# Patient Record
Sex: Male | Born: 1965
Health system: Southern US, Community
[De-identification: ages and names within clinical notes are randomized; demographics above are authoritative.]

## PROBLEM LIST (undated history)

## (undated) DIAGNOSIS — Z21 Asymptomatic human immunodeficiency virus [HIV] infection status: Secondary | ICD-10-CM

## (undated) DIAGNOSIS — J45909 Unspecified asthma, uncomplicated: Secondary | ICD-10-CM

## (undated) DIAGNOSIS — E785 Hyperlipidemia, unspecified: Secondary | ICD-10-CM

## (undated) DIAGNOSIS — E039 Hypothyroidism, unspecified: Secondary | ICD-10-CM

## (undated) DIAGNOSIS — I5032 Chronic diastolic (congestive) heart failure: Secondary | ICD-10-CM

## (undated) DIAGNOSIS — M1990 Unspecified osteoarthritis, unspecified site: Secondary | ICD-10-CM

## (undated) DIAGNOSIS — R011 Cardiac murmur, unspecified: Secondary | ICD-10-CM

## (undated) DIAGNOSIS — I340 Nonrheumatic mitral (valve) insufficiency: Secondary | ICD-10-CM

## (undated) DIAGNOSIS — G4733 Obstructive sleep apnea (adult) (pediatric): Secondary | ICD-10-CM

## (undated) DIAGNOSIS — I499 Cardiac arrhythmia, unspecified: Secondary | ICD-10-CM

## (undated) DIAGNOSIS — C801 Malignant (primary) neoplasm, unspecified: Secondary | ICD-10-CM

## (undated) DIAGNOSIS — K219 Gastro-esophageal reflux disease without esophagitis: Secondary | ICD-10-CM

## (undated) DIAGNOSIS — G629 Polyneuropathy, unspecified: Secondary | ICD-10-CM

## (undated) DIAGNOSIS — R112 Nausea with vomiting, unspecified: Secondary | ICD-10-CM

## (undated) DIAGNOSIS — I639 Cerebral infarction, unspecified: Secondary | ICD-10-CM

## (undated) DIAGNOSIS — I110 Hypertensive heart disease with heart failure: Secondary | ICD-10-CM

## (undated) DIAGNOSIS — K209 Esophagitis, unspecified without bleeding: Secondary | ICD-10-CM

## (undated) DIAGNOSIS — M109 Gout, unspecified: Secondary | ICD-10-CM

## (undated) DIAGNOSIS — I10 Essential (primary) hypertension: Secondary | ICD-10-CM

## (undated) DIAGNOSIS — Z9289 Personal history of other medical treatment: Secondary | ICD-10-CM

## (undated) DIAGNOSIS — Z9889 Other specified postprocedural states: Secondary | ICD-10-CM

## (undated) DIAGNOSIS — B20 Human immunodeficiency virus [HIV] disease: Secondary | ICD-10-CM

## (undated) DIAGNOSIS — D649 Anemia, unspecified: Secondary | ICD-10-CM

## (undated) DIAGNOSIS — K759 Inflammatory liver disease, unspecified: Secondary | ICD-10-CM

## (undated) DIAGNOSIS — Z992 Dependence on renal dialysis: Secondary | ICD-10-CM

## (undated) DIAGNOSIS — E079 Disorder of thyroid, unspecified: Secondary | ICD-10-CM

## (undated) DIAGNOSIS — Z972 Presence of dental prosthetic device (complete) (partial): Secondary | ICD-10-CM

## (undated) DIAGNOSIS — I219 Acute myocardial infarction, unspecified: Secondary | ICD-10-CM

## (undated) DIAGNOSIS — Z973 Presence of spectacles and contact lenses: Secondary | ICD-10-CM

## (undated) DIAGNOSIS — I700 Atherosclerosis of aorta: Secondary | ICD-10-CM

## (undated) DIAGNOSIS — N186 End stage renal disease: Secondary | ICD-10-CM

## (undated) DIAGNOSIS — N189 Chronic kidney disease, unspecified: Secondary | ICD-10-CM

## (undated) DIAGNOSIS — G4730 Sleep apnea, unspecified: Secondary | ICD-10-CM

## (undated) HISTORY — DX: Gastro-esophageal reflux disease without esophagitis: K21.9

## (undated) HISTORY — DX: Cardiac murmur, unspecified: R01.1

## (undated) HISTORY — PX: NOSE SURGERY: SHX723

## (undated) HISTORY — PX: HERNIA REPAIR: SHX51

## (undated) HISTORY — DX: Personal history of other medical treatment: Z92.89

## (undated) HISTORY — PX: MULTIPLE TOOTH EXTRACTIONS: SHX2053

## (undated) HISTORY — DX: Other specified postprocedural states: Z98.890

## (undated) HISTORY — PX: CHOLECYSTECTOMY: SHX55

## (undated) HISTORY — DX: Chronic diastolic (congestive) heart failure: I50.32

## (undated) HISTORY — DX: Nonrheumatic mitral (valve) insufficiency: I34.0

## (undated) HISTORY — DX: Unspecified asthma, uncomplicated: J45.909

## (undated) HISTORY — DX: Sleep apnea, unspecified: G47.30

## (undated) HISTORY — PX: RENAL BIOPSY: SHX156

## (undated) HISTORY — PX: COLONOSCOPY W/ BIOPSIES AND POLYPECTOMY: SHX1376

## (undated) HISTORY — DX: Essential (primary) hypertension: I10

## (undated) HISTORY — DX: Anemia, unspecified: D64.9

## (undated) HISTORY — DX: Atherosclerosis of aorta: I70.0

## (undated) HISTORY — DX: Hypertensive heart disease with heart failure: I11.0

## (undated) HISTORY — DX: Malignant (primary) neoplasm, unspecified: C80.1

## (undated) HISTORY — DX: Esophagitis, unspecified without bleeding: K20.90

## (undated) HISTORY — DX: Unspecified osteoarthritis, unspecified site: M19.90

## (undated) HISTORY — DX: Nausea with vomiting, unspecified: R11.2

## (undated) HISTORY — DX: Disorder of thyroid, unspecified: E07.9

---

## 2015-05-04 DIAGNOSIS — R9431 Abnormal electrocardiogram [ECG] [EKG]: Secondary | ICD-10-CM | POA: Insufficient documentation

## 2015-05-04 DIAGNOSIS — I340 Nonrheumatic mitral (valve) insufficiency: Secondary | ICD-10-CM | POA: Insufficient documentation

## 2015-05-04 DIAGNOSIS — I10 Essential (primary) hypertension: Secondary | ICD-10-CM | POA: Insufficient documentation

## 2015-05-04 DIAGNOSIS — B20 Human immunodeficiency virus [HIV] disease: Secondary | ICD-10-CM | POA: Insufficient documentation

## 2015-05-08 DIAGNOSIS — I2721 Secondary pulmonary arterial hypertension: Secondary | ICD-10-CM | POA: Insufficient documentation

## 2015-05-15 DIAGNOSIS — I5030 Unspecified diastolic (congestive) heart failure: Secondary | ICD-10-CM | POA: Insufficient documentation

## 2015-05-29 DIAGNOSIS — M549 Dorsalgia, unspecified: Secondary | ICD-10-CM | POA: Insufficient documentation

## 2015-05-29 DIAGNOSIS — M545 Low back pain, unspecified: Secondary | ICD-10-CM | POA: Insufficient documentation

## 2016-05-14 DIAGNOSIS — N186 End stage renal disease: Secondary | ICD-10-CM | POA: Insufficient documentation

## 2016-10-09 ENCOUNTER — Observation Stay (HOSPITAL_COMMUNITY): Payer: Medicaid Other

## 2016-10-09 ENCOUNTER — Inpatient Hospital Stay (HOSPITAL_COMMUNITY)
Admission: AD | Admit: 2016-10-09 | Discharge: 2016-10-11 | DRG: 304 | Disposition: A | Discharge status: Home / Self Care | Payer: Medicaid Other | Source: Other Acute Inpatient Hospital | Attending: Internal Medicine | Admitting: Internal Medicine

## 2016-10-09 ENCOUNTER — Encounter (HOSPITAL_COMMUNITY): Payer: Self-pay | Admitting: Internal Medicine

## 2016-10-09 DIAGNOSIS — Z23 Encounter for immunization: Secondary | ICD-10-CM | POA: Diagnosis not present

## 2016-10-09 DIAGNOSIS — I43 Cardiomyopathy in diseases classified elsewhere: Secondary | ICD-10-CM | POA: Diagnosis present

## 2016-10-09 DIAGNOSIS — Z87891 Personal history of nicotine dependence: Secondary | ICD-10-CM

## 2016-10-09 DIAGNOSIS — Z7982 Long term (current) use of aspirin: Secondary | ICD-10-CM | POA: Diagnosis not present

## 2016-10-09 DIAGNOSIS — I129 Hypertensive chronic kidney disease with stage 1 through stage 4 chronic kidney disease, or unspecified chronic kidney disease: Secondary | ICD-10-CM

## 2016-10-09 DIAGNOSIS — Z79899 Other long term (current) drug therapy: Secondary | ICD-10-CM | POA: Diagnosis not present

## 2016-10-09 DIAGNOSIS — I160 Hypertensive urgency: Principal | ICD-10-CM | POA: Diagnosis present

## 2016-10-09 DIAGNOSIS — R079 Chest pain, unspecified: Secondary | ICD-10-CM | POA: Diagnosis present

## 2016-10-09 DIAGNOSIS — B20 Human immunodeficiency virus [HIV] disease: Secondary | ICD-10-CM | POA: Diagnosis not present

## 2016-10-09 DIAGNOSIS — I5033 Acute on chronic diastolic (congestive) heart failure: Secondary | ICD-10-CM | POA: Diagnosis not present

## 2016-10-09 DIAGNOSIS — G4733 Obstructive sleep apnea (adult) (pediatric): Secondary | ICD-10-CM | POA: Diagnosis present

## 2016-10-09 DIAGNOSIS — I208 Other forms of angina pectoris: Secondary | ICD-10-CM

## 2016-10-09 DIAGNOSIS — I340 Nonrheumatic mitral (valve) insufficiency: Secondary | ICD-10-CM | POA: Diagnosis not present

## 2016-10-09 DIAGNOSIS — E876 Hypokalemia: Secondary | ICD-10-CM | POA: Diagnosis present

## 2016-10-09 DIAGNOSIS — R0781 Pleurodynia: Secondary | ICD-10-CM | POA: Diagnosis present

## 2016-10-09 DIAGNOSIS — E785 Hyperlipidemia, unspecified: Secondary | ICD-10-CM | POA: Diagnosis present

## 2016-10-09 DIAGNOSIS — N184 Chronic kidney disease, stage 4 (severe): Secondary | ICD-10-CM | POA: Diagnosis not present

## 2016-10-09 DIAGNOSIS — N179 Acute kidney failure, unspecified: Secondary | ICD-10-CM | POA: Diagnosis present

## 2016-10-09 DIAGNOSIS — I10 Essential (primary) hypertension: Secondary | ICD-10-CM

## 2016-10-09 DIAGNOSIS — I509 Heart failure, unspecified: Secondary | ICD-10-CM

## 2016-10-09 DIAGNOSIS — I130 Hypertensive heart and chronic kidney disease with heart failure and stage 1 through stage 4 chronic kidney disease, or unspecified chronic kidney disease: Secondary | ICD-10-CM | POA: Diagnosis present

## 2016-10-09 DIAGNOSIS — I110 Hypertensive heart disease with heart failure: Secondary | ICD-10-CM | POA: Diagnosis present

## 2016-10-09 HISTORY — DX: Hyperlipidemia, unspecified: E78.5

## 2016-10-09 HISTORY — DX: Hypertensive heart disease with heart failure: I11.0

## 2016-10-09 HISTORY — DX: Asymptomatic human immunodeficiency virus (hiv) infection status: Z21

## 2016-10-09 HISTORY — DX: Obstructive sleep apnea (adult) (pediatric): G47.33

## 2016-10-09 HISTORY — DX: Gout, unspecified: M10.9

## 2016-10-09 HISTORY — DX: Chronic kidney disease, unspecified: N18.9

## 2016-10-09 HISTORY — DX: Human immunodeficiency virus (HIV) disease: B20

## 2016-10-09 LAB — BASIC METABOLIC PANEL
Anion gap: 9 (ref 5–15)
BUN: 23 mg/dL — AB (ref 6–20)
CO2: 32 mmol/L (ref 22–32)
CREATININE: 2.45 mg/dL — AB (ref 0.61–1.24)
Calcium: 8.9 mg/dL (ref 8.9–10.3)
Chloride: 99 mmol/L — ABNORMAL LOW (ref 101–111)
GFR calc Af Amer: 34 mL/min — ABNORMAL LOW (ref >60)
GFR calc non Af Amer: 29 mL/min — ABNORMAL LOW (ref >60)
GLUCOSE: 96 mg/dL (ref 65–99)
Potassium: 2.7 mmol/L — CL (ref 3.5–5.1)
Sodium: 140 mmol/L (ref 135–145)

## 2016-10-09 LAB — CBC
HEMATOCRIT: 35.9 % — AB (ref 39.0–52.0)
HEMOGLOBIN: 12.6 g/dL — AB (ref 13.0–17.0)
MCH: 31 pg (ref 26.0–34.0)
MCHC: 35.1 g/dL (ref 30.0–36.0)
MCV: 88.2 fL (ref 78.0–100.0)
Platelets: 195 10*3/uL (ref 150–400)
RBC: 4.07 MIL/uL — ABNORMAL LOW (ref 4.22–5.81)
RDW: 13.8 % (ref 11.5–15.5)
WBC: 5 10*3/uL (ref 4.0–10.5)

## 2016-10-09 LAB — TROPONIN I
TROPONIN I: 0.13 ng/mL — AB (ref <0.03)
TROPONIN I: 0.11 ng/mL — AB (ref <0.03)
Troponin I: 0.11 ng/mL (ref <0.03)

## 2016-10-09 LAB — LIPID PANEL
CHOLESTEROL: 161 mg/dL (ref 0–200)
HDL: 56 mg/dL (ref >40)
LDL CALC: 85 mg/dL (ref 0–99)
TRIGLYCERIDES: 99 mg/dL (ref <150)
Total CHOL/HDL Ratio: 2.9 RATIO
VLDL: 20 mg/dL (ref 0–40)

## 2016-10-09 LAB — BRAIN NATRIURETIC PEPTIDE: B Natriuretic Peptide: 454.6 pg/mL — ABNORMAL HIGH (ref 0.0–100.0)

## 2016-10-09 LAB — D-DIMER, QUANTITATIVE: D-Dimer, Quant: 0.36 ug/mL-FEU (ref 0.00–0.50)

## 2016-10-09 LAB — ECHOCARDIOGRAM COMPLETE
Height: 69 in
Weight: 3428.59 oz

## 2016-10-09 LAB — MRSA PCR SCREENING: MRSA BY PCR: NEGATIVE

## 2016-10-09 MED ORDER — HYDRALAZINE HCL 50 MG PO TABS
100.0000 mg | ORAL_TABLET | Freq: Three times a day (TID) | ORAL | Status: DC
  Administered 2016-10-09 – 2016-10-11 (×7): 100 mg via ORAL
  Filled 2016-10-09 (×7): qty 2

## 2016-10-09 MED ORDER — LAMIVUDINE 150 MG PO TABS
150.0000 mg | ORAL_TABLET | Freq: Every day | ORAL | Status: DC
  Administered 2016-10-09 – 2016-10-11 (×3): 150 mg via ORAL
  Filled 2016-10-09 (×3): qty 1

## 2016-10-09 MED ORDER — ABACAVIR SULFATE 300 MG PO TABS
300.0000 mg | ORAL_TABLET | Freq: Every day | ORAL | Status: DC
  Administered 2016-10-09 – 2016-10-11 (×3): 300 mg via ORAL
  Filled 2016-10-09 (×3): qty 1

## 2016-10-09 MED ORDER — ACETAMINOPHEN 325 MG PO TABS
650.0000 mg | ORAL_TABLET | ORAL | Status: DC | PRN
  Administered 2016-10-09 – 2016-10-10 (×3): 650 mg via ORAL
  Filled 2016-10-09 (×3): qty 2

## 2016-10-09 MED ORDER — LOSARTAN POTASSIUM 50 MG PO TABS: 100.0000 mg | ORAL_TABLET | Freq: Every day | ORAL | Status: DC

## 2016-10-09 MED ORDER — CARVEDILOL 25 MG PO TABS
25.0000 mg | ORAL_TABLET | Freq: Two times a day (BID) | ORAL | Status: DC
  Administered 2016-10-09 – 2016-10-11 (×4): 25 mg via ORAL
  Filled 2016-10-09 (×4): qty 1

## 2016-10-09 MED ORDER — ATORVASTATIN CALCIUM 40 MG PO TABS
40.0000 mg | ORAL_TABLET | Freq: Every day | ORAL | Status: DC
  Administered 2016-10-09 – 2016-10-10 (×2): 40 mg via ORAL
  Filled 2016-10-09 (×2): qty 1

## 2016-10-09 MED ORDER — POTASSIUM CHLORIDE CRYS ER 20 MEQ PO TBCR
40.0000 meq | EXTENDED_RELEASE_TABLET | Freq: Once | ORAL | Status: AC
  Administered 2016-10-09 (×2): 20 meq via ORAL
  Filled 2016-10-09: qty 2

## 2016-10-09 MED ORDER — HEPARIN SODIUM (PORCINE) 5000 UNIT/ML IJ SOLN
5000.0000 [IU] | Freq: Three times a day (TID) | INTRAMUSCULAR | Status: DC
  Administered 2016-10-09 – 2016-10-10 (×4): 5000 [IU] via SUBCUTANEOUS
  Filled 2016-10-09 (×5): qty 1

## 2016-10-09 MED ORDER — INFLUENZA VAC SPLIT QUAD 0.5 ML IM SUSY
0.5000 mL | PREFILLED_SYRINGE | INTRAMUSCULAR | Status: AC
  Administered 2016-10-10: 0.5 mL via INTRAMUSCULAR
  Filled 2016-10-09: qty 0.5

## 2016-10-09 MED ORDER — ALUM & MAG HYDROXIDE-SIMETH 200-200-20 MG/5ML PO SUSP
30.0000 mL | Freq: Four times a day (QID) | ORAL | Status: DC | PRN
  Administered 2016-10-09: 30 mL via ORAL
  Filled 2016-10-09: qty 30

## 2016-10-09 MED ORDER — RALTEGRAVIR POTASSIUM 400 MG PO TABS
400.0000 mg | ORAL_TABLET | Freq: Two times a day (BID) | ORAL | Status: DC
  Administered 2016-10-09 – 2016-10-11 (×5): 400 mg via ORAL
  Filled 2016-10-09 (×5): qty 1

## 2016-10-09 MED ORDER — DIPHENHYDRAMINE HCL 25 MG PO CAPS
25.0000 mg | ORAL_CAPSULE | Freq: Every evening | ORAL | Status: DC | PRN
  Filled 2016-10-09: qty 1

## 2016-10-09 MED ORDER — MORPHINE SULFATE (PF) 2 MG/ML IV SOLN: 2.0000 mg | INTRAVENOUS | Status: DC | PRN

## 2016-10-09 MED ORDER — NITROGLYCERIN IN D5W 200-5 MCG/ML-% IV SOLN
0.0000 ug/min | INTRAVENOUS | Status: DC
  Administered 2016-10-09: 5 ug/min via INTRAVENOUS
  Filled 2016-10-09: qty 250

## 2016-10-09 MED ORDER — ONDANSETRON HCL 4 MG/2ML IJ SOLN: 4.0000 mg | Freq: Four times a day (QID) | INTRAMUSCULAR | Status: DC | PRN

## 2016-10-09 MED ORDER — POTASSIUM CHLORIDE CRYS ER 20 MEQ PO TBCR
40.0000 meq | EXTENDED_RELEASE_TABLET | ORAL | Status: AC
  Administered 2016-10-09 (×2): 40 meq via ORAL
  Filled 2016-10-09 (×2): qty 2

## 2016-10-09 MED ORDER — FUROSEMIDE 40 MG PO TABS
40.0000 mg | ORAL_TABLET | Freq: Two times a day (BID) | ORAL | Status: DC
  Administered 2016-10-09 – 2016-10-10 (×3): 40 mg via ORAL
  Filled 2016-10-09 (×3): qty 1

## 2016-10-09 MED ORDER — AMLODIPINE BESYLATE 10 MG PO TABS
10.0000 mg | ORAL_TABLET | Freq: Every day | ORAL | Status: DC
  Administered 2016-10-09 – 2016-10-11 (×3): 10 mg via ORAL
  Filled 2016-10-09 (×3): qty 1

## 2016-10-09 MED ORDER — ASPIRIN EC 325 MG PO TBEC
325.0000 mg | DELAYED_RELEASE_TABLET | Freq: Every day | ORAL | Status: DC
  Administered 2016-10-09 – 2016-10-11 (×3): 325 mg via ORAL
  Filled 2016-10-09 (×3): qty 1

## 2016-10-09 NOTE — H&P (Signed)
History and Physical    Albert Eaton AOZ:[id number] DOB: 02-22-1966 DOA: (Not on file)   PCP: No primary care provider on file. No PCP Chief Complaint: Chest Pain  HPI: Albert Eaton is a 50 y.o. male with medical history significant of CHF, HTN, HIV, HLD.  Chest pain onset on 12/12.  Presented to ED on 12/13 with persistent chest pain.  Associated SOB.  L sided CP with radiation to L shoulder.  Nothing specific made it better or worse.  Nitro paste also didn't really help.  Pain is currently 4/10.  ED Course: Trop 0.07.  EKG shows some ST segment depressions, no prior available for comparison.  Creat 2.73.  Does have known CKD, unclear how severe at baseline.  Review of Systems: As per HPI otherwise 10 point review of systems negative.    Past Medical History:  Diagnosis Date  . CHF (congestive heart failure) ([place])   . CKD (chronic kidney disease)   . Gout   . HIV (human immunodeficiency virus infection) ([place])   . HLD (hyperlipidemia)   . HTN (hypertension)   . OSA (obstructive sleep apnea)     Past Surgical History:  Procedure Laterality Date  . CHOLECYSTECTOMY    . HERNIA REPAIR     As baby     reports that he quit smoking about 17 years ago. He does not have any smokeless tobacco history on file. He reports that he drinks alcohol. He reports that he does not use drugs.  Allergies not on file  Family History  Problem Relation Age of Onset  . Heart failure Mother   . Heart attack Maternal Grandmother       Prior to Admission medications   Not on File    Physical Exam: Vitals:   10/09/16 0415 10/09/16 0456  BP: (!) 167/119 (!) 160/117  Pulse: 89   Temp: 98.7 F (37.1 C)   TempSrc: Oral   SpO2: 96%   Weight: 97.2 kg (214 lb 3.2 oz)   Height: 5' 10"  (1.778 m)      Constitutional: NAD, calm, comfortable Eyes: PERRL, lids and conjunctivae normal ENMT: Mucous membranes are moist. Posterior pharynx clear of any exudate or lesions.Normal  dentition.  Neck: normal, supple, no masses, no thyromegaly Respiratory: clear to auscultation bilaterally, no wheezing, no crackles. Normal respiratory effort. No accessory muscle use.  Cardiovascular: Regular rate and rhythm, no murmurs / rubs / gallops. No extremity edema. 2+ pedal pulses. No carotid bruits.  Abdomen: no tenderness, no masses palpated. No hepatosplenomegaly. Bowel sounds positive.  Musculoskeletal: no clubbing / cyanosis. No joint deformity upper and lower extremities. Good ROM, no contractures. Normal muscle tone.  Skin: no rashes, lesions, ulcers. No induration Neurologic: CN 2-12 grossly intact. Sensation intact, DTR normal. Strength 5/5 in all 4.  Psychiatric: Normal judgment and insight. Alert and oriented x 3. Normal mood.    Labs on Admission: I have personally reviewed following labs and imaging studies  CBC: No results for input(s): WBC, NEUTROABS, HGB, HCT, MCV, PLT in the last 168 hours. Basic Metabolic Panel: No results for input(s): NA, K, CL, CO2, GLUCOSE, BUN, CREATININE, CALCIUM, MG, PHOS in the last 168 hours. GFR: CrCl cannot be calculated (No order found.). Liver Function Tests: No results for input(s): AST, ALT, ALKPHOS, BILITOT, PROT, ALBUMIN in the last 168 hours. No results for input(s): LIPASE, AMYLASE in the last 168 hours. No results for input(s): AMMONIA in the last 168 hours. Coagulation Profile: No results for input(s):  INR, PROTIME in the last 168 hours. Cardiac Enzymes: No results for input(s): CKTOTAL, CKMB, CKMBINDEX, TROPONINI in the last 168 hours. BNP (last 3 results) No results for input(s): PROBNP in the last 8760 hours. HbA1C: No results for input(s): HGBA1C in the last 72 hours. CBG: No results for input(s): GLUCAP in the last 168 hours. Lipid Profile: No results for input(s): CHOL, HDL, LDLCALC, TRIG, CHOLHDL, LDLDIRECT in the last 72 hours. Thyroid Function Tests: No results for input(s): TSH, T4TOTAL, FREET4, T3FREE,  THYROIDAB in the last 72 hours. Anemia Panel: No results for input(s): VITAMINB12, FOLATE, FERRITIN, TIBC, IRON, RETICCTPCT in the last 72 hours. Urine analysis: No results found for: COLORURINE, APPEARANCEUR, LABSPEC, PHURINE, GLUCOSEU, HGBUR, BILIRUBINUR, KETONESUR, PROTEINUR, UROBILINOGEN, NITRITE, LEUKOCYTESUR Sepsis Labs: @LABRCNTIP (procalcitonin:4,lacticidven:4) )No results found for this or any previous visit (from the past 240 hour(s)).   Radiological Exams on Admission: No results found.  EKG: Independently reviewed.  Assessment/Plan Principal Problem:   Chest pain Active Problems:   HIV disease ([place])   CKD stage 4 secondary to hypertension ([place])   Malignant hypertension    1. Chest Pain - ischemic vs hypertensive urgency 1. CP obs pathway 2. NTG gtt for pain and BP control 3. NPO until trop comes back 4. Likely warrants cards eval in AM 5. Repeat EKG done 6. Getting a baseline 2d echo given both his reported history of CHF, and the fact that EKG today pretty clearly shows LVH.  (I assume patient has hypertensive cardiomyopathy, but we should confirm this). 2. HTN - 1. Continue BP meds 2. Except will hold losartan due to creat of 2.7 which puts his EGFR at 26 3. Nitro gtt 4. Despite not missing any BP meds last evening, his BP was as high as 199/136 at [facility], and diastolic remains high at 893 here. 3. CKD - appears to be stage 4 right now, from the sounds of what the patient tells me, this may in fact be baseline (actually he states "i thought my creatinine was even higher").  Presumably hypertensive nephropathy. 1. Hold losartan 2. Likely needs to get set up with nephrology 4. HIV - continue home meds   DVT prophylaxis: Heparin [place] Code Status: Full Family Communication: No family at bedside Consults called: None Admission status: Admit to obs   Nicie Milan, [facility] Pager [phone] from 7PM-7AM  If 7AM-7PM, please contact the day  physician for the patient [email or web address] Password [email or web address]  10/09/2016, 4:31 AM

## 2016-10-09 NOTE — Care Management Note (Addendum)
Case Management Note  Patient Details  Name: Albert Eaton MRN: [id number] Date of Birth: Dec 27, 1965  Subjective/Objective:   Pt presented for Chest Pain. Pt has been in [place] for 3 weeks. Previously patient was in [place]. Pt is without insurance and job at this time.                  Action/Plan: CM did place a call to the [facility] in regards to Hospital F/u. No appointments available. CM did reach out to Carmela Hurt in regards to trying to see if she could get an appointment. CM awaiting call back. CM will continue to monitor.   Expected Discharge Date:                  Expected Discharge Plan:  Home/Self Care  In-House Referral:  NA  Discharge planning Services  CM Consult, [facility]  Post Acute Care Choice:  NA Choice offered to:  NA  DME Arranged:  N/A DME Agency:  NA  HH Arranged:    [place] Agency:  NA  Status of Service:  Completed, signed off  If discussed at [place] of Stay Meetings, dates discussed:    Additional Comments: No appointments available @ this time @ the [facility].  [facility] and appointment available. CM will place appointment on AVS. Pt will be able to utilize the Pharmacy at the [facility]. Cost ranges from $4.00-$10.00. No further needs from CM at this time.  Bethena Roys, RN 10/09/2016, 3:35 PM

## 2016-10-09 NOTE — Progress Notes (Signed)
Patient seen and examined this morning, admitted overnight by Dr. Alcario Drought, agree with H&P and A/P   In brief, 50 y.o. male with medical history significant of CHF, HTN, HIV, HLD admitted 12/14 with chest pain.    Chest pain - ischemic vs hypertensive urgency, on nitro gtt - 2d echo pending - stress test per cardiology  - D dimer negative so unlikely PE  HTN - continue nitro, controlled this morning  CKD - unknown baseline, avoid nephrotoxic agents  HIV - continue home meds   Ramandeep Arington M. Cruzita Lederer, MD [facility] [phone]

## 2016-10-09 NOTE — Progress Notes (Signed)
RN spoke with Dr. Alcario Drought pertaining to current IV nitro rate, patient recent blood pressure and patient being chest pain free at this time.  Per Dr. Alcario Drought continue nitro at current rate, titrate for chest pain.

## 2016-10-09 NOTE — Progress Notes (Signed)
*  PRELIMINARY RESULTS* Echocardiogram 2D Echocardiogram has been performed.  Albert Eaton 10/09/2016, 12:23 PM

## 2016-10-09 NOTE — Progress Notes (Signed)
CRITICAL VALUE ALERT  Critical value received:  Potassium 2.7  Date of notification:  10/09/2016  Time of notification:  0705  Critical value read back:Yes.    Nurse who received alert:  B. Sallee Lange RN   MD notified (1st page):  C. Gherghe  Time of first page:  0707  Order entered per Dr. Cruzita Lederer at [phone] on 10/09/2013

## 2016-10-09 NOTE — Hospital Discharge Follow-Up (Signed)
[facility] [place]:  Received call from Jacqlyn Krauss, RN CM requesting hospital follow-up appointment for patient. Patient uninsured and does not have a PCP. However, there are no hospital follow-up appointments available at this time at [place] [facility]. Will continue to follow in case appointment becomes available. Jacqlyn Krauss, RN CM updated.

## 2016-10-09 NOTE — Consult Note (Signed)
CARDIOLOGY CONSULT NOTE   Patient ID: Albert Eaton MRN: [id number] DOB/AGE: 28-Dec-1965 50 y.o.  Admit date: 10/09/2016  Primary Physician   No primary care provider on file. Primary Cardiologist   New Reason for Consultation   Elevated troponin Requesting Physician  Dr. Cruzita Lederer  HPI: Albert Eaton is a 50 y.o. male with a history of CHF (nonischemic non-dilated cardiomyopathy with NYHA 2/C HF preserved EF 55% with moderate concentric hypertrophy and a grade 2 diastolic dysfunction. Moderate mitral valve insufficiency), CKD stage III-IV, HTN, HLD, OSA and HIV who presented for evaluation of chest pain.   Certainly moved to [place] from [place] at [place]. He has been followed at [facility], [place]. History of congestive heart failure, states that he has "strong heart".   Tuesday night while going to bed patient had a left upper chest. Described pain as the arm achy. Intermittently lasted all night however his pain was constant yesterday leading to ER presentation. Admits to having intermittent shortness of breath. His pain radiates to left shoulder. Exacerbated by moving head worse left shoulder. Recently noted worsening dyspnea on exertion. He was to having orthopnea but no PND or syncope. Some lower extremity edema. Denies abdominal pain, [place], blood in his stool or urine, palpitation, dizziness. He also had a chest 2 days ago.  Upon presentation his blood pressure was 167/119, calcium 2.7, creatinine 2.5, troponin I of 0.13. Hemoglobin 12.6. EKG shows sinus rhythm at rate of 81 bpm, LVH and nonspecific T-wave inversion in inferior lateral lead. He complains of 4 out of 10 left upper chest achiness there is excess breast by moving had 2 worse left shoulder.  Prior history of tobacco smoking. He smokes half a pack a day for 20 years. Quit in 2000. He drinks 2 glasses of liquor every day. Grandmother had massive heart attack at age 102. Mother had CHF.  Not  on any home medications.   Past Medical History:  Diagnosis Date  . CHF (congestive heart failure) ([place])   . CKD (chronic kidney disease)   . Gout   . HIV (human immunodeficiency virus infection) ([place])   . HLD (hyperlipidemia)   . HTN (hypertension)   . OSA (obstructive sleep apnea)      Past Surgical History:  Procedure Laterality Date  . CHOLECYSTECTOMY    . HERNIA REPAIR     As baby    No Known Allergies  I have reviewed the patient's current medications . abacavir  300 mg Oral Daily  . amLODipine  10 mg Oral Daily  . aspirin EC  325 mg Oral Daily  . atorvastatin  40 mg Oral q1800  . carvedilol  25 mg Oral BID WC  . furosemide  40 mg Oral BID  . heparin  5,000 Units Subcutaneous Q8H  . hydrALAZINE  100 mg Oral Q8H  . lamiVUDine  150 mg Oral Daily  . potassium chloride  40 mEq Oral Q2H  . raltegravir  400 mg Oral BID   . nitroGLYCERIN 5 mcg/min (10/09/16 0531)   acetaminophen, morphine injection, ondansetron (ZOFRAN) IV  Prior to Admission medications   Not on File     Social History   Social History  . Marital status: Unknown    Spouse name: N/A  . Number of children: N/A  . Years of education: N/A   Occupational History  . Not on file.   Social History Main Topics  . Smoking status: Former Smoker    Quit date: 2000  .  Smokeless tobacco: Former Systems developer    Quit date: 09/10/1999  . Alcohol use 1.2 oz/week    2 Shots of liquor per week     Comment: Light use  . Drug use: No  . Sexual activity: Not on file   Other Topics Concern  . Not on file   Social History Narrative  . No narrative on file    Family Status  Relation Status  . Mother   . Maternal Grandmother    Family History  Problem Relation Age of Onset  . Heart failure Mother   . Heart attack Maternal Grandmother      ROS:  Full 14 point review of systems complete and found to be negative unless listed above.  Physical Exam: Blood pressure (!) 160/126, pulse 89, temperature 98.7  F (37.1 C), temperature source Oral, height 5' 9"  (1.753 m), weight 214 lb 4.6 oz (97.2 kg), SpO2 96 %.  General: Well developed, well nourished, male in no acute distress Head: Eyes PERRLA, No xanthomas. Normocephalic and atraumatic, oropharynx without edema or exudate.  Lungs: Resp regular and unlabored, CTA. Faint bibasilar rales.  Heart: RRR no s3, s4, or murmurs..   Neck: No carotid bruits. No lymphadenopathy.  JVD difficult to assess due to girth. Abdomen: Bowel sounds present, abdomen soft and non-tender without masses or hernias noted. Msk:  No spine or cva tenderness. No weakness, no joint deformities or effusions. Extremities: No clubbing, cyanosis. Trace BL LE edema. DP/PT/Radials 2+ and equal bilaterally. Neuro: Alert and oriented X 3. No focal deficits noted. Psych:  Good affect, responds appropriately Skin: No rashes or lesions noted.  Labs:   Lab Results  Component Value Date   WBC 5.0 10/09/2016   HGB 12.6 (L) 10/09/2016   HCT 35.9 (L) 10/09/2016   MCV 88.2 10/09/2016   PLT 195 10/09/2016   No results for input(s): INR in the last 72 hours.  Recent Labs Lab 10/09/16 0536  NA 140  K 2.7*  CL 99*  CO2 32  BUN 23*  CREATININE 2.45*  CALCIUM 8.9  GLUCOSE 96   No results found for: MG  Recent Labs  10/09/16 0536  TROPONINI 0.13*    ECG:  Pending  [place] exercise stress test: 05/07/2015 @ [place] exercise stress test due to failure to achieve 85% of his maximal predicted heart rate because of his beta blocker therapy. However there was no evidence of ischemia at maximal exercise tolerance.Nuclear images are reported separately.  Exercise Nuclear Stress Test: 05/07/2015 @ [facility] Impression: 1.Normal exercise nuclear stress test revealing no evidence of ischemia or scar. 2.Abnormal gated wall motion study revealing moderate global hypokinesis. 3.Abnormal left ventricular ejection fraction of  42%.   TTE:  05/05/2015 @ [facility] Summary   There is no significant aortic stenosis or regurgitation.  There is mild aortic regurgitation.  No vegetations noted.  Mildly dilated left atrium.  Thrombus was not visualized within left atrium.  There is mild concentric left ventricular hypertrophy.  Left ventricular ejection fraction is estimated to be 55 %.  Normal regional wall motion noted.  The left ventricular function appears grossly normal.  E to A reversal suggesting decreased left ventricular compliance.  There is mild to moderate mitral regurgitation.  The mitral leaflet mobility is normal .  No evidence of pericardial effusion.  No evidence of pleural effusion.  Normal pulmonic valve structure and function.  Mild pulmonic regurgitation.  Normal right atrium.  Normal right ventricle structure and  function.  Mild tricuspid regurgitation.  Estimate of right ventricular systolic pressure is 55 mmHg.  The aortic root is normal in size.  US RENAL ARTERY STENOSIS HYPERTENSION COMPLETE: 05/07/2015 @ [facility]  IMPRESSION: No elevated velocities. However velocities of each renal artery are somewhat diminutive. The significance of this uncertain. Further evaluation with CTA or MRA of the renal vessels could be obtained for complete evaluation  Medical renal disease with echogenic kidneys  Hypoechoic region within the left upper quadrant measuring 4.5 x 2.9 x 3.7 cm may represent a splenule, however correlation with CT abdomen and pelvis with contrast is recommended.  The attending radiologist has reviewed all images, agrees with the interpretation, and provided appropriate supervision for this Exam.   Radiology:  No results found.  ASSESSMENT AND PLAN:      1. Chest pain - In setting of uncontrolled HTN and elevated Scr to 2.45. Constant pain for > 24 hours which exacerbated by moving head. EKG with non specific TWI insetting of LVH. On nitro gtt --> no  improvement in chest pain and BP. Troponin 0.13. Continue cycle.  Pending echo to assess LV function. Stress test Inpatient vs outpatient, will discussed with MD. Keep NPO for now.   - Exercise nuclear stress test 04/2015 @ [facility] showed no evidence of ischemia or scar. Abnormal gated wall motion study revealing moderate global Hypokinesis. Abnormal left ventricular ejection fraction of 42%. However Ef was 55% on echo without WM abnormality.   2. Uncontrolled HTN - elevated at presentation. No improvement. ? He is taking his meds. Continue Norvasc 37m, coreg 271mBID, hydralazine 1030mID and nitro gtt for now.  - no renal artery stenosis on renal [place]2016.   3. CHF, unknown type - likely diastolic given LVH and body habituate. Pending echo. Mild volume overload. Continue lasix 45m61m BID for now. Follow renal function closely. Will get BNP.   [facility] of nonischemic non-dilated cardiomyopathy with NYHA 2/C HF preserved EF 55% with moderate concentric hypertrophy and a grade 2 diastolic dysfunction. Moderate mitral valve insufficiency. - Echo and stress test as above.   4. HLD - On lipitor 45mg56mill get Lipid panel.   5. CKD stage 4 secondary to hypertension (HCC) [place]. Follow [place].  6. HIV - per primary   7. Hypokalemia - on supplement per primary    Signed: Bhagat,Bhavinkumar, PA 10/09/2016, 7:47 AM Pager [phone]  Co-Sign MD  Patient seen, examined. Available data reviewed. Agree with findings, assessment, and plan as outlined by [name] BRobbie LisC. The patient is independently interviewed and examined. JVP is elevated, carotids are normal without bruits, heart is regular rate and rhythm without murmur or gallop, abdomen is soft and nontender, extremities with trace bilateral edema.  The patient presents with chest pain, typical and atypical features. Troponin is mildly elevated in the context of hypertensive  urgency and chronic kidney disease. I have reviewed his outside records which includes cardiac testing performed in 2016. His echocardiogram and nuclear stress tests are both reviewed as outlined in the history of present illness above. Will update an echocardiogram. Consider repeat stress testing tomorrow once his blood pressure is controlled. States that his blood pressure has been up and down over the years. He is on an aggressive regimen at home and reports compliance with his medications. He stopped isosorbide in October because of headaches. He otherwise has been taking carvedilol, losartan, furosemide, and hydralazine. Will follow-up after his echo  is completed and anticipate an exercise nuclear scan tomorrow morning.  [name], M.D. 10/09/2016 10:11 AM

## 2016-10-10 ENCOUNTER — Inpatient Hospital Stay (HOSPITAL_COMMUNITY): Payer: Medicaid Other

## 2016-10-10 DIAGNOSIS — R079 Chest pain, unspecified: Secondary | ICD-10-CM

## 2016-10-10 LAB — CBC
HCT: 33.7 % — ABNORMAL LOW (ref 39.0–52.0)
HEMOGLOBIN: 11.8 g/dL — AB (ref 13.0–17.0)
MCH: 30.6 pg (ref 26.0–34.0)
MCHC: 35 g/dL (ref 30.0–36.0)
MCV: 87.3 fL (ref 78.0–100.0)
PLATELETS: 198 10*3/uL (ref 150–400)
RBC: 3.86 MIL/uL — AB (ref 4.22–5.81)
RDW: 13.6 % (ref 11.5–15.5)
WBC: 6.6 10*3/uL (ref 4.0–10.5)

## 2016-10-10 LAB — BASIC METABOLIC PANEL
Anion gap: 7 (ref 5–15)
BUN: 22 mg/dL — AB (ref 6–20)
CHLORIDE: 102 mmol/L (ref 101–111)
CO2: 26 mmol/L (ref 22–32)
CREATININE: 2.74 mg/dL — AB (ref 0.61–1.24)
Calcium: 8.9 mg/dL (ref 8.9–10.3)
GFR calc Af Amer: 29 mL/min — ABNORMAL LOW (ref >60)
GFR, EST NON AFRICAN AMERICAN: 25 mL/min — AB (ref >60)
Glucose, Bld: 120 mg/dL — ABNORMAL HIGH (ref 65–99)
POTASSIUM: 3.3 mmol/L — AB (ref 3.5–5.1)
SODIUM: 135 mmol/L (ref 135–145)

## 2016-10-10 LAB — NM MYOCAR MULTI W/SPECT W/WALL MOTION / EF
CHL CUP RESTING HR STRESS: 82 {beats}/min
Peak HR: 101 {beats}/min

## 2016-10-10 MED ORDER — REGADENOSON 0.4 MG/5ML IV SOLN
INTRAVENOUS | Status: AC
  Administered 2016-10-10: 0.4 mg
  Filled 2016-10-10: qty 5

## 2016-10-10 MED ORDER — LABETALOL HCL 5 MG/ML IV SOLN: 10.0000 mg | INTRAVENOUS | Status: DC | PRN

## 2016-10-10 MED ORDER — HYDRALAZINE HCL 20 MG/ML IJ SOLN
10.0000 mg | INTRAMUSCULAR | Status: DC | PRN
  Administered 2016-10-10: 10 mg via INTRAVENOUS
  Filled 2016-10-10: qty 1

## 2016-10-10 MED ORDER — POTASSIUM CHLORIDE CRYS ER 20 MEQ PO TBCR
40.0000 meq | EXTENDED_RELEASE_TABLET | Freq: Once | ORAL | Status: AC
  Administered 2016-10-10: 40 meq via ORAL
  Filled 2016-10-10: qty 2

## 2016-10-10 MED ORDER — TECHNETIUM TC 99M TETROFOSMIN IV KIT
10.0000 | PACK | Freq: Once | INTRAVENOUS | Status: AC | PRN
  Administered 2016-10-10: 10 via INTRAVENOUS

## 2016-10-10 MED ORDER — TECHNETIUM TC 99M TETROFOSMIN IV KIT
30.0000 | PACK | Freq: Once | INTRAVENOUS | Status: AC | PRN
  Administered 2016-10-10: 30 via INTRAVENOUS

## 2016-10-10 NOTE — Progress Notes (Addendum)
Patient Name: Albert Eaton Date of Encounter: 10/10/2016  Primary Cardiologist: New to Dr. Tyrell Antonio Problem List     Principal Problem:   Chest pain Active Problems:   HIV disease ([place])   CKD stage 4 secondary to hypertension ([place])   Malignant hypertension   Chest pain with high risk for cardiac etiology    Subjective   Feeling well. No chest pain, sob or palpitations.   Inpatient Medications    Scheduled Meds: . abacavir  300 mg Oral Daily  . amLODipine  10 mg Oral Daily  . aspirin EC  325 mg Oral Daily  . atorvastatin  40 mg Oral q1800  . carvedilol  25 mg Oral BID WC  . furosemide  40 mg Oral BID  . heparin  5,000 Units Subcutaneous Q8H  . hydrALAZINE  100 mg Oral Q8H  . Influenza vac split quadrivalent PF  0.5 mL Intramuscular Tomorrow-1000  . lamiVUDine  150 mg Oral Daily  . raltegravir  400 mg Oral BID   Continuous Infusions:  PRN Meds: acetaminophen, alum & mag hydroxide-simeth, diphenhydrAMINE, morphine injection, ondansetron (ZOFRAN) IV   Vital Signs    Vitals:   10/10/16 0020 10/10/16 0400 10/10/16 0519 10/10/16 0754  BP: 125/89 (!) 142/107 (!) 139/97 118/83  Pulse: 86 86  86  Resp: 18 19  18   Temp: 98.8 F (37.1 C) 98.7 F (37.1 C)  97.9 F (36.6 C)  TempSrc:    Oral  SpO2: 97% 93%  96%  Weight:  214 lb 3.2 oz (97.2 kg)    Height:        Intake/Output Summary (Last 24 hours) at 10/10/16 0910 Last data filed at 10/10/16 0600  Gross per 24 hour  Intake              225 ml  Output              900 ml  Net             -675 ml   Filed Weights   10/09/16 0415 10/09/16 0650 10/10/16 0400  Weight: 214 lb 3.2 oz (97.2 kg) 214 lb 4.6 oz (97.2 kg) 214 lb 3.2 oz (97.2 kg)    Physical Exam    GEN: Well nourished, well developed, in no acute distress.  HEENT: Grossly normal.  Neck: Supple, no JVD, carotid bruits, or masses. Cardiac: RRR, no murmurs, rubs, or gallops. No clubbing, cyanosis, edema.  Radials/DP/PT 2+ and equal  bilaterally.  Respiratory:  Respirations regular and unlabored, clear to auscultation bilaterally. GI: Soft, nontender, nondistended, BS + x 4. MS: no deformity or atrophy. Skin: warm and dry, no rash. Neuro:  Strength and sensation are intact. Psych: AAOx3.  Normal affect.  Labs    CBC  Recent Labs  10/09/16 0536 10/10/16 0307  WBC 5.0 6.6  HGB 12.6* 11.8*  HCT 35.9* 33.7*  MCV 88.2 87.3  PLT 195 638   Basic Metabolic Panel  Recent Labs  10/09/16 0536 10/10/16 0307  NA 140 135  K 2.7* 3.3*  CL 99* 102  CO2 32 26  GLUCOSE 96 120*  BUN 23* 22*  CREATININE 2.45* 2.74*  CALCIUM 8.9 8.9   Liver Function Tests No results for input(s): AST, ALT, ALKPHOS, BILITOT, PROT, ALBUMIN in the last 72 hours. No results for input(s): LIPASE, AMYLASE in the last 72 hours. Cardiac Enzymes  Recent Labs  10/09/16 0536 10/09/16 0816 10/09/16 1140  TROPONINI 0.13* 0.11* 0.11*  BNP Invalid input(s): POCBNP D-Dimer  Recent Labs  10/09/16 0816  DDIMER 0.36   Hemoglobin A1C No results for input(s): HGBA1C in the last 72 hours. Fasting Lipid Panel  Recent Labs  10/09/16 0816  CHOL 161  HDL 56  LDLCALC 85  TRIG 99  CHOLHDL 2.9   Thyroid Function Tests No results for input(s): TSH, T4TOTAL, T3FREE, THYROIDAB in the last 72 hours.  Invalid input(s): FREET3  Telemetry    Sinus rhythm with PACS- Personally Reviewed  ECG    N/A  Radiology    No results found.  Cardiac Studies   Echo 10/10/16 Indications:      CHF - 428.0.  ------------------------------------------------------------------- History:   PMH:  CKD. HIV.  Angina pectoris.  Risk factors: Hypertension.  ------------------------------------------------------------------- Study Conclusions  - Left ventricle: The cavity size was mildly dilated. Wall   thickness was increased in a pattern of moderate LVH. Features   are consistent with a pseudonormal left ventricular filling   pattern,  with concomitant abnormal relaxation and increased   filling pressure (grade 2 diastolic dysfunction). - Mitral valve: There was mild regurgitation. - Left atrium: The atrium was moderately dilated.  Patient Profile     Albert Eaton is a 50 y.o. male with a history of CHF (nonischemic non-dilated cardiomyopathy with NYHA 2/C HF preserved EF 55% with moderate concentric hypertrophy and a grade 2 diastolic dysfunction. Moderate mitral valve insufficiency), CKD stage III-IV, HTN, HLD, OSA and HIV who presented for evaluation of chest pain.   Assessment & Plan    1.Chest pain -  In setting of uncontrolled HTN and elevated Scr to 2.45. Constant pain for > 24 hours which exacerbated by moving head. EKG with non specific TWI insetting of LVH. - Troponin flat trend. Chest pain resolved with improved blood pressure. Echo showed grade 2 DD with mild MR and and moderately dialated LA. No comment on LV function, suspect normal. Will get exercise myoview today.   2. Uncontrolled HTN - Improved to 118/83. Will discontinue nitro gtt.  Continue Coreg 26m BID (hold this morning for exercise myoview) hydralazine 1025mTID and Norvasc 1060md. Held home dose of losartan 100m65m due to AKI  3. Acute on chronic diastolic CHF - Echo showed grade 2 DD. No comment on LV function, suspect normal. BNP was minimally elevated to 454. Scr worsen to 2.74 from 2.45 on Iv lasix 40mg65m. He was taking lasix po 40mg 62mat home. Total diuresis of 875ml. 91ms euvolemic. Will discontinue IV lasix for now. Will discuss with MD regarding po dose.   4. CKD stage 4 secondary to hypertension ([facility]. Hold diuretics due to worsen renal function. Follow closely. As above.   5. HIV - per primary  6. OSA - had sleep study many years ago. He has not been using CPAP for the past one to one and half years due cough associated with CPAP. He has his CPAP machine with him. Likely refer to Dr. Turner Radford Paxarameter adjustment vs repeat sleep study. - discussed lifestyle changes in detains. He need to loose wight with regular exercise and heart healthy diet. Pt is willing to work on it.   7. Hypokalemia - Supplement per primary   Signed, Bhagat,Bhavinkumar, PA  10/10/2016, 9:10 AM   Patient seen, examined. Available data reviewed. Agree with findings, assessment, and plan as outlined by [name] BhaRobbie Lis The patient is independently interviewed and examined. JVP is normal. Lungs are clear bilaterally.  Heart is regular rate and rhythm without murmur or gallop. Extremities without edema. I have reviewed his echocardiogram from yesterday. Have reviewed lab work. Blood pressure is now under control. Await nuclear stress test result. As long as it is not a high risk study, I would anticipate ongoing medical therapy for hypertension, chronic kidney disease, and chronic diastolic heart failure. The patient likely has sequelae of long-standing severe hypertension with marked LVH on his echo and hypertensive nephrosclerosis. It would be best to avoid catheter angiography if at all possible considering his chronic kidney disease. The patient's chest pain has completely resolved.  [name], M.D. 10/10/2016 2:55 PM  Addendum: Nuclear stress test result reviewed stress test is abnormal but low risk with no major areas of ischemia. Patient appropriate for medical therapy as outlined above.  [name] 10/10/2016 4:18 PM

## 2016-10-10 NOTE — Progress Notes (Signed)
   Albert Eaton presented for a nuclear stress test today.  No immediate complications.  Stress imaging is pending at this time.  Charlie Pitter, PA-C 10/10/2016, 11:16 AM

## 2016-10-10 NOTE — Progress Notes (Signed)
Stress test result reviewed with patient and IM. IM plans to keep patient until tomorrow to trend renal function. Cardiology f/u scheduled with [name] Bhagat 10/24/16 at 9:30am.  Melina Copa PA-C

## 2016-10-10 NOTE — Progress Notes (Signed)
PROGRESS NOTE  Albert Eaton URK:[id number] DOB: [birth year] DOA: 10/09/2016 PCP: No primary care provider on file.   LOS: 1 day   Brief Narrative:  50 y.o.malewith medical history significant of CHF, HTN, HIV, HLD admitted 12/14 with chest pain and hypertensive urgency  Assessment & Plan: Principal Problem:   Chest pain Active Problems:   HIV disease ([place])   CKD stage 4 secondary to hypertension ([place])   Malignant hypertension   Chest pain with high risk for cardiac etiology   Chest pain - ischemic vs hypertensive urgency, on nitro gtt, wean off today  - 2d echo as below - stress test per cardiology today - D dimer negative so unlikely PE  HTN - continue nitro, controlled this morning - now on hydralazine, coreg, amlodipine - appreciate cardiology input  CKD IV - unknown baseline, avoid nephrotoxic agents, Cr. Slight bump to 2.7 today   HIV - continue home meds, patient and partner asking whether his current regimen contributed to #1, d/w Dr. Johnnye Sima, this is unlikely.  DVT prophylaxis: heparin Code Status: Full code Family Communication: d/w partner bedside Disposition Plan: home when ready   Consultants:   Cardiology   Procedures:   2D echo Study Conclusions - Left ventricle: The cavity size was mildly dilated. Wall thickness was increased in a pattern of moderate LVH. Features are consistent with a pseudonormal left ventricular filling pattern, with concomitant abnormal relaxation and increased filling pressure (grade 2 diastolic dysfunction). - Mitral valve: There was mild regurgitation. - Left atrium: The atrium was moderately dilated.  Antimicrobials:  None    Subjective: - no chest pain, shortness of breath, no abdominal pain, nausea or vomiting.   Objective: Vitals:   10/10/16 1109 10/10/16 1111 10/10/16 1113 10/10/16 1114  BP: (!) 161/104 (!) 142/97 (!) 143/101   Pulse: 97 (!) 101 97 96  Resp:      Temp:      TempSrc:      SpO2:       Weight:      Height:        Intake/Output Summary (Last 24 hours) at 10/10/16 1409 Last data filed at 10/10/16 0600  Gross per 24 hour  Intake              225 ml  Output              500 ml  Net             -275 ml   Filed Weights   10/09/16 0415 10/09/16 0650 10/10/16 0400  Weight: 97.2 kg (214 lb 3.2 oz) 97.2 kg (214 lb 4.6 oz) 97.2 kg (214 lb 3.2 oz)    Examination: Constitutional: NAD Vitals:   10/10/16 1109 10/10/16 1111 10/10/16 1113 10/10/16 1114  BP: (!) 161/104 (!) 142/97 (!) 143/101   Pulse: 97 (!) 101 97 96  Resp:      Temp:      TempSrc:      SpO2:      Weight:      Height:       Eyes: PERRL Respiratory: clear to auscultation bilaterally, no wheezing, no crackles.  Cardiovascular: Regular rate and rhythm, no murmurs / rubs / gallops.  Abdomen: no tenderness. Bowel sounds positive.  Musculoskeletal: no clubbing / cyanosis.  Neurologic: non focal    Data Reviewed: I have personally reviewed following labs and imaging studies  CBC:  Recent Labs Lab 10/09/16 0536 10/10/16 0307  WBC 5.0 6.6  HGB 12.6*  11.8*  HCT 35.9* 33.7*  MCV 88.2 87.3  PLT 195 536   Basic Metabolic Panel:  Recent Labs Lab 10/09/16 0536 10/10/16 0307  NA 140 135  K 2.7* 3.3*  CL 99* 102  CO2 32 26  GLUCOSE 96 120*  BUN 23* 22*  CREATININE 2.45* 2.74*  CALCIUM 8.9 8.9   Cardiac Enzymes:  Recent Labs Lab 10/09/16 0536 10/09/16 0816 10/09/16 1140  TROPONINI 0.13* 0.11* 0.11*   BNP (last 3 results) No results for input(s): PROBNP in the last 8760 hours. HbA1C: No results for input(s): HGBA1C in the last 72 hours. CBG: No results for input(s): GLUCAP in the last 168 hours. Lipid Profile:  Recent Labs  10/09/16 0816  CHOL 161  HDL 56  LDLCALC 85  TRIG 99  CHOLHDL 2.9   Thyroid Function Tests: No results for input(s): TSH, T4TOTAL, FREET4, T3FREE, THYROIDAB in the last 72 hours. Anemia Panel: No results for input(s): VITAMINB12, FOLATE, FERRITIN,  TIBC, IRON, RETICCTPCT in the last 72 hours. Urine analysis: No results found for: COLORURINE, APPEARANCEUR, LABSPEC, PHURINE, GLUCOSEU, HGBUR, BILIRUBINUR, KETONESUR, PROTEINUR, UROBILINOGEN, NITRITE, LEUKOCYTESUR Sepsis Labs: Invalid input(s): PROCALCITONIN, LACTICIDVEN  Recent Results (from the past 240 hour(s))  MRSA PCR Screening     Status: None   Collection Time: 10/09/16  5:21 AM  Result Value Ref Range Status   MRSA by PCR NEGATIVE NEGATIVE Final    Comment:        The GeneXpert MRSA Assay (FDA approved for NASAL specimens only), is one component of a comprehensive MRSA colonization surveillance program. It is not intended to diagnose MRSA infection nor to guide or monitor treatment for MRSA infections.     Radiology Studies: No results found.  Scheduled Meds: . abacavir  300 mg Oral Daily  . amLODipine  10 mg Oral Daily  . aspirin EC  325 mg Oral Daily  . atorvastatin  40 mg Oral q1800  . carvedilol  25 mg Oral BID WC  . heparin  5,000 Units Subcutaneous Q8H  . hydrALAZINE  100 mg Oral Q8H  . Influenza vac split quadrivalent PF  0.5 mL Intramuscular Tomorrow-1000  . lamiVUDine  150 mg Oral Daily  . raltegravir  400 mg Oral BID   Continuous Infusions:  Marzetta Board, MD, PhD [facility] Pager [phone]  If 7PM-7AM, please contact night-coverage [email or web address] Password [email or web address] 10/10/2016, 2:09 PM

## 2016-10-11 DIAGNOSIS — I209 Angina pectoris, unspecified: Secondary | ICD-10-CM

## 2016-10-11 LAB — BASIC METABOLIC PANEL
Anion gap: 9 (ref 5–15)
BUN: 21 mg/dL — ABNORMAL HIGH (ref 6–20)
CHLORIDE: 102 mmol/L (ref 101–111)
CO2: 25 mmol/L (ref 22–32)
CREATININE: 2.56 mg/dL — AB (ref 0.61–1.24)
Calcium: 9.1 mg/dL (ref 8.9–10.3)
GFR, EST AFRICAN AMERICAN: 32 mL/min — AB (ref >60)
GFR, EST NON AFRICAN AMERICAN: 28 mL/min — AB (ref >60)
Glucose, Bld: 106 mg/dL — ABNORMAL HIGH (ref 65–99)
POTASSIUM: 2.9 mmol/L — AB (ref 3.5–5.1)
SODIUM: 136 mmol/L (ref 135–145)

## 2016-10-11 MED ORDER — ABACAVIR SULFATE 300 MG PO TABS: 300.0000 mg | ORAL_TABLET | Freq: Every day | ORAL | 1 refills | Status: DC

## 2016-10-11 MED ORDER — LAMIVUDINE 150 MG PO TABS: 150.0000 mg | ORAL_TABLET | Freq: Every day | ORAL | 1 refills | Status: DC

## 2016-10-11 MED ORDER — POTASSIUM CHLORIDE CRYS ER 20 MEQ PO TBCR
40.0000 meq | EXTENDED_RELEASE_TABLET | ORAL | Status: AC
  Administered 2016-10-11 (×2): 40 meq via ORAL
  Filled 2016-10-11 (×2): qty 2

## 2016-10-11 MED ORDER — FUROSEMIDE 40 MG PO TABS: 40.0000 mg | ORAL_TABLET | Freq: Two times a day (BID) | ORAL | 1 refills | Status: DC

## 2016-10-11 MED ORDER — AMLODIPINE BESYLATE 10 MG PO TABS: 10.0000 mg | ORAL_TABLET | Freq: Every day | ORAL | 1 refills | Status: DC

## 2016-10-11 MED ORDER — RALTEGRAVIR POTASSIUM 400 MG PO TABS: 400.0000 mg | ORAL_TABLET | Freq: Two times a day (BID) | ORAL | 1 refills | Status: DC

## 2016-10-11 MED ORDER — CARVEDILOL 25 MG PO TABS: 25.0000 mg | ORAL_TABLET | Freq: Two times a day (BID) | ORAL | 1 refills | Status: DC

## 2016-10-11 MED ORDER — ATORVASTATIN CALCIUM 40 MG PO TABS: 40.0000 mg | ORAL_TABLET | Freq: Every day | ORAL | 1 refills | Status: DC

## 2016-10-11 MED ORDER — HYDRALAZINE HCL 100 MG PO TABS: 100.0000 mg | ORAL_TABLET | Freq: Three times a day (TID) | ORAL | 1 refills | Status: DC

## 2016-10-11 NOTE — Discharge Instructions (Signed)
Follow up as instructed with cardiology, internal medicine and nephrology.  Please get a complete blood count and chemistry panel checked by your Primary MD at your next visit, and again as instructed by your Primary MD. Please get your medications reviewed and adjusted by your Primary MD.  Please request your Primary MD to go over all Hospital Tests and Procedure/Radiological results at the follow up, please get all Hospital records sent to your Prim MD by signing hospital release before you go home.  If you had Pneumonia of Lung problems at the Hospital: Please get a 2 view Chest X ray done in 6-8 weeks after hospital discharge or sooner if instructed by your Primary MD.  If you have Congestive Heart Failure: Please call your Cardiologist or Primary MD anytime you have any of the following symptoms:  1) 3 pound weight gain in 24 hours or 5 pounds in 1 week  2) shortness of breath, with or without a dry hacking cough  3) swelling in the hands, feet or stomach  4) if you have to sleep on extra pillows at night in order to breathe  Follow cardiac low salt diet and 1.5 lit/day fluid restriction.  If you have diabetes Accuchecks 4 times/day, Once in AM empty stomach and then before each meal. Log in all results and show them to your primary doctor at your next visit. If any glucose reading is under 80 or above 300 call your primary MD immediately.  If you have Seizure/Convulsions/Epilepsy: Please do not drive, operate heavy machinery, participate in activities at heights or participate in high speed sports until you have seen by Primary MD or a Neurologist and advised to do so again.  If you had Gastrointestinal Bleeding: Please ask your Primary MD to check a complete blood count within one week of discharge or at your next visit. Your endoscopic/colonoscopic biopsies that are pending at the time of discharge, will also need to followed by your Primary MD.  Get Medicines reviewed and  adjusted. Please take all your medications with you for your next visit with your Primary MD  Please request your Primary MD to go over all hospital tests and procedure/radiological results at the follow up, please ask your Primary MD to get all Hospital records sent to his/her office.  If you experience worsening of your admission symptoms, develop shortness of breath, life threatening emergency, suicidal or homicidal thoughts you must seek medical attention immediately by calling 911 or calling your MD immediately  if symptoms less severe.  You must read complete instructions/literature along with all the possible adverse reactions/side effects for all the Medicines you take and that have been prescribed to you. Take any new Medicines after you have completely understood and accpet all the possible adverse reactions/side effects.   Do not drive or operate heavy machinery when taking Pain medications.   Do not take more than prescribed Pain, Sleep and Anxiety Medications  Special Instructions: If you have smoked or chewed Tobacco  in the last 2 yrs please stop smoking, stop any regular Alcohol  and or any Recreational drug use.  Wear Seat belts while driving.  Please note You were cared for by a hospitalist during your hospital stay. If you have any questions about your discharge medications or the care you received while you were in the hospital after you are discharged, you can call the unit and asked to speak with the hospitalist on call if the hospitalist that took care of you is not  available. Once you are discharged, your primary care physician will handle any further medical issues. Please note that NO REFILLS for any discharge medications will be authorized once you are discharged, as it is imperative that you return to your primary care physician (or establish a relationship with a primary care physician if you do not have one) for your aftercare needs so that they can reassess your need  for medications and monitor your lab values.  You can reach the hospitalist office at phone [phone] or fax [phone]   If you do not have a primary care physician, you can call [phone] for a physician referral.  Activity: As tolerated with Full fall precautions use walker/cane & assistance as needed  Diet: heart healthy  Disposition Home

## 2016-10-11 NOTE — Progress Notes (Signed)
Subjective:  Currently no chest pain today.  Blood pressure is coming back up but his diuretics have been on hold.  Renal function has stabilized and is a little bit better today.  Was asked by hospitalist to comment on blood pressure.  Objective:  Vital Signs in the last 24 hours: BP (!) 153/101 (BP Location: Right Arm)   Pulse 84   Temp 98.2 F (36.8 C) (Oral)   Resp 16   Ht 5' 9"  (1.753 m)   Wt 97.5 kg (215 lb)   SpO2 95%   BMI 31.75 kg/m   Physical Exam: Mildly obese black male in no acute distress Lungs:  Clear Cardiac:  Regular rhythm, normal S1 and S2, no S3 Extremities:  No edema present  Intake/Output from previous day: 12/15 0701 - 12/16 0700 In: 120 [P.O.:120] Out: 1450 [Urine:1450]  Weight Filed Weights   10/09/16 0650 10/10/16 0400 10/10/16 1619  Weight: 97.2 kg (214 lb 4.6 oz) 97.2 kg (214 lb 3.2 oz) 97.5 kg (215 lb)    Lab Results: Basic Metabolic Panel:  Recent Labs  10/10/16 0307 10/11/16 0229  NA 135 136  K 3.3* 2.9*  CL 102 102  CO2 26 25  GLUCOSE 120* 106*  BUN 22* 21*  CREATININE 2.74* 2.56*   CBC:  Recent Labs  10/09/16 0536 10/10/16 0307  WBC 5.0 6.6  HGB 12.6* 11.8*  HCT 35.9* 33.7*  MCV 88.2 87.3  PLT 195 198   Cardiac Enzymes: Troponin (Point of Care Test) No results for input(s): TROPIPOC in the last 72 hours. Cardiac Panel (last 3 results)  Recent Labs  10/09/16 0536 10/09/16 0816 10/09/16 1140  TROPONINI 0.13* 0.11* 0.11*    Telemetry: Sinus rhythm  Assessment/Plan:  1.  Severe hypertensive heart disease 2.  Chest pain with not high risk myocardial perfusion scan 3.  History of HIV 4.  Stage IV chronic kidney disease due to hypertension  Recommendations:  I would go back in restart his diuretics on discharge and closely follow his renal function.  Replete potassium.  He already has cardiology follow-up arranged prior to the end of the year.     Kerry Hough  MD  [facility]  10/11/2016, 8:46 AM

## 2016-10-11 NOTE — Discharge Summary (Signed)
Physician Discharge Summary  Albert Eaton YOY:[id number] DOB: Dec 03, 1965 DOA: 10/09/2016  PCP: No primary care provider on file.  Admit date: 10/09/2016 Discharge date: 10/11/2016  Admitted From: home Disposition:  home  Recommendations for Outpatient Follow-up:  1. Follow up with cardiology as scheduled 2. Establish care with Dr. Johnnye Eaton from [facility] in 2-4 weeks   Discharge Condition: stable CODE STATUS: Full Diet recommendation: heart healthy  HPI: Per Dr. Dellie Catholic Eaton is a 50 y.o. male with medical history significant of CHF, HTN, HIV, HLD.  Chest pain onset on 12/12.  Presented to ED on 12/13 with persistent chest pain.  Associated SOB.  L sided CP with radiation to L shoulder.  Nothing specific made it better or worse.  Nitro paste also didn't really help. Pain is currently 4/10. ED Course: Trop 0.07.  EKG shows some ST segment depressions, no prior available for comparison.  Creat 2.73.  Does have known CKD, unclear how severe at baseline.  Hospital Course: Discharge Diagnoses:  Principal Problem:   Chest pain Active Problems:   HIV disease ([place])   CKD stage 4 secondary to hypertension (HCC)   Malignant hypertension   Chest pain with high risk for cardiac etiology  Chest pain - ischemic vs hypertensive urgency, cardiology was consulted and have followed patient while hospitalized. His D dimer was negative so unlikely PE. He underwent a 2D echo which showed grade 2 DD. He also underwent a stress test which showed subtle small defect of mild severity in the apex, but overall risk was low (full read below) and deemed stable for home discharge HTN - now on hydralazine, coreg, amlodipine, and to resume his Lasix on discharge. Appreciate cardiology input CKD IV - unknown baseline, avoid nephrotoxic agents, Cr. Stable, he would like to establish care in town, information regarding [facility] given to patient HIV - continue home  meds, patient wants to establish care with Dr. Johnnye Eaton as his partner is seeing him also.    Discharge Instructions   Allergies as of 10/11/2016   No Known Allergies     Medication List    STOP taking these medications   losartan 100 MG tablet Commonly known as:  COZAAR     TAKE these medications   abacavir 300 MG tablet Commonly known as:  ZIAGEN Take 1 tablet (300 mg total) by mouth daily.   amLODipine 10 MG tablet Commonly known as:  NORVASC Take 1 tablet (10 mg total) by mouth daily.   atorvastatin 40 MG tablet Commonly known as:  LIPITOR Take 1 tablet (40 mg total) by mouth daily.   carvedilol 25 MG tablet Commonly known as:  COREG Take 1 tablet (25 mg total) by mouth 2 (two) times daily with a meal.   furosemide 40 MG tablet Commonly known as:  LASIX Take 1 tablet (40 mg total) by mouth 2 (two) times daily.   hydrALAZINE 100 MG tablet Commonly known as:  APRESOLINE Take 1 tablet (100 mg total) by mouth every 8 (eight) hours. What changed:  medication strength  how much to take  when to take this   lamiVUDine 150 MG tablet Commonly known as:  EPIVIR Take 1 tablet (150 mg total) by mouth daily.   Potassium 99 MG Tabs Take 198 mg by mouth 2 (two) times daily.   raltegravir 400 MG tablet Commonly known as:  ISENTRESS Take 1 tablet (400 mg total) by mouth 2 (two) times daily.   zinc  gluconate 50 MG tablet Take 100 mg by mouth daily.      Follow-up Information    [facility] Follow up on 10/31/2016.   Why:  @ 9:00 am for Hospital Follow UP with Cammie Sickle.  Contact information: [place] [address]       [facility] Follow up.   Why:  Pt will be able to utilize the pharmacy onsite for medications that range in cost from $4.00-$10.00 Contact information: [place] [address] [place], [place]  Follow up.   Specialty:  Cardiology Why:  Follow up with Robbie Lis, PA-C on 10/24/16 at 9:30am. [name] is one of the PAs that works with Dr. [name]. Contact information: [address] [place] [address] [phone]        Bobby Rumpf, MD. Schedule an appointment as soon as possible for a visit in 3 week(s).   Specialty:  Infectious Diseases Contact information: [place] [address] [phone]        [employer]. Call in 2 day(s).   Why:  to establish care Contact information: [place] [address] [phone]          No Known Allergies  Consultations:  Cardiology   Procedures/Studies:  2D echo  Study Conclusions - Left ventricle: The cavity size was mildly dilated. Wallthickness was increased in a pattern of moderate LVH. Featuresare consistent with a pseudonormal left ventricular fillingpattern, with concomitant abnormal relaxation and increasedfilling pressure (grade 2 diastolic dysfunction). - Mitral valve: There was mild regurgitation. - Left atrium: The atrium was moderately dilated.  Nm Myocar Multi W/spect W/wall Motion / Ef  Result Date: 10/10/2016  Slight accentuation of the ST/T wave abnormality in the inferolateral leads during infusion  There is a subtle small defect of mild severity present in the apex location. The defect is reversible and could represent a very small area of ischemia.  This is a low risk study.  The left ventricular ejection fraction is mildly decreased (45-54%).  Nuclear stress EF: 48%.      Subjective: - no chest pain, shortness of breath, no abdominal pain, nausea or vomiting.   Discharge Exam: Vitals:   10/11/16 0430 10/11/16 0732  BP: 128/82 (!) 153/101  Pulse: 79 84  Resp: 17 16  Temp: 99.3 F (37.4 C) 98.2 F (36.8 C)   Vitals:   10/10/16 2000 10/10/16 2342 10/11/16 0430 10/11/16 0732  BP: 135/85 135/88 128/82 (!) 153/101  Pulse: 91 87 79 84   Resp: 20 18 17 16   Temp: 99.4 F (37.4 C) 97.9 F (36.6 C) 99.3 F (37.4 C) 98.2 F (36.8 C)  TempSrc:    Oral  SpO2: 95% 97% 95% 95%  Weight:      Height:        General: Pt is alert, awake, not in acute distress Cardiovascular: RRR, S1/S2 +, no rubs, no gallops Respiratory: CTA bilaterally, no wheezing, no rhonchi Abdominal: Soft, NT, ND, bowel sounds + Extremities: no edema, no cyanosis    The results of significant diagnostics from this hospitalization (including imaging, microbiology, ancillary and laboratory) are listed below for reference.     Microbiology: Recent Results (from the past 240 hour(s))  MRSA PCR Screening     Status: None   Collection Time: 10/09/16  5:21 AM  Result Value Ref Range Status  MRSA by PCR NEGATIVE NEGATIVE Final    Comment:        The GeneXpert MRSA Assay (FDA approved for NASAL specimens only), is one component of a comprehensive MRSA colonization surveillance program. It is not intended to diagnose MRSA infection nor to guide or monitor treatment for MRSA infections.      Labs: BNP (last 3 results)  Recent Labs  10/09/16 0816  BNP 568.6*   Basic Metabolic Panel:  Recent Labs Lab 10/09/16 0536 10/10/16 0307 10/11/16 0229  NA 140 135 136  K 2.7* 3.3* 2.9*  CL 99* 102 102  CO2 32 26 25  GLUCOSE 96 120* 106*  BUN 23* 22* 21*  CREATININE 2.45* 2.74* 2.56*  CALCIUM 8.9 8.9 9.1   Liver Function Tests: No results for input(s): AST, ALT, ALKPHOS, BILITOT, PROT, ALBUMIN in the last 168 hours. No results for input(s): LIPASE, AMYLASE in the last 168 hours. No results for input(s): AMMONIA in the last 168 hours. CBC:  Recent Labs Lab 10/09/16 0536 10/10/16 0307  WBC 5.0 6.6  HGB 12.6* 11.8*  HCT 35.9* 33.7*  MCV 88.2 87.3  PLT 195 198   Cardiac Enzymes:  Recent Labs Lab 10/09/16 0536 10/09/16 0816 10/09/16 1140  TROPONINI 0.13* 0.11* 0.11*   BNP: Invalid input(s): POCBNP CBG: No results for  input(s): GLUCAP in the last 168 hours. D-Dimer  Recent Labs  10/09/16 0816  DDIMER 0.36   Hgb A1c No results for input(s): HGBA1C in the last 72 hours. Lipid Profile  Recent Labs  10/09/16 0816  CHOL 161  HDL 56  LDLCALC 85  TRIG 99  CHOLHDL 2.9   Thyroid function studies No results for input(s): TSH, T4TOTAL, T3FREE, THYROIDAB in the last 72 hours.  Invalid input(s): FREET3 Anemia work up No results for input(s): VITAMINB12, FOLATE, FERRITIN, TIBC, IRON, RETICCTPCT in the last 72 hours. Urinalysis No results found for: COLORURINE, APPEARANCEUR, [place], [place], [place], [place], [place], [place], PROTEINUR, UROBILINOGEN, NITRITE, LEUKOCYTESUR Sepsis Labs Invalid input(s): PROCALCITONIN,  WBC,  LACTICIDVEN Microbiology Recent Results (from the past 240 hour(s))  MRSA PCR Screening     Status: None   Collection Time: 10/09/16  5:21 AM  Result Value Ref Range Status   MRSA by PCR NEGATIVE NEGATIVE Final    Comment:        The GeneXpert MRSA Assay (FDA approved for NASAL specimens only), is one component of a comprehensive MRSA colonization surveillance program. It is not intended to diagnose MRSA infection nor to guide or monitor treatment for MRSA infections.      Time coordinating discharge: Over 30 minutes  SIGNED:  Marzetta Board, MD  [facility] 10/11/2016, 11:02 AM Pager [phone]  If 7PM-7AM, please contact night-coverage [email or web address] Password [email or web address]

## 2016-10-14 ENCOUNTER — Encounter: Payer: Self-pay | Admitting: Infectious Diseases

## 2016-10-15 ENCOUNTER — Encounter: Payer: Self-pay | Admitting: Infectious Diseases

## 2016-10-15 ENCOUNTER — Ambulatory Visit (INDEPENDENT_AMBULATORY_CARE_PROVIDER_SITE_OTHER): Payer: Self-pay | Admitting: Infectious Diseases

## 2016-10-15 VITALS — BP 178/115 | HR 101 | Temp 99.0°F | Ht 70.0 in | Wt 204.0 lb

## 2016-10-15 DIAGNOSIS — Z23 Encounter for immunization: Secondary | ICD-10-CM

## 2016-10-15 DIAGNOSIS — I10 Essential (primary) hypertension: Secondary | ICD-10-CM

## 2016-10-15 DIAGNOSIS — Z113 Encounter for screening for infections with a predominantly sexual mode of transmission: Secondary | ICD-10-CM

## 2016-10-15 DIAGNOSIS — I129 Hypertensive chronic kidney disease with stage 1 through stage 4 chronic kidney disease, or unspecified chronic kidney disease: Secondary | ICD-10-CM

## 2016-10-15 DIAGNOSIS — N184 Chronic kidney disease, stage 4 (severe): Secondary | ICD-10-CM

## 2016-10-15 DIAGNOSIS — Z79899 Other long term (current) drug therapy: Secondary | ICD-10-CM

## 2016-10-15 DIAGNOSIS — B20 Human immunodeficiency virus [HIV] disease: Secondary | ICD-10-CM

## 2016-10-15 LAB — LIPID PANEL
CHOL/HDL RATIO: 2.8 ratio (ref <5.0)
CHOLESTEROL: 136 mg/dL (ref <200)
HDL: 49 mg/dL (ref >40)
LDL Cholesterol: 68 mg/dL (ref <100)
Triglycerides: 94 mg/dL (ref <150)
VLDL: 19 mg/dL (ref <30)

## 2016-10-15 LAB — CBC
HEMATOCRIT: 39.3 % (ref 38.5–50.0)
HEMOGLOBIN: 13.4 g/dL (ref 13.2–17.1)
MCH: 30.5 pg (ref 27.0–33.0)
MCHC: 34.1 g/dL (ref 32.0–36.0)
MCV: 89.5 fL (ref 80.0–100.0)
MPV: 10.6 fL (ref 7.5–12.5)
Platelets: 257 10*3/uL (ref 140–400)
RBC: 4.39 MIL/uL (ref 4.20–5.80)
RDW: 15.3 % — AB (ref 11.0–15.0)
WBC: 5.9 10*3/uL (ref 3.8–10.8)

## 2016-10-15 LAB — COMPREHENSIVE METABOLIC PANEL
ALT: 15 U/L (ref 9–46)
AST: 14 U/L (ref 10–35)
Albumin: 4.2 g/dL (ref 3.6–5.1)
Alkaline Phosphatase: 54 U/L (ref 40–115)
BUN: 27 mg/dL — AB (ref 7–25)
CHLORIDE: 103 mmol/L (ref 98–110)
CO2: 24 mmol/L (ref 20–31)
CREATININE: 2.54 mg/dL — AB (ref 0.70–1.33)
Calcium: 9.7 mg/dL (ref 8.6–10.3)
GLUCOSE: 104 mg/dL — AB (ref 65–99)
POTASSIUM: 3.4 mmol/L — AB (ref 3.5–5.3)
SODIUM: 140 mmol/L (ref 135–146)
TOTAL PROTEIN: 7 g/dL (ref 6.1–8.1)
Total Bilirubin: 0.5 mg/dL (ref 0.2–1.2)

## 2016-10-15 NOTE — Assessment & Plan Note (Signed)
He has f/u on 12-29 with Dr [name].  Greatly appreciate his partnering with [place] Will see about getting pt a scale.

## 2016-10-15 NOTE — Assessment & Plan Note (Signed)
Has gotten flu shot.  Not clear of last [place] Never gotten mening that he knows.  Will send baseline screening labs Will see him back in 3 weeks Continue his current ART while we await genotype, maybe hard to change with his CrCl.

## 2016-10-15 NOTE — Progress Notes (Signed)
   Subjective:    Patient ID: Albert Eaton, male    DOB: 03/23/1966, 50 y.o.   MRN: [id number]  HPI 50 y.o. M with a history of CHF (nonischemic non-dilated CM with NYHA 2/C HF preserved EF 55% with moderate concentric hypertrophy and a grade 2 diastolic dysfunction. Moderate mitral valve insufficiency), CKD stage III-IV, HTN, HLD, OSA and HIV adm 12-14 with chest pain and hypertension.  He had stress test that was felt to be low risk and was managed medically. He was d/c home on 12-16.  He is here with his partner (also my pt).  Was dx HIV+ 1990. No HIV related hospitalizations. Has been on epivir/abacavir/issentress (has been on for 1.5 yrs). Dosed for his CrCl. Has been on prior ART but not clear if he had resistance.  He is unclear of his last CD4.   PMHx, Soc, Fhx, reviewed updated in EPIC.   Review of Systems  Constitutional: Negative for appetite change and unexpected weight change.  Respiratory: Positive for shortness of breath.        Chest pressure  Cardiovascular: Negative for leg swelling.  Gastrointestinal: Negative for constipation and diarrhea.  Genitourinary: Negative for difficulty urinating.  Neurological: Negative for headaches.   Does not have home scale. Wt is variable.     Objective:   Physical Exam  Constitutional: He appears well-developed and well-nourished.  HENT:  Mouth/Throat: No oropharyngeal exudate.  Eyes: EOM are normal. Pupils are equal, round, and reactive to light.  Neck: Neck supple.  Cardiovascular: Normal rate, regular rhythm and normal heart sounds.   Pulmonary/Chest: Effort normal and breath sounds normal.  Abdominal: Soft. Bowel sounds are normal. There is no tenderness. There is no rebound.  Musculoskeletal: He exhibits no edema.  Lymphadenopathy:    He has no cervical adenopathy.      Assessment & Plan:

## 2016-10-15 NOTE — Addendum Note (Signed)
Addended by: Roma Kayser on: 10/15/2016 10:34 AM   Modules accepted: Orders

## 2016-10-15 NOTE — Assessment & Plan Note (Signed)
Will make referral for [facility] appt.

## 2016-10-16 LAB — URINE CYTOLOGY ANCILLARY ONLY
Chlamydia: NEGATIVE
NEISSERIA GONORRHEA: NEGATIVE

## 2016-10-16 LAB — HEPATITIS B SURFACE ANTIBODY,QUALITATIVE: HEP B S AB: NEGATIVE

## 2016-10-16 LAB — T-HELPER CELL (CD4) - (RCID CLINIC ONLY)
CD4 % Helper T Cell: 29 % — ABNORMAL LOW (ref 33–55)
CD4 T Cell Abs: 600 /uL (ref 400–2700)

## 2016-10-16 LAB — HEPATITIS B SURF AG CONFIRMATION: Hepatitis B Surf Ag Confirmation: POSITIVE — AB

## 2016-10-16 LAB — RPR

## 2016-10-16 LAB — HEPATITIS A ANTIBODY, TOTAL: HEP A TOTAL AB: REACTIVE — AB

## 2016-10-16 LAB — HEPATITIS C ANTIBODY: HCV Ab: NEGATIVE

## 2016-10-16 LAB — HEPATITIS B SURFACE ANTIGEN: Hepatitis B Surface Ag: POSITIVE — AB

## 2016-10-17 LAB — HIV-1 RNA ULTRAQUANT REFLEX TO GENTYP+

## 2016-10-18 LAB — QUANTIFERON TB GOLD ASSAY (BLOOD)
INTERFERON GAMMA RELEASE ASSAY: NEGATIVE
Mitogen-Nil: >10 IU/mL
QUANTIFERON NIL VALUE: 0.02 [IU]/mL
Quantiferon Tb Ag Minus Nil Value: 0.01 IU/mL

## 2016-10-22 NOTE — Progress Notes (Signed)
Cardiology Office Note    Date:  10/24/2016   ID:  Albert Eaton, DOB 1966-10-19, MRN [id number]  PCP:  No primary care provider on file.  Cardiologist:  Dr. [name]  Chief Complaint: Hospital follow up for chest pain  History of Present Illness:   Albert Eaton is a 50 y.o. male with a history of nonischemic non-dilated cardiomyopathy, chronic diastolic CHF, CKD stage III-IV, HTN, HLD, OSA and HIV who recently admitted for chest pain presents for follow up.   Certainly moved to [place] from [place] at [place]. He has been followed at [facility], [place]. Exercise nuclear stress test 04/2015 @ [facility] showed no evidence of ischemia or scar. Abnormal gated wall motion study revealing moderate global Hypokinesis. Abnormal left ventricular ejection fraction of 42%. However Ef was 55% on echo without WM abnormality.    Admitted 12/17 for chest pain in setting of uncontrolled HTN and elevated Scr to 2.45. Constant pain for >24 hours which exacerbated by moving head. EKG with non specific TWI insetting of LVH. Troponin flat trend. Chest pain resolved with improved blood pressure. Echo showed grade 2 DD with mild MR and and moderately dialated LA. No comment on LV function, suspect normal. Stress test is abnormal but low risk with no major areas of ischemia. Recommended medical therapy. Avoid cath due to CKD. BNP was minimally elevated to 454. Diuretics held due to worsening renal function that resume at discharge. Avoid nephrotoxic agent.   Here today for follow up. Resolved chest pain. He complains of intermittent abdominal pain for the past 3 days. No nausea, vomiting, diarrhea, constipation or blood in his stool or urine. Liver function and serum creatinine were 12/20  normal. He has tried to use his old CPAP machine however unable to tolerate it. He went to make appointment at [facility] however states that he will need referral. Compliant with  medication. Appointment with PCP 11/01/15 to establish care. Had some shortness of breath this morning that has been resolved. Patient denies any orthopnea, PND, syncope, lower extremity edema, palpitations.  Past Medical History:  Diagnosis Date  . CHF (congestive heart failure) ([place])   . CKD (chronic kidney disease)   . Gout   . HIV (human immunodeficiency virus infection) ([place])   . HLD (hyperlipidemia)   . HTN (hypertension)   . OSA (obstructive sleep apnea)     Past Surgical History:  Procedure Laterality Date  . CHOLECYSTECTOMY    . HERNIA REPAIR     As baby    Current Medications: Prior to Admission medications   Medication Sig Start Date End Date Taking? Authorizing Provider  abacavir (ZIAGEN) 300 MG tablet Take 1 tablet (300 mg total) by mouth daily. 10/11/16   Costin Karlyne Greenspan, MD  amLODipine (NORVASC) 10 MG tablet Take 1 tablet (10 mg total) by mouth daily. 10/11/16   Costin Karlyne Greenspan, MD  atorvastatin (LIPITOR) 40 MG tablet Take 1 tablet (40 mg total) by mouth daily. 10/11/16   Costin Karlyne Greenspan, MD  carvedilol (COREG) 25 MG tablet Take 1 tablet (25 mg total) by mouth 2 (two) times daily with a meal. 10/11/16   Costin Karlyne Greenspan, MD  furosemide (LASIX) 40 MG tablet Take 1 tablet (40 mg total) by mouth 2 (two) times daily. 10/11/16   Costin Karlyne Greenspan, MD  hydrALAZINE (APRESOLINE) 100 MG tablet Take 1 tablet (100 mg total) by mouth every 8 (eight) hours. 10/11/16   Costin Karlyne Greenspan, MD  lamiVUDine (EPIVIR)  150 MG tablet Take 1 tablet (150 mg total) by mouth daily. 10/11/16   [place], MD  Potassium 99 MG TABS Take 198 mg by mouth 2 (two) times daily.    Historical Provider, MD  raltegravir (ISENTRESS) 400 MG tablet Take 1 tablet (400 mg total) by mouth 2 (two) times daily. 10/11/16   Costin Karlyne Greenspan, MD  zinc gluconate 50 MG tablet Take 100 mg by mouth daily.    Historical Provider, MD    Allergies:   Patient has no known allergies.   Social History   Social  History  . Marital status: Unknown    Spouse name: N/A  . Number of children: N/A  . Years of education: N/A   Social History Main Topics  . Smoking status: Former Smoker    Quit date: 2000  . Smokeless tobacco: Former Systems developer    Quit date: 09/10/1999  . Alcohol use 8.4 oz/week    14 Shots of liquor per week     Comment: Light use  . Drug use: No  . Sexual activity: Yes     Comment: declined condoms   Other Topics Concern  . None   Social History Narrative  . None     Family History:  The patient's family history includes Heart attack in his maternal grandmother; Heart failure in his mother; Hypertension in his brother and sister.   ROS:   Please see the history of present illness.    ROS All other systems reviewed and are negative.   PHYSICAL EXAM:   VS:  BP (!) 152/96   Pulse 71   Ht 5' 10"  (1.778 m)   Wt 210 lb (95.3 kg)   SpO2 98%   BMI 30.13 kg/m    GEN: Well nourished, well developed, in no acute distress  HEENT: normal  Neck: no JVD, carotid bruits, or masses Cardiac: RRR; no murmurs, rubs, or gallops,no edema  Respiratory:  clear to auscultation bilaterally, normal work of breathing GI: soft, nontender, nondistended, + BS MS: no deformity or atrophy  Skin: warm and dry, no rash Neuro:  Alert and Oriented x 3, Strength and sensation are intact Psych: euthymic mood, full affect  Wt Readings from Last 3 Encounters:  10/24/16 210 lb (95.3 kg)  10/15/16 204 lb (92.5 kg)  10/10/16 215 lb (97.5 kg)      Studies/Labs Reviewed:   EKG:  EKG is not ordered today.    Recent Labs: 10/09/2016: B Natriuretic Peptide 454.6 10/15/2016: ALT 15; BUN 27; Creat 2.54; Hemoglobin 13.4; Platelets 257; Potassium 3.4; Sodium 140   Lipid Panel    Component Value Date/Time   CHOL 136 10/15/2016 1011   TRIG 94 10/15/2016 1011   HDL 49 10/15/2016 1011   CHOLHDL 2.8 10/15/2016 1011   VLDL 19 10/15/2016 1011   LDLCALC 68 10/15/2016 1011    Additional studies/ records  that were reviewed today include:   [place] exercise stress test: 05/07/2015 @ [facility] exercise stress test due to failure to achieve 85% of his maximal predicted heart rate because of his beta blocker therapy. However there was no evidence of ischemia at maximal exercise tolerance.Nuclear images are reported separately.  Exercise Nuclear Stress Test: 05/07/2015 @ [facility] Impression: 1.Normal exercise nuclear stress test revealing no evidence of ischemia or scar. 2.Abnormal gated wall motion study revealing moderate global hypokinesis. 3.Abnormal left ventricular ejection fraction of 42%.   TTE:  05/05/2015 @ [facility] Summary   There is  no significant aortic stenosis or regurgitation.  There is mild aortic regurgitation.  No vegetations noted.  Mildly dilated left atrium.  Thrombus was not visualized within left atrium.  There is mild concentric left ventricular hypertrophy.  Left ventricular ejection fraction is estimated to be 55 %.  Normal regional wall motion noted.  The left ventricular function appears grossly normal.  E to A reversal suggesting decreased left ventricular compliance.  There is mild to moderate mitral regurgitation.  The mitral leaflet mobility is normal .  No evidence of pericardial effusion.  No evidence of pleural effusion.  Normal pulmonic valve structure and function.  Mild pulmonic regurgitation.  Normal right atrium.  Normal right ventricle structure and function.  Mild tricuspid regurgitation.  Estimate of right ventricular systolic pressure is 55 mmHg.  The aortic root is normal in size.  US RENAL ARTERY STENOSIS HYPERTENSION COMPLETE: 05/07/2015 @ [facility]  IMPRESSION: No elevated velocities. However velocities of each renal artery are somewhat diminutive. The significance of this uncertain. Further evaluation with CTA or MRA of the renal vessels could be obtained for  complete evaluation  Medical renal disease with echogenic kidneys  Hypoechoic region within the left upper quadrant measuring 4.5 x 2.9 x 3.7 cm may represent a splenule, however correlation with CT abdomen and pelvis with contrast is recommended.  The attending radiologist has reviewed all images, agrees with the interpretation, and provided appropriate supervision for this Exam.  Myoview 10/10/16  Slight accentuation of the ST/T wave abnormality in the inferolateral leads during infusion  There is a subtle small defect of mild severity present in the apex location. The defect is reversible and could represent a very small area of ischemia.  This is a low risk study.  The left ventricular ejection fraction is mildly decreased (45-54%).  Nuclear stress EF: 48%.  Echo 10/09/16 Study Conclusions  - Left ventricle: The cavity size was mildly dilated. Wall   thickness was increased in a pattern of moderate LVH. Features   are consistent with a pseudonormal left ventricular filling   pattern, with concomitant abnormal relaxation and increased   filling pressure (grade 2 diastolic dysfunction). - Mitral valve: There was mild regurgitation. - Left atrium: The atrium was moderately dilated. ASSESSMENT & PLAN:    1. HTN - Elevated in clinic today. Able to add ACE/ARB due to CKD. Will start him on Imdur 35m qd. Continue Norvasc10 mg once a day, Coreg 25 mg twice a day and hydralazine 100 mg 3 times a day. Keep long.   2. Chronic diastolic CHF - EF of 429%on stress test. Echo showed grade 2 DD, moderate LVH. No comment on LVEF. Euvolemic. Continue current dose of Lasix and potassium. His kidney function and electrolytes ( of 3.4 - Advise to increase intake of potassium containing food) were  stable during last p.m. at 12/20.   3. CKD stage IV - He [place].   4. OSA - Work on diet and exercise. He is unable to  tolerate current CPAP machine. Last sleep study was done in 2011/2012. No interested in repeat study currently to cost. He has applied for Medicaid/assistant program. He is willing to consider in future.    Medication Adjustments/Labs and Tests Ordered: Current medicines are reviewed at length with the patient today.  Concerns regarding medicines are outlined above.  Medication changes, Labs and Tests ordered today are listed in the Patient Instructions below. Patient Instructions  Medication  Instructions:  1. START IMDUR 30 MG DAILY; RX SENT TO [facility] [address]  Labwork: NONE  Testing/Procedures: NONE  Follow-Up: 1. YOU ARE BEING REFERRED TO [facility]  2. DR. [name] IN 3 MONTHS  Any Other Special Instructions Will Be Listed Below (If Applicable).     If you need a refill on your cardiac medications before your next appointment, please call your pharmacy.      Jarrett Soho, [place]  10/24/2016 10:36 AM    [facility] [place], [place], [place]  [address] Phone: [phone]; Fax: [phone]

## 2016-10-24 ENCOUNTER — Encounter: Payer: Self-pay | Admitting: Physician Assistant

## 2016-10-24 ENCOUNTER — Ambulatory Visit (INDEPENDENT_AMBULATORY_CARE_PROVIDER_SITE_OTHER): Payer: Self-pay | Admitting: Physician Assistant

## 2016-10-24 VITALS — BP 152/96 | HR 71 | Ht 70.0 in | Wt 210.0 lb

## 2016-10-24 DIAGNOSIS — G4733 Obstructive sleep apnea (adult) (pediatric): Secondary | ICD-10-CM

## 2016-10-24 DIAGNOSIS — N184 Chronic kidney disease, stage 4 (severe): Secondary | ICD-10-CM

## 2016-10-24 DIAGNOSIS — I5032 Chronic diastolic (congestive) heart failure: Secondary | ICD-10-CM

## 2016-10-24 DIAGNOSIS — I10 Essential (primary) hypertension: Secondary | ICD-10-CM

## 2016-10-24 DIAGNOSIS — I129 Hypertensive chronic kidney disease with stage 1 through stage 4 chronic kidney disease, or unspecified chronic kidney disease: Secondary | ICD-10-CM

## 2016-10-24 MED ORDER — ISOSORBIDE MONONITRATE ER 30 MG PO TB24: 30.0000 mg | ORAL_TABLET | Freq: Every day | ORAL | 3 refills | Status: DC

## 2016-10-24 NOTE — Patient Instructions (Signed)
Medication Instructions:  1. START IMDUR 30 MG DAILY; RX SENT TO [facility] [place]  Labwork: NONE  Testing/Procedures: NONE  Follow-Up: 1. YOU ARE BEING REFERRED TO [facility]  2. DR. [name] IN 3 MONTHS  Any Other Special Instructions Will Be Listed Below (If Applicable).     If you need a refill on your cardiac medications before your next appointment, please call your pharmacy.

## 2016-10-25 LAB — HLA B*5701: HLA-B 5701 W/RFLX HLA-B HIGH: NEGATIVE

## 2016-10-28 ENCOUNTER — Encounter: Payer: Self-pay | Admitting: *Deleted

## 2016-10-30 ENCOUNTER — Other Ambulatory Visit: Payer: Self-pay | Admitting: *Deleted

## 2016-10-30 DIAGNOSIS — B20 Human immunodeficiency virus [HIV] disease: Secondary | ICD-10-CM

## 2016-10-30 MED ORDER — LAMIVUDINE 150 MG PO TABS: 150.0000 mg | ORAL_TABLET | Freq: Every day | ORAL | 5 refills | Status: DC

## 2016-10-30 MED ORDER — RALTEGRAVIR POTASSIUM 400 MG PO TABS: 400.0000 mg | ORAL_TABLET | Freq: Two times a day (BID) | ORAL | 5 refills | Status: DC

## 2016-10-30 MED ORDER — ABACAVIR SULFATE 300 MG PO TABS: 300.0000 mg | ORAL_TABLET | Freq: Every day | ORAL | 5 refills | Status: DC

## 2016-10-31 ENCOUNTER — Ambulatory Visit (INDEPENDENT_AMBULATORY_CARE_PROVIDER_SITE_OTHER): Payer: Medicaid Other | Admitting: Family Medicine

## 2016-10-31 ENCOUNTER — Encounter: Payer: Self-pay | Admitting: Family Medicine

## 2016-10-31 VITALS — BP 152/102 | HR 89 | Temp 98.8°F | Resp 14 | Ht 70.0 in | Wt 214.0 lb

## 2016-10-31 DIAGNOSIS — R9431 Abnormal electrocardiogram [ECG] [EKG]: Secondary | ICD-10-CM

## 2016-10-31 DIAGNOSIS — Z6830 Body mass index (BMI) 30.0-30.9, adult: Secondary | ICD-10-CM | POA: Diagnosis not present

## 2016-10-31 DIAGNOSIS — E785 Hyperlipidemia, unspecified: Secondary | ICD-10-CM | POA: Diagnosis not present

## 2016-10-31 DIAGNOSIS — B20 Human immunodeficiency virus [HIV] disease: Secondary | ICD-10-CM

## 2016-10-31 DIAGNOSIS — I10 Essential (primary) hypertension: Secondary | ICD-10-CM | POA: Diagnosis not present

## 2016-10-31 DIAGNOSIS — I129 Hypertensive chronic kidney disease with stage 1 through stage 4 chronic kidney disease, or unspecified chronic kidney disease: Secondary | ICD-10-CM | POA: Diagnosis not present

## 2016-10-31 DIAGNOSIS — E66811 Obesity, class 1: Secondary | ICD-10-CM

## 2016-10-31 DIAGNOSIS — N184 Chronic kidney disease, stage 4 (severe): Secondary | ICD-10-CM

## 2016-10-31 DIAGNOSIS — E669 Obesity, unspecified: Secondary | ICD-10-CM | POA: Diagnosis not present

## 2016-10-31 DIAGNOSIS — I5032 Chronic diastolic (congestive) heart failure: Secondary | ICD-10-CM | POA: Diagnosis not present

## 2016-10-31 LAB — COMPLETE METABOLIC PANEL WITH GFR
ALT: 13 U/L (ref 9–46)
AST: 14 U/L (ref 10–35)
Albumin: 3.9 g/dL (ref 3.6–5.1)
Alkaline Phosphatase: 54 U/L (ref 40–115)
BUN: 20 mg/dL (ref 7–25)
CO2: 28 mmol/L (ref 20–31)
CREATININE: 2.29 mg/dL — AB (ref 0.70–1.33)
Calcium: 9.3 mg/dL (ref 8.6–10.3)
Chloride: 102 mmol/L (ref 98–110)
GFR, Est African American: 37 mL/min — ABNORMAL LOW (ref >=60)
GFR, Est Non African American: 32 mL/min — ABNORMAL LOW (ref >=60)
Glucose, Bld: 98 mg/dL (ref 65–99)
POTASSIUM: 3.1 mmol/L — AB (ref 3.5–5.3)
Sodium: 141 mmol/L (ref 135–146)
Total Bilirubin: 0.4 mg/dL (ref 0.2–1.2)
Total Protein: 6.8 g/dL (ref 6.1–8.1)

## 2016-10-31 LAB — POCT URINALYSIS DIP (DEVICE)
Bilirubin Urine: NEGATIVE
GLUCOSE, UA: 500 mg/dL — AB
KETONES UR: NEGATIVE mg/dL
Leukocytes, UA: NEGATIVE
NITRITE: NEGATIVE
PH: 6 (ref 5.0–8.0)
PROTEIN: 100 mg/dL — AB
Specific Gravity, Urine: 1.015 (ref 1.005–1.030)
UROBILINOGEN UA: 0.2 mg/dL (ref 0.0–1.0)

## 2016-10-31 LAB — HEMOGLOBIN A1C
Hgb A1c MFr Bld: 5.1 % (ref <5.7)
MEAN PLASMA GLUCOSE: 100 mg/dL

## 2016-10-31 LAB — GLUCOSE, CAPILLARY: Glucose-Capillary: 213 mg/dL — ABNORMAL HIGH (ref 65–99)

## 2016-10-31 MED ORDER — ISOSORBIDE MONONITRATE ER 30 MG PO TB24: 30.0000 mg | ORAL_TABLET | Freq: Every day | ORAL | 3 refills | Status: DC

## 2016-10-31 MED ORDER — CLONIDINE HCL 0.1 MG PO TABS
0.2000 mg | ORAL_TABLET | Freq: Once | ORAL | Status: AC
  Administered 2016-10-31: 0.2 mg via ORAL

## 2016-10-31 MED ORDER — CARVEDILOL 25 MG PO TABS: 25.0000 mg | ORAL_TABLET | Freq: Two times a day (BID) | ORAL | 1 refills | Status: DC

## 2016-10-31 MED ORDER — AMLODIPINE BESYLATE 10 MG PO TABS
10.0000 mg | ORAL_TABLET | Freq: Every day | ORAL | 1 refills | Status: DC
Start: 2016-10-31 — End: 2023-02-19

## 2016-10-31 MED ORDER — CLONIDINE HCL 0.1 MG PO TABS
0.1000 mg | ORAL_TABLET | Freq: Once | ORAL | Status: AC
  Administered 2016-10-31: 0.1 mg via ORAL

## 2016-10-31 MED ORDER — FUROSEMIDE 40 MG PO TABS: 40.0000 mg | ORAL_TABLET | Freq: Two times a day (BID) | ORAL | 1 refills | Status: DC

## 2016-10-31 MED ORDER — ATORVASTATIN CALCIUM 40 MG PO TABS: 40.0000 mg | ORAL_TABLET | Freq: Every day | ORAL | 1 refills | Status: DC

## 2016-10-31 MED ORDER — HYDRALAZINE HCL 100 MG PO TABS: 50.0000 mg | ORAL_TABLET | Freq: Three times a day (TID) | ORAL | 1 refills | Status: DC

## 2016-10-31 MED FILL — ?FUROSEMIDE 40 MG TABLET: 40 | 30 days supply | Qty: 60 | Fill #0

## 2016-10-31 MED FILL — hydrALAZINE HCL 100 MG TABS: 100 | 30 days supply | Qty: 45 | Fill #0

## 2016-10-31 MED FILL — ATORVASTATIN 40 MG TABLET: 40 | 30 days supply | Qty: 30 | Fill #0

## 2016-10-31 MED FILL — ?AMLODIPINE BESYLATE 10 MG: 10 | 30 days supply | Qty: 30 | Fill #0

## 2016-10-31 MED FILL — ISOSORBIDE MN ER 30 MG TAB: 30 | 90 days supply | Qty: 90 | Fill #0

## 2016-10-31 MED FILL — ?CARVEDILOL 25 MG TABLET: 25 | 30 days supply | Qty: 60 | Fill #0

## 2016-10-31 NOTE — Progress Notes (Signed)
Subjective:    Patient ID: Albert Eaton, male    DOB: 1966-06-06, 51 y.o.   MRN: [id number]  HPI Albert Eaton, a 51 year old male with a history of malignant hypertension, CHF and HIV present to establish care. [name] relocated to area from [place] and has been lost to follow up with primary care. He was recently admitted to inpatient services for chest pain and accelerated blood pressure. Cardiac symptoms have subsided. Patient continues to have elevated blood pressure. He has been unable to obtain medications due to financial constraints. He has been out of all medications for greater than 1 week including antiviral medications. HIV disease is currently stable. He does not have difficulty eating. He denies current substance abuse. He has been non adherent to HAART therapy for greater than 1 week.  Patient here for malignant hypertension. He is not exercising and is not adherent to low salt diet. He does not check blood pressures at home.Patient denies chest pain, dyspnea, fatigue, irregular heart beat, lower extremity edema, orthopnea, palpitations, syncope and tachypnea.  Cardiovascular risk factors: dyslipidemia, male gender, obesity (BMI >= 30 kg/m2) and sedentary lifestyle.  Past Medical History:  Diagnosis Date  . CHF (congestive heart failure) ([place])   . CKD (chronic kidney disease)   . Gout   . HIV (human immunodeficiency virus infection) ([place])   . HLD (hyperlipidemia)   . HTN (hypertension)   . OSA (obstructive sleep apnea)    Immunization History  Administered Date(s) Administered  . Influenza,inj,Quad PF,36+ Mos 10/10/2016  . Meningococcal Mcv4o 10/15/2016  . Pneumococcal Polysaccharide-23 10/15/2016   Social History   Social History Narrative  . No narrative on file    Review of Systems  Constitutional: Negative.   HENT: Negative.   Eyes: Negative.   Respiratory: Negative.  Negative for chest tightness and shortness of breath.   Cardiovascular: Negative.   Negative for chest pain, palpitations and leg swelling.  Gastrointestinal: Negative.   Endocrine: Negative.  Negative for polydipsia, polyphagia and polyuria.  Genitourinary: Negative.   Musculoskeletal: Negative.   Skin: Negative.   Allergic/Immunologic: Positive for immunocompromised state (HIV +).  Neurological: Negative.   Hematological: Negative.   Psychiatric/Behavioral: Negative.         Objective:   Physical Exam  HENT:  Head: Normocephalic and atraumatic.  Right Ear: External ear normal.  Left Ear: External ear normal.  Nose: Nose normal.  Mouth/Throat: Oropharynx is clear and moist.  Eyes: Conjunctivae and EOM are normal. Pupils are equal, round, and reactive to light.  Neck: Normal range of motion. Neck supple.  Cardiovascular: An irregular rhythm present.  Murmur heard.  Diastolic murmur is present  Pulses:      Carotid pulses are 2+ on the right side, and 2+ on the left side.      Radial pulses are 2+ on the right side, and 2+ on the left side.       Femoral pulses are 2+ on the right side, and 2+ on the left side.      Popliteal pulses are 2+ on the right side, and 2+ on the left side.       Dorsalis pedis pulses are 2+ on the right side, and 2+ on the left side.       Posterior tibial pulses are 2+ on the right side, and 2+ on the left side.  Pulmonary/Chest: Effort normal and breath sounds normal.  Abdominal: Soft. Bowel sounds are normal.  BP (!) 152/102 (BP Location: Right Arm, Patient Position: Sitting, Cuff Size: Normal)   Pulse 89   Temp 98.8 F (37.1 C) (Oral)   Resp 14   Ht 5' 10"  (1.778 m)   Wt 214 lb (97.1 kg)   SpO2 98%   BMI 30.71 kg/m  Assessment & Plan:   1. Accelerated hypertension Blood pressure is markedly elevated. Recommend that patient follow up at the emergency department for further evaluation if blood pressure continues to increase. Patient is currently assymptomatic. Blood pressure improved to 152/102 following Clonidine  0.3 mg in office.  - cloNIDine (CATAPRES) tablet 0.2 mg; Take 2 tablets (0.2 mg total) by mouth once. - EKG 12-Lead - Ambulatory referral to Cardiology - cloNIDine (CATAPRES) tablet 0.1 mg; Take 1 tablet (0.1 mg total) by mouth once.  2. Chronic diastolic heart failure (HCC) - carvedilol (COREG) 25 MG tablet; Take 1 tablet (25 mg total) by mouth 2 (two) times daily with a meal.  Dispense: 60 tablet; Refill: 1 - furosemide (LASIX) 40 MG tablet; Take 1 tablet (40 mg total) by mouth 2 (two) times daily.  Dispense: 60 tablet; Refill: 1 - isosorbide mononitrate (IMDUR) 30 MG 24 hr tablet; Take 1 tablet (30 mg total) by mouth daily.  Dispense: 90 tablet; Refill: 3 - EKG 12-Lead - Ambulatory referral to Cardiology  3. Hyperlipidemia, unspecified hyperlipidemia type - atorvastatin (LIPITOR) 40 MG tablet; Take 1 tablet (40 mg total) by mouth daily.  Dispense: 30 tablet; Refill: 1  4. Malignant hypertension - amLODipine (NORVASC) 10 MG tablet; Take 1 tablet (10 mg total) by mouth daily.  Dispense: 30 tablet; Refill: 1 - hydrALAZINE (APRESOLINE) 100 MG tablet; Take 0.5 tablets (50 mg total) by mouth 3 (three) times daily.  Dispense: 90 tablet; Refill: 1 - COMPLETE METABOLIC PANEL WITH GFR  5. CKD stage 4 secondary to hypertension [facility]) Will send a referral to nephrology  6. HIV disease [facility]) Recommend that Albert Eaton follow up with Dr. Johnnye Sima concerning medications.   7. Class 1 obesity with serious comorbidity and body mass index (BMI) of 30.0 to 30.9 in adult, unspecified obesity type Recommend a lowfat, low carbohydrate diet divided over 5-6 small meals, increase water intake to 6-8 glasses, and 150 minutes per week of cardiovascular exercise.   - Hemoglobin A1c  8. Abnormal electrocardiogram (ECG) (EKG) Referral sent to cardiology  RTC: Patient will follow up in office in 1 week for blood pressure check     The patient was given clear instructions to go to ER or return to medical  center if symptoms do not improve, worsen or new problems develop. The patient verbalized understanding. Will notify patient with laboratory results.  [name], FNP    Preventative maintenance:  Patient has not had a colonoscopy  Greater than 1 year since last prostate exam Last eye exam was December 2014

## 2016-10-31 NOTE — Patient Instructions (Addendum)
Heart Failure Heart failure means your heart has trouble pumping blood. This makes it hard for your body to work well. Heart failure is usually a long-term (chronic) condition. You must take good care of yourself and follow your doctor's treatment plan. HOME CARE  Take your heart medicine as told by your doctor.  Do not stop taking medicine unless your doctor tells you to.  Do not skip any dose of medicine.  Refill your medicines before they run out.  Take other medicines only as told by your doctor or pharmacist.  Stay active if told by your doctor. The elderly and people with severe heart failure should talk with a doctor about physical activity.  Eat heart-healthy foods. Choose foods that are without trans fat and are low in saturated fat, cholesterol, and salt (sodium). This includes fresh or frozen fruits and vegetables, fish, lean meats, fat-free or low-fat dairy foods, whole grains, and high-fiber foods. Lentils and dried peas and beans (legumes) are also good choices.  Limit salt if told by your doctor.  Cook in a healthy way. Roast, grill, broil, bake, poach, steam, or stir-fry foods.  Limit fluids as told by your doctor.  Weigh yourself every morning. Do this after you pee (urinate) and before you eat breakfast. Write down your weight to give to your doctor.  Take your blood pressure and write it down if your doctor tells you to.  Ask your doctor how to check your pulse. Check your pulse as told.  Lose weight if told by your doctor.  Stop smoking or chewing tobacco. Do not use gum or patches that help you quit without your doctor's approval.  Schedule and go to doctor visits as told.  Nonpregnant women should have no more than 1 drink a day. Men should have no more than 2 drinks a day. Talk to your doctor about drinking alcohol.  Stop illegal drug use.  Stay current with shots (immunizations).  Manage your health conditions as told by your doctor.  Learn to  manage your stress.  Rest when you are tired.  If it is really hot outside:  Avoid intense activities.  Use air conditioning or fans, or get in a cooler place.  Avoid caffeine and alcohol.  Wear loose-fitting, lightweight, and light-colored clothing.  If it is really cold outside:  Avoid intense activities.  Layer your clothing.  Wear mittens or gloves, a hat, and a scarf when going outside.  Avoid alcohol.  Learn about heart failure and get support as needed.  Get help to maintain or improve your quality of life and your ability to care for yourself as needed. GET HELP IF:   You gain weight quickly.  You are more short of breath than usual.  You cannot do your normal activities.  You tire easily.  You cough more than normal, especially with activity.  You have any or more puffiness (swelling) in areas such as your hands, feet, ankles, or belly (abdomen).  You cannot sleep because it is hard to breathe.  You feel like your heart is beating fast (palpitations).  You get dizzy or light-headed when you stand up. GET HELP RIGHT AWAY IF:   You have trouble breathing.  There is a change in mental status, such as becoming less alert or not being able to focus.  You have chest pain or discomfort.  You faint. MAKE SURE YOU:   Understand these instructions.  Will watch your condition.  Will get help right away if you  are not doing well or get worse. This information is not intended to replace advice given to you by your health care provider. Make sure you discuss any questions you have with your health care provider. Document Released: 07/22/2008 Document Revised: 11/03/2014 Document Reviewed: 11/29/2012 Elsevier Interactive Patient Education  2017 [place].  Hypertension Hypertension is another name for high blood pressure. High blood pressure forces your heart to work harder to pump blood. A blood pressure reading has two numbers, which includes a  higher number over a lower number (example: 110/72). Follow these instructions at home:  Have your blood pressure rechecked by your doctor.  Only take medicine as told by your doctor. Follow the directions carefully. The medicine does not work as well if you skip doses. Skipping doses also puts you at risk for problems.  Do not smoke.  Monitor your blood pressure at home as told by your doctor. Contact a doctor if:  You think you are having a reaction to the medicine you are taking.  You have repeat headaches or feel dizzy.  You have puffiness (swelling) in your ankles.  You have trouble with your vision. Get help right away if:  You get a very bad headache and are confused.  You feel weak, numb, or faint.  You get chest or belly (abdominal) pain.  You throw up (vomit).  You cannot breathe very well. This information is not intended to replace advice given to you by your health care provider. Make sure you discuss any questions you have with your health care provider. Document Released: 03/31/2008 Document Revised: 03/20/2016 Document Reviewed: 08/05/2013 Elsevier Interactive Patient Education  2017 [place].  Managing Your Hypertension Hypertension is commonly called high blood pressure. Blood pressure is a measurement of how strongly your blood is pressing against the walls of your arteries. Arteries are blood vessels that carry blood from your heart throughout your body. Blood pressure does not stay the same. It rises when you are active, excited, or nervous. It lowers when you are sleeping or relaxed. If the numbers that measure your blood pressure stay above normal most of the time, you are at risk for health problems. Hypertension is a long-term (chronic) condition in which blood pressure is elevated. This condition often has no signs or symptoms. The cause of the condition is usually not known. What are blood pressure readings? A blood pressure reading is recorded as  two numbers, such as "120 over 80" (or 120/80). The first ("top") number is called the systolic pressure. It is a measure of the pressure in your arteries as the heart beats. The second ("bottom") number is called the diastolic pressure. It is a measure of the pressure in your arteries as the heart relaxes between beats. What does my blood pressure reading mean? Blood pressure is classified into four stages. Based on your blood pressure reading, your health care provider may use the following stages to determine what type of treatment, if any, is needed. Systolic pressure and diastolic pressure are measured in a unit called mm Hg. Normal  Systolic pressure: below 825.  Diastolic pressure: below 80. Prehypertension  Systolic pressure: 053-976.  Diastolic pressure: 73-41. Hypertension stage 1  Systolic pressure: 937-902.  Diastolic pressure: 40-97. Hypertension stage 2  Systolic pressure: 353 or above.  Diastolic pressure: 299 or above. What health risks are associated with hypertension? Managing your hypertension is an important responsibility. Uncontrolled hypertension can lead to:  A heart attack.  A stroke.  A weakened blood  vessel (aneurysm).  Heart failure.  Kidney damage.  Eye damage.  Metabolic syndrome.  Memory and concentration problems. What changes can I make to manage my hypertension? Hypertension can be managed effectively by making lifestyle changes and possibly by taking medicines. Your health care provider will help you come up with a plan to bring your blood pressure within a normal range. Your plan should include the following: Monitoring  Monitor your blood pressure at home as told by your health care provider. Your personal target blood pressure may vary depending on your medical conditions, your age, and other factors.  Have your blood pressure rechecked as told by your health care provider. Lifestyle  Lose weight if necessary.  Get at least  30-45 minutes of aerobic exercise at least 4 times a week.  Do not use any products that contain nicotine or tobacco, such as cigarettes and e-cigarettes. If you need help quitting, ask your health care provider.  Learn ways to reduce stress.  Control any chronic conditions, such as high cholesterol or diabetes. Eating and drinking  Follow the DASH diet. This diet is high in fruits, vegetables, and whole grains. It is low in salt, red meat, and added sugars.  Keep your sodium intake below 2,300 mg per day.  Limit alcoholic beverages. Communication  Review all the medicines you take with your health care provider because there may be side effects or interactions.  Talk with your health care provider about your diet, exercise habits, and other lifestyle factors that may be contributing to hypertension.  See your health care provider regularly. Your health care provider can help you create and adjust your plan for managing hypertension. Will I need medicine to control my blood pressure? Your health care provider may prescribe medicine if lifestyle changes are not enough to get your blood pressure under control, and if one of the following is true:  You are 19-15 years of age, and your systolic blood pressure is 140 or higher.  You are 31 years of age or older, and your systolic blood pressure is 150 or higher.  Your diastolic blood pressure is 90 or higher.  You have diabetes, and your systolic blood pressure is over 315 or your diastolic blood pressure is over 90.  You have kidney disease, and your blood pressure is above 140/90.  You have heart disease or a history of stroke, and your blood pressure is 140/90 or higher. Take medicines only as told by your health care provider. Follow the directions carefully. Blood pressure medicines must be taken as prescribed. The medicine does not work as well when you skip doses. Skipping doses also puts you at risk for problems. Contact a  health care provider if:  You think you are having a reaction to medicines you have taken.  You have repeated (recurrent) headaches.  You feel dizzy.  You have swelling in your ankles.  You have trouble with your vision. Get help right away if:  You develop a severe headache or confusion.  You have unusual weakness or numbness, or you feel faint.  You have severe pain in your chest or abdomen.  You vomit repeatedly.  You have trouble breathing. This information is not intended to replace advice given to you by your health care provider. Make sure you discuss any questions you have with your health care provider. Document Released: 07/07/2012 Document Revised: 06/17/2016 Document Reviewed: 01/11/2016 Elsevier Interactive Patient Education  2017 [employer].

## 2016-11-03 ENCOUNTER — Other Ambulatory Visit: Payer: Self-pay | Admitting: Family Medicine

## 2016-11-03 ENCOUNTER — Other Ambulatory Visit: Payer: Self-pay | Admitting: Hematology

## 2016-11-03 ENCOUNTER — Telehealth: Payer: Self-pay | Admitting: Hematology

## 2016-11-03 ENCOUNTER — Other Ambulatory Visit (INDEPENDENT_AMBULATORY_CARE_PROVIDER_SITE_OTHER): Payer: Medicaid Other

## 2016-11-03 DIAGNOSIS — M109 Gout, unspecified: Secondary | ICD-10-CM

## 2016-11-03 DIAGNOSIS — E876 Hypokalemia: Secondary | ICD-10-CM

## 2016-11-03 LAB — URIC ACID: Uric Acid, Serum: 10.6 mg/dL — ABNORMAL HIGH (ref 4.0–8.0)

## 2016-11-03 MED ORDER — ALLOPURINOL 100 MG PO TABS: 100.0000 mg | ORAL_TABLET | Freq: Every day | ORAL | 1 refills | Status: DC

## 2016-11-03 MED ORDER — POTASSIUM CHLORIDE ER 10 MEQ PO TBCR: 10.0000 meq | EXTENDED_RELEASE_TABLET | Freq: Every day | ORAL | 5 refills | Status: DC

## 2016-11-03 MED ORDER — COLCHICINE 0.6 MG PO TABS: 0.6000 mg | ORAL_TABLET | Freq: Every day | ORAL | 0 refills | Status: DC

## 2016-11-03 MED FILL — COLCHICINE 0.6 MG TABLET: 0.6 | 15 days supply | Qty: 10 | Fill #0

## 2016-11-03 MED FILL — ?ALLOPURINOL 100MG TABLET: 100 | 30 days supply | Qty: 30 | Fill #0

## 2016-11-03 MED FILL — POTASSIUM CL 10 MEQ TAB SA: 10 | 30 days supply | Qty: 30 | Fill #0

## 2016-11-03 NOTE — Progress Notes (Signed)
Meds ordered this encounter  Medications  . potassium chloride (K-DUR) 10 MEQ tablet    Sig: Take 1 tablet (10 mEq total) by mouth daily.    Dispense:  30 tablet    Refill:  5

## 2016-11-03 NOTE — Telephone Encounter (Signed)
Received stat lab from [place] with [facility].  Uric Acid 10.6, read back and confirmed.  Provider notified.

## 2016-11-06 ENCOUNTER — Ambulatory Visit: Payer: Self-pay

## 2016-11-06 VITALS — BP 144/72

## 2016-11-06 DIAGNOSIS — I10 Essential (primary) hypertension: Secondary | ICD-10-CM

## 2016-11-11 ENCOUNTER — Encounter: Payer: Self-pay | Admitting: Infectious Diseases

## 2016-11-12 ENCOUNTER — Ambulatory Visit: Payer: Self-pay | Admitting: Infectious Diseases

## 2016-11-20 ENCOUNTER — Encounter: Payer: Self-pay | Admitting: Infectious Diseases

## 2016-11-25 LAB — HIV-1 INTEGRASE GENOTYPE

## 2016-11-27 NOTE — Progress Notes (Signed)
Cardiology Office Note    Date:  12/02/2016   ID:  Albert Eaton, DOB [birth year], MRN [id number]  PCP:  [name], FNP  Cardiologist:  Dr. [name]  Chief Complaint: HTN and chronic diastolic CHF  History of Present Illness:   Albert Eaton is a 51 y.o. male with a history of nonischemic non-dilated cardiomyopathy, chronic diastolic CHF, CKD stage III-IV, HTN, HLD, OSA and HIV  presents for high blood pressure.   Recently [facility] from [place]. He has been followed at [facility], [place]. Exercise nuclear stress test 04/2015 @ [facility] showed no evidence of ischemia or scar. Abnormal gated wall motion study revealing moderate global Hypokinesis. Abnormal left ventricular ejection fraction of 42%. However Ef was 55% on echo without WM abnormality.    Admitted 12/17 for chest pain in setting of uncontrolled HTN and elevated Scr to 2.45. Constant pain for >24 hours which exacerbated by moving head. EKG with non specific TWI insetting of LVH. Troponin flat trend. Chest pain resolved with improved blood pressure. Echo showed grade 2 DD with mild MR and and moderately dialated LA. No comment on LV function, suspect normal. Stress test is abnormal but low risk with no major areas of ischemia. Recommended medical therapy. Avoid cath due to CKD. BNP was minimally elevated to 454. Diuretics held due to worsening renal function that resume at discharge. Avoid nephrotoxic agent.   Seen by me in clinic 10/24/16 for follow up. Added imdur to regimen due to elevated blood pressure. Referral made to [facility]. Plan to wait for sleep study until he gets insurance.   Nephrologist could not see him until he gets his insurance. He has applied for Medicaid. Intermittent orthopnea when weights up. No PND, syncope or Le Edema. No chest pain or shortness of breath.   He also wants to discussed about his medication.  "He does have ADAP (aids drug  assistance program) for his "insurance". He was asking me about some of his cardiology medications - Coreg, Lasix, and Hydralazine - and they are not covered on ADAP. I gave him a list of the medications on the [place]. I instructed him to take the list to you tomorrow and discuss any medication changes. "  Magda Kiel, PharmD, CPP  Infectious Diseases Clinical Pharmacist    Past Medical History:  Diagnosis Date  . CHF (congestive heart failure) ([place])   . CKD (chronic kidney disease)   . Gout   . HIV (human immunodeficiency virus infection) ([place])   . HLD (hyperlipidemia)   . HTN (hypertension)   . OSA (obstructive sleep apnea)     Past Surgical History:  Procedure Laterality Date  . CHOLECYSTECTOMY    . HERNIA REPAIR     As baby    Current Medications: Prior to Admission medications   Medication Sig Start Date End Date Taking? Authorizing Provider  abacavir (ZIAGEN) 300 MG tablet Take 1 tablet (300 mg total) by mouth daily. Patient not taking: Reported on 10/31/2016 10/30/16   Campbell Riches, MD  allopurinol (ZYLOPRIM) 100 MG tablet Take 1 tablet (100 mg total) by mouth daily. 11/03/16   [name], FNP  amLODipine (NORVASC) 10 MG tablet Take 1 tablet (10 mg total) by mouth daily. 10/31/16   [name], FNP  atorvastatin (LIPITOR) 40 MG tablet Take 1 tablet (40 mg total) by mouth daily. 10/31/16   [name], FNP  carvedilol (COREG) 25 MG tablet Take 1 tablet (  25 mg total) by mouth 2 (two) times daily with a meal. 10/31/16   [name], FNP  colchicine 0.6 MG tablet Take 1 tablet (0.6 mg total) by mouth daily. 11/03/16   [name], FNP  furosemide (LASIX) 40 MG tablet Take 1 tablet (40 mg total) by mouth 2 (two) times daily. 10/31/16   [name], FNP  hydrALAZINE (APRESOLINE) 100 MG tablet Take 0.5 tablets (50 mg total) by mouth 3 (three) times daily. 10/31/16   [name], FNP  isosorbide mononitrate (IMDUR) 30 MG 24 hr tablet Take  1 tablet (30 mg total) by mouth daily. 10/31/16   [name], FNP  lamiVUDine (EPIVIR) 150 MG tablet Take 1 tablet (150 mg total) by mouth daily. Patient not taking: Reported on 10/31/2016 10/30/16   Campbell Riches, MD  Potassium 99 MG TABS Take 198 mg by mouth 2 (two) times daily.    Historical Provider, MD  potassium chloride (K-DUR) 10 MEQ tablet Take 1 tablet (10 mEq total) by mouth daily. 11/03/16   [name], FNP  raltegravir (ISENTRESS) 400 MG tablet Take 1 tablet (400 mg total) by mouth 2 (two) times daily. Patient not taking: Reported on 10/31/2016 10/30/16   Campbell Riches, MD  zinc gluconate 50 MG tablet Take 100 mg by mouth daily.    Historical Provider, MD    Allergies:   Patient has no known allergies.   Social History   Social History  . Marital status: Unknown    Spouse name: N/A  . Number of children: N/A  . Years of education: N/A   Social History Main Topics  . Smoking status: Former Smoker    Quit date: 2000  . Smokeless tobacco: Former Systems developer    Quit date: 09/10/1999  . Alcohol use 8.4 oz/week    14 Shots of liquor per week     Comment: Light use  . Drug use: No  . Sexual activity: No     Comment: declined condoms   Other Topics Concern  . None   Social History Narrative  . None     Family History:  The patient's family history includes Heart attack in his maternal grandmother; Heart failure in his mother; Hypertension in his brother and sister.   ROS:   Please see the history of present illness.    ROS All other systems reviewed and are negative.   PHYSICAL EXAM:   VS:  BP (!) 150/100   Pulse 78   Ht 5' 10"  (1.778 m)   Wt 211 lb (95.7 kg)   SpO2 97%   BMI 30.28 kg/m    GEN: Well nourished, well developed, in no acute distress  HEENT: normal  Neck: no JVD, carotid bruits, or masses Cardiac: RRR; no murmurs, rubs, or gallops,no edema  Respiratory:  clear to auscultation bilaterally, normal work of breathing GI: soft, nontender,  nondistended, + BS MS: no deformity or atrophy  Skin: warm and dry, no rash Neuro:  Alert and Oriented x 3, Strength and sensation are intact Psych: euthymic mood, full affect  Wt Readings from Last 3 Encounters:  12/02/16 211 lb (95.7 kg)  12/02/16 210 lb (95.3 kg)  12/01/16 210 lb (95.3 kg)      Studies/Labs Reviewed:   EKG:  EKG is not ordered today.    Recent Labs: 10/09/2016: B Natriuretic Peptide 454.6 10/15/2016: Hemoglobin 13.4; Platelets 257 10/31/2016: ALT 13; BUN 20; Creat 2.29; Potassium 3.1; Sodium 141  Lipid Panel    Component Value Date/Time   CHOL 136 10/15/2016 1011   TRIG 94 10/15/2016 1011   HDL 49 10/15/2016 1011   CHOLHDL 2.8 10/15/2016 1011   VLDL 19 10/15/2016 1011   LDLCALC 68 10/15/2016 1011    Additional studies/ records that were reviewed today include:   [place] exercise stress test: 05/07/2015 @ [facility] exercise stress test due to failure to achieve 85% of his maximal predicted heart rate because of his beta blocker therapy. However there was no evidence of ischemia at maximal exercise tolerance.Nuclear images are reported separately.  Exercise Nuclear Stress Test:05/07/2015 @ [facility] Impression: 1.Normal exercise nuclear stress test revealing no evidence of ischemia or scar. 2.Abnormal gated wall motion study revealing moderate global hypokinesis. 3.Abnormal left ventricular ejection fraction of 42%.   TTE: 05/05/2015 @ [facility] Summary  There is no significant aortic stenosis or regurgitation. There is mild aortic regurgitation. No vegetations noted. Mildly dilated left atrium. Thrombus was not visualized within left atrium. There is mild concentric left ventricular hypertrophy. Left ventricular ejection fraction is estimated to be 55 %. Normal regional wall motion noted. The left ventricular function appears grossly normal. E to A reversal suggesting  decreased left ventricular compliance. There is mild to moderate mitral regurgitation. The mitral leaflet mobility is normal . No evidence of pericardial effusion. No evidence of pleural effusion. Normal pulmonic valve structure and function. Mild pulmonic regurgitation. Normal right atrium. Normal right ventricle structure and function. Mild tricuspid regurgitation. Estimate of right ventricular systolic pressure is 55 mmHg. The aortic root is normal in size.  US RENAL ARTERY STENOSIS HYPERTENSION COMPLETE: 05/07/2015 @ [facility]  IMPRESSION: No elevated velocities. However velocities of each renal artery are somewhat diminutive. The significance of this uncertain. Further evaluation with CTA or MRA of the renal vessels could be obtained for complete evaluation  Medical renal disease with echogenic kidneys  Hypoechoic region within the left upper quadrant measuring 4.5 x 2.9 x 3.7 cm may represent a splenule, however correlation with CT abdomen and pelvis with contrast is recommended.  The attending radiologist has reviewed all images, agrees with the interpretation, and provided appropriate supervision for this Exam.  Myoview 10/10/16  Slight accentuation of the ST/T wave abnormality in the inferolateral leads during infusion  There is a subtle small defect of mild severity present in the apex location. The defect is reversible and could represent a very small area of ischemia.  This is a low risk study.  The left ventricular ejection fraction is mildly decreased (45-54%).  Nuclear stress EF: 48%.  Echo 10/09/16 Study Conclusions  - Left ventricle: The cavity size was mildly dilated. Wall thickness was increased in a pattern of moderate LVH. Features are consistent with a pseudonormal left ventricular filling pattern, with concomitant abnormal relaxation and increased filling pressure (grade 2 diastolic dysfunction). - Mitral valve:  There was mild regurgitation. - Left atrium: The atrium was moderately dilated.    ASSESSMENT & PLAN:    1. HTN -  Elevated in clinic today. Unable to add ACE/ARB due to CKD. Continue Norvasc10 mg once a day, Coreg 25 mg twice a day, Imdur 55m qd. Will increase hydralazine to 741mTID (however per my note he was on 100 mg 3 times a day. States that currently he is taking 1/2 pill). Also per note 10/31/16, he was started on clonidine. But not on list. He will bring all of his medication during next visit in hypertension clinic.  2. Chronic diastolic CHF - EF of 57% on stress test. Echo showed grade 2 DD, moderate LVH. No comment on LVEF. Euvolemic on exam today. Admits to having intermittent dyspnea and weight gain. Advised to take extra 32m of lasix for this. BMET today.   3. CKD stage IV - [place]   4. OSA - Last sleep study was done in 2011/2012. No interested in repeat study currently to cost. He has applied for Medicaid/assistant program. He is willing to consider in future.  Will continue his current medication due to risk for recurrent CHF exacerbation. Coreg, Lasix and Hydralazine on $4 list. He is aggress to continue current medications.    Medication Adjustments/Labs and Tests Ordered: Current medicines are reviewed at length with the patient today.  Concerns regarding medicines are outlined above.  Medication changes, Labs and Tests ordered today are listed in the Patient Instructions below. Patient Instructions  Medication Instructions:  INCREASE hydralazine (apresoline) to 75 mg three times a day.  You may take an additional furosemide (lasix) 20 mg (1/2 tablet) for shortness of breath or weight gain.  Labwork: TODAY: BMET  Testing/Procedures: NONE  Follow-Up: Your physician recommends that you schedule a follow-up appointment in: 1 week with the pharmacist in the Hypertension Clinic.   Any  Other Special Instructions Will Be Listed Below (If Applicable).     If you need a refill on your cardiac medications before your next appointment, please call your pharmacy.     SJarrett Soho PUtah 12/02/2016 12:07 PM    [facility] [address] [place]  [address]Phone: [phone] Fax: [phone]

## 2016-12-01 ENCOUNTER — Ambulatory Visit (INDEPENDENT_AMBULATORY_CARE_PROVIDER_SITE_OTHER): Payer: Self-pay | Admitting: Infectious Diseases

## 2016-12-01 ENCOUNTER — Encounter: Payer: Self-pay | Admitting: Infectious Diseases

## 2016-12-01 VITALS — BP 145/93 | HR 66 | Temp 98.3°F | Ht 70.0 in | Wt 210.0 lb

## 2016-12-01 DIAGNOSIS — I10 Essential (primary) hypertension: Secondary | ICD-10-CM

## 2016-12-01 DIAGNOSIS — Z79899 Other long term (current) drug therapy: Secondary | ICD-10-CM

## 2016-12-01 DIAGNOSIS — Z113 Encounter for screening for infections with a predominantly sexual mode of transmission: Secondary | ICD-10-CM

## 2016-12-01 DIAGNOSIS — B20 Human immunodeficiency virus [HIV] disease: Secondary | ICD-10-CM

## 2016-12-01 DIAGNOSIS — I129 Hypertensive chronic kidney disease with stage 1 through stage 4 chronic kidney disease, or unspecified chronic kidney disease: Secondary | ICD-10-CM

## 2016-12-01 DIAGNOSIS — N184 Chronic kidney disease, stage 4 (severe): Secondary | ICD-10-CM

## 2016-12-01 NOTE — Progress Notes (Signed)
   Subjective:    Patient ID: Albert Eaton, male    DOB: 1966-10-01, 51 y.o.   MRN: [id number]  HPI  51 y.o.Mwith a history of CHF (nonischemic non-dilated CM with NYHA 2/C HF preserved EF 55% with moderate concentric hypertrophy and a grade 2 diastolic dysfunction. Moderate mitral valve insufficiency), CKD stage III-IV, HTN, HLD, OSA and HIV adm 12-14 with chest pain and hypertension.  He had stress test that was felt to be low risk and was managed medically. He was d/c home on 10-11-16.  He has HIV+ partner, on meds.   Has been feeling well til this AM- had intense pain in his L hip. Feels like it is going to pop out of place. Has had for 3-4 years. Intermittent. No leg length discrepancy. No hx of injury.  Has had some swelling after gout flare (11-01-16). Taking Isentress, Epivir, abacvir. No missed.  Bp is improved, still erratic.    HIV 1 RNA Quant (copies/mL)  Date Value  10/15/2016 <20   CD4 T Cell Abs (/uL)  Date Value  10/15/2016 600    Review of Systems  Constitutional: Negative for appetite change and unexpected weight change.  Respiratory: Positive for shortness of breath.   Cardiovascular: Positive for chest pain and leg swelling.  Gastrointestinal: Negative for constipation and diarrhea.  Genitourinary: Negative for difficulty urinating.  CP is dull ache, not pain. Has SOB occas, sometimes awakens from sleep. Does not have in afternoon. Can walk 3-4 miles without problems otherwise.      Objective:   Physical Exam  Constitutional: He appears well-developed and well-nourished.  HENT:  Mouth/Throat: No oropharyngeal exudate.  Eyes: EOM are normal. Pupils are equal, round, and reactive to light.  Neck: Neck supple.  Cardiovascular: Normal rate, regular rhythm and normal heart sounds.   Pulmonary/Chest: Effort normal and breath sounds normal.  Abdominal: Soft. Bowel sounds are normal. There is no tenderness. There is no rebound.  Musculoskeletal: He  exhibits no edema.  Lymphadenopathy:    He has no cervical adenopathy.          Assessment & Plan:

## 2016-12-01 NOTE — Assessment & Plan Note (Signed)
Well controlled.  Appreciate CV f/u (tomorrow)

## 2016-12-01 NOTE — Assessment & Plan Note (Signed)
He is doing well Will defer his repeat labs til his next f/u visit.  Has gotten flu shot.  Will see him back in 6 months.

## 2016-12-01 NOTE — Assessment & Plan Note (Signed)
Appears to be doing well.  His cr has been stable.  Has not been able to seen at [facility] due to co-pay.

## 2016-12-02 ENCOUNTER — Encounter: Payer: Self-pay | Admitting: Family Medicine

## 2016-12-02 ENCOUNTER — Ambulatory Visit (INDEPENDENT_AMBULATORY_CARE_PROVIDER_SITE_OTHER): Payer: Medicaid Other | Admitting: Physician Assistant

## 2016-12-02 ENCOUNTER — Encounter: Payer: Self-pay | Admitting: Physician Assistant

## 2016-12-02 VITALS — BP 150/100 | HR 78 | Ht 70.0 in | Wt 211.0 lb

## 2016-12-02 VITALS — BP 160/102 | HR 71 | Temp 98.7°F | Resp 16 | Ht 70.0 in | Wt 210.0 lb

## 2016-12-02 DIAGNOSIS — G4733 Obstructive sleep apnea (adult) (pediatric): Secondary | ICD-10-CM

## 2016-12-02 DIAGNOSIS — N184 Chronic kidney disease, stage 4 (severe): Secondary | ICD-10-CM | POA: Diagnosis not present

## 2016-12-02 DIAGNOSIS — I129 Hypertensive chronic kidney disease with stage 1 through stage 4 chronic kidney disease, or unspecified chronic kidney disease: Secondary | ICD-10-CM | POA: Diagnosis not present

## 2016-12-02 DIAGNOSIS — I10 Essential (primary) hypertension: Secondary | ICD-10-CM

## 2016-12-02 DIAGNOSIS — I5032 Chronic diastolic (congestive) heart failure: Secondary | ICD-10-CM | POA: Diagnosis not present

## 2016-12-02 LAB — BASIC METABOLIC PANEL
BUN/Creatinine Ratio: 11 (ref 9–20)
BUN: 31 mg/dL — AB (ref 6–24)
CALCIUM: 9.5 mg/dL (ref 8.7–10.2)
CO2: 25 mmol/L (ref 18–29)
Chloride: 96 mmol/L (ref 96–106)
Creatinine, Ser: 2.82 mg/dL — ABNORMAL HIGH (ref 0.76–1.27)
GFR calc Af Amer: 29 mL/min/{1.73_m2} — ABNORMAL LOW (ref >59)
GFR calc non Af Amer: 25 mL/min/{1.73_m2} — ABNORMAL LOW (ref >59)
GLUCOSE: 75 mg/dL (ref 65–99)
POTASSIUM: 3.4 mmol/L — AB (ref 3.5–5.2)
Sodium: 137 mmol/L (ref 134–144)

## 2016-12-02 MED ORDER — HYDRALAZINE HCL 50 MG PO TABS: 75.0000 mg | ORAL_TABLET | Freq: Three times a day (TID) | ORAL | 3 refills | Status: DC

## 2016-12-02 MED ORDER — FUROSEMIDE 40 MG PO TABS: ORAL_TABLET | ORAL | 30 refills | Status: DC

## 2016-12-02 MED FILL — FUROSEMIDE 40 MG TABLET: 40 | 30 days supply | Qty: 60 | Fill #0

## 2016-12-02 MED FILL — hydrALAZINE HCL 50 MG TABS: 50 | 30 days supply | Qty: 135 | Fill #0

## 2016-12-02 NOTE — Patient Instructions (Signed)
Medication Instructions:  INCREASE hydralazine (apresoline) to 75 mg three times a day.  You may take an additional furosemide (lasix) 20 mg (1/2 tablet) for shortness of breath or weight gain.  Labwork: TODAY: BMET  Testing/Procedures: NONE  Follow-Up: Your physician recommends that you schedule a follow-up appointment in: 1 week with the pharmacist in the Hypertension Clinic.   Any Other Special Instructions Will Be Listed Below (If Applicable).     If you need a refill on your cardiac medications before your next appointment, please call your pharmacy.

## 2016-12-02 NOTE — Progress Notes (Signed)
   Subjective:    Patient ID: Albert Eaton, male    DOB: October 29, 1965, 51 y.o.   MRN: [id number]  HPI  Past Medical History:  Diagnosis Date  . CHF (congestive heart failure) ([place])   . CKD (chronic kidney disease)   . Gout   . HIV (human immunodeficiency virus infection) ([place])   . HLD (hyperlipidemia)   . HTN (hypertension)   . OSA (obstructive sleep apnea)    Immunization History  Administered Date(s) Administered  . Influenza,inj,Quad PF,36+ Mos 10/10/2016  . Meningococcal Mcv4o 10/15/2016  . Pneumococcal Polysaccharide-23 10/15/2016   Social History   Social History Narrative  . No narrative on file    Review of Systems  Constitutional: Negative.   HENT: Negative.   Eyes: Negative.   Respiratory: Negative.  Negative for chest tightness and shortness of breath.   Cardiovascular: Negative.  Negative for chest pain, palpitations and leg swelling.  Gastrointestinal: Negative.   Endocrine: Negative.  Negative for polydipsia, polyphagia and polyuria.  Genitourinary: Negative.   Musculoskeletal: Negative.   Skin: Negative.   Allergic/Immunologic: Positive for immunocompromised state (HIV +).  Neurological: Negative.   Hematological: Negative.   Psychiatric/Behavioral: Negative.         Objective:   Physical Exam  HENT:  Head: Normocephalic and atraumatic.  Right Ear: External ear normal.  Left Ear: External ear normal.  Nose: Nose normal.  Mouth/Throat: Oropharynx is clear and moist.  Eyes: Conjunctivae and EOM are normal. Pupils are equal, round, and reactive to light.  Neck: Normal range of motion. Neck supple.  Cardiovascular: An irregular rhythm present.  Murmur heard.  Diastolic murmur is present  Pulses:      Carotid pulses are 2+ on the right side, and 2+ on the left side.      Radial pulses are 2+ on the right side, and 2+ on the left side.       Femoral pulses are 2+ on the right side, and 2+ on the left side.      Popliteal pulses are 2+ on the  right side, and 2+ on the left side.       Dorsalis pedis pulses are 2+ on the right side, and 2+ on the left side.       Posterior tibial pulses are 2+ on the right side, and 2+ on the left side.  Pulmonary/Chest: Effort normal and breath sounds normal.  Abdominal: Soft. Bowel sounds are normal.       BP (!) 160/102   Pulse 71   Temp 98.7 F (37.1 C) (Oral)   Resp 16   Ht 5' 10"  (1.778 m)   Wt 210 lb (95.3 kg)   SpO2 99%   BMI 30.13 kg/m  Assessment & Plan:     This encounter was created in error - please disregard.

## 2016-12-03 ENCOUNTER — Other Ambulatory Visit: Payer: Self-pay

## 2016-12-03 DIAGNOSIS — N184 Chronic kidney disease, stage 4 (severe): Principal | ICD-10-CM

## 2016-12-03 DIAGNOSIS — I129 Hypertensive chronic kidney disease with stage 1 through stage 4 chronic kidney disease, or unspecified chronic kidney disease: Secondary | ICD-10-CM

## 2016-12-09 ENCOUNTER — Ambulatory Visit (INDEPENDENT_AMBULATORY_CARE_PROVIDER_SITE_OTHER): Payer: Medicaid Other | Admitting: Pharmacist

## 2016-12-09 VITALS — BP 150/100 | HR 65

## 2016-12-09 DIAGNOSIS — I10 Essential (primary) hypertension: Secondary | ICD-10-CM | POA: Diagnosis not present

## 2016-12-09 DIAGNOSIS — I5032 Chronic diastolic (congestive) heart failure: Secondary | ICD-10-CM | POA: Diagnosis not present

## 2016-12-09 MED ORDER — HYDRALAZINE HCL 100 MG PO TABS: 100.0000 mg | ORAL_TABLET | Freq: Three times a day (TID) | ORAL | 11 refills | Status: DC

## 2016-12-09 MED ORDER — CARVEDILOL 25 MG PO TABS: 25.0000 mg | ORAL_TABLET | Freq: Two times a day (BID) | ORAL | 11 refills | Status: DC

## 2016-12-09 MED ORDER — FUROSEMIDE 80 MG PO TABS: ORAL_TABLET | ORAL | 11 refills | Status: DC

## 2016-12-09 NOTE — Patient Instructions (Addendum)
Pick up carvedilol and furosemide from [facility] - they will be $4 for a 30 day supply. For the Lasix (furosemide), take 1/2 tablet twice a day   Increase hydralazine to 142m 3 times a day- you can take 2 of the 518mtablets 3 times a day until you run out.  Call Medicaid to see where they are in the process with your application - this will help usKoreae able to use different medicines for your blood pressure once you are covered.  Call Chantz Montefusco in blood pressure clinic once you have received your [facility] #[phone]

## 2016-12-09 NOTE — Progress Notes (Signed)
Patient ID: Albert Eaton                 DOB: 10-13-1966                      MRN: [id number]     HPI: Albert Eaton is a 51 y.o. male patient of Dr [name] referred by Robbie Lis to HTN clinic. PMH is significant for nonischemic non-dilated cardiomyopathy, chronic diastolic CHF with LVEF 36%, CKD stage III-IV, HTN, HLD, OSA and HIV. Pt has been referred to [facility] but has been unable to make an appt because he does not have insurance. He is in the process of applying for Medicaid.   Pt had a renal artery scan 05/07/15 at [facility]: "No elevated velocities. However velocities of each renal artery are somewhat diminutive. The significance of this uncertain. Further evaluation with CTA or MRA of the renal vessels could be obtained for complete evaluation."  Pt doesn't check his BP too frequently. He felt like his BP dropped low the other week and he felt cold. He checked his BP at that time and it was 114/70s. He does not regularly check his BP else wise. He has difficulty affording most of his medications because he does not have insurance. He is in the process of applying for Medicaid. He receives his HIV medications for free from the ADAP program which has a list of covered medications, but many of the medications he needs are not on the list. His carvedilol and furosemide are on the [facility] $4 list. Since furosemide is only covered as 30 tabs for 30 day supply, will need to switch to 38m strength and have pt take 1/2 tab BID.  There was also a question at his last of if he was taking clonidine. He states that he never started it because it was too expensive. Pt has CKD stage IV - SCr 2.82, CrCl 42.  Current HTN meds: amlodipine 141mdaily, carvedilol 2512mID, Imdur 73m22mily, hydralazine 75mg46m, furosemide 40mg 78mBP goal: <130/80mmHg65mmily History: The patient's family history includes Heart attack in his maternal grandmother; Heart failure in his mother;  Hypertension in his brother and sister.   Social History: Former smoker, quit in 2000. 14 shots of liquor per week. Denies illicit drug use.  Diet: Limits sodium and caffeine intake.  Exercise: Walks frequently.   Home BP readings:   Wt Readings from Last 3 Encounters:  12/02/16 211 lb (95.7 kg)  12/02/16 210 lb (95.3 kg)  12/01/16 210 lb (95.3 kg)   BP Readings from Last 3 Encounters:  12/02/16 (!) 150/100  12/02/16 (!) 160/102  12/01/16 (!) 145/93   Pulse Readings from Last 3 Encounters:  12/02/16 78  12/02/16 71  12/01/16 66    Renal function: Estimated Creatinine Clearance: 36.4 mL/min (by C-G formula based on SCr of 2.82 mg/dL (H)).  Past Medical History:  Diagnosis Date  . CHF (congestive heart failure) (H[facility])   [place] (chronic kidney disease)   . Gout   . HIV (human immunodeficiency virus infection) ([facility])   [place] (hyper[place])   . HTN (hypertension)   . OSA (obstructive sleep apnea)     Current Outpatient Prescriptions on File Prior to Visit  Medication Sig Dispense Refill  . abacavir (ZIAGEN) 300 MG tablet Take 1 tablet (300 mg total) by mouth daily. 30 tablet 5  . allopurinol (ZYLOPRIM) 100 MG tablet Take 1 tablet (100 mg total)  by mouth daily. 30 tablet 1  . amLODipine (NORVASC) 10 MG tablet Take 1 tablet (10 mg total) by mouth daily. 30 tablet 1  . atorvastatin (LIPITOR) 40 MG tablet Take 1 tablet (40 mg total) by mouth daily. 30 tablet 1  . carvedilol (COREG) 25 MG tablet Take 1 tablet (25 mg total) by mouth 2 (two) times daily with a meal. 60 tablet 1  . colchicine 0.6 MG tablet Take 1 tablet (0.6 mg total) by mouth daily. 10 tablet 0  . furosemide (LASIX) 40 MG tablet Take 40 mg (1 tablet) two times a day. May take an additional 20 mg (1/2 tablet) for shortness of breath or weight gain. 180 tablet 30  . hydrALAZINE (APRESOLINE) 50 MG tablet Take 1.5 tablets (75 mg total) by mouth 3 (three) times daily. 135 tablet 3  . isosorbide mononitrate (IMDUR) 30  MG 24 hr tablet Take 1 tablet (30 mg total) by mouth daily. 90 tablet 3  . lamiVUDine (EPIVIR) 150 MG tablet Take 1 tablet (150 mg total) by mouth daily. 30 tablet 5  . potassium chloride (K-DUR) 10 MEQ tablet Take 1 tablet (10 mEq total) by mouth daily. 30 tablet 5  . raltegravir (ISENTRESS) 400 MG tablet Take 1 tablet (400 mg total) by mouth 2 (two) times daily. 60 tablet 5  . zinc gluconate 50 MG tablet Take 100 mg by mouth daily.     No current facility-administered medications on file prior to visit.     No Known Allergies   Assessment/Plan:  1. Hypertension - BP unchanged from last visit and still above goal <130/11mHg. Will increase hydralazine to 1043mTID. Unlikely that this will bring BP to goal but options are minimal given cost issues. Switched his carvedilol and furosemide to [facility] since they are on the $4 30 day supply program. He is maxed out on all of his current medications, cannot use spironolactone due to SCr >2.5. While ACEi/ARB are preferred in CKD, pt have stage IV so would hesitate to start therapy at this time. Advised pt to call Medicaid to see how soon he can begin to receive services. This will allow him to see a renal doctor and we would be able to start clonidine at that time. Advised pt to call clinic once he is on Medicaid because until then, do not have other medication options for his BP due to cost restrictions.   Reve Crocket E. Liev Brockbank, PharmD, CPP, [id number]. [address] [address]hone: [phone]: [phone]/13/2018 10:31 AM

## 2016-12-17 ENCOUNTER — Encounter: Payer: Self-pay | Admitting: Infectious Diseases

## 2016-12-17 ENCOUNTER — Other Ambulatory Visit: Payer: Self-pay | Admitting: *Deleted

## 2016-12-17 DIAGNOSIS — I129 Hypertensive chronic kidney disease with stage 1 through stage 4 chronic kidney disease, or unspecified chronic kidney disease: Secondary | ICD-10-CM

## 2016-12-17 DIAGNOSIS — N184 Chronic kidney disease, stage 4 (severe): Principal | ICD-10-CM

## 2016-12-17 LAB — BASIC METABOLIC PANEL
BUN / CREAT RATIO: 10 (ref 9–20)
BUN: 25 mg/dL — ABNORMAL HIGH (ref 6–24)
CHLORIDE: 96 mmol/L (ref 96–106)
CO2: 24 mmol/L (ref 18–29)
Calcium: 9.7 mg/dL (ref 8.7–10.2)
Creatinine, Ser: 2.42 mg/dL — ABNORMAL HIGH (ref 0.76–1.27)
GFR calc non Af Amer: 30 — ABNORMAL LOW (ref >59)
GFR, EST AFRICAN AMERICAN: 35 — AB (ref >59)
GLUCOSE: 103 mg/dL — AB (ref 65–99)
POTASSIUM: 3.1 mmol/L — AB (ref 3.5–5.2)
SODIUM: 140 mmol/L (ref 134–144)

## 2016-12-19 ENCOUNTER — Telehealth: Payer: Self-pay | Admitting: Infectious Diseases

## 2016-12-19 NOTE — Telephone Encounter (Signed)
Attempted to call pt about his arm numbness.  His voicemail is full and not accepting new messages.

## 2016-12-20 ENCOUNTER — Encounter: Payer: Self-pay | Admitting: Family Medicine

## 2016-12-25 ENCOUNTER — Encounter: Payer: Self-pay | Admitting: Family Medicine

## 2016-12-25 ENCOUNTER — Ambulatory Visit (INDEPENDENT_AMBULATORY_CARE_PROVIDER_SITE_OTHER): Payer: Medicaid Other | Admitting: Family Medicine

## 2016-12-25 VITALS — BP 152/96 | HR 103 | Temp 98.4°F | Resp 16 | Ht 70.0 in | Wt 211.0 lb

## 2016-12-25 DIAGNOSIS — R300 Dysuria: Secondary | ICD-10-CM

## 2016-12-25 DIAGNOSIS — I10 Essential (primary) hypertension: Secondary | ICD-10-CM | POA: Diagnosis not present

## 2016-12-25 DIAGNOSIS — G609 Hereditary and idiopathic neuropathy, unspecified: Secondary | ICD-10-CM | POA: Diagnosis not present

## 2016-12-25 DIAGNOSIS — B20 Human immunodeficiency virus [HIV] disease: Secondary | ICD-10-CM

## 2016-12-25 DIAGNOSIS — N184 Chronic kidney disease, stage 4 (severe): Secondary | ICD-10-CM | POA: Diagnosis not present

## 2016-12-25 DIAGNOSIS — I129 Hypertensive chronic kidney disease with stage 1 through stage 4 chronic kidney disease, or unspecified chronic kidney disease: Secondary | ICD-10-CM | POA: Diagnosis not present

## 2016-12-25 MED ORDER — CLONIDINE HCL 0.2 MG PO TABS
0.2000 mg | ORAL_TABLET | Freq: Every day | ORAL | Status: DC
  Administered 2016-12-25: 0.2 mg via ORAL

## 2016-12-25 MED ORDER — CLONIDINE HCL 0.2 MG PO TABS: 0.2000 mg | ORAL_TABLET | Freq: Once | ORAL | Status: DC

## 2016-12-25 MED ORDER — GABAPENTIN 300 MG PO CAPS: 300.0000 mg | ORAL_CAPSULE | Freq: Three times a day (TID) | ORAL | 3 refills | Status: DC

## 2016-12-25 NOTE — Progress Notes (Signed)
Subjective:    Patient ID: Albert Eaton, male    DOB: [birth year], 51 y.o.   MRN: [id number]  HPI [name], a 51 year old male with a history of malignant hypertension, CHF and HIV present to establish care. [name] presents complaining of numbness and tingling to upper and lower extremities. He has a history of HIV and takes antiviral medications consistently.  Onset of symptoms was gradual, starting about 3 weeks ago. Symptoms are currently of moderate severity. The patient denies headaches, dizziness, recent falls. . Symptoms are symmetrical.  Patient is also complaining of periodic chest pains. Blood pressure was markedly elevated on arrival. He is under the care of cardiology for malignant hypertension. He says that he has been taking anti-hypertensive medications consistently and does not have a follow up cardiology appointment scheduled. Chest pain does not radiate and occurs intermittently.  He is not exercising and is not adherent to low salt diet. He does not check blood pressures at home.Patient denies chest pain, dyspnea, fatigue, irregular heart beat, lower extremity edema, orthopnea, palpitations, syncope and tachypnea.  Cardiovascular risk factors: dyslipidemia, male gender, obesity (BMI >= 30 kg/m2) and sedentary lifestyle.  Past Medical History:  Diagnosis Date  . CHF (congestive heart failure) ([place])   . CKD (chronic kidney disease)   . Gout   . HIV (human immunodeficiency virus infection) ([place])   . HLD (hyperlipidemia)   . HTN (hypertension)   . OSA (obstructive sleep apnea)    Immunization History  Administered Date(s) Administered  . Influenza,inj,Quad PF,36+ Mos 10/10/2016  . Meningococcal Mcv4o 10/15/2016  . Pneumococcal Polysaccharide-23 10/15/2016   Social History   Social History Narrative  . No narrative on file    Review of Systems  Constitutional: Negative.   HENT: Negative.   Eyes: Negative.   Respiratory: Negative.  Negative for chest  tightness and shortness of breath.   Cardiovascular: Positive for palpitations. Negative for leg swelling.  Endocrine: Negative.  Negative for polydipsia, polyphagia and polyuria.  Genitourinary: Negative.   Musculoskeletal: Negative.   Skin: Negative.   Allergic/Immunologic: Positive for immunocompromised state (HIV +).  Neurological: Positive for numbness (bilateral upper extremities).  Hematological: Negative.   Psychiatric/Behavioral: Negative.         Objective:   Physical Exam  HENT:  Head: Normocephalic and atraumatic.  Right Ear: External ear normal.  Left Ear: External ear normal.  Nose: Nose normal.  Mouth/Throat: Oropharynx is clear and moist.  Eyes: Conjunctivae and EOM are normal. Pupils are equal, round, and reactive to light.  Neck: Normal range of motion. Neck supple.  Cardiovascular: An irregular rhythm present.  Murmur heard.  Diastolic murmur is present  Pulses:      Carotid pulses are 2+ on the right side, and 2+ on the left side.      Radial pulses are 2+ on the right side, and 2+ on the left side.       Femoral pulses are 2+ on the right side, and 2+ on the left side.      Popliteal pulses are 2+ on the right side, and 2+ on the left side.       Dorsalis pedis pulses are 2+ on the right side, and 2+ on the left side.       Posterior tibial pulses are 2+ on the right side, and 2+ on the left side.  Pulmonary/Chest: Effort normal and breath sounds normal.  Abdominal: Soft. Bowel sounds are normal.  BP (!) 152/96 (BP Location: Right Arm, Patient Position: Sitting, Cuff Size: Normal)   Pulse (!) 103   Temp 98.4 F (36.9 C) (Oral)   Resp 16   Ht 5' 10"  (1.778 m)   Wt 211 lb (95.7 kg)   SpO2 99%   BMI 30.28 kg/m  Assessment & Plan:  1. Accelerated hypertension Blood pressure is markedly elevated. Recommend that patient follow up at the emergency department for further evaluation if blood pressure continues to increase. Patient is currently  assymptomatic. Blood pressure improved to 152/96 following Clonidine 0.2 mg in office. Patient also has an abnormal EKG with occasional PVCs. Called cardiology and scheduled follow up appointment for Tuesday, [place], 15th at 9:15. Patient expressed understanding.  The patient was given clear instructions to go to ER or return to medical center if symptoms do not improve, worsen or new problems develop. The patient verbalized understanding. - cloNIDine (CATAPRES) tablet 0.2 mg; Take 1 tablet (0.2 mg total) by mouth once.  2. Dysuria Urinalysis unremarkable - GC/Chlamydia Probe Amp  3. Idiopathic peripheral neuropathy Will start a trial of gabapentin and follow up in office in 1 month.  - gabapentin (NEURONTIN) 300 MG capsule; Take 1 capsule (300 mg total) by mouth 3 (three) times daily.  Dispense: 90 capsule; Refill: 3  4.  CKD stage 4 secondary to hypertension [facility]) Previously sent referral to neurology  5. HIV disease ([place]) Recommend that Mr. Budzynski follow up with Dr. Johnnye Sima as previously scheduled   RTC: Patient will follow up in office in 1 week for blood pressure check   Chen Holzman M, FNP    Preventative maintenance:  Patient has not had a colonoscopy  Greater than 1 year since last prostate exam Last eye exam was December 2014

## 2016-12-26 LAB — GC/CHLAMYDIA PROBE AMP
CT Probe RNA: NOT DETECTED
GC Probe RNA: NOT DETECTED

## 2016-12-29 ENCOUNTER — Other Ambulatory Visit: Payer: Self-pay

## 2016-12-29 ENCOUNTER — Encounter: Payer: Self-pay | Admitting: Family Medicine

## 2016-12-29 ENCOUNTER — Encounter: Payer: Self-pay | Admitting: *Deleted

## 2016-12-29 DIAGNOSIS — M109 Gout, unspecified: Secondary | ICD-10-CM

## 2016-12-29 MED ORDER — ALLOPURINOL 100 MG PO TABS: 100.0000 mg | ORAL_TABLET | Freq: Every day | ORAL | 1 refills | Status: DC

## 2017-01-01 ENCOUNTER — Other Ambulatory Visit: Payer: Self-pay | Admitting: Family Medicine

## 2017-01-01 ENCOUNTER — Other Ambulatory Visit (INDEPENDENT_AMBULATORY_CARE_PROVIDER_SITE_OTHER): Payer: Medicaid Other

## 2017-01-01 ENCOUNTER — Encounter: Payer: Self-pay | Admitting: Family Medicine

## 2017-01-01 DIAGNOSIS — M25569 Pain in unspecified knee: Secondary | ICD-10-CM

## 2017-01-01 DIAGNOSIS — M109 Gout, unspecified: Secondary | ICD-10-CM | POA: Diagnosis not present

## 2017-01-01 LAB — URIC ACID: Uric Acid, Serum: 8.7 mg/dL — ABNORMAL HIGH (ref 4.0–8.0)

## 2017-01-02 ENCOUNTER — Other Ambulatory Visit: Payer: Self-pay | Admitting: Family Medicine

## 2017-01-02 DIAGNOSIS — M109 Gout, unspecified: Secondary | ICD-10-CM

## 2017-01-02 LAB — SEDIMENTATION RATE: Sed Rate: 60 mm/hr — ABNORMAL HIGH (ref 0–15)

## 2017-01-02 LAB — RHEUMATOID FACTOR

## 2017-01-02 LAB — C-REACTIVE PROTEIN: CRP: 112.4 mg/L — AB (ref <8.0)

## 2017-01-02 MED ORDER — ALLOPURINOL 100 MG PO TABS: 100.0000 mg | ORAL_TABLET | Freq: Two times a day (BID) | ORAL | 5 refills | Status: DC

## 2017-01-02 MED ORDER — PREDNISONE 10 MG PO TABS: 10.0000 mg | ORAL_TABLET | Freq: Every day | ORAL | 0 refills | Status: DC

## 2017-01-02 NOTE — Progress Notes (Signed)
Called, no answer. No voicemail to leave message will try later. Thanks!

## 2017-01-02 NOTE — Progress Notes (Signed)
The nature of gout is fully explained, including dietary relationship, acute and interval phase and treatment of both. Long term complications such as kidney stones, tophi and arthritis are discussed. Avoidance of alcohol recommended, and written literature is given along with a low purine diet. Indications for the use of allopurinol for prophylaxis and the use of colchicine to prevent or treat flare-ups is also discussed. Proper use of indomethacin for acute attacks discussed, and its side effects. Call if further attacks occur, or this one does not resolve promptly.  Meds ordered this encounter  Medications  . allopurinol (ZYLOPRIM) 100 MG tablet    Sig: Take 1 tablet (100 mg total) by mouth 2 (two) times daily.    Dispense:  60 tablet    Refill:  5  . predniSONE (DELTASONE) 10 MG tablet    Sig: Take 1 tablet (10 mg total) by mouth daily with breakfast. 60 mg day 1, 50 mg day 2, 40 mg day 3, 30 mg day 4, 20 mg day 5, and 10 mg day 6    Dispense:  21 tablet    Refill:  0    Mohab Ashby M, FNP

## 2017-01-02 NOTE — Progress Notes (Signed)
Called, no answer. No voicemail to leave message.

## 2017-01-05 NOTE — Progress Notes (Signed)
Called and spoke with patient, advised of gout flare up and to take medications (allopurinol and prednisone) as directed. Patient verbalized understanding and had no other questions at this point. Thanks!

## 2017-01-06 ENCOUNTER — Ambulatory Visit: Payer: Self-pay | Admitting: Physician Assistant

## 2017-01-06 ENCOUNTER — Encounter: Payer: Self-pay | Admitting: Physician Assistant

## 2017-01-06 ENCOUNTER — Ambulatory Visit (INDEPENDENT_AMBULATORY_CARE_PROVIDER_SITE_OTHER): Payer: Medicaid Other | Admitting: Physician Assistant

## 2017-01-06 VITALS — BP 190/100 | HR 97 | Ht 70.0 in | Wt 207.4 lb

## 2017-01-06 DIAGNOSIS — G4733 Obstructive sleep apnea (adult) (pediatric): Secondary | ICD-10-CM | POA: Diagnosis not present

## 2017-01-06 DIAGNOSIS — I129 Hypertensive chronic kidney disease with stage 1 through stage 4 chronic kidney disease, or unspecified chronic kidney disease: Secondary | ICD-10-CM | POA: Diagnosis not present

## 2017-01-06 DIAGNOSIS — R0789 Other chest pain: Secondary | ICD-10-CM

## 2017-01-06 DIAGNOSIS — I5032 Chronic diastolic (congestive) heart failure: Secondary | ICD-10-CM

## 2017-01-06 DIAGNOSIS — I110 Hypertensive heart disease with heart failure: Secondary | ICD-10-CM

## 2017-01-06 DIAGNOSIS — N184 Chronic kidney disease, stage 4 (severe): Secondary | ICD-10-CM

## 2017-01-06 HISTORY — DX: Chronic diastolic (congestive) heart failure: I50.32

## 2017-01-06 MED ORDER — CLONIDINE HCL 0.1 MG PO TABS: 0.1000 mg | ORAL_TABLET | Freq: Two times a day (BID) | ORAL | 11 refills | Status: DC

## 2017-01-06 MED ORDER — CLONIDINE HCL 0.1 MG PO TABS: 0.1000 mg | ORAL_TABLET | Freq: Once | ORAL | 11 refills | Status: DC

## 2017-01-06 MED ORDER — FAMOTIDINE 20 MG PO TABS: 20.0000 mg | ORAL_TABLET | Freq: Two times a day (BID) | ORAL | 11 refills | Status: DC

## 2017-01-06 NOTE — Patient Instructions (Addendum)
Medication Instructions:  Your physician has recommended you make the following change in your medication: Start Clonidine 0.23m twice daily and Pepcid 235mtwice daily.   Labwork: Your physician recommends that you return for lab work today; BMET, Magnesium, BNP  Testing/Procedures: None ordered   Follow-Up: Your physician recommends that you schedule a follow-up appointment in: 3 weeks with ScRichardson DoppA-C  Any Other Special Instructions Will Be Listed Below (If Applicable).  Please track BP for the next few weeks until your next visit. Please write them down and bring results with you.  If you need a refill on your cardiac medications before your next appointment, please call your pharmacy.

## 2017-01-06 NOTE — Progress Notes (Signed)
Cardiology Office Note:    Date:  01/06/2017   ID:  Albert Eaton, DOB 09/18/1966, MRN [id number]  PCP:  [name], FNP  Cardiologist:  Dr. [name]   Electrophysiologist:  n/a ID: Dr. Johnnye Sima  Referring MD: [name], FNP   Chief Complaint  Patient presents with  . Follow-up    CHF, HTN    History of Present Illness:    Albert Eaton is a 51 y.o. male with a hx of diastolic CHF, CKD stage 3-4, HTN, HL, OSA, HIV.  He was previously followed by Cardiology in [place], [name] prior to moving to [place].  He was admitted in 09/2016 with chest pain and elevated Troponin levels in the setting of HTN urgency and CKD.  Echo showed mod diastolic dysfunction and mild MR.  Inpatient Myocardial Perfusion Imaging Study was low risk with a possible very small area of apical ischemia.  Medical Rx was felt to be appropriate, especially given his high risk for contrast induced nephropathy with baseline CKD.    He has FU in clinic with Leanor Kail, PA-C x 2.  Last seen 2/18.  He was also seen in the HTN Clinic in 2/18.  Patient was referred to Nephrology but he cancelled/no showed twice.  He is required to call them to reschedule.  Patient was seen 12/25/16 by PCP.  Notes indicate his blood pressure was markedly elevated but improved after Clonidine was given in the office (BP 152/96 after med given).    He returns for follow up.  He is here alone.  Since last seen, he notes symptoms of chest tightness. He mainly notes this after taking all his medications. He denies associated symptoms. This is the same pain he went to the hospital for in December. He denies any significant change or worsening symptoms. He denies exertional chest pain. He notes dyspnea with exertion that is fairly stable. Weights at home have been stable. He denies significant pedal edema. He sleeps on 2-3 pillows. He denies PND. He denies syncope.  Prior CV studies:   The following studies were  reviewed today:  Myocardial Perfusion Imaging Study 10/10/16 EF 48, possible very small area of apical ischemia, low risk  Echo 10/09/16 Moderate LVH, grade 2 diastolic dysfunction, mild MR, moderate LAE  Echo 7/16 [facility] in [place], [name]) Mild AI, mild LAE, mild concentric LVH, EF 55, normal wall motion, mild to moderate MR, mild PI, RVSP 55  Myocardial Perfusion Imaging Study 7/16 [facility] in [place], [name]) Impression: 1.Normal exercise nuclear stress test revealing no evidence of ischemia or scar. 2.Abnormal gated wall motion study revealing moderate global hypokinesis. 3.Abnormal left ventricular ejection fraction of 42%.  Past Medical History:  Diagnosis Date  . Chronic diastolic CHF (congestive heart failure) ([place]) 01/06/2017   Echo 7/16 [facility] in [place], [name]) Mild AI, mild LAE, mild concentric LVH, EF 55, normal wall motion, mild to moderate MR, mild PI, RVSP 55 // Echo 10/09/16 ([facility]):  Moderate LVH, grade 2 diastolic dysfunction, mild MR, moderate LAE   . CKD (chronic kidney disease)   . Gout   . History of nuclear stress test    a. Nuc study 7/16: no scar or ischemia, EF 42 // b. Nuc study 12/17: EF 48, ?small apical ischemia, Low Risk  . HIV (human immunodeficiency virus infection) ([place])   . HLD (hyperlipidemia)   . Hypertensive heart disease with CHF (congestive heart failure) ([place]) 10/09/2016  . OSA (obstructive sleep apnea)  Past Surgical History:  Procedure Laterality Date  . CHOLECYSTECTOMY    . HERNIA REPAIR     As baby    Current Medications: Current Meds  Medication Sig  . abacavir (ZIAGEN) 300 MG tablet Take 1 tablet (300 mg total) by mouth daily.  Marland Kitchen allopurinol (ZYLOPRIM) 100 MG tablet Take 1 tablet (100 mg total) by mouth 2 (two) times daily.  Marland Kitchen amLODipine (NORVASC) 10 MG tablet Take 1 tablet (10 mg total) by mouth daily.  Marland Kitchen atorvastatin (LIPITOR) 40 MG tablet Take 1 tablet (40 mg total) by mouth daily.  .  carvedilol (COREG) 25 MG tablet Take 1 tablet (25 mg total) by mouth 2 (two) times daily with a meal.  . colchicine 0.6 MG tablet Take 1 tablet (0.6 mg total) by mouth daily.  . furosemide (LASIX) 80 MG tablet Take 40 mg (1/2 tablet) two times a day. May take an additional 20 mg (1/4 tablet) for shortness of breath or weight gain.  Marland Kitchen gabapentin (NEURONTIN) 300 MG capsule Take 1 capsule (300 mg total) by mouth 3 (three) times daily.  . hydrALAZINE (APRESOLINE) 100 MG tablet Take 1 tablet (100 mg total) by mouth 3 (three) times daily.  . isosorbide mononitrate (IMDUR) 30 MG 24 hr tablet Take 1 tablet (30 mg total) by mouth daily.  Marland Kitchen lamiVUDine (EPIVIR) 150 MG tablet Take 1 tablet (150 mg total) by mouth daily.  . potassium chloride (K-DUR) 10 MEQ tablet Take 1 tablet (10 mEq total) by mouth daily.  . predniSONE (DELTASONE) 10 MG tablet Take 1 tablet (10 mg total) by mouth daily with breakfast. 60 mg day 1, 50 mg day 2, 40 mg day 3, 30 mg day 4, 20 mg day 5, and 10 mg day 6  . raltegravir (ISENTRESS) 400 MG tablet Take 1 tablet (400 mg total) by mouth 2 (two) times daily.  Marland Kitchen zinc gluconate 50 MG tablet Take 100 mg by mouth daily.     Allergies:   Patient has no known allergies.   Social History   Social History  . Marital status: Unknown    Spouse name: N/A  . Number of children: N/A  . Years of education: N/A   Social History Main Topics  . Smoking status: Former Smoker    Quit date: 2000  . Smokeless tobacco: Former Systems developer    Quit date: 09/10/1999  . Alcohol use 8.4 oz/week    14 Shots of liquor per week     Comment: Light use  . Drug use: No  . Sexual activity: No     Comment: declined condoms   Other Topics Concern  . None   Social History Narrative  . None     Family History  Problem Relation Age of Onset  . Heart failure Mother   . Heart attack Maternal Grandmother   . Hypertension Sister   . Hypertension Brother      ROS:   Please see the history of present  illness.    Review of Systems  Constitution: Positive for chills, fever and malaise/fatigue.  Cardiovascular: Positive for chest pain, irregular heartbeat and leg swelling.  Respiratory: Positive for shortness of breath.   Musculoskeletal: Positive for joint pain, joint swelling and myalgias.  Gastrointestinal: Positive for abdominal pain.  Neurological: Positive for headaches and loss of balance.   All other systems reviewed and are negative.   EKGs/Labs/Other Test Reviewed:    EKG:  EKG is  ordered today.  The ekg ordered today demonstrates NSR,  HR 97, normal axis, PVC, QTc 508 ms, NSSTTW changes, similar to prior tracing  Recent Labs: 10/09/2016: B Natriuretic Peptide 454.6 10/15/2016: Hemoglobin 13.4; Platelets 257 10/31/2016: ALT 13 12/17/2016: BUN 25; Creatinine, Ser 2.42; Potassium 3.1; Sodium 140   Recent Lipid Panel    Component Value Date/Time   CHOL 136 10/15/2016 1011   TRIG 94 10/15/2016 1011   HDL 49 10/15/2016 1011   CHOLHDL 2.8 10/15/2016 1011   VLDL 19 10/15/2016 1011   LDLCALC 68 10/15/2016 1011     Physical Exam:    VS:  BP (!) 190/100 (BP Location: Left Arm, Patient Position: Sitting, Cuff Size: Normal)   Pulse 97   Ht 5' 10"  (1.778 m)   Wt 207 lb 6.4 oz (94.1 kg)   BMI 29.76 kg/m     Wt Readings from Last 3 Encounters:  01/06/17 207 lb 6.4 oz (94.1 kg)  12/25/16 211 lb (95.7 kg)  12/02/16 211 lb (95.7 kg)     Physical Exam  Constitutional: He is oriented to person, place, and time. He appears well-developed and well-nourished. No distress.  HENT:  Head: Normocephalic and atraumatic.  Eyes: No scleral icterus.  Neck: No JVD present.  Cardiovascular: Normal rate and regular rhythm.   Murmur heard.  Medium-pitched systolic murmur is present with a grade of 2/6  at the upper right sternal border Pulmonary/Chest: He has no wheezes. He has no rales.  Abdominal: There is no tenderness.  Musculoskeletal: He exhibits no edema.  Neurological: He is  alert and oriented to person, place, and time.  Skin: Skin is warm and dry.  Psychiatric: He has a normal mood and affect.    ASSESSMENT:    1. Hypertensive heart disease with CHF (congestive heart failure) ([place])   2. Chronic diastolic CHF (congestive heart failure) ([place])   3. CKD stage 4 secondary to hypertension ([place])   4. OSA (obstructive sleep apnea)   5. Other chest pain    PLAN:    In order of problems listed above:  1. Hypertensive heart disease with CHF (congestive heart failure) ([place]) - His BP remains uncontrolled on max dose Norvasc, Coreg, Hydralazine.  He is also on Isosorbide, Lasix.  He is not using CPAP for OSA.  He is waiting on insurance.  He is maintaining a low salt diet.  He admits to adherence with his medications.  QTc is prolonged.  Question if his K+ is low.  -  Add Clonidine 0.1 mg bid  -  BMET, Mg2+ today  -  If K+ low, consider adding Spironolactone.   2. Chronic diastolic CHF (congestive heart failure) ([facility]) -  Volume stable on exam today.  Will get BNP.  If BNP up, consider increasing Lasix vs adding Spironolactone (if K+ is low).  He is NYHA 2-2b.  3. CKD stage 4 secondary to hypertension [facility]) -  He needs follow up with Nephrology.  We will provide the phone # to [place] for him.  4. OSA (obstructive sleep apnea) -  Refer to Dr. Fransico Him once he has insurance.   5. Chest Pain -  Atypical chest pain.  Symptoms mainly occur with taking medications. Question if due to acid reflux.  Will have him start Pepcid 20 mg bid.    Dispo:  Return in about 3 weeks (around 01/27/2017) for Close Follow Up, w/ Richardson Dopp, PA-C.    Medication Adjustments/Labs and Tests Ordered: Current medicines are reviewed at length with the patient today.  Concerns regarding  medicines are outlined above.  Medication changes, Labs and Tests ordered today are outlined in the Patient Instructions noted below. Patient Instructions  Medication Instructions:  Your  physician has recommended you make the following change in your medication: Start Clonidine 0.23m twice daily and Pepcid 250mtwice daily.   Labwork: Your physician recommends that you return for lab work today; BMET, Magnesium, BNP  Testing/Procedures: None ordered   Follow-Up: Your physician recommends that you schedule a follow-up appointment in: 3 weeks with ScRichardson DoppA-C  Any Other Special Instructions Will Be Listed Below (If Applicable).  Please track BP for the next few weeks until your next visit. Please write them down and bring results with you.  If you need a refill on your cardiac medications before your next appointment, please call your pharmacy.  Signed, ScRichardson DoppPA-C  01/06/2017 4:32 PM    [facility] [address]  [id number]: [phone]: [phone]

## 2017-01-07 ENCOUNTER — Telehealth: Payer: Self-pay | Admitting: *Deleted

## 2017-01-07 DIAGNOSIS — I129 Hypertensive chronic kidney disease with stage 1 through stage 4 chronic kidney disease, or unspecified chronic kidney disease: Secondary | ICD-10-CM

## 2017-01-07 DIAGNOSIS — N184 Chronic kidney disease, stage 4 (severe): Principal | ICD-10-CM

## 2017-01-07 LAB — BASIC METABOLIC PANEL
BUN / CREAT RATIO: 8 — AB (ref 9–20)
BUN: 21 mg/dL (ref 6–24)
CO2: 25 mmol/L (ref 18–29)
CREATININE: 2.77 mg/dL — AB (ref 0.76–1.27)
Calcium: 10.3 mg/dL — ABNORMAL HIGH (ref 8.7–10.2)
Chloride: 93 mmol/L — ABNORMAL LOW (ref 96–106)
GFR calc Af Amer: 29 mL/min/{1.73_m2} — ABNORMAL LOW (ref >59)
GFR, EST NON AFRICAN AMERICAN: 25 mL/min/{1.73_m2} — AB (ref >59)
GLUCOSE: 107 mg/dL — AB (ref 65–99)
Potassium: 3.1 mmol/L — ABNORMAL LOW (ref 3.5–5.2)
SODIUM: 138 mmol/L (ref 134–144)

## 2017-01-07 LAB — MAGNESIUM: MAGNESIUM: 2.2 mg/dL (ref 1.6–2.3)

## 2017-01-07 LAB — PRO B NATRIURETIC PEPTIDE: NT-Pro BNP: 1501 pg/mL — ABNORMAL HIGH (ref 0–121)

## 2017-01-07 NOTE — Telephone Encounter (Signed)
I lmtcb on pt's cell # to go over lab results and medication recommendations. I tried home # as  Well though recording states # disconnected.

## 2017-01-08 MED ORDER — SPIRONOLACTONE 25 MG PO TABS: 12.5000 mg | ORAL_TABLET | Freq: Every day | ORAL | 3 refills | Status: DC

## 2017-01-08 MED ORDER — FUROSEMIDE 20 MG PO TABS: 60.0000 mg | ORAL_TABLET | Freq: Two times a day (BID) | ORAL | 3 refills | Status: DC

## 2017-01-08 NOTE — Telephone Encounter (Signed)
Patient was called and notified of lab results. Patient was made aware of Richardson Dopp, PA recommendations to:  Take extra K 20 mEq x 1 today. Increase Lasix to 60 mg Twice daily  Add Spironolactone 12.5 mg QD BMET Monday 3/19 and repeat Friday 3/23 and Friday 3/30.  Prescriptions sent to patient's preferred pharmacy. Labs ordered and appointments made. Patient verbalized understanding.

## 2017-01-08 NOTE — Telephone Encounter (Deleted)
-----   Message from Michae Kava, [place] sent at 01/07/2017  5:27 PM EDT ----- I lmtcb on pt's cell # to go over lab results and medication recommendations. I tried home # as  Well though recording states # disconnected. I will route this message to Triage as well since I will be out of the office tomorrow. Pt' K+ is low and needs to be address.

## 2017-01-08 NOTE — Telephone Encounter (Signed)
-----   Message from Michae Kava, [place] sent at 01/07/2017  5:27 PM EDT ----- I lmtcb on pt's cell # to go over lab results and medication recommendations. I tried home # as  Well though recording states # disconnected. I will route this message to Triage as well since I will be out of the office tomorrow. Pt' K+ is low and needs to be address.

## 2017-01-12 ENCOUNTER — Other Ambulatory Visit: Payer: Self-pay

## 2017-01-14 IMAGING — US US RENAL
1 series · 14 of 25 positions shown · non-contrast
Comparison: CT [DATE]

CLINICAL DATA: Chronic kidney disease, renal lesions on CT

EXAM:
RENAL / URINARY TRACT ULTRASOUND COMPLETE

[Series 1: us renal · 0.28mm/px · 14 of 51 slices shown]
[im 1/51]
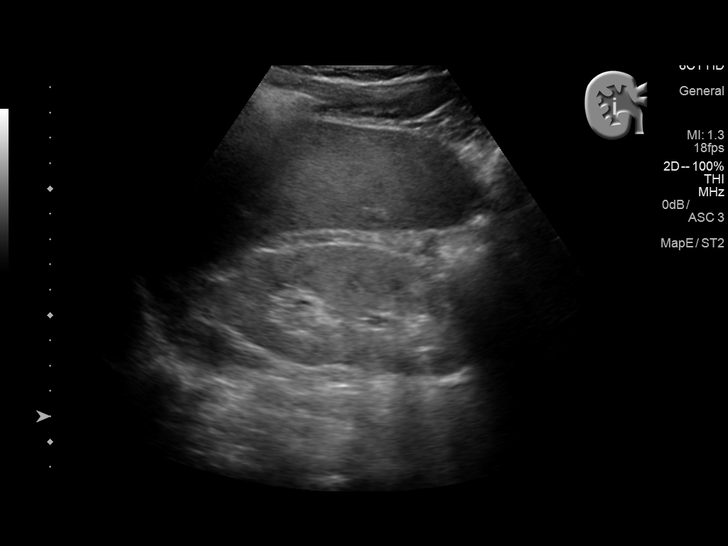
[im 5/51]
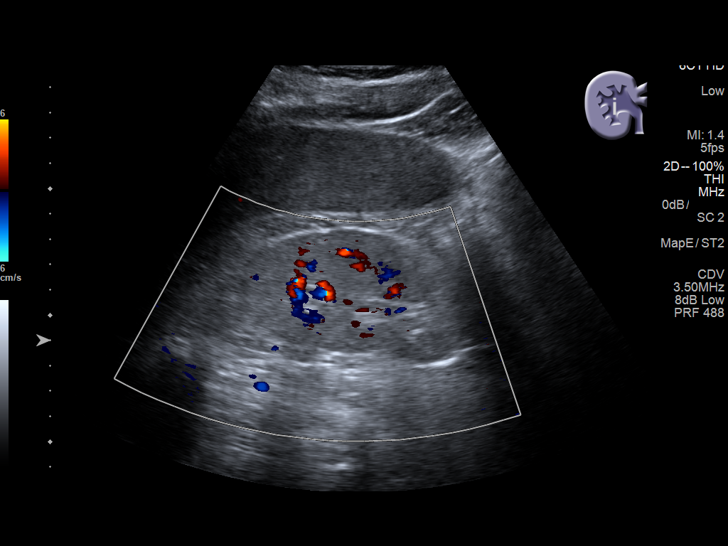
[im 9/51]
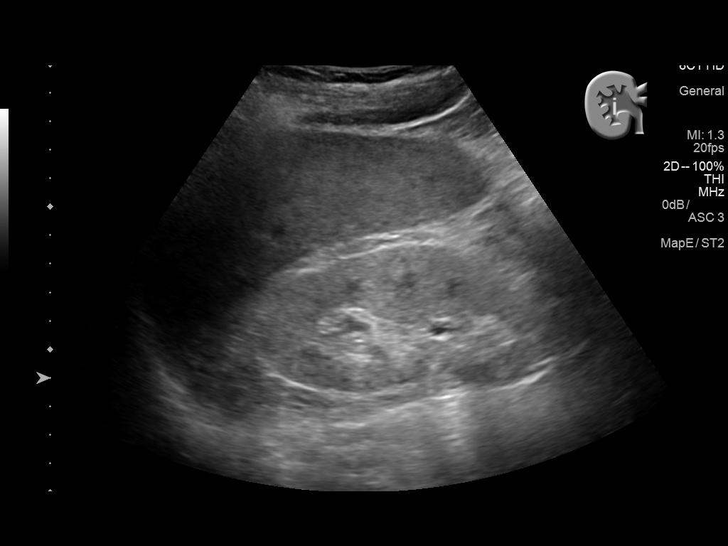
[im 13/51]
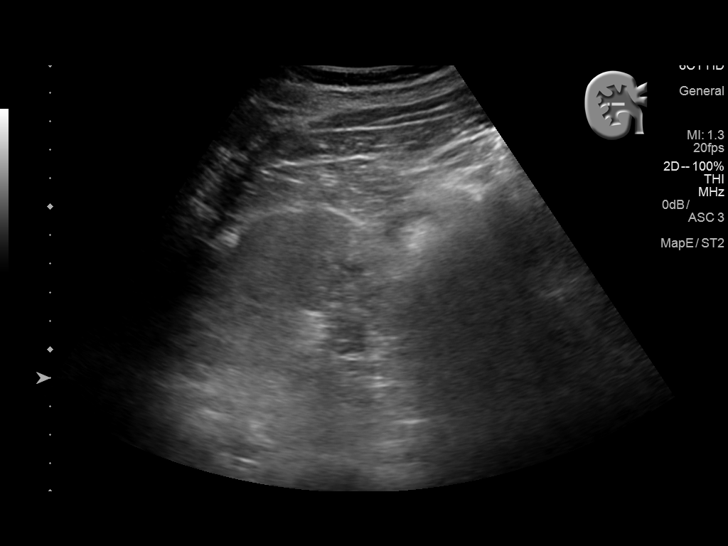
[im 17/51]
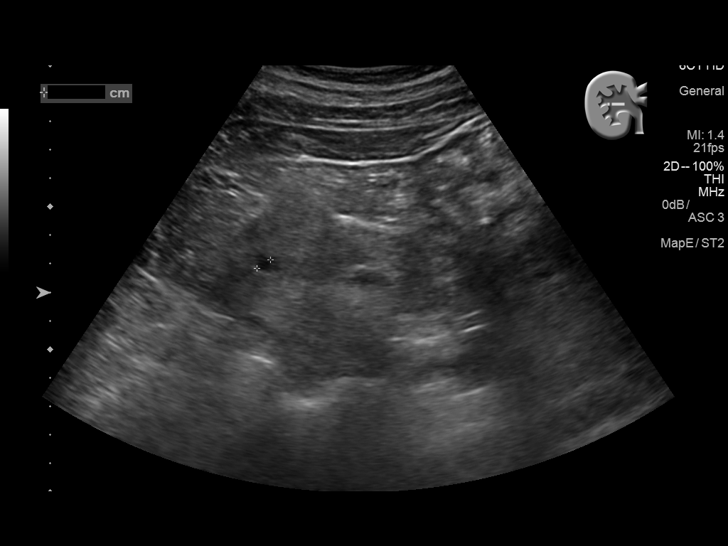
[im 19/51]
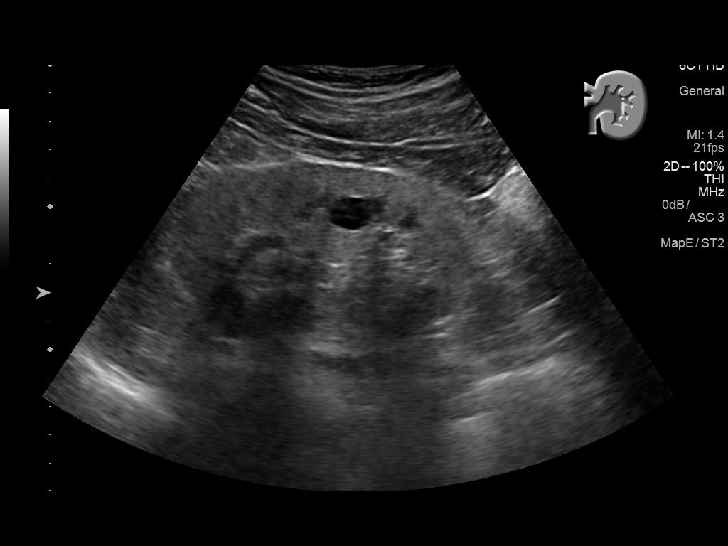
[im 23/51]
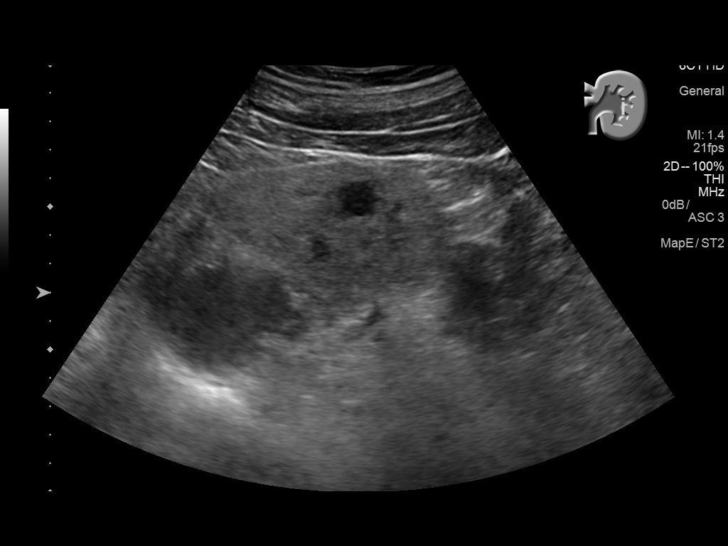
[im 28/51]
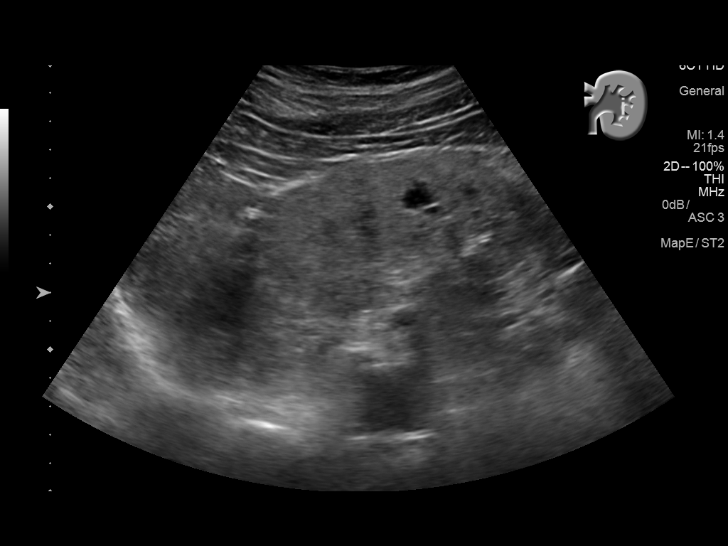
[im 32/51]
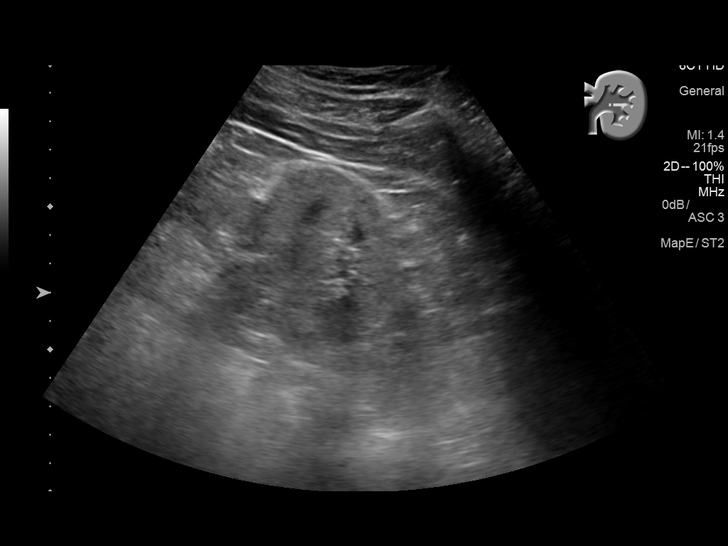
[im 34/51]
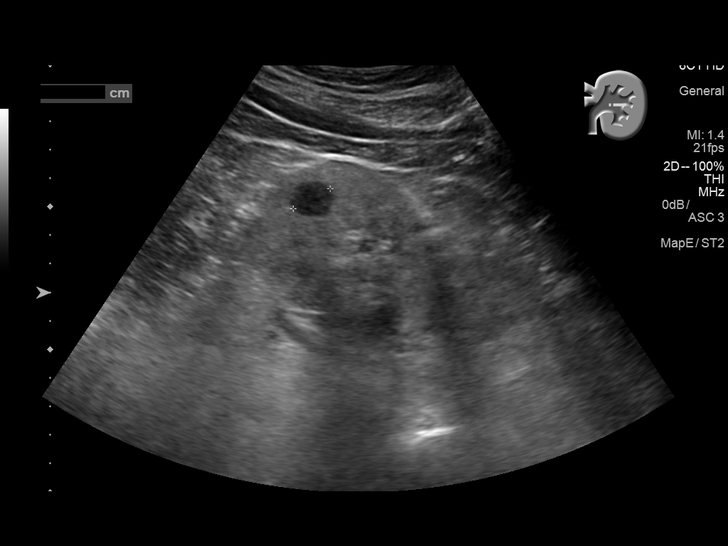
[im 38/51]
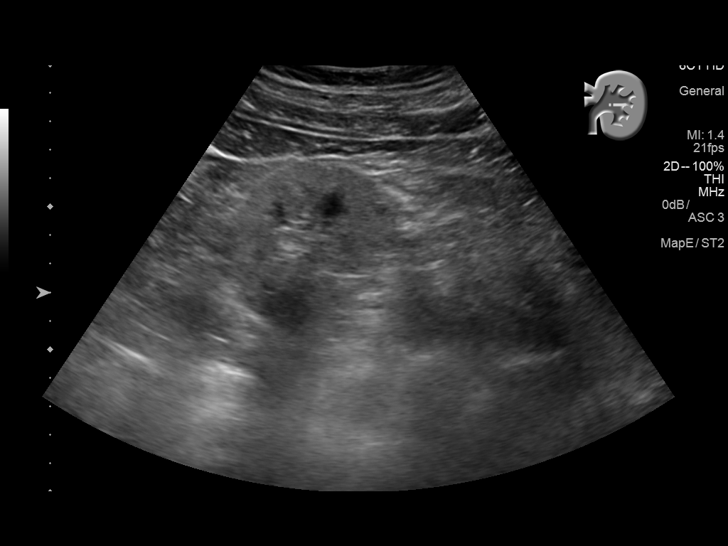
[im 42/51]
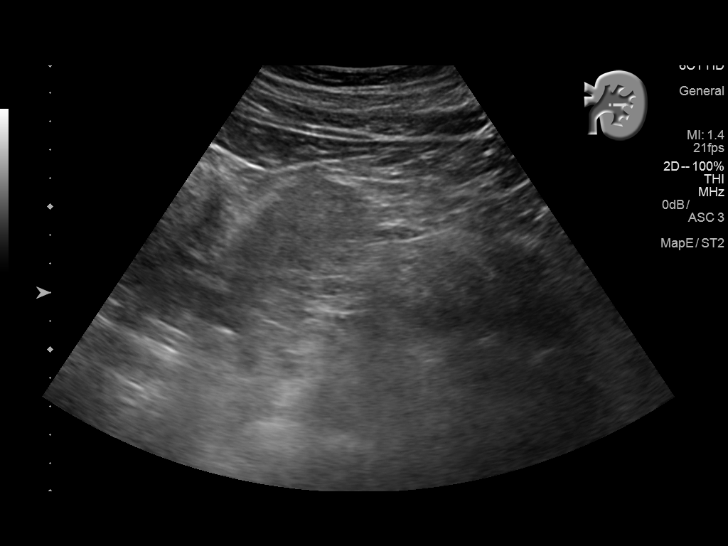
[im 46/51]
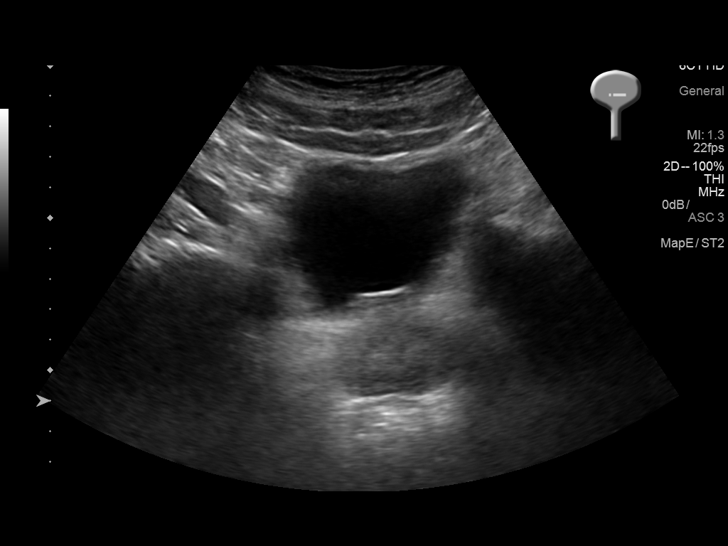
[im 51/51]
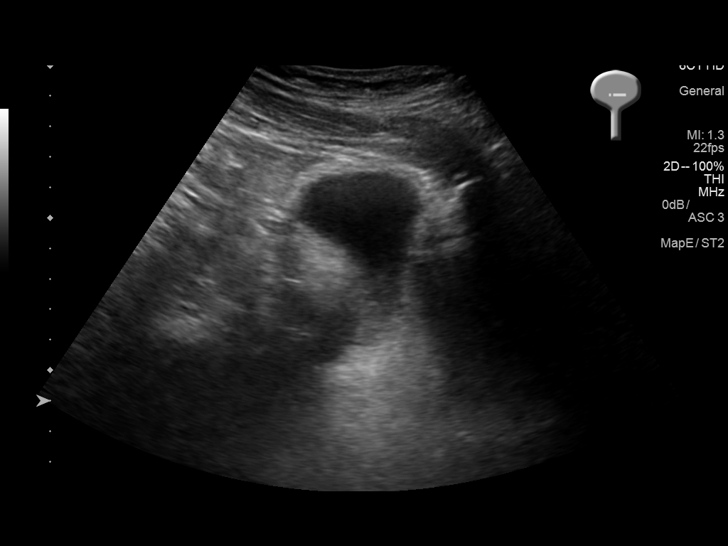

[14 of 25 positions shown; findings below may reference images not displayed]

FINDINGS: Right Kidney:

Length: 10.6 cm. Increased cortical echogenicity. No hydronephrosis.
Small cyst inferior pole measuring 0.8 x 0.7 x 0.6 cm corresponding
to small exophytic CT lesion.

Left Kidney:

Length: 10.8 cm. Increased cortical echogenicity. No hydronephrosis.
There are 2 cystic lesions in the mid left kidney the more inferior
lesion measures 1.6 x 1.2 x 1.5 cm and is consistent with a small
cysts. The slightly more superior lesion measures 1.5 x 1.3 x 1.5 cm
and contains internal scattered echoes.

Bladder:

Appears normal for degree of bladder distention.
IMPRESSION: 1. Increased cortical echogenicity consistent with medical renal
disease. No hydronephrosis.
2. Small simple cyst right kidney corresponds to CT finding.
3. 1.5 cm simple cyst mid left kidney. 1.6 cm indeterminate cystic
lesion in the mid left kidney; 1 of these lesions corresponds to the
finding on CT. Suggest further evaluation with renal protocol CT or
MRI.

## 2017-01-16 ENCOUNTER — Other Ambulatory Visit: Payer: Self-pay

## 2017-01-19 ENCOUNTER — Telehealth: Payer: Self-pay | Admitting: Lab

## 2017-01-19 NOTE — Telephone Encounter (Signed)
12/31/2016-Dr Elmarie Shiley had been assigned to this referral.  Therefore, I reached out to his office, [employer].    I spoke with the Revenue Manager, Andee Poles to see what options were available for our uninsured patient (Medicaid Potential)  They don't except the [place] from [facility] but they do accept the orange card through [facility]. I then called the patient and recommended that he speak with Tye Maryland here in our office, who processed his RW, to make an appointment to apply for the orange card.  He said he would call me back as soon as he receive either the Medicaid or the orange card.  12/31/16-I tried to reach out to the patient, no answer or voice mailbox. 3rd Attempt-today, no answer or voice mailbox.

## 2017-01-23 ENCOUNTER — Other Ambulatory Visit: Payer: Self-pay

## 2017-01-27 ENCOUNTER — Ambulatory Visit (INDEPENDENT_AMBULATORY_CARE_PROVIDER_SITE_OTHER): Payer: Medicaid Other | Admitting: Family Medicine

## 2017-01-27 ENCOUNTER — Encounter: Payer: Self-pay | Admitting: Family Medicine

## 2017-01-27 VITALS — BP 125/80 | HR 75 | Temp 98.0°F | Resp 14 | Ht 70.0 in | Wt 218.0 lb

## 2017-01-27 DIAGNOSIS — M109 Gout, unspecified: Secondary | ICD-10-CM | POA: Diagnosis not present

## 2017-01-27 DIAGNOSIS — Z23 Encounter for immunization: Secondary | ICD-10-CM | POA: Diagnosis not present

## 2017-01-27 DIAGNOSIS — I110 Hypertensive heart disease with heart failure: Secondary | ICD-10-CM | POA: Diagnosis not present

## 2017-01-27 DIAGNOSIS — B20 Human immunodeficiency virus [HIV] disease: Secondary | ICD-10-CM | POA: Diagnosis not present

## 2017-01-27 LAB — COMPLETE METABOLIC PANEL WITH GFR
ALT: 14 U/L (ref 9–46)
AST: 12 U/L (ref 10–35)
Albumin: 3.7 g/dL (ref 3.6–5.1)
Alkaline Phosphatase: 54 U/L (ref 40–115)
BUN: 32 mg/dL — AB (ref 7–25)
CALCIUM: 9.1 mg/dL (ref 8.6–10.3)
CHLORIDE: 102 mmol/L (ref 98–110)
CO2: 26 mmol/L (ref 20–31)
CREATININE: 3.16 mg/dL — AB (ref 0.70–1.33)
GFR, Est African American: 25 mL/min — ABNORMAL LOW (ref >=60)
GFR, Est Non African American: 22 mL/min — ABNORMAL LOW (ref >=60)
GLUCOSE: 131 mg/dL — AB (ref 65–99)
POTASSIUM: 3.4 mmol/L — AB (ref 3.5–5.3)
SODIUM: 141 mmol/L (ref 135–146)
Total Bilirubin: 0.4 mg/dL (ref 0.2–1.2)
Total Protein: 6.5 g/dL (ref 6.1–8.1)

## 2017-01-27 NOTE — Patient Instructions (Addendum)
Low-Purine Diet Purines are compounds that affect the level of uric acid in your body. A low-purine diet is a diet that is low in purines. Eating a low-purine diet can prevent the level of uric acid in your body from getting too high and causing gout or kidney stones or both. What do I need to know about this diet?  Choose low-purine foods. Examples of low-purine foods are listed in the next section.  Drink plenty of fluids, especially water. Fluids can help remove uric acid from your body. Try to drink 8-16 cups (1.9-3.8 L) a day.  Limit foods high in fat, especially saturated fat, as fat makes it harder for the body to get rid of uric acid. Foods high in saturated fat include pizza, cheese, ice cream, whole milk, fried foods, and gravies. Choose foods that are lower in fat and lean sources of protein. Use olive oil when cooking as it contains healthy fats that are not high in saturated fat.  Limit alcohol. Alcohol interferes with the elimination of uric acid from your body. If you are having a gout attack, avoid all alcohol.  Keep in mind that different people's bodies react differently to different foods. You will probably learn over time which foods do or do not affect you. If you discover that a food tends to cause your gout to flare up, avoid eating that food. You can more freely enjoy foods that do not cause problems. If you have any questions about a food item, talk to your dietitian or health care provider. Which foods are low, moderate, and high in purines? The following is a list of foods that are low, moderate, and high in purines. You can eat any amount of the foods that are low in purines. You may be able to have small amounts of foods that are moderate in purines. Ask your health care provider how much of a food moderate in purines you can have. Avoid foods high in purines. Grains   Foods low in purines: Enriched white bread, pasta, rice, cake, cornbread, popcorn.  Foods moderate in  purines: Whole-grain breads and cereals, wheat germ, bran, oatmeal. Uncooked oatmeal. Dry wheat bran or wheat germ.  Foods high in purines: Pancakes, [place] toast, biscuits, muffins. Vegetables   Foods low in purines: All vegetables, except those that are moderate in purines.  Foods moderate in purines: Asparagus, cauliflower, spinach, mushrooms, green peas. Fruits   All fruits are low in purines. Meats and other Protein Foods   Foods low in purines: Eggs, nuts, peanut butter.  Foods moderate in purines: 80-90% lean beef, lamb, veal, pork, poultry, fish, eggs, peanut butter, nuts. Crab, lobster, oysters, and shrimp. Cooked dried beans, peas, and lentils.  Foods high in purines: Anchovies, sardines, herring, mussels, tuna, codfish, scallops, trout, and haddock. Berniece Salines. Organ meats (such as liver or kidney). Tripe. Game meat. Goose. Sweetbreads. Dairy   All dairy foods are low in purines. Low-fat and fat-free dairy products are best because they are low in saturated fat. Beverages   Drinks low in purines: Water, carbonated beverages, tea, coffee, cocoa.  Drinks moderate in purines: Soft drinks and other drinks sweetened with high-fructose corn syrup. Juices. To find whether a food or drink is sweetened with high-fructose corn syrup, look at the ingredients list.  Drinks high in purines: Alcoholic beverages (such as beer). Condiments   Foods low in purines: Salt, herbs, olives, pickles, relishes, vinegar.  Foods moderate in purines: Butter, margarine, oils, mayonnaise. Fats and Oils  Foods low in purines: All types, except gravies and sauces made with meat.  Foods high in purines: Gravies and sauces made with meat. Other Foods   Foods low in purines: Sugars, sweets, gelatin. Cake. Soups made without meat.  Foods moderate in purines: Meat-based or fish-based soups, broths, or bouillons. Foods and drinks sweetened with high-fructose corn syrup.  Foods high in purines: High-fat  desserts (such as ice cream, cookies, cakes, pies, doughnuts, and chocolate). Contact your dietitian for more information on foods that are not listed here.  This information is not intended to replace advice given to you by your health care provider. Make sure you discuss any questions you have with your health care provider. Document Released: 02/07/2011 Document Revised: 03/20/2016 Document Reviewed: 09/19/2013 Elsevier Interactive Patient Education  2017 [place].  Gout Gout is painful swelling that can happen in some of your joints. Gout is a type of arthritis. This condition is caused by having too much uric acid in your body. Uric acid is a chemical that is made when your body breaks down substances called purines. If your body has too much uric acid, sharp crystals can form and build up in your joints. This causes pain and swelling. Gout attacks can happen quickly and be very painful (acute gout). Over time, the attacks can affect more joints and happen more often (chronic gout). Follow these instructions at home: During a Gout Attack   If directed, put ice on the painful area:  Put ice in a plastic bag.  Place a towel between your skin and the bag.  Leave the ice on for 20 minutes, 2-3 times a day.  Rest the joint as much as possible. If the joint is in your leg, you may be given crutches to use.  Raise (elevate) the painful joint above the level of your heart as often as you can.  Drink enough fluids to keep your pee (urine) clear or pale yellow.  Take over-the-counter and prescription medicines only as told by your doctor.  Do not drive or use heavy machinery while taking prescription pain medicine.  Follow instructions from your doctor about what you can or cannot eat and drink.  Return to your normal activities as told by your doctor. Ask your doctor what activities are safe for you. Avoiding Future Gout Attacks   Follow a low-purine diet as told by a specialist  (dietitian) or your doctor. Avoid foods and drinks that have a lot of purines, such as:  Liver.  Kidney.  Anchovies.  Asparagus.  Herring.  Mushrooms  Mussels.  Beer.  Limit alcohol intake to no more than 1 drink a day for nonpregnant women and 2 drinks a day for men. One drink equals 12 oz of beer, 5 oz of wine, or 1 oz of hard liquor.  Stay at a healthy weight or lose weight if you are overweight. If you want to lose weight, talk with your doctor. It is important that you do not lose weight too fast.  Start or continue an exercise plan as told by your doctor.  Drink enough fluids to keep your pee clear or pale yellow.  Take over-the-counter and prescription medicines only as told by your doctor.  Keep all follow-up visits as told by your doctor. This is important. Contact a doctor if:  You have another gout attack.  You still have symptoms of a gout attack after10 days of treatment.  You have problems (side effects) because of your medicines.  You  have chills or a fever.  You have burning pain when you pee (urinate).  You have pain in your lower back or belly. Get help right away if:  You have very bad pain.  Your pain cannot be controlled.  You cannot pee. This information is not intended to replace advice given to you by your health care provider. Make sure you discuss any questions you have with your health care provider. Document Released: 07/22/2008 Document Revised: 03/20/2016 Document Reviewed: 07/26/2015 Elsevier Interactive Patient Education  2017 [employer].

## 2017-01-27 NOTE — Progress Notes (Signed)
Subjective:    Patient ID: Albert Eaton, male    DOB: 02-20-1966, 51 y.o.   MRN: [id number]  HPI   [name], a 51 year old male with a history of hypertension, HIV and gout presents for a follow up of gout. The patient reports that gout pain to lower extremities has improved.  Attacks occur primarily in the ankles bilaterally.  Patient reports his chronic pain has improved since starting Allopurinol and a tapered steroid pack 1 month ago.  His joint stiffness has also improved. He has been following a low purine diet and increased water intake. He states that he is not consuming any alcoholic beverages.  He has a history of HIV and takes antiviral medications consistently.    Patient is also complaining of periodic chest pains. Blood pressure was markedly elevated on arrival. He is under the care of cardiology for malignant hypertension. He says that he has been taking anti-hypertensive medications consistently and has a follow up cardiology appointment scheduled. Chest pain does not radiate and occurs intermittently.  He is not exercising and is not adherent to low salt diet. He does not check blood pressures at home. Patient denies chest pain, dyspnea, fatigue, irregular heart beat, lower extremity edema, orthopnea, palpitations, syncope and tachypnea.  Cardiovascular risk factors: dyslipidemia, male gender, obesity (BMI >= 30 kg/m2) and sedentary lifestyle.  Past Medical History:  Diagnosis Date  . Chronic diastolic CHF (congestive heart failure) ([place]) 01/06/2017   Echo 7/16 [facility] in [place], [name]) Mild AI, mild LAE, mild concentric LVH, EF 55, normal wall motion, mild to moderate MR, mild PI, RVSP 55 // Echo 10/09/16 ([facility]):  Moderate LVH, grade 2 diastolic dysfunction, mild MR, moderate LAE   . CKD (chronic kidney disease)   . Gout   . History of nuclear stress test    a. Nuc study 7/16: no scar or ischemia, EF 42 // b. Nuc study 12/17: EF 48, ?small apical ischemia,  Low Risk  . HIV (human immunodeficiency virus infection) ([place])   . HLD (hyperlipidemia)   . Hypertensive heart disease with CHF (congestive heart failure) ([place]) 10/09/2016  . OSA (obstructive sleep apnea)    Immunization History  Administered Date(s) Administered  . Influenza,inj,Quad PF,36+ Mos 10/10/2016  . Meningococcal Mcv4o 10/15/2016  . Pneumococcal Polysaccharide-23 10/15/2016   Social History   Social History Narrative  . No narrative on file    Review of Systems  Constitutional: Negative.   HENT: Negative.   Eyes: Negative.   Respiratory: Negative.  Negative for chest tightness and shortness of breath.   Cardiovascular: Positive for palpitations. Negative for leg swelling.  Endocrine: Negative.  Negative for polydipsia, polyphagia and polyuria.  Genitourinary: Negative.   Musculoskeletal: Negative.   Skin: Negative.   Allergic/Immunologic: Positive for immunocompromised state (HIV +).  Neurological: Positive for numbness (bilateral upper extremities).  Hematological: Negative.   Psychiatric/Behavioral: Negative.         Objective:   Physical Exam  HENT:  Head: Normocephalic and atraumatic.  Right Ear: External ear normal.  Left Ear: External ear normal.  Nose: Nose normal.  Mouth/Throat: Oropharynx is clear and moist.  Eyes: Conjunctivae and EOM are normal. Pupils are equal, round, and reactive to light.  Neck: Normal range of motion. Neck supple.  Cardiovascular: An irregular rhythm present.  Murmur heard. Pulmonary/Chest: Effort normal and breath sounds normal.  Abdominal: Soft. Bowel sounds are normal.       BP 125/80 (BP Location: Left Arm, Patient  Position: Sitting, Cuff Size: Large)   Pulse 75   Temp 98 F (36.7 C) (Oral)   Resp 14   Ht 5' 10"  (1.778 m)   Wt 218 lb (98.9 kg)   SpO2 99%   BMI 31.28 kg/m  Assessment & Plan:  1. Arthritis, gouty Previous uric acid was 8.7 and sedimentation rate was 60 one month ago before starting  Allopurinol. Patient says that he feels better since starting medication. The nature of gout was fully explained, including dietary relationship, acute and interval phase and treatment of both. Long term complications such as kidney stones, tophi and arthritis are discussed. Avoidance of alcohol recommended, and written literature is given along with a low purine diet. Indications for the use of allopurinol for prophylaxis treat flare-ups is also discussed. Proper use of indomethacin for acute attacks discussed, and its side effects. Call if further attacks occur, or this one does not resolve promptly. - COMPLETE METABOLIC PANEL WITH GFR - Uric Acid  2. Hypertensive heart disease with CHF (congestive heart failure) ([place]) Blood pressure is at goal on current medication regimen. He has a follow up appointment with cardiology on 02/20/2017. Patient to continue medication regimen as previously prescribed.   3. Need for Tdap vaccination - Tdap vaccine greater than or equal to 7yo IM  4. HIV disease ([place]) Follow up with Dr. Johnnye Sima as previously scheduled.    RTC: Patient will follow up in 3 months for chronic conditions   [place]  MSN, FNP-C [facility] [place], [place] [address] [phone]    Preventative maintenance:  Patient has not had a colonoscopy  Greater than 1 year since last prostate exam Last eye exam was December 2014

## 2017-01-28 ENCOUNTER — Telehealth: Payer: Self-pay | Admitting: Family Medicine

## 2017-01-28 LAB — URIC ACID: Uric Acid, Serum: 7.1 mg/dL (ref 4.0–8.0)

## 2017-01-28 NOTE — Telephone Encounter (Signed)
Reviewed labs, creatinine is elevated. Patient was referred to nephrology. He has not been able to follow up with nephrology due to financial constraints. Discussed the importance of following up with nephrology. GFR is 25, will continue Allopurinol at 200 mg per day.    [name]  MSN, FNP-C [facility] [address] [place], [place] [address] [phone]

## 2017-02-03 ENCOUNTER — Encounter: Payer: Self-pay | Admitting: Family Medicine

## 2017-02-08 NOTE — Progress Notes (Signed)
Cardiology Office Note:    Date:  02/09/2017   ID:  Albert Eaton, DOB [birth year], MRN [id number]  PCP:  [name], FNP  Cardiologist:  Dr. [name]   Electrophysiologist:  n/a ID: Dr. Johnnye Sima  Referring MD: [name], FNP   Chief Complaint  Patient presents with  . Follow-up    CHF, HTN    History of Present Illness:    Albert Eaton is a 51 y.o. male with a hx of diastolic CHF, CKD stage 3-4, HTN, HL, OSA, HIV.  He was previously followed by Cardiology in [place], [name] prior to moving to [place].  He was admitted in 09/2016 with chest pain and elevated Troponin levels in the setting of HTN urgency and CKD.  Echo showed mod diastolic dysfunction and mild MR.  Inpatient Myocardial Perfusion Imaging Study was low risk with a possible very small area of apical ischemia.  Medical Rx was felt to be appropriate, especially given his high risk for contrast induced nephropathy with baseline CKD.  He has been referred to Nephrology but has failed to keep his appointments in the past.    Last seen in 3/18.  His BP was high at his PCP office (152/96).  He complained of chest pain with taking medications and dyspnea on exertion.  I added Clonidine 0.1 bid.  I put him on Pepcid to cover GERD as a cause for his chest pain.  He returns for follow up.  He is here alone.  He had one episode of PND and chest pain a few days ago.  He took extra Lasix with improvement.  He denies exertional chest pain.  He denies syncope, cough, wheezing, edema.  He has had some back pain. He has not set up an appointment with the Nephrologist.  His PCP has tried to help him with getting assistance here in [place] and at [place].  He has failed to follow up.     Prior CV studies:   The following studies were reviewed today:  Myocardial Perfusion Imaging Study 10/10/16 EF 48, possible very small area of apical ischemia, low risk  Echo 10/09/16 Moderate LVH, grade 2 diastolic  dysfunction, mild MR, moderate LAE  Echo 7/16 [facility] in [place], [name]) Mild AI, mild LAE, mild concentric LVH, EF 55, normal wall motion, mild to moderate MR, mild PI, RVSP 55  Myocardial Perfusion Imaging Study 7/16 [facility] in [place], [name]) Impression: 1.Normal exercise nuclear stress test revealing no evidence of ischemia or scar. 2.Abnormal gated wall motion study revealing moderate global hypokinesis. 3.Abnormal left ventricular ejection fraction of 42%.  Past Medical History:  Diagnosis Date  . Chronic diastolic CHF (congestive heart failure) ([place]) 01/06/2017   Echo 7/16 [facility] in [place], [name]) Mild AI, mild LAE, mild concentric LVH, EF 55, normal wall motion, mild to moderate MR, mild PI, RVSP 55 // Echo 10/09/16 ([facility]):  Moderate LVH, grade 2 diastolic dysfunction, mild MR, moderate LAE   . CKD (chronic kidney disease)   . Gout   . History of nuclear stress test    a. Nuc study 7/16: no scar or ischemia, EF 42 // b. Nuc study 12/17: EF 48, ?small apical ischemia, Low Risk  . HIV (human immunodeficiency virus infection) ([place])   . HLD (hyperlipidemia)   . Hypertensive heart disease with CHF (congestive heart failure) ([place]) 10/09/2016  . OSA (obstructive sleep apnea)     Past Surgical History:  Procedure Laterality Date  . CHOLECYSTECTOMY    .  HERNIA REPAIR     As baby    Current Medications: Current Meds  Medication Sig  . abacavir (ZIAGEN) 300 MG tablet Take 1 tablet (300 mg total) by mouth daily.  Marland Kitchen allopurinol (ZYLOPRIM) 100 MG tablet Take 1 tablet (100 mg total) by mouth 2 (two) times daily.  Marland Kitchen amLODipine (NORVASC) 10 MG tablet Take 1 tablet (10 mg total) by mouth daily.  Marland Kitchen atorvastatin (LIPITOR) 40 MG tablet Take 1 tablet (40 mg total) by mouth daily.  . carvedilol (COREG) 25 MG tablet Take 1 tablet (25 mg total) by mouth 2 (two) times daily with a meal.  . cloNIDine (CATAPRES) 0.1 MG tablet Take 1 tablet (0.1 mg total) by mouth 2  (two) times daily.  . colchicine 0.6 MG tablet Take 1 tablet (0.6 mg total) by mouth daily.  . famotidine (PEPCID) 20 MG tablet Take 1 tablet (20 mg total) by mouth 2 (two) times daily.  . furosemide (LASIX) 20 MG tablet Take 3 tablets (60 mg total) by mouth 2 (two) times daily.  Marland Kitchen gabapentin (NEURONTIN) 300 MG capsule Take 1 capsule (300 mg total) by mouth 3 (three) times daily.  . hydrALAZINE (APRESOLINE) 100 MG tablet Take 1 tablet (100 mg total) by mouth 3 (three) times daily.  Marland Kitchen lamiVUDine (EPIVIR) 150 MG tablet Take 1 tablet (150 mg total) by mouth daily.  . potassium chloride (K-DUR) 10 MEQ tablet Take 1 tablet (10 mEq total) by mouth daily.  . predniSONE (DELTASONE) 10 MG tablet Take 1 tablet (10 mg total) by mouth daily with breakfast. 60 mg day 1, 50 mg day 2, 40 mg day 3, 30 mg day 4, 20 mg day 5, and 10 mg day 6  . raltegravir (ISENTRESS) 400 MG tablet Take 1 tablet (400 mg total) by mouth 2 (two) times daily.  Marland Kitchen spironolactone (ALDACTONE) 25 MG tablet Take 0.5 tablets (12.5 mg total) by mouth daily.  Marland Kitchen zinc gluconate 50 MG tablet Take 100 mg by mouth daily.  . [DISCONTINUED] isosorbide mononitrate (IMDUR) 30 MG 24 hr tablet Take 1 tablet (30 mg total) by mouth daily.     Allergies:   Patient has no known allergies.   Social History   Social History  . Marital status: Unknown    Spouse name: N/A  . Number of children: N/A  . Years of education: N/A   Social History Main Topics  . Smoking status: Former Smoker    Quit date: 2000  . Smokeless tobacco: Former Systems developer    Quit date: 09/10/1999  . Alcohol use 8.4 oz/week    14 Shots of liquor per week     Comment: Light use  . Drug use: No  . Sexual activity: No     Comment: declined condoms   Other Topics Concern  . None   Social History Narrative  . None     Family History  Problem Relation Age of Onset  . Heart failure Mother   . Heart attack Maternal Grandmother   . Hypertension Sister   . Hypertension Brother       ROS:   Please see the history of present illness.    Review of Systems  Constitution: Positive for malaise/fatigue and weight gain.  Respiratory: Positive for shortness of breath.   Musculoskeletal: Positive for back pain.   All other systems reviewed and are negative.   EKGs/Labs/Other Test Reviewed:    EKG:  EKG is  ordered today.  The ekg ordered today demonstrates NSR, HR  62, normal axis, QTc 483 ms, no significant changes.   Recent Labs: 10/09/2016: B Natriuretic Peptide 454.6 10/15/2016: Hemoglobin 13.4; Platelets 257 01/06/2017: Magnesium 2.2; NT-Pro BNP 1,501 01/27/2017: ALT 14; BUN 32; Creat 3.16; Potassium 3.4; Sodium 141   Recent Lipid Panel    Component Value Date/Time   CHOL 136 10/15/2016 1011   TRIG 94 10/15/2016 1011   HDL 49 10/15/2016 1011   CHOLHDL 2.8 10/15/2016 1011   VLDL 19 10/15/2016 1011   LDLCALC 68 10/15/2016 1011     Physical Exam:    VS:  BP 130/90   Pulse 62   Ht 5' 10"  (1.778 m)   Wt 215 lb 6.4 oz (97.7 kg)   BMI 30.91 kg/m     Wt Readings from Last 3 Encounters:  02/09/17 215 lb 6.4 oz (97.7 kg)  01/27/17 218 lb (98.9 kg)  01/06/17 207 lb 6.4 oz (94.1 kg)     Physical Exam  Constitutional: He is oriented to person, place, and time. He appears well-developed and well-nourished. No distress.  HENT:  Head: Normocephalic and atraumatic.  Eyes: No scleral icterus.  Neck: Normal range of motion. No JVD present.  Cardiovascular: Normal rate, regular rhythm, S1 normal and S2 normal.   No murmur heard. Pulmonary/Chest: Effort normal and breath sounds normal. He has no wheezes. He has no rhonchi. He has no rales.  Abdominal: Soft. There is no tenderness.  Musculoskeletal: He exhibits no edema.  Neurological: He is alert and oriented to person, place, and time.  Skin: Skin is warm and dry.  Psychiatric: He has a normal mood and affect.    ASSESSMENT:    1. Hypertensive heart disease with chronic diastolic congestive heart  failure ([place])   2. CKD stage 4 secondary to hypertension ([place])   3. OSA (obstructive sleep apnea)   4. Hypokalemia    PLAN:    In order of problems listed above:  1. Hypertensive heart disease with chronic diastolic congestive heart failure ([place]) -  His BP is much better controlled.  His volume is currently stable.  Recent labs with s/w worse Creatinine.  His potassium remains low.    -  Continue amlodipine, carvedilol, clonidine, hydralazine, spironolactone  -  Increase isosorbide to 60 mg daily  -  BMET today  2. CKD stage 4 secondary to hypertension ([place]) -  Repeat BMET today. We discussed the importance of seeing a nephrologist. He will follow-up on this with his PCP.  3. OSA (obstructive sleep apnea) - Arrange referral to Dr. Radford Pax once he has insurance.  4. Hypokalemia -  Repeat BMET today. Consider adjusting potassium supplementation versus increasing dose of spironolactone.   Dispo:  Return in about 3 months (around 05/11/2017) for Routine Follow Up, w/ Dr. [name].   Medication Adjustments/Labs and Tests Ordered: Current medicines are reviewed at length with the patient today.  Concerns regarding medicines are outlined above.  Medication changes, Labs and Tests ordered today are outlined in the Patient Instructions noted below. Patient Instructions  Medication Instructions:  Increase Imdur (Isosorbide) to 60 mg Once daily   Labwork: Today - BMET  Testing/Procedures: None   Follow-Up: Dr. [name] in 3 months.   Any Other Special Instructions Will Be Listed Below (If Applicable). Please call the case worker [facility] and stay in touch with [facility] M, FNP's office to get in to see the kidney doctor (Nephrologist) - it is very important that you see them for your kidneys.  If you need a refill on your cardiac medications before your next appointment, please call your pharmacy.   Signed, Richardson Dopp, PA-C  02/09/2017  10:04 AM    [facility] [address], [place], [place]  [address] Phone: [phone]; Fax: [phone]

## 2017-02-09 ENCOUNTER — Encounter: Payer: Self-pay | Admitting: Physician Assistant

## 2017-02-09 ENCOUNTER — Ambulatory Visit (INDEPENDENT_AMBULATORY_CARE_PROVIDER_SITE_OTHER): Payer: Medicaid Other | Admitting: Physician Assistant

## 2017-02-09 ENCOUNTER — Telehealth: Payer: Self-pay | Admitting: *Deleted

## 2017-02-09 VITALS — BP 130/90 | HR 62 | Ht 70.0 in | Wt 215.4 lb

## 2017-02-09 DIAGNOSIS — E876 Hypokalemia: Secondary | ICD-10-CM

## 2017-02-09 DIAGNOSIS — N184 Chronic kidney disease, stage 4 (severe): Secondary | ICD-10-CM

## 2017-02-09 DIAGNOSIS — I5032 Chronic diastolic (congestive) heart failure: Secondary | ICD-10-CM

## 2017-02-09 DIAGNOSIS — I110 Hypertensive heart disease with heart failure: Secondary | ICD-10-CM

## 2017-02-09 DIAGNOSIS — I129 Hypertensive chronic kidney disease with stage 1 through stage 4 chronic kidney disease, or unspecified chronic kidney disease: Secondary | ICD-10-CM

## 2017-02-09 DIAGNOSIS — G4733 Obstructive sleep apnea (adult) (pediatric): Secondary | ICD-10-CM | POA: Diagnosis not present

## 2017-02-09 LAB — BASIC METABOLIC PANEL
BUN/Creatinine Ratio: 10 (ref 9–20)
BUN: 32 mg/dL — AB (ref 6–24)
CALCIUM: 10 mg/dL (ref 8.7–10.2)
CHLORIDE: 96 mmol/L (ref 96–106)
CO2: 24 mmol/L (ref 18–29)
CREATININE: 3.35 mg/dL — AB (ref 0.76–1.27)
GFR, EST AFRICAN AMERICAN: 23 mL/min/{1.73_m2} — AB (ref >59)
GFR, EST NON AFRICAN AMERICAN: 20 mL/min/{1.73_m2} — AB (ref >59)
Glucose: 103 mg/dL — ABNORMAL HIGH (ref 65–99)
Potassium: 3.3 mmol/L — ABNORMAL LOW (ref 3.5–5.2)
Sodium: 140 mmol/L (ref 134–144)

## 2017-02-09 MED ORDER — ISOSORBIDE MONONITRATE ER 60 MG PO TB24: 60.0000 mg | ORAL_TABLET | Freq: Every day | ORAL | 3 refills | Status: DC

## 2017-02-09 NOTE — Patient Instructions (Signed)
Medication Instructions:  Increase Imdur (Isosorbide) to 60 mg Once daily   Labwork: Today - BMET  Testing/Procedures: None   Follow-Up: Dr. [name] in 3 months.   Any Other Special Instructions Will Be Listed Below (If Applicable). Please call the case worker [facility] and Wellness and stay in touch with [facility] M, FNP's office to get in to see the kidney doctor (Nephrologist) - it is very important that you see them for your kidneys.  If you need a refill on your cardiac medications before your next appointment, please call your pharmacy.

## 2017-02-09 NOTE — Telephone Encounter (Signed)
-----   Message from Liliane Shi, [place] sent at 02/09/2017  5:35 PM EDT ----- Please call the patient Kidney function is worse. The potassium remains low. Decrease Lasix to 40 mg QD Repeat BMET 1 week. Richardson Dopp, PA-C    02/09/2017 5:34 PM

## 2017-02-09 NOTE — Telephone Encounter (Signed)
Lmtcb to go over lab results and recommendations. Home # is not valid, [phone].

## 2017-02-10 MED ORDER — FUROSEMIDE 20 MG PO TABS: 40.0000 mg | ORAL_TABLET | Freq: Every day | ORAL | Status: DC

## 2017-02-10 NOTE — Telephone Encounter (Signed)
-----   Message from Liliane Shi, [place] sent at 02/09/2017  5:35 PM EDT ----- Please call the patient Kidney function is worse. The potassium remains low. Decrease Lasix to 40 mg QD Repeat BMET 1 week. Richardson Dopp, PA-C    02/09/2017 5:34 PM

## 2017-02-10 NOTE — Telephone Encounter (Signed)
Pt has been notified of lab results and findings by phone with verbal understanding. Pt is agreeable to plan of care to decrease lasix to 40 mg daily with repeat  bmet 02/19/17. Pt thanked me for my call today.

## 2017-02-12 ENCOUNTER — Other Ambulatory Visit: Payer: Self-pay | Admitting: *Deleted

## 2017-02-12 DIAGNOSIS — I129 Hypertensive chronic kidney disease with stage 1 through stage 4 chronic kidney disease, or unspecified chronic kidney disease: Secondary | ICD-10-CM

## 2017-02-12 DIAGNOSIS — I5032 Chronic diastolic (congestive) heart failure: Secondary | ICD-10-CM

## 2017-02-12 DIAGNOSIS — N184 Chronic kidney disease, stage 4 (severe): Secondary | ICD-10-CM

## 2017-02-12 LAB — BASIC METABOLIC PANEL
BUN/Creatinine Ratio: 10 (ref 9–20)
BUN: 30 mg/dL — ABNORMAL HIGH (ref 6–24)
CALCIUM: 9.9 mg/dL (ref 8.7–10.2)
CHLORIDE: 96 mmol/L (ref 96–106)
CO2: 24 mmol/L (ref 18–29)
Creatinine, Ser: 3.03 mg/dL — ABNORMAL HIGH (ref 0.76–1.27)
GFR calc Af Amer: 26 mL/min/{1.73_m2} — ABNORMAL LOW (ref >59)
GFR calc non Af Amer: 23 mL/min/{1.73_m2} — ABNORMAL LOW (ref >59)
GLUCOSE: 93 mg/dL (ref 65–99)
POTASSIUM: 3.4 mmol/L — AB (ref 3.5–5.2)
SODIUM: 143 mmol/L (ref 134–144)

## 2017-02-13 ENCOUNTER — Telehealth: Payer: Self-pay | Admitting: *Deleted

## 2017-02-13 MED ORDER — POTASSIUM CHLORIDE ER 20 MEQ PO TBCR: 20.0000 meq | EXTENDED_RELEASE_TABLET | Freq: Every day | ORAL | 3 refills | Status: DC

## 2017-02-13 NOTE — Telephone Encounter (Signed)
-----   Message from Liliane Shi, [place] sent at 02/12/2017  5:39 PM EDT ----- Please call the patient Kidney function is stable potassium is low Increase K+ to 20 mEq QD BMET 1 week Richardson Dopp, PA-C    02/12/2017 5:39 PM

## 2017-02-13 NOTE — Telephone Encounter (Signed)
Pt notified of lab results and findings by phone with verbal understanding. Pt agreeable to increase K+ to 20 meq daily, keep appt 02/19/17 for repeat bmet. Pt verbalized understanding to plan of care. Pt thanked me for my call.

## 2017-02-19 ENCOUNTER — Other Ambulatory Visit: Payer: Self-pay | Admitting: *Deleted

## 2017-02-19 ENCOUNTER — Other Ambulatory Visit: Payer: Self-pay

## 2017-02-19 DIAGNOSIS — I110 Hypertensive heart disease with heart failure: Secondary | ICD-10-CM

## 2017-02-19 DIAGNOSIS — I5032 Chronic diastolic (congestive) heart failure: Secondary | ICD-10-CM

## 2017-02-19 NOTE — Addendum Note (Signed)
Addended by: Eulis Foster on: 02/19/2017 12:05 PM   Modules accepted: Orders

## 2017-02-20 ENCOUNTER — Telehealth: Payer: Self-pay | Admitting: *Deleted

## 2017-02-20 DIAGNOSIS — I129 Hypertensive chronic kidney disease with stage 1 through stage 4 chronic kidney disease, or unspecified chronic kidney disease: Secondary | ICD-10-CM

## 2017-02-20 DIAGNOSIS — I5032 Chronic diastolic (congestive) heart failure: Secondary | ICD-10-CM

## 2017-02-20 DIAGNOSIS — N184 Chronic kidney disease, stage 4 (severe): Secondary | ICD-10-CM

## 2017-02-20 LAB — BASIC METABOLIC PANEL
BUN/Creatinine Ratio: 9 (ref 9–20)
BUN: 28 mg/dL — ABNORMAL HIGH (ref 6–24)
CHLORIDE: 98 mmol/L (ref 96–106)
CO2: 26 mmol/L (ref 18–29)
Calcium: 9.7 mg/dL (ref 8.7–10.2)
Creatinine, Ser: 3.28 mg/dL — ABNORMAL HIGH (ref 0.76–1.27)
GFR, EST AFRICAN AMERICAN: 24 mL/min/{1.73_m2} — AB (ref >59)
GFR, EST NON AFRICAN AMERICAN: 21 mL/min/{1.73_m2} — AB (ref >59)
Glucose: 92 mg/dL (ref 65–99)
POTASSIUM: 3.3 mmol/L — AB (ref 3.5–5.2)
SODIUM: 141 mmol/L (ref 134–144)

## 2017-02-20 NOTE — Telephone Encounter (Signed)
Lmtcb to go over lab results and recommendations to increase K+ to 30 meq daily.

## 2017-02-20 NOTE — Telephone Encounter (Signed)
-----   Message from Liliane Shi, [place] sent at 02/20/2017  1:42 PM EDT ----- Please call the patient Kidney function is fairly stable. The potassium remains low. Increase K+ to 30 mEq QD BMET 1 week. Richardson Dopp, PA-C    02/20/2017 1:41 PM

## 2017-02-23 MED ORDER — POTASSIUM CHLORIDE CRYS ER 20 MEQ PO TBCR: 30.0000 meq | EXTENDED_RELEASE_TABLET | Freq: Every day | ORAL | 3 refills | Status: DC

## 2017-02-23 NOTE — Telephone Encounter (Signed)
-----   Message from Liliane Shi, [place] sent at 02/20/2017  1:42 PM EDT ----- Please call the patient Kidney function is fairly stable. The potassium remains low. Increase K+ to 30 mEq QD BMET 1 week. Richardson Dopp, PA-C    02/20/2017 1:41 PM

## 2017-02-23 NOTE — Telephone Encounter (Signed)
Pt notified of lab results and is agreeable to increase K+ to 30 meq daily, with bmet to be done in 1 week. Pt would like to get lab work done on 03/05/17. Pt thanked me for my call as well as verbalized understanding to plan of care.

## 2017-03-05 ENCOUNTER — Other Ambulatory Visit: Payer: Self-pay | Admitting: *Deleted

## 2017-03-05 DIAGNOSIS — I129 Hypertensive chronic kidney disease with stage 1 through stage 4 chronic kidney disease, or unspecified chronic kidney disease: Secondary | ICD-10-CM

## 2017-03-05 DIAGNOSIS — I5032 Chronic diastolic (congestive) heart failure: Secondary | ICD-10-CM

## 2017-03-05 DIAGNOSIS — N184 Chronic kidney disease, stage 4 (severe): Secondary | ICD-10-CM

## 2017-03-05 LAB — BASIC METABOLIC PANEL
BUN/Creatinine Ratio: 10 (ref 9–20)
BUN: 31 mg/dL — ABNORMAL HIGH (ref 6–24)
CALCIUM: 9.7 mg/dL (ref 8.7–10.2)
CHLORIDE: 97 mmol/L (ref 96–106)
CO2: 21 mmol/L (ref 18–29)
Creatinine, Ser: 2.96 mg/dL — ABNORMAL HIGH (ref 0.76–1.27)
GFR calc non Af Amer: 24 mL/min/{1.73_m2} — ABNORMAL LOW (ref >59)
GFR, EST AFRICAN AMERICAN: 27 mL/min/{1.73_m2} — AB (ref >59)
Glucose: 108 mg/dL — ABNORMAL HIGH (ref 65–99)
POTASSIUM: 3.3 mmol/L — AB (ref 3.5–5.2)
SODIUM: 139 mmol/L (ref 134–144)

## 2017-03-10 ENCOUNTER — Telehealth: Payer: Self-pay | Admitting: Physician Assistant

## 2017-03-10 DIAGNOSIS — I129 Hypertensive chronic kidney disease with stage 1 through stage 4 chronic kidney disease, or unspecified chronic kidney disease: Secondary | ICD-10-CM

## 2017-03-10 DIAGNOSIS — N184 Chronic kidney disease, stage 4 (severe): Secondary | ICD-10-CM

## 2017-03-10 DIAGNOSIS — I5032 Chronic diastolic (congestive) heart failure: Secondary | ICD-10-CM

## 2017-03-10 NOTE — Telephone Encounter (Signed)
New message    Pt is calling about lab work results.

## 2017-03-10 NOTE — Telephone Encounter (Signed)
I tried to call pt back at both home and cell # though no answer on either and no VM came on. Was calling to go over lab results.

## 2017-03-12 ENCOUNTER — Encounter: Payer: Self-pay | Admitting: Family Medicine

## 2017-03-16 MED ORDER — POTASSIUM CHLORIDE CRYS ER 20 MEQ PO TBCR: 40.0000 meq | EXTENDED_RELEASE_TABLET | Freq: Every day | ORAL | 3 refills | Status: DC

## 2017-03-16 NOTE — Telephone Encounter (Signed)
Pt notified of lab results and findings by phone. Pt does states he has been taking K+ 30 meq daily. I have advised pt of recommendations per Brynda Rim. PA to increase K+ to 40 meq daily with repeat bmet to be done in 2 weeks. Pt agreeable to plan of care and will have repeat bmet 03/30/17. Pt thanked me for my call today.

## 2017-03-18 ENCOUNTER — Emergency Department (HOSPITAL_COMMUNITY): Payer: Medicaid Other

## 2017-03-18 ENCOUNTER — Other Ambulatory Visit: Payer: Self-pay | Admitting: Family Medicine

## 2017-03-18 ENCOUNTER — Emergency Department (HOSPITAL_COMMUNITY)
Admission: EM | Admit: 2017-03-18 | Discharge: 2017-03-18 | Disposition: A | Discharge status: Home / Self Care | Payer: Medicaid Other | Attending: Emergency Medicine | Admitting: Emergency Medicine

## 2017-03-18 ENCOUNTER — Encounter (HOSPITAL_COMMUNITY): Payer: Self-pay | Admitting: *Deleted

## 2017-03-18 DIAGNOSIS — Z79899 Other long term (current) drug therapy: Secondary | ICD-10-CM | POA: Diagnosis not present

## 2017-03-18 DIAGNOSIS — M109 Gout, unspecified: Secondary | ICD-10-CM

## 2017-03-18 DIAGNOSIS — E876 Hypokalemia: Secondary | ICD-10-CM | POA: Diagnosis not present

## 2017-03-18 DIAGNOSIS — I130 Hypertensive heart and chronic kidney disease with heart failure and stage 1 through stage 4 chronic kidney disease, or unspecified chronic kidney disease: Secondary | ICD-10-CM | POA: Diagnosis not present

## 2017-03-18 DIAGNOSIS — N184 Chronic kidney disease, stage 4 (severe): Secondary | ICD-10-CM | POA: Diagnosis not present

## 2017-03-18 DIAGNOSIS — K529 Noninfective gastroenteritis and colitis, unspecified: Secondary | ICD-10-CM | POA: Diagnosis not present

## 2017-03-18 DIAGNOSIS — I5032 Chronic diastolic (congestive) heart failure: Secondary | ICD-10-CM | POA: Insufficient documentation

## 2017-03-18 DIAGNOSIS — R1084 Generalized abdominal pain: Secondary | ICD-10-CM | POA: Diagnosis present

## 2017-03-18 DIAGNOSIS — R109 Unspecified abdominal pain: Secondary | ICD-10-CM

## 2017-03-18 DIAGNOSIS — Z87891 Personal history of nicotine dependence: Secondary | ICD-10-CM | POA: Diagnosis not present

## 2017-03-18 LAB — CBC
HEMATOCRIT: 38.1 % — AB (ref 39.0–52.0)
Hemoglobin: 13.9 g/dL (ref 13.0–17.0)
MCH: 31.5 pg (ref 26.0–34.0)
MCHC: 36.5 g/dL — AB (ref 30.0–36.0)
MCV: 86.4 fL (ref 78.0–100.0)
PLATELETS: 271 10*3/uL (ref 150–400)
RBC: 4.41 MIL/uL (ref 4.22–5.81)
RDW: 13.1 % (ref 11.5–15.5)
WBC: 7.5 10*3/uL (ref 4.0–10.5)

## 2017-03-18 LAB — COMPREHENSIVE METABOLIC PANEL
ALBUMIN: 3.9 g/dL (ref 3.5–5.0)
ALT: 15 U/L — ABNORMAL LOW (ref 17–63)
AST: 20 U/L (ref 15–41)
Alkaline Phosphatase: 72 U/L (ref 38–126)
Anion gap: 13 (ref 5–15)
BUN: 29 mg/dL — AB (ref 6–20)
CO2: 24 mmol/L (ref 22–32)
Calcium: 10.1 mg/dL (ref 8.9–10.3)
Chloride: 99 mmol/L — ABNORMAL LOW (ref 101–111)
Creatinine, Ser: 3.51 mg/dL — ABNORMAL HIGH (ref 0.61–1.24)
GFR calc Af Amer: 22 mL/min — ABNORMAL LOW (ref >60)
GFR calc non Af Amer: 19 mL/min — ABNORMAL LOW (ref >60)
GLUCOSE: 124 mg/dL — AB (ref 65–99)
POTASSIUM: 2.6 mmol/L — AB (ref 3.5–5.1)
Sodium: 136 mmol/L (ref 135–145)
Total Bilirubin: 0.9 mg/dL (ref 0.3–1.2)
Total Protein: 7.8 g/dL (ref 6.5–8.1)

## 2017-03-18 LAB — URINALYSIS, ROUTINE W REFLEX MICROSCOPIC
BACTERIA UA: NONE SEEN
Bilirubin Urine: NEGATIVE
GLUCOSE, UA: 50 mg/dL — AB
Ketones, ur: NEGATIVE mg/dL
Leukocytes, UA: NEGATIVE
NITRITE: NEGATIVE
Protein, ur: >=300 mg/dL — AB
SPECIFIC GRAVITY, URINE: 1.023 (ref 1.005–1.030)
pH: 5 (ref 5.0–8.0)

## 2017-03-18 LAB — LIPASE, BLOOD: LIPASE: 42 U/L (ref 11–51)

## 2017-03-18 IMAGING — CT CT ABD-PELV W/O CM
2 of 4 series · 16 of 46 positions shown, 18 images · non-contrast
Comparison: None.

CLINICAL DATA: 50-year-old with abdominal pain and vomiting.

EXAM:
CT ABDOMEN AND PELVIS WITHOUT CONTRAST
TECHNIQUE: Multidetector CT imaging of the abdomen and pelvis was performed
following the standard protocol without IV contrast.

[Series 3: abd/ pelvis 5.0 i30f 2 · axial · 0.72mm/px · z∈[+749,+1209]mm · 13 of 100 slices shown, 15 images]
[im 4/100  soft-tissue]
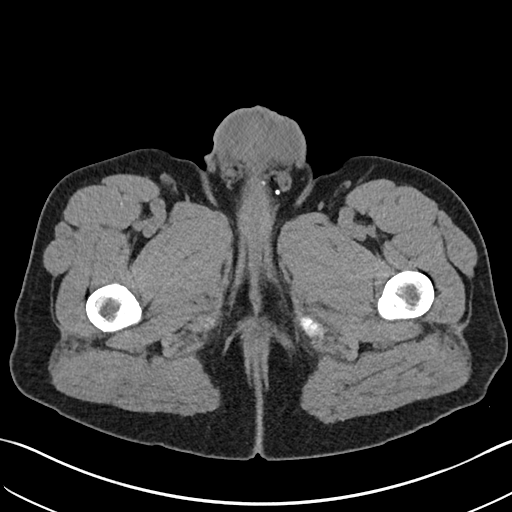
[im 4/100  bone]
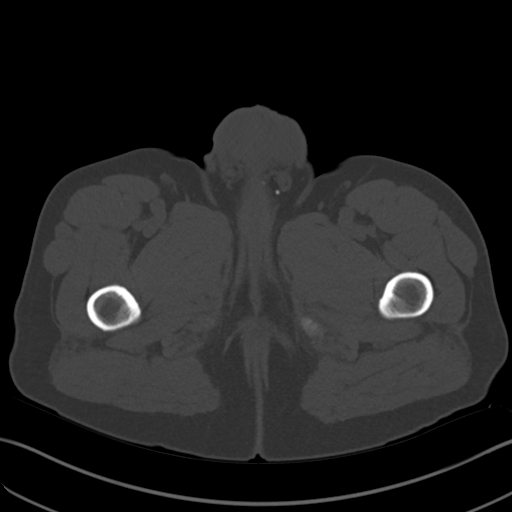
[im 12/100  soft-tissue]
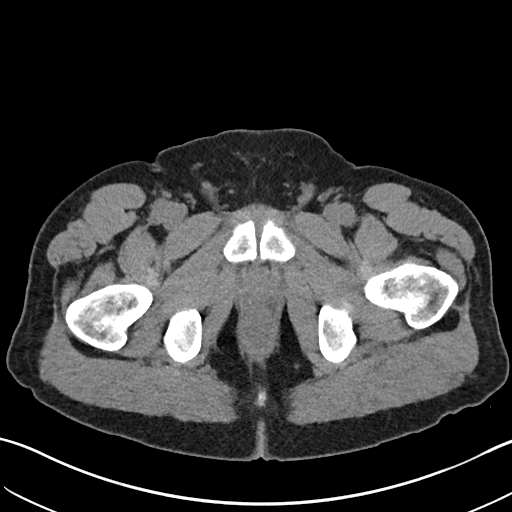
[im 20/100  soft-tissue]
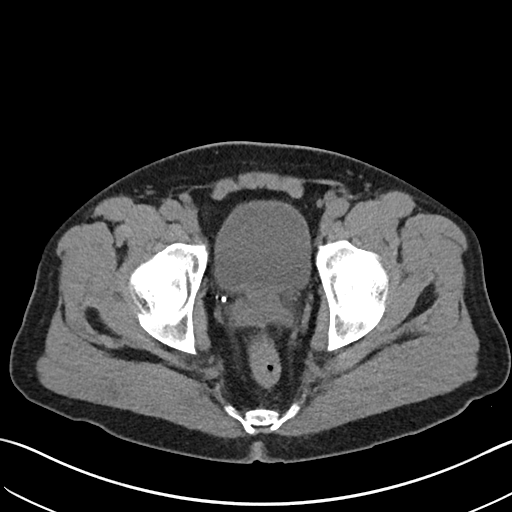
[im 28/100  soft-tissue]
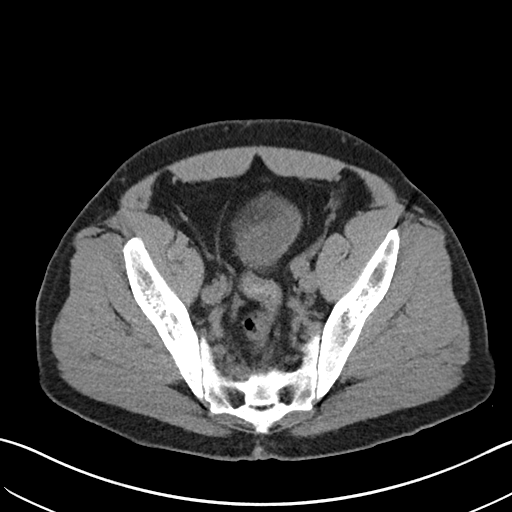
[im 36/100  soft-tissue]
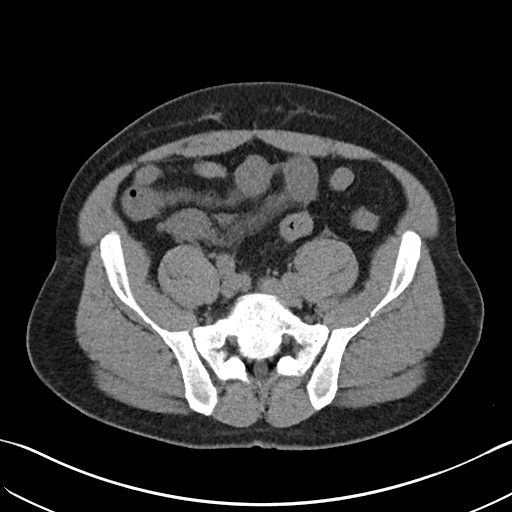
[im 44/100  soft-tissue]
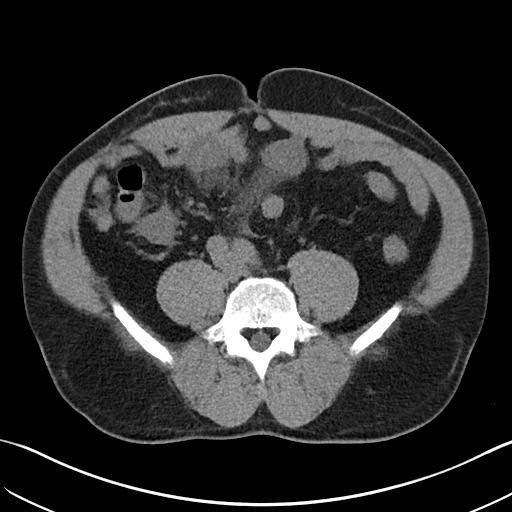
[im 52/100  soft-tissue]
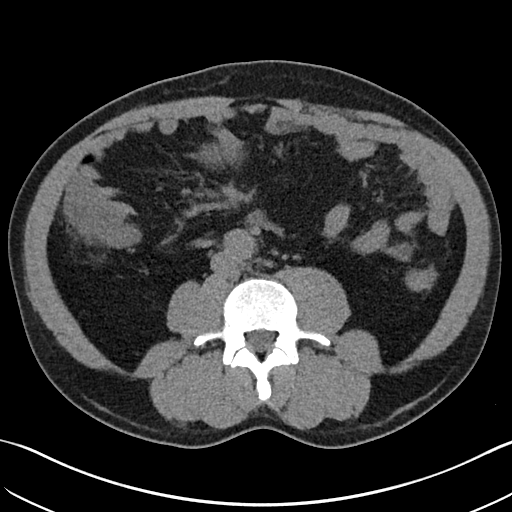
[im 56/100  soft-tissue]
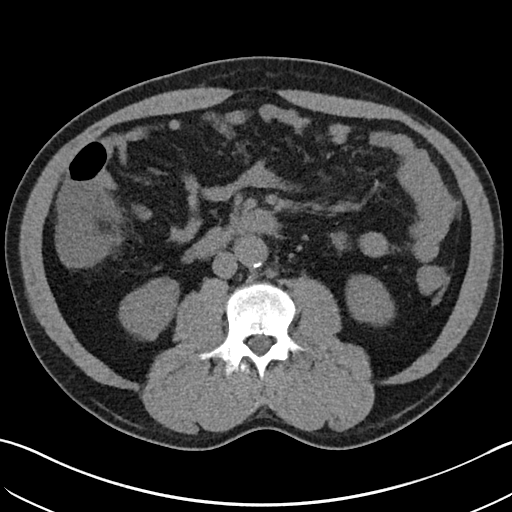
[im 64/100  soft-tissue]
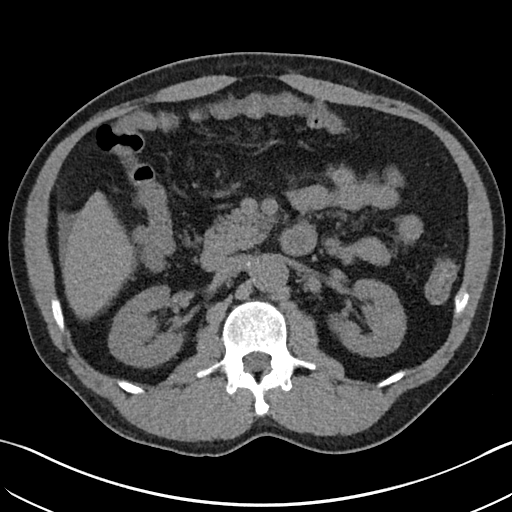
[im 64/100  bone]
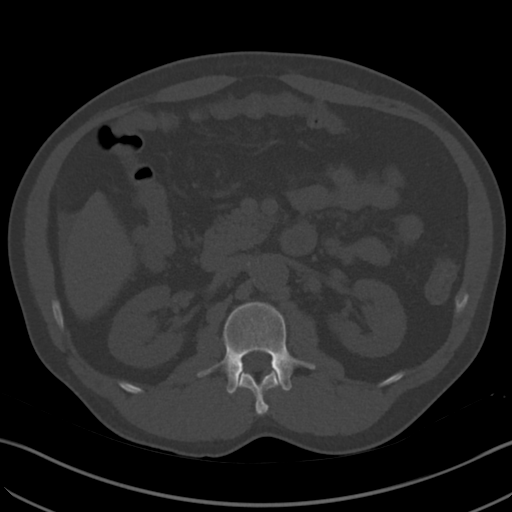
[im 72/100  soft-tissue]
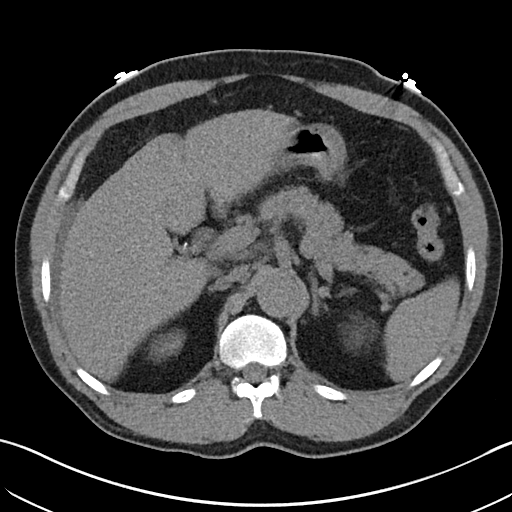
[im 80/100  soft-tissue]
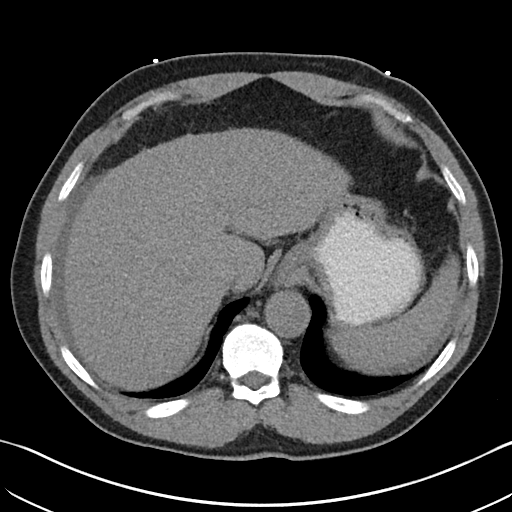
[im 88/100  soft-tissue]
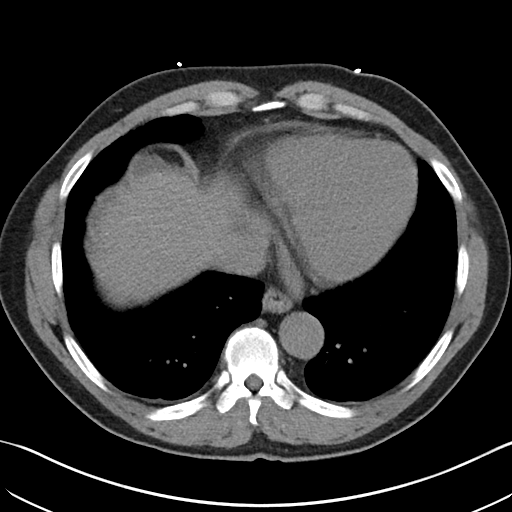
[im 96/100  soft-tissue]
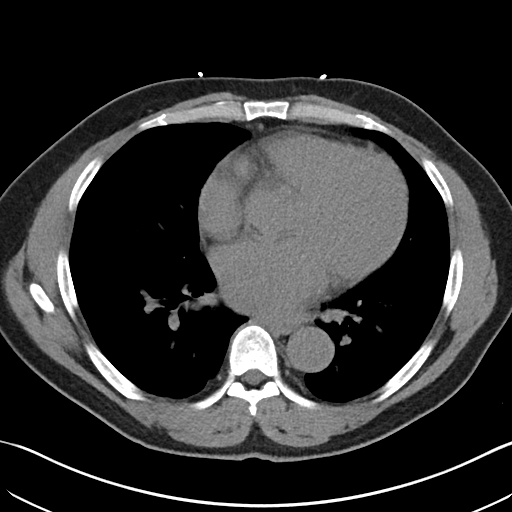

[Series 5: cor st · coronal · 0.79mm/px · 3 of 103 slices shown]
[im 35/103  soft-tissue]
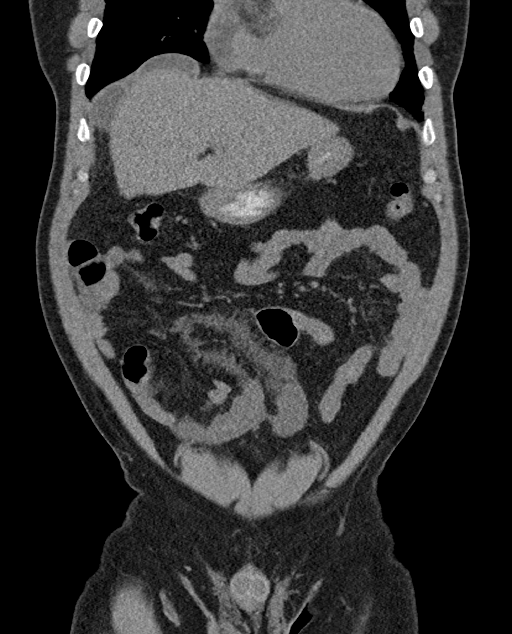
[im 46/103  soft-tissue]
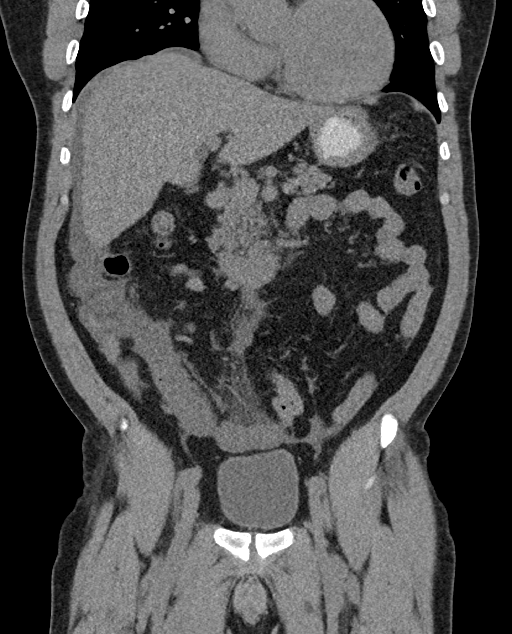
[im 57/103  soft-tissue]
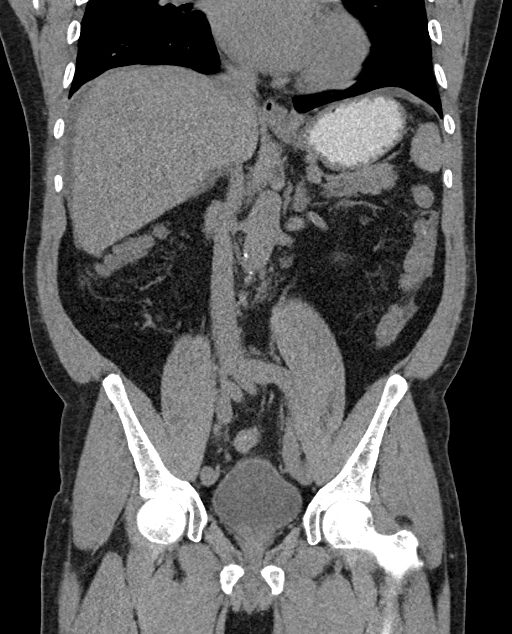

[16 of 46 positions shown; findings below may reference images not displayed]

FINDINGS: Lower chest: Lung bases are clear.

Hepatobiliary: Small amount of perihepatic ascites. Gallbladder has
been removed. Question minimal nodularity along the anterior aspect
of the liver.

Pancreas: Normal appearance of the pancreas without inflammation or
duct dilatation.

Spleen: Normal appearance of spleen without enlargement.

Adrenals/Urinary Tract: Normal adrenal glands. Possible cyst in the
posterior left kidney. Negative for kidney stones or hydronephrosis.
Urinary bladder is within normal limits. Indeterminate hyperdense
exophytic lesion in the right kidney lower pole measuring up to 9
mm.

Stomach/Bowel: Normal appearance of the stomach and duodenum. There
are mildly dilated loops of small bowel in the mid and lower right
abdomen. These bowel loops also demonstrate wall thickening
measuring roughly 9 mm. There is mesenteric edema and fluid
associated with these abnormal loops of small bowel. There is no
significant dilatation of the proximal small bowel loops. No gross
abnormality to the terminal ileum although bowel inflammation
appears to be involving the distal ileum. There appears to be a
normal appendix. Normal appearance of the colon.

Vascular/Lymphatic: Wall calcifications involving the aorta and
iliac arteries without aortic aneurysm. No significant lymph node
enlargement in the abdomen or pelvis.

Reproductive: Prostate is unremarkable.

Other: Small amount of free fluid in the pelvis. Mesenteric edema
and fluid around the abnormal loops of small bowel in the lower
right abdomen. Trace fluid in the left paracolic gutter. Trace fluid
around spleen. Negative for free air.

Musculoskeletal: No acute bone abnormality.
IMPRESSION: Abnormal loops of the distal small bowel with wall thickening and
adjacent mesenteric edema and fluid. Findings are suggestive for
small bowel inflammation and enteritis.

No evidence for a bowel obstruction.

Small amount of free fluid in the abdomen and pelvis.

Indeterminate small renal lesions. Consider further characterization
with a non emergent renal ultrasound.

## 2017-03-18 MED ORDER — METRONIDAZOLE 500 MG PO TABS: 500.0000 mg | ORAL_TABLET | Freq: Two times a day (BID) | ORAL | 0 refills | Status: DC

## 2017-03-18 MED ORDER — HYDROMORPHONE HCL 1 MG/ML IJ SOLN
1.0000 mg | Freq: Once | INTRAMUSCULAR | Status: AC
  Administered 2017-03-18: 1 mg via INTRAVENOUS
  Filled 2017-03-18: qty 1

## 2017-03-18 MED ORDER — OXYCODONE-ACETAMINOPHEN 5-325 MG PO TABS: 2.0000 | ORAL_TABLET | ORAL | 0 refills | Status: DC | PRN

## 2017-03-18 MED ORDER — METRONIDAZOLE 500 MG PO TABS
500.0000 mg | ORAL_TABLET | Freq: Once | ORAL | Status: DC
Start: 2017-03-18 — End: 2017-03-18

## 2017-03-18 MED ORDER — ONDANSETRON 4 MG PO TBDP: 4.0000 mg | ORAL_TABLET | Freq: Three times a day (TID) | ORAL | 0 refills | Status: DC | PRN

## 2017-03-18 MED ORDER — POTASSIUM CHLORIDE CRYS ER 20 MEQ PO TBCR
40.0000 meq | EXTENDED_RELEASE_TABLET | Freq: Once | ORAL | Status: AC
  Administered 2017-03-18: 40 meq via ORAL
  Filled 2017-03-18: qty 2

## 2017-03-18 MED ORDER — AMOXICILLIN-POT CLAVULANATE 875-125 MG PO TABS: 1.0000 | ORAL_TABLET | Freq: Two times a day (BID) | ORAL | 0 refills | Status: DC

## 2017-03-18 MED ORDER — CIPROFLOXACIN HCL 500 MG PO TABS: 500.0000 mg | ORAL_TABLET | Freq: Two times a day (BID) | ORAL | 0 refills | Status: DC

## 2017-03-18 MED ORDER — ONDANSETRON HCL 4 MG/2ML IJ SOLN
4.0000 mg | Freq: Once | INTRAMUSCULAR | Status: AC
  Administered 2017-03-18: 4 mg via INTRAVENOUS
  Filled 2017-03-18: qty 2

## 2017-03-18 MED ORDER — CLONIDINE HCL 0.2 MG PO TABS
0.2000 mg | ORAL_TABLET | Freq: Once | ORAL | Status: AC
  Administered 2017-03-18: 0.2 mg via ORAL
  Filled 2017-03-18: qty 1

## 2017-03-18 MED ORDER — ONDANSETRON 4 MG PO TBDP
4.0000 mg | ORAL_TABLET | Freq: Once | ORAL | Status: AC
  Administered 2017-03-18: 4 mg via ORAL
  Filled 2017-03-18: qty 1

## 2017-03-18 NOTE — ED Provider Notes (Signed)
[facility] DEPT Provider Note   CSN: [id number] Arrival date & time: 03/18/17  0909     History   Chief Complaint Chief Complaint  Patient presents with  . Abdominal Pain    HPI Albert Eaton is a 51 y.o. male.  The history is provided by the patient. No language interpreter was used.  Abdominal Pain   This is a new problem. The current episode started 12 to 24 hours ago. The problem occurs constantly. The problem has been gradually worsening. The pain is associated with eating. The pain is located in the generalized abdominal region. The pain is severe. Associated symptoms include nausea and vomiting. Nothing aggravates the symptoms. Nothing relieves the symptoms. Past workup does not include GI consult. His past medical history does not include PUD.  Pt reports severe abdominal pain today.  Pt reports vomiting several times.  Pt reports he did not take his blood pressure medication due to nausea.  Past Medical History:  Diagnosis Date  . Chronic diastolic CHF (congestive heart failure) ([place]) 01/06/2017   Echo 7/16 [facility] in [place], [name]) Mild AI, mild LAE, mild concentric LVH, EF 55, normal wall motion, mild to moderate MR, mild PI, RVSP 55 // Echo 10/09/16 ([facility]):  Moderate LVH, grade 2 diastolic dysfunction, mild MR, moderate LAE   . CKD (chronic kidney disease)   . Gout   . History of nuclear stress test    a. Nuc study 7/16: no scar or ischemia, EF 42 // b. Nuc study 12/17: EF 48, ?small apical ischemia, Low Risk  . HIV (human immunodeficiency virus infection) ([place])   . HLD (hyperlipidemia)   . Hypertensive heart disease with CHF (congestive heart failure) ([place]) 10/09/2016  . OSA (obstructive sleep apnea)     Patient Active Problem List   Diagnosis Date Noted  . Arthritis, gouty 01/27/2017  . Chronic diastolic CHF (congestive heart failure) ([place]) 01/06/2017  . OSA (obstructive sleep apnea) 01/06/2017  . Abnormal electrocardiogram (ECG) (EKG)  10/31/2016  . Chest pain 10/09/2016  . HIV disease ([place]) 10/09/2016  . CKD stage 4 secondary to hypertension ([place]) 10/09/2016  . Hypertensive heart disease with CHF (congestive heart failure) ([place]) 10/09/2016    Past Surgical History:  Procedure Laterality Date  . CHOLECYSTECTOMY    . HERNIA REPAIR     As baby       Home Medications    Prior to Admission medications   Medication Sig Start Date End Date Taking? Authorizing Provider  abacavir (ZIAGEN) 300 MG tablet Take 1 tablet (300 mg total) by mouth daily. 10/30/16  Yes Campbell Riches, MD  allopurinol (ZYLOPRIM) 100 MG tablet Take 1 tablet (100 mg total) by mouth 2 (two) times daily. 01/02/17  Yes [name], FNP  amLODipine (NORVASC) 10 MG tablet Take 1 tablet (10 mg total) by mouth daily. 10/31/16  Yes [name], FNP  atorvastatin (LIPITOR) 40 MG tablet Take 1 tablet (40 mg total) by mouth daily. 10/31/16  Yes [name], FNP  carvedilol (COREG) 25 MG tablet Take 1 tablet (25 mg total) by mouth 2 (two) times daily with a meal. 12/09/16  Yes [name], MD  cloNIDine (CATAPRES) 0.1 MG tablet Take 1 tablet (0.1 mg total) by mouth 2 (two) times daily. 01/06/17  Yes Weaver, Scott T, PA-C  famotidine (PEPCID) 20 MG tablet Take 1 tablet (20 mg total) by mouth 2 (two) times daily. 01/06/17  Yes Weaver, Scott T, PA-C  furosemide (LASIX) 20 MG  tablet Take 2 tablets (40 mg total) by mouth daily. Patient taking differently: Take 60 mg by mouth 2 (two) times daily.  02/10/17 05/11/17 Yes Weaver, Scott T, PA-C  gabapentin (NEURONTIN) 300 MG capsule Take 1 capsule (300 mg total) by mouth 3 (three) times daily. 12/25/16  Yes [name], FNP  hydrALAZINE (APRESOLINE) 100 MG tablet Take 1 tablet (100 mg total) by mouth 3 (three) times daily. 12/09/16  Yes [name], MD  lamiVUDine (EPIVIR) 150 MG tablet Take 1 tablet (150 mg total) by mouth daily. 10/30/16  Yes Campbell Riches, MD  potassium chloride SA (KLOR-CON M20)  20 MEQ tablet Take 2 tablets (40 mEq total) by mouth daily. Patient taking differently: Take 30 mEq by mouth daily.  03/16/17 06/14/17 Yes Weaver, Scott T, PA-C  raltegravir (ISENTRESS) 400 MG tablet Take 1 tablet (400 mg total) by mouth 2 (two) times daily. 10/30/16  Yes Campbell Riches, MD  spironolactone (ALDACTONE) 25 MG tablet Take 0.5 tablets (12.5 mg total) by mouth daily. Patient taking differently: Take 25 mg by mouth daily.  01/08/17 04/08/17 Yes Weaver, Scott T, PA-C  zinc gluconate 50 MG tablet Take 100 mg by mouth daily.   Yes [provider]  allopurinol (ZYLOPRIM) 100 MG tablet TAKE 1 TABLET BY MOUTH DAILY 03/18/17   [name], FNP  colchicine 0.6 MG tablet Take 1 tablet (0.6 mg total) by mouth daily. Patient not taking: Reported on 03/18/2017 11/03/16   [name], FNP  isosorbide mononitrate (IMDUR) 60 MG 24 hr tablet Take 1 tablet (60 mg total) by mouth daily. Patient not taking: Reported on 03/18/2017 02/09/17 02/04/18  Liliane Shi, PA-C    Family History Family History  Problem Relation Age of Onset  . Heart failure Mother   . Heart attack Maternal Grandmother   . Hypertension Sister   . Hypertension Brother     Social History Social History  Substance Use Topics  . Smoking status: Former Smoker    Quit date: 2000  . Smokeless tobacco: Former Systems developer    Quit date: 09/10/1999  . Alcohol use 8.4 oz/week    14 Shots of liquor per week     Comment: Light use     Allergies   Lisinopril   Review of Systems Review of Systems  Gastrointestinal: Positive for abdominal pain, nausea and vomiting.  All other systems reviewed and are negative.    Physical Exam Updated Vital Signs BP (!) 197/116   Pulse 77   Temp 99.1 F (37.3 C) (Oral)   Resp 12   SpO2 97%   Physical Exam  Constitutional: He appears well-developed and well-nourished.  HENT:  Head: Normocephalic and atraumatic.  Eyes: Conjunctivae are normal.  Neck: Neck supple.    Cardiovascular: Normal rate and regular rhythm.   No murmur heard. Pulmonary/Chest: Effort normal and breath sounds normal. No respiratory distress.  Abdominal: Soft. There is tenderness.  Diffusely tender  Musculoskeletal: He exhibits no edema.  Neurological: He is alert.  Skin: Skin is warm and dry.  Psychiatric: He has a normal mood and affect.  Nursing note and vitals reviewed.    ED Treatments / Results  Labs (all labs ordered are listed, but only abnormal results are displayed) Labs Reviewed  COMPREHENSIVE METABOLIC PANEL - Abnormal; Notable for the following:       Result Value   Potassium 2.6 (*)    Chloride 99 (*)    Glucose, Bld 124 (*)  BUN 29 (*)    Creatinine, Ser 3.51 (*)    ALT 15 (*)    GFR calc non Af Amer 19 (*)    GFR calc Af Amer 22 (*)    All other components within normal limits  CBC - Abnormal; Notable for the following:    HCT 38.1 (*)    MCHC 36.5 (*)    All other components within normal limits  URINALYSIS, ROUTINE W REFLEX MICROSCOPIC - Abnormal; Notable for the following:    APPearance HAZY (*)    Glucose, UA 50 (*)    Hgb urine dipstick SMALL (*)    Protein, ur >=300 (*)    Squamous Epithelial / LPF 0-5 (*)    All other components within normal limits  LIPASE, BLOOD    EKG  EKG Interpretation None       Radiology Ct Abdomen Pelvis Wo Contrast  Result Date: 03/18/2017 CLINICAL DATA:  51 year old with abdominal pain and vomiting. EXAM: CT ABDOMEN AND PELVIS WITHOUT CONTRAST TECHNIQUE: Multidetector CT imaging of the abdomen and pelvis was performed following the standard protocol without IV contrast. COMPARISON:  None. FINDINGS: Lower chest: Lung bases are clear. Hepatobiliary: Small amount of perihepatic ascites. Gallbladder has been removed. Question minimal nodularity along the anterior aspect of the liver. Pancreas: Normal appearance of the pancreas without inflammation or duct dilatation. Spleen: Normal appearance of spleen  without enlargement. Adrenals/Urinary Tract: Normal adrenal glands. Possible cyst in the posterior left kidney. Negative for kidney stones or hydronephrosis. Urinary bladder is within normal limits. Indeterminate hyperdense exophytic lesion in the right kidney lower pole measuring up to 9 mm. Stomach/Bowel: Normal appearance of the stomach and duodenum. There are mildly dilated loops of small bowel in the mid and lower right abdomen. These bowel loops also demonstrate wall thickening measuring roughly 9 mm. There is mesenteric edema and fluid associated with these abnormal loops of small bowel. There is no significant dilatation of the proximal small bowel loops. No gross abnormality to the terminal ileum although bowel inflammation appears to be involving the distal ileum. There appears to be a normal appendix. Normal appearance of the colon. Vascular/Lymphatic: Wall calcifications involving the aorta and iliac arteries without aortic aneurysm. No significant lymph node enlargement in the abdomen or pelvis. Reproductive: Prostate is unremarkable. Other: Small amount of free fluid in the pelvis. Mesenteric edema and fluid around the abnormal loops of small bowel in the lower right abdomen. Trace fluid in the left paracolic gutter. Trace fluid around spleen. Negative for free air. Musculoskeletal: No acute bone abnormality. IMPRESSION: Abnormal loops of the distal small bowel with wall thickening and adjacent mesenteric edema and fluid. Findings are suggestive for small bowel inflammation and enteritis. No evidence for a bowel obstruction. Small amount of free fluid in the abdomen and pelvis. Indeterminate small renal lesions. Consider further characterization with a non emergent renal ultrasound. Electronically Signed   By: Markus Daft M.D.   On: 03/18/2017 15:03    Procedures Procedures (including critical care time)  Medications Ordered in ED Medications  HYDROmorphone (DILAUDID) injection 1 mg (1 mg  Intravenous Given 03/18/17 1048)  ondansetron (ZOFRAN) injection 4 mg (4 mg Intravenous Given 03/18/17 1048)  HYDROmorphone (DILAUDID) injection 1 mg (1 mg Intravenous Given 03/18/17 1235)  cloNIDine (CATAPRES) tablet 0.2 mg (0.2 mg Oral Given 03/18/17 1522)  potassium chloride SA (K-DUR,KLOR-CON) CR tablet 40 mEq (40 mEq Oral Given 03/18/17 1522)  HYDROmorphone (DILAUDID) injection 1 mg (1 mg Intravenous Given 03/18/17 1649)  ondansetron (ZOFRAN-ODT) disintegrating tablet 4 mg (4 mg Oral Given 03/18/17 1658)     Initial Impression / Assessment and Plan / ED Course  I have reviewed the triage vital signs and the nursing notes.  Pertinent labs & imaging results that were available during my care of the patient were reviewed by me and considered in my medical decision making (see chart for details).     Pt given IV fluids, slowly due to history of CHF.  Pt given zofran and dilaudid,   Pt has a history of renal disease.  Pt has frequently low potassium.   Pt has low potassium today.  Pt feels better after medications and fluids.  Ct scan shows colitis with bowel thickening.  Due to pt's history I will treat with antibiotics.  Pt looks much better, ambulating and tolerating fluids without difficulty.  Pt is advised to take his normal evening medication dosage of blood pressure medications.    Final Clinical Impressions(s) / ED Diagnoses   Final diagnoses:  Colitis  Hypokalemia    New Prescriptions Discharge Medication List as of 03/18/2017  6:52 PM    START taking these medications   Details  !! allopurinol (ZYLOPRIM) 100 MG tablet TAKE 1 TABLET BY MOUTH DAILY, Normal    amoxicillin-clavulanate (AUGMENTIN) 875-125 MG tablet Take 1 tablet by mouth every 12 (twelve) hours., Starting Wed 03/18/2017, Print    metroNIDAZOLE (FLAGYL) 500 MG tablet Take 1 tablet (500 mg total) by mouth 2 (two) times daily., Starting Wed 03/18/2017, Print    !! ondansetron (ZOFRAN ODT) 4 MG disintegrating tablet Take  1 tablet (4 mg total) by mouth every 8 (eight) hours as needed for nausea or vomiting., Starting Wed 03/18/2017, Print    !! ondansetron (ZOFRAN ODT) 4 MG disintegrating tablet Take 1 tablet (4 mg total) by mouth every 8 (eight) hours as needed for nausea or vomiting., Starting Wed 03/18/2017, Print    !! oxyCODONE-acetaminophen (PERCOCET/ROXICET) 5-325 MG tablet Take 2 tablets by mouth every 4 (four) hours as needed for severe pain., Starting Wed 03/18/2017, Print    !! oxyCODONE-acetaminophen (PERCOCET/ROXICET) 5-325 MG tablet Take 2 tablets by mouth every 4 (four) hours as needed for severe pain., Starting Wed 03/18/2017, Print     !! - Potential duplicate medications found. Please discuss with provider.    An After Visit Summary was printed and given to the patient.    Fransico Meadow, PA-C 03/18/17 1925    Fransico Meadow, PA-C 03/18/17 Elvina Sidle, MD 03/20/17 [phone]

## 2017-03-18 NOTE — Discharge Instructions (Signed)
Keep taking your potassium.   See your Physicain for recheck in 2 days.  Return if symptoms worsen or cahnge.

## 2017-03-18 NOTE — ED Triage Notes (Signed)
PT is here with abdominal pain that started last night and comes in waves.  Pt denies diarrhea or constipation.  Pt reports vomiting bile.

## 2017-03-18 NOTE — ED Notes (Signed)
PA notified of potassium 2.6

## 2017-03-18 NOTE — ED Notes (Signed)
Patient returned from CT

## 2017-03-18 NOTE — ED Notes (Signed)
Provider notified of patient's blood pressure.

## 2017-03-18 NOTE — ED Notes (Signed)
Notified CT patient able to tolerate one bottle of oral contrast.

## 2017-03-18 NOTE — ED Notes (Signed)
Pt able to keep down fluids

## 2017-03-18 NOTE — ED Notes (Addendum)
Notified provider patient currently at CT and continued blood pressure being high.

## 2017-03-20 ENCOUNTER — Telehealth: Payer: Self-pay

## 2017-03-20 ENCOUNTER — Encounter: Payer: Self-pay | Admitting: Family Medicine

## 2017-03-20 MED FILL — AMOX-CLAV 875-125 MG TABLET: 875-125 | 7 days supply | Qty: 14 | Fill #0

## 2017-03-20 MED FILL — metroNIDAZOLE 500 MG TABS: 500 | 7 days supply | Qty: 14 | Fill #0

## 2017-03-20 MED FILL — ONDANSETRON ODT 4 MG TABLET: 4 | 6 days supply | Qty: 20 | Fill #0

## 2017-03-20 NOTE — Telephone Encounter (Signed)
Spoke with patient. He was concerned that the antibiotics he was taking were going to affect his kidney disease. I spoke with provider regarding these concerns. Albert Eaton, advised that he can safely take these medications and to avoid taking any ibuprofen at this time. I advised that we would continue to monitor kidney function at next appointment. Advised that patient should report to ER if any increased pain. Patient verbalized understanding.  Thanks!

## 2017-03-20 NOTE — Progress Notes (Signed)
   Subjective:    Patient ID: Tran Arzuaga Licausi, male    DOB: 1966-05-06, 51 y.o.   MRN: [id number]  HPI  Mr. Mitchelle Commerford, a 51 year old male presented for a follow-up. He was evaluated in the emergency department on 5/23 for colitis. He did not start any of the medications prescribed. Patient advised to pick up medications from [facility] due to financial constraints. Mr. Diveley is to follow up in 1 week.   Review of Systems        Objective:   Physical Exam       BP 110/62 (BP Location: Right Arm, Patient Position: Sitting, Cuff Size: Normal) Comment: manual  Pulse 88   Temp 98.7 F (37.1 C) (Oral)   Resp 14   Ht 5' 10"  (1.778 m)   Wt 209 lb (94.8 kg)   SpO2 99%   BMI 29.99 kg/m  Assessment & Plan:  [name]  MSN, FNP-C [address], [place] [id number] [phone]   This encounter was created in error - please disregard.

## 2017-03-26 ENCOUNTER — Ambulatory Visit: Payer: Self-pay | Admitting: Family Medicine

## 2017-03-30 ENCOUNTER — Ambulatory Visit (INDEPENDENT_AMBULATORY_CARE_PROVIDER_SITE_OTHER): Payer: Medicaid Other | Admitting: Family Medicine

## 2017-03-30 ENCOUNTER — Encounter: Payer: Self-pay | Admitting: Family Medicine

## 2017-03-30 ENCOUNTER — Other Ambulatory Visit: Payer: Self-pay

## 2017-03-30 VITALS — BP 142/102 | HR 84 | Temp 98.4°F | Resp 16 | Ht 70.0 in | Wt 209.0 lb

## 2017-03-30 DIAGNOSIS — J302 Other seasonal allergic rhinitis: Secondary | ICD-10-CM | POA: Diagnosis not present

## 2017-03-30 DIAGNOSIS — L299 Pruritus, unspecified: Secondary | ICD-10-CM

## 2017-03-30 DIAGNOSIS — N289 Disorder of kidney and ureter, unspecified: Secondary | ICD-10-CM | POA: Diagnosis not present

## 2017-03-30 DIAGNOSIS — I110 Hypertensive heart disease with heart failure: Secondary | ICD-10-CM | POA: Diagnosis not present

## 2017-03-30 DIAGNOSIS — I129 Hypertensive chronic kidney disease with stage 1 through stage 4 chronic kidney disease, or unspecified chronic kidney disease: Secondary | ICD-10-CM

## 2017-03-30 DIAGNOSIS — N184 Chronic kidney disease, stage 4 (severe): Secondary | ICD-10-CM | POA: Diagnosis not present

## 2017-03-30 DIAGNOSIS — M109 Gout, unspecified: Secondary | ICD-10-CM

## 2017-03-30 LAB — POCT URINALYSIS DIP (DEVICE)
BILIRUBIN URINE: NEGATIVE
Glucose, UA: NEGATIVE mg/dL
Hgb urine dipstick: NEGATIVE
Ketones, ur: NEGATIVE mg/dL
LEUKOCYTES UA: NEGATIVE
NITRITE: NEGATIVE
Protein, ur: >=300 mg/dL — AB
Specific Gravity, Urine: 1.025 (ref 1.005–1.030)
UROBILINOGEN UA: 0.2 mg/dL (ref 0.0–1.0)
pH: 5.5 (ref 5.0–8.0)

## 2017-03-30 MED ORDER — MICONAZOLE NITRATE 2 % EX POWD: CUTANEOUS | 0 refills | Status: DC | PRN

## 2017-03-30 MED ORDER — LEVOCETIRIZINE DIHYDROCHLORIDE 5 MG PO TABS: 5.0000 mg | ORAL_TABLET | Freq: Every evening | ORAL | 1 refills | Status: DC

## 2017-03-30 NOTE — Progress Notes (Signed)
Subjective:    Patient ID: Albert Eaton, male    DOB: 05-06-1966, 51 y.o.   MRN: [id number]  HPI   [name], a 51 year old male with a history of hypertension, HIV and gout presents for a follow up of gout. The patient reports that he had increased gout pain over the weekend, primarily to the right ankle.  Attacks occur primarily in the ankles bilaterally. [name] has been taking allopurinol consistently. He has not been following a low purine diet.   He states that he is not consuming any alcoholic beverages.  He has a history of HIV and takes antiviral medications consistently.    [name] is also here for evaluation of possible allergic rhinitis. Patient's symptoms include cough, itchy eyes, itchy nose, nasal congestion, postnasal drip and watery eyes. Symptoms started 2 days ago. He denies contacts with similar symptoms. The patient has tried Flonase once since symptoms started without relief.    [name] is also complaining of itching to axilla bilaterally. He has changed deodorant twice without relief.   Past Medical History:  Diagnosis Date  . Chronic diastolic CHF (congestive heart failure) ([place]) 01/06/2017   Echo 7/16 [facility] in [place], [name]) Mild AI, mild LAE, mild concentric LVH, EF 55, normal wall motion, mild to moderate MR, mild PI, RVSP 55 // Echo 10/09/16 ([facility]):  Moderate LVH, grade 2 diastolic dysfunction, mild MR, moderate LAE   . CKD (chronic kidney disease)   . Gout   . History of nuclear stress test    a. Nuc study 7/16: no scar or ischemia, EF 42 // b. Nuc study 12/17: EF 48, ?small apical ischemia, Low Risk  . HIV (human immunodeficiency virus infection) ([place])   . HLD (hyperlipidemia)   . Hypertensive heart disease with CHF (congestive heart failure) ([place]) 10/09/2016  . OSA (obstructive sleep apnea)    Immunization History  Administered Date(s) Administered  . Influenza,inj,Quad PF,36+ Mos 10/10/2016  . Meningococcal Mcv4o 10/15/2016   . Pneumococcal Polysaccharide-23 10/15/2016  . Tdap 01/27/2017   Social History   Social History Narrative  . No narrative on file    Review of Systems  Constitutional: Negative.   HENT: Negative.   Eyes: Negative.   Respiratory: Negative.  Negative for chest tightness and shortness of breath.   Cardiovascular: Positive for palpitations. Negative for leg swelling.  Endocrine: Negative.  Negative for polydipsia, polyphagia and polyuria.  Genitourinary: Negative.   Musculoskeletal: Negative.   Skin: Negative.   Allergic/Immunologic: Positive for immunocompromised state (HIV +).  Neurological: Positive for numbness (bilateral upper extremities).  Hematological: Negative.   Psychiatric/Behavioral: Negative.         Objective:   Physical Exam  HENT:  Head: Normocephalic and atraumatic.  Right Ear: External ear normal.  Left Ear: External ear normal.  Nose: Nose normal.  Mouth/Throat: Oropharynx is clear and moist.  Eyes: Conjunctivae and EOM are normal. Pupils are equal, round, and reactive to light.  Neck: Normal range of motion. Neck supple.  Cardiovascular: An irregular rhythm present.  Murmur heard. Pulmonary/Chest: Effort normal and breath sounds normal.  Abdominal: Soft. Bowel sounds are normal.       BP (!) 142/102 Comment: manual  Pulse 84   Temp 98.4 F (36.9 C) (Oral)   Resp 16   Ht 5' 10"  (1.778 m)   Wt 209 lb (94.8 kg)   SpO2 99%   BMI 29.99 kg/m  Assessment & Plan:  1. Hypertensive heart disease  with congestive heart failure, unspecified heart failure type ([place]) Blood pressure has improved since previous visit, continued current medications.  - COMPLETE METABOLIC PANEL WITH GFR  2. Arthritis, gouty Previous uric acid was 7.1 in April 2018. I will check uric acid level today. The nature of gout was fully explained, including dietary relationship, acute and interval phase and treatment of both. Long term complications such as kidney stones, tophi and  arthritis are discussed. Avoidance of alcohol recommended, and written literature is given along with a low purine diet. Indications for the use of allopurinol for prophylaxis treat flare-ups is also discussed. Proper use of indomethacin for acute attacks discussed, and its side effects. Call if further attacks occur, or this one does not resolve promptly. - Uric Acid  3. CKD stage 4 secondary to hypertension [facility]) Patient has been unable to follow up with nephrology due to financial constraints. Will send referral to [facility].  - US Renal; Future - COMPLETE METABOLIC PANEL WITH GFR  4. Itching with irritation Apply miconazole powder to axilla daily as needed.  - miconazole (MICRO GUARD) 2 % powder; Apply topically as needed for itching.  Dispense: 70 g; Refill: 0  5. Renal lesion Reviewed CT of abdomen and pelvis from 5/23. Showed indeterminate small renal lesions. I will evaluate further with a renal ultrasound.  - US Renal; Future  6. Seasonal allergic rhinitis, unspecified trigger Increase fluid intake, rest, and handwashing - levocetirizine (XYZAL) 5 MG tablet; Take 1 tablet (5 mg total) by mouth every evening.  Dispense: 30 tablet; Refill: 1   Preventative maintenance:  Patient has not had a colonoscopy  Greater than 1 year since last prostate exam Last eye exam was December 2014    Taylr Meuth Al Decant  MSN, FNP-C [place], [place] [phone]

## 2017-03-30 NOTE — Patient Instructions (Addendum)
Gouty arthritis:  Will check uric acid level and follow up by phone   Hypertensive Heart disease:  Blood pressure has improved since previous appointment   Stage 4 chronic kidney disease:  Will schedule a renal ultrasound  Continue to take antihypertensive medications consistently   Allergic rhinitis:  Start Xyzal 5 mg at bedtime Continue Flonase as previously prescribed Increase rest, handwashing, and vitamin C intake  Axillary itching:   Apply microguard powder to axilla daily

## 2017-03-31 ENCOUNTER — Encounter: Payer: Self-pay | Admitting: Family Medicine

## 2017-03-31 ENCOUNTER — Other Ambulatory Visit: Payer: Self-pay

## 2017-03-31 DIAGNOSIS — L299 Pruritus, unspecified: Secondary | ICD-10-CM

## 2017-03-31 DIAGNOSIS — J302 Other seasonal allergic rhinitis: Secondary | ICD-10-CM

## 2017-03-31 LAB — COMPLETE METABOLIC PANEL WITH GFR
ALT: 11 U/L (ref 9–46)
AST: 15 U/L (ref 10–35)
Albumin: 3.6 g/dL (ref 3.6–5.1)
Alkaline Phosphatase: 57 U/L (ref 40–115)
BUN: 37 mg/dL — AB (ref 7–25)
CALCIUM: 9.1 mg/dL (ref 8.6–10.3)
CO2: 22 mmol/L (ref 20–31)
CREATININE: 2.97 mg/dL — AB (ref 0.70–1.33)
Chloride: 101 mmol/L (ref 98–110)
GFR, Est African American: 27 mL/min — ABNORMAL LOW (ref >=60)
GFR, Est Non African American: 23 mL/min — ABNORMAL LOW (ref >=60)
GLUCOSE: 96 mg/dL (ref 65–99)
POTASSIUM: 3.4 mmol/L — AB (ref 3.5–5.3)
SODIUM: 138 mmol/L (ref 135–146)
Total Bilirubin: 0.4 mg/dL (ref 0.2–1.2)
Total Protein: 6.8 g/dL (ref 6.1–8.1)

## 2017-03-31 LAB — URIC ACID: Uric Acid, Serum: 8.1 mg/dL — ABNORMAL HIGH (ref 4.0–8.0)

## 2017-03-31 MED ORDER — MICONAZOLE NITRATE 2 % EX POWD: CUTANEOUS | 0 refills | Status: DC | PRN

## 2017-03-31 MED ORDER — LEVOCETIRIZINE DIHYDROCHLORIDE 5 MG PO TABS: 5.0000 mg | ORAL_TABLET | Freq: Every evening | ORAL | 1 refills | Status: DC

## 2017-03-31 NOTE — Telephone Encounter (Signed)
Refilled medication to corrected pharmacy. Thanks!

## 2017-04-01 ENCOUNTER — Other Ambulatory Visit: Payer: Self-pay | Admitting: Infectious Diseases

## 2017-04-01 DIAGNOSIS — B20 Human immunodeficiency virus [HIV] disease: Secondary | ICD-10-CM

## 2017-04-02 ENCOUNTER — Telehealth: Payer: Self-pay

## 2017-04-02 ENCOUNTER — Ambulatory Visit (HOSPITAL_COMMUNITY)
Admission: RE | Admit: 2017-04-02 | Discharge: 2017-04-02 | Disposition: A | Discharge status: Home / Self Care | Payer: Medicaid Other | Source: Ambulatory Visit | Attending: Family Medicine | Admitting: Family Medicine

## 2017-04-02 DIAGNOSIS — I129 Hypertensive chronic kidney disease with stage 1 through stage 4 chronic kidney disease, or unspecified chronic kidney disease: Secondary | ICD-10-CM | POA: Diagnosis not present

## 2017-04-02 DIAGNOSIS — N281 Cyst of kidney, acquired: Secondary | ICD-10-CM | POA: Insufficient documentation

## 2017-04-02 DIAGNOSIS — N184 Chronic kidney disease, stage 4 (severe): Secondary | ICD-10-CM | POA: Insufficient documentation

## 2017-04-02 DIAGNOSIS — N289 Disorder of kidney and ureter, unspecified: Secondary | ICD-10-CM

## 2017-04-02 NOTE — Telephone Encounter (Signed)
Called, no answer. Left message for patient to call back. Thanks!

## 2017-04-02 NOTE — Telephone Encounter (Signed)
-----   Message from [name],  sent at 04/01/2017  9:57 PM EDT ----- Regarding: lab results  Please inform Mr. Grivas that kidney functioning has improved slightly. Continue anti-hypertensive medications consistently. Will follow up by phone following renal ultrasound.  Inquire whether Mr. Delmundo has gone online to apply for [employer] .   Also, call [facility] to inquire about cash pay options.   Thanks ----- Message ----- From: Interface, Lab [facility] Sent: 03/31/2017   1:34 AM To: [name], FNP

## 2017-04-03 NOTE — Telephone Encounter (Signed)
Called, no answer. Left message for patient to return call. Thanks!

## 2017-04-05 IMAGING — US US RENAL
1 series · 14 of 25 positions shown · non-contrast
Comparison: [DATE]

CLINICAL DATA: Chronic kidney disease

EXAM:
RENAL / URINARY TRACT ULTRASOUND COMPLETE

[Series 1: us renal · 0.25mm/px · 14 of 48 slices shown]
[im 1/48]
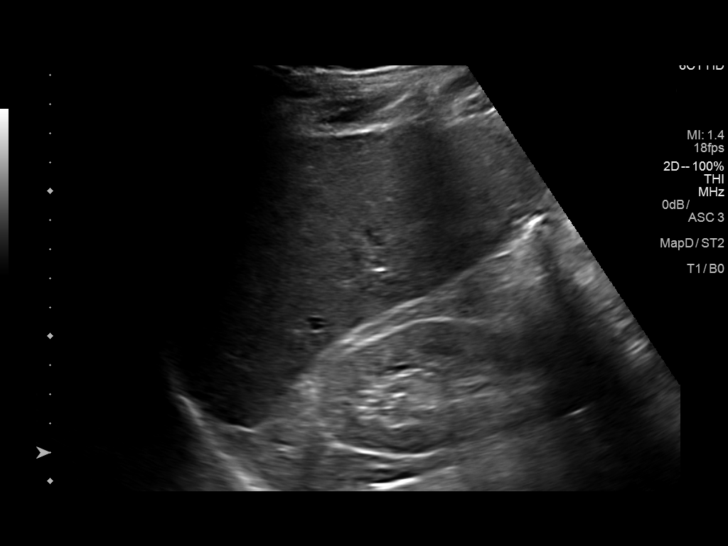
[im 4/48]
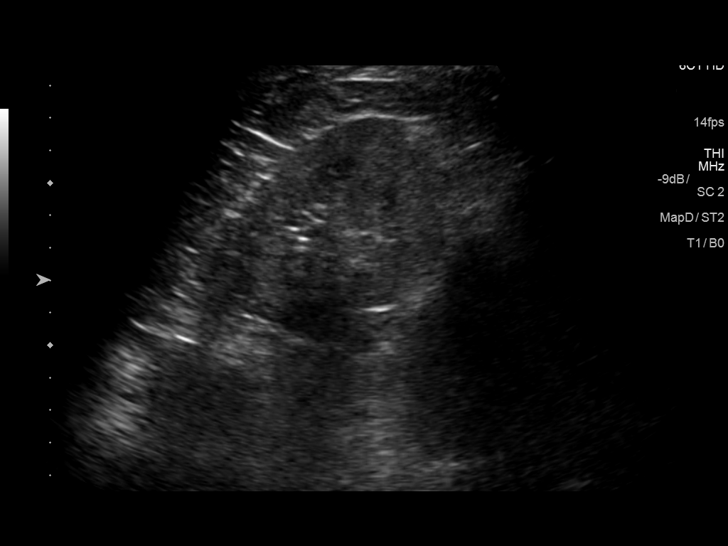
[im 8/48]
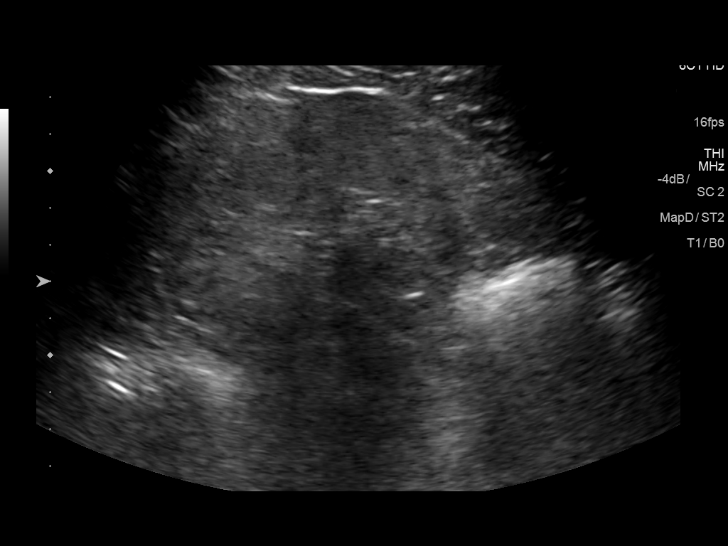
[im 12/48]
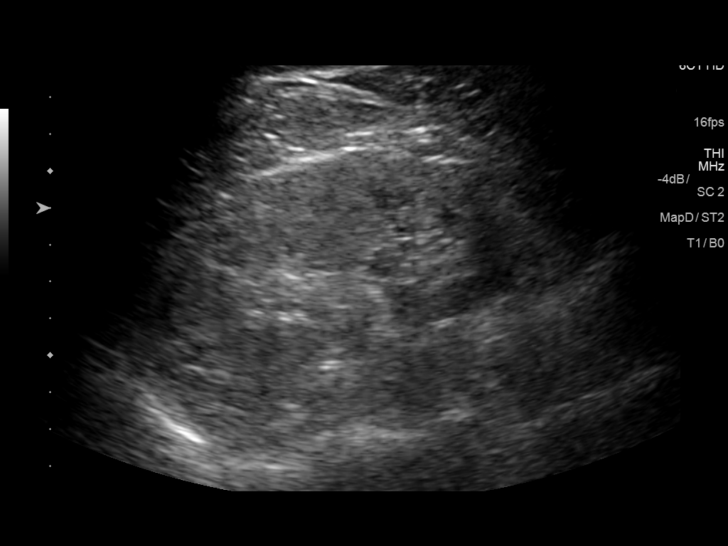
[im 16/48]
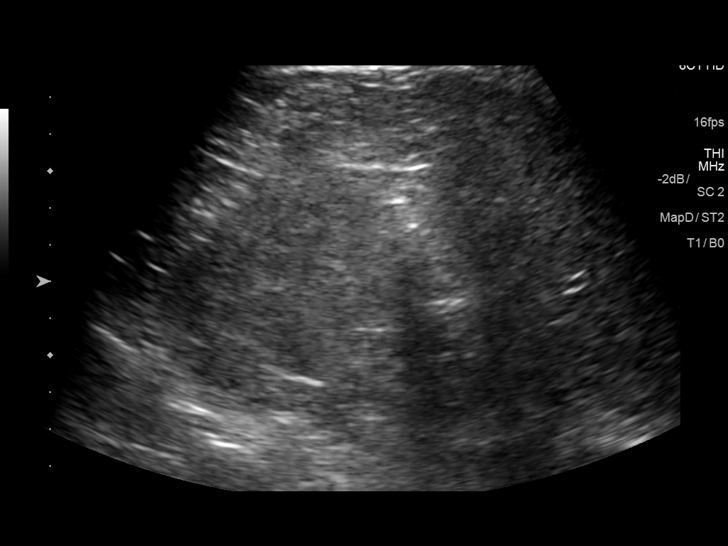
[im 18/48]
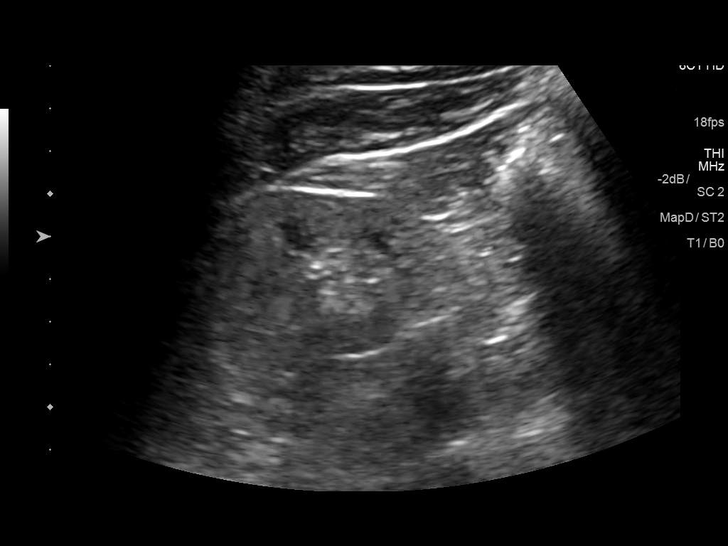
[im 22/48]
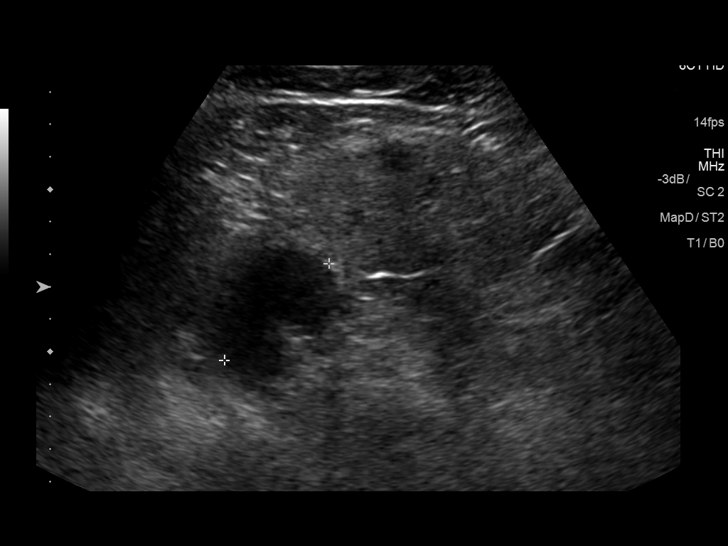
[im 26/48]
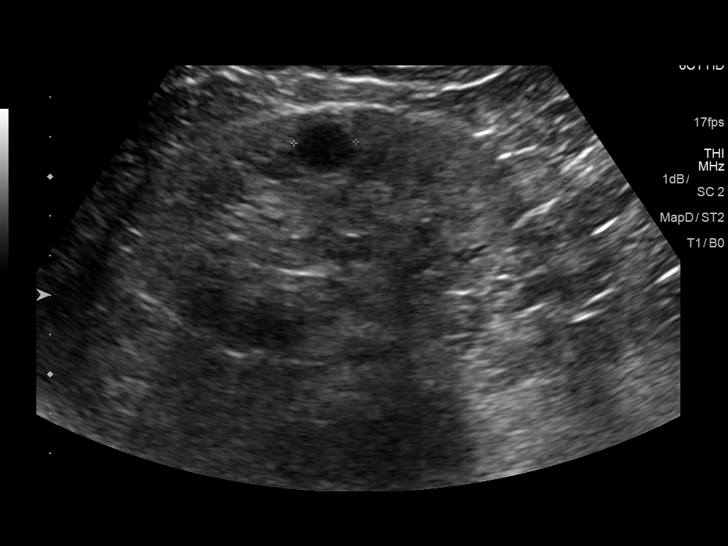
[im 30/48]
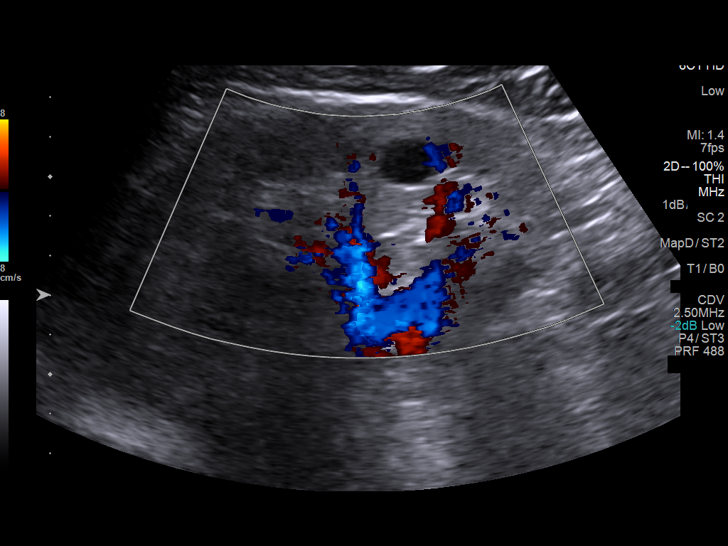
[im 32/48]
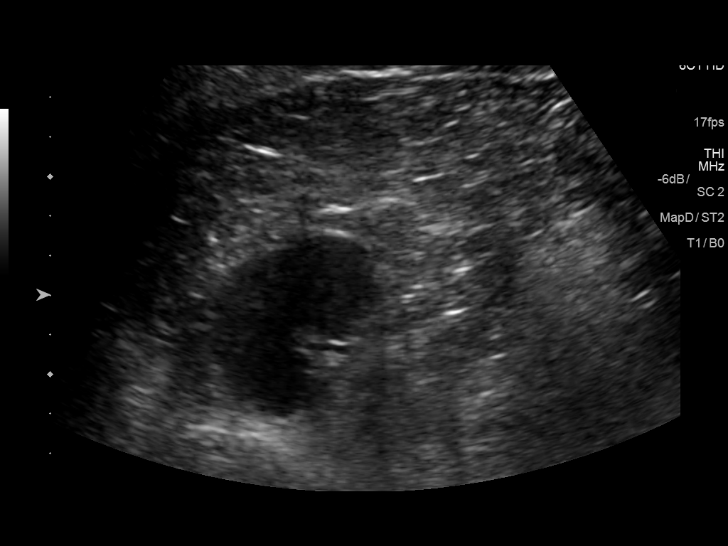
[im 36/48]
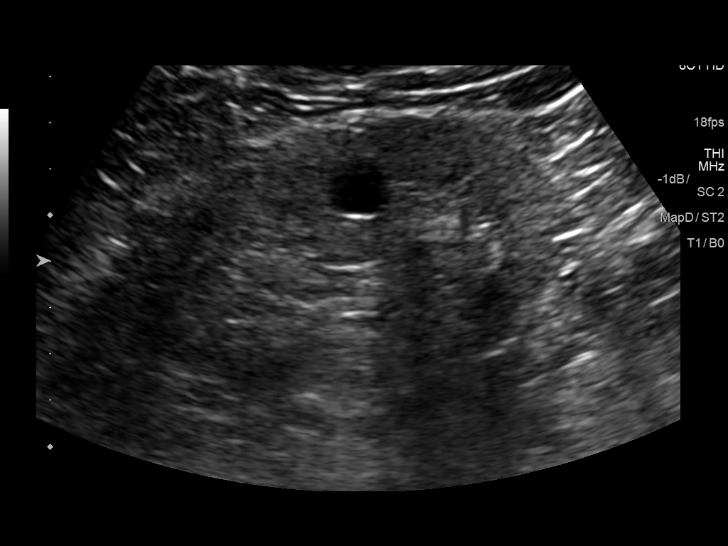
[im 40/48]
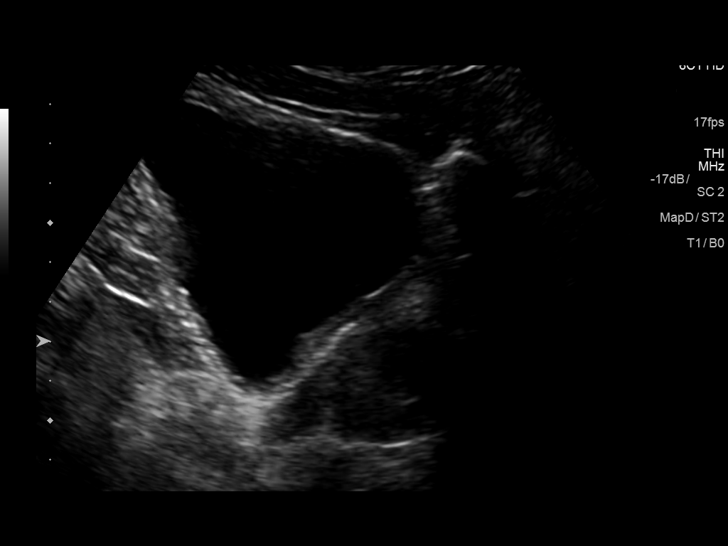
[im 44/48]
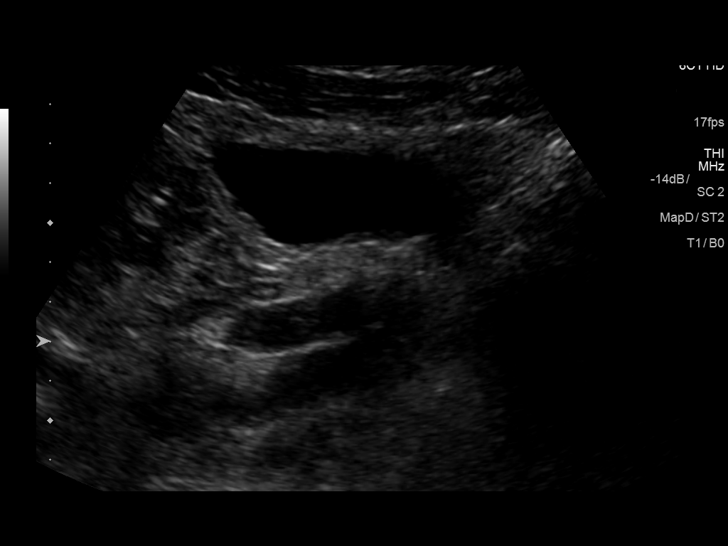
[im 48/48]
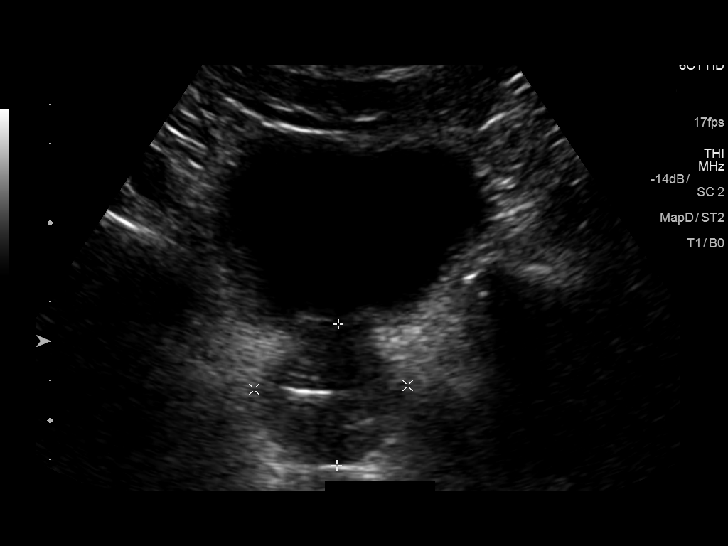

[14 of 25 positions shown; findings below may reference images not displayed]

FINDINGS: Right Kidney:

Length: 11.1 cm. Increased echotexture throughout the right kidney.
Small cyst in the upper pole measures 2.2 cm. No hydronephrosis.

Left Kidney:

Length: 10.9 cm. Increased echotexture throughout the left kidney.
1.6 cm simple cyst in the Mid pole. Mildly complex 4.4 cm cyst in
the upper pole. No hydronephrosis.

Bladder:

Appears normal for degree of bladder distention.
IMPRESSION: Increased echotexture in the kidneys bilaterally compatible chronic
medical renal disease.

No hydronephrosis.

## 2017-04-21 MED FILL — hydrALAZINE HCL 100 MG TABS: 100 | 30 days supply | Qty: 90 | Fill #0 | Status: TO

## 2017-04-21 MED FILL — POTASSIUM CL ER 20 MEQ TAB: 20 | 30 days supply | Qty: 60 | Fill #0

## 2017-04-21 MED FILL — CARVEDILOL 25 MG TABLET: 25 | 30 days supply | Qty: 60 | Fill #0 | Status: TO

## 2017-04-21 MED FILL — ?FUROSEMIDE 20 MG TABLET: 20 | 30 days supply | Qty: 180 | Fill #0 | Status: TO

## 2017-04-21 MED FILL — ALLOPURINOL 100 MG TABLET: 100 | 30 days supply | Qty: 60 | Fill #0

## 2017-04-21 MED FILL — ?SPIRONOLACTONE 25 MG TABLE: 25 | 30 days supply | Qty: 45 | Fill #0 | Status: TO

## 2017-04-21 MED FILL — ISOSORBIDE MN ER 60 MG TAB: 60 | 30 days supply | Qty: 30 | Fill #0

## 2017-04-28 IMAGING — US US BIOPSY
1 series · 7 of 7 positions shown · non-contrast
Comparison: none

CLINICAL DATA: Chronic kidney disease, hypertension, HIV,
proteinuria and edema. The patient requires renal biopsy.

[Series 1: us biopsy · 0.22mm/px · 7 of 7 slices shown]
[im 1/7]
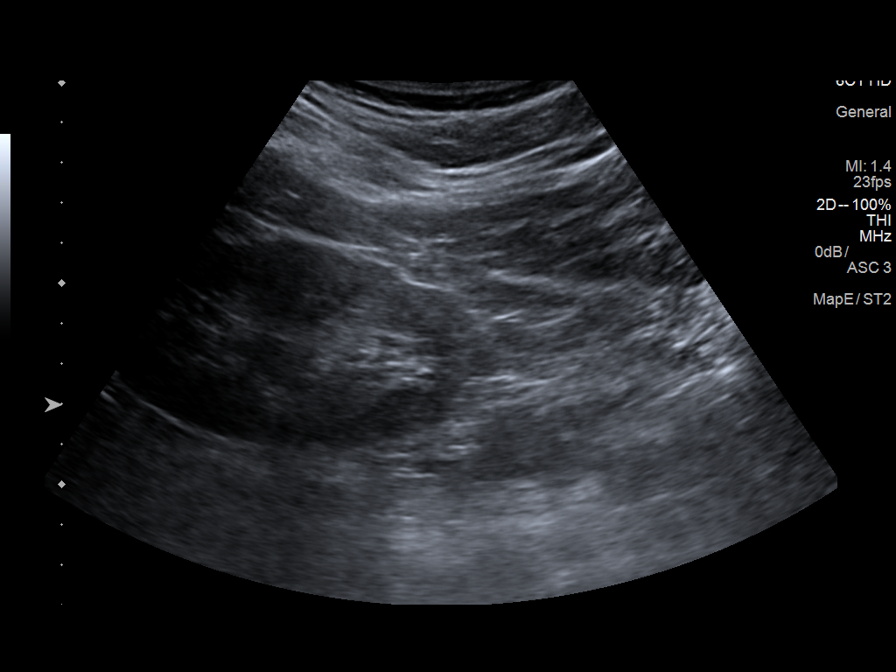
[im 2/7]
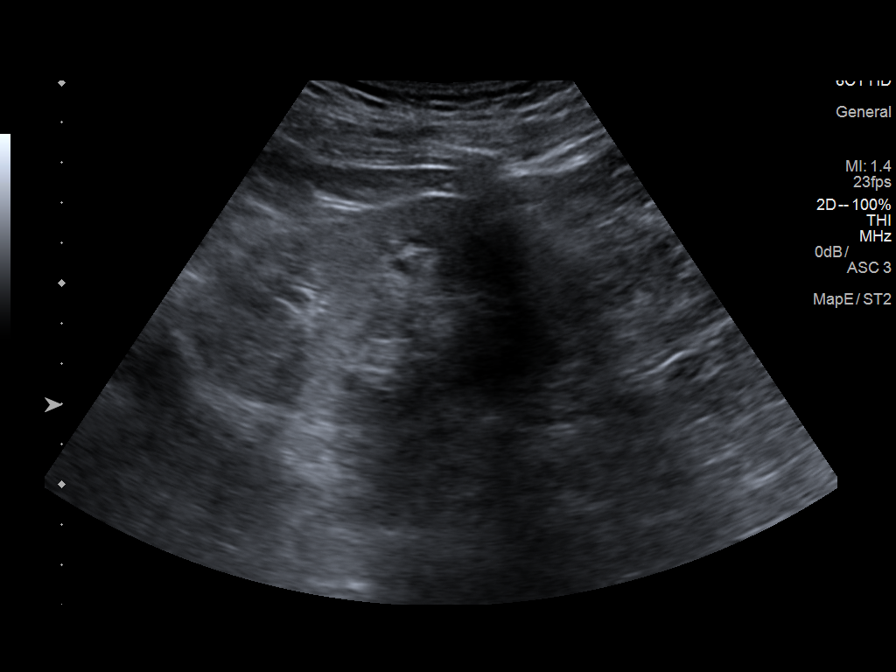
[im 3/7]
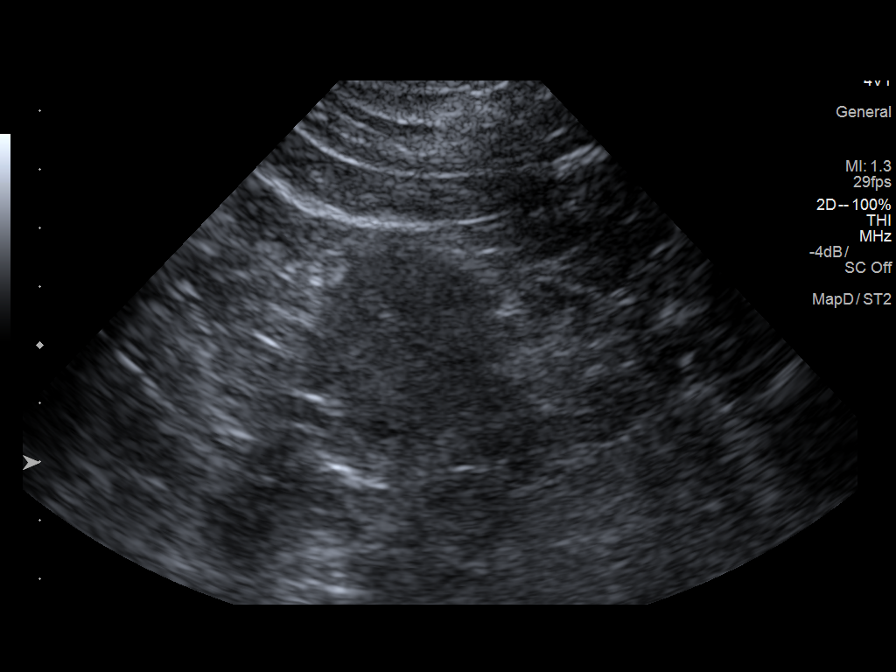
[im 4/7]
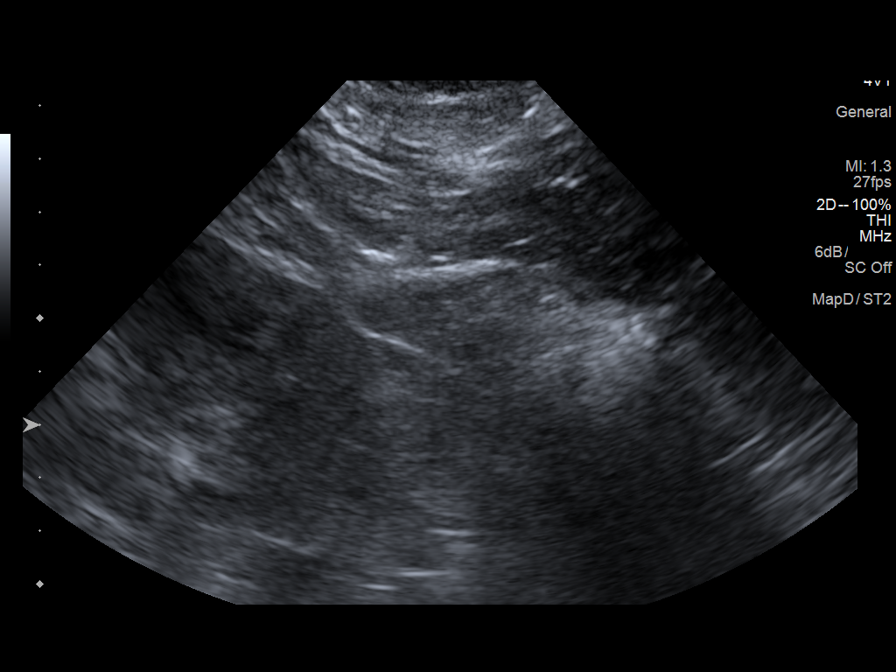
[im 5/7]
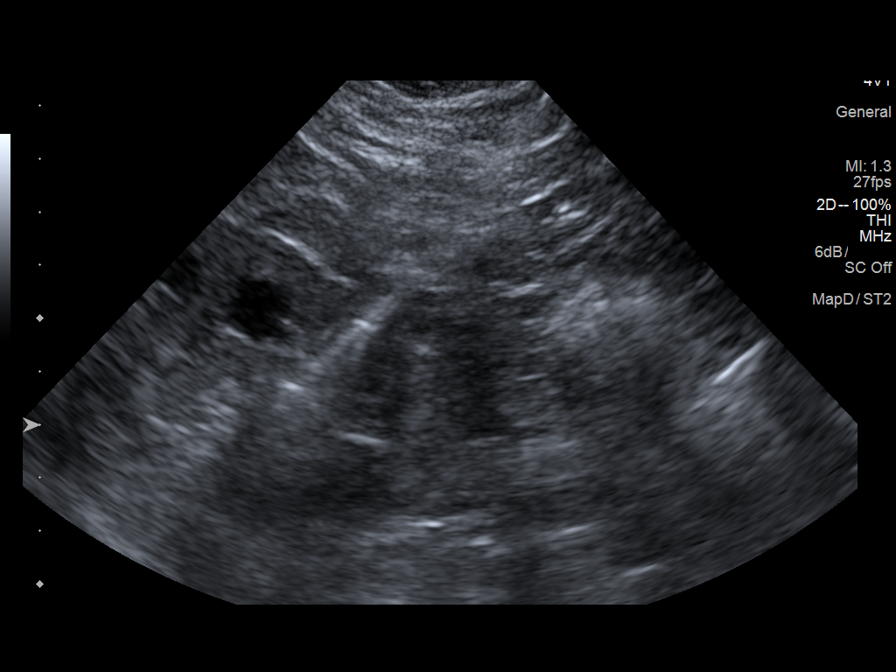
[im 6/7]
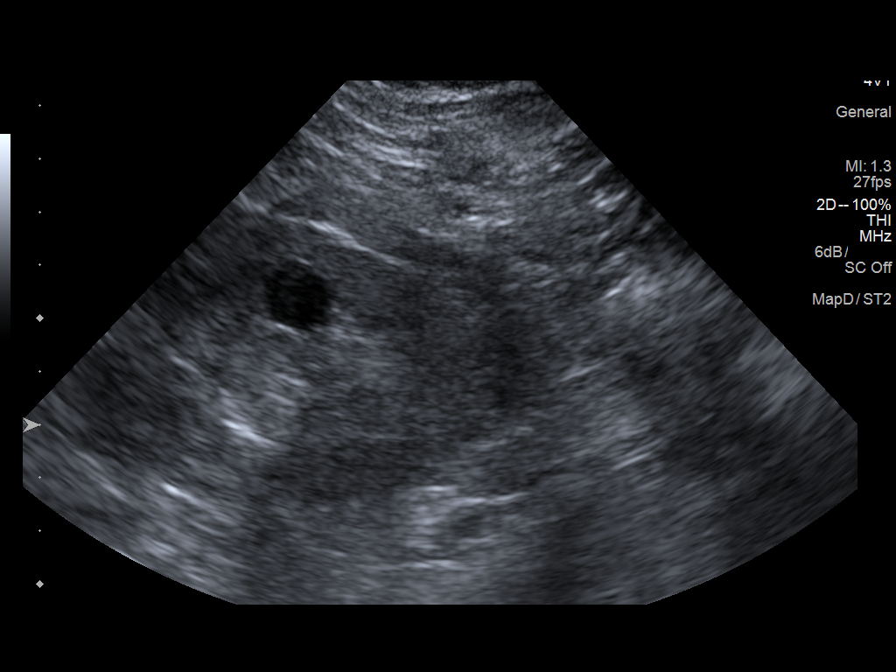
[im 7/7]
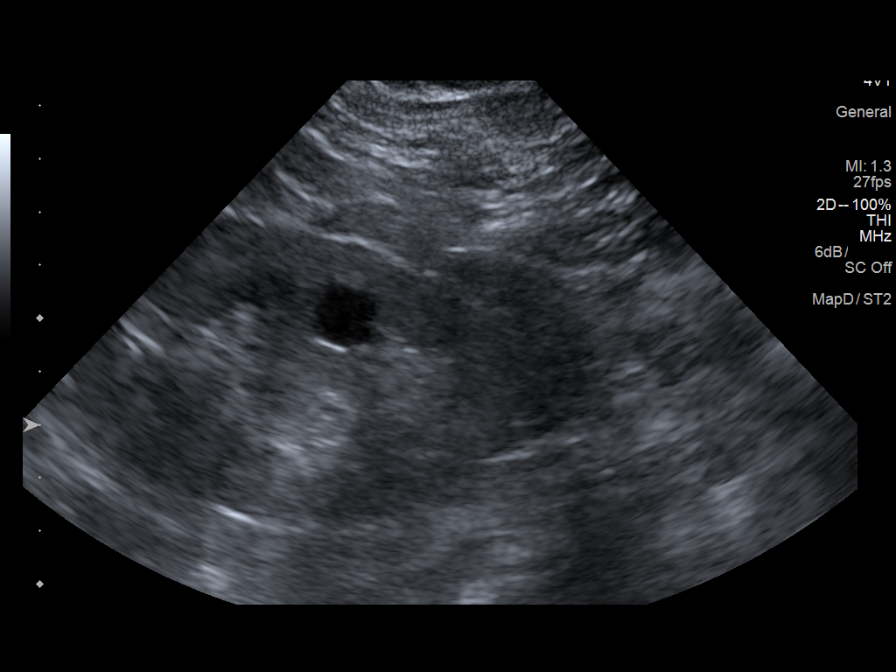

[7 of 7 positions shown; findings below may reference images not displayed]

EXAM:
ULTRASOUND GUIDED CORE BIOPSY OF LEFT KIDNEY

MEDICATIONS:
1.0 mg IV Versed; 50 mcg IV Fentanyl

Total Moderate Sedation Time: 10 minutes.

The patient's level of consciousness and physiologic status were
continuously monitored during the procedure by Radiology nursing.

PROCEDURE:
The procedure, risks, benefits, and alternatives were explained to
the patient. Questions regarding the procedure were encouraged and
answered. The patient understands and consents to the procedure. A
time out was performed prior to initiating the procedure.

Ultrasound was performed to localize the kidneys. The left flank
region was prepped with chlorhexidine in a sterile fashion, and a
sterile drape was applied covering the operative field. A sterile
gown and sterile gloves were used for the procedure. Local
anesthesia was provided with 1% Lidocaine.

Under ultrasound guidance, 2 separate 16 gauge core biopsy samples
were obtained at the level of left lower pole renal cortex. Post
biopsy ultrasound was performed.

COMPLICATIONS:
Small amount of perinephric hemorrhage adjacent to the left kidney
on completion of biopsy.

SIR level A: No therapy, no consequence.
FINDINGS: The left kidney was more accessible for biopsy compared to the right
by imaging. Solid core biopsy samples were obtained. Post biopsy
imaging shows a small amount of adjacent perinephric hemorrhage upon
completion of the procedure. The patient will be observed on
bedrest.
IMPRESSION: Ultrasound-guided core biopsy performed of the left kidney at the
level of lower pole cortex.

## 2017-05-01 ENCOUNTER — Other Ambulatory Visit: Payer: Self-pay | Admitting: Infectious Diseases

## 2017-05-01 DIAGNOSIS — B20 Human immunodeficiency virus [HIV] disease: Secondary | ICD-10-CM

## 2017-05-05 ENCOUNTER — Ambulatory Visit: Payer: Self-pay | Admitting: Family Medicine

## 2017-05-19 ENCOUNTER — Encounter: Payer: Self-pay | Admitting: Cardiovascular Disease

## 2017-05-19 ENCOUNTER — Ambulatory Visit (INDEPENDENT_AMBULATORY_CARE_PROVIDER_SITE_OTHER): Payer: Medicaid Other | Admitting: Cardiovascular Disease

## 2017-05-19 VITALS — BP 134/88 | HR 72 | Ht 70.0 in | Wt 211.4 lb

## 2017-05-19 DIAGNOSIS — I130 Hypertensive heart and chronic kidney disease with heart failure and stage 1 through stage 4 chronic kidney disease, or unspecified chronic kidney disease: Secondary | ICD-10-CM

## 2017-05-19 NOTE — Progress Notes (Signed)
Cardiology Office Note Date:  05/21/2017   ID:  Albert Eaton, DOB 09-05-1966, MRN [id number]  PCP:  [name], FNP  Cardiologist:  [name], MD    Chief Complaint  Patient presents with  . Follow-up     History of Present Illness: Albert Eaton is a 51 y.o. male who presents for Follow-up evaluation. The patient initially presented in December 2017 with hypertensive emergency and elevated troponin after transitioning to [place]. An echocardiogram demonstrated preserved LV function. The patient was managed medically and presents today for follow-up evaluation. He has been compliant with his medications. Blood pressure has been better controlled. He is on a fairly complex regimen.  The patient is here alone today. He denies any recent chest pain or pressure. He denies shortness of he has occasional leg swelling worse at the end of the day. No orthopnea or PND. Primary complaint is generalized fatigue.  He complains of fatigue today. Thinks it is medication-related. Recently started gabapentin about one month ago.   Past Medical History:  Diagnosis Date  . Chronic diastolic CHF (congestive heart failure) ([name]) 01/06/2017   Echo 7/16 [name] in [name], [name]) Mild AI, mild LAE, mild concentric LVH, EF 55, normal wall motion, mild to moderate MR, mild PI, RVSP 55 // Echo 10/09/16 ([facility]):  Moderate LVH, grade 2 diastolic dysfunction, mild MR, moderate LAE   . CKD (chronic kidney disease)   . Gout   . History of nuclear stress test    a. Nuc study 7/16: no scar or ischemia, EF 42 // b. Nuc study 12/17: EF 48, ?small apical ischemia, Low Risk  . HIV (human immunodeficiency virus infection) ([name])   . HLD (hyperlipidemia)   . Hypertensive heart disease with CHF (congestive heart failure) ([name]) 10/09/2016  . OSA (obstructive sleep apnea)     Past Surgical History:  Procedure Laterality Date  . CHOLECYSTECTOMY    . HERNIA REPAIR     As baby     Current Outpatient Prescriptions  Medication Sig Dispense Refill  . abacavir (ZIAGEN) 300 MG tablet TAKE 1 TABLET BY MOUTH DAILY 30 tablet 3  . allopurinol (ZYLOPRIM) 100 MG tablet Take 1 tablet (100 mg total) by mouth 2 (two) times daily. 60 tablet 5  . amLODipine (NORVASC) 10 MG tablet Take 1 tablet (10 mg total) by mouth daily. 30 tablet 1  . atorvastatin (LIPITOR) 40 MG tablet Take 1 tablet (40 mg total) by mouth daily. 30 tablet 1  . carvedilol (COREG) 25 MG tablet Take 1 tablet (25 mg total) by mouth 2 (two) times daily with a meal. 60 tablet 11  . cloNIDine (CATAPRES) 0.1 MG tablet Take 1 tablet (0.1 mg total) by mouth 2 (two) times daily. 60 tablet 11  . famotidine (PEPCID) 20 MG tablet Take 1 tablet (20 mg total) by mouth 2 (two) times daily. 30 tablet 11  . furosemide (LASIX) 20 MG tablet Take 60 mg by mouth 2 (two) times daily.    Marland Kitchen gabapentin (NEURONTIN) 300 MG capsule Take 1 capsule (300 mg total) by mouth 3 (three) times daily. 90 capsule 3  . hydrALAZINE (APRESOLINE) 100 MG tablet Take 1 tablet (100 mg total) by mouth 3 (three) times daily. 90 tablet 11  . ISENTRESS 400 MG tablet TAKE 1 TABLET BY MOUTH TWICE DAILY 60 tablet 3  . lamiVUDine (EPIVIR) 150 MG tablet TAKE 1 TABLET BY MOUTH DAILY 30 tablet 3  . levocetirizine (XYZAL) 5 MG tablet Take 1 tablet (  5 mg total) by mouth every evening. 30 tablet 1  . miconazole (MICRO GUARD) 2 % powder Apply 1 application topically daily as needed for itching.    . ondansetron (ZOFRAN ODT) 4 MG disintegrating tablet Take 1 tablet (4 mg total) by mouth every 8 (eight) hours as needed for nausea or vomiting. 20 tablet 0  . potassium chloride SA (K-DUR,KLOR-CON) 20 MEQ tablet Take 30 mEq by mouth daily.    Marland Kitchen spironolactone (ALDACTONE) 25 MG tablet Take 25 mg by mouth daily.    Marland Kitchen zinc gluconate 50 MG tablet Take 100 mg by mouth daily.     No current facility-administered medications for this visit.     Allergies:   Lisinopril   Social  History:  The patient  reports that he quit smoking about 18 years ago. He quit smokeless tobacco use about 17 years ago. He reports that he drinks about 8.4 oz of alcohol per week . He reports that he does not use drugs.   Family History:  The patient's  family history includes Heart attack in his maternal grandmother; Heart failure in his mother; Hypertension in his brother and sister.    ROS:  Please see the history of present illness.  Otherwise, review of systems is positive for Chills, leg swelling, rash, excessive fatigue, irregular heartbeats.  All other systems are reviewed and negative.    PHYSICAL EXAM: VS:  BP 134/88   Pulse 72   Ht 5' 10"  (1.778 m)   Wt 211 lb 6.4 oz (95.9 kg)   BMI 30.33 kg/m  , BMI Body mass index is 30.33 kg/m. GEN: Well nourished, well developed, in no acute distress  HEENT: normal  Neck: no JVD, no masses. No carotid bruits Cardiac: RRR without murmur or gallop                Respiratory:  clear to auscultation bilaterally, normal work of breathing GI: soft, nontender, nondistended, + BS MS: no deformity or atrophy  Ext: no pretibial edema, pedal pulses 2+= bilaterally Skin: warm and dry, no rash Neuro:  Strength and sensation are intact Psych: euthymic mood, full affect  EKG:  EKG is not ordered today.  Recent Labs: 10/09/2016: B Natriuretic Peptide 454.6 01/06/2017: Magnesium 2.2; NT-Pro BNP 1,501 03/18/2017: Hemoglobin 13.9; Platelets 271 03/30/2017: ALT 11; BUN 37; Creat 2.97; Potassium 3.4; Sodium 138   Lipid Panel     Component Value Date/Time   CHOL 136 10/15/2016 1011   TRIG 94 10/15/2016 1011   HDL 49 10/15/2016 1011   CHOLHDL 2.8 10/15/2016 1011   VLDL 19 10/15/2016 1011   LDLCALC 68 10/15/2016 1011      Wt Readings from Last 3 Encounters:  05/19/17 211 lb 6.4 oz (95.9 kg)  03/30/17 209 lb (94.8 kg)  03/20/17 209 lb (94.8 kg)     Cardiac Studies Reviewed: 2D Echo 10-09-2016: Left ventricle:  The cavity size was mildly  dilated. Wall thickness was increased in a pattern of moderate LVH. Features are consistent with a pseudonormal left ventricular filling pattern, with concomitant abnormal relaxation and increased filling pressure (grade 2 diastolic dysfunction).  ------------------------------------------------------------------- Aortic valve:   Mildly thickened leaflets.  Doppler:  There was no significant regurgitation.  ------------------------------------------------------------------- Aorta:  Ascending aorta is mildly dilated at 40 mm.  ------------------------------------------------------------------- Mitral valve:   Mildly thickened leaflets .  Doppler:  There was mild regurgitation.  ------------------------------------------------------------------- Left atrium:  The atrium was moderately dilated.  ------------------------------------------------------------------- Right ventricle:  The cavity size  was normal. Wall thickness was normal. Systolic function was normal.  ------------------------------------------------------------------- Tricuspid valve:   Structurally normal valve.   Leaflet separation was normal.  Doppler:  Transvalvular velocity was within the normal range. There was mild regurgitation.  ------------------------------------------------------------------- Right atrium:  The atrium was normal in size.  ------------------------------------------------------------------- Pericardium:  There was no pericardial effusion.  ------------------------------------------------------------------- Systemic veins: Inferior vena cava: The vessel was normal in size. The respirophasic diameter changes were in the normal range (>= 50%), consistent with normal central venous pressure.  ------------------------------------------------------------------- Post procedure conclusions Ascending Aorta:  - Ascending aorta is mildly dilated at 40 mm.   ASSESSMENT AND PLAN: 1.   Chronic diastolic heart failure, NYHA functional class II symptoms: Comorbid conditions include severe hypertension, LVH, and chronic kidney disease stage IV. No medication changes recommended today. We discussed sodium restriction. Stressed the importance of medication compliance and he seems to be doing a good job with this. Most recent echo reviewed as above.   2. Malignant hypertension with heart failure and CKD stage IV: Blood pressure is controlled on a combination of amlodipine, carvedilol, clonidine, hydralazine, isosorbide, and Aldactone.  3. Chronic kidney disease stage IV: The patient is trying to establish with nephrology. Most recent labs are reviewed.  Current medicines are reviewed with the patient today.  The patient does not have concerns regarding medicines.  Labs/ tests ordered today include:   Orders Placed This Encounter  Procedures  . ECHOCARDIOGRAM COMPLETE    Disposition:   FU 6 months with an echo at that time.   Deatra James, MD  05/21/2017 5:24 AM    [facility] [address], [place], [place]  [address] Phone: [phone]; Fax: [phone]

## 2017-05-19 NOTE — Patient Instructions (Addendum)
Medication Instructions:  Your physician recommends that you continue on your current medications as directed. Please refer to the Current Medication list given to you today.  Labwork: No new orders.   Testing/Procedures: Your physician has requested that you have an echocardiogram in 6 MONTHS. Echocardiography is a painless test that uses sound waves to create images of your heart. It provides your doctor with information about the size and shape of your heart and how well your heart's chambers and valves are working. This procedure takes approximately one hour. There are no restrictions for this procedure.  Follow-Up: Your physician wants you to follow-up in: 6 MONTHS with Richardson Dopp PA-C/Dr [name]. You will receive a reminder letter in the mail two months in advance. If you don't receive a letter, please call our office to schedule the follow-up appointment.   Any Other Special Instructions Will Be Listed Below (If Applicable).     If you need a refill on your cardiac medications before your next appointment, please call your pharmacy.

## 2017-05-25 ENCOUNTER — Other Ambulatory Visit: Payer: Self-pay

## 2017-05-25 DIAGNOSIS — Z113 Encounter for screening for infections with a predominantly sexual mode of transmission: Secondary | ICD-10-CM

## 2017-05-25 DIAGNOSIS — B20 Human immunodeficiency virus [HIV] disease: Secondary | ICD-10-CM

## 2017-05-25 DIAGNOSIS — Z79899 Other long term (current) drug therapy: Secondary | ICD-10-CM

## 2017-05-25 LAB — CBC
HCT: 36 % — ABNORMAL LOW (ref 38.5–50.0)
Hemoglobin: 12.8 g/dL — ABNORMAL LOW (ref 13.2–17.1)
MCH: 31.8 pg (ref 27.0–33.0)
MCHC: 35.6 g/dL (ref 32.0–36.0)
MCV: 89.3 fL (ref 80.0–100.0)
MPV: 10.5 fL (ref 7.5–12.5)
PLATELETS: 235 10*3/uL (ref 140–400)
RBC: 4.03 MIL/uL — ABNORMAL LOW (ref 4.20–5.80)
RDW: 14.4 % (ref 11.0–15.0)
WBC: 6.3 10*3/uL (ref 3.8–10.8)

## 2017-05-25 LAB — COMPREHENSIVE METABOLIC PANEL
ALT: 8 U/L — ABNORMAL LOW (ref 9–46)
AST: 9 U/L — ABNORMAL LOW (ref 10–35)
Albumin: 3.4 g/dL — ABNORMAL LOW (ref 3.6–5.1)
Alkaline Phosphatase: 56 U/L (ref 40–115)
BUN: 40 mg/dL — ABNORMAL HIGH (ref 7–25)
CO2: 23 mmol/L (ref 20–31)
Calcium: 8.7 mg/dL (ref 8.6–10.3)
Chloride: 103 mmol/L (ref 98–110)
Creat: 3.58 mg/dL — ABNORMAL HIGH (ref 0.70–1.33)
Glucose, Bld: 149 mg/dL — ABNORMAL HIGH (ref 65–99)
Potassium: 3.3 mmol/L — ABNORMAL LOW (ref 3.5–5.3)
Sodium: 137 mmol/L (ref 135–146)
Total Bilirubin: 0.4 mg/dL (ref 0.2–1.2)
Total Protein: 5.8 g/dL — ABNORMAL LOW (ref 6.1–8.1)

## 2017-05-25 LAB — LIPID PANEL
CHOL/HDL RATIO: 4.4 ratio (ref <5.0)
Cholesterol: 194 mg/dL (ref <200)
HDL: 44 mg/dL (ref >40)
LDL CALC: 121 mg/dL — AB (ref <100)
Triglycerides: 143 mg/dL (ref <150)
VLDL: 29 mg/dL (ref <30)

## 2017-05-26 LAB — T-HELPER CELL (CD4) - (RCID CLINIC ONLY)
CD4 T CELL HELPER: 28 % — AB (ref 33–55)
CD4 T Cell Abs: 690 /uL (ref 400–2700)

## 2017-05-26 LAB — URINE CYTOLOGY ANCILLARY ONLY
Chlamydia: NEGATIVE
Neisseria Gonorrhea: NEGATIVE

## 2017-05-26 LAB — RPR

## 2017-05-27 ENCOUNTER — Encounter: Payer: Self-pay | Admitting: Infectious Diseases

## 2017-05-28 LAB — HIV-1 RNA QUANT-NO REFLEX-BLD
HIV 1 RNA QUANT: DETECTED {copies}/mL — AB
HIV-1 RNA Quant, Log: <1.3 Log copies/mL — AB

## 2017-05-31 ENCOUNTER — Emergency Department (HOSPITAL_COMMUNITY)
Admission: EM | Admit: 2017-05-31 | Discharge: 2017-06-01 | Disposition: A | Discharge status: Home / Self Care | Payer: Medicaid Other | Attending: Emergency Medicine | Admitting: Emergency Medicine

## 2017-05-31 ENCOUNTER — Emergency Department (HOSPITAL_COMMUNITY): Payer: Medicaid Other

## 2017-05-31 ENCOUNTER — Encounter (HOSPITAL_COMMUNITY): Payer: Self-pay | Admitting: Emergency Medicine

## 2017-05-31 DIAGNOSIS — N184 Chronic kidney disease, stage 4 (severe): Secondary | ICD-10-CM | POA: Diagnosis not present

## 2017-05-31 DIAGNOSIS — N390 Urinary tract infection, site not specified: Secondary | ICD-10-CM | POA: Insufficient documentation

## 2017-05-31 DIAGNOSIS — R509 Fever, unspecified: Secondary | ICD-10-CM | POA: Diagnosis not present

## 2017-05-31 DIAGNOSIS — Z87891 Personal history of nicotine dependence: Secondary | ICD-10-CM | POA: Diagnosis not present

## 2017-05-31 DIAGNOSIS — Z79899 Other long term (current) drug therapy: Secondary | ICD-10-CM | POA: Diagnosis not present

## 2017-05-31 DIAGNOSIS — I5032 Chronic diastolic (congestive) heart failure: Secondary | ICD-10-CM | POA: Diagnosis not present

## 2017-05-31 DIAGNOSIS — R109 Unspecified abdominal pain: Secondary | ICD-10-CM | POA: Diagnosis present

## 2017-05-31 DIAGNOSIS — I130 Hypertensive heart and chronic kidney disease with heart failure and stage 1 through stage 4 chronic kidney disease, or unspecified chronic kidney disease: Secondary | ICD-10-CM | POA: Diagnosis not present

## 2017-05-31 LAB — CBC WITH DIFFERENTIAL/PLATELET
BASOS ABS: 0 10*3/uL (ref 0.0–0.1)
BASOS PCT: 0 %
EOS ABS: 0.1 10*3/uL (ref 0.0–0.7)
Eosinophils Relative: 1 %
HEMATOCRIT: 35.4 % — AB (ref 39.0–52.0)
HEMOGLOBIN: 12.3 g/dL — AB (ref 13.0–17.0)
Lymphocytes Relative: 21 %
Lymphs Abs: 2.3 10*3/uL (ref 0.7–4.0)
MCH: 30.6 pg (ref 26.0–34.0)
MCHC: 34.7 g/dL (ref 30.0–36.0)
MCV: 88.1 fL (ref 78.0–100.0)
MONOS PCT: 6 %
Monocytes Absolute: 0.7 10*3/uL (ref 0.1–1.0)
NEUTROS ABS: 7.8 10*3/uL — AB (ref 1.7–7.7)
NEUTROS PCT: 72 %
Platelets: 270 10*3/uL (ref 150–400)
RBC: 4.02 MIL/uL — AB (ref 4.22–5.81)
RDW: 13.8 % (ref 11.5–15.5)
WBC: 10.9 10*3/uL — AB (ref 4.0–10.5)

## 2017-05-31 LAB — URINALYSIS, ROUTINE W REFLEX MICROSCOPIC
Bilirubin Urine: NEGATIVE
GLUCOSE, UA: NEGATIVE mg/dL
KETONES UR: NEGATIVE mg/dL
Nitrite: NEGATIVE
PH: 5.5 (ref 5.0–8.0)
Protein, ur: >300 mg/dL — AB
Specific Gravity, Urine: 1.025 (ref 1.005–1.030)

## 2017-05-31 LAB — COMPREHENSIVE METABOLIC PANEL
ALBUMIN: 3.8 g/dL (ref 3.5–5.0)
ALK PHOS: 63 U/L (ref 38–126)
ALT: 13 U/L — ABNORMAL LOW (ref 17–63)
ANION GAP: 10 (ref 5–15)
AST: 16 U/L (ref 15–41)
BILIRUBIN TOTAL: 0.8 mg/dL (ref 0.3–1.2)
BUN: 30 mg/dL — ABNORMAL HIGH (ref 6–20)
CALCIUM: 9.3 mg/dL (ref 8.9–10.3)
CO2: 23 mmol/L (ref 22–32)
Chloride: 104 mmol/L (ref 101–111)
Creatinine, Ser: 4.11 mg/dL — ABNORMAL HIGH (ref 0.61–1.24)
GFR calc non Af Amer: 15 mL/min — ABNORMAL LOW (ref >60)
GFR, EST AFRICAN AMERICAN: 18 mL/min — AB (ref >60)
GLUCOSE: 104 mg/dL — AB (ref 65–99)
POTASSIUM: 3.4 mmol/L — AB (ref 3.5–5.1)
Sodium: 137 mmol/L (ref 135–145)
TOTAL PROTEIN: 7 g/dL (ref 6.5–8.1)

## 2017-05-31 LAB — URINALYSIS, MICROSCOPIC (REFLEX): Squamous Epithelial / LPF: NONE SEEN

## 2017-05-31 LAB — I-STAT CG4 LACTIC ACID, ED
LACTIC ACID, VENOUS: 1.02 mmol/L (ref 0.5–1.9)
LACTIC ACID, VENOUS: 1.22 mmol/L (ref 0.5–1.9)

## 2017-05-31 LAB — PROTIME-INR
INR: 1.08
Prothrombin Time: 14 seconds (ref 11.4–15.2)

## 2017-05-31 IMAGING — DX DG CHEST 2V
2 series · 2 of 2 positions shown · non-contrast
Comparison: CT Abdomen and Pelvis [DATE]

CLINICAL DATA: 51-year-old male with fever and bilateral flank
pain. HIV.

EXAM:
CHEST  2 VIEW

[chest lat]
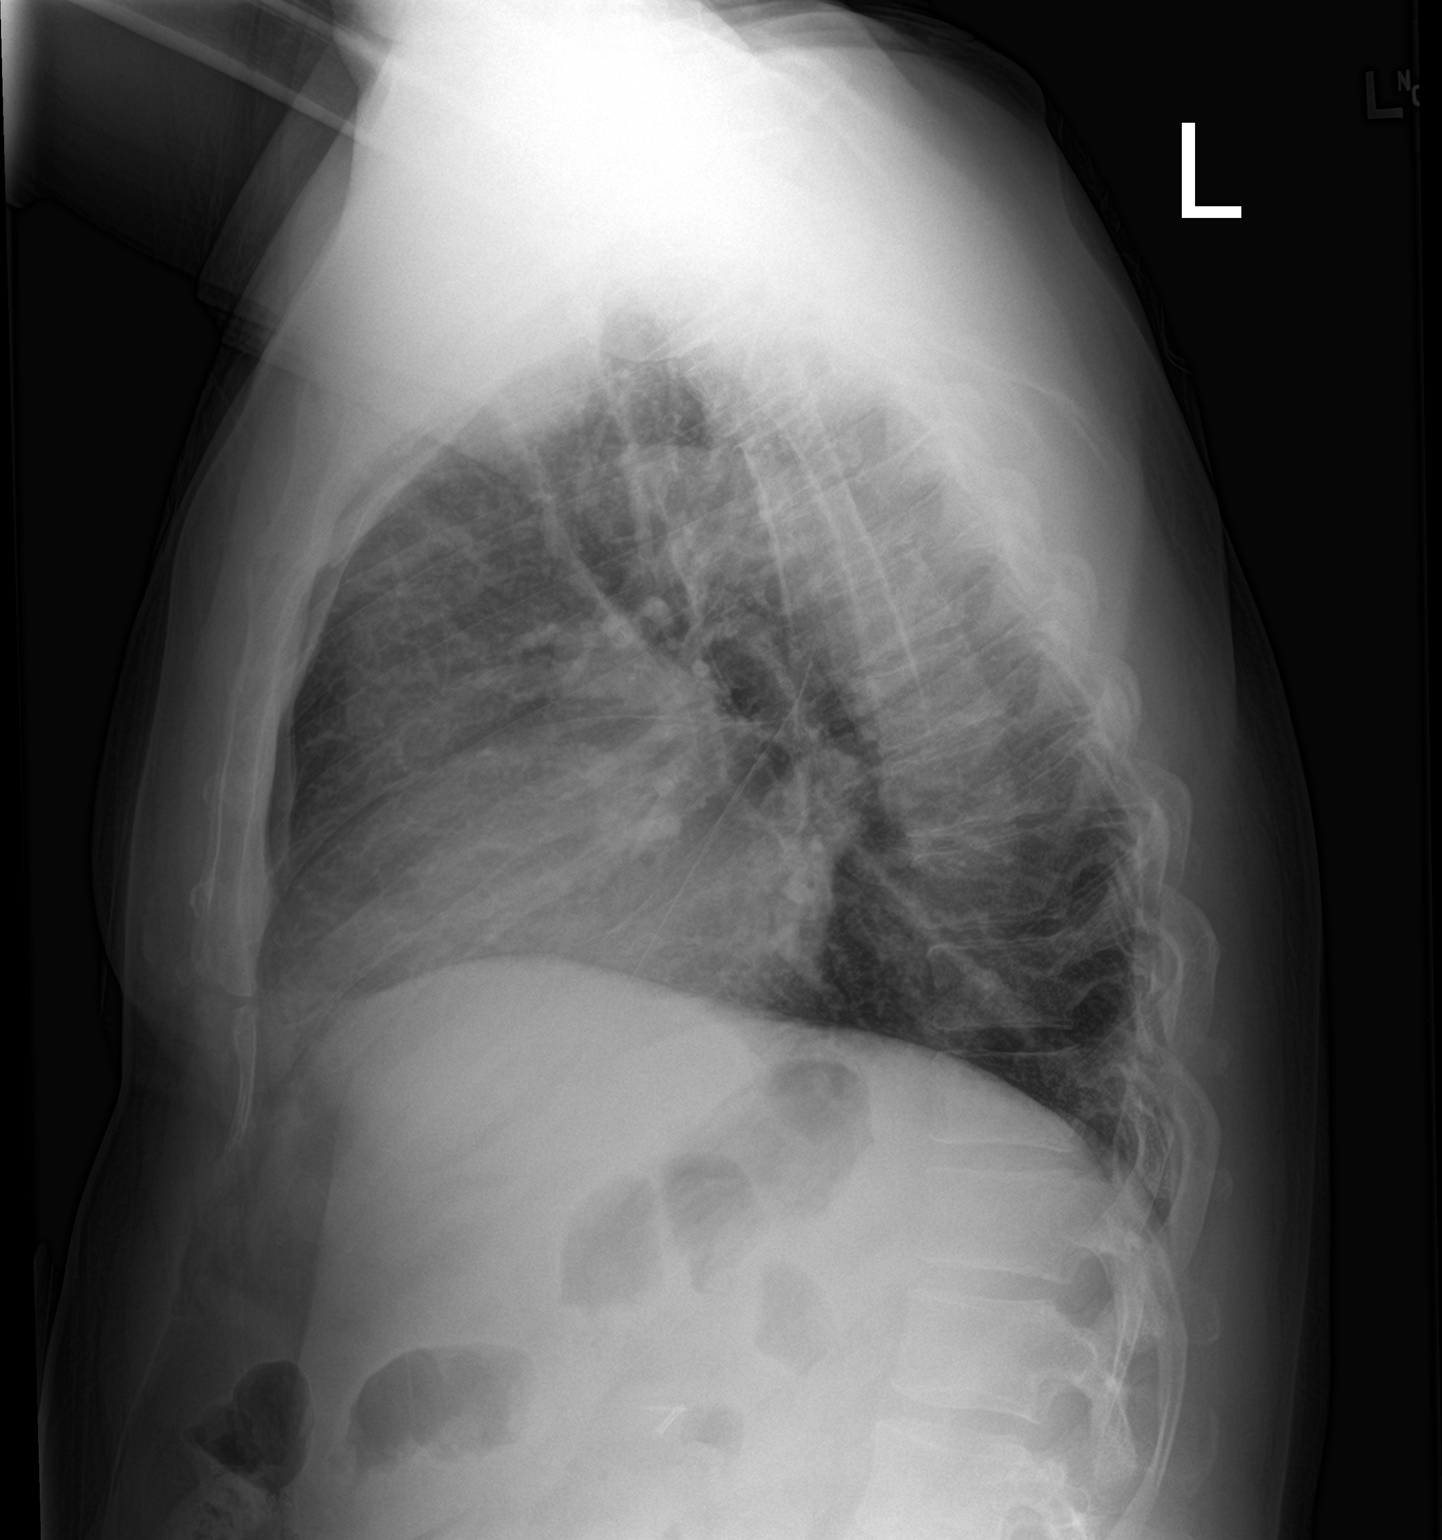

[chest pa]
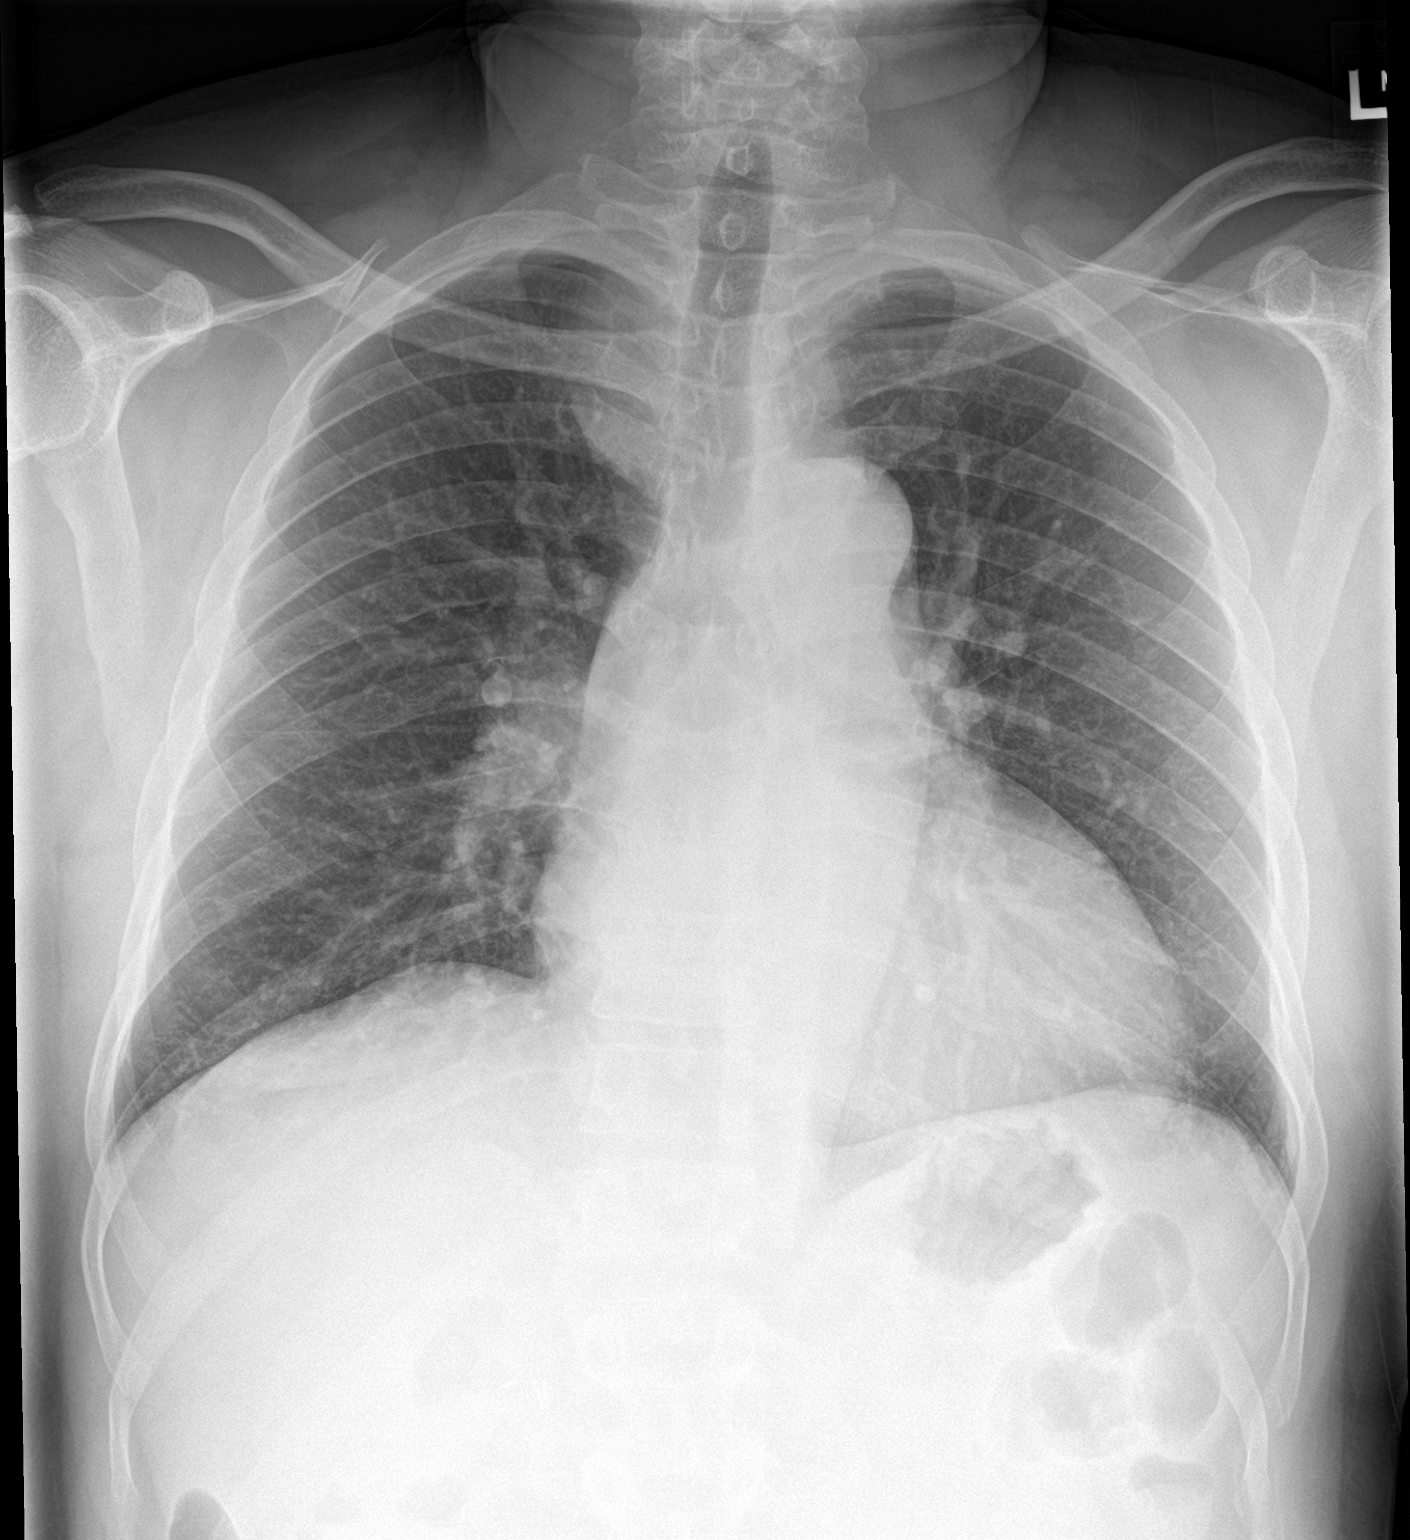

[2 of 2 positions shown; findings below may reference images not displayed]

FINDINGS: Stable cardiomegaly and mediastinal contours. Other mediastinal
contours are within normal limits. Visualized tracheal air column is
within normal limits. No pneumothorax, pulmonary edema, pleural
effusion or confluent pulmonary opacity. No acute osseous
abnormality identified. Stable cholecystectomy clips. Negative
visible bowel gas pattern.
IMPRESSION: Stable cardiomegaly. No acute cardiopulmonary abnormality.

## 2017-05-31 MED ORDER — ONDANSETRON HCL 4 MG/2ML IJ SOLN
4.0000 mg | Freq: Once | INTRAMUSCULAR | Status: AC
  Administered 2017-05-31: 4 mg via INTRAVENOUS
  Filled 2017-05-31: qty 2

## 2017-05-31 MED ORDER — OXYCODONE-ACETAMINOPHEN 5-325 MG PO TABS
1.0000 | ORAL_TABLET | Freq: Once | ORAL | Status: AC
  Administered 2017-05-31: 1 via ORAL

## 2017-05-31 MED ORDER — HYDROMORPHONE HCL 1 MG/ML IJ SOLN
1.0000 mg | Freq: Once | INTRAMUSCULAR | Status: AC
  Administered 2017-05-31: 1 mg via INTRAVENOUS
  Filled 2017-05-31: qty 1

## 2017-05-31 MED ORDER — OXYCODONE-ACETAMINOPHEN 5-325 MG PO TABS
ORAL_TABLET | ORAL | Status: AC
  Filled 2017-05-31: qty 1

## 2017-05-31 MED ORDER — SODIUM CHLORIDE 0.9 % IV SOLN
Freq: Once | INTRAVENOUS | Status: AC
  Administered 2017-05-31: via INTRAVENOUS

## 2017-05-31 NOTE — ED Notes (Signed)
Patient transported to CT 

## 2017-05-31 NOTE — ED Triage Notes (Signed)
Pt c/o 8/10 bilateral flank pain that started today, hx of stage 4 renal dz, denies any urinary symptoms at this time, pt having a fever on triage.

## 2017-06-01 ENCOUNTER — Emergency Department (HOSPITAL_COMMUNITY)
Admission: EM | Admit: 2017-06-01 | Discharge: 2017-06-01 | Disposition: A | Discharge status: Home / Self Care | Payer: Medicaid Other | Attending: Emergency Medicine | Admitting: Emergency Medicine

## 2017-06-01 ENCOUNTER — Encounter (HOSPITAL_COMMUNITY): Payer: Self-pay

## 2017-06-01 ENCOUNTER — Emergency Department (HOSPITAL_COMMUNITY): Payer: Medicaid Other

## 2017-06-01 DIAGNOSIS — R112 Nausea with vomiting, unspecified: Secondary | ICD-10-CM | POA: Insufficient documentation

## 2017-06-01 DIAGNOSIS — R509 Fever, unspecified: Secondary | ICD-10-CM | POA: Diagnosis present

## 2017-06-01 DIAGNOSIS — E785 Hyperlipidemia, unspecified: Secondary | ICD-10-CM | POA: Insufficient documentation

## 2017-06-01 DIAGNOSIS — Z87891 Personal history of nicotine dependence: Secondary | ICD-10-CM | POA: Insufficient documentation

## 2017-06-01 DIAGNOSIS — N184 Chronic kidney disease, stage 4 (severe): Secondary | ICD-10-CM | POA: Diagnosis not present

## 2017-06-01 DIAGNOSIS — I130 Hypertensive heart and chronic kidney disease with heart failure and stage 1 through stage 4 chronic kidney disease, or unspecified chronic kidney disease: Secondary | ICD-10-CM | POA: Diagnosis not present

## 2017-06-01 DIAGNOSIS — I509 Heart failure, unspecified: Secondary | ICD-10-CM | POA: Diagnosis not present

## 2017-06-01 DIAGNOSIS — N189 Chronic kidney disease, unspecified: Secondary | ICD-10-CM

## 2017-06-01 DIAGNOSIS — Z7983 Long term (current) use of bisphosphonates: Secondary | ICD-10-CM | POA: Diagnosis not present

## 2017-06-01 DIAGNOSIS — Z79899 Other long term (current) drug therapy: Secondary | ICD-10-CM | POA: Insufficient documentation

## 2017-06-01 DIAGNOSIS — B20 Human immunodeficiency virus [HIV] disease: Secondary | ICD-10-CM | POA: Diagnosis not present

## 2017-06-01 LAB — COMPREHENSIVE METABOLIC PANEL
ALT: 12 U/L — AB (ref 17–63)
AST: 14 U/L — AB (ref 15–41)
Albumin: 3.6 g/dL (ref 3.5–5.0)
Alkaline Phosphatase: 55 U/L (ref 38–126)
Anion gap: 14 (ref 5–15)
BUN: 35 mg/dL — ABNORMAL HIGH (ref 6–20)
CHLORIDE: 103 mmol/L (ref 101–111)
CO2: 21 mmol/L — AB (ref 22–32)
CREATININE: 4.67 mg/dL — AB (ref 0.61–1.24)
Calcium: 9.1 mg/dL (ref 8.9–10.3)
GFR calc non Af Amer: 13 mL/min — ABNORMAL LOW (ref >60)
GFR, EST AFRICAN AMERICAN: 15 mL/min — AB (ref >60)
Glucose, Bld: 105 mg/dL — ABNORMAL HIGH (ref 65–99)
POTASSIUM: 3.4 mmol/L — AB (ref 3.5–5.1)
SODIUM: 138 mmol/L (ref 135–145)
Total Bilirubin: 0.9 mg/dL (ref 0.3–1.2)
Total Protein: 6.9 g/dL (ref 6.5–8.1)

## 2017-06-01 LAB — I-STAT CHEM 8, ED
BUN: 36 mg/dL — AB (ref 6–20)
CALCIUM ION: 1.18 mmol/L (ref 1.15–1.40)
CREATININE: 4.8 mg/dL — AB (ref 0.61–1.24)
Chloride: 102 mmol/L (ref 101–111)
GLUCOSE: 98 mg/dL (ref 65–99)
HEMATOCRIT: 34 % — AB (ref 39.0–52.0)
Hemoglobin: 11.6 g/dL — ABNORMAL LOW (ref 13.0–17.0)
POTASSIUM: 3.2 mmol/L — AB (ref 3.5–5.1)
SODIUM: 140 mmol/L (ref 135–145)
TCO2: 24 mmol/L (ref 0–100)

## 2017-06-01 LAB — URINALYSIS, ROUTINE W REFLEX MICROSCOPIC
Bilirubin Urine: NEGATIVE
Glucose, UA: NEGATIVE mg/dL
KETONES UR: NEGATIVE mg/dL
Nitrite: NEGATIVE
Specific Gravity, Urine: 1.016 (ref 1.005–1.030)
pH: 5 (ref 5.0–8.0)

## 2017-06-01 LAB — CBC WITH DIFFERENTIAL/PLATELET
Basophils Absolute: 0 10*3/uL (ref 0.0–0.1)
Basophils Relative: 0 %
EOS ABS: 0 10*3/uL (ref 0.0–0.7)
Eosinophils Relative: 0 %
HEMATOCRIT: 33.7 % — AB (ref 39.0–52.0)
HEMOGLOBIN: 11.7 g/dL — AB (ref 13.0–17.0)
LYMPHS ABS: 1.8 10*3/uL (ref 0.7–4.0)
LYMPHS PCT: 14 %
MCH: 30.5 pg (ref 26.0–34.0)
MCHC: 34.7 g/dL (ref 30.0–36.0)
MCV: 87.8 fL (ref 78.0–100.0)
MONOS PCT: 10 %
Monocytes Absolute: 1.3 10*3/uL — ABNORMAL HIGH (ref 0.1–1.0)
NEUTROS PCT: 76 %
Neutro Abs: 9.6 10*3/uL — ABNORMAL HIGH (ref 1.7–7.7)
Platelets: 238 10*3/uL (ref 150–400)
RBC: 3.84 MIL/uL — AB (ref 4.22–5.81)
RDW: 13.6 % (ref 11.5–15.5)
WBC: 12.7 10*3/uL — ABNORMAL HIGH (ref 4.0–10.5)

## 2017-06-01 LAB — I-STAT CG4 LACTIC ACID, ED: Lactic Acid, Venous: 0.88 mmol/L (ref 0.5–1.9)

## 2017-06-01 IMAGING — CT CT RENAL STONE PROTOCOL
2 of 4 series · 16 of 46 positions shown, 18 images · non-contrast
Comparison: CT of the abdomen and pelvis performed [DATE], and
renal ultrasound performed [DATE]

CLINICAL DATA: Acute onset of right flank pain.  Initial encounter.

EXAM:
CT ABDOMEN AND PELVIS WITHOUT CONTRAST
TECHNIQUE: Multidetector CT imaging of the abdomen and pelvis was performed
following the standard protocol without IV contrast.

[Series 3: ap without · axial · non-contrast · 0.71mm/px · z∈[+960,+1370]mm · 13 of 92 slices shown, 15 images]
[im 5/92  soft-tissue]
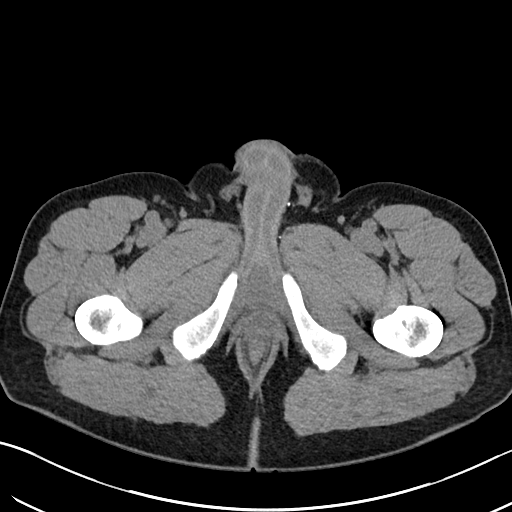
[im 5/92  bone]
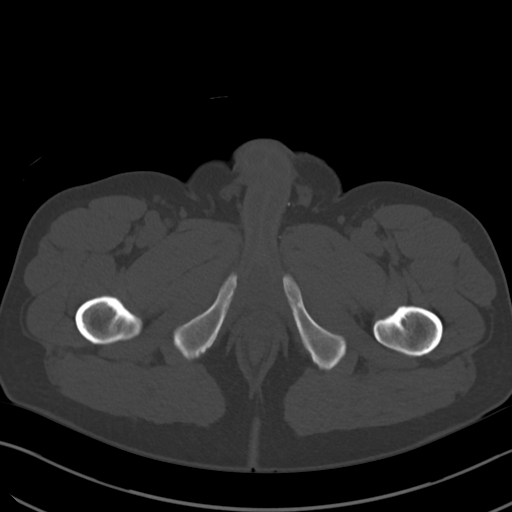
[im 15/92  soft-tissue]
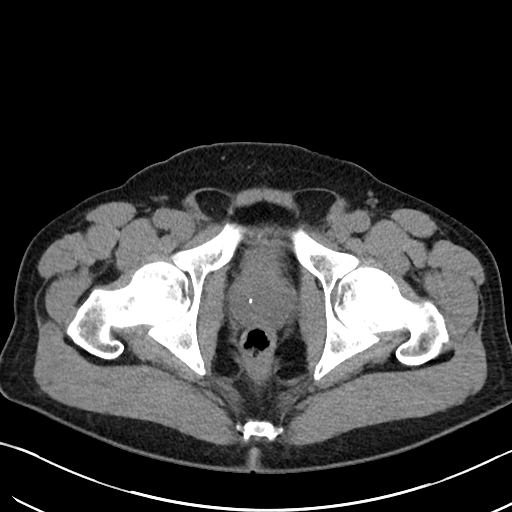
[im 20/92  soft-tissue]
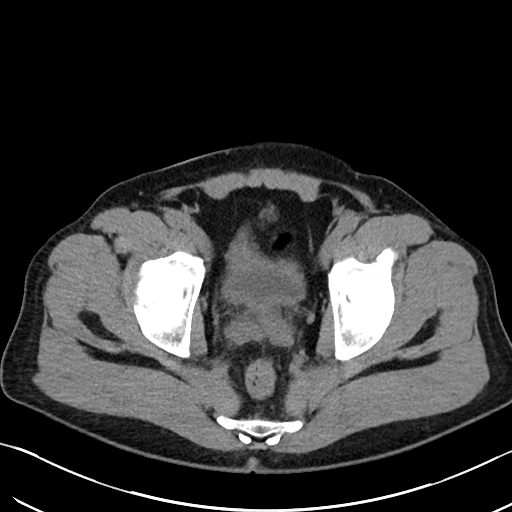
[im 24/92  soft-tissue]
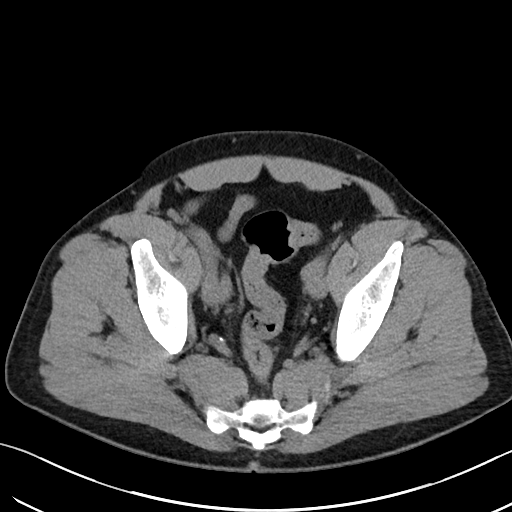
[im 34/92  soft-tissue]
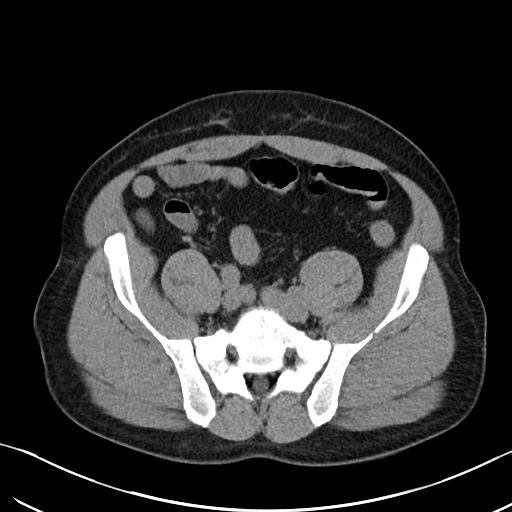
[im 39/92  soft-tissue]
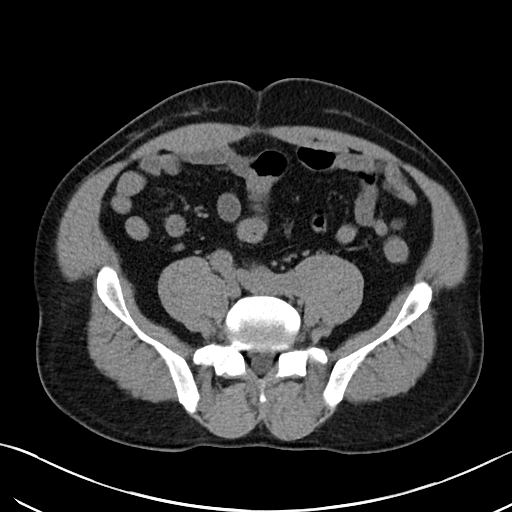
[im 48/92  soft-tissue]
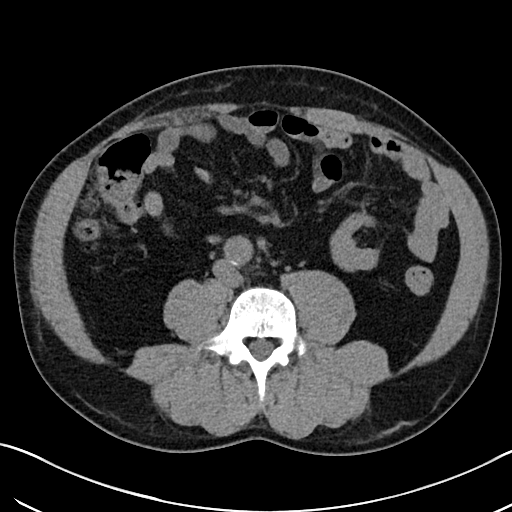
[im 53/92  soft-tissue]
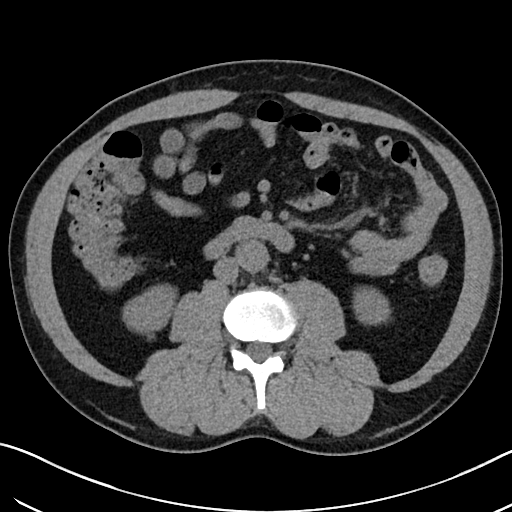
[im 58/92  soft-tissue]
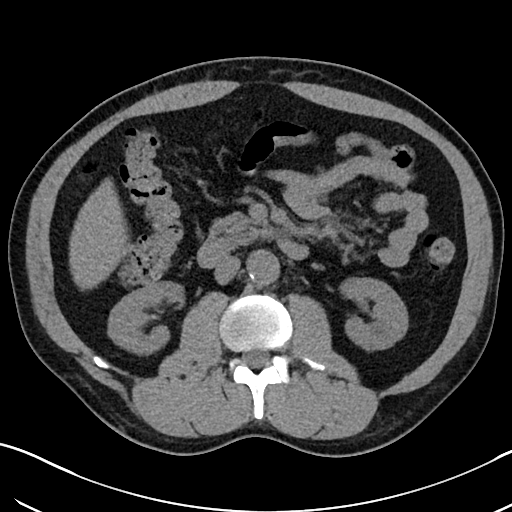
[im 58/92  bone]
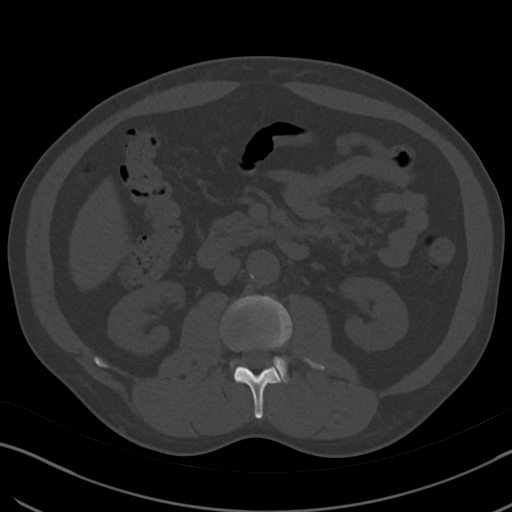
[im 68/92  soft-tissue]
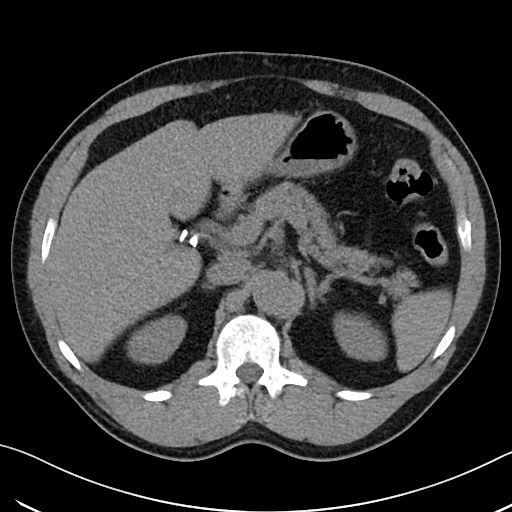
[im 72/92  soft-tissue]
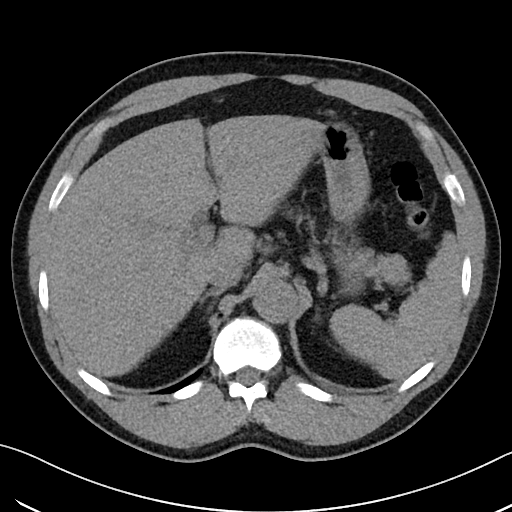
[im 77/92  soft-tissue]
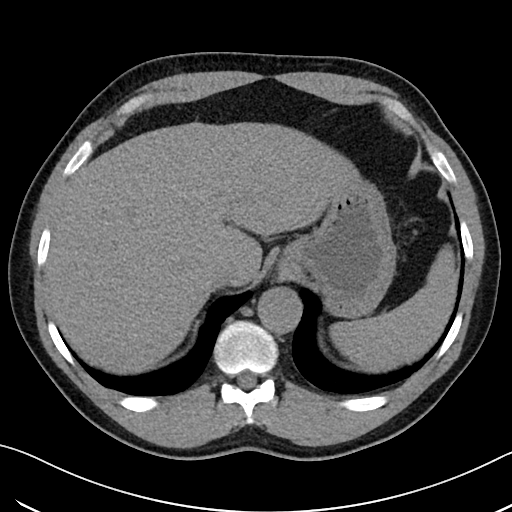
[im 87/92  soft-tissue]
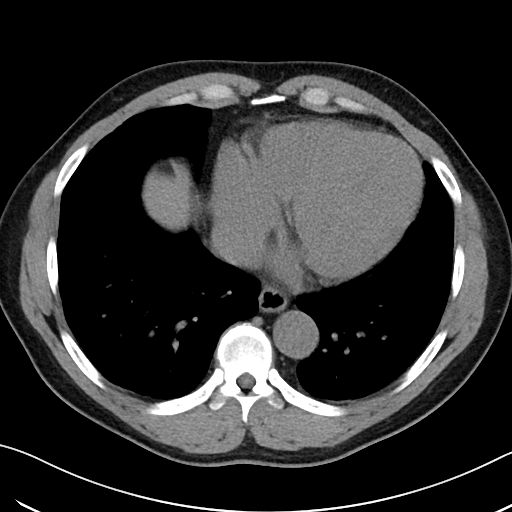

[Series 6: cor · coronal · 0.75mm/px · 3 of 101 slices shown]
[im 34/101  soft-tissue]
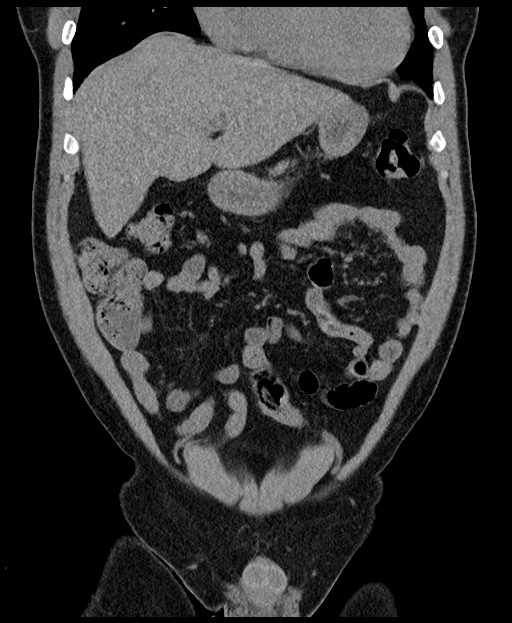
[im 45/101  soft-tissue]
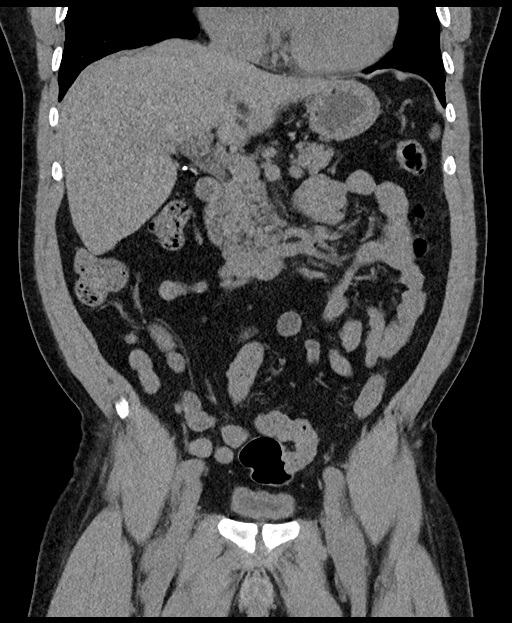
[im 56/101  soft-tissue]
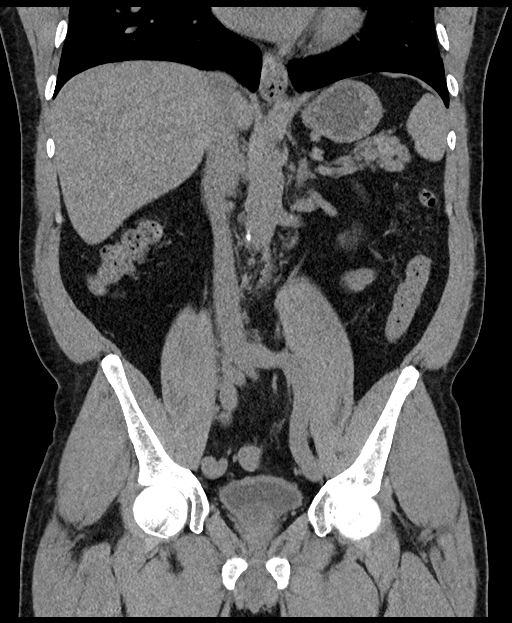

[16 of 46 positions shown; findings below may reference images not displayed]

FINDINGS: Lower chest: The visualized lung bases are grossly clear. The heart
is borderline enlarged.

Hepatobiliary: The liver is unremarkable in appearance. The patient
is status post cholecystectomy, with clips noted at the gallbladder
fossa. The common bile duct remains normal in caliber.

Pancreas: The pancreas is within normal limits.

Spleen: The spleen is unremarkable in appearance.

Adrenals/Urinary Tract: The adrenal glands are unremarkable in
appearance. The kidneys are within normal limits. There is no
evidence of hydronephrosis. No renal or ureteral stones are
identified. No perinephric stranding is seen.

Stomach/Bowel: The stomach is unremarkable in appearance. The small
bowel is within normal limits. The appendix is normal in caliber,
without evidence of appendicitis. The colon is unremarkable in
appearance.

Vascular/Lymphatic: Scattered calcification is seen along the
abdominal aorta and its branches. The abdominal aorta is otherwise
grossly unremarkable. The inferior vena cava is grossly
unremarkable. No retroperitoneal lymphadenopathy is seen. No pelvic
sidewall lymphadenopathy is identified.

Reproductive: The bladder is mildly distended and grossly
unremarkable. The prostate remains normal in size.

Other: No additional soft tissue abnormalities are seen.

Musculoskeletal: No acute osseous abnormalities are identified. The
visualized musculature is unremarkable in appearance.
IMPRESSION: 1. No acute abnormality seen within the abdomen or pelvis.
2. Scattered aortic atherosclerosis.
3. Borderline cardiomegaly.

## 2017-06-01 MED ORDER — ACETAMINOPHEN 500 MG PO TABS
1000.0000 mg | ORAL_TABLET | Freq: Once | ORAL | Status: AC
  Administered 2017-06-01: 1000 mg via ORAL
  Filled 2017-06-01: qty 2

## 2017-06-01 MED ORDER — ONDANSETRON 4 MG PO TBDP: ORAL_TABLET | ORAL | 0 refills | Status: DC

## 2017-06-01 MED ORDER — ACETAMINOPHEN 325 MG PO TABS
650.0000 mg | ORAL_TABLET | Freq: Once | ORAL | Status: AC | PRN
  Administered 2017-06-01: 650 mg via ORAL

## 2017-06-01 MED ORDER — ACETAMINOPHEN 325 MG PO TABS
ORAL_TABLET | ORAL | Status: AC
  Filled 2017-06-01: qty 2

## 2017-06-01 MED ORDER — ONDANSETRON HCL 4 MG/2ML IJ SOLN
4.0000 mg | Freq: Once | INTRAMUSCULAR | Status: AC
  Administered 2017-06-01: 4 mg via INTRAVENOUS
  Filled 2017-06-01: qty 2

## 2017-06-01 MED ORDER — DEXTROSE 5 % IV SOLN
2.0000 g | Freq: Once | INTRAVENOUS | Status: AC
  Administered 2017-06-01: 2 g via INTRAVENOUS
  Filled 2017-06-01: qty 2

## 2017-06-01 MED ORDER — CEPHALEXIN 500 MG PO CAPS: 500.0000 mg | ORAL_CAPSULE | Freq: Two times a day (BID) | ORAL | 0 refills | Status: DC

## 2017-06-01 MED ORDER — TRAMADOL HCL 50 MG PO TABS: 50.0000 mg | ORAL_TABLET | Freq: Four times a day (QID) | ORAL | 0 refills | Status: DC | PRN

## 2017-06-01 MED ORDER — SODIUM CHLORIDE 0.9 % IV BOLUS (SEPSIS)
1000.0000 mL | Freq: Once | INTRAVENOUS | Status: AC
  Administered 2017-06-01: 1000 mL via INTRAVENOUS

## 2017-06-01 NOTE — ED Provider Notes (Signed)
[facility] DEPT Provider Note   CSN: [id number] Arrival date & time: 05/31/17  2053     History   Chief Complaint Chief Complaint  Patient presents with  . Flank Pain    bilateral    HPI Albert Eaton is a 51 y.o. male.  Patient presents to the emergency department with complaints of flank pain. He reports that upon awakening this morning he noticed urinary frequency. He reports that he was urinating every 15 minutes or so, then through the day the urine frequency resolved. He started having right flank pain followed by left flank pain. He complains tonight of continuous bilateral flank pain but the right is still much worse than the left. He has no history of kidney stones. He has not noticed any hematuria. He does report nausea and decreased oral intake, however, no vomiting, diarrhea, constipation.      Past Medical History:  Diagnosis Date  . Chronic diastolic CHF (congestive heart failure) ([place]) 01/06/2017   Echo 7/16 [facility] in [place], [name]) Mild AI, mild LAE, mild concentric LVH, EF 55, normal wall motion, mild to moderate MR, mild PI, RVSP 55 // Echo 10/09/16 ([facility]):  Moderate LVH, grade 2 diastolic dysfunction, mild MR, moderate LAE   . CKD (chronic kidney disease)   . Gout   . History of nuclear stress test    a. Nuc study 7/16: no scar or ischemia, EF 42 // b. Nuc study 12/17: EF 48, ?small apical ischemia, Low Risk  . HIV (human immunodeficiency virus infection) ([employer])   . HLD (hyperlipidemia)   . Hypertensive heart disease with CHF (congestive heart failure) ([place]) 10/09/2016  . OSA (obstructive sleep apnea)     Patient Active Problem List   Diagnosis Date Noted  . Arthritis, gouty 01/27/2017  . Chronic diastolic CHF (congestive heart failure) ([place]) 01/06/2017  . OSA (obstructive sleep apnea) 01/06/2017  . Abnormal electrocardiogram (ECG) (EKG) 10/31/2016  . Chest pain 10/09/2016  . HIV disease ([place]) 10/09/2016  . CKD stage 4 secondary to  hypertension ([place]) 10/09/2016  . Hypertensive heart disease with CHF (congestive heart failure) ([place]) 10/09/2016    Past Surgical History:  Procedure Laterality Date  . CHOLECYSTECTOMY    . HERNIA REPAIR     As baby       Home Medications    Prior to Admission medications   Medication Sig Start Date End Date Taking? Authorizing Provider  abacavir (ZIAGEN) 300 MG tablet TAKE 1 TABLET BY MOUTH DAILY 05/04/17   Campbell Riches, MD  allopurinol (ZYLOPRIM) 100 MG tablet Take 1 tablet (100 mg total) by mouth 2 (two) times daily. 01/02/17   [name], FNP  amLODipine (NORVASC) 10 MG tablet Take 1 tablet (10 mg total) by mouth daily. 10/31/16   [name], FNP  atorvastatin (LIPITOR) 40 MG tablet Take 1 tablet (40 mg total) by mouth daily. 10/31/16   [name], FNP  carvedilol (COREG) 25 MG tablet Take 1 tablet (25 mg total) by mouth 2 (two) times daily with a meal. 12/09/16   [name], MD  cloNIDine (CATAPRES) 0.1 MG tablet Take 1 tablet (0.1 mg total) by mouth 2 (two) times daily. 01/06/17   Richardson Dopp T, PA-C  famotidine (PEPCID) 20 MG tablet Take 1 tablet (20 mg total) by mouth 2 (two) times daily. 01/06/17   Richardson Dopp T, PA-C  furosemide (LASIX) 20 MG tablet Take 60 mg by mouth 2 (two) times daily.    [provider]  gabapentin (NEURONTIN) 300 MG capsule Take 1 capsule (300 mg total) by mouth 3 (three) times daily. 12/25/16   [name], FNP  hydrALAZINE (APRESOLINE) 100 MG tablet Take 1 tablet (100 mg total) by mouth 3 (three) times daily. 12/09/16   [name], MD  ISENTRESS 400 MG tablet TAKE 1 TABLET BY MOUTH TWICE DAILY 05/04/17   Campbell Riches, MD  lamiVUDine (EPIVIR) 150 MG tablet TAKE 1 TABLET BY MOUTH DAILY 05/04/17   Campbell Riches, MD  levocetirizine (XYZAL) 5 MG tablet Take 1 tablet (5 mg total) by mouth every evening. 03/31/17   [name], FNP  miconazole (MICRO GUARD) 2 % powder Apply 1 application topically daily as  needed for itching.    [provider]  ondansetron (ZOFRAN ODT) 4 MG disintegrating tablet Take 1 tablet (4 mg total) by mouth every 8 (eight) hours as needed for nausea or vomiting. 03/18/17   Fransico Meadow, PA-C  potassium chloride SA (K-DUR,KLOR-CON) 20 MEQ tablet Take 30 mEq by mouth daily.    [provider]  spironolactone (ALDACTONE) 25 MG tablet Take 25 mg by mouth daily.    [provider]  zinc gluconate 50 MG tablet Take 100 mg by mouth daily.    [provider]    Family History Family History  Problem Relation Age of Onset  . Heart failure Mother   . Heart attack Maternal Grandmother   . Hypertension Sister   . Hypertension Brother     Social History Social History  Substance Use Topics  . Smoking status: Former Smoker    Quit date: 2000  . Smokeless tobacco: Former Systems developer    Quit date: 09/10/1999  . Alcohol use 8.4 oz/week    14 Shots of liquor per week     Comment: Light use     Allergies   Lisinopril   Review of Systems Review of Systems  Genitourinary: Positive for flank pain and frequency.  All other systems reviewed and are negative.    Physical Exam Updated Vital Signs BP (!) 190/131   Pulse 71   Temp 100.1 F (37.8 C) (Oral)   Resp 18   Ht 5' 10"  (1.778 m)   Wt 95.7 kg (211 lb)   SpO2 97%   BMI 30.28 kg/m   Physical Exam  Constitutional: He is oriented to person, place, and time. He appears well-developed and well-nourished. No distress.  HENT:  Head: Normocephalic and atraumatic.  Right Ear: Hearing normal.  Left Ear: Hearing normal.  Nose: Nose normal.  Mouth/Throat: Oropharynx is clear and moist and mucous membranes are normal.  Eyes: Pupils are equal, round, and reactive to light. Conjunctivae and EOM are normal.  Neck: Normal range of motion. Neck supple.  Cardiovascular: Regular rhythm, S1 normal and S2 normal.  Exam reveals no gallop and no friction rub.   No murmur heard. Pulmonary/Chest:  Effort normal and breath sounds normal. No respiratory distress. He exhibits no tenderness.  Abdominal: Soft. Normal appearance and bowel sounds are normal. There is no hepatosplenomegaly. There is no tenderness. There is CVA tenderness (R>L). There is no rebound, no guarding, no tenderness at McBurney's point and negative Murphy's sign. No hernia.  Musculoskeletal: Normal range of motion.  Neurological: He is alert and oriented to person, place, and time. He has normal strength. No cranial nerve deficit or sensory deficit. Coordination normal. GCS eye subscore is 4. GCS verbal subscore is 5. GCS motor subscore is 6.  Skin: Skin is warm, dry and intact. No rash noted. No cyanosis.  Psychiatric: He has a normal mood and affect. His speech is normal and behavior is normal. Thought content normal.  Nursing note and vitals reviewed.    ED Treatments / Results  Labs (all labs ordered are listed, but only abnormal results are displayed) Labs Reviewed  COMPREHENSIVE METABOLIC PANEL - Abnormal; Notable for the following:       Result Value   Potassium 3.4 (*)    Glucose, Bld 104 (*)    BUN 30 (*)    Creatinine, Ser 4.11 (*)    ALT 13 (*)    GFR calc non Af Amer 15 (*)    GFR calc Af Amer 18 (*)    All other components within normal limits  CBC WITH DIFFERENTIAL/PLATELET - Abnormal; Notable for the following:    WBC 10.9 (*)    RBC 4.02 (*)    Hemoglobin 12.3 (*)    HCT 35.4 (*)    Neutro Abs 7.8 (*)    All other components within normal limits  URINALYSIS, ROUTINE W REFLEX MICROSCOPIC - Abnormal; Notable for the following:    APPearance CLOUDY (*)    Hgb urine dipstick MODERATE (*)    Protein, ur >300 (*)    Leukocytes, UA SMALL (*)    All other components within normal limits  URINALYSIS, MICROSCOPIC (REFLEX) - Abnormal; Notable for the following:    Bacteria, UA FEW (*)    All other components within normal limits  CULTURE, BLOOD (ROUTINE X 2)  CULTURE, BLOOD (ROUTINE X 2)    PROTIME-INR  I-STAT CG4 LACTIC ACID, ED  I-STAT CG4 LACTIC ACID, ED    EKG  EKG Interpretation None       Radiology Dg Chest 2 View  Result Date: 05/31/2017 CLINICAL DATA:  51 year old male with fever and bilateral flank pain. HIV. EXAM: CHEST  2 VIEW COMPARISON:  CT Abdomen and Pelvis 03/18/2017 FINDINGS: Stable cardiomegaly and mediastinal contours. Other mediastinal contours are within normal limits. Visualized tracheal air column is within normal limits. No pneumothorax, pulmonary edema, pleural effusion or confluent pulmonary opacity. No acute osseous abnormality identified. Stable cholecystectomy clips. Negative visible bowel gas pattern. IMPRESSION: Stable cardiomegaly. No acute cardiopulmonary abnormality. Electronically Signed   By: Genevie Ann M.D.   On: 05/31/2017 21:45   Ct Renal Stone Study  Result Date: 06/01/2017 CLINICAL DATA:  Acute onset of right flank pain.  Initial encounter. EXAM: CT ABDOMEN AND PELVIS WITHOUT CONTRAST TECHNIQUE: Multidetector CT imaging of the abdomen and pelvis was performed following the standard protocol without IV contrast. COMPARISON:  CT of the abdomen and pelvis performed 03/18/2017, and renal ultrasound performed 04/02/2017 FINDINGS: Lower chest: The visualized lung bases are grossly clear. The heart is borderline enlarged. Hepatobiliary: The liver is unremarkable in appearance. The patient is status post cholecystectomy, with clips noted at the gallbladder fossa. The common bile duct remains normal in caliber. Pancreas: The pancreas is within normal limits. Spleen: The spleen is unremarkable in appearance. Adrenals/Urinary Tract: The adrenal glands are unremarkable in appearance. The kidneys are within normal limits. There is no evidence of hydronephrosis. No renal or ureteral stones are identified. No perinephric stranding is seen. Stomach/Bowel: The stomach is unremarkable in appearance. The small bowel is within normal limits. The appendix is normal  in caliber, without evidence of appendicitis. The colon is unremarkable in appearance. Vascular/Lymphatic: Scattered calcification is seen along the abdominal aorta and its branches. The abdominal aorta  is otherwise grossly unremarkable. The inferior vena cava is grossly unremarkable. No retroperitoneal lymphadenopathy is seen. No pelvic sidewall lymphadenopathy is identified. Reproductive: The bladder is mildly distended and grossly unremarkable. The prostate remains normal in size. Other: No additional soft tissue abnormalities are seen. Musculoskeletal: No acute osseous abnormalities are identified. The visualized musculature is unremarkable in appearance. IMPRESSION: 1. No acute abnormality seen within the abdomen or pelvis. 2. Scattered aortic atherosclerosis. 3. Borderline cardiomegaly. Electronically Signed   By: Garald Balding M.D.   On: 06/01/2017 00:10    Procedures Procedures (including critical care time)  Medications Ordered in ED Medications  oxyCODONE-acetaminophen (PERCOCET/ROXICET) 5-325 MG per tablet (not administered)  cefTRIAXone (ROCEPHIN) 2 g in dextrose 5 % 50 mL IVPB (2 g Intravenous New Bag/Given 06/01/17 0044)  oxyCODONE-acetaminophen (PERCOCET/ROXICET) 5-325 MG per tablet 1 tablet (1 tablet Oral Given 05/31/17 2107)  HYDROmorphone (DILAUDID) injection 1 mg (1 mg Intravenous Given 05/31/17 2348)  ondansetron (ZOFRAN) injection 4 mg (4 mg Intravenous Given 05/31/17 2348)  0.9 %  sodium chloride infusion ( Intravenous New Bag/Given 05/31/17 2348)     Initial Impression / Assessment and Plan / ED Course  I have reviewed the triage vital signs and the nursing notes.  Pertinent labs & imaging results that were available during my care of the patient were reviewed by me and considered in my medical decision making (see chart for details).     Patient presents to the emergency department for evaluation of bilateral flank pain, right greater than left. He did have urinary frequency  earlier tonight, however, this has resolved over the course of the day as the pain developed. Patient does have a history of HIV, viral load is undetectable and CD4 cell counts are adequate. This was from 7 days ago.  Patient did have a low-grade fever of 100.1 at arrival. He was unaware of any fever before presentation. He does not appear septic. There is no tachycardia, hypotension, significant leukocytosis. Lactic acid is normal.  Patient has a history of chronic renal failure. Creatinine 1 week ago was 3.58, 4.11 today. He admits that he has not been eating or drinking, will be gently hydrated here in the ER. Urinalysis does suggest infection. Blood and urine cultures pending. As he is not septic, appears to be tolerating oral intake, will be given Rocephin here in the ER and is appropriate for further outpatient management. Return precautions given.  Final Clinical Impressions(s) / ED Diagnoses   Final diagnoses:  Flank pain  Urinary tract infection without hematuria, site unspecified    New Prescriptions New Prescriptions   No medications on file     Orpah Greek, MD 06/01/17 [phone]

## 2017-06-01 NOTE — ED Triage Notes (Signed)
Pt presents for evaluation of ongoing nausea and back pain since yesterday. Pt seen here yesterday for same. Febrile today 101.2 in triage. No meds PTA. Pt denies vomiting/diarrhea.

## 2017-06-01 NOTE — ED Provider Notes (Signed)
[facility] DEPT Provider Note   CSN: [id number] Arrival date & time: 06/01/17  0907     History   Chief Complaint Chief Complaint  Patient presents with  . Nausea    HPI Albert Eaton is a 51 y.o. male.  Patient is a 51 year old male with a history of CHF, chronic kidney disease, HIV and gout who presents with fever and myalgias. He states that he's had a 2 day history of achiness and nausea. He was noted to have a fever this morning. He has a mild headache but denies any spinal pain. No cough or URI symptoms. He's been having a little bit of dysuria. No penile discharge. He's been having some intermittent cramping of his abdomen but denies any cramping currently. He is compliant with his HIV medicines and his last counts have been normal. He was seen here last night for similar symptoms. He had labs a urinalysis and a CT scan of his abdomen and pelvis which did not show any acute abnormalities. His urine didn't look infected and he was started on antibiotics. He had been given a dose of IV Rocephin. He states when he went home he started to have more nausea and was noted to have a fever so he came back for further evaluation. No vomiting. No diarrhea. He does have a prior history of colitis.      Past Medical History:  Diagnosis Date  . Chronic diastolic CHF (congestive heart failure) ([place]) 01/06/2017   Echo 7/16 [facility] in [place], [name]) Mild AI, mild LAE, mild concentric LVH, EF 55, normal wall motion, mild to moderate MR, mild PI, RVSP 55 // Echo 10/09/16 ([facility]):  Moderate LVH, grade 2 diastolic dysfunction, mild MR, moderate LAE   . CKD (chronic kidney disease)   . Gout   . History of nuclear stress test    a. Nuc study 7/16: no scar or ischemia, EF 42 // b. Nuc study 12/17: EF 48, ?small apical ischemia, Low Risk  . HIV (human immunodeficiency virus infection) ([place])   . HLD (hyperlipidemia)   . Hypertensive heart disease with CHF (congestive heart failure)  ([place]) 10/09/2016  . OSA (obstructive sleep apnea)     Patient Active Problem List   Diagnosis Date Noted  . Arthritis, gouty 01/27/2017  . Chronic diastolic CHF (congestive heart failure) ([place]) 01/06/2017  . OSA (obstructive sleep apnea) 01/06/2017  . Abnormal electrocardiogram (ECG) (EKG) 10/31/2016  . Chest pain 10/09/2016  . HIV disease ([place]) 10/09/2016  . CKD stage 4 secondary to hypertension ([place]) 10/09/2016  . Hypertensive heart disease with CHF (congestive heart failure) ([place]) 10/09/2016    Past Surgical History:  Procedure Laterality Date  . CHOLECYSTECTOMY    . HERNIA REPAIR     As baby       Home Medications    Prior to Admission medications   Medication Sig Start Date End Date Taking? Authorizing Provider  abacavir (ZIAGEN) 300 MG tablet TAKE 1 TABLET BY MOUTH DAILY 05/04/17  Yes Campbell Riches, MD  allopurinol (ZYLOPRIM) 100 MG tablet Take 1 tablet (100 mg total) by mouth 2 (two) times daily. 01/02/17  Yes [name], FNP  amLODipine (NORVASC) 10 MG tablet Take 1 tablet (10 mg total) by mouth daily. 10/31/16  Yes [name], FNP  atorvastatin (LIPITOR) 40 MG tablet Take 1 tablet (40 mg total) by mouth daily. 10/31/16  Yes [name], FNP  carvedilol (COREG) 25 MG tablet Take 1 tablet (25 mg total)  by mouth 2 (two) times daily with a meal. 12/09/16  Yes [name], MD  cephALEXin (KEFLEX) 500 MG capsule Take 1 capsule (500 mg total) by mouth 2 (two) times daily. 06/01/17  Yes Pollina, Gwenyth Allegra, MD  cloNIDine (CATAPRES) 0.1 MG tablet Take 1 tablet (0.1 mg total) by mouth 2 (two) times daily. 01/06/17  Yes Weaver, Scott T, PA-C  famotidine (PEPCID) 20 MG tablet Take 1 tablet (20 mg total) by mouth 2 (two) times daily. 01/06/17  Yes Weaver, Scott T, PA-C  furosemide (LASIX) 20 MG tablet Take 60 mg by mouth 2 (two) times daily.   Yes [provider]  gabapentin (NEURONTIN) 300 MG capsule Take 1 capsule (300 mg total) by mouth 3 (three)  times daily. 12/25/16  Yes [name], FNP  hydrALAZINE (APRESOLINE) 100 MG tablet Take 1 tablet (100 mg total) by mouth 3 (three) times daily. 12/09/16  Yes [name], MD  ISENTRESS 400 MG tablet TAKE 1 TABLET BY MOUTH TWICE DAILY 05/04/17  Yes Campbell Riches, MD  lamiVUDine (EPIVIR) 150 MG tablet TAKE 1 TABLET BY MOUTH DAILY 05/04/17  Yes Campbell Riches, MD  levocetirizine (XYZAL) 5 MG tablet Take 1 tablet (5 mg total) by mouth every evening. 03/31/17  Yes [name], FNP  miconazole (MICRO GUARD) 2 % powder Apply 1 application topically daily as needed for itching.   Yes [provider]  potassium chloride SA (K-DUR,KLOR-CON) 20 MEQ tablet Take 30 mEq by mouth daily.   Yes [provider]  spironolactone (ALDACTONE) 25 MG tablet Take 25 mg by mouth daily.   Yes [provider]  traMADol (ULTRAM) 50 MG tablet Take 1 tablet (50 mg total) by mouth every 6 (six) hours as needed. 06/01/17  Yes Pollina, Gwenyth Allegra, MD  zinc gluconate 50 MG tablet Take 100 mg by mouth daily.   Yes [provider]  ondansetron (ZOFRAN ODT) 4 MG disintegrating tablet 44m ODT q4 hours prn nausea/vomit 06/01/17   BMalvin Johns MD    Family History Family History  Problem Relation Age of Onset  . Heart failure Mother   . Heart attack Maternal Grandmother   . Hypertension Sister   . Hypertension Brother     Social History Social History  Substance Use Topics  . Smoking status: Former Smoker    Quit date: 2000  . Smokeless tobacco: Former [employer] developer   Quit date: 09/10/1999  . Alcohol use 8.4 oz/week    14 Shots of liquor per week     Comment: Light use     Allergies   Lisinopril   Review of Systems Review of Systems  Constitutional: Positive for fever. Negative for chills, diaphoresis and fatigue.  HENT: Negative for congestion, rhinorrhea and sneezing.   Eyes: Negative.   Respiratory: Negative for cough, chest tightness and shortness of breath.     Cardiovascular: Negative for chest pain and leg swelling.  Gastrointestinal: Positive for abdominal pain and nausea. Negative for blood in stool, diarrhea and vomiting.  Genitourinary: Negative for difficulty urinating, flank pain, frequency and hematuria.  Musculoskeletal: Positive for myalgias. Negative for arthralgias and back pain.  Skin: Negative for rash.  Neurological: Positive for headaches (Mild bifrontal). Negative for dizziness, speech difficulty, weakness and numbness.     Physical Exam Updated Vital Signs BP (!) 168/108 (BP Location: Right Arm)   Pulse 88   Temp 99.7 F (37.6 C) (Oral)   Resp 16   SpO2 97%   Physical  Exam  Constitutional: He is oriented to person, place, and time. He appears well-developed and well-nourished.  HENT:  Head: Normocephalic and atraumatic.  Eyes: Pupils are equal, round, and reactive to light.  Neck: Normal range of motion. Neck supple.  No meningismus  Cardiovascular: Normal rate, regular rhythm and normal heart sounds.   Pulmonary/Chest: Effort normal and breath sounds normal. No respiratory distress. He has no wheezes. He has no rales. He exhibits no tenderness.  Abdominal: Soft. Bowel sounds are normal. There is no tenderness. There is no rebound and no guarding.  Musculoskeletal: Normal range of motion. He exhibits no edema.  No joint swelling or tenderness  Lymphadenopathy:    He has no cervical adenopathy.  Neurological: He is alert and oriented to person, place, and time.  Skin: Skin is warm and dry. No rash noted.  Psychiatric: He has a normal mood and affect.     ED Treatments / Results  Labs (all labs ordered are listed, but only abnormal results are displayed) Labs Reviewed  COMPREHENSIVE METABOLIC PANEL - Abnormal; Notable for the following:       Result Value   Potassium 3.4 (*)    CO2 21 (*)    Glucose, Bld 105 (*)    BUN 35 (*)    Creatinine, Ser 4.67 (*)    AST 14 (*)    ALT 12 (*)    GFR calc non Af Amer  13 (*)    GFR calc Af Amer 15 (*)    All other components within normal limits  CBC WITH DIFFERENTIAL/PLATELET - Abnormal; Notable for the following:    WBC 12.7 (*)    RBC 3.84 (*)    Hemoglobin 11.7 (*)    HCT 33.7 (*)    Neutro Abs 9.6 (*)    Monocytes Absolute 1.3 (*)    All other components within normal limits  URINALYSIS, ROUTINE W REFLEX MICROSCOPIC - Abnormal; Notable for the following:    APPearance HAZY (*)    Hgb urine dipstick SMALL (*)    Protein, ur >=300 (*)    Leukocytes, UA SMALL (*)    Bacteria, UA FEW (*)    Squamous Epithelial / LPF 0-5 (*)    All other components within normal limits  I-STAT CHEM 8, ED - Abnormal; Notable for the following:    Potassium 3.2 (*)    BUN 36 (*)    Creatinine, Ser 4.80 (*)    Hemoglobin 11.6 (*)    HCT 34.0 (*)    All other components within normal limits  I-STAT CG4 LACTIC ACID, ED  I-STAT CG4 LACTIC ACID, ED  I-STAT CG4 LACTIC ACID, ED    EKG  EKG Interpretation None       Radiology Dg Chest 2 View  Result Date: 05/31/2017 CLINICAL DATA:  51 year old male with fever and bilateral flank pain. HIV. EXAM: CHEST  2 VIEW COMPARISON:  CT Abdomen and Pelvis 03/18/2017 FINDINGS: Stable cardiomegaly and mediastinal contours. Other mediastinal contours are within normal limits. Visualized tracheal air column is within normal limits. No pneumothorax, pulmonary edema, pleural effusion or confluent pulmonary opacity. No acute osseous abnormality identified. Stable cholecystectomy clips. Negative visible bowel gas pattern. IMPRESSION: Stable cardiomegaly. No acute cardiopulmonary abnormality. Electronically Signed   By: Genevie Ann M.D.   On: 05/31/2017 21:45   Ct Renal Stone Study  Result Date: 06/01/2017 CLINICAL DATA:  Acute onset of right flank pain.  Initial encounter. EXAM: CT ABDOMEN AND PELVIS WITHOUT CONTRAST TECHNIQUE: Multidetector  CT imaging of the abdomen and pelvis was performed following the standard protocol without IV  contrast. COMPARISON:  CT of the abdomen and pelvis performed 03/18/2017, and renal ultrasound performed 04/02/2017 FINDINGS: Lower chest: The visualized lung bases are grossly clear. The heart is borderline enlarged. Hepatobiliary: The liver is unremarkable in appearance. The patient is status post cholecystectomy, with clips noted at the gallbladder fossa. The common bile duct remains normal in caliber. Pancreas: The pancreas is within normal limits. Spleen: The spleen is unremarkable in appearance. Adrenals/Urinary Tract: The adrenal glands are unremarkable in appearance. The kidneys are within normal limits. There is no evidence of hydronephrosis. No renal or ureteral stones are identified. No perinephric stranding is seen. Stomach/Bowel: The stomach is unremarkable in appearance. The small bowel is within normal limits. The appendix is normal in caliber, without evidence of appendicitis. The colon is unremarkable in appearance. Vascular/Lymphatic: Scattered calcification is seen along the abdominal aorta and its branches. The abdominal aorta is otherwise grossly unremarkable. The inferior vena cava is grossly unremarkable. No retroperitoneal lymphadenopathy is seen. No pelvic sidewall lymphadenopathy is identified. Reproductive: The bladder is mildly distended and grossly unremarkable. The prostate remains normal in size. Other: No additional soft tissue abnormalities are seen. Musculoskeletal: No acute osseous abnormalities are identified. The visualized musculature is unremarkable in appearance. IMPRESSION: 1. No acute abnormality seen within the abdomen or pelvis. 2. Scattered aortic atherosclerosis. 3. Borderline cardiomegaly. Electronically Signed   By: Garald Balding M.D.   On: 06/01/2017 00:10    Procedures Procedures (including critical care time)  Medications Ordered in ED Medications  acetaminophen (TYLENOL) 325 MG tablet (not administered)  acetaminophen (TYLENOL) tablet 650 mg (650 mg Oral  Given 06/01/17 0948)  acetaminophen (TYLENOL) tablet 1,000 mg (1,000 mg Oral Given 06/01/17 1331)  sodium chloride 0.9 % bolus 1,000 mL (0 mLs Intravenous Stopped 06/01/17 1450)  ondansetron (ZOFRAN) injection 4 mg (4 mg Intravenous Given 06/01/17 1332)     Initial Impression / Assessment and Plan / ED Course  I have reviewed the triage vital signs and the nursing notes.  Pertinent labs & imaging results that were available during my care of the patient were reviewed by me and considered in my medical decision making (see chart for details).    Patient presents with fever and nausea. He was recently diagnosed yesterday with the UTI. He will continue antibiotics for that. He has no signs of sepsis. He's feeling much better after treatment with IV fluids and Zofran in the ED. He has no vomiting. His fever has improved. He has no abdominal tenderness on exam he had a recent CT scan of his abdomen and pelvis yesterday without acute findings. He has no skin wounds. No suggestions of pneumonia. No other obvious source for infection. He was discharged home in good condition. He was notified that his kidneys are steadily worsening. He has no chronic kidney disease. He states he is going to follow-up with his PCP [facility]. Return precautions were given.  Final Clinical Impressions(s) / ED Diagnoses   Final diagnoses:  Febrile illness  Chronic kidney disease, unspecified CKD stage    New Prescriptions New Prescriptions   ONDANSETRON (ZOFRAN ODT) 4 MG DISINTEGRATING TABLET    32m ODT q4 hours prn nausea/vomit     BMalvin Johns MD 06/01/17 1544

## 2017-06-02 ENCOUNTER — Ambulatory Visit (INDEPENDENT_AMBULATORY_CARE_PROVIDER_SITE_OTHER): Payer: Medicaid Other | Admitting: Family Medicine

## 2017-06-02 ENCOUNTER — Encounter: Payer: Self-pay | Admitting: Family Medicine

## 2017-06-02 VITALS — BP 140/88 | HR 83 | Temp 98.7°F | Resp 16 | Ht 70.0 in | Wt 206.0 lb

## 2017-06-02 DIAGNOSIS — I151 Hypertension secondary to other renal disorders: Secondary | ICD-10-CM | POA: Diagnosis not present

## 2017-06-02 DIAGNOSIS — L2489 Irritant contact dermatitis due to other agents: Secondary | ICD-10-CM

## 2017-06-02 DIAGNOSIS — N184 Chronic kidney disease, stage 4 (severe): Secondary | ICD-10-CM | POA: Diagnosis not present

## 2017-06-02 DIAGNOSIS — N2889 Other specified disorders of kidney and ureter: Secondary | ICD-10-CM | POA: Diagnosis not present

## 2017-06-02 LAB — POCT URINALYSIS DIP (DEVICE)
BILIRUBIN URINE: NEGATIVE
GLUCOSE, UA: NEGATIVE mg/dL
Hgb urine dipstick: NEGATIVE
Ketones, ur: NEGATIVE mg/dL
NITRITE: NEGATIVE
PH: 5.5 (ref 5.0–8.0)
PROTEIN: 100 mg/dL — AB
Specific Gravity, Urine: 1.015 (ref 1.005–1.030)
Urobilinogen, UA: 0.2 mg/dL (ref 0.0–1.0)

## 2017-06-02 MED ORDER — TRIAMCINOLONE ACETONIDE 0.1 % EX CREA: 1.0000 "application " | TOPICAL_CREAM | Freq: Two times a day (BID) | CUTANEOUS | 0 refills | Status: DC

## 2017-06-02 MED ORDER — PREDNISONE 20 MG PO TABS: ORAL_TABLET | ORAL | 0 refills | Status: DC

## 2017-06-02 MED ORDER — FUROSEMIDE 20 MG PO TABS: 30.0000 mg | ORAL_TABLET | Freq: Every day | ORAL | 0 refills | Status: DC

## 2017-06-02 NOTE — Patient Instructions (Addendum)
Take prednisone as follows for skin rash: Take Prednisone 20 mg,  in mornings with breakfast as follows:  Take 3 pills for 3 days, Take 2 pills for 3 days, and Take 1 pill for 3 days.  Complete all medication.  Apply triamcinolone to axilla twice daily. Avoid deodorant at bedtime.   Return in 4 week for renal labs. We will contact you regarding your nephrology referral.    Contact Dermatitis Dermatitis is redness, soreness, and swelling (inflammation) of the skin. Contact dermatitis is a reaction to certain substances that touch the skin. You either touched something that irritated your skin, or you have allergies to something you touched. Follow these instructions at home: [place] your skin as needed.  Apply cool compresses to the affected areas.  Try taking a bath with: ? Epsom salts. Follow the instructions on the package. You can get these at a pharmacy or grocery store. ? Baking soda. Pour a small amount into the bath as told by your doctor. ? Colloidal oatmeal. Follow the instructions on the package. You can get this at a pharmacy or grocery store.  Try applying baking soda paste to your skin. Stir water into baking soda until it looks like paste.  Do not scratch your skin.  Bathe less often.  Bathe in lukewarm water. Avoid using hot water. Medicines  Take or apply over-the-counter and prescription medicines only as told by your doctor.  If you were prescribed an antibiotic medicine, take or apply your antibiotic as told by your doctor. Do not stop taking the antibiotic even if your condition starts to get better. General instructions  Keep all follow-up visits as told by your doctor. This is important.  Avoid the substance that caused your reaction. If you do not know what caused it, keep a journal to try to track what caused it. Write down: ? What you eat. ? What cosmetic products you use. ? What you drink. ? What you wear in the affected area. This  includes jewelry.  If you were given a bandage (dressing), take care of it as told by your doctor. This includes when to change and remove it. Contact a doctor if:  You do not get better with treatment.  Your condition gets worse.  You have signs of infection such as: ? Swelling. ? Tenderness. ? Redness. ? Soreness. ? Warmth.  You have a fever.  You have new symptoms. Get help right away if:  You have a very bad headache.  You have neck pain.  Your neck is stiff.  You throw up (vomit).  You feel very sleepy.  You see red streaks coming from the affected area.  Your bone or joint underneath the affected area becomes painful after the skin has healed.  The affected area turns darker.  You have trouble breathing. This information is not intended to replace advice given to you by your health care provider. Make sure you discuss any questions you have with your health care provider. Document Released: 08/10/2009 Document Revised: 03/20/2016 Document Reviewed: 02/28/2015 Elsevier Interactive Patient Education  2018 [employer].

## 2017-06-02 NOTE — Progress Notes (Signed)
Patient ID: Albert Eaton, male    DOB: [birth year], 51 y.o.   MRN: [id number]  PCP: [name], FNP  Chief Complaint  Patient presents with  . Hospitalization Follow-up  . Rash    under arm area    Subjective:  HPI Albert Eaton is a 51 y.o. male presents for a hospital follow-up and evaluation of chronic bilateral axilla rash. Medical problems include Chronic heart failure, stage IV kidney disease, HIV, hypertension, and obstructive sleep apnea.  Chronic Kidney Disease Stage 4 Albert Eaton was recently seen in the emergency department 05/31/2017 and treated for UTI infection. He initially presented with symptoms of flank pain which were attributed to an active UTI infection. He was prescribed Keflex and reports that he is going to pick up that prescription on today. He subsequently presented to the ED the following day with the complaint of fever, and was treated with 1 dose of IV Rocephin and advised to resume his Keflex. CMP collected during recent hospitalization showed worsening renal function, current BUN 35 and creatinine 4.67. Albert Eaton continues to produce urine, however he endorses frequent episodes of nocturia nightly. He has yet to follow-up with a prior referral to [facility] although he reports completing and return of [facility] Patient Assistance application over 1 week ago. Albert Eaton has been reluctant to follow-up with nephrology as he is uninsured and has limited financial resources. Albert Eaton denies any fever overnight or this morning. Reports that overall he feels much improved.   Hypertension  Albert Eaton reports no home monitoring of blood pressure. He reports adherence to blood pressure medication regimen. He required no refills today. Reports efforts to adhere to low sodium diet. He is a nonsmoker. Denies any episodes of dizziness, headaches, shortness of breath, or chest pain. Mostly leads a sedentary lifestyle.  Bilateral Axilla Rash  Rash under both arms  for several months. When rash started he changed detergents and deodorants, however the rash did not improved. He attempted relief with Lotrisone cream which caused some burning under his arm, therefore he discontinued use. Rash continues to itch profusely and itching is exacerbated by sweating under axilla. This rash is only present in axilla area and expand to the outer shoulder (posteriorly and anteriorly).    Social History   Social History  . Marital status: Unknown    Spouse name: N/A  . Number of children: N/A  . Years of education: N/A   Occupational History  . Not on file.   Social History Main Topics  . Smoking status: Former Smoker    Quit date: 2000  . Smokeless tobacco: Former Systems developer    Quit date: 09/10/1999  . Alcohol use 8.4 oz/week    14 Shots of liquor per week     Comment: Light use  . Drug use: No  . Sexual activity: No     Comment: declined condoms   Other Topics Concern  . Not on file   Social History Narrative  . No narrative on file    Family History  Problem Relation Age of Onset  . Heart failure Mother   . Heart attack Maternal Grandmother   . Hypertension Sister   . Hypertension Brother    Review of Systems See HPI  Patient Active Problem List   Diagnosis Date Noted  . Arthritis, gouty 01/27/2017  . Chronic diastolic CHF (congestive heart failure) ([place]) 01/06/2017  . OSA (obstructive sleep apnea) 01/06/2017  . Abnormal electrocardiogram (ECG) (EKG) 10/31/2016  . Chest pain 10/09/2016  .  HIV disease ([place]) 10/09/2016  . CKD stage 4 secondary to hypertension ([place]) 10/09/2016  . Hypertensive heart disease with CHF (congestive heart failure) ([place]) 10/09/2016    Allergies  Allergen Reactions  . Lisinopril Cough    Prior to Admission medications   Medication Sig Start Date End Date Taking? Authorizing Provider  abacavir (ZIAGEN) 300 MG tablet TAKE 1 TABLET BY MOUTH DAILY 05/04/17  Yes Campbell Riches, MD  allopurinol (ZYLOPRIM) 100 MG  tablet Take 1 tablet (100 mg total) by mouth 2 (two) times daily. 01/02/17  Yes [name], FNP  amLODipine (NORVASC) 10 MG tablet Take 1 tablet (10 mg total) by mouth daily. 10/31/16  Yes [name], FNP  atorvastatin (LIPITOR) 40 MG tablet Take 1 tablet (40 mg total) by mouth daily. 10/31/16  Yes [name], FNP  carvedilol (COREG) 25 MG tablet Take 1 tablet (25 mg total) by mouth 2 (two) times daily with a meal. 12/09/16  Yes [name], MD  cephALEXin (KEFLEX) 500 MG capsule Take 1 capsule (500 mg total) by mouth 2 (two) times daily. 06/01/17  Yes Pollina, Gwenyth Allegra, MD  cloNIDine (CATAPRES) 0.1 MG tablet Take 1 tablet (0.1 mg total) by mouth 2 (two) times daily. 01/06/17  Yes Weaver, Scott T, PA-C  famotidine (PEPCID) 20 MG tablet Take 1 tablet (20 mg total) by mouth 2 (two) times daily. 01/06/17  Yes Weaver, Scott T, PA-C  furosemide (LASIX) 20 MG tablet Take 60 mg by mouth 2 (two) times daily.   Yes [provider]  gabapentin (NEURONTIN) 300 MG capsule Take 1 capsule (300 mg total) by mouth 3 (three) times daily. 12/25/16  Yes [name], FNP  hydrALAZINE (APRESOLINE) 100 MG tablet Take 1 tablet (100 mg total) by mouth 3 (three) times daily. 12/09/16  Yes [name], MD  ISENTRESS 400 MG tablet TAKE 1 TABLET BY MOUTH TWICE DAILY 05/04/17  Yes Campbell Riches, MD  lamiVUDine (EPIVIR) 150 MG tablet TAKE 1 TABLET BY MOUTH DAILY 05/04/17  Yes Campbell Riches, MD  levocetirizine (XYZAL) 5 MG tablet Take 1 tablet (5 mg total) by mouth every evening. 03/31/17  Yes [name], FNP  miconazole (MICRO GUARD) 2 % powder Apply 1 application topically daily as needed for itching.   Yes [provider]  ondansetron (ZOFRAN ODT) 4 MG disintegrating tablet 33m ODT q4 hours prn nausea/vomit 06/01/17  Yes BMalvin Johns MD  potassium chloride SA (K-DUR,KLOR-CON) 20 MEQ tablet Take 30 mEq by mouth daily.   Yes [provider]  spironolactone  (ALDACTONE) 25 MG tablet Take 25 mg by mouth daily.   Yes [provider]  traMADol (ULTRAM) 50 MG tablet Take 1 tablet (50 mg total) by mouth every 6 (six) hours as needed. 06/01/17  Yes Pollina, CGwenyth Allegra MD  zinc gluconate 50 MG tablet Take 100 mg by mouth daily.   Yes [provider]    Past Medical, Surgical Family and Social History reviewed and updated.    Objective:   Today's Vitals   06/02/17 0932  BP: 140/88  Pulse: 83  Resp: 16  Temp: 98.7 F (37.1 C)  TempSrc: Oral  SpO2: 99%  Weight: 206 lb (93.4 kg)  Height: 5' 10"  (1.778 m)    Wt Readings from Last 3 Encounters:  06/02/17 206 lb (93.4 kg)  05/31/17 211 lb (95.7 kg)  05/19/17 211 lb 6.4 oz (95.9 kg)   Physical Exam  Constitutional: He is oriented to  person, place, and time. He appears well-developed and well-nourished.  HENT:  Head: Normocephalic and atraumatic.  Eyes: Pupils are equal, round, and reactive to light. Conjunctivae are normal.  Neck: Normal range of motion. Neck supple.  Cardiovascular: Normal rate, regular rhythm, normal heart sounds and intact distal pulses.   Pulmonary/Chest: Effort normal and breath sounds normal.  Musculoskeletal: Normal range of motion. He exhibits no edema.  Neurological: He is alert and oriented to person, place, and time.  Skin: Skin is warm, dry and intact. Rash noted. Rash is macular and urticarial. Rash is not papular, not pustular and not vesicular.  Psychiatric: He has a normal mood and affect. His behavior is normal. Judgment and thought content normal.    Assessment & Plan:  1. Stage 4 chronic kidney disease ([place]), worsening renal function. Current GFR BUN 35 and Creatinine 4.67. -Reduced Furosemide to 30 mg once daily as patient was prescribed 60 mg twice daily to improve hydration of kidneys. He was advised that if he experiences shortness of breath or edema, to notify provider immediately. -Educated on avoidance of nephrotoxic  medication.  2. Hypertension secondary to other renal disorders -Continue current medication regimen.  3. Irritant contact dermatitis due to other agents -Take Prednisone 20 mg,  in mornings with breakfast as follows:  Take 3 pills for 3 days, Take 2 pills for 3 days, and Take 1 pill for 3 days.  Complete all medication. -Apply triamcinolone cream twice daily to affected skin.  RTC: 4 weeks for BMP (check renal function) and 3 months for hypertension follow-up   Carroll Sage. Kenton Kingfisher, MSN, FNP-C [facility]  [address] [place], [place] [address] [phone]

## 2017-06-03 ENCOUNTER — Telehealth: Payer: Self-pay | Admitting: Family Medicine

## 2017-06-03 LAB — URINE CULTURE: Culture: >=100000 — AB

## 2017-06-03 NOTE — Telephone Encounter (Signed)
Please review the referral for this patient and contact referrals to attempt to get patient schedule with nephrology.

## 2017-06-04 ENCOUNTER — Telehealth: Payer: Self-pay | Admitting: *Deleted

## 2017-06-04 NOTE — Telephone Encounter (Signed)
Post ED Visit - Positive Culture Follow-up  Culture report reviewed by antimicrobial stewardship pharmacist:  []  Elenor Quinones, Pharm.D. []  Heide Guile, Pharm.D., BCPS AQ-ID []  Parks Neptune, Pharm.D., BCPS []  Alycia Rossetti, Pharm.D., BCPS []  [place], Pharm.D., BCPS, AAHIVP []  Legrand Como, Pharm.D., BCPS, AAHIVP []  Salome Arnt, PharmD, BCPS []  Dimitri Ped, PharmD, BCPS []  Vincenza Hews, PharmD, BCPS Nida Boatman, PharmD  Positive urine culture Treated with Cephalexin, organism sensitive to the same and no further patient follow-up is required at this time.  Harlon Flor Talley 06/04/2017, 11:11 AM

## 2017-06-04 NOTE — Telephone Encounter (Signed)
Referral was sent to [facility] and the will be contacting patient to get him setup for appointment

## 2017-06-05 LAB — CULTURE, BLOOD (ROUTINE X 2)
CULTURE: NO GROWTH
Culture: NO GROWTH
SPECIAL REQUESTS: ADEQUATE
SPECIAL REQUESTS: ADEQUATE

## 2017-06-07 ENCOUNTER — Encounter (HOSPITAL_COMMUNITY): Payer: Self-pay | Admitting: Emergency Medicine

## 2017-06-07 ENCOUNTER — Observation Stay (HOSPITAL_COMMUNITY): Payer: Medicaid Other

## 2017-06-07 ENCOUNTER — Emergency Department (HOSPITAL_COMMUNITY): Payer: Medicaid Other

## 2017-06-07 ENCOUNTER — Other Ambulatory Visit (HOSPITAL_COMMUNITY): Payer: Self-pay

## 2017-06-07 ENCOUNTER — Observation Stay (HOSPITAL_COMMUNITY)
Admission: EM | Admit: 2017-06-07 | Discharge: 2017-06-08 | Disposition: A | Discharge status: Home / Self Care | Payer: Medicaid Other | Attending: Internal Medicine | Admitting: Internal Medicine

## 2017-06-07 ENCOUNTER — Other Ambulatory Visit: Payer: Self-pay

## 2017-06-07 DIAGNOSIS — M109 Gout, unspecified: Secondary | ICD-10-CM | POA: Insufficient documentation

## 2017-06-07 DIAGNOSIS — Z87891 Personal history of nicotine dependence: Secondary | ICD-10-CM | POA: Insufficient documentation

## 2017-06-07 DIAGNOSIS — Z8619 Personal history of other infectious and parasitic diseases: Secondary | ICD-10-CM | POA: Diagnosis not present

## 2017-06-07 DIAGNOSIS — N390 Urinary tract infection, site not specified: Secondary | ICD-10-CM | POA: Diagnosis not present

## 2017-06-07 DIAGNOSIS — R509 Fever, unspecified: Secondary | ICD-10-CM | POA: Diagnosis not present

## 2017-06-07 DIAGNOSIS — N184 Chronic kidney disease, stage 4 (severe): Secondary | ICD-10-CM | POA: Diagnosis not present

## 2017-06-07 DIAGNOSIS — J069 Acute upper respiratory infection, unspecified: Secondary | ICD-10-CM | POA: Diagnosis not present

## 2017-06-07 DIAGNOSIS — Z79899 Other long term (current) drug therapy: Secondary | ICD-10-CM | POA: Insufficient documentation

## 2017-06-07 DIAGNOSIS — B20 Human immunodeficiency virus [HIV] disease: Secondary | ICD-10-CM | POA: Diagnosis not present

## 2017-06-07 DIAGNOSIS — I129 Hypertensive chronic kidney disease with stage 1 through stage 4 chronic kidney disease, or unspecified chronic kidney disease: Secondary | ICD-10-CM | POA: Diagnosis present

## 2017-06-07 DIAGNOSIS — R0602 Shortness of breath: Secondary | ICD-10-CM | POA: Diagnosis present

## 2017-06-07 DIAGNOSIS — Z9049 Acquired absence of other specified parts of digestive tract: Secondary | ICD-10-CM | POA: Diagnosis not present

## 2017-06-07 DIAGNOSIS — I5033 Acute on chronic diastolic (congestive) heart failure: Secondary | ICD-10-CM | POA: Insufficient documentation

## 2017-06-07 DIAGNOSIS — R011 Cardiac murmur, unspecified: Secondary | ICD-10-CM

## 2017-06-07 DIAGNOSIS — E785 Hyperlipidemia, unspecified: Secondary | ICD-10-CM | POA: Diagnosis not present

## 2017-06-07 DIAGNOSIS — J189 Pneumonia, unspecified organism: Secondary | ICD-10-CM

## 2017-06-07 DIAGNOSIS — I10 Essential (primary) hypertension: Secondary | ICD-10-CM

## 2017-06-07 DIAGNOSIS — G4733 Obstructive sleep apnea (adult) (pediatric): Secondary | ICD-10-CM | POA: Diagnosis present

## 2017-06-07 DIAGNOSIS — D72829 Elevated white blood cell count, unspecified: Secondary | ICD-10-CM | POA: Diagnosis not present

## 2017-06-07 DIAGNOSIS — I130 Hypertensive heart and chronic kidney disease with heart failure and stage 1 through stage 4 chronic kidney disease, or unspecified chronic kidney disease: Principal | ICD-10-CM | POA: Insufficient documentation

## 2017-06-07 DIAGNOSIS — I5032 Chronic diastolic (congestive) heart failure: Secondary | ICD-10-CM | POA: Diagnosis present

## 2017-06-07 DIAGNOSIS — Z87440 Personal history of urinary (tract) infections: Secondary | ICD-10-CM | POA: Diagnosis not present

## 2017-06-07 DIAGNOSIS — Z8249 Family history of ischemic heart disease and other diseases of the circulatory system: Secondary | ICD-10-CM

## 2017-06-07 LAB — PROTIME-INR
INR: 1.05
Prothrombin Time: 13.7 seconds (ref 11.4–15.2)

## 2017-06-07 LAB — URINALYSIS, ROUTINE W REFLEX MICROSCOPIC
BACTERIA UA: NONE SEEN
BILIRUBIN URINE: NEGATIVE
Glucose, UA: NEGATIVE mg/dL
Hgb urine dipstick: NEGATIVE
KETONES UR: NEGATIVE mg/dL
Leukocytes, UA: NEGATIVE
NITRITE: NEGATIVE
PH: 5 (ref 5.0–8.0)
Protein, ur: >=300 mg/dL — AB
SPECIFIC GRAVITY, URINE: 1.012 (ref 1.005–1.030)
SQUAMOUS EPITHELIAL / LPF: NONE SEEN

## 2017-06-07 LAB — CBC WITH DIFFERENTIAL/PLATELET
BASOS PCT: 0 %
Basophils Absolute: 0 10*3/uL (ref 0.0–0.1)
EOS ABS: 0.1 10*3/uL (ref 0.0–0.7)
EOS PCT: 1 %
HCT: 32.6 % — ABNORMAL LOW (ref 39.0–52.0)
Hemoglobin: 11.6 g/dL — ABNORMAL LOW (ref 13.0–17.0)
LYMPHS ABS: 3.1 10*3/uL (ref 0.7–4.0)
Lymphocytes Relative: 22 %
MCH: 30.9 pg (ref 26.0–34.0)
MCHC: 35.6 g/dL (ref 30.0–36.0)
MCV: 86.7 fL (ref 78.0–100.0)
MONO ABS: 1.3 10*3/uL — AB (ref 0.1–1.0)
Monocytes Relative: 9 %
NEUTROS ABS: 9.8 10*3/uL — AB (ref 1.7–7.7)
Neutrophils Relative %: 68 %
PLATELETS: 332 10*3/uL (ref 150–400)
RBC: 3.76 MIL/uL — ABNORMAL LOW (ref 4.22–5.81)
RDW: 13.1 % (ref 11.5–15.5)
WBC: 14.3 10*3/uL — ABNORMAL HIGH (ref 4.0–10.5)

## 2017-06-07 LAB — COMPREHENSIVE METABOLIC PANEL
ALK PHOS: 56 U/L (ref 38–126)
ALT: 16 U/L — AB (ref 17–63)
AST: 17 U/L (ref 15–41)
Albumin: 3.6 g/dL (ref 3.5–5.0)
Anion gap: 13 (ref 5–15)
BILIRUBIN TOTAL: 0.3 mg/dL (ref 0.3–1.2)
BUN: 33 mg/dL — AB (ref 6–20)
CALCIUM: 9.7 mg/dL (ref 8.9–10.3)
CO2: 22 mmol/L (ref 22–32)
CREATININE: 3.43 mg/dL — AB (ref 0.61–1.24)
Chloride: 102 mmol/L (ref 101–111)
GFR calc Af Amer: 22 mL/min — ABNORMAL LOW (ref >60)
GFR, EST NON AFRICAN AMERICAN: 19 mL/min — AB (ref >60)
GLUCOSE: 94 mg/dL (ref 65–99)
POTASSIUM: 3.6 mmol/L (ref 3.5–5.1)
Sodium: 137 mmol/L (ref 135–145)
TOTAL PROTEIN: 7.1 g/dL (ref 6.5–8.1)

## 2017-06-07 LAB — I-STAT CG4 LACTIC ACID, ED
LACTIC ACID, VENOUS: 2.28 mmol/L — AB (ref 0.5–1.9)
Lactic Acid, Venous: 2.1 mmol/L (ref 0.5–1.9)

## 2017-06-07 LAB — PROCALCITONIN: Procalcitonin: 0.21 ng/mL

## 2017-06-07 LAB — BRAIN NATRIURETIC PEPTIDE: B NATRIURETIC PEPTIDE 5: 1059.2 pg/mL — AB (ref 0.0–100.0)

## 2017-06-07 IMAGING — DX DG CHEST 1V PORT
1 series · 1 of 1 positions shown · non-contrast
Comparison: [DATE]

CLINICAL DATA: Short of breath, fever, cough

EXAM:
PORTABLE CHEST 1 VIEW

[chest ap]
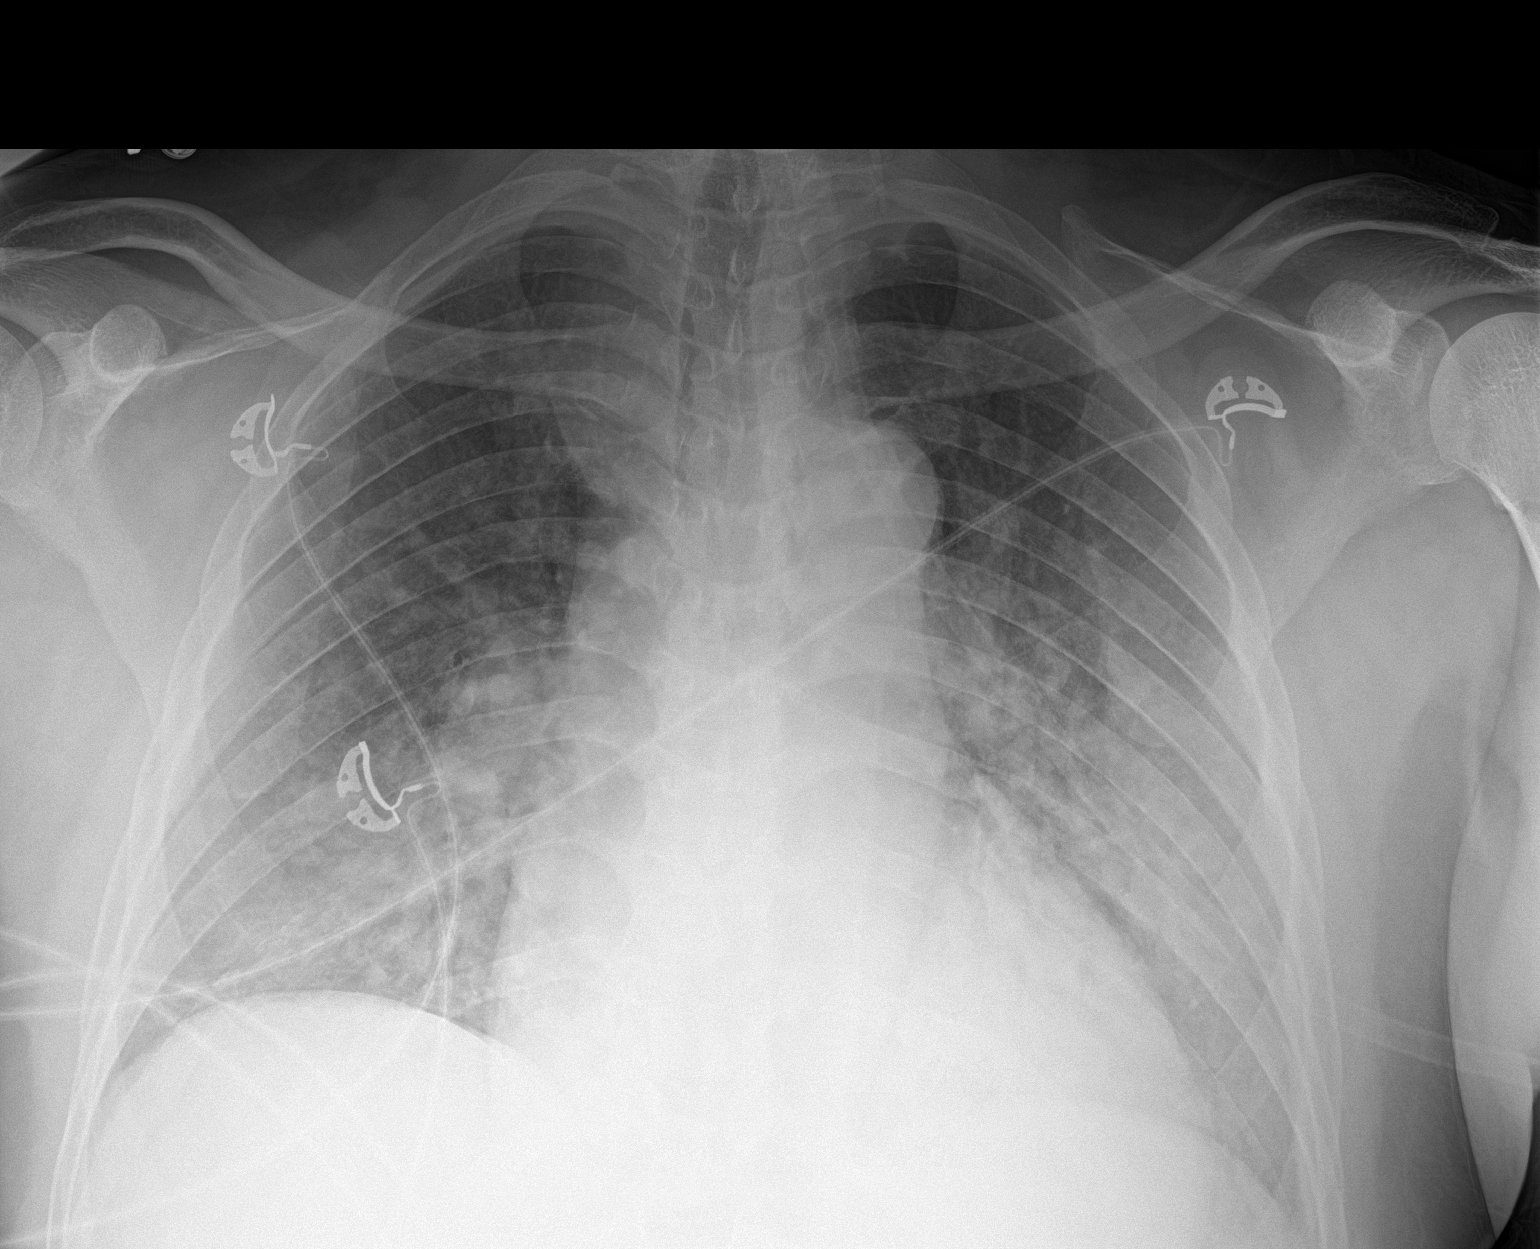

[1 of 1 positions shown; findings below may reference images not displayed]

FINDINGS: Mild cardiomegaly. Central basilar airspace opacities are confluent.
Vascular congestion. No pneumothorax or pleural effusion.
IMPRESSION: Cardiomegaly and bilateral airspace disease. Differential diagnosis
includes bilateral pneumonia, ARDS, and pulmonary edema.

## 2017-06-07 IMAGING — CR DG CHEST 2V
2 series · 2 of 2 positions shown · non-contrast
Comparison: Chest radiograph performed earlier today at [DATE] a.m.

CLINICAL DATA: Acute onset of shortness of breath and chills.
Initial encounter.

EXAM:
CHEST  2 VIEW

[chest pa]
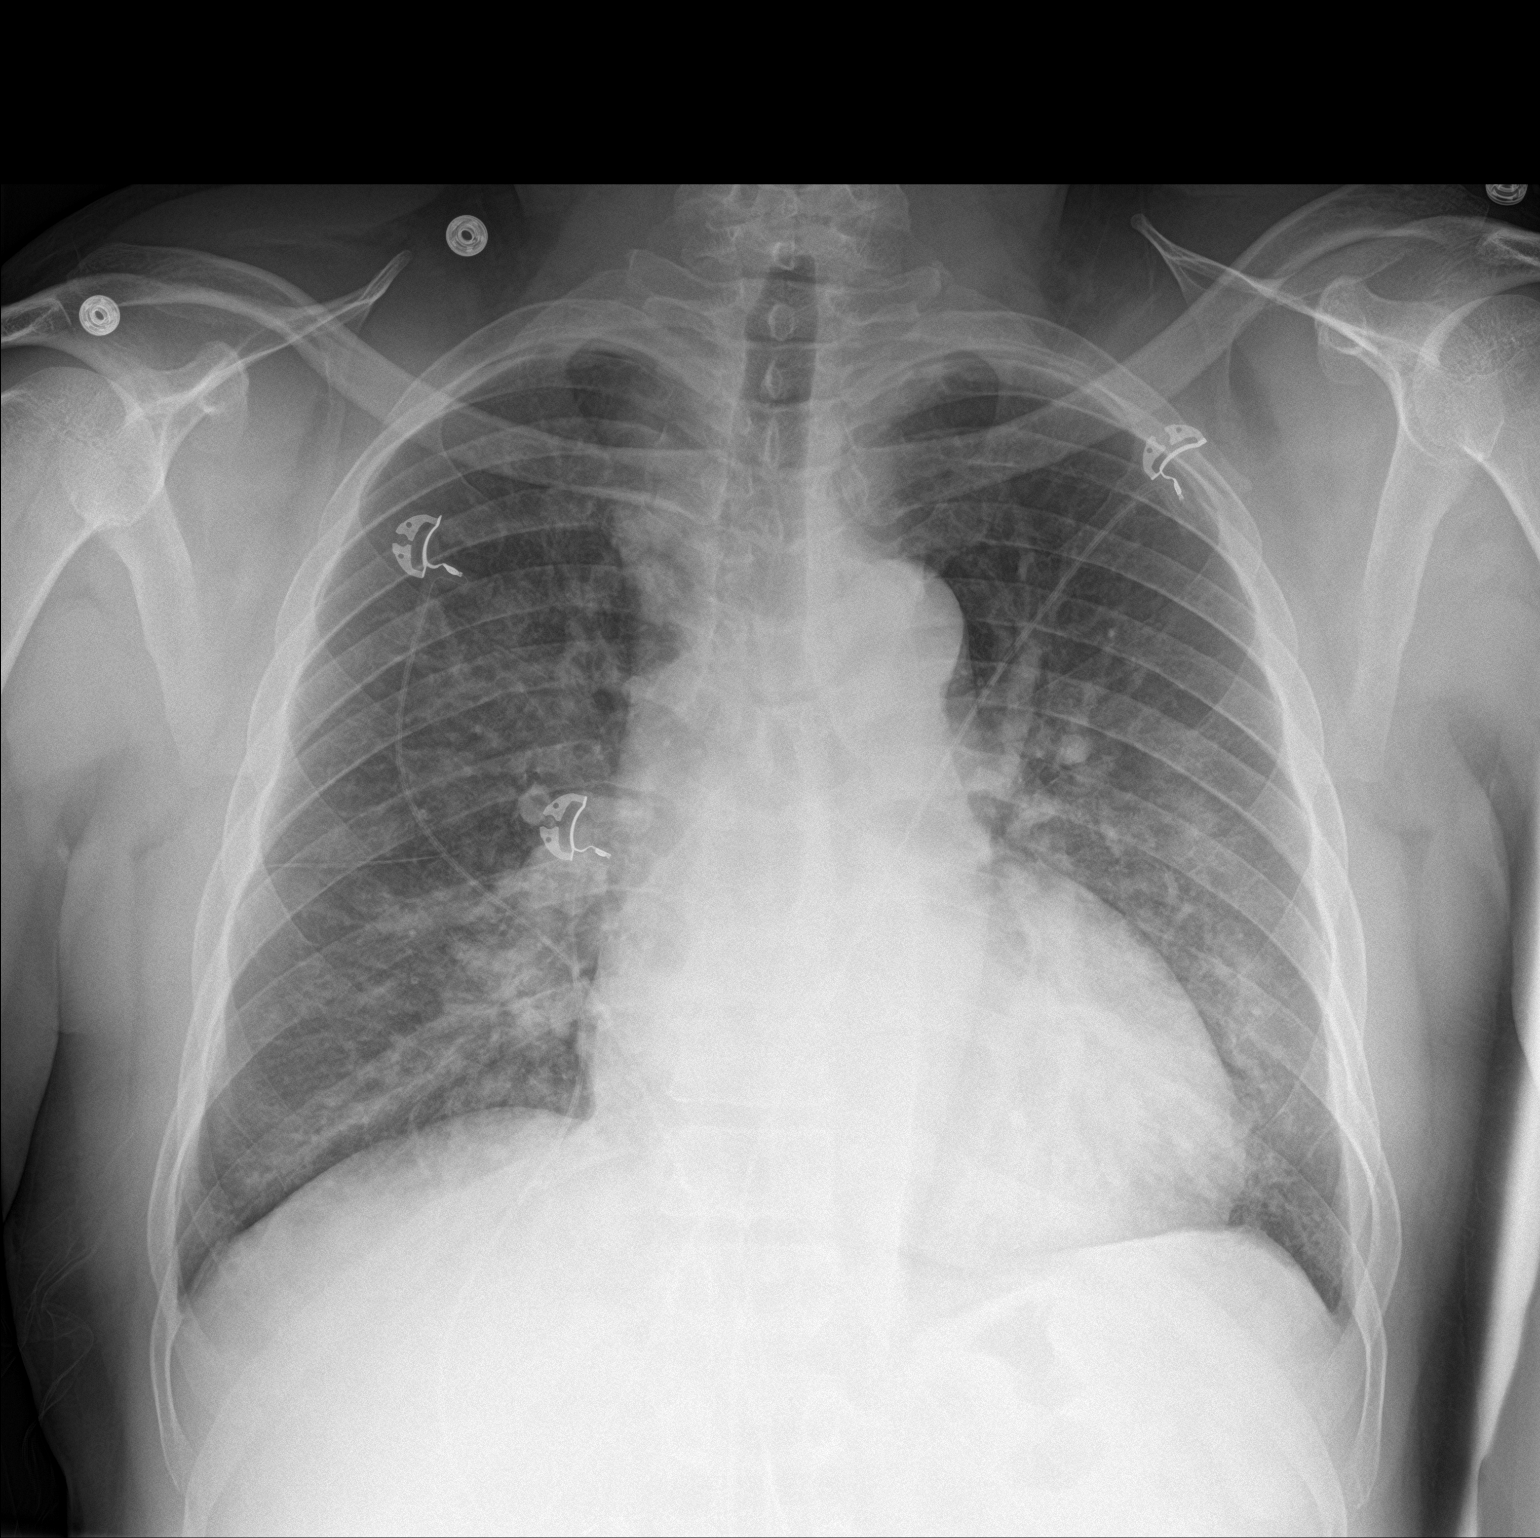

[chest lat]
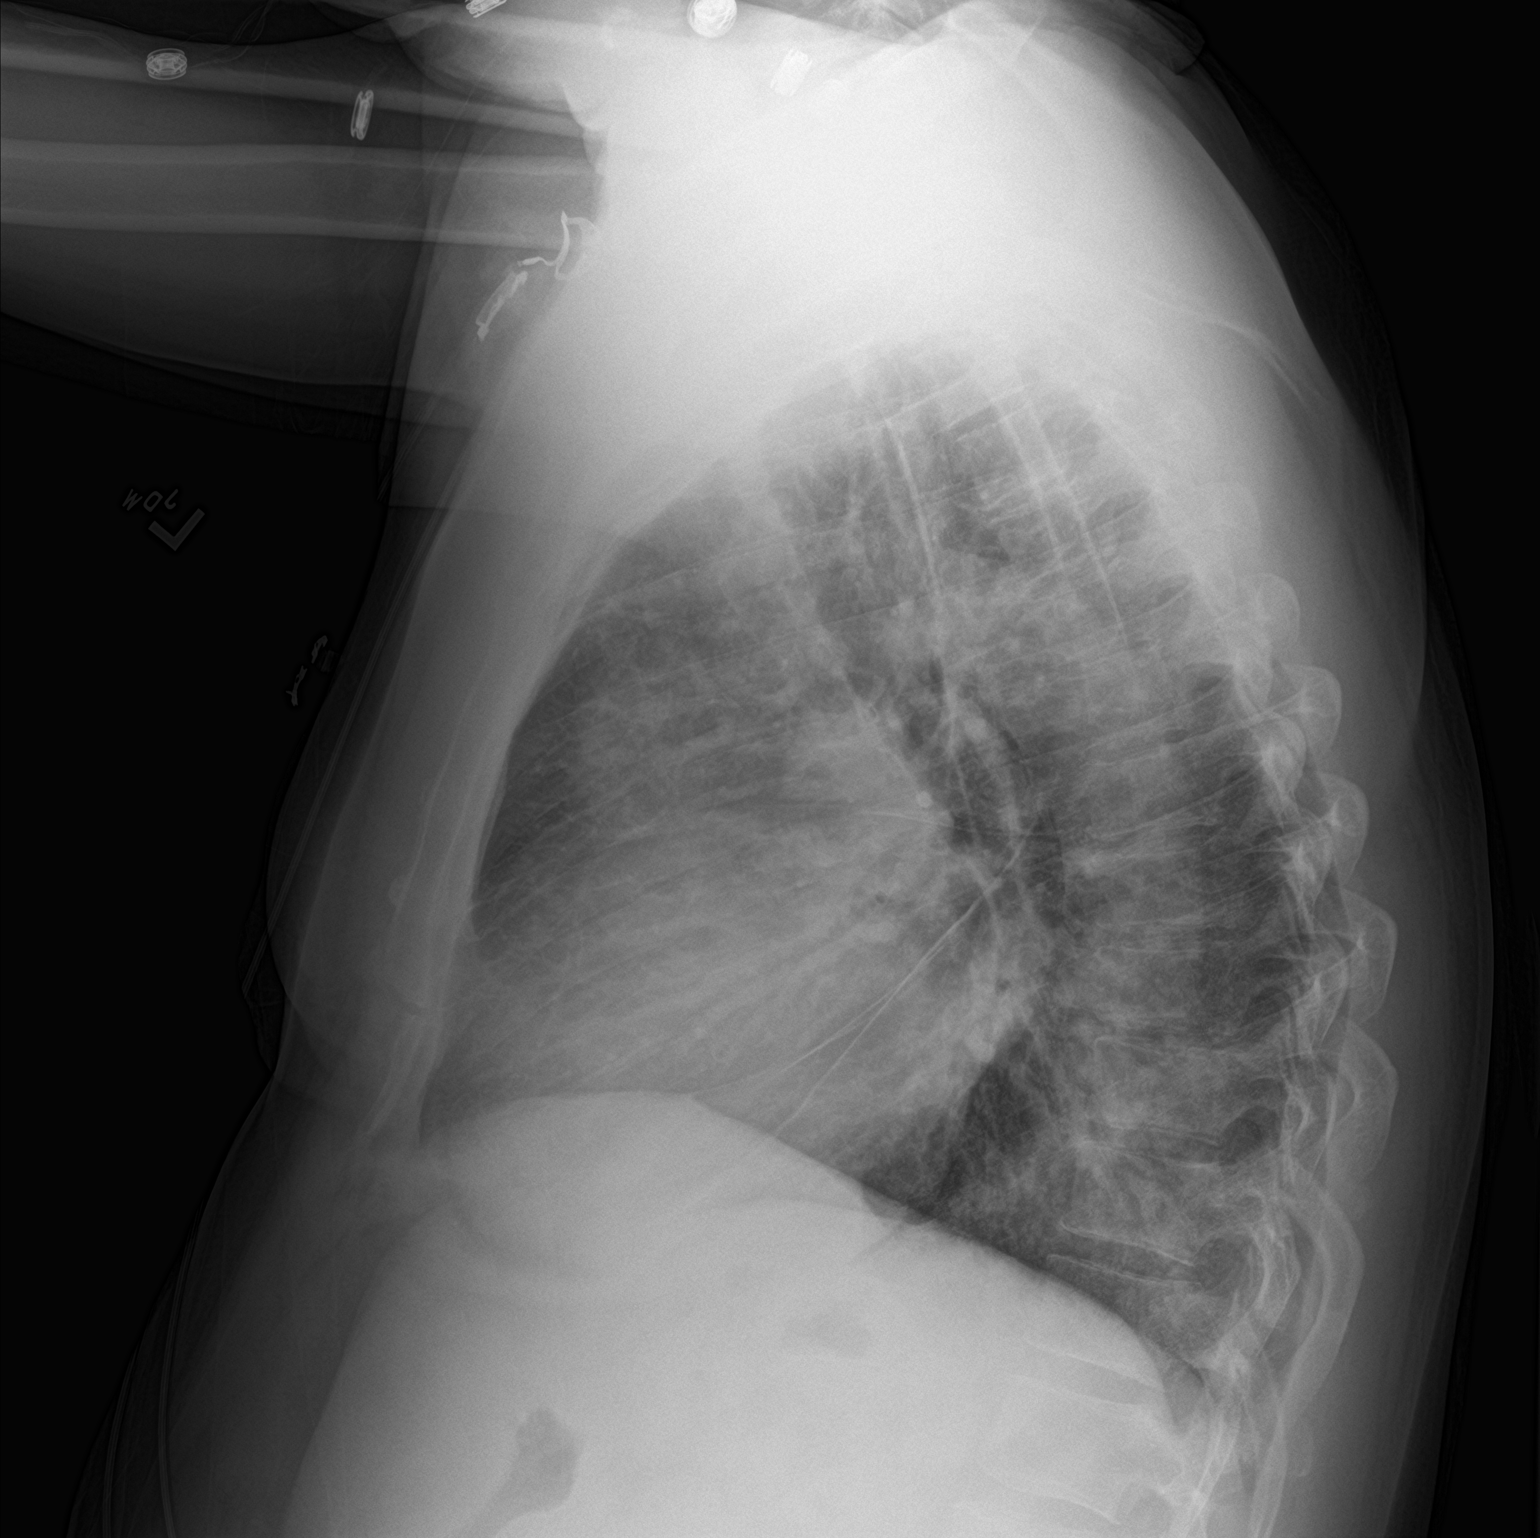

[2 of 2 positions shown; findings below may reference images not displayed]

FINDINGS: The lungs are well-aerated. Vascular congestion is noted. Increased
interstitial markings may reflect pneumonia or interstitial edema.
This is mildly improved from the recent prior study. There is no
evidence of pleural effusion or pneumothorax.

The heart is borderline normal in size. No acute osseous
abnormalities are seen.
IMPRESSION: Vascular congestion noted. Increased interstitial markings may
reflect pneumonia or interstitial edema, mildly improved from the
recent prior study.

## 2017-06-07 MED ORDER — RALTEGRAVIR POTASSIUM 400 MG PO TABS
400.0000 mg | ORAL_TABLET | Freq: Two times a day (BID) | ORAL | Status: DC
  Administered 2017-06-07 – 2017-06-08 (×2): 400 mg via ORAL
  Filled 2017-06-07 (×2): qty 1

## 2017-06-07 MED ORDER — SODIUM CHLORIDE 0.9% FLUSH: 3.0000 mL | INTRAVENOUS | Status: DC | PRN

## 2017-06-07 MED ORDER — ALLOPURINOL 100 MG PO TABS
100.0000 mg | ORAL_TABLET | Freq: Two times a day (BID) | ORAL | Status: DC
  Administered 2017-06-07 – 2017-06-08 (×2): 100 mg via ORAL
  Filled 2017-06-07 (×3): qty 1

## 2017-06-07 MED ORDER — GABAPENTIN 300 MG PO CAPS
300.0000 mg | ORAL_CAPSULE | Freq: Three times a day (TID) | ORAL | Status: DC
  Administered 2017-06-07 – 2017-06-08 (×3): 300 mg via ORAL
  Filled 2017-06-07 (×3): qty 1

## 2017-06-07 MED ORDER — DEXTROSE 5 % IV SOLN: 1.0000 g | INTRAVENOUS | Status: DC

## 2017-06-07 MED ORDER — TRIAMCINOLONE ACETONIDE 0.1 % EX CREA
1.0000 "application " | TOPICAL_CREAM | Freq: Two times a day (BID) | CUTANEOUS | Status: DC
  Administered 2017-06-08: 1 via TOPICAL
  Filled 2017-06-07: qty 15

## 2017-06-07 MED ORDER — ENOXAPARIN SODIUM 30 MG/0.3ML ~~LOC~~ SOLN
30.0000 mg | SUBCUTANEOUS | Status: DC
  Filled 2017-06-07: qty 0.3

## 2017-06-07 MED ORDER — ONDANSETRON 4 MG PO TBDP: 4.0000 mg | ORAL_TABLET | Freq: Three times a day (TID) | ORAL | Status: DC | PRN

## 2017-06-07 MED ORDER — SODIUM CHLORIDE 0.9% FLUSH
3.0000 mL | Freq: Two times a day (BID) | INTRAVENOUS | Status: DC
  Administered 2017-06-07: 3 mL via INTRAVENOUS

## 2017-06-07 MED ORDER — CLONIDINE HCL 0.1 MG PO TABS
0.1000 mg | ORAL_TABLET | Freq: Two times a day (BID) | ORAL | Status: DC
  Administered 2017-06-07 – 2017-06-08 (×3): 0.1 mg via ORAL
  Filled 2017-06-07 (×3): qty 1

## 2017-06-07 MED ORDER — CARVEDILOL 25 MG PO TABS
25.0000 mg | ORAL_TABLET | Freq: Two times a day (BID) | ORAL | Status: DC
  Administered 2017-06-07 – 2017-06-08 (×2): 25 mg via ORAL
  Filled 2017-06-07 (×2): qty 1

## 2017-06-07 MED ORDER — ALBUTEROL SULFATE (2.5 MG/3ML) 0.083% IN NEBU
5.0000 mg | INHALATION_SOLUTION | Freq: Once | RESPIRATORY_TRACT | Status: AC
  Administered 2017-06-07: 5 mg via RESPIRATORY_TRACT
  Filled 2017-06-07: qty 6

## 2017-06-07 MED ORDER — LAMIVUDINE 150 MG PO TABS
150.0000 mg | ORAL_TABLET | Freq: Every day | ORAL | Status: DC
  Administered 2017-06-07 – 2017-06-08 (×2): 150 mg via ORAL
  Filled 2017-06-07 (×2): qty 1

## 2017-06-07 MED ORDER — SPIRONOLACTONE 25 MG PO TABS
25.0000 mg | ORAL_TABLET | Freq: Every day | ORAL | Status: DC
  Administered 2017-06-07 – 2017-06-08 (×2): 25 mg via ORAL
  Filled 2017-06-07 (×2): qty 1

## 2017-06-07 MED ORDER — IPRATROPIUM BROMIDE 0.02 % IN SOLN
0.5000 mg | Freq: Once | RESPIRATORY_TRACT | Status: AC
  Administered 2017-06-07: 0.5 mg via RESPIRATORY_TRACT
  Filled 2017-06-07: qty 2.5

## 2017-06-07 MED ORDER — ABACAVIR SULFATE 300 MG PO TABS
300.0000 mg | ORAL_TABLET | Freq: Every day | ORAL | Status: DC
  Administered 2017-06-07 – 2017-06-08 (×2): 300 mg via ORAL
  Filled 2017-06-07 (×2): qty 1

## 2017-06-07 MED ORDER — ATORVASTATIN CALCIUM 40 MG PO TABS
40.0000 mg | ORAL_TABLET | Freq: Every day | ORAL | Status: DC
  Administered 2017-06-07: 40 mg via ORAL
  Filled 2017-06-07: qty 1

## 2017-06-07 MED ORDER — SODIUM CHLORIDE 0.9 % IV BOLUS (SEPSIS)
1000.0000 mL | Freq: Once | INTRAVENOUS | Status: AC
  Administered 2017-06-07: 1000 mL via INTRAVENOUS

## 2017-06-07 MED ORDER — AMLODIPINE BESYLATE 5 MG PO TABS
10.0000 mg | ORAL_TABLET | Freq: Every day | ORAL | Status: DC
  Administered 2017-06-07 – 2017-06-08 (×2): 10 mg via ORAL
  Filled 2017-06-07 (×2): qty 2

## 2017-06-07 MED ORDER — POTASSIUM CHLORIDE CRYS ER 20 MEQ PO TBCR
30.0000 meq | EXTENDED_RELEASE_TABLET | Freq: Every day | ORAL | Status: DC
  Administered 2017-06-07 – 2017-06-08 (×2): 30 meq via ORAL
  Filled 2017-06-07 (×2): qty 1

## 2017-06-07 MED ORDER — DEXTROSE 5 % IV SOLN: 2.0000 g | Freq: Once | INTRAVENOUS | Status: DC

## 2017-06-07 MED ORDER — FAMOTIDINE 20 MG PO TABS
20.0000 mg | ORAL_TABLET | Freq: Every day | ORAL | Status: DC
  Administered 2017-06-07: 20 mg via ORAL
  Filled 2017-06-07: qty 1

## 2017-06-07 MED ORDER — FUROSEMIDE 10 MG/ML IJ SOLN
40.0000 mg | Freq: Two times a day (BID) | INTRAMUSCULAR | Status: DC
  Administered 2017-06-07 – 2017-06-08 (×2): 40 mg via INTRAVENOUS
  Filled 2017-06-07 (×2): qty 4

## 2017-06-07 MED ORDER — VANCOMYCIN HCL IN DEXTROSE 1-5 GM/200ML-% IV SOLN: 1000.0000 mg | INTRAVENOUS | Status: DC

## 2017-06-07 MED ORDER — TRAMADOL HCL 50 MG PO TABS: 50.0000 mg | ORAL_TABLET | Freq: Two times a day (BID) | ORAL | Status: DC | PRN

## 2017-06-07 MED ORDER — VANCOMYCIN HCL IN DEXTROSE 1-5 GM/200ML-% IV SOLN: 1000.0000 mg | Freq: Once | INTRAVENOUS | Status: DC

## 2017-06-07 MED ORDER — HYDRALAZINE HCL 50 MG PO TABS
100.0000 mg | ORAL_TABLET | Freq: Three times a day (TID) | ORAL | Status: DC
  Administered 2017-06-07 – 2017-06-08 (×3): 100 mg via ORAL
  Filled 2017-06-07 (×3): qty 2

## 2017-06-07 MED ORDER — SODIUM CHLORIDE 0.9 % IV SOLN
1750.0000 mg | Freq: Once | INTRAVENOUS | Status: DC
  Administered 2017-06-07: 1750 mg via INTRAVENOUS
  Filled 2017-06-07: qty 1750

## 2017-06-07 MED ORDER — SODIUM CHLORIDE 0.9 % IV SOLN: 250.0000 mL | INTRAVENOUS | Status: DC | PRN

## 2017-06-07 MED ORDER — SODIUM CHLORIDE 0.9% FLUSH
3.0000 mL | Freq: Two times a day (BID) | INTRAVENOUS | Status: DC
  Administered 2017-06-08: 3 mL via INTRAVENOUS

## 2017-06-07 NOTE — H&P (Signed)
Date: 06/07/2017               Patient Name:  Albert Eaton MRN: [id number]  DOB: [birth year] Age / Sex: 51 y.o., male   PCP: [name], FNP              Medical Service: Internal Medicine Teaching Service              Attending Physician: Dr. Oval Linsey, MD    First Contact: Will Jerline Pain, MS III Pager: [phone]  Second Contact: Dr. Thomasene Ripple Pager: [phone]  Third Contact Dr. Maryellen Pile Pager: [phone]       After Hours (After 5p/  First Contact Pager: [phone]  weekends / holidays): Second Contact Pager: [phone]   Chief Complaint: Shortness of breath and cough  History of Present Illness: Rahkim Bonawitz is a 51 year-old man with a past medical history of CHF, HTN, CKD stage IV, OSA and HIV (last CD4 count 690 on 7/30) who presents after waking up feeling short of breath this morning. He states that he awoke at 0200 this morning with the feeling that he could not catch his breath. This was associated with a cough productive of blood-tinged sputum and wheezing. He stated that his PCP reduced his dose of furosemide recently (from 60 mg BID to 30 mg qd per chart review) and that he feels like he has put on some fluid. His left ankle has begun swelling and he reported gaining about 10 lbs compared to his baseline. He feels like his abdomen is more distended than normal. He has not felt more short of breath with ambulation recently, and stated that he is usually able to get up a flight of stairs without becoming short of breath. He generally sleeps on two pillows, and has woken up gasping for breath several times in the past, although he could not say how often. Upon arrival to the ED, he reported that he had measured a temperature of 102 Fahrenheit, but he denied any subjective fevers in the past week. He denies chest pain, nausea/vomiting, changes in urination or bowel movements, abdominal pain or chills. He does not sleep with a CPAP for his OSA due to claustrophobia.  He reports that he takes all of his medications as prescribed.  About a week ago, Mr. Westerfield was seen in the [facility] with the complaint of flank pain, urinary frequency and nausea. A CT scan of the abdomen showed no abnormalities. A UA was positive, and cultured showed Klebsiella susceptible to Augmentin, cefazolin, ceftriaxone, cipro, ESBL, gentamicin, imipenem, Zosyn and Bactrim. He was sent home with a regimen of Keflex, which he has not yet finished taking. At his follow-up visit, he was prescribed prednisone 20 mg to take for irritant contact dermatitis on his axillae. He was given 18 doses to take in 9 days with a taper of 3 pills for 3 days, 2 pills for 3 days and finally 1 pill for 3 days.   Mr. Bossard has been followed by Dr. [name] in the [place] practice. At his most recent visit (05/19/17), he was seen for a follow-up visit to a hospitalization in 12/17 for hypertensive emergency and elevated troponins. An echocardiogram from that hospitalization showed preserved LV function (EF not reported). During this office visit, he was told to continue with his medication regimen and follow-up in 6 months for an echocardiogram.   Meds: Current Facility-Administered Medications  Medication Dose Route Frequency Provider Last Rate Last  Dose  . amLODipine (NORVASC) tablet 10 mg  10 mg Oral Daily Maryellen Pile, MD      . cloNIDine (CATAPRES) tablet 0.1 mg  0.1 mg Oral BID Maryellen Pile, MD      . furosemide (LASIX) injection 40 mg  40 mg Intravenous BID Maryellen Pile, MD      . hydrALAZINE (APRESOLINE) tablet 100 mg  100 mg Oral TID Maryellen Pile, MD      . spironolactone (ALDACTONE) tablet 25 mg  25 mg Oral Daily Maryellen Pile, MD       Current Outpatient Prescriptions  Medication Sig Dispense Refill  . abacavir (ZIAGEN) 300 MG tablet TAKE 1 TABLET BY MOUTH DAILY 30 tablet 3  . allopurinol (ZYLOPRIM) 100 MG tablet Take 1 tablet (100 mg total) by mouth 2 (two)  times daily. 60 tablet 5  . amLODipine (NORVASC) 10 MG tablet Take 1 tablet (10 mg total) by mouth daily. 30 tablet 1  . atorvastatin (LIPITOR) 40 MG tablet Take 1 tablet (40 mg total) by mouth daily. 30 tablet 1  . carvedilol (COREG) 25 MG tablet Take 1 tablet (25 mg total) by mouth 2 (two) times daily with a meal. 60 tablet 11  . cephALEXin (KEFLEX) 500 MG capsule Take 1 capsule (500 mg total) by mouth 2 (two) times daily. 20 capsule 0  . cloNIDine (CATAPRES) 0.1 MG tablet Take 1 tablet (0.1 mg total) by mouth 2 (two) times daily. 60 tablet 11  . famotidine (PEPCID) 20 MG tablet Take 1 tablet (20 mg total) by mouth 2 (two) times daily. 30 tablet 11  . furosemide (LASIX) 20 MG tablet Take 1.5 tablets (30 mg total) by mouth daily. 30 tablet 0  . gabapentin (NEURONTIN) 300 MG capsule Take 1 capsule (300 mg total) by mouth 3 (three) times daily. 90 capsule 3  . hydrALAZINE (APRESOLINE) 100 MG tablet Take 1 tablet (100 mg total) by mouth 3 (three) times daily. 90 tablet 11  . ISENTRESS 400 MG tablet TAKE 1 TABLET BY MOUTH TWICE DAILY (Patient taking differently: TAKE 400 mg BY MOUTH TWICE DAILY) 60 tablet 3  . lamiVUDine (EPIVIR) 150 MG tablet TAKE 1 TABLET BY MOUTH DAILY 30 tablet 3  . levocetirizine (XYZAL) 5 MG tablet Take 1 tablet (5 mg total) by mouth every evening. 30 tablet 1  . ondansetron (ZOFRAN ODT) 4 MG disintegrating tablet 3m ODT q4 hours prn nausea/vomit 4 tablet 0  . potassium chloride SA (K-DUR,KLOR-CON) 20 MEQ tablet Take 30 mEq by mouth daily.    . predniSONE (DELTASONE) 20 MG tablet Take 3 PO QAM x3days, 2 PO QAM x3days, 1 PO QAM x3days 18 tablet 0  . spironolactone (ALDACTONE) 25 MG tablet Take 25 mg by mouth daily.    . traMADol (ULTRAM) 50 MG tablet Take 1 tablet (50 mg total) by mouth every 6 (six) hours as needed. (Patient taking differently: Take 50 mg by mouth every 6 (six) hours as needed for moderate pain. ) 15 tablet 0  . triamcinolone cream (KENALOG) 0.1 % Apply 1  application topically 2 (two) times daily. 30 g 0  . zinc gluconate 50 MG tablet Take 100 mg by mouth daily.      Allergies: Allergies as of 06/07/2017 - Review Complete 06/07/2017  Allergen Reaction Noted  . Lisinopril Cough 03/18/2017   Past Medical History:  Diagnosis Date  . Chronic diastolic CHF [place]estive [place]) [place] 01/06/2017   [facility] [place] Mild AI, mild LAE,  mild concentric LVH, EF 55, normal wall motion, mild to moderate MR, mild PI, RVSP 55 // Echo 10/09/16 ([facility]):  Moderate LVH, grade 2 diastolic dysfunction, mild MR, moderate LAE   . CKD (chronic kidney disease)   . Gout   . History of nuclear stress test    a. Nuc study 7/16: no scar or ischemia, EF 42 // b. Nuc study 12/17: EF 48, ?small apical ischemia, Low Risk  . HIV (human immunodeficiency virus infection) ([place])   . HLD (hyperlipidemia)   . Hypertensive heart disease with CHF (congestive heart failure) ([place]) 10/09/2016  . OSA (obstructive sleep apnea)    Past Surgical History:  Procedure Laterality Date  . CHOLECYSTECTOMY    . HERNIA REPAIR     As baby   Family History  Problem Relation Age of Onset  . Heart failure Mother   . Heart attack Maternal Grandmother 53  . Hypertension Sister   . Hypertension Brother    Social History   Social History  . Marital status: Unknown    Spouse name: N/A  . Number of children: N/A  . Years of education: N/A   Occupational History  . Not on file.   Social History Main Topics  . Smoking status: Former Smoker    Quit date: 2000  . Smokeless tobacco: Former Systems developer    Quit date: 09/10/1999  . Alcohol use 8.4 oz/week    14 Shots of liquor per week     Comment: Light use  . Drug use: No  . Sexual activity: No     Comment: declined condoms   Other Topics Concern  . Not on file   Social History Narrative  . No narrative on file    Review of Systems: Pertinent items are noted in HPI.  Physical Exam: Blood pressure  (!) 192/113, pulse 97, temperature 100.1 F (37.8 C), temperature source Oral, resp. rate (!) 25, height 5' 10"  (1.778 m), weight 98.9 kg (218 lb), SpO2 93 %. General appearance: alert, cooperative and no distress Lungs: extensive crackles bilaterally Heart: systolic murmur: holosystolic 3/6, blowing at apex and otherwise normal heart sounds, normal pulses, JVD present with hepatojugular reflex Abdomen: soft, nontender, slightly distended abdomen Extremities: edema on left ankle, nonpitting  Lab results: I-stat lactic acid (1002): 2.28 CMP: Na+ 137, K+ 3.6, Cl- 106, CO2 22, glucose 94, BUN 33, Cr 3.43, Ca2+ 9.7, total protein 7.1, albumin 3.6, AST 17, ALT 16, ALP 56, tbili 0.3, anion gap 13. CBC with diff: WBC 14.3, RBC 3.76, Hgb 11.6, HCT 32.6, MCV 86.7, platelets 332. ANC 9.8, absolute monocytes 1.3. BNP: 1,059.2 I-stat lactic acid (1222): 2.10 Procalcitonin: 0.21  Imaging results:  Dg Chest Port 1 View  Result Date: 06/07/2017 CLINICAL DATA:  Short of breath, fever, cough EXAM: PORTABLE CHEST 1 VIEW COMPARISON:  05/31/2017 FINDINGS: Mild cardiomegaly. Central basilar airspace opacities are confluent. Vascular congestion. No pneumothorax or pleural effusion. IMPRESSION: Cardiomegaly and bilateral airspace disease. Differential diagnosis includes bilateral pneumonia, ARDS, and pulmonary edema. Electronically Signed   By: Marybelle Killings M.D.   On: 06/07/2017 10:17    Other results: EKG: Sinus tachycardia, left ventricular hypertrophy, possible left atrial enlargement.  Assessment & Plan by Problem: Active Problems:   SOB (shortness of breath)  Mr. Sauls is a 51 year old man with a past medical history of CHF, HTN, stage IV CKD, HIV and OSA who presents after an episode of shortness of breath causing an awakening from sleep in the setting of volume overload.  Shortness of breath in setting of volume overload: Mr. Ferger shortness of breath this morning happened suddenly while he was  asleep and resolved shortly thereafter. This clinical presentation fits with paroxysmal nocturnal dyspnea, an abrupt wakening from sleep with gasping for air that often accompanies CHF. This, along with his history of CHF (with preserved EF by all accounts), and his volume overloaded presentation on exam (elevated JVD, ankle edema, abdominal distension), indicates that CHF is the most likely cause of his shortness of breath. Although he has had CHF for more than a year at this point, he does not seem to regularly experience DOE. His most recent echo showed preserved EF (although the number was not reported). Also, the decrease in his lasix dose from 60 mg BID to 30 mg qd might have led to volume overload and CHF exacerbation. He reported that his dry weight is usually around 208 lbs, and he was measured to be 218 lbs today. Alternatively, his shortness of breath may be due to a pneumonia. This is evidenced by his report of cough producing sputum as well his fever. However, his CXR did not show any signs of a consolidation, and instead showed pulmonary edema more indicative of CHF. Also, the leukocytosis on CBC may be due to the steroid taper he has been taking for his irritant contact dermatitis. - repeat echocardiogram - start on IV lasix 40 mg BID - daily weights - monitor Is and Os  Isolated fever: Mr. Couey reported a fever of 102 when he checked it this morning. In the ED (after 3 Tylenol), his temperature was 101.2. It is unclear what the source of the fever could be, as CXR showed no signs of acute pneumonia or pleural effusion. It may be that his fever is related to the UTI he had last week, and that that might not have cleared all the way. Because he has remained stable and his presentation seems more likely to be due to volume overload due to CHF exacerbation, the antibiotics given to him in the ED (vancomycin and cefepime) were discontinued. - f/u on blood cultures - continue to monitor   HTN:  He has been hypertensive since admission in the range of 181-213/113-138. He did not take his home BP medications this morning, and those will be restarted. - continue home hydralazine 100 mg TID, clonidine 0.1 mg BID, amlodipine 10 mg daily  Stage IV CKD: BUN 33 and Cr 3.43 on admission. Experienced increase in Cr (to 4.67) during UTI last week. Continues to make urine, and notes that he often has to go to the bathroom several times each night. He has been informed about nephrotoxic medications and that he should avoid them. His home lasix dose of 60 mg BID was lowered to 30 mg once daily to protect his kidneys. He has shown reluctance to follow-up with nephrology due to cost and insurance status. - continue to monitor  OSA: Not on CPAP at home because of claustrophobia. - consider nasal attachment CPAP  HIV disease: Well controlled. Most recent CD4 count 690 and viral load undetectable. - continue current regimen of lamivudine 150 mg qd and abacavir 300 mg qd  This is a Careers information officer Note.  The care of the patient was discussed with Dr. Berneice Gandy and the assessment and plan was formulated with their assistance.  Please see their note for official documentation of the patient encounter.   Signed: Wynona Meals, Medical Student 06/07/2017, 2:00 PM

## 2017-06-07 NOTE — ED Triage Notes (Addendum)
Pt in from home after waking at 0200 with sob, fever and cough. Has been on course of Keflex for UTI, has 2 days left. Pt took 3 Tylenol PTA. A&ox4, able to speak in short sentences, sats 93% on RA, 36 RR BP 202/125. Hx of CHF, HIV and HTN. Increased swelling on ankles per pt. Denies cp, n/v/d

## 2017-06-07 NOTE — H&P (Signed)
Date: 06/07/2017               Patient Name:  Al[facility] Eaton MRN: [id number]  DOB: Oct 05, 1966 Age / Sex: 51 y.o., male   PCP: [name], FNP         Medical Service: Internal Medicine Teaching Service         Attending Physician: Dr. Oval Linsey, MD    First Contact: Will Jerline Pain, MS3 Pager: [phone]  Second Contact: Third Contact: Dr. Thomasene Ripple Dr. Maryellen Pile Pager: Pager: [phone]       After Hours (After 5p/  First Contact Pager: [phone]  weekends / holidays): Second Contact Pager: [phone]   Chief Complaint: Cough, wheezing, and SOB  History of Present Illness: Albert Eaton is a 51 yo with a PMH well controlled HIV (7/30 CD4 count = 690), CHF, HTN, stage IV CKD, and OSA (not on therapy) who presented to the ED after waking up this morning with a cough and shortness of breath. This cough was associated with a yellow, blood tinged sputum. He also endorsed wheezing during this time. He denies chest pain, nausea, vomiting, abdominal pain, pain with urination, headaches, and dizziness. He states that he had a similar episode last week where he woke up coughing and short of breath. He noticed recently that he has been gaining some weight, mainly in his abdomen where he normally carries his fluid. He states that he hasn't noticed too much swelling in his legs. He weighs himself at home and thinks that he has gained a few pounds recently and he noticed that his pants are fitting more snuggly.   The patient was seen in the ED six days ago. At that time he endorsed nausea, fever, myalgias, and abdominal pain. He also had dysuria and his urinalysis was consistent with infection. The patient was started on Keflex and has been taking this for the past few days. Per chart review his urine taken on that admission grew >100,000 Klebsiella which was sensitive to cephalosporins. He also had blood cultures taken at that time and they were no growth at 5 days.   The  patient was recently seen by his PCP after his ED visit where he was diagnosed with a UTI. His lasix regimen was changed 2/2 an acute on chronic kidney injury that was noticed in the ED. At this visit he was told to take lasix 30 mg daily rather than his previously prescribed 60 mg BID to help improve his AKI. Also at that visit the patient complained of a continued rash on bilateral axillae that was thought to be 2/2 contact dermatitis. He was told to start taking prednisone 60 mg for 3 days, followed by 40 mg for 3 days, and 20 mg for edays (estimated stop date around 8/17).   Meds:  Current Meds  Medication Sig  . abacavir (ZIAGEN) 300 MG tablet TAKE 1 TABLET BY MOUTH DAILY  . allopurinol (ZYLOPRIM) 100 MG tablet Take 1 tablet (100 mg total) by mouth 2 (two) times daily.  Marland Kitchen amLODipine (NORVASC) 10 MG tablet Take 1 tablet (10 mg total) by mouth daily.  Marland Kitchen atorvastatin (LIPITOR) 40 MG tablet Take 1 tablet (40 mg total) by mouth daily.  . carvedilol (COREG) 25 MG tablet Take 1 tablet (25 mg total) by mouth 2 (two) times daily with a meal.  . cephALEXin (KEFLEX) 500 MG capsule Take 1 capsule (500 mg total) by mouth 2 (two) times daily.  . cloNIDine (  CATAPRES) 0.1 MG tablet Take 1 tablet (0.1 mg total) by mouth 2 (two) times daily.  . famotidine (PEPCID) 20 MG tablet Take 1 tablet (20 mg total) by mouth 2 (two) times daily.  . furosemide (LASIX) 20 MG tablet Take 1.5 tablets (30 mg total) by mouth daily.  Marland Kitchen gabapentin (NEURONTIN) 300 MG capsule Take 1 capsule (300 mg total) by mouth 3 (three) times daily.  . hydrALAZINE (APRESOLINE) 100 MG tablet Take 1 tablet (100 mg total) by mouth 3 (three) times daily.  . ISENTRESS 400 MG tablet TAKE 1 TABLET BY MOUTH TWICE DAILY (Patient taking differently: TAKE 400 mg BY MOUTH TWICE DAILY)  . lamiVUDine (EPIVIR) 150 MG tablet TAKE 1 TABLET BY MOUTH DAILY  . levocetirizine (XYZAL) 5 MG tablet Take 1 tablet (5 mg total) by mouth every evening.  . ondansetron  (ZOFRAN ODT) 4 MG disintegrating tablet 71m ODT q4 hours prn nausea/vomit  . potassium chloride SA (K-DUR,KLOR-CON) 20 MEQ tablet Take 30 mEq by mouth daily.  . predniSONE (DELTASONE) 20 MG tablet Take 3 PO QAM x3days, 2 PO QAM x3days, 1 PO QAM x3days  . spironolactone (ALDACTONE) 25 MG tablet Take 25 mg by mouth daily.  . traMADol (ULTRAM) 50 MG tablet Take 1 tablet (50 mg total) by mouth every 6 (six) hours as needed. (Patient taking differently: Take 50 mg by mouth every 6 (six) hours as needed for moderate pain. )  . triamcinolone cream (KENALOG) 0.1 % Apply 1 application topically 2 (two) times daily.  .Marland Kitchenzinc gluconate 50 MG tablet Take 100 mg by mouth daily.  . [DISCONTINUED] miconazole (MICRO GUARD) 2 % powder Apply 1 application topically daily as needed for itching.   Allergies: Allergies as of 06/07/2017 - Review Complete 06/07/2017  Allergen Reaction Noted  . Lisinopril Cough 03/18/2017   Past Medical History: Past Medical History:  Diagnosis Date  . Chronic diastolic CHF (congestive heart failure) ([facility] 01/06/2017   [facility] [place] Mild AI, mild LAE, mild concentric LVH, EF 55, normal wall motion, mild to moderate Albert, mild PI, RVSP 55 // Echo 10/09/16 ([facility]):  Moderate LVH, grade 2 diastolic dysfunction, mild Albert, moderate LAE   . CKD (chronic kidney disease)   . Gout   . History of nuclear stress test    a. Nuc study 7/16: no scar or ischemia, EF 42 // b. Nuc study 12/17: EF 48, ?small apical ischemia, Low Risk  . HIV (human immunodeficiency virus infection) ([facility]   . HLD (hyperlipidemia)   . Hypertensive heart disease with CHF (congestive heart failure) ([facility] 10/09/2016  . OSA (obstructive sleep apnea)    Past surgical History: Past Surgical History:  Procedure Laterality Date  . CHOLECYSTECTOMY    . HERNIA REPAIR     As baby   Family History:  Family History  Problem Relation Age of Onset  . Heart failure Mother   . Heart attack Maternal  Grandmother 53  . Hypertension Sister   . Hypertension Brother    Social History:  Patient lives with two nephews and does not currently work. He doesn't smoke or use smokeless tobacco products. Denies drug use and drinks "a few drinks per day". States that he is not currently sexually active, but previously had sex with men. Denies any STI except for HIV.   Review of Systems: A complete ROS was negative except as per HPI.   Physical Exam: Blood pressure (!) 187/123, pulse 94, temperature 100.1 F (37.8 C), temperature  source Oral, resp. rate (!) 26, height 5' 10"  (1.778 m), weight 218 lb (98.9 kg), SpO2 92 %.  Physical Exam  Constitutional: He is well-developed, well-nourished, and in no distress. No distress.  Cardiovascular: Normal rate and intact distal pulses.   Murmur (holosystolic murmur heard best at left axilla) heard. JVD to mid neck present.  Pulmonary/Chest: Effort normal. No respiratory distress.  Crackles heard bilaterally up to mid-back.  Abdominal: Soft. He exhibits no distension. There is no tenderness. There is no rebound and no guarding.  Musculoskeletal: He exhibits edema (trace pitting edema to mid shins bilaterally). He exhibits no tenderness (of bilateral lower extremities).  Neurological:  Gross motor and sensation intact.  Skin: Skin is warm. No rash noted. He is diaphoretic (on forehead). No erythema.   CXR: personally reviewed my interpretation is bilateral pulmonary infiltrates  BMP: BMP Latest Ref Rng & Units 06/07/2017 06/01/2017 06/01/2017  Glucose 65 - 99 mg/dL 94 98 105(H)  BUN 6 - 20 mg/dL 33(H) 36(H) 35(H)  Creatinine 0.61 - 1.24 mg/dL 3.43(H) 4.80(H) 4.67(H)  BUN/Creat Ratio 9 - 20 - - -  Sodium 135 - 145 mmol/L 137 140 138  Potassium 3.5 - 5.1 mmol/L 3.6 3.2(L) 3.4(L)  Chloride 101 - 111 mmol/L 102 102 103  CO2 22 - 32 mmol/L 22 - 21(L)  Calcium 8.9 - 10.3 mg/dL 9.7 - 9.1   CBC: CBC Latest Ref Rng & Units 06/07/2017 06/01/2017 06/01/2017  WBC 4.0  - 10.5 K/uL 14.3(H) - 12.7(H)  Hemoglobin 13.0 - 17.0 g/dL 11.6(L) 11.6(L) 11.7(L)  Hematocrit 39.0 - 52.0 % 32.6(L) 34.0(L) 33.7(L)  Platelets 150 - 400 K/uL 332 - 238   Assessment & Plan by Problem: Active Problems:   SOB (shortness of breath)  SOB with presumed acute exacerbation of CHF: The patient's symptoms of fever, SOB, and productive cough were concerning for viral URI or pneumonia. A chest Xray on admission showed bilateral opacities with vascular congestion, which has a broad differential ranging from bilateral pneumonia to ARDS and pulmonary edema. The ED was concerned for bilateral pneumonia given the patient's fever on presentation. He was given a dose of vancomycin in ED for presumed pneumonia. His procalcitonin on admission was 0.21, which is not consistent with a pneumonia diagnosis. Upon further consideration of the patient's symptoms, which include orthopnea, increasing abdominal girth, and some lower extremity swelling, a diagnosis of acute on chronic CHF exacerbation should be considered. This diagnosis is more consistent with the patient's elevated BNP (=1059), crackles, and pitting edema on exam. This is also more likely given that his PCP recently reduced his dose of daily lasix 2/2 acute kidney injury in the setting of a UTI on 8/9. The patient will get IV diuresis and we will continue to monitor his symptoms with this regimen.  -Discontinue vancomycin/cefepimine orderedin ED -Obtain echocardiogram to assess LV function and EF -IV Lasix 40 mg BID -BMP qAM while on IV lasix -Recheck procalcitonin 48 hours after admission -Continue home carvedilol 25 mg BID and sprinolactone 25 mg daily  Recent Hx of UTI in setting of CKD stage IV: The patient has been taking Keflex as an outpatient for a previous urine culture which grew >100,000 Klebsiella. He denies an urinary symptoms today, stating that he has felt better since taking the medicine.  -Will obtain urinalysis and culture on  admission -Cr on admission 3.43 with baseline at 2.9-3.5; decreased from ED visit on 8/6  HTN with CKD stage IV: The patient's BP was elevated in  the ED, however the patient has not taken his medication today. He also appears volume overloaded on exam, which would cause increased BP. Will continue to monitor during hospitalization. -Home amlodipine 10 mg daily and clonidine 0.19m BID -Cr on admission 3.43 with baseline at 2.9-3.5; decreased from ED visit on 8/6  Isolated fever with leukocytosis: Patient reported home temperature to 102 in ED. He took Tylenol prior to admission and his temperature on arrival was 100.1. Because the patient has HIV, we need to make sure that he does not have opportunistic infection. This is less likely given his well controlled HIV. His chest Xray is most consistent with CHF exacerbation, however the image is also consistent with a PJP pneumonia. Will have to consider this if he does not improve with IV lasix. The patient was given prednisone by PCP at last visit on 8/9, which would explain his leukocytosis. This may also cause a relative immune-suppression compared to his baseline. While he looks clinically stable now, will continue to monitor fever curve and infectious studies while inpatient.  -Follow up blood cultures x 2  -Lactic acid on admission 2.28 -> 2.10 in two hours with >1L fluid per sepsis protocol. [ ]  F/u lactic acid in AM  HIV: Last viral load undetectable in 04/2017 with a CD4 count >600, therefore not at significant risk for opportunistic infection, but will continue to monitor.  -Continue home abacavir 300 mg, lamivudine 150 mg, and raltegravir 400 mg BID  FEN/GI: -Check Mg in morning -Home KDur 30 mEq qDaily -Heart healthy diet  VTE prophylaxis: -Lovenox 30 mg daily  Dispo: Admit patient to Observation with expected length of stay less than 2 midnights.  Signed:Thomasene Ripple MD 06/07/2017, 2:19 PM  Pager: [phone]

## 2017-06-07 NOTE — ED Provider Notes (Signed)
[facility] DEPT Provider Note   CSN: [id number] Arrival date & time: 06/07/17  0915     History   Chief Complaint Chief Complaint  Patient presents with  . Shortness of Breath  . Congestive Heart Failure    HPI Albert Eaton is a 51 y.o. male.  HPI  Pt with hx of CHF, CKD, HIV, HTN presenting with c/o cough and fever which began last night.  He is currently on keflex for UTI- and has not quite finished the keflex.  He was feeling well until last night and began coughing and wheezing during the night approx 2am.  Fever began just prior to coming to the ED - was 102 and he took some tylenol.  No nausea or vomtiing, no chest pain.  There are no other associated systemic symptoms, there are no other alleviating or modifying factors.   Past Medical History:  Diagnosis Date  . Chronic diastolic CHF (congestive heart failure) ([place]) 01/06/2017   Echo 7/16 [facility] in [place], [name]) Mild AI, mild LAE, mild concentric LVH, EF 55, normal wall motion, mild to moderate MR, mild PI, RVSP 55 // Echo 10/09/16 ([facility]):  Moderate LVH, grade 2 diastolic dysfunction, mild MR, moderate LAE   . CKD (chronic kidney disease)   . Gout   . History of nuclear stress test    a. Nuc study 7/16: no scar or ischemia, EF 42 // b. Nuc study 12/17: EF 48, ?small apical ischemia, Low Risk  . HIV (human immunodeficiency virus infection) ([place])   . HLD (hyperlipidemia)   . Hypertensive heart disease with CHF (congestive heart failure) ([name]) 10/09/2016  . OSA (obstructive sleep apnea)     Patient Active Problem List   Diagnosis Date Noted  . Arthritis, gouty 01/27/2017  . Chronic diastolic CHF (congestive heart failure) ([place]) 01/06/2017  . OSA (obstructive sleep apnea) 01/06/2017  . Abnormal electrocardiogram (ECG) (EKG) 10/31/2016  . Chest pain 10/09/2016  . HIV disease ([place]) 10/09/2016  . CKD stage 4 secondary to hypertension ([place]) 10/09/2016  . Hypertensive heart disease with CHF  (congestive heart failure) ([place]) 10/09/2016    Past Surgical History:  Procedure Laterality Date  . CHOLECYSTECTOMY    . HERNIA REPAIR     As baby       Home Medications    Prior to Admission medications   Medication Sig Start Date End Date Taking? Authorizing Provider  abacavir (ZIAGEN) 300 MG tablet TAKE 1 TABLET BY MOUTH DAILY 05/04/17  Yes Campbell Riches, MD  allopurinol (ZYLOPRIM) 100 MG tablet Take 1 tablet (100 mg total) by mouth 2 (two) times daily. 01/02/17  Yes [name], FNP  amLODipine (NORVASC) 10 MG tablet Take 1 tablet (10 mg total) by mouth daily. 10/31/16  Yes [name], FNP  atorvastatin (LIPITOR) 40 MG tablet Take 1 tablet (40 mg total) by mouth daily. 10/31/16  Yes [name], FNP  carvedilol (COREG) 25 MG tablet Take 1 tablet (25 mg total) by mouth 2 (two) times daily with a meal. 12/09/16  Yes [name], MD  cephALEXin (KEFLEX) 500 MG capsule Take 1 capsule (500 mg total) by mouth 2 (two) times daily. 06/01/17  Yes Pollina, Gwenyth Allegra, MD  cloNIDine (CATAPRES) 0.1 MG tablet Take 1 tablet (0.1 mg total) by mouth 2 (two) times daily. 01/06/17  Yes Weaver, Scott T, PA-C  famotidine (PEPCID) 20 MG tablet Take 1 tablet (20 mg total) by mouth 2 (two) times daily. 01/06/17  Yes Kathlen Mody,  Scott T, PA-C  furosemide (LASIX) 20 MG tablet Take 1.5 tablets (30 mg total) by mouth daily. 06/02/17  Yes Scot Jun, FNP  gabapentin (NEURONTIN) 300 MG capsule Take 1 capsule (300 mg total) by mouth 3 (three) times daily. 12/25/16  Yes [name], FNP  hydrALAZINE (APRESOLINE) 100 MG tablet Take 1 tablet (100 mg total) by mouth 3 (three) times daily. 12/09/16  Yes [name], MD  ISENTRESS 400 MG tablet TAKE 1 TABLET BY MOUTH TWICE DAILY Patient taking differently: TAKE 400 mg BY MOUTH TWICE DAILY 05/04/17  Yes Campbell Riches, MD  lamiVUDine (EPIVIR) 150 MG tablet TAKE 1 TABLET BY MOUTH DAILY 05/04/17  Yes Campbell Riches, MD  levocetirizine  (XYZAL) 5 MG tablet Take 1 tablet (5 mg total) by mouth every evening. 03/31/17  Yes [name], FNP  ondansetron (ZOFRAN ODT) 4 MG disintegrating tablet 65m ODT q4 hours prn nausea/vomit 06/01/17  Yes BMalvin Johns MD  potassium chloride SA (K-DUR,KLOR-CON) 20 MEQ tablet Take 30 mEq by mouth daily.   Yes [provider]  predniSONE (DELTASONE) 20 MG tablet Take 3 PO QAM x3days, 2 PO QAM x3days, 1 PO QAM x3days 06/02/17  Yes HScot Jun FNP  spironolactone (ALDACTONE) 25 MG tablet Take 25 mg by mouth daily.   Yes [provider]  traMADol (ULTRAM) 50 MG tablet Take 1 tablet (50 mg total) by mouth every 6 (six) hours as needed. Patient taking differently: Take 50 mg by mouth every 6 (six) hours as needed for moderate pain.  06/01/17  Yes Pollina, CGwenyth Allegra MD  triamcinolone cream (KENALOG) 0.1 % Apply 1 application topically 2 (two) times daily. 06/02/17  Yes HScot Jun FNP  zinc gluconate 50 MG tablet Take 100 mg by mouth daily.   Yes [provider]    Family History Family History  Problem Relation Age of Onset  . Heart failure Mother   . Heart attack Maternal Grandmother   . Hypertension Sister   . Hypertension Brother     Social History Social History  Substance Use Topics  . Smoking status: Former Smoker    Quit date: 2000  . Smokeless tobacco: Former [employer] developer   Quit date: 09/10/1999  . Alcohol use 8.4 oz/week    14 Shots of liquor per week     Comment: Light use     Allergies   Lisinopril   Review of Systems Review of Systems  ROS reviewed and all otherwise negative except for mentioned in HPI   Physical Exam Updated Vital Signs BP (!) 195/128   Pulse 92   Temp 100.1 F (37.8 C) (Oral)   Resp (!) 32   Ht 5' 10"  (1.778 m)   Wt 98.9 kg (218 lb)   SpO2 96%   BMI 31.28 kg/m  Vitals reviewed Physical Exam  Physical Examination: General appearance - alert, ill appearing, and in no distress Mental status - alert,  oriented to person, place, and time Eyes - no conjunctival injection no scleral icterus Mouth - mucous membranes moist, pharynx normal without lesions Chest - BSS, tachypneic, good air movement but expiratory wheezing throughout Heart - normal rate, regular rhythm, normal S1, S2, no murmurs, rubs, clicks or gallops Abdomen - soft, nontender, nondistended, no masses or organomegaly Neurological - alert, oriented, normal speech, no focal findings or movement disorder noted Extremities - peripheral pulses normal, no pedal edema, no clubbing or cyanosis Skin - normal coloration and turgor, no rashes  ED Treatments / Results  Labs (all labs ordered are listed, but only abnormal results are displayed) Labs Reviewed  COMPREHENSIVE METABOLIC PANEL - Abnormal; Notable for the following:       Result Value   BUN 33 (*)    Creatinine, Ser 3.43 (*)    ALT 16 (*)    GFR calc non Af Amer 19 (*)    GFR calc Af Amer 22 (*)    All other components within normal limits  CBC WITH DIFFERENTIAL/PLATELET - Abnormal; Notable for the following:    WBC 14.3 (*)    RBC 3.76 (*)    Hemoglobin 11.6 (*)    HCT 32.6 (*)    Neutro Abs 9.8 (*)    Monocytes Absolute 1.3 (*)    All other components within normal limits  I-STAT CG4 LACTIC ACID, ED - Abnormal; Notable for the following:    Lactic Acid, Venous 2.28 (*)    All other components within normal limits  CULTURE, BLOOD (ROUTINE X 2)  CULTURE, BLOOD (ROUTINE X 2)  PROTIME-INR  URINALYSIS, ROUTINE W REFLEX MICROSCOPIC  BRAIN NATRIURETIC PEPTIDE    EKG  EKG Interpretation None       Radiology Dg Chest Port 1 View  Result Date: 06/07/2017 CLINICAL DATA:  Short of breath, fever, cough EXAM: PORTABLE CHEST 1 VIEW COMPARISON:  05/31/2017 FINDINGS: Mild cardiomegaly. Central basilar airspace opacities are confluent. Vascular congestion. No pneumothorax or pleural effusion. IMPRESSION: Cardiomegaly and bilateral airspace disease. Differential  diagnosis includes bilateral pneumonia, ARDS, and pulmonary edema. Electronically Signed   By: Marybelle Killings M.D.   On: 06/07/2017 10:17    Procedures Procedures (including critical care time)  Medications Ordered in ED Medications  sodium chloride 0.9 % bolus 1,000 mL (1,000 mLs Intravenous New Bag/Given 06/07/17 1140)  albuterol (PROVENTIL) (2.5 MG/3ML) 0.083% nebulizer solution 5 mg (5 mg Nebulization Given 06/07/17 1007)  ipratropium (ATROVENT) nebulizer solution 0.5 mg (0.5 mg Nebulization Given 06/07/17 1007)     Initial Impression / Assessment and Plan / ED Course  I have reviewed the triage vital signs and the nursing notes.  Pertinent labs & imaging results that were available during my care of the patient were reviewed by me and considered in my medical decision making (see chart for details).  11:44 AM  D/w medicine teaching service for admission.  They will see patient in the ED    Pt with hx of HIV, CHF, CKD presenting with c/o fever, cough and shortness of breath.  Pt is currently on keflex for UTI.  CXR shows bialteral infiltrates, pt does have increase in WBC and low grade fever in the ED.  Sepsis protocol initiated, lactate 2.2- IV fluids given.  Started on broad spectrum abx.  D/w medicine residents for admission and they have seen patient in the ED.  CXR findings may also be due to fluid overload as well- as lasix was recently decreased by PMD.  Pt with improved shortness of breath after neb treatment in the ED.    Final Clinical Impressions(s) / ED Diagnoses   Final diagnoses:  HCAP (healthcare-associated pneumonia)  Leukocytosis, unspecified type  Hypertension, unspecified type    New Prescriptions New Prescriptions   No medications on file     Pixie Casino, MD 06/07/17 1547

## 2017-06-08 ENCOUNTER — Other Ambulatory Visit (HOSPITAL_COMMUNITY): Payer: Self-pay

## 2017-06-08 ENCOUNTER — Ambulatory Visit: Payer: Self-pay | Admitting: Infectious Diseases

## 2017-06-08 DIAGNOSIS — Z8619 Personal history of other infectious and parasitic diseases: Secondary | ICD-10-CM

## 2017-06-08 DIAGNOSIS — I5033 Acute on chronic diastolic (congestive) heart failure: Secondary | ICD-10-CM

## 2017-06-08 DIAGNOSIS — N184 Chronic kidney disease, stage 4 (severe): Secondary | ICD-10-CM | POA: Diagnosis not present

## 2017-06-08 DIAGNOSIS — Z21 Asymptomatic human immunodeficiency virus [HIV] infection status: Secondary | ICD-10-CM

## 2017-06-08 DIAGNOSIS — G4733 Obstructive sleep apnea (adult) (pediatric): Secondary | ICD-10-CM | POA: Diagnosis not present

## 2017-06-08 DIAGNOSIS — Z888 Allergy status to other drugs, medicaments and biological substances status: Secondary | ICD-10-CM | POA: Diagnosis not present

## 2017-06-08 DIAGNOSIS — J069 Acute upper respiratory infection, unspecified: Secondary | ICD-10-CM | POA: Diagnosis not present

## 2017-06-08 DIAGNOSIS — Z79899 Other long term (current) drug therapy: Secondary | ICD-10-CM | POA: Diagnosis not present

## 2017-06-08 DIAGNOSIS — I130 Hypertensive heart and chronic kidney disease with heart failure and stage 1 through stage 4 chronic kidney disease, or unspecified chronic kidney disease: Secondary | ICD-10-CM | POA: Diagnosis not present

## 2017-06-08 DIAGNOSIS — Z87440 Personal history of urinary (tract) infections: Secondary | ICD-10-CM | POA: Diagnosis not present

## 2017-06-08 LAB — LACTIC ACID, PLASMA: Lactic Acid, Venous: 1.7 mmol/L (ref 0.5–1.9)

## 2017-06-08 LAB — BASIC METABOLIC PANEL
ANION GAP: 12 (ref 5–15)
BUN: 34 mg/dL — AB (ref 6–20)
CALCIUM: 9.2 mg/dL (ref 8.9–10.3)
CO2: 24 mmol/L (ref 22–32)
Chloride: 103 mmol/L (ref 101–111)
Creatinine, Ser: 3.45 mg/dL — ABNORMAL HIGH (ref 0.61–1.24)
GFR calc Af Amer: 22 mL/min — ABNORMAL LOW (ref >60)
GFR, EST NON AFRICAN AMERICAN: 19 mL/min — AB (ref >60)
GLUCOSE: 108 mg/dL — AB (ref 65–99)
POTASSIUM: 3.6 mmol/L (ref 3.5–5.1)
SODIUM: 139 mmol/L (ref 135–145)

## 2017-06-08 LAB — TSH: TSH: 1.275 u[IU]/mL (ref 0.350–4.500)

## 2017-06-08 LAB — MAGNESIUM: MAGNESIUM: 1.6 mg/dL — AB (ref 1.7–2.4)

## 2017-06-08 MED ORDER — PHENOL 1.4 % MT LIQD
1.0000 | OROMUCOSAL | Status: DC | PRN
  Filled 2017-06-08: qty 177

## 2017-06-08 MED ORDER — FUROSEMIDE 20 MG PO TABS: 60.0000 mg | ORAL_TABLET | Freq: Every day | ORAL | 0 refills | Status: DC

## 2017-06-08 MED ORDER — MENTHOL 3 MG MT LOZG
1.0000 | LOZENGE | OROMUCOSAL | Status: DC | PRN
Start: 2017-06-08 — End: 2017-06-08
  Filled 2017-06-08: qty 9

## 2017-06-08 MED ORDER — MAGNESIUM SULFATE 2 GM/50ML IV SOLN
2.0000 g | Freq: Once | INTRAVENOUS | Status: AC
Start: 2017-06-08 — End: 2017-06-08
  Administered 2017-06-08: 2 g via INTRAVENOUS
  Filled 2017-06-08: qty 50

## 2017-06-08 MED ORDER — FUROSEMIDE 40 MG PO TABS: 60.0000 mg | ORAL_TABLET | Freq: Every morning | ORAL | Status: DC

## 2017-06-08 NOTE — Discharge Instructions (Signed)
Please follow up with your PCP regarding your hospitalization. We increased your daily lasix dose from 30 mg to 60 mg during your hospitalization because you had too much flood on your lungs. Please schedule an appointment for them to see you this week.

## 2017-06-08 NOTE — Care Management Note (Signed)
Case Management Note  Patient Details  Name: Albert Eaton MRN: [id number] Date of Birth: 12-01-1965  Subjective/Objective:   Pt in with SOB. He is from home with his spouse. No insurance listed.                  Action/Plan: Patient discharging home with self care. He is active with [facility] and uses [facility] pharmacy for some of his medications.  Pt has transportation home today.   Expected Discharge Date:  06/08/17               Expected Discharge Plan:  Home/Self Care  In-House Referral:     Discharge planning Services  CM Consult  Post Acute Care Choice:    Choice offered to:     DME Arranged:    DME Agency:     HH Arranged:    HH Agency:     Status of Service:  Completed, signed off  If discussed at [employer] of Stay Meetings, dates discussed:    Additional Comments:  Pollie Friar, RN 06/08/2017, 12:52 PM

## 2017-06-08 NOTE — Progress Notes (Signed)
Subjective:  Albert Eaton feels well this morning and does not have any new acute complaints. He continues to endorse productive cough, but this has improved and is no longer blood tinged. He states that his breathing has improved and no longer complains of increased SOB at rest. He still experiences some SOB while walking around, but this is much improved from yesterday. He does not endorse any dysuria, fevers, abdominal pain, or nausea/vomiting.   Objective:  Vital signs in last 24 hours: Vitals:   06/07/17 2044 06/08/17 0017 06/08/17 0539 06/08/17 0742  BP: (!) 149/109 138/85 (!) 143/103 (!) 151/98  Pulse: 91 87 83 73  Resp:  18 18   Temp: 99.2 F (37.3 C) 98.7 F (37.1 C) 99.3 F (37.4 C) 99.2 F (37.3 C)  TempSrc: Oral Oral Oral Oral  SpO2: 91% 93% 94% 96%  Weight:   206 lb 4.8 oz (93.6 kg)   Height:       Physical Exam  Constitutional: He appears well-developed and well-nourished. No distress.  Neck: No JVD present.  Cardiovascular: Normal rate, regular rhythm and intact distal pulses.   Murmur (holosystolic murmur heard best at left apex of heart) heard. Pulmonary/Chest: Effort normal. No respiratory distress.  Minimal crackles appreciated at lung bases, with interval improvement from yesterday's exam.  Abdominal: Soft. Bowel sounds are normal. He exhibits no distension. There is no tenderness. There is no guarding.  Musculoskeletal: He exhibits no edema (of bilateral lower extremities) or tenderness (of bilateral lower extremities).  Skin: Skin is warm and dry. No rash noted. He is not diaphoretic. No erythema.   Assessment/Plan:  Principal Problem:   SOB (shortness of breath) Active Problems:   HIV disease (HCC)   CKD stage 4 secondary to hypertension (HCC)   Chronic diastolic CHF (congestive heart failure) (HCC)   OSA (obstructive sleep apnea)  Albert Eaton is a 51 yo with a PMH of well controlled HIV, CHF, HTN, stage IV CKD, and OSA who presented with a 24  hour history of cough and week long history of progressive shortness of breath on exertion. He was admitted to the internal medicine teaching service for management.  CHF exacerbation: The patient's symptoms of fever, SOB, and productive cough were concerning for viral URI or pneumonia. A chest Xray on admission showed bilateral opacities, which has a broad differential ranging from bilateral pneumonia to ARDS and pulmonary edema. The ED was concerned for bilateral pneumonia given the patient's fever on presentation and this imaging finding. Upon further consideration of the patient's symptoms, which include orthopnea, increasing abdominal girth, and some lower extremity swelling, a diagnosis of CHF exacerbation is more likely. This is also consistent with a recent history of reduction in daily Lasix dose 2/2 acute kidney injury in the setting of a UTI on 8/9. The patient's symptoms and physical exam improved with IV diuresis overnight. -Start PO lasix 60 mg qDaily -Continue home carvedilol 25 mg BID and sprinolactone 25 mg daily  Viral URI: Patient reported home temperature to 102 in ED. He took Tylenol prior to admission and his temperature on arrival was 100.1. His symptoms of acute onset of productive cough with green sputum. He remained afebrile on admission and responded well to symptomatic management with chloraseptic spray and throat lozenges. Per chart review the patient started taking steroids on 8/9 for contact dermatitis 2/2 bilateral axillary rash. This, along with an acute viral illness, likely contributed to the patient's leukocytosis on admission.   Recent Hx of UTI:  The patient has been taking Keflex as an outpatient for a previous urine culture which grew >100,000 Klebsiella. He denies an urinary symptoms today, stating that he has felt better since taking the medicine. His urinalysis on admission was negative for nitrites or leukocyte esterase, suggesting resolution of this previous UTI.  He will be instructed to finish his previously prescribed antibiotic course as an outpatient.   HTN: The patient's BP was elevated in the ED, however the patient has not taken his medication prior to arrival. His BP improved with administration of his home BP regimen and IV lasix. -Home amlodipine 10 mg daily and clonidine 0.51m BID  CKD Stage IV: The patient's Cr on admission was 3.43 with historical baseline between 2.9-3.5. This is also decreased from his last ED visit where his Cr was elevated to 4.67 and his PCP had to change his Lasix dosing as a response to this AKI. It appears he his back to baseline.  HIV: Last viral load undetectable in 04/2017 with a CD4 count >600, therefore not at significant risk for opportunistic infection, but will continue to monitor.  -Continue home abacavir 300 mg, lamivudine 150 mg, and raltegravir 400 mg BID  FEN/GI: -Home KDur 30 mEq qDaily -Heart healthy diet  VTE prophylaxis: -Lovenox 30 mg daily  Dispo: Anticipated discharge in approximately 1 day.   NThomasene Ripple MD 06/08/2017, 4:01 PM Pager: [phone]

## 2017-06-08 NOTE — Discharge Summary (Signed)
Name: Albert Eaton MRN: [id number] DOB: 04-Mar-1966 51 y.o. PCP: [name], FNP  Date of Admission: 06/07/2017  9:24 AM Date of Discharge: 06/08/2017 Attending Physician: Dr. Oval Linsey  Discharge Diagnosis:  Principal Problem:   SOB (shortness of breath) Active Problems:   HIV disease ([place])   CKD stage 4 secondary to hypertension (HCC)   Chronic diastolic CHF (congestive heart failure) (HCC)   OSA (obstructive sleep apnea)  Discharge Medications: Allergies as of 06/08/2017      Reactions   Lisinopril Cough      Medication List    TAKE these medications   abacavir 300 MG tablet Commonly known as:  ZIAGEN TAKE 1 TABLET BY MOUTH DAILY   allopurinol 100 MG tablet Commonly known as:  ZYLOPRIM Take 1 tablet (100 mg total) by mouth 2 (two) times daily.   amLODipine 10 MG tablet Commonly known as:  NORVASC Take 1 tablet (10 mg total) by mouth daily.   atorvastatin 40 MG tablet Commonly known as:  LIPITOR Take 1 tablet (40 mg total) by mouth daily.   carvedilol 25 MG tablet Commonly known as:  COREG Take 1 tablet (25 mg total) by mouth 2 (two) times daily with a meal.   cephALEXin 500 MG capsule Commonly known as:  KEFLEX Take 1 capsule (500 mg total) by mouth 2 (two) times daily.   cloNIDine 0.1 MG tablet Commonly known as:  CATAPRES Take 1 tablet (0.1 mg total) by mouth 2 (two) times daily.   famotidine 20 MG tablet Commonly known as:  PEPCID Take 1 tablet (20 mg total) by mouth 2 (two) times daily.   furosemide 20 MG tablet Commonly known as:  LASIX Take 3 tablets (60 mg total) by mouth daily. What changed:  how much to take   gabapentin 300 MG capsule Commonly known as:  NEURONTIN Take 1 capsule (300 mg total) by mouth 3 (three) times daily.   hydrALAZINE 100 MG tablet Commonly known as:  APRESOLINE Take 1 tablet (100 mg total) by mouth 3 (three) times daily.   ISENTRESS 400 MG tablet Generic drug:  raltegravir TAKE 1 TABLET BY  MOUTH TWICE DAILY What changed:  See the new instructions.   lamiVUDine 150 MG tablet Commonly known as:  EPIVIR TAKE 1 TABLET BY MOUTH DAILY   levocetirizine 5 MG tablet Commonly known as:  XYZAL Take 1 tablet (5 mg total) by mouth every evening.   ondansetron 4 MG disintegrating tablet Commonly known as:  ZOFRAN ODT 68m ODT q4 hours prn nausea/vomit   potassium chloride SA 20 MEQ tablet Commonly known as:  K-DUR,KLOR-CON Take 30 mEq by mouth daily.   predniSONE 20 MG tablet Commonly known as:  DELTASONE Take 3 PO QAM x3days, 2 PO QAM x3days, 1 PO QAM x3days   spironolactone 25 MG tablet Commonly known as:  ALDACTONE Take 25 mg by mouth daily.   traMADol 50 MG tablet Commonly known as:  ULTRAM Take 1 tablet (50 mg total) by mouth every 6 (six) hours as needed. What changed:  reasons to take this   triamcinolone cream 0.1 % Commonly known as:  KENALOG Apply 1 application topically 2 (two) times daily.   zinc gluconate 50 MG tablet Take 100 mg by mouth daily.       Disposition and follow-up:   [facility]  At the hospital follow up visit please address:  1.  The patient was admitted  with concern for pneumonia in the ED, but was found to be volume overloaded on exam. He was diagnosed with a CHF exacerbation and a viral URI. He was prescribed 60 mg Lasix qD on discharge and instructed to follow up with PCP regarding volume status and resolution of cough 2/2 viral URI  2.  Labs / imaging needed at time of follow-up: BMP to assess kidney function on increased lasix dose; consider BNP to ensure improvement with current regimen, as this was significantly elevated on admission  3.  Pending labs/ test needing follow-up: Blood cultures pending on discharge  Follow-up Appointments: Follow-up Information    [name], FNP. Schedule an appointment as soon as possible for a visit on  06/11/2017.   Specialty:  Family Medicine Why:  @10am  Contact information: [address] [address] [phone]          Hospital Course by problem list: Principal Problem:   SOB (shortness of breath) Active Problems:   HIV disease ([place])   CKD stage 4 secondary to hypertension (HCC)   Chronic diastolic CHF (congestive heart failure) (HCC)   OSA (obstructive sleep apnea)   Albert Eaton is a 52 yo with a PMH of well controlled HIV, CHF, HTN, stage IV CKD, and OSA who presented with a 24 hour history of cough and week long history of progressive shortness of breath on exertion. He was admitted to the internal medicine teaching service for management. The problems addressed during the admission are as follows:  CHF exacerbation: The patient's symptoms of fever, SOB, and productive cough were concerning for viral URI or pneumonia. A chest Xray on admission showed bilateral opacities throughout his lungs. The ED was concerned for bilateral pneumonia given the patient's fever and gave vancomycin/cefepime. Upon further consideration of the patient's symptoms upon admission to the teaching service, which included orthopnea, increasing abdominal girth, and some lower extremity swelling, a diagnosis of CHF exacerbation was thought to be more likely. This diagnosis was also consistent with a recent history of reduction in daily Lasix dose 2/2 acute kidney injury in the setting of a UTI on 8/9. The patient's symptoms and physical exam improved with IV diuresis overnight. The patient was discharged on 60 mg Lasix daily, with instructions to follow up with his PCP regarding his volume status and resolution of symptoms.   Viral XTG:GYIRSWN reported home temperature to 102 in ED. He took Tylenol prior to admission and his temperature on arrival was 100.1. His symptoms of acute onset of productive cough with green sputum. He remained afebrile on admission and responded well to  symptomatic management with chloraseptic spray and throat lozenges.   Recent Hx of UTI: The patient has been taking Keflex as an outpatient for a previous urine culture which grew >100,000 Klebsiella. He denies an urinary symptoms today, stating that he has felt better since taking the medicine. His urinalysis on admission was negative for nitrites or leukocyte esterase, suggesting resolution of this previous UTI. He was instructed to finish his antibiotic course as an outpatient.   HTN: The patient's BP was elevated on admission, however the patient has not taken his medication prior to arrival. His BP improved with administration of his home BP regimen and IV diuresis.  CKD Stage IV: The patient's Cr on admission was 3.43 with historical baseline between 2.9-3.5. This is also decreased from his last ED visit where his Cr was elevated to 4.67 and his PCP had to change his Lasix  dosing as a response to this AKI. It appears he his back to baseline.  EHM:CNOB viral load undetectable in 04/2017 with a CD4 count >600, therefore not at significant risk for opportunistic infection. Since he remained afebrile throughout admission and there were no signs/symptoms of infection, he was continued on his home HIV regimen and told to follow up with PCP.   Discharge Vitals:   BP (!) 151/98 (BP Location: Right Arm)   Pulse 73   Temp 99.2 F (37.3 C) (Oral)   Resp 18   Ht 5' 10"  (1.778 m)   Wt 206 lb 4.8 oz (93.6 kg)   SpO2 96%   BMI 29.60 kg/m   Pertinent Labs, Studies, and Procedures:   BMP BMP Latest Ref Rng & Units 06/08/2017 06/07/2017 06/01/2017  Glucose 65 - 99 mg/dL 108(H) 94 98  BUN 6 - 20 mg/dL 34(H) 33(H) 36(H)  Creatinine 0.61 - 1.24 mg/dL 3.45(H) 3.43(H) 4.80(H)  BUN/Creat Ratio 9 - 20 - - -  Sodium 135 - 145 mmol/L 139 137 140  Potassium 3.5 - 5.1 mmol/L 3.6 3.6 3.2(L)  Chloride 101 - 111 mmol/L 103 102 102  CO2 22 - 32 mmol/L 24 22 -  Calcium 8.9 - 10.3 mg/dL 9.2 9.7 -   CBC CBC  Latest Ref Rng & Units 06/07/2017 06/01/2017 06/01/2017  WBC 4.0 - 10.5 K/uL 14.3(H) - 12.7(H)  Hemoglobin 13.0 - 17.0 g/dL 11.6(L) 11.6(L) 11.7(L)  Hematocrit 39.0 - 52.0 % 32.6(L) 34.0(L) 33.7(L)  Platelets 150 - 400 K/uL 332 - 238   BNP    Component Value Date/Time   BNP 1,059.2 (H) 06/07/2017 1110   Urinalysis    Component Value Date/Time   COLORURINE YELLOW 06/07/2017 1140   APPEARANCEUR CLEAR 06/07/2017 1140   LABSPEC 1.012 06/07/2017 1140   PHURINE 5.0 06/07/2017 1140   GLUCOSEU NEGATIVE 06/07/2017 1140   HGBUR NEGATIVE 06/07/2017 1140   BILIRUBINUR NEGATIVE 06/07/2017 1140   KETONESUR NEGATIVE 06/07/2017 1140   PROTEINUR >=300 (A) 06/07/2017 1140   UROBILINOGEN 0.2 06/02/2017 0941   NITRITE NEGATIVE 06/07/2017 1140   LEUKOCYTESUR NEGATIVE 06/07/2017 1140   Chest X ray, portable (on admission @ 10:00am): FINDINGS: Mild cardiomegaly. Central basilar airspace opacities are confluent. Vascular congestion. No pneumothorax or pleural effusion.  IMPRESSION: Cardiomegaly and bilateral airspace disease. Differential diagnosis includes bilateral pneumonia, ARDS, and pulmonary edema.  Chest X ray, 2 view (after one dose of IV Lasix, 40 mg @ 8:00pm): FINDINGS: The lungs are well-aerated. Vascular congestion is noted. Increased interstitial markings may reflect pneumonia or interstitial edema. This is mildly improved from the recent prior study. There is no evidence of pleural effusion or pneumothorax.  The heart is borderline normal in size. No acute osseous abnormalities are seen.  IMPRESSION: Vascular congestion noted. Increased interstitial markings may reflect pneumonia or interstitial edema, mildly improved from the recent prior study.   Discharge Instructions: Discharge Instructions    Call MD for:  difficulty breathing, headache or visual disturbances    Complete by:  As directed    Call MD for:  persistant dizziness or light-headedness    Complete by:  As  directed    Call MD for:  temperature >100.4    Complete by:  As directed    Diet - low sodium heart healthy    Complete by:  As directed    Increase activity slowly    Complete by:  As directed      Signed: Thomasene Ripple, MD 06/08/2017, 4:20 PM  Pager: [phone]

## 2017-06-08 NOTE — Progress Notes (Signed)
Subjective: [name] reports feeling well this morning. He has not experienced any shortness of breath today. His cough has improved, and he has not coughed up any sputum, blood-tinged or otherwise. He has been able to walk around his room without difficulty. He has not had any urinary frequency or burning with urination. He denies any current chest pain and feels ready to go home.  Objective: Vital signs in last 24 hours: Vitals:   06/07/17 2044 06/08/17 0017 06/08/17 0539 06/08/17 0742  BP: (!) 149/109 138/85 (!) 143/103 (!) 151/98  Pulse: 91 87 83 73  Resp:  18 18   Temp: 99.2 F (37.3 C) 98.7 F (37.1 C) 99.3 F (37.4 C) 99.2 F (37.3 C)  TempSrc: Oral Oral Oral Oral  SpO2: 91% 93% 94% 96%  Weight:   93.6 kg (206 lb 4.8 oz)   Height:       Weight change:   Intake/Output Summary (Last 24 hours) at 06/08/17 1153 Last data filed at 06/08/17 1033  Gross per 24 hour  Intake             1440 ml  Output              351 ml  Net             1089 ml   General appearance: alert, cooperative and no distress Lungs: clear to auscultation bilaterally Heart: systolic murmur: holosystolic 3/6, blowing at apex Abdomen: soft, non-tender; bowel sounds normal; no masses,  no organomegaly Extremities: extremities normal, atraumatic, no cyanosis or edema  Lab Results: UA: clear apart from > 300 protein Mg2+: 1.6 BMP: Na+ 139, K+ 3.6, Cl- 103, CO2 24, glucose 108, BUN 34, Cr 3.45, Ca2+ 9.2, anion gap 12 Lactic acid: 1.7 TSH: 1.275  Micro Results: Recent Results (from the past 240 hour(s))  Culture, blood (Routine x 2)     Status: None   Collection Time: 05/31/17  9:05 PM  Result Value Ref Range Status   Specimen Description BLOOD RIGHT ARM  Final   Special Requests IN PEDIATRIC BOTTLE Blood Culture adequate volume  Final   Culture NO GROWTH 5 DAYS  Final   Report Status 06/05/2017 FINAL  Final  Urine Culture     Status: Abnormal   Collection Time: 05/31/17  9:15 PM  Result Value  Ref Range Status   Specimen Description URINE, RANDOM  Final   Special Requests ADDED 0339 06/01/17  Final   Culture >=100,000 COLONIES/mL KLEBSIELLA PNEUMONIAE (A)  Final   Report Status 06/03/2017 FINAL  Final   Organism ID, Bacteria KLEBSIELLA PNEUMONIAE (A)  Final      Susceptibility   Klebsiella pneumoniae - MIC*    AMPICILLIN >=32 RESISTANT Resistant     CEFAZOLIN <=4 SENSITIVE Sensitive     CEFTRIAXONE <=1 SENSITIVE Sensitive     CIPROFLOXACIN <=0.25 SENSITIVE Sensitive     GENTAMICIN <=1 SENSITIVE Sensitive     IMIPENEM 0.5 SENSITIVE Sensitive     NITROFURANTOIN 64 INTERMEDIATE Intermediate     TRIMETH/SULFA <=20 SENSITIVE Sensitive     AMPICILLIN/SULBACTAM 8 SENSITIVE Sensitive     PIP/TAZO <=4 SENSITIVE Sensitive     Extended ESBL NEGATIVE Sensitive     * >=100,000 COLONIES/mL KLEBSIELLA PNEUMONIAE  Culture, blood (Routine x 2)     Status: None   Collection Time: 05/31/17  9:24 PM  Result Value Ref Range Status   Specimen Description BLOOD LEFT ARM  Final   Special Requests   Final  BOTTLES DRAWN AEROBIC AND ANAEROBIC Blood Culture adequate volume   Culture NO GROWTH 5 DAYS  Final   Report Status 06/05/2017 FINAL  Final   Studies/Results: X-ray Chest Pa And Lateral  Result Date: 06/07/2017 CLINICAL DATA:  Acute onset of shortness of breath and chills. Initial encounter. EXAM: CHEST  2 VIEW COMPARISON:  Chest radiograph performed earlier today at 10:08 a.m. FINDINGS: The lungs are well-aerated. Vascular congestion is noted. Increased interstitial markings may reflect pneumonia or interstitial edema. This is mildly improved from the recent prior study. There is no evidence of pleural effusion or pneumothorax. The heart is borderline normal in size. No acute osseous abnormalities are seen. IMPRESSION: Vascular congestion noted. Increased interstitial markings may reflect pneumonia or interstitial edema, mildly improved from the recent prior study. Electronically Signed   By:  Garald Balding M.D.   On: 06/07/2017 22:54   Dg Chest Port 1 View  Result Date: 06/07/2017 CLINICAL DATA:  Short of breath, fever, cough EXAM: PORTABLE CHEST 1 VIEW COMPARISON:  05/31/2017 FINDINGS: Mild cardiomegaly. Central basilar airspace opacities are confluent. Vascular congestion. No pneumothorax or pleural effusion. IMPRESSION: Cardiomegaly and bilateral airspace disease. Differential diagnosis includes bilateral pneumonia, ARDS, and pulmonary edema. Electronically Signed   By: Marybelle Killings M.D.   On: 06/07/2017 10:17   Medications: I have reviewed the patient's current medications. Scheduled Meds: . abacavir  300 mg Oral Daily  . allopurinol  100 mg Oral BID  . amLODipine  10 mg Oral Daily  . atorvastatin  40 mg Oral q1800  . carvedilol  25 mg Oral BID WC  . cloNIDine  0.1 mg Oral BID  . enoxaparin (LOVENOX) injection  30 mg Subcutaneous Q24H  . famotidine  20 mg Oral QHS  . furosemide  60 mg Oral q morning - 10a  . gabapentin  300 mg Oral TID  . hydrALAZINE  100 mg Oral TID  . lamiVUDine  150 mg Oral Daily  . potassium chloride SA  30 mEq Oral Daily  . raltegravir  400 mg Oral BID  . sodium chloride flush  3 mL Intravenous Q12H  . sodium chloride flush  3 mL Intravenous Q12H  . spironolactone  25 mg Oral Daily  . triamcinolone cream  1 application Topical BID   Continuous Infusions: . sodium chloride     PRN Meds:.sodium chloride, menthol-cetylpyridinium, ondansetron, phenol, sodium chloride flush, traMADol Assessment/Plan: Principal Problem:   SOB (shortness of breath) Active Problems:   HIV disease (HCC)   CKD stage 4 secondary to hypertension (HCC)   Chronic diastolic CHF (congestive heart failure) (HCC)   OSA (obstructive sleep apnea)  Albert Eaton is a 51 year old man with a past medical history of CHF, HTN, stage IV CKD, HIV and OSA who presents after an episode of shortness of breath causing an awakening from sleep in the setting of volume overload.  Shortness  of breath in setting of volume overload: Mr. Abreu has responded well to the diuretics given to him in the past day (IV lasix 40 mg x2). He has had a weight loss of 5 lbs compared to yesterday afternoon. He seems less volume overloaded on exam this morning (less edema, less crackles on pulmonary auscultation). He is symptomatically improved compared to yesterday, and has had less shortness of breath and dyspnea on exertion. Because he had been previously on 60 mg BID but had been decreased to 30 mg qd, we will increase his dose to 60 mg qd to balance the potential harm  the medication could do on his kidneys with adequate diuresis. - discharge on 60 mg lasix PO qd  Isolated fever: A source for Mr. Wong fever has not been found, and he has remained afebrile since admission (Tmax 37.8). He reported a fever of 102 when he checked his temperature yesterday morning. His UA came back clear except for proteinuria, so his UTI from last week has cleared up. His blood cultures have had no grown at 1 day. - continue home keflex for last week's UTI - f/u on blood cultures  HTN: His blood pressure has been under better control overnight and this morning. Range from 138-151/85-109 - continue home hydralazine 100 mg TID, clonidine 0.1 mg BID, amlodipine 10 mg daily  Stage IV CKD: BUN 34 and Cr 3.45 today, so no significant change from yesterday. He will be discharge on 60 mg lasix qd PO to balance the potential harm the medication could do on his kidneys with adequate diuresis. - continue to monitor  OSA: Not on CPAP at home because of claustrophobia. - consider nasal attachment CPAP  HIV disease: Well controlled. Most recent CD4 count 690 and viral load undetectable. - continue current regimen of lamivudine 150 mg qd, abacavir 300 mg qd and raltegravir 400 mg BID  This is a Careers information officer Note.  The care of the patient was discussed with Dr. Berneice Gandy and the assessment and plan formulated with their  assistance.  Please see their attached note for official documentation of the daily encounter.   LOS: 0 days   Wynona Meals, Medical Student 06/08/2017, 11:53 AM

## 2017-06-08 NOTE — Progress Notes (Signed)
Pt has orders to be discharged. Discharge instructions given and pt has no additional questions at this time. Medication regimen reviewed and pt educated. Pt verbalized understanding and has no additional questions. Telemetry box removed. IV removed and site in good condition. Pt stable and waiting for transportation. 

## 2017-06-11 ENCOUNTER — Ambulatory Visit (INDEPENDENT_AMBULATORY_CARE_PROVIDER_SITE_OTHER): Payer: Medicaid Other | Admitting: Family Medicine

## 2017-06-11 ENCOUNTER — Encounter: Payer: Self-pay | Admitting: Family Medicine

## 2017-06-11 VITALS — BP 158/102 | HR 66 | Temp 97.8°F | Resp 16 | Ht 70.0 in | Wt 210.0 lb

## 2017-06-11 DIAGNOSIS — B20 Human immunodeficiency virus [HIV] disease: Secondary | ICD-10-CM | POA: Diagnosis not present

## 2017-06-11 DIAGNOSIS — N184 Chronic kidney disease, stage 4 (severe): Secondary | ICD-10-CM

## 2017-06-11 DIAGNOSIS — I129 Hypertensive chronic kidney disease with stage 1 through stage 4 chronic kidney disease, or unspecified chronic kidney disease: Secondary | ICD-10-CM | POA: Diagnosis not present

## 2017-06-11 DIAGNOSIS — I5032 Chronic diastolic (congestive) heart failure: Secondary | ICD-10-CM | POA: Diagnosis not present

## 2017-06-11 NOTE — Patient Instructions (Signed)
Chronic Kidney Disease, Adult Chronic kidney disease (CKD) happens when the kidneys are damaged during a time of 3 or more months. The kidneys are two organs that do many important jobs in the body. These jobs include:  Removing wastes and extra fluids from the blood.  Making hormones that maintain the amount of fluid in your tissues and blood vessels.  Making sure that the body has the right amount of fluids and chemicals.  Most of the time, this condition does not go away, but it can usually be controlled. Steps must be taken to slow down the kidney damage or stop it from getting worse. Otherwise, the kidneys may stop working. Follow these instructions at home:  Follow your diet as told by your doctor. You may need to avoid alcohol, salty foods (sodium), and foods that are high in potassium, calcium, and protein.  Take over-the-counter and prescription medicines only as told by your doctor. Do not take any new medicines unless your doctor says you can do that. These include vitamins and minerals. ? Medicines and nutritional supplements can make kidney damage worse. ? Your doctor may need to change how much medicine you take.  Do not use any tobacco products. These include cigarettes, chewing tobacco, and e-cigarettes. If you need help quitting, ask your doctor.  Keep all follow-up visits as told by your doctor. This is important.  Check your blood pressure. Tell your doctor if there are changes to your blood pressure.  Get to a healthy weight. Stay at that weight. If you need help with this, ask your doctor.  Start or continue an exercise plan. Try to exercise at least 30 minutes a day, 5 days a week.  Stay up-to-date with your shots (immunizations) as told by your doctor. Contact a doctor if:  Your symptoms get worse.  You have new symptoms. Get help right away if:  You have symptoms of end-stage kidney disease. These include: ? Headaches. ? Skin that is darker or lighter  than normal. ? Numbness in your hands or feet. ? Easy bruising. ? Having hiccups often. ? Chest pain. ? Shortness of breath. ? Stopping of menstrual periods in women.  You have a fever.  You are making very little pee (urine).  You have pain or bleeding when you pee (urinate). This information is not intended to replace advice given to you by your health care provider. Make sure you discuss any questions you have with your health care provider. Document Released: 01/07/2010 Document Revised: 03/20/2016 Document Reviewed: 06/11/2012 Elsevier Interactive Patient Education  2017 [employer].

## 2017-06-11 NOTE — Progress Notes (Signed)
Albert Eaton is a 51 yo with a PMH of well controlled HIV, CHF, HTN, stage IV CKD, and OSA who presents for a post hospital follow up. Albert Eaton was admitted to inpatient services following a  24 hour history of cough and week long history of progressive shortness of breath on exertion. He was admitted to the internal medicine teaching service for management. The problems addressed during the admission are as follows: Patient has a history of acute on chronic renal failure. Patient was seen by internal medicine and Furosemide was decreased from 60 to 30. Also, provider started a steroid taper for dermatitis. As tat point, Albert Eaton developed increased weight gain with shortness of breath on exertion. Patient was diuresed during admission. Symptoms were a result of volume overload. Albert Eaton was also treated empirically with antibiotic therapy for an upper respiratory infection.  Patient established care with nephrology this am. He has been taking all medications consistently and states that he feels better since hospital discharge.   He denies shortness of breath, chest pain, edema, abdominal pain, nausea, vomiting, or diarrhea.  Past Medical History:  Diagnosis Date  . Chronic diastolic CHF (congestive heart failure) ([place]) 01/06/2017   Echo 7/16 [facility] in [place], [name]) Mild AI, mild LAE, mild concentric LVH, EF 55, normal wall motion, mild to moderate MR, mild PI, RVSP 55 // Echo 10/09/16 ([facility]):  Moderate LVH, grade 2 diastolic dysfunction, mild MR, moderate LAE   . CKD (chronic kidney disease)   . Gout   . History of nuclear stress test    a. Nuc study 7/16: no scar or ischemia, EF 42 // b. Nuc study 12/17: EF 48, ?small apical ischemia, Low Risk  . HIV (human immunodeficiency virus infection) ([place])   . HLD (hyperlipidemia)   . Hypertensive heart disease with CHF (congestive heart failure) ([place]) 10/09/2016  . OSA (obstructive sleep apnea)    Social History   Social History  .  Marital status: Unknown    Spouse name: N/A  . Number of children: N/A  . Years of education: N/A   Occupational History  . Not on file.   Social History Main Topics  . Smoking status: Former Smoker    Quit date: 2000  . Smokeless tobacco: Former Systems developer    Quit date: 09/10/1999  . Alcohol use 8.4 oz/week    14 Shots of liquor per week     Comment: Light use  . Drug use: No  . Sexual activity: No     Comment: declined condoms   Other Topics Concern  . Not on file   Social History Narrative  . No narrative on file   Immunization History  Administered Date(s) Administered  . Influenza,inj,Quad PF,36+ Mos 10/10/2016  . Meningococcal Mcv4o 10/15/2016  . Pneumococcal Polysaccharide-23 10/15/2016  . Tdap 01/27/2017   Review of Systems  Constitutional: Negative.   HENT: Negative.   Eyes: Negative.   Respiratory: Positive for cough. Negative for hemoptysis, sputum production and shortness of breath.   Gastrointestinal: Negative.   Genitourinary: Negative.   Musculoskeletal: Negative.   Skin: Negative.   Neurological: Negative.  Negative for weakness.  Endo/Heme/Allergies: Negative.   Psychiatric/Behavioral: Negative.    Physical Exam  Constitutional: He is oriented to person, place, and time. He appears well-developed and well-nourished.  HENT:  Head: Normocephalic and atraumatic.  Right Ear: External ear normal.  Left Ear: External ear normal.  Nose: Nose normal.  Mouth/Throat: Oropharynx is clear and moist.  Neck: Normal  range of motion. Neck supple.  Cardiovascular: Normal rate, regular rhythm, normal heart sounds and intact distal pulses.   Abdominal: Soft. Bowel sounds are normal.  Musculoskeletal: Normal range of motion.  Neurological: He is alert and oriented to person, place, and time.  Skin: Skin is warm and dry.  Psychiatric: He has a normal mood and affect. His behavior is normal. Judgment and thought content normal.  Plan   BP (!) 158/102 (BP Location:  Left Arm, Patient Position: Sitting, Cuff Size: Normal) Comment: manually  Pulse 66   Temp 97.8 F (36.6 C) (Oral)   Resp 16   Ht 5' 10"  (1.778 m)   Wt 210 lb (95.3 kg)   SpO2 99%   BMI 30.13 kg/m   1. Chronic diastolic CHF (congestive heart failure) (H[facility]) Medication has improved on current medication regimen. No changes warranted on today. Recommend that patient scheduled follow up with cardiology.  Discussed the importance following medication regimen consistently.   2. CKD stage 4 secondary to hypertension [facility]) Patient established care with nephrology this am.  Most recent GFR was 22, which is consistent with end stage renal disease Labs were obtained by nephrology, will not repeat labs.  Patient warrants a serum magnesium 3. HIV disease [facility]) Patient is taking prescribed anti-viral medication consistently. Follow up with Dr. Johnnye Sima as scheduled   RTC: 3 months or as needed.     The patient was given clear instructions to go to ER or return to medical center if symptoms do not improve, worsen or new problems develop. The patient verbalized understanding.    [name]  MSN, FNP-C [facility] [address] [place], [place] [address] [phone]

## 2017-06-12 LAB — CULTURE, BLOOD (ROUTINE X 2)
CULTURE: NO GROWTH
CULTURE: NO GROWTH
SPECIAL REQUESTS: ADEQUATE
SPECIAL REQUESTS: ADEQUATE

## 2017-06-15 ENCOUNTER — Other Ambulatory Visit: Payer: Self-pay | Admitting: Nephrology

## 2017-06-15 DIAGNOSIS — N184 Chronic kidney disease, stage 4 (severe): Secondary | ICD-10-CM

## 2017-06-22 ENCOUNTER — Ambulatory Visit
Admission: RE | Admit: 2017-06-22 | Discharge: 2017-06-22 | Disposition: A | Discharge status: Home / Self Care | Payer: No Typology Code available for payment source | Source: Ambulatory Visit | Attending: Nephrology | Admitting: Nephrology

## 2017-06-22 DIAGNOSIS — N184 Chronic kidney disease, stage 4 (severe): Secondary | ICD-10-CM

## 2017-06-25 ENCOUNTER — Encounter: Payer: Self-pay | Admitting: Family Medicine

## 2017-06-30 ENCOUNTER — Other Ambulatory Visit (HOSPITAL_COMMUNITY): Payer: Self-pay | Admitting: Nephrology

## 2017-06-30 DIAGNOSIS — N184 Chronic kidney disease, stage 4 (severe): Principal | ICD-10-CM

## 2017-06-30 DIAGNOSIS — I129 Hypertensive chronic kidney disease with stage 1 through stage 4 chronic kidney disease, or unspecified chronic kidney disease: Secondary | ICD-10-CM

## 2017-06-30 MED FILL — ?SPIRONOLACTONE 25 MG TABLE: 25 | 90 days supply | Qty: 45 | Fill #1 | Status: TO

## 2017-06-30 MED FILL — ALLOPURINOL 100 MG TABLET: 100 | 30 days supply | Qty: 60 | Fill #1

## 2017-06-30 MED FILL — ?CARVEDILOL 25 MG TABLET: 25 | 30 days supply | Qty: 60 | Fill #1 | Status: TO

## 2017-06-30 MED FILL — hydrALAZINE HCL 100 MG TABS: 100 | 30 days supply | Qty: 90 | Fill #1 | Status: TO

## 2017-06-30 MED FILL — POTASSIUM CL ER 20 MEQ TAB: 20 | 30 days supply | Qty: 60 | Fill #1 | Status: TO

## 2017-07-01 ENCOUNTER — Encounter: Payer: Self-pay | Admitting: Family Medicine

## 2017-07-01 ENCOUNTER — Ambulatory Visit (INDEPENDENT_AMBULATORY_CARE_PROVIDER_SITE_OTHER): Payer: Medicaid Other | Admitting: Family Medicine

## 2017-07-01 VITALS — BP 164/108 | HR 87 | Temp 98.8°F | Resp 14 | Ht 70.0 in | Wt 214.0 lb

## 2017-07-01 DIAGNOSIS — B20 Human immunodeficiency virus [HIV] disease: Secondary | ICD-10-CM | POA: Diagnosis not present

## 2017-07-01 DIAGNOSIS — R509 Fever, unspecified: Secondary | ICD-10-CM | POA: Diagnosis not present

## 2017-07-01 DIAGNOSIS — G629 Polyneuropathy, unspecified: Secondary | ICD-10-CM | POA: Diagnosis not present

## 2017-07-01 NOTE — Patient Instructions (Signed)
Neuropathy- Gabapentin 600 mg at bedtime to prevent daytime somnolence  Low grade temp-Scheduled tylenol 500 mg every 6 hours for the next 3 days  Avoid crowds ([employer], [employer], [employer], etc) Avoid raw foods

## 2017-07-01 NOTE — Progress Notes (Signed)
Subjective:    Patient ID: Albert Eaton, male    DOB: [birth year], 51 y.o.   MRN: [id number]  HPI Albert Eaton, a 51 year old male with a history of HIV, CKD stage 4, and accelerated hypertension presents complaining of neuropathy. He says that neuropathy has been worsening over the last week. He says that symptoms are primarily occurring to upper extremities. He describes current symptoms as numbness and tingling. Symptoms are currently of mild severity. Symptoms occur intermittently and last minutes. Current treatment includes Gabapentin 300 mg three times per day. [name] has not been taking medication consistently. He says that it makes him drowsy.   [name] says that he has been having intermittent fevers over the past week. He says that fevers have been occurring spordically.  Symptoms associated with the fever include body aches and chills, and patient denies abdominal pain, fatigue, headache, nausea, otitis symptoms, poor appetite, URI symptoms, urinary tract symptoms and vomiting.  He has tried to alleviate the symptoms with acetaminophen with satisfactory relief.     Past Medical History:  Diagnosis Date  . Chronic diastolic CHF (congestive heart failure) ([place]) 01/06/2017   Echo 7/16 [facility] in [place], [name]) Mild AI, mild LAE, mild concentric LVH, EF 55, normal wall motion, mild to moderate MR, mild PI, RVSP 55 // Echo 10/09/16 ([facility]):  Moderate LVH, grade 2 diastolic dysfunction, mild MR, moderate LAE   . CKD (chronic kidney disease)   . Gout   . History of nuclear stress test    a. Nuc study 7/16: no scar or ischemia, EF 42 // b. Nuc study 12/17: EF 48, ?small apical ischemia, Low Risk  . HIV (human immunodeficiency virus infection) ([place])   . HLD (hyperlipidemia)   . Hypertensive heart disease with CHF (congestive heart failure) ([place]) 10/09/2016  . OSA (obstructive sleep apnea)    Social History   Social History  . Marital status: Unknown    Spouse name: N/A   . Number of children: N/A  . Years of education: N/A   Occupational History  . Not on file.   Social History Main Topics  . Smoking status: Former Smoker    Quit date: 2000  . Smokeless tobacco: Former Systems developer    Quit date: 09/10/1999  . Alcohol use 8.4 oz/week    14 Shots of liquor per week     Comment: Light use  . Drug use: No  . Sexual activity: No     Comment: declined condoms   Other Topics Concern  . Not on file   Social History Narrative  . No narrative on file   Review of Systems  Constitutional: Positive for fever. Negative for fatigue and unexpected weight change.  HENT: Negative.   Respiratory: Negative.   Cardiovascular: Negative.   Gastrointestinal: Negative.   Endocrine: Negative for polydipsia, polyphagia and polyuria.  Genitourinary: Negative.   Musculoskeletal: Negative.   Allergic/Immunologic: Positive for immunocompromised state (HIV positive).  Neurological: Negative.   Hematological: Negative.   Psychiatric/Behavioral: Negative.        Objective:   Physical Exam  Constitutional: He is oriented to person, place, and time. He appears well-developed and well-nourished.  HENT:  Head: Normocephalic and atraumatic.  Right Ear: External ear normal.  Left Ear: External ear normal.  Nose: Nose normal.  Mouth/Throat: Oropharynx is clear and moist.  Eyes: Pupils are equal, round, and reactive to light. Conjunctivae and EOM are normal.  Neck: Normal range of motion. Neck supple.  Cardiovascular:  Normal rate, regular rhythm, normal heart sounds and intact distal pulses.   Pulmonary/Chest: Effort normal and breath sounds normal.  Abdominal: Soft. Bowel sounds are normal.  Lymphadenopathy:       Head (right side): No submental, no submandibular and no tonsillar adenopathy present.       Head (left side): No submental, no submandibular and no tonsillar adenopathy present.       Right cervical: No superficial cervical and no deep cervical adenopathy  present.      Left cervical: No superficial cervical and no deep cervical adenopathy present.    He has no axillary adenopathy.  Neurological: He is alert and oriented to person, place, and time. He has normal reflexes.  Skin: Skin is warm.  Psychiatric: He has a normal mood and affect. His behavior is normal. Judgment and thought content normal.     BP (!) 164/108 Comment: manually  Pulse 87   Temp 98.8 F (37.1 C) (Oral)   Resp 14   Ht 5' 10"  (1.778 m)   Wt 214 lb (97.1 kg)   SpO2 98%   BMI 30.71 kg/m  Assessment & Plan:  1. Neuropathy Will continue Gabapentin. He can take 600 mg HS to prevent daytime somnolence.  No pain, swelling or difficulty or restriction in movement of upper and lower extremities.   2. Low grade fever Continue Tylenol 500 mg every 6 hours as needed for fever.   3. HIV disease ([place]) Continue all antiviral medications as previously prescribed.   Lab Results  Component Value Date   HIV1RNAQUANT <20 DETECTED (A) 05/25/2017    Albert Pounds  MSN, FNP-C [facility] [address] [place], [place] [address] [phone]

## 2017-07-03 ENCOUNTER — Other Ambulatory Visit: Payer: Self-pay

## 2017-07-03 ENCOUNTER — Telehealth: Payer: Self-pay | Admitting: Family Medicine

## 2017-07-03 MED FILL — ?FUROSEMIDE 20 MG TABLET: 20 | 30 days supply | Qty: 180 | Fill #1 | Status: TO

## 2017-07-03 NOTE — Telephone Encounter (Signed)
Patient called because he was told to r/s his lab appt on Friday 07/03/17, but wasn't sure when it needed to be r/s'd to. Please advise.

## 2017-07-06 NOTE — Telephone Encounter (Signed)
Please have Albert Eaton schedule a lab appointment in 1 month.   Albert Pounds  MSN, FNP-C [facility] [address] [place], [place] [address] [phone]

## 2017-07-06 NOTE — Telephone Encounter (Signed)
[place],  Do we need patient to schedule a lab appointment? His next appointment is scheduled for 08/2017. Please advise. Thanks!

## 2017-07-07 NOTE — Telephone Encounter (Signed)
Called and left a message asking that patient call back and schedule an appointment for 1 month for labs only, and left call back number. Thanks!

## 2017-07-12 ENCOUNTER — Encounter: Payer: Self-pay | Admitting: Family Medicine

## 2017-07-13 ENCOUNTER — Other Ambulatory Visit: Payer: Self-pay

## 2017-07-13 ENCOUNTER — Other Ambulatory Visit: Payer: Self-pay | Admitting: Family Medicine

## 2017-07-13 MED ORDER — TRIAMCINOLONE ACETONIDE 0.1 % EX CREA: 1.0000 "application " | TOPICAL_CREAM | Freq: Two times a day (BID) | CUTANEOUS | 0 refills | Status: DC

## 2017-07-13 NOTE — Telephone Encounter (Signed)
Sent in cream to correct pharmacy. Thanks!

## 2017-07-14 ENCOUNTER — Other Ambulatory Visit: Payer: Self-pay | Admitting: Radiology

## 2017-07-15 ENCOUNTER — Ambulatory Visit (HOSPITAL_COMMUNITY)
Admission: RE | Admit: 2017-07-15 | Discharge: 2017-07-15 | Disposition: A | Discharge status: Home / Self Care | Payer: Medicaid Other | Source: Ambulatory Visit | Attending: Nephrology | Admitting: Nephrology

## 2017-07-15 ENCOUNTER — Other Ambulatory Visit (HOSPITAL_COMMUNITY): Payer: Self-pay | Admitting: Interventional Radiology

## 2017-07-15 ENCOUNTER — Encounter (HOSPITAL_COMMUNITY): Payer: Self-pay

## 2017-07-15 DIAGNOSIS — G4733 Obstructive sleep apnea (adult) (pediatric): Secondary | ICD-10-CM | POA: Diagnosis not present

## 2017-07-15 DIAGNOSIS — Z87891 Personal history of nicotine dependence: Secondary | ICD-10-CM | POA: Insufficient documentation

## 2017-07-15 DIAGNOSIS — Z9049 Acquired absence of other specified parts of digestive tract: Secondary | ICD-10-CM | POA: Insufficient documentation

## 2017-07-15 DIAGNOSIS — M109 Gout, unspecified: Secondary | ICD-10-CM | POA: Insufficient documentation

## 2017-07-15 DIAGNOSIS — Z79899 Other long term (current) drug therapy: Secondary | ICD-10-CM | POA: Insufficient documentation

## 2017-07-15 DIAGNOSIS — Z888 Allergy status to other drugs, medicaments and biological substances status: Secondary | ICD-10-CM | POA: Insufficient documentation

## 2017-07-15 DIAGNOSIS — I5032 Chronic diastolic (congestive) heart failure: Secondary | ICD-10-CM | POA: Diagnosis not present

## 2017-07-15 DIAGNOSIS — I130 Hypertensive heart and chronic kidney disease with heart failure and stage 1 through stage 4 chronic kidney disease, or unspecified chronic kidney disease: Secondary | ICD-10-CM | POA: Diagnosis not present

## 2017-07-15 DIAGNOSIS — I129 Hypertensive chronic kidney disease with stage 1 through stage 4 chronic kidney disease, or unspecified chronic kidney disease: Secondary | ICD-10-CM | POA: Diagnosis present

## 2017-07-15 DIAGNOSIS — E785 Hyperlipidemia, unspecified: Secondary | ICD-10-CM | POA: Insufficient documentation

## 2017-07-15 DIAGNOSIS — N184 Chronic kidney disease, stage 4 (severe): Secondary | ICD-10-CM | POA: Diagnosis not present

## 2017-07-15 DIAGNOSIS — Z21 Asymptomatic human immunodeficiency virus [HIV] infection status: Secondary | ICD-10-CM | POA: Insufficient documentation

## 2017-07-15 LAB — CBC
HEMATOCRIT: 29.9 % — AB (ref 39.0–52.0)
HEMOGLOBIN: 10.4 g/dL — AB (ref 13.0–17.0)
MCH: 30.6 pg (ref 26.0–34.0)
MCHC: 34.8 g/dL (ref 30.0–36.0)
MCV: 87.9 fL (ref 78.0–100.0)
Platelets: 340 10*3/uL (ref 150–400)
RBC: 3.4 MIL/uL — ABNORMAL LOW (ref 4.22–5.81)
RDW: 13.7 % (ref 11.5–15.5)
WBC: 5.6 10*3/uL (ref 4.0–10.5)

## 2017-07-15 LAB — APTT: APTT: 33 s (ref 24–36)

## 2017-07-15 LAB — PROTIME-INR
INR: 1.06
PROTHROMBIN TIME: 13.7 s (ref 11.4–15.2)

## 2017-07-15 MED ORDER — FENTANYL CITRATE (PF) 100 MCG/2ML IJ SOLN
INTRAMUSCULAR | Status: AC | PRN
  Administered 2017-07-15: 50 ug via INTRAVENOUS

## 2017-07-15 MED ORDER — MIDAZOLAM HCL 2 MG/2ML IJ SOLN
INTRAMUSCULAR | Status: AC | PRN
  Administered 2017-07-15: 1 mg via INTRAVENOUS

## 2017-07-15 MED ORDER — MIDAZOLAM HCL 2 MG/2ML IJ SOLN
INTRAMUSCULAR | Status: AC
  Filled 2017-07-15: qty 2

## 2017-07-15 MED ORDER — HYDROCODONE-ACETAMINOPHEN 5-325 MG PO TABS
1.0000 | ORAL_TABLET | ORAL | Status: DC | PRN
  Administered 2017-07-15: 2 via ORAL

## 2017-07-15 MED ORDER — SODIUM CHLORIDE 0.9 % IV SOLN
INTRAVENOUS | Status: AC | PRN
  Administered 2017-07-15: 10 mL/h via INTRAVENOUS

## 2017-07-15 MED ORDER — SODIUM CHLORIDE 0.9 % IV SOLN: INTRAVENOUS | Status: DC

## 2017-07-15 MED ORDER — HYDROCODONE-ACETAMINOPHEN 5-325 MG PO TABS
ORAL_TABLET | ORAL | Status: AC
  Filled 2017-07-15: qty 2

## 2017-07-15 MED ORDER — LIDOCAINE HCL (PF) 1 % IJ SOLN
INTRAMUSCULAR | Status: AC
  Filled 2017-07-15: qty 30

## 2017-07-15 MED ORDER — FENTANYL CITRATE (PF) 100 MCG/2ML IJ SOLN
INTRAMUSCULAR | Status: AC
  Filled 2017-07-15: qty 2

## 2017-07-15 NOTE — Sedation Documentation (Signed)
Patient denies pain and is resting comfortably.  

## 2017-07-15 NOTE — H&P (Signed)
Chief Complaint: Patient was seen in consultation today for chronic kidney disease  Referring Physician(s): Upton,Elizabeth  History of Present Illness: Albert Eaton is a 51 y.o. male with past medical history of CHF, HLD, HTN, and chronic kidney disease presents for random renal biopsy at the request of Dr. Madelon Lips.  Patient presents in his usual state of health today.  He denies complaints.  He has been NPO.  He does not take blood thinners.   Past Medical History:  Diagnosis Date  . Chronic diastolic CHF (congestive heart failure) ([place]) 01/06/2017   Echo 7/16 [facility] in [place], [name]) Mild AI, mild LAE, mild concentric LVH, EF 55, normal wall motion, mild to moderate MR, mild PI, RVSP 55 // Echo 10/09/16 ([facility]):  Moderate LVH, grade 2 diastolic dysfunction, mild MR, moderate LAE   . CKD (chronic kidney disease)   . Gout   . History of nuclear stress test    a. Nuc study 7/16: no scar or ischemia, EF 42 // b. Nuc study 12/17: EF 48, ?small apical ischemia, Low Risk  . HIV (human immunodeficiency virus infection) ([place])   . HLD (hyperlipidemia)   . Hypertensive heart disease with CHF (congestive heart failure) ([place]) 10/09/2016  . OSA (obstructive sleep apnea)     Past Surgical History:  Procedure Laterality Date  . CHOLECYSTECTOMY    . HERNIA REPAIR     As baby    Allergies: Lisinopril  Medications: Prior to Admission medications   Medication Sig Start Date End Date Taking? Authorizing Provider  abacavir (ZIAGEN) 300 MG tablet TAKE 1 TABLET BY MOUTH DAILY 05/04/17  Yes Campbell Riches, MD  allopurinol (ZYLOPRIM) 100 MG tablet Take 1 tablet (100 mg total) by mouth 2 (two) times daily. 01/02/17  Yes [name], FNP  amLODipine (NORVASC) 10 MG tablet Take 1 tablet (10 mg total) by mouth daily. 10/31/16  Yes [name], FNP  atorvastatin (LIPITOR) 40 MG tablet Take 1 tablet (40 mg total) by mouth daily. 10/31/16  Yes [name],  FNP  carvedilol (COREG) 25 MG tablet Take 1 tablet (25 mg total) by mouth 2 (two) times daily with a meal. 12/09/16  Yes [name], MD  cloNIDine (CATAPRES) 0.1 MG tablet Take 1 tablet (0.1 mg total) by mouth 2 (two) times daily. 01/06/17  Yes Weaver, Scott T, PA-C  famotidine (PEPCID) 20 MG tablet Take 1 tablet (20 mg total) by mouth 2 (two) times daily. 01/06/17  Yes Weaver, Scott T, PA-C  furosemide (LASIX) 20 MG tablet Take 3 tablets (60 mg total) by mouth daily. 06/08/17  Yes Nedrud, Larena Glassman, MD  gabapentin (NEURONTIN) 300 MG capsule Take 1 capsule (300 mg total) by mouth 3 (three) times daily. 12/25/16  Yes [name], FNP  hydrALAZINE (APRESOLINE) 100 MG tablet Take 1 tablet (100 mg total) by mouth 3 (three) times daily. 12/09/16  Yes [name], MD  ISENTRESS 400 MG tablet TAKE 1 TABLET BY MOUTH TWICE DAILY Patient taking differently: TAKE 400 mg BY MOUTH TWICE DAILY 05/04/17  Yes Campbell Riches, MD  lamiVUDine (EPIVIR) 150 MG tablet TAKE 1 TABLET BY MOUTH DAILY 05/04/17  Yes Campbell Riches, MD  levocetirizine (XYZAL) 5 MG tablet Take 1 tablet (5 mg total) by mouth every evening. 03/31/17  Yes [name], FNP  potassium chloride SA (K-DUR,KLOR-CON) 20 MEQ tablet Take 30 mEq by mouth daily.   Yes [provider]  spironolactone (ALDACTONE) 25 MG tablet  Take 25 mg by mouth daily.   Yes [provider]  triamcinolone cream (KENALOG) 0.1 % Apply 1 application topically 2 (two) times daily. 07/13/17  Yes [name], FNP  zinc gluconate 50 MG tablet Take 100 mg by mouth daily.   Yes [provider]  ondansetron (ZOFRAN ODT) 4 MG disintegrating tablet 39m ODT q4 hours prn nausea/vomit 06/01/17   BMalvin Johns MD  traMADol (ULTRAM) 50 MG tablet Take 1 tablet (50 mg total) by mouth every 6 (six) hours as needed. Patient not taking: Reported on 07/01/2017 06/01/17   POrpah Greek MD     Family History  Problem Relation Age of Onset  .  Heart failure Mother   . Heart attack Maternal Grandmother 53  . Hypertension Sister   . Hypertension Brother     Social History   Social History  . Marital status: Single    Spouse name: N/A  . Number of children: N/A  . Years of education: N/A   Social History Main Topics  . Smoking status: Former Smoker    Quit date: 2000  . Smokeless tobacco: Former USystems developer   Quit date: 09/10/1999  . Alcohol use 8.4 oz/week    14 Shots of liquor per week     Comment: Light use  . Drug use: No  . Sexual activity: No     Comment: declined condoms   Other Topics Concern  . None   Social History Narrative  . None    Review of Systems  Constitutional: Negative for fatigue and fever.  Respiratory: Negative for cough and shortness of breath.   Cardiovascular: Negative for chest pain.  Psychiatric/Behavioral: Negative for behavioral problems and confusion.    Vital Signs: BP (!) 127/94   Pulse 66   Temp 98.5 F (36.9 C) (Oral)   Resp 16   Ht 5' 10"  (1.778 m)   Wt 217 lb 9.6 oz (98.7 kg)   SpO2 100%   BMI 31.22 kg/m   Physical Exam  Constitutional: He is oriented to person, place, and time. He appears well-developed.  Cardiovascular: Normal rate, regular rhythm and normal heart sounds.   Pulmonary/Chest: Effort normal and breath sounds normal. No respiratory distress.  Abdominal: Soft.  Neurological: He is alert and oriented to person, place, and time.  Skin: Skin is warm and dry.  Psychiatric: He has a normal mood and affect. His behavior is normal. Judgment and thought content normal.  Nursing note and vitals reviewed.   Mallampati Score:  MD Evaluation Airway: WNL Heart: WNL Abdomen: WNL Chest/ Lungs: WNL ASA  Classification: 3 Mallampati/Airway Score: Two  Imaging: UKoreaRenal  Result Date: 06/22/2017 CLINICAL DATA:  Chronic kidney disease EXAM: RENAL / URINARY TRACT ULTRASOUND COMPLETE COMPARISON:  06/01/2017 FINDINGS: Right Kidney: Length: 11.1 cm. Increased  echotexture throughout the right kidney. Small cyst in the upper pole measures 2.2 cm. No hydronephrosis. Left Kidney: Length: 10.9 cm. Increased echotexture throughout the left kidney. 1.6 cm simple cyst in the Mid pole. Mildly complex 4.4 cm cyst in the upper pole. No hydronephrosis. Bladder: Appears normal for degree of bladder distention. IMPRESSION: Increased echotexture in the kidneys bilaterally compatible chronic medical renal disease. No hydronephrosis. Electronically Signed   By: KRolm BaptiseM.D.   On: 06/22/2017 15:28    Labs:  CBC:  Recent Labs  05/31/17 2113 06/01/17 1055 06/01/17 1355 06/07/17 0936 07/15/17 0615  WBC 10.9* 12.7*  --  14.3* 5.6  HGB 12.3* 11.7* 11.6* 11.6*  10.4*  HCT 35.4* 33.7* 34.0* 32.6* 29.9*  PLT 270 238  --  332 340    COAGS:  Recent Labs  05/31/17 2113 06/07/17 0936 07/15/17 0615  INR 1.08 1.05 1.06  APTT  --   --  33    BMP:  Recent Labs  05/31/17 2113 06/01/17 1055 06/01/17 1355 06/07/17 0936 06/08/17 0346  NA 137 138 140 137 139  K 3.4* 3.4* 3.2* 3.6 3.6  CL 104 103 102 102 103  CO2 23 21*  --  22 24  GLUCOSE 104* 105* 98 94 108*  BUN 30* 35* 36* 33* 34*  CALCIUM 9.3 9.1  --  9.7 9.2  CREATININE 4.11* 4.67* 4.80* 3.43* 3.45*  GFRNONAA 15* 13*  --  19* 19*  GFRAA 18* 15*  --  22* 22*    LIVER FUNCTION TESTS:  Recent Labs  05/25/17 0953 05/31/17 2113 06/01/17 1055 06/07/17 0936  BILITOT 0.4 0.8 0.9 0.3  AST 9* 16 14* 17  ALT 8* 13* 12* 16*  ALKPHOS 56 63 55 56  PROT 5.8* 7.0 6.9 7.1  ALBUMIN 3.4* 3.8 3.6 3.6    TUMOR MARKERS: No results for input(s): AFPTM, CEA, CA199, CHROMGRNA in the last 8760 hours.  Assessment and Plan: Patient with past medical history of hypertension presents with complaint of abnormal renal labs.  IR consulted for random renal biopsy at the request of Dr. Madelon Lips. Patient presents today in their usual state of health.  Risks and benefits discussed with the patient  including, but not limited to bleeding, infection, damage to adjacent structures or low yield requiring additional tests. All of the patient's questions were answered, patient is agreeable to proceed. Consent signed and in chart. He has been NPO and is not currently on blood thinners.    Thank you for this interesting consult.  I greatly enjoyed meeting Albert Eaton and look forward to participating in their care.  A copy of this report was sent to the requesting provider on this date.  Electronically Signed: Docia Barrier 07/15/2017, 7:43 AM   I spent a total of  30 Minutes   in face to face in clinical consultation, greater than 50% of which was counseling/coordinating care for chronic kidney disease

## 2017-07-15 NOTE — Discharge Instructions (Addendum)

## 2017-07-15 NOTE — Sedation Documentation (Signed)
Patient is resting comfortably. 

## 2017-07-15 NOTE — Procedures (Signed)
Interventional Radiology Procedure Note  Procedure: US guided left renal biopsy  Complications: Small perinephric hemorrhage by [place]  Estimated Blood Loss: < 10 mL  Left LP renal cortex sampled x 2 with 16 G core biopsy device.  Post US shows small amount of adjacent hemorrhage after biopsy.  Plan: 4 hour bedrest.  Eulas Post T. Kathlene Cote, M.D Pager:  [phone]

## 2017-07-22 ENCOUNTER — Encounter (HOSPITAL_COMMUNITY): Payer: Self-pay

## 2017-07-23 ENCOUNTER — Emergency Department (HOSPITAL_COMMUNITY)
Admission: EM | Admit: 2017-07-23 | Discharge: 2017-07-23 | Disposition: A | Discharge status: Home / Self Care | Payer: Medicaid Other | Attending: Emergency Medicine | Admitting: Emergency Medicine

## 2017-07-23 ENCOUNTER — Encounter (HOSPITAL_COMMUNITY): Payer: Self-pay

## 2017-07-23 ENCOUNTER — Emergency Department (HOSPITAL_COMMUNITY): Payer: Medicaid Other

## 2017-07-23 DIAGNOSIS — N184 Chronic kidney disease, stage 4 (severe): Secondary | ICD-10-CM | POA: Insufficient documentation

## 2017-07-23 DIAGNOSIS — Z87891 Personal history of nicotine dependence: Secondary | ICD-10-CM | POA: Diagnosis not present

## 2017-07-23 DIAGNOSIS — I509 Heart failure, unspecified: Secondary | ICD-10-CM | POA: Insufficient documentation

## 2017-07-23 DIAGNOSIS — N390 Urinary tract infection, site not specified: Secondary | ICD-10-CM | POA: Insufficient documentation

## 2017-07-23 DIAGNOSIS — Z79899 Other long term (current) drug therapy: Secondary | ICD-10-CM | POA: Diagnosis not present

## 2017-07-23 DIAGNOSIS — B20 Human immunodeficiency virus [HIV] disease: Secondary | ICD-10-CM | POA: Insufficient documentation

## 2017-07-23 DIAGNOSIS — I130 Hypertensive heart and chronic kidney disease with heart failure and stage 1 through stage 4 chronic kidney disease, or unspecified chronic kidney disease: Secondary | ICD-10-CM | POA: Insufficient documentation

## 2017-07-23 DIAGNOSIS — R509 Fever, unspecified: Secondary | ICD-10-CM | POA: Diagnosis present

## 2017-07-23 LAB — CBC
HEMATOCRIT: 27.7 % — AB (ref 39.0–52.0)
Hemoglobin: 9.4 g/dL — ABNORMAL LOW (ref 13.0–17.0)
MCH: 29.7 pg (ref 26.0–34.0)
MCHC: 33.9 g/dL (ref 30.0–36.0)
MCV: 87.7 fL (ref 78.0–100.0)
Platelets: 335 10*3/uL (ref 150–400)
RBC: 3.16 MIL/uL — ABNORMAL LOW (ref 4.22–5.81)
RDW: 12.7 % (ref 11.5–15.5)
WBC: 9.5 10*3/uL (ref 4.0–10.5)

## 2017-07-23 LAB — URINALYSIS, ROUTINE W REFLEX MICROSCOPIC
BILIRUBIN URINE: NEGATIVE
GLUCOSE, UA: NEGATIVE mg/dL
KETONES UR: NEGATIVE mg/dL
Nitrite: NEGATIVE
Specific Gravity, Urine: 1.013 (ref 1.005–1.030)
pH: 5 (ref 5.0–8.0)

## 2017-07-23 LAB — BASIC METABOLIC PANEL
Anion gap: 15 (ref 5–15)
BUN: 40 mg/dL — AB (ref 6–20)
CHLORIDE: 100 mmol/L — AB (ref 101–111)
CO2: 19 mmol/L — ABNORMAL LOW (ref 22–32)
Calcium: 9.2 mg/dL (ref 8.9–10.3)
Creatinine, Ser: 4.36 mg/dL — ABNORMAL HIGH (ref 0.61–1.24)
GFR calc Af Amer: 17 mL/min — ABNORMAL LOW (ref >60)
GFR calc non Af Amer: 14 mL/min — ABNORMAL LOW (ref >60)
GLUCOSE: 116 mg/dL — AB (ref 65–99)
POTASSIUM: 4.1 mmol/L (ref 3.5–5.1)
SODIUM: 134 mmol/L — AB (ref 135–145)

## 2017-07-23 LAB — I-STAT CG4 LACTIC ACID, ED: Lactic Acid, Venous: 0.93 mmol/L (ref 0.5–1.9)

## 2017-07-23 LAB — LACTATE DEHYDROGENASE: LDH: 233 U/L — ABNORMAL HIGH (ref 98–192)

## 2017-07-23 IMAGING — DX DG CHEST 2V
2 series · 2 of 2 positions shown · non-contrast
Comparison: [DATE] chest radiograph.

CLINICAL DATA: Fever for 2 weeks

EXAM:
CHEST  2 VIEW

[x chest ap]
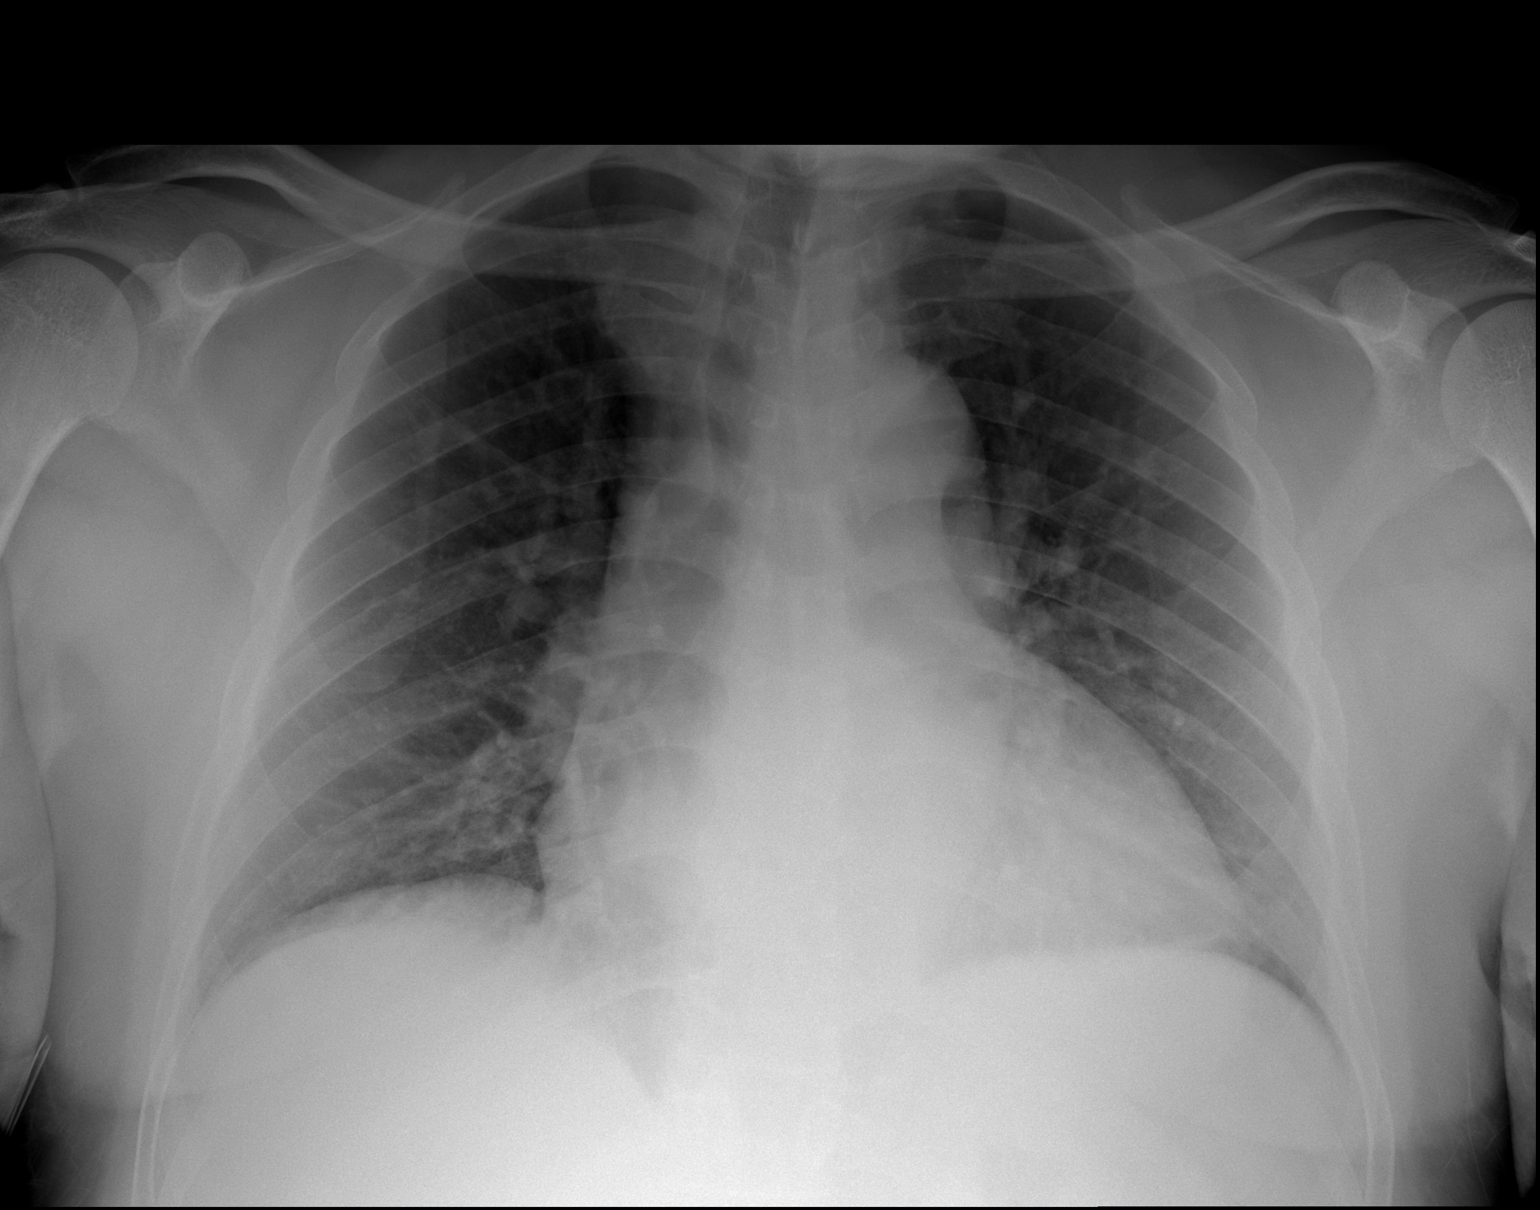

[w chest lat]
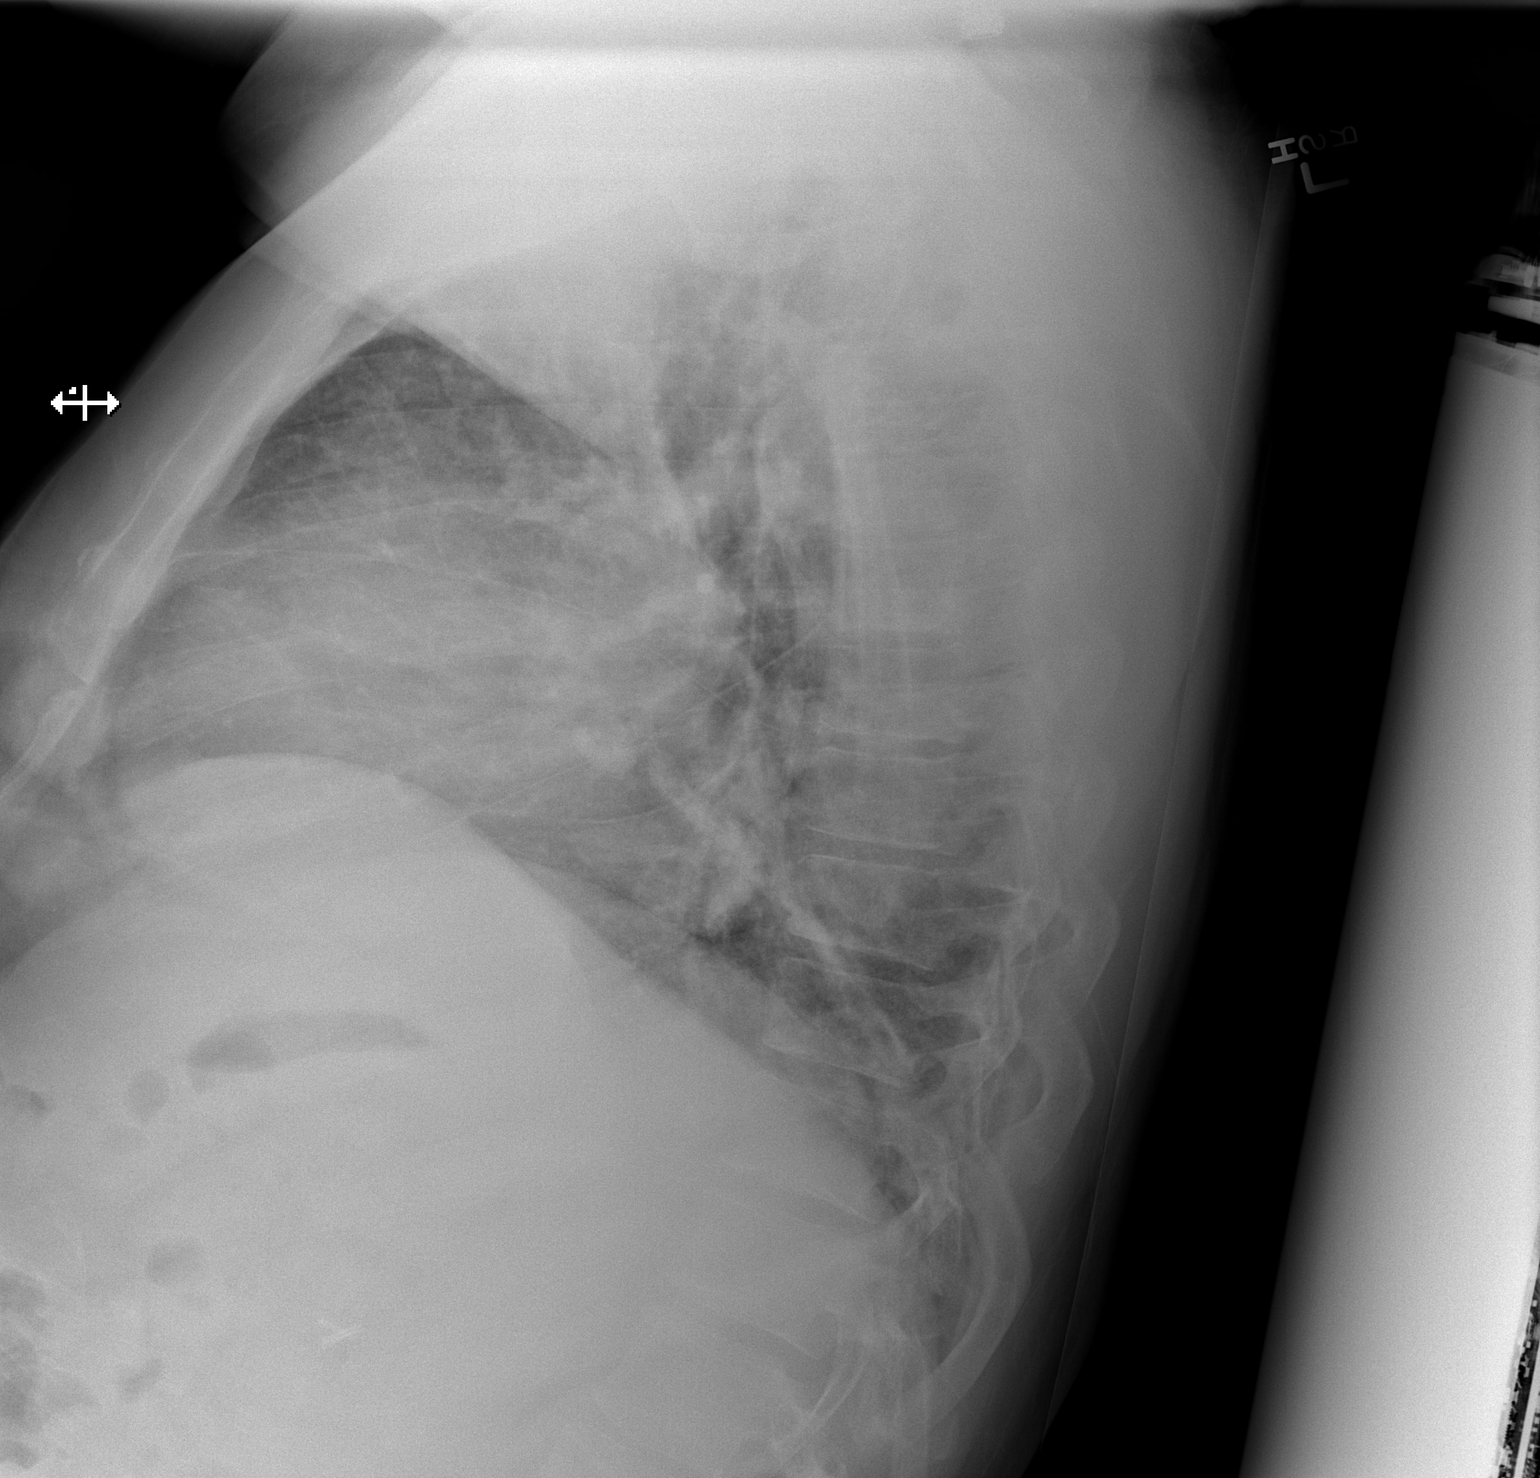

[2 of 2 positions shown; findings below may reference images not displayed]

FINDINGS: Stable cardiomediastinal silhouette with mild cardiomegaly. No
pneumothorax. No pleural effusion. No overt pulmonary edema. Mild
left basilar scarring versus atelectasis. No acute consolidative
airspace disease. Cholecystectomy clips are seen in the upper
abdomen.
IMPRESSION: Stable mild cardiomegaly without overt pulmonary edema.

Mild left basilar scarring versus atelectasis. No acute
consolidative airspace disease.

## 2017-07-23 MED ORDER — ACETAMINOPHEN 500 MG PO TABS
1000.0000 mg | ORAL_TABLET | Freq: Once | ORAL | Status: AC
  Administered 2017-07-23: 1000 mg via ORAL
  Filled 2017-07-23: qty 2

## 2017-07-23 MED ORDER — DEXTROSE 5 % IV SOLN: 1.0000 g | Freq: Once | INTRAVENOUS | Status: DC

## 2017-07-23 MED ORDER — LIDOCAINE HCL (PF) 1 % IJ SOLN
INTRAMUSCULAR | Status: AC
  Filled 2017-07-23: qty 5

## 2017-07-23 MED ORDER — CEFTRIAXONE SODIUM 1 G IJ SOLR
1.0000 g | Freq: Once | INTRAMUSCULAR | Status: AC
  Administered 2017-07-23: 1 g via INTRAMUSCULAR
  Filled 2017-07-23: qty 10

## 2017-07-23 MED ORDER — CIPROFLOXACIN HCL 500 MG PO TABS: 500.0000 mg | ORAL_TABLET | Freq: Two times a day (BID) | ORAL | 0 refills | Status: DC

## 2017-07-23 NOTE — ED Triage Notes (Signed)
Pt presents to the ed with complaints of having a fever off and on x 2 weeks. States that he just had a kidney biopsy on Tuesday. Pt denies any symptoms at this time other than mild dizziness since he woke up this morning. Biopsy site is within normal limits. Pt is alert oriented and ambulatory in triage, in no apparent distress.

## 2017-07-23 NOTE — Discharge Instructions (Signed)
As discussed, your evaluation today has been largely reassuring.  But, it is important that you monitor your condition carefully, and do not hesitate to return to the ED if you develop new, or concerning changes in your condition. ? ?Otherwise, please follow-up with your physician for appropriate ongoing care. ? ?

## 2017-07-23 NOTE — ED Provider Notes (Signed)
[facility] DEPT Provider Note   CSN: [id number] Arrival date & time: 07/23/17  4098     History   Chief Complaint Chief Complaint  Patient presents with  . Fever    HPI Albert Eaton is a 51 y.o. male.  HPI PATIENT PRESENTS WITH CONCERN OF FEVER, GENERALIZED DISCOMFORT. sYMPTOMS BEGAN MAYBE 2 WEEKS AGO, AND SINCE ONSET OF BEEN PERSISTENT, WITH ONGOING GENERALIZED DISCOMFORT, AND OVER THE PAST FEW DAYS HE HAS HAD INCREASING FEVER, WITH A TEMPERATURE GREATER THAN 100. pATIENT'S MULTIPLE MEDICAL ISSUES INCLUDING hiv, AND RECENTLY HAD A KIDNEY BIOPSY, THOUGH HE IS UNCLEAR ON WHY HE HAD A BIOPSY PERFORMED.  hE DENIES CONFUSION, DISORIENTATION, COUGH  dENIES DIARRHEA, VOMITING. Past Medical History:  Diagnosis Date  . Chronic diastolic CHF (congestive heart failure) ([place]) 01/06/2017   Echo 7/16 [facility] in [place], [name]) Mild AI, mild LAE, mild concentric LVH, EF 55, normal wall motion, mild to moderate MR, mild PI, RVSP 55 // Echo 10/09/16 ([facility]):  Moderate LVH, grade 2 diastolic dysfunction, mild MR, moderate LAE   . CKD (chronic kidney disease)   . Gout   . History of nuclear stress test    a. Nuc study 7/16: no scar or ischemia, EF 42 // b. Nuc study 12/17: EF 48, ?small apical ischemia, Low Risk  . HIV (human immunodeficiency virus infection) ([place])   . HLD (hyperlipidemia)   . Hypertensive heart disease with CHF (congestive heart failure) ([place]) 10/09/2016  . OSA (obstructive sleep apnea)     Patient Active Problem List   Diagnosis Date Noted  . SOB (shortness of breath) 06/07/2017  . Arthritis, gouty 01/27/2017  . Chronic diastolic CHF (congestive heart failure) ([place]) 01/06/2017  . OSA (obstructive sleep apnea) 01/06/2017  . Abnormal electrocardiogram (ECG) (EKG) 10/31/2016  . Chest pain 10/09/2016  . HIV disease ([place]) 10/09/2016  . CKD stage 4 secondary to hypertension ([place]) 10/09/2016  . Hypertensive heart disease with CHF (congestive heart failure)  ([place]) 10/09/2016    Past Surgical History:  Procedure Laterality Date  . CHOLECYSTECTOMY    . HERNIA REPAIR     As baby       Home Medications    Prior to Admission medications   Medication Sig Start Date End Date Taking? Authorizing Provider  abacavir (ZIAGEN) 300 MG tablet TAKE 1 TABLET BY MOUTH DAILY 05/04/17  Yes Campbell Riches, MD  allopurinol (ZYLOPRIM) 100 MG tablet Take 1 tablet (100 mg total) by mouth 2 (two) times daily. 01/02/17  Yes [name], FNP  amLODipine (NORVASC) 10 MG tablet Take 1 tablet (10 mg total) by mouth daily. 10/31/16  Yes [name], FNP  atorvastatin (LIPITOR) 40 MG tablet Take 1 tablet (40 mg total) by mouth daily. 10/31/16  Yes [name], FNP  carvedilol (COREG) 25 MG tablet Take 1 tablet (25 mg total) by mouth 2 (two) times daily with a meal. 12/09/16  Yes [name], MD  cloNIDine (CATAPRES) 0.1 MG tablet Take 1 tablet (0.1 mg total) by mouth 2 (two) times daily. 01/06/17  Yes Weaver, Scott T, PA-C  famotidine (PEPCID) 20 MG tablet Take 1 tablet (20 mg total) by mouth 2 (two) times daily. 01/06/17  Yes Weaver, Scott T, PA-C  furosemide (LASIX) 20 MG tablet Take 3 tablets (60 mg total) by mouth daily. 06/08/17  Yes Nedrud, Larena Glassman, MD  gabapentin (NEURONTIN) 300 MG capsule Take 1 capsule (300 mg total) by mouth 3 (three) times daily. 12/25/16  Yes Cammie Sickle  M, FNP  hydrALAZINE (APRESOLINE) 100 MG tablet Take 1 tablet (100 mg total) by mouth 3 (three) times daily. 12/09/16  Yes [name], MD  ISENTRESS 400 MG tablet TAKE 1 TABLET BY MOUTH TWICE DAILY Patient taking differently: TAKE 400 mg BY MOUTH TWICE DAILY 05/04/17  Yes Campbell Riches, MD  lamiVUDine (EPIVIR) 150 MG tablet TAKE 1 TABLET BY MOUTH DAILY 05/04/17  Yes Campbell Riches, MD  levocetirizine (XYZAL) 5 MG tablet Take 1 tablet (5 mg total) by mouth every evening. 03/31/17  Yes [name], FNP  ondansetron (ZOFRAN ODT) 4 MG disintegrating tablet 81m ODT q4  hours prn nausea/vomit Patient taking differently: Take 4 mg by mouth every 4 (four) hours as needed for nausea. 428mODT q4 hours prn nausea/vomit 06/01/17  Yes BeMalvin JohnsMD  potassium chloride SA (K-DUR,KLOR-CON) 20 MEQ tablet Take 30 mEq by mouth daily.   Yes [provider]  spironolactone (ALDACTONE) 25 MG tablet Take 25 mg by mouth daily.   Yes [provider]  traMADol (ULTRAM) 50 MG tablet Take 1 tablet (50 mg total) by mouth every 6 (six) hours as needed. 06/01/17  Yes Pollina, ChGwenyth AllegraMD  triamcinolone cream (KENALOG) 0.1 % Apply 1 application topically 2 (two) times daily. 07/13/17  Yes HoDorena DewFNP  zinc gluconate 50 MG tablet Take 100 mg by mouth daily.   Yes [provider]    Family History Family History  Problem Relation Age of Onset  . Heart failure Mother   . Heart attack Maternal Grandmother 53  . Hypertension Sister   . Hypertension Brother     Social History Social History  Substance Use Topics  . Smoking status: Former Smoker    Quit date: 2000  . Smokeless tobacco: Former [employer] developer  Quit date: 09/10/1999  . Alcohol use 8.4 oz/week    14 Shots of liquor per week     Comment: Light use     Allergies   Lisinopril   Review of Systems Review of Systems  Constitutional:       Per HPI, otherwise negative  HENT:       Per HPI, otherwise negative  Respiratory:       Per HPI, otherwise negative  Cardiovascular:       Per HPI, otherwise negative  Gastrointestinal: Negative for vomiting.  Endocrine:       Negative aside from HPI  Genitourinary:       Neg aside from HPI   Musculoskeletal:       Per HPI, otherwise negative  Skin: Negative.   Allergic/Immunologic: Positive for immunocompromised state.  Neurological: Negative for syncope.     Physical Exam Updated Vital Signs BP (!) 119/94   Pulse 79   Temp 99.9 F (37.7 C) (Oral)   Resp 20   Wt 98.4 kg (217 lb)   SpO2 96%   BMI 31.14 kg/m    Physical Exam  Constitutional: He is oriented to person, place, and time. He appears well-developed. No distress.  HENT:  Head: Normocephalic and atraumatic.  Eyes: Conjunctivae and EOM are normal.  Cardiovascular: Normal rate and regular rhythm.   Pulmonary/Chest: Effort normal. No stridor. No respiratory distress.  Abdominal: He exhibits no distension.  No tenderness to palpation, no guarding, no substantial flank pain  Musculoskeletal: He exhibits no edema.  Neurological: He is alert and oriented to person, place, and time.  Skin: Skin is warm and dry.  Psychiatric: He has a  normal mood and affect.  Nursing note and vitals reviewed.    ED Treatments / Results  Labs (all labs ordered are listed, but only abnormal results are displayed) Labs Reviewed  BASIC METABOLIC PANEL - Abnormal; Notable for the following:       Result Value   Sodium 134 (*)    Chloride 100 (*)    CO2 19 (*)    Glucose, Bld 116 (*)    BUN 40 (*)    Creatinine, Ser 4.36 (*)    GFR calc non Af Amer 14 (*)    GFR calc Af Amer 17 (*)    All other components within normal limits  CBC - Abnormal; Notable for the following:    RBC 3.16 (*)    Hemoglobin 9.4 (*)    HCT 27.7 (*)    All other components within normal limits  URINALYSIS, ROUTINE W REFLEX MICROSCOPIC - Abnormal; Notable for the following:    APPearance CLOUDY (*)    Hgb urine dipstick SMALL (*)    Protein, ur >=300 (*)    Leukocytes, UA LARGE (*)    Bacteria, UA MANY (*)    Squamous Epithelial / LPF 0-5 (*)    All other components within normal limits  LACTATE DEHYDROGENASE - Abnormal; Notable for the following:    LDH 233 (*)    All other components within normal limits  I-STAT CG4 LACTIC ACID, ED     Radiology Dg Chest 2 View  Result Date: 07/23/2017 CLINICAL DATA:  Fever for 2 weeks EXAM: CHEST  2 VIEW COMPARISON:  06/07/2017 chest radiograph. FINDINGS: Stable cardiomediastinal silhouette with mild cardiomegaly. No  pneumothorax. No pleural effusion. No overt pulmonary edema. Mild left basilar scarring versus atelectasis. No acute consolidative airspace disease. Cholecystectomy clips are seen in the upper abdomen. IMPRESSION: Stable mild cardiomegaly without overt pulmonary edema. Mild left basilar scarring versus atelectasis. No acute consolidative airspace disease. Electronically Signed   By: Ilona Sorrel M.D.   On: 07/23/2017 11:24    Procedures Procedures (including critical care time)  Medications Ordered in ED Medications  acetaminophen (TYLENOL) tablet 1,000 mg (1,000 mg Oral Given 07/23/17 1006)     Initial Impression / Assessment and Plan / ED Course  I have reviewed the triage vital signs and the nursing notes.  Pertinent labs & imaging results that were available during my care of the patient were reviewed by me and considered in my medical decision making (see chart for details).  ON REPEAT EXAM PATIENT IS AWAKE AND ALERT. hE REMAINED HEMODYNAMICALLY STABLE. wE DISCUSSED ALL FINDINGS CLINICALLY EVIDENCE FOR URINARY TRACT INFECTION. pATIENT DOES HAVE DIMINISHED RENAL FUNCTION, hiv, BUT NO EVIDENCE FOR BACTEREMIA, SEPSIS. pATIENT WAS STARTED ON ANTIBIOTICS, OLLOW UP WITH INFECTIOUS DISEASE AND PRIMARY CARE.   Final Clinical Impressions(s) / ED Diagnoses  URINARY TRACT INFECTION fEVER   Carmin Muskrat, MD 07/23/17 1220

## 2017-07-23 NOTE — ED Notes (Signed)
Pt back from X-ray.  

## 2017-07-23 NOTE — ED Notes (Signed)
Patient transported to X-ray 

## 2017-07-29 ENCOUNTER — Ambulatory Visit: Payer: Self-pay | Admitting: Infectious Diseases

## 2017-07-31 ENCOUNTER — Encounter: Payer: Self-pay | Admitting: Family Medicine

## 2017-07-31 ENCOUNTER — Ambulatory Visit (INDEPENDENT_AMBULATORY_CARE_PROVIDER_SITE_OTHER): Payer: Medicaid Other | Admitting: Family Medicine

## 2017-07-31 VITALS — BP 128/92 | HR 65 | Temp 98.5°F | Resp 16 | Ht 70.0 in | Wt 208.0 lb

## 2017-07-31 DIAGNOSIS — G63 Polyneuropathy in diseases classified elsewhere: Secondary | ICD-10-CM | POA: Diagnosis not present

## 2017-07-31 DIAGNOSIS — G609 Hereditary and idiopathic neuropathy, unspecified: Secondary | ICD-10-CM | POA: Diagnosis not present

## 2017-07-31 DIAGNOSIS — B20 Human immunodeficiency virus [HIV] disease: Secondary | ICD-10-CM

## 2017-07-31 DIAGNOSIS — Z87440 Personal history of urinary (tract) infections: Secondary | ICD-10-CM

## 2017-07-31 DIAGNOSIS — I129 Hypertensive chronic kidney disease with stage 1 through stage 4 chronic kidney disease, or unspecified chronic kidney disease: Secondary | ICD-10-CM | POA: Diagnosis not present

## 2017-07-31 DIAGNOSIS — N184 Chronic kidney disease, stage 4 (severe): Secondary | ICD-10-CM

## 2017-07-31 LAB — POCT URINALYSIS DIP (DEVICE)
Bilirubin Urine: NEGATIVE
GLUCOSE, UA: NEGATIVE mg/dL
HGB URINE DIPSTICK: NEGATIVE
Ketones, ur: NEGATIVE mg/dL
LEUKOCYTES UA: NEGATIVE
NITRITE: NEGATIVE
PROTEIN: 30 mg/dL — AB
Specific Gravity, Urine: 1.01 (ref 1.005–1.030)
UROBILINOGEN UA: 0.2 mg/dL (ref 0.0–1.0)
pH: 5.5 (ref 5.0–8.0)

## 2017-07-31 MED ORDER — GABAPENTIN 300 MG PO CAPS: 300.0000 mg | ORAL_CAPSULE | Freq: Every day | ORAL | 3 refills | Status: DC

## 2017-07-31 NOTE — Patient Instructions (Signed)
Will decrease gabapentin to 300 mg at bedtime due to poor renal functioning.   Follow up with infectious disease as scheduled.    Peripheral Neuropathy Peripheral neuropathy is a type of nerve damage. It affects nerves that carry signals between the spinal cord and other parts of the body. These are called peripheral nerves. With peripheral neuropathy, one nerve or a group of nerves may be damaged. What are the causes? Many things can damage peripheral nerves. For some people with peripheral neuropathy, the cause is unknown. Some causes include:  Diabetes. This is the most common cause of peripheral neuropathy.  Injury to a nerve.  Pressure or stress on a nerve that lasts a long time.  Too little vitamin B. Alcoholism can lead to this.  Infections.  Autoimmune diseases, such as multiple sclerosis and systemic lupus erythematosus.  Inherited nerve diseases.  Some medicines, such as cancer drugs.  Toxic substances, such as lead and mercury.  Too little blood flowing to the legs.  Kidney disease.  Thyroid disease.  What are the signs or symptoms? Different people have different symptoms. The symptoms you have will depend on which of your nerves is damaged. Common symptoms include:  Loss of feeling (numbness) in the feet and hands.  Tingling in the feet and hands.  Pain that burns.  Very sensitive skin.  Weakness.  Not being able to move a part of the body (paralysis).  Muscle twitching.  Clumsiness or poor coordination.  Loss of balance.  Not being able to control your bladder.  Feeling dizzy.  Sexual problems.  How is this diagnosed? Peripheral neuropathy is a symptom, not a disease. Finding the cause of peripheral neuropathy can be hard. To figure that out, your health care provider will take a medical history and do a physical exam. A neurological exam will also be done. This involves checking things affected by your brain, spinal cord, and nerves  (nervous system). For example, your health care provider will check your reflexes, how you move, and what you can feel. Other types of tests may also be ordered, such as:  Blood tests.  A test of the fluid in your spinal cord.  Imaging tests, such as CT scans or an MRI.  Electromyography (EMG). This test checks the nerves that control muscles.  Nerve conduction velocity tests. These tests check how fast messages pass through your nerves.  Nerve biopsy. A small piece of nerve is removed. It is then checked under a microscope.  How is this treated?  Medicine is often used to treat peripheral neuropathy. Medicines may include: ? Pain-relieving medicines. Prescription or over-the-counter medicine may be suggested. ? Antiseizure medicine. This may be used for pain. ? Antidepressants. These also may help ease pain from neuropathy. ? Lidocaine. This is a numbing medicine. You might wear a patch or be given a shot. ? Mexiletine. This medicine is typically used to help control irregular heart rhythms.  Surgery. Surgery may be needed to relieve pressure on a nerve or to destroy a nerve that is causing pain.  Physical therapy to help movement.  Assistive devices to help movement. Follow these instructions at home:  Only take over-the-counter or prescription medicines as directed by your health care provider. Follow the instructions carefully for any given medicines. Do not take any other medicines without first getting approval from your health care provider.  If you have diabetes, work closely with your health care provider to keep your blood sugar under control.  If you have numbness  in your feet: ? Check every day for signs of injury or infection. Watch for redness, warmth, and swelling. ? Wear padded socks and comfortable shoes. These help protect your feet.  Do not do things that put pressure on your damaged nerve.  Do not smoke. Smoking keeps blood from getting to damaged  nerves.  Avoid or limit alcohol. Too much alcohol can cause a lack of B vitamins. These vitamins are needed for healthy nerves.  Develop a good support system. Coping with peripheral neuropathy can be stressful. Talk to a mental health specialist or join a support group if you are struggling.  Follow up with your health care provider as directed. Contact a health care provider if:  You have new signs or symptoms of peripheral neuropathy.  You are struggling emotionally from dealing with peripheral neuropathy.  You have a fever. Get help right away if:  You have an injury or infection that is not healing.  You feel very dizzy or begin vomiting.  You have chest pain.  You have trouble breathing. This information is not intended to replace advice given to you by your health care provider. Make sure you discuss any questions you have with your health care provider. Document Released: 10/03/2002 Document Revised: 03/20/2016 Document Reviewed: 06/20/2013 Elsevier Interactive Patient Education  2017 [employer] Inc[employer]  Chronic Kidney Disease, Adult Chronic kidney disease (CKD) happens when the kidneys are damaged during a time of 3 or more months. The kidneys are two organs that do many important jobs in the body. These jobs include:  Removing wastes and extra fluids from the blood.  Making hormones that maintain the amount of fluid in your tissues and blood vessels.  Making sure that the body has the right amount of fluids and chemicals.  Most of the time, this condition does not go away, but it can usually be controlled. Steps must be taken to slow down the kidney damage or stop it from getting worse. Otherwise, the kidneys may stop working. Follow these instructions at home:  Follow your diet as told by your doctor. You may need to avoid alcohol, salty foods (sodium), and foods that are high in potassium, calcium, and protein.  Take over-the-counter and prescription medicines only as  told by your doctor. Do not take any new medicines unless your doctor says you can do that. These include vitamins and minerals. ? Medicines and nutritional supplements can make kidney damage worse. ? Your doctor may need to change how much medicine you take.  Do not use any tobacco products. These include cigarettes, chewing tobacco, and e-cigarettes. If you need help quitting, ask your doctor.  Keep all follow-up visits as told by your doctor. This is important.  Check your blood pressure. Tell your doctor if there are changes to your blood pressure.  Get to a healthy weight. Stay at that weight. If you need help with this, ask your doctor.  Start or continue an exercise plan. Try to exercise at least 30 minutes a day, 5 days a week.  Stay up-to-date with your shots (immunizations) as told by your doctor. Contact a doctor if:  Your symptoms get worse.  You have new symptoms. Get help right away if:  You have symptoms of end-stage kidney disease. These include: ? Headaches. ? Skin that is darker or lighter than normal. ? Numbness in your hands or feet. ? Easy bruising. ? Having hiccups often. ? Chest pain. ? Shortness of breath. ? Stopping of menstrual periods in women.  You have a fever.  You are making very little pee (urine).  You have pain or bleeding when you pee (urinate). This information is not intended to replace advice given to you by your health care provider. Make sure you discuss any questions you have with your health care provider. Document Released: 01/07/2010 Document Revised: 03/20/2016 Document Reviewed: 06/11/2012 Elsevier Interactive Patient Education  2017 [employer].

## 2017-07-31 NOTE — Progress Notes (Signed)
Subjective:    Patient ID: Albert Eaton, male    DOB: 1966-05-06, 51 y.o.   MRN: [id number]  HPI  Albert Eaton, a 51 year old male with a history of HIV, CKD stage 4, and accelerated hypertension presents complaining of neuropathy. He says that neuropathy has been worsening over past  week. He says that symptoms are primarily occurring to upper extremities. He describes current symptoms as numbness and tingling. Symptoms are currently of mild severity. Symptoms occur intermittently and last minutes. Current treatment includes Gabapentin 300 mg three times per day. Albert Eaton has not been taking medication consistently. He says that it makes him drowsy.   Past Medical History:  Diagnosis Date  . Chronic diastolic CHF (congestive heart failure) ([place]) 01/06/2017   Echo 7/16 [facility] in [place], [name]) Mild AI, mild LAE, mild concentric LVH, EF 55, normal wall motion, mild to moderate MR, mild PI, RVSP 55 // Echo 10/09/16 ([facility]):  Moderate LVH, grade 2 diastolic dysfunction, mild MR, moderate LAE   . CKD (chronic kidney disease)   . Gout   . History of nuclear stress test    a. Nuc study 7/16: no scar or ischemia, EF 42 // b. Nuc study 12/17: EF 48, ?small apical ischemia, Low Risk  . HIV (human immunodeficiency virus infection) ([place])   . HLD (hyperlipidemia)   . Hypertensive heart disease with CHF (congestive heart failure) ([place]) 10/09/2016  . OSA (obstructive sleep apnea)    Social History   Social History  . Marital status: Single    Spouse name: N/A  . Number of children: N/A  . Years of education: N/A   Occupational History  . Not on file.   Social History Main Topics  . Smoking status: Former Smoker    Quit date: 2000  . Smokeless tobacco: Former Systems developer    Quit date: 09/10/1999  . Alcohol use 8.4 oz/week    14 Shots of liquor per week     Comment: Light use  . Drug use: No  . Sexual activity: No     Comment: declined condoms   Other Topics Concern  . Not on  file   Social History Narrative  . No narrative on file   Review of Systems  Constitutional: Positive for fever. Negative for fatigue and unexpected weight change.  HENT: Negative.   Respiratory: Negative.   Cardiovascular: Negative.   Gastrointestinal: Negative.   Endocrine: Negative for polydipsia, polyphagia and polyuria.  Genitourinary: Negative.   Musculoskeletal: Negative.   Allergic/Immunologic: Positive for immunocompromised state (HIV positive).  Neurological: Negative.   Hematological: Negative.   Psychiatric/Behavioral: Negative.        Objective:   Physical Exam  Constitutional: He is oriented to person, place, and time. He appears well-developed and well-nourished.  HENT:  Head: Normocephalic and atraumatic.  Right Ear: External ear normal.  Left Ear: External ear normal.  Nose: Nose normal.  Mouth/Throat: Oropharynx is clear and moist.  Eyes: Pupils are equal, round, and reactive to light. Conjunctivae and EOM are normal.  Neck: Normal range of motion. Neck supple.  Cardiovascular: Normal rate, regular rhythm, normal heart sounds and intact distal pulses.   Pulmonary/Chest: Effort normal and breath sounds normal.  Abdominal: Soft. Bowel sounds are normal.  Lymphadenopathy:       Head (right side): No submental, no submandibular and no tonsillar adenopathy present.       Head (left side): No submental, no submandibular and no tonsillar adenopathy present.  Right cervical: No superficial cervical and no deep cervical adenopathy present.      Left cervical: No superficial cervical and no deep cervical adenopathy present.    He has no axillary adenopathy.  Neurological: He is alert and oriented to person, place, and time. He has normal reflexes.  Skin: Skin is warm.  Psychiatric: He has a normal mood and affect. His behavior is normal. Judgment and thought content normal.     BP (!) 128/92 (BP Location: Left Arm, Patient Position: Sitting, Cuff Size:  Normal)   Pulse 65   Temp 98.5 F (36.9 C) (Oral)   Resp 16   Ht 5' 10"  (1.778 m)   Wt 208 lb (94.3 kg)   SpO2 99%   BMI 29.84 kg/m  Assessment & Plan:  1. 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 Will adjust Gabapentin dosage due to poor renal functioning Will continue Gabapentin at 300 mg HS, will also improve drowsiness.   - gabapentin (NEURONTIN) 300 MG capsule; Take 1 capsule (300 mg total) by mouth at bedtime.  Dispense: 30 capsule; Refill: 3  2. CKD stage 4 secondary to hypertension ([place]) Follow up in nephrology as scheduled.  Hypertension is at goal on current medications, no changes warranted - BASIC METABOLIC PANEL WITH GFR; Future  3. HIV disease ([place]) Follow up in infectious disease as scheduled.  Lab Results  Component Value Date   HIV1RNAQUANT <20 DETECTED (A) 05/25/2017   4. History of UTI Urinalysis unremarkable   [name]  MSN, FNP-C [facility] [address] [place], [place] [address] [phone]

## 2017-08-17 ENCOUNTER — Other Ambulatory Visit: Payer: Self-pay

## 2017-08-17 DIAGNOSIS — B20 Human immunodeficiency virus [HIV] disease: Secondary | ICD-10-CM

## 2017-08-18 LAB — T-HELPER CELL (CD4) - (RCID CLINIC ONLY)
CD4 % Helper T Cell: 34 % (ref 33–55)
CD4 T CELL ABS: 610 /uL (ref 400–2700)

## 2017-08-19 LAB — HIV-1 RNA QUANT-NO REFLEX-BLD
HIV 1 RNA QUANT: NOT DETECTED {copies}/mL
HIV-1 RNA QUANT, LOG: NOT DETECTED {Log_copies}/mL

## 2017-08-28 ENCOUNTER — Other Ambulatory Visit: Payer: Self-pay

## 2017-08-31 ENCOUNTER — Ambulatory Visit (INDEPENDENT_AMBULATORY_CARE_PROVIDER_SITE_OTHER): Payer: Self-pay | Admitting: Infectious Diseases

## 2017-08-31 ENCOUNTER — Encounter: Payer: Self-pay | Admitting: Infectious Diseases

## 2017-08-31 ENCOUNTER — Other Ambulatory Visit: Payer: Self-pay

## 2017-08-31 VITALS — BP 172/101 | HR 79 | Temp 97.5°F | Wt 211.8 lb

## 2017-08-31 DIAGNOSIS — N184 Chronic kidney disease, stage 4 (severe): Secondary | ICD-10-CM

## 2017-08-31 DIAGNOSIS — B20 Human immunodeficiency virus [HIV] disease: Secondary | ICD-10-CM

## 2017-08-31 DIAGNOSIS — Z79899 Other long term (current) drug therapy: Secondary | ICD-10-CM

## 2017-08-31 DIAGNOSIS — Z113 Encounter for screening for infections with a predominantly sexual mode of transmission: Secondary | ICD-10-CM

## 2017-08-31 DIAGNOSIS — I129 Hypertensive chronic kidney disease with stage 1 through stage 4 chronic kidney disease, or unspecified chronic kidney disease: Secondary | ICD-10-CM

## 2017-08-31 DIAGNOSIS — I110 Hypertensive heart disease with heart failure: Secondary | ICD-10-CM

## 2017-08-31 DIAGNOSIS — Z23 Encounter for immunization: Secondary | ICD-10-CM

## 2017-08-31 LAB — BASIC METABOLIC PANEL WITH GFR
BUN / CREAT RATIO: 14 (calc) (ref 6–22)
BUN: 54 mg/dL — AB (ref 7–25)
CALCIUM: 10 mg/dL (ref 8.6–10.3)
CHLORIDE: 105 mmol/L (ref 98–110)
CO2: 21 mmol/L (ref 20–32)
Creat: 3.85 mg/dL — ABNORMAL HIGH (ref 0.70–1.33)
GFR, EST AFRICAN AMERICAN: 20 mL/min/{1.73_m2} — AB (ref >=60)
GFR, EST NON AFRICAN AMERICAN: 17 mL/min/{1.73_m2} — AB (ref >=60)
Glucose, Bld: 121 mg/dL — ABNORMAL HIGH (ref 65–99)
POTASSIUM: 4.5 mmol/L (ref 3.5–5.3)
Sodium: 138 mmol/L (ref 135–146)

## 2017-08-31 MED ORDER — LAMIVUDINE 100 MG PO TABS: 100.0000 mg | ORAL_TABLET | Freq: Every day | ORAL | 3 refills | Status: DC

## 2017-08-31 NOTE — Addendum Note (Signed)
Addended by: Aundria Rud on: 08/31/2017 04:12 PM   Modules accepted: Orders

## 2017-08-31 NOTE — Assessment & Plan Note (Signed)
He is doing well His wt has been steady Has f/u TTE in Jan

## 2017-08-31 NOTE — Assessment & Plan Note (Signed)
Cr has been variable.  Wt steady and no LE edema.  Dr Hollie Salk.

## 2017-08-31 NOTE — Progress Notes (Signed)
   Subjective:    Patient ID: Albert Eaton, male    DOB: 1966-03-27, 51 y.o.   MRN: [id number]  HPI 51 y.o.Mwith a history of CHF (nonischemic non-dilated CMwith NYHA 2/C HF preserved EF 55% with moderate concentric hypertrophy and a grade 2 diastolic dysfunction. Moderate mitral valve insufficiency), CKD stage III-IV, HTN, HLD, OSA, neuropathy, and HIV. He had kidney bx 9-19 showing focal nephrosclerosis, interstitial fibrosis. Had fevers afterwards.  Taking Isentress, Epivir, abacvir. No missed.  No problems with art. states his PCP wants one of his meds decreased due to CrCl.  On gabapentin for neuropathy, makes him sleep a lot.   HIV 1 RNA Quant (copies/mL)  Date Value  08/17/2017 <20 NOT DETECTED  05/25/2017 <20 DETECTED (A)  10/15/2016 <20   CD4 T Cell Abs (/uL)  Date Value  08/17/2017 610  05/25/2017 690  10/15/2016 600    Review of Systems  Constitutional: Negative for appetite change, chills, fever and unexpected weight change.  Respiratory: Negative for cough and shortness of breath.   Cardiovascular: Negative for chest pain and leg swelling.  Gastrointestinal: Negative for constipation and diarrhea.  Genitourinary: Negative for difficulty urinating.  Neurological: Negative for headaches.  Psychiatric/Behavioral: Negative for dysphoric mood and sleep disturbance.       Objective:   Physical Exam  Constitutional: He is oriented to person, place, and time. He appears well-developed and well-nourished.  HENT:  Mouth/Throat: No oropharyngeal exudate.  Eyes: EOM are normal. Pupils are equal, round, and reactive to light.  Neck: Neck supple.  Cardiovascular: Normal rate, regular rhythm and normal heart sounds.  Pulmonary/Chest: Effort normal and breath sounds normal.  Abdominal: Soft. Bowel sounds are normal. There is no tenderness. There is no rebound.  Musculoskeletal: He exhibits no edema.  Lymphadenopathy:    He has no cervical adenopathy.    Neurological: He is alert and oriented to person, place, and time.  Psychiatric: He has a normal mood and affect.      Assessment & Plan:

## 2017-08-31 NOTE — Assessment & Plan Note (Signed)
Doing very well Will decrease his 3TC to 130m/daily.  Gets flu shot today meing vax today Offered/refused condoms. Now single.  rtc in 6 months

## 2017-09-01 ENCOUNTER — Telehealth: Payer: Self-pay

## 2017-09-01 NOTE — Telephone Encounter (Signed)
Called and spoke with patient. Advised of creatinine level at 3.85 which is consistent with kidney disease. Advised of potassium being normal and no medication changes are being made at this time. Will fax labs to [facility]. Thanks!

## 2017-09-01 NOTE — Telephone Encounter (Signed)
-----   Message from [name], [place] sent at 09/01/2017  1:21 PM EST ----- Regarding: lab results Please inform patient that creatinine level is 3.85, which is consistent with kidney disease. Potassium is within a normal range. No medication changes warranted at this time.  Let him know that we will fax labs to [facility].   Thanks

## 2017-09-02 ENCOUNTER — Ambulatory Visit: Payer: Self-pay | Admitting: Family Medicine

## 2017-09-02 ENCOUNTER — Other Ambulatory Visit: Payer: Self-pay | Admitting: Infectious Diseases

## 2017-09-02 DIAGNOSIS — B20 Human immunodeficiency virus [HIV] disease: Secondary | ICD-10-CM

## 2017-09-04 ENCOUNTER — Encounter: Payer: Self-pay | Admitting: Physician Assistant

## 2017-09-04 ENCOUNTER — Other Ambulatory Visit: Payer: Self-pay

## 2017-09-04 ENCOUNTER — Other Ambulatory Visit: Payer: Self-pay | Admitting: *Deleted

## 2017-09-04 ENCOUNTER — Encounter: Payer: Self-pay | Admitting: Family Medicine

## 2017-09-04 DIAGNOSIS — M109 Gout, unspecified: Secondary | ICD-10-CM

## 2017-09-04 DIAGNOSIS — B20 Human immunodeficiency virus [HIV] disease: Secondary | ICD-10-CM

## 2017-09-04 MED ORDER — ALLOPURINOL 100 MG PO TABS: 100.0000 mg | ORAL_TABLET | Freq: Two times a day (BID) | ORAL | 5 refills | Status: DC

## 2017-09-04 MED ORDER — FAMOTIDINE 20 MG PO TABS: 20.0000 mg | ORAL_TABLET | Freq: Two times a day (BID) | ORAL | 9 refills | Status: DC

## 2017-09-05 ENCOUNTER — Other Ambulatory Visit: Payer: Self-pay | Admitting: Infectious Diseases

## 2017-09-05 DIAGNOSIS — B20 Human immunodeficiency virus [HIV] disease: Secondary | ICD-10-CM

## 2017-09-10 ENCOUNTER — Other Ambulatory Visit: Payer: Self-pay | Admitting: Family Medicine

## 2017-09-10 ENCOUNTER — Other Ambulatory Visit: Payer: Self-pay | Admitting: Physician Assistant

## 2017-09-10 DIAGNOSIS — G63 Polyneuropathy in diseases classified elsewhere: Principal | ICD-10-CM

## 2017-09-10 DIAGNOSIS — B20 Human immunodeficiency virus [HIV] disease: Secondary | ICD-10-CM

## 2017-09-12 ENCOUNTER — Other Ambulatory Visit: Payer: Self-pay | Admitting: Infectious Diseases

## 2017-09-12 DIAGNOSIS — B20 Human immunodeficiency virus [HIV] disease: Secondary | ICD-10-CM

## 2017-10-04 ENCOUNTER — Encounter: Payer: Self-pay | Admitting: Family Medicine

## 2017-10-05 ENCOUNTER — Other Ambulatory Visit: Payer: Self-pay | Admitting: Family Medicine

## 2017-10-05 DIAGNOSIS — B20 Human immunodeficiency virus [HIV] disease: Secondary | ICD-10-CM

## 2017-10-05 DIAGNOSIS — G63 Polyneuropathy in diseases classified elsewhere: Principal | ICD-10-CM

## 2017-10-09 ENCOUNTER — Encounter: Payer: Self-pay | Admitting: Family Medicine

## 2017-10-09 ENCOUNTER — Ambulatory Visit (INDEPENDENT_AMBULATORY_CARE_PROVIDER_SITE_OTHER): Payer: Medicaid Other | Admitting: Family Medicine

## 2017-10-09 VITALS — BP 134/92 | HR 70 | Temp 98.3°F | Resp 16 | Ht 70.0 in | Wt 221.0 lb

## 2017-10-09 DIAGNOSIS — E039 Hypothyroidism, unspecified: Secondary | ICD-10-CM | POA: Diagnosis not present

## 2017-10-09 DIAGNOSIS — R5383 Other fatigue: Secondary | ICD-10-CM | POA: Diagnosis not present

## 2017-10-09 DIAGNOSIS — R6889 Other general symptoms and signs: Secondary | ICD-10-CM

## 2017-10-09 MED ORDER — LEVOTHYROXINE SODIUM 25 MCG PO TABS: 25.0000 ug | ORAL_TABLET | Freq: Every day | ORAL | 5 refills | Status: DC

## 2017-10-09 NOTE — Patient Instructions (Addendum)
Will start a trial of Levothyroxine 25 mcg 30 minutes prior to breakfast.  Recommend an iron rich diet for anemia (green leafy veggies, protein, and organ meats.   Hypothyroidism Hypothyroidism is a disorder of the thyroid. The thyroid is a large gland that is located in the lower front of the neck. The thyroid releases hormones that control how the body works. With hypothyroidism, the thyroid does not make enough of these hormones. What are the causes? Causes of hypothyroidism may include:  Viral infections.  Pregnancy.  Your own defense system (immune system) attacking your thyroid.  Certain medicines.  Birth defects.  Past radiation treatments to your head or neck.  Past treatment with radioactive iodine.  Past surgical removal of part or all of your thyroid.  Problems with the gland that is located in the center of your brain (pituitary).  What are the signs or symptoms? Signs and symptoms of hypothyroidism may include:  Feeling as though you have no energy (lethargy).  Inability to tolerate cold.  Weight gain that is not explained by a change in diet or exercise habits.  Dry skin.  Coarse hair.  Menstrual irregularity.  Slowing of thought processes.  Constipation.  Sadness or depression.  How is this diagnosed? Your health care provider may diagnose hypothyroidism with blood tests and ultrasound tests. How is this treated? Hypothyroidism is treated with medicine that replaces the hormones that your body does not make. After you begin treatment, it may take several weeks for symptoms to go away. Follow these instructions at home:  Take medicines only as directed by your health care provider.  If you start taking any new medicines, tell your health care provider.  Keep all follow-up visits as directed by your health care provider. This is important. As your condition improves, your dosage needs may change. You will need to have blood tests regularly so that  your health care provider can watch your condition. Contact a health care provider if:  Your symptoms do not get better with treatment.  You are taking thyroid replacement medicine and: ? You sweat excessively. ? You have tremors. ? You feel anxious. ? You lose weight rapidly. ? You cannot tolerate heat. ? You have emotional swings. ? You have diarrhea. ? You feel weak. Get help right away if:  You develop chest pain.  You develop an irregular heartbeat.  You develop a rapid heartbeat. This information is not intended to replace advice given to you by your health care provider. Make sure you discuss any questions you have with your health care provider. Document Released: 10/13/2005 Document Revised: 03/20/2016 Document Reviewed: 02/28/2014 Elsevier Interactive Patient Education  2017 [employer]. Levothyroxine tablets What is this medicine? LEVOTHYROXINE (lee voe thye ROX een) is a thyroid hormone. This medicine can improve symptoms of thyroid deficiency such as slow speech, lack of energy, weight gain, hair loss, dry skin, and feeling cold. It also helps to treat goiter (an enlarged thyroid gland). It is also used to treat some kinds of thyroid cancer along with surgery and other medicines. This medicine may be used for other purposes; ask your health care provider or pharmacist if you have questions. COMMON BRAND NAME(S): Estre, Levo-T, Levothroid, Levoxyl, Synthroid, Thyro-Tabs, Unithroid What should I tell my health care provider before I take this medicine? They need to know if you have any of these conditions: -angina -blood clotting problems -diabetes -dieting or on a weight loss program -fertility problems -heart disease -high levels of thyroid hormone -pituitary  gland problem -previous heart attack -an unusual or allergic reaction to levothyroxine, thyroid hormones, other medicines, foods, dyes, or preservatives -pregnant or trying to get  pregnant -breast-feeding How should I use this medicine? Take this medicine by mouth with plenty of water. It is best to take on an empty stomach, at least 30 minutes before or 2 hours after food. Follow the directions on the prescription label. Take at the same time each day. Do not take your medicine more often than directed. Contact your pediatrician regarding the use of this medicine in children. While this drug may be prescribed for children and infants as young as a few days of age for selected conditions, precautions do apply. For infants, you may crush the tablet and place in a small amount of (5-10 ml or 1 to 2 teaspoonfuls) of water, breast milk, or non-soy based infant formula. Do not mix with soy-based infant formula. Give as directed. Overdosage: If you think you have taken too much of this medicine contact a poison control center or emergency room at once. NOTE: This medicine is only for you. Do not share this medicine with others. What if I miss a dose? If you miss a dose, take it as soon as you can. If it is almost time for your next dose, take only that dose. Do not take double or extra doses. What may interact with this medicine? -amiodarone -antacids -anti-thyroid medicines -calcium supplements -carbamazepine -cholestyramine -colestipol -digoxin -male hormones, including contraceptive or birth control pills -iron supplements -ketamine -liquid nutrition products like Ensure -medicines for colds and breathing difficulties -medicines for diabetes -medicines for mental depression -medicines or herbals used to decrease weight or appetite -phenobarbital or other barbiturate medications -phenytoin -prednisone or other corticosteroids -rifabutin -rifampin -soy isoflavones -sucralfate -theophylline -warfarin This list may not describe all possible interactions. Give your health care provider a list of all the medicines, herbs, non-prescription drugs, or dietary  supplements you use. Also tell them if you smoke, drink alcohol, or use illegal drugs. Some items may interact with your medicine. What should I watch for while using this medicine? Be sure to take this medicine with plenty of fluids. Some tablets may cause choking, gagging, or difficulty swallowing from the tablet getting stuck in your throat. Most of these problems disappear if the medicine is taken with the right amount of water or other fluids. Do not switch brands of this medicine unless your health care professional agrees with the change. Ask questions if you are uncertain. You will need regular exams and occasional blood tests to check the response to treatment. If you are receiving this medicine for an underactive thyroid, it may be several weeks before you notice an improvement. Check with your doctor or health care professional if your symptoms do not improve. It may be necessary for you to take this medicine for the rest of your life. Do not stop using this medicine unless your doctor or health care professional advises you to. This medicine can affect blood sugar levels. If you have diabetes, check your blood sugar as directed. You may lose some of your hair when you first start treatment. With time, this usually corrects itself. If you are going to have surgery, tell your doctor or health care professional that you are taking this medicine. What side effects may I notice from receiving this medicine? Side effects that you should report to your doctor or health care professional as soon as possible: -allergic reactions like skin rash, itching or hives,  swelling of the face, lips, or tongue -chest pain -excessive sweating or intolerance to heat -fast or irregular heartbeat -nervousness -skin rash or hives -swelling of ankles, feet, or legs -tremors Side effects that usually do not require medical attention (report to your doctor or health care professional if they continue or are  bothersome): -changes in appetite -changes in menstrual periods -diarrhea -hair loss -headache -trouble sleeping -weight loss This list may not describe all possible side effects. Call your doctor for medical advice about side effects. You may report side effects to FDA at 1-800-FDA-1088. Where should I keep my medicine? Keep out of the reach of children. Store at room temperature between 15 and 30 degrees C (59 and 86 degrees F). Protect from light and moisture. Keep container tightly closed. Throw away any unused medicine after the expiration date. NOTE: This sheet is a summary. It may not cover all possible information. If you have questions about this medicine, talk to your doctor, pharmacist, or health care provider.  2018 Elsevier/Gold Standard (2009-01-19 14:28:07) Anemia, Nonspecific Anemia is a condition in which the concentration of red blood cells or hemoglobin in the blood is below normal. Hemoglobin is a substance in red blood cells that carries oxygen to the tissues of the body. Anemia results in not enough oxygen reaching these tissues. What are the causes? Common causes of anemia include:  Excessive bleeding. Bleeding may be internal or external. This includes excessive bleeding from periods (in women) or from the intestine.  Poor nutrition.  Chronic kidney, thyroid, and liver disease.  Bone marrow disorders that decrease red blood cell production.  Cancer and treatments for cancer.  HIV, AIDS, and their treatments.  Spleen problems that increase red blood cell destruction.  Blood disorders.  Excess destruction of red blood cells due to infection, medicines, and autoimmune disorders.  What are the signs or symptoms?  Minor weakness.  Dizziness.  Headache.  Palpitations.  Shortness of breath, especially with exercise.  Paleness.  Cold sensitivity.  Indigestion.  Nausea.  Difficulty sleeping.  Difficulty concentrating. Symptoms may occur  suddenly or they may develop slowly. How is this diagnosed? Additional blood tests are often needed. These help your health care provider determine the best treatment. Your health care provider will check your stool for blood and look for other causes of blood loss. How is this treated? Treatment varies depending on the cause of the anemia. Treatment can include:  Supplements of iron, vitamin N82, or folic acid.  Hormone medicines.  A blood transfusion. This may be needed if blood loss is severe.  Hospitalization. This may be needed if there is significant continual blood loss.  Dietary changes.  Spleen removal.  Follow these instructions at home: Keep all follow-up appointments. It often takes many weeks to correct anemia, and having your health care provider check on your condition and your response to treatment is very important. Get help right away if:  You develop extreme weakness, shortness of breath, or chest pain.  You become dizzy or have trouble concentrating.  You develop heavy vaginal bleeding.  You develop a rash.  You have bloody or black, tarry stools.  You faint.  You vomit up blood.  You vomit repeatedly.  You have abdominal pain.  You have a fever or persistent symptoms for more than 2-3 days.  You have a fever and your symptoms suddenly get worse.  You are dehydrated. This information is not intended to replace advice given to you by your health care provider.  Make sure you discuss any questions you have with your health care provider. Document Released: 11/20/2004 Document Revised: 03/26/2016 Document Reviewed: 04/08/2013 Elsevier Interactive Patient Education  2017 [employer].

## 2017-10-09 NOTE — Progress Notes (Signed)
Subjective:    Patient ID: Albert Eaton, male    DOB: 07-05-1966, 51 y.o.   MRN: [id number]  HPI  Jerami Tammen, a 51 year old male with a history of HIV, malignant hypertension, and stage IV chronic kidney disease presents complaining of intolerance to cold over the past several weeks.  Patient states that he has been told he was anemic in the past, but is thinking the anemia has worsened.  He states that he maintains home heat on 80 degrees and typically sits with a jacket and blankets.  He says that his family members cannot tolerate the temperature of his home, but is comfortable to him.  Patient endorses fatigue and intolerance to cold.  He denies constipation, heart palpitations, changes to skin and hair, dysuria, rectal bleeding or excessive sweating.  Patient has a family history of hypothyroidism in his mother. Past Medical History:  Diagnosis Date  . Chronic diastolic CHF (congestive heart failure) ([place]) 01/06/2017   Echo 7/16 [facility] in [place], [name]) Mild AI, mild LAE, mild concentric LVH, EF 55, normal wall motion, mild to moderate MR, mild PI, RVSP 55 // Echo 10/09/16 ([facility]):  Moderate LVH, grade 2 diastolic dysfunction, mild MR, moderate LAE   . CKD (chronic kidney disease)   . Gout   . History of nuclear stress test    a. Nuc study 7/16: no scar or ischemia, EF 42 // b. Nuc study 12/17: EF 48, ?small apical ischemia, Low Risk  . HIV (human immunodeficiency virus infection) ([place])   . HLD (hyperlipidemia)   . Hypertensive heart disease with CHF (congestive heart failure) ([place]) 10/09/2016  . OSA (obstructive sleep apnea)    Immunization History  Administered Date(s) Administered  . Influenza,inj,Quad PF,6+ Mos 10/10/2016, 08/31/2017  . Meningococcal Mcv4o 10/15/2016, 08/31/2017  . Pneumococcal Polysaccharide-23 10/15/2016  . Tdap 01/27/2017   Social History   Socioeconomic History  . Marital status: Single    Spouse name: Not on file  . Number of  children: Not on file  . Years of education: Not on file  . Highest education level: Not on file  Social Needs  . Financial resource strain: Not on file  . Food insecurity - worry: Not on file  . Food insecurity - inability: Not on file  . Transportation needs - medical: Not on file  . Transportation needs - non-medical: Not on file  Occupational History  . Not on file  Tobacco Use  . Smoking status: Former Smoker    Last attempt to quit: 2000    Years since quitting: 18.9  . Smokeless tobacco: Former Systems developer    Quit date: 09/10/1999  Substance and Sexual Activity  . Alcohol use: Yes    Alcohol/week: 8.4 oz    Types: 14 Shots of liquor per week    Comment: Light use  . Drug use: No  . Sexual activity: No    Partners: Male    Birth control/protection: Condom    Comment: declined condoms  Other Topics Concern  . Not on file  Social History Narrative  . Not on file   Allergies  Allergen Reactions  . Lisinopril Cough     Review of Systems  Constitutional: Positive for fatigue.  HENT: Negative.   Respiratory: Negative.   Cardiovascular: Negative.   Gastrointestinal: Negative.   Endocrine: Positive for cold intolerance.  Genitourinary: Negative.   Musculoskeletal: Negative.   Skin: Negative.   Allergic/Immunologic: Positive for immunocompromised state (HIV positive).  Neurological: Negative.   Hematological:  Negative.   Psychiatric/Behavioral: Negative.        Objective:   Physical Exam  Constitutional: He is oriented to person, place, and time. He appears well-developed and well-nourished.  HENT:  Head: Normocephalic and atraumatic.  Right Ear: External ear normal.  Left Ear: External ear normal.  Nose: Nose normal.  Mouth/Throat: Oropharynx is clear and moist.  Eyes: Conjunctivae and EOM are normal. Pupils are equal, round, and reactive to light.  Neck: Normal range of motion. Neck supple.  Cardiovascular: Normal rate, regular rhythm, normal heart sounds and  intact distal pulses.  Pulmonary/Chest: Effort normal and breath sounds normal.  Abdominal: Soft. Bowel sounds are normal.  Musculoskeletal: Normal range of motion.  Neurological: He is alert and oriented to person, place, and time. He has normal reflexes.  Skin: Skin is warm and dry.  Psychiatric: He has a normal mood and affect. His behavior is normal. Judgment and thought content normal.     BP (!) 134/92 (BP Location: Right Arm, Patient Position: Sitting, Cuff Size: Normal) Comment: manually  Pulse 70   Temp 98.3 F (36.8 C) (Oral)   Resp 16   Ht 5' 10"  (1.778 m)   Wt 221 lb (100.2 kg)   SpO2 100%   BMI 31.71 kg/m   Assessment & Plan:  Acquired hypothyroidism Patient was evaluated by Dr. Hollie Salk, nephrology on 10/08/2017.  She obtained a TSH.  Requested results TSH is 5.05.  We will start a trial of levothyroxine 25 mcg daily prior to breakfast.  We will add on free T3 and T4.  Will recheck TSH in 6 weeks - levothyroxine (SYNTHROID, LEVOTHROID) 25 MCG tablet; Take 1 tablet (25 mcg total) by mouth daily before breakfast.  Dispense: 30 tablet; Refill: 5  Cold intolerance Reviewed TSH.  Refer to #1  Other fatigue  Refer to #1   [name]  MSN, FNP-C [facility] [address] [place], [place] [address] [phone]

## 2017-10-12 ENCOUNTER — Encounter: Payer: Self-pay | Admitting: Family Medicine

## 2017-10-12 ENCOUNTER — Encounter: Payer: Self-pay | Admitting: Internal Medicine

## 2017-10-12 ENCOUNTER — Other Ambulatory Visit: Payer: Self-pay | Admitting: Family Medicine

## 2017-10-12 DIAGNOSIS — Z1211 Encounter for screening for malignant neoplasm of colon: Secondary | ICD-10-CM

## 2017-10-13 DIAGNOSIS — Z8739 Personal history of other diseases of the musculoskeletal system and connective tissue: Secondary | ICD-10-CM | POA: Insufficient documentation

## 2017-10-13 DIAGNOSIS — I5030 Unspecified diastolic (congestive) heart failure: Secondary | ICD-10-CM | POA: Insufficient documentation

## 2017-10-13 DIAGNOSIS — E039 Hypothyroidism, unspecified: Secondary | ICD-10-CM | POA: Insufficient documentation

## 2017-10-13 DIAGNOSIS — E782 Mixed hyperlipidemia: Secondary | ICD-10-CM | POA: Insufficient documentation

## 2017-10-13 DIAGNOSIS — Z9049 Acquired absence of other specified parts of digestive tract: Secondary | ICD-10-CM | POA: Insufficient documentation

## 2017-10-30 ENCOUNTER — Other Ambulatory Visit: Payer: Self-pay | Admitting: Family Medicine

## 2017-10-30 DIAGNOSIS — G63 Polyneuropathy in diseases classified elsewhere: Principal | ICD-10-CM

## 2017-10-30 DIAGNOSIS — B20 Human immunodeficiency virus [HIV] disease: Secondary | ICD-10-CM

## 2017-11-02 ENCOUNTER — Ambulatory Visit: Payer: Self-pay | Admitting: Family Medicine

## 2017-11-19 ENCOUNTER — Other Ambulatory Visit (INDEPENDENT_AMBULATORY_CARE_PROVIDER_SITE_OTHER): Payer: Medicaid Other

## 2017-11-19 ENCOUNTER — Other Ambulatory Visit: Payer: Self-pay | Admitting: Family Medicine

## 2017-11-19 ENCOUNTER — Other Ambulatory Visit: Payer: Self-pay

## 2017-11-19 ENCOUNTER — Ambulatory Visit (HOSPITAL_COMMUNITY): Payer: Medicaid Other | Attending: Cardiology

## 2017-11-19 DIAGNOSIS — I371 Nonrheumatic pulmonary valve insufficiency: Secondary | ICD-10-CM | POA: Diagnosis not present

## 2017-11-19 DIAGNOSIS — G4733 Obstructive sleep apnea (adult) (pediatric): Secondary | ICD-10-CM | POA: Insufficient documentation

## 2017-11-19 DIAGNOSIS — I110 Hypertensive heart disease with heart failure: Secondary | ICD-10-CM

## 2017-11-19 DIAGNOSIS — E039 Hypothyroidism, unspecified: Secondary | ICD-10-CM | POA: Insufficient documentation

## 2017-11-19 DIAGNOSIS — I5032 Chronic diastolic (congestive) heart failure: Secondary | ICD-10-CM | POA: Insufficient documentation

## 2017-11-19 DIAGNOSIS — I130 Hypertensive heart and chronic kidney disease with heart failure and stage 1 through stage 4 chronic kidney disease, or unspecified chronic kidney disease: Secondary | ICD-10-CM | POA: Diagnosis not present

## 2017-11-19 DIAGNOSIS — N189 Chronic kidney disease, unspecified: Secondary | ICD-10-CM | POA: Diagnosis not present

## 2017-11-19 DIAGNOSIS — I081 Rheumatic disorders of both mitral and tricuspid valves: Secondary | ICD-10-CM | POA: Diagnosis not present

## 2017-11-19 DIAGNOSIS — E785 Hyperlipidemia, unspecified: Secondary | ICD-10-CM | POA: Diagnosis not present

## 2017-11-19 DIAGNOSIS — B20 Human immunodeficiency virus [HIV] disease: Secondary | ICD-10-CM | POA: Insufficient documentation

## 2017-11-20 ENCOUNTER — Other Ambulatory Visit: Payer: Medicaid Other

## 2017-11-20 LAB — BASIC METABOLIC PANEL
BUN / CREAT RATIO: 9 (ref 9–20)
BUN: 34 mg/dL — AB (ref 6–24)
CALCIUM: 10 mg/dL (ref 8.7–10.2)
CO2: 23 mmol/L (ref 20–29)
CREATININE: 3.62 mg/dL — AB (ref 0.76–1.27)
Chloride: 100 mmol/L (ref 96–106)
GFR calc Af Amer: 21 mL/min/{1.73_m2} — ABNORMAL LOW (ref >59)
GFR calc non Af Amer: 18 mL/min/{1.73_m2} — ABNORMAL LOW (ref >59)
GLUCOSE: 105 mg/dL — AB (ref 65–99)
Potassium: 4.4 mmol/L (ref 3.5–5.2)
Sodium: 142 mmol/L (ref 134–144)

## 2017-11-20 LAB — THYROID PANEL WITH TSH
FREE THYROXINE INDEX: 1.7 (ref 1.2–4.9)
T3 UPTAKE RATIO: 25 % (ref 24–39)
T4, Total: 6.7 ug/dL (ref 4.5–12.0)
TSH: 3.36 u[IU]/mL (ref 0.450–4.500)

## 2017-11-22 ENCOUNTER — Encounter: Payer: Self-pay | Admitting: Family Medicine

## 2017-11-24 ENCOUNTER — Ambulatory Visit (AMBULATORY_SURGERY_CENTER): Payer: Self-pay

## 2017-11-24 VITALS — Ht 70.0 in | Wt 218.4 lb

## 2017-11-24 DIAGNOSIS — Z1211 Encounter for screening for malignant neoplasm of colon: Secondary | ICD-10-CM

## 2017-11-24 MED ORDER — NA SULFATE-K SULFATE-MG SULF 17.5-3.13-1.6 GM/177ML PO SOLN: 1.0000 | Freq: Once | ORAL | 0 refills | Status: AC

## 2017-11-24 NOTE — Progress Notes (Signed)
Per pt, no allergies to soy or egg products.Pt not taking any weight loss meds or using  O2 at home.  Pt refused emmi video. 

## 2017-12-01 ENCOUNTER — Emergency Department (HOSPITAL_COMMUNITY)
Admission: EM | Admit: 2017-12-01 | Discharge: 2017-12-02 | Disposition: A | Discharge status: Home / Self Care | Payer: Medicaid Other | Attending: Emergency Medicine | Admitting: Emergency Medicine

## 2017-12-01 ENCOUNTER — Encounter (HOSPITAL_COMMUNITY): Payer: Self-pay

## 2017-12-01 ENCOUNTER — Emergency Department (HOSPITAL_COMMUNITY): Payer: Medicaid Other

## 2017-12-01 ENCOUNTER — Other Ambulatory Visit: Payer: Self-pay

## 2017-12-01 DIAGNOSIS — I130 Hypertensive heart and chronic kidney disease with heart failure and stage 1 through stage 4 chronic kidney disease, or unspecified chronic kidney disease: Secondary | ICD-10-CM | POA: Insufficient documentation

## 2017-12-01 DIAGNOSIS — R109 Unspecified abdominal pain: Secondary | ICD-10-CM | POA: Diagnosis present

## 2017-12-01 DIAGNOSIS — R1084 Generalized abdominal pain: Secondary | ICD-10-CM

## 2017-12-01 DIAGNOSIS — N184 Chronic kidney disease, stage 4 (severe): Secondary | ICD-10-CM | POA: Insufficient documentation

## 2017-12-01 DIAGNOSIS — Z87891 Personal history of nicotine dependence: Secondary | ICD-10-CM | POA: Insufficient documentation

## 2017-12-01 DIAGNOSIS — I5032 Chronic diastolic (congestive) heart failure: Secondary | ICD-10-CM | POA: Diagnosis not present

## 2017-12-01 DIAGNOSIS — Z79899 Other long term (current) drug therapy: Secondary | ICD-10-CM | POA: Diagnosis not present

## 2017-12-01 DIAGNOSIS — K529 Noninfective gastroenteritis and colitis, unspecified: Secondary | ICD-10-CM | POA: Diagnosis not present

## 2017-12-01 DIAGNOSIS — E039 Hypothyroidism, unspecified: Secondary | ICD-10-CM | POA: Insufficient documentation

## 2017-12-01 LAB — COMPREHENSIVE METABOLIC PANEL
ALBUMIN: 4.1 g/dL (ref 3.5–5.0)
ALT: 13 U/L — AB (ref 17–63)
AST: 17 U/L (ref 15–41)
Alkaline Phosphatase: 80 U/L (ref 38–126)
Anion gap: 15 (ref 5–15)
BILIRUBIN TOTAL: 0.7 mg/dL (ref 0.3–1.2)
BUN: 28 mg/dL — AB (ref 6–20)
CHLORIDE: 104 mmol/L (ref 101–111)
CO2: 22 mmol/L (ref 22–32)
CREATININE: 3.24 mg/dL — AB (ref 0.61–1.24)
Calcium: 10.1 mg/dL (ref 8.9–10.3)
GFR calc Af Amer: 24 mL/min — ABNORMAL LOW (ref >60)
GFR, EST NON AFRICAN AMERICAN: 21 mL/min — AB (ref >60)
GLUCOSE: 116 mg/dL — AB (ref 65–99)
Potassium: 3.2 mmol/L — ABNORMAL LOW (ref 3.5–5.1)
Sodium: 141 mmol/L (ref 135–145)
TOTAL PROTEIN: 7.8 g/dL (ref 6.5–8.1)

## 2017-12-01 LAB — CBC
HEMATOCRIT: 40.5 % (ref 39.0–52.0)
Hemoglobin: 14.3 g/dL (ref 13.0–17.0)
MCH: 31 pg (ref 26.0–34.0)
MCHC: 35.3 g/dL (ref 30.0–36.0)
MCV: 87.9 fL (ref 78.0–100.0)
PLATELETS: 328 10*3/uL (ref 150–400)
RBC: 4.61 MIL/uL (ref 4.22–5.81)
RDW: 13.2 % (ref 11.5–15.5)
WBC: 9.4 10*3/uL (ref 4.0–10.5)

## 2017-12-01 LAB — URINALYSIS, ROUTINE W REFLEX MICROSCOPIC
BILIRUBIN URINE: NEGATIVE
Glucose, UA: NEGATIVE mg/dL
Ketones, ur: NEGATIVE mg/dL
LEUKOCYTES UA: NEGATIVE
Nitrite: NEGATIVE
PH: 5 (ref 5.0–8.0)
Protein, ur: >=300 mg/dL — AB
SPECIFIC GRAVITY, URINE: 1.024 (ref 1.005–1.030)
SQUAMOUS EPITHELIAL / LPF: NONE SEEN

## 2017-12-01 LAB — LIPASE, BLOOD: Lipase: 34 U/L (ref 11–51)

## 2017-12-01 IMAGING — CT CT ABD-PELV W/O CM
2 of 4 series · 15 of 46 positions shown, 17 images · non-contrast
Comparison: CT of the abdomen and pelvis from [DATE], and renal
ultrasound performed [DATE]

CLINICAL DATA: Acute onset of centralized abdominal pain, nausea
and vomiting.

EXAM:
CT ABDOMEN AND PELVIS WITHOUT CONTRAST
TECHNIQUE: Multidetector CT imaging of the abdomen and pelvis was performed
following the standard protocol without IV contrast.

[Series 3: a/p w/o 5mm · axial · non-contrast · 0.74mm/px · z∈[+793,+1233]mm · 12 of 101 slices shown, 14 images]
[im 9/101  soft-tissue]
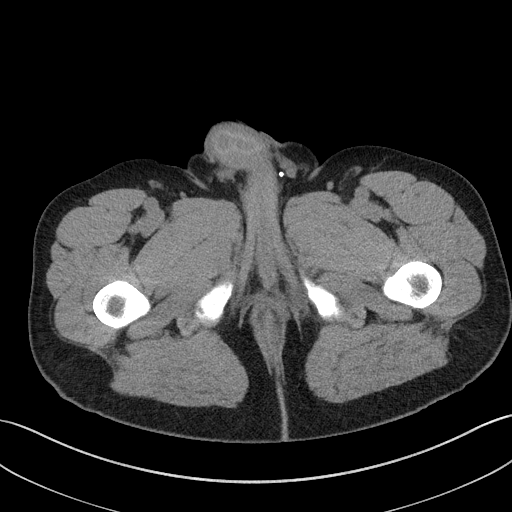
[im 9/101  bone]
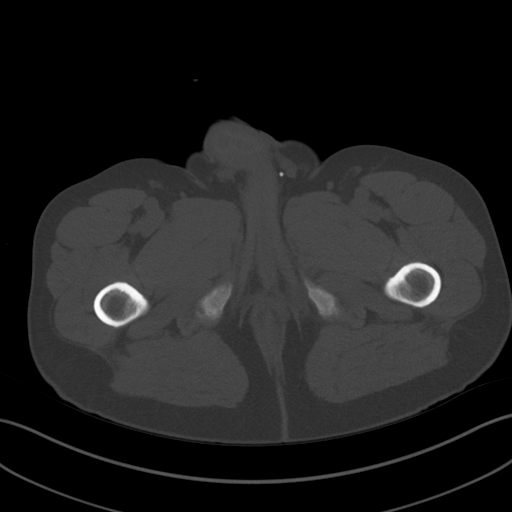
[im 17/101  soft-tissue]
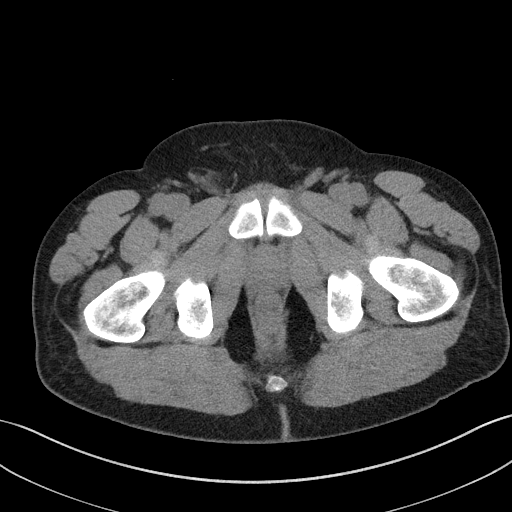
[im 25/101  soft-tissue]
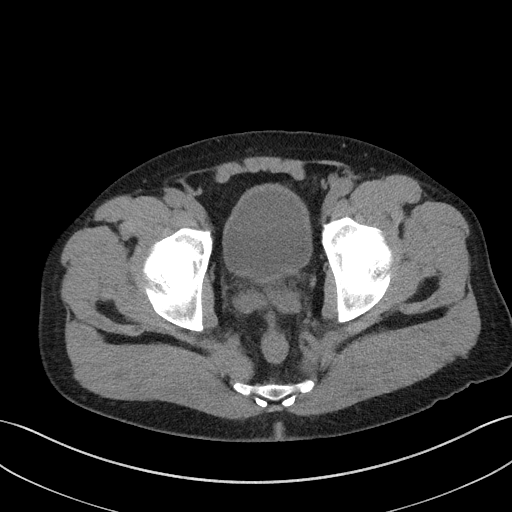
[im 33/101  soft-tissue]
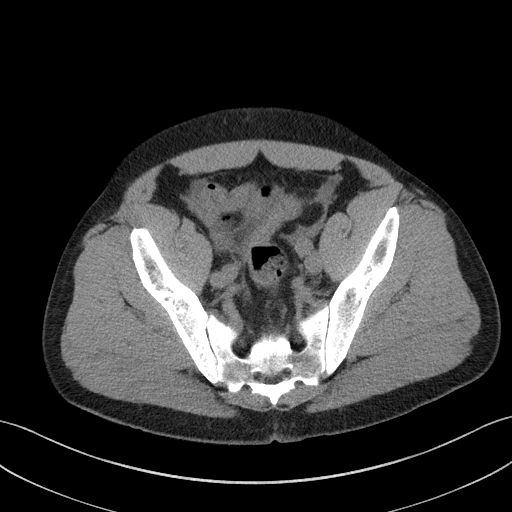
[im 41/101  soft-tissue]
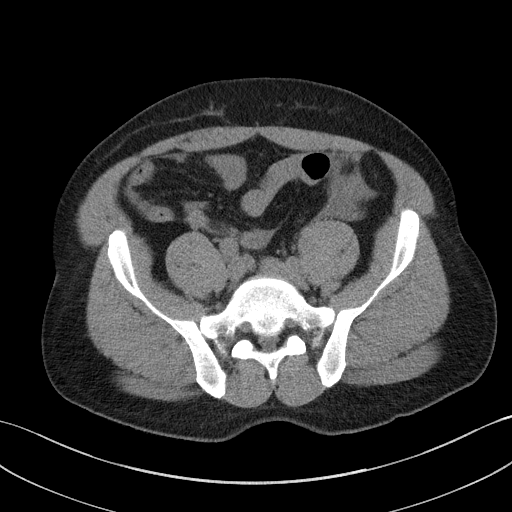
[im 49/101  soft-tissue]
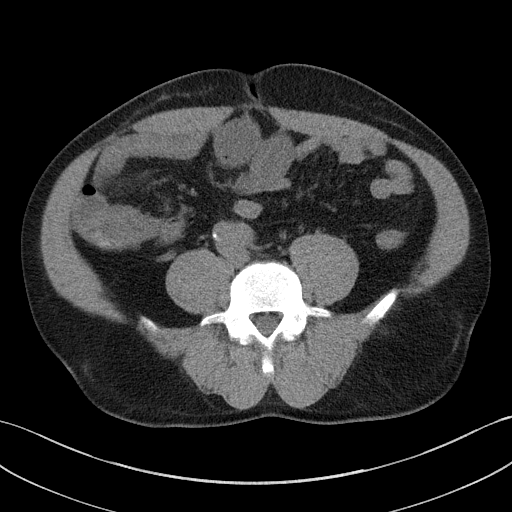
[im 57/101  soft-tissue]
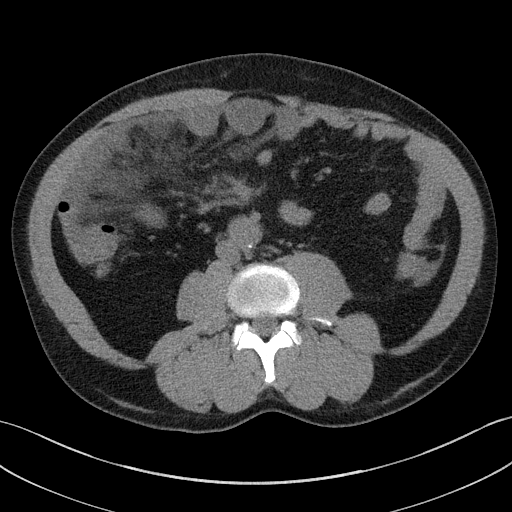
[im 65/101  soft-tissue]
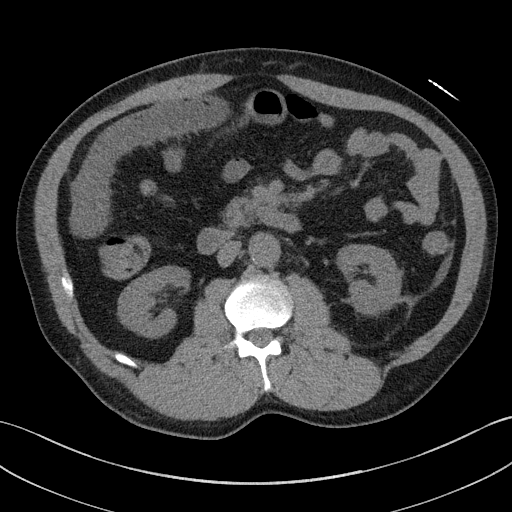
[im 73/101  soft-tissue]
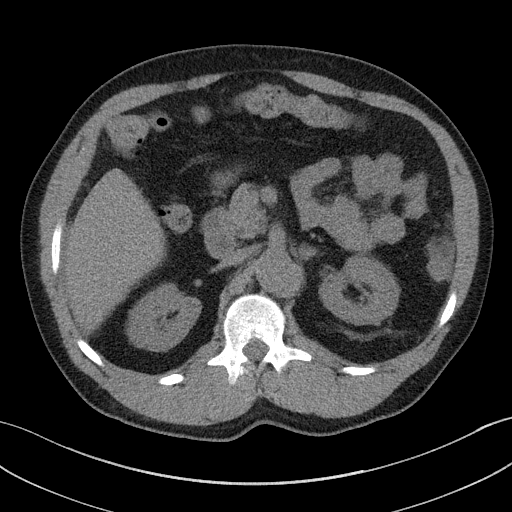
[im 73/101  bone]
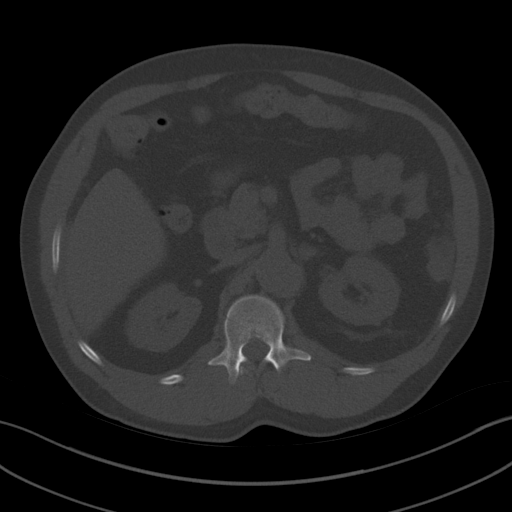
[im 81/101  soft-tissue]
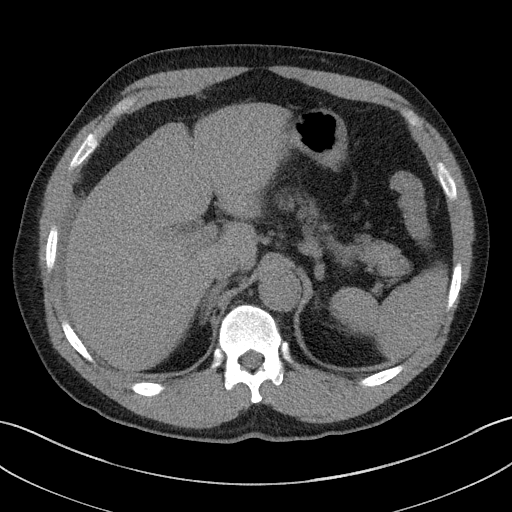
[im 89/101  soft-tissue]
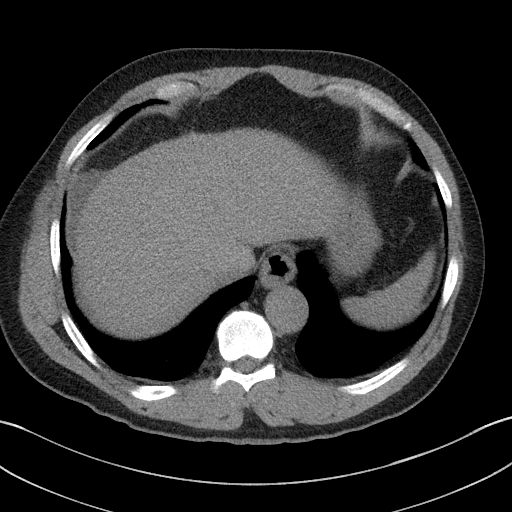
[im 97/101  soft-tissue]
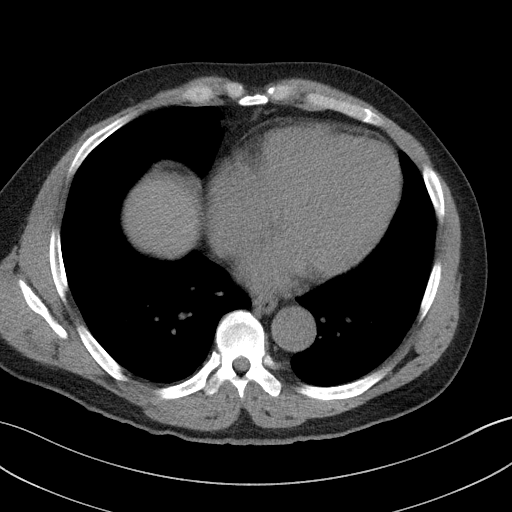

[Series 7: a/p w/o cor · coronal · non-contrast · 0.79mm/px · 3 of 158 slices shown]
[im 53/158  soft-tissue]
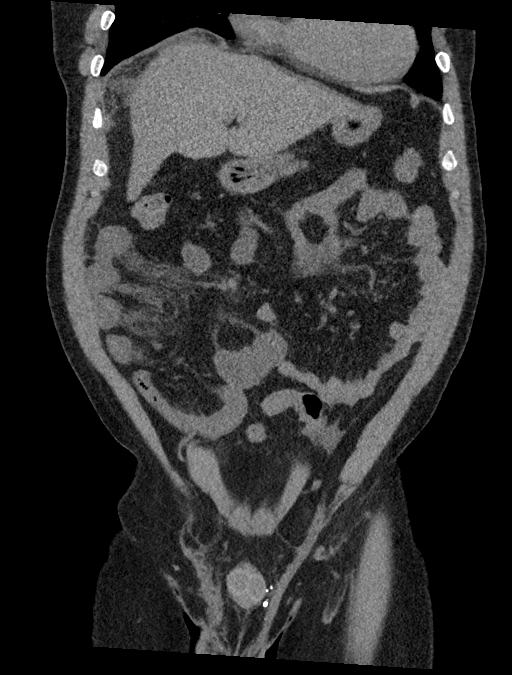
[im 70/158  soft-tissue]
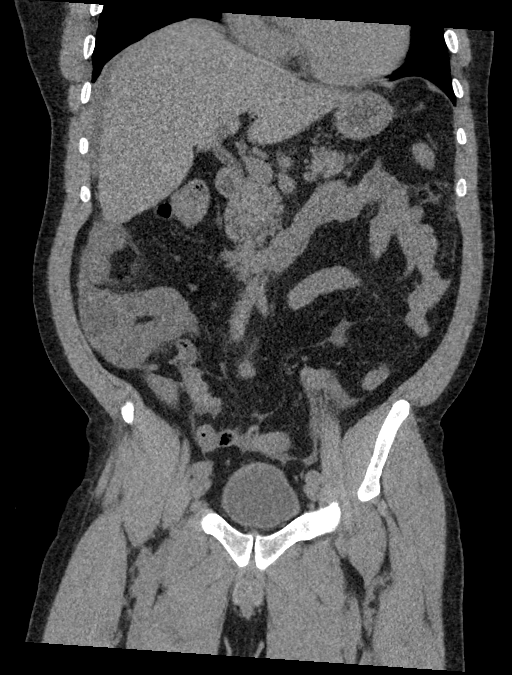
[im 88/158  soft-tissue]
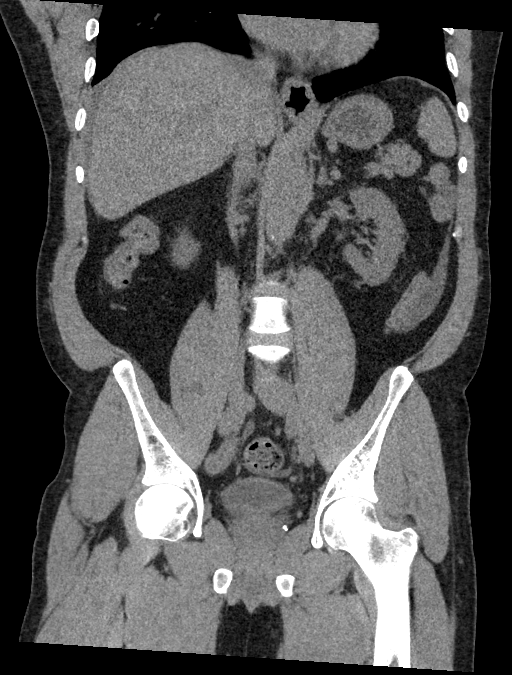

[15 of 46 positions shown; findings below may reference images not displayed]

FINDINGS: Lower chest: The visualized lung bases are grossly clear. The
visualized portions of the mediastinum are unremarkable.

Hepatobiliary: The liver is unremarkable in appearance. The patient
is status post cholecystectomy, with clips noted along the
gallbladder fossa. The common bile duct remains normal in caliber.
Trace ascites is noted tracking about the liver.

Pancreas: The pancreas is within normal limits.

Spleen: The spleen is unremarkable in appearance.

Adrenals/Urinary Tract: The adrenal glands are unremarkable in
appearance.

Left-sided perinephric fluid is noted. A small left renal cyst is
seen. This could reflect underlying pyelonephritis. No renal or
ureteral stones are identified. The right kidney is unremarkable in
appearance.

Stomach/Bowel: The stomach is unremarkable in appearance.

Diffuse mesenteric edema is noted at the right lower quadrant,
adjacent to the distal ileum, with suggestion of ileal wall
thickening. This may reflect an acute infectious or inflammatory
ileitis. Trace free fluid is seen tracking along the paracolic
gutters and within the pelvis.

The appendix is normal in caliber, without evidence of appendicitis.
The colon is unremarkable in appearance.

Vascular/Lymphatic: Scattered calcification is seen along the
abdominal aorta and its branches. The abdominal aorta is otherwise
grossly unremarkable. The inferior vena cava is grossly
unremarkable. No retroperitoneal lymphadenopathy is seen. No pelvic
sidewall lymphadenopathy is identified.

Reproductive: The bladder is mildly distended and grossly
unremarkable. The prostate remains normal in size, with minimal
calcification.

Other: No additional soft tissue abnormalities are seen.

Musculoskeletal: No acute osseous abnormalities are identified. The
visualized musculature is unremarkable in appearance.
IMPRESSION: 1. Diffuse mesenteric edema at the right lower quadrant, adjacent to
the distal ileum, with suggestion of ileal wall thickening. This may
reflect an infectious or inflammatory ileitis. Trace ascites noted
within the abdomen and pelvis.
2. Left-sided perinephric fluid, with small left renal cyst. This
could reflect underlying pyelonephritis, or may extend from the
ascites.

Aortic Atherosclerosis ([RX]-[RX]).

## 2017-12-01 MED ORDER — ONDANSETRON 4 MG PO TBDP
4.0000 mg | ORAL_TABLET | Freq: Once | ORAL | Status: AC | PRN
  Administered 2017-12-01: 4 mg via ORAL
  Filled 2017-12-01: qty 1

## 2017-12-01 MED ORDER — ONDANSETRON HCL 4 MG/2ML IJ SOLN
4.0000 mg | Freq: Once | INTRAMUSCULAR | Status: AC | PRN
  Administered 2017-12-01: 4 mg via INTRAVENOUS
  Filled 2017-12-01: qty 2

## 2017-12-01 MED ORDER — MORPHINE SULFATE (PF) 4 MG/ML IV SOLN
4.0000 mg | Freq: Once | INTRAVENOUS | Status: AC
  Administered 2017-12-01: 4 mg via INTRAVENOUS
  Filled 2017-12-01: qty 1

## 2017-12-01 MED ORDER — HYDROMORPHONE HCL 1 MG/ML IJ SOLN: 1.0000 mg | Freq: Once | INTRAMUSCULAR | Status: DC

## 2017-12-01 NOTE — ED Triage Notes (Signed)
Pt presents with mid-abdominal pain that began yesterday.  +nausea, vomiting; reports last bowel movement was Sunday.  Pt reports h/o colitis with today's symptoms similar.

## 2017-12-01 NOTE — ED Notes (Signed)
Patient transported to CT 

## 2017-12-02 ENCOUNTER — Other Ambulatory Visit: Payer: Self-pay

## 2017-12-02 MED ORDER — PROMETHAZINE HCL 25 MG PO TABS: 25.0000 mg | ORAL_TABLET | Freq: Four times a day (QID) | ORAL | 0 refills | Status: DC | PRN

## 2017-12-02 MED ORDER — HYDROCODONE-ACETAMINOPHEN 5-325 MG PO TABS: 1.0000 | ORAL_TABLET | Freq: Four times a day (QID) | ORAL | 0 refills | Status: DC | PRN

## 2017-12-02 MED ORDER — AMOXICILLIN-POT CLAVULANATE 875-125 MG PO TABS: 1.0000 | ORAL_TABLET | Freq: Two times a day (BID) | ORAL | 0 refills | Status: DC

## 2017-12-02 NOTE — Discharge Instructions (Signed)
Return here as needed.  Increase your fluid intake.  Follow-up with a GI doctor provided.

## 2017-12-02 NOTE — ED Provider Notes (Signed)
[facility] EMERGENCY DEPARTMENT Provider Note   CSN: [id number] Arrival date & time: 12/01/17  1238     History   Chief Complaint Chief Complaint  Patient presents with  . Abdominal Pain    HPI Albert Eaton is a 52 y.o. male.  HPI Patient presents to the emergency department with abdominal discomfort that began yesterday.  The patient states he is also had nausea and vomiting and has not had any bowel movement over the last 2 days.  The patient states that he has had similar issues back in May.  The patient denies chest pain, shortness of breath, headache,blurred vision, neck pain, fever, cough, weakness, numbness, dizziness, anorexia, edema,  diarrhea, rash, back pain, dysuria, hematemesis, bloody stool, near syncope, or syncope. Past Medical History:  Diagnosis Date  . Anemia   . Chronic diastolic CHF (congestive heart failure) ([place]) 01/06/2017   Echo 7/16 [facility] in [place], [name]) Mild AI, mild LAE, mild concentric LVH, EF 55, normal wall motion, mild to moderate MR, mild PI, RVSP 55 // Echo 10/09/16 ([facility]):  Moderate LVH, grade 2 diastolic dysfunction, mild MR, moderate LAE   . CKD (chronic kidney disease)    per pt, waiting on a kidney transplant in near future.  . Gout   . History of nuclear stress test    a. Nuc study 7/16: no scar or ischemia, EF 42 // b. Nuc study 12/17: EF 48, ?small apical ischemia, Low Risk  . HIV (human immunodeficiency virus infection) ([place])   . HLD (hyperlipidemia)   . Hypertension   . Hypertensive heart disease with CHF (congestive heart failure) ([place]) 10/09/2016  . OSA (obstructive sleep apnea)   . Post-operative nausea and vomiting    1 time as a child  . Sleep apnea    uses c-pap    Patient Active Problem List   Diagnosis Date Noted  . Hypothyroidism 11/19/2017  . 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 SOB (shortness of breath) 06/07/2017  . Arthritis, gouty 01/27/2017  . Chronic diastolic CHF  (congestive heart failure) ([place]) 01/06/2017  . OSA (obstructive sleep apnea) 01/06/2017  . Abnormal electrocardiogram (ECG) (EKG) 10/31/2016  . Chest pain 10/09/2016  . HIV disease ([place]) 10/09/2016  . CKD stage 4 secondary to hypertension ([place]) 10/09/2016  . Hypertensive heart disease with CHF (congestive heart failure) ([place]) 10/09/2016    Past Surgical History:  Procedure Laterality Date  . CHOLECYSTECTOMY    . HERNIA REPAIR     As baby  . NOSE SURGERY    . RENAL BIOPSY         Home Medications    Prior to Admission medications   Medication Sig Start Date End Date Taking? Authorizing Provider  abacavir (ZIAGEN) 300 MG tablet TAKE 1 TABLET BY MOUTH DAILY Patient taking differently: TAKE 300 mg TABLET BY MOUTH DAILY 09/03/17  Yes Campbell Riches, MD  allopurinol (ZYLOPRIM) 100 MG tablet Take 1 tablet (100 mg total) 2 (two) times daily by mouth. 09/04/17  Yes [name], FNP  amLODipine (NORVASC) 10 MG tablet Take 1 tablet (10 mg total) by mouth daily. 10/31/16  Yes [name], FNP  carvedilol (COREG) 25 MG tablet Take 1 tablet (25 mg total) by mouth 2 (two) times daily with a meal. 12/09/16  Yes [name], MD  cloNIDine (CATAPRES) 0.1 MG tablet Take 1 tablet (0.1 mg total) by mouth 2 (two) times daily. 01/06/17  Yes Richardson Dopp T, PA-C  famotidine (  PEPCID) 20 MG tablet Take 1 tablet (20 mg total) 2 (two) times daily by mouth. 09/04/17  Yes [name], MD  furosemide (LASIX) 20 MG tablet Take 3 tablets (60 mg total) daily by mouth. Patient taking differently: Take 40 mg by mouth 2 (two) times daily.  09/11/17  Yes Weaver, Scott T, PA-C  gabapentin (NEURONTIN) 300 MG capsule TAKE 1 CAPSULE BY MOUTH THREE TIMES DAILY Patient taking differently: TAKE 300 mg CAPSULE BY MOUTH THREE TIMES DAILY 10/30/17  Yes Scot Jun, FNP  hydrALAZINE (APRESOLINE) 100 MG tablet Take 1 tablet (100 mg total) by mouth 3 (three) times daily. 12/09/16  Yes [name], MD    ISENTRESS 400 MG tablet TAKE 1 TABLET BY MOUTH TWICE DAILY Patient taking differently: TAKE 400 mg TABLET BY MOUTH TWICE DAILY 09/07/17  Yes Campbell Riches, MD  lamivudine (EPIVIR) 100 MG tablet Take 1 tablet (100 mg total) daily by mouth. 08/31/17  Yes Campbell Riches, MD  levocetirizine (XYZAL) 5 MG tablet Take 1 tablet (5 mg total) by mouth every evening. 03/31/17  Yes [name], FNP  levothyroxine (SYNTHROID, LEVOTHROID) 25 MCG tablet Take 1 tablet (25 mcg total) by mouth daily before breakfast. 10/09/17  Yes [name], FNP  ondansetron (ZOFRAN ODT) 4 MG disintegrating tablet 19m ODT q4 hours prn nausea/vomit Patient taking differently: Take 4 mg by mouth as needed for nausea. 461mODT q4 hours prn nausea/vomit 06/01/17  Yes BeMalvin JohnsMD  potassium chloride SA (K-DUR,KLOR-CON) 20 MEQ tablet Take 30 mEq by mouth daily.   Yes [provider]  spironolactone (ALDACTONE) 25 MG tablet Take 25 mg by mouth daily. Take 1/2 pill daily   Yes [provider]  traMADol (ULTRAM) 50 MG tablet Take 1 tablet (50 mg total) by mouth every 6 (six) hours as needed. 06/01/17  Yes Pollina, ChGwenyth AllegraMD  triamcinolone cream (KENALOG) 0.1 % Apply 1 application topically 2 (two) times daily. Patient taking differently: Apply 1 application topically as needed.  07/13/17  Yes HoDorena DewFNP  zinc gluconate 50 MG tablet Take 100 mg by mouth daily.   Yes [provider]  atorvastatin (LIPITOR) 40 MG tablet Take 1 tablet (40 mg total) by mouth daily. Patient not taking: Reported on 11/24/2017 10/31/16   HoDorena DewFNP    Family History Family History  Problem Relation Age of Onset  . Heart failure Mother   . Heart attack Maternal Grandmother 53  . Hypertension Sister   . Hypertension Brother   . Hypertension Sister     Social History Social History   Tobacco Use  . Smoking status: Former Smoker    Last attempt to quit: 2000    Years since  quitting: 19.1  . Smokeless tobacco: Never Used  Substance Use Topics  . Alcohol use: Yes    Alcohol/week: 1.2 oz    Types: 2 Shots of liquor per week    Comment: Light use  . Drug use: No     Allergies   Lisinopril and Ace inhibitors   Review of Systems Review of Systems All other systems negative except as documented in the HPI. All pertinent positives and negatives as reviewed in the HPI.  Physical Exam Updated Vital Signs BP (!) 207/140 (BP Location: Left Arm)   Pulse 79   Temp 99.5 F (37.5 C) (Oral)   Resp 16   Ht 5' 10"  (1.778 m)   Wt 96.6 kg (213 lb)  SpO2 98%   BMI 30.56 kg/m   Physical Exam  Constitutional: He is oriented to person, place, and time. He appears well-developed and well-nourished. No distress.  HENT:  Head: Normocephalic and atraumatic.  Mouth/Throat: Oropharynx is clear and moist.  Eyes: Pupils are equal, round, and reactive to light.  Neck: Normal range of motion. Neck supple.  Cardiovascular: Normal rate, regular rhythm and normal heart sounds. Exam reveals no gallop and no friction rub.  No murmur heard. Pulmonary/Chest: Effort normal and breath sounds normal. No respiratory distress. He has no wheezes.  Abdominal: Soft. Bowel sounds are normal. He exhibits no distension. There is generalized tenderness and tenderness in the periumbilical area.  Neurological: He is alert and oriented to person, place, and time. He exhibits normal muscle tone. Coordination normal.  Skin: Skin is warm and dry. Capillary refill takes less than 2 seconds. No rash noted. No erythema.  Psychiatric: He has a normal mood and affect. His behavior is normal.  Nursing note and vitals reviewed.    ED Treatments / Results  Labs (all labs ordered are listed, but only abnormal results are displayed) Labs Reviewed  COMPREHENSIVE METABOLIC PANEL - Abnormal; Notable for the following components:      Result Value   Potassium 3.2 (*)    Glucose, Bld 116 (*)    BUN  28 (*)    Creatinine, Ser 3.24 (*)    ALT 13 (*)    GFR calc non Af Amer 21 (*)    GFR calc Af Amer 24 (*)    All other components within normal limits  URINALYSIS, ROUTINE W REFLEX MICROSCOPIC - Abnormal; Notable for the following components:   APPearance HAZY (*)    Hgb urine dipstick SMALL (*)    Protein, ur >=300 (*)    Bacteria, UA RARE (*)    All other components within normal limits  LIPASE, BLOOD  CBC    EKG  EKG Interpretation None       Radiology Ct Abdomen Pelvis Wo Contrast  Result Date: 12/01/2017 CLINICAL DATA:  Acute onset of centralized abdominal pain, nausea and vomiting. EXAM: CT ABDOMEN AND PELVIS WITHOUT CONTRAST TECHNIQUE: Multidetector CT imaging of the abdomen and pelvis was performed following the standard protocol without IV contrast. COMPARISON:  CT of the abdomen and pelvis from 06/01/2017, and renal ultrasound performed 06/22/2017 FINDINGS: Lower chest: The visualized lung bases are grossly clear. The visualized portions of the mediastinum are unremarkable. Hepatobiliary: The liver is unremarkable in appearance. The patient is status post cholecystectomy, with clips noted along the gallbladder fossa. The common bile duct remains normal in caliber. Trace ascites is noted tracking about the liver. Pancreas: The pancreas is within normal limits. Spleen: The spleen is unremarkable in appearance. Adrenals/Urinary Tract: The adrenal glands are unremarkable in appearance. Left-sided perinephric fluid is noted. A small left renal cyst is seen. This could reflect underlying pyelonephritis. No renal or ureteral stones are identified. The right kidney is unremarkable in appearance. Stomach/Bowel: The stomach is unremarkable in appearance. Diffuse mesenteric edema is noted at the right lower quadrant, adjacent to the distal ileum, with suggestion of ileal wall thickening. This may reflect an acute infectious or inflammatory ileitis. Trace free fluid is seen tracking along the  paracolic gutters and within the pelvis. The appendix is normal in caliber, without evidence of appendicitis. The colon is unremarkable in appearance. Vascular/Lymphatic: Scattered calcification is seen along the abdominal aorta and its branches. The abdominal aorta is otherwise grossly unremarkable. The  inferior vena cava is grossly unremarkable. No retroperitoneal lymphadenopathy is seen. No pelvic sidewall lymphadenopathy is identified. Reproductive: The bladder is mildly distended and grossly unremarkable. The prostate remains normal in size, with minimal calcification. Other: No additional soft tissue abnormalities are seen. Musculoskeletal: No acute osseous abnormalities are identified. The visualized musculature is unremarkable in appearance. IMPRESSION: 1. Diffuse mesenteric edema at the right lower quadrant, adjacent to the distal ileum, with suggestion of ileal wall thickening. This may reflect an infectious or inflammatory ileitis. Trace ascites noted within the abdomen and pelvis. 2. Left-sided perinephric fluid, with small left renal cyst. This could reflect underlying pyelonephritis, or may extend from the ascites. Aortic Atherosclerosis (ICD10-I70.0). Electronically Signed   By: Garald Balding M.D.   On: 12/01/2017 23:09    Procedures Procedures (including critical care time)  Medications Ordered in ED Medications  ondansetron (ZOFRAN-ODT) disintegrating tablet 4 mg (4 mg Oral Given 12/01/17 1448)  ondansetron (ZOFRAN) injection 4 mg (4 mg Intravenous Given by Other 12/01/17 2155)  morphine 4 MG/ML injection 4 mg (4 mg Intravenous Given 12/01/17 2331)     Initial Impression / Assessment and Plan / ED Course  I have reviewed the triage vital signs and the nursing notes.  Pertinent labs & imaging results that were available during my care of the patient were reviewed by me and considered in my medical decision making (see chart for details).     The patient is feeling dramatically better  following IV medications.  The patient is advised he will need follow-up with GI.  We will place him on antibiotics.  Patient is advised to return here for any worsening in his condition patient agrees to the plan and all questions were answered  Final Clinical Impressions(s) / ED Diagnoses   Final diagnoses:  None    ED Discharge Orders    None       Dalia Heading, PA-C 12/02/17 0030    Fredia Sorrow, MD 12/03/17 [phone]

## 2017-12-03 ENCOUNTER — Ambulatory Visit: Payer: Medicaid Other | Admitting: Family Medicine

## 2017-12-03 ENCOUNTER — Telehealth: Payer: Self-pay | Admitting: Internal Medicine

## 2017-12-03 NOTE — Telephone Encounter (Signed)
Informed pt is okay to continue antibiotic and have procedure 2-12  Albert Eaton PV

## 2017-12-04 ENCOUNTER — Telehealth: Payer: Self-pay | Admitting: Internal Medicine

## 2017-12-04 NOTE — Telephone Encounter (Signed)
Pt calling because when he was seen in the ER the other day for colitis he was told he needed to schedule a follow-up appt. Discussed with pt that he needs to keep his colon appt on 12/08/17 and Dr. Hilarie Fredrickson will decide at that time when he would need to follow-up. Pt verbalized understanding.

## 2017-12-05 ENCOUNTER — Other Ambulatory Visit: Payer: Self-pay | Admitting: Physician Assistant

## 2017-12-05 ENCOUNTER — Other Ambulatory Visit: Payer: Self-pay | Admitting: Infectious Diseases

## 2017-12-07 ENCOUNTER — Other Ambulatory Visit: Payer: Self-pay | Admitting: *Deleted

## 2017-12-07 DIAGNOSIS — B20 Human immunodeficiency virus [HIV] disease: Secondary | ICD-10-CM

## 2017-12-07 MED ORDER — ABACAVIR SULFATE 300 MG PO TABS: 300.0000 mg | ORAL_TABLET | Freq: Every day | ORAL | 4 refills | Status: DC

## 2017-12-08 ENCOUNTER — Ambulatory Visit (AMBULATORY_SURGERY_CENTER): Payer: Medicaid Other | Admitting: Internal Medicine

## 2017-12-08 ENCOUNTER — Encounter: Payer: Self-pay | Admitting: Internal Medicine

## 2017-12-08 ENCOUNTER — Other Ambulatory Visit: Payer: Self-pay

## 2017-12-08 VITALS — BP 156/105 | HR 71 | Temp 99.1°F | Resp 9 | Ht 70.0 in | Wt 221.0 lb

## 2017-12-08 DIAGNOSIS — D120 Benign neoplasm of cecum: Secondary | ICD-10-CM | POA: Diagnosis not present

## 2017-12-08 DIAGNOSIS — D122 Benign neoplasm of ascending colon: Secondary | ICD-10-CM | POA: Diagnosis not present

## 2017-12-08 DIAGNOSIS — D125 Benign neoplasm of sigmoid colon: Secondary | ICD-10-CM

## 2017-12-08 DIAGNOSIS — K635 Polyp of colon: Secondary | ICD-10-CM

## 2017-12-08 DIAGNOSIS — Z1211 Encounter for screening for malignant neoplasm of colon: Secondary | ICD-10-CM

## 2017-12-08 DIAGNOSIS — D123 Benign neoplasm of transverse colon: Secondary | ICD-10-CM

## 2017-12-08 MED ORDER — SODIUM CHLORIDE 0.9 % IV SOLN: 500.0000 mL | Freq: Once | INTRAVENOUS | Status: DC

## 2017-12-08 NOTE — Progress Notes (Signed)
Called to room to assist during endoscopic procedure.  Patient ID and intended procedure confirmed with present staff. Received instructions for my participation in the procedure from the performing physician.  

## 2017-12-08 NOTE — Patient Instructions (Signed)
YOU HAD AN ENDOSCOPIC PROCEDURE TODAY AT [facility]:   Refer to the procedure report that was given to you for any specific questions about what was found during the examination.  If the procedure report does not answer your questions, please call your gastroenterologist to clarify.  If you requested that your care partner not be given the details of your procedure findings, then the procedure report has been included in a sealed envelope for you to review at your convenience later.  YOU SHOULD EXPECT: Some feelings of bloating in the abdomen. Passage of more gas than usual.  Walking can help get rid of the air that was put into your GI tract during the procedure and reduce the bloating. If you had a lower endoscopy (such as a colonoscopy or flexible sigmoidoscopy) you may notice spotting of blood in your stool or on the toilet paper. If you underwent a bowel prep for your procedure, you may not have a normal bowel movement for a few days.  Please Note:  You might notice some irritation and congestion in your nose or some drainage.  This is from the oxygen used during your procedure.  There is no need for concern and it should clear up in a day or so.  SYMPTOMS TO REPORT IMMEDIATELY:   Following lower endoscopy (colonoscopy or flexible sigmoidoscopy):  Excessive amounts of blood in the stool  Significant tenderness or worsening of abdominal pains  Swelling of the abdomen that is new, acute  Fever of 100F or higher   For urgent or emergent issues, a gastroenterologist can be reached at any hour by calling [phone].   DIET:  We do recommend a small meal at first, but then you may proceed to your regular diet.  Drink plenty of fluids but you should avoid alcoholic beverages for 24 hours.  ACTIVITY:  You should plan to take it easy for the rest of today and you should NOT DRIVE or use heavy machinery until tomorrow (because of the sedation medicines used during the test).     FOLLOW UP: Our staff will call the number listed on your records the next business day following your procedure to check on you and address any questions or concerns that you may have regarding the information given to you following your procedure. If we do not reach you, we will leave a message.  However, if you are feeling well and you are not experiencing any problems, there is no need to return our call.  We will assume that you have returned to your regular daily activities without incident.  If any biopsies were taken you will be contacted by phone or by letter within the next 1-3 weeks.  Please call us at [phone] if you have not heard about the biopsies in 3 weeks.    SIGNATURES/CONFIDENTIALITY: You and/or your care partner have signed paperwork which will be entered into your electronic medical record.  These signatures attest to the fact that that the information above on your After Visit Summary has been reviewed and is understood.  Full responsibility of the confidentiality of this discharge information lies with you and/or your care-partner.    Handouts were given to your care partner on polyps and hemorrhoids. You may resume your current medications today. Await biopsy results. Please call if any questions or concerns.

## 2017-12-08 NOTE — Progress Notes (Signed)
No problems noted in the recovery room. maw 

## 2017-12-08 NOTE — Op Note (Addendum)
[facility] Patient Name: Albert Eaton Procedure Date: 12/08/2017 10:10 AM MRN: [id number] Endoscopist: Jerene Bears , MD Age: 52 Referring MD:  Date of Birth: June 24, 1966 Gender: Male Account #: [id number] Procedure:                Colonoscopy Indications:              Screening for colorectal malignant neoplasm, This                            is the patient's first colonoscopy; incidental                            acute n/v/abd pain illness with CT abd/pelvis                            showing inflammation in the distal ileum (Feb 2019                            and May 2018) Medicines:                Monitored Anesthesia Care Procedure:                Pre-Anesthesia Assessment:                           - Prior to the procedure, a History and Physical                            was performed, and patient medications and                            allergies were reviewed. The patient's tolerance of                            previous anesthesia was also reviewed. The risks                            and benefits of the procedure and the sedation                            options and risks were discussed with the patient.                            All questions were answered, and informed consent                            was obtained. Prior Anticoagulants: The patient has                            taken no previous anticoagulant or antiplatelet                            agents. ASA Grade Assessment: III - A patient with  severe systemic disease. After reviewing the risks                            and benefits, the patient was deemed in                            satisfactory condition to undergo the procedure.                           After obtaining informed consent, the colonoscope                            was passed under direct vision. Throughout the                            procedure, the patient's blood pressure, pulse, and                         oxygen saturations were monitored continuously. The                            Colonoscope was introduced through the anus and                            advanced to the the terminal ileum. The colonoscopy                            was performed without difficulty. The patient                            tolerated the procedure well. The quality of the                            bowel preparation was excellent. The quality of the                            bowel preparation was excellent. The terminal                            ileum, ileocecal valve, appendiceal orifice, and                            rectum were photographed. Scope In: 10:23:51 AM Scope Out: 10:44:08 AM Scope Withdrawal Time: 0 hours 15 minutes 12 seconds  Total Procedure Duration: 0 hours 20 minutes 17 seconds  Findings:                 The digital rectal exam was normal.                           The terminal ileum appeared normal. There was no                            evidence of ileitis seen in the very distal  terminal ileum. Deep ileal intubation was not                            possible today.                           The retroflexed view of the distal rectum and anal                            verge was normal and showed no anal or rectal                            abnormalities.                           Two sessile polyps were found in the cecum. The                            polyps were 2 to 3 mm in size. These polyps were                            removed with a cold biopsy forceps. Resection and                            retrieval were complete.                           A 3 mm polyp was found in the ascending colon. The                            polyp was sessile. The polyp was removed with a                            cold biopsy forceps. Resection and retrieval were                            complete.                           A 10 mm polyp was  found in the hepatic flexure. The                            polyp was sessile. The polyp was removed with a                            cold snare. Resection and retrieval were complete.                           A 4 mm polyp was found in the sigmoid colon. The                            polyp was sessile. The polyp was removed with a  cold snare. Resection and retrieval were complete.                           Internal hemorrhoids were found during                            retroflexion. The hemorrhoids were small. Complications:            No immediate complications. Estimated Blood Loss:     Estimated blood loss was minimal. Impression:               - The examined portion of the ileum was normal.                           - The distal rectum and anal verge are normal on                            retroflexion view.                           - Two 2 to 3 mm polyps in the cecum, removed with a                            cold biopsy forceps. Resected and retrieved.                           - One 3 mm polyp in the ascending colon, removed                            with a cold biopsy forceps. Resected and retrieved.                           - One 10 mm polyp at the hepatic flexure, removed                            with a cold snare. Resected and retrieved.                           - One 4 mm polyp in the sigmoid colon, removed with                            a cold snare. Resected and retrieved.                           - Small internal hemorrhoids. Recommendation:           - Patient has a contact number available for                            emergencies. The signs and symptoms of potential                            delayed complications were discussed with the  patient. Return to normal activities tomorrow.                            Written discharge instructions were provided to the                            patient.                            - Resume previous diet.                           - Continue present medications.                           - Await pathology results.                           - Repeat colonoscopy is recommended. The                            colonoscopy date will be determined after pathology                            results from today's exam become available for                            review. Jerene Bears, MD 12/08/2017 10:51:15 AM This report has been signed electronically.

## 2017-12-08 NOTE — Progress Notes (Signed)
Pt's states no medical or surgical changes since previsit or office visit. 

## 2017-12-08 NOTE — Progress Notes (Signed)
Spontaneous respirations throughout. VSS. Resting comfortably. To PACU on room air. Report to  RN. 

## 2017-12-09 ENCOUNTER — Ambulatory Visit (INDEPENDENT_AMBULATORY_CARE_PROVIDER_SITE_OTHER): Payer: Medicaid Other | Admitting: Family Medicine

## 2017-12-09 ENCOUNTER — Telehealth: Payer: Self-pay

## 2017-12-09 ENCOUNTER — Encounter: Payer: Self-pay | Admitting: Family Medicine

## 2017-12-09 ENCOUNTER — Telehealth: Payer: Self-pay | Admitting: *Deleted

## 2017-12-09 ENCOUNTER — Ambulatory Visit (HOSPITAL_COMMUNITY)
Admission: RE | Admit: 2017-12-09 | Discharge: 2017-12-09 | Disposition: A | Discharge status: Home / Self Care | Payer: Medicaid Other | Source: Ambulatory Visit | Attending: Family Medicine | Admitting: Family Medicine

## 2017-12-09 VITALS — BP 136/90 | HR 72 | Temp 98.3°F | Resp 16 | Ht 70.0 in | Wt 220.0 lb

## 2017-12-09 DIAGNOSIS — J309 Allergic rhinitis, unspecified: Secondary | ICD-10-CM | POA: Diagnosis not present

## 2017-12-09 DIAGNOSIS — I129 Hypertensive chronic kidney disease with stage 1 through stage 4 chronic kidney disease, or unspecified chronic kidney disease: Secondary | ICD-10-CM | POA: Diagnosis not present

## 2017-12-09 DIAGNOSIS — I110 Hypertensive heart disease with heart failure: Secondary | ICD-10-CM

## 2017-12-09 DIAGNOSIS — R053 Chronic cough: Secondary | ICD-10-CM

## 2017-12-09 DIAGNOSIS — R05 Cough: Secondary | ICD-10-CM | POA: Diagnosis not present

## 2017-12-09 DIAGNOSIS — G4733 Obstructive sleep apnea (adult) (pediatric): Secondary | ICD-10-CM | POA: Diagnosis not present

## 2017-12-09 DIAGNOSIS — I7090 Unspecified atherosclerosis: Secondary | ICD-10-CM

## 2017-12-09 DIAGNOSIS — N184 Chronic kidney disease, stage 4 (severe): Secondary | ICD-10-CM

## 2017-12-09 DIAGNOSIS — R0609 Other forms of dyspnea: Secondary | ICD-10-CM

## 2017-12-09 DIAGNOSIS — I5032 Chronic diastolic (congestive) heart failure: Secondary | ICD-10-CM | POA: Diagnosis not present

## 2017-12-09 IMAGING — DX DG CHEST 2V
2 series · 2 of 2 positions shown · non-contrast
Comparison: [DATE]

CLINICAL DATA: Persistent cough for 3-4 weeks.

EXAM:
CHEST  2 VIEW

[chest pa]
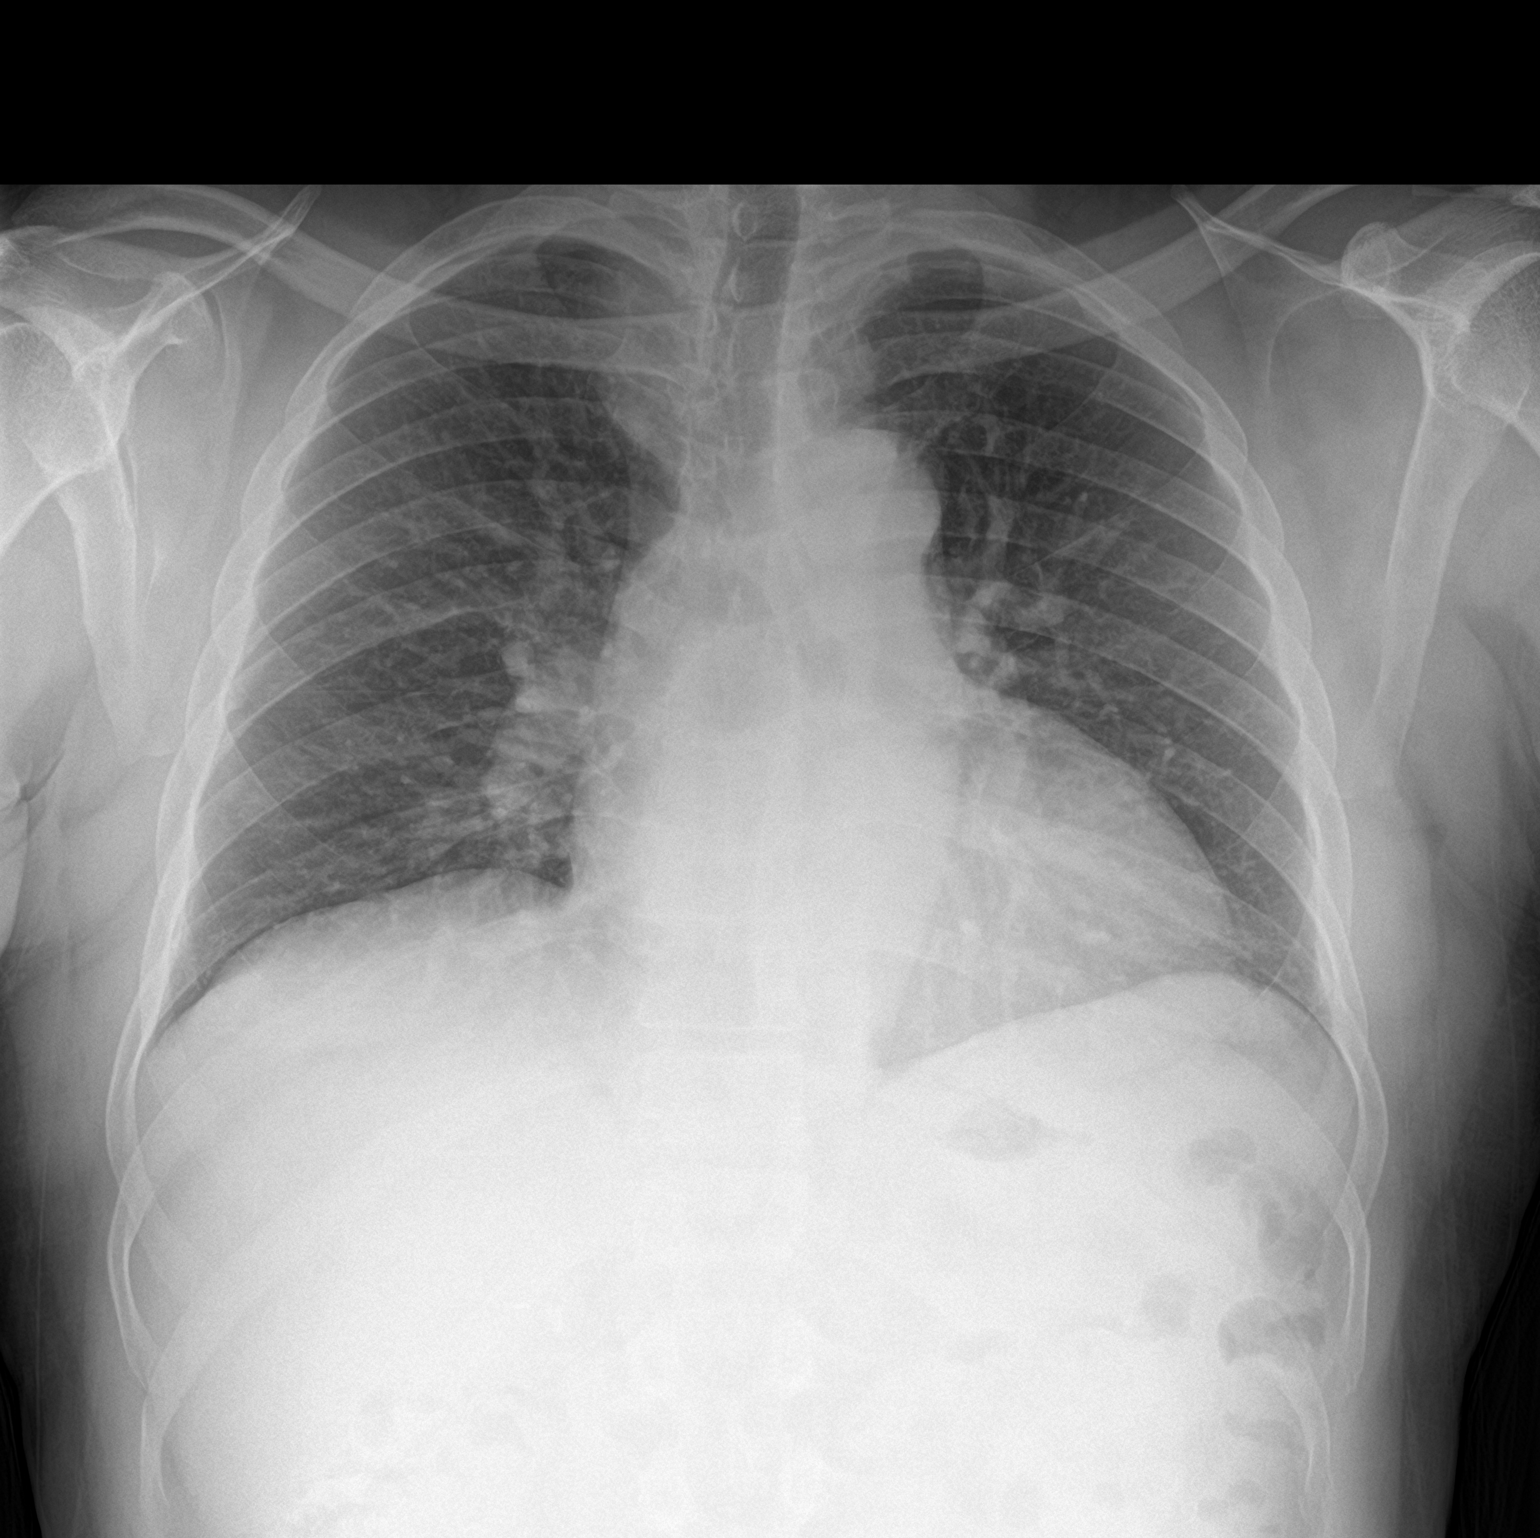

[chest lat]
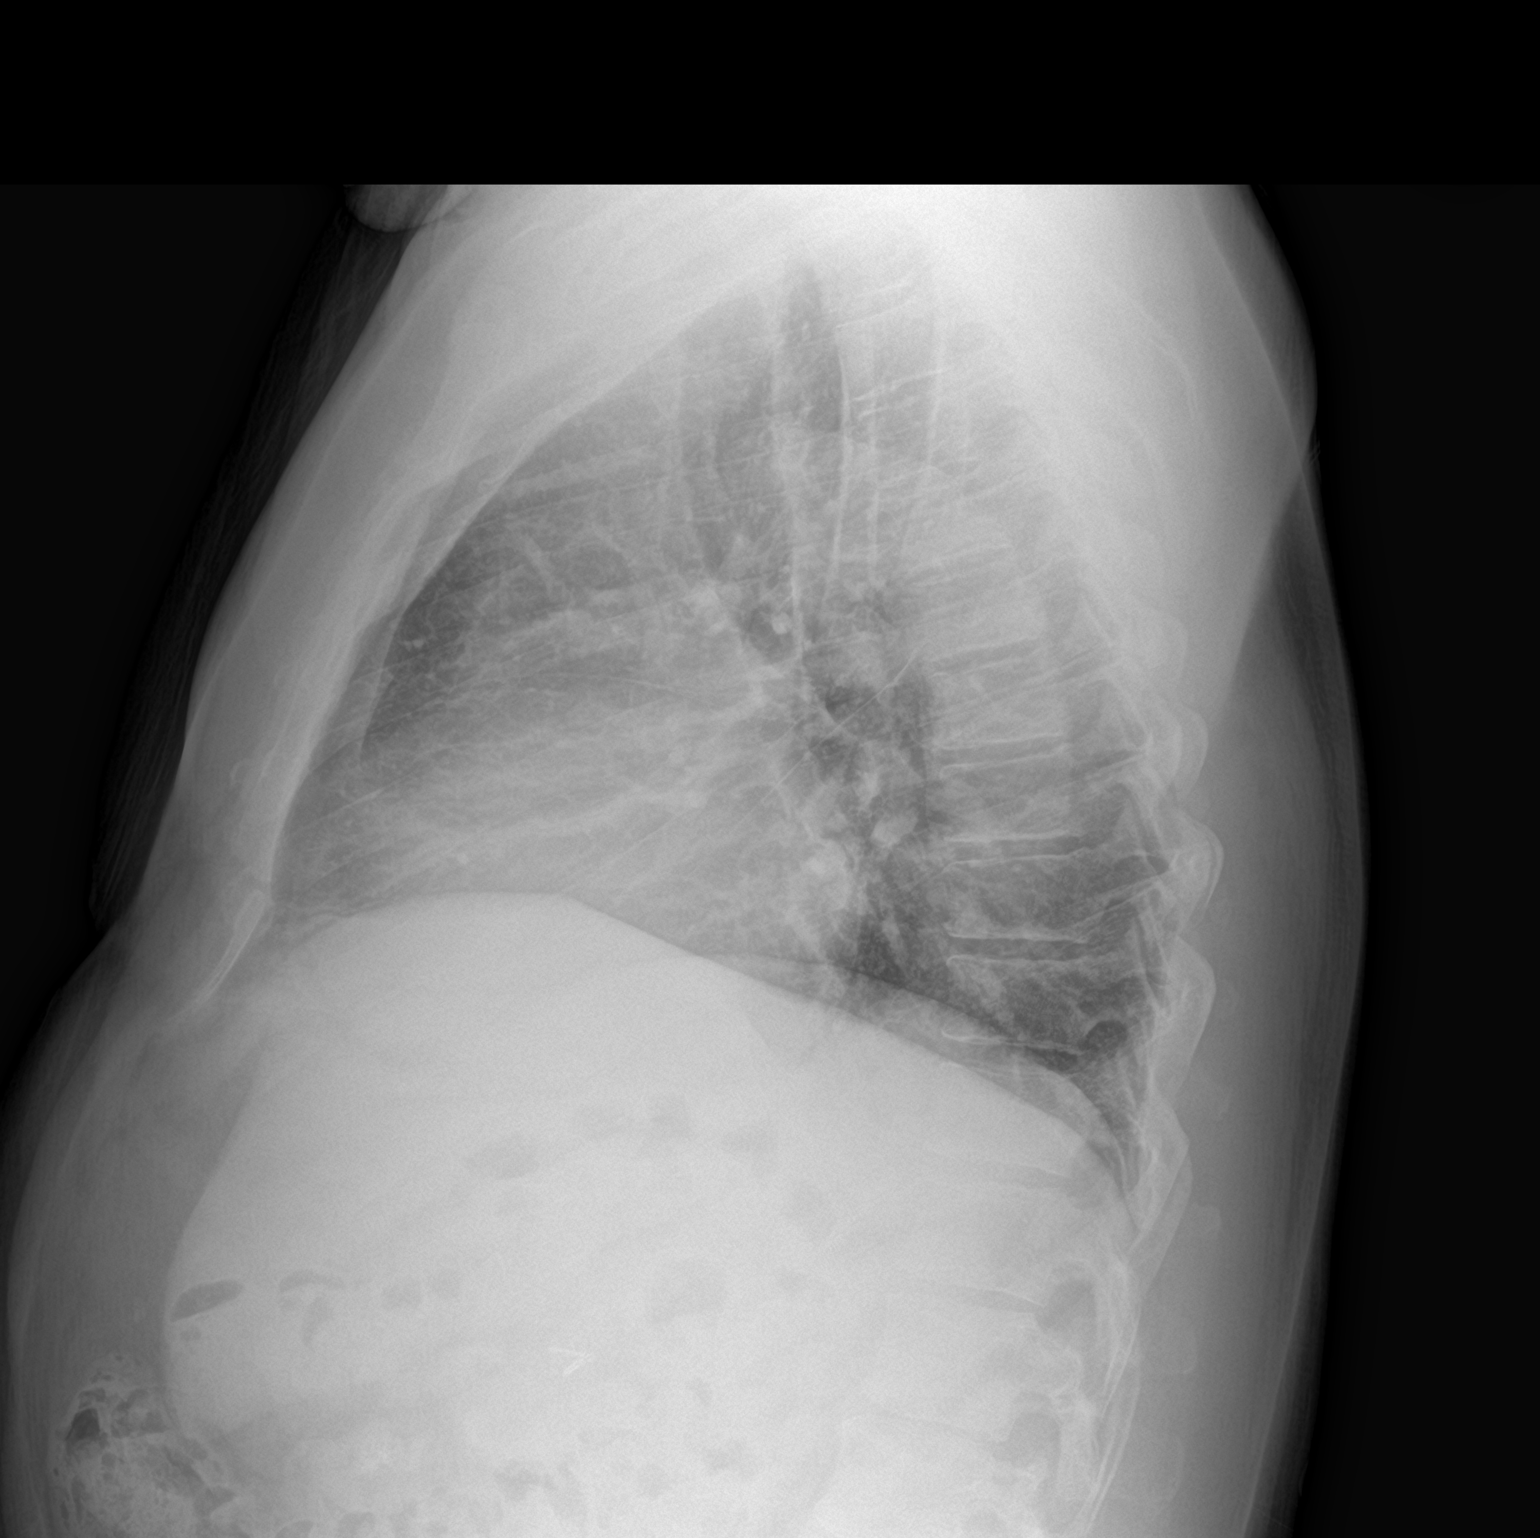

[2 of 2 positions shown; findings below may reference images not displayed]

FINDINGS: There is no focal parenchymal opacity. There is no pleural effusion
or pneumothorax. There is stable cardiomegaly.

The osseous structures are unremarkable.
IMPRESSION: No active cardiopulmonary disease.

## 2017-12-09 MED ORDER — LEVOCETIRIZINE DIHYDROCHLORIDE 5 MG PO TABS: 5.0000 mg | ORAL_TABLET | Freq: Every evening | ORAL | 11 refills | Status: DC

## 2017-12-09 NOTE — Telephone Encounter (Signed)
  Follow up Call-  Call back number 12/08/2017  Post procedure Call Back phone  # [phone]  Permission to leave phone message Yes     Patient questions:  Do you have a fever, pain , or abdominal swelling? No. Pain Score  0 *  Have you tolerated food without any problems? Yes.    Have you been able to return to your normal activities? Yes.    Do you have any questions about your discharge instructions: Diet   No. Medications  No. Follow up visit  No.  Do you have questions or concerns about your Care? No.  Actions: * If pain score is 4 or above: No action needed, pain <4.

## 2017-12-09 NOTE — Progress Notes (Signed)
Subjective:    Albert Eaton is a 52 y.o. male with a history of HIV, stage CKD a who presents for evaluation of shortness of breath and sleep apnea. Dyspnea has been mild and intermittent. Episodes usually occur intermittently. Associated symptoms include: leg swelling. The symptoms are aggravated by climbing stairs and recumbency. Patient generally gets 4 or 5 hours of sleep per night, and states they generally have poor quality.  Snoring of moderate severity is present. Apneic episodes are present.   Patient is also here for evaluation of possible allergic rhinitis. Patient's symptoms include itchy eyes, itchy nose, nasal congestion and watery eyes. These symptoms are perennial. Current triggers include exposure to pollens, dust and mold. The patient has been suffering from these symptoms for several weeks. The patient has tried not attempted any OTC antihistamines for this problem. He is requesting a referral for allergy testing.   Past Medical History:  Diagnosis Date  . Anemia   . Chronic diastolic CHF (congestive heart failure) ([place]) 01/06/2017   Echo 7/16 [facility] in [place], [name]) Mild AI, mild LAE, mild concentric LVH, EF 55, normal wall motion, mild to moderate MR, mild PI, RVSP 55 // Echo 10/09/16 ([facility]):  Moderate LVH, grade 2 diastolic dysfunction, mild MR, moderate LAE   . CKD (chronic kidney disease)    per pt, waiting on a kidney transplant in near future.  . Gout   . History of nuclear stress test    a. Nuc study 7/16: no scar or ischemia, EF 42 // b. Nuc study 12/17: EF 48, ?small apical ischemia, Low Risk  . HIV (human immunodeficiency virus infection) ([place])   . HLD (hyperlipidemia)   . Hypertension   . Hypertensive heart disease with CHF (congestive heart failure) ([place]) 10/09/2016  . OSA (obstructive sleep apnea)   . Post-operative nausea and vomiting    1 time as a child  . Sleep apnea    uses c-pap   Social History   Socioeconomic History  . Marital  status: Single    Spouse name: Not on file  . Number of children: Not on file  . Years of education: Not on file  . Highest education level: Not on file  Social Needs  . Financial resource strain: Not on file  . Food insecurity - worry: Not on file  . Food insecurity - inability: Not on file  . Transportation needs - medical: Not on file  . Transportation needs - non-medical: Not on file  Occupational History  . Not on file  Tobacco Use  . Smoking status: Former Smoker    Last attempt to quit: 2000    Years since quitting: 19.1  . Smokeless tobacco: Never Used  Substance and Sexual Activity  . Alcohol use: Yes    Alcohol/week: 1.2 oz    Types: 2 Shots of liquor per week    Comment: Light use  . Drug use: No  . Sexual activity: No    Partners: Male    Birth control/protection: Condom    Comment: declined condoms  Other Topics Concern  . Not on file  Social History Narrative  . Not on file   Immunization History  Administered Date(s) Administered  . Influenza,inj,Quad PF,6+ Mos 10/10/2016, 08/31/2017  . Meningococcal Mcv4o 10/15/2016, 08/31/2017  . Pneumococcal Polysaccharide-23 10/15/2016  . Tdap 01/27/2017   Review of Systems  Constitutional: Negative.  Negative for malaise/fatigue.  HENT: Negative.   Eyes: Negative.   Respiratory: Positive for cough and shortness of  breath.   Cardiovascular: Negative.  Negative for chest pain and leg swelling.  Gastrointestinal: Negative.   Genitourinary: Negative.  Negative for dysuria, frequency and urgency.  Musculoskeletal: Negative.  Negative for back pain and joint pain.  Skin: Negative.   Neurological: Negative.  Negative for weakness.  Endo/Heme/Allergies: Negative.   Psychiatric/Behavioral: Negative.    Objective:  Physical Exam  Constitutional: He is well-developed, well-nourished, and in no distress.  HENT:  Head: Normocephalic and atraumatic.  Right Ear: External ear normal.  Left Ear: External ear normal.   Nose: Nose normal.  Mouth/Throat: Oropharynx is clear and moist.  Eyes: Pupils are equal, round, and reactive to light.  Neck: Normal range of motion. Neck supple.  Abdominal: Soft. Bowel sounds are normal.  Skin: Skin is warm and dry.  Psychiatric: Mood, memory, affect and judgment normal.  Imaging Chest x-ray: normal    Assessment:  BP 136/90 (BP Location: Right Arm, Patient Position: Sitting, Cuff Size: Large) Comment: manually  Pulse 72   Temp 98.3 F (36.8 C) (Oral)   Resp 16   Ht 5' 10"  (1.778 m)   Wt 220 lb (99.8 kg)   SpO2 97%   BMI 31.57 kg/m   Plan  1. Allergic rhinitis, unspecified seasonality, unspecified trigger We will start a trial of levocetirizine 5 mg at bedtime.  Allergy testing is not warranted at this time. - levocetirizine (XYZAL) 5 MG tablet; Take 1 tablet (5 mg total) by mouth every evening.  Dispense: 30 tablet; Refill: 11  2. Persistent cough for 3 weeks or longer Will review chest x-ray and follow-up by phone. - DG Chest 2 View; Future  3. Chronic diastolic CHF (congestive heart failure) ([place]), - Brain natriuretic peptide - Lipid Panel  4. Dyspnea on exertion  - DG Chest 2 View; Future  5. OSA (obstructive sleep apnea) - Split night study; Future  6. Atherosclerosis - Lipid Panel  7. Hypertensive heart disease with congestive heart failure, unspecified heart failure type ([place]) - Basic Metabolic Panel  8. CKD stage 4 secondary to hypertension ([place]) - Basic Metabolic Panel   RTC: Will follow up by phone with abnormal laboratory results.   [name]  MSN, FNP-C [facility] [address] [place], [place] [address] [phone]

## 2017-12-09 NOTE — Patient Instructions (Addendum)
Will start a trial of Xyzal 5 mg daily for allergic rhinitis For persistent cough and shortness of breath, will order the following:  Will order a stat chest xray and will follow up by phone after reviewing results Will review labs to rule out worsening congestive heart failure  I have sent a referral for a split night sleep study. Will call you to set up appointment   Shortness of Breath, Adult Shortness of breath means you have trouble breathing. Your lungs are organs for breathing. Follow these instructions at home: Pay attention to any changes in your symptoms. Take these actions to help with your condition:  Do not smoke. Smoking can cause shortness of breath. If you need help to quit smoking, ask your doctor.  Avoid things that can make it harder to breathe, such as: ? Mold. ? Dust. ? Air pollution. ? Chemical smells. ? Things that can cause allergy symptoms (allergens), if you have allergies.  Keep your living space clean and free of mold and dust.  Rest as needed. Slowly return to your usual activities.  Take over-the-counter and prescription medicines, including oxygen and inhaled medicines, only as told by your doctor.  Keep all follow-up visits as told by your doctor. This is important.  Contact a doctor if:  Your condition does not get better as soon as expected.  You have a hard time doing your normal activities, even after you rest.  You have new symptoms. Get help right away if:  You have trouble breathing when you are resting.  You feel light-headed or you faint.  You have a cough that is not helped by medicines.  You cough up blood.  You have pain with breathing.  You have pain in your chest, arms, shoulders, or belly (abdomen).  You have a fever.  You cannot walk up stairs.  You cannot exercise the way you normally do. This information is not intended to replace advice given to you by your health care provider. Make sure you discuss any  questions you have with your health care provider. Document Released: 03/31/2008 Document Revised: 10/30/2016 Document Reviewed: 10/30/2016 Elsevier Interactive Patient Education  2017 Elsevier Inc.  Sleep Apnea Sleep apnea is a condition that affects breathing. People with sleep apnea have moments during sleep when their breathing pauses briefly or gets shallow. Sleep apnea can cause these symptoms:  Trouble staying asleep.  Sleepiness or tiredness during the day.  Irritability.  Loud snoring.  Morning headaches.  Trouble concentrating.  Forgetting things.  Less interest in sex.  Being sleepy for no reason.  Mood swings.  Personality changes.  Depression.  Waking up a lot during the night to pee (urinate).  Dry mouth.  Sore throat.  Follow these instructions at home:  Make any changes in your routine that your doctor recommends.  Eat a healthy, well-balanced diet.  Take over-the-counter and prescription medicines only as told by your doctor.  Avoid using alcohol, calming medicines (sedatives), and narcotic medicines.  Take steps to lose weight if you are overweight.  If you were given a machine (device) to use while you sleep, use it only as told by your doctor.  Do not use any tobacco products, such as cigarettes, chewing tobacco, and e-cigarettes. If you need help quitting, ask your doctor.  Keep all follow-up visits as told by your doctor. This is important. Contact a doctor if:  The machine that you were given to use during sleep is uncomfortable or does not seem to be  working.  Your symptoms do not get better.  Your symptoms get worse. Get help right away if:  Your chest hurts.  You have trouble breathing in enough air (shortness of breath).  You have an uncomfortable feeling in your back, arms, or stomach.  You have trouble talking.  One side of your body feels weak.  A part of your face is hanging down (drooping). These symptoms may  be an emergency. Do not wait to see if the symptoms will go away. Get medical help right away. Call your local emergency services (911 in the [place] Do not drive yourself to the hospital. This information is not intended to replace advice given to you by your health care provider. Make sure you discuss any questions you have with your health care provider. Document Released: 07/22/2008 Document Revised: 06/08/2016 Document Reviewed: 07/23/2015 Elsevier Interactive Patient Education  [employer].

## 2017-12-09 NOTE — Telephone Encounter (Signed)
Left message on answering machine. 

## 2017-12-10 ENCOUNTER — Other Ambulatory Visit: Payer: Self-pay | Admitting: Family Medicine

## 2017-12-10 DIAGNOSIS — E785 Hyperlipidemia, unspecified: Secondary | ICD-10-CM

## 2017-12-10 LAB — BASIC METABOLIC PANEL
BUN / CREAT RATIO: 7 — AB (ref 9–20)
BUN: 22 mg/dL (ref 6–24)
CO2: 21 mmol/L (ref 20–29)
CREATININE: 3.05 mg/dL — AB (ref 0.76–1.27)
Calcium: 9.5 mg/dL (ref 8.7–10.2)
Chloride: 103 mmol/L (ref 96–106)
GFR, EST AFRICAN AMERICAN: 26 mL/min/{1.73_m2} — AB (ref >59)
GFR, EST NON AFRICAN AMERICAN: 23 mL/min/{1.73_m2} — AB (ref >59)
Glucose: 110 mg/dL — ABNORMAL HIGH (ref 65–99)
Potassium: 3.5 mmol/L (ref 3.5–5.2)
SODIUM: 142 mmol/L (ref 134–144)

## 2017-12-10 LAB — LIPID PANEL
CHOLESTEROL TOTAL: 180 mg/dL (ref 100–199)
Chol/HDL Ratio: 5.3 ratio — ABNORMAL HIGH (ref 0.0–5.0)
HDL: 34 mg/dL — AB (ref >39)
LDL Calculated: 113 mg/dL — ABNORMAL HIGH (ref 0–99)
TRIGLYCERIDES: 166 mg/dL — AB (ref 0–149)
VLDL CHOLESTEROL CAL: 33 mg/dL (ref 5–40)

## 2017-12-10 LAB — BRAIN NATRIURETIC PEPTIDE: BNP: 76.9 pg/mL (ref 0.0–100.0)

## 2017-12-10 MED ORDER — ATORVASTATIN CALCIUM 20 MG PO TABS: 40.0000 mg | ORAL_TABLET | Freq: Every day | ORAL | 5 refills | Status: DC

## 2017-12-10 NOTE — Progress Notes (Signed)
Meds ordered this encounter  Medications  . atorvastatin (LIPITOR) 20 MG tablet    Sig: Take 2 tablets (40 mg total) by mouth daily.    Dispense:  30 tablet    Refill:  [place]  MSN, FNP-C [facility] [address] [place], [place] [address] [phone]

## 2017-12-11 ENCOUNTER — Telehealth: Payer: Self-pay

## 2017-12-11 ENCOUNTER — Encounter: Payer: Self-pay | Admitting: Internal Medicine

## 2017-12-11 NOTE — Telephone Encounter (Signed)
Called and spoke with patient, advised that chest xray is negative and that all cardiac labs are within a normal range. Advised that cholesterol is elevated and that we will start atorvastatin 9m every evening with dinner. Asked that patient keep next scheduled appointment. Patient verbalized understanding and will keep next scheduled appointment. Thanks!

## 2017-12-11 NOTE — Telephone Encounter (Signed)
-----   Message from [name], [place] sent at 12/10/2017  4:45 PM EST ----- Regarding: lab results Please inform patient that chest xray is negative. Cardiac labs are also within a normal range.  Cholesterol elevated, will start atorvastatin 20 mg every evening with dinner. Have patient follow up in office as scheduled.   Thanks

## 2017-12-24 ENCOUNTER — Ambulatory Visit (HOSPITAL_BASED_OUTPATIENT_CLINIC_OR_DEPARTMENT_OTHER): Payer: Medicaid Other | Attending: Family Medicine | Admitting: Internal Medicine

## 2017-12-24 DIAGNOSIS — G4733 Obstructive sleep apnea (adult) (pediatric): Secondary | ICD-10-CM | POA: Diagnosis not present

## 2017-12-24 DIAGNOSIS — Z79899 Other long term (current) drug therapy: Secondary | ICD-10-CM | POA: Insufficient documentation

## 2017-12-24 DIAGNOSIS — R0683 Snoring: Secondary | ICD-10-CM | POA: Insufficient documentation

## 2017-12-24 DIAGNOSIS — I493 Ventricular premature depolarization: Secondary | ICD-10-CM | POA: Diagnosis not present

## 2017-12-30 ENCOUNTER — Telehealth: Payer: Self-pay

## 2017-12-30 DIAGNOSIS — G4733 Obstructive sleep apnea (adult) (pediatric): Secondary | ICD-10-CM

## 2017-12-30 NOTE — Telephone Encounter (Signed)
-----   Message from [name], [place] sent at 12/30/2017  2:41 PM EST ----- Please inform patient that he has obstructive sleep apnea, form has been faxed to [facility] for CPAP. Follow up in office as scheduled.  Thanks

## 2017-12-30 NOTE — Procedures (Signed)
   Patient Name: Albert Eaton, Albert Eaton Study Date: 12/24/2017 Gender: Male D.O.B: [birth year] Age (years): 51 Referring Provider: Cammie Sickle Height (inches): 20 Interpreting Physician: Baird Lyons MD, ABSM Weight (lbs): 210 RPSGT: Zadie Rhine BMI: 30 MRN: [id number] Neck Size: 16.50 <br> <br> CLINICAL INFORMATION Sleep Study Type: NPSG  Indication for sleep study: OSA, Snoring  Epworth Sleepiness Score: 13  SLEEP STUDY TECHNIQUE As per the AASM Manual for the Scoring of Sleep and Associated Events v2.3 (April 2016) with a hypopnea requiring 4% desaturations.  The channels recorded and monitored were frontal, central and occipital EEG, electrooculogram (EOG), submentalis EMG (chin), nasal and oral airflow, thoracic and abdominal wall motion, anterior tibialis EMG, snore microphone, electrocardiogram, and pulse oximetry.  MEDICATIONS Medications self-administered by patient taken the night of the study : LIPITOR, COREG, PEPCID, APRESOLINE, ISENTRESS, XYZAL, ALDACTONE  SLEEP ARCHITECTURE The study was initiated at 10:38:59 PM and ended at 4:38:30 AM.  Sleep onset time was 32.7 minutes and the sleep efficiency was 79.0%%. The total sleep time was 284.0 minutes.  Stage REM latency was 183.5 minutes.  The patient spent 5.1%% of the night in stage N1 sleep, 78.9%% in stage N2 sleep, 2.1%% in stage N3 and 13.91% in REM.  Alpha intrusion was absent.  Supine sleep was 69.23%.  RESPIRATORY PARAMETERS The overall apnea/hypopnea index (AHI) was 11.4 per hour. There were 33 total apneas, including 10 obstructive, 23 central and 0 mixed apneas. There were 21 hypopneas and 25 RERAs.  The AHI during Stage REM sleep was 28.9 per hour.  AHI while supine was 13.1 per hour.  The mean oxygen saturation was 92.9%. The minimum SpO2 during sleep was 78.0%.  loud snoring was noted during this study.  CARDIAC DATA The 2 lead EKG demonstrated sinus rhythm. The mean heart rate was 68.2 beats  per minute. Other EKG findings include: PVCs.  LEG MOVEMENT DATA The total PLMS were 0 with a resulting PLMS index of 0.0. Associated arousal with leg movement index was 0.0 .  IMPRESSIONS - Mild obstructive sleep apnea occurred during this study (AHI = 11.4/h). - There were insufficient early events to meet protocol requirements for split CPAP titration. - No significant central sleep apnea occurred during this study (CAI = 4.9/h). - Moderate oxygen desaturation was noted during this study (Min O2 = 78.0%). Mean 92.9%. - The patient snored with loud snoring volume. - EKG findings include PVCs. - Clinically significant periodic limb movements did not occur during sleep. No significant associated arousals.  DIAGNOSIS - Obstructive Sleep Apnea (327.23 [G47.33 ICD-10])  RECOMMENDATIONS - CPAP titration study or AutoPAP. Other options would be based on clinical judgment. - Be careful with alcohol, sedatives and other CNS depressants that may worsen sleep apnea and disrupt normal sleep architecture. - Sleep hygiene should be reviewed to assess factors that may improve sleep quality. - Weight management and regular exercise should be initiated or continued if appropriate.  [Electronically signed] 12/30/2017 02:22 PM  Baird Lyons MD, [place], American Board of Sleep Medicine   NPI: [id number]                          [place], [facility]  ELECTRONICALLY SIGNED ON:  12/30/2017, 2:20 PM [place] PH: [phone]   FX: [phone] [place]

## 2017-12-30 NOTE — Telephone Encounter (Signed)
Called and spoke with patient, advised that he has sleep apnea and that we have faxed form to sleep center for cpap supplies. Thanks!

## 2018-01-01 ENCOUNTER — Other Ambulatory Visit: Payer: Self-pay | Admitting: Family Medicine

## 2018-01-01 ENCOUNTER — Other Ambulatory Visit: Payer: Self-pay | Admitting: Cardiovascular Disease

## 2018-01-01 ENCOUNTER — Other Ambulatory Visit: Payer: Self-pay | Admitting: Physician Assistant

## 2018-01-01 DIAGNOSIS — I5032 Chronic diastolic (congestive) heart failure: Secondary | ICD-10-CM

## 2018-01-01 DIAGNOSIS — B20 Human immunodeficiency virus [HIV] disease: Secondary | ICD-10-CM

## 2018-01-01 DIAGNOSIS — G63 Polyneuropathy in diseases classified elsewhere: Principal | ICD-10-CM

## 2018-01-01 NOTE — Telephone Encounter (Signed)
I lmom for pt to call the refill dept back on Monday and confirm his dosage of Spironolactone. Looks like I see the pt should only be on Spironolactone 25 mg daily.

## 2018-01-01 NOTE — Telephone Encounter (Signed)
LMOM for patient to return my call.

## 2018-01-01 NOTE — Telephone Encounter (Signed)
Should this patient be taking spironolactone 12.5 mg or 25 mg qd? Please advise. Thanks, MI

## 2018-01-06 ENCOUNTER — Ambulatory Visit: Payer: Medicaid Other | Admitting: Family Medicine

## 2018-01-06 ENCOUNTER — Other Ambulatory Visit: Payer: Self-pay | Admitting: Cardiovascular Disease

## 2018-01-06 MED ORDER — SPIRONOLACTONE 25 MG PO TABS: 12.5000 mg | ORAL_TABLET | Freq: Every day | ORAL | 4 refills | Status: DC

## 2018-01-12 ENCOUNTER — Telehealth: Payer: Self-pay

## 2018-01-12 NOTE — Telephone Encounter (Signed)
Called and spoke with patient, he states he is having trouble with his cpap machine. I advised him to call [facility] or the sleep center to ask for advise on this. Thanks!

## 2018-01-15 ENCOUNTER — Ambulatory Visit (INDEPENDENT_AMBULATORY_CARE_PROVIDER_SITE_OTHER): Payer: Medicaid Other | Admitting: Family Medicine

## 2018-01-15 ENCOUNTER — Encounter: Payer: Self-pay | Admitting: Family Medicine

## 2018-01-15 VITALS — BP 150/92 | HR 71 | Temp 98.6°F | Resp 16 | Ht 70.0 in | Wt 216.0 lb

## 2018-01-15 DIAGNOSIS — I129 Hypertensive chronic kidney disease with stage 1 through stage 4 chronic kidney disease, or unspecified chronic kidney disease: Secondary | ICD-10-CM | POA: Diagnosis not present

## 2018-01-15 DIAGNOSIS — J301 Allergic rhinitis due to pollen: Secondary | ICD-10-CM

## 2018-01-15 DIAGNOSIS — B20 Human immunodeficiency virus [HIV] disease: Secondary | ICD-10-CM

## 2018-01-15 DIAGNOSIS — R600 Localized edema: Secondary | ICD-10-CM | POA: Diagnosis not present

## 2018-01-15 DIAGNOSIS — I10 Essential (primary) hypertension: Secondary | ICD-10-CM

## 2018-01-15 DIAGNOSIS — N184 Chronic kidney disease, stage 4 (severe): Secondary | ICD-10-CM | POA: Diagnosis not present

## 2018-01-15 LAB — POCT URINALYSIS DIP (DEVICE)
Bilirubin Urine: NEGATIVE
Glucose, UA: NEGATIVE mg/dL
HGB URINE DIPSTICK: NEGATIVE
KETONES UR: NEGATIVE mg/dL
LEUKOCYTES UA: NEGATIVE
Nitrite: NEGATIVE
Protein, ur: >=300 mg/dL — AB
Specific Gravity, Urine: 1.02 (ref 1.005–1.030)
UROBILINOGEN UA: 0.2 mg/dL (ref 0.0–1.0)
pH: 6.5 (ref 5.0–8.0)

## 2018-01-15 MED ORDER — CLONIDINE HCL 0.1 MG PO TABS
0.1000 mg | ORAL_TABLET | Freq: Once | ORAL | Status: AC
  Administered 2018-01-15: 0.1 mg via ORAL

## 2018-01-15 MED ORDER — SPIRONOLACTONE 25 MG PO TABS: 25.0000 mg | ORAL_TABLET | Freq: Every day | ORAL | 5 refills | Status: DC

## 2018-01-15 NOTE — Patient Instructions (Signed)
Will increase Spironolactone to 25 mg daily. Please schedule a follow up with Dr. [name].   Will follow up by phone with any abnormal lab results  Will send a referral to asthma and allergy for further workup and evaluation

## 2018-01-15 NOTE — Progress Notes (Signed)
Subjective:    Albert Eaton is a 52 y.o. male with a history of HIV, stage CKD a who presents for evaluation of bilateral lower extremity edema. The edema has been moderate and localized to lower extremities. Edema has been worsening over the past week.   The swelling has been aggravated by dependency of involved area and has been associated with diagnosis of heart failure and stage 4 chronic kidney disease. Patient has been taking furosemide 40 mg twice daily without sustained relief. He is followed by both cardiology and nephrology. He has been lost to follow up with cardiology. He says that it has been almost 1 year since last evaluation.  Patient is also here for evaluation of possible allergic rhinitis. Patient's symptoms include itchy eyes, itchy nose, nasal congestion and watery eyes. These symptoms are perennial. Current triggers include exposure to pollens, dust and mold. The patient has been suffering from these symptoms for several weeks. Albert Eaton is taking Xyzal 5 mg daily without relief. He is requesting a referral for allergy testing.   Past Medical History:  Diagnosis Date  . Anemia   . Chronic diastolic CHF (congestive heart failure) ([place]) 01/06/2017   Echo 7/16 [facility] in [place], [name]) Mild AI, mild LAE, mild concentric LVH, EF 55, normal wall motion, mild to moderate MR, mild PI, RVSP 55 // Echo 10/09/16 ([facility]):  Moderate LVH, grade 2 diastolic dysfunction, mild MR, moderate LAE   . CKD (chronic kidney disease)    per pt, waiting on a kidney transplant in near future.  . Gout   . History of nuclear stress test    a. Nuc study 7/16: no scar or ischemia, EF 42 // b. Nuc study 12/17: EF 48, ?small apical ischemia, Low Risk  . HIV (human immunodeficiency virus infection) ([place])   . HLD (hyperlipidemia)   . Hypertension   . Hypertensive heart disease with CHF (congestive heart failure) ([place]) 10/09/2016  . OSA (obstructive sleep apnea)   . Post-operative nausea and  vomiting    1 time as a child  . Sleep apnea    uses c-pap   Social History   Socioeconomic History  . Marital status: Single    Spouse name: Not on file  . Number of children: Not on file  . Years of education: Not on file  . Highest education level: Not on file  Occupational History  . Not on file  Social Needs  . Financial resource strain: Not on file  . Food insecurity:    Worry: Not on file    Inability: Not on file  . Transportation needs:    Medical: Not on file    Non-medical: Not on file  Tobacco Use  . Smoking status: Former Smoker    Last attempt to quit: 2000    Years since quitting: 19.2  . Smokeless tobacco: Never Used  Substance and Sexual Activity  . Alcohol use: Yes    Alcohol/week: 1.2 oz    Types: 2 Shots of liquor per week    Comment: Light use  . Drug use: No  . Sexual activity: Never    Partners: Male    Birth control/protection: Condom    Comment: declined condoms  Lifestyle  . Physical activity:    Days per week: Not on file    Minutes per session: Not on file  . Stress: Not on file  Relationships  . Social connections:    Talks on phone: Not on file    Gets  together: Not on file    Attends religious service: Not on file    Active member of club or organization: Not on file    Attends meetings of clubs or organizations: Not on file    Relationship status: Not on file  . Intimate partner violence:    Fear of current or ex partner: Not on file    Emotionally abused: Not on file    Physically abused: Not on file    Forced sexual activity: Not on file  Other Topics Concern  . Not on file  Social History Narrative  . Not on file   Immunization History  Administered Date(s) Administered  . Influenza,inj,Quad PF,6+ Mos 10/10/2016, 08/31/2017  . Meningococcal Mcv4o 10/15/2016, 08/31/2017  . Pneumococcal Polysaccharide-23 10/15/2016  . Tdap 01/27/2017   Review of Systems  Constitutional: Negative.  Negative for malaise/fatigue.   HENT: Negative.  Negative for sore throat.        Post nasal drip  Eyes: Positive for redness.       Eye itching  Respiratory: Positive for cough.   Cardiovascular: Positive for leg swelling. Negative for chest pain.  Gastrointestinal: Negative.   Genitourinary: Negative.  Negative for dysuria, flank pain, frequency, hematuria and urgency.  Musculoskeletal: Negative.  Negative for back pain and joint pain.  Skin: Negative.   Neurological: Negative.  Negative for weakness.  Endo/Heme/Allergies: Negative.   Psychiatric/Behavioral: Negative.    Objective:  Physical Exam  Constitutional: He is well-developed, well-nourished, and in no distress.  HENT:  Head: Normocephalic and atraumatic.  Right Ear: External ear normal.  Left Ear: External ear normal.  Nose: Mucosal edema (pale, boggy) present.  Mouth/Throat: Oropharyngeal exudate present.  Eyes: Pupils are equal, round, and reactive to light.  Neck: Normal range of motion. Neck supple.  Abdominal: Soft. Bowel sounds are normal.  Skin: Skin is warm and dry.  Psychiatric: Mood, memory, affect and judgment normal.    Assessment:  BP (!) 150/92   Pulse 71   Temp 98.6 F (37 C) (Oral)   Resp 16   Ht 5' 10"  (1.778 m)   Wt 216 lb (98 kg)   SpO2 99%   BMI 30.99 kg/m   Plan  1. Bilateral lower extremity edema Will continue Furosemide 40 mg BID.  Will increase spironolactone 25 mg daily.  - spironolactone (ALDACTONE) 25 MG tablet; Take 1 tablet (25 mg total) by mouth daily.  Dispense: 30 tablet; Refill: 5 - Basic Metabolic Panel - POCT urinalysis dip (device)  2. CKD stage 4 secondary to hypertension [facility]) Patient is scheduled to follow up with nephrology.  - Basic Metabolic Panel - cloNIDine (CATAPRES) tablet 0.1 mg - POCT urinalysis dip (device)  3. HIV disease ([place]) Continue following up with infectious disease as scheduled.   4. Allergic rhinitis due to pollen, unspecified seasonality Patient warrants referral to  allergist for further workup and evaluation.  Continue daily antihistamine.  Avoid outdoor activities during times of high pollen index.  - Ambulatory referral to Allergy  5. Accelerated hypertension Administered clonidine 0.1 mg. Blood pressure decreased to 150/92. Discussed the importance of taking medications consistently in order to achieve positive outcomes. Also, patient advised to schedule first available appointment with cardiology.   RTC: 3 months for chronic conditions  [name]  MSN, FNP-C [facility] [address] [place], [place] [address] [phone]

## 2018-01-16 LAB — BASIC METABOLIC PANEL
BUN/Creatinine Ratio: 7 — ABNORMAL LOW (ref 9–20)
BUN: 18 mg/dL (ref 6–24)
CALCIUM: 9.9 mg/dL (ref 8.7–10.2)
CO2: 24 mmol/L (ref 20–29)
Chloride: 104 mmol/L (ref 96–106)
Creatinine, Ser: 2.68 mg/dL — ABNORMAL HIGH (ref 0.76–1.27)
GFR, EST AFRICAN AMERICAN: 30 mL/min/{1.73_m2} — AB (ref >59)
GFR, EST NON AFRICAN AMERICAN: 26 mL/min/{1.73_m2} — AB (ref >59)
Glucose: 90 mg/dL (ref 65–99)
POTASSIUM: 3.6 mmol/L (ref 3.5–5.2)
SODIUM: 143 mmol/L (ref 134–144)

## 2018-01-18 ENCOUNTER — Telehealth: Payer: Self-pay

## 2018-01-18 ENCOUNTER — Telehealth: Payer: Self-pay | Admitting: Cardiovascular Disease

## 2018-01-18 NOTE — Telephone Encounter (Signed)
-----   Message from [name], [place] sent at 01/16/2018  6:55 AM EDT ----- Regarding: lab results Please inform patient that creatinine level and GFR have improved from previous results. I recommend that he schedule follow up with cardiology as discussed during appointment. It is important that patient take medications consistently. Will send order to [facility] to adjust CPAP settings to auto titration.   Follow up in office as scheduled.   [name]  MSN, FNP-C [facility] [address] [place], [place] [address] [phone]

## 2018-01-18 NOTE — Telephone Encounter (Signed)
Called, no answer. Left a message advising patient that creatinine level and GFR have improved from previous result. Advised that he should schedule a follow up with cardiology as discussed during appointment. Reminded that it is importation to take medications consistently and that we are sending over an order for CPAP Setting to be changed to auto titration to [facility]. Asked if any questions to call back to our office. Thanks!

## 2018-01-18 NOTE — Telephone Encounter (Signed)
Left message to call back  

## 2018-01-18 NOTE — Telephone Encounter (Signed)
New Message  Pt would like to know which of his valves is going bad, Please call

## 2018-01-20 NOTE — Progress Notes (Signed)
Cardiology Office Note    Date:  01/21/2018   ID:  Suzzette Righter Tierney, DOB Feb 15, 1966, MRN [id number]  PCP:  [name], FNP  Cardiologist: Dr. [name]  Chief Complaint: 6 months follow up  History of Present Illness:   Albert Eaton is a 52 y.o. male HTN, HLD, chronic diastolic CHF, CKD stage IV and OSA on CPAP presents for 6 months follow up.   He initially presented in December 2017 with hypertensive emergency and elevated troponin after transitioning to [place]. An echocardiogram demonstrated preserved LV function. The patient was managed medically.   He was doing well on cardiac stand point when last seen by Dr. [name] 04/2017.  Here today for follow up. Recently seen by PCP and increase Spironolactone to 25 mg daily for lower extremity edema.  Improvement noted.  He has intermittent lower extremity edema with orthopnea.  No PND, chest pain, palpitations, melena or blood in his stool or urine.  Sometimes he is eating excess salt.  He does not weigh himself every day.   Past Medical History:  Diagnosis Date  . Anemia   . Chronic diastolic CHF (congestive heart failure) ([place]) 01/06/2017   Echo 7/16 [facility] in [place], [name]) Mild AI, mild LAE, mild concentric LVH, EF 55, normal wall motion, mild to moderate MR, mild PI, RVSP 55 // Echo 10/09/16 ([facility]):  Moderate LVH, grade 2 diastolic dysfunction, mild MR, moderate LAE   . CKD (chronic kidney disease)    per pt, waiting on a kidney transplant in near future.  . Gout   . History of nuclear stress test    a. Nuc study 7/16: no scar or ischemia, EF 42 // b. Nuc study 12/17: EF 48, ?small apical ischemia, Low Risk  . HIV (human immunodeficiency virus infection) ([place])   . HLD (hyperlipidemia)   . Hypertension   . Hypertensive heart disease with CHF (congestive heart failure) ([place]) 10/09/2016  . OSA (obstructive sleep apnea)   . Post-operative nausea and vomiting    1 time as a child  . Sleep apnea    uses c-pap    Past Surgical History:  Procedure Laterality Date  . CHOLECYSTECTOMY    . HERNIA REPAIR     As baby  . NOSE SURGERY    . RENAL BIOPSY      Current Medications: Prior to Admission medications   Medication Sig Start Date End Date Taking? Authorizing Provider  abacavir (ZIAGEN) 300 MG tablet Take 1 tablet (300 mg total) by mouth daily. 12/07/17   Campbell Riches, MD  allopurinol (ZYLOPRIM) 100 MG tablet Take 1 tablet (100 mg total) 2 (two) times daily by mouth. 09/04/17   [name], FNP  amLODipine (NORVASC) 10 MG tablet Take 1 tablet (10 mg total) by mouth daily. 10/31/16   [name], FNP  amoxicillin-clavulanate (AUGMENTIN) 875-125 MG tablet Take 1 tablet by mouth every 12 (twelve) hours. 12/02/17   Lawyer, Harrell Gave, PA-C  atorvastatin (LIPITOR) 20 MG tablet Take 2 tablets (40 mg total) by mouth daily. 12/10/17   [name], FNP  carvedilol (COREG) 25 MG tablet TAKE 1 TABLET BY MOUTH TWICE DAILY WITH FOOD 01/01/18   [name], MD  cloNIDine (CATAPRES) 0.1 MG tablet Take 1 tablet (0.1 mg total) by mouth 2 (two) times daily. 01/06/17   Richardson Dopp T, PA-C  famotidine (PEPCID) 20 MG tablet Take 1 tablet (20 mg total) 2 (two) times daily by mouth. 09/04/17   [name],  MD  furosemide (LASIX) 20 MG tablet Take 3 tablets (60 mg total) daily by mouth. Patient taking differently: Take 40 mg by mouth 2 (two) times daily.  09/11/17   Richardson Dopp T, PA-C  gabapentin (NEURONTIN) 300 MG capsule TAKE 1 CAPSULE BY MOUTH THREE TIMES DAILY 01/01/18   [name], FNP  hydrALAZINE (APRESOLINE) 100 MG tablet TAKE 1 TABLET BY MOUTH THREE TIMES DAILY 01/01/18   [name], MD  ISENTRESS 400 MG tablet TAKE 1 TABLET BY MOUTH TWICE DAILY Patient taking differently: TAKE 400 mg TABLET BY MOUTH TWICE DAILY 09/07/17   Campbell Riches, MD  lamivudine (EPIVIR) 100 MG tablet TAKE 1 TABLET BY MOUTH DAILY 12/07/17   Campbell Riches, MD  levocetirizine (XYZAL)  5 MG tablet Take 1 tablet (5 mg total) by mouth every evening. 12/09/17   [name], FNP  levothyroxine (SYNTHROID, LEVOTHROID) 25 MCG tablet Take 1 tablet (25 mcg total) by mouth daily before breakfast. 10/09/17   [name], FNP  ondansetron (ZOFRAN ODT) 4 MG disintegrating tablet 55m ODT q4 hours prn nausea/vomit 06/01/17   BMalvin Johns MD  potassium chloride SA (K-DUR,KLOR-CON) 20 MEQ tablet Take 30 mEq by mouth daily.    [provider]  spironolactone (ALDACTONE) 25 MG tablet Take 1 tablet (25 mg total) by mouth daily. 01/15/18   HDorena Dew FNP  triamcinolone cream (KENALOG) 0.1 % Apply 1 application topically 2 (two) times daily. Patient taking differently: Apply 1 application topically as needed.  07/13/17   HDorena Dew FNP  zinc gluconate 50 MG tablet Take 100 mg by mouth daily.    [provider]    Allergies:   Lisinopril and Ace inhibitors   Social History   Socioeconomic History  . Marital status: Single    Spouse name: Not on file  . Number of children: Not on file  . Years of education: Not on file  . Highest education level: Not on file  Occupational History  . Not on file  Social Needs  . Financial resource strain: Not on file  . Food insecurity:    Worry: Not on file    Inability: Not on file  . Transportation needs:    Medical: Not on file    Non-medical: Not on file  Tobacco Use  . Smoking status: Former Smoker    Last attempt to quit: 2000    Years since quitting: 19.2  . Smokeless tobacco: Never Used  Substance and Sexual Activity  . Alcohol use: Yes    Alcohol/week: 1.2 oz    Types: 2 Shots of liquor per week    Comment: Light use  . Drug use: No  . Sexual activity: Never    Partners: Male    Birth control/protection: Condom    Comment: declined condoms  Lifestyle  . Physical activity:    Days per week: Not on file    Minutes per session: Not on file  . Stress: Not on file  Relationships  . Social  connections:    Talks on phone: Not on file    Gets together: Not on file    Attends religious service: Not on file    Active member of club or organization: Not on file    Attends meetings of clubs or organizations: Not on file    Relationship status: Not on file  Other Topics Concern  . Not on file  Social History Narrative  . Not on file  Family History:  The patient's family history includes Heart attack (age of onset: 31) in his maternal grandmother; Heart failure in his mother; Hypertension in his brother, sister, and sister.   ROS:   Please see the history of present illness.    ROS All other systems reviewed and are negative.   PHYSICAL EXAM:   VS:  BP (!) 138/94   Pulse 64   Ht 5' 10"  (1.778 m)   Wt 218 lb 12.8 oz (99.2 kg)   SpO2 97%   BMI 31.39 kg/m    GEN: Well nourished, well developed, in no acute distress  HEENT: normal  Neck: no JVD, carotid bruits, or masses Cardiac: RRR; 2/6 systolic murmurs, rubs, or gallops,no edema  Respiratory:  clear to auscultation bilaterally, normal work of breathing GI: soft, nontender, nondistended, + BS MS: no deformity or atrophy  Skin: warm and dry, no rash Neuro:  Alert and Oriented x 3, Strength and sensation are intact Psych: euthymic mood, full affect  Wt Readings from Last 3 Encounters:  01/21/18 218 lb 12.8 oz (99.2 kg)  01/15/18 216 lb (98 kg)  12/24/17 210 lb (95.3 kg)      Studies/Labs Reviewed:   EKG:  EKG is not ordered today.    Recent Labs: 06/08/2017: Magnesium 1.6 11/19/2017: TSH 3.360 12/01/2017: ALT 13; Hemoglobin 14.3; Platelets 328 12/09/2017: BNP 76.9 01/15/2018: BUN 18; Creatinine, Ser 2.68; Potassium 3.6; Sodium 143   Lipid Panel    Component Value Date/Time   CHOL 180 12/09/2017 0936   TRIG 166 (H) 12/09/2017 0936   HDL 34 (L) 12/09/2017 0936   CHOLHDL 5.3 (H) 12/09/2017 0936   CHOLHDL 4.4 05/25/2017 0953   VLDL 29 05/25/2017 0953   LDLCALC 113 (H) 12/09/2017 0936    Additional  studies/ records that were reviewed today include:   Echocardiogram: 10/2017 Study Conclusions  - Left ventricle: The cavity size was normal. There was severe   focal basal and moderate concentric hypertrophy. Systolic   function was normal. The estimated ejection fraction was in the   range of 60% to 65%. Wall motion was normal; there were no   regional wall motion abnormalities. Features are consistent with   a pseudonormal left ventricular filling pattern, with concomitant   abnormal relaxation and increased filling pressure (grade 2   diastolic dysfunction). - Aortic valve: There was trivial regurgitation. - Aorta: Aortic root dimension: 39 mm (ED). Ascending aortic   diameter: 40 mm (S). - Aortic root: The aortic root was mildly dilated. - Ascending aorta: The ascending aorta was mildly dilated. - Mitral valve: There was mild to moderate regurgitation directed   eccentrically and posteriorly. - Left atrium: The atrium was severely dilated. - Right ventricle: The cavity size was moderately dilated. Wall   thickness was normal. - Pulmonary arteries: PA peak pressure: 35 mm Hg (S).    ASSESSMENT & PLAN:    1. Chronic diastolic CHF - Euvolemic by exam today. Encouraged salt restriction and daily weight. No change in medication today. Continue lasix 44m BID and Spironolactone 261mqd. Recent echo showed preserved LVEF.   2. Hypertensive heart disease with CHF & CKD - Stable on current mediatation.   3. CKD, stage IV - Followed by Dr. UpHollie SalkPrefer diuretics management by nephrologist.   4. HLD - 05/25/2017: VLDL 29 12/09/2017: Cholesterol, Total 180; HDL 34; LDL Calculated 113; Triglycerides 166  - Continue lipitor 4057m  5. Mitral regurgitation - Mild to moderate by echo 10/2017. Repeat with  serial echo.   6. OSA on CPAP  Medication Adjustments/Labs and Tests Ordered: Current medicines are reviewed at length with the patient today.  Concerns regarding medicines are  outlined above.  Medication changes, Labs and Tests ordered today are listed in the Patient Instructions below. Patient Instructions  Your physician has recommended you make the following change in your medication:  MAY TAKE AN EXTRA FUROSEMIDE IF HAS WEIGHT GAIN OF 3 LB'S OR MORE IN 24 HOUR PERIOD OR NOTES SWELLING TO FEET AND ANKLES   Your physician recommends that you schedule a follow-up appointment in:  [place] DR Larry Sierras [place], [place]  01/21/2018 11:37 AM    [facility] [place], [place], [place]  [address] Phone: [phone]; Fax: [phone]

## 2018-01-21 ENCOUNTER — Encounter: Payer: Self-pay | Admitting: Family Medicine

## 2018-01-21 ENCOUNTER — Encounter: Payer: Self-pay | Admitting: Physician Assistant

## 2018-01-21 ENCOUNTER — Ambulatory Visit (INDEPENDENT_AMBULATORY_CARE_PROVIDER_SITE_OTHER): Payer: Medicaid Other | Admitting: Physician Assistant

## 2018-01-21 VITALS — BP 138/94 | HR 64 | Ht 70.0 in | Wt 218.8 lb

## 2018-01-21 DIAGNOSIS — I129 Hypertensive chronic kidney disease with stage 1 through stage 4 chronic kidney disease, or unspecified chronic kidney disease: Secondary | ICD-10-CM | POA: Diagnosis not present

## 2018-01-21 DIAGNOSIS — N184 Chronic kidney disease, stage 4 (severe): Secondary | ICD-10-CM | POA: Diagnosis not present

## 2018-01-21 DIAGNOSIS — I5032 Chronic diastolic (congestive) heart failure: Secondary | ICD-10-CM

## 2018-01-21 DIAGNOSIS — G4733 Obstructive sleep apnea (adult) (pediatric): Secondary | ICD-10-CM | POA: Diagnosis not present

## 2018-01-21 DIAGNOSIS — I130 Hypertensive heart and chronic kidney disease with heart failure and stage 1 through stage 4 chronic kidney disease, or unspecified chronic kidney disease: Secondary | ICD-10-CM

## 2018-01-21 NOTE — Patient Instructions (Signed)
Your physician has recommended you make the following change in your medication:  MAY TAKE AN EXTRA FUROSEMIDE IF HAS WEIGHT GAIN OF 3 LB'S OR MORE IN 24 HOUR PERIOD OR NOTES SWELLING TO FEET AND ANKLES   Your physician recommends that you schedule a follow-up appointment in:  [place]

## 2018-01-24 ENCOUNTER — Other Ambulatory Visit: Payer: Self-pay | Admitting: Physician Assistant

## 2018-01-25 NOTE — Telephone Encounter (Signed)
Patient had OV 3/28 with Cardiology.

## 2018-01-27 ENCOUNTER — Ambulatory Visit: Payer: Medicaid Other | Admitting: Physician Assistant

## 2018-01-27 ENCOUNTER — Encounter: Payer: Self-pay | Admitting: Physician Assistant

## 2018-01-27 VITALS — BP 146/84 | HR 68 | Ht 70.0 in | Wt 218.0 lb

## 2018-01-27 DIAGNOSIS — K529 Noninfective gastroenteritis and colitis, unspecified: Secondary | ICD-10-CM | POA: Diagnosis not present

## 2018-01-27 DIAGNOSIS — K5900 Constipation, unspecified: Secondary | ICD-10-CM

## 2018-01-27 DIAGNOSIS — R1030 Lower abdominal pain, unspecified: Secondary | ICD-10-CM

## 2018-01-27 MED ORDER — HYOSCYAMINE SULFATE 0.125 MG SL SUBL: 0.1250 mg | SUBLINGUAL_TABLET | SUBLINGUAL | 1 refills | Status: DC | PRN

## 2018-01-27 NOTE — Patient Instructions (Signed)
We have sent the following medications to your pharmacy for you to pick up at your convenience: Hyoscyamine 0.125 mg every 4-6 hours as needed   Start Miralax daily.

## 2018-01-27 NOTE — Progress Notes (Addendum)
Chief Complaint: Abdominal pain  HPI:    Albert Eaton is a 52 year old African-American male, who follows with Dr. Hilarie Fredrickson, with a past medical history of diastolic CHF (echo 01/4314 LVEF 60-65%), end-stage kidney disease awaiting kidney transplant, HIV, OSA and others listed below, who was referred to me by [name], FNP for a complaint of abdominal pain.     12/01/17 CT abdomen and pelvis shows diffuse mesenteric edema at the right lower quadrant, adjacent to the distal ileum with suggestion of ileal wall thickening.  Left-sided perinephric fluid with small left renal cyst.  Aortic atherosclerosis.    12/08/17 colonoscopy with normal ileum, distal rectum and anal verge normal, 2 2-3 mm polyps in the cecum, one 3 mm polyp in the ascending colon, one 10 mm polyp at the hepatic flexure, one 4 mm polyp in the sigmoid colon and small internal hemorrhoids.  Pathology positive for tubular adenomas.  Repeat recommended in 3 years.    01/15/18 BMP elevated creatinine of 2.68.    Today, explains that he has had about 4 episodes of lower abdominal pain over the past year or so.  The first 3 episodes brought him to the ER, he had imaging on 2 of those occasions which showed small bowel/ileal wall thickening, he was treated with antibiotics on first 3 occasions and his symptoms gradually went away within 2-3 days.  Describes last episode about 3 weeks ago which was "a little bit less tender" than the others.  He did not go to the ER and was not given antibiotics and this still went away within 2-3 days.  Describes pain as someone "holding and twisting" his intestines.  Describes this is at least a 6-7/10 when it occurs.  During these episodes there is no change in his stools or hematochezia.    Describes change to constipation over the past couple of weeks.  He has not tried any laxatives for this yet.  Has a bowel movement every 3 days.    Denies fever, chills, weight loss, anorexia, nausea, vomiting or symptoms  that awaken him at night.  Past Medical History:  Diagnosis Date  . Anemia   . Chronic diastolic CHF (congestive heart failure) ([place]) 01/06/2017   Echo 7/16 [facility] in [place], [name]) Mild AI, mild LAE, mild concentric LVH, EF 55, normal wall motion, mild to moderate MR, mild PI, RVSP 55 // Echo 10/09/16 ([facility]):  Moderate LVH, grade 2 diastolic dysfunction, mild MR, moderate LAE   . CKD (chronic kidney disease)    per pt, waiting on a kidney transplant in near future.  . Gout   . History of nuclear stress test    a. Nuc study 7/16: no scar or ischemia, EF 42 // b. Nuc study 12/17: EF 48, ?small apical ischemia, Low Risk  . HIV (human immunodeficiency virus infection) ([place])   . HLD (hyperlipidemia)   . Hypertension   . Hypertensive heart disease with CHF (congestive heart failure) ([place]) 10/09/2016  . OSA (obstructive sleep apnea)   . Post-operative nausea and vomiting    1 time as a child  . Sleep apnea    uses c-pap    Past Surgical History:  Procedure Laterality Date  . CHOLECYSTECTOMY    . HERNIA REPAIR     As baby  . NOSE SURGERY    . RENAL BIOPSY      Current Outpatient Medications  Medication Sig Dispense Refill  . abacavir (ZIAGEN) 300 MG tablet Take 1 tablet (300  mg total) by mouth daily. 30 tablet 4  . allopurinol (ZYLOPRIM) 100 MG tablet Take 1 tablet (100 mg total) 2 (two) times daily by mouth. 60 tablet 5  . amLODipine (NORVASC) 10 MG tablet Take 1 tablet (10 mg total) by mouth daily. 30 tablet 1  . amoxicillin-clavulanate (AUGMENTIN) 875-125 MG tablet Take 1 tablet by mouth every 12 (twelve) hours. 20 tablet 0  . atorvastatin (LIPITOR) 20 MG tablet Take 2 tablets (40 mg total) by mouth daily. 30 tablet 5  . carvedilol (COREG) 25 MG tablet TAKE 1 TABLET BY MOUTH TWICE DAILY WITH FOOD 60 tablet 4  . cloNIDine (CATAPRES) 0.1 MG tablet TAKE 1 TABLET TWICE DAILY 60 tablet 4  . famotidine (PEPCID) 20 MG tablet Take 1 tablet (20 mg total) 2 (two) times daily by  mouth. 60 tablet 9  . furosemide (LASIX) 40 MG tablet Take 40 mg by mouth 2 (two) times daily.    Marland Kitchen gabapentin (NEURONTIN) 300 MG capsule TAKE 1 CAPSULE BY MOUTH THREE TIMES DAILY 90 capsule 0  . hydrALAZINE (APRESOLINE) 100 MG tablet TAKE 1 TABLET BY MOUTH THREE TIMES DAILY 90 tablet 4  . ISENTRESS 400 MG tablet TAKE 1 TABLET BY MOUTH TWICE DAILY 60 tablet 6  . lamivudine (EPIVIR) 100 MG tablet TAKE 1 TABLET BY MOUTH DAILY 90 tablet 0  . levocetirizine (XYZAL) 5 MG tablet Take 1 tablet (5 mg total) by mouth every evening. 30 tablet 11  . levothyroxine (SYNTHROID, LEVOTHROID) 25 MCG tablet Take 1 tablet (25 mcg total) by mouth daily before breakfast. 30 tablet 5  . ondansetron (ZOFRAN ODT) 4 MG disintegrating tablet 26m ODT q4 hours prn nausea/vomit 4 tablet 0  . potassium chloride SA (K-DUR,KLOR-CON) 20 MEQ tablet Take 20 mEq by mouth 2 (two) times daily.     .Marland Kitchenspironolactone (ALDACTONE) 25 MG tablet Take 1 tablet (25 mg total) by mouth daily. 30 tablet 5  . triamcinolone cream (KENALOG) 0.1 % Apply 1 application topically 2 (two) times daily. 30 g 0  . zinc gluconate 50 MG tablet Take 100 mg by mouth daily.     Current Facility-Administered Medications  Medication Dose Route Frequency Provider Last Rate Last Dose  . 0.9 %  sodium chloride infusion  500 mL Intravenous Once Pyrtle, JLajuan Lines MD        Allergies as of 01/27/2018 - Review Complete 01/21/2018  Allergen Reaction Noted  . Lisinopril Cough 03/18/2017  . Ace inhibitors Cough 05/07/2015    Family History  Problem Relation Age of Onset  . Heart failure Mother   . Heart attack Maternal Grandmother 53  . Hypertension Sister   . Hypertension Brother   . Hypertension Sister     Social History   Socioeconomic History  . Marital status: Single    Spouse name: Not on file  . Number of children: Not on file  . Years of education: Not on file  . Highest education level: Not on file  Occupational History  . Not on file  Social  Needs  . Financial resource strain: Not on file  . Food insecurity:    Worry: Not on file    Inability: Not on file  . Transportation needs:    Medical: Not on file    Non-medical: Not on file  Tobacco Use  . Smoking status: Former Smoker    Last attempt to quit: 2000    Years since quitting: 19.2  . Smokeless tobacco: Never Used  Substance and Sexual  Activity  . Alcohol use: Yes    Alcohol/week: 1.2 oz    Types: 2 Shots of liquor per week    Comment: Light use  . Drug use: No  . Sexual activity: Never    Partners: Male    Birth control/protection: Condom    Comment: declined condoms  Lifestyle  . Physical activity:    Days per week: Not on file    Minutes per session: Not on file  . Stress: Not on file  Relationships  . Social connections:    Talks on phone: Not on file    Gets together: Not on file    Attends religious service: Not on file    Active member of club or organization: Not on file    Attends meetings of clubs or organizations: Not on file    Relationship status: Not on file  . Intimate partner violence:    Fear of current or ex partner: Not on file    Emotionally abused: Not on file    Physically abused: Not on file    Forced sexual activity: Not on file  Other Topics Concern  . Not on file  Social History Narrative  . Not on file    Review of Systems:    Constitutional: No weight loss, fever or chills Cardiovascular: No chest pain Respiratory: No SOB  Gastrointestinal: See HPI and otherwise negative   Physical Exam:  Vital signs: BP (!) 146/84   Pulse 68   Ht 5' 10"  (1.778 m)   Wt 218 lb (98.9 kg)   BMI 31.28 kg/m    Constitutional:   Pleasant AA male appears to be in NAD, Well developed, Well nourished, alert and cooperative Head:  Normocephalic and atraumatic. Eyes:   PEERL, EOMI. No icterus. Conjunctiva pink. Ears:  Normal auditory acuity. Neck:  Supple Throat: Oral cavity and pharynx without inflammation, swelling or lesion.    Respiratory: Respirations even and unlabored. Lungs clear to auscultation bilaterally.   No wheezes, crackles, or rhonchi.  Cardiovascular: Normal S1, S2. No MRG. Regular rate and rhythm. No peripheral edema, cyanosis or pallor.  Gastrointestinal:  Soft, nondistended, nontender. No rebound or guarding. Normal bowel sounds. No appreciable masses or hepatomegaly. Rectal:  Not performed.  Msk:  Symmetrical without gross deformities. Without edema, no deformity or joint abnormality.  Neurologic:  Alert and  oriented x4;  grossly normal neurologically.  Skin:   Dry and intact without significant lesions or rashes. Psychiatric: Demonstrates good judgement and reason without abnormal affect or behaviors.  RELEVANT LABS AND IMAGING: CBC    Component Value Date/Time   WBC 9.4 12/01/2017 1441   RBC 4.61 12/01/2017 1441   HGB 14.3 12/01/2017 1441   HCT 40.5 12/01/2017 1441   PLT 328 12/01/2017 1441   MCV 87.9 12/01/2017 1441   MCH 31.0 12/01/2017 1441   MCHC 35.3 12/01/2017 1441   RDW 13.2 12/01/2017 1441   LYMPHSABS 3.1 06/07/2017 0936   MONOABS 1.3 (H) 06/07/2017 0936   EOSABS 0.1 06/07/2017 0936   BASOSABS 0.0 06/07/2017 0936    CMP     Component Value Date/Time   NA 143 01/15/2018 1140   K 3.6 01/15/2018 1140   CL 104 01/15/2018 1140   CO2 24 01/15/2018 1140   GLUCOSE 90 01/15/2018 1140   GLUCOSE 116 (H) 12/01/2017 1441   BUN 18 01/15/2018 1140   CREATININE 2.68 (H) 01/15/2018 1140   CREATININE 3.85 (H) 08/31/2017 0908   CALCIUM 9.9 01/15/2018 1140  PROT 7.8 12/01/2017 1441   ALBUMIN 4.1 12/01/2017 1441   AST 17 12/01/2017 1441   ALT 13 (L) 12/01/2017 1441   ALKPHOS 80 12/01/2017 1441   BILITOT 0.7 12/01/2017 1441   GFRNONAA 26 (L) 01/15/2018 1140   GFRNONAA 17 (L) 08/31/2017 0908   GFRAA 30 (L) 01/15/2018 1140   GFRAA 20 (L) 08/31/2017 0908   CT ABDOMEN AND PELVIS WITHOUT CONTRAST 12/01/17  TECHNIQUE: Multidetector CT imaging of the abdomen and pelvis was  performed following the standard protocol without IV contrast.  COMPARISON:  CT of the abdomen and pelvis from 06/01/2017, and renal ultrasound performed 06/22/2017  FINDINGS: Lower chest: The visualized lung bases are grossly clear. The visualized portions of the mediastinum are unremarkable.  Hepatobiliary: The liver is unremarkable in appearance. The patient is status post cholecystectomy, with clips noted along the gallbladder fossa. The common bile duct remains normal in caliber. Trace ascites is noted tracking about the liver.  Pancreas: The pancreas is within normal limits.  Spleen: The spleen is unremarkable in appearance.  Adrenals/Urinary Tract: The adrenal glands are unremarkable in appearance.  Left-sided perinephric fluid is noted. A small left renal cyst is seen. This could reflect underlying pyelonephritis. No renal or ureteral stones are identified. The right kidney is unremarkable in appearance.  Stomach/Bowel: The stomach is unremarkable in appearance.  Diffuse mesenteric edema is noted at the right lower quadrant, adjacent to the distal ileum, with suggestion of ileal wall thickening. This may reflect an acute infectious or inflammatory ileitis. Trace free fluid is seen tracking along the paracolic gutters and within the pelvis.  The appendix is normal in caliber, without evidence of appendicitis. The colon is unremarkable in appearance.  Vascular/Lymphatic: Scattered calcification is seen along the abdominal aorta and its branches. The abdominal aorta is otherwise grossly unremarkable. The inferior vena cava is grossly unremarkable. No retroperitoneal lymphadenopathy is seen. No pelvic sidewall lymphadenopathy is identified.  Reproductive: The bladder is mildly distended and grossly unremarkable. The prostate remains normal in size, with minimal calcification.  Other: No additional soft tissue abnormalities are  seen.  Musculoskeletal: No acute osseous abnormalities are identified. The visualized musculature is unremarkable in appearance.  IMPRESSION: 1. Diffuse mesenteric edema at the right lower quadrant, adjacent to the distal ileum, with suggestion of ileal wall thickening. This may reflect an infectious or inflammatory ileitis. Trace ascites noted within the abdomen and pelvis. 2. Left-sided perinephric fluid, with small left renal cyst. This could reflect underlying pyelonephritis, or may extend from the ascites.  Aortic Atherosclerosis (ICD10-I70.0).   Electronically Signed   By: Garald Balding M.D.   On: 12/01/2017 23:09  Assessment: 1.  Ileitis: See below, colonoscopy in February with normal ileum 2.  Lower abdominal pain: 4 episodes over the past couple of years, the last to 3 weeks ago resolved on its own without antibiotics, previously signs of inflammation in patients ileum; consider IBD versus IBS versus other 3.  Constipation: Over the past month or so, bowel movement every 3 days  Plan: 1.  Patient and his nephew explained that Dr. Hilarie Fredrickson recommended doing a small bowel capsule endoscopy.  The patient would like to proceed with this now.  Discussed with Dr. Hilarie Fredrickson who recommended scheduling this in the next 1-2 weeks.  We will let Vaughan Basta know to put him on the schedule. 2.  Prescribed Hyoscyamine sulfate 0.125 mg every 4-6 hours #15 with 1 refill.  Discussed with patient that if he has repeat pain he can try this  as there may be an element of spasm/IBS. 3.  Recommend patient start MiraLAX once daily for his constipation. 4.  Patient will follow in clinic per recommendations after capsule endoscopy.  Albert Newer, PA-C [facility] 01/27/2018, 10:01 AM  Cc: [name], FNP   Addendum: Reviewed and agree with initial management. VCE will need to be scheduled in the hospital setting as our capsule equipment is not yet available Pyrtle, Lajuan Lines,  MD

## 2018-01-31 ENCOUNTER — Other Ambulatory Visit: Payer: Self-pay | Admitting: Family Medicine

## 2018-01-31 DIAGNOSIS — B20 Human immunodeficiency virus [HIV] disease: Secondary | ICD-10-CM

## 2018-01-31 DIAGNOSIS — G63 Polyneuropathy in diseases classified elsewhere: Principal | ICD-10-CM

## 2018-02-02 ENCOUNTER — Encounter (HOSPITAL_COMMUNITY): Admission: RE | Payer: Self-pay | Source: Ambulatory Visit

## 2018-02-02 ENCOUNTER — Ambulatory Visit (HOSPITAL_COMMUNITY): Admission: RE | Admit: 2018-02-02 | Payer: Medicaid Other | Source: Ambulatory Visit | Admitting: Internal Medicine

## 2018-02-02 SURGERY — IMAGING PROCEDURE, GI TRACT, INTRALUMINAL, VIA CAPSULE
Anesthesia: LOCAL

## 2018-02-04 ENCOUNTER — Telehealth: Payer: Self-pay

## 2018-02-04 DIAGNOSIS — R935 Abnormal findings on diagnostic imaging of other abdominal regions, including retroperitoneum: Secondary | ICD-10-CM

## 2018-02-04 DIAGNOSIS — R1030 Lower abdominal pain, unspecified: Secondary | ICD-10-CM

## 2018-02-04 NOTE — Telephone Encounter (Signed)
Addendum: Reviewed and agree with initial management. VCE will need to be scheduled in the hospital setting as our capsule equipment is not yet available Pyrtle, Lajuan Lines, MD

## 2018-02-05 NOTE — Telephone Encounter (Signed)
Anderson Malta what is the diagnosis for the capsule endo?

## 2018-02-08 NOTE — Telephone Encounter (Signed)
The pt has been given the instructions via phone and My Chart       CAPSULE ENDOSCOPY PATIENT INSTRUCTION SHEET  Albert Eaton [birth year] [id number]   1. 02/23/18 Seven (7) days prior to capsule endoscopy stop taking iron supplements and carafate.  2. 02/27/18 Two (2) days prior to capsule endoscopy stop taking aspirin or any arthritis drugs.  3. 02/28/18 Day before capsule endoscopy purchase a 238 gram bottle of Miralax from the laxative section of your drug store, and a 32 oz. bottle of Gatorade (no red).    4. 02/28/18 One (1) day prior to capsule endoscopy: a) Stop smoking. b) Eat a regular diet until 12:00 Noon. c) After 12:00 Noon take only the following: Black coffee  Jell-O (no fruit or red Jell-o) Water   Bouillon (chicken or beef) 7-Up   Cranberry Juice Tea   Kool-Aid Popsicle (not red) Sprite   Coke Ginger Ale  Pepsi Mountain Dew Gatorade d) At 6:00 pm the evening before your appointment, drink 7 capfuls (105 grams) of Miralax with 32 oz. Gatorade. Drink 8 oz every 15 minutes until gone. e) Nothing to eat or drink after midnight except medications with a sip of water.  5. 03/01/18 Day of capsule endoscopy: Wear loose two-piece clothing to your exam. No medications for 2 hours prior to your test.   Arrive at [facility] arrive a [employer] A  Wear loose two-piece clothing to your exam. No medications for 2 hours prior to your test.

## 2018-02-08 NOTE — Telephone Encounter (Signed)
03/01/18 8 am [facility] for capsule endo  Left message on machine to call back

## 2018-02-08 NOTE — Telephone Encounter (Signed)
Abnormal CT abdomen (ileal thickening) -thanks-JLL

## 2018-02-09 ENCOUNTER — Ambulatory Visit (INDEPENDENT_AMBULATORY_CARE_PROVIDER_SITE_OTHER): Payer: Medicaid Other | Admitting: Allergy and Immunology

## 2018-02-09 ENCOUNTER — Encounter: Payer: Self-pay | Admitting: Allergy and Immunology

## 2018-02-09 VITALS — BP 128/90 | HR 80 | Temp 97.9°F | Resp 16 | Ht 69.69 in | Wt 223.2 lb

## 2018-02-09 DIAGNOSIS — J310 Chronic rhinitis: Secondary | ICD-10-CM | POA: Diagnosis not present

## 2018-02-09 DIAGNOSIS — J3089 Other allergic rhinitis: Secondary | ICD-10-CM | POA: Insufficient documentation

## 2018-02-09 DIAGNOSIS — R062 Wheezing: Secondary | ICD-10-CM

## 2018-02-09 DIAGNOSIS — J4530 Mild persistent asthma, uncomplicated: Secondary | ICD-10-CM

## 2018-02-09 DIAGNOSIS — J4520 Mild intermittent asthma, uncomplicated: Secondary | ICD-10-CM | POA: Insufficient documentation

## 2018-02-09 DIAGNOSIS — H1013 Acute atopic conjunctivitis, bilateral: Secondary | ICD-10-CM | POA: Diagnosis not present

## 2018-02-09 DIAGNOSIS — H1010 Acute atopic conjunctivitis, unspecified eye: Secondary | ICD-10-CM | POA: Insufficient documentation

## 2018-02-09 MED ORDER — ALBUTEROL SULFATE HFA 108 (90 BASE) MCG/ACT IN AERS: 2.0000 | INHALATION_SPRAY | RESPIRATORY_TRACT | 3 refills | Status: DC | PRN

## 2018-02-09 MED ORDER — IPRATROPIUM BROMIDE 0.06 % NA SOLN: NASAL | 5 refills | Status: DC

## 2018-02-09 MED ORDER — OLOPATADINE HCL 0.7 % OP SOLN: 1.0000 [drp] | Freq: Every day | OPHTHALMIC | 5 refills | Status: DC

## 2018-02-09 NOTE — Assessment & Plan Note (Signed)
Shortness breath/wheezing.  The patients history suggests asthma, however spirometry results today do not meet ATS criteria for that diagnosis.  In addition, he has congestive heart failure which certainly would contribute to the dyspnea.  Prescription has been provided for albuterol HFA, 1 to 2 inhalations every 6 hours if needed.

## 2018-02-09 NOTE — Progress Notes (Signed)
New Patient Note  RE: Albert Eaton MRN: [id number] DOB: 07-31-1966 Date of Office Visit: 02/09/2018  Referring provider: [name], FNP Primary care provider: [name], FNP  Chief Complaint: Allergic Rhinitis  and Wheezing   History of present illness: Albert Eaton is a 52 y.o. male seen today in consultation requested by Cammie Sickle, FNP.  He complains of nasal congestion, rhinorrhea, sneezing, postnasal drainage, nasal pruritus, ocular pruritus, and sinus pressure.  These symptoms occur year around but tend to be more frequent and severe during the springtime, particularly the ocular pruritus.  Fluticasone nasal spray and levocetirizine have failed to adequately alleviate his symptoms.  He admits that he forgot to discontinue levocetirizine and therefore we will not be able to proceed with skin testing today.  Jamine reports that over the past year he has experienced episodes of wheezing, dyspnea, and chest tightness.  He does have congestive heart failure, however he is uncertain if the wheezing and chest tightness are related to this condition.  He is unable to identify any specific triggers for the wheezing, chest tightness, and dyspnea.  Assessment and plan: Chronic rhinitis We were unable to perform skin tests today due to recent administration of antihistamine.   A prescription has been provided for ipratropium 0.06% nasal spray, 2 sprays per nostril 2 or 3 times daily as needed.  Nasal saline spray (i.e., Simply Saline) or nasal saline lavage (i.e., NeilMed) is recommended as needed and prior to medicated nasal sprays.  Further recommendations will be made when he returns for allergy skin testing after having been off of antihistamines for at least 3 days.  Allergic conjunctivitis The patient's history suggests allergic conjunctivitis.  Treatment plan as outlined above for allergic rhinitis.  A prescription has been provided for Pazeo,  one drop per eye daily as needed.  This medication is to be stopped 3 days prior to allergy skin testing.  I have also recommended eye lubricant drops (i.e., Natural Tears) as needed.  Wheezing Shortness breath/wheezing.  The patients history suggests asthma, however spirometry results today do not meet ATS criteria for that diagnosis.  In addition, he has congestive heart failure which certainly would contribute to the dyspnea.  Prescription has been provided for albuterol HFA, 1 to 2 inhalations every 6 hours if needed.   Meds ordered this encounter  Medications  . ipratropium (ATROVENT) 0.06 % nasal spray    Sig: 2 sprays per nostril 2 to 3 times daily as needed    Dispense:  15 mL    Refill:  5  . Olopatadine HCl (PAZEO) 0.7 % SOLN    Sig: Place 1 drop into both eyes daily.    Dispense:  1 Bottle    Refill:  5  . albuterol (PROAIR HFA) 108 (90 Base) MCG/ACT inhaler    Sig: Inhale 2 puffs into the lungs every 4 (four) hours as needed for wheezing or shortness of breath.    Dispense:  1 Inhaler    Refill:  3    Diagnostics: Spirometry: FVC was 3.65 L (87% predicted) and FEV1 was 2.88 L (86% predicted) with an FEV1 ratio of 99%.  There was no postbronchodilator improvement.  This study was performed while the patient was asymptomatic.  Please see scanned spirometry results for details. Allergy skin testing: Not performed due to recent administration of antihistamine.    Physical examination: Blood pressure 128/90, pulse 80, temperature 97.9 F (36.6 C), temperature source Oral, resp. rate 16, height  5' 9.69" (1.77 m), weight 223 lb 3.2 oz (101.2 kg), SpO2 96 %.  General: Alert, interactive, in no acute distress. HEENT: TMs pearly gray, turbinates edematous with thick discharge, post-pharynx moderately erythematous. Neck: Supple without lymphadenopathy. Lungs: Clear to auscultation without wheezing, rhonchi or rales. CV: Normal S1, S2 without murmurs. Abdomen: Nondistended,  nontender. Skin: Warm and dry, without lesions or rashes. Extremities:  No clubbing, cyanosis or edema. Neuro:   Grossly intact.  Review of systems:  Review of systems negative except as noted in HPI / PMHx or noted below: Review of Systems  Constitutional: Negative.   HENT: Negative.   Eyes: Negative.   Respiratory: Negative.   Cardiovascular: Negative.   Gastrointestinal: Negative.   Genitourinary: Negative.   Musculoskeletal: Negative.   Skin: Negative.   Neurological: Negative.   Endo/Heme/Allergies: Negative.   Psychiatric/Behavioral: Negative.     Past medical history:  Past Medical History:  Diagnosis Date  . Anemia   . Chronic diastolic CHF (congestive heart failure) ([place]) 01/06/2017   Echo 7/[facility] in [place], [name]) Mild AI, mild LAE, mild concentric LVH, EF 55, normal wall motion, mild to moderate MR, mild PI, RVSP 55 // Echo 10/09/16 ([facility]):  Moderate LVH, grade 2 diastolic dysfunction, mild MR, moderate LAE   . CKD (chronic kidney disease)    per pt, waiting on a kidney transplant in near future.  . Gout   . History of nuclear stress test    a. Nuc study 7/16: no scar or ischemia, EF 42 // b. Nuc study 12/17: EF 48, ?small apical ischemia, Low Risk  . HIV (human immunodeficiency virus infection) ([place])   . HLD (hyperlipidemia)   . Hypertension   . Hypertensive heart disease with CHF (congestive heart failure) ([place]) 10/09/2016  . OSA (obstructive sleep apnea)   . Post-operative nausea and vomiting    1 time as a child  . Sleep apnea    uses c-pap    Past surgical history:  Past Surgical History:  Procedure Laterality Date  . CHOLECYSTECTOMY    . HERNIA REPAIR     As baby  . NOSE SURGERY    . RENAL BIOPSY      Family history: Family History  Problem Relation Age of Onset  . Heart failure Mother   . Heart attack Maternal Grandmother 53  . Hypertension Sister   . Multiple sclerosis Sister   . Hypertension Brother   . Hypertension Sister    . Scoliosis Other   . Allergic rhinitis Neg Hx   . Angioedema Neg Hx   . Asthma Neg Hx   . Eczema Neg Hx   . Immunodeficiency Neg Hx   . Urticaria Neg Hx     Social history: Social History   Socioeconomic History  . Marital status: Single    Spouse name: Not on file  . Number of children: Not on file  . Years of education: Not on file  . Highest education level: Not on file  Occupational History  . Not on file  Social Needs  . Financial resource strain: Not on file  . Food insecurity:    Worry: Not on file    Inability: Not on file  . Transportation needs:    Medical: Not on file    Non-medical: Not on file  Tobacco Use  . Smoking status: Former Smoker    Types: Cigarettes    Last attempt to quit: 2000    Years since quitting: 19.3  . Smokeless  tobacco: Never Used  Substance and Sexual Activity  . Alcohol use: Yes    Alcohol/week: 1.2 oz    Types: 2 Shots of liquor per week    Comment: Light use  . Drug use: No  . Sexual activity: Never    Partners: Male    Birth control/protection: Condom    Comment: declined condoms  Lifestyle  . Physical activity:    Days per week: Not on file    Minutes per session: Not on file  . Stress: Not on file  Relationships  . Social connections:    Talks on phone: Not on file    Gets together: Not on file    Attends religious service: Not on file    Active member of club or organization: Not on file    Attends meetings of clubs or organizations: Not on file    Relationship status: Not on file  . Intimate partner violence:    Fear of current or ex partner: Not on file    Emotionally abused: Not on file    Physically abused: Not on file    Forced sexual activity: Not on file  Other Topics Concern  . Not on file  Social History Narrative  . Not on file   Environmental History: The patient lives in a 52 year old house with carpeting in the bedroom and central air/heat.  There are no pets in the home.  He is a former  cigarette smoker having quit in 2000.  Allergies as of 02/09/2018      Reactions   Lisinopril Cough   Ace Inhibitors Cough      Medication List        Accurate as of 02/09/18  1:07 PM. Always use your most recent med list.          abacavir 300 MG tablet Commonly known as:  ZIAGEN Take 1 tablet (300 mg total) by mouth daily.   albuterol 108 (90 Base) MCG/ACT inhaler Commonly known as:  PROAIR HFA Inhale 2 puffs into the lungs every 4 (four) hours as needed for wheezing or shortness of breath.   allopurinol 100 MG tablet Commonly known as:  ZYLOPRIM Take 1 tablet (100 mg total) 2 (two) times daily by mouth.   amLODipine 10 MG tablet Commonly known as:  NORVASC Take 1 tablet (10 mg total) by mouth daily.   amoxicillin-clavulanate 875-125 MG tablet Commonly known as:  AUGMENTIN Take 1 tablet by mouth every 12 (twelve) hours.   atorvastatin 20 MG tablet Commonly known as:  LIPITOR Take 2 tablets (40 mg total) by mouth daily.   carvedilol 25 MG tablet Commonly known as:  COREG TAKE 1 TABLET BY MOUTH TWICE DAILY WITH FOOD   cloNIDine 0.1 MG tablet Commonly known as:  CATAPRES TAKE 1 TABLET TWICE DAILY   famotidine 20 MG tablet Commonly known as:  PEPCID Take 1 tablet (20 mg total) 2 (two) times daily by mouth.   furosemide 40 MG tablet Commonly known as:  LASIX Take 40 mg by mouth 2 (two) times daily.   gabapentin 300 MG capsule Commonly known as:  NEURONTIN TAKE 1 CAPSULE BY MOUTH THREE TIMES DAILY   hydrALAZINE 100 MG tablet Commonly known as:  APRESOLINE TAKE 1 TABLET BY MOUTH THREE TIMES DAILY   hyoscyamine 0.125 MG SL tablet Commonly known as:  LEVSIN SL Place 1 tablet (0.125 mg total) under the tongue every 4 (four) hours as needed.   ipratropium 0.06 % nasal spray Commonly known as:  ATROVENT 2 sprays per nostril 2 to 3 times daily as needed   ISENTRESS 400 MG tablet Generic drug:  raltegravir TAKE 1 TABLET BY MOUTH TWICE DAILY   lamivudine  100 MG tablet Commonly known as:  EPIVIR TAKE 1 TABLET BY MOUTH DAILY   levocetirizine 5 MG tablet Commonly known as:  XYZAL Take 1 tablet (5 mg total) by mouth every evening.   levothyroxine 25 MCG tablet Commonly known as:  SYNTHROID, LEVOTHROID Take 1 tablet (25 mcg total) by mouth daily before breakfast.   Olopatadine HCl 0.7 % Soln Commonly known as:  PAZEO Place 1 drop into both eyes daily.   ondansetron 4 MG disintegrating tablet Commonly known as:  ZOFRAN ODT 57m ODT q4 hours prn nausea/vomit   potassium chloride SA 20 MEQ tablet Commonly known as:  K-DUR,KLOR-CON Take 20 mEq by mouth 2 (two) times daily.   spironolactone 25 MG tablet Commonly known as:  ALDACTONE Take 1 tablet (25 mg total) by mouth daily.   triamcinolone cream 0.1 % Commonly known as:  KENALOG Apply 1 application topically 2 (two) times daily.   zinc gluconate 50 MG tablet Take 100 mg by mouth daily.       Known medication allergies: Allergies  Allergen Reactions  . Lisinopril Cough  . Ace Inhibitors Cough    I appreciate the opportunity to take part in [facility]. Please do not hesitate to contact me with questions.  Sincerely,   R. CEdgar Frisk MD

## 2018-02-09 NOTE — Assessment & Plan Note (Signed)
We were unable to perform skin tests today due to recent administration of antihistamine.   A prescription has been provided for ipratropium 0.06% nasal spray, 2 sprays per nostril 2 or 3 times daily as needed.  Nasal saline spray (i.e., Simply Saline) or nasal saline lavage (i.e., NeilMed) is recommended as needed and prior to medicated nasal sprays.  Further recommendations will be made when he returns for allergy skin testing after having been off of antihistamines for at least 3 days.

## 2018-02-09 NOTE — Patient Instructions (Addendum)
Chronic rhinitis We were unable to perform skin tests today due to recent administration of antihistamine.   A prescription has been provided for ipratropium 0.06% nasal spray, 2 sprays per nostril 2 or 3 times daily as needed.  Nasal saline spray (i.e., Simply Saline) or nasal saline lavage (i.e., NeilMed) is recommended as needed and prior to medicated nasal sprays.  Further recommendations will be made when he returns for allergy skin testing after having been off of antihistamines for at least 3 days.  Allergic conjunctivitis The patient's history suggests allergic conjunctivitis.  Treatment plan as outlined above for allergic rhinitis.  A prescription has been provided for Pazeo, one drop per eye daily as needed.  This medication is to be stopped 3 days prior to allergy skin testing.  I have also recommended eye lubricant drops (i.e., Natural Tears) as needed.  Wheezing Shortness breath/wheezing.  The patients history suggests asthma, however spirometry results today do not meet ATS criteria for that diagnosis.  In addition, he has congestive heart failure which certainly would contribute to the dyspnea.  Prescription has been provided for albuterol HFA, 1 to 2 inhalations every 6 hours if needed.   Return for allergy skin testing after having been off of oral antihistamines and allergy eyedrops for at least 3 days.

## 2018-02-09 NOTE — Assessment & Plan Note (Signed)
The patient's history suggests allergic conjunctivitis.  Treatment plan as outlined above for allergic rhinitis.  A prescription has been provided for Pazeo, one drop per eye daily as needed.  This medication is to be stopped 3 days prior to allergy skin testing.  I have also recommended eye lubricant drops (i.e., Natural Tears) as needed.

## 2018-02-17 ENCOUNTER — Other Ambulatory Visit (HOSPITAL_COMMUNITY)
Admission: RE | Admit: 2018-02-17 | Discharge: 2018-02-17 | Disposition: A | Discharge status: Home / Self Care | Payer: Medicaid Other | Source: Ambulatory Visit | Attending: Infectious Diseases | Admitting: Infectious Diseases

## 2018-02-17 ENCOUNTER — Other Ambulatory Visit: Payer: Medicaid Other

## 2018-02-17 DIAGNOSIS — Z79899 Other long term (current) drug therapy: Secondary | ICD-10-CM

## 2018-02-17 DIAGNOSIS — B20 Human immunodeficiency virus [HIV] disease: Secondary | ICD-10-CM

## 2018-02-17 DIAGNOSIS — Z113 Encounter for screening for infections with a predominantly sexual mode of transmission: Secondary | ICD-10-CM | POA: Insufficient documentation

## 2018-02-17 LAB — LIPID PANEL
CHOL/HDL RATIO: 3.6 (calc) (ref <5.0)
CHOLESTEROL: 127 mg/dL (ref <200)
HDL: 35 mg/dL — ABNORMAL LOW (ref >40)
LDL CHOLESTEROL (CALC): 73 mg/dL
Non-HDL Cholesterol (Calc): 92 mg/dL (calc) (ref <130)
TRIGLYCERIDES: 102 mg/dL (ref <150)

## 2018-02-17 LAB — CBC
HCT: 33.3 % — ABNORMAL LOW (ref 38.5–50.0)
HEMOGLOBIN: 11.5 g/dL — AB (ref 13.2–17.1)
MCH: 30.4 pg (ref 27.0–33.0)
MCHC: 34.5 g/dL (ref 32.0–36.0)
MCV: 88.1 fL (ref 80.0–100.0)
MPV: 10.8 fL (ref 7.5–12.5)
Platelets: 251 10*3/uL (ref 140–400)
RBC: 3.78 10*6/uL — ABNORMAL LOW (ref 4.20–5.80)
RDW: 14.4 % (ref 11.0–15.0)
WBC: 5.3 10*3/uL (ref 3.8–10.8)

## 2018-02-17 LAB — COMPREHENSIVE METABOLIC PANEL
AG Ratio: 1.5 (calc) (ref 1.0–2.5)
ALBUMIN MSPROF: 4.3 g/dL (ref 3.6–5.1)
ALT: 11 U/L (ref 9–46)
AST: 13 U/L (ref 10–35)
Alkaline phosphatase (APISO): 72 U/L (ref 40–115)
BILIRUBIN TOTAL: 0.3 mg/dL (ref 0.2–1.2)
BUN/Creatinine Ratio: 13 (calc) (ref 6–22)
BUN: 44 mg/dL — AB (ref 7–25)
CO2: 23 mmol/L (ref 20–32)
Calcium: 9.6 mg/dL (ref 8.6–10.3)
Chloride: 108 mmol/L (ref 98–110)
Creat: 3.43 mg/dL — ABNORMAL HIGH (ref 0.70–1.33)
Globulin: 2.8 g/dL (calc) (ref 1.9–3.7)
Glucose, Bld: 110 mg/dL — ABNORMAL HIGH (ref 65–99)
POTASSIUM: 4.1 mmol/L (ref 3.5–5.3)
Sodium: 140 mmol/L (ref 135–146)
Total Protein: 7.1 g/dL (ref 6.1–8.1)

## 2018-02-18 LAB — FLUORESCENT TREPONEMAL AB(FTA)-IGG-BLD: FLUORESCENT TREPONEMAL ABS: NONREACTIVE

## 2018-02-18 LAB — T-HELPER CELL (CD4) - (RCID CLINIC ONLY)
CD4 T CELL HELPER: 31 % — AB (ref 33–55)
CD4 T Cell Abs: 670 /uL (ref 400–2700)

## 2018-02-18 LAB — RPR TITER

## 2018-02-18 LAB — RPR: RPR Ser Ql: REACTIVE — AB

## 2018-02-18 LAB — URINE CYTOLOGY ANCILLARY ONLY
Chlamydia: NEGATIVE
Neisseria Gonorrhea: NEGATIVE

## 2018-02-19 LAB — HIV-1 RNA QUANT-NO REFLEX-BLD
HIV 1 RNA QUANT: NOT DETECTED {copies}/mL
HIV-1 RNA Quant, Log: <1.3 Log copies/mL

## 2018-02-26 ENCOUNTER — Other Ambulatory Visit: Payer: Self-pay | Admitting: Family Medicine

## 2018-02-26 DIAGNOSIS — B20 Human immunodeficiency virus [HIV] disease: Secondary | ICD-10-CM

## 2018-02-26 DIAGNOSIS — G63 Polyneuropathy in diseases classified elsewhere: Principal | ICD-10-CM

## 2018-03-01 ENCOUNTER — Encounter (HOSPITAL_COMMUNITY)
Admission: RE | Disposition: A | Discharge status: Home / Self Care | Payer: Self-pay | Source: Ambulatory Visit | Attending: Internal Medicine

## 2018-03-01 ENCOUNTER — Ambulatory Visit (HOSPITAL_COMMUNITY)
Admission: RE | Admit: 2018-03-01 | Discharge: 2018-03-01 | Disposition: A | Discharge status: Home / Self Care | Payer: Medicaid Other | Source: Ambulatory Visit | Attending: Internal Medicine | Admitting: Internal Medicine

## 2018-03-01 ENCOUNTER — Encounter: Payer: Self-pay | Admitting: Internal Medicine

## 2018-03-01 DIAGNOSIS — K2210 Ulcer of esophagus without bleeding: Secondary | ICD-10-CM | POA: Insufficient documentation

## 2018-03-01 DIAGNOSIS — K2980 Duodenitis without bleeding: Secondary | ICD-10-CM | POA: Diagnosis not present

## 2018-03-01 DIAGNOSIS — R933 Abnormal findings on diagnostic imaging of other parts of digestive tract: Secondary | ICD-10-CM

## 2018-03-01 DIAGNOSIS — R109 Unspecified abdominal pain: Secondary | ICD-10-CM | POA: Diagnosis present

## 2018-03-01 HISTORY — PX: GIVENS CAPSULE STUDY: SHX5432

## 2018-03-01 SURGERY — IMAGING PROCEDURE, GI TRACT, INTRALUMINAL, VIA CAPSULE
Anesthesia: LOCAL

## 2018-03-01 SURGICAL SUPPLY — 1 items: TOWEL COTTON PACK 4EA (MISCELLANEOUS) ×4 IMPLANT

## 2018-03-02 ENCOUNTER — Encounter (HOSPITAL_COMMUNITY): Payer: Self-pay | Admitting: Internal Medicine

## 2018-03-03 ENCOUNTER — Ambulatory Visit (INDEPENDENT_AMBULATORY_CARE_PROVIDER_SITE_OTHER): Payer: Medicaid Other | Admitting: Infectious Diseases

## 2018-03-03 ENCOUNTER — Encounter: Payer: Self-pay | Admitting: Infectious Diseases

## 2018-03-03 VITALS — BP 131/86 | HR 64 | Temp 98.0°F | Ht 70.0 in | Wt 223.4 lb

## 2018-03-03 DIAGNOSIS — Z113 Encounter for screening for infections with a predominantly sexual mode of transmission: Secondary | ICD-10-CM | POA: Diagnosis not present

## 2018-03-03 DIAGNOSIS — Z79899 Other long term (current) drug therapy: Secondary | ICD-10-CM

## 2018-03-03 DIAGNOSIS — G4733 Obstructive sleep apnea (adult) (pediatric): Secondary | ICD-10-CM | POA: Diagnosis not present

## 2018-03-03 DIAGNOSIS — I129 Hypertensive chronic kidney disease with stage 1 through stage 4 chronic kidney disease, or unspecified chronic kidney disease: Secondary | ICD-10-CM | POA: Diagnosis not present

## 2018-03-03 DIAGNOSIS — B20 Human immunodeficiency virus [HIV] disease: Secondary | ICD-10-CM

## 2018-03-03 DIAGNOSIS — N184 Chronic kidney disease, stage 4 (severe): Secondary | ICD-10-CM | POA: Diagnosis not present

## 2018-03-03 NOTE — Progress Notes (Signed)
   Subjective:    Patient ID: Albert Eaton, male    DOB: 11/14/1965, 52 y.o.   MRN: [id number]  HPI 52 y.o.Mwith a history of CHF (nonischemic non-dilated CMwith NYHA 2/C HF preserved EF 55% with moderate concentric hypertrophy and a grade 2 diastolic dysfunction. Moderate mitral valve insufficiency), CKD stage III-IV, HTN, HLD, OSA, neuropathy, and HIV (dx 1991). He had kidney bx 9-18 showing focal nephrosclerosis, interstitial fibrosis. Had fevers afterwards. Taking Isentress, Epivir, abacavir.   He was in ED 11-2017 with GI discomfort. He completed capsule endo this week.   Was seen by ID at [facility] for renal txp. He got on list last week.  Has o/w been feeling well.   HIV 1 RNA Quant (copies/mL)  Date Value  02/17/2018 <20 NOT DETECTED  08/17/2017 <20 NOT DETECTED  05/25/2017 <20 DETECTED (A)   CD4 T Cell Abs (/uL)  Date Value  02/17/2018 670  08/17/2017 610  05/25/2017 690     Review of Systems  Constitutional: Negative for appetite change and unexpected weight change.  Gastrointestinal: Positive for constipation.  Genitourinary: Negative for difficulty urinating.  Psychiatric/Behavioral: Negative for suicidal ideas.  now on CPAP BM qod.  Please see HPI. All other systems reviewed and negative.     Objective:   Physical Exam  Constitutional: He appears well-developed and well-nourished.  Eyes: Pupils are equal, round, and reactive to light. EOM are normal.  Neck: Normal range of motion. Neck supple.  Cardiovascular: Normal rate, regular rhythm and normal heart sounds.  Pulmonary/Chest: Effort normal and breath sounds normal.  Abdominal: Soft. Bowel sounds are normal. There is no tenderness.  Musculoskeletal: Normal range of motion. He exhibits no edema.  Skin: Skin is warm and dry.  Psychiatric: He has a normal mood and affect.          Assessment & Plan:

## 2018-03-03 NOTE — Assessment & Plan Note (Signed)
Lab Results  Component Value Date   CREATININE 3.43 (H) 02/17/2018   CREATININE 2.68 (H) 01/15/2018   CREATININE 3.05 (H) 12/09/2017   Cr slightly worse Appreciate eval at [facility].  Hopefully txp soon.

## 2018-03-03 NOTE — Assessment & Plan Note (Signed)
He will continue on CPAP per his PCP.

## 2018-03-03 NOTE — Assessment & Plan Note (Signed)
He is doing well Appreciate Dr Virgina Norfolk eval.  He does not want to switch his ART at this time.  Offered/refused condoms.  Will see him back in 9 months with labs prior

## 2018-03-09 ENCOUNTER — Ambulatory Visit (INDEPENDENT_AMBULATORY_CARE_PROVIDER_SITE_OTHER): Payer: Medicaid Other | Admitting: Allergy and Immunology

## 2018-03-09 ENCOUNTER — Encounter: Payer: Self-pay | Admitting: Allergy and Immunology

## 2018-03-09 VITALS — BP 132/92 | HR 67 | Temp 97.7°F | Resp 16

## 2018-03-09 DIAGNOSIS — H1013 Acute atopic conjunctivitis, bilateral: Secondary | ICD-10-CM

## 2018-03-09 DIAGNOSIS — J3089 Other allergic rhinitis: Secondary | ICD-10-CM

## 2018-03-09 DIAGNOSIS — R062 Wheezing: Secondary | ICD-10-CM

## 2018-03-09 DIAGNOSIS — R933 Abnormal findings on diagnostic imaging of other parts of digestive tract: Secondary | ICD-10-CM

## 2018-03-09 MED ORDER — AZELASTINE HCL 0.1 % NA SOLN: 2.0000 | Freq: Two times a day (BID) | NASAL | 5 refills | Status: DC | PRN

## 2018-03-09 NOTE — Assessment & Plan Note (Signed)
   Treatment plan as outlined above for allergic rhinitis.  Continue olopatadine eyedrops, 1 drop per eye daily if needed.  I have also recommended eye lubricant drops (i.e., Natural Tears) as needed.

## 2018-03-09 NOTE — Progress Notes (Signed)
Follow-up Note  RE: Albert Eaton MRN: [id number] DOB: [birth year] Date of Office Visit: 03/09/2018  Primary care provider: [name], FNP Referring provider: [name], FNP  History of present illness: Albert Eaton is a 52 y.o. male with chronic rhinoconjunctivitis and a complex medical history presenting today for follow-up and allergy skin testing.  He was previously seen in this clinic for his initial evaluation on February 09, 2018, however we were unable to proceed with skin testing at that time due to recent administration of antihistamine. He complains of nasal congestion, rhinorrhea, sneezing, postnasal drainage, nasal pruritus, ocular pruritus, and sinus pressure.  These symptoms occur year around but tend to be more frequent and severe during the springtime, particularly the ocular pruritus.  Fluticasone nasal spray and levocetirizine have failed to adequately alleviate his symptoms.  He also has a history of chest tightness and dyspnea, however he is uncertain if this is due to congestive heart failure or asthma.   Assessment and plan: Perennial allergic rhinitis with a predominant nonallergic component Allergy skin testing revealed mild reactivity to dust mite antigen.  Otherwise, all epicutaneous and intradermal skin tests were negative despite a positive histamine control.  Aeroallergen avoidance measures have been discussed and provided in written form.  A prescription has been provided for azelastine nasal spray, 1-2 sprays per nostril 2 times daily as needed. Proper nasal spray technique has been discussed and demonstrated.   If needed, may add ipratropium nasal spray.  Nasal saline spray (i.e., Simply Saline) or nasal saline lavage (i.e., NeilMed) is recommended as needed and prior to medicated nasal sprays.  Allergic conjunctivitis  Treatment plan as outlined above for allergic rhinitis.  Continue olopatadine eyedrops, 1 drop per eye daily if  needed.  I have also recommended eye lubricant drops (i.e., Natural Tears) as needed.  Wheezing It is unclear if the patient's symptoms are related to congestive heart failure or bronchoconstriction.  Spirometry today revealed normal pulmonary function while asymptomatic.  For now, continue albuterol HFA, 1 to 2 inhalations every 4-6 hours if needed.  Subjective and objective measures of pulmonary function will be followed and the treatment plan will be adjusted accordingly.   Meds ordered this encounter  Medications  . azelastine (ASTELIN) 0.1 % nasal spray    Sig: Place 2 sprays into both nostrils 2 (two) times daily as needed for rhinitis. Use in each nostril as directed    Dispense:  30 mL    Refill:  5    Diagnostics: Spirometry:  Normal with an FEV1 of 98% predicted and an FEV1 ratio of 99%.  Please see scanned spirometry results for details. Epicutaneous testing: Negative despite a positive histamine control. Intradermal testing: Borderline positive to dust mite antigen.     Physical examination: Blood pressure (!) 132/92, pulse 67, temperature 97.7 F (36.5 C), temperature source Oral, resp. rate 16, SpO2 95 %.  General: Alert, interactive, in no acute distress. HEENT: TMs pearly gray, turbinates edematous with clear discharge, post-pharynx moderately erythematous. Neck: Supple without lymphadenopathy. Lungs: Clear to auscultation without wheezing, rhonchi or rales. CV: Normal S1, S2 without murmurs. Skin: Warm and dry, without lesions or rashes.  The following portions of the patient's history were reviewed and updated as appropriate: allergies, current medications, past family history, past medical history, past social history, past surgical history and problem list.  Allergies as of 03/09/2018      Reactions   Lisinopril Cough   Ace Inhibitors Cough  Medication List        Accurate as of 03/09/18  2:40 PM. Always use your most recent med list.           abacavir 300 MG tablet Commonly known as:  ZIAGEN Take 1 tablet (300 mg total) by mouth daily.   albuterol 108 (90 Base) MCG/ACT inhaler Commonly known as:  PROAIR HFA Inhale 2 puffs into the lungs every 4 (four) hours as needed for wheezing or shortness of breath.   allopurinol 100 MG tablet Commonly known as:  ZYLOPRIM Take 1 tablet (100 mg total) 2 (two) times daily by mouth.   amLODipine 10 MG tablet Commonly known as:  NORVASC Take 1 tablet (10 mg total) by mouth daily.   atorvastatin 20 MG tablet Commonly known as:  LIPITOR Take 2 tablets (40 mg total) by mouth daily.   azelastine 0.1 % nasal spray Commonly known as:  ASTELIN Place 2 sprays into both nostrils 2 (two) times daily as needed for rhinitis. Use in each nostril as directed   carvedilol 25 MG tablet Commonly known as:  COREG TAKE 1 TABLET BY MOUTH TWICE DAILY WITH FOOD   cloNIDine 0.1 MG tablet Commonly known as:  CATAPRES TAKE 1 TABLET TWICE DAILY   famotidine 20 MG tablet Commonly known as:  PEPCID Take 1 tablet (20 mg total) 2 (two) times daily by mouth.   furosemide 40 MG tablet Commonly known as:  LASIX Take 40 mg by mouth 2 (two) times daily.   gabapentin 300 MG capsule Commonly known as:  NEURONTIN TAKE 1 CAPSULE BY MOUTH THREE TIMES DAILY   hydrALAZINE 100 MG tablet Commonly known as:  APRESOLINE TAKE 1 TABLET BY MOUTH THREE TIMES DAILY   hyoscyamine 0.125 MG SL tablet Commonly known as:  LEVSIN SL Place 1 tablet (0.125 mg total) under the tongue every 4 (four) hours as needed.   ipratropium 0.06 % nasal spray Commonly known as:  ATROVENT 2 sprays per nostril 2 to 3 times daily as needed   ISENTRESS 400 MG tablet Generic drug:  raltegravir TAKE 1 TABLET BY MOUTH TWICE DAILY   lamivudine 100 MG tablet Commonly known as:  EPIVIR TAKE 1 TABLET BY MOUTH DAILY   levocetirizine 5 MG tablet Commonly known as:  XYZAL Take 1 tablet (5 mg total) by mouth every evening.    levothyroxine 25 MCG tablet Commonly known as:  SYNTHROID, LEVOTHROID Take 1 tablet (25 mcg total) by mouth daily before breakfast.   Olopatadine HCl 0.7 % Soln Commonly known as:  PAZEO Place 1 drop into both eyes daily.   ondansetron 4 MG disintegrating tablet Commonly known as:  ZOFRAN ODT 24m ODT q4 hours prn nausea/vomit   potassium chloride SA 20 MEQ tablet Commonly known as:  K-DUR,KLOR-CON Take 20 mEq by mouth 2 (two) times daily.   spironolactone 25 MG tablet Commonly known as:  ALDACTONE Take 1 tablet (25 mg total) by mouth daily.   triamcinolone cream 0.1 % Commonly known as:  KENALOG Apply 1 application topically 2 (two) times daily.   zinc gluconate 50 MG tablet Take 100 mg by mouth daily.       Allergies  Allergen Reactions  . Lisinopril Cough  . Ace Inhibitors Cough   Review of systems: Review of systems negative except as noted in HPI / PMHx or noted below: Constitutional: Negative.  HENT: Negative.   Eyes: Negative.  Respiratory: Negative.   Cardiovascular: Negative.  Gastrointestinal: Negative.  Genitourinary: Negative.  Musculoskeletal: Negative.  Neurological: Negative.  Endo/Heme/Allergies: Negative.  Cutaneous: Negative.  Past Medical History:  Diagnosis Date  . Anemia   . Chronic diastolic CHF (congestive heart failure) ([place]) 01/06/2017   Echo 7/16 [facility] in [place], [name]) Mild AI, mild LAE, mild concentric LVH, EF 55, normal wall motion, mild to moderate MR, mild PI, RVSP 55 // Echo 10/09/16 ([facility]):  Moderate LVH, grade 2 diastolic dysfunction, mild MR, moderate LAE   . CKD (chronic kidney disease)    per pt, waiting on a kidney transplant in near future.  . Gout   . History of nuclear stress test    a. Nuc study 7/16: no scar or ischemia, EF 42 // b. Nuc study 12/17: EF 48, ?small apical ischemia, Low Risk  . HIV (human immunodeficiency virus infection) ([place])   . HLD (hyperlipidemia)   . Hypertension   . Hypertensive  heart disease with CHF (congestive heart failure) ([place]) 10/09/2016  . OSA (obstructive sleep apnea)   . Post-operative nausea and vomiting    1 time as a child  . Sleep apnea    uses c-pap    Family History  Problem Relation Age of Onset  . Heart failure Mother   . Heart attack Maternal Grandmother 53  . Hypertension Sister   . Multiple sclerosis Sister   . Hypertension Brother   . Hypertension Sister   . Scoliosis Other   . Allergic rhinitis Neg Hx   . Angioedema Neg Hx   . Asthma Neg Hx   . Eczema Neg Hx   . Immunodeficiency Neg Hx   . Urticaria Neg Hx     Social History   Socioeconomic History  . Marital status: Single    Spouse name: Not on file  . Number of children: Not on file  . Years of education: Not on file  . Highest education level: Not on file  Occupational History  . Not on file  Social Needs  . Financial resource strain: Not on file  . Food insecurity:    Worry: Not on file    Inability: Not on file  . Transportation needs:    Medical: Not on file    Non-medical: Not on file  Tobacco Use  . Smoking status: Former Smoker    Types: Cigarettes    Last attempt to quit: 2000    Years since quitting: 19.3  . Smokeless tobacco: Never Used  Substance and Sexual Activity  . Alcohol use: Yes    Alcohol/week: 1.2 oz    Types: 2 Shots of liquor per week    Comment: Light use  . Drug use: No  . Sexual activity: Never    Partners: Male    Birth control/protection: Condom    Comment: declined condoms  Lifestyle  . Physical activity:    Days per week: Not on file    Minutes per session: Not on file  . Stress: Not on file  Relationships  . Social connections:    Talks on phone: Not on file    Gets together: Not on file    Attends religious service: Not on file    Active member of club or organization: Not on file    Attends meetings of clubs or organizations: Not on file    Relationship status: Not on file  . Intimate partner violence:    Fear  of current or ex partner: Not on file    Emotionally abused: Not on file    Physically abused: Not on file  Forced sexual activity: Not on file  Other Topics Concern  . Not on file  Social History Narrative  . Not on file    I appreciate the opportunity to take part in [place] care. Please do not hesitate to contact me with questions.  Sincerely,   R. Edgar Frisk, MD

## 2018-03-09 NOTE — Assessment & Plan Note (Signed)
Allergy skin testing revealed mild reactivity to dust mite antigen.  Otherwise, all epicutaneous and intradermal skin tests were negative despite a positive histamine control.  Aeroallergen avoidance measures have been discussed and provided in written form.  A prescription has been provided for azelastine nasal spray, 1-2 sprays per nostril 2 times daily as needed. Proper nasal spray technique has been discussed and demonstrated.   If needed, may add ipratropium nasal spray.  Nasal saline spray (i.e., Simply Saline) or nasal saline lavage (i.e., NeilMed) is recommended as needed and prior to medicated nasal sprays.

## 2018-03-09 NOTE — Patient Instructions (Addendum)
Perennial allergic rhinitis with a predominant nonallergic component Allergy skin testing revealed mild reactivity to dust mite antigen.  Otherwise, all epicutaneous and intradermal skin tests were negative despite a positive histamine control.  Aeroallergen avoidance measures have been discussed and provided in written form.  A prescription has been provided for azelastine nasal spray, 1-2 sprays per nostril 2 times daily as needed. Proper nasal spray technique has been discussed and demonstrated.   If needed, may add ipratropium nasal spray.  Nasal saline spray (i.e., Simply Saline) or nasal saline lavage (i.e., NeilMed) is recommended as needed and prior to medicated nasal sprays.  Allergic conjunctivitis  Treatment plan as outlined above for allergic rhinitis.  Continue olopatadine eyedrops, 1 drop per eye daily if needed.  I have also recommended eye lubricant drops (i.e., Natural Tears) as needed.  Wheezing It is unclear if the patient's symptoms are related to congestive heart failure or bronchoconstriction.  Spirometry today revealed normal pulmonary function while asymptomatic.  For now, continue albuterol HFA, 1 to 2 inhalations every 4-6 hours if needed.  Subjective and objective measures of pulmonary function will be followed and the treatment plan will be adjusted accordingly.   Return in about 6 months (around 09/09/2018), or if symptoms worsen or fail to improve.  Control of House Dust Mite Allergen  House dust mites play a major role in allergic asthma and rhinitis.  They occur in environments with high humidity wherever human skin, the food for dust mites is found. High levels have been detected in dust obtained from mattresses, pillows, carpets, upholstered furniture, bed covers, clothes and soft toys.  The principal allergen of the house dust mite is found in its feces.  A gram of dust may contain 1,000 mites and 250,000 fecal particles.  Mite antigen is easily  measured in the air during house cleaning activities.    1. Encase mattresses, including the box spring, and pillow, in an air tight cover.  Seal the zipper end of the encased mattresses with wide adhesive tape. 2. Wash the bedding in water of 130 degrees Farenheit weekly.  Avoid cotton comforters/quilts and flannel bedding: the most ideal bed covering is the dacron comforter. 3. Remove all upholstered furniture from the bedroom. 4. Remove carpets, carpet padding, rugs, and non-washable window drapes from the bedroom.  Wash drapes weekly or use plastic window coverings. 5. Remove all non-washable stuffed toys from the bedroom.  Wash stuffed toys weekly. 6. Have the room cleaned frequently with a vacuum cleaner and a damp dust-mop.  The patient should not be in a room which is being cleaned and should wait 1 hour after cleaning before going into the room. 7. Close and seal all heating outlets in the bedroom.  Otherwise, the room will become filled with dust-laden air.  An electric heater can be used to heat the room. 8. Reduce indoor humidity to less than 50%.  Do not use a humidifier.

## 2018-03-09 NOTE — Assessment & Plan Note (Signed)
It is unclear if the patient's symptoms are related to congestive heart failure or bronchoconstriction.  Spirometry today revealed normal pulmonary function while asymptomatic.  For now, continue albuterol HFA, 1 to 2 inhalations every 4-6 hours if needed.  Subjective and objective measures of pulmonary function will be followed and the treatment plan will be adjusted accordingly.

## 2018-03-10 ENCOUNTER — Telehealth: Payer: Self-pay

## 2018-03-10 NOTE — Telephone Encounter (Signed)
Left message for pt to call back.  Spoke with pt and he is aware. States at this point he is doing good. He knows to call back if he starts having issues again.

## 2018-03-10 NOTE — Telephone Encounter (Signed)
-----   Message from Jerene Bears, MD sent at 03/09/2018  3:32 PM EDT ----- Regarding: VCE Taleeya Blondin Please contact Mr. Goguen and let him know that his VCE was normal. There was no evidence of inflammation in the small bowel as suggested by previous CT scan. If he is still have GI symptoms/issues, I think he should come back to see me in clinic Thanks JMP

## 2018-03-14 ENCOUNTER — Other Ambulatory Visit: Payer: Self-pay | Admitting: Infectious Diseases

## 2018-03-15 ENCOUNTER — Encounter: Payer: Self-pay | Admitting: Infectious Diseases

## 2018-03-15 ENCOUNTER — Encounter: Payer: Self-pay | Admitting: Family Medicine

## 2018-03-16 ENCOUNTER — Other Ambulatory Visit: Payer: Self-pay | Admitting: Behavioral Health

## 2018-03-16 DIAGNOSIS — B20 Human immunodeficiency virus [HIV] disease: Secondary | ICD-10-CM

## 2018-03-16 MED ORDER — LAMIVUDINE 100 MG PO TABS: 100.0000 mg | ORAL_TABLET | Freq: Every day | ORAL | 2 refills | Status: DC

## 2018-03-16 MED ORDER — ABACAVIR SULFATE 300 MG PO TABS: 300.0000 mg | ORAL_TABLET | Freq: Every day | ORAL | 8 refills | Status: DC

## 2018-03-16 MED ORDER — RALTEGRAVIR POTASSIUM 400 MG PO TABS: 400.0000 mg | ORAL_TABLET | Freq: Two times a day (BID) | ORAL | 8 refills | Status: DC

## 2018-03-17 NOTE — Telephone Encounter (Signed)
[place] please advise.

## 2018-03-21 ENCOUNTER — Other Ambulatory Visit: Payer: Self-pay | Admitting: Family Medicine

## 2018-03-21 DIAGNOSIS — M109 Gout, unspecified: Secondary | ICD-10-CM

## 2018-03-23 ENCOUNTER — Other Ambulatory Visit: Payer: Self-pay | Admitting: Physician Assistant

## 2018-03-25 ENCOUNTER — Other Ambulatory Visit: Payer: Self-pay | Admitting: Family Medicine

## 2018-03-25 DIAGNOSIS — E039 Hypothyroidism, unspecified: Secondary | ICD-10-CM

## 2018-04-02 ENCOUNTER — Other Ambulatory Visit: Payer: Self-pay | Admitting: Family Medicine

## 2018-04-02 DIAGNOSIS — G63 Polyneuropathy in diseases classified elsewhere: Principal | ICD-10-CM

## 2018-04-02 DIAGNOSIS — B20 Human immunodeficiency virus [HIV] disease: Secondary | ICD-10-CM

## 2018-04-18 ENCOUNTER — Other Ambulatory Visit: Payer: Self-pay | Admitting: Family Medicine

## 2018-04-18 DIAGNOSIS — E039 Hypothyroidism, unspecified: Secondary | ICD-10-CM

## 2018-04-18 DIAGNOSIS — M109 Gout, unspecified: Secondary | ICD-10-CM

## 2018-04-19 ENCOUNTER — Encounter: Payer: Self-pay | Admitting: Family Medicine

## 2018-04-19 ENCOUNTER — Other Ambulatory Visit: Payer: Self-pay | Admitting: Family Medicine

## 2018-04-19 ENCOUNTER — Ambulatory Visit (INDEPENDENT_AMBULATORY_CARE_PROVIDER_SITE_OTHER): Payer: Medicaid Other | Admitting: Family Medicine

## 2018-04-19 VITALS — BP 127/89 | HR 65 | Temp 97.8°F | Resp 16 | Ht 70.0 in | Wt 231.0 lb

## 2018-04-19 DIAGNOSIS — G4762 Sleep related leg cramps: Secondary | ICD-10-CM

## 2018-04-19 DIAGNOSIS — I151 Hypertension secondary to other renal disorders: Secondary | ICD-10-CM

## 2018-04-19 DIAGNOSIS — N2889 Other specified disorders of kidney and ureter: Secondary | ICD-10-CM | POA: Diagnosis not present

## 2018-04-19 DIAGNOSIS — B20 Human immunodeficiency virus [HIV] disease: Secondary | ICD-10-CM

## 2018-04-19 DIAGNOSIS — N184 Chronic kidney disease, stage 4 (severe): Secondary | ICD-10-CM | POA: Diagnosis not present

## 2018-04-19 DIAGNOSIS — I129 Hypertensive chronic kidney disease with stage 1 through stage 4 chronic kidney disease, or unspecified chronic kidney disease: Secondary | ICD-10-CM

## 2018-04-19 LAB — POCT URINALYSIS DIPSTICK
BILIRUBIN UA: NEGATIVE
GLUCOSE UA: NEGATIVE
KETONES UA: NEGATIVE
LEUKOCYTES UA: NEGATIVE
Nitrite, UA: NEGATIVE
Protein, UA: POSITIVE — AB
RBC UA: NEGATIVE
SPEC GRAV UA: 1.015 (ref 1.010–1.025)
UROBILINOGEN UA: 0.2 U/dL
pH, UA: 5 (ref 5.0–8.0)

## 2018-04-19 NOTE — Patient Instructions (Signed)
Leg Cramps Leg cramps occur when a muscle or muscles tighten and you have no control over this tightening (involuntary muscle contraction). Muscle cramps can develop in any muscle, but the most common place is in the calf muscles of the leg. Those cramps can occur during exercise or when you are at rest. Leg cramps are painful, and they may last for a few seconds to a few minutes. Cramps may return several times before they finally stop. Usually, leg cramps are not caused by a serious medical problem. In many cases, the cause is not known. Some common causes include:  Overexertion.  Overuse from repetitive motions, or doing the same thing over and over.  Remaining in a certain position for a long period of time.  Improper preparation, form, or technique while performing a sport or an activity.  Dehydration.  Injury.  Side effects of some medicines.  Abnormally low levels of the salts and ions in your blood (electrolytes), especially potassium and calcium. These levels could be low if you are taking water pills (diuretics) or if you are pregnant.  Follow these instructions at home: Watch your condition for any changes. Taking the following actions may help to lessen any discomfort that you are feeling:  Stay well-hydrated. Drink enough fluid to keep your urine clear or pale yellow.  Try massaging, stretching, and relaxing the affected muscle. Do this for several minutes at a time.  For tight or tense muscles, use a warm towel, heating pad, or hot shower water directed to the affected area.  If you are sore or have pain after a cramp, applying ice to the affected area may relieve discomfort. ? Put ice in a plastic bag. ? Place a towel between your skin and the bag. ? Leave the ice on for 20 minutes, 2-3 times per day.  Avoid strenuous exercise for several days if you have been having frequent leg cramps.  Make sure that your diet includes the essential minerals for your muscles to  work normally.  Take medicines only as directed by your health care provider.  Contact a health care provider if:  Your leg cramps get more severe or more frequent, or they do not improve over time.  Your foot becomes cold, numb, or blue. This information is not intended to replace advice given to you by your health care provider. Make sure you discuss any questions you have with your health care provider. Document Released: 11/20/2004 Document Revised: 03/20/2016 Document Reviewed: 09/20/2014 Elsevier Interactive Patient Education  [employer].

## 2018-04-19 NOTE — Progress Notes (Signed)
Subjective:    Albert Eaton is a 52 y.o. male with a history of HIV, stage 4 CKD and  a who presents for a follow up of chronic conditions. Patient is also complaining of intermittent leg cramps that primarily occur at bedtime. Patient states that leg cramps have been worsening over the past several weeks. Patient denies leg pain at present.   Patient has has history of stage 4 chronic kidney disease. Patient is followed by nephrology every 3 months.  Patient has been unable to exercise routinely and is not adherent to  balanced diet. Patient is concerned because fluids have been restricted and he needs more than 2 liters of water to take medication. Patient is on restricted fluid regimen (2 liters per day). Patient denies chest pain, dyspnea, fatigue, irregular heart beat, orthopnea, palpitations, syncope and tachypnea.  Patient states that he is taking all medications consistently.   Past Medical History:  Diagnosis Date  . Anemia   . Chronic diastolic CHF (congestive heart failure) ([place]) 01/06/2017   Echo 7/16 [facility] in [place], [name]) Mild AI, mild LAE, mild concentric LVH, EF 55, normal wall motion, mild to moderate MR, mild PI, RVSP 55 // Echo 10/09/16 ([facility]):  Moderate LVH, grade 2 diastolic dysfunction, mild MR, moderate LAE   . CKD (chronic kidney disease)    per pt, waiting on a kidney transplant in near future.  . Gout   . History of nuclear stress test    a. Nuc study 7/16: no scar or ischemia, EF 42 // b. Nuc study 12/17: EF 48, ?small apical ischemia, Low Risk  . HIV (human immunodeficiency virus infection) ([place])   . HLD (hyperlipidemia)   . Hypertension   . Hypertensive heart disease with CHF (congestive heart failure) ([place]) 10/09/2016  . OSA (obstructive sleep apnea)   . Post-operative nausea and vomiting    1 time as a child  . Sleep apnea    uses c-pap   Social History   Socioeconomic History  . Marital status: Single    Spouse name: Not on file  . Number  of children: Not on file  . Years of education: Not on file  . Highest education level: Not on file  Occupational History  . Not on file  Social Needs  . Financial resource strain: Not on file  . Food insecurity:    Worry: Not on file    Inability: Not on file  . Transportation needs:    Medical: Not on file    Non-medical: Not on file  Tobacco Use  . Smoking status: Former Smoker    Types: Cigarettes    Last attempt to quit: 2000    Years since quitting: 19.4  . Smokeless tobacco: Never Used  Substance and Sexual Activity  . Alcohol use: Yes    Alcohol/week: 1.2 oz    Types: 2 Shots of liquor per week    Comment: Light use  . Drug use: No  . Sexual activity: Never    Partners: Male    Birth control/protection: Condom    Comment: declined condoms  Lifestyle  . Physical activity:    Days per week: Not on file    Minutes per session: Not on file  . Stress: Not on file  Relationships  . Social connections:    Talks on phone: Not on file    Gets together: Not on file    Attends religious service: Not on file    Active member of club  or organization: Not on file    Attends meetings of clubs or organizations: Not on file    Relationship status: Not on file  . Intimate partner violence:    Fear of current or ex partner: Not on file    Emotionally abused: Not on file    Physically abused: Not on file    Forced sexual activity: Not on file  Other Topics Concern  . Not on file  Social History Narrative  . Not on file   Immunization History  Administered Date(s) Administered  . Influenza,inj,Quad PF,6+ Mos 10/10/2016, 08/31/2017  . Meningococcal Mcv4o 10/15/2016, 08/31/2017  . Pneumococcal Polysaccharide-23 10/15/2016  . Tdap 01/27/2017   Review of Systems  Constitutional: Negative.  Negative for malaise/fatigue.  HENT: Negative.   Eyes: Negative.   Respiratory: Positive for cough and shortness of breath.   Cardiovascular: Negative.  Negative for chest pain and  leg swelling.  Gastrointestinal: Negative.   Genitourinary: Negative.  Negative for dysuria, frequency and urgency.  Musculoskeletal: Negative.  Negative for back pain and joint pain.  Skin: Negative.   Neurological: Negative.  Negative for weakness.  Endo/Heme/Allergies: Negative.   Psychiatric/Behavioral: Negative.    Objective:  Physical Exam  Constitutional: He is well-developed, well-nourished, and in no distress.  HENT:  Head: Normocephalic and atraumatic.  Right Ear: External ear normal.  Left Ear: External ear normal.  Nose: Nose normal.  Mouth/Throat: Oropharynx is clear and moist.  Eyes: Pupils are equal, round, and reactive to light.  Neck: Normal range of motion. Neck supple.  Abdominal: Soft. Bowel sounds are normal.  Skin: Skin is warm and dry.  Psychiatric: Mood, memory, affect and judgment normal.     Assessment:  BP 127/89 (BP Location: Right Arm, Patient Position: Sitting, Cuff Size: Large)   Pulse 65   Temp 97.8 F (36.6 C) (Oral)   Resp 16   Ht 5' 10"  (1.778 m)   Wt 231 lb (104.8 kg)   SpO2 98%   BMI 33.15 kg/m   Plan  1. Hypertension secondary to other renal disorders Blood pressure is at goal on current medication regimen.  Recommend that patient follows up with cardiology as scheduled for medication manament.  We have discussed target BP range and blood pressure goal. I have advised patient to check BP regularly and to call us back or report to clinic if the numbers are consistently higher than 140/90. We discussed the importance of compliance with medical therapy and DASH diet recommended, consequences of uncontrolled hypertension discussed.  - continue current BP medications  - Urinalysis Dipstick - Basic Metabolic Panel  2. Nocturnal leg cramps - Basic Metabolic Panel  3. CKD stage 4 secondary to hypertension [facility]) Advised patient to continue restricting fluid intake Also, advised to check weights daily in am and maintain a diary Follow up  with nephrology as scheduled  4. HIV disease ([place]) Follow up in ID as scheduled    The patient was given clear instructions to go to ER or return to medical center if symptoms do not improve, worsen or new problems develop. The patient verbalized understanding.    RTC: 3 months for chronic conditions. Will follow up by phone with any abnormal laboratory results.   [name]  MSN, FNP-C [facility] [address] [place], [place] [address] [phone]

## 2018-04-20 LAB — BASIC METABOLIC PANEL
BUN/Creatinine Ratio: 11 (ref 9–20)
BUN: 45 mg/dL — AB (ref 6–24)
CALCIUM: 10.4 mg/dL — AB (ref 8.7–10.2)
CO2: 22 mmol/L (ref 20–29)
CREATININE: 3.99 mg/dL — AB (ref 0.76–1.27)
Chloride: 101 mmol/L (ref 96–106)
GFR calc non Af Amer: 16 mL/min/{1.73_m2} — ABNORMAL LOW (ref >59)
GFR, EST AFRICAN AMERICAN: 19 mL/min/{1.73_m2} — AB (ref >59)
Glucose: 89 mg/dL (ref 65–99)
Potassium: 4.2 mmol/L (ref 3.5–5.2)
Sodium: 139 mmol/L (ref 134–144)

## 2018-05-04 ENCOUNTER — Other Ambulatory Visit: Payer: Self-pay | Admitting: Family Medicine

## 2018-05-04 DIAGNOSIS — B20 Human immunodeficiency virus [HIV] disease: Secondary | ICD-10-CM

## 2018-05-04 DIAGNOSIS — G63 Polyneuropathy in diseases classified elsewhere: Principal | ICD-10-CM

## 2018-05-16 ENCOUNTER — Other Ambulatory Visit: Payer: Self-pay | Admitting: Family Medicine

## 2018-05-16 ENCOUNTER — Other Ambulatory Visit: Payer: Self-pay | Admitting: Cardiovascular Disease

## 2018-05-16 DIAGNOSIS — E039 Hypothyroidism, unspecified: Secondary | ICD-10-CM

## 2018-05-16 DIAGNOSIS — M109 Gout, unspecified: Secondary | ICD-10-CM

## 2018-05-17 ENCOUNTER — Telehealth: Payer: Self-pay | Admitting: *Deleted

## 2018-05-17 ENCOUNTER — Ambulatory Visit: Payer: Medicaid Other | Admitting: Cardiovascular Disease

## 2018-05-17 NOTE — Telephone Encounter (Signed)
Received notice patient's medicaid will not pay for epivir. He may have lost insurance, needs Financial Counseling. [place] Eaton

## 2018-05-24 ENCOUNTER — Ambulatory Visit: Payer: Medicaid Other | Admitting: Cardiovascular Disease

## 2018-06-09 ENCOUNTER — Other Ambulatory Visit: Payer: Self-pay

## 2018-06-09 DIAGNOSIS — N185 Chronic kidney disease, stage 5: Secondary | ICD-10-CM

## 2018-06-12 ENCOUNTER — Other Ambulatory Visit: Payer: Self-pay | Admitting: Cardiovascular Disease

## 2018-06-13 ENCOUNTER — Encounter (HOSPITAL_COMMUNITY): Payer: Self-pay | Admitting: Emergency Medicine

## 2018-06-13 ENCOUNTER — Other Ambulatory Visit: Payer: Self-pay

## 2018-06-13 ENCOUNTER — Inpatient Hospital Stay (HOSPITAL_COMMUNITY)
Admission: EM | Admit: 2018-06-13 | Discharge: 2018-06-14 | DRG: 683 | Disposition: A | Discharge status: Home / Self Care | Payer: Medicaid Other | Attending: Internal Medicine | Admitting: Internal Medicine

## 2018-06-13 ENCOUNTER — Other Ambulatory Visit: Payer: Self-pay | Admitting: Physician Assistant

## 2018-06-13 DIAGNOSIS — N179 Acute kidney failure, unspecified: Principal | ICD-10-CM | POA: Diagnosis present

## 2018-06-13 DIAGNOSIS — Z79899 Other long term (current) drug therapy: Secondary | ICD-10-CM

## 2018-06-13 DIAGNOSIS — B20 Human immunodeficiency virus [HIV] disease: Secondary | ICD-10-CM | POA: Diagnosis present

## 2018-06-13 DIAGNOSIS — D631 Anemia in chronic kidney disease: Secondary | ICD-10-CM | POA: Diagnosis present

## 2018-06-13 DIAGNOSIS — Z888 Allergy status to other drugs, medicaments and biological substances status: Secondary | ICD-10-CM | POA: Diagnosis not present

## 2018-06-13 DIAGNOSIS — E785 Hyperlipidemia, unspecified: Secondary | ICD-10-CM | POA: Diagnosis present

## 2018-06-13 DIAGNOSIS — M109 Gout, unspecified: Secondary | ICD-10-CM | POA: Diagnosis not present

## 2018-06-13 DIAGNOSIS — E876 Hypokalemia: Secondary | ICD-10-CM | POA: Diagnosis present

## 2018-06-13 DIAGNOSIS — Z820 Family history of epilepsy and other diseases of the nervous system: Secondary | ICD-10-CM | POA: Diagnosis not present

## 2018-06-13 DIAGNOSIS — Z79890 Hormone replacement therapy (postmenopausal): Secondary | ICD-10-CM

## 2018-06-13 DIAGNOSIS — G4733 Obstructive sleep apnea (adult) (pediatric): Secondary | ICD-10-CM | POA: Diagnosis present

## 2018-06-13 DIAGNOSIS — Z8249 Family history of ischemic heart disease and other diseases of the circulatory system: Secondary | ICD-10-CM | POA: Diagnosis not present

## 2018-06-13 DIAGNOSIS — Z7682 Awaiting organ transplant status: Secondary | ICD-10-CM | POA: Diagnosis not present

## 2018-06-13 DIAGNOSIS — Z9889 Other specified postprocedural states: Secondary | ICD-10-CM

## 2018-06-13 DIAGNOSIS — Z9049 Acquired absence of other specified parts of digestive tract: Secondary | ICD-10-CM | POA: Diagnosis not present

## 2018-06-13 DIAGNOSIS — I5032 Chronic diastolic (congestive) heart failure: Secondary | ICD-10-CM | POA: Diagnosis present

## 2018-06-13 DIAGNOSIS — Z87891 Personal history of nicotine dependence: Secondary | ICD-10-CM

## 2018-06-13 DIAGNOSIS — I130 Hypertensive heart and chronic kidney disease with heart failure and stage 1 through stage 4 chronic kidney disease, or unspecified chronic kidney disease: Secondary | ICD-10-CM | POA: Diagnosis present

## 2018-06-13 DIAGNOSIS — M79605 Pain in left leg: Secondary | ICD-10-CM | POA: Diagnosis not present

## 2018-06-13 DIAGNOSIS — Z9989 Dependence on other enabling machines and devices: Secondary | ICD-10-CM | POA: Diagnosis not present

## 2018-06-13 DIAGNOSIS — N184 Chronic kidney disease, stage 4 (severe): Secondary | ICD-10-CM | POA: Diagnosis present

## 2018-06-13 LAB — CBC WITH DIFFERENTIAL/PLATELET
Abs Immature Granulocytes: 0 10*3/uL (ref 0.0–0.1)
Basophils Absolute: 0 10*3/uL (ref 0.0–0.1)
Basophils Relative: 1 %
EOS PCT: 2 %
Eosinophils Absolute: 0.2 10*3/uL (ref 0.0–0.7)
HEMATOCRIT: 36.1 % — AB (ref 39.0–52.0)
HEMOGLOBIN: 12.5 g/dL — AB (ref 13.0–17.0)
Immature Granulocytes: 0 %
LYMPHS ABS: 2.4 10*3/uL (ref 0.7–4.0)
LYMPHS PCT: 34 %
MCH: 31.5 pg (ref 26.0–34.0)
MCHC: 34.6 g/dL (ref 30.0–36.0)
MCV: 90.9 fL (ref 78.0–100.0)
MONOS PCT: 11 %
Monocytes Absolute: 0.8 10*3/uL (ref 0.1–1.0)
Neutro Abs: 3.7 10*3/uL (ref 1.7–7.7)
Neutrophils Relative %: 52 %
Platelets: 289 10*3/uL (ref 150–400)
RBC: 3.97 MIL/uL — ABNORMAL LOW (ref 4.22–5.81)
RDW: 12.8 % (ref 11.5–15.5)
WBC: 7 10*3/uL (ref 4.0–10.5)

## 2018-06-13 LAB — BASIC METABOLIC PANEL
Anion gap: 17 — ABNORMAL HIGH (ref 5–15)
BUN: 95 mg/dL — AB (ref 6–20)
CHLORIDE: 92 mmol/L — AB (ref 98–111)
CO2: 27 mmol/L (ref 22–32)
Calcium: 10.6 mg/dL — ABNORMAL HIGH (ref 8.9–10.3)
Creatinine, Ser: 5.32 mg/dL — ABNORMAL HIGH (ref 0.61–1.24)
GFR calc non Af Amer: 11 mL/min — ABNORMAL LOW (ref >60)
GFR, EST AFRICAN AMERICAN: 13 mL/min — AB (ref >60)
Glucose, Bld: 126 mg/dL — ABNORMAL HIGH (ref 70–99)
POTASSIUM: 2.8 mmol/L — AB (ref 3.5–5.1)
SODIUM: 136 mmol/L (ref 135–145)

## 2018-06-13 LAB — URINALYSIS, ROUTINE W REFLEX MICROSCOPIC
Bacteria, UA: NONE SEEN
Bilirubin Urine: NEGATIVE
Glucose, UA: NEGATIVE mg/dL
Hgb urine dipstick: NEGATIVE
Ketones, ur: NEGATIVE mg/dL
Leukocytes, UA: NEGATIVE
Nitrite: NEGATIVE
Protein, ur: 100 mg/dL — AB
Specific Gravity, Urine: 1.01 (ref 1.005–1.030)
pH: 5 (ref 5.0–8.0)

## 2018-06-13 LAB — CREATININE, URINE, RANDOM: Creatinine, Urine: 84.16 mg/dL

## 2018-06-13 LAB — BRAIN NATRIURETIC PEPTIDE: B Natriuretic Peptide: 42.8 pg/mL (ref 0.0–100.0)

## 2018-06-13 LAB — SODIUM, URINE, RANDOM: Sodium, Ur: 52 mmol/L

## 2018-06-13 LAB — MAGNESIUM: Magnesium: 2.3 mg/dL (ref 1.7–2.4)

## 2018-06-13 MED ORDER — FAMOTIDINE 20 MG PO TABS
20.0000 mg | ORAL_TABLET | Freq: Two times a day (BID) | ORAL | Status: DC
Start: 2018-06-13 — End: 2018-06-14
  Administered 2018-06-13 – 2018-06-14 (×2): 20 mg via ORAL
  Filled 2018-06-13 (×2): qty 1

## 2018-06-13 MED ORDER — ALLOPURINOL 100 MG PO TABS: 100.0000 mg | ORAL_TABLET | Freq: Two times a day (BID) | ORAL | Status: DC

## 2018-06-13 MED ORDER — ONDANSETRON HCL 4 MG PO TABS: 4.0000 mg | ORAL_TABLET | Freq: Four times a day (QID) | ORAL | Status: DC | PRN

## 2018-06-13 MED ORDER — ONDANSETRON HCL 4 MG/2ML IJ SOLN: 4.0000 mg | Freq: Four times a day (QID) | INTRAMUSCULAR | Status: DC | PRN

## 2018-06-13 MED ORDER — CLONIDINE HCL 0.1 MG PO TABS
0.1000 mg | ORAL_TABLET | Freq: Two times a day (BID) | ORAL | Status: DC
  Administered 2018-06-13 – 2018-06-14 (×2): 0.1 mg via ORAL
  Filled 2018-06-13 (×2): qty 1

## 2018-06-13 MED ORDER — SODIUM CHLORIDE 0.9 % IV BOLUS
1000.0000 mL | Freq: Once | INTRAVENOUS | Status: AC
  Administered 2018-06-13: 1000 mL via INTRAVENOUS

## 2018-06-13 MED ORDER — POTASSIUM CHLORIDE CRYS ER 20 MEQ PO TBCR: 20.0000 meq | EXTENDED_RELEASE_TABLET | Freq: Two times a day (BID) | ORAL | Status: DC

## 2018-06-13 MED ORDER — GABAPENTIN 300 MG PO CAPS: 300.0000 mg | ORAL_CAPSULE | Freq: Three times a day (TID) | ORAL | Status: DC | PRN

## 2018-06-13 MED ORDER — GABAPENTIN 100 MG PO CAPS: 100.0000 mg | ORAL_CAPSULE | Freq: Three times a day (TID) | ORAL | Status: DC | PRN

## 2018-06-13 MED ORDER — LAMIVUDINE 10 MG/ML PO SOLN
100.0000 mg | Freq: Every day | ORAL | Status: DC
  Administered 2018-06-14: 100 mg via ORAL
  Filled 2018-06-13: qty 10

## 2018-06-13 MED ORDER — ALLOPURINOL 100 MG PO TABS
50.0000 mg | ORAL_TABLET | Freq: Two times a day (BID) | ORAL | Status: DC
  Administered 2018-06-14: 50 mg via ORAL
  Filled 2018-06-13: qty 0.5
  Filled 2018-06-13: qty 1

## 2018-06-13 MED ORDER — ABACAVIR SULFATE 300 MG PO TABS
300.0000 mg | ORAL_TABLET | Freq: Every day | ORAL | Status: DC
  Administered 2018-06-14: 300 mg via ORAL
  Filled 2018-06-13: qty 1

## 2018-06-13 MED ORDER — SODIUM CHLORIDE 0.9 % IV SOLN
INTRAVENOUS | Status: AC
  Administered 2018-06-14: via INTRAVENOUS

## 2018-06-13 MED ORDER — HYOSCYAMINE SULFATE 0.125 MG SL SUBL
0.1250 mg | SUBLINGUAL_TABLET | SUBLINGUAL | Status: DC | PRN
  Filled 2018-06-13: qty 1

## 2018-06-13 MED ORDER — AMLODIPINE BESYLATE 10 MG PO TABS
10.0000 mg | ORAL_TABLET | Freq: Every day | ORAL | Status: DC
  Administered 2018-06-14: 10 mg via ORAL
  Filled 2018-06-13: qty 1

## 2018-06-13 MED ORDER — SODIUM CHLORIDE 0.9% FLUSH: 3.0000 mL | Freq: Two times a day (BID) | INTRAVENOUS | Status: DC

## 2018-06-13 MED ORDER — HYDRALAZINE HCL 25 MG PO TABS
100.0000 mg | ORAL_TABLET | Freq: Three times a day (TID) | ORAL | Status: DC
  Administered 2018-06-13 – 2018-06-14 (×3): 100 mg via ORAL
  Filled 2018-06-13 (×3): qty 4

## 2018-06-13 MED ORDER — ACETAMINOPHEN 650 MG RE SUPP: 650.0000 mg | Freq: Four times a day (QID) | RECTAL | Status: DC | PRN

## 2018-06-13 MED ORDER — HYDRALAZINE HCL 20 MG/ML IJ SOLN
10.0000 mg | INTRAMUSCULAR | Status: DC | PRN
Start: 2018-06-13 — End: 2018-06-14

## 2018-06-13 MED ORDER — HEPARIN SODIUM (PORCINE) 5000 UNIT/ML IJ SOLN
5000.0000 [IU] | Freq: Three times a day (TID) | INTRAMUSCULAR | Status: DC
  Filled 2018-06-13: qty 1

## 2018-06-13 MED ORDER — ALBUTEROL SULFATE (2.5 MG/3ML) 0.083% IN NEBU: 2.5000 mg | INHALATION_SOLUTION | RESPIRATORY_TRACT | Status: DC | PRN

## 2018-06-13 MED ORDER — MORPHINE SULFATE (PF) 4 MG/ML IV SOLN
4.0000 mg | Freq: Once | INTRAVENOUS | Status: AC
  Administered 2018-06-13: 4 mg via INTRAVENOUS
  Filled 2018-06-13: qty 1

## 2018-06-13 MED ORDER — ACETAMINOPHEN 325 MG PO TABS: 650.0000 mg | ORAL_TABLET | Freq: Four times a day (QID) | ORAL | Status: DC | PRN

## 2018-06-13 MED ORDER — CARVEDILOL 25 MG PO TABS
25.0000 mg | ORAL_TABLET | Freq: Two times a day (BID) | ORAL | Status: DC
  Administered 2018-06-14: 25 mg via ORAL
  Filled 2018-06-13: qty 1

## 2018-06-13 MED ORDER — POTASSIUM CHLORIDE CRYS ER 20 MEQ PO TBCR
40.0000 meq | EXTENDED_RELEASE_TABLET | Freq: Once | ORAL | Status: AC
  Administered 2018-06-13: 40 meq via ORAL
  Filled 2018-06-13: qty 2

## 2018-06-13 MED ORDER — RALTEGRAVIR POTASSIUM 400 MG PO TABS
400.0000 mg | ORAL_TABLET | Freq: Two times a day (BID) | ORAL | Status: DC
  Administered 2018-06-13 – 2018-06-14 (×2): 400 mg via ORAL
  Filled 2018-06-13 (×2): qty 1

## 2018-06-13 MED ORDER — SENNOSIDES-DOCUSATE SODIUM 8.6-50 MG PO TABS: 1.0000 | ORAL_TABLET | Freq: Every evening | ORAL | Status: DC | PRN

## 2018-06-13 MED ORDER — OLOPATADINE HCL 0.1 % OP SOLN: 1.0000 [drp] | Freq: Every day | OPHTHALMIC | Status: DC | PRN

## 2018-06-13 MED ORDER — HYDROCODONE-ACETAMINOPHEN 5-325 MG PO TABS
1.0000 | ORAL_TABLET | ORAL | Status: DC | PRN
  Administered 2018-06-14: 1 via ORAL
  Filled 2018-06-13: qty 1

## 2018-06-13 MED ORDER — LEVOTHYROXINE SODIUM 25 MCG PO TABS
25.0000 ug | ORAL_TABLET | Freq: Every day | ORAL | Status: DC
  Administered 2018-06-14: 25 ug via ORAL
  Filled 2018-06-13: qty 1

## 2018-06-13 NOTE — ED Provider Notes (Signed)
[place] EMERGENCY DEPARTMENT Provider Note   CSN: [id number] Arrival date & time: 06/13/18  1731     History   Chief Complaint Chief Complaint  Patient presents with  . Leg Pain    HPI Albert Eaton is a 52 y.o. male with history of stage V kidney disease not begun on dialysis as well as HIV presenting for 1 month of bilateral leg weakness.  Patient states that weakness has worsened and now he is having generalized body pain for the past 1 day worse in his lower extremities compared to upper extremities.  Patient describes as cramping 10/10 in severity and constant.  Worse with movement and palpation.  Patient denies nausea/vomiting/diarrhea, trauma/injury, fever or cough.  Patient endorsing chills, shaking during initial evaluation, alert and oriented x4 answering all questions appropriately.  HPI  Past Medical History:  Diagnosis Date  . Anemia   . Chronic diastolic CHF (congestive heart failure) ([place]) 01/06/2017   Echo 7/16 [facility] in [place], [name]) Mild AI, mild LAE, mild concentric LVH, EF 55, normal wall motion, mild to moderate MR, mild PI, RVSP 55 // Echo 10/09/16 ([facility]):  Moderate LVH, grade 2 diastolic dysfunction, mild MR, moderate LAE   . CKD (chronic kidney disease)    per pt, waiting on a kidney transplant in near future.  . Gout   . History of nuclear stress test    a. Nuc study 7/16: no scar or ischemia, EF 42 // b. Nuc study 12/17: EF 48, ?small apical ischemia, Low Risk  . HIV (human immunodeficiency virus infection) ([place])   . HLD (hyperlipidemia)   . Hypertension   . Hypertensive heart disease with CHF (congestive heart failure) ([place]) 10/09/2016  . OSA (obstructive sleep apnea)   . Post-operative nausea and vomiting    1 time as a child  . Sleep apnea    uses c-pap    Patient Active Problem List   Diagnosis Date Noted  . Acute renal failure superimposed on stage 4 chronic kidney disease ([place]) 06/13/2018  . Hypokalemia  06/13/2018  . Abnormal CT scan, small bowel   . Perennial allergic rhinitis with a predominant nonallergic component 02/09/2018  . Allergic conjunctivitis 02/09/2018  . Wheezing 02/09/2018  . Atherosclerosis 12/09/2017  . Hypothyroidism 11/19/2017  . 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 SOB (shortness of breath) 06/07/2017  . Arthritis, gouty 01/27/2017  . Chronic diastolic CHF (congestive heart failure) ([place]) 01/06/2017  . OSA (obstructive sleep apnea) 01/06/2017  . Abnormal electrocardiogram (ECG) (EKG) 10/31/2016  . Chest pain 10/09/2016  . HIV disease ([place]) 10/09/2016  . CKD stage 4 secondary to hypertension ([place]) 10/09/2016  . Hypertensive heart disease with CHF (congestive heart failure) ([place]) 10/09/2016    Past Surgical History:  Procedure Laterality Date  . CHOLECYSTECTOMY    . GIVENS CAPSULE STUDY N/A 03/01/2018   Procedure: GIVENS CAPSULE STUDY;  Surgeon: Jerene Bears, MD;  Location: [place];  Service: Gastroenterology;  Laterality: N/A;  . HERNIA REPAIR     As baby  . NOSE SURGERY    . RENAL BIOPSY          Home Medications    Prior to Admission medications   Medication Sig Start Date End Date Taking? Authorizing Provider  abacavir (ZIAGEN) 300 MG tablet Take 1 tablet (300 mg total) by mouth daily. 03/16/18  Yes Campbell Riches, MD  albuterol (PROAIR HFA) 108 (90 Base) MCG/ACT inhaler Inhale 2 puffs into the lungs every  4 (four) hours as needed for wheezing or shortness of breath. 02/09/18  Yes Bobbitt, Sedalia Muta, MD  allopurinol (ZYLOPRIM) 100 MG tablet TAKE 1 TABLET(100 MG) BY MOUTH TWICE DAILY Patient taking differently: Take 100 mg by mouth 2 (two) times daily.  05/17/18  Yes Tresa Garter, MD  amLODipine (NORVASC) 10 MG tablet Take 1 tablet (10 mg total) by mouth daily. 10/31/16  Yes [name], FNP  azelastine (ASTELIN) 0.1 % nasal spray Place 2 sprays into both nostrils 2 (two) times daily as needed for rhinitis. Use in each nostril  as directed 03/09/18  Yes Bobbitt, Sedalia Muta, MD  carvedilol (COREG) 25 MG tablet TAKE 1 TABLET BY MOUTH TWICE DAILY WITH FOOD Patient taking differently: Take 25 mg by mouth 2 (two) times daily with a meal.  01/01/18  Yes [name], MD  cloNIDine (CATAPRES) 0.1 MG tablet TAKE 1 TABLET TWICE DAILY Patient taking differently: Take 0.1 mg by mouth 2 (two) times daily.  01/25/18  Yes [name], MD  famotidine (PEPCID) 20 MG tablet Take 1 tablet (20 mg total) 2 (two) times daily by mouth. 09/04/17  Yes [name], MD  gabapentin (NEURONTIN) 300 MG capsule TAKE 1 CAPSULE BY MOUTH THREE TIMES DAILY Patient taking differently: Take 300 mg by mouth 3 (three) times daily as needed (pain).  05/04/18  Yes Lanae Boast, FNP  hydrALAZINE (APRESOLINE) 100 MG tablet TAKE 1 TABLET BY MOUTH THREE TIMES DAILY Patient taking differently: Take 100 mg by mouth 3 (three) times daily.  05/17/18  Yes [name], MD  hyoscyamine (LEVSIN SL) 0.125 MG SL tablet Place 1 tablet (0.125 mg total) under the tongue every 4 (four) hours as needed. 01/27/18  Yes Levin Erp, PA  ipratropium (ATROVENT) 0.06 % nasal spray 2 sprays per nostril 2 to 3 times daily as needed Patient taking differently: Place 2 sprays into both nostrils 3 (three) times daily as needed for rhinitis.  02/09/18  Yes Bobbitt, Sedalia Muta, MD  lamivudine (EPIVIR) 100 MG tablet Take 1 tablet (100 mg total) by mouth daily. 03/16/18  Yes Campbell Riches, MD  levocetirizine (XYZAL) 5 MG tablet Take 1 tablet (5 mg total) by mouth every evening. 12/09/17  Yes [name], FNP  levothyroxine (SYNTHROID, LEVOTHROID) 25 MCG tablet TAKE 1 TABLET(25 MCG) BY MOUTH DAILY BEFORE BREAKFAST Patient taking differently: Take 25 mcg by mouth daily before breakfast.  05/17/18  Yes Tresa Garter, MD  metolazone (ZAROXOLYN) 2.5 MG tablet Take 2.5 mg by mouth every Tuesday. 06/01/18  Yes [provider]  Olopatadine HCl (PAZEO) 0.7 %  SOLN Place 1 drop into both eyes daily. Patient taking differently: Place 1 drop into both eyes daily as needed (dry eyes).  02/09/18  Yes Bobbitt, Sedalia Muta, MD  ondansetron (ZOFRAN ODT) 4 MG disintegrating tablet 30m ODT q4 hours prn nausea/vomit 06/01/17  Yes BMalvin Johns MD  potassium chloride SA (K-DUR,KLOR-CON) 20 MEQ tablet Take 1 tablet (20 mEq total) by mouth 2 (two) times daily. 03/24/18  Yes Weaver, Scott T, PA-C  raltegravir (ISENTRESS) 400 MG tablet Take 1 tablet (400 mg total) by mouth 2 (two) times daily. 03/16/18  Yes HCampbell Riches MD  spironolactone (ALDACTONE) 25 MG tablet Take 1 tablet (25 mg total) by mouth daily. 01/15/18  Yes HDorena Dew FNP  torsemide (DEMADEX) 100 MG tablet Take 100 mg by mouth 3 (three) times daily.  05/24/18  Yes [provider]  triamcinolone cream (KENALOG) 0.1 %  Apply 1 application topically 2 (two) times daily. Patient taking differently: Apply 1 application topically 2 (two) times daily as needed (skin irritation).  07/13/17  Yes [name], FNP  zinc gluconate 50 MG tablet Take 100 mg by mouth daily.   Yes [provider]  atorvastatin (LIPITOR) 20 MG tablet Take 2 tablets (40 mg total) by mouth daily. Patient not taking: Reported on 06/13/2018 12/10/17   [name], FNP    Family History Family History  Problem Relation Age of Onset  . Heart failure Mother   . Heart attack Maternal Grandmother 53  . Hypertension Sister   . Multiple sclerosis Sister   . Hypertension Brother   . Hypertension Sister   . Scoliosis Other   . Allergic rhinitis Neg Hx   . Angioedema Neg Hx   . Asthma Neg Hx   . Eczema Neg Hx   . Immunodeficiency Neg Hx   . Urticaria Neg Hx     Social History Social History   Tobacco Use  . Smoking status: Former Smoker    Types: Cigarettes    Last attempt to quit: 2000    Years since quitting: 19.6  . Smokeless tobacco: Never Used  Substance Use Topics  . Alcohol use: Yes     Alcohol/week: 2.0 standard drinks    Types: 2 Shots of liquor per week    Comment: Light use  . Drug use: No     Allergies   Lisinopril and Ace inhibitors   Review of Systems Review of Systems  Constitutional: Positive for chills and diaphoresis. Negative for fever.  HENT: Negative.  Negative for rhinorrhea and sore throat.   Eyes: Negative.  Negative for visual disturbance.  Respiratory: Negative.  Negative for cough and shortness of breath.   Cardiovascular: Negative.  Negative for chest pain.  Gastrointestinal: Negative.  Negative for abdominal pain, blood in stool, diarrhea, nausea and vomiting.  Genitourinary: Negative.  Negative for dysuria and hematuria.  Musculoskeletal: Positive for arthralgias and myalgias.  Skin: Negative.  Negative for color change and rash.  Neurological: Negative.  Negative for dizziness, syncope, weakness and headaches.       Negative for bowel or bladder incontinence.     Physical Exam Updated Vital Signs BP (!) 161/107   Pulse 84   Temp 98.9 F (37.2 C) (Oral)   Resp 13   SpO2 94%   Physical Exam  Constitutional: He is oriented to person, place, and time. He appears well-developed and well-nourished. He appears distressed.  HENT:  Head: Normocephalic and atraumatic.  Right Ear: External ear normal.  Left Ear: External ear normal.  Nose: Nose normal.  Eyes: Pupils are equal, round, and reactive to light. EOM are normal.  Neck: Normal range of motion. Neck supple.  Cardiovascular: Normal rate, regular rhythm, normal heart sounds and intact distal pulses.  Pulmonary/Chest: Effort normal and breath sounds normal. No respiratory distress.  Abdominal: Soft. There is no tenderness. There is no rebound and no guarding.  Musculoskeletal: Normal range of motion.  Neurological: He is alert and oriented to person, place, and time. He has normal strength. No sensory deficit.  Mental Status: Alert, oriented, thought content appropriate, able to  give a coherent history. Speech fluent without evidence of aphasia. Able to follow 2 step commands without difficulty. Motor: Normal tone. 5/5 strength in upper extremities bilaterally including strong and equal grip strength.  Patient is reluctant to move his lower extremities due to pain.  He has full  range of motion of lower extremities, decreased strength against resistance with dorsi and plantarflexion however patient states that this is due to increased pain. Sensory: Sensation intact to light touch in all extremities. CV: distal pulses palpable throughout   Skin: Skin is warm. Capillary refill takes less than 2 seconds.  Psychiatric: He has a normal mood and affect. His behavior is normal.      ED Treatments / Results  Labs (all labs ordered are listed, but only abnormal results are displayed) Labs Reviewed  CBC WITH DIFFERENTIAL/PLATELET - Abnormal; Notable for the following components:      Result Value   RBC 3.97 (*)    Hemoglobin 12.5 (*)    HCT 36.1 (*)    All other components within normal limits  BASIC METABOLIC PANEL - Abnormal; Notable for the following components:   Potassium 2.8 (*)    Chloride 92 (*)    Glucose, Bld 126 (*)    BUN 95 (*)    Creatinine, Ser 5.32 (*)    Calcium 10.6 (*)    GFR calc non Af Amer 11 (*)    GFR calc Af Amer 13 (*)    Anion gap 17 (*)    All other components within normal limits  URINALYSIS, ROUTINE W REFLEX MICROSCOPIC - Abnormal; Notable for the following components:   Color, Urine STRAW (*)    Protein, ur 100 (*)    All other components within normal limits  BRAIN NATRIURETIC PEPTIDE  SODIUM, URINE, RANDOM  CREATININE, URINE, RANDOM  MAGNESIUM  UREA NITROGEN, URINE  BASIC METABOLIC PANEL    EKG None  Radiology No results found.  Procedures Procedures (including critical care time)  Medications Ordered in ED Medications  hydrALAZINE (APRESOLINE) injection 10 mg (has no administration in time range)  abacavir  (ZIAGEN) tablet 300 mg (has no administration in time range)  lamiVUDine (EPIVIR) 10 MG/ML solution 100 mg (has no administration in time range)  raltegravir (ISENTRESS) tablet 400 mg (has no administration in time range)  amLODipine (NORVASC) tablet 10 mg (has no administration in time range)  carvedilol (COREG) tablet 25 mg (has no administration in time range)  cloNIDine (CATAPRES) tablet 0.1 mg (0.1 mg Oral Given 06/13/18 2334)  hydrALAZINE (APRESOLINE) tablet 100 mg (100 mg Oral Given 06/13/18 2334)  levothyroxine (SYNTHROID, LEVOTHROID) tablet 25 mcg (has no administration in time range)  famotidine (PEPCID) tablet 20 mg (20 mg Oral Given 06/13/18 2334)  hyoscyamine (LEVSIN SL) SL tablet 0.125 mg (has no administration in time range)  potassium chloride SA (K-DUR,KLOR-CON) CR tablet 20 mEq (has no administration in time range)  albuterol (PROVENTIL) (2.5 MG/3ML) 0.083% nebulizer solution 2.5 mg (has no administration in time range)  olopatadine (PATANOL) 0.1 % ophthalmic solution 1 drop (has no administration in time range)  heparin injection 5,000 Units (has no administration in time range)  sodium chloride flush (NS) 0.9 % injection 3 mL (has no administration in time range)  0.9 %  sodium chloride infusion (has no administration in time range)  acetaminophen (TYLENOL) tablet 650 mg (has no administration in time range)    Or  acetaminophen (TYLENOL) suppository 650 mg (has no administration in time range)  HYDROcodone-acetaminophen (NORCO/VICODIN) 5-325 MG per tablet 1-2 tablet (has no administration in time range)  senna-docusate (Senokot-S) tablet 1 tablet (has no administration in time range)  ondansetron (ZOFRAN) tablet 4 mg (has no administration in time range)    Or  ondansetron (ZOFRAN) injection 4 mg (has no administration in  time range)  allopurinol (ZYLOPRIM) tablet 50 mg (has no administration in time range)  gabapentin (NEURONTIN) capsule 100 mg (has no administration in  time range)  sodium chloride 0.9 % bolus 1,000 mL (1,000 mLs Intravenous New Bag/Given 06/13/18 2005)  morphine 4 MG/ML injection 4 mg (4 mg Intravenous Given 06/13/18 2006)  potassium chloride SA (K-DUR,KLOR-CON) CR tablet 40 mEq (40 mEq Oral Given 06/13/18 2135)     Initial Impression / Assessment and Plan / ED Course  I have reviewed the triage vital signs and the nursing notes.  Pertinent labs & imaging results that were available during my care of the patient were reviewed by me and considered in my medical decision making (see chart for details).  Clinical Course as of Jun 14 2347  Nancy Fetter Jun 13, 2018  2114 Consult called to Dr. Myna Hidalgo who agrees to evaluate patient in emergency department for admission.   [BM]  2132 Patient has been admitted to Dr. Criss Rosales service for further evaluation and observation.   [BM]    Clinical Course User Index [BM] Deliah Boston, PA-C   Patient with history of CKD and HIV presenting for severe body aches/cramping for the past day.  Urinalysis shows proteinuria. BMP shows worsening kidney function and hypokalemia.  Potassium replenishment and IV fluids given in the emergency department. CBC nonacute. BNP within normal limits. Pain medication given in emergency department with some relief of symptoms.  Patient afebrile, not tachycardic.  Patient is hypertensive with history of hypertension.  Upon reevaluation prior to admission patient resting comfortably in room with family at bedside.  No acute distress at rest.  Patient still tender to bilateral lower extremities.  Patient with full range of motion and sensation intact to bilateral lower extremities.  Due to concern of worsening kidney function as well as hypokalemia consult was called to admit patient to the hospital.  Patient was accepted by Dr. Myna Hidalgo and patient has been admitted to the hospital.   Final Clinical Impressions(s) / ED Diagnoses   Final diagnoses:  AKI (acute kidney injury)  [facility])  Hypokalemia    ED Discharge Orders    None       Gari Crown 06/13/18 2352    Milton Ferguson, MD 06/15/18 1206

## 2018-06-13 NOTE — ED Notes (Signed)
FT PA notified of patient and orders received.

## 2018-06-13 NOTE — ED Triage Notes (Addendum)
Pt to ED triage via [facility].  C/o muscle weakness to bilateral legs x 1 month.  Today legs started cramping.  Also reports ammonia taste in mouth since last night.  Family reports pt has been crying out in pain all afternoon.

## 2018-06-13 NOTE — H&P (Signed)
History and Physical    Albert Eaton OMV:[id number] DOB: 03-07-1966 DOA: 06/13/2018  PCP: Lanae Boast, FNP   Patient coming from: Home   Chief Complaint: Leg cramps   HPI: Albert Eaton is a 52 y.o. male with medical history significant for HIV, chronic kidney disease stage IV, and hypertension, now presenting to the emergency department for evaluation of severe leg cramps.  Patient reports that severe leg cramps developed today, but was also complaining of this at his PCP clinic in June.  He reports diarrhea couple days ago that has since resolved, denies chest pain or palpitations, reports mild chronic dyspnea, denies fevers, and denies any significant leg swelling or tenderness.  Denies any trauma or injury to the lower extremities.  Reports continued adherence with his medications.  He is following with nephrology and scheduled for vein mapping in the vascular clinic next week.  ED Course: Upon arrival to the ED, patient is found to be afebrile, saturating well on room air, hypertensive, and vitals otherwise normal.  Chemistry panel is notable for potassium of 2.8, BUN of 95, and creatinine of 5.32.  CBC features a mild stable normocytic anemia.  BNP is normal and urinalysis notable for proteinuria.  BUN and creatinine had been 45 and 3.99, respectively, in April.  He was given a liter of normal saline, 40 mEq oral potassium, and 4 mg IV morphine in the he remains hemodynamically stable, in no apparent respiratory distress, and will be admitted for ongoing evaluation and management of acute kidney injury superimposed on chronic kidney disease stage IV.  Review of Systems:  All other systems reviewed and apart from HPI, are negative.  Past Medical History:  Diagnosis Date  . Anemia   . Chronic diastolic CHF (congestive heart failure) ([place]) 01/06/2017   Echo 7/16 [facility] in [place], [name]) Mild AI, mild LAE, mild concentric LVH, EF 55, normal wall motion, mild to moderate MR, mild  PI, RVSP 55 // Echo 10/09/16 ([facility]):  Moderate LVH, grade 2 diastolic dysfunction, mild MR, moderate LAE   . CKD (chronic kidney disease)    per pt, waiting on a kidney transplant in near future.  . Gout   . History of nuclear stress test    a. Nuc study 7/16: no scar or ischemia, EF 42 // b. Nuc study 12/17: EF 48, ?small apical ischemia, Low Risk  . HIV (human immunodeficiency virus infection) ([place])   . HLD (hyperlipidemia)   . Hypertension   . Hypertensive heart disease with CHF (congestive heart failure) ([place]) 10/09/2016  . OSA (obstructive sleep apnea)   . Post-operative nausea and vomiting    1 time as a child  . Sleep apnea    uses c-pap    Past Surgical History:  Procedure Laterality Date  . CHOLECYSTECTOMY    . GIVENS CAPSULE STUDY N/A 03/01/2018   Procedure: GIVENS CAPSULE STUDY;  Surgeon: Jerene Bears, MD;  Location: [place];  Service: Gastroenterology;  Laterality: N/A;  . HERNIA REPAIR     As baby  . NOSE SURGERY    . RENAL BIOPSY       reports that he quit smoking about 19 years ago. His smoking use included cigarettes. He has never used smokeless tobacco. He reports that he drinks about 2.0 standard drinks of alcohol per week. He reports that he does not use drugs.  Allergies  Allergen Reactions  . Lisinopril Cough  . Ace Inhibitors Cough    Family History  Problem Relation  Age of Onset  . Heart failure Mother   . Heart attack Maternal Grandmother 53  . Hypertension Sister   . Multiple sclerosis Sister   . Hypertension Brother   . Hypertension Sister   . Scoliosis Other   . Allergic rhinitis Neg Hx   . Angioedema Neg Hx   . Asthma Neg Hx   . Eczema Neg Hx   . Immunodeficiency Neg Hx   . Urticaria Neg Hx      Prior to Admission medications   Medication Sig Start Date End Date Taking? Authorizing Provider  abacavir (ZIAGEN) 300 MG tablet Take 1 tablet (300 mg total) by mouth daily. 03/16/18  Yes Campbell Riches, MD  albuterol (PROAIR HFA)  108 (90 Base) MCG/ACT inhaler Inhale 2 puffs into the lungs every 4 (four) hours as needed for wheezing or shortness of breath. 02/09/18  Yes Bobbitt, Sedalia Muta, MD  allopurinol (ZYLOPRIM) 100 MG tablet TAKE 1 TABLET(100 MG) BY MOUTH TWICE DAILY Patient taking differently: Take 100 mg by mouth 2 (two) times daily.  05/17/18  Yes Tresa Garter, MD  amLODipine (NORVASC) 10 MG tablet Take 1 tablet (10 mg total) by mouth daily. 10/31/16  Yes [name], FNP  azelastine (ASTELIN) 0.1 % nasal spray Place 2 sprays into both nostrils 2 (two) times daily as needed for rhinitis. Use in each nostril as directed 03/09/18  Yes Bobbitt, Sedalia Muta, MD  carvedilol (COREG) 25 MG tablet TAKE 1 TABLET BY MOUTH TWICE DAILY WITH FOOD Patient taking differently: Take 25 mg by mouth 2 (two) times daily with a meal.  01/01/18  Yes [name], MD  cloNIDine (CATAPRES) 0.1 MG tablet TAKE 1 TABLET TWICE DAILY Patient taking differently: Take 0.1 mg by mouth 2 (two) times daily.  01/25/18  Yes [name], MD  famotidine (PEPCID) 20 MG tablet Take 1 tablet (20 mg total) 2 (two) times daily by mouth. 09/04/17  Yes [name], MD  gabapentin (NEURONTIN) 300 MG capsule TAKE 1 CAPSULE BY MOUTH THREE TIMES DAILY Patient taking differently: Take 300 mg by mouth 3 (three) times daily as needed (pain).  05/04/18  Yes Lanae Boast, FNP  hydrALAZINE (APRESOLINE) 100 MG tablet TAKE 1 TABLET BY MOUTH THREE TIMES DAILY Patient taking differently: Take 100 mg by mouth 3 (three) times daily.  05/17/18  Yes [name], MD  hyoscyamine (LEVSIN SL) 0.125 MG SL tablet Place 1 tablet (0.125 mg total) under the tongue every 4 (four) hours as needed. 01/27/18  Yes Levin Erp, PA  ipratropium (ATROVENT) 0.06 % nasal spray 2 sprays per nostril 2 to 3 times daily as needed Patient taking differently: Place 2 sprays into both nostrils 3 (three) times daily as needed for rhinitis.  02/09/18  Yes Bobbitt, Sedalia Muta,  MD  lamivudine (EPIVIR) 100 MG tablet Take 1 tablet (100 mg total) by mouth daily. 03/16/18  Yes Campbell Riches, MD  levocetirizine (XYZAL) 5 MG tablet Take 1 tablet (5 mg total) by mouth every evening. 12/09/17  Yes [name], FNP  levothyroxine (SYNTHROID, LEVOTHROID) 25 MCG tablet TAKE 1 TABLET(25 MCG) BY MOUTH DAILY BEFORE BREAKFAST Patient taking differently: Take 25 mcg by mouth daily before breakfast.  05/17/18  Yes Tresa Garter, MD  metolazone (ZAROXOLYN) 2.5 MG tablet Take 2.5 mg by mouth every Tuesday. 06/01/18  Yes [provider]  Olopatadine HCl (PAZEO) 0.7 % SOLN Place 1 drop into both eyes daily. Patient taking differently: Place 1 drop into both  eyes daily as needed (dry eyes).  02/09/18  Yes Bobbitt, Sedalia Muta, MD  ondansetron (ZOFRAN ODT) 4 MG disintegrating tablet 41m ODT q4 hours prn nausea/vomit 06/01/17  Yes BMalvin Johns MD  potassium chloride SA (K-DUR,KLOR-CON) 20 MEQ tablet Take 1 tablet (20 mEq total) by mouth 2 (two) times daily. 03/24/18  Yes Weaver, Scott T, PA-C  raltegravir (ISENTRESS) 400 MG tablet Take 1 tablet (400 mg total) by mouth 2 (two) times daily. 03/16/18  Yes HCampbell Riches MD  spironolactone (ALDACTONE) 25 MG tablet Take 1 tablet (25 mg total) by mouth daily. 01/15/18  Yes HDorena Dew FNP  torsemide (DEMADEX) 100 MG tablet Take 100 mg by mouth 3 (three) times daily.  05/24/18  Yes [provider]  triamcinolone cream (KENALOG) 0.1 % Apply 1 application topically 2 (two) times daily. Patient taking differently: Apply 1 application topically 2 (two) times daily as needed (skin irritation).  07/13/17  Yes HDorena Dew FNP  zinc gluconate 50 MG tablet Take 100 mg by mouth daily.   Yes [provider]  atorvastatin (LIPITOR) 20 MG tablet Take 2 tablets (40 mg total) by mouth daily. Patient not taking: Reported on 06/13/2018 12/10/17   HDorena Dew FNP    Physical Exam: Vitals:   06/13/18 1742  06/13/18 2115 06/13/18 2130  BP: (!) 158/96 (!) 172/111 (!) 189/103  Pulse: 86 78 81  Resp: 18 14 19   Temp: 98.9 F (37.2 C)    TempSrc: Oral    SpO2: 99% 96% 96%      Constitutional: NAD, calm  Eyes: PERTLA, lids and conjunctivae normal ENMT: Mucous membranes are moist. Posterior pharynx clear of any exudate or lesions.   Neck: normal, supple, no masses, no thyromegaly Respiratory: clear to auscultation bilaterally, no wheezing, no crackles. Normal respiratory effort.    Cardiovascular: S1 & S2 heard, regular rate and rhythm. No extremity edema.   Abdomen: No distension, no tenderness, no masses palpated. Bowel sounds normal.  Musculoskeletal: no clubbing / cyanosis. No joint deformity upper and lower extremities.    Skin: no significant rashes, lesions, ulcers. Warm, dry, well-perfused. Neurologic: CN 2-12 grossly intact. Sensation intact. Strength 5/5 in all 4 limbs.  Psychiatric: Alert and oriented x 3. Calm, cooperative.     Labs on Admission: I have personally reviewed following labs and imaging studies  CBC: Recent Labs  Lab 06/13/18 1802  WBC 7.0  NEUTROABS 3.7  HGB 12.5*  HCT 36.1*  MCV 90.9  PLT 2161  Basic Metabolic Panel: Recent Labs  Lab 06/13/18 1802 06/13/18 2209  NA 136  --   K 2.8*  --   CL 92*  --   CO2 27  --   GLUCOSE 126*  --   BUN 95*  --   CREATININE 5.32*  --   CALCIUM 10.6*  --   MG  --  2.3   GFR: CrCl cannot be calculated (Unknown ideal weight.). Liver Function Tests: No results for input(s): AST, ALT, ALKPHOS, BILITOT, PROT, ALBUMIN in the last 168 hours. No results for input(s): LIPASE, AMYLASE in the last 168 hours. No results for input(s): AMMONIA in the last 168 hours. Coagulation Profile: No results for input(s): INR, PROTIME in the last 168 hours. Cardiac Enzymes: No results for input(s): CKTOTAL, CKMB, CKMBINDEX, TROPONINI in the last 168 hours. BNP (last 3 results) No results for input(s): PROBNP in the last 8760  hours. HbA1C: No results for input(s): HGBA1C in the last 72 hours.  CBG: No results for input(s): GLUCAP in the last 168 hours. Lipid Profile: No results for input(s): CHOL, HDL, LDLCALC, TRIG, CHOLHDL, LDLDIRECT in the last 72 hours. Thyroid Function Tests: No results for input(s): TSH, T4TOTAL, FREET4, T3FREE, THYROIDAB in the last 72 hours. Anemia Panel: No results for input(s): VITAMINB12, FOLATE, FERRITIN, TIBC, IRON, RETICCTPCT in the last 72 hours. Urine analysis:    Component Value Date/Time   COLORURINE STRAW (A) 06/13/2018 2032   APPEARANCEUR CLEAR 06/13/2018 2032   LABSPEC 1.010 06/13/2018 2032   PHURINE 5.0 06/13/2018 2032   GLUCOSEU NEGATIVE 06/13/2018 2032   HGBUR NEGATIVE 06/13/2018 2032   BILIRUBINUR NEGATIVE 06/13/2018 2032   BILIRUBINUR neg 04/19/2018 [place] 06/13/2018 2032   PROTEINUR 100 (A) 06/13/2018 2032   UROBILINOGEN 0.2 04/19/2018 1121   UROBILINOGEN 0.2 01/15/2018 1140   NITRITE NEGATIVE 06/13/2018 2032   LEUKOCYTESUR NEGATIVE 06/13/2018 2032   Sepsis Labs: @LABRCNTIP (procalcitonin:4,lacticidven:4) )No results found for this or any previous visit (from the past 240 hour(s)).   Radiological Exams on Admission: No results found.  EKG: Not performed.   Assessment/Plan   1. Acute kidney injury superimposed on CKD stage IV  - Presents with leg cramps, reports recent diarrhea that has since resolved, and is found to have BUN 95 and SCr 5.32, up from 45 and 3.99 in April  - He follows with nephrology, scheduled for mapping with vascular clinic next week  - Prerenal azotemia seems most likely given the recent diarrhea  - He was given a liter of NS in ED  - Check urine chemistries, continue a gentle IVF hydration overnight, hold diuretics, repeat chem panel in am  2. HIV  - CD4 was 670 and VL undetectable in April '19  - Continue abacavir, lamivudinie, and raltegravir    3. Hypokalemia  - Serum potassium is 2.8 on admission with  normal mag level  - Treated in ED with 40 mEq oral potassium  - Likely secondary to diuretics, recent diarrhea  - Diuretics held on admission  - Cardiac monitoring, repeat chemistries in am   4. Hypertension  - BP elevated in ED  - Continue Norvasc, Coreg, clonidine, and hydralazine     DVT prophylaxis: sq heparin  Code Status: Full  Family Communication: Family updated at bedside Consults called: None Admission status: Inpatient     Vianne Bulls, MD [facility] Pager [phone]  If 7PM-7AM, please contact night-coverage [email or web address] Password [facility]  06/13/2018, 10:54 PM

## 2018-06-14 ENCOUNTER — Other Ambulatory Visit: Payer: Self-pay

## 2018-06-14 ENCOUNTER — Encounter (HOSPITAL_COMMUNITY): Payer: Self-pay

## 2018-06-14 DIAGNOSIS — B20 Human immunodeficiency virus [HIV] disease: Secondary | ICD-10-CM

## 2018-06-14 DIAGNOSIS — M109 Gout, unspecified: Secondary | ICD-10-CM

## 2018-06-14 DIAGNOSIS — N179 Acute kidney failure, unspecified: Principal | ICD-10-CM

## 2018-06-14 DIAGNOSIS — E039 Hypothyroidism, unspecified: Secondary | ICD-10-CM

## 2018-06-14 LAB — BASIC METABOLIC PANEL
ANION GAP: 11 (ref 5–15)
Anion gap: 12 (ref 5–15)
BUN: 88 mg/dL — ABNORMAL HIGH (ref 6–20)
BUN: 83 mg/dL — ABNORMAL HIGH (ref 6–20)
CALCIUM: 9.4 mg/dL (ref 8.9–10.3)
CHLORIDE: 97 mmol/L — AB (ref 98–111)
CO2: 28 mmol/L (ref 22–32)
CO2: 28 mmol/L (ref 22–32)
Calcium: 9.6 mg/dL (ref 8.9–10.3)
Chloride: 98 mmol/L (ref 98–111)
Creatinine, Ser: 5.01 mg/dL — ABNORMAL HIGH (ref 0.61–1.24)
Creatinine, Ser: 4.58 mg/dL — ABNORMAL HIGH (ref 0.61–1.24)
GFR calc Af Amer: 14 mL/min — ABNORMAL LOW (ref >60)
GFR calc Af Amer: 16 mL/min — ABNORMAL LOW (ref >60)
GFR calc non Af Amer: 12 mL/min — ABNORMAL LOW (ref >60)
GFR, EST NON AFRICAN AMERICAN: 13 mL/min — AB (ref >60)
GLUCOSE: 111 mg/dL — AB (ref 70–99)
GLUCOSE: 117 mg/dL — AB (ref 70–99)
Potassium: 2.8 mmol/L — ABNORMAL LOW (ref 3.5–5.1)
Potassium: 3.3 mmol/L — ABNORMAL LOW (ref 3.5–5.1)
Sodium: 137 mmol/L (ref 135–145)
Sodium: 137 mmol/L (ref 135–145)

## 2018-06-14 MED ORDER — ALLOPURINOL 100 MG PO TABS: ORAL_TABLET | ORAL | 0 refills | Status: DC

## 2018-06-14 MED ORDER — GABAPENTIN 100 MG PO CAPS: 100.0000 mg | ORAL_CAPSULE | Freq: Three times a day (TID) | ORAL | 0 refills | Status: DC | PRN

## 2018-06-14 MED ORDER — POTASSIUM CHLORIDE 10 MEQ/100ML IV SOLN
10.0000 meq | INTRAVENOUS | Status: AC
  Administered 2018-06-14 (×3): 10 meq via INTRAVENOUS
  Filled 2018-06-14 (×3): qty 100

## 2018-06-14 MED ORDER — ALLOPURINOL 100 MG PO TABS: 50.0000 mg | ORAL_TABLET | Freq: Two times a day (BID) | ORAL | 0 refills | Status: DC

## 2018-06-14 MED ORDER — LEVOTHYROXINE SODIUM 25 MCG PO TABS: ORAL_TABLET | ORAL | 0 refills | Status: DC

## 2018-06-14 MED ORDER — POTASSIUM CHLORIDE CRYS ER 20 MEQ PO TBCR
40.0000 meq | EXTENDED_RELEASE_TABLET | Freq: Once | ORAL | Status: AC
Start: 2018-06-14 — End: 2018-06-14
  Administered 2018-06-14: 40 meq via ORAL
  Filled 2018-06-14: qty 2

## 2018-06-14 MED ORDER — POTASSIUM CHLORIDE CRYS ER 20 MEQ PO TBCR
40.0000 meq | EXTENDED_RELEASE_TABLET | ORAL | Status: AC
  Administered 2018-06-14 (×2): 40 meq via ORAL
  Filled 2018-06-14 (×2): qty 2

## 2018-06-14 MED ORDER — TORSEMIDE 100 MG PO TABS: 100.0000 mg | ORAL_TABLET | Freq: Two times a day (BID) | ORAL | 3 refills | Status: DC

## 2018-06-14 NOTE — Discharge Summary (Signed)
Physician Discharge Summary   Patient ID: Albert Eaton MRN: [id number] DOB/AGE: Aug 12, 1966 52 y.o.  Admit date: 06/13/2018 Discharge date: 06/14/2018  Primary Care Physician:  Lanae Boast, FNP   Recommendations for Outpatient Follow-up:  1. Follow up with PCP in 1-2 weeks 2. Please obtain BMP in one week  3. Currently held metolazone, Aldactone.  4. Decrease the torsemide to 100 mg twice a day. He will follow-up with [facility] in 1 week.  Home Health: None  Equipment/Devices: None  Discharge Condition: stable CODE STATUS: FULL  Diet recommendation: Heart healthy diet   Discharge Diagnoses:    . Acute renal failure superimposed on stage 4 chronic kidney disease ([place]) . HIV disease ([place]) . OSA (obstructive sleep apnea) . Hypokalemia   Consults: Dr. Moshe Cipro, via phone consultation    Allergies:   Allergies  Allergen Reactions  . Lisinopril Cough  . Ace Inhibitors Cough     DISCHARGE MEDICATIONS: Allergies as of 06/14/2018      Reactions   Lisinopril Cough   Ace Inhibitors Cough      Medication List    STOP taking these medications   famotidine 20 MG tablet Commonly known as:  PEPCID   hyoscyamine 0.125 MG SL tablet Commonly known as:  LEVSIN SL   metolazone 2.5 MG tablet Commonly known as:  ZAROXOLYN   ondansetron 4 MG disintegrating tablet Commonly known as:  ZOFRAN-ODT   spironolactone 25 MG tablet Commonly known as:  ALDACTONE     TAKE these medications   abacavir 300 MG tablet Commonly known as:  ZIAGEN Take 1 tablet (300 mg total) by mouth daily.   albuterol 108 (90 Base) MCG/ACT inhaler Commonly known as:  PROVENTIL HFA;VENTOLIN HFA Inhale 2 puffs into the lungs every 4 (four) hours as needed for wheezing or shortness of breath.   allopurinol 100 MG tablet Commonly known as:  ZYLOPRIM Take 0.5 tablets (50 mg total) by mouth 2 (two) times daily.   amLODipine 10 MG tablet Commonly known as:  NORVASC Take 1  tablet (10 mg total) by mouth daily.   azelastine 0.1 % nasal spray Commonly known as:  ASTELIN Place 2 sprays into both nostrils 2 (two) times daily as needed for rhinitis. Use in each nostril as directed   carvedilol 25 MG tablet Commonly known as:  COREG TAKE 1 TABLET BY MOUTH TWICE DAILY WITH FOOD   cloNIDine 0.1 MG tablet Commonly known as:  CATAPRES TAKE 1 TABLET TWICE DAILY   gabapentin 100 MG capsule Commonly known as:  NEURONTIN Take 1 capsule (100 mg total) by mouth 3 (three) times daily as needed (pain). What changed:    medication strength  how much to take  when to take this  reasons to take this   hydrALAZINE 100 MG tablet Commonly known as:  APRESOLINE TAKE 1 TABLET BY MOUTH THREE TIMES DAILY   ipratropium 0.06 % nasal spray Commonly known as:  ATROVENT 2 sprays per nostril 2 to 3 times daily as needed What changed:    how much to take  how to take this  when to take this  reasons to take this  additional instructions   lamivudine 100 MG tablet Commonly known as:  EPIVIR Take 1 tablet (100 mg total) by mouth daily.   levocetirizine 5 MG tablet Commonly known as:  XYZAL Take 1 tablet (5 mg total) by mouth every evening.   levothyroxine 25 MCG tablet Commonly known as:  SYNTHROID, LEVOTHROID TAKE 1 TABLET(25 MCG)  BY MOUTH DAILY BEFORE BREAKFAST What changed:  See the new instructions.   Olopatadine HCl 0.7 % Soln Place 1 drop into both eyes daily. What changed:    when to take this  reasons to take this   potassium chloride SA 20 MEQ tablet Commonly known as:  K-DUR,KLOR-CON Take 1 tablet (20 mEq total) by mouth 2 (two) times daily.   raltegravir 400 MG tablet Commonly known as:  ISENTRESS Take 1 tablet (400 mg total) by mouth 2 (two) times daily.   torsemide 100 MG tablet Commonly known as:  DEMADEX Take 1 tablet (100 mg total) by mouth 2 (two) times daily. Start taking on:  06/15/2018 What changed:  when to take this    triamcinolone cream 0.1 % Commonly known as:  KENALOG Apply 1 application topically 2 (two) times daily. What changed:    when to take this  reasons to take this   zinc gluconate 50 MG tablet Take 100 mg by mouth daily.        Brief H and P: For complete details please refer to admission H and P, but in brief  Albert Eaton is a 52 y.o. male with medical history significant for HIV, chronic kidney disease stage IV, and hypertension, now presenting to the emergency department for evaluation of severe leg cramps.  Patient reports that severe leg cramps developed today, but was also complaining of this at his PCP clinic in June.  He reports diarrhea couple days ago that has since resolved, denies chest pain or palpitations, reports mild chronic dyspnea, denies fevers, and denies any significant leg swelling or tenderness.  Denies any trauma or injury to the lower extremities.  Reports continued adherence with his medications.  He is following with nephrology and scheduled for vein mapping in the vascular clinic on 8/20.  Hospital Course:     Acute kidney injury superimposed on stage 4 chronic kidney disease ([place]) -Likely precipitated due to diarrhea and dehydration, multiple diuretics including metolazone, torsemide, spironolactone - diuretics were held, patient was placed on IV fluid hydration in ED. -Creatinine 5.32 at the time of admission with potassium of 2.8, creatinine improved to 4.5 at the time of discharge.  Baseline creatinine 3.2-3.9.  BNP 42.8 -Patient has vein mapping, arterial duplex, vascular appointment for dialysis access tomorrow outpatient on  8/20 -Discussed with nephrology, Dr. Clover Mealy, recommended if patient has no uremia symptoms can be discharged safely to follow-up with his vascular appointments and he will be seen in nephrology clinic outpatient in 1 week for labs and hospital follow-up -Recommended patient to hold spironolactone, metolazone until follow-up  with nephrology and   restart torsemide at 100 mg twice a day.      HIV disease ([place]) -Continue abacavir, and amantadine, raltegravir    Hypokalemia -Replaced  Hypertension -Continue Norvasc, Coreg, clonidine, hydralazine  Day of Discharge S: Feels a lot better today, denies any specific complaints.  No nausea vomiting or diarrhea.  Leg cramps improved.  BP 136/88 (BP Location: Right Arm)   Pulse 63   Temp 98 F (36.7 C) (Oral)   Resp 18   SpO2 97%   Physical Exam: General: Alert and awake oriented x3 not in any acute distress. HEENT: anicteric sclera, pupils reactive to light and accommodation CVS: S1-S2 clear no murmur rubs or gallops Chest: clear to auscultation bilaterally, no wheezing rales or rhonchi Abdomen: soft nontender, nondistended, normal bowel sounds Extremities: no cyanosis, clubbing or edema noted bilaterally Neuro: Cranial nerves II-XII intact, no  focal neurological deficits   The results of significant diagnostics from this hospitalization (including imaging, microbiology, ancillary and laboratory) are listed below for reference.      Procedures/Studies:  No results found.    LAB RESULTS: Basic Metabolic Panel: Recent Labs  Lab 06/13/18 2209 06/14/18 0450 06/14/18 1307  NA  --  137 137  K  --  2.8* 3.3*  CL  --  97* 98  CO2  --  28 28  GLUCOSE  --  111* 117*  BUN  --  88* 83*  CREATININE  --  5.01* 4.58*  CALCIUM  --  9.4 9.6  MG 2.3  --   --    Liver Function Tests: No results for input(s): AST, ALT, ALKPHOS, BILITOT, PROT, ALBUMIN in the last 168 hours. No results for input(s): LIPASE, AMYLASE in the last 168 hours. No results for input(s): AMMONIA in the last 168 hours. CBC: Recent Labs  Lab 06/13/18 1802  WBC 7.0  NEUTROABS 3.7  HGB 12.5*  HCT 36.1*  MCV 90.9  PLT 289   Cardiac Enzymes: No results for input(s): CKTOTAL, CKMB, CKMBINDEX, TROPONINI in the last 168 hours. BNP: Invalid input(s): POCBNP CBG: No results  for input(s): GLUCAP in the last 168 hours.    Disposition and Follow-up: Discharge Instructions    Diet - low sodium heart healthy   Complete by:  As directed    Discharge instructions   Complete by:  As directed    Please HOLD Aldactone and metolazone until follow-up with Dr. Hollie Salk in 1 week.  Torsemide has been decreased to 100 mg twice a day. Please call [facility] after your procedures tomorrow and let them know that I spoke with Dr. Clover Mealy for the appointment in 1 week, you need labs for potassium and kidney function.   Increase activity slowly   Complete by:  As directed        DISPOSITION: home    [place], [place], [place]. Go on 07/02/2018.   Specialty:  Family Medicine Why:  Appointment on 07/02/18 at 9:20 am. Contact information: [place] [address] [phone]        Madelon Lips, MD. Go on 06/29/2018.   Specialty:  Nephrology Why:  You need labs for your kidney function and potassium, hospital follow-up.       Appointment on 06/29/18 at 1:15 pm.  Contact information: [place] [address] [phone]            Time coordinating discharge:  79mns   Signed:   REstill CottaM.D. [facility] 06/14/2018, 2:07 PM Pager: [phone]

## 2018-06-14 NOTE — Progress Notes (Addendum)
Patient discharged to home, AVS reviewed including medications and follow-up appointmets IV removed, telebox returned. Patient left floor via wheelchair with staff memebr

## 2018-06-15 ENCOUNTER — Other Ambulatory Visit: Payer: Self-pay

## 2018-06-15 ENCOUNTER — Ambulatory Visit (INDEPENDENT_AMBULATORY_CARE_PROVIDER_SITE_OTHER)
Admission: RE | Admit: 2018-06-15 | Discharge: 2018-06-15 | Disposition: A | Discharge status: Home / Self Care | Payer: Medicaid Other | Source: Ambulatory Visit | Attending: Vascular Surgery | Admitting: Vascular Surgery

## 2018-06-15 ENCOUNTER — Other Ambulatory Visit: Payer: Self-pay | Admitting: *Deleted

## 2018-06-15 ENCOUNTER — Ambulatory Visit (INDEPENDENT_AMBULATORY_CARE_PROVIDER_SITE_OTHER): Payer: Medicaid Other | Admitting: Vascular Surgery

## 2018-06-15 ENCOUNTER — Encounter: Payer: Self-pay | Admitting: Vascular Surgery

## 2018-06-15 VITALS — BP 157/96 | HR 76 | Resp 20 | Ht 70.0 in | Wt 236.0 lb

## 2018-06-15 DIAGNOSIS — N185 Chronic kidney disease, stage 5: Secondary | ICD-10-CM

## 2018-06-15 DIAGNOSIS — N184 Chronic kidney disease, stage 4 (severe): Secondary | ICD-10-CM | POA: Diagnosis not present

## 2018-06-15 LAB — UREA NITROGEN, URINE: UREA NITROGEN UR: 405 mg/dL

## 2018-06-15 NOTE — Progress Notes (Signed)
Patient name: Albert Eaton MRN: [id number] DOB: [birth year] Sex: male  REASON FOR CONSULT: AV fistula placement  HPI: Albert Eaton is a 52 y.o. male with history of congestive heart failure, HIV, hypertension, hyperlipidemia, and acute on chronic kidney failure (CKD stage IV) that presents for dialysis access evaluation.  Patient states he has never been on dialysis before.  He has never had a catheter before.  He is not currently dialyzing but was recently hospitalized for dehydration from diarrhea with acute on chronic kidney issues.  He is right-hand dominant although he does not work at this time.  He denies tobacco abuse.  No previous issues with either upper arm including pain, paresthesia, tissue loss, etc.  He seems more interested in peritoneal dialysis in our discussion today.  He has only ever had a lap  cholecystectomy before.  Past Medical History:  Diagnosis Date  . Anemia   . Chronic diastolic CHF (congestive heart failure) ([place]) 01/06/2017   Echo 7/16 [facility] in [place], [name]) Mild AI, mild LAE, mild concentric LVH, EF 55, normal wall motion, mild to moderate MR, mild PI, RVSP 55 // Echo 10/09/16 ([facility]):  Moderate LVH, grade 2 diastolic dysfunction, mild MR, moderate LAE   . CKD (chronic kidney disease)    per pt, waiting on a kidney transplant in near future.  . Gout   . History of nuclear stress test    a. Nuc study 7/16: no scar or ischemia, EF 42 // b. Nuc study 12/17: EF 48, ?small apical ischemia, Low Risk  . HIV (human immunodeficiency virus infection) ([place])   . HLD (hyperlipidemia)   . Hypertension   . Hypertensive heart disease with CHF (congestive heart failure) ([place]) 10/09/2016  . OSA (obstructive sleep apnea)   . Post-operative nausea and vomiting    1 time as a child  . Sleep apnea    uses c-pap    Past Surgical History:  Procedure Laterality Date  . CHOLECYSTECTOMY    . GIVENS CAPSULE STUDY N/A 03/01/2018   Procedure: GIVENS CAPSULE STUDY;   Surgeon: Jerene Bears, MD;  Location: [place];  Service: Gastroenterology;  Laterality: N/A;  . HERNIA REPAIR     As baby  . NOSE SURGERY    . RENAL BIOPSY      Family History  Problem Relation Age of Onset  . Heart failure Mother   . Heart attack Maternal Grandmother 53  . Hypertension Sister   . Multiple sclerosis Sister   . Hypertension Brother   . Hypertension Sister   . Scoliosis Other   . Allergic rhinitis Neg Hx   . Angioedema Neg Hx   . Asthma Neg Hx   . Eczema Neg Hx   . Immunodeficiency Neg Hx   . Urticaria Neg Hx     SOCIAL HISTORY: Social History   Socioeconomic History  . Marital status: Single    Spouse name: Not on file  . Number of children: Not on file  . Years of education: Not on file  . Highest education level: Not on file  Occupational History  . Not on file  Social Needs  . Financial resource strain: Not on file  . Food insecurity:    Worry: Not on file    Inability: Not on file  . Transportation needs:    Medical: Not on file    Non-medical: Not on file  Tobacco Use  . Smoking status: Former Smoker    Types: Cigarettes  Last attempt to quit: 2000    Years since quitting: 19.6  . Smokeless tobacco: Never Used  Substance and Sexual Activity  . Alcohol use: Yes    Alcohol/week: 2.0 standard drinks    Types: 2 Shots of liquor per week    Comment: Light use  . Drug use: No  . Sexual activity: Never    Partners: Male    Birth control/protection: Condom    Comment: declined condoms  Lifestyle  . Physical activity:    Days per week: Not on file    Minutes per session: Not on file  . Stress: Not on file  Relationships  . Social connections:    Talks on phone: Not on file    Gets together: Not on file    Attends religious service: Not on file    Active member of club or organization: Not on file    Attends meetings of clubs or organizations: Not on file    Relationship status: Not on file  . Intimate partner violence:     Fear of current or ex partner: Not on file    Emotionally abused: Not on file    Physically abused: Not on file    Forced sexual activity: Not on file  Other Topics Concern  . Not on file  Social History Narrative  . Not on file    Allergies  Allergen Reactions  . Lisinopril Cough  . Ace Inhibitors Cough    Current Outpatient Medications  Medication Sig Dispense Refill  . abacavir (ZIAGEN) 300 MG tablet Take 1 tablet (300 mg total) by mouth daily. 30 tablet 8  . albuterol (PROAIR HFA) 108 (90 Base) MCG/ACT inhaler Inhale 2 puffs into the lungs every 4 (four) hours as needed for wheezing or shortness of breath. 1 Inhaler 3  . allopurinol (ZYLOPRIM) 100 MG tablet Take 0.5 tablets (50 mg total) by mouth 2 (two) times daily. 60 tablet 0  . amLODipine (NORVASC) 10 MG tablet Take 1 tablet (10 mg total) by mouth daily. 30 tablet 1  . azelastine (ASTELIN) 0.1 % nasal spray Place 2 sprays into both nostrils 2 (two) times daily as needed for rhinitis. Use in each nostril as directed 30 mL 5  . carvedilol (COREG) 25 MG tablet TAKE 1 TABLET BY MOUTH TWICE DAILY WITH FOOD (Patient taking differently: Take 25 mg by mouth 2 (two) times daily with a meal. ) 60 tablet 4  . cloNIDine (CATAPRES) 0.1 MG tablet TAKE 1 TABLET TWICE DAILY (Patient taking differently: Take 0.1 mg by mouth 2 (two) times daily. ) 60 tablet 4  . gabapentin (NEURONTIN) 100 MG capsule Take 1 capsule (100 mg total) by mouth 3 (three) times daily as needed (pain). 90 capsule 0  . hydrALAZINE (APRESOLINE) 100 MG tablet TAKE 1 TABLET BY MOUTH THREE TIMES DAILY (Patient taking differently: Take 100 mg by mouth 3 (three) times daily. ) 90 tablet 8  . ipratropium (ATROVENT) 0.06 % nasal spray 2 sprays per nostril 2 to 3 times daily as needed (Patient taking differently: Place 2 sprays into both nostrils 3 (three) times daily as needed for rhinitis. ) 15 mL 5  . lamivudine (EPIVIR) 100 MG tablet Take 1 tablet (100 mg total) by mouth daily.  90 tablet 2  . levocetirizine (XYZAL) 5 MG tablet Take 1 tablet (5 mg total) by mouth every evening. 30 tablet 11  . levothyroxine (SYNTHROID, LEVOTHROID) 25 MCG tablet TAKE 1 TABLET(25 MCG) BY MOUTH DAILY BEFORE BREAKFAST 30 tablet  0  . Olopatadine HCl (PAZEO) 0.7 % SOLN Place 1 drop into both eyes daily. (Patient taking differently: Place 1 drop into both eyes daily as needed (dry eyes). ) 1 Bottle 5  . potassium chloride SA (K-DUR,KLOR-CON) 20 MEQ tablet Take 1 tablet (20 mEq total) by mouth 2 (two) times daily. 180 tablet 2  . raltegravir (ISENTRESS) 400 MG tablet Take 1 tablet (400 mg total) by mouth 2 (two) times daily. 60 tablet 8  . torsemide (DEMADEX) 100 MG tablet Take 1 tablet (100 mg total) by mouth 2 (two) times daily. 60 tablet 3  . triamcinolone cream (KENALOG) 0.1 % Apply 1 application topically 2 (two) times daily. (Patient taking differently: Apply 1 application topically 2 (two) times daily as needed (skin irritation). ) 30 g 0  . zinc gluconate 50 MG tablet Take 100 mg by mouth daily.     No current facility-administered medications for this visit.     REVIEW OF SYSTEMS:  [X]  denotes positive finding, [ ]  denotes negative finding Cardiac  Comments:  Chest pain or chest pressure:    Shortness of breath upon exertion:    Short of breath when lying flat: x   Irregular heart rhythm:        Vascular    Pain in calf, thigh, or hip brought on by ambulation:    Pain in feet at night that wakes you up from your sleep:     Blood clot in your veins:    Leg swelling:         Pulmonary    Oxygen at home:    Productive cough:     Wheezing:         Neurologic    Sudden weakness in arms or legs:     Sudden numbness in arms or legs:  x   Sudden onset of difficulty speaking or slurred speech:    Temporary loss of vision in one eye:     Problems with dizziness:         Gastrointestinal    Blood in stool:     Vomited blood:         Genitourinary    Burning when urinating:      Blood in urine:        Psychiatric    Major depression:         Hematologic    Bleeding problems:    Problems with blood clotting too easily:    Anemia x   Skin    Rashes or ulcers:        Constitutional    Fever or chills:      PHYSICAL EXAM: Vitals:   06/15/18 0842 06/15/18 0846  BP: (!) 154/94 (!) 157/96  Pulse: 76   Resp: 20   SpO2: 98%   Weight: 107 kg   Height: 5' 10"  (1.778 m)     GENERAL: The patient is a well-nourished male, in no acute distress. The vital signs are documented above. CARDIAC: There is a regular rate and rhythm.  VASCULAR:  2+ brachial pulse papable BUE. 2+ radial pulse palpable BUE. No upper extremity tissue loss. Motor and sensory intact BUE. PULMONARY: There is good air exchange bilaterally without wheezing or rales. ABDOMEN: Soft and non-tender with normal pitched bowel sounds. Well healed laparoscopic cholecystectomy incisions. MUSCULOSKELETAL: There are no major deformities or cyanosis. NEUROLOGIC: No focal weakness or paresthesias are detected. SKIN: There are no ulcers or rashes noted. PSYCHIATRIC: The patient has a normal affect.  DATA:   I have independently reviewed his noninvasive imaging.  His upper extremity duplex study shows triphasic waveforms in bilateral brachial radial and ulnar arteries.  His vein mapping shows a nice cephalic vein in the right arm that is 3.3 mm at the wrist.  He also has a decent right basilic vein.  Unfortunately his left cephalic vein is small at the wrist and has another small segment in the mid upper arm.  His left basilic vein looks adequate.  Assessment/Plan:  52 year old male who is right-hand dominant that presents for AV fistula access evaluation in the setting of recent hospital admission for acute kidney injury superimposed on stage IV chronic kidney disease.  After review of his vein mapping patient would prefer a left arm fistula since he is right hand dominant.  Unfortunately his left  cephalic vein is small at the wrist and small in the upper arm.  I think he would be best served with a left brachiobasilic fistula.  We discussed that this can be done either in one stage or often in a two-stage approach.  I offered him the opportunity to schedule his fistula creation today, but he would prefer to talk to his nephrologist next week prior to making any decisions.  Patient states he is strongly leaning toward peritoneal dialysis at this time and is hesitant to schedule surgery until he can talk to his nephrologist.  Offered him the opportunity to call and schedule his surgery at any time whenever he decides to move forward.   Marty Heck, MD [facility] Office: [phone] Pager: [phone]

## 2018-06-30 ENCOUNTER — Ambulatory Visit (INDEPENDENT_AMBULATORY_CARE_PROVIDER_SITE_OTHER): Payer: Medicaid Other | Admitting: Physician Assistant

## 2018-06-30 ENCOUNTER — Other Ambulatory Visit: Payer: Self-pay

## 2018-06-30 ENCOUNTER — Encounter (HOSPITAL_COMMUNITY): Payer: Self-pay | Admitting: *Deleted

## 2018-06-30 ENCOUNTER — Encounter: Payer: Self-pay | Admitting: Physician Assistant

## 2018-06-30 VITALS — BP 128/88 | HR 64 | Ht 70.0 in | Wt 222.0 lb

## 2018-06-30 DIAGNOSIS — N184 Chronic kidney disease, stage 4 (severe): Secondary | ICD-10-CM

## 2018-06-30 DIAGNOSIS — I110 Hypertensive heart disease with heart failure: Secondary | ICD-10-CM | POA: Diagnosis not present

## 2018-06-30 DIAGNOSIS — I5032 Chronic diastolic (congestive) heart failure: Secondary | ICD-10-CM

## 2018-06-30 DIAGNOSIS — I130 Hypertensive heart and chronic kidney disease with heart failure and stage 1 through stage 4 chronic kidney disease, or unspecified chronic kidney disease: Secondary | ICD-10-CM

## 2018-06-30 DIAGNOSIS — R002 Palpitations: Secondary | ICD-10-CM

## 2018-06-30 NOTE — Patient Instructions (Addendum)
Medication Instructions:  Your physician recommends that you continue on your current medications as directed. Please refer to the Current Medication list given to you today.   Labwork: None ordered  Testing/Procedures: Your physician has recommended that you wear a holter monitor. Holter monitors are medical devices that record the heart's electrical activity. Doctors most often use these monitors to diagnose arrhythmias. Arrhythmias are problems with the speed or rhythm of the heartbeat. The monitor is a small, portable device. You can wear one while you do your normal daily activities. This is usually used to diagnose what is causing palpitations/syncope (passing out).    Follow-Up: Your physician wants you to follow-up in: 6 MONTHS WITH DR. Emelda Fear will receive a reminder letter in the mail two months in advance. If you don't receive a letter, please call our office to schedule the follow-up appointment.    Any Other Special Instructions Will Be Listed Below (If Applicable).  Holter Monitoring A Holter monitor is a small device that is used to detect abnormal heart rhythms. It clips to your clothing and is connected by wires to flat, sticky disks (electrodes) that attach to your chest. It is worn continuously for 24-48 hours. Follow these instructions at home:  Wear your Holter monitor at all times, even while exercising and sleeping, for as long as directed by your health care provider.  Make sure that the Holter monitor is safely clipped to your clothing or close to your body as recommended by your health care provider.  Do not get the monitor or wires wet.  Do not put body lotion or moisturizer on your chest.  Keep your skin clean.  Keep a diary of your daily activities, such as walking and doing chores. If you feel that your heartbeat is abnormal or that your heart is fluttering or skipping a beat: ? Record what you are doing when it happens. ? Record what time of day  the symptoms occur.  Return your Holter monitor as directed by your health care provider.  Keep all follow-up visits as directed by your health care provider. This is important. Get help right away if:  You feel lightheaded or you faint.  You have trouble breathing.  You feel pain in your chest, upper arm, or jaw.  You feel sick to your stomach and your skin is pale, cool, or damp.  You heartbeat feels unusual or abnormal. This information is not intended to replace advice given to you by your health care provider. Make sure you discuss any questions you have with your health care provider. Document Released: 07/11/2004 Document Revised: 03/20/2016 Document Reviewed: 05/22/2014 Elsevier Interactive Patient Education  [employer].     If you need a refill on your cardiac medications before your next appointment, please call your pharmacy.

## 2018-06-30 NOTE — Progress Notes (Signed)
Cardiology Office Note    Date:  06/30/2018   ID:  Albert Eaton, DOB 06-20-1966, MRN [id number]  PCP:  [name], FNP  Cardiologist:  Dr. [name]  Chief Complaint: 6 Months follow up  History of Present Illness:   Albert Eaton is a 52 y.o. male HTN, HLD, chronic diastolic CHF, CKD stage IV (followed by Dr. Hollie Salk). Mitral regurgitation, HIV and OSA on CPAP presents for months follow up.   He initially presented in December 2017 with hypertensive emergency and elevated troponin after transitioning to [place]. An echocardiogram demonstrated preserved LV function. The patient was managed medically.   He was doing well on cardiac stand point when last seen by me 12/2017.   Here for follow up. Diuretics (torsemide) managed by nephrologist.>> makes urine. Recently underwent vein mapping. Seen by nephrologist yesterday >>plan for fistula placement in near future. He has intermittent palpitations, occurring few minutes very day. No associated symptoms. No chest pain, sob, le edema, othopnea, PND or syncope.   Past Medical History:  Diagnosis Date  . Anemia   . Chronic diastolic CHF (congestive heart failure) ([place]) 01/06/2017   Echo 7/16 [facility] in [place], [name]) Mild AI, mild LAE, mild concentric LVH, EF 55, normal wall motion, mild to moderate MR, mild PI, RVSP 55 // Echo 10/09/16 ([facility]):  Moderate LVH, grade 2 diastolic dysfunction, mild MR, moderate LAE   . CKD (chronic kidney disease)    per pt, waiting on a kidney transplant in near future.  . Gout   . History of nuclear stress test    a. Nuc study 7/16: no scar or ischemia, EF 42 // b. Nuc study 12/17: EF 48, ?small apical ischemia, Low Risk  . HIV (human immunodeficiency virus infection) ([place])   . HLD (hyperlipidemia)   . Hypertension   . Hypertensive heart disease with CHF (congestive heart failure) ([place]) 10/09/2016  . OSA (obstructive sleep apnea)   . Post-operative nausea and vomiting    1 time as a child    . Sleep apnea    uses c-pap    Past Surgical History:  Procedure Laterality Date  . CHOLECYSTECTOMY    . GIVENS CAPSULE STUDY N/A 03/01/2018   Procedure: GIVENS CAPSULE STUDY;  Surgeon: Jerene Bears, MD;  Location: [place];  Service: Gastroenterology;  Laterality: N/A;  . HERNIA REPAIR     As baby  . NOSE SURGERY    . RENAL BIOPSY      Current Medications: Prior to Admission medications   Medication Sig Start Date End Date Taking? Authorizing Provider  abacavir (ZIAGEN) 300 MG tablet Take 1 tablet (300 mg total) by mouth daily. 03/16/18   Campbell Riches, MD  albuterol (PROAIR HFA) 108 (90 Base) MCG/ACT inhaler Inhale 2 puffs into the lungs every 4 (four) hours as needed for wheezing or shortness of breath. 02/09/18   Bobbitt, Sedalia Muta, MD  allopurinol (ZYLOPRIM) 100 MG tablet Take 0.5 tablets (50 mg total) by mouth 2 (two) times daily. 06/14/18   Rai, Ripudeep K, MD  amLODipine (NORVASC) 10 MG tablet Take 1 tablet (10 mg total) by mouth daily. 10/31/16   [name], FNP  azelastine (ASTELIN) 0.1 % nasal spray Place 2 sprays into both nostrils 2 (two) times daily as needed for rhinitis. Use in each nostril as directed 03/09/18   Bobbitt, Sedalia Muta, MD  carvedilol (COREG) 25 MG tablet TAKE 1 TABLET BY MOUTH TWICE DAILY WITH FOOD Patient taking differently: Take  25 mg by mouth 2 (two) times daily with a meal.  01/01/18   [name], MD  cloNIDine (CATAPRES) 0.1 MG tablet Take 1 tablet (0.1 mg total) by mouth 2 (two) times daily. 06/15/18   [name], MD  gabapentin (NEURONTIN) 100 MG capsule Take 1 capsule (100 mg total) by mouth 3 (three) times daily as needed (pain). 06/14/18   Rai, Ripudeep K, MD  hydrALAZINE (APRESOLINE) 100 MG tablet TAKE 1 TABLET BY MOUTH THREE TIMES DAILY Patient taking differently: Take 100 mg by mouth 3 (three) times daily.  05/17/18   [name], MD  ipratropium (ATROVENT) 0.06 % nasal spray 2 sprays per nostril 2 to 3 times daily as  needed Patient taking differently: Place 2 sprays into both nostrils 3 (three) times daily as needed for rhinitis.  02/09/18   Bobbitt, Sedalia Muta, MD  lamivudine (EPIVIR) 100 MG tablet Take 1 tablet (100 mg total) by mouth daily. 03/16/18   Campbell Riches, MD  levocetirizine (XYZAL) 5 MG tablet Take 1 tablet (5 mg total) by mouth every evening. 12/09/17   [name], FNP  levothyroxine (SYNTHROID, LEVOTHROID) 25 MCG tablet TAKE 1 TABLET(25 MCG) BY MOUTH DAILY BEFORE BREAKFAST 06/14/18   Lanae Boast, FNP  Olopatadine HCl (PAZEO) 0.7 % SOLN Place 1 drop into both eyes daily. Patient taking differently: Place 1 drop into both eyes daily as needed (dry eyes).  02/09/18   Bobbitt, Sedalia Muta, MD  potassium chloride SA (K-DUR,KLOR-CON) 20 MEQ tablet Take 1 tablet (20 mEq total) by mouth 2 (two) times daily. 03/24/18   Richardson Dopp T, PA-C  raltegravir (ISENTRESS) 400 MG tablet Take 1 tablet (400 mg total) by mouth 2 (two) times daily. 03/16/18   Campbell Riches, MD  torsemide (DEMADEX) 100 MG tablet Take 1 tablet (100 mg total) by mouth 2 (two) times daily. 06/15/18   Rai, Ripudeep K, MD  triamcinolone cream (KENALOG) 0.1 % Apply 1 application topically 2 (two) times daily. Patient taking differently: Apply 1 application topically 2 (two) times daily as needed (skin irritation).  07/13/17   [name], FNP  zinc gluconate 50 MG tablet Take 100 mg by mouth daily.    [provider]    Allergies:   Lisinopril and Ace inhibitors   Social History   Socioeconomic History  . Marital status: Single    Spouse name: Not on file  . Number of children: Not on file  . Years of education: Not on file  . Highest education level: Not on file  Occupational History  . Not on file  Social Needs  . Financial resource strain: Not on file  . Food insecurity:    Worry: Not on file    Inability: Not on file  . Transportation needs:    Medical: Not on file    Non-medical: Not on  file  Tobacco Use  . Smoking status: Former Smoker    Types: Cigarettes    Last attempt to quit: 2000    Years since quitting: 19.6  . Smokeless tobacco: Never Used  Substance and Sexual Activity  . Alcohol use: Yes    Alcohol/week: 2.0 standard drinks    Types: 2 Shots of liquor per week    Comment: Light use  . Drug use: No  . Sexual activity: Never    Partners: Male    Birth control/protection: Condom    Comment: declined condoms  Lifestyle  . Physical activity:    Days per week:  Not on file    Minutes per session: Not on file  . Stress: Not on file  Relationships  . Social connections:    Talks on phone: Not on file    Gets together: Not on file    Attends religious service: Not on file    Active member of club or organization: Not on file    Attends meetings of clubs or organizations: Not on file    Relationship status: Not on file  Other Topics Concern  . Not on file  Social History Narrative  . Not on file     Family History:  The patient's family history includes Heart attack (age of onset: 79) in his maternal grandmother; Heart failure in his mother; Hypertension in his brother, sister, and sister; Multiple sclerosis in his sister; Scoliosis in his other.   ROS:   Please see the history of present illness.    ROS All other systems reviewed and are negative.   PHYSICAL EXAM:   VS:  BP 128/88   Pulse 64   Ht 5' 10"  (1.778 m)   Wt 222 lb (100.7 kg)   BMI 31.85 kg/m    GEN: Well nourished, well developed, in no acute distress  HEENT: normal  Neck: no JVD, carotid bruits, or masses Cardiac:RRR; 1/6 systolic murmurs, rubs, or gallops,no edema  Respiratory:  clear to auscultation bilaterally, normal work of breathing GI: soft, nontender, nondistended, + BS MS: no deformity or atrophy  Skin: warm and dry, no rash Neuro:  Alert and Oriented x 3, Strength and sensation are intact Psych: euthymic mood, full affect  Wt Readings from Last 3 Encounters:    06/30/18 222 lb (100.7 kg)  06/15/18 236 lb (107 kg)  04/19/18 231 lb (104.8 kg)      Studies/Labs Reviewed:   EKG:  EKG is ordered today.  The ekg ordered today demonstrates NSR  Recent Labs: 11/19/2017: TSH 3.360 02/17/2018: ALT 11 06/13/2018: B Natriuretic Peptide 42.8; Hemoglobin 12.5; Magnesium 2.3; Platelets 289 06/14/2018: BUN 83; Creatinine, Ser 4.58; Potassium 3.3; Sodium 137   Lipid Panel    Component Value Date/Time   CHOL 127 02/17/2018 1059   CHOL 180 12/09/2017 0936   TRIG 102 02/17/2018 1059   HDL 35 (L) 02/17/2018 1059   HDL 34 (L) 12/09/2017 0936   CHOLHDL 3.6 02/17/2018 1059   VLDL 29 05/25/2017 0953   LDLCALC 73 02/17/2018 1059    Additional studies/ records that were reviewed today include:   Echocardiogram: 10/2017 Study Conclusions  - Left ventricle: The cavity size was normal. There was severe focal basal and moderate concentric hypertrophy. Systolic function was normal. The estimated ejection fraction was in the range of 60% to 65%. Wall motion was normal; there were no regional wall motion abnormalities. Features are consistent with a pseudonormal left ventricular filling pattern, with concomitant abnormal relaxation and increased filling pressure (grade 2 diastolic dysfunction). - Aortic valve: There was trivial regurgitation. - Aorta: Aortic root dimension: 39 mm (ED). Ascending aortic diameter: 40 mm (S). - Aortic root: The aortic root was mildly dilated. - Ascending aorta: The ascending aorta was mildly dilated. - Mitral valve: There was mild to moderate regurgitation directed eccentrically and posteriorly. - Left atrium: The atrium was severely dilated. - Right ventricle: The cavity size was moderately dilated. Wall thickness was normal. - Pulmonary arteries: PA peak pressure: 35 mm Hg (S).    ASSESSMENT & PLAN:    1. Chronic diastolic CHF - Euvolemic. Diuretics managed by  nephrologist.   2. Palpations - No  associated symptoms. Denies syncope. Get 48 hours monitor. Continue coreg at current dose.   3. CKD stage IV - plan for fistula placement later this week  4. hypertensive heart disease with CHF and CKD - Stable on current medications.     Medication Adjustments/Labs and Tests Ordered: Current medicines are reviewed at length with the patient today.  Concerns regarding medicines are outlined above.  Medication changes, Labs and Tests ordered today are listed in the Patient Instructions below. Patient Instructions  Medication Instructions:  Your physician recommends that you continue on your current medications as directed. Please refer to the Current Medication list given to you today.   Labwork: None ordered  Testing/Procedures: Your physician has recommended that you wear a holter monitor. Holter monitors are medical devices that record the heart's electrical activity. Doctors most often use these monitors to diagnose arrhythmias. Arrhythmias are problems with the speed or rhythm of the heartbeat. The monitor is a small, portable device. You can wear one while you do your normal daily activities. This is usually used to diagnose what is causing palpitations/syncope (passing out).    Follow-Up: Your physician wants you to follow-up in: 6 MONTHS WITH DR. Emelda Fear will receive a reminder letter in the mail two months in advance. If you don't receive a letter, please call our office to schedule the follow-up appointment.    Any Other Special Instructions Will Be Listed Below (If Applicable).  Holter Monitoring A Holter monitor is a small device that is used to detect abnormal heart rhythms. It clips to your clothing and is connected by wires to flat, sticky disks (electrodes) that attach to your chest. It is worn continuously for 24-48 hours. Follow these instructions at home:  Wear your Holter monitor at all times, even while exercising and sleeping, for as long as directed by  your health care provider.  Make sure that the Holter monitor is safely clipped to your clothing or close to your body as recommended by your health care provider.  Do not get the monitor or wires wet.  Do not put body lotion or moisturizer on your chest.  Keep your skin clean.  Keep a diary of your daily activities, such as walking and doing chores. If you feel that your heartbeat is abnormal or that your heart is fluttering or skipping a beat: ? Record what you are doing when it happens. ? Record what time of day the symptoms occur.  Return your Holter monitor as directed by your health care provider.  Keep all follow-up visits as directed by your health care provider. This is important. Get help right away if:  You feel lightheaded or you faint.  You have trouble breathing.  You feel pain in your chest, upper arm, or jaw.  You feel sick to your stomach and your skin is pale, cool, or damp.  You heartbeat feels unusual or abnormal. This information is not intended to replace advice given to you by your health care provider. Make sure you discuss any questions you have with your health care provider. Document Released: 07/11/2004 Document Revised: 03/20/2016 Document Reviewed: 05/22/2014 Elsevier Interactive Patient Education  [employer].     If you need a refill on your cardiac medications before your next appointment, please call your pharmacy.      Jarrett Soho, [place]  06/30/2018 10:38 AM    [facility] [place],  [place]  [address] Phone: [phone]; Fax: [phone]

## 2018-06-30 NOTE — Progress Notes (Signed)
Pt denies SOB and chest pain. Pt under the care of DR. [name], Cardiology. Pt denies having a cardiac cath. Pt made aware to stop taking vitamins, fish oil and herbal medications. Do not take any NSAIDs ie: Ibuprofen, Advil, Naproxen (Aleve), Motrin, BC and Goody Powder. Pt verbalized understanding of all pre-op instructions. Anesthesia asked to review cardiac note in Epic.

## 2018-07-01 NOTE — Progress Notes (Addendum)
Anesthesia Chart Review: SAME DAY WORKUP   Case:  [id number] Date/Time:  07/02/18 0715   Procedure:  ARTERIOVENOUS (AV) FISTULA CREATION UPPER EXTREMITY (Left )   Anesthesia type:  Monitor Anesthesia Care   Pre-op diagnosis:  chronic kidney disease   Location:  [facility] OR ROOM 11 / [place] [facility]   Surgeon:  Marty Heck, MD      DISCUSSION: 52 yo male former smoker for above procedure. Pertinent hx includes. PONV, Gout, OSA on CPAP, ESRD not yet on HD (Being eval'd for kidney transplant), HTN, Mitral regurg (mild-mod by Echo 2019), CHF (EF 60-65% with grade 2 dd by Echo 2019), HIV.  Pt recently hospitalized 8/18-8/19/2019 for AKI on CKD IV and hypokalemia secondary to dehydration. Potassium repleted 2.8>>3.3. Pt discharged in good condition with nephrology and vascular surgery followup.  Pt had cardiology followup 06/30/2018 with Leanor Kail, PA-C. At that appointment it was noted pt was stable from a cardiac standpoint and discussed that he would be having fistula placed later this week.  Anticipate he can proceed with surgery as planned  VS: There were no vitals taken for this visit.  PROVIDERS: [name], FNP is PCP  Corliss Parish, MD is Nephrologist  [name], MD is Cardiologist, last seen in office by Leanor Kail, PA-C 06/30/2018.  LABS: Will need DOS labs. Most recent labs displayed below. Results for WERNER, LABELLA (MRN [id number]) as of 07/01/2018 09:02  Ref. Range 06/14/2018 62:22  BASIC METABOLIC PANEL Unknown Rpt (A)  Sodium Latest Ref Range: 135 - 145 mmol/L 137  Potassium Latest Ref Range: 3.5 - 5.1 mmol/L 3.3 (L)  Chloride Latest Ref Range: 98 - 111 mmol/L 98  CO2 Latest Ref Range: 22 - 32 mmol/L 28  Glucose Latest Ref Range: 70 - 99 mg/dL 117 (H)  BUN Latest Ref Range: 6 - 20 mg/dL 83 (H)  Creatinine Latest Ref Range: 0.61 - 1.24 mg/dL 4.58 (H)  Calcium Latest Ref Range: 8.9 - 10.3 mg/dL 9.6  Anion gap Latest Ref Range: 5 - 15  11  GFR,  Est Non African American Latest Ref Range: >60 mL/min 13 (L)  GFR, Est African American Latest Ref Range: >60 mL/min 16 (L)    IMAGES: EXAM: XR CHEST 2 VIEWS 01/25/2018 (care everywhere): CLINICAL INDICATION: 52 years old Male with KIDNEY TRANSPLANT-Z01.818-Pre-transplant evaluation for end stage renal disease  COMPARISON: None  TECHNIQUE: PA and Lateral Chest Radiographs.  FINDINGS:   Radiographically clear lungs.  No pleural effusion or pneumothorax.  Unremarkable cardiomediastinal silhouette.  EKG: 06/30/2018: NSR, Prolonged QT/QTc (478/493). No significant changes compared to previous.  CV: NM Myocardial Perfusion 01/25/2018 (care everywhere): Nuclear Perfusion Findings: Comparison of the stress and rest imaging revealed homogeneous  radioisotope tracer uptake with no stress-induced perfusion abnormalities.  Nuclear Wall Motion Findings: Post stress:Global systolic function is normal.The ejection fraction  calculated at 62%.   Echocardiogram:10/2017 Study Conclusions  - Left ventricle: The cavity size was normal. There was severe focal basal and moderate concentric hypertrophy. Systolic function was normal. The estimated ejection fraction was in the range of 60% to 65%. Wall motion was normal; there were no regional wall motion abnormalities. Features are consistent with a pseudonormal left ventricular filling pattern, with concomitant abnormal relaxation and increased filling pressure (grade 2 diastolic dysfunction). - Aortic valve: There was trivial regurgitation. - Aorta: Aortic root dimension: 39 mm (ED). Ascending aortic diameter: 40 mm (S). - Aortic root: The aortic root was mildly dilated. - Ascending aorta: The ascending aorta  was mildly dilated. - Mitral valve: There was mild to moderate regurgitation directed eccentrically and posteriorly. - Left atrium: The atrium was severely dilated. - Right ventricle: The cavity size was  moderately dilated. Wall thickness was normal. - Pulmonary arteries: PA peak pressure: 35 mm Hg (S).  NM Stress Test 10/10/2016:  Slight accentuation of the ST/T wave abnormality in the inferolateral leads during infusion  There is a subtle small defect of mild severity present in the apex location. The defect is reversible and could represent a very small area of ischemia.  This is a low risk study.  The left ventricular ejection fraction is mildly decreased (45-54%).  Nuclear stress EF: 48%.   Past Medical History:  Diagnosis Date  . Anemia   . Chronic diastolic CHF (congestive heart failure) ([place]) 01/06/2017   Echo 7/16 [facility] in [place], [name]) Mild AI, mild LAE, mild concentric LVH, EF 55, normal wall motion, mild to moderate MR, mild PI, RVSP 55 // Echo 10/09/16 ([facility]):  Moderate LVH, grade 2 diastolic dysfunction, mild MR, moderate LAE   . CKD (chronic kidney disease)    per pt, waiting on a kidney transplant in near future.  . Gout   . Hepatitis    Hep B  . History of nuclear stress test    a. Nuc study 7/16: no scar or ischemia, EF 42 // b. Nuc study 12/17: EF 48, ?small apical ischemia, Low Risk  . HIV (human immunodeficiency virus infection) ([place])   . HLD (hyperlipidemia)   . Hypertension   . Hypertensive heart disease with CHF (congestive heart failure) ([place]) 10/09/2016  . OSA (obstructive sleep apnea)   . Post-operative nausea and vomiting    1 time as a child  . Sleep apnea    uses c-pap  . Wears glasses   . Wears partial dentures     Past Surgical History:  Procedure Laterality Date  . CHOLECYSTECTOMY    . COLONOSCOPY W/ BIOPSIES AND POLYPECTOMY    . GIVENS CAPSULE STUDY N/A 03/01/2018   Procedure: GIVENS CAPSULE STUDY;  Surgeon: Jerene Bears, MD;  Location: [facility];  Service: Gastroenterology;  Laterality: N/A;  . HERNIA REPAIR     As baby  . MULTIPLE TOOTH EXTRACTIONS    . NOSE SURGERY    . RENAL BIOPSY      MEDICATIONS: No  current facility-administered medications for this encounter.    Marland Kitchen abacavir (ZIAGEN) 300 MG tablet  . albuterol (PROAIR HFA) 108 (90 Base) MCG/ACT inhaler  . allopurinol (ZYLOPRIM) 100 MG tablet  . amLODipine (NORVASC) 10 MG tablet  . azelastine (ASTELIN) 0.1 % nasal spray  . carvedilol (COREG) 25 MG tablet  . cloNIDine (CATAPRES) 0.1 MG tablet  . gabapentin (NEURONTIN) 100 MG capsule  . hydrALAZINE (APRESOLINE) 100 MG tablet  . ipratropium (ATROVENT) 0.06 % nasal spray  . lamivudine (EPIVIR) 100 MG tablet  . levocetirizine (XYZAL) 5 MG tablet  . levothyroxine (SYNTHROID, LEVOTHROID) 25 MCG tablet  . Olopatadine HCl (PAZEO) 0.7 % SOLN  . potassium chloride SA (K-DUR,KLOR-CON) 20 MEQ tablet  . raltegravir (ISENTRESS) 400 MG tablet  . torsemide (DEMADEX) 100 MG tablet  . triamcinolone cream (KENALOG) 0.1 %  . zinc gluconate 50 MG tablet    Wynonia Musty [facility] Short Stay Center/Anesthesiology Phone [phone] 07/01/2018 9:24 AM

## 2018-07-02 ENCOUNTER — Other Ambulatory Visit: Payer: Self-pay

## 2018-07-02 ENCOUNTER — Ambulatory Visit (HOSPITAL_COMMUNITY): Payer: Medicaid Other | Admitting: Physician Assistant

## 2018-07-02 ENCOUNTER — Telehealth: Payer: Self-pay | Admitting: Vascular Surgery

## 2018-07-02 ENCOUNTER — Encounter (HOSPITAL_COMMUNITY): Payer: Self-pay | Admitting: *Deleted

## 2018-07-02 ENCOUNTER — Ambulatory Visit: Payer: Medicaid Other | Admitting: Family Medicine

## 2018-07-02 ENCOUNTER — Encounter (HOSPITAL_COMMUNITY)
Admission: RE | Disposition: A | Discharge status: Home / Self Care | Payer: Self-pay | Source: Ambulatory Visit | Attending: Vascular Surgery

## 2018-07-02 ENCOUNTER — Ambulatory Visit (HOSPITAL_COMMUNITY)
Admission: RE | Admit: 2018-07-02 | Discharge: 2018-07-02 | Disposition: A | Discharge status: Home / Self Care | Payer: Medicaid Other | Source: Ambulatory Visit | Attending: Vascular Surgery | Admitting: Vascular Surgery

## 2018-07-02 DIAGNOSIS — Z8249 Family history of ischemic heart disease and other diseases of the circulatory system: Secondary | ICD-10-CM | POA: Insufficient documentation

## 2018-07-02 DIAGNOSIS — I5032 Chronic diastolic (congestive) heart failure: Secondary | ICD-10-CM | POA: Insufficient documentation

## 2018-07-02 DIAGNOSIS — Z9989 Dependence on other enabling machines and devices: Secondary | ICD-10-CM | POA: Insufficient documentation

## 2018-07-02 DIAGNOSIS — E785 Hyperlipidemia, unspecified: Secondary | ICD-10-CM | POA: Insufficient documentation

## 2018-07-02 DIAGNOSIS — G4733 Obstructive sleep apnea (adult) (pediatric): Secondary | ICD-10-CM | POA: Diagnosis not present

## 2018-07-02 DIAGNOSIS — Z87891 Personal history of nicotine dependence: Secondary | ICD-10-CM | POA: Diagnosis not present

## 2018-07-02 DIAGNOSIS — Z79899 Other long term (current) drug therapy: Secondary | ICD-10-CM | POA: Insufficient documentation

## 2018-07-02 DIAGNOSIS — M109 Gout, unspecified: Secondary | ICD-10-CM | POA: Diagnosis not present

## 2018-07-02 DIAGNOSIS — Z888 Allergy status to other drugs, medicaments and biological substances status: Secondary | ICD-10-CM | POA: Diagnosis not present

## 2018-07-02 DIAGNOSIS — E039 Hypothyroidism, unspecified: Secondary | ICD-10-CM | POA: Diagnosis not present

## 2018-07-02 DIAGNOSIS — I130 Hypertensive heart and chronic kidney disease with heart failure and stage 1 through stage 4 chronic kidney disease, or unspecified chronic kidney disease: Secondary | ICD-10-CM | POA: Insufficient documentation

## 2018-07-02 DIAGNOSIS — Z21 Asymptomatic human immunodeficiency virus [HIV] infection status: Secondary | ICD-10-CM | POA: Diagnosis not present

## 2018-07-02 DIAGNOSIS — N184 Chronic kidney disease, stage 4 (severe): Secondary | ICD-10-CM | POA: Insufficient documentation

## 2018-07-02 HISTORY — DX: Presence of spectacles and contact lenses: Z97.3

## 2018-07-02 HISTORY — PX: AV FISTULA PLACEMENT: SHX1204

## 2018-07-02 HISTORY — DX: Inflammatory liver disease, unspecified: K75.9

## 2018-07-02 HISTORY — DX: Presence of dental prosthetic device (complete) (partial): Z97.2

## 2018-07-02 LAB — POCT I-STAT 4, (NA,K, GLUC, HGB,HCT)
GLUCOSE: 105 mg/dL — AB (ref 70–99)
HCT: 28 % — ABNORMAL LOW (ref 39.0–52.0)
HEMOGLOBIN: 9.5 g/dL — AB (ref 13.0–17.0)
POTASSIUM: 3.1 mmol/L — AB (ref 3.5–5.1)
Sodium: 140 mmol/L (ref 135–145)

## 2018-07-02 SURGERY — ARTERIOVENOUS (AV) FISTULA CREATION
Anesthesia: Monitor Anesthesia Care | Site: Arm Lower | Laterality: Left

## 2018-07-02 MED ORDER — LIDOCAINE-EPINEPHRINE (PF) 1 %-1:200000 IJ SOLN
INTRAMUSCULAR | Status: DC | PRN
  Administered 2018-07-02: 30 mL

## 2018-07-02 MED ORDER — CHLORHEXIDINE GLUCONATE 4 % EX LIQD: 60.0000 mL | Freq: Once | CUTANEOUS | Status: DC

## 2018-07-02 MED ORDER — 0.9 % SODIUM CHLORIDE (POUR BTL) OPTIME
TOPICAL | Status: DC | PRN
  Administered 2018-07-02: 1000 mL

## 2018-07-02 MED ORDER — PROPOFOL 500 MG/50ML IV EMUL
INTRAVENOUS | Status: DC | PRN
  Administered 2018-07-02: 50 ug/kg/min via INTRAVENOUS

## 2018-07-02 MED ORDER — FENTANYL CITRATE (PF) 100 MCG/2ML IJ SOLN
INTRAMUSCULAR | Status: DC | PRN
  Administered 2018-07-02 (×2): 25 ug via INTRAVENOUS
  Administered 2018-07-02: 50 ug via INTRAVENOUS

## 2018-07-02 MED ORDER — DEXAMETHASONE SODIUM PHOSPHATE 10 MG/ML IJ SOLN
INTRAMUSCULAR | Status: DC | PRN
  Administered 2018-07-02: 10 mg via INTRAVENOUS

## 2018-07-02 MED ORDER — SODIUM CHLORIDE 0.9 % IV SOLN
INTRAVENOUS | Status: AC
  Filled 2018-07-02: qty 1.2

## 2018-07-02 MED ORDER — DEXAMETHASONE SODIUM PHOSPHATE 10 MG/ML IJ SOLN
INTRAMUSCULAR | Status: AC
  Filled 2018-07-02: qty 1

## 2018-07-02 MED ORDER — SODIUM CHLORIDE 0.9 % IV SOLN
INTRAVENOUS | Status: DC
  Administered 2018-07-02: 07:00:00 via INTRAVENOUS

## 2018-07-02 MED ORDER — CEFAZOLIN SODIUM-DEXTROSE 2-3 GM-%(50ML) IV SOLR
INTRAVENOUS | Status: DC | PRN
  Administered 2018-07-02: 2 g via INTRAVENOUS

## 2018-07-02 MED ORDER — EPHEDRINE 5 MG/ML INJ
INTRAVENOUS | Status: AC
  Filled 2018-07-02: qty 10

## 2018-07-02 MED ORDER — OXYCODONE-ACETAMINOPHEN 5-325 MG PO TABS: 1.0000 | ORAL_TABLET | Freq: Four times a day (QID) | ORAL | 0 refills | Status: DC | PRN

## 2018-07-02 MED ORDER — ONDANSETRON HCL 4 MG/2ML IJ SOLN: 4.0000 mg | Freq: Once | INTRAMUSCULAR | Status: DC | PRN

## 2018-07-02 MED ORDER — LIDOCAINE 2% (20 MG/ML) 5 ML SYRINGE
INTRAMUSCULAR | Status: AC
  Filled 2018-07-02: qty 10

## 2018-07-02 MED ORDER — HEPARIN SODIUM (PORCINE) 1000 UNIT/ML IJ SOLN
INTRAMUSCULAR | Status: DC | PRN
  Administered 2018-07-02: 3000 [IU] via INTRAVENOUS

## 2018-07-02 MED ORDER — SUCCINYLCHOLINE CHLORIDE 200 MG/10ML IV SOSY
PREFILLED_SYRINGE | INTRAVENOUS | Status: AC
  Filled 2018-07-02: qty 10

## 2018-07-02 MED ORDER — PROPOFOL 10 MG/ML IV BOLUS
INTRAVENOUS | Status: AC
  Filled 2018-07-02: qty 20

## 2018-07-02 MED ORDER — ONDANSETRON HCL 4 MG/2ML IJ SOLN
INTRAMUSCULAR | Status: AC
  Filled 2018-07-02: qty 2

## 2018-07-02 MED ORDER — FENTANYL CITRATE (PF) 250 MCG/5ML IJ SOLN
INTRAMUSCULAR | Status: AC
  Filled 2018-07-02: qty 5

## 2018-07-02 MED ORDER — PHENYLEPHRINE 40 MCG/ML (10ML) SYRINGE FOR IV PUSH (FOR BLOOD PRESSURE SUPPORT)
PREFILLED_SYRINGE | INTRAVENOUS | Status: AC
  Filled 2018-07-02: qty 10

## 2018-07-02 MED ORDER — CEFAZOLIN SODIUM-DEXTROSE 2-4 GM/100ML-% IV SOLN: 2.0000 g | INTRAVENOUS | Status: DC

## 2018-07-02 MED ORDER — SODIUM CHLORIDE 0.9 % IV SOLN
INTRAVENOUS | Status: DC | PRN
  Administered 2018-07-02: 500 mL

## 2018-07-02 MED ORDER — ROCURONIUM BROMIDE 50 MG/5ML IV SOSY
PREFILLED_SYRINGE | INTRAVENOUS | Status: AC
  Filled 2018-07-02: qty 5

## 2018-07-02 MED ORDER — SODIUM CHLORIDE 0.9 % IV SOLN
INTRAVENOUS | Status: DC | PRN
  Administered 2018-07-02: 40 ug/min via INTRAVENOUS

## 2018-07-02 MED ORDER — CEFAZOLIN SODIUM-DEXTROSE 2-4 GM/100ML-% IV SOLN
INTRAVENOUS | Status: AC
  Filled 2018-07-02: qty 100

## 2018-07-02 MED ORDER — MIDAZOLAM HCL 5 MG/5ML IJ SOLN
INTRAMUSCULAR | Status: DC | PRN
  Administered 2018-07-02 (×2): 1 mg via INTRAVENOUS

## 2018-07-02 MED ORDER — FENTANYL CITRATE (PF) 100 MCG/2ML IJ SOLN: 25.0000 ug | INTRAMUSCULAR | Status: DC | PRN

## 2018-07-02 MED ORDER — LIDOCAINE-EPINEPHRINE (PF) 1 %-1:200000 IJ SOLN
INTRAMUSCULAR | Status: AC
  Filled 2018-07-02: qty 30

## 2018-07-02 MED ORDER — MIDAZOLAM HCL 2 MG/2ML IJ SOLN
INTRAMUSCULAR | Status: AC
  Filled 2018-07-02: qty 2

## 2018-07-02 SURGICAL SUPPLY — 40 items
ARMBAND PINK RESTRICT EXTREMIT (MISCELLANEOUS) ×4 IMPLANT
CANISTER SUCT 3000ML PPV (MISCELLANEOUS) ×2 IMPLANT
CLIP VESOCCLUDE MED 6/CT (CLIP) ×2 IMPLANT
CLIP VESOCCLUDE SM WIDE 6/CT (CLIP) ×2 IMPLANT
COVER PROBE W GEL 5X96 (DRAPES) ×2 IMPLANT
DECANTER SPIKE VIAL GLASS SM (MISCELLANEOUS) ×2 IMPLANT
DERMABOND ADVANCED (GAUZE/BANDAGES/DRESSINGS) ×1
DERMABOND ADVANCED .7 DNX12 (GAUZE/BANDAGES/DRESSINGS) ×1 IMPLANT
DRAPE HALF SHEET 40X57 (DRAPES) ×2 IMPLANT
ELECT REM PT RETURN 9FT ADLT (ELECTROSURGICAL) ×2
ELECTRODE REM PT RTRN 9FT ADLT (ELECTROSURGICAL) ×1 IMPLANT
GLOVE BIO SURGEON STRL SZ 6.5 (GLOVE) ×4 IMPLANT
GLOVE BIO SURGEON STRL SZ7.5 (GLOVE) ×2 IMPLANT
GLOVE BIOGEL PI IND STRL 7.0 (GLOVE) ×1 IMPLANT
GLOVE BIOGEL PI IND STRL 7.5 (GLOVE) ×1 IMPLANT
GLOVE BIOGEL PI IND STRL 8 (GLOVE) ×1 IMPLANT
GLOVE BIOGEL PI INDICATOR 7.0 (GLOVE) ×1
GLOVE BIOGEL PI INDICATOR 7.5 (GLOVE) ×1
GLOVE BIOGEL PI INDICATOR 8 (GLOVE) ×1
GLOVE SS BIOGEL STRL SZ 7 (GLOVE) ×1 IMPLANT
GLOVE SUPERSENSE BIOGEL SZ 7 (GLOVE) ×1
GOWN STRL REUS W/ TWL LRG LVL3 (GOWN DISPOSABLE) ×2 IMPLANT
GOWN STRL REUS W/ TWL XL LVL3 (GOWN DISPOSABLE) ×1 IMPLANT
GOWN STRL REUS W/TWL LRG LVL3 (GOWN DISPOSABLE) ×2
GOWN STRL REUS W/TWL XL LVL3 (GOWN DISPOSABLE) ×1
HEMOSTAT SPONGE AVITENE ULTRA (HEMOSTASIS) IMPLANT
KIT BASIN OR (CUSTOM PROCEDURE TRAY) ×2 IMPLANT
KIT TURNOVER KIT B (KITS) ×2 IMPLANT
NS IRRIG 1000ML POUR BTL (IV SOLUTION) ×2 IMPLANT
PACK CV ACCESS (CUSTOM PROCEDURE TRAY) ×2 IMPLANT
PAD ARMBOARD 7.5X6 YLW CONV (MISCELLANEOUS) ×4 IMPLANT
SUT MNCRL AB 4-0 PS2 18 (SUTURE) ×2 IMPLANT
SUT PROLENE 6 0 BV (SUTURE) ×4 IMPLANT
SUT PROLENE 7 0 BV 1 (SUTURE) IMPLANT
SUT VIC AB 3-0 SH 27 (SUTURE) ×1
SUT VIC AB 3-0 SH 27X BRD (SUTURE) ×1 IMPLANT
TOWEL GREEN STERILE (TOWEL DISPOSABLE) ×2 IMPLANT
TUBE CONNECTING 20X1/4 (TUBING) ×2 IMPLANT
UNDERPAD 30X30 (UNDERPADS AND DIAPERS) ×2 IMPLANT
WATER STERILE IRR 1000ML POUR (IV SOLUTION) ×2 IMPLANT

## 2018-07-02 NOTE — Op Note (Signed)
OPERATIVE NOTE   PROCEDURE: 1. Left first stage basilic vein transposition (brachiobasilic arteriovenous fistula) placement  PRE-OPERATIVE DIAGNOSIS: Stage IV CKD   POST-OPERATIVE DIAGNOSIS: Stage IV CKD   SURGEON: Marty Heck, MD  ASSISTANT(S): Leontine Locket, PA  ANESTHESIA: MAC  ESTIMATED BLOOD LOSS: Minimal  FINDING(S): 1.  Basilic vein: 4 mm, acceptable 2.  Brachial artery: 4 mm, disease free 3.  Venous outflow: palpable thrill  4.  Radial flow: palpable radial pulse  SPECIMEN(S):  none  INDICATIONS:   Albert Eaton is a 52 y.o. male who presents with Stage IV CKD.  The patient is scheduled for left first stage basilic vein transposition.  The patient is aware the risks include but are not limited to: bleeding, infection, steal syndrome, nerve damage, ischemic monomelic neuropathy, failure to mature, and need for additional procedures.  The patient is aware of the risks of the procedure and elects to proceed forward.   DESCRIPTION: After full informed written consent was obtained from the patient, the patient was brought back to the operating room and placed supine upon the operating table.  Prior to induction, the patient received IV antibiotics.   After obtaining adequate anesthesia, the patient was then prepped and draped in the standard fashion for a left arm access procedure.  I initially evaluated the cephalic vein in the left upper arm.  Unfortunately this appeared small at antecubitum and had another segment about 2.2 mm in the mid upper arm.  I did not feel this was a long-term solution for him.  I then turned my attention to identifying the patient's basilic vein and brachial artery.  The basilic vein appeared to be about 4 mm.  Using SonoSite guidance, the location of these vessels were marked out on the skin.   At this point, I injected local anesthetic to obtain a field block of the antecubitum.  In total, I injected about 10 mL of 1% lidocaine  without epinephrine.  I made a transverse incision at the level of the antecubitum and dissected through the subcutaneous tissue and fascia to gain exposure of the brachial artery.  This was noted to be 4 mm in diameter externally.  This was dissected out proximally and distally and controlled with vessel loops .  I then dissected out the basilic vein.  This was noted to be 4 mm in diameter externally.  The vein was marked externally.  The distal segment of the vein was ligated with a  2-0 silk and vessel clip, and the vein was transected.  The proximal segment was interrogated with serial dilators.  The vein accepted up to a 4 mm dilator without any difficulty.  I then instilled the heparinized saline into the vein and clamped it.  At this point, I reset my exposure of the brachial artery and placed the artery under tension proximally and distally.  I made an arteriotomy with a #11 blade, and then I extended the arteriotomy with a Potts scissor.  The arteriotomy was only 5 mm.  I injected heparinized saline proximal and distal to this arteriotomy.  The vein was then sewn to the artery in an end-to-side configuration with a running stitch of 6-0 Prolene.  Prior to completing this anastomosis, I allowed the vein and artery to backbleed.  There was no evidence of clot from any vessels.  I completed the anastomosis in the usual fashion and then released all vessel loops and clamps.    There was a palpable thrill in the  venous outflow, and there was a palpable radial pulse.  At this point, I irrigated out the surgical wound.  There was no further active bleeding.  The subcutaneous tissue was reapproximated with a running stitch of 3-0 Vicryl.  The skin was then reapproximated with a running subcuticular stitch of 4-0 Monocryl.  The skin was then cleaned, dried, and reinforced with Dermabond.  The patient tolerated this procedure well.    COMPLICATIONS: None  CONDITION: Stable   Marty Heck  MD [facility] Office: [phone] Pager: [phone]  07/02/2018, 9:00 AM   Marty Heck

## 2018-07-02 NOTE — Telephone Encounter (Signed)
sch appt spk to pt 08/10/18 8am Dialysis Duplex 9am p/o MD

## 2018-07-02 NOTE — Anesthesia Postprocedure Evaluation (Signed)
Anesthesia Post Note  Patient: Albert Eaton  Procedure(s) Performed: Creation of Left arm BRACHIOBASILIC ARTERIOVENOUS  FISTULA (Left Arm Lower)     Patient location during evaluation: PACU Anesthesia Type: MAC Level of consciousness: awake and alert Pain management: pain level controlled Vital Signs Assessment: post-procedure vital signs reviewed and stable Respiratory status: spontaneous breathing, nonlabored ventilation, respiratory function stable and patient connected to nasal cannula oxygen Cardiovascular status: stable and blood pressure returned to baseline Postop Assessment: no apparent nausea or vomiting Anesthetic complications: no    Last Vitals:  Vitals:   07/02/18 0930 07/02/18 0934  BP: 105/75 113/79  Pulse: 63 62  Resp: 13   Temp: 36.7 C   SpO2: 94% 96%    Last Pain:  Vitals:   07/02/18 0930  TempSrc:   PainSc: 0-No pain                 Tiajuana Amass

## 2018-07-02 NOTE — Anesthesia Preprocedure Evaluation (Addendum)
Anesthesia Evaluation  Patient identified by MRN, date of birth, ID band Patient awake    Reviewed: Allergy & Precautions, NPO status , Patient's Chart, lab work & pertinent test results, reviewed documented beta blocker date and time   History of Anesthesia Complications (+) PONV  Airway Mallampati: II  TM Distance: >3 FB Neck ROM: Full    Dental  (+) Dental Advisory Given   Pulmonary sleep apnea , former smoker,    breath sounds clear to auscultation       Cardiovascular hypertension, Pt. on medications and Pt. on home beta blockers +CHF   Rhythm:Regular Rate:Normal     Neuro/Psych negative neurological ROS     GI/Hepatic negative GI ROS,   Endo/Other  Hypothyroidism   Renal/GU Renal disease     Musculoskeletal   Abdominal   Peds  Hematology  (+) anemia , HIV,   Anesthesia Other Findings   Reproductive/Obstetrics                            Lab Results  Component Value Date   WBC 7.0 06/13/2018   HGB 9.5 (L) 07/02/2018   HCT 28.0 (L) 07/02/2018   MCV 90.9 06/13/2018   PLT 289 06/13/2018   Lab Results  Component Value Date   CREATININE 4.58 (H) 06/14/2018   BUN 83 (H) 06/14/2018   NA 140 07/02/2018   K 3.1 (L) 07/02/2018   CL 98 06/14/2018   CO2 28 06/14/2018    Anesthesia Physical Anesthesia Plan  ASA: III  Anesthesia Plan: MAC   Post-op Pain Management:    Induction: Intravenous  PONV Risk Score and Plan: 2 and Propofol infusion, Ondansetron and Treatment may vary due to age or medical condition  Airway Management Planned: Natural Airway and Simple Face Mask  Additional Equipment:   Intra-op Plan:   Post-operative Plan:   Informed Consent: I have reviewed the patients History and Physical, chart, labs and discussed the procedure including the risks, benefits and alternatives for the proposed anesthesia with the patient or authorized representative who has  indicated his/her understanding and acceptance.   Dental advisory given  Plan Discussed with: CRNA  Anesthesia Plan Comments:         Anesthesia Quick Evaluation

## 2018-07-02 NOTE — Transfer of Care (Signed)
Immediate Anesthesia Transfer of Care Note  Patient: Albert Eaton  Procedure(s) Performed: Creation of Left arm BRACHIOBASILIC ARTERIOVENOUS  FISTULA (Left Arm Lower)  Patient Location: PACU  Anesthesia Type:MAC  Level of Consciousness: awake, alert  and oriented  Airway & Oxygen Therapy: Patient Spontanous Breathing  Post-op Assessment: Report given to RN and Post -op Vital signs reviewed and stable  Post vital signs: Reviewed and stable  Last Vitals:  Vitals Value Taken Time  BP 102/78 07/02/2018  9:09 AM  Temp    Pulse 66 07/02/2018  9:09 AM  Resp 13 07/02/2018  9:09 AM  SpO2 96 % 07/02/2018  9:09 AM  Vitals shown include unvalidated device data.  Last Pain:  Vitals:   07/02/18 0606  TempSrc:   PainSc: 0-No pain      Patients Stated Pain Goal: 4 (03/52/48 1859)  Complications: No apparent anesthesia complications

## 2018-07-02 NOTE — Discharge Instructions (Signed)
° °  [facility]  Discharge Instructions  AV Fistula or Graft Surgery for Dialysis Access  Please refer to the following instructions for your post-procedure care. Your surgeon or physician assistant will discuss any changes with you.  Activity  You may drive the day following your surgery, if you are comfortable and no longer taking prescription pain medication. Resume full activity as the soreness in your incision resolves.  Bathing/Showering  You may shower after you go home. Keep your incision dry for 48 hours. Do not soak in a bathtub, hot tub, or swim until the incision heals completely. You may not shower if you have a hemodialysis catheter.  Incision Care  Clean your incision with mild soap and water after 48 hours. Pat the area dry with a clean towel. You do not need a bandage unless otherwise instructed. Do not apply any ointments or creams to your incision. You may have skin glue on your incision. Do not peel it off. It will come off on its own in about one week. Your arm may swell a bit after surgery. To reduce swelling use pillows to elevate your arm so it is above your heart. Your doctor will tell you if you need to lightly wrap your arm with an ACE bandage.  Diet  Resume your normal diet. There are not special food restrictions following this procedure. In order to heal from your surgery, it is CRITICAL to get adequate nutrition. Your body requires vitamins, minerals, and protein. Vegetables are the best source of vitamins and minerals. Vegetables also provide the perfect balance of protein. Processed food has little nutritional value, so try to avoid this.  Medications  Resume taking all of your medications. If your incision is causing pain, you may take over-the counter pain relievers such as acetaminophen (Tylenol). If you were prescribed a stronger pain medication, please be aware these medications can cause nausea and constipation. Prevent  nausea by taking the medication with a snack or meal. Avoid constipation by drinking plenty of fluids and eating foods with high amount of fiber, such as fruits, vegetables, and grains.  Do not take Tylenol if you are taking prescription pain medications.  Follow up Your surgeon may want to see you in the office following your access surgery. If so, this will be arranged at the time of your surgery.  Please call us immediately for any of the following conditions:  Increased pain, redness, drainage (pus) from your incision site Fever of 101 degrees or higher Severe or worsening pain at your incision site Hand pain or numbness.  Reduce your risk of vascular disease:  Stop smoking. If you would like help, call [facility] at 1-800-QUIT-NOW 713-448-1069) or [place] at [place] your cholesterol Maintain a desired weight Control your diabetes Keep your blood pressure down  Dialysis  It will take several weeks to several months for your new dialysis access to be ready for use. Your surgeon will determine when it is okay to use it. Your nephrologist will continue to direct your dialysis. You can continue to use your Permcath until your new access is ready for use.   07/02/2018 Albert Eaton [id number] [birth year]  Surgeon(s): Marty Heck, MD  Procedure(s): Creation of Left arm BRACHIOBASILIC ARTERIOVENOUS  FISTULA  x Do not stick fistula for 12 weeks    If you have any questions, please call the office at [phone].

## 2018-07-02 NOTE — H&P (Signed)
History and Physical Interval Note:  07/02/2018 7:21 AM  Albert Eaton  has presented today for surgery, with the diagnosis of chronic kidney disease  The various methods of treatment have been discussed with the patient and family. After consideration of risks, benefits and other options for treatment, the patient has consented to  Procedure(s): ARTERIOVENOUS (AV) FISTULA CREATION UPPER EXTREMITY (Left) as a surgical intervention .  The patient's history has been reviewed, patient examined, no change in status, stable for surgery.  I have reviewed the patient's chart and labs.  Questions were answered to the patient's satisfaction.     Left upper extremity AVF.  Marty Heck  Patient name: Albert Eaton        MRN: [id number]        DOB: [birth year]            Sex: male  REASON FOR CONSULT: AV fistula placement  HPI: Albert Eaton is a 52 y.o. male with history of congestive heart failure, HIV, hypertension, hyperlipidemia, and acute on chronic kidney failure (CKD stage IV) that presents for dialysis access evaluation.  Patient states he has never been on dialysis before.  He has never had a catheter before.  He is not currently dialyzing but was recently hospitalized for dehydration from diarrhea with acute on chronic kidney issues.  He is right-hand dominant although he does not work at this time.  He denies tobacco abuse.  No previous issues with either upper arm including pain, paresthesia, tissue loss, etc.  He seems more interested in peritoneal dialysis in our discussion today.  He has only ever had a lap  cholecystectomy before.      Past Medical History:  Diagnosis Date  . Anemia   . Chronic diastolic CHF (congestive heart failure) ([place]) 01/06/2017   Echo 7/16 [facility] [place], [name]) Mild AI, mild LAE, mild concentric LVH, EF 55, normal wall motion, mild to moderate MR, mild PI, RVSP 55 // Echo 10/09/16 ([facility]):  Moderate LVH, grade 2 diastolic dysfunction,  mild MR, moderate LAE   . CKD (chronic kidney disease)    per pt, waiting on a kidney transplant in near future.  . Gout   . History of nuclear stress test    a. Nuc study 7/16: no scar or ischemia, EF 42 // b. Nuc study 12/17: EF 48, ?small apical ischemia, Low Risk  . HIV (human immunodeficiency virus infection) ([place])   . HLD (hyperlipidemia)   . Hypertension   . Hypertensive heart disease with CHF (congestive heart failure) ([place]) 10/09/2016  . OSA (obstructive sleep apnea)   . Post-operative nausea and vomiting    1 time as a child  . Sleep apnea    uses c-pap         Past Surgical History:  Procedure Laterality Date  . CHOLECYSTECTOMY    . GIVENS CAPSULE STUDY N/A 03/01/2018   Procedure: GIVENS CAPSULE STUDY;  Surgeon: Jerene Bears, MD;  Location: [place];  Service: Gastroenterology;  Laterality: N/A;  . HERNIA REPAIR     As baby  . NOSE SURGERY    . RENAL BIOPSY           Family History  Problem Relation Age of Onset  . Heart failure Mother   . Heart attack Maternal Grandmother 53  . Hypertension Sister   . Multiple sclerosis Sister   . Hypertension Brother   . Hypertension Sister   . Scoliosis Other   . Allergic  rhinitis Neg Hx   . Angioedema Neg Hx   . Asthma Neg Hx   . Eczema Neg Hx   . Immunodeficiency Neg Hx   . Urticaria Neg Hx     SOCIAL HISTORY: Social History        Socioeconomic History  . Marital status: Single    Spouse name: Not on file  . Number of children: Not on file  . Years of education: Not on file  . Highest education level: Not on file  Occupational History  . Not on file  Social Needs  . Financial resource strain: Not on file  . Food insecurity:    Worry: Not on file    Inability: Not on file  . Transportation needs:    Medical: Not on file    Non-medical: Not on file  Tobacco Use  . Smoking status: Former Smoker    Types: Cigarettes    Last attempt to quit:  2000    Years since quitting: 19.6  . Smokeless tobacco: Never Used  Substance and Sexual Activity  . Alcohol use: Yes    Alcohol/week: 2.0 standard drinks    Types: 2 Shots of liquor per week    Comment: Light use  . Drug use: No  . Sexual activity: Never    Partners: Male    Birth control/protection: Condom    Comment: declined condoms  Lifestyle  . Physical activity:    Days per week: Not on file    Minutes per session: Not on file  . Stress: Not on file  Relationships  . Social connections:    Talks on phone: Not on file    Gets together: Not on file    Attends religious service: Not on file    Active member of club or organization: Not on file    Attends meetings of clubs or organizations: Not on file    Relationship status: Not on file  . Intimate partner violence:    Fear of current or ex partner: Not on file    Emotionally abused: Not on file    Physically abused: Not on file    Forced sexual activity: Not on file  Other Topics Concern  . Not on file  Social History Narrative  . Not on file        Allergies  Allergen Reactions  . Lisinopril Cough  . Ace Inhibitors Cough          Current Outpatient Medications  Medication Sig Dispense Refill  . abacavir (ZIAGEN) 300 MG tablet Take 1 tablet (300 mg total) by mouth daily. 30 tablet 8  . albuterol (PROAIR HFA) 108 (90 Base) MCG/ACT inhaler Inhale 2 puffs into the lungs every 4 (four) hours as needed for wheezing or shortness of breath. 1 Inhaler 3  . allopurinol (ZYLOPRIM) 100 MG tablet Take 0.5 tablets (50 mg total) by mouth 2 (two) times daily. 60 tablet 0  . amLODipine (NORVASC) 10 MG tablet Take 1 tablet (10 mg total) by mouth daily. 30 tablet 1  . azelastine (ASTELIN) 0.1 % nasal spray Place 2 sprays into both nostrils 2 (two) times daily as needed for rhinitis. Use in each nostril as directed 30 mL 5  . carvedilol (COREG) 25 MG tablet TAKE 1 TABLET BY MOUTH TWICE  DAILY WITH FOOD (Patient taking differently: Take 25 mg by mouth 2 (two) times daily with a meal. ) 60 tablet 4  . cloNIDine (CATAPRES) 0.1 MG tablet TAKE 1 TABLET TWICE DAILY (Patient  taking differently: Take 0.1 mg by mouth 2 (two) times daily. ) 60 tablet 4  . gabapentin (NEURONTIN) 100 MG capsule Take 1 capsule (100 mg total) by mouth 3 (three) times daily as needed (pain). 90 capsule 0  . hydrALAZINE (APRESOLINE) 100 MG tablet TAKE 1 TABLET BY MOUTH THREE TIMES DAILY (Patient taking differently: Take 100 mg by mouth 3 (three) times daily. ) 90 tablet 8  . ipratropium (ATROVENT) 0.06 % nasal spray 2 sprays per nostril 2 to 3 times daily as needed (Patient taking differently: Place 2 sprays into both nostrils 3 (three) times daily as needed for rhinitis. ) 15 mL 5  . lamivudine (EPIVIR) 100 MG tablet Take 1 tablet (100 mg total) by mouth daily. 90 tablet 2  . levocetirizine (XYZAL) 5 MG tablet Take 1 tablet (5 mg total) by mouth every evening. 30 tablet 11  . levothyroxine (SYNTHROID, LEVOTHROID) 25 MCG tablet TAKE 1 TABLET(25 MCG) BY MOUTH DAILY BEFORE BREAKFAST 30 tablet 0  . Olopatadine HCl (PAZEO) 0.7 % SOLN Place 1 drop into both eyes daily. (Patient taking differently: Place 1 drop into both eyes daily as needed (dry eyes). ) 1 Bottle 5  . potassium chloride SA (K-DUR,KLOR-CON) 20 MEQ tablet Take 1 tablet (20 mEq total) by mouth 2 (two) times daily. 180 tablet 2  . raltegravir (ISENTRESS) 400 MG tablet Take 1 tablet (400 mg total) by mouth 2 (two) times daily. 60 tablet 8  . torsemide (DEMADEX) 100 MG tablet Take 1 tablet (100 mg total) by mouth 2 (two) times daily. 60 tablet 3  . triamcinolone cream (KENALOG) 0.1 % Apply 1 application topically 2 (two) times daily. (Patient taking differently: Apply 1 application topically 2 (two) times daily as needed (skin irritation). ) 30 g 0  . zinc gluconate 50 MG tablet Take 100 mg by mouth daily.     No current facility-administered medications  for this visit.     REVIEW OF SYSTEMS:  [X]  denotes positive finding, [ ]  denotes negative finding Cardiac  Comments:  Chest pain or chest pressure:    Shortness of breath upon exertion:    Short of breath when lying flat: x   Irregular heart rhythm:        Vascular    Pain in calf, thigh, or hip brought on by ambulation:    Pain in feet at night that wakes you up from your sleep:     Blood clot in your veins:    Leg swelling:         Pulmonary    Oxygen at home:    Productive cough:     Wheezing:         Neurologic    Sudden weakness in arms or legs:     Sudden numbness in arms or legs:  x   Sudden onset of difficulty speaking or slurred speech:    Temporary loss of vision in one eye:     Problems with dizziness:         Gastrointestinal    Blood in stool:     Vomited blood:         Genitourinary    Burning when urinating:     Blood in urine:        Psychiatric    Major depression:         Hematologic    Bleeding problems:    Problems with blood clotting too easily:    Anemia x   Skin  Rashes or ulcers:        Constitutional    Fever or chills:      PHYSICAL EXAM:     Vitals:   06/15/18 0842 06/15/18 0846  BP: (!) 154/94 (!) 157/96  Pulse: 76   Resp: 20   SpO2: 98%   Weight: 107 kg   Height: 5' 10"  (1.778 m)     GENERAL: The patient is a well-nourished male, in no acute distress. The vital signs are documented above. CARDIAC: There is a regular rate and rhythm.  VASCULAR:  2+ brachial pulse papable BUE. 2+ radial pulse palpable BUE. No upper extremity tissue loss. Motor and sensory intact BUE. PULMONARY: There is good air exchange bilaterally without wheezing or rales. ABDOMEN: Soft and non-tender with normal pitched bowel sounds. Well healed laparoscopic cholecystectomy incisions. MUSCULOSKELETAL: There are no major deformities or  cyanosis. NEUROLOGIC: No focal weakness or paresthesias are detected. SKIN: There are no ulcers or rashes noted. PSYCHIATRIC: The patient has a normal affect.  DATA:   I have independently reviewed his noninvasive imaging.  His upper extremity duplex study shows triphasic waveforms in bilateral brachial radial and ulnar arteries.  His vein mapping shows a nice cephalic vein in the right arm that is 3.3 mm at the wrist.  He also has a decent right basilic vein.  Unfortunately his left cephalic vein is small at the wrist and has another small segment in the mid upper arm.  His left basilic vein looks adequate.  Assessment/Plan:  52 year old male who is right-hand dominant that presents for AV fistula access evaluation in the setting of recent hospital admission for acute kidney injury superimposed on stage IV chronic kidney disease.  After review of his vein mapping patient would prefer a left arm fistula since he is right hand dominant.  Unfortunately his left cephalic vein is small at the wrist and small in the upper arm.  I think he would be best served with a left brachiobasilic fistula.  We discussed that this can be done either in one stage or often in a two-stage approach.  I offered him the opportunity to schedule his fistula creation today, but he would prefer to talk to his nephrologist next week prior to making any decisions.  Patient states he is strongly leaning toward peritoneal dialysis at this time and is hesitant to schedule surgery until he can talk to his nephrologist.  Offered him the opportunity to call and schedule his surgery at any time whenever he decides to move forward.   Marty Heck, MD [facility] [place] Office: [phone] Pager: [phone]

## 2018-07-03 ENCOUNTER — Encounter (HOSPITAL_COMMUNITY): Payer: Self-pay | Admitting: Vascular Surgery

## 2018-07-05 ENCOUNTER — Other Ambulatory Visit: Payer: Self-pay

## 2018-07-05 DIAGNOSIS — N185 Chronic kidney disease, stage 5: Secondary | ICD-10-CM

## 2018-07-06 ENCOUNTER — Ambulatory Visit (INDEPENDENT_AMBULATORY_CARE_PROVIDER_SITE_OTHER): Payer: Medicaid Other

## 2018-07-06 DIAGNOSIS — R002 Palpitations: Secondary | ICD-10-CM | POA: Diagnosis not present

## 2018-07-08 ENCOUNTER — Ambulatory Visit (INDEPENDENT_AMBULATORY_CARE_PROVIDER_SITE_OTHER): Payer: Medicaid Other | Admitting: Family Medicine

## 2018-07-08 ENCOUNTER — Encounter: Payer: Self-pay | Admitting: Family Medicine

## 2018-07-08 VITALS — BP 133/89 | HR 74 | Temp 98.7°F | Resp 16 | Ht 70.0 in | Wt 223.0 lb

## 2018-07-08 DIAGNOSIS — Z23 Encounter for immunization: Secondary | ICD-10-CM | POA: Diagnosis not present

## 2018-07-08 DIAGNOSIS — E876 Hypokalemia: Secondary | ICD-10-CM

## 2018-07-08 DIAGNOSIS — M6281 Muscle weakness (generalized): Secondary | ICD-10-CM

## 2018-07-08 DIAGNOSIS — B20 Human immunodeficiency virus [HIV] disease: Secondary | ICD-10-CM | POA: Diagnosis not present

## 2018-07-08 DIAGNOSIS — N184 Chronic kidney disease, stage 4 (severe): Secondary | ICD-10-CM | POA: Diagnosis not present

## 2018-07-08 DIAGNOSIS — I110 Hypertensive heart disease with heart failure: Secondary | ICD-10-CM

## 2018-07-08 DIAGNOSIS — E039 Hypothyroidism, unspecified: Secondary | ICD-10-CM

## 2018-07-08 DIAGNOSIS — I5032 Chronic diastolic (congestive) heart failure: Secondary | ICD-10-CM

## 2018-07-08 DIAGNOSIS — G629 Polyneuropathy, unspecified: Secondary | ICD-10-CM

## 2018-07-08 LAB — POCT URINALYSIS DIPSTICK
Bilirubin, UA: NEGATIVE
Blood, UA: NEGATIVE
Glucose, UA: NEGATIVE
Ketones, UA: NEGATIVE
Leukocytes, UA: NEGATIVE
Nitrite, UA: NEGATIVE
Protein, UA: POSITIVE — AB
Spec Grav, UA: 1.015 (ref 1.010–1.025)
Urobilinogen, UA: 0.2 E.U./dL
pH, UA: 6 (ref 5.0–8.0)

## 2018-07-08 NOTE — Progress Notes (Signed)
[facility]   Progress Note: General Provider: Lanae Boast, FNP  SUBJECTIVE:   Albert Eaton is a 52 y.o. male who  has a past medical history of Anemia, Chronic diastolic CHF (congestive heart failure) ([place]) (01/06/2017), CKD (chronic kidney disease), Gout, Hepatitis, History of nuclear stress test, HIV (human immunodeficiency virus infection) ([place]), HLD (hyperlipidemia), Hypertension, Hypertensive heart disease with CHF (congestive heart failure) ([place]) (10/09/2016), OSA (obstructive sleep apnea), Post-operative nausea and vomiting, Sleep apnea, Wears glasses, and Wears partial dentures.. Patient presents today for Hospitalization Follow-up (potassium low and dehydration ) and Muscle Pain (weakness/fatigue )  Patient seen in the ED 06/13/2018 and admitted for AKI and hypokalemia due to dehydration. AVF placed on 07/02/2018 by Dr. Monica Martinez.  Patient states that he is having muscle weakness with an unknown cause. Patient reports that the nephrologist removed the statin and the muscle weakness has not resolved.  CK-MB was elevated.   Review of Systems  Constitutional: Negative.   HENT: Negative.   Eyes: Negative.   Respiratory: Negative.   Cardiovascular: Negative.   Gastrointestinal: Negative.   Genitourinary: Negative.   Musculoskeletal: Negative.        Muscle weakness  Skin: Negative.   Neurological: Negative.   Psychiatric/Behavioral: Negative.      OBJECTIVE: BP 133/89 (BP Location: Right Arm, Patient Position: Sitting, Cuff Size: Normal)   Pulse 74   Temp 98.7 F (37.1 C) (Oral)   Resp 16   Ht 5' 10"  (1.778 m)   Wt 223 lb (101.2 kg)   SpO2 99%   BMI 32.00 kg/m   Physical Exam  Constitutional: He is oriented to person, place, and time. He appears well-developed and well-nourished. No distress.  HENT:  Head: Normocephalic and atraumatic.  Eyes: Pupils are equal, round, and reactive to light. Conjunctivae and EOM  are normal.  Neck: Normal range of motion.  Cardiovascular: Normal rate, regular rhythm, normal heart sounds and intact distal pulses.  Pulmonary/Chest: Effort normal and breath sounds normal. No respiratory distress.  Abdominal: Soft. Bowel sounds are normal. He exhibits no distension.  Musculoskeletal: Normal range of motion.  Neurological: He is alert and oriented to person, place, and time.  Skin: Skin is warm and dry.     Surgical scar noted. Well approximated without signs of infection.   Psychiatric: He has a normal mood and affect. His behavior is normal. Thought content normal.  Nursing note and vitals reviewed.   ASSESSMENT/PLAN:  1. Flu vaccine need - Flu Vaccine QUAD 6+ mos PF IM (Fluarix Quad PF)  2. Chronic diastolic CHF (congestive heart failure) ([place])  3. Hypertensive heart disease with congestive heart failure, unspecified heart failure type ([place])  4. Chronic kidney disease (CKD), stage IV (severe) (HCC) - Comprehensive metabolic panel - Urinalysis Dipstick  5. Hypokalemia - Comprehensive metabolic panel  6. Muscle weakness - Thyroid Panel With TSH - CKMB  7. Neuropathy - Thyroid Panel With TSH - Vitamin B12 - Vitamin D, 25-hydroxy  8. Hypothyroidism, unspecified type - Thyroid Panel With TSH   The current medical regimen is effective;  continue present plan and medications. No medication changes. Labs ordered with treatments pending.       The patient was given clear instructions to go to ER or return to medical center if symptoms do not improve, worsen or new problems develop. The patient verbalized understanding and agreed with plan of care.   Ms. Doug Sou. Nathaneil Canary, FNP-BC Patient [facility] [place], [place] [address] [phone]     This note has been created with Dragon speech recognition software and smart phrase technology. Any transcriptional errors are unintentional.

## 2018-07-08 NOTE — Patient Instructions (Signed)
It was a pleasure meeting you. Please check your mychart for communication from me. I will see you at your next visit.     Hypotonia Hypotonia is decreased muscle tone. Muscle tone is the amount of tension or resistance to movement in a muscle while at rest. Muscle tone is different from muscle strength, which is how much force a muscle can apply. Often, a person with hypotonia will also have weak muscles. Hypotonia is often a symptom of a serious underlying condition or a problem at birth. Hypotonia can be a short-term condition, such as when a baby is born prematurely, or a lifelong (chronic) condition. Hypotonic infants are referred to as "floppy" infants because of their abnormally limp bodies and lack of control over their movements. What are the causes? In some cases, the cause of this condition is not known. Possible causes include:  Injury or damage (trauma). Hypotonia is often caused by trauma that occurred before, during, or right after birth (perinataltrauma).  Genetic disorders.  Nerve disorders.  Muscle disorders.  Central nervous system (CNS) disorders.  Problems in the connections between nerves and muscles (neuromuscular junctions).  Problems in the tissues that connect bones to one another (ligaments).  Serious infections.  Premature birth.  What are the signs or symptoms? Symptoms of this condition vary based on the cause and the patient's age. The main symptoms include:  Low muscle tone in the entire body or in specific areas of the body.  Joints that are unusually flexible. This is often seen in the hips, elbows, and knees.  Poor reflexes.  Muscle weakness.  Symptoms in Infants  Limp or "floppy" movements.  Little to no control of the neck muscles.  Inability to place weight on the limbs.  Limbs than hang straight down instead of bending at the joints.  Delayed development, especially with actions that involve the muscles (motor skills).  Poor  sucking ability and poor weight gain.  Decreased alertness. Symptoms in Older Children and Adults  Weight loss.  Difficulty getting up and reaching for objects.  Double vision.  Decreased energy.  Poor posture and balance. Depending on the cause, symptoms may improve, stay the same, or get worse over time. How is this diagnosed? This condition is often diagnosed during infancy, but sometimes it can develop in older children and adults. Diagnosis is based on a physical exam and medical history. You may have tests, including:  MRI.  CT scan.  Electromyogram with nerve conduction studies (EMG with NCS). This evaluates the function of the nerves, neuromuscular junctions, and muscles.  Electroencephalogram (EEG). This records brain activity.  Removal of a small number of muscle or nerve cells that are examined under a microscope (biopsy).  Genetic testing.  You may be given the name of a health care provider who specializes in CNS disorders (neurologist). How is this treated? Treatment for hypotonia depends on the cause. Treatment options include:  Treating the underlying condition that causes your hypotonia, if this applies.  Physical therapy to improve motor skills and functional strength.  Occupational therapy to improve skills needed for everyday activities, such as fine motor skills (dexterity).  Speech-language therapy to overcome difficulty swallowing and talking.  Follow these instructions at home:  Take over-the-counter and prescription medicines only as told by your health care provider.  Pay attention to any changes in your symptoms.  Use equipment, such as braces or wheelchairs, as told by your health care provider.  If you feel weak or unstable, sit or lie  down right away.  Ask your health care provider what activities are safe for you. You may be told to avoid certain physical activities.  Ask for help when you need it, such as when lifting heavy objects  or reaching for things.  Keep all follow-up visits as told by your health care provider. This is important. Contact a health care provider if:  You have side effects from any medicines you are taking.  Your symptoms suddenly change or get worse.  You have unusual stiffness.  You have a sudden change in mood or behavior.  You feel hopeless or depressed. Get help right away if:  You have difficulty breathing.  You have difficulty seeing or your vision changes.  You have numbness in part of your body. This information is not intended to replace advice given to you by your health care provider. Make sure you discuss any questions you have with your health care provider. Document Released: 10/03/2002 Document Revised: 03/20/2016 Document Reviewed: 02/28/2015 Elsevier Interactive Patient Education  2018 [employer].

## 2018-07-09 ENCOUNTER — Other Ambulatory Visit: Payer: Self-pay | Admitting: Family Medicine

## 2018-07-09 DIAGNOSIS — E039 Hypothyroidism, unspecified: Secondary | ICD-10-CM

## 2018-07-09 DIAGNOSIS — R600 Localized edema: Secondary | ICD-10-CM

## 2018-07-09 DIAGNOSIS — M109 Gout, unspecified: Secondary | ICD-10-CM

## 2018-07-09 LAB — COMPREHENSIVE METABOLIC PANEL
ALT: 15 IU/L (ref 0–44)
AST: 12 IU/L (ref 0–40)
Albumin/Globulin Ratio: 1.7 (ref 1.2–2.2)
Albumin: 4.6 g/dL (ref 3.5–5.5)
Alkaline Phosphatase: 80 IU/L (ref 39–117)
BUN/Creatinine Ratio: 14 (ref 9–20)
BUN: 49 mg/dL — ABNORMAL HIGH (ref 6–24)
Bilirubin Total: <0.2 mg/dL (ref 0.0–1.2)
CO2: 27 mmol/L (ref 20–29)
Calcium: 9.9 mg/dL (ref 8.7–10.2)
Chloride: 99 mmol/L (ref 96–106)
Creatinine, Ser: 3.57 mg/dL — ABNORMAL HIGH (ref 0.76–1.27)
GFR calc Af Amer: 21 mL/min/{1.73_m2} — ABNORMAL LOW (ref >59)
GFR calc non Af Amer: 18 mL/min/{1.73_m2} — ABNORMAL LOW (ref >59)
Globulin, Total: 2.7 g/dL (ref 1.5–4.5)
Glucose: 99 mg/dL (ref 65–99)
Potassium: 4 mmol/L (ref 3.5–5.2)
Sodium: 142 mmol/L (ref 134–144)
Total Protein: 7.3 g/dL (ref 6.0–8.5)

## 2018-07-09 LAB — THYROID PANEL WITH TSH
Free Thyroxine Index: 1.9 (ref 1.2–4.9)
T3 Uptake Ratio: 28 % (ref 24–39)
T4, Total: 6.8 ug/dL (ref 4.5–12.0)
TSH: 2.46 u[IU]/mL (ref 0.450–4.500)

## 2018-07-09 LAB — CREATININE KINASE MB: CK-MB Index: 1.9 ng/mL (ref 0.0–10.4)

## 2018-07-09 LAB — VITAMIN B12: Vitamin B-12: 698 pg/mL (ref 232–1245)

## 2018-07-09 LAB — VITAMIN D 25 HYDROXY (VIT D DEFICIENCY, FRACTURES): Vit D, 25-Hydroxy: 14.9 ng/mL — ABNORMAL LOW (ref 30.0–100.0)

## 2018-07-12 ENCOUNTER — Other Ambulatory Visit: Payer: Self-pay | Admitting: Family Medicine

## 2018-07-12 MED ORDER — VITAMIN D (ERGOCALCIFEROL) 1.25 MG (50000 UNIT) PO CAPS: 50000.0000 [IU] | ORAL_CAPSULE | ORAL | 0 refills | Status: DC

## 2018-07-21 ENCOUNTER — Ambulatory Visit (INDEPENDENT_AMBULATORY_CARE_PROVIDER_SITE_OTHER): Payer: Medicaid Other | Admitting: Family Medicine

## 2018-07-21 ENCOUNTER — Encounter: Payer: Self-pay | Admitting: Family Medicine

## 2018-07-21 ENCOUNTER — Other Ambulatory Visit: Payer: Self-pay

## 2018-07-21 VITALS — BP 137/90 | HR 67 | Temp 98.4°F | Resp 18 | Ht 70.0 in | Wt 219.2 lb

## 2018-07-21 DIAGNOSIS — M6281 Muscle weakness (generalized): Secondary | ICD-10-CM | POA: Diagnosis not present

## 2018-07-21 NOTE — Progress Notes (Signed)
  Patient [facility]   Progress Note: General Provider: Lanae Boast, FNP  SUBJECTIVE:   Albert Eaton is a 52 y.o. male who  has a past medical history of Anemia, Chronic diastolic CHF (congestive heart failure) ([place]) (01/06/2017), CKD (chronic kidney disease), Gout, Hepatitis, History of nuclear stress test, HIV (human immunodeficiency virus infection) ([place]), HLD (hyperlipidemia), Hypertension, Hypertensive heart disease with CHF (congestive heart failure) ([place]) (10/09/2016), OSA (obstructive sleep apnea), Post-operative nausea and vomiting, Sleep apnea, Wears glasses, and Wears partial dentures.. Patient presents today for Follow-up (low vitamin d - weakness )  Patient states that the weakness is described as a "muscle ache" or fatigue of his muscles. States that it can affect various parts of the body. He only has relief with rest and stopping the movement of the muscle that is in pain.  Patient states that he is on a 2 L fluid restriction that he does not follow.  Review of Systems  Constitutional: Negative.   HENT: Negative.   Eyes: Negative.   Respiratory: Negative.   Cardiovascular: Negative.   Gastrointestinal: Negative.   Genitourinary: Negative.   Musculoskeletal: Positive for myalgias.  Skin: Negative.   Neurological: Negative.   Psychiatric/Behavioral: Negative.    OBJECTIVE: BP 137/90 (BP Location: Left Arm, Patient Position: Sitting, Cuff Size: Normal)   Pulse 67   Temp 98.4 F (36.9 C) (Oral)   Resp 18   Ht 5' 10"  (1.778 m)   Wt 219 lb 3.2 oz (99.4 kg)   SpO2 98%   BMI 31.45 kg/m   Physical Exam  Constitutional: He is oriented to person, place, and time. He appears well-developed and well-nourished. No distress.  HENT:  Head: Normocephalic and atraumatic.  Eyes: Pupils are equal, round, and reactive to light. Conjunctivae and EOM are normal.  Neck: Normal range of motion.  Cardiovascular: Normal rate, regular rhythm,  normal heart sounds and intact distal pulses.  Pulmonary/Chest: Effort normal and breath sounds normal. No respiratory distress.  Abdominal: Soft. Bowel sounds are normal. He exhibits no distension.  Musculoskeletal: Normal range of motion.  Neurological: He is alert and oriented to person, place, and time.  Skin: Skin is warm and dry.  Psychiatric: He has a normal mood and affect. His behavior is normal. Thought content normal.  Nursing note and vitals reviewed.   ASSESSMENT/PLAN:  1. Muscle weakness (generalized) We will repeat creatinine kinase level today.  It was elevated at [facility] at the patient's last visit.  We discussed cachexia and how his multiple illnesses can cause muscle fatigue. We also discussed him getting in with a sooner appointment with his ID doctor and nephrologist.  Advised patient to adhere to the fluid restriction. - CK       The patient was given clear instructions to go to ER or return to medical center if symptoms do not improve, worsen or new problems develop. The patient verbalized understanding and agreed with plan of care.   Ms. Doug Sou. Nathaneil Canary, FNP-BC Patient [facility] [address] [place], [place] [address] [phone]     This note has been created with Dragon speech recognition software and smart phrase technology. Any transcriptional errors are unintentional.

## 2018-07-21 NOTE — Patient Instructions (Signed)
Creatine Kinase Test Why am I having this test? The creatine kinase (CK) test is performed to determine if there has been damage to muscle tissue in your body. This test can be used to help diagnose heart attack, neurologic diseases, or skeletal diseases. Three different forms of CK are present in your body. They are referred to as isoenzymes:  CK-MM is found in your skeletal muscles and heart.  CK-MB is found mostly in your heart.  CK-BB is found mostly in your brain.  What kind of sample is taken? A blood sample is required for this test. It is usually collected by inserting a needle into a vein. How do I prepare for this test? There is no preparation required for this test. Be aware that your health care provider may require blood samples to be taken at regular intervals for up to 1 week. What are the reference ranges? Reference ranges are considered healthy ranges established after testing a large group of healthy people. Reference ranges may vary among different people, labs, and hospitals. It is your responsibility to obtain your test results. Ask the lab or department performing the test when and how you will get your results. The reference ranges for the CK test are as follows: Total CK:  Adult or elderly (values are higher after exercise): ? Male: 55-170 units/L or 55-170 units/L (SI units). ? Male: 30-135 units/L or 30-135 units/L (SI units).  Newborn: 68-580 units/L (SI units). Isoenzymes:  CK-MM: 100%.  CK-MB: 0%.  CK-BB: 0%. What do the results mean? Levels of total CK that are above the reference ranges may indicate injury or diseases affecting the heart, skeletal muscle, or brain. Increased levels of CK-MM isoenzyme may indicate:  Certain diseases affecting the skeletal muscle.  Recent surgery, trauma, or injury.  Conditions that cause convulsions.  Increased levels of CK-MB isoenzyme may indicate:  Recent heart attack.  Other conditions that cause  injury to the heart muscle.  Increased levels of CK-BB isoenzyme may indicate:  Diseases that affect the central nervous system.  Certain psychiatric therapies.  Certain types of cancer.  Injury to the lungs.  Talk with your health care provider to discuss your results, treatment options, and if necessary, the need for more tests. Talk with your health care provider if you have any questions about your results. Talk with your health care provider to discuss your results, treatment options, and if necessary, the need for more tests. Talk with your health care provider if you have any questions about your results. This information is not intended to replace advice given to you by your health care provider. Make sure you discuss any questions you have with your health care provider. Document Released: 11/13/2004 Document Revised: 06/17/2016 Document Reviewed: 03/09/2014 Elsevier Interactive Patient Education  [employer].

## 2018-07-22 LAB — CK: Total CK: 196 U/L (ref 24–204)

## 2018-08-08 ENCOUNTER — Other Ambulatory Visit: Payer: Self-pay | Admitting: Cardiovascular Disease

## 2018-08-08 ENCOUNTER — Other Ambulatory Visit: Payer: Self-pay | Admitting: Family Medicine

## 2018-08-08 DIAGNOSIS — R600 Localized edema: Secondary | ICD-10-CM

## 2018-08-08 DIAGNOSIS — M109 Gout, unspecified: Secondary | ICD-10-CM

## 2018-08-08 DIAGNOSIS — E039 Hypothyroidism, unspecified: Secondary | ICD-10-CM

## 2018-08-08 DIAGNOSIS — I5032 Chronic diastolic (congestive) heart failure: Secondary | ICD-10-CM

## 2018-08-10 ENCOUNTER — Encounter: Payer: Self-pay | Admitting: *Deleted

## 2018-08-10 ENCOUNTER — Ambulatory Visit (INDEPENDENT_AMBULATORY_CARE_PROVIDER_SITE_OTHER): Payer: Medicaid Other | Admitting: Vascular Surgery

## 2018-08-10 ENCOUNTER — Other Ambulatory Visit: Payer: Self-pay

## 2018-08-10 ENCOUNTER — Ambulatory Visit (HOSPITAL_COMMUNITY)
Admission: RE | Admit: 2018-08-10 | Discharge: 2018-08-10 | Disposition: A | Discharge status: Home / Self Care | Payer: Medicaid Other | Source: Ambulatory Visit | Attending: Vascular Surgery | Admitting: Vascular Surgery

## 2018-08-10 ENCOUNTER — Encounter: Payer: Self-pay | Admitting: Vascular Surgery

## 2018-08-10 VITALS — BP 140/83 | HR 72 | Temp 98.4°F | Resp 18 | Ht 70.0 in | Wt 218.0 lb

## 2018-08-10 DIAGNOSIS — N185 Chronic kidney disease, stage 5: Secondary | ICD-10-CM

## 2018-08-10 DIAGNOSIS — N184 Chronic kidney disease, stage 4 (severe): Secondary | ICD-10-CM

## 2018-08-10 NOTE — Progress Notes (Signed)
Patient name: Albert Eaton MRN: [id number] DOB: [birth year] Sex: male  REASON FOR VISIT: Post-op  HPI: Albert Eaton is a 52 y.o. male with stage IV chronic kidney disease that presents for one-month follow-up after left brachiobasilic first stage fistula placement.  Overall he is done well in the interim he has no weakness or numbness or tingling in his left hand.  He does describe some mild hyper hyperesthesia of his left forearm.  He had his fistula duplex today and states he can still feel a thrill in the fistula.  Past Medical History:  Diagnosis Date  . Anemia   . Chronic diastolic CHF (congestive heart failure) ([place]) 01/06/2017   Echo 7/16 [facility] in [place], [name]) Mild AI, mild LAE, mild concentric LVH, EF 55, normal wall motion, mild to moderate MR, mild PI, RVSP 55 // Echo 10/09/16 ([facility]):  Moderate LVH, grade 2 diastolic dysfunction, mild MR, moderate LAE   . CKD (chronic kidney disease)    per pt, waiting on a kidney transplant in near future.  . Gout   . Hepatitis    Hep B  . History of nuclear stress test    a. Nuc study 7/16: no scar or ischemia, EF 42 // b. Nuc study 12/17: EF 48, ?small apical ischemia, Low Risk  . HIV (human immunodeficiency virus infection) ([place])   . HLD (hyperlipidemia)   . Hypertension   . Hypertensive heart disease with CHF (congestive heart failure) ([place]) 10/09/2016  . OSA (obstructive sleep apnea)   . Post-operative nausea and vomiting    1 time as a child  . Sleep apnea    uses c-pap  . Wears glasses   . Wears partial dentures     Past Surgical History:  Procedure Laterality Date  . AV FISTULA PLACEMENT Left 07/02/2018   Procedure: Creation of Left arm BRACHIOBASILIC ARTERIOVENOUS  FISTULA;  Surgeon: Marty Heck, MD;  Location: [facility];  Service: Vascular;  Laterality: Left;  . CHOLECYSTECTOMY    . COLONOSCOPY W/ BIOPSIES AND POLYPECTOMY    . GIVENS CAPSULE STUDY N/A 03/01/2018   Procedure: GIVENS CAPSULE STUDY;  Surgeon:  Jerene Bears, MD;  Location: [facility];  Service: Gastroenterology;  Laterality: N/A;  . HERNIA REPAIR     As baby  . MULTIPLE TOOTH EXTRACTIONS    . NOSE SURGERY    . RENAL BIOPSY      Family History  Problem Relation Age of Onset  . Heart failure Mother   . Heart attack Maternal Grandmother 53  . Hypertension Sister   . Multiple sclerosis Sister   . Hypertension Brother   . Hypertension Sister   . Scoliosis Other   . Allergic rhinitis Neg Hx   . Angioedema Neg Hx   . Asthma Neg Hx   . Eczema Neg Hx   . Immunodeficiency Neg Hx   . Urticaria Neg Hx     SOCIAL HISTORY: Social History   Tobacco Use  . Smoking status: Former Smoker    Types: Cigarettes    Last attempt to quit: 2000    Years since quitting: 19.8  . Smokeless tobacco: Never Used  Substance Use Topics  . Alcohol use: Not Currently    Alcohol/week: 2.0 standard drinks    Types: 2 Shots of liquor per week    Allergies  Allergen Reactions  . Lisinopril Cough  . Ace Inhibitors Cough    Current Outpatient Medications  Medication Sig Dispense Refill  . abacavir (  ZIAGEN) 300 MG tablet Take 1 tablet (300 mg total) by mouth daily. 30 tablet 8  . albuterol (PROAIR HFA) 108 (90 Base) MCG/ACT inhaler Inhale 2 puffs into the lungs every 4 (four) hours as needed for wheezing or shortness of breath. 1 Inhaler 3  . allopurinol (ZYLOPRIM) 100 MG tablet Take 0.5 tablets (50 mg total) by mouth 2 (two) times daily. 60 tablet 0  . allopurinol (ZYLOPRIM) 100 MG tablet TAKE 1 TABLET(100 MG) BY MOUTH TWICE DAILY 60 tablet 0  . amLODipine (NORVASC) 10 MG tablet Take 1 tablet (10 mg total) by mouth daily. 30 tablet 1  . azelastine (ASTELIN) 0.1 % nasal spray Place 2 sprays into both nostrils 2 (two) times daily as needed for rhinitis. Use in each nostril as directed 30 mL 5  . carvedilol (COREG) 25 MG tablet TAKE 1 TABLET BY MOUTH TWICE DAILY WITH FOOD 60 tablet 11  . cloNIDine (CATAPRES) 0.1 MG tablet Take 1 tablet (0.1  mg total) by mouth 2 (two) times daily. 180 tablet 2  . famotidine (PEPCID) 20 MG tablet   9  . gabapentin (NEURONTIN) 100 MG capsule Take 1 capsule (100 mg total) by mouth 3 (three) times daily as needed (pain). 90 capsule 0  . hydrALAZINE (APRESOLINE) 100 MG tablet TAKE 1 TABLET BY MOUTH THREE TIMES DAILY (Patient taking differently: Take 100 mg by mouth 3 (three) times daily. ) 90 tablet 8  . ipratropium (ATROVENT) 0.06 % nasal spray 2 sprays per nostril 2 to 3 times daily as needed (Patient taking differently: Place 2 sprays into both nostrils 3 (three) times daily as needed for rhinitis. ) 15 mL 5  . lamivudine (EPIVIR) 100 MG tablet Take 1 tablet (100 mg total) by mouth daily. 90 tablet 2  . levocetirizine (XYZAL) 5 MG tablet Take 1 tablet (5 mg total) by mouth every evening. 30 tablet 11  . levothyroxine (SYNTHROID, LEVOTHROID) 25 MCG tablet TAKE 1 TABLET(25 MCG) BY MOUTH DAILY BEFORE BREAKFAST 30 tablet 0  . metolazone (ZAROXOLYN) 2.5 MG tablet TK 1 T PO ONCE A WEEK PRF FLUID BUILD-UP UTD  1  . Olopatadine HCl (PAZEO) 0.7 % SOLN Place 1 drop into both eyes daily. (Patient taking differently: Place 1 drop into both eyes daily as needed (dry eyes). ) 1 Bottle 5  . potassium chloride SA (K-DUR,KLOR-CON) 20 MEQ tablet Take 1 tablet (20 mEq total) by mouth 2 (two) times daily. 180 tablet 2  . raltegravir (ISENTRESS) 400 MG tablet Take 1 tablet (400 mg total) by mouth 2 (two) times daily. 60 tablet 8  . spironolactone (ALDACTONE) 25 MG tablet TAKE 1 TABLET(25 MG) BY MOUTH DAILY 30 tablet 0  . torsemide (DEMADEX) 100 MG tablet Take 1 tablet (100 mg total) by mouth 2 (two) times daily. 60 tablet 3  . triamcinolone cream (KENALOG) 0.1 % Apply 1 application topically 2 (two) times daily. (Patient taking differently: Apply 1 application topically 2 (two) times daily as needed (skin irritation). ) 30 g 0  . Turmeric 400 MG CAPS Take by mouth.    . Vitamin D, Ergocalciferol, (DRISDOL) 50000 units CAPS  capsule Take 1 capsule (50,000 Units total) by mouth every 7 (seven) days. 8 capsule 0  . zinc gluconate 50 MG tablet Take 100 mg by mouth daily.    [place] Kitchen oxyCODONE-acetaminophen (PERCOCET) 5-325 MG tablet Take 1 tablet by mouth every 6 (six) hours as needed for severe pain. (Patient not taking: Reported on 08/10/2018) 8 tablet 0  No current facility-administered medications for this visit.     REVIEW OF SYSTEMS:  [X]  denotes positive finding, [ ]  denotes negative finding Cardiac  Comments:  Chest pain or chest pressure:    Shortness of breath upon exertion:    Short of breath when lying flat:    Irregular heart rhythm:        Vascular    Pain in calf, thigh, or hip brought on by ambulation:    Pain in feet at night that wakes you up from your sleep:     Blood clot in your veins:    Leg swelling:         Pulmonary    Oxygen at home:    Productive cough:     Wheezing:         Neurologic    Sudden weakness in arms or legs:     Sudden numbness in arms or legs:     Sudden onset of difficulty speaking or slurred speech:    Temporary loss of vision in one eye:     Problems with dizziness:         Gastrointestinal    Blood in stool:     Vomited blood:         Genitourinary    Burning when urinating:     Blood in urine:        Psychiatric    Major depression:         Hematologic    Bleeding problems:    Problems with blood clotting too easily:        Skin    Rashes or ulcers:        Constitutional    Fever or chills:      PHYSICAL EXAM: Vitals:   08/10/18 0905  BP: 140/83  Pulse: 72  Resp: 18  Temp: 98.4 F (36.9 C)  TempSrc: Oral  SpO2: 98%  Weight: 98.9 kg  Height: 5' 10"  (1.778 m)    GENERAL: The patient is a well-nourished male, in no acute distress. The vital signs are documented above. VASCULAR:  2+ palpable left radial pulse Good thrill in left brachiobasilic fistula Incision well healed PULMONARY: No respiratory distress   DATA:   I  independently reviewed his fistula duplex that shows a nice flow volume of 2391 in the fistula with a nicely dilated basilic vein in the upper arm over 6 mm.  Assessment/Plan:  52 year old male with stage IV chronic kidney disease status post first stage left upper extremity brachial basilic fistula.  This appears to be maturing nicely and has good flow volumes with a nicely dilated basilic vein in the upper arm.  He has a good thrill in the fistula on exam today.  We will plan for scheduling his second stage transposition.  We discussed ongoing risks and benefits of surgery including bleeding, infection, steal.   Marty Heck, MD [facility] Office: [phone] Pager: [place]

## 2018-08-11 ENCOUNTER — Other Ambulatory Visit: Payer: Self-pay | Admitting: *Deleted

## 2018-08-26 ENCOUNTER — Other Ambulatory Visit: Payer: Self-pay

## 2018-08-26 ENCOUNTER — Encounter (HOSPITAL_COMMUNITY): Payer: Self-pay | Admitting: *Deleted

## 2018-08-26 NOTE — Progress Notes (Signed)
Pt denies SOB and chest pain. Pt under the care of Dr. [name], Cardiology. Pt denies having a cardiac cath. Pt made aware to stop taking vitamins, fish oil, Turmeric and herbal medications. Do not take any NSAIDs ie: Ibuprofen, Advil, Naproxen (Aleve), Motrin, BC and Goody Powder. Pt verbalized understanding of all pre-op instructions. See previous anesthesia note ( 07/01/18).

## 2018-08-27 ENCOUNTER — Encounter (HOSPITAL_COMMUNITY)
Admission: RE | Disposition: A | Discharge status: Home / Self Care | Payer: Self-pay | Source: Ambulatory Visit | Attending: Vascular Surgery

## 2018-08-27 ENCOUNTER — Ambulatory Visit (HOSPITAL_COMMUNITY): Payer: Medicaid Other | Admitting: Anesthesiology

## 2018-08-27 ENCOUNTER — Encounter (HOSPITAL_COMMUNITY): Payer: Self-pay | Admitting: *Deleted

## 2018-08-27 ENCOUNTER — Telehealth: Payer: Self-pay | Admitting: Vascular Surgery

## 2018-08-27 ENCOUNTER — Ambulatory Visit (HOSPITAL_COMMUNITY)
Admission: RE | Admit: 2018-08-27 | Discharge: 2018-08-27 | Disposition: A | Discharge status: Home / Self Care | Payer: Medicaid Other | Source: Ambulatory Visit | Attending: Vascular Surgery | Admitting: Vascular Surgery

## 2018-08-27 DIAGNOSIS — E669 Obesity, unspecified: Secondary | ICD-10-CM | POA: Diagnosis not present

## 2018-08-27 DIAGNOSIS — E039 Hypothyroidism, unspecified: Secondary | ICD-10-CM | POA: Insufficient documentation

## 2018-08-27 DIAGNOSIS — I130 Hypertensive heart and chronic kidney disease with heart failure and stage 1 through stage 4 chronic kidney disease, or unspecified chronic kidney disease: Secondary | ICD-10-CM | POA: Diagnosis not present

## 2018-08-27 DIAGNOSIS — B20 Human immunodeficiency virus [HIV] disease: Secondary | ICD-10-CM | POA: Insufficient documentation

## 2018-08-27 DIAGNOSIS — Z6831 Body mass index (BMI) 31.0-31.9, adult: Secondary | ICD-10-CM | POA: Insufficient documentation

## 2018-08-27 DIAGNOSIS — E785 Hyperlipidemia, unspecified: Secondary | ICD-10-CM | POA: Insufficient documentation

## 2018-08-27 DIAGNOSIS — N184 Chronic kidney disease, stage 4 (severe): Secondary | ICD-10-CM | POA: Insufficient documentation

## 2018-08-27 DIAGNOSIS — G4733 Obstructive sleep apnea (adult) (pediatric): Secondary | ICD-10-CM | POA: Insufficient documentation

## 2018-08-27 DIAGNOSIS — M109 Gout, unspecified: Secondary | ICD-10-CM | POA: Diagnosis not present

## 2018-08-27 DIAGNOSIS — Z8249 Family history of ischemic heart disease and other diseases of the circulatory system: Secondary | ICD-10-CM | POA: Diagnosis not present

## 2018-08-27 DIAGNOSIS — I5032 Chronic diastolic (congestive) heart failure: Secondary | ICD-10-CM | POA: Diagnosis not present

## 2018-08-27 DIAGNOSIS — Z87891 Personal history of nicotine dependence: Secondary | ICD-10-CM | POA: Insufficient documentation

## 2018-08-27 DIAGNOSIS — Z7682 Awaiting organ transplant status: Secondary | ICD-10-CM | POA: Diagnosis not present

## 2018-08-27 HISTORY — PX: BASCILIC VEIN TRANSPOSITION: SHX5742

## 2018-08-27 LAB — POCT I-STAT 4, (NA,K, GLUC, HGB,HCT)
Glucose, Bld: 107 mg/dL — ABNORMAL HIGH (ref 70–99)
HCT: 32 % — ABNORMAL LOW (ref 39.0–52.0)
HEMOGLOBIN: 10.9 g/dL — AB (ref 13.0–17.0)
Potassium: 2.8 mmol/L — ABNORMAL LOW (ref 3.5–5.1)
Sodium: 142 mmol/L (ref 135–145)

## 2018-08-27 SURGERY — TRANSPOSITION, VEIN, BASILIC
Anesthesia: General | Site: Arm Upper | Laterality: Left

## 2018-08-27 MED ORDER — HYDROMORPHONE HCL 1 MG/ML IJ SOLN
INTRAMUSCULAR | Status: AC
  Filled 2018-08-27: qty 1

## 2018-08-27 MED ORDER — MIDAZOLAM HCL 2 MG/2ML IJ SOLN
INTRAMUSCULAR | Status: DC | PRN
  Administered 2018-08-27: 2 mg via INTRAVENOUS

## 2018-08-27 MED ORDER — OXYCODONE-ACETAMINOPHEN 5-325 MG PO TABS: 1.0000 | ORAL_TABLET | Freq: Four times a day (QID) | ORAL | 0 refills | Status: DC | PRN

## 2018-08-27 MED ORDER — OXYCODONE HCL 5 MG PO TABS
5.0000 mg | ORAL_TABLET | Freq: Once | ORAL | Status: AC | PRN
  Administered 2018-08-27: 5 mg via ORAL

## 2018-08-27 MED ORDER — 0.9 % SODIUM CHLORIDE (POUR BTL) OPTIME
TOPICAL | Status: DC | PRN
  Administered 2018-08-27: 1000 mL

## 2018-08-27 MED ORDER — PROMETHAZINE HCL 25 MG/ML IJ SOLN: 6.2500 mg | INTRAMUSCULAR | Status: DC | PRN

## 2018-08-27 MED ORDER — LIDOCAINE 2% (20 MG/ML) 5 ML SYRINGE
INTRAMUSCULAR | Status: AC
  Filled 2018-08-27: qty 5

## 2018-08-27 MED ORDER — EPHEDRINE 5 MG/ML INJ
INTRAVENOUS | Status: AC
  Filled 2018-08-27: qty 10

## 2018-08-27 MED ORDER — LIDOCAINE-EPINEPHRINE (PF) 1 %-1:200000 IJ SOLN
INTRAMUSCULAR | Status: AC
  Filled 2018-08-27: qty 30

## 2018-08-27 MED ORDER — LIDOCAINE HCL (PF) 1 % IJ SOLN
INTRAMUSCULAR | Status: AC
  Filled 2018-08-27: qty 30

## 2018-08-27 MED ORDER — FENTANYL CITRATE (PF) 250 MCG/5ML IJ SOLN
INTRAMUSCULAR | Status: AC
  Filled 2018-08-27: qty 5

## 2018-08-27 MED ORDER — OXYCODONE HCL 5 MG PO TABS
ORAL_TABLET | ORAL | Status: AC
  Filled 2018-08-27: qty 1

## 2018-08-27 MED ORDER — MIDAZOLAM HCL 2 MG/2ML IJ SOLN
INTRAMUSCULAR | Status: AC
  Filled 2018-08-27: qty 2

## 2018-08-27 MED ORDER — SODIUM CHLORIDE 0.9 % IV SOLN
INTRAVENOUS | Status: DC
  Administered 2018-08-27: 10:00:00 via INTRAVENOUS

## 2018-08-27 MED ORDER — EPHEDRINE SULFATE-NACL 50-0.9 MG/10ML-% IV SOSY
PREFILLED_SYRINGE | INTRAVENOUS | Status: DC | PRN
  Administered 2018-08-27 (×5): 10 mg via INTRAVENOUS

## 2018-08-27 MED ORDER — HYDROMORPHONE HCL 1 MG/ML IJ SOLN
0.2500 mg | INTRAMUSCULAR | Status: DC | PRN
  Administered 2018-08-27 (×2): 0.5 mg via INTRAVENOUS

## 2018-08-27 MED ORDER — HEPARIN SODIUM (PORCINE) 1000 UNIT/ML IJ SOLN
INTRAMUSCULAR | Status: DC | PRN
  Administered 2018-08-27: 3000 [IU] via INTRAVENOUS

## 2018-08-27 MED ORDER — PROPOFOL 10 MG/ML IV BOLUS
INTRAVENOUS | Status: DC | PRN
  Administered 2018-08-27: 200 mg via INTRAVENOUS

## 2018-08-27 MED ORDER — DEXAMETHASONE SODIUM PHOSPHATE 10 MG/ML IJ SOLN
INTRAMUSCULAR | Status: AC
  Filled 2018-08-27: qty 1

## 2018-08-27 MED ORDER — LIDOCAINE 2% (20 MG/ML) 5 ML SYRINGE
INTRAMUSCULAR | Status: DC | PRN
  Administered 2018-08-27: 80 mg via INTRAVENOUS

## 2018-08-27 MED ORDER — SODIUM CHLORIDE 0.9 % IV SOLN
INTRAVENOUS | Status: AC
  Filled 2018-08-27: qty 1.2

## 2018-08-27 MED ORDER — DEXAMETHASONE SODIUM PHOSPHATE 10 MG/ML IJ SOLN
INTRAMUSCULAR | Status: DC | PRN
  Administered 2018-08-27: 10 mg via INTRAVENOUS

## 2018-08-27 MED ORDER — FENTANYL CITRATE (PF) 250 MCG/5ML IJ SOLN
INTRAMUSCULAR | Status: DC | PRN
  Administered 2018-08-27 (×2): 50 ug via INTRAVENOUS
  Administered 2018-08-27: 25 ug via INTRAVENOUS

## 2018-08-27 MED ORDER — CEFAZOLIN SODIUM-DEXTROSE 2-4 GM/100ML-% IV SOLN
2.0000 g | INTRAVENOUS | Status: AC
  Administered 2018-08-27: 2 g via INTRAVENOUS
  Filled 2018-08-27: qty 100

## 2018-08-27 MED ORDER — ONDANSETRON HCL 4 MG/2ML IJ SOLN
INTRAMUSCULAR | Status: DC | PRN
  Administered 2018-08-27: 4 mg via INTRAVENOUS

## 2018-08-27 MED ORDER — SODIUM CHLORIDE 0.9 % IV SOLN
INTRAVENOUS | Status: DC | PRN
  Administered 2018-08-27: 500 mL

## 2018-08-27 MED ORDER — CEFAZOLIN SODIUM-DEXTROSE 2-4 GM/100ML-% IV SOLN
INTRAVENOUS | Status: AC
  Filled 2018-08-27: qty 100

## 2018-08-27 MED ORDER — OXYCODONE HCL 5 MG/5ML PO SOLN: 5.0000 mg | Freq: Once | ORAL | Status: AC | PRN

## 2018-08-27 SURGICAL SUPPLY — 36 items
ARMBAND PINK RESTRICT EXTREMIT (MISCELLANEOUS) ×2 IMPLANT
CANISTER SUCT 3000ML PPV (MISCELLANEOUS) ×2 IMPLANT
CLIP VESOCCLUDE MED 24/CT (CLIP) ×2 IMPLANT
CLIP VESOCCLUDE SM WIDE 24/CT (CLIP) ×2 IMPLANT
COVER PROBE W GEL 5X96 (DRAPES) ×2 IMPLANT
COVER WAND RF STERILE (DRAPES) ×2 IMPLANT
DECANTER SPIKE VIAL GLASS SM (MISCELLANEOUS) IMPLANT
DERMABOND ADVANCED (GAUZE/BANDAGES/DRESSINGS) ×2
DERMABOND ADVANCED .7 DNX12 (GAUZE/BANDAGES/DRESSINGS) ×2 IMPLANT
ELECT REM PT RETURN 9FT ADLT (ELECTROSURGICAL) ×2
ELECTRODE REM PT RTRN 9FT ADLT (ELECTROSURGICAL) ×1 IMPLANT
GLOVE BIO SURGEON STRL SZ7.5 (GLOVE) ×2 IMPLANT
GLOVE BIOGEL PI IND STRL 8 (GLOVE) ×1 IMPLANT
GLOVE BIOGEL PI INDICATOR 8 (GLOVE) ×1
GOWN STRL REUS W/ TWL LRG LVL3 (GOWN DISPOSABLE) ×1 IMPLANT
GOWN STRL REUS W/ TWL XL LVL3 (GOWN DISPOSABLE) ×2 IMPLANT
GOWN STRL REUS W/TWL LRG LVL3 (GOWN DISPOSABLE) ×1
GOWN STRL REUS W/TWL XL LVL3 (GOWN DISPOSABLE) ×2
HEMOSTAT SPONGE AVITENE ULTRA (HEMOSTASIS) IMPLANT
KIT BASIN OR (CUSTOM PROCEDURE TRAY) ×2 IMPLANT
KIT TURNOVER KIT B (KITS) ×2 IMPLANT
NEEDLE HYPO 25GX1X1/2 BEV (NEEDLE) ×2 IMPLANT
NS IRRIG 1000ML POUR BTL (IV SOLUTION) ×2 IMPLANT
PACK CV ACCESS (CUSTOM PROCEDURE TRAY) ×2 IMPLANT
PAD ARMBOARD 7.5X6 YLW CONV (MISCELLANEOUS) ×4 IMPLANT
SUT MNCRL AB 4-0 PS2 18 (SUTURE) ×6 IMPLANT
SUT PROLENE 6 0 BV (SUTURE) ×4 IMPLANT
SUT PROLENE 7 0 BV 1 (SUTURE) IMPLANT
SUT SILK 2 0 SH (SUTURE) ×2 IMPLANT
SUT VIC AB 2-0 CT1 27 (SUTURE)
SUT VIC AB 2-0 CT1 TAPERPNT 27 (SUTURE) IMPLANT
SUT VIC AB 3-0 SH 27 (SUTURE) ×2
SUT VIC AB 3-0 SH 27X BRD (SUTURE) ×2 IMPLANT
TOWEL GREEN STERILE (TOWEL DISPOSABLE) ×2 IMPLANT
UNDERPAD 30X30 (UNDERPADS AND DIAPERS) ×2 IMPLANT
WATER STERILE IRR 1000ML POUR (IV SOLUTION) ×2 IMPLANT

## 2018-08-27 NOTE — Anesthesia Postprocedure Evaluation (Signed)
Anesthesia Post Note  Patient: Albert Eaton  Procedure(s) Performed: Left arm BRACHIOBASILIC VEIN TRANSPOSITION SECOND STAGE (Left Arm Upper)     Patient location during evaluation: PACU Anesthesia Type: General Level of consciousness: awake and alert Pain management: pain level controlled Vital Signs Assessment: post-procedure vital signs reviewed and stable Respiratory status: spontaneous breathing, nonlabored ventilation, respiratory function stable and patient connected to nasal cannula oxygen Cardiovascular status: blood pressure returned to baseline and stable Postop Assessment: no apparent nausea or vomiting Anesthetic complications: no    Last Vitals:  Vitals:   08/27/18 1240 08/27/18 1300  BP: (!) 136/96 (!) 133/97  Pulse: 76 73  Resp: 10 11  Temp: 36.9 C 36.9 C  SpO2: 94% 94%    Last Pain:  Vitals:   08/27/18 1240  TempSrc:   PainSc: Asleep                 Gregroy Dombkowski P Zylee Marchiano

## 2018-08-27 NOTE — H&P (Signed)
History and Physical Interval Note:  08/27/2018 9:39 AM  Albert Eaton  has presented today for surgery, with the diagnosis of chronic kidney disease  The various methods of treatment have been discussed with the patient and family. After consideration of risks, benefits and other options for treatment, the patient has consented to  Procedure(s): BRACHIOBASILIC VEIN TRANSPOSITION SECOND STAGE (Left) as a surgical intervention .  The patient's history has been reviewed, patient examined, no change in status, stable for surgery.  I have reviewed the patient's chart and labs.  Questions were answered to the patient's satisfaction.     Second stage left brachiobasilic fistula.  Marty Heck  Patient name: Albert Eaton        MRN: [id number]        DOB: July 22, 1966            Sex: male  REASON FOR VISIT: Post-op  HPI: Albert Eaton is a 52 y.o. male with stage IV chronic kidney disease that presents for one-month follow-up after left brachiobasilic first stage fistula placement.  Overall he is done well in the interim he has no weakness or numbness or tingling in his left hand.  He does describe some mild hyper hyperesthesia of his left forearm.  He had his fistula duplex today and states he can still feel a thrill in the fistula.      Past Medical History:  Diagnosis Date  . Anemia   . Chronic diastolic CHF (congestive heart failure) ([place]) 01/06/2017   Echo 7/16 [facility] in [place], [name]) Mild AI, mild LAE, mild concentric LVH, EF 55, normal wall motion, mild to moderate MR, mild PI, RVSP 55 // Echo 10/09/16 ([facility]):  Moderate LVH, grade 2 diastolic dysfunction, mild MR, moderate LAE   . CKD (chronic kidney disease)    per pt, waiting on a kidney transplant in near future.  . Gout   . Hepatitis    Hep B  . History of nuclear stress test    a. Nuc study 7/16: no scar or ischemia, EF 42 // b. Nuc study 12/17: EF 48, ?small apical ischemia, Low Risk  . HIV (human  immunodeficiency virus infection) ([place])   . HLD (hyperlipidemia)   . Hypertension   . Hypertensive heart disease with CHF (congestive heart failure) ([place]) 10/09/2016  . OSA (obstructive sleep apnea)   . Post-operative nausea and vomiting    1 time as a child  . Sleep apnea    uses c-pap  . Wears glasses   . Wears partial dentures          Past Surgical History:  Procedure Laterality Date  . AV FISTULA PLACEMENT Left 07/02/2018   Procedure: Creation of Left arm BRACHIOBASILIC ARTERIOVENOUS  FISTULA;  Surgeon: Marty Heck, MD;  Location: [facility];  Service: Vascular;  Laterality: Left;  . CHOLECYSTECTOMY    . COLONOSCOPY W/ BIOPSIES AND POLYPECTOMY    . GIVENS CAPSULE STUDY N/A 03/01/2018   Procedure: GIVENS CAPSULE STUDY;  Surgeon: Jerene Bears, MD;  Location: [facility];  Service: Gastroenterology;  Laterality: N/A;  . HERNIA REPAIR     As baby  . MULTIPLE TOOTH EXTRACTIONS    . NOSE SURGERY    . RENAL BIOPSY           Family History  Problem Relation Age of Onset  . Heart failure Mother   . Heart attack Maternal Grandmother 53  . Hypertension Sister   . Multiple sclerosis Sister   .  Hypertension Brother   . Hypertension Sister   . Scoliosis Other   . Allergic rhinitis Neg Hx   . Angioedema Neg Hx   . Asthma Neg Hx   . Eczema Neg Hx   . Immunodeficiency Neg Hx   . Urticaria Neg Hx     SOCIAL HISTORY: Social History        Tobacco Use  . Smoking status: Former Smoker    Types: Cigarettes    Last attempt to quit: 2000    Years since quitting: 19.8  . Smokeless tobacco: Never Used  Substance Use Topics  . Alcohol use: Not Currently    Alcohol/week: 2.0 standard drinks    Types: 2 Shots of liquor per week        Allergies  Allergen Reactions  . Lisinopril Cough  . Ace Inhibitors Cough          Current Outpatient Medications  Medication Sig Dispense Refill  . abacavir (ZIAGEN) 300 MG  tablet Take 1 tablet (300 mg total) by mouth daily. 30 tablet 8  . albuterol (PROAIR HFA) 108 (90 Base) MCG/ACT inhaler Inhale 2 puffs into the lungs every 4 (four) hours as needed for wheezing or shortness of breath. 1 Inhaler 3  . allopurinol (ZYLOPRIM) 100 MG tablet Take 0.5 tablets (50 mg total) by mouth 2 (two) times daily. 60 tablet 0  . allopurinol (ZYLOPRIM) 100 MG tablet TAKE 1 TABLET(100 MG) BY MOUTH TWICE DAILY 60 tablet 0  . amLODipine (NORVASC) 10 MG tablet Take 1 tablet (10 mg total) by mouth daily. 30 tablet 1  . azelastine (ASTELIN) 0.1 % nasal spray Place 2 sprays into both nostrils 2 (two) times daily as needed for rhinitis. Use in each nostril as directed 30 mL 5  . carvedilol (COREG) 25 MG tablet TAKE 1 TABLET BY MOUTH TWICE DAILY WITH FOOD 60 tablet 11  . cloNIDine (CATAPRES) 0.1 MG tablet Take 1 tablet (0.1 mg total) by mouth 2 (two) times daily. 180 tablet 2  . famotidine (PEPCID) 20 MG tablet   9  . gabapentin (NEURONTIN) 100 MG capsule Take 1 capsule (100 mg total) by mouth 3 (three) times daily as needed (pain). 90 capsule 0  . hydrALAZINE (APRESOLINE) 100 MG tablet TAKE 1 TABLET BY MOUTH THREE TIMES DAILY (Patient taking differently: Take 100 mg by mouth 3 (three) times daily. ) 90 tablet 8  . ipratropium (ATROVENT) 0.06 % nasal spray 2 sprays per nostril 2 to 3 times daily as needed (Patient taking differently: Place 2 sprays into both nostrils 3 (three) times daily as needed for rhinitis. ) 15 mL 5  . lamivudine (EPIVIR) 100 MG tablet Take 1 tablet (100 mg total) by mouth daily. 90 tablet 2  . levocetirizine (XYZAL) 5 MG tablet Take 1 tablet (5 mg total) by mouth every evening. 30 tablet 11  . levothyroxine (SYNTHROID, LEVOTHROID) 25 MCG tablet TAKE 1 TABLET(25 MCG) BY MOUTH DAILY BEFORE BREAKFAST 30 tablet 0  . metolazone (ZAROXOLYN) 2.5 MG tablet TK 1 T PO ONCE A WEEK PRF FLUID BUILD-UP UTD  1  . Olopatadine HCl (PAZEO) 0.7 % SOLN Place 1 drop into both eyes daily.  (Patient taking differently: Place 1 drop into both eyes daily as needed (dry eyes). ) 1 Bottle 5  . potassium chloride SA (K-DUR,KLOR-CON) 20 MEQ tablet Take 1 tablet (20 mEq total) by mouth 2 (two) times daily. 180 tablet 2  . raltegravir (ISENTRESS) 400 MG tablet Take 1 tablet (400 mg total)  by mouth 2 (two) times daily. 60 tablet 8  . spironolactone (ALDACTONE) 25 MG tablet TAKE 1 TABLET(25 MG) BY MOUTH DAILY 30 tablet 0  . torsemide (DEMADEX) 100 MG tablet Take 1 tablet (100 mg total) by mouth 2 (two) times daily. 60 tablet 3  . triamcinolone cream (KENALOG) 0.1 % Apply 1 application topically 2 (two) times daily. (Patient taking differently: Apply 1 application topically 2 (two) times daily as needed (skin irritation). ) 30 g 0  . Turmeric 400 MG CAPS Take by mouth.    . Vitamin D, Ergocalciferol, (DRISDOL) 50000 units CAPS capsule Take 1 capsule (50,000 Units total) by mouth every 7 (seven) days. 8 capsule 0  . zinc gluconate 50 MG tablet Take 100 mg by mouth daily.    Marland Kitchen oxyCODONE-acetaminophen (PERCOCET) 5-325 MG tablet Take 1 tablet by mouth every 6 (six) hours as needed for severe pain. (Patient not taking: Reported on 08/10/2018) 8 tablet 0   No current facility-administered medications for this visit.     REVIEW OF SYSTEMS:  [X]  denotes positive finding, [ ]  denotes negative finding Cardiac  Comments:  Chest pain or chest pressure:    Shortness of breath upon exertion:    Short of breath when lying flat:    Irregular heart rhythm:        Vascular    Pain in calf, thigh, or hip brought on by ambulation:    Pain in feet at night that wakes you up from your sleep:     Blood clot in your veins:    Leg swelling:         Pulmonary    Oxygen at home:    Productive cough:     Wheezing:         Neurologic    Sudden weakness in arms or legs:     Sudden numbness in arms or legs:     Sudden onset of difficulty speaking or slurred  speech:    Temporary loss of vision in one eye:     Problems with dizziness:         Gastrointestinal    Blood in stool:     Vomited blood:         Genitourinary    Burning when urinating:     Blood in urine:        Psychiatric    Major depression:         Hematologic    Bleeding problems:    Problems with blood clotting too easily:        Skin    Rashes or ulcers:        Constitutional    Fever or chills:      PHYSICAL EXAM:    Vitals:   08/10/18 0905  BP: 140/83  Pulse: 72  Resp: 18  Temp: 98.4 F (36.9 C)  TempSrc: Oral  SpO2: 98%  Weight: 98.9 kg  Height: 5' 10"  (1.778 m)    GENERAL: The patient is a well-nourished male, in no acute distress. The vital signs are documented above. VASCULAR:  2+ palpable left radial pulse Good thrill in left brachiobasilic fistula Incision well healed PULMONARY: No respiratory distress   DATA:   I independently reviewed his fistula duplex that shows a nice flow volume of 2391 in the fistula with a nicely dilated basilic vein in the upper arm over 6 mm.  Assessment/Plan:  52 year old male with stage IV chronic kidney disease status post first stage left upper extremity  brachial basilic fistula.  This appears to be maturing nicely and has good flow volumes with a nicely dilated basilic vein in the upper arm.  He has a good thrill in the fistula on exam today.  We will plan for scheduling his second stage transposition.  We discussed ongoing risks and benefits of surgery including bleeding, infection, steal.   Marty Heck, MD [facility] Office: [phone] Pager: [phone]

## 2018-08-27 NOTE — Telephone Encounter (Signed)
sch appt lvm mld ltr 09/20/18 1pm p/o PA

## 2018-08-27 NOTE — Transfer of Care (Signed)
Immediate Anesthesia Transfer of Care Note  Patient: Albert Eaton  Procedure(s) Performed: Left arm BRACHIOBASILIC VEIN TRANSPOSITION SECOND STAGE (Left Arm Upper)  Patient Location: PACU  Anesthesia Type:General  Level of Consciousness: awake, alert , oriented and patient cooperative  Airway & Oxygen Therapy: Patient Spontanous Breathing and Patient connected to nasal cannula oxygen  Post-op Assessment: Report given to RN and Post -op Vital signs reviewed and stable  Post vital signs: Reviewed and stable  Last Vitals:  Vitals Value Taken Time  BP    Temp    Pulse 80 08/27/2018 12:10 PM  Resp 18 08/27/2018 12:10 PM  SpO2 97 % 08/27/2018 12:10 PM  Vitals shown include unvalidated device data.  Last Pain:  Vitals:   08/27/18 0732  TempSrc: Oral  PainSc:       Patients Stated Pain Goal: 0 (71/27/87 1836)  Complications: No apparent anesthesia complications

## 2018-08-27 NOTE — Anesthesia Procedure Notes (Signed)
Procedure Name: LMA Insertion Date/Time: 08/27/2018 10:21 AM Performed by: Renato Shin, CRNA Pre-anesthesia Checklist: Patient identified, Emergency Drugs available, Suction available and Patient being monitored Patient Re-evaluated:Patient Re-evaluated prior to induction Oxygen Delivery Method: Circle system utilized Preoxygenation: Pre-oxygenation with 100% oxygen Induction Type: IV induction LMA: LMA inserted LMA Size: 4.0 Number of attempts: 1 Placement Confirmation: positive ETCO2,  CO2 detector and breath sounds checked- equal and bilateral Tube secured with: Tape Dental Injury: Teeth and Oropharynx as per pre-operative assessment

## 2018-08-27 NOTE — Telephone Encounter (Signed)
-----   Message from Dagoberto Ligas, PA-C sent at 08/27/2018 12:02 PM EDT -----  Can you schedule an appt for this pt in 2-3 weeks on [facility] clinic.  PO L arm 2nd stage basilic fistula. Thanks, [facility]

## 2018-08-27 NOTE — Discharge Instructions (Signed)
° °  [facility] ° °Discharge Instructions ° °AV Fistula or Graft Surgery for Dialysis Access ° °Please refer to the following instructions for your post-procedure care. Your surgeon or physician assistant will discuss any changes with you. ° °Activity ° °You may drive the day following your surgery, if you are comfortable and no longer taking prescription pain medication. Resume full activity as the soreness in your incision resolves. ° °Bathing/Showering ° °You may shower after you go home. Keep your incision dry for 48 hours. Do not soak in a bathtub, hot tub, or swim until the incision heals completely. You may not shower if you have a hemodialysis catheter. ° °Incision Care ° °Clean your incision with mild soap and water after 48 hours. Pat the area dry with a clean towel. You do not need a bandage unless otherwise instructed. Do not apply any ointments or creams to your incision. You may have skin glue on your incision. Do not peel it off. It will come off on its own in about one week. Your arm may swell a bit after surgery. To reduce swelling use pillows to elevate your arm so it is above your heart. Your doctor will tell you if you need to lightly wrap your arm with an ACE bandage. ° °Diet ° °Resume your normal diet. There are not special food restrictions following this procedure. In order to heal from your surgery, it is CRITICAL to get adequate nutrition. Your body requires vitamins, minerals, and protein. Vegetables are the best source of vitamins and minerals. Vegetables also provide the perfect balance of protein. Processed food has little nutritional value, so try to avoid this. ° °Medications ° °Resume taking all of your medications. If your incision is causing pain, you may take over-the counter pain relievers such as acetaminophen (Tylenol). If you were prescribed a stronger pain medication, please be aware these medications can cause nausea and constipation. Prevent  nausea by taking the medication with a snack or meal. Avoid constipation by drinking plenty of fluids and eating foods with high amount of fiber, such as fruits, vegetables, and grains. Do not take Tylenol if you are taking prescription pain medications. ° ° ° ° °Follow up °Your surgeon may want to see you in the office following your access surgery. If so, this will be arranged at the time of your surgery. ° °Please call us immediately for any of the following conditions: ° °Increased pain, redness, drainage (pus) from your incision site °Fever of 101 degrees or higher °Severe or worsening pain at your incision site °Hand pain or numbness. ° °Reduce your risk of vascular disease: ° °Stop smoking. If you would like help, call [facility] at 1-800-QUIT-NOW (1-800-784-8669) or [place] at 336-586-4000 ° °Manage your cholesterol °Maintain a desired weight °Control your diabetes °Keep your blood pressure down ° °Dialysis ° °It will take several weeks to several months for your new dialysis access to be ready for use. Your surgeon will determine when it is OK to use it. Your nephrologist will continue to direct your dialysis. You can continue to use your Permcath until your new access is ready for use. ° °If you have any questions, please call the office at [phone]. ° °

## 2018-08-27 NOTE — Op Note (Signed)
    OPERATIVE NOTE   PROCEDURE: left second stage basilic vein transposition (brachiobasilic arteriovenous fistula) placement  PRE-OPERATIVE DIAGNOSIS: CKD  POST-OPERATIVE DIAGNOSIS: same  SURGEON: Marty Heck, MD  ASSISTANT(S): Arlee Muslim, PA  ANESTHESIA: general  ESTIMATED BLOOD LOSS: Minimal  FINDING(S): Nicely dilated basilic vein with good thrill after transposition.  SPECIMEN(S):  None  INDICATIONS:   Albert Eaton is a 52 y.o. male who presents with CKD for second stage left basilic vein transposition.  The patient is scheduled for left second stage basilic vein transposition.  The patient is aware the risks include but are not limited to: bleeding, infection, steal syndrome, nerve damage, ischemic monomelic neuropathy, failure to mature, and need for additional procedures.  The patient is aware of the risks of the procedure and elects to proceed forward.  DESCRIPTION: After full informed written consent was obtained from the patient, the patient was brought back to the operating room and placed supine upon the operating table.  Prior to induction, the patient received IV antibiotics.   After obtaining adequate anesthesia, the patient was then prepped and draped in the standard fashion for a left arm access procedure.  I turned my attention first to identifying the patient's brachiobasilic arteriovenous fistula.  Using SonoSite guidance, the location of this fistula was marked out on the skin.    This was an excellent caliber vein.  I made three longitudinal incisions on the medial aspect of the left upper arm.  Through these incisions I dissected out circumferentially the basilic vein, taking care to protect the nerve.  Once the vein was fully mobilized, all side branches were ligated between silk ties.  The vein was marked for orientation.  I then used a curved tunneler to create a subcutaneous tunnel.  The vein was then transected near the antecubital crease.  It was  then brought to the previously created tunnel making sure to maintain proper orientation.  A primary anastomosis was then performed between the two cut ends of the vein with a running 5-0 Prolene.  Once this was done the clamps were released.  There was excellent flow through the fistula.  Hemostasis was then achieved.  The wound was irrigated.  The incision was closed with a deep layer of 3-0 Vicryl followed by a subcutaneous 4-0 Monocryl and Dermabond.  There were no immediate complications.  COMPLICATIONS: None  CONDITION: Stable  Marty Heck, MD [facility] Office: [phone] Pager: [phone]  08/27/2018, 11:54 AM   Marty Heck

## 2018-08-27 NOTE — Anesthesia Preprocedure Evaluation (Addendum)
Anesthesia Evaluation  Patient identified by MRN, date of birth, ID band Patient awake    Reviewed: Allergy & Precautions, NPO status , Patient's Chart, lab work & pertinent test results, reviewed documented beta blocker date and time   History of Anesthesia Complications (+) PONV and history of anesthetic complications  Airway Mallampati: III  TM Distance: >3 FB Neck ROM: Full    Dental  (+) Missing,    Pulmonary sleep apnea and Continuous Positive Airway Pressure Ventilation , former smoker,    Pulmonary exam normal breath sounds clear to auscultation       Cardiovascular hypertension, Pt. on medications and Pt. on home beta blockers +CHF  Normal cardiovascular exam Rhythm:Regular Rate:Normal  ECG: NSR, rate 64  ECHO: LV EF: 60% -   65%  Pt under the care of Dr. [name], Cardiology   Neuro/Psych negative neurological ROS  negative psych ROS   GI/Hepatic negative GI ROS, (+) Hepatitis -  Endo/Other  Hypothyroidism   Renal/GU ESRFRenal disease     Musculoskeletal Gout   Abdominal (+) + obese,   Peds  Hematology  (+) anemia , HIV,   Anesthesia Other Findings Chronic kidney disease  Reproductive/Obstetrics                            Anesthesia Physical Anesthesia Plan  ASA: IV  Anesthesia Plan: General   Post-op Pain Management:    Induction: Intravenous  PONV Risk Score and Plan: 3 and Ondansetron, Dexamethasone, Treatment may vary due to age or medical condition and Propofol infusion  Airway Management Planned: LMA  Additional Equipment:   Intra-op Plan:   Post-operative Plan: Extubation in OR  Informed Consent: I have reviewed the patients History and Physical, chart, labs and discussed the procedure including the risks, benefits and alternatives for the proposed anesthesia with the patient or authorized representative who has indicated his/her understanding and  acceptance.   Dental advisory given  Plan Discussed with: CRNA  Anesthesia Plan Comments:        Anesthesia Quick Evaluation

## 2018-08-28 ENCOUNTER — Other Ambulatory Visit: Payer: Self-pay | Admitting: Family Medicine

## 2018-08-28 ENCOUNTER — Encounter (HOSPITAL_COMMUNITY): Payer: Self-pay | Admitting: Vascular Surgery

## 2018-08-30 ENCOUNTER — Encounter: Payer: Self-pay | Admitting: Allergy and Immunology

## 2018-08-30 ENCOUNTER — Ambulatory Visit (INDEPENDENT_AMBULATORY_CARE_PROVIDER_SITE_OTHER): Payer: Medicaid Other | Admitting: Allergy and Immunology

## 2018-08-30 VITALS — BP 126/80 | HR 76 | Resp 16

## 2018-08-30 DIAGNOSIS — H1013 Acute atopic conjunctivitis, bilateral: Secondary | ICD-10-CM | POA: Diagnosis not present

## 2018-08-30 DIAGNOSIS — J4520 Mild intermittent asthma, uncomplicated: Secondary | ICD-10-CM | POA: Diagnosis not present

## 2018-08-30 DIAGNOSIS — J310 Chronic rhinitis: Secondary | ICD-10-CM | POA: Diagnosis not present

## 2018-08-30 DIAGNOSIS — J3089 Other allergic rhinitis: Secondary | ICD-10-CM

## 2018-08-30 MED ORDER — IPRATROPIUM BROMIDE 0.06 % NA SOLN: NASAL | 5 refills | Status: DC

## 2018-08-30 MED ORDER — AZELASTINE HCL 0.1 % NA SOLN: 2.0000 | Freq: Two times a day (BID) | NASAL | 5 refills | Status: DC | PRN

## 2018-08-30 NOTE — Patient Instructions (Addendum)
Perennial allergic rhinitis with a predominant nonallergic component  Continue appropriate allergen avoidance measures.  I have recommended using azelastine nasal spray and/or ipratropium nasal spray on more of a regular basis in an attempt to control symptoms.  Nasal saline spray (i.e., Simply Saline) or nasal saline lavage (i.e., NeilMed) is recommended as needed and prior to medicated nasal sprays.  Allergic conjunctivitis  Treatment plan as outlined above for allergic rhinitis.  Continue olopatadine eyedrops, 1 drop per eye daily when needed.  Eye lubricant drops (i.e., Natural Tears) are recommended as needed.  Mild intermittent asthma  Continue albuterol HFA, 1-2 elations every 4-6 hours if needed.  Subjective and objective measures of pulmonary function will be followed and the treatment plan will be adjusted accordingly.   Return in about 6 months (around 02/28/2019), or if symptoms worsen or fail to improve.

## 2018-08-30 NOTE — Assessment & Plan Note (Addendum)
   Treatment plan as outlined above for allergic rhinitis.  Continue olopatadine eyedrops, 1 drop per eye daily when needed.  Eye lubricant drops (i.e., Natural Tears) are recommended as needed.

## 2018-08-30 NOTE — Assessment & Plan Note (Addendum)
   Continue appropriate allergen avoidance measures.  I have recommended using azelastine nasal spray and/or ipratropium nasal spray on more of a regular basis in an attempt to control symptoms.  Nasal saline spray (i.e., Simply Saline) or nasal saline lavage (i.e., NeilMed) is recommended as needed and prior to medicated nasal sprays.

## 2018-08-30 NOTE — Progress Notes (Signed)
Follow-up Note  RE: Albert Eaton [place] MRN: [id number] DOB: 01/12/1966 Date of Office Visit: 08/30/2018  Primary care provider: Lanae Boast, [place] Referring provider: [name], FNP  History of present illness: Albert Eaton is a 52 y.o. male with with a complex medical history, allergic rhinoconjunctivitis, and history of wheezing presenting today for follow-up.  He was last seen in this clinic on Mar 09, 2018.  He reports that he still experiences nasal congestion, rhinorrhea, and sneezing "attacks".  So experiences occasional ocular pruritus and lacrimation.  He experiences these bouts of symptoms associated with rhinoconjunctivitis every 3 or 4 weeks on average.  He only uses his allergy medications "during attacks."  He experiences occasional episodes of coughing and dyspnea with adequate, temporary relief from albuterol.  Assessment and plan: Perennial allergic rhinitis with a predominant nonallergic component  Continue appropriate allergen avoidance measures.  I have recommended using azelastine nasal spray and/or ipratropium nasal spray on more of a regular basis in an attempt to control symptoms.  Nasal saline spray (i.e., Simply Saline) or nasal saline lavage (i.e., NeilMed) is recommended as needed and prior to medicated nasal sprays.  Allergic conjunctivitis  Treatment plan as outlined above for allergic rhinitis.  Continue olopatadine eyedrops, 1 drop per eye daily when needed.  Eye lubricant drops (i.e., Natural Tears) are recommended as needed.  Mild intermittent asthma  Continue albuterol HFA, 1-2 elations every 4-6 hours if needed.  Subjective and objective measures of pulmonary function will be followed and the treatment plan will be adjusted accordingly.   Meds ordered this encounter  Medications  . azelastine (ASTELIN) 0.1 % nasal spray    Sig: Place 2 sprays into both nostrils 2 (two) times daily as needed for rhinitis. Use in each nostril as  directed    Dispense:  30 mL    Refill:  5  . ipratropium (ATROVENT) 0.06 % nasal spray    Sig: 2 sprays per nostril 2 to 3 times daily as needed    Dispense:  15 mL    Refill:  5    Diagnostics: Spirometry:  Normal with an FEV1 of 90% predicted with an FEV1 ratio of 103%.  Please see scanned spirometry results for details.    Physical examination: Blood pressure 126/80, pulse 76, resp. rate 16.  General: Alert, interactive, in no acute distress. HEENT: TMs pearly gray, turbinates moderately edematous without discharge, post-pharynx mildly erythematous. Neck: Supple without lymphadenopathy. Lungs: Clear to auscultation without wheezing, rhonchi or rales. CV: Normal S1, S2 without murmurs. Skin: Warm and dry, without lesions or rashes.  The following portions of the patient's history were reviewed and updated as appropriate: allergies, current medications, past family history, past medical history, past social history, past surgical history and problem list.  Allergies as of 08/30/2018      Reactions   Ace Inhibitors Cough   Lisinopril Cough      Medication List        Accurate as of 08/30/18  9:27 PM. Always use your most recent med list.          abacavir 300 MG tablet Commonly known as:  ZIAGEN Take 1 tablet (300 mg total) by mouth daily.   albuterol 108 (90 Base) MCG/ACT inhaler Commonly known as:  PROVENTIL HFA;VENTOLIN HFA Inhale 2 puffs into the lungs every 4 (four) hours as needed for wheezing or shortness of breath.   allopurinol 100 MG tablet Commonly known as:  ZYLOPRIM TAKE 1 TABLET(100 MG) BY MOUTH  TWICE DAILY   amLODipine 10 MG tablet Commonly known as:  NORVASC Take 1 tablet (10 mg total) by mouth daily.   azelastine 0.1 % nasal spray Commonly known as:  ASTELIN Place 2 sprays into both nostrils 2 (two) times daily as needed for rhinitis. Use in each nostril as directed   carvedilol 25 MG tablet Commonly known as:  COREG TAKE 1 TABLET BY MOUTH  TWICE DAILY WITH FOOD   cloNIDine 0.1 MG tablet Commonly known as:  CATAPRES Take 1 tablet (0.1 mg total) by mouth 2 (two) times daily.   famotidine 20 MG tablet Commonly known as:  PEPCID Take 20 mg by mouth daily as needed for indigestion.   gabapentin 300 MG capsule Commonly known as:  NEURONTIN Take 300 mg by mouth 3 (three) times daily.   gabapentin 100 MG capsule Commonly known as:  NEURONTIN Take 1 capsule (100 mg total) by mouth 3 (three) times daily as needed (pain).   hydrALAZINE 100 MG tablet Commonly known as:  APRESOLINE TAKE 1 TABLET BY MOUTH THREE TIMES DAILY   ipratropium 0.06 % nasal spray Commonly known as:  ATROVENT 2 sprays per nostril 2 to 3 times daily as needed   lamivudine 100 MG tablet Commonly known as:  EPIVIR Take 1 tablet (100 mg total) by mouth daily.   levocetirizine 5 MG tablet Commonly known as:  XYZAL Take 1 tablet (5 mg total) by mouth every evening.   levothyroxine 25 MCG tablet Commonly known as:  SYNTHROID, LEVOTHROID TAKE 1 TABLET(25 MCG) BY MOUTH DAILY BEFORE BREAKFAST   Olopatadine HCl 0.7 % Soln Place 1 drop into both eyes daily.   oxyCODONE-acetaminophen 5-325 MG tablet Commonly known as:  PERCOCET/ROXICET Take 1 tablet by mouth every 6 (six) hours as needed for up to 15 doses for severe pain.   potassium chloride SA 20 MEQ tablet Commonly known as:  K-DUR,KLOR-CON Take 1 tablet (20 mEq total) by mouth 2 (two) times daily.   raltegravir 400 MG tablet Commonly known as:  ISENTRESS Take 1 tablet (400 mg total) by mouth 2 (two) times daily.   spironolactone 25 MG tablet Commonly known as:  ALDACTONE TAKE 1 TABLET(25 MG) BY MOUTH DAILY   torsemide 100 MG tablet Commonly known as:  DEMADEX Take 1 tablet (100 mg total) by mouth 2 (two) times daily.   triamcinolone cream 0.1 % Commonly known as:  KENALOG Apply 1 application topically 2 (two) times daily.   Turmeric 500 MG Caps Take 500 mg by mouth daily.   Vitamin  D (Ergocalciferol) 50000 units Caps capsule Commonly known as:  DRISDOL TAKE 1 CAPSULE BY MOUTH EVERY 7 DAYS   zinc gluconate 50 MG tablet Take 50 mg by mouth daily.       Allergies  Allergen Reactions  . Ace Inhibitors Cough  . Lisinopril Cough   Review of systems: Review of systems negative except as noted in HPI / PMHx or noted below: Constitutional: Negative.  HENT: Negative.   Eyes: Negative.  Respiratory: Negative.   Cardiovascular: Negative.  Gastrointestinal: Negative.  Genitourinary: Negative.  Musculoskeletal: Negative.  Neurological: Negative.  Endo/Heme/Allergies: Negative.  Cutaneous: Negative.  Past Medical History:  Diagnosis Date  . Anemia   . Chronic diastolic CHF (congestive heart failure) ([place]) 01/06/2017   Echo 7/16 [facility] in [place], [name]) Mild AI, mild LAE, mild concentric LVH, EF 55, normal wall motion, mild to moderate MR, mild PI, RVSP 55 // Echo 10/09/16 ([facility]):  Moderate LVH, grade 2  diastolic dysfunction, mild MR, moderate LAE   . CKD (chronic kidney disease)    per pt, waiting on a kidney transplant in near future.  . Gout   . Hepatitis    Hep B  . History of nuclear stress test    a. Nuc study 7/16: no scar or ischemia, EF 42 // b. Nuc study 12/17: EF 48, ?small apical ischemia, Low Risk  . HIV (human immunodeficiency virus infection) ([place])   . HLD (hyperlipidemia)   . Hypertension   . Hypertensive heart disease with CHF (congestive heart failure) ([place]) 10/09/2016  . OSA (obstructive sleep apnea)   . Post-operative nausea and vomiting    1 time as a child  . Sleep apnea    uses c-pap  . Wears glasses   . Wears partial dentures     Family History  Problem Relation Age of Onset  . Heart failure Mother   . Heart attack Maternal Grandmother 53  . Hypertension Sister   . Multiple sclerosis Sister   . Hypertension Brother   . Hypertension Sister   . Scoliosis Other   . Allergic rhinitis Neg Hx   . Angioedema Neg Hx   .  Asthma Neg Hx   . Eczema Neg Hx   . Immunodeficiency Neg Hx   . Urticaria Neg Hx     Social History   Socioeconomic History  . Marital status: Single    Spouse name: Not on file  . Number of children: Not on file  . Years of education: Not on file  . Highest education level: Not on file  Occupational History  . Not on file  Social Needs  . Financial resource strain: Not on file  . Food insecurity:    Worry: Not on file    Inability: Not on file  . Transportation needs:    Medical: Not on file    Non-medical: Not on file  Tobacco Use  . Smoking status: Former Smoker    Types: Cigarettes    Last attempt to quit: 2000    Years since quitting: 19.8  . Smokeless tobacco: Never Used  Substance and Sexual Activity  . Alcohol use: Not Currently    Alcohol/week: 2.0 standard drinks    Types: 2 Shots of liquor per week  . Drug use: No  . Sexual activity: Never    Partners: Male    Birth control/protection: Condom    Comment: declined condoms  Lifestyle  . Physical activity:    Days per week: Not on file    Minutes per session: Not on file  . Stress: Not on file  Relationships  . Social connections:    Talks on phone: Not on file    Gets together: Not on file    Attends religious service: Not on file    Active member of club or organization: Not on file    Attends meetings of clubs or organizations: Not on file    Relationship status: Not on file  . Intimate partner violence:    Fear of current or ex partner: Not on file    Emotionally abused: Not on file    Physically abused: Not on file    Forced sexual activity: Not on file  Other Topics Concern  . Not on file  Social History Narrative  . Not on file    I appreciate the opportunity to take part in [place] care. Please do not hesitate to contact me with questions.  Sincerely,  Edmonia Lynch, MD

## 2018-08-30 NOTE — Assessment & Plan Note (Signed)
   Continue albuterol HFA, 1-2 elations every 4-6 hours if needed.  Subjective and objective measures of pulmonary function will be followed and the treatment plan will be adjusted accordingly.

## 2018-08-31 ENCOUNTER — Telehealth: Payer: Self-pay | Admitting: Cardiovascular Disease

## 2018-08-31 NOTE — Telephone Encounter (Signed)
  Patient called to make appt for 6 mo follow up in [place] and there are no appts available, slots are held by [employer]. Patient declined PA.

## 2018-08-31 NOTE — Telephone Encounter (Signed)
Spoke with pt and made him aware that Dr. Antionette Char nurse is out of the office today.  Advised I will send message to her and she will call if she can get him in sooner, otherwise he would be placed on the wait list in the event of any cancellations.  Pt verbalized understanding and was appreciative for call.

## 2018-09-01 NOTE — Telephone Encounter (Signed)
Scheduled patient Monday, Grandt 2, 2020 at 0840 with Dr. [name]. He was grateful for assistance.

## 2018-09-03 ENCOUNTER — Other Ambulatory Visit: Payer: Self-pay | Admitting: Family Medicine

## 2018-09-03 DIAGNOSIS — E039 Hypothyroidism, unspecified: Secondary | ICD-10-CM

## 2018-09-03 DIAGNOSIS — M109 Gout, unspecified: Secondary | ICD-10-CM

## 2018-09-03 DIAGNOSIS — R600 Localized edema: Secondary | ICD-10-CM

## 2018-09-07 ENCOUNTER — Ambulatory Visit: Payer: Medicaid Other | Admitting: Allergy and Immunology

## 2018-09-12 ENCOUNTER — Other Ambulatory Visit: Payer: Self-pay | Admitting: Cardiovascular Disease

## 2018-09-14 ENCOUNTER — Telehealth: Payer: Self-pay

## 2018-09-14 MED ORDER — GABAPENTIN 100 MG PO CAPS: 100.0000 mg | ORAL_CAPSULE | Freq: Three times a day (TID) | ORAL | 0 refills | Status: DC | PRN

## 2018-09-14 NOTE — Telephone Encounter (Signed)
Refill for gabapentin sent into pharmacy. Thanks!

## 2018-09-14 NOTE — Telephone Encounter (Signed)
Please advise on refill request. Thanks, MI

## 2018-09-20 ENCOUNTER — Other Ambulatory Visit: Payer: Self-pay

## 2018-09-20 ENCOUNTER — Ambulatory Visit (INDEPENDENT_AMBULATORY_CARE_PROVIDER_SITE_OTHER): Payer: Medicaid Other | Admitting: Family Medicine

## 2018-09-20 ENCOUNTER — Encounter: Payer: Self-pay | Admitting: Family Medicine

## 2018-09-20 ENCOUNTER — Ambulatory Visit (INDEPENDENT_AMBULATORY_CARE_PROVIDER_SITE_OTHER): Payer: Medicaid Other | Admitting: Physician Assistant

## 2018-09-20 VITALS — BP 154/93 | HR 76 | Temp 98.1°F | Resp 18 | Ht 70.0 in | Wt 219.0 lb

## 2018-09-20 VITALS — BP 136/91 | HR 69 | Temp 98.4°F | Resp 14 | Ht 70.0 in | Wt 218.0 lb

## 2018-09-20 DIAGNOSIS — E876 Hypokalemia: Secondary | ICD-10-CM | POA: Diagnosis not present

## 2018-09-20 DIAGNOSIS — I151 Hypertension secondary to other renal disorders: Secondary | ICD-10-CM

## 2018-09-20 DIAGNOSIS — N2889 Other specified disorders of kidney and ureter: Secondary | ICD-10-CM

## 2018-09-20 DIAGNOSIS — N184 Chronic kidney disease, stage 4 (severe): Secondary | ICD-10-CM

## 2018-09-20 LAB — POCT URINALYSIS DIPSTICK
Bilirubin, UA: NEGATIVE
Blood, UA: NEGATIVE
Glucose, UA: NEGATIVE
Ketones, UA: NEGATIVE
Leukocytes, UA: NEGATIVE
Nitrite, UA: NEGATIVE
Protein, UA: POSITIVE — AB
Spec Grav, UA: 1.02 (ref 1.010–1.025)
Urobilinogen, UA: 0.2 E.U./dL
pH, UA: 5.5 (ref 5.0–8.0)

## 2018-09-20 MED ORDER — POTASSIUM CHLORIDE CRYS ER 20 MEQ PO TBCR: 40.0000 meq | EXTENDED_RELEASE_TABLET | Freq: Two times a day (BID) | ORAL | 2 refills | Status: DC

## 2018-09-20 NOTE — Patient Instructions (Signed)
It was good seeing you today. I hope you have a wonderful holiday season. Please tell your nephew I said hello.   Chronic Kidney Disease, Adult Chronic kidney disease (CKD) happens when the kidneys are damaged during a time of 3 or more months. The kidneys are two organs that do many important jobs in the body. These jobs include:  Removing wastes and extra fluids from the blood.  Making hormones that maintain the amount of fluid in your tissues and blood vessels.  Making sure that the body has the right amount of fluids and chemicals.  Most of the time, this condition does not go away, but it can usually be controlled. Steps must be taken to slow down the kidney damage or stop it from getting worse. Otherwise, the kidneys may stop working. Follow these instructions at home:  Follow your diet as told by your doctor. You may need to avoid alcohol, salty foods (sodium), and foods that are high in potassium, calcium, and protein.  Take over-the-counter and prescription medicines only as told by your doctor. Do not take any new medicines unless your doctor says you can do that. These include vitamins and minerals. ? Medicines and nutritional supplements can make kidney damage worse. ? Your doctor may need to change how much medicine you take.  Do not use any tobacco products. These include cigarettes, chewing tobacco, and e-cigarettes. If you need help quitting, ask your doctor.  Keep all follow-up visits as told by your doctor. This is important.  Check your blood pressure. Tell your doctor if there are changes to your blood pressure.  Get to a healthy weight. Stay at that weight. If you need help with this, ask your doctor.  Start or continue an exercise plan. Try to exercise at least 30 minutes a day, 5 days a week.  Stay up-to-date with your shots (immunizations) as told by your doctor. Contact a doctor if:  Your symptoms get worse.  You have new symptoms. Get help right away  if:  You have symptoms of end-stage kidney disease. These include: ? Headaches. ? Skin that is darker or lighter than normal. ? Numbness in your hands or feet. ? Easy bruising. ? Having hiccups often. ? Chest pain. ? Shortness of breath. ? Stopping of menstrual periods in women.  You have a fever.  You are making very little pee (urine).  You have pain or bleeding when you pee (urinate). This information is not intended to replace advice given to you by your health care provider. Make sure you discuss any questions you have with your health care provider. Document Released: 01/07/2010 Document Revised: 03/20/2016 Document Reviewed: 06/11/2012 Elsevier Interactive Patient Education  2017 [employer].

## 2018-09-20 NOTE — Progress Notes (Signed)
Established Patient Office Visit  Subjective:  Patient ID: Albert Eaton, male    DOB: 1966-05-21  Age: 52 y.o. MRN: [id number]  CC:  Chief Complaint  Patient presents with  . Hypertension    HPI [name] presents for follow up for HTN, CKD and hypokalemia.  Patient states that he had his fistula placed in his left arm November 1. He is doing well and has mild pain with movement. Patient states that his spironolactone was discontinued by nephrologist due to suspicion of the cause of leg cramps. Since then, he has had decreased potassium. Patient states that he will most likely have to change from [facility] providers in the near future due to insurance changes.  Past Medical History:  Diagnosis Date  . Anemia   . Chronic diastolic CHF (congestive heart failure) ([place]) 01/06/2017   Echo 7/16 [facility] in [place], [name]) Mild AI, mild LAE, mild concentric LVH, EF 55, normal wall motion, mild to moderate MR, mild PI, RVSP 55 // Echo 10/09/16 ([facility]):  Moderate LVH, grade 2 diastolic dysfunction, mild MR, moderate LAE   . CKD (chronic kidney disease)    per pt, waiting on a kidney transplant in near future.  . Gout   . Hepatitis    Hep B  . History of nuclear stress test    a. Nuc study 7/16: no scar or ischemia, EF 42 // b. Nuc study 12/17: EF 48, ?small apical ischemia, Low Risk  . HIV (human immunodeficiency virus infection) ([place])   . HLD (hyperlipidemia)   . Hypertension   . Hypertensive heart disease with CHF (congestive heart failure) ([place]) 10/09/2016  . OSA (obstructive sleep apnea)   . Post-operative nausea and vomiting    1 time as a child  . Sleep apnea    uses c-pap  . Wears glasses   . Wears partial dentures     Past Surgical History:  Procedure Laterality Date  . AV FISTULA PLACEMENT Left 07/02/2018   Procedure: Creation of Left arm BRACHIOBASILIC ARTERIOVENOUS  FISTULA;  Surgeon: Marty Heck, MD;  Location: [place];  Service: Vascular;  Laterality: Left;   . BASCILIC VEIN TRANSPOSITION Left 08/27/2018   Procedure: Left arm BRACHIOBASILIC VEIN TRANSPOSITION SECOND STAGE;  Surgeon: Marty Heck, MD;  Location: [place];  Service: Vascular;  Laterality: Left;  . CHOLECYSTECTOMY    . COLONOSCOPY W/ BIOPSIES AND POLYPECTOMY    . GIVENS CAPSULE STUDY N/A 03/01/2018   Procedure: GIVENS CAPSULE STUDY;  Surgeon: Jerene Bears, MD;  Location: [place];  Service: Gastroenterology;  Laterality: N/A;  . HERNIA REPAIR     As baby  . MULTIPLE TOOTH EXTRACTIONS    . NOSE SURGERY    . RENAL BIOPSY      Family History  Problem Relation Age of Onset  . Heart failure Mother   . Heart attack Maternal Grandmother 53  . Hypertension Sister   . Multiple sclerosis Sister   . Hypertension Brother   . Hypertension Sister   . Scoliosis Other   . Allergic rhinitis Neg Hx   . Angioedema Neg Hx   . Asthma Neg Hx   . Eczema Neg Hx   . Immunodeficiency Neg Hx   . Urticaria Neg Hx     Social History   Socioeconomic History  . Marital status: Single    Spouse name: Not on file  . Number of children: Not on file  . Years of education: Not on file  .  Highest education level: Not on file  Occupational History  . Not on file  Social Needs  . Financial resource strain: Not on file  . Food insecurity:    Worry: Not on file    Inability: Not on file  . Transportation needs:    Medical: Not on file    Non-medical: Not on file  Tobacco Use  . Smoking status: Former Smoker    Types: Cigarettes    Last attempt to quit: 2000    Years since quitting: 19.9  . Smokeless tobacco: Never Used  Substance and Sexual Activity  . Alcohol use: Not Currently    Alcohol/week: 2.0 standard drinks    Types: 2 Shots of liquor per week  . Drug use: No  . Sexual activity: Never    Partners: Male    Birth control/protection: Condom    Comment: declined condoms  Lifestyle  . Physical activity:    Days per week: Not on file    Minutes per session: Not on file   . Stress: Not on file  Relationships  . Social connections:    Talks on phone: Not on file    Gets together: Not on file    Attends religious service: Not on file    Active member of club or organization: Not on file    Attends meetings of clubs or organizations: Not on file    Relationship status: Not on file  . Intimate partner violence:    Fear of current or ex partner: Not on file    Emotionally abused: Not on file    Physically abused: Not on file    Forced sexual activity: Not on file  Other Topics Concern  . Not on file  Social History Narrative  . Not on file    Outpatient Medications Prior to Visit  Medication Sig Dispense Refill  . abacavir (ZIAGEN) 300 MG tablet Take 1 tablet (300 mg total) by mouth daily. 30 tablet 8  . albuterol (PROAIR HFA) 108 (90 Base) MCG/ACT inhaler Inhale 2 puffs into the lungs every 4 (four) hours as needed for wheezing or shortness of breath. 1 Inhaler 3  . allopurinol (ZYLOPRIM) 100 MG tablet TAKE 1 TABLET(100 MG) BY MOUTH TWICE DAILY 60 tablet 0  . amLODipine (NORVASC) 10 MG tablet Take 1 tablet (10 mg total) by mouth daily. 30 tablet 1  . azelastine (ASTELIN) 0.1 % nasal spray Place 2 sprays into both nostrils 2 (two) times daily as needed for rhinitis. Use in each nostril as directed 30 mL 5  . carvedilol (COREG) 25 MG tablet TAKE 1 TABLET BY MOUTH TWICE DAILY WITH FOOD 60 tablet 11  . cloNIDine (CATAPRES) 0.1 MG tablet Take 1 tablet (0.1 mg total) by mouth 2 (two) times daily. 180 tablet 2  . famotidine (PEPCID) 20 MG tablet Take 20 mg by mouth daily as needed for indigestion.   9  . gabapentin (NEURONTIN) 100 MG capsule Take 1 capsule (100 mg total) by mouth 3 (three) times daily as needed (pain). 90 capsule 0  . hydrALAZINE (APRESOLINE) 100 MG tablet TAKE 1 TABLET BY MOUTH THREE TIMES DAILY (Patient taking differently: Take 100 mg by mouth 3 (three) times daily. ) 90 tablet 8  . ipratropium (ATROVENT) 0.06 % nasal spray 2 sprays per  nostril 2 to 3 times daily as needed 15 mL 5  . lamivudine (EPIVIR) 100 MG tablet Take 1 tablet (100 mg total) by mouth daily. 90 tablet 2  . levothyroxine (SYNTHROID,  LEVOTHROID) 25 MCG tablet TAKE 1 TABLET(25 MCG) BY MOUTH DAILY BEFORE BREAKFAST 30 tablet 0  . Olopatadine HCl (PAZEO) 0.7 % SOLN Place 1 drop into both eyes daily. (Patient taking differently: Place 1 drop into both eyes daily as needed (dry eyes). ) 1 Bottle 5  . raltegravir (ISENTRESS) 400 MG tablet Take 1 tablet (400 mg total) by mouth 2 (two) times daily. 60 tablet 8  . torsemide (DEMADEX) 100 MG tablet Take 1 tablet (100 mg total) by mouth 2 (two) times daily. 60 tablet 3  . triamcinolone cream (KENALOG) 0.1 % Apply 1 application topically 2 (two) times daily. (Patient taking differently: Apply 1 application topically 2 (two) times daily as needed (skin irritation). ) 30 g 0  . Turmeric 500 MG CAPS Take 500 mg by mouth daily.    . Vitamin D, Ergocalciferol, (DRISDOL) 50000 units CAPS capsule TAKE 1 CAPSULE BY MOUTH EVERY 7 DAYS 8 capsule 0  . zinc gluconate 50 MG tablet Take 50 mg by mouth daily.     . potassium chloride SA (K-DUR,KLOR-CON) 20 MEQ tablet Take 1 tablet (20 mEq total) by mouth 2 (two) times daily. 180 tablet 2  . gabapentin (NEURONTIN) 300 MG capsule Take 300 mg by mouth 3 (three) times daily.    Marland Kitchen levocetirizine (XYZAL) 5 MG tablet Take 1 tablet (5 mg total) by mouth every evening. 30 tablet 11  . oxyCODONE-acetaminophen (PERCOCET) 5-325 MG tablet Take 1 tablet by mouth every 6 (six) hours as needed for up to 15 doses for severe pain. 15 tablet 0  . spironolactone (ALDACTONE) 25 MG tablet TAKE 1 TABLET(25 MG) BY MOUTH DAILY 30 tablet 0   No facility-administered medications prior to visit.     Allergies  Allergen Reactions  . Ace Inhibitors Cough  . Lisinopril Cough    ROS Review of Systems  Constitutional: Negative.   HENT: Negative.   Eyes: Negative.   Respiratory: Negative.   Cardiovascular:  Negative.   Gastrointestinal: Negative.   Endocrine: Positive for cold intolerance.  Genitourinary: Negative.   Musculoskeletal: Negative.   Skin: Negative.   Allergic/Immunologic: Negative.   Neurological: Negative.   Hematological: Negative.   Psychiatric/Behavioral: Negative.       Objective:    Physical Exam  Constitutional: He is oriented to person, place, and time. He appears well-developed and well-nourished. No distress.  HENT:  Head: Normocephalic and atraumatic.  Eyes: Pupils are equal, round, and reactive to light. Conjunctivae and EOM are normal.  Neck: Normal range of motion.  Cardiovascular: Normal rate, regular rhythm and normal heart sounds.  Pulmonary/Chest: Effort normal and breath sounds normal. No respiratory distress.  Musculoskeletal: Normal range of motion.  Neurological: He is alert and oriented to person, place, and time.  Skin: Skin is warm and dry.  Well approximated surgical scar noted to the upper left arm. No signs of infection. No erythema or edema.   Psychiatric: He has a normal mood and affect. His behavior is normal. Judgment and thought content normal.  Nursing note and vitals reviewed.   BP (!) 136/91 (BP Location: Right Arm, Patient Position: Sitting, Cuff Size: Normal)   Pulse 69   Temp 98.4 F (36.9 C) (Oral)   Resp 14   Ht 5' 10"  (1.778 m)   Wt 218 lb (98.9 kg)   SpO2 99%   BMI 31.28 kg/m  Wt Readings from Last 3 Encounters:  09/20/18 219 lb (99.3 kg)  09/20/18 218 lb (98.9 kg)  08/27/18 217 lb  13 oz (98.8 kg)         Lab Results  Component Value Date   TSH 2.460 07/08/2018   Lab Results  Component Value Date   WBC 7.0 06/13/2018   HGB 10.9 (L) 08/27/2018   HCT 32.0 (L) 08/27/2018   MCV 90.9 06/13/2018   PLT 289 06/13/2018   Lab Results  Component Value Date   NA 142 08/27/2018   K 2.8 (L) 08/27/2018   CO2 27 07/08/2018   GLUCOSE 107 (H) 08/27/2018   BUN 49 (H) 07/08/2018   CREATININE 3.57 (H) 07/08/2018    BILITOT <0.2 07/08/2018   ALKPHOS 80 07/08/2018   AST 12 07/08/2018   ALT 15 07/08/2018   PROT 7.3 07/08/2018   ALBUMIN 4.6 07/08/2018   CALCIUM 9.9 07/08/2018   ANIONGAP 11 06/14/2018   Lab Results  Component Value Date   CHOL 127 02/17/2018   Lab Results  Component Value Date   HDL 35 (L) 02/17/2018   Lab Results  Component Value Date   LDLCALC 73 02/17/2018   Lab Results  Component Value Date   TRIG 102 02/17/2018   Lab Results  Component Value Date   CHOLHDL 3.6 02/17/2018   Lab Results  Component Value Date   HGBA1C 5.1 10/31/2016      Assessment & Plan:   Problem List Items Addressed This Visit      Genitourinary   Chronic kidney disease (CKD), stage IV (severe) (HCC) - Primary   Relevant Orders   Urinalysis Dipstick (Completed)   CBC with Differential   Comprehensive metabolic panel     Other   Hypokalemia   Relevant Medications   potassium chloride SA (K-DUR,KLOR-CON) 20 MEQ tablet   Other Relevant Orders   Comprehensive metabolic panel    Other Visit Diagnoses    Hypertension secondary to other renal disorders       Relevant Orders   Urinalysis Dipstick (Completed)   CBC with Differential   Comprehensive metabolic panel    Increased KDUR to 40 meq BID x 1 month. Will be monitored in nephrology.   Meds ordered this encounter  Medications  . potassium chloride SA (K-DUR,KLOR-CON) 20 MEQ tablet    Sig: Take 2 tablets (40 mEq total) by mouth 2 (two) times daily.    Dispense:  120 tablet    Refill:  2    Follow-up: Return in about 3 months (around 12/21/2018), or if symptoms worsen or fail to improve.   Time Spent: 25 minutes face-to-face with this patient discussing problems, treatments, and answering patient's questions.   Lanae Boast, FNP

## 2018-09-20 NOTE — Progress Notes (Signed)
POST OPERATIVE OFFICE NOTE    CC:  F/u for surgery  HPI:  This is a 52 y.o. male who is s/p left 2nd stage basilic vein transposition on 08/27/18 by Dr. Carlis Abbott.   His original 1st stage BVT was also done by Dr. Carlis Abbott on 07/02/18.  He returns today for follow up & is here with his nephew. He states he continues to have soreness around the fistula.  He denies any pain in his left hand.  He states that if he sits a certain way, his left hand will have some numbness.  He does have some numbness around his elbow also.   He is not yet on dialysis.  His nephrologist is Dr. Hollie Salk.    Allergies  Allergen Reactions  . Ace Inhibitors Cough  . Lisinopril Cough    Current Outpatient Medications  Medication Sig Dispense Refill  . abacavir (ZIAGEN) 300 MG tablet Take 1 tablet (300 mg total) by mouth daily. 30 tablet 8  . albuterol (PROAIR HFA) 108 (90 Base) MCG/ACT inhaler Inhale 2 puffs into the lungs every 4 (four) hours as needed for wheezing or shortness of breath. 1 Inhaler 3  . allopurinol (ZYLOPRIM) 100 MG tablet TAKE 1 TABLET(100 MG) BY MOUTH TWICE DAILY 60 tablet 0  . amLODipine (NORVASC) 10 MG tablet Take 1 tablet (10 mg total) by mouth daily. 30 tablet 1  . azelastine (ASTELIN) 0.1 % nasal spray Place 2 sprays into both nostrils 2 (two) times daily as needed for rhinitis. Use in each nostril as directed 30 mL 5  . carvedilol (COREG) 25 MG tablet TAKE 1 TABLET BY MOUTH TWICE DAILY WITH FOOD 60 tablet 11  . cloNIDine (CATAPRES) 0.1 MG tablet Take 1 tablet (0.1 mg total) by mouth 2 (two) times daily. 180 tablet 2  . famotidine (PEPCID) 20 MG tablet Take 20 mg by mouth daily as needed for indigestion.   9  . gabapentin (NEURONTIN) 100 MG capsule Take 1 capsule (100 mg total) by mouth 3 (three) times daily as needed (pain). 90 capsule 0  . gabapentin (NEURONTIN) 300 MG capsule Take 300 mg by mouth 3 (three) times daily.    . hydrALAZINE (APRESOLINE) 100 MG tablet TAKE 1 TABLET BY MOUTH THREE TIMES  DAILY (Patient taking differently: Take 100 mg by mouth 3 (three) times daily. ) 90 tablet 8  . ipratropium (ATROVENT) 0.06 % nasal spray 2 sprays per nostril 2 to 3 times daily as needed 15 mL 5  . lamivudine (EPIVIR) 100 MG tablet Take 1 tablet (100 mg total) by mouth daily. 90 tablet 2  . levocetirizine (XYZAL) 5 MG tablet Take 1 tablet (5 mg total) by mouth every evening. 30 tablet 11  . levothyroxine (SYNTHROID, LEVOTHROID) 25 MCG tablet TAKE 1 TABLET(25 MCG) BY MOUTH DAILY BEFORE BREAKFAST 30 tablet 0  . Olopatadine HCl (PAZEO) 0.7 % SOLN Place 1 drop into both eyes daily. (Patient taking differently: Place 1 drop into both eyes daily as needed (dry eyes). ) 1 Bottle 5  . oxyCODONE-acetaminophen (PERCOCET) 5-325 MG tablet Take 1 tablet by mouth every 6 (six) hours as needed for up to 15 doses for severe pain. 15 tablet 0  . potassium chloride SA (K-DUR,KLOR-CON) 20 MEQ tablet Take 1 tablet (20 mEq total) by mouth 2 (two) times daily. 180 tablet 2  . raltegravir (ISENTRESS) 400 MG tablet Take 1 tablet (400 mg total) by mouth 2 (two) times daily. 60 tablet 8  . spironolactone (ALDACTONE) 25 MG tablet  TAKE 1 TABLET(25 MG) BY MOUTH DAILY 30 tablet 0  . torsemide (DEMADEX) 100 MG tablet Take 1 tablet (100 mg total) by mouth 2 (two) times daily. 60 tablet 3  . triamcinolone cream (KENALOG) 0.1 % Apply 1 application topically 2 (two) times daily. (Patient taking differently: Apply 1 application topically 2 (two) times daily as needed (skin irritation). ) 30 g 0  . Turmeric 500 MG CAPS Take 500 mg by mouth daily.    . Vitamin D, Ergocalciferol, (DRISDOL) 50000 units CAPS capsule TAKE 1 CAPSULE BY MOUTH EVERY 7 DAYS 8 capsule 0  . zinc gluconate 50 MG tablet Take 50 mg by mouth daily.      No current facility-administered medications for this visit.      ROS:  See HPI  Physical Exam:  Today's Vitals   09/20/18 1316  BP: (!) 154/93  Pulse: 76  Resp: 18  Temp: 98.1 F (36.7 C)  TempSrc: Oral   SpO2: 96%  Weight: 219 lb (99.3 kg)  Height: 5' 10"  (1.778 m)  PainSc: 6    Body mass index is 31.42 kg/m.  Incision:  All incisions have healed nicely. Extremities:  Easily palpable left radial and ulnar pulse.  The fistula has an excellent thrill and is easily palpable.  Motor and sensory are in tact left hand.    Assessment/Plan:  This is a 52 y.o. male who is s/p: Left 2nd stage basilic vein transposition on 08/27/18.    -His fistula has an excellent thrill and is easily palpable.  His only complaint is that he continues to have moderate soreness around the fistula.  There is no erythema or swelling around the fistula.  This is most likely due to tunneling of the vein.  Encouraged the pt to take tylenol for pain if needed and continue to exercise his arm and this should improve.  He will contact us if this does not improve.   The numbness he described in his forearm, elbow and sometimes hand is most likely due to nerve irritation and hopefully will improve with time. -his fistula is ready for use now.  He has an appointment with Dr. Hollie Salk on 10/11/18.     Leontine Locket, PA-C [facility] [phone]  Clinic MD:  Trula Slade

## 2018-09-21 LAB — COMPREHENSIVE METABOLIC PANEL
ALT: 9 IU/L (ref 0–44)
AST: 8 IU/L (ref 0–40)
Albumin/Globulin Ratio: 1.7 (ref 1.2–2.2)
Albumin: 4.7 g/dL (ref 3.5–5.5)
Alkaline Phosphatase: 92 IU/L (ref 39–117)
BUN/Creatinine Ratio: 11 (ref 9–20)
BUN: 36 mg/dL — ABNORMAL HIGH (ref 6–24)
Bilirubin Total: <0.2 mg/dL (ref 0.0–1.2)
CO2: 22 mmol/L (ref 20–29)
Calcium: 9.9 mg/dL (ref 8.7–10.2)
Chloride: 98 mmol/L (ref 96–106)
Creatinine, Ser: 3.3 mg/dL — ABNORMAL HIGH (ref 0.76–1.27)
GFR calc Af Amer: 24 mL/min/{1.73_m2} — ABNORMAL LOW (ref >59)
GFR calc non Af Amer: 20 mL/min/{1.73_m2} — ABNORMAL LOW (ref >59)
Globulin, Total: 2.8 g/dL (ref 1.5–4.5)
Glucose: 122 mg/dL — ABNORMAL HIGH (ref 65–99)
Potassium: 3.4 mmol/L — ABNORMAL LOW (ref 3.5–5.2)
Sodium: 143 mmol/L (ref 134–144)
Total Protein: 7.5 g/dL (ref 6.0–8.5)

## 2018-09-21 LAB — CBC WITH DIFFERENTIAL/PLATELET
Basophils Absolute: 0 10*3/uL (ref 0.0–0.2)
Basos: 1 %
EOS (ABSOLUTE): 0.1 10*3/uL (ref 0.0–0.4)
Eos: 2 %
Hematocrit: 32.8 % — ABNORMAL LOW (ref 37.5–51.0)
Hemoglobin: 11.3 g/dL — ABNORMAL LOW (ref 13.0–17.7)
Immature Grans (Abs): 0 10*3/uL (ref 0.0–0.1)
Immature Granulocytes: 0 %
Lymphocytes Absolute: 2.2 10*3/uL (ref 0.7–3.1)
Lymphs: 37 %
MCH: 30.5 pg (ref 26.6–33.0)
MCHC: 34.5 g/dL (ref 31.5–35.7)
MCV: 88 fL (ref 79–97)
Monocytes Absolute: 0.8 10*3/uL (ref 0.1–0.9)
Monocytes: 13 %
Neutrophils Absolute: 2.8 10*3/uL (ref 1.4–7.0)
Neutrophils: 47 %
Platelets: 301 10*3/uL (ref 150–450)
RBC: 3.71 x10E6/uL — ABNORMAL LOW (ref 4.14–5.80)
RDW: 13.3 % (ref 12.3–15.4)
WBC: 6 10*3/uL (ref 3.4–10.8)

## 2018-09-22 ENCOUNTER — Telehealth: Payer: Self-pay

## 2018-09-22 MED ORDER — GABAPENTIN 300 MG PO CAPS: 300.0000 mg | ORAL_CAPSULE | Freq: Three times a day (TID) | ORAL | 3 refills | Status: DC

## 2018-09-22 NOTE — Telephone Encounter (Signed)
Refill for gabapentin sent into pharmacy. Thanks!

## 2018-09-30 ENCOUNTER — Other Ambulatory Visit: Payer: Self-pay

## 2018-09-30 DIAGNOSIS — E039 Hypothyroidism, unspecified: Secondary | ICD-10-CM

## 2018-09-30 DIAGNOSIS — M109 Gout, unspecified: Secondary | ICD-10-CM

## 2018-09-30 MED ORDER — ALLOPURINOL 100 MG PO TABS: ORAL_TABLET | ORAL | 0 refills | Status: DC

## 2018-09-30 MED ORDER — LEVOTHYROXINE SODIUM 25 MCG PO TABS: ORAL_TABLET | ORAL | 0 refills | Status: DC

## 2018-10-06 ENCOUNTER — Other Ambulatory Visit: Payer: Self-pay

## 2018-10-06 DIAGNOSIS — M109 Gout, unspecified: Secondary | ICD-10-CM

## 2018-10-06 DIAGNOSIS — E039 Hypothyroidism, unspecified: Secondary | ICD-10-CM

## 2018-10-06 MED ORDER — GABAPENTIN 300 MG PO CAPS: 300.0000 mg | ORAL_CAPSULE | Freq: Three times a day (TID) | ORAL | 3 refills | Status: DC

## 2018-10-06 MED ORDER — FAMOTIDINE 20 MG PO TABS: 20.0000 mg | ORAL_TABLET | Freq: Every day | ORAL | 9 refills | Status: DC | PRN

## 2018-10-06 MED ORDER — LEVOTHYROXINE SODIUM 25 MCG PO TABS: ORAL_TABLET | ORAL | 0 refills | Status: DC

## 2018-10-06 MED ORDER — ALLOPURINOL 100 MG PO TABS: ORAL_TABLET | ORAL | 0 refills | Status: DC

## 2018-10-24 ENCOUNTER — Other Ambulatory Visit: Payer: Self-pay | Admitting: Family Medicine

## 2018-11-22 ENCOUNTER — Other Ambulatory Visit (HOSPITAL_COMMUNITY)
Admission: RE | Admit: 2018-11-22 | Discharge: 2018-11-22 | Disposition: A | Discharge status: Home / Self Care | Payer: Medicaid Other | Source: Ambulatory Visit | Attending: Infectious Diseases | Admitting: Infectious Diseases

## 2018-11-22 ENCOUNTER — Other Ambulatory Visit: Payer: Medicaid Other

## 2018-11-22 DIAGNOSIS — Z79899 Other long term (current) drug therapy: Secondary | ICD-10-CM

## 2018-11-22 DIAGNOSIS — Z113 Encounter for screening for infections with a predominantly sexual mode of transmission: Secondary | ICD-10-CM | POA: Insufficient documentation

## 2018-11-22 DIAGNOSIS — B20 Human immunodeficiency virus [HIV] disease: Secondary | ICD-10-CM

## 2018-11-22 DIAGNOSIS — I129 Hypertensive chronic kidney disease with stage 1 through stage 4 chronic kidney disease, or unspecified chronic kidney disease: Secondary | ICD-10-CM

## 2018-11-22 DIAGNOSIS — N184 Chronic kidney disease, stage 4 (severe): Secondary | ICD-10-CM

## 2018-11-23 LAB — T-HELPER CELL (CD4) - (RCID CLINIC ONLY)
CD4 % Helper T Cell: 32 % — ABNORMAL LOW (ref 33–55)
CD4 T Cell Abs: 740 /uL (ref 400–2700)

## 2018-11-23 LAB — URINE CYTOLOGY ANCILLARY ONLY
Chlamydia: NEGATIVE
Neisseria Gonorrhea: NEGATIVE

## 2018-11-24 ENCOUNTER — Telehealth: Payer: Self-pay

## 2018-11-24 ENCOUNTER — Other Ambulatory Visit: Payer: Self-pay

## 2018-11-24 ENCOUNTER — Other Ambulatory Visit: Payer: Self-pay | Admitting: Infectious Diseases

## 2018-11-24 DIAGNOSIS — B20 Human immunodeficiency virus [HIV] disease: Secondary | ICD-10-CM

## 2018-11-24 LAB — CBC
HCT: 36.3 % — ABNORMAL LOW (ref 38.5–50.0)
Hemoglobin: 12.5 g/dL — ABNORMAL LOW (ref 13.2–17.1)
MCH: 29.9 pg (ref 27.0–33.0)
MCHC: 34.4 g/dL (ref 32.0–36.0)
MCV: 86.8 fL (ref 80.0–100.0)
MPV: 10.9 fL (ref 7.5–12.5)
Platelets: 283 Thousand/uL (ref 140–400)
RBC: 4.18 Million/uL — ABNORMAL LOW (ref 4.20–5.80)
RDW: 14.6 % (ref 11.0–15.0)
WBC: 6 Thousand/uL (ref 3.8–10.8)

## 2018-11-24 LAB — LIPID PANEL
CHOL/HDL RATIO: 5.5 (calc) — AB (ref <5.0)
CHOLESTEROL: 231 mg/dL — AB (ref <200)
HDL: 42 mg/dL (ref >40)
LDL Cholesterol (Calc): 157 mg/dL (calc) — ABNORMAL HIGH
Non-HDL Cholesterol (Calc): 189 mg/dL (calc) — ABNORMAL HIGH (ref <130)
TRIGLYCERIDES: 188 mg/dL — AB (ref <150)

## 2018-11-24 LAB — HIV-1 RNA QUANT-NO REFLEX-BLD
HIV 1 RNA Quant: <20 copies/mL
HIV-1 RNA Quant, Log: <1.3 Log copies/mL

## 2018-11-24 LAB — RPR: RPR: NONREACTIVE

## 2018-11-24 MED ORDER — ABACAVIR SULFATE 300 MG PO TABS: 300.0000 mg | ORAL_TABLET | Freq: Every day | ORAL | 0 refills | Status: DC

## 2018-11-24 MED ORDER — RALTEGRAVIR POTASSIUM 400 MG PO TABS: 400.0000 mg | ORAL_TABLET | Freq: Two times a day (BID) | ORAL | 0 refills | Status: DC

## 2018-11-24 NOTE — Telephone Encounter (Signed)
Patient returning my call states that he has medicaid now. Will inform Juliann Pulse, Development worker, community.  [place]

## 2018-11-24 NOTE — Telephone Encounter (Signed)
Called patient to schedule an appointment to renew ADAP. Was unable to talk to patient at this moment; left voicemail requesting patient call when available to schedule appointment with financial counselor.  [place]

## 2018-12-06 ENCOUNTER — Ambulatory Visit: Payer: Medicaid Other | Admitting: Infectious Diseases

## 2018-12-09 ENCOUNTER — Other Ambulatory Visit: Payer: Self-pay | Admitting: Family Medicine

## 2018-12-09 ENCOUNTER — Encounter: Payer: Self-pay | Admitting: Cardiovascular Disease

## 2018-12-09 DIAGNOSIS — E876 Hypokalemia: Secondary | ICD-10-CM

## 2018-12-10 ENCOUNTER — Ambulatory Visit: Payer: Medicaid Other | Admitting: Infectious Diseases

## 2018-12-17 ENCOUNTER — Other Ambulatory Visit: Payer: Self-pay | Admitting: Family Medicine

## 2018-12-20 ENCOUNTER — Other Ambulatory Visit: Payer: Self-pay | Admitting: *Deleted

## 2018-12-20 ENCOUNTER — Other Ambulatory Visit: Payer: Self-pay | Admitting: Infectious Diseases

## 2018-12-20 DIAGNOSIS — B20 Human immunodeficiency virus [HIV] disease: Secondary | ICD-10-CM

## 2018-12-20 MED ORDER — LAMIVUDINE 100 MG PO TABS: ORAL_TABLET | ORAL | 0 refills | Status: DC

## 2018-12-20 MED ORDER — RALTEGRAVIR POTASSIUM 400 MG PO TABS: 400.0000 mg | ORAL_TABLET | Freq: Two times a day (BID) | ORAL | 0 refills | Status: DC

## 2018-12-20 MED ORDER — ABACAVIR SULFATE 300 MG PO TABS: 300.0000 mg | ORAL_TABLET | Freq: Every day | ORAL | 0 refills | Status: DC

## 2018-12-22 ENCOUNTER — Encounter: Payer: Self-pay | Admitting: Family Medicine

## 2018-12-22 ENCOUNTER — Other Ambulatory Visit: Payer: Self-pay | Admitting: Family Medicine

## 2018-12-22 ENCOUNTER — Ambulatory Visit (INDEPENDENT_AMBULATORY_CARE_PROVIDER_SITE_OTHER): Payer: Medicaid Other | Admitting: Family Medicine

## 2018-12-22 VITALS — BP 135/87 | HR 79 | Temp 98.4°F | Resp 16 | Ht 70.0 in | Wt 215.0 lb

## 2018-12-22 DIAGNOSIS — M109 Gout, unspecified: Secondary | ICD-10-CM

## 2018-12-22 DIAGNOSIS — I151 Hypertension secondary to other renal disorders: Secondary | ICD-10-CM

## 2018-12-22 DIAGNOSIS — E039 Hypothyroidism, unspecified: Secondary | ICD-10-CM

## 2018-12-22 DIAGNOSIS — N2889 Other specified disorders of kidney and ureter: Secondary | ICD-10-CM | POA: Diagnosis not present

## 2018-12-22 DIAGNOSIS — H938X1 Other specified disorders of right ear: Secondary | ICD-10-CM

## 2018-12-22 LAB — POCT URINALYSIS DIPSTICK
Bilirubin, UA: NEGATIVE
Blood, UA: NEGATIVE
Glucose, UA: NEGATIVE
Ketones, UA: NEGATIVE
Leukocytes, UA: NEGATIVE
Nitrite, UA: NEGATIVE
Protein, UA: POSITIVE — AB
Spec Grav, UA: 1.015 (ref 1.010–1.025)
Urobilinogen, UA: 0.2 E.U./dL
pH, UA: 5 (ref 5.0–8.0)

## 2018-12-22 NOTE — Progress Notes (Signed)
Patient [facility]   Progress Note: General Provider: Lanae Boast, FNP  SUBJECTIVE:   Albert Eaton is a 53 y.o. male who  has a past medical history of Anemia, Chronic diastolic CHF (congestive heart failure) ([place]) (01/06/2017), CKD (chronic kidney disease), Gout, Hepatitis, History of nuclear stress test, HIV (human immunodeficiency virus infection) ([place]), HLD (hyperlipidemia), Hypertension, Hypertensive heart disease with CHF (congestive heart failure) ([place]) (10/09/2016), OSA (obstructive sleep apnea), Post-operative nausea and vomiting, Sleep apnea, Wears glasses, and Wears partial dentures.. Patient presents today for Hypertension; Hypothyroidism; and Ear Problem (patinet states he has "spasms" in right ear ) Patient states that he is having "spasms" in the right ear that occurs several times during the day. He states that this has been an ongoing problem for the past year or more. He states that frequency has increased in the past few days.   Review of Systems  Constitutional: Negative.   HENT: Positive for ear pain. Negative for hearing loss.   Eyes: Negative.   Respiratory: Negative.   Cardiovascular: Negative.   Gastrointestinal: Negative.   Genitourinary: Negative.   Musculoskeletal: Negative.   Skin: Negative.   Neurological: Negative.   Psychiatric/Behavioral: Negative.      OBJECTIVE: BP 135/87 (BP Location: Right Arm, Patient Position: Sitting, Cuff Size: Normal)   Pulse 79   Temp 98.4 F (36.9 C) (Oral)   Resp 16   Ht 5' 10"  (1.778 m)   Wt 215 lb (97.5 kg)   SpO2 98%   BMI 30.85 kg/m   Wt Readings from Last 3 Encounters:  12/22/18 215 lb (97.5 kg)  09/20/18 219 lb (99.3 kg)  09/20/18 218 lb (98.9 kg)     Physical Exam Vitals signs and nursing note reviewed.  Constitutional:      General: He is not in acute distress.    Appearance: He is well-developed.  HENT:     Head: Normocephalic and atraumatic.      Right Ear: Hearing, tympanic membrane, ear canal and external ear normal.     Left Ear: Hearing, tympanic membrane, ear canal and external ear normal.     Mouth/Throat:     Lips: Pink.     Mouth: Mucous membranes are moist.     Pharynx: Oropharynx is clear.  Eyes:     Conjunctiva/sclera: Conjunctivae normal.     Pupils: Pupils are equal, round, and reactive to light.  Neck:     Musculoskeletal: Normal range of motion.  Cardiovascular:     Rate and Rhythm: Normal rate and regular rhythm.     Heart sounds: Normal heart sounds.  Pulmonary:     Effort: Pulmonary effort is normal. No respiratory distress.     Breath sounds: Normal breath sounds.  Abdominal:     General: Bowel sounds are normal. There is no distension.     Palpations: Abdomen is soft.  Musculoskeletal: Normal range of motion.  Skin:    General: Skin is warm and dry.  Neurological:     Mental Status: He is alert and oriented to person, place, and time.  Psychiatric:        Behavior: Behavior normal.        Thought Content: Thought content normal.     ASSESSMENT/PLAN: 1. Hypertension secondary to other renal disorders - Urinalysis Dipstick  2. Abnormal sensation in right ear - Ambulatory referral to ENT  No medication changes warranted at the present time. Continue with current medical regimen to include specialists  visits.   Return in about 6 months (around 06/22/2019) for Follow up.    The patient was given clear instructions to go to ER or return to medical center if symptoms do not improve, worsen or new problems develop. The patient verbalized understanding and agreed with plan of care.   Ms. Doug Sou. Nathaneil Canary, FNP-BC [facility] [address] [place], [place] [address] [phone]

## 2018-12-22 NOTE — Patient Instructions (Addendum)
It was good seeing you again. I am referring you to the Ear, Nose and Throat (ENT) specialist for further evaluation.     Ear Barotrauma, Adult Ear barotrauma is irritation and swelling (inflammation) around the eardrum (middle ear). This happens when a change in air pressure causes a blockage in a tube in the ear (eustachian tube). A pressure change may happen when:  Flying in an airplane.  Coming to the surface too quickly when scuba diving.  Going to a high place (high altitude) quickly.  Being too close to an explosion or blast. Follow these instructions at home: General instructions  Take over-the-counter and prescription medicines only as told by your doctor.  "Pop" your ears (equalize pressure) as told by your doctor. Ways to pop your ears include: ? Yawning. ? Chewing gum. ? Swallowing. ? Holding your nose closed and gently blowing.  Do not put anything into your ears to clean or unplug them. Ear drops will not help.  Do not do the following until your doctor says it is okay: ? Travel to places that are high above sea level, such as mountains. ? Fly. ? Scuba dive.  Keep all follow-up visits as told by your doctor. This is important. How is this prevented?  Avoid activities that may bring pressure changes when you have symptoms of a cold or stuffiness (congestion).  If you have stuffiness and will be flying, take medicine to relieve stuffiness (decongestant) 30-60 minutes before flying.  When flying, during takeoff and landing: ? Chew gum. ? Swallow hard and often.  Hold your nose closed and gently blow to pop your ears.  Yawn during air pressure changes.  If scuba diving, dive feet first and pop your ears often as you go deeper. Contact a doctor if:  Your symptoms get worse.  Your symptoms do not get better.  You feel dizzy (vertigo).  You have hearing loss.  You have a fever. Get help right away if:  You have very bad ear pain.  You have a very  bad headache.  You have very bad dizziness.  You have blood or pus coming from your ear.  You have balance problems.  You cannot move or feel part of your face. Summary  Ear barotrauma is irritation and swelling (inflammation) around the eardrum (middle ear).  This condition may be treated with medicines or by "popping" your ears (equalizing pressure).  You can take steps to help prevent ear barotrauma. This information is not intended to replace advice given to you by your health care provider. Make sure you discuss any questions you have with your health care provider. Document Released: 04/02/2010 Document Revised: 01/23/2017 Document Reviewed: 01/23/2017 Elsevier Interactive Patient Education  2019 [employer].

## 2018-12-27 ENCOUNTER — Ambulatory Visit (INDEPENDENT_AMBULATORY_CARE_PROVIDER_SITE_OTHER): Payer: Medicaid Other | Admitting: Cardiovascular Disease

## 2018-12-27 ENCOUNTER — Encounter: Payer: Self-pay | Admitting: Cardiovascular Disease

## 2018-12-27 VITALS — BP 118/78 | HR 71 | Ht 70.0 in | Wt 217.4 lb

## 2018-12-27 DIAGNOSIS — I5032 Chronic diastolic (congestive) heart failure: Secondary | ICD-10-CM

## 2018-12-27 DIAGNOSIS — I110 Hypertensive heart disease with heart failure: Secondary | ICD-10-CM | POA: Diagnosis not present

## 2018-12-27 MED ORDER — FAMOTIDINE 20 MG PO TABS: 20.0000 mg | ORAL_TABLET | Freq: Every day | ORAL | 11 refills | Status: DC | PRN

## 2018-12-27 MED ORDER — FAMOTIDINE 20 MG PO TABS: 20.0000 mg | ORAL_TABLET | Freq: Two times a day (BID) | ORAL | 11 refills | Status: DC

## 2018-12-27 NOTE — Patient Instructions (Signed)
Medication Instructions:  Your provider recommends that you continue on your current medications as directed. Please refer to the Current Medication list given to you today.    Labwork: None  Testing/Procedures: None  Follow-Up: Your provider wants you to follow-up in: 1 year with Dr. Cooper. You will receive a reminder letter in the mail two months in advance. If you don't receive a letter, please call our office to schedule the follow-up appointment.    

## 2018-12-27 NOTE — Progress Notes (Signed)
Cardiology Office Note:    Date:  12/27/2018   ID:  Albert Eaton, DOB 1966/04/25, MRN [id number]  PCP:  Lanae Boast, FNP  Cardiologist:  [name], MD  Electrophysiologist:  None   Referring MD: Lanae Boast, FNP   Chief Complaint  Patient presents with  . Fatigue    History of Present Illness:    Albert Eaton is a 53 y.o. male with a hx of stage IV chronic kidney disease, chronic diastolic heart failure, mitral regurgitation, HIV, and obstructive sleep apnea.  He initially presented in 2017 with hypertensive urgency, acute diastolic heart failure, and elevated troponin.  He was managed medically.  The patient is here alone today.  He is doing fine from a cardiac perspective.  He does complain of generalized fatigue.  He denies edema, orthopnea, or PND.  He has an occasional pain in the right upper chest unrelated to physical exertion.  He has no central or left-sided chest pain.  He is compliant with his medications.  States that he is regularly with Dr. Hollie Salk and reports that his renal function has been stable.  Past Medical History:  Diagnosis Date  . Anemia   . Chronic diastolic CHF (congestive heart failure) ([place]) 01/06/2017   Echo 7/16 [facility] in [place], [name]) Mild AI, mild LAE, mild concentric LVH, EF 55, normal wall motion, mild to moderate MR, mild PI, RVSP 55 // Echo 10/09/16 ([facility]):  Moderate LVH, grade 2 diastolic dysfunction, mild MR, moderate LAE   . CKD (chronic kidney disease)    per pt, waiting on a kidney transplant in near future.  . Gout   . Hepatitis    Hep B  . History of nuclear stress test    a. Nuc study 7/16: no scar or ischemia, EF 42 // b. Nuc study 12/17: EF 48, ?small apical ischemia, Low Risk  . HIV (human immunodeficiency virus infection) ([place])   . HLD (hyperlipidemia)   . Hypertension   . Hypertensive heart disease with CHF (congestive heart failure) ([place]) 10/09/2016  . OSA (obstructive sleep apnea)   . Post-operative  nausea and vomiting    1 time as a child  . Sleep apnea    uses c-pap  . Wears glasses   . Wears partial dentures     Past Surgical History:  Procedure Laterality Date  . AV FISTULA PLACEMENT Left 07/02/2018   Procedure: Creation of Left arm BRACHIOBASILIC ARTERIOVENOUS  FISTULA;  Surgeon: Marty Heck, MD;  Location: [place];  Service: Vascular;  Laterality: Left;  . BASCILIC VEIN TRANSPOSITION Left 08/27/2018   Procedure: Left arm BRACHIOBASILIC VEIN TRANSPOSITION SECOND STAGE;  Surgeon: Marty Heck, MD;  Location: [place];  Service: Vascular;  Laterality: Left;  . CHOLECYSTECTOMY    . COLONOSCOPY W/ BIOPSIES AND POLYPECTOMY    . GIVENS CAPSULE STUDY N/A 03/01/2018   Procedure: GIVENS CAPSULE STUDY;  Surgeon: Jerene Bears, MD;  Location: [place];  Service: Gastroenterology;  Laterality: N/A;  . HERNIA REPAIR     As baby  . MULTIPLE TOOTH EXTRACTIONS    . NOSE SURGERY    . RENAL BIOPSY      Current Medications: Current Meds  Medication Sig  . abacavir (ZIAGEN) 300 MG tablet Take 1 tablet (300 mg total) by mouth daily.  Marland Kitchen albuterol (PROAIR HFA) 108 (90 Base) MCG/ACT inhaler Inhale 2 puffs into the lungs every 4 (four) hours as needed for wheezing or shortness of breath.  . allopurinol (ZYLOPRIM)  100 MG tablet TAKE 1 TABLET BY MOUTH TWICE DAILY  . amLODipine (NORVASC) 10 MG tablet Take 1 tablet (10 mg total) by mouth daily.  Marland Kitchen azelastine (ASTELIN) 0.1 % nasal spray Place 2 sprays into both nostrils 2 (two) times daily as needed for rhinitis. Use in each nostril as directed  . carvedilol (COREG) 25 MG tablet TAKE 1 TABLET BY MOUTH TWICE DAILY WITH FOOD  . cloNIDine (CATAPRES) 0.1 MG tablet Take 1 tablet (0.1 mg total) by mouth 2 (two) times daily.  . famotidine (PEPCID) 20 MG tablet Take 1 tablet (20 mg total) by mouth 2 (two) times daily.  Marland Kitchen gabapentin (NEURONTIN) 300 MG capsule Take 1 capsule (300 mg total) by mouth 3 (three) times daily.  . hydrALAZINE (APRESOLINE)  100 MG tablet TAKE 1 TABLET BY MOUTH THREE TIMES DAILY  . ipratropium (ATROVENT) 0.06 % nasal spray 2 sprays per nostril 2 to 3 times daily as needed  . lamivudine (EPIVIR) 100 MG tablet TAKE 1 TABLET(100 MG) BY MOUTH DAILY  . levothyroxine (SYNTHROID, LEVOTHROID) 25 MCG tablet TAKE 1 TABLET BY MOUTH DAILY BEFORE BREAKFAST  . Olopatadine HCl (PAZEO) 0.7 % SOLN Place 1 drop into both eyes daily.  Marland Kitchen oxyCODONE-acetaminophen (PERCOCET) 5-325 MG tablet Take 1 tablet by mouth every 6 (six) hours as needed for up to 15 doses for severe pain.  . potassium chloride SA (K-DUR,KLOR-CON) 20 MEQ tablet TAKE 2 TABLETS(40 MEQ) BY MOUTH TWICE DAILY  . raltegravir (ISENTRESS) 400 MG tablet Take 1 tablet (400 mg total) by mouth 2 (two) times daily.  Marland Kitchen spironolactone (ALDACTONE) 25 MG tablet TAKE 1 TABLET(25 MG) BY MOUTH DAILY  . torsemide (DEMADEX) 100 MG tablet Take 1 tablet (100 mg total) by mouth 2 (two) times daily.  Marland Kitchen triamcinolone cream (KENALOG) 0.1 % Apply 1 application topically 2 (two) times daily.  . Turmeric 500 MG CAPS Take 500 mg by mouth daily.  . Vitamin D, Ergocalciferol, (DRISDOL) 1.25 MG (50000 UT) CAPS capsule TAKE 1 CAPSULE BY MOUTH EVERY 7 DAYS  . zinc gluconate 50 MG tablet Take 50 mg by mouth daily.   . [DISCONTINUED] famotidine (PEPCID) 20 MG tablet Take 1 tablet (20 mg total) by mouth daily as needed for indigestion.  . [DISCONTINUED] famotidine (PEPCID) 20 MG tablet Take 1 tablet (20 mg total) by mouth daily as needed for indigestion.     Allergies:   Ace inhibitors and Lisinopril   Social History   Socioeconomic History  . Marital status: Single    Spouse name: Not on file  . Number of children: Not on file  . Years of education: Not on file  . Highest education level: Not on file  Occupational History  . Not on file  Social Needs  . Financial resource strain: Not on file  . Food insecurity:    Worry: Not on file    Inability: Not on file  . Transportation needs:     Medical: Not on file    Non-medical: Not on file  Tobacco Use  . Smoking status: Former Smoker    Types: Cigarettes    Last attempt to quit: 2000    Years since quitting: 20.1  . Smokeless tobacco: Never Used  Substance and Sexual Activity  . Alcohol use: Not Currently    Alcohol/week: 2.0 standard drinks    Types: 2 Shots of liquor per week  . Drug use: No  . Sexual activity: Never    Partners: Male    Birth control/protection:  Condom    Comment: declined condoms  Lifestyle  . Physical activity:    Days per week: Not on file    Minutes per session: Not on file  . Stress: Not on file  Relationships  . Social connections:    Talks on phone: Not on file    Gets together: Not on file    Attends religious service: Not on file    Active member of club or organization: Not on file    Attends meetings of clubs or organizations: Not on file    Relationship status: Not on file  Other Topics Concern  . Not on file  Social History Narrative  . Not on file     Family History: The patient's family history includes Heart attack (age of onset: 53) in his maternal grandmother; Heart failure in his mother; Hypertension in his brother, sister, and sister; Multiple sclerosis in his sister; Scoliosis in an other family member. There is no history of Allergic rhinitis, Angioedema, Asthma, Eczema, Immunodeficiency, or Urticaria.  ROS:   Please see the history of present illness.    All other systems reviewed and are negative.  EKGs/Labs/Other Studies Reviewed:    The following studies were reviewed today: 2D echocardiogram 11/19/2017: Study Conclusions  - Left ventricle: The cavity size was normal. There was severe   focal basal and moderate concentric hypertrophy. Systolic   function was normal. The estimated ejection fraction was in the   range of 60% to 65%. Wall motion was normal; there were no   regional wall motion abnormalities. Features are consistent with   a pseudonormal  left ventricular filling pattern, with concomitant   abnormal relaxation and increased filling pressure (grade 2   diastolic dysfunction). - Aortic valve: There was trivial regurgitation. - Aorta: Aortic root dimension: 39 mm (ED). Ascending aortic   diameter: 40 mm (S). - Aortic root: The aortic root was mildly dilated. - Ascending aorta: The ascending aorta was mildly dilated. - Mitral valve: There was mild to moderate regurgitation directed   eccentrically and posteriorly. - Left atrium: The atrium was severely dilated. - Right ventricle: The cavity size was moderately dilated. Wall   thickness was normal. - Pulmonary arteries: PA peak pressure: 35 mm Hg (S).  EKG:  EKG is ordered today.    Recent Labs: 06/13/2018: B Natriuretic Peptide 42.8; Magnesium 2.3 07/08/2018: TSH 2.460 09/20/2018: ALT 9; BUN 36; Creatinine, Ser 3.30; Potassium 3.4; Sodium 143 11/22/2018: Hemoglobin 12.5; Platelets 283  Recent Lipid Panel    Component Value Date/Time   CHOL 231 (H) 11/22/2018 0955   CHOL 180 12/09/2017 0936   TRIG 188 (H) 11/22/2018 0955   HDL 42 11/22/2018 0955   HDL 34 (L) 12/09/2017 0936   CHOLHDL 5.5 (H) 11/22/2018 0955   VLDL 29 05/25/2017 0953   LDLCALC 157 (H) 11/22/2018 0955    Physical Exam:    VS:  BP 118/78   Pulse 71   Ht 5' 10"  (1.778 m)   Wt 217 lb 6.4 oz (98.6 kg)   SpO2 95%   BMI 31.19 kg/m     Wt Readings from Last 3 Encounters:  12/27/18 217 lb 6.4 oz (98.6 kg)  12/22/18 215 lb (97.5 kg)  09/20/18 219 lb (99.3 kg)     GEN: Well nourished, well developed in no acute distress HEENT: Normal NECK: No JVD; No carotid bruits LYMPHATICS: No lymphadenopathy CARDIAC: RRR, 2/6 SEM at the RUSB, early peaking. No diastolic murmur. RESPIRATORY:  Clear to auscultation  without rales, wheezing or rhonchi  ABDOMEN: Soft, non-tender, non-distended MUSCULOSKELETAL:  No edema; No deformity  SKIN: Warm and dry NEUROLOGIC:  Alert and oriented x 3 PSYCHIATRIC:  Normal  affect   ASSESSMENT:    1. Chronic diastolic CHF (congestive heart failure) ([place])   2. Hypertensive heart disease with chronic diastolic congestive heart failure ([place])    PLAN:    In order of problems listed above:  1. Patient appears euvolemic on exam.  We discussed the importance of continued medication adherence and blood pressure control.  We discussed sodium restriction.  We discussed the interplay between his hypertensive heart disease and advanced chronic kidney disease placing him at risk for volume overload/pulmonary edema. 2. Blood pressure is under ideal control multidrug therapy with amlodipine, carvedilol, clonidine, hydralazine, and his torsemide.   Medication Adjustments/Labs and Tests Ordered: Current medicines are reviewed at length with the patient today.  Concerns regarding medicines are outlined above.  No orders of the defined types were placed in this encounter.  Meds ordered this encounter  Medications  . DISCONTD: famotidine (PEPCID) 20 MG tablet    Sig: Take 1 tablet (20 mg total) by mouth daily as needed for indigestion.    Dispense:  30 tablet    Refill:  11  . famotidine (PEPCID) 20 MG tablet    Sig: Take 1 tablet (20 mg total) by mouth 2 (two) times daily.    Dispense:  60 tablet    Refill:  11    Updated/corrected order for BID. Thanks!    Patient Instructions  Medication Instructions:  Your provider recommends that you continue on your current medications as directed. Please refer to the Current Medication list given to you today.    Labwork: None  Testing/Procedures: None  Follow-Up: Your provider wants you to follow-up in: 1 year with Dr. [name]. You will receive a reminder letter in the mail two months in advance. If you don't receive a letter, please call our office to schedule the follow-up appointment.      Signed, [name], MD  12/27/2018 9:07 AM    [place]

## 2018-12-29 ENCOUNTER — Encounter: Payer: Self-pay | Admitting: Infectious Diseases

## 2018-12-29 ENCOUNTER — Ambulatory Visit (INDEPENDENT_AMBULATORY_CARE_PROVIDER_SITE_OTHER): Payer: Medicaid Other | Admitting: Infectious Diseases

## 2018-12-29 VITALS — BP 152/90 | HR 69 | Temp 98.0°F | Wt 215.0 lb

## 2018-12-29 DIAGNOSIS — Z79899 Other long term (current) drug therapy: Secondary | ICD-10-CM | POA: Diagnosis not present

## 2018-12-29 DIAGNOSIS — I110 Hypertensive heart disease with heart failure: Secondary | ICD-10-CM

## 2018-12-29 DIAGNOSIS — Z23 Encounter for immunization: Secondary | ICD-10-CM | POA: Diagnosis not present

## 2018-12-29 DIAGNOSIS — B20 Human immunodeficiency virus [HIV] disease: Secondary | ICD-10-CM

## 2018-12-29 DIAGNOSIS — N184 Chronic kidney disease, stage 4 (severe): Secondary | ICD-10-CM

## 2018-12-29 DIAGNOSIS — Z113 Encounter for screening for infections with a predominantly sexual mode of transmission: Secondary | ICD-10-CM

## 2018-12-29 NOTE — Progress Notes (Signed)
   Subjective:    Patient ID: Albert Eaton, male    DOB: 1965/11/29, 53 y.o.   MRN: [id number]  HPI 53y.o.Mwith a history of CHF (nonischemic non-dilated CMwith NYHA 2/C HF preserved EF 55% with moderate concentric hypertrophy and a grade 2 diastolic dysfunction. Moderate mitral valve insufficiency), CKD stage III-IV, HTN, HLD, OSA, neuropathy,and HIV (dx 1991). He had kidney bx 9-18 showing focal nephrosclerosis, interstitial fibrosis.  Taking Isentress, Epivir, abacavir. No problem taking ART.    HIV 1 RNA Quant (copies/mL)  Date Value  11/22/2018 <20 NOT DETECTED  02/17/2018 <20 NOT DETECTED  08/17/2017 <20 NOT DETECTED   CD4 T Cell Abs (/uL)  Date Value  11/22/2018 740  02/17/2018 670  08/17/2017 610   Has had f/u with renal- "I'm doing ok". His kidney function has improved 22-->25. Is on list for txp.  Had CV f/u this week. Wt is down 4#. Has been asx. Is on water restriction. Constipated.   Review of Systems  Constitutional: Negative for appetite change and unexpected weight change.  Respiratory: Negative for cough and shortness of breath.   Cardiovascular: Negative for chest pain.  Gastrointestinal: Positive for constipation. Negative for diarrhea.  Genitourinary: Negative for difficulty urinating and dysuria.  Psychiatric/Behavioral: Negative for sleep disturbance.       Objective:   Physical Exam Constitutional:      Appearance: Normal appearance.  HENT:     Mouth/Throat:     Mouth: Mucous membranes are moist.     Pharynx: No oropharyngeal exudate.  Eyes:     Extraocular Movements: Extraocular movements intact.     Pupils: Pupils are equal, round, and reactive to light.  Neck:     Musculoskeletal: Normal range of motion and neck supple. No muscular tenderness.  Cardiovascular:     Rate and Rhythm: Normal rate and regular rhythm.  Pulmonary:     Effort: Pulmonary effort is normal.  Abdominal:     General: Bowel sounds are normal. There is no  distension.     Palpations: Abdomen is soft.     Tenderness: There is no abdominal tenderness.  Musculoskeletal:     Right lower leg: No edema.     Left lower leg: No edema.  Neurological:     General: No focal deficit present.     Mental Status: He is alert.  Psychiatric:        Mood and Affect: Mood normal.       Assessment & Plan:

## 2018-12-29 NOTE — Assessment & Plan Note (Signed)
He is doing well.  Asx.  Wt fairly stable +/- 4#.  Greatly appreciate Dr York Cerise care of pt

## 2018-12-29 NOTE — Addendum Note (Signed)
Addended by: Lenore Cordia on: 12/29/2018 11:21 AM   Modules accepted: Orders

## 2018-12-29 NOTE — Assessment & Plan Note (Signed)
He is doing very well on renally dosed ART.  PCV 13 today, has gotten other vax.  Has had colon (2019) Offered/refused condoms.  rtc in 9 months.

## 2018-12-29 NOTE — Assessment & Plan Note (Signed)
He appears to be stable by his report.  His CrCl has improved from 11-13% to 24% Greatly appreciate Dr Bishop Dublin excellent care.

## 2018-12-31 NOTE — Telephone Encounter (Signed)
Patient voiced interest in an injectable study for HIV.  In injectable study were conducting is for nonadherent patients which would not be this patient.  However there is a possibility that we may be able to get a study this fall that involves administration of 2 long-acting antivirals given every 2 months.  He would very likely qualify for that study but we do not anticipated opening until August if we are selected as a site.  Study he saw advertised was not 1 that he is eligible for but we may have one for him in the fall

## 2019-01-15 ENCOUNTER — Other Ambulatory Visit: Payer: Self-pay | Admitting: Infectious Diseases

## 2019-01-15 DIAGNOSIS — B20 Human immunodeficiency virus [HIV] disease: Secondary | ICD-10-CM

## 2019-01-17 ENCOUNTER — Other Ambulatory Visit: Payer: Self-pay

## 2019-01-17 DIAGNOSIS — B20 Human immunodeficiency virus [HIV] disease: Secondary | ICD-10-CM

## 2019-01-17 MED ORDER — ABACAVIR SULFATE 300 MG PO TABS: 300.0000 mg | ORAL_TABLET | Freq: Every day | ORAL | 3 refills | Status: DC

## 2019-01-17 MED ORDER — RALTEGRAVIR POTASSIUM 400 MG PO TABS: 400.0000 mg | ORAL_TABLET | Freq: Two times a day (BID) | ORAL | 3 refills | Status: DC

## 2019-01-17 MED ORDER — LAMIVUDINE 100 MG PO TABS: ORAL_TABLET | ORAL | 3 refills | Status: DC

## 2019-01-19 ENCOUNTER — Other Ambulatory Visit: Payer: Self-pay | Admitting: Family Medicine

## 2019-01-19 ENCOUNTER — Other Ambulatory Visit: Payer: Self-pay | Admitting: Cardiovascular Disease

## 2019-01-19 DIAGNOSIS — E039 Hypothyroidism, unspecified: Secondary | ICD-10-CM

## 2019-01-19 DIAGNOSIS — M109 Gout, unspecified: Secondary | ICD-10-CM

## 2019-02-03 ENCOUNTER — Other Ambulatory Visit: Payer: Self-pay | Admitting: Family Medicine

## 2019-02-15 ENCOUNTER — Other Ambulatory Visit: Payer: Self-pay | Admitting: Family Medicine

## 2019-02-15 DIAGNOSIS — E039 Hypothyroidism, unspecified: Secondary | ICD-10-CM

## 2019-02-15 DIAGNOSIS — M109 Gout, unspecified: Secondary | ICD-10-CM

## 2019-02-18 DIAGNOSIS — H9201 Otalgia, right ear: Secondary | ICD-10-CM | POA: Insufficient documentation

## 2019-02-28 ENCOUNTER — Ambulatory Visit: Payer: Medicaid Other | Admitting: Allergy and Immunology

## 2019-03-01 ENCOUNTER — Ambulatory Visit (INDEPENDENT_AMBULATORY_CARE_PROVIDER_SITE_OTHER): Payer: Medicaid Other | Admitting: Allergy and Immunology

## 2019-03-01 ENCOUNTER — Encounter: Payer: Self-pay | Admitting: Allergy and Immunology

## 2019-03-01 ENCOUNTER — Other Ambulatory Visit: Payer: Self-pay

## 2019-03-01 DIAGNOSIS — J4530 Mild persistent asthma, uncomplicated: Secondary | ICD-10-CM

## 2019-03-01 DIAGNOSIS — H1013 Acute atopic conjunctivitis, bilateral: Secondary | ICD-10-CM

## 2019-03-01 DIAGNOSIS — J3089 Other allergic rhinitis: Secondary | ICD-10-CM | POA: Diagnosis not present

## 2019-03-01 MED ORDER — FLUTICASONE PROPIONATE HFA 110 MCG/ACT IN AERO: 2.0000 | INHALATION_SPRAY | Freq: Two times a day (BID) | RESPIRATORY_TRACT | 5 refills | Status: DC

## 2019-03-01 MED ORDER — SPACER/AERO-HOLDING CHAMBERS DEVI: 1.0000 | Freq: Two times a day (BID) | 1 refills | Status: AC

## 2019-03-01 NOTE — Assessment & Plan Note (Signed)
   Treatment plan as outlined above for allergic rhinitis.  Continue olopatadine eyedrops, 1 drop per eye daily when needed.  Eye lubricant drops (i.e., Natural Tears) are recommended as needed.

## 2019-03-01 NOTE — Assessment & Plan Note (Signed)
Currently with suboptimal control.  We will step up therapy at this time.  A prescription has been provided for Flovent (fluticasone) 110 g, 2 inhalations twice a day. To maximize pulmonary deposition, a spacer has been provided along with instructions for its proper administration with an HFA inhaler.  Continue albuterol HFA, 1 to 2 inhalations every 4-6 hours if needed.  The patient has been asked to contact me if his symptoms persist or progress. Otherwise, he may return for follow up in 2 months.

## 2019-03-01 NOTE — Assessment & Plan Note (Signed)
   Continue appropriate allergen avoidance measures.  A prescription has been provided for fluticasone nasal spray, one spray per nostril 1-2 times daily as needed.   Continue azelastine nasal spray, 1 to 2 sprays per nostril twice daily as needed.  Nasal saline lavage (NeilMed) has been recommended as needed and prior to medicated nasal sprays along with instructions for proper administration.

## 2019-03-01 NOTE — Progress Notes (Signed)
Follow-up Telemedicine Note  RE: Albert Eaton MRN: [id number] DOB: Jan 27, 1966 Date of Telemedicine Visit: 03/01/2019  Primary care provider: Lanae Boast, [place] Referring provider: Lanae Boast, [place]  Telemedicine Follow Up Visit via Telephone: I connected with Albert Eaton for a follow up on 03/01/19 by telephone and verified that I am speaking with the correct person using two identifiers.   The limitations, risks, security and privacy concerns of performing an evaluation and management service by telemedicine, the availability of in person appointments, and that there may be a patient responsible charge related to this service were discussed. The patient expressed understanding and agreed to proceed.  Patient is at home.  Provider is at the office.  Visit start time: 10:17 AM Visit end time: 10:40 AM Insurance consent/check in by: [facility] consent and medical assistant/nurse: Caryl Pina  History of present illness: Albert Eaton is a 53 y.o. male with a complex medical history and allergic rhinoconjunctivitis and intermittent asthma presenting today via telemedicine for a sick visit.  He was last seen in this clinic in November 2019.  He reports that over the past 2 months he has been experiencing more frequent coughing and shortness of breath.  He takes 1 or 2 inhalations of albuterol with symptom relief.  He is requiring albuterol rescue at least 2 or 3 times per week on average.  He does not experience nocturnal awakenings due to lower respiratory symptoms.  The lower respiratory symptoms are triggered by aero allergen exposure and prolonged talking.  He reports that he has not been exercising or going outdoors very much. Can also complains that his nasal and ocular allergy symptoms have been flaring over the past few weeks despite using azelastine nasal spray once daily.  Assessment and plan: Mild persistent asthma Currently with suboptimal control.  We will step up therapy  at this time.  A prescription has been provided for Flovent (fluticasone) 110 g,  2 inhalations twice a day. To maximize pulmonary deposition, a spacer has been provided along with instructions for its proper administration with an HFA inhaler.  Continue albuterol HFA, 1 to 2 inhalations every 4-6 hours if needed.  The patient has been asked to contact me if his symptoms persist or progress. Otherwise, he may return for follow up in 2 months.  Perennial allergic rhinitis with a predominant nonallergic component  Continue appropriate allergen avoidance measures.  A prescription has been provided for fluticasone nasal spray, one spray per nostril 1-2 times daily as needed.   Continue azelastine nasal spray, 1 to 2 sprays per nostril twice daily as needed.  Nasal saline lavage (NeilMed) has been recommended as needed and prior to medicated nasal sprays along with instructions for proper administration.  Allergic conjunctivitis  Treatment plan as outlined above for allergic rhinitis.  Continue olopatadine eyedrops, 1 drop per eye daily when needed.  Eye lubricant drops (i.e., Natural Tears) are recommended as needed.   Meds ordered this encounter  Medications  . fluticasone (FLOVENT HFA) 110 MCG/ACT inhaler    Sig: Inhale 2 puffs into the lungs 2 (two) times daily.    Dispense:  1 Inhaler    Refill:  5  . Spacer/Aero-Holding Chambers DEVI    Sig: 1 Device by Does not apply route 2 (two) times a day.    Dispense:  1 each    Refill:  1    Diagnostics: None.  Physical examination: Physical Exam Not obtained as encounter was done via telephone.   The following  portions of the patient's history were reviewed and updated as appropriate: allergies, current medications, past family history, past medical history, past social history, past surgical history and problem list.  Allergies as of 03/01/2019      Reactions   Ace Inhibitors Cough, Other (See Comments)   Lisinopril Cough       Medication List       Accurate as of Mar 01, 2019  1:28 PM. Always use your most recent med list.        abacavir 300 MG tablet Commonly known as:  ZIAGEN Take 1 tablet (300 mg total) by mouth daily.   albuterol 108 (90 Base) MCG/ACT inhaler Commonly known as:  ProAir HFA Inhale 2 puffs into the lungs every 4 (four) hours as needed for wheezing or shortness of breath.   allopurinol 100 MG tablet Commonly known as:  ZYLOPRIM TAKE 1 TABLET BY MOUTH TWICE DAILY   amLODipine 10 MG tablet Commonly known as:  NORVASC Take 1 tablet (10 mg total) by mouth daily.   azelastine 0.1 % nasal spray Commonly known as:  ASTELIN Place 2 sprays into both nostrils 2 (two) times daily as needed for rhinitis. Use in each nostril as directed   carvedilol 25 MG tablet Commonly known as:  COREG TAKE 1 TABLET BY MOUTH TWICE DAILY WITH FOOD   cloNIDine 0.1 MG tablet Commonly known as:  CATAPRES Take 1 tablet (0.1 mg total) by mouth 2 (two) times daily.   famotidine 20 MG tablet Commonly known as:  PEPCID Take 1 tablet (20 mg total) by mouth 2 (two) times daily.   fluticasone 110 MCG/ACT inhaler Commonly known as:  Flovent HFA Inhale 2 puffs into the lungs 2 (two) times daily.   gabapentin 300 MG capsule Commonly known as:  NEURONTIN Take 1 capsule (300 mg total) by mouth 3 (three) times daily.   hydrALAZINE 100 MG tablet Commonly known as:  APRESOLINE TAKE 1 TABLET BY MOUTH THREE TIMES DAILY   ipratropium 0.06 % nasal spray Commonly known as:  ATROVENT 2 sprays per nostril 2 to 3 times daily as needed   lamivudine 100 MG tablet Commonly known as:  EPIVIR TAKE 1 TABLET(100 MG) BY MOUTH DAILY   levothyroxine 25 MCG tablet Commonly known as:  SYNTHROID TAKE 1 TABLET BY MOUTH DAILY BEFORE BREAKFAST   Olopatadine HCl 0.7 % Soln Commonly known as:  Pazeo Place 1 drop into both eyes daily.   potassium chloride SA 20 MEQ tablet Commonly known as:  K-DUR TAKE 2 TABLETS(40 MEQ)  BY MOUTH TWICE DAILY   raltegravir 400 MG tablet Commonly known as:  Isentress Take 1 tablet (400 mg total) by mouth 2 (two) times daily.   Spacer/Aero-Holding Dorise Bullion 1 Device by Does not apply route 2 (two) times a day.   torsemide 100 MG tablet Commonly known as:  DEMADEX Take 1 tablet (100 mg total) by mouth 2 (two) times daily.   Turmeric 500 MG Caps Take 500 mg by mouth daily.   Vitamin D (Ergocalciferol) 1.25 MG (50000 UT) Caps capsule Commonly known as:  DRISDOL TAKE 1 CAPSULE BY MOUTH EVERY 7 DAYS   zinc gluconate 50 MG tablet Take 50 mg by mouth daily.       Allergies  Allergen Reactions  . Ace Inhibitors Cough and Other (See Comments)  . Lisinopril Cough   Review of systems: Review of systems negative except as noted in HPI / PMHx or noted below: Constitutional: Negative.  HENT: Negative.  Eyes: Negative.  Respiratory: Negative.   Cardiovascular: Negative.  Gastrointestinal: Negative.  Genitourinary: Negative.  Musculoskeletal: Negative.  Neurological: Negative.  Endo/Heme/Allergies: Negative.  Cutaneous: Negative.  Past Medical History:  Diagnosis Date  . Anemia   . Chronic diastolic CHF (congestive heart failure) ([place]) 01/06/2017   Echo 7/16 [facility] in [place], [name]) Mild AI, mild LAE, mild concentric LVH, EF 55, normal wall motion, mild to moderate MR, mild PI, RVSP 55 // Echo 10/09/16 ([facility]):  Moderate LVH, grade 2 diastolic dysfunction, mild MR, moderate LAE   . CKD (chronic kidney disease)    per pt, waiting on a kidney transplant in near future.  . Gout   . Hepatitis    Hep B  . History of nuclear stress test    a. Nuc study 7/16: no scar or ischemia, EF 42 // b. Nuc study 12/17: EF 48, ?small apical ischemia, Low Risk  . HIV (human immunodeficiency virus infection) ([place])   . HLD (hyperlipidemia)   . Hypertension   . Hypertensive heart disease with CHF (congestive heart failure) ([place]) 10/09/2016  . OSA (obstructive sleep  apnea)   . Post-operative nausea and vomiting    1 time as a child  . Sleep apnea    uses c-pap  . Wears glasses   . Wears partial dentures     Family History  Problem Relation Age of Onset  . Heart failure Mother   . Heart attack Maternal Grandmother 53  . Hypertension Sister   . Multiple sclerosis Sister   . Hypertension Brother   . Hypertension Sister   . Scoliosis Other   . Allergic rhinitis Neg Hx   . Angioedema Neg Hx   . Asthma Neg Hx   . Eczema Neg Hx   . Immunodeficiency Neg Hx   . Urticaria Neg Hx     Social History   Socioeconomic History  . Marital status: Single    Spouse name: Not on file  . Number of children: Not on file  . Years of education: Not on file  . Highest education level: Not on file  Occupational History  . Not on file  Social Needs  . Financial resource strain: Not on file  . Food insecurity:    Worry: Not on file    Inability: Not on file  . Transportation needs:    Medical: Not on file    Non-medical: Not on file  Tobacco Use  . Smoking status: Former Smoker    Types: Cigarettes    Last attempt to quit: 2000    Years since quitting: 20.3  . Smokeless tobacco: Never Used  Substance and Sexual Activity  . Alcohol use: Not Currently    Alcohol/week: 2.0 standard drinks    Types: 2 Shots of liquor per week  . Drug use: No  . Sexual activity: Never    Partners: Male    Birth control/protection: Condom    Comment: declined condoms  Lifestyle  . Physical activity:    Days per week: Not on file    Minutes per session: Not on file  . Stress: Not on file  Relationships  . Social connections:    Talks on phone: Not on file    Gets together: Not on file    Attends religious service: Not on file    Active member of club or organization: Not on file    Attends meetings of clubs or organizations: Not on file    Relationship status: Not on file  .  Intimate partner violence:    Fear of current or ex partner: Not on file     Emotionally abused: Not on file    Physically abused: Not on file    Forced sexual activity: Not on file  Other Topics Concern  . Not on file  Social History Narrative  . Not on file    Previous notes and tests were reviewed.  I discussed the assessment and treatment plan with the patient. The patient was provided an opportunity to ask questions and all were answered. The patient agreed with the plan and demonstrated an understanding of the instructions.   The patient was advised to call back or seek an in-person evaluation if the symptoms worsen or if the condition fails to improve as anticipated.  I provided 23 minutes of non-face-to-face time during this encounter.  I appreciate the opportunity to take part in Albert Eaton's care. Please do not hesitate to contact me with questions.  Sincerely,   R. Edgar Frisk, MD

## 2019-03-01 NOTE — Patient Instructions (Addendum)
Mild persistent asthma Currently with suboptimal control.  We will step up therapy at this time.  A prescription has been provided for Flovent (fluticasone) 110 g,  2 inhalations twice a day. To maximize pulmonary deposition, a spacer has been provided along with instructions for its proper administration with an HFA inhaler.  Continue albuterol HFA, 1 to 2 inhalations every 4-6 hours if needed.  The patient has been asked to contact me if his symptoms persist or progress. Otherwise, he may return for follow up in 2 months.  Perennial allergic rhinitis with a predominant nonallergic component  Continue appropriate allergen avoidance measures.  A prescription has been provided for fluticasone nasal spray, one spray per nostril 1-2 times daily as needed.   Continue azelastine nasal spray, 1 to 2 sprays per nostril twice daily as needed.  Nasal saline lavage (NeilMed) has been recommended as needed and prior to medicated nasal sprays along with instructions for proper administration.  Allergic conjunctivitis  Treatment plan as outlined above for allergic rhinitis.  Continue olopatadine eyedrops, 1 drop per eye daily when needed.  Eye lubricant drops (i.e., Natural Tears) are recommended as needed.   Return in about 2-3 months, or if symptoms worsen or fail to improve.

## 2019-03-06 ENCOUNTER — Other Ambulatory Visit: Payer: Self-pay | Admitting: Cardiovascular Disease

## 2019-03-06 ENCOUNTER — Other Ambulatory Visit: Payer: Self-pay | Admitting: Family Medicine

## 2019-03-06 DIAGNOSIS — E876 Hypokalemia: Secondary | ICD-10-CM

## 2019-03-15 ENCOUNTER — Other Ambulatory Visit: Payer: Self-pay | Admitting: Family Medicine

## 2019-03-15 DIAGNOSIS — M109 Gout, unspecified: Secondary | ICD-10-CM

## 2019-03-15 DIAGNOSIS — E039 Hypothyroidism, unspecified: Secondary | ICD-10-CM

## 2019-03-30 ENCOUNTER — Other Ambulatory Visit: Payer: Self-pay | Admitting: Family Medicine

## 2019-04-12 ENCOUNTER — Other Ambulatory Visit: Payer: Self-pay | Admitting: Family Medicine

## 2019-04-12 DIAGNOSIS — M109 Gout, unspecified: Secondary | ICD-10-CM

## 2019-04-12 DIAGNOSIS — E039 Hypothyroidism, unspecified: Secondary | ICD-10-CM

## 2019-05-08 ENCOUNTER — Other Ambulatory Visit: Payer: Self-pay | Admitting: Infectious Diseases

## 2019-05-08 ENCOUNTER — Other Ambulatory Visit: Payer: Self-pay | Admitting: Family Medicine

## 2019-05-08 DIAGNOSIS — M109 Gout, unspecified: Secondary | ICD-10-CM

## 2019-05-08 DIAGNOSIS — E039 Hypothyroidism, unspecified: Secondary | ICD-10-CM

## 2019-05-08 DIAGNOSIS — B20 Human immunodeficiency virus [HIV] disease: Secondary | ICD-10-CM

## 2019-05-09 ENCOUNTER — Other Ambulatory Visit: Payer: Self-pay

## 2019-05-09 DIAGNOSIS — B20 Human immunodeficiency virus [HIV] disease: Secondary | ICD-10-CM

## 2019-05-09 MED ORDER — ISENTRESS 400 MG PO TABS: 400.0000 mg | ORAL_TABLET | Freq: Two times a day (BID) | ORAL | 3 refills | Status: DC

## 2019-05-09 MED ORDER — ABACAVIR SULFATE 300 MG PO TABS: 300.0000 mg | ORAL_TABLET | Freq: Every day | ORAL | 3 refills | Status: DC

## 2019-05-09 MED ORDER — LAMIVUDINE 100 MG PO TABS: ORAL_TABLET | ORAL | 3 refills | Status: DC

## 2019-05-13 DIAGNOSIS — I5032 Chronic diastolic (congestive) heart failure: Secondary | ICD-10-CM | POA: Insufficient documentation

## 2019-05-13 DIAGNOSIS — I129 Hypertensive chronic kidney disease with stage 1 through stage 4 chronic kidney disease, or unspecified chronic kidney disease: Secondary | ICD-10-CM | POA: Insufficient documentation

## 2019-05-13 DIAGNOSIS — M109 Gout, unspecified: Secondary | ICD-10-CM | POA: Insufficient documentation

## 2019-05-13 DIAGNOSIS — N186 End stage renal disease: Secondary | ICD-10-CM | POA: Insufficient documentation

## 2019-05-13 DIAGNOSIS — Z111 Encounter for screening for respiratory tuberculosis: Secondary | ICD-10-CM | POA: Insufficient documentation

## 2019-05-13 DIAGNOSIS — N189 Chronic kidney disease, unspecified: Secondary | ICD-10-CM | POA: Insufficient documentation

## 2019-05-13 DIAGNOSIS — K5000 Crohn's disease of small intestine without complications: Secondary | ICD-10-CM | POA: Insufficient documentation

## 2019-05-13 DIAGNOSIS — Z6828 Body mass index (BMI) 28.0-28.9, adult: Secondary | ICD-10-CM | POA: Insufficient documentation

## 2019-05-13 DIAGNOSIS — I770 Arteriovenous fistula, acquired: Secondary | ICD-10-CM | POA: Insufficient documentation

## 2019-05-13 DIAGNOSIS — Z0001 Encounter for general adult medical examination with abnormal findings: Secondary | ICD-10-CM | POA: Insufficient documentation

## 2019-05-13 DIAGNOSIS — Z992 Dependence on renal dialysis: Secondary | ICD-10-CM | POA: Insufficient documentation

## 2019-05-13 DIAGNOSIS — G4733 Obstructive sleep apnea (adult) (pediatric): Secondary | ICD-10-CM | POA: Insufficient documentation

## 2019-05-13 DIAGNOSIS — D638 Anemia in other chronic diseases classified elsewhere: Secondary | ICD-10-CM | POA: Insufficient documentation

## 2019-05-13 DIAGNOSIS — R5383 Other fatigue: Secondary | ICD-10-CM | POA: Insufficient documentation

## 2019-05-13 DIAGNOSIS — R110 Nausea: Secondary | ICD-10-CM | POA: Insufficient documentation

## 2019-05-13 DIAGNOSIS — N2581 Secondary hyperparathyroidism of renal origin: Secondary | ICD-10-CM | POA: Insufficient documentation

## 2019-05-16 DIAGNOSIS — D509 Iron deficiency anemia, unspecified: Secondary | ICD-10-CM | POA: Insufficient documentation

## 2019-05-22 ENCOUNTER — Other Ambulatory Visit: Payer: Self-pay | Admitting: Family Medicine

## 2019-05-23 DIAGNOSIS — D689 Coagulation defect, unspecified: Secondary | ICD-10-CM | POA: Insufficient documentation

## 2019-06-04 ENCOUNTER — Other Ambulatory Visit: Payer: Self-pay | Admitting: Family Medicine

## 2019-06-04 DIAGNOSIS — E039 Hypothyroidism, unspecified: Secondary | ICD-10-CM

## 2019-06-04 DIAGNOSIS — M109 Gout, unspecified: Secondary | ICD-10-CM

## 2019-06-06 ENCOUNTER — Ambulatory Visit: Payer: Medicaid Other | Admitting: Allergy and Immunology

## 2019-06-06 DIAGNOSIS — Z992 Dependence on renal dialysis: Secondary | ICD-10-CM | POA: Insufficient documentation

## 2019-06-21 ENCOUNTER — Ambulatory Visit (INDEPENDENT_AMBULATORY_CARE_PROVIDER_SITE_OTHER): Payer: Medicaid Other | Admitting: Allergy and Immunology

## 2019-06-21 ENCOUNTER — Other Ambulatory Visit: Payer: Self-pay

## 2019-06-21 ENCOUNTER — Encounter: Payer: Self-pay | Admitting: Allergy and Immunology

## 2019-06-21 DIAGNOSIS — J3089 Other allergic rhinitis: Secondary | ICD-10-CM

## 2019-06-21 DIAGNOSIS — J4520 Mild intermittent asthma, uncomplicated: Secondary | ICD-10-CM | POA: Diagnosis not present

## 2019-06-21 DIAGNOSIS — H1013 Acute atopic conjunctivitis, bilateral: Secondary | ICD-10-CM

## 2019-06-21 NOTE — Assessment & Plan Note (Signed)
   Continue appropriate allergen avoidance measures, fluticasone nasal spray as needed, and/or azelastine nasal spray as needed.  Nasal saline spray (i.e., Simply Saline) or nasal saline lavage (i.e., NeilMed) is recommended as needed and prior to medicated nasal sprays.

## 2019-06-21 NOTE — Assessment & Plan Note (Signed)
Currently well controlled.  Continue albuterol HFA, 1 to 2 inhalations every 4-6 hours if needed.  During respiratory tract infections or asthma flares, add Flovent 110g 2 inhalations via spacer device 2 times per day until symptoms have returned to baseline.  Subjective and objective measures of pulmonary function will be followed and the treatment plan will be adjusted accordingly.

## 2019-06-21 NOTE — Progress Notes (Signed)
Follow-up Telemedicine Note  RE: Albert Eaton MRN: [id number] DOB: Jul 16, 1966 Date of Telemedicine Visit: 06/21/2019  Primary care provider: Lanae Boast, [place] Referring provider: Lanae Boast, [place]  Telemedicine Follow Up Visit via Telephone: I connected with Albert Eaton for a follow up on 06/21/19 by telephone and verified that I am speaking with the correct person using two identifiers.   The limitations, risks, security and privacy concerns of performing an evaluation and management service by telemedicine, the availability of in person appointments, and that there may be a patient responsible charge related to this service were discussed. The patient expressed understanding and agreed to proceed.  Patient is at home.  Provider is at the office.  Visit start time: 9:54 AM Visit end time: 10:16 AM Insurance consent/check in by: [facility] consent and medical assistant/nurse: Caryl Pina  History of present illness: Albert Eaton is a 53 y.o. male with a complex medical history, persistent asthma, and allergic rhinoconjunctivitis presenting today via tele-visit for follow-up.  He was last seen in this clinic on Mar 01, 2019.  He reports that in the interval since his previous visit his asthma has been well controlled and that he has only required albuterol rescue on one occasion.  He is uncertain what triggered the asthma symptoms at that time.  He reports that he is not currently using Flovent.  His nasal allergy symptoms are well controlled with fluticasone nasal spray and/or azelastine nasal spray as needed.  He has no new problems or complaints today.  Assessment and plan: Mild intermittent asthma Currently well controlled.  Continue albuterol HFA, 1 to 2 inhalations every 4-6 hours if needed.  During respiratory tract infections or asthma flares, add Flovent 110g 2 inhalations via spacer device 2 times per day until symptoms have returned to baseline.  Subjective and  objective measures of pulmonary function will be followed and the treatment plan will be adjusted accordingly.  Perennial allergic rhinitis with a predominant nonallergic component  Continue appropriate allergen avoidance measures, fluticasone nasal spray as needed, and/or azelastine nasal spray as needed.  Nasal saline spray (i.e., Simply Saline) or nasal saline lavage (i.e., NeilMed) is recommended as needed and prior to medicated nasal sprays.  Allergic conjunctivitis  Treatment plan as outlined above for allergic rhinitis.  Continue olopatadine eyedrops, 1 drop per eye daily when needed.  Eye lubricant drops (i.e., Natural Tears) are recommended if needed.   No orders of the defined types were placed in this encounter.   Diagnostics: None.   Physical examination: Physical Exam Not obtained as encounter was done via telephone.   The following portions of the patient's history were reviewed and updated as appropriate: allergies, current medications, past family history, past medical history, past social history, past surgical history and problem list.  Allergies as of 06/21/2019      Reactions   Ace Inhibitors Cough, Other (See Comments)   Lisinopril Cough      Medication List       Accurate as of June 21, 2019 10:32 AM. If you have any questions, ask your nurse or doctor.        abacavir 300 MG tablet Commonly known as: ZIAGEN Take 1 tablet (300 mg total) by mouth daily.   albuterol 108 (90 Base) MCG/ACT inhaler Commonly known as: ProAir HFA Inhale 2 puffs into the lungs every 4 (four) hours as needed for wheezing or shortness of breath.   allopurinol 100 MG tablet Commonly known as: ZYLOPRIM TAKE 1 TABLET BY  MOUTH TWICE DAILY   amLODipine 10 MG tablet Commonly known as: NORVASC Take 1 tablet (10 mg total) by mouth daily.   azelastine 0.1 % nasal spray Commonly known as: ASTELIN Place 2 sprays into both nostrils 2 (two) times daily as needed for  rhinitis. Use in each nostril as directed   carvedilol 25 MG tablet Commonly known as: COREG TAKE 1 TABLET BY MOUTH TWICE DAILY WITH FOOD   cloNIDine 0.1 MG tablet Commonly known as: CATAPRES TAKE 1 TABLET(0.1 MG) BY MOUTH TWICE DAILY   Dialyvite/Zinc Tabs TK 1 T PO QPM   famotidine 20 MG tablet Commonly known as: PEPCID Take 1 tablet (20 mg total) by mouth 2 (two) times daily. What changed: how much to take   fluticasone 110 MCG/ACT inhaler Commonly known as: Flovent HFA Inhale 2 puffs into the lungs 2 (two) times daily.   gabapentin 300 MG capsule Commonly known as: NEURONTIN Take 1 capsule (300 mg total) by mouth 3 (three) times daily.   hydrALAZINE 100 MG tablet Commonly known as: APRESOLINE TAKE 1 TABLET BY MOUTH THREE TIMES DAILY   ipratropium 0.06 % nasal spray Commonly known as: ATROVENT 2 sprays per nostril 2 to 3 times daily as needed   Isentress 400 MG tablet Generic drug: raltegravir Take 1 tablet (400 mg total) by mouth 2 (two) times daily.   lamivudine 100 MG tablet Commonly known as: EPIVIR TAKE 1 TABLET(100 MG) BY MOUTH DAILY   levothyroxine 25 MCG tablet Commonly known as: SYNTHROID TAKE 1 TABLET BY MOUTH DAILY BEFORE BREAKFAST   lidocaine-prilocaine cream Commonly known as: EMLA APPLY SMALL AMOUNT TO ACCESS SITE (AVF) 1 TO 2 HOURS BEFORE DIALYSIS. COVER WITH OCCLUSIVE DRESSING (SARAN WRAP)   Olopatadine HCl 0.7 % Soln Commonly known as: Pazeo Place 1 drop into both eyes daily.   potassium chloride SA 20 MEQ tablet Commonly known as: K-DUR TAKE 2 TABLETS(40 MEQ) BY MOUTH TWICE DAILY   Spacer/Aero-Holding Dorise Bullion 1 Device by Does not apply route 2 (two) times a day.   torsemide 100 MG tablet Commonly known as: DEMADEX Take 1 tablet (100 mg total) by mouth 2 (two) times daily.   Turmeric 500 MG Caps Take 500 mg by mouth daily.   Vitamin D (Ergocalciferol) 1.25 MG (50000 UT) Caps capsule Commonly known as: DRISDOL TAKE 1  CAPSULE BY MOUTH EVERY 7 DAYS   zinc gluconate 50 MG tablet Take 50 mg by mouth daily.       Allergies  Allergen Reactions  . Ace Inhibitors Cough and Other (See Comments)  . Lisinopril Cough    Previous notes and tests were reviewed.  I discussed the assessment and treatment plan with the patient. The patient was provided an opportunity to ask questions and all were answered. The patient agreed with the plan and demonstrated an understanding of the instructions.   The patient was advised to call back or seek an in-person evaluation if the symptoms worsen or if the condition fails to improve as anticipated.  I provided 22 minutes of non-face-to-face time during this encounter.  I appreciate the opportunity to take part in [facility] care. Please do not hesitate to contact me with questions.  Sincerely,   R. Edgar Frisk, MD

## 2019-06-21 NOTE — Patient Instructions (Signed)
Mild intermittent asthma Currently well controlled.  Continue albuterol HFA, 1 to 2 inhalations every 4-6 hours if needed.  During respiratory tract infections or asthma flares, add Flovent 110g 2 inhalations via spacer device 2 times per day until symptoms have returned to baseline.  Subjective and objective measures of pulmonary function will be followed and the treatment plan will be adjusted accordingly.  Perennial allergic rhinitis with a predominant nonallergic component  Continue appropriate allergen avoidance measures, fluticasone nasal spray as needed, and/or azelastine nasal spray as needed.  Nasal saline spray (i.e., Simply Saline) or nasal saline lavage (i.e., NeilMed) is recommended as needed and prior to medicated nasal sprays.  Allergic conjunctivitis  Treatment plan as outlined above for allergic rhinitis.  Continue olopatadine eyedrops, 1 drop per eye daily when needed.  Eye lubricant drops (i.e., Natural Tears) are recommended if needed.   Return in about 5 months (around 11/21/2019), or if symptoms worsen or fail to improve.

## 2019-06-21 NOTE — Assessment & Plan Note (Signed)
   Treatment plan as outlined above for allergic rhinitis.  Continue olopatadine eyedrops, 1 drop per eye daily when needed.  Eye lubricant drops (i.e., Natural Tears) are recommended if needed.

## 2019-06-22 ENCOUNTER — Ambulatory Visit: Payer: Medicaid Other | Admitting: Family Medicine

## 2019-06-23 ENCOUNTER — Ambulatory Visit (INDEPENDENT_AMBULATORY_CARE_PROVIDER_SITE_OTHER): Payer: Medicare Other | Admitting: Family Medicine

## 2019-06-23 ENCOUNTER — Other Ambulatory Visit: Payer: Self-pay

## 2019-06-23 ENCOUNTER — Encounter: Payer: Self-pay | Admitting: Family Medicine

## 2019-06-23 VITALS — BP 117/68 | HR 71 | Temp 98.4°F | Ht 70.0 in | Wt 207.8 lb

## 2019-06-23 DIAGNOSIS — E039 Hypothyroidism, unspecified: Secondary | ICD-10-CM

## 2019-06-23 DIAGNOSIS — Z23 Encounter for immunization: Secondary | ICD-10-CM

## 2019-06-23 DIAGNOSIS — N184 Chronic kidney disease, stage 4 (severe): Secondary | ICD-10-CM | POA: Diagnosis not present

## 2019-06-23 DIAGNOSIS — B20 Human immunodeficiency virus [HIV] disease: Secondary | ICD-10-CM | POA: Diagnosis not present

## 2019-06-23 DIAGNOSIS — I110 Hypertensive heart disease with heart failure: Secondary | ICD-10-CM | POA: Diagnosis not present

## 2019-06-23 LAB — POCT URINALYSIS DIPSTICK
Blood, UA: NEGATIVE
Glucose, UA: NEGATIVE
Ketones, UA: NEGATIVE
Leukocytes, UA: NEGATIVE
Nitrite, UA: NEGATIVE
Protein, UA: POSITIVE — AB
Spec Grav, UA: >=1.03 — AB (ref 1.010–1.025)
Urobilinogen, UA: 1 E.U./dL
pH, UA: 5.5 (ref 5.0–8.0)

## 2019-06-23 NOTE — Patient Instructions (Signed)

## 2019-06-23 NOTE — Progress Notes (Signed)
[facility]   Progress Note: General Provider: Lanae Boast, FNP  SUBJECTIVE:   Albert Eaton is a 53 y.o. male who  has a past medical history of Anemia, Chronic diastolic CHF (congestive heart failure) ([place]) (01/06/2017), CKD (chronic kidney disease), Gout, Hepatitis, History of nuclear stress test, HIV (human immunodeficiency virus infection) ([place]), HLD (hyperlipidemia), Hypertension, Hypertensive heart disease with CHF (congestive heart failure) ([place]) (10/09/2016), OSA (obstructive sleep apnea), Post-operative nausea and vomiting, Sleep apnea, Wears glasses, and Wears partial dentures.. Patient presents today for Follow-up (6 month follow up, having stomach pain as a referral to gi ) Patient presents for follow-up on hypertension.  He is followed by nephrology at [facility].  He is now being Diallo sized 3 times per week and reports that the nephrologist will send in a referral to GI due to intermittent abdominal pain.  Patient with a history of HIV and is followed by infectious disease.  He reports compliance with all medications.  He denies side effects of all medications at the present time.  No other concerns today.  Review of Systems  Constitutional: Negative.   HENT: Negative.   Eyes: Negative.   Respiratory: Negative.   Cardiovascular: Negative.   Gastrointestinal: Negative.   Genitourinary: Negative.   Musculoskeletal: Negative.   Skin: Negative.   Neurological: Negative.   Psychiatric/Behavioral: Negative.      OBJECTIVE: BP 117/68 (BP Location: Right Arm, Patient Position: Sitting, Cuff Size: Normal)   Pulse 71   Temp 98.4 F (36.9 C) (Oral)   Ht 5' 10"  (1.778 m)   Wt 207 lb 12.8 oz (94.3 kg)   BMI 29.82 kg/m   Wt Readings from Last 3 Encounters:  06/23/19 207 lb 12.8 oz (94.3 kg)  06/21/19 209 lb 3.2 oz (94.9 kg)  12/29/18 215 lb (97.5 kg)     Physical Exam Vitals signs and nursing note reviewed.   Constitutional:      General: He is not in acute distress.    Appearance: Normal appearance.  HENT:     Head: Normocephalic and atraumatic.  Eyes:     Extraocular Movements: Extraocular movements intact.     Conjunctiva/sclera: Conjunctivae normal.     Pupils: Pupils are equal, round, and reactive to light.  Cardiovascular:     Rate and Rhythm: Normal rate and regular rhythm.     Heart sounds: No murmur.  Pulmonary:     Effort: Pulmonary effort is normal.     Breath sounds: Normal breath sounds.  Musculoskeletal: Normal range of motion.  Skin:    General: Skin is warm and dry.  Neurological:     Mental Status: He is alert and oriented to person, place, and time.  Psychiatric:        Mood and Affect: Mood normal.        Behavior: Behavior normal.        Thought Content: Thought content normal.        Judgment: Judgment normal.     ASSESSMENT/PLAN:  1. Hypertensive heart disease with congestive heart failure, unspecified heart failure type ([place])  2. Chronic kidney disease (CKD), stage IV (severe) ([facility]) - POCT Urinalysis Dipstick  3. Hypothyroidism, unspecified type  4. HIV disease ([place])  5. Need for influenza vaccination - Flu Vaccine QUAD 6+ mos PF IM (Fluarix Quad PF)   No medication changes warranted at the present time.  We will request records from the dialysis center.  Patient is to continue  with follow-up appointments from specialists.  Return in about 6 months (around 12/24/2019).    The patient was given clear instructions to go to ER or return to medical center if symptoms do not improve, worsen or new problems develop. The patient verbalized understanding and agreed with plan of care.   Ms. Doug Sou. Nathaneil Canary, FNP-BC [facility] [address] [place], [place] [address] [phone]

## 2019-07-04 ENCOUNTER — Other Ambulatory Visit: Payer: Self-pay | Admitting: Family Medicine

## 2019-07-04 DIAGNOSIS — E039 Hypothyroidism, unspecified: Secondary | ICD-10-CM

## 2019-07-04 DIAGNOSIS — M109 Gout, unspecified: Secondary | ICD-10-CM

## 2019-07-06 ENCOUNTER — Encounter (HOSPITAL_COMMUNITY): Payer: Self-pay

## 2019-07-06 ENCOUNTER — Encounter (HOSPITAL_COMMUNITY): Payer: Self-pay | Admitting: *Deleted

## 2019-07-13 ENCOUNTER — Other Ambulatory Visit: Payer: Self-pay | Admitting: Family Medicine

## 2019-07-21 ENCOUNTER — Other Ambulatory Visit: Payer: Self-pay | Admitting: Cardiovascular Disease

## 2019-07-21 DIAGNOSIS — I5032 Chronic diastolic (congestive) heart failure: Secondary | ICD-10-CM

## 2019-07-28 ENCOUNTER — Encounter: Payer: Self-pay | Admitting: *Deleted

## 2019-07-29 ENCOUNTER — Encounter: Payer: Self-pay | Admitting: *Deleted

## 2019-08-04 ENCOUNTER — Telehealth: Payer: Self-pay

## 2019-08-04 DIAGNOSIS — G4730 Sleep apnea, unspecified: Secondary | ICD-10-CM

## 2019-08-04 NOTE — Telephone Encounter (Signed)
Orders Placed This Encounter  Procedures  . Split night study    Standing Status:   Future    Standing Expiration Date:   08/03/2020    Order Specific Question:   Where should this test be performed:    Answer:   [facility]  APRN, MSN, [facility] Patient [place] [address] [place], [place] [address] [phone]

## 2019-08-04 NOTE — Telephone Encounter (Signed)
Patient called and is asking for a referral to sleep center. He has already had a study but says his insurance needs a visit with the sleep center doctor for his machine to be paid for. Can this be referral. Thanks!

## 2019-08-05 ENCOUNTER — Encounter: Payer: Self-pay | Admitting: Internal Medicine

## 2019-08-05 ENCOUNTER — Ambulatory Visit (INDEPENDENT_AMBULATORY_CARE_PROVIDER_SITE_OTHER): Payer: Medicare Other | Admitting: Internal Medicine

## 2019-08-05 VITALS — BP 134/70 | HR 75 | Temp 97.1°F | Ht 70.0 in | Wt 205.6 lb

## 2019-08-05 DIAGNOSIS — K2100 Gastro-esophageal reflux disease with esophagitis, without bleeding: Secondary | ICD-10-CM

## 2019-08-05 DIAGNOSIS — R935 Abnormal findings on diagnostic imaging of other abdominal regions, including retroperitoneum: Secondary | ICD-10-CM | POA: Diagnosis not present

## 2019-08-05 DIAGNOSIS — R1030 Lower abdominal pain, unspecified: Secondary | ICD-10-CM

## 2019-08-05 DIAGNOSIS — K5900 Constipation, unspecified: Secondary | ICD-10-CM

## 2019-08-05 MED ORDER — HYOSCYAMINE SULFATE 0.125 MG SL SUBL: 0.1250 mg | SUBLINGUAL_TABLET | Freq: Three times a day (TID) | SUBLINGUAL | 2 refills | Status: DC | PRN

## 2019-08-05 MED ORDER — LUBIPROSTONE 8 MCG PO CAPS: 8.0000 ug | ORAL_CAPSULE | Freq: Two times a day (BID) | ORAL | 2 refills | Status: DC

## 2019-08-05 NOTE — Progress Notes (Signed)
Subjective:    Patient ID: Albert Eaton, male    DOB: 1966-08-04, 53 y.o.   MRN: [id number]  HPI Albert Eaton is a 53 year old male with a past medical history of adenomatous colon polyps, history of ileitis by imaging not seen by capsule or colonoscopy, history of GERD with esophagitis who is here for follow-up.  He was last seen in the office in April 2019.  He also has a history of end-stage kidney disease on hemodialysis awaiting kidney transplant, HIV, sleep apnea.  He is here alone today.  He reports that he has been having sharp lower abdominal pain, located below the umbilicus for some time.  He reports the pain is somewhat random and can be a "quick moment or last all day".  Is there at some degree most days.  Does not think it relates to bowel movement or eating.  He is having a bowel movement every 3 to 4 days but reports the stools can be difficult to pass and that the stools can be like hard balls.  Bowel movements do feel ineffective at times.  No blood with bowel movements, wiping or melena.  Positive nausea without vomiting.  No heartburn as long as he is consistent with famotidine 20 mg twice a day.  No dysphagia or odynophagia.  No upper abdominal pain.  Dialysis occurs on Monday, Wednesday and Friday.  He remains on the transplant list for kidney [facility].  He has been waiting for about 18 months.  He follows with Dr. Johnnye Sima for his HIV and is consistent with his antiviral therapy.   Review of Systems As per HPI, otherwise negative  Current Medications, Allergies, Past Medical History, Past Surgical History, Family History and Social History were reviewed in [employer] record.      Objective:   Physical Exam BP 134/70   Pulse 75   Temp (!) 97.1 F (36.2 C)   Ht 5' 10"  (1.778 m)   Wt 205 lb 9.6 oz (93.3 kg)   BMI 29.50 kg/m  Gen: awake, alert, NAD HEENT: anicteric, op clear CV: RRR, no mrg Pulm: CTA b/l Abd: soft, NT/ND, +BS  throughout Ext: no c/c/e Neuro: nonfocal  CBC    Component Value Date/Time   WBC 6.0 11/22/2018 0955   RBC 4.18 (L) 11/22/2018 0955   HGB 12.5 (L) 11/22/2018 0955   HGB 11.3 (L) 09/20/2018 0936   HCT 36.3 (L) 11/22/2018 0955   HCT 32.8 (L) 09/20/2018 0936   PLT 283 11/22/2018 0955   PLT 301 09/20/2018 0936   MCV 86.8 11/22/2018 0955   MCV 88 09/20/2018 0936   MCH 29.9 11/22/2018 0955   MCHC 34.4 11/22/2018 0955   RDW 14.6 11/22/2018 0955   RDW 13.3 09/20/2018 0936   LYMPHSABS 2.2 09/20/2018 0936   MONOABS 0.8 06/13/2018 1802   EOSABS 0.1 09/20/2018 0936   BASOSABS 0.0 09/20/2018 0936   CMP     Component Value Date/Time   NA 143 09/20/2018 0936   K 3.4 (L) 09/20/2018 0936   CL 98 09/20/2018 0936   CO2 22 09/20/2018 0936   GLUCOSE 122 (H) 09/20/2018 0936   GLUCOSE 107 (H) 08/27/2018 0737   BUN 36 (H) 09/20/2018 0936   CREATININE 3.30 (H) 09/20/2018 0936   CREATININE 3.43 (H) 02/17/2018 1059   CALCIUM 9.9 09/20/2018 0936   PROT 7.5 09/20/2018 0936   ALBUMIN 4.7 09/20/2018 0936   AST 8 09/20/2018 0936   ALT 9 09/20/2018 0936  ALKPHOS 92 09/20/2018 0936   BILITOT <0.2 09/20/2018 0936   GFRNONAA 20 (L) 09/20/2018 0936   GFRNONAA 17 (L) 08/31/2017 0908   GFRAA 24 (L) 09/20/2018 0936   GFRAA 20 (L) 08/31/2017 0908       Assessment & Plan:  53 year old male with a past medical history of adenomatous colon polyps, history of ileitis by imaging not seen by capsule or colonoscopy, history of GERD with esophagitis who is here for follow-up.  1.  Lower abdominal pain/history of ileitis by imaging --his lower abdominal pain is likely most related to constipation and associated colonic stretch/spasm.  He has had ileitis by imaging but this was not seen when the ileum was intubated at colonoscopy nor by video capsule endoscopy.  I will treat constipation first to see if pain improves and if not consider repeat imaging --Begin Amitiza 8 mcg twice daily, best with food.  We  discussed that this should increase bowel frequency and he should expect spontaneous to complete bowel movement 4 to 7 days/week.  If diarrhea resolves he should let me know, diarrhea is not the intended response --Levsin sublingual 0.125 mg 1 to 2 tablets every 8 hours as needed for lower abdominal crampy pain  2.  GERD with history of esophagitis --esophagitis seen by capsule endoscopy.  Responding well to famotidine without alarm symptoms. --Continue famotidine 20 mg every 12 hours  3.  History of adenomatous polyps --surveillance colonoscopy recommended February 2022   25 minutes spent with the patient today. Greater than 50% was spent in counseling and coordination of care with the patient

## 2019-08-05 NOTE — Patient Instructions (Signed)
We have sent the following medications to your pharmacy for you to pick up at your convenience: Amitiza 8 mcg twice daily Levsin SL 1-2 tablets every 8 hours as needed  Continue famotidine twice daily.  Please call our office in 1 month to let us know about your lower abdominal pain and whether your bowel movements are improving. Our phone number is [phone].  If you are age 53 or older, your body mass index should be between 23-30. Your Body mass index is 29.5 kg/m. If this is out of the aforementioned range listed, please consider follow up with your Primary Care Provider.  If you are age 37 or younger, your body mass index should be between 19-25. Your Body mass index is 29.5 kg/m. If this is out of the aformentioned range listed, please consider follow up with your Primary Care Provider.

## 2019-08-16 ENCOUNTER — Ambulatory Visit (INDEPENDENT_AMBULATORY_CARE_PROVIDER_SITE_OTHER): Payer: Medicare Other | Admitting: Pulmonary Disease

## 2019-08-16 ENCOUNTER — Other Ambulatory Visit: Payer: Self-pay

## 2019-08-16 ENCOUNTER — Encounter: Payer: Self-pay | Admitting: Pulmonary Disease

## 2019-08-16 VITALS — BP 124/80 | HR 81 | Temp 98.1°F | Ht 70.0 in | Wt 205.4 lb

## 2019-08-16 DIAGNOSIS — G4733 Obstructive sleep apnea (adult) (pediatric): Secondary | ICD-10-CM

## 2019-08-16 NOTE — Patient Instructions (Signed)
Obstructive sleep apnea  We will contact DME company for CPAP supplies  We will try and get a download from the machine to assess compliance and effectiveness of a machine  We will see you back in the office in about 3 months  Call with significant concerns   Sleep Apnea Sleep apnea is a condition in which breathing pauses or becomes shallow during sleep. Episodes of sleep apnea usually last 10 seconds or longer, and they may occur as many as 20 times an hour. Sleep apnea disrupts your sleep and keeps your body from getting the rest that it needs. This condition can increase your risk of certain health problems, including:  Heart attack.  Stroke.  Obesity.  Diabetes.  Heart failure.  Irregular heartbeat. What are the causes? There are three kinds of sleep apnea:  Obstructive sleep apnea. This kind is caused by a blocked or collapsed airway.  Central sleep apnea. This kind happens when the part of the brain that controls breathing does not send the correct signals to the muscles that control breathing.  Mixed sleep apnea. This is a combination of obstructive and central sleep apnea. The most common cause of this condition is a collapsed or blocked airway. An airway can collapse or become blocked if:  Your throat muscles are abnormally relaxed.  Your tongue and tonsils are larger than normal.  You are overweight.  Your airway is smaller than normal. What increases the risk? You are more likely to develop this condition if you:  Are overweight.  Smoke.  Have a smaller than normal airway.  Are elderly.  Are male.  Drink alcohol.  Take sedatives or tranquilizers.  Have a family history of sleep apnea. What are the signs or symptoms? Symptoms of this condition include:  Trouble staying asleep.  Daytime sleepiness and tiredness.  Irritability.  Loud snoring.  Morning headaches.  Trouble concentrating.  Forgetfulness.  Decreased interest in sex.   Unexplained sleepiness.  Mood swings.  Personality changes.  Feelings of depression.  Waking up often during the night to urinate.  Dry mouth.  Sore throat. How is this diagnosed? This condition may be diagnosed with:  A medical history.  A physical exam.  A series of tests that are done while you are sleeping (sleep study). These tests are usually done in a sleep lab, but they may also be done at home. How is this treated? Treatment for this condition aims to restore normal breathing and to ease symptoms during sleep. It may involve managing health issues that can affect breathing, such as high blood pressure or obesity. Treatment may include:  Sleeping on your side.  Using a decongestant if you have nasal congestion.  Avoiding the use of depressants, including alcohol, sedatives, and narcotics.  Losing weight if you are overweight.  Making changes to your diet.  Quitting smoking.  Using a device to open your airway while you sleep, such as: ? An oral appliance. This is a custom-made mouthpiece that shifts your lower jaw forward. ? A continuous positive airway pressure (CPAP) device. This device blows air through a mask when you breathe out (exhale). ? A nasal expiratory positive airway pressure (EPAP) device. This device has valves that you put into each nostril. ? A bi-level positive airway pressure (BPAP) device. This device blows air through a mask when you breathe in (inhale) and breathe out (exhale).  Having surgery if other treatments do not work. During surgery, excess tissue is removed to create a wider airway.  It is important to get treatment for sleep apnea. Without treatment, this condition can lead to:  High blood pressure.  Coronary artery disease.  In men, an inability to achieve or maintain an erection (impotence).  Reduced thinking abilities. Follow these instructions at home: Lifestyle  Make any lifestyle changes that your health care provider  recommends.  Eat a healthy, well-balanced diet.  Take steps to lose weight if you are overweight.  Avoid using depressants, including alcohol, sedatives, and narcotics.  Do not use any products that contain nicotine or tobacco, such as cigarettes, e-cigarettes, and chewing tobacco. If you need help quitting, ask your health care provider. General instructions  Take over-the-counter and prescription medicines only as told by your health care provider.  If you were given a device to open your airway while you sleep, use it only as told by your health care provider.  If you are having surgery, make sure to tell your health care provider you have sleep apnea. You may need to bring your device with you.  Keep all follow-up visits as told by your health care provider. This is important. Contact a health care provider if:  The device that you received to open your airway during sleep is uncomfortable or does not seem to be working.  Your symptoms do not improve.  Your symptoms get worse. Get help right away if:  You develop: ? Chest pain. ? Shortness of breath. ? Discomfort in your back, arms, or stomach.  You have: ? Trouble speaking. ? Weakness on one side of your body. ? Drooping in your face. These symptoms may represent a serious problem that is an emergency. Do not wait to see if the symptoms will go away. Get medical help right away. Call your local emergency services (911 in the [place] Do not drive yourself to the hospital. Summary  Sleep apnea is a condition in which breathing pauses or becomes shallow during sleep.  The most common cause is a collapsed or blocked airway.  The goal of treatment is to restore normal breathing and to ease symptoms during sleep. This information is not intended to replace advice given to you by your health care provider. Make sure you discuss any questions you have with your health care provider. Document Released: 10/03/2002 Document  Revised: 07/30/2018 Document Reviewed: 06/08/2018 Elsevier Patient Education  2020 [employer].

## 2019-08-16 NOTE — Progress Notes (Signed)
Subjective:    Patient ID: Albert Eaton, male    DOB: [birth year], 53 y.o.   MRN: [id number]  Patient with a history of mild obstructive sleep apnea has been on CPAP  Came in to establish care today He has been on CPAP since 2019 Compliant with CPAP use  He occasionally feels the pressure may be a little bit too much Usually tries to go to bed by about 10 PM, on dialysis days goes to bed between 6 and 7 PM Sometimes takes him up to 2 to 3 hours to fall asleep Wakes up frequently in the middle of the night Final awakening time about 9 AM  Multiple comorbidities History of heart disease, end-stage renal disease  He does have dryness of his mouth during the night, occasional headaches in the morning, memory has been fine  His weight is down about 20 pounds recently  He does get short of breath with activity  Past Medical History:  Diagnosis Date  . Anemia   . Aortic atherosclerosis ([place])   . Chronic diastolic CHF (congestive heart failure) ([place]) 01/06/2017   Echo 7/16 [facility] in [place], [name]) Mild AI, mild LAE, mild concentric LVH, EF 55, normal wall motion, mild to moderate MR, mild PI, RVSP 55 // Echo 10/09/16 ([facility]):  Moderate LVH, grade 2 diastolic dysfunction, mild MR, moderate LAE   . CKD (chronic kidney disease)    per pt, waiting on a kidney transplant in near future.  . Esophagitis   . Gout   . Hepatitis    Hep B  . History of nuclear stress test    a. Nuc study 7/16: no scar or ischemia, EF 42 // b. Nuc study 12/17: EF 48, ?small apical ischemia, Low Risk  . HIV (human immunodeficiency virus infection) ([place])   . HLD (hyperlipidemia)   . Hypertension   . Hypertensive heart disease with CHF (congestive heart failure) ([place]) 10/09/2016  . Mitral regurgitation   . OSA (obstructive sleep apnea)   . Post-operative nausea and vomiting    1 time as a child  . Sleep apnea    uses c-pap  . Wears glasses   . Wears partial dentures    Family History  Problem  Relation Age of Onset  . Heart failure Mother   . Heart attack Maternal Grandmother 53  . Hypertension Sister   . Multiple sclerosis Sister   . Hypertension Brother   . Hypertension Sister   . Scoliosis Other   . Allergic rhinitis Neg Hx   . Angioedema Neg Hx   . Asthma Neg Hx   . Eczema Neg Hx   . Immunodeficiency Neg Hx   . Urticaria Neg Hx    Social History   Socioeconomic History  . Marital status: Single    Spouse name: Not on file  . Number of children: Not on file  . Years of education: Not on file  . Highest education level: Not on file  Occupational History  . Not on file  Social Needs  . Financial resource strain: Not on file  . Food insecurity    Worry: Not on file    Inability: Not on file  . Transportation needs    Medical: Not on file    Non-medical: Not on file  Tobacco Use  . Smoking status: Former Smoker    Types: Cigarettes    Quit date: 2000    Years since quitting: 20.8  . Smokeless tobacco: Never Used  Substance  and Sexual Activity  . Alcohol use: Not Currently    Alcohol/week: 2.0 standard drinks    Types: 2 Shots of liquor per week  . Drug use: No  . Sexual activity: Never    Partners: Male    Birth control/protection: Condom    Comment: declined condoms  Lifestyle  . Physical activity    Days per week: Not on file    Minutes per session: Not on file  . Stress: Not on file  Relationships  . Social Herbalist on phone: Not on file    Gets together: Not on file    Attends religious service: Not on file    Active member of club or organization: Not on file    Attends meetings of clubs or organizations: Not on file    Relationship status: Not on file  . Intimate partner violence    Fear of current or ex partner: Not on file    Emotionally abused: Not on file    Physically abused: Not on file    Forced sexual activity: Not on file  Other Topics Concern  . Not on file  Social History Narrative  . Not on file        Review of Systems  Constitutional: Negative.   HENT: Positive for congestion and sneezing.   Eyes: Negative.   Respiratory: Positive for shortness of breath.   Cardiovascular: Negative.   Gastrointestinal: Negative.   Genitourinary: Negative.        Objective:   Physical Exam Constitutional:      Appearance: Normal appearance.  HENT:     Head: Normocephalic and atraumatic.     Nose: No congestion.     Mouth/Throat:     Mouth: Mucous membranes are moist.  Eyes:     General:        Right eye: No discharge.        Left eye: No discharge.     Pupils: Pupils are equal, round, and reactive to light.  Neck:     Musculoskeletal: Normal range of motion.  Cardiovascular:     Pulses: Normal pulses.     Heart sounds: Normal heart sounds. No murmur. No friction rub.  Pulmonary:     Effort: Pulmonary effort is normal. No respiratory distress.     Breath sounds: Normal breath sounds. No stridor. No wheezing or rhonchi.  Abdominal:     General: There is no distension.  Musculoskeletal: Normal range of motion.  Skin:    General: Skin is warm and dry.     Coloration: Skin is not jaundiced.  Neurological:     General: No focal deficit present.     Mental Status: He is alert.  Psychiatric:        Mood and Affect: Mood normal.        Behavior: Behavior normal.   Polysomnogram from 12/24/2017 reviewed revealing mild obstructive sleep apnea    Assessment & Plan:  .  Mild obstructive sleep apnea -Has been compliant with CPAP use  .  Sleep onset insomnia -Continue to monitor closely  Plan: Contact DME company to try and get a download from his CPAP  We will follow-up in 3 months  Encouraged to call with any significant concerns

## 2019-08-17 ENCOUNTER — Institutional Professional Consult (permissible substitution): Payer: Medicare Other | Admitting: Pulmonary Disease

## 2019-09-02 ENCOUNTER — Other Ambulatory Visit: Payer: Self-pay | Admitting: Infectious Diseases

## 2019-09-02 ENCOUNTER — Other Ambulatory Visit: Payer: Self-pay

## 2019-09-02 ENCOUNTER — Other Ambulatory Visit: Payer: Self-pay | Admitting: Family Medicine

## 2019-09-02 DIAGNOSIS — B20 Human immunodeficiency virus [HIV] disease: Secondary | ICD-10-CM

## 2019-09-02 DIAGNOSIS — E039 Hypothyroidism, unspecified: Secondary | ICD-10-CM

## 2019-09-02 DIAGNOSIS — M109 Gout, unspecified: Secondary | ICD-10-CM

## 2019-09-02 MED ORDER — LAMIVUDINE 100 MG PO TABS: ORAL_TABLET | ORAL | 3 refills | Status: DC

## 2019-09-02 MED ORDER — ISENTRESS 400 MG PO TABS: 400.0000 mg | ORAL_TABLET | Freq: Two times a day (BID) | ORAL | 3 refills | Status: DC

## 2019-09-02 MED ORDER — ABACAVIR SULFATE 300 MG PO TABS: 300.0000 mg | ORAL_TABLET | Freq: Every day | ORAL | 3 refills | Status: DC

## 2019-09-06 ENCOUNTER — Other Ambulatory Visit: Payer: Self-pay | Admitting: Family Medicine

## 2019-09-06 ENCOUNTER — Telehealth: Payer: Self-pay | Admitting: Internal Medicine

## 2019-09-06 MED ORDER — LUBIPROSTONE 24 MCG PO CAPS: 24.0000 ug | ORAL_CAPSULE | Freq: Two times a day (BID) | ORAL | 3 refills | Status: DC

## 2019-09-06 NOTE — Telephone Encounter (Signed)
Pt states Dr. Hilarie Fredrickson wanted him to call back with an update in a month. Pt reports he is not doing better, states he is still having lower stomach pain. States he is waking up with more lower abd pain and he is having more gas. Reports he is only having a BM every 3-4 days. Please advise.

## 2019-09-06 NOTE — Telephone Encounter (Signed)
Spoke with pt and he is aware, script sent to pharmacy.

## 2019-09-06 NOTE — Telephone Encounter (Signed)
I would recommend we increase Amitiza 24 mcg twice daily, best taken with food Tell him to give this a try for 2 weeks and then give me one more update If symptoms persist I would like to repeat cross-sectional imaging

## 2019-09-29 ENCOUNTER — Telehealth: Payer: Self-pay

## 2019-09-29 NOTE — Telephone Encounter (Signed)
COVID-19 Pre-Screening Questions:  Do you currently have a fever (>100 F), chills or unexplained body aches? NO   Are you currently experiencing new cough, shortness of breath, sore throat, runny nose?NO .  Have you recently travelled outside the state of [place] in the last 14 days? NO .  Have you been in contact with someone that is currently pending confirmation of Covid19 testing or has been confirmed to have the Covid19 virus?  NO  **If the patient answers NO to ALL questions -  advise the patient to please call the clinic before coming to the office should any symptoms develop.     

## 2019-09-30 ENCOUNTER — Other Ambulatory Visit: Payer: Medicaid Other

## 2019-10-06 ENCOUNTER — Other Ambulatory Visit (HOSPITAL_COMMUNITY)
Admission: RE | Admit: 2019-10-06 | Discharge: 2019-10-06 | Disposition: A | Discharge status: Home / Self Care | Payer: Medicare Other | Source: Ambulatory Visit | Attending: Infectious Diseases | Admitting: Infectious Diseases

## 2019-10-06 ENCOUNTER — Other Ambulatory Visit: Payer: Medicare Other

## 2019-10-06 ENCOUNTER — Other Ambulatory Visit: Payer: Self-pay

## 2019-10-06 DIAGNOSIS — N184 Chronic kidney disease, stage 4 (severe): Secondary | ICD-10-CM

## 2019-10-06 DIAGNOSIS — Z113 Encounter for screening for infections with a predominantly sexual mode of transmission: Secondary | ICD-10-CM | POA: Diagnosis present

## 2019-10-06 DIAGNOSIS — B20 Human immunodeficiency virus [HIV] disease: Secondary | ICD-10-CM

## 2019-10-06 DIAGNOSIS — Z79899 Other long term (current) drug therapy: Secondary | ICD-10-CM

## 2019-10-06 NOTE — Addendum Note (Signed)
Addended by: Dolan Amen D on: 10/06/2019 09:00 AM   Modules accepted: Orders

## 2019-10-07 LAB — URINE CYTOLOGY ANCILLARY ONLY
Chlamydia: NEGATIVE
Comment: NEGATIVE
Comment: NORMAL
Neisseria Gonorrhea: NEGATIVE

## 2019-10-07 LAB — T-HELPER CELL (CD4) - (RCID CLINIC ONLY)
CD4 % Helper T Cell: 47 % (ref 33–65)
CD4 T Cell Abs: 916 /uL (ref 400–1790)

## 2019-10-08 ENCOUNTER — Other Ambulatory Visit: Payer: Self-pay | Admitting: Family Medicine

## 2019-10-11 ENCOUNTER — Telehealth: Payer: Self-pay | Admitting: Family Medicine

## 2019-10-11 NOTE — Telephone Encounter (Signed)
Called patient he states he got a call from out side company for a back brace and a knee brace. I advised him that we did not order these braces for him.

## 2019-10-13 ENCOUNTER — Other Ambulatory Visit: Payer: Self-pay

## 2019-10-13 ENCOUNTER — Ambulatory Visit (INDEPENDENT_AMBULATORY_CARE_PROVIDER_SITE_OTHER): Payer: Medicare Other | Admitting: Infectious Diseases

## 2019-10-13 ENCOUNTER — Encounter: Payer: Self-pay | Admitting: Infectious Diseases

## 2019-10-13 VITALS — BP 144/86 | HR 69 | Wt 203.3 lb

## 2019-10-13 DIAGNOSIS — Z113 Encounter for screening for infections with a predominantly sexual mode of transmission: Secondary | ICD-10-CM | POA: Diagnosis not present

## 2019-10-13 DIAGNOSIS — I5032 Chronic diastolic (congestive) heart failure: Secondary | ICD-10-CM

## 2019-10-13 DIAGNOSIS — B20 Human immunodeficiency virus [HIV] disease: Secondary | ICD-10-CM

## 2019-10-13 DIAGNOSIS — Z23 Encounter for immunization: Secondary | ICD-10-CM | POA: Diagnosis present

## 2019-10-13 DIAGNOSIS — N184 Chronic kidney disease, stage 4 (severe): Secondary | ICD-10-CM

## 2019-10-13 DIAGNOSIS — Z79899 Other long term (current) drug therapy: Secondary | ICD-10-CM

## 2019-10-13 NOTE — Progress Notes (Signed)
   Subjective:    Patient ID: Albert Eaton, male    DOB: Apr 30, 1966, 53 y.o.   MRN: [id number]  HPI 53y.o.Mwith a history of CHF (nonischemic non-dilated CMwith NYHA 2/C HF preserved EF 55% with moderate concentric hypertrophy and a grade 2 diastolic dysfunction. Moderate mitral valve insufficiency), CKD V (started HD 05-13-19), HTN, HLD, OSA, neuropathy,and HIV(dx 1991). He had kidney bx 9-18showing focal nephrosclerosis, interstitial fibrosis.  Taking Isentress, Epivir, abacavir.  Has been feeling well.  Was referred to GI for "stomach issues". Has been on rx which has not helped (tysmio?).  Still having issues with his R ear. Was considered to be from his torsemide, was stopped but ear issues persisted.   HIV 1 RNA Quant (copies/mL)  Date Value  11/22/2018 <20 NOT DETECTED  02/17/2018 <20 NOT DETECTED  08/17/2017 <20 NOT DETECTED   CD4 T Cell Abs (/uL)  Date Value  10/06/2019 916  11/22/2018 740  02/17/2018 670    Review of Systems  Constitutional: Positive for chills. Negative for appetite change, fever and unexpected weight change.  Respiratory: Negative for cough and shortness of breath.   Gastrointestinal: Positive for constipation (water restriction). Negative for diarrhea.  Genitourinary: Negative for difficulty urinating.  Psychiatric/Behavioral: Positive for sleep disturbance.  no problems with his HD fistula. Please see HPI. All other systems reviewed and negative.      Objective:   Physical Exam Constitutional:      Appearance: Normal appearance.  HENT:     Mouth/Throat:     Mouth: Mucous membranes are moist.     Pharynx: No oropharyngeal exudate.  Eyes:     Extraocular Movements: Extraocular movements intact.     Pupils: Pupils are equal, round, and reactive to light.  Cardiovascular:     Rate and Rhythm: Normal rate and regular rhythm.  Pulmonary:     Effort: Pulmonary effort is normal.     Breath sounds: Normal breath sounds.  Abdominal:     General: Bowel sounds are normal. There is no distension.     Palpations: Abdomen is soft.     Tenderness: There is no abdominal tenderness.  Musculoskeletal:     Cervical back: Normal range of motion and neck supple.     Right lower leg: No edema.     Left lower leg: No edema.  Neurological:     General: No focal deficit present.     Mental Status: He is alert.  Psychiatric:        Mood and Affect: Mood normal.   LUE fistula- clean, +bruit.         Assessment & Plan:

## 2019-10-13 NOTE — Assessment & Plan Note (Addendum)
On HD since July.  Doing well  On txp list at [facility].

## 2019-10-13 NOTE — Addendum Note (Signed)
Addended by: Carlean Purl on: 10/13/2019 04:25 PM   Modules accepted: Orders

## 2019-10-13 NOTE — Assessment & Plan Note (Signed)
He is doing well on renal sparing rx No HIV RNA with this lab visit, his CD4 is stable, will check at next visit PCV 23 today Has gotten flu vax.  Had colon last year.  Offered/refused condoms.  rtc in 9 months

## 2019-10-13 NOTE — Assessment & Plan Note (Signed)
Has been doing well.  Sees Dr [name] here, greatly appreciate his partnering with [place].  He states he needs f/u.

## 2019-10-14 ENCOUNTER — Encounter: Payer: Medicaid Other | Admitting: Infectious Diseases

## 2019-10-15 LAB — COMPREHENSIVE METABOLIC PANEL
AG Ratio: 1.6 (calc) (ref 1.0–2.5)
ALT: 13 U/L (ref 9–46)
AST: 12 U/L (ref 10–35)
Albumin: 4.4 g/dL (ref 3.6–5.1)
Alkaline phosphatase (APISO): 79 U/L (ref 35–144)
BUN/Creatinine Ratio: 6 (calc) (ref 6–22)
BUN: 24 mg/dL (ref 7–25)
CO2: 30 mmol/L (ref 20–32)
Calcium: 10.1 mg/dL (ref 8.6–10.3)
Chloride: 99 mmol/L (ref 98–110)
Creat: 3.9 mg/dL — ABNORMAL HIGH (ref 0.70–1.33)
Globulin: 2.8 g/dL (calc) (ref 1.9–3.7)
Glucose, Bld: 94 mg/dL (ref 65–99)
Potassium: 3.6 mmol/L (ref 3.5–5.3)
Sodium: 140 mmol/L (ref 135–146)
Total Bilirubin: 0.4 mg/dL (ref 0.2–1.2)
Total Protein: 7.2 g/dL (ref 6.1–8.1)

## 2019-10-15 LAB — CBC
HCT: 32.8 % — ABNORMAL LOW (ref 38.5–50.0)
Hemoglobin: 11.4 g/dL — ABNORMAL LOW (ref 13.2–17.1)
MCH: 33 pg (ref 27.0–33.0)
MCHC: 34.8 g/dL (ref 32.0–36.0)
MCV: 95.1 fL (ref 80.0–100.0)
MPV: 10.7 fL (ref 7.5–12.5)
Platelets: 213 10*3/uL (ref 140–400)
RBC: 3.45 10*6/uL — ABNORMAL LOW (ref 4.20–5.80)
RDW: 13.4 % (ref 11.0–15.0)
WBC: 3.9 10*3/uL (ref 3.8–10.8)

## 2019-10-15 LAB — RPR: RPR Ser Ql: NONREACTIVE

## 2019-10-15 LAB — LIPID PANEL
Cholesterol: 159 mg/dL (ref <200)
HDL: 35 mg/dL — ABNORMAL LOW (ref >=40)
LDL Cholesterol (Calc): 103 mg/dL (calc) — ABNORMAL HIGH
Non-HDL Cholesterol (Calc): 124 mg/dL (calc) (ref <130)
Total CHOL/HDL Ratio: 4.5 (calc) (ref <5.0)
Triglycerides: 117 mg/dL (ref <150)

## 2019-10-15 LAB — HIV-1 RNA QUANT-NO REFLEX-BLD
HIV 1 RNA Quant: <20 copies/mL
HIV-1 RNA Quant, Log: <1.3 Log copies/mL

## 2019-10-29 DIAGNOSIS — E876 Hypokalemia: Secondary | ICD-10-CM | POA: Diagnosis not present

## 2019-10-29 DIAGNOSIS — N186 End stage renal disease: Secondary | ICD-10-CM | POA: Diagnosis not present

## 2019-10-29 DIAGNOSIS — R6883 Chills (without fever): Secondary | ICD-10-CM | POA: Diagnosis not present

## 2019-10-29 DIAGNOSIS — D631 Anemia in chronic kidney disease: Secondary | ICD-10-CM | POA: Diagnosis not present

## 2019-10-29 DIAGNOSIS — D689 Coagulation defect, unspecified: Secondary | ICD-10-CM | POA: Diagnosis not present

## 2019-10-29 DIAGNOSIS — R5383 Other fatigue: Secondary | ICD-10-CM | POA: Diagnosis not present

## 2019-10-29 DIAGNOSIS — N2581 Secondary hyperparathyroidism of renal origin: Secondary | ICD-10-CM | POA: Diagnosis not present

## 2019-10-29 DIAGNOSIS — Z992 Dependence on renal dialysis: Secondary | ICD-10-CM | POA: Diagnosis not present

## 2019-10-31 DIAGNOSIS — E876 Hypokalemia: Secondary | ICD-10-CM | POA: Diagnosis not present

## 2019-10-31 DIAGNOSIS — D631 Anemia in chronic kidney disease: Secondary | ICD-10-CM | POA: Diagnosis not present

## 2019-10-31 DIAGNOSIS — D689 Coagulation defect, unspecified: Secondary | ICD-10-CM | POA: Diagnosis not present

## 2019-10-31 DIAGNOSIS — Z992 Dependence on renal dialysis: Secondary | ICD-10-CM | POA: Diagnosis not present

## 2019-10-31 DIAGNOSIS — N2581 Secondary hyperparathyroidism of renal origin: Secondary | ICD-10-CM | POA: Diagnosis not present

## 2019-10-31 DIAGNOSIS — R5383 Other fatigue: Secondary | ICD-10-CM | POA: Diagnosis not present

## 2019-10-31 DIAGNOSIS — N186 End stage renal disease: Secondary | ICD-10-CM | POA: Diagnosis not present

## 2019-10-31 DIAGNOSIS — R6883 Chills (without fever): Secondary | ICD-10-CM | POA: Diagnosis not present

## 2019-11-01 ENCOUNTER — Other Ambulatory Visit: Payer: Self-pay | Admitting: Cardiovascular Disease

## 2019-11-02 DIAGNOSIS — Z992 Dependence on renal dialysis: Secondary | ICD-10-CM | POA: Diagnosis not present

## 2019-11-02 DIAGNOSIS — R6883 Chills (without fever): Secondary | ICD-10-CM | POA: Diagnosis not present

## 2019-11-02 DIAGNOSIS — N2581 Secondary hyperparathyroidism of renal origin: Secondary | ICD-10-CM | POA: Diagnosis not present

## 2019-11-02 DIAGNOSIS — R5383 Other fatigue: Secondary | ICD-10-CM | POA: Diagnosis not present

## 2019-11-02 DIAGNOSIS — D689 Coagulation defect, unspecified: Secondary | ICD-10-CM | POA: Diagnosis not present

## 2019-11-02 DIAGNOSIS — N186 End stage renal disease: Secondary | ICD-10-CM | POA: Diagnosis not present

## 2019-11-02 DIAGNOSIS — E876 Hypokalemia: Secondary | ICD-10-CM | POA: Diagnosis not present

## 2019-11-02 DIAGNOSIS — D631 Anemia in chronic kidney disease: Secondary | ICD-10-CM | POA: Diagnosis not present

## 2019-11-04 DIAGNOSIS — E876 Hypokalemia: Secondary | ICD-10-CM | POA: Diagnosis not present

## 2019-11-04 DIAGNOSIS — R6883 Chills (without fever): Secondary | ICD-10-CM | POA: Diagnosis not present

## 2019-11-04 DIAGNOSIS — R5383 Other fatigue: Secondary | ICD-10-CM | POA: Diagnosis not present

## 2019-11-04 DIAGNOSIS — N186 End stage renal disease: Secondary | ICD-10-CM | POA: Diagnosis not present

## 2019-11-04 DIAGNOSIS — Z992 Dependence on renal dialysis: Secondary | ICD-10-CM | POA: Diagnosis not present

## 2019-11-04 DIAGNOSIS — D631 Anemia in chronic kidney disease: Secondary | ICD-10-CM | POA: Diagnosis not present

## 2019-11-04 DIAGNOSIS — N2581 Secondary hyperparathyroidism of renal origin: Secondary | ICD-10-CM | POA: Diagnosis not present

## 2019-11-04 DIAGNOSIS — D689 Coagulation defect, unspecified: Secondary | ICD-10-CM | POA: Diagnosis not present

## 2019-11-07 DIAGNOSIS — R6883 Chills (without fever): Secondary | ICD-10-CM | POA: Insufficient documentation

## 2019-11-07 DIAGNOSIS — D631 Anemia in chronic kidney disease: Secondary | ICD-10-CM | POA: Diagnosis not present

## 2019-11-07 DIAGNOSIS — R5383 Other fatigue: Secondary | ICD-10-CM | POA: Diagnosis not present

## 2019-11-07 DIAGNOSIS — Z992 Dependence on renal dialysis: Secondary | ICD-10-CM | POA: Diagnosis not present

## 2019-11-07 DIAGNOSIS — N2581 Secondary hyperparathyroidism of renal origin: Secondary | ICD-10-CM | POA: Diagnosis not present

## 2019-11-07 DIAGNOSIS — R509 Fever, unspecified: Secondary | ICD-10-CM | POA: Insufficient documentation

## 2019-11-07 DIAGNOSIS — D689 Coagulation defect, unspecified: Secondary | ICD-10-CM | POA: Diagnosis not present

## 2019-11-07 DIAGNOSIS — N186 End stage renal disease: Secondary | ICD-10-CM | POA: Diagnosis not present

## 2019-11-07 DIAGNOSIS — E876 Hypokalemia: Secondary | ICD-10-CM | POA: Diagnosis not present

## 2019-11-09 DIAGNOSIS — N186 End stage renal disease: Secondary | ICD-10-CM | POA: Diagnosis not present

## 2019-11-09 DIAGNOSIS — Z992 Dependence on renal dialysis: Secondary | ICD-10-CM | POA: Diagnosis not present

## 2019-11-09 DIAGNOSIS — R6883 Chills (without fever): Secondary | ICD-10-CM | POA: Diagnosis not present

## 2019-11-09 DIAGNOSIS — E876 Hypokalemia: Secondary | ICD-10-CM | POA: Diagnosis not present

## 2019-11-09 DIAGNOSIS — N2581 Secondary hyperparathyroidism of renal origin: Secondary | ICD-10-CM | POA: Diagnosis not present

## 2019-11-09 DIAGNOSIS — D631 Anemia in chronic kidney disease: Secondary | ICD-10-CM | POA: Diagnosis not present

## 2019-11-09 DIAGNOSIS — R5383 Other fatigue: Secondary | ICD-10-CM | POA: Diagnosis not present

## 2019-11-09 DIAGNOSIS — D689 Coagulation defect, unspecified: Secondary | ICD-10-CM | POA: Diagnosis not present

## 2019-11-11 DIAGNOSIS — D689 Coagulation defect, unspecified: Secondary | ICD-10-CM | POA: Diagnosis not present

## 2019-11-11 DIAGNOSIS — E876 Hypokalemia: Secondary | ICD-10-CM | POA: Diagnosis not present

## 2019-11-11 DIAGNOSIS — N186 End stage renal disease: Secondary | ICD-10-CM | POA: Diagnosis not present

## 2019-11-11 DIAGNOSIS — R6883 Chills (without fever): Secondary | ICD-10-CM | POA: Diagnosis not present

## 2019-11-11 DIAGNOSIS — D631 Anemia in chronic kidney disease: Secondary | ICD-10-CM | POA: Diagnosis not present

## 2019-11-11 DIAGNOSIS — N2581 Secondary hyperparathyroidism of renal origin: Secondary | ICD-10-CM | POA: Diagnosis not present

## 2019-11-11 DIAGNOSIS — Z992 Dependence on renal dialysis: Secondary | ICD-10-CM | POA: Diagnosis not present

## 2019-11-11 DIAGNOSIS — R5383 Other fatigue: Secondary | ICD-10-CM | POA: Diagnosis not present

## 2019-11-14 DIAGNOSIS — R5383 Other fatigue: Secondary | ICD-10-CM | POA: Diagnosis not present

## 2019-11-14 DIAGNOSIS — Z992 Dependence on renal dialysis: Secondary | ICD-10-CM | POA: Diagnosis not present

## 2019-11-14 DIAGNOSIS — E876 Hypokalemia: Secondary | ICD-10-CM | POA: Diagnosis not present

## 2019-11-14 DIAGNOSIS — D631 Anemia in chronic kidney disease: Secondary | ICD-10-CM | POA: Diagnosis not present

## 2019-11-14 DIAGNOSIS — N2581 Secondary hyperparathyroidism of renal origin: Secondary | ICD-10-CM | POA: Diagnosis not present

## 2019-11-14 DIAGNOSIS — N186 End stage renal disease: Secondary | ICD-10-CM | POA: Diagnosis not present

## 2019-11-14 DIAGNOSIS — R6883 Chills (without fever): Secondary | ICD-10-CM | POA: Diagnosis not present

## 2019-11-14 DIAGNOSIS — D689 Coagulation defect, unspecified: Secondary | ICD-10-CM | POA: Diagnosis not present

## 2019-11-16 DIAGNOSIS — R6883 Chills (without fever): Secondary | ICD-10-CM | POA: Diagnosis not present

## 2019-11-16 DIAGNOSIS — D631 Anemia in chronic kidney disease: Secondary | ICD-10-CM | POA: Diagnosis not present

## 2019-11-16 DIAGNOSIS — Z992 Dependence on renal dialysis: Secondary | ICD-10-CM | POA: Diagnosis not present

## 2019-11-16 DIAGNOSIS — E876 Hypokalemia: Secondary | ICD-10-CM | POA: Diagnosis not present

## 2019-11-16 DIAGNOSIS — N186 End stage renal disease: Secondary | ICD-10-CM | POA: Diagnosis not present

## 2019-11-16 DIAGNOSIS — R5383 Other fatigue: Secondary | ICD-10-CM | POA: Diagnosis not present

## 2019-11-16 DIAGNOSIS — N2581 Secondary hyperparathyroidism of renal origin: Secondary | ICD-10-CM | POA: Diagnosis not present

## 2019-11-16 DIAGNOSIS — D689 Coagulation defect, unspecified: Secondary | ICD-10-CM | POA: Diagnosis not present

## 2019-11-17 ENCOUNTER — Ambulatory Visit (INDEPENDENT_AMBULATORY_CARE_PROVIDER_SITE_OTHER): Payer: Medicare Other | Admitting: Pulmonary Disease

## 2019-11-17 ENCOUNTER — Other Ambulatory Visit: Payer: Self-pay

## 2019-11-17 ENCOUNTER — Encounter: Payer: Self-pay | Admitting: Pulmonary Disease

## 2019-11-17 VITALS — BP 120/80 | HR 73 | Temp 97.9°F | Ht 70.0 in | Wt 207.8 lb

## 2019-11-17 DIAGNOSIS — G4733 Obstructive sleep apnea (adult) (pediatric): Secondary | ICD-10-CM | POA: Diagnosis not present

## 2019-11-17 DIAGNOSIS — F5104 Psychophysiologic insomnia: Secondary | ICD-10-CM

## 2019-11-17 NOTE — Patient Instructions (Signed)
Obstructive sleep apnea Adequately controlled with CPAP use  Insomnia -We can try a mild sleep aid if okay with you at any time  Call with any significant concerns  I will see you back in 6 months

## 2019-11-17 NOTE — Progress Notes (Signed)
Subjective:    Patient ID: Albert Eaton, male    DOB: 1966-10-20, 54 y.o.   MRN: [id number]  Patient with a history of mild obstructive sleep apnea has been on CPAP  Continues to be very compliant with CPAP use Has no difficulty with CPAP use Occasionally does have mask leaks  Compliant with use, has been on CPAP since 2019  He occasionally feels the pressure may be a little bit too much Usually tries to go to bed by about 10 PM, on dialysis days goes to bed between 6 and 7 PM Sometimes takes him up to 2 to 3 hours to fall asleep Wakes up frequently in the middle of the night Final awakening time about 9 AM  Multiple comorbidities History of heart disease, end-stage renal disease  He does have dryness of his mouth during the night, occasional headaches in the morning, memory has been fine  His weight is down about 20 pounds recently  He does get short of breath with activity  He does have sleep onset and sleep maintenance insomnia, but-feels is on a lot of medications right now and does not want to add any other things to it  Past Medical History:  Diagnosis Date  . Anemia   . Aortic atherosclerosis ([place])   . Chronic diastolic CHF (congestive heart failure) ([place]) 01/06/2017   Echo 7/16 [facility] in [place], [name]) Mild AI, mild LAE, mild concentric LVH, EF 55, normal wall motion, mild to moderate MR, mild PI, RVSP 55 // Echo 10/09/16 ([facility]):  Moderate LVH, grade 2 diastolic dysfunction, mild MR, moderate LAE   . CKD (chronic kidney disease)    per pt, waiting on a kidney transplant in near future.  . Esophagitis   . Gout   . Hepatitis    Hep B  . History of nuclear stress test    a. Nuc study 7/16: no scar or ischemia, EF 42 // b. Nuc study 12/17: EF 48, ?small apical ischemia, Low Risk  . HIV (human immunodeficiency virus infection) ([place])   . HLD (hyperlipidemia)   . Hypertension   . Hypertensive heart disease with CHF (congestive heart failure) ([place]) 10/09/2016   . Mitral regurgitation   . OSA (obstructive sleep apnea)   . Post-operative nausea and vomiting    1 time as a child  . Sleep apnea    uses c-pap  . Wears glasses   . Wears partial dentures    Family History  Problem Relation Age of Onset  . Heart failure Mother   . Heart attack Maternal Grandmother 53  . Hypertension Sister   . Multiple sclerosis Sister   . Hypertension Brother   . Hypertension Sister   . Scoliosis Other   . Allergic rhinitis Neg Hx   . Angioedema Neg Hx   . Asthma Neg Hx   . Eczema Neg Hx   . Immunodeficiency Neg Hx   . Urticaria Neg Hx    Social History   Socioeconomic History  . Marital status: Single    Spouse name: Not on file  . Number of children: Not on file  . Years of education: Not on file  . Highest education level: Not on file  Occupational History  . Not on file  Tobacco Use  . Smoking status: Former Smoker    Types: Cigarettes    Quit date: 2000    Years since quitting: 21.0  . Smokeless tobacco: Never Used  Substance and Sexual Activity  .  Alcohol use: Not Currently    Alcohol/week: 2.0 standard drinks    Types: 2 Shots of liquor per week  . Drug use: No  . Sexual activity: Never    Partners: Male    Birth control/protection: Condom    Comment: declined condoms  Other Topics Concern  . Not on file  Social History Narrative  . Not on file   Social Determinants of Health   Financial Resource Strain:   . Difficulty of Paying Living Expenses: Not on file  Food Insecurity:   . Worried About Charity fundraiser in the Last Year: Not on file  . Ran Out of Food in the Last Year: Not on file  Transportation Needs:   . Lack of Transportation (Medical): Not on file  . Lack of Transportation (Non-Medical): Not on file  Physical Activity:   . Days of Exercise per Week: Not on file  . Minutes of Exercise per Session: Not on file  Stress:   . Feeling of Stress : Not on file  Social Connections:   . Frequency of Communication  with Friends and Family: Not on file  . Frequency of Social Gatherings with Friends and Family: Not on file  . Attends Religious Services: Not on file  . Active Member of Clubs or Organizations: Not on file  . Attends Archivist Meetings: Not on file  . Marital Status: Not on file  Intimate Partner Violence:   . Fear of Current or Ex-Partner: Not on file  . Emotionally Abused: Not on file  . Physically Abused: Not on file  . Sexually Abused: Not on file       Review of Systems  Constitutional: Negative.   HENT: Positive for congestion and sneezing.   Eyes: Negative.   Respiratory: Positive for apnea.   Cardiovascular: Negative.   Gastrointestinal: Negative.   Genitourinary: Negative.   Psychiatric/Behavioral: Positive for sleep disturbance.  All other systems reviewed and are negative.      Objective:   Physical Exam Constitutional:      Appearance: Normal appearance.  HENT:     Head: Normocephalic and atraumatic.     Nose: No congestion.     Mouth/Throat:     Mouth: Mucous membranes are moist.  Eyes:     General:        Right eye: No discharge.        Left eye: No discharge.     Pupils: Pupils are equal, round, and reactive to light.  Cardiovascular:     Pulses: Normal pulses.     Heart sounds: Normal heart sounds. No murmur. No friction rub.  Pulmonary:     Effort: Pulmonary effort is normal. No respiratory distress.     Breath sounds: Normal breath sounds. No stridor. No wheezing or rhonchi.  Musculoskeletal:     Cervical back: Normal range of motion.  Neurological:     Mental Status: He is alert.   Polysomnogram from 12/24/2017 reviewed revealing mild obstructive sleep apnea  Compliance data reviewed showing 100% compliance Setting 5-20 Maximum pressure of 12.9 AHI of 3.9    Assessment & Plan:  .  Mild obstructive sleep apnea -Continues to be compliant with CPAP use -Encouraged to continue CPAP use on a regular basis  .  Sleep onset  insomnia -We will continue to monitor -No changes at present  Plan: Continue CPAP Regular exercises Encouraged to call with concerns  Follow complain of occasional mask leak, if this continues to be concerning  pressure limits may be made to the machine-change pressure from 5-20 to 5-12  We will follow-up in 6 months  Encouraged to call with any significant concerns

## 2019-11-18 DIAGNOSIS — Z992 Dependence on renal dialysis: Secondary | ICD-10-CM | POA: Diagnosis not present

## 2019-11-18 DIAGNOSIS — N2581 Secondary hyperparathyroidism of renal origin: Secondary | ICD-10-CM | POA: Diagnosis not present

## 2019-11-18 DIAGNOSIS — E876 Hypokalemia: Secondary | ICD-10-CM | POA: Diagnosis not present

## 2019-11-18 DIAGNOSIS — R6883 Chills (without fever): Secondary | ICD-10-CM | POA: Diagnosis not present

## 2019-11-18 DIAGNOSIS — D689 Coagulation defect, unspecified: Secondary | ICD-10-CM | POA: Diagnosis not present

## 2019-11-18 DIAGNOSIS — N186 End stage renal disease: Secondary | ICD-10-CM | POA: Diagnosis not present

## 2019-11-18 DIAGNOSIS — R5383 Other fatigue: Secondary | ICD-10-CM | POA: Diagnosis not present

## 2019-11-18 DIAGNOSIS — D631 Anemia in chronic kidney disease: Secondary | ICD-10-CM | POA: Diagnosis not present

## 2019-11-21 ENCOUNTER — Other Ambulatory Visit: Payer: Self-pay

## 2019-11-21 ENCOUNTER — Ambulatory Visit (INDEPENDENT_AMBULATORY_CARE_PROVIDER_SITE_OTHER): Payer: Medicare Other | Admitting: Allergy and Immunology

## 2019-11-21 ENCOUNTER — Encounter: Payer: Self-pay | Admitting: Allergy and Immunology

## 2019-11-21 DIAGNOSIS — J3089 Other allergic rhinitis: Secondary | ICD-10-CM

## 2019-11-21 DIAGNOSIS — J4520 Mild intermittent asthma, uncomplicated: Secondary | ICD-10-CM | POA: Diagnosis not present

## 2019-11-21 DIAGNOSIS — N2581 Secondary hyperparathyroidism of renal origin: Secondary | ICD-10-CM | POA: Diagnosis not present

## 2019-11-21 DIAGNOSIS — I5031 Acute diastolic (congestive) heart failure: Secondary | ICD-10-CM | POA: Insufficient documentation

## 2019-11-21 DIAGNOSIS — Z992 Dependence on renal dialysis: Secondary | ICD-10-CM | POA: Diagnosis not present

## 2019-11-21 DIAGNOSIS — H93A1 Pulsatile tinnitus, right ear: Secondary | ICD-10-CM | POA: Insufficient documentation

## 2019-11-21 DIAGNOSIS — H1013 Acute atopic conjunctivitis, bilateral: Secondary | ICD-10-CM

## 2019-11-21 DIAGNOSIS — I509 Heart failure, unspecified: Secondary | ICD-10-CM | POA: Insufficient documentation

## 2019-11-21 DIAGNOSIS — R5383 Other fatigue: Secondary | ICD-10-CM | POA: Diagnosis not present

## 2019-11-21 DIAGNOSIS — D631 Anemia in chronic kidney disease: Secondary | ICD-10-CM | POA: Diagnosis not present

## 2019-11-21 DIAGNOSIS — E876 Hypokalemia: Secondary | ICD-10-CM | POA: Diagnosis not present

## 2019-11-21 DIAGNOSIS — D689 Coagulation defect, unspecified: Secondary | ICD-10-CM | POA: Diagnosis not present

## 2019-11-21 DIAGNOSIS — N19 Unspecified kidney failure: Secondary | ICD-10-CM | POA: Insufficient documentation

## 2019-11-21 DIAGNOSIS — N186 End stage renal disease: Secondary | ICD-10-CM | POA: Diagnosis not present

## 2019-11-21 DIAGNOSIS — R6883 Chills (without fever): Secondary | ICD-10-CM | POA: Diagnosis not present

## 2019-11-21 NOTE — Patient Instructions (Addendum)
Mild intermittent asthma  Continue albuterol HFA, 1 to 2 inhalations every 4-6 hours if needed.  During respiratory tract infections or asthma flares, add Flovent 110g 2 inhalations via spacer device 2 times per day until symptoms have returned to baseline.  Subjective and objective measures of pulmonary function will be followed and the treatment plan will be adjusted accordingly.  Perennial allergic rhinitis with a predominant nonallergic component Stable.  Continue appropriate allergen avoidance measures, fluticasone nasal spray as needed, and/or azelastine nasal spray as needed.  Nasal saline spray (i.e., Simply Saline) or nasal saline lavage (i.e., NeilMed) is recommended as needed and prior to medicated nasal sprays.  Allergic conjunctivitis  Treatment plan as outlined above for allergic rhinitis.  Continue olopatadine eyedrops, 1 drop per eye daily when needed.  Eye lubricant drops (i.e., Natural Tears) are recommended if needed.   Return in about 5 months (around 04/20/2020), or if symptoms worsen or fail to improve.

## 2019-11-21 NOTE — Assessment & Plan Note (Signed)
   Continue albuterol HFA, 1 to 2 inhalations every 4-6 hours if needed.  During respiratory tract infections or asthma flares, add Flovent 110g 2 inhalations via spacer device 2 times per day until symptoms have returned to baseline.  Subjective and objective measures of pulmonary function will be followed and the treatment plan will be adjusted accordingly.

## 2019-11-21 NOTE — Progress Notes (Signed)
Follow-up Telemedicine Note  RE: Albert Eaton MRN: [id number] DOB: 01-25-1966 Date of Telemedicine Visit: 11/21/2019  Primary care provider: Patient, No Pcp Per Referring provider: Lanae Boast, [place]  Telemedicine Follow Up Visit via Telephone: I connected with Albert Eaton for a follow up on 11/21/19 by telephone and verified that I am speaking with the correct person using two identifiers.   The limitations, risks, security and privacy concerns of performing an evaluation and management service by telemedicine, the availability of in person appointments, and that there may be a patient responsible charge related to this service were discussed. The patient expressed understanding and agreed to proceed.  Patient is at home.  Provider is at the office.  Visit start time: 10:59 AM Visit end time: 11:14 AM Insurance consent/check in by: Mel Almond Medical consent and medical assistant/nurse: Lisabeth Pick  History of present illness: Albert Eaton is a 54 y.o. male with a complex medical history, persistent asthma, and allergic rhinoconjunctivitis presenting today via tele-visit for follow-up.  He was last evaluated via telemedicine in August 2020.  He reports that in the interval since his previous visit he has rarely required albuterol rescue and has not experienced limitations in normal daily activities or nocturnal awakenings due to lower respiratory symptoms.  He has used Flovent on occasion in lieu of albuterol to control acute symptoms.  He reports that his nasal allergy symptoms have been well controlled with as needed use of fluticasone nasal spray and/or azelastine nasal spray.  He rarely requires olopatadine eyedrops to control allergic conjunctivitis.  Assessment and plan: Mild intermittent asthma  Continue albuterol HFA, 1 to 2 inhalations every 4-6 hours if needed.  During respiratory tract infections or asthma flares, add Flovent 110g 2 inhalations via spacer device 2 times per  day until symptoms have returned to baseline.  Subjective and objective measures of pulmonary function will be followed and the treatment plan will be adjusted accordingly.  Perennial allergic rhinitis with a predominant nonallergic component Stable.  Continue appropriate allergen avoidance measures, fluticasone nasal spray as needed, and/or azelastine nasal spray as needed.  Nasal saline spray (i.e., Simply Saline) or nasal saline lavage (i.e., NeilMed) is recommended as needed and prior to medicated nasal sprays.  Allergic conjunctivitis  Treatment plan as outlined above for allergic rhinitis.  Continue olopatadine eyedrops, 1 drop per eye daily when needed.  Eye lubricant drops (i.e., Natural Tears) are recommended if needed.   No orders of the defined types were placed in this encounter.   Diagnostics: None.   Physical examination: Physical Exam Not obtained as encounter was done via telephone.   The following portions of the patient's history were reviewed and updated as appropriate: allergies, current medications, past family history, past medical history, past social history, past surgical history and problem list.  Allergies as of 11/21/2019      Reactions   Ace Inhibitors Cough, Other (See Comments)   Lisinopril Cough      Medication List       Accurate as of November 21, 2019 11:17 AM. If you have any questions, ask your nurse or doctor.        abacavir 300 MG tablet Commonly known as: ZIAGEN Take 1 tablet (300 mg total) by mouth daily.   albuterol 108 (90 Base) MCG/ACT inhaler Commonly known as: ProAir HFA Inhale 2 puffs into the lungs every 4 (four) hours as needed for wheezing or shortness of breath.   allopurinol 100 MG tablet Commonly known as: ZYLOPRIM TAKE  1 TABLET BY MOUTH TWICE DAILY   amLODipine 10 MG tablet Commonly known as: NORVASC Take 1 tablet (10 mg total) by mouth daily.   azelastine 0.1 % nasal spray Commonly known as:  ASTELIN Place 2 sprays into both nostrils 2 (two) times daily as needed for rhinitis. Use in each nostril as directed   carvedilol 25 MG tablet Commonly known as: COREG TAKE 1 TABLET BY MOUTH TWICE DAILY WITH FOOD   cloNIDine 0.1 MG tablet Commonly known as: CATAPRES TAKE 1 TABLET(0.1 MG) BY MOUTH TWICE DAILY   Dialyvite/Zinc Tabs TK 1 T PO QPM   famotidine 20 MG tablet Commonly known as: PEPCID Take 1 tablet (20 mg total) by mouth 2 (two) times daily.   fluticasone 110 MCG/ACT inhaler Commonly known as: Flovent HFA Inhale 2 puffs into the lungs 2 (two) times daily.   gabapentin 300 MG capsule Commonly known as: NEURONTIN TAKE 1 CAPSULE(300 MG) BY MOUTH THREE TIMES DAILY   hydrALAZINE 100 MG tablet Commonly known as: APRESOLINE Take 1 tablet (100 mg total) by mouth 3 (three) times daily. Please make yearly appt with Dr. [name] for [place] for future refills. 1st attempt   hyoscyamine 0.125 MG SL tablet Commonly known as: LEVSIN SL Place 1-2 tablets (0.125-0.25 mg total) under the tongue every 8 (eight) hours as needed (lower abdominal pain, spasm).   ipratropium 0.06 % nasal spray Commonly known as: ATROVENT 2 sprays per nostril 2 to 3 times daily as needed   Isentress 400 MG tablet Generic drug: raltegravir Take 1 tablet (400 mg total) by mouth 2 (two) times daily.   lamivudine 100 MG tablet Commonly known as: EPIVIR TAKE 1 TABLET(100 MG) BY MOUTH DAILY   levothyroxine 25 MCG tablet Commonly known as: SYNTHROID TAKE 1 TABLET BY MOUTH DAILY BEFORE BREAKFAST   lidocaine-prilocaine cream Commonly known as: EMLA APPLY SMALL AMOUNT TO ACCESS SITE (AVF) 1 TO 2 HOURS BEFORE DIALYSIS. COVER WITH OCCLUSIVE DRESSING (SARAN WRAP)   lubiprostone 24 MCG capsule Commonly known as: AMITIZA Take 1 capsule (24 mcg total) by mouth 2 (two) times daily with a meal. What changed: Another medication with the same name was removed. Continue taking this medication, and follow the  directions you see here. Changed by: Edmonia Lynch, MD   Olopatadine HCl 0.7 % Soln Commonly known as: [place] Place 1 drop into both eyes daily.   Spacer/Aero-Holding Dorise Bullion 1 Device by Does not apply route 2 (two) times a day.   Turmeric 500 MG Caps Take 500 mg by mouth daily.   Vitamin D (Ergocalciferol) 1.25 MG (50000 UNIT) Caps capsule Commonly known as: DRISDOL TAKE 1 CAPSULE BY MOUTH EVERY 7 DAYS   vitamin E 180 MG (400 UNITS) capsule Generic drug: vitamin E Take 400 Units by mouth daily.   zinc gluconate 50 MG tablet Take 50 mg by mouth daily.       Allergies  Allergen Reactions  . Ace Inhibitors Cough and Other (See Comments)  . Lisinopril Cough    Previous notes and tests were reviewed.  I discussed the assessment and treatment plan with the patient. The patient was provided an opportunity to ask questions and all were answered. The patient agreed with the plan and demonstrated an understanding of the instructions.   The patient was advised to call back or seek an in-person evaluation if the symptoms worsen or if the condition fails to improve as anticipated.  I provided 15 minutes of non-face-to-face time during this encounter.  I appreciate the opportunity to take part in [place] care. Please do not hesitate to contact me with questions.  Sincerely,   R. Edgar Frisk, MD

## 2019-11-21 NOTE — Assessment & Plan Note (Signed)
Stable.  Continue appropriate allergen avoidance measures, fluticasone nasal spray as needed, and/or azelastine nasal spray as needed.  Nasal saline spray (i.e., Simply Saline) or nasal saline lavage (i.e., NeilMed) is recommended as needed and prior to medicated nasal sprays.

## 2019-11-21 NOTE — Assessment & Plan Note (Signed)
   Treatment plan as outlined above for allergic rhinitis.  Continue olopatadine eyedrops, 1 drop per eye daily when needed.  Eye lubricant drops (i.e., Natural Tears) are recommended if needed.

## 2019-11-22 ENCOUNTER — Other Ambulatory Visit: Payer: Self-pay | Admitting: Nephrology

## 2019-11-22 ENCOUNTER — Telehealth: Payer: Self-pay

## 2019-11-22 ENCOUNTER — Ambulatory Visit
Admission: RE | Admit: 2019-11-22 | Discharge: 2019-11-22 | Disposition: A | Discharge status: Home / Self Care | Payer: Medicare Other | Source: Ambulatory Visit | Attending: Nephrology | Admitting: Nephrology

## 2019-11-22 DIAGNOSIS — N186 End stage renal disease: Secondary | ICD-10-CM

## 2019-11-22 IMAGING — DX DG CHEST 2V
2 series · 2 of 2 positions shown · non-contrast
Comparison: [DATE]

CLINICAL DATA: End-stage renal disease.  HIV disease

EXAM:
CHEST - 2 VIEW

[dg chest 2 view (1 of 2)]
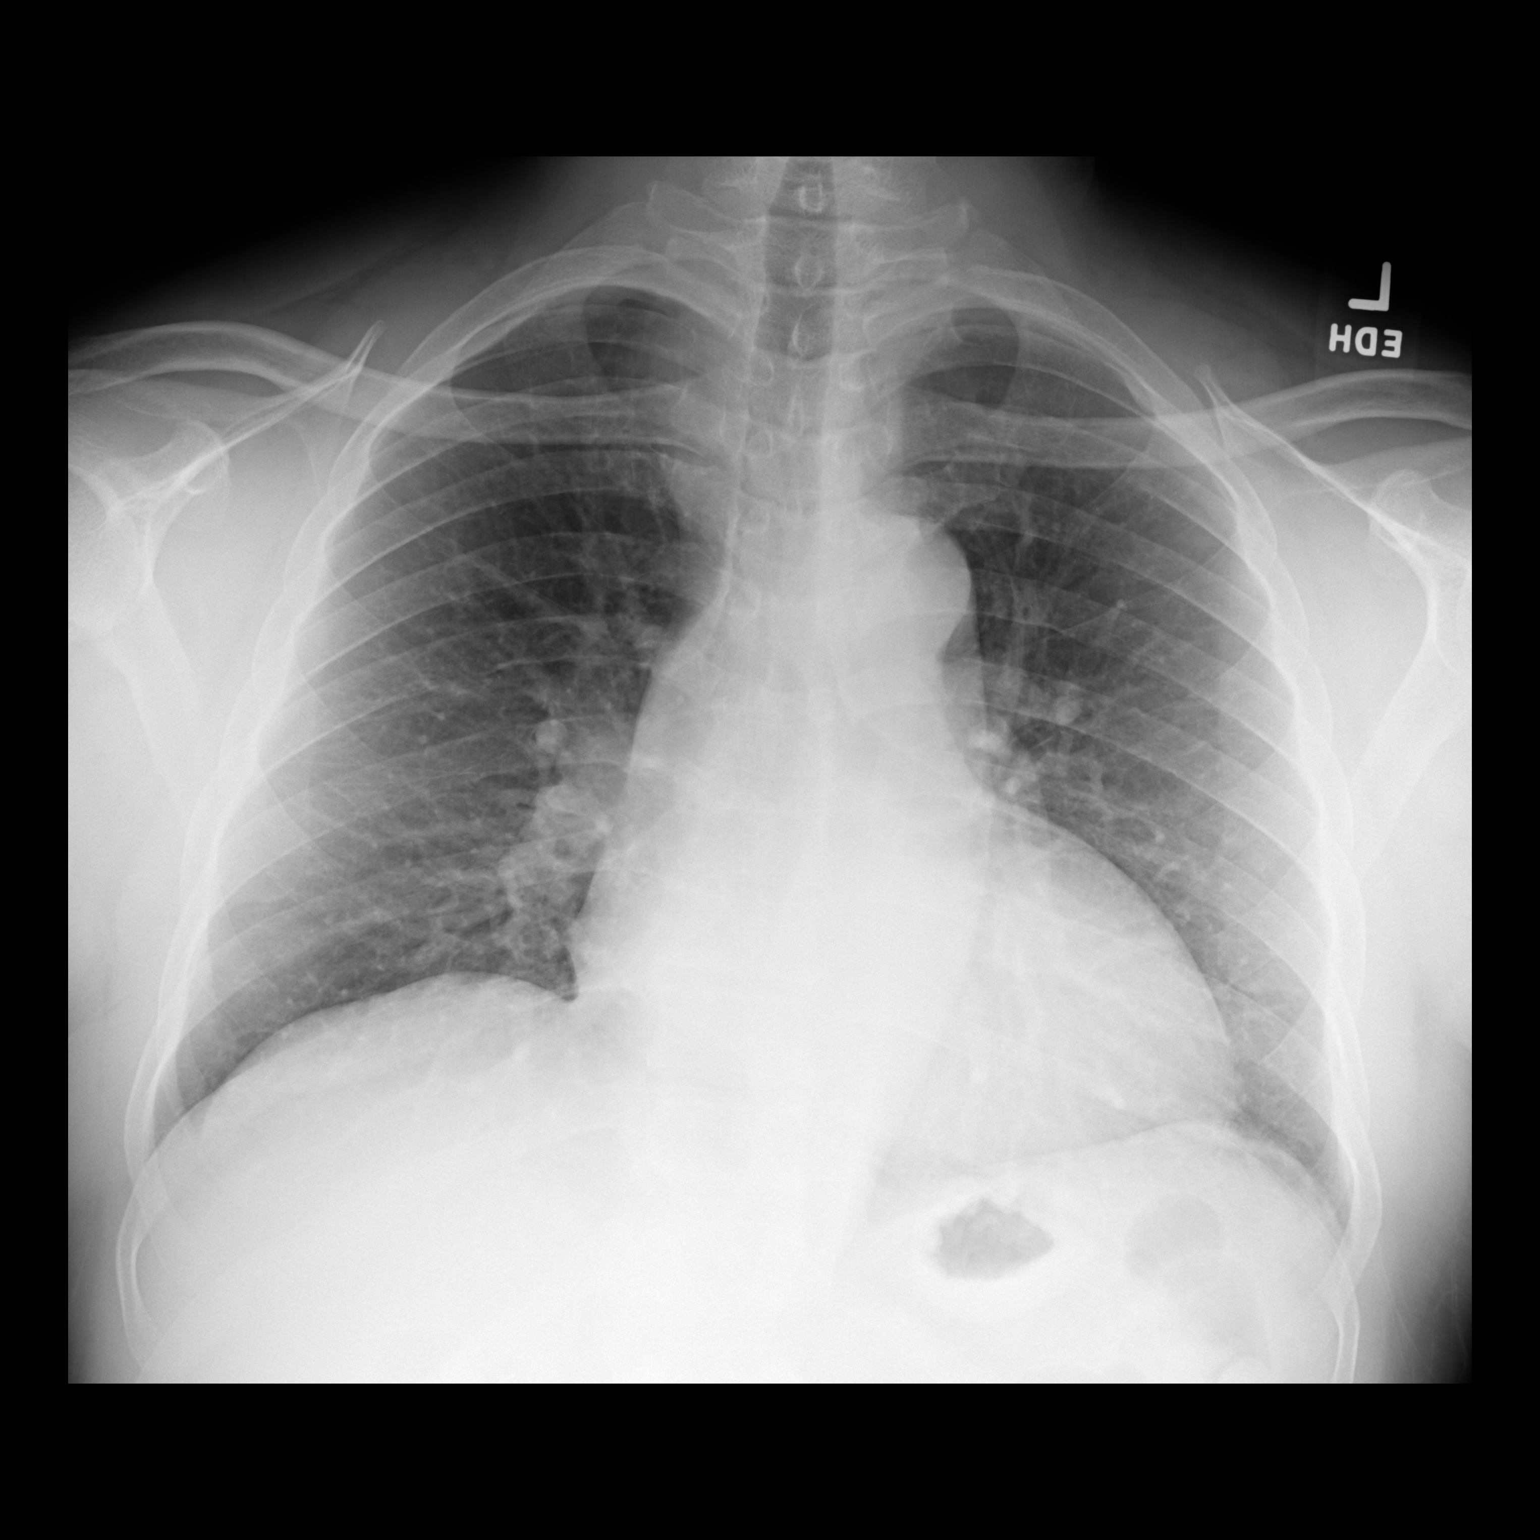

[dg chest 2 view (2 of 2)]
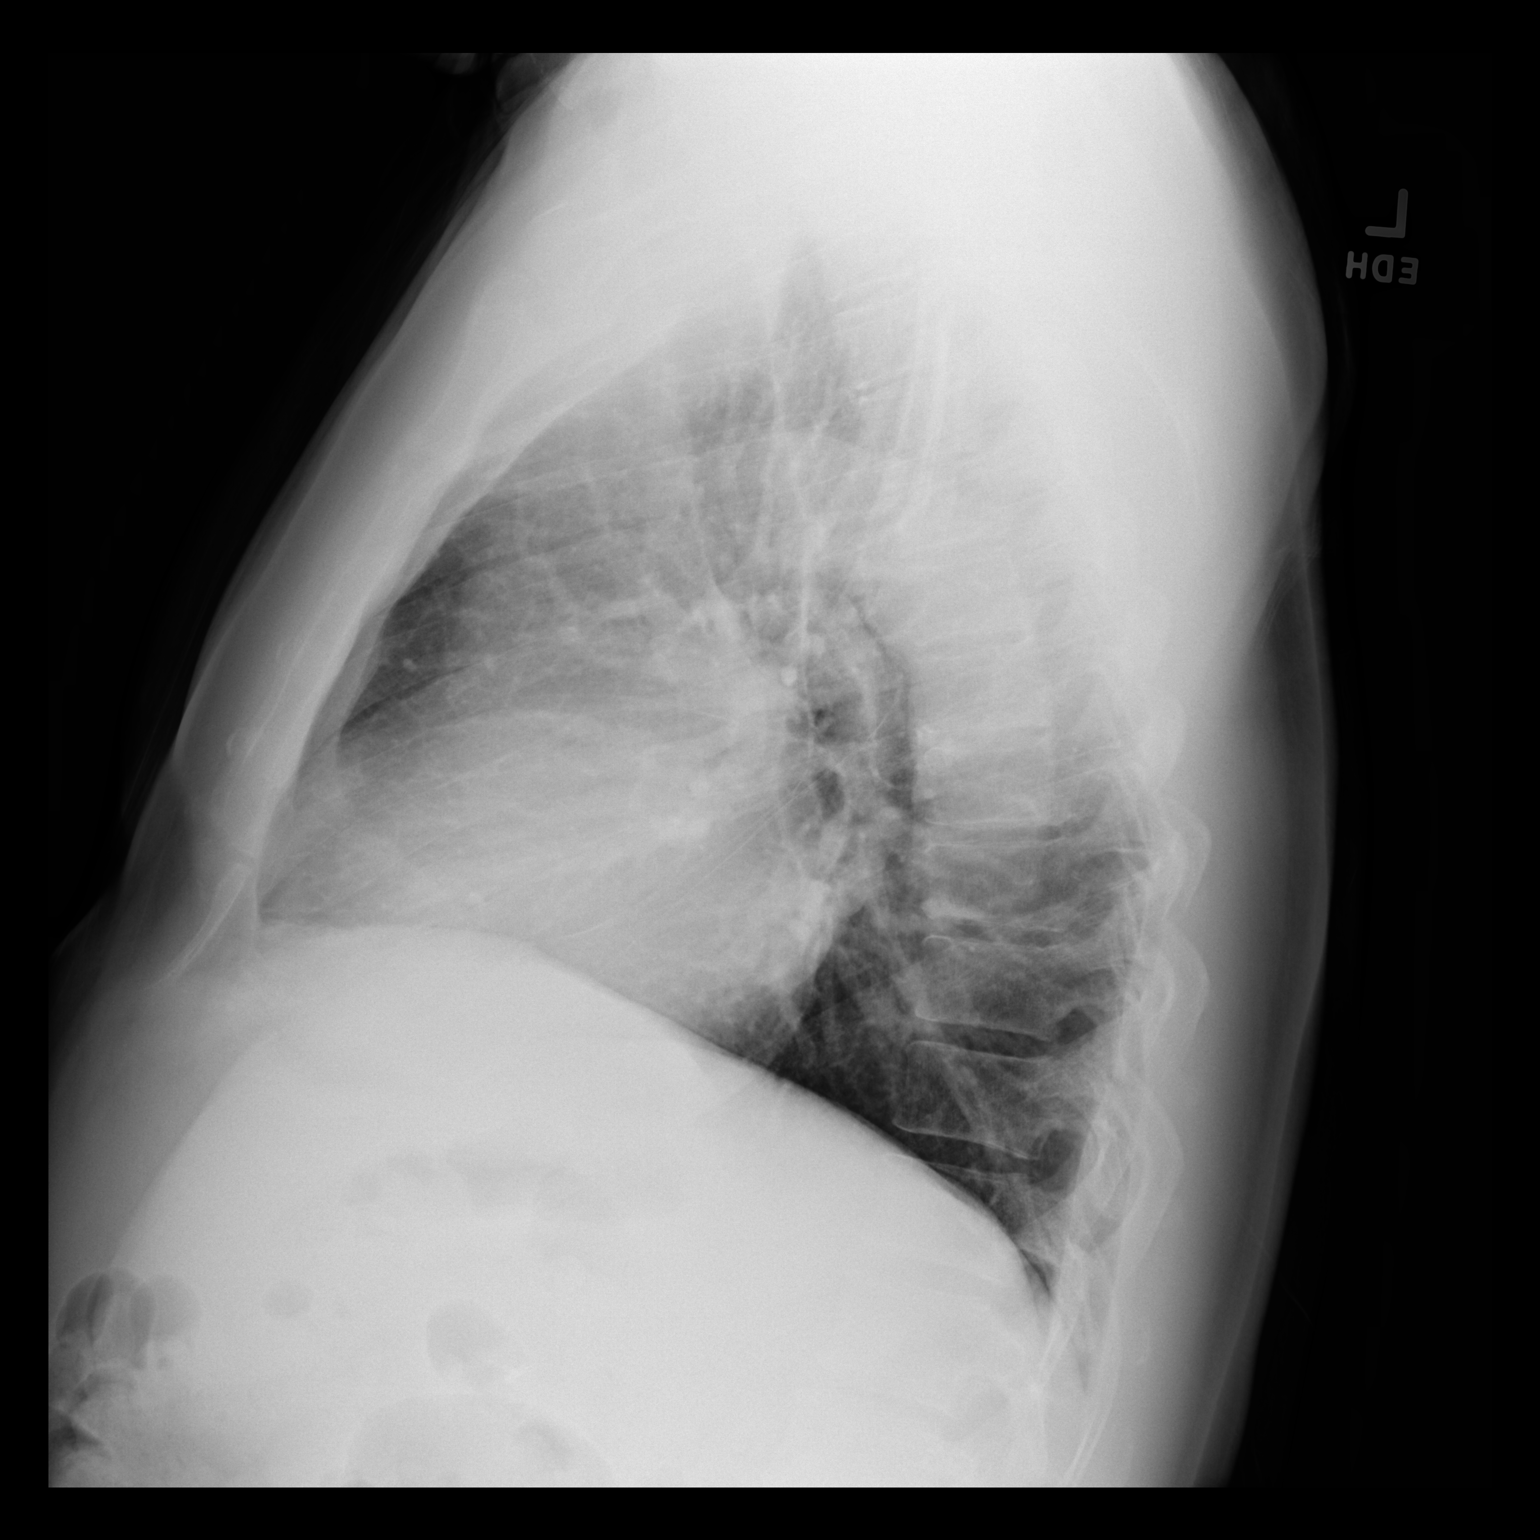

[2 of 2 positions shown; findings below may reference images not displayed]

FINDINGS: Lungs are clear. Heart is mildly enlarged with pulmonary vascularity
normal. No adenopathy. No bone lesions.
IMPRESSION: Stable cardiac prominence.  Lungs clear.  No adenopathy.

## 2019-11-22 NOTE — Telephone Encounter (Signed)
Received call from [place] [phone]) requesting a letter from MD for [facility] for proof of positivity. Patient has been undetectable since initial visit.  Routing to provider for advise. Eugenia Mcalpine

## 2019-11-23 DIAGNOSIS — R6883 Chills (without fever): Secondary | ICD-10-CM | POA: Diagnosis not present

## 2019-11-23 DIAGNOSIS — N186 End stage renal disease: Secondary | ICD-10-CM | POA: Diagnosis not present

## 2019-11-23 DIAGNOSIS — R5383 Other fatigue: Secondary | ICD-10-CM | POA: Diagnosis not present

## 2019-11-23 DIAGNOSIS — N2581 Secondary hyperparathyroidism of renal origin: Secondary | ICD-10-CM | POA: Diagnosis not present

## 2019-11-23 DIAGNOSIS — E876 Hypokalemia: Secondary | ICD-10-CM | POA: Diagnosis not present

## 2019-11-23 DIAGNOSIS — D689 Coagulation defect, unspecified: Secondary | ICD-10-CM | POA: Diagnosis not present

## 2019-11-23 DIAGNOSIS — D631 Anemia in chronic kidney disease: Secondary | ICD-10-CM | POA: Diagnosis not present

## 2019-11-23 DIAGNOSIS — Z992 Dependence on renal dialysis: Secondary | ICD-10-CM | POA: Diagnosis not present

## 2019-11-25 DIAGNOSIS — E876 Hypokalemia: Secondary | ICD-10-CM | POA: Diagnosis not present

## 2019-11-25 DIAGNOSIS — N2581 Secondary hyperparathyroidism of renal origin: Secondary | ICD-10-CM | POA: Diagnosis not present

## 2019-11-25 DIAGNOSIS — D631 Anemia in chronic kidney disease: Secondary | ICD-10-CM | POA: Diagnosis not present

## 2019-11-25 DIAGNOSIS — R5383 Other fatigue: Secondary | ICD-10-CM | POA: Diagnosis not present

## 2019-11-25 DIAGNOSIS — D689 Coagulation defect, unspecified: Secondary | ICD-10-CM | POA: Diagnosis not present

## 2019-11-25 DIAGNOSIS — Z992 Dependence on renal dialysis: Secondary | ICD-10-CM | POA: Diagnosis not present

## 2019-11-25 DIAGNOSIS — R6883 Chills (without fever): Secondary | ICD-10-CM | POA: Diagnosis not present

## 2019-11-25 DIAGNOSIS — N186 End stage renal disease: Secondary | ICD-10-CM | POA: Diagnosis not present

## 2019-11-27 DIAGNOSIS — N186 End stage renal disease: Secondary | ICD-10-CM | POA: Diagnosis not present

## 2019-11-27 DIAGNOSIS — I129 Hypertensive chronic kidney disease with stage 1 through stage 4 chronic kidney disease, or unspecified chronic kidney disease: Secondary | ICD-10-CM | POA: Diagnosis not present

## 2019-11-27 DIAGNOSIS — Z992 Dependence on renal dialysis: Secondary | ICD-10-CM | POA: Diagnosis not present

## 2019-11-28 ENCOUNTER — Other Ambulatory Visit: Payer: Self-pay | Admitting: Family Medicine

## 2019-11-28 DIAGNOSIS — N186 End stage renal disease: Secondary | ICD-10-CM | POA: Diagnosis not present

## 2019-11-28 DIAGNOSIS — N2581 Secondary hyperparathyroidism of renal origin: Secondary | ICD-10-CM | POA: Diagnosis not present

## 2019-11-28 DIAGNOSIS — E876 Hypokalemia: Secondary | ICD-10-CM | POA: Diagnosis not present

## 2019-11-28 DIAGNOSIS — Z992 Dependence on renal dialysis: Secondary | ICD-10-CM | POA: Diagnosis not present

## 2019-11-28 DIAGNOSIS — D689 Coagulation defect, unspecified: Secondary | ICD-10-CM | POA: Diagnosis not present

## 2019-11-28 DIAGNOSIS — E039 Hypothyroidism, unspecified: Secondary | ICD-10-CM

## 2019-11-28 DIAGNOSIS — M109 Gout, unspecified: Secondary | ICD-10-CM

## 2019-11-30 DIAGNOSIS — D689 Coagulation defect, unspecified: Secondary | ICD-10-CM | POA: Diagnosis not present

## 2019-11-30 DIAGNOSIS — Z992 Dependence on renal dialysis: Secondary | ICD-10-CM | POA: Diagnosis not present

## 2019-11-30 DIAGNOSIS — N2581 Secondary hyperparathyroidism of renal origin: Secondary | ICD-10-CM | POA: Diagnosis not present

## 2019-11-30 DIAGNOSIS — N186 End stage renal disease: Secondary | ICD-10-CM | POA: Diagnosis not present

## 2019-11-30 DIAGNOSIS — E876 Hypokalemia: Secondary | ICD-10-CM | POA: Diagnosis not present

## 2019-12-02 DIAGNOSIS — N2581 Secondary hyperparathyroidism of renal origin: Secondary | ICD-10-CM | POA: Diagnosis not present

## 2019-12-02 DIAGNOSIS — E876 Hypokalemia: Secondary | ICD-10-CM | POA: Diagnosis not present

## 2019-12-02 DIAGNOSIS — D689 Coagulation defect, unspecified: Secondary | ICD-10-CM | POA: Diagnosis not present

## 2019-12-02 DIAGNOSIS — Z992 Dependence on renal dialysis: Secondary | ICD-10-CM | POA: Diagnosis not present

## 2019-12-02 DIAGNOSIS — N186 End stage renal disease: Secondary | ICD-10-CM | POA: Diagnosis not present

## 2019-12-05 DIAGNOSIS — N186 End stage renal disease: Secondary | ICD-10-CM | POA: Diagnosis not present

## 2019-12-05 DIAGNOSIS — E876 Hypokalemia: Secondary | ICD-10-CM | POA: Diagnosis not present

## 2019-12-05 DIAGNOSIS — N2581 Secondary hyperparathyroidism of renal origin: Secondary | ICD-10-CM | POA: Diagnosis not present

## 2019-12-05 DIAGNOSIS — Z992 Dependence on renal dialysis: Secondary | ICD-10-CM | POA: Diagnosis not present

## 2019-12-05 DIAGNOSIS — D689 Coagulation defect, unspecified: Secondary | ICD-10-CM | POA: Diagnosis not present

## 2019-12-07 DIAGNOSIS — E876 Hypokalemia: Secondary | ICD-10-CM | POA: Diagnosis not present

## 2019-12-07 DIAGNOSIS — Z992 Dependence on renal dialysis: Secondary | ICD-10-CM | POA: Diagnosis not present

## 2019-12-07 DIAGNOSIS — N2581 Secondary hyperparathyroidism of renal origin: Secondary | ICD-10-CM | POA: Diagnosis not present

## 2019-12-07 DIAGNOSIS — D689 Coagulation defect, unspecified: Secondary | ICD-10-CM | POA: Diagnosis not present

## 2019-12-07 DIAGNOSIS — N186 End stage renal disease: Secondary | ICD-10-CM | POA: Diagnosis not present

## 2019-12-09 DIAGNOSIS — Z992 Dependence on renal dialysis: Secondary | ICD-10-CM | POA: Diagnosis not present

## 2019-12-09 DIAGNOSIS — N2581 Secondary hyperparathyroidism of renal origin: Secondary | ICD-10-CM | POA: Diagnosis not present

## 2019-12-09 DIAGNOSIS — N186 End stage renal disease: Secondary | ICD-10-CM | POA: Diagnosis not present

## 2019-12-09 DIAGNOSIS — D689 Coagulation defect, unspecified: Secondary | ICD-10-CM | POA: Diagnosis not present

## 2019-12-09 DIAGNOSIS — E876 Hypokalemia: Secondary | ICD-10-CM | POA: Diagnosis not present

## 2019-12-12 DIAGNOSIS — N2581 Secondary hyperparathyroidism of renal origin: Secondary | ICD-10-CM | POA: Diagnosis not present

## 2019-12-12 DIAGNOSIS — E876 Hypokalemia: Secondary | ICD-10-CM | POA: Diagnosis not present

## 2019-12-12 DIAGNOSIS — Z992 Dependence on renal dialysis: Secondary | ICD-10-CM | POA: Diagnosis not present

## 2019-12-12 DIAGNOSIS — D689 Coagulation defect, unspecified: Secondary | ICD-10-CM | POA: Diagnosis not present

## 2019-12-12 DIAGNOSIS — N186 End stage renal disease: Secondary | ICD-10-CM | POA: Diagnosis not present

## 2019-12-14 DIAGNOSIS — N186 End stage renal disease: Secondary | ICD-10-CM | POA: Diagnosis not present

## 2019-12-14 DIAGNOSIS — Z992 Dependence on renal dialysis: Secondary | ICD-10-CM | POA: Diagnosis not present

## 2019-12-14 DIAGNOSIS — N2581 Secondary hyperparathyroidism of renal origin: Secondary | ICD-10-CM | POA: Diagnosis not present

## 2019-12-14 DIAGNOSIS — E876 Hypokalemia: Secondary | ICD-10-CM | POA: Diagnosis not present

## 2019-12-14 DIAGNOSIS — D689 Coagulation defect, unspecified: Secondary | ICD-10-CM | POA: Diagnosis not present

## 2019-12-16 DIAGNOSIS — D689 Coagulation defect, unspecified: Secondary | ICD-10-CM | POA: Diagnosis not present

## 2019-12-16 DIAGNOSIS — Z992 Dependence on renal dialysis: Secondary | ICD-10-CM | POA: Diagnosis not present

## 2019-12-16 DIAGNOSIS — N2581 Secondary hyperparathyroidism of renal origin: Secondary | ICD-10-CM | POA: Diagnosis not present

## 2019-12-16 DIAGNOSIS — N186 End stage renal disease: Secondary | ICD-10-CM | POA: Diagnosis not present

## 2019-12-16 DIAGNOSIS — E876 Hypokalemia: Secondary | ICD-10-CM | POA: Diagnosis not present

## 2019-12-19 DIAGNOSIS — Z992 Dependence on renal dialysis: Secondary | ICD-10-CM | POA: Diagnosis not present

## 2019-12-19 DIAGNOSIS — N2581 Secondary hyperparathyroidism of renal origin: Secondary | ICD-10-CM | POA: Diagnosis not present

## 2019-12-19 DIAGNOSIS — E876 Hypokalemia: Secondary | ICD-10-CM | POA: Diagnosis not present

## 2019-12-19 DIAGNOSIS — D689 Coagulation defect, unspecified: Secondary | ICD-10-CM | POA: Diagnosis not present

## 2019-12-19 DIAGNOSIS — N186 End stage renal disease: Secondary | ICD-10-CM | POA: Diagnosis not present

## 2019-12-20 DIAGNOSIS — I871 Compression of vein: Secondary | ICD-10-CM | POA: Diagnosis not present

## 2019-12-20 DIAGNOSIS — Z992 Dependence on renal dialysis: Secondary | ICD-10-CM | POA: Diagnosis not present

## 2019-12-20 DIAGNOSIS — N186 End stage renal disease: Secondary | ICD-10-CM | POA: Diagnosis not present

## 2019-12-21 DIAGNOSIS — N2581 Secondary hyperparathyroidism of renal origin: Secondary | ICD-10-CM | POA: Diagnosis not present

## 2019-12-21 DIAGNOSIS — N186 End stage renal disease: Secondary | ICD-10-CM | POA: Diagnosis not present

## 2019-12-21 DIAGNOSIS — D689 Coagulation defect, unspecified: Secondary | ICD-10-CM | POA: Diagnosis not present

## 2019-12-21 DIAGNOSIS — E876 Hypokalemia: Secondary | ICD-10-CM | POA: Diagnosis not present

## 2019-12-21 DIAGNOSIS — Z992 Dependence on renal dialysis: Secondary | ICD-10-CM | POA: Diagnosis not present

## 2019-12-23 DIAGNOSIS — N2581 Secondary hyperparathyroidism of renal origin: Secondary | ICD-10-CM | POA: Diagnosis not present

## 2019-12-23 DIAGNOSIS — N186 End stage renal disease: Secondary | ICD-10-CM | POA: Diagnosis not present

## 2019-12-23 DIAGNOSIS — E876 Hypokalemia: Secondary | ICD-10-CM | POA: Diagnosis not present

## 2019-12-23 DIAGNOSIS — Z992 Dependence on renal dialysis: Secondary | ICD-10-CM | POA: Diagnosis not present

## 2019-12-23 DIAGNOSIS — D689 Coagulation defect, unspecified: Secondary | ICD-10-CM | POA: Diagnosis not present

## 2019-12-25 DIAGNOSIS — N186 End stage renal disease: Secondary | ICD-10-CM | POA: Diagnosis not present

## 2019-12-25 DIAGNOSIS — I129 Hypertensive chronic kidney disease with stage 1 through stage 4 chronic kidney disease, or unspecified chronic kidney disease: Secondary | ICD-10-CM | POA: Diagnosis not present

## 2019-12-25 DIAGNOSIS — Z992 Dependence on renal dialysis: Secondary | ICD-10-CM | POA: Diagnosis not present

## 2019-12-26 ENCOUNTER — Telehealth: Payer: Self-pay | Admitting: Family Medicine

## 2019-12-26 DIAGNOSIS — N186 End stage renal disease: Secondary | ICD-10-CM | POA: Diagnosis not present

## 2019-12-26 DIAGNOSIS — D689 Coagulation defect, unspecified: Secondary | ICD-10-CM | POA: Diagnosis not present

## 2019-12-26 DIAGNOSIS — Z992 Dependence on renal dialysis: Secondary | ICD-10-CM | POA: Diagnosis not present

## 2019-12-26 DIAGNOSIS — E876 Hypokalemia: Secondary | ICD-10-CM | POA: Diagnosis not present

## 2019-12-26 DIAGNOSIS — N2581 Secondary hyperparathyroidism of renal origin: Secondary | ICD-10-CM | POA: Diagnosis not present

## 2019-12-26 NOTE — Telephone Encounter (Signed)
Pt was called and reminded of there appointment

## 2019-12-27 ENCOUNTER — Other Ambulatory Visit: Payer: Self-pay

## 2019-12-27 ENCOUNTER — Ambulatory Visit (INDEPENDENT_AMBULATORY_CARE_PROVIDER_SITE_OTHER): Payer: Medicare Other | Admitting: Family Medicine

## 2019-12-27 ENCOUNTER — Encounter: Payer: Self-pay | Admitting: Family Medicine

## 2019-12-27 VITALS — BP 122/74 | HR 70 | Temp 98.7°F | Resp 16 | Ht 70.0 in | Wt 204.0 lb

## 2019-12-27 DIAGNOSIS — L738 Other specified follicular disorders: Secondary | ICD-10-CM

## 2019-12-27 DIAGNOSIS — I151 Hypertension secondary to other renal disorders: Secondary | ICD-10-CM

## 2019-12-27 DIAGNOSIS — G4730 Sleep apnea, unspecified: Secondary | ICD-10-CM | POA: Diagnosis not present

## 2019-12-27 DIAGNOSIS — E039 Hypothyroidism, unspecified: Secondary | ICD-10-CM

## 2019-12-27 DIAGNOSIS — N2889 Other specified disorders of kidney and ureter: Secondary | ICD-10-CM

## 2019-12-27 DIAGNOSIS — N184 Chronic kidney disease, stage 4 (severe): Secondary | ICD-10-CM | POA: Diagnosis not present

## 2019-12-27 DIAGNOSIS — I110 Hypertensive heart disease with heart failure: Secondary | ICD-10-CM

## 2019-12-27 DIAGNOSIS — G8929 Other chronic pain: Secondary | ICD-10-CM

## 2019-12-27 DIAGNOSIS — M25561 Pain in right knee: Secondary | ICD-10-CM

## 2019-12-27 LAB — POCT URINALYSIS DIPSTICK
Bilirubin, UA: NEGATIVE
Blood, UA: NEGATIVE
Glucose, UA: NEGATIVE
Ketones, UA: NEGATIVE
Leukocytes, UA: NEGATIVE
Nitrite, UA: NEGATIVE
Protein, UA: POSITIVE — AB
Spec Grav, UA: >=1.03 — AB (ref 1.010–1.025)
Urobilinogen, UA: 0.2 E.U./dL
pH, UA: 5 (ref 5.0–8.0)

## 2019-12-27 NOTE — Patient Instructions (Signed)
COVID-19 Vaccine Information can be found at: [email or web address] For questions related to vaccine distribution or appointments, please email [email or web address] or call [phone].   Referrals to ophthalmology and dermatology have been.   Reviewed previous lab values.

## 2019-12-27 NOTE — Progress Notes (Signed)
[facility]   Established Patient Office Visit  Subjective:  Patient ID: Albert Eaton, male    DOB: Mar 05, 1966  Age: 54 y.o. MRN: [id number]  CC:  Chief Complaint  Patient presents with  . Hypertension  . Hypothyroidism  . Knee Pain    right knee pain   . Ankle Pain    right pain feels "off balance"   . Epidermal Cyst    on face that grows back, asking to see dermatology     HPI Albert Eaton, a 54 year old male with a medical history significant for hypertension, CHF with LV diastolic dysfunction NYHA class 1, atherosclerosis, obstructive sleep apnea, hypothyroidism, neuropathy, HIV disease, and hypertensive chronic kidney disease presents for a follow up of chronic conditions.    Skin irritation:  Albert Eaton is complaining of skin lesions primarily to right face. Lesions have been occurring intermittently over the past several months. He says that he removed a lesion with tweezers that has now hardened. He denies drainage, redness, or itching. He has not attempted any over the counter interventions to assist with this problem.  Hypertension Pertinent negatives include no neck pain, palpitations or shortness of breath. There is no history of kidney disease, CAD/MI, CVA, heart failure or left ventricular hypertrophy.  Knee Pain  There was no injury mechanism. The pain is present in the right knee and right ankle. The pain is at a severity of 3/10. The pain is mild. The pain has been intermittent since onset. Associated symptoms include an inability to bear weight. Pertinent negatives include no numbness or tingling. He has tried nothing for the symptoms.  Ankle Pain  The pain is present in the right ankle. The quality of the pain is described as aching. The pain is at a severity of 3/10. The pain has been intermittent since onset. Associated symptoms include an inability to bear weight. Pertinent negatives include no numbness or  tingling.    Past Medical History:  Diagnosis Date  . Anemia   . Aortic atherosclerosis ([place])   . Asthma   . Chronic diastolic CHF (congestive heart failure) ([place]) 01/06/2017   Echo 7/16 [facility] in [place], [name]) Mild AI, mild LAE, mild concentric LVH, EF 55, normal wall motion, mild to moderate MR, mild PI, RVSP 55 // Echo 10/09/16 ([facility]):  Moderate LVH, grade 2 diastolic dysfunction, mild MR, moderate LAE   . CKD (chronic kidney disease)    per pt, waiting on a kidney transplant in near future.  . Esophagitis   . Gout   . Hepatitis    Hep B  . History of nuclear stress test    a. Nuc study 7/16: no scar or ischemia, EF 42 // b. Nuc study 12/17: EF 48, ?small apical ischemia, Low Risk  . HIV (human immunodeficiency virus infection) ([place])   . HLD (hyperlipidemia)   . Hypertension   . Hypertensive heart disease with CHF (congestive heart failure) ([place]) 10/09/2016  . Mitral regurgitation   . OSA (obstructive sleep apnea)   . Post-operative nausea and vomiting    1 time as a child  . Sleep apnea    uses c-pap  . Wears glasses   . Wears partial dentures     Past Surgical History:  Procedure Laterality Date  . AV FISTULA PLACEMENT Left 07/02/2018   Procedure: Creation of Left arm BRACHIOBASILIC ARTERIOVENOUS  FISTULA;  Surgeon: Marty Heck, MD;  Location: [place];  Service: Vascular;  Laterality: Left;  . BASCILIC VEIN TRANSPOSITION Left 08/27/2018   Procedure: Left arm BRACHIOBASILIC VEIN TRANSPOSITION SECOND STAGE;  Surgeon: Marty Heck, MD;  Location: [place];  Service: Vascular;  Laterality: Left;  . CHOLECYSTECTOMY    . COLONOSCOPY W/ BIOPSIES AND POLYPECTOMY    . GIVENS CAPSULE STUDY N/A 03/01/2018   Procedure: GIVENS CAPSULE STUDY;  Surgeon: Jerene Bears, MD;  Location: [place];  Service: Gastroenterology;  Laterality: N/A;  . HERNIA REPAIR     As baby  . MULTIPLE TOOTH EXTRACTIONS    . NOSE SURGERY    . RENAL BIOPSY      Family History    Problem Relation Age of Onset  . Heart failure Mother   . Heart attack Maternal Grandmother 53  . Hypertension Sister   . Multiple sclerosis Sister   . Hypertension Brother   . Hypertension Sister   . Scoliosis Other   . Allergic rhinitis Neg Hx   . Angioedema Neg Hx   . Asthma Neg Hx   . Eczema Neg Hx   . Immunodeficiency Neg Hx   . Urticaria Neg Hx     Social History   Socioeconomic History  . Marital status: Single    Spouse name: Not on file  . Number of children: Not on file  . Years of education: Not on file  . Highest education level: Not on file  Occupational History  . Not on file  Tobacco Use  . Smoking status: Former Smoker    Types: Cigarettes    Quit date: 2000    Years since quitting: 21.1  . Smokeless tobacco: Never Used  Substance and Sexual Activity  . Alcohol use: Not Currently    Alcohol/week: 2.0 standard drinks    Types: 2 Shots of liquor per week  . Drug use: No  . Sexual activity: Never    Partners: Male    Birth control/protection: Condom    Comment: declined condoms  Other Topics Concern  . Not on file  Social History Narrative  . Not on file   Social Determinants of Health   Financial Resource Strain:   . Difficulty of Paying Living Expenses: Not on file  Food Insecurity:   . Worried About Charity fundraiser in the Last Year: Not on file  . Ran Out of Food in the Last Year: Not on file  Transportation Needs:   . Lack of Transportation (Medical): Not on file  . Lack of Transportation (Non-Medical): Not on file  Physical Activity:   . Days of Exercise per Week: Not on file  . Minutes of Exercise per Session: Not on file  Stress:   . Feeling of Stress : Not on file  Social Connections:   . Frequency of Communication with Friends and Family: Not on file  . Frequency of Social Gatherings with Friends and Family: Not on file  . Attends Religious Services: Not on file  . Active Member of Clubs or Organizations: Not on file  .  Attends Archivist Meetings: Not on file  . Marital Status: Not on file  Intimate Partner Violence:   . Fear of Current or Ex-Partner: Not on file  . Emotionally Abused: Not on file  . Physically Abused: Not on file  . Sexually Abused: Not on file    Outpatient Medications Prior to Visit  Medication Sig Dispense Refill  . abacavir (ZIAGEN) 300 MG tablet Take 1 tablet (300 mg total) by  mouth daily. 30 tablet 3  . albuterol (PROAIR HFA) 108 (90 Base) MCG/ACT inhaler Inhale 2 puffs into the lungs every 4 (four) hours as needed for wheezing or shortness of breath. 1 Inhaler 3  . allopurinol (ZYLOPRIM) 100 MG tablet TAKE 1 TABLET BY MOUTH TWICE DAILY 60 tablet 0  . amLODipine (NORVASC) 10 MG tablet Take 1 tablet (10 mg total) by mouth daily. 30 tablet 1  . Ascorbic Acid (VITAMIN C WITH ROSE HIPS) 1000 MG tablet Take 1,000 mg by mouth daily.    Marland Kitchen azelastine (ASTELIN) 0.1 % nasal spray Place 2 sprays into both nostrils 2 (two) times daily as needed for rhinitis. Use in each nostril as directed 30 mL 5  . B Complex-C-Zn-Folic Acid (DIALYVITE/ZINC) TABS TK 1 T PO QPM    . carvedilol (COREG) 25 MG tablet TAKE 1 TABLET BY MOUTH TWICE DAILY WITH FOOD 60 tablet 11  . cloNIDine (CATAPRES) 0.1 MG tablet TAKE 1 TABLET(0.1 MG) BY MOUTH TWICE DAILY 180 tablet 2  . famotidine (PEPCID) 20 MG tablet Take 1 tablet (20 mg total) by mouth 2 (two) times daily. 60 tablet 11  . fluticasone (FLOVENT HFA) 110 MCG/ACT inhaler Inhale 2 puffs into the lungs 2 (two) times daily. 1 Inhaler 5  . gabapentin (NEURONTIN) 300 MG capsule TAKE 1 CAPSULE(300 MG) BY MOUTH THREE TIMES DAILY 90 capsule 3  . hydrALAZINE (APRESOLINE) 100 MG tablet Take 1 tablet (100 mg total) by mouth 3 (three) times daily. Please make yearly appt with Dr. [name] for [place] for future refills. 1st attempt 270 tablet 0  . hyoscyamine (LEVSIN SL) 0.125 MG SL tablet Place 1-2 tablets (0.125-0.25 mg total) under the tongue every 8 (eight) hours as  needed (lower abdominal pain, spasm). 90 tablet 2  . ipratropium (ATROVENT) 0.06 % nasal spray 2 sprays per nostril 2 to 3 times daily as needed 15 mL 5  . lamivudine (EPIVIR) 100 MG tablet TAKE 1 TABLET(100 MG) BY MOUTH DAILY 30 tablet 3  . levothyroxine (SYNTHROID) 25 MCG tablet TAKE 1 TABLET BY MOUTH DAILY BEFORE BREAKFAST 30 tablet 0  . lidocaine-prilocaine (EMLA) cream APPLY SMALL AMOUNT TO ACCESS SITE (AVF) 1 TO 2 HOURS BEFORE DIALYSIS. COVER WITH OCCLUSIVE DRESSING (SARAN WRAP)    . lubiprostone (AMITIZA) 24 MCG capsule Take 1 capsule (24 mcg total) by mouth 2 (two) times daily with a meal. 60 capsule 3  . Olopatadine HCl (PAZEO) 0.7 % SOLN Place 1 drop into both eyes daily. 1 Bottle 5  . raltegravir (ISENTRESS) 400 MG tablet Take 1 tablet (400 mg total) by mouth 2 (two) times daily. 60 tablet 3  . Spacer/Aero-Holding Chambers DEVI 1 Device by Does not apply route 2 (two) times a day. 1 each 1  . Turmeric 500 MG CAPS Take 500 mg by mouth daily.    . Vitamin D, Ergocalciferol, (DRISDOL) 1.25 MG (50000 UT) CAPS capsule TAKE 1 CAPSULE BY MOUTH EVERY 7 DAYS 8 capsule 0  . vitamin E (VITAMIN E) 180 MG (400 UNITS) capsule Take 400 Units by mouth daily.    Marland Kitchen zinc gluconate 50 MG tablet Take 50 mg by mouth daily.      No facility-administered medications prior to visit.    Allergies  Allergen Reactions  . Ace Inhibitors Cough and Other (See Comments)  . Lisinopril Cough    ROS Review of Systems  Constitutional: Negative.   HENT: Negative.   Eyes: Negative.   Respiratory: Negative.  Negative for shortness of breath.  Cardiovascular: Negative.  Negative for palpitations.  Gastrointestinal: Negative.   Endocrine: Negative.   Genitourinary: Negative.   Musculoskeletal: Positive for arthralgias (Right knee pain). Negative for neck pain.  Skin: Negative.   Neurological: Negative.  Negative for tingling, weakness and numbness.  Psychiatric/Behavioral: Negative.       Objective:      Physical Exam  Pulmonary/Chest: Effort normal.  Abdominal: Soft. Bowel sounds are normal.  Musculoskeletal:     Cervical back: Normal range of motion.     Right knee: No swelling, deformity or erythema. Decreased range of motion. Normal alignment.     Right ankle: No swelling. Decreased range of motion.  Neurological: He is alert.  Skin: Skin is warm and dry.  Papules varying in size and shape, primarily to right face. No erythema or drainage noted.   Psychiatric: He has a normal mood and affect. His behavior is normal.    BP 122/74 (BP Location: Right Arm, Patient Position: Sitting, Cuff Size: Large)   Pulse 70   Temp 98.7 F (37.1 C) (Oral)   Resp 16   Ht 5' 10"  (1.778 m)   Wt 204 lb (92.5 kg)   SpO2 99%   BMI 29.27 kg/m  Wt Readings from Last 3 Encounters:  12/27/19 204 lb (92.5 kg)  11/17/19 207 lb 12.8 oz (94.3 kg)  10/13/19 203 lb 4.8 oz (92.2 kg)     There are no preventive care reminders to display for this patient.  There are no preventive care reminders to display for this patient.  Lab Results  Component Value Date   TSH 2.460 07/08/2018   Lab Results  Component Value Date   WBC 3.9 10/06/2019   HGB 11.4 (L) 10/06/2019   HCT 32.8 (L) 10/06/2019   MCV 95.1 10/06/2019   PLT 213 10/06/2019   Lab Results  Component Value Date   NA 140 10/06/2019   K 3.6 10/06/2019   CO2 30 10/06/2019   GLUCOSE 94 10/06/2019   BUN 24 10/06/2019   CREATININE 3.90 (H) 10/06/2019   BILITOT 0.4 10/06/2019   ALKPHOS 92 09/20/2018   AST 12 10/06/2019   ALT 13 10/06/2019   PROT 7.2 10/06/2019   ALBUMIN 4.7 09/20/2018   CALCIUM 10.1 10/06/2019   ANIONGAP 11 06/14/2018   Lab Results  Component Value Date   CHOL 159 10/06/2019   Lab Results  Component Value Date   HDL 35 (L) 10/06/2019   Lab Results  Component Value Date   LDLCALC 103 (H) 10/06/2019   Lab Results  Component Value Date   TRIG 117 10/06/2019   Lab Results  Component Value Date   CHOLHDL 4.5  10/06/2019   Lab Results  Component Value Date   HGBA1C 5.1 10/31/2016      Assessment & Plan:   Problem List Items Addressed This Visit      Cardiovascular and Mediastinum   Hypertension - Primary   Relevant Orders   Urinalysis Dipstick    Hypertension secondary to other renal disorders Blood pressure is at goal on current medication regimen.  - Urinalysis Dipstick - Basic metabolic panel; Future - Basic metabolic panel  Chronic kidney disease (CKD), stage IV (severe) ([place]) Continue dialysis on Monday, Wednesday, and Friday.   Sleep apnea, unspecified type Continue nocturnal CPAP  Hypothyroidism, unspecified type - Thyroid Panel With TSH  Hypertensive heart disease with congestive heart failure, unspecified heart failure type ([place]) Follow up with cardiology as scheduled.    Folliculitis barbae Papules varying  in size and shape, primarily to right face. No erythema or drainage noted.   - Ambulatory referral to Dermatology  Chronic pain of right knee  - AMB referral to orthopedics  Follow-up: Return in about 4 months (around 04/27/2020).    [name]  APRN, MSN, FNP-C [facility] [address] [place], [place] [address] [phone]

## 2019-12-28 ENCOUNTER — Other Ambulatory Visit: Payer: Self-pay

## 2019-12-28 ENCOUNTER — Other Ambulatory Visit: Payer: Self-pay | Admitting: Family Medicine

## 2019-12-28 ENCOUNTER — Telehealth: Payer: Self-pay

## 2019-12-28 DIAGNOSIS — G4733 Obstructive sleep apnea (adult) (pediatric): Secondary | ICD-10-CM | POA: Diagnosis not present

## 2019-12-28 DIAGNOSIS — E039 Hypothyroidism, unspecified: Secondary | ICD-10-CM

## 2019-12-28 DIAGNOSIS — Z992 Dependence on renal dialysis: Secondary | ICD-10-CM | POA: Diagnosis not present

## 2019-12-28 DIAGNOSIS — M109 Gout, unspecified: Secondary | ICD-10-CM

## 2019-12-28 DIAGNOSIS — N186 End stage renal disease: Secondary | ICD-10-CM | POA: Diagnosis not present

## 2019-12-28 DIAGNOSIS — I10 Essential (primary) hypertension: Secondary | ICD-10-CM | POA: Diagnosis not present

## 2019-12-28 DIAGNOSIS — D689 Coagulation defect, unspecified: Secondary | ICD-10-CM | POA: Diagnosis not present

## 2019-12-28 DIAGNOSIS — E876 Hypokalemia: Secondary | ICD-10-CM | POA: Diagnosis not present

## 2019-12-28 DIAGNOSIS — N2581 Secondary hyperparathyroidism of renal origin: Secondary | ICD-10-CM | POA: Diagnosis not present

## 2019-12-28 LAB — BASIC METABOLIC PANEL
BUN/Creatinine Ratio: 10 (ref 9–20)
BUN: 42 mg/dL — ABNORMAL HIGH (ref 6–24)
CO2: 24 mmol/L (ref 20–29)
Calcium: 10.2 mg/dL (ref 8.7–10.2)
Chloride: 98 mmol/L (ref 96–106)
Creatinine, Ser: 4.03 mg/dL — ABNORMAL HIGH (ref 0.76–1.27)
GFR calc Af Amer: 18 mL/min/{1.73_m2} — ABNORMAL LOW (ref >59)
GFR calc non Af Amer: 16 mL/min/{1.73_m2} — ABNORMAL LOW (ref >59)
Glucose: 100 mg/dL — ABNORMAL HIGH (ref 65–99)
Potassium: 3.8 mmol/L (ref 3.5–5.2)
Sodium: 141 mmol/L (ref 134–144)

## 2019-12-28 LAB — THYROID PANEL WITH TSH
Free Thyroxine Index: 1.3 (ref 1.2–4.9)
T3 Uptake Ratio: 23 % — ABNORMAL LOW (ref 24–39)
T4, Total: 5.7 ug/dL (ref 4.5–12.0)
TSH: 2.58 u[IU]/mL (ref 0.450–4.500)

## 2019-12-28 MED ORDER — VITAMIN D (ERGOCALCIFEROL) 1.25 MG (50000 UNIT) PO CAPS: ORAL_CAPSULE | ORAL | 0 refills | Status: DC

## 2019-12-28 NOTE — Telephone Encounter (Signed)
-----   Message from [name],  sent at 12/28/2019  2:15 PM EST ----- Regarding: lab results Please inform patient that TSH is within a normal range. No medication changes are warranted at this time.  Follow up in clinic as scheduled.    [name]  APRN, MSN, FNP-C [facility] [address] [place], [place] [address] [phone]

## 2019-12-28 NOTE — Telephone Encounter (Signed)
Called and spoke with patient, advised that TSH was within normal range and no changes are needed at this time. Thanks!

## 2019-12-30 ENCOUNTER — Other Ambulatory Visit: Payer: Self-pay | Admitting: Cardiovascular Disease

## 2019-12-30 DIAGNOSIS — N186 End stage renal disease: Secondary | ICD-10-CM | POA: Diagnosis not present

## 2019-12-30 DIAGNOSIS — Z992 Dependence on renal dialysis: Secondary | ICD-10-CM | POA: Diagnosis not present

## 2019-12-30 DIAGNOSIS — N2581 Secondary hyperparathyroidism of renal origin: Secondary | ICD-10-CM | POA: Diagnosis not present

## 2019-12-30 DIAGNOSIS — D689 Coagulation defect, unspecified: Secondary | ICD-10-CM | POA: Diagnosis not present

## 2019-12-30 DIAGNOSIS — E876 Hypokalemia: Secondary | ICD-10-CM | POA: Diagnosis not present

## 2020-01-02 DIAGNOSIS — Z992 Dependence on renal dialysis: Secondary | ICD-10-CM | POA: Diagnosis not present

## 2020-01-02 DIAGNOSIS — E876 Hypokalemia: Secondary | ICD-10-CM | POA: Diagnosis not present

## 2020-01-02 DIAGNOSIS — D689 Coagulation defect, unspecified: Secondary | ICD-10-CM | POA: Diagnosis not present

## 2020-01-02 DIAGNOSIS — N2581 Secondary hyperparathyroidism of renal origin: Secondary | ICD-10-CM | POA: Diagnosis not present

## 2020-01-02 DIAGNOSIS — N186 End stage renal disease: Secondary | ICD-10-CM | POA: Diagnosis not present

## 2020-01-03 ENCOUNTER — Ambulatory Visit: Payer: Self-pay

## 2020-01-03 ENCOUNTER — Ambulatory Visit (INDEPENDENT_AMBULATORY_CARE_PROVIDER_SITE_OTHER): Payer: Medicare Other | Admitting: Orthopedic Surgery

## 2020-01-03 ENCOUNTER — Encounter: Payer: Self-pay | Admitting: Orthopedic Surgery

## 2020-01-03 ENCOUNTER — Other Ambulatory Visit: Payer: Self-pay

## 2020-01-03 VITALS — Ht 70.0 in | Wt 204.0 lb

## 2020-01-03 DIAGNOSIS — G8929 Other chronic pain: Secondary | ICD-10-CM | POA: Diagnosis not present

## 2020-01-03 DIAGNOSIS — M1711 Unilateral primary osteoarthritis, right knee: Secondary | ICD-10-CM

## 2020-01-03 DIAGNOSIS — M76821 Posterior tibial tendinitis, right leg: Secondary | ICD-10-CM | POA: Diagnosis not present

## 2020-01-03 DIAGNOSIS — M25561 Pain in right knee: Secondary | ICD-10-CM | POA: Diagnosis not present

## 2020-01-03 DIAGNOSIS — M25571 Pain in right ankle and joints of right foot: Secondary | ICD-10-CM | POA: Diagnosis not present

## 2020-01-03 MED ORDER — METHYLPREDNISOLONE ACETATE 40 MG/ML IJ SUSP
40.0000 mg | INTRAMUSCULAR | Status: AC | PRN
  Administered 2020-01-03: 14:00:00 40 mg via INTRA_ARTICULAR

## 2020-01-03 MED ORDER — PREDNISONE 10 MG PO TABS: 10.0000 mg | ORAL_TABLET | Freq: Every day | ORAL | 0 refills | Status: DC

## 2020-01-03 MED ORDER — LIDOCAINE HCL 1 % IJ SOLN
5.0000 mL | INTRAMUSCULAR | Status: AC | PRN
  Administered 2020-01-03: 5 mL

## 2020-01-03 NOTE — Addendum Note (Signed)
Addended by: Dondra Prader R on: 01/03/2020 02:07 PM   Modules accepted: Orders

## 2020-01-03 NOTE — Progress Notes (Signed)
Office Visit Note   Patient: Albert Eaton           Date of Birth: Feb 09, 1966           MRN: [id number] Visit Date: 01/03/2020              Requested by: [name], FNP [address],  [place] [address] PCP: [name], FNP  Chief Complaint  Patient presents with  . Right Knee - Pain  . Right Ankle - Pain      HPI: The patient is a 54 year old gentleman who presents today for 2 separate issues.  He has been having right-sided knee pain for many years its been gradually worsening unfortunately over the last few weeks it has not let off.  He is complaining of medial side pain he has had associated popping pain with ambulation especially with going down stairs or bending and stooping getting to it kneeling position.  He complains of start up stiffness stiffness no swelling no giving way  He is also been having right ankle pain this is medial side pain extending into his foot pain with ambulation he has had some mild swelling he does relate he does have a history of gout in his ankle his last uric acid was an 8.12 years ago he does take allopurinol twice daily.  He also has had a history of a right ankle fracture in 2010  He is a kidney patient is currently awaiting a transplant.  Assessment & Plan: Visit Diagnoses:  1. Chronic pain of right knee   2. Pain in right ankle and joints of right foot   3. Posterior tibial tendinitis, right leg   4. Primary osteoarthritis of right knee     Plan: Depo-Medrol injection of the right knee for osteoarthritis.  We will place him on low-dose prednisone for the next couple weeks placed him in a posterior tibial tendon brace as well deferred anti-inflammatories due to his kidneys  Follow-Up Instructions: No follow-ups on file.   Right Ankle Exam   Tenderness  Right ankle tenderness location: PTT. Swelling: none  Range of Motion  The patient has normal right ankle ROM.  Muscle Strength  The patient has  normal right ankle strength.  Other  Erythema: absent   Comments:  Pain with single limb heel raise   Right Knee Exam   Muscle Strength  The patient has normal right knee strength.  Tenderness  The patient is experiencing tenderness in the medial joint line.  Range of Motion  The patient has normal right knee ROM.  Tests  [place]:  Medial - negative Lateral - negative  Other  Erythema: absent Effusion: no effusion present      Patient is alert, oriented, no adenopathy, well-dressed, normal affect, normal respiratory effort.   Imaging: No results found. No images are attached to the encounter.  Labs: Lab Results  Component Value Date   HGBA1C 5.1 10/31/2016   ESRSEDRATE 60 (H) 01/01/2017   CRP 112.4 (H) 01/01/2017   LABURIC 8.1 (H) 03/30/2017   LABURIC 7.1 01/27/2017   LABURIC 8.7 (H) 01/01/2017   REPTSTATUS 06/12/2017 FINAL 06/07/2017   CULT NO GROWTH 5 DAYS 06/07/2017   LABORGA KLEBSIELLA PNEUMONIAE (A) 05/31/2017     Lab Results  Component Value Date   ALBUMIN 4.7 09/20/2018   ALBUMIN 4.6 07/08/2018   ALBUMIN 4.1 12/01/2017   LABURIC 8.1 (H) 03/30/2017   LABURIC 7.1 01/27/2017   LABURIC 8.7 (  H) 01/01/2017    Lab Results  Component Value Date   MG 2.3 06/13/2018   MG 1.6 (L) 06/08/2017   MG 2.2 01/06/2017   Lab Results  Component Value Date   VD25OH 14.9 (L) 07/08/2018    No results found for: PREALBUMIN CBC EXTENDED Latest Ref Rng & Units 10/06/2019 11/22/2018 09/20/2018  WBC 3.8 - 10.8 Thousand/uL 3.9 6.0 6.0  RBC 4.20 - 5.80 Million/uL 3.45(L) 4.18(L) 3.71(L)  HGB 13.2 - 17.1 g/dL 11.4(L) 12.5(L) 11.3(L)  HCT 38.5 - 50.0 % 32.8(L) 36.3(L) 32.8(L)  PLT 140 - 400 Thousand/uL 213 283 301  NEUTROABS 1.4 - 7.0 x10E3/uL - - 2.8  LYMPHSABS 0.7 - 3.1 x10E3/uL - - 2.2     Body mass index is 29.27 kg/m.  Orders:  Orders Placed This Encounter  Procedures  . XR Knee 1-2 Views Right  . XR Ankle 2 Views Right   No orders of the  defined types were placed in this encounter.    Procedures: Large Joint Inj: R knee on 01/03/2020 1:54 PM Indications: pain and diagnostic evaluation Details: 22 G 1.5 in needle  Arthrogram: No  Medications: 40 mg methylPREDNISolone acetate 40 MG/ML; 5 mL lidocaine 1 % Outcome: tolerated well, no immediate complications Procedure, treatment alternatives, risks and benefits explained, specific risks discussed. Consent was given by the patient. Immediately prior to procedure a time out was called to verify the correct patient, procedure, equipment, support staff and site/side marked as required. Patient was prepped and draped in the usual sterile fashion.      Clinical Data: No additional findings.  ROS:  All other systems negative, except as noted in the HPI. Review of Systems  Constitutional: Negative for chills and fever.  Musculoskeletal: Positive for arthralgias and myalgias. Negative for joint swelling.  Neurological: Negative for weakness and numbness.    Objective: Vital Signs: Ht 5' 10"  (1.778 m)   Wt 204 lb (92.5 kg)   BMI 29.27 kg/m   Specialty Comments:  No specialty comments available.  PMFS History: Patient Active Problem List   Diagnosis Date Noted  . Pulsatile tinnitus of right ear 11/21/2019  . Kidney failure 11/21/2019  . CHF (congestive heart failure) ([place]) 11/21/2019  . Chills (without fever) 11/07/2019  . Hypercalcemia 10/17/2019  . Dependence on renal dialysis ([place]) 06/06/2019  . Coagulation defect, unspecified ([place]) 05/23/2019  . Iron deficiency anemia, unspecified 05/16/2019  . Anemia in other chronic diseases classified elsewhere 05/13/2019  . Arteriovenous fistula, acquired ([place]) 05/13/2019  . Body mass index (BMI) 28.0-28.9, adult 05/13/2019  . Crohn's disease of small intestine without complications ([place]) 88/28/0034  . Encounter for screening for respiratory tuberculosis 05/13/2019  . Gout, unspecified 05/13/2019  . Nausea 05/13/2019  .  Other fatigue 05/13/2019  . Secondary hyperparathyroidism of renal origin ([place]) 05/13/2019  . End stage renal disease ([place]) 05/13/2019  . Chronic diastolic (congestive) heart failure ([place]) 05/13/2019  . Chronic kidney disease, unspecified 05/13/2019  . Hypertensive chronic kidney disease with stage 1 through stage 4 chronic kidney disease, or unspecified chronic kidney disease 05/13/2019  . Obstructive sleep apnea 05/13/2019  . Discomfort of right ear 02/18/2019  . Chronic kidney disease (CKD), stage IV (severe) ([place]) 06/15/2018  . Hypokalemia 06/13/2018  . Abnormal CT scan, small bowel   . Perennial allergic rhinitis with a predominant nonallergic component 02/09/2018  . Allergic conjunctivitis 02/09/2018  . Mild intermittent asthma 02/09/2018  . Atherosclerosis 12/09/2017  . Hypothyroidism 11/19/2017  . Mixed hyperlipidemia 10/13/2017  .  Personal history of gout 10/13/2017  . S/P cholecystectomy 10/13/2017  . Congestive heart failure with LV diastolic dysfunction, NYHA class 1 ([place]) 10/13/2017  . Hypothyroidism (acquired) 10/13/2017  . 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 SOB (shortness of breath) 06/07/2017  . Arthritis, gouty 01/27/2017  . Chronic diastolic CHF (congestive heart failure) ([place]) 01/06/2017  . OSA (obstructive sleep apnea) 01/06/2017  . Abnormal electrocardiogram (ECG) (EKG) 10/31/2016  . Chest pain 10/09/2016  . HIV disease ([place]) 10/09/2016  . Hypertensive heart disease with CHF (congestive heart failure) ([place]) 10/09/2016  . Chronic kidney disease, stage 4 (severe) ([place]) 05/14/2016  . Left-sided low back pain without sciatica 05/29/2015  . NYHA class 2 heart failure with preserved ejection fraction ([place]) 05/15/2015  . PAH (pulmonary artery hypertension) ([place]) 05/08/2015  . Hypertension 05/04/2015  . Non-rheumatic mitral regurgitation 05/04/2015  . Prolonged Q-T interval on ECG 05/04/2015  . Human immunodeficiency virus (HIV) disease ([place]) 05/04/2015    Past Medical History:  Diagnosis Date  . Anemia   . Aortic atherosclerosis ([place])   . Asthma   . Chronic diastolic CHF (congestive heart failure) ([place]) 01/06/2017   Echo 7/16 [facility] in [place], [name]) Mild AI, mild LAE, mild concentric LVH, EF 55, normal wall motion, mild to moderate MR, mild PI, RVSP 55 // Echo 10/09/16 ([facility]):  Moderate LVH, grade 2 diastolic dysfunction, mild MR, moderate LAE   . CKD (chronic kidney disease)    per pt, waiting on a kidney transplant in near future.  . Esophagitis   . Gout   . Hepatitis    Hep B  . History of nuclear stress test    a. Nuc study 7/16: no scar or ischemia, EF 42 // b. Nuc study 12/17: EF 48, ?small apical ischemia, Low Risk  . HIV (human immunodeficiency virus infection) ([place])   . HLD (hyperlipidemia)   . Hypertension   . Hypertensive heart disease with CHF (congestive heart failure) ([place]) 10/09/2016  . Mitral regurgitation   . OSA (obstructive sleep apnea)   . Post-operative nausea and vomiting    1 time as a child  . Sleep apnea    uses c-pap  . Wears glasses   . Wears partial dentures     Family History  Problem Relation Age of Onset  . Heart failure Mother   . Heart attack Maternal Grandmother 53  . Hypertension Sister   . Multiple sclerosis Sister   . Hypertension Brother   . Hypertension Sister   . Scoliosis Other   . Allergic rhinitis Neg Hx   . Angioedema Neg Hx   . Asthma Neg Hx   . Eczema Neg Hx   . Immunodeficiency Neg Hx   . Urticaria Neg Hx     Past Surgical History:  Procedure Laterality Date  . AV FISTULA PLACEMENT Left 07/02/2018   Procedure: Creation of Left arm BRACHIOBASILIC ARTERIOVENOUS  FISTULA;  Surgeon: Marty Heck, MD;  Location: [facility];  Service: Vascular;  Laterality: Left;  . BASCILIC VEIN TRANSPOSITION Left 08/27/2018   Procedure: Left arm BRACHIOBASILIC VEIN TRANSPOSITION SECOND STAGE;  Surgeon: Marty Heck, MD;  Location: [place];  Service: Vascular;  Laterality: Left;   . CHOLECYSTECTOMY    . COLONOSCOPY W/ BIOPSIES AND POLYPECTOMY    . GIVENS CAPSULE STUDY N/A 03/01/2018   Procedure: GIVENS CAPSULE STUDY;  Surgeon: Jerene Bears, MD;  Location: [facility];  Service: Gastroenterology;  Laterality: N/A;  . HERNIA REPAIR  As baby  . MULTIPLE TOOTH EXTRACTIONS    . NOSE SURGERY    . RENAL BIOPSY     Social History   Occupational History  . Not on file  Tobacco Use  . Smoking status: Former Smoker    Types: Cigarettes    Quit date: 2000    Years since quitting: 21.2  . Smokeless tobacco: Never Used  Substance and Sexual Activity  . Alcohol use: Not Currently    Alcohol/week: 2.0 standard drinks    Types: 2 Shots of liquor per week  . Drug use: No  . Sexual activity: Never    Partners: Male    Birth control/protection: Condom    Comment: declined condoms

## 2020-01-04 DIAGNOSIS — N2581 Secondary hyperparathyroidism of renal origin: Secondary | ICD-10-CM | POA: Diagnosis not present

## 2020-01-04 DIAGNOSIS — Z992 Dependence on renal dialysis: Secondary | ICD-10-CM | POA: Diagnosis not present

## 2020-01-04 DIAGNOSIS — E876 Hypokalemia: Secondary | ICD-10-CM | POA: Diagnosis not present

## 2020-01-04 DIAGNOSIS — N186 End stage renal disease: Secondary | ICD-10-CM | POA: Diagnosis not present

## 2020-01-04 DIAGNOSIS — D689 Coagulation defect, unspecified: Secondary | ICD-10-CM | POA: Diagnosis not present

## 2020-01-05 ENCOUNTER — Ambulatory Visit: Payer: Medicare Other | Admitting: Cardiovascular Disease

## 2020-01-06 DIAGNOSIS — N2581 Secondary hyperparathyroidism of renal origin: Secondary | ICD-10-CM | POA: Diagnosis not present

## 2020-01-06 DIAGNOSIS — D689 Coagulation defect, unspecified: Secondary | ICD-10-CM | POA: Diagnosis not present

## 2020-01-06 DIAGNOSIS — Z992 Dependence on renal dialysis: Secondary | ICD-10-CM | POA: Diagnosis not present

## 2020-01-06 DIAGNOSIS — N186 End stage renal disease: Secondary | ICD-10-CM | POA: Diagnosis not present

## 2020-01-06 DIAGNOSIS — E876 Hypokalemia: Secondary | ICD-10-CM | POA: Diagnosis not present

## 2020-01-11 DIAGNOSIS — D689 Coagulation defect, unspecified: Secondary | ICD-10-CM | POA: Diagnosis not present

## 2020-01-11 DIAGNOSIS — N2581 Secondary hyperparathyroidism of renal origin: Secondary | ICD-10-CM | POA: Diagnosis not present

## 2020-01-11 DIAGNOSIS — N186 End stage renal disease: Secondary | ICD-10-CM | POA: Diagnosis not present

## 2020-01-11 DIAGNOSIS — E876 Hypokalemia: Secondary | ICD-10-CM | POA: Diagnosis not present

## 2020-01-11 DIAGNOSIS — Z992 Dependence on renal dialysis: Secondary | ICD-10-CM | POA: Diagnosis not present

## 2020-01-12 ENCOUNTER — Other Ambulatory Visit: Payer: Self-pay

## 2020-01-12 ENCOUNTER — Ambulatory Visit (INDEPENDENT_AMBULATORY_CARE_PROVIDER_SITE_OTHER): Payer: Medicare Other | Admitting: Cardiovascular Disease

## 2020-01-12 ENCOUNTER — Encounter: Payer: Self-pay | Admitting: Cardiovascular Disease

## 2020-01-12 VITALS — BP 142/88 | HR 69 | Ht 70.0 in | Wt 200.8 lb

## 2020-01-12 DIAGNOSIS — I5032 Chronic diastolic (congestive) heart failure: Secondary | ICD-10-CM | POA: Diagnosis not present

## 2020-01-12 DIAGNOSIS — N186 End stage renal disease: Secondary | ICD-10-CM

## 2020-01-12 DIAGNOSIS — I110 Hypertensive heart disease with heart failure: Secondary | ICD-10-CM | POA: Diagnosis not present

## 2020-01-12 DIAGNOSIS — R079 Chest pain, unspecified: Secondary | ICD-10-CM

## 2020-01-12 NOTE — Patient Instructions (Signed)
Medication Instructions:  Your provider recommends that you continue on your current medications as directed. Please refer to the Current Medication list given to you today.   *If you need a refill on your cardiac medications before your next appointment, please call your pharmacy*  Testing/Procedures: Your provider has requested that you have an echocardiogram. Echocardiography is a painless test that uses sound waves to create images of your heart. It provides your doctor with information about the size and shape of your heart and how well your heart's chambers and valves are working. This procedure takes approximately one hour. There are no restrictions for this procedure.    Your provider has requested that you have a lexiscan myoview. For further information please visit [email or web address]. Please follow instruction sheet, as given.  Follow-Up: At [facility], you and your health needs are our priority.  As part of our continuing mission to provide you with exceptional heart care, we have created designated Provider Care Teams.  These Care Teams include your primary Cardiologist (physician) and Advanced Practice Providers (APPs -  Physician Assistants and Nurse Practitioners) who all work together to provide you with the care you need, when you need it. Your next appointment:   12 month(s) The format for your next appointment:   In Person Provider:   You may see [name], MD or one of the following Advanced Practice Providers on your designated Care Team:    Richardson Dopp, PA-C  [name] [place], [place]  Daune Perch, [place]

## 2020-01-12 NOTE — Progress Notes (Signed)
Cardiology Office Note:    Date:  01/12/2020   ID:  Albert Eaton, DOB 09/23/1966, MRN [id number]  PCP:  [name], FNP  Cardiologist:  [name], MD  Electrophysiologist:  None   Referring MD: [name], FNP   Chief Complaint  Patient presents with  . Shortness of Breath    History of Present Illness:    Albert Eaton is a 54 y.o. male with a hx of advanced chronic kidney disease, chronic diastolic heart failure, mitral regurgitation, HIV, and obstructive sleep apnea, presenting for follow-up evaluation.  I initially met him in 2017 when he presented with hypertensive urgency, acute diastolic heart failure, and elevated troponin.  He was managed medically at that time and has done reasonably well on medical therapy over the last several years.  He progressed to ESRD in July 2020 and has been on HD since that time. He dialyzes on a M/W/F schedule. He has episodic chest pain in the center of his chest. He denies exertional chest pain, but he does experience shortness of breath with exertion. He uses CPAP at night, denies orthopnea or PND. Swelling is improved since starting dialysis. He remains on multiple antihypertensive medications including amlodipine, carvedilol, clonidine, and hydralazine.  He feels very washed out after dialysis, but he does not relate chest pain episodes to dialysis sessions.  Past Medical History:  Diagnosis Date  . Anemia   . Aortic atherosclerosis ([place])   . Asthma   . Chronic diastolic CHF (congestive heart failure) ([place]) 01/06/2017   Echo 7/16 [facility] in [place], [name]) Mild AI, mild LAE, mild concentric LVH, EF 55, normal wall motion, mild to moderate MR, mild PI, RVSP 55 // Echo 10/09/16 ([facility]):  Moderate LVH, grade 2 diastolic dysfunction, mild MR, moderate LAE   . CKD (chronic kidney disease)    per pt, waiting on a kidney transplant in near future.  . Esophagitis   . Gout   . Hepatitis    Hep B  . History of nuclear  stress test    a. Nuc study 7/16: no scar or ischemia, EF 42 // b. Nuc study 12/17: EF 48, ?small apical ischemia, Low Risk  . HIV (human immunodeficiency virus infection) ([place])   . HLD (hyperlipidemia)   . Hypertension   . Hypertensive heart disease with CHF (congestive heart failure) ([place]) 10/09/2016  . Mitral regurgitation   . OSA (obstructive sleep apnea)   . Post-operative nausea and vomiting    1 time as a child  . Sleep apnea    uses c-pap  . Wears glasses   . Wears partial dentures     Past Surgical History:  Procedure Laterality Date  . AV FISTULA PLACEMENT Left 07/02/2018   Procedure: Creation of Left arm BRACHIOBASILIC ARTERIOVENOUS  FISTULA;  Surgeon: Marty Heck, MD;  Location: [facility];  Service: Vascular;  Laterality: Left;  . BASCILIC VEIN TRANSPOSITION Left 08/27/2018   Procedure: Left arm BRACHIOBASILIC VEIN TRANSPOSITION SECOND STAGE;  Surgeon: Marty Heck, MD;  Location: [facility];  Service: Vascular;  Laterality: Left;  . CHOLECYSTECTOMY    . COLONOSCOPY W/ BIOPSIES AND POLYPECTOMY    . GIVENS CAPSULE STUDY N/A 03/01/2018   Procedure: GIVENS CAPSULE STUDY;  Surgeon: Jerene Bears, MD;  Location: [place];  Service: Gastroenterology;  Laterality: N/A;  . HERNIA REPAIR     As baby  . MULTIPLE TOOTH EXTRACTIONS    . NOSE SURGERY    . RENAL BIOPSY  Current Medications: Current Meds  Medication Sig  . abacavir (ZIAGEN) 300 MG tablet Take 1 tablet (300 mg total) by mouth daily.  Marland Kitchen albuterol (PROAIR HFA) 108 (90 Base) MCG/ACT inhaler Inhale 2 puffs into the lungs every 4 (four) hours as needed for wheezing or shortness of breath.  . allopurinol (ZYLOPRIM) 100 MG tablet TAKE 1 TABLET BY MOUTH TWICE DAILY  . amLODipine (NORVASC) 10 MG tablet Take 1 tablet (10 mg total) by mouth daily.  . Ascorbic Acid (VITAMIN C WITH ROSE HIPS) 1000 MG tablet Take 1,000 mg by mouth daily.  Marland Kitchen azelastine (ASTELIN) 0.1 % nasal spray Place 2 sprays into both nostrils 2  (two) times daily as needed for rhinitis. Use in each nostril as directed  . B Complex-C-Zn-Folic Acid (DIALYVITE/ZINC) TABS TK 1 T PO QPM  . carvedilol (COREG) 25 MG tablet TAKE 1 TABLET BY MOUTH TWICE DAILY WITH FOOD  . cloNIDine (CATAPRES) 0.1 MG tablet TAKE 1 TABLET(0.1 MG) BY MOUTH TWICE DAILY  . famotidine (PEPCID) 20 MG tablet TAKE 1 TABLET(20 MG) BY MOUTH TWICE DAILY  . fluticasone (FLOVENT HFA) 110 MCG/ACT inhaler Inhale 2 puffs into the lungs 2 (two) times daily.  Marland Kitchen gabapentin (NEURONTIN) 300 MG capsule TAKE 1 CAPSULE(300 MG) BY MOUTH THREE TIMES DAILY  . hydrALAZINE (APRESOLINE) 100 MG tablet Take 1 tablet (100 mg total) by mouth 3 (three) times daily. Please make yearly appt with Dr. [name] for [facility] for future refills. 1st attempt  . hyoscyamine (LEVSIN SL) 0.125 MG SL tablet Place 1-2 tablets (0.125-0.25 mg total) under the tongue every 8 (eight) hours as needed (lower abdominal pain, spasm).  Marland Kitchen ipratropium (ATROVENT) 0.06 % nasal spray 2 sprays per nostril 2 to 3 times daily as needed  . lamivudine (EPIVIR) 100 MG tablet TAKE 1 TABLET(100 MG) BY MOUTH DAILY  . levothyroxine (SYNTHROID) 25 MCG tablet TAKE 1 TABLET BY MOUTH DAILY BEFORE BREAKFAST  . lidocaine-prilocaine (EMLA) cream APPLY SMALL AMOUNT TO ACCESS SITE (AVF) 1 TO 2 HOURS BEFORE DIALYSIS. COVER WITH OCCLUSIVE DRESSING (SARAN WRAP)  . lubiprostone (AMITIZA) 24 MCG capsule Take 1 capsule (24 mcg total) by mouth 2 (two) times daily with a meal.  . Olopatadine HCl (PAZEO) 0.7 % SOLN Place 1 drop into both eyes daily.  . predniSONE (DELTASONE) 10 MG tablet Take 1 tablet (10 mg total) by mouth daily with breakfast.  . raltegravir (ISENTRESS) 400 MG tablet Take 1 tablet (400 mg total) by mouth 2 (two) times daily.  Marland Kitchen Spacer/Aero-Holding Chambers DEVI 1 Device by Does not apply route 2 (two) times a day.  . Turmeric 500 MG CAPS Take 500 mg by mouth daily.  . Vitamin D, Ergocalciferol, (DRISDOL) 1.25 MG (50000 UNIT) CAPS capsule  TAKE 1 CAPSULE BY MOUTH EVERY 7 DAYS  . vitamin E (VITAMIN E) 180 MG (400 UNITS) capsule Take 400 Units by mouth daily.  Marland Kitchen zinc gluconate 50 MG tablet Take 50 mg by mouth daily.      Allergies:   Ace inhibitors and Lisinopril   Social History   Socioeconomic History  . Marital status: Single    Spouse name: Not on file  . Number of children: Not on file  . Years of education: Not on file  . Highest education level: Not on file  Occupational History  . Not on file  Tobacco Use  . Smoking status: Former Smoker    Types: Cigarettes    Quit date: 2000    Years since quitting: 21.2  .  Smokeless tobacco: Never Used  Substance and Sexual Activity  . Alcohol use: Not Currently    Alcohol/week: 2.0 standard drinks    Types: 2 Shots of liquor per week  . Drug use: No  . Sexual activity: Never    Partners: Male    Birth control/protection: Condom    Comment: declined condoms  Other Topics Concern  . Not on file  Social History Narrative  . Not on file   Social Determinants of Health   Financial Resource Strain:   . Difficulty of Paying Living Expenses:   Food Insecurity:   . Worried About Charity fundraiser in the Last Year:   . Arboriculturist in the Last Year:   Transportation Needs:   . Film/video editor (Medical):   Marland Kitchen Lack of Transportation (Non-Medical):   Physical Activity:   . Days of Exercise per Week:   . Minutes of Exercise per Session:   Stress:   . Feeling of Stress :   Social Connections:   . Frequency of Communication with Friends and Family:   . Frequency of Social Gatherings with Friends and Family:   . Attends Religious Services:   . Active Member of Clubs or Organizations:   . Attends Archivist Meetings:   Marland Kitchen Marital Status:      Family History: The patient's family history includes Heart attack (age of onset: 31) in his maternal grandmother; Heart failure in his mother; Hypertension in his brother, sister, and sister; Multiple  sclerosis in his sister; Scoliosis in an other family member. There is no history of Allergic rhinitis, Angioedema, Asthma, Eczema, Immunodeficiency, or Urticaria.  ROS:   Please see the history of present illness.    All other systems reviewed and are negative.  EKGs/Labs/Other Studies Reviewed:    The following studies were reviewed today: Echo 11/19/2017: Study Conclusions   - Left ventricle: The cavity size was normal. There was severe  focal basal and moderate concentric hypertrophy. Systolic  function was normal. The estimated ejection fraction was in the  range of 60% to 65%. Wall motion was normal; there were no  regional wall motion abnormalities. Features are consistent with  a pseudonormal left ventricular filling pattern, with concomitant  abnormal relaxation and increased filling pressure (grade 2  diastolic dysfunction).  - Aortic valve: There was trivial regurgitation.  - Aorta: Aortic root dimension: 39 mm (ED). Ascending aortic  diameter: 40 mm (S).  - Aortic root: The aortic root was mildly dilated.  - Ascending aorta: The ascending aorta was mildly dilated.  - Mitral valve: There was mild to moderate regurgitation directed  eccentrically and posteriorly.  - Left atrium: The atrium was severely dilated.  - Right ventricle: The cavity size was moderately dilated. Wall  thickness was normal.  - Pulmonary arteries: PA peak pressure: 35 mm Hg (S).   EKG:  EKG is ordered today.  The ekg ordered today demonstrates normal sinus rhythm 64 bpm, within normal limits.  Recent Labs: 10/06/2019: ALT 13; Hemoglobin 11.4; Platelets 213 12/27/2019: BUN 42; Creatinine, Ser 4.03; Potassium 3.8; Sodium 141; TSH 2.580  Recent Lipid Panel    Component Value Date/Time   CHOL 159 10/06/2019 1018   CHOL 180 12/09/2017 0936   TRIG 117 10/06/2019 1018   HDL 35 (L) 10/06/2019 1018   HDL 34 (L) 12/09/2017 0936   CHOLHDL 4.5 10/06/2019 1018   VLDL 29 05/25/2017  0953   LDLCALC 103 (H) 10/06/2019 1018  Physical Exam:    VS:  BP (!) 142/88   Pulse 69   Ht 5' 10"  (1.778 m)   Wt 200 lb 12.8 oz (91.1 kg)   SpO2 98%   BMI 28.81 kg/m     Wt Readings from Last 3 Encounters:  01/12/20 200 lb 12.8 oz (91.1 kg)  01/03/20 204 lb (92.5 kg)  12/27/19 204 lb (92.5 kg)    GEN:  Well nourished, well developed in no acute distress HEENT: Normal NECK: No JVD; No carotid bruits LYMPHATICS: No lymphadenopathy CARDIAC: RRR, 2/6 systolic murmur at the LLSB RESPIRATORY:  Clear to auscultation without rales, wheezing or rhonchi  ABDOMEN: Soft, non-tender, non-distended MUSCULOSKELETAL:  No edema; No deformity  SKIN: Warm and dry NEUROLOGIC:  Alert and oriented x 3 PSYCHIATRIC:  Normal affect   ASSESSMENT:    1. Chest pain at rest   2. Chronic diastolic CHF (congestive heart failure) ([place])   3. Hypertensive heart disease with congestive heart failure, unspecified heart failure type ([place])   4. ESRD (end stage renal disease) ([place])    PLAN:    In order of problems listed above:  1. The patient has somewhat atypical chest pain but multiple CV risk factors.  He had elevated troponin at the time of his initial evaluation and we avoided catheter angiography because of his kidney disease.  The patient is now progressed to end-stage renal disease.  I think it is reasonable to proceed with a Lexiscan Myoview stress test to evaluate for significant ischemia. 2. The patient has New York Heart Association functional class II symptoms.  His medical program is reviewed.  Will check an echocardiogram.  He tells me that there are several family members who have been diagnosed with hypertrophic cardiomyopathy. 3. Longstanding hypertension.  Blood pressure seems to be reasonably well controlled.  Multidrug regimen is reviewed today. 4. Now on Monday, Wednesday, Friday hemodialysis.   Medication Adjustments/Labs and Tests Ordered: Current medicines are reviewed at  length with the patient today.  Concerns regarding medicines are outlined above.  Orders Placed This Encounter  Procedures  . MYOCARDIAL PERFUSION IMAGING  . EKG 12-Lead  . ECHOCARDIOGRAM COMPLETE   No orders of the defined types were placed in this encounter.   Patient Instructions  Medication Instructions:  Your provider recommends that you continue on your current medications as directed. Please refer to the Current Medication list given to you today.   *If you need a refill on your cardiac medications before your next appointment, please call your pharmacy*  Testing/Procedures: Your provider has requested that you have an echocardiogram. Echocardiography is a painless test that uses sound waves to create images of your heart. It provides your doctor with information about the size and shape of your heart and how well your heart's chambers and valves are working. This procedure takes approximately one hour. There are no restrictions for this procedure.    Your provider has requested that you have a lexiscan myoview. For further information please visit [email or web address]. Please follow instruction sheet, as given.  Follow-Up: At [facility], you and your health needs are our priority.  As part of our continuing mission to provide you with exceptional heart care, we have created designated Provider Care Teams.  These Care Teams include your primary Cardiologist (physician) and Advanced Practice Providers (APPs -  Physician Assistants and Nurse Practitioners) who all work together to provide you with the care you need, when you need it. Your next appointment:   12 month(s)  The format for your next appointment:   In Person Provider:   You may see [name], MD or one of the following Advanced Practice Providers on your designated Care Team:    Richardson Dopp, PA-C  [name] [facility], PA-C  Daune Perch, [place]      Signed, [name], MD  01/12/2020 2:31 PM    [place]

## 2020-01-13 DIAGNOSIS — D689 Coagulation defect, unspecified: Secondary | ICD-10-CM | POA: Diagnosis not present

## 2020-01-13 DIAGNOSIS — E876 Hypokalemia: Secondary | ICD-10-CM | POA: Diagnosis not present

## 2020-01-13 DIAGNOSIS — N186 End stage renal disease: Secondary | ICD-10-CM | POA: Diagnosis not present

## 2020-01-13 DIAGNOSIS — Z992 Dependence on renal dialysis: Secondary | ICD-10-CM | POA: Diagnosis not present

## 2020-01-13 DIAGNOSIS — N2581 Secondary hyperparathyroidism of renal origin: Secondary | ICD-10-CM | POA: Diagnosis not present

## 2020-01-16 DIAGNOSIS — N186 End stage renal disease: Secondary | ICD-10-CM | POA: Diagnosis not present

## 2020-01-16 DIAGNOSIS — N2581 Secondary hyperparathyroidism of renal origin: Secondary | ICD-10-CM | POA: Diagnosis not present

## 2020-01-16 DIAGNOSIS — E876 Hypokalemia: Secondary | ICD-10-CM | POA: Diagnosis not present

## 2020-01-16 DIAGNOSIS — D689 Coagulation defect, unspecified: Secondary | ICD-10-CM | POA: Diagnosis not present

## 2020-01-16 DIAGNOSIS — Z992 Dependence on renal dialysis: Secondary | ICD-10-CM | POA: Diagnosis not present

## 2020-01-17 ENCOUNTER — Other Ambulatory Visit: Payer: Self-pay

## 2020-01-17 ENCOUNTER — Ambulatory Visit (INDEPENDENT_AMBULATORY_CARE_PROVIDER_SITE_OTHER): Payer: Medicare Other | Admitting: Physician Assistant

## 2020-01-17 ENCOUNTER — Encounter: Payer: Self-pay | Admitting: Physician Assistant

## 2020-01-17 ENCOUNTER — Ambulatory Visit: Payer: Medicare Other

## 2020-01-17 DIAGNOSIS — B078 Other viral warts: Secondary | ICD-10-CM | POA: Diagnosis not present

## 2020-01-17 DIAGNOSIS — Z1283 Encounter for screening for malignant neoplasm of skin: Secondary | ICD-10-CM | POA: Diagnosis not present

## 2020-01-17 DIAGNOSIS — D485 Neoplasm of uncertain behavior of skin: Secondary | ICD-10-CM | POA: Diagnosis not present

## 2020-01-17 DIAGNOSIS — D492 Neoplasm of unspecified behavior of bone, soft tissue, and skin: Secondary | ICD-10-CM

## 2020-01-17 NOTE — Progress Notes (Signed)
   New Patient   Subjective  Albert Eaton is a 54 y.o. male who presents for the following: Skin Problem (right jawline -new growth for few months- itches and sometimes painful). No treatment, no drainage, persistent. Also has an older lesion on his left side scalp and left cheek that are hard knots also.    The following portions of the chart were reviewed this encounter and updated as appropriate: Tobacco  Allergies  Meds  Problems  Med Hx  Surg Hx  Fam Hx      Objective  Well appearing patient in no apparent distress; mood and affect are within normal limits.  All skin waist up examined.  Objective  Right Buccal Cheek : Hyperkeratotic papule with scale 5-0 nylon x 2     Objective  face: No signs of NMSC.  Assessment & Plan  Neoplasm of skin Right Buccal Cheek   Skin / nail biopsy Type of biopsy: punch   Procedure prep:  Patient was prepped and draped in usual sterile fashion (nonsterile) Prep type:  Chlorhexidine Anesthesia: the lesion was anesthetized in a standard fashion   Anesthetic:  1% lidocaine w/ epinephrine 1-100,000 local infiltration Punch size:  4 mm Suture size:  5-0 Suture type: nylon   Suture removal (days):  7 Hemostasis achieved with: suture   Outcome: patient tolerated procedure well   Post-procedure details: wound care instructions given   Post-procedure details comment:  Nonsterile  Specimen 1 - Surgical pathology Differential Diagnosis: r/o cyst Check Margins: No  Screening exam for skin cancer face No atypical nevi noted at the time of the visit.

## 2020-01-17 NOTE — Patient Instructions (Signed)

## 2020-01-18 DIAGNOSIS — D689 Coagulation defect, unspecified: Secondary | ICD-10-CM | POA: Diagnosis not present

## 2020-01-18 DIAGNOSIS — Z992 Dependence on renal dialysis: Secondary | ICD-10-CM | POA: Diagnosis not present

## 2020-01-18 DIAGNOSIS — N2581 Secondary hyperparathyroidism of renal origin: Secondary | ICD-10-CM | POA: Diagnosis not present

## 2020-01-18 DIAGNOSIS — N186 End stage renal disease: Secondary | ICD-10-CM | POA: Diagnosis not present

## 2020-01-18 DIAGNOSIS — E876 Hypokalemia: Secondary | ICD-10-CM | POA: Diagnosis not present

## 2020-01-19 ENCOUNTER — Telehealth: Payer: Self-pay

## 2020-01-19 NOTE — Telephone Encounter (Signed)
-----   Message from Warren Danes, [place] sent at 01/19/2020  9:51 AM EDT ----- Skin , right buccal cheek VERRUCA VULGARIS, IRRITATED  RTC PRN

## 2020-01-19 NOTE — Telephone Encounter (Signed)
Path reports to patient.  Has follow-up appointment for suture removal.

## 2020-01-20 DIAGNOSIS — D689 Coagulation defect, unspecified: Secondary | ICD-10-CM | POA: Diagnosis not present

## 2020-01-20 DIAGNOSIS — E876 Hypokalemia: Secondary | ICD-10-CM | POA: Diagnosis not present

## 2020-01-20 DIAGNOSIS — Z992 Dependence on renal dialysis: Secondary | ICD-10-CM | POA: Diagnosis not present

## 2020-01-20 DIAGNOSIS — N186 End stage renal disease: Secondary | ICD-10-CM | POA: Diagnosis not present

## 2020-01-20 DIAGNOSIS — N2581 Secondary hyperparathyroidism of renal origin: Secondary | ICD-10-CM | POA: Diagnosis not present

## 2020-01-23 ENCOUNTER — Other Ambulatory Visit: Payer: Self-pay

## 2020-01-23 DIAGNOSIS — M109 Gout, unspecified: Secondary | ICD-10-CM

## 2020-01-23 DIAGNOSIS — E039 Hypothyroidism, unspecified: Secondary | ICD-10-CM

## 2020-01-23 MED ORDER — LEVOTHYROXINE SODIUM 25 MCG PO TABS: ORAL_TABLET | ORAL | 3 refills | Status: DC

## 2020-01-23 MED ORDER — ALLOPURINOL 100 MG PO TABS: 100.0000 mg | ORAL_TABLET | Freq: Two times a day (BID) | ORAL | 3 refills | Status: DC

## 2020-01-24 ENCOUNTER — Ambulatory Visit: Payer: Medicare Other | Admitting: Physician Assistant

## 2020-01-25 DIAGNOSIS — N186 End stage renal disease: Secondary | ICD-10-CM | POA: Diagnosis not present

## 2020-01-25 DIAGNOSIS — D689 Coagulation defect, unspecified: Secondary | ICD-10-CM | POA: Diagnosis not present

## 2020-01-25 DIAGNOSIS — Z992 Dependence on renal dialysis: Secondary | ICD-10-CM | POA: Diagnosis not present

## 2020-01-25 DIAGNOSIS — E876 Hypokalemia: Secondary | ICD-10-CM | POA: Diagnosis not present

## 2020-01-25 DIAGNOSIS — I129 Hypertensive chronic kidney disease with stage 1 through stage 4 chronic kidney disease, or unspecified chronic kidney disease: Secondary | ICD-10-CM | POA: Diagnosis not present

## 2020-01-25 DIAGNOSIS — N2581 Secondary hyperparathyroidism of renal origin: Secondary | ICD-10-CM | POA: Diagnosis not present

## 2020-01-26 ENCOUNTER — Other Ambulatory Visit: Payer: Self-pay

## 2020-01-26 ENCOUNTER — Ambulatory Visit (INDEPENDENT_AMBULATORY_CARE_PROVIDER_SITE_OTHER): Payer: Medicare Other

## 2020-01-26 ENCOUNTER — Telehealth (HOSPITAL_COMMUNITY): Payer: Self-pay

## 2020-01-26 DIAGNOSIS — B079 Viral wart, unspecified: Secondary | ICD-10-CM

## 2020-01-26 DIAGNOSIS — Z4802 Encounter for removal of sutures: Secondary | ICD-10-CM

## 2020-01-26 NOTE — Progress Notes (Signed)
NTS Suture removal x 1 suture removed, No s/s of infection healing well.

## 2020-01-26 NOTE — Telephone Encounter (Signed)
Spoke with the patient, detailed instructions were given. He stated that he understood and would be here for for his test. Asked to call back with any questions. S.Yarnell Arvidson EMTP

## 2020-01-27 DIAGNOSIS — Z992 Dependence on renal dialysis: Secondary | ICD-10-CM | POA: Diagnosis not present

## 2020-01-27 DIAGNOSIS — N2581 Secondary hyperparathyroidism of renal origin: Secondary | ICD-10-CM | POA: Diagnosis not present

## 2020-01-27 DIAGNOSIS — E876 Hypokalemia: Secondary | ICD-10-CM | POA: Diagnosis not present

## 2020-01-27 DIAGNOSIS — D689 Coagulation defect, unspecified: Secondary | ICD-10-CM | POA: Diagnosis not present

## 2020-01-27 DIAGNOSIS — N186 End stage renal disease: Secondary | ICD-10-CM | POA: Diagnosis not present

## 2020-01-30 ENCOUNTER — Other Ambulatory Visit: Payer: Self-pay

## 2020-01-30 DIAGNOSIS — N186 End stage renal disease: Secondary | ICD-10-CM | POA: Diagnosis not present

## 2020-01-30 DIAGNOSIS — D689 Coagulation defect, unspecified: Secondary | ICD-10-CM | POA: Diagnosis not present

## 2020-01-30 DIAGNOSIS — E876 Hypokalemia: Secondary | ICD-10-CM | POA: Diagnosis not present

## 2020-01-30 DIAGNOSIS — N2581 Secondary hyperparathyroidism of renal origin: Secondary | ICD-10-CM | POA: Diagnosis not present

## 2020-01-30 DIAGNOSIS — Z992 Dependence on renal dialysis: Secondary | ICD-10-CM | POA: Diagnosis not present

## 2020-01-30 MED ORDER — GABAPENTIN 300 MG PO CAPS: ORAL_CAPSULE | ORAL | 3 refills | Status: DC

## 2020-01-31 ENCOUNTER — Other Ambulatory Visit: Payer: Self-pay

## 2020-01-31 ENCOUNTER — Ambulatory Visit (HOSPITAL_BASED_OUTPATIENT_CLINIC_OR_DEPARTMENT_OTHER): Payer: Medicare Other

## 2020-01-31 ENCOUNTER — Ambulatory Visit (HOSPITAL_COMMUNITY): Payer: Medicare Other | Attending: Cardiovascular Disease

## 2020-01-31 DIAGNOSIS — R079 Chest pain, unspecified: Secondary | ICD-10-CM

## 2020-01-31 DIAGNOSIS — I5032 Chronic diastolic (congestive) heart failure: Secondary | ICD-10-CM

## 2020-01-31 LAB — MYOCARDIAL PERFUSION IMAGING
LV dias vol: 189 mL (ref 62–150)
LV sys vol: 93 mL
Peak HR: 82 {beats}/min
Rest HR: 59 {beats}/min
SDS: 0
SRS: 0
SSS: 0
TID: 1

## 2020-01-31 LAB — ECHOCARDIOGRAM COMPLETE
Height: 70 in
Weight: 3200 oz

## 2020-01-31 MED ORDER — TECHNETIUM TC 99M TETROFOSMIN IV KIT
32.7000 | PACK | Freq: Once | INTRAVENOUS | Status: AC | PRN
  Administered 2020-01-31: 32.7 via INTRAVENOUS
  Filled 2020-01-31: qty 33

## 2020-01-31 MED ORDER — REGADENOSON 0.4 MG/5ML IV SOLN
0.4000 mg | Freq: Once | INTRAVENOUS | Status: AC
  Administered 2020-01-31: 0.4 mg via INTRAVENOUS

## 2020-01-31 MED ORDER — TECHNETIUM TC 99M TETROFOSMIN IV KIT
10.9000 | PACK | Freq: Once | INTRAVENOUS | Status: AC | PRN
  Administered 2020-01-31: 10.9 via INTRAVENOUS
  Filled 2020-01-31: qty 11

## 2020-02-01 ENCOUNTER — Telehealth: Payer: Self-pay

## 2020-02-01 ENCOUNTER — Other Ambulatory Visit: Payer: Self-pay | Admitting: Cardiovascular Disease

## 2020-02-01 DIAGNOSIS — N186 End stage renal disease: Secondary | ICD-10-CM | POA: Diagnosis not present

## 2020-02-01 DIAGNOSIS — I5032 Chronic diastolic (congestive) heart failure: Secondary | ICD-10-CM

## 2020-02-01 DIAGNOSIS — I340 Nonrheumatic mitral (valve) insufficiency: Secondary | ICD-10-CM

## 2020-02-01 DIAGNOSIS — N2581 Secondary hyperparathyroidism of renal origin: Secondary | ICD-10-CM | POA: Diagnosis not present

## 2020-02-01 DIAGNOSIS — D689 Coagulation defect, unspecified: Secondary | ICD-10-CM | POA: Diagnosis not present

## 2020-02-01 DIAGNOSIS — Z992 Dependence on renal dialysis: Secondary | ICD-10-CM | POA: Diagnosis not present

## 2020-02-01 DIAGNOSIS — E876 Hypokalemia: Secondary | ICD-10-CM | POA: Diagnosis not present

## 2020-02-01 NOTE — Telephone Encounter (Signed)
Reviewed results with patient who verbalized understanding.   Repeat echo ordered to be scheduled in 1 year.  The patient understands he will be called to arrange both echo and visit with Dr. [name]. He was grateful for assistance.

## 2020-02-01 NOTE — Telephone Encounter (Signed)
-----   Message from [name], MD sent at 02/01/2020  6:06 AM EDT ----- Normal cardiac function with LVEF 40%, Grade 2 diastolic dysfunction indicates a stiff heart likely related to longstanding hypertension. The mitral valve has a moderate leak which is stable over time. Overall stable findings documented. I would like him to have a repeat echo in one year. thx

## 2020-02-02 ENCOUNTER — Ambulatory Visit (INDEPENDENT_AMBULATORY_CARE_PROVIDER_SITE_OTHER): Payer: Medicare Other | Admitting: Physician Assistant

## 2020-02-02 ENCOUNTER — Encounter: Payer: Self-pay | Admitting: Physician Assistant

## 2020-02-02 ENCOUNTER — Other Ambulatory Visit: Payer: Self-pay

## 2020-02-02 VITALS — Ht 70.0 in | Wt 200.0 lb

## 2020-02-02 DIAGNOSIS — S83242D Other tear of medial meniscus, current injury, left knee, subsequent encounter: Secondary | ICD-10-CM

## 2020-02-02 NOTE — Progress Notes (Signed)
Office Visit Note   Patient: Albert Eaton           Date of Birth: [birth year]           MRN: [id number] Visit Date: 02/02/2020              Requested by: [name], FNP [address],  [place] [address] PCP: [name], FNP  Chief Complaint  Patient presents with  . Right Knee - Follow-up    S/p cortisone injection 01/03/20   . Right Ankle - Follow-up      HPI: This is a pleasant 55 year old gentleman who presents in follow-up for his right knee and right ankle pain at his last visit 3 weeks ago he complained of right knee pain and some painful popping and clicking.   he was given a steroid injection.  He only thinks this helped a very small amount.  He was also seen and evaluated for his right ankle and findings were consistent with posterior tibial tendinitis.  He was given a posterior tibial tendon brace which she says did not seem to help him much and he actually felt better when he remove this from his shoe.   Assessment & Plan: Visit Diagnoses:  1. Other tear of medial meniscus, current injury, left knee, subsequent encounter     Plan: His x-rays were reviewed there are not significant degenerative changes.  He does have some mechanical symptoms which have been a bit more prevalent in the last couple months I recommend an MRI to evaluate for a mechanical cause for his symptoms such as a meniscus tear if this is so he may benefit from an arthroscopy.  If findings are more consistent with generalized arthritis he understands an arthroscopy may not be of benefit.  With regards to his right ankle he is actually overall doing better with this.  He does have some lateral impingement findings.  Also tender along the posterior tibial tendon but overall he is improved.  I recommend that he just try a mild arch support such as a sole orthotic.  He will follow-up once his MRI is completed  Follow-Up Instructions: No follow-ups on file.   Ortho Exam  Patient  is alert, oriented, no adenopathy, well-dressed, normal affect, normal respiratory effort. Right ankle: Mild planovalgus collapse.  Mild tenderness along the posterior tibial tendon.  He has some mild tenderness with impingement laterally.  Right knee.  No swelling no effusion  tenderness over the lateral joint line.  No grinding or crepitus.  No McMurray's no instability findings  Imaging: No results found. No images are attached to the encounter.  Labs: Lab Results  Component Value Date   HGBA1C 5.1 10/31/2016   ESRSEDRATE 60 (H) 01/01/2017   CRP 112.4 (H) 01/01/2017   LABURIC 8.1 (H) 03/30/2017   LABURIC 7.1 01/27/2017   LABURIC 8.7 (H) 01/01/2017   REPTSTATUS 06/12/2017 FINAL 06/07/2017   CULT NO GROWTH 5 DAYS 06/07/2017   LABORGA KLEBSIELLA PNEUMONIAE (A) 05/31/2017     Lab Results  Component Value Date   ALBUMIN 4.7 09/20/2018   ALBUMIN 4.6 07/08/2018   ALBUMIN 4.1 12/01/2017   LABURIC 8.1 (H) 03/30/2017   LABURIC 7.1 01/27/2017   LABURIC 8.7 (H) 01/01/2017    Lab Results  Component Value Date   MG 2.3 06/13/2018   MG 1.6 (L) 06/08/2017   MG 2.2 01/06/2017   Lab Results  Component Value Date   VD25OH 14.9 (L)  07/08/2018    No results found for: PREALBUMIN CBC EXTENDED Latest Ref Rng & Units 10/06/2019 11/22/2018 09/20/2018  WBC 3.8 - 10.8 Thousand/uL 3.9 6.0 6.0  RBC 4.20 - 5.80 Million/uL 3.45(L) 4.18(L) 3.71(L)  HGB 13.2 - 17.1 g/dL 11.4(L) 12.5(L) 11.3(L)  HCT 38.5 - 50.0 % 32.8(L) 36.3(L) 32.8(L)  PLT 140 - 400 Thousand/uL 213 283 301  NEUTROABS 1.4 - 7.0 x10E3/uL - - 2.8  LYMPHSABS 0.7 - 3.1 x10E3/uL - - 2.2     Body mass index is 28.7 kg/m.  Orders:  Orders Placed This Encounter  Procedures  . MR Knee Right w/o contrast   No orders of the defined types were placed in this encounter.    Procedures: No procedures performed  Clinical Data: No additional findings.  ROS:  All other systems negative, except as noted in the HPI. Review  of Systems  Objective: Vital Signs: Ht 5' 10"  (1.778 m)   Wt 200 lb (90.7 kg)   BMI 28.70 kg/m   Specialty Comments:  No specialty comments available.  PMFS History: Patient Active Problem List   Diagnosis Date Noted  . Pulsatile tinnitus of right ear 11/21/2019  . Kidney failure 11/21/2019  . CHF (congestive heart failure) ([place]) 11/21/2019  . Chills (without fever) 11/07/2019  . Hypercalcemia 10/17/2019  . Dependence on renal dialysis ([place]) 06/06/2019  . Coagulation defect, unspecified ([place]) 05/23/2019  . Iron deficiency anemia, unspecified 05/16/2019  . Anemia in other chronic diseases classified elsewhere 05/13/2019  . Arteriovenous fistula, acquired ([place]) 05/13/2019  . Body mass index (BMI) 28.0-28.9, adult 05/13/2019  . Crohn's disease of small intestine without complications ([place]) 73/22/0254  . Encounter for screening for respiratory tuberculosis 05/13/2019  . Gout, unspecified 05/13/2019  . Nausea 05/13/2019  . Other fatigue 05/13/2019  . Secondary hyperparathyroidism of renal origin ([place]) 05/13/2019  . End stage renal disease ([place]) 05/13/2019  . Chronic diastolic (congestive) heart failure ([place]) 05/13/2019  . Chronic kidney disease, unspecified 05/13/2019  . Hypertensive chronic kidney disease with stage 1 through stage 4 chronic kidney disease, or unspecified chronic kidney disease 05/13/2019  . Obstructive sleep apnea 05/13/2019  . Discomfort of right ear 02/18/2019  . Chronic kidney disease (CKD), stage IV (severe) ([place]) 06/15/2018  . Hypokalemia 06/13/2018  . Abnormal CT scan, small bowel   . Perennial allergic rhinitis with a predominant nonallergic component 02/09/2018  . Allergic conjunctivitis 02/09/2018  . Mild intermittent asthma 02/09/2018  . Atherosclerosis 12/09/2017  . Hypothyroidism 11/19/2017  . Mixed hyperlipidemia 10/13/2017  . Personal history of gout 10/13/2017  . S/P cholecystectomy 10/13/2017  . Congestive heart failure with LV diastolic  dysfunction, NYHA class 1 ([place]) 10/13/2017  . Hypothyroidism (acquired) 10/13/2017  . 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 SOB (shortness of breath) 06/07/2017  . Arthritis, gouty 01/27/2017  . Chronic diastolic CHF (congestive heart failure) ([place]) 01/06/2017  . OSA (obstructive sleep apnea) 01/06/2017  . Abnormal electrocardiogram (ECG) (EKG) 10/31/2016  . Chest pain 10/09/2016  . HIV disease ([place]) 10/09/2016  . Hypertensive heart disease with CHF (congestive heart failure) ([place]) 10/09/2016  . Chronic kidney disease, stage 4 (severe) ([place]) 05/14/2016  . Left-sided low back pain without sciatica 05/29/2015  . NYHA class 2 heart failure with preserved ejection fraction ([place]) 05/15/2015  . PAH (pulmonary artery hypertension) ([place]) 05/08/2015  . Hypertension 05/04/2015  . Non-rheumatic mitral regurgitation 05/04/2015  . Prolonged Q-T interval on ECG 05/04/2015  . Human immunodeficiency virus (HIV) disease ([place]) 05/04/2015   Past  Medical History:  Diagnosis Date  . Anemia   . Aortic atherosclerosis ([place])   . Asthma   . Chronic diastolic CHF (congestive heart failure) ([place]) 01/06/2017   Echo 7/16 [facility] in [place], [name]) Mild AI, mild LAE, mild concentric LVH, EF 55, normal wall motion, mild to moderate MR, mild PI, RVSP 55 // Echo 10/09/16 ([facility]):  Moderate LVH, grade 2 diastolic dysfunction, mild MR, moderate LAE   . CKD (chronic kidney disease)    per pt, waiting on a kidney transplant in near future.  . Esophagitis   . Gout   . Hepatitis    Hep B  . History of nuclear stress test    a. Nuc study 7/16: no scar or ischemia, EF 42 // b. Nuc study 12/17: EF 48, ?small apical ischemia, Low Risk  . HIV (human immunodeficiency virus infection) ([place])   . HLD (hyperlipidemia)   . Hypertension   . Hypertensive heart disease with CHF (congestive heart failure) ([place]) 10/09/2016  . Mitral regurgitation   . OSA (obstructive sleep apnea)   . Post-operative nausea and vomiting      1 time as a child  . Sleep apnea    uses c-pap  . Wears glasses   . Wears partial dentures     Family History  Problem Relation Age of Onset  . Heart failure Mother   . Heart attack Maternal Grandmother 53  . Hypertension Sister   . Multiple sclerosis Sister   . Hypertension Brother   . Hypertension Sister   . Scoliosis Other   . Allergic rhinitis Neg Hx   . Angioedema Neg Hx   . Asthma Neg Hx   . Eczema Neg Hx   . Immunodeficiency Neg Hx   . Urticaria Neg Hx     Past Surgical History:  Procedure Laterality Date  . AV FISTULA PLACEMENT Left 07/02/2018   Procedure: Creation of Left arm BRACHIOBASILIC ARTERIOVENOUS  FISTULA;  Surgeon: Marty Heck, MD;  Location: [facility];  Service: Vascular;  Laterality: Left;  . BASCILIC VEIN TRANSPOSITION Left 08/27/2018   Procedure: Left arm BRACHIOBASILIC VEIN TRANSPOSITION SECOND STAGE;  Surgeon: Marty Heck, MD;  Location: [facility];  Service: Vascular;  Laterality: Left;  . CHOLECYSTECTOMY    . COLONOSCOPY W/ BIOPSIES AND POLYPECTOMY    . GIVENS CAPSULE STUDY N/A 03/01/2018   Procedure: GIVENS CAPSULE STUDY;  Surgeon: Jerene Bears, MD;  Location: [facility];  Service: Gastroenterology;  Laterality: N/A;  . HERNIA REPAIR     As baby  . MULTIPLE TOOTH EXTRACTIONS    . NOSE SURGERY    . RENAL BIOPSY     Social History   Occupational History  . Not on file  Tobacco Use  . Smoking status: Former Smoker    Types: Cigarettes    Quit date: 2000    Years since quitting: 21.2  . Smokeless tobacco: Never Used  Substance and Sexual Activity  . Alcohol use: Not Currently    Alcohol/week: 2.0 standard drinks    Types: 2 Shots of liquor per week  . Drug use: No  . Sexual activity: Never    Partners: Male    Birth control/protection: Condom    Comment: declined condoms

## 2020-02-03 DIAGNOSIS — N186 End stage renal disease: Secondary | ICD-10-CM | POA: Diagnosis not present

## 2020-02-03 DIAGNOSIS — D689 Coagulation defect, unspecified: Secondary | ICD-10-CM | POA: Diagnosis not present

## 2020-02-03 DIAGNOSIS — Z992 Dependence on renal dialysis: Secondary | ICD-10-CM | POA: Diagnosis not present

## 2020-02-03 DIAGNOSIS — N2581 Secondary hyperparathyroidism of renal origin: Secondary | ICD-10-CM | POA: Diagnosis not present

## 2020-02-03 DIAGNOSIS — E876 Hypokalemia: Secondary | ICD-10-CM | POA: Diagnosis not present

## 2020-02-06 DIAGNOSIS — Z992 Dependence on renal dialysis: Secondary | ICD-10-CM | POA: Diagnosis not present

## 2020-02-06 DIAGNOSIS — N2581 Secondary hyperparathyroidism of renal origin: Secondary | ICD-10-CM | POA: Diagnosis not present

## 2020-02-06 DIAGNOSIS — D689 Coagulation defect, unspecified: Secondary | ICD-10-CM | POA: Diagnosis not present

## 2020-02-06 DIAGNOSIS — E876 Hypokalemia: Secondary | ICD-10-CM | POA: Diagnosis not present

## 2020-02-06 DIAGNOSIS — N186 End stage renal disease: Secondary | ICD-10-CM | POA: Diagnosis not present

## 2020-02-08 DIAGNOSIS — D689 Coagulation defect, unspecified: Secondary | ICD-10-CM | POA: Diagnosis not present

## 2020-02-08 DIAGNOSIS — N2581 Secondary hyperparathyroidism of renal origin: Secondary | ICD-10-CM | POA: Diagnosis not present

## 2020-02-08 DIAGNOSIS — Z992 Dependence on renal dialysis: Secondary | ICD-10-CM | POA: Diagnosis not present

## 2020-02-08 DIAGNOSIS — N186 End stage renal disease: Secondary | ICD-10-CM | POA: Diagnosis not present

## 2020-02-08 DIAGNOSIS — E876 Hypokalemia: Secondary | ICD-10-CM | POA: Diagnosis not present

## 2020-02-10 DIAGNOSIS — N2581 Secondary hyperparathyroidism of renal origin: Secondary | ICD-10-CM | POA: Diagnosis not present

## 2020-02-10 DIAGNOSIS — D689 Coagulation defect, unspecified: Secondary | ICD-10-CM | POA: Diagnosis not present

## 2020-02-10 DIAGNOSIS — E876 Hypokalemia: Secondary | ICD-10-CM | POA: Diagnosis not present

## 2020-02-10 DIAGNOSIS — N186 End stage renal disease: Secondary | ICD-10-CM | POA: Diagnosis not present

## 2020-02-10 DIAGNOSIS — Z992 Dependence on renal dialysis: Secondary | ICD-10-CM | POA: Diagnosis not present

## 2020-02-13 DIAGNOSIS — D689 Coagulation defect, unspecified: Secondary | ICD-10-CM | POA: Diagnosis not present

## 2020-02-13 DIAGNOSIS — N2581 Secondary hyperparathyroidism of renal origin: Secondary | ICD-10-CM | POA: Diagnosis not present

## 2020-02-13 DIAGNOSIS — N186 End stage renal disease: Secondary | ICD-10-CM | POA: Diagnosis not present

## 2020-02-13 DIAGNOSIS — E876 Hypokalemia: Secondary | ICD-10-CM | POA: Diagnosis not present

## 2020-02-13 DIAGNOSIS — Z992 Dependence on renal dialysis: Secondary | ICD-10-CM | POA: Diagnosis not present

## 2020-02-14 ENCOUNTER — Other Ambulatory Visit: Payer: Self-pay

## 2020-02-14 ENCOUNTER — Other Ambulatory Visit: Payer: Self-pay | Admitting: Infectious Diseases

## 2020-02-14 DIAGNOSIS — B20 Human immunodeficiency virus [HIV] disease: Secondary | ICD-10-CM

## 2020-02-14 MED ORDER — ISENTRESS 400 MG PO TABS: 400.0000 mg | ORAL_TABLET | Freq: Two times a day (BID) | ORAL | 3 refills | Status: DC

## 2020-02-14 MED ORDER — LAMIVUDINE 100 MG PO TABS: ORAL_TABLET | ORAL | 3 refills | Status: DC

## 2020-02-14 MED ORDER — ABACAVIR SULFATE 300 MG PO TABS: 300.0000 mg | ORAL_TABLET | Freq: Every day | ORAL | 3 refills | Status: DC

## 2020-02-15 DIAGNOSIS — D689 Coagulation defect, unspecified: Secondary | ICD-10-CM | POA: Diagnosis not present

## 2020-02-15 DIAGNOSIS — E876 Hypokalemia: Secondary | ICD-10-CM | POA: Diagnosis not present

## 2020-02-15 DIAGNOSIS — N186 End stage renal disease: Secondary | ICD-10-CM | POA: Diagnosis not present

## 2020-02-15 DIAGNOSIS — N2581 Secondary hyperparathyroidism of renal origin: Secondary | ICD-10-CM | POA: Diagnosis not present

## 2020-02-15 DIAGNOSIS — Z992 Dependence on renal dialysis: Secondary | ICD-10-CM | POA: Diagnosis not present

## 2020-02-17 DIAGNOSIS — D689 Coagulation defect, unspecified: Secondary | ICD-10-CM | POA: Diagnosis not present

## 2020-02-17 DIAGNOSIS — Z992 Dependence on renal dialysis: Secondary | ICD-10-CM | POA: Diagnosis not present

## 2020-02-17 DIAGNOSIS — N2581 Secondary hyperparathyroidism of renal origin: Secondary | ICD-10-CM | POA: Diagnosis not present

## 2020-02-17 DIAGNOSIS — E876 Hypokalemia: Secondary | ICD-10-CM | POA: Diagnosis not present

## 2020-02-17 DIAGNOSIS — N186 End stage renal disease: Secondary | ICD-10-CM | POA: Diagnosis not present

## 2020-02-20 ENCOUNTER — Ambulatory Visit: Payer: Medicare Other | Admitting: Cardiovascular Disease

## 2020-02-20 DIAGNOSIS — E876 Hypokalemia: Secondary | ICD-10-CM | POA: Diagnosis not present

## 2020-02-20 DIAGNOSIS — N2581 Secondary hyperparathyroidism of renal origin: Secondary | ICD-10-CM | POA: Diagnosis not present

## 2020-02-20 DIAGNOSIS — N186 End stage renal disease: Secondary | ICD-10-CM | POA: Diagnosis not present

## 2020-02-20 DIAGNOSIS — Z992 Dependence on renal dialysis: Secondary | ICD-10-CM | POA: Diagnosis not present

## 2020-02-20 DIAGNOSIS — D689 Coagulation defect, unspecified: Secondary | ICD-10-CM | POA: Diagnosis not present

## 2020-02-22 DIAGNOSIS — N2581 Secondary hyperparathyroidism of renal origin: Secondary | ICD-10-CM | POA: Diagnosis not present

## 2020-02-22 DIAGNOSIS — Z992 Dependence on renal dialysis: Secondary | ICD-10-CM | POA: Diagnosis not present

## 2020-02-22 DIAGNOSIS — D689 Coagulation defect, unspecified: Secondary | ICD-10-CM | POA: Diagnosis not present

## 2020-02-22 DIAGNOSIS — N186 End stage renal disease: Secondary | ICD-10-CM | POA: Diagnosis not present

## 2020-02-22 DIAGNOSIS — E876 Hypokalemia: Secondary | ICD-10-CM | POA: Diagnosis not present

## 2020-02-24 DIAGNOSIS — N2581 Secondary hyperparathyroidism of renal origin: Secondary | ICD-10-CM | POA: Diagnosis not present

## 2020-02-24 DIAGNOSIS — Z992 Dependence on renal dialysis: Secondary | ICD-10-CM | POA: Diagnosis not present

## 2020-02-24 DIAGNOSIS — D689 Coagulation defect, unspecified: Secondary | ICD-10-CM | POA: Diagnosis not present

## 2020-02-24 DIAGNOSIS — I129 Hypertensive chronic kidney disease with stage 1 through stage 4 chronic kidney disease, or unspecified chronic kidney disease: Secondary | ICD-10-CM | POA: Diagnosis not present

## 2020-02-24 DIAGNOSIS — N186 End stage renal disease: Secondary | ICD-10-CM | POA: Diagnosis not present

## 2020-02-24 DIAGNOSIS — E876 Hypokalemia: Secondary | ICD-10-CM | POA: Diagnosis not present

## 2020-02-29 ENCOUNTER — Other Ambulatory Visit: Payer: Self-pay | Admitting: Cardiovascular Disease

## 2020-02-29 DIAGNOSIS — D689 Coagulation defect, unspecified: Secondary | ICD-10-CM | POA: Diagnosis not present

## 2020-02-29 DIAGNOSIS — Z992 Dependence on renal dialysis: Secondary | ICD-10-CM | POA: Diagnosis not present

## 2020-02-29 DIAGNOSIS — N186 End stage renal disease: Secondary | ICD-10-CM | POA: Diagnosis not present

## 2020-02-29 DIAGNOSIS — E876 Hypokalemia: Secondary | ICD-10-CM | POA: Diagnosis not present

## 2020-02-29 DIAGNOSIS — N2581 Secondary hyperparathyroidism of renal origin: Secondary | ICD-10-CM | POA: Diagnosis not present

## 2020-03-02 DIAGNOSIS — N2581 Secondary hyperparathyroidism of renal origin: Secondary | ICD-10-CM | POA: Diagnosis not present

## 2020-03-02 DIAGNOSIS — D689 Coagulation defect, unspecified: Secondary | ICD-10-CM | POA: Diagnosis not present

## 2020-03-02 DIAGNOSIS — E876 Hypokalemia: Secondary | ICD-10-CM | POA: Diagnosis not present

## 2020-03-02 DIAGNOSIS — Z992 Dependence on renal dialysis: Secondary | ICD-10-CM | POA: Diagnosis not present

## 2020-03-02 DIAGNOSIS — N186 End stage renal disease: Secondary | ICD-10-CM | POA: Diagnosis not present

## 2020-03-03 ENCOUNTER — Ambulatory Visit
Admission: RE | Admit: 2020-03-03 | Discharge: 2020-03-03 | Disposition: A | Discharge status: Home / Self Care | Payer: Medicare Other | Source: Ambulatory Visit | Attending: Physician Assistant | Admitting: Physician Assistant

## 2020-03-03 DIAGNOSIS — S83242D Other tear of medial meniscus, current injury, left knee, subsequent encounter: Secondary | ICD-10-CM

## 2020-03-03 DIAGNOSIS — M25561 Pain in right knee: Secondary | ICD-10-CM | POA: Diagnosis not present

## 2020-03-03 IMAGING — MR MR KNEE*R* W/O CM
5 of 7 series · 23 of 40 positions shown · non-contrast
Comparison: Plain films right knee from [REDACTED] [HOSPITAL]
[DATE].

CLINICAL DATA: Anterior right knee pain which is worst inferior to
the patella for several years. No known injury.

EXAM:
MRI OF THE RIGHT KNEE WITHOUT CONTRAST
TECHNIQUE: Multiplanar, multisequence MR imaging of the knee was performed. No
intravenous contrast was administered.

[Series 3: T2 fat-sat · axial · 4.0mm · 0.50mm/px · z∈[-72,+63]mm · 6 of 28 slices shown (1 of 2)]
[im 1/28]
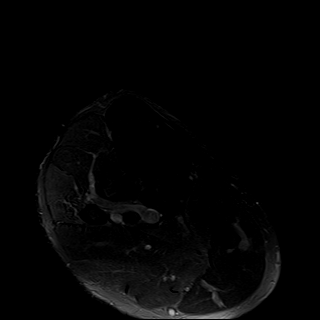
[im 6/28]
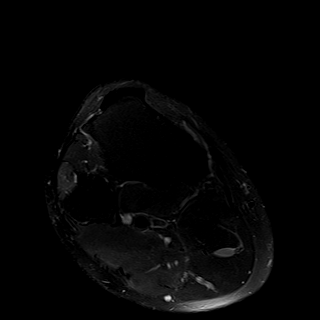
[im 11/28]
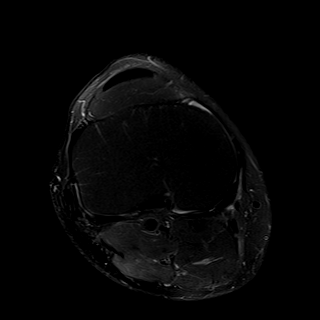
[im 17/28]
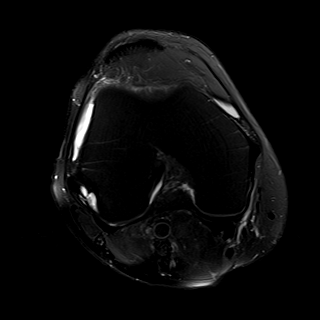
[im 22/28]
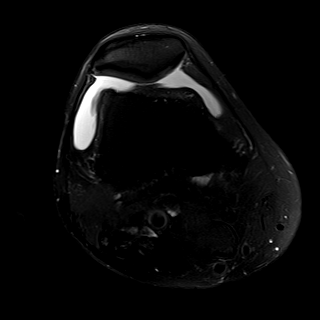
[im 28/28]
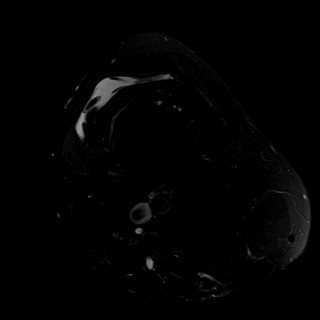

[Series 5: T2 fat-sat · coronal · 4.0mm · 0.29mm/px · 1 of 24 slices shown (2 of 2)]
[im 1/24]
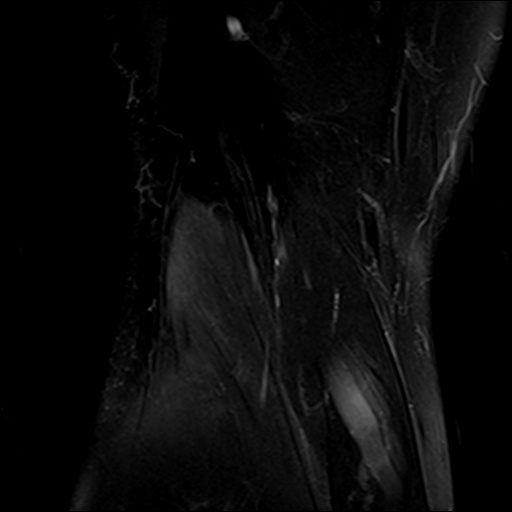

[Series 7: PD fat-sat · sagittal · 3.0mm · 0.29mm/px · 6 of 27 slices shown (1 of 3)]
[im 1/27]
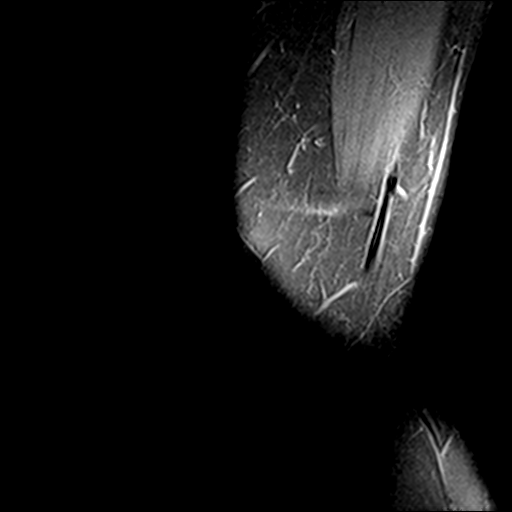
[im 6/27]
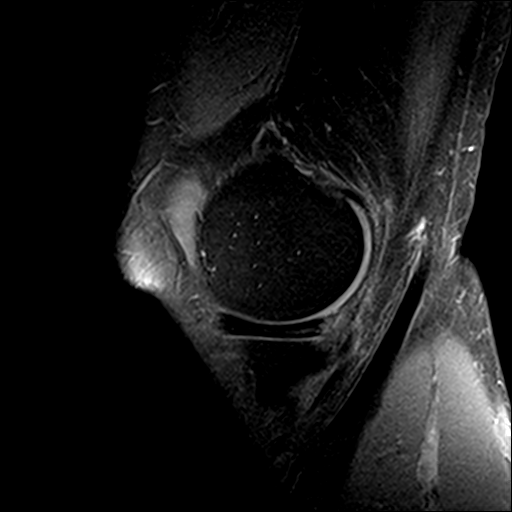
[im 11/27]
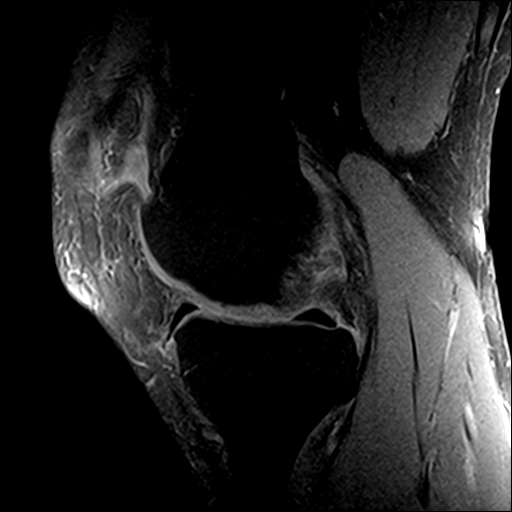
[im 16/27]
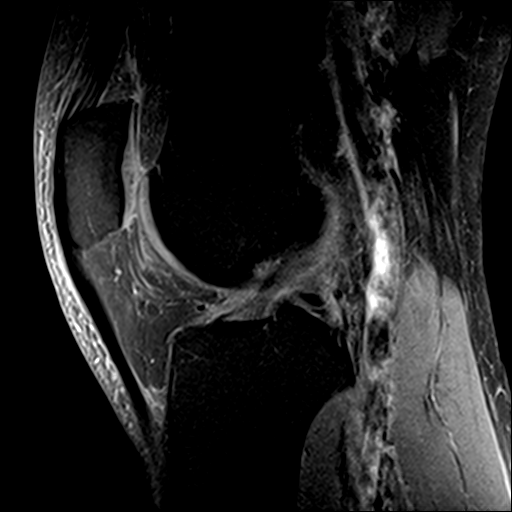
[im 21/27]
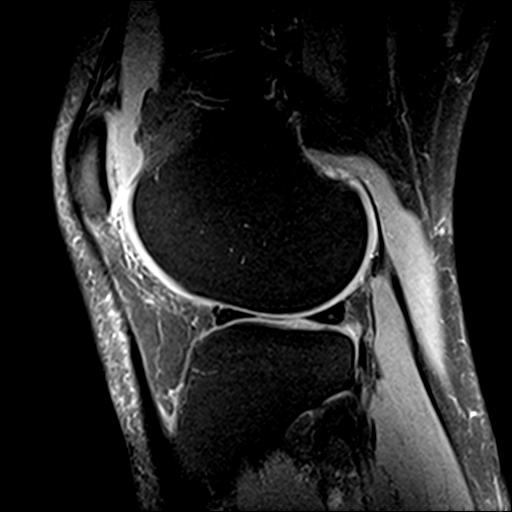
[im 27/27]
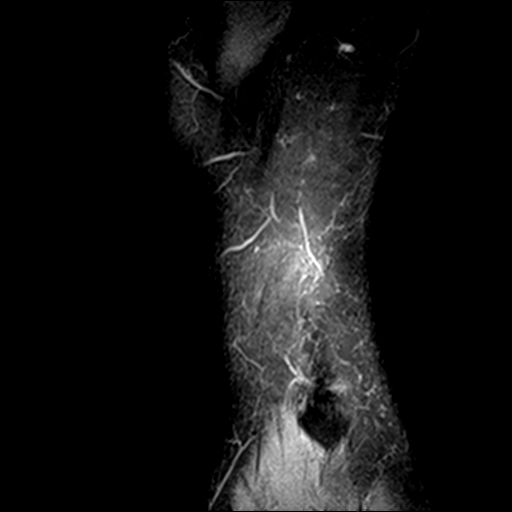

[Series 8: PD fat-sat · coronal · 3.0mm · 0.29mm/px · 7 of 30 slices shown (2 of 3)]
[im 1/30]
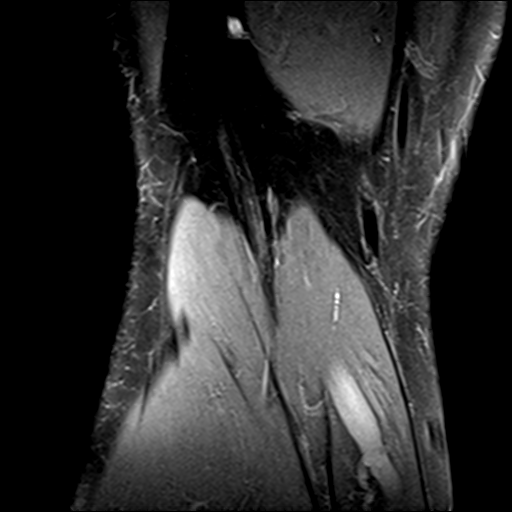
[im 5/30]
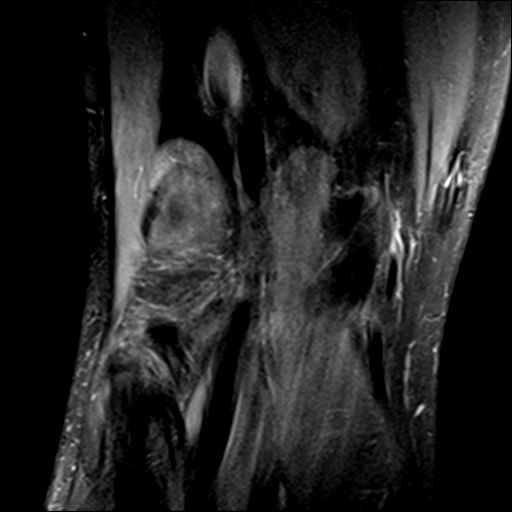
[im 10/30]
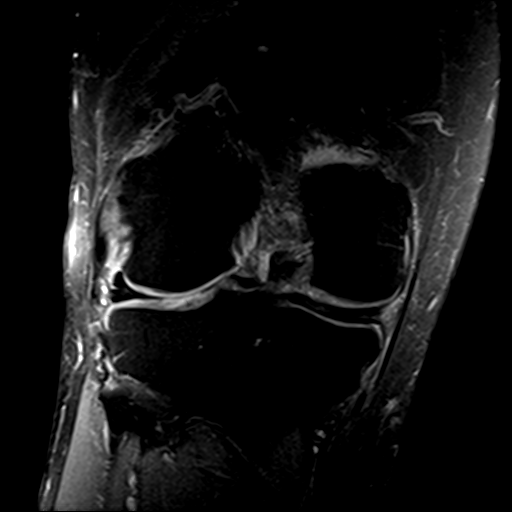
[im 15/30]
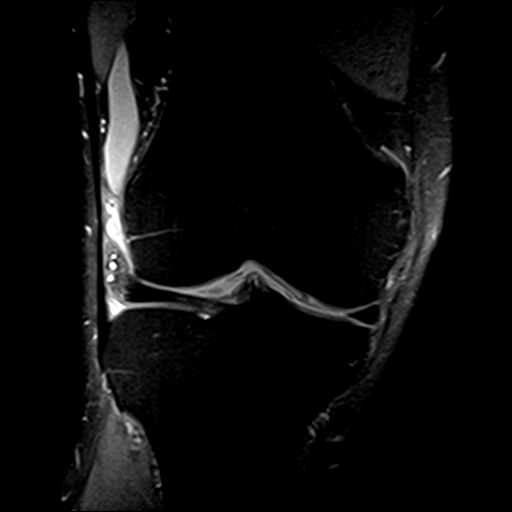
[im 20/30]
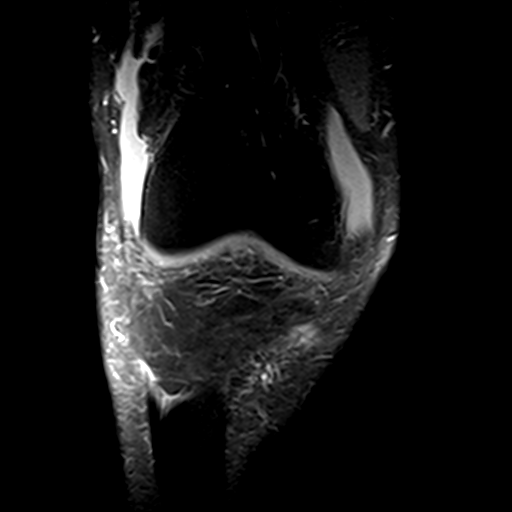
[im 25/30]
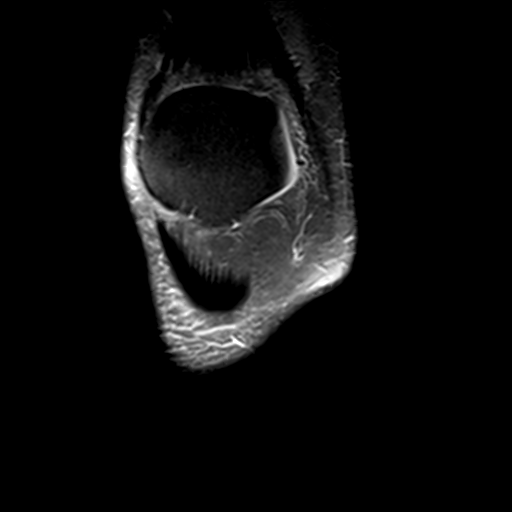
[im 30/30]
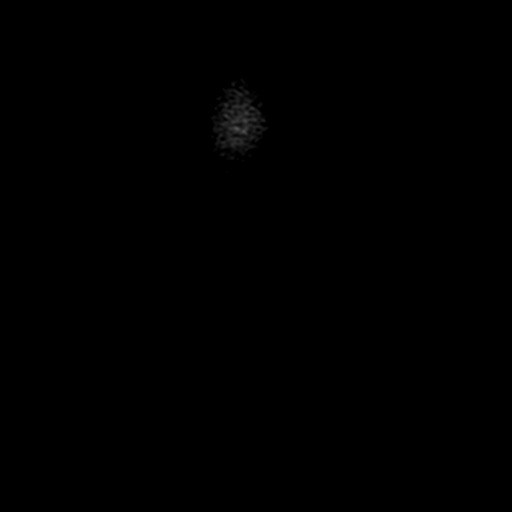

[Series 9: PD fat-sat · oblique · 2.3mm · 0.29mm/px · 3 of 11 slices shown (3 of 3)]
[im 1/11]
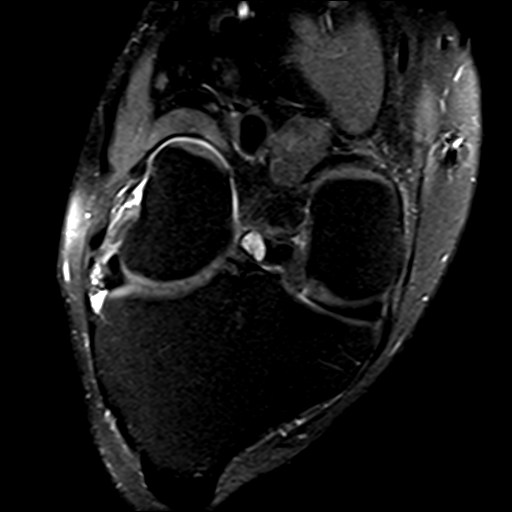
[im 6/11]
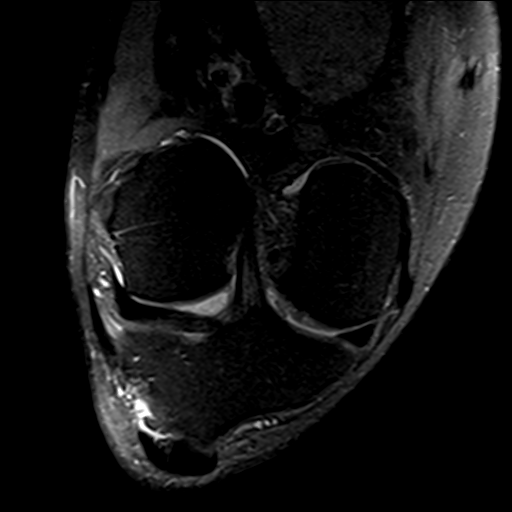
[im 11/11]
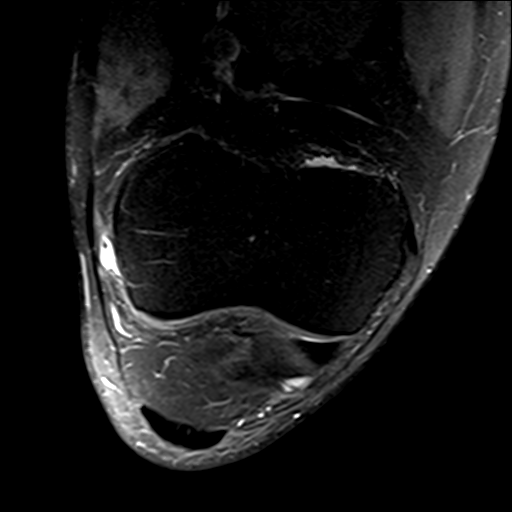

[23 of 40 positions shown; findings below may reference images not displayed]

FINDINGS: MENISCI

Medial meniscus:  Intact.

Lateral meniscus:  Intact.

LIGAMENTS

Cruciates:  Intact.

Collaterals:  Intact.

CARTILAGE

Patellofemoral:  Minimally degenerated without focal defect.

Medial:  Mildly degenerated without focal defect.

Lateral:  Mildly degenerated without focal defect.

Joint:  Small joint effusion.

Popliteal Fossa:  No Baker's cyst.

Extensor Mechanism:  Intact.

Bones: Normal marrow signal throughout without fracture, stress
change, subchondral cyst formation or edema. No osteophytosis.

Other: None.
IMPRESSION: Mild degenerative change about the knee. The examination is
otherwise negative. No meniscal or ligament tear

## 2020-03-05 DIAGNOSIS — N186 End stage renal disease: Secondary | ICD-10-CM | POA: Diagnosis not present

## 2020-03-05 DIAGNOSIS — Z992 Dependence on renal dialysis: Secondary | ICD-10-CM | POA: Diagnosis not present

## 2020-03-05 DIAGNOSIS — N2581 Secondary hyperparathyroidism of renal origin: Secondary | ICD-10-CM | POA: Diagnosis not present

## 2020-03-05 DIAGNOSIS — E876 Hypokalemia: Secondary | ICD-10-CM | POA: Diagnosis not present

## 2020-03-05 DIAGNOSIS — D689 Coagulation defect, unspecified: Secondary | ICD-10-CM | POA: Diagnosis not present

## 2020-03-06 ENCOUNTER — Ambulatory Visit: Payer: Medicare Other | Admitting: Orthopedic Surgery

## 2020-03-07 DIAGNOSIS — E876 Hypokalemia: Secondary | ICD-10-CM | POA: Diagnosis not present

## 2020-03-07 DIAGNOSIS — D689 Coagulation defect, unspecified: Secondary | ICD-10-CM | POA: Diagnosis not present

## 2020-03-07 DIAGNOSIS — N186 End stage renal disease: Secondary | ICD-10-CM | POA: Diagnosis not present

## 2020-03-07 DIAGNOSIS — N2581 Secondary hyperparathyroidism of renal origin: Secondary | ICD-10-CM | POA: Diagnosis not present

## 2020-03-07 DIAGNOSIS — Z992 Dependence on renal dialysis: Secondary | ICD-10-CM | POA: Diagnosis not present

## 2020-03-08 ENCOUNTER — Ambulatory Visit: Payer: Self-pay

## 2020-03-08 ENCOUNTER — Ambulatory Visit (INDEPENDENT_AMBULATORY_CARE_PROVIDER_SITE_OTHER): Payer: Medicare Other | Admitting: Orthopedic Surgery

## 2020-03-08 ENCOUNTER — Encounter: Payer: Self-pay | Admitting: Orthopedic Surgery

## 2020-03-08 ENCOUNTER — Other Ambulatory Visit: Payer: Self-pay

## 2020-03-08 VITALS — Ht 70.0 in | Wt 200.0 lb

## 2020-03-08 DIAGNOSIS — M25511 Pain in right shoulder: Secondary | ICD-10-CM

## 2020-03-08 DIAGNOSIS — M25561 Pain in right knee: Secondary | ICD-10-CM

## 2020-03-08 DIAGNOSIS — G8929 Other chronic pain: Secondary | ICD-10-CM

## 2020-03-08 MED ORDER — METHYLPREDNISOLONE ACETATE 40 MG/ML IJ SUSP
40.0000 mg | INTRAMUSCULAR | Status: AC | PRN
  Administered 2020-03-08: 40 mg via INTRA_ARTICULAR

## 2020-03-08 MED ORDER — LIDOCAINE HCL 1 % IJ SOLN
5.0000 mL | INTRAMUSCULAR | Status: AC | PRN
  Administered 2020-03-08: 5 mL

## 2020-03-08 NOTE — Progress Notes (Signed)
Office Visit Note   Patient: Albert Eaton           Date of Birth: [birth year]           MRN: [id number] Visit Date: 03/08/2020              Requested by: [name], FNP [address],  [place] [address] PCP: [name], FNP  Chief Complaint  Patient presents with  . Right Knee - Follow-up      HPI: Patient is a 54 year old gentleman with chronic right knee pain.  He is status post an MRI scan of the right knee.  Denies any mechanical locking symptoms.  Patient states he has been having increasing pain in the right shoulder over the past 3 weeks denies any trauma states it is difficult to reach overhead or reach behind himself.  Assessment & Plan: Visit Diagnoses:  1. Acute pain of right shoulder   2. Chronic pain of right knee     Plan: Right shoulder was injected in the subacromial space he tolerated this well patient was given instructions for close chain kinetic exercises for the right knee.  Follow-Up Instructions: Return if symptoms worsen or fail to improve.   Ortho Exam  Patient is alert, oriented, no adenopathy, well-dressed, normal affect, normal respiratory effort. Examination patient has abduction and flexion of the right shoulder actively only to 90 degrees passively he has full range of motion.  He has no tenderness to palpation over the [facility] joint.  Biceps tendon is tender to palpation Neer and Hawkins impingement tests are painful.  Patient's right knee has no effusion.  Review of the MRI scan of the right knee shows mild degenerative changes with no meniscal or ligamentous tear  Imaging: XR Shoulder Right  Result Date: 03/08/2020 2 view radiographs of the right shoulder shows a congruent glenohumeral joint no AC arthropathy lung field is clear  No images are attached to the encounter.  Labs: Lab Results  Component Value Date   HGBA1C 5.1 10/31/2016   ESRSEDRATE 60 (H) 01/01/2017   CRP 112.4 (H) 01/01/2017   LABURIC 8.1  (H) 03/30/2017   LABURIC 7.1 01/27/2017   LABURIC 8.7 (H) 01/01/2017   REPTSTATUS 06/12/2017 FINAL 06/07/2017   CULT NO GROWTH 5 DAYS 06/07/2017   LABORGA KLEBSIELLA PNEUMONIAE (A) 05/31/2017     Lab Results  Component Value Date   ALBUMIN 4.7 09/20/2018   ALBUMIN 4.6 07/08/2018   ALBUMIN 4.1 12/01/2017   LABURIC 8.1 (H) 03/30/2017   LABURIC 7.1 01/27/2017   LABURIC 8.7 (H) 01/01/2017    Lab Results  Component Value Date   MG 2.3 06/13/2018   MG 1.6 (L) 06/08/2017   MG 2.2 01/06/2017   Lab Results  Component Value Date   VD25OH 14.9 (L) 07/08/2018    No results found for: PREALBUMIN CBC EXTENDED Latest Ref Rng & Units 10/06/2019 11/22/2018 09/20/2018  WBC 3.8 - 10.8 Thousand/uL 3.9 6.0 6.0  RBC 4.20 - 5.80 Million/uL 3.45(L) 4.18(L) 3.71(L)  HGB 13.2 - 17.1 g/dL 11.4(L) 12.5(L) 11.3(L)  HCT 38.5 - 50.0 % 32.8(L) 36.3(L) 32.8(L)  PLT 140 - 400 Thousand/uL 213 283 301  NEUTROABS 1.4 - 7.0 x10E3/uL - - 2.8  LYMPHSABS 0.7 - 3.1 x10E3/uL - - 2.2     Body mass index is 28.7 kg/m.  Orders:  Orders Placed This Encounter  Procedures  . XR Shoulder Right   No orders of the defined types were  placed in this encounter.    Procedures: Large Joint Inj: R subacromial bursa on 03/08/2020 9:08 AM Indications: diagnostic evaluation and pain Details: 22 G 1.5 in needle, posterior approach  Arthrogram: No  Medications: 5 mL lidocaine 1 %; 40 mg methylPREDNISolone acetate 40 MG/ML Outcome: tolerated well, no immediate complications Procedure, treatment alternatives, risks and benefits explained, specific risks discussed. Consent was given by the patient. Immediately prior to procedure a time out was called to verify the correct patient, procedure, equipment, support staff and site/side marked as required. Patient was prepped and draped in the usual sterile fashion.      Clinical Data: No additional findings.  ROS:  All other systems negative, except as noted in the  HPI. Review of Systems  Objective: Vital Signs: Ht 5' 10"  (1.778 m)   Wt 200 lb (90.7 kg)   BMI 28.70 kg/m   Specialty Comments:  No specialty comments available.  PMFS History: Patient Active Problem List   Diagnosis Date Noted  . Pulsatile tinnitus of right ear 11/21/2019  . Kidney failure 11/21/2019  . CHF (congestive heart failure) ([place]) 11/21/2019  . Chills (without fever) 11/07/2019  . Hypercalcemia 10/17/2019  . Dependence on renal dialysis ([place]) 06/06/2019  . Coagulation defect, unspecified ([place]) 05/23/2019  . Iron deficiency anemia, unspecified 05/16/2019  . Anemia in other chronic diseases classified elsewhere 05/13/2019  . Arteriovenous fistula, acquired ([place]) 05/13/2019  . Body mass index (BMI) 28.0-28.9, adult 05/13/2019  . Crohn's disease of small intestine without complications ([place]) 16/07/9603  . Encounter for screening for respiratory tuberculosis 05/13/2019  . Gout, unspecified 05/13/2019  . Nausea 05/13/2019  . Other fatigue 05/13/2019  . Secondary hyperparathyroidism of renal origin ([place]) 05/13/2019  . End stage renal disease ([place]) 05/13/2019  . Chronic diastolic (congestive) heart failure ([place]) 05/13/2019  . Chronic kidney disease, unspecified 05/13/2019  . Hypertensive chronic kidney disease with stage 1 through stage 4 chronic kidney disease, or unspecified chronic kidney disease 05/13/2019  . Obstructive sleep apnea 05/13/2019  . Discomfort of right ear 02/18/2019  . Chronic kidney disease (CKD), stage IV (severe) ([place]) 06/15/2018  . Hypokalemia 06/13/2018  . Abnormal CT scan, small bowel   . Perennial allergic rhinitis with a predominant nonallergic component 02/09/2018  . Allergic conjunctivitis 02/09/2018  . Mild intermittent asthma 02/09/2018  . Atherosclerosis 12/09/2017  . Hypothyroidism 11/19/2017  . Mixed hyperlipidemia 10/13/2017  . Personal history of gout 10/13/2017  . S/P cholecystectomy 10/13/2017  . Congestive heart failure with  LV diastolic dysfunction, NYHA class 1 ([place]) 10/13/2017  . Hypothyroidism (acquired) 10/13/2017  . 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 SOB (shortness of breath) 06/07/2017  . Arthritis, gouty 01/27/2017  . Chronic diastolic CHF (congestive heart failure) ([place]) 01/06/2017  . OSA (obstructive sleep apnea) 01/06/2017  . Abnormal electrocardiogram (ECG) (EKG) 10/31/2016  . Chest pain 10/09/2016  . HIV disease ([place]) 10/09/2016  . Hypertensive heart disease with CHF (congestive heart failure) ([place]) 10/09/2016  . Chronic kidney disease, stage 4 (severe) ([place]) 05/14/2016  . Left-sided low back pain without sciatica 05/29/2015  . NYHA class 2 heart failure with preserved ejection fraction ([place]) 05/15/2015  . PAH (pulmonary artery hypertension) ([place]) 05/08/2015  . Hypertension 05/04/2015  . Non-rheumatic mitral regurgitation 05/04/2015  . Prolonged Q-T interval on ECG 05/04/2015  . Human immunodeficiency virus (HIV) disease ([place]) 05/04/2015   Past Medical History:  Diagnosis Date  . Anemia   . Aortic atherosclerosis ([place])   . Asthma   . Chronic  diastolic CHF (congestive heart failure) ([place]) 01/06/2017   Echo 7/16 [facility] in [place], [name]) Mild AI, mild LAE, mild concentric LVH, EF 55, normal wall motion, mild to moderate MR, mild PI, RVSP 55 // Echo 10/09/16 ([facility]):  Moderate LVH, grade 2 diastolic dysfunction, mild MR, moderate LAE   . CKD (chronic kidney disease)    per pt, waiting on a kidney transplant in near future.  . Esophagitis   . Gout   . Hepatitis    Hep B  . History of nuclear stress test    a. Nuc study 7/16: no scar or ischemia, EF 42 // b. Nuc study 12/17: EF 48, ?small apical ischemia, Low Risk  . HIV (human immunodeficiency virus infection) ([place])   . HLD (hyperlipidemia)   . Hypertension   . Hypertensive heart disease with CHF (congestive heart failure) ([place]) 10/09/2016  . Mitral regurgitation   . OSA (obstructive sleep apnea)   . Post-operative nausea  and vomiting    1 time as a child  . Sleep apnea    uses c-pap  . Wears glasses   . Wears partial dentures     Family History  Problem Relation Age of Onset  . Heart failure Mother   . Heart attack Maternal Grandmother 53  . Hypertension Sister   . Multiple sclerosis Sister   . Hypertension Brother   . Hypertension Sister   . Scoliosis Other   . Allergic rhinitis Neg Hx   . Angioedema Neg Hx   . Asthma Neg Hx   . Eczema Neg Hx   . Immunodeficiency Neg Hx   . Urticaria Neg Hx     Past Surgical History:  Procedure Laterality Date  . AV FISTULA PLACEMENT Left 07/02/2018   Procedure: Creation of Left arm BRACHIOBASILIC ARTERIOVENOUS  FISTULA;  Surgeon: Marty Heck, MD;  Location: [facility];  Service: Vascular;  Laterality: Left;  . BASCILIC VEIN TRANSPOSITION Left 08/27/2018   Procedure: Left arm BRACHIOBASILIC VEIN TRANSPOSITION SECOND STAGE;  Surgeon: Marty Heck, MD;  Location: [facility];  Service: Vascular;  Laterality: Left;  . CHOLECYSTECTOMY    . COLONOSCOPY W/ BIOPSIES AND POLYPECTOMY    . GIVENS CAPSULE STUDY N/A 03/01/2018   Procedure: GIVENS CAPSULE STUDY;  Surgeon: Jerene Bears, MD;  Location: [facility];  Service: Gastroenterology;  Laterality: N/A;  . HERNIA REPAIR     As baby  . MULTIPLE TOOTH EXTRACTIONS    . NOSE SURGERY    . RENAL BIOPSY     Social History   Occupational History  . Not on file  Tobacco Use  . Smoking status: Former Smoker    Types: Cigarettes    Quit date: 2000    Years since quitting: 21.3  . Smokeless tobacco: Never Used  Substance and Sexual Activity  . Alcohol use: Not Currently    Alcohol/week: 2.0 standard drinks    Types: 2 Shots of liquor per week  . Drug use: No  . Sexual activity: Never    Partners: Male    Birth control/protection: Condom    Comment: declined condoms

## 2020-03-09 DIAGNOSIS — N2581 Secondary hyperparathyroidism of renal origin: Secondary | ICD-10-CM | POA: Diagnosis not present

## 2020-03-09 DIAGNOSIS — Z992 Dependence on renal dialysis: Secondary | ICD-10-CM | POA: Diagnosis not present

## 2020-03-09 DIAGNOSIS — D689 Coagulation defect, unspecified: Secondary | ICD-10-CM | POA: Diagnosis not present

## 2020-03-09 DIAGNOSIS — N186 End stage renal disease: Secondary | ICD-10-CM | POA: Diagnosis not present

## 2020-03-09 DIAGNOSIS — E876 Hypokalemia: Secondary | ICD-10-CM | POA: Diagnosis not present

## 2020-03-12 ENCOUNTER — Other Ambulatory Visit: Payer: Self-pay

## 2020-03-12 DIAGNOSIS — E876 Hypokalemia: Secondary | ICD-10-CM | POA: Diagnosis not present

## 2020-03-12 DIAGNOSIS — N186 End stage renal disease: Secondary | ICD-10-CM | POA: Diagnosis not present

## 2020-03-12 DIAGNOSIS — Z992 Dependence on renal dialysis: Secondary | ICD-10-CM | POA: Diagnosis not present

## 2020-03-12 DIAGNOSIS — D689 Coagulation defect, unspecified: Secondary | ICD-10-CM | POA: Diagnosis not present

## 2020-03-12 DIAGNOSIS — N2581 Secondary hyperparathyroidism of renal origin: Secondary | ICD-10-CM | POA: Diagnosis not present

## 2020-03-12 MED ORDER — VITAMIN D (ERGOCALCIFEROL) 1.25 MG (50000 UNIT) PO CAPS: ORAL_CAPSULE | ORAL | 0 refills | Status: DC

## 2020-03-14 DIAGNOSIS — Z992 Dependence on renal dialysis: Secondary | ICD-10-CM | POA: Diagnosis not present

## 2020-03-14 DIAGNOSIS — E876 Hypokalemia: Secondary | ICD-10-CM | POA: Diagnosis not present

## 2020-03-14 DIAGNOSIS — N186 End stage renal disease: Secondary | ICD-10-CM | POA: Diagnosis not present

## 2020-03-14 DIAGNOSIS — D689 Coagulation defect, unspecified: Secondary | ICD-10-CM | POA: Diagnosis not present

## 2020-03-14 DIAGNOSIS — N2581 Secondary hyperparathyroidism of renal origin: Secondary | ICD-10-CM | POA: Diagnosis not present

## 2020-03-16 DIAGNOSIS — N2581 Secondary hyperparathyroidism of renal origin: Secondary | ICD-10-CM | POA: Diagnosis not present

## 2020-03-16 DIAGNOSIS — Z992 Dependence on renal dialysis: Secondary | ICD-10-CM | POA: Diagnosis not present

## 2020-03-16 DIAGNOSIS — E876 Hypokalemia: Secondary | ICD-10-CM | POA: Diagnosis not present

## 2020-03-16 DIAGNOSIS — N186 End stage renal disease: Secondary | ICD-10-CM | POA: Diagnosis not present

## 2020-03-16 DIAGNOSIS — D689 Coagulation defect, unspecified: Secondary | ICD-10-CM | POA: Diagnosis not present

## 2020-03-19 DIAGNOSIS — N2581 Secondary hyperparathyroidism of renal origin: Secondary | ICD-10-CM | POA: Diagnosis not present

## 2020-03-19 DIAGNOSIS — D689 Coagulation defect, unspecified: Secondary | ICD-10-CM | POA: Diagnosis not present

## 2020-03-19 DIAGNOSIS — Z992 Dependence on renal dialysis: Secondary | ICD-10-CM | POA: Diagnosis not present

## 2020-03-19 DIAGNOSIS — E876 Hypokalemia: Secondary | ICD-10-CM | POA: Diagnosis not present

## 2020-03-19 DIAGNOSIS — N186 End stage renal disease: Secondary | ICD-10-CM | POA: Diagnosis not present

## 2020-03-21 DIAGNOSIS — N186 End stage renal disease: Secondary | ICD-10-CM | POA: Diagnosis not present

## 2020-03-21 DIAGNOSIS — E876 Hypokalemia: Secondary | ICD-10-CM | POA: Diagnosis not present

## 2020-03-21 DIAGNOSIS — D689 Coagulation defect, unspecified: Secondary | ICD-10-CM | POA: Diagnosis not present

## 2020-03-21 DIAGNOSIS — N2581 Secondary hyperparathyroidism of renal origin: Secondary | ICD-10-CM | POA: Diagnosis not present

## 2020-03-21 DIAGNOSIS — Z992 Dependence on renal dialysis: Secondary | ICD-10-CM | POA: Diagnosis not present

## 2020-03-23 DIAGNOSIS — D689 Coagulation defect, unspecified: Secondary | ICD-10-CM | POA: Diagnosis not present

## 2020-03-23 DIAGNOSIS — N186 End stage renal disease: Secondary | ICD-10-CM | POA: Diagnosis not present

## 2020-03-23 DIAGNOSIS — Z992 Dependence on renal dialysis: Secondary | ICD-10-CM | POA: Diagnosis not present

## 2020-03-23 DIAGNOSIS — E876 Hypokalemia: Secondary | ICD-10-CM | POA: Diagnosis not present

## 2020-03-23 DIAGNOSIS — N2581 Secondary hyperparathyroidism of renal origin: Secondary | ICD-10-CM | POA: Diagnosis not present

## 2020-03-26 DIAGNOSIS — D689 Coagulation defect, unspecified: Secondary | ICD-10-CM | POA: Diagnosis not present

## 2020-03-26 DIAGNOSIS — N186 End stage renal disease: Secondary | ICD-10-CM | POA: Diagnosis not present

## 2020-03-26 DIAGNOSIS — Z992 Dependence on renal dialysis: Secondary | ICD-10-CM | POA: Diagnosis not present

## 2020-03-26 DIAGNOSIS — N2581 Secondary hyperparathyroidism of renal origin: Secondary | ICD-10-CM | POA: Diagnosis not present

## 2020-03-26 DIAGNOSIS — E876 Hypokalemia: Secondary | ICD-10-CM | POA: Diagnosis not present

## 2020-03-26 DIAGNOSIS — I129 Hypertensive chronic kidney disease with stage 1 through stage 4 chronic kidney disease, or unspecified chronic kidney disease: Secondary | ICD-10-CM | POA: Diagnosis not present

## 2020-03-28 DIAGNOSIS — N2581 Secondary hyperparathyroidism of renal origin: Secondary | ICD-10-CM | POA: Diagnosis not present

## 2020-03-28 DIAGNOSIS — D689 Coagulation defect, unspecified: Secondary | ICD-10-CM | POA: Diagnosis not present

## 2020-03-28 DIAGNOSIS — Z992 Dependence on renal dialysis: Secondary | ICD-10-CM | POA: Diagnosis not present

## 2020-03-28 DIAGNOSIS — N186 End stage renal disease: Secondary | ICD-10-CM | POA: Diagnosis not present

## 2020-03-30 DIAGNOSIS — N186 End stage renal disease: Secondary | ICD-10-CM | POA: Diagnosis not present

## 2020-03-30 DIAGNOSIS — N2581 Secondary hyperparathyroidism of renal origin: Secondary | ICD-10-CM | POA: Diagnosis not present

## 2020-03-30 DIAGNOSIS — Z992 Dependence on renal dialysis: Secondary | ICD-10-CM | POA: Diagnosis not present

## 2020-03-30 DIAGNOSIS — D689 Coagulation defect, unspecified: Secondary | ICD-10-CM | POA: Diagnosis not present

## 2020-04-02 DIAGNOSIS — Z4931 Encounter for adequacy testing for hemodialysis: Secondary | ICD-10-CM | POA: Diagnosis not present

## 2020-04-02 DIAGNOSIS — N2581 Secondary hyperparathyroidism of renal origin: Secondary | ICD-10-CM | POA: Diagnosis not present

## 2020-04-02 DIAGNOSIS — Z79899 Other long term (current) drug therapy: Secondary | ICD-10-CM | POA: Diagnosis not present

## 2020-04-02 DIAGNOSIS — D688 Other specified coagulation defects: Secondary | ICD-10-CM | POA: Diagnosis not present

## 2020-04-02 DIAGNOSIS — D509 Iron deficiency anemia, unspecified: Secondary | ICD-10-CM | POA: Diagnosis not present

## 2020-04-02 DIAGNOSIS — N186 End stage renal disease: Secondary | ICD-10-CM | POA: Diagnosis not present

## 2020-04-02 DIAGNOSIS — Z992 Dependence on renal dialysis: Secondary | ICD-10-CM | POA: Diagnosis not present

## 2020-04-02 DIAGNOSIS — R82998 Other abnormal findings in urine: Secondary | ICD-10-CM | POA: Diagnosis not present

## 2020-04-02 DIAGNOSIS — E440 Moderate protein-calorie malnutrition: Secondary | ICD-10-CM | POA: Diagnosis not present

## 2020-04-02 DIAGNOSIS — D513 Other dietary vitamin B12 deficiency anemia: Secondary | ICD-10-CM | POA: Diagnosis not present

## 2020-04-02 DIAGNOSIS — Z201 Contact with and (suspected) exposure to tuberculosis: Secondary | ICD-10-CM | POA: Insufficient documentation

## 2020-04-02 DIAGNOSIS — E8770 Fluid overload, unspecified: Secondary | ICD-10-CM | POA: Insufficient documentation

## 2020-04-02 DIAGNOSIS — D631 Anemia in chronic kidney disease: Secondary | ICD-10-CM | POA: Diagnosis not present

## 2020-04-02 DIAGNOSIS — R17 Unspecified jaundice: Secondary | ICD-10-CM | POA: Diagnosis not present

## 2020-04-03 DIAGNOSIS — R17 Unspecified jaundice: Secondary | ICD-10-CM | POA: Diagnosis not present

## 2020-04-03 DIAGNOSIS — N2581 Secondary hyperparathyroidism of renal origin: Secondary | ICD-10-CM | POA: Diagnosis not present

## 2020-04-03 DIAGNOSIS — R82998 Other abnormal findings in urine: Secondary | ICD-10-CM | POA: Diagnosis not present

## 2020-04-03 DIAGNOSIS — D509 Iron deficiency anemia, unspecified: Secondary | ICD-10-CM | POA: Diagnosis not present

## 2020-04-03 DIAGNOSIS — D631 Anemia in chronic kidney disease: Secondary | ICD-10-CM | POA: Diagnosis not present

## 2020-04-03 DIAGNOSIS — N186 End stage renal disease: Secondary | ICD-10-CM | POA: Diagnosis not present

## 2020-04-03 DIAGNOSIS — Z992 Dependence on renal dialysis: Secondary | ICD-10-CM | POA: Diagnosis not present

## 2020-04-03 DIAGNOSIS — Z4931 Encounter for adequacy testing for hemodialysis: Secondary | ICD-10-CM | POA: Diagnosis not present

## 2020-04-03 DIAGNOSIS — Z79899 Other long term (current) drug therapy: Secondary | ICD-10-CM | POA: Diagnosis not present

## 2020-04-03 DIAGNOSIS — E440 Moderate protein-calorie malnutrition: Secondary | ICD-10-CM | POA: Diagnosis not present

## 2020-04-03 DIAGNOSIS — D513 Other dietary vitamin B12 deficiency anemia: Secondary | ICD-10-CM | POA: Diagnosis not present

## 2020-04-03 DIAGNOSIS — D688 Other specified coagulation defects: Secondary | ICD-10-CM | POA: Diagnosis not present

## 2020-04-05 DIAGNOSIS — D509 Iron deficiency anemia, unspecified: Secondary | ICD-10-CM | POA: Diagnosis not present

## 2020-04-05 DIAGNOSIS — D631 Anemia in chronic kidney disease: Secondary | ICD-10-CM | POA: Diagnosis not present

## 2020-04-05 DIAGNOSIS — R17 Unspecified jaundice: Secondary | ICD-10-CM | POA: Diagnosis not present

## 2020-04-05 DIAGNOSIS — E440 Moderate protein-calorie malnutrition: Secondary | ICD-10-CM | POA: Diagnosis not present

## 2020-04-05 DIAGNOSIS — Z4931 Encounter for adequacy testing for hemodialysis: Secondary | ICD-10-CM | POA: Diagnosis not present

## 2020-04-05 DIAGNOSIS — R82998 Other abnormal findings in urine: Secondary | ICD-10-CM | POA: Diagnosis not present

## 2020-04-05 DIAGNOSIS — N2581 Secondary hyperparathyroidism of renal origin: Secondary | ICD-10-CM | POA: Diagnosis not present

## 2020-04-05 DIAGNOSIS — D688 Other specified coagulation defects: Secondary | ICD-10-CM | POA: Diagnosis not present

## 2020-04-05 DIAGNOSIS — Z79899 Other long term (current) drug therapy: Secondary | ICD-10-CM | POA: Diagnosis not present

## 2020-04-05 DIAGNOSIS — Z992 Dependence on renal dialysis: Secondary | ICD-10-CM | POA: Diagnosis not present

## 2020-04-05 DIAGNOSIS — D513 Other dietary vitamin B12 deficiency anemia: Secondary | ICD-10-CM | POA: Diagnosis not present

## 2020-04-05 DIAGNOSIS — N186 End stage renal disease: Secondary | ICD-10-CM | POA: Diagnosis not present

## 2020-04-06 DIAGNOSIS — N186 End stage renal disease: Secondary | ICD-10-CM | POA: Diagnosis not present

## 2020-04-06 DIAGNOSIS — D688 Other specified coagulation defects: Secondary | ICD-10-CM | POA: Diagnosis not present

## 2020-04-06 DIAGNOSIS — D513 Other dietary vitamin B12 deficiency anemia: Secondary | ICD-10-CM | POA: Diagnosis not present

## 2020-04-06 DIAGNOSIS — D631 Anemia in chronic kidney disease: Secondary | ICD-10-CM | POA: Diagnosis not present

## 2020-04-06 DIAGNOSIS — N2581 Secondary hyperparathyroidism of renal origin: Secondary | ICD-10-CM | POA: Diagnosis not present

## 2020-04-06 DIAGNOSIS — Z4931 Encounter for adequacy testing for hemodialysis: Secondary | ICD-10-CM | POA: Diagnosis not present

## 2020-04-06 DIAGNOSIS — R82998 Other abnormal findings in urine: Secondary | ICD-10-CM | POA: Diagnosis not present

## 2020-04-06 DIAGNOSIS — D509 Iron deficiency anemia, unspecified: Secondary | ICD-10-CM | POA: Diagnosis not present

## 2020-04-06 DIAGNOSIS — R17 Unspecified jaundice: Secondary | ICD-10-CM | POA: Diagnosis not present

## 2020-04-06 DIAGNOSIS — Z79899 Other long term (current) drug therapy: Secondary | ICD-10-CM | POA: Diagnosis not present

## 2020-04-06 DIAGNOSIS — Z992 Dependence on renal dialysis: Secondary | ICD-10-CM | POA: Diagnosis not present

## 2020-04-06 DIAGNOSIS — E440 Moderate protein-calorie malnutrition: Secondary | ICD-10-CM | POA: Diagnosis not present

## 2020-04-09 ENCOUNTER — Ambulatory Visit: Payer: Medicare Other | Admitting: Allergy and Immunology

## 2020-04-09 DIAGNOSIS — E440 Moderate protein-calorie malnutrition: Secondary | ICD-10-CM | POA: Diagnosis not present

## 2020-04-09 DIAGNOSIS — N2581 Secondary hyperparathyroidism of renal origin: Secondary | ICD-10-CM | POA: Diagnosis not present

## 2020-04-09 DIAGNOSIS — D509 Iron deficiency anemia, unspecified: Secondary | ICD-10-CM | POA: Diagnosis not present

## 2020-04-09 DIAGNOSIS — Z992 Dependence on renal dialysis: Secondary | ICD-10-CM | POA: Diagnosis not present

## 2020-04-09 DIAGNOSIS — R82998 Other abnormal findings in urine: Secondary | ICD-10-CM | POA: Diagnosis not present

## 2020-04-09 DIAGNOSIS — D688 Other specified coagulation defects: Secondary | ICD-10-CM | POA: Diagnosis not present

## 2020-04-09 DIAGNOSIS — D631 Anemia in chronic kidney disease: Secondary | ICD-10-CM | POA: Diagnosis not present

## 2020-04-09 DIAGNOSIS — D513 Other dietary vitamin B12 deficiency anemia: Secondary | ICD-10-CM | POA: Diagnosis not present

## 2020-04-09 DIAGNOSIS — Z4931 Encounter for adequacy testing for hemodialysis: Secondary | ICD-10-CM | POA: Diagnosis not present

## 2020-04-09 DIAGNOSIS — Z79899 Other long term (current) drug therapy: Secondary | ICD-10-CM | POA: Diagnosis not present

## 2020-04-09 DIAGNOSIS — R17 Unspecified jaundice: Secondary | ICD-10-CM | POA: Diagnosis not present

## 2020-04-09 DIAGNOSIS — N186 End stage renal disease: Secondary | ICD-10-CM | POA: Diagnosis not present

## 2020-04-10 DIAGNOSIS — Z992 Dependence on renal dialysis: Secondary | ICD-10-CM | POA: Diagnosis not present

## 2020-04-10 DIAGNOSIS — Z79899 Other long term (current) drug therapy: Secondary | ICD-10-CM | POA: Diagnosis not present

## 2020-04-10 DIAGNOSIS — D631 Anemia in chronic kidney disease: Secondary | ICD-10-CM | POA: Diagnosis not present

## 2020-04-10 DIAGNOSIS — E440 Moderate protein-calorie malnutrition: Secondary | ICD-10-CM | POA: Diagnosis not present

## 2020-04-10 DIAGNOSIS — N2581 Secondary hyperparathyroidism of renal origin: Secondary | ICD-10-CM | POA: Diagnosis not present

## 2020-04-10 DIAGNOSIS — N186 End stage renal disease: Secondary | ICD-10-CM | POA: Diagnosis not present

## 2020-04-10 DIAGNOSIS — D513 Other dietary vitamin B12 deficiency anemia: Secondary | ICD-10-CM | POA: Diagnosis not present

## 2020-04-10 DIAGNOSIS — D509 Iron deficiency anemia, unspecified: Secondary | ICD-10-CM | POA: Diagnosis not present

## 2020-04-10 DIAGNOSIS — R17 Unspecified jaundice: Secondary | ICD-10-CM | POA: Diagnosis not present

## 2020-04-10 DIAGNOSIS — D688 Other specified coagulation defects: Secondary | ICD-10-CM | POA: Diagnosis not present

## 2020-04-10 DIAGNOSIS — Z4931 Encounter for adequacy testing for hemodialysis: Secondary | ICD-10-CM | POA: Diagnosis not present

## 2020-04-10 DIAGNOSIS — R82998 Other abnormal findings in urine: Secondary | ICD-10-CM | POA: Diagnosis not present

## 2020-04-12 ENCOUNTER — Ambulatory Visit: Payer: Medicare Other | Admitting: Allergy & Immunology

## 2020-04-12 DIAGNOSIS — Z4931 Encounter for adequacy testing for hemodialysis: Secondary | ICD-10-CM | POA: Diagnosis not present

## 2020-04-12 DIAGNOSIS — N2581 Secondary hyperparathyroidism of renal origin: Secondary | ICD-10-CM | POA: Diagnosis not present

## 2020-04-12 DIAGNOSIS — D509 Iron deficiency anemia, unspecified: Secondary | ICD-10-CM | POA: Diagnosis not present

## 2020-04-12 DIAGNOSIS — R82998 Other abnormal findings in urine: Secondary | ICD-10-CM | POA: Diagnosis not present

## 2020-04-12 DIAGNOSIS — Z79899 Other long term (current) drug therapy: Secondary | ICD-10-CM | POA: Diagnosis not present

## 2020-04-12 DIAGNOSIS — D631 Anemia in chronic kidney disease: Secondary | ICD-10-CM | POA: Diagnosis not present

## 2020-04-12 DIAGNOSIS — E440 Moderate protein-calorie malnutrition: Secondary | ICD-10-CM | POA: Diagnosis not present

## 2020-04-12 DIAGNOSIS — N186 End stage renal disease: Secondary | ICD-10-CM | POA: Diagnosis not present

## 2020-04-12 DIAGNOSIS — Z992 Dependence on renal dialysis: Secondary | ICD-10-CM | POA: Diagnosis not present

## 2020-04-12 DIAGNOSIS — R17 Unspecified jaundice: Secondary | ICD-10-CM | POA: Diagnosis not present

## 2020-04-12 DIAGNOSIS — D688 Other specified coagulation defects: Secondary | ICD-10-CM | POA: Diagnosis not present

## 2020-04-12 DIAGNOSIS — D513 Other dietary vitamin B12 deficiency anemia: Secondary | ICD-10-CM | POA: Diagnosis not present

## 2020-04-16 DIAGNOSIS — R17 Unspecified jaundice: Secondary | ICD-10-CM | POA: Diagnosis not present

## 2020-04-16 DIAGNOSIS — R82998 Other abnormal findings in urine: Secondary | ICD-10-CM | POA: Diagnosis not present

## 2020-04-16 DIAGNOSIS — E440 Moderate protein-calorie malnutrition: Secondary | ICD-10-CM | POA: Diagnosis not present

## 2020-04-16 DIAGNOSIS — D631 Anemia in chronic kidney disease: Secondary | ICD-10-CM | POA: Diagnosis not present

## 2020-04-16 DIAGNOSIS — Z4931 Encounter for adequacy testing for hemodialysis: Secondary | ICD-10-CM | POA: Diagnosis not present

## 2020-04-16 DIAGNOSIS — Z992 Dependence on renal dialysis: Secondary | ICD-10-CM | POA: Diagnosis not present

## 2020-04-16 DIAGNOSIS — N186 End stage renal disease: Secondary | ICD-10-CM | POA: Diagnosis not present

## 2020-04-16 DIAGNOSIS — D509 Iron deficiency anemia, unspecified: Secondary | ICD-10-CM | POA: Diagnosis not present

## 2020-04-16 DIAGNOSIS — N2581 Secondary hyperparathyroidism of renal origin: Secondary | ICD-10-CM | POA: Diagnosis not present

## 2020-04-16 DIAGNOSIS — D513 Other dietary vitamin B12 deficiency anemia: Secondary | ICD-10-CM | POA: Diagnosis not present

## 2020-04-16 DIAGNOSIS — D688 Other specified coagulation defects: Secondary | ICD-10-CM | POA: Diagnosis not present

## 2020-04-16 DIAGNOSIS — Z79899 Other long term (current) drug therapy: Secondary | ICD-10-CM | POA: Diagnosis not present

## 2020-04-17 DIAGNOSIS — Z992 Dependence on renal dialysis: Secondary | ICD-10-CM | POA: Diagnosis not present

## 2020-04-17 DIAGNOSIS — E440 Moderate protein-calorie malnutrition: Secondary | ICD-10-CM | POA: Diagnosis not present

## 2020-04-17 DIAGNOSIS — D631 Anemia in chronic kidney disease: Secondary | ICD-10-CM | POA: Diagnosis not present

## 2020-04-17 DIAGNOSIS — D688 Other specified coagulation defects: Secondary | ICD-10-CM | POA: Diagnosis not present

## 2020-04-17 DIAGNOSIS — Z4931 Encounter for adequacy testing for hemodialysis: Secondary | ICD-10-CM | POA: Diagnosis not present

## 2020-04-17 DIAGNOSIS — R82998 Other abnormal findings in urine: Secondary | ICD-10-CM | POA: Diagnosis not present

## 2020-04-17 DIAGNOSIS — Z79899 Other long term (current) drug therapy: Secondary | ICD-10-CM | POA: Diagnosis not present

## 2020-04-17 DIAGNOSIS — R17 Unspecified jaundice: Secondary | ICD-10-CM | POA: Diagnosis not present

## 2020-04-17 DIAGNOSIS — N2581 Secondary hyperparathyroidism of renal origin: Secondary | ICD-10-CM | POA: Diagnosis not present

## 2020-04-17 DIAGNOSIS — N186 End stage renal disease: Secondary | ICD-10-CM | POA: Diagnosis not present

## 2020-04-17 DIAGNOSIS — D509 Iron deficiency anemia, unspecified: Secondary | ICD-10-CM | POA: Diagnosis not present

## 2020-04-17 DIAGNOSIS — D513 Other dietary vitamin B12 deficiency anemia: Secondary | ICD-10-CM | POA: Diagnosis not present

## 2020-04-19 DIAGNOSIS — R17 Unspecified jaundice: Secondary | ICD-10-CM | POA: Diagnosis not present

## 2020-04-19 DIAGNOSIS — Z4931 Encounter for adequacy testing for hemodialysis: Secondary | ICD-10-CM | POA: Diagnosis not present

## 2020-04-19 DIAGNOSIS — Z992 Dependence on renal dialysis: Secondary | ICD-10-CM | POA: Diagnosis not present

## 2020-04-19 DIAGNOSIS — D509 Iron deficiency anemia, unspecified: Secondary | ICD-10-CM | POA: Diagnosis not present

## 2020-04-19 DIAGNOSIS — N2581 Secondary hyperparathyroidism of renal origin: Secondary | ICD-10-CM | POA: Diagnosis not present

## 2020-04-19 DIAGNOSIS — E440 Moderate protein-calorie malnutrition: Secondary | ICD-10-CM | POA: Diagnosis not present

## 2020-04-19 DIAGNOSIS — D631 Anemia in chronic kidney disease: Secondary | ICD-10-CM | POA: Diagnosis not present

## 2020-04-19 DIAGNOSIS — R82998 Other abnormal findings in urine: Secondary | ICD-10-CM | POA: Diagnosis not present

## 2020-04-19 DIAGNOSIS — D688 Other specified coagulation defects: Secondary | ICD-10-CM | POA: Diagnosis not present

## 2020-04-19 DIAGNOSIS — N186 End stage renal disease: Secondary | ICD-10-CM | POA: Diagnosis not present

## 2020-04-19 DIAGNOSIS — Z79899 Other long term (current) drug therapy: Secondary | ICD-10-CM | POA: Diagnosis not present

## 2020-04-19 DIAGNOSIS — D513 Other dietary vitamin B12 deficiency anemia: Secondary | ICD-10-CM | POA: Diagnosis not present

## 2020-04-23 DIAGNOSIS — D631 Anemia in chronic kidney disease: Secondary | ICD-10-CM | POA: Diagnosis not present

## 2020-04-23 DIAGNOSIS — Z79899 Other long term (current) drug therapy: Secondary | ICD-10-CM | POA: Diagnosis not present

## 2020-04-23 DIAGNOSIS — Z4931 Encounter for adequacy testing for hemodialysis: Secondary | ICD-10-CM | POA: Diagnosis not present

## 2020-04-23 DIAGNOSIS — N186 End stage renal disease: Secondary | ICD-10-CM | POA: Diagnosis not present

## 2020-04-23 DIAGNOSIS — Z992 Dependence on renal dialysis: Secondary | ICD-10-CM | POA: Diagnosis not present

## 2020-04-23 DIAGNOSIS — R17 Unspecified jaundice: Secondary | ICD-10-CM | POA: Diagnosis not present

## 2020-04-23 DIAGNOSIS — R82998 Other abnormal findings in urine: Secondary | ICD-10-CM | POA: Diagnosis not present

## 2020-04-23 DIAGNOSIS — N2581 Secondary hyperparathyroidism of renal origin: Secondary | ICD-10-CM | POA: Diagnosis not present

## 2020-04-23 DIAGNOSIS — D509 Iron deficiency anemia, unspecified: Secondary | ICD-10-CM | POA: Diagnosis not present

## 2020-04-23 DIAGNOSIS — D513 Other dietary vitamin B12 deficiency anemia: Secondary | ICD-10-CM | POA: Diagnosis not present

## 2020-04-23 DIAGNOSIS — E440 Moderate protein-calorie malnutrition: Secondary | ICD-10-CM | POA: Diagnosis not present

## 2020-04-23 DIAGNOSIS — D688 Other specified coagulation defects: Secondary | ICD-10-CM | POA: Diagnosis not present

## 2020-04-24 DIAGNOSIS — D688 Other specified coagulation defects: Secondary | ICD-10-CM | POA: Diagnosis not present

## 2020-04-24 DIAGNOSIS — Z4931 Encounter for adequacy testing for hemodialysis: Secondary | ICD-10-CM | POA: Diagnosis not present

## 2020-04-24 DIAGNOSIS — D513 Other dietary vitamin B12 deficiency anemia: Secondary | ICD-10-CM | POA: Diagnosis not present

## 2020-04-24 DIAGNOSIS — Z992 Dependence on renal dialysis: Secondary | ICD-10-CM | POA: Diagnosis not present

## 2020-04-24 DIAGNOSIS — D631 Anemia in chronic kidney disease: Secondary | ICD-10-CM | POA: Diagnosis not present

## 2020-04-24 DIAGNOSIS — E440 Moderate protein-calorie malnutrition: Secondary | ICD-10-CM | POA: Diagnosis not present

## 2020-04-24 DIAGNOSIS — N2581 Secondary hyperparathyroidism of renal origin: Secondary | ICD-10-CM | POA: Diagnosis not present

## 2020-04-24 DIAGNOSIS — D509 Iron deficiency anemia, unspecified: Secondary | ICD-10-CM | POA: Diagnosis not present

## 2020-04-24 DIAGNOSIS — Z79899 Other long term (current) drug therapy: Secondary | ICD-10-CM | POA: Diagnosis not present

## 2020-04-24 DIAGNOSIS — R17 Unspecified jaundice: Secondary | ICD-10-CM | POA: Diagnosis not present

## 2020-04-24 DIAGNOSIS — N186 End stage renal disease: Secondary | ICD-10-CM | POA: Diagnosis not present

## 2020-04-24 DIAGNOSIS — R82998 Other abnormal findings in urine: Secondary | ICD-10-CM | POA: Diagnosis not present

## 2020-04-25 DIAGNOSIS — I129 Hypertensive chronic kidney disease with stage 1 through stage 4 chronic kidney disease, or unspecified chronic kidney disease: Secondary | ICD-10-CM | POA: Diagnosis not present

## 2020-04-25 DIAGNOSIS — N186 End stage renal disease: Secondary | ICD-10-CM | POA: Diagnosis not present

## 2020-04-25 DIAGNOSIS — Z992 Dependence on renal dialysis: Secondary | ICD-10-CM | POA: Diagnosis not present

## 2020-04-26 ENCOUNTER — Ambulatory Visit: Payer: Medicare Other | Admitting: Allergy & Immunology

## 2020-04-26 DIAGNOSIS — E7849 Other hyperlipidemia: Secondary | ICD-10-CM | POA: Diagnosis not present

## 2020-04-26 DIAGNOSIS — Z992 Dependence on renal dialysis: Secondary | ICD-10-CM | POA: Diagnosis not present

## 2020-04-26 DIAGNOSIS — N2589 Other disorders resulting from impaired renal tubular function: Secondary | ICD-10-CM | POA: Diagnosis not present

## 2020-04-26 DIAGNOSIS — E440 Moderate protein-calorie malnutrition: Secondary | ICD-10-CM | POA: Diagnosis not present

## 2020-04-26 DIAGNOSIS — N186 End stage renal disease: Secondary | ICD-10-CM | POA: Diagnosis not present

## 2020-04-26 DIAGNOSIS — N2581 Secondary hyperparathyroidism of renal origin: Secondary | ICD-10-CM | POA: Diagnosis not present

## 2020-04-26 DIAGNOSIS — Z79899 Other long term (current) drug therapy: Secondary | ICD-10-CM | POA: Diagnosis not present

## 2020-04-26 DIAGNOSIS — D509 Iron deficiency anemia, unspecified: Secondary | ICD-10-CM | POA: Diagnosis not present

## 2020-04-26 DIAGNOSIS — R82998 Other abnormal findings in urine: Secondary | ICD-10-CM | POA: Diagnosis not present

## 2020-04-27 DIAGNOSIS — E440 Moderate protein-calorie malnutrition: Secondary | ICD-10-CM | POA: Diagnosis not present

## 2020-04-27 DIAGNOSIS — N186 End stage renal disease: Secondary | ICD-10-CM | POA: Diagnosis not present

## 2020-04-27 DIAGNOSIS — Z79899 Other long term (current) drug therapy: Secondary | ICD-10-CM | POA: Diagnosis not present

## 2020-04-27 DIAGNOSIS — R82998 Other abnormal findings in urine: Secondary | ICD-10-CM | POA: Diagnosis not present

## 2020-04-27 DIAGNOSIS — N2581 Secondary hyperparathyroidism of renal origin: Secondary | ICD-10-CM | POA: Diagnosis not present

## 2020-04-27 DIAGNOSIS — D509 Iron deficiency anemia, unspecified: Secondary | ICD-10-CM | POA: Diagnosis not present

## 2020-04-27 DIAGNOSIS — E7849 Other hyperlipidemia: Secondary | ICD-10-CM | POA: Diagnosis not present

## 2020-04-27 DIAGNOSIS — N2589 Other disorders resulting from impaired renal tubular function: Secondary | ICD-10-CM | POA: Diagnosis not present

## 2020-04-27 DIAGNOSIS — Z992 Dependence on renal dialysis: Secondary | ICD-10-CM | POA: Diagnosis not present

## 2020-04-30 DIAGNOSIS — Z79899 Other long term (current) drug therapy: Secondary | ICD-10-CM | POA: Diagnosis not present

## 2020-04-30 DIAGNOSIS — E440 Moderate protein-calorie malnutrition: Secondary | ICD-10-CM | POA: Diagnosis not present

## 2020-04-30 DIAGNOSIS — R82998 Other abnormal findings in urine: Secondary | ICD-10-CM | POA: Diagnosis not present

## 2020-04-30 DIAGNOSIS — E7849 Other hyperlipidemia: Secondary | ICD-10-CM | POA: Diagnosis not present

## 2020-04-30 DIAGNOSIS — N2581 Secondary hyperparathyroidism of renal origin: Secondary | ICD-10-CM | POA: Diagnosis not present

## 2020-04-30 DIAGNOSIS — D509 Iron deficiency anemia, unspecified: Secondary | ICD-10-CM | POA: Diagnosis not present

## 2020-04-30 DIAGNOSIS — Z992 Dependence on renal dialysis: Secondary | ICD-10-CM | POA: Diagnosis not present

## 2020-04-30 DIAGNOSIS — N186 End stage renal disease: Secondary | ICD-10-CM | POA: Diagnosis not present

## 2020-04-30 DIAGNOSIS — N2589 Other disorders resulting from impaired renal tubular function: Secondary | ICD-10-CM | POA: Diagnosis not present

## 2020-05-01 ENCOUNTER — Other Ambulatory Visit: Payer: Self-pay

## 2020-05-01 ENCOUNTER — Encounter: Payer: Self-pay | Admitting: Family Medicine

## 2020-05-01 ENCOUNTER — Ambulatory Visit (INDEPENDENT_AMBULATORY_CARE_PROVIDER_SITE_OTHER): Payer: Medicare Other | Admitting: Family Medicine

## 2020-05-01 VITALS — BP 112/80 | HR 78 | Temp 97.9°F | Ht 70.0 in | Wt 206.0 lb

## 2020-05-01 DIAGNOSIS — D509 Iron deficiency anemia, unspecified: Secondary | ICD-10-CM | POA: Diagnosis not present

## 2020-05-01 DIAGNOSIS — N2581 Secondary hyperparathyroidism of renal origin: Secondary | ICD-10-CM | POA: Diagnosis not present

## 2020-05-01 DIAGNOSIS — E038 Other specified hypothyroidism: Secondary | ICD-10-CM | POA: Diagnosis not present

## 2020-05-01 DIAGNOSIS — I151 Hypertension secondary to other renal disorders: Secondary | ICD-10-CM | POA: Diagnosis not present

## 2020-05-01 DIAGNOSIS — Z131 Encounter for screening for diabetes mellitus: Secondary | ICD-10-CM

## 2020-05-01 DIAGNOSIS — E440 Moderate protein-calorie malnutrition: Secondary | ICD-10-CM | POA: Diagnosis not present

## 2020-05-01 DIAGNOSIS — M25561 Pain in right knee: Secondary | ICD-10-CM

## 2020-05-01 DIAGNOSIS — E7849 Other hyperlipidemia: Secondary | ICD-10-CM | POA: Diagnosis not present

## 2020-05-01 DIAGNOSIS — B20 Human immunodeficiency virus [HIV] disease: Secondary | ICD-10-CM

## 2020-05-01 DIAGNOSIS — G8929 Other chronic pain: Secondary | ICD-10-CM

## 2020-05-01 DIAGNOSIS — N184 Chronic kidney disease, stage 4 (severe): Secondary | ICD-10-CM

## 2020-05-01 DIAGNOSIS — N2889 Other specified disorders of kidney and ureter: Secondary | ICD-10-CM

## 2020-05-01 DIAGNOSIS — N2589 Other disorders resulting from impaired renal tubular function: Secondary | ICD-10-CM | POA: Diagnosis not present

## 2020-05-01 DIAGNOSIS — Z992 Dependence on renal dialysis: Secondary | ICD-10-CM | POA: Diagnosis not present

## 2020-05-01 DIAGNOSIS — R82998 Other abnormal findings in urine: Secondary | ICD-10-CM | POA: Diagnosis not present

## 2020-05-01 DIAGNOSIS — N186 End stage renal disease: Secondary | ICD-10-CM | POA: Diagnosis not present

## 2020-05-01 DIAGNOSIS — Z79899 Other long term (current) drug therapy: Secondary | ICD-10-CM | POA: Diagnosis not present

## 2020-05-01 LAB — POCT GLYCOSYLATED HEMOGLOBIN (HGB A1C)
HbA1c POC (<> result, manual entry): 5.3 % (ref 4.0–5.6)
HbA1c, POC (controlled diabetic range): 5.3 % (ref 0.0–7.0)
HbA1c, POC (prediabetic range): 5.3 % — AB (ref 5.7–6.4)
Hemoglobin A1C: 5.3 % (ref 4.0–5.6)

## 2020-05-01 NOTE — Progress Notes (Signed)
Patient [facility]    Subjective:  Patient ID: Albert Eaton, male    DOB: 1966-08-18  Age: 54 y.o. MRN: [id number]  CC:  Chief Complaint  Patient presents with   Follow-up    4 month follow up, no concerns    HPI Tank Difiore Fussner is a 54 year old male with a medical history significant for hypertension, CHF with LV diastolic dysfunction NYHA class I, of lateral sclerosis, obstructive sleep apnea, hypothyroidism, neuropathy, HIV disease, and hypertensive chronic kidney disease presents for follow-up of chronic conditions.  Patient states that he has been doing well and is without complaint.  He has transition to home dialysis 3 days/week.  He is followed consistently by nephrologist.  Patient also has a long history of HIV disease that is well controlled.  He is followed every 6 months by infectious disease and is continued on antiretroviral therapy. Patient has a history of hypertension.  Blood pressure has been well controlled on current medication regimen.  He is also followed by cardiology.  He denies any headache, chest pain, shortness of breath, or lower extremity swelling. Patient has an ongoing history of bilateral knee and ankle pain.  He is followed closely by orthopedic services.  He has had cortisone injections in the past without sustained relief. Patient has a history of hypothyroidism that has been well controlled on current medication regimen.  He denies intolerance to heat or cold, constipation, diarrhea, excessive sweating, depression, or anxiety. Patient Active Problem List   Diagnosis Date Noted   Pulsatile tinnitus of right ear [phone]] 11/21/2019   Kidney failure [N19] 11/21/2019   CHF (congestive heart failure) ([place]) [I50.9] 11/21/2019   Chills (without fever) [R68.83] 11/07/2019   Hypercalcemia [E83.52] 10/17/2019   Dependence on renal dialysis ([place]) [Z99.2] 06/06/2019   Coagulation defect, unspecified ([place]) [D68.9]  05/23/2019   Iron deficiency anemia, unspecified [D50.9] 05/16/2019   Anemia in other chronic diseases classified elsewhere [D63.8] 05/13/2019   Arteriovenous fistula, acquired ([place]) [I77.0] 05/13/2019   Body mass index (BMI) 28.0-28.9, adult [Z68.28] 05/13/2019   Crohn's disease of small intestine without complications ([place]) [M21.11] 05/13/2019   Encounter for screening for respiratory tuberculosis [Z11.1] 05/13/2019   Gout, unspecified [M10.9] 05/13/2019   Nausea [R11.0] 05/13/2019   Other fatigue [R53.83] 05/13/2019   Secondary hyperparathyroidism of renal origin ([place]) [N25.81] 05/13/2019   End stage renal disease ([place]) [N18.6] 05/13/2019   Chronic diastolic (congestive) heart failure ([place]) [I50.32] 05/13/2019   Chronic kidney disease, unspecified [N18.9] 05/13/2019   Hypertensive chronic kidney disease with stage 1 through stage 4 chronic kidney disease, or unspecified chronic kidney disease [I12.9] 05/13/2019   Obstructive sleep apnea [G47.33] 05/13/2019   Discomfort of right ear [H92.01] 02/18/2019   Chronic kidney disease (CKD), stage IV (severe) ([place]) [N18.4] 06/15/2018   Hypokalemia [E87.6] 06/13/2018   Abnormal CT scan, small bowel [R93.3]    Perennial allergic rhinitis with a predominant nonallergic component [J30.89] 02/09/2018   Allergic conjunctivitis [H10.10] 02/09/2018   Mild intermittent asthma [J45.20] 02/09/2018   Atherosclerosis [I70.90] 12/09/2017   Hypothyroidism [E03.9] 11/19/2017   Mixed hyperlipidemia [E78.2] 10/13/2017   Personal history of gout [Z87.39] 10/13/2017   S/P cholecystectomy [Z90.49] 10/13/2017   Congestive heart failure with LV diastolic dysfunction, NYHA class 1 ([place]) [I50.30] 10/13/2017   Hypothyroidism (acquired) [E03.9] 10/13/2017   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[place]) [B20, G63] 07/31/2017   SOB (shortness of breath) [R06.02] 06/07/2017   Arthritis, gouty [M10.9] 01/27/2017   Chronic diastolic  CHF (congestive heart  failure) (HCC) [I50.32] 01/06/2017   OSA (obstructive sleep apnea) [G47.33] 01/06/2017   Abnormal electrocardiogram (ECG) (EKG) [R94.31] 10/31/2016   Chest pain [R07.9] 10/09/2016   HIV disease ([place]) [B20] 10/09/2016   Hypertensive heart disease with CHF (congestive heart failure) ([place]) [I11.0] 10/09/2016   Chronic kidney disease, stage 4 (severe) ([place]) [N18.4] 05/14/2016   Left-sided low back pain without sciatica [M54.5] 05/29/2015   NYHA class 2 heart failure with preserved ejection fraction ([place]) [I50.30] 05/15/2015   PAH (pulmonary artery hypertension) ([place]) [I27.21] 05/08/2015   Hypertension [I10] 05/04/2015   Non-rheumatic mitral regurgitation [I34.0] 05/04/2015   Prolonged Q-T interval on ECG [R94.31] 05/04/2015   Human immunodeficiency virus (HIV) disease ([place]) [B20] 05/04/2015    Past Medical History:  Diagnosis Date   Anemia    Aortic atherosclerosis (HCC)    Asthma    Chronic diastolic CHF (congestive heart failure) ([place]) 01/06/2017   Echo 7/16 [facility] in [place], [name]) Mild AI, mild LAE, mild concentric LVH, EF 55, normal wall motion, mild to moderate MR, mild PI, RVSP 55 // Echo 10/09/16 ([facility]):  Moderate LVH, grade 2 diastolic dysfunction, mild MR, moderate LAE    CKD (chronic kidney disease)    per pt, waiting on a kidney transplant in near future.   Esophagitis    Gout    Hepatitis    Hep B   History of nuclear stress test    a. Nuc study 7/16: no scar or ischemia, EF 42 // b. Nuc study 12/17: EF 48, ?small apical ischemia, Low Risk   HIV (human immunodeficiency virus infection) ([place])    HLD (hyperlipidemia)    Hypertension    Hypertensive heart disease with CHF (congestive heart failure) ([place]) 10/09/2016   Mitral regurgitation    OSA (obstructive sleep apnea)    Post-operative nausea and vomiting    1 time as a child   Sleep apnea    uses c-pap   Wears glasses    Wears partial dentures     Past Surgical History:    Procedure Laterality Date   AV FISTULA PLACEMENT Left 07/02/2018   Procedure: Creation of Left arm BRACHIOBASILIC ARTERIOVENOUS  FISTULA;  Surgeon: Marty Heck, MD;  Location: [facility];  Service: Vascular;  Laterality: Left;   [place] Left 08/27/2018   Procedure: Left arm BRACHIOBASILIC VEIN TRANSPOSITION SECOND STAGE;  Surgeon: Marty Heck, MD;  Location: [facility];  Service: Vascular;  Laterality: Left;   CHOLECYSTECTOMY     COLONOSCOPY W/ BIOPSIES AND POLYPECTOMY     GIVENS CAPSULE STUDY N/A 03/01/2018   Procedure: GIVENS CAPSULE STUDY;  Surgeon: Jerene Bears, MD;  Location: [facility];  Service: Gastroenterology;  Laterality: N/A;   HERNIA REPAIR     As baby   MULTIPLE TOOTH EXTRACTIONS     NOSE SURGERY     RENAL BIOPSY      Family History  Problem Relation Age of Onset   Heart failure Mother    Heart attack Maternal Grandmother 14   Hypertension Sister    Multiple sclerosis Sister    Hypertension Brother    Hypertension Sister    Scoliosis Other    Allergic rhinitis Neg Hx    Angioedema Neg Hx    Asthma Neg Hx    Eczema Neg Hx    Immunodeficiency Neg Hx    Urticaria Neg Hx     Social History   Socioeconomic History   Marital status: Single  Spouse name: Not on file   Number of children: Not on file   Years of education: Not on file   Highest education level: Not on file  Occupational History   Not on file  Tobacco Use   Smoking status: Former Smoker    Types: Cigarettes    Quit date: 2000    Years since quitting: 21.5   Smokeless tobacco: Never Used  Scientific laboratory technician Use: Never used  Substance and Sexual Activity   Alcohol use: Not Currently    Alcohol/week: 2.0 standard drinks    Types: 2 Shots of liquor per week    Comment: occ   Drug use: No   Sexual activity: Never    Partners: Male    Birth control/protection: Condom    Comment: declined condoms  Other Topics Concern   Not on  file  Social History Narrative   Not on file   Social Determinants of Health   Financial Resource Strain:    Difficulty of Paying Living Expenses:   Food Insecurity:    Worried About Charity fundraiser in the Last Year:    Arboriculturist in the Last Year:   Transportation Needs:    Film/video editor (Medical):    Lack of Transportation (Non-Medical):   Physical Activity:    Days of Exercise per Week:    Minutes of Exercise per Session:   Stress:    Feeling of Stress :   Social Connections:    Frequency of Communication with Friends and Family:    Frequency of Social Gatherings with Friends and Family:    Attends Religious Services:    Active Member of Clubs or Organizations:    Attends Music therapist:    Marital Status:   Intimate Partner Violence:    Fear of Current or Ex-Partner:    Emotionally Abused:    Physically Abused:    Sexually Abused:     Outpatient Medications Prior to Visit  Medication Sig Dispense Refill   abacavir (ZIAGEN) 300 MG tablet Take 1 tablet (300 mg total) by mouth daily. 30 tablet 3   albuterol (PROAIR HFA) 108 (90 Base) MCG/ACT inhaler Inhale 2 puffs into the lungs every 4 (four) hours as needed for wheezing or shortness of breath. 1 Inhaler 3   allopurinol (ZYLOPRIM) 100 MG tablet Take 1 tablet (100 mg total) by mouth 2 (two) times daily. 60 tablet 3   amLODipine (NORVASC) 10 MG tablet Take 1 tablet (10 mg total) by mouth daily. 30 tablet 1   Ascorbic Acid (VITAMIN C WITH ROSE HIPS) 1000 MG tablet Take 1,000 mg by mouth daily.     azelastine (ASTELIN) 0.1 % nasal spray Place 2 sprays into both nostrils 2 (two) times daily as needed for rhinitis. Use in each nostril as directed 30 mL 5   B Complex-C-Zn-Folic Acid (DIALYVITE/ZINC) TABS TK 1 T PO QPM     carvedilol (COREG) 25 MG tablet TAKE 1 TABLET BY MOUTH TWICE DAILY WITH FOOD 60 tablet 11   cloNIDine (CATAPRES) 0.1 MG tablet TAKE 1 TABLET(0.1 MG)  BY MOUTH TWICE DAILY 180 tablet 3   famotidine (PEPCID) 20 MG tablet TAKE 1 TABLET(20 MG) BY MOUTH TWICE DAILY 180 tablet 3   fluticasone (FLOVENT HFA) 110 MCG/ACT inhaler Inhale 2 puffs into the lungs 2 (two) times daily. 1 Inhaler 5   gabapentin (NEURONTIN) 300 MG capsule TAKE 1 CAPSULE(300 MG) BY MOUTH THREE TIMES DAILY 90 capsule 3  hydrALAZINE (APRESOLINE) 100 MG tablet TAKE 1 TABLET BY MOUTH THREE TIMES DAILY 270 tablet 3   hyoscyamine (LEVSIN SL) 0.125 MG SL tablet Place 1-2 tablets (0.125-0.25 mg total) under the tongue every 8 (eight) hours as needed (lower abdominal pain, spasm). 90 tablet 2   ipratropium (ATROVENT) 0.06 % nasal spray 2 sprays per nostril 2 to 3 times daily as needed 15 mL 5   lamivudine (EPIVIR) 100 MG tablet TAKE 1 TABLET(100 MG) BY MOUTH DAILY 30 tablet 3   levothyroxine (SYNTHROID) 25 MCG tablet Take 1 tablet by mouth daily before breakfast 30 tablet 3   lidocaine-prilocaine (EMLA) cream APPLY SMALL AMOUNT TO ACCESS SITE (AVF) 1 TO 2 HOURS BEFORE DIALYSIS. COVER WITH OCCLUSIVE DRESSING (SARAN WRAP)     lubiprostone (AMITIZA) 24 MCG capsule Take 1 capsule (24 mcg total) by mouth 2 (two) times daily with a meal. 60 capsule 3   Olopatadine HCl (PAZEO) 0.7 % SOLN Place 1 drop into both eyes daily. 1 Bottle 5   raltegravir (ISENTRESS) 400 MG tablet Take 1 tablet (400 mg total) by mouth 2 (two) times daily. 60 tablet 3   Spacer/Aero-Holding Chambers DEVI 1 Device by Does not apply route 2 (two) times a day. 1 each 1   Turmeric 500 MG CAPS Take 500 mg by mouth daily.     Vitamin D, Ergocalciferol, (DRISDOL) 1.25 MG (50000 UNIT) CAPS capsule TAKE 1 CAPSULE BY MOUTH EVERY 7 DAYS 8 capsule 0   vitamin E (VITAMIN E) 180 MG (400 UNITS) capsule Take 400 Units by mouth daily.     zinc gluconate 50 MG tablet Take 50 mg by mouth daily.      predniSONE (DELTASONE) 10 MG tablet Take 1 tablet (10 mg total) by mouth daily with breakfast. 30 tablet 0   No  facility-administered medications prior to visit.    Allergies  Allergen Reactions   Ace Inhibitors Cough and Other (See Comments)   Lisinopril Cough    ROS Review of Systems  Constitutional: Negative for diaphoresis, fatigue and fever.  HENT: Negative.   Respiratory: Negative.   Cardiovascular: Negative.  Negative for chest pain and leg swelling.  Endocrine: Negative for polydipsia, polyphagia and polyuria.  Genitourinary: Negative.   Musculoskeletal: Positive for arthralgias.  Skin: Negative.   Neurological: Negative.   Hematological: Negative.   Psychiatric/Behavioral: Negative.       Objective:    Physical Exam Constitutional:      Appearance: Normal appearance.  HENT:     Mouth/Throat:     Mouth: Mucous membranes are moist.     Pharynx: Oropharynx is clear.  Eyes:     Pupils: Pupils are equal, round, and reactive to light.  Cardiovascular:     Rate and Rhythm: Normal rate.     Pulses: Normal pulses.     Heart sounds: Normal heart sounds.  Pulmonary:     Effort: Pulmonary effort is normal.  Abdominal:     General: Abdomen is flat. Bowel sounds are normal.  Musculoskeletal:        General: Normal range of motion.  Skin:    General: Skin is warm.  Neurological:     General: No focal deficit present.     Mental Status: He is alert. Mental status is at baseline.  Psychiatric:        Mood and Affect: Mood normal.        Thought Content: Thought content normal.        Judgment: Judgment normal.  BP 112/80 (BP Location: Right Arm, Patient Position: Sitting, Cuff Size: Large)    Pulse 78    Temp 97.9 F (36.6 C)    Ht 5' 10"  (1.778 m)    Wt 206 lb (93.4 kg)    SpO2 99%    BMI 29.56 kg/m  Wt Readings from Last 3 Encounters:  05/01/20 206 lb (93.4 kg)  03/08/20 200 lb (90.7 kg)  02/02/20 200 lb (90.7 kg)     There are no preventive care reminders to display for this patient.  There are no preventive care reminders to display for this  patient.  Lab Results  Component Value Date   TSH 2.580 12/27/2019   Lab Results  Component Value Date   WBC 3.9 10/06/2019   HGB 11.4 (L) 10/06/2019   HCT 32.8 (L) 10/06/2019   MCV 95.1 10/06/2019   PLT 213 10/06/2019   Lab Results  Component Value Date   NA 141 12/27/2019   K 3.8 12/27/2019   CO2 24 12/27/2019   GLUCOSE 100 (H) 12/27/2019   BUN 42 (H) 12/27/2019   CREATININE 4.03 (H) 12/27/2019   BILITOT 0.4 10/06/2019   ALKPHOS 92 09/20/2018   AST 12 10/06/2019   ALT 13 10/06/2019   PROT 7.2 10/06/2019   ALBUMIN 4.7 09/20/2018   CALCIUM 10.2 12/27/2019   ANIONGAP 11 06/14/2018   Lab Results  Component Value Date   CHOL 159 10/06/2019   Lab Results  Component Value Date   HDL 35 (L) 10/06/2019   Lab Results  Component Value Date   LDLCALC 103 (H) 10/06/2019   Lab Results  Component Value Date   TRIG 117 10/06/2019   Lab Results  Component Value Date   CHOLHDL 4.5 10/06/2019   Lab Results  Component Value Date   HGBA1C 5.1 10/31/2016      Assessment & Plan:   Problem List Items Addressed This Visit      Cardiovascular and Mediastinum   Hypertension - Primary     Hypertension secondary to other renal disorders BP 112/80 (BP Location: Right Arm, Patient Position: Sitting, Cuff Size: Large)    Pulse 78    Temp 97.9 F (36.6 C)    Ht 5' 10"  (1.778 m)    Wt 206 lb (93.4 kg)    SpO2 99%    BMI 29.56 kg/m  Continue medication, monitor blood pressure at home. Continue DASH diet. Reminder to go to the ER if any CP, SOB, nausea, dizziness, severe HA, changes vision/speech, left arm numbness and tingling and jaw pain.   Diabetes mellitus screening Within normal range, will repeat in 1 year - HgB A1c   Other specified hypothyroidism We will follow-up with patient by phone with lab results. - TSH  HIV disease ([place]) Continue to follow-up with infectious disease as scheduled.  He is scheduled for labs in 6 months.  Chronic kidney disease (CKD),  stage IV (severe) (HCC) Patient has a history of end-stage chronic kidney disease.  He undergoes dialysis 3 days/week  Chronic pain of right knee Advised to schedule follow-up with orthopedic services  Follow-up: 6 months for chronic conditions   Bernadette Armijo Al [place]  APRN, MSN, FNP-C [facility] [address] [place], [place] [address] [phone]

## 2020-05-02 LAB — TSH: TSH: 2.29 u[IU]/mL (ref 0.450–4.500)

## 2020-05-03 DIAGNOSIS — R82998 Other abnormal findings in urine: Secondary | ICD-10-CM | POA: Diagnosis not present

## 2020-05-03 DIAGNOSIS — N2581 Secondary hyperparathyroidism of renal origin: Secondary | ICD-10-CM | POA: Diagnosis not present

## 2020-05-03 DIAGNOSIS — Z992 Dependence on renal dialysis: Secondary | ICD-10-CM | POA: Diagnosis not present

## 2020-05-03 DIAGNOSIS — Z79899 Other long term (current) drug therapy: Secondary | ICD-10-CM | POA: Diagnosis not present

## 2020-05-03 DIAGNOSIS — E7849 Other hyperlipidemia: Secondary | ICD-10-CM | POA: Diagnosis not present

## 2020-05-03 DIAGNOSIS — N186 End stage renal disease: Secondary | ICD-10-CM | POA: Diagnosis not present

## 2020-05-03 DIAGNOSIS — N2589 Other disorders resulting from impaired renal tubular function: Secondary | ICD-10-CM | POA: Diagnosis not present

## 2020-05-03 DIAGNOSIS — D509 Iron deficiency anemia, unspecified: Secondary | ICD-10-CM | POA: Diagnosis not present

## 2020-05-03 DIAGNOSIS — E440 Moderate protein-calorie malnutrition: Secondary | ICD-10-CM | POA: Diagnosis not present

## 2020-05-04 DIAGNOSIS — Z992 Dependence on renal dialysis: Secondary | ICD-10-CM | POA: Diagnosis not present

## 2020-05-04 DIAGNOSIS — Z79899 Other long term (current) drug therapy: Secondary | ICD-10-CM | POA: Diagnosis not present

## 2020-05-04 DIAGNOSIS — D509 Iron deficiency anemia, unspecified: Secondary | ICD-10-CM | POA: Diagnosis not present

## 2020-05-04 DIAGNOSIS — N2589 Other disorders resulting from impaired renal tubular function: Secondary | ICD-10-CM | POA: Diagnosis not present

## 2020-05-04 DIAGNOSIS — E7849 Other hyperlipidemia: Secondary | ICD-10-CM | POA: Diagnosis not present

## 2020-05-04 DIAGNOSIS — N186 End stage renal disease: Secondary | ICD-10-CM | POA: Diagnosis not present

## 2020-05-04 DIAGNOSIS — N2581 Secondary hyperparathyroidism of renal origin: Secondary | ICD-10-CM | POA: Diagnosis not present

## 2020-05-04 DIAGNOSIS — E440 Moderate protein-calorie malnutrition: Secondary | ICD-10-CM | POA: Diagnosis not present

## 2020-05-04 DIAGNOSIS — R82998 Other abnormal findings in urine: Secondary | ICD-10-CM | POA: Diagnosis not present

## 2020-05-07 ENCOUNTER — Other Ambulatory Visit: Payer: Self-pay | Admitting: Family Medicine

## 2020-05-07 DIAGNOSIS — R82998 Other abnormal findings in urine: Secondary | ICD-10-CM | POA: Diagnosis not present

## 2020-05-07 DIAGNOSIS — Z79899 Other long term (current) drug therapy: Secondary | ICD-10-CM | POA: Diagnosis not present

## 2020-05-07 DIAGNOSIS — N186 End stage renal disease: Secondary | ICD-10-CM | POA: Diagnosis not present

## 2020-05-07 DIAGNOSIS — N2589 Other disorders resulting from impaired renal tubular function: Secondary | ICD-10-CM | POA: Diagnosis not present

## 2020-05-07 DIAGNOSIS — D509 Iron deficiency anemia, unspecified: Secondary | ICD-10-CM | POA: Diagnosis not present

## 2020-05-07 DIAGNOSIS — E7849 Other hyperlipidemia: Secondary | ICD-10-CM | POA: Diagnosis not present

## 2020-05-07 DIAGNOSIS — Z992 Dependence on renal dialysis: Secondary | ICD-10-CM | POA: Diagnosis not present

## 2020-05-07 DIAGNOSIS — E440 Moderate protein-calorie malnutrition: Secondary | ICD-10-CM | POA: Diagnosis not present

## 2020-05-07 DIAGNOSIS — N2581 Secondary hyperparathyroidism of renal origin: Secondary | ICD-10-CM | POA: Diagnosis not present

## 2020-05-07 MED ORDER — VITAMIN D (ERGOCALCIFEROL) 1.25 MG (50000 UNIT) PO CAPS: ORAL_CAPSULE | ORAL | 0 refills | Status: DC

## 2020-05-08 ENCOUNTER — Telehealth: Payer: Self-pay | Admitting: Family Medicine

## 2020-05-08 DIAGNOSIS — E440 Moderate protein-calorie malnutrition: Secondary | ICD-10-CM | POA: Diagnosis not present

## 2020-05-08 DIAGNOSIS — R82998 Other abnormal findings in urine: Secondary | ICD-10-CM | POA: Diagnosis not present

## 2020-05-08 DIAGNOSIS — N186 End stage renal disease: Secondary | ICD-10-CM | POA: Diagnosis not present

## 2020-05-08 DIAGNOSIS — Z79899 Other long term (current) drug therapy: Secondary | ICD-10-CM | POA: Diagnosis not present

## 2020-05-08 DIAGNOSIS — N2581 Secondary hyperparathyroidism of renal origin: Secondary | ICD-10-CM | POA: Diagnosis not present

## 2020-05-08 DIAGNOSIS — Z992 Dependence on renal dialysis: Secondary | ICD-10-CM | POA: Diagnosis not present

## 2020-05-08 DIAGNOSIS — E7849 Other hyperlipidemia: Secondary | ICD-10-CM | POA: Diagnosis not present

## 2020-05-08 DIAGNOSIS — N2589 Other disorders resulting from impaired renal tubular function: Secondary | ICD-10-CM | POA: Diagnosis not present

## 2020-05-08 DIAGNOSIS — D509 Iron deficiency anemia, unspecified: Secondary | ICD-10-CM | POA: Diagnosis not present

## 2020-05-08 NOTE — Telephone Encounter (Signed)
-----   Message from [name], [place] sent at 05/02/2020  2:23 PM EDT ----- Regarding: lab results Please inform patient that TSH is within a normal range. Follow up in office as scheduled.    [name]  APRN, MSN, FNP-C [facility] [address] [place], [place] [address] [phone]

## 2020-05-08 NOTE — Telephone Encounter (Signed)
Patient notified of results, verbally understood. No additional questions.

## 2020-05-10 DIAGNOSIS — N2581 Secondary hyperparathyroidism of renal origin: Secondary | ICD-10-CM | POA: Diagnosis not present

## 2020-05-10 DIAGNOSIS — R82998 Other abnormal findings in urine: Secondary | ICD-10-CM | POA: Diagnosis not present

## 2020-05-10 DIAGNOSIS — E440 Moderate protein-calorie malnutrition: Secondary | ICD-10-CM | POA: Diagnosis not present

## 2020-05-10 DIAGNOSIS — Z79899 Other long term (current) drug therapy: Secondary | ICD-10-CM | POA: Diagnosis not present

## 2020-05-10 DIAGNOSIS — N186 End stage renal disease: Secondary | ICD-10-CM | POA: Diagnosis not present

## 2020-05-10 DIAGNOSIS — N2589 Other disorders resulting from impaired renal tubular function: Secondary | ICD-10-CM | POA: Diagnosis not present

## 2020-05-10 DIAGNOSIS — E7849 Other hyperlipidemia: Secondary | ICD-10-CM | POA: Diagnosis not present

## 2020-05-10 DIAGNOSIS — Z992 Dependence on renal dialysis: Secondary | ICD-10-CM | POA: Diagnosis not present

## 2020-05-10 DIAGNOSIS — D509 Iron deficiency anemia, unspecified: Secondary | ICD-10-CM | POA: Diagnosis not present

## 2020-05-11 DIAGNOSIS — Z992 Dependence on renal dialysis: Secondary | ICD-10-CM | POA: Diagnosis not present

## 2020-05-11 DIAGNOSIS — Z79899 Other long term (current) drug therapy: Secondary | ICD-10-CM | POA: Diagnosis not present

## 2020-05-11 DIAGNOSIS — E7849 Other hyperlipidemia: Secondary | ICD-10-CM | POA: Diagnosis not present

## 2020-05-11 DIAGNOSIS — D509 Iron deficiency anemia, unspecified: Secondary | ICD-10-CM | POA: Diagnosis not present

## 2020-05-11 DIAGNOSIS — N186 End stage renal disease: Secondary | ICD-10-CM | POA: Diagnosis not present

## 2020-05-11 DIAGNOSIS — R82998 Other abnormal findings in urine: Secondary | ICD-10-CM | POA: Diagnosis not present

## 2020-05-11 DIAGNOSIS — E440 Moderate protein-calorie malnutrition: Secondary | ICD-10-CM | POA: Diagnosis not present

## 2020-05-11 DIAGNOSIS — N2589 Other disorders resulting from impaired renal tubular function: Secondary | ICD-10-CM | POA: Diagnosis not present

## 2020-05-11 DIAGNOSIS — N2581 Secondary hyperparathyroidism of renal origin: Secondary | ICD-10-CM | POA: Diagnosis not present

## 2020-05-14 DIAGNOSIS — E440 Moderate protein-calorie malnutrition: Secondary | ICD-10-CM | POA: Diagnosis not present

## 2020-05-14 DIAGNOSIS — Z992 Dependence on renal dialysis: Secondary | ICD-10-CM | POA: Diagnosis not present

## 2020-05-14 DIAGNOSIS — R82998 Other abnormal findings in urine: Secondary | ICD-10-CM | POA: Diagnosis not present

## 2020-05-14 DIAGNOSIS — E7849 Other hyperlipidemia: Secondary | ICD-10-CM | POA: Diagnosis not present

## 2020-05-14 DIAGNOSIS — N2581 Secondary hyperparathyroidism of renal origin: Secondary | ICD-10-CM | POA: Diagnosis not present

## 2020-05-14 DIAGNOSIS — N2589 Other disorders resulting from impaired renal tubular function: Secondary | ICD-10-CM | POA: Diagnosis not present

## 2020-05-14 DIAGNOSIS — Z79899 Other long term (current) drug therapy: Secondary | ICD-10-CM | POA: Diagnosis not present

## 2020-05-14 DIAGNOSIS — D509 Iron deficiency anemia, unspecified: Secondary | ICD-10-CM | POA: Diagnosis not present

## 2020-05-14 DIAGNOSIS — N186 End stage renal disease: Secondary | ICD-10-CM | POA: Diagnosis not present

## 2020-05-15 DIAGNOSIS — R82998 Other abnormal findings in urine: Secondary | ICD-10-CM | POA: Diagnosis not present

## 2020-05-15 DIAGNOSIS — N2581 Secondary hyperparathyroidism of renal origin: Secondary | ICD-10-CM | POA: Diagnosis not present

## 2020-05-15 DIAGNOSIS — N186 End stage renal disease: Secondary | ICD-10-CM | POA: Diagnosis not present

## 2020-05-15 DIAGNOSIS — E440 Moderate protein-calorie malnutrition: Secondary | ICD-10-CM | POA: Diagnosis not present

## 2020-05-15 DIAGNOSIS — Z79899 Other long term (current) drug therapy: Secondary | ICD-10-CM | POA: Diagnosis not present

## 2020-05-15 DIAGNOSIS — N2589 Other disorders resulting from impaired renal tubular function: Secondary | ICD-10-CM | POA: Diagnosis not present

## 2020-05-15 DIAGNOSIS — E7849 Other hyperlipidemia: Secondary | ICD-10-CM | POA: Diagnosis not present

## 2020-05-15 DIAGNOSIS — D509 Iron deficiency anemia, unspecified: Secondary | ICD-10-CM | POA: Diagnosis not present

## 2020-05-15 DIAGNOSIS — Z992 Dependence on renal dialysis: Secondary | ICD-10-CM | POA: Diagnosis not present

## 2020-05-17 DIAGNOSIS — Z992 Dependence on renal dialysis: Secondary | ICD-10-CM | POA: Diagnosis not present

## 2020-05-17 DIAGNOSIS — N2589 Other disorders resulting from impaired renal tubular function: Secondary | ICD-10-CM | POA: Diagnosis not present

## 2020-05-17 DIAGNOSIS — R82998 Other abnormal findings in urine: Secondary | ICD-10-CM | POA: Diagnosis not present

## 2020-05-17 DIAGNOSIS — E440 Moderate protein-calorie malnutrition: Secondary | ICD-10-CM | POA: Diagnosis not present

## 2020-05-17 DIAGNOSIS — N186 End stage renal disease: Secondary | ICD-10-CM | POA: Diagnosis not present

## 2020-05-17 DIAGNOSIS — N2581 Secondary hyperparathyroidism of renal origin: Secondary | ICD-10-CM | POA: Diagnosis not present

## 2020-05-17 DIAGNOSIS — D509 Iron deficiency anemia, unspecified: Secondary | ICD-10-CM | POA: Diagnosis not present

## 2020-05-17 DIAGNOSIS — E7849 Other hyperlipidemia: Secondary | ICD-10-CM | POA: Diagnosis not present

## 2020-05-17 DIAGNOSIS — Z79899 Other long term (current) drug therapy: Secondary | ICD-10-CM | POA: Diagnosis not present

## 2020-05-18 DIAGNOSIS — D509 Iron deficiency anemia, unspecified: Secondary | ICD-10-CM | POA: Diagnosis not present

## 2020-05-18 DIAGNOSIS — N2589 Other disorders resulting from impaired renal tubular function: Secondary | ICD-10-CM | POA: Diagnosis not present

## 2020-05-18 DIAGNOSIS — N186 End stage renal disease: Secondary | ICD-10-CM | POA: Diagnosis not present

## 2020-05-18 DIAGNOSIS — R82998 Other abnormal findings in urine: Secondary | ICD-10-CM | POA: Diagnosis not present

## 2020-05-18 DIAGNOSIS — N2581 Secondary hyperparathyroidism of renal origin: Secondary | ICD-10-CM | POA: Diagnosis not present

## 2020-05-18 DIAGNOSIS — Z992 Dependence on renal dialysis: Secondary | ICD-10-CM | POA: Diagnosis not present

## 2020-05-18 DIAGNOSIS — E7849 Other hyperlipidemia: Secondary | ICD-10-CM | POA: Diagnosis not present

## 2020-05-18 DIAGNOSIS — E440 Moderate protein-calorie malnutrition: Secondary | ICD-10-CM | POA: Diagnosis not present

## 2020-05-18 DIAGNOSIS — Z79899 Other long term (current) drug therapy: Secondary | ICD-10-CM | POA: Diagnosis not present

## 2020-05-21 ENCOUNTER — Other Ambulatory Visit: Payer: Self-pay

## 2020-05-21 ENCOUNTER — Ambulatory Visit (INDEPENDENT_AMBULATORY_CARE_PROVIDER_SITE_OTHER): Payer: Medicare Other | Admitting: Orthopedic Surgery

## 2020-05-21 ENCOUNTER — Encounter: Payer: Self-pay | Admitting: Orthopedic Surgery

## 2020-05-21 DIAGNOSIS — D509 Iron deficiency anemia, unspecified: Secondary | ICD-10-CM | POA: Diagnosis not present

## 2020-05-21 DIAGNOSIS — G8929 Other chronic pain: Secondary | ICD-10-CM

## 2020-05-21 DIAGNOSIS — N2581 Secondary hyperparathyroidism of renal origin: Secondary | ICD-10-CM | POA: Diagnosis not present

## 2020-05-21 DIAGNOSIS — Z79899 Other long term (current) drug therapy: Secondary | ICD-10-CM | POA: Diagnosis not present

## 2020-05-21 DIAGNOSIS — M25561 Pain in right knee: Secondary | ICD-10-CM

## 2020-05-21 DIAGNOSIS — E039 Hypothyroidism, unspecified: Secondary | ICD-10-CM

## 2020-05-21 DIAGNOSIS — R82998 Other abnormal findings in urine: Secondary | ICD-10-CM | POA: Diagnosis not present

## 2020-05-21 DIAGNOSIS — E7849 Other hyperlipidemia: Secondary | ICD-10-CM | POA: Diagnosis not present

## 2020-05-21 DIAGNOSIS — E440 Moderate protein-calorie malnutrition: Secondary | ICD-10-CM | POA: Diagnosis not present

## 2020-05-21 DIAGNOSIS — Z992 Dependence on renal dialysis: Secondary | ICD-10-CM | POA: Diagnosis not present

## 2020-05-21 DIAGNOSIS — N186 End stage renal disease: Secondary | ICD-10-CM | POA: Diagnosis not present

## 2020-05-21 DIAGNOSIS — N2589 Other disorders resulting from impaired renal tubular function: Secondary | ICD-10-CM | POA: Diagnosis not present

## 2020-05-21 MED ORDER — METHYLPREDNISOLONE ACETATE 40 MG/ML IJ SUSP
40.0000 mg | INTRAMUSCULAR | Status: AC | PRN
  Administered 2020-05-21: 40 mg via INTRA_ARTICULAR

## 2020-05-21 MED ORDER — LIDOCAINE HCL (PF) 1 % IJ SOLN
5.0000 mL | INTRAMUSCULAR | Status: AC | PRN
  Administered 2020-05-21: 5 mL

## 2020-05-21 MED ORDER — GABAPENTIN 300 MG PO CAPS: ORAL_CAPSULE | ORAL | 3 refills | Status: DC

## 2020-05-21 MED ORDER — LEVOTHYROXINE SODIUM 25 MCG PO TABS: ORAL_TABLET | ORAL | 3 refills | Status: DC

## 2020-05-21 NOTE — Progress Notes (Signed)
Office Visit Note   Patient: Albert Eaton           Date of Birth: Jan 19, 1966           MRN: [id number] Visit Date: 05/21/2020              Requested by: [name], FNP [address],  [place] [address] PCP: [name], FNP  Chief Complaint  Patient presents with  . Right Knee - Pain      HPI: Patient is a 54 year old gentleman who presents in follow-up for right shoulder pain right knee pain and right ankle pain. Patient feels like he has instability with his knee and ankle secondary to the pain. He states that he had a little bit of relief with the right shoulder injection but he states that his shoulder still bothers him. Patient  Is on dialysis.  Assessment & Plan: Visit Diagnoses:  1. Chronic pain of right knee     Plan: Patient tolerated the right knee injection well he states he would like to follow-up as needed.  Follow-Up Instructions: Return if symptoms worsen or fail to improve.   Ortho Exam  Patient is alert, oriented, no adenopathy, well-dressed, normal affect, normal respiratory effort. Examination patient does have a moderate effusion of the right knee he is primarily tender to palpation of the medial joint line without mechanical symptoms there is no crepitation with range of motion collaterals and cruciates are stable his previous MRI scan was reviewed which did show an effusion. Patient has been using Voltaren gel on the knee for the past week.  Imaging: No results found. No images are attached to the encounter.  Labs: Lab Results  Component Value Date   HGBA1C 5.3 05/01/2020   HGBA1C 5.3 05/01/2020   HGBA1C 5.3 (A) 05/01/2020   HGBA1C 5.3 05/01/2020   ESRSEDRATE 60 (H) 01/01/2017   CRP 112.4 (H) 01/01/2017   LABURIC 8.1 (H) 03/30/2017   LABURIC 7.1 01/27/2017   LABURIC 8.7 (H) 01/01/2017   REPTSTATUS 06/12/2017 FINAL 06/07/2017   CULT NO GROWTH 5 DAYS 06/07/2017   LABORGA KLEBSIELLA PNEUMONIAE (A) 05/31/2017      Lab Results  Component Value Date   ALBUMIN 4.7 09/20/2018   ALBUMIN 4.6 07/08/2018   ALBUMIN 4.1 12/01/2017   LABURIC 8.1 (H) 03/30/2017   LABURIC 7.1 01/27/2017   LABURIC 8.7 (H) 01/01/2017    Lab Results  Component Value Date   MG 2.3 06/13/2018   MG 1.6 (L) 06/08/2017   MG 2.2 01/06/2017   Lab Results  Component Value Date   VD25OH 14.9 (L) 07/08/2018    No results found for: PREALBUMIN CBC EXTENDED Latest Ref Rng & Units 10/06/2019 11/22/2018 09/20/2018  WBC 3.8 - 10.8 Thousand/uL 3.9 6.0 6.0  RBC 4.20 - 5.80 Million/uL 3.45(L) 4.18(L) 3.71(L)  HGB 13.2 - 17.1 g/dL 11.4(L) 12.5(L) 11.3(L)  HCT 38 - 50 % 32.8(L) 36.3(L) 32.8(L)  PLT 140 - 400 Thousand/uL 213 283 301  NEUTROABS 1 - 7 x10E3/uL - - 2.8  LYMPHSABS 0 - 3 x10E3/uL - - 2.2     There is no height or weight on file to calculate BMI.  Orders:  No orders of the defined types were placed in this encounter.  No orders of the defined types were placed in this encounter.    Procedures: Large Joint Inj: R knee on 05/21/2020 4:25 PM Indications: pain and diagnostic evaluation Details: 22 G 1.5 in needle, anteromedial  approach  Arthrogram: No  Medications: 5 mL lidocaine (PF) 1 %; 40 mg methylPREDNISolone acetate 40 MG/ML Outcome: tolerated well, no immediate complications Procedure, treatment alternatives, risks and benefits explained, specific risks discussed. Consent was given by the patient. Immediately prior to procedure a time out was called to verify the correct patient, procedure, equipment, support staff and site/side marked as required. Patient was prepped and draped in the usual sterile fashion.      Clinical Data: No additional findings.  ROS:  All other systems negative, except as noted in the HPI. Review of Systems  Objective: Vital Signs: There were no vitals taken for this visit.  Specialty Comments:  No specialty comments available.  PMFS History: Patient Active Problem  List   Diagnosis Date Noted  . Pulsatile tinnitus of right ear 11/21/2019  . Kidney failure 11/21/2019  . CHF (congestive heart failure) ([place]) 11/21/2019  . Chills (without fever) 11/07/2019  . Hypercalcemia 10/17/2019  . Dependence on renal dialysis ([place]) 06/06/2019  . Coagulation defect, unspecified ([place]) 05/23/2019  . Iron deficiency anemia, unspecified 05/16/2019  . Anemia in other chronic diseases classified elsewhere 05/13/2019  . Arteriovenous fistula, acquired ([place]) 05/13/2019  . Body mass index (BMI) 28.0-28.9, adult 05/13/2019  . Crohn's disease of small intestine without complications ([place]) 16/07/9603  . Encounter for screening for respiratory tuberculosis 05/13/2019  . Gout, unspecified 05/13/2019  . Nausea 05/13/2019  . Other fatigue 05/13/2019  . Secondary hyperparathyroidism of renal origin ([place]) 05/13/2019  . End stage renal disease ([place]) 05/13/2019  . Chronic diastolic (congestive) heart failure ([place]) 05/13/2019  . Chronic kidney disease, unspecified 05/13/2019  . Hypertensive chronic kidney disease with stage 1 through stage 4 chronic kidney disease, or unspecified chronic kidney disease 05/13/2019  . Obstructive sleep apnea 05/13/2019  . Discomfort of right ear 02/18/2019  . Chronic kidney disease (CKD), stage IV (severe) ([place]) 06/15/2018  . Hypokalemia 06/13/2018  . Abnormal CT scan, small bowel   . Perennial allergic rhinitis with a predominant nonallergic component 02/09/2018  . Allergic conjunctivitis 02/09/2018  . Mild intermittent asthma 02/09/2018  . Atherosclerosis 12/09/2017  . Hypothyroidism 11/19/2017  . Mixed hyperlipidemia 10/13/2017  . Personal history of gout 10/13/2017  . S/P cholecystectomy 10/13/2017  . Congestive heart failure with LV diastolic dysfunction, NYHA class 1 ([place]) 10/13/2017  . Hypothyroidism (acquired) 10/13/2017  . 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 SOB (shortness of breath) 06/07/2017  . Arthritis, gouty 01/27/2017   . Chronic diastolic CHF (congestive heart failure) ([place]) 01/06/2017  . OSA (obstructive sleep apnea) 01/06/2017  . Abnormal electrocardiogram (ECG) (EKG) 10/31/2016  . Chest pain 10/09/2016  . HIV disease ([place]) 10/09/2016  . Hypertensive heart disease with CHF (congestive heart failure) ([place]) 10/09/2016  . Chronic kidney disease, stage 4 (severe) ([place]) 05/14/2016  . Left-sided low back pain without sciatica 05/29/2015  . NYHA class 2 heart failure with preserved ejection fraction ([place]) 05/15/2015  . PAH (pulmonary artery hypertension) ([place]) 05/08/2015  . Hypertension 05/04/2015  . Non-rheumatic mitral regurgitation 05/04/2015  . Prolonged Q-T interval on ECG 05/04/2015  . Human immunodeficiency virus (HIV) disease ([place]) 05/04/2015   Past Medical History:  Diagnosis Date  . Anemia   . Aortic atherosclerosis ([place])   . Asthma   . Chronic diastolic CHF (congestive heart failure) ([place]) 01/06/2017   Echo 7/16 [facility] in [place], [name]) Mild AI, mild LAE, mild concentric LVH, EF 55, normal wall motion, mild to moderate MR, mild PI, RVSP 55 // Echo 10/09/16 (  [facility]):  Moderate LVH, grade 2 diastolic dysfunction, mild MR, moderate LAE   . CKD (chronic kidney disease)    per pt, waiting on a kidney transplant in near future.  . Esophagitis   . Gout   . Hepatitis    Hep B  . History of nuclear stress test    a. Nuc study 7/16: no scar or ischemia, EF 42 // b. Nuc study 12/17: EF 48, ?small apical ischemia, Low Risk  . HIV (human immunodeficiency virus infection) ([place])   . HLD (hyperlipidemia)   . Hypertension   . Hypertensive heart disease with CHF (congestive heart failure) ([place]) 10/09/2016  . Mitral regurgitation   . OSA (obstructive sleep apnea)   . Post-operative nausea and vomiting    1 time as a child  . Sleep apnea    uses c-pap  . Wears glasses   . Wears partial dentures     Family History  Problem Relation Age of Onset  . Heart failure Mother   . Heart attack  Maternal Grandmother 53  . Hypertension Sister   . Multiple sclerosis Sister   . Hypertension Brother   . Hypertension Sister   . Scoliosis Other   . Allergic rhinitis Neg Hx   . Angioedema Neg Hx   . Asthma Neg Hx   . Eczema Neg Hx   . Immunodeficiency Neg Hx   . Urticaria Neg Hx     Past Surgical History:  Procedure Laterality Date  . AV FISTULA PLACEMENT Left 07/02/2018   Procedure: Creation of Left arm BRACHIOBASILIC ARTERIOVENOUS  FISTULA;  Surgeon: Marty Heck, MD;  Location: [facility];  Service: Vascular;  Laterality: Left;  . BASCILIC VEIN TRANSPOSITION Left 08/27/2018   Procedure: Left arm BRACHIOBASILIC VEIN TRANSPOSITION SECOND STAGE;  Surgeon: Marty Heck, MD;  Location: [facility];  Service: Vascular;  Laterality: Left;  . CHOLECYSTECTOMY    . COLONOSCOPY W/ BIOPSIES AND POLYPECTOMY    . GIVENS CAPSULE STUDY N/A 03/01/2018   Procedure: GIVENS CAPSULE STUDY;  Surgeon: Jerene Bears, MD;  Location: [facility];  Service: Gastroenterology;  Laterality: N/A;  . HERNIA REPAIR     As baby  . MULTIPLE TOOTH EXTRACTIONS    . NOSE SURGERY    . RENAL BIOPSY     Social History   Occupational History  . Not on file  Tobacco Use  . Smoking status: Former Smoker    Types: Cigarettes    Quit date: 2000    Years since quitting: 21.5  . Smokeless tobacco: Never Used  Vaping Use  . Vaping Use: Never used  Substance and Sexual Activity  . Alcohol use: Not Currently    Alcohol/week: 2.0 standard drinks    Types: 2 Shots of liquor per week    Comment: occ  . Drug use: No  . Sexual activity: Never    Partners: Male    Birth control/protection: Condom    Comment: declined condoms

## 2020-05-22 DIAGNOSIS — D509 Iron deficiency anemia, unspecified: Secondary | ICD-10-CM | POA: Diagnosis not present

## 2020-05-22 DIAGNOSIS — N2581 Secondary hyperparathyroidism of renal origin: Secondary | ICD-10-CM | POA: Diagnosis not present

## 2020-05-22 DIAGNOSIS — E7849 Other hyperlipidemia: Secondary | ICD-10-CM | POA: Diagnosis not present

## 2020-05-22 DIAGNOSIS — N2589 Other disorders resulting from impaired renal tubular function: Secondary | ICD-10-CM | POA: Diagnosis not present

## 2020-05-22 DIAGNOSIS — E440 Moderate protein-calorie malnutrition: Secondary | ICD-10-CM | POA: Diagnosis not present

## 2020-05-22 DIAGNOSIS — Z79899 Other long term (current) drug therapy: Secondary | ICD-10-CM | POA: Diagnosis not present

## 2020-05-22 DIAGNOSIS — Z992 Dependence on renal dialysis: Secondary | ICD-10-CM | POA: Diagnosis not present

## 2020-05-22 DIAGNOSIS — N186 End stage renal disease: Secondary | ICD-10-CM | POA: Diagnosis not present

## 2020-05-22 DIAGNOSIS — R82998 Other abnormal findings in urine: Secondary | ICD-10-CM | POA: Diagnosis not present

## 2020-05-24 ENCOUNTER — Ambulatory Visit (INDEPENDENT_AMBULATORY_CARE_PROVIDER_SITE_OTHER): Payer: Medicare Other | Admitting: Allergy & Immunology

## 2020-05-24 ENCOUNTER — Other Ambulatory Visit: Payer: Self-pay

## 2020-05-24 ENCOUNTER — Encounter: Payer: Self-pay | Admitting: Allergy & Immunology

## 2020-05-24 VITALS — BP 136/82 | HR 81 | Resp 18 | Ht 70.0 in

## 2020-05-24 DIAGNOSIS — K117 Disturbances of salivary secretion: Secondary | ICD-10-CM

## 2020-05-24 DIAGNOSIS — R82998 Other abnormal findings in urine: Secondary | ICD-10-CM | POA: Diagnosis not present

## 2020-05-24 DIAGNOSIS — N2589 Other disorders resulting from impaired renal tubular function: Secondary | ICD-10-CM | POA: Diagnosis not present

## 2020-05-24 DIAGNOSIS — D509 Iron deficiency anemia, unspecified: Secondary | ICD-10-CM | POA: Diagnosis not present

## 2020-05-24 DIAGNOSIS — E7849 Other hyperlipidemia: Secondary | ICD-10-CM | POA: Diagnosis not present

## 2020-05-24 DIAGNOSIS — N186 End stage renal disease: Secondary | ICD-10-CM | POA: Diagnosis not present

## 2020-05-24 DIAGNOSIS — Z992 Dependence on renal dialysis: Secondary | ICD-10-CM | POA: Diagnosis not present

## 2020-05-24 DIAGNOSIS — H1013 Acute atopic conjunctivitis, bilateral: Secondary | ICD-10-CM | POA: Diagnosis not present

## 2020-05-24 DIAGNOSIS — N2581 Secondary hyperparathyroidism of renal origin: Secondary | ICD-10-CM | POA: Diagnosis not present

## 2020-05-24 DIAGNOSIS — Z79899 Other long term (current) drug therapy: Secondary | ICD-10-CM | POA: Diagnosis not present

## 2020-05-24 DIAGNOSIS — E440 Moderate protein-calorie malnutrition: Secondary | ICD-10-CM | POA: Diagnosis not present

## 2020-05-24 DIAGNOSIS — J4530 Mild persistent asthma, uncomplicated: Secondary | ICD-10-CM

## 2020-05-24 MED ORDER — AZELASTINE HCL 0.1 % NA SOLN: 2.0000 | Freq: Two times a day (BID) | NASAL | 5 refills | Status: DC | PRN

## 2020-05-24 MED ORDER — IPRATROPIUM BROMIDE 0.06 % NA SOLN: NASAL | 5 refills | Status: DC

## 2020-05-24 MED ORDER — ALBUTEROL SULFATE HFA 108 (90 BASE) MCG/ACT IN AERS: 2.0000 | INHALATION_SPRAY | RESPIRATORY_TRACT | 2 refills | Status: DC | PRN

## 2020-05-24 MED ORDER — FLOVENT HFA 110 MCG/ACT IN AERO: 2.0000 | INHALATION_SPRAY | Freq: Two times a day (BID) | RESPIRATORY_TRACT | 5 refills | Status: DC

## 2020-05-24 MED ORDER — PAZEO 0.7 % OP SOLN: 1.0000 [drp] | Freq: Every day | OPHTHALMIC | 5 refills | Status: DC

## 2020-05-24 NOTE — Progress Notes (Signed)
FOLLOW UP  Date of Service/Encounter:  05/24/20   Assessment:   Mild persistent asthma, uncomplicated  Allergic conjunctivitis of both eyes  Xerostomia  HIV - well controlled with undetectable HIV levels and normal T cell counts  OSA - followed by Dr. Ander Slade with Pulmonology  Plan/Recommendations:   1. Mild persistent asthma, uncomplicated - Lung testing looks great today. - Try taking the Flovent (orange inhaler) two puffs once daily in the morning to see whether this helps with your symptoms of coughing with laughter.  - Spacer use reviewed. - Daily controller medication(s): Flovent 131mg 2 puffs once daily with spacer in he morning - Prior to physical activity: albuterol 2 puffs 10-15 minutes before physical activity. - Rescue medications: albuterol 4 puffs every 4-6 hours as needed - Changes during respiratory infections or worsening symptoms: Increase Flovent 1158m to 2 puffs twice daily for TWO WEEKS. - Asthma control goals:  * Full participation in all desired activities (may need albuterol before activity) * Albuterol use two time or less a week on average (not counting use with activity) * Cough interfering with sleep two time or less a month * Oral steroids no more than once a year * No hospitalizations  2. Allergic conjunctivitis of both eyes - Continue with Pataday one drop per eye twice daily. - Add on Systane eye drops as needed. - We are going to get some lab work to look for Sjogren's syndrome. - We will contact you in 1-2 weeks with the results of the testing.   3. Sore in left nostril - Add on Ayr nasal saline gel 2-3 times per day. - Call usKoreaf it is getting worse and we can add on bactroban (antibiotic ointment).   4. Return in about 6 months (around 11/24/2020). This can be an in-person, a virtual Webex or a telephone follow up visit.  Subjective:   Albert Eaton is a 89.o. male presenting today for follow up of  Chief Complaint    Patient presents with  . Asthma    difficult breathing  . Nose Problem    sore on right side.  . Dry Eye    Both eyes     Albert Eaton has a history of the following: Patient Active Problem List   Diagnosis Date Noted  . Pulsatile tinnitus of right ear 11/21/2019  . Kidney failure 11/21/2019  . CHF (congestive heart failure) (HCClackamas01/25/2021  . Chills (without fever) 11/07/2019  . Hypercalcemia 10/17/2019  . Dependence on renal dialysis (HCBall08/07/2019  . Coagulation defect, unspecified (HCNolan07/27/2020  . Iron deficiency anemia, unspecified 05/16/2019  . Anemia in other chronic diseases classified elsewhere 05/13/2019  . Arteriovenous fistula, acquired (HCCumming07/17/2020  . Body mass index (BMI) 28.0-28.9, adult 05/13/2019  . Crohn's disease of small intestine without complications (HCLind0720/07/711. Encounter for screening for respiratory tuberculosis 05/13/2019  . Gout, unspecified 05/13/2019  . Nausea 05/13/2019  . Other fatigue 05/13/2019  . Secondary hyperparathyroidism of renal origin (HCNeosho07/17/2020  . End stage renal disease (HCVandalia07/17/2020  . Chronic diastolic (congestive) heart failure (HCFrackville07/17/2020  . Chronic kidney disease, unspecified 05/13/2019  . Hypertensive chronic kidney disease with stage 1 through stage 4 chronic kidney disease, or unspecified chronic kidney disease 05/13/2019  . Obstructive sleep apnea 05/13/2019  . Discomfort of right ear 02/18/2019  . Chronic kidney disease (CKD), stage IV (severe) (HCCarlsbad08/20/2019  . Hypokalemia 06/13/2018  . Abnormal CT scan, small bowel   .  Perennial allergic rhinitis with a predominant nonallergic component 02/09/2018  . Allergic conjunctivitis 02/09/2018  . Mild intermittent asthma 02/09/2018  . Atherosclerosis 12/09/2017  . Hypothyroidism 11/19/2017  . Mixed hyperlipidemia 10/13/2017  . Personal history of gout 10/13/2017  . S/P cholecystectomy 10/13/2017  . Congestive heart failure with LV  diastolic dysfunction, NYHA class 1 ([place]) 10/13/2017  . Hypothyroidism (acquired) 10/13/2017  . 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 SOB (shortness of breath) 06/07/2017  . Arthritis, gouty 01/27/2017  . Chronic diastolic CHF (congestive heart failure) ([place]) 01/06/2017  . OSA (obstructive sleep apnea) 01/06/2017  . Abnormal electrocardiogram (ECG) (EKG) 10/31/2016  . Chest pain 10/09/2016  . HIV disease ([place]) 10/09/2016  . Hypertensive heart disease with CHF (congestive heart failure) ([place]) 10/09/2016  . Chronic kidney disease, stage 4 (severe) ([place]) 05/14/2016  . Left-sided low back pain without sciatica 05/29/2015  . NYHA class 2 heart failure with preserved ejection fraction ([place]) 05/15/2015  . PAH (pulmonary artery hypertension) ([place]) 05/08/2015  . Hypertension 05/04/2015  . Non-rheumatic mitral regurgitation 05/04/2015  . Prolonged Q-T interval on ECG 05/04/2015  . Human immunodeficiency virus (HIV) disease ([place]) 05/04/2015    History obtained from: chart review and patient.  Albert Eaton is a 54 y.o. male presenting for a follow up visit. He was last seen in January 2021. He was previously followed by Dr. Verlin Fester. At that time,his asthma was under good control with albuterol as needed with Flovent added during respiratory flares. His P/SAR was controlled with fluticasone as needed with azelastine as needed. For his allergic conjunctivitis, we continue with Pataday as needed.   Since the last visit, he has been only OK.   Asthma/Respiratory Symptom History: He tells me that when he goes to laugh, he has problems when he will start coughing. He reports that he has to stop laughing to make the cough stop. This started around 2 months ago. He does use albuterol with improvement in the symptoms. He has not tried using fluticasone to see if this provides any relief.   Allergic Rhinitis Symptom History: His last testing was positive only to dust mite (2+) on intradermal testing in May  2019. He reports that he is having dry eyes. This started when he transitioned to a home dialysis unity. He has been using Pataday eye drops without improvement. He was not having this before he started getting dialysis at home. He reports that he started the home dialysis around June 7th. He does get regular labs monthly which have been all stable. He reports that "everything looks good" with regards to the labs. He also reports dry mouth. He has never been diagnosed with Sjogren's syndrome.   He does report a sore in the left side of his nose. This started the week before and it is slowly getting better.   His sister has MS and lupus. His brother has sickle cell anemia. Xavious himself has sickle cell trait. He is HIV positive and is followed by Dr. Johnnye Sima.   Otherwise, there have been no changes to his past medical history, surgical history, family history, or social history.    Review of Systems  Constitutional: Negative.  Negative for fever, malaise/fatigue and weight loss.  HENT: Negative.  Negative for congestion, ear discharge and ear pain.   Eyes: Positive for redness. Negative for pain and discharge.       Positive for dry eyes.  Respiratory: Negative for cough, sputum production, shortness of breath and wheezing.   Cardiovascular: Negative.  Negative for chest pain and palpitations.  Gastrointestinal: Negative for abdominal pain, constipation, diarrhea, heartburn, nausea and vomiting.  Skin: Negative.  Negative for itching and rash.  Neurological: Negative for dizziness and headaches.  Endo/Heme/Allergies: Negative for environmental allergies. Does not bruise/bleed easily.       Objective:   Blood pressure (!) 136/82, pulse 81, resp. rate 18, height 5' 10"  (1.778 m), SpO2 96 %. Body mass index is 29.56 kg/m.   Physical Exam:  Physical Exam Constitutional:      Appearance: He is well-developed.  HENT:     Head: Normocephalic and atraumatic.     Right Ear: Tympanic  membrane, ear canal and external ear normal.     Left Ear: Tympanic membrane and ear canal normal.     Nose: No nasal deformity, septal deviation, mucosal edema or rhinorrhea.     Right Sinus: No maxillary sinus tenderness or frontal sinus tenderness.     Left Sinus: No maxillary sinus tenderness or frontal sinus tenderness.     Comments: There is dried rhinorrhea in the bilateral nares. No epistaxis that I could appreciate.    Mouth/Throat:     Mouth: Mucous membranes are not pale and not dry.     Pharynx: Uvula midline.  Eyes:     General:        Right eye: No discharge.        Left eye: No discharge.     Conjunctiva/sclera: Conjunctivae normal.     Right eye: Right conjunctiva is not injected. No chemosis.    Left eye: Left conjunctiva is not injected. No chemosis.    Pupils: Pupils are equal, round, and reactive to light.  Cardiovascular:     Rate and Rhythm: Normal rate and regular rhythm.     Heart sounds: Normal heart sounds.  Pulmonary:     Effort: Pulmonary effort is normal. No tachypnea, accessory muscle usage or respiratory distress.     Breath sounds: Normal breath sounds. No wheezing, rhonchi or rales.     Comments: Moving air well in all lung fields.  Chest:     Chest wall: No tenderness.  Lymphadenopathy:     Cervical: No cervical adenopathy.  Skin:    Coloration: Skin is not pale.     Findings: No abrasion, erythema, petechiae or rash. Rash is not papular, urticarial or vesicular.  Neurological:     Mental Status: He is alert.      Diagnostic studies:    Spirometry: results normal (FEV1: 3.21/98%, FVC: 4.09/99%, FEV1/FVC: 78%).    Spirometry consistent with normal pattern.   Allergy Studies: none       Salvatore Marvel, MD  [facility]

## 2020-05-24 NOTE — Patient Instructions (Addendum)
1. Mild persistent asthma, uncomplicated - Lung testing looks great today. - Try taking the Flovent (orange inhaler) two puffs once daily in the morning to see whether this helps with your symptoms of coughing with laughter.  - Spacer use reviewed. - Daily controller medication(s): Flovent 13mg 2 puffs once daily with spacer in he morning - Prior to physical activity: albuterol 2 puffs 10-15 minutes before physical activity. - Rescue medications: albuterol 4 puffs every 4-6 hours as needed - Changes during respiratory infections or worsening symptoms: Increase Flovent 1160m to 2 puffs twice daily for TWO WEEKS. - Asthma control goals:  * Full participation in all desired activities (may need albuterol before activity) * Albuterol use two time or less a week on average (not counting use with activity) * Cough interfering with sleep two time or less a month * Oral steroids no more than once a year * No hospitalizations  2. Allergic conjunctivitis of both eyes - Continue with Pataday one drop per eye twice daily. - Add on Systane eye drops as needed. - We are going to get some lab work to look for Sjogren's syndrome. - We will contact you in 1-2 weeks with the results of the testing.   3. Sore in left nostril - Add on Ayr nasal saline gel 2-3 times per day. - Call usKoreaf it is getting worse and we can add on bactroban (antibiotic ointment).   4. Return in about 6 months (around 11/24/2020). This can be an in-person, a virtual Webex or a telephone follow up visit.   Please inform usKoreaf any Emergency Department visits, hospitalizations, or changes in symptoms. Call usKoreaefore going to the ED for breathing or allergy symptoms since we might be able to fit you in for a sick visit. Feel free to contact usKoreanytime with any questions, problems, or concerns.  It was a pleasure to meet you today!  Websites that have reliable patient information: 1. American Academy of Asthma, Allergy, and  Immunology: www.aaaai.org 2. Food Allergy Research and Education (FARE): foodallergy.org 3. Mothers of Asthmatics: http://www.asthmacommunitynetwork.org 4. American College of Allergy, Asthma, and Immunology: www.acaai.org   COVID-19 Vaccine Information can be found at: [email or web address] questions related to vaccine distribution or appointments, please email vaccine@ [email or web address] or call [phone]    "Like" usKorean Facebook and Instagram for our latest updates!        Make sure you are registered to vote! If you have moved or changed any of your contact information, you will need to get this updated before voting!  In some cases, you MAY be able to register to vote online: [email or web address]

## 2020-05-25 ENCOUNTER — Other Ambulatory Visit: Payer: Self-pay

## 2020-05-25 DIAGNOSIS — Z79899 Other long term (current) drug therapy: Secondary | ICD-10-CM | POA: Diagnosis not present

## 2020-05-25 DIAGNOSIS — E440 Moderate protein-calorie malnutrition: Secondary | ICD-10-CM | POA: Diagnosis not present

## 2020-05-25 DIAGNOSIS — D509 Iron deficiency anemia, unspecified: Secondary | ICD-10-CM | POA: Diagnosis not present

## 2020-05-25 DIAGNOSIS — N2589 Other disorders resulting from impaired renal tubular function: Secondary | ICD-10-CM | POA: Diagnosis not present

## 2020-05-25 DIAGNOSIS — E7849 Other hyperlipidemia: Secondary | ICD-10-CM | POA: Diagnosis not present

## 2020-05-25 DIAGNOSIS — Z992 Dependence on renal dialysis: Secondary | ICD-10-CM | POA: Diagnosis not present

## 2020-05-25 DIAGNOSIS — N186 End stage renal disease: Secondary | ICD-10-CM | POA: Diagnosis not present

## 2020-05-25 DIAGNOSIS — M109 Gout, unspecified: Secondary | ICD-10-CM

## 2020-05-25 DIAGNOSIS — R82998 Other abnormal findings in urine: Secondary | ICD-10-CM | POA: Diagnosis not present

## 2020-05-25 DIAGNOSIS — N2581 Secondary hyperparathyroidism of renal origin: Secondary | ICD-10-CM | POA: Diagnosis not present

## 2020-05-25 LAB — SEDIMENTATION RATE: Sed Rate: 68 mm/hr — ABNORMAL HIGH (ref 0–30)

## 2020-05-25 LAB — C-REACTIVE PROTEIN: CRP: 4 mg/L (ref 0–10)

## 2020-05-25 MED ORDER — ALLOPURINOL 100 MG PO TABS: 100.0000 mg | ORAL_TABLET | Freq: Two times a day (BID) | ORAL | 3 refills | Status: DC

## 2020-05-26 DIAGNOSIS — Z992 Dependence on renal dialysis: Secondary | ICD-10-CM | POA: Diagnosis not present

## 2020-05-26 DIAGNOSIS — N186 End stage renal disease: Secondary | ICD-10-CM | POA: Diagnosis not present

## 2020-05-26 DIAGNOSIS — I129 Hypertensive chronic kidney disease with stage 1 through stage 4 chronic kidney disease, or unspecified chronic kidney disease: Secondary | ICD-10-CM | POA: Diagnosis not present

## 2020-05-27 ENCOUNTER — Encounter: Payer: Self-pay | Admitting: Allergy & Immunology

## 2020-05-28 ENCOUNTER — Other Ambulatory Visit: Payer: Self-pay | Admitting: Internal Medicine

## 2020-05-28 DIAGNOSIS — R82998 Other abnormal findings in urine: Secondary | ICD-10-CM | POA: Diagnosis not present

## 2020-05-28 DIAGNOSIS — E440 Moderate protein-calorie malnutrition: Secondary | ICD-10-CM | POA: Diagnosis not present

## 2020-05-28 DIAGNOSIS — E8770 Fluid overload, unspecified: Secondary | ICD-10-CM | POA: Diagnosis not present

## 2020-05-28 DIAGNOSIS — N186 End stage renal disease: Secondary | ICD-10-CM | POA: Diagnosis not present

## 2020-05-28 DIAGNOSIS — Z4931 Encounter for adequacy testing for hemodialysis: Secondary | ICD-10-CM | POA: Diagnosis not present

## 2020-05-28 DIAGNOSIS — Z79899 Other long term (current) drug therapy: Secondary | ICD-10-CM | POA: Diagnosis not present

## 2020-05-28 DIAGNOSIS — E876 Hypokalemia: Secondary | ICD-10-CM | POA: Diagnosis not present

## 2020-05-28 DIAGNOSIS — K7689 Other specified diseases of liver: Secondary | ICD-10-CM | POA: Diagnosis not present

## 2020-05-28 DIAGNOSIS — N2581 Secondary hyperparathyroidism of renal origin: Secondary | ICD-10-CM | POA: Diagnosis not present

## 2020-05-28 DIAGNOSIS — D509 Iron deficiency anemia, unspecified: Secondary | ICD-10-CM | POA: Diagnosis not present

## 2020-05-28 DIAGNOSIS — Z992 Dependence on renal dialysis: Secondary | ICD-10-CM | POA: Diagnosis not present

## 2020-05-29 DIAGNOSIS — E440 Moderate protein-calorie malnutrition: Secondary | ICD-10-CM | POA: Diagnosis not present

## 2020-05-29 DIAGNOSIS — E8770 Fluid overload, unspecified: Secondary | ICD-10-CM | POA: Diagnosis not present

## 2020-05-29 DIAGNOSIS — D509 Iron deficiency anemia, unspecified: Secondary | ICD-10-CM | POA: Diagnosis not present

## 2020-05-29 DIAGNOSIS — N186 End stage renal disease: Secondary | ICD-10-CM | POA: Diagnosis not present

## 2020-05-29 DIAGNOSIS — K7689 Other specified diseases of liver: Secondary | ICD-10-CM | POA: Diagnosis not present

## 2020-05-29 DIAGNOSIS — Z4931 Encounter for adequacy testing for hemodialysis: Secondary | ICD-10-CM | POA: Diagnosis not present

## 2020-05-29 DIAGNOSIS — N2581 Secondary hyperparathyroidism of renal origin: Secondary | ICD-10-CM | POA: Diagnosis not present

## 2020-05-29 DIAGNOSIS — Z992 Dependence on renal dialysis: Secondary | ICD-10-CM | POA: Diagnosis not present

## 2020-05-29 DIAGNOSIS — R82998 Other abnormal findings in urine: Secondary | ICD-10-CM | POA: Diagnosis not present

## 2020-05-29 DIAGNOSIS — Z79899 Other long term (current) drug therapy: Secondary | ICD-10-CM | POA: Diagnosis not present

## 2020-05-29 DIAGNOSIS — E876 Hypokalemia: Secondary | ICD-10-CM | POA: Diagnosis not present

## 2020-05-31 DIAGNOSIS — N186 End stage renal disease: Secondary | ICD-10-CM | POA: Diagnosis not present

## 2020-05-31 DIAGNOSIS — Z79899 Other long term (current) drug therapy: Secondary | ICD-10-CM | POA: Diagnosis not present

## 2020-05-31 DIAGNOSIS — N2581 Secondary hyperparathyroidism of renal origin: Secondary | ICD-10-CM | POA: Diagnosis not present

## 2020-05-31 DIAGNOSIS — E8770 Fluid overload, unspecified: Secondary | ICD-10-CM | POA: Diagnosis not present

## 2020-05-31 DIAGNOSIS — E876 Hypokalemia: Secondary | ICD-10-CM | POA: Diagnosis not present

## 2020-05-31 DIAGNOSIS — Z992 Dependence on renal dialysis: Secondary | ICD-10-CM | POA: Diagnosis not present

## 2020-05-31 DIAGNOSIS — K7689 Other specified diseases of liver: Secondary | ICD-10-CM | POA: Diagnosis not present

## 2020-05-31 DIAGNOSIS — D509 Iron deficiency anemia, unspecified: Secondary | ICD-10-CM | POA: Diagnosis not present

## 2020-05-31 DIAGNOSIS — Z4931 Encounter for adequacy testing for hemodialysis: Secondary | ICD-10-CM | POA: Diagnosis not present

## 2020-05-31 DIAGNOSIS — R82998 Other abnormal findings in urine: Secondary | ICD-10-CM | POA: Diagnosis not present

## 2020-05-31 DIAGNOSIS — E440 Moderate protein-calorie malnutrition: Secondary | ICD-10-CM | POA: Diagnosis not present

## 2020-06-01 DIAGNOSIS — K7689 Other specified diseases of liver: Secondary | ICD-10-CM | POA: Diagnosis not present

## 2020-06-01 DIAGNOSIS — R82998 Other abnormal findings in urine: Secondary | ICD-10-CM | POA: Diagnosis not present

## 2020-06-01 DIAGNOSIS — Z4931 Encounter for adequacy testing for hemodialysis: Secondary | ICD-10-CM | POA: Diagnosis not present

## 2020-06-01 DIAGNOSIS — D509 Iron deficiency anemia, unspecified: Secondary | ICD-10-CM | POA: Diagnosis not present

## 2020-06-01 DIAGNOSIS — E8770 Fluid overload, unspecified: Secondary | ICD-10-CM | POA: Diagnosis not present

## 2020-06-01 DIAGNOSIS — N2581 Secondary hyperparathyroidism of renal origin: Secondary | ICD-10-CM | POA: Diagnosis not present

## 2020-06-01 DIAGNOSIS — N186 End stage renal disease: Secondary | ICD-10-CM | POA: Diagnosis not present

## 2020-06-01 DIAGNOSIS — Z992 Dependence on renal dialysis: Secondary | ICD-10-CM | POA: Diagnosis not present

## 2020-06-01 DIAGNOSIS — Z79899 Other long term (current) drug therapy: Secondary | ICD-10-CM | POA: Diagnosis not present

## 2020-06-01 DIAGNOSIS — E876 Hypokalemia: Secondary | ICD-10-CM | POA: Diagnosis not present

## 2020-06-01 DIAGNOSIS — E440 Moderate protein-calorie malnutrition: Secondary | ICD-10-CM | POA: Diagnosis not present

## 2020-06-03 ENCOUNTER — Other Ambulatory Visit: Payer: Self-pay | Admitting: Infectious Diseases

## 2020-06-03 DIAGNOSIS — B20 Human immunodeficiency virus [HIV] disease: Secondary | ICD-10-CM

## 2020-06-04 ENCOUNTER — Other Ambulatory Visit: Payer: Self-pay | Admitting: *Deleted

## 2020-06-04 DIAGNOSIS — N186 End stage renal disease: Secondary | ICD-10-CM | POA: Diagnosis not present

## 2020-06-04 DIAGNOSIS — Z79899 Other long term (current) drug therapy: Secondary | ICD-10-CM | POA: Diagnosis not present

## 2020-06-04 DIAGNOSIS — E440 Moderate protein-calorie malnutrition: Secondary | ICD-10-CM | POA: Diagnosis not present

## 2020-06-04 DIAGNOSIS — B20 Human immunodeficiency virus [HIV] disease: Secondary | ICD-10-CM

## 2020-06-04 DIAGNOSIS — R82998 Other abnormal findings in urine: Secondary | ICD-10-CM | POA: Diagnosis not present

## 2020-06-04 DIAGNOSIS — E8770 Fluid overload, unspecified: Secondary | ICD-10-CM | POA: Diagnosis not present

## 2020-06-04 DIAGNOSIS — E876 Hypokalemia: Secondary | ICD-10-CM | POA: Diagnosis not present

## 2020-06-04 DIAGNOSIS — D509 Iron deficiency anemia, unspecified: Secondary | ICD-10-CM | POA: Diagnosis not present

## 2020-06-04 DIAGNOSIS — K7689 Other specified diseases of liver: Secondary | ICD-10-CM | POA: Diagnosis not present

## 2020-06-04 DIAGNOSIS — Z4931 Encounter for adequacy testing for hemodialysis: Secondary | ICD-10-CM | POA: Diagnosis not present

## 2020-06-04 DIAGNOSIS — Z992 Dependence on renal dialysis: Secondary | ICD-10-CM | POA: Diagnosis not present

## 2020-06-04 DIAGNOSIS — N2581 Secondary hyperparathyroidism of renal origin: Secondary | ICD-10-CM | POA: Diagnosis not present

## 2020-06-04 MED ORDER — ABACAVIR SULFATE 300 MG PO TABS: 300.0000 mg | ORAL_TABLET | Freq: Every day | ORAL | 3 refills | Status: DC

## 2020-06-04 MED ORDER — ISENTRESS 400 MG PO TABS: 400.0000 mg | ORAL_TABLET | Freq: Two times a day (BID) | ORAL | 3 refills | Status: DC

## 2020-06-04 MED ORDER — LAMIVUDINE 100 MG PO TABS: ORAL_TABLET | ORAL | 3 refills | Status: DC

## 2020-06-05 DIAGNOSIS — Z992 Dependence on renal dialysis: Secondary | ICD-10-CM | POA: Diagnosis not present

## 2020-06-05 DIAGNOSIS — N186 End stage renal disease: Secondary | ICD-10-CM | POA: Diagnosis not present

## 2020-06-05 DIAGNOSIS — N2581 Secondary hyperparathyroidism of renal origin: Secondary | ICD-10-CM | POA: Diagnosis not present

## 2020-06-05 DIAGNOSIS — Z4931 Encounter for adequacy testing for hemodialysis: Secondary | ICD-10-CM | POA: Diagnosis not present

## 2020-06-05 DIAGNOSIS — R82998 Other abnormal findings in urine: Secondary | ICD-10-CM | POA: Diagnosis not present

## 2020-06-05 DIAGNOSIS — E8770 Fluid overload, unspecified: Secondary | ICD-10-CM | POA: Diagnosis not present

## 2020-06-05 DIAGNOSIS — E876 Hypokalemia: Secondary | ICD-10-CM | POA: Diagnosis not present

## 2020-06-05 DIAGNOSIS — K7689 Other specified diseases of liver: Secondary | ICD-10-CM | POA: Diagnosis not present

## 2020-06-05 DIAGNOSIS — D509 Iron deficiency anemia, unspecified: Secondary | ICD-10-CM | POA: Diagnosis not present

## 2020-06-05 DIAGNOSIS — Z79899 Other long term (current) drug therapy: Secondary | ICD-10-CM | POA: Diagnosis not present

## 2020-06-05 DIAGNOSIS — E440 Moderate protein-calorie malnutrition: Secondary | ICD-10-CM | POA: Diagnosis not present

## 2020-06-07 DIAGNOSIS — E876 Hypokalemia: Secondary | ICD-10-CM | POA: Diagnosis not present

## 2020-06-07 DIAGNOSIS — Z79899 Other long term (current) drug therapy: Secondary | ICD-10-CM | POA: Diagnosis not present

## 2020-06-07 DIAGNOSIS — R82998 Other abnormal findings in urine: Secondary | ICD-10-CM | POA: Diagnosis not present

## 2020-06-07 DIAGNOSIS — D509 Iron deficiency anemia, unspecified: Secondary | ICD-10-CM | POA: Diagnosis not present

## 2020-06-07 DIAGNOSIS — Z4931 Encounter for adequacy testing for hemodialysis: Secondary | ICD-10-CM | POA: Diagnosis not present

## 2020-06-07 DIAGNOSIS — N186 End stage renal disease: Secondary | ICD-10-CM | POA: Diagnosis not present

## 2020-06-07 DIAGNOSIS — Z992 Dependence on renal dialysis: Secondary | ICD-10-CM | POA: Diagnosis not present

## 2020-06-07 DIAGNOSIS — E440 Moderate protein-calorie malnutrition: Secondary | ICD-10-CM | POA: Diagnosis not present

## 2020-06-07 DIAGNOSIS — N2581 Secondary hyperparathyroidism of renal origin: Secondary | ICD-10-CM | POA: Diagnosis not present

## 2020-06-07 DIAGNOSIS — E8770 Fluid overload, unspecified: Secondary | ICD-10-CM | POA: Diagnosis not present

## 2020-06-07 DIAGNOSIS — K7689 Other specified diseases of liver: Secondary | ICD-10-CM | POA: Diagnosis not present

## 2020-06-08 DIAGNOSIS — K7689 Other specified diseases of liver: Secondary | ICD-10-CM | POA: Diagnosis not present

## 2020-06-08 DIAGNOSIS — D509 Iron deficiency anemia, unspecified: Secondary | ICD-10-CM | POA: Diagnosis not present

## 2020-06-08 DIAGNOSIS — N186 End stage renal disease: Secondary | ICD-10-CM | POA: Diagnosis not present

## 2020-06-08 DIAGNOSIS — E876 Hypokalemia: Secondary | ICD-10-CM | POA: Diagnosis not present

## 2020-06-08 DIAGNOSIS — E8770 Fluid overload, unspecified: Secondary | ICD-10-CM | POA: Diagnosis not present

## 2020-06-08 DIAGNOSIS — R82998 Other abnormal findings in urine: Secondary | ICD-10-CM | POA: Diagnosis not present

## 2020-06-08 DIAGNOSIS — Z4931 Encounter for adequacy testing for hemodialysis: Secondary | ICD-10-CM | POA: Diagnosis not present

## 2020-06-08 DIAGNOSIS — E440 Moderate protein-calorie malnutrition: Secondary | ICD-10-CM | POA: Diagnosis not present

## 2020-06-08 DIAGNOSIS — Z992 Dependence on renal dialysis: Secondary | ICD-10-CM | POA: Diagnosis not present

## 2020-06-08 DIAGNOSIS — Z79899 Other long term (current) drug therapy: Secondary | ICD-10-CM | POA: Diagnosis not present

## 2020-06-08 DIAGNOSIS — N2581 Secondary hyperparathyroidism of renal origin: Secondary | ICD-10-CM | POA: Diagnosis not present

## 2020-06-11 DIAGNOSIS — E8770 Fluid overload, unspecified: Secondary | ICD-10-CM | POA: Diagnosis not present

## 2020-06-11 DIAGNOSIS — D509 Iron deficiency anemia, unspecified: Secondary | ICD-10-CM | POA: Diagnosis not present

## 2020-06-11 DIAGNOSIS — R82998 Other abnormal findings in urine: Secondary | ICD-10-CM | POA: Diagnosis not present

## 2020-06-11 DIAGNOSIS — Z4931 Encounter for adequacy testing for hemodialysis: Secondary | ICD-10-CM | POA: Diagnosis not present

## 2020-06-11 DIAGNOSIS — E440 Moderate protein-calorie malnutrition: Secondary | ICD-10-CM | POA: Diagnosis not present

## 2020-06-11 DIAGNOSIS — Z79899 Other long term (current) drug therapy: Secondary | ICD-10-CM | POA: Diagnosis not present

## 2020-06-11 DIAGNOSIS — Z992 Dependence on renal dialysis: Secondary | ICD-10-CM | POA: Diagnosis not present

## 2020-06-11 DIAGNOSIS — N186 End stage renal disease: Secondary | ICD-10-CM | POA: Diagnosis not present

## 2020-06-11 DIAGNOSIS — N2581 Secondary hyperparathyroidism of renal origin: Secondary | ICD-10-CM | POA: Diagnosis not present

## 2020-06-11 DIAGNOSIS — E876 Hypokalemia: Secondary | ICD-10-CM | POA: Diagnosis not present

## 2020-06-11 DIAGNOSIS — K7689 Other specified diseases of liver: Secondary | ICD-10-CM | POA: Diagnosis not present

## 2020-06-12 DIAGNOSIS — E440 Moderate protein-calorie malnutrition: Secondary | ICD-10-CM | POA: Diagnosis not present

## 2020-06-12 DIAGNOSIS — E876 Hypokalemia: Secondary | ICD-10-CM | POA: Diagnosis not present

## 2020-06-12 DIAGNOSIS — Z992 Dependence on renal dialysis: Secondary | ICD-10-CM | POA: Diagnosis not present

## 2020-06-12 DIAGNOSIS — E8770 Fluid overload, unspecified: Secondary | ICD-10-CM | POA: Diagnosis not present

## 2020-06-12 DIAGNOSIS — K7689 Other specified diseases of liver: Secondary | ICD-10-CM | POA: Diagnosis not present

## 2020-06-12 DIAGNOSIS — Z4931 Encounter for adequacy testing for hemodialysis: Secondary | ICD-10-CM | POA: Diagnosis not present

## 2020-06-12 DIAGNOSIS — N2581 Secondary hyperparathyroidism of renal origin: Secondary | ICD-10-CM | POA: Diagnosis not present

## 2020-06-12 DIAGNOSIS — Z79899 Other long term (current) drug therapy: Secondary | ICD-10-CM | POA: Diagnosis not present

## 2020-06-12 DIAGNOSIS — R82998 Other abnormal findings in urine: Secondary | ICD-10-CM | POA: Diagnosis not present

## 2020-06-12 DIAGNOSIS — D509 Iron deficiency anemia, unspecified: Secondary | ICD-10-CM | POA: Diagnosis not present

## 2020-06-12 DIAGNOSIS — N186 End stage renal disease: Secondary | ICD-10-CM | POA: Diagnosis not present

## 2020-06-13 NOTE — Addendum Note (Signed)
Addended by: Jamelle Haring B on: 06/13/2020 04:19 PM   Modules accepted: Orders

## 2020-06-14 DIAGNOSIS — E876 Hypokalemia: Secondary | ICD-10-CM | POA: Diagnosis not present

## 2020-06-14 DIAGNOSIS — Z992 Dependence on renal dialysis: Secondary | ICD-10-CM | POA: Diagnosis not present

## 2020-06-14 DIAGNOSIS — N186 End stage renal disease: Secondary | ICD-10-CM | POA: Diagnosis not present

## 2020-06-14 DIAGNOSIS — E440 Moderate protein-calorie malnutrition: Secondary | ICD-10-CM | POA: Diagnosis not present

## 2020-06-14 DIAGNOSIS — Z79899 Other long term (current) drug therapy: Secondary | ICD-10-CM | POA: Diagnosis not present

## 2020-06-14 DIAGNOSIS — E8770 Fluid overload, unspecified: Secondary | ICD-10-CM | POA: Diagnosis not present

## 2020-06-14 DIAGNOSIS — N2581 Secondary hyperparathyroidism of renal origin: Secondary | ICD-10-CM | POA: Diagnosis not present

## 2020-06-14 DIAGNOSIS — R82998 Other abnormal findings in urine: Secondary | ICD-10-CM | POA: Diagnosis not present

## 2020-06-14 DIAGNOSIS — D509 Iron deficiency anemia, unspecified: Secondary | ICD-10-CM | POA: Diagnosis not present

## 2020-06-14 DIAGNOSIS — Z4931 Encounter for adequacy testing for hemodialysis: Secondary | ICD-10-CM | POA: Diagnosis not present

## 2020-06-14 DIAGNOSIS — K7689 Other specified diseases of liver: Secondary | ICD-10-CM | POA: Diagnosis not present

## 2020-06-15 DIAGNOSIS — E876 Hypokalemia: Secondary | ICD-10-CM | POA: Diagnosis not present

## 2020-06-15 DIAGNOSIS — Z992 Dependence on renal dialysis: Secondary | ICD-10-CM | POA: Diagnosis not present

## 2020-06-15 DIAGNOSIS — N186 End stage renal disease: Secondary | ICD-10-CM | POA: Diagnosis not present

## 2020-06-15 DIAGNOSIS — N2581 Secondary hyperparathyroidism of renal origin: Secondary | ICD-10-CM | POA: Diagnosis not present

## 2020-06-15 DIAGNOSIS — R82998 Other abnormal findings in urine: Secondary | ICD-10-CM | POA: Diagnosis not present

## 2020-06-15 DIAGNOSIS — E440 Moderate protein-calorie malnutrition: Secondary | ICD-10-CM | POA: Diagnosis not present

## 2020-06-15 DIAGNOSIS — E8770 Fluid overload, unspecified: Secondary | ICD-10-CM | POA: Diagnosis not present

## 2020-06-15 DIAGNOSIS — K7689 Other specified diseases of liver: Secondary | ICD-10-CM | POA: Diagnosis not present

## 2020-06-15 DIAGNOSIS — Z79899 Other long term (current) drug therapy: Secondary | ICD-10-CM | POA: Diagnosis not present

## 2020-06-15 DIAGNOSIS — Z4931 Encounter for adequacy testing for hemodialysis: Secondary | ICD-10-CM | POA: Diagnosis not present

## 2020-06-15 DIAGNOSIS — D509 Iron deficiency anemia, unspecified: Secondary | ICD-10-CM | POA: Diagnosis not present

## 2020-06-18 DIAGNOSIS — E440 Moderate protein-calorie malnutrition: Secondary | ICD-10-CM | POA: Diagnosis not present

## 2020-06-18 DIAGNOSIS — E8770 Fluid overload, unspecified: Secondary | ICD-10-CM | POA: Diagnosis not present

## 2020-06-18 DIAGNOSIS — K7689 Other specified diseases of liver: Secondary | ICD-10-CM | POA: Diagnosis not present

## 2020-06-18 DIAGNOSIS — N186 End stage renal disease: Secondary | ICD-10-CM | POA: Diagnosis not present

## 2020-06-18 DIAGNOSIS — Z4931 Encounter for adequacy testing for hemodialysis: Secondary | ICD-10-CM | POA: Diagnosis not present

## 2020-06-18 DIAGNOSIS — N2581 Secondary hyperparathyroidism of renal origin: Secondary | ICD-10-CM | POA: Diagnosis not present

## 2020-06-18 DIAGNOSIS — R82998 Other abnormal findings in urine: Secondary | ICD-10-CM | POA: Diagnosis not present

## 2020-06-18 DIAGNOSIS — Z992 Dependence on renal dialysis: Secondary | ICD-10-CM | POA: Diagnosis not present

## 2020-06-18 DIAGNOSIS — D509 Iron deficiency anemia, unspecified: Secondary | ICD-10-CM | POA: Diagnosis not present

## 2020-06-18 DIAGNOSIS — Z79899 Other long term (current) drug therapy: Secondary | ICD-10-CM | POA: Diagnosis not present

## 2020-06-18 DIAGNOSIS — E876 Hypokalemia: Secondary | ICD-10-CM | POA: Diagnosis not present

## 2020-06-19 DIAGNOSIS — K7689 Other specified diseases of liver: Secondary | ICD-10-CM | POA: Diagnosis not present

## 2020-06-19 DIAGNOSIS — Z79899 Other long term (current) drug therapy: Secondary | ICD-10-CM | POA: Diagnosis not present

## 2020-06-19 DIAGNOSIS — N2581 Secondary hyperparathyroidism of renal origin: Secondary | ICD-10-CM | POA: Diagnosis not present

## 2020-06-19 DIAGNOSIS — D509 Iron deficiency anemia, unspecified: Secondary | ICD-10-CM | POA: Diagnosis not present

## 2020-06-19 DIAGNOSIS — E8770 Fluid overload, unspecified: Secondary | ICD-10-CM | POA: Diagnosis not present

## 2020-06-19 DIAGNOSIS — Z992 Dependence on renal dialysis: Secondary | ICD-10-CM | POA: Diagnosis not present

## 2020-06-19 DIAGNOSIS — Z4931 Encounter for adequacy testing for hemodialysis: Secondary | ICD-10-CM | POA: Diagnosis not present

## 2020-06-19 DIAGNOSIS — R82998 Other abnormal findings in urine: Secondary | ICD-10-CM | POA: Diagnosis not present

## 2020-06-19 DIAGNOSIS — N186 End stage renal disease: Secondary | ICD-10-CM | POA: Diagnosis not present

## 2020-06-19 DIAGNOSIS — E876 Hypokalemia: Secondary | ICD-10-CM | POA: Diagnosis not present

## 2020-06-19 DIAGNOSIS — E440 Moderate protein-calorie malnutrition: Secondary | ICD-10-CM | POA: Diagnosis not present

## 2020-06-20 DIAGNOSIS — Z992 Dependence on renal dialysis: Secondary | ICD-10-CM | POA: Diagnosis not present

## 2020-06-20 DIAGNOSIS — N186 End stage renal disease: Secondary | ICD-10-CM | POA: Diagnosis not present

## 2020-06-20 DIAGNOSIS — Z4931 Encounter for adequacy testing for hemodialysis: Secondary | ICD-10-CM | POA: Diagnosis not present

## 2020-06-20 DIAGNOSIS — N2581 Secondary hyperparathyroidism of renal origin: Secondary | ICD-10-CM | POA: Diagnosis not present

## 2020-06-20 DIAGNOSIS — E440 Moderate protein-calorie malnutrition: Secondary | ICD-10-CM | POA: Diagnosis not present

## 2020-06-20 DIAGNOSIS — R82998 Other abnormal findings in urine: Secondary | ICD-10-CM | POA: Diagnosis not present

## 2020-06-20 DIAGNOSIS — Z79899 Other long term (current) drug therapy: Secondary | ICD-10-CM | POA: Diagnosis not present

## 2020-06-20 DIAGNOSIS — E876 Hypokalemia: Secondary | ICD-10-CM | POA: Diagnosis not present

## 2020-06-20 DIAGNOSIS — D509 Iron deficiency anemia, unspecified: Secondary | ICD-10-CM | POA: Diagnosis not present

## 2020-06-20 DIAGNOSIS — K7689 Other specified diseases of liver: Secondary | ICD-10-CM | POA: Diagnosis not present

## 2020-06-20 DIAGNOSIS — E8770 Fluid overload, unspecified: Secondary | ICD-10-CM | POA: Diagnosis not present

## 2020-06-21 DIAGNOSIS — Z79899 Other long term (current) drug therapy: Secondary | ICD-10-CM | POA: Diagnosis not present

## 2020-06-21 DIAGNOSIS — D509 Iron deficiency anemia, unspecified: Secondary | ICD-10-CM | POA: Diagnosis not present

## 2020-06-21 DIAGNOSIS — N2581 Secondary hyperparathyroidism of renal origin: Secondary | ICD-10-CM | POA: Diagnosis not present

## 2020-06-21 DIAGNOSIS — E440 Moderate protein-calorie malnutrition: Secondary | ICD-10-CM | POA: Diagnosis not present

## 2020-06-21 DIAGNOSIS — E8770 Fluid overload, unspecified: Secondary | ICD-10-CM | POA: Diagnosis not present

## 2020-06-21 DIAGNOSIS — R82998 Other abnormal findings in urine: Secondary | ICD-10-CM | POA: Diagnosis not present

## 2020-06-21 DIAGNOSIS — K7689 Other specified diseases of liver: Secondary | ICD-10-CM | POA: Diagnosis not present

## 2020-06-21 DIAGNOSIS — N186 End stage renal disease: Secondary | ICD-10-CM | POA: Diagnosis not present

## 2020-06-21 DIAGNOSIS — E876 Hypokalemia: Secondary | ICD-10-CM | POA: Diagnosis not present

## 2020-06-21 DIAGNOSIS — Z992 Dependence on renal dialysis: Secondary | ICD-10-CM | POA: Diagnosis not present

## 2020-06-21 DIAGNOSIS — Z4931 Encounter for adequacy testing for hemodialysis: Secondary | ICD-10-CM | POA: Diagnosis not present

## 2020-06-22 DIAGNOSIS — D509 Iron deficiency anemia, unspecified: Secondary | ICD-10-CM | POA: Diagnosis not present

## 2020-06-22 DIAGNOSIS — N186 End stage renal disease: Secondary | ICD-10-CM | POA: Diagnosis not present

## 2020-06-22 DIAGNOSIS — E8770 Fluid overload, unspecified: Secondary | ICD-10-CM | POA: Diagnosis not present

## 2020-06-22 DIAGNOSIS — E440 Moderate protein-calorie malnutrition: Secondary | ICD-10-CM | POA: Diagnosis not present

## 2020-06-22 DIAGNOSIS — Z79899 Other long term (current) drug therapy: Secondary | ICD-10-CM | POA: Diagnosis not present

## 2020-06-22 DIAGNOSIS — Z992 Dependence on renal dialysis: Secondary | ICD-10-CM | POA: Diagnosis not present

## 2020-06-22 DIAGNOSIS — Z4931 Encounter for adequacy testing for hemodialysis: Secondary | ICD-10-CM | POA: Diagnosis not present

## 2020-06-22 DIAGNOSIS — K7689 Other specified diseases of liver: Secondary | ICD-10-CM | POA: Diagnosis not present

## 2020-06-22 DIAGNOSIS — N2581 Secondary hyperparathyroidism of renal origin: Secondary | ICD-10-CM | POA: Diagnosis not present

## 2020-06-22 DIAGNOSIS — R82998 Other abnormal findings in urine: Secondary | ICD-10-CM | POA: Diagnosis not present

## 2020-06-22 DIAGNOSIS — E876 Hypokalemia: Secondary | ICD-10-CM | POA: Diagnosis not present

## 2020-06-25 DIAGNOSIS — N2581 Secondary hyperparathyroidism of renal origin: Secondary | ICD-10-CM | POA: Diagnosis not present

## 2020-06-25 DIAGNOSIS — R82998 Other abnormal findings in urine: Secondary | ICD-10-CM | POA: Diagnosis not present

## 2020-06-25 DIAGNOSIS — K7689 Other specified diseases of liver: Secondary | ICD-10-CM | POA: Diagnosis not present

## 2020-06-25 DIAGNOSIS — N186 End stage renal disease: Secondary | ICD-10-CM | POA: Diagnosis not present

## 2020-06-25 DIAGNOSIS — E8770 Fluid overload, unspecified: Secondary | ICD-10-CM | POA: Diagnosis not present

## 2020-06-25 DIAGNOSIS — E440 Moderate protein-calorie malnutrition: Secondary | ICD-10-CM | POA: Diagnosis not present

## 2020-06-25 DIAGNOSIS — Z79899 Other long term (current) drug therapy: Secondary | ICD-10-CM | POA: Diagnosis not present

## 2020-06-25 DIAGNOSIS — Z992 Dependence on renal dialysis: Secondary | ICD-10-CM | POA: Diagnosis not present

## 2020-06-25 DIAGNOSIS — Z4931 Encounter for adequacy testing for hemodialysis: Secondary | ICD-10-CM | POA: Diagnosis not present

## 2020-06-25 DIAGNOSIS — E876 Hypokalemia: Secondary | ICD-10-CM | POA: Diagnosis not present

## 2020-06-25 DIAGNOSIS — D509 Iron deficiency anemia, unspecified: Secondary | ICD-10-CM | POA: Diagnosis not present

## 2020-06-26 ENCOUNTER — Ambulatory Visit (INDEPENDENT_AMBULATORY_CARE_PROVIDER_SITE_OTHER): Payer: Medicare Other | Admitting: Gastroenterology

## 2020-06-26 ENCOUNTER — Encounter: Payer: Self-pay | Admitting: Gastroenterology

## 2020-06-26 ENCOUNTER — Encounter: Payer: Self-pay | Admitting: Internal Medicine

## 2020-06-26 ENCOUNTER — Other Ambulatory Visit (INDEPENDENT_AMBULATORY_CARE_PROVIDER_SITE_OTHER): Payer: Medicare Other

## 2020-06-26 VITALS — BP 132/64 | HR 87 | Ht 70.0 in | Wt 202.2 lb

## 2020-06-26 DIAGNOSIS — R1030 Lower abdominal pain, unspecified: Secondary | ICD-10-CM

## 2020-06-26 DIAGNOSIS — Z992 Dependence on renal dialysis: Secondary | ICD-10-CM | POA: Diagnosis not present

## 2020-06-26 DIAGNOSIS — Z79899 Other long term (current) drug therapy: Secondary | ICD-10-CM | POA: Diagnosis not present

## 2020-06-26 DIAGNOSIS — Z4931 Encounter for adequacy testing for hemodialysis: Secondary | ICD-10-CM | POA: Diagnosis not present

## 2020-06-26 DIAGNOSIS — N2581 Secondary hyperparathyroidism of renal origin: Secondary | ICD-10-CM | POA: Diagnosis not present

## 2020-06-26 DIAGNOSIS — E8770 Fluid overload, unspecified: Secondary | ICD-10-CM | POA: Diagnosis not present

## 2020-06-26 DIAGNOSIS — E876 Hypokalemia: Secondary | ICD-10-CM | POA: Diagnosis not present

## 2020-06-26 DIAGNOSIS — D509 Iron deficiency anemia, unspecified: Secondary | ICD-10-CM | POA: Diagnosis not present

## 2020-06-26 DIAGNOSIS — E440 Moderate protein-calorie malnutrition: Secondary | ICD-10-CM | POA: Diagnosis not present

## 2020-06-26 DIAGNOSIS — I129 Hypertensive chronic kidney disease with stage 1 through stage 4 chronic kidney disease, or unspecified chronic kidney disease: Secondary | ICD-10-CM | POA: Diagnosis not present

## 2020-06-26 DIAGNOSIS — R82998 Other abnormal findings in urine: Secondary | ICD-10-CM | POA: Diagnosis not present

## 2020-06-26 DIAGNOSIS — K7689 Other specified diseases of liver: Secondary | ICD-10-CM | POA: Diagnosis not present

## 2020-06-26 DIAGNOSIS — N186 End stage renal disease: Secondary | ICD-10-CM | POA: Diagnosis not present

## 2020-06-26 LAB — BASIC METABOLIC PANEL
BUN: 16 mg/dL (ref 6–23)
CO2: 31 mEq/L (ref 19–32)
Calcium: 10.6 mg/dL — ABNORMAL HIGH (ref 8.4–10.5)
Chloride: 90 mEq/L — ABNORMAL LOW (ref 96–112)
Creatinine, Ser: 3.84 mg/dL — ABNORMAL HIGH (ref 0.40–1.50)
GFR: 19.93 mL/min — ABNORMAL LOW (ref >60.00)
Glucose, Bld: 102 mg/dL — ABNORMAL HIGH (ref 70–99)
Potassium: 3 mEq/L — ABNORMAL LOW (ref 3.5–5.1)
Sodium: 134 mEq/L — ABNORMAL LOW (ref 135–145)

## 2020-06-26 NOTE — Progress Notes (Signed)
06/26/2020 Albert Eaton [id number] [birth year]   HISTORY OF PRESENT ILLNESS: This is a 54 year old male is a patient of Dr. Vena Rua.  He has past medical history of adenomatous colon polyps, history of ileitis by imaging not seen by capsule or colonoscopy, history of GERD with esophagitis, history of end-stage kidney disease on hemodialysis awaiting kidney transplant, HIV, sleep apnea.  He was last seen in the office in 07/2019 for complaints of lower abdominal pain.  At that time his pain was thought to be due to constipation.  He was started on Amitiza 8 mcg twice daily with food.  That was then eventually increased to 24 mcg twice daily with food.  He continues on that regimen.  He says that overall that is working well for him, he is moving his bowels well.  He was not having any more abdominal pain until just about 4 weeks ago and it recurred.  He says that the same pain that he had previously.  His symptoms are in his lower abdomen, mostly central lower abdomen but radiates to both sides as well.  He denies any rectal bleeding.  Uses lactulose on occasion as a rescue if needed.  He does have Levsin at home, but has not been using that since he thought it was for nausea.    Past Medical History:  Diagnosis Date  . Anemia   . Aortic atherosclerosis ([place])   . Asthma   . Chronic diastolic CHF (congestive heart failure) ([place]) 01/06/2017   Echo 7/16 [facility] in [place], [name]) Mild AI, mild LAE, mild concentric LVH, EF 55, normal wall motion, mild to moderate MR, mild PI, RVSP 55 // Echo 10/09/16 ([facility]):  Moderate LVH, grade 2 diastolic dysfunction, mild MR, moderate LAE   . CKD (chronic kidney disease)    per pt, waiting on a kidney transplant in near future.  . Esophagitis   . Gout   . Hepatitis    Hep B  . History of nuclear stress test    a. Nuc study 7/16: no scar or ischemia, EF 42 // b. Nuc study 12/17: EF 48, ?small apical ischemia, Low Risk  . HIV (human immunodeficiency  virus infection) ([place])   . HLD (hyperlipidemia)   . Hypertension   . Hypertensive heart disease with CHF (congestive heart failure) ([place]) 10/09/2016  . Mitral regurgitation   . OSA (obstructive sleep apnea)   . Post-operative nausea and vomiting    1 time as a child  . Sleep apnea    uses c-pap  . Wears glasses   . Wears partial dentures    Past Surgical History:  Procedure Laterality Date  . AV FISTULA PLACEMENT Left 07/02/2018   Procedure: Creation of Left arm BRACHIOBASILIC ARTERIOVENOUS  FISTULA;  Surgeon: Marty Heck, MD;  Location: [place];  Service: Vascular;  Laterality: Left;  . BASCILIC VEIN TRANSPOSITION Left 08/27/2018   Procedure: Left arm BRACHIOBASILIC VEIN TRANSPOSITION SECOND STAGE;  Surgeon: Marty Heck, MD;  Location: [place];  Service: Vascular;  Laterality: Left;  . CHOLECYSTECTOMY    . COLONOSCOPY W/ BIOPSIES AND POLYPECTOMY    . GIVENS CAPSULE STUDY N/A 03/01/2018   Procedure: GIVENS CAPSULE STUDY;  Surgeon: Jerene Bears, MD;  Location: [place];  Service: Gastroenterology;  Laterality: N/A;  . HERNIA REPAIR     As baby  . MULTIPLE TOOTH EXTRACTIONS    . NOSE SURGERY    . RENAL BIOPSY      reports  that he quit smoking about 21 years ago. His smoking use included cigarettes. He has never used smokeless tobacco. He reports previous alcohol use of about 2.0 standard drinks of alcohol per week. He reports that he does not use drugs. family history includes Heart attack (age of onset: 85) in his maternal grandmother; Heart failure in his mother; Hypertension in his brother, sister, and sister; Multiple sclerosis in his sister; Scoliosis in an other family member. Allergies  Allergen Reactions  . Ace Inhibitors Cough and Other (See Comments)  . Lisinopril Cough      Outpatient Encounter Medications as of 06/26/2020  Medication Sig  . abacavir (ZIAGEN) 300 MG tablet Take 1 tablet (300 mg total) by mouth daily.  Marland Kitchen albuterol (PROAIR HFA) 108 (90  Base) MCG/ACT inhaler Inhale 2 puffs into the lungs every 4 (four) hours as needed for wheezing or shortness of breath.  . allopurinol (ZYLOPRIM) 100 MG tablet Take 1 tablet (100 mg total) by mouth 2 (two) times daily.  Marland Kitchen amLODipine (NORVASC) 10 MG tablet Take 1 tablet (10 mg total) by mouth daily.  . Ascorbic Acid (VITAMIN C WITH ROSE HIPS) 1000 MG tablet Take 1,000 mg by mouth daily.  Marland Kitchen azelastine (ASTELIN) 0.1 % nasal spray Place 2 sprays into both nostrils 2 (two) times daily as needed for rhinitis. Use in each nostril as directed  . B Complex-C-Zn-Folic Acid (DIALYVITE/ZINC) TABS TK 1 T PO QPM  . calcitRIOL (ROCALTROL) 0.5 MCG capsule Take by mouth.  . carvedilol (COREG) 25 MG tablet TAKE 1 TABLET BY MOUTH TWICE DAILY WITH FOOD  . famotidine (PEPCID) 20 MG tablet TAKE 1 TABLET(20 MG) BY MOUTH TWICE DAILY  . fluticasone (FLOVENT HFA) 110 MCG/ACT inhaler Inhale 2 puffs into the lungs 2 (two) times daily.  Marland Kitchen gabapentin (NEURONTIN) 300 MG capsule TAKE 1 CAPSULE(300 MG) BY MOUTH THREE TIMES DAILY  . hydrALAZINE (APRESOLINE) 100 MG tablet TAKE 1 TABLET BY MOUTH THREE TIMES DAILY  . hyoscyamine (LEVSIN SL) 0.125 MG SL tablet Place 1-2 tablets (0.125-0.25 mg total) under the tongue every 8 (eight) hours as needed (lower abdominal pain, spasm).  Marland Kitchen ipratropium (ATROVENT) 0.06 % nasal spray 2 sprays per nostril 2 to 3 times daily as needed  . lactulose, encephalopathy, (CHRONULAC) 10 GM/15ML SOLN SMARTSIG:60 Milliliter(s) By Mouth As Directed  . lamivudine (EPIVIR) 100 MG tablet TAKE 1 TABLET(100 MG) BY MOUTH DAILY  . levothyroxine (SYNTHROID) 25 MCG tablet Take 1 tablet by mouth daily before breakfast  . lidocaine-prilocaine (EMLA) cream APPLY SMALL AMOUNT TO ACCESS SITE (AVF) 1 TO 2 HOURS BEFORE DIALYSIS. COVER WITH OCCLUSIVE DRESSING (SARAN WRAP)  . lubiprostone (AMITIZA) 24 MCG capsule TAKE 1 CAPSULE(24 MCG) BY MOUTH TWICE DAILY WITH A MEAL  . mupirocin ointment (BACTROBAN) 2 % Apply topically.  .  Olopatadine HCl (PAZEO) 0.7 % SOLN Place 1 drop into both eyes daily.  . raltegravir (ISENTRESS) 400 MG tablet Take 1 tablet (400 mg total) by mouth 2 (two) times daily.  Marland Kitchen Spacer/Aero-Holding Chambers DEVI 1 Device by Does not apply route 2 (two) times a day.  . Turmeric 500 MG CAPS Take 500 mg by mouth daily.  . VELPHORO 500 MG chewable tablet Chew 500 mg by mouth 3 (three) times daily.  . Vitamin D, Ergocalciferol, (DRISDOL) 1.25 MG (50000 UNIT) CAPS capsule TAKE 1 CAPSULE BY MOUTH EVERY 7 DAYS  . vitamin E (VITAMIN E) 180 MG (400 UNITS) capsule Take 400 Units by mouth daily.  Marland Kitchen zinc gluconate 50 MG  tablet Take 50 mg by mouth daily.   . [DISCONTINUED] cloNIDine (CATAPRES) 0.1 MG tablet TAKE 1 TABLET(0.1 MG) BY MOUTH TWICE DAILY   No facility-administered encounter medications on file as of 06/26/2020.     REVIEW OF SYSTEMS  : All other systems reviewed and negative except where noted in the History of Present Illness.   PHYSICAL EXAM: BP 132/64   Pulse 87   Ht 5' 10"  (1.778 m)   Wt 202 lb 3.2 oz (91.7 kg)   BMI 29.01 kg/m  General: Well developed AA male in no acute distress Head: Normocephalic and atraumatic Eyes:  Sclerae anicteric, conjunctiva pink. Ears: Normal auditory acuity Lungs: Clear throughout to auscultation; no W/R/R. Heart: Regular rate and rhythm; no M/R/G. Abdomen: Soft, non-distended.  BS present.  Moderate lower abdominal TTP. Musculoskeletal: Symmetrical with no gross deformities  Skin: No lesions on visible extremities Extremities: No edema  Neurological: Alert oriented x 4, grossly non-focal Psychological:  Alert and cooperative. Normal mood and affect  ASSESSMENT AND PLAN: *54 year old male with a past medical history of adenomatous colon polyps, history of ileitis by imaging not seen by capsule or colonoscopy, history of GERD with esophagitis who is here for follow-up.   1.  Lower abdominal pain/history of ileitis by imaging --his lower abdominal pain  was previously thought most likely related to constipation and associated colonic stretch/spasm.  He has had ileitis by imaging but this was not seen when the ileum was intubated at colonoscopy nor by video capsule endoscopy.  He was treated for constipation, currently on amitiza 24 mcg BID, and says that he is moving his bowels well.  Pain went away until just about a month ago.   --Will get CT scan of the abdomen and pelvis with contrast. --Levsin sublingual 0.125 mg 1 to 2 tablets every 8 hours as needed for lower abdominal crampy pain.  He already has this at home and will try it in the interim.   2.  History of adenomatous polyps --surveillance colonoscopy recommended February 2022     CC:  [name], FNP

## 2020-06-26 NOTE — Patient Instructions (Signed)
If you are age 54 or older, your body mass index should be between 23-30. Your Body mass index is 29.01 kg/m. If this is out of the aforementioned range listed, please consider follow up with your Primary Care Provider.  If you are age 66 or younger, your body mass index should be between 19-25. Your Body mass index is 29.01 kg/m. If this is out of the aformentioned range listed, please consider follow up with your Primary Care Provider.   You have been scheduled for a CT scan of the abdomen and pelvis at [facility], 1st floor Radiology. You are scheduled on 07/11/20  at 3:30 pm. You should arrive 15 minutes prior to your appointment time for registration. Please follow the written instructions below on the day of your exam:    1) Do not eat or drink anything after 11:30 am (4 hours prior to your test)  2) You will need to go to [facility]iology, at least 3 days prior to your procedure you will need to pick up 2 bottles of oral contrast to drink.  The solution may taste better if refrigerated, but do NOT add ice or any other liquid to this solution. Shake well before drinking.   Drink 1 bottle of contrast @ 1:30 pm (2 hours prior to your exam)  Drink 1 bottle of contrast @ 2:30 pm (1 hour prior to your exam)   You may take any medications as prescribed with a small amount of water, if necessary. If you take any of the following medications: METFORMIN, GLUCOPHAGE, GLUCOVANCE, AVANDAMET, RIOMET, FORTAMET, Ashley MET, JANUMET, GLUMETZA or METAGLIP, you MAY be asked to HOLD this medication 48 hours AFTER the exam.   The purpose of you drinking the oral contrast is to aid in the visualization of your intestinal tract. The contrast solution may cause some diarrhea. Depending on your individual set of symptoms, you may also receive an intravenous injection of x-ray contrast/dye. Plan on being at [facility] for 45 minutes or longer, depending on the type of exam you are having performed.   If you  have any questions regarding your exam or if you need to reschedule, you may call [facility] at [phone] between the hours of 8:00 am and 5:00 pm, Monday-Friday.   Your provider has requested that you go to the basement level for lab work before leaving today. Press "B" on the elevator. The lab is located at the first door on the left as you exit the elevator.  Due to recent changes in healthcare laws, you may see the results of your imaging and laboratory studies on MyChart before your provider has had a chance to review them.  We understand that in some cases there may be results that are confusing or concerning to you. Not all laboratory results come back in the same time frame and the provider may be waiting for multiple results in order to interpret others.  Please give [place] 48 hours in order for your provider to thoroughly review all the results before contacting the office for clarification of your results.   Follow up ending the results of your CT.

## 2020-06-26 NOTE — Progress Notes (Signed)
Addendum: Reviewed and agree with assessment and management plan. Jone Panebianco M, MD  

## 2020-06-27 ENCOUNTER — Other Ambulatory Visit: Payer: Medicare Other

## 2020-06-27 ENCOUNTER — Other Ambulatory Visit: Payer: Self-pay

## 2020-06-27 DIAGNOSIS — B20 Human immunodeficiency virus [HIV] disease: Secondary | ICD-10-CM

## 2020-06-27 DIAGNOSIS — Z79899 Other long term (current) drug therapy: Secondary | ICD-10-CM | POA: Diagnosis not present

## 2020-06-27 DIAGNOSIS — Z113 Encounter for screening for infections with a predominantly sexual mode of transmission: Secondary | ICD-10-CM

## 2020-06-28 ENCOUNTER — Other Ambulatory Visit: Payer: Medicare Other

## 2020-06-28 DIAGNOSIS — N2581 Secondary hyperparathyroidism of renal origin: Secondary | ICD-10-CM | POA: Diagnosis not present

## 2020-06-28 DIAGNOSIS — R82998 Other abnormal findings in urine: Secondary | ICD-10-CM | POA: Diagnosis not present

## 2020-06-28 DIAGNOSIS — D509 Iron deficiency anemia, unspecified: Secondary | ICD-10-CM | POA: Diagnosis not present

## 2020-06-28 DIAGNOSIS — D631 Anemia in chronic kidney disease: Secondary | ICD-10-CM | POA: Diagnosis not present

## 2020-06-28 DIAGNOSIS — E8770 Fluid overload, unspecified: Secondary | ICD-10-CM | POA: Diagnosis not present

## 2020-06-28 DIAGNOSIS — K7689 Other specified diseases of liver: Secondary | ICD-10-CM | POA: Diagnosis not present

## 2020-06-28 DIAGNOSIS — Z992 Dependence on renal dialysis: Secondary | ICD-10-CM | POA: Diagnosis not present

## 2020-06-28 DIAGNOSIS — Z79899 Other long term (current) drug therapy: Secondary | ICD-10-CM | POA: Diagnosis not present

## 2020-06-28 DIAGNOSIS — E876 Hypokalemia: Secondary | ICD-10-CM | POA: Diagnosis not present

## 2020-06-28 DIAGNOSIS — N186 End stage renal disease: Secondary | ICD-10-CM | POA: Diagnosis not present

## 2020-06-28 DIAGNOSIS — Z4931 Encounter for adequacy testing for hemodialysis: Secondary | ICD-10-CM | POA: Diagnosis not present

## 2020-06-28 DIAGNOSIS — E440 Moderate protein-calorie malnutrition: Secondary | ICD-10-CM | POA: Diagnosis not present

## 2020-06-28 LAB — T-HELPER CELL (CD4) - (RCID CLINIC ONLY)
CD4 % Helper T Cell: 44 % (ref 33–65)
CD4 T Cell Abs: 1175 /uL (ref 400–1790)

## 2020-06-29 DIAGNOSIS — N2581 Secondary hyperparathyroidism of renal origin: Secondary | ICD-10-CM | POA: Diagnosis not present

## 2020-06-29 DIAGNOSIS — E8770 Fluid overload, unspecified: Secondary | ICD-10-CM | POA: Diagnosis not present

## 2020-06-29 DIAGNOSIS — Z992 Dependence on renal dialysis: Secondary | ICD-10-CM | POA: Diagnosis not present

## 2020-06-29 DIAGNOSIS — E876 Hypokalemia: Secondary | ICD-10-CM | POA: Diagnosis not present

## 2020-06-29 DIAGNOSIS — R82998 Other abnormal findings in urine: Secondary | ICD-10-CM | POA: Diagnosis not present

## 2020-06-29 DIAGNOSIS — Z4931 Encounter for adequacy testing for hemodialysis: Secondary | ICD-10-CM | POA: Diagnosis not present

## 2020-06-29 DIAGNOSIS — N186 End stage renal disease: Secondary | ICD-10-CM | POA: Diagnosis not present

## 2020-06-29 DIAGNOSIS — Z79899 Other long term (current) drug therapy: Secondary | ICD-10-CM | POA: Diagnosis not present

## 2020-06-29 DIAGNOSIS — E440 Moderate protein-calorie malnutrition: Secondary | ICD-10-CM | POA: Diagnosis not present

## 2020-06-29 DIAGNOSIS — D631 Anemia in chronic kidney disease: Secondary | ICD-10-CM | POA: Diagnosis not present

## 2020-06-29 DIAGNOSIS — D509 Iron deficiency anemia, unspecified: Secondary | ICD-10-CM | POA: Diagnosis not present

## 2020-06-29 DIAGNOSIS — K7689 Other specified diseases of liver: Secondary | ICD-10-CM | POA: Diagnosis not present

## 2020-06-29 LAB — CBC
HCT: 30.4 % — ABNORMAL LOW (ref 38.5–50.0)
Hemoglobin: 10.8 g/dL — ABNORMAL LOW (ref 13.2–17.1)
MCH: 34.6 pg — ABNORMAL HIGH (ref 27.0–33.0)
MCHC: 35.5 g/dL (ref 32.0–36.0)
MCV: 97.4 fL (ref 80.0–100.0)
MPV: 10.5 fL (ref 7.5–12.5)
Platelets: 141 10*3/uL (ref 140–400)
RBC: 3.12 10*6/uL — ABNORMAL LOW (ref 4.20–5.80)
RDW: 13.7 % (ref 11.0–15.0)
WBC: 7.1 10*3/uL (ref 3.8–10.8)

## 2020-06-29 LAB — LIPID PANEL
Cholesterol: 180 mg/dL (ref <200)
HDL: 37 mg/dL — ABNORMAL LOW (ref >=40)
LDL Cholesterol (Calc): 113 mg/dL (calc) — ABNORMAL HIGH
Non-HDL Cholesterol (Calc): 143 mg/dL (calc) — ABNORMAL HIGH (ref <130)
Total CHOL/HDL Ratio: 4.9 (calc) (ref <5.0)
Triglycerides: 183 mg/dL — ABNORMAL HIGH (ref <150)

## 2020-06-29 LAB — COMPREHENSIVE METABOLIC PANEL
AG Ratio: 1.6 (calc) (ref 1.0–2.5)
ALT: 55 U/L — ABNORMAL HIGH (ref 9–46)
AST: 36 U/L — ABNORMAL HIGH (ref 10–35)
Albumin: 4.8 g/dL (ref 3.6–5.1)
Alkaline phosphatase (APISO): 79 U/L (ref 35–144)
BUN/Creatinine Ratio: 5 (calc) — ABNORMAL LOW (ref 6–22)
BUN: 33 mg/dL — ABNORMAL HIGH (ref 7–25)
CO2: 30 mmol/L (ref 20–32)
Calcium: 9.9 mg/dL (ref 8.6–10.3)
Chloride: 94 mmol/L — ABNORMAL LOW (ref 98–110)
Creat: 6.55 mg/dL — ABNORMAL HIGH (ref 0.70–1.33)
Globulin: 3 g/dL (calc) (ref 1.9–3.7)
Glucose, Bld: 106 mg/dL — ABNORMAL HIGH (ref 65–99)
Potassium: 3.5 mmol/L (ref 3.5–5.3)
Sodium: 138 mmol/L (ref 135–146)
Total Bilirubin: 0.3 mg/dL (ref 0.2–1.2)
Total Protein: 7.8 g/dL (ref 6.1–8.1)

## 2020-06-29 LAB — RPR: RPR Ser Ql: NONREACTIVE

## 2020-06-29 LAB — HIV-1 RNA QUANT-NO REFLEX-BLD
HIV 1 RNA Quant: <20 Copies/mL
HIV-1 RNA Quant, Log: <1.3 Log cps/mL

## 2020-07-02 DIAGNOSIS — Z992 Dependence on renal dialysis: Secondary | ICD-10-CM | POA: Diagnosis not present

## 2020-07-02 DIAGNOSIS — D509 Iron deficiency anemia, unspecified: Secondary | ICD-10-CM | POA: Diagnosis not present

## 2020-07-02 DIAGNOSIS — N2581 Secondary hyperparathyroidism of renal origin: Secondary | ICD-10-CM | POA: Diagnosis not present

## 2020-07-02 DIAGNOSIS — E8770 Fluid overload, unspecified: Secondary | ICD-10-CM | POA: Diagnosis not present

## 2020-07-02 DIAGNOSIS — D631 Anemia in chronic kidney disease: Secondary | ICD-10-CM | POA: Diagnosis not present

## 2020-07-02 DIAGNOSIS — Z4931 Encounter for adequacy testing for hemodialysis: Secondary | ICD-10-CM | POA: Diagnosis not present

## 2020-07-02 DIAGNOSIS — E876 Hypokalemia: Secondary | ICD-10-CM | POA: Diagnosis not present

## 2020-07-02 DIAGNOSIS — K7689 Other specified diseases of liver: Secondary | ICD-10-CM | POA: Diagnosis not present

## 2020-07-02 DIAGNOSIS — Z79899 Other long term (current) drug therapy: Secondary | ICD-10-CM | POA: Diagnosis not present

## 2020-07-02 DIAGNOSIS — R82998 Other abnormal findings in urine: Secondary | ICD-10-CM | POA: Diagnosis not present

## 2020-07-02 DIAGNOSIS — N186 End stage renal disease: Secondary | ICD-10-CM | POA: Diagnosis not present

## 2020-07-02 DIAGNOSIS — E440 Moderate protein-calorie malnutrition: Secondary | ICD-10-CM | POA: Diagnosis not present

## 2020-07-03 ENCOUNTER — Other Ambulatory Visit: Payer: Self-pay | Admitting: Allergy & Immunology

## 2020-07-03 DIAGNOSIS — K7689 Other specified diseases of liver: Secondary | ICD-10-CM | POA: Diagnosis not present

## 2020-07-03 DIAGNOSIS — R82998 Other abnormal findings in urine: Secondary | ICD-10-CM | POA: Diagnosis not present

## 2020-07-03 DIAGNOSIS — N186 End stage renal disease: Secondary | ICD-10-CM | POA: Diagnosis not present

## 2020-07-03 DIAGNOSIS — Z79899 Other long term (current) drug therapy: Secondary | ICD-10-CM | POA: Diagnosis not present

## 2020-07-03 DIAGNOSIS — E876 Hypokalemia: Secondary | ICD-10-CM | POA: Diagnosis not present

## 2020-07-03 DIAGNOSIS — N2581 Secondary hyperparathyroidism of renal origin: Secondary | ICD-10-CM | POA: Diagnosis not present

## 2020-07-03 DIAGNOSIS — Z4931 Encounter for adequacy testing for hemodialysis: Secondary | ICD-10-CM | POA: Diagnosis not present

## 2020-07-03 DIAGNOSIS — D509 Iron deficiency anemia, unspecified: Secondary | ICD-10-CM | POA: Diagnosis not present

## 2020-07-03 DIAGNOSIS — Z992 Dependence on renal dialysis: Secondary | ICD-10-CM | POA: Diagnosis not present

## 2020-07-03 DIAGNOSIS — D631 Anemia in chronic kidney disease: Secondary | ICD-10-CM | POA: Diagnosis not present

## 2020-07-03 DIAGNOSIS — E440 Moderate protein-calorie malnutrition: Secondary | ICD-10-CM | POA: Diagnosis not present

## 2020-07-03 DIAGNOSIS — E8770 Fluid overload, unspecified: Secondary | ICD-10-CM | POA: Diagnosis not present

## 2020-07-05 DIAGNOSIS — E876 Hypokalemia: Secondary | ICD-10-CM | POA: Diagnosis not present

## 2020-07-05 DIAGNOSIS — K7689 Other specified diseases of liver: Secondary | ICD-10-CM | POA: Diagnosis not present

## 2020-07-05 DIAGNOSIS — N2581 Secondary hyperparathyroidism of renal origin: Secondary | ICD-10-CM | POA: Diagnosis not present

## 2020-07-05 DIAGNOSIS — Z4931 Encounter for adequacy testing for hemodialysis: Secondary | ICD-10-CM | POA: Diagnosis not present

## 2020-07-05 DIAGNOSIS — N186 End stage renal disease: Secondary | ICD-10-CM | POA: Diagnosis not present

## 2020-07-05 DIAGNOSIS — E8770 Fluid overload, unspecified: Secondary | ICD-10-CM | POA: Diagnosis not present

## 2020-07-05 DIAGNOSIS — R82998 Other abnormal findings in urine: Secondary | ICD-10-CM | POA: Diagnosis not present

## 2020-07-05 DIAGNOSIS — D509 Iron deficiency anemia, unspecified: Secondary | ICD-10-CM | POA: Diagnosis not present

## 2020-07-05 DIAGNOSIS — Z992 Dependence on renal dialysis: Secondary | ICD-10-CM | POA: Diagnosis not present

## 2020-07-05 DIAGNOSIS — Z79899 Other long term (current) drug therapy: Secondary | ICD-10-CM | POA: Diagnosis not present

## 2020-07-05 DIAGNOSIS — D631 Anemia in chronic kidney disease: Secondary | ICD-10-CM | POA: Diagnosis not present

## 2020-07-05 DIAGNOSIS — E440 Moderate protein-calorie malnutrition: Secondary | ICD-10-CM | POA: Diagnosis not present

## 2020-07-06 ENCOUNTER — Other Ambulatory Visit: Payer: Self-pay | Admitting: Family Medicine

## 2020-07-06 ENCOUNTER — Telehealth: Payer: Self-pay | Admitting: Gastroenterology

## 2020-07-06 DIAGNOSIS — K7689 Other specified diseases of liver: Secondary | ICD-10-CM | POA: Diagnosis not present

## 2020-07-06 DIAGNOSIS — E440 Moderate protein-calorie malnutrition: Secondary | ICD-10-CM | POA: Diagnosis not present

## 2020-07-06 DIAGNOSIS — E8770 Fluid overload, unspecified: Secondary | ICD-10-CM | POA: Diagnosis not present

## 2020-07-06 DIAGNOSIS — E876 Hypokalemia: Secondary | ICD-10-CM | POA: Diagnosis not present

## 2020-07-06 DIAGNOSIS — N186 End stage renal disease: Secondary | ICD-10-CM | POA: Diagnosis not present

## 2020-07-06 DIAGNOSIS — N2581 Secondary hyperparathyroidism of renal origin: Secondary | ICD-10-CM | POA: Diagnosis not present

## 2020-07-06 DIAGNOSIS — D631 Anemia in chronic kidney disease: Secondary | ICD-10-CM | POA: Diagnosis not present

## 2020-07-06 DIAGNOSIS — Z992 Dependence on renal dialysis: Secondary | ICD-10-CM | POA: Diagnosis not present

## 2020-07-06 DIAGNOSIS — R82998 Other abnormal findings in urine: Secondary | ICD-10-CM | POA: Diagnosis not present

## 2020-07-06 DIAGNOSIS — Z79899 Other long term (current) drug therapy: Secondary | ICD-10-CM | POA: Diagnosis not present

## 2020-07-06 DIAGNOSIS — D509 Iron deficiency anemia, unspecified: Secondary | ICD-10-CM | POA: Diagnosis not present

## 2020-07-06 DIAGNOSIS — Z4931 Encounter for adequacy testing for hemodialysis: Secondary | ICD-10-CM | POA: Diagnosis not present

## 2020-07-06 NOTE — Telephone Encounter (Signed)
Tried to contact the imaging department with no answer.

## 2020-07-06 NOTE — Telephone Encounter (Signed)
He gets dialysis so we discussed that he should be able to get the contrast.

## 2020-07-06 NOTE — Telephone Encounter (Signed)
It looks like his CMP on 06/27/20 shows that his Creat. Value shot up to 6.55. How do you want to proceed?

## 2020-07-09 DIAGNOSIS — E8770 Fluid overload, unspecified: Secondary | ICD-10-CM | POA: Diagnosis not present

## 2020-07-09 DIAGNOSIS — R82998 Other abnormal findings in urine: Secondary | ICD-10-CM | POA: Diagnosis not present

## 2020-07-09 DIAGNOSIS — Z4931 Encounter for adequacy testing for hemodialysis: Secondary | ICD-10-CM | POA: Diagnosis not present

## 2020-07-09 DIAGNOSIS — Z992 Dependence on renal dialysis: Secondary | ICD-10-CM | POA: Diagnosis not present

## 2020-07-09 DIAGNOSIS — D509 Iron deficiency anemia, unspecified: Secondary | ICD-10-CM | POA: Diagnosis not present

## 2020-07-09 DIAGNOSIS — E440 Moderate protein-calorie malnutrition: Secondary | ICD-10-CM | POA: Diagnosis not present

## 2020-07-09 DIAGNOSIS — D631 Anemia in chronic kidney disease: Secondary | ICD-10-CM | POA: Diagnosis not present

## 2020-07-09 DIAGNOSIS — K7689 Other specified diseases of liver: Secondary | ICD-10-CM | POA: Diagnosis not present

## 2020-07-09 DIAGNOSIS — N186 End stage renal disease: Secondary | ICD-10-CM | POA: Diagnosis not present

## 2020-07-09 DIAGNOSIS — Z79899 Other long term (current) drug therapy: Secondary | ICD-10-CM | POA: Diagnosis not present

## 2020-07-09 DIAGNOSIS — E876 Hypokalemia: Secondary | ICD-10-CM | POA: Diagnosis not present

## 2020-07-09 DIAGNOSIS — N2581 Secondary hyperparathyroidism of renal origin: Secondary | ICD-10-CM | POA: Diagnosis not present

## 2020-07-09 NOTE — Telephone Encounter (Signed)
Tried to reach imaging with no answer

## 2020-07-10 ENCOUNTER — Telehealth: Payer: Self-pay | Admitting: Gastroenterology

## 2020-07-10 DIAGNOSIS — E440 Moderate protein-calorie malnutrition: Secondary | ICD-10-CM | POA: Diagnosis not present

## 2020-07-10 DIAGNOSIS — N186 End stage renal disease: Secondary | ICD-10-CM | POA: Diagnosis not present

## 2020-07-10 DIAGNOSIS — Z992 Dependence on renal dialysis: Secondary | ICD-10-CM | POA: Diagnosis not present

## 2020-07-10 DIAGNOSIS — K7689 Other specified diseases of liver: Secondary | ICD-10-CM | POA: Diagnosis not present

## 2020-07-10 DIAGNOSIS — E8770 Fluid overload, unspecified: Secondary | ICD-10-CM | POA: Diagnosis not present

## 2020-07-10 DIAGNOSIS — D631 Anemia in chronic kidney disease: Secondary | ICD-10-CM | POA: Diagnosis not present

## 2020-07-10 DIAGNOSIS — N2581 Secondary hyperparathyroidism of renal origin: Secondary | ICD-10-CM | POA: Diagnosis not present

## 2020-07-10 DIAGNOSIS — Z4931 Encounter for adequacy testing for hemodialysis: Secondary | ICD-10-CM | POA: Diagnosis not present

## 2020-07-10 DIAGNOSIS — E876 Hypokalemia: Secondary | ICD-10-CM | POA: Diagnosis not present

## 2020-07-10 DIAGNOSIS — R82998 Other abnormal findings in urine: Secondary | ICD-10-CM | POA: Diagnosis not present

## 2020-07-10 DIAGNOSIS — D509 Iron deficiency anemia, unspecified: Secondary | ICD-10-CM | POA: Diagnosis not present

## 2020-07-10 DIAGNOSIS — Z79899 Other long term (current) drug therapy: Secondary | ICD-10-CM | POA: Diagnosis not present

## 2020-07-10 NOTE — Telephone Encounter (Signed)
Albert Eaton has been notified that the patient is on Dialysis and we choose the particular day due to having dialysis the next day. It has been expressed that patient can still have contrast.

## 2020-07-10 NOTE — Telephone Encounter (Signed)
See previous message

## 2020-07-10 NOTE — Telephone Encounter (Signed)
Vonda called from radiology and stated his Creatinine is 3.84 and would reuire another order for abd\pelvic without.

## 2020-07-11 ENCOUNTER — Encounter: Payer: Self-pay | Admitting: Infectious Diseases

## 2020-07-11 ENCOUNTER — Ambulatory Visit (HOSPITAL_COMMUNITY)
Admission: RE | Admit: 2020-07-11 | Discharge: 2020-07-11 | Disposition: A | Discharge status: Home / Self Care | Payer: Medicare Other | Source: Ambulatory Visit | Attending: Gastroenterology | Admitting: Gastroenterology

## 2020-07-11 ENCOUNTER — Other Ambulatory Visit: Payer: Self-pay

## 2020-07-11 ENCOUNTER — Ambulatory Visit (INDEPENDENT_AMBULATORY_CARE_PROVIDER_SITE_OTHER): Payer: Medicare Other | Admitting: Infectious Diseases

## 2020-07-11 VITALS — BP 122/87 | HR 88 | Temp 98.5°F | Ht 70.0 in | Wt 204.0 lb

## 2020-07-11 DIAGNOSIS — Z79899 Other long term (current) drug therapy: Secondary | ICD-10-CM

## 2020-07-11 DIAGNOSIS — B20 Human immunodeficiency virus [HIV] disease: Secondary | ICD-10-CM

## 2020-07-11 DIAGNOSIS — R110 Nausea: Secondary | ICD-10-CM

## 2020-07-11 DIAGNOSIS — I5032 Chronic diastolic (congestive) heart failure: Secondary | ICD-10-CM

## 2020-07-11 DIAGNOSIS — Z9049 Acquired absence of other specified parts of digestive tract: Secondary | ICD-10-CM | POA: Diagnosis not present

## 2020-07-11 DIAGNOSIS — I770 Arteriovenous fistula, acquired: Secondary | ICD-10-CM

## 2020-07-11 DIAGNOSIS — R1030 Lower abdominal pain, unspecified: Secondary | ICD-10-CM

## 2020-07-11 DIAGNOSIS — R1032 Left lower quadrant pain: Secondary | ICD-10-CM | POA: Diagnosis not present

## 2020-07-11 DIAGNOSIS — Z113 Encounter for screening for infections with a predominantly sexual mode of transmission: Secondary | ICD-10-CM

## 2020-07-11 DIAGNOSIS — N184 Chronic kidney disease, stage 4 (severe): Secondary | ICD-10-CM | POA: Diagnosis not present

## 2020-07-11 DIAGNOSIS — Z23 Encounter for immunization: Secondary | ICD-10-CM

## 2020-07-11 IMAGING — CT CT ABD-PELV W/ CM
2 of 5 series · 15 of 46 positions shown, 17 images · IV contrast (omnipaque)
Comparison: CT the abdomen and pelvis [DATE].

CLINICAL DATA: 54-year-old male with history of left lower quadrant
abdominal pain for the past 3 months.

EXAM:
CT ABDOMEN AND PELVIS WITH CONTRAST
TECHNIQUE: Multidetector CT imaging of the abdomen and pelvis was performed
using the standard protocol following bolus administration of
intravenous contrast.
CONTRAST:  100mL OMNIPAQUE IOHEXOL 300 MG/ML  SOLN

[Series 2: axial st · axial · 0.79mm/px · z∈[+924,+1359]mm · 12 of 101 slices shown, 14 images]
[im 7/101  soft-tissue]
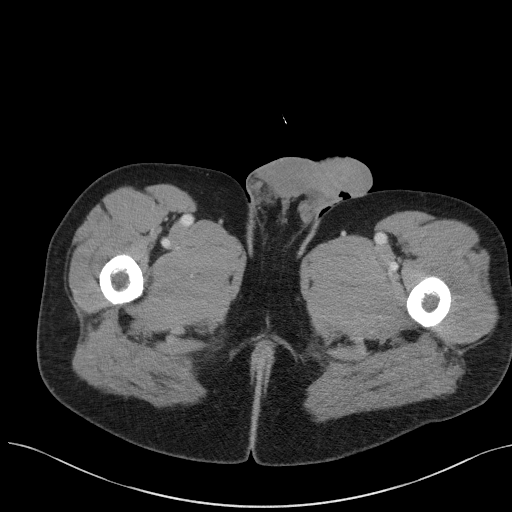
[im 7/101  bone]
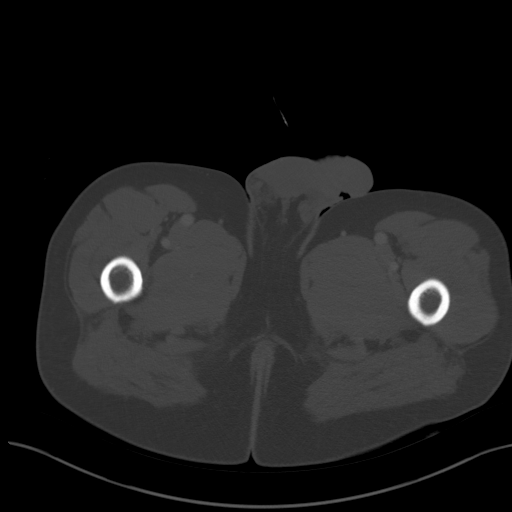
[im 14/101  soft-tissue]
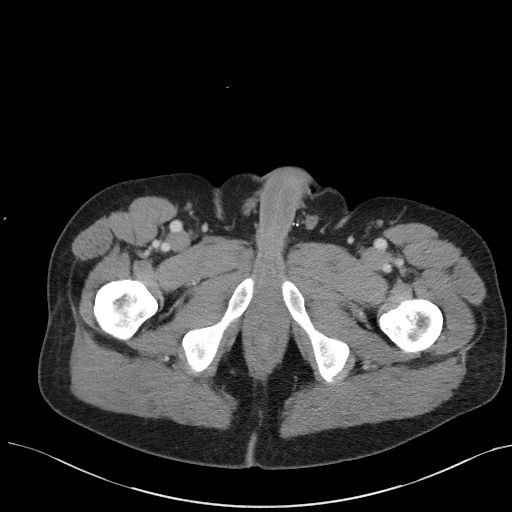
[im 21/101  soft-tissue]
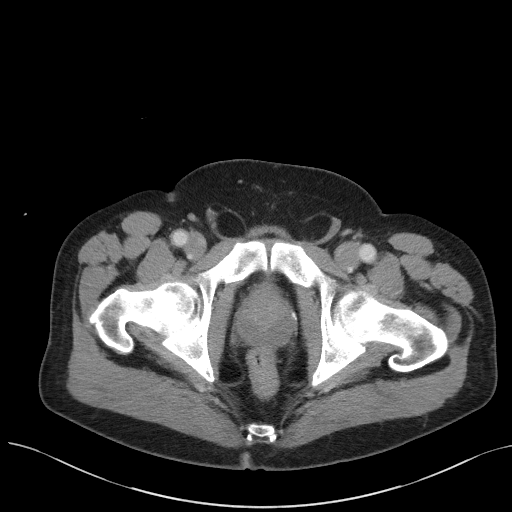
[im 34/101  soft-tissue]
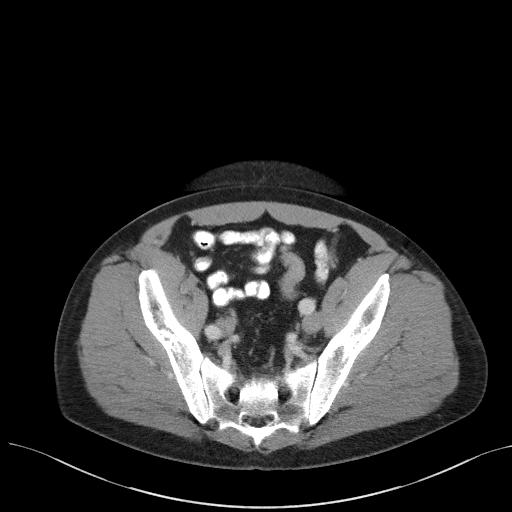
[im 41/101  soft-tissue]
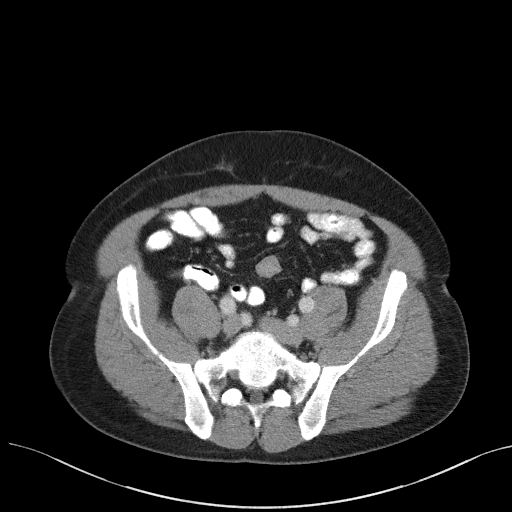
[im 47/101  soft-tissue]
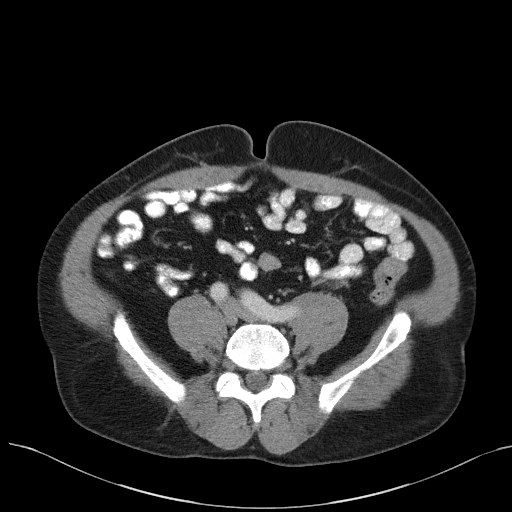
[im 54/101  soft-tissue]
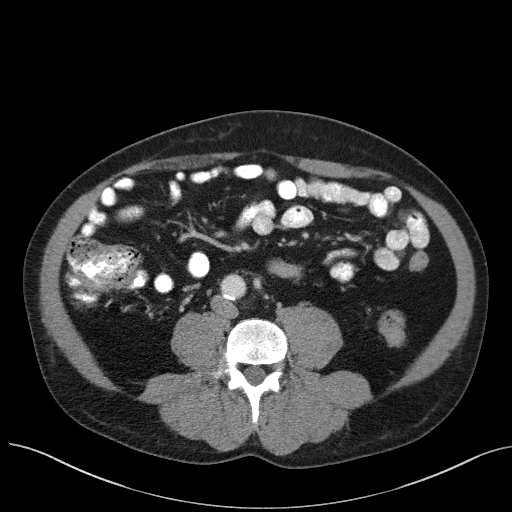
[im 61/101  soft-tissue]
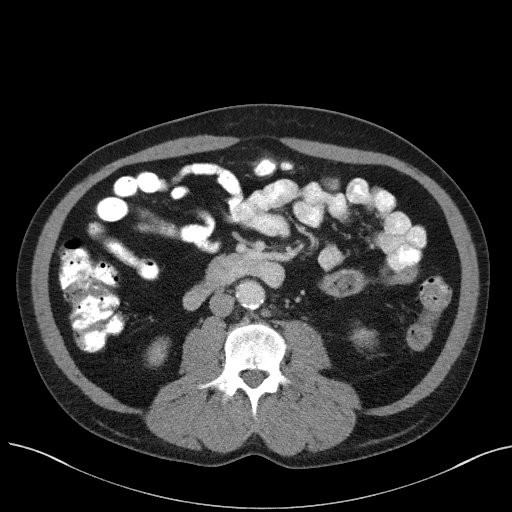
[im 67/101  soft-tissue]
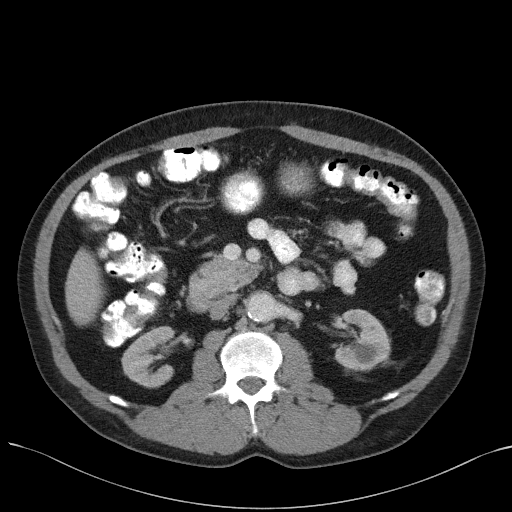
[im 67/101  bone]
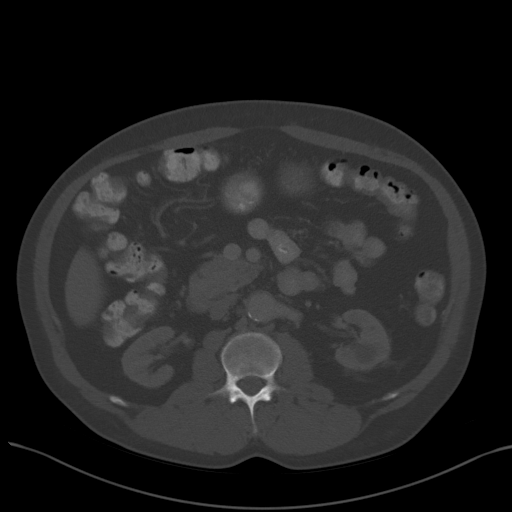
[im 81/101  soft-tissue]
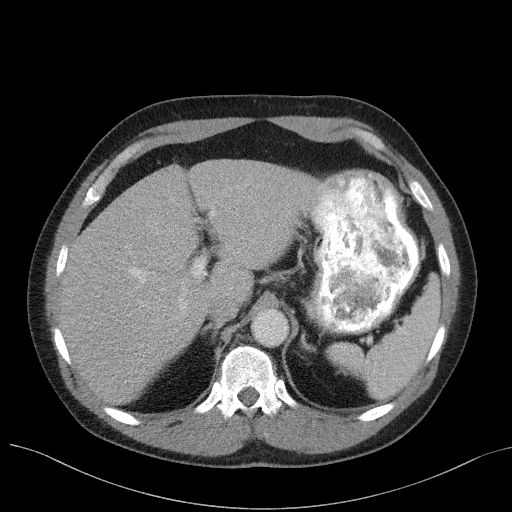
[im 87/101  soft-tissue]
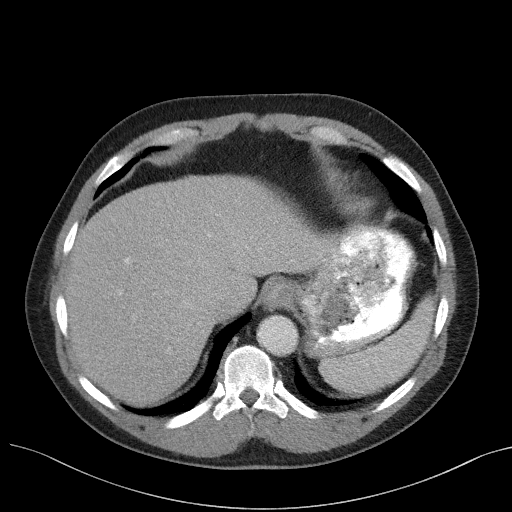
[im 94/101  soft-tissue]
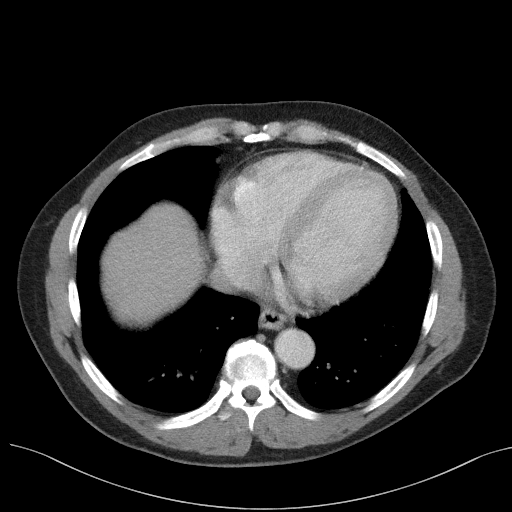

[Series 5: coronal st · coronal · 0.76mm/px · 3 of 101 slices shown]
[im 34/101  soft-tissue]
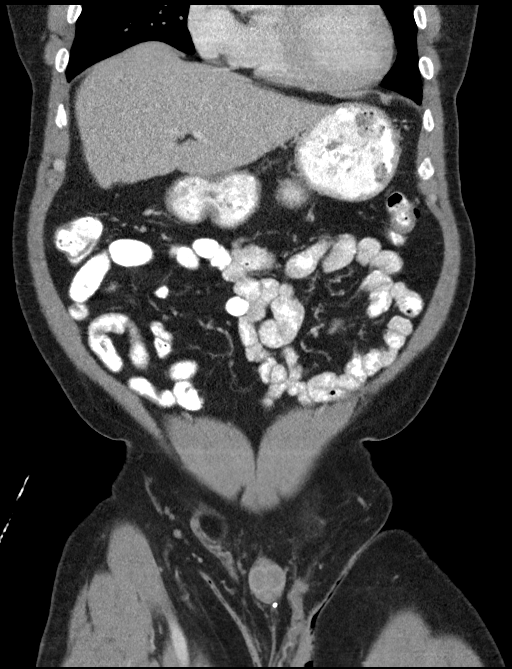
[im 45/101  soft-tissue]
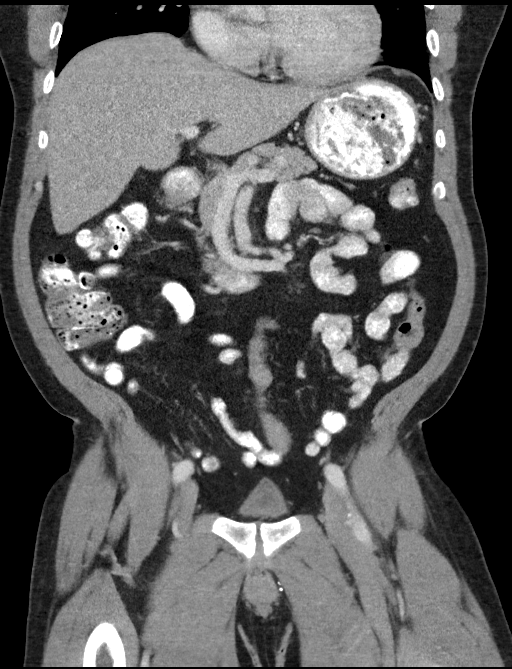
[im 56/101  soft-tissue]
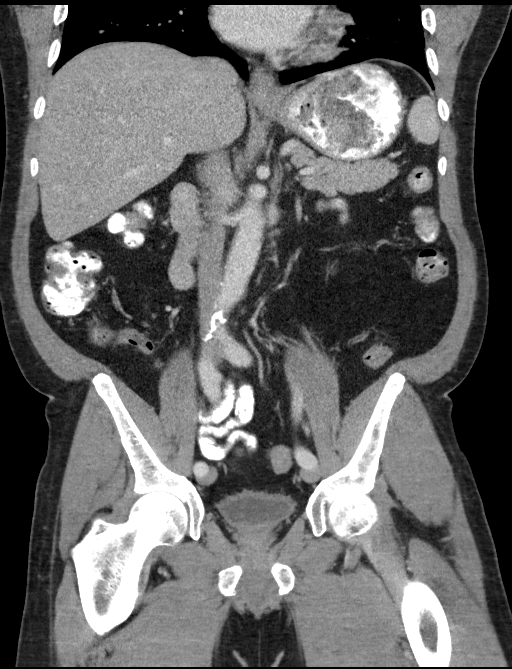

[15 of 46 positions shown; findings below may reference images not displayed]

FINDINGS: Lower chest: Unremarkable.

Hepatobiliary: No suspicious cystic or solid hepatic lesions. No
intra or extrahepatic biliary ductal dilatation. Status post
cholecystectomy.

Pancreas: No pancreatic mass. No pancreatic ductal dilatation. No
pancreatic or peripancreatic fluid collections or inflammatory
changes.

Spleen: Unremarkable.

Adrenals/Urinary Tract: Low-attenuation lesions in both kidneys,
compatible with simple cysts, measuring up to 1.7 cm in the
interpolar region of the left kidney. Additional subcentimeter
low-attenuation lesions in both kidneys are too small to
definitively characterize, but statistically likely to represent
cysts. In addition, there is a very subtle lesion in the interpolar
region of the left kidney measuring 1.3 cm in diameter (axial image
31 of series 2 and axial image 14 of series 7) which may have
internal enhancement (assessment is limited without the presence of
noncontrast imaging), concerning for potential neoplasm, but
considered indeterminate. Bilateral adrenal glands are normal in
appearance. No hydroureteronephrosis. Urinary bladder is nearly
decompressed, but otherwise unremarkable in appearance.

Stomach/Bowel: Normal appearance of the stomach. No pathologic
dilatation of small bowel or colon. Normal appendix.

Vascular/Lymphatic: Aortic atherosclerosis, without evidence of
aneurysm or dissection in the abdominal or pelvic vasculature. No
lymphadenopathy noted in the abdomen or pelvis.

Reproductive: Prostate gland and seminal vesicles are unremarkable
in appearance.

Other: No significant volume of ascites.  No pneumoperitoneum.

Musculoskeletal: There are no aggressive appearing lytic or blastic
lesions noted in the visualized portions of the skeleton.
IMPRESSION: 1. No acute findings are noted in the abdomen or pelvis to account
for the patient's symptoms.
2. Multiple renal lesions bilaterally, the majority of which likely
represent small cysts. In addition, there is a 1.3 cm indeterminate
lesion in the interpolar region of the left kidney which warrants
further evaluation with follow-up nonemergent abdominal MRI with and
without IV gadolinium in the near future to exclude the possibility
of renal neoplasm.
3. Aortic atherosclerosis.

## 2020-07-11 MED ORDER — VITAMIN D (ERGOCALCIFEROL) 1.25 MG (50000 UNIT) PO CAPS: ORAL_CAPSULE | ORAL | 0 refills | Status: DC

## 2020-07-11 MED ORDER — IOHEXOL 300 MG/ML  SOLN
100.0000 mL | Freq: Once | INTRAMUSCULAR | Status: AC | PRN
  Administered 2020-07-11: 100 mL via INTRAVENOUS

## 2020-07-11 NOTE — Assessment & Plan Note (Signed)
Consistent with his GERD as documented by GI Suspect his chest issues are related to this. He is clear his chest tightness is not chest pain.  CV f/u is reasonable.

## 2020-07-11 NOTE — Assessment & Plan Note (Signed)
Appears to be well controlled. No edema.  See GERD.

## 2020-07-11 NOTE — Assessment & Plan Note (Signed)
He is doing well His COVID vaccine has been completed. Flu vax today Offered/refused condoms.  rtc in 9 months.

## 2020-07-11 NOTE — Assessment & Plan Note (Signed)
functioning well.  Will continue to watch, appreciate renal f/u.

## 2020-07-11 NOTE — Progress Notes (Signed)
   Subjective:    Patient ID: Albert Eaton, male    DOB: [birth year], 54 y.o.   MRN: [id number]  HPI  54y.o.Mwith a history of CHF (nonischemic non-dilated CMwith NYHA 2/C HF preserved EF 55% with moderate concentric hypertrophy and a grade 2 diastolic dysfunction. Moderate mitral valve insufficiency), CKD V (started HD 05-13-19), HTN, HLD, OSA, neuropathy,and HIV(dx 1991). Also chronic constipation (on amtyza and prn lactulose). Ashtma, GERD.  He had kidney bx 9-18showing focal nephrosclerosis, interstitial fibrosis. Awaiting kidney txp.  No problems with fistula.  Taking Isentress, Epivir, abacavir.No problems. Tired today, attributes to HD. Yesterday had some shallow breathing, chest tightness, nausea. Has this "every once in a while". Resolves with famotidine.  Has gotten COVID vax, wants flu vax today.   HIV 1 RNA Quant  Date Value  06/27/2020 <20 Copies/mL  10/06/2019 <20 NOT DETECTED copies/mL  11/22/2018 <20 NOT DETECTED copies/mL   CD4 T Cell Abs (/uL)  Date Value  06/27/2020 1,175  10/06/2019 916  11/22/2018 740    Review of Systems  Constitutional: Negative for activity change, appetite change, chills, fever and unexpected weight change.  Respiratory: Positive for chest tightness and shortness of breath.   Cardiovascular: Negative for chest pain and leg swelling.  Gastrointestinal: Positive for constipation. Negative for diarrhea.  Psychiatric/Behavioral: Positive for sleep disturbance.       Objective:   Physical Exam Vitals reviewed.  Constitutional:      General: He is not in acute distress.    Appearance: Normal appearance. He is normal weight. He is not ill-appearing, toxic-appearing or diaphoretic.  HENT:     Mouth/Throat:     Mouth: Mucous membranes are moist.     Pharynx: No oropharyngeal exudate.  Eyes:     Extraocular Movements: Extraocular movements intact.     Pupils: Pupils are equal, round, and reactive to light.  Cardiovascular:      Rate and Rhythm: Normal rate and regular rhythm.  Pulmonary:     Effort: Pulmonary effort is normal.     Breath sounds: Normal breath sounds.  Abdominal:     General: Bowel sounds are normal. There is no distension.     Palpations: Abdomen is soft.     Tenderness: There is no abdominal tenderness.  Musculoskeletal:       Arms:     Cervical back: Normal range of motion and neck supple.     Right lower leg: No edema.     Left lower leg: No edema.  Neurological:     Mental Status: He is alert.           Assessment & Plan:

## 2020-07-11 NOTE — Assessment & Plan Note (Signed)
Awaiting renal txp.  My great appreciation to his renal doc's.

## 2020-07-12 ENCOUNTER — Encounter: Payer: Medicare Other | Admitting: Infectious Diseases

## 2020-07-12 DIAGNOSIS — N2581 Secondary hyperparathyroidism of renal origin: Secondary | ICD-10-CM | POA: Diagnosis not present

## 2020-07-12 DIAGNOSIS — E876 Hypokalemia: Secondary | ICD-10-CM | POA: Diagnosis not present

## 2020-07-12 DIAGNOSIS — D509 Iron deficiency anemia, unspecified: Secondary | ICD-10-CM | POA: Diagnosis not present

## 2020-07-12 DIAGNOSIS — K7689 Other specified diseases of liver: Secondary | ICD-10-CM | POA: Diagnosis not present

## 2020-07-12 DIAGNOSIS — N186 End stage renal disease: Secondary | ICD-10-CM | POA: Diagnosis not present

## 2020-07-12 DIAGNOSIS — Z79899 Other long term (current) drug therapy: Secondary | ICD-10-CM | POA: Diagnosis not present

## 2020-07-12 DIAGNOSIS — E440 Moderate protein-calorie malnutrition: Secondary | ICD-10-CM | POA: Diagnosis not present

## 2020-07-12 DIAGNOSIS — D631 Anemia in chronic kidney disease: Secondary | ICD-10-CM | POA: Diagnosis not present

## 2020-07-12 DIAGNOSIS — Z4931 Encounter for adequacy testing for hemodialysis: Secondary | ICD-10-CM | POA: Diagnosis not present

## 2020-07-12 DIAGNOSIS — Z992 Dependence on renal dialysis: Secondary | ICD-10-CM | POA: Diagnosis not present

## 2020-07-12 DIAGNOSIS — E8770 Fluid overload, unspecified: Secondary | ICD-10-CM | POA: Diagnosis not present

## 2020-07-12 DIAGNOSIS — R82998 Other abnormal findings in urine: Secondary | ICD-10-CM | POA: Diagnosis not present

## 2020-07-13 DIAGNOSIS — E440 Moderate protein-calorie malnutrition: Secondary | ICD-10-CM | POA: Diagnosis not present

## 2020-07-13 DIAGNOSIS — Z992 Dependence on renal dialysis: Secondary | ICD-10-CM | POA: Diagnosis not present

## 2020-07-13 DIAGNOSIS — D509 Iron deficiency anemia, unspecified: Secondary | ICD-10-CM | POA: Diagnosis not present

## 2020-07-13 DIAGNOSIS — N186 End stage renal disease: Secondary | ICD-10-CM | POA: Diagnosis not present

## 2020-07-13 DIAGNOSIS — K7689 Other specified diseases of liver: Secondary | ICD-10-CM | POA: Diagnosis not present

## 2020-07-13 DIAGNOSIS — Z79899 Other long term (current) drug therapy: Secondary | ICD-10-CM | POA: Diagnosis not present

## 2020-07-13 DIAGNOSIS — E876 Hypokalemia: Secondary | ICD-10-CM | POA: Diagnosis not present

## 2020-07-13 DIAGNOSIS — Z4931 Encounter for adequacy testing for hemodialysis: Secondary | ICD-10-CM | POA: Diagnosis not present

## 2020-07-13 DIAGNOSIS — R82998 Other abnormal findings in urine: Secondary | ICD-10-CM | POA: Diagnosis not present

## 2020-07-13 DIAGNOSIS — E8770 Fluid overload, unspecified: Secondary | ICD-10-CM | POA: Diagnosis not present

## 2020-07-13 DIAGNOSIS — N2581 Secondary hyperparathyroidism of renal origin: Secondary | ICD-10-CM | POA: Diagnosis not present

## 2020-07-13 DIAGNOSIS — D631 Anemia in chronic kidney disease: Secondary | ICD-10-CM | POA: Diagnosis not present

## 2020-07-14 ENCOUNTER — Other Ambulatory Visit: Payer: Self-pay | Admitting: Family Medicine

## 2020-07-16 ENCOUNTER — Encounter: Payer: Self-pay | Admitting: Dermatology

## 2020-07-16 ENCOUNTER — Other Ambulatory Visit: Payer: Self-pay

## 2020-07-16 ENCOUNTER — Ambulatory Visit (INDEPENDENT_AMBULATORY_CARE_PROVIDER_SITE_OTHER): Payer: Medicare Other | Admitting: Dermatology

## 2020-07-16 DIAGNOSIS — N2581 Secondary hyperparathyroidism of renal origin: Secondary | ICD-10-CM | POA: Diagnosis not present

## 2020-07-16 DIAGNOSIS — Z79899 Other long term (current) drug therapy: Secondary | ICD-10-CM | POA: Diagnosis not present

## 2020-07-16 DIAGNOSIS — B359 Dermatophytosis, unspecified: Secondary | ICD-10-CM

## 2020-07-16 DIAGNOSIS — D631 Anemia in chronic kidney disease: Secondary | ICD-10-CM | POA: Diagnosis not present

## 2020-07-16 DIAGNOSIS — E440 Moderate protein-calorie malnutrition: Secondary | ICD-10-CM | POA: Diagnosis not present

## 2020-07-16 DIAGNOSIS — L83 Acanthosis nigricans: Secondary | ICD-10-CM | POA: Diagnosis not present

## 2020-07-16 DIAGNOSIS — K7689 Other specified diseases of liver: Secondary | ICD-10-CM | POA: Diagnosis not present

## 2020-07-16 DIAGNOSIS — Z4931 Encounter for adequacy testing for hemodialysis: Secondary | ICD-10-CM | POA: Diagnosis not present

## 2020-07-16 DIAGNOSIS — D509 Iron deficiency anemia, unspecified: Secondary | ICD-10-CM | POA: Diagnosis not present

## 2020-07-16 DIAGNOSIS — E876 Hypokalemia: Secondary | ICD-10-CM | POA: Diagnosis not present

## 2020-07-16 DIAGNOSIS — R82998 Other abnormal findings in urine: Secondary | ICD-10-CM | POA: Diagnosis not present

## 2020-07-16 DIAGNOSIS — B353 Tinea pedis: Secondary | ICD-10-CM | POA: Diagnosis not present

## 2020-07-16 DIAGNOSIS — N186 End stage renal disease: Secondary | ICD-10-CM | POA: Diagnosis not present

## 2020-07-16 DIAGNOSIS — Z992 Dependence on renal dialysis: Secondary | ICD-10-CM | POA: Diagnosis not present

## 2020-07-16 DIAGNOSIS — E8770 Fluid overload, unspecified: Secondary | ICD-10-CM | POA: Diagnosis not present

## 2020-07-16 LAB — POCT SKIN KOH: Skin KOH, POC: NEGATIVE

## 2020-07-16 NOTE — Patient Instructions (Addendum)
First time I have met Albert Eaton date of birth 02/07/1966.  Background medical information includes  well-controlled HIV and renal failure with chronic dialysis.  Since April 2018 he has had itching under both underarms.  He was tried on a short course of prednisone without significant clearing and this has waxed and waned ever since.  Anything moist whether it's deodorant or sweating may be an exacerbating factor.  Examination showed a subtly thickened diffuse hyperpigmentation under both arms reminiscent of acanthosis nigracans.  There is no involvement of the hands or the neck.  Initial KOH on the right underarm showed no fungus but this will be repeated in 1 hour and a fungal culture obtained.  There is no adenopathy under the right arm.  There is chronic fungus of multiple toenails and the right outer toe web, but clinically the axillary rash is not suggestive of tinea.  Some of the pigmentation may be related to scratching and postinflammatory hyperpigmentation.  Pending fungal culture, Albert Eaton will get over-the-counter 1 to 2% Chlortrimazole cream or lotion and apply this daily after bathing to both underarms.  There should be no side effect.  Additionally because scratching can perpetuate a repetitive scratch itch cycle, he will look for any over-the-counter cream containing pramoxine which may include CeraVe itch relief or [place] antiitch or he could Google search the word  pramoxine.  He can apply this under the arms from 0-10 times a day as needed if there is itching.  If there is no improvement in 4 weeks, I have asked him to please call so we can review the results of the fungal culture.  If this is negative, I will likely try a noncortisone anti-inflammatory like tacrolimus ointment.  If this also produces no significant benefit, I have asked him to return for reexamination and possible biopsy in 2 months. 

## 2020-07-17 DIAGNOSIS — K7689 Other specified diseases of liver: Secondary | ICD-10-CM | POA: Diagnosis not present

## 2020-07-17 DIAGNOSIS — Z992 Dependence on renal dialysis: Secondary | ICD-10-CM | POA: Diagnosis not present

## 2020-07-17 DIAGNOSIS — E8770 Fluid overload, unspecified: Secondary | ICD-10-CM | POA: Diagnosis not present

## 2020-07-17 DIAGNOSIS — N2581 Secondary hyperparathyroidism of renal origin: Secondary | ICD-10-CM | POA: Diagnosis not present

## 2020-07-17 DIAGNOSIS — N186 End stage renal disease: Secondary | ICD-10-CM | POA: Diagnosis not present

## 2020-07-17 DIAGNOSIS — Z4931 Encounter for adequacy testing for hemodialysis: Secondary | ICD-10-CM | POA: Diagnosis not present

## 2020-07-17 DIAGNOSIS — R82998 Other abnormal findings in urine: Secondary | ICD-10-CM | POA: Diagnosis not present

## 2020-07-17 DIAGNOSIS — E440 Moderate protein-calorie malnutrition: Secondary | ICD-10-CM | POA: Diagnosis not present

## 2020-07-17 DIAGNOSIS — Z79899 Other long term (current) drug therapy: Secondary | ICD-10-CM | POA: Diagnosis not present

## 2020-07-17 DIAGNOSIS — D509 Iron deficiency anemia, unspecified: Secondary | ICD-10-CM | POA: Diagnosis not present

## 2020-07-17 DIAGNOSIS — D631 Anemia in chronic kidney disease: Secondary | ICD-10-CM | POA: Diagnosis not present

## 2020-07-17 DIAGNOSIS — E876 Hypokalemia: Secondary | ICD-10-CM | POA: Diagnosis not present

## 2020-07-19 DIAGNOSIS — Z4931 Encounter for adequacy testing for hemodialysis: Secondary | ICD-10-CM | POA: Diagnosis not present

## 2020-07-19 DIAGNOSIS — R82998 Other abnormal findings in urine: Secondary | ICD-10-CM | POA: Diagnosis not present

## 2020-07-19 DIAGNOSIS — D631 Anemia in chronic kidney disease: Secondary | ICD-10-CM | POA: Diagnosis not present

## 2020-07-19 DIAGNOSIS — E876 Hypokalemia: Secondary | ICD-10-CM | POA: Diagnosis not present

## 2020-07-19 DIAGNOSIS — N2581 Secondary hyperparathyroidism of renal origin: Secondary | ICD-10-CM | POA: Diagnosis not present

## 2020-07-19 DIAGNOSIS — D509 Iron deficiency anemia, unspecified: Secondary | ICD-10-CM | POA: Diagnosis not present

## 2020-07-19 DIAGNOSIS — E440 Moderate protein-calorie malnutrition: Secondary | ICD-10-CM | POA: Diagnosis not present

## 2020-07-19 DIAGNOSIS — K7689 Other specified diseases of liver: Secondary | ICD-10-CM | POA: Diagnosis not present

## 2020-07-19 DIAGNOSIS — Z79899 Other long term (current) drug therapy: Secondary | ICD-10-CM | POA: Diagnosis not present

## 2020-07-19 DIAGNOSIS — N186 End stage renal disease: Secondary | ICD-10-CM | POA: Diagnosis not present

## 2020-07-19 DIAGNOSIS — Z992 Dependence on renal dialysis: Secondary | ICD-10-CM | POA: Diagnosis not present

## 2020-07-19 DIAGNOSIS — E8770 Fluid overload, unspecified: Secondary | ICD-10-CM | POA: Diagnosis not present

## 2020-07-20 DIAGNOSIS — E876 Hypokalemia: Secondary | ICD-10-CM | POA: Diagnosis not present

## 2020-07-20 DIAGNOSIS — Z4931 Encounter for adequacy testing for hemodialysis: Secondary | ICD-10-CM | POA: Diagnosis not present

## 2020-07-20 DIAGNOSIS — D509 Iron deficiency anemia, unspecified: Secondary | ICD-10-CM | POA: Diagnosis not present

## 2020-07-20 DIAGNOSIS — N2581 Secondary hyperparathyroidism of renal origin: Secondary | ICD-10-CM | POA: Diagnosis not present

## 2020-07-20 DIAGNOSIS — K7689 Other specified diseases of liver: Secondary | ICD-10-CM | POA: Diagnosis not present

## 2020-07-20 DIAGNOSIS — E440 Moderate protein-calorie malnutrition: Secondary | ICD-10-CM | POA: Diagnosis not present

## 2020-07-20 DIAGNOSIS — D631 Anemia in chronic kidney disease: Secondary | ICD-10-CM | POA: Diagnosis not present

## 2020-07-20 DIAGNOSIS — E8770 Fluid overload, unspecified: Secondary | ICD-10-CM | POA: Diagnosis not present

## 2020-07-20 DIAGNOSIS — R82998 Other abnormal findings in urine: Secondary | ICD-10-CM | POA: Diagnosis not present

## 2020-07-20 DIAGNOSIS — Z992 Dependence on renal dialysis: Secondary | ICD-10-CM | POA: Diagnosis not present

## 2020-07-20 DIAGNOSIS — N186 End stage renal disease: Secondary | ICD-10-CM | POA: Diagnosis not present

## 2020-07-20 DIAGNOSIS — Z79899 Other long term (current) drug therapy: Secondary | ICD-10-CM | POA: Diagnosis not present

## 2020-07-23 DIAGNOSIS — K7689 Other specified diseases of liver: Secondary | ICD-10-CM | POA: Diagnosis not present

## 2020-07-23 DIAGNOSIS — E876 Hypokalemia: Secondary | ICD-10-CM | POA: Diagnosis not present

## 2020-07-23 DIAGNOSIS — N2581 Secondary hyperparathyroidism of renal origin: Secondary | ICD-10-CM | POA: Diagnosis not present

## 2020-07-23 DIAGNOSIS — R82998 Other abnormal findings in urine: Secondary | ICD-10-CM | POA: Diagnosis not present

## 2020-07-23 DIAGNOSIS — D509 Iron deficiency anemia, unspecified: Secondary | ICD-10-CM | POA: Diagnosis not present

## 2020-07-23 DIAGNOSIS — Z992 Dependence on renal dialysis: Secondary | ICD-10-CM | POA: Diagnosis not present

## 2020-07-23 DIAGNOSIS — Z4931 Encounter for adequacy testing for hemodialysis: Secondary | ICD-10-CM | POA: Diagnosis not present

## 2020-07-23 DIAGNOSIS — E8770 Fluid overload, unspecified: Secondary | ICD-10-CM | POA: Diagnosis not present

## 2020-07-23 DIAGNOSIS — Z79899 Other long term (current) drug therapy: Secondary | ICD-10-CM | POA: Diagnosis not present

## 2020-07-23 DIAGNOSIS — E440 Moderate protein-calorie malnutrition: Secondary | ICD-10-CM | POA: Diagnosis not present

## 2020-07-23 DIAGNOSIS — D631 Anemia in chronic kidney disease: Secondary | ICD-10-CM | POA: Diagnosis not present

## 2020-07-23 DIAGNOSIS — N186 End stage renal disease: Secondary | ICD-10-CM | POA: Diagnosis not present

## 2020-07-24 DIAGNOSIS — N186 End stage renal disease: Secondary | ICD-10-CM | POA: Diagnosis not present

## 2020-07-24 DIAGNOSIS — Z992 Dependence on renal dialysis: Secondary | ICD-10-CM | POA: Diagnosis not present

## 2020-07-24 DIAGNOSIS — Z79899 Other long term (current) drug therapy: Secondary | ICD-10-CM | POA: Diagnosis not present

## 2020-07-24 DIAGNOSIS — Z4931 Encounter for adequacy testing for hemodialysis: Secondary | ICD-10-CM | POA: Diagnosis not present

## 2020-07-24 DIAGNOSIS — K7689 Other specified diseases of liver: Secondary | ICD-10-CM | POA: Diagnosis not present

## 2020-07-24 DIAGNOSIS — E440 Moderate protein-calorie malnutrition: Secondary | ICD-10-CM | POA: Diagnosis not present

## 2020-07-24 DIAGNOSIS — N2581 Secondary hyperparathyroidism of renal origin: Secondary | ICD-10-CM | POA: Diagnosis not present

## 2020-07-24 DIAGNOSIS — E876 Hypokalemia: Secondary | ICD-10-CM | POA: Diagnosis not present

## 2020-07-24 DIAGNOSIS — D509 Iron deficiency anemia, unspecified: Secondary | ICD-10-CM | POA: Diagnosis not present

## 2020-07-24 DIAGNOSIS — E8770 Fluid overload, unspecified: Secondary | ICD-10-CM | POA: Diagnosis not present

## 2020-07-24 DIAGNOSIS — R82998 Other abnormal findings in urine: Secondary | ICD-10-CM | POA: Diagnosis not present

## 2020-07-24 DIAGNOSIS — D631 Anemia in chronic kidney disease: Secondary | ICD-10-CM | POA: Diagnosis not present

## 2020-07-26 DIAGNOSIS — E876 Hypokalemia: Secondary | ICD-10-CM | POA: Diagnosis not present

## 2020-07-26 DIAGNOSIS — E440 Moderate protein-calorie malnutrition: Secondary | ICD-10-CM | POA: Diagnosis not present

## 2020-07-26 DIAGNOSIS — D631 Anemia in chronic kidney disease: Secondary | ICD-10-CM | POA: Diagnosis not present

## 2020-07-26 DIAGNOSIS — K7689 Other specified diseases of liver: Secondary | ICD-10-CM | POA: Diagnosis not present

## 2020-07-26 DIAGNOSIS — Z4931 Encounter for adequacy testing for hemodialysis: Secondary | ICD-10-CM | POA: Diagnosis not present

## 2020-07-26 DIAGNOSIS — Z992 Dependence on renal dialysis: Secondary | ICD-10-CM | POA: Diagnosis not present

## 2020-07-26 DIAGNOSIS — R82998 Other abnormal findings in urine: Secondary | ICD-10-CM | POA: Diagnosis not present

## 2020-07-26 DIAGNOSIS — E8770 Fluid overload, unspecified: Secondary | ICD-10-CM | POA: Diagnosis not present

## 2020-07-26 DIAGNOSIS — N186 End stage renal disease: Secondary | ICD-10-CM | POA: Diagnosis not present

## 2020-07-26 DIAGNOSIS — Z79899 Other long term (current) drug therapy: Secondary | ICD-10-CM | POA: Diagnosis not present

## 2020-07-26 DIAGNOSIS — I129 Hypertensive chronic kidney disease with stage 1 through stage 4 chronic kidney disease, or unspecified chronic kidney disease: Secondary | ICD-10-CM | POA: Diagnosis not present

## 2020-07-26 DIAGNOSIS — D509 Iron deficiency anemia, unspecified: Secondary | ICD-10-CM | POA: Diagnosis not present

## 2020-07-26 DIAGNOSIS — N2581 Secondary hyperparathyroidism of renal origin: Secondary | ICD-10-CM | POA: Diagnosis not present

## 2020-07-27 DIAGNOSIS — E876 Hypokalemia: Secondary | ICD-10-CM | POA: Diagnosis not present

## 2020-07-27 DIAGNOSIS — E440 Moderate protein-calorie malnutrition: Secondary | ICD-10-CM | POA: Diagnosis not present

## 2020-07-27 DIAGNOSIS — E8770 Fluid overload, unspecified: Secondary | ICD-10-CM | POA: Diagnosis not present

## 2020-07-27 DIAGNOSIS — Z4931 Encounter for adequacy testing for hemodialysis: Secondary | ICD-10-CM | POA: Diagnosis not present

## 2020-07-27 DIAGNOSIS — R82998 Other abnormal findings in urine: Secondary | ICD-10-CM | POA: Diagnosis not present

## 2020-07-27 DIAGNOSIS — N186 End stage renal disease: Secondary | ICD-10-CM | POA: Diagnosis not present

## 2020-07-27 DIAGNOSIS — Z79899 Other long term (current) drug therapy: Secondary | ICD-10-CM | POA: Diagnosis not present

## 2020-07-27 DIAGNOSIS — D509 Iron deficiency anemia, unspecified: Secondary | ICD-10-CM | POA: Diagnosis not present

## 2020-07-27 DIAGNOSIS — N2581 Secondary hyperparathyroidism of renal origin: Secondary | ICD-10-CM | POA: Diagnosis not present

## 2020-07-27 DIAGNOSIS — N2589 Other disorders resulting from impaired renal tubular function: Secondary | ICD-10-CM | POA: Diagnosis not present

## 2020-07-27 DIAGNOSIS — Z992 Dependence on renal dialysis: Secondary | ICD-10-CM | POA: Diagnosis not present

## 2020-07-27 DIAGNOSIS — D631 Anemia in chronic kidney disease: Secondary | ICD-10-CM | POA: Diagnosis not present

## 2020-07-28 ENCOUNTER — Other Ambulatory Visit: Payer: Self-pay | Admitting: Allergy & Immunology

## 2020-07-30 DIAGNOSIS — I871 Compression of vein: Secondary | ICD-10-CM | POA: Diagnosis not present

## 2020-07-30 DIAGNOSIS — N186 End stage renal disease: Secondary | ICD-10-CM | POA: Diagnosis not present

## 2020-07-30 DIAGNOSIS — Z79899 Other long term (current) drug therapy: Secondary | ICD-10-CM | POA: Diagnosis not present

## 2020-07-30 DIAGNOSIS — R82998 Other abnormal findings in urine: Secondary | ICD-10-CM | POA: Diagnosis not present

## 2020-07-30 DIAGNOSIS — D509 Iron deficiency anemia, unspecified: Secondary | ICD-10-CM | POA: Diagnosis not present

## 2020-07-30 DIAGNOSIS — E440 Moderate protein-calorie malnutrition: Secondary | ICD-10-CM | POA: Diagnosis not present

## 2020-07-30 DIAGNOSIS — E876 Hypokalemia: Secondary | ICD-10-CM | POA: Diagnosis not present

## 2020-07-30 DIAGNOSIS — Z992 Dependence on renal dialysis: Secondary | ICD-10-CM | POA: Diagnosis not present

## 2020-07-30 DIAGNOSIS — N2581 Secondary hyperparathyroidism of renal origin: Secondary | ICD-10-CM | POA: Diagnosis not present

## 2020-07-30 DIAGNOSIS — E8770 Fluid overload, unspecified: Secondary | ICD-10-CM | POA: Diagnosis not present

## 2020-07-30 DIAGNOSIS — T82858A Stenosis of vascular prosthetic devices, implants and grafts, initial encounter: Secondary | ICD-10-CM | POA: Diagnosis not present

## 2020-07-30 DIAGNOSIS — D631 Anemia in chronic kidney disease: Secondary | ICD-10-CM | POA: Diagnosis not present

## 2020-07-30 DIAGNOSIS — N2589 Other disorders resulting from impaired renal tubular function: Secondary | ICD-10-CM | POA: Diagnosis not present

## 2020-07-30 DIAGNOSIS — Z4931 Encounter for adequacy testing for hemodialysis: Secondary | ICD-10-CM | POA: Diagnosis not present

## 2020-07-31 DIAGNOSIS — E440 Moderate protein-calorie malnutrition: Secondary | ICD-10-CM | POA: Diagnosis not present

## 2020-07-31 DIAGNOSIS — D509 Iron deficiency anemia, unspecified: Secondary | ICD-10-CM | POA: Diagnosis not present

## 2020-07-31 DIAGNOSIS — D631 Anemia in chronic kidney disease: Secondary | ICD-10-CM | POA: Diagnosis not present

## 2020-07-31 DIAGNOSIS — Z4931 Encounter for adequacy testing for hemodialysis: Secondary | ICD-10-CM | POA: Diagnosis not present

## 2020-07-31 DIAGNOSIS — Z992 Dependence on renal dialysis: Secondary | ICD-10-CM | POA: Diagnosis not present

## 2020-07-31 DIAGNOSIS — N2589 Other disorders resulting from impaired renal tubular function: Secondary | ICD-10-CM | POA: Diagnosis not present

## 2020-07-31 DIAGNOSIS — N2581 Secondary hyperparathyroidism of renal origin: Secondary | ICD-10-CM | POA: Diagnosis not present

## 2020-07-31 DIAGNOSIS — E876 Hypokalemia: Secondary | ICD-10-CM | POA: Diagnosis not present

## 2020-07-31 DIAGNOSIS — E8770 Fluid overload, unspecified: Secondary | ICD-10-CM | POA: Diagnosis not present

## 2020-07-31 DIAGNOSIS — N186 End stage renal disease: Secondary | ICD-10-CM | POA: Diagnosis not present

## 2020-07-31 DIAGNOSIS — Z79899 Other long term (current) drug therapy: Secondary | ICD-10-CM | POA: Diagnosis not present

## 2020-07-31 DIAGNOSIS — R82998 Other abnormal findings in urine: Secondary | ICD-10-CM | POA: Diagnosis not present

## 2020-08-02 DIAGNOSIS — N2581 Secondary hyperparathyroidism of renal origin: Secondary | ICD-10-CM | POA: Diagnosis not present

## 2020-08-02 DIAGNOSIS — D509 Iron deficiency anemia, unspecified: Secondary | ICD-10-CM | POA: Diagnosis not present

## 2020-08-02 DIAGNOSIS — Z79899 Other long term (current) drug therapy: Secondary | ICD-10-CM | POA: Diagnosis not present

## 2020-08-02 DIAGNOSIS — Z4931 Encounter for adequacy testing for hemodialysis: Secondary | ICD-10-CM | POA: Diagnosis not present

## 2020-08-02 DIAGNOSIS — N2589 Other disorders resulting from impaired renal tubular function: Secondary | ICD-10-CM | POA: Diagnosis not present

## 2020-08-02 DIAGNOSIS — E440 Moderate protein-calorie malnutrition: Secondary | ICD-10-CM | POA: Diagnosis not present

## 2020-08-02 DIAGNOSIS — R82998 Other abnormal findings in urine: Secondary | ICD-10-CM | POA: Diagnosis not present

## 2020-08-02 DIAGNOSIS — E876 Hypokalemia: Secondary | ICD-10-CM | POA: Diagnosis not present

## 2020-08-02 DIAGNOSIS — Z992 Dependence on renal dialysis: Secondary | ICD-10-CM | POA: Diagnosis not present

## 2020-08-02 DIAGNOSIS — E8770 Fluid overload, unspecified: Secondary | ICD-10-CM | POA: Diagnosis not present

## 2020-08-02 DIAGNOSIS — N186 End stage renal disease: Secondary | ICD-10-CM | POA: Diagnosis not present

## 2020-08-02 DIAGNOSIS — D631 Anemia in chronic kidney disease: Secondary | ICD-10-CM | POA: Diagnosis not present

## 2020-08-03 DIAGNOSIS — E876 Hypokalemia: Secondary | ICD-10-CM | POA: Diagnosis not present

## 2020-08-03 DIAGNOSIS — Z79899 Other long term (current) drug therapy: Secondary | ICD-10-CM | POA: Diagnosis not present

## 2020-08-03 DIAGNOSIS — R82998 Other abnormal findings in urine: Secondary | ICD-10-CM | POA: Diagnosis not present

## 2020-08-03 DIAGNOSIS — N2581 Secondary hyperparathyroidism of renal origin: Secondary | ICD-10-CM | POA: Diagnosis not present

## 2020-08-03 DIAGNOSIS — D631 Anemia in chronic kidney disease: Secondary | ICD-10-CM | POA: Diagnosis not present

## 2020-08-03 DIAGNOSIS — N186 End stage renal disease: Secondary | ICD-10-CM | POA: Diagnosis not present

## 2020-08-03 DIAGNOSIS — D509 Iron deficiency anemia, unspecified: Secondary | ICD-10-CM | POA: Diagnosis not present

## 2020-08-03 DIAGNOSIS — Z4931 Encounter for adequacy testing for hemodialysis: Secondary | ICD-10-CM | POA: Diagnosis not present

## 2020-08-03 DIAGNOSIS — E8770 Fluid overload, unspecified: Secondary | ICD-10-CM | POA: Diagnosis not present

## 2020-08-03 DIAGNOSIS — N2589 Other disorders resulting from impaired renal tubular function: Secondary | ICD-10-CM | POA: Diagnosis not present

## 2020-08-03 DIAGNOSIS — Z992 Dependence on renal dialysis: Secondary | ICD-10-CM | POA: Diagnosis not present

## 2020-08-03 DIAGNOSIS — E440 Moderate protein-calorie malnutrition: Secondary | ICD-10-CM | POA: Diagnosis not present

## 2020-08-06 DIAGNOSIS — D631 Anemia in chronic kidney disease: Secondary | ICD-10-CM | POA: Diagnosis not present

## 2020-08-06 DIAGNOSIS — D509 Iron deficiency anemia, unspecified: Secondary | ICD-10-CM | POA: Diagnosis not present

## 2020-08-06 DIAGNOSIS — E440 Moderate protein-calorie malnutrition: Secondary | ICD-10-CM | POA: Diagnosis not present

## 2020-08-06 DIAGNOSIS — R82998 Other abnormal findings in urine: Secondary | ICD-10-CM | POA: Diagnosis not present

## 2020-08-06 DIAGNOSIS — N186 End stage renal disease: Secondary | ICD-10-CM | POA: Diagnosis not present

## 2020-08-06 DIAGNOSIS — N2589 Other disorders resulting from impaired renal tubular function: Secondary | ICD-10-CM | POA: Diagnosis not present

## 2020-08-06 DIAGNOSIS — N2581 Secondary hyperparathyroidism of renal origin: Secondary | ICD-10-CM | POA: Diagnosis not present

## 2020-08-06 DIAGNOSIS — Z992 Dependence on renal dialysis: Secondary | ICD-10-CM | POA: Diagnosis not present

## 2020-08-06 DIAGNOSIS — E876 Hypokalemia: Secondary | ICD-10-CM | POA: Diagnosis not present

## 2020-08-06 DIAGNOSIS — Z79899 Other long term (current) drug therapy: Secondary | ICD-10-CM | POA: Diagnosis not present

## 2020-08-06 DIAGNOSIS — Z4931 Encounter for adequacy testing for hemodialysis: Secondary | ICD-10-CM | POA: Diagnosis not present

## 2020-08-06 DIAGNOSIS — E8770 Fluid overload, unspecified: Secondary | ICD-10-CM | POA: Diagnosis not present

## 2020-08-07 DIAGNOSIS — D631 Anemia in chronic kidney disease: Secondary | ICD-10-CM | POA: Diagnosis not present

## 2020-08-07 DIAGNOSIS — E876 Hypokalemia: Secondary | ICD-10-CM | POA: Diagnosis not present

## 2020-08-07 DIAGNOSIS — E8770 Fluid overload, unspecified: Secondary | ICD-10-CM | POA: Diagnosis not present

## 2020-08-07 DIAGNOSIS — Z4931 Encounter for adequacy testing for hemodialysis: Secondary | ICD-10-CM | POA: Diagnosis not present

## 2020-08-07 DIAGNOSIS — Z79899 Other long term (current) drug therapy: Secondary | ICD-10-CM | POA: Diagnosis not present

## 2020-08-07 DIAGNOSIS — N2581 Secondary hyperparathyroidism of renal origin: Secondary | ICD-10-CM | POA: Diagnosis not present

## 2020-08-07 DIAGNOSIS — D509 Iron deficiency anemia, unspecified: Secondary | ICD-10-CM | POA: Diagnosis not present

## 2020-08-07 DIAGNOSIS — N2589 Other disorders resulting from impaired renal tubular function: Secondary | ICD-10-CM | POA: Diagnosis not present

## 2020-08-07 DIAGNOSIS — N186 End stage renal disease: Secondary | ICD-10-CM | POA: Diagnosis not present

## 2020-08-07 DIAGNOSIS — R82998 Other abnormal findings in urine: Secondary | ICD-10-CM | POA: Diagnosis not present

## 2020-08-07 DIAGNOSIS — Z992 Dependence on renal dialysis: Secondary | ICD-10-CM | POA: Diagnosis not present

## 2020-08-07 DIAGNOSIS — E440 Moderate protein-calorie malnutrition: Secondary | ICD-10-CM | POA: Diagnosis not present

## 2020-08-08 DIAGNOSIS — Z01818 Encounter for other preprocedural examination: Secondary | ICD-10-CM | POA: Diagnosis not present

## 2020-08-08 DIAGNOSIS — E876 Hypokalemia: Secondary | ICD-10-CM | POA: Diagnosis not present

## 2020-08-08 DIAGNOSIS — E8770 Fluid overload, unspecified: Secondary | ICD-10-CM | POA: Diagnosis not present

## 2020-08-08 DIAGNOSIS — N2581 Secondary hyperparathyroidism of renal origin: Secondary | ICD-10-CM | POA: Diagnosis not present

## 2020-08-08 DIAGNOSIS — N2589 Other disorders resulting from impaired renal tubular function: Secondary | ICD-10-CM | POA: Diagnosis not present

## 2020-08-08 DIAGNOSIS — Z79899 Other long term (current) drug therapy: Secondary | ICD-10-CM | POA: Diagnosis not present

## 2020-08-08 DIAGNOSIS — N186 End stage renal disease: Secondary | ICD-10-CM | POA: Diagnosis not present

## 2020-08-08 DIAGNOSIS — Z4931 Encounter for adequacy testing for hemodialysis: Secondary | ICD-10-CM | POA: Diagnosis not present

## 2020-08-08 DIAGNOSIS — Z992 Dependence on renal dialysis: Secondary | ICD-10-CM | POA: Diagnosis not present

## 2020-08-08 DIAGNOSIS — D631 Anemia in chronic kidney disease: Secondary | ICD-10-CM | POA: Diagnosis not present

## 2020-08-08 DIAGNOSIS — E440 Moderate protein-calorie malnutrition: Secondary | ICD-10-CM | POA: Diagnosis not present

## 2020-08-08 DIAGNOSIS — D509 Iron deficiency anemia, unspecified: Secondary | ICD-10-CM | POA: Diagnosis not present

## 2020-08-08 DIAGNOSIS — R82998 Other abnormal findings in urine: Secondary | ICD-10-CM | POA: Diagnosis not present

## 2020-08-09 DIAGNOSIS — N2589 Other disorders resulting from impaired renal tubular function: Secondary | ICD-10-CM | POA: Diagnosis not present

## 2020-08-09 DIAGNOSIS — Z79899 Other long term (current) drug therapy: Secondary | ICD-10-CM | POA: Diagnosis not present

## 2020-08-09 DIAGNOSIS — E876 Hypokalemia: Secondary | ICD-10-CM | POA: Diagnosis not present

## 2020-08-09 DIAGNOSIS — Z4931 Encounter for adequacy testing for hemodialysis: Secondary | ICD-10-CM | POA: Diagnosis not present

## 2020-08-09 DIAGNOSIS — R82998 Other abnormal findings in urine: Secondary | ICD-10-CM | POA: Diagnosis not present

## 2020-08-09 DIAGNOSIS — D509 Iron deficiency anemia, unspecified: Secondary | ICD-10-CM | POA: Diagnosis not present

## 2020-08-09 DIAGNOSIS — N186 End stage renal disease: Secondary | ICD-10-CM | POA: Diagnosis not present

## 2020-08-09 DIAGNOSIS — E440 Moderate protein-calorie malnutrition: Secondary | ICD-10-CM | POA: Diagnosis not present

## 2020-08-09 DIAGNOSIS — D631 Anemia in chronic kidney disease: Secondary | ICD-10-CM | POA: Diagnosis not present

## 2020-08-09 DIAGNOSIS — E8770 Fluid overload, unspecified: Secondary | ICD-10-CM | POA: Diagnosis not present

## 2020-08-09 DIAGNOSIS — Z992 Dependence on renal dialysis: Secondary | ICD-10-CM | POA: Diagnosis not present

## 2020-08-09 DIAGNOSIS — N2581 Secondary hyperparathyroidism of renal origin: Secondary | ICD-10-CM | POA: Diagnosis not present

## 2020-08-10 ENCOUNTER — Other Ambulatory Visit: Payer: Self-pay | Admitting: Nephrology

## 2020-08-10 DIAGNOSIS — E876 Hypokalemia: Secondary | ICD-10-CM | POA: Diagnosis not present

## 2020-08-10 DIAGNOSIS — N2589 Other disorders resulting from impaired renal tubular function: Secondary | ICD-10-CM | POA: Diagnosis not present

## 2020-08-10 DIAGNOSIS — R82998 Other abnormal findings in urine: Secondary | ICD-10-CM | POA: Diagnosis not present

## 2020-08-10 DIAGNOSIS — E440 Moderate protein-calorie malnutrition: Secondary | ICD-10-CM | POA: Diagnosis not present

## 2020-08-10 DIAGNOSIS — E8770 Fluid overload, unspecified: Secondary | ICD-10-CM | POA: Diagnosis not present

## 2020-08-10 DIAGNOSIS — Z992 Dependence on renal dialysis: Secondary | ICD-10-CM | POA: Diagnosis not present

## 2020-08-10 DIAGNOSIS — D509 Iron deficiency anemia, unspecified: Secondary | ICD-10-CM | POA: Diagnosis not present

## 2020-08-10 DIAGNOSIS — N2581 Secondary hyperparathyroidism of renal origin: Secondary | ICD-10-CM | POA: Diagnosis not present

## 2020-08-10 DIAGNOSIS — D631 Anemia in chronic kidney disease: Secondary | ICD-10-CM | POA: Diagnosis not present

## 2020-08-10 DIAGNOSIS — N281 Cyst of kidney, acquired: Secondary | ICD-10-CM

## 2020-08-10 DIAGNOSIS — Z4931 Encounter for adequacy testing for hemodialysis: Secondary | ICD-10-CM | POA: Diagnosis not present

## 2020-08-10 DIAGNOSIS — Z79899 Other long term (current) drug therapy: Secondary | ICD-10-CM | POA: Diagnosis not present

## 2020-08-10 DIAGNOSIS — N186 End stage renal disease: Secondary | ICD-10-CM | POA: Diagnosis not present

## 2020-08-11 ENCOUNTER — Other Ambulatory Visit: Payer: Self-pay | Admitting: Allergy & Immunology

## 2020-08-12 ENCOUNTER — Other Ambulatory Visit: Payer: Self-pay | Admitting: Cardiovascular Disease

## 2020-08-12 DIAGNOSIS — I5032 Chronic diastolic (congestive) heart failure: Secondary | ICD-10-CM

## 2020-08-13 ENCOUNTER — Telehealth: Payer: Self-pay | Admitting: Gastroenterology

## 2020-08-13 ENCOUNTER — Encounter (HOSPITAL_COMMUNITY): Payer: Self-pay

## 2020-08-13 ENCOUNTER — Other Ambulatory Visit: Payer: Medicare Other

## 2020-08-13 ENCOUNTER — Emergency Department (HOSPITAL_COMMUNITY)
Admission: EM | Admit: 2020-08-13 | Discharge: 2020-08-14 | Disposition: A | Discharge status: Home / Self Care | Payer: Medicare Other | Attending: Emergency Medicine | Admitting: Emergency Medicine

## 2020-08-13 DIAGNOSIS — R102 Pelvic and perineal pain: Secondary | ICD-10-CM

## 2020-08-13 DIAGNOSIS — R197 Diarrhea, unspecified: Secondary | ICD-10-CM | POA: Diagnosis not present

## 2020-08-13 DIAGNOSIS — R112 Nausea with vomiting, unspecified: Secondary | ICD-10-CM | POA: Diagnosis not present

## 2020-08-13 DIAGNOSIS — R109 Unspecified abdominal pain: Secondary | ICD-10-CM | POA: Diagnosis not present

## 2020-08-13 DIAGNOSIS — Z992 Dependence on renal dialysis: Secondary | ICD-10-CM | POA: Diagnosis not present

## 2020-08-13 DIAGNOSIS — I10 Essential (primary) hypertension: Secondary | ICD-10-CM | POA: Diagnosis not present

## 2020-08-13 DIAGNOSIS — R1030 Lower abdominal pain, unspecified: Secondary | ICD-10-CM | POA: Diagnosis not present

## 2020-08-13 DIAGNOSIS — D631 Anemia in chronic kidney disease: Secondary | ICD-10-CM | POA: Diagnosis not present

## 2020-08-13 DIAGNOSIS — J45909 Unspecified asthma, uncomplicated: Secondary | ICD-10-CM | POA: Diagnosis not present

## 2020-08-13 DIAGNOSIS — D509 Iron deficiency anemia, unspecified: Secondary | ICD-10-CM | POA: Diagnosis not present

## 2020-08-13 DIAGNOSIS — E876 Hypokalemia: Secondary | ICD-10-CM | POA: Diagnosis not present

## 2020-08-13 DIAGNOSIS — Z7951 Long term (current) use of inhaled steroids: Secondary | ICD-10-CM | POA: Diagnosis not present

## 2020-08-13 DIAGNOSIS — E039 Hypothyroidism, unspecified: Secondary | ICD-10-CM | POA: Insufficient documentation

## 2020-08-13 DIAGNOSIS — Z743 Need for continuous supervision: Secondary | ICD-10-CM | POA: Diagnosis not present

## 2020-08-13 DIAGNOSIS — N2581 Secondary hyperparathyroidism of renal origin: Secondary | ICD-10-CM | POA: Diagnosis not present

## 2020-08-13 DIAGNOSIS — Z87891 Personal history of nicotine dependence: Secondary | ICD-10-CM | POA: Diagnosis not present

## 2020-08-13 DIAGNOSIS — N2589 Other disorders resulting from impaired renal tubular function: Secondary | ICD-10-CM | POA: Diagnosis not present

## 2020-08-13 DIAGNOSIS — Z79899 Other long term (current) drug therapy: Secondary | ICD-10-CM | POA: Diagnosis not present

## 2020-08-13 DIAGNOSIS — I5032 Chronic diastolic (congestive) heart failure: Secondary | ICD-10-CM | POA: Insufficient documentation

## 2020-08-13 DIAGNOSIS — R0902 Hypoxemia: Secondary | ICD-10-CM | POA: Diagnosis not present

## 2020-08-13 DIAGNOSIS — I132 Hypertensive heart and chronic kidney disease with heart failure and with stage 5 chronic kidney disease, or end stage renal disease: Secondary | ICD-10-CM | POA: Diagnosis not present

## 2020-08-13 DIAGNOSIS — N186 End stage renal disease: Secondary | ICD-10-CM | POA: Insufficient documentation

## 2020-08-13 DIAGNOSIS — R82998 Other abnormal findings in urine: Secondary | ICD-10-CM | POA: Diagnosis not present

## 2020-08-13 DIAGNOSIS — K4090 Unilateral inguinal hernia, without obstruction or gangrene, not specified as recurrent: Secondary | ICD-10-CM | POA: Diagnosis not present

## 2020-08-13 DIAGNOSIS — E440 Moderate protein-calorie malnutrition: Secondary | ICD-10-CM | POA: Diagnosis not present

## 2020-08-13 DIAGNOSIS — E8770 Fluid overload, unspecified: Secondary | ICD-10-CM | POA: Diagnosis not present

## 2020-08-13 DIAGNOSIS — Z4931 Encounter for adequacy testing for hemodialysis: Secondary | ICD-10-CM | POA: Diagnosis not present

## 2020-08-13 LAB — CBC
HCT: 28.1 % — ABNORMAL LOW (ref 39.0–52.0)
Hemoglobin: 9.6 g/dL — ABNORMAL LOW (ref 13.0–17.0)
MCH: 35.3 pg — ABNORMAL HIGH (ref 26.0–34.0)
MCHC: 34.2 g/dL (ref 30.0–36.0)
MCV: 103.3 fL — ABNORMAL HIGH (ref 80.0–100.0)
Platelets: 168 10*3/uL (ref 150–400)
RBC: 2.72 MIL/uL — ABNORMAL LOW (ref 4.22–5.81)
RDW: 12.9 % (ref 11.5–15.5)
WBC: 7.4 10*3/uL (ref 4.0–10.5)
nRBC: 0 % (ref 0.0–0.2)

## 2020-08-13 LAB — COMPREHENSIVE METABOLIC PANEL
ALT: 16 U/L (ref 0–44)
AST: 14 U/L — ABNORMAL LOW (ref 15–41)
Albumin: 3.9 g/dL (ref 3.5–5.0)
Alkaline Phosphatase: 51 U/L (ref 38–126)
Anion gap: 15 (ref 5–15)
BUN: 43 mg/dL — ABNORMAL HIGH (ref 6–20)
CO2: 22 mmol/L (ref 22–32)
Calcium: 9.6 mg/dL (ref 8.9–10.3)
Chloride: 103 mmol/L (ref 98–111)
Creatinine, Ser: 7.1 mg/dL — ABNORMAL HIGH (ref 0.61–1.24)
GFR, Estimated: 8 mL/min — ABNORMAL LOW (ref >60)
Glucose, Bld: 109 mg/dL — ABNORMAL HIGH (ref 70–99)
Potassium: 3.1 mmol/L — ABNORMAL LOW (ref 3.5–5.1)
Sodium: 140 mmol/L (ref 135–145)
Total Bilirubin: 0.7 mg/dL (ref 0.3–1.2)
Total Protein: 7 g/dL (ref 6.5–8.1)

## 2020-08-13 LAB — LIPASE, BLOOD: Lipase: 283 U/L — ABNORMAL HIGH (ref 11–51)

## 2020-08-13 MED ORDER — IOHEXOL 9 MG/ML PO SOLN: 500.0000 mL | ORAL | Status: AC

## 2020-08-13 MED ORDER — IOHEXOL 9 MG/ML PO SOLN
ORAL | Status: AC
  Filled 2020-08-13: qty 1000

## 2020-08-13 NOTE — Telephone Encounter (Signed)
The pt [place] currently in the ED for abd pain and has an appt tomorrow with Anderson Malta. He will keep that appt as long as he is not admitted.

## 2020-08-13 NOTE — ED Triage Notes (Signed)
Pt reports lower abd pain for a month but worse last night with diarrhea. Pt denies blood in stool. Dialysis pt, does HD at home. Last dialysis was Friday. Pt a.o, given 50 mcg fentanyl by EMS

## 2020-08-13 NOTE — Telephone Encounter (Signed)
Patient is requesting a call to get advise, he is having chronic lower abdominal pain and is seeking help

## 2020-08-14 ENCOUNTER — Encounter: Payer: Self-pay | Admitting: Physician Assistant

## 2020-08-14 ENCOUNTER — Emergency Department (HOSPITAL_COMMUNITY): Payer: Medicare Other

## 2020-08-14 ENCOUNTER — Ambulatory Visit (INDEPENDENT_AMBULATORY_CARE_PROVIDER_SITE_OTHER): Payer: Medicare Other | Admitting: Physician Assistant

## 2020-08-14 VITALS — BP 142/86 | HR 86 | Ht 70.0 in | Wt 206.0 lb

## 2020-08-14 DIAGNOSIS — R82998 Other abnormal findings in urine: Secondary | ICD-10-CM | POA: Diagnosis not present

## 2020-08-14 DIAGNOSIS — R102 Pelvic and perineal pain: Secondary | ICD-10-CM

## 2020-08-14 DIAGNOSIS — E876 Hypokalemia: Secondary | ICD-10-CM | POA: Diagnosis not present

## 2020-08-14 DIAGNOSIS — Z79899 Other long term (current) drug therapy: Secondary | ICD-10-CM | POA: Diagnosis not present

## 2020-08-14 DIAGNOSIS — D509 Iron deficiency anemia, unspecified: Secondary | ICD-10-CM | POA: Diagnosis not present

## 2020-08-14 DIAGNOSIS — N2581 Secondary hyperparathyroidism of renal origin: Secondary | ICD-10-CM | POA: Diagnosis not present

## 2020-08-14 DIAGNOSIS — D631 Anemia in chronic kidney disease: Secondary | ICD-10-CM | POA: Diagnosis not present

## 2020-08-14 DIAGNOSIS — K4090 Unilateral inguinal hernia, without obstruction or gangrene, not specified as recurrent: Secondary | ICD-10-CM | POA: Diagnosis not present

## 2020-08-14 DIAGNOSIS — E440 Moderate protein-calorie malnutrition: Secondary | ICD-10-CM | POA: Diagnosis not present

## 2020-08-14 DIAGNOSIS — R197 Diarrhea, unspecified: Secondary | ICD-10-CM | POA: Diagnosis not present

## 2020-08-14 DIAGNOSIS — E8770 Fluid overload, unspecified: Secondary | ICD-10-CM | POA: Diagnosis not present

## 2020-08-14 DIAGNOSIS — N186 End stage renal disease: Secondary | ICD-10-CM | POA: Diagnosis not present

## 2020-08-14 DIAGNOSIS — N2589 Other disorders resulting from impaired renal tubular function: Secondary | ICD-10-CM | POA: Diagnosis not present

## 2020-08-14 DIAGNOSIS — R300 Dysuria: Secondary | ICD-10-CM | POA: Diagnosis not present

## 2020-08-14 DIAGNOSIS — Z992 Dependence on renal dialysis: Secondary | ICD-10-CM | POA: Diagnosis not present

## 2020-08-14 DIAGNOSIS — Z4931 Encounter for adequacy testing for hemodialysis: Secondary | ICD-10-CM | POA: Diagnosis not present

## 2020-08-14 DIAGNOSIS — K5900 Constipation, unspecified: Secondary | ICD-10-CM

## 2020-08-14 LAB — URINALYSIS, ROUTINE W REFLEX MICROSCOPIC
Bacteria, UA: NONE SEEN
Bilirubin Urine: NEGATIVE
Glucose, UA: NEGATIVE mg/dL
Hgb urine dipstick: NEGATIVE
Ketones, ur: NEGATIVE mg/dL
Leukocytes,Ua: NEGATIVE
Nitrite: NEGATIVE
Protein, ur: >=300 mg/dL — AB
Specific Gravity, Urine: 1.013 (ref 1.005–1.030)
pH: 5 (ref 5.0–8.0)

## 2020-08-14 IMAGING — CT CT ABD-PELV W/O CM
2 of 4 series · 17 of 46 positions shown, 19 images · non-contrast
Comparison: CT Abdomen and Pelvis [DATE] and earlier.

CLINICAL DATA: 54-year-old male with lower abdominal pain with
diarrhea for 1 month. On dialysis. HIV, hepatitis B.

EXAM:
CT ABDOMEN AND PELVIS WITHOUT CONTRAST
TECHNIQUE: Multidetector CT imaging of the abdomen and pelvis was performed
following the standard protocol without IV contrast. Oral contrast
was administered.

[Series 3: a/p w/o 5mm · axial · non-contrast · 0.82mm/px · z∈[+873,+1273]mm · 14 of 88 slices shown, 16 images]
[im 4/88  soft-tissue]
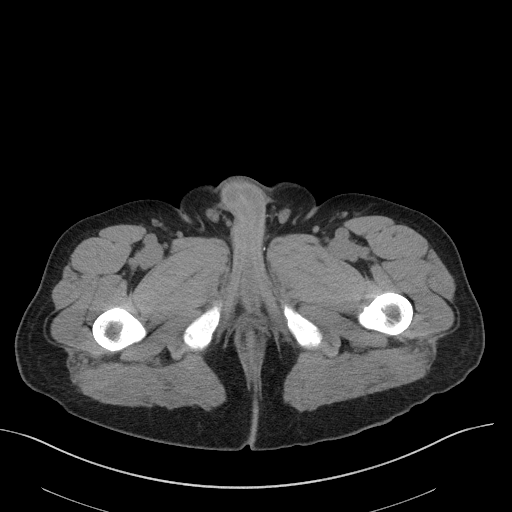
[im 4/88  bone]
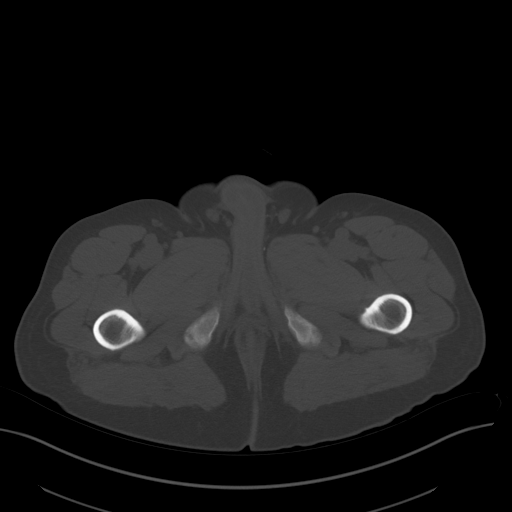
[im 11/88  soft-tissue]
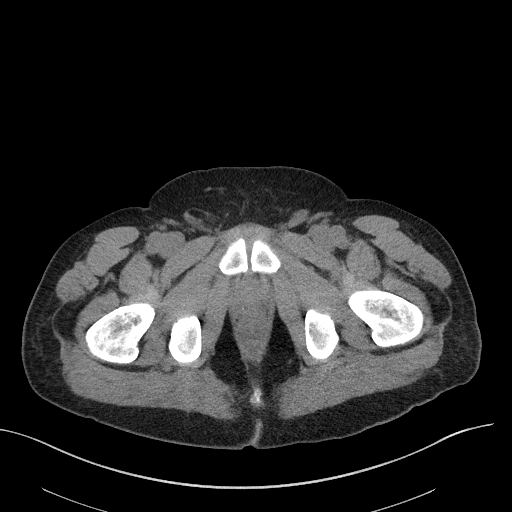
[im 17/88  soft-tissue]
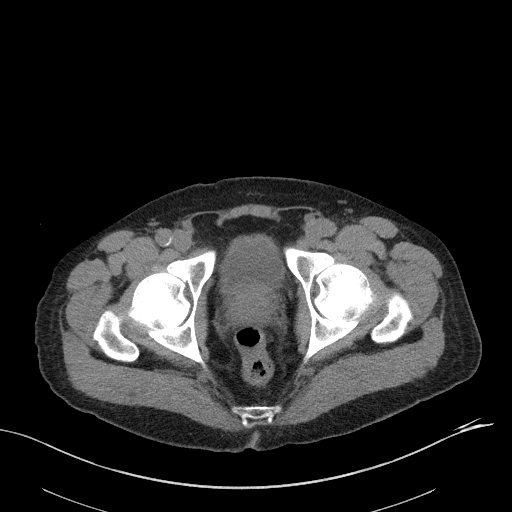
[im 24/88  soft-tissue]
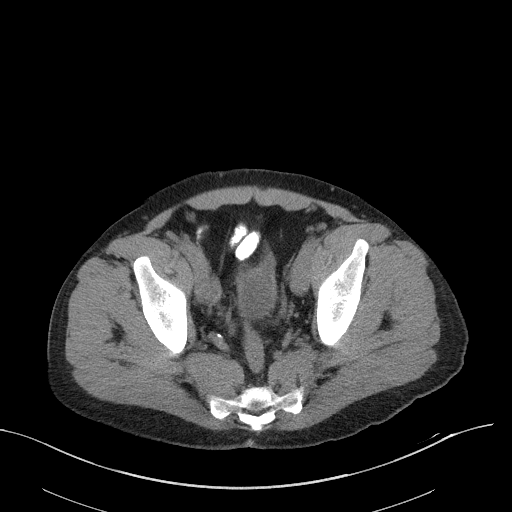
[im 31/88  soft-tissue]
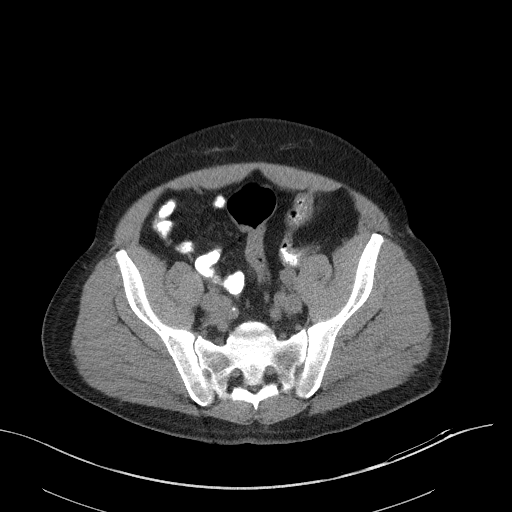
[im 34/88  soft-tissue]
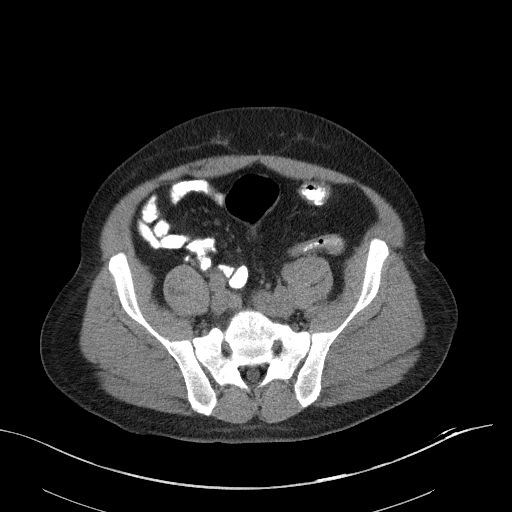
[im 41/88  soft-tissue]
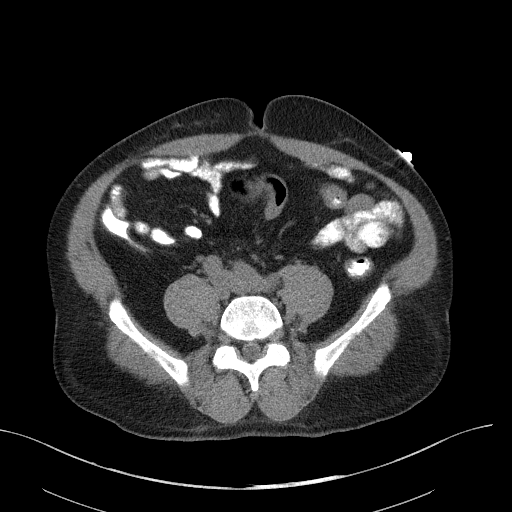
[im 47/88  soft-tissue]
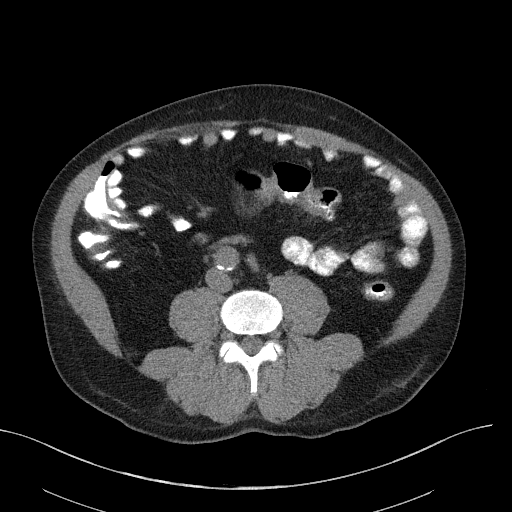
[im 54/88  soft-tissue]
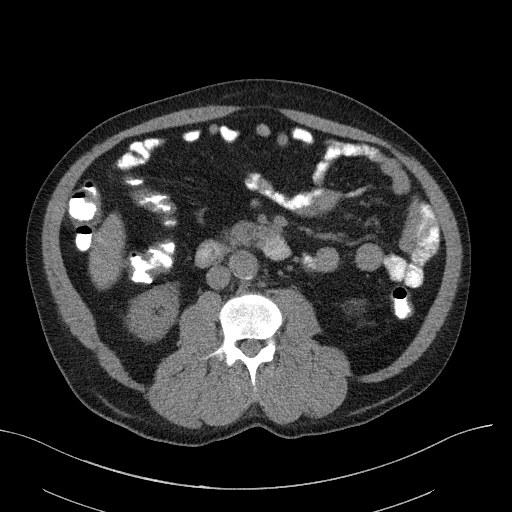
[im 54/88  bone]
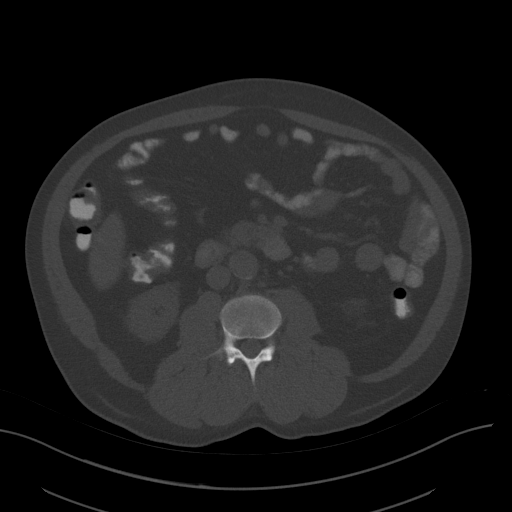
[im 57/88  soft-tissue]
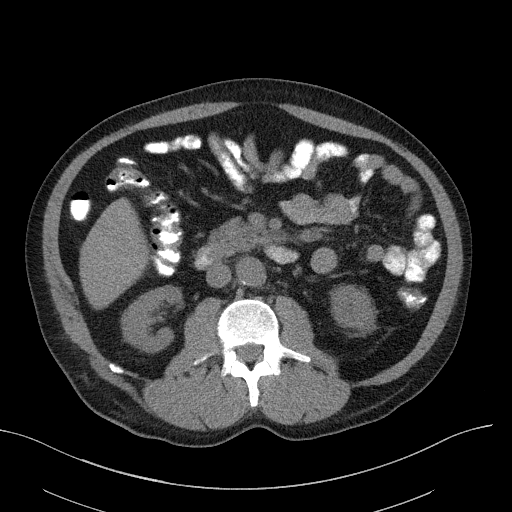
[im 64/88  soft-tissue]
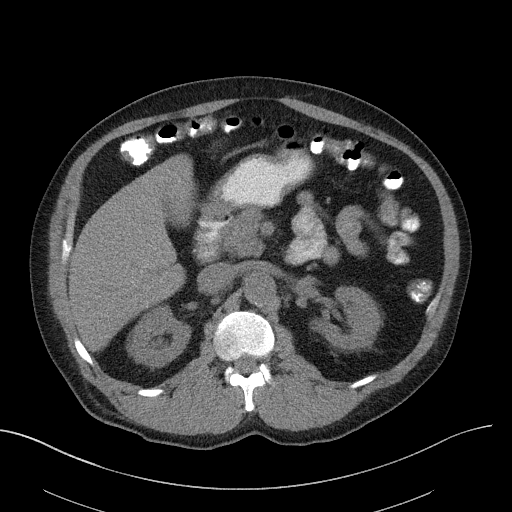
[im 71/88  soft-tissue]
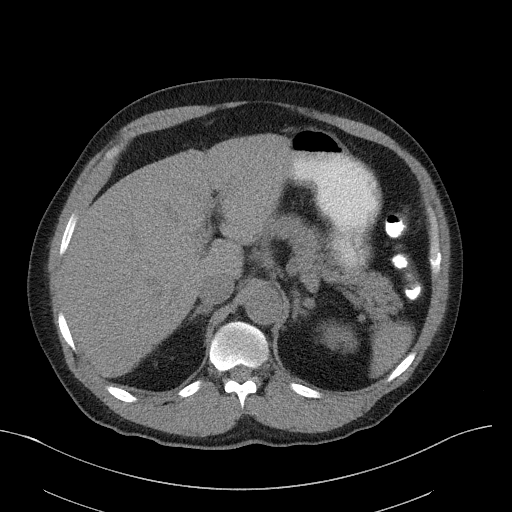
[im 77/88  soft-tissue]
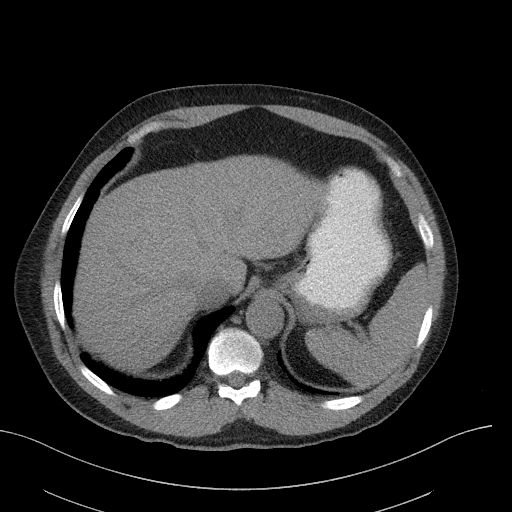
[im 84/88  soft-tissue]
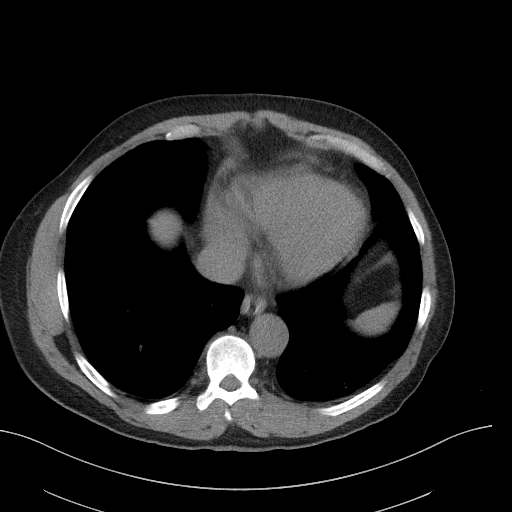

[Series 6: a/p w/o cor · coronal · non-contrast · 0.80mm/px · 3 of 151 slices shown]
[im 51/151  soft-tissue]
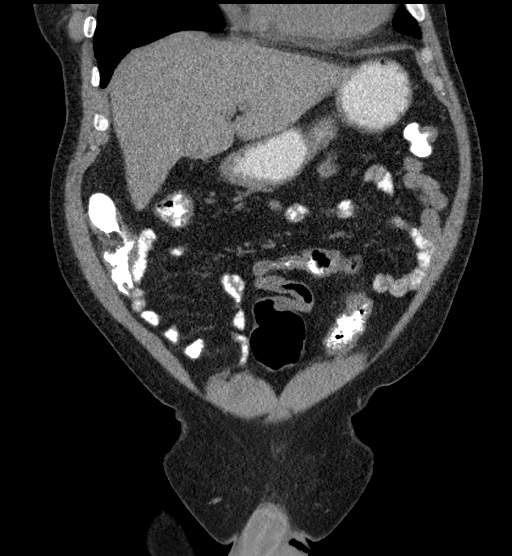
[im 67/151  soft-tissue]
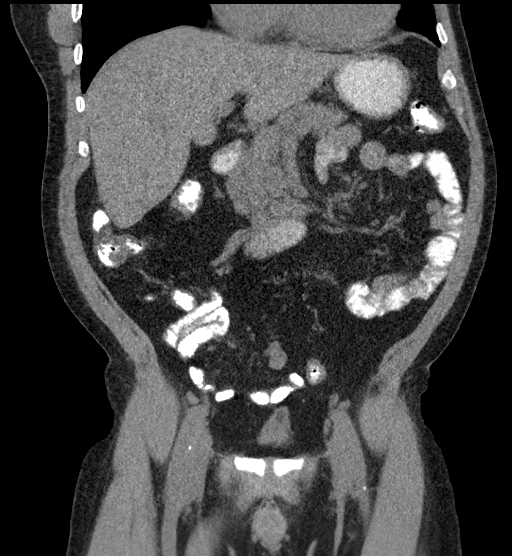
[im 84/151  soft-tissue]
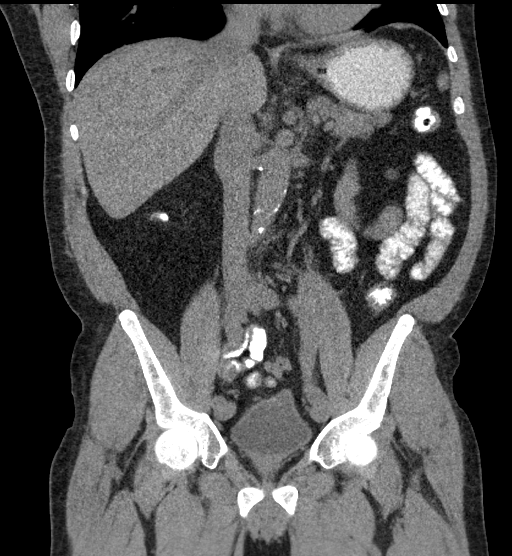

[17 of 46 positions shown; findings below may reference images not displayed]

FINDINGS: Lower chest: Stable mild cardiomegaly. No pericardial or pleural
effusion. Mild lung base atelectasis.

Hepatobiliary: Absent gallbladder as before. Negative noncontrast
liver.

Pancreas: Negative.

Spleen: Negative.

Adrenals/Urinary Tract: Normal adrenal glands.

Stable kidneys. No hydronephrosis. No hydroureter. Diminutive and
unremarkable urinary bladder. Chronic pelvic phleboliths.

Stomach/Bowel: Oral contrast administered and has reached the
sigmoid colon without evidence of obstruction. Mildly redundant
sigmoid. No large bowel inflammation. Redundant right colon. Normal
appendix (series 3, image 49). No dilated or abnormal small bowel
loops identified. Unremarkable stomach, duodenum. No free air, free
fluid, mesenteric stranding.

Vascular/Lymphatic: Vascular patency is not evaluated in the absence
of IV contrast. Calcified aortic atherosclerosis. Stable mild
aortoiliac tortuosity.

No lymphadenopathy.

Reproductive: Stable small fat containing inguinal hernias.

Other: No pelvic free fluid.

Musculoskeletal: No acute osseous abnormality identified.
IMPRESSION: 1. No acute or inflammatory process identified in the abdomen or
pelvis. Normal appendix.

2. Stable mild cardiomegaly.  Aortic Atherosclerosis ([JE]-[JE]).

## 2020-08-14 MED ORDER — HYDROMORPHONE HCL 1 MG/ML IJ SOLN
1.0000 mg | Freq: Once | INTRAMUSCULAR | Status: AC
  Administered 2020-08-14: 1 mg via INTRAVENOUS
  Filled 2020-08-14: qty 1

## 2020-08-14 MED ORDER — TRAMADOL HCL 50 MG PO TABS
50.0000 mg | ORAL_TABLET | Freq: Four times a day (QID) | ORAL | 0 refills | Status: DC | PRN
Start: 2020-08-14 — End: 2021-01-02

## 2020-08-14 NOTE — Discharge Instructions (Signed)
Thank you for allowing me to care for you today in the Emergency Department.   Please keep your follow-up appointment with GI this morning. It is possible that vomiting and diarrhea were due to a viral illness given that they began together and then spontaneously resolved.  You can resume your home dialysis schedule.  Return to the emergency department if you develop uncontrollable vomiting, if you have bloody vomiting or diarrhea, severe, uncontrollable abdominal pain, abdominal pain with high fevers, or other new, concerning symptoms.

## 2020-08-14 NOTE — Progress Notes (Signed)
Chief Complaint: Abdominal pain  HPI:    Albert Eaton is a 54 year old male, known to Dr. Hilarie Eaton with a past medical history as listed below including adenomatous colon polyps, history of ileitis by imaging not seen by capsular colonoscopy, history of GERD with esophagitis, CHF, HIV and ESRD on HD awaiting kidney transplant, who returns to clinic today with a continued complaint of abdominal pain.    06/26/2020 patient saw Albert Eaton and at that time was following up for abdominal pain.  He had been seen 07/2019 with complaints of lower abdominal pain thought related to constipation.  He was started on Amitiza 8 mcg twice daily with food that was eventually increased to 24 mcg twice daily with food, he continued that regimen overall is working well for him.  Apparently had no further abdominal pain about 4 weeks ago similar to his previous pain.  Mostly in his lower abdomen but radiated to both sides.  That time a CT of the abdomen pelvis with contrast was ordered and patient was told to use Levsin every 8 hours as needed.  Recommended he have a surveillance colonoscopy in February 2022.    07/11/2020 CT the abdomen pelvis with contrast did not show any cause for his symptoms.  It is also recommended that he have a follow-up MRI per his PCP in regards to some lesions in his kidneys.  At that time the patient described the Levsin is helping his pain.    08/13/2020 patient seen in the ER for squeezing lower abdominal pain for the past 3 to 4 months.  Lipase is elevated at 283, potassium low at 3.1, hemoglobin low at 9.6.  CT abdomen pelvis without contrast at that time showed no acute inflammatory process, normal appendix, stable mild cardiomegaly.  Patient was given Dilaudid and his pain improved.He described that he had been having episodes of vomiting and diarrhea simultaneously for several hours prior to coming in.    Today, the patient presents to clinic and tells me that he was really doing well for a  long time until about 4 to 5 months ago when he started again with suprapubic/lower abdominal pain which feels like someone is "squeezing/crushing my insides".  This is constant over the past 3 to 4 months rated as a 4/10 but over the past 48 hours this is increased now to a 7/10 which is worse if he stands up.  Patient also tells me a new symptom for him is that he has pain with urination initially and then will seem to be able to get all of his urine out.  This has been occurring for the past few days as well.  Tells me he continues to have regular bowel movements with the use of his Amitiza.  He has tried the Levsin 1 tab at a time and has found no relief from this at all.  It is gotten to the point where this is hindering his life and he did not go to dialysis 2 days ago.  He is due to go today and tells me his niece is going to take him after his visit with me.  Associated symptoms include some diarrhea and vomiting, patient associates this when the pain gets too severe.  He has not had any in the past 48 to 72 hours.    Denies fever, chills or weight loss.  Past Medical History:  Diagnosis Date  . Anemia   . Aortic atherosclerosis ([place])   . Asthma   .  Chronic diastolic CHF (congestive heart failure) ([place]) 01/06/2017   Echo 7/16 [facility] in [place], [name]) Mild AI, mild LAE, mild concentric LVH, EF 55, normal wall motion, mild to moderate MR, mild PI, RVSP 55 // Echo 10/09/16 ([facility]):  Moderate LVH, grade 2 diastolic dysfunction, mild MR, moderate LAE   . CKD (chronic kidney disease)    per pt, waiting on a kidney transplant in near future.  . Esophagitis   . Gout   . Hepatitis    Hep B  . History of nuclear stress test    a. Nuc study 7/16: no scar or ischemia, EF 42 // b. Nuc study 12/17: EF 48, ?small apical ischemia, Low Risk  . HIV (human immunodeficiency virus infection) ([place])   . HLD (hyperlipidemia)   . Hypertension   . Hypertensive heart disease with CHF (congestive heart  failure) ([place]) 10/09/2016  . Mitral regurgitation   . OSA (obstructive sleep apnea)   . Post-operative nausea and vomiting    1 time as a child  . Sleep apnea    uses c-pap  . Wears glasses   . Wears partial dentures     Past Surgical History:  Procedure Laterality Date  . AV FISTULA PLACEMENT Left 07/02/2018   Procedure: Creation of Left arm BRACHIOBASILIC ARTERIOVENOUS  FISTULA;  Surgeon: Marty Heck, MD;  Location: [facility];  Service: Vascular;  Laterality: Left;  . BASCILIC VEIN TRANSPOSITION Left 08/27/2018   Procedure: Left arm BRACHIOBASILIC VEIN TRANSPOSITION SECOND STAGE;  Surgeon: Marty Heck, MD;  Location: [facility];  Service: Vascular;  Laterality: Left;  . CHOLECYSTECTOMY    . COLONOSCOPY W/ BIOPSIES AND POLYPECTOMY    . GIVENS CAPSULE STUDY N/A 03/01/2018   Procedure: GIVENS CAPSULE STUDY;  Surgeon: Jerene Bears, MD;  Location: [facility];  Service: Gastroenterology;  Laterality: N/A;  . HERNIA REPAIR     As baby  . MULTIPLE TOOTH EXTRACTIONS    . NOSE SURGERY    . RENAL BIOPSY      Current Outpatient Medications  Medication Sig Dispense Refill  . abacavir (ZIAGEN) 300 MG tablet Take 1 tablet (300 mg total) by mouth daily. 30 tablet 3  . albuterol (VENTOLIN HFA) 108 (90 Base) MCG/ACT inhaler INHALE 2 PUFFS INTO THE LUNGS EVERY 4 HOURS AS NEEDED FOR WHEEZING OR SHORTNESS OF BREATH 8.5 g 1  . allopurinol (ZYLOPRIM) 100 MG tablet Take 1 tablet (100 mg total) by mouth 2 (two) times daily. 60 tablet 3  . amLODipine (NORVASC) 10 MG tablet Take 1 tablet (10 mg total) by mouth daily. 30 tablet 1  . amoxicillin (AMOXIL) 500 MG capsule SMARTSIG:4 Capsule(s) By Mouth Once    . Ascorbic Acid (VITAMIN C WITH ROSE HIPS) 1000 MG tablet Take 1,000 mg by mouth daily.    Marland Kitchen azelastine (ASTELIN) 0.1 % nasal spray Place 2 sprays into both nostrils 2 (two) times daily as needed for rhinitis. Use in each nostril as directed 30 mL 5  . B Complex-C-Zn-Folic Acid (DIALYVITE/ZINC)  TABS TK 1 T PO QPM    . calcitRIOL (ROCALTROL) 0.5 MCG capsule Take by mouth.    . carvedilol (COREG) 25 MG tablet TAKE 1 TABLET BY MOUTH TWICE DAILY WITH FOOD 60 tablet 11  . famotidine (PEPCID) 20 MG tablet TAKE 1 TABLET(20 MG) BY MOUTH TWICE DAILY 180 tablet 3  . fluticasone (FLOVENT HFA) 110 MCG/ACT inhaler Inhale 2 puffs into the lungs 2 (two) times daily. 1 Inhaler 5  . gabapentin (  NEURONTIN) 300 MG capsule TAKE 1 CAPSULE(300 MG) BY MOUTH THREE TIMES DAILY 90 capsule 3  . hydrALAZINE (APRESOLINE) 100 MG tablet TAKE 1 TABLET BY MOUTH THREE TIMES DAILY 270 tablet 3  . hyoscyamine (LEVSIN SL) 0.125 MG SL tablet Place 1-2 tablets (0.125-0.25 mg total) under the tongue every 8 (eight) hours as needed (lower abdominal pain, spasm). 90 tablet 2  . ipratropium (ATROVENT) 0.06 % nasal spray USE 2 SPRAYS IN EACH NOSTRIL 2 TO 3 TIMES DAILY AS NEEDED 15 mL 2  . lactulose (CHRONULAC) 10 GM/15ML solution Take by mouth.    . lactulose, encephalopathy, (CHRONULAC) 10 GM/15ML SOLN SMARTSIG:60 Milliliter(s) By Mouth As Directed    . lamivudine (EPIVIR) 100 MG tablet TAKE 1 TABLET(100 MG) BY MOUTH DAILY 30 tablet 3  . levothyroxine (SYNTHROID) 25 MCG tablet Take 1 tablet by mouth daily before breakfast 30 tablet 3  . lidocaine-prilocaine (EMLA) cream APPLY SMALL AMOUNT TO ACCESS SITE (AVF) 1 TO 2 HOURS BEFORE DIALYSIS. COVER WITH OCCLUSIVE DRESSING (SARAN WRAP)    . lubiprostone (AMITIZA) 24 MCG capsule TAKE 1 CAPSULE(24 MCG) BY MOUTH TWICE DAILY WITH A MEAL 60 capsule 3  . mupirocin ointment (BACTROBAN) 2 % Apply topically.    . Olopatadine HCl (PAZEO) 0.7 % SOLN Place 1 drop into both eyes daily. 2.5 mL 5  . raltegravir (ISENTRESS) 400 MG tablet Take 1 tablet (400 mg total) by mouth 2 (two) times daily. 60 tablet 3  . Spacer/Aero-Holding Chambers DEVI 1 Device by Does not apply route 2 (two) times a day. 1 each 1  . Turmeric 500 MG CAPS Take 500 mg by mouth daily.    . VELPHORO 500 MG chewable tablet Chew  500 mg by mouth 3 (three) times daily.    . Vitamin D, Ergocalciferol, (DRISDOL) 1.25 MG (50000 UNIT) CAPS capsule TAKE 1 CAPSULE BY MOUTH EVERY 7 DAYS 8 capsule 0  . vitamin E (VITAMIN E) 180 MG (400 UNITS) capsule Take 400 Units by mouth daily.    Marland Kitchen zinc gluconate 50 MG tablet Take 50 mg by mouth daily.      No current facility-administered medications for this visit.    Allergies as of 08/14/2020 - Review Complete 08/14/2020  Allergen Reaction Noted  . Ace inhibitors Cough and Other (See Comments) 05/07/2015  . Lisinopril Cough 03/18/2017    Family History  Problem Relation Age of Onset  . Heart failure Mother   . Heart attack Maternal Grandmother 53  . Hypertension Sister   . Multiple sclerosis Sister   . Hypertension Brother   . Hypertension Sister   . Scoliosis Other   . Allergic rhinitis Neg Hx   . Angioedema Neg Hx   . Asthma Neg Hx   . Eczema Neg Hx   . Immunodeficiency Neg Hx   . Urticaria Neg Hx     Social History   Socioeconomic History  . Marital status: Single    Spouse name: Not on file  . Number of children: Not on file  . Years of education: Not on file  . Highest education level: Not on file  Occupational History  . Not on file  Tobacco Use  . Smoking status: Former Smoker    Types: Cigarettes    Quit date: 2000    Years since quitting: 21.8  . Smokeless tobacco: Never Used  Vaping Use  . Vaping Use: Never used  Substance and Sexual Activity  . Alcohol use: Not Currently    Alcohol/week: 2.0 standard  drinks    Types: 2 Shots of liquor per week    Comment: occ  . Drug use: No  . Sexual activity: Never    Partners: Male    Birth control/protection: Condom    Comment: declined  condoms 07/11/20  Other Topics Concern  . Not on file  Social History Narrative  . Not on file   Social Determinants of Health   Financial Resource Strain:   . Difficulty of Paying Living Expenses: Not on file  Food Insecurity:   . Worried About Sales executive in the Last Year: Not on file  . Ran Out of Food in the Last Year: Not on file  Transportation Needs:   . Lack of Transportation (Medical): Not on file  . Lack of Transportation (Non-Medical): Not on file  Physical Activity:   . Days of Exercise per Week: Not on file  . Minutes of Exercise per Session: Not on file  Stress:   . Feeling of Stress : Not on file  Social Connections:   . Frequency of Communication with Friends and Family: Not on file  . Frequency of Social Gatherings with Friends and Family: Not on file  . Attends Religious Services: Not on file  . Active Member of Clubs or Organizations: Not on file  . Attends Archivist Meetings: Not on file  . Marital Status: Not on file  Intimate Partner Violence:   . Fear of Current or Ex-Partner: Not on file  . Emotionally Abused: Not on file  . Physically Abused: Not on file  . Sexually Abused: Not on file    Review of Systems:    Constitutional: No weight loss, fever or chills Cardiovascular: No chest pain Respiratory: No SOB  Gastrointestinal: See HPI and otherwise negative   Physical Exam:  Vital signs: BP (!) 142/86   Pulse 86   Ht 5' 10"  (1.778 m)   Wt 206 lb (93.4 kg)   BMI 29.56 kg/m   Constitutional:   Pleasant AA male appears to be in NAD, Well developed, Well nourished, alert and cooperative Respiratory: Respirations even and unlabored. Lungs clear to auscultation bilaterally.   No wheezes, crackles, or rhonchi.  Cardiovascular: Normal S1, S2. No MRG. Regular rate and rhythm. No peripheral edema, cyanosis or pallor.  Gastrointestinal:  Soft, nondistended, moderate suprapubic pain with involuntary guarding. Normal bowel sounds. No appreciable masses or hepatomegaly. Psychiatric: Demonstrates good judgement and reason without abnormal affect or behaviors.  RELEVANT LABS AND IMAGING: CBC    Component Value Date/Time   WBC 7.4 08/13/2020 1039   RBC 2.72 (L) 08/13/2020 1039   HGB 9.6 (L)  08/13/2020 1039   HGB 11.3 (L) 09/20/2018 0936   HCT 28.1 (L) 08/13/2020 1039   HCT 32.8 (L) 09/20/2018 0936   PLT 168 08/13/2020 1039   PLT 301 09/20/2018 0936   MCV 103.3 (H) 08/13/2020 1039   MCV 88 09/20/2018 0936   MCH 35.3 (H) 08/13/2020 1039   MCHC 34.2 08/13/2020 1039   RDW 12.9 08/13/2020 1039   RDW 13.3 09/20/2018 0936   LYMPHSABS 2.2 09/20/2018 0936   MONOABS 0.8 06/13/2018 1802   EOSABS 0.1 09/20/2018 0936   BASOSABS 0.0 09/20/2018 0936    CMP     Component Value Date/Time   NA 140 08/13/2020 1039   NA 141 12/27/2019 0950   K 3.1 (L) 08/13/2020 1039   CL 103 08/13/2020 1039   CO2 22 08/13/2020 1039   GLUCOSE 109 (H) 08/13/2020  1039   BUN 43 (H) 08/13/2020 1039   BUN 42 (H) 12/27/2019 0950   CREATININE 7.10 (H) 08/13/2020 1039   CREATININE 6.55 (H) 06/27/2020 0920   CALCIUM 9.6 08/13/2020 1039   PROT 7.0 08/13/2020 1039   PROT 7.5 09/20/2018 0936   ALBUMIN 3.9 08/13/2020 1039   ALBUMIN 4.7 09/20/2018 0936   AST 14 (L) 08/13/2020 1039   ALT 16 08/13/2020 1039   ALKPHOS 51 08/13/2020 1039   BILITOT 0.7 08/13/2020 1039   BILITOT <0.2 09/20/2018 0936   GFRNONAA 8 (L) 08/13/2020 1039   GFRNONAA 17 (L) 08/31/2017 0908   GFRAA 18 (L) 12/27/2019 0950   GFRAA 20 (L) 08/31/2017 0908    Assessment: 1.  Suprapubic pain: With below, concern for urologic etiology 2.  Dysuria: New symptom for the patient over the past 48 hours with increasing suprapubic pain, recent urinalysis in the ER with proteinurea, but patient has end-stage kidney disease and had not been to dialysis 3.  Constipation: Controlled on Amitiza  Plan: 1.  At this time I do not believe the patient's symptoms are related to his GI system.  He is having normal bowel movements which has caused him pain in the past and the only new symptom he has with this increase in pain is dysuria.  This is more than likely related to his urologic system.  We called around and got him an appointment 09/06/2020 with  [facility], this was their soonest appointment, but they also told us to tell the patient to call them twice a day and they would likely be able to get him in sooner if there was a cancellation.  Relayed this information to the patient and also discussed that if his pain increases at all or continues in its severity today then he needs to proceed back to the ER and likely be admitted for a quicker work-up. 2.  From a GI perspective the next thing that we can do is a colonoscopy, he is coming up due in February of next year for his surveillance anyways, this would be the only next step as he has had CTs and labs.  Discussed this with the patient, would like to get Urology's input before scheduling this. 3.  Continue Amitiza. 4.  Prescribed Tramadol 50 mg 1 tab every 4-6 hours as needed for pain #15 with no refills. 5.  Patient to follow in clinic with [place] after he sees urology.  Explained to him that she he should call and let us know. We could also discuss direct colonoscopy.  Ellouise Newer, PA-C [facility] 08/14/2020, 9:20 AM  Cc: [name], FNP

## 2020-08-14 NOTE — ED Provider Notes (Signed)
Albert Eaton   CSN: [id number] Arrival date & time: 08/13/20  1014     History Chief Complaint  Patient presents with  . Abdominal Pain    Albert Eaton Albert Eaton is a 54 y.o. male with a history of HIV, chronic diastolic CHF, ESRD on HD (M/W/F), asthma, allergic conjunctivitis, ileitis, OSA, HLD, prolonged QT who presents to the emergency department by EMS with a chief complaint of abdominal pain.  The patient reports that he has been having squeezing lower abdominal pain for the last 3 to 4 months.  He has been seen by GI for the same and pain was initially thought to be secondary to constipation and he was started on Amitiza with initial good improvement until the pain returned in August.  He is scheduled for follow-up tomorrow with GI.  Pain has remained manageable until he awoke 48 hours ago with worsening pain, which continue to gradually worsen throughout the day.  He reports the pain is worse with movement and standing.  It is nonradiating.  He was able to go to sleep yesterday, but awoke from sleep with uncontrollable pain, nausea, and countless episodes of vomiting and diarrhea.  Vomiting and diarrhea stopped around 5 AM, several hours after onset.   He makes urine and endorses urinary frequency over the last few days.  He does have a history of UTIs, but has not had infection in several years.  No hematuria, flank pain, penile discharge, penile or testicular pain, redness, or swelling, or rectal pain.   He also reports that he has had chest discomfort located under his left breast for the last few days.  He endorses a history of similar pain.  No shortness of breath, cough, leg swelling, palpitations, or URI symptoms.  He has received his influenza vaccination as well as his Covid vaccination series and a COVID-19 booster.  He was given 15 mcg of fentanyl with EMS.  No other treatment prior to arrival.  He was unable to complete dialysis  yesterday secondary to his symptoms.  The history is provided by the patient and medical records. No language interpreter was used.       Past Medical History:  Diagnosis Date  . Anemia   . Aortic atherosclerosis ([place])   . Asthma   . Chronic diastolic CHF (congestive heart failure) ([place]) 01/06/2017   Echo 7/16 [facility] in [place], [name]) Mild AI, mild LAE, mild concentric LVH, EF 55, normal wall motion, mild to moderate MR, mild PI, RVSP 55 // Echo 10/09/16 ([facility]):  Moderate LVH, grade 2 diastolic dysfunction, mild MR, moderate LAE   . CKD (chronic kidney disease)    per pt, waiting on a kidney transplant in near future.  . Esophagitis   . Gout   . Hepatitis    Hep B  . History of nuclear stress test    a. Nuc study 7/16: no scar or ischemia, EF 42 // b. Nuc study 12/17: EF 48, ?small apical ischemia, Low Risk  . HIV (human immunodeficiency virus infection) ([place])   . HLD (hyperlipidemia)   . Hypertension   . Hypertensive heart disease with CHF (congestive heart failure) ([place]) 10/09/2016  . Mitral regurgitation   . OSA (obstructive sleep apnea)   . Post-operative nausea and vomiting    1 time as a child  . Sleep apnea    uses c-pap  . Wears glasses   . Wears partial dentures     Patient Active Problem  List   Diagnosis Date Noted  . Lower abdominal pain 06/26/2020  . Pulsatile tinnitus of right ear 11/21/2019  . Kidney failure 11/21/2019  . CHF (congestive heart failure) ([place]) 11/21/2019  . Chills (without fever) 11/07/2019  . Hypercalcemia 10/17/2019  . Dependence on renal dialysis ([place]) 06/06/2019  . Coagulation defect, unspecified ([place]) 05/23/2019  . Iron deficiency anemia, unspecified 05/16/2019  . Anemia in other chronic diseases classified elsewhere 05/13/2019  . Arteriovenous fistula, acquired ([place]) 05/13/2019  . Body mass index (BMI) 28.0-28.9, adult 05/13/2019  . Crohn's disease of small intestine without complications ([place]) 44/92/0100  . Encounter for  screening for respiratory tuberculosis 05/13/2019  . Gout, unspecified 05/13/2019  . Nausea 05/13/2019  . Other fatigue 05/13/2019  . Secondary hyperparathyroidism of renal origin ([place]) 05/13/2019  . End stage renal disease ([place]) 05/13/2019  . Chronic diastolic (congestive) heart failure ([place]) 05/13/2019  . Chronic kidney disease, unspecified 05/13/2019  . Obstructive sleep apnea 05/13/2019  . Discomfort of right ear 02/18/2019  . Chronic kidney disease (CKD), stage IV (severe) ([place]) 06/15/2018  . Hypokalemia 06/13/2018  . Abnormal CT scan, small bowel   . Perennial allergic rhinitis with a predominant nonallergic component 02/09/2018  . Allergic conjunctivitis 02/09/2018  . Mild intermittent asthma 02/09/2018  . Atherosclerosis 12/09/2017  . Hypothyroidism 11/19/2017  . Mixed hyperlipidemia 10/13/2017  . Personal history of gout 10/13/2017  . S/P cholecystectomy 10/13/2017  . Congestive heart failure with LV diastolic dysfunction, NYHA class 1 ([place]) 10/13/2017  . Hypothyroidism (acquired) 10/13/2017  . 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 SOB (shortness of breath) 06/07/2017  . Arthritis, gouty 01/27/2017  . Chronic diastolic CHF (congestive heart failure) ([place]) 01/06/2017  . OSA (obstructive sleep apnea) 01/06/2017  . Abnormal electrocardiogram (ECG) (EKG) 10/31/2016  . Chest pain 10/09/2016  . HIV disease ([place]) 10/09/2016  . Hypertensive heart disease with CHF (congestive heart failure) ([place]) 10/09/2016  . Chronic kidney disease, stage 4 (severe) ([place]) 05/14/2016  . Left-sided low back pain without sciatica 05/29/2015  . NYHA class 2 heart failure with preserved ejection fraction ([place]) 05/15/2015  . PAH (pulmonary artery hypertension) ([place]) 05/08/2015  . Hypertension 05/04/2015  . Non-rheumatic mitral regurgitation 05/04/2015  . Prolonged Q-T interval on ECG 05/04/2015  . Human immunodeficiency virus (HIV) disease ([place]) 05/04/2015    Past Surgical History:    Procedure Laterality Date  . AV FISTULA PLACEMENT Left 07/02/2018   Procedure: Creation of Left arm BRACHIOBASILIC ARTERIOVENOUS  FISTULA;  Surgeon: Marty Heck, MD;  Location: [facility];  Service: Vascular;  Laterality: Left;  . BASCILIC VEIN TRANSPOSITION Left 08/27/2018   Procedure: Left arm BRACHIOBASILIC VEIN TRANSPOSITION SECOND STAGE;  Surgeon: Marty Heck, MD;  Location: [facility];  Service: Vascular;  Laterality: Left;  . CHOLECYSTECTOMY    . COLONOSCOPY W/ BIOPSIES AND POLYPECTOMY    . GIVENS CAPSULE STUDY N/A 03/01/2018   Procedure: GIVENS CAPSULE STUDY;  Surgeon: Jerene Bears, MD;  Location: [facility];  Service: Gastroenterology;  Laterality: N/A;  . HERNIA REPAIR     As baby  . MULTIPLE TOOTH EXTRACTIONS    . NOSE SURGERY    . RENAL BIOPSY         Family History  Problem Relation Age of Onset  . Heart failure Mother   . Heart attack Maternal Grandmother 53  . Hypertension Sister   . Multiple sclerosis Sister   . Hypertension Brother   . Hypertension Sister   . Scoliosis Other   .  Allergic rhinitis Neg Hx   . Angioedema Neg Hx   . Asthma Neg Hx   . Eczema Neg Hx   . Immunodeficiency Neg Hx   . Urticaria Neg Hx     Social History   Tobacco Use  . Smoking status: Former Smoker    Types: Cigarettes    Quit date: 2000    Years since quitting: 21.8  . Smokeless tobacco: Never Used  Vaping Use  . Vaping Use: Never used  Substance Use Topics  . Alcohol use: Not Currently    Alcohol/week: 2.0 standard drinks    Types: 2 Shots of liquor per week    Comment: occ  . Drug use: No    Home Medications Prior to Admission medications   Medication Sig Start Date End Date Taking? Authorizing Provider  abacavir (ZIAGEN) 300 MG tablet Take 1 tablet (300 mg total) by mouth daily. 06/04/20   Campbell Riches, MD  albuterol (VENTOLIN HFA) 108 (90 Base) MCG/ACT inhaler INHALE 2 PUFFS INTO THE LUNGS EVERY 4 HOURS AS NEEDED FOR WHEEZING OR SHORTNESS OF BREATH  08/13/20   Valentina Shaggy, MD  allopurinol (ZYLOPRIM) 100 MG tablet Take 1 tablet (100 mg total) by mouth 2 (two) times daily. 05/25/20   [name], FNP  amLODipine (NORVASC) 10 MG tablet Take 1 tablet (10 mg total) by mouth daily. 10/31/16   [name], FNP  amoxicillin (AMOXIL) 500 MG capsule SMARTSIG:4 Capsule(s) By Mouth Once 07/06/20   [provider]  Ascorbic Acid (VITAMIN C WITH ROSE HIPS) 1000 MG tablet Take 1,000 mg by mouth daily.    [provider]  azelastine (ASTELIN) 0.1 % nasal spray Place 2 sprays into both nostrils 2 (two) times daily as needed for rhinitis. Use in each nostril as directed 05/24/20   Valentina Shaggy, MD  B Complex-C-Zn-Folic Acid (DIALYVITE/ZINC) TABS TK 1 T PO QPM 06/14/19   [provider]  calcitRIOL (ROCALTROL) 0.5 MCG capsule Take by mouth. 04/05/20   [provider]  carvedilol (COREG) 25 MG tablet TAKE 1 TABLET BY MOUTH TWICE DAILY WITH FOOD 07/21/19   [name], MD  famotidine (PEPCID) 20 MG tablet TAKE 1 TABLET(20 MG) BY MOUTH TWICE DAILY 02/29/20   [name], MD  fluticasone (FLOVENT HFA) 110 MCG/ACT inhaler Inhale 2 puffs into the lungs 2 (two) times daily. 05/24/20   Valentina Shaggy, MD  gabapentin (NEURONTIN) 300 MG capsule TAKE 1 CAPSULE(300 MG) BY MOUTH THREE TIMES DAILY 05/21/20   [name], FNP  hydrALAZINE (APRESOLINE) 100 MG tablet TAKE 1 TABLET BY MOUTH THREE TIMES DAILY 02/01/20   [name], MD  hyoscyamine (LEVSIN SL) 0.125 MG SL tablet Place 1-2 tablets (0.125-0.25 mg total) under the tongue every 8 (eight) hours as needed (lower abdominal pain, spasm). 08/05/19   Pyrtle, Lajuan Lines, MD  ipratropium (ATROVENT) 0.06 % nasal spray USE 2 SPRAYS IN EACH NOSTRIL 2 TO 3 TIMES DAILY AS NEEDED 07/30/20   Valentina Shaggy, MD  lactulose [facility]) 10 GM/15ML solution Take by mouth. 07/08/20   [provider]  lactulose, encephalopathy, ([place]) 10 GM/15ML SOLN  SMARTSIG:60 Milliliter(s) By Mouth As Directed 04/25/20   [provider]  lamivudine (EPIVIR) 100 MG tablet TAKE 1 TABLET(100 MG) BY MOUTH DAILY 06/04/20   Campbell Riches, MD  levothyroxine (SYNTHROID) 25 MCG tablet Take 1 tablet by mouth daily before breakfast 05/21/20   [name], FNP  lidocaine-prilocaine (EMLA) cream APPLY SMALL AMOUNT  TO ACCESS SITE (AVF) 1 TO 2 HOURS BEFORE DIALYSIS. COVER WITH OCCLUSIVE DRESSING (SARAN WRAP) 06/01/19   [provider]  lubiprostone (AMITIZA) 24 MCG capsule TAKE 1 CAPSULE(24 MCG) BY MOUTH TWICE DAILY WITH A MEAL 05/29/20   Pyrtle, Lajuan Lines, MD  mupirocin ointment (BACTROBAN) 2 % Apply topically. 04/24/20   [provider]  Olopatadine HCl (PAZEO) 0.7 % SOLN Place 1 drop into both eyes daily. 05/24/20   Valentina Shaggy, MD  raltegravir (ISENTRESS) 400 MG tablet Take 1 tablet (400 mg total) by mouth 2 (two) times daily. 06/04/20   Campbell Riches, MD  Spacer/Aero-Holding Josiah Lobo DEVI 1 Device by Does not apply route 2 (two) times a day. 03/01/19   Bobbitt, Sedalia Muta, MD  Turmeric 500 MG CAPS Take 500 mg by mouth daily.    [provider]  VELPHORO 500 MG chewable tablet Chew 500 mg by mouth 3 (three) times daily. 04/26/20   [provider]  Vitamin D, Ergocalciferol, (DRISDOL) 1.25 MG (50000 UNIT) CAPS capsule TAKE 1 CAPSULE BY MOUTH EVERY 7 DAYS 07/11/20   [name], FNP  vitamin E (VITAMIN E) 180 MG (400 UNITS) capsule Take 400 Units by mouth daily.    [provider]  zinc gluconate 50 MG tablet Take 50 mg by mouth daily.     [provider]    Allergies    Ace inhibitors and Lisinopril  Review of Systems   Review of Systems  Constitutional: Negative for appetite change, chills and fever.  Respiratory: Negative for shortness of breath.   Cardiovascular: Negative for chest pain.  Gastrointestinal: Positive for abdominal pain, diarrhea, nausea and vomiting.  Genitourinary:  Positive for frequency. Negative for discharge, dysuria, flank pain, penile pain, penile swelling, scrotal swelling, testicular pain and urgency.  Musculoskeletal: Negative for back pain.  Skin: Negative for rash.  Allergic/Immunologic: Negative for immunocompromised state.  Neurological: Negative for seizures, syncope, weakness, numbness and headaches.  Psychiatric/Behavioral: Negative for confusion.    Physical Exam Updated Vital Signs BP 136/74 (BP Location: Right Arm)   Pulse 73   Temp 98.9 F (37.2 C) (Oral)   Resp 18   Ht 5' 10"  (1.778 m)   Wt 93.4 kg   SpO2 96%   BMI 29.56 kg/m   Physical Exam Vitals and nursing Eaton reviewed. Exam conducted with a chaperone present.  Constitutional:      Appearance: He is well-developed. He is not toxic-appearing.     Comments: Chronically ill-appearing  HENT:     Head: Normocephalic.     Mouth/Throat:     Pharynx: No oropharyngeal exudate or posterior oropharyngeal erythema.  Eyes:     Conjunctiva/sclera: Conjunctivae normal.  Cardiovascular:     Rate and Rhythm: Normal rate and regular rhythm.     Pulses: Normal pulses.     Heart sounds: Normal heart sounds. No murmur heard.  No friction rub. No gallop.   Pulmonary:     Effort: Pulmonary effort is normal. No respiratory distress.     Breath sounds: No stridor. No wheezing, rhonchi or rales.  Chest:     Chest wall: No tenderness.  Abdominal:     General: There is no distension.     Palpations: Abdomen is soft. There is no mass.     Tenderness: There is abdominal tenderness. There is no right CVA tenderness, left CVA tenderness, guarding or rebound.     Hernia: No hernia is present.     Comments: Tender to  palpation in the inferior suprapubic region.  Abdomen is soft and nondistended.  Hypoactive bowel sounds in all 4 quadrants.  No rebound or guarding.  Genitourinary:    Pubic Area: No rash.      Penis: No paraphimosis or erythema.      Testes: Normal. Cremasteric reflex is  present.     Epididymis:     Right: Normal.     Left: Normal.       Comments: Chaperoned exam.  No inguinal lymphadenopathy bilaterally.  Musculoskeletal:     Cervical back: Neck supple.     Right lower leg: No edema.     Left lower leg: No edema.  Lymphadenopathy:     Lower Body: No right inguinal adenopathy. No left inguinal adenopathy.  Skin:    General: Skin is warm and dry.  Neurological:     Mental Status: He is alert.  Psychiatric:        Behavior: Behavior normal.     ED Results / Procedures / Treatments   Labs (all labs ordered are listed, but only abnormal results are displayed) Labs Reviewed  LIPASE, BLOOD - Abnormal; Notable for the following components:      Result Value   Lipase 283 (*)    All other components within normal limits  COMPREHENSIVE METABOLIC PANEL - Abnormal; Notable for the following components:   Potassium 3.1 (*)    Glucose, Bld 109 (*)    BUN 43 (*)    Creatinine, Ser 7.10 (*)    AST 14 (*)    GFR, Estimated 8 (*)    All other components within normal limits  CBC - Abnormal; Notable for the following components:   RBC 2.72 (*)    Hemoglobin 9.6 (*)    HCT 28.1 (*)    MCV 103.3 (*)    MCH 35.3 (*)    All other components within normal limits  URINALYSIS, ROUTINE W REFLEX MICROSCOPIC - Abnormal; Notable for the following components:   APPearance HAZY (*)    Protein, ur >=300 (*)    All other components within normal limits    EKG None  Radiology CT Abdomen Pelvis Wo Contrast  Result Date: 08/14/2020 CLINICAL DATA:  54 year old male with lower abdominal pain with diarrhea for 1 month. On dialysis. HIV, hepatitis B. EXAM: CT ABDOMEN AND PELVIS WITHOUT CONTRAST TECHNIQUE: Multidetector CT imaging of the abdomen and pelvis was performed following the standard protocol without IV contrast. Oral contrast was administered. COMPARISON:  CT Abdomen and Pelvis 07/11/2020 and earlier. FINDINGS: Lower chest: Stable mild cardiomegaly. No  pericardial or pleural effusion. Mild lung base atelectasis. Hepatobiliary: Absent gallbladder as before. Negative noncontrast liver. Pancreas: Negative. Spleen: Negative. Adrenals/Urinary Tract: Normal adrenal glands. Stable kidneys. No hydronephrosis. No hydroureter. Diminutive and unremarkable urinary bladder. Chronic pelvic phleboliths. Stomach/Bowel: Oral contrast administered and has reached the sigmoid colon without evidence of obstruction. Mildly redundant sigmoid. No large bowel inflammation. Redundant right colon. Normal appendix (series 3, image 49). No dilated or abnormal small bowel loops identified. Unremarkable stomach, duodenum. No free air, free fluid, mesenteric stranding. Vascular/Lymphatic: Vascular patency is not evaluated in the absence of IV contrast. Calcified aortic atherosclerosis. Stable mild aortoiliac tortuosity. No lymphadenopathy. Reproductive: Stable small fat containing inguinal hernias. Other: No pelvic free fluid. Musculoskeletal: No acute osseous abnormality identified. IMPRESSION: 1. No acute or inflammatory process identified in the abdomen or pelvis. Normal appendix. 2. Stable mild cardiomegaly.  Aortic Atherosclerosis (ICD10-I70.0). Electronically Signed   By: Herminio Heads.D.  On: 08/14/2020 01:18    Procedures Procedures (including critical care time)  Medications Ordered in ED Medications  iohexol (OMNIPAQUE) 9 MG/ML oral solution 500 mL ( Oral Canceled Entry 08/14/20 0315)  HYDROmorphone (DILAUDID) injection 1 mg (1 mg Intravenous Given 08/14/20 3762)    ED Course  I have reviewed the triage vital signs and the nursing notes.  Pertinent labs & imaging results that were available during my care of the patient were reviewed by me and considered in my medical decision making (see chart for details).  Clinical Course as of Aug 14 749  Tue Aug 14, 2020  0447 Patient recheck. Abdominal pain is improving. It has now been almost 24 hours since his last episode of  vomiting or diarrhea. He was able to fluid challenge by drinking the oral contrast for his CT study. He would like to defer repeat fluid challenge and be discharged home so he can keep in his appointment with GI this morning, which I am in agreement with.   [MM]    Clinical Course User Index [MM] Abb Gobert, Laymond Purser, PA-C   MDM Rules/Calculators/A&P                          54 year old male with a history of HIV, chronic diastolic CHF, ESRD on HD (M/W/F), asthma, allergic conjunctivitis, ileitis, OSA, HLD, prolonged QT who presents to the emergency department with acute on chronic suprapubic abdominal pain accompanied by nausea, vomiting, diarrhea.  Initially hypertensive on arrival, I suspect this may have been due to pain as blood pressure normalized at discharge without other intervention.  He is afebrile.  No tachypnea, tachycardia, or hypoxia.  Patient had countless episodes of vomiting and diarrhea simultaneously for several hours.  Both symptoms have since subsided for approximately 24 hours from onset.  However, he continues to endorse severe suprapubic pain, which she has been having for several months and is being followed closely by gastroenterology.  Labs and imaging have been reviewed and independently evaluated by me.  Despite missing dialysis yesterday, he is mildly hypokalemic at 3.1.  Creatinine is elevated at 7.1.  Hemoglobin is decreased from his baseline at 9.6, but he is having no bleeding.  No melena or hematochezia.  Lipase is elevated at 283.  However, he has no focal upper abdominal pain.  Given suprapubic pain, UA was obtained and demonstrates 3+ proteinuria, but no evidence of infection.  Noncontrasted CT of the abdomen and pelvis is unremarkable for acute findings.  He does not have a surgical abdomen.  His GU exam was unremarkable.  Low suspicion for bowel obstruction, testicular torsion, epididymitis, diverticulitis, pyelonephritis, obstructive uropathy, appendicitis,  pancreatitis, cholecystitis.  Given limited episode of simultaneous vomiting and diarrhea, question viral gastroenteritis versus suspicious food intake.  However, patient does not have any concerns for recent suspicious food intake.    Given that his symptoms were self-limited and he is improved, I think he is appropriate for discharge home with close outpatient follow-up. After shared decision-making conversation, he also agreed and would like to be discharged so he can keep his a.m. appointment with gastroenterology.  I think that this is reasonable at this time as he has been monitored in the ER for approximately 19 hours without clinical decompensation.  Discussed discharging the patient home with a short course of pain medication, which he declined.  He was successfully fluid challenged, and given his history of prolonged QTC, will avoid sending the patient home with  antiemetics.  He is hemodynamically stable and in no acute distress.  Safe for discharge home with outpatient follow-up as indicated.  ER return precautions given.  Final Clinical Impression(s) / ED Diagnoses Final diagnoses:  Nausea vomiting and diarrhea  Suprapubic abdominal pain    Rx / DC Orders ED Discharge Orders    None       Joanne Gavel, PA-C 08/14/20 0750    Merryl Hacker, MD 08/15/20 [phone]

## 2020-08-14 NOTE — Patient Instructions (Addendum)
If you are age 54 or older, your body mass index should be between 23-30. Your Body mass index is 29.56 kg/m. If this is out of the aforementioned range listed, please consider follow up with your Primary Care Provider.  If you are age 41 or younger, your body mass index should be between 19-25. Your Body mass index is 29.56 kg/m. If this is out of the aformentioned range listed, please consider follow up with your Primary Care Provider.   Prescription sent for Tramadol 49m 1 tablet every 4-6 hours for acute pain. Qty 15  Take your Hyoscyamine 2 tablets every 4-6 hours for abdominal pain.  You have been scheduled with an appointment with [facility] on 09/06/2020 @11 :00. Please contact there office daily for cancellations. There number is [phone] #[phone].  If your pain persists or worsens please go to the Emergency Room.  Due to recent changes in healthcare laws, you may see the results of your imaging and laboratory studies on MyChart before your provider has had a chance to review them.  We understand that in some cases there may be results that are confusing or concerning to you. Not all laboratory results come back in the same time frame and the provider may be waiting for multiple results in order to interpret others.  Please give [email or web address] hours in order for your provider to thoroughly review all the results before contacting the office for clarification of your results.

## 2020-08-15 ENCOUNTER — Emergency Department (HOSPITAL_COMMUNITY): Payer: Medicare Other

## 2020-08-15 ENCOUNTER — Encounter (HOSPITAL_COMMUNITY): Payer: Self-pay

## 2020-08-15 ENCOUNTER — Emergency Department (HOSPITAL_COMMUNITY)
Admission: EM | Admit: 2020-08-15 | Discharge: 2020-08-16 | Disposition: A | Discharge status: Home / Self Care | Payer: Medicare Other | Attending: Emergency Medicine | Admitting: Emergency Medicine

## 2020-08-15 ENCOUNTER — Other Ambulatory Visit: Payer: Self-pay

## 2020-08-15 DIAGNOSIS — Z87891 Personal history of nicotine dependence: Secondary | ICD-10-CM | POA: Diagnosis not present

## 2020-08-15 DIAGNOSIS — I5032 Chronic diastolic (congestive) heart failure: Secondary | ICD-10-CM | POA: Insufficient documentation

## 2020-08-15 DIAGNOSIS — N186 End stage renal disease: Secondary | ICD-10-CM | POA: Insufficient documentation

## 2020-08-15 DIAGNOSIS — E039 Hypothyroidism, unspecified: Secondary | ICD-10-CM | POA: Insufficient documentation

## 2020-08-15 DIAGNOSIS — Z20822 Contact with and (suspected) exposure to covid-19: Secondary | ICD-10-CM | POA: Diagnosis not present

## 2020-08-15 DIAGNOSIS — R059 Cough, unspecified: Secondary | ICD-10-CM | POA: Diagnosis not present

## 2020-08-15 DIAGNOSIS — E8779 Other fluid overload: Secondary | ICD-10-CM | POA: Diagnosis not present

## 2020-08-15 DIAGNOSIS — I132 Hypertensive heart and chronic kidney disease with heart failure and with stage 5 chronic kidney disease, or end stage renal disease: Secondary | ICD-10-CM | POA: Insufficient documentation

## 2020-08-15 DIAGNOSIS — Z7951 Long term (current) use of inhaled steroids: Secondary | ICD-10-CM | POA: Diagnosis not present

## 2020-08-15 DIAGNOSIS — Z79899 Other long term (current) drug therapy: Secondary | ICD-10-CM | POA: Diagnosis not present

## 2020-08-15 DIAGNOSIS — Z992 Dependence on renal dialysis: Secondary | ICD-10-CM | POA: Diagnosis not present

## 2020-08-15 DIAGNOSIS — E8770 Fluid overload, unspecified: Secondary | ICD-10-CM | POA: Insufficient documentation

## 2020-08-15 DIAGNOSIS — R0789 Other chest pain: Secondary | ICD-10-CM | POA: Diagnosis not present

## 2020-08-15 DIAGNOSIS — J4520 Mild intermittent asthma, uncomplicated: Secondary | ICD-10-CM | POA: Diagnosis not present

## 2020-08-15 DIAGNOSIS — R0602 Shortness of breath: Secondary | ICD-10-CM

## 2020-08-15 DIAGNOSIS — R079 Chest pain, unspecified: Secondary | ICD-10-CM | POA: Diagnosis not present

## 2020-08-15 LAB — BASIC METABOLIC PANEL
Anion gap: 16 — ABNORMAL HIGH (ref 5–15)
BUN: 43 mg/dL — ABNORMAL HIGH (ref 6–20)
CO2: 19 mmol/L — ABNORMAL LOW (ref 22–32)
Calcium: 9.3 mg/dL (ref 8.9–10.3)
Chloride: 105 mmol/L (ref 98–111)
Creatinine, Ser: 7 mg/dL — ABNORMAL HIGH (ref 0.61–1.24)
GFR, Estimated: 8 mL/min — ABNORMAL LOW (ref >60)
Glucose, Bld: 97 mg/dL (ref 70–99)
Potassium: 3.3 mmol/L — ABNORMAL LOW (ref 3.5–5.1)
Sodium: 140 mmol/L (ref 135–145)

## 2020-08-15 LAB — CBC
HCT: 25.9 % — ABNORMAL LOW (ref 39.0–52.0)
Hemoglobin: 8.9 g/dL — ABNORMAL LOW (ref 13.0–17.0)
MCH: 35 pg — ABNORMAL HIGH (ref 26.0–34.0)
MCHC: 34.4 g/dL (ref 30.0–36.0)
MCV: 102 fL — ABNORMAL HIGH (ref 80.0–100.0)
Platelets: 199 10*3/uL (ref 150–400)
RBC: 2.54 MIL/uL — ABNORMAL LOW (ref 4.22–5.81)
RDW: 12.7 % (ref 11.5–15.5)
WBC: 8 10*3/uL (ref 4.0–10.5)
nRBC: 0 % (ref 0.0–0.2)

## 2020-08-15 LAB — RESPIRATORY PANEL BY RT PCR (FLU A&B, COVID)
Influenza A by PCR: NEGATIVE
Influenza B by PCR: NEGATIVE
SARS Coronavirus 2 by RT PCR: NEGATIVE

## 2020-08-15 LAB — TROPONIN I (HIGH SENSITIVITY): Troponin I (High Sensitivity): 61 ng/L — ABNORMAL HIGH (ref <18)

## 2020-08-15 IMAGING — CR DG CHEST 1V PORT
1 series · 1 of 1 positions shown · non-contrast
Comparison: [DATE]

CLINICAL DATA: Chest pain for several days

EXAM:
PORTABLE CHEST 1 VIEW

[chest pa]
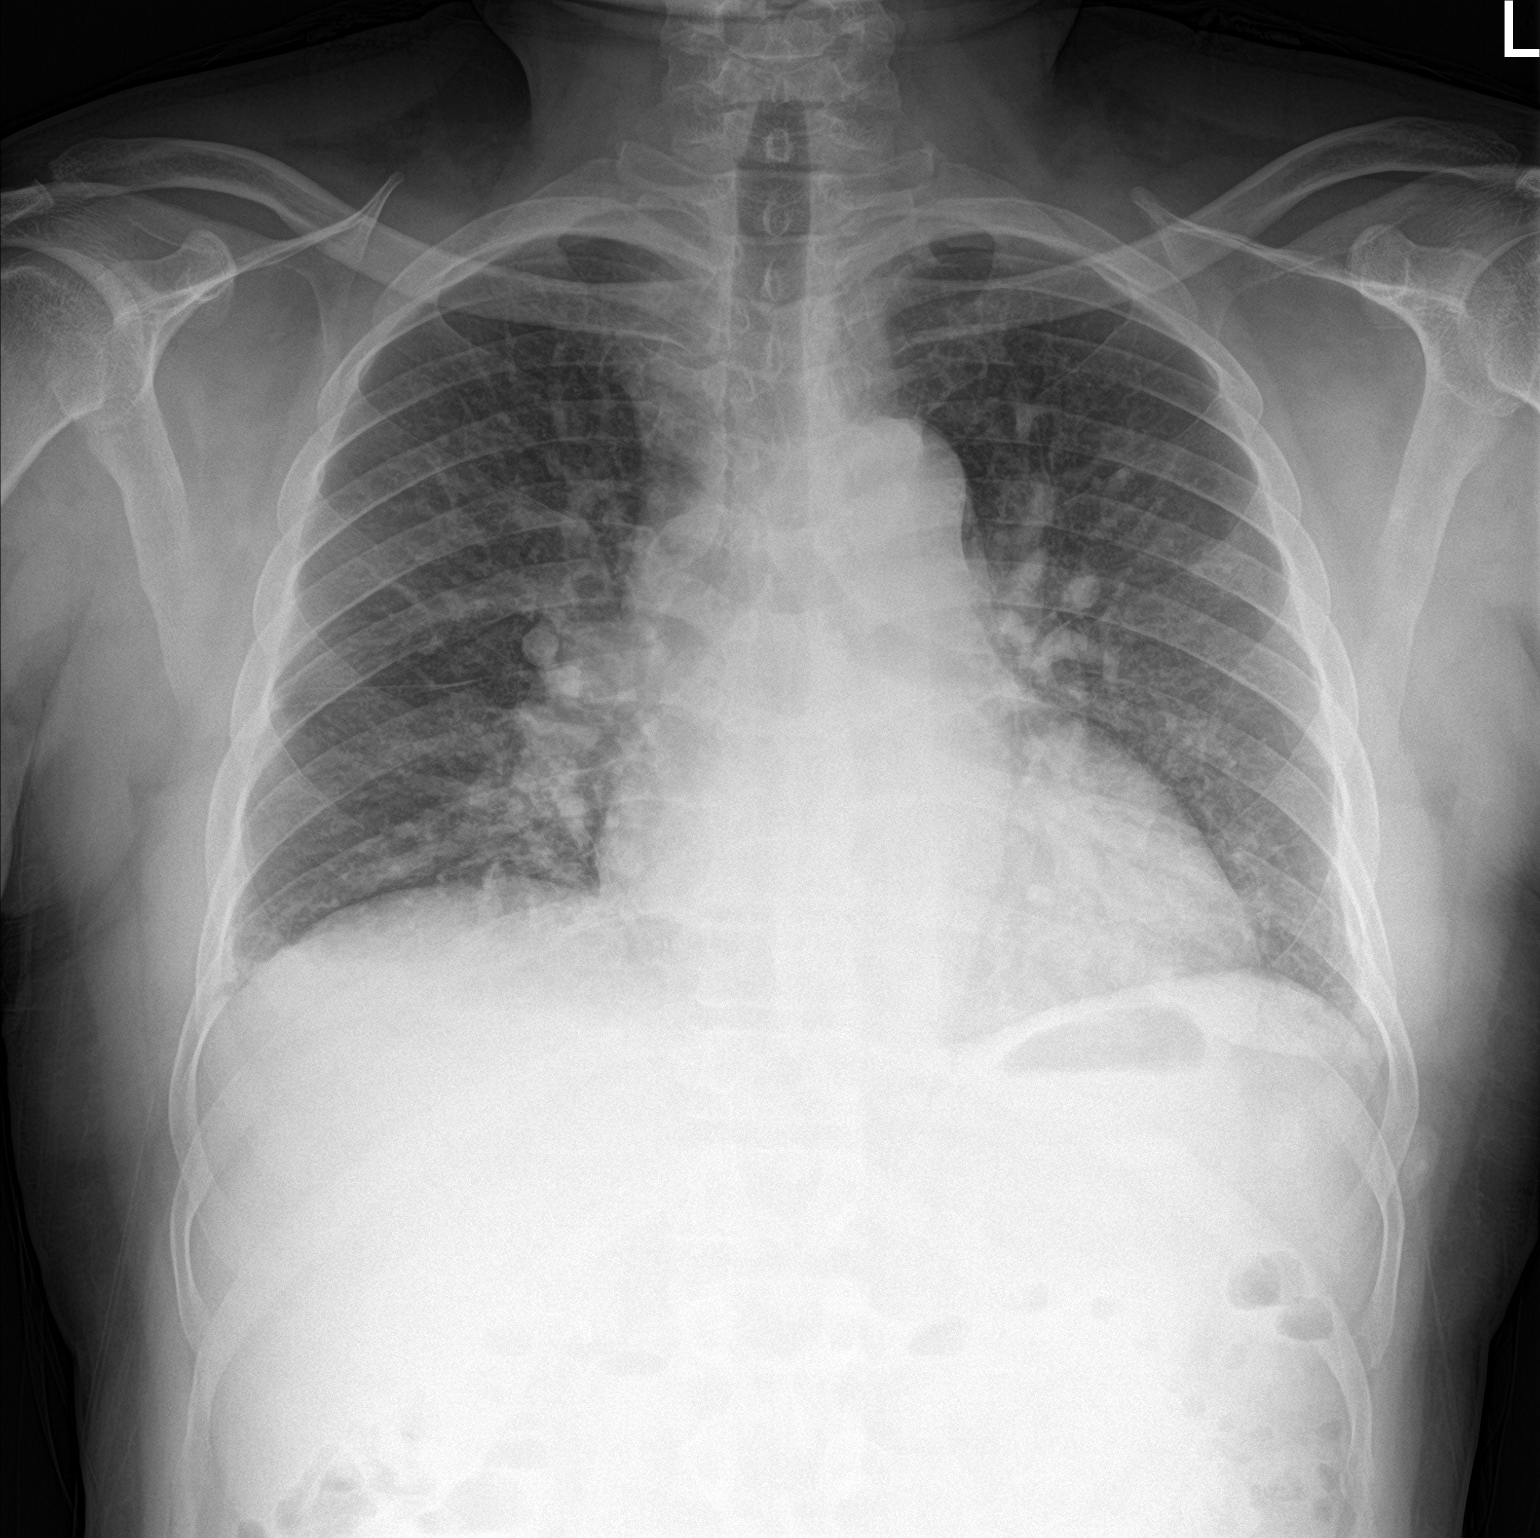

[1 of 1 positions shown; findings below may reference images not displayed]

FINDINGS: Cardiac shadow is within normal limits. Increased vascular
congestion is noted without interstitial edema. No focal infiltrate
or effusion is noted. No bony abnormality is seen.
IMPRESSION: Mild vascular congestion without interstitial edema.

## 2020-08-15 NOTE — ED Triage Notes (Signed)
Pt arrives to ED w/ c/o L sided 7/10 chest tightness that started 4 days ago. Pt also reports new onset of fever that started this evening. Pt is mon, tues, thurs, fri home dialysis pt. Pt states he missed mon, tues, and thurs, dialysis.

## 2020-08-16 DIAGNOSIS — D631 Anemia in chronic kidney disease: Secondary | ICD-10-CM | POA: Diagnosis not present

## 2020-08-16 DIAGNOSIS — D509 Iron deficiency anemia, unspecified: Secondary | ICD-10-CM | POA: Diagnosis not present

## 2020-08-16 DIAGNOSIS — E876 Hypokalemia: Secondary | ICD-10-CM | POA: Diagnosis not present

## 2020-08-16 DIAGNOSIS — N2581 Secondary hyperparathyroidism of renal origin: Secondary | ICD-10-CM | POA: Diagnosis not present

## 2020-08-16 DIAGNOSIS — Z79899 Other long term (current) drug therapy: Secondary | ICD-10-CM | POA: Diagnosis not present

## 2020-08-16 DIAGNOSIS — N2589 Other disorders resulting from impaired renal tubular function: Secondary | ICD-10-CM | POA: Diagnosis not present

## 2020-08-16 DIAGNOSIS — E8770 Fluid overload, unspecified: Secondary | ICD-10-CM | POA: Diagnosis not present

## 2020-08-16 DIAGNOSIS — R82998 Other abnormal findings in urine: Secondary | ICD-10-CM | POA: Diagnosis not present

## 2020-08-16 DIAGNOSIS — E440 Moderate protein-calorie malnutrition: Secondary | ICD-10-CM | POA: Diagnosis not present

## 2020-08-16 DIAGNOSIS — Z4931 Encounter for adequacy testing for hemodialysis: Secondary | ICD-10-CM | POA: Diagnosis not present

## 2020-08-16 DIAGNOSIS — Z992 Dependence on renal dialysis: Secondary | ICD-10-CM | POA: Diagnosis not present

## 2020-08-16 DIAGNOSIS — N186 End stage renal disease: Secondary | ICD-10-CM | POA: Diagnosis not present

## 2020-08-16 LAB — POC OCCULT BLOOD, ED: Fecal Occult Bld: NEGATIVE

## 2020-08-16 LAB — CULTURE, FUNGUS WITHOUT SMEAR
MICRO NUMBER:: 10976292
SPECIMEN QUALITY:: ADEQUATE

## 2020-08-16 LAB — TROPONIN I (HIGH SENSITIVITY): Troponin I (High Sensitivity): 65 ng/L — ABNORMAL HIGH (ref <18)

## 2020-08-16 MED ORDER — NITROGLYCERIN 2 % TD OINT
1.0000 [in_us] | TOPICAL_OINTMENT | Freq: Once | TRANSDERMAL | Status: AC
  Administered 2020-08-16: 1 [in_us] via TOPICAL
  Filled 2020-08-16: qty 1

## 2020-08-16 NOTE — ED Notes (Signed)
Discharge instructions discussed with pt. Pt verbalized understanding. Pt stable and ambulatory. No signature pad available. 

## 2020-08-16 NOTE — ED Notes (Signed)
Occult Card @ Bedside

## 2020-08-16 NOTE — ED Provider Notes (Signed)
[facility] EMERGENCY DEPARTMENT Provider Note   CSN: [id number] Arrival date & time: 08/15/20  2235     History Chief Complaint  Patient presents with  . Chest Pain    Albert Eaton is a 54 y.o. male.  HPI     This a 54 year old male with a history of end-stage renal disease on dialysis Monday, Tuesday, Thursday, and Friday, heart failure, hypertension, HIV who presents with chest pressure.  Patient reports 1 day history of ongoing chest pressure.  He thought it was his asthma but has not had much relief with his albuterol.  He reports a nonproductive cough and a temperature temporally at home of 102.4.  He does state that he believes sometimes the pressure is worse with exertion but not always.  He has not had any sick contacts or Covid exposures.  He is fully immunized against COVID-19.  Has not noted any lower extremity swelling.  He does have some associated shortness of breath.  He rates his pain at 4 out of 10.  Of note, patient does report a dark bowel movement just prior to evaluation.  He is not noted any gross blood in his stools.  No emesis.  Patient was seen and evaluated 2 days ago for ongoing abdominal pain.  At that time he last had dialysis on Friday.  His work-up was largely reassuring although he did have a downtrending hemoglobin.  Patient states that he was unable to go to dialysis on Tuesday as scheduled because he had an appointment.  Last dialysis session was on Friday.  Past Medical History:  Diagnosis Date  . Anemia   . Aortic atherosclerosis ([place])   . Asthma   . Chronic diastolic CHF (congestive heart failure) ([place]) 01/06/2017   Echo 7/16 [facility] in [place], [name]) Mild AI, mild LAE, mild concentric LVH, EF 55, normal wall motion, mild to moderate MR, mild PI, RVSP 55 // Echo 10/09/16 ([facility]):  Moderate LVH, grade 2 diastolic dysfunction, mild MR, moderate LAE   . CKD (chronic kidney disease)    per pt, waiting on a kidney  transplant in near future.  . Esophagitis   . Gout   . Hepatitis    Hep B  . History of nuclear stress test    a. Nuc study 7/16: no scar or ischemia, EF 42 // b. Nuc study 12/17: EF 48, ?small apical ischemia, Low Risk  . HIV (human immunodeficiency virus infection) ([place])   . HLD (hyperlipidemia)   . Hypertension   . Hypertensive heart disease with CHF (congestive heart failure) ([place]) 10/09/2016  . Mitral regurgitation   . OSA (obstructive sleep apnea)   . Post-operative nausea and vomiting    1 time as a child  . Sleep apnea    uses c-pap  . Wears glasses   . Wears partial dentures     Patient Active Problem List   Diagnosis Date Noted  . Lower abdominal pain 06/26/2020  . Pulsatile tinnitus of right ear 11/21/2019  . Kidney failure 11/21/2019  . CHF (congestive heart failure) ([place]) 11/21/2019  . Chills (without fever) 11/07/2019  . Hypercalcemia 10/17/2019  . Dependence on renal dialysis ([place]) 06/06/2019  . Coagulation defect, unspecified ([place]) 05/23/2019  . Iron deficiency anemia, unspecified 05/16/2019  . Anemia in other chronic diseases classified elsewhere 05/13/2019  . Arteriovenous fistula, acquired ([place]) 05/13/2019  . Body mass index (BMI) 28.0-28.9, adult 05/13/2019  . Crohn's disease of small intestine without complications ([place]) 94/80/1655  .  Encounter for screening for respiratory tuberculosis 05/13/2019  . Gout, unspecified 05/13/2019  . Nausea 05/13/2019  . Other fatigue 05/13/2019  . Secondary hyperparathyroidism of renal origin ([place]) 05/13/2019  . End stage renal disease ([place]) 05/13/2019  . Chronic diastolic (congestive) heart failure ([place]) 05/13/2019  . Chronic kidney disease, unspecified 05/13/2019  . Obstructive sleep apnea 05/13/2019  . Discomfort of right ear 02/18/2019  . Chronic kidney disease (CKD), stage IV (severe) ([place]) 06/15/2018  . Hypokalemia 06/13/2018  . Abnormal CT scan, small bowel   . Perennial allergic rhinitis with a predominant  nonallergic component 02/09/2018  . Allergic conjunctivitis 02/09/2018  . Mild intermittent asthma 02/09/2018  . Atherosclerosis 12/09/2017  . Hypothyroidism 11/19/2017  . Mixed hyperlipidemia 10/13/2017  . Personal history of gout 10/13/2017  . S/P cholecystectomy 10/13/2017  . Congestive heart failure with LV diastolic dysfunction, NYHA class 1 ([place]) 10/13/2017  . Hypothyroidism (acquired) 10/13/2017  . 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 SOB (shortness of breath) 06/07/2017  . Arthritis, gouty 01/27/2017  . Chronic diastolic CHF (congestive heart failure) ([place]) 01/06/2017  . OSA (obstructive sleep apnea) 01/06/2017  . Abnormal electrocardiogram (ECG) (EKG) 10/31/2016  . Chest pain 10/09/2016  . HIV disease ([place]) 10/09/2016  . Hypertensive heart disease with CHF (congestive heart failure) ([place]) 10/09/2016  . Chronic kidney disease, stage 4 (severe) ([place]) 05/14/2016  . Left-sided low back pain without sciatica 05/29/2015  . NYHA class 2 heart failure with preserved ejection fraction ([place]) 05/15/2015  . PAH (pulmonary artery hypertension) ([place]) 05/08/2015  . Hypertension 05/04/2015  . Non-rheumatic mitral regurgitation 05/04/2015  . Prolonged Q-T interval on ECG 05/04/2015  . Human immunodeficiency virus (HIV) disease ([place]) 05/04/2015    Past Surgical History:  Procedure Laterality Date  . AV FISTULA PLACEMENT Left 07/02/2018   Procedure: Creation of Left arm BRACHIOBASILIC ARTERIOVENOUS  FISTULA;  Surgeon: Marty Heck, MD;  Location: [place];  Service: Vascular;  Laterality: Left;  . BASCILIC VEIN TRANSPOSITION Left 08/27/2018   Procedure: Left arm BRACHIOBASILIC VEIN TRANSPOSITION SECOND STAGE;  Surgeon: Marty Heck, MD;  Location: [facility];  Service: Vascular;  Laterality: Left;  . CHOLECYSTECTOMY    . COLONOSCOPY W/ BIOPSIES AND POLYPECTOMY    . GIVENS CAPSULE STUDY N/A 03/01/2018   Procedure: GIVENS CAPSULE STUDY;  Surgeon: Jerene Bears, MD;  Location: [place] [facility];  Service: Gastroenterology;  Laterality: N/A;  . HERNIA REPAIR     As baby  . MULTIPLE TOOTH EXTRACTIONS    . NOSE SURGERY    . RENAL BIOPSY         Family History  Problem Relation Age of Onset  . Heart failure Mother   . Heart attack Maternal Grandmother 53  . Hypertension Sister   . Multiple sclerosis Sister   . Hypertension Brother   . Hypertension Sister   . Scoliosis Other   . Allergic rhinitis Neg Hx   . Angioedema Neg Hx   . Asthma Neg Hx   . Eczema Neg Hx   . Immunodeficiency Neg Hx   . Urticaria Neg Hx     Social History   Tobacco Use  . Smoking status: Former Smoker    Types: Cigarettes    Quit date: 2000    Years since quitting: 21.8  . Smokeless tobacco: Never Used  Vaping Use  . Vaping Use: Never used  Substance Use Topics  . Alcohol use: Not Currently    Alcohol/week: 2.0 standard drinks    Types:  2 Shots of liquor per week    Comment: occ  . Drug use: No    Home Medications Prior to Admission medications   Medication Sig Start Date End Date Taking? Authorizing Provider  abacavir (ZIAGEN) 300 MG tablet Take 1 tablet (300 mg total) by mouth daily. 06/04/20  Yes Campbell Riches, MD  albuterol (VENTOLIN HFA) 108 (90 Base) MCG/ACT inhaler INHALE 2 PUFFS INTO THE LUNGS EVERY 4 HOURS AS NEEDED FOR WHEEZING OR SHORTNESS OF BREATH Patient taking differently: Inhale 2 puffs into the lungs every 4 (four) hours as needed for wheezing or shortness of breath.  08/13/20  Yes Valentina Shaggy, MD  allopurinol (ZYLOPRIM) 100 MG tablet Take 1 tablet (100 mg total) by mouth 2 (two) times daily. 05/25/20  Yes [name], FNP  amLODipine (NORVASC) 10 MG tablet Take 1 tablet (10 mg total) by mouth daily. 10/31/16  Yes [name], FNP  Ascorbic Acid (VITAMIN C WITH ROSE HIPS) 1000 MG tablet Take 1,000 mg by mouth daily.   Yes [provider]  azelastine (ASTELIN) 0.1 % nasal spray Place 2 sprays into both nostrils 2 (two) times daily  as needed for rhinitis. Use in each nostril as directed 05/24/20  Yes Valentina Shaggy, MD  B Complex-C-Zn-Folic Acid (DIALYVITE/ZINC) TABS Take 1 tablet by mouth every evening.  06/14/19  Yes [provider]  calcitRIOL (ROCALTROL) 0.5 MCG capsule Take 0.5 mcg by mouth daily.  04/05/20  Yes [provider]  carvedilol (COREG) 25 MG tablet TAKE 1 TABLET BY MOUTH TWICE DAILY WITH FOOD Patient taking differently: Take 25 mg by mouth 2 (two) times daily with a meal.  08/14/20  Yes [name], MD  famotidine (PEPCID) 20 MG tablet TAKE 1 TABLET(20 MG) BY MOUTH TWICE DAILY Patient taking differently: Take 20 mg by mouth every evening.  02/29/20  Yes [name], MD  fluticasone (FLOVENT HFA) 110 MCG/ACT inhaler Inhale 2 puffs into the lungs 2 (two) times daily. 05/24/20  Yes Valentina Shaggy, MD  gabapentin (NEURONTIN) 300 MG capsule TAKE 1 CAPSULE(300 MG) BY MOUTH THREE TIMES DAILY Patient taking differently: Take 300 mg by mouth 3 (three) times daily. TAKE 1 CAPSULE(300 MG) BY MOUTH THREE TIMES DAILY 05/21/20  Yes [name], FNP  hydrALAZINE (APRESOLINE) 100 MG tablet TAKE 1 TABLET BY MOUTH THREE TIMES DAILY Patient taking differently: Take 100 mg by mouth 3 (three) times daily.  02/01/20  Yes [name], MD  hyoscyamine (LEVSIN SL) 0.125 MG SL tablet Place 1-2 tablets (0.125-0.25 mg total) under the tongue every 8 (eight) hours as needed (lower abdominal pain, spasm). 08/05/19  Yes Pyrtle, Lajuan Lines, MD  ipratropium (ATROVENT) 0.06 % nasal spray USE 2 SPRAYS IN EACH NOSTRIL 2 TO 3 TIMES DAILY AS NEEDED Patient taking differently: Place 2 sprays into both nostrils 3 (three) times daily as needed for rhinitis. USE 2 SPRAYS IN EACH NOSTRIL 2 TO 3 TIMES DAILY AS NEEDED 07/30/20  Yes Valentina Shaggy, MD  lactulose [facility]) 10 GM/15ML solution Take 20 g by mouth daily as needed for moderate constipation.  07/08/20  Yes [provider]  lamivudine (EPIVIR)  100 MG tablet TAKE 1 TABLET(100 MG) BY MOUTH DAILY Patient taking differently: Take 100 mg by mouth daily. TAKE 1 TABLET(100 MG) BY MOUTH DAILY 06/04/20  Yes Campbell Riches, MD  levothyroxine (SYNTHROID) 25 MCG tablet Take 1 tablet by mouth daily before breakfast 05/21/20  Yes [name], FNP  lidocaine-prilocaine (  EMLA) cream APPLY SMALL AMOUNT TO ACCESS SITE (AVF) 1 TO 2 HOURS BEFORE DIALYSIS. COVER WITH OCCLUSIVE DRESSING (SARAN WRAP) 06/01/19  Yes [provider]  lubiprostone (AMITIZA) 24 MCG capsule TAKE 1 CAPSULE(24 MCG) BY MOUTH TWICE DAILY WITH A MEAL Patient taking differently: Take 24 mcg by mouth 2 (two) times daily with a meal.  05/29/20  Yes Pyrtle, Lajuan Lines, MD  mupirocin ointment (BACTROBAN) 2 % Apply 1 application topically daily as needed (apply to port after dialysis).  04/24/20  Yes [provider]  Olopatadine HCl (PAZEO) 0.7 % SOLN Place 1 drop into both eyes daily. 05/24/20  Yes Valentina Shaggy, MD  raltegravir (ISENTRESS) 400 MG tablet Take 1 tablet (400 mg total) by mouth 2 (two) times daily. 06/04/20  Yes Campbell Riches, MD  traMADol (ULTRAM) 50 MG tablet Take 1 tablet (50 mg total) by mouth every 6 (six) hours as needed for moderate pain. 08/14/20  Yes Levin Erp, PA  Turmeric 500 MG CAPS Take 500 mg by mouth daily.   Yes [provider]  VELPHORO 500 MG chewable tablet Chew 500 mg by mouth 3 (three) times daily. 04/26/20  Yes [provider]  Vitamin D, Ergocalciferol, (DRISDOL) 1.25 MG (50000 UNIT) CAPS capsule TAKE 1 CAPSULE BY MOUTH EVERY 7 DAYS Patient taking differently: Take 50,000 Units by mouth every 7 (seven) days. TAKE 1 CAPSULE BY MOUTH EVERY 7 DAYS on Tuesdays 07/11/20  Yes [name], FNP  vitamin E (VITAMIN E) 180 MG (400 UNITS) capsule Take 400 Units by mouth daily.   Yes [provider]  zinc gluconate 50 MG tablet Take 50 mg by mouth daily.    Yes [provider]    Spacer/Aero-Holding Chambers DEVI 1 Device by Does not apply route 2 (two) times a day. 03/01/19   Bobbitt, Sedalia Muta, MD    Allergies    Ace inhibitors and Lisinopril  Review of Systems   Review of Systems  Constitutional: Positive for chills and fever.  Respiratory: Positive for cough and shortness of breath.   Cardiovascular: Positive for chest pain. Negative for palpitations and leg swelling.  Gastrointestinal: Negative for abdominal pain, constipation, diarrhea, nausea and vomiting.  All other systems reviewed and are negative.   Physical Exam Updated Vital Signs BP (!) 143/92   Pulse 93   Temp 99.4 F (37.4 C) (Oral)   Resp (!) 21   SpO2 96%   Physical Exam Vitals and nursing note reviewed.  Constitutional:      Appearance: He is well-developed.     Comments: Chronically ill-appearing but nontoxic, no acute distress  HENT:     Head: Normocephalic and atraumatic.  Eyes:     Pupils: Pupils are equal, round, and reactive to light.  Neck:     Vascular: JVD present.  Cardiovascular:     Rate and Rhythm: Normal rate and regular rhythm.     Heart sounds: Normal heart sounds. No murmur heard.   Pulmonary:     Effort: Pulmonary effort is normal. No respiratory distress.     Breath sounds: Normal breath sounds. No wheezing.     Comments: Distant breath sounds in all lung fields, no wheezing noted Abdominal:     General: Bowel sounds are normal.     Palpations: Abdomen is soft.     Tenderness: There is no abdominal tenderness. There is no rebound.  Genitourinary:    Rectum: Guaiac result negative.     Comments: No gross  blood Musculoskeletal:     Cervical back: Neck supple.     Right lower leg: No tenderness.     Left lower leg: No tenderness.     Comments: Trace bilateral lower extremity edema  Lymphadenopathy:     Cervical: No cervical adenopathy.  Skin:    General: Skin is warm and dry.  Neurological:     Mental Status: He is alert and oriented to person,  place, and time.  Psychiatric:        Mood and Affect: Mood normal.     ED Results / Procedures / Treatments   Labs (all labs ordered are listed, but only abnormal results are displayed) Labs Reviewed  BASIC METABOLIC PANEL - Abnormal; Notable for the following components:      Result Value   Potassium 3.3 (*)    CO2 19 (*)    BUN 43 (*)    Creatinine, Ser 7.00 (*)    GFR, Estimated 8 (*)    Anion gap 16 (*)    All other components within normal limits  CBC - Abnormal; Notable for the following components:   RBC 2.54 (*)    Hemoglobin 8.9 (*)    HCT 25.9 (*)    MCV 102.0 (*)    MCH 35.0 (*)    All other components within normal limits  TROPONIN I (HIGH SENSITIVITY) - Abnormal; Notable for the following components:   Troponin I (High Sensitivity) 61 (*)    All other components within normal limits  TROPONIN I (HIGH SENSITIVITY) - Abnormal; Notable for the following components:   Troponin I (High Sensitivity) 65 (*)    All other components within normal limits  RESPIRATORY PANEL BY RT PCR (FLU A&B, COVID)  POC OCCULT BLOOD, ED    EKG EKG Interpretation  Date/Time:  Wednesday August 15 2020 22:49:56 EDT Ventricular Rate:  104 PR Interval:  158 QRS Duration: 88 QT Interval:  380 QTC Calculation: 499 R Axis:   72 Text Interpretation: Sinus tachycardia with occasional and consecutive Premature ventricular complexes Minimal voltage criteria for LVH, may be normal variant ( Sokolow-Lyon ) Abnormal ECG Confirmed by Thayer Jew [phone]) on 08/16/2020 12:11:20 AM   Radiology DG Chest Portable 1 View  Result Date: 08/15/2020 CLINICAL DATA:  Chest pain for several days EXAM: PORTABLE CHEST 1 VIEW COMPARISON:  11/22/2019 FINDINGS: Cardiac shadow is within normal limits. Increased vascular congestion is noted without interstitial edema. No focal infiltrate or effusion is noted. No bony abnormality is seen. IMPRESSION: Mild vascular congestion without interstitial edema.  Electronically Signed   By: Inez Catalina M.D.   On: 08/15/2020 23:30    Procedures Procedures (including critical care time)  Medications Ordered in ED Medications  nitroGLYCERIN (NITROGLYN) 2 % ointment 1 inch (1 inch Topical Given 08/16/20 0024)    ED Course  I have reviewed the triage vital signs and the nursing notes.  Pertinent labs & imaging results that were available during my care of the patient were reviewed by me and considered in my medical decision making (see chart for details).  Clinical Course as of Aug 16 318  Thu Aug 16, 2020  3500 Patient ambulated in the hallway without difficulty.  O2 sats 97 to 100%.  No more short of breath and no obvious respiratory distress.  Suspect his chest tightness and shortness of breath are likely related to mild pulmonary edema.  I have stressed to the patient that he needs to go to dialysis as scheduled today.  He has no indication at this time for emergent dialysis.   [CH]    Clinical Course User Index [CH] Linnaea Ahn, Barbette Hair, MD   MDM Rules/Calculators/A&P                          Patient presents with shortness of breath and chest tightness.  He is overall nontoxic.  And vital signs are notable for a blood pressure of 164/97.  He is afebrile here.  He reports a temperature at home temporally with some chills.  Considerations include but not limited to, ACS, pneumonia, Covid, volume overload and pulmonary edema.  EKG shows no evidence of acute ischemia and is mostly unchanged from baseline.  Chest x-ray shows some mild vascular congestion without overt pulmonary edema.  No evidence of pneumothorax or pneumonia.  Covid testing and flu testing is negative.  Lab work-up otherwise notable for a creatinine of 7.  Potassium is 3.3.  Hemoglobin continues to downtrend to 8.9.  Guaiac of his stool was negative and he had no gross blood on exam.  His shortness of breath could be multifactorial but I feel is likely related to worsening anemia and  mild volume overload as he has not had dialysis since last Friday.  His troponins are elevated in the 60s but stable.  Given a creatinine of 7, this may be his baseline.  Have higher suspicion that his symptoms are related to volume overload rather than ACS.  His heart score is elevated, restratification does not take into account his creatinine which could artificially inflate his troponin readings.  Given that they are stable, feel that he is reasonable for outpatient follow-up.  Do not see any evidence of infectious source.  Patient was able to ambulate and keep his oxygen saturations in the 90s.  Discussed the importance that he needs to go to dialysis as scheduled later today.  He also needs to follow-up with his primary doctor and cardiology.  Patient stated understanding.  After history, exam, and medical workup I feel the patient has been appropriately medically screened and is safe for discharge home. Pertinent diagnoses were discussed with the patient. Patient was given return precautions.   Final Clinical Impression(s) / ED Diagnoses Final diagnoses:  Other hypervolemia  SOB (shortness of breath)  Chest tightness    Rx / DC Orders ED Discharge Orders    None       Ladona Rosten, Barbette Hair, MD 08/16/20 [id number]

## 2020-08-16 NOTE — ED Notes (Signed)
Pt ambulated well, independently, no increase SOB. Pt's O2 sat >97%

## 2020-08-16 NOTE — Progress Notes (Signed)
   Follow-Up Visit   Subjective  Albert Eaton is a 54 y.o. male who presents for the following: Rash (UNDER ARMS 01/2017 TRIED A CREAM DONT KNOW THE NAME).  Rash/dermatitis Location:  Duration:  Quality:  Associated Signs/Symptoms: Modifying Factors:  Severity:  Timing: Context:   Objective  Well appearing patient in no apparent distress; mood and affect are within normal limits.  A focused exam was performed on the following areas armpits both left and right. Both feet and toenails.    Assessment & Plan     First time I have met Albert Eaton date of birth [birth year].  Background medical information includes  well-controlled HIV and renal failure with chronic dialysis.  Since April 2018 he has had itching under both underarms.  He was tried on a short course of prednisone without significant clearing and this has waxed and waned ever since.  Anything moist whether it's deodorant or sweating may be an exacerbating factor.  Examination showed a subtly thickened diffuse hyperpigmentation under both arms reminiscent of acanthosis nigracans.  There is no involvement of the hands or the neck.  Initial KOH on the right underarm showed no fungus but this will be repeated in 1 hour and a fungal culture obtained.  There is no adenopathy under the right arm.  There is chronic fungus of multiple toenails and the right outer toe web, but clinically the axillary rash is not suggestive of tinea.  Some of the pigmentation may be related to scratching and postinflammatory hyperpigmentation.  Pending fungal culture, Albert Eaton will get over-the-counter 1 to 2% Chlortrimazole cream or lotion and apply this daily after bathing to both underarms.  There should be no side effect.  Additionally because scratching can perpetuate a repetitive scratch itch cycle, he will look for any over-the-counter cream containing pramoxine which may include CeraVe itch relief or [place] antiitch or he could Producer, television/film/video the  word  pramoxine.  He can apply this under the arms from 0-10 times a day as needed if there is itching.  If there is no improvement in 4 weeks, I have asked him to please call so we can review the results of the fungal culture.  If this is negative, I will likely try a noncortisone anti-inflammatory like tacrolimus ointment.  If this also produces no significant benefit, I have asked him to return for reexamination and possible biopsy in 2 months.   Dermatophytosis, unspecified  Other Related Procedures Culture, fungus without smear POCT Skin KOH  Acanthosis nigricans (2) Left Axilla; Right Axilla  KOH negative but waiting on fungal culture to come back. Patient can use otc cerave anti itch with pramoxine as the active ingredient. Also will get otc clotrimazole to apply to both axillas. Once the fungal comes back negative he will possibly try non steroid tacrolimus. Follow up in 2 months.   Culture, fungus without smear - Left Axilla, Right Axilla  POCT Skin KOH - Left Axilla, Right Axilla  Tinea pedis of right foot Right Hallux Toe Nail Plate  No treatment needed.   Tinea pedis of left foot Left Hallux Toe Nail Plate  No treatment needed.      I, Lavonna Monarch, MD, have reviewed all documentation for this visit.  The documentation on 08/19/20 for the exam, diagnosis, procedures, and orders are all accurate and complete.

## 2020-08-16 NOTE — Discharge Instructions (Addendum)
You were seen today for chest tightness and shortness of breath symptoms.  This is likely related to mild volume overload and pulmonary edema.  It is very important that you proceed to dialysis as scheduled later today.  Your Covid testing and infectious work-up is negative.

## 2020-08-17 DIAGNOSIS — N186 End stage renal disease: Secondary | ICD-10-CM | POA: Diagnosis not present

## 2020-08-17 DIAGNOSIS — E8770 Fluid overload, unspecified: Secondary | ICD-10-CM | POA: Diagnosis not present

## 2020-08-17 DIAGNOSIS — Z79899 Other long term (current) drug therapy: Secondary | ICD-10-CM | POA: Diagnosis not present

## 2020-08-17 DIAGNOSIS — R82998 Other abnormal findings in urine: Secondary | ICD-10-CM | POA: Diagnosis not present

## 2020-08-17 DIAGNOSIS — N2581 Secondary hyperparathyroidism of renal origin: Secondary | ICD-10-CM | POA: Diagnosis not present

## 2020-08-17 DIAGNOSIS — E440 Moderate protein-calorie malnutrition: Secondary | ICD-10-CM | POA: Diagnosis not present

## 2020-08-17 DIAGNOSIS — D631 Anemia in chronic kidney disease: Secondary | ICD-10-CM | POA: Diagnosis not present

## 2020-08-17 DIAGNOSIS — E876 Hypokalemia: Secondary | ICD-10-CM | POA: Diagnosis not present

## 2020-08-17 DIAGNOSIS — N2589 Other disorders resulting from impaired renal tubular function: Secondary | ICD-10-CM | POA: Diagnosis not present

## 2020-08-17 DIAGNOSIS — Z4931 Encounter for adequacy testing for hemodialysis: Secondary | ICD-10-CM | POA: Diagnosis not present

## 2020-08-17 DIAGNOSIS — Z992 Dependence on renal dialysis: Secondary | ICD-10-CM | POA: Diagnosis not present

## 2020-08-17 DIAGNOSIS — D509 Iron deficiency anemia, unspecified: Secondary | ICD-10-CM | POA: Diagnosis not present

## 2020-08-19 ENCOUNTER — Encounter: Payer: Self-pay | Admitting: Dermatology

## 2020-08-20 DIAGNOSIS — E8770 Fluid overload, unspecified: Secondary | ICD-10-CM | POA: Diagnosis not present

## 2020-08-20 DIAGNOSIS — E440 Moderate protein-calorie malnutrition: Secondary | ICD-10-CM | POA: Diagnosis not present

## 2020-08-20 DIAGNOSIS — D509 Iron deficiency anemia, unspecified: Secondary | ICD-10-CM | POA: Diagnosis not present

## 2020-08-20 DIAGNOSIS — E876 Hypokalemia: Secondary | ICD-10-CM | POA: Diagnosis not present

## 2020-08-20 DIAGNOSIS — N2589 Other disorders resulting from impaired renal tubular function: Secondary | ICD-10-CM | POA: Diagnosis not present

## 2020-08-20 DIAGNOSIS — Z992 Dependence on renal dialysis: Secondary | ICD-10-CM | POA: Diagnosis not present

## 2020-08-20 DIAGNOSIS — Z4931 Encounter for adequacy testing for hemodialysis: Secondary | ICD-10-CM | POA: Diagnosis not present

## 2020-08-20 DIAGNOSIS — Z79899 Other long term (current) drug therapy: Secondary | ICD-10-CM | POA: Diagnosis not present

## 2020-08-20 DIAGNOSIS — D631 Anemia in chronic kidney disease: Secondary | ICD-10-CM | POA: Diagnosis not present

## 2020-08-20 DIAGNOSIS — N186 End stage renal disease: Secondary | ICD-10-CM | POA: Diagnosis not present

## 2020-08-20 DIAGNOSIS — R82998 Other abnormal findings in urine: Secondary | ICD-10-CM | POA: Diagnosis not present

## 2020-08-20 DIAGNOSIS — N2581 Secondary hyperparathyroidism of renal origin: Secondary | ICD-10-CM | POA: Diagnosis not present

## 2020-08-21 DIAGNOSIS — D509 Iron deficiency anemia, unspecified: Secondary | ICD-10-CM | POA: Diagnosis not present

## 2020-08-21 DIAGNOSIS — D631 Anemia in chronic kidney disease: Secondary | ICD-10-CM | POA: Diagnosis not present

## 2020-08-21 DIAGNOSIS — E876 Hypokalemia: Secondary | ICD-10-CM | POA: Diagnosis not present

## 2020-08-21 DIAGNOSIS — N186 End stage renal disease: Secondary | ICD-10-CM | POA: Diagnosis not present

## 2020-08-21 DIAGNOSIS — Z79899 Other long term (current) drug therapy: Secondary | ICD-10-CM | POA: Diagnosis not present

## 2020-08-21 DIAGNOSIS — N2589 Other disorders resulting from impaired renal tubular function: Secondary | ICD-10-CM | POA: Diagnosis not present

## 2020-08-21 DIAGNOSIS — E440 Moderate protein-calorie malnutrition: Secondary | ICD-10-CM | POA: Diagnosis not present

## 2020-08-21 DIAGNOSIS — Z4931 Encounter for adequacy testing for hemodialysis: Secondary | ICD-10-CM | POA: Diagnosis not present

## 2020-08-21 DIAGNOSIS — Z992 Dependence on renal dialysis: Secondary | ICD-10-CM | POA: Diagnosis not present

## 2020-08-21 DIAGNOSIS — N2581 Secondary hyperparathyroidism of renal origin: Secondary | ICD-10-CM | POA: Diagnosis not present

## 2020-08-21 DIAGNOSIS — R82998 Other abnormal findings in urine: Secondary | ICD-10-CM | POA: Diagnosis not present

## 2020-08-21 DIAGNOSIS — E8770 Fluid overload, unspecified: Secondary | ICD-10-CM | POA: Diagnosis not present

## 2020-08-21 NOTE — Progress Notes (Signed)
Addendum: Reviewed and agree with assessment and management plan. Jionni Helming M, MD  

## 2020-08-23 DIAGNOSIS — Z992 Dependence on renal dialysis: Secondary | ICD-10-CM | POA: Diagnosis not present

## 2020-08-23 DIAGNOSIS — E440 Moderate protein-calorie malnutrition: Secondary | ICD-10-CM | POA: Diagnosis not present

## 2020-08-23 DIAGNOSIS — E8770 Fluid overload, unspecified: Secondary | ICD-10-CM | POA: Diagnosis not present

## 2020-08-23 DIAGNOSIS — Z4931 Encounter for adequacy testing for hemodialysis: Secondary | ICD-10-CM | POA: Diagnosis not present

## 2020-08-23 DIAGNOSIS — D509 Iron deficiency anemia, unspecified: Secondary | ICD-10-CM | POA: Diagnosis not present

## 2020-08-23 DIAGNOSIS — Z79899 Other long term (current) drug therapy: Secondary | ICD-10-CM | POA: Diagnosis not present

## 2020-08-23 DIAGNOSIS — D631 Anemia in chronic kidney disease: Secondary | ICD-10-CM | POA: Diagnosis not present

## 2020-08-23 DIAGNOSIS — R82998 Other abnormal findings in urine: Secondary | ICD-10-CM | POA: Diagnosis not present

## 2020-08-23 DIAGNOSIS — N186 End stage renal disease: Secondary | ICD-10-CM | POA: Diagnosis not present

## 2020-08-23 DIAGNOSIS — N2581 Secondary hyperparathyroidism of renal origin: Secondary | ICD-10-CM | POA: Diagnosis not present

## 2020-08-23 DIAGNOSIS — E876 Hypokalemia: Secondary | ICD-10-CM | POA: Diagnosis not present

## 2020-08-23 DIAGNOSIS — N2589 Other disorders resulting from impaired renal tubular function: Secondary | ICD-10-CM | POA: Diagnosis not present

## 2020-08-24 DIAGNOSIS — E440 Moderate protein-calorie malnutrition: Secondary | ICD-10-CM | POA: Diagnosis not present

## 2020-08-24 DIAGNOSIS — N2589 Other disorders resulting from impaired renal tubular function: Secondary | ICD-10-CM | POA: Diagnosis not present

## 2020-08-24 DIAGNOSIS — N186 End stage renal disease: Secondary | ICD-10-CM | POA: Diagnosis not present

## 2020-08-24 DIAGNOSIS — Z992 Dependence on renal dialysis: Secondary | ICD-10-CM | POA: Diagnosis not present

## 2020-08-24 DIAGNOSIS — E876 Hypokalemia: Secondary | ICD-10-CM | POA: Diagnosis not present

## 2020-08-24 DIAGNOSIS — Z79899 Other long term (current) drug therapy: Secondary | ICD-10-CM | POA: Diagnosis not present

## 2020-08-24 DIAGNOSIS — N2581 Secondary hyperparathyroidism of renal origin: Secondary | ICD-10-CM | POA: Diagnosis not present

## 2020-08-24 DIAGNOSIS — E8770 Fluid overload, unspecified: Secondary | ICD-10-CM | POA: Diagnosis not present

## 2020-08-24 DIAGNOSIS — D631 Anemia in chronic kidney disease: Secondary | ICD-10-CM | POA: Diagnosis not present

## 2020-08-24 DIAGNOSIS — Z4931 Encounter for adequacy testing for hemodialysis: Secondary | ICD-10-CM | POA: Diagnosis not present

## 2020-08-24 DIAGNOSIS — R82998 Other abnormal findings in urine: Secondary | ICD-10-CM | POA: Diagnosis not present

## 2020-08-24 DIAGNOSIS — D509 Iron deficiency anemia, unspecified: Secondary | ICD-10-CM | POA: Diagnosis not present

## 2020-08-26 DIAGNOSIS — I129 Hypertensive chronic kidney disease with stage 1 through stage 4 chronic kidney disease, or unspecified chronic kidney disease: Secondary | ICD-10-CM | POA: Diagnosis not present

## 2020-08-26 DIAGNOSIS — N186 End stage renal disease: Secondary | ICD-10-CM | POA: Diagnosis not present

## 2020-08-26 DIAGNOSIS — Z992 Dependence on renal dialysis: Secondary | ICD-10-CM | POA: Diagnosis not present

## 2020-08-27 DIAGNOSIS — Z79899 Other long term (current) drug therapy: Secondary | ICD-10-CM | POA: Diagnosis not present

## 2020-08-27 DIAGNOSIS — E440 Moderate protein-calorie malnutrition: Secondary | ICD-10-CM | POA: Diagnosis not present

## 2020-08-27 DIAGNOSIS — R82998 Other abnormal findings in urine: Secondary | ICD-10-CM | POA: Diagnosis not present

## 2020-08-27 DIAGNOSIS — N186 End stage renal disease: Secondary | ICD-10-CM | POA: Diagnosis not present

## 2020-08-27 DIAGNOSIS — E8770 Fluid overload, unspecified: Secondary | ICD-10-CM | POA: Diagnosis not present

## 2020-08-27 DIAGNOSIS — Z4931 Encounter for adequacy testing for hemodialysis: Secondary | ICD-10-CM | POA: Diagnosis not present

## 2020-08-27 DIAGNOSIS — D631 Anemia in chronic kidney disease: Secondary | ICD-10-CM | POA: Diagnosis not present

## 2020-08-27 DIAGNOSIS — K7689 Other specified diseases of liver: Secondary | ICD-10-CM | POA: Diagnosis not present

## 2020-08-27 DIAGNOSIS — Z992 Dependence on renal dialysis: Secondary | ICD-10-CM | POA: Diagnosis not present

## 2020-08-27 DIAGNOSIS — N2581 Secondary hyperparathyroidism of renal origin: Secondary | ICD-10-CM | POA: Diagnosis not present

## 2020-08-27 DIAGNOSIS — D509 Iron deficiency anemia, unspecified: Secondary | ICD-10-CM | POA: Diagnosis not present

## 2020-08-27 DIAGNOSIS — E876 Hypokalemia: Secondary | ICD-10-CM | POA: Diagnosis not present

## 2020-08-28 DIAGNOSIS — Z79899 Other long term (current) drug therapy: Secondary | ICD-10-CM | POA: Diagnosis not present

## 2020-08-28 DIAGNOSIS — Z992 Dependence on renal dialysis: Secondary | ICD-10-CM | POA: Diagnosis not present

## 2020-08-28 DIAGNOSIS — Z4931 Encounter for adequacy testing for hemodialysis: Secondary | ICD-10-CM | POA: Diagnosis not present

## 2020-08-28 DIAGNOSIS — E440 Moderate protein-calorie malnutrition: Secondary | ICD-10-CM | POA: Diagnosis not present

## 2020-08-28 DIAGNOSIS — E876 Hypokalemia: Secondary | ICD-10-CM | POA: Diagnosis not present

## 2020-08-28 DIAGNOSIS — K7689 Other specified diseases of liver: Secondary | ICD-10-CM | POA: Diagnosis not present

## 2020-08-28 DIAGNOSIS — N2581 Secondary hyperparathyroidism of renal origin: Secondary | ICD-10-CM | POA: Diagnosis not present

## 2020-08-28 DIAGNOSIS — D631 Anemia in chronic kidney disease: Secondary | ICD-10-CM | POA: Diagnosis not present

## 2020-08-28 DIAGNOSIS — N186 End stage renal disease: Secondary | ICD-10-CM | POA: Diagnosis not present

## 2020-08-28 DIAGNOSIS — R82998 Other abnormal findings in urine: Secondary | ICD-10-CM | POA: Diagnosis not present

## 2020-08-28 DIAGNOSIS — D509 Iron deficiency anemia, unspecified: Secondary | ICD-10-CM | POA: Diagnosis not present

## 2020-08-28 DIAGNOSIS — E8770 Fluid overload, unspecified: Secondary | ICD-10-CM | POA: Diagnosis not present

## 2020-08-30 ENCOUNTER — Inpatient Hospital Stay (HOSPITAL_COMMUNITY): Payer: Medicare Other

## 2020-08-30 ENCOUNTER — Encounter (HOSPITAL_COMMUNITY): Payer: Self-pay | Admitting: Internal Medicine

## 2020-08-30 ENCOUNTER — Emergency Department (HOSPITAL_COMMUNITY): Payer: Medicare Other

## 2020-08-30 ENCOUNTER — Inpatient Hospital Stay (HOSPITAL_COMMUNITY)
Admission: EM | Admit: 2020-08-30 | Discharge: 2020-09-04 | DRG: 189 | Disposition: A | Discharge status: Home / Self Care | Payer: Medicare Other | Attending: Internal Medicine | Admitting: Internal Medicine

## 2020-08-30 ENCOUNTER — Telehealth: Payer: Self-pay

## 2020-08-30 ENCOUNTER — Other Ambulatory Visit: Payer: Self-pay

## 2020-08-30 DIAGNOSIS — D638 Anemia in other chronic diseases classified elsewhere: Secondary | ICD-10-CM | POA: Diagnosis present

## 2020-08-30 DIAGNOSIS — I509 Heart failure, unspecified: Secondary | ICD-10-CM

## 2020-08-30 DIAGNOSIS — Z4931 Encounter for adequacy testing for hemodialysis: Secondary | ICD-10-CM | POA: Diagnosis not present

## 2020-08-30 DIAGNOSIS — J90 Pleural effusion, not elsewhere classified: Secondary | ICD-10-CM | POA: Diagnosis not present

## 2020-08-30 DIAGNOSIS — J189 Pneumonia, unspecified organism: Principal | ICD-10-CM

## 2020-08-30 DIAGNOSIS — I340 Nonrheumatic mitral (valve) insufficiency: Secondary | ICD-10-CM | POA: Diagnosis not present

## 2020-08-30 DIAGNOSIS — I5033 Acute on chronic diastolic (congestive) heart failure: Secondary | ICD-10-CM | POA: Diagnosis present

## 2020-08-30 DIAGNOSIS — I5023 Acute on chronic systolic (congestive) heart failure: Secondary | ICD-10-CM | POA: Diagnosis not present

## 2020-08-30 DIAGNOSIS — R0602 Shortness of breath: Secondary | ICD-10-CM | POA: Diagnosis not present

## 2020-08-30 DIAGNOSIS — I5031 Acute diastolic (congestive) heart failure: Secondary | ICD-10-CM | POA: Diagnosis not present

## 2020-08-30 DIAGNOSIS — Z79899 Other long term (current) drug therapy: Secondary | ICD-10-CM

## 2020-08-30 DIAGNOSIS — J810 Acute pulmonary edema: Secondary | ICD-10-CM | POA: Diagnosis not present

## 2020-08-30 DIAGNOSIS — E039 Hypothyroidism, unspecified: Secondary | ICD-10-CM | POA: Diagnosis present

## 2020-08-30 DIAGNOSIS — M898X9 Other specified disorders of bone, unspecified site: Secondary | ICD-10-CM | POA: Diagnosis present

## 2020-08-30 DIAGNOSIS — I083 Combined rheumatic disorders of mitral, aortic and tricuspid valves: Secondary | ICD-10-CM | POA: Diagnosis not present

## 2020-08-30 DIAGNOSIS — I132 Hypertensive heart and chronic kidney disease with heart failure and with stage 5 chronic kidney disease, or end stage renal disease: Secondary | ICD-10-CM | POA: Diagnosis not present

## 2020-08-30 DIAGNOSIS — J9601 Acute respiratory failure with hypoxia: Secondary | ICD-10-CM | POA: Diagnosis not present

## 2020-08-30 DIAGNOSIS — Z8249 Family history of ischemic heart disease and other diseases of the circulatory system: Secondary | ICD-10-CM

## 2020-08-30 DIAGNOSIS — R9431 Abnormal electrocardiogram [ECG] [EKG]: Secondary | ICD-10-CM | POA: Diagnosis not present

## 2020-08-30 DIAGNOSIS — N2581 Secondary hyperparathyroidism of renal origin: Secondary | ICD-10-CM | POA: Diagnosis present

## 2020-08-30 DIAGNOSIS — Z888 Allergy status to other drugs, medicaments and biological substances status: Secondary | ICD-10-CM

## 2020-08-30 DIAGNOSIS — Z87891 Personal history of nicotine dependence: Secondary | ICD-10-CM

## 2020-08-30 DIAGNOSIS — E785 Hyperlipidemia, unspecified: Secondary | ICD-10-CM | POA: Diagnosis not present

## 2020-08-30 DIAGNOSIS — E876 Hypokalemia: Secondary | ICD-10-CM | POA: Diagnosis not present

## 2020-08-30 DIAGNOSIS — I700 Atherosclerosis of aorta: Secondary | ICD-10-CM | POA: Diagnosis not present

## 2020-08-30 DIAGNOSIS — G4733 Obstructive sleep apnea (adult) (pediatric): Secondary | ICD-10-CM | POA: Diagnosis present

## 2020-08-30 DIAGNOSIS — R079 Chest pain, unspecified: Secondary | ICD-10-CM | POA: Diagnosis not present

## 2020-08-30 DIAGNOSIS — R509 Fever, unspecified: Secondary | ICD-10-CM | POA: Diagnosis not present

## 2020-08-30 DIAGNOSIS — Z972 Presence of dental prosthetic device (complete) (partial): Secondary | ICD-10-CM

## 2020-08-30 DIAGNOSIS — R82998 Other abnormal findings in urine: Secondary | ICD-10-CM | POA: Diagnosis not present

## 2020-08-30 DIAGNOSIS — I5043 Acute on chronic combined systolic (congestive) and diastolic (congestive) heart failure: Secondary | ICD-10-CM

## 2020-08-30 DIAGNOSIS — D509 Iron deficiency anemia, unspecified: Secondary | ICD-10-CM | POA: Diagnosis not present

## 2020-08-30 DIAGNOSIS — I110 Hypertensive heart disease with heart failure: Secondary | ICD-10-CM | POA: Diagnosis not present

## 2020-08-30 DIAGNOSIS — N186 End stage renal disease: Secondary | ICD-10-CM | POA: Diagnosis not present

## 2020-08-30 DIAGNOSIS — I5032 Chronic diastolic (congestive) heart failure: Secondary | ICD-10-CM | POA: Diagnosis not present

## 2020-08-30 DIAGNOSIS — B1910 Unspecified viral hepatitis B without hepatic coma: Secondary | ICD-10-CM | POA: Diagnosis present

## 2020-08-30 DIAGNOSIS — I493 Ventricular premature depolarization: Secondary | ICD-10-CM | POA: Diagnosis present

## 2020-08-30 DIAGNOSIS — Z79890 Hormone replacement therapy (postmenopausal): Secondary | ICD-10-CM

## 2020-08-30 DIAGNOSIS — D631 Anemia in chronic kidney disease: Secondary | ICD-10-CM | POA: Diagnosis not present

## 2020-08-30 DIAGNOSIS — I517 Cardiomegaly: Secondary | ICD-10-CM | POA: Diagnosis not present

## 2020-08-30 DIAGNOSIS — B20 Human immunodeficiency virus [HIV] disease: Secondary | ICD-10-CM | POA: Diagnosis present

## 2020-08-30 DIAGNOSIS — R059 Cough, unspecified: Secondary | ICD-10-CM | POA: Diagnosis not present

## 2020-08-30 DIAGNOSIS — Z20822 Contact with and (suspected) exposure to covid-19: Secondary | ICD-10-CM | POA: Diagnosis present

## 2020-08-30 DIAGNOSIS — J811 Chronic pulmonary edema: Secondary | ICD-10-CM | POA: Diagnosis not present

## 2020-08-30 DIAGNOSIS — M109 Gout, unspecified: Secondary | ICD-10-CM | POA: Diagnosis not present

## 2020-08-30 DIAGNOSIS — E440 Moderate protein-calorie malnutrition: Secondary | ICD-10-CM | POA: Diagnosis not present

## 2020-08-30 DIAGNOSIS — Z992 Dependence on renal dialysis: Secondary | ICD-10-CM | POA: Diagnosis not present

## 2020-08-30 DIAGNOSIS — E8770 Fluid overload, unspecified: Secondary | ICD-10-CM | POA: Diagnosis not present

## 2020-08-30 DIAGNOSIS — K7689 Other specified diseases of liver: Secondary | ICD-10-CM | POA: Diagnosis not present

## 2020-08-30 LAB — BASIC METABOLIC PANEL
Anion gap: 13 (ref 5–15)
Anion gap: 13 (ref 5–15)
BUN: 26 mg/dL — ABNORMAL HIGH (ref 6–20)
BUN: 34 mg/dL — ABNORMAL HIGH (ref 6–20)
CO2: 31 mmol/L (ref 22–32)
CO2: 27 mmol/L (ref 22–32)
Calcium: 10.9 mg/dL — ABNORMAL HIGH (ref 8.9–10.3)
Calcium: 10.4 mg/dL — ABNORMAL HIGH (ref 8.9–10.3)
Chloride: 95 mmol/L — ABNORMAL LOW (ref 98–111)
Chloride: 97 mmol/L — ABNORMAL LOW (ref 98–111)
Creatinine, Ser: 4.76 mg/dL — ABNORMAL HIGH (ref 0.61–1.24)
Creatinine, Ser: 5.45 mg/dL — ABNORMAL HIGH (ref 0.61–1.24)
GFR, Estimated: 14 mL/min — ABNORMAL LOW (ref >60)
GFR, Estimated: 12 mL/min — ABNORMAL LOW (ref >60)
Glucose, Bld: 115 mg/dL — ABNORMAL HIGH (ref 70–99)
Glucose, Bld: 125 mg/dL — ABNORMAL HIGH (ref 70–99)
Potassium: 2.8 mmol/L — ABNORMAL LOW (ref 3.5–5.1)
Potassium: 2.9 mmol/L — ABNORMAL LOW (ref 3.5–5.1)
Sodium: 139 mmol/L (ref 135–145)
Sodium: 137 mmol/L (ref 135–145)

## 2020-08-30 LAB — PROCALCITONIN: Procalcitonin: 0.31 ng/mL

## 2020-08-30 LAB — MAGNESIUM: Magnesium: 1.8 mg/dL (ref 1.7–2.4)

## 2020-08-30 LAB — CBC
HCT: 27.4 % — ABNORMAL LOW (ref 39.0–52.0)
Hemoglobin: 9.4 g/dL — ABNORMAL LOW (ref 13.0–17.0)
MCH: 34.1 pg — ABNORMAL HIGH (ref 26.0–34.0)
MCHC: 34.3 g/dL (ref 30.0–36.0)
MCV: 99.3 fL (ref 80.0–100.0)
Platelets: 145 10*3/uL — ABNORMAL LOW (ref 150–400)
RBC: 2.76 MIL/uL — ABNORMAL LOW (ref 4.22–5.81)
RDW: 12.4 % (ref 11.5–15.5)
WBC: 10.5 10*3/uL (ref 4.0–10.5)
nRBC: 0 % (ref 0.0–0.2)

## 2020-08-30 LAB — BRAIN NATRIURETIC PEPTIDE: B Natriuretic Peptide: 1331.1 pg/mL — ABNORMAL HIGH (ref 0.0–100.0)

## 2020-08-30 LAB — HEPATIC FUNCTION PANEL
ALT: 15 U/L (ref 0–44)
AST: 14 U/L — ABNORMAL LOW (ref 15–41)
Albumin: 3.8 g/dL (ref 3.5–5.0)
Alkaline Phosphatase: 52 U/L (ref 38–126)
Bilirubin, Direct: <0.1 mg/dL (ref 0.0–0.2)
Total Bilirubin: 0.7 mg/dL (ref 0.3–1.2)
Total Protein: 7.5 g/dL (ref 6.5–8.1)

## 2020-08-30 LAB — RESPIRATORY PANEL BY RT PCR (FLU A&B, COVID)
Influenza A by PCR: NEGATIVE
Influenza B by PCR: NEGATIVE
SARS Coronavirus 2 by RT PCR: NEGATIVE

## 2020-08-30 LAB — TROPONIN I (HIGH SENSITIVITY): Troponin I (High Sensitivity): 68 ng/L — ABNORMAL HIGH (ref <18)

## 2020-08-30 IMAGING — CT CT CHEST W/O CM
2 of 4 series · 15 of 36 positions shown, 18 images · non-contrast
Comparison: Chest radiograph earlier today

CLINICAL DATA: Pneumonia, unresolved

Worsening chest pain, shortness of breath and cough.
EXAM:
CT CHEST WITHOUT CONTRAST
TECHNIQUE: Multidetector CT imaging of the chest was performed following the
standard protocol without IV contrast.

[Series 4: thorax 2.0 · axial · 0.80mm/px · z∈[+1188,+1486]mm · 12 of 167 slices shown, 15 images]
[im 9/167  mediastinal]
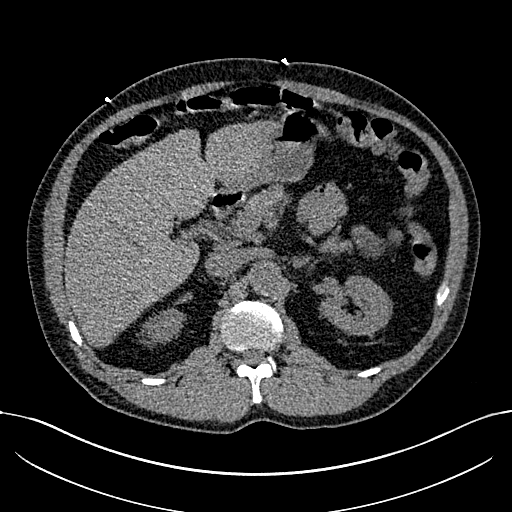
[im 9/167  lung]
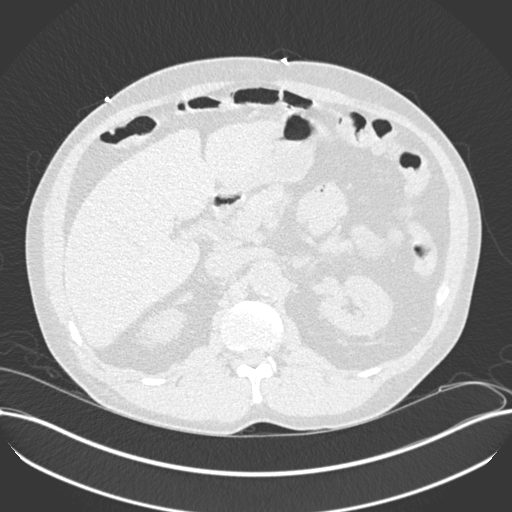
[im 27/167  lung]
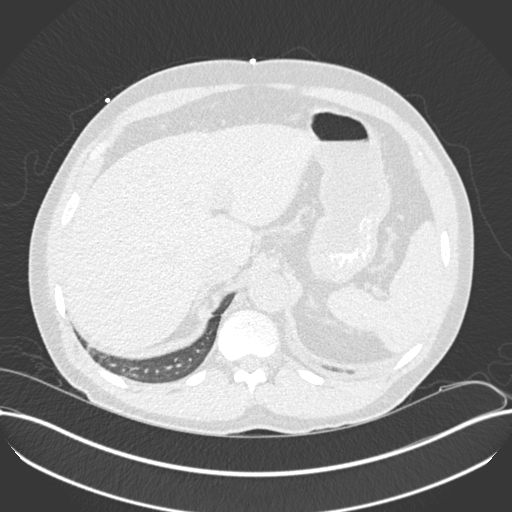
[im 35/167  lung]
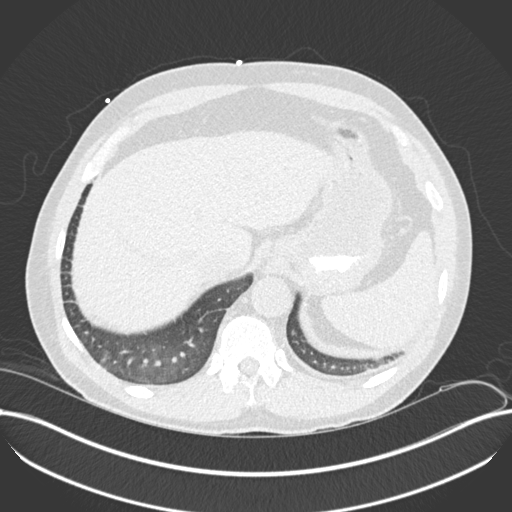
[im 53/167  lung]
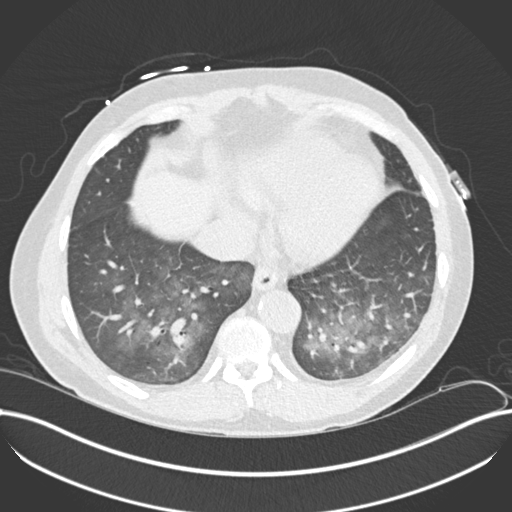
[im 62/167  mediastinal]
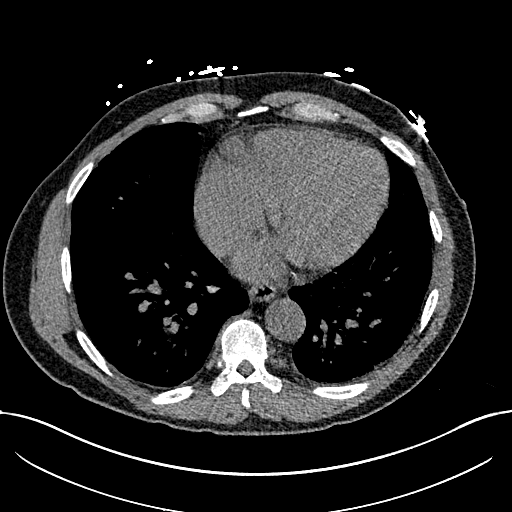
[im 62/167  lung]
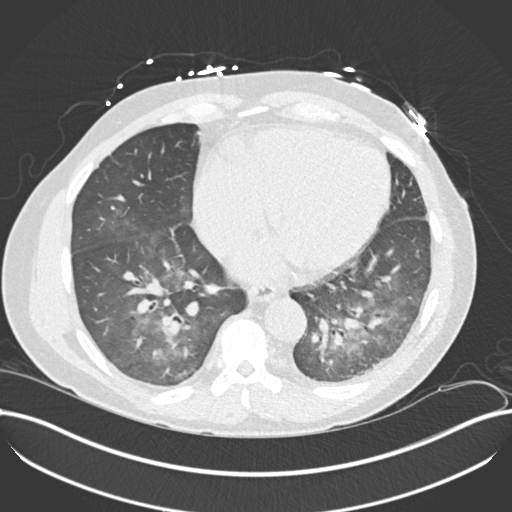
[im 79/167  lung]
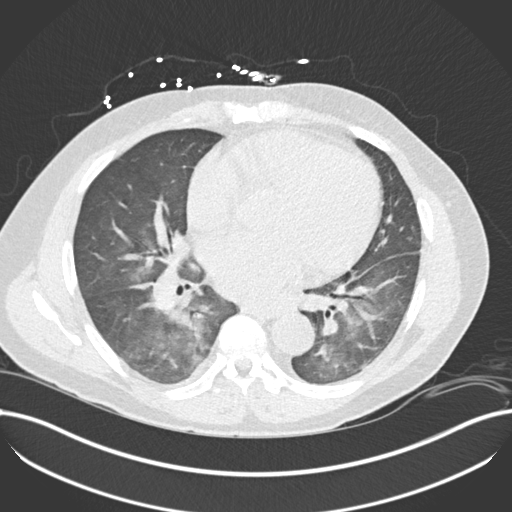
[im 88/167  lung]
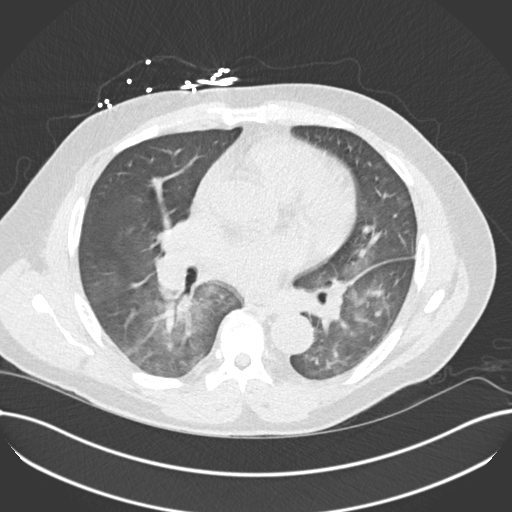
[im 105/167  lung]
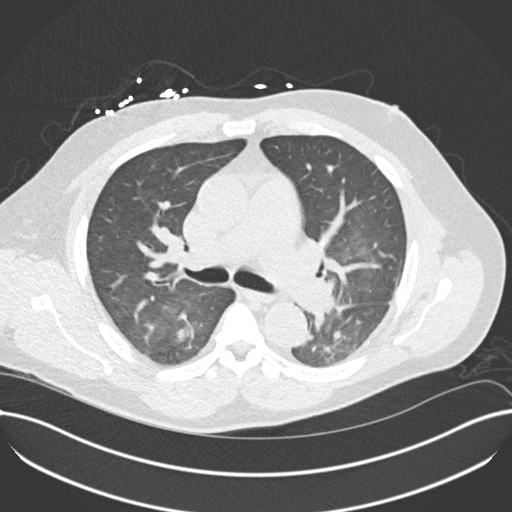
[im 114/167  mediastinal]
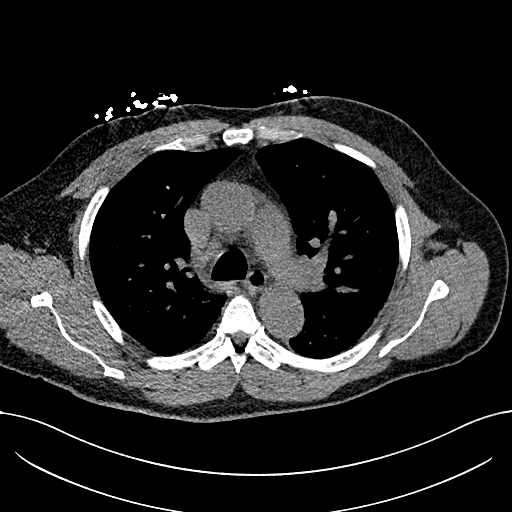
[im 114/167  lung]
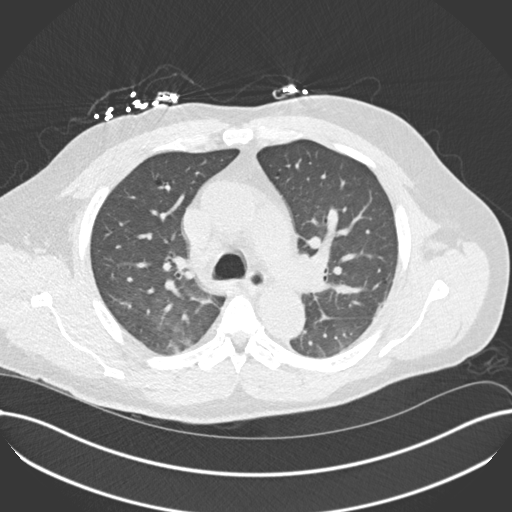
[im 132/167  lung]
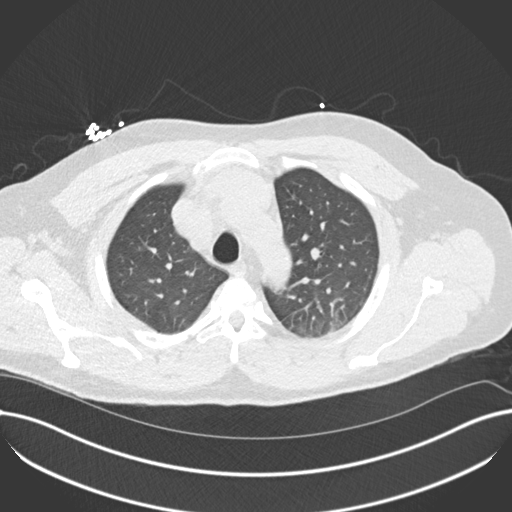
[im 140/167  lung]
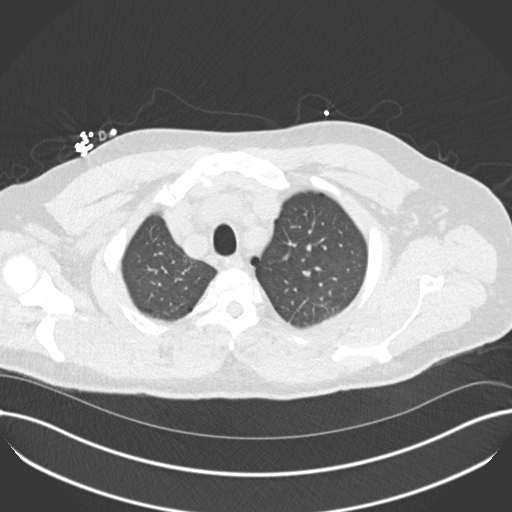
[im 158/167  lung]
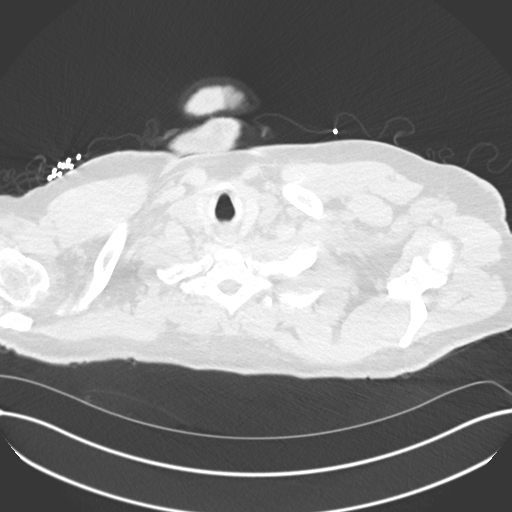

[Series 7: coronal · coronal · 0.65mm/px · 3 of 101 slices shown]
[im 21/101  lung]
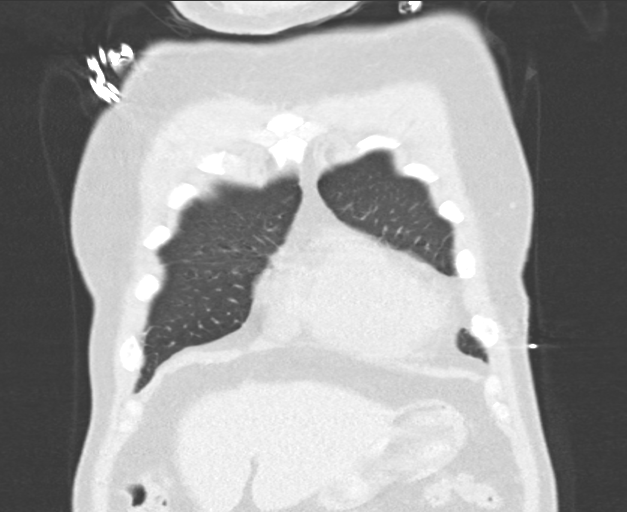
[im 41/101  lung]
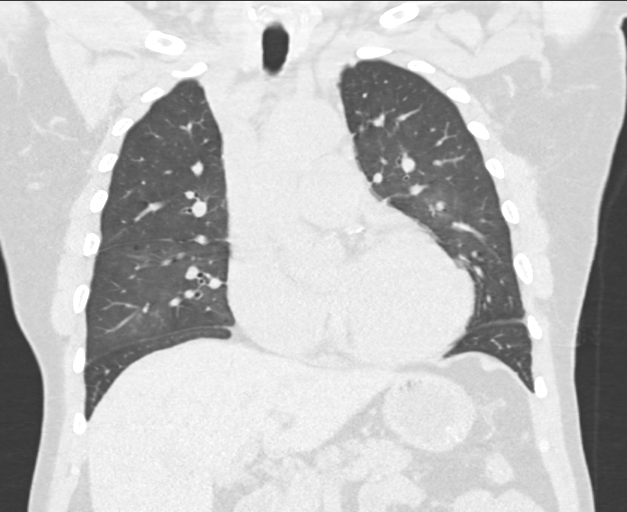
[im 61/101  lung]
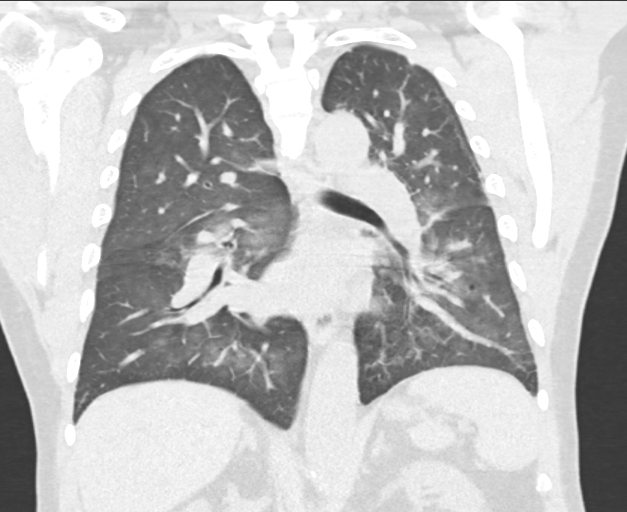

[15 of 36 positions shown; findings below may reference images not displayed]

FINDINGS: Cardiovascular: Cardiomegaly with coronary artery calcifications.
The thoracic aorta is normal in caliber. No pericardial effusion.

Mediastinum/Nodes: Small mediastinal lymph nodes are not enlarged by
size criteria. Limited assessment for hilar adenopathy in the
absence of IV contrast. Tiny hiatal hernia. No esophageal wall
thickening. No suspicious thyroid nodule.

Lungs/Pleura: There are perihilar ground-glass opacities, relatively
symmetric. Areas of smooth septal thickening. There is mild central
bronchial thickening. Overall findings favor pulmonary edema.
Possibility of atypical infection is also considered. No confluent
consolidation. Mild apical emphysema. No pulmonary mass. No
significant pleural effusion.

Upper Abdomen: Cholecystectomy.  No acute findings.

Musculoskeletal: There are no acute or suspicious osseous
abnormalities.
IMPRESSION: 1. Cardiomegaly. Perihilar ground-glass opacities and smooth septal
thickening and mild bronchial thickening. Overall findings favor
pulmonary edema. Possibility of bronchitis and atypical infection is
also considered, but felt less likely.
2. Coronary artery calcifications.

Emphysema ([I4]-[I4]).

## 2020-08-30 IMAGING — DX DG CHEST 2V
2 series · 2 of 2 positions shown · non-contrast
Comparison: [DATE].

CLINICAL DATA: Chest pain.  Shortness of breath.  Cough and fever.

EXAM:
CHEST - 2 VIEW

[w chest pa]
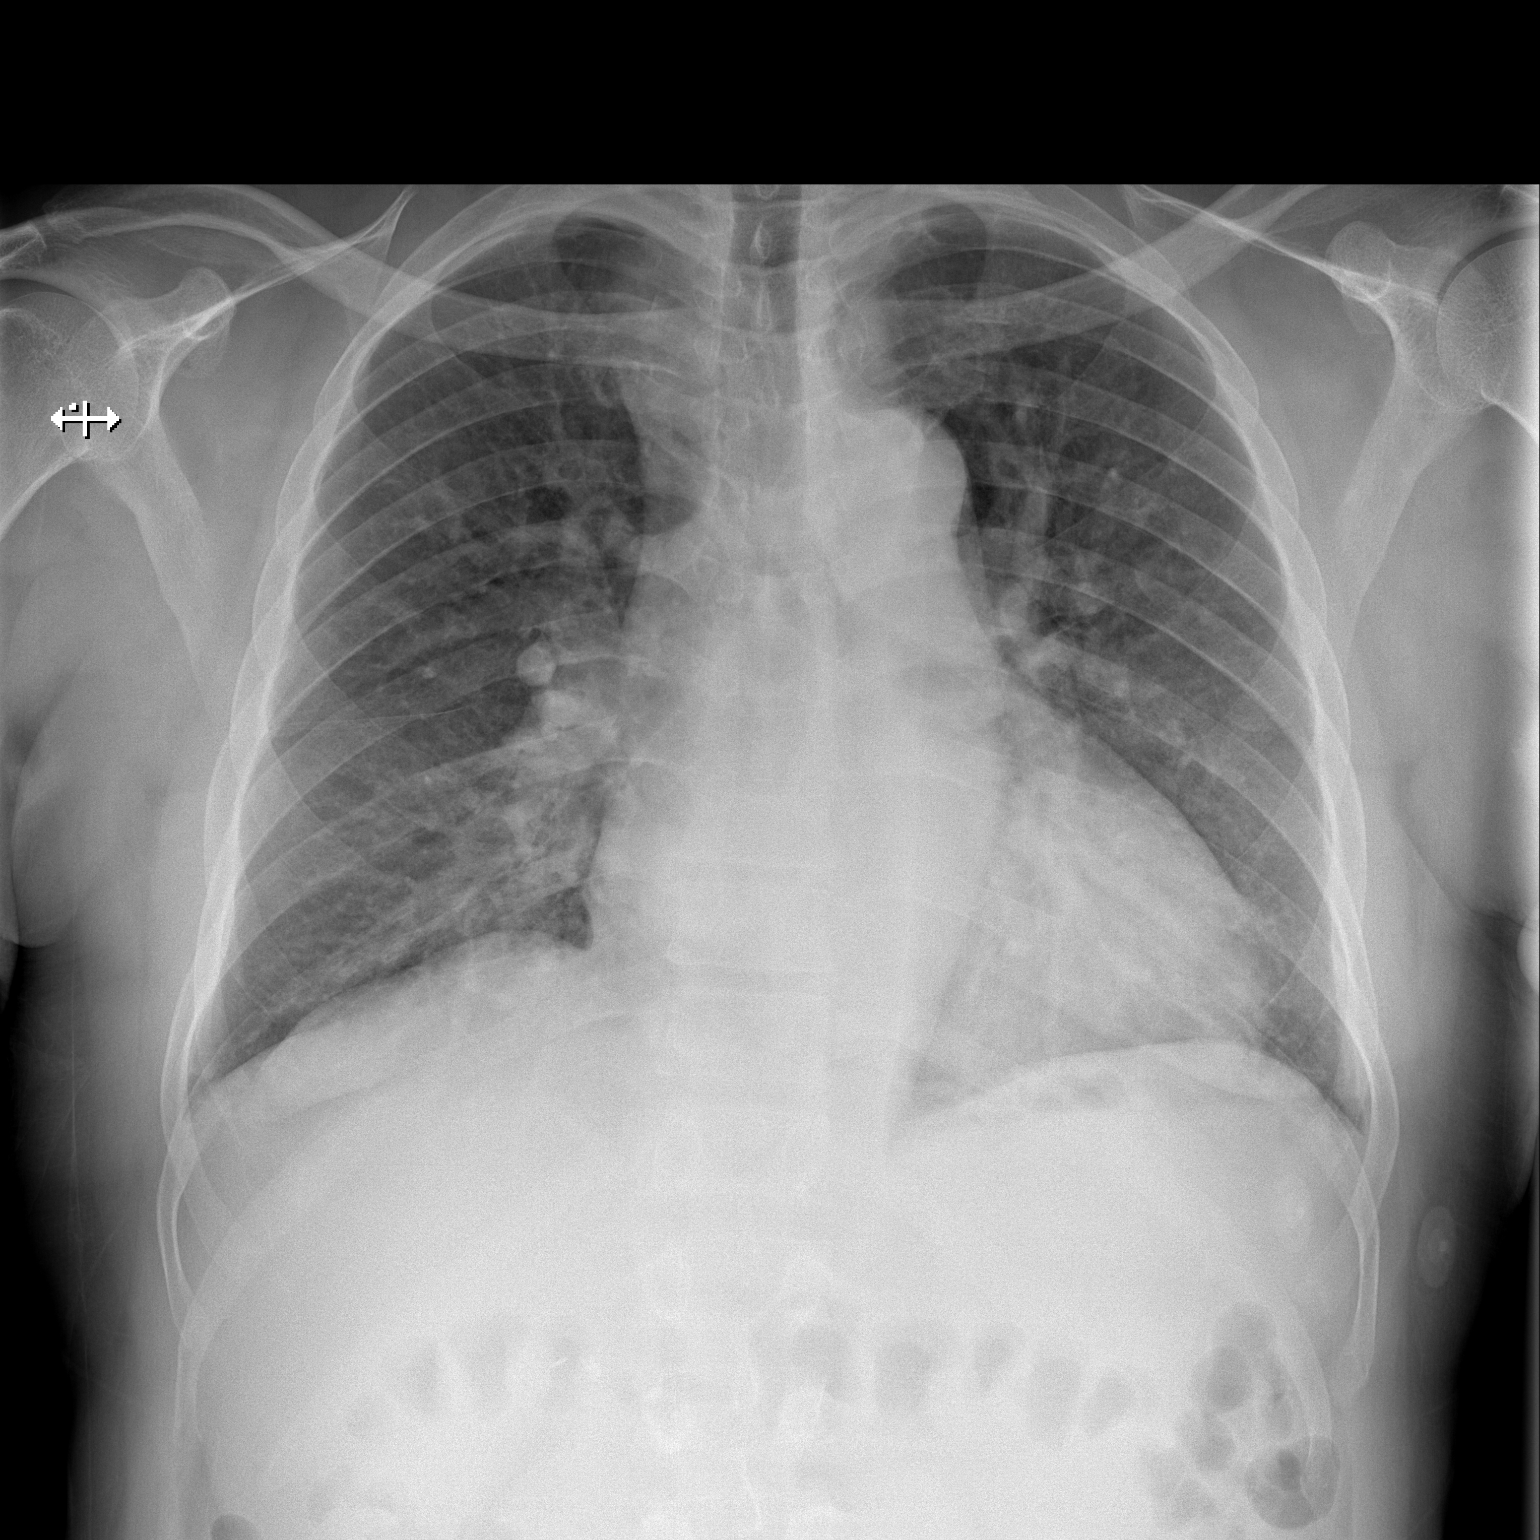

[w chest lat]
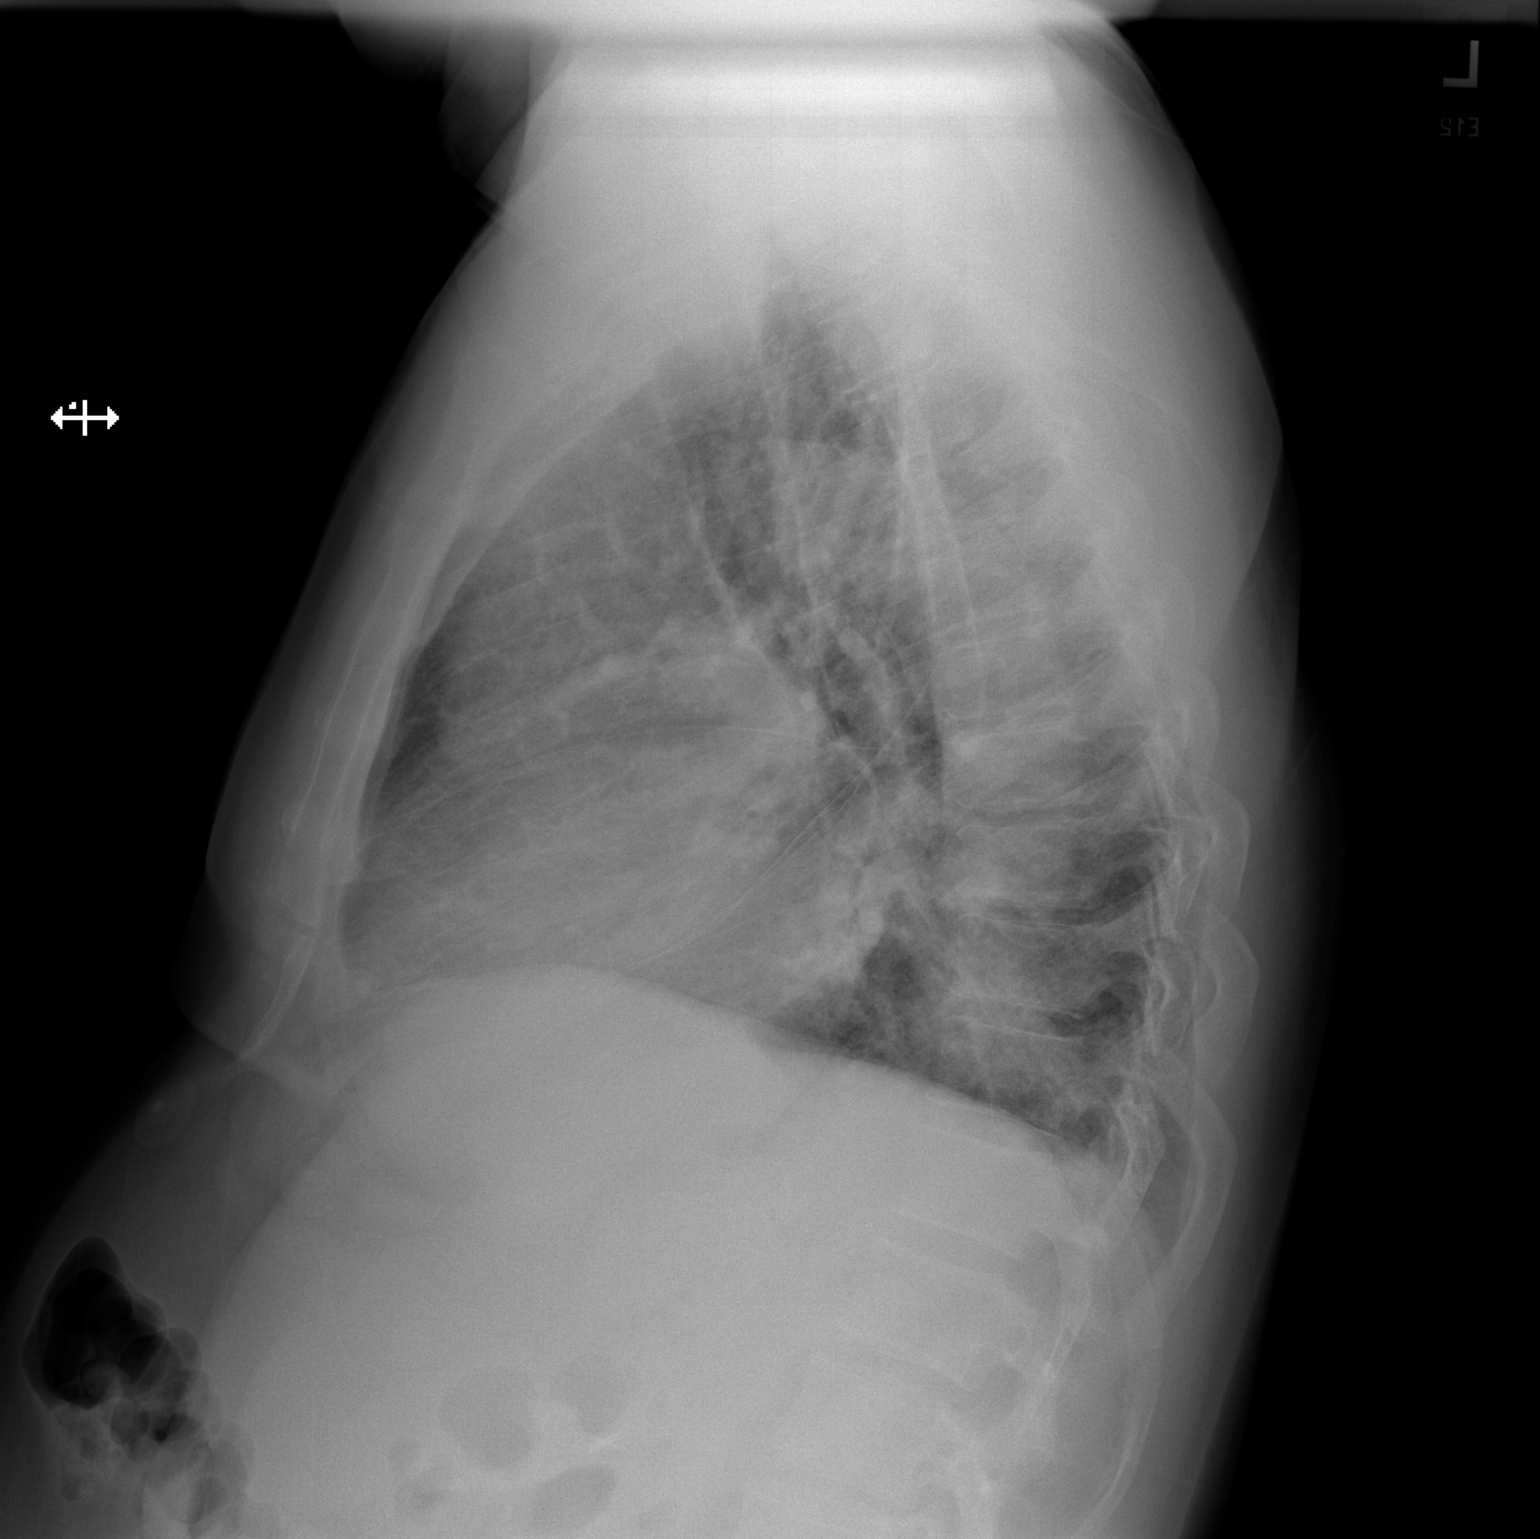

[2 of 2 positions shown; findings below may reference images not displayed]

FINDINGS: Left basilar opacities with apparent "spine sign" on the lateral
radiograph. Vascular congestion without overt pulmonary edema. No
visible pleural effusions or pneumothorax. Enlarged cardiac
silhouette.
IMPRESSION: 1. Left basilar opacities, which may represent atelectasis,
aspiration, and/or pneumonia.
2. Vascular congestion without overt pulmonary edema.
3. Cardiomegaly.

## 2020-08-30 MED ORDER — LAMIVUDINE 100 MG PO TABS: 100.0000 mg | ORAL_TABLET | Freq: Every day | ORAL | Status: DC

## 2020-08-30 MED ORDER — SODIUM CHLORIDE 0.9% FLUSH: 3.0000 mL | INTRAVENOUS | Status: DC | PRN

## 2020-08-30 MED ORDER — POTASSIUM CHLORIDE CRYS ER 20 MEQ PO TBCR
40.0000 meq | EXTENDED_RELEASE_TABLET | Freq: Once | ORAL | Status: AC
  Administered 2020-08-30: 40 meq via ORAL
  Filled 2020-08-30: qty 2

## 2020-08-30 MED ORDER — HEPARIN SODIUM (PORCINE) 5000 UNIT/ML IJ SOLN
5000.0000 [IU] | Freq: Two times a day (BID) | INTRAMUSCULAR | Status: DC
  Administered 2020-08-30 – 2020-09-04 (×9): 5000 [IU] via SUBCUTANEOUS
  Filled 2020-08-30 (×9): qty 1

## 2020-08-30 MED ORDER — AZELASTINE HCL 0.1 % NA SOLN
2.0000 | Freq: Two times a day (BID) | NASAL | Status: DC | PRN
  Filled 2020-08-30: qty 30

## 2020-08-30 MED ORDER — IPRATROPIUM BROMIDE 0.06 % NA SOLN: 2.0000 | Freq: Three times a day (TID) | NASAL | Status: DC

## 2020-08-30 MED ORDER — AMLODIPINE BESYLATE 10 MG PO TABS
10.0000 mg | ORAL_TABLET | Freq: Every day | ORAL | Status: DC
  Administered 2020-08-31 – 2020-09-01 (×2): 10 mg via ORAL
  Filled 2020-08-30 (×2): qty 1

## 2020-08-30 MED ORDER — FAMOTIDINE 20 MG PO TABS: 20.0000 mg | ORAL_TABLET | Freq: Every evening | ORAL | Status: DC

## 2020-08-30 MED ORDER — SODIUM CHLORIDE 0.9 % IV SOLN
1.0000 g | Freq: Once | INTRAVENOUS | Status: AC
  Administered 2020-08-30: 1 g via INTRAVENOUS
  Filled 2020-08-30: qty 10

## 2020-08-30 MED ORDER — RALTEGRAVIR POTASSIUM 400 MG PO TABS
400.0000 mg | ORAL_TABLET | Freq: Two times a day (BID) | ORAL | Status: DC
  Administered 2020-08-30 – 2020-09-03 (×8): 400 mg via ORAL
  Filled 2020-08-30 (×9): qty 1

## 2020-08-30 MED ORDER — OLOPATADINE HCL 0.1 % OP SOLN
1.0000 [drp] | Freq: Every day | OPHTHALMIC | Status: DC
  Administered 2020-08-31 – 2020-09-04 (×5): 1 [drp] via OPHTHALMIC
  Filled 2020-08-30: qty 5

## 2020-08-30 MED ORDER — ONDANSETRON HCL 4 MG/2ML IJ SOLN: 4.0000 mg | Freq: Four times a day (QID) | INTRAMUSCULAR | Status: DC | PRN

## 2020-08-30 MED ORDER — ACETAMINOPHEN 325 MG PO TABS: 650.0000 mg | ORAL_TABLET | ORAL | Status: DC | PRN

## 2020-08-30 MED ORDER — CHLORHEXIDINE GLUCONATE CLOTH 2 % EX PADS
6.0000 | MEDICATED_PAD | Freq: Every day | CUTANEOUS | Status: DC
  Administered 2020-08-31: 6 via TOPICAL

## 2020-08-30 MED ORDER — LEVOTHYROXINE SODIUM 25 MCG PO TABS
25.0000 ug | ORAL_TABLET | Freq: Every day | ORAL | Status: DC
  Administered 2020-08-31 – 2020-09-04 (×5): 25 ug via ORAL
  Filled 2020-08-30 (×5): qty 1

## 2020-08-30 MED ORDER — LAMIVUDINE 10 MG/ML PO SOLN
100.0000 mg | Freq: Every day | ORAL | Status: DC
  Administered 2020-08-31 – 2020-09-02 (×3): 100 mg via ORAL
  Filled 2020-08-30 (×5): qty 10

## 2020-08-30 MED ORDER — GABAPENTIN 300 MG PO CAPS
300.0000 mg | ORAL_CAPSULE | Freq: Two times a day (BID) | ORAL | Status: DC
  Administered 2020-08-30 – 2020-09-04 (×10): 300 mg via ORAL
  Filled 2020-08-30 (×10): qty 1

## 2020-08-30 MED ORDER — SUCROFERRIC OXYHYDROXIDE 500 MG PO CHEW
500.0000 mg | CHEWABLE_TABLET | Freq: Three times a day (TID) | ORAL | Status: DC
  Administered 2020-08-31 – 2020-09-04 (×12): 500 mg via ORAL
  Filled 2020-08-30 (×17): qty 1

## 2020-08-30 MED ORDER — ISOSORBIDE MONONITRATE ER 30 MG PO TB24
30.0000 mg | ORAL_TABLET | Freq: Every day | ORAL | Status: DC
  Administered 2020-08-31 – 2020-09-04 (×4): 30 mg via ORAL
  Filled 2020-08-30 (×4): qty 1

## 2020-08-30 MED ORDER — LUBIPROSTONE 24 MCG PO CAPS
24.0000 ug | ORAL_CAPSULE | Freq: Two times a day (BID) | ORAL | Status: DC
  Administered 2020-08-31 – 2020-09-04 (×7): 24 ug via ORAL
  Filled 2020-08-30 (×11): qty 1

## 2020-08-30 MED ORDER — SODIUM CHLORIDE 0.9% FLUSH
3.0000 mL | Freq: Two times a day (BID) | INTRAVENOUS | Status: DC
  Administered 2020-08-31 – 2020-09-04 (×9): 3 mL via INTRAVENOUS

## 2020-08-30 MED ORDER — ASCORBIC ACID 500 MG PO TABS
1000.0000 mg | ORAL_TABLET | Freq: Every day | ORAL | Status: DC
  Administered 2020-08-31 – 2020-09-04 (×4): 1000 mg via ORAL
  Filled 2020-08-30 (×5): qty 2

## 2020-08-30 MED ORDER — CALCITRIOL 0.5 MCG PO CAPS
0.5000 ug | ORAL_CAPSULE | Freq: Every day | ORAL | Status: DC
  Administered 2020-08-31 – 2020-09-04 (×4): 0.5 ug via ORAL
  Filled 2020-08-30 (×4): qty 1

## 2020-08-30 MED ORDER — MUPIROCIN 2 % EX OINT: 1.0000 "application " | TOPICAL_OINTMENT | Freq: Every day | CUTANEOUS | Status: DC | PRN

## 2020-08-30 MED ORDER — BUDESONIDE 0.25 MG/2ML IN SUSP
0.2500 mg | Freq: Two times a day (BID) | RESPIRATORY_TRACT | Status: DC
  Administered 2020-08-30 – 2020-09-04 (×7): 0.25 mg via RESPIRATORY_TRACT
  Filled 2020-08-30 (×11): qty 2

## 2020-08-30 MED ORDER — ALLOPURINOL 100 MG PO TABS
100.0000 mg | ORAL_TABLET | Freq: Two times a day (BID) | ORAL | Status: DC
  Administered 2020-08-30 – 2020-09-04 (×10): 100 mg via ORAL
  Filled 2020-08-30 (×10): qty 1

## 2020-08-30 MED ORDER — HYDRALAZINE HCL 50 MG PO TABS
100.0000 mg | ORAL_TABLET | Freq: Three times a day (TID) | ORAL | Status: DC
  Administered 2020-08-30 – 2020-09-01 (×4): 100 mg via ORAL
  Filled 2020-08-30 (×4): qty 2

## 2020-08-30 MED ORDER — LACTULOSE 10 GM/15ML PO SOLN
20.0000 g | Freq: Every day | ORAL | Status: DC | PRN
  Filled 2020-08-30: qty 30

## 2020-08-30 MED ORDER — HYOSCYAMINE SULFATE 0.125 MG SL SUBL
0.1250 mg | SUBLINGUAL_TABLET | Freq: Three times a day (TID) | SUBLINGUAL | Status: DC | PRN
  Filled 2020-08-30: qty 1

## 2020-08-30 MED ORDER — CARVEDILOL 25 MG PO TABS
25.0000 mg | ORAL_TABLET | Freq: Two times a day (BID) | ORAL | Status: DC
  Administered 2020-08-31 – 2020-09-04 (×8): 25 mg via ORAL
  Filled 2020-08-30 (×8): qty 1

## 2020-08-30 MED ORDER — RENA-VITE PO TABS
1.0000 | ORAL_TABLET | Freq: Every evening | ORAL | Status: DC
  Administered 2020-08-31 – 2020-09-03 (×4): 1 via ORAL
  Filled 2020-08-30 (×4): qty 1

## 2020-08-30 MED ORDER — SODIUM CHLORIDE 0.9 % IV SOLN
100.0000 mg | Freq: Once | INTRAVENOUS | Status: AC
  Administered 2020-08-30: 100 mg via INTRAVENOUS
  Filled 2020-08-30 (×2): qty 100

## 2020-08-30 MED ORDER — SODIUM CHLORIDE 0.9 % IV SOLN: 250.0000 mL | INTRAVENOUS | Status: DC | PRN

## 2020-08-30 MED ORDER — ABACAVIR SULFATE 300 MG PO TABS
300.0000 mg | ORAL_TABLET | Freq: Every day | ORAL | Status: DC
  Administered 2020-08-31 – 2020-09-02 (×3): 300 mg via ORAL
  Filled 2020-08-30 (×4): qty 1

## 2020-08-30 NOTE — ED Triage Notes (Signed)
Pt reports ongoing and worsening chest pain, shortness of breath, and cough. Seen for same here on 10/20 with negative covid test. Completed full home dialysis tx this morning.

## 2020-08-30 NOTE — ED Provider Notes (Signed)
[facility] EMERGENCY DEPARTMENT Provider Note   CSN: [id number] Arrival date & time: 08/30/20  1057     History Chief Complaint  Patient presents with  . Cough  . Chest Pain  . Shortness of Breath    Albert Eaton is a 54 y.o. male w PMHx CHF, HTN, HIV, CKD on home HD Tu[facility], crohn dz, presenting to the ED with complaint of worsening symptoms of cough, shortness of breath, fever and chest soreness. Patient evaluated on 08/15/2020 in the ED with negative work-up.  States symptoms have been worsening since that time, initially started on October 19.  He has cough that feels wet but is not productive.  He has fever, treated today prior to arrival with Tylenol.  He also endorses worsening shortness of breath that is now constant and significantly worse with any exertion.  He has soreness in his chest from coughing though no chest pain.  He endorses associated decreased appetite with intermittent episodes of nonbloody nonbilious emesis.  He has been having some increased swelling in his legs that is progressive throughout the day, however has been losing weight.  No tobacco use in the last 20 years.    He does hemodialysis at home, and completed his treatment today.  He urinates about 3 times daily.    He has completed the Covid vaccines including the booster.  No known sick contacts.  The history is provided by the patient and medical records.       Past Medical History:  Diagnosis Date  . Anemia   . Aortic atherosclerosis ([place])   . Asthma   . Chronic diastolic CHF (congestive heart failure) ([place]) 01/06/2017   Echo 7/16 [facility] in [place], [name]) Mild AI, mild LAE, mild concentric LVH, EF 55, normal wall motion, mild to moderate MR, mild PI, RVSP 55 // Echo 10/09/16 ([facility]):  Moderate LVH, grade 2 diastolic dysfunction, mild MR, moderate LAE   . CKD (chronic kidney disease)    per pt, waiting on a kidney transplant in near future.  . Esophagitis   . Gout     . Hepatitis    Hep B  . History of nuclear stress test    a. Nuc study 7/16: no scar or ischemia, EF 42 // b. Nuc study 12/17: EF 48, ?small apical ischemia, Low Risk  . HIV (human immunodeficiency virus infection) ([place])   . HLD (hyperlipidemia)   . Hypertension   . Hypertensive heart disease with CHF (congestive heart failure) ([place]) 10/09/2016  . Mitral regurgitation   . OSA (obstructive sleep apnea)   . Post-operative nausea and vomiting    1 time as a child  . Sleep apnea    uses c-pap  . Wears glasses   . Wears partial dentures     Patient Active Problem List   Diagnosis Date Noted  . Lower abdominal pain 06/26/2020  . Pulsatile tinnitus of right ear 11/21/2019  . Kidney failure 11/21/2019  . CHF (congestive heart failure) ([place]) 11/21/2019  . Chills (without fever) 11/07/2019  . Hypercalcemia 10/17/2019  . Dependence on renal dialysis ([place]) 06/06/2019  . Coagulation defect, unspecified ([place]) 05/23/2019  . Iron deficiency anemia, unspecified 05/16/2019  . Anemia in other chronic diseases classified elsewhere 05/13/2019  . Arteriovenous fistula, acquired ([place]) 05/13/2019  . Body mass index (BMI) 28.0-28.9, adult 05/13/2019  . Crohn's disease of small intestine without complications ([place]) 51/70/0174  . Encounter for screening for respiratory tuberculosis 05/13/2019  . Gout, unspecified 05/13/2019  .  Nausea 05/13/2019  . Other fatigue 05/13/2019  . Secondary hyperparathyroidism of renal origin ([place]) 05/13/2019  . End stage renal disease ([place]) 05/13/2019  . Chronic diastolic (congestive) heart failure ([place]) 05/13/2019  . Chronic kidney disease, unspecified 05/13/2019  . Obstructive sleep apnea 05/13/2019  . Discomfort of right ear 02/18/2019  . Chronic kidney disease (CKD), stage IV (severe) ([place]) 06/15/2018  . Hypokalemia 06/13/2018  . Abnormal CT scan, small bowel   . Perennial allergic rhinitis with a predominant nonallergic component 02/09/2018  . Allergic  conjunctivitis 02/09/2018  . Mild intermittent asthma 02/09/2018  . Atherosclerosis 12/09/2017  . Hypothyroidism 11/19/2017  . Mixed hyperlipidemia 10/13/2017  . Personal history of gout 10/13/2017  . S/P cholecystectomy 10/13/2017  . Congestive heart failure with LV diastolic dysfunction, NYHA class 1 ([place]) 10/13/2017  . Hypothyroidism (acquired) 10/13/2017  . 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 SOB (shortness of breath) 06/07/2017  . Arthritis, gouty 01/27/2017  . Chronic diastolic CHF (congestive heart failure) ([place]) 01/06/2017  . OSA (obstructive sleep apnea) 01/06/2017  . Abnormal electrocardiogram (ECG) (EKG) 10/31/2016  . Chest pain 10/09/2016  . HIV disease ([place]) 10/09/2016  . Hypertensive heart disease with CHF (congestive heart failure) ([place]) 10/09/2016  . Chronic kidney disease, stage 4 (severe) ([place]) 05/14/2016  . Left-sided low back pain without sciatica 05/29/2015  . NYHA class 2 heart failure with preserved ejection fraction ([place]) 05/15/2015  . PAH (pulmonary artery hypertension) ([place]) 05/08/2015  . Hypertension 05/04/2015  . Non-rheumatic mitral regurgitation 05/04/2015  . Prolonged Q-T interval on ECG 05/04/2015  . Human immunodeficiency virus (HIV) disease ([place]) 05/04/2015    Past Surgical History:  Procedure Laterality Date  . AV FISTULA PLACEMENT Left 07/02/2018   Procedure: Creation of Left arm BRACHIOBASILIC ARTERIOVENOUS  FISTULA;  Surgeon: Marty Heck, MD;  Location: [facility];  Service: Vascular;  Laterality: Left;  . BASCILIC VEIN TRANSPOSITION Left 08/27/2018   Procedure: Left arm BRACHIOBASILIC VEIN TRANSPOSITION SECOND STAGE;  Surgeon: Marty Heck, MD;  Location: [facility];  Service: Vascular;  Laterality: Left;  . CHOLECYSTECTOMY    . COLONOSCOPY W/ BIOPSIES AND POLYPECTOMY    . GIVENS CAPSULE STUDY N/A 03/01/2018   Procedure: GIVENS CAPSULE STUDY;  Surgeon: Jerene Bears, MD;  Location: [facility];  Service: Gastroenterology;   Laterality: N/A;  . HERNIA REPAIR     As baby  . MULTIPLE TOOTH EXTRACTIONS    . NOSE SURGERY    . RENAL BIOPSY         Family History  Problem Relation Age of Onset  . Heart failure Mother   . Heart attack Maternal Grandmother 53  . Hypertension Sister   . Multiple sclerosis Sister   . Hypertension Brother   . Hypertension Sister   . Scoliosis Other   . Allergic rhinitis Neg Hx   . Angioedema Neg Hx   . Asthma Neg Hx   . Eczema Neg Hx   . Immunodeficiency Neg Hx   . Urticaria Neg Hx     Social History   Tobacco Use  . Smoking status: Former Smoker    Types: Cigarettes    Quit date: 2000    Years since quitting: 21.8  . Smokeless tobacco: Never Used  Vaping Use  . Vaping Use: Never used  Substance Use Topics  . Alcohol use: Not Currently    Alcohol/week: 2.0 standard drinks    Types: 2 Shots of liquor per week    Comment: occ  . Drug  use: No    Home Medications Prior to Admission medications   Medication Sig Start Date End Date Taking? Authorizing Provider  abacavir (ZIAGEN) 300 MG tablet Take 1 tablet (300 mg total) by mouth daily. 06/04/20  Yes Campbell Riches, MD  albuterol (VENTOLIN HFA) 108 (90 Base) MCG/ACT inhaler INHALE 2 PUFFS INTO THE LUNGS EVERY 4 HOURS AS NEEDED FOR WHEEZING OR SHORTNESS OF BREATH Patient taking differently: Inhale 2 puffs into the lungs every 4 (four) hours as needed for wheezing or shortness of breath.  08/13/20  Yes Valentina Shaggy, MD  allopurinol (ZYLOPRIM) 100 MG tablet Take 1 tablet (100 mg total) by mouth 2 (two) times daily. 05/25/20  Yes [name], FNP  amLODipine (NORVASC) 10 MG tablet Take 1 tablet (10 mg total) by mouth daily. 10/31/16  Yes [name], FNP  Ascorbic Acid (VITAMIN C WITH ROSE HIPS) 1000 MG tablet Take 1,000 mg by mouth daily.   Yes [provider]  azelastine (ASTELIN) 0.1 % nasal spray Place 2 sprays into both nostrils 2 (two) times daily as needed for rhinitis. Use in each  nostril as directed Patient taking differently: Place 2 sprays into both nostrils 2 (two) times daily as needed for rhinitis.  05/24/20  Yes Valentina Shaggy, MD  B Complex-C-Zn-Folic Acid (DIALYVITE/ZINC) TABS Take 1 tablet by mouth every evening.  06/14/19  Yes [provider]  calcitRIOL (ROCALTROL) 0.5 MCG capsule Take 0.5 mcg by mouth daily.  04/05/20  Yes [provider]  carvedilol (COREG) 25 MG tablet TAKE 1 TABLET BY MOUTH TWICE DAILY WITH FOOD Patient taking differently: Take 25 mg by mouth 2 (two) times daily with a meal.  08/14/20  Yes [name], MD  famotidine (PEPCID) 20 MG tablet TAKE 1 TABLET(20 MG) BY MOUTH TWICE DAILY Patient taking differently: Take 20 mg by mouth every evening.  02/29/20  Yes [name], MD  fluticasone (FLOVENT HFA) 110 MCG/ACT inhaler Inhale 2 puffs into the lungs 2 (two) times daily. 05/24/20  Yes Valentina Shaggy, MD  gabapentin (NEURONTIN) 300 MG capsule TAKE 1 CAPSULE(300 MG) BY MOUTH THREE TIMES DAILY Patient taking differently: Take 300 mg by mouth 3 (three) times daily. TAKE 1 CAPSULE(300 MG) BY MOUTH THREE TIMES DAILY 05/21/20  Yes [name], FNP  hydrALAZINE (APRESOLINE) 100 MG tablet TAKE 1 TABLET BY MOUTH THREE TIMES DAILY Patient taking differently: Take 100 mg by mouth 3 (three) times daily.  02/01/20  Yes [name], MD  hyoscyamine (LEVSIN SL) 0.125 MG SL tablet Place 1-2 tablets (0.125-0.25 mg total) under the tongue every 8 (eight) hours as needed (lower abdominal pain, spasm). Patient taking differently: Place 0.125-0.25 mg under the tongue every 8 (eight) hours as needed for cramping (lower abdominal pain, spasm).  08/05/19  Yes Pyrtle, Lajuan Lines, MD  ipratropium (ATROVENT) 0.06 % nasal spray USE 2 SPRAYS IN EACH NOSTRIL 2 TO 3 TIMES DAILY AS NEEDED Patient taking differently: Place 2 sprays into both nostrils 3 (three) times daily as needed for rhinitis.  07/30/20  Yes Valentina Shaggy, MD  lactulose  [facility]) 10 GM/15ML solution Take 20 g by mouth daily as needed for moderate constipation.  07/08/20  Yes [provider]  lamivudine (EPIVIR) 100 MG tablet TAKE 1 TABLET(100 MG) BY MOUTH DAILY Patient taking differently: Take 100 mg by mouth daily.  06/04/20  Yes Campbell Riches, MD  levothyroxine (SYNTHROID) 25 MCG tablet Take 1 tablet by mouth daily before breakfast Patient  taking differently: Take 25 mcg by mouth daily before breakfast. Take 1 tablet by mouth daily before breakfast 05/21/20  Yes [name], FNP  lidocaine-prilocaine (EMLA) cream Apply 1 application topically See admin instructions. Apply to access site 1-2 hours before dialysis 06/01/19  Yes [provider]  lubiprostone (AMITIZA) 24 MCG capsule TAKE 1 CAPSULE(24 MCG) BY MOUTH TWICE DAILY WITH A MEAL Patient taking differently: Take 24 mcg by mouth 2 (two) times daily with a meal.  05/29/20  Yes Pyrtle, Lajuan Lines, MD  mupirocin ointment (BACTROBAN) 2 % Apply 1 application topically daily as needed (apply to port after dialysis).  04/24/20  Yes [provider]  Olopatadine HCl (PAZEO) 0.7 % SOLN Place 1 drop into both eyes daily. 05/24/20  Yes Valentina Shaggy, MD  raltegravir (ISENTRESS) 400 MG tablet Take 1 tablet (400 mg total) by mouth 2 (two) times daily. 06/04/20  Yes Campbell Riches, MD  traMADol (ULTRAM) 50 MG tablet Take 1 tablet (50 mg total) by mouth every 6 (six) hours as needed for moderate pain. 08/14/20  Yes Levin Erp, PA  Turmeric 500 MG CAPS Take 500 mg by mouth daily.   Yes [provider]  VELPHORO 500 MG chewable tablet Chew 500 mg by mouth 3 (three) times daily. 04/26/20  Yes [provider]  Vitamin D, Ergocalciferol, (DRISDOL) 1.25 MG (50000 UNIT) CAPS capsule TAKE 1 CAPSULE BY MOUTH EVERY 7 DAYS Patient taking differently: Take 50,000 Units by mouth every Tuesday.  07/11/20  Yes [name], FNP  vitamin E (VITAMIN E) 180 MG (400 UNITS)  capsule Take 400 Units by mouth daily.    Yes [provider]  zinc gluconate 50 MG tablet Take 50 mg by mouth at bedtime.    Yes [provider]  Spacer/Aero-Holding Chambers DEVI 1 Device by Does not apply route 2 (two) times a day. 03/01/19   Bobbitt, Sed[place] Muta, MD    Allergies    Ace inhibitors and Lisinopril  Review of Systems   Review of Systems  Constitutional: Positive for appetite change and fever.  Respiratory: Positive for cough, chest tightness and shortness of breath.   Gastrointestinal: Positive for vomiting. Negative for abdominal pain.  Allergic/Immunologic: Positive for immunocompromised state.  All other systems reviewed and are negative.   Physical Exam Updated Vital Signs BP (!) 153/99   Pulse 88   Temp 99.1 F (37.3 C) (Oral)   Resp (!) 30   Ht 5' 10"  (1.778 m)   Wt 91.2 kg   SpO2 93%   BMI 28.84 kg/m   Physical Exam Vitals and nursing note reviewed.  Constitutional:      Appearance: He is well-developed. He is ill-appearing.  HENT:     Head: Normocephalic and atraumatic.  Eyes:     Conjunctiva/sclera: Conjunctivae normal.  Cardiovascular:     Rate and Rhythm: Normal rate and regular rhythm.  Pulmonary:     Effort: Respiratory distress present.     Comments: Speaking in short sentences with tachypnea.  Appears winded.  Wheezes and coarse crackles in the bases. Abdominal:     General: Bowel sounds are normal.     Palpations: Abdomen is soft.     Tenderness: There is no abdominal tenderness.  Musculoskeletal:     Comments: Trace pretibial edema bilateral lower extremities.  No calf tenderness.  Skin:    General: Skin is warm.  Neurological:     Mental Status: He is alert.  Psychiatric:  Behavior: Behavior normal.     ED Results / Procedures / Treatments   Labs (all labs ordered are listed, but only abnormal results are displayed) Labs Reviewed  BASIC METABOLIC PANEL - Abnormal; Notable for the following  components:      Result Value   Potassium 2.8 (*)    Chloride 95 (*)    Glucose, Bld 115 (*)    BUN 26 (*)    Creatinine, Ser 4.76 (*)    Calcium 10.9 (*)    GFR, Estimated 14 (*)    All other components within normal limits  CBC - Abnormal; Notable for the following components:   RBC 2.76 (*)    Hemoglobin 9.4 (*)    HCT 27.4 (*)    MCH 34.1 (*)    Platelets 145 (*)    All other components within normal limits  HEPATIC FUNCTION PANEL - Abnormal; Notable for the following components:   AST 14 (*)    All other components within normal limits  BRAIN NATRIURETIC PEPTIDE - Abnormal; Notable for the following components:   B Natriuretic Peptide 1,331.1 (*)    All other components within normal limits  TROPONIN I (HIGH SENSITIVITY) - Abnormal; Notable for the following components:   Troponin I (High Sensitivity) 68 (*)    All other components within normal limits  RESPIRATORY PANEL BY RT PCR (FLU A&B, COVID)  CULTURE, BLOOD (ROUTINE X 2)  CULTURE, BLOOD (ROUTINE X 2)  PROCALCITONIN  PROCALCITONIN  BASIC METABOLIC PANEL  BASIC METABOLIC PANEL    EKG EKG Interpretation  Date/Time:  Thursday August 30 2020 11:00:09 EDT Ventricular Rate:  96 PR Interval:  156 QRS Duration: 92 QT Interval:  422 QTC Calculation: 533 R Axis:   57 Text Interpretation: Sinus rhythm with Premature atrial complexes Nonspecific ST abnormality Prolonged QT Abnormal ECG similar to Oct 2021 Confirmed by Sherwood Gambler [phone]) on 08/30/2020 1:27:11 PM   Radiology DG Chest 2 View  Result Date: 08/30/2020 CLINICAL DATA:  Chest pain.  Shortness of breath.  Cough and fever. EXAM: CHEST - 2 VIEW COMPARISON:  08/15/2020. FINDINGS: Left basilar opacities with apparent "spine sign" on the lateral radiograph. Vascular congestion without overt pulmonary edema. No visible pleural effusions or pneumothorax. Enlarged cardiac silhouette. IMPRESSION: 1. Left basilar opacities, which may represent atelectasis,  aspiration, and/or pneumonia. 2. Vascular congestion without overt pulmonary edema. 3. Cardiomegaly. Electronically Signed   By: Margaretha Sheffield MD   On: 08/30/2020 11:43    Procedures Procedures (including critical care time)  Medications Ordered in ED Medications  doxycycline (VIBRAMYCIN) 100 mg in sodium chloride 0.9 % 250 mL IVPB (100 mg Intravenous New Bag/Given 08/30/20 1641)  allopurinol (ZYLOPRIM) tablet 100 mg (has no administration in time range)  abacavir (ZIAGEN) tablet 300 mg (has no administration in time range)  lamivudine (EPIVIR) tablet 100 mg (has no administration in time range)  raltegravir (ISENTRESS) tablet 400 mg (has no administration in time range)  amLODipine (NORVASC) tablet 10 mg (has no administration in time range)  carvedilol (COREG) tablet 25 mg (has no administration in time range)  hydrALAZINE (APRESOLINE) tablet 100 mg (has no administration in time range)  isosorbide mononitrate (IMDUR) 24 hr tablet 30 mg (has no administration in time range)  calcitRIOL (ROCALTROL) capsule 0.5 mcg (has no administration in time range)  levothyroxine (SYNTHROID) tablet 25 mcg (has no administration in time range)  famotidine (PEPCID) tablet 20 mg (has no administration in time range)  hyoscyamine (LEVSIN SL) SL tablet 0.125  mg (has no administration in time range)  lactulose (CHRONULAC) 10 GM/15ML solution 20 g (has no administration in time range)  lubiprostone (AMITIZA) capsule 24 mcg (has no administration in time range)  sucroferric oxyhydroxide (VELPHORO) chewable tablet 500 mg (has no administration in time range)  gabapentin (NEURONTIN) capsule 300 mg (has no administration in time range)  ascorbic acid (VITAMIN C) tablet 1,000 mg (has no administration in time range)  Dialyvite/Zinc TABS 1 tablet (has no administration in time range)  azelastine (ASTELIN) 0.1 % nasal spray 2 spray (has no administration in time range)  budesonide (PULMICORT) nebulizer solution  0.25 mg (has no administration in time range)  mupirocin ointment (BACTROBAN) 2 % 1 application (has no administration in time range)  Olopatadine HCl 0.7 % SOLN 1 drop (has no administration in time range)  sodium chloride flush (NS) 0.9 % injection 3 mL (has no administration in time range)  sodium chloride flush (NS) 0.9 % injection 3 mL (has no administration in time range)  0.9 %  sodium chloride infusion (has no administration in time range)  acetaminophen (TYLENOL) tablet 650 mg (has no administration in time range)  ondansetron (ZOFRAN) injection 4 mg (has no administration in time range)  heparin injection 5,000 Units (has no administration in time range)  cefTRIAXone (ROCEPHIN) 1 g in sodium chloride 0.9 % 100 mL IVPB ( Intravenous Stopped 08/30/20 1617)  potassium chloride SA (KLOR-CON) CR tablet 40 mEq (40 mEq Oral Given 08/30/20 1800)    ED Course  I have reviewed the triage vital signs and the nursing notes.  Pertinent labs & imaging results that were available during my care of the patient were reviewed by me and considered in my medical decision making (see chart for details).    MDM Rules/Calculators/A&P                          Albert Eaton was evaluated in Emergency Department on 08/30/2020 for the symptoms described in the history of present illness. He was evaluated in the context of the global COVID-19 pandemic, which necessitated consideration that the patient might be at risk for infection with the SARS-CoV-2 virus that causes COVID-19. Institutional protocols and algorithms that pertain to the evaluation of patients at risk for COVID-19 are in a state of rapid change based on information released by regulatory bodies including the CDC and federal and state organizations. These policies and algorithms were followed during the patient's care in the ED.  Patient with multiple comorbidities including ESRD on hemodialysis, HIV, hypertension, CHF, presenting with 2 weeks of  worsening cough, shortness of breath, fevers.  He was evaluated on October 20 in the ED, thought to have some mild signs of fluid overload with negative Covid swab.  However symptoms persist, he continues to have fevers, worsening shortness of breath.  He feels significantly dyspneic, even at rest, made worse with any exertion or activity.  Last fever was today prior to arrival, treated with Tylenol.  Has completed Covid vaccines including the booster, no known sick contacts.  On exam, he is ill-appearing.  He has increased respiratory effort, speaking in short sentences with tachypnea.  Lungs with coarse crackles and wheezes bilaterally. Oxygenating at 95% on RA.  chest x-ray reveals bibasilar opacities, new since last ED visit.  Not appear to be fluid overloaded on exam.  Labs with borderline leukocytosis of 10.5.  He does have hypokalemia of 2.8, did complete dialysis today.  He  has been using quite a bit of albuterol over the last few days which may be contributory.  X-ray findings favor pneumonia given presentation with fever, cough, shortness of breath.  Covid swab and flu swabs are negative.  Patient ambulated in the ED with significantly worsening dyspnea, tachypnea, O2 sat today in the ED as low as 90%.  Will admit for further management of community-acquired pneumonia.  IV Rocephin, doxycycline ordered for treatment (has history of QT prolongation, present today on EKG with low potassium, will avoid azithromycin.)  Dr. Roosevelt Locks accepting admission.  Final Clinical Impression(s) / ED Diagnoses Final diagnoses:  Community acquired pneumonia, unspecified laterality    Rx / DC Orders ED Discharge Orders    None       Lasonja Lakins, [place] N, PA-C 08/30/20 1823    Sherwood Gambler, MD 08/31/20 1538

## 2020-08-30 NOTE — Consult Note (Signed)
[facility] Renal Consultation Note    Indication for Consultation:  Management of ESRD/hemodialysis; anemia, hypertension/volume and secondary hyperparathyroidism  HPI: Albert Eaton is a 54 y.o. male with a PMH significant for HTN, HIV on HAART, HLD, chronic diastolic CHF, OSA on CPAP, asthma, and ESRD on home HD (Mon/Tues/Thurs/Friday) who presented to [facility] ED with a 2 week history of worsening SOB, dry cough, and fevers/chills.  In the ED he was found to have a temp of 99.1, BP 153/99, SpO2 93% and ill appearing.  Lab work revealed negative covid-19, K 2.8, BUN 26, Cr 4.76, WBC 10.5, Hgb 9.4, platelets 145, negative respiratory panel.  His CXR was notable for bibasilar infiltrates vs atelectasis.  He was given IV Ceftriaxone and oral doxycycline and was admitted for CAP.  We were consulted to help manage his dialysis and volume status.    He reports that he has also had some anorexia for the past 2 weeks and malaise.  He performed HD at home today but was below his estimated dry weight and only removed 0.3 kg.  He also reports orthopnea (can't lie flat without coughing) but denies any lower extremity edema.  Past Medical History:  Diagnosis Date   Anemia    Aortic atherosclerosis (HCC)    Asthma    Chronic diastolic CHF (congestive heart failure) ([place]) 01/06/2017   Echo 7/16 [facility] in [place], [name]) Mild AI, mild LAE, mild concentric LVH, EF 55, normal wall motion, mild to moderate MR, mild PI, RVSP 55 // Echo 10/09/16 ([facility]):  Moderate LVH, grade 2 diastolic dysfunction, mild MR, moderate LAE    CKD (chronic kidney disease)    per pt, waiting on a kidney transplant in near future.   Esophagitis    Gout    Hepatitis    Hep B   History of nuclear stress test    a. Nuc study 7/16: no scar or ischemia, EF 42 // b. Nuc study 12/17: EF 48, ?small apical ischemia, Low Risk   HIV (human immunodeficiency virus infection) ([place])    HLD (hyperlipidemia)     Hypertension    Hypertensive heart disease with CHF (congestive heart failure) ([place]) 10/09/2016   Mitral regurgitation    OSA (obstructive sleep apnea)    Post-operative nausea and vomiting    1 time as a child   Sleep apnea    uses c-pap   Wears glasses    Wears partial dentures    Past Surgical History:  Procedure Laterality Date   AV FISTULA PLACEMENT Left 07/02/2018   Procedure: Creation of Left arm BRACHIOBASILIC ARTERIOVENOUS  FISTULA;  Surgeon: Marty Heck, MD;  Location: [place];  Service: Vascular;  Laterality: Left;   [place] Left 08/27/2018   Procedure: Left arm BRACHIOBASILIC VEIN TRANSPOSITION SECOND STAGE;  Surgeon: Marty Heck, MD;  Location: [facility] OR;  Service: Vascular;  Laterality: Left;   CHOLECYSTECTOMY     COLONOSCOPY W/ BIOPSIES AND POLYPECTOMY     GIVENS CAPSULE STUDY N/A 03/01/2018   Procedure: GIVENS CAPSULE STUDY;  Surgeon: Jerene Bears, MD;  Location: [place];  Service: Gastroenterology;  Laterality: N/A;   HERNIA REPAIR     As baby   MULTIPLE TOOTH EXTRACTIONS     NOSE SURGERY     RENAL BIOPSY     Family History:   Family History  Problem Relation Age of Onset   Heart failure Mother    Heart attack Maternal Grandmother 63   Hypertension  Sister    Multiple sclerosis Sister    Hypertension Brother    Hypertension Sister    Scoliosis Other    Allergic rhinitis Neg Hx    Angioedema Neg Hx    Asthma Neg Hx    Eczema Neg Hx    Immunodeficiency Neg Hx    Urticaria Neg Hx    Social History:  reports that he quit smoking about 21 years ago. His smoking use included cigarettes. He has never used smokeless tobacco. He reports previous alcohol use of about 2.0 standard drinks of alcohol per week. He reports that he does not use drugs. Allergies  Allergen Reactions   Ace Inhibitors Cough and Other (See Comments)   Lisinopril Cough   Prior to Admission medications   Medication Sig Start  Date End Date Taking? Authorizing Provider  abacavir (ZIAGEN) 300 MG tablet Take 1 tablet (300 mg total) by mouth daily. 06/04/20  Yes Campbell Riches, MD  albuterol (VENTOLIN HFA) 108 (90 Base) MCG/ACT inhaler INHALE 2 PUFFS INTO THE LUNGS EVERY 4 HOURS AS NEEDED FOR WHEEZING OR SHORTNESS OF BREATH Patient taking differently: Inhale 2 puffs into the lungs every 4 (four) hours as needed for wheezing or shortness of breath.  08/13/20  Yes Valentina Shaggy, MD  allopurinol (ZYLOPRIM) 100 MG tablet Take 1 tablet (100 mg total) by mouth 2 (two) times daily. 05/25/20  Yes [name], FNP  amLODipine (NORVASC) 10 MG tablet Take 1 tablet (10 mg total) by mouth daily. 10/31/16  Yes [name], FNP  Ascorbic Acid (VITAMIN C WITH ROSE HIPS) 1000 MG tablet Take 1,000 mg by mouth daily.   Yes [provider]  azelastine (ASTELIN) 0.1 % nasal spray Place 2 sprays into both nostrils 2 (two) times daily as needed for rhinitis. Use in each nostril as directed Patient taking differently: Place 2 sprays into both nostrils 2 (two) times daily as needed for rhinitis.  05/24/20  Yes Valentina Shaggy, MD  B Complex-C-Zn-Folic Acid (DIALYVITE/ZINC) TABS Take 1 tablet by mouth every evening.  06/14/19  Yes [provider]  calcitRIOL (ROCALTROL) 0.5 MCG capsule Take 0.5 mcg by mouth daily.  04/05/20  Yes [provider]  carvedilol (COREG) 25 MG tablet TAKE 1 TABLET BY MOUTH TWICE DAILY WITH FOOD Patient taking differently: Take 25 mg by mouth 2 (two) times daily with a meal.  08/14/20  Yes [name], MD  famotidine (PEPCID) 20 MG tablet TAKE 1 TABLET(20 MG) BY MOUTH TWICE DAILY Patient taking differently: Take 20 mg by mouth every evening.  02/29/20  Yes [name], MD  fluticasone (FLOVENT HFA) 110 MCG/ACT inhaler Inhale 2 puffs into the lungs 2 (two) times daily. 05/24/20  Yes Valentina Shaggy, MD  gabapentin (NEURONTIN) 300 MG capsule TAKE 1 CAPSULE(300 MG) BY  MOUTH THREE TIMES DAILY Patient taking differently: Take 300 mg by mouth 3 (three) times daily. TAKE 1 CAPSULE(300 MG) BY MOUTH THREE TIMES DAILY 05/21/20  Yes [name], FNP  hydrALAZINE (APRESOLINE) 100 MG tablet TAKE 1 TABLET BY MOUTH THREE TIMES DAILY Patient taking differently: Take 100 mg by mouth 3 (three) times daily.  02/01/20  Yes [name], MD  hyoscyamine (LEVSIN SL) 0.125 MG SL tablet Place 1-2 tablets (0.125-0.25 mg total) under the tongue every 8 (eight) hours as needed (lower abdominal pain, spasm). Patient taking differently: Place 0.125-0.25 mg under the tongue every 8 (eight) hours as needed for cramping (lower abdominal pain, spasm).  08/05/19  Yes Pyrtle, Lajuan Lines, MD  ipratropium (ATROVENT) 0.06 % nasal spray USE 2 SPRAYS IN EACH NOSTRIL 2 TO 3 TIMES DAILY AS NEEDED Patient taking differently: Place 2 sprays into both nostrils 3 (three) times daily as needed for rhinitis.  07/30/20  Yes Valentina Shaggy, MD  lactulose [facility]) 10 GM/15ML solution Take 20 g by mouth daily as needed for moderate constipation.  07/08/20  Yes [provider]  lamivudine (EPIVIR) 100 MG tablet TAKE 1 TABLET(100 MG) BY MOUTH DAILY Patient taking differently: Take 100 mg by mouth daily.  06/04/20  Yes Campbell Riches, MD  levothyroxine (SYNTHROID) 25 MCG tablet Take 1 tablet by mouth daily before breakfast Patient taking differently: Take 25 mcg by mouth daily before breakfast. Take 1 tablet by mouth daily before breakfast 05/21/20  Yes [name], FNP  lidocaine-prilocaine (EMLA) cream Apply 1 application topically See admin instructions. Apply to access site 1-2 hours before dialysis 06/01/19  Yes [provider]  lubiprostone (AMITIZA) 24 MCG capsule TAKE 1 CAPSULE(24 MCG) BY MOUTH TWICE DAILY WITH A MEAL Patient taking differently: Take 24 mcg by mouth 2 (two) times daily with a meal.  05/29/20  Yes Pyrtle, Lajuan Lines, MD  mupirocin ointment (BACTROBAN) 2 % Apply 1  application topically daily as needed (apply to port after dialysis).  04/24/20  Yes [provider]  Olopatadine HCl (PAZEO) 0.7 % SOLN Place 1 drop into both eyes daily. 05/24/20  Yes Valentina Shaggy, MD  raltegravir (ISENTRESS) 400 MG tablet Take 1 tablet (400 mg total) by mouth 2 (two) times daily. 06/04/20  Yes Campbell Riches, MD  traMADol (ULTRAM) 50 MG tablet Take 1 tablet (50 mg total) by mouth every 6 (six) hours as needed for moderate pain. 08/14/20  Yes Levin Erp, PA  Turmeric 500 MG CAPS Take 500 mg by mouth daily.   Yes [provider]  VELPHORO 500 MG chewable tablet Chew 500 mg by mouth 3 (three) times daily. 04/26/20  Yes [provider]  Vitamin D, Ergocalciferol, (DRISDOL) 1.25 MG (50000 UNIT) CAPS capsule TAKE 1 CAPSULE BY MOUTH EVERY 7 DAYS Patient taking differently: Take 50,000 Units by mouth every Tuesday.  07/11/20  Yes [name], FNP  vitamin E (VITAMIN E) 180 MG (400 UNITS) capsule Take 400 Units by mouth daily.    Yes [provider]  zinc gluconate 50 MG tablet Take 50 mg by mouth at bedtime.    Yes [provider]  Spacer/Aero-Holding Chambers DEVI 1 Device by Does not apply route 2 (two) times a day. 03/01/19   Bobbitt, [place] Muta, MD   Current Facility-Administered Medications  Medication Dose Route Frequency Provider Last Rate Last Admin   0.9 %  sodium chloride infusion  250 mL Intravenous PRN Wynetta Fines T, MD       [START ON 08/31/2020] abacavir (ZIAGEN) tablet 300 mg  300 mg Oral Daily Wynetta Fines T, MD       acetaminophen (TYLENOL) tablet 650 mg  650 mg Oral Q4H PRN Wynetta Fines T, MD       allopurinol (ZYLOPRIM) tablet 100 mg  100 mg Oral BID Wynetta Fines T, MD       amLODipine (NORVASC) tablet 10 mg  10 mg Oral Daily Lequita Halt, MD       [START ON 08/31/2020] ascorbic acid (VITAMIN C) tablet 1,000 mg  1,000 mg Oral Daily Lequita Halt, MD       azelastine (  ASTELIN) 0.1 % nasal spray 2  spray  2 spray Each Nare BID PRN Wynetta Fines T, MD       budesonide (PULMICORT) nebulizer solution 0.25 mg  0.25 mg Inhalation BID Lequita Halt, MD       [START ON 08/31/2020] calcitRIOL (ROCALTROL) capsule 0.5 mcg  0.5 mcg Oral Daily Wynetta Fines T, MD       carvedilol (COREG) tablet 25 mg  25 mg Oral BID WC Lequita Halt, MD       gabapentin (NEURONTIN) capsule 300 mg  300 mg Oral BID Wynetta Fines T, MD       heparin injection 5,000 Units  5,000 Units Subcutaneous Q12H Wynetta Fines T, MD       hydrALAZINE (APRESOLINE) tablet 100 mg  100 mg Oral TID Wynetta Fines T, MD       hyoscyamine (LEVSIN SL) SL tablet 0.125 mg  0.125 mg Sublingual Q8H PRN Lequita Halt, MD       isosorbide mononitrate (IMDUR) 24 hr tablet 30 mg  30 mg Oral Daily Wynetta Fines T, MD       lactulose (CHRONULAC) 10 GM/15ML solution 20 g  20 g Oral Daily PRN Wynetta Fines T, MD       lamivudine (EPIVIR) tablet 100 mg  100 mg Oral Daily Lequita Halt, MD       [START ON 08/31/2020] levothyroxine (SYNTHROID) tablet 25 mcg  25 mcg Oral Q0600 Lequita Halt, MD       lubiprostone (AMITIZA) capsule 24 mcg  24 mcg Oral BID WC Lequita Halt, MD       [START ON 08/31/2020] multivitamin (RENA-VIT) tablet 1 tablet  1 tablet Oral QPM Lequita Halt, MD       mupirocin ointment (BACTROBAN) 2 % 1 application  1 application Topical Daily PRN Lequita Halt, MD       [START ON 08/31/2020] olopatadine (PATANOL) 0.1 % ophthalmic solution 1 drop  1 drop Both Eyes Daily Wynetta Fines T, MD       raltegravir (ISENTRESS) tablet 400 mg  400 mg Oral BID Wynetta Fines T, MD       sodium chloride flush (NS) 0.9 % injection 3 mL  3 mL Intravenous Q12H Wynetta Fines T, MD       sodium chloride flush (NS) 0.9 % injection 3 mL  3 mL Intravenous PRN Lequita Halt, MD       sucroferric oxyhydroxide [facility]) chewable tablet 500 mg  500 mg Oral TID Lequita Halt, MD       Current Outpatient Medications  Medication Sig Dispense Refill   abacavir (ZIAGEN)  300 MG tablet Take 1 tablet (300 mg total) by mouth daily. 30 tablet 3   albuterol (VENTOLIN HFA) 108 (90 Base) MCG/ACT inhaler INHALE 2 PUFFS INTO THE LUNGS EVERY 4 HOURS AS NEEDED FOR WHEEZING OR SHORTNESS OF BREATH (Patient taking differently: Inhale 2 puffs into the lungs every 4 (four) hours as needed for wheezing or shortness of breath. ) 8.5 g 1   allopurinol (ZYLOPRIM) 100 MG tablet Take 1 tablet (100 mg total) by mouth 2 (two) times daily. 60 tablet 3   amLODipine (NORVASC) 10 MG tablet Take 1 tablet (10 mg total) by mouth daily. 30 tablet 1   Ascorbic Acid (VITAMIN C WITH ROSE HIPS) 1000 MG tablet Take 1,000 mg by mouth daily.     azelastine (ASTELIN) 0.1 % nasal spray Place 2 sprays into both nostrils  2 (two) times daily as needed for rhinitis. Use in each nostril as directed (Patient taking differently: Place 2 sprays into both nostrils 2 (two) times daily as needed for rhinitis. ) 30 mL 5   B Complex-C-Zn-Folic Acid (DIALYVITE/ZINC) TABS Take 1 tablet by mouth every evening.      calcitRIOL (ROCALTROL) 0.5 MCG capsule Take 0.5 mcg by mouth daily.      carvedilol (COREG) 25 MG tablet TAKE 1 TABLET BY MOUTH TWICE DAILY WITH FOOD (Patient taking differently: Take 25 mg by mouth 2 (two) times daily with a meal. ) 60 tablet 5   famotidine (PEPCID) 20 MG tablet TAKE 1 TABLET(20 MG) BY MOUTH TWICE DAILY (Patient taking differently: Take 20 mg by mouth every evening. ) 180 tablet 3   fluticasone (FLOVENT HFA) 110 MCG/ACT inhaler Inhale 2 puffs into the lungs 2 (two) times daily. 1 Inhaler 5   gabapentin (NEURONTIN) 300 MG capsule TAKE 1 CAPSULE(300 MG) BY MOUTH THREE TIMES DAILY (Patient taking differently: Take 300 mg by mouth 3 (three) times daily. TAKE 1 CAPSULE(300 MG) BY MOUTH THREE TIMES DAILY) 90 capsule 3   hydrALAZINE (APRESOLINE) 100 MG tablet TAKE 1 TABLET BY MOUTH THREE TIMES DAILY (Patient taking differently: Take 100 mg by mouth 3 (three) times daily. ) 270 tablet 3    hyoscyamine (LEVSIN SL) 0.125 MG SL tablet Place 1-2 tablets (0.125-0.25 mg total) under the tongue every 8 (eight) hours as needed (lower abdominal pain, spasm). (Patient taking differently: Place 0.125-0.25 mg under the tongue every 8 (eight) hours as needed for cramping (lower abdominal pain, spasm). ) 90 tablet 2   ipratropium (ATROVENT) 0.06 % nasal spray USE 2 SPRAYS IN EACH NOSTRIL 2 TO 3 TIMES DAILY AS NEEDED (Patient taking differently: Place 2 sprays into both nostrils 3 (three) times daily as needed for rhinitis. ) 15 mL 2   lactulose (CHRONULAC) 10 GM/15ML solution Take 20 g by mouth daily as needed for moderate constipation.      lamivudine (EPIVIR) 100 MG tablet TAKE 1 TABLET(100 MG) BY MOUTH DAILY (Patient taking differently: Take 100 mg by mouth daily. ) 30 tablet 3   levothyroxine (SYNTHROID) 25 MCG tablet Take 1 tablet by mouth daily before breakfast (Patient taking differently: Take 25 mcg by mouth daily before breakfast. Take 1 tablet by mouth daily before breakfast) 30 tablet 3   lidocaine-prilocaine (EMLA) cream Apply 1 application topically See admin instructions. Apply to access site 1-2 hours before dialysis     lubiprostone (AMITIZA) 24 MCG capsule TAKE 1 CAPSULE(24 MCG) BY MOUTH TWICE DAILY WITH A MEAL (Patient taking differently: Take 24 mcg by mouth 2 (two) times daily with a meal. ) 60 capsule 3   mupirocin ointment (BACTROBAN) 2 % Apply 1 application topically daily as needed (apply to port after dialysis).      Olopatadine HCl (PAZEO) 0.7 % SOLN Place 1 drop into both eyes daily. 2.5 mL 5   raltegravir (ISENTRESS) 400 MG tablet Take 1 tablet (400 mg total) by mouth 2 (two) times daily. 60 tablet 3   traMADol (ULTRAM) 50 MG tablet Take 1 tablet (50 mg total) by mouth every 6 (six) hours as needed for moderate pain. 15 tablet 0   Turmeric 500 MG CAPS Take 500 mg by mouth daily.     VELPHORO 500 MG chewable tablet Chew 500 mg by mouth 3 (three) times daily.      Vitamin D, Ergocalciferol, (DRISDOL) 1.25 MG (50000 UNIT) CAPS capsule TAKE 1  CAPSULE BY MOUTH EVERY 7 DAYS (Patient taking differently: Take 50,000 Units by mouth every Tuesday. ) 8 capsule 0   vitamin E (VITAMIN E) 180 MG (400 UNITS) capsule Take 400 Units by mouth daily.      zinc gluconate 50 MG tablet Take 50 mg by mouth at bedtime.      Spacer/Aero-Holding Chambers DEVI 1 Device by Does not apply route 2 (two) times a day. 1 each 1   Labs: Basic Metabolic Panel: Recent Labs  Lab 08/30/20 1113  NA 139  K 2.8*  CL 95*  CO2 31  GLUCOSE 115*  BUN 26*  CREATININE 4.76*  CALCIUM 10.9*   Liver Function Tests: Recent Labs  Lab 08/30/20 1402  AST 14*  ALT 15  ALKPHOS 52  BILITOT 0.7  PROT 7.5  ALBUMIN 3.8   No results for input(s): LIPASE, AMYLASE in the last 168 hours. No results for input(s): AMMONIA in the last 168 hours. CBC: Recent Labs  Lab 08/30/20 1113  WBC 10.5  HGB 9.4*  HCT 27.4*  MCV 99.3  PLT 145*   Cardiac Enzymes: No results for input(s): CKTOTAL, CKMB, CKMBINDEX, TROPONINI in the last 168 hours. CBG: No results for input(s): GLUCAP in the last 168 hours. Iron Studies: No results for input(s): IRON, TIBC, TRANSFERRIN, FERRITIN in the last 72 hours. Studies/Results: DG Chest 2 View  Result Date: 08/30/2020 CLINICAL DATA:  Chest pain.  Shortness of breath.  Cough and fever. EXAM: CHEST - 2 VIEW COMPARISON:  08/15/2020. FINDINGS: Left basilar opacities with apparent "spine sign" on the lateral radiograph. Vascular congestion without overt pulmonary edema. No visible pleural effusions or pneumothorax. Enlarged cardiac silhouette. IMPRESSION: 1. Left basilar opacities, which may represent atelectasis, aspiration, and/or pneumonia. 2. Vascular congestion without overt pulmonary edema. 3. Cardiomegaly. Electronically Signed   By: Margaretha Sheffield MD   On: 08/30/2020 11:43   CT CHEST WO CONTRAST  Result Date: 08/30/2020 CLINICAL DATA:  Pneumonia,  unresolved Worsening chest pain, shortness of breath and cough. EXAM: CT CHEST WITHOUT CONTRAST TECHNIQUE: Multidetector CT imaging of the chest was performed following the standard protocol without IV contrast. COMPARISON:  Chest radiograph earlier today FINDINGS: Cardiovascular: Cardiomegaly with coronary artery calcifications. The thoracic aorta is normal in caliber. No pericardial effusion. Mediastinum/Nodes: Small mediastinal lymph nodes are not enlarged by size criteria. Limited assessment for hilar adenopathy in the absence of IV contrast. Tiny hiatal hernia. No esophageal wall thickening. No suspicious thyroid nodule. Lungs/Pleura: There are perihilar ground-glass opacities, relatively symmetric. Areas of smooth septal thickening. There is mild central bronchial thickening. Overall findings favor pulmonary edema. Possibility of atypical infection is also considered. No confluent consolidation. Mild apical emphysema. No pulmonary mass. No significant pleural effusion. Upper Abdomen: Cholecystectomy.  No acute findings. Musculoskeletal: There are no acute or suspicious osseous abnormalities. IMPRESSION: 1. Cardiomegaly. Perihilar ground-glass opacities and smooth septal thickening and mild bronchial thickening. Overall findings favor pulmonary edema. Possibility of bronchitis and atypical infection is also considered, but felt less likely. 2. Coronary artery calcifications. Emphysema (ICD10-J43.9). Electronically Signed   By: Keith Rake M.D.   On: 08/30/2020 19:29    ROS: Pertinent items are noted in HPI. Physical Exam: Vitals:   08/30/20 1815 08/30/20 1830 08/30/20 1845 08/30/20 1930  BP: (!) 143/77 (!) 147/90 (!) 152/89 (!) 156/94  Pulse: 87 92 94 93  Resp: (!) 24 (!) 33 (!) 21 (!) 24  Temp:      TempSrc:      SpO2: 91% 92%  92% 94%  Weight:      Height:          Weight change:   Intake/Output Summary (Last 24 hours) at 08/30/2020 1940 Last data filed at 08/30/2020 1919 Gross per 24  hour  Intake 356.54 ml  Output --  Net 356.54 ml   BP (!) 156/94    Pulse 93    Temp 99.1 F (37.3 C) (Oral)    Resp (!) 24    Ht 5' 10"  (1.778 m)    Wt 91.2 kg    SpO2 94%    BMI 28.84 kg/m  General appearance: fatigued, no distress and ill-appearing Head: Normocephalic, without obvious abnormality, atraumatic Resp: rales bibasilar and wheezes bilaterally Cardio: regular rate and rhythm, S1, S2 normal, no murmur, click, rub or gallop GI: soft, non-tender; bowel sounds normal; no masses,  no organomegaly Extremities: extremities normal, atraumatic, no cyanosis or edema and LUE AVF +T/B Dialysis Access:  Dialysis Orders: Center: Home HD  on Mon/Tues/Thurs/Fri . EDW 91kg HD Bath 2K/45 lactate  Time 3-3.5 hrs Heparin 5000. Access LUE AVF BFR 450 Flow Fraction 66%    Micera 50 mcg Sq every 2 weeks  Venofer  200 mg IVP   Assessment/Plan: 1.  SOB associated with fevers, chills, and cough- likely due to community acquired pneumonia but may have a component of volume overload (since he has not been challenging his dry weight despite anorexia and weight loss).  CT scan revealed cardiomegaly and perihilar ground-glass opacities and smooth septal thickening and mild bronchial thickening favoring pulmonary edema but possible bronchitis and atypical infection also on DDx.   1. Will plan for HD first shift tomorrow since he already had HD today and stable SpO2.  He was started on ceftriaxone and doxy.    2.  ESRD -  On HHD.  Will continue with his schedule and plan for HD tomorrow. 3.  Hypertension/volume  - below edw but CT scan with pulm edema.  Will challenge edw tomorrow and follow BP's. 4.  Anemia  - cont with ESA and follow 5.  Metabolic bone disease -   Continue with outpatient meds 6.  Nutrition - renal diet, low sodium 7. HIV- continue with HAART therapy  Donetta Potts, MD [place] Pager [phone] 08/30/2020, 7:40 PM

## 2020-08-30 NOTE — Telephone Encounter (Signed)
Contacted patient by phone and LVM to contact the office about same day appt.

## 2020-08-30 NOTE — ED Notes (Signed)
Attempted report x1. 

## 2020-08-30 NOTE — ED Notes (Signed)
Ambulated pt w/ pulse oximeter. Pt maintained above 94% and reported increased shob. Pt also started coughing while ambulating. Pt was not coughing while in bed prior to ambulating. Pt reports after getting back in bed his CP is now in his back and sharp. Notified PA.

## 2020-08-30 NOTE — H&P (Signed)
History and Physical    Albert Eaton TKW:[id number] DOB: 1966-10-23 DOA: 08/30/2020  PCP: [name], FNP (Confirm with patient/family/NH records and if not entered, this has to be entered at [facility] point of entry) Patient coming from: Home  I have personally briefly reviewed patient's old medical records in [place]  Chief Complaint: SOB  HPI: Albert Eaton is a 54 y.o. male with medical history significant of ESRD on home visiting HD Monday Tuesday Thursday Friday, refractory HTN, chronic combined systolic and diastolic CHF, HIV on HAART, hypothyroidism, presented with increasing shortness of breath.  His symptoms started about 2 weeks ago, with dry cough and fever of 102.4 Register at home.  Came to the hospital 2 weeks ago, at that time, Covid was ruled out, chest x-ray found fluid overload, significant blood pressure elevation at that time of 164/97, patient was sent to HD center from ED on that day and her symptoms improved after a full session of HD treatment.  However, since then patient has been gradually feeling shortness of breath getting worse, remains to have dry cough.  He has been taking his temperature every now and then and found T-max around 99 ish.  He denies any chest pain, no wheezing, no abdominal pain.  He still makes urine. ED Course: Again noticed significant elevation of blood pressure, systolic 1 92-4 sixties and diastolic upper nineties to low one hundreds.  Lab, potassium 2.8, creatinine 4.7.  QTC 533.  Chest x-ray suspect for bilateral lower field atelectasis versus infiltrates.  Patient was given ceftriaxone plus doxycycline in the ED to treat putative CAP.  Review of Systems: As per HPI otherwise 14 point review of systems negative.    Past Medical History:  Diagnosis Date  . Anemia   . Aortic atherosclerosis ([place])   . Asthma   . Chronic diastolic CHF (congestive heart failure) ([place]) 01/06/2017   Echo 7/16 [facility] in [place], [name]) Mild  AI, mild LAE, mild concentric LVH, EF 55, normal wall motion, mild to moderate MR, mild PI, RVSP 55 // Echo 10/09/16 ([facility]):  Moderate LVH, grade 2 diastolic dysfunction, mild MR, moderate LAE   . CKD (chronic kidney disease)    per pt, waiting on a kidney transplant in near future.  . Esophagitis   . Gout   . Hepatitis    Hep B  . History of nuclear stress test    a. Nuc study 7/16: no scar or ischemia, EF 42 // b. Nuc study 12/17: EF 48, ?small apical ischemia, Low Risk  . HIV (human immunodeficiency virus infection) ([place])   . HLD (hyperlipidemia)   . Hypertension   . Hypertensive heart disease with CHF (congestive heart failure) ([place]) 10/09/2016  . Mitral regurgitation   . OSA (obstructive sleep apnea)   . Post-operative nausea and vomiting    1 time as a child  . Sleep apnea    uses c-pap  . Wears glasses   . Wears partial dentures     Past Surgical History:  Procedure Laterality Date  . AV FISTULA PLACEMENT Left 07/02/2018   Procedure: Creation of Left arm BRACHIOBASILIC ARTERIOVENOUS  FISTULA;  Surgeon: Marty Heck, MD;  Location: [place];  Service: Vascular;  Laterality: Left;  . BASCILIC VEIN TRANSPOSITION Left 08/27/2018   Procedure: Left arm BRACHIOBASILIC VEIN TRANSPOSITION SECOND STAGE;  Surgeon: Marty Heck, MD;  Location: [place];  Service: Vascular;  Laterality: Left;  . CHOLECYSTECTOMY    . COLONOSCOPY W/  BIOPSIES AND POLYPECTOMY    . GIVENS CAPSULE STUDY N/A 03/01/2018   Procedure: GIVENS CAPSULE STUDY;  Surgeon: Jerene Bears, MD;  Location: [place];  Service: Gastroenterology;  Laterality: N/A;  . HERNIA REPAIR     As baby  . MULTIPLE TOOTH EXTRACTIONS    . NOSE SURGERY    . RENAL BIOPSY       reports that he quit smoking about 21 years ago. His smoking use included cigarettes. He has never used smokeless tobacco. He reports previous alcohol use of about 2.0 standard drinks of alcohol per week. He reports that he does not use  drugs.  Allergies  Allergen Reactions  . Ace Inhibitors Cough and Other (See Comments)  . Lisinopril Cough    Family History  Problem Relation Age of Onset  . Heart failure Mother   . Heart attack Maternal Grandmother 53  . Hypertension Sister   . Multiple sclerosis Sister   . Hypertension Brother   . Hypertension Sister   . Scoliosis Other   . Allergic rhinitis Neg Hx   . Angioedema Neg Hx   . Asthma Neg Hx   . Eczema Neg Hx   . Immunodeficiency Neg Hx   . Urticaria Neg Hx      Prior to Admission medications   Medication Sig Start Date End Date Taking? Authorizing Provider  abacavir (ZIAGEN) 300 MG tablet Take 1 tablet (300 mg total) by mouth daily. 06/04/20  Yes Campbell Riches, MD  albuterol (VENTOLIN HFA) 108 (90 Base) MCG/ACT inhaler INHALE 2 PUFFS INTO THE LUNGS EVERY 4 HOURS AS NEEDED FOR WHEEZING OR SHORTNESS OF BREATH Patient taking differently: Inhale 2 puffs into the lungs every 4 (four) hours as needed for wheezing or shortness of breath.  08/13/20  Yes Valentina Shaggy, MD  allopurinol (ZYLOPRIM) 100 MG tablet Take 1 tablet (100 mg total) by mouth 2 (two) times daily. 05/25/20  Yes [name], FNP  amLODipine (NORVASC) 10 MG tablet Take 1 tablet (10 mg total) by mouth daily. 10/31/16  Yes [name], FNP  Ascorbic Acid (VITAMIN C WITH ROSE HIPS) 1000 MG tablet Take 1,000 mg by mouth daily.   Yes [provider]  azelastine (ASTELIN) 0.1 % nasal spray Place 2 sprays into both nostrils 2 (two) times daily as needed for rhinitis. Use in each nostril as directed Patient taking differently: Place 2 sprays into both nostrils 2 (two) times daily as needed for rhinitis.  05/24/20  Yes Valentina Shaggy, MD  B Complex-C-Zn-Folic Acid (DIALYVITE/ZINC) TABS Take 1 tablet by mouth every evening.  06/14/19  Yes [provider]  calcitRIOL (ROCALTROL) 0.5 MCG capsule Take 0.5 mcg by mouth daily.  04/05/20  Yes [provider]   carvedilol (COREG) 25 MG tablet TAKE 1 TABLET BY MOUTH TWICE DAILY WITH FOOD Patient taking differently: Take 25 mg by mouth 2 (two) times daily with a meal.  08/14/20  Yes [name], MD  famotidine (PEPCID) 20 MG tablet TAKE 1 TABLET(20 MG) BY MOUTH TWICE DAILY Patient taking differently: Take 20 mg by mouth every evening.  02/29/20  Yes [name], MD  fluticasone (FLOVENT HFA) 110 MCG/ACT inhaler Inhale 2 puffs into the lungs 2 (two) times daily. 05/24/20  Yes Valentina Shaggy, MD  gabapentin (NEURONTIN) 300 MG capsule TAKE 1 CAPSULE(300 MG) BY MOUTH THREE TIMES DAILY Patient taking differently: Take 300 mg by mouth 3 (three) times daily. TAKE 1 CAPSULE(300 MG) BY MOUTH THREE TIMES  DAILY 05/21/20  Yes [name], FNP  hydrALAZINE (APRESOLINE) 100 MG tablet TAKE 1 TABLET BY MOUTH THREE TIMES DAILY Patient taking differently: Take 100 mg by mouth 3 (three) times daily.  02/01/20  Yes [name], MD  hyoscyamine (LEVSIN SL) 0.125 MG SL tablet Place 1-2 tablets (0.125-0.25 mg total) under the tongue every 8 (eight) hours as needed (lower abdominal pain, spasm). Patient taking differently: Place 0.125-0.25 mg under the tongue every 8 (eight) hours as needed for cramping (lower abdominal pain, spasm).  08/05/19  Yes Pyrtle, Lajuan Lines, MD  ipratropium (ATROVENT) 0.06 % nasal spray USE 2 SPRAYS IN EACH NOSTRIL 2 TO 3 TIMES DAILY AS NEEDED Patient taking differently: Place 2 sprays into both nostrils 3 (three) times daily as needed for rhinitis.  07/30/20  Yes Valentina Shaggy, MD  lactulose [facility]) 10 GM/15ML solution Take 20 g by mouth daily as needed for moderate constipation.  07/08/20  Yes [provider]  lamivudine (EPIVIR) 100 MG tablet TAKE 1 TABLET(100 MG) BY MOUTH DAILY Patient taking differently: Take 100 mg by mouth daily.  06/04/20  Yes Campbell Riches, MD  levothyroxine (SYNTHROID) 25 MCG tablet Take 1 tablet by mouth daily before breakfast Patient taking  differently: Take 25 mcg by mouth daily before breakfast. Take 1 tablet by mouth daily before breakfast 05/21/20  Yes [name], FNP  lidocaine-prilocaine (EMLA) cream Apply 1 application topically See admin instructions. Apply to access site 1-2 hours before dialysis 06/01/19  Yes [provider]  lubiprostone (AMITIZA) 24 MCG capsule TAKE 1 CAPSULE(24 MCG) BY MOUTH TWICE DAILY WITH A MEAL Patient taking differently: Take 24 mcg by mouth 2 (two) times daily with a meal.  05/29/20  Yes Pyrtle, Lajuan Lines, MD  mupirocin ointment (BACTROBAN) 2 % Apply 1 application topically daily as needed (apply to port after dialysis).  04/24/20  Yes [provider]  Olopatadine HCl (PAZEO) 0.7 % SOLN Place 1 drop into both eyes daily. 05/24/20  Yes Valentina Shaggy, MD  raltegravir (ISENTRESS) 400 MG tablet Take 1 tablet (400 mg total) by mouth 2 (two) times daily. 06/04/20  Yes Campbell Riches, MD  traMADol (ULTRAM) 50 MG tablet Take 1 tablet (50 mg total) by mouth every 6 (six) hours as needed for moderate pain. 08/14/20  Yes Levin Erp, PA  Turmeric 500 MG CAPS Take 500 mg by mouth daily.   Yes [provider]  VELPHORO 500 MG chewable tablet Chew 500 mg by mouth 3 (three) times daily. 04/26/20  Yes [provider]  Vitamin D, Ergocalciferol, (DRISDOL) 1.25 MG (50000 UNIT) CAPS capsule TAKE 1 CAPSULE BY MOUTH EVERY 7 DAYS Patient taking differently: Take 50,000 Units by mouth every Tuesday.  07/11/20  Yes [name], FNP  vitamin E (VITAMIN E) 180 MG (400 UNITS) capsule Take 400 Units by mouth daily.    Yes [provider]  zinc gluconate 50 MG tablet Take 50 mg by mouth at bedtime.    Yes [provider]  Spacer/Aero-Holding Chambers DEVI 1 Device by Does not apply route 2 (two) times a day. 03/01/19   BobbittSedalia Muta, MD    Physical Exam: Vitals:   08/30/20 1715 08/30/20 1723 08/30/20 1730 08/30/20 1745  BP: (!) 166/99  (!) 156/99  (!) 153/99  Pulse: 93  86 88  Resp: (!) 30  (!) 31 (!) 30  Temp:  99.1 F (37.3 C)    TempSrc:  Oral  SpO2: 91%  90% 93%  Weight:      Height:        Constitutional: NAD, calm, comfortable Vitals:   08/30/20 1715 08/30/20 1723 08/30/20 1730 08/30/20 1745  BP: (!) 166/99  (!) 156/99 (!) 153/99  Pulse: 93  86 88  Resp: (!) 30  (!) 31 (!) 30  Temp:  99.1 F (37.3 C)    TempSrc:  Oral    SpO2: 91%  90% 93%  Weight:      Height:       Eyes: PERRL, lids and conjunctivae normal ENMT: Mucous membranes are moist. Posterior pharynx clear of any exudate or lesions.Normal dentition.  Neck: normal, supple, no masses, no thyromegaly.  JVD about 7 to 8 cm above the clavicles Respiratory: clear to auscultation bilaterally, bilateral crackles to the mid levels.  Increasing respiratory effort. No accessory muscle use.  Cardiovascular: Regular rate and rhythm, no murmurs / rubs / gallops. No extremity edema. 2+ pedal pulses. No carotid bruits.  Abdomen: no tenderness, no masses palpated. No hepatosplenomegaly. Bowel sounds positive.  Musculoskeletal: no clubbing / cyanosis. No joint deformity upper and lower extremities. Good ROM, no contractures. Normal muscle tone.  Skin: no rashes, lesions, ulcers. No induration Neurologic: CN 2-12 grossly intact. Sensation intact, DTR normal. Strength 5/5 in all 4.  Psychiatric: Normal judgment and insight. Alert and oriented x 3. Normal mood.     Labs on Admission: I have personally reviewed following labs and imaging studies  CBC: Recent Labs  Lab 08/30/20 1113  WBC 10.5  HGB 9.4*  HCT 27.4*  MCV 99.3  PLT 355*   Basic Metabolic Panel: Recent Labs  Lab 08/30/20 1113  NA 139  K 2.8*  CL 95*  CO2 31  GLUCOSE 115*  BUN 26*  CREATININE 4.76*  CALCIUM 10.9*   GFR: Estimated Creatinine Clearance: 20.1 mL/min (A) (by C-G formula based on SCr of 4.76 mg/dL (H)). Liver Function Tests: Recent Labs  Lab 08/30/20 1402  AST 14*  ALT 15   ALKPHOS 52  BILITOT 0.7  PROT 7.5  ALBUMIN 3.8   No results for input(s): LIPASE, AMYLASE in the last 168 hours. No results for input(s): AMMONIA in the last 168 hours. Coagulation Profile: No results for input(s): INR, PROTIME in the last 168 hours. Cardiac Enzymes: No results for input(s): CKTOTAL, CKMB, CKMBINDEX, TROPONINI in the last 168 hours. BNP (last 3 results) No results for input(s): PROBNP in the last 8760 hours. HbA1C: No results for input(s): HGBA1C in the last 72 hours. CBG: No results for input(s): GLUCAP in the last 168 hours. Lipid Profile: No results for input(s): CHOL, HDL, LDLCALC, TRIG, CHOLHDL, LDLDIRECT in the last 72 hours. Thyroid Function Tests: No results for input(s): TSH, T4TOTAL, FREET4, T3FREE, THYROIDAB in the last 72 hours. Anemia Panel: No results for input(s): VITAMINB12, FOLATE, FERRITIN, TIBC, IRON, RETICCTPCT in the last 72 hours. Urine analysis:    Component Value Date/Time   COLORURINE YELLOW 08/14/2020 0408   APPEARANCEUR HAZY (A) 08/14/2020 0408   LABSPEC 1.013 08/14/2020 0408   PHURINE 5.0 08/14/2020 0408   GLUCOSEU NEGATIVE 08/14/2020 0408   HGBUR NEGATIVE 08/14/2020 0408   BILIRUBINUR NEGATIVE 08/14/2020 0408   BILIRUBINUR neg 12/27/2019 0913   KETONESUR NEGATIVE 08/14/2020 0408   PROTEINUR >=300 (A) 08/14/2020 0408   UROBILINOGEN 0.2 12/27/2019 0913   UROBILINOGEN 0.2 01/15/2018 1140   NITRITE NEGATIVE 08/14/2020 0408   LEUKOCYTESUR NEGATIVE 08/14/2020 0408    Radiological Exams on Admission: DG  Chest 2 View  Result Date: 08/30/2020 CLINICAL DATA:  Chest pain.  Shortness of breath.  Cough and fever. EXAM: CHEST - 2 VIEW COMPARISON:  08/15/2020. FINDINGS: Left basilar opacities with apparent "spine sign" on the lateral radiograph. Vascular congestion without overt pulmonary edema. No visible pleural effusions or pneumothorax. Enlarged cardiac silhouette. IMPRESSION: 1. Left basilar opacities, which may represent atelectasis,  aspiration, and/or pneumonia. 2. Vascular congestion without overt pulmonary edema. 3. Cardiomegaly. Electronically Signed   By: Margaretha Sheffield MD   On: 08/30/2020 11:43    EKG: Independently reviewed.  Sinus, prolonged QTC  Assessment/Plan Active Problems:   CHF (congestive heart failure) ([place])  (please populate well all problems here in Problem List. (For example, if patient is on BP meds at home and you resume or decide to hold them, it is a problem that needs to be her. Same for CAD, COPD, HLD and so on)  Acute on chronic combined systolic and diastolic CHF decompensation -Clinically has signs of fluid overload with significant elevation of blood pressure. -Symptom has lasted for more than 2 weeks although with subjective fever and chills at home but with no white count, and no significant infiltrates, low suspicion for pneumonia. -Send CT chest and procalcitonin level. -Discussed with nephrology regarding use of Lasix, however given the significant hypokalemia, will hold Lasix for now. -Repeat echo, most recent echo 7 months as well as stress test showed EF mildly decreased at 45 to 50%, and grade 2 diastolic dysfunction.  Uncontrolled hypertension -This appears to be the major problem for today's hospital visit as well as to 1 2 weeks ago.  Coreg and hydralazine Maxed out.  Start Imdur. -We will discuss with nephrology regarding use ACEI versus ARB.  Severe hypokalemia -Replace and recheck. -Send mag level  Hypothyroidism -Continue Synthroid  DVT prophylaxis: Heparin subcu Code Status: Full code Family Communication: None at bedside Disposition Plan: Expect more than 2 midnight hospital stay to treat CHF, and optimize BP meds Consults called: Nephrology Admission status: Telemetry admission   Lequita Halt MD [facility] Pager [phone]  08/30/2020, 6:52 PM

## 2020-08-31 ENCOUNTER — Inpatient Hospital Stay (HOSPITAL_COMMUNITY): Payer: Medicare Other

## 2020-08-31 DIAGNOSIS — I5031 Acute diastolic (congestive) heart failure: Secondary | ICD-10-CM | POA: Diagnosis not present

## 2020-08-31 DIAGNOSIS — I110 Hypertensive heart disease with heart failure: Secondary | ICD-10-CM

## 2020-08-31 DIAGNOSIS — D638 Anemia in other chronic diseases classified elsewhere: Secondary | ICD-10-CM

## 2020-08-31 DIAGNOSIS — I5023 Acute on chronic systolic (congestive) heart failure: Secondary | ICD-10-CM

## 2020-08-31 DIAGNOSIS — J189 Pneumonia, unspecified organism: Secondary | ICD-10-CM

## 2020-08-31 DIAGNOSIS — N186 End stage renal disease: Secondary | ICD-10-CM | POA: Diagnosis not present

## 2020-08-31 LAB — ECHOCARDIOGRAM COMPLETE
Area-P 1/2: 4.21 cm2
Height: 70 in
MV M vel: 5.93 m/s
MV Peak grad: 140.7 mmHg
Radius: 0.5 cm
S' Lateral: 3.83 cm
Weight: 3058.22 oz

## 2020-08-31 LAB — CBC WITH DIFFERENTIAL/PLATELET
Abs Immature Granulocytes: 0.01 10*3/uL (ref 0.00–0.07)
Basophils Absolute: 0.1 10*3/uL (ref 0.0–0.1)
Basophils Relative: 1 %
Eosinophils Absolute: 0.1 10*3/uL (ref 0.0–0.5)
Eosinophils Relative: 1 %
HCT: 24.8 % — ABNORMAL LOW (ref 39.0–52.0)
Hemoglobin: 8.5 g/dL — ABNORMAL LOW (ref 13.0–17.0)
Immature Granulocytes: 0 %
Lymphocytes Relative: 23 %
Lymphs Abs: 1.7 10*3/uL (ref 0.7–4.0)
MCH: 35 pg — ABNORMAL HIGH (ref 26.0–34.0)
MCHC: 34.3 g/dL (ref 30.0–36.0)
MCV: 102.1 fL — ABNORMAL HIGH (ref 80.0–100.0)
Monocytes Absolute: 0.8 10*3/uL (ref 0.1–1.0)
Monocytes Relative: 11 %
Neutro Abs: 4.7 10*3/uL (ref 1.7–7.7)
Neutrophils Relative %: 64 %
Platelets: 144 10*3/uL — ABNORMAL LOW (ref 150–400)
RBC: 2.43 MIL/uL — ABNORMAL LOW (ref 4.22–5.81)
RDW: 12.5 % (ref 11.5–15.5)
WBC: 7.3 10*3/uL (ref 4.0–10.5)
nRBC: 0 % (ref 0.0–0.2)

## 2020-08-31 LAB — BASIC METABOLIC PANEL
Anion gap: 11 (ref 5–15)
BUN: 34 mg/dL — ABNORMAL HIGH (ref 6–20)
CO2: 26 mmol/L (ref 22–32)
Calcium: 10.2 mg/dL (ref 8.9–10.3)
Chloride: 101 mmol/L (ref 98–111)
Creatinine, Ser: 5.65 mg/dL — ABNORMAL HIGH (ref 0.61–1.24)
GFR, Estimated: 11 mL/min — ABNORMAL LOW (ref >60)
Glucose, Bld: 112 mg/dL — ABNORMAL HIGH (ref 70–99)
Potassium: 2.9 mmol/L — ABNORMAL LOW (ref 3.5–5.1)
Sodium: 138 mmol/L (ref 135–145)

## 2020-08-31 LAB — TROPONIN I (HIGH SENSITIVITY)
Troponin I (High Sensitivity): 69 ng/L — ABNORMAL HIGH (ref <18)
Troponin I (High Sensitivity): 64 ng/L — ABNORMAL HIGH (ref <18)

## 2020-08-31 LAB — PROCALCITONIN
Procalcitonin: 0.34 ng/mL
Procalcitonin: 0.37 ng/mL

## 2020-08-31 LAB — MRSA PCR SCREENING: MRSA by PCR: NEGATIVE

## 2020-08-31 MED ORDER — POTASSIUM CHLORIDE CRYS ER 20 MEQ PO TBCR
20.0000 meq | EXTENDED_RELEASE_TABLET | Freq: Once | ORAL | Status: AC
  Administered 2020-08-31: 20 meq via ORAL
  Filled 2020-08-31: qty 1

## 2020-08-31 MED ORDER — HEPARIN SODIUM (PORCINE) 1000 UNIT/ML IJ SOLN
INTRAMUSCULAR | Status: AC
  Filled 2020-08-31: qty 4

## 2020-08-31 MED ORDER — SODIUM CHLORIDE 0.9 % IV SOLN
2.0000 g | INTRAVENOUS | Status: DC
  Administered 2020-08-31 – 2020-09-03 (×4): 2 g via INTRAVENOUS
  Filled 2020-08-31 (×5): qty 20

## 2020-08-31 MED ORDER — CHLORHEXIDINE GLUCONATE CLOTH 2 % EX PADS
6.0000 | MEDICATED_PAD | Freq: Every day | CUTANEOUS | Status: DC
  Administered 2020-09-01 – 2020-09-04 (×3): 6 via TOPICAL

## 2020-08-31 MED ORDER — NITROGLYCERIN 0.4 MG SL SUBL
SUBLINGUAL_TABLET | SUBLINGUAL | Status: AC
  Administered 2020-08-31: 0.4 mg
  Filled 2020-08-31: qty 1

## 2020-08-31 MED ORDER — PROSOURCE PLUS PO LIQD
30.0000 mL | Freq: Three times a day (TID) | ORAL | Status: DC
  Administered 2020-09-02: 30 mL via ORAL
  Filled 2020-08-31: qty 30

## 2020-08-31 MED ORDER — NEPRO/CARBSTEADY PO LIQD
237.0000 mL | Freq: Two times a day (BID) | ORAL | Status: DC
  Administered 2020-08-31: 237 mL via ORAL

## 2020-08-31 MED ORDER — SODIUM CHLORIDE 0.9 % IV SOLN
500.0000 mg | INTRAVENOUS | Status: DC
  Administered 2020-08-31 – 2020-09-01 (×2): 500 mg via INTRAVENOUS
  Filled 2020-08-31 (×2): qty 500

## 2020-08-31 NOTE — Progress Notes (Signed)
[facility] Progress Note  Subjective: seen on HD, just started, still SOB and coughing  Vitals:   08/31/20 0921 08/31/20 0936 08/31/20 0951 08/31/20 1000  BP: (!) 140/94 131/85 (!) 154/108 (!) 145/94  Pulse:  95 95   Resp: (!) 25 (!) 25 (!) 22   Temp:      TempSrc:      SpO2:  97% 98%   Weight:      Height:        Exam:   alert, nad   no jvd  Chest bilat rales/ rhonchi, no wheezing  Cor reg no RG  Abd soft ntnd no ascites   Ext no LE edema   Alert, NF, ox3   L arm AVF +bruit     OP HD: Home HD M-Tu-Th-Fri  3-3.5h  91kg  2K/45 lactate  Hep 5000  LUE AVF BFR 450  - mircera 50 q2 wk  - venofer 259m IVP   Assessment/ Plan: 1.  SOB -  associated with fevers, chills, and cough- likely due to community acquired pneumonia but may have a component of volume overload (since he has not been challenging his dry weight despite anorexia and weight loss).  CT scan revealed cardiomegaly and perihilar ground-glass opacities and smooth septal thickening and mild bronchial thickening favoring pulmonary edema but possible bronchitis and atypical infection also. Pt started on ceftriaxone and doxy.   Plan dialysis this am, max UF as tolerated.  2. ESRD -  On HHD. HD today. Additional HD tomorrow if needed.  3.  Hypertension/volume  - below edw but CT scan with pulm edema.  Will challenge edw today and follow BP's. 4.  Anemia  - cont with ESA and follow 5.  Metabolic bone disease -   Continue with outpatient meds 6.  Nutrition - renal diet, low sodium 7. HIV- continue with HAART therapy     RKelly Splinter11/02/2020, 10:16 AM   Recent Labs  Lab 08/30/20 1113 08/30/20 1113 08/30/20 2238 08/31/20 0120  K 2.8*   < > 2.9* 2.9*  BUN 26*   < > 34* 34*  CREATININE 4.76*   < > 5.45* 5.65*  CALCIUM 10.9*   < > 10.4* 10.2  HGB 9.4*  --   --   --    < > = values in this interval not displayed.   Inpatient medications: . abacavir  300 mg Oral Daily  . allopurinol  100 mg  Oral BID  . amLODipine  10 mg Oral Daily  . vitamin C with rose hips  1,000 mg Oral Daily  . budesonide  0.25 mg Inhalation BID  . calcitRIOL  0.5 mcg Oral Daily  . carvedilol  25 mg Oral BID WC  . feeding supplement (NEPRO CARB STEADY)  237 mL Oral BID BM  . gabapentin  300 mg Oral BID  . heparin  5,000 Units Subcutaneous Q12H  . heparin sodium (porcine)      . hydrALAZINE  100 mg Oral TID  . isosorbide mononitrate  30 mg Oral Daily  . lamiVUDine  100 mg Oral Daily  . levothyroxine  25 mcg Oral Q0600  . lubiprostone  24 mcg Oral BID WC  . multivitamin  1 tablet Oral QPM  . olopatadine  1 drop Both Eyes Daily  . raltegravir  400 mg Oral BID  . sodium chloride flush  3 mL Intravenous Q12H  . sucroferric oxyhydroxide  500 mg Oral TID   . sodium chloride  sodium chloride, acetaminophen, azelastine, hyoscyamine, lactulose, mupirocin ointment, sodium chloride flush

## 2020-08-31 NOTE — Progress Notes (Signed)
  Echocardiogram 2D Echocardiogram has been attempted. Patient receiving Hemodialysis. Will reattempt at later time.  Merrily Tegeler G Reford Olliff 08/31/2020, 11:01 AM

## 2020-08-31 NOTE — Progress Notes (Addendum)
Chest pain 7/10, EKG done and showed NSR with PVCs  23:49  BP: 155/90 HR: 95 K: 2.9    Nitro given at 0010  Chest pain  6/10 BP 145/97 HR 97 Nitro x2 given 0015  Chest pain 5/10 147/94 96 Nitro x3 given 0020  Chest pain 2/10 BP 145/91 HR 97   MD notified

## 2020-08-31 NOTE — Progress Notes (Signed)
Initial Nutrition Assessment  DOCUMENTATION CODES:   Not applicable  INTERVENTION:   -Continue Nepro Shake po BID, each supplement provides 425 kcal and 19 grams protein -30 ml Prosource Plus TID, each supplement provides 100 kcals and 15 grams protein -Renal MVI daily  NUTRITION DIAGNOSIS:   Increased nutrient needs related to chronic illness (ESRD on HD) as evidenced by estimated needs.  GOAL:   Patient will meet greater than or equal to 90% of their needs  MONITOR:   PO intake, Supplement acceptance, Labs, Weight trends, Skin, I & O's  REASON FOR ASSESSMENT:   Malnutrition Screening Tool    ASSESSMENT:   Mann Skaggs Parthasarathy is a 54 y.o. male with medical history significant of ESRD on home visiting HD Monday Tuesday Thursday Friday, refractory HTN, chronic combined systolic and diastolic CHF, HIV on HAART, hypothyroidism, presented with increasing shortness of breath.  Pt admitted with CHF exacerbation.   Reviewed I/O's: +597 ml x 24 hours  UOP: 0 ml x 24 hours  Pt unavailable at time of visit. No family present to provide additional history.   Reviewed meal completion records; noted meal completion 20%. Pt with increased nutritional needs for ESRD on HD and would greatly benefit from addition of oral nutrition supplements.  Reviewed wt hx; wt has been stable over the past 6 months. Per nephrology notes, EDW 91 kg.   Labs reviewed: K: 2.9.   Diet Order:   Diet Order            Diet renal with fluid restriction Fluid restriction: 1200 mL Fluid; Room service appropriate? Yes; Fluid consistency: Thin  Diet effective now                 EDUCATION NEEDS:   No education needs have been identified at this time  Skin:  Skin Assessment: Reviewed RN Assessment  Last BM:  Unknown  Height:   Ht Readings from Last 1 Encounters:  08/30/20 5' 10"  (1.778 m)    Weight:   Wt Readings from Last 1 Encounters:  08/31/20 86.7 kg    Ideal Body Weight:  75.5  kg  BMI:  Body mass index is 27.43 kg/m.  Estimated Nutritional Needs:   Kcal:  2100-2300  Protein:  115-130 grams  Fluid:  1000 ml + UOP    Loistine Chance, RD, LDN, [place] Registered Dietitian II Certified Diabetes Care and Education Specialist Please refer to [facility] for RD and/or RD on-call/weekend/after hours pager

## 2020-08-31 NOTE — Progress Notes (Signed)
HOSPITAL MEDICINE OVERNIGHT EVENT NOTE    Notified by nursing that patient has been complaining of midsternal chest discomfort.    Patient was administered several doses of sublingual nitroglycerin which took patient's chest discomfort down from a 7/10 to a 3/10 according to nursing.    EKG was obtained revealing sinus arrhythmia at 93 bpm with PVCs.  No dynamic ST segment change noted.  Chart reviewed, patient is currently hospitalized for shortness of breath and concern of acute congestive heart failure.  CT imaging the chest does reveal patchy infiltrates concerning for possible pulmonary edema.  Troponin performed at 11 AM on 11/4 was slightly elevated at 68 which is not surprising considering patient's known history of end-stage renal disease.  Potassium is noted to be 2.9.  Atypical chest discomfort may be secondary to acute congestive heart failure.  That being said, we will cycle cardiac enzymes through the evening, monitor patient on telemetry.  Patient to undergo nephrology evaluation and likely hemodialysis in the morning.  If chest discomfort persists despite removal of fluid via hemodialysis or if troponin trajectory is trending upward cardiology may need to be consulted in the morning.  Due to persisting substantial hypokalemia of 2.9, will provide patient a small dose of 20 mEq of potassium x1 considering known history of end-stage renal disease.  Albert Emerald  MD [facility]

## 2020-08-31 NOTE — Progress Notes (Signed)
  Echocardiogram 2D Echocardiogram has been performed.  Voula Waln G Amiir Heckard 08/31/2020, 1:38 PM

## 2020-08-31 NOTE — Progress Notes (Signed)
[facility] PROGRESS NOTE    Progress Note  Marke Goodwyn Allmendinger  AJG:[id number] DOB: 1966-03-22 DOA: 08/30/2020 PCP: [name], FNP     Brief Narrative:   Art Levan Quesada is an 54 y.o. male past medical history of end-stage renal disease Monday Tuesday Fridays, refractory hypertension, chronic combined diastolic heart failure with a last EF on 02/14/2020 that showed an EF of 35% with diastolic dysfunction grade 2, HIV on [place] therapy comes in for shortness of breath that started about 2 weeks prior to admission with a dry cough and a fever supposedly sent from dialysis center to the ED chest x-ray was done that showed significant fluid overloaded hypertensive 1300 .  Assessment/Plan:   Acute respiratory failure with hypoxia of unclear etiology: He is not in heart failure Is weighing around 89 kg which seems to be lower than his weight going back through the chart he usually ranges around 90-91. BNP is elevated in 1300s ED of the chest shows b/l infiltrate procalcitonin is low yield, influenza and SARS-CoV-2 PCR negative.  Twelve-lead EKG shows no signs of acute coronary syndrome. Nephrology has been consulted for HD. Check a CBC with differential, he has diffuse crackles throughout both lung with a productive cough, appears euvolumic on physical exam at home concerned about community-acquired pneumonia we will start him empirically on Rocephin and azithromycin recheck procalcitonin in the morning.  Uncontrolled hypertension/Hypertensive heart disease with CHF (congestive heart failure) ([place]): This appears to be the major problem of hospitalization Coreg and hydralazine have been maxed out. Blood pressure this morning is ranging 139/99.  Severe hypokalemia: It was replaced orally on admission we will continue correction with hemodialysis.  HIV disease ([place]) Continue current medications.  Anemia in other chronic diseases classified elsewhere Per renal  End stage renal disease  ([place]) Per renal   DVT prophylaxis: lovenox Family Communication:none Status is: Inpatien    Code Status:     Code Status Orders  (From admission, onward)         Start     Ordered   08/30/20 1748  Full code  Continuous        08/30/20 1749        Code Status History    Date Active Date Inactive Code Status Order ID Comments User Context   06/13/2018 2253 06/14/2018 1811 Full Code [id number]  Vianne Bulls, MD ED   06/07/2017 1600 06/08/2017 1600 Full Code [id number]  Maryellen Pile, MD Inpatient   10/09/2016 0506 10/11/2016 1404 Full Code [id number]  Etta Quill, DO Inpatient   Advance Care Planning Activity        IV Access:    Peripheral IV   Procedures and diagnostic studies:   DG Chest 2 View  Result Date: 08/30/2020 CLINICAL DATA:  Chest pain.  Shortness of breath.  Cough and fever. EXAM: CHEST - 2 VIEW COMPARISON:  08/15/2020. FINDINGS: Left basilar opacities with apparent "spine sign" on the lateral radiograph. Vascular congestion without overt pulmonary edema. No visible pleural effusions or pneumothorax. Enlarged cardiac silhouette. IMPRESSION: 1. Left basilar opacities, which may represent atelectasis, aspiration, and/or pneumonia. 2. Vascular congestion without overt pulmonary edema. 3. Cardiomegaly. Electronically Signed   By: Margaretha Sheffield MD   On: 08/30/2020 11:43   CT CHEST WO CONTRAST  Result Date: 08/30/2020 CLINICAL DATA:  Pneumonia, unresolved Worsening chest pain, shortness of breath and cough. EXAM: CT CHEST WITHOUT CONTRAST TECHNIQUE: Multidetector CT imaging of the chest was performed following the standard protocol without  IV contrast. COMPARISON:  Chest radiograph earlier today FINDINGS: Cardiovascular: Cardiomegaly with coronary artery calcifications. The thoracic aorta is normal in caliber. No pericardial effusion. Mediastinum/Nodes: Small mediastinal lymph nodes are not enlarged by size criteria. Limited assessment for hilar adenopathy  in the absence of IV contrast. Tiny hiatal hernia. No esophageal wall thickening. No suspicious thyroid nodule. Lungs/Pleura: There are perihilar ground-glass opacities, relatively symmetric. Areas of smooth septal thickening. There is mild central bronchial thickening. Overall findings favor pulmonary edema. Possibility of atypical infection is also considered. No confluent consolidation. Mild apical emphysema. No pulmonary mass. No significant pleural effusion. Upper Abdomen: Cholecystectomy.  No acute findings. Musculoskeletal: There are no acute or suspicious osseous abnormalities. IMPRESSION: 1. Cardiomegaly. Perihilar ground-glass opacities and smooth septal thickening and mild bronchial thickening. Overall findings favor pulmonary edema. Possibility of bronchitis and atypical infection is also considered, but felt less likely. 2. Coronary artery calcifications. Emphysema (ICD10-J43.9). Electronically Signed   By: Keith Rake M.D.   On: 08/30/2020 19:29     Medical Consultants:    None.  Anti-Infectives:   Rocephin and azithro  Subjective:    Tyress Loden Lank still sob with cough  Objective:    Vitals:   08/30/20 2226 08/30/20 2349 08/31/20 0134 08/31/20 0500  BP: (!) 158/109 (!) 155/90 (!) 142/90 (!) 132/99  Pulse: 97 95 92 95  Resp: 18     Temp: 99.9 F (37.7 C) 98.1 F (36.7 C) 99.8 F (37.7 C) 98.8 F (37.1 C)  TempSrc: Oral Oral Oral Oral  SpO2:  90% 90% 91%  Weight: 89.6 kg   89.7 kg  Height: 5' 10"  (1.778 m)      SpO2: 91 %   Intake/Output Summary (Last 24 hours) at 08/31/2020 0748 Last data filed at 08/31/2020 0600 Gross per 24 hour  Intake 596.54 ml  Output 0 ml  Net 596.54 ml   Filed Weights   08/30/20 1104 08/30/20 2226 08/31/20 0500  Weight: 91.2 kg 89.6 kg 89.7 kg    Exam: General exam: In no acute distress. Respiratory system: Good air movement and crackles Cardiovascular system: S1 & S2 heard, RRR. No JVD, murmurs, rubs, gallops or clicks.   Gastrointestinal system: Abdomen is nondistended, soft and nontender.  Central nervous system: Alert and oriented. No focal neurological deficits. Extremities: No pedal edema. Skin: No rashes, lesions or ulcers Psychiatry: Judgement and insight appear normal. Mood & affect appropriate.    Data Reviewed:    Labs: Basic Metabolic Panel: Recent Labs  Lab 08/30/20 1113 08/30/20 1113 08/30/20 2028 08/30/20 2238 08/31/20 0120  NA 139  --   --  137 138  K 2.8*   < >  --  2.9* 2.9*  CL 95*  --   --  97* 101  CO2 31  --   --  27 26  GLUCOSE 115*  --   --  125* 112*  BUN 26*  --   --  34* 34*  CREATININE 4.76*  --   --  5.45* 5.65*  CALCIUM 10.9*  --   --  10.4* 10.2  MG  --   --  1.8  --   --    < > = values in this interval not displayed.   GFR Estimated Creatinine Clearance: 16.8 mL/min (A) (by C-G formula based on SCr of 5.65 mg/dL (H)). Liver Function Tests: Recent Labs  Lab 08/30/20 1402  AST 14*  ALT 15  ALKPHOS 52  BILITOT 0.7  PROT 7.5  ALBUMIN  3.8   No results for input(s): LIPASE, AMYLASE in the last 168 hours. No results for input(s): AMMONIA in the last 168 hours. Coagulation profile No results for input(s): INR, PROTIME in the last 168 hours. COVID-19 Labs  No results for input(s): DDIMER, FERRITIN, LDH, CRP in the last 72 hours.  Lab Results  Component Value Date   SARSCOV2NAA NEGATIVE 08/30/2020   [place] NEGATIVE 08/15/2020    CBC: Recent Labs  Lab 08/30/20 1113  WBC 10.5  HGB 9.4*  HCT 27.4*  MCV 99.3  PLT 145*   Cardiac Enzymes: No results for input(s): CKTOTAL, CKMB, CKMBINDEX, TROPONINI in the last 168 hours. BNP (last 3 results) No results for input(s): PROBNP in the last 8760 hours. CBG: No results for input(s): GLUCAP in the last 168 hours. D-Dimer: No results for input(s): DDIMER in the last 72 hours. Hgb A1c: No results for input(s): HGBA1C in the last 72 hours. Lipid Profile: No results for input(s): CHOL, HDL,  LDLCALC, TRIG, CHOLHDL, LDLDIRECT in the last 72 hours. Thyroid function studies: No results for input(s): TSH, T4TOTAL, T3FREE, THYROIDAB in the last 72 hours.  Invalid input(s): FREET3 Anemia work up: No results for input(s): VITAMINB12, FOLATE, FERRITIN, TIBC, IRON, RETICCTPCT in the last 72 hours. Sepsis Labs: Recent Labs  Lab 08/30/20 1113 08/30/20 2028 08/31/20 0120  PROCALCITON  --  0.31 0.34  WBC 10.5  --   --    Microbiology Recent Results (from the past 240 hour(s))  Respiratory Panel by RT PCR (Flu A&B, Covid) - Nasopharyngeal Swab     Status: None   Collection Time: 08/30/20  2:02 PM   Specimen: Nasopharyngeal Swab  Result Value Ref Range Status   SARS Coronavirus 2 by RT PCR NEGATIVE NEGATIVE Final    Comment: (NOTE) SARS-CoV-2 target nucleic acids are NOT DETECTED.  The SARS-CoV-2 RNA is generally detectable in upper respiratoy specimens during the acute phase of infection. The lowest concentration of SARS-CoV-2 viral copies this assay can detect is 131 copies/mL. A negative result does not preclude SARS-Cov-2 infection and should not be used as the sole basis for treatment or other patient management decisions. A negative result may occur with  improper specimen collection/handling, submission of specimen other than nasopharyngeal swab, presence of viral mutation(s) within the areas targeted by this assay, and inadequate number of viral copies (<131 copies/mL). A negative result must be combined with clinical observations, patient history, and epidemiological information. The expected result is Negative.  Fact Sheet for Patients:  PinkCheek.be  Fact Sheet for Healthcare Providers:  GravelBags.it  This test is no t yet approved or cleared by the [place] FDA and  has been authorized for detection and/or diagnosis of SARS-CoV-2 by FDA under an Emergency Use Authorization (EUA). This EUA will  remain  in effect (meaning this test can be used) for the duration of the COVID-19 declaration under Section 564(b)(1) of the Act, 21 U.S.C. section 360bbb-3(b)(1), unless the authorization is terminated or revoked sooner.     Influenza A by PCR NEGATIVE NEGATIVE Final   Influenza B by PCR NEGATIVE NEGATIVE Final    Comment: (NOTE) The Xpert Xpress SARS-CoV-2/FLU/RSV assay is intended as an aid in  the diagnosis of influenza from Nasopharyngeal swab specimens and  should not be used as a sole basis for treatment. Nasal washings and  aspirates are unacceptable for Xpert Xpress SARS-CoV-2/FLU/RSV  testing.  Fact Sheet for Patients: PinkCheek.be  Fact Sheet for Healthcare Providers: GravelBags.it  This test is not  yet approved or cleared by the [place] and  has been authorized for detection and/or diagnosis of SARS-CoV-2 by  FDA under an Emergency Use Authorization (EUA). This EUA will remain  in effect (meaning this test can be used) for the duration of the  Covid-19 declaration under Section 564(b)(1) of the Act, 21  U.S.C. section 360bbb-3(b)(1), unless the authorization is  terminated or revoked. Performed at [facility], [place] [address] [place], [place] [address]   Culture, blood (routine x 2)     Status: None (Preliminary result)   Collection Time: 08/30/20  2:02 PM   Specimen: BLOOD  Result Value Ref Range Status   Specimen Description BLOOD BLOOD RIGHT HAND  Final   Special Requests   Final    BOTTLES DRAWN AEROBIC AND ANAEROBIC Blood Culture adequate volume   Culture   Final    NO GROWTH < 24 HOURS Performed at [facility], [place] [address] [place], [place] [address]    Report Status PENDING  Incomplete  Culture, blood (routine x 2)     Status: None (Preliminary result)   Collection Time: 08/30/20  2:02 PM   Specimen: BLOOD  Result Value Ref Range Status   Specimen Description BLOOD  BLOOD RIGHT HAND  Final   Special Requests   Final    BOTTLES DRAWN AEROBIC AND ANAEROBIC Blood Culture results may not be optimal due to an inadequate volume of blood received in culture bottles   Culture   Final    NO GROWTH < 24 HOURS Performed at [facility], [place] [address], [place], [place] [address]    Report Status PENDING  Incomplete  MRSA PCR Screening     Status: None   Collection Time: 08/31/20  1:28 AM   Specimen: Nasal Mucosa; Nasopharyngeal  Result Value Ref Range Status   MRSA by PCR NEGATIVE NEGATIVE Final    Comment:        The GeneXpert MRSA Assay (FDA approved for NASAL specimens only), is one component of a comprehensive MRSA colonization surveillance program. It is not intended to diagnose MRSA infection nor to guide or monitor treatment for MRSA infections. Performed at [facility], [place] [address] [place], [place] [address]      Medications:   . abacavir  300 mg Oral Daily  . allopurinol  100 mg Oral BID  . amLODipine  10 mg Oral Daily  . vitamin C with rose hips  1,000 mg Oral Daily  . budesonide  0.25 mg Inhalation BID  . calcitRIOL  0.5 mcg Oral Daily  . carvedilol  25 mg Oral BID WC  . feeding supplement (NEPRO CARB STEADY)  237 mL Oral BID BM  . gabapentin  300 mg Oral BID  . heparin  5,000 Units Subcutaneous Q12H  . hydrALAZINE  100 mg Oral TID  . isosorbide mononitrate  30 mg Oral Daily  . lamiVUDine  100 mg Oral Daily  . levothyroxine  25 mcg Oral Q0600  . lubiprostone  24 mcg Oral BID WC  . multivitamin  1 tablet Oral QPM  . olopatadine  1 drop Both Eyes Daily  . raltegravir  400 mg Oral BID  . sodium chloride flush  3 mL Intravenous Q12H  . sucroferric oxyhydroxide  500 mg Oral TID   Continuous Infusions: . sodium chloride        LOS: 1 day   Charlynne Cousins  [facility]  08/31/2020, 7:48  AM

## 2020-09-01 DIAGNOSIS — J189 Pneumonia, unspecified organism: Secondary | ICD-10-CM | POA: Diagnosis not present

## 2020-09-01 DIAGNOSIS — D638 Anemia in other chronic diseases classified elsewhere: Secondary | ICD-10-CM | POA: Diagnosis not present

## 2020-09-01 DIAGNOSIS — N186 End stage renal disease: Secondary | ICD-10-CM | POA: Diagnosis not present

## 2020-09-01 DIAGNOSIS — I5031 Acute diastolic (congestive) heart failure: Secondary | ICD-10-CM | POA: Diagnosis not present

## 2020-09-01 LAB — BASIC METABOLIC PANEL
Anion gap: 13 (ref 5–15)
BUN: 23 mg/dL — ABNORMAL HIGH (ref 6–20)
CO2: 25 mmol/L (ref 22–32)
Calcium: 9.5 mg/dL (ref 8.9–10.3)
Chloride: 100 mmol/L (ref 98–111)
Creatinine, Ser: 5.03 mg/dL — ABNORMAL HIGH (ref 0.61–1.24)
GFR, Estimated: 13 mL/min — ABNORMAL LOW (ref >60)
Glucose, Bld: 99 mg/dL (ref 70–99)
Potassium: 3.3 mmol/L — ABNORMAL LOW (ref 3.5–5.1)
Sodium: 138 mmol/L (ref 135–145)

## 2020-09-01 LAB — PROCALCITONIN: Procalcitonin: 0.54 ng/mL

## 2020-09-01 MED ORDER — DOXYCYCLINE HYCLATE 100 MG PO TABS
100.0000 mg | ORAL_TABLET | Freq: Two times a day (BID) | ORAL | Status: DC
  Administered 2020-09-01 – 2020-09-04 (×7): 100 mg via ORAL
  Filled 2020-09-01 (×7): qty 1

## 2020-09-01 MED ORDER — ONDANSETRON HCL 4 MG/2ML IJ SOLN: 4.0000 mg | Freq: Four times a day (QID) | INTRAMUSCULAR | Status: DC | PRN

## 2020-09-01 NOTE — Progress Notes (Signed)
   09/01/20 1400  Assess: MEWS Score  BP (!) 74/61  Assess: MEWS Score  MEWS Temp 0  MEWS Systolic 2  MEWS Pulse 1  MEWS RR 0  MEWS LOC 0  MEWS Score 3  MEWS Score Color Yellow  Assess: if the MEWS score is Yellow or Red  Were vital signs taken at a resting state? Yes  Focused Assessment Change from prior assessment (see assessment flowsheet)  Early Detection of Sepsis Score *See Row Information* High  MEWS guidelines implemented *See Row Information* Yes  Treat  Pain Scale 0-10  Pain Score 0  Take Vital Signs  Increase Vital Sign Frequency  Yellow: Q 2hr X 2 then Q 4hr X 2, if remains yellow, continue Q 4hrs  Escalate  MEWS: Escalate Yellow: discuss with charge nurse/RN and consider discussing with provider and RRT  Notify: Charge Nurse/RN  Name of Charge Nurse/RN Notified Barbie RN  Date Charge Nurse/RN Notified 09/01/20  Time Charge Nurse/RN Notified 1414  Notify: Provider  Provider Name/Title Aileen Fass MD  Date Provider Notified 09/01/20  Time Provider Notified 1400  Notification Type Page  Notification Reason Change in status  Response See new orders  Date of Provider Response 09/01/20  Time of Provider Response 1400  Notify: Rapid Response  Name of Rapid Response RN Notified N/A  Document  Patient Outcome Stabilized after interventions  Progress note created (see row info) Yes

## 2020-09-01 NOTE — Progress Notes (Signed)
Primary team has asked for TEE for patient given TTE findings concerning for possible severe MR. Will look to arrange for Monday   Carlyle Dolly MD

## 2020-09-01 NOTE — Progress Notes (Addendum)
[facility] PROGRESS NOTE    Progress Note  Carel Carrier Chancy  YTK:[id number] DOB: July 31, 1966 DOA: 08/30/2020 PCP: [name], FNP     Brief Narrative:   Davidson Palmieri Windt is an 54 y.o. male past medical history of end-stage renal disease Monday Tuesday Fridays, refractory hypertension, chronic combined diastolic heart failure with a last EF on 02/14/2020 that showed an EF of 75% with diastolic dysfunction grade 2, HIV on [place] therapy comes in for shortness of breath that started about 2 weeks prior to admission with a dry cough and a fever supposedly sent from dialysis center to the ED chest x-ray was done that showed significant fluid overloaded hypertensive 1300 .  Assessment/Plan:   Acute respiratory failure with hypoxia of unclear etiology: More likely pulmonary edema versus pneumonia which seems less likely. BNP is elevated in 1300s ED of the chest CT shows b/l infiltrate procalcitonin is low yield, influenza and SARS-CoV-2 PCR negative.  Twelve-lead EKG shows no signs of acute coronary syndrome. Nephrology has been consulted for HD. 2D echo was done that showed a preserved EF but with possibly severe mitral regurgitation, will get a TEE probably will be done on 09/03/2020 Cont Rocephin and azithromycin, his procalcitonin is trending up.  Culture data has remained negative till date.  I have a low threshold to discontinue antibiotics.  Uncontrolled hypertension/Hypertensive heart disease with CHF (congestive heart failure) ([place]): This appears to be the major problem of hospitalization Coreg and hydralazine have been maxed out. Blood pressure is fairly stable ranging 140/93-130 8/96.  Severe hypokalemia: Improved with HD.  HIV disease ([place]) Continue current medications.  Anemia in other chronic diseases: Per renal  End stage renal disease ([place]) Per renal   DVT prophylaxis: lovenox Family Communication:none Status is: Inpatien    Code Status:     Code Status  Orders  (From admission, onward)         Start     Ordered   08/30/20 1748  Full code  Continuous        08/30/20 1749        Code Status History    Date Active Date Inactive Code Status Order ID Comments User Context   06/13/2018 2253 06/14/2018 1811 Full Code [id number]  Vianne Bulls, MD ED   06/07/2017 1600 06/08/2017 1600 Full Code [id number]  Maryellen Pile, MD Inpatient   10/09/2016 0506 10/11/2016 1404 Full Code [id number]  Etta Quill, DO Inpatient   Advance Care Planning Activity        IV Access:    Peripheral IV   Procedures and diagnostic studies:   DG Chest 2 View  Result Date: 08/30/2020 CLINICAL DATA:  Chest pain.  Shortness of breath.  Cough and fever. EXAM: CHEST - 2 VIEW COMPARISON:  08/15/2020. FINDINGS: Left basilar opacities with apparent "spine sign" on the lateral radiograph. Vascular congestion without overt pulmonary edema. No visible pleural effusions or pneumothorax. Enlarged cardiac silhouette. IMPRESSION: 1. Left basilar opacities, which may represent atelectasis, aspiration, and/or pneumonia. 2. Vascular congestion without overt pulmonary edema. 3. Cardiomegaly. Electronically Signed   By: Margaretha Sheffield MD   On: 08/30/2020 11:43   CT CHEST WO CONTRAST  Result Date: 08/30/2020 CLINICAL DATA:  Pneumonia, unresolved Worsening chest pain, shortness of breath and cough. EXAM: CT CHEST WITHOUT CONTRAST TECHNIQUE: Multidetector CT imaging of the chest was performed following the standard protocol without IV contrast. COMPARISON:  Chest radiograph earlier today FINDINGS: Cardiovascular: Cardiomegaly with coronary artery calcifications. The thoracic aorta  is normal in caliber. No pericardial effusion. Mediastinum/Nodes: Small mediastinal lymph nodes are not enlarged by size criteria. Limited assessment for hilar adenopathy in the absence of IV contrast. Tiny hiatal hernia. No esophageal wall thickening. No suspicious thyroid nodule. Lungs/Pleura: There  are perihilar ground-glass opacities, relatively symmetric. Areas of smooth septal thickening. There is mild central bronchial thickening. Overall findings favor pulmonary edema. Possibility of atypical infection is also considered. No confluent consolidation. Mild apical emphysema. No pulmonary mass. No significant pleural effusion. Upper Abdomen: Cholecystectomy.  No acute findings. Musculoskeletal: There are no acute or suspicious osseous abnormalities. IMPRESSION: 1. Cardiomegaly. Perihilar ground-glass opacities and smooth septal thickening and mild bronchial thickening. Overall findings favor pulmonary edema. Possibility of bronchitis and atypical infection is also considered, but felt less likely. 2. Coronary artery calcifications. Emphysema (ICD10-J43.9). Electronically Signed   By: Keith Rake M.D.   On: 08/30/2020 19:29   ECHOCARDIOGRAM COMPLETE  Result Date: 08/31/2020    ECHOCARDIOGRAM REPORT   Patient Name:   LEALON VANPUTTEN Regas Date of Exam: 08/31/2020 Medical Rec #:  [id number]       Height:       70.0 in Accession #:    [id number]      Weight:       198.2 lb Date of Birth:  08-30-1966        BSA:          2.079 m Patient Age:    83 years        BP:           155/89 mmHg Patient Gender: M               HR:           96 bpm. Exam Location:  Inpatient Procedure: 2D Echo, Cardiac Doppler and Color Doppler Indications:    I50.23 Acute on chronic systolic (congestive) heart failure  History:        Patient has prior history of Echocardiogram examinations, most                 recent 01/31/2020. Risk Factors:Hypertension, Dyslipidemia and                 Sleep Apnea. CKD.  Sonographer:    Jonelle Sidle Dance Referring Phys: [id number] [place]  1. Left ventricular ejection fraction, by estimation, is 60 to 65%. The left ventricle has normal function. The left ventricle has no regional wall motion abnormalities. The left ventricular internal cavity size was moderately dilated. There is mild left  ventricular hypertrophy.  2. Right ventricular systolic function is normal. The right ventricular size is normal. There is moderately elevated pulmonary artery systolic pressure.  3. Left atrial size was severely dilated.  4. Right atrial size was mildly dilated.  5. MR jet is eccentrically directed along posterior wall of LA Would recomm TEE to further define mechanism. COmpared to echo from APril 2021, MR appears more severe.. The mitral valve is grossly normal. Severe mitral valve regurgitation.  6. The aortic valve is normal in structure. Aortic valve regurgitation is trivial.  7. Aortic dilatation noted. There is mild dilatation of the aortic root, measuring 40 mm.  8. The inferior vena cava is normal in size with greater than 50% respiratory variability, suggesting right atrial pressure of 3 mmHg. FINDINGS  Left Ventricle: Left ventricular ejection fraction, by estimation, is 60 to 65%. The left ventricle has normal function. The left ventricle has no regional wall motion abnormalities. The left  ventricular internal cavity size was moderately dilated. There is mild left ventricular hypertrophy. Left ventricular diastolic function could not be evaluated. Right Ventricle: The right ventricular size is normal. Right vetricular wall thickness was not assessed. Right ventricular systolic function is normal. There is moderately elevated pulmonary artery systolic pressure. The tricuspid regurgitant velocity is  3.11 m/s, and with an assumed right atrial pressure of 8 mmHg, the estimated right ventricular systolic pressure is 70.6 mmHg. Left Atrium: Left atrial size was severely dilated. Right Atrium: Right atrial size was mildly dilated. Pericardium: There is no evidence of pericardial effusion. Mitral Valve: MR jet is eccentrically directed along posterior wall of LA Would recomm TEE to further define mechanism. COmpared to echo from APril 2021, MR appears more severe. The mitral valve is grossly normal. Severe  mitral valve regurgitation. Tricuspid Valve: The tricuspid valve is normal in structure. Tricuspid valve regurgitation is mild. Aortic Valve: The aortic valve is normal in structure. Aortic valve regurgitation is trivial. Pulmonic Valve: The pulmonic valve was normal in structure. Pulmonic valve regurgitation is mild. Aorta: Aortic dilatation noted. There is mild dilatation of the aortic root, measuring 40 mm. Venous: The inferior vena cava is normal in size with greater than 50% respiratory variability, suggesting right atrial pressure of 3 mmHg. IAS/Shunts: No atrial level shunt detected by color flow Doppler.  LEFT VENTRICLE PLAX 2D LVIDd:         5.88 cm LVIDs:         3.83 cm LV PW:         1.25 cm LV IVS:        1.28 cm LVOT diam:     2.30 cm LV SV:         94 LV SV Index:   45 LVOT Area:     4.15 cm  RIGHT VENTRICLE             IVC RV Basal diam:  3.82 cm     IVC diam: 3.00 cm RV Mid diam:    2.58 cm RV S prime:     19.60 cm/s TAPSE (M-mode): 2.6 cm LEFT ATRIUM              Index       RIGHT ATRIUM           Index LA diam:        5.60 cm  2.69 cm/m  RA Area:     27.50 cm LA Vol (A2C):   137.0 ml 65.89 ml/m RA Volume:   91.50 ml  44.01 ml/m LA Vol (A4C):   116.0 ml 55.79 ml/m LA Biplane Vol: 127.0 ml 61.08 ml/m  AORTIC VALVE LVOT Vmax:   130.00 cm/s LVOT Vmean:  88.750 cm/s LVOT VTI:    0.226 m  AORTA Ao Root diam: 4.00 cm Ao Asc diam:  3.90 cm MITRAL VALVE                 TRICUSPID VALVE MV Area (PHT): 4.21 cm      TR Peak grad:   38.7 mmHg MV Decel Time: 180 msec      TR Vmax:        311.00 cm/s MR Peak grad:    140.7 mmHg MR Mean grad:    98.5 mmHg   SHUNTS MR Vmax:         593.00 cm/s Systemic VTI:  0.23 m MR Vmean:        467.5 cm/s  Systemic Diam: 2.30 cm  MR PISA:         1.57 cm MR PISA Eff ROA: 11 mm MR PISA Radius:  0.50 cm MV E velocity: 154.00 cm/s MV A velocity: 65.00 cm/s MV E/A ratio:  2.37 [name] MD Electronically signed by [name] MD Signature Date/Time: 08/31/2020/4:09:59 PM     Final      Medical Consultants:    None.  Anti-Infectives:   Rocephin and azithro  Subjective:    Gurinder Toral Winget relates his cough is improved today.  Objective:    Vitals:   09/01/20 0925 09/01/20 0940 09/01/20 1010 09/01/20 1023  BP: (!) 150/97 (!) 141/85 (!) 142/87 (!) 138/96  Pulse:      Resp: 17 (!) 23 (!) 23 (!) 28  Temp:    99 F (37.2 C)  TempSrc:    Oral  SpO2:      Weight:    83.4 kg  Height:       SpO2: 98 %   Intake/Output Summary (Last 24 hours) at 09/01/2020 1048 Last data filed at 09/01/2020 1023 Gross per 24 hour  Intake 480 ml  Output 6300 ml  Net -5820 ml   Filed Weights   09/01/20 0500 09/01/20 0708 09/01/20 1023  Weight: 86.6 kg 86.6 kg 83.4 kg    Exam: General exam: In no acute distress. Respiratory system: Good air movement and clear to auscultation. Cardiovascular system: S1 & S2 heard, RRR. No JVD. Gastrointestinal system: Abdomen is nondistended, soft and nontender.  Extremities: No pedal edema. Skin: No rashes, lesions or ulcers Psychiatry: Judgement and insight appear normal. Mood & affect appropriate.   Data Reviewed:    Labs: Basic Metabolic Panel: Recent Labs  Lab 08/30/20 1113 08/30/20 1113 08/30/20 2028 08/30/20 2238 08/30/20 2238 08/31/20 0120 09/01/20 0422  NA 139  --   --  137  --  138 138  K 2.8*   < >  --  2.9*   < > 2.9* 3.3*  CL 95*  --   --  97*  --  101 100  CO2 31  --   --  27  --  26 25  GLUCOSE 115*  --   --  125*  --  112* 99  BUN 26*  --   --  34*  --  34* 23*  CREATININE 4.76*  --   --  5.45*  --  5.65* 5.03*  CALCIUM 10.9*  --   --  10.4*  --  10.2 9.5  MG  --   --  1.8  --   --   --   --    < > = values in this interval not displayed.   GFR Estimated Creatinine Clearance: 17.3 mL/min (A) (by C-G formula based on SCr of 5.03 mg/dL (H)). Liver Function Tests: Recent Labs  Lab 08/30/20 1402  AST 14*  ALT 15  ALKPHOS 52  BILITOT 0.7  PROT 7.5  ALBUMIN 3.8   No results for  input(s): LIPASE, AMYLASE in the last 168 hours. No results for input(s): AMMONIA in the last 168 hours. Coagulation profile No results for input(s): INR, PROTIME in the last 168 hours. COVID-19 Labs  No results for input(s): DDIMER, FERRITIN, LDH, CRP in the last 72 hours.  Lab Results  Component Value Date   SARSCOV2NAA NEGATIVE 08/30/2020   [place] NEGATIVE 08/15/2020    CBC: Recent Labs  Lab 08/30/20 1113 08/31/20 0631  WBC 10.5 7.3  NEUTROABS  --  4.7  HGB 9.4* 8.5*  HCT 27.4* 24.8*  MCV 99.3 102.1*  PLT 145* 144*   Cardiac Enzymes: No results for input(s): CKTOTAL, CKMB, CKMBINDEX, TROPONINI in the last 168 hours. BNP (last 3 results) No results for input(s): PROBNP in the last 8760 hours. CBG: No results for input(s): GLUCAP in the last 168 hours. D-Dimer: No results for input(s): DDIMER in the last 72 hours. Hgb A1c: No results for input(s): HGBA1C in the last 72 hours. Lipid Profile: No results for input(s): CHOL, HDL, LDLCALC, TRIG, CHOLHDL, LDLDIRECT in the last 72 hours. Thyroid function studies: No results for input(s): TSH, T4TOTAL, T3FREE, THYROIDAB in the last 72 hours.  Invalid input(s): FREET3 Anemia work up: No results for input(s): VITAMINB12, FOLATE, FERRITIN, TIBC, IRON, RETICCTPCT in the last 72 hours. Sepsis Labs: Recent Labs  Lab 08/30/20 1113 08/30/20 2028 08/31/20 0120 08/31/20 0631 09/01/20 0422  PROCALCITON  --  0.31 0.34 0.37 0.54  WBC 10.5  --   --  7.3  --    Microbiology Recent Results (from the past 240 hour(s))  Respiratory Panel by RT PCR (Flu A&B, Covid) - Nasopharyngeal Swab     Status: None   Collection Time: 08/30/20  2:02 PM   Specimen: Nasopharyngeal Swab  Result Value Ref Range Status   SARS Coronavirus 2 by RT PCR NEGATIVE NEGATIVE Final    Comment: (NOTE) SARS-CoV-2 target nucleic acids are NOT DETECTED.  The SARS-CoV-2 RNA is generally detectable in upper respiratoy specimens during the acute phase of  infection. The lowest concentration of SARS-CoV-2 viral copies this assay can detect is 131 copies/mL. A negative result does not preclude SARS-Cov-2 infection and should not be used as the sole basis for treatment or other patient management decisions. A negative result may occur with  improper specimen collection/handling, submission of specimen other than nasopharyngeal swab, presence of viral mutation(s) within the areas targeted by this assay, and inadequate number of viral copies (<131 copies/mL). A negative result must be combined with clinical observations, patient history, and epidemiological information. The expected result is Negative.  Fact Sheet for Patients:  PinkCheek.be  Fact Sheet for Healthcare Providers:  GravelBags.it  This test is no t yet approved or cleared by the [place] FDA and  has been authorized for detection and/or diagnosis of SARS-CoV-2 by FDA under an Emergency Use Authorization (EUA). This EUA will remain  in effect (meaning this test can be used) for the duration of the COVID-19 declaration under Section 564(b)(1) of the Act, 21 U.S.C. section 360bbb-3(b)(1), unless the authorization is terminated or revoked sooner.     Influenza A by PCR NEGATIVE NEGATIVE Final   Influenza B by PCR NEGATIVE NEGATIVE Final    Comment: (NOTE) The Xpert Xpress SARS-CoV-2/FLU/RSV assay is intended as an aid in  the diagnosis of influenza from Nasopharyngeal swab specimens and  should not be used as a sole basis for treatment. Nasal washings and  aspirates are unacceptable for Xpert Xpress SARS-CoV-2/FLU/RSV  testing.  Fact Sheet for Patients: PinkCheek.be  Fact Sheet for Healthcare Providers: GravelBags.it  This test is not yet approved or cleared by the [place] FDA and  has been authorized for detection and/or diagnosis of SARS-CoV-2  by  FDA under an Emergency Use Authorization (EUA). This EUA will remain  in effect (meaning this test can be used) for the duration of the  Covid-19 declaration under Section 564(b)(1) of the Act, 21  U.S.C. section 360bbb-3(b)(1), unless the authorization is  terminated or revoked. Performed  at [facility], [place] [address], [place], [place] [address]   Culture, blood (routine x 2)     Status: None (Preliminary result)   Collection Time: 08/30/20  2:02 PM   Specimen: BLOOD RIGHT HAND  Result Value Ref Range Status   Specimen Description BLOOD RIGHT HAND  Final   Special Requests   Final    BOTTLES DRAWN AEROBIC AND ANAEROBIC Blood Culture adequate volume   Culture   Final    NO GROWTH 2 DAYS Performed at [facility], [place] [address], [place], [place] [address]    Report Status PENDING  Incomplete  Culture, blood (routine x 2)     Status: None (Preliminary result)   Collection Time: 08/30/20  2:02 PM   Specimen: BLOOD RIGHT HAND  Result Value Ref Range Status   Specimen Description BLOOD RIGHT HAND  Final   Special Requests   Final    BOTTLES DRAWN AEROBIC AND ANAEROBIC Blood Culture results may not be optimal due to an inadequate volume of blood received in culture bottles   Culture   Final    NO GROWTH 2 DAYS Performed at [facility], [place] [address], [place], [place] [address]    Report Status PENDING  Incomplete  MRSA PCR Screening     Status: None   Collection Time: 08/31/20  1:28 AM   Specimen: Nasal Mucosa; Nasopharyngeal  Result Value Ref Range Status   MRSA by PCR NEGATIVE NEGATIVE Final    Comment:        The GeneXpert MRSA Assay (FDA approved for NASAL specimens only), is one component of a comprehensive MRSA colonization surveillance program. It is not intended to diagnose MRSA infection nor to guide or monitor treatment for MRSA infections. Performed at [facility], [place] [address] [place], [place] [address]      Medications:    . (feeding supplement) PROSource Plus  30 mL Oral TID with meals  . abacavir  300 mg Oral Daily  . allopurinol  100 mg Oral BID  . amLODipine  10 mg Oral Daily  . vitamin C with rose hips  1,000 mg Oral Daily  . budesonide  0.25 mg Inhalation BID  . calcitRIOL  0.5 mcg Oral Daily  . carvedilol  25 mg Oral BID WC  . Chlorhexidine Gluconate Cloth  6 each Topical Q0600  . feeding supplement (NEPRO CARB STEADY)  237 mL Oral BID BM  . gabapentin  300 mg Oral BID  . heparin  5,000 Units Subcutaneous Q12H  . hydrALAZINE  100 mg Oral TID  . isosorbide mononitrate  30 mg Oral Daily  . lamiVUDine  100 mg Oral Daily  . levothyroxine  25 mcg Oral Q0600  . lubiprostone  24 mcg Oral BID WC  . multivitamin  1 tablet Oral QPM  . olopatadine  1 drop Both Eyes Daily  . raltegravir  400 mg Oral BID  . sodium chloride flush  3 mL Intravenous Q12H  . sucroferric oxyhydroxide  500 mg Oral TID   Continuous Infusions: . sodium chloride    . azithromycin 500 mg (08/31/20 1356)  . cefTRIAXone (ROCEPHIN)  IV 2 g (08/31/20 1300)      LOS: 2 days   Charlynne Cousins  [facility]  09/01/2020, 10:48 AM

## 2020-09-01 NOTE — Progress Notes (Signed)
Request sent to weekday coordinator for TEE on Monday. Schedule looks fairly full so unsure if able to accommodate, but will know for sure on Monday when endo re-opens. Keep NPO after midnight Sunday into Monday (except may take sips with meds). Alvey Brockel PA-C

## 2020-09-01 NOTE — Progress Notes (Addendum)
[facility] Progress Note   Subjective: Seen on HD via AVF. No C/Os. Denies SOB. Attempting Net UF 2.5 L.   Objective Vitals:   09/01/20 0755 09/01/20 0810 09/01/20 0824 09/01/20 0838  BP: (!) 148/104 (!) 150/109 (!) 140/101 (!) 158/101  Pulse:      Resp: (!) 28 (!) 26 (!) 26 20  Temp:      TempSrc:      SpO2:      Weight:      Height:       Physical Exam General: WN, WD in NAD Heart: S1,S2 RRR No M/R/G Lungs: CTAB Abdomen: S, NT Extremities: No LE edema Dialysis Access:  L AVF blood lines connected    Additional Objective Labs: Basic Metabolic Panel: Recent Labs  Lab 08/30/20 2238 08/31/20 0120 09/01/20 0422  NA 137 138 138  K 2.9* 2.9* 3.3*  CL 97* 101 100  CO2 27 26 25   GLUCOSE 125* 112* 99  BUN 34* 34* 23*  CREATININE 5.45* 5.65* 5.03*  CALCIUM 10.4* 10.2 9.5   Liver Function Tests: Recent Labs  Lab 08/30/20 1402  AST 14*  ALT 15  ALKPHOS 52  BILITOT 0.7  PROT 7.5  ALBUMIN 3.8   No results for input(s): LIPASE, AMYLASE in the last 168 hours. CBC: Recent Labs  Lab 08/30/20 1113 08/31/20 0631  WBC 10.5 7.3  NEUTROABS  --  4.7  HGB 9.4* 8.5*  HCT 27.4* 24.8*  MCV 99.3 102.1*  PLT 145* 144*   Blood Culture    Component Value Date/Time   SDES BLOOD RIGHT HAND 08/30/2020 1402   SDES BLOOD RIGHT HAND 08/30/2020 1402   SPECREQUEST  08/30/2020 1402    BOTTLES DRAWN AEROBIC AND ANAEROBIC Blood Culture adequate volume   SPECREQUEST  08/30/2020 1402    BOTTLES DRAWN AEROBIC AND ANAEROBIC Blood Culture results may not be optimal due to an inadequate volume of blood received in culture bottles   CULT  08/30/2020 1402    NO GROWTH 2 DAYS Performed at [facility] [address] [name], [place] [address]    CULT  08/30/2020 1402    NO GROWTH 2 DAYS Performed at [facility], [place] [address], [place], [place] [address]    REPTSTATUS PENDING 08/30/2020 1402   REPTSTATUS PENDING 08/30/2020 1402    Cardiac  Enzymes: No results for input(s): CKTOTAL, CKMB, CKMBINDEX, TROPONINI in the last 168 hours. CBG: No results for input(s): GLUCAP in the last 168 hours. Iron Studies: No results for input(s): IRON, TIBC, TRANSFERRIN, FERRITIN in the last 72 hours. @lablastinr3 @ Studies/Results: DG Chest 2 View  Result Date: 08/30/2020 CLINICAL DATA:  Chest pain.  Shortness of breath.  Cough and fever. EXAM: CHEST - 2 VIEW COMPARISON:  08/15/2020. FINDINGS: Left basilar opacities with apparent "spine sign" on the lateral radiograph. Vascular congestion without overt pulmonary edema. No visible pleural effusions or pneumothorax. Enlarged cardiac silhouette. IMPRESSION: 1. Left basilar opacities, which may represent atelectasis, aspiration, and/or pneumonia. 2. Vascular congestion without overt pulmonary edema. 3. Cardiomegaly. Electronically Signed   By: Margaretha Sheffield MD   On: 08/30/2020 11:43   CT CHEST WO CONTRAST  Result Date: 08/30/2020 CLINICAL DATA:  Pneumonia, unresolved Worsening chest pain, shortness of breath and cough. EXAM: CT CHEST WITHOUT CONTRAST TECHNIQUE: Multidetector CT imaging of the chest was performed following the standard protocol without IV contrast. COMPARISON:  Chest radiograph earlier today FINDINGS: Cardiovascular: Cardiomegaly with coronary artery calcifications. The thoracic aorta is normal  in caliber. No pericardial effusion. Mediastinum/Nodes: Small mediastinal lymph nodes are not enlarged by size criteria. Limited assessment for hilar adenopathy in the absence of IV contrast. Tiny hiatal hernia. No esophageal wall thickening. No suspicious thyroid nodule. Lungs/Pleura: There are perihilar ground-glass opacities, relatively symmetric. Areas of smooth septal thickening. There is mild central bronchial thickening. Overall findings favor pulmonary edema. Possibility of atypical infection is also considered. No confluent consolidation. Mild apical emphysema. No pulmonary mass. No  significant pleural effusion. Upper Abdomen: Cholecystectomy.  No acute findings. Musculoskeletal: There are no acute or suspicious osseous abnormalities. IMPRESSION: 1. Cardiomegaly. Perihilar ground-glass opacities and smooth septal thickening and mild bronchial thickening. Overall findings favor pulmonary edema. Possibility of bronchitis and atypical infection is also considered, but felt less likely. 2. Coronary artery calcifications. Emphysema (ICD10-J43.9). Electronically Signed   By: Keith Rake M.D.   On: 08/30/2020 19:29   ECHOCARDIOGRAM COMPLETE  Result Date: 08/31/2020    ECHOCARDIOGRAM REPORT   Patient Name:   Albert Eaton Date of Exam: 08/31/2020 Medical Rec #:  [id number]       Height:       70.0 in Accession #:    [id number]      Weight:       198.2 lb Date of Birth:  [birth year]        BSA:          2.079 m Patient Age:    54 years        BP:           155/89 mmHg Patient Gender: M               HR:           96 bpm. Exam Location:  Inpatient Procedure: 2D Echo, Cardiac Doppler and Color Doppler Indications:    I50.23 Acute on chronic systolic (congestive) heart failure  History:        Patient has prior history of Echocardiogram examinations, most                 recent 01/31/2020. Risk Factors:Hypertension, Dyslipidemia and                 Sleep Apnea. CKD.  Sonographer:    Jonelle Sidle Dance Referring Phys: [id number] [place]  1. Left ventricular ejection fraction, by estimation, is 60 to 65%. The left ventricle has normal function. The left ventricle has no regional wall motion abnormalities. The left ventricular internal cavity size was moderately dilated. There is mild left ventricular hypertrophy.  2. Right ventricular systolic function is normal. The right ventricular size is normal. There is moderately elevated pulmonary artery systolic pressure.  3. Left atrial size was severely dilated.  4. Right atrial size was mildly dilated.  5. MR jet is eccentrically directed along  posterior wall of LA Would recomm TEE to further define mechanism. COmpared to echo from APril 2021, MR appears more severe.. The mitral valve is grossly normal. Severe mitral valve regurgitation.  6. The aortic valve is normal in structure. Aortic valve regurgitation is trivial.  7. Aortic dilatation noted. There is mild dilatation of the aortic root, measuring 40 mm.  8. The inferior vena cava is normal in size with greater than 50% respiratory variability, suggesting right atrial pressure of 3 mmHg. FINDINGS  Left Ventricle: Left ventricular ejection fraction, by estimation, is 60 to 65%. The left ventricle has normal function. The left ventricle has no regional wall motion abnormalities. The left ventricular internal  cavity size was moderately dilated. There is mild left ventricular hypertrophy. Left ventricular diastolic function could not be evaluated. Right Ventricle: The right ventricular size is normal. Right vetricular wall thickness was not assessed. Right ventricular systolic function is normal. There is moderately elevated pulmonary artery systolic pressure. The tricuspid regurgitant velocity is  3.11 m/s, and with an assumed right atrial pressure of 8 mmHg, the estimated right ventricular systolic pressure is 46.9 mmHg. Left Atrium: Left atrial size was severely dilated. Right Atrium: Right atrial size was mildly dilated. Pericardium: There is no evidence of pericardial effusion. Mitral Valve: MR jet is eccentrically directed along posterior wall of LA Would recomm TEE to further define mechanism. COmpared to echo from APril 2021, MR appears more severe. The mitral valve is grossly normal. Severe mitral valve regurgitation. Tricuspid Valve: The tricuspid valve is normal in structure. Tricuspid valve regurgitation is mild. Aortic Valve: The aortic valve is normal in structure. Aortic valve regurgitation is trivial. Pulmonic Valve: The pulmonic valve was normal in structure. Pulmonic valve regurgitation  is mild. Aorta: Aortic dilatation noted. There is mild dilatation of the aortic root, measuring 40 mm. Venous: The inferior vena cava is normal in size with greater than 50% respiratory variability, suggesting right atrial pressure of 3 mmHg. IAS/Shunts: No atrial level shunt detected by color flow Doppler.  LEFT VENTRICLE PLAX 2D LVIDd:         5.88 cm LVIDs:         3.83 cm LV PW:         1.25 cm LV IVS:        1.28 cm LVOT diam:     2.30 cm LV SV:         94 LV SV Index:   45 LVOT Area:     4.15 cm  RIGHT VENTRICLE             IVC RV Basal diam:  3.82 cm     IVC diam: 3.00 cm RV Mid diam:    2.58 cm RV S prime:     19.60 cm/s TAPSE (M-mode): 2.6 cm LEFT ATRIUM              Index       RIGHT ATRIUM           Index LA diam:        5.60 cm  2.69 cm/m  RA Area:     27.50 cm LA Vol (A2C):   137.0 ml 65.89 ml/m RA Volume:   91.50 ml  44.01 ml/m LA Vol (A4C):   116.0 ml 55.79 ml/m LA Biplane Vol: 127.0 ml 61.08 ml/m  AORTIC VALVE LVOT Vmax:   130.00 cm/s LVOT Vmean:  88.750 cm/s LVOT VTI:    0.226 m  AORTA Ao Root diam: 4.00 cm Ao Asc diam:  3.90 cm MITRAL VALVE                 TRICUSPID VALVE MV Area (PHT): 4.21 cm      TR Peak grad:   38.7 mmHg MV Decel Time: 180 msec      TR Vmax:        311.00 cm/s MR Peak grad:    140.7 mmHg MR Mean grad:    98.5 mmHg   SHUNTS MR Vmax:         593.00 cm/s Systemic VTI:  0.23 m MR Vmean:        467.5 cm/s  Systemic Diam: 2.30 cm MR PISA:  1.57 cm MR PISA Eff ROA: 11 mm MR PISA Radius:  0.50 cm MV E velocity: 154.00 cm/s MV A velocity: 65.00 cm/s MV E/A ratio:  2.37 [name] MD Electronically signed by [name] MD Signature Date/Time: 08/31/2020/4:09:59 PM    Final    Medications: . sodium chloride    . azithromycin 500 mg (08/31/20 1356)  . cefTRIAXone (ROCEPHIN)  IV 2 g (08/31/20 1300)   . (feeding supplement) PROSource Plus  30 mL Oral TID with meals  . abacavir  300 mg Oral Daily  . allopurinol  100 mg Oral BID  . amLODipine  10 mg Oral Daily  .  vitamin C with rose hips  1,000 mg Oral Daily  . budesonide  0.25 mg Inhalation BID  . calcitRIOL  0.5 mcg Oral Daily  . carvedilol  25 mg Oral BID WC  . Chlorhexidine Gluconate Cloth  6 each Topical Q0600  . feeding supplement (NEPRO CARB STEADY)  237 mL Oral BID BM  . gabapentin  300 mg Oral BID  . heparin  5,000 Units Subcutaneous Q12H  . hydrALAZINE  100 mg Oral TID  . isosorbide mononitrate  30 mg Oral Daily  . lamiVUDine  100 mg Oral Daily  . levothyroxine  25 mcg Oral Q0600  . lubiprostone  24 mcg Oral BID WC  . multivitamin  1 tablet Oral QPM  . olopatadine  1 drop Both Eyes Daily  . raltegravir  400 mg Oral BID  . sodium chloride flush  3 mL Intravenous Q12H  . sucroferric oxyhydroxide  500 mg Oral TID      OP HD: Home HD M-Tu-Th-Fri  3-3.5h  91kg  2K/45 lactate   -Hep 5000 unit IV per tx   LUE AVF BFR 450  - mircera 50 mcg IV q2 wk  - venofer 247m IVP per protocol.   Assessment/ Plan: 1. SOB - associated with fevers, chills, and cough- likely due to community acquired pneumonia but may have a component of volume overload (since he has not been challenging his dry weight despite anorexia and weight loss). CT scan revealed cardiomegaly and perihilar ground-glass opacities and smooth septal thickening and mild bronchial thickening favoring pulmonary edema but possible bronchitis and atypical infection. Symptoms have improved with HD. Pt started on ceftriaxone and azithomycin. BC NG X 2 days. Resp panel negative. WBC 7.3.  2. ESRD- On HHD. HD today. HD today. Using 4.0 k bath.   3. Hypertension/volume- below edw but CT scan with pulm edema. Continue lowering volume as tolerated. Lower EDW on DC.  4. Anemia- HGB pending. cont with ESA and follow 5. Metabolic bone disease- Continue with outpatient meds 6. Nutrition- renal diet, low sodium 7. HIV- continue with HAART therapy  Rita H. Brown NP-C 09/01/2020, 8:51 AM  [facility] [phone] Pt seen, examined and agree w A/P as above.  Down 89 > 83kg, well under prior dry wt. Cough/ SOB/ resp status much better. Getting abx for CAP as well.  RKelly Splinter MD 09/01/2020, 12:02 PM

## 2020-09-02 ENCOUNTER — Other Ambulatory Visit: Payer: Self-pay | Admitting: Allergy & Immunology

## 2020-09-02 DIAGNOSIS — N186 End stage renal disease: Secondary | ICD-10-CM | POA: Diagnosis not present

## 2020-09-02 DIAGNOSIS — J189 Pneumonia, unspecified organism: Secondary | ICD-10-CM | POA: Diagnosis not present

## 2020-09-02 DIAGNOSIS — D638 Anemia in other chronic diseases classified elsewhere: Secondary | ICD-10-CM | POA: Diagnosis not present

## 2020-09-02 DIAGNOSIS — I5031 Acute diastolic (congestive) heart failure: Secondary | ICD-10-CM | POA: Diagnosis not present

## 2020-09-02 LAB — BASIC METABOLIC PANEL
Anion gap: 15 (ref 5–15)
BUN: 24 mg/dL — ABNORMAL HIGH (ref 6–20)
CO2: 24 mmol/L (ref 22–32)
Calcium: 9.5 mg/dL (ref 8.9–10.3)
Chloride: 95 mmol/L — ABNORMAL LOW (ref 98–111)
Creatinine, Ser: 5.84 mg/dL — ABNORMAL HIGH (ref 0.61–1.24)
GFR, Estimated: 11 mL/min — ABNORMAL LOW (ref >60)
Glucose, Bld: 94 mg/dL (ref 70–99)
Potassium: 3.4 mmol/L — ABNORMAL LOW (ref 3.5–5.1)
Sodium: 134 mmol/L — ABNORMAL LOW (ref 135–145)

## 2020-09-02 LAB — CBC WITH DIFFERENTIAL/PLATELET
Abs Immature Granulocytes: 0.02 10*3/uL (ref 0.00–0.07)
Basophils Absolute: 0 10*3/uL (ref 0.0–0.1)
Basophils Relative: 0 %
Eosinophils Absolute: 0.2 10*3/uL (ref 0.0–0.5)
Eosinophils Relative: 2 %
HCT: 28.9 % — ABNORMAL LOW (ref 39.0–52.0)
Hemoglobin: 10.4 g/dL — ABNORMAL LOW (ref 13.0–17.0)
Immature Granulocytes: 0 %
Lymphocytes Relative: 29 %
Lymphs Abs: 2.1 10*3/uL (ref 0.7–4.0)
MCH: 35.4 pg — ABNORMAL HIGH (ref 26.0–34.0)
MCHC: 36 g/dL (ref 30.0–36.0)
MCV: 98.3 fL (ref 80.0–100.0)
Monocytes Absolute: 0.9 10*3/uL (ref 0.1–1.0)
Monocytes Relative: 12 %
Neutro Abs: 4.1 10*3/uL (ref 1.7–7.7)
Neutrophils Relative %: 57 %
Platelets: 209 10*3/uL (ref 150–400)
RBC: 2.94 MIL/uL — ABNORMAL LOW (ref 4.22–5.81)
RDW: 11.9 % (ref 11.5–15.5)
WBC: 7.3 10*3/uL (ref 4.0–10.5)
nRBC: 0 % (ref 0.0–0.2)

## 2020-09-02 LAB — PROCALCITONIN: Procalcitonin: 0.99 ng/mL

## 2020-09-02 NOTE — Progress Notes (Signed)
[facility] Progress Note   Subjective: Seen in room, cough and SOB resolved. 6.4 L removed in HD x 2 here. For TEE tomrorow.   Objective Vitals:   09/02/20 0533 09/02/20 0913 09/02/20 0937 09/02/20 1207  BP: (!) 129/102 121/87  106/82  Pulse: 91 89 91 89  Resp: 18  16 20   Temp: 100.2 F (37.9 C) 99.2 F (37.3 C)  98.3 F (36.8 C)  TempSrc: Oral Oral  Oral  SpO2: 98% 97% 96% 98%  Weight: 83.4 kg     Height:       Physical Exam General: WN, WD in NAD Heart: S1,S2 RRR No M/R/G Lungs: CTAB Abdomen: S, NT Extremities: No LE edema Dialysis Access:  L AVF blood lines connected    Additional Objective Labs: Basic Metabolic Panel: Recent Labs  Lab 08/31/20 0120 09/01/20 0422 09/02/20 0246  NA 138 138 134*  K 2.9* 3.3* 3.4*  CL 101 100 95*  CO2 26 25 24   GLUCOSE 112* 99 94  BUN 34* 23* 24*  CREATININE 5.65* 5.03* 5.84*  CALCIUM 10.2 9.5 9.5   Liver Function Tests: Recent Labs  Lab 08/30/20 1402  AST 14*  ALT 15  ALKPHOS 52  BILITOT 0.7  PROT 7.5  ALBUMIN 3.8   No results for input(s): LIPASE, AMYLASE in the last 168 hours. CBC: Recent Labs  Lab 08/30/20 1113 08/31/20 0631  WBC 10.5 7.3  NEUTROABS  --  4.7  HGB 9.4* 8.5*  HCT 27.4* 24.8*  MCV 99.3 102.1*  PLT 145* 144*   Blo  Medications:  sodium chloride     cefTRIAXone (ROCEPHIN)  IV 2 g (09/02/20 1303)    (feeding supplement) PROSource Plus  30 mL Oral TID with meals   abacavir  300 mg Oral Daily   allopurinol  100 mg Oral BID   vitamin C with rose hips  1,000 mg Oral Daily   budesonide  0.25 mg Inhalation BID   calcitRIOL  0.5 mcg Oral Daily   carvedilol  25 mg Oral BID WC   Chlorhexidine Gluconate Cloth  6 each Topical Q0600   doxycycline  100 mg Oral Q12H   feeding supplement (NEPRO CARB STEADY)  237 mL Oral BID BM   gabapentin  300 mg Oral BID   heparin  5,000 Units Subcutaneous Q12H   isosorbide mononitrate  30 mg Oral Daily   lamiVUDine  100 mg  Oral Daily   levothyroxine  25 mcg Oral Q0600   lubiprostone  24 mcg Oral BID WC   multivitamin  1 tablet Oral QPM   olopatadine  1 drop Both Eyes Daily   raltegravir  400 mg Oral BID   sodium chloride flush  3 mL Intravenous Q12H   sucroferric oxyhydroxide  500 mg Oral TID      OP HD: Home HD M-Tu-Th-Fri  3-3.5h  91kg  2K/45 lactate   -Hep 5000 unit IV per tx   LUE AVF BFR 450  - mircera 50 mcg IV q2 wk  - venofer 287m IVP per protocol.   Assessment/ Plan: 1. SOB - associated with fevers, chills, and cough. Pt had pulm edema and likely also CAP. Much better, 6 kg down and 7 kg under his prior dry wt now. Getting abx for CAP. Going for TEE tomorrow.  BC NG X 2 days.  2.  Severe MR - by echo, for TEE tomorrow 3. ESRD- On HHD. HD tomorrow, schedule around TEE.   4. Hypertension/volume- as above,  lowering edw significantly upon dc, euvolemic now.  5. Anemia- HGB pending. cont with ESA and follow 6. Metabolic bone disease- Continue with outpatient meds 7. Nutrition- renal diet, low sodium 8. HIV- continue with HAART therapy  Kelly Splinter, MD 09/02/2020, 1:31 PM

## 2020-09-02 NOTE — Progress Notes (Signed)
[facility] PROGRESS NOTE    Progress Note  Albert Eaton  HKV:[id number] DOB: [birth year] DOA: 08/30/2020 PCP: [name], FNP     Brief Narrative:   Albert Eaton is an 54 y.o. male past medical history of end-stage renal disease Monday Tuesday Fridays, refractory hypertension, chronic combined diastolic heart failure with a last EF on 02/14/2020 that showed an EF of 56% with diastolic dysfunction grade 2, HIV on [place] therapy comes in for shortness of breath that started about 2 weeks prior to admission with a dry cough and a fever supposedly sent from dialysis center to the ED chest x-ray was done that showed significant fluid overloaded hypertensive 1300 .  Assessment/Plan:   Acute respiratory failure with hypoxia of unclear etiology: More likely pulmonary edema versus pneumonia which seems less likely. Nephrology has been consulted for HD. 2D echo was done that showed a preserved EF but possibly severe mitral regurgitation, TEE has been ordered will be done on 09/03/2020. Had a temperature of 100.2 overnight, pro-Cal is trending up, his respiration has remained stable, he denies any cough culture data has remained negative till date, continue IV Rocephin and doxycycline.  Uncontrolled hypertension/Hypertensive heart disease with CHF (congestive heart failure) ([place]): Improved control with Coreg and hydralazine, continue HD per renal. Blood pressure is fairly stable ranging 140/93-130 8/96.  Severe hypokalemia: Improved with HD.  HIV disease ([place]) Continue current medications.  Anemia in other chronic diseases: Per renal  End stage renal disease ([place]) Per renal   DVT prophylaxis: lovenox Family Communication:none Status is: Inpatient    Code Status:     Code Status Orders  (From admission, onward)         Start     Ordered   08/30/20 1748  Full code  Continuous        08/30/20 1749        Code Status History    Date Active Date Inactive Code  Status Order ID Comments User Context   06/13/2018 2253 06/14/2018 1811 Full Code [id number]  Vianne Bulls, MD ED   06/07/2017 1600 06/08/2017 1600 Full Code [id number]  Maryellen Pile, MD Inpatient   10/09/2016 0506 10/11/2016 1404 Full Code [id number]  Etta Quill, DO Inpatient   Advance Care Planning Activity        IV Access:    Peripheral IV   Procedures and diagnostic studies:   ECHOCARDIOGRAM COMPLETE  Result Date: 08/31/2020    ECHOCARDIOGRAM REPORT   Patient Name:   Albert Eaton Date of Exam: 08/31/2020 Medical Rec #:  [id number]       Height:       70.0 in Accession #:    [id number]      Weight:       198.2 lb Date of Birth:  [birth year]        BSA:          2.079 m Patient Age:    5 years        BP:           155/89 mmHg Patient Gender: M               HR:           96 bpm. Exam Location:  Inpatient Procedure: 2D Echo, Cardiac Doppler and Color Doppler Indications:    I50.23 Acute on chronic systolic (congestive) heart failure  History:        Patient has prior history of Echocardiogram examinations, most  recent 01/31/2020. Risk Factors:Hypertension, Dyslipidemia and                 Sleep Apnea. CKD.  Sonographer:    Jonelle Sidle Dance Referring Phys: [id number] [place]  1. Left ventricular ejection fraction, by estimation, is 60 to 65%. The left ventricle has normal function. The left ventricle has no regional wall motion abnormalities. The left ventricular internal cavity size was moderately dilated. There is mild left ventricular hypertrophy.  2. Right ventricular systolic function is normal. The right ventricular size is normal. There is moderately elevated pulmonary artery systolic pressure.  3. Left atrial size was severely dilated.  4. Right atrial size was mildly dilated.  5. MR jet is eccentrically directed along posterior wall of LA Would recomm TEE to further define mechanism. COmpared to echo from APril 2021, MR appears more severe.. The mitral  valve is grossly normal. Severe mitral valve regurgitation.  6. The aortic valve is normal in structure. Aortic valve regurgitation is trivial.  7. Aortic dilatation noted. There is mild dilatation of the aortic root, measuring 40 mm.  8. The inferior vena cava is normal in size with greater than 50% respiratory variability, suggesting right atrial pressure of 3 mmHg. FINDINGS  Left Ventricle: Left ventricular ejection fraction, by estimation, is 60 to 65%. The left ventricle has normal function. The left ventricle has no regional wall motion abnormalities. The left ventricular internal cavity size was moderately dilated. There is mild left ventricular hypertrophy. Left ventricular diastolic function could not be evaluated. Right Ventricle: The right ventricular size is normal. Right vetricular wall thickness was not assessed. Right ventricular systolic function is normal. There is moderately elevated pulmonary artery systolic pressure. The tricuspid regurgitant velocity is  3.11 m/s, and with an assumed right atrial pressure of 8 mmHg, the estimated right ventricular systolic pressure is 67.2 mmHg. Left Atrium: Left atrial size was severely dilated. Right Atrium: Right atrial size was mildly dilated. Pericardium: There is no evidence of pericardial effusion. Mitral Valve: MR jet is eccentrically directed along posterior wall of LA Would recomm TEE to further define mechanism. COmpared to echo from APril 2021, MR appears more severe. The mitral valve is grossly normal. Severe mitral valve regurgitation. Tricuspid Valve: The tricuspid valve is normal in structure. Tricuspid valve regurgitation is mild. Aortic Valve: The aortic valve is normal in structure. Aortic valve regurgitation is trivial. Pulmonic Valve: The pulmonic valve was normal in structure. Pulmonic valve regurgitation is mild. Aorta: Aortic dilatation noted. There is mild dilatation of the aortic root, measuring 40 mm. Venous: The inferior vena cava is  normal in size with greater than 50% respiratory variability, suggesting right atrial pressure of 3 mmHg. IAS/Shunts: No atrial level shunt detected by color flow Doppler.  LEFT VENTRICLE PLAX 2D LVIDd:         5.88 cm LVIDs:         3.83 cm LV PW:         1.25 cm LV IVS:        1.28 cm LVOT diam:     2.30 cm LV SV:         94 LV SV Index:   45 LVOT Area:     4.15 cm  RIGHT VENTRICLE             IVC RV Basal diam:  3.82 cm     IVC diam: 3.00 cm RV Mid diam:    2.58 cm RV S prime:  19.60 cm/s TAPSE (M-mode): 2.6 cm LEFT ATRIUM              Index       RIGHT ATRIUM           Index LA diam:        5.60 cm  2.69 cm/m  RA Area:     27.50 cm LA Vol (A2C):   137.0 ml 65.89 ml/m RA Volume:   91.50 ml  44.01 ml/m LA Vol (A4C):   116.0 ml 55.79 ml/m LA Biplane Vol: 127.0 ml 61.08 ml/m  AORTIC VALVE LVOT Vmax:   130.00 cm/s LVOT Vmean:  88.750 cm/s LVOT VTI:    0.226 m  AORTA Ao Root diam: 4.00 cm Ao Asc diam:  3.90 cm MITRAL VALVE                 TRICUSPID VALVE MV Area (PHT): 4.21 cm      TR Peak grad:   38.7 mmHg MV Decel Time: 180 msec      TR Vmax:        311.00 cm/s MR Peak grad:    140.7 mmHg MR Mean grad:    98.5 mmHg   SHUNTS MR Vmax:         593.00 cm/s Systemic VTI:  0.23 m MR Vmean:        467.5 cm/s  Systemic Diam: 2.30 cm MR PISA:         1.57 cm MR PISA Eff ROA: 11 mm MR PISA Radius:  0.50 cm MV E velocity: 154.00 cm/s MV A velocity: 65.00 cm/s MV E/A ratio:  2.37 [name] MD Electronically signed by [name] MD Signature Date/Time: 08/31/2020/4:09:59 PM    Final      Medical Consultants:    None.  Anti-Infectives:   Rocephin and azithro  Subjective:    Albert Eaton  no further cough he did felt hot overnight.  Objective:    Vitals:   09/01/20 1524 09/01/20 1946 09/01/20 1950 09/02/20 0533  BP: 103/70 101/64  (!) 129/102  Pulse: 85 83 86 91  Resp:  20 18 18   Temp:  99 F (37.2 C)  100.2 F (37.9 C)  TempSrc:  Oral  Oral  SpO2:  98%  98%  Weight:    83.4 kg   Height:       SpO2: 98 %   Intake/Output Summary (Last 24 hours) at 09/02/2020 0820 Last data filed at 09/02/2020 0600 Gross per 24 hour  Intake 460 ml  Output 3000 ml  Net -2540 ml   Filed Weights   09/01/20 1023 09/01/20 1040 09/02/20 0533  Weight: 83.4 kg 83.4 kg 83.4 kg    Exam: General exam: In no acute distress. Respiratory system: Good air movement and clear to auscultation. Cardiovascular system: S1 & S2 heard, RRR. No JVD. Gastrointestinal system: Abdomen is nondistended, soft and nontender.  Extremities: No pedal edema. Skin: No rashes, lesions or ulcers  Data Reviewed:    Labs: Basic Metabolic Panel: Recent Labs  Lab 08/30/20 1113 08/30/20 1113 08/30/20 2028 08/30/20 2238 08/30/20 2238 08/31/20 0120 08/31/20 0120 09/01/20 0422 09/02/20 0246  NA 139  --   --  137  --  138  --  138 134*  K 2.8*   < >  --  2.9*   < > 2.9*   < > 3.3* 3.4*  CL 95*  --   --  97*  --  101  --  100 95*  CO2 31  --   --  27  --  26  --  25 24  GLUCOSE 115*  --   --  125*  --  112*  --  99 94  BUN 26*  --   --  34*  --  34*  --  23* 24*  CREATININE 4.76*  --   --  5.45*  --  5.65*  --  5.03* 5.84*  CALCIUM 10.9*  --   --  10.4*  --  10.2  --  9.5 9.5  MG  --   --  1.8  --   --   --   --   --   --    < > = values in this interval not displayed.   GFR Estimated Creatinine Clearance: 14.9 mL/min (A) (by C-G formula based on SCr of 5.84 mg/dL (H)). Liver Function Tests: Recent Labs  Lab 08/30/20 1402  AST 14*  ALT 15  ALKPHOS 52  BILITOT 0.7  PROT 7.5  ALBUMIN 3.8   No results for input(s): LIPASE, AMYLASE in the last 168 hours. No results for input(s): AMMONIA in the last 168 hours. Coagulation profile No results for input(s): INR, PROTIME in the last 168 hours. COVID-19 Labs  No results for input(s): DDIMER, FERRITIN, LDH, CRP in the last 72 hours.  Lab Results  Component Value Date   SARSCOV2NAA NEGATIVE 08/30/2020   [place] NEGATIVE 08/15/2020     CBC: Recent Labs  Lab 08/30/20 1113 08/31/20 0631  WBC 10.5 7.3  NEUTROABS  --  4.7  HGB 9.4* 8.5*  HCT 27.4* 24.8*  MCV 99.3 102.1*  PLT 145* 144*   Cardiac Enzymes: No results for input(s): CKTOTAL, CKMB, CKMBINDEX, TROPONINI in the last 168 hours. BNP (last 3 results) No results for input(s): PROBNP in the last 8760 hours. CBG: No results for input(s): GLUCAP in the last 168 hours. D-Dimer: No results for input(s): DDIMER in the last 72 hours. Hgb A1c: No results for input(s): HGBA1C in the last 72 hours. Lipid Profile: No results for input(s): CHOL, HDL, LDLCALC, TRIG, CHOLHDL, LDLDIRECT in the last 72 hours. Thyroid function studies: No results for input(s): TSH, T4TOTAL, T3FREE, THYROIDAB in the last 72 hours.  Invalid input(s): FREET3 Anemia work up: No results for input(s): VITAMINB12, FOLATE, FERRITIN, TIBC, IRON, RETICCTPCT in the last 72 hours. Sepsis Labs: Recent Labs  Lab 08/30/20 1113 08/30/20 2028 08/31/20 0120 08/31/20 0631 09/01/20 0422 09/02/20 0246  PROCALCITON  --    < > 0.34 0.37 0.54 0.99  WBC 10.5  --   --  7.3  --   --    < > = values in this interval not displayed.   Microbiology Recent Results (from the past 240 hour(s))  Respiratory Panel by RT PCR (Flu A&B, Covid) - Nasopharyngeal Swab     Status: None   Collection Time: 08/30/20  2:02 PM   Specimen: Nasopharyngeal Swab  Result Value Ref Range Status   SARS Coronavirus 2 by RT PCR NEGATIVE NEGATIVE Final    Comment: (NOTE) SARS-CoV-2 target nucleic acids are NOT DETECTED.  The SARS-CoV-2 RNA is generally detectable in upper respiratoy specimens during the acute phase of infection. The lowest concentration of SARS-CoV-2 viral copies this assay can detect is 131 copies/mL. A negative result does not preclude SARS-Cov-2 infection and should not be used as the sole basis for treatment or other patient management decisions. A negative result may occur with  improper  specimen  collection/handling, submission of specimen other than nasopharyngeal swab, presence of viral mutation(s) within the areas targeted by this assay, and inadequate number of viral copies (<131 copies/mL). A negative result must be combined with clinical observations, patient history, and epidemiological information. The expected result is Negative.  Fact Sheet for Patients:  PinkCheek.be  Fact Sheet for Healthcare Providers:  GravelBags.it  This test is no t yet approved or cleared by the [place] FDA and  has been authorized for detection and/or diagnosis of SARS-CoV-2 by FDA under an Emergency Use Authorization (EUA). This EUA will remain  in effect (meaning this test can be used) for the duration of the COVID-19 declaration under Section 564(b)(1) of the Act, 21 U.S.C. section 360bbb-3(b)(1), unless the authorization is terminated or revoked sooner.     Influenza A by PCR NEGATIVE NEGATIVE Final   Influenza B by PCR NEGATIVE NEGATIVE Final    Comment: (NOTE) The Xpert Xpress SARS-CoV-2/FLU/RSV assay is intended as an aid in  the diagnosis of influenza from Nasopharyngeal swab specimens and  should not be used as a sole basis for treatment. Nasal washings and  aspirates are unacceptable for Xpert Xpress SARS-CoV-2/FLU/RSV  testing.  Fact Sheet for Patients: PinkCheek.be  Fact Sheet for Healthcare Providers: GravelBags.it  This test is not yet approved or cleared by the [place] FDA and  has been authorized for detection and/or diagnosis of SARS-CoV-2 by  FDA under an Emergency Use Authorization (EUA). This EUA will remain  in effect (meaning this test can be used) for the duration of the  Covid-19 declaration under Section 564(b)(1) of the Act, 21  U.S.C. section 360bbb-3(b)(1), unless the authorization is  terminated or revoked. Performed at [facility], [place] [address], [place], [place] [address]   Culture, blood (routine x 2)     Status: None (Preliminary result)   Collection Time: 08/30/20  2:02 PM   Specimen: BLOOD RIGHT HAND  Result Value Ref Range Status   Specimen Description BLOOD RIGHT HAND  Final   Special Requests   Final    BOTTLES DRAWN AEROBIC AND ANAEROBIC Blood Culture adequate volume   Culture   Final    NO GROWTH 2 DAYS Performed at [facility], [place] [address] [place], [place] [address]    Report Status PENDING  Incomplete  Culture, blood (routine x 2)     Status: None (Preliminary result)   Collection Time: 08/30/20  2:02 PM   Specimen: BLOOD RIGHT HAND  Result Value Ref Range Status   Specimen Description BLOOD RIGHT HAND  Final   Special Requests   Final    BOTTLES DRAWN AEROBIC AND ANAEROBIC Blood Culture results may not be optimal due to an inadequate volume of blood received in culture bottles   Culture   Final    NO GROWTH 2 DAYS Performed at [facility], [place] [address], [place], [place] [address]    Report Status PENDING  Incomplete  MRSA PCR Screening     Status: None   Collection Time: 08/31/20  1:28 AM   Specimen: Nasal Mucosa; Nasopharyngeal  Result Value Ref Range Status   MRSA by PCR NEGATIVE NEGATIVE Final    Comment:        The GeneXpert MRSA Assay (FDA approved for NASAL specimens only), is one component of a comprehensive MRSA colonization surveillance program. It is not intended to diagnose MRSA infection nor to guide or monitor treatment for MRSA infections. Performed  at [facility], [place] [address], [place], [place] [address]      Medications:   . (feeding supplement) PROSource Plus  30 mL Oral TID with meals  . abacavir  300 mg Oral Daily  . allopurinol  100 mg Oral BID  . vitamin C with rose hips  1,000 mg Oral Daily  . budesonide  0.25 mg Inhalation BID  . calcitRIOL  0.5 mcg Oral Daily  . carvedilol  25 mg Oral BID WC  . Chlorhexidine  Gluconate Cloth  6 each Topical Q0600  . doxycycline  100 mg Oral Q12H  . feeding supplement (NEPRO CARB STEADY)  237 mL Oral BID BM  . gabapentin  300 mg Oral BID  . heparin  5,000 Units Subcutaneous Q12H  . isosorbide mononitrate  30 mg Oral Daily  . lamiVUDine  100 mg Oral Daily  . levothyroxine  25 mcg Oral Q0600  . lubiprostone  24 mcg Oral BID WC  . multivitamin  1 tablet Oral QPM  . olopatadine  1 drop Both Eyes Daily  . raltegravir  400 mg Oral BID  . sodium chloride flush  3 mL Intravenous Q12H  . sucroferric oxyhydroxide  500 mg Oral TID   Continuous Infusions: . sodium chloride    . cefTRIAXone (ROCEPHIN)  IV 2 g (09/01/20 1235)      LOS: 3 days   Charlynne Cousins  [facility]  09/02/2020, 8:20 AM

## 2020-09-02 NOTE — Progress Notes (Signed)
At time of assessment this am, patient was A&Ox4. Resps even and unlabored, lungs clear to auscultation throughout, slightly diminished. Abdomen is soft, nontender with active BS x 4 quads. He makes very little urine due to ESRD and last BM was yesterday. AV fistula on left arm positive for bruit/thrill; received HD on [place], ThF Patient was complaining of painful IV to right distal forearm and stated it was puffy yesterday; no redness, swelling or erythema noted to site today, but since still painful, will remove. A new PIV obtained in right forearm and patient stated it feels much better. Continues to receive IV abt with no adverse reaction noted. Patient refusing both nepro and prosource; will notify MD. Patient has no edema to BLE and is able to make needs known. He denies pain or discomfort today other than a chronic stomach condition that he states is being addressed on an outpatient basis. Otherwise patient has no complaints, is very cooperative and pleasant. Call light in reach, will monitor.

## 2020-09-03 ENCOUNTER — Encounter (HOSPITAL_COMMUNITY): Payer: Self-pay | Admitting: Internal Medicine

## 2020-09-03 ENCOUNTER — Telehealth: Payer: Self-pay | Admitting: *Deleted

## 2020-09-03 ENCOUNTER — Inpatient Hospital Stay (HOSPITAL_COMMUNITY): Payer: Medicare Other | Admitting: Certified Registered Nurse Anesthetist

## 2020-09-03 ENCOUNTER — Inpatient Hospital Stay (HOSPITAL_COMMUNITY): Payer: Medicare Other

## 2020-09-03 ENCOUNTER — Encounter (HOSPITAL_COMMUNITY)
Admission: EM | Disposition: A | Discharge status: Home / Self Care | Payer: Self-pay | Source: Home / Self Care | Attending: Internal Medicine

## 2020-09-03 DIAGNOSIS — I340 Nonrheumatic mitral (valve) insufficiency: Secondary | ICD-10-CM

## 2020-09-03 DIAGNOSIS — B20 Human immunodeficiency virus [HIV] disease: Secondary | ICD-10-CM

## 2020-09-03 DIAGNOSIS — I5031 Acute diastolic (congestive) heart failure: Secondary | ICD-10-CM | POA: Diagnosis not present

## 2020-09-03 DIAGNOSIS — N186 End stage renal disease: Secondary | ICD-10-CM | POA: Diagnosis not present

## 2020-09-03 DIAGNOSIS — D638 Anemia in other chronic diseases classified elsewhere: Secondary | ICD-10-CM | POA: Diagnosis not present

## 2020-09-03 DIAGNOSIS — J189 Pneumonia, unspecified organism: Secondary | ICD-10-CM | POA: Diagnosis not present

## 2020-09-03 HISTORY — PX: TEE WITHOUT CARDIOVERSION: SHX5443

## 2020-09-03 HISTORY — PX: BUBBLE STUDY: SHX6837

## 2020-09-03 LAB — RENAL FUNCTION PANEL
Albumin: 3.4 g/dL — ABNORMAL LOW (ref 3.5–5.0)
Anion gap: 16 — ABNORMAL HIGH (ref 5–15)
BUN: 57 mg/dL — ABNORMAL HIGH (ref 6–20)
CO2: 23 mmol/L (ref 22–32)
Calcium: 9.6 mg/dL (ref 8.9–10.3)
Chloride: 96 mmol/L — ABNORMAL LOW (ref 98–111)
Creatinine, Ser: 9.23 mg/dL — ABNORMAL HIGH (ref 0.61–1.24)
GFR, Estimated: 6 mL/min — ABNORMAL LOW (ref >60)
Glucose, Bld: 125 mg/dL — ABNORMAL HIGH (ref 70–99)
Phosphorus: 5 mg/dL — ABNORMAL HIGH (ref 2.5–4.6)
Potassium: 2.9 mmol/L — ABNORMAL LOW (ref 3.5–5.1)
Sodium: 135 mmol/L (ref 135–145)

## 2020-09-03 LAB — CBC WITH DIFFERENTIAL/PLATELET
Abs Immature Granulocytes: 0.02 10*3/uL (ref 0.00–0.07)
Basophils Absolute: 0.1 10*3/uL (ref 0.0–0.1)
Basophils Relative: 1 %
Eosinophils Absolute: 0.2 10*3/uL (ref 0.0–0.5)
Eosinophils Relative: 3 %
HCT: 27.2 % — ABNORMAL LOW (ref 39.0–52.0)
Hemoglobin: 9.6 g/dL — ABNORMAL LOW (ref 13.0–17.0)
Immature Granulocytes: 0 %
Lymphocytes Relative: 37 %
Lymphs Abs: 2.7 10*3/uL (ref 0.7–4.0)
MCH: 34.5 pg — ABNORMAL HIGH (ref 26.0–34.0)
MCHC: 35.3 g/dL (ref 30.0–36.0)
MCV: 97.8 fL (ref 80.0–100.0)
Monocytes Absolute: 0.9 10*3/uL (ref 0.1–1.0)
Monocytes Relative: 12 %
Neutro Abs: 3.3 10*3/uL (ref 1.7–7.7)
Neutrophils Relative %: 47 %
Platelets: 215 10*3/uL (ref 150–400)
RBC: 2.78 MIL/uL — ABNORMAL LOW (ref 4.22–5.81)
RDW: 11.9 % (ref 11.5–15.5)
WBC: 7.1 10*3/uL (ref 4.0–10.5)
nRBC: 0 % (ref 0.0–0.2)

## 2020-09-03 LAB — CBC
HCT: 26.8 % — ABNORMAL LOW (ref 39.0–52.0)
Hemoglobin: 9.6 g/dL — ABNORMAL LOW (ref 13.0–17.0)
MCH: 34.5 pg — ABNORMAL HIGH (ref 26.0–34.0)
MCHC: 35.8 g/dL (ref 30.0–36.0)
MCV: 96.4 fL (ref 80.0–100.0)
Platelets: 228 10*3/uL (ref 150–400)
RBC: 2.78 MIL/uL — ABNORMAL LOW (ref 4.22–5.81)
RDW: 11.9 % (ref 11.5–15.5)
WBC: 6.1 10*3/uL (ref 4.0–10.5)
nRBC: 0 % (ref 0.0–0.2)

## 2020-09-03 LAB — ECHO TEE
MV M vel: 5.32 m/s
MV Peak grad: 113.2 mmHg

## 2020-09-03 LAB — BASIC METABOLIC PANEL
Anion gap: 16 — ABNORMAL HIGH (ref 5–15)
BUN: 50 mg/dL — ABNORMAL HIGH (ref 6–20)
CO2: 24 mmol/L (ref 22–32)
Calcium: 10.1 mg/dL (ref 8.9–10.3)
Chloride: 96 mmol/L — ABNORMAL LOW (ref 98–111)
Creatinine, Ser: 8.71 mg/dL — ABNORMAL HIGH (ref 0.61–1.24)
GFR, Estimated: 7 mL/min — ABNORMAL LOW (ref >60)
Glucose, Bld: 97 mg/dL (ref 70–99)
Potassium: 3.2 mmol/L — ABNORMAL LOW (ref 3.5–5.1)
Sodium: 136 mmol/L (ref 135–145)

## 2020-09-03 SURGERY — ECHOCARDIOGRAM, TRANSESOPHAGEAL
Anesthesia: Monitor Anesthesia Care

## 2020-09-03 MED ORDER — PENTAFLUOROPROP-TETRAFLUOROETH EX AERO: 1.0000 "application " | INHALATION_SPRAY | CUTANEOUS | Status: DC | PRN

## 2020-09-03 MED ORDER — BICTEGRAVIR-EMTRICITAB-TENOFOV 50-200-25 MG PO TABS
1.0000 | ORAL_TABLET | Freq: Every day | ORAL | Status: DC
  Administered 2020-09-03 – 2020-09-04 (×2): 1 via ORAL
  Filled 2020-09-03 (×2): qty 1

## 2020-09-03 MED ORDER — BUTAMBEN-TETRACAINE-BENZOCAINE 2-2-14 % EX AERO
INHALATION_SPRAY | CUTANEOUS | Status: DC | PRN
  Administered 2020-09-03: 2 via TOPICAL

## 2020-09-03 MED ORDER — HEPARIN SODIUM (PORCINE) 1000 UNIT/ML DIALYSIS
1000.0000 [IU] | INTRAMUSCULAR | Status: DC | PRN
  Filled 2020-09-03: qty 1

## 2020-09-03 MED ORDER — PROPOFOL 10 MG/ML IV BOLUS
INTRAVENOUS | Status: DC | PRN
  Administered 2020-09-03: 20 mg via INTRAVENOUS
  Administered 2020-09-03: 30 mg via INTRAVENOUS

## 2020-09-03 MED ORDER — LIDOCAINE-PRILOCAINE 2.5-2.5 % EX CREA
1.0000 "application " | TOPICAL_CREAM | CUTANEOUS | Status: DC | PRN
  Filled 2020-09-03: qty 5

## 2020-09-03 MED ORDER — BICTEGRAVIR-EMTRICITAB-TENOFOV 50-200-25 MG PO TABS
1.0000 | ORAL_TABLET | Freq: Every day | ORAL | 6 refills | Status: DC
Start: 2020-09-03 — End: 2021-02-20

## 2020-09-03 MED ORDER — HEPARIN SODIUM (PORCINE) 1000 UNIT/ML DIALYSIS
20.0000 [IU]/kg | INTRAMUSCULAR | Status: DC | PRN
  Filled 2020-09-03: qty 2

## 2020-09-03 MED ORDER — ALTEPLASE 2 MG IJ SOLR: 2.0000 mg | Freq: Once | INTRAMUSCULAR | Status: DC | PRN

## 2020-09-03 MED ORDER — SODIUM CHLORIDE 0.9 % IV SOLN: 100.0000 mL | INTRAVENOUS | Status: DC | PRN

## 2020-09-03 MED ORDER — HEPARIN SODIUM (PORCINE) 1000 UNIT/ML DIALYSIS
5000.0000 [IU] | Freq: Once | INTRAMUSCULAR | Status: DC
  Administered 2020-09-04: 5000 [IU] via INTRAVENOUS_CENTRAL
  Filled 2020-09-03: qty 5

## 2020-09-03 MED ORDER — PROPOFOL 500 MG/50ML IV EMUL
INTRAVENOUS | Status: DC | PRN
  Administered 2020-09-03: 75 ug/kg/min via INTRAVENOUS

## 2020-09-03 MED ORDER — SODIUM CHLORIDE 0.9 % IV SOLN: INTRAVENOUS | Status: DC

## 2020-09-03 MED ORDER — ABACAVIR SULFATE 300 MG PO TABS
600.0000 mg | ORAL_TABLET | Freq: Every day | ORAL | Status: DC
  Filled 2020-09-03: qty 2

## 2020-09-03 MED ORDER — LIDOCAINE HCL (PF) 1 % IJ SOLN: 5.0000 mL | INTRAMUSCULAR | Status: DC | PRN

## 2020-09-03 NOTE — CV Procedure (Signed)
TEE  Patient sedated by anesthesia with Propofol intravenously Throat numbed with Cetacaine spray Bite guard placed TEE probe advanced to mid esophagus without difficulty  TEE without complications Full TEE report to follow in CV section of chart.  [name] MD

## 2020-09-03 NOTE — Progress Notes (Signed)
   [facility] has been requested to perform a transesophageal echocardiogram on Albert Eaton for severe mitral regurgitation.  After careful review of history and examination, the risks and benefits of transesophageal echocardiogram have been explained including risks of esophageal damage, perforation (1:10,000 risk), bleeding, pharyngeal hematoma as well as other potential complications associated with conscious sedation including aspiration, arrhythmia, respiratory failure and death. Alternatives to treatment were discussed, questions were answered. Patient is willing to proceed.   Patient is scheduled for procedure today at 12:30pm with Dr. Harrington Challenger.  Darreld Mclean, PA-C 09/03/2020 10:37 AM

## 2020-09-03 NOTE — Anesthesia Preprocedure Evaluation (Addendum)
Anesthesia Evaluation  Patient identified by MRN, date of birth, ID band Patient awake    Reviewed: Allergy & Precautions, NPO status , Patient's Chart, lab work & pertinent test results, reviewed documented beta blocker date and time   Airway Mallampati: I  TM Distance: >3 FB Neck ROM: Full    Dental  (+) Missing, Dental Advisory Given,    Pulmonary asthma , sleep apnea and Continuous Positive Airway Pressure Ventilation , former smoker,    Pulmonary exam normal breath sounds clear to auscultation       Cardiovascular hypertension, Pt. on medications and Pt. on home beta blockers +CHF  Normal cardiovascular exam+ Valvular Problems/Murmurs MR  Rhythm:Regular Rate:Normal  TTE 2021 1. Left ventricular ejection fraction, by estimation, is 60 to 65%. The left ventricle has normal function. The left ventricle has no regional wall motion abnormalities. The left ventricular internal cavity size was moderately dilated. There is mild left ventricular hypertrophy.  2. Right ventricular systolic function is normal. The right ventricular size is normal. There is moderately elevated pulmonary artery systolic pressure.  3. Left atrial size was severely dilated.  4. Right atrial size was mildly dilated.  5. MR jet is eccentrically directed along posterior wall of LA Would recomm TEE to further define mechanism. COmpared to echo from APril 2021, MR appears more severe.. The mitral valve is grossly normal. Severe mitral  valve regurgitation.  6. The aortic valve is normal in structure. Aortic valve regurgitation is trivial.  7. Aortic dilatation noted. There is mild dilatation of the aortic root, measuring 40 mm.  8. The inferior vena cava is normal in size with greater than 50% respiratory variability, suggesting right atrial pressure of 3 mmHg.   Stress Test 2021 Nuclear stress EF: 51%. The left ventricular ejection fraction is mildly  decreased (45-54%). The LV appears to be mildly dilated. There was no ST segment deviation noted during stress. This is a low risk study. There is no evidence of ischemia or previous infarction. The study is normal.    Neuro/Psych negative neurological ROS  negative psych ROS   GI/Hepatic negative GI ROS, (+) Hepatitis -, B  Endo/Other  Hypothyroidism   Renal/GU DialysisRenal disease (K 3.2, diaylsis MTThF)  negative genitourinary   Musculoskeletal negative musculoskeletal ROS (+)   Abdominal   Peds  Hematology  (+) Blood dyscrasia (Hgb 9.6), anemia , HIV,   Anesthesia Other Findings TEE to evaluate MR  Reproductive/Obstetrics                           Anesthesia Physical Anesthesia Plan  ASA: III  Anesthesia Plan: MAC   Post-op Pain Management:    Induction: Intravenous  PONV Risk Score and Plan: 1 and Propofol infusion and Treatment may vary due to age or medical condition  Airway Management Planned: Natural Airway  Additional Equipment:   Intra-op Plan:   Post-operative Plan:   Informed Consent: I have reviewed the patients History and Physical, chart, labs and discussed the procedure including the risks, benefits and alternatives for the proposed anesthesia with the patient or authorized representative who has indicated his/her understanding and acceptance.     Dental advisory given  Plan Discussed with: CRNA  Anesthesia Plan Comments:         Anesthesia Quick Evaluation

## 2020-09-03 NOTE — Progress Notes (Addendum)
Dose of abacavir should be 644m PO qday since it doesn't require any renal adjustment. Messaged outpt Rn to make aware and d/w Dr FAileen Fassto change dose.   Addendum  After talking with [facility] nurse MSharyn Lulland Dr. HJohnnye Sima we will [facility] MSharyn Lullwill send new rx to [facility] so he can pick up at discharge.  MOnnie Boer PharmD, BCIDP, AAHIVP, CPP Infectious Disease Pharmacist 09/03/2020 9:11 AM

## 2020-09-03 NOTE — H&P (View-Only) (Signed)
[facility] PROGRESS NOTE    Progress Note  Albert Eaton  IDP:[id number] DOB: [birth year] DOA: 08/30/2020 PCP: [name], FNP     Brief Narrative:   Albert Eaton is an 54 y.o. male past medical history of end-stage renal disease Monday Tuesday Fridays, refractory hypertension, chronic combined diastolic heart failure with a last EF on 02/14/2020 that showed an EF of 44% with diastolic dysfunction grade 2, HIV on [place] therapy comes in for shortness of breath that started about 2 weeks prior to admission with a dry cough and a fever supposedly sent from dialysis center to the ED chest x-ray was done that showed significant fluid overloaded hypertensive 1300 .  Assessment/Plan:   Acute respiratory failure with hypoxia of unclear etiology: Question pneumonia and/or pulmonary edema, he relates his breathing has improved. Nephrology has been consulted for HD. Keep n.p.o. for TEE today to rule out severe mitral regurgitation. H has remained afebrile, due to IV Rocephin and doxycycline.  He relates his cough is much better.  Uncontrolled hypertension/Hypertensive heart disease with CHF (congestive heart failure) ([place]): Improved control with Coreg and hydralazine, continue HD per renal. Blood pressure is fairly stable ranging 140/93-130 8/96.  Severe hypokalemia: Improved with HD.  HIV disease ([place]) Continue current medications.  Anemia in other chronic diseases: Per renal  End stage renal disease ([place]) Per renal   DVT prophylaxis: lovenox Family Communication:none Status is: Inpatient    Code Status:     Code Status Orders  (From admission, onward)         Start     Ordered   08/30/20 1748  Full code  Continuous        08/30/20 1749        Code Status History    Date Active Date Inactive Code Status Order ID Comments User Context   06/13/2018 2253 06/14/2018 1811 Full Code [id number]  Vianne Bulls, MD ED   06/07/2017 1600 06/08/2017 1600 Full Code  [id number]  Maryellen Pile, MD Inpatient   10/09/2016 0506 10/11/2016 1404 Full Code [id number]  Etta Quill, DO Inpatient   Advance Care Planning Activity        IV Access:    Peripheral IV   Procedures and diagnostic studies:   No results found.   Medical Consultants:    None.  Anti-Infectives:   Rocephin and azithro  Subjective:    Albert Eaton he relates he feels much better this morning.  Objective:    Vitals:   09/02/20 1207 09/02/20 2213 09/03/20 0409 09/03/20 0828  BP: 106/82 123/81 133/83   Pulse: 89 84 83   Resp: 20 18 20    Temp: 98.3 F (36.8 C) 99.1 F (37.3 C) 98 F (36.7 C)   TempSrc: Oral Oral Oral   SpO2: 98% 97% 96% 95%  Weight:   86.3 kg   Height:       SpO2: 95 %   Intake/Output Summary (Last 24 hours) at 09/03/2020 0914 Last data filed at 09/03/2020 8338 Gross per 24 hour  Intake 543 ml  Output 0 ml  Net 543 ml   Filed Weights   09/01/20 1040 09/02/20 0533 09/03/20 0409  Weight: 83.4 kg 83.4 kg 86.3 kg    Exam: General exam: In no acute distress. Respiratory system: Good air movement and clear to auscultation. Cardiovascular system: S1 & S2 heard, RRR. No JVD. Gastrointestinal system: Abdomen is nondistended, soft and nontender.  Extremities: No pedal edema. Skin: No rashes, lesions  or ulcers Data Reviewed:    Labs: Basic Metabolic Panel: Recent Labs  Lab 08/30/20 1113 08/30/20 2028 08/30/20 2238 08/30/20 2238 08/31/20 0120 08/31/20 0120 09/01/20 0422 09/01/20 0422 09/02/20 0246 09/03/20 0442  NA   < >  --  137  --  138  --  138  --  134* 136  K   < >  --  2.9*   < > 2.9*   < > 3.3*   < > 3.4* 3.2*  CL   < >  --  97*  --  101  --  100  --  95* 96*  CO2   < >  --  27  --  26  --  25  --  24 24  GLUCOSE   < >  --  125*  --  112*  --  99  --  94 97  BUN   < >  --  34*  --  34*  --  23*  --  24* 50*  CREATININE   < >  --  5.45*  --  5.65*  --  5.03*  --  5.84* 8.71*  CALCIUM   < >  --  10.4*  --  10.2   --  9.5  --  9.5 10.1  MG  --  1.8  --   --   --   --   --   --   --   --    < > = values in this interval not displayed.   GFR Estimated Creatinine Clearance: 10 mL/min (A) (by C-G formula based on SCr of 8.71 mg/dL (H)). Liver Function Tests: Recent Labs  Lab 08/30/20 1402  AST 14*  ALT 15  ALKPHOS 52  BILITOT 0.7  PROT 7.5  ALBUMIN 3.8   No results for input(s): LIPASE, AMYLASE in the last 168 hours. No results for input(s): AMMONIA in the last 168 hours. Coagulation profile No results for input(s): INR, PROTIME in the last 168 hours. COVID-19 Labs  No results for input(s): DDIMER, FERRITIN, LDH, CRP in the last 72 hours.  Lab Results  Component Value Date   SARSCOV2NAA NEGATIVE 08/30/2020   [place] NEGATIVE 08/15/2020    CBC: Recent Labs  Lab 08/30/20 1113 08/31/20 0631 09/02/20 1443 09/03/20 0442  WBC 10.5 7.3 7.3 7.1  NEUTROABS  --  4.7 4.1 3.3  HGB 9.4* 8.5* 10.4* 9.6*  HCT 27.4* 24.8* 28.9* 27.2*  MCV 99.3 102.1* 98.3 97.8  PLT 145* 144* 209 215   Cardiac Enzymes: No results for input(s): CKTOTAL, CKMB, CKMBINDEX, TROPONINI in the last 168 hours. BNP (last 3 results) No results for input(s): PROBNP in the last 8760 hours. CBG: No results for input(s): GLUCAP in the last 168 hours. D-Dimer: No results for input(s): DDIMER in the last 72 hours. Hgb A1c: No results for input(s): HGBA1C in the last 72 hours. Lipid Profile: No results for input(s): CHOL, HDL, LDLCALC, TRIG, CHOLHDL, LDLDIRECT in the last 72 hours. Thyroid function studies: No results for input(s): TSH, T4TOTAL, T3FREE, THYROIDAB in the last 72 hours.  Invalid input(s): FREET3 Anemia work up: No results for input(s): VITAMINB12, FOLATE, FERRITIN, TIBC, IRON, RETICCTPCT in the last 72 hours. Sepsis Labs: Recent Labs  Lab 08/30/20 1113 08/30/20 2028 08/31/20 0120 08/31/20 0631 09/01/20 0422 09/02/20 0246 09/02/20 1443 09/03/20 0442  PROCALCITON  --    < > 0.34 0.37 0.54  0.99  --   --   WBC 10.5  --   --  7.3  --   --  7.3 7.1   < > = values in this interval not displayed.   Microbiology Recent Results (from the past 240 hour(s))  Respiratory Panel by RT PCR (Flu A&B, Covid) - Nasopharyngeal Swab     Status: None   Collection Time: 08/30/20  2:02 PM   Specimen: Nasopharyngeal Swab  Result Value Ref Range Status   SARS Coronavirus 2 by RT PCR NEGATIVE NEGATIVE Final    Comment: (NOTE) SARS-CoV-2 target nucleic acids are NOT DETECTED.  The SARS-CoV-2 RNA is generally detectable in upper respiratoy specimens during the acute phase of infection. The lowest concentration of SARS-CoV-2 viral copies this assay can detect is 131 copies/mL. A negative result does not preclude SARS-Cov-2 infection and should not be used as the sole basis for treatment or other patient management decisions. A negative result may occur with  improper specimen collection/handling, submission of specimen other than nasopharyngeal swab, presence of viral mutation(s) within the areas targeted by this assay, and inadequate number of viral copies (<131 copies/mL). A negative result must be combined with clinical observations, patient history, and epidemiological information. The expected result is Negative.  Fact Sheet for Patients:  PinkCheek.be  Fact Sheet for Healthcare Providers:  GravelBags.it  This test is no t yet approved or cleared by the [place] FDA and  has been authorized for detection and/or diagnosis of SARS-CoV-2 by FDA under an Emergency Use Authorization (EUA). This EUA will remain  in effect (meaning this test can be used) for the duration of the COVID-19 declaration under Section 564(b)(1) of the Act, 21 U.S.C. section 360bbb-3(b)(1), unless the authorization is terminated or revoked sooner.     Influenza A by PCR NEGATIVE NEGATIVE Final   Influenza B by PCR NEGATIVE NEGATIVE Final    Comment:  (NOTE) The Xpert Xpress SARS-CoV-2/FLU/RSV assay is intended as an aid in  the diagnosis of influenza from Nasopharyngeal swab specimens and  should not be used as a sole basis for treatment. Nasal washings and  aspirates are unacceptable for Xpert Xpress SARS-CoV-2/FLU/RSV  testing.  Fact Sheet for Patients: PinkCheek.be  Fact Sheet for Healthcare Providers: GravelBags.it  This test is not yet approved or cleared by the [place] FDA and  has been authorized for detection and/or diagnosis of SARS-CoV-2 by  FDA under an Emergency Use Authorization (EUA). This EUA will remain  in effect (meaning this test can be used) for the duration of the  Covid-19 declaration under Section 564(b)(1) of the Act, 21  U.S.C. section 360bbb-3(b)(1), unless the authorization is  terminated or revoked. Performed at [facility], [place] [address] [place], [place] [address]   Culture, blood (routine x 2)     Status: None (Preliminary result)   Collection Time: 08/30/20  2:02 PM   Specimen: BLOOD RIGHT HAND  Result Value Ref Range Status   Specimen Description BLOOD RIGHT HAND  Final   Special Requests   Final    BOTTLES DRAWN AEROBIC AND ANAEROBIC Blood Culture adequate volume   Culture   Final    NO GROWTH 4 DAYS Performed at [facility], [place] [address], [place], [place] [address]    Report Status PENDING  Incomplete  Culture, blood (routine x 2)     Status: None (Preliminary result)   Collection Time: 08/30/20  2:02 PM   Specimen: BLOOD RIGHT HAND  Result Value Ref Range Status   Specimen Description BLOOD RIGHT HAND  Final  Special Requests   Final    BOTTLES DRAWN AEROBIC AND ANAEROBIC Blood Culture results may not be optimal due to an inadequate volume of blood received in culture bottles   Culture   Final    NO GROWTH 4 DAYS Performed at [facility], [place] [address] [place], [place] [address]    Report  Status PENDING  Incomplete  MRSA PCR Screening     Status: None   Collection Time: 08/31/20  1:28 AM   Specimen: Nasal Mucosa; Nasopharyngeal  Result Value Ref Range Status   MRSA by PCR NEGATIVE NEGATIVE Final    Comment:        The GeneXpert MRSA Assay (FDA approved for NASAL specimens only), is one component of a comprehensive MRSA colonization surveillance program. It is not intended to diagnose MRSA infection nor to guide or monitor treatment for MRSA infections. Performed at [facility], [place] [address] [place], [place] [address]      Medications:    (feeding supplement) PROSource Plus  30 mL Oral TID with meals   abacavir  600 mg Oral Daily   allopurinol  100 mg Oral BID   vitamin C with rose hips  1,000 mg Oral Daily   budesonide  0.25 mg Inhalation BID   calcitRIOL  0.5 mcg Oral Daily   carvedilol  25 mg Oral BID WC   Chlorhexidine Gluconate Cloth  6 each Topical Q0600   doxycycline  100 mg Oral Q12H   feeding supplement (NEPRO CARB STEADY)  237 mL Oral BID BM   gabapentin  300 mg Oral BID   heparin  5,000 Units Subcutaneous Q12H   isosorbide mononitrate  30 mg Oral Daily   lamiVUDine  100 mg Oral Daily   levothyroxine  25 mcg Oral Q0600   lubiprostone  24 mcg Oral BID WC   multivitamin  1 tablet Oral QPM   olopatadine  1 drop Both Eyes Daily   raltegravir  400 mg Oral BID   sodium chloride flush  3 mL Intravenous Q12H   sucroferric oxyhydroxide  500 mg Oral TID   Continuous Infusions:  sodium chloride     cefTRIAXone (ROCEPHIN)  IV 2 g (09/02/20 1303)      LOS: 4 days   Charlynne Cousins  [facility]  09/03/2020, 9:14 AM

## 2020-09-03 NOTE — Anesthesia Postprocedure Evaluation (Signed)
Anesthesia Post Note  Patient: Albert Eaton  Procedure(s) Performed: TRANSESOPHAGEAL ECHOCARDIOGRAM (TEE) (N/A ) BUBBLE STUDY     Patient location during evaluation: Endoscopy Anesthesia Type: MAC Level of consciousness: awake and alert Pain management: pain level controlled Vital Signs Assessment: post-procedure vital signs reviewed and stable Respiratory status: spontaneous breathing, nonlabored ventilation, respiratory function stable and patient connected to nasal cannula oxygen Cardiovascular status: blood pressure returned to baseline and stable Postop Assessment: no apparent nausea or vomiting Anesthetic complications: no   No complications documented.  Last Vitals:  Vitals:   09/03/20 1337 09/03/20 1344  BP: (!) 157/88 (!) 170/82  Pulse: 72 73  Resp: 11 19  Temp:    SpO2: 95% 94%    Last Pain:  Vitals:   09/03/20 1344  TempSrc:   PainSc: 0-No pain                 Dollie Mayse L Birney Belshe

## 2020-09-03 NOTE — H&P (View-Only) (Signed)
[facility] Progress Note   Subjective: Seen in room. Talking on phone - denies new issues, questions or concerns.  For TEE today then HD.   Objective Vitals:   09/02/20 0937 09/02/20 1207 09/02/20 2213 09/03/20 0409  BP:  106/82 123/81 133/83  Pulse: 91 89 84 83  Resp: 16 20 18 20   Temp:  98.3 F (36.8 C) 99.1 F (37.3 C) 98 F (36.7 C)  TempSrc:  Oral Oral Oral  SpO2: 96% 98% 97% 96%  Weight:    86.3 kg  Height:       Physical Exam General: WN, WD in NAD Heart: S1,S2 RRR Lungs: CTAB Abdomen: S, NT Extremities: No LE edema Dialysis Access:  L AVF +t/b, he requested to remove bandage himself so I left it intact    Additional Objective Labs: Basic Metabolic Panel: Recent Labs  Lab 09/01/20 0422 09/02/20 0246 09/03/20 0442  NA 138 134* 136  K 3.3* 3.4* 3.2*  CL 100 95* 96*  CO2 25 24 24   GLUCOSE 99 94 97  BUN 23* 24* 50*  CREATININE 5.03* 5.84* 8.71*  CALCIUM 9.5 9.5 10.1   Liver Function Tests: Recent Labs  Lab 08/30/20 1402  AST 14*  ALT 15  ALKPHOS 52  BILITOT 0.7  PROT 7.5  ALBUMIN 3.8   No results for input(s): LIPASE, AMYLASE in the last 168 hours. CBC: Recent Labs  Lab 08/30/20 1113 08/30/20 1113 08/31/20 0631 09/02/20 1443 09/03/20 0442  WBC 10.5   < > 7.3 7.3 7.1  NEUTROABS  --   --  4.7 4.1 3.3  HGB 9.4*   < > 8.5* 10.4* 9.6*  HCT 27.4*   < > 24.8* 28.9* 27.2*  MCV 99.3  --  102.1* 98.3 97.8  PLT 145*   < > 144* 209 215   < > = values in this interval not displayed.   Blo  Medications: . sodium chloride    . cefTRIAXone (ROCEPHIN)  IV 2 g (09/02/20 1303)   . (feeding supplement) PROSource Plus  30 mL Oral TID with meals  . abacavir  300 mg Oral Daily  . allopurinol  100 mg Oral BID  . vitamin C with rose hips  1,000 mg Oral Daily  . budesonide  0.25 mg Inhalation BID  . calcitRIOL  0.5 mcg Oral Daily  . carvedilol  25 mg Oral BID WC  . Chlorhexidine Gluconate Cloth  6 each Topical Q0600  . doxycycline   100 mg Oral Q12H  . feeding supplement (NEPRO CARB STEADY)  237 mL Oral BID BM  . gabapentin  300 mg Oral BID  . heparin  5,000 Units Subcutaneous Q12H  . isosorbide mononitrate  30 mg Oral Daily  . lamiVUDine  100 mg Oral Daily  . levothyroxine  25 mcg Oral Q0600  . lubiprostone  24 mcg Oral BID WC  . multivitamin  1 tablet Oral QPM  . olopatadine  1 drop Both Eyes Daily  . raltegravir  400 mg Oral BID  . sodium chloride flush  3 mL Intravenous Q12H  . sucroferric oxyhydroxide  500 mg Oral TID      OP HD: Home HD M-Tu-Th-Fri  3-3.5h  91kg  2K/45 lactate   -Hep 5000 unit IV per tx   LUE AVF BFR 450  - mircera 50 mcg IV q2 wk  - venofer 244m IVP per protocol.   Assessment/ Plan: 1. SOB - associated with fevers, chills, and cough. Pt had pulm edema  and likely also CAP. Much better, 6 kg down and 7 kg under his prior dry wt now. Getting abx for CAP. Going for TEE today.  BC NG X 2 days.  2.  Severe MR - by echo, for TEE today. 3. ESRD- On HHD. HD today, schedule around TEE.   4. Hypertension/volume- as above, lowered edw significantly upon dc, euvolemic now.  5. Anemia- HGB pending. cont with ESA and follow 6. Metabolic bone disease- Continue with outpatient meds 7. Nutrition- renal diet, low sodium - declining protein supplements. 8. HIV- continue with HAART therapy  Jannifer Hick MD [place] Pager [phone]

## 2020-09-03 NOTE — Progress Notes (Signed)
[facility] Progress Note   Subjective: Seen in room. Talking on phone - denies new issues, questions or concerns.  For TEE today then HD.   Objective Vitals:   09/02/20 0937 09/02/20 1207 09/02/20 2213 09/03/20 0409  BP:  106/82 123/81 133/83  Pulse: 91 89 84 83  Resp: 16 20 18 20   Temp:  98.3 F (36.8 C) 99.1 F (37.3 C) 98 F (36.7 C)  TempSrc:  Oral Oral Oral  SpO2: 96% 98% 97% 96%  Weight:    86.3 kg  Height:       Physical Exam General: WN, WD in NAD Heart: S1,S2 RRR Lungs: CTAB Abdomen: S, NT Extremities: No LE edema Dialysis Access:  L AVF +t/b, he requested to remove bandage himself so I left it intact    Additional Objective Labs: Basic Metabolic Panel: Recent Labs  Lab 09/01/20 0422 09/02/20 0246 09/03/20 0442  NA 138 134* 136  K 3.3* 3.4* 3.2*  CL 100 95* 96*  CO2 25 24 24   GLUCOSE 99 94 97  BUN 23* 24* 50*  CREATININE 5.03* 5.84* 8.71*  CALCIUM 9.5 9.5 10.1   Liver Function Tests: Recent Labs  Lab 08/30/20 1402  AST 14*  ALT 15  ALKPHOS 52  BILITOT 0.7  PROT 7.5  ALBUMIN 3.8   No results for input(s): LIPASE, AMYLASE in the last 168 hours. CBC: Recent Labs  Lab 08/30/20 1113 08/30/20 1113 08/31/20 0631 09/02/20 1443 09/03/20 0442  WBC 10.5   < > 7.3 7.3 7.1  NEUTROABS  --   --  4.7 4.1 3.3  HGB 9.4*   < > 8.5* 10.4* 9.6*  HCT 27.4*   < > 24.8* 28.9* 27.2*  MCV 99.3  --  102.1* 98.3 97.8  PLT 145*   < > 144* 209 215   < > = values in this interval not displayed.   Blo  Medications: . sodium chloride    . cefTRIAXone (ROCEPHIN)  IV 2 g (09/02/20 1303)   . (feeding supplement) PROSource Plus  30 mL Oral TID with meals  . abacavir  300 mg Oral Daily  . allopurinol  100 mg Oral BID  . vitamin C with rose hips  1,000 mg Oral Daily  . budesonide  0.25 mg Inhalation BID  . calcitRIOL  0.5 mcg Oral Daily  . carvedilol  25 mg Oral BID WC  . Chlorhexidine Gluconate Cloth  6 each Topical Q0600  . doxycycline   100 mg Oral Q12H  . feeding supplement (NEPRO CARB STEADY)  237 mL Oral BID BM  . gabapentin  300 mg Oral BID  . heparin  5,000 Units Subcutaneous Q12H  . isosorbide mononitrate  30 mg Oral Daily  . lamiVUDine  100 mg Oral Daily  . levothyroxine  25 mcg Oral Q0600  . lubiprostone  24 mcg Oral BID WC  . multivitamin  1 tablet Oral QPM  . olopatadine  1 drop Both Eyes Daily  . raltegravir  400 mg Oral BID  . sodium chloride flush  3 mL Intravenous Q12H  . sucroferric oxyhydroxide  500 mg Oral TID      OP HD: Home HD M-Tu-Th-Fri  3-3.5h  91kg  2K/45 lactate   -Hep 5000 unit IV per tx   LUE AVF BFR 450  - mircera 50 mcg IV q2 wk  - venofer 2110m IVP per protocol.   Assessment/ Plan: 1. SOB - associated with fevers, chills, and cough. Pt had pulm edema  and likely also CAP. Much better, 6 kg down and 7 kg under his prior dry wt now. Getting abx for CAP. Going for TEE today.  BC NG X 2 days.  2.  Severe MR - by echo, for TEE today. 3. ESRD- On HHD. HD today, schedule around TEE.   4. Hypertension/volume- as above, lowered edw significantly upon dc, euvolemic now.  5. Anemia- HGB pending. cont with ESA and follow 6. Metabolic bone disease- Continue with outpatient meds 7. Nutrition- renal diet, low sodium - declining protein supplements. 8. HIV- continue with HAART therapy  Jannifer Hick MD [place] Pager [phone]

## 2020-09-03 NOTE — Transfer of Care (Signed)
Immediate Anesthesia Transfer of Care Note  Patient: [name]  Procedure(s) Performed: TRANSESOPHAGEAL ECHOCARDIOGRAM (TEE) (N/A ) BUBBLE STUDY  Patient Location: Endoscopy Unit  Anesthesia Type:MAC  Level of Consciousness: drowsy and patient cooperative  Airway & Oxygen Therapy: Patient Spontanous Breathing  Post-op Assessment: Report given to RN, Post -op Vital signs reviewed and stable and Patient moving all extremities X 4  Post vital signs: Reviewed and stable  Last Vitals:  Vitals Value Taken Time  BP    Temp    Pulse 75 09/03/20 1326  Resp 13 09/03/20 1326  SpO2 97 % 09/03/20 1326  Vitals shown include unvalidated device data.  Last Pain:  Vitals:   09/03/20 1147  TempSrc: Oral  PainSc: 0-No pain         Complications: No complications documented.

## 2020-09-03 NOTE — Progress Notes (Addendum)
[facility] PROGRESS NOTE    Progress Note  Pete Schnitzer Allinson  RCV:[id number] DOB: 19-Sep-1966 DOA: 08/30/2020 PCP: [name], FNP     Brief Narrative:   Albert Eaton is an 54 y.o. male past medical history of end-stage renal disease Monday Tuesday Fridays, refractory hypertension, chronic combined diastolic heart failure with a last EF on 02/14/2020 that showed an EF of 10% with diastolic dysfunction grade 2, HIV on Hart therapy comes in for shortness of breath that started about 2 weeks prior to admission with a dry cough and a fever supposedly sent from dialysis center to the ED chest x-ray was done that showed significant fluid overloaded hypertensive 1300 .  Assessment/Plan:   Acute respiratory failure with hypoxia of unclear etiology: Question pneumonia and/or pulmonary edema, he relates his breathing has improved. Nephrology has been consulted for HD. Keep n.p.o. for TEE today to rule out severe mitral regurgitation. H has remained afebrile, due to IV Rocephin and doxycycline.  He relates his cough is much better.  Uncontrolled hypertension/Hypertensive heart disease with CHF (congestive heart failure) ([place]): Improved control with Coreg and hydralazine, continue HD per renal. Blood pressure is fairly stable ranging 140/93-130 8/96.  Severe hypokalemia: Improved with HD.  HIV disease ([place]) Continue current medications.  Anemia in other chronic diseases: Per renal  End stage renal disease ([place]) Per renal   DVT prophylaxis: lovenox Family Communication:none Status is: Inpatient    Code Status:     Code Status Orders  (From admission, onward)         Start     Ordered   08/30/20 1748  Full code  Continuous        08/30/20 1749        Code Status History    Date Active Date Inactive Code Status Order ID Comments User Context   06/13/2018 2253 06/14/2018 1811 Full Code [id number]  Vianne Bulls, MD ED   06/07/2017 1600 06/08/2017 1600 Full Code  [id number]  Maryellen Pile, MD Inpatient   10/09/2016 0506 10/11/2016 1404 Full Code [id number]  Etta Quill, DO Inpatient   Advance Care Planning Activity        IV Access:    Peripheral IV   Procedures and diagnostic studies:   No results found.   Medical Consultants:    None.  Anti-Infectives:   Rocephin and azithro  Subjective:    Nghia Mcentee Stankus he relates he feels much better this morning.  Objective:    Vitals:   09/02/20 1207 09/02/20 2213 09/03/20 0409 09/03/20 0828  BP: 106/82 123/81 133/83   Pulse: 89 84 83   Resp: 20 18 20    Temp: 98.3 F (36.8 C) 99.1 F (37.3 C) 98 F (36.7 C)   TempSrc: Oral Oral Oral   SpO2: 98% 97% 96% 95%  Weight:   86.3 kg   Height:       SpO2: 95 %   Intake/Output Summary (Last 24 hours) at 09/03/2020 0914 Last data filed at 09/03/2020 6195 Gross per 24 hour  Intake 543 ml  Output 0 ml  Net 543 ml   Filed Weights   09/01/20 1040 09/02/20 0533 09/03/20 0409  Weight: 83.4 kg 83.4 kg 86.3 kg    Exam: General exam: In no acute distress. Respiratory system: Good air movement and clear to auscultation. Cardiovascular system: S1 & S2 heard, RRR. No JVD. Gastrointestinal system: Abdomen is nondistended, soft and nontender.  Extremities: No pedal edema. Skin: No rashes, lesions  or ulcers Data Reviewed:    Labs: Basic Metabolic Panel: Recent Labs  Lab 08/30/20 1113 08/30/20 2028 08/30/20 2238 08/30/20 2238 08/31/20 0120 08/31/20 0120 09/01/20 0422 09/01/20 0422 09/02/20 0246 09/03/20 0442  NA   < >  --  137  --  138  --  138  --  134* 136  K   < >  --  2.9*   < > 2.9*   < > 3.3*   < > 3.4* 3.2*  CL   < >  --  97*  --  101  --  100  --  95* 96*  CO2   < >  --  27  --  26  --  25  --  24 24  GLUCOSE   < >  --  125*  --  112*  --  99  --  94 97  BUN   < >  --  34*  --  34*  --  23*  --  24* 50*  CREATININE   < >  --  5.45*  --  5.65*  --  5.03*  --  5.84* 8.71*  CALCIUM   < >  --  10.4*  --  10.2   --  9.5  --  9.5 10.1  MG  --  1.8  --   --   --   --   --   --   --   --    < > = values in this interval not displayed.   GFR Estimated Creatinine Clearance: 10 mL/min (A) (by C-G formula based on SCr of 8.71 mg/dL (H)). Liver Function Tests: Recent Labs  Lab 08/30/20 1402  AST 14*  ALT 15  ALKPHOS 52  BILITOT 0.7  PROT 7.5  ALBUMIN 3.8   No results for input(s): LIPASE, AMYLASE in the last 168 hours. No results for input(s): AMMONIA in the last 168 hours. Coagulation profile No results for input(s): INR, PROTIME in the last 168 hours. COVID-19 Labs  No results for input(s): DDIMER, FERRITIN, LDH, CRP in the last 72 hours.  Lab Results  Component Value Date   SARSCOV2NAA NEGATIVE 08/30/2020   [place] NEGATIVE 08/15/2020    CBC: Recent Labs  Lab 08/30/20 1113 08/31/20 0631 09/02/20 1443 09/03/20 0442  WBC 10.5 7.3 7.3 7.1  NEUTROABS  --  4.7 4.1 3.3  HGB 9.4* 8.5* 10.4* 9.6*  HCT 27.4* 24.8* 28.9* 27.2*  MCV 99.3 102.1* 98.3 97.8  PLT 145* 144* 209 215   Cardiac Enzymes: No results for input(s): CKTOTAL, CKMB, CKMBINDEX, TROPONINI in the last 168 hours. BNP (last 3 results) No results for input(s): PROBNP in the last 8760 hours. CBG: No results for input(s): GLUCAP in the last 168 hours. D-Dimer: No results for input(s): DDIMER in the last 72 hours. Hgb A1c: No results for input(s): HGBA1C in the last 72 hours. Lipid Profile: No results for input(s): CHOL, HDL, LDLCALC, TRIG, CHOLHDL, LDLDIRECT in the last 72 hours. Thyroid function studies: No results for input(s): TSH, T4TOTAL, T3FREE, THYROIDAB in the last 72 hours.  Invalid input(s): FREET3 Anemia work up: No results for input(s): VITAMINB12, FOLATE, FERRITIN, TIBC, IRON, RETICCTPCT in the last 72 hours. Sepsis Labs: Recent Labs  Lab 08/30/20 1113 08/30/20 2028 08/31/20 0120 08/31/20 0631 09/01/20 0422 09/02/20 0246 09/02/20 1443 09/03/20 0442  PROCALCITON  --    < > 0.34 0.37 0.54  0.99  --   --   WBC 10.5  --   --  7.3  --   --  7.3 7.1   < > = values in this interval not displayed.   Microbiology Recent Results (from the past 240 hour(s))  Respiratory Panel by RT PCR (Flu A&B, Covid) - Nasopharyngeal Swab     Status: None   Collection Time: 08/30/20  2:02 PM   Specimen: Nasopharyngeal Swab  Result Value Ref Range Status   SARS Coronavirus 2 by RT PCR NEGATIVE NEGATIVE Final    Comment: (NOTE) SARS-CoV-2 target nucleic acids are NOT DETECTED.  The SARS-CoV-2 RNA is generally detectable in upper respiratoy specimens during the acute phase of infection. The lowest concentration of SARS-CoV-2 viral copies this assay can detect is 131 copies/mL. A negative result does not preclude SARS-Cov-2 infection and should not be used as the sole basis for treatment or other patient management decisions. A negative result may occur with  improper specimen collection/handling, submission of specimen other than nasopharyngeal swab, presence of viral mutation(s) within the areas targeted by this assay, and inadequate number of viral copies (<131 copies/mL). A negative result must be combined with clinical observations, patient history, and epidemiological information. The expected result is Negative.  Fact Sheet for Patients:  PinkCheek.be  Fact Sheet for Healthcare Providers:  GravelBags.it  This test is no t yet approved or cleared by the [place] FDA and  has been authorized for detection and/or diagnosis of SARS-CoV-2 by FDA under an Emergency Use Authorization (EUA). This EUA will remain  in effect (meaning this test can be used) for the duration of the COVID-19 declaration under Section 564(b)(1) of the Act, 21 U.S.C. section 360bbb-3(b)(1), unless the authorization is terminated or revoked sooner.     Influenza A by PCR NEGATIVE NEGATIVE Final   Influenza B by PCR NEGATIVE NEGATIVE Final    Comment:  (NOTE) The Xpert Xpress SARS-CoV-2/FLU/RSV assay is intended as an aid in  the diagnosis of influenza from Nasopharyngeal swab specimens and  should not be used as a sole basis for treatment. Nasal washings and  aspirates are unacceptable for Xpert Xpress SARS-CoV-2/FLU/RSV  testing.  Fact Sheet for Patients: PinkCheek.be  Fact Sheet for Healthcare Providers: GravelBags.it  This test is not yet approved or cleared by the [place] FDA and  has been authorized for detection and/or diagnosis of SARS-CoV-2 by  FDA under an Emergency Use Authorization (EUA). This EUA will remain  in effect (meaning this test can be used) for the duration of the  Covid-19 declaration under Section 564(b)(1) of the Act, 21  U.S.C. section 360bbb-3(b)(1), unless the authorization is  terminated or revoked. Performed at [facility], [place] [address] [place], [place] [address]   Culture, blood (routine x 2)     Status: None (Preliminary result)   Collection Time: 08/30/20  2:02 PM   Specimen: BLOOD RIGHT HAND  Result Value Ref Range Status   Specimen Description BLOOD RIGHT HAND  Final   Special Requests   Final    BOTTLES DRAWN AEROBIC AND ANAEROBIC Blood Culture adequate volume   Culture   Final    NO GROWTH 4 DAYS Performed at [facility], [place] [address], [place], [place] [address]    Report Status PENDING  Incomplete  Culture, blood (routine x 2)     Status: None (Preliminary result)   Collection Time: 08/30/20  2:02 PM   Specimen: BLOOD RIGHT HAND  Result Value Ref Range Status   Specimen Description BLOOD RIGHT HAND  Final  Special Requests   Final    BOTTLES DRAWN AEROBIC AND ANAEROBIC Blood Culture results may not be optimal due to an inadequate volume of blood received in culture bottles   Culture   Final    NO GROWTH 4 DAYS Performed at [facility], [place] [address], [place], [place] [address]    Report  Status PENDING  Incomplete  MRSA PCR Screening     Status: None   Collection Time: 08/31/20  1:28 AM   Specimen: Nasal Mucosa; Nasopharyngeal  Result Value Ref Range Status   MRSA by PCR NEGATIVE NEGATIVE Final    Comment:        The GeneXpert MRSA Assay (FDA approved for NASAL specimens only), is one component of a comprehensive MRSA colonization surveillance program. It is not intended to diagnose MRSA infection nor to guide or monitor treatment for MRSA infections. Performed at [facility], [place] [address], [place], [place] [address]      Medications:   . (feeding supplement) PROSource Plus  30 mL Oral TID with meals  . abacavir  600 mg Oral Daily  . allopurinol  100 mg Oral BID  . vitamin C with rose hips  1,000 mg Oral Daily  . budesonide  0.25 mg Inhalation BID  . calcitRIOL  0.5 mcg Oral Daily  . carvedilol  25 mg Oral BID WC  . Chlorhexidine Gluconate Cloth  6 each Topical Q0600  . doxycycline  100 mg Oral Q12H  . feeding supplement (NEPRO CARB STEADY)  237 mL Oral BID BM  . gabapentin  300 mg Oral BID  . heparin  5,000 Units Subcutaneous Q12H  . isosorbide mononitrate  30 mg Oral Daily  . lamiVUDine  100 mg Oral Daily  . levothyroxine  25 mcg Oral Q0600  . lubiprostone  24 mcg Oral BID WC  . multivitamin  1 tablet Oral QPM  . olopatadine  1 drop Both Eyes Daily  . raltegravir  400 mg Oral BID  . sodium chloride flush  3 mL Intravenous Q12H  . sucroferric oxyhydroxide  500 mg Oral TID   Continuous Infusions: . sodium chloride    . cefTRIAXone (ROCEPHIN)  IV 2 g (09/02/20 1303)      LOS: 4 days   Charlynne Cousins  [facility]  09/03/2020, 9:14 AM

## 2020-09-03 NOTE — Telephone Encounter (Addendum)
Alterted to Ziagen prescription dosing by [facility], Pharm-D.  Should be 600 mg daily. Relayed to Dr Johnnye Sima who is ok to switch patient to [place].  Will route back to [facility] for prescribing while patient has in-patient status. Landis Gandy, RN

## 2020-09-03 NOTE — Interval H&P Note (Signed)
History and Physical Interval Note:  09/03/2020 11:59 AM  Albert Eaton  has presented today for surgery, with the diagnosis of severe MR.  The various methods of treatment have been discussed with the patient and family. After consideration of risks, benefits and other options for treatment, the patient has consented to  Procedure(s): TRANSESOPHAGEAL ECHOCARDIOGRAM (TEE) (N/A) as a surgical intervention.  The patient's history has been reviewed, patient examined, no change in status, stable for surgery.  I have reviewed the patient's chart and labs.  Questions were answered to the patient's satisfaction.     [name]

## 2020-09-03 NOTE — Progress Notes (Signed)
  Echocardiogram Echocardiogram Transesophageal has been performed.  Geoffery Lyons Swaim 09/03/2020, 1:25 PM

## 2020-09-03 NOTE — Interval H&P Note (Signed)
History and Physical Interval Note:  09/03/2020 12:39 PM  Albert Eaton  has presented today for surgery, with the diagnosis of severe MR.  The various methods of treatment have been discussed with the patient and family. After consideration of risks, benefits and other options for treatment, the patient has consented to  Procedure(s): TRANSESOPHAGEAL ECHOCARDIOGRAM (TEE) (N/A) as a surgical intervention.  The patient's history has been reviewed, patient examined, no change in status, stable for surgery.  I have reviewed the patient's chart and labs.  Questions were answered to the patient's satisfaction.     [name]

## 2020-09-04 DIAGNOSIS — N2581 Secondary hyperparathyroidism of renal origin: Secondary | ICD-10-CM | POA: Diagnosis not present

## 2020-09-04 DIAGNOSIS — I5031 Acute diastolic (congestive) heart failure: Secondary | ICD-10-CM | POA: Diagnosis not present

## 2020-09-04 DIAGNOSIS — E8770 Fluid overload, unspecified: Secondary | ICD-10-CM | POA: Diagnosis not present

## 2020-09-04 DIAGNOSIS — Z992 Dependence on renal dialysis: Secondary | ICD-10-CM | POA: Diagnosis not present

## 2020-09-04 DIAGNOSIS — I340 Nonrheumatic mitral (valve) insufficiency: Secondary | ICD-10-CM

## 2020-09-04 DIAGNOSIS — E440 Moderate protein-calorie malnutrition: Secondary | ICD-10-CM | POA: Diagnosis not present

## 2020-09-04 DIAGNOSIS — D631 Anemia in chronic kidney disease: Secondary | ICD-10-CM | POA: Diagnosis not present

## 2020-09-04 DIAGNOSIS — D638 Anemia in other chronic diseases classified elsewhere: Secondary | ICD-10-CM | POA: Diagnosis not present

## 2020-09-04 DIAGNOSIS — J189 Pneumonia, unspecified organism: Secondary | ICD-10-CM | POA: Diagnosis not present

## 2020-09-04 DIAGNOSIS — K7689 Other specified diseases of liver: Secondary | ICD-10-CM | POA: Diagnosis not present

## 2020-09-04 DIAGNOSIS — Z79899 Other long term (current) drug therapy: Secondary | ICD-10-CM | POA: Diagnosis not present

## 2020-09-04 DIAGNOSIS — D509 Iron deficiency anemia, unspecified: Secondary | ICD-10-CM | POA: Diagnosis not present

## 2020-09-04 DIAGNOSIS — N186 End stage renal disease: Secondary | ICD-10-CM | POA: Diagnosis not present

## 2020-09-04 DIAGNOSIS — R82998 Other abnormal findings in urine: Secondary | ICD-10-CM | POA: Diagnosis not present

## 2020-09-04 DIAGNOSIS — E876 Hypokalemia: Secondary | ICD-10-CM | POA: Diagnosis not present

## 2020-09-04 DIAGNOSIS — Z4931 Encounter for adequacy testing for hemodialysis: Secondary | ICD-10-CM | POA: Diagnosis not present

## 2020-09-04 LAB — CULTURE, BLOOD (ROUTINE X 2)
Culture: NO GROWTH
Culture: NO GROWTH
Special Requests: ADEQUATE

## 2020-09-04 LAB — CBC WITH DIFFERENTIAL/PLATELET
Abs Immature Granulocytes: 0.03 10*3/uL (ref 0.00–0.07)
Basophils Absolute: 0.1 10*3/uL (ref 0.0–0.1)
Basophils Relative: 1 %
Eosinophils Absolute: 0.2 10*3/uL (ref 0.0–0.5)
Eosinophils Relative: 4 %
HCT: 26.5 % — ABNORMAL LOW (ref 39.0–52.0)
Hemoglobin: 9.2 g/dL — ABNORMAL LOW (ref 13.0–17.0)
Immature Granulocytes: 1 %
Lymphocytes Relative: 31 %
Lymphs Abs: 1.9 10*3/uL (ref 0.7–4.0)
MCH: 33.9 pg (ref 26.0–34.0)
MCHC: 34.7 g/dL (ref 30.0–36.0)
MCV: 97.8 fL (ref 80.0–100.0)
Monocytes Absolute: 0.7 10*3/uL (ref 0.1–1.0)
Monocytes Relative: 12 %
Neutro Abs: 3.2 10*3/uL (ref 1.7–7.7)
Neutrophils Relative %: 51 %
Platelets: 217 10*3/uL (ref 150–400)
RBC: 2.71 MIL/uL — ABNORMAL LOW (ref 4.22–5.81)
RDW: 12 % (ref 11.5–15.5)
WBC: 6 10*3/uL (ref 4.0–10.5)
nRBC: 0 % (ref 0.0–0.2)

## 2020-09-04 LAB — BASIC METABOLIC PANEL
Anion gap: 18 — ABNORMAL HIGH (ref 5–15)
BUN: 65 mg/dL — ABNORMAL HIGH (ref 6–20)
CO2: 23 mmol/L (ref 22–32)
Calcium: 9.5 mg/dL (ref 8.9–10.3)
Chloride: 98 mmol/L (ref 98–111)
Creatinine, Ser: 9.74 mg/dL — ABNORMAL HIGH (ref 0.61–1.24)
GFR, Estimated: 6 mL/min — ABNORMAL LOW (ref >60)
Glucose, Bld: 103 mg/dL — ABNORMAL HIGH (ref 70–99)
Potassium: 3.3 mmol/L — ABNORMAL LOW (ref 3.5–5.1)
Sodium: 139 mmol/L (ref 135–145)

## 2020-09-04 NOTE — Progress Notes (Signed)
Hemodialysis- System clotted with 20 minutes remaining (venous chamber filter) despite heparin bolus and flushing system x1. Partial venous line blood loss. Less than 100cc loss. Reported off to primary RN. All vitals stable post rinseback. Post weight 85.2kg on standing scale.

## 2020-09-04 NOTE — Progress Notes (Signed)
Albert Eaton Progress Note   Subjective: Seen in room. Talking on phone - denies new issues, questions or concerns. Cancelled care partner for home hemo today so needs HD prior to d/c.  Objective Vitals:   09/03/20 2103 09/03/20 2326 09/04/20 0422 09/04/20 0806  BP: 116/83  111/81   Pulse: 79 80 70   Resp: 20 20 18    Temp: 98.9 F (37.2 C)  98.1 F (36.7 C)   TempSrc: Oral  Oral   SpO2: 97% 96% 96% 98%  Weight:   87.3 kg   Height:       Physical Exam General: WN, WD in NAD Heart: S1,S2 RRR Lungs: CTAB Abdomen: S, NT Extremities: No LE edema Dialysis Access:  L AVF +t/b    Additional Objective Labs: Basic Metabolic Panel: Recent Labs  Lab 09/03/20 0442 09/03/20 1817 09/04/20 0359  NA 136 135 139  K 3.2* 2.9* 3.3*  CL 96* 96* 98  CO2 24 23 23   GLUCOSE 97 125* 103*  BUN 50* 57* 65*  CREATININE 8.71* 9.23* 9.74*  CALCIUM 10.1 9.6 9.5  PHOS  --  5.0*  --    Liver Function Tests: Recent Labs  Lab 08/30/20 1402 09/03/20 1817  AST 14*  --   ALT 15  --   ALKPHOS 52  --   BILITOT 0.7  --   PROT 7.5  --   ALBUMIN 3.8 3.4*   No results for input(s): LIPASE, AMYLASE in the last 168 hours. CBC: Recent Labs  Lab 08/31/20 0631 08/31/20 0631 09/02/20 1443 09/02/20 1443 09/03/20 0442 09/03/20 1817 09/04/20 0359  WBC 7.3   < > 7.3   < > 7.1 6.1 6.0  NEUTROABS 4.7   < > 4.1  --  3.3  --  3.2  HGB 8.5*   < > 10.4*   < > 9.6* 9.6* 9.2*  HCT 24.8*   < > 28.9*   < > 27.2* 26.8* 26.5*  MCV 102.1*  --  98.3  --  97.8 96.4 97.8  PLT 144*   < > 209   < > 215 228 217   < > = values in this interval not displayed.   Blo  Medications: . sodium chloride    . sodium chloride    . sodium chloride    . cefTRIAXone (ROCEPHIN)  IV 2 g (09/03/20 1447)   . (feeding supplement) PROSource Plus  30 mL Oral TID with meals  . allopurinol  100 mg Oral BID  . vitamin C with rose hips  1,000 mg Oral Daily  . bictegravir-emtricitabine-tenofovir AF  1 tablet  Oral Daily  . budesonide  0.25 mg Inhalation BID  . calcitRIOL  0.5 mcg Oral Daily  . carvedilol  25 mg Oral BID WC  . doxycycline  100 mg Oral Q12H  . feeding supplement (NEPRO CARB STEADY)  237 mL Oral BID BM  . gabapentin  300 mg Oral BID  . heparin  5,000 Units Subcutaneous Q12H  . heparin  5,000 Units Dialysis Once in dialysis  . isosorbide mononitrate  30 mg Oral Daily  . levothyroxine  25 mcg Oral Q0600  . lubiprostone  24 mcg Oral BID WC  . multivitamin  1 tablet Oral QPM  . olopatadine  1 drop Both Eyes Daily  . sodium chloride flush  3 mL Intravenous Q12H  . sucroferric oxyhydroxide  500 mg Oral TID      OP HD: Home HD M-Tu-Th-Fri  3-3.5h  91kg  2K/45 lactate   -Hep 5000 unit IV per tx   LUE AVF BFR 450  - mircera 50 mcg IV q2 wk  - venofer 255m IVP per protocol.   Assessment/ Plan: 1. SOB - associated with fevers, chills, and cough. Pt had pulm edema and likely also CAP. Much better, well under prior EDW now. Getting abx for CAP. BC NG X 3 days.  2.  Severe MR - by echo, TEE with mod to severe MR and cardiology will f/u outpt.  3. ESRD- On HHD. HD today prior to discharge then resume usual schedule.   4. Hypertension/volume- as above, lowered edw significantly upon dc, euvolemic now.  5. Anemia- HGB 9.2. cont with ESA and follow 6. Metabolic bone disease- Continue with outpatient meds 7. Nutrition- renal diet, low sodium - declining protein supplements. 8. HIV- continue with HAART therapy  Albert HickMD CTalking RockPager [phone]

## 2020-09-04 NOTE — Consult Note (Addendum)
Cardiology Consultation:   Patient ID: Albert Eaton; [id number]; [birth year]   Admit date: 08/30/2020 Date of Consult: 09/04/2020  Primary Care Provider: [name], FNP Primary Cardiologist: [name], MD 01/12/2020 Primary Electrophysiologist:  None   Patient Profile:   Albert Eaton is a 54 y.o. male with a hx of ESRD on home HD w/ HD M-Tu-Th-Fri schedule, refractory HTN, chronic D-CHF w/ Echo 02/14/2020 w/ EF 60% & grade 2 dd, HIV on [place] therapy, Crohn's dz, who is being seen today for the evaluation of severe Albert at the request of Dr Aileen Fass.  History of Present Illness:   Albert Eaton came to the ER 10/19 with abdominal pain. H&H slightly lower than normal, no acute cause found.   Came to the ER 10/21 with SOB, chest pressure not clearly cardiac, dry cough, Temp 102.4, COVID negative. He had missed HD. He was evaluated and the CP/SOB were felt 2nd volume overload, was scheduled for full HD.  Pt came to the ER 11/04 with recurrent fever and cough, SOB, chest wall pain from coughing, occ N&V, bibasilar opacities on CXR >> admit for CAP.  He had more CP after admission, improved w/ SL NTG, but felt non-cardiac. Echo w/ ? Severe Albert, TEE requested. TEE performed, Albert mod-severe >> Cards asked to see.   Albert Eaton has only had CP in the setting of volume overload or lying on his L side. No hx exertional sx, he feels like he does pretty well as long as he keeps his volume down.   Had volume exacerbations in the setting of missed HD or CAP, but otherwise, has been pretty stable on home HD. No LE edema.  No palpitations.   No presyncope or syncope. Gets light-headed in orthostatic fashion after HD, but not otherwise.   Past Medical History:  Diagnosis Date  . Anemia   . Aortic atherosclerosis ([place])   . Asthma   . Chronic diastolic CHF (congestive heart failure) ([place]) 01/06/2017   Echo 7/16 [facility] in [place], [name]) Mild AI, mild LAE, mild concentric LVH, EF 55,  normal wall motion, mild to moderate Albert, mild PI, RVSP 55 // Echo 10/09/16 ([facility]):  Moderate LVH, grade 2 diastolic dysfunction, mild Albert, moderate LAE   . CKD (chronic kidney disease)    per pt, waiting on a kidney transplant in near future.  . Esophagitis   . Gout   . Hepatitis    Hep B  . History of nuclear stress test    a. Nuc study 7/16: no scar or ischemia, EF 42 // b. Nuc study 12/17: EF 48, ?small apical ischemia, Low Risk  . HIV (human immunodeficiency virus infection) ([place])   . HLD (hyperlipidemia)   . Hypertension   . Hypertensive heart disease with CHF (congestive heart failure) ([place]) 10/09/2016  . Mitral regurgitation   . OSA (obstructive sleep apnea)   . Post-operative nausea and vomiting    1 time as a child  . Sleep apnea    uses c-pap  . Wears glasses   . Wears partial dentures     Past Surgical History:  Procedure Laterality Date  . AV FISTULA PLACEMENT Left 07/02/2018   Procedure: Creation of Left arm BRACHIOBASILIC ARTERIOVENOUS  FISTULA;  Surgeon: Marty Heck, MD;  Location: [place];  Service: Vascular;  Laterality: Left;  . BASCILIC VEIN TRANSPOSITION Left 08/27/2018   Procedure: Left arm BRACHIOBASILIC VEIN TRANSPOSITION SECOND STAGE;  Surgeon: Marty Heck, MD;  Location: [place];  Service: Vascular;  Laterality: Left;  . CHOLECYSTECTOMY    . COLONOSCOPY W/ BIOPSIES AND POLYPECTOMY    . GIVENS CAPSULE STUDY N/A 03/01/2018   Procedure: GIVENS CAPSULE STUDY;  Surgeon: Jerene Bears, MD;  Location: [place];  Service: Gastroenterology;  Laterality: N/A;  . HERNIA REPAIR     As baby  . MULTIPLE TOOTH EXTRACTIONS    . NOSE SURGERY    . RENAL BIOPSY       Prior to Admission medications   Medication Sig Start Date End Date Taking? Authorizing Provider  abacavir (ZIAGEN) 300 MG tablet Take 1 tablet (300 mg total) by mouth daily. 06/04/20  Yes Campbell Riches, MD  albuterol (VENTOLIN HFA) 108 (90 Base) MCG/ACT inhaler INHALE 2 PUFFS INTO THE  LUNGS EVERY 4 HOURS AS NEEDED FOR WHEEZING OR SHORTNESS OF BREATH Patient taking differently: Inhale 2 puffs into the lungs every 4 (four) hours as needed for wheezing or shortness of breath.  08/13/20  Yes Valentina Shaggy, MD  allopurinol (ZYLOPRIM) 100 MG tablet Take 1 tablet (100 mg total) by mouth 2 (two) times daily. 05/25/20  Yes [name], FNP  amLODipine (NORVASC) 10 MG tablet Take 1 tablet (10 mg total) by mouth daily. 10/31/16  Yes [name], FNP  Ascorbic Acid (VITAMIN C WITH ROSE HIPS) 1000 MG tablet Take 1,000 mg by mouth daily.   Yes [provider]  azelastine (ASTELIN) 0.1 % nasal spray Place 2 sprays into both nostrils 2 (two) times daily as needed for rhinitis. Use in each nostril as directed Patient taking differently: Place 2 sprays into both nostrils 2 (two) times daily as needed for rhinitis.  05/24/20  Yes Valentina Shaggy, MD  B Complex-C-Zn-Folic Acid (DIALYVITE/ZINC) TABS Take 1 tablet by mouth every evening.  06/14/19  Yes [provider]  calcitRIOL (ROCALTROL) 0.5 MCG capsule Take 0.5 mcg by mouth daily.  04/05/20  Yes [provider]  carvedilol (COREG) 25 MG tablet TAKE 1 TABLET BY MOUTH TWICE DAILY WITH FOOD Patient taking differently: Take 25 mg by mouth 2 (two) times daily with a meal.  08/14/20  Yes [name], MD  famotidine (PEPCID) 20 MG tablet TAKE 1 TABLET(20 MG) BY MOUTH TWICE DAILY Patient taking differently: Take 20 mg by mouth every evening.  02/29/20  Yes [name], MD  fluticasone (FLOVENT HFA) 110 MCG/ACT inhaler Inhale 2 puffs into the lungs 2 (two) times daily. 05/24/20  Yes Valentina Shaggy, MD  gabapentin (NEURONTIN) 300 MG capsule TAKE 1 CAPSULE(300 MG) BY MOUTH THREE TIMES DAILY Patient taking differently: Take 300 mg by mouth 3 (three) times daily. TAKE 1 CAPSULE(300 MG) BY MOUTH THREE TIMES DAILY 05/21/20  Yes [name], FNP  hydrALAZINE (APRESOLINE) 100 MG tablet TAKE 1 TABLET  BY MOUTH THREE TIMES DAILY Patient taking differently: Take 100 mg by mouth 3 (three) times daily.  02/01/20  Yes [name], MD  hyoscyamine (LEVSIN SL) 0.125 MG SL tablet Place 1-2 tablets (0.125-0.25 mg total) under the tongue every 8 (eight) hours as needed (lower abdominal pain, spasm). Patient taking differently: Place 0.125-0.25 mg under the tongue every 8 (eight) hours as needed for cramping (lower abdominal pain, spasm).  08/05/19  Yes Pyrtle, Lajuan Lines, MD  ipratropium (ATROVENT) 0.06 % nasal spray USE 2 SPRAYS IN EACH NOSTRIL 2 TO 3 TIMES DAILY AS NEEDED 09/03/20   Valentina Shaggy, MD  lactulose [facility]) 10 GM/15ML solution Take 20 g by mouth daily as  needed for moderate constipation.  07/08/20  Yes [provider]  lamivudine (EPIVIR) 100 MG tablet TAKE 1 TABLET(100 MG) BY MOUTH DAILY Patient taking differently: Take 100 mg by mouth daily.  06/04/20  Yes Campbell Riches, MD  levothyroxine (SYNTHROID) 25 MCG tablet Take 1 tablet by mouth daily before breakfast Patient taking differently: Take 25 mcg by mouth daily before breakfast. Take 1 tablet by mouth daily before breakfast 05/21/20  Yes [name], FNP  lidocaine-prilocaine (EMLA) cream Apply 1 application topically See admin instructions. Apply to access site 1-2 hours before dialysis 06/01/19  Yes [provider]  lubiprostone (AMITIZA) 24 MCG capsule TAKE 1 CAPSULE(24 MCG) BY MOUTH TWICE DAILY WITH A MEAL Patient taking differently: Take 24 mcg by mouth 2 (two) times daily with a meal.  05/29/20  Yes Pyrtle, Lajuan Lines, MD  mupirocin ointment (BACTROBAN) 2 % Apply 1 application topically daily as needed (apply to port after dialysis).  04/24/20  Yes [provider]  Olopatadine HCl (PAZEO) 0.7 % SOLN Place 1 drop into both eyes daily. 05/24/20  Yes Valentina Shaggy, MD  raltegravir (ISENTRESS) 400 MG tablet Take 1 tablet (400 mg total) by mouth 2 (two) times daily. 06/04/20  Yes Campbell Riches, MD    traMADol (ULTRAM) 50 MG tablet Take 1 tablet (50 mg total) by mouth every 6 (six) hours as needed for moderate pain. 08/14/20  Yes Levin Erp, PA  Turmeric 500 MG CAPS Take 500 mg by mouth daily.   Yes [provider]  VELPHORO 500 MG chewable tablet Chew 500 mg by mouth 3 (three) times daily. 04/26/20  Yes [provider]  Vitamin D, Ergocalciferol, (DRISDOL) 1.25 MG (50000 UNIT) CAPS capsule TAKE 1 CAPSULE BY MOUTH EVERY 7 DAYS Patient taking differently: Take 50,000 Units by mouth every Tuesday.  07/11/20  Yes [name], FNP  vitamin E (VITAMIN E) 180 MG (400 UNITS) capsule Take 400 Units by mouth daily.    Yes [provider]  zinc gluconate 50 MG tablet Take 50 mg by mouth at bedtime.    Yes [provider]  bictegravir-emtricitabine-tenofovir AF (BIKTARVY) 50-200-25 MG TABS tablet Take 1 tablet by mouth daily. 09/03/20   Campbell Riches, MD  Spacer/Aero-Holding Josiah Lobo DEVI 1 Device by Does not apply route 2 (two) times a day. 03/01/19   Bobbitt, Sedalia Muta, MD    Inpatient Medications: Scheduled Meds: . (feeding supplement) PROSource Plus  30 mL Oral TID with meals  . allopurinol  100 mg Oral BID  . vitamin C with rose hips  1,000 mg Oral Daily  . bictegravir-emtricitabine-tenofovir AF  1 tablet Oral Daily  . budesonide  0.25 mg Inhalation BID  . calcitRIOL  0.5 mcg Oral Daily  . carvedilol  25 mg Oral BID WC  . doxycycline  100 mg Oral Q12H  . feeding supplement (NEPRO CARB STEADY)  237 mL Oral BID BM  . gabapentin  300 mg Oral BID  . heparin  5,000 Units Subcutaneous Q12H  . isosorbide mononitrate  30 mg Oral Daily  . levothyroxine  25 mcg Oral Q0600  . lubiprostone  24 mcg Oral BID WC  . multivitamin  1 tablet Oral QPM  . olopatadine  1 drop Both Eyes Daily  . sodium chloride flush  3 mL Intravenous Q12H  . sucroferric oxyhydroxide  500 mg Oral TID   Continuous Infusions: . sodium chloride     PRN Meds: sodium  chloride, acetaminophen,  azelastine, hyoscyamine, lactulose, mupirocin ointment, ondansetron, sodium chloride flush  Allergies:    Allergies  Allergen Reactions  . Ace Inhibitors Cough and Other (See Comments)  . Lisinopril Cough    Social History:   Social History   Socioeconomic History  . Marital status: Single    Spouse name: Not on file  . Number of children: Not on file  . Years of education: Not on file  . Highest education level: Not on file  Occupational History  . Not on file  Tobacco Use  . Smoking status: Former Smoker    Types: Cigarettes    Quit date: 2000    Years since quitting: 21.8  . Smokeless tobacco: Never Used  Vaping Use  . Vaping Use: Never used  Substance and Sexual Activity  . Alcohol use: Yes    Comment: once a month   . Drug use: No  . Sexual activity: Not Currently    Partners: Male    Birth control/protection: Condom    Comment: declined  condoms 07/11/20  Other Topics Concern  . Not on file  Social History Narrative  . Not on file   Social Determinants of Health   Financial Resource Strain:   . Difficulty of Paying Living Expenses: Not on file  Food Insecurity: No Food Insecurity  . Worried About Charity fundraiser in the Last Year: Never true  . Ran Out of Food in the Last Year: Never true  Transportation Needs: No Transportation Needs  . Lack of Transportation (Medical): No  . Lack of Transportation (Non-Medical): No  Physical Activity:   . Days of Exercise per Week: Not on file  . Minutes of Exercise per Session: Not on file  Stress:   . Feeling of Stress : Not on file  Social Connections:   . Frequency of Communication with Friends and Family: Not on file  . Frequency of Social Gatherings with Friends and Family: Not on file  . Attends Religious Services: Not on file  . Active Member of Clubs or Organizations: Not on file  . Attends Archivist Meetings: Not on file  . Marital Status: Not on file  Intimate  Partner Violence:   . Fear of Current or Ex-Partner: Not on file  . Emotionally Abused: Not on file  . Physically Abused: Not on file  . Sexually Abused: Not on file    Family History:   Family History  Problem Relation Age of Onset  . Heart failure Mother   . Heart attack Maternal Grandmother 53  . Hypertension Sister   . Multiple sclerosis Sister   . Hypertension Brother   . Hypertension Sister   . Scoliosis Other   . Allergic rhinitis Neg Hx   . Angioedema Neg Hx   . Asthma Neg Hx   . Eczema Neg Hx   . Immunodeficiency Neg Hx   . Urticaria Neg Hx    Family Status:  Family Status  Relation Name Status  . Mother  Alive  . MGM  Deceased  . Sister  Alive  . Brother  Alive  . Father  Alive  . Sister  Alive  . Other nephew Alive  . MGF  Deceased  . PGM  Deceased  . PGF  Deceased  . Neg Hx  (Not Specified)    ROS:  Please see the history of present illness.  All other ROS reviewed and negative.     Physical Exam/Data:   Vitals:   09/04/20 1200  09/04/20 1230 09/04/20 1245 09/04/20 1332  BP: 135/87 136/80 (!) 143/90 127/86  Pulse: 72 78 79 84  Resp:  16 16 16   Temp:   98.2 F (36.8 C)   TempSrc:  Oral Oral   SpO2:  99% 99% 98%  Weight:   85.2 kg   Height:        Intake/Output Summary (Last 24 hours) at 09/04/2020 1425 Last data filed at 09/04/2020 1245 Gross per 24 hour  Intake 826 ml  Output 3000 ml  Net -2174 ml    Last 3 Weights 09/04/2020 09/04/2020 09/04/2020  Weight (lbs) 187 lb 13.3 oz 194 lb 3.6 oz 192 lb 6.4 oz  Weight (kg) 85.2 kg 88.1 kg 87.272 kg     Body mass index is 26.95 kg/m.   General:  Well nourished, well developed, male in no acute distress HEENT: normal Lymph: no adenopathy Neck: JVD - not elevated Endocrine:  No thryomegaly Vascular: No carotid bruits; 4/4 extremity pulses 2+  Cardiac:  normal S1, S2; RRR; soft murmur Lungs:  clear bilaterally, no wheezing, rhonchi or rales  Abd: soft, nontender, no hepatomegaly  Ext: no  edema Musculoskeletal:  No deformities, BUE and BLE strength normal and equal Skin: warm and dry  Neuro:  CNs 2-12 intact, no focal abnormalities noted Psych:  Normal affect   EKG:  The EKG was personally reviewed and demonstrates:  SR, HR 93, PVC, ?LVH, 398/494 ms Telemetry:  Telemetry was personally reviewed and demonstrates:  SR, PVCs   CV studies:   TEE: 09/03/2020 1. Left ventricular ejection fraction, by estimation, is 50 to 55%. The  left ventricle has normal function. The left ventricle has no regional  wall motion abnormalities.  2. Right ventricular systolic function is normal. The right ventricular  size is normal.  3. Left atrial size was mildly dilated..  4. MV annulus measures 37 mm. The mitral leaflets appear normal but  coaptation not complete There are multiple jets of Albert, the most prominent  is posteriorly directed, wrapping around posterior aspect of LA Appears  moderate to moderately severe  (Carpentier Type I). The mitral valve is normal in structure. Moderate to  severe mitral valve regurgitation.  5. The aortic valve is normal in structure. Aortic valve regurgitation is  mild.  6. Agitated saline contrast bubble study was negative, with no evidence  of any interatrial shunt.   ECHO: 08/31/2020 1. Left ventricular ejection fraction, by estimation, is 60 to 65%. The  left ventricle has normal function. The left ventricle has no regional  wall motion abnormalities. The left ventricular internal cavity size was  moderately dilated. There is mild left ventricular hypertrophy.  2. Right ventricular systolic function is normal. The right ventricular  size is normal. There is moderately elevated pulmonary artery systolic  pressure.  3. Left atrial size was severely dilated.  4. Right atrial size was mildly dilated.  5. Albert jet is eccentrically directed along posterior wall of LA Would  recomm TEE to further define mechanism. COmpared to echo from  APril 2021, Albert appears more severe.. The mitral valve is grossly normal. Severe mitral valve regurgitation.  6. The aortic valve is normal in structure. Aortic valve regurgitation is  trivial.  7. Aortic dilatation noted. There is mild dilatation of the aortic root,  measuring 40 mm.  8. The inferior vena cava is normal in size with greater than 50%  respiratory variability, suggesting right atrial pressure of 3 mmHg.   MYOVIEW: 01/31/2020  Nuclear  stress EF: 51%. The left ventricular ejection fraction is mildly decreased (45-54%). The LV appears to be mildly dilated.  There was no ST segment deviation noted during stress.  This is a low risk study. There is no evidence of ischemia or previous infarction.  The study is normal.   Laboratory Data:   Chemistry Recent Labs  Lab September 25, 2020 0442 09/25/20 1817 09/04/20 0359  NA 136 135 139  K 3.2* 2.9* 3.3*  CL 96* 96* 98  CO2 24 23 23   GLUCOSE 97 125* 103*  BUN 50* 57* 65*  CREATININE 8.71* 9.23* 9.74*  CALCIUM 10.1 9.6 9.5  GFRNONAA 7* 6* 6*  ANIONGAP 16* 16* 18*    Lab Results  Component Value Date   ALT 15 08/30/2020   AST 14 (L) 08/30/2020   ALKPHOS 52 08/30/2020   BILITOT 0.7 08/30/2020   Hematology Recent Labs  Lab 2020-09-25 0442 09/25/20 1817 09/04/20 0359  WBC 7.1 6.1 6.0  RBC 2.78* 2.78* 2.71*  HGB 9.6* 9.6* 9.2*  HCT 27.2* 26.8* 26.5*  MCV 97.8 96.4 97.8  MCH 34.5* 34.5* 33.9  MCHC 35.3 35.8 34.7  RDW 11.9 11.9 12.0  PLT 215 228 217   Cardiac Enzymes High Sensitivity Troponin:   Recent Labs  Lab 08/15/20 2247 08/16/20 0115 08/30/20 1113 08/31/20 0120 08/31/20 0631  TROPONINIHS 61* 65* 68* 69* 64*      BNP Recent Labs  Lab 08/30/20 1113  BNP 1,331.1*    TSH:  Lab Results  Component Value Date   TSH 2.290 05/01/2020   Lipids: Lab Results  Component Value Date   CHOL 180 06/27/2020   HDL 37 (L) 06/27/2020   LDLCALC 113 (H) 06/27/2020   TRIG 183 (H) 06/27/2020   CHOLHDL 4.9  06/27/2020   HgbA1c: Lab Results  Component Value Date   HGBA1C 5.3 05/01/2020   HGBA1C 5.3 05/01/2020   HGBA1C 5.3 (A) 05/01/2020   HGBA1C 5.3 05/01/2020   Magnesium:  Magnesium  Date Value Ref Range Status  08/30/2020 1.8 1.7 - 2.4 mg/dL Final    Comment:    Performed at [facility], [place] [address], [place], [place] [address]     Radiology/Studies:  ECHO TEE  Result Date: 09/25/20    TRANSESOPHOGEAL ECHO REPORT   Patient Name:   TURHAN CHILL Eaton Date of Exam: 09/25/2020 Medical Rec #:  [id number]       Height:       70.0 in Accession #:    [id number]      Weight:       190.2 lb Date of Birth:  Apr 04, 1966        BSA:          2.043 m Patient Age:    21 years        BP:           154/73 mmHg Patient Gender: M               HR:           84 bpm. Exam Location:  Inpatient Procedure: Transesophageal Echo, 3D Echo, Limited Color Doppler, Cardiac Doppler            and Saline Contrast Bubble Study Indications:     Mitral Regurgitation  History:         Patient has prior history of Echocardiogram examinations, most                  recent 08/31/2020. CHF,  Signs/Symptoms:Murmur; Risk                  Factors:Hypertension, Dyslipidemia and Non-Smoker. GERD.  Sonographer:     Vickie Epley RDCS Referring Phys:  [id number] Darreld Mclean Diagnosing Phys: [name] MD PROCEDURE: The transesophogeal probe was passed without difficulty through the esophogus of the patient. Local oropharyngeal anesthetic was provided with Cetacaine. Sedation performed by different physician. The patient was monitored while under deep sedation. Anesthestetic sedation was provided intravenously by Anesthesiology: 300.62m of Propofol. The patient developed no complications during the procedure. IMPRESSIONS  1. Left ventricular ejection fraction, by estimation, is 50 to 55%. The left ventricle has normal function. The left ventricle has no regional wall motion abnormalities.  2. Right ventricular systolic function is normal.  The right ventricular size is normal.  3. Left atrial size was mildly dilated..  4. MV annulus measures 37 mm. The mitral leaflets appear normal but coaptation not complete There are multiple jets of Albert, the most prominent is posteriorly directed, wrapping around posterior aspect of LA Appears moderate to moderately severe (Carpentier Type I). The mitral valve is normal in structure. Moderate to severe mitral valve regurgitation.  5. The aortic valve is normal in structure. Aortic valve regurgitation is mild.  6. Agitated saline contrast bubble study was negative, with no evidence of any interatrial shunt. FINDINGS  Left Ventricle: Left ventricular ejection fraction, by estimation, is 50 to 55%. The left ventricle has normal function. The left ventricle has no regional wall motion abnormalities. The left ventricular internal cavity size was normal in size. Right Ventricle: The right ventricular size is normal. Right vetricular wall thickness was not assessed. Right ventricular systolic function is normal. Left Atrium: Left atrial size was mildly dilated. No left atrial/left atrial appendage thrombus was detected. Right Atrium: Right atrial size was normal in size. Pericardium: There is no evidence of pericardial effusion. Mitral Valve: MV annulus measures 37 mm. The mitral leaflets appear normal but coaptation not complete There are multiple jets of Albert, the most prominent is posteriorly directed, wrapping around posterior aspect of LA Appears moderate to moderately severe  (Carpentier Type I). The mitral valve is normal in structure. Moderate to severe mitral valve regurgitation. MV peak gradient, 1.0 mmHg. The mean mitral valve gradient is 1.0 mmHg. Tricuspid Valve: The tricuspid valve is normal in structure. Tricuspid valve regurgitation is trivial. Aortic Valve: The aortic valve is normal in structure. Aortic valve regurgitation is mild. Pulmonic Valve: The pulmonic valve was normal in structure. Pulmonic valve  regurgitation is not visualized. Aorta: The aortic root and ascending aorta are structurally normal, with no evidence of dilitation. IAS/Shunts: No atrial level shunt detected by color flow Doppler. Agitated saline contrast was given intravenously to evaluate for intracardiac shunting. Agitated saline contrast bubble study was negative, with no evidence of any interatrial shunt.  MITRAL VALVE MV Peak grad: 1.0 mmHg MV Mean grad: 1.0 mmHg MV Vmax:      0.50 m/s MV Vmean:     37.0 cm/s Albert Peak grad: 113.2 mmHg Albert Mean grad: 70.0 mmHg Albert Vmax:      532.00 cm/s Albert Vmean:     385.0 cm/s PDorris CarnesMD Electronically signed by PDorris CarnesMD Signature Date/Time: 09/03/2020/5:42:59 PM    Final     Assessment and Plan:   1. Acute on chronic Diastolic CHF - see echo, TEE, MV results above - volume mgt w/ HD - encourage good BP control  2. Mod-severe Albert -  see echo, TEE reports above - no loud murmur, no sig sx from this - episodes of volume overload have other causes, no sig CP, no palpitations, no presyncope - f/u with Dr [name], further eval per him.   Otherwise, per IM, ok for d/c once Dr Claiborne Billings sees.  Principal Problem:   Acute diastolic CHF (congestive heart failure) (HCC) Active Problems:   HIV disease ([place])   Hypertensive heart disease with CHF (congestive heart failure) (HCC)   Anemia in other chronic diseases classified elsewhere   Dependence on renal dialysis ([place])   End stage renal disease ([place])     For questions or updates, please contact [facility] Please consult [email or web address] for contact info under Cardiology/STEMI.   Signed, Rosaria Ferries, PA-C  09/04/2020 2:25 PM    Patient seen and examined. Agree with assessment and plan.  Albert Eaton is an a 54 year old African-American male who has a history of end-stage renal disease on home hemodialysis, Crohn's disease, HIV on therapy, who is status post a recent episode of pneumonia and was admitted with increasing shortness of  breath.  In the past, the patient had seen Dr. [name] and last saw him in [place] 2020.  An echo Doppler study in 2019 showed an EF of 60 to 65% without regional wall motion abnormalities.  There was grade 2 diastolic dysfunction.  He had mild dilatation of his ascending aorta at 40 mm.  At that time there was mild to moderate mitral regurgitation directed eccentrically and posteriorly.  It was reported that his left atrium was severely dilated.  During the patient's current hospitalization he underwent a repeat echo Doppler study which now suggested more severe mitral regurgitation.  He again was noted to have severe left atrial dilatation and mild right atrial dilatation.  EF was 60 to 65%.  There was evidence for mild LVH and his aortic root remained mildly dilated at 40 mm.  He subsequently underwent a transesophageal echo cardiographic evaluation done yesterday by Dr. [name] and her report EF estimate was only 50 to 55% and the left atrium was only mildly dilated.  However there were multiple mitral regurgitant jets the most prominent is posteriorly directed wrapping around the posterior aspect of the LA and appears to be at least moderately severe.  We were asked to evaluate the patient prior to him going home today.  Presently he feels well.  He is not having chest pain and appears euvolemic.  Rhythm is stable in sinus rhythm and had previous PVCs detected.  There is no JVD.  His lungs are clear.  Rhythm is regular with a 2/6 Albert murmur.  Abdomen is nontender.  There is no edema.  ECG shows sinus rhythm at 93 bpm with isolated PVC.  QTc interval 494 ms.  Presently, patient is stable for discharge today.  I have recommended that he see Dr. [name] in follow-up.  If the TEE is accurate there is concern with decreased EF compared to his transthoracic echo.  He may ultimately need further assessment for possible minimally invasive mitral valve repair versus consideration for future mitral clip  procedure.   Troy Sine, MD, [facility] 09/04/2020 3:36 PM

## 2020-09-04 NOTE — TOC Transition Note (Addendum)
Transition of Care [facility]) - CM/SW Discharge Note   Patient Details  Name: Albert Eaton MRN: [id number] Date of Birth: Sep 09, 1966  [facility] CM/SW Contact:  Zenon Mayo, RN Phone Number: 09/04/2020, 9:31 AM   Clinical Narrative:    NCM spoke with patient at bedside, asked if he wanted a [facility] for CHF disease management, he states yes, NCM offered choice, he states he does not have a preference.  NCM made referral to [facility] with [facility].  Awaiting call back to see if Alvis Lemmings can take referral.  Patient will not need HHRN per MD.  NCM informed patient , he states ok.  He ambulates good so he will not need HHPT as well, so no HH services was set up for him.   Final next level of care: [place] Barriers to Discharge: No Barriers Identified   Patient Goals and CMS Choice Patient states their goals for this hospitalization and ongoing recovery are:: get a kidney so he can go back to work, [employer].gov Compare Post Acute Care list provided to:: Patient Choice offered to / list presented to : Patient  Discharge Placement                       Discharge Plan and Services In-house Referral: NA Discharge Planning Services: CM Consult Post Acute Care Choice: Home Health            DME Agency: NA       HH Arranged: RN, Disease Management [place] Agency: [place] Date [facility] Agency Contacted: 09/04/20 Time [place]: [phone] Representative spoke with at [place]: [place] Determinants of Health (SDOH) Interventions Food Insecurity Interventions: Intervention Not Indicated Transportation Interventions: Intervention Not Indicated   Readmission Risk Interventions Readmission Risk Prevention Plan 09/04/2020  Transportation Screening Complete  Medication Review Press photographer) Complete  PCP or Specialist appointment within 3-5 days of discharge Complete  HRI or [place] Complete  SW Recovery  Care/Counseling Consult Complete  [place] Not Applicable  Some recent data might be hidden

## 2020-09-04 NOTE — Discharge Summary (Addendum)
Physician Discharge Summary  GILDARDO TICKNER GMW:[id number] DOB: 08/08/1966 DOA: 08/30/2020  PCP: [name], FNP  Admit date: 08/30/2020 Discharge date: 09/04/2020  Admitted From: Home Disposition:  Home  Recommendations for Outpatient Follow-up:  1. Follow up with Cards in 1-2 weeks 2. Please obtain BMP/CBC in one week   Home Health:No Equipment/Devices:none  Discharge Condition:Stable CODE STATUS:Full Diet recommendation: Heart Healthy   Brief/Interim Summary: 54 y.o. male past medical history of end-stage renal disease Monday Tuesday Fridays, refractory hypertension, chronic combined diastolic heart failure with a last EF on 02/14/2020 that showed an EF of 44% with diastolic dysfunction grade 2, HIV on Hart therapy comes in for shortness of breath that started about 2 weeks prior to admission with a dry cough and a fever supposedly sent from dialysis center to the ED chest x-ray was done that showed significant fluid overloaded hypertensive 1300   Discharge Diagnoses:  Principal Problem:   Acute diastolic CHF (congestive heart failure) ([place]) Active Problems:   HIV disease ([place])   Hypertensive heart disease with CHF (congestive heart failure) ([place])   Anemia in other chronic diseases classified elsewhere   Dependence on renal dialysis ([place])   End stage renal disease ([place])  Acute respiratory failure with hypoxia probably due to noncardiogenic pulmonary edema: Question pulmonary edema versus pneumonia he had a mild fever he was started on antibiotic he completed his course of IV antibiotics in house. Nephrology was consulted and he was continued on his regular dialysis his respiration improved 2D echo was done which showed some structural abnormalities of the mitral valve TEE was done that showed a mitral valve annulus of 37 mm and there are multiple jets most prominently directly posteriorly, cardiology was consulted for his moderate to severe mitral regurgitation and  recommended outpatient follow-up.  Uncontrolled hypertension/hypertensive heart disease with diastolic heart failure: He was continued on Coreg hydralazine and Norvasc regimen will follow up with cardiology as an outpatient.  Hypokalemia: Corrected with HD.  HIV: No change made to his medication.  Anemia of chronic disease renal: Continue further management per renal.  End-stage renal disease: We will follow up with renal as an outpatient usually dialyzes Monday Wednesday and Fridays. Discharge Instructions  Discharge Instructions    Diet - low sodium heart healthy   Complete by: As directed    Increase activity slowly   Complete by: As directed    No wound care   Complete by: As directed      Allergies as of 09/04/2020      Reactions   Ace Inhibitors Cough, Other (See Comments)   Lisinopril Cough      Medication List    TAKE these medications   abacavir 300 MG tablet Commonly known as: ZIAGEN Take 1 tablet (300 mg total) by mouth daily.   albuterol 108 (90 Base) MCG/ACT inhaler Commonly known as: VENTOLIN HFA INHALE 2 PUFFS INTO THE LUNGS EVERY 4 HOURS AS NEEDED FOR WHEEZING OR SHORTNESS OF BREATH What changed: See the new instructions.   allopurinol 100 MG tablet Commonly known as: ZYLOPRIM Take 1 tablet (100 mg total) by mouth 2 (two) times daily.   amLODipine 10 MG tablet Commonly known as: NORVASC Take 1 tablet (10 mg total) by mouth daily.   azelastine 0.1 % nasal spray Commonly known as: ASTELIN Place 2 sprays into both nostrils 2 (two) times daily as needed for rhinitis. Use in each nostril as directed What changed: additional instructions   bictegravir-emtricitabine-tenofovir AF 50-200-25 MG Tabs tablet  Commonly known as: BIKTARVY Take 1 tablet by mouth daily.   calcitRIOL 0.5 MCG capsule Commonly known as: ROCALTROL Take 0.5 mcg by mouth daily.   carvedilol 25 MG tablet Commonly known as: COREG TAKE 1 TABLET BY MOUTH TWICE DAILY WITH FOOD    Dialyvite/Zinc Tabs Take 1 tablet by mouth every evening.   famotidine 20 MG tablet Commonly known as: PEPCID TAKE 1 TABLET(20 MG) BY MOUTH TWICE DAILY What changed: See the new instructions.   Flovent HFA 110 MCG/ACT inhaler Generic drug: fluticasone Inhale 2 puffs into the lungs 2 (two) times daily.   gabapentin 300 MG capsule Commonly known as: NEURONTIN TAKE 1 CAPSULE(300 MG) BY MOUTH THREE TIMES DAILY What changed:   how much to take  how to take this  when to take this   hydrALAZINE 100 MG tablet Commonly known as: APRESOLINE TAKE 1 TABLET BY MOUTH THREE TIMES DAILY   hyoscyamine 0.125 MG SL tablet Commonly known as: LEVSIN SL Place 1-2 tablets (0.125-0.25 mg total) under the tongue every 8 (eight) hours as needed (lower abdominal pain, spasm). What changed: reasons to take this   ipratropium 0.06 % nasal spray Commonly known as: ATROVENT USE 2 SPRAYS IN EACH NOSTRIL 2 TO 3 TIMES DAILY AS NEEDED What changed:   how much to take  how to take this  when to take this  reasons to take this  additional instructions   Isentress 400 MG tablet Generic drug: raltegravir Take 1 tablet (400 mg total) by mouth 2 (two) times daily.   lactulose 10 GM/15ML solution Commonly known as: CHRONULAC Take 20 g by mouth daily as needed for moderate constipation.   lamivudine 100 MG tablet Commonly known as: EPIVIR TAKE 1 TABLET(100 MG) BY MOUTH DAILY What changed:   how much to take  how to take this  when to take this  additional instructions   levothyroxine 25 MCG tablet Commonly known as: SYNTHROID Take 1 tablet by mouth daily before breakfast What changed:   how much to take  how to take this  when to take this   lidocaine-prilocaine cream Commonly known as: EMLA Apply 1 application topically See admin instructions. Apply to access site 1-2 hours before dialysis   lubiprostone 24 MCG capsule Commonly known as: AMITIZA TAKE 1 CAPSULE(24 MCG) BY  MOUTH TWICE DAILY WITH A MEAL What changed: See the new instructions.   mupirocin ointment 2 % Commonly known as: BACTROBAN Apply 1 application topically daily as needed (apply to port after dialysis).   Pazeo 0.7 % Soln Generic drug: Olopatadine HCl Place 1 drop into both eyes daily.   Spacer/Aero-Holding Dorise Bullion 1 Device by Does not apply route 2 (two) times a day.   traMADol 50 MG tablet Commonly known as: ULTRAM Take 1 tablet (50 mg total) by mouth every 6 (six) hours as needed for moderate pain.   Turmeric 500 MG Caps Take 500 mg by mouth daily.   Velphoro 500 MG chewable tablet Generic drug: sucroferric oxyhydroxide Chew 500 mg by mouth 3 (three) times daily.   vitamin C with rose hips 1000 MG tablet Take 1,000 mg by mouth daily.   Vitamin D (Ergocalciferol) 1.25 MG (50000 UNIT) Caps capsule Commonly known as: DRISDOL TAKE 1 CAPSULE BY MOUTH EVERY 7 DAYS What changed:   how much to take  how to take this  when to take this  additional instructions   vitamin E 180 MG (400 UNITS) capsule Generic drug: vitamin E Take 400  Units by mouth daily.   zinc gluconate 50 MG tablet Take 50 mg by mouth at bedtime.       Allergies  Allergen Reactions  . Ace Inhibitors Cough and Other (See Comments)  . Lisinopril Cough    Consultations:  Cardiology   Procedures/Studies: CT Abdomen Pelvis Wo Contrast  Result Date: 08/14/2020 CLINICAL DATA:  55 year old male with lower abdominal pain with diarrhea for 1 month. On dialysis. HIV, hepatitis B. EXAM: CT ABDOMEN AND PELVIS WITHOUT CONTRAST TECHNIQUE: Multidetector CT imaging of the abdomen and pelvis was performed following the standard protocol without IV contrast. Oral contrast was administered. COMPARISON:  CT Abdomen and Pelvis 07/11/2020 and earlier. FINDINGS: Lower chest: Stable mild cardiomegaly. No pericardial or pleural effusion. Mild lung base atelectasis. Hepatobiliary: Absent gallbladder as before.  Negative noncontrast liver. Pancreas: Negative. Spleen: Negative. Adrenals/Urinary Tract: Normal adrenal glands. Stable kidneys. No hydronephrosis. No hydroureter. Diminutive and unremarkable urinary bladder. Chronic pelvic phleboliths. Stomach/Bowel: Oral contrast administered and has reached the sigmoid colon without evidence of obstruction. Mildly redundant sigmoid. No large bowel inflammation. Redundant right colon. Normal appendix (series 3, image 49). No dilated or abnormal small bowel loops identified. Unremarkable stomach, duodenum. No free air, free fluid, mesenteric stranding. Vascular/Lymphatic: Vascular patency is not evaluated in the absence of IV contrast. Calcified aortic atherosclerosis. Stable mild aortoiliac tortuosity. No lymphadenopathy. Reproductive: Stable small fat containing inguinal hernias. Other: No pelvic free fluid. Musculoskeletal: No acute osseous abnormality identified. IMPRESSION: 1. No acute or inflammatory process identified in the abdomen or pelvis. Normal appendix. 2. Stable mild cardiomegaly.  Aortic Atherosclerosis (ICD10-I70.0). Electronically Signed   By: Genevie Ann M.D.   On: 08/14/2020 01:18   DG Chest 2 View  Result Date: 08/30/2020 CLINICAL DATA:  Chest pain.  Shortness of breath.  Cough and fever. EXAM: CHEST - 2 VIEW COMPARISON:  08/15/2020. FINDINGS: Left basilar opacities with apparent "spine sign" on the lateral radiograph. Vascular congestion without overt pulmonary edema. No visible pleural effusions or pneumothorax. Enlarged cardiac silhouette. IMPRESSION: 1. Left basilar opacities, which may represent atelectasis, aspiration, and/or pneumonia. 2. Vascular congestion without overt pulmonary edema. 3. Cardiomegaly. Electronically Signed   By: Margaretha Sheffield MD   On: 08/30/2020 11:43   CT CHEST WO CONTRAST  Result Date: 08/30/2020 CLINICAL DATA:  Pneumonia, unresolved Worsening chest pain, shortness of breath and cough. EXAM: CT CHEST WITHOUT CONTRAST  TECHNIQUE: Multidetector CT imaging of the chest was performed following the standard protocol without IV contrast. COMPARISON:  Chest radiograph earlier today FINDINGS: Cardiovascular: Cardiomegaly with coronary artery calcifications. The thoracic aorta is normal in caliber. No pericardial effusion. Mediastinum/Nodes: Small mediastinal lymph nodes are not enlarged by size criteria. Limited assessment for hilar adenopathy in the absence of IV contrast. Tiny hiatal hernia. No esophageal wall thickening. No suspicious thyroid nodule. Lungs/Pleura: There are perihilar ground-glass opacities, relatively symmetric. Areas of smooth septal thickening. There is mild central bronchial thickening. Overall findings favor pulmonary edema. Possibility of atypical infection is also considered. No confluent consolidation. Mild apical emphysema. No pulmonary mass. No significant pleural effusion. Upper Abdomen: Cholecystectomy.  No acute findings. Musculoskeletal: There are no acute or suspicious osseous abnormalities. IMPRESSION: 1. Cardiomegaly. Perihilar ground-glass opacities and smooth septal thickening and mild bronchial thickening. Overall findings favor pulmonary edema. Possibility of bronchitis and atypical infection is also considered, but felt less likely. 2. Coronary artery calcifications. Emphysema (ICD10-J43.9). Electronically Signed   By: Keith Rake M.D.   On: 08/30/2020 19:29   DG Chest Portable 1 View  Result Date: 08/15/2020 CLINICAL DATA:  Chest pain for several days EXAM: PORTABLE CHEST 1 VIEW COMPARISON:  11/22/2019 FINDINGS: Cardiac shadow is within normal limits. Increased vascular congestion is noted without interstitial edema. No focal infiltrate or effusion is noted. No bony abnormality is seen. IMPRESSION: Mild vascular congestion without interstitial edema. Electronically Signed   By: Inez Catalina M.D.   On: 08/15/2020 23:30   ECHOCARDIOGRAM COMPLETE  Result Date: 08/31/2020     ECHOCARDIOGRAM REPORT   Patient Name:   TAJH LIVSEY Stallone Date of Exam: 08/31/2020 Medical Rec #:  [id number]       Height:       70.0 in Accession #:    [id number]      Weight:       198.2 lb Date of Birth:  [birth year]        BSA:          2.079 m Patient Age:    89 years        BP:           155/89 mmHg Patient Gender: M               HR:           96 bpm. Exam Location:  Inpatient Procedure: 2D Echo, Cardiac Doppler and Color Doppler Indications:    I50.23 Acute on chronic systolic (congestive) heart failure  History:        Patient has prior history of Echocardiogram examinations, most                 recent 01/31/2020. Risk Factors:Hypertension, Dyslipidemia and                 Sleep Apnea. CKD.  Sonographer:    Jonelle Sidle Dance Referring Phys: [id number] [place]  1. Left ventricular ejection fraction, by estimation, is 60 to 65%. The left ventricle has normal function. The left ventricle has no regional wall motion abnormalities. The left ventricular internal cavity size was moderately dilated. There is mild left ventricular hypertrophy.  2. Right ventricular systolic function is normal. The right ventricular size is normal. There is moderately elevated pulmonary artery systolic pressure.  3. Left atrial size was severely dilated.  4. Right atrial size was mildly dilated.  5. MR jet is eccentrically directed along posterior wall of LA Would recomm TEE to further define mechanism. COmpared to echo from APril 2021, MR appears more severe.. The mitral valve is grossly normal. Severe mitral valve regurgitation.  6. The aortic valve is normal in structure. Aortic valve regurgitation is trivial.  7. Aortic dilatation noted. There is mild dilatation of the aortic root, measuring 40 mm.  8. The inferior vena cava is normal in size with greater than 50% respiratory variability, suggesting right atrial pressure of 3 mmHg. FINDINGS  Left Ventricle: Left ventricular ejection fraction, by estimation, is 60 to 65%. The  left ventricle has normal function. The left ventricle has no regional wall motion abnormalities. The left ventricular internal cavity size was moderately dilated. There is mild left ventricular hypertrophy. Left ventricular diastolic function could not be evaluated. Right Ventricle: The right ventricular size is normal. Right vetricular wall thickness was not assessed. Right ventricular systolic function is normal. There is moderately elevated pulmonary artery systolic pressure. The tricuspid regurgitant velocity is  3.11 m/s, and with an assumed right atrial pressure of 8 mmHg, the estimated right ventricular systolic pressure is 21.1 mmHg. Left Atrium: Left atrial size was severely dilated.  Right Atrium: Right atrial size was mildly dilated. Pericardium: There is no evidence of pericardial effusion. Mitral Valve: MR jet is eccentrically directed along posterior wall of LA Would recomm TEE to further define mechanism. COmpared to echo from APril 2021, MR appears more severe. The mitral valve is grossly normal. Severe mitral valve regurgitation. Tricuspid Valve: The tricuspid valve is normal in structure. Tricuspid valve regurgitation is mild. Aortic Valve: The aortic valve is normal in structure. Aortic valve regurgitation is trivial. Pulmonic Valve: The pulmonic valve was normal in structure. Pulmonic valve regurgitation is mild. Aorta: Aortic dilatation noted. There is mild dilatation of the aortic root, measuring 40 mm. Venous: The inferior vena cava is normal in size with greater than 50% respiratory variability, suggesting right atrial pressure of 3 mmHg. IAS/Shunts: No atrial level shunt detected by color flow Doppler.  LEFT VENTRICLE PLAX 2D LVIDd:         5.88 cm LVIDs:         3.83 cm LV PW:         1.25 cm LV IVS:        1.28 cm LVOT diam:     2.30 cm LV SV:         94 LV SV Index:   45 LVOT Area:     4.15 cm  RIGHT VENTRICLE             IVC RV Basal diam:  3.82 cm     IVC diam: 3.00 cm RV Mid diam:     2.58 cm RV S prime:     19.60 cm/s TAPSE (M-mode): 2.6 cm LEFT ATRIUM              Index       RIGHT ATRIUM           Index LA diam:        5.60 cm  2.69 cm/m  RA Area:     27.50 cm LA Vol (A2C):   137.0 ml 65.89 ml/m RA Volume:   91.50 ml  44.01 ml/m LA Vol (A4C):   116.0 ml 55.79 ml/m LA Biplane Vol: 127.0 ml 61.08 ml/m  AORTIC VALVE LVOT Vmax:   130.00 cm/s LVOT Vmean:  88.750 cm/s LVOT VTI:    0.226 m  AORTA Ao Root diam: 4.00 cm Ao Asc diam:  3.90 cm MITRAL VALVE                 TRICUSPID VALVE MV Area (PHT): 4.21 cm      TR Peak grad:   38.7 mmHg MV Decel Time: 180 msec      TR Vmax:        311.00 cm/s MR Peak grad:    140.7 mmHg MR Mean grad:    98.5 mmHg   SHUNTS MR Vmax:         593.00 cm/s Systemic VTI:  0.23 m MR Vmean:        467.5 cm/s  Systemic Diam: 2.30 cm MR PISA:         1.57 cm MR PISA Eff ROA: 11 mm MR PISA Radius:  0.50 cm MV E velocity: 154.00 cm/s MV A velocity: 65.00 cm/s MV E/A ratio:  2.37 [name] MD Electronically signed by [name] MD Signature Date/Time: 08/31/2020/4:09:59 PM    Final    ECHO TEE  Result Date: 09/03/2020    TRANSESOPHOGEAL ECHO REPORT   Patient Name:   DEQUAVION FOLLETTE Aune Date of Exam: 09/03/2020 Medical  Rec #:  [id number]       Height:       70.0 in Accession #:    [id number]      Weight:       190.2 lb Date of Birth:  1966/04/29        BSA:          2.043 m Patient Age:    54 years        BP:           154/73 mmHg Patient Gender: M               HR:           84 bpm. Exam Location:  Inpatient Procedure: Transesophageal Echo, 3D Echo, Limited Color Doppler, Cardiac Doppler            and Saline Contrast Bubble Study Indications:     Mitral Regurgitation  History:         Patient has prior history of Echocardiogram examinations, most                  recent 08/31/2020. CHF, Signs/Symptoms:Murmur; Risk                  Factors:Hypertension, Dyslipidemia and Non-Smoker. GERD.  Sonographer:     Vickie Epley RDCS Referring Phys:  [id number] Darreld Mclean Diagnosing  Phys: [name] MD PROCEDURE: The transesophogeal probe was passed without difficulty through the esophogus of the patient. Local oropharyngeal anesthetic was provided with Cetacaine. Sedation performed by different physician. The patient was monitored while under deep sedation. Anesthestetic sedation was provided intravenously by Anesthesiology: 300.7m of Propofol. The patient developed no complications during the procedure. IMPRESSIONS  1. Left ventricular ejection fraction, by estimation, is 50 to 55%. The left ventricle has normal function. The left ventricle has no regional wall motion abnormalities.  2. Right ventricular systolic function is normal. The right ventricular size is normal.  3. Left atrial size was mildly dilated..  4. MV annulus measures 37 mm. The mitral leaflets appear normal but coaptation not complete There are multiple jets of MR, the most prominent is posteriorly directed, wrapping around posterior aspect of LA Appears moderate to moderately severe (Carpentier Type I). The mitral valve is normal in structure. Moderate to severe mitral valve regurgitation.  5. The aortic valve is normal in structure. Aortic valve regurgitation is mild.  6. Agitated saline contrast bubble study was negative, with no evidence of any interatrial shunt. FINDINGS  Left Ventricle: Left ventricular ejection fraction, by estimation, is 50 to 55%. The left ventricle has normal function. The left ventricle has no regional wall motion abnormalities. The left ventricular internal cavity size was normal in size. Right Ventricle: The right ventricular size is normal. Right vetricular wall thickness was not assessed. Right ventricular systolic function is normal. Left Atrium: Left atrial size was mildly dilated. No left atrial/left atrial appendage thrombus was detected. Right Atrium: Right atrial size was normal in size. Pericardium: There is no evidence of pericardial effusion. Mitral Valve: MV annulus measures 37 mm.  The mitral leaflets appear normal but coaptation not complete There are multiple jets of MR, the most prominent is posteriorly directed, wrapping around posterior aspect of LA Appears moderate to moderately severe  (Carpentier Type I). The mitral valve is normal in structure. Moderate to severe mitral valve regurgitation. MV peak gradient, 1.0 mmHg. The mean mitral valve gradient is 1.0 mmHg. Tricuspid Valve: The tricuspid valve is normal in structure.  Tricuspid valve regurgitation is trivial. Aortic Valve: The aortic valve is normal in structure. Aortic valve regurgitation is mild. Pulmonic Valve: The pulmonic valve was normal in structure. Pulmonic valve regurgitation is not visualized. Aorta: The aortic root and ascending aorta are structurally normal, with no evidence of dilitation. IAS/Shunts: No atrial level shunt detected by color flow Doppler. Agitated saline contrast was given intravenously to evaluate for intracardiac shunting. Agitated saline contrast bubble study was negative, with no evidence of any interatrial shunt.  MITRAL VALVE MV Peak grad: 1.0 mmHg MV Mean grad: 1.0 mmHg MV Vmax:      0.50 m/s MV Vmean:     37.0 cm/s MR Peak grad: 113.2 mmHg MR Mean grad: 70.0 mmHg MR Vmax:      532.00 cm/s MR Vmean:     385.0 cm/s [name] MD Electronically signed by [name] MD Signature Date/Time: 09/03/2020/5:42:59 PM    Final     (Echo, Carotid, EGD, Colonoscopy, ERCP)    Subjective: No new complaints feels great  Discharge Exam: Vitals:   09/04/20 0422 09/04/20 0806  BP: 111/81   Pulse: 70   Resp: 18   Temp: 98.1 F (36.7 C)   SpO2: 96% 98%   Vitals:   09/03/20 2103 09/03/20 2326 09/04/20 0422 09/04/20 0806  BP: 116/83  111/81   Pulse: 79 80 70   Resp: 20 20 18    Temp: 98.9 F (37.2 C)  98.1 F (36.7 C)   TempSrc: Oral  Oral   SpO2: 97% 96% 96% 98%  Weight:   87.3 kg   Height:        General: Pt is alert, awake, not in acute distress Cardiovascular: RRR, S1/S2 +, no rubs,  no gallops Respiratory: CTA bilaterally, no wheezing, no rhonchi Abdominal: Soft, NT, ND, bowel sounds + Extremities: no edema, no cyanosis    The results of significant diagnostics from this hospitalization (including imaging, microbiology, ancillary and laboratory) are listed below for reference.     Microbiology: Recent Results (from the past 240 hour(s))  Respiratory Panel by RT PCR (Flu A&B, Covid) - Nasopharyngeal Swab     Status: None   Collection Time: 08/30/20  2:02 PM   Specimen: Nasopharyngeal Swab  Result Value Ref Range Status   SARS Coronavirus 2 by RT PCR NEGATIVE NEGATIVE Final    Comment: (NOTE) SARS-CoV-2 target nucleic acids are NOT DETECTED.  The SARS-CoV-2 RNA is generally detectable in upper respiratoy specimens during the acute phase of infection. The lowest concentration of SARS-CoV-2 viral copies this assay can detect is 131 copies/mL. A negative result does not preclude SARS-Cov-2 infection and should not be used as the sole basis for treatment or other patient management decisions. A negative result may occur with  improper specimen collection/handling, submission of specimen other than nasopharyngeal swab, presence of viral mutation(s) within the areas targeted by this assay, and inadequate number of viral copies (<131 copies/mL). A negative result must be combined with clinical observations, patient history, and epidemiological information. The expected result is Negative.  Fact Sheet for Patients:  PinkCheek.be  Fact Sheet for Healthcare Providers:  GravelBags.it  This test is no t yet approved or cleared by the [place] FDA and  has been authorized for detection and/or diagnosis of SARS-CoV-2 by FDA under an Emergency Use Authorization (EUA). This EUA will remain  in effect (meaning this test can be used) for the duration of the COVID-19 declaration under Section 564(b)(1) of the  Act, 21 U.S.C. section  360bbb-3(b)(1), unless the authorization is terminated or revoked sooner.     Influenza A by PCR NEGATIVE NEGATIVE Final   Influenza B by PCR NEGATIVE NEGATIVE Final    Comment: (NOTE) The Xpert Xpress SARS-CoV-2/FLU/RSV assay is intended as an aid in  the diagnosis of influenza from Nasopharyngeal swab specimens and  should not be used as a sole basis for treatment. Nasal washings and  aspirates are unacceptable for Xpert Xpress SARS-CoV-2/FLU/RSV  testing.  Fact Sheet for Patients: PinkCheek.be  Fact Sheet for Healthcare Providers: GravelBags.it  This test is not yet approved or cleared by the [place] FDA and  has been authorized for detection and/or diagnosis of SARS-CoV-2 by  FDA under an Emergency Use Authorization (EUA). This EUA will remain  in effect (meaning this test can be used) for the duration of the  Covid-19 declaration under Section 564(b)(1) of the Act, 21  U.S.C. section 360bbb-3(b)(1), unless the authorization is  terminated or revoked. Performed at [facility], [place] [address] [place], [place] [address]   Culture, blood (routine x 2)     Status: None   Collection Time: 08/30/20  2:02 PM   Specimen: BLOOD RIGHT HAND  Result Value Ref Range Status   Specimen Description BLOOD RIGHT HAND  Final   Special Requests   Final    BOTTLES DRAWN AEROBIC AND ANAEROBIC Blood Culture adequate volume   Culture   Final    NO GROWTH 5 DAYS Performed at [facility], [place] [address] [place], [place] [address]    Report Status 09/04/2020 FINAL  Final  Culture, blood (routine x 2)     Status: None   Collection Time: 08/30/20  2:02 PM   Specimen: BLOOD RIGHT HAND  Result Value Ref Range Status   Specimen Description BLOOD RIGHT HAND  Final   Special Requests   Final    BOTTLES DRAWN AEROBIC AND ANAEROBIC Blood Culture results may not be optimal due to an inadequate volume  of blood received in culture bottles   Culture   Final    NO GROWTH 5 DAYS Performed at [facility], [place] [address], [place], [place] [address]    Report Status 09/04/2020 FINAL  Final  MRSA PCR Screening     Status: None   Collection Time: 08/31/20  1:28 AM   Specimen: Nasal Mucosa; Nasopharyngeal  Result Value Ref Range Status   MRSA by PCR NEGATIVE NEGATIVE Final    Comment:        The GeneXpert MRSA Assay (FDA approved for NASAL specimens only), is one component of a comprehensive MRSA colonization surveillance program. It is not intended to diagnose MRSA infection nor to guide or monitor treatment for MRSA infections. Performed at [facility], [place] [address] [place], [place] [address]      Labs: BNP (last 3 results) Recent Labs    08/30/20 1113  BNP 1,638.4*   Basic Metabolic Panel: Recent Labs  Lab 08/30/20 2028 08/30/20 2238 09/01/20 0422 09/02/20 0246 09/03/20 0442 09/03/20 1817 09/04/20 0359  NA  --    < > 138 134* 136 135 139  K  --    < > 3.3* 3.4* 3.2* 2.9* 3.3*  CL  --    < > 100 95* 96* 96* 98  CO2  --    < > 25 24 24 23 23   GLUCOSE  --    < > 99 94 97 125* 103*  BUN  --    < >  23* 24* 50* 57* 65*  CREATININE  --    < > 5.03* 5.84* 8.71* 9.23* 9.74*  CALCIUM  --    < > 9.5 9.5 10.1 9.6 9.5  MG 1.8  --   --   --   --   --   --   PHOS  --   --   --   --   --  5.0*  --    < > = values in this interval not displayed.   Liver Function Tests: Recent Labs  Lab 08/30/20 1402 09/03/20 1817  AST 14*  --   ALT 15  --   ALKPHOS 52  --   BILITOT 0.7  --   PROT 7.5  --   ALBUMIN 3.8 3.4*   No results for input(s): LIPASE, AMYLASE in the last 168 hours. No results for input(s): AMMONIA in the last 168 hours. CBC: Recent Labs  Lab 08/31/20 0631 09/02/20 1443 09/03/20 0442 09/03/20 1817 09/04/20 0359  WBC 7.3 7.3 7.1 6.1 6.0  NEUTROABS 4.7 4.1 3.3  --  3.2  HGB 8.5* 10.4* 9.6* 9.6* 9.2*  HCT 24.8* 28.9* 27.2* 26.8* 26.5*  MCV  102.1* 98.3 97.8 96.4 97.8  PLT 144* 209 215 228 217   Cardiac Enzymes: No results for input(s): CKTOTAL, CKMB, CKMBINDEX, TROPONINI in the last 168 hours. BNP: Invalid input(s): POCBNP CBG: No results for input(s): GLUCAP in the last 168 hours. D-Dimer No results for input(s): DDIMER in the last 72 hours. Hgb A1c No results for input(s): HGBA1C in the last 72 hours. Lipid Profile No results for input(s): CHOL, HDL, LDLCALC, TRIG, CHOLHDL, LDLDIRECT in the last 72 hours. Thyroid function studies No results for input(s): TSH, T4TOTAL, T3FREE, THYROIDAB in the last 72 hours.  Invalid input(s): FREET3 Anemia work up No results for input(s): VITAMINB12, FOLATE, FERRITIN, TIBC, IRON, RETICCTPCT in the last 72 hours. Urinalysis    Component Value Date/Time   COLORURINE YELLOW 08/14/2020 0408   APPEARANCEUR HAZY (A) 08/14/2020 0408   LABSPEC 1.013 08/14/2020 0408   PHURINE 5.0 08/14/2020 0408   GLUCOSEU NEGATIVE 08/14/2020 0408   HGBUR NEGATIVE 08/14/2020 0408   BILIRUBINUR NEGATIVE 08/14/2020 0408   BILIRUBINUR neg 12/27/2019 0913   KETONESUR NEGATIVE 08/14/2020 0408   PROTEINUR >=300 (A) 08/14/2020 0408   UROBILINOGEN 0.2 12/27/2019 0913   UROBILINOGEN 0.2 01/15/2018 1140   NITRITE NEGATIVE 08/14/2020 0408   LEUKOCYTESUR NEGATIVE 08/14/2020 0408   Sepsis Labs Invalid input(s): PROCALCITONIN,  WBC,  LACTICIDVEN Microbiology Recent Results (from the past 240 hour(s))  Respiratory Panel by RT PCR (Flu A&B, Covid) - Nasopharyngeal Swab     Status: None   Collection Time: 08/30/20  2:02 PM   Specimen: Nasopharyngeal Swab  Result Value Ref Range Status   SARS Coronavirus 2 by RT PCR NEGATIVE NEGATIVE Final    Comment: (NOTE) SARS-CoV-2 target nucleic acids are NOT DETECTED.  The SARS-CoV-2 RNA is generally detectable in upper respiratoy specimens during the acute phase of infection. The lowest concentration of SARS-CoV-2 viral copies this assay can detect is 131 copies/mL.  A negative result does not preclude SARS-Cov-2 infection and should not be used as the sole basis for treatment or other patient management decisions. A negative result may occur with  improper specimen collection/handling, submission of specimen other than nasopharyngeal swab, presence of viral mutation(s) within the areas targeted by this assay, and inadequate number of viral copies (<131 copies/mL). A negative result must be combined with clinical observations, patient  history, and epidemiological information. The expected result is Negative.  Fact Sheet for Patients:  PinkCheek.be  Fact Sheet for Healthcare Providers:  GravelBags.it  This test is no t yet approved or cleared by the [place] FDA and  has been authorized for detection and/or diagnosis of SARS-CoV-2 by FDA under an Emergency Use Authorization (EUA). This EUA will remain  in effect (meaning this test can be used) for the duration of the COVID-19 declaration under Section 564(b)(1) of the Act, 21 U.S.C. section 360bbb-3(b)(1), unless the authorization is terminated or revoked sooner.     Influenza A by PCR NEGATIVE NEGATIVE Final   Influenza B by PCR NEGATIVE NEGATIVE Final    Comment: (NOTE) The Xpert Xpress SARS-CoV-2/FLU/RSV assay is intended as an aid in  the diagnosis of influenza from Nasopharyngeal swab specimens and  should not be used as a sole basis for treatment. Nasal washings and  aspirates are unacceptable for Xpert Xpress SARS-CoV-2/FLU/RSV  testing.  Fact Sheet for Patients: PinkCheek.be  Fact Sheet for Healthcare Providers: GravelBags.it  This test is not yet approved or cleared by the [place] FDA and  has been authorized for detection and/or diagnosis of SARS-CoV-2 by  FDA under an Emergency Use Authorization (EUA). This EUA will remain  in effect (meaning this test  can be used) for the duration of the  Covid-19 declaration under Section 564(b)(1) of the Act, 21  U.S.C. section 360bbb-3(b)(1), unless the authorization is  terminated or revoked. Performed at [facility], [place] [address] [place], [place] [address]   Culture, blood (routine x 2)     Status: None   Collection Time: 08/30/20  2:02 PM   Specimen: BLOOD RIGHT HAND  Result Value Ref Range Status   Specimen Description BLOOD RIGHT HAND  Final   Special Requests   Final    BOTTLES DRAWN AEROBIC AND ANAEROBIC Blood Culture adequate volume   Culture   Final    NO GROWTH 5 DAYS Performed at [facility], [place] [address], [place], [place] [address]    Report Status 09/04/2020 FINAL  Final  Culture, blood (routine x 2)     Status: None   Collection Time: 08/30/20  2:02 PM   Specimen: BLOOD RIGHT HAND  Result Value Ref Range Status   Specimen Description BLOOD RIGHT HAND  Final   Special Requests   Final    BOTTLES DRAWN AEROBIC AND ANAEROBIC Blood Culture results may not be optimal due to an inadequate volume of blood received in culture bottles   Culture   Final    NO GROWTH 5 DAYS Performed at [facility], [place] [address] [place], [place] [address]    Report Status 09/04/2020 FINAL  Final  MRSA PCR Screening     Status: None   Collection Time: 08/31/20  1:28 AM   Specimen: Nasal Mucosa; Nasopharyngeal  Result Value Ref Range Status   MRSA by PCR NEGATIVE NEGATIVE Final    Comment:        The GeneXpert MRSA Assay (FDA approved for NASAL specimens only), is one component of a comprehensive MRSA colonization surveillance program. It is not intended to diagnose MRSA infection nor to guide or monitor treatment for MRSA infections. Performed at [facility], [place] [address] [place], [place] [address]      Time coordinating discharge: Over 30 minutes  SIGNED:   Charlynne Cousins, MD  [facility] 09/04/2020, 9:08 AM Pager   If  7PM-7AM,  please contact night-coverage [email or web address] Password [email or web address]

## 2020-09-04 NOTE — Progress Notes (Signed)
D/C instructions given and reviewed. No questions asked at this time but encouraged to call with any further concerns. Tele and IV removed, tolerated well.

## 2020-09-04 NOTE — Progress Notes (Signed)
Patient taken to dialysis; plan to DC home after.

## 2020-09-04 NOTE — Plan of Care (Signed)

## 2020-09-04 NOTE — TOC Initial Note (Signed)
Transition of Care [facility]) - Initial/Assessment Note    Patient Details  Name: Albert Eaton MRN: [id number] Date of Birth: Oct 09, 1966  Transition [facility]) CM/SW Contact:    Zenon Mayo, RN Phone Number: 09/04/2020, 9:28 AM  Clinical Narrative:                 NCM spoke with patient at bedside, asked if he wanted a [facility] for CHF disease management, he states yes, NCM offered choice, he states he does not have a preference.  NCM made referral to [facility] with [facility].  Awaiting call back to see if Alvis Lemmings can take referral.   Expected Discharge Plan: [place] Barriers to Discharge: No Barriers Identified   Patient Goals and CMS Choice Patient states their goals for this hospitalization and ongoing recovery are:: get a kidney so he can go back to work, [employer].gov Compare Post Acute Care list provided to:: Patient Choice offered to / list presented to : Patient  Expected Discharge Plan and Services Expected Discharge Plan: [place] In-house Referral: NA Discharge Planning Services: CM Consult Post Acute Care Choice: [place] arrangements for the past 2 months: Single Family Home Expected Discharge Date: 09/04/20                 DME Agency: NA       HH Arranged: RN, Disease Management [place] Agency: [place] Date [facility] Agency Contacted: 09/04/20 Time HH Agency Contacted: [phone] Representative spoke with at [place]: Tommi Rumps  Prior Living Arrangements/Services Living arrangements for the past 2 months: [place] Lives with:: Self Patient language and need for interpreter reviewed:: Yes Do you feel safe going back to the place where you live?: Yes      Need for Family Participation in Patient Care: No (Comment) Care giver support system in place?: No (comment)   Criminal Activity/Legal Involvement Pertinent to Current Situation/Hospitalization: No - Comment as needed  Activities of Daily  Living Home Assistive Devices/Equipment: CPAP, Other (Comment) ADL Screening (condition at time of admission) Patient's cognitive ability adequate to safely complete daily activities?: Yes Is the patient deaf or have difficulty hearing?: No Does the patient have difficulty seeing, even when wearing glasses/contacts?: No Does the patient have difficulty concentrating, remembering, or making decisions?: No Patient able to express need for assistance with ADLs?: Yes Does the patient have difficulty dressing or bathing?: No Independently performs ADLs?: Yes (appropriate for developmental age) Does the patient have difficulty walking or climbing stairs?: No Weakness of Legs: None Weakness of Arms/Hands: None  Permission Sought/Granted                  Emotional Assessment Appearance:: Appears stated age Attitude/Demeanor/Rapport: Engaged Affect (typically observed): Appropriate Orientation: : Oriented to Self, Oriented to Place, Oriented to  Time, Oriented to Situation Alcohol / Substance Use: Not Applicable Psych Involvement: No (comment)  Admission diagnosis:  CHF (congestive heart failure) ([place]) [I50.9] Community acquired pneumonia, unspecified laterality [J18.9] Patient Active Problem List   Diagnosis Date Noted  . Lower abdominal pain 06/26/2020  . Pulsatile tinnitus of right ear 11/21/2019  . Kidney failure 11/21/2019  . Acute diastolic CHF (congestive heart failure) ([place]) 11/21/2019  . Chills (without fever) 11/07/2019  . Hypercalcemia 10/17/2019  . Dependence on renal dialysis ([place]) 06/06/2019  . Coagulation defect, unspecified ([place]) 05/23/2019  . Iron deficiency anemia, unspecified 05/16/2019  . Anemia in other chronic diseases classified elsewhere 05/13/2019  .  Arteriovenous fistula, acquired ([place]) 05/13/2019  . Body mass index (BMI) 28.0-28.9, adult 05/13/2019  . Crohn's disease of small intestine without complications ([place]) 08/20/8526  . Encounter for screening  for respiratory tuberculosis 05/13/2019  . Gout, unspecified 05/13/2019  . Nausea 05/13/2019  . Other fatigue 05/13/2019  . Secondary hyperparathyroidism of renal origin ([place]) 05/13/2019  . End stage renal disease ([place]) 05/13/2019  . Chronic diastolic (congestive) heart failure ([place]) 05/13/2019  . Chronic kidney disease, unspecified 05/13/2019  . Obstructive sleep apnea 05/13/2019  . Discomfort of right ear 02/18/2019  . Hypokalemia 06/13/2018  . Abnormal CT scan, small bowel   . Perennial allergic rhinitis with a predominant nonallergic component 02/09/2018  . Allergic conjunctivitis 02/09/2018  . Mild intermittent asthma 02/09/2018  . Atherosclerosis 12/09/2017  . Hypothyroidism 11/19/2017  . Mixed hyperlipidemia 10/13/2017  . Personal history of gout 10/13/2017  . S/P cholecystectomy 10/13/2017  . Congestive heart failure with LV diastolic dysfunction, NYHA class 1 ([place]) 10/13/2017  . Hypothyroidism (acquired) 10/13/2017  . 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 SOB (shortness of breath) 06/07/2017  . Arthritis, gouty 01/27/2017  . Chronic diastolic CHF (congestive heart failure) ([place]) 01/06/2017  . OSA (obstructive sleep apnea) 01/06/2017  . Abnormal electrocardiogram (ECG) (EKG) 10/31/2016  . Chest pain 10/09/2016  . HIV disease ([place]) 10/09/2016  . Hypertensive heart disease with CHF (congestive heart failure) ([place]) 10/09/2016  . Left-sided low back pain without sciatica 05/29/2015  . NYHA class 2 heart failure with preserved ejection fraction ([place]) 05/15/2015  . PAH (pulmonary artery hypertension) ([place]) 05/08/2015  . Non-rheumatic mitral regurgitation 05/04/2015  . Prolonged Q-T interval on ECG 05/04/2015   PCP:  [name], FNP Pharmacy:   [facility] [place], [place] [address] Phone: [phone] Fax: [phone]     Social Determinants of  Health (SDOH) Interventions    Readmission Risk Interventions Readmission Risk Prevention Plan 09/04/2020  Transportation Screening Complete  Medication Review ([place]) Complete  PCP or Specialist appointment within 3-5 days of discharge Complete  HRI or [place] Complete  SW Recovery Care/Counseling Consult Complete  [place] Not Applicable  Some recent data might be hidden

## 2020-09-05 ENCOUNTER — Encounter (HOSPITAL_COMMUNITY): Payer: Self-pay | Admitting: Internal Medicine

## 2020-09-06 DIAGNOSIS — D509 Iron deficiency anemia, unspecified: Secondary | ICD-10-CM | POA: Diagnosis not present

## 2020-09-06 DIAGNOSIS — N2581 Secondary hyperparathyroidism of renal origin: Secondary | ICD-10-CM | POA: Diagnosis not present

## 2020-09-06 DIAGNOSIS — R3982 Chronic bladder pain: Secondary | ICD-10-CM | POA: Diagnosis not present

## 2020-09-06 DIAGNOSIS — N186 End stage renal disease: Secondary | ICD-10-CM | POA: Diagnosis not present

## 2020-09-06 DIAGNOSIS — D631 Anemia in chronic kidney disease: Secondary | ICD-10-CM | POA: Diagnosis not present

## 2020-09-06 DIAGNOSIS — K7689 Other specified diseases of liver: Secondary | ICD-10-CM | POA: Diagnosis not present

## 2020-09-06 DIAGNOSIS — E876 Hypokalemia: Secondary | ICD-10-CM | POA: Diagnosis not present

## 2020-09-06 DIAGNOSIS — Z79899 Other long term (current) drug therapy: Secondary | ICD-10-CM | POA: Diagnosis not present

## 2020-09-06 DIAGNOSIS — E8770 Fluid overload, unspecified: Secondary | ICD-10-CM | POA: Diagnosis not present

## 2020-09-06 DIAGNOSIS — Z4931 Encounter for adequacy testing for hemodialysis: Secondary | ICD-10-CM | POA: Diagnosis not present

## 2020-09-06 DIAGNOSIS — E440 Moderate protein-calorie malnutrition: Secondary | ICD-10-CM | POA: Diagnosis not present

## 2020-09-06 DIAGNOSIS — R82998 Other abnormal findings in urine: Secondary | ICD-10-CM | POA: Diagnosis not present

## 2020-09-06 DIAGNOSIS — Z992 Dependence on renal dialysis: Secondary | ICD-10-CM | POA: Diagnosis not present

## 2020-09-07 DIAGNOSIS — E440 Moderate protein-calorie malnutrition: Secondary | ICD-10-CM | POA: Diagnosis not present

## 2020-09-07 DIAGNOSIS — D631 Anemia in chronic kidney disease: Secondary | ICD-10-CM | POA: Diagnosis not present

## 2020-09-07 DIAGNOSIS — K7689 Other specified diseases of liver: Secondary | ICD-10-CM | POA: Diagnosis not present

## 2020-09-07 DIAGNOSIS — R82998 Other abnormal findings in urine: Secondary | ICD-10-CM | POA: Diagnosis not present

## 2020-09-07 DIAGNOSIS — N2581 Secondary hyperparathyroidism of renal origin: Secondary | ICD-10-CM | POA: Diagnosis not present

## 2020-09-07 DIAGNOSIS — N186 End stage renal disease: Secondary | ICD-10-CM | POA: Diagnosis not present

## 2020-09-07 DIAGNOSIS — Z79899 Other long term (current) drug therapy: Secondary | ICD-10-CM | POA: Diagnosis not present

## 2020-09-07 DIAGNOSIS — E876 Hypokalemia: Secondary | ICD-10-CM | POA: Diagnosis not present

## 2020-09-07 DIAGNOSIS — D509 Iron deficiency anemia, unspecified: Secondary | ICD-10-CM | POA: Diagnosis not present

## 2020-09-07 DIAGNOSIS — E8770 Fluid overload, unspecified: Secondary | ICD-10-CM | POA: Diagnosis not present

## 2020-09-07 DIAGNOSIS — Z4931 Encounter for adequacy testing for hemodialysis: Secondary | ICD-10-CM | POA: Diagnosis not present

## 2020-09-07 DIAGNOSIS — Z992 Dependence on renal dialysis: Secondary | ICD-10-CM | POA: Diagnosis not present

## 2020-09-08 ENCOUNTER — Other Ambulatory Visit: Payer: Medicare Other

## 2020-09-08 ENCOUNTER — Other Ambulatory Visit: Payer: Self-pay | Admitting: Family Medicine

## 2020-09-08 DIAGNOSIS — E039 Hypothyroidism, unspecified: Secondary | ICD-10-CM

## 2020-09-10 DIAGNOSIS — N186 End stage renal disease: Secondary | ICD-10-CM | POA: Diagnosis not present

## 2020-09-10 DIAGNOSIS — R82998 Other abnormal findings in urine: Secondary | ICD-10-CM | POA: Diagnosis not present

## 2020-09-10 DIAGNOSIS — E8770 Fluid overload, unspecified: Secondary | ICD-10-CM | POA: Diagnosis not present

## 2020-09-10 DIAGNOSIS — Z992 Dependence on renal dialysis: Secondary | ICD-10-CM | POA: Diagnosis not present

## 2020-09-10 DIAGNOSIS — K7689 Other specified diseases of liver: Secondary | ICD-10-CM | POA: Diagnosis not present

## 2020-09-10 DIAGNOSIS — Z79899 Other long term (current) drug therapy: Secondary | ICD-10-CM | POA: Diagnosis not present

## 2020-09-10 DIAGNOSIS — E876 Hypokalemia: Secondary | ICD-10-CM | POA: Diagnosis not present

## 2020-09-10 DIAGNOSIS — D509 Iron deficiency anemia, unspecified: Secondary | ICD-10-CM | POA: Diagnosis not present

## 2020-09-10 DIAGNOSIS — Z4931 Encounter for adequacy testing for hemodialysis: Secondary | ICD-10-CM | POA: Diagnosis not present

## 2020-09-10 DIAGNOSIS — N2581 Secondary hyperparathyroidism of renal origin: Secondary | ICD-10-CM | POA: Diagnosis not present

## 2020-09-10 DIAGNOSIS — D631 Anemia in chronic kidney disease: Secondary | ICD-10-CM | POA: Diagnosis not present

## 2020-09-10 DIAGNOSIS — E440 Moderate protein-calorie malnutrition: Secondary | ICD-10-CM | POA: Diagnosis not present

## 2020-09-11 DIAGNOSIS — Z79899 Other long term (current) drug therapy: Secondary | ICD-10-CM | POA: Diagnosis not present

## 2020-09-11 DIAGNOSIS — Z992 Dependence on renal dialysis: Secondary | ICD-10-CM | POA: Diagnosis not present

## 2020-09-11 DIAGNOSIS — Z4931 Encounter for adequacy testing for hemodialysis: Secondary | ICD-10-CM | POA: Diagnosis not present

## 2020-09-11 DIAGNOSIS — K7689 Other specified diseases of liver: Secondary | ICD-10-CM | POA: Diagnosis not present

## 2020-09-11 DIAGNOSIS — D631 Anemia in chronic kidney disease: Secondary | ICD-10-CM | POA: Diagnosis not present

## 2020-09-11 DIAGNOSIS — N2581 Secondary hyperparathyroidism of renal origin: Secondary | ICD-10-CM | POA: Diagnosis not present

## 2020-09-11 DIAGNOSIS — R82998 Other abnormal findings in urine: Secondary | ICD-10-CM | POA: Diagnosis not present

## 2020-09-11 DIAGNOSIS — E8770 Fluid overload, unspecified: Secondary | ICD-10-CM | POA: Diagnosis not present

## 2020-09-11 DIAGNOSIS — D509 Iron deficiency anemia, unspecified: Secondary | ICD-10-CM | POA: Diagnosis not present

## 2020-09-11 DIAGNOSIS — N186 End stage renal disease: Secondary | ICD-10-CM | POA: Diagnosis not present

## 2020-09-11 DIAGNOSIS — E440 Moderate protein-calorie malnutrition: Secondary | ICD-10-CM | POA: Diagnosis not present

## 2020-09-11 DIAGNOSIS — E876 Hypokalemia: Secondary | ICD-10-CM | POA: Diagnosis not present

## 2020-09-12 ENCOUNTER — Other Ambulatory Visit: Payer: Self-pay | Admitting: Allergy & Immunology

## 2020-09-13 DIAGNOSIS — Z4931 Encounter for adequacy testing for hemodialysis: Secondary | ICD-10-CM | POA: Diagnosis not present

## 2020-09-13 DIAGNOSIS — D509 Iron deficiency anemia, unspecified: Secondary | ICD-10-CM | POA: Diagnosis not present

## 2020-09-13 DIAGNOSIS — R82998 Other abnormal findings in urine: Secondary | ICD-10-CM | POA: Diagnosis not present

## 2020-09-13 DIAGNOSIS — K7689 Other specified diseases of liver: Secondary | ICD-10-CM | POA: Diagnosis not present

## 2020-09-13 DIAGNOSIS — E876 Hypokalemia: Secondary | ICD-10-CM | POA: Diagnosis not present

## 2020-09-13 DIAGNOSIS — N186 End stage renal disease: Secondary | ICD-10-CM | POA: Diagnosis not present

## 2020-09-13 DIAGNOSIS — Z79899 Other long term (current) drug therapy: Secondary | ICD-10-CM | POA: Diagnosis not present

## 2020-09-13 DIAGNOSIS — E440 Moderate protein-calorie malnutrition: Secondary | ICD-10-CM | POA: Diagnosis not present

## 2020-09-13 DIAGNOSIS — N2581 Secondary hyperparathyroidism of renal origin: Secondary | ICD-10-CM | POA: Diagnosis not present

## 2020-09-13 DIAGNOSIS — D631 Anemia in chronic kidney disease: Secondary | ICD-10-CM | POA: Diagnosis not present

## 2020-09-13 DIAGNOSIS — Z992 Dependence on renal dialysis: Secondary | ICD-10-CM | POA: Diagnosis not present

## 2020-09-13 DIAGNOSIS — E8770 Fluid overload, unspecified: Secondary | ICD-10-CM | POA: Diagnosis not present

## 2020-09-14 ENCOUNTER — Other Ambulatory Visit: Payer: Self-pay | Admitting: Family Medicine

## 2020-09-14 DIAGNOSIS — R82998 Other abnormal findings in urine: Secondary | ICD-10-CM | POA: Diagnosis not present

## 2020-09-14 DIAGNOSIS — M109 Gout, unspecified: Secondary | ICD-10-CM

## 2020-09-14 DIAGNOSIS — D509 Iron deficiency anemia, unspecified: Secondary | ICD-10-CM | POA: Diagnosis not present

## 2020-09-14 DIAGNOSIS — E8770 Fluid overload, unspecified: Secondary | ICD-10-CM | POA: Diagnosis not present

## 2020-09-14 DIAGNOSIS — D631 Anemia in chronic kidney disease: Secondary | ICD-10-CM | POA: Diagnosis not present

## 2020-09-14 DIAGNOSIS — E440 Moderate protein-calorie malnutrition: Secondary | ICD-10-CM | POA: Diagnosis not present

## 2020-09-14 DIAGNOSIS — Z992 Dependence on renal dialysis: Secondary | ICD-10-CM | POA: Diagnosis not present

## 2020-09-14 DIAGNOSIS — E876 Hypokalemia: Secondary | ICD-10-CM | POA: Diagnosis not present

## 2020-09-14 DIAGNOSIS — N2581 Secondary hyperparathyroidism of renal origin: Secondary | ICD-10-CM | POA: Diagnosis not present

## 2020-09-14 DIAGNOSIS — K7689 Other specified diseases of liver: Secondary | ICD-10-CM | POA: Diagnosis not present

## 2020-09-14 DIAGNOSIS — Z4931 Encounter for adequacy testing for hemodialysis: Secondary | ICD-10-CM | POA: Diagnosis not present

## 2020-09-14 DIAGNOSIS — N186 End stage renal disease: Secondary | ICD-10-CM | POA: Diagnosis not present

## 2020-09-14 DIAGNOSIS — Z79899 Other long term (current) drug therapy: Secondary | ICD-10-CM | POA: Diagnosis not present

## 2020-09-17 DIAGNOSIS — K7689 Other specified diseases of liver: Secondary | ICD-10-CM | POA: Diagnosis not present

## 2020-09-17 DIAGNOSIS — N186 End stage renal disease: Secondary | ICD-10-CM | POA: Diagnosis not present

## 2020-09-17 DIAGNOSIS — Z992 Dependence on renal dialysis: Secondary | ICD-10-CM | POA: Diagnosis not present

## 2020-09-17 DIAGNOSIS — D631 Anemia in chronic kidney disease: Secondary | ICD-10-CM | POA: Diagnosis not present

## 2020-09-17 DIAGNOSIS — D509 Iron deficiency anemia, unspecified: Secondary | ICD-10-CM | POA: Diagnosis not present

## 2020-09-17 DIAGNOSIS — N2581 Secondary hyperparathyroidism of renal origin: Secondary | ICD-10-CM | POA: Diagnosis not present

## 2020-09-17 DIAGNOSIS — R82998 Other abnormal findings in urine: Secondary | ICD-10-CM | POA: Diagnosis not present

## 2020-09-17 DIAGNOSIS — Z4931 Encounter for adequacy testing for hemodialysis: Secondary | ICD-10-CM | POA: Diagnosis not present

## 2020-09-17 DIAGNOSIS — E8770 Fluid overload, unspecified: Secondary | ICD-10-CM | POA: Diagnosis not present

## 2020-09-17 DIAGNOSIS — E876 Hypokalemia: Secondary | ICD-10-CM | POA: Diagnosis not present

## 2020-09-17 DIAGNOSIS — Z79899 Other long term (current) drug therapy: Secondary | ICD-10-CM | POA: Diagnosis not present

## 2020-09-17 DIAGNOSIS — E440 Moderate protein-calorie malnutrition: Secondary | ICD-10-CM | POA: Diagnosis not present

## 2020-09-17 NOTE — Telephone Encounter (Signed)
Please see patient request.

## 2020-09-18 DIAGNOSIS — N186 End stage renal disease: Secondary | ICD-10-CM | POA: Diagnosis not present

## 2020-09-18 DIAGNOSIS — R82998 Other abnormal findings in urine: Secondary | ICD-10-CM | POA: Diagnosis not present

## 2020-09-18 DIAGNOSIS — E440 Moderate protein-calorie malnutrition: Secondary | ICD-10-CM | POA: Diagnosis not present

## 2020-09-18 DIAGNOSIS — Z79899 Other long term (current) drug therapy: Secondary | ICD-10-CM | POA: Diagnosis not present

## 2020-09-18 DIAGNOSIS — D631 Anemia in chronic kidney disease: Secondary | ICD-10-CM | POA: Diagnosis not present

## 2020-09-18 DIAGNOSIS — K7689 Other specified diseases of liver: Secondary | ICD-10-CM | POA: Diagnosis not present

## 2020-09-18 DIAGNOSIS — Z4931 Encounter for adequacy testing for hemodialysis: Secondary | ICD-10-CM | POA: Diagnosis not present

## 2020-09-18 DIAGNOSIS — D509 Iron deficiency anemia, unspecified: Secondary | ICD-10-CM | POA: Diagnosis not present

## 2020-09-18 DIAGNOSIS — E876 Hypokalemia: Secondary | ICD-10-CM | POA: Diagnosis not present

## 2020-09-18 DIAGNOSIS — E8770 Fluid overload, unspecified: Secondary | ICD-10-CM | POA: Diagnosis not present

## 2020-09-18 DIAGNOSIS — Z992 Dependence on renal dialysis: Secondary | ICD-10-CM | POA: Diagnosis not present

## 2020-09-18 DIAGNOSIS — N2581 Secondary hyperparathyroidism of renal origin: Secondary | ICD-10-CM | POA: Diagnosis not present

## 2020-09-20 DIAGNOSIS — Z79899 Other long term (current) drug therapy: Secondary | ICD-10-CM | POA: Diagnosis not present

## 2020-09-20 DIAGNOSIS — D631 Anemia in chronic kidney disease: Secondary | ICD-10-CM | POA: Diagnosis not present

## 2020-09-20 DIAGNOSIS — E440 Moderate protein-calorie malnutrition: Secondary | ICD-10-CM | POA: Diagnosis not present

## 2020-09-20 DIAGNOSIS — K7689 Other specified diseases of liver: Secondary | ICD-10-CM | POA: Diagnosis not present

## 2020-09-20 DIAGNOSIS — D509 Iron deficiency anemia, unspecified: Secondary | ICD-10-CM | POA: Diagnosis not present

## 2020-09-20 DIAGNOSIS — E8770 Fluid overload, unspecified: Secondary | ICD-10-CM | POA: Diagnosis not present

## 2020-09-20 DIAGNOSIS — Z992 Dependence on renal dialysis: Secondary | ICD-10-CM | POA: Diagnosis not present

## 2020-09-20 DIAGNOSIS — N2581 Secondary hyperparathyroidism of renal origin: Secondary | ICD-10-CM | POA: Diagnosis not present

## 2020-09-20 DIAGNOSIS — R82998 Other abnormal findings in urine: Secondary | ICD-10-CM | POA: Diagnosis not present

## 2020-09-20 DIAGNOSIS — Z4931 Encounter for adequacy testing for hemodialysis: Secondary | ICD-10-CM | POA: Diagnosis not present

## 2020-09-20 DIAGNOSIS — E876 Hypokalemia: Secondary | ICD-10-CM | POA: Diagnosis not present

## 2020-09-20 DIAGNOSIS — N186 End stage renal disease: Secondary | ICD-10-CM | POA: Diagnosis not present

## 2020-09-21 DIAGNOSIS — D631 Anemia in chronic kidney disease: Secondary | ICD-10-CM | POA: Diagnosis not present

## 2020-09-21 DIAGNOSIS — D509 Iron deficiency anemia, unspecified: Secondary | ICD-10-CM | POA: Diagnosis not present

## 2020-09-21 DIAGNOSIS — E8770 Fluid overload, unspecified: Secondary | ICD-10-CM | POA: Diagnosis not present

## 2020-09-21 DIAGNOSIS — Z992 Dependence on renal dialysis: Secondary | ICD-10-CM | POA: Diagnosis not present

## 2020-09-21 DIAGNOSIS — N2581 Secondary hyperparathyroidism of renal origin: Secondary | ICD-10-CM | POA: Diagnosis not present

## 2020-09-21 DIAGNOSIS — R82998 Other abnormal findings in urine: Secondary | ICD-10-CM | POA: Diagnosis not present

## 2020-09-21 DIAGNOSIS — Z79899 Other long term (current) drug therapy: Secondary | ICD-10-CM | POA: Diagnosis not present

## 2020-09-21 DIAGNOSIS — Z4931 Encounter for adequacy testing for hemodialysis: Secondary | ICD-10-CM | POA: Diagnosis not present

## 2020-09-21 DIAGNOSIS — K7689 Other specified diseases of liver: Secondary | ICD-10-CM | POA: Diagnosis not present

## 2020-09-21 DIAGNOSIS — E876 Hypokalemia: Secondary | ICD-10-CM | POA: Diagnosis not present

## 2020-09-21 DIAGNOSIS — N186 End stage renal disease: Secondary | ICD-10-CM | POA: Diagnosis not present

## 2020-09-21 DIAGNOSIS — E440 Moderate protein-calorie malnutrition: Secondary | ICD-10-CM | POA: Diagnosis not present

## 2020-09-24 ENCOUNTER — Other Ambulatory Visit: Payer: Self-pay | Admitting: Infectious Diseases

## 2020-09-24 ENCOUNTER — Encounter: Payer: Self-pay | Admitting: Dermatology

## 2020-09-24 ENCOUNTER — Ambulatory Visit (INDEPENDENT_AMBULATORY_CARE_PROVIDER_SITE_OTHER): Payer: Medicare Other | Admitting: Dermatology

## 2020-09-24 ENCOUNTER — Other Ambulatory Visit: Payer: Self-pay

## 2020-09-24 DIAGNOSIS — D631 Anemia in chronic kidney disease: Secondary | ICD-10-CM | POA: Diagnosis not present

## 2020-09-24 DIAGNOSIS — R82998 Other abnormal findings in urine: Secondary | ICD-10-CM | POA: Diagnosis not present

## 2020-09-24 DIAGNOSIS — N2581 Secondary hyperparathyroidism of renal origin: Secondary | ICD-10-CM | POA: Diagnosis not present

## 2020-09-24 DIAGNOSIS — Z4931 Encounter for adequacy testing for hemodialysis: Secondary | ICD-10-CM | POA: Diagnosis not present

## 2020-09-24 DIAGNOSIS — E876 Hypokalemia: Secondary | ICD-10-CM | POA: Diagnosis not present

## 2020-09-24 DIAGNOSIS — Z992 Dependence on renal dialysis: Secondary | ICD-10-CM | POA: Diagnosis not present

## 2020-09-24 DIAGNOSIS — K7689 Other specified diseases of liver: Secondary | ICD-10-CM | POA: Diagnosis not present

## 2020-09-24 DIAGNOSIS — L83 Acanthosis nigricans: Secondary | ICD-10-CM | POA: Diagnosis not present

## 2020-09-24 DIAGNOSIS — D509 Iron deficiency anemia, unspecified: Secondary | ICD-10-CM | POA: Diagnosis not present

## 2020-09-24 DIAGNOSIS — Z79899 Other long term (current) drug therapy: Secondary | ICD-10-CM | POA: Diagnosis not present

## 2020-09-24 DIAGNOSIS — E8770 Fluid overload, unspecified: Secondary | ICD-10-CM | POA: Diagnosis not present

## 2020-09-24 DIAGNOSIS — N186 End stage renal disease: Secondary | ICD-10-CM | POA: Diagnosis not present

## 2020-09-24 DIAGNOSIS — B20 Human immunodeficiency virus [HIV] disease: Secondary | ICD-10-CM

## 2020-09-24 DIAGNOSIS — E440 Moderate protein-calorie malnutrition: Secondary | ICD-10-CM | POA: Diagnosis not present

## 2020-09-24 MED ORDER — PIMECROLIMUS 1 % EX CREA: TOPICAL_CREAM | CUTANEOUS | 0 refills | Status: DC

## 2020-09-25 ENCOUNTER — Telehealth: Payer: Self-pay | Admitting: Cardiovascular Disease

## 2020-09-25 ENCOUNTER — Telehealth: Payer: Self-pay

## 2020-09-25 DIAGNOSIS — Z79899 Other long term (current) drug therapy: Secondary | ICD-10-CM | POA: Diagnosis not present

## 2020-09-25 DIAGNOSIS — N2581 Secondary hyperparathyroidism of renal origin: Secondary | ICD-10-CM | POA: Diagnosis not present

## 2020-09-25 DIAGNOSIS — Z4931 Encounter for adequacy testing for hemodialysis: Secondary | ICD-10-CM | POA: Diagnosis not present

## 2020-09-25 DIAGNOSIS — E8770 Fluid overload, unspecified: Secondary | ICD-10-CM | POA: Diagnosis not present

## 2020-09-25 DIAGNOSIS — N186 End stage renal disease: Secondary | ICD-10-CM | POA: Diagnosis not present

## 2020-09-25 DIAGNOSIS — K7689 Other specified diseases of liver: Secondary | ICD-10-CM | POA: Diagnosis not present

## 2020-09-25 DIAGNOSIS — D509 Iron deficiency anemia, unspecified: Secondary | ICD-10-CM | POA: Diagnosis not present

## 2020-09-25 DIAGNOSIS — Z992 Dependence on renal dialysis: Secondary | ICD-10-CM | POA: Diagnosis not present

## 2020-09-25 DIAGNOSIS — D631 Anemia in chronic kidney disease: Secondary | ICD-10-CM | POA: Diagnosis not present

## 2020-09-25 DIAGNOSIS — E440 Moderate protein-calorie malnutrition: Secondary | ICD-10-CM | POA: Diagnosis not present

## 2020-09-25 DIAGNOSIS — R82998 Other abnormal findings in urine: Secondary | ICD-10-CM | POA: Diagnosis not present

## 2020-09-25 DIAGNOSIS — E876 Hypokalemia: Secondary | ICD-10-CM | POA: Diagnosis not present

## 2020-09-25 DIAGNOSIS — I129 Hypertensive chronic kidney disease with stage 1 through stage 4 chronic kidney disease, or unspecified chronic kidney disease: Secondary | ICD-10-CM | POA: Diagnosis not present

## 2020-09-25 NOTE — Telephone Encounter (Signed)
[facility] Key: [id number] - PA Case ID: [id number] - Rx #: [id number] Need help? Call us at [phone] Status Sent to [facility] Pimecrolimus 1% cream Form [employer] Medicare Part D Electronic Prior Authorization Form (2017 NCPDP) Original Claim Info [phone] Provide Exception Process Printed NoticeALA-CORT;AUG BETAMETH PREFDr. Submit ePA at [email or web address] Requires Prior Authorization

## 2020-09-25 NOTE — Telephone Encounter (Signed)
° °  Pt c/o of Chest Pain: STAT if CP now or developed within 24 hours  1. Are you having CP right now? No  2. Are you experiencing any other symptoms (ex. SOB, nausea, vomiting, sweating)? SOB  3. How long have you been experiencing CP? Been 2 weeks  4. Is your CP continuous or coming and going? Coming and going  5. Have you taken Nitroglycerin? No   Pt said he needs to be seen sooner since he's been feeling CP, SOB and palpitations. He said he can see Dr. [name] or APP as long as its a sooner appt ?

## 2020-09-26 ENCOUNTER — Encounter: Payer: Self-pay | Admitting: Dermatology

## 2020-09-26 DIAGNOSIS — R1030 Lower abdominal pain, unspecified: Secondary | ICD-10-CM | POA: Diagnosis not present

## 2020-09-26 DIAGNOSIS — R3982 Chronic bladder pain: Secondary | ICD-10-CM | POA: Diagnosis not present

## 2020-09-26 DIAGNOSIS — M62838 Other muscle spasm: Secondary | ICD-10-CM | POA: Diagnosis not present

## 2020-09-26 DIAGNOSIS — M6281 Muscle weakness (generalized): Secondary | ICD-10-CM | POA: Diagnosis not present

## 2020-09-26 DIAGNOSIS — M6289 Other specified disorders of muscle: Secondary | ICD-10-CM | POA: Diagnosis not present

## 2020-09-26 NOTE — Telephone Encounter (Signed)
The patient reports shortness of breath and CP for over a week.  He does have active, mild symptoms now. He is not in audible distress over the phone.  He has no VS to share. The patient does not want to go to the ER. Offered him an appointment tomorrow but he declined. Scheduled him this Friday with Almyra Deforest. ER precautions reviewed. He was grateful for assistance.

## 2020-09-26 NOTE — Progress Notes (Signed)
   Follow-Up Visit   Subjective  Albert Eaton is a 54 y.o. male who presents for the following: Follow-up (rash underarms tx- over the counter lotion anti itch- helps alittle).  Rash Location: Under arm Duration:  Quality: Little improvement Associated Signs/Symptoms: Itch Modifying Factors: Over-the-counter pramoxine-based antipruritic Severity:  Timing: Context:   Objective  Well appearing patient in no apparent distress; mood and affect are within normal limits.  All skin waist up examined.   Assessment & Plan    Acanthosis nigricans (2) Left Axilla; Right Axilla  I again explained to [name] that there is no FDA approved therapy for acanthosis nigricans.  Some of his itching could be related to his renal disease.  Since I would prefer to avoid using cortisone and intertriginous areas, we will instead prescribe tacrolimus ointment and will use topical therapy X 4 weeks then contact the office via MyChart or by phone with an update.  pimecrolimus (ELIDEL) 1 % cream - Left Axilla, Right Axilla     I, Lavonna Monarch, MD, have reviewed all documentation for this visit.  The documentation on 09/26/20 for the exam, diagnosis, procedures, and orders are all accurate and complete.

## 2020-09-27 DIAGNOSIS — D631 Anemia in chronic kidney disease: Secondary | ICD-10-CM | POA: Diagnosis not present

## 2020-09-27 DIAGNOSIS — N2581 Secondary hyperparathyroidism of renal origin: Secondary | ICD-10-CM | POA: Diagnosis not present

## 2020-09-27 DIAGNOSIS — Z4931 Encounter for adequacy testing for hemodialysis: Secondary | ICD-10-CM | POA: Diagnosis not present

## 2020-09-27 DIAGNOSIS — Z992 Dependence on renal dialysis: Secondary | ICD-10-CM | POA: Diagnosis not present

## 2020-09-27 DIAGNOSIS — N186 End stage renal disease: Secondary | ICD-10-CM | POA: Diagnosis not present

## 2020-09-27 DIAGNOSIS — E871 Hypo-osmolality and hyponatremia: Secondary | ICD-10-CM | POA: Diagnosis not present

## 2020-09-27 DIAGNOSIS — E8770 Fluid overload, unspecified: Secondary | ICD-10-CM | POA: Diagnosis not present

## 2020-09-27 DIAGNOSIS — R82998 Other abnormal findings in urine: Secondary | ICD-10-CM | POA: Diagnosis not present

## 2020-09-27 DIAGNOSIS — E440 Moderate protein-calorie malnutrition: Secondary | ICD-10-CM | POA: Diagnosis not present

## 2020-09-27 DIAGNOSIS — D509 Iron deficiency anemia, unspecified: Secondary | ICD-10-CM | POA: Diagnosis not present

## 2020-09-27 DIAGNOSIS — Z79899 Other long term (current) drug therapy: Secondary | ICD-10-CM | POA: Diagnosis not present

## 2020-09-27 DIAGNOSIS — E876 Hypokalemia: Secondary | ICD-10-CM | POA: Diagnosis not present

## 2020-09-28 ENCOUNTER — Other Ambulatory Visit: Payer: Self-pay

## 2020-09-28 ENCOUNTER — Ambulatory Visit (INDEPENDENT_AMBULATORY_CARE_PROVIDER_SITE_OTHER): Payer: Medicare Other | Admitting: Physician Assistant

## 2020-09-28 ENCOUNTER — Other Ambulatory Visit: Payer: Self-pay | Admitting: Allergy & Immunology

## 2020-09-28 ENCOUNTER — Encounter: Payer: Self-pay | Admitting: Physician Assistant

## 2020-09-28 ENCOUNTER — Telehealth: Payer: Self-pay | Admitting: *Deleted

## 2020-09-28 VITALS — BP 144/92 | HR 79 | Ht 70.0 in | Wt 198.8 lb

## 2020-09-28 DIAGNOSIS — I340 Nonrheumatic mitral (valve) insufficiency: Secondary | ICD-10-CM

## 2020-09-28 DIAGNOSIS — N186 End stage renal disease: Secondary | ICD-10-CM

## 2020-09-28 DIAGNOSIS — N2581 Secondary hyperparathyroidism of renal origin: Secondary | ICD-10-CM | POA: Diagnosis not present

## 2020-09-28 DIAGNOSIS — R079 Chest pain, unspecified: Secondary | ICD-10-CM

## 2020-09-28 DIAGNOSIS — E440 Moderate protein-calorie malnutrition: Secondary | ICD-10-CM | POA: Diagnosis not present

## 2020-09-28 DIAGNOSIS — E876 Hypokalemia: Secondary | ICD-10-CM

## 2020-09-28 DIAGNOSIS — R82998 Other abnormal findings in urine: Secondary | ICD-10-CM | POA: Diagnosis not present

## 2020-09-28 DIAGNOSIS — Z79899 Other long term (current) drug therapy: Secondary | ICD-10-CM | POA: Diagnosis not present

## 2020-09-28 DIAGNOSIS — Z992 Dependence on renal dialysis: Secondary | ICD-10-CM | POA: Diagnosis not present

## 2020-09-28 DIAGNOSIS — E8770 Fluid overload, unspecified: Secondary | ICD-10-CM | POA: Diagnosis not present

## 2020-09-28 DIAGNOSIS — D509 Iron deficiency anemia, unspecified: Secondary | ICD-10-CM | POA: Diagnosis not present

## 2020-09-28 DIAGNOSIS — I5032 Chronic diastolic (congestive) heart failure: Secondary | ICD-10-CM | POA: Diagnosis not present

## 2020-09-28 DIAGNOSIS — Z4931 Encounter for adequacy testing for hemodialysis: Secondary | ICD-10-CM | POA: Diagnosis not present

## 2020-09-28 DIAGNOSIS — D631 Anemia in chronic kidney disease: Secondary | ICD-10-CM | POA: Diagnosis not present

## 2020-09-28 DIAGNOSIS — E871 Hypo-osmolality and hyponatremia: Secondary | ICD-10-CM | POA: Diagnosis not present

## 2020-09-28 LAB — BASIC METABOLIC PANEL
BUN/Creatinine Ratio: 6 — ABNORMAL LOW (ref 9–20)
BUN: 49 mg/dL — ABNORMAL HIGH (ref 6–24)
CO2: 28 mmol/L (ref 20–29)
Calcium: 10.6 mg/dL — ABNORMAL HIGH (ref 8.7–10.2)
Chloride: 91 mmol/L — ABNORMAL LOW (ref 96–106)
Creatinine, Ser: 8.18 mg/dL — ABNORMAL HIGH (ref 0.76–1.27)
GFR calc Af Amer: 8 mL/min/{1.73_m2} — ABNORMAL LOW (ref >59)
GFR calc non Af Amer: 7 mL/min/{1.73_m2} — ABNORMAL LOW (ref >59)
Glucose: 98 mg/dL (ref 65–99)
Potassium: 4.2 mmol/L (ref 3.5–5.2)
Sodium: 138 mmol/L (ref 134–144)

## 2020-09-28 NOTE — Progress Notes (Signed)
Potassium normalized.

## 2020-09-28 NOTE — Telephone Encounter (Signed)
Prior authorization   for Pimecrolimus cream is denied for patient. Will ask Dr. Denna Haggard next step for patient.

## 2020-09-28 NOTE — Patient Instructions (Signed)
Medication Instructions:  Your physician recommends that you continue on your current medications as directed. Please refer to the Current Medication list given to you today.  *If you need a refill on your cardiac medications before your next appointment, please call your pharmacy*   Lab Work: Your physician recommends that you return for lab work TODAY:   BMET  If you have labs (blood work) drawn today and your tests are completely normal, you will receive your results only by: Marland Kitchen MyChart Message (if you have MyChart) OR . A paper copy in the mail If you have any lab test that is abnormal or we need to change your treatment, we will call you to review the results.  Testing/Procedures: Your cardiac CT will be scheduled at one of the below locations:   [facility] [address] [place], [place] [address] [phone]  [place] [address] [place],  [address] [phone]  If scheduled at [facility], please arrive at the [facility] main entrance of [facility] 30 minutes prior to test start time. Proceed to the [facility] Radiology Department (first floor) to check-in and test prep.  If scheduled at [facility], please arrive 15 mins early for check-in and test prep.  Please follow these instructions carefully (unless otherwise directed):  Hold all erectile dysfunction medications at least 3 days (72 hrs) prior to test.  On the Night Before the Test: . Be sure to Drink plenty of water. . Do not consume any caffeinated/decaffeinated beverages or chocolate 12 hours prior to your test. . Do not take any antihistamines 12 hours prior to your test. . If the patient has contrast allergy: ? Patient will need a prescription for Prednisone and very clear instructions (as follows): 1. Prednisone 50 mg - take 13 hours prior to test 2. Take another Prednisone 50 mg  7 hours prior to test 3. Take another Prednisone 50 mg 1 hour prior to test 4. Take Benadryl 50 mg 1 hour prior to test . Patient must complete all four doses of above prophylactic medications. . Patient will need a ride after test due to Benadryl.  On the Day of the Test: . Drink plenty of water. Do not drink any water within one hour of the test. . Do not eat any food 4 hours prior to the test. . You may take your regular medications prior to the test.  . Take metoprolol (Lopressor) two hours prior to test. . HOLD Furosemide/Hydrochlorothiazide morning of the test.    *For Clinical Staff only. Please instruct patient the following:*        -Drink plenty of water       -Hold Furosemide/hydrochlorothiazide morning of the test       -Take metoprolol (Lopressor) 2 hours prior to test (if applicable).                  -If HR is less than 55 BPM- No Lopressor                -IF HR is greater than 55 BPM and patient is less than or equal to 33 yrs old Lopressor 123m x1.                -If HR is greater than 55 BPM and patient is greater than 730yrs old Lopressor 50 mg x1.     Do not give Lopressor to patients with  an allergy to lopressor or anyone with asthma or active COPD symptoms (currently taking steroids).       After the Test: . Drink plenty of water. . After receiving IV contrast, you may experience a mild flushed feeling. This is normal. . On occasion, you may experience a mild rash up to 24 hours after the test. This is not dangerous. If this occurs, you can take Benadryl 25 mg and increase your fluid intake. . If you experience trouble breathing, this can be serious. If it is severe call 911 IMMEDIATELY. If it is mild, please call our office. . If you take any of these medications: Glipizide/Metformin, Avandament, Glucavance, please do not take 48 hours after completing test unless otherwise instructed.   Once we have confirmed authorization from your insurance company, we  will call you to set up a date and time for your test. Based on how quickly your insurance processes prior authorizations requests, please allow up to 4 weeks to be contacted for scheduling your Cardiac CT appointment. Be advised that routine Cardiac CT appointments could be scheduled as many as 8 weeks after your provider has ordered it.  For non-scheduling related questions, please contact the cardiac imaging nurse navigator should you have any questions/concerns: Marchia Bond, Cardiac Imaging Nurse Navigator Burley Saver, Interim Cardiac Imaging Nurse [place] and Vascular Services Direct Office Dial: [phone]   For scheduling needs, including cancellations and rescheduling, please call [place], [phone].   Follow-Up: At [facility], you and your health needs are our priority.  As part of our continuing mission to provide you with exceptional heart care, we have created designated Provider Care Teams.  These Care Teams include your primary Cardiologist (physician) and Advanced Practice Providers (APPs -  Physician Assistants and Nurse Practitioners) who all work together to provide you with the care you need, when you need it.  Your next appointment:   Follow up as scheduled    The format for your next appointment:   In Person  Provider:   Truitt Merle, NP  Other Instructions

## 2020-09-28 NOTE — Progress Notes (Signed)
Cardiology Office Note:    Date:  09/30/2020   ID:  Albert Eaton, DOB 19-May-1966, MRN [id number]  PCP:  Albert Dew, FNP  [facility] Cardiologist:  Albert Mocha, MD  Albert Eaton:  None   Referring MD: Albert Dew, FNP   Chief Complaint  Patient presents with  . Follow-up    evaluate chest pain, seen for Dr. Burt Eaton    History of Present Illness:    Albert Eaton is a 54 y.o. male with a hx of ESRD on HD, chronic diastolic heart failure, MR, HIV, obstructive sleep apnea.  He presented to the hospital in 2017 with hypertensive urgency, acute diastolic heart failure and elevated troponin.  He was managed medically.  He progressed to end-stage renal disease in July 2020 and was placed on dialysis.  Patient was last seen by Dr. Burt Eaton on 01/12/2020 for chest discomfort at rest.  Echocardiogram performed on 01/31/2020 showed EF 60 to 65%, grade 2 DD, RVSP 31.3 mmHg, severe LAE, moderate MR.  Myocardial perfusion imaging performed on 01/31/2020 showed EF 51%, low risk study without ischemia or previous infarction.  More recently, patient was admitted in early November 2021 with shortness of breath for 2 weeks.  He was treated for both volume overload and community-acquired pneumonia.  He was febrile with T-max 102.4.  Covid negative.  2D echocardiogram performed during admission showed preserved EF but possible severe MR.  TEE performed on 09/03/2020 showed EF 50 to 55%, moderate to severe MR, mild AI.  During the admission he complained of some chest wall pain, this was felt to be noncardiac.  Patient was seen by Albert Eaton who recommended follow-up with Dr. Burt Eaton to further evaluate valve issue.  Patient was initially scheduled to see Albert Eaton, however due to persistent shortness of breath and chest pain, earlier appointment was set up for today  Talking with the patient, he has been having intermittent chest discomfort and worsening shortness of  breath for the past several weeks.  Chest pain occurs both at rest and with exertion.  Duration of the chest pain may last from minutes to hours at a time.  We discussed the recent hospitalization record, with his troponin being flat, suspicion is quite low for ACS.  However patient seems to be quite anxious about his chest discomfort and want to make sure this is not cardiac.  He was actively having chest pain in the clinic.  I ended up discussing his case with Albert Eaton, we reviewed his EKG which was normal.  Given some coronary calcification in the ostial LAD and the left circumflex artery on the recent CT of the chest, we eventually recommend a coronary CT since the patient has already reached dialysis level.  This is better arranged on the day when he is not receiving dialysis to allow adequate rate control prior to the procedure.      Past Medical History:  Diagnosis Date  . Anemia   . Aortic atherosclerosis ([place])   . Asthma   . Chronic diastolic CHF (congestive heart failure) ([place]) 01/06/2017   Echo 7/16 [facility] in [place], [name]) Mild AI, mild LAE, mild concentric LVH, EF 55, normal wall motion, mild to moderate MR, mild PI, RVSP 55 // Echo 10/09/16 ([facility]):  Moderate LVH, grade 2 diastolic dysfunction, mild MR, moderate LAE   . CKD (chronic kidney disease)    per pt, waiting on a kidney transplant in near future.  . Esophagitis   .  Gout   . Hepatitis    Hep B  . History of nuclear stress test    a. Nuc study 7/16: no scar or ischemia, EF 42 // b. Nuc study 12/17: EF 48, ?small apical ischemia, Low Risk  . HIV (human immunodeficiency virus infection) ([place])   . HLD (hyperlipidemia)   . Hypertension   . Hypertensive heart disease with CHF (congestive heart failure) ([place]) 10/09/2016  . Mitral regurgitation   . OSA (obstructive sleep apnea)   . Post-operative nausea and vomiting    1 time as a child  . Sleep apnea    uses c-pap  . Wears glasses   . Wears partial dentures       Past Surgical History:  Procedure Laterality Date  . AV FISTULA PLACEMENT Left 07/02/2018   Procedure: Creation of Left arm BRACHIOBASILIC ARTERIOVENOUS  FISTULA;  Surgeon: Marty Heck, MD;  Location: [place];  Service: Vascular;  Laterality: Left;  . BASCILIC VEIN TRANSPOSITION Left 08/27/2018   Procedure: Left arm BRACHIOBASILIC VEIN TRANSPOSITION SECOND STAGE;  Surgeon: Marty Heck, MD;  Location: [place];  Service: Vascular;  Laterality: Left;  . BUBBLE STUDY  09/03/2020   Procedure: BUBBLE STUDY;  Surgeon: Fay Records, MD;  Location: [place];  Service: Cardiovascular;;  . CHOLECYSTECTOMY    . COLONOSCOPY W/ BIOPSIES AND POLYPECTOMY    . GIVENS CAPSULE STUDY N/A 03/01/2018   Procedure: GIVENS CAPSULE STUDY;  Surgeon: Jerene Bears, MD;  Location: [place];  Service: Gastroenterology;  Laterality: N/A;  . HERNIA REPAIR     As baby  . MULTIPLE TOOTH EXTRACTIONS    . NOSE SURGERY    . RENAL BIOPSY    . TEE WITHOUT CARDIOVERSION N/A 09/03/2020   Procedure: TRANSESOPHAGEAL ECHOCARDIOGRAM (TEE);  Surgeon: Fay Records, MD;  Location: [facility] ENDOSCOPY;  Service: Cardiovascular;  Laterality: N/A;    Current Medications: Current Meds  Medication Sig  . albuterol (VENTOLIN HFA) 108 (90 Base) MCG/ACT inhaler INHALE 2 PUFFS INTO THE LUNGS EVERY 4 HOURS AS NEEDED FOR WHEEZING OR SHORTNESS OF BREATH  . allopurinol (ZYLOPRIM) 100 MG tablet TAKE 1 TABLET(100 MG) BY MOUTH TWICE DAILY  . amLODipine (NORVASC) 10 MG tablet Take 1 tablet (10 mg total) by mouth daily.  . Ascorbic Acid (VITAMIN C WITH ROSE HIPS) 1000 MG tablet Take 1,000 mg by mouth daily.  Marland Kitchen azelastine (ASTELIN) 0.1 % nasal spray Place 2 sprays into both nostrils 2 (two) times daily as needed for rhinitis. Use in each nostril as directed (Patient taking differently: Place 2 sprays into both nostrils 2 (two) times daily as needed for rhinitis. )  . B Complex-C-Zn-Folic Acid (DIALYVITE/ZINC) TABS Take 1 tablet by mouth  every evening.   . bictegravir-emtricitabine-tenofovir AF (BIKTARVY) 50-200-25 MG TABS tablet Take 1 tablet by mouth daily.  . calcitRIOL (ROCALTROL) 0.5 MCG capsule Take 0.5 mcg by mouth daily.   . carvedilol (COREG) 25 MG tablet TAKE 1 TABLET BY MOUTH TWICE DAILY WITH FOOD (Patient taking differently: Take 25 mg by mouth 2 (two) times daily with a meal. )  . famotidine (PEPCID) 20 MG tablet TAKE 1 TABLET(20 MG) BY MOUTH TWICE DAILY (Patient taking differently: Take 20 mg by mouth every evening. )  . fluticasone (FLOVENT HFA) 110 MCG/ACT inhaler Inhale 2 puffs into the lungs 2 (two) times daily.  Marland Kitchen gabapentin (NEURONTIN) 300 MG capsule TAKE 1 CAPSULE(300 MG) BY MOUTH THREE TIMES DAILY (Patient taking differently: Take 300 mg by mouth 3 (  three) times daily. TAKE 1 CAPSULE(300 MG) BY MOUTH THREE TIMES DAILY)  . hydrALAZINE (APRESOLINE) 100 MG tablet TAKE 1 TABLET BY MOUTH THREE TIMES DAILY (Patient taking differently: Take 100 mg by mouth 3 (three) times daily. )  . hyoscyamine (LEVSIN SL) 0.125 MG SL tablet Place 1-2 tablets (0.125-0.25 mg total) under the tongue every 8 (eight) hours as needed (lower abdominal pain, spasm). (Patient taking differently: Place 0.125-0.25 mg under the tongue every 8 (eight) hours as needed for cramping (lower abdominal pain, spasm). )  . ipratropium (ATROVENT) 0.06 % nasal spray USE 2 SPRAYS IN EACH NOSTRIL 2 TO 3 TIMES DAILY AS NEEDED  . lactulose (CHRONULAC) 10 GM/15ML solution Take 20 g by mouth daily as needed for moderate constipation.   Marland Kitchen levothyroxine (SYNTHROID) 25 MCG tablet Take 1 tablet by mouth daily before breakfast (Patient taking differently: Take 25 mcg by mouth daily before breakfast. Take 1 tablet by mouth daily before breakfast)  . lidocaine-prilocaine (EMLA) cream Apply 1 application topically See admin instructions. Apply to access site 1-2 hours before dialysis  . lubiprostone (AMITIZA) 24 MCG capsule TAKE 1 CAPSULE(24 MCG) BY MOUTH TWICE DAILY WITH  A MEAL (Patient taking differently: Take 24 mcg by mouth 2 (two) times daily with a meal. )  . Methoxy PEG-Epoetin Beta (MIRCERA IJ) Inject into the skin.  . mupirocin ointment (BACTROBAN) 2 % Apply 1 application topically daily as needed (apply to port after dialysis).   . Olopatadine HCl (PAZEO) 0.7 % SOLN Place 1 drop into both eyes daily.  . pimecrolimus (ELIDEL) 1 % cream APPLY TO AFFECTED AREA AFTER BATHING  . Spacer/Aero-Holding Chambers DEVI 1 Device by Does not apply route 2 (two) times a day.  . traMADol (ULTRAM) 50 MG tablet Take 1 tablet (50 mg total) by mouth every 6 (six) hours as needed for moderate pain.  . Turmeric 1053 MG TABS Take by mouth.  . Turmeric 500 MG CAPS Take 500 mg by mouth daily.  . VELPHORO 500 MG chewable tablet Chew 500 mg by mouth 3 (three) times daily.  . Vitamin D, Ergocalciferol, (DRISDOL) 1.25 MG (50000 UNIT) CAPS capsule TAKE 1 CAPSULE BY MOUTH EVERY 7 DAYS (Patient taking differently: Take 50,000 Units by mouth every Tuesday. )  . vitamin E (VITAMIN E) 180 MG (400 UNITS) capsule Take 400 Units by mouth daily.   Marland Kitchen zinc gluconate 50 MG tablet Take 50 mg by mouth at bedtime.      Allergies:   Ace inhibitors and Lisinopril   Social History   Socioeconomic History  . Marital status: Single    Spouse name: Not on file  . Number of children: Not on file  . Years of education: Not on file  . Highest education level: Not on file  Occupational History  . Not on file  Tobacco Use  . Smoking status: Former Smoker    Types: Cigarettes    Quit date: 2000    Years since quitting: 21.9  . Smokeless tobacco: Never Used  Vaping Use  . Vaping Use: Never used  Substance and Sexual Activity  . Alcohol use: Yes    Comment: once a month   . Drug use: No  . Sexual activity: Not Currently    Partners: Male    Birth control/protection: Condom    Comment: declined  condoms 07/11/20  Other Topics Concern  . Not on file  Social History Narrative  . Not on file    Social Determinants of Health  Financial Resource Strain:   . Difficulty of Paying Living Expenses: Not on file  Food Insecurity: No Food Insecurity  . Worried About Charity fundraiser in the Last Year: Never true  . Ran Out of Food in the Last Year: Never true  Transportation Needs: No Transportation Needs  . Lack of Transportation (Medical): No  . Lack of Transportation (Non-Medical): No  Physical Activity:   . Days of Exercise per Week: Not on file  . Minutes of Exercise per Session: Not on file  Stress:   . Feeling of Stress : Not on file  Social Connections:   . Frequency of Communication with Friends and Family: Not on file  . Frequency of Social Gatherings with Friends and Family: Not on file  . Attends Religious Services: Not on file  . Active Member of Clubs or Organizations: Not on file  . Attends Archivist Meetings: Not on file  . Marital Status: Not on file     Family History: The patient's family history includes Heart attack (age of onset: 73) in his maternal grandmother; Heart failure in his mother; Hypertension in his brother, sister, and sister; Multiple sclerosis in his sister; Scoliosis in an other family member. There is no history of Allergic rhinitis, Angioedema, Asthma, Eczema, Immunodeficiency, or Urticaria.  ROS:   Please see the history of present illness.     All other systems reviewed and are negative.  EKGs/Labs/Other Studies Reviewed:    The following studies were reviewed today:  Myoview 01/31/2020  Nuclear stress EF: 51%. The left ventricular ejection fraction is mildly decreased (45-54%). The LV appears to be mildly dilated.  There was no ST segment deviation noted during stress.  This is a low risk study. There is no evidence of ischemia or previous infarction.  The study is normal.   TEE 09/03/2020 1. Left ventricular ejection fraction, by estimation, is 50 to 55%. The  left ventricle has normal function. The left  ventricle has no regional  wall motion abnormalities.  2. Right ventricular systolic function is normal. The right ventricular  size is normal.  3. Left atrial size was mildly dilated..  4. MV annulus measures 37 mm. The mitral leaflets appear normal but  coaptation not complete There are multiple jets of MR, the most prominent  is posteriorly directed, wrapping around posterior aspect of LA Appears  moderate to moderately severe  (Carpentier Type I). The mitral valve is normal in structure. Moderate to  severe mitral valve regurgitation.  5. The aortic valve is normal in structure. Aortic valve regurgitation is  mild.  6. Agitated saline contrast bubble study was negative, with no evidence  of any interatrial shunt.    EKG:  EKG is ordered today.  The ekg ordered today demonstrates normal sinus rhythm, no significant ST-T wave changes.  Recent Labs: 05/01/2020: TSH 2.290 08/30/2020: ALT 15; B Natriuretic Peptide 1,331.1; Magnesium 1.8 09/04/2020: Hemoglobin 9.2; Platelets 217 09/28/2020: BUN 49; Creatinine, Ser 8.18; Potassium 4.2; Sodium 138  Recent Lipid Panel    Component Value Date/Time   CHOL 180 06/27/2020 0920   CHOL 180 12/09/2017 0936   TRIG 183 (H) 06/27/2020 0920   HDL 37 (L) 06/27/2020 0920   HDL 34 (L) 12/09/2017 0936   CHOLHDL 4.9 06/27/2020 0920   VLDL 29 05/25/2017 0953   LDLCALC 113 (H) 06/27/2020 0920     Risk Assessment/Calculations:       Physical Exam:    VS:  BP (!) 144/92  Pulse 79   Ht 5' 10"  (1.778 m)   Wt 198 lb 12.8 oz (90.2 kg)   SpO2 98%   BMI 28.52 kg/m     Wt Readings from Last 3 Encounters:  09/28/20 198 lb 12.8 oz (90.2 kg)  09/04/20 187 lb 13.3 oz (85.2 kg)  08/14/20 206 lb (93.4 kg)     GEN:  Well nourished, well developed in no acute distress HEENT: Normal NECK: No JVD; No carotid bruits LYMPHATICS: No lymphadenopathy CARDIAC: RRR, no murmurs, rubs, gallops RESPIRATORY:  Clear to auscultation without rales, wheezing  or rhonchi  ABDOMEN: Soft, non-tender, non-distended MUSCULOSKELETAL:  No edema; No deformity  SKIN: Warm and dry NEUROLOGIC:  Alert and oriented x 3 PSYCHIATRIC:  Normal affect   ASSESSMENT:    1. Chest pain of uncertain etiology   2. Hypokalemia   3. ESRD (end stage renal disease) ([place])   4. Chronic diastolic CHF (congestive heart failure) ([place])   5. Mitral valve insufficiency, unspecified etiology    PLAN:    In order of problems listed above:  1. Chest pain: Patient had different type of chest pain earlier this year when he saw Dr. Burt Eaton, Myoview ordered in April was negative for ischemia.  Recently, he started having recurrent chest discomfort in the substernal area.  He says the symptoms occur both at rest and with exertion and may last from several minutes to several hours.  Symptom sounds somewhat atypical and is not exacerbated by deep inspiration, body rotation on palpation.  Recent CT scan showed calcification in the LAD territory.  I discussed the case with Albert Eaton, since he has already progressed to end-stage renal disease, we will proceed with coronary CT to reevaluate his chest pain.  We will attempt to obtain coronary CT on a nondialysis days to allow better rate control.  2. Hypokalemia: Obtain basic metabolic panel  3. End-stage renal disease: On hemodialysis, Monday Tuesday Thursday and Friday.Marland Kitchen  He gets dialysis 4 times a week.  4. Chronic diastolic heart failure: No signs of heart failure symptoms.  Volume managed by dialysis.  5. Moderate to severe MR: Noted on recent echocardiogram, confirmed on TEE as well.  Will need outpatient evaluation by either Dr. Burt Eaton or valve clinic.   Shared Decision Making/Informed Consent   { Medication Adjustments/Labs and Tests Ordered: Current medicines are reviewed at length with the patient today.  Concerns regarding medicines are outlined above.  Orders Placed This Encounter  Procedures  . CT CORONARY MORPH W/CTA  COR W/SCORE W/CA W/CM &/OR WO/CM  . CT CORONARY FRACTIONAL FLOW RESERVE DATA PREP  . CT CORONARY FRACTIONAL FLOW RESERVE FLUID ANALYSIS  . Basic metabolic panel  . EKG 12-Lead   No orders of the defined types were placed in this encounter.   Patient Instructions  Medication Instructions:  Your physician recommends that you continue on your current medications as directed. Please refer to the Current Medication list given to you today.  *If you need a refill on your cardiac medications before your next appointment, please call your pharmacy*   Lab Work: Your physician recommends that you return for lab work TODAY:   BMET  If you have labs (blood work) drawn today and your tests are completely normal, you will receive your results only by: Marland Kitchen MyChart Message (if you have MyChart) OR . A paper copy in the mail If you have any lab test that is abnormal or we need to change your treatment, we will call  you to review the results.  Testing/Procedures: Your cardiac CT will be scheduled at one of the below locations:   [facility] [address] [place], [place] [address] [phone]  [place] [address] [place], [place] [address] [phone]  If scheduled at [facility], please arrive at the [facility] main entrance of [facility] 30 minutes prior to test start time. Proceed to the [facility] Radiology Department (first floor) to check-in and test prep.  If scheduled at [facility], please arrive 15 mins early for check-in and test prep.  Please follow these instructions carefully (unless otherwise directed):  Hold all erectile dysfunction medications at least 3 days (72 hrs) prior to test.  On the Night Before the Test: . Be sure to Drink plenty of water. . Do not consume any caffeinated/decaffeinated beverages or chocolate 12 hours prior to your test. . Do  not take any antihistamines 12 hours prior to your test. . If the patient has contrast allergy: ? Patient will need a prescription for Prednisone and very clear instructions (as follows): 1. Prednisone 50 mg - take 13 hours prior to test 2. Take another Prednisone 50 mg 7 hours prior to test 3. Take another Prednisone 50 mg 1 hour prior to test 4. Take Benadryl 50 mg 1 hour prior to test . Patient must complete all four doses of above prophylactic medications. . Patient will need a ride after test due to Benadryl.  On the Day of the Test: . Drink plenty of water. Do not drink any water within one hour of the test. . Do not eat any food 4 hours prior to the test. . You may take your regular medications prior to the test.  . Take metoprolol (Lopressor) two hours prior to test. . HOLD Furosemide/Hydrochlorothiazide morning of the test.    *For Clinical Staff only. Please instruct patient the following:*        -Drink plenty of water       -Hold Furosemide/hydrochlorothiazide morning of the test       -Take metoprolol (Lopressor) 2 hours prior to test (if applicable).                  -If HR is less than 55 BPM- No Lopressor                -IF HR is greater than 55 BPM and patient is less than or equal to 74 yrs old Lopressor 159m x1.                -If HR is greater than 55 BPM and patient is greater than 72yrs old Lopressor 50 mg x1.     Do not give Lopressor to patients with an allergy to lopressor or anyone with asthma or active COPD symptoms (currently taking steroids).       After the Test: . Drink plenty of water. . After receiving IV contrast, you may experience a mild flushed feeling. This is normal. . On occasion, you may experience a mild rash up to 24 hours after the test. This is not dangerous. If this occurs, you can take Benadryl 25 mg and increase your fluid intake. . If you experience trouble breathing, this can be serious. If it is severe call 911 IMMEDIATELY. If  it is mild, please call our office. . If you take any of these medications: Glipizide/Metformin, Avandament, Glucavance, please do  not take 48 hours after completing test unless otherwise instructed.   Once we have confirmed authorization from your insurance company, we will call you to set up a date and time for your test. Based on how quickly your insurance processes prior authorizations requests, please allow up to 4 weeks to be contacted for scheduling your Cardiac CT appointment. Be advised that routine Cardiac CT appointments could be scheduled as many as 8 weeks after your provider has ordered it.  For non-scheduling related questions, please contact the cardiac imaging nurse navigator should you have any questions/concerns: Marchia Bond, Cardiac Imaging Nurse Navigator Burley Saver, Interim Cardiac Imaging Nurse [facility] Direct Office Dial: [phone]   For scheduling needs, including cancellations and rescheduling, please call [place], [phone].   Follow-Up: At [facility], you and your health needs are our priority.  As part of our continuing mission to provide you with exceptional heart care, we have created designated Provider Care Teams.  These Care Teams include your primary Cardiologist (physician) and Advanced Practice Providers (APPs -  Physician Assistants and Nurse Practitioners) who all work together to provide you with the care you need, when you need it.  Your next appointment:   Follow up as scheduled    The format for your next appointment:   In Person  Provider:   Truitt Merle, NP  Other Instructions      Signed, Almyra Deforest, [place]  09/30/2020 6:24 PM    [place]

## 2020-09-30 ENCOUNTER — Encounter: Payer: Self-pay | Admitting: Physician Assistant

## 2020-10-01 DIAGNOSIS — E8770 Fluid overload, unspecified: Secondary | ICD-10-CM | POA: Diagnosis not present

## 2020-10-01 DIAGNOSIS — D509 Iron deficiency anemia, unspecified: Secondary | ICD-10-CM | POA: Diagnosis not present

## 2020-10-01 DIAGNOSIS — E440 Moderate protein-calorie malnutrition: Secondary | ICD-10-CM | POA: Diagnosis not present

## 2020-10-01 DIAGNOSIS — Z4931 Encounter for adequacy testing for hemodialysis: Secondary | ICD-10-CM | POA: Diagnosis not present

## 2020-10-01 DIAGNOSIS — N2581 Secondary hyperparathyroidism of renal origin: Secondary | ICD-10-CM | POA: Diagnosis not present

## 2020-10-01 DIAGNOSIS — Z79899 Other long term (current) drug therapy: Secondary | ICD-10-CM | POA: Diagnosis not present

## 2020-10-01 DIAGNOSIS — E871 Hypo-osmolality and hyponatremia: Secondary | ICD-10-CM | POA: Diagnosis not present

## 2020-10-01 DIAGNOSIS — D631 Anemia in chronic kidney disease: Secondary | ICD-10-CM | POA: Diagnosis not present

## 2020-10-01 DIAGNOSIS — R82998 Other abnormal findings in urine: Secondary | ICD-10-CM | POA: Diagnosis not present

## 2020-10-01 DIAGNOSIS — E876 Hypokalemia: Secondary | ICD-10-CM | POA: Diagnosis not present

## 2020-10-01 DIAGNOSIS — N186 End stage renal disease: Secondary | ICD-10-CM | POA: Diagnosis not present

## 2020-10-01 DIAGNOSIS — Z992 Dependence on renal dialysis: Secondary | ICD-10-CM | POA: Diagnosis not present

## 2020-10-02 ENCOUNTER — Telehealth: Payer: Self-pay

## 2020-10-02 DIAGNOSIS — E8770 Fluid overload, unspecified: Secondary | ICD-10-CM | POA: Diagnosis not present

## 2020-10-02 DIAGNOSIS — D631 Anemia in chronic kidney disease: Secondary | ICD-10-CM | POA: Diagnosis not present

## 2020-10-02 DIAGNOSIS — Z4931 Encounter for adequacy testing for hemodialysis: Secondary | ICD-10-CM | POA: Diagnosis not present

## 2020-10-02 DIAGNOSIS — N186 End stage renal disease: Secondary | ICD-10-CM | POA: Diagnosis not present

## 2020-10-02 DIAGNOSIS — Z79899 Other long term (current) drug therapy: Secondary | ICD-10-CM | POA: Diagnosis not present

## 2020-10-02 DIAGNOSIS — R82998 Other abnormal findings in urine: Secondary | ICD-10-CM | POA: Diagnosis not present

## 2020-10-02 DIAGNOSIS — E871 Hypo-osmolality and hyponatremia: Secondary | ICD-10-CM | POA: Diagnosis not present

## 2020-10-02 DIAGNOSIS — E876 Hypokalemia: Secondary | ICD-10-CM | POA: Diagnosis not present

## 2020-10-02 DIAGNOSIS — N2581 Secondary hyperparathyroidism of renal origin: Secondary | ICD-10-CM | POA: Diagnosis not present

## 2020-10-02 DIAGNOSIS — D509 Iron deficiency anemia, unspecified: Secondary | ICD-10-CM | POA: Diagnosis not present

## 2020-10-02 DIAGNOSIS — Z992 Dependence on renal dialysis: Secondary | ICD-10-CM | POA: Diagnosis not present

## 2020-10-02 DIAGNOSIS — E440 Moderate protein-calorie malnutrition: Secondary | ICD-10-CM | POA: Diagnosis not present

## 2020-10-02 NOTE — Telephone Encounter (Signed)
Phone call to patient per Dr. Onalee Hua request regarding his Protopic being denied.  Patient states he will see what the cash pay price for the Protopic is and if it's too expensive then he will give [place] a call back so we can send in a prescription for the Tretinoin cream.

## 2020-10-02 NOTE — Telephone Encounter (Signed)
-----   Message from Lavonna Monarch, MD sent at 10/02/2020 11:33 AM EST ----- Regarding: med change Please send inform that his insurance arbitrarily decided that Protopic (tacrolimus ointment) would not be covered.  As discussed, there is no FDA approved medicine to treat acanthosis nigricans.  The 2 neck step options would be for him to check on cash.  Pay price for generic tacrolimus ointment or to see if 0.025% tretinoin cream is approved.  If this is approved, he should start using it every other night and there is a chance that it will be too irritating to use.  Unfortunately I do not have alternatives to these 2 options.

## 2020-10-03 ENCOUNTER — Telehealth (HOSPITAL_COMMUNITY): Payer: Self-pay | Admitting: Emergency Medicine

## 2020-10-03 DIAGNOSIS — N186 End stage renal disease: Secondary | ICD-10-CM | POA: Diagnosis not present

## 2020-10-03 DIAGNOSIS — Z79899 Other long term (current) drug therapy: Secondary | ICD-10-CM | POA: Diagnosis not present

## 2020-10-03 DIAGNOSIS — Z992 Dependence on renal dialysis: Secondary | ICD-10-CM | POA: Diagnosis not present

## 2020-10-03 DIAGNOSIS — D509 Iron deficiency anemia, unspecified: Secondary | ICD-10-CM | POA: Diagnosis not present

## 2020-10-03 DIAGNOSIS — E8770 Fluid overload, unspecified: Secondary | ICD-10-CM | POA: Diagnosis not present

## 2020-10-03 DIAGNOSIS — R82998 Other abnormal findings in urine: Secondary | ICD-10-CM | POA: Diagnosis not present

## 2020-10-03 DIAGNOSIS — N2581 Secondary hyperparathyroidism of renal origin: Secondary | ICD-10-CM | POA: Diagnosis not present

## 2020-10-03 DIAGNOSIS — D631 Anemia in chronic kidney disease: Secondary | ICD-10-CM | POA: Diagnosis not present

## 2020-10-03 DIAGNOSIS — E876 Hypokalemia: Secondary | ICD-10-CM | POA: Diagnosis not present

## 2020-10-03 DIAGNOSIS — E440 Moderate protein-calorie malnutrition: Secondary | ICD-10-CM | POA: Diagnosis not present

## 2020-10-03 DIAGNOSIS — Z4931 Encounter for adequacy testing for hemodialysis: Secondary | ICD-10-CM | POA: Diagnosis not present

## 2020-10-03 DIAGNOSIS — E871 Hypo-osmolality and hyponatremia: Secondary | ICD-10-CM | POA: Diagnosis not present

## 2020-10-03 NOTE — Telephone Encounter (Signed)
Telephone call to patient to clarify which days he receives dialysis to see if his CCTA appt would need rescheduled.  Albert Eaton mentioned scanning him on a non-dialysis day.   Patient receives dialysis on M,Tu,Th,Fr. Therefore the only day to scan is wednesdsay. So I rescheduled his CCTA for Wednesday 10/10/20 at 7:45a.  Pt verbalized understanding and appreciated the call  I will call him again Friday to review CCTA instructions.  Marchia Bond RN Navigator Cardiac Imaging [facility] [phone] Office  [phone] Cell

## 2020-10-04 ENCOUNTER — Other Ambulatory Visit (HOSPITAL_COMMUNITY): Payer: Self-pay | Admitting: Emergency Medicine

## 2020-10-04 DIAGNOSIS — D631 Anemia in chronic kidney disease: Secondary | ICD-10-CM | POA: Diagnosis not present

## 2020-10-04 DIAGNOSIS — R82998 Other abnormal findings in urine: Secondary | ICD-10-CM | POA: Diagnosis not present

## 2020-10-04 DIAGNOSIS — E8770 Fluid overload, unspecified: Secondary | ICD-10-CM | POA: Diagnosis not present

## 2020-10-04 DIAGNOSIS — N2581 Secondary hyperparathyroidism of renal origin: Secondary | ICD-10-CM | POA: Diagnosis not present

## 2020-10-04 DIAGNOSIS — Z79899 Other long term (current) drug therapy: Secondary | ICD-10-CM | POA: Diagnosis not present

## 2020-10-04 DIAGNOSIS — Z992 Dependence on renal dialysis: Secondary | ICD-10-CM | POA: Diagnosis not present

## 2020-10-04 DIAGNOSIS — Z4931 Encounter for adequacy testing for hemodialysis: Secondary | ICD-10-CM | POA: Diagnosis not present

## 2020-10-04 DIAGNOSIS — R079 Chest pain, unspecified: Secondary | ICD-10-CM

## 2020-10-04 DIAGNOSIS — E871 Hypo-osmolality and hyponatremia: Secondary | ICD-10-CM | POA: Diagnosis not present

## 2020-10-04 DIAGNOSIS — E876 Hypokalemia: Secondary | ICD-10-CM | POA: Diagnosis not present

## 2020-10-04 DIAGNOSIS — D509 Iron deficiency anemia, unspecified: Secondary | ICD-10-CM | POA: Diagnosis not present

## 2020-10-04 DIAGNOSIS — N186 End stage renal disease: Secondary | ICD-10-CM | POA: Diagnosis not present

## 2020-10-04 DIAGNOSIS — E440 Moderate protein-calorie malnutrition: Secondary | ICD-10-CM | POA: Diagnosis not present

## 2020-10-04 MED ORDER — METOPROLOL TARTRATE 100 MG PO TABS: 100.0000 mg | ORAL_TABLET | Freq: Once | ORAL | 0 refills | Status: DC

## 2020-10-04 NOTE — Progress Notes (Signed)
Patients resting HR 79-104 with upcoming cardiac CTA. Verbal order from [facility] to give 122m metoprolol tartrate 2 hr prior to scan (HOLD daily carvedilol and amlodipine)  Will call patient to relay information.  SMarchia BondRN Navigator Cardiac Imaging [facility] [phone]  [phone]

## 2020-10-05 ENCOUNTER — Other Ambulatory Visit (HOSPITAL_COMMUNITY): Payer: Self-pay | Admitting: Physician Assistant

## 2020-10-05 DIAGNOSIS — E871 Hypo-osmolality and hyponatremia: Secondary | ICD-10-CM | POA: Diagnosis not present

## 2020-10-05 DIAGNOSIS — E8770 Fluid overload, unspecified: Secondary | ICD-10-CM | POA: Diagnosis not present

## 2020-10-05 DIAGNOSIS — Z4931 Encounter for adequacy testing for hemodialysis: Secondary | ICD-10-CM | POA: Diagnosis not present

## 2020-10-05 DIAGNOSIS — D631 Anemia in chronic kidney disease: Secondary | ICD-10-CM | POA: Diagnosis not present

## 2020-10-05 DIAGNOSIS — Z992 Dependence on renal dialysis: Secondary | ICD-10-CM | POA: Diagnosis not present

## 2020-10-05 DIAGNOSIS — N2581 Secondary hyperparathyroidism of renal origin: Secondary | ICD-10-CM | POA: Diagnosis not present

## 2020-10-05 DIAGNOSIS — R82998 Other abnormal findings in urine: Secondary | ICD-10-CM | POA: Diagnosis not present

## 2020-10-05 DIAGNOSIS — E440 Moderate protein-calorie malnutrition: Secondary | ICD-10-CM | POA: Diagnosis not present

## 2020-10-05 DIAGNOSIS — E876 Hypokalemia: Secondary | ICD-10-CM | POA: Diagnosis not present

## 2020-10-05 DIAGNOSIS — Z79899 Other long term (current) drug therapy: Secondary | ICD-10-CM | POA: Diagnosis not present

## 2020-10-05 DIAGNOSIS — D509 Iron deficiency anemia, unspecified: Secondary | ICD-10-CM | POA: Diagnosis not present

## 2020-10-05 DIAGNOSIS — R079 Chest pain, unspecified: Secondary | ICD-10-CM

## 2020-10-05 DIAGNOSIS — N186 End stage renal disease: Secondary | ICD-10-CM | POA: Diagnosis not present

## 2020-10-08 ENCOUNTER — Telehealth (HOSPITAL_COMMUNITY): Payer: Self-pay | Admitting: Emergency Medicine

## 2020-10-08 ENCOUNTER — Ambulatory Visit (HOSPITAL_COMMUNITY): Payer: Medicare Other

## 2020-10-08 DIAGNOSIS — Z4931 Encounter for adequacy testing for hemodialysis: Secondary | ICD-10-CM | POA: Diagnosis not present

## 2020-10-08 DIAGNOSIS — D631 Anemia in chronic kidney disease: Secondary | ICD-10-CM | POA: Diagnosis not present

## 2020-10-08 DIAGNOSIS — Z992 Dependence on renal dialysis: Secondary | ICD-10-CM | POA: Diagnosis not present

## 2020-10-08 DIAGNOSIS — N186 End stage renal disease: Secondary | ICD-10-CM | POA: Diagnosis not present

## 2020-10-08 DIAGNOSIS — Z79899 Other long term (current) drug therapy: Secondary | ICD-10-CM | POA: Diagnosis not present

## 2020-10-08 DIAGNOSIS — N2581 Secondary hyperparathyroidism of renal origin: Secondary | ICD-10-CM | POA: Diagnosis not present

## 2020-10-08 DIAGNOSIS — E871 Hypo-osmolality and hyponatremia: Secondary | ICD-10-CM | POA: Diagnosis not present

## 2020-10-08 DIAGNOSIS — R82998 Other abnormal findings in urine: Secondary | ICD-10-CM | POA: Diagnosis not present

## 2020-10-08 DIAGNOSIS — E876 Hypokalemia: Secondary | ICD-10-CM | POA: Diagnosis not present

## 2020-10-08 DIAGNOSIS — E8770 Fluid overload, unspecified: Secondary | ICD-10-CM | POA: Diagnosis not present

## 2020-10-08 DIAGNOSIS — E440 Moderate protein-calorie malnutrition: Secondary | ICD-10-CM | POA: Diagnosis not present

## 2020-10-08 DIAGNOSIS — D509 Iron deficiency anemia, unspecified: Secondary | ICD-10-CM | POA: Diagnosis not present

## 2020-10-08 NOTE — Telephone Encounter (Signed)
Reaching out to patient to offer assistance regarding upcoming cardiac imaging study; pt verbalizes understanding of appt date/time, parking situation and where to check in, pre-test NPO status and medications ordered, and verified current allergies; name and call back number provided for further questions should they arise Marchia Bond RN Navigator Cardiac Imaging [place] [facility] V[facility] [phone] office [phone] cell   Pt reminded to pick up one time dose metoprolol tartrate to take 2 hr prior to scan; will hold daily carvedilol and amlodipine.  pts dialysis access is on the LEFT, therefore only sticks/BPs to be done on the RIGHT (denies difficulties with IV starts)  Clarise Cruz

## 2020-10-09 DIAGNOSIS — D509 Iron deficiency anemia, unspecified: Secondary | ICD-10-CM | POA: Diagnosis not present

## 2020-10-09 DIAGNOSIS — N186 End stage renal disease: Secondary | ICD-10-CM | POA: Diagnosis not present

## 2020-10-09 DIAGNOSIS — E8770 Fluid overload, unspecified: Secondary | ICD-10-CM | POA: Diagnosis not present

## 2020-10-09 DIAGNOSIS — E876 Hypokalemia: Secondary | ICD-10-CM | POA: Diagnosis not present

## 2020-10-09 DIAGNOSIS — E440 Moderate protein-calorie malnutrition: Secondary | ICD-10-CM | POA: Diagnosis not present

## 2020-10-09 DIAGNOSIS — Z992 Dependence on renal dialysis: Secondary | ICD-10-CM | POA: Diagnosis not present

## 2020-10-09 DIAGNOSIS — E871 Hypo-osmolality and hyponatremia: Secondary | ICD-10-CM | POA: Diagnosis not present

## 2020-10-09 DIAGNOSIS — Z79899 Other long term (current) drug therapy: Secondary | ICD-10-CM | POA: Diagnosis not present

## 2020-10-09 DIAGNOSIS — N2581 Secondary hyperparathyroidism of renal origin: Secondary | ICD-10-CM | POA: Diagnosis not present

## 2020-10-09 DIAGNOSIS — R82998 Other abnormal findings in urine: Secondary | ICD-10-CM | POA: Diagnosis not present

## 2020-10-09 DIAGNOSIS — D631 Anemia in chronic kidney disease: Secondary | ICD-10-CM | POA: Diagnosis not present

## 2020-10-09 DIAGNOSIS — Z4931 Encounter for adequacy testing for hemodialysis: Secondary | ICD-10-CM | POA: Diagnosis not present

## 2020-10-10 ENCOUNTER — Ambulatory Visit (HOSPITAL_COMMUNITY)
Admission: RE | Admit: 2020-10-10 | Discharge: 2020-10-10 | Disposition: A | Discharge status: Home / Self Care | Payer: Medicare Other | Source: Ambulatory Visit | Attending: Physician Assistant | Admitting: Physician Assistant

## 2020-10-10 ENCOUNTER — Other Ambulatory Visit: Payer: Self-pay

## 2020-10-10 ENCOUNTER — Encounter: Payer: Medicare Other | Admitting: *Deleted

## 2020-10-10 DIAGNOSIS — R079 Chest pain, unspecified: Secondary | ICD-10-CM | POA: Diagnosis not present

## 2020-10-10 DIAGNOSIS — Z006 Encounter for examination for normal comparison and control in clinical research program: Secondary | ICD-10-CM

## 2020-10-10 DIAGNOSIS — I2510 Atherosclerotic heart disease of native coronary artery without angina pectoris: Secondary | ICD-10-CM | POA: Diagnosis not present

## 2020-10-10 IMAGING — CT CT HEART MORP W/ CTA COR W/ SCORE W/ CA W/CM &/OR W/O CM
3 of 7 series · 8 of 20 positions shown, 9 images · non-contrast
Comparison: [DATE].
COMPARISON: [DATE].

Addendum:
EXAM:
OVER-READ INTERPRETATION  CT CHEST

The following report is an over-read performed by radiologist Dr.
SHI [REDACTED] on [DATE]. This
over-read does not include interpretation of cardiac or coronary
anatomy or pathology. The coronary calcium score/coronary CTA
interpretation by the cardiologist is attached.
TECHNIQUE: The patient was scanned on a Phillips Force scanner.

[Series 6: best diast · axial · 0.42mm/px · z∈[-126,-86]mm · 2 of 303 slices shown]
[im 101/303  vessel]
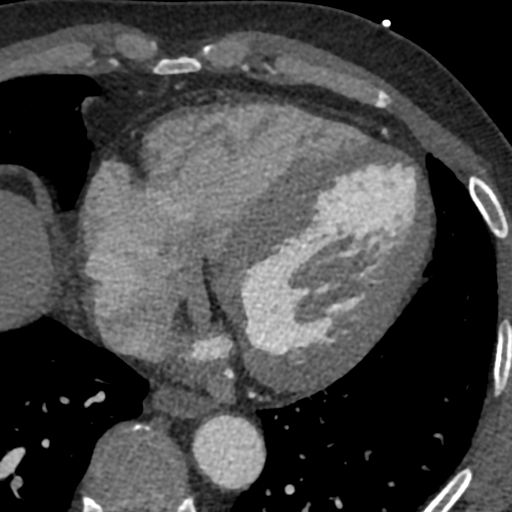
[im 202/303  vessel]
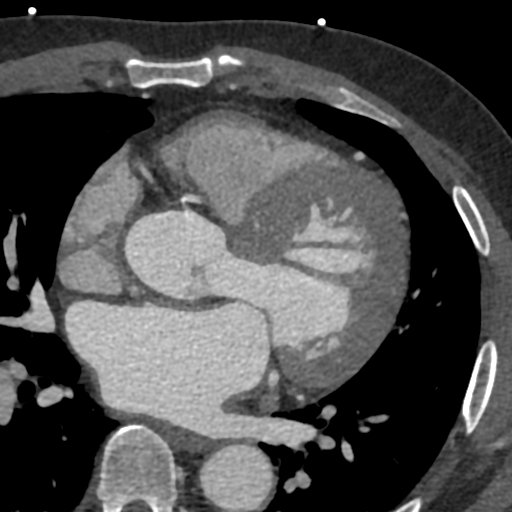

[Series 7: best syst · axial · 0.42mm/px · z∈[-136,-76]mm · 3 of 303 slices shown, 4 images]
[im 76/303  vessel]
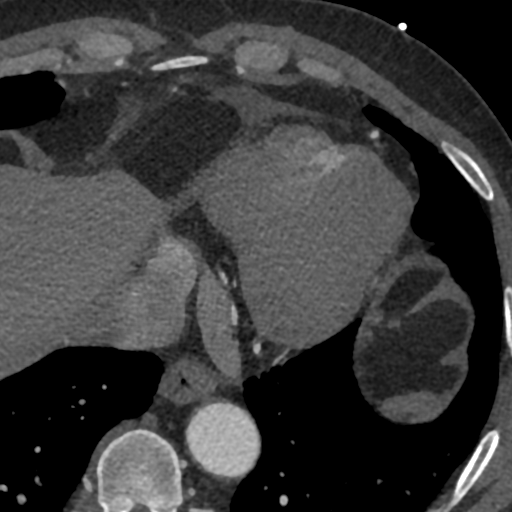
[im 76/303  lung]
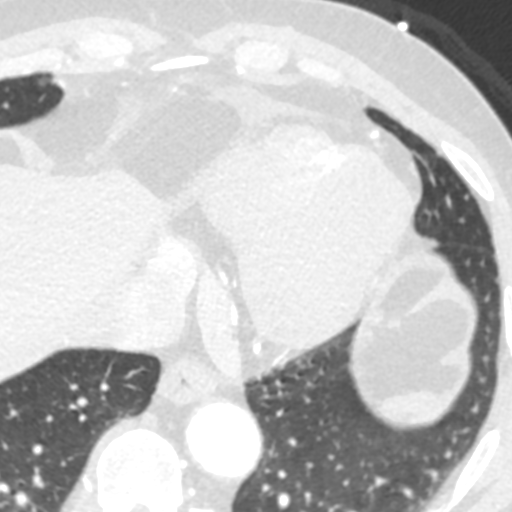
[im 152/303  vessel]
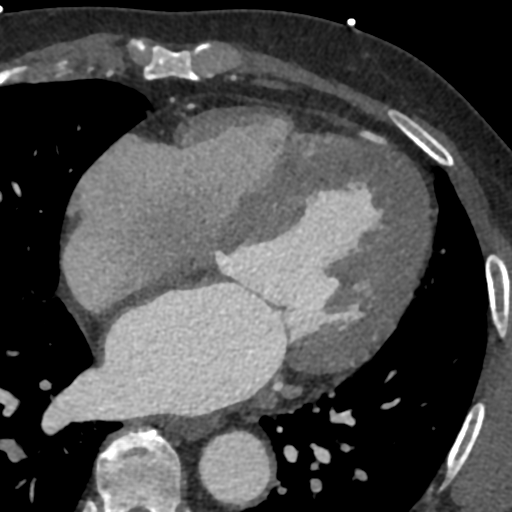
[im 227/303  vessel]
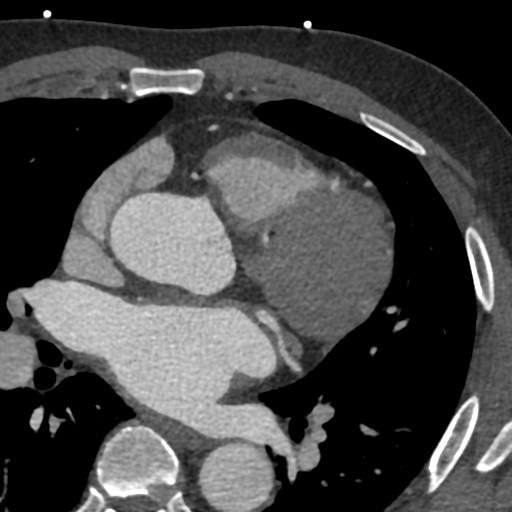

[Series 8: ts diast sharp 75 % · axial · 0.42mm/px · z∈[-136,-76]mm · 3 of 303 slices shown]
[im 76/303  lung]
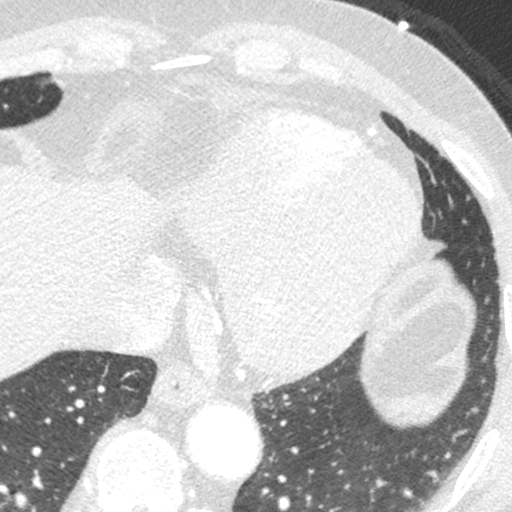
[im 152/303  lung]
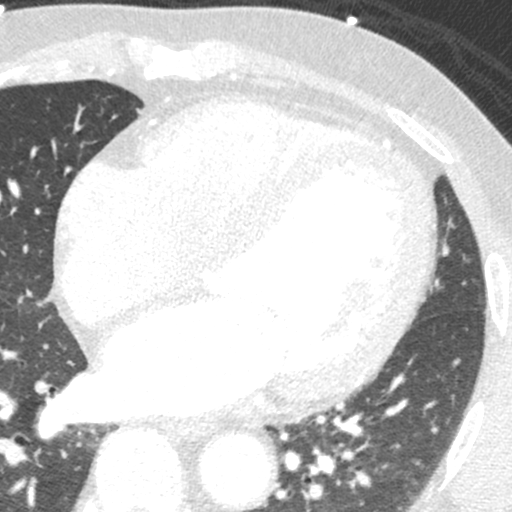
[im 227/303  lung]
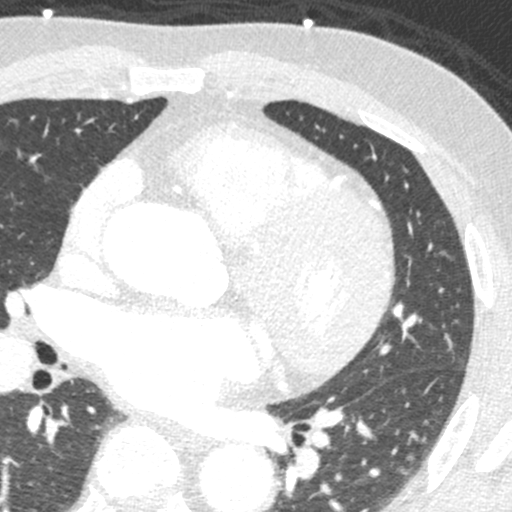

[8 of 20 positions shown; findings below may reference images not displayed]

FINDINGS: Vascular: Pulmonic trunk and heart are enlarged. No pericardial
effusion.

Mediastinum/Nodes: No pathologically enlarged lymph nodes. Distal
esophagus is unremarkable.

Lungs/Pleura: Minimal dependent atelectasis bilaterally. Lungs are
otherwise clear.

Upper Abdomen: Visualized portions of the liver, spleen and stomach
are grossly unremarkable.

Musculoskeletal: None.
IMPRESSION: 1. No acute extracardiac findings.
2. Enlarged pulmonic trunk, indicative of pulmonary arterial
hypertension.

EXAM:
Cardiac/Coronary  CT
FINDINGS: A 120 kV prospective scan was triggered in the descending thoracic
aorta at 111 HU's. Axial non-contrast 3 mm slices were carried out
through the heart. The data set was analyzed on a dedicated work
station and scored using the Agatson method. Gantry rotation speed
was 250 msecs and collimation was .6 mm. No beta blockade and 0.8 mg
of sl NTG was given. The 3D data set was reconstructed in 5%
intervals of the 67-82 % of the R-R cycle. Diastolic phases were
analyzed on a dedicated work station using MPR, MIP and VRT modes.
The patient received 80 cc of contrast.

Aorta: Mildly dilated ascending aorta at 38mm. No calcifications. No
dissection.

Aortic Valve:  Trileaflet.  No calcifications.

Coronary Arteries:  Normal coronary origin.  Right dominance.

RCA is a small non dominant artery that gives rise to PDA and PLVB.
There is mild calcified plaque in the proximal RCA with associated
stenosis of 25-49%. There is significant noise artifact prohibiting
accurate assessment for non calcified plaque.

Left main is a large artery that gives rise to LAD and LCX arteries.
There is minimal calcified plaque in the proximal and distal LM with
associated stenosis of 1-24%.

LAD is a large vessel that gives rise to 3 moderate to large sized
diagonals. There is mild mixed plaque in the ostial and proximal LAD
with associated stenosis of 25-49% and scattered mild calcified
plaque in the mid LAD with associated stenosis of 25-49%.

LCX is a dominant artery that gives rise to one large OM1 branch and
then a moderate sized LPLA and LPDA. There is mild calcified plaque
in the proximal OM1 and proximal PDA with associated stenosis of
25-49%.

Other findings:

Normal pulmonary vein drainage into the left atrium.

Normal let atrial appendage without a thrombus.

Normal size of the pulmonary artery.
IMPRESSION: 1. Coronary calcium score of 437. This was 98th percentile for age
and sex matched control.

2.  Normal coronary origin with left dominance.

3.  Mild atherosclerosis.  CAD RADS 2.

4. Recommend preventive pharmacotherapy and risk factor
modification.

5.  This study has been sent for FFR flow analysis.

SHI

*** End of Addendum ***
EXAM:
OVER-READ INTERPRETATION  CT CHEST

The following report is an over-read performed by radiologist Dr.
SHI [REDACTED] on [DATE]. This
over-read does not include interpretation of cardiac or coronary
anatomy or pathology. The coronary calcium score/coronary CTA
interpretation by the cardiologist is attached.
FINDINGS: Vascular: Pulmonic trunk and heart are enlarged. No pericardial
effusion.

Mediastinum/Nodes: No pathologically enlarged lymph nodes. Distal
esophagus is unremarkable.

Lungs/Pleura: Minimal dependent atelectasis bilaterally. Lungs are
otherwise clear.

Upper Abdomen: Visualized portions of the liver, spleen and stomach
are grossly unremarkable.

Musculoskeletal: None.
IMPRESSION: 1. No acute extracardiac findings.
2. Enlarged pulmonic trunk, indicative of pulmonary arterial
hypertension.

## 2020-10-10 MED ORDER — METOPROLOL TARTRATE 5 MG/5ML IV SOLN
INTRAVENOUS | Status: AC
  Filled 2020-10-10: qty 15

## 2020-10-10 MED ORDER — NITROGLYCERIN 0.4 MG SL SUBL
0.8000 mg | SUBLINGUAL_TABLET | Freq: Once | SUBLINGUAL | Status: AC
  Administered 2020-10-10: 09:00:00 0.8 mg via SUBLINGUAL

## 2020-10-10 MED ORDER — METOPROLOL TARTRATE 5 MG/5ML IV SOLN
5.0000 mg | INTRAVENOUS | Status: DC | PRN
  Administered 2020-10-10 (×2): 5 mg via INTRAVENOUS

## 2020-10-10 MED ORDER — NITROGLYCERIN 0.4 MG SL SUBL
SUBLINGUAL_TABLET | SUBLINGUAL | Status: AC
  Filled 2020-10-10: qty 2

## 2020-10-10 MED ORDER — IOHEXOL 350 MG/ML SOLN
80.0000 mL | Freq: Once | INTRAVENOUS | Status: AC | PRN
  Administered 2020-10-10: 09:00:00 80 mL via INTRAVENOUS

## 2020-10-10 NOTE — Research (Signed)
Subject Name: Albert Eaton  Subject met inclusion and exclusion criteria.  The informed consent form, study requirements and expectations were reviewed with the subject and questions and concerns were addressed prior to the signing of the consent form.  The subject verbalized understanding of the trial requirements.  The subject agreed to participate in the IDENTIFY trial and signed the informed consent at 0711 on 10/10/20  The informed consent was obtained prior to performance of any protocol-specific procedures for the subject.  A copy of the signed informed consent was given to the subject and a copy was placed in the subject's medical record.   [name]

## 2020-10-11 ENCOUNTER — Ambulatory Visit (HOSPITAL_COMMUNITY)
Admission: RE | Admit: 2020-10-11 | Discharge: 2020-10-11 | Disposition: A | Discharge status: Home / Self Care | Payer: Medicare Other | Source: Ambulatory Visit | Attending: Physician Assistant | Admitting: Physician Assistant

## 2020-10-11 DIAGNOSIS — R079 Chest pain, unspecified: Secondary | ICD-10-CM | POA: Diagnosis not present

## 2020-10-11 DIAGNOSIS — E440 Moderate protein-calorie malnutrition: Secondary | ICD-10-CM | POA: Diagnosis not present

## 2020-10-11 DIAGNOSIS — N2581 Secondary hyperparathyroidism of renal origin: Secondary | ICD-10-CM | POA: Diagnosis not present

## 2020-10-11 DIAGNOSIS — D509 Iron deficiency anemia, unspecified: Secondary | ICD-10-CM | POA: Diagnosis not present

## 2020-10-11 DIAGNOSIS — R82998 Other abnormal findings in urine: Secondary | ICD-10-CM | POA: Diagnosis not present

## 2020-10-11 DIAGNOSIS — D631 Anemia in chronic kidney disease: Secondary | ICD-10-CM | POA: Diagnosis not present

## 2020-10-11 DIAGNOSIS — E8770 Fluid overload, unspecified: Secondary | ICD-10-CM | POA: Diagnosis not present

## 2020-10-11 DIAGNOSIS — E871 Hypo-osmolality and hyponatremia: Secondary | ICD-10-CM | POA: Diagnosis not present

## 2020-10-11 DIAGNOSIS — E876 Hypokalemia: Secondary | ICD-10-CM | POA: Diagnosis not present

## 2020-10-11 DIAGNOSIS — Z79899 Other long term (current) drug therapy: Secondary | ICD-10-CM | POA: Diagnosis not present

## 2020-10-11 DIAGNOSIS — N186 End stage renal disease: Secondary | ICD-10-CM | POA: Diagnosis not present

## 2020-10-11 DIAGNOSIS — Z4931 Encounter for adequacy testing for hemodialysis: Secondary | ICD-10-CM | POA: Diagnosis not present

## 2020-10-11 DIAGNOSIS — Z992 Dependence on renal dialysis: Secondary | ICD-10-CM | POA: Diagnosis not present

## 2020-10-12 DIAGNOSIS — N2581 Secondary hyperparathyroidism of renal origin: Secondary | ICD-10-CM | POA: Diagnosis not present

## 2020-10-12 DIAGNOSIS — E876 Hypokalemia: Secondary | ICD-10-CM | POA: Diagnosis not present

## 2020-10-12 DIAGNOSIS — E8770 Fluid overload, unspecified: Secondary | ICD-10-CM | POA: Diagnosis not present

## 2020-10-12 DIAGNOSIS — E871 Hypo-osmolality and hyponatremia: Secondary | ICD-10-CM | POA: Diagnosis not present

## 2020-10-12 DIAGNOSIS — N186 End stage renal disease: Secondary | ICD-10-CM | POA: Diagnosis not present

## 2020-10-12 DIAGNOSIS — Z79899 Other long term (current) drug therapy: Secondary | ICD-10-CM | POA: Diagnosis not present

## 2020-10-12 DIAGNOSIS — E440 Moderate protein-calorie malnutrition: Secondary | ICD-10-CM | POA: Diagnosis not present

## 2020-10-12 DIAGNOSIS — Z4931 Encounter for adequacy testing for hemodialysis: Secondary | ICD-10-CM | POA: Diagnosis not present

## 2020-10-12 DIAGNOSIS — R82998 Other abnormal findings in urine: Secondary | ICD-10-CM | POA: Diagnosis not present

## 2020-10-12 DIAGNOSIS — D509 Iron deficiency anemia, unspecified: Secondary | ICD-10-CM | POA: Diagnosis not present

## 2020-10-12 DIAGNOSIS — D631 Anemia in chronic kidney disease: Secondary | ICD-10-CM | POA: Diagnosis not present

## 2020-10-12 DIAGNOSIS — Z992 Dependence on renal dialysis: Secondary | ICD-10-CM | POA: Diagnosis not present

## 2020-10-15 ENCOUNTER — Other Ambulatory Visit: Payer: Self-pay | Admitting: Allergy & Immunology

## 2020-10-15 DIAGNOSIS — R82998 Other abnormal findings in urine: Secondary | ICD-10-CM | POA: Diagnosis not present

## 2020-10-15 DIAGNOSIS — Z992 Dependence on renal dialysis: Secondary | ICD-10-CM | POA: Diagnosis not present

## 2020-10-15 DIAGNOSIS — E876 Hypokalemia: Secondary | ICD-10-CM | POA: Diagnosis not present

## 2020-10-15 DIAGNOSIS — Z4931 Encounter for adequacy testing for hemodialysis: Secondary | ICD-10-CM | POA: Diagnosis not present

## 2020-10-15 DIAGNOSIS — D509 Iron deficiency anemia, unspecified: Secondary | ICD-10-CM | POA: Diagnosis not present

## 2020-10-15 DIAGNOSIS — E8770 Fluid overload, unspecified: Secondary | ICD-10-CM | POA: Diagnosis not present

## 2020-10-15 DIAGNOSIS — E440 Moderate protein-calorie malnutrition: Secondary | ICD-10-CM | POA: Diagnosis not present

## 2020-10-15 DIAGNOSIS — Z79899 Other long term (current) drug therapy: Secondary | ICD-10-CM | POA: Diagnosis not present

## 2020-10-15 DIAGNOSIS — D631 Anemia in chronic kidney disease: Secondary | ICD-10-CM | POA: Diagnosis not present

## 2020-10-15 DIAGNOSIS — N2581 Secondary hyperparathyroidism of renal origin: Secondary | ICD-10-CM | POA: Diagnosis not present

## 2020-10-15 DIAGNOSIS — N186 End stage renal disease: Secondary | ICD-10-CM | POA: Diagnosis not present

## 2020-10-15 DIAGNOSIS — E871 Hypo-osmolality and hyponatremia: Secondary | ICD-10-CM | POA: Diagnosis not present

## 2020-10-15 NOTE — Progress Notes (Signed)
CARDIOLOGY OFFICE NOTE  Date:  10/29/2020    Albert Eaton Date of Birth: 1965-11-03 Medical Record #[id number]  PCP:  [name], FNP  Cardiologist:  [name]  Chief Complaint  Patient presents with  . Follow-up    Seen for Dr. [name]    History of Present Illness: Albert Eaton is a 54 y.o. male who presents today for a post hospital visit. Seen for Dr. [name].   He has a history of ESRD on HD, chronic diastolic heart failure, MR, HIV, & obstructive sleep apnea.   He was last seen here by Dr. [name] in [place] of 2021. Was having chest pain at that time. Low risk Myoview. Echo with moderate MR.   Presented in early November with worsening SOB - treated for CHF and pneumonia. COVID negative. Echo with severe MR at that time. TEE with moderate to severe MR - was to be followed as outpatient but continued to have symptoms of chest pain and presented back to the hospital. Low suspicion for ACS - very anxious. Coronary CT was done on non dialysis day.     Comes in today. Here alone. He says he is doing better. His breathing has improved some - but now with cough and congestion that has just developed. He has been vaccinated x 3 for COVID. Notes an occasional wheeze as well. Cough is congested but not productive. His weight is up some - he attributes that to the recent holiday. No fever noted. No swelling. He is trying to get on several transplant lists - he wants to go back to work - previously worked as an Optometrist.  He dialyzes at home 4 times a week.   Past Medical History:  Diagnosis Date  . Anemia   . Aortic atherosclerosis ([place])   . Asthma   . Chronic diastolic CHF (congestive heart failure) ([place]) 01/06/2017   Echo 7/16 [facility] in [place], [name]) Mild AI, mild LAE, mild concentric LVH, EF 55, normal wall motion, mild to moderate MR, mild PI, RVSP 55 // Echo 10/09/16 ([facility]):  Moderate LVH, grade 2 diastolic dysfunction, mild MR, moderate LAE   . CKD (chronic  kidney disease)    per pt, waiting on a kidney transplant in near future.  . Esophagitis   . Gout   . Hepatitis    Hep B  . History of nuclear stress test    a. Nuc study 7/16: no scar or ischemia, EF 42 // b. Nuc study 12/17: EF 48, ?small apical ischemia, Low Risk  . HIV (human immunodeficiency virus infection) ([place])   . HLD (hyperlipidemia)   . Hypertension   . Hypertensive heart disease with CHF (congestive heart failure) ([place]) 10/09/2016  . Mitral regurgitation   . OSA (obstructive sleep apnea)   . Post-operative nausea and vomiting    1 time as a child  . Sleep apnea    uses c-pap  . Wears glasses   . Wears partial dentures     Past Surgical History:  Procedure Laterality Date  . AV FISTULA PLACEMENT Left 07/02/2018   Procedure: Creation of Left arm BRACHIOBASILIC ARTERIOVENOUS  FISTULA;  Surgeon: Marty Heck, MD;  Location: [facility];  Service: Vascular;  Laterality: Left;  . BASCILIC VEIN TRANSPOSITION Left 08/27/2018   Procedure: Left arm BRACHIOBASILIC VEIN TRANSPOSITION SECOND STAGE;  Surgeon: Marty Heck, MD;  Location: [facility];  Service: Vascular;  Laterality: Left;  . BUBBLE STUDY  09/03/2020   Procedure:  BUBBLE STUDY;  Surgeon: Fay Records, MD;  Location: [facility];  Service: Cardiovascular;;  . CHOLECYSTECTOMY    . COLONOSCOPY W/ BIOPSIES AND POLYPECTOMY    . GIVENS CAPSULE STUDY N/A 03/01/2018   Procedure: GIVENS CAPSULE STUDY;  Surgeon: Jerene Bears, MD;  Location: [facility];  Service: Gastroenterology;  Laterality: N/A;  . HERNIA REPAIR     As baby  . MULTIPLE TOOTH EXTRACTIONS    . NOSE SURGERY    . RENAL BIOPSY    . TEE WITHOUT CARDIOVERSION N/A 09/03/2020   Procedure: TRANSESOPHAGEAL ECHOCARDIOGRAM (TEE);  Surgeon: Fay Records, MD;  Location: [facility] ENDOSCOPY;  Service: Cardiovascular;  Laterality: N/A;     Medications: Current Meds  Medication Sig  . albuterol (VENTOLIN HFA) 108 (90 Base) MCG/ACT inhaler INHALE 2 PUFFS INTO THE LUNGS  EVERY 4 HOURS AS NEEDED FOR WHEEZING OR SHORTNESS OF BREATH  . allopurinol (ZYLOPRIM) 100 MG tablet TAKE 1 TABLET(100 MG) BY MOUTH TWICE DAILY  . amLODipine (NORVASC) 10 MG tablet Take 1 tablet (10 mg total) by mouth daily.  . Ascorbic Acid (VITAMIN C WITH ROSE HIPS) 1000 MG tablet Take 1,000 mg by mouth daily.  Marland Kitchen azelastine (ASTELIN) 0.1 % nasal spray Place 2 sprays into both nostrils 2 (two) times daily as needed for rhinitis. Use in each nostril as directed (Patient taking differently: Place 2 sprays into both nostrils 2 (two) times daily as needed for rhinitis.)  . B Complex-C-Zn-Folic Acid (DIALYVITE/ZINC) TABS Take 1 tablet by mouth every evening.   . bictegravir-emtricitabine-tenofovir AF (BIKTARVY) 50-200-25 MG TABS tablet Take 1 tablet by mouth daily.  . calcitRIOL (ROCALTROL) 0.5 MCG capsule Take 0.5 mcg by mouth daily.   . carvedilol (COREG) 25 MG tablet TAKE 1 TABLET BY MOUTH TWICE DAILY WITH FOOD (Patient taking differently: Take 25 mg by mouth 2 (two) times daily with a meal.)  . doxycycline (VIBRA-TABS) 100 MG tablet Take 1 tablet (100 mg total) by mouth 2 (two) times daily.  . famotidine (PEPCID) 20 MG tablet TAKE 1 TABLET(20 MG) BY MOUTH TWICE DAILY (Patient taking differently: Take 20 mg by mouth every evening.)  . fluticasone (FLOVENT HFA) 110 MCG/ACT inhaler Inhale 2 puffs into the lungs 2 (two) times daily.  Marland Kitchen gabapentin (NEURONTIN) 300 MG capsule TAKE 1 CAPSULE(300 MG) BY MOUTH THREE TIMES DAILY  . hydrALAZINE (APRESOLINE) 100 MG tablet Take 1 tablet (100 mg total) by mouth 3 (three) times daily.  . hyoscyamine (LEVSIN SL) 0.125 MG SL tablet Place 1-2 tablets (0.125-0.25 mg total) under the tongue every 8 (eight) hours as needed (lower abdominal pain, spasm). (Patient taking differently: Place 0.125-0.25 mg under the tongue every 8 (eight) hours as needed for cramping (lower abdominal pain, spasm).)  . ipratropium (ATROVENT) 0.06 % nasal spray USE 2 SPRAYS IN EACH NOSTRIL 2 TO 3  TIMES DAILY AS NEEDED  . lactulose (CHRONULAC) 10 GM/15ML solution Take 20 g by mouth daily as needed for moderate constipation.   Marland Kitchen levothyroxine (SYNTHROID) 25 MCG tablet TAKE 1 TABLET BY MOUTH DAILY BEFORE BREAKFAST  . lidocaine-prilocaine (EMLA) cream Apply 1 application topically See admin instructions. Apply to access site 1-2 hours before dialysis  . lubiprostone (AMITIZA) 24 MCG capsule TAKE 1 CAPSULE(24 MCG) BY MOUTH TWICE DAILY WITH A MEAL (Patient taking differently: Take 24 mcg by mouth 2 (two) times daily with a meal.)  . mupirocin ointment (BACTROBAN) 2 % Apply 1 application topically daily as needed (apply to port after dialysis).   Marland Kitchen  Olopatadine HCl (PAZEO) 0.7 % SOLN Place 1 drop into both eyes daily.  Marland Kitchen Spacer/Aero-Holding Chambers DEVI 1 Device by Does not apply route 2 (two) times a day.  . traMADol (ULTRAM) 50 MG tablet Take 1 tablet (50 mg total) by mouth every 6 (six) hours as needed for moderate pain.  . Turmeric 1053 MG TABS Take by mouth.  . Turmeric 500 MG CAPS Take 500 mg by mouth daily.  . VELPHORO 500 MG chewable tablet Chew 500 mg by mouth 3 (three) times daily.  . Vitamin D, Ergocalciferol, (DRISDOL) 1.25 MG (50000 UNIT) CAPS capsule TAKE 1 CAPSULE BY MOUTH EVERY 7 DAYS (Patient taking differently: Take 50,000 Units by mouth every Tuesday.)  . vitamin E (VITAMIN E) 180 MG (400 UNITS) capsule Take 400 Units by mouth daily.   Marland Kitchen zinc gluconate 50 MG tablet Take 50 mg by mouth at bedtime.   . [DISCONTINUED] Methoxy PEG-Epoetin Beta (MIRCERA IJ) Inject into the skin.  . [DISCONTINUED] pimecrolimus (ELIDEL) 1 % cream APPLY TO AFFECTED AREA AFTER BATHING     Allergies: Allergies  Allergen Reactions  . Ace Inhibitors Cough and Other (See Comments)  . Lisinopril Cough    Social History: The patient  reports that he quit smoking about 22 years ago. His smoking use included cigarettes. He has never used smokeless tobacco. He reports current alcohol use. He reports  that he does not use drugs.   Family History: The patient's family history includes Heart attack (age of onset: 67) in his maternal grandmother; Heart failure in his mother; Hypertension in his brother, sister, and sister; Multiple sclerosis in his sister; Scoliosis in an other family member.   Review of Systems: Please see the history of present illness.   All other systems are reviewed and negative.   Physical Exam: VS:  BP 110/70   Pulse 72   Ht 5' 10"  (1.778 m)   Wt 203 lb (92.1 kg)   SpO2 97%   BMI 29.13 kg/m  .  BMI Body mass index is 29.13 kg/m.  Wt Readings from Last 3 Encounters:  10/29/20 203 lb (92.1 kg)  09/28/20 198 lb 12.8 oz (90.2 kg)  09/04/20 187 lb 13.3 oz (85.2 kg)    General: Pleasant. Alert and in no acute distress.   HEENT: Normal.  Neck: Supple, no JVD, carotid bruits, or masses noted.  Cardiac: Regular rate and rhythm. No real significant murmur of MR that I appreciate.  No edema.  Respiratory:  Lungs are quite congested - with rales/wheezing but with normal work of breathing.  GI: Soft and nontender.  MS: No deformity or atrophy. Gait and ROM intact.  Skin: Warm and dry. Color is normal.  Neuro:  Strength and sensation are intact and no gross focal deficits noted.  Psych: Alert, appropriate and with normal affect.   LABORATORY DATA:  EKG:  EKG is not ordered today.    Lab Results  Component Value Date   WBC 6.0 09/04/2020   HGB 9.2 (L) 09/04/2020   HCT 26.5 (L) 09/04/2020   PLT 217 09/04/2020   GLUCOSE 98 09/28/2020   CHOL 180 06/27/2020   TRIG 183 (H) 06/27/2020   HDL 37 (L) 06/27/2020   LDLCALC 113 (H) 06/27/2020   ALT 15 08/30/2020   AST 14 (L) 08/30/2020   NA 138 09/28/2020   K 4.2 09/28/2020   CL 91 (L) 09/28/2020   CREATININE 8.18 (H) 09/28/2020   BUN 49 (H) 09/28/2020   CO2 28 09/28/2020  TSH 2.290 05/01/2020   INR 1.06 07/15/2017   HGBA1C 5.3 05/01/2020   HGBA1C 5.3 05/01/2020   HGBA1C 5.3 (A) 05/01/2020   HGBA1C 5.3  05/01/2020     BNP (last 3 results) Recent Labs    08/30/20 1113  BNP 1,331.1*    ProBNP (last 3 results) No results for input(s): PROBNP in the last 8760 hours.   Other Studies Reviewed Today:  CORONARY CTA IMPRESSION: 1. Coronary calcium score of 437. This was 98th percentile for age and sex matched control.  2.  Normal coronary origin with left dominance.  3.  Mild atherosclerosis.  CAD RADS 2.  4. Recommend preventive pharmacotherapy and risk factor modification.  5.  This study has been sent for FFR flow analysis.  IMPRESSION: 1. Coronary CTA FFR flow analysis demonstrates no hemodynamically flow limiting lesion.  Fransico Him     Myoview 01/31/2020  Nuclear stress EF: 51%. The left ventricular ejection fraction is mildly decreased (45-54%). The LV appears to be mildly dilated.  There was no ST segment deviation noted during stress.  This is a low risk study. There is no evidence of ischemia or previous infarction.  The study is normal.     TEE 09/03/2020 1. Left ventricular ejection fraction, by estimation, is 50 to 55%. The  left ventricle has normal function. The left ventricle has no regional  wall motion abnormalities.   2. Right ventricular systolic function is normal. The right ventricular  size is normal.   3. Left atrial size was mildly dilated..   4. MV annulus measures 37 mm. The mitral leaflets appear normal but  coaptation not complete There are multiple jets of MR, the most prominent  is posteriorly directed, wrapping around posterior aspect of LA Appears  moderate to moderately severe  (Carpentier Type I). The mitral valve is normal in structure. Moderate to  severe mitral valve regurgitation.   5. The aortic valve is normal in structure. Aortic valve regurgitation is  mild.   6. Agitated saline contrast bubble study was negative, with no evidence  of any interatrial shunt.      ASSESSMENT:     1. Chest pain - recent  coronary CT - no flow limiting lesions - favor aggressive risk factor modification - this has improved.   2. ?URI - giving round of Doxycycline for one week - advised to get COVID testing despite being fully vaccinated.   3. Moderate to severe MR - s/p recent TEE - will get him in with Dr. [name] for structural heart clinic visit.   4. ESRD - on home dialysis  5. Prior pneumonia/CHF   Current medicines are reviewed with the patient today.  The patient does not have concerns regarding medicines other than what has been noted above.  The following changes have been made:  See above.  Labs/ tests ordered today include:   No orders of the defined types were placed in this encounter.    Disposition:   FU with Dr. [name] in the structural heart clinic.    Patient is agreeable to this plan and will call if any problems develop in the interim.   SignedTruitt Merle, NP  10/29/2020 3:09 PM  [facility] [address] [place], [place]  [address] Phone: [phone] Fax: [phone]

## 2020-10-16 DIAGNOSIS — D509 Iron deficiency anemia, unspecified: Secondary | ICD-10-CM | POA: Diagnosis not present

## 2020-10-16 DIAGNOSIS — N2581 Secondary hyperparathyroidism of renal origin: Secondary | ICD-10-CM | POA: Diagnosis not present

## 2020-10-16 DIAGNOSIS — E871 Hypo-osmolality and hyponatremia: Secondary | ICD-10-CM | POA: Diagnosis not present

## 2020-10-16 DIAGNOSIS — Z4931 Encounter for adequacy testing for hemodialysis: Secondary | ICD-10-CM | POA: Diagnosis not present

## 2020-10-16 DIAGNOSIS — Z79899 Other long term (current) drug therapy: Secondary | ICD-10-CM | POA: Diagnosis not present

## 2020-10-16 DIAGNOSIS — D631 Anemia in chronic kidney disease: Secondary | ICD-10-CM | POA: Diagnosis not present

## 2020-10-16 DIAGNOSIS — E440 Moderate protein-calorie malnutrition: Secondary | ICD-10-CM | POA: Diagnosis not present

## 2020-10-16 DIAGNOSIS — R82998 Other abnormal findings in urine: Secondary | ICD-10-CM | POA: Diagnosis not present

## 2020-10-16 DIAGNOSIS — E876 Hypokalemia: Secondary | ICD-10-CM | POA: Diagnosis not present

## 2020-10-16 DIAGNOSIS — N186 End stage renal disease: Secondary | ICD-10-CM | POA: Diagnosis not present

## 2020-10-16 DIAGNOSIS — E8770 Fluid overload, unspecified: Secondary | ICD-10-CM | POA: Diagnosis not present

## 2020-10-16 DIAGNOSIS — Z992 Dependence on renal dialysis: Secondary | ICD-10-CM | POA: Diagnosis not present

## 2020-10-17 DIAGNOSIS — I2510 Atherosclerotic heart disease of native coronary artery without angina pectoris: Secondary | ICD-10-CM | POA: Diagnosis not present

## 2020-10-17 DIAGNOSIS — R1030 Lower abdominal pain, unspecified: Secondary | ICD-10-CM | POA: Diagnosis not present

## 2020-10-17 DIAGNOSIS — R931 Abnormal findings on diagnostic imaging of heart and coronary circulation: Secondary | ICD-10-CM | POA: Diagnosis not present

## 2020-10-17 DIAGNOSIS — M6289 Other specified disorders of muscle: Secondary | ICD-10-CM | POA: Diagnosis not present

## 2020-10-17 DIAGNOSIS — M62838 Other muscle spasm: Secondary | ICD-10-CM | POA: Diagnosis not present

## 2020-10-17 DIAGNOSIS — M6281 Muscle weakness (generalized): Secondary | ICD-10-CM | POA: Diagnosis not present

## 2020-10-17 DIAGNOSIS — R3982 Chronic bladder pain: Secondary | ICD-10-CM | POA: Diagnosis not present

## 2020-10-18 DIAGNOSIS — D631 Anemia in chronic kidney disease: Secondary | ICD-10-CM | POA: Diagnosis not present

## 2020-10-18 DIAGNOSIS — Z4931 Encounter for adequacy testing for hemodialysis: Secondary | ICD-10-CM | POA: Diagnosis not present

## 2020-10-18 DIAGNOSIS — Z992 Dependence on renal dialysis: Secondary | ICD-10-CM | POA: Diagnosis not present

## 2020-10-18 DIAGNOSIS — N186 End stage renal disease: Secondary | ICD-10-CM | POA: Diagnosis not present

## 2020-10-18 DIAGNOSIS — E440 Moderate protein-calorie malnutrition: Secondary | ICD-10-CM | POA: Diagnosis not present

## 2020-10-18 DIAGNOSIS — N2581 Secondary hyperparathyroidism of renal origin: Secondary | ICD-10-CM | POA: Diagnosis not present

## 2020-10-18 DIAGNOSIS — E876 Hypokalemia: Secondary | ICD-10-CM | POA: Diagnosis not present

## 2020-10-18 DIAGNOSIS — E8770 Fluid overload, unspecified: Secondary | ICD-10-CM | POA: Diagnosis not present

## 2020-10-18 DIAGNOSIS — Z79899 Other long term (current) drug therapy: Secondary | ICD-10-CM | POA: Diagnosis not present

## 2020-10-18 DIAGNOSIS — D509 Iron deficiency anemia, unspecified: Secondary | ICD-10-CM | POA: Diagnosis not present

## 2020-10-18 DIAGNOSIS — E871 Hypo-osmolality and hyponatremia: Secondary | ICD-10-CM | POA: Diagnosis not present

## 2020-10-18 DIAGNOSIS — R82998 Other abnormal findings in urine: Secondary | ICD-10-CM | POA: Diagnosis not present

## 2020-10-19 DIAGNOSIS — D631 Anemia in chronic kidney disease: Secondary | ICD-10-CM | POA: Diagnosis not present

## 2020-10-19 DIAGNOSIS — D509 Iron deficiency anemia, unspecified: Secondary | ICD-10-CM | POA: Diagnosis not present

## 2020-10-19 DIAGNOSIS — R82998 Other abnormal findings in urine: Secondary | ICD-10-CM | POA: Diagnosis not present

## 2020-10-19 DIAGNOSIS — E440 Moderate protein-calorie malnutrition: Secondary | ICD-10-CM | POA: Diagnosis not present

## 2020-10-19 DIAGNOSIS — N186 End stage renal disease: Secondary | ICD-10-CM | POA: Diagnosis not present

## 2020-10-19 DIAGNOSIS — E8770 Fluid overload, unspecified: Secondary | ICD-10-CM | POA: Diagnosis not present

## 2020-10-19 DIAGNOSIS — N2581 Secondary hyperparathyroidism of renal origin: Secondary | ICD-10-CM | POA: Diagnosis not present

## 2020-10-19 DIAGNOSIS — E871 Hypo-osmolality and hyponatremia: Secondary | ICD-10-CM | POA: Diagnosis not present

## 2020-10-19 DIAGNOSIS — Z79899 Other long term (current) drug therapy: Secondary | ICD-10-CM | POA: Diagnosis not present

## 2020-10-19 DIAGNOSIS — E876 Hypokalemia: Secondary | ICD-10-CM | POA: Diagnosis not present

## 2020-10-19 DIAGNOSIS — Z992 Dependence on renal dialysis: Secondary | ICD-10-CM | POA: Diagnosis not present

## 2020-10-19 DIAGNOSIS — Z4931 Encounter for adequacy testing for hemodialysis: Secondary | ICD-10-CM | POA: Diagnosis not present

## 2020-10-22 DIAGNOSIS — Z79899 Other long term (current) drug therapy: Secondary | ICD-10-CM | POA: Diagnosis not present

## 2020-10-22 DIAGNOSIS — D509 Iron deficiency anemia, unspecified: Secondary | ICD-10-CM | POA: Diagnosis not present

## 2020-10-22 DIAGNOSIS — N186 End stage renal disease: Secondary | ICD-10-CM | POA: Diagnosis not present

## 2020-10-22 DIAGNOSIS — Z4931 Encounter for adequacy testing for hemodialysis: Secondary | ICD-10-CM | POA: Diagnosis not present

## 2020-10-22 DIAGNOSIS — E440 Moderate protein-calorie malnutrition: Secondary | ICD-10-CM | POA: Diagnosis not present

## 2020-10-22 DIAGNOSIS — N2581 Secondary hyperparathyroidism of renal origin: Secondary | ICD-10-CM | POA: Diagnosis not present

## 2020-10-22 DIAGNOSIS — E876 Hypokalemia: Secondary | ICD-10-CM | POA: Diagnosis not present

## 2020-10-22 DIAGNOSIS — E871 Hypo-osmolality and hyponatremia: Secondary | ICD-10-CM | POA: Diagnosis not present

## 2020-10-22 DIAGNOSIS — R82998 Other abnormal findings in urine: Secondary | ICD-10-CM | POA: Diagnosis not present

## 2020-10-22 DIAGNOSIS — D631 Anemia in chronic kidney disease: Secondary | ICD-10-CM | POA: Diagnosis not present

## 2020-10-22 DIAGNOSIS — E8770 Fluid overload, unspecified: Secondary | ICD-10-CM | POA: Diagnosis not present

## 2020-10-22 DIAGNOSIS — Z992 Dependence on renal dialysis: Secondary | ICD-10-CM | POA: Diagnosis not present

## 2020-10-23 DIAGNOSIS — R82998 Other abnormal findings in urine: Secondary | ICD-10-CM | POA: Diagnosis not present

## 2020-10-23 DIAGNOSIS — E876 Hypokalemia: Secondary | ICD-10-CM | POA: Diagnosis not present

## 2020-10-23 DIAGNOSIS — N2581 Secondary hyperparathyroidism of renal origin: Secondary | ICD-10-CM | POA: Diagnosis not present

## 2020-10-23 DIAGNOSIS — E440 Moderate protein-calorie malnutrition: Secondary | ICD-10-CM | POA: Diagnosis not present

## 2020-10-23 DIAGNOSIS — Z79899 Other long term (current) drug therapy: Secondary | ICD-10-CM | POA: Diagnosis not present

## 2020-10-23 DIAGNOSIS — D509 Iron deficiency anemia, unspecified: Secondary | ICD-10-CM | POA: Diagnosis not present

## 2020-10-23 DIAGNOSIS — E871 Hypo-osmolality and hyponatremia: Secondary | ICD-10-CM | POA: Diagnosis not present

## 2020-10-23 DIAGNOSIS — N186 End stage renal disease: Secondary | ICD-10-CM | POA: Diagnosis not present

## 2020-10-23 DIAGNOSIS — E8770 Fluid overload, unspecified: Secondary | ICD-10-CM | POA: Diagnosis not present

## 2020-10-23 DIAGNOSIS — Z992 Dependence on renal dialysis: Secondary | ICD-10-CM | POA: Diagnosis not present

## 2020-10-23 DIAGNOSIS — D631 Anemia in chronic kidney disease: Secondary | ICD-10-CM | POA: Diagnosis not present

## 2020-10-23 DIAGNOSIS — Z4931 Encounter for adequacy testing for hemodialysis: Secondary | ICD-10-CM | POA: Diagnosis not present

## 2020-10-24 ENCOUNTER — Other Ambulatory Visit: Payer: Self-pay | Admitting: Cardiovascular Disease

## 2020-10-25 DIAGNOSIS — Z4931 Encounter for adequacy testing for hemodialysis: Secondary | ICD-10-CM | POA: Diagnosis not present

## 2020-10-25 DIAGNOSIS — N2581 Secondary hyperparathyroidism of renal origin: Secondary | ICD-10-CM | POA: Diagnosis not present

## 2020-10-25 DIAGNOSIS — N186 End stage renal disease: Secondary | ICD-10-CM | POA: Diagnosis not present

## 2020-10-25 DIAGNOSIS — Z992 Dependence on renal dialysis: Secondary | ICD-10-CM | POA: Diagnosis not present

## 2020-10-25 DIAGNOSIS — E871 Hypo-osmolality and hyponatremia: Secondary | ICD-10-CM | POA: Diagnosis not present

## 2020-10-25 DIAGNOSIS — Z79899 Other long term (current) drug therapy: Secondary | ICD-10-CM | POA: Diagnosis not present

## 2020-10-25 DIAGNOSIS — R82998 Other abnormal findings in urine: Secondary | ICD-10-CM | POA: Diagnosis not present

## 2020-10-25 DIAGNOSIS — E8770 Fluid overload, unspecified: Secondary | ICD-10-CM | POA: Diagnosis not present

## 2020-10-25 DIAGNOSIS — D509 Iron deficiency anemia, unspecified: Secondary | ICD-10-CM | POA: Diagnosis not present

## 2020-10-25 DIAGNOSIS — D631 Anemia in chronic kidney disease: Secondary | ICD-10-CM | POA: Diagnosis not present

## 2020-10-25 DIAGNOSIS — E440 Moderate protein-calorie malnutrition: Secondary | ICD-10-CM | POA: Diagnosis not present

## 2020-10-25 DIAGNOSIS — E876 Hypokalemia: Secondary | ICD-10-CM | POA: Diagnosis not present

## 2020-10-26 DIAGNOSIS — D631 Anemia in chronic kidney disease: Secondary | ICD-10-CM | POA: Diagnosis not present

## 2020-10-26 DIAGNOSIS — N186 End stage renal disease: Secondary | ICD-10-CM | POA: Diagnosis not present

## 2020-10-26 DIAGNOSIS — N2581 Secondary hyperparathyroidism of renal origin: Secondary | ICD-10-CM | POA: Diagnosis not present

## 2020-10-26 DIAGNOSIS — R82998 Other abnormal findings in urine: Secondary | ICD-10-CM | POA: Diagnosis not present

## 2020-10-26 DIAGNOSIS — D509 Iron deficiency anemia, unspecified: Secondary | ICD-10-CM | POA: Diagnosis not present

## 2020-10-26 DIAGNOSIS — Z992 Dependence on renal dialysis: Secondary | ICD-10-CM | POA: Diagnosis not present

## 2020-10-26 DIAGNOSIS — Z79899 Other long term (current) drug therapy: Secondary | ICD-10-CM | POA: Diagnosis not present

## 2020-10-26 DIAGNOSIS — E871 Hypo-osmolality and hyponatremia: Secondary | ICD-10-CM | POA: Diagnosis not present

## 2020-10-26 DIAGNOSIS — Z4931 Encounter for adequacy testing for hemodialysis: Secondary | ICD-10-CM | POA: Diagnosis not present

## 2020-10-26 DIAGNOSIS — I129 Hypertensive chronic kidney disease with stage 1 through stage 4 chronic kidney disease, or unspecified chronic kidney disease: Secondary | ICD-10-CM | POA: Diagnosis not present

## 2020-10-26 DIAGNOSIS — E8770 Fluid overload, unspecified: Secondary | ICD-10-CM | POA: Diagnosis not present

## 2020-10-26 DIAGNOSIS — E876 Hypokalemia: Secondary | ICD-10-CM | POA: Diagnosis not present

## 2020-10-26 DIAGNOSIS — E440 Moderate protein-calorie malnutrition: Secondary | ICD-10-CM | POA: Diagnosis not present

## 2020-10-29 ENCOUNTER — Encounter: Payer: Self-pay | Admitting: Nurse Practitioner

## 2020-10-29 ENCOUNTER — Ambulatory Visit (INDEPENDENT_AMBULATORY_CARE_PROVIDER_SITE_OTHER): Payer: Medicare Other | Admitting: Nurse Practitioner

## 2020-10-29 ENCOUNTER — Other Ambulatory Visit: Payer: Self-pay

## 2020-10-29 VITALS — BP 110/70 | HR 72 | Ht 70.0 in | Wt 203.0 lb

## 2020-10-29 DIAGNOSIS — N2581 Secondary hyperparathyroidism of renal origin: Secondary | ICD-10-CM | POA: Diagnosis not present

## 2020-10-29 DIAGNOSIS — D509 Iron deficiency anemia, unspecified: Secondary | ICD-10-CM | POA: Diagnosis not present

## 2020-10-29 DIAGNOSIS — N186 End stage renal disease: Secondary | ICD-10-CM | POA: Diagnosis not present

## 2020-10-29 DIAGNOSIS — I110 Hypertensive heart disease with heart failure: Secondary | ICD-10-CM

## 2020-10-29 DIAGNOSIS — R079 Chest pain, unspecified: Secondary | ICD-10-CM

## 2020-10-29 DIAGNOSIS — I340 Nonrheumatic mitral (valve) insufficiency: Secondary | ICD-10-CM | POA: Diagnosis not present

## 2020-10-29 DIAGNOSIS — N2589 Other disorders resulting from impaired renal tubular function: Secondary | ICD-10-CM | POA: Diagnosis not present

## 2020-10-29 DIAGNOSIS — E876 Hypokalemia: Secondary | ICD-10-CM | POA: Diagnosis not present

## 2020-10-29 DIAGNOSIS — E440 Moderate protein-calorie malnutrition: Secondary | ICD-10-CM | POA: Diagnosis not present

## 2020-10-29 DIAGNOSIS — D631 Anemia in chronic kidney disease: Secondary | ICD-10-CM | POA: Diagnosis not present

## 2020-10-29 DIAGNOSIS — E079 Disorder of thyroid, unspecified: Secondary | ICD-10-CM | POA: Diagnosis not present

## 2020-10-29 DIAGNOSIS — Z992 Dependence on renal dialysis: Secondary | ICD-10-CM | POA: Diagnosis not present

## 2020-10-29 DIAGNOSIS — R82998 Other abnormal findings in urine: Secondary | ICD-10-CM | POA: Diagnosis not present

## 2020-10-29 DIAGNOSIS — E8770 Fluid overload, unspecified: Secondary | ICD-10-CM | POA: Diagnosis not present

## 2020-10-29 DIAGNOSIS — Z79899 Other long term (current) drug therapy: Secondary | ICD-10-CM | POA: Diagnosis not present

## 2020-10-29 MED ORDER — DOXYCYCLINE HYCLATE 100 MG PO TABS: 100.0000 mg | ORAL_TABLET | Freq: Two times a day (BID) | ORAL | 0 refills | Status: DC

## 2020-10-29 NOTE — Patient Instructions (Addendum)
After Visit Summary:  We will be checking the following labs today - NONE   Medication Instructions:    Continue with your current medicines. BUT  I am doing to give you a round of antibiotics for the next week - Doxycycline 100 mg to take twice a day - this is at your pharmacy. This is at your pharmacy.    If you need a refill on your cardiac medications before your next appointment, please call your pharmacy.     Testing/Procedures To Be Arranged:  N/A  Follow-Up:   See Dr. [name] for structural heart for the valve leaking.     At [facility], you and your health needs are our priority.  As part of our continuing mission to provide you with exceptional heart care, we have created designated Provider Care Teams.  These Care Teams include your primary Cardiologist (physician) and Advanced Practice Providers (APPs -  Physician Assistants and Nurse Practitioners) who all work together to provide you with the care you need, when you need it.  Special Instructions:  . Stay safe, wash your hands for at least 20 seconds and wear a mask when needed.  . It was good to talk with you today.  . Go get a COVID test   Call the [place] office at [phone] if you have any questions, problems or concerns.

## 2020-10-30 DIAGNOSIS — D631 Anemia in chronic kidney disease: Secondary | ICD-10-CM | POA: Diagnosis not present

## 2020-10-30 DIAGNOSIS — E440 Moderate protein-calorie malnutrition: Secondary | ICD-10-CM | POA: Diagnosis not present

## 2020-10-30 DIAGNOSIS — N186 End stage renal disease: Secondary | ICD-10-CM | POA: Diagnosis not present

## 2020-10-30 DIAGNOSIS — E079 Disorder of thyroid, unspecified: Secondary | ICD-10-CM | POA: Diagnosis not present

## 2020-10-30 DIAGNOSIS — Z992 Dependence on renal dialysis: Secondary | ICD-10-CM | POA: Diagnosis not present

## 2020-10-30 DIAGNOSIS — R82998 Other abnormal findings in urine: Secondary | ICD-10-CM | POA: Diagnosis not present

## 2020-10-30 DIAGNOSIS — E8770 Fluid overload, unspecified: Secondary | ICD-10-CM | POA: Diagnosis not present

## 2020-10-30 DIAGNOSIS — Z79899 Other long term (current) drug therapy: Secondary | ICD-10-CM | POA: Diagnosis not present

## 2020-10-30 DIAGNOSIS — D509 Iron deficiency anemia, unspecified: Secondary | ICD-10-CM | POA: Diagnosis not present

## 2020-10-30 DIAGNOSIS — N2589 Other disorders resulting from impaired renal tubular function: Secondary | ICD-10-CM | POA: Diagnosis not present

## 2020-10-30 DIAGNOSIS — E876 Hypokalemia: Secondary | ICD-10-CM | POA: Diagnosis not present

## 2020-10-30 DIAGNOSIS — N2581 Secondary hyperparathyroidism of renal origin: Secondary | ICD-10-CM | POA: Diagnosis not present

## 2020-11-01 DIAGNOSIS — D509 Iron deficiency anemia, unspecified: Secondary | ICD-10-CM | POA: Diagnosis not present

## 2020-11-01 DIAGNOSIS — E8770 Fluid overload, unspecified: Secondary | ICD-10-CM | POA: Diagnosis not present

## 2020-11-01 DIAGNOSIS — Z992 Dependence on renal dialysis: Secondary | ICD-10-CM | POA: Diagnosis not present

## 2020-11-01 DIAGNOSIS — N2581 Secondary hyperparathyroidism of renal origin: Secondary | ICD-10-CM | POA: Diagnosis not present

## 2020-11-01 DIAGNOSIS — N186 End stage renal disease: Secondary | ICD-10-CM | POA: Diagnosis not present

## 2020-11-01 DIAGNOSIS — Z79899 Other long term (current) drug therapy: Secondary | ICD-10-CM | POA: Diagnosis not present

## 2020-11-01 DIAGNOSIS — E079 Disorder of thyroid, unspecified: Secondary | ICD-10-CM | POA: Diagnosis not present

## 2020-11-01 DIAGNOSIS — D631 Anemia in chronic kidney disease: Secondary | ICD-10-CM | POA: Diagnosis not present

## 2020-11-01 DIAGNOSIS — R82998 Other abnormal findings in urine: Secondary | ICD-10-CM | POA: Diagnosis not present

## 2020-11-01 DIAGNOSIS — E876 Hypokalemia: Secondary | ICD-10-CM | POA: Diagnosis not present

## 2020-11-01 DIAGNOSIS — N2589 Other disorders resulting from impaired renal tubular function: Secondary | ICD-10-CM | POA: Diagnosis not present

## 2020-11-01 DIAGNOSIS — E440 Moderate protein-calorie malnutrition: Secondary | ICD-10-CM | POA: Diagnosis not present

## 2020-11-02 DIAGNOSIS — D509 Iron deficiency anemia, unspecified: Secondary | ICD-10-CM | POA: Diagnosis not present

## 2020-11-02 DIAGNOSIS — D631 Anemia in chronic kidney disease: Secondary | ICD-10-CM | POA: Diagnosis not present

## 2020-11-02 DIAGNOSIS — E876 Hypokalemia: Secondary | ICD-10-CM | POA: Diagnosis not present

## 2020-11-02 DIAGNOSIS — Z79899 Other long term (current) drug therapy: Secondary | ICD-10-CM | POA: Diagnosis not present

## 2020-11-02 DIAGNOSIS — N186 End stage renal disease: Secondary | ICD-10-CM | POA: Diagnosis not present

## 2020-11-02 DIAGNOSIS — N2581 Secondary hyperparathyroidism of renal origin: Secondary | ICD-10-CM | POA: Diagnosis not present

## 2020-11-02 DIAGNOSIS — N2589 Other disorders resulting from impaired renal tubular function: Secondary | ICD-10-CM | POA: Diagnosis not present

## 2020-11-02 DIAGNOSIS — R82998 Other abnormal findings in urine: Secondary | ICD-10-CM | POA: Diagnosis not present

## 2020-11-02 DIAGNOSIS — Z992 Dependence on renal dialysis: Secondary | ICD-10-CM | POA: Diagnosis not present

## 2020-11-02 DIAGNOSIS — E440 Moderate protein-calorie malnutrition: Secondary | ICD-10-CM | POA: Diagnosis not present

## 2020-11-02 DIAGNOSIS — E8770 Fluid overload, unspecified: Secondary | ICD-10-CM | POA: Diagnosis not present

## 2020-11-02 DIAGNOSIS — E079 Disorder of thyroid, unspecified: Secondary | ICD-10-CM | POA: Diagnosis not present

## 2020-11-05 DIAGNOSIS — N2581 Secondary hyperparathyroidism of renal origin: Secondary | ICD-10-CM | POA: Diagnosis not present

## 2020-11-05 DIAGNOSIS — Z992 Dependence on renal dialysis: Secondary | ICD-10-CM | POA: Diagnosis not present

## 2020-11-05 DIAGNOSIS — Z79899 Other long term (current) drug therapy: Secondary | ICD-10-CM | POA: Diagnosis not present

## 2020-11-05 DIAGNOSIS — E876 Hypokalemia: Secondary | ICD-10-CM | POA: Diagnosis not present

## 2020-11-05 DIAGNOSIS — D509 Iron deficiency anemia, unspecified: Secondary | ICD-10-CM | POA: Diagnosis not present

## 2020-11-05 DIAGNOSIS — R82998 Other abnormal findings in urine: Secondary | ICD-10-CM | POA: Diagnosis not present

## 2020-11-05 DIAGNOSIS — E440 Moderate protein-calorie malnutrition: Secondary | ICD-10-CM | POA: Diagnosis not present

## 2020-11-05 DIAGNOSIS — D631 Anemia in chronic kidney disease: Secondary | ICD-10-CM | POA: Diagnosis not present

## 2020-11-05 DIAGNOSIS — N186 End stage renal disease: Secondary | ICD-10-CM | POA: Diagnosis not present

## 2020-11-05 DIAGNOSIS — E8770 Fluid overload, unspecified: Secondary | ICD-10-CM | POA: Diagnosis not present

## 2020-11-05 DIAGNOSIS — E079 Disorder of thyroid, unspecified: Secondary | ICD-10-CM | POA: Diagnosis not present

## 2020-11-05 DIAGNOSIS — I871 Compression of vein: Secondary | ICD-10-CM | POA: Diagnosis not present

## 2020-11-05 DIAGNOSIS — N2589 Other disorders resulting from impaired renal tubular function: Secondary | ICD-10-CM | POA: Diagnosis not present

## 2020-11-05 NOTE — Progress Notes (Signed)
Albert Eaton DOB 06/29/1966  This looks like a clippable valve. The fossa looks approachable for transseptal puncture in the Bicaval view. PFO is noted. LA dimensions are large enough for device steering and straddle. The MR jet looks lateral to central. The posterior leaflet measures over 1.5 cm in the 151 LVOT grasping view. I would like to verify both MVA and gradient in the case prior to start. Based on this information, I'd recommend starting with an NTW or XTW and assessing for gradient.

## 2020-11-06 ENCOUNTER — Telehealth (INDEPENDENT_AMBULATORY_CARE_PROVIDER_SITE_OTHER): Payer: Medicare Other | Admitting: Family Medicine

## 2020-11-06 ENCOUNTER — Other Ambulatory Visit: Payer: Self-pay

## 2020-11-06 DIAGNOSIS — D631 Anemia in chronic kidney disease: Secondary | ICD-10-CM | POA: Diagnosis not present

## 2020-11-06 DIAGNOSIS — G4730 Sleep apnea, unspecified: Secondary | ICD-10-CM | POA: Diagnosis not present

## 2020-11-06 DIAGNOSIS — E559 Vitamin D deficiency, unspecified: Secondary | ICD-10-CM

## 2020-11-06 DIAGNOSIS — B20 Human immunodeficiency virus [HIV] disease: Secondary | ICD-10-CM | POA: Diagnosis not present

## 2020-11-06 DIAGNOSIS — N2581 Secondary hyperparathyroidism of renal origin: Secondary | ICD-10-CM | POA: Diagnosis not present

## 2020-11-06 DIAGNOSIS — I110 Hypertensive heart disease with heart failure: Secondary | ICD-10-CM

## 2020-11-06 DIAGNOSIS — E079 Disorder of thyroid, unspecified: Secondary | ICD-10-CM | POA: Diagnosis not present

## 2020-11-06 DIAGNOSIS — E876 Hypokalemia: Secondary | ICD-10-CM | POA: Diagnosis not present

## 2020-11-06 DIAGNOSIS — Z992 Dependence on renal dialysis: Secondary | ICD-10-CM | POA: Diagnosis not present

## 2020-11-06 DIAGNOSIS — N2589 Other disorders resulting from impaired renal tubular function: Secondary | ICD-10-CM | POA: Diagnosis not present

## 2020-11-06 DIAGNOSIS — E039 Hypothyroidism, unspecified: Secondary | ICD-10-CM | POA: Diagnosis not present

## 2020-11-06 DIAGNOSIS — Z79899 Other long term (current) drug therapy: Secondary | ICD-10-CM | POA: Diagnosis not present

## 2020-11-06 DIAGNOSIS — N186 End stage renal disease: Secondary | ICD-10-CM | POA: Diagnosis not present

## 2020-11-06 DIAGNOSIS — E8770 Fluid overload, unspecified: Secondary | ICD-10-CM | POA: Diagnosis not present

## 2020-11-06 DIAGNOSIS — R82998 Other abnormal findings in urine: Secondary | ICD-10-CM | POA: Diagnosis not present

## 2020-11-06 DIAGNOSIS — E440 Moderate protein-calorie malnutrition: Secondary | ICD-10-CM | POA: Diagnosis not present

## 2020-11-06 DIAGNOSIS — D509 Iron deficiency anemia, unspecified: Secondary | ICD-10-CM | POA: Diagnosis not present

## 2020-11-06 MED ORDER — VITAMIN D (ERGOCALCIFEROL) 1.25 MG (50000 UNIT) PO CAPS: ORAL_CAPSULE | ORAL | 1 refills | Status: DC

## 2020-11-06 NOTE — Progress Notes (Signed)
Virtual Visit via Telephone Note  I connected with Albert Eaton on 11/06/20 at  1:30 PM EST by telephone and verified that I am speaking with the correct person using two identifiers.  Location: Patient: [facility]  Provider: [address] [place]   I discussed the limitations, risks, security and privacy concerns of performing an evaluation and management service by telephone and the availability of in person appointments. I also discussed with the patient that there may be a patient responsible charge related to this service. The patient expressed understanding and agreed to proceed.   History of Present Illness: Albert Eaton is a 55 year old male with a past medical history significant for end-stage renal disease, hypertension, hypothyroidism, chronic combined diastolic heart failure, gout, OSA, and HIV on [place] therapy presents for follow-up of chronic conditions.  Patient says that he is feeling well and does not have any complaints on today.  Patient has end-stage renal disease and follows up for dialysis 3 times per week.  He states that he has been receiving dialysis consistently and without complication over the past several months.  Patient is followed closely by nephrology.  He also has a history of HIV disease and has been adherent to medication regimen and follows up with infectious disease around every 6 months. Patient is up-to-date with all vaccinations including COVID-19. He is also up-to-date with colonoscopy. Patient does not check blood pressure at home.  He has been following a low-fat, low-carb diet divided over small meals throughout the day.  He does not exercise consistently.  He denies any headache, chest pain, shortness of breath, nausea, vomiting, or diarrhea today.   Past Medical History:  Diagnosis Date  . Anemia   . Aortic atherosclerosis ([place])   . Asthma   . Chronic diastolic CHF (congestive heart failure) ([place]) 01/06/2017   Echo 7/16 [facility]  in [place], [name]) Mild AI, mild LAE, mild concentric LVH, EF 55, normal wall motion, mild to moderate MR, mild PI, RVSP 55 // Echo 10/09/16 ([facility]):  Moderate LVH, grade 2 diastolic dysfunction, mild MR, moderate LAE   . CKD (chronic kidney disease)    per pt, waiting on a kidney transplant in near future.  . Esophagitis   . Gout   . Hepatitis    Hep B  . History of nuclear stress test    a. Nuc study 7/16: no scar or ischemia, EF 42 // b. Nuc study 12/17: EF 48, ?small apical ischemia, Low Risk  . HIV (human immunodeficiency virus infection) ([place])   . HLD (hyperlipidemia)   . Hypertension   . Hypertensive heart disease with CHF (congestive heart failure) ([place]) 10/09/2016  . Mitral regurgitation   . OSA (obstructive sleep apnea)   . Post-operative nausea and vomiting    1 time as a child  . Sleep apnea    uses c-pap  . Wears glasses   . Wears partial dentures    Social History   Socioeconomic History  . Marital status: Single    Spouse name: Not on file  . Number of children: Not on file  . Years of education: Not on file  . Highest education level: Not on file  Occupational History  . Not on file  Tobacco Use  . Smoking status: Former Smoker    Types: Cigarettes    Quit date: 2000    Years since quitting: 22.0  . Smokeless tobacco: Never Used  Vaping Use  . Vaping Use: Never used  Substance  and Sexual Activity  . Alcohol use: Yes    Comment: once a month   . Drug use: No  . Sexual activity: Not Currently    Partners: Male    Birth control/protection: Condom    Comment: declined  condoms 07/11/20  Other Topics Concern  . Not on file  Social History Narrative  . Not on file   Social Determinants of Health   Financial Resource Strain: Not on file  Food Insecurity: No Food Insecurity  . Worried About Charity fundraiser in the Last Year: Never true  . Ran Out of Food in the Last Year: Never true  Transportation Needs: No Transportation Needs  . Lack of  Transportation (Medical): No  . Lack of Transportation (Non-Medical): No  Physical Activity: Not on file  Stress: Not on file  Social Connections: Not on file  Intimate Partner Violence: Not on file   Allergies  Allergen Reactions  . Ace Inhibitors Cough and Other (See Comments)  . Lisinopril Cough    Review of Systems  Constitutional: Negative for chills and fever.  HENT: Negative.   Eyes: Negative.   Cardiovascular: Negative.   Gastrointestinal: Negative.   Genitourinary: Negative.   Musculoskeletal: Positive for back pain and joint pain.  Skin: Negative.   Neurological: Negative.   Psychiatric/Behavioral: Negative.       Assessment and Plan: 1. HIV disease ([place]) Advised patient to follow-up with infectious disease provider as scheduled.  Continue antiviral medications.  Viral load is undetectable.  2. Hypothyroidism, unspecified type Continue medication as previously prescribed.  No changes warranted today.  Follow-up for thyroid panel within the next 2 weeks. - Thyroid Panel With TSH; Future  3. Hypertensive heart disease with congestive heart failure, unspecified heart failure type [facility]) Currently under the care of cardiology, no changes in medications at this time.  4. Sleep apnea, unspecified type Patient has a history of OSA.  Continue CPAP.  5. Vitamin D deficiency - Vitamin D, Ergocalciferol, (DRISDOL) 1.25 MG (50000 UNIT) CAPS capsule; TAKE 1 CAPSULE BY MOUTH EVERY 7 DAYS  Dispense: 12 capsule; Refill: 1  Follow Up Instructions:    I discussed the assessment and treatment plan with the patient. The patient was provided an opportunity to ask questions and all were answered. The patient agreed with the plan and demonstrated an understanding of the instructions.   The patient was advised to call back or seek an in-person evaluation if the symptoms worsen or if the condition fails to improve as anticipated.  I provided 10 minutes of non-face-to-face time  during this encounter.   [name]  APRN, MSN, FNP-C [facility] [address] [place], [place] [address] [phone]

## 2020-11-07 DIAGNOSIS — Z992 Dependence on renal dialysis: Secondary | ICD-10-CM | POA: Diagnosis not present

## 2020-11-07 DIAGNOSIS — I361 Nonrheumatic tricuspid (valve) insufficiency: Secondary | ICD-10-CM | POA: Diagnosis not present

## 2020-11-07 DIAGNOSIS — N186 End stage renal disease: Secondary | ICD-10-CM | POA: Diagnosis not present

## 2020-11-07 DIAGNOSIS — I517 Cardiomegaly: Secondary | ICD-10-CM | POA: Diagnosis not present

## 2020-11-07 DIAGNOSIS — K449 Diaphragmatic hernia without obstruction or gangrene: Secondary | ICD-10-CM | POA: Diagnosis not present

## 2020-11-07 DIAGNOSIS — Z01818 Encounter for other preprocedural examination: Secondary | ICD-10-CM | POA: Diagnosis not present

## 2020-11-07 DIAGNOSIS — Z01810 Encounter for preprocedural cardiovascular examination: Secondary | ICD-10-CM | POA: Diagnosis not present

## 2020-11-07 DIAGNOSIS — I348 Other nonrheumatic mitral valve disorders: Secondary | ICD-10-CM | POA: Diagnosis not present

## 2020-11-07 DIAGNOSIS — I340 Nonrheumatic mitral (valve) insufficiency: Secondary | ICD-10-CM | POA: Diagnosis not present

## 2020-11-08 DIAGNOSIS — Z79899 Other long term (current) drug therapy: Secondary | ICD-10-CM | POA: Diagnosis not present

## 2020-11-08 DIAGNOSIS — E876 Hypokalemia: Secondary | ICD-10-CM | POA: Diagnosis not present

## 2020-11-08 DIAGNOSIS — Z992 Dependence on renal dialysis: Secondary | ICD-10-CM | POA: Diagnosis not present

## 2020-11-08 DIAGNOSIS — N2589 Other disorders resulting from impaired renal tubular function: Secondary | ICD-10-CM | POA: Diagnosis not present

## 2020-11-08 DIAGNOSIS — D631 Anemia in chronic kidney disease: Secondary | ICD-10-CM | POA: Diagnosis not present

## 2020-11-08 DIAGNOSIS — E440 Moderate protein-calorie malnutrition: Secondary | ICD-10-CM | POA: Diagnosis not present

## 2020-11-08 DIAGNOSIS — N186 End stage renal disease: Secondary | ICD-10-CM | POA: Diagnosis not present

## 2020-11-08 DIAGNOSIS — D509 Iron deficiency anemia, unspecified: Secondary | ICD-10-CM | POA: Diagnosis not present

## 2020-11-08 DIAGNOSIS — E8770 Fluid overload, unspecified: Secondary | ICD-10-CM | POA: Diagnosis not present

## 2020-11-08 DIAGNOSIS — E079 Disorder of thyroid, unspecified: Secondary | ICD-10-CM | POA: Diagnosis not present

## 2020-11-08 DIAGNOSIS — R82998 Other abnormal findings in urine: Secondary | ICD-10-CM | POA: Diagnosis not present

## 2020-11-08 DIAGNOSIS — N2581 Secondary hyperparathyroidism of renal origin: Secondary | ICD-10-CM | POA: Diagnosis not present

## 2020-11-09 DIAGNOSIS — E079 Disorder of thyroid, unspecified: Secondary | ICD-10-CM | POA: Diagnosis not present

## 2020-11-09 DIAGNOSIS — E876 Hypokalemia: Secondary | ICD-10-CM | POA: Diagnosis not present

## 2020-11-09 DIAGNOSIS — D509 Iron deficiency anemia, unspecified: Secondary | ICD-10-CM | POA: Diagnosis not present

## 2020-11-09 DIAGNOSIS — E440 Moderate protein-calorie malnutrition: Secondary | ICD-10-CM | POA: Diagnosis not present

## 2020-11-09 DIAGNOSIS — N186 End stage renal disease: Secondary | ICD-10-CM | POA: Diagnosis not present

## 2020-11-09 DIAGNOSIS — E8770 Fluid overload, unspecified: Secondary | ICD-10-CM | POA: Diagnosis not present

## 2020-11-09 DIAGNOSIS — D631 Anemia in chronic kidney disease: Secondary | ICD-10-CM | POA: Diagnosis not present

## 2020-11-09 DIAGNOSIS — N2581 Secondary hyperparathyroidism of renal origin: Secondary | ICD-10-CM | POA: Diagnosis not present

## 2020-11-09 DIAGNOSIS — R82998 Other abnormal findings in urine: Secondary | ICD-10-CM | POA: Diagnosis not present

## 2020-11-09 DIAGNOSIS — Z992 Dependence on renal dialysis: Secondary | ICD-10-CM | POA: Diagnosis not present

## 2020-11-09 DIAGNOSIS — N2589 Other disorders resulting from impaired renal tubular function: Secondary | ICD-10-CM | POA: Diagnosis not present

## 2020-11-09 DIAGNOSIS — Z79899 Other long term (current) drug therapy: Secondary | ICD-10-CM | POA: Diagnosis not present

## 2020-11-10 ENCOUNTER — Other Ambulatory Visit: Payer: Self-pay | Admitting: Allergy & Immunology

## 2020-11-12 ENCOUNTER — Other Ambulatory Visit: Payer: Self-pay | Admitting: Internal Medicine

## 2020-11-13 DIAGNOSIS — D631 Anemia in chronic kidney disease: Secondary | ICD-10-CM | POA: Diagnosis not present

## 2020-11-13 DIAGNOSIS — E079 Disorder of thyroid, unspecified: Secondary | ICD-10-CM | POA: Diagnosis not present

## 2020-11-13 DIAGNOSIS — D509 Iron deficiency anemia, unspecified: Secondary | ICD-10-CM | POA: Diagnosis not present

## 2020-11-13 DIAGNOSIS — N2589 Other disorders resulting from impaired renal tubular function: Secondary | ICD-10-CM | POA: Diagnosis not present

## 2020-11-13 DIAGNOSIS — N186 End stage renal disease: Secondary | ICD-10-CM | POA: Diagnosis not present

## 2020-11-13 DIAGNOSIS — E876 Hypokalemia: Secondary | ICD-10-CM | POA: Diagnosis not present

## 2020-11-13 DIAGNOSIS — N2581 Secondary hyperparathyroidism of renal origin: Secondary | ICD-10-CM | POA: Diagnosis not present

## 2020-11-13 DIAGNOSIS — R82998 Other abnormal findings in urine: Secondary | ICD-10-CM | POA: Diagnosis not present

## 2020-11-13 DIAGNOSIS — Z992 Dependence on renal dialysis: Secondary | ICD-10-CM | POA: Diagnosis not present

## 2020-11-13 DIAGNOSIS — E440 Moderate protein-calorie malnutrition: Secondary | ICD-10-CM | POA: Diagnosis not present

## 2020-11-13 DIAGNOSIS — Z79899 Other long term (current) drug therapy: Secondary | ICD-10-CM | POA: Diagnosis not present

## 2020-11-13 DIAGNOSIS — E8770 Fluid overload, unspecified: Secondary | ICD-10-CM | POA: Diagnosis not present

## 2020-11-14 DIAGNOSIS — Z992 Dependence on renal dialysis: Secondary | ICD-10-CM | POA: Diagnosis not present

## 2020-11-14 DIAGNOSIS — N186 End stage renal disease: Secondary | ICD-10-CM | POA: Diagnosis not present

## 2020-11-14 DIAGNOSIS — N2589 Other disorders resulting from impaired renal tubular function: Secondary | ICD-10-CM | POA: Diagnosis not present

## 2020-11-14 DIAGNOSIS — D631 Anemia in chronic kidney disease: Secondary | ICD-10-CM | POA: Diagnosis not present

## 2020-11-14 DIAGNOSIS — R82998 Other abnormal findings in urine: Secondary | ICD-10-CM | POA: Diagnosis not present

## 2020-11-14 DIAGNOSIS — E440 Moderate protein-calorie malnutrition: Secondary | ICD-10-CM | POA: Diagnosis not present

## 2020-11-14 DIAGNOSIS — D509 Iron deficiency anemia, unspecified: Secondary | ICD-10-CM | POA: Diagnosis not present

## 2020-11-14 DIAGNOSIS — Z79899 Other long term (current) drug therapy: Secondary | ICD-10-CM | POA: Diagnosis not present

## 2020-11-14 DIAGNOSIS — E876 Hypokalemia: Secondary | ICD-10-CM | POA: Diagnosis not present

## 2020-11-14 DIAGNOSIS — N2581 Secondary hyperparathyroidism of renal origin: Secondary | ICD-10-CM | POA: Diagnosis not present

## 2020-11-14 DIAGNOSIS — E8770 Fluid overload, unspecified: Secondary | ICD-10-CM | POA: Diagnosis not present

## 2020-11-14 DIAGNOSIS — E079 Disorder of thyroid, unspecified: Secondary | ICD-10-CM | POA: Diagnosis not present

## 2020-11-15 DIAGNOSIS — N186 End stage renal disease: Secondary | ICD-10-CM | POA: Diagnosis not present

## 2020-11-15 DIAGNOSIS — N2589 Other disorders resulting from impaired renal tubular function: Secondary | ICD-10-CM | POA: Diagnosis not present

## 2020-11-15 DIAGNOSIS — R82998 Other abnormal findings in urine: Secondary | ICD-10-CM | POA: Diagnosis not present

## 2020-11-15 DIAGNOSIS — E876 Hypokalemia: Secondary | ICD-10-CM | POA: Diagnosis not present

## 2020-11-15 DIAGNOSIS — E079 Disorder of thyroid, unspecified: Secondary | ICD-10-CM | POA: Diagnosis not present

## 2020-11-15 DIAGNOSIS — N2581 Secondary hyperparathyroidism of renal origin: Secondary | ICD-10-CM | POA: Diagnosis not present

## 2020-11-15 DIAGNOSIS — D631 Anemia in chronic kidney disease: Secondary | ICD-10-CM | POA: Diagnosis not present

## 2020-11-15 DIAGNOSIS — D509 Iron deficiency anemia, unspecified: Secondary | ICD-10-CM | POA: Diagnosis not present

## 2020-11-15 DIAGNOSIS — E8770 Fluid overload, unspecified: Secondary | ICD-10-CM | POA: Diagnosis not present

## 2020-11-15 DIAGNOSIS — Z992 Dependence on renal dialysis: Secondary | ICD-10-CM | POA: Diagnosis not present

## 2020-11-15 DIAGNOSIS — E440 Moderate protein-calorie malnutrition: Secondary | ICD-10-CM | POA: Diagnosis not present

## 2020-11-15 DIAGNOSIS — Z79899 Other long term (current) drug therapy: Secondary | ICD-10-CM | POA: Diagnosis not present

## 2020-11-16 DIAGNOSIS — N2589 Other disorders resulting from impaired renal tubular function: Secondary | ICD-10-CM | POA: Diagnosis not present

## 2020-11-16 DIAGNOSIS — E876 Hypokalemia: Secondary | ICD-10-CM | POA: Diagnosis not present

## 2020-11-16 DIAGNOSIS — D631 Anemia in chronic kidney disease: Secondary | ICD-10-CM | POA: Diagnosis not present

## 2020-11-16 DIAGNOSIS — E079 Disorder of thyroid, unspecified: Secondary | ICD-10-CM | POA: Diagnosis not present

## 2020-11-16 DIAGNOSIS — R82998 Other abnormal findings in urine: Secondary | ICD-10-CM | POA: Diagnosis not present

## 2020-11-16 DIAGNOSIS — N186 End stage renal disease: Secondary | ICD-10-CM | POA: Diagnosis not present

## 2020-11-16 DIAGNOSIS — Z992 Dependence on renal dialysis: Secondary | ICD-10-CM | POA: Diagnosis not present

## 2020-11-16 DIAGNOSIS — Z79899 Other long term (current) drug therapy: Secondary | ICD-10-CM | POA: Diagnosis not present

## 2020-11-16 DIAGNOSIS — N2581 Secondary hyperparathyroidism of renal origin: Secondary | ICD-10-CM | POA: Diagnosis not present

## 2020-11-16 DIAGNOSIS — E8770 Fluid overload, unspecified: Secondary | ICD-10-CM | POA: Diagnosis not present

## 2020-11-16 DIAGNOSIS — D509 Iron deficiency anemia, unspecified: Secondary | ICD-10-CM | POA: Diagnosis not present

## 2020-11-16 DIAGNOSIS — E440 Moderate protein-calorie malnutrition: Secondary | ICD-10-CM | POA: Diagnosis not present

## 2020-11-18 ENCOUNTER — Other Ambulatory Visit: Payer: Self-pay | Admitting: Allergy & Immunology

## 2020-11-19 DIAGNOSIS — E079 Disorder of thyroid, unspecified: Secondary | ICD-10-CM | POA: Diagnosis not present

## 2020-11-19 DIAGNOSIS — Z992 Dependence on renal dialysis: Secondary | ICD-10-CM | POA: Diagnosis not present

## 2020-11-19 DIAGNOSIS — E8770 Fluid overload, unspecified: Secondary | ICD-10-CM | POA: Diagnosis not present

## 2020-11-19 DIAGNOSIS — D631 Anemia in chronic kidney disease: Secondary | ICD-10-CM | POA: Diagnosis not present

## 2020-11-19 DIAGNOSIS — E876 Hypokalemia: Secondary | ICD-10-CM | POA: Diagnosis not present

## 2020-11-19 DIAGNOSIS — E440 Moderate protein-calorie malnutrition: Secondary | ICD-10-CM | POA: Diagnosis not present

## 2020-11-19 DIAGNOSIS — Z79899 Other long term (current) drug therapy: Secondary | ICD-10-CM | POA: Diagnosis not present

## 2020-11-19 DIAGNOSIS — N2589 Other disorders resulting from impaired renal tubular function: Secondary | ICD-10-CM | POA: Diagnosis not present

## 2020-11-19 DIAGNOSIS — R82998 Other abnormal findings in urine: Secondary | ICD-10-CM | POA: Diagnosis not present

## 2020-11-19 DIAGNOSIS — N186 End stage renal disease: Secondary | ICD-10-CM | POA: Diagnosis not present

## 2020-11-19 DIAGNOSIS — N2581 Secondary hyperparathyroidism of renal origin: Secondary | ICD-10-CM | POA: Diagnosis not present

## 2020-11-19 DIAGNOSIS — D509 Iron deficiency anemia, unspecified: Secondary | ICD-10-CM | POA: Diagnosis not present

## 2020-11-20 DIAGNOSIS — E440 Moderate protein-calorie malnutrition: Secondary | ICD-10-CM | POA: Diagnosis not present

## 2020-11-20 DIAGNOSIS — E876 Hypokalemia: Secondary | ICD-10-CM | POA: Diagnosis not present

## 2020-11-20 DIAGNOSIS — E079 Disorder of thyroid, unspecified: Secondary | ICD-10-CM | POA: Diagnosis not present

## 2020-11-20 DIAGNOSIS — N186 End stage renal disease: Secondary | ICD-10-CM | POA: Diagnosis not present

## 2020-11-20 DIAGNOSIS — Z79899 Other long term (current) drug therapy: Secondary | ICD-10-CM | POA: Diagnosis not present

## 2020-11-20 DIAGNOSIS — N2581 Secondary hyperparathyroidism of renal origin: Secondary | ICD-10-CM | POA: Diagnosis not present

## 2020-11-20 DIAGNOSIS — R82998 Other abnormal findings in urine: Secondary | ICD-10-CM | POA: Diagnosis not present

## 2020-11-20 DIAGNOSIS — N2589 Other disorders resulting from impaired renal tubular function: Secondary | ICD-10-CM | POA: Diagnosis not present

## 2020-11-20 DIAGNOSIS — Z992 Dependence on renal dialysis: Secondary | ICD-10-CM | POA: Diagnosis not present

## 2020-11-20 DIAGNOSIS — D509 Iron deficiency anemia, unspecified: Secondary | ICD-10-CM | POA: Diagnosis not present

## 2020-11-20 DIAGNOSIS — D631 Anemia in chronic kidney disease: Secondary | ICD-10-CM | POA: Diagnosis not present

## 2020-11-20 DIAGNOSIS — E8770 Fluid overload, unspecified: Secondary | ICD-10-CM | POA: Diagnosis not present

## 2020-11-22 DIAGNOSIS — N2581 Secondary hyperparathyroidism of renal origin: Secondary | ICD-10-CM | POA: Diagnosis not present

## 2020-11-22 DIAGNOSIS — Z79899 Other long term (current) drug therapy: Secondary | ICD-10-CM | POA: Diagnosis not present

## 2020-11-22 DIAGNOSIS — D509 Iron deficiency anemia, unspecified: Secondary | ICD-10-CM | POA: Diagnosis not present

## 2020-11-22 DIAGNOSIS — E876 Hypokalemia: Secondary | ICD-10-CM | POA: Diagnosis not present

## 2020-11-22 DIAGNOSIS — E440 Moderate protein-calorie malnutrition: Secondary | ICD-10-CM | POA: Diagnosis not present

## 2020-11-22 DIAGNOSIS — Z992 Dependence on renal dialysis: Secondary | ICD-10-CM | POA: Diagnosis not present

## 2020-11-22 DIAGNOSIS — E8770 Fluid overload, unspecified: Secondary | ICD-10-CM | POA: Diagnosis not present

## 2020-11-22 DIAGNOSIS — E079 Disorder of thyroid, unspecified: Secondary | ICD-10-CM | POA: Diagnosis not present

## 2020-11-22 DIAGNOSIS — R82998 Other abnormal findings in urine: Secondary | ICD-10-CM | POA: Diagnosis not present

## 2020-11-22 DIAGNOSIS — N2589 Other disorders resulting from impaired renal tubular function: Secondary | ICD-10-CM | POA: Diagnosis not present

## 2020-11-22 DIAGNOSIS — D631 Anemia in chronic kidney disease: Secondary | ICD-10-CM | POA: Diagnosis not present

## 2020-11-22 DIAGNOSIS — N186 End stage renal disease: Secondary | ICD-10-CM | POA: Diagnosis not present

## 2020-11-23 DIAGNOSIS — Z992 Dependence on renal dialysis: Secondary | ICD-10-CM | POA: Diagnosis not present

## 2020-11-23 DIAGNOSIS — D631 Anemia in chronic kidney disease: Secondary | ICD-10-CM | POA: Diagnosis not present

## 2020-11-23 DIAGNOSIS — D509 Iron deficiency anemia, unspecified: Secondary | ICD-10-CM | POA: Diagnosis not present

## 2020-11-23 DIAGNOSIS — E440 Moderate protein-calorie malnutrition: Secondary | ICD-10-CM | POA: Diagnosis not present

## 2020-11-23 DIAGNOSIS — E8770 Fluid overload, unspecified: Secondary | ICD-10-CM | POA: Diagnosis not present

## 2020-11-23 DIAGNOSIS — N186 End stage renal disease: Secondary | ICD-10-CM | POA: Diagnosis not present

## 2020-11-23 DIAGNOSIS — N2589 Other disorders resulting from impaired renal tubular function: Secondary | ICD-10-CM | POA: Diagnosis not present

## 2020-11-23 DIAGNOSIS — N2581 Secondary hyperparathyroidism of renal origin: Secondary | ICD-10-CM | POA: Diagnosis not present

## 2020-11-23 DIAGNOSIS — R82998 Other abnormal findings in urine: Secondary | ICD-10-CM | POA: Diagnosis not present

## 2020-11-23 DIAGNOSIS — E876 Hypokalemia: Secondary | ICD-10-CM | POA: Diagnosis not present

## 2020-11-23 DIAGNOSIS — E079 Disorder of thyroid, unspecified: Secondary | ICD-10-CM | POA: Diagnosis not present

## 2020-11-23 DIAGNOSIS — Z79899 Other long term (current) drug therapy: Secondary | ICD-10-CM | POA: Diagnosis not present

## 2020-11-25 ENCOUNTER — Other Ambulatory Visit: Payer: Self-pay

## 2020-11-25 ENCOUNTER — Ambulatory Visit
Admission: RE | Admit: 2020-11-25 | Discharge: 2020-11-25 | Disposition: A | Discharge status: Home / Self Care | Payer: Medicare Other | Source: Ambulatory Visit | Attending: Nephrology | Admitting: Nephrology

## 2020-11-25 DIAGNOSIS — K7689 Other specified diseases of liver: Secondary | ICD-10-CM | POA: Diagnosis not present

## 2020-11-25 DIAGNOSIS — N281 Cyst of kidney, acquired: Secondary | ICD-10-CM

## 2020-11-25 IMAGING — MR MR ABDOMEN W/O CM
8 series · 48 of 48 positions shown · non-contrast
Comparison: CT abdomen pelvis, [DATE], [DATE]

CLINICAL DATA: Renal cyst

EXAM:
MRI ABDOMEN WITHOUT CONTRAST
TECHNIQUE: Multiplanar multisequence MR imaging was performed without the
administration of intravenous contrast.

[Series 4: T2 · coronal · 5.0mm · 1.56mm/px · 5 of 46 slices shown (1 of 2)]
[im 1/46]
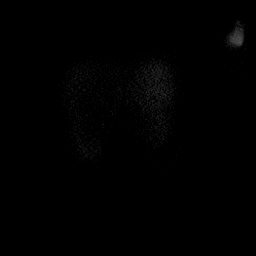
[im 12/46]
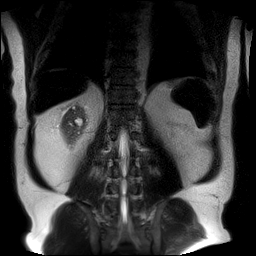
[im 23/46]
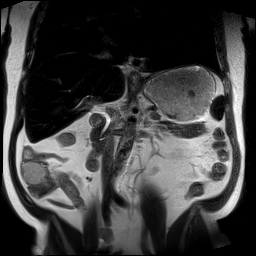
[im 34/46]
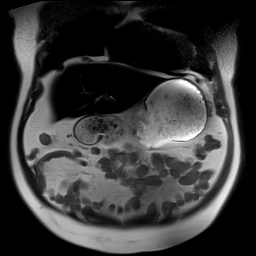
[im 46/46]
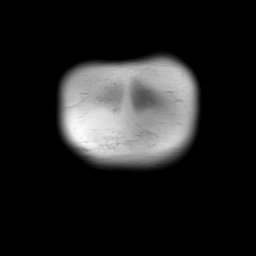

[Series 5: T1 · axial · 3.0mm · 1.19mm/px · z∈[+27,+240]mm · 13 of 144 slices shown]
[im 1/144]
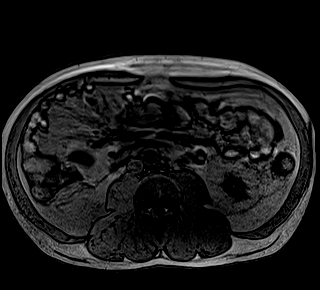
[im 12/144]
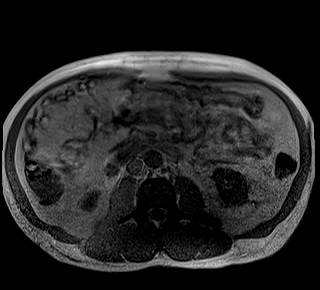
[im 24/144]
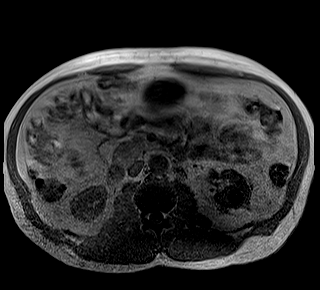
[im 36/144]
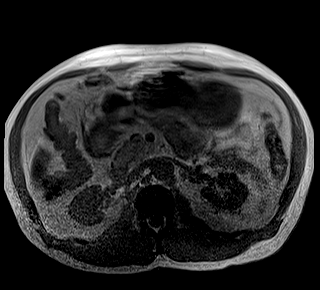
[im 48/144]
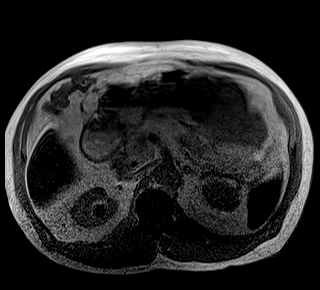
[im 60/144]
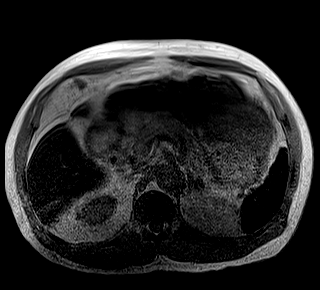
[im 72/144]
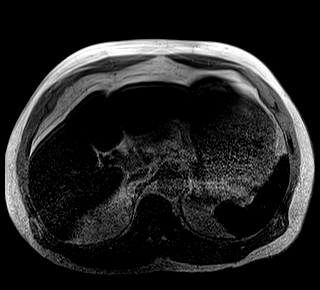
[im 84/144]
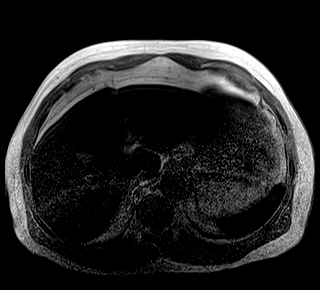
[im 96/144]
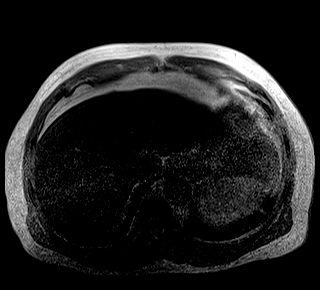
[im 108/144]
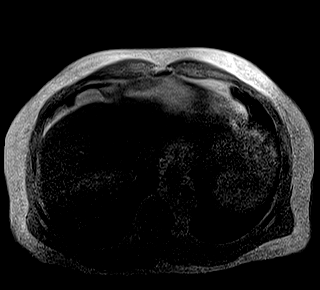
[im 120/144]
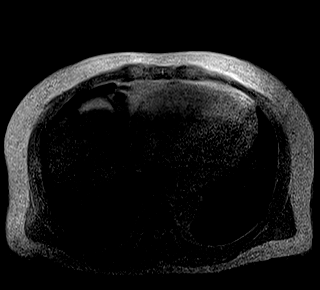
[im 132/144]
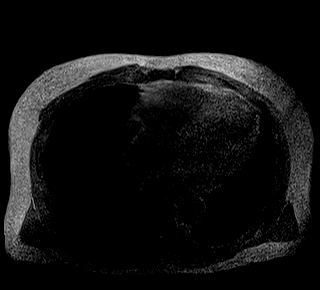
[im 144/144]
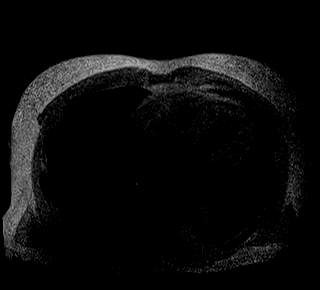

[Series 6: DWI · axial · 5.0mm · 1.42mm/px · z∈[+14,+236]mm · 10 of 114 slices shown (1 of 2)]
[im 1/114]
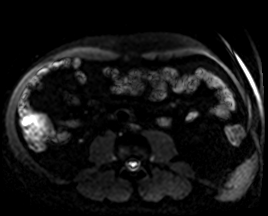
[im 13/114]
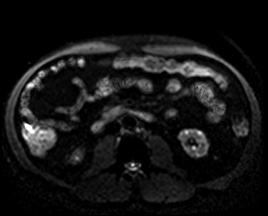
[im 26/114]
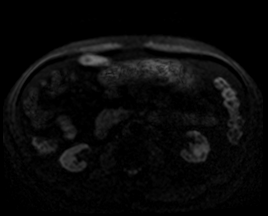
[im 38/114]
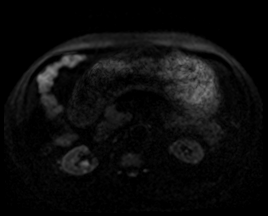
[im 51/114]
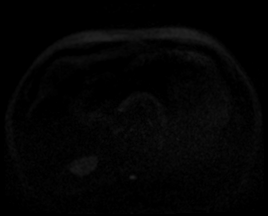
[im 63/114]
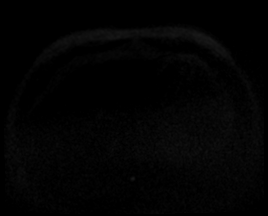
[im 76/114]
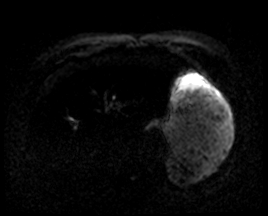
[im 88/114]
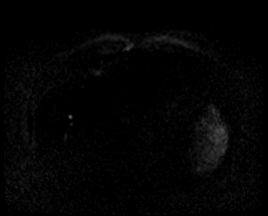
[im 101/114]
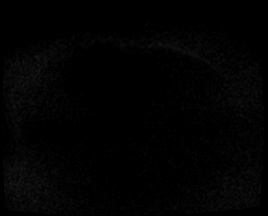
[im 114/114]
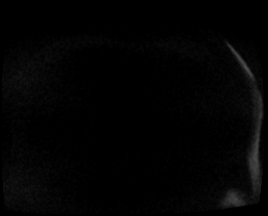

[Series 7: DWI · axial · 5.0mm · 1.42mm/px · z∈[+14,+236]mm · 3 of 38 slices shown (2 of 2)]
[im 1/38]
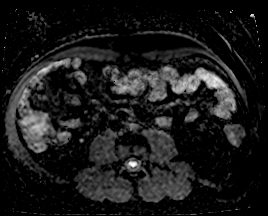
[im 19/38]
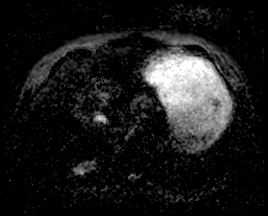
[im 38/38]
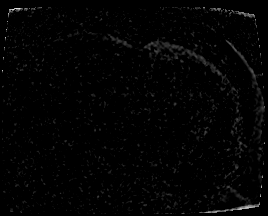

[Series 8: T2 · axial · 5.0mm · 1.48mm/px · z∈[+14,+236]mm · 3 of 38 slices shown (2 of 2)]
[im 1/38]
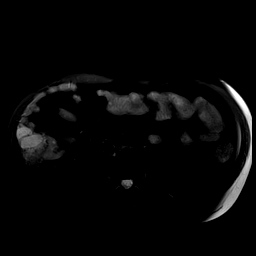
[im 19/38]
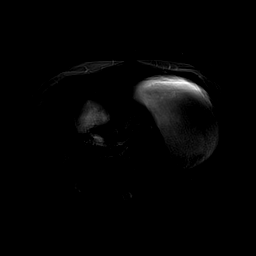
[im 38/38]
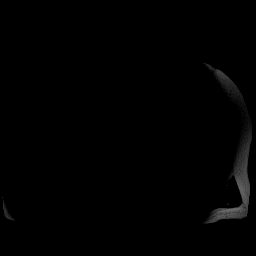

[Series 9: bSSFP · axial · 5.0mm · 1.25mm/px · z∈[+14,+236]mm · 3 of 38 slices shown (1 of 2)]
[im 1/38]
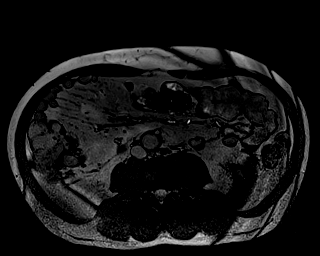
[im 19/38]
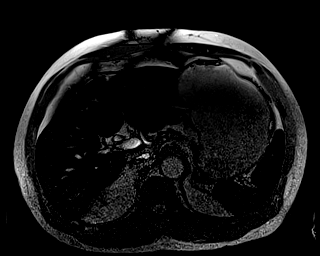
[im 38/38]
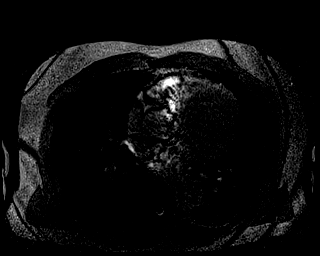

[Series 10: T1 dynamic · coronal · 3.0mm · 1.25mm/px · 8 of 96 slices shown]
[im 1/96]
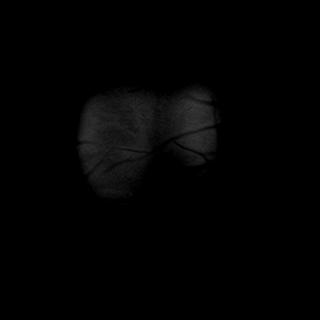
[im 14/96]
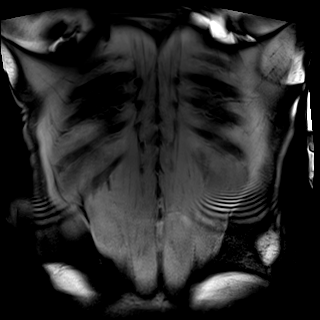
[im 28/96]
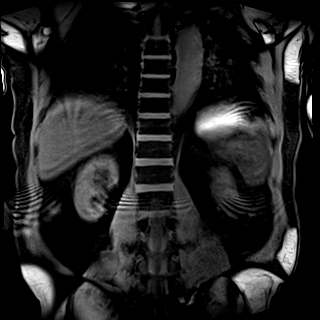
[im 41/96]
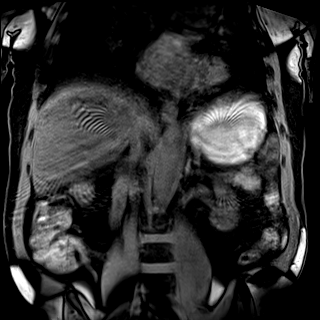
[im 55/96]
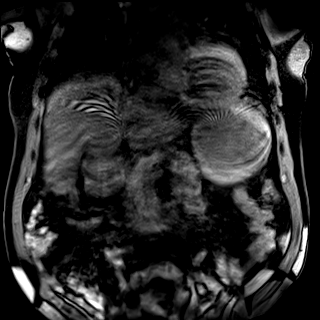
[im 68/96]
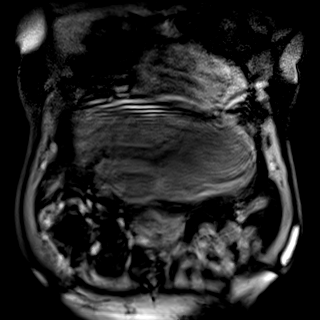
[im 82/96]
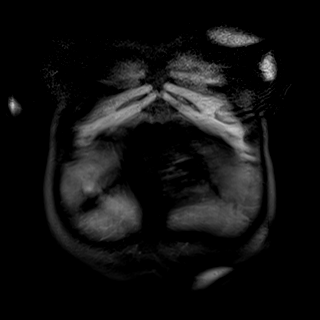
[im 96/96]
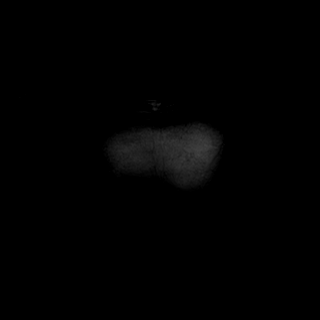

[Series 11: bSSFP · coronal · 5.0mm · 1.56mm/px · 3 of 39 slices shown (2 of 2)]
[im 1/39]
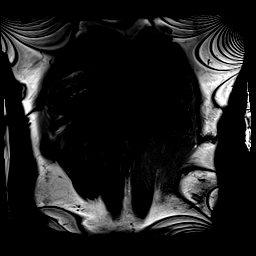
[im 20/39]
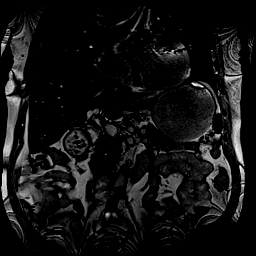
[im 39/39]
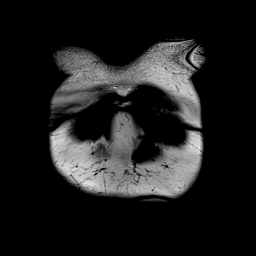

[48 of 48 positions shown; findings below may reference images not displayed]

FINDINGS: Lower chest: No acute findings.

Hepatobiliary: No mass or other parenchymal abnormality identified.
Hepatic iron deposition evidenced by decreased signal intensity of
the liver parenchyma and splenic parenchyma on in phase imaging.

Pancreas: No mass, inflammatory changes, or other parenchymal
abnormality identified.

Spleen: Splenic iron deposition. Within normal limits in size and
appearance.

Adrenals/Urinary Tract: No obvious solid masses identified. There
are multiple bilateral fluid signal lesions of the kidneys, notably
a 1.7 cm lesion of the posterior midportion of the left kidney and a
1.5 cm lesion of the superior pole of the right kidney. There are
numerous additional subcentimeter fluid signal lesions, too small to
characterize although likely additional tiny cysts. No evidence of
hydronephrosis.

Stomach/Bowel: Visualized portions within the abdomen are
unremarkable.

Vascular/Lymphatic: No pathologically enlarged lymph nodes
identified. No abdominal aortic aneurysm demonstrated.

Other:  None.

Musculoskeletal: No suspicious bone lesions identified.
IMPRESSION: 1. There are multiple bilateral fluid signal lesions of the kidneys,
notably a 1.7 cm lesion of the posterior midportion of the left
kidney and a 1.5 cm lesion of the superior pole of the right kidney.
There are numerous additional subcentimeter fluid signal lesions,
too small to characterize although likely additional tiny cysts.
These lesions are not obviously changed compared to prior
noncontrast CT examinations and there are no suspicious features by
noncontrast MR to suggest need for additional follow-up or
characterization. Please note that in general, noncontrast MR is of
limited value for abdominal organ lesion characterization and
contrast is strongly preferred for this purpose.
2. Hepatic and splenic iron deposition, suggesting secondary
hemochromatosis. Correlate for history of transfusion or other
possible etiology.

## 2020-11-25 NOTE — Patient Instructions (Signed)
- Continue medication, monitor blood pressure at home. Continue DASH diet. Reminder to go to the ER if any CP, SOB, nausea, dizziness, severe HA, changes vision/speech, left arm numbness and tingling and jaw pain.    Hypothyroidism  Hypothyroidism is when the thyroid gland does not make enough of certain hormones (it is underactive). The thyroid gland is a small gland located in the lower front part of the neck, just in front of the windpipe (trachea). This gland makes hormones that help control how the body uses food for energy (metabolism) as well as how the heart and brain function. These hormones also play a role in keeping your bones strong. When the thyroid is underactive, it produces too little of the hormones thyroxine (T4) and triiodothyronine (T3). What are the causes? This condition may be caused by:  Hashimoto's disease. This is a disease in which the body's disease-fighting system (immune system) attacks the thyroid gland. This is the most common cause.  Viral infections.  Pregnancy.  Certain medicines.  Birth defects.  Past radiation treatments to the head or neck for cancer.  Past treatment with radioactive iodine.  Past exposure to radiation in the environment.  Past surgical removal of part or all of the thyroid.  Problems with a gland in the center of the brain (pituitary gland).  Lack of enough iodine in the diet. What increases the risk? You are more likely to develop this condition if:  You are male.  You have a family history of thyroid conditions.  You use a medicine called lithium.  You take medicines that affect the immune system (immunosuppressants). What are the signs or symptoms? Symptoms of this condition include:  Feeling as though you have no energy (lethargy).  Not being able to tolerate cold.  Weight gain that is not explained by a change in diet or exercise habits.  Lack of appetite.  Dry skin.  Coarse hair.  Menstrual  irregularity.  Slowing of thought processes.  Constipation.  Sadness or depression. How is this diagnosed? This condition may be diagnosed based on:  Your symptoms, your medical history, and a physical exam.  Blood tests. You may also have imaging tests, such as an ultrasound or MRI. How is this treated? This condition is treated with medicine that replaces the thyroid hormones that your body does not make. After you begin treatment, it may take several weeks for symptoms to go away. Follow these instructions at home:  Take over-the-counter and prescription medicines only as told by your health care provider.  If you start taking any new medicines, tell your health care provider.  Keep all follow-up visits as told by your health care provider. This is important. ? As your condition improves, your dosage of thyroid hormone medicine may change. ? You will need to have blood tests regularly so that your health care provider can monitor your condition. Contact a health care provider if:  Your symptoms do not get better with treatment.  You are taking thyroid hormone replacement medicine and you: ? Sweat a lot. ? Have tremors. ? Feel anxious. ? Lose weight rapidly. ? Cannot tolerate heat. ? Have emotional swings. ? Have diarrhea. ? Feel weak. Get help right away if you have:  Chest pain.  An irregular heartbeat.  A rapid heartbeat.  Difficulty breathing. Summary  Hypothyroidism is when the thyroid gland does not make enough of certain hormones (it is underactive).  When the thyroid is underactive, it produces too little of the hormones thyroxine (  T4) and triiodothyronine (T3).  The most common cause is Hashimoto's disease, a disease in which the body's disease-fighting system (immune system) attacks the thyroid gland. The condition can also be caused by viral infections, medicine, pregnancy, or past radiation treatment to the head or neck.  Symptoms may include  weight gain, dry skin, constipation, feeling as though you do not have energy, and not being able to tolerate cold.  This condition is treated with medicine to replace the thyroid hormones that your body does not make. This information is not intended to replace advice given to you by your health care provider. Make sure you discuss any questions you have with your health care provider. Document Revised: 07/13/2020 Document Reviewed: 06/28/2020 Elsevier Patient Education  2021 [place] Your Hypertension Hypertension, also called high blood pressure, is when the force of the blood pressing against the walls of the arteries is too strong. Arteries are blood vessels that carry blood from your heart throughout your body. Hypertension forces the heart to work harder to pump blood and may cause the arteries to become narrow or stiff. Understanding blood pressure readings Your personal target blood pressure may vary depending on your medical conditions, your age, and other factors. A blood pressure reading includes a higher number over a lower number. Ideally, your blood pressure should be below 120/80. You should know that:  The first, or top, number is called the systolic pressure. It is a measure of the pressure in your arteries as your heart beats.  The second, or bottom number, is called the diastolic pressure. It is a measure of the pressure in your arteries as the heart relaxes. Blood pressure is classified into four stages. Based on your blood pressure reading, your health care provider may use the following stages to determine what type of treatment you need, if any. Systolic pressure and diastolic pressure are measured in a unit called mmHg. Normal  Systolic pressure: below 428.  Diastolic pressure: below 80. Elevated  Systolic pressure: 768-115.  Diastolic pressure: below 80. Hypertension stage 1  Systolic pressure: 726-203.  Diastolic pressure: 55-97. Hypertension  stage 2  Systolic pressure: 416 or above.  Diastolic pressure: 90 or above. How can this condition affect me? Managing your hypertension is an important responsibility. Over time, hypertension can damage the arteries and decrease blood flow to important parts of the body, including the brain, heart, and kidneys. Having untreated or uncontrolled hypertension can lead to:  A heart attack.  A stroke.  A weakened blood vessel (aneurysm).  Heart failure.  Kidney damage.  Eye damage.  Metabolic syndrome.  Memory and concentration problems.  Vascular dementia. What actions can I take to manage this condition? Hypertension can be managed by making lifestyle changes and possibly by taking medicines. Your health care provider will help you make a plan to bring your blood pressure within a normal range. Nutrition  Eat a diet that is high in fiber and potassium, and low in salt (sodium), added sugar, and fat. An example eating plan is called the Dietary Approaches to Stop Hypertension (DASH) diet. To eat this way: ? Eat plenty of fresh fruits and vegetables. Try to fill one-half of your plate at each meal with fruits and vegetables. ? Eat whole grains, such as whole-wheat pasta, brown rice, or whole-grain bread. Fill about one-fourth of your plate with whole grains. ? Eat low-fat dairy products. ? Avoid fatty cuts of meat, processed or cured meats, and poultry with skin. Fill about  one-fourth of your plate with lean proteins such as fish, chicken without skin, beans, eggs, and tofu. ? Avoid pre-made and processed foods. These tend to be higher in sodium, added sugar, and fat.  Reduce your daily sodium intake. Most people with hypertension should eat less than 1,500 mg of sodium a day.   Lifestyle  Work with your health care provider to maintain a healthy body weight or to lose weight. Ask what an ideal weight is for you.  Get at least 30 minutes of exercise that causes your heart to beat  faster (aerobic exercise) most days of the week. Activities may include walking, swimming, or biking.  Include exercise to strengthen your muscles (resistance exercise), such as weight lifting, as part of your weekly exercise routine. Try to do these types of exercises for 30 minutes at least 3 days a week.  Do not use any products that contain nicotine or tobacco, such as cigarettes, e-cigarettes, and chewing tobacco. If you need help quitting, ask your health care provider.  Control any long-term (chronic) conditions you have, such as high cholesterol or diabetes.  Identify your sources of stress and find ways to manage stress. This may include meditation, deep breathing, or making time for fun activities.   Alcohol use  Do not drink alcohol if: ? Your health care provider tells you not to drink. ? You are pregnant, may be pregnant, or are planning to become pregnant.  If you drink alcohol: ? Limit how much you use to:  0-1 drink a day for women.  0-2 drinks a day for men. ? Be aware of how much alcohol is in your drink. In the [place] one drink equals one 12 oz bottle of beer (355 mL), one 5 oz glass of wine (148 mL), or one 1 oz glass of hard liquor (44 mL). Medicines Your health care provider may prescribe medicine if lifestyle changes are not enough to get your blood pressure under control and if:  Your systolic blood pressure is 130 or higher.  Your diastolic blood pressure is 80 or higher. Take medicines only as told by your health care provider. Follow the directions carefully. Blood pressure medicines must be taken as told by your health care provider. The medicine does not work as well when you skip doses. Skipping doses also puts you at risk for problems. Monitoring Before you monitor your blood pressure:  Do not smoke, drink caffeinated beverages, or exercise within 30 minutes before taking a measurement.  Use the bathroom and empty your bladder (urinate).  Sit quietly  for at least 5 minutes before taking measurements. Monitor your blood pressure at home as told by your health care provider. To do this:  Sit with your back straight and supported.  Place your feet flat on the floor. Do not cross your legs.  Support your arm on a flat surface, such as a table. Make sure your upper arm is at heart level.  Each time you measure, take two or three readings one minute apart and record the results. You may also need to have your blood pressure checked regularly by your health care provider.   General information  Talk with your health care provider about your diet, exercise habits, and other lifestyle factors that may be contributing to hypertension.  Review all the medicines you take with your health care provider because there may be side effects or interactions.  Keep all visits as told by your health care provider. Your health care provider  can help you create and adjust your plan for managing your high blood pressure. Where to find more information  National Heart, Lung, and Blood Institute: https://wilson-eaton.com/  American Heart Association: www.heart.org Contact a health care provider if:  You think you are having a reaction to medicines you have taken.  You have repeated (recurrent) headaches.  You feel dizzy.  You have swelling in your ankles.  You have trouble with your vision. Get help right away if:  You develop a severe headache or confusion.  You have unusual weakness or numbness, or you feel faint.  You have severe pain in your chest or abdomen.  You vomit repeatedly.  You have trouble breathing. These symptoms may represent a serious problem that is an emergency. Do not wait to see if the symptoms will go away. Get medical help right away. Call your local emergency services (911 in the [place] Do not drive yourself to the hospital. Summary  Hypertension is when the force of blood pumping through your arteries is too strong. If  this condition is not controlled, it may put you at risk for serious complications.  Your personal target blood pressure may vary depending on your medical conditions, your age, and other factors. For most people, a normal blood pressure is less than 120/80.  Hypertension is managed by lifestyle changes, medicines, or both.  Lifestyle changes to help manage hypertension include losing weight, eating a healthy, low-sodium diet, exercising more, stopping smoking, and limiting alcohol. This information is not intended to replace advice given to you by your health care provider. Make sure you discuss any questions you have with your health care provider. Document Revised: 11/18/2019 Document Reviewed: 09/13/2019 Elsevier Patient Education  2021 [employer].

## 2020-11-26 DIAGNOSIS — N2589 Other disorders resulting from impaired renal tubular function: Secondary | ICD-10-CM | POA: Diagnosis not present

## 2020-11-26 DIAGNOSIS — R82998 Other abnormal findings in urine: Secondary | ICD-10-CM | POA: Diagnosis not present

## 2020-11-26 DIAGNOSIS — D509 Iron deficiency anemia, unspecified: Secondary | ICD-10-CM | POA: Diagnosis not present

## 2020-11-26 DIAGNOSIS — N186 End stage renal disease: Secondary | ICD-10-CM | POA: Diagnosis not present

## 2020-11-26 DIAGNOSIS — I129 Hypertensive chronic kidney disease with stage 1 through stage 4 chronic kidney disease, or unspecified chronic kidney disease: Secondary | ICD-10-CM | POA: Diagnosis not present

## 2020-11-26 DIAGNOSIS — D631 Anemia in chronic kidney disease: Secondary | ICD-10-CM | POA: Diagnosis not present

## 2020-11-26 DIAGNOSIS — Z992 Dependence on renal dialysis: Secondary | ICD-10-CM | POA: Diagnosis not present

## 2020-11-26 DIAGNOSIS — Z79899 Other long term (current) drug therapy: Secondary | ICD-10-CM | POA: Diagnosis not present

## 2020-11-26 DIAGNOSIS — E440 Moderate protein-calorie malnutrition: Secondary | ICD-10-CM | POA: Diagnosis not present

## 2020-11-26 DIAGNOSIS — E876 Hypokalemia: Secondary | ICD-10-CM | POA: Diagnosis not present

## 2020-11-26 DIAGNOSIS — E8770 Fluid overload, unspecified: Secondary | ICD-10-CM | POA: Diagnosis not present

## 2020-11-26 DIAGNOSIS — E079 Disorder of thyroid, unspecified: Secondary | ICD-10-CM | POA: Diagnosis not present

## 2020-11-26 DIAGNOSIS — N2581 Secondary hyperparathyroidism of renal origin: Secondary | ICD-10-CM | POA: Diagnosis not present

## 2020-11-27 DIAGNOSIS — E8770 Fluid overload, unspecified: Secondary | ICD-10-CM | POA: Diagnosis not present

## 2020-11-27 DIAGNOSIS — Z79899 Other long term (current) drug therapy: Secondary | ICD-10-CM | POA: Diagnosis not present

## 2020-11-27 DIAGNOSIS — Z992 Dependence on renal dialysis: Secondary | ICD-10-CM | POA: Diagnosis not present

## 2020-11-27 DIAGNOSIS — K7689 Other specified diseases of liver: Secondary | ICD-10-CM | POA: Diagnosis not present

## 2020-11-27 DIAGNOSIS — D509 Iron deficiency anemia, unspecified: Secondary | ICD-10-CM | POA: Diagnosis not present

## 2020-11-27 DIAGNOSIS — Z4931 Encounter for adequacy testing for hemodialysis: Secondary | ICD-10-CM | POA: Diagnosis not present

## 2020-11-27 DIAGNOSIS — D631 Anemia in chronic kidney disease: Secondary | ICD-10-CM | POA: Diagnosis not present

## 2020-11-27 DIAGNOSIS — E876 Hypokalemia: Secondary | ICD-10-CM | POA: Diagnosis not present

## 2020-11-27 DIAGNOSIS — R82998 Other abnormal findings in urine: Secondary | ICD-10-CM | POA: Diagnosis not present

## 2020-11-27 DIAGNOSIS — N2581 Secondary hyperparathyroidism of renal origin: Secondary | ICD-10-CM | POA: Diagnosis not present

## 2020-11-27 DIAGNOSIS — N186 End stage renal disease: Secondary | ICD-10-CM | POA: Diagnosis not present

## 2020-11-29 ENCOUNTER — Encounter: Payer: Self-pay | Admitting: Allergy & Immunology

## 2020-11-29 ENCOUNTER — Other Ambulatory Visit: Payer: Self-pay

## 2020-11-29 ENCOUNTER — Ambulatory Visit (INDEPENDENT_AMBULATORY_CARE_PROVIDER_SITE_OTHER): Payer: Medicare Other | Admitting: Allergy & Immunology

## 2020-11-29 VITALS — BP 110/78 | HR 97 | Temp 97.6°F | Resp 18 | Ht 70.0 in | Wt 194.0 lb

## 2020-11-29 DIAGNOSIS — J4530 Mild persistent asthma, uncomplicated: Secondary | ICD-10-CM | POA: Diagnosis not present

## 2020-11-29 DIAGNOSIS — J3089 Other allergic rhinitis: Secondary | ICD-10-CM | POA: Diagnosis not present

## 2020-11-29 DIAGNOSIS — D509 Iron deficiency anemia, unspecified: Secondary | ICD-10-CM | POA: Diagnosis not present

## 2020-11-29 DIAGNOSIS — Z992 Dependence on renal dialysis: Secondary | ICD-10-CM | POA: Diagnosis not present

## 2020-11-29 DIAGNOSIS — E8770 Fluid overload, unspecified: Secondary | ICD-10-CM | POA: Diagnosis not present

## 2020-11-29 DIAGNOSIS — N186 End stage renal disease: Secondary | ICD-10-CM | POA: Diagnosis not present

## 2020-11-29 DIAGNOSIS — N2581 Secondary hyperparathyroidism of renal origin: Secondary | ICD-10-CM | POA: Diagnosis not present

## 2020-11-29 DIAGNOSIS — Z79899 Other long term (current) drug therapy: Secondary | ICD-10-CM | POA: Diagnosis not present

## 2020-11-29 DIAGNOSIS — E876 Hypokalemia: Secondary | ICD-10-CM | POA: Diagnosis not present

## 2020-11-29 DIAGNOSIS — D631 Anemia in chronic kidney disease: Secondary | ICD-10-CM | POA: Diagnosis not present

## 2020-11-29 DIAGNOSIS — K7689 Other specified diseases of liver: Secondary | ICD-10-CM | POA: Diagnosis not present

## 2020-11-29 DIAGNOSIS — Z4931 Encounter for adequacy testing for hemodialysis: Secondary | ICD-10-CM | POA: Diagnosis not present

## 2020-11-29 DIAGNOSIS — R82998 Other abnormal findings in urine: Secondary | ICD-10-CM | POA: Diagnosis not present

## 2020-11-29 NOTE — Patient Instructions (Addendum)
1. Mild persistent asthma, uncomplicated - Lung testing not done today. - Daily controller medication(s): NOTHING - Prior to physical activity: albuterol 2 puffs 10-15 minutes before physical activity. - Rescue medications: albuterol 4 puffs every 4-6 hours as needed - Changes during respiratory infections or worsening symptoms: Increase Flovent 171mg to 2 puffs twice daily for TWO WEEKS. - Asthma control goals:  * Full participation in all desired activities (may need albuterol before activity) * Albuterol use two time or less a week on average (not counting use with activity) * Cough interfering with sleep two time or less a month * Oral steroids no more than once a year * No hospitalizations  2. Perennial allergic conjunctivitis of both eyes - Continue with Pataday one drop per eye twice daily. - Continue with Systane eye drops as needed. - Continue with the nose sprays as needed.   3. Return in about 1 year (around 11/29/2021).    Please inform [place] any Emergency Department visits, hospitalizations, or changes in symptoms. Call [place]efore going to the ED for breathing or allergy symptoms since we might be able to fit you in for a sick visit. Feel free to contact u[place]anytime with any questions, problems, or concerns.  It was a pleasure to see you again today!  Websites that have reliable patient information: 1. American Academy of Asthma, Allergy, and Immunology: www.aaaai.org 2. Food Allergy Research and Education (FARE): foodallergy.org 3. Mothers of Asthmatics: http://www.asthmacommunitynetwork.org 4. American College of Allergy, Asthma, and Immunology: www.acaai.org   COVID-19 Vaccine Information can be found at: [email or web address] questions related to vaccine distribution or appointments, please email [email or web address] or call [phone]     "Like" [facility] Facebook and Instagram for our latest updates!        Make sure you are registered to vote! If you have moved or changed any of your contact information, you will need to get this updated before voting!  In some cases, you MAY be able to register to vote online: [email or web address]

## 2020-11-29 NOTE — Progress Notes (Signed)
FOLLOW UP  Date of Service/Encounter:  11/29/20   Assessment:   Mild persistent asthma, uncomplicated  Perennial allergic rhinitis (dust mites)  HIV - on HAART  ESRD - on transplant list   Plan/Recommendations:   1. Mild persistent asthma, uncomplicated - Lung testing not done today. - Daily controller medication(s): NOTHING - Prior to physical activity: albuterol 2 puffs 10-15 minutes before physical activity. - Rescue medications: albuterol 4 puffs every 4-6 hours as needed - Changes during respiratory infections or worsening symptoms: Increase Flovent 151mg to 2 puffs twice daily for TWO WEEKS. - Asthma control goals:  * Full participation in all desired activities (may need albuterol before activity) * Albuterol use two time or less a week on average (not counting use with activity) * Cough interfering with sleep two time or less a month * Oral steroids no more than once a year * No hospitalizations  2. Perennial allergic conjunctivitis of both eyes - Continue with Pataday one drop per eye twice daily. - Continue with Systane eye drops as needed. - Continue with the nose sprays as needed.   3. Return in about 1 year (around 11/29/2021).   Subjective:   KPravin PerezperezMarch is a 55y.o. male presenting today for follow up of No chief complaint on file.   KMeldon HanzlikMarch has a history of the following: Patient Active Problem List   Diagnosis Date Noted  . Lower abdominal pain 06/26/2020  . Pulsatile tinnitus of right ear 11/21/2019  . Kidney failure 11/21/2019  . Acute diastolic CHF (congestive heart failure) ([facility] 11/21/2019  . Chills (without fever) 11/07/2019  . Hypercalcemia 10/17/2019  . Dependence on renal dialysis (HP[place] 06/06/2019  . Coagulation defect, unspecified ([facility] 05/23/2019  . Iron deficiency anemia, unspecified 05/16/2019  . Anemia in other chronic diseases classified elsewhere 05/13/2019  . Arteriovenous fistula, acquired ([facility] 05/13/2019  .  Body mass index (BMI) 28.0-28.9, adult 05/13/2019  . Crohn's disease of small intestine without complications ([facility] [facility] 016/10/930 . Encounter for screening for respiratory tuberculosis 05/13/2019  . Gout, unspecified 05/13/2019  . Nausea 05/13/2019  . Other fatigue 05/13/2019  . Secondary hyperparathyroidism of renal origin ([facility] [place] 05/13/2019  . End stage renal disease ([facility] 05/13/2019  . Chronic diastolic (congestive) heart failure ([facility] 05/13/2019  . Chronic kidney disease, unspecified 05/13/2019  . Obstructive sleep apnea 05/13/2019  . Discomfort of right ear 02/18/2019  . Hypokalemia 06/13/2018  . Abnormal CT scan, small bowel   . Perennial allergic rhinitis with a predominant nonallergic component 02/09/2018  . Allergic conjunctivitis 02/09/2018  . Mild intermittent asthma 02/09/2018  . Atherosclerosis 12/09/2017  . Hypothyroidism 11/19/2017  . Mixed hyperlipidemia 10/13/2017  . Personal history of gout 10/13/2017  . S/P cholecystectomy 10/13/2017  . Congestive heart failure with LV diastolic dysfunction, NYHA class 1 ([facility] 10/13/2017  . Hypothyroidism (acquired) 10/13/2017  . 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 SOB (shortness of breath) 06/07/2017  . Arthritis, gouty 01/27/2017  . Chronic diastolic CHF (congestive heart failure) (HC[facility] 01/06/2017  . OSA (obstructive sleep apnea) 01/06/2017  . Abnormal electrocardiogram (ECG) (EKG) 10/31/2016  . Chest pain 10/09/2016  . HIV disease ([facility] 10/09/2016  . Hypertensive heart disease with CHF (congestive heart failure) ([facility] 10/09/2016  . Left-sided low back pain without sciatica 05/29/2015  . NYHA class 2 heart failure with preserved ejection fraction (HP[facility] [place] 05/15/2015  . PAH (pulmonary artery hypertension) ([facility] 05/08/2015  . Severe mitral regurgitation 05/04/2015  . Prolonged Q-T interval on ECG 05/04/2015  History obtained from: chart review and patient.  Kallum is a 55 y.o. male presenting for a follow up visit.  He  was last seen in January 2021.  At that time, he was continued on albuterol as needed with Flovent added during respiratory flares.  For his allergic rhinitis, he was continued on nasal saline lavage as needed as well as Flonase and Astelin as needed.  Since the last visit, he has mostly done well. He has been very well controlled since the last visit. Dry mouth has improved since last time.   Asthma/Respiratory Symptom History: The last time that he used albuterol was 3-4 months ago. He has not needed prednisone for his breathing in years. He does wake up coughing and has had a dry cough for around two months.   Allergic Rhinitis Symptom History: He has not had any sneezing or rhinitis symptoms. He is only using things as needed for his symptoms.    He is on the kidney transplant list for [facility]. He is also trying to get involved in [place]. He is dialyzed four times per week. His niece comes to trake his blood pressure and helps to hook him up to his machine (he has a fistula in place and does not do peritoneal dialysis).   He is not on any ACE inhibitors. He is currently on amlodipine as well as carvedilol and hydralazine.  He has been down here from [place] for four years. He is not a fan of the recent snow storms. He could do without them entirely.   Otherwise, there have been no changes to his past medical history, surgical history, family history, or social history.    Review of Systems  Constitutional: Negative.  Negative for chills, fever, malaise/fatigue and weight loss.  HENT: Positive for congestion. Negative for ear discharge, ear pain and sinus pain.   Eyes: Negative for pain, discharge and redness.  Respiratory: Negative for cough, sputum production, shortness of breath and wheezing.   Cardiovascular: Negative.  Negative for chest pain and palpitations.  Gastrointestinal: Negative for abdominal pain, constipation, diarrhea, heartburn, nausea and vomiting.   Skin: Negative.  Negative for itching and rash.  Neurological: Negative for dizziness and headaches.  Endo/Heme/Allergies: Positive for environmental allergies. Does not bruise/bleed easily.       Objective:   Blood pressure 110/78, pulse 97, temperature 97.6 F (36.4 C), temperature source Temporal, resp. rate 18, height 5' 10"  (1.778 m), weight 194 lb (88 kg), SpO2 97 %. Body mass index is 27.84 kg/m.   Physical Exam:  Physical Exam Constitutional:      Appearance: He is well-developed.     Comments: Very pleasant male. Cooperative with the exam. He seems rather tired today.   HENT:     Head: Normocephalic and atraumatic.     Right Ear: Tympanic membrane, ear canal and external ear normal. No drainage, swelling or tenderness. Tympanic membrane is not injected, scarred, erythematous, retracted or bulging.     Left Ear: Tympanic membrane, ear canal and external ear normal. No drainage, swelling or tenderness. Tympanic membrane is not injected, scarred, erythematous, retracted or bulging.     Nose: No nasal deformity, septal deviation, mucosal edema, rhinorrhea or epistaxis.     Right Turbinates: Enlarged and swollen.     Left Turbinates: Enlarged and swollen.     Right Sinus: No maxillary sinus tenderness or frontal sinus tenderness.     Left Sinus: No maxillary sinus tenderness or frontal sinus tenderness.  Mouth/Throat:     Mouth: Oropharynx is clear and moist. Mucous membranes are not pale and not dry.     Pharynx: Uvula midline.  Eyes:     General:        Right eye: No discharge.        Left eye: No discharge.     Extraocular Movements: EOM normal.     Conjunctiva/sclera: Conjunctivae normal.     Right eye: Right conjunctiva is not injected. No chemosis.    Left eye: Left conjunctiva is not injected. No chemosis.    Pupils: Pupils are equal, round, and reactive to light.  Cardiovascular:     Rate and Rhythm: Normal rate and regular rhythm.     Heart sounds: Normal  heart sounds.  Pulmonary:     Effort: Pulmonary effort is normal. No tachypnea, accessory muscle usage or respiratory distress.     Breath sounds: Normal breath sounds. No wheezing, rhonchi or rales.     Comments: Moving air well in all lung fields. No increased work of breathing noted. Chest:     Chest wall: No tenderness.  Abdominal:     Tenderness: There is no abdominal tenderness. There is no guarding or rebound.  Lymphadenopathy:     Head:     Right side of head: No submandibular, tonsillar or occipital adenopathy.     Left side of head: No submandibular, tonsillar or occipital adenopathy.     Cervical: No cervical adenopathy.  Skin:    General: Skin is warm.     Capillary Refill: Capillary refill takes less than 2 seconds.     Coloration: Skin is not pale.     Findings: No abrasion, erythema, petechiae or rash. Rash is not papular, urticarial or vesicular.     Comments: No eczematous or urticarial lesions noted.   Neurological:     Mental Status: He is alert.  Psychiatric:        Mood and Affect: Mood and affect normal.        Behavior: Behavior is cooperative.      Diagnostic studies: none      Salvatore Marvel, MD  [facility]

## 2020-11-30 DIAGNOSIS — D509 Iron deficiency anemia, unspecified: Secondary | ICD-10-CM | POA: Diagnosis not present

## 2020-11-30 DIAGNOSIS — D631 Anemia in chronic kidney disease: Secondary | ICD-10-CM | POA: Diagnosis not present

## 2020-11-30 DIAGNOSIS — Z4931 Encounter for adequacy testing for hemodialysis: Secondary | ICD-10-CM | POA: Diagnosis not present

## 2020-11-30 DIAGNOSIS — N186 End stage renal disease: Secondary | ICD-10-CM | POA: Diagnosis not present

## 2020-11-30 DIAGNOSIS — E876 Hypokalemia: Secondary | ICD-10-CM | POA: Diagnosis not present

## 2020-11-30 DIAGNOSIS — K7689 Other specified diseases of liver: Secondary | ICD-10-CM | POA: Diagnosis not present

## 2020-11-30 DIAGNOSIS — E8770 Fluid overload, unspecified: Secondary | ICD-10-CM | POA: Diagnosis not present

## 2020-11-30 DIAGNOSIS — Z79899 Other long term (current) drug therapy: Secondary | ICD-10-CM | POA: Diagnosis not present

## 2020-11-30 DIAGNOSIS — Z992 Dependence on renal dialysis: Secondary | ICD-10-CM | POA: Diagnosis not present

## 2020-11-30 DIAGNOSIS — R82998 Other abnormal findings in urine: Secondary | ICD-10-CM | POA: Diagnosis not present

## 2020-11-30 DIAGNOSIS — N2581 Secondary hyperparathyroidism of renal origin: Secondary | ICD-10-CM | POA: Diagnosis not present

## 2020-12-03 DIAGNOSIS — N186 End stage renal disease: Secondary | ICD-10-CM | POA: Diagnosis not present

## 2020-12-03 DIAGNOSIS — Z79899 Other long term (current) drug therapy: Secondary | ICD-10-CM | POA: Diagnosis not present

## 2020-12-03 DIAGNOSIS — Z992 Dependence on renal dialysis: Secondary | ICD-10-CM | POA: Diagnosis not present

## 2020-12-03 DIAGNOSIS — E876 Hypokalemia: Secondary | ICD-10-CM | POA: Diagnosis not present

## 2020-12-03 DIAGNOSIS — E8770 Fluid overload, unspecified: Secondary | ICD-10-CM | POA: Diagnosis not present

## 2020-12-03 DIAGNOSIS — D509 Iron deficiency anemia, unspecified: Secondary | ICD-10-CM | POA: Diagnosis not present

## 2020-12-03 DIAGNOSIS — Z4931 Encounter for adequacy testing for hemodialysis: Secondary | ICD-10-CM | POA: Diagnosis not present

## 2020-12-03 DIAGNOSIS — N2581 Secondary hyperparathyroidism of renal origin: Secondary | ICD-10-CM | POA: Diagnosis not present

## 2020-12-03 DIAGNOSIS — K7689 Other specified diseases of liver: Secondary | ICD-10-CM | POA: Diagnosis not present

## 2020-12-03 DIAGNOSIS — R82998 Other abnormal findings in urine: Secondary | ICD-10-CM | POA: Diagnosis not present

## 2020-12-03 DIAGNOSIS — D631 Anemia in chronic kidney disease: Secondary | ICD-10-CM | POA: Diagnosis not present

## 2020-12-04 DIAGNOSIS — Z4931 Encounter for adequacy testing for hemodialysis: Secondary | ICD-10-CM | POA: Diagnosis not present

## 2020-12-04 DIAGNOSIS — N186 End stage renal disease: Secondary | ICD-10-CM | POA: Diagnosis not present

## 2020-12-04 DIAGNOSIS — E8770 Fluid overload, unspecified: Secondary | ICD-10-CM | POA: Diagnosis not present

## 2020-12-04 DIAGNOSIS — E876 Hypokalemia: Secondary | ICD-10-CM | POA: Diagnosis not present

## 2020-12-04 DIAGNOSIS — N2581 Secondary hyperparathyroidism of renal origin: Secondary | ICD-10-CM | POA: Diagnosis not present

## 2020-12-04 DIAGNOSIS — Z992 Dependence on renal dialysis: Secondary | ICD-10-CM | POA: Diagnosis not present

## 2020-12-04 DIAGNOSIS — D631 Anemia in chronic kidney disease: Secondary | ICD-10-CM | POA: Diagnosis not present

## 2020-12-04 DIAGNOSIS — D509 Iron deficiency anemia, unspecified: Secondary | ICD-10-CM | POA: Diagnosis not present

## 2020-12-04 DIAGNOSIS — K7689 Other specified diseases of liver: Secondary | ICD-10-CM | POA: Diagnosis not present

## 2020-12-04 DIAGNOSIS — Z79899 Other long term (current) drug therapy: Secondary | ICD-10-CM | POA: Diagnosis not present

## 2020-12-04 DIAGNOSIS — R82998 Other abnormal findings in urine: Secondary | ICD-10-CM | POA: Diagnosis not present

## 2020-12-04 NOTE — Progress Notes (Signed)
HEART AND VASCULAR CENTER   MULTIDISCIPLINARY HEART VALVE TEAM  Date:  12/05/2020   ID:  Albert Eaton, DOB May 03, 1966, MRN [id number]  PCP:  [name], FNP   Chief Complaint  Patient presents with  . Shortness of Breath     HISTORY OF PRESENT ILLNESS: Albert Eaton is a 55 y.o. male who presents for evaluation of mitral regurgitation, referred by Truitt Merle, NP.   The patient has chronic diastolic heart failure, ESRD on home dialysis, hypertension, and nonobstructive CAD. The patient is here alone today. He does home dialysis 4 days/week since July of last year. His niece is present for this and accesses his AV fistula for dialysis sessions. He is listed for kidney transplant - reports that he has been on [facility]'s list for 2 years and is trying to get on [facility]'s list.  He has an appointment in [place] later this afternoon.  The patient is here alone today.  He reports that he is doing well at present.  He has had no recent problems with chest discomfort on exertion, shortness of breath on exertion, edema, orthopnea, or PND.  The patient was hospitalized in November 2021 with shortness of breath and febrile illness.  He was also felt to have acute diastolic heart failure with pulmonary edema.  The patient was treated with IV antibiotics and volume was managed with hemodialysis.  He recovered quickly and has not had further problems.  An echocardiogram during his hospitalization suggested severe eccentric mitral regurgitation.  He ultimately underwent transesophageal echo demonstrating moderately severe mitral regurgitation, Carpentier type I disease.  Past Medical History:  Diagnosis Date  . Anemia   . Aortic atherosclerosis ([place])   . Asthma   . Chronic diastolic CHF (congestive heart failure) ([place]) 01/06/2017   Echo 7/16 [facility] in [place], [name]) Mild AI, mild LAE, mild concentric LVH, EF 55, normal wall motion, mild to moderate MR, mild PI, RVSP 55 // Echo 10/09/16  ([facility]):  Moderate LVH, grade 2 diastolic dysfunction, mild MR, moderate LAE   . CKD (chronic kidney disease)    per pt, waiting on a kidney transplant in near future.  . Esophagitis   . Gout   . Hepatitis    Hep B  . History of nuclear stress test    a. Nuc study 7/16: no scar or ischemia, EF 42 // b. Nuc study 12/17: EF 48, ?small apical ischemia, Low Risk  . HIV (human immunodeficiency virus infection) ([place])   . HLD (hyperlipidemia)   . Hypertension   . Hypertensive heart disease with CHF (congestive heart failure) ([place]) 10/09/2016  . Mitral regurgitation   . OSA (obstructive sleep apnea)   . Post-operative nausea and vomiting    1 time as a child  . Sleep apnea    uses c-pap  . Wears glasses   . Wears partial dentures     Current Outpatient Medications  Medication Sig Dispense Refill  . albuterol (VENTOLIN HFA) 108 (90 Base) MCG/ACT inhaler INHALE 2 PUFFS INTO THE LUNGS EVERY 4 HOURS AS NEEDED FOR WHEEZING OR SHORTNESS OF BREATH 8.5 g 1  . allopurinol (ZYLOPRIM) 100 MG tablet TAKE 1 TABLET(100 MG) BY MOUTH TWICE DAILY 60 tablet 3  . amLODipine (NORVASC) 10 MG tablet Take 1 tablet (10 mg total) by mouth daily. 30 tablet 1  . Ascorbic Acid (VITAMIN C WITH ROSE HIPS) 1000 MG tablet Take 1,000 mg by mouth daily.    Marland Kitchen azelastine (ASTELIN) 0.1 % nasal spray  Place 2 sprays into both nostrils 2 (two) times daily as needed for rhinitis. Use in each nostril as directed 30 mL 5  . B Complex Vitamins (VITAMIN B-COMPLEX) TABS Take 1 tablet by mouth once a day  OTC    . B Complex-C-Zn-Folic Acid (DIALYVITE/ZINC) TABS Take 1 tablet by mouth every evening.     . bictegravir-emtricitabine-tenofovir AF (BIKTARVY) 50-200-25 MG TABS tablet Take 1 tablet by mouth daily. 30 tablet 6  . calcitRIOL (ROCALTROL) 0.5 MCG capsule Take 0.5 mcg by mouth daily.     . carvedilol (COREG) 25 MG tablet TAKE 1 TABLET BY MOUTH TWICE DAILY WITH FOOD 60 tablet 5  . doxycycline (VIBRA-TABS) 100 MG tablet Take 1 tablet  (100 mg total) by mouth 2 (two) times daily. 14 tablet 0  . famotidine (PEPCID) 20 MG tablet TAKE 1 TABLET(20 MG) BY MOUTH TWICE DAILY 180 tablet 3  . FLOVENT HFA 110 MCG/ACT inhaler INHALE 2 PUFFS INTO THE LUNGS TWICE DAILY 12 g 0  . gabapentin (NEURONTIN) 300 MG capsule TAKE 1 CAPSULE(300 MG) BY MOUTH THREE TIMES DAILY 90 capsule 3  . hydrALAZINE (APRESOLINE) 100 MG tablet Take 1 tablet (100 mg total) by mouth 3 (three) times daily. 270 tablet 0  . hyoscyamine (LEVSIN SL) 0.125 MG SL tablet Place 1-2 tablets (0.125-0.25 mg total) under the tongue every 8 (eight) hours as needed (lower abdominal pain, spasm). 90 tablet 2  . ipratropium (ATROVENT) 0.06 % nasal spray USE 2 SPRAYS IN EACH NOSTRIL 2 TO 3 TIMES DAILY AS NEEDED 15 mL 1  . lactulose (CHRONULAC) 10 GM/15ML solution Take 20 g by mouth daily as needed for moderate constipation.     Marland Kitchen levothyroxine (SYNTHROID) 25 MCG tablet TAKE 1 TABLET BY MOUTH DAILY BEFORE BREAKFAST 30 tablet 3  . lidocaine-prilocaine (EMLA) cream Apply 1 application topically See admin instructions. Apply to access site 1-2 hours before dialysis    . lubiprostone (AMITIZA) 24 MCG capsule Take 1 capsule (24 mcg total) by mouth 2 (two) times daily with a meal. NEEDS COLONOSCOPY FOR FURTHER REFILLS 60 capsule 0  . Methoxy PEG-Epoetin Beta (MIRCERA IJ) Inject into the skin.    . mupirocin ointment (BACTROBAN) 2 % Apply 1 application topically daily as needed (apply to port after dialysis).     . Olopatadine HCl (PAZEO) 0.7 % SOLN Place 1 drop into both eyes daily. 2.5 mL 5  . sevelamer carbonate (RENVELA) 800 MG tablet Take 1,600 mg by mouth 3 (three) times daily.    Marland Kitchen Spacer/Aero-Holding Chambers DEVI 1 Device by Does not apply route 2 (two) times a day. 1 each 1  . traMADol (ULTRAM) 50 MG tablet Take 1 tablet (50 mg total) by mouth every 6 (six) hours as needed for moderate pain. 15 tablet 0  . Turmeric 1053 MG TABS Take by mouth.    . Turmeric 500 MG CAPS Take 500 mg by  mouth daily.    . VELPHORO 500 MG chewable tablet Chew 500 mg by mouth 3 (three) times daily.    . Vitamin D, Ergocalciferol, (DRISDOL) 1.25 MG (50000 UNIT) CAPS capsule TAKE 1 CAPSULE BY MOUTH EVERY 7 DAYS 12 capsule 1  . vitamin E (VITAMIN E) 180 MG (400 UNITS) capsule Take 400 Units by mouth daily.     Marland Kitchen zinc gluconate 50 MG tablet Take 50 mg by mouth at bedtime.      No current facility-administered medications for this visit.    ALLERGIES:   Ace inhibitors and  Lisinopril   SOCIAL HISTORY:  The patient  reports that he quit smoking about 22 years ago. His smoking use included cigarettes. He has never used smokeless tobacco. He reports current alcohol use. He reports that he does not use drugs.   FAMILY HISTORY:  The patient's family history includes Heart attack (age of onset: 58) in his maternal grandmother; Heart failure in his mother; Hypertension in his brother, sister, and sister; Multiple sclerosis in his sister; Scoliosis in an other family member.   REVIEW OF SYSTEMS:  Positive for fatigue.   All other systems are reviewed and negative.   PHYSICAL EXAM: VS:  BP (!) 130/100   Pulse (!) 132   Ht 5' 10"  (1.778 m)   Wt 204 lb (92.5 kg)   SpO2 95%   BMI 29.27 kg/m  , BMI Body mass index is 29.27 kg/m. GEN: Well nourished, well developed, in no acute distress.  At the time of my examination, the patient's heart rate is down to 86 bpm. HEENT: normal Neck: No JVD. carotids 2+ without bruits or masses Cardiac: The heart is RRR with a 2/6 systolic murmur heard at the right upper sternal border, left lower sternal border, and apex.  No edema. Pedal pulses 2+ = bilaterally  Respiratory:  clear to auscultation bilaterally GI: soft, nontender, nondistended, + BS MS: no deformity or atrophy Skin: warm and dry, no rash Neuro:  Strength and sensation are intact Psych: euthymic mood, full affect  EKG:  EKG from today reviewed and demonstrates atrial tachycardia 132 bpm  RECENT  LABS: 05/01/2020: TSH 2.290 08/30/2020: ALT 15; B Natriuretic Peptide 1,331.1; Magnesium 1.8 09/04/2020: Hemoglobin 9.2; Platelets 217 09/28/2020: BUN 49; Creatinine, Ser 8.18; Potassium 4.2; Sodium 138  06/27/2020: Cholesterol 180; HDL 37; LDL Cholesterol (Calc) 113; Total CHOL/HDL Ratio 4.9; Triglycerides 183   CrCl cannot be calculated (Patient's most recent lab result is older than the maximum 21 days allowed.).   Wt Readings from Last 3 Encounters:  12/05/20 204 lb (92.5 kg)  11/29/20 194 lb (88 kg)  10/29/20 203 lb (92.1 kg)     CARDIAC STUDIES:  Echo:  IMPRESSIONS    1. Left ventricular ejection fraction, by estimation, is 60 to 65%. The  left ventricle has normal function. The left ventricle has no regional  wall motion abnormalities. The left ventricular internal cavity size was  moderately dilated. There is mild  left ventricular hypertrophy.  2. Right ventricular systolic function is normal. The right ventricular  size is normal. There is moderately elevated pulmonary artery systolic  pressure.  3. Left atrial size was severely dilated.  4. Right atrial size was mildly dilated.  5. MR jet is eccentrically directed along posterior wall of LA Would  recomm TEE to further define mechanism. COmpared to echo from APril 2021,  MR appears more severe.. The mitral valve is grossly normal. Severe mitral  valve regurgitation.  6. The aortic valve is normal in structure. Aortic valve regurgitation is  trivial.  7. Aortic dilatation noted. There is mild dilatation of the aortic root,  measuring 40 mm.  8. The inferior vena cava is normal in size with greater than 50%  respiratory variability, suggesting right atrial pressure of 3 mmHg.    TEE: IMPRESSIONS    1. Left ventricular ejection fraction, by estimation, is 50 to 55%. The  left ventricle has normal function. The left ventricle has no regional  wall motion abnormalities.  2. Right ventricular systolic  function is normal. The right  ventricular  size is normal.  3. Left atrial size was mildly dilated..  4. MV annulus measures 37 mm. The mitral leaflets appear normal but  coaptation not complete There are multiple jets of MR, the most prominent  is posteriorly directed, wrapping around posterior aspect of LA Appears  moderate to moderately severe  (Carpentier Type I). The mitral valve is normal in structure. Moderate to  severe mitral valve regurgitation.  5. The aortic valve is normal in structure. Aortic valve regurgitation is  mild.  6. Agitated saline contrast bubble study was negative, with no evidence  of any interatrial shunt.    Cardiac/coronary CTA:  IMPRESSION: 1. Coronary calcium score of 437. This was 98th percentile for age and sex matched control.  2.  Normal coronary origin with left dominance.  3.  Mild atherosclerosis.  CAD RADS 2.  4. Recommend preventive pharmacotherapy and risk factor modification.  5.  This study has been sent for FFR flow analysis.  ASSESSMENT AND PLAN: 1.  Mitral regurgitation, nonrheumatic: I have personally reviewed the patient's 2D echo and TEE images.  I have also reviewed his TEE with our structural heart imaging specialist.  The patient appears to have mitral regurgitation that is likely secondary with diastolic dysfunction, mild LV dysfunction, and Carpentier class I dysfunction.  His mitral regurgitation appears moderate in severity based on close review of his TEE images.  I think continued volume management with hemodialysis is appropriate.  I do not think he requires further intervention on his mitral valve at present and specifically does not require further evaluation for transcatheter edge-to-edge mitral valve repair.  He should continue with ongoing echo surveillance and I will arrange an echocardiogram at the time of his return office visit in about 3 months. 2.  Chronic diastolic heart failure: Continue volume management  with dialysis, antihypertensive therapy with amlodipine, hydralazine, and carvedilol.  Appears compensated at present. 3.  Atrial tachycardia: Patient asymptomatic.  Heart rate back in the 80s at the time of my evaluation.  EKG suggest atrial tachycardia.  Recommend 14-day ZIO monitor.  Continue beta-blockade with carvedilol 25 mg twice daily. 4.  Hypertension with hypertensive heart disease: Management as outlined with ongoing hemodialysis, multidrug therapy as detailed above.  For follow-up I would like the patient to see Richardson Dopp in about 3 months with an echocardiogram preceding that visit.  Deatra James 12/05/2020 9:53 AM     [address] [place] [id number]  [phone] (office) [phone] (fax)

## 2020-12-05 ENCOUNTER — Ambulatory Visit (INDEPENDENT_AMBULATORY_CARE_PROVIDER_SITE_OTHER): Payer: Medicare Other

## 2020-12-05 ENCOUNTER — Other Ambulatory Visit: Payer: Self-pay

## 2020-12-05 ENCOUNTER — Ambulatory Visit (INDEPENDENT_AMBULATORY_CARE_PROVIDER_SITE_OTHER): Payer: Medicare Other | Admitting: Cardiovascular Disease

## 2020-12-05 ENCOUNTER — Encounter: Payer: Self-pay | Admitting: *Deleted

## 2020-12-05 ENCOUNTER — Other Ambulatory Visit: Payer: Self-pay | Admitting: Cardiovascular Disease

## 2020-12-05 ENCOUNTER — Encounter: Payer: Self-pay | Admitting: Cardiovascular Disease

## 2020-12-05 VITALS — BP 130/100 | HR 132 | Ht 70.0 in | Wt 204.0 lb

## 2020-12-05 DIAGNOSIS — I5032 Chronic diastolic (congestive) heart failure: Secondary | ICD-10-CM

## 2020-12-05 DIAGNOSIS — R9431 Abnormal electrocardiogram [ECG] [EKG]: Secondary | ICD-10-CM | POA: Diagnosis not present

## 2020-12-05 DIAGNOSIS — R0602 Shortness of breath: Secondary | ICD-10-CM

## 2020-12-05 DIAGNOSIS — I493 Ventricular premature depolarization: Secondary | ICD-10-CM

## 2020-12-05 DIAGNOSIS — I471 Supraventricular tachycardia: Secondary | ICD-10-CM | POA: Diagnosis not present

## 2020-12-05 DIAGNOSIS — I340 Nonrheumatic mitral (valve) insufficiency: Secondary | ICD-10-CM

## 2020-12-05 DIAGNOSIS — Z01818 Encounter for other preprocedural examination: Secondary | ICD-10-CM | POA: Diagnosis not present

## 2020-12-05 DIAGNOSIS — I491 Atrial premature depolarization: Secondary | ICD-10-CM

## 2020-12-05 NOTE — Patient Instructions (Signed)
Medication Instructions:  Your provider recommends that you continue on your current medications as directed. Please refer to the Current Medication list given to you today.   *If you need a refill on your cardiac medications before your next appointment, please call your pharmacy*  Testing/Procedures: Dr. [name] recommends you wear a HEART MONITOR for 2 weeks.  Your physician has requested that you have an echocardiogram. Echocardiography is a painless test that uses sound waves to create images of your heart. It provides your doctor with information about the size and shape of your heart and how well your heart's chambers and valves are working. This procedure takes approximately one hour. There are no restrictions for this procedure.  Follow-Up: At [facility], you and your health needs are our priority.  As part of our continuing mission to provide you with exceptional heart care, we have created designated Provider Care Teams.  These Care Teams include your primary Cardiologist (physician) and Advanced Practice Providers (APPs -  Physician Assistants and Nurse Practitioners) who all work together to provide you with the care you need, when you need it. Your next appointment:   3 month(s) The format for your next appointment:   In Person Provider:  Evaristo Bury- Long Term Monitor Instructions   Your physician has requested you wear your ZIO patch monitor 14 days.   This is a single patch monitor.  [employer] supplies one patch monitor per enrollment.  Additional stickers are not available.   Please do not apply patch if you will be having a Nuclear Stress Test, Echocardiogram, Cardiac CT, MRI, or Chest Xray during the time frame you would be wearing the monitor. The patch cannot be worn during these tests.  You cannot remove and re-apply the ZIO XT patch monitor.   Your ZIO patch monitor will be sent USPS Priority mail from [facility] directly to your home address. The  monitor may also be mailed to a PO BOX if home delivery is not available.   It may take 3-5 days to receive your monitor after you have been enrolled.   Once you have received you monitor, please review enclosed instructions.  Your monitor has already been registered assigning a specific monitor serial # to you.   Applying the monitor   Shave hair from upper left chest.   Hold abrader disc by orange tab.  Rub abrader in 40 strokes over left upper chest as indicated in your monitor instructions.   Clean area with 4 enclosed alcohol pads .  Use all pads to assure are is cleaned thoroughly.  Let dry.   Apply patch as indicated in monitor instructions.  Patch will be place under collarbone on left side of chest with arrow pointing upward.   Rub patch adhesive wings for 2 minutes.Remove white label marked "1".  Remove white label marked "2".  Rub patch adhesive wings for 2 additional minutes.   While looking in a mirror, press and release button in center of patch.  A small green light will flash 3-4 times .  This will be your only indicator the monitor has been turned on.     Do not shower for the first 24 hours.  You may shower after the first 24 hours.   Press button if you feel a symptom. You will hear a small click.  Record Date, Time and Symptom in the Patient Log Book.   When you are ready to remove patch, follow instructions on last 2 pages  of Patient Log Book.  Stick patch monitor onto last page of Patient Log Book.   Place Patient Log Book in [place] box.  Use locking tab on box and tape box closed securely.  The [employer] has [employer] on it.  Please place in mailbox as soon as possible.  Your physician should have your test results approximately 7 days after the monitor has been mailed back to [facility].   Call [facility] at [phone] if you have questions regarding your ZIO XT patch monitor.  Call them immediately if you see an orange light  blinking on your monitor.   If your monitor falls off in less than 4 days contact our Monitor department at [phone].  If your monitor becomes loose or falls off after 4 days call [employer] at [phone] for suggestions on securing your monitor.

## 2020-12-05 NOTE — Progress Notes (Signed)
Patient ID: Albert Eaton, male   DOB: 12-11-1965, 55 y.o.   MRN: [id number] Patient enrolled for [employer] to ship a 14 day ZIO XT long term holter monitor to his home.

## 2020-12-06 DIAGNOSIS — D631 Anemia in chronic kidney disease: Secondary | ICD-10-CM | POA: Diagnosis not present

## 2020-12-06 DIAGNOSIS — E8770 Fluid overload, unspecified: Secondary | ICD-10-CM | POA: Diagnosis not present

## 2020-12-06 DIAGNOSIS — N186 End stage renal disease: Secondary | ICD-10-CM | POA: Diagnosis not present

## 2020-12-06 DIAGNOSIS — N2581 Secondary hyperparathyroidism of renal origin: Secondary | ICD-10-CM | POA: Diagnosis not present

## 2020-12-06 DIAGNOSIS — D509 Iron deficiency anemia, unspecified: Secondary | ICD-10-CM | POA: Diagnosis not present

## 2020-12-06 DIAGNOSIS — Z992 Dependence on renal dialysis: Secondary | ICD-10-CM | POA: Diagnosis not present

## 2020-12-06 DIAGNOSIS — Z79899 Other long term (current) drug therapy: Secondary | ICD-10-CM | POA: Diagnosis not present

## 2020-12-06 DIAGNOSIS — E876 Hypokalemia: Secondary | ICD-10-CM | POA: Diagnosis not present

## 2020-12-06 DIAGNOSIS — R82998 Other abnormal findings in urine: Secondary | ICD-10-CM | POA: Diagnosis not present

## 2020-12-06 DIAGNOSIS — K7689 Other specified diseases of liver: Secondary | ICD-10-CM | POA: Diagnosis not present

## 2020-12-06 DIAGNOSIS — Z4931 Encounter for adequacy testing for hemodialysis: Secondary | ICD-10-CM | POA: Diagnosis not present

## 2020-12-07 DIAGNOSIS — Z992 Dependence on renal dialysis: Secondary | ICD-10-CM | POA: Diagnosis not present

## 2020-12-07 DIAGNOSIS — K7689 Other specified diseases of liver: Secondary | ICD-10-CM | POA: Diagnosis not present

## 2020-12-07 DIAGNOSIS — Z4931 Encounter for adequacy testing for hemodialysis: Secondary | ICD-10-CM | POA: Diagnosis not present

## 2020-12-07 DIAGNOSIS — N2581 Secondary hyperparathyroidism of renal origin: Secondary | ICD-10-CM | POA: Diagnosis not present

## 2020-12-07 DIAGNOSIS — N186 End stage renal disease: Secondary | ICD-10-CM | POA: Diagnosis not present

## 2020-12-07 DIAGNOSIS — D631 Anemia in chronic kidney disease: Secondary | ICD-10-CM | POA: Diagnosis not present

## 2020-12-07 DIAGNOSIS — R82998 Other abnormal findings in urine: Secondary | ICD-10-CM | POA: Diagnosis not present

## 2020-12-07 DIAGNOSIS — D509 Iron deficiency anemia, unspecified: Secondary | ICD-10-CM | POA: Diagnosis not present

## 2020-12-07 DIAGNOSIS — E876 Hypokalemia: Secondary | ICD-10-CM | POA: Diagnosis not present

## 2020-12-07 DIAGNOSIS — Z79899 Other long term (current) drug therapy: Secondary | ICD-10-CM | POA: Diagnosis not present

## 2020-12-07 DIAGNOSIS — E8770 Fluid overload, unspecified: Secondary | ICD-10-CM | POA: Diagnosis not present

## 2020-12-08 DIAGNOSIS — I340 Nonrheumatic mitral (valve) insufficiency: Secondary | ICD-10-CM

## 2020-12-08 DIAGNOSIS — I493 Ventricular premature depolarization: Secondary | ICD-10-CM

## 2020-12-08 DIAGNOSIS — R0602 Shortness of breath: Secondary | ICD-10-CM

## 2020-12-08 DIAGNOSIS — I5032 Chronic diastolic (congestive) heart failure: Secondary | ICD-10-CM | POA: Diagnosis not present

## 2020-12-08 DIAGNOSIS — R9431 Abnormal electrocardiogram [ECG] [EKG]: Secondary | ICD-10-CM

## 2020-12-08 DIAGNOSIS — I491 Atrial premature depolarization: Secondary | ICD-10-CM

## 2020-12-10 ENCOUNTER — Other Ambulatory Visit: Payer: Self-pay | Admitting: Internal Medicine

## 2020-12-10 ENCOUNTER — Other Ambulatory Visit: Payer: Self-pay | Admitting: Allergy & Immunology

## 2020-12-10 DIAGNOSIS — K7689 Other specified diseases of liver: Secondary | ICD-10-CM | POA: Diagnosis not present

## 2020-12-10 DIAGNOSIS — E8770 Fluid overload, unspecified: Secondary | ICD-10-CM | POA: Diagnosis not present

## 2020-12-10 DIAGNOSIS — N2581 Secondary hyperparathyroidism of renal origin: Secondary | ICD-10-CM | POA: Diagnosis not present

## 2020-12-10 DIAGNOSIS — Z4931 Encounter for adequacy testing for hemodialysis: Secondary | ICD-10-CM | POA: Diagnosis not present

## 2020-12-10 DIAGNOSIS — Z992 Dependence on renal dialysis: Secondary | ICD-10-CM | POA: Diagnosis not present

## 2020-12-10 DIAGNOSIS — E876 Hypokalemia: Secondary | ICD-10-CM | POA: Diagnosis not present

## 2020-12-10 DIAGNOSIS — R82998 Other abnormal findings in urine: Secondary | ICD-10-CM | POA: Diagnosis not present

## 2020-12-10 DIAGNOSIS — D631 Anemia in chronic kidney disease: Secondary | ICD-10-CM | POA: Diagnosis not present

## 2020-12-10 DIAGNOSIS — N186 End stage renal disease: Secondary | ICD-10-CM | POA: Diagnosis not present

## 2020-12-10 DIAGNOSIS — D509 Iron deficiency anemia, unspecified: Secondary | ICD-10-CM | POA: Diagnosis not present

## 2020-12-10 DIAGNOSIS — Z79899 Other long term (current) drug therapy: Secondary | ICD-10-CM | POA: Diagnosis not present

## 2020-12-11 DIAGNOSIS — Z4931 Encounter for adequacy testing for hemodialysis: Secondary | ICD-10-CM | POA: Diagnosis not present

## 2020-12-11 DIAGNOSIS — Z992 Dependence on renal dialysis: Secondary | ICD-10-CM | POA: Diagnosis not present

## 2020-12-11 DIAGNOSIS — D631 Anemia in chronic kidney disease: Secondary | ICD-10-CM | POA: Diagnosis not present

## 2020-12-11 DIAGNOSIS — D509 Iron deficiency anemia, unspecified: Secondary | ICD-10-CM | POA: Diagnosis not present

## 2020-12-11 DIAGNOSIS — K7689 Other specified diseases of liver: Secondary | ICD-10-CM | POA: Diagnosis not present

## 2020-12-11 DIAGNOSIS — E876 Hypokalemia: Secondary | ICD-10-CM | POA: Diagnosis not present

## 2020-12-11 DIAGNOSIS — Z79899 Other long term (current) drug therapy: Secondary | ICD-10-CM | POA: Diagnosis not present

## 2020-12-11 DIAGNOSIS — R82998 Other abnormal findings in urine: Secondary | ICD-10-CM | POA: Diagnosis not present

## 2020-12-11 DIAGNOSIS — E8770 Fluid overload, unspecified: Secondary | ICD-10-CM | POA: Diagnosis not present

## 2020-12-11 DIAGNOSIS — N2581 Secondary hyperparathyroidism of renal origin: Secondary | ICD-10-CM | POA: Diagnosis not present

## 2020-12-11 DIAGNOSIS — N186 End stage renal disease: Secondary | ICD-10-CM | POA: Diagnosis not present

## 2020-12-13 DIAGNOSIS — Z79899 Other long term (current) drug therapy: Secondary | ICD-10-CM | POA: Diagnosis not present

## 2020-12-13 DIAGNOSIS — Z992 Dependence on renal dialysis: Secondary | ICD-10-CM | POA: Diagnosis not present

## 2020-12-13 DIAGNOSIS — D509 Iron deficiency anemia, unspecified: Secondary | ICD-10-CM | POA: Diagnosis not present

## 2020-12-13 DIAGNOSIS — N2581 Secondary hyperparathyroidism of renal origin: Secondary | ICD-10-CM | POA: Diagnosis not present

## 2020-12-13 DIAGNOSIS — N186 End stage renal disease: Secondary | ICD-10-CM | POA: Diagnosis not present

## 2020-12-13 DIAGNOSIS — D631 Anemia in chronic kidney disease: Secondary | ICD-10-CM | POA: Diagnosis not present

## 2020-12-13 DIAGNOSIS — E876 Hypokalemia: Secondary | ICD-10-CM | POA: Diagnosis not present

## 2020-12-13 DIAGNOSIS — Z4931 Encounter for adequacy testing for hemodialysis: Secondary | ICD-10-CM | POA: Diagnosis not present

## 2020-12-13 DIAGNOSIS — K7689 Other specified diseases of liver: Secondary | ICD-10-CM | POA: Diagnosis not present

## 2020-12-13 DIAGNOSIS — R82998 Other abnormal findings in urine: Secondary | ICD-10-CM | POA: Diagnosis not present

## 2020-12-13 DIAGNOSIS — E8770 Fluid overload, unspecified: Secondary | ICD-10-CM | POA: Diagnosis not present

## 2020-12-14 DIAGNOSIS — R82998 Other abnormal findings in urine: Secondary | ICD-10-CM | POA: Diagnosis not present

## 2020-12-14 DIAGNOSIS — N186 End stage renal disease: Secondary | ICD-10-CM | POA: Diagnosis not present

## 2020-12-14 DIAGNOSIS — N2581 Secondary hyperparathyroidism of renal origin: Secondary | ICD-10-CM | POA: Diagnosis not present

## 2020-12-14 DIAGNOSIS — D509 Iron deficiency anemia, unspecified: Secondary | ICD-10-CM | POA: Diagnosis not present

## 2020-12-14 DIAGNOSIS — Z992 Dependence on renal dialysis: Secondary | ICD-10-CM | POA: Diagnosis not present

## 2020-12-14 DIAGNOSIS — Z4931 Encounter for adequacy testing for hemodialysis: Secondary | ICD-10-CM | POA: Diagnosis not present

## 2020-12-14 DIAGNOSIS — E876 Hypokalemia: Secondary | ICD-10-CM | POA: Diagnosis not present

## 2020-12-14 DIAGNOSIS — K7689 Other specified diseases of liver: Secondary | ICD-10-CM | POA: Diagnosis not present

## 2020-12-14 DIAGNOSIS — E8770 Fluid overload, unspecified: Secondary | ICD-10-CM | POA: Diagnosis not present

## 2020-12-14 DIAGNOSIS — Z79899 Other long term (current) drug therapy: Secondary | ICD-10-CM | POA: Diagnosis not present

## 2020-12-14 DIAGNOSIS — D631 Anemia in chronic kidney disease: Secondary | ICD-10-CM | POA: Diagnosis not present

## 2020-12-16 ENCOUNTER — Other Ambulatory Visit: Payer: Self-pay | Admitting: Allergy & Immunology

## 2020-12-17 DIAGNOSIS — E8770 Fluid overload, unspecified: Secondary | ICD-10-CM | POA: Diagnosis not present

## 2020-12-17 DIAGNOSIS — E876 Hypokalemia: Secondary | ICD-10-CM | POA: Diagnosis not present

## 2020-12-17 DIAGNOSIS — N2581 Secondary hyperparathyroidism of renal origin: Secondary | ICD-10-CM | POA: Diagnosis not present

## 2020-12-17 DIAGNOSIS — D631 Anemia in chronic kidney disease: Secondary | ICD-10-CM | POA: Diagnosis not present

## 2020-12-17 DIAGNOSIS — Z4931 Encounter for adequacy testing for hemodialysis: Secondary | ICD-10-CM | POA: Diagnosis not present

## 2020-12-17 DIAGNOSIS — K7689 Other specified diseases of liver: Secondary | ICD-10-CM | POA: Diagnosis not present

## 2020-12-17 DIAGNOSIS — R82998 Other abnormal findings in urine: Secondary | ICD-10-CM | POA: Diagnosis not present

## 2020-12-17 DIAGNOSIS — D509 Iron deficiency anemia, unspecified: Secondary | ICD-10-CM | POA: Diagnosis not present

## 2020-12-17 DIAGNOSIS — N186 End stage renal disease: Secondary | ICD-10-CM | POA: Diagnosis not present

## 2020-12-17 DIAGNOSIS — Z79899 Other long term (current) drug therapy: Secondary | ICD-10-CM | POA: Diagnosis not present

## 2020-12-17 DIAGNOSIS — Z992 Dependence on renal dialysis: Secondary | ICD-10-CM | POA: Diagnosis not present

## 2020-12-18 DIAGNOSIS — N186 End stage renal disease: Secondary | ICD-10-CM | POA: Diagnosis not present

## 2020-12-18 DIAGNOSIS — E8770 Fluid overload, unspecified: Secondary | ICD-10-CM | POA: Diagnosis not present

## 2020-12-18 DIAGNOSIS — Z992 Dependence on renal dialysis: Secondary | ICD-10-CM | POA: Diagnosis not present

## 2020-12-18 DIAGNOSIS — D509 Iron deficiency anemia, unspecified: Secondary | ICD-10-CM | POA: Diagnosis not present

## 2020-12-18 DIAGNOSIS — Z4931 Encounter for adequacy testing for hemodialysis: Secondary | ICD-10-CM | POA: Diagnosis not present

## 2020-12-18 DIAGNOSIS — R82998 Other abnormal findings in urine: Secondary | ICD-10-CM | POA: Diagnosis not present

## 2020-12-18 DIAGNOSIS — Z79899 Other long term (current) drug therapy: Secondary | ICD-10-CM | POA: Diagnosis not present

## 2020-12-18 DIAGNOSIS — N2581 Secondary hyperparathyroidism of renal origin: Secondary | ICD-10-CM | POA: Diagnosis not present

## 2020-12-18 DIAGNOSIS — E876 Hypokalemia: Secondary | ICD-10-CM | POA: Diagnosis not present

## 2020-12-18 DIAGNOSIS — K7689 Other specified diseases of liver: Secondary | ICD-10-CM | POA: Diagnosis not present

## 2020-12-18 DIAGNOSIS — D631 Anemia in chronic kidney disease: Secondary | ICD-10-CM | POA: Diagnosis not present

## 2020-12-20 DIAGNOSIS — E876 Hypokalemia: Secondary | ICD-10-CM | POA: Diagnosis not present

## 2020-12-20 DIAGNOSIS — D509 Iron deficiency anemia, unspecified: Secondary | ICD-10-CM | POA: Diagnosis not present

## 2020-12-20 DIAGNOSIS — E8770 Fluid overload, unspecified: Secondary | ICD-10-CM | POA: Diagnosis not present

## 2020-12-20 DIAGNOSIS — Z79899 Other long term (current) drug therapy: Secondary | ICD-10-CM | POA: Diagnosis not present

## 2020-12-20 DIAGNOSIS — Z4931 Encounter for adequacy testing for hemodialysis: Secondary | ICD-10-CM | POA: Diagnosis not present

## 2020-12-20 DIAGNOSIS — N2581 Secondary hyperparathyroidism of renal origin: Secondary | ICD-10-CM | POA: Diagnosis not present

## 2020-12-20 DIAGNOSIS — D631 Anemia in chronic kidney disease: Secondary | ICD-10-CM | POA: Diagnosis not present

## 2020-12-20 DIAGNOSIS — N186 End stage renal disease: Secondary | ICD-10-CM | POA: Diagnosis not present

## 2020-12-20 DIAGNOSIS — Z992 Dependence on renal dialysis: Secondary | ICD-10-CM | POA: Diagnosis not present

## 2020-12-20 DIAGNOSIS — K7689 Other specified diseases of liver: Secondary | ICD-10-CM | POA: Diagnosis not present

## 2020-12-20 DIAGNOSIS — R82998 Other abnormal findings in urine: Secondary | ICD-10-CM | POA: Diagnosis not present

## 2020-12-21 DIAGNOSIS — Z4931 Encounter for adequacy testing for hemodialysis: Secondary | ICD-10-CM | POA: Diagnosis not present

## 2020-12-21 DIAGNOSIS — D509 Iron deficiency anemia, unspecified: Secondary | ICD-10-CM | POA: Diagnosis not present

## 2020-12-21 DIAGNOSIS — D631 Anemia in chronic kidney disease: Secondary | ICD-10-CM | POA: Diagnosis not present

## 2020-12-21 DIAGNOSIS — E8770 Fluid overload, unspecified: Secondary | ICD-10-CM | POA: Diagnosis not present

## 2020-12-21 DIAGNOSIS — Z79899 Other long term (current) drug therapy: Secondary | ICD-10-CM | POA: Diagnosis not present

## 2020-12-21 DIAGNOSIS — N2581 Secondary hyperparathyroidism of renal origin: Secondary | ICD-10-CM | POA: Diagnosis not present

## 2020-12-21 DIAGNOSIS — E876 Hypokalemia: Secondary | ICD-10-CM | POA: Diagnosis not present

## 2020-12-21 DIAGNOSIS — N186 End stage renal disease: Secondary | ICD-10-CM | POA: Diagnosis not present

## 2020-12-21 DIAGNOSIS — R82998 Other abnormal findings in urine: Secondary | ICD-10-CM | POA: Diagnosis not present

## 2020-12-21 DIAGNOSIS — K7689 Other specified diseases of liver: Secondary | ICD-10-CM | POA: Diagnosis not present

## 2020-12-21 DIAGNOSIS — Z992 Dependence on renal dialysis: Secondary | ICD-10-CM | POA: Diagnosis not present

## 2020-12-24 DIAGNOSIS — I129 Hypertensive chronic kidney disease with stage 1 through stage 4 chronic kidney disease, or unspecified chronic kidney disease: Secondary | ICD-10-CM | POA: Diagnosis not present

## 2020-12-24 DIAGNOSIS — N2581 Secondary hyperparathyroidism of renal origin: Secondary | ICD-10-CM | POA: Diagnosis not present

## 2020-12-24 DIAGNOSIS — D509 Iron deficiency anemia, unspecified: Secondary | ICD-10-CM | POA: Diagnosis not present

## 2020-12-24 DIAGNOSIS — Z79899 Other long term (current) drug therapy: Secondary | ICD-10-CM | POA: Diagnosis not present

## 2020-12-24 DIAGNOSIS — N186 End stage renal disease: Secondary | ICD-10-CM | POA: Diagnosis not present

## 2020-12-24 DIAGNOSIS — E876 Hypokalemia: Secondary | ICD-10-CM | POA: Diagnosis not present

## 2020-12-24 DIAGNOSIS — Z4931 Encounter for adequacy testing for hemodialysis: Secondary | ICD-10-CM | POA: Diagnosis not present

## 2020-12-24 DIAGNOSIS — Z992 Dependence on renal dialysis: Secondary | ICD-10-CM | POA: Diagnosis not present

## 2020-12-24 DIAGNOSIS — K7689 Other specified diseases of liver: Secondary | ICD-10-CM | POA: Diagnosis not present

## 2020-12-24 DIAGNOSIS — E8770 Fluid overload, unspecified: Secondary | ICD-10-CM | POA: Diagnosis not present

## 2020-12-24 DIAGNOSIS — D631 Anemia in chronic kidney disease: Secondary | ICD-10-CM | POA: Diagnosis not present

## 2020-12-24 DIAGNOSIS — R82998 Other abnormal findings in urine: Secondary | ICD-10-CM | POA: Diagnosis not present

## 2020-12-25 DIAGNOSIS — D509 Iron deficiency anemia, unspecified: Secondary | ICD-10-CM | POA: Diagnosis not present

## 2020-12-25 DIAGNOSIS — R82998 Other abnormal findings in urine: Secondary | ICD-10-CM | POA: Diagnosis not present

## 2020-12-25 DIAGNOSIS — N186 End stage renal disease: Secondary | ICD-10-CM | POA: Diagnosis not present

## 2020-12-25 DIAGNOSIS — Z992 Dependence on renal dialysis: Secondary | ICD-10-CM | POA: Diagnosis not present

## 2020-12-25 DIAGNOSIS — K7689 Other specified diseases of liver: Secondary | ICD-10-CM | POA: Diagnosis not present

## 2020-12-25 DIAGNOSIS — D631 Anemia in chronic kidney disease: Secondary | ICD-10-CM | POA: Diagnosis not present

## 2020-12-25 DIAGNOSIS — E8770 Fluid overload, unspecified: Secondary | ICD-10-CM | POA: Diagnosis not present

## 2020-12-25 DIAGNOSIS — Z79899 Other long term (current) drug therapy: Secondary | ICD-10-CM | POA: Diagnosis not present

## 2020-12-25 DIAGNOSIS — E876 Hypokalemia: Secondary | ICD-10-CM | POA: Diagnosis not present

## 2020-12-25 DIAGNOSIS — N2581 Secondary hyperparathyroidism of renal origin: Secondary | ICD-10-CM | POA: Diagnosis not present

## 2020-12-25 DIAGNOSIS — Z4931 Encounter for adequacy testing for hemodialysis: Secondary | ICD-10-CM | POA: Diagnosis not present

## 2020-12-26 DIAGNOSIS — Z992 Dependence on renal dialysis: Secondary | ICD-10-CM | POA: Diagnosis not present

## 2020-12-26 DIAGNOSIS — K7689 Other specified diseases of liver: Secondary | ICD-10-CM | POA: Diagnosis not present

## 2020-12-26 DIAGNOSIS — Z79899 Other long term (current) drug therapy: Secondary | ICD-10-CM | POA: Diagnosis not present

## 2020-12-26 DIAGNOSIS — D631 Anemia in chronic kidney disease: Secondary | ICD-10-CM | POA: Diagnosis not present

## 2020-12-26 DIAGNOSIS — D509 Iron deficiency anemia, unspecified: Secondary | ICD-10-CM | POA: Diagnosis not present

## 2020-12-26 DIAGNOSIS — E8770 Fluid overload, unspecified: Secondary | ICD-10-CM | POA: Diagnosis not present

## 2020-12-26 DIAGNOSIS — E876 Hypokalemia: Secondary | ICD-10-CM | POA: Diagnosis not present

## 2020-12-26 DIAGNOSIS — R82998 Other abnormal findings in urine: Secondary | ICD-10-CM | POA: Diagnosis not present

## 2020-12-26 DIAGNOSIS — N2581 Secondary hyperparathyroidism of renal origin: Secondary | ICD-10-CM | POA: Diagnosis not present

## 2020-12-26 DIAGNOSIS — Z4931 Encounter for adequacy testing for hemodialysis: Secondary | ICD-10-CM | POA: Diagnosis not present

## 2020-12-26 DIAGNOSIS — N186 End stage renal disease: Secondary | ICD-10-CM | POA: Diagnosis not present

## 2020-12-27 DIAGNOSIS — Z4931 Encounter for adequacy testing for hemodialysis: Secondary | ICD-10-CM | POA: Diagnosis not present

## 2020-12-27 DIAGNOSIS — Z79899 Other long term (current) drug therapy: Secondary | ICD-10-CM | POA: Diagnosis not present

## 2020-12-27 DIAGNOSIS — Z992 Dependence on renal dialysis: Secondary | ICD-10-CM | POA: Diagnosis not present

## 2020-12-27 DIAGNOSIS — D631 Anemia in chronic kidney disease: Secondary | ICD-10-CM | POA: Diagnosis not present

## 2020-12-27 DIAGNOSIS — N2581 Secondary hyperparathyroidism of renal origin: Secondary | ICD-10-CM | POA: Diagnosis not present

## 2020-12-27 DIAGNOSIS — E8770 Fluid overload, unspecified: Secondary | ICD-10-CM | POA: Diagnosis not present

## 2020-12-27 DIAGNOSIS — E876 Hypokalemia: Secondary | ICD-10-CM | POA: Diagnosis not present

## 2020-12-27 DIAGNOSIS — D509 Iron deficiency anemia, unspecified: Secondary | ICD-10-CM | POA: Diagnosis not present

## 2020-12-27 DIAGNOSIS — R82998 Other abnormal findings in urine: Secondary | ICD-10-CM | POA: Diagnosis not present

## 2020-12-27 DIAGNOSIS — K7689 Other specified diseases of liver: Secondary | ICD-10-CM | POA: Diagnosis not present

## 2020-12-27 DIAGNOSIS — N186 End stage renal disease: Secondary | ICD-10-CM | POA: Diagnosis not present

## 2020-12-28 ENCOUNTER — Telehealth: Payer: Self-pay | Admitting: Cardiovascular Disease

## 2020-12-28 DIAGNOSIS — I493 Ventricular premature depolarization: Secondary | ICD-10-CM | POA: Diagnosis not present

## 2020-12-28 DIAGNOSIS — I491 Atrial premature depolarization: Secondary | ICD-10-CM | POA: Diagnosis not present

## 2020-12-28 DIAGNOSIS — N186 End stage renal disease: Secondary | ICD-10-CM | POA: Diagnosis not present

## 2020-12-28 DIAGNOSIS — R82998 Other abnormal findings in urine: Secondary | ICD-10-CM | POA: Diagnosis not present

## 2020-12-28 DIAGNOSIS — K7689 Other specified diseases of liver: Secondary | ICD-10-CM | POA: Diagnosis not present

## 2020-12-28 DIAGNOSIS — Z79899 Other long term (current) drug therapy: Secondary | ICD-10-CM | POA: Diagnosis not present

## 2020-12-28 DIAGNOSIS — E8770 Fluid overload, unspecified: Secondary | ICD-10-CM | POA: Diagnosis not present

## 2020-12-28 DIAGNOSIS — N2581 Secondary hyperparathyroidism of renal origin: Secondary | ICD-10-CM | POA: Diagnosis not present

## 2020-12-28 DIAGNOSIS — E876 Hypokalemia: Secondary | ICD-10-CM | POA: Diagnosis not present

## 2020-12-28 DIAGNOSIS — I340 Nonrheumatic mitral (valve) insufficiency: Secondary | ICD-10-CM | POA: Diagnosis not present

## 2020-12-28 DIAGNOSIS — R9431 Abnormal electrocardiogram [ECG] [EKG]: Secondary | ICD-10-CM | POA: Diagnosis not present

## 2020-12-28 DIAGNOSIS — I471 Supraventricular tachycardia: Secondary | ICD-10-CM

## 2020-12-28 DIAGNOSIS — D509 Iron deficiency anemia, unspecified: Secondary | ICD-10-CM | POA: Diagnosis not present

## 2020-12-28 DIAGNOSIS — Z4931 Encounter for adequacy testing for hemodialysis: Secondary | ICD-10-CM | POA: Diagnosis not present

## 2020-12-28 DIAGNOSIS — Z992 Dependence on renal dialysis: Secondary | ICD-10-CM | POA: Diagnosis not present

## 2020-12-28 DIAGNOSIS — D631 Anemia in chronic kidney disease: Secondary | ICD-10-CM | POA: Diagnosis not present

## 2020-12-28 NOTE — Telephone Encounter (Signed)
Spoke with Nicholette from [employer]. She reports the patient had an episode of SVT at 184 bpm lasting >60 seconds.  He had over 200 episodes in 14 days of wear.  Per Dr. [name], spoke with the patient and placed EP referral.  The patient was grateful for call and agrees with plan.

## 2020-12-28 NOTE — Telephone Encounter (Signed)
Nicholette from [facility] calling with abnormal zio patch results.

## 2020-12-31 ENCOUNTER — Other Ambulatory Visit: Payer: Self-pay | Admitting: Cardiovascular Disease

## 2020-12-31 DIAGNOSIS — R82998 Other abnormal findings in urine: Secondary | ICD-10-CM | POA: Diagnosis not present

## 2020-12-31 DIAGNOSIS — K7689 Other specified diseases of liver: Secondary | ICD-10-CM | POA: Diagnosis not present

## 2020-12-31 DIAGNOSIS — D631 Anemia in chronic kidney disease: Secondary | ICD-10-CM | POA: Diagnosis not present

## 2020-12-31 DIAGNOSIS — E876 Hypokalemia: Secondary | ICD-10-CM | POA: Diagnosis not present

## 2020-12-31 DIAGNOSIS — N186 End stage renal disease: Secondary | ICD-10-CM | POA: Diagnosis not present

## 2020-12-31 DIAGNOSIS — D509 Iron deficiency anemia, unspecified: Secondary | ICD-10-CM | POA: Diagnosis not present

## 2020-12-31 DIAGNOSIS — N2581 Secondary hyperparathyroidism of renal origin: Secondary | ICD-10-CM | POA: Diagnosis not present

## 2020-12-31 DIAGNOSIS — Z992 Dependence on renal dialysis: Secondary | ICD-10-CM | POA: Diagnosis not present

## 2020-12-31 DIAGNOSIS — Z79899 Other long term (current) drug therapy: Secondary | ICD-10-CM | POA: Diagnosis not present

## 2020-12-31 DIAGNOSIS — E8770 Fluid overload, unspecified: Secondary | ICD-10-CM | POA: Diagnosis not present

## 2020-12-31 DIAGNOSIS — I5032 Chronic diastolic (congestive) heart failure: Secondary | ICD-10-CM

## 2020-12-31 DIAGNOSIS — Z4931 Encounter for adequacy testing for hemodialysis: Secondary | ICD-10-CM | POA: Diagnosis not present

## 2021-01-01 DIAGNOSIS — E876 Hypokalemia: Secondary | ICD-10-CM | POA: Diagnosis not present

## 2021-01-01 DIAGNOSIS — D509 Iron deficiency anemia, unspecified: Secondary | ICD-10-CM | POA: Diagnosis not present

## 2021-01-01 DIAGNOSIS — Z4931 Encounter for adequacy testing for hemodialysis: Secondary | ICD-10-CM | POA: Diagnosis not present

## 2021-01-01 DIAGNOSIS — K7689 Other specified diseases of liver: Secondary | ICD-10-CM | POA: Diagnosis not present

## 2021-01-01 DIAGNOSIS — E8770 Fluid overload, unspecified: Secondary | ICD-10-CM | POA: Diagnosis not present

## 2021-01-01 DIAGNOSIS — N2581 Secondary hyperparathyroidism of renal origin: Secondary | ICD-10-CM | POA: Diagnosis not present

## 2021-01-01 DIAGNOSIS — N186 End stage renal disease: Secondary | ICD-10-CM | POA: Diagnosis not present

## 2021-01-01 DIAGNOSIS — Z79899 Other long term (current) drug therapy: Secondary | ICD-10-CM | POA: Diagnosis not present

## 2021-01-01 DIAGNOSIS — Z992 Dependence on renal dialysis: Secondary | ICD-10-CM | POA: Diagnosis not present

## 2021-01-01 DIAGNOSIS — R82998 Other abnormal findings in urine: Secondary | ICD-10-CM | POA: Diagnosis not present

## 2021-01-01 DIAGNOSIS — D631 Anemia in chronic kidney disease: Secondary | ICD-10-CM | POA: Diagnosis not present

## 2021-01-02 ENCOUNTER — Encounter: Payer: Self-pay | Admitting: Pulmonary Disease

## 2021-01-02 ENCOUNTER — Other Ambulatory Visit: Payer: Self-pay

## 2021-01-02 ENCOUNTER — Ambulatory Visit (INDEPENDENT_AMBULATORY_CARE_PROVIDER_SITE_OTHER): Payer: Medicare Other | Admitting: Pulmonary Disease

## 2021-01-02 VITALS — BP 148/88 | HR 94 | Temp 97.3°F | Ht 70.0 in | Wt 207.0 lb

## 2021-01-02 DIAGNOSIS — G4733 Obstructive sleep apnea (adult) (pediatric): Secondary | ICD-10-CM | POA: Diagnosis not present

## 2021-01-02 MED ORDER — CLONAZEPAM 0.5 MG PO TABS: 0.5000 mg | ORAL_TABLET | Freq: Every day | ORAL | 2 refills | Status: DC

## 2021-01-02 NOTE — Progress Notes (Signed)
Subjective:    Patient ID: Albert Eaton, male    DOB: [birth year], 55 y.o.   MRN: [id number]  Patient with a history of mild obstructive sleep apnea has been on CPAP  Continues to use CPAP on a regular basis States that he is having some issues with the mask pressures  He does have issues with insomnia both sleep onset and sleep maintenance insomnia -Was reluctant in the past trying any new medications as he feels he is on a lot of medications already  He had recently discussed restless legs with nephrologist  He continues to feel that the pressure may be a little bit too much but I did review the download from the machine with him-no significant leaks, mean pressure of 7, 95 percentile pressure of 9.4   He occasionally feels the pressure may be a little bit too much Usually tries to go to bed by about 10 PM, on dialysis days goes to bed between 6 and 7 PM Sometimes takes him up to 2 to 3 hours to fall asleep Wakes up frequently in the middle of the night Final awakening time about 9 AM  Multiple comorbidities History of heart disease, end-stage renal disease  He does have dryness of his mouth during the night, occasional headaches in the morning, memory has been fine  His weight is down about 20 pounds recently  He does get short of breath with activity  He does have sleep onset and sleep maintenance insomnia, but-feels is on a lot of medications right now and does not want to add any other things to it  Past Medical History:  Diagnosis Date  . Anemia   . Aortic atherosclerosis ([place])   . Asthma   . Chronic diastolic CHF (congestive heart failure) ([place]) 01/06/2017   Echo 7/16 [facility] in [place], [name]) Mild AI, mild LAE, mild concentric LVH, EF 55, normal wall motion, mild to moderate MR, mild PI, RVSP 55 // Echo 10/09/16 ([facility]):  Moderate LVH, grade 2 diastolic dysfunction, mild MR, moderate LAE   . CKD (chronic kidney disease)    per pt, waiting on a kidney  transplant in near future.  . Esophagitis   . Gout   . Hepatitis    Hep B  . History of nuclear stress test    a. Nuc study 7/16: no scar or ischemia, EF 42 // b. Nuc study 12/17: EF 48, ?small apical ischemia, Low Risk  . HIV (human immunodeficiency virus infection) ([employer])   . HLD (hyperlipidemia)   . Hypertension   . Hypertensive heart disease with CHF (congestive heart failure) ([place]) 10/09/2016  . Mitral regurgitation   . OSA (obstructive sleep apnea)   . Post-operative nausea and vomiting    1 time as a child  . Sleep apnea    uses c-pap  . Wears glasses   . Wears partial dentures    Family History  Problem Relation Age of Onset  . Heart failure Mother   . Heart attack Maternal Grandmother 53  . Hypertension Sister   . Multiple sclerosis Sister   . Hypertension Brother   . Hypertension Sister   . Scoliosis Other   . Allergic rhinitis Neg Hx   . Angioedema Neg Hx   . Asthma Neg Hx   . Eczema Neg Hx   . Immunodeficiency Neg Hx   . Urticaria Neg Hx    Social History   Socioeconomic History  . Marital status: Single    Spouse name:  Not on file  . Number of children: Not on file  . Years of education: Not on file  . Highest education level: Not on file  Occupational History  . Not on file  Tobacco Use  . Smoking status: Former Smoker    Types: Cigarettes    Quit date: 2000    Years since quitting: 22.2  . Smokeless tobacco: Never Used  Vaping Use  . Vaping Use: Never used  Substance and Sexual Activity  . Alcohol use: Yes    Comment: once a month   . Drug use: No  . Sexual activity: Not Currently    Partners: Male    Birth control/protection: Condom    Comment: declined  condoms 07/11/20  Other Topics Concern  . Not on file  Social History Narrative  . Not on file   Social Determinants of Health   Financial Resource Strain: Not on file  Food Insecurity: No Food Insecurity  . Worried About Charity fundraiser in the Last Year: Never true  . Ran Out  of Food in the Last Year: Never true  Transportation Needs: No Transportation Needs  . Lack of Transportation (Medical): No  . Lack of Transportation (Non-Medical): No  Physical Activity: Not on file  Stress: Not on file  Social Connections: Not on file  Intimate Partner Violence: Not on file       Review of Systems  Constitutional: Negative.   HENT: Positive for congestion and sneezing.   Eyes: Negative.   Respiratory: Positive for apnea.   Cardiovascular: Negative.   Gastrointestinal: Negative.   Genitourinary: Negative.   Psychiatric/Behavioral: Positive for sleep disturbance.  All other systems reviewed and are negative.      Objective:   Physical Exam Constitutional:      Appearance: Normal appearance.  HENT:     Nose: No congestion.  Eyes:     General:        Right eye: No discharge.        Left eye: No discharge.     Pupils: Pupils are equal, round, and reactive to light.  Cardiovascular:     Pulses: Normal pulses.     Heart sounds: Normal heart sounds. No murmur heard. No friction rub.  Pulmonary:     Effort: Pulmonary effort is normal. No respiratory distress.     Breath sounds: Normal breath sounds. No stridor. No wheezing or rhonchi.  Musculoskeletal:     Cervical back: No rigidity.  Neurological:     Mental Status: He is alert.  Psychiatric:        Mood and Affect: Mood normal.   Polysomnogram from 12/24/2017 reviewed revealing mild obstructive sleep apnea  Compliance data reviewed showing 83% compliance Machine set at 5-20 Median pressure of 7, 95 percentile pressure of 9.4 No significant air leaks    Assessment & Plan:  .  Mild obstructive sleep apnea -Encouraged to continue using CPAP on a regular basis  Sleep onset and sleep maintenance insomnia -Trial with Klonopin which should help his insomnia and also help his restless legs   Plan: Continue CPAP  Send prescription for CPAP supplies  Continue current CPAP pressures  Follow-up  in 3 months to assess compliance and med compliance  Encouraged to call with any significant concerns

## 2021-01-02 NOTE — Patient Instructions (Addendum)
Restless legs Obstructive sleep apnea  We will try Klonopin 0.5 mg nightly -This should help your inability to fall asleep and stay asleep -will also help the restless legs  CPAP supplies will be sent into the DME company  Call with significant concerns  If you are bothered by the effect of the Klonopin, you may try to use half the dose-making you groggy or tired in the morning, the dose can also be increased if the benefit is not optimal  I will see you back in 3 months

## 2021-01-03 DIAGNOSIS — K7689 Other specified diseases of liver: Secondary | ICD-10-CM | POA: Diagnosis not present

## 2021-01-03 DIAGNOSIS — R82998 Other abnormal findings in urine: Secondary | ICD-10-CM | POA: Diagnosis not present

## 2021-01-03 DIAGNOSIS — E8770 Fluid overload, unspecified: Secondary | ICD-10-CM | POA: Diagnosis not present

## 2021-01-03 DIAGNOSIS — N186 End stage renal disease: Secondary | ICD-10-CM | POA: Diagnosis not present

## 2021-01-03 DIAGNOSIS — Z79899 Other long term (current) drug therapy: Secondary | ICD-10-CM | POA: Diagnosis not present

## 2021-01-03 DIAGNOSIS — E876 Hypokalemia: Secondary | ICD-10-CM | POA: Diagnosis not present

## 2021-01-03 DIAGNOSIS — N2581 Secondary hyperparathyroidism of renal origin: Secondary | ICD-10-CM | POA: Diagnosis not present

## 2021-01-03 DIAGNOSIS — D509 Iron deficiency anemia, unspecified: Secondary | ICD-10-CM | POA: Diagnosis not present

## 2021-01-03 DIAGNOSIS — D631 Anemia in chronic kidney disease: Secondary | ICD-10-CM | POA: Diagnosis not present

## 2021-01-03 DIAGNOSIS — Z992 Dependence on renal dialysis: Secondary | ICD-10-CM | POA: Diagnosis not present

## 2021-01-03 DIAGNOSIS — Z4931 Encounter for adequacy testing for hemodialysis: Secondary | ICD-10-CM | POA: Diagnosis not present

## 2021-01-04 DIAGNOSIS — N186 End stage renal disease: Secondary | ICD-10-CM | POA: Diagnosis not present

## 2021-01-04 DIAGNOSIS — Z992 Dependence on renal dialysis: Secondary | ICD-10-CM | POA: Diagnosis not present

## 2021-01-04 DIAGNOSIS — E8770 Fluid overload, unspecified: Secondary | ICD-10-CM | POA: Diagnosis not present

## 2021-01-04 DIAGNOSIS — Z79899 Other long term (current) drug therapy: Secondary | ICD-10-CM | POA: Diagnosis not present

## 2021-01-04 DIAGNOSIS — K7689 Other specified diseases of liver: Secondary | ICD-10-CM | POA: Diagnosis not present

## 2021-01-04 DIAGNOSIS — D631 Anemia in chronic kidney disease: Secondary | ICD-10-CM | POA: Diagnosis not present

## 2021-01-04 DIAGNOSIS — N2581 Secondary hyperparathyroidism of renal origin: Secondary | ICD-10-CM | POA: Diagnosis not present

## 2021-01-04 DIAGNOSIS — Z4931 Encounter for adequacy testing for hemodialysis: Secondary | ICD-10-CM | POA: Diagnosis not present

## 2021-01-04 DIAGNOSIS — R82998 Other abnormal findings in urine: Secondary | ICD-10-CM | POA: Diagnosis not present

## 2021-01-04 DIAGNOSIS — D509 Iron deficiency anemia, unspecified: Secondary | ICD-10-CM | POA: Diagnosis not present

## 2021-01-04 DIAGNOSIS — E876 Hypokalemia: Secondary | ICD-10-CM | POA: Diagnosis not present

## 2021-01-07 ENCOUNTER — Other Ambulatory Visit: Payer: Self-pay | Admitting: Family Medicine

## 2021-01-07 DIAGNOSIS — Z992 Dependence on renal dialysis: Secondary | ICD-10-CM | POA: Diagnosis not present

## 2021-01-07 DIAGNOSIS — Z4931 Encounter for adequacy testing for hemodialysis: Secondary | ICD-10-CM | POA: Diagnosis not present

## 2021-01-07 DIAGNOSIS — Z79899 Other long term (current) drug therapy: Secondary | ICD-10-CM | POA: Diagnosis not present

## 2021-01-07 DIAGNOSIS — D509 Iron deficiency anemia, unspecified: Secondary | ICD-10-CM | POA: Diagnosis not present

## 2021-01-07 DIAGNOSIS — D631 Anemia in chronic kidney disease: Secondary | ICD-10-CM | POA: Diagnosis not present

## 2021-01-07 DIAGNOSIS — R82998 Other abnormal findings in urine: Secondary | ICD-10-CM | POA: Diagnosis not present

## 2021-01-07 DIAGNOSIS — K7689 Other specified diseases of liver: Secondary | ICD-10-CM | POA: Diagnosis not present

## 2021-01-07 DIAGNOSIS — N186 End stage renal disease: Secondary | ICD-10-CM | POA: Diagnosis not present

## 2021-01-07 DIAGNOSIS — E876 Hypokalemia: Secondary | ICD-10-CM | POA: Diagnosis not present

## 2021-01-07 DIAGNOSIS — E8770 Fluid overload, unspecified: Secondary | ICD-10-CM | POA: Diagnosis not present

## 2021-01-07 DIAGNOSIS — N2581 Secondary hyperparathyroidism of renal origin: Secondary | ICD-10-CM | POA: Diagnosis not present

## 2021-01-07 DIAGNOSIS — M109 Gout, unspecified: Secondary | ICD-10-CM

## 2021-01-08 DIAGNOSIS — N2581 Secondary hyperparathyroidism of renal origin: Secondary | ICD-10-CM | POA: Diagnosis not present

## 2021-01-08 DIAGNOSIS — N186 End stage renal disease: Secondary | ICD-10-CM | POA: Diagnosis not present

## 2021-01-08 DIAGNOSIS — E8770 Fluid overload, unspecified: Secondary | ICD-10-CM | POA: Diagnosis not present

## 2021-01-08 DIAGNOSIS — Z4931 Encounter for adequacy testing for hemodialysis: Secondary | ICD-10-CM | POA: Diagnosis not present

## 2021-01-08 DIAGNOSIS — Z79899 Other long term (current) drug therapy: Secondary | ICD-10-CM | POA: Diagnosis not present

## 2021-01-08 DIAGNOSIS — D509 Iron deficiency anemia, unspecified: Secondary | ICD-10-CM | POA: Diagnosis not present

## 2021-01-08 DIAGNOSIS — D631 Anemia in chronic kidney disease: Secondary | ICD-10-CM | POA: Diagnosis not present

## 2021-01-08 DIAGNOSIS — K7689 Other specified diseases of liver: Secondary | ICD-10-CM | POA: Diagnosis not present

## 2021-01-08 DIAGNOSIS — E876 Hypokalemia: Secondary | ICD-10-CM | POA: Diagnosis not present

## 2021-01-08 DIAGNOSIS — R82998 Other abnormal findings in urine: Secondary | ICD-10-CM | POA: Diagnosis not present

## 2021-01-08 DIAGNOSIS — Z992 Dependence on renal dialysis: Secondary | ICD-10-CM | POA: Diagnosis not present

## 2021-01-09 DIAGNOSIS — E876 Hypokalemia: Secondary | ICD-10-CM | POA: Diagnosis not present

## 2021-01-09 DIAGNOSIS — D631 Anemia in chronic kidney disease: Secondary | ICD-10-CM | POA: Diagnosis not present

## 2021-01-09 DIAGNOSIS — N2581 Secondary hyperparathyroidism of renal origin: Secondary | ICD-10-CM | POA: Diagnosis not present

## 2021-01-09 DIAGNOSIS — Z79899 Other long term (current) drug therapy: Secondary | ICD-10-CM | POA: Diagnosis not present

## 2021-01-09 DIAGNOSIS — Z4931 Encounter for adequacy testing for hemodialysis: Secondary | ICD-10-CM | POA: Diagnosis not present

## 2021-01-09 DIAGNOSIS — Z992 Dependence on renal dialysis: Secondary | ICD-10-CM | POA: Diagnosis not present

## 2021-01-09 DIAGNOSIS — N186 End stage renal disease: Secondary | ICD-10-CM | POA: Diagnosis not present

## 2021-01-09 DIAGNOSIS — E8770 Fluid overload, unspecified: Secondary | ICD-10-CM | POA: Diagnosis not present

## 2021-01-09 DIAGNOSIS — R82998 Other abnormal findings in urine: Secondary | ICD-10-CM | POA: Diagnosis not present

## 2021-01-09 DIAGNOSIS — K7689 Other specified diseases of liver: Secondary | ICD-10-CM | POA: Diagnosis not present

## 2021-01-09 DIAGNOSIS — D509 Iron deficiency anemia, unspecified: Secondary | ICD-10-CM | POA: Diagnosis not present

## 2021-01-10 DIAGNOSIS — E876 Hypokalemia: Secondary | ICD-10-CM | POA: Diagnosis not present

## 2021-01-10 DIAGNOSIS — R82998 Other abnormal findings in urine: Secondary | ICD-10-CM | POA: Diagnosis not present

## 2021-01-10 DIAGNOSIS — Z79899 Other long term (current) drug therapy: Secondary | ICD-10-CM | POA: Diagnosis not present

## 2021-01-10 DIAGNOSIS — N186 End stage renal disease: Secondary | ICD-10-CM | POA: Diagnosis not present

## 2021-01-10 DIAGNOSIS — D631 Anemia in chronic kidney disease: Secondary | ICD-10-CM | POA: Diagnosis not present

## 2021-01-10 DIAGNOSIS — Z4931 Encounter for adequacy testing for hemodialysis: Secondary | ICD-10-CM | POA: Diagnosis not present

## 2021-01-10 DIAGNOSIS — D509 Iron deficiency anemia, unspecified: Secondary | ICD-10-CM | POA: Diagnosis not present

## 2021-01-10 DIAGNOSIS — N2581 Secondary hyperparathyroidism of renal origin: Secondary | ICD-10-CM | POA: Diagnosis not present

## 2021-01-10 DIAGNOSIS — E8770 Fluid overload, unspecified: Secondary | ICD-10-CM | POA: Diagnosis not present

## 2021-01-10 DIAGNOSIS — K7689 Other specified diseases of liver: Secondary | ICD-10-CM | POA: Diagnosis not present

## 2021-01-10 DIAGNOSIS — Z992 Dependence on renal dialysis: Secondary | ICD-10-CM | POA: Diagnosis not present

## 2021-01-11 DIAGNOSIS — N186 End stage renal disease: Secondary | ICD-10-CM | POA: Diagnosis not present

## 2021-01-11 DIAGNOSIS — E876 Hypokalemia: Secondary | ICD-10-CM | POA: Diagnosis not present

## 2021-01-11 DIAGNOSIS — E8770 Fluid overload, unspecified: Secondary | ICD-10-CM | POA: Diagnosis not present

## 2021-01-11 DIAGNOSIS — D509 Iron deficiency anemia, unspecified: Secondary | ICD-10-CM | POA: Diagnosis not present

## 2021-01-11 DIAGNOSIS — Z79899 Other long term (current) drug therapy: Secondary | ICD-10-CM | POA: Diagnosis not present

## 2021-01-11 DIAGNOSIS — N2581 Secondary hyperparathyroidism of renal origin: Secondary | ICD-10-CM | POA: Diagnosis not present

## 2021-01-11 DIAGNOSIS — R82998 Other abnormal findings in urine: Secondary | ICD-10-CM | POA: Diagnosis not present

## 2021-01-11 DIAGNOSIS — Z992 Dependence on renal dialysis: Secondary | ICD-10-CM | POA: Diagnosis not present

## 2021-01-11 DIAGNOSIS — K7689 Other specified diseases of liver: Secondary | ICD-10-CM | POA: Diagnosis not present

## 2021-01-11 DIAGNOSIS — D631 Anemia in chronic kidney disease: Secondary | ICD-10-CM | POA: Diagnosis not present

## 2021-01-11 DIAGNOSIS — Z4931 Encounter for adequacy testing for hemodialysis: Secondary | ICD-10-CM | POA: Diagnosis not present

## 2021-01-14 DIAGNOSIS — N186 End stage renal disease: Secondary | ICD-10-CM | POA: Diagnosis not present

## 2021-01-14 DIAGNOSIS — R82998 Other abnormal findings in urine: Secondary | ICD-10-CM | POA: Diagnosis not present

## 2021-01-14 DIAGNOSIS — D509 Iron deficiency anemia, unspecified: Secondary | ICD-10-CM | POA: Diagnosis not present

## 2021-01-14 DIAGNOSIS — D631 Anemia in chronic kidney disease: Secondary | ICD-10-CM | POA: Diagnosis not present

## 2021-01-14 DIAGNOSIS — Z79899 Other long term (current) drug therapy: Secondary | ICD-10-CM | POA: Diagnosis not present

## 2021-01-14 DIAGNOSIS — N2581 Secondary hyperparathyroidism of renal origin: Secondary | ICD-10-CM | POA: Diagnosis not present

## 2021-01-14 DIAGNOSIS — E876 Hypokalemia: Secondary | ICD-10-CM | POA: Diagnosis not present

## 2021-01-14 DIAGNOSIS — Z992 Dependence on renal dialysis: Secondary | ICD-10-CM | POA: Diagnosis not present

## 2021-01-14 DIAGNOSIS — K7689 Other specified diseases of liver: Secondary | ICD-10-CM | POA: Diagnosis not present

## 2021-01-14 DIAGNOSIS — E8770 Fluid overload, unspecified: Secondary | ICD-10-CM | POA: Diagnosis not present

## 2021-01-14 DIAGNOSIS — Z4931 Encounter for adequacy testing for hemodialysis: Secondary | ICD-10-CM | POA: Diagnosis not present

## 2021-01-15 DIAGNOSIS — Z4931 Encounter for adequacy testing for hemodialysis: Secondary | ICD-10-CM | POA: Diagnosis not present

## 2021-01-15 DIAGNOSIS — D631 Anemia in chronic kidney disease: Secondary | ICD-10-CM | POA: Diagnosis not present

## 2021-01-15 DIAGNOSIS — N2889 Other specified disorders of kidney and ureter: Secondary | ICD-10-CM | POA: Diagnosis not present

## 2021-01-15 DIAGNOSIS — K7689 Other specified diseases of liver: Secondary | ICD-10-CM | POA: Diagnosis not present

## 2021-01-15 DIAGNOSIS — C439 Malignant melanoma of skin, unspecified: Secondary | ICD-10-CM | POA: Diagnosis not present

## 2021-01-15 DIAGNOSIS — D509 Iron deficiency anemia, unspecified: Secondary | ICD-10-CM | POA: Diagnosis not present

## 2021-01-15 DIAGNOSIS — Z79899 Other long term (current) drug therapy: Secondary | ICD-10-CM | POA: Diagnosis not present

## 2021-01-15 DIAGNOSIS — N186 End stage renal disease: Secondary | ICD-10-CM | POA: Diagnosis not present

## 2021-01-15 DIAGNOSIS — Z992 Dependence on renal dialysis: Secondary | ICD-10-CM | POA: Diagnosis not present

## 2021-01-15 DIAGNOSIS — E876 Hypokalemia: Secondary | ICD-10-CM | POA: Diagnosis not present

## 2021-01-15 DIAGNOSIS — E8770 Fluid overload, unspecified: Secondary | ICD-10-CM | POA: Diagnosis not present

## 2021-01-15 DIAGNOSIS — C642 Malignant neoplasm of left kidney, except renal pelvis: Secondary | ICD-10-CM | POA: Diagnosis not present

## 2021-01-15 DIAGNOSIS — R82998 Other abnormal findings in urine: Secondary | ICD-10-CM | POA: Diagnosis not present

## 2021-01-15 DIAGNOSIS — N2581 Secondary hyperparathyroidism of renal origin: Secondary | ICD-10-CM | POA: Diagnosis not present

## 2021-01-16 DIAGNOSIS — E876 Hypokalemia: Secondary | ICD-10-CM | POA: Diagnosis not present

## 2021-01-16 DIAGNOSIS — N186 End stage renal disease: Secondary | ICD-10-CM | POA: Diagnosis not present

## 2021-01-16 DIAGNOSIS — N2581 Secondary hyperparathyroidism of renal origin: Secondary | ICD-10-CM | POA: Diagnosis not present

## 2021-01-16 DIAGNOSIS — Z992 Dependence on renal dialysis: Secondary | ICD-10-CM | POA: Diagnosis not present

## 2021-01-16 DIAGNOSIS — E8770 Fluid overload, unspecified: Secondary | ICD-10-CM | POA: Diagnosis not present

## 2021-01-17 DIAGNOSIS — Z992 Dependence on renal dialysis: Secondary | ICD-10-CM | POA: Diagnosis not present

## 2021-01-17 DIAGNOSIS — E876 Hypokalemia: Secondary | ICD-10-CM | POA: Diagnosis not present

## 2021-01-17 DIAGNOSIS — D509 Iron deficiency anemia, unspecified: Secondary | ICD-10-CM | POA: Diagnosis not present

## 2021-01-17 DIAGNOSIS — N186 End stage renal disease: Secondary | ICD-10-CM | POA: Diagnosis not present

## 2021-01-17 DIAGNOSIS — K7689 Other specified diseases of liver: Secondary | ICD-10-CM | POA: Diagnosis not present

## 2021-01-17 DIAGNOSIS — R82998 Other abnormal findings in urine: Secondary | ICD-10-CM | POA: Diagnosis not present

## 2021-01-17 DIAGNOSIS — E8770 Fluid overload, unspecified: Secondary | ICD-10-CM | POA: Diagnosis not present

## 2021-01-17 DIAGNOSIS — D631 Anemia in chronic kidney disease: Secondary | ICD-10-CM | POA: Diagnosis not present

## 2021-01-17 DIAGNOSIS — Z4931 Encounter for adequacy testing for hemodialysis: Secondary | ICD-10-CM | POA: Diagnosis not present

## 2021-01-17 DIAGNOSIS — N2581 Secondary hyperparathyroidism of renal origin: Secondary | ICD-10-CM | POA: Diagnosis not present

## 2021-01-17 DIAGNOSIS — Z79899 Other long term (current) drug therapy: Secondary | ICD-10-CM | POA: Diagnosis not present

## 2021-01-18 ENCOUNTER — Other Ambulatory Visit: Payer: Self-pay

## 2021-01-18 ENCOUNTER — Ambulatory Visit (INDEPENDENT_AMBULATORY_CARE_PROVIDER_SITE_OTHER): Payer: Medicare Other | Admitting: Cardiology

## 2021-01-18 ENCOUNTER — Other Ambulatory Visit: Payer: Self-pay | Admitting: Cardiovascular Disease

## 2021-01-18 ENCOUNTER — Encounter: Payer: Self-pay | Admitting: Cardiology

## 2021-01-18 VITALS — BP 142/88 | HR 93 | Ht 70.0 in | Wt 205.0 lb

## 2021-01-18 DIAGNOSIS — N2581 Secondary hyperparathyroidism of renal origin: Secondary | ICD-10-CM | POA: Diagnosis not present

## 2021-01-18 DIAGNOSIS — I491 Atrial premature depolarization: Secondary | ICD-10-CM | POA: Diagnosis not present

## 2021-01-18 DIAGNOSIS — I471 Supraventricular tachycardia: Secondary | ICD-10-CM | POA: Diagnosis not present

## 2021-01-18 DIAGNOSIS — I340 Nonrheumatic mitral (valve) insufficiency: Secondary | ICD-10-CM | POA: Diagnosis not present

## 2021-01-18 DIAGNOSIS — Z79899 Other long term (current) drug therapy: Secondary | ICD-10-CM | POA: Diagnosis not present

## 2021-01-18 DIAGNOSIS — N186 End stage renal disease: Secondary | ICD-10-CM

## 2021-01-18 DIAGNOSIS — Z992 Dependence on renal dialysis: Secondary | ICD-10-CM | POA: Diagnosis not present

## 2021-01-18 DIAGNOSIS — R82998 Other abnormal findings in urine: Secondary | ICD-10-CM | POA: Diagnosis not present

## 2021-01-18 DIAGNOSIS — D509 Iron deficiency anemia, unspecified: Secondary | ICD-10-CM | POA: Diagnosis not present

## 2021-01-18 DIAGNOSIS — D631 Anemia in chronic kidney disease: Secondary | ICD-10-CM | POA: Diagnosis not present

## 2021-01-18 DIAGNOSIS — E8770 Fluid overload, unspecified: Secondary | ICD-10-CM | POA: Diagnosis not present

## 2021-01-18 DIAGNOSIS — K7689 Other specified diseases of liver: Secondary | ICD-10-CM | POA: Diagnosis not present

## 2021-01-18 DIAGNOSIS — Z4931 Encounter for adequacy testing for hemodialysis: Secondary | ICD-10-CM | POA: Diagnosis not present

## 2021-01-18 DIAGNOSIS — E876 Hypokalemia: Secondary | ICD-10-CM | POA: Diagnosis not present

## 2021-01-18 NOTE — Patient Instructions (Signed)
Medication Instructions:  Your physician recommends that you continue on your current medications as directed. Please refer to the Current Medication list given to you today. *If you need a refill on your cardiac medications before your next appointment, please call your pharmacy*  Lab Work: None ordered. If you have labs (blood work) drawn today and your tests are completely normal, you will receive your results only by: Marland Kitchen MyChart Message (if you have MyChart) OR . A paper copy in the mail If you have any lab test that is abnormal or we need to change your treatment, we will call you to review the results.  Testing/Procedures: None ordered.  Follow-Up: At [facility], you and your health needs are our priority.  As part of our continuing mission to provide you with exceptional heart care, we have created designated Provider Care Teams.  These Care Teams include your primary Cardiologist (physician) and Advanced Practice Providers (APPs -  Physician Assistants and Nurse Practitioners) who all work together to provide you with the care you need, when you need it.  Your next appointment:   Your physician wants you to follow-up in: as needed with Dr. Quentin Ore.

## 2021-01-18 NOTE — Progress Notes (Signed)
Electrophysiology Office Note:    Date:  01/18/2021   ID:  Albert Eaton, DOB 11-25-1965, MRN [id number]  PCP:  [name], FNP  [facility] Cardiologist:  [name], MD  [facility] Electrophysiologist:  None   Referring MD: [name], MD   Chief Complaint: SVT  History of Present Illness:    Albert Eaton is a 55 y.o. male who presents for an evaluation of SVT at the request of Dr. [name]. Their medical history includes chronic diastolic heart failure, end-stage renal disease awaiting kidney transplant on home dialysis HIV, mitral regurgitation, sleep apnea on CPAP.  The patient last saw Dr. [name] December 05, 2020 for his valvular heart disease.  Dr. [name] reviewed the echo/TEE and assess the severity of his mitral valve regurgitation is moderate.  The patient also has a documented history of atrial tachycardia that is asymptomatic.  He wore a ZIO monitor recently which showed many episodes of asymptomatic A. Tach.  Today during the visit, the patient tells me that he cannot tell when he is having episodes of tachycardia.  He tells me that sometimes he can feel a flurry/fast heart rhythm but he does not feel lightheaded or dizzy.  No syncope or presyncope.  Nothing seems to trigger the episodes.  Past Medical History:  Diagnosis Date  . Anemia   . Aortic atherosclerosis ([place])   . Asthma   . Chronic diastolic CHF (congestive heart failure) ([place]) 01/06/2017   Echo 7/16 [facility] in [place], [name]) Mild AI, mild LAE, mild concentric LVH, EF 55, normal wall motion, mild to moderate MR, mild PI, RVSP 55 // Echo 10/09/16 ([facility]):  Moderate LVH, grade 2 diastolic dysfunction, mild MR, moderate LAE   . CKD (chronic kidney disease)    per pt, waiting on a kidney transplant in near future.  . Esophagitis   . Gout   . Hepatitis    Hep B  . History of nuclear stress test    a. Nuc study 7/16: no scar or ischemia, EF 42 // b. Nuc study 12/17: EF 48, ?small  apical ischemia, Low Risk  . HIV (human immunodeficiency virus infection) ([place])   . HLD (hyperlipidemia)   . Hypertension   . Hypertensive heart disease with CHF (congestive heart failure) ([place]) 10/09/2016  . Mitral regurgitation   . OSA (obstructive sleep apnea)   . Post-operative nausea and vomiting    1 time as a child  . Sleep apnea    uses c-pap  . Wears glasses   . Wears partial dentures     Past Surgical History:  Procedure Laterality Date  . AV FISTULA PLACEMENT Left 07/02/2018   Procedure: Creation of Left arm BRACHIOBASILIC ARTERIOVENOUS  FISTULA;  Surgeon: Marty Heck, MD;  Location: [place];  Service: Vascular;  Laterality: Left;  . BASCILIC VEIN TRANSPOSITION Left 08/27/2018   Procedure: Left arm BRACHIOBASILIC VEIN TRANSPOSITION SECOND STAGE;  Surgeon: Marty Heck, MD;  Location: [place];  Service: Vascular;  Laterality: Left;  . BUBBLE STUDY  09/03/2020   Procedure: BUBBLE STUDY;  Surgeon: Fay Records, MD;  Location: [place];  Service: Cardiovascular;;  . CHOLECYSTECTOMY    . COLONOSCOPY W/ BIOPSIES AND POLYPECTOMY    . GIVENS CAPSULE STUDY N/A 03/01/2018   Procedure: GIVENS CAPSULE STUDY;  Surgeon: Jerene Bears, MD;  Location: [place];  Service: Gastroenterology;  Laterality: N/A;  . HERNIA REPAIR     As baby  . MULTIPLE TOOTH EXTRACTIONS    .  NOSE SURGERY    . RENAL BIOPSY    . TEE WITHOUT CARDIOVERSION N/A 09/03/2020   Procedure: TRANSESOPHAGEAL ECHOCARDIOGRAM (TEE);  Surgeon: Fay Records, MD;  Location: [facility] ENDOSCOPY;  Service: Cardiovascular;  Laterality: N/A;    Current Medications: Current Meds  Medication Sig  . albuterol (VENTOLIN HFA) 108 (90 Base) MCG/ACT inhaler INHALE 2 PUFFS INTO THE LUNGS EVERY 4 HOURS AS NEEDED FOR WHEEZING OR SHORTNESS OF BREATH  . allopurinol (ZYLOPRIM) 100 MG tablet TAKE 1 TABLET(100 MG) BY MOUTH TWICE DAILY  . amLODipine (NORVASC) 10 MG tablet Take 1 tablet (10 mg total) by mouth daily.  Marland Kitchen azelastine  (ASTELIN) 0.1 % nasal spray Place 2 sprays into both nostrils 2 (two) times daily as needed for rhinitis. Use in each nostril as directed  . B Complex-C-Zn-Folic Acid (DIALYVITE/ZINC) TABS Take 1 tablet by mouth every evening.   . bictegravir-emtricitabine-tenofovir AF (BIKTARVY) 50-200-25 MG TABS tablet Take 1 tablet by mouth daily.  . carvedilol (COREG) 25 MG tablet TAKE 1 TABLET BY MOUTH TWICE DAILY WITH FOOD  . clonazePAM (KLONOPIN) 0.5 MG tablet Take 1 tablet (0.5 mg total) by mouth at bedtime.  Marland Kitchen doxycycline (VIBRA-TABS) 100 MG tablet Take 1 tablet (100 mg total) by mouth 2 (two) times daily.  . famotidine (PEPCID) 20 MG tablet TAKE 1 TABLET(20 MG) BY MOUTH TWICE DAILY  . FLOVENT HFA 110 MCG/ACT inhaler INHALE 2 PUFFS INTO THE LUNGS TWICE DAILY  . gabapentin (NEURONTIN) 300 MG capsule TAKE 1 CAPSULE(300 MG) BY MOUTH THREE TIMES DAILY  . hydrALAZINE (APRESOLINE) 100 MG tablet Take 1 tablet (100 mg total) by mouth 3 (three) times daily.  . hyoscyamine (LEVSIN SL) 0.125 MG SL tablet Place 1-2 tablets (0.125-0.25 mg total) under the tongue every 8 (eight) hours as needed (lower abdominal pain, spasm).  Marland Kitchen ipratropium (ATROVENT) 0.06 % nasal spray USE 2 SPRAYS IN EACH NOSTRIL 2 TO 3 TIMES DAILY AS NEEDED  . lactulose (CHRONULAC) 10 GM/15ML solution Take 20 g by mouth daily as needed for moderate constipation.   Marland Kitchen levothyroxine (SYNTHROID) 25 MCG tablet TAKE 1 TABLET BY MOUTH DAILY BEFORE BREAKFAST  . lidocaine-prilocaine (EMLA) cream Apply 1 application topically See admin instructions. Apply to access site 1-2 hours before dialysis  . lubiprostone (AMITIZA) 24 MCG capsule Take 1 capsule (24 mcg total) by mouth 2 (two) times daily with a meal. NEEDS COLONOSCOPY FOR FURTHER REFILLS  . Methoxy PEG-Epoetin Beta (MIRCERA IJ) Inject into the skin.  . mupirocin ointment (BACTROBAN) 2 % Apply 1 application topically daily as needed (apply to port after dialysis).   . Olopatadine HCl (PAZEO) 0.7 % SOLN  Place 1 drop into both eyes daily.  . sevelamer carbonate (RENVELA) 800 MG tablet Take 1,600 mg by mouth 3 (three) times daily.  Marland Kitchen Spacer/Aero-Holding Chambers DEVI 1 Device by Does not apply route 2 (two) times a day.  . VELPHORO 500 MG chewable tablet Chew 500 mg by mouth 3 (three) times daily.  . Vitamin D, Ergocalciferol, (DRISDOL) 1.25 MG (50000 UNIT) CAPS capsule TAKE 1 CAPSULE BY MOUTH EVERY 7 DAYS     Allergies:   Ace inhibitors and Lisinopril   Social History   Socioeconomic History  . Marital status: Single    Spouse name: Not on file  . Number of children: Not on file  . Years of education: Not on file  . Highest education level: Not on file  Occupational History  . Not on file  Tobacco Use  . Smoking  status: Former Smoker    Types: Cigarettes    Quit date: 2000    Years since quitting: 22.2  . Smokeless tobacco: Never Used  Vaping Use  . Vaping Use: Never used  Substance and Sexual Activity  . Alcohol use: Yes    Comment: once a month   . Drug use: No  . Sexual activity: Not Currently    Partners: Male    Birth control/protection: Condom    Comment: declined  condoms 07/11/20  Other Topics Concern  . Not on file  Social History Narrative  . Not on file   Social Determinants of Health   Financial Resource Strain: Not on file  Food Insecurity: No Food Insecurity  . Worried About Charity fundraiser in the Last Year: Never true  . Ran Out of Food in the Last Year: Never true  Transportation Needs: No Transportation Needs  . Lack of Transportation (Medical): No  . Lack of Transportation (Non-Medical): No  Physical Activity: Not on file  Stress: Not on file  Social Connections: Not on file     Family History: The patient's family history includes Heart attack (age of onset: 37) in his maternal grandmother; Heart failure in his mother; Hypertension in his brother, sister, and sister; Multiple sclerosis in his sister; Scoliosis in an other family member.  There is no history of Allergic rhinitis, Angioedema, Asthma, Eczema, Immunodeficiency, or Urticaria.  ROS:   Please see the history of present illness.    All other systems reviewed and are negative.  EKGs/Labs/Other Studies Reviewed:    The following studies were reviewed today:  12/28/2020 Zio monitor personally reviewed Sinus 60-137bpm, average 84 Overall 60-210bpm 205 episodes of SVT.  Review of morphology suggests atrial tachycardia.   January 31, 2020 SPECT  Nuclear stress EF: 51%. The left ventricular ejection fraction is mildly decreased (45-54%). The LV appears to be mildly dilated.  There was no ST segment deviation noted during stress.  This is a low risk study. There is no evidence of ischemia or previous infarction.  The study is normal.     EKG:  The ekg ordered today demonstrates sinus rhythm  Recent Labs: 05/01/2020: TSH 2.290 08/30/2020: ALT 15; B Natriuretic Peptide 1,331.1; Magnesium 1.8 09/04/2020: Hemoglobin 9.2; Platelets 217 09/28/2020: BUN 49; Creatinine, Ser 8.18; Potassium 4.2; Sodium 138  Recent Lipid Panel    Component Value Date/Time   CHOL 180 06/27/2020 0920   CHOL 180 12/09/2017 0936   TRIG 183 (H) 06/27/2020 0920   HDL 37 (L) 06/27/2020 0920   HDL 34 (L) 12/09/2017 0936   CHOLHDL 4.9 06/27/2020 0920   VLDL 29 05/25/2017 0953   LDLCALC 113 (H) 06/27/2020 0920    Physical Exam:    VS:  BP (!) 142/88   Pulse 93   Ht 5' 10"  (1.778 m)   Wt 205 lb (93 kg)   SpO2 94%   BMI 29.41 kg/m     Wt Readings from Last 3 Encounters:  01/18/21 205 lb (93 kg)  01/02/21 207 lb (93.9 kg)  12/05/20 204 lb (92.5 kg)     GEN:  Well nourished, well developed in no acute distress HEENT: Normal NECK: No JVD; No carotid bruits LYMPHATICS: No lymphadenopathy CARDIAC: RRR, no murmurs, rubs, gallops RESPIRATORY:  Clear to auscultation without rales, wheezing or rhonchi  ABDOMEN: Soft, non-tender, non-distended MUSCULOSKELETAL:  No edema; No deformity   SKIN: Warm and dry NEUROLOGIC:  Alert and oriented x 3 PSYCHIATRIC:  Normal affect  ASSESSMENT:    1. Atrial tachycardia ([place])   2. ESRD (end stage renal disease) ([place])   3. SVT (supraventricular tachycardia) ([place])   4. PAC (premature atrial contraction)   5. Mitral valve insufficiency, unspecified etiology    PLAN:    In order of problems listed above:  1. Atrial tachycardia/SVT ZIO monitor shows episodes of a regular, narrow complex tachycardia suggestive of an atrial tachycardia.  I think diagnosis of atrial flutter is much less likely.  The episodes are completely asymptomatic.  We discussed using medical therapy to help control these episodes.  Given his end-stage renal disease, the best agent would likely be amiodarone.  Given the asymptomatic nature of the events and his young age, favor conservative management for now.  If these become more of an issue during his upcoming planned nephrectomy, can certainly use a short course of amiodarone to help control the heart rhythm.  2.  End-stage renal disease on dialysis Awaiting nephrectomy next month in [place].  Currently being worked up for possible kidney transplant.  3.  Mitral valve insufficiency Followed by Dr. [name].  Follow-up as needed.  Medication Adjustments/Labs and Tests Ordered: Current medicines are reviewed at length with the patient today.  Concerns regarding medicines are outlined above.  Orders Placed This Encounter  Procedures  . EKG 12-Lead   No orders of the defined types were placed in this encounter.    Signed, Lars Mage, MD, [facility]  01/18/2021 2:06 PM    Electrophysiology [facility]

## 2021-01-21 DIAGNOSIS — Z4931 Encounter for adequacy testing for hemodialysis: Secondary | ICD-10-CM | POA: Diagnosis not present

## 2021-01-21 DIAGNOSIS — N186 End stage renal disease: Secondary | ICD-10-CM | POA: Diagnosis not present

## 2021-01-21 DIAGNOSIS — R82998 Other abnormal findings in urine: Secondary | ICD-10-CM | POA: Diagnosis not present

## 2021-01-21 DIAGNOSIS — D509 Iron deficiency anemia, unspecified: Secondary | ICD-10-CM | POA: Diagnosis not present

## 2021-01-21 DIAGNOSIS — Z79899 Other long term (current) drug therapy: Secondary | ICD-10-CM | POA: Diagnosis not present

## 2021-01-21 DIAGNOSIS — E876 Hypokalemia: Secondary | ICD-10-CM | POA: Diagnosis not present

## 2021-01-21 DIAGNOSIS — D631 Anemia in chronic kidney disease: Secondary | ICD-10-CM | POA: Diagnosis not present

## 2021-01-21 DIAGNOSIS — K7689 Other specified diseases of liver: Secondary | ICD-10-CM | POA: Diagnosis not present

## 2021-01-21 DIAGNOSIS — Z992 Dependence on renal dialysis: Secondary | ICD-10-CM | POA: Diagnosis not present

## 2021-01-21 DIAGNOSIS — N2581 Secondary hyperparathyroidism of renal origin: Secondary | ICD-10-CM | POA: Diagnosis not present

## 2021-01-21 DIAGNOSIS — E8770 Fluid overload, unspecified: Secondary | ICD-10-CM | POA: Diagnosis not present

## 2021-01-22 DIAGNOSIS — R82998 Other abnormal findings in urine: Secondary | ICD-10-CM | POA: Diagnosis not present

## 2021-01-22 DIAGNOSIS — N2581 Secondary hyperparathyroidism of renal origin: Secondary | ICD-10-CM | POA: Diagnosis not present

## 2021-01-22 DIAGNOSIS — Z992 Dependence on renal dialysis: Secondary | ICD-10-CM | POA: Diagnosis not present

## 2021-01-22 DIAGNOSIS — E876 Hypokalemia: Secondary | ICD-10-CM | POA: Diagnosis not present

## 2021-01-22 DIAGNOSIS — N186 End stage renal disease: Secondary | ICD-10-CM | POA: Diagnosis not present

## 2021-01-22 DIAGNOSIS — D631 Anemia in chronic kidney disease: Secondary | ICD-10-CM | POA: Diagnosis not present

## 2021-01-22 DIAGNOSIS — E8770 Fluid overload, unspecified: Secondary | ICD-10-CM | POA: Diagnosis not present

## 2021-01-22 DIAGNOSIS — D509 Iron deficiency anemia, unspecified: Secondary | ICD-10-CM | POA: Diagnosis not present

## 2021-01-22 DIAGNOSIS — Z79899 Other long term (current) drug therapy: Secondary | ICD-10-CM | POA: Diagnosis not present

## 2021-01-22 DIAGNOSIS — K7689 Other specified diseases of liver: Secondary | ICD-10-CM | POA: Diagnosis not present

## 2021-01-22 DIAGNOSIS — Z4931 Encounter for adequacy testing for hemodialysis: Secondary | ICD-10-CM | POA: Diagnosis not present

## 2021-01-23 DIAGNOSIS — N2581 Secondary hyperparathyroidism of renal origin: Secondary | ICD-10-CM | POA: Diagnosis not present

## 2021-01-23 DIAGNOSIS — N186 End stage renal disease: Secondary | ICD-10-CM | POA: Diagnosis not present

## 2021-01-23 DIAGNOSIS — E876 Hypokalemia: Secondary | ICD-10-CM | POA: Diagnosis not present

## 2021-01-23 DIAGNOSIS — Z992 Dependence on renal dialysis: Secondary | ICD-10-CM | POA: Diagnosis not present

## 2021-01-23 DIAGNOSIS — E8770 Fluid overload, unspecified: Secondary | ICD-10-CM | POA: Diagnosis not present

## 2021-01-24 DIAGNOSIS — Z79899 Other long term (current) drug therapy: Secondary | ICD-10-CM | POA: Diagnosis not present

## 2021-01-24 DIAGNOSIS — Z992 Dependence on renal dialysis: Secondary | ICD-10-CM | POA: Diagnosis not present

## 2021-01-24 DIAGNOSIS — D509 Iron deficiency anemia, unspecified: Secondary | ICD-10-CM | POA: Diagnosis not present

## 2021-01-24 DIAGNOSIS — Z4931 Encounter for adequacy testing for hemodialysis: Secondary | ICD-10-CM | POA: Diagnosis not present

## 2021-01-24 DIAGNOSIS — N186 End stage renal disease: Secondary | ICD-10-CM | POA: Diagnosis not present

## 2021-01-24 DIAGNOSIS — D631 Anemia in chronic kidney disease: Secondary | ICD-10-CM | POA: Diagnosis not present

## 2021-01-24 DIAGNOSIS — R82998 Other abnormal findings in urine: Secondary | ICD-10-CM | POA: Diagnosis not present

## 2021-01-24 DIAGNOSIS — K7689 Other specified diseases of liver: Secondary | ICD-10-CM | POA: Diagnosis not present

## 2021-01-24 DIAGNOSIS — E8770 Fluid overload, unspecified: Secondary | ICD-10-CM | POA: Diagnosis not present

## 2021-01-24 DIAGNOSIS — N2581 Secondary hyperparathyroidism of renal origin: Secondary | ICD-10-CM | POA: Diagnosis not present

## 2021-01-24 DIAGNOSIS — E876 Hypokalemia: Secondary | ICD-10-CM | POA: Diagnosis not present

## 2021-01-28 DIAGNOSIS — Z992 Dependence on renal dialysis: Secondary | ICD-10-CM | POA: Diagnosis not present

## 2021-01-28 DIAGNOSIS — E876 Hypokalemia: Secondary | ICD-10-CM | POA: Diagnosis not present

## 2021-01-28 DIAGNOSIS — E440 Moderate protein-calorie malnutrition: Secondary | ICD-10-CM | POA: Diagnosis not present

## 2021-01-28 DIAGNOSIS — D631 Anemia in chronic kidney disease: Secondary | ICD-10-CM | POA: Diagnosis not present

## 2021-01-28 DIAGNOSIS — Z79899 Other long term (current) drug therapy: Secondary | ICD-10-CM | POA: Diagnosis not present

## 2021-01-28 DIAGNOSIS — Z4931 Encounter for adequacy testing for hemodialysis: Secondary | ICD-10-CM | POA: Diagnosis not present

## 2021-01-28 DIAGNOSIS — E8770 Fluid overload, unspecified: Secondary | ICD-10-CM | POA: Diagnosis not present

## 2021-01-28 DIAGNOSIS — N2581 Secondary hyperparathyroidism of renal origin: Secondary | ICD-10-CM | POA: Diagnosis not present

## 2021-01-28 DIAGNOSIS — N186 End stage renal disease: Secondary | ICD-10-CM | POA: Diagnosis not present

## 2021-01-28 DIAGNOSIS — N2589 Other disorders resulting from impaired renal tubular function: Secondary | ICD-10-CM | POA: Diagnosis not present

## 2021-01-28 DIAGNOSIS — D509 Iron deficiency anemia, unspecified: Secondary | ICD-10-CM | POA: Diagnosis not present

## 2021-01-28 DIAGNOSIS — R82998 Other abnormal findings in urine: Secondary | ICD-10-CM | POA: Diagnosis not present

## 2021-01-29 DIAGNOSIS — N2589 Other disorders resulting from impaired renal tubular function: Secondary | ICD-10-CM | POA: Diagnosis not present

## 2021-01-29 DIAGNOSIS — N2581 Secondary hyperparathyroidism of renal origin: Secondary | ICD-10-CM | POA: Diagnosis not present

## 2021-01-29 DIAGNOSIS — E876 Hypokalemia: Secondary | ICD-10-CM | POA: Diagnosis not present

## 2021-01-29 DIAGNOSIS — Z4931 Encounter for adequacy testing for hemodialysis: Secondary | ICD-10-CM | POA: Diagnosis not present

## 2021-01-29 DIAGNOSIS — E8770 Fluid overload, unspecified: Secondary | ICD-10-CM | POA: Diagnosis not present

## 2021-01-29 DIAGNOSIS — D631 Anemia in chronic kidney disease: Secondary | ICD-10-CM | POA: Diagnosis not present

## 2021-01-29 DIAGNOSIS — Z79899 Other long term (current) drug therapy: Secondary | ICD-10-CM | POA: Diagnosis not present

## 2021-01-29 DIAGNOSIS — E440 Moderate protein-calorie malnutrition: Secondary | ICD-10-CM | POA: Diagnosis not present

## 2021-01-29 DIAGNOSIS — D509 Iron deficiency anemia, unspecified: Secondary | ICD-10-CM | POA: Diagnosis not present

## 2021-01-29 DIAGNOSIS — N186 End stage renal disease: Secondary | ICD-10-CM | POA: Diagnosis not present

## 2021-01-29 DIAGNOSIS — Z992 Dependence on renal dialysis: Secondary | ICD-10-CM | POA: Diagnosis not present

## 2021-01-29 DIAGNOSIS — R82998 Other abnormal findings in urine: Secondary | ICD-10-CM | POA: Diagnosis not present

## 2021-01-30 ENCOUNTER — Other Ambulatory Visit: Payer: Self-pay | Admitting: *Deleted

## 2021-01-30 MED ORDER — LUBIPROSTONE 24 MCG PO CAPS: 24.0000 ug | ORAL_CAPSULE | Freq: Two times a day (BID) | ORAL | 0 refills | Status: DC

## 2021-01-31 DIAGNOSIS — N2581 Secondary hyperparathyroidism of renal origin: Secondary | ICD-10-CM | POA: Diagnosis not present

## 2021-01-31 DIAGNOSIS — E440 Moderate protein-calorie malnutrition: Secondary | ICD-10-CM | POA: Diagnosis not present

## 2021-01-31 DIAGNOSIS — N2589 Other disorders resulting from impaired renal tubular function: Secondary | ICD-10-CM | POA: Diagnosis not present

## 2021-01-31 DIAGNOSIS — N186 End stage renal disease: Secondary | ICD-10-CM | POA: Diagnosis not present

## 2021-01-31 DIAGNOSIS — R82998 Other abnormal findings in urine: Secondary | ICD-10-CM | POA: Diagnosis not present

## 2021-01-31 DIAGNOSIS — E8770 Fluid overload, unspecified: Secondary | ICD-10-CM | POA: Diagnosis not present

## 2021-01-31 DIAGNOSIS — Z4931 Encounter for adequacy testing for hemodialysis: Secondary | ICD-10-CM | POA: Diagnosis not present

## 2021-01-31 DIAGNOSIS — Z992 Dependence on renal dialysis: Secondary | ICD-10-CM | POA: Diagnosis not present

## 2021-01-31 DIAGNOSIS — E876 Hypokalemia: Secondary | ICD-10-CM | POA: Diagnosis not present

## 2021-01-31 DIAGNOSIS — Z79899 Other long term (current) drug therapy: Secondary | ICD-10-CM | POA: Diagnosis not present

## 2021-01-31 DIAGNOSIS — D509 Iron deficiency anemia, unspecified: Secondary | ICD-10-CM | POA: Diagnosis not present

## 2021-01-31 DIAGNOSIS — D631 Anemia in chronic kidney disease: Secondary | ICD-10-CM | POA: Diagnosis not present

## 2021-02-01 DIAGNOSIS — D509 Iron deficiency anemia, unspecified: Secondary | ICD-10-CM | POA: Diagnosis not present

## 2021-02-01 DIAGNOSIS — N186 End stage renal disease: Secondary | ICD-10-CM | POA: Diagnosis not present

## 2021-02-01 DIAGNOSIS — N2589 Other disorders resulting from impaired renal tubular function: Secondary | ICD-10-CM | POA: Diagnosis not present

## 2021-02-01 DIAGNOSIS — D631 Anemia in chronic kidney disease: Secondary | ICD-10-CM | POA: Diagnosis not present

## 2021-02-01 DIAGNOSIS — Z4931 Encounter for adequacy testing for hemodialysis: Secondary | ICD-10-CM | POA: Diagnosis not present

## 2021-02-01 DIAGNOSIS — R82998 Other abnormal findings in urine: Secondary | ICD-10-CM | POA: Diagnosis not present

## 2021-02-01 DIAGNOSIS — E8770 Fluid overload, unspecified: Secondary | ICD-10-CM | POA: Diagnosis not present

## 2021-02-01 DIAGNOSIS — Z79899 Other long term (current) drug therapy: Secondary | ICD-10-CM | POA: Diagnosis not present

## 2021-02-01 DIAGNOSIS — N2581 Secondary hyperparathyroidism of renal origin: Secondary | ICD-10-CM | POA: Diagnosis not present

## 2021-02-01 DIAGNOSIS — E876 Hypokalemia: Secondary | ICD-10-CM | POA: Diagnosis not present

## 2021-02-01 DIAGNOSIS — Z992 Dependence on renal dialysis: Secondary | ICD-10-CM | POA: Diagnosis not present

## 2021-02-01 DIAGNOSIS — E440 Moderate protein-calorie malnutrition: Secondary | ICD-10-CM | POA: Diagnosis not present

## 2021-02-04 ENCOUNTER — Telehealth: Payer: Self-pay

## 2021-02-04 DIAGNOSIS — D509 Iron deficiency anemia, unspecified: Secondary | ICD-10-CM | POA: Diagnosis not present

## 2021-02-04 DIAGNOSIS — Z79899 Other long term (current) drug therapy: Secondary | ICD-10-CM | POA: Diagnosis not present

## 2021-02-04 DIAGNOSIS — E876 Hypokalemia: Secondary | ICD-10-CM | POA: Diagnosis not present

## 2021-02-04 DIAGNOSIS — N2581 Secondary hyperparathyroidism of renal origin: Secondary | ICD-10-CM | POA: Diagnosis not present

## 2021-02-04 DIAGNOSIS — Z4931 Encounter for adequacy testing for hemodialysis: Secondary | ICD-10-CM | POA: Diagnosis not present

## 2021-02-04 DIAGNOSIS — E440 Moderate protein-calorie malnutrition: Secondary | ICD-10-CM | POA: Diagnosis not present

## 2021-02-04 DIAGNOSIS — N186 End stage renal disease: Secondary | ICD-10-CM | POA: Diagnosis not present

## 2021-02-04 DIAGNOSIS — E8770 Fluid overload, unspecified: Secondary | ICD-10-CM | POA: Diagnosis not present

## 2021-02-04 DIAGNOSIS — Z992 Dependence on renal dialysis: Secondary | ICD-10-CM | POA: Diagnosis not present

## 2021-02-04 DIAGNOSIS — N2589 Other disorders resulting from impaired renal tubular function: Secondary | ICD-10-CM | POA: Diagnosis not present

## 2021-02-04 DIAGNOSIS — D631 Anemia in chronic kidney disease: Secondary | ICD-10-CM | POA: Diagnosis not present

## 2021-02-04 DIAGNOSIS — R82998 Other abnormal findings in urine: Secondary | ICD-10-CM | POA: Diagnosis not present

## 2021-02-04 NOTE — Telephone Encounter (Signed)
   [facility] Pre-operative Risk Assessment    Patient Name: [facility]  DOB: 13-Feb-1966  MRN: [id number]   HEARTCARE STAFF: - Please ensure there is not already an duplicate clearance open for this procedure. - Under Visit Info/Reason for Call, type in Other and utilize the format Clearance MM/DD/YY or Clearance TBD. Do not use dashes or single digits. - If request is for dental extraction, please clarify the # of teeth to be extracted.  Request for surgical clearance:  1. What type of surgery is being performed? Left robotic redirect nephrectomy  2. When is this surgery scheduled? 02/18/21   3. What type of clearance is required (medical clearance vs. Pharmacy clearance to hold med vs. Both)? medical  4. Are there any medications that need to be held prior to surgery and how long? none   5. Practice name and name of physician performing surgery? [facility]   6. What is the office phone number? [phone]   7.   What is the office fax number? [phone]  8.   Anesthesia type (None, local, MAC, general) ? None listed   Jacinta Shoe 02/04/2021, 4:44 PM  _________________________________________________________________   (provider comments below)

## 2021-02-05 ENCOUNTER — Other Ambulatory Visit: Payer: Self-pay | Admitting: Family Medicine

## 2021-02-05 DIAGNOSIS — E876 Hypokalemia: Secondary | ICD-10-CM | POA: Diagnosis not present

## 2021-02-05 DIAGNOSIS — E559 Vitamin D deficiency, unspecified: Secondary | ICD-10-CM

## 2021-02-05 DIAGNOSIS — E039 Hypothyroidism, unspecified: Secondary | ICD-10-CM

## 2021-02-05 DIAGNOSIS — Z992 Dependence on renal dialysis: Secondary | ICD-10-CM | POA: Diagnosis not present

## 2021-02-05 DIAGNOSIS — Z79899 Other long term (current) drug therapy: Secondary | ICD-10-CM | POA: Diagnosis not present

## 2021-02-05 DIAGNOSIS — D509 Iron deficiency anemia, unspecified: Secondary | ICD-10-CM | POA: Diagnosis not present

## 2021-02-05 DIAGNOSIS — E8770 Fluid overload, unspecified: Secondary | ICD-10-CM | POA: Diagnosis not present

## 2021-02-05 DIAGNOSIS — Z4931 Encounter for adequacy testing for hemodialysis: Secondary | ICD-10-CM | POA: Diagnosis not present

## 2021-02-05 DIAGNOSIS — N2589 Other disorders resulting from impaired renal tubular function: Secondary | ICD-10-CM | POA: Diagnosis not present

## 2021-02-05 DIAGNOSIS — R82998 Other abnormal findings in urine: Secondary | ICD-10-CM | POA: Diagnosis not present

## 2021-02-05 DIAGNOSIS — N2581 Secondary hyperparathyroidism of renal origin: Secondary | ICD-10-CM | POA: Diagnosis not present

## 2021-02-05 DIAGNOSIS — E440 Moderate protein-calorie malnutrition: Secondary | ICD-10-CM | POA: Diagnosis not present

## 2021-02-05 DIAGNOSIS — D631 Anemia in chronic kidney disease: Secondary | ICD-10-CM | POA: Diagnosis not present

## 2021-02-05 DIAGNOSIS — N186 End stage renal disease: Secondary | ICD-10-CM | POA: Diagnosis not present

## 2021-02-06 DIAGNOSIS — R82998 Other abnormal findings in urine: Secondary | ICD-10-CM | POA: Diagnosis not present

## 2021-02-06 DIAGNOSIS — E876 Hypokalemia: Secondary | ICD-10-CM | POA: Diagnosis not present

## 2021-02-06 DIAGNOSIS — Z992 Dependence on renal dialysis: Secondary | ICD-10-CM | POA: Diagnosis not present

## 2021-02-06 DIAGNOSIS — E440 Moderate protein-calorie malnutrition: Secondary | ICD-10-CM | POA: Diagnosis not present

## 2021-02-06 DIAGNOSIS — Z4931 Encounter for adequacy testing for hemodialysis: Secondary | ICD-10-CM | POA: Diagnosis not present

## 2021-02-06 DIAGNOSIS — Z79899 Other long term (current) drug therapy: Secondary | ICD-10-CM | POA: Diagnosis not present

## 2021-02-06 DIAGNOSIS — E8770 Fluid overload, unspecified: Secondary | ICD-10-CM | POA: Diagnosis not present

## 2021-02-06 DIAGNOSIS — N186 End stage renal disease: Secondary | ICD-10-CM | POA: Diagnosis not present

## 2021-02-06 DIAGNOSIS — N2589 Other disorders resulting from impaired renal tubular function: Secondary | ICD-10-CM | POA: Diagnosis not present

## 2021-02-06 DIAGNOSIS — D509 Iron deficiency anemia, unspecified: Secondary | ICD-10-CM | POA: Diagnosis not present

## 2021-02-06 DIAGNOSIS — N2581 Secondary hyperparathyroidism of renal origin: Secondary | ICD-10-CM | POA: Diagnosis not present

## 2021-02-06 DIAGNOSIS — D631 Anemia in chronic kidney disease: Secondary | ICD-10-CM | POA: Diagnosis not present

## 2021-02-07 ENCOUNTER — Ambulatory Visit: Payer: Medicare Other | Admitting: Nurse Practitioner

## 2021-02-08 DIAGNOSIS — Z992 Dependence on renal dialysis: Secondary | ICD-10-CM | POA: Diagnosis not present

## 2021-02-08 DIAGNOSIS — E440 Moderate protein-calorie malnutrition: Secondary | ICD-10-CM | POA: Diagnosis not present

## 2021-02-08 DIAGNOSIS — N2581 Secondary hyperparathyroidism of renal origin: Secondary | ICD-10-CM | POA: Diagnosis not present

## 2021-02-08 DIAGNOSIS — E8770 Fluid overload, unspecified: Secondary | ICD-10-CM | POA: Diagnosis not present

## 2021-02-08 DIAGNOSIS — N2589 Other disorders resulting from impaired renal tubular function: Secondary | ICD-10-CM | POA: Diagnosis not present

## 2021-02-08 DIAGNOSIS — Z4931 Encounter for adequacy testing for hemodialysis: Secondary | ICD-10-CM | POA: Diagnosis not present

## 2021-02-08 DIAGNOSIS — R82998 Other abnormal findings in urine: Secondary | ICD-10-CM | POA: Diagnosis not present

## 2021-02-08 DIAGNOSIS — D631 Anemia in chronic kidney disease: Secondary | ICD-10-CM | POA: Diagnosis not present

## 2021-02-08 DIAGNOSIS — N186 End stage renal disease: Secondary | ICD-10-CM | POA: Diagnosis not present

## 2021-02-08 DIAGNOSIS — R52 Pain, unspecified: Secondary | ICD-10-CM | POA: Insufficient documentation

## 2021-02-08 DIAGNOSIS — Z79899 Other long term (current) drug therapy: Secondary | ICD-10-CM | POA: Diagnosis not present

## 2021-02-08 DIAGNOSIS — E876 Hypokalemia: Secondary | ICD-10-CM | POA: Diagnosis not present

## 2021-02-08 DIAGNOSIS — D509 Iron deficiency anemia, unspecified: Secondary | ICD-10-CM | POA: Diagnosis not present

## 2021-02-08 NOTE — Telephone Encounter (Signed)
   Name: Albert [facility]  DOB: 07/07/1966  MRN: [id number]   Primary Cardiologist: [name], MD  Chart reviewed as part of pre-operative protocol coverage. Patient was contacted 02/08/2021 in reference to pre-operative risk assessment for pending surgery as outlined below.  [facility] was last seen on 01/18/21  by Dr. Quentin Ore.  Since that day, [facility] has done well from a cardiac perspective, occasional yet stable palpitations. No chest pain, sob, LLE, orthopnea, pnd. METS>4.   Therefore, based on ACC/AHA guidelines, the patient would be at acceptable risk for the planned procedure without further cardiovascular testing.   The patient was advised that if he develops new symptoms prior to surgery to contact our office to arrange for a follow-up visit, and he verbalized understanding.  I will route this recommendation to the requesting party via Epic fax function and remove from pre-op pool. Please call with questions.  Rehaan Viloria Ninfa Meeker, PA-C 02/08/2021, 2:22 PM

## 2021-02-11 ENCOUNTER — Emergency Department (HOSPITAL_COMMUNITY)
Admission: EM | Admit: 2021-02-11 | Discharge: 2021-02-11 | Disposition: A | Discharge status: Home / Self Care | Payer: Medicare Other | Attending: Emergency Medicine | Admitting: Emergency Medicine

## 2021-02-11 ENCOUNTER — Emergency Department (HOSPITAL_COMMUNITY): Payer: Medicare Other

## 2021-02-11 ENCOUNTER — Encounter (HOSPITAL_COMMUNITY): Payer: Self-pay | Admitting: Emergency Medicine

## 2021-02-11 ENCOUNTER — Other Ambulatory Visit: Payer: Self-pay

## 2021-02-11 DIAGNOSIS — R109 Unspecified abdominal pain: Secondary | ICD-10-CM | POA: Diagnosis not present

## 2021-02-11 DIAGNOSIS — J45909 Unspecified asthma, uncomplicated: Secondary | ICD-10-CM | POA: Diagnosis not present

## 2021-02-11 DIAGNOSIS — N186 End stage renal disease: Secondary | ICD-10-CM | POA: Diagnosis not present

## 2021-02-11 DIAGNOSIS — I5032 Chronic diastolic (congestive) heart failure: Secondary | ICD-10-CM | POA: Insufficient documentation

## 2021-02-11 DIAGNOSIS — I129 Hypertensive chronic kidney disease with stage 1 through stage 4 chronic kidney disease, or unspecified chronic kidney disease: Secondary | ICD-10-CM | POA: Diagnosis not present

## 2021-02-11 DIAGNOSIS — R112 Nausea with vomiting, unspecified: Secondary | ICD-10-CM | POA: Insufficient documentation

## 2021-02-11 DIAGNOSIS — I130 Hypertensive heart and chronic kidney disease with heart failure and stage 1 through stage 4 chronic kidney disease, or unspecified chronic kidney disease: Secondary | ICD-10-CM | POA: Diagnosis not present

## 2021-02-11 DIAGNOSIS — Z21 Asymptomatic human immunodeficiency virus [HIV] infection status: Secondary | ICD-10-CM | POA: Diagnosis not present

## 2021-02-11 DIAGNOSIS — R10A0 Flank pain, unspecified side: Secondary | ICD-10-CM

## 2021-02-11 DIAGNOSIS — N281 Cyst of kidney, acquired: Secondary | ICD-10-CM | POA: Diagnosis not present

## 2021-02-11 DIAGNOSIS — N189 Chronic kidney disease, unspecified: Secondary | ICD-10-CM | POA: Insufficient documentation

## 2021-02-11 DIAGNOSIS — Z87891 Personal history of nicotine dependence: Secondary | ICD-10-CM | POA: Diagnosis not present

## 2021-02-11 DIAGNOSIS — Z992 Dependence on renal dialysis: Secondary | ICD-10-CM | POA: Diagnosis not present

## 2021-02-11 DIAGNOSIS — N3289 Other specified disorders of bladder: Secondary | ICD-10-CM | POA: Diagnosis not present

## 2021-02-11 DIAGNOSIS — I2510 Atherosclerotic heart disease of native coronary artery without angina pectoris: Secondary | ICD-10-CM | POA: Diagnosis not present

## 2021-02-11 DIAGNOSIS — I10 Essential (primary) hypertension: Secondary | ICD-10-CM | POA: Diagnosis not present

## 2021-02-11 LAB — URINALYSIS, ROUTINE W REFLEX MICROSCOPIC
Bacteria, UA: NONE SEEN
Bilirubin Urine: NEGATIVE
Glucose, UA: 50 mg/dL — AB
Hgb urine dipstick: NEGATIVE
Ketones, ur: NEGATIVE mg/dL
Leukocytes,Ua: NEGATIVE
Nitrite: NEGATIVE
Protein, ur: 100 mg/dL — AB
Specific Gravity, Urine: 1.008 (ref 1.005–1.030)
pH: 7 (ref 5.0–8.0)

## 2021-02-11 LAB — CBC
HCT: 26.9 % — ABNORMAL LOW (ref 39.0–52.0)
Hemoglobin: 9.5 g/dL — ABNORMAL LOW (ref 13.0–17.0)
MCH: 35.2 pg — ABNORMAL HIGH (ref 26.0–34.0)
MCHC: 35.3 g/dL (ref 30.0–36.0)
MCV: 99.6 fL (ref 80.0–100.0)
Platelets: 206 10*3/uL (ref 150–400)
RBC: 2.7 MIL/uL — ABNORMAL LOW (ref 4.22–5.81)
RDW: 12.8 % (ref 11.5–15.5)
WBC: 8.8 10*3/uL (ref 4.0–10.5)
nRBC: 0 % (ref 0.0–0.2)

## 2021-02-11 LAB — COMPREHENSIVE METABOLIC PANEL
ALT: 14 U/L (ref 0–44)
AST: 15 U/L (ref 15–41)
Albumin: 4 g/dL (ref 3.5–5.0)
Alkaline Phosphatase: 46 U/L (ref 38–126)
Anion gap: 17 — ABNORMAL HIGH (ref 5–15)
BUN: 69 mg/dL — ABNORMAL HIGH (ref 6–20)
CO2: 25 mmol/L (ref 22–32)
Calcium: 9.6 mg/dL (ref 8.9–10.3)
Chloride: 95 mmol/L — ABNORMAL LOW (ref 98–111)
Creatinine, Ser: 11.28 mg/dL — ABNORMAL HIGH (ref 0.61–1.24)
GFR, Estimated: 5 mL/min — ABNORMAL LOW (ref >60)
Glucose, Bld: 117 mg/dL — ABNORMAL HIGH (ref 70–99)
Potassium: 3.3 mmol/L — ABNORMAL LOW (ref 3.5–5.1)
Sodium: 137 mmol/L (ref 135–145)
Total Bilirubin: 0.6 mg/dL (ref 0.3–1.2)
Total Protein: 6.9 g/dL (ref 6.5–8.1)

## 2021-02-11 LAB — LIPASE, BLOOD: Lipase: 79 U/L — ABNORMAL HIGH (ref 11–51)

## 2021-02-11 LAB — TROPONIN I (HIGH SENSITIVITY): Troponin I (High Sensitivity): 61 ng/L — ABNORMAL HIGH (ref <18)

## 2021-02-11 IMAGING — CT CT RENAL STONE PROTOCOL
2 of 4 series · 15 of 46 positions shown, 17 images · non-contrast
Comparison: CT [DATE], MR imaging [DATE]

CLINICAL DATA: Flank pain, stone disease suspected, left flank pain
with emesis, history of HIV, hepatitis B, CKD, soft RAKUMA thus,
prior hernia repair and cholecystectomy.

EXAM:
CT ABDOMEN AND PELVIS WITHOUT CONTRAST
TECHNIQUE: Multidetector CT imaging of the abdomen and pelvis was performed
following the standard protocol without IV contrast.

[Series 3: renal stone 5.0 · axial · 0.81mm/px · z∈[+599,+1069]mm · 12 of 104 slices shown, 14 images]
[im 5/104  soft-tissue]
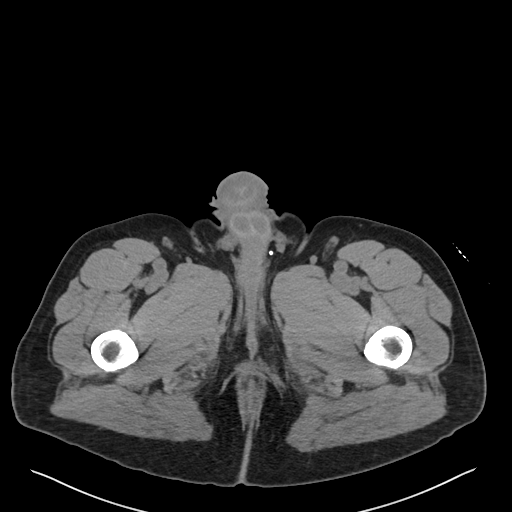
[im 5/104  bone]
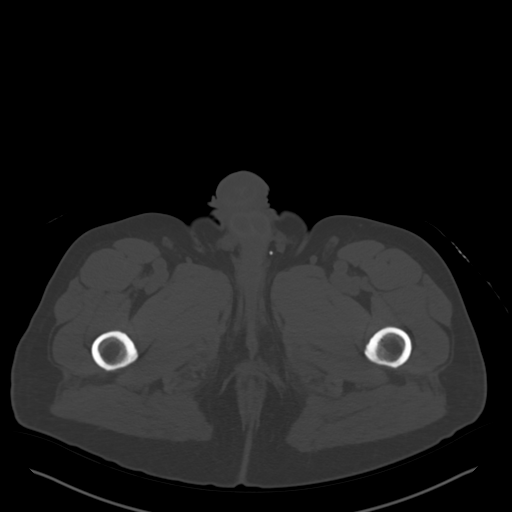
[im 13/104  soft-tissue]
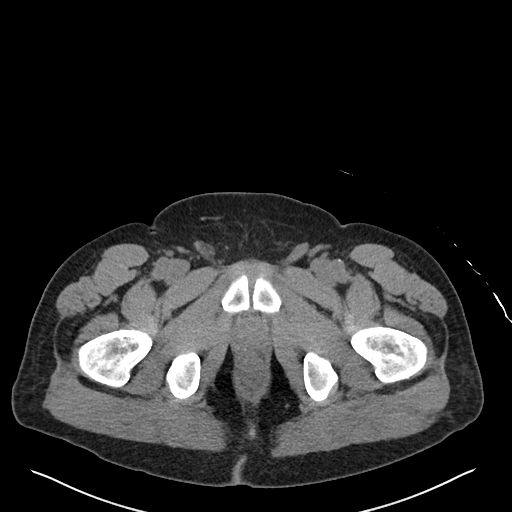
[im 22/104  soft-tissue]
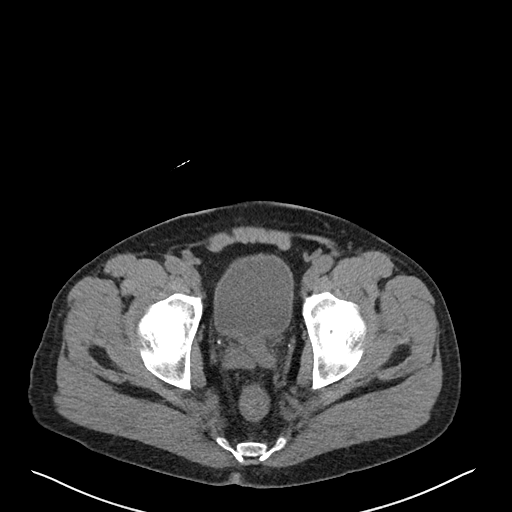
[im 31/104  soft-tissue]
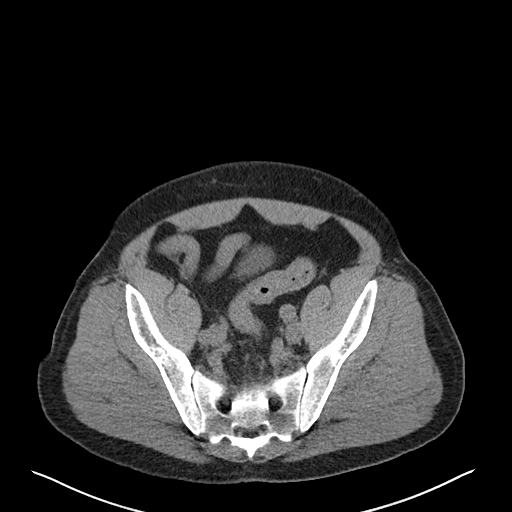
[im 39/104  soft-tissue]
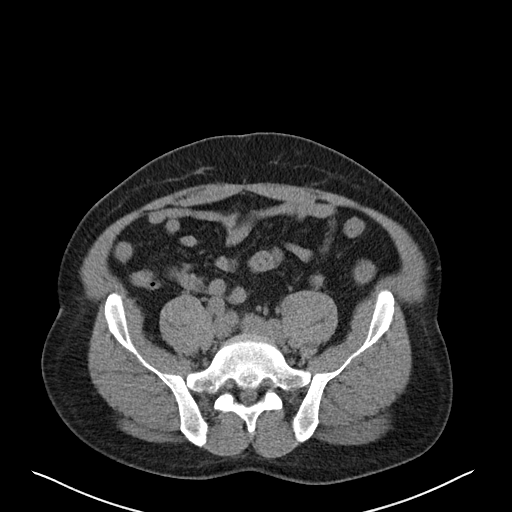
[im 48/104  soft-tissue]
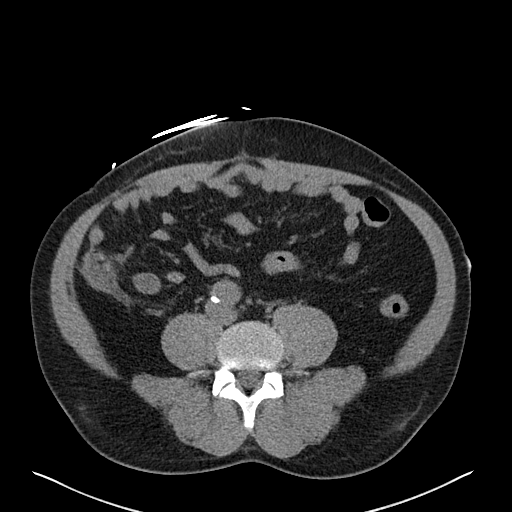
[im 56/104  soft-tissue]
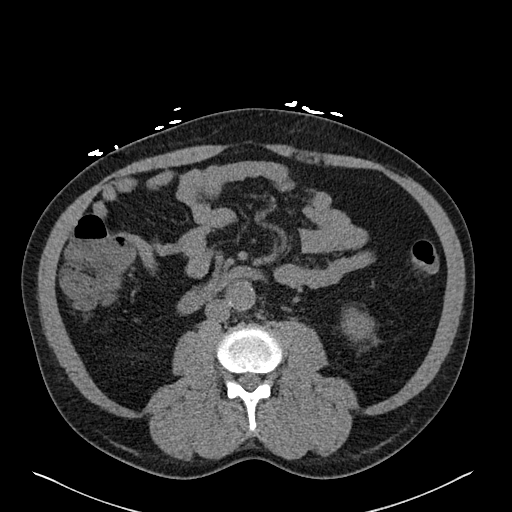
[im 65/104  soft-tissue]
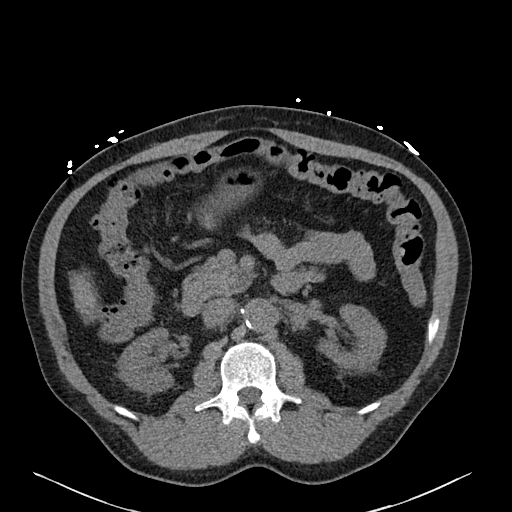
[im 73/104  soft-tissue]
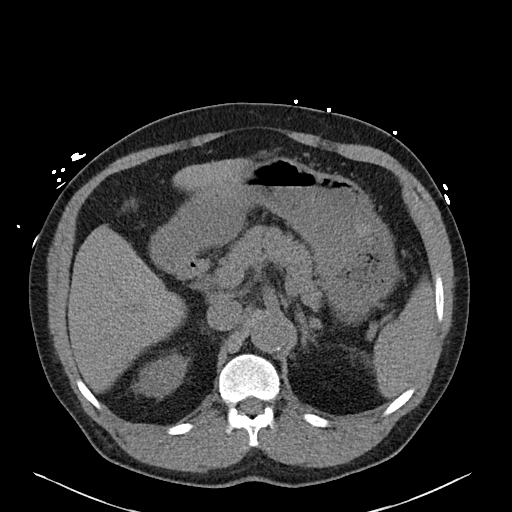
[im 73/104  bone]
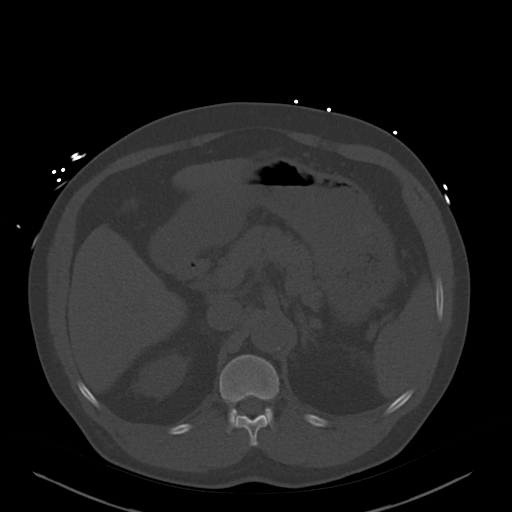
[im 82/104  soft-tissue]
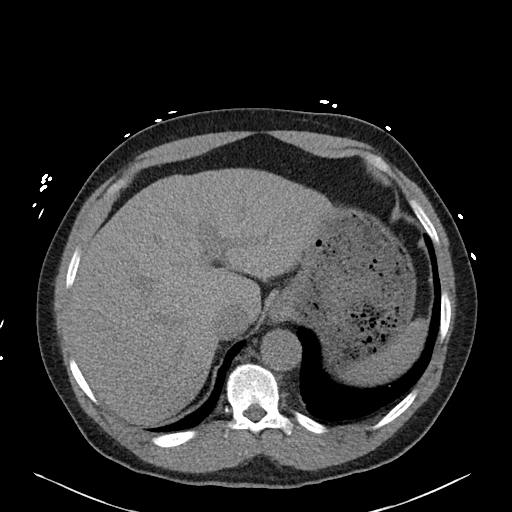
[im 91/104  soft-tissue]
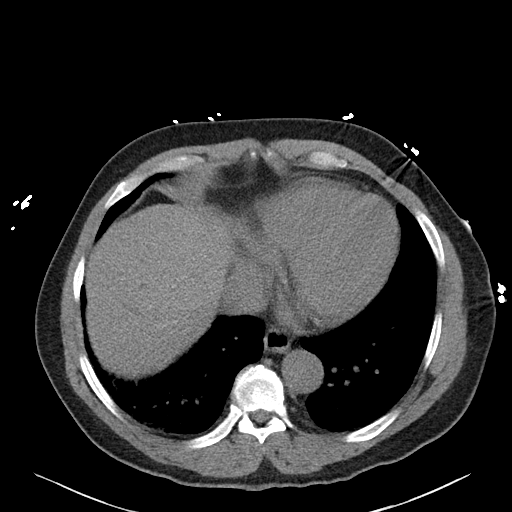
[im 99/104  soft-tissue]
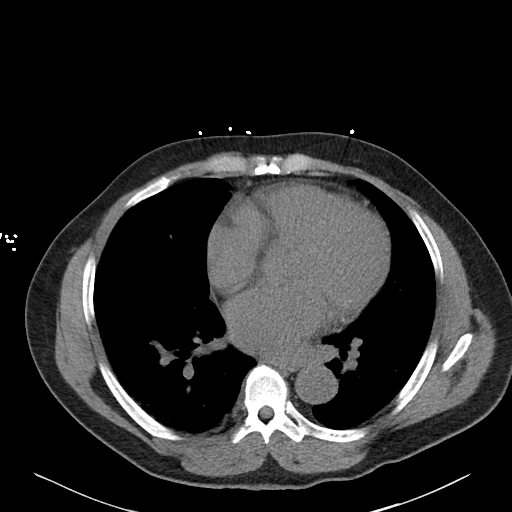

[Series 6: coronal · coronal · 0.82mm/px · 3 of 102 slices shown]
[im 34/102  soft-tissue]
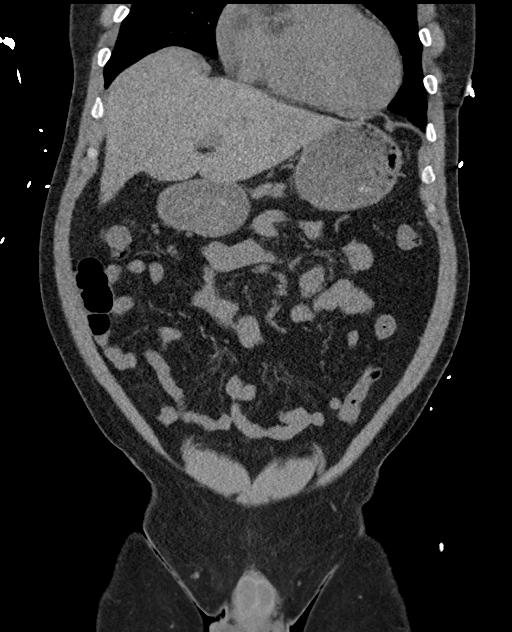
[im 45/102  soft-tissue]
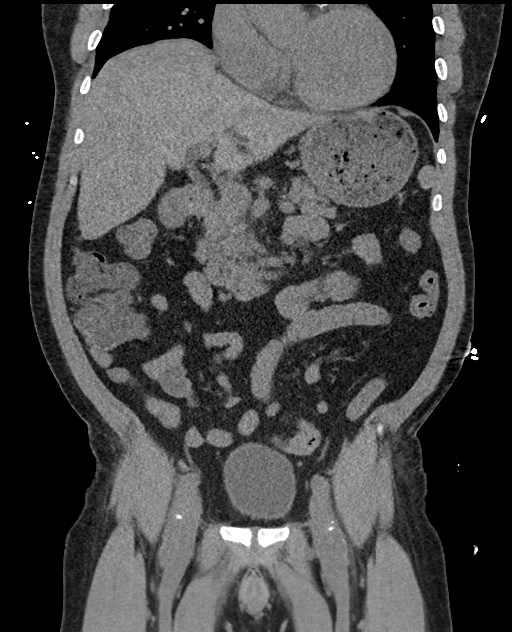
[im 57/102  soft-tissue]
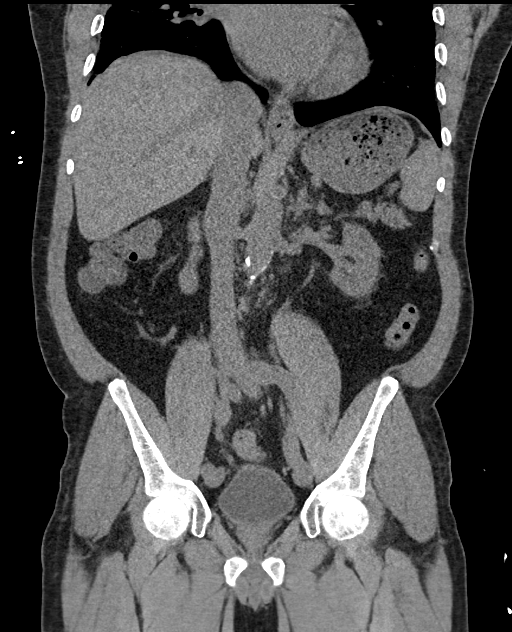

[15 of 46 positions shown; findings below may reference images not displayed]

FINDINGS: Lower chest: Some subpleural reticular changes in the right lung
base and to a lesser extent the left lung likely reflect atelectatic
changes with diminished volumes. Borderline cardiomegaly. No
pericardial effusion. Coronary artery calcifications are present.
Slight hypoattenuation of the cardiac blood pool or reflect a mild
anemia.

Hepatobiliary: No worrisome focal liver lesions. Smooth liver
surface contour. Normal hepatic attenuation. Prior cholecystectomy.

Pancreas: No pancreatic ductal dilatation or surrounding
inflammatory changes.

Spleen: Normal in size. No concerning splenic lesions.

Adrenals/Urinary Tract: Normal adrenal glands. Kidneys are symmetric
in size and normally located. Stable moderate symmetric bilateral
perinephric stranding, a nonspecific finding which may correlate
with decreased renal function. Multiple hypoattenuating foci are
present throughout both kidneys. A slightly more hyperattenuating
focus is seen in the lower pole right kidney measuring up to 7 mm in
size (3/45), poorly assessed on comparison MR imaging due to motion
artifact though not significantly changed from prior CT and favoring
a benign proteinaceous cyst. No visible urolithiasis or
hydronephrosis stable phleboliths are seen in the deep pelvis. No
gross bladder abnormality accounting for underdistention.

Stomach/Bowel: Distal esophagus, stomach and duodenal sweep are
unremarkable. No small bowel wall thickening or dilatation. No
evidence of obstruction. A normal appendix is visualized. No colonic
dilatation or wall thickening.

Vascular/Lymphatic: Atherosclerotic calcifications within the
abdominal aorta and branch vessels. No aneurysm or ectasia. Limited
assessment of the vasculature on unenhanced CT. No enlarged
abdominopelvic lymph nodes.

Reproductive: Borderline prostatomegaly. Stable benign eccentric
calcifications of the prostate.

Other: No abdominopelvic free fluid or free gas. No bowel containing
hernias. Small bilateral fat containing inguinal hernias.

Musculoskeletal: No acute osseous abnormality or suspicious osseous
lesion.
IMPRESSION: 1. Stable moderate symmetric bilateral perinephric stranding, a
nonspecific finding which may correlate with decreased renal
function though should correlate with urinalysis to exclude a
superimposed infection.
2. No other acute CT findings to explain the patient's left flank
pain. Specifically, no visible urolithiasis or hydronephrosis.
3. Multiple hypoattenuating foci throughout both kidneys, shown to
reflect cyst on comparison MR imaging. A 7 mm hyperattenuating focus
in the lower pole right kidney is poorly assessed on comparison MR
imaging due to motion artifact though not significantly changed from
prior CT and favoring a benign proteinaceous cyst.
4. Aortic Atherosclerosis ([IR]-[IR]).

## 2021-02-11 IMAGING — CT CT ANGIO CHEST-ABD-PELV FOR DISSECTION W/ AND WO/W CM
2 of 7 series · 14 of 46 positions shown, 16 images · IV contrast (OMNI 350)
Comparison: Noncontrast abdominal CT from earlier today

CLINICAL DATA: Left flank pain with emesis, aortic dissection
suspected. HIV and end-stage renal disease

EXAM:
CT ANGIOGRAPHY CHEST, ABDOMEN AND PELVIS
TECHNIQUE: Non-contrast CT of the chest was initially obtained.

[Series 7: dissection 2mm · axial · 0.81mm/px · z∈[+651,+1235]mm · 11 of 328 slices shown, 13 images]
[im 18/328  soft-tissue]
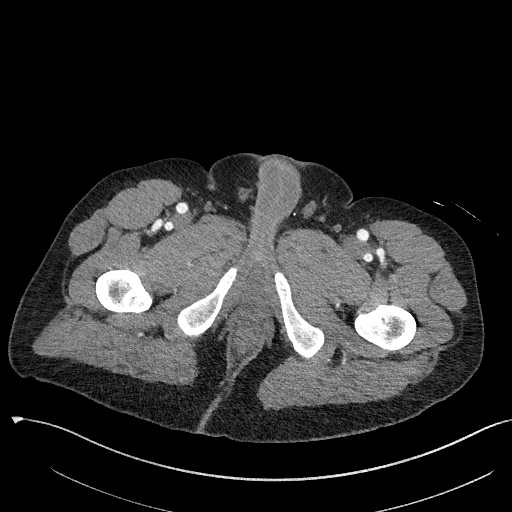
[im 18/328  bone]
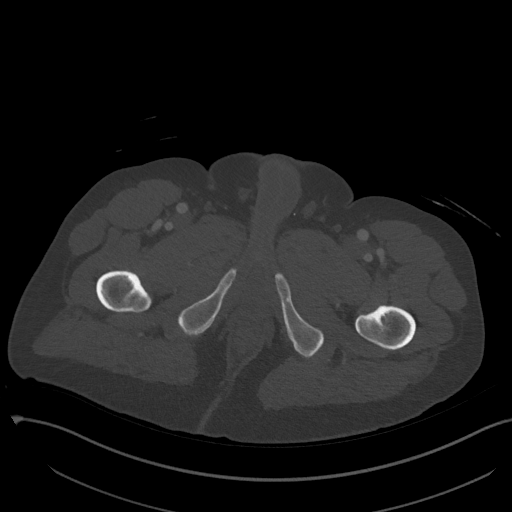
[im 52/328  soft-tissue]
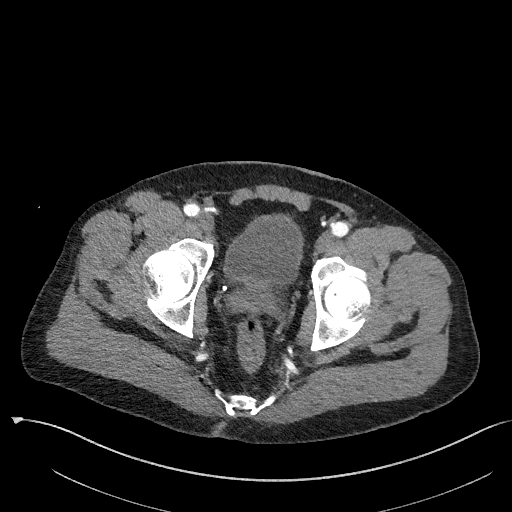
[im 87/328  soft-tissue]
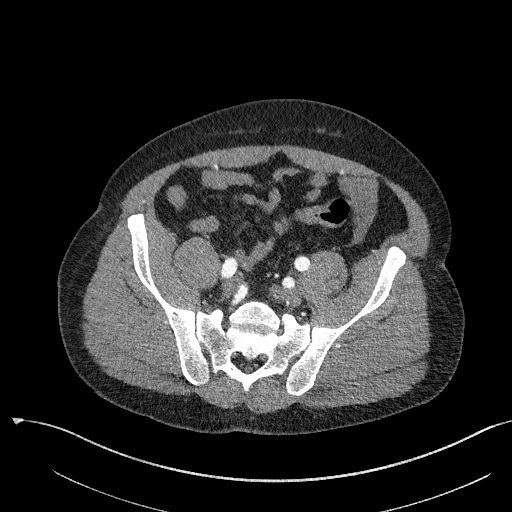
[im 104/328  soft-tissue]
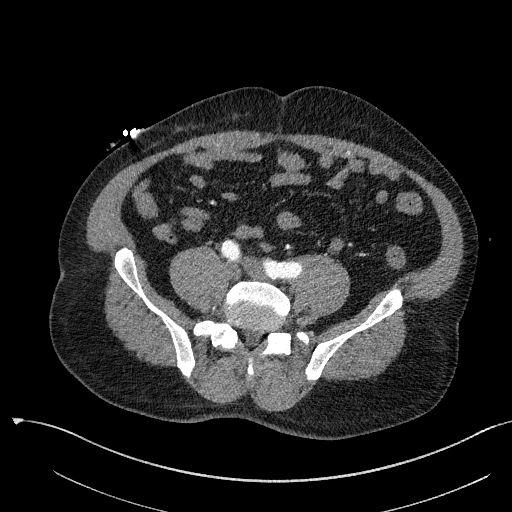
[im 138/328  soft-tissue]
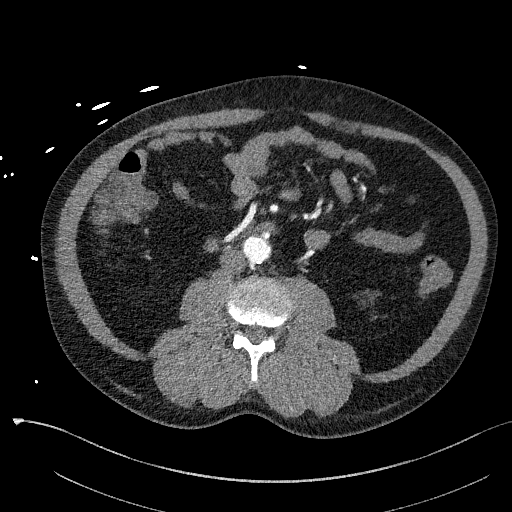
[im 173/328  soft-tissue]
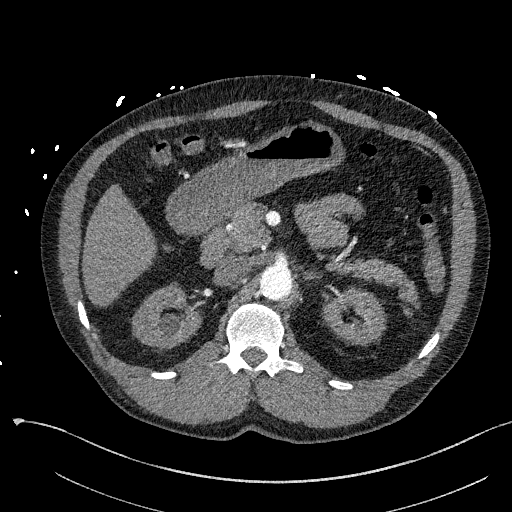
[im 190/328  soft-tissue]
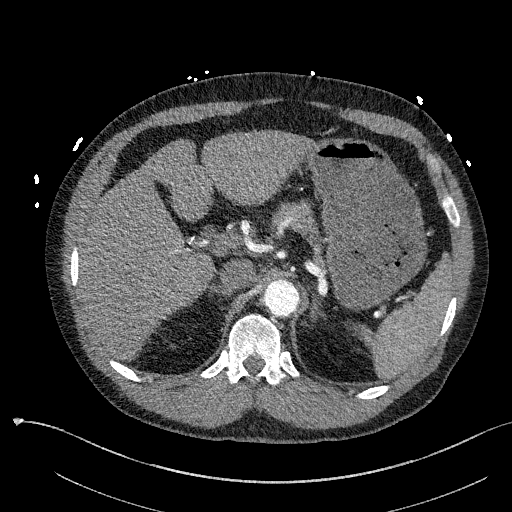
[im 224/328  soft-tissue]
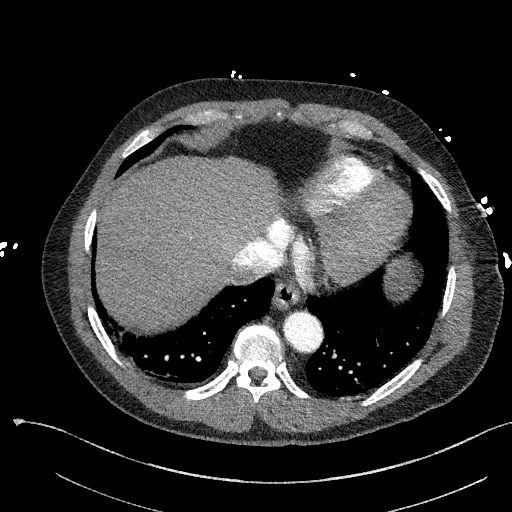
[im 241/328  soft-tissue]
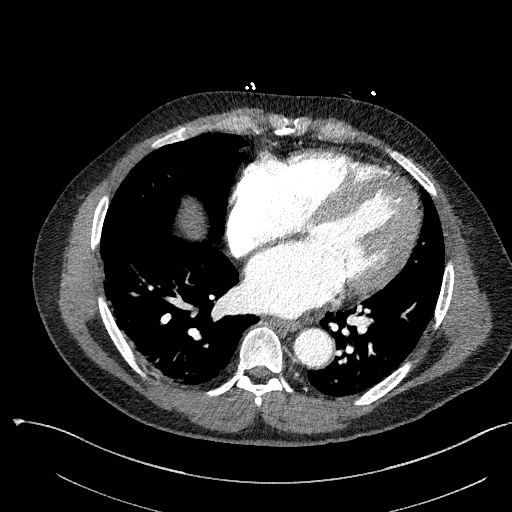
[im 241/328  bone]
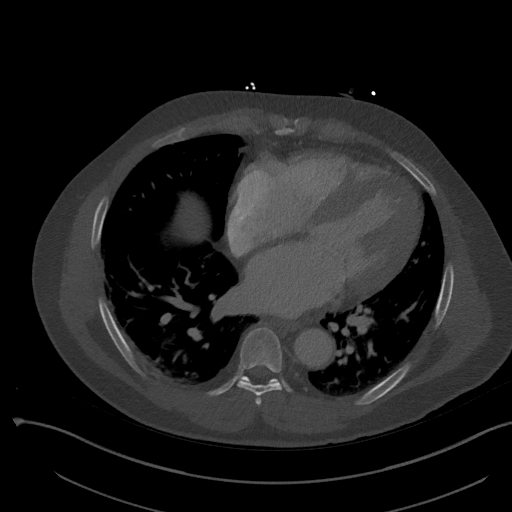
[im 276/328  soft-tissue]
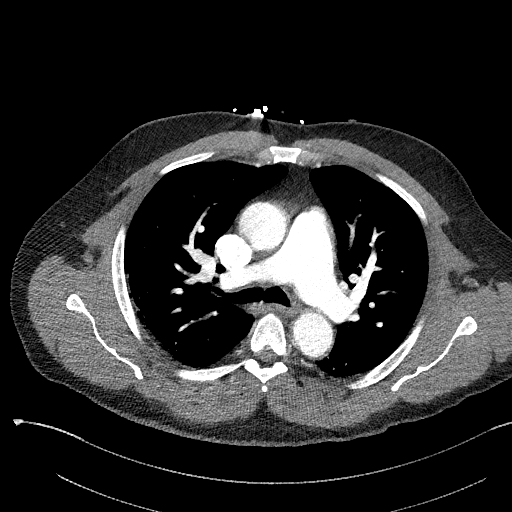
[im 310/328  soft-tissue]
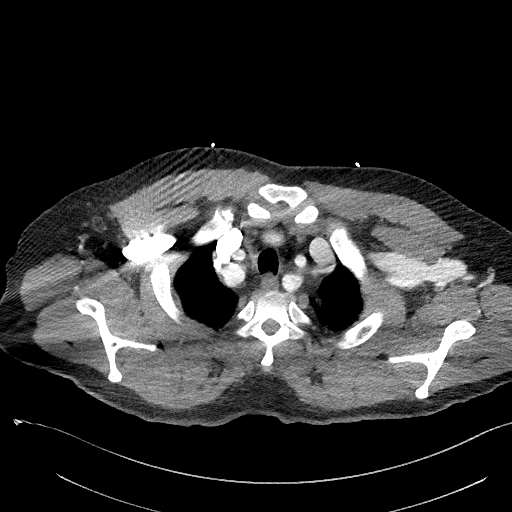

[Series 10: dissection 2mm cor · coronal · 0.82mm/px · 3 of 151 slices shown]
[im 38/151  soft-tissue]
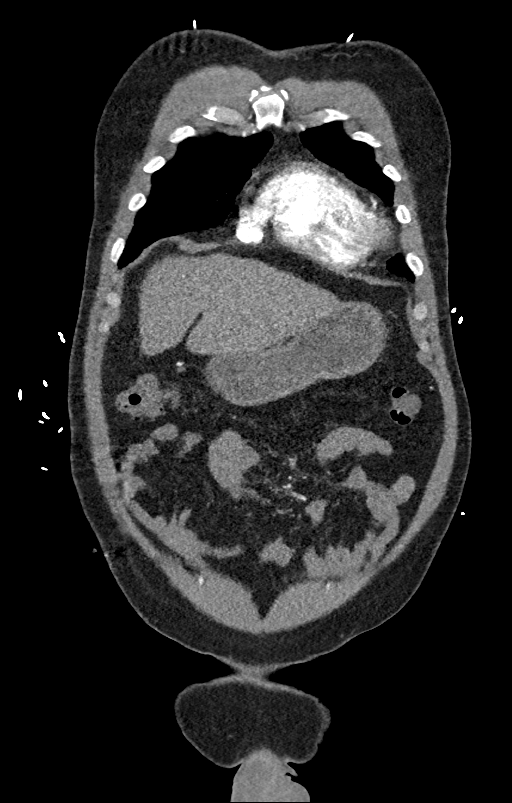
[im 76/151  soft-tissue]
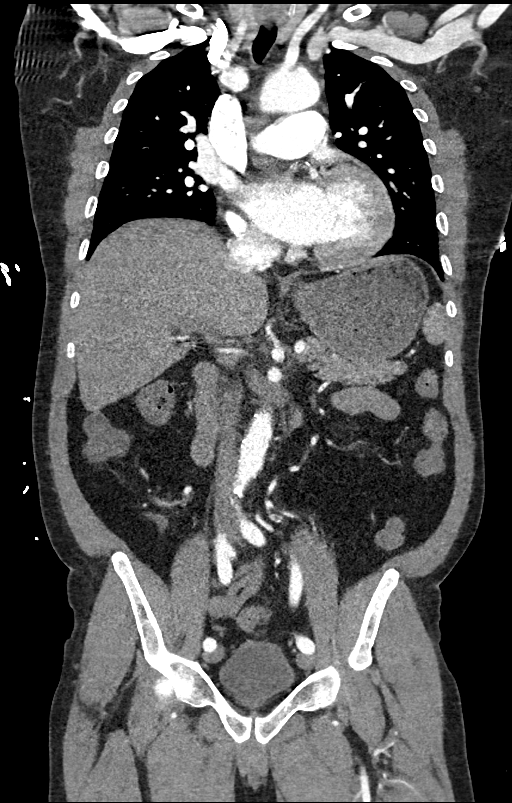
[im 113/151  soft-tissue]
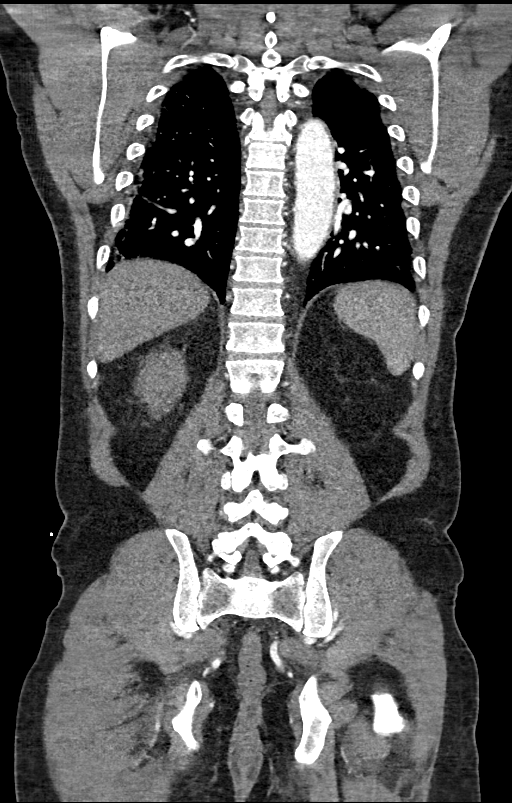

[14 of 46 positions shown; findings below may reference images not displayed]

Multidetector CT imaging through the chest, abdomen and pelvis was
performed using the standard protocol during bolus administration of
intravenous contrast. Multiplanar reconstructed images and MIPs were
obtained and reviewed to evaluate the vascular anatomy.

CONTRAST:  100mL OMNIPAQUE IOHEXOL 350 MG/ML SOLN
FINDINGS: CTA CHEST FINDINGS

Cardiovascular: Satisfactory opacification of the pulmonary arteries
to the segmental level. No evidence of pulmonary embolism. Enlarged
heart size. No pericardial effusion. Coronary calcification seen in
both the left and right circulation. No pulmonary artery filling
defect. Asymmetric enhancing left upper extremity deep veins,
contralateral to the side of injection and presumably from a
dialysis fistula.

Mediastinum/Nodes: No hematoma or pneumomediastinum.  No adenopathy

Lungs/Pleura: Mild atelectasis. There is no edema, consolidation,
effusion, or pneumothorax.

Musculoskeletal: Negative

Review of the MIP images confirms the above findings.

CTA ABDOMEN AND PELVIS FINDINGS

VASCULAR

Aorta: Atheromatous wall thickening and calcification

Celiac: Vessels are smooth and widely patent.

SMA: Vessels are smooth and widely patent

Renals: Early branching right renal artery. Vessels are smooth and
widely patent.

IMA: Diffusely patent

Inflow: Mild atheromatous changes and tortuosity. No stenosis or
dissection

Veins: Negative in the arterial phase

Review of the MIP images confirms the above findings.

NON-VASCULAR

Hepatobiliary: No focal liver abnormality.Cholecystectomy. No bile
duct dilatation

Pancreas: Unremarkable.

Spleen: Unremarkable.

Adrenals/Urinary Tract: Negative adrenals. Atrophic kidneys in the
setting of end-stage renal disease. Cystic densities bilaterally.
The suspected proteinaceous cyst on prior noncontrast CT is not
further evaluated on this arterial study. No hydronephrosis.
Borderline circumferential bladder wall thickening.

Stomach/Bowel:  No obstruction. No visible bowel inflammation.

Lymphatic: No mass or adenopathy.

Reproductive:No pathologic findings.

Other: No ascites or pneumoperitoneum.

Musculoskeletal: No acute abnormalities.

Review of the MIP images confirms the above findings.
IMPRESSION: 1. No evidence of acute aortic syndrome. No specific explanation for
symptoms.
2.  Aortic Atherosclerosis ([HY]-[HY]).  Coronary atherosclerosis.
3. Cardiomegaly.

## 2021-02-11 MED ORDER — HYDROMORPHONE HCL 1 MG/ML IJ SOLN
1.0000 mg | Freq: Once | INTRAMUSCULAR | Status: AC
Start: 2021-02-11 — End: 2021-02-11
  Administered 2021-02-11: 1 mg via INTRAVENOUS
  Filled 2021-02-11: qty 1

## 2021-02-11 MED ORDER — SODIUM CHLORIDE 0.9 % IV BOLUS
1000.0000 mL | Freq: Once | INTRAVENOUS | Status: AC
  Administered 2021-02-11: 1000 mL via INTRAVENOUS

## 2021-02-11 MED ORDER — IOHEXOL 350 MG/ML SOLN
100.0000 mL | Freq: Once | INTRAVENOUS | Status: AC | PRN
  Administered 2021-02-11: 100 mL via INTRAVENOUS

## 2021-02-11 MED ORDER — LORAZEPAM 2 MG/ML IJ SOLN
1.0000 mg | Freq: Once | INTRAMUSCULAR | Status: AC
  Administered 2021-02-11: 1 mg via INTRAVENOUS
  Filled 2021-02-11: qty 1

## 2021-02-11 MED ORDER — METHOCARBAMOL 500 MG PO TABS: 500.0000 mg | ORAL_TABLET | Freq: Three times a day (TID) | ORAL | 0 refills | Status: DC | PRN

## 2021-02-11 NOTE — Discharge Instructions (Addendum)
You were evaluated in the Emergency Department and after careful evaluation, we did not find any emergent condition requiring admission or further testing in the hospital.  Your exam/testing today was overall reassuring.  CT scans did not show any emergent conditions.  Suspect pain related to muscle strain or spasm.  Recommend Tylenol at home for pain.  Can also use the Robaxin muscle relaxer provided.  Please return to the Emergency Department if you experience any worsening of your condition.  Thank you for allowing [place] to be a part of your care.

## 2021-02-11 NOTE — ED Triage Notes (Signed)
Patient reports left flank pain with emesis onset last night , hemodialysis q Mon/Wed/Fri , denies hematuria , no injury or fall .

## 2021-02-11 NOTE — ED Provider Notes (Signed)
[facility] Emergency [facility] Provider Note MRN:  [id number]  Arrival date & time: 02/11/21     Chief Complaint   Flank Pain   History of Present Illness   Al[facility] is a 55 y.o. year-old male with a history of HIV, ESRD presenting to the ED with chief complaint of flank pain.  Location: Left flank Duration: 6 hours Onset: Gradual, progressively worsening Timing: Constant pain Description: Dull ache Severity: Severe Exacerbating/Alleviating Factors: Worse with certain movements or positions Associated Symptoms: Nausea and episodes of nonbloody nonbilious emesis Pertinent Negatives: Denies fever, no dysuria or hematuria but some increased urinary frequency   Review of Systems  A complete 10 system review of systems was obtained and all systems are negative except as noted in the HPI and PMH.   Patient's Health History    Past Medical History:  Diagnosis Date  . Anemia   . Aortic atherosclerosis ([place])   . Asthma   . Chronic diastolic CHF (congestive heart failure) ([place]) 01/06/2017   Echo 7/16 [facility] in [place], [name]) Mild AI, mild LAE, mild concentric LVH, EF 55, normal wall motion, mild to moderate MR, mild PI, RVSP 55 // Echo 10/09/16 ([facility]):  Moderate LVH, grade 2 diastolic dysfunction, mild MR, moderate LAE   . CKD (chronic kidney disease)    per pt, waiting on a kidney transplant in near future.  . Esophagitis   . Gout   . Hepatitis    Hep B  . History of nuclear stress test    a. Nuc study 7/16: no scar or ischemia, EF 42 // b. Nuc study 12/17: EF 48, ?small apical ischemia, Low Risk  . HIV (human immunodeficiency virus infection) ([place])   . HLD (hyperlipidemia)   . Hypertension   . Hypertensive heart disease with CHF (congestive heart failure) ([employer]) 10/09/2016  . Mitral regurgitation   . OSA (obstructive sleep apnea)   . Post-operative nausea and vomiting    1 time as a child  . Sleep apnea    uses c-pap  . Wears  glasses   . Wears partial dentures     Past Surgical History:  Procedure Laterality Date  . AV FISTULA PLACEMENT Left 07/02/2018   Procedure: Creation of Left arm BRACHIOBASILIC ARTERIOVENOUS  FISTULA;  Surgeon: Marty Heck, MD;  Location: [facility];  Service: Vascular;  Laterality: Left;  . BASCILIC VEIN TRANSPOSITION Left 08/27/2018   Procedure: Left arm BRACHIOBASILIC VEIN TRANSPOSITION SECOND STAGE;  Surgeon: Marty Heck, MD;  Location: [facility];  Service: Vascular;  Laterality: Left;  . BUBBLE STUDY  09/03/2020   Procedure: BUBBLE STUDY;  Surgeon: Fay Records, MD;  Location: [facility];  Service: Cardiovascular;;  . CHOLECYSTECTOMY    . COLONOSCOPY W/ BIOPSIES AND POLYPECTOMY    . GIVENS CAPSULE STUDY N/A 03/01/2018   Procedure: GIVENS CAPSULE STUDY;  Surgeon: Jerene Bears, MD;  Location: [facility];  Service: Gastroenterology;  Laterality: N/A;  . HERNIA REPAIR     As baby  . MULTIPLE TOOTH EXTRACTIONS    . NOSE SURGERY    . RENAL BIOPSY    . TEE WITHOUT CARDIOVERSION N/A 09/03/2020   Procedure: TRANSESOPHAGEAL ECHOCARDIOGRAM (TEE);  Surgeon: Fay Records, MD;  Location: [facility];  Service: Cardiovascular;  Laterality: N/A;    Family History  Problem Relation Age of Onset  . Heart failure Mother   . Heart attack Maternal Grandmother 53  . Hypertension Sister   . Multiple sclerosis Sister   .  Hypertension Brother   . Hypertension Sister   . Scoliosis Other   . Allergic rhinitis Neg Hx   . Angioedema Neg Hx   . Asthma Neg Hx   . Eczema Neg Hx   . Immunodeficiency Neg Hx   . Urticaria Neg Hx     Social History   Socioeconomic History  . Marital status: Single    Spouse name: Not on file  . Number of children: Not on file  . Years of education: Not on file  . Highest education level: Not on file  Occupational History  . Not on file  Tobacco Use  . Smoking status: Former Smoker    Types: Cigarettes    Quit date: 2000    Years since quitting: 22.3   . Smokeless tobacco: Never Used  Vaping Use  . Vaping Use: Never used  Substance and Sexual Activity  . Alcohol use: Yes    Comment: once a month   . Drug use: No  . Sexual activity: Not Currently    Partners: Male    Birth control/protection: Condom    Comment: declined  condoms 07/11/20  Other Topics Concern  . Not on file  Social History Narrative  . Not on file   Social Determinants of Health   Financial Resource Strain: Not on file  Food Insecurity: No Food Insecurity  . Worried About Charity fundraiser in the Last Year: Never true  . Ran Out of Food in the Last Year: Never true  Transportation Needs: No Transportation Needs  . Lack of Transportation (Medical): No  . Lack of Transportation (Non-Medical): No  Physical Activity: Not on file  Stress: Not on file  Social Connections: Not on file  Intimate Partner Violence: Not on file     Physical Exam   Vitals:   02/11/21 0428 02/11/21 0545  BP: (!) 142/92 132/90  Pulse: 86 81  Resp: 18 13  Temp: 98.6 F (37 C)   SpO2: 97% 94%    CONSTITUTIONAL: Well-appearing, NAD NEURO:  Alert and oriented x 3, no focal deficits EYES:  eyes equal and reactive ENT/NECK:  no LAD, no JVD CARDIO: Regular rate, well-perfused, normal S1 and S2 PULM:  CTAB no wheezing or rhonchi GI/GU:  normal bowel sounds, non-distended, non-tender; left CVA tenderness MSK/SPINE:  No gross deformities, no edema SKIN:  no rash, atraumatic PSYCH:  Appropriate speech and behavior  *Additional and/or pertinent findings included in MDM below  Diagnostic and Interventional Summary    EKG Interpretation  Date/Time:  Monday February 11 2021 02:56:54 EDT Ventricular Rate:  86 PR Interval:  174 QRS Duration: 98 QT Interval:  409 QTC Calculation: 490 R Axis:   41 Text Interpretation: Sinus rhythm Borderline T wave abnormalities Borderline prolonged QT interval Confirmed by Gerlene Fee [id number]) on 02/11/2021 3:28:55 AM      Labs Reviewed  CBC -  Abnormal; Notable for the following components:      Result Value   RBC 2.70 (*)    Hemoglobin 9.5 (*)    HCT 26.9 (*)    MCH 35.2 (*)    All other components within normal limits  COMPREHENSIVE METABOLIC PANEL - Abnormal; Notable for the following components:   Potassium 3.3 (*)    Chloride 95 (*)    Glucose, Bld 117 (*)    BUN 69 (*)    Creatinine, Ser 11.28 (*)    GFR, Estimated 5 (*)    Anion gap 17 (*)  All other components within normal limits  LIPASE, BLOOD - Abnormal; Notable for the following components:   Lipase 79 (*)    All other components within normal limits  URINALYSIS, ROUTINE W REFLEX MICROSCOPIC - Abnormal; Notable for the following components:   Color, Urine STRAW (*)    Glucose, UA 50 (*)    Protein, ur 100 (*)    All other components within normal limits  TROPONIN I (HIGH SENSITIVITY) - Abnormal; Notable for the following components:   Troponin I (High Sensitivity) 61 (*)    All other components within normal limits  TROPONIN I (HIGH SENSITIVITY)    CT Angio Chest/Abd/Pel for Dissection W and/or Wo Contrast  Final Result    CT RENAL STONE STUDY  Final Result      Medications  HYDROmorphone (DILAUDID) injection 1 mg (1 mg Intravenous Given 02/11/21 0256)  sodium chloride 0.9 % bolus 1,000 mL (0 mLs Intravenous Stopped 02/11/21 0431)  LORazepam (ATIVAN) injection 1 mg (1 mg Intravenous Given 02/11/21 0441)  iohexol (OMNIPAQUE) 350 MG/ML injection 100 mL (100 mLs Intravenous Contrast Given 02/11/21 0501)     Procedures  /  Critical Care Procedures  ED Course and Medical Decision Making  I have reviewed the triage vital signs, the nursing notes, and pertinent available records from the EMR.  Listed above are laboratory and imaging tests that I personally ordered, reviewed, and interpreted and then considered in my medical decision making (see below for details).  CT to evaluate for stone is unremarkable.  Patient continues to have a lot of pain and  is nauseated.  Differential diagnosis expanding to atypical presentation of ACS, dissection, diverticulitis.  Decision made to obtain CT imaging with contrast to ensure no emergent condition.  Provided Ativan for nausea given long QT history with a thought that the benzodiazepine will also help if pain is due to musculoskeletal spasm.     Repeat CT imaging is again unremarkable.  Patient has a history of a proteinaceous cyst on the left kidney but it appears unchanged, no evidence of hemorrhage or complication.  This could be contributing to the symptoms however.  Nothing to indicate further testing or admission, appropriate for discharge.  Has close follow-up for nephrectomy next week.  Barth Kirks. [place] Small, [place] [email or web address]  Final Clinical Impressions(s) / ED Diagnoses     ICD-10-CM   1. Flank pain  R10.9     ED Discharge Orders         Ordered    methocarbamol (ROBAXIN) 500 MG tablet  Every 8 hours PRN        02/11/21 0629           Discharge Instructions Discussed with and Provided to Patient:     Discharge Instructions     You were evaluated in the Emergency Department and after careful evaluation, we did not find any emergent condition requiring admission or further testing in the [facility].  Your exam/testing today was overall reassuring.  CT scans did not show any emergent conditions.  Suspect pain related to muscle strain or spasm.  Recommend Tylenol at home for pain.  Can also use the Robaxin muscle relaxer provided.  Please return to the Emergency Department if you experience any worsening of your condition.  Thank you for allowing [place] to be a part of your care.        Maudie Flakes, MD 02/11/21 [phone]

## 2021-02-12 ENCOUNTER — Ambulatory Visit (INDEPENDENT_AMBULATORY_CARE_PROVIDER_SITE_OTHER): Payer: Medicare Other | Admitting: Nurse Practitioner

## 2021-02-12 ENCOUNTER — Encounter: Payer: Self-pay | Admitting: Nurse Practitioner

## 2021-02-12 VITALS — BP 148/88 | HR 88 | Ht 70.0 in | Wt 212.0 lb

## 2021-02-12 DIAGNOSIS — K625 Hemorrhage of anus and rectum: Secondary | ICD-10-CM | POA: Diagnosis not present

## 2021-02-12 DIAGNOSIS — K5900 Constipation, unspecified: Secondary | ICD-10-CM

## 2021-02-12 DIAGNOSIS — N186 End stage renal disease: Secondary | ICD-10-CM | POA: Diagnosis not present

## 2021-02-12 DIAGNOSIS — Z86010 Personal history of colonic polyps: Secondary | ICD-10-CM

## 2021-02-12 DIAGNOSIS — Z992 Dependence on renal dialysis: Secondary | ICD-10-CM | POA: Diagnosis not present

## 2021-02-12 MED ORDER — PLENVU 140 G PO SOLR: ORAL | 0 refills | Status: DC

## 2021-02-12 NOTE — Progress Notes (Addendum)
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   He presents to our office today to schedule a colonoscopy. He has chronic constipation which is fairly well controlled on Amitiza 107mg po bid.  He passes balls of solid stool, sometimes with watery stool most days. He occasionally sees bright red blood on the toilet tissue. He underwent a colonoscopy 12/08/2017 and 5 tubular adenomatous polyps measuring 3 to 139mwere removed from the colon. A recall colonoscopy in 3 years was recommended. He takes Famotidine 2065mo bid. He denies having any GERD symptoms.   He presented to the ED on 02/11/2021 with nausea and left flank pain. A renal stone CT was unremarkable.  He received Dilaudid and Ativan and his flank pain which reduced his pain. He was prescribed Robaxin and he was discharged home. He denies having any further left flank pain at this time. His left flank pain was likely due to a 1.7cm left kidney mass seen on abdominal MRI 11/25/2020.  History of ESRD. He is on hemodialysis at home for 3 hours on Mon/Tues/Thurs and Fridays. He has a 1.7 cm left kidney mass identified per MRI,  concerning for primary renal malignancy. He is scheduled for a robotic partial or radical left nephrectomy next week. [place] [facility] [place] Units 02/11/2021 09/04/2020 09/03/2020  WBC 4.0 - 10.5 K/uL 8.8 6.0 6.1  Hemoglobin 13.0 - 17.0 g/dL 9.5(L) 9.2(L) 9.6(L)  Hematocrit 39.0 - 52.0 % 26.9(L) 26.5(L) 26.8(L)   Platelets 150 - 400 K/uL 206 217 228  MCV 99.6.   CMP Latest Ref Rng & Units 02/11/2021 09/28/2020 09/04/2020  Glucose 70 - 99 mg/dL 117(H) 98 103(H)  BUN 6 - 20 mg/dL 69(H) 49(H) 65(H)  Creatinine 0.61 - 1.24 mg/dL 11.28(H) 8.18(H) 9.74(H)  Sodium 135 - 145 mmol/L 137 138 139  Potassium 3.5 - 5.1 mmol/L 3.3(L) 4.2 3.3(L)  Chloride 98 - 111 mmol/L 95(L) 91(L) 98  CO2 22 - 32 mmol/L 25 28 23   Calcium 8.9 - 10.3 mg/dL 9.6 10.6(H) 9.5  Total Protein 6.5 - 8.1 g/dL 6.9 - -  Total Bilirubin 0.3 - 1.2 mg/dL 0.6 - -  Alkaline Phos 38 - 126 U/L 46 - -  AST 15 - 41 U/L 15 - -  ALT 0 - 44 U/L 14 - -   Chest/abd/pelvic CT angio 02/11/2021: 1. No evidence of acute aortic syndrome. No specific explanation for symptoms. 2.  Aortic Atherosclerosis (ICD10-I70.0).  Coronary atherosclerosis. 3. Cardiomegaly  Abdominal MRI 11/25/2020: 1. There are multiple bilateral fluid signal lesions of the kidneys, notably a 1.7 cm lesion of the posterior midportion of the left kidney and a 1.5 cm lesion of the superior pole of the right kidney. There are numerous additional subcentimeter fluid signal lesions, too small to characterize although likely additional tiny cysts. These lesions are not obviously changed compared to prior noncontrast CT examinations and there are no suspicious features by noncontrast MR to suggest need for additional follow-up or characterization. Please note that in general,  noncontrast MR is of limited value for abdominal organ lesion characterization and contrast is strongly preferred for this purpose. 2. Hepatic and splenic iron deposition, suggesting secondary hemochromatosis. Correlate for history of transfusion or other possible etiology.  GI Procedures: Colonoscopy 12/08/2017:  - The examined portion of the ileum was normal. - The distal rectum and anal verge are normal on retroflexion view. - Two 2 to 3 mm polyps in the cecum, removed with a cold biopsy forceps. Resected  and retrieved. - One 3 mm polyp in the ascending colon, removed with a cold biopsy forceps. Resected and retrieved. - One 10 mm polyp at the hepatic flexure, removed with a cold snare. Resected and retrieved. - One 4 mm polyp in the sigmoid colon, removed with a cold snare. Resected and retrieved Internal hemorrhoids were found during retroflexion. The hemorrhoids were small. -3 year recall Biopsy Result:  1. Surgical [P], ascending and cecum, polyp (3) - TUBULAR ADENOMA(S). - NEGATIVE FOR HIGH GRADE DYSPLASIA OR MALIGNANCY. 2. Surgical [P], hepatic flexure, polyp - TUBULAR ADENOMA(S). - NEGATIVE FOR HIGH GRADE DYSPLASIA OR MALIGNANCY. 3. Surgical [P], sigmoid, polyp - TUBULAR ADENOMA. - NEGATIVE FOR HIGH GRADE DYSPLASIA OR MALIGNANCY.  Capsule endoscopy 03/01/2018:  Distal esophagitis Mild duodenitis  Otherwise negative study  Findings did not explain probable ileal wall thickening noted on CT in 11/2017  Cardiac studies:   January 31, 2020 SPECT:  Nuclear stress EF: 51%. The left ventricular ejection fraction is mildly decreased (45-54%). The LV appears to be mildly dilated.  There was no ST segment deviation noted during stress.  This is a low risk study. There is no evidence of ischemia or previous infarction.  The study is normal.  ECHO TEE 09/03/2020: 1. Left ventricular ejection fraction, by estimation, is 50 to 55%. The left ventricle has normal function. The left ventricle has no regional wall motion abnormalities. 2. Right ventricular systolic function is normal. The right ventricular size is normal. 3. Left atrial size was mildly dilated.. 4. MV annulus measures 37 mm. The mitral leaflets appear normal but coaptation not complete There are multiple jets of MR, the most prominent is posteriorly directed, wrapping around posterior aspect of LA Appears moderate to moderately severe (Carpentier Type I). The mitral valve is normal in structure. Moderate to severe mitral  valve regurgitation. 5. The aortic valve is normal in structure. Aortic valve regurgitation is mild. 6. Agitated saline contrast bubble study was negative, with no evidence of any interatrial s  ECHO ECHO 08/31/2020: 1. Left ventricular ejection fraction, by estimation, is 60 to 65%. The left ventricle has normal function. The left ventricle has no regional wall motion abnormalities. The left ventricular internal cavity size was moderately dilated. There is mild left ventricular hypertrophy. 2. Right ventricular systolic function is normal. The right ventricular size is normal. There is moderately elevated pulmonary artery systolic pressure. 3. Left atrial size was severely dilated. 4. Right atrial size was mildly dilated. 5. MR jet is eccentrically directed along posterior wall of LA Would recomm TEE to further define mechanism. COmpared to echo from APril 2021, MR appears more severe.. The mitral valve is grossly normal. Severe mitral valve regurgitation. 6. The aortic valve is normal in structure. Aortic valve regurgitation is trivial. 7. Aortic dilatation noted. There is mild dilatation of the aortic root, measuring 40 mm. 8. The inferior vena cava is normal in size with greater than 50% respiratory variability, suggesting right atrial pressure of 3 mmHg  Past Medical History:  Diagnosis Date  .  Anemia   . Aortic atherosclerosis ([place])   . Asthma   . Chronic diastolic CHF (congestive heart failure) ([place]) 01/06/2017   Echo 7/16 [facility] in [place], [name]) Mild AI, mild LAE, mild concentric LVH, EF 55, normal wall motion, mild to moderate MR, mild PI, RVSP 55 // Echo 10/09/16 ([facility]):  Moderate LVH, grade 2 diastolic dysfunction, mild MR, moderate LAE   . CKD (chronic kidney disease)    per pt, waiting on a kidney transplant in near future.  . Esophagitis   . Gout   . Hepatitis    Hep B  . History of nuclear stress test    a. Nuc study 7/16: no scar or ischemia, EF 42 // b. Nuc  study 12/17: EF 48, ?small apical ischemia, Low Risk  . HIV (human immunodeficiency virus infection) ([place])   . HLD (hyperlipidemia)   . Hypertension   . Hypertensive heart disease with CHF (congestive heart failure) ([place]) 10/09/2016  . Mitral regurgitation   . OSA (obstructive sleep apnea)   . Post-operative nausea and vomiting    1 time as a child  . Sleep apnea    uses c-pap  . Wears glasses   . Wears partial dentures    Current Outpatient Medications on File Prior to Visit  Medication Sig Dispense Refill  . albuterol (VENTOLIN HFA) 108 (90 Base) MCG/ACT inhaler INHALE 2 PUFFS INTO THE LUNGS EVERY 4 HOURS AS NEEDED FOR WHEEZING OR SHORTNESS OF BREATH (Patient taking differently: Inhale 2 puffs into the lungs every 4 (four) hours as needed for wheezing or shortness of breath.) 8.5 g 1  . allopurinol (ZYLOPRIM) 100 MG tablet TAKE 1 TABLET(100 MG) BY MOUTH TWICE DAILY (Patient taking differently: Take 100 mg by mouth 2 (two) times daily.) 60 tablet 3  . amLODipine (NORVASC) 10 MG tablet Take 1 tablet (10 mg total) by mouth daily. 30 tablet 1  . azelastine (ASTELIN) 0.1 % nasal spray Place 2 sprays into both nostrils 2 (two) times daily as needed for rhinitis. Use in each nostril as directed 30 mL 5  . bictegravir-emtricitabine-tenofovir AF (BIKTARVY) 50-200-25 MG TABS tablet Take 1 tablet by mouth daily. 30 tablet 6  . carvedilol (COREG) 25 MG tablet TAKE 1 TABLET BY MOUTH TWICE DAILY WITH FOOD (Patient taking differently: Take 25 mg by mouth 2 (two) times daily with a meal.) 60 tablet 2  . clonazePAM (KLONOPIN) 0.5 MG tablet Take 1 tablet (0.5 mg total) by mouth at bedtime. 30 tablet 2  . doxycycline (VIBRA-TABS) 100 MG tablet Take 1 tablet (100 mg total) by mouth 2 (two) times daily. 14 tablet 0  . famotidine (PEPCID) 20 MG tablet TAKE 1 TABLET(20 MG) BY MOUTH TWICE DAILY (Patient taking differently: Take 20 mg by mouth 2 (two) times daily.) 180 tablet 3  . FLOVENT HFA 110 MCG/ACT inhaler  INHALE 2 PUFFS INTO THE LUNGS TWICE DAILY (Patient taking differently: Inhale 2 puffs into the lungs 2 (two) times daily as needed (wheezing).) 12 g 2  . gabapentin (NEURONTIN) 300 MG capsule TAKE 1 CAPSULE(300 MG) BY MOUTH THREE TIMES DAILY (Patient taking differently: Take 300 mg by mouth 3 (three) times daily.) 90 capsule 3  . hydrALAZINE (APRESOLINE) 100 MG tablet Take 1 tablet (100 mg total) by mouth 3 (three) times daily. 270 tablet 0  . hyoscyamine (LEVSIN SL) 0.125 MG SL tablet Place 1-2 tablets (0.125-0.25 mg total) under the tongue every 8 (eight) hours as needed (lower abdominal pain, spasm). 90 tablet 2  .  ipratropium (ATROVENT) 0.06 % nasal spray USE 2 SPRAYS IN EACH NOSTRIL 2 TO 3 TIMES DAILY AS NEEDED (Patient taking differently: Place 2 sprays into both nostrils 3 (three) times daily as needed for rhinitis.) 15 mL 1  . lactulose (CHRONULAC) 10 GM/15ML solution Take 20 g by mouth daily as needed for moderate constipation.     Marland Kitchen levothyroxine (SYNTHROID) 25 MCG tablet TAKE 1 TABLET BY MOUTH DAILY BEFORE BREAKFAST (Patient taking differently: Take 25 mcg by mouth daily before breakfast.) 90 tablet 3  . lidocaine-prilocaine (EMLA) cream Apply 1 application topically See admin instructions. Apply to access site 1-2 hours before dialysis    . lubiprostone (AMITIZA) 24 MCG capsule Take 1 capsule (24 mcg total) by mouth 2 (two) times daily with a meal. NEEDS VISIT FOR FURTHER REFILLS 60 capsule 0  . methocarbamol (ROBAXIN) 500 MG tablet Take 1 tablet (500 mg total) by mouth every 8 (eight) hours as needed for muscle spasms. 30 tablet 0  . Methoxy PEG-Epoetin Beta (MIRCERA IJ) Inject into the skin.    . mupirocin ointment (BACTROBAN) 2 % Apply 1 application topically daily as needed (apply to port after dialysis).     . Olopatadine HCl (PAZEO) 0.7 % SOLN Place 1 drop into both eyes daily. (Patient taking differently: Place 1 drop into both eyes daily as needed (itchy eyes).) 2.5 mL 5  .  PRESCRIPTION MEDICATION Take 1 tablet by mouth daily. Multi vitamin from dialysis    . rOPINIRole (REQUIP) 0.5 MG tablet Take 0.5 mg by mouth at bedtime.    . sevelamer carbonate (RENVELA) 800 MG tablet Take 1,600 mg by mouth 3 (three) times daily.    Marland Kitchen Spacer/Aero-Holding Chambers DEVI 1 Device by Does not apply route 2 (two) times a day. 1 each 1  . Vitamin D, Ergocalciferol, (DRISDOL) 1.25 MG (50000 UNIT) CAPS capsule TAKE 1 CAPSULE BY MOUTH EVERY 7 DAYS (Patient taking differently: Take 50,000 Units by mouth every Tuesday.) 12 capsule 1  . calcitRIOL (ROCALTROL) 0.5 MCG capsule Take 0.5 mcg by mouth 3 (three) times a week. (Patient not taking: Reported on 02/12/2021)     No current facility-administered medications on file prior to visit.    Allergies  Allergen Reactions  . Ace Inhibitors Cough and Other (See Comments)  . Lisinopril Cough   Current Medications, Allergies, Past Medical History, Past Surgical History, Family History and Social History were reviewed in [employer] record.   Review of Systems:   Constitutional: Negative for fever, sweats, chills or weight loss.  Respiratory: Negative for shortness of breath.   Cardiovascular: Negative for chest pain, palpitations and leg swelling.  Gastrointestinal: See HPI.  Musculoskeletal: Negative for back pain or muscle aches.  Neurological: Negative for dizziness, headaches or paresthesias.   Physical Exam: BP (!) 148/88   Pulse 88   Ht 5' 10"  (1.778 m)   Wt 212 lb (96.2 kg)   SpO2 96%   BMI 30.42 kg/m  General: Well developed 55 year old male in no acute distress. Head: Normocephalic and atraumatic. Eyes: No scleral icterus. Conjunctiva pink . Ears: Normal auditory acuity. Mouth: Dentition intact. No ulcers or lesions.  Lungs: Clear throughout to auscultation. Heart: Regular rate and rhythm, no murmur. Abdomen: Soft, nontender and nondistended. No masses or hepatomegaly. Normal bowel sounds x 4  quadrants.  Rectal: Deferred.  Musculoskeletal: Symmetrical with no gross deformities. Extremities: No edema. LUE fistula with + bruit and thrill, bandage intact.  Neurological: Alert oriented x 4.  No focal deficits.  Psychological: Alert and cooperative. Normal mood and affect  Assessment and Recommendations:  49. 55 year old male with a history of tubular adenomatous colon polyps. Occasional rectal bleeding.  -Colonoscopy to be scheduled 4 to 6 weeks after left nephrectomy surgery if no post operative complications develop.  Colonoscopy benefits and risks discussed including risk with sedation, risk of bleeding, perforation and infection  -Await outcome of renal surgery   2. Chronic constipation on Linzess   3. GERD, controlled on Famotidine 10m bid   4. ESRD on HD, awaiting kidney transplant. He has a 1.7 cm left renal mass scheduled for a partial nephrectomy next week  5. Normocytic anemia secondary to ESRD   6. HIV + on BIKTAVY followed by ID Dr. HJohnnye Sima  7. History of chronic hepatitis B (e antigen negative).  Normal LFTs. Hepatitis B (+ Hep B surface antigen 09/2017, + Hep B surface antigen and + Hep B core total ab 08/08/2020 with undetectable Hep B DNA level 12/05/2020). Previously on Lamivudine, now on  BIKTAVY.  Abdominal MRI 11/25/2020 showed iron deposition (likely due to IV iron infusions) without evidence of any liver lesions/hepatoma.  Addendum: [facility] hepatologist [facility] Refer to hepatology consult 09/11/2020 (filed under Media in [place].

## 2021-02-12 NOTE — Patient Instructions (Signed)
You have been scheduled for a colonoscopy. Please follow written instructions given to you at your visit today.  Please pick up your prep supplies at the pharmacy within the next 1-3 days. If you use inhalers (even only as needed), please bring them with you on the day of your procedure.   Due to recent changes in healthcare laws, you may see the results of your imaging and laboratory studies on MyChart before your provider has had a chance to review them.  We understand that in some cases there may be results that are confusing or concerning to you. Not all laboratory results come back in the same time frame and the provider may be waiting for multiple results in order to interpret others.  Please give [place] 48 hours in order for your provider to thoroughly review all the results before contacting the office for clarification of your results.   If you are age 29 or older, your body mass index should be between 23-30. Your Body mass index is 30.42 kg/m. If this is out of the aforementioned range listed, please consider follow up with your Primary Care Provider.  If you are age 70 or younger, your body mass index should be between 19-25. Your Body mass index is 30.42 kg/m. If this is out of the aformentioned range listed, please consider follow up with your Primary Care Provider.    I appreciate the  opportunity to care for you  Thank You   Marcella Dubs

## 2021-02-13 DIAGNOSIS — I10 Essential (primary) hypertension: Secondary | ICD-10-CM | POA: Diagnosis not present

## 2021-02-13 DIAGNOSIS — G4733 Obstructive sleep apnea (adult) (pediatric): Secondary | ICD-10-CM | POA: Diagnosis not present

## 2021-02-18 ENCOUNTER — Other Ambulatory Visit: Payer: Self-pay | Admitting: Allergy & Immunology

## 2021-02-18 DIAGNOSIS — M109 Gout, unspecified: Secondary | ICD-10-CM | POA: Diagnosis not present

## 2021-02-18 DIAGNOSIS — I252 Old myocardial infarction: Secondary | ICD-10-CM | POA: Diagnosis not present

## 2021-02-18 DIAGNOSIS — N186 End stage renal disease: Secondary | ICD-10-CM | POA: Diagnosis not present

## 2021-02-18 DIAGNOSIS — I132 Hypertensive heart and chronic kidney disease with heart failure and with stage 5 chronic kidney disease, or end stage renal disease: Secondary | ICD-10-CM | POA: Diagnosis not present

## 2021-02-18 DIAGNOSIS — N2889 Other specified disorders of kidney and ureter: Secondary | ICD-10-CM | POA: Insufficient documentation

## 2021-02-18 DIAGNOSIS — E039 Hypothyroidism, unspecified: Secondary | ICD-10-CM | POA: Diagnosis not present

## 2021-02-18 DIAGNOSIS — E785 Hyperlipidemia, unspecified: Secondary | ICD-10-CM | POA: Diagnosis not present

## 2021-02-18 DIAGNOSIS — J45909 Unspecified asthma, uncomplicated: Secondary | ICD-10-CM | POA: Diagnosis not present

## 2021-02-18 DIAGNOSIS — D649 Anemia, unspecified: Secondary | ICD-10-CM | POA: Diagnosis not present

## 2021-02-18 DIAGNOSIS — G4733 Obstructive sleep apnea (adult) (pediatric): Secondary | ICD-10-CM | POA: Diagnosis not present

## 2021-02-18 DIAGNOSIS — C642 Malignant neoplasm of left kidney, except renal pelvis: Secondary | ICD-10-CM | POA: Diagnosis not present

## 2021-02-18 DIAGNOSIS — K219 Gastro-esophageal reflux disease without esophagitis: Secondary | ICD-10-CM | POA: Diagnosis not present

## 2021-02-18 DIAGNOSIS — Z79899 Other long term (current) drug therapy: Secondary | ICD-10-CM | POA: Diagnosis not present

## 2021-02-18 DIAGNOSIS — I5032 Chronic diastolic (congestive) heart failure: Secondary | ICD-10-CM | POA: Diagnosis not present

## 2021-02-18 DIAGNOSIS — Z87891 Personal history of nicotine dependence: Secondary | ICD-10-CM | POA: Diagnosis not present

## 2021-02-18 HISTORY — PX: NEPHRECTOMY: SHX65

## 2021-02-18 NOTE — Progress Notes (Signed)
Addendum: Reviewed and agree with assessment and management plan. Doloris Servantes M, MD  

## 2021-02-19 DIAGNOSIS — N186 End stage renal disease: Secondary | ICD-10-CM | POA: Diagnosis not present

## 2021-02-20 ENCOUNTER — Other Ambulatory Visit: Payer: Self-pay | Admitting: Infectious Diseases

## 2021-02-20 DIAGNOSIS — C642 Malignant neoplasm of left kidney, except renal pelvis: Secondary | ICD-10-CM | POA: Diagnosis not present

## 2021-02-21 DIAGNOSIS — N2581 Secondary hyperparathyroidism of renal origin: Secondary | ICD-10-CM | POA: Diagnosis not present

## 2021-02-21 DIAGNOSIS — Z79899 Other long term (current) drug therapy: Secondary | ICD-10-CM | POA: Diagnosis not present

## 2021-02-21 DIAGNOSIS — Z992 Dependence on renal dialysis: Secondary | ICD-10-CM | POA: Diagnosis not present

## 2021-02-21 DIAGNOSIS — E876 Hypokalemia: Secondary | ICD-10-CM | POA: Diagnosis not present

## 2021-02-21 DIAGNOSIS — N2589 Other disorders resulting from impaired renal tubular function: Secondary | ICD-10-CM | POA: Diagnosis not present

## 2021-02-21 DIAGNOSIS — D509 Iron deficiency anemia, unspecified: Secondary | ICD-10-CM | POA: Diagnosis not present

## 2021-02-21 DIAGNOSIS — D631 Anemia in chronic kidney disease: Secondary | ICD-10-CM | POA: Diagnosis not present

## 2021-02-21 DIAGNOSIS — R82998 Other abnormal findings in urine: Secondary | ICD-10-CM | POA: Diagnosis not present

## 2021-02-21 DIAGNOSIS — E8770 Fluid overload, unspecified: Secondary | ICD-10-CM | POA: Diagnosis not present

## 2021-02-21 DIAGNOSIS — E440 Moderate protein-calorie malnutrition: Secondary | ICD-10-CM | POA: Diagnosis not present

## 2021-02-21 DIAGNOSIS — Z4931 Encounter for adequacy testing for hemodialysis: Secondary | ICD-10-CM | POA: Diagnosis not present

## 2021-02-21 DIAGNOSIS — N186 End stage renal disease: Secondary | ICD-10-CM | POA: Diagnosis not present

## 2021-02-22 DIAGNOSIS — E440 Moderate protein-calorie malnutrition: Secondary | ICD-10-CM | POA: Diagnosis not present

## 2021-02-22 DIAGNOSIS — D509 Iron deficiency anemia, unspecified: Secondary | ICD-10-CM | POA: Diagnosis not present

## 2021-02-22 DIAGNOSIS — N2589 Other disorders resulting from impaired renal tubular function: Secondary | ICD-10-CM | POA: Diagnosis not present

## 2021-02-22 DIAGNOSIS — N186 End stage renal disease: Secondary | ICD-10-CM | POA: Diagnosis not present

## 2021-02-22 DIAGNOSIS — R82998 Other abnormal findings in urine: Secondary | ICD-10-CM | POA: Diagnosis not present

## 2021-02-22 DIAGNOSIS — D631 Anemia in chronic kidney disease: Secondary | ICD-10-CM | POA: Diagnosis not present

## 2021-02-22 DIAGNOSIS — E8770 Fluid overload, unspecified: Secondary | ICD-10-CM | POA: Diagnosis not present

## 2021-02-22 DIAGNOSIS — Z992 Dependence on renal dialysis: Secondary | ICD-10-CM | POA: Diagnosis not present

## 2021-02-22 DIAGNOSIS — Z4931 Encounter for adequacy testing for hemodialysis: Secondary | ICD-10-CM | POA: Diagnosis not present

## 2021-02-22 DIAGNOSIS — N2581 Secondary hyperparathyroidism of renal origin: Secondary | ICD-10-CM | POA: Diagnosis not present

## 2021-02-22 DIAGNOSIS — Z79899 Other long term (current) drug therapy: Secondary | ICD-10-CM | POA: Diagnosis not present

## 2021-02-22 DIAGNOSIS — E876 Hypokalemia: Secondary | ICD-10-CM | POA: Diagnosis not present

## 2021-02-23 DIAGNOSIS — N186 End stage renal disease: Secondary | ICD-10-CM | POA: Diagnosis not present

## 2021-02-23 DIAGNOSIS — Z992 Dependence on renal dialysis: Secondary | ICD-10-CM | POA: Diagnosis not present

## 2021-02-23 DIAGNOSIS — I129 Hypertensive chronic kidney disease with stage 1 through stage 4 chronic kidney disease, or unspecified chronic kidney disease: Secondary | ICD-10-CM | POA: Diagnosis not present

## 2021-02-25 DIAGNOSIS — E876 Hypokalemia: Secondary | ICD-10-CM | POA: Diagnosis not present

## 2021-02-25 DIAGNOSIS — E440 Moderate protein-calorie malnutrition: Secondary | ICD-10-CM | POA: Diagnosis not present

## 2021-02-25 DIAGNOSIS — E8770 Fluid overload, unspecified: Secondary | ICD-10-CM | POA: Diagnosis not present

## 2021-02-25 DIAGNOSIS — D509 Iron deficiency anemia, unspecified: Secondary | ICD-10-CM | POA: Diagnosis not present

## 2021-02-25 DIAGNOSIS — N2581 Secondary hyperparathyroidism of renal origin: Secondary | ICD-10-CM | POA: Diagnosis not present

## 2021-02-25 DIAGNOSIS — R82998 Other abnormal findings in urine: Secondary | ICD-10-CM | POA: Diagnosis not present

## 2021-02-25 DIAGNOSIS — D631 Anemia in chronic kidney disease: Secondary | ICD-10-CM | POA: Diagnosis not present

## 2021-02-25 DIAGNOSIS — Z992 Dependence on renal dialysis: Secondary | ICD-10-CM | POA: Diagnosis not present

## 2021-02-25 DIAGNOSIS — N186 End stage renal disease: Secondary | ICD-10-CM | POA: Diagnosis not present

## 2021-02-25 DIAGNOSIS — Z79899 Other long term (current) drug therapy: Secondary | ICD-10-CM | POA: Diagnosis not present

## 2021-02-25 DIAGNOSIS — K7689 Other specified diseases of liver: Secondary | ICD-10-CM | POA: Diagnosis not present

## 2021-02-25 DIAGNOSIS — Z4931 Encounter for adequacy testing for hemodialysis: Secondary | ICD-10-CM | POA: Diagnosis not present

## 2021-02-26 ENCOUNTER — Telehealth (INDEPENDENT_AMBULATORY_CARE_PROVIDER_SITE_OTHER): Payer: Medicare Other | Admitting: Family Medicine

## 2021-02-26 VITALS — Ht 70.0 in | Wt 210.0 lb

## 2021-02-26 DIAGNOSIS — N2581 Secondary hyperparathyroidism of renal origin: Secondary | ICD-10-CM | POA: Diagnosis not present

## 2021-02-26 DIAGNOSIS — E876 Hypokalemia: Secondary | ICD-10-CM | POA: Diagnosis not present

## 2021-02-26 DIAGNOSIS — K7689 Other specified diseases of liver: Secondary | ICD-10-CM | POA: Diagnosis not present

## 2021-02-26 DIAGNOSIS — R066 Hiccough: Secondary | ICD-10-CM | POA: Diagnosis not present

## 2021-02-26 DIAGNOSIS — E8770 Fluid overload, unspecified: Secondary | ICD-10-CM | POA: Diagnosis not present

## 2021-02-26 DIAGNOSIS — Z992 Dependence on renal dialysis: Secondary | ICD-10-CM | POA: Diagnosis not present

## 2021-02-26 DIAGNOSIS — R82998 Other abnormal findings in urine: Secondary | ICD-10-CM | POA: Diagnosis not present

## 2021-02-26 DIAGNOSIS — D631 Anemia in chronic kidney disease: Secondary | ICD-10-CM | POA: Diagnosis not present

## 2021-02-26 DIAGNOSIS — E440 Moderate protein-calorie malnutrition: Secondary | ICD-10-CM | POA: Diagnosis not present

## 2021-02-26 DIAGNOSIS — D509 Iron deficiency anemia, unspecified: Secondary | ICD-10-CM | POA: Diagnosis not present

## 2021-02-26 DIAGNOSIS — Z79899 Other long term (current) drug therapy: Secondary | ICD-10-CM | POA: Diagnosis not present

## 2021-02-26 DIAGNOSIS — N186 End stage renal disease: Secondary | ICD-10-CM | POA: Diagnosis not present

## 2021-02-26 DIAGNOSIS — Z4931 Encounter for adequacy testing for hemodialysis: Secondary | ICD-10-CM | POA: Diagnosis not present

## 2021-02-26 MED ORDER — CHLORPROMAZINE HCL 10 MG PO TABS: 10.0000 mg | ORAL_TABLET | Freq: Three times a day (TID) | ORAL | 0 refills | Status: DC | PRN

## 2021-02-26 NOTE — Patient Instructions (Signed)
Hiccups  A hiccup is the result of a sudden irritation of a muscle that is used for breathing (diaphragm). The diaphragm is located under your lungs and above your stomach. When the diaphragm gets irritated, it may quickly tighten without your control (have a spasm). The spasm causes you to quickly suck in air, and that causes your vocal cords to close together quickly. These reactions cause the hiccup sound. Hiccups usually last only a short amount of time (less than 48 hours). In unusual cases, they can last for days or months and require you to see your health care provider. Common causes of hiccups include:  Eating too fast or eating too much food.  Drinking alcohol or bubbly (carbonated) drinks.  Eating or drinking hot or spicy foods and drinks.  Swallowing extra air when sucking on candy or a straw or when chewing on gum.  Feeling nervous, stressed, or excited.  Having certain conditions that irritate the diaphragm nerves.  Having metabolic or nervous system disorders. Follow these instructions at home: To prevent hiccups or lessen discomfort from hiccups:  Eat and chew your food slowly.  Eat small meals, and avoid overeating.  If you drink alcohol: ? Limit how much you have to:  0-1 drink a day for women who are not pregnant.  0-2 drinks a day for men. ? Know how much alcohol is in a drink. In the [place] one drink equals one 12 oz bottle of beer (355 mL), one 5 oz glass of wine (148 mL), or one 1 oz glass of hard liquor (44 mL).  Limit your drinking of carbonated or fizzy drinks, such as soda.  Avoid eating or drinking hot or spicy foods and drinks. General instructions  Watch for any changes in your hiccups.  Take over-the-counter and prescription medicines only as told by your health care provider. Contact a health care provider if:  Your hiccups last for more than 48 hours.  Your hiccups do not improve with treatment.  You cannot sleep or eat because of your  hiccups.  You have unexpected weight loss because of your hiccups.  You have numbness, tingling, or weakness. Get help right away if:  You have trouble breathing or swallowing.  You have severe pain in your abdomen. These symptoms may represent a serious problem that is an emergency. Do not wait to see if the symptoms will go away. Get medical help right away. Call your local emergency services (911 in the [place] Do not drive yourself to the hospital. Summary  A hiccup is the result of a sudden irritation of a muscle that is used for breathing (diaphragm).  Hiccups can be caused by many things, including eating too fast.  Call your health care provider if your hiccups last for more than 48 hours. This information is not intended to replace advice given to you by your health care provider. Make sure you discuss any questions you have with your health care provider. Document Revised: 06/15/2020 Document Reviewed: 06/15/2020 Elsevier Patient Education  [place].

## 2021-02-26 NOTE — Progress Notes (Signed)
[name], Albert Eaton are scheduled for a virtual visit with your provider today.    Just as we do with appointments in the office, we must obtain your consent to participate.  Your consent will be active for this visit and any virtual visit you may have with one of our providers in the next 365 days.    If you have a MyChart account, I can also send a copy of this consent to you electronically.  All virtual visits are billed to your insurance company just like a traditional visit in the office.  As this is a virtual visit, video technology does not allow for your provider to perform a traditional examination.  This may limit your provider's ability to fully assess your condition.  If your provider identifies any concerns that need to be evaluated in person or the need to arrange testing such as labs, EKG, etc, we will make arrangements to do so.    Although advances in technology are sophisticated, we cannot ensure that it will always work on either your end or our end.  If the connection with a video visit is poor, we may have to switch to a telephone visit.  With either a video or telephone visit, we are not always able to ensure that we have a secure connection.   I need to obtain your verbal consent now.   Are you willing to proceed with your visit today?   Albert Eaton has provided verbal consent on 02/26/2021 for a virtual visit (video or telephone).    Virtual Visit via Video Note  I connected with Albert Eaton on 02/26/21 at  1:15 PM EDT by a video enabled telemedicine application and verified that I am speaking with the correct person using two identifiers.  Location: Patient: Home Provider: [place]   I discussed the limitations of evaluation and management by telemedicine and the availability of in person appointments. The patient expressed understanding and agreed to proceed.  History of Present Illness: Albert Eaton is a 55 year old male with a medical history  significant for end-stage renal disease on dialysis, Crohn's disease of small intestines, hyperparathyroidism of renal origin, HIV disease, gout, and hyperlipidemia that presents via video with a primary complaint of hiccups over the past several weeks.  Patient recently underwent surgery on 02/18/2021 to remove a malignant neoplasm of the left kidney.  Surgery was performed without complication.  However, patient says that he has been experiencing hiccups since that time.  He says that he is attempted to hold his breath, has taking GERD medications consistently, and other interventions to alleviate this problem without success.  Mr. Ojala has followed up with surgeon as scheduled.  He continues to have some increased abdominal bloating and abdominal discomfort following surgery.   Past Medical History:  Diagnosis Date  . Anemia   . Aortic atherosclerosis ([place])   . Asthma   . Chronic diastolic CHF (congestive heart failure) ([place]) 01/06/2017   Echo 7/16 [facility] in [place], [name]) Mild AI, mild LAE, mild concentric LVH, EF 55, normal wall motion, mild to moderate MR, mild PI, RVSP 55 // Echo 10/09/16 ([facility]):  Moderate LVH, grade 2 diastolic dysfunction, mild MR, moderate LAE   . CKD (chronic kidney disease)    per pt, waiting on a kidney transplant in near future.  . Esophagitis   . Gout   . Hepatitis    Hep B  . History of nuclear stress test    a.  Nuc study 7/16: no scar or ischemia, EF 42 // b. Nuc study 12/17: EF 48, ?small apical ischemia, Low Risk  . HIV (human immunodeficiency virus infection) ([place])   . HLD (hyperlipidemia)   . Hypertension   . Hypertensive heart disease with CHF (congestive heart failure) ([place]) 10/09/2016  . Mitral regurgitation   . OSA (obstructive sleep apnea)   . Post-operative nausea and vomiting    1 time as a child  . Sleep apnea    uses c-pap  . Wears glasses   . Wears partial dentures    Past Surgical History:  Procedure Laterality Date  . AV  FISTULA PLACEMENT Left 07/02/2018   Procedure: Creation of Left arm BRACHIOBASILIC ARTERIOVENOUS  FISTULA;  Surgeon: Marty Heck, MD;  Location: [place];  Service: Vascular;  Laterality: Left;  . BASCILIC VEIN TRANSPOSITION Left 08/27/2018   Procedure: Left arm BRACHIOBASILIC VEIN TRANSPOSITION SECOND STAGE;  Surgeon: Marty Heck, MD;  Location: [place];  Service: Vascular;  Laterality: Left;  . BUBBLE STUDY  09/03/2020   Procedure: BUBBLE STUDY;  Surgeon: Fay Records, MD;  Location: [place];  Service: Cardiovascular;;  . CHOLECYSTECTOMY    . COLONOSCOPY W/ BIOPSIES AND POLYPECTOMY    . GIVENS CAPSULE STUDY N/A 03/01/2018   Procedure: GIVENS CAPSULE STUDY;  Surgeon: Jerene Bears, MD;  Location: [place];  Service: Gastroenterology;  Laterality: N/A;  . HERNIA REPAIR     As baby  . MULTIPLE TOOTH EXTRACTIONS    . NOSE SURGERY    . RENAL BIOPSY    . TEE WITHOUT CARDIOVERSION N/A 09/03/2020   Procedure: TRANSESOPHAGEAL ECHOCARDIOGRAM (TEE);  Surgeon: Fay Records, MD;  Location: [facility];  Service: Cardiovascular;  Laterality: N/A;   Immunization History  Administered Date(s) Administered  . Influenza,inj,Quad PF,6+ Mos 10/10/2016, 08/31/2017, 07/08/2018, 06/23/2019, 07/11/2020  . Meningococcal Conjugate 10/15/2016, 08/31/2017  . Meningococcal Mcv4o 10/15/2016, 08/31/2017  . PFIZER(Purple Top)SARS-COV-2 Vaccination 01/06/2020, 02/01/2020  . Pneumococcal Conjugate-13 12/29/2018  . Pneumococcal Polysaccharide-23 10/15/2016, 10/13/2019  . Tdap 01/27/2017    Assessment and Plan:   Follow Up Instructions:    I discussed the assessment and treatment plan with the patient. The patient was provided an opportunity to ask questions and all were answered. The patient agreed with the plan and demonstrated an understanding of the instructions.   The patient was advised to call back or seek an in-person evaluation if the symptoms worsen or if the condition fails to improve  as anticipated.  I provided 10 minutes of non-face-to-face time during this encounter.   [name]  APRN, MSN, FNP-C Patient [facility] [address] [place], [place] [address] [phone]

## 2021-02-27 ENCOUNTER — Other Ambulatory Visit: Payer: Self-pay | Admitting: Internal Medicine

## 2021-02-28 DIAGNOSIS — E8770 Fluid overload, unspecified: Secondary | ICD-10-CM | POA: Diagnosis not present

## 2021-02-28 DIAGNOSIS — E440 Moderate protein-calorie malnutrition: Secondary | ICD-10-CM | POA: Diagnosis not present

## 2021-02-28 DIAGNOSIS — D509 Iron deficiency anemia, unspecified: Secondary | ICD-10-CM | POA: Diagnosis not present

## 2021-02-28 DIAGNOSIS — K7689 Other specified diseases of liver: Secondary | ICD-10-CM | POA: Diagnosis not present

## 2021-02-28 DIAGNOSIS — Z79899 Other long term (current) drug therapy: Secondary | ICD-10-CM | POA: Diagnosis not present

## 2021-02-28 DIAGNOSIS — N2581 Secondary hyperparathyroidism of renal origin: Secondary | ICD-10-CM | POA: Diagnosis not present

## 2021-02-28 DIAGNOSIS — N186 End stage renal disease: Secondary | ICD-10-CM | POA: Diagnosis not present

## 2021-02-28 DIAGNOSIS — E876 Hypokalemia: Secondary | ICD-10-CM | POA: Diagnosis not present

## 2021-02-28 DIAGNOSIS — D631 Anemia in chronic kidney disease: Secondary | ICD-10-CM | POA: Diagnosis not present

## 2021-02-28 DIAGNOSIS — Z992 Dependence on renal dialysis: Secondary | ICD-10-CM | POA: Diagnosis not present

## 2021-02-28 DIAGNOSIS — R82998 Other abnormal findings in urine: Secondary | ICD-10-CM | POA: Diagnosis not present

## 2021-02-28 DIAGNOSIS — Z4931 Encounter for adequacy testing for hemodialysis: Secondary | ICD-10-CM | POA: Diagnosis not present

## 2021-03-01 DIAGNOSIS — R82998 Other abnormal findings in urine: Secondary | ICD-10-CM | POA: Diagnosis not present

## 2021-03-01 DIAGNOSIS — Z992 Dependence on renal dialysis: Secondary | ICD-10-CM | POA: Diagnosis not present

## 2021-03-01 DIAGNOSIS — D509 Iron deficiency anemia, unspecified: Secondary | ICD-10-CM | POA: Diagnosis not present

## 2021-03-01 DIAGNOSIS — Z79899 Other long term (current) drug therapy: Secondary | ICD-10-CM | POA: Diagnosis not present

## 2021-03-01 DIAGNOSIS — E876 Hypokalemia: Secondary | ICD-10-CM | POA: Diagnosis not present

## 2021-03-01 DIAGNOSIS — D631 Anemia in chronic kidney disease: Secondary | ICD-10-CM | POA: Diagnosis not present

## 2021-03-01 DIAGNOSIS — N2581 Secondary hyperparathyroidism of renal origin: Secondary | ICD-10-CM | POA: Diagnosis not present

## 2021-03-01 DIAGNOSIS — Z4931 Encounter for adequacy testing for hemodialysis: Secondary | ICD-10-CM | POA: Diagnosis not present

## 2021-03-01 DIAGNOSIS — E8770 Fluid overload, unspecified: Secondary | ICD-10-CM | POA: Diagnosis not present

## 2021-03-01 DIAGNOSIS — N186 End stage renal disease: Secondary | ICD-10-CM | POA: Diagnosis not present

## 2021-03-01 DIAGNOSIS — K7689 Other specified diseases of liver: Secondary | ICD-10-CM | POA: Diagnosis not present

## 2021-03-01 DIAGNOSIS — E440 Moderate protein-calorie malnutrition: Secondary | ICD-10-CM | POA: Diagnosis not present

## 2021-03-04 ENCOUNTER — Other Ambulatory Visit: Payer: Self-pay

## 2021-03-04 ENCOUNTER — Ambulatory Visit (HOSPITAL_COMMUNITY): Payer: Medicare Other | Attending: Cardiology

## 2021-03-04 DIAGNOSIS — R9431 Abnormal electrocardiogram [ECG] [EKG]: Secondary | ICD-10-CM

## 2021-03-04 DIAGNOSIS — I340 Nonrheumatic mitral (valve) insufficiency: Secondary | ICD-10-CM

## 2021-03-04 DIAGNOSIS — I5032 Chronic diastolic (congestive) heart failure: Secondary | ICD-10-CM

## 2021-03-04 LAB — ECHOCARDIOGRAM COMPLETE
Area-P 1/2: 3.68 cm2
MV M vel: 5.64 m/s
MV Peak grad: 127.2 mmHg
Radius: 1 cm
S' Lateral: 3.65 cm

## 2021-03-05 ENCOUNTER — Other Ambulatory Visit: Payer: Self-pay | Admitting: Allergy & Immunology

## 2021-03-05 DIAGNOSIS — K7689 Other specified diseases of liver: Secondary | ICD-10-CM | POA: Diagnosis not present

## 2021-03-05 DIAGNOSIS — E8770 Fluid overload, unspecified: Secondary | ICD-10-CM | POA: Diagnosis not present

## 2021-03-05 DIAGNOSIS — D631 Anemia in chronic kidney disease: Secondary | ICD-10-CM | POA: Diagnosis not present

## 2021-03-05 DIAGNOSIS — D509 Iron deficiency anemia, unspecified: Secondary | ICD-10-CM | POA: Diagnosis not present

## 2021-03-05 DIAGNOSIS — R82998 Other abnormal findings in urine: Secondary | ICD-10-CM | POA: Diagnosis not present

## 2021-03-05 DIAGNOSIS — Z992 Dependence on renal dialysis: Secondary | ICD-10-CM | POA: Diagnosis not present

## 2021-03-05 DIAGNOSIS — Z79899 Other long term (current) drug therapy: Secondary | ICD-10-CM | POA: Diagnosis not present

## 2021-03-05 DIAGNOSIS — N2581 Secondary hyperparathyroidism of renal origin: Secondary | ICD-10-CM | POA: Diagnosis not present

## 2021-03-05 DIAGNOSIS — N186 End stage renal disease: Secondary | ICD-10-CM | POA: Diagnosis not present

## 2021-03-05 DIAGNOSIS — Z4931 Encounter for adequacy testing for hemodialysis: Secondary | ICD-10-CM | POA: Diagnosis not present

## 2021-03-05 DIAGNOSIS — E876 Hypokalemia: Secondary | ICD-10-CM | POA: Diagnosis not present

## 2021-03-05 DIAGNOSIS — E440 Moderate protein-calorie malnutrition: Secondary | ICD-10-CM | POA: Diagnosis not present

## 2021-03-05 NOTE — Progress Notes (Signed)
Cardiology Office Note:    Date:  03/06/2021   ID:  Albert Eaton, DOB 1966/08/15, MRN [id number]  PCP:  Albert Dew, FNP   Albert Eaton Providers Cardiologist:  Albert Mocha, MD Electrophysiologist:  Albert Epley, MD     Referring MD: Albert Dew, FNP   Chief Complaint:  Follow-up (MR, CHF, CAD)    Patient Profile:    Albert Eaton is a 55 y.o. male with:   (HFpEF) heart failure with preserved ejection fraction   Mitral regurgitation   Echocardiogram 5/22: EF 60-65, mod to severe MR  Eval by Dr. Burt Eaton 2/22>>med management   Coronary artery disease   Non-obstructive by Cor CTA in 09/2020   ESRD on hemodialysis  Renal Cell CA s/p L nephrectomy 01/2021 ([facility])  Hypertension   Hyperlipidemia   OSA   HIV+  Atrial Tachycardia  Eval with EP (Dr. Lurline Eaton) in 3/22 >> Conservative mgmt  Amio Rx if symptomatic or more significant   Prior CV studies: Echocardiogram 03/04/21 EF 60-65, no RWMA, mild LVH, normal RVSF, RVSP 44.2, severe LAE, mild RAE, mod to severe MR, asc Ao 39 mm  LONG TERM MONITOR (8-14 DAYS) INTERPRETATION 12/28/2020 Study review shows an average heart rate of 85 bpm with the predominant rhythm is normal sinus.  There are long runs of SVT present with heart rates ranging from 145 to 210 bpm.  There are occasional PVCs and nonsustained ventricular runs.  We will review medical program.  Recommend EP referral.  CT CORONARY MORPH W/CTA COR W/SCORE W/CA W/CM &/OR WO/CM 10/10/2020 RCA is a small non dominant artery that gives rise to PDA and PLVB. There is mild calcified plaque in the proximal RCA with associated stenosis of 25-49%. There is significant noise artifact prohibiting accurate assessment for non calcified plaque.  Left main is a large artery that gives rise to LAD and LCX arteries. There is minimal calcified plaque in the proximal and distal LM with associated stenosis of 1-24%.  LAD is a large vessel that  gives rise to 3 moderate to large sized diagonals. There is mild mixed plaque in the ostial and proximal LAD with associated stenosis of 25-49% and scattered mild calcified plaque in the mid LAD with associated stenosis of 25-49%.  LCX is a dominant artery that gives rise to one large OM1 branch and then a moderate sized LPLA and LPDA. There is mild calcified plaque in the proximal OM1 and proximal PDA with associated stenosis of 25-49%. IMPRESSION: 1. Coronary calcium score of 437. This was 98th percentile for age and sex matched control. 2.  Normal coronary origin with left dominance. 3.  Mild atherosclerosis.  CAD RADS 2. IMPRESSION: 1. Coronary CTA FFR flow analysis demonstrates no hemodynamically flow limiting lesion.  GATED SPECT MYO PERF W/LEXISCAN STRESS 1D 01/31/2020 Narrative  Nuclear stress EF: 51%. The left ventricular ejection fraction is mildly decreased (45-54%). The LV appears to be mildly dilated.  There was no ST segment deviation noted during stress.  This is a low risk study. There is no evidence of ischemia or previous infarction.  The study is normal.  Myocardial Perfusion Imaging Study 10/10/16 EF 48, possible very small area of apical ischemia, low risk  Echo 10/09/16 Moderate LVH, grade 2 diastolic dysfunction, mild MR, moderate LAE  Echo 7/16 [facility] in [place], [name]) Mild AI, mild LAE, mild concentric LVH, EF 55, normal wall motion, mild to moderate MR, mild PI, RVSP 55  Myocardial Perfusion Imaging Study  7/16 [facility] in [place], [name]) Impression: 1.Normal exercise nuclear stress test revealing no evidence of ischemia or scar. 2.Abnormal gated wall motion study revealing moderate global hypokinesis. 3.Abnormal left ventricular ejection fraction of 42%. No results found for this or any previous visit from the past 3650 days.   History of Present Illness: [name] was last seen by Dr. Burt Eaton in 1/22.  He had a f/u  echocardiogram this week that demonstrated normal EF and mod to severe MR.  He returns for f/u.  He is here alone.  He just had left nephrectomy at [facility] 2 weeks ago.  He is still somewhat sore but is improving.  He usually tries to do dialysis at home Monday, Tuesday, Thursday, Friday.  He has been getting to his dry weight.  He has not had significant shortness of breath with exertion.  He has not had chest pain, syncope, leg edema.        Past Medical History:  Diagnosis Date  . Anemia   . Aortic atherosclerosis ([place])   . Asthma   . Chronic diastolic CHF (congestive heart failure) ([place]) 01/06/2017   Echo 7/16 [facility] in [place], [name]) Mild AI, mild LAE, mild concentric LVH, EF 55, normal wall motion, mild to moderate MR, mild PI, RVSP 55 // Echo 10/09/16 ([facility]):  Moderate LVH, grade 2 diastolic dysfunction, mild MR, moderate LAE   . CKD (chronic kidney disease)    per pt, waiting on a kidney transplant in near future.  . Esophagitis   . Gout   . Hepatitis    Hep B  . History of nuclear stress test    a. Nuc study 7/16: no scar or ischemia, EF 42 // b. Nuc study 12/17: EF 48, ?small apical ischemia, Low Risk  . HIV (human immunodeficiency virus infection) ([place])   . HLD (hyperlipidemia)   . Hypertension   . Hypertensive heart disease with CHF (congestive heart failure) ([place]) 10/09/2016  . Mitral regurgitation   . OSA (obstructive sleep apnea)   . Post-operative nausea and vomiting    1 time as a child  . Sleep apnea    uses c-pap  . Wears glasses   . Wears partial dentures     Current Medications: Current Meds  Medication Sig  . ACETAMINOPHEN EXTRA STRENGTH 500 MG tablet Take by mouth.  Marland Kitchen albuterol (VENTOLIN HFA) 108 (90 Base) MCG/ACT inhaler INHALE 2 PUFFS INTO THE LUNGS EVERY 4 HOURS AS NEEDED FOR WHEEZING OR SHORTNESS OF BREATH  . allopurinol (ZYLOPRIM) 100 MG tablet TAKE 1 TABLET(100 MG) BY MOUTH TWICE DAILY (Patient taking differently: Take 100 mg by mouth 2  (two) times daily.)  . amLODipine (NORVASC) 10 MG tablet Take 1 tablet (10 mg total) by mouth daily.  Marland Kitchen aspirin EC 81 MG tablet Take 1 tablet (81 mg total) by mouth daily. Swallow whole.  Marland Kitchen azelastine (ASTELIN) 0.1 % nasal spray Place 2 sprays into both nostrils 2 (two) times daily as needed for rhinitis. Use in each nostril as directed  . BIKTARVY 50-200-25 MG TABS tablet TAKE 1 TABLET BY MOUTH DAILY  . calcitRIOL (ROCALTROL) 0.5 MCG capsule Take 0.5 mcg by mouth 3 (three) times a week.  . carvedilol (COREG) 25 MG tablet TAKE 1 TABLET BY MOUTH TWICE DAILY WITH FOOD (Patient taking differently: Take 25 mg by mouth 2 (two) times daily with a meal.)  . chlorproMAZINE (THORAZINE) 10 MG tablet Take 1 tablet (10 mg total) by mouth 3 (three) times daily  as needed for hiccoughs.  . clonazePAM (KLONOPIN) 0.5 MG tablet Take 1 tablet (0.5 mg total) by mouth at bedtime.  . docusate sodium (COLACE) 100 MG capsule Take 100 mg by mouth 2 (two) times daily.  Marland Kitchen doxycycline (VIBRA-TABS) 100 MG tablet Take 1 tablet (100 mg total) by mouth 2 (two) times daily.  . famotidine (PEPCID) 20 MG tablet TAKE 1 TABLET(20 MG) BY MOUTH TWICE DAILY (Patient taking differently: Take 20 mg by mouth 2 (two) times daily.)  . FLOVENT HFA 110 MCG/ACT inhaler INHALE 2 PUFFS INTO THE LUNGS TWICE DAILY  . gabapentin (NEURONTIN) 300 MG capsule TAKE 1 CAPSULE(300 MG) BY MOUTH THREE TIMES DAILY (Patient taking differently: Take 300 mg by mouth 3 (three) times daily.)  . hydrALAZINE (APRESOLINE) 100 MG tablet Take 1 tablet (100 mg total) by mouth 3 (three) times daily.  . hyoscyamine (LEVSIN SL) 0.125 MG SL tablet Place 1-2 tablets (0.125-0.25 mg total) under the tongue every 8 (eight) hours as needed (lower abdominal pain, spasm).  Marland Kitchen ipratropium (ATROVENT) 0.06 % nasal spray USE 2 SPRAYS IN EACH NOSTRIL 2 TO 3 TIMES DAILY AS NEEDED (Patient taking differently: Place 2 sprays into both nostrils 3 (three) times daily as needed for rhinitis.)   . iron sucrose (VENOFER) 20 MG/ML injection Inject into the vein.  Marland Kitchen lactulose (CHRONULAC) 10 GM/15ML solution Take 20 g by mouth daily as needed for moderate constipation.   Marland Kitchen levothyroxine (SYNTHROID) 25 MCG tablet TAKE 1 TABLET BY MOUTH DAILY BEFORE BREAKFAST (Patient taking differently: Take 25 mcg by mouth daily before breakfast.)  . lidocaine-prilocaine (EMLA) cream Apply 1 application topically See admin instructions. Apply to access site 1-2 hours before dialysis  . lubiprostone (AMITIZA) 24 MCG capsule TAKE 1 CAPSULE(24 MCG) BY MOUTH TWICE DAILY WITH A MEAL  . methocarbamol (ROBAXIN) 500 MG tablet Take 1 tablet (500 mg total) by mouth every 8 (eight) hours as needed for muscle spasms.  . Methoxy PEG-Epoetin Beta (MIRCERA IJ) Inject into the skin.  . multivitamin (RENA-VIT) TABS tablet Take 1 tablet by mouth at bedtime.  . mupirocin ointment (BACTROBAN) 2 % Apply 1 application topically daily as needed (apply to port after dialysis).   . Olopatadine HCl (PAZEO) 0.7 % SOLN Place 1 drop into both eyes daily. (Patient taking differently: Place 1 drop into both eyes daily as needed (itchy eyes).)  . Oxycodone HCl 10 MG TABS Take 10 mg by mouth 4 (four) times daily as needed.  Marland Kitchen PEG-KCl-NaCl-NaSulf-Na Asc-C (PLENVU) 140 g SOLR 1 kit for colon prep  . PRESCRIPTION MEDICATION Take 1 tablet by mouth daily. Multi vitamin from dialysis  . rOPINIRole (REQUIP) 0.5 MG tablet Take 0.5 mg by mouth at bedtime.  . rosuvastatin (CRESTOR) 10 MG tablet Take 1 tablet (10 mg total) by mouth daily.  Marland Kitchen senna (SENOKOT) 8.6 MG TABS tablet Take 2 tablets by mouth daily.  . sevelamer carbonate (RENVELA) 800 MG tablet Take 1,600 mg by mouth 3 (three) times daily.  Marland Kitchen Spacer/Aero-Holding Chambers DEVI 1 Device by Does not apply route 2 (two) times a day.  . Vitamin D, Ergocalciferol, (DRISDOL) 1.25 MG (50000 UNIT) CAPS capsule TAKE 1 CAPSULE BY MOUTH EVERY 7 DAYS (Patient taking differently: Take 50,000 Units by mouth  every Tuesday.)     Allergies:   Ace inhibitors and Lisinopril   Social History   Tobacco Use  . Smoking status: Former Smoker    Types: Cigarettes    Quit date: 2000    Years since  quitting: 22.3  . Smokeless tobacco: Never Used  Vaping Use  . Vaping Use: Never used  Substance Use Topics  . Alcohol use: Yes    Comment: once a month   . Drug use: No     Family Hx: The patient's family history includes Heart attack (age of onset: 66) in his maternal grandmother; Heart failure in his mother; Hypertension in his brother, sister, and sister; Multiple sclerosis in his sister; Scoliosis in an other family member. There is no history of Allergic rhinitis, Angioedema, Asthma, Eczema, Immunodeficiency, Urticaria, Colon cancer, Pancreatic cancer, or Esophageal cancer.  ROS   EKGs/Labs/Other Test Reviewed:    EKG:  EKG is not ordered today.  The ekg ordered today demonstrates n/a  Recent Labs: 05/01/2020: TSH 2.290 08/30/2020: B Natriuretic Peptide 1,331.1; Magnesium 1.8 02/11/2021: ALT 14; BUN 69; Creatinine, Ser 11.28; Hemoglobin 9.5; Platelets 206; Potassium 3.3; Sodium 137   Recent Lipid Panel Lab Results  Component Value Date/Time   CHOL 180 06/27/2020 09:20 AM   CHOL 180 12/09/2017 09:36 AM   TRIG 183 (H) 06/27/2020 09:20 AM   HDL 37 (L) 06/27/2020 09:20 AM   HDL 34 (L) 12/09/2017 09:36 AM   CHOLHDL 4.9 06/27/2020 09:20 AM   LDLCALC 113 (H) 06/27/2020 09:20 AM      Risk Assessment/Calculations:      Physical Exam:    VS:  BP 138/88   Pulse 78   Ht 5' 10"  (1.778 m)   Wt 206 lb (93.4 kg)   SpO2 96%   BMI 29.56 kg/m     Wt Readings from Last 3 Encounters:  03/06/21 206 lb (93.4 kg)  02/26/21 210 lb (95.3 kg)  02/12/21 212 lb (96.2 kg)     Constitutional:      Appearance: Healthy appearance. Not in distress.  Neck:     Vascular: JVD normal.  Pulmonary:     Effort: Pulmonary effort is normal.     Breath sounds: No wheezing. No rales.  Cardiovascular:      Normal rate. Regular rhythm. Normal S1. Normal S2.     Murmurs: There is a grade 2/6 holosystolic murmur at the LLSB.  Edema:    Peripheral edema absent.  Abdominal:     Palpations: Abdomen is soft. There is no hepatomegaly.  Skin:    General: Skin is warm and dry.  Neurological:     General: No focal deficit present.     Mental Status: Alert and oriented to person, place and time.     Cranial Nerves: Cranial nerves are intact.         ASSESSMENT & PLAN:    1. Chronic heart failure with preserved ejection fraction (HCC) NYHA II.  Volume management per dialysis.  Current volume is stable.  Continue current management.  2. ESRD (end stage renal disease) ([place]) He is on home dialysis every Monday, Tuesday, Thursday, Friday.  His nephrologist is Dr. Hollie Salk.  He is still going through evaluation at [facility] for possible transplant.  3. Atrial tachycardia ([place]) He was seen by Dr. Quentin Ore.  As he was asymptomatic, conservative management has been used.  If it becomes an issue down the road, Dr. Quentin Ore has suggested amiodarone therapy.  Continue beta-blocker therapy.  4. Coronary artery disease involving native coronary artery of native heart without angina pectoris Moderate nonobstructive disease by coronary CTA in 12/21.  He is doing well without anginal symptoms he does not have any intolerance to aspirin.  I recommend he start aspirin  81 mg daily.  LDL in 21 was 113.  He was taken off of atorvastatin in the past for unclear reasons.  Start rosuvastatin 10 mg daily.  Obtain lipids and LFTs in 8 weeks.  5. Nonrheumatic mitral valve regurgitation Moderate to severe mitral vegetation by recent echocardiogram.  Overall mitral valve disease appears to be stable.  Follow-up in 6 months.      Dispo:  Return in about 6 months (around 09/06/2021) for Routine Follow Up, w/ Dr. Burt Eaton, or Richardson Dopp, PA-C.   Medication Adjustments/Labs and Tests Ordered: Current medicines are reviewed at  length with the patient today.  Concerns regarding medicines are outlined above.  Tests Ordered: Orders Placed This Encounter  Procedures  . Hepatic function panel  . Lipid panel   Medication Changes: Meds ordered this encounter  Medications  . rosuvastatin (CRESTOR) 10 MG tablet    Sig: Take 1 tablet (10 mg total) by mouth daily.    Dispense:  90 tablet    Refill:  3  . aspirin EC 81 MG tablet    Sig: Take 1 tablet (81 mg total) by mouth daily. Swallow whole.    Dispense:  90 tablet    Refill:  3    Signed, Richardson Dopp, PA-C  03/06/2021 8:49 AM    [facility] [address], [place], [place]  [address] Phone: [phone]; Fax: [phone]

## 2021-03-06 ENCOUNTER — Encounter: Payer: Self-pay | Admitting: Physician Assistant

## 2021-03-06 ENCOUNTER — Telehealth: Payer: Self-pay

## 2021-03-06 ENCOUNTER — Other Ambulatory Visit: Payer: Self-pay

## 2021-03-06 ENCOUNTER — Ambulatory Visit (INDEPENDENT_AMBULATORY_CARE_PROVIDER_SITE_OTHER): Payer: Medicare Other | Admitting: Physician Assistant

## 2021-03-06 VITALS — BP 138/88 | HR 78 | Ht 70.0 in | Wt 206.0 lb

## 2021-03-06 DIAGNOSIS — I2510 Atherosclerotic heart disease of native coronary artery without angina pectoris: Secondary | ICD-10-CM

## 2021-03-06 DIAGNOSIS — N186 End stage renal disease: Secondary | ICD-10-CM

## 2021-03-06 DIAGNOSIS — Z992 Dependence on renal dialysis: Secondary | ICD-10-CM | POA: Diagnosis not present

## 2021-03-06 DIAGNOSIS — R82998 Other abnormal findings in urine: Secondary | ICD-10-CM | POA: Diagnosis not present

## 2021-03-06 DIAGNOSIS — I340 Nonrheumatic mitral (valve) insufficiency: Secondary | ICD-10-CM | POA: Diagnosis not present

## 2021-03-06 DIAGNOSIS — E876 Hypokalemia: Secondary | ICD-10-CM | POA: Diagnosis not present

## 2021-03-06 DIAGNOSIS — K7689 Other specified diseases of liver: Secondary | ICD-10-CM | POA: Diagnosis not present

## 2021-03-06 DIAGNOSIS — D509 Iron deficiency anemia, unspecified: Secondary | ICD-10-CM | POA: Diagnosis not present

## 2021-03-06 DIAGNOSIS — Z79899 Other long term (current) drug therapy: Secondary | ICD-10-CM | POA: Diagnosis not present

## 2021-03-06 DIAGNOSIS — I471 Supraventricular tachycardia: Secondary | ICD-10-CM

## 2021-03-06 DIAGNOSIS — E440 Moderate protein-calorie malnutrition: Secondary | ICD-10-CM | POA: Diagnosis not present

## 2021-03-06 DIAGNOSIS — Z4931 Encounter for adequacy testing for hemodialysis: Secondary | ICD-10-CM | POA: Diagnosis not present

## 2021-03-06 DIAGNOSIS — E8770 Fluid overload, unspecified: Secondary | ICD-10-CM | POA: Diagnosis not present

## 2021-03-06 DIAGNOSIS — I5032 Chronic diastolic (congestive) heart failure: Secondary | ICD-10-CM | POA: Diagnosis not present

## 2021-03-06 DIAGNOSIS — N2581 Secondary hyperparathyroidism of renal origin: Secondary | ICD-10-CM | POA: Diagnosis not present

## 2021-03-06 DIAGNOSIS — Z006 Encounter for examination for normal comparison and control in clinical research program: Secondary | ICD-10-CM

## 2021-03-06 DIAGNOSIS — D631 Anemia in chronic kidney disease: Secondary | ICD-10-CM | POA: Diagnosis not present

## 2021-03-06 MED ORDER — ROSUVASTATIN CALCIUM 10 MG PO TABS: 10.0000 mg | ORAL_TABLET | Freq: Every day | ORAL | 3 refills | Status: DC

## 2021-03-06 MED ORDER — ASPIRIN EC 81 MG PO TBEC: 81.0000 mg | DELAYED_RELEASE_TABLET | Freq: Every day | ORAL | 3 refills | Status: DC

## 2021-03-06 NOTE — Telephone Encounter (Signed)
I have attempted without success to contact this patient by phone for his Identify 90 day follow up phone call. I left a message for patient to return my phone call with my name and callback number. An e-mail was also sent to patient.

## 2021-03-06 NOTE — Patient Instructions (Addendum)
Medication Instructions:  Your physician has recommended you make the following change in your medication:  1.  START Rosuvastatin 10 mg taking 1 tablet daily 2.  START Aspirin 81 mg taking 1 daily   *If you need a refill on your cardiac medications before your next appointment, please call your pharmacy*   Lab Work: 8 WEEKS:  FASTING LIPID & LFT  If you have labs (blood work) drawn today and your tests are completely normal, you will receive your results only by: Marland Kitchen MyChart Message (if you have MyChart) OR . A paper copy in the mail If you have any lab test that is abnormal or we need to change your treatment, we will call you to review the results.   Testing/Procedures: None ordered   Follow-Up: At [facility], you and your health needs are our priority.  As part of our continuing mission to provide you with exceptional heart care, we have created designated Provider Care Teams.  These Care Teams include your primary Cardiologist (physician) and Advanced Practice Providers (APPs -  Physician Assistants and Nurse Practitioners) who all work together to provide you with the care you need, when you need it.  We recommend signing up for the patient portal called "MyChart".  Sign up information is provided on this After Visit Summary.  MyChart is used to connect with patients for Virtual Visits (Telemedicine).  Patients are able to view lab/test results, encounter notes, upcoming appointments, etc.  Non-urgent messages can be sent to your provider as well.   To learn more about what you can do with MyChart, go to [email or web address].    Your next appointment:   6 month(s)  The format for your next appointment:   In Person  Provider:   [name], MD or Richardson Dopp, PA-C   Other Instructions

## 2021-03-07 DIAGNOSIS — N2581 Secondary hyperparathyroidism of renal origin: Secondary | ICD-10-CM | POA: Diagnosis not present

## 2021-03-07 DIAGNOSIS — D631 Anemia in chronic kidney disease: Secondary | ICD-10-CM | POA: Diagnosis not present

## 2021-03-07 DIAGNOSIS — Z79899 Other long term (current) drug therapy: Secondary | ICD-10-CM | POA: Diagnosis not present

## 2021-03-07 DIAGNOSIS — K7689 Other specified diseases of liver: Secondary | ICD-10-CM | POA: Diagnosis not present

## 2021-03-07 DIAGNOSIS — D509 Iron deficiency anemia, unspecified: Secondary | ICD-10-CM | POA: Diagnosis not present

## 2021-03-07 DIAGNOSIS — Z4931 Encounter for adequacy testing for hemodialysis: Secondary | ICD-10-CM | POA: Diagnosis not present

## 2021-03-07 DIAGNOSIS — E440 Moderate protein-calorie malnutrition: Secondary | ICD-10-CM | POA: Diagnosis not present

## 2021-03-07 DIAGNOSIS — E876 Hypokalemia: Secondary | ICD-10-CM | POA: Diagnosis not present

## 2021-03-07 DIAGNOSIS — Z992 Dependence on renal dialysis: Secondary | ICD-10-CM | POA: Diagnosis not present

## 2021-03-07 DIAGNOSIS — R82998 Other abnormal findings in urine: Secondary | ICD-10-CM | POA: Diagnosis not present

## 2021-03-07 DIAGNOSIS — E8770 Fluid overload, unspecified: Secondary | ICD-10-CM | POA: Diagnosis not present

## 2021-03-07 DIAGNOSIS — N186 End stage renal disease: Secondary | ICD-10-CM | POA: Diagnosis not present

## 2021-03-08 DIAGNOSIS — E8770 Fluid overload, unspecified: Secondary | ICD-10-CM | POA: Diagnosis not present

## 2021-03-08 DIAGNOSIS — D631 Anemia in chronic kidney disease: Secondary | ICD-10-CM | POA: Diagnosis not present

## 2021-03-08 DIAGNOSIS — N186 End stage renal disease: Secondary | ICD-10-CM | POA: Diagnosis not present

## 2021-03-08 DIAGNOSIS — K7689 Other specified diseases of liver: Secondary | ICD-10-CM | POA: Diagnosis not present

## 2021-03-08 DIAGNOSIS — Z992 Dependence on renal dialysis: Secondary | ICD-10-CM | POA: Diagnosis not present

## 2021-03-08 DIAGNOSIS — Z79899 Other long term (current) drug therapy: Secondary | ICD-10-CM | POA: Diagnosis not present

## 2021-03-08 DIAGNOSIS — E440 Moderate protein-calorie malnutrition: Secondary | ICD-10-CM | POA: Diagnosis not present

## 2021-03-08 DIAGNOSIS — R82998 Other abnormal findings in urine: Secondary | ICD-10-CM | POA: Diagnosis not present

## 2021-03-08 DIAGNOSIS — Z4931 Encounter for adequacy testing for hemodialysis: Secondary | ICD-10-CM | POA: Diagnosis not present

## 2021-03-08 DIAGNOSIS — E876 Hypokalemia: Secondary | ICD-10-CM | POA: Diagnosis not present

## 2021-03-08 DIAGNOSIS — N2581 Secondary hyperparathyroidism of renal origin: Secondary | ICD-10-CM | POA: Diagnosis not present

## 2021-03-08 DIAGNOSIS — D509 Iron deficiency anemia, unspecified: Secondary | ICD-10-CM | POA: Diagnosis not present

## 2021-03-11 DIAGNOSIS — N186 End stage renal disease: Secondary | ICD-10-CM | POA: Diagnosis not present

## 2021-03-11 DIAGNOSIS — E8770 Fluid overload, unspecified: Secondary | ICD-10-CM | POA: Diagnosis not present

## 2021-03-11 DIAGNOSIS — E440 Moderate protein-calorie malnutrition: Secondary | ICD-10-CM | POA: Diagnosis not present

## 2021-03-11 DIAGNOSIS — D631 Anemia in chronic kidney disease: Secondary | ICD-10-CM | POA: Diagnosis not present

## 2021-03-11 DIAGNOSIS — Z992 Dependence on renal dialysis: Secondary | ICD-10-CM | POA: Diagnosis not present

## 2021-03-11 DIAGNOSIS — Z4931 Encounter for adequacy testing for hemodialysis: Secondary | ICD-10-CM | POA: Diagnosis not present

## 2021-03-11 DIAGNOSIS — E876 Hypokalemia: Secondary | ICD-10-CM | POA: Diagnosis not present

## 2021-03-11 DIAGNOSIS — N2581 Secondary hyperparathyroidism of renal origin: Secondary | ICD-10-CM | POA: Diagnosis not present

## 2021-03-11 DIAGNOSIS — R82998 Other abnormal findings in urine: Secondary | ICD-10-CM | POA: Diagnosis not present

## 2021-03-11 DIAGNOSIS — D509 Iron deficiency anemia, unspecified: Secondary | ICD-10-CM | POA: Diagnosis not present

## 2021-03-11 DIAGNOSIS — K7689 Other specified diseases of liver: Secondary | ICD-10-CM | POA: Diagnosis not present

## 2021-03-11 DIAGNOSIS — Z79899 Other long term (current) drug therapy: Secondary | ICD-10-CM | POA: Diagnosis not present

## 2021-03-12 ENCOUNTER — Encounter: Payer: Self-pay | Admitting: Family Medicine

## 2021-03-12 DIAGNOSIS — Z4931 Encounter for adequacy testing for hemodialysis: Secondary | ICD-10-CM | POA: Diagnosis not present

## 2021-03-12 DIAGNOSIS — Z992 Dependence on renal dialysis: Secondary | ICD-10-CM | POA: Diagnosis not present

## 2021-03-12 DIAGNOSIS — N2581 Secondary hyperparathyroidism of renal origin: Secondary | ICD-10-CM | POA: Diagnosis not present

## 2021-03-12 DIAGNOSIS — E8770 Fluid overload, unspecified: Secondary | ICD-10-CM | POA: Diagnosis not present

## 2021-03-12 DIAGNOSIS — D509 Iron deficiency anemia, unspecified: Secondary | ICD-10-CM | POA: Diagnosis not present

## 2021-03-12 DIAGNOSIS — R82998 Other abnormal findings in urine: Secondary | ICD-10-CM | POA: Diagnosis not present

## 2021-03-12 DIAGNOSIS — N186 End stage renal disease: Secondary | ICD-10-CM | POA: Diagnosis not present

## 2021-03-12 DIAGNOSIS — D631 Anemia in chronic kidney disease: Secondary | ICD-10-CM | POA: Diagnosis not present

## 2021-03-12 DIAGNOSIS — E876 Hypokalemia: Secondary | ICD-10-CM | POA: Diagnosis not present

## 2021-03-12 DIAGNOSIS — Z79899 Other long term (current) drug therapy: Secondary | ICD-10-CM | POA: Diagnosis not present

## 2021-03-12 DIAGNOSIS — E440 Moderate protein-calorie malnutrition: Secondary | ICD-10-CM | POA: Diagnosis not present

## 2021-03-12 DIAGNOSIS — K7689 Other specified diseases of liver: Secondary | ICD-10-CM | POA: Diagnosis not present

## 2021-03-13 DIAGNOSIS — N186 End stage renal disease: Secondary | ICD-10-CM | POA: Diagnosis not present

## 2021-03-13 DIAGNOSIS — E440 Moderate protein-calorie malnutrition: Secondary | ICD-10-CM | POA: Diagnosis not present

## 2021-03-13 DIAGNOSIS — K7689 Other specified diseases of liver: Secondary | ICD-10-CM | POA: Diagnosis not present

## 2021-03-13 DIAGNOSIS — Z79899 Other long term (current) drug therapy: Secondary | ICD-10-CM | POA: Diagnosis not present

## 2021-03-13 DIAGNOSIS — Z992 Dependence on renal dialysis: Secondary | ICD-10-CM | POA: Diagnosis not present

## 2021-03-13 DIAGNOSIS — D631 Anemia in chronic kidney disease: Secondary | ICD-10-CM | POA: Diagnosis not present

## 2021-03-13 DIAGNOSIS — N2581 Secondary hyperparathyroidism of renal origin: Secondary | ICD-10-CM | POA: Diagnosis not present

## 2021-03-13 DIAGNOSIS — Z4931 Encounter for adequacy testing for hemodialysis: Secondary | ICD-10-CM | POA: Diagnosis not present

## 2021-03-13 DIAGNOSIS — D509 Iron deficiency anemia, unspecified: Secondary | ICD-10-CM | POA: Diagnosis not present

## 2021-03-13 DIAGNOSIS — E876 Hypokalemia: Secondary | ICD-10-CM | POA: Diagnosis not present

## 2021-03-13 DIAGNOSIS — R82998 Other abnormal findings in urine: Secondary | ICD-10-CM | POA: Diagnosis not present

## 2021-03-13 DIAGNOSIS — E8770 Fluid overload, unspecified: Secondary | ICD-10-CM | POA: Diagnosis not present

## 2021-03-14 DIAGNOSIS — D509 Iron deficiency anemia, unspecified: Secondary | ICD-10-CM | POA: Diagnosis not present

## 2021-03-14 DIAGNOSIS — R82998 Other abnormal findings in urine: Secondary | ICD-10-CM | POA: Diagnosis not present

## 2021-03-14 DIAGNOSIS — N186 End stage renal disease: Secondary | ICD-10-CM | POA: Diagnosis not present

## 2021-03-14 DIAGNOSIS — N2581 Secondary hyperparathyroidism of renal origin: Secondary | ICD-10-CM | POA: Diagnosis not present

## 2021-03-14 DIAGNOSIS — Z992 Dependence on renal dialysis: Secondary | ICD-10-CM | POA: Diagnosis not present

## 2021-03-14 DIAGNOSIS — D631 Anemia in chronic kidney disease: Secondary | ICD-10-CM | POA: Diagnosis not present

## 2021-03-14 DIAGNOSIS — E8770 Fluid overload, unspecified: Secondary | ICD-10-CM | POA: Diagnosis not present

## 2021-03-14 DIAGNOSIS — E876 Hypokalemia: Secondary | ICD-10-CM | POA: Diagnosis not present

## 2021-03-14 DIAGNOSIS — E440 Moderate protein-calorie malnutrition: Secondary | ICD-10-CM | POA: Diagnosis not present

## 2021-03-14 DIAGNOSIS — K7689 Other specified diseases of liver: Secondary | ICD-10-CM | POA: Diagnosis not present

## 2021-03-14 DIAGNOSIS — Z79899 Other long term (current) drug therapy: Secondary | ICD-10-CM | POA: Diagnosis not present

## 2021-03-14 DIAGNOSIS — Z4931 Encounter for adequacy testing for hemodialysis: Secondary | ICD-10-CM | POA: Diagnosis not present

## 2021-03-15 DIAGNOSIS — D509 Iron deficiency anemia, unspecified: Secondary | ICD-10-CM | POA: Diagnosis not present

## 2021-03-15 DIAGNOSIS — N2581 Secondary hyperparathyroidism of renal origin: Secondary | ICD-10-CM | POA: Diagnosis not present

## 2021-03-15 DIAGNOSIS — E8770 Fluid overload, unspecified: Secondary | ICD-10-CM | POA: Diagnosis not present

## 2021-03-15 DIAGNOSIS — N186 End stage renal disease: Secondary | ICD-10-CM | POA: Diagnosis not present

## 2021-03-15 DIAGNOSIS — R82998 Other abnormal findings in urine: Secondary | ICD-10-CM | POA: Diagnosis not present

## 2021-03-15 DIAGNOSIS — E876 Hypokalemia: Secondary | ICD-10-CM | POA: Diagnosis not present

## 2021-03-15 DIAGNOSIS — E440 Moderate protein-calorie malnutrition: Secondary | ICD-10-CM | POA: Diagnosis not present

## 2021-03-15 DIAGNOSIS — Z4931 Encounter for adequacy testing for hemodialysis: Secondary | ICD-10-CM | POA: Diagnosis not present

## 2021-03-15 DIAGNOSIS — Z992 Dependence on renal dialysis: Secondary | ICD-10-CM | POA: Diagnosis not present

## 2021-03-15 DIAGNOSIS — D631 Anemia in chronic kidney disease: Secondary | ICD-10-CM | POA: Diagnosis not present

## 2021-03-15 DIAGNOSIS — K7689 Other specified diseases of liver: Secondary | ICD-10-CM | POA: Diagnosis not present

## 2021-03-15 DIAGNOSIS — Z79899 Other long term (current) drug therapy: Secondary | ICD-10-CM | POA: Diagnosis not present

## 2021-03-18 DIAGNOSIS — R82998 Other abnormal findings in urine: Secondary | ICD-10-CM | POA: Diagnosis not present

## 2021-03-18 DIAGNOSIS — Z79899 Other long term (current) drug therapy: Secondary | ICD-10-CM | POA: Diagnosis not present

## 2021-03-18 DIAGNOSIS — N2581 Secondary hyperparathyroidism of renal origin: Secondary | ICD-10-CM | POA: Diagnosis not present

## 2021-03-18 DIAGNOSIS — E440 Moderate protein-calorie malnutrition: Secondary | ICD-10-CM | POA: Diagnosis not present

## 2021-03-18 DIAGNOSIS — D631 Anemia in chronic kidney disease: Secondary | ICD-10-CM | POA: Diagnosis not present

## 2021-03-18 DIAGNOSIS — E876 Hypokalemia: Secondary | ICD-10-CM | POA: Diagnosis not present

## 2021-03-18 DIAGNOSIS — Z4931 Encounter for adequacy testing for hemodialysis: Secondary | ICD-10-CM | POA: Diagnosis not present

## 2021-03-18 DIAGNOSIS — Z992 Dependence on renal dialysis: Secondary | ICD-10-CM | POA: Diagnosis not present

## 2021-03-18 DIAGNOSIS — N186 End stage renal disease: Secondary | ICD-10-CM | POA: Diagnosis not present

## 2021-03-18 DIAGNOSIS — K7689 Other specified diseases of liver: Secondary | ICD-10-CM | POA: Diagnosis not present

## 2021-03-18 DIAGNOSIS — D509 Iron deficiency anemia, unspecified: Secondary | ICD-10-CM | POA: Diagnosis not present

## 2021-03-18 DIAGNOSIS — E8770 Fluid overload, unspecified: Secondary | ICD-10-CM | POA: Diagnosis not present

## 2021-03-19 DIAGNOSIS — N2581 Secondary hyperparathyroidism of renal origin: Secondary | ICD-10-CM | POA: Diagnosis not present

## 2021-03-19 DIAGNOSIS — Z79899 Other long term (current) drug therapy: Secondary | ICD-10-CM | POA: Diagnosis not present

## 2021-03-19 DIAGNOSIS — K7689 Other specified diseases of liver: Secondary | ICD-10-CM | POA: Diagnosis not present

## 2021-03-19 DIAGNOSIS — Z4931 Encounter for adequacy testing for hemodialysis: Secondary | ICD-10-CM | POA: Diagnosis not present

## 2021-03-19 DIAGNOSIS — Z992 Dependence on renal dialysis: Secondary | ICD-10-CM | POA: Diagnosis not present

## 2021-03-19 DIAGNOSIS — E876 Hypokalemia: Secondary | ICD-10-CM | POA: Diagnosis not present

## 2021-03-19 DIAGNOSIS — D631 Anemia in chronic kidney disease: Secondary | ICD-10-CM | POA: Diagnosis not present

## 2021-03-19 DIAGNOSIS — N186 End stage renal disease: Secondary | ICD-10-CM | POA: Diagnosis not present

## 2021-03-19 DIAGNOSIS — R82998 Other abnormal findings in urine: Secondary | ICD-10-CM | POA: Diagnosis not present

## 2021-03-19 DIAGNOSIS — E8770 Fluid overload, unspecified: Secondary | ICD-10-CM | POA: Diagnosis not present

## 2021-03-19 DIAGNOSIS — D509 Iron deficiency anemia, unspecified: Secondary | ICD-10-CM | POA: Diagnosis not present

## 2021-03-19 DIAGNOSIS — E440 Moderate protein-calorie malnutrition: Secondary | ICD-10-CM | POA: Diagnosis not present

## 2021-03-21 DIAGNOSIS — N2581 Secondary hyperparathyroidism of renal origin: Secondary | ICD-10-CM | POA: Diagnosis not present

## 2021-03-21 DIAGNOSIS — K7689 Other specified diseases of liver: Secondary | ICD-10-CM | POA: Diagnosis not present

## 2021-03-21 DIAGNOSIS — D631 Anemia in chronic kidney disease: Secondary | ICD-10-CM | POA: Diagnosis not present

## 2021-03-21 DIAGNOSIS — N186 End stage renal disease: Secondary | ICD-10-CM | POA: Diagnosis not present

## 2021-03-21 DIAGNOSIS — E876 Hypokalemia: Secondary | ICD-10-CM | POA: Diagnosis not present

## 2021-03-21 DIAGNOSIS — Z79899 Other long term (current) drug therapy: Secondary | ICD-10-CM | POA: Diagnosis not present

## 2021-03-21 DIAGNOSIS — E440 Moderate protein-calorie malnutrition: Secondary | ICD-10-CM | POA: Diagnosis not present

## 2021-03-21 DIAGNOSIS — Z4931 Encounter for adequacy testing for hemodialysis: Secondary | ICD-10-CM | POA: Diagnosis not present

## 2021-03-21 DIAGNOSIS — R82998 Other abnormal findings in urine: Secondary | ICD-10-CM | POA: Diagnosis not present

## 2021-03-21 DIAGNOSIS — D509 Iron deficiency anemia, unspecified: Secondary | ICD-10-CM | POA: Diagnosis not present

## 2021-03-21 DIAGNOSIS — Z992 Dependence on renal dialysis: Secondary | ICD-10-CM | POA: Diagnosis not present

## 2021-03-21 DIAGNOSIS — E8770 Fluid overload, unspecified: Secondary | ICD-10-CM | POA: Diagnosis not present

## 2021-03-22 DIAGNOSIS — Z4931 Encounter for adequacy testing for hemodialysis: Secondary | ICD-10-CM | POA: Diagnosis not present

## 2021-03-22 DIAGNOSIS — D631 Anemia in chronic kidney disease: Secondary | ICD-10-CM | POA: Diagnosis not present

## 2021-03-22 DIAGNOSIS — Z992 Dependence on renal dialysis: Secondary | ICD-10-CM | POA: Diagnosis not present

## 2021-03-22 DIAGNOSIS — N186 End stage renal disease: Secondary | ICD-10-CM | POA: Diagnosis not present

## 2021-03-22 DIAGNOSIS — D509 Iron deficiency anemia, unspecified: Secondary | ICD-10-CM | POA: Diagnosis not present

## 2021-03-22 DIAGNOSIS — E440 Moderate protein-calorie malnutrition: Secondary | ICD-10-CM | POA: Diagnosis not present

## 2021-03-22 DIAGNOSIS — E876 Hypokalemia: Secondary | ICD-10-CM | POA: Diagnosis not present

## 2021-03-22 DIAGNOSIS — E8770 Fluid overload, unspecified: Secondary | ICD-10-CM | POA: Diagnosis not present

## 2021-03-22 DIAGNOSIS — N2581 Secondary hyperparathyroidism of renal origin: Secondary | ICD-10-CM | POA: Diagnosis not present

## 2021-03-22 DIAGNOSIS — K7689 Other specified diseases of liver: Secondary | ICD-10-CM | POA: Diagnosis not present

## 2021-03-22 DIAGNOSIS — Z79899 Other long term (current) drug therapy: Secondary | ICD-10-CM | POA: Diagnosis not present

## 2021-03-22 DIAGNOSIS — R82998 Other abnormal findings in urine: Secondary | ICD-10-CM | POA: Diagnosis not present

## 2021-03-24 ENCOUNTER — Other Ambulatory Visit: Payer: Self-pay | Admitting: Cardiovascular Disease

## 2021-03-25 DIAGNOSIS — E440 Moderate protein-calorie malnutrition: Secondary | ICD-10-CM | POA: Diagnosis not present

## 2021-03-25 DIAGNOSIS — D631 Anemia in chronic kidney disease: Secondary | ICD-10-CM | POA: Diagnosis not present

## 2021-03-25 DIAGNOSIS — K7689 Other specified diseases of liver: Secondary | ICD-10-CM | POA: Diagnosis not present

## 2021-03-25 DIAGNOSIS — Z4931 Encounter for adequacy testing for hemodialysis: Secondary | ICD-10-CM | POA: Diagnosis not present

## 2021-03-25 DIAGNOSIS — N186 End stage renal disease: Secondary | ICD-10-CM | POA: Diagnosis not present

## 2021-03-25 DIAGNOSIS — E8770 Fluid overload, unspecified: Secondary | ICD-10-CM | POA: Diagnosis not present

## 2021-03-25 DIAGNOSIS — Z79899 Other long term (current) drug therapy: Secondary | ICD-10-CM | POA: Diagnosis not present

## 2021-03-25 DIAGNOSIS — N2581 Secondary hyperparathyroidism of renal origin: Secondary | ICD-10-CM | POA: Diagnosis not present

## 2021-03-25 DIAGNOSIS — R82998 Other abnormal findings in urine: Secondary | ICD-10-CM | POA: Diagnosis not present

## 2021-03-25 DIAGNOSIS — E876 Hypokalemia: Secondary | ICD-10-CM | POA: Diagnosis not present

## 2021-03-25 DIAGNOSIS — D509 Iron deficiency anemia, unspecified: Secondary | ICD-10-CM | POA: Diagnosis not present

## 2021-03-25 DIAGNOSIS — Z992 Dependence on renal dialysis: Secondary | ICD-10-CM | POA: Diagnosis not present

## 2021-03-26 DIAGNOSIS — I129 Hypertensive chronic kidney disease with stage 1 through stage 4 chronic kidney disease, or unspecified chronic kidney disease: Secondary | ICD-10-CM | POA: Diagnosis not present

## 2021-03-26 DIAGNOSIS — E8770 Fluid overload, unspecified: Secondary | ICD-10-CM | POA: Diagnosis not present

## 2021-03-26 DIAGNOSIS — Z4931 Encounter for adequacy testing for hemodialysis: Secondary | ICD-10-CM | POA: Diagnosis not present

## 2021-03-26 DIAGNOSIS — N2581 Secondary hyperparathyroidism of renal origin: Secondary | ICD-10-CM | POA: Diagnosis not present

## 2021-03-26 DIAGNOSIS — Z79899 Other long term (current) drug therapy: Secondary | ICD-10-CM | POA: Diagnosis not present

## 2021-03-26 DIAGNOSIS — D509 Iron deficiency anemia, unspecified: Secondary | ICD-10-CM | POA: Diagnosis not present

## 2021-03-26 DIAGNOSIS — K7689 Other specified diseases of liver: Secondary | ICD-10-CM | POA: Diagnosis not present

## 2021-03-26 DIAGNOSIS — E876 Hypokalemia: Secondary | ICD-10-CM | POA: Diagnosis not present

## 2021-03-26 DIAGNOSIS — D631 Anemia in chronic kidney disease: Secondary | ICD-10-CM | POA: Diagnosis not present

## 2021-03-26 DIAGNOSIS — R82998 Other abnormal findings in urine: Secondary | ICD-10-CM | POA: Diagnosis not present

## 2021-03-26 DIAGNOSIS — E440 Moderate protein-calorie malnutrition: Secondary | ICD-10-CM | POA: Diagnosis not present

## 2021-03-26 DIAGNOSIS — Z992 Dependence on renal dialysis: Secondary | ICD-10-CM | POA: Diagnosis not present

## 2021-03-26 DIAGNOSIS — N186 End stage renal disease: Secondary | ICD-10-CM | POA: Diagnosis not present

## 2021-03-27 DIAGNOSIS — N2581 Secondary hyperparathyroidism of renal origin: Secondary | ICD-10-CM | POA: Diagnosis not present

## 2021-03-27 DIAGNOSIS — D509 Iron deficiency anemia, unspecified: Secondary | ICD-10-CM | POA: Diagnosis not present

## 2021-03-27 DIAGNOSIS — Z4931 Encounter for adequacy testing for hemodialysis: Secondary | ICD-10-CM | POA: Diagnosis not present

## 2021-03-27 DIAGNOSIS — E876 Hypokalemia: Secondary | ICD-10-CM | POA: Diagnosis not present

## 2021-03-27 DIAGNOSIS — E8770 Fluid overload, unspecified: Secondary | ICD-10-CM | POA: Diagnosis not present

## 2021-03-27 DIAGNOSIS — R82998 Other abnormal findings in urine: Secondary | ICD-10-CM | POA: Diagnosis not present

## 2021-03-27 DIAGNOSIS — D631 Anemia in chronic kidney disease: Secondary | ICD-10-CM | POA: Diagnosis not present

## 2021-03-27 DIAGNOSIS — Z79899 Other long term (current) drug therapy: Secondary | ICD-10-CM | POA: Diagnosis not present

## 2021-03-27 DIAGNOSIS — N186 End stage renal disease: Secondary | ICD-10-CM | POA: Diagnosis not present

## 2021-03-27 DIAGNOSIS — Z992 Dependence on renal dialysis: Secondary | ICD-10-CM | POA: Diagnosis not present

## 2021-03-27 DIAGNOSIS — E440 Moderate protein-calorie malnutrition: Secondary | ICD-10-CM | POA: Diagnosis not present

## 2021-03-27 DIAGNOSIS — K7689 Other specified diseases of liver: Secondary | ICD-10-CM | POA: Diagnosis not present

## 2021-03-28 DIAGNOSIS — K7689 Other specified diseases of liver: Secondary | ICD-10-CM | POA: Diagnosis not present

## 2021-03-28 DIAGNOSIS — D631 Anemia in chronic kidney disease: Secondary | ICD-10-CM | POA: Diagnosis not present

## 2021-03-28 DIAGNOSIS — N2581 Secondary hyperparathyroidism of renal origin: Secondary | ICD-10-CM | POA: Diagnosis not present

## 2021-03-28 DIAGNOSIS — D509 Iron deficiency anemia, unspecified: Secondary | ICD-10-CM | POA: Diagnosis not present

## 2021-03-28 DIAGNOSIS — N186 End stage renal disease: Secondary | ICD-10-CM | POA: Diagnosis not present

## 2021-03-28 DIAGNOSIS — E8770 Fluid overload, unspecified: Secondary | ICD-10-CM | POA: Diagnosis not present

## 2021-03-28 DIAGNOSIS — Z79899 Other long term (current) drug therapy: Secondary | ICD-10-CM | POA: Diagnosis not present

## 2021-03-28 DIAGNOSIS — E876 Hypokalemia: Secondary | ICD-10-CM | POA: Diagnosis not present

## 2021-03-28 DIAGNOSIS — Z4931 Encounter for adequacy testing for hemodialysis: Secondary | ICD-10-CM | POA: Diagnosis not present

## 2021-03-28 DIAGNOSIS — E440 Moderate protein-calorie malnutrition: Secondary | ICD-10-CM | POA: Diagnosis not present

## 2021-03-28 DIAGNOSIS — R82998 Other abnormal findings in urine: Secondary | ICD-10-CM | POA: Diagnosis not present

## 2021-03-28 DIAGNOSIS — Z992 Dependence on renal dialysis: Secondary | ICD-10-CM | POA: Diagnosis not present

## 2021-04-01 ENCOUNTER — Emergency Department (HOSPITAL_COMMUNITY)
Admission: EM | Admit: 2021-04-01 | Discharge: 2021-04-01 | Disposition: A | Discharge status: Home / Self Care | Payer: Medicare Other | Attending: Emergency Medicine | Admitting: Emergency Medicine

## 2021-04-01 ENCOUNTER — Encounter (HOSPITAL_COMMUNITY): Payer: Self-pay | Admitting: Emergency Medicine

## 2021-04-01 ENCOUNTER — Other Ambulatory Visit: Payer: Self-pay | Admitting: Cardiovascular Disease

## 2021-04-01 ENCOUNTER — Other Ambulatory Visit: Payer: Self-pay

## 2021-04-01 ENCOUNTER — Emergency Department (HOSPITAL_COMMUNITY): Payer: Medicare Other

## 2021-04-01 DIAGNOSIS — J4520 Mild intermittent asthma, uncomplicated: Secondary | ICD-10-CM | POA: Diagnosis not present

## 2021-04-01 DIAGNOSIS — E876 Hypokalemia: Secondary | ICD-10-CM | POA: Diagnosis not present

## 2021-04-01 DIAGNOSIS — R82998 Other abnormal findings in urine: Secondary | ICD-10-CM | POA: Diagnosis not present

## 2021-04-01 DIAGNOSIS — R066 Hiccough: Secondary | ICD-10-CM | POA: Diagnosis not present

## 2021-04-01 DIAGNOSIS — E039 Hypothyroidism, unspecified: Secondary | ICD-10-CM | POA: Insufficient documentation

## 2021-04-01 DIAGNOSIS — Z992 Dependence on renal dialysis: Secondary | ICD-10-CM | POA: Diagnosis not present

## 2021-04-01 DIAGNOSIS — Z7951 Long term (current) use of inhaled steroids: Secondary | ICD-10-CM | POA: Diagnosis not present

## 2021-04-01 DIAGNOSIS — Z21 Asymptomatic human immunodeficiency virus [HIV] infection status: Secondary | ICD-10-CM | POA: Diagnosis not present

## 2021-04-01 DIAGNOSIS — Z87891 Personal history of nicotine dependence: Secondary | ICD-10-CM | POA: Insufficient documentation

## 2021-04-01 DIAGNOSIS — K7689 Other specified diseases of liver: Secondary | ICD-10-CM | POA: Diagnosis not present

## 2021-04-01 DIAGNOSIS — I517 Cardiomegaly: Secondary | ICD-10-CM | POA: Diagnosis not present

## 2021-04-01 DIAGNOSIS — E8770 Fluid overload, unspecified: Secondary | ICD-10-CM | POA: Diagnosis not present

## 2021-04-01 DIAGNOSIS — I5032 Chronic diastolic (congestive) heart failure: Secondary | ICD-10-CM | POA: Insufficient documentation

## 2021-04-01 DIAGNOSIS — D631 Anemia in chronic kidney disease: Secondary | ICD-10-CM | POA: Insufficient documentation

## 2021-04-01 DIAGNOSIS — I132 Hypertensive heart and chronic kidney disease with heart failure and with stage 5 chronic kidney disease, or end stage renal disease: Secondary | ICD-10-CM | POA: Insufficient documentation

## 2021-04-01 DIAGNOSIS — R079 Chest pain, unspecified: Secondary | ICD-10-CM | POA: Diagnosis not present

## 2021-04-01 DIAGNOSIS — R0789 Other chest pain: Secondary | ICD-10-CM | POA: Insufficient documentation

## 2021-04-01 DIAGNOSIS — Z7982 Long term (current) use of aspirin: Secondary | ICD-10-CM | POA: Insufficient documentation

## 2021-04-01 DIAGNOSIS — N2581 Secondary hyperparathyroidism of renal origin: Secondary | ICD-10-CM | POA: Diagnosis not present

## 2021-04-01 DIAGNOSIS — D509 Iron deficiency anemia, unspecified: Secondary | ICD-10-CM | POA: Diagnosis not present

## 2021-04-01 DIAGNOSIS — Z79899 Other long term (current) drug therapy: Secondary | ICD-10-CM | POA: Insufficient documentation

## 2021-04-01 DIAGNOSIS — N186 End stage renal disease: Secondary | ICD-10-CM | POA: Insufficient documentation

## 2021-04-01 DIAGNOSIS — Z4931 Encounter for adequacy testing for hemodialysis: Secondary | ICD-10-CM | POA: Diagnosis not present

## 2021-04-01 DIAGNOSIS — E440 Moderate protein-calorie malnutrition: Secondary | ICD-10-CM | POA: Diagnosis not present

## 2021-04-01 LAB — CBC
HCT: 33.5 % — ABNORMAL LOW (ref 39.0–52.0)
Hemoglobin: 11.9 g/dL — ABNORMAL LOW (ref 13.0–17.0)
MCH: 33 pg (ref 26.0–34.0)
MCHC: 35.5 g/dL (ref 30.0–36.0)
MCV: 92.8 fL (ref 80.0–100.0)
Platelets: 164 10*3/uL (ref 150–400)
RBC: 3.61 MIL/uL — ABNORMAL LOW (ref 4.22–5.81)
RDW: 13.7 % (ref 11.5–15.5)
WBC: 6.6 10*3/uL (ref 4.0–10.5)
nRBC: 0 % (ref 0.0–0.2)

## 2021-04-01 LAB — BASIC METABOLIC PANEL
Anion gap: 27 — ABNORMAL HIGH (ref 5–15)
BUN: 100 mg/dL — ABNORMAL HIGH (ref 6–20)
CO2: 25 mmol/L (ref 22–32)
Calcium: 9 mg/dL (ref 8.9–10.3)
Chloride: 80 mmol/L — ABNORMAL LOW (ref 98–111)
Creatinine, Ser: 17.05 mg/dL — ABNORMAL HIGH (ref 0.61–1.24)
GFR, Estimated: 3 mL/min — ABNORMAL LOW (ref >60)
Glucose, Bld: 105 mg/dL — ABNORMAL HIGH (ref 70–99)
Potassium: 3.8 mmol/L (ref 3.5–5.1)
Sodium: 132 mmol/L — ABNORMAL LOW (ref 135–145)

## 2021-04-01 LAB — TROPONIN I (HIGH SENSITIVITY)
Troponin I (High Sensitivity): 75 ng/L — ABNORMAL HIGH (ref <18)
Troponin I (High Sensitivity): 64 ng/L — ABNORMAL HIGH (ref <18)

## 2021-04-01 IMAGING — CR DG CHEST 2V
2 series · 2 of 2 positions shown · non-contrast
Comparison: CTA chest abdomen and pelvis [DATE] and earlier.

CLINICAL DATA: 55-year-old male with chest pain this morning.

EXAM:
CHEST - 2 VIEW

[chest pa]
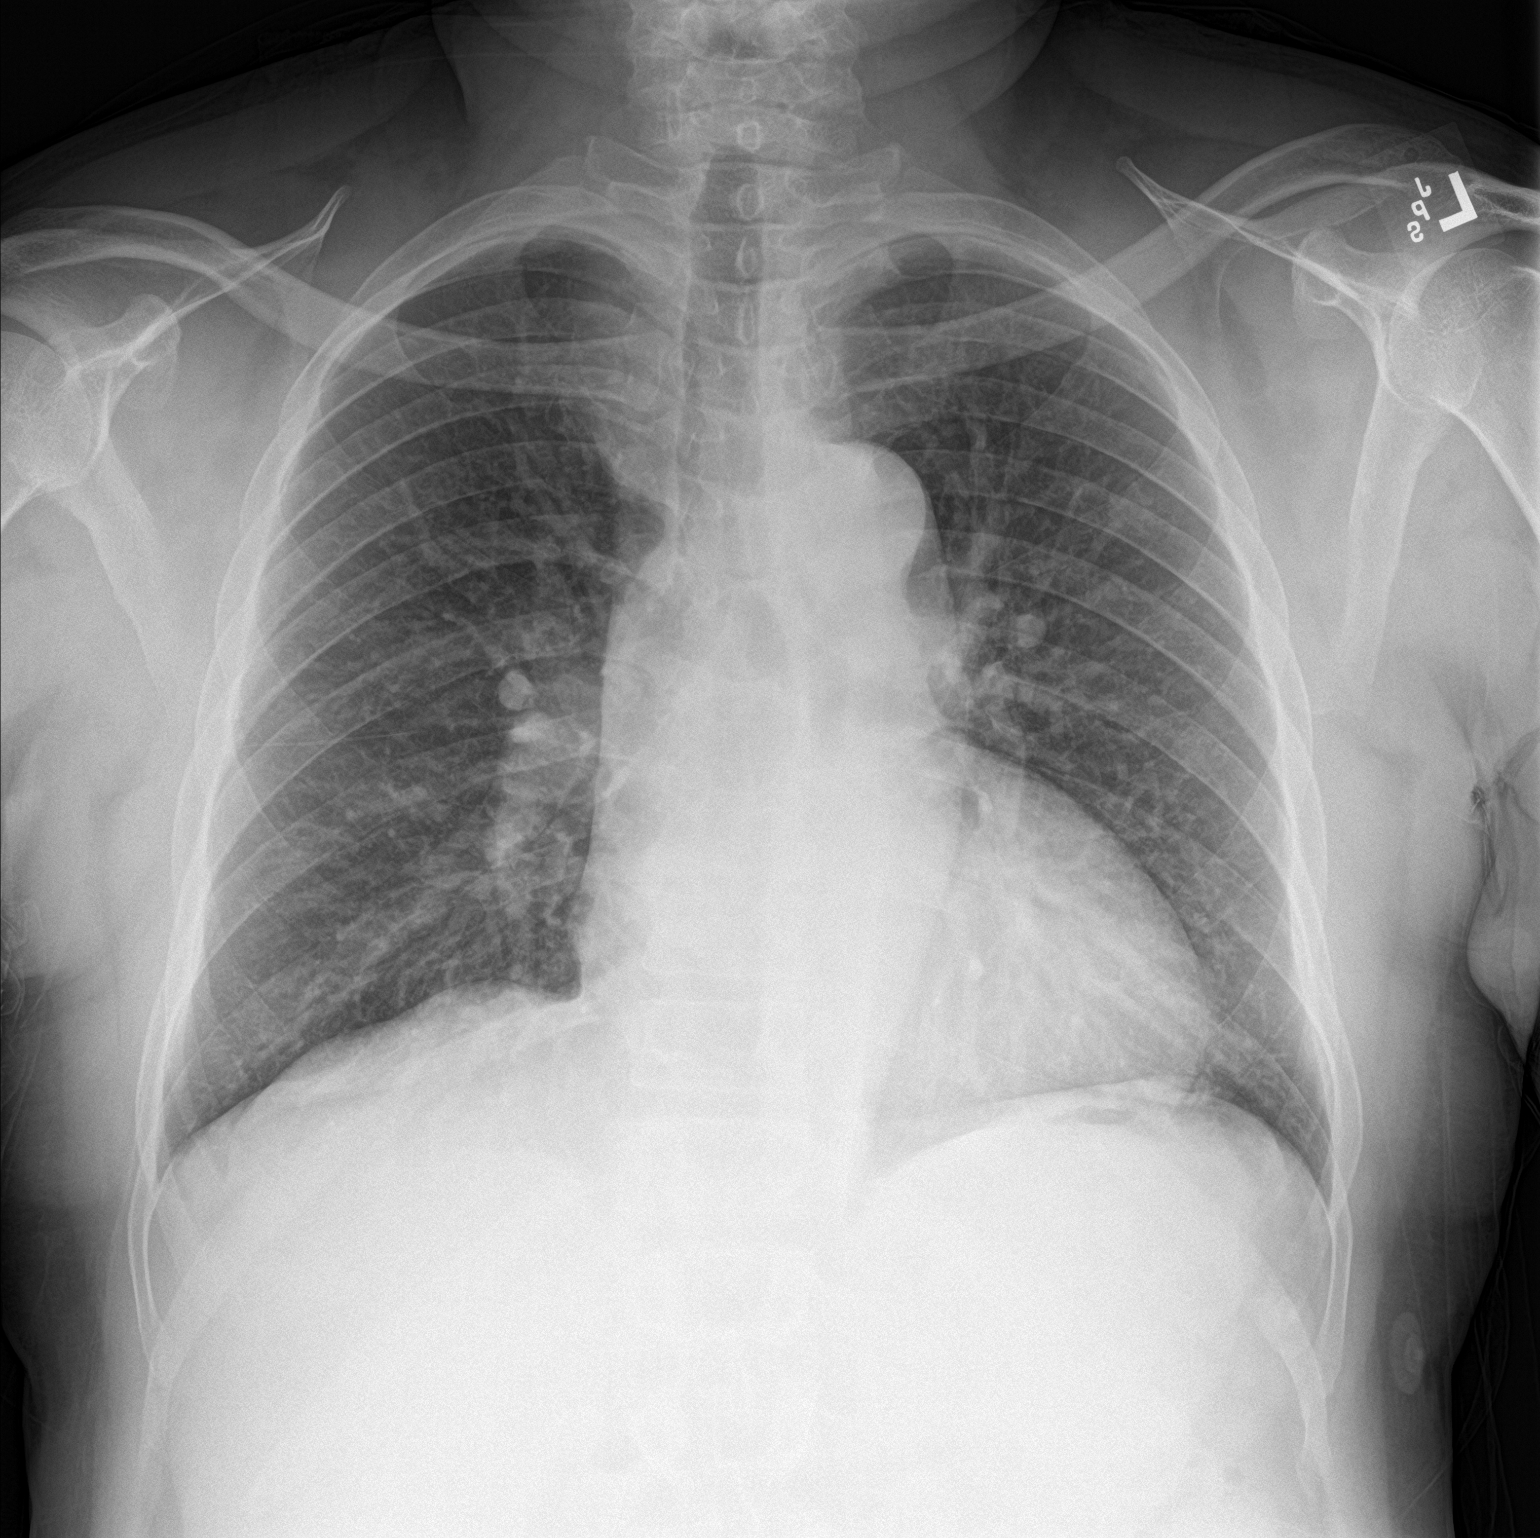

[chest lat]
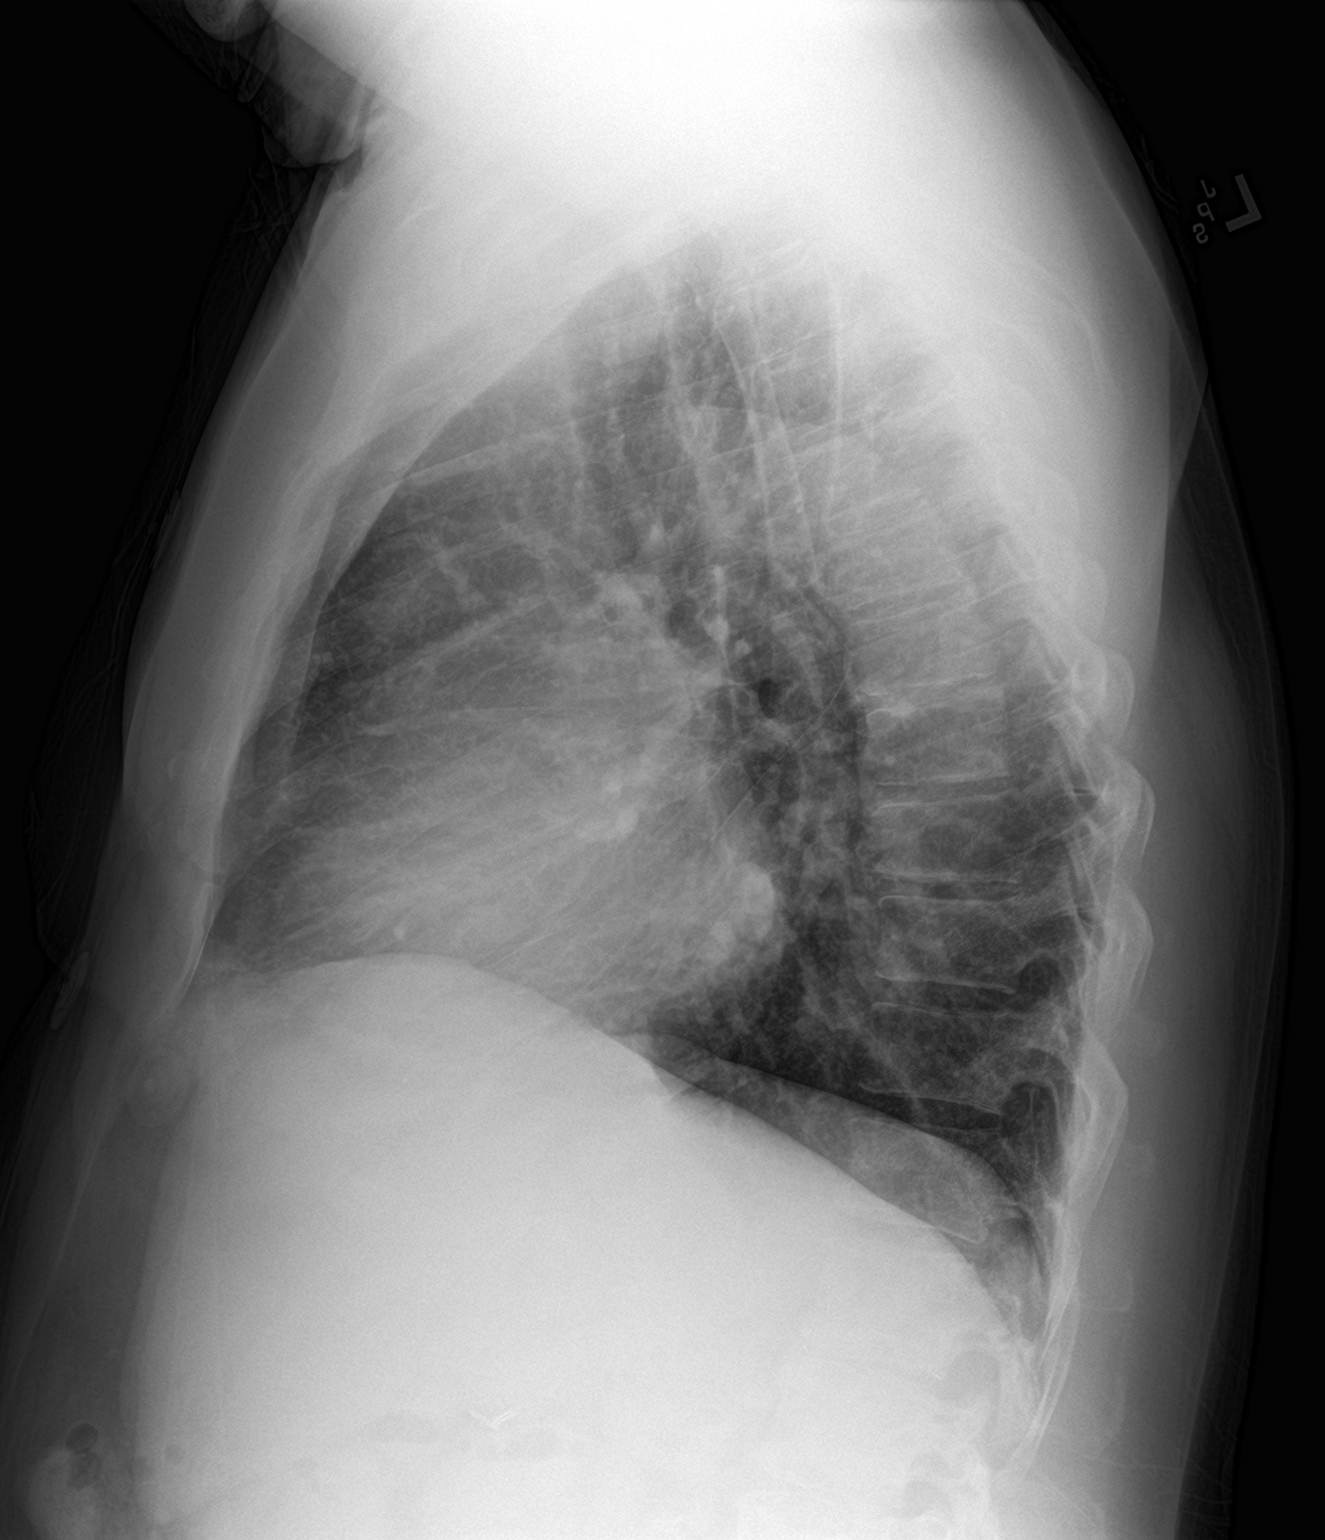

[2 of 2 positions shown; findings below may reference images not displayed]

FINDINGS: Mild cardiomegaly and mediastinal contours are stable. Visualized
tracheal air column is within normal limits. Lung markings appear
stable since [U8], and the lungs appear clear with no pneumothorax
or pleural effusion.

No acute osseous abnormality identified. Stable cholecystectomy
clips. Negative visible bowel gas pattern.
IMPRESSION: Stable mild cardiomegaly.  No acute cardiopulmonary abnormality.

## 2021-04-01 NOTE — ED Provider Notes (Signed)
[facility] EMERGENCY DEPARTMENT Provider Note   CSN: [id number] Arrival date & time: 04/01/21  5573     History Chief Complaint  Patient presents with  . Chest Pain  . Hiccups    Rad Gramling Bevins is a 55 y.o. male.  HPI      Remijio Holleran Mikes is a 55 y.o. male, with a history of anemia, asthma, CHF, CKD, nephrectomy, HIV, hyperlipidemia, HTN, mitral regurgitation, presenting to the ED with Hiccups beginning yesterday morning around 8AM.  He states he has intermittently experienced hiccups for the last couple months.  He was prescribed Thorazine by his PCP to be taken as needed 3 times daily.  He has been taking the medication once daily with last dose yesterday.  He notes mild chest discomfort beginning after the hiccups, present with the hiccups, central chest, nonradiating.  He is a peritoneal dialysis patient and he does this at home during the day Mondays, Tuesdays, Thursdays, and Fridays with last session Friday, June 3.  Last HIV quant and CD4 count sampled September 2021, undetectable and 1175, respectively.  Denies fever/chills, cough, shortness of breath, abdominal pain, neck/back pain, neurologic deficits, syncope, diaphoresis, or any other complaints.  Past Medical History:  Diagnosis Date  . Anemia   . Aortic atherosclerosis ([place])   . Asthma   . Chronic diastolic CHF (congestive heart failure) ([place]) 01/06/2017   Echo 7/16 [facility] in [place], [name]) Mild AI, mild LAE, mild concentric LVH, EF 55, normal wall motion, mild to moderate MR, mild PI, RVSP 55 // Echo 10/09/16 ([facility]):  Moderate LVH, grade 2 diastolic dysfunction, mild MR, moderate LAE   . CKD (chronic kidney disease)    per pt, waiting on a kidney transplant in near future.  . Esophagitis   . Gout   . Hepatitis    Hep B  . History of nuclear stress test    a. Nuc study 7/16: no scar or ischemia, EF 42 // b. Nuc study 12/17: EF 48, ?small apical ischemia, Low Risk  . HIV (human  immunodeficiency virus infection) ([place])   . HLD (hyperlipidemia)   . Hypertension   . Hypertensive heart disease with CHF (congestive heart failure) ([place]) 10/09/2016  . Mitral regurgitation   . OSA (obstructive sleep apnea)   . Post-operative nausea and vomiting    1 time as a child  . Sleep apnea    uses c-pap  . Wears glasses   . Wears partial dentures     Patient Active Problem List   Diagnosis Date Noted  . SVT (supraventricular tachycardia) ([place]) 01/18/2021  . Lower abdominal pain 06/26/2020  . Pulsatile tinnitus of right ear 11/21/2019  . Kidney failure 11/21/2019  . Acute diastolic CHF (congestive heart failure) ([place]) 11/21/2019  . Chills (without fever) 11/07/2019  . Hypercalcemia 10/17/2019  . Dependence on renal dialysis ([place]) 06/06/2019  . Coagulation defect, unspecified ([place]) 05/23/2019  . Iron deficiency anemia, unspecified 05/16/2019  . Anemia in other chronic diseases classified elsewhere 05/13/2019  . Arteriovenous fistula, acquired ([place]) 05/13/2019  . Body mass index (BMI) 28.0-28.9, adult 05/13/2019  . Crohn's disease of small intestine without complications ([place]) 22/11/5425  . Encounter for screening for respiratory tuberculosis 05/13/2019  . Gout, unspecified 05/13/2019  . Nausea 05/13/2019  . Other fatigue 05/13/2019  . Secondary hyperparathyroidism of renal origin ([place]) 05/13/2019  . End stage renal disease ([place]) 05/13/2019  . Chronic diastolic (congestive) heart failure ([place]) 05/13/2019  . Chronic kidney disease, unspecified 05/13/2019  .  Obstructive sleep apnea 05/13/2019  . Discomfort of right ear 02/18/2019  . Hypokalemia 06/13/2018  . Abnormal CT scan, small bowel   . Perennial allergic rhinitis with a predominant nonallergic component 02/09/2018  . Allergic conjunctivitis 02/09/2018  . Mild intermittent asthma 02/09/2018  . Atherosclerosis 12/09/2017  . Hypothyroidism 11/19/2017  . Mixed hyperlipidemia 10/13/2017  . Personal history of gout  10/13/2017  . S/P cholecystectomy 10/13/2017  . Congestive heart failure with LV diastolic dysfunction, NYHA class 1 ([place]) 10/13/2017  . Hypothyroidism (acquired) 10/13/2017  . 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 SOB (shortness of breath) 06/07/2017  . Arthritis, gouty 01/27/2017  . Chronic diastolic CHF (congestive heart failure) ([place]) 01/06/2017  . OSA (obstructive sleep apnea) 01/06/2017  . Abnormal electrocardiogram (ECG) (EKG) 10/31/2016  . Chest pain 10/09/2016  . HIV disease ([place]) 10/09/2016  . Hypertensive heart disease with CHF (congestive heart failure) ([place]) 10/09/2016  . Left-sided low back pain without sciatica 05/29/2015  . NYHA class 2 heart failure with preserved ejection fraction ([place]) 05/15/2015  . PAH (pulmonary artery hypertension) ([place]) 05/08/2015  . Severe mitral regurgitation 05/04/2015  . Prolonged Q-T interval on ECG 05/04/2015    Past Surgical History:  Procedure Laterality Date  . AV FISTULA PLACEMENT Left 07/02/2018   Procedure: Creation of Left arm BRACHIOBASILIC ARTERIOVENOUS  FISTULA;  Surgeon: Marty Heck, MD;  Location: [place];  Service: Vascular;  Laterality: Left;  . BASCILIC VEIN TRANSPOSITION Left 08/27/2018   Procedure: Left arm BRACHIOBASILIC VEIN TRANSPOSITION SECOND STAGE;  Surgeon: Marty Heck, MD;  Location: [place];  Service: Vascular;  Laterality: Left;  . BUBBLE STUDY  09/03/2020   Procedure: BUBBLE STUDY;  Surgeon: Fay Records, MD;  Location: [place];  Service: Cardiovascular;;  . CHOLECYSTECTOMY    . COLONOSCOPY W/ BIOPSIES AND POLYPECTOMY    . GIVENS CAPSULE STUDY N/A 03/01/2018   Procedure: GIVENS CAPSULE STUDY;  Surgeon: Jerene Bears, MD;  Location: [place];  Service: Gastroenterology;  Laterality: N/A;  . HERNIA REPAIR     As baby  . MULTIPLE TOOTH EXTRACTIONS    . NOSE SURGERY    . RENAL BIOPSY    . TEE WITHOUT CARDIOVERSION N/A 09/03/2020   Procedure: TRANSESOPHAGEAL ECHOCARDIOGRAM (TEE);  Surgeon:  Fay Records, MD;  Location: [facility] ENDOSCOPY;  Service: Cardiovascular;  Laterality: N/A;       Family History  Problem Relation Age of Onset  . Heart failure Mother   . Heart attack Maternal Grandmother 53  . Hypertension Sister   . Multiple sclerosis Sister   . Hypertension Brother   . Hypertension Sister   . Scoliosis Other   . Allergic rhinitis Neg Hx   . Angioedema Neg Hx   . Asthma Neg Hx   . Eczema Neg Hx   . Immunodeficiency Neg Hx   . Urticaria Neg Hx   . Colon cancer Neg Hx   . Pancreatic cancer Neg Hx   . Esophageal cancer Neg Hx     Social History   Tobacco Use  . Smoking status: Former Smoker    Types: Cigarettes    Quit date: 2000    Years since quitting: 22.4  . Smokeless tobacco: Never Used  Vaping Use  . Vaping Use: Never used  Substance Use Topics  . Alcohol use: Yes    Comment: once a month   . Drug use: No    Home Medications Prior to Admission medications   Medication Sig Start Date  End Date Taking? Authorizing Provider  ACETAMINOPHEN EXTRA STRENGTH 500 MG tablet Take by mouth. 02/20/21   [provider]  albuterol (VENTOLIN HFA) 108 (90 Base) MCG/ACT inhaler INHALE 2 PUFFS INTO THE LUNGS EVERY 4 HOURS AS NEEDED FOR WHEEZING OR SHORTNESS OF BREATH 02/18/21   Valentina Shaggy, MD  allopurinol (ZYLOPRIM) 100 MG tablet TAKE 1 TABLET(100 MG) BY MOUTH TWICE DAILY Patient taking differently: Take 100 mg by mouth 2 (two) times daily. 01/07/21   [name], FNP  amLODipine (NORVASC) 10 MG tablet Take 1 tablet (10 mg total) by mouth daily. 10/31/16   [name], FNP  aspirin EC 81 MG tablet Take 1 tablet (81 mg total) by mouth daily. Swallow whole. 03/06/21   Richardson Dopp T, PA-C  azelastine (ASTELIN) 0.1 % nasal spray Place 2 sprays into both nostrils 2 (two) times daily as needed for rhinitis. Use in each nostril as directed 05/24/20   Valentina Shaggy, MD  BIKTARVY 50-200-25 MG TABS tablet TAKE 1 TABLET BY MOUTH DAILY 02/20/21    Campbell Riches, MD  calcitRIOL (ROCALTROL) 0.5 MCG capsule Take 0.5 mcg by mouth 3 (three) times a week. 02/06/21   [provider]  carvedilol (COREG) 25 MG tablet TAKE 1 TABLET BY MOUTH TWICE DAILY WITH FOOD Patient taking differently: Take 25 mg by mouth 2 (two) times daily with a meal. 12/31/20   [name], MD  chlorproMAZINE (THORAZINE) 10 MG tablet Take 1 tablet (10 mg total) by mouth 3 (three) times daily as needed for hiccoughs. 02/26/21   [name], FNP  clonazePAM (KLONOPIN) 0.5 MG tablet Take 1 tablet (0.5 mg total) by mouth at bedtime. 01/02/21   Olalere, Cicero Duck A, MD  docusate sodium (COLACE) 100 MG capsule Take 100 mg by mouth 2 (two) times daily. 02/20/21   [provider]  doxycycline (VIBRA-TABS) 100 MG tablet Take 1 tablet (100 mg total) by mouth 2 (two) times daily. 10/29/20   Burtis Junes, NP  famotidine (PEPCID) 20 MG tablet TAKE 1 TABLET(20 MG) BY MOUTH TWICE DAILY 03/27/21   [name], MD  FLOVENT HFA 110 MCG/ACT inhaler INHALE 2 PUFFS INTO THE LUNGS TWICE DAILY 03/05/21   Valentina Shaggy, MD  gabapentin (NEURONTIN) 300 MG capsule TAKE 1 CAPSULE(300 MG) BY MOUTH THREE TIMES DAILY Patient taking differently: Take 300 mg by mouth 3 (three) times daily. 10/09/20   [name], FNP  hydrALAZINE (APRESOLINE) 100 MG tablet Take 1 tablet (100 mg total) by mouth 3 (three) times daily. 10/24/20   [name], MD  hyoscyamine (LEVSIN SL) 0.125 MG SL tablet Place 1-2 tablets (0.125-0.25 mg total) under the tongue every 8 (eight) hours as needed (lower abdominal pain, spasm). 08/05/19   Pyrtle, Lajuan Lines, MD  ipratropium (ATROVENT) 0.06 % nasal spray USE 2 SPRAYS IN EACH NOSTRIL 2 TO 3 TIMES DAILY AS NEEDED Patient taking differently: Place 2 sprays into both nostrils 3 (three) times daily as needed for rhinitis. 09/03/20   Valentina Shaggy, MD  iron sucrose (VENOFER) 20 MG/ML injection Inject into the vein. 07/12/20   [provider]  lactulose (CHRONULAC) 10 GM/15ML solution Take 20 g by mouth daily as needed for moderate constipation.  07/08/20   [provider]  levothyroxine (SYNTHROID) 25 MCG tablet TAKE 1 TABLET BY MOUTH DAILY BEFORE BREAKFAST Patient taking differently: Take 25 mcg by mouth daily before breakfast. 02/05/21   [name], FNP  lidocaine-prilocaine (EMLA) cream Apply  1 application topically See admin instructions. Apply to access site 1-2 hours before dialysis 06/01/19   [provider]  lubiprostone (AMITIZA) 24 MCG capsule TAKE 1 CAPSULE(24 MCG) BY MOUTH TWICE DAILY WITH A MEAL 02/27/21   Pyrtle, Lajuan Lines, MD  methocarbamol (ROBAXIN) 500 MG tablet Take 1 tablet (500 mg total) by mouth every 8 (eight) hours as needed for muscle spasms. 02/11/21   Maudie Flakes, MD  Methoxy PEG-Epoetin Beta (MIRCERA IJ) Inject into the skin. 11/08/20   [provider]  multivitamin (RENA-VIT) TABS tablet Take 1 tablet by mouth at bedtime. 01/10/21   [provider]  mupirocin ointment (BACTROBAN) 2 % Apply 1 application topically daily as needed (apply to port after dialysis).  04/24/20   [provider]  Olopatadine HCl (PAZEO) 0.7 % SOLN Place 1 drop into both eyes daily. Patient taking differently: Place 1 drop into both eyes daily as needed (itchy eyes). 05/24/20   Valentina Shaggy, MD  Oxycodone HCl 10 MG TABS Take 10 mg by mouth 4 (four) times daily as needed. 02/20/21   [provider]  PEG-KCl-NaCl-NaSulf-Na Asc-C (PLENVU) 140 g SOLR 1 kit for colon prep 02/12/21   Noralyn Pick, NP  PRESCRIPTION MEDICATION Take 1 tablet by mouth daily. Multi vitamin from dialysis    [provider]  rOPINIRole (REQUIP) 0.5 MG tablet Take 0.5 mg by mouth at bedtime. 02/05/21   [provider]  rosuvastatin (CRESTOR) 10 MG tablet Take 1 tablet (10 mg total) by mouth daily. 03/06/21 06/04/21  Richardson Dopp T, PA-C  senna (SENOKOT) 8.6 MG TABS tablet Take 2  tablets by mouth daily. 02/20/21   [provider]  sevelamer carbonate (RENVELA) 800 MG tablet Take 1,600 mg by mouth 3 (three) times daily. 11/09/20   [provider]  Spacer/Aero-Holding Josiah Lobo DEVI 1 Device by Does not apply route 2 (two) times a day. 03/01/19   Bobbitt, Sedalia Muta, MD  Vitamin D, Ergocalciferol, (DRISDOL) 1.25 MG (50000 UNIT) CAPS capsule TAKE 1 CAPSULE BY MOUTH EVERY 7 DAYS Patient taking differently: Take 50,000 Units by mouth every Tuesday. 02/05/21   [name], FNP    Allergies    Ace inhibitors and Lisinopril  Review of Systems   Review of Systems  Constitutional: Negative for chills, diaphoresis and fever.  Respiratory: Negative for cough and shortness of breath.        Hiccups  Gastrointestinal: Negative for abdominal pain, diarrhea, nausea and vomiting.  Musculoskeletal: Negative for back pain and neck pain.  Neurological: Negative for dizziness, syncope and weakness.  All other systems reviewed and are negative.   Physical Exam Updated Vital Signs BP (!) 141/94 (BP Location: Right Arm)   Pulse 88   Temp 98.8 F (37.1 C)   Resp 18   SpO2 99%   Physical Exam Vitals and nursing note reviewed.  Constitutional:      General: He is not in acute distress.    Appearance: He is well-developed. He is not diaphoretic.  HENT:     Head: Normocephalic and atraumatic.     Mouth/Throat:     Mouth: Mucous membranes are moist.     Pharynx: Oropharynx is clear.  Eyes:     Conjunctiva/sclera: Conjunctivae normal.  Cardiovascular:     Rate and Rhythm: Normal rate and regular rhythm.     Pulses: Normal pulses.          Radial pulses are 2+ on the right side and 2+ on  the left side.       Posterior tibial pulses are 2+ on the right side and 2+ on the left side.     Heart sounds: Normal heart sounds.     Comments: Tactile temperature in the extremities appropriate and equal bilaterally. Pulmonary:     Effort: Pulmonary effort is  normal. No respiratory distress.     Breath sounds: Normal breath sounds.     Comments: No increased work of breathing.  Speaks in full sentences without difficulty. Only 1 or 2 hiccups noted during interview. Abdominal:     Palpations: Abdomen is soft.     Tenderness: There is no abdominal tenderness. There is no guarding.  Musculoskeletal:     Cervical back: Neck supple.     Right lower leg: No edema.     Left lower leg: No edema.  Lymphadenopathy:     Cervical: No cervical adenopathy.  Skin:    General: Skin is warm and dry.  Neurological:     Mental Status: He is alert.  Psychiatric:        Mood and Affect: Mood and affect normal.        Speech: Speech normal.        Behavior: Behavior normal.     ED Results / Procedures / Treatments   Labs (all labs ordered are listed, but only abnormal results are displayed) Labs Reviewed  BASIC METABOLIC PANEL - Abnormal; Notable for the following components:      Result Value   Sodium 132 (*)    Chloride 80 (*)    Glucose, Bld 105 (*)    BUN 100 (*)    Creatinine, Ser 17.05 (*)    GFR, Estimated 3 (*)    Anion gap 27 (*)    All other components within normal limits  CBC - Abnormal; Notable for the following components:   RBC 3.61 (*)    Hemoglobin 11.9 (*)    HCT 33.5 (*)    All other components within normal limits  TROPONIN I (HIGH SENSITIVITY) - Abnormal; Notable for the following components:   Troponin I (High Sensitivity) 75 (*)    All other components within normal limits  TROPONIN I (HIGH SENSITIVITY) - Abnormal; Notable for the following components:   Troponin I (High Sensitivity) 64 (*)    All other components within normal limits    EKG EKG Interpretation  Date/Time:  Monday April 01 2021 06:21:26 EDT Ventricular Rate:  86 PR Interval:  164 QRS Duration: 92 QT Interval:  426 QTC Calculation: 509 R Axis:   56 Text Interpretation: Normal sinus rhythm Prolonged QT Confirmed by Lajean Saver [phone]) on  04/01/2021 8:32:13 AM   Radiology DG Chest 2 View  Result Date: 04/01/2021 CLINICAL DATA:  55 year old male with chest pain this morning. EXAM: CHEST - 2 VIEW COMPARISON:  CTA chest abdomen and pelvis 02/11/2021 and earlier. FINDINGS: Mild cardiomegaly and mediastinal contours are stable. Visualized tracheal air column is within normal limits. Lung markings appear stable since 2018, and the lungs appear clear with no pneumothorax or pleural effusion. No acute osseous abnormality identified. Stable cholecystectomy clips. Negative visible bowel gas pattern. IMPRESSION: Stable mild cardiomegaly.  No acute cardiopulmonary abnormality. Electronically Signed   By: Genevie Ann M.D.   On: 04/01/2021 06:57    Procedures Procedures   Medications Ordered in ED Medications - No data to display  ED Course  I have reviewed the triage vital signs and the nursing notes.  Pertinent labs &  imaging results that were available during my care of the patient were reviewed by me and considered in my medical decision making (see chart for details).  Clinical Course as of 04/01/21 1152  Mon Apr 01, 2021  0720 Troponin I (High Sensitivity)(!): 75 Previously noted to be in the 60s and 70s previously. [SJ]  0847 Creatinine(!): 17.05 Patient is due for dialysis today and has plans to complete this process. [SJ]    Clinical Course User Index [SJ] Kloie Whiting, Helane Gunther, PA-C   MDM Rules/Calculators/A&P                          Patient presents with hiccups beginning yesterday. Patient is nontoxic appearing, afebrile, not tachycardic, not tachypneic, not hypotensive, maintains excellent SPO2 on room air, and is in no apparent distress.   I have reviewed the patient's chart to obtain more information.   I reviewed and interpreted the patient's labs and radiological studies. Patient has plans for repeat lab draws today before and after dialysis.  No acute abnormalities on chest x-ray. HEART score is 3.  EKG without evidence  of acute ischemia or pathologic/symptomatic arrhythmia.  Delta troponins consistent with previous values. Wells criteria score is 0, indicating low risk for PE.   Dissection was considered, but thought less likely base on: History and description of the pain are not suggestive, patient is not ill-appearing, lack of risk factors, equal bilateral pulses, lack of neurologic deficits, no widened mediastinum on chest x-ray.  The patient was given instructions for home care as well as return precautions. Patient voices understanding of these instructions, accepts the plan, and is comfortable with discharge.   Findings and plan of care discussed with attending physician, Lajean Saver, MD.   Vitals:   04/01/21 1000 04/01/21 1030 04/01/21 1045 04/01/21 1100  BP: 126/85 121/81  122/81  Pulse: 87 83 77 86  Resp: 16 17 17  (!) 21  Temp:      TempSrc:      SpO2: 94% 97% 96% 98%     Final Clinical Impression(s) / ED Diagnoses Final diagnoses:  Hiccoughs    Rx / DC Orders ED Discharge Orders    None       Layla Maw 04/01/21 1156    Lajean Saver, MD 04/01/21 1723

## 2021-04-01 NOTE — ED Triage Notes (Signed)
Pt reports hiccups for "months" and no relief despite receiving medications from his doctor.  Pt reports having the hiccups for two straight days and this morning he began to have substernal chest pain.  Denies n/v/d.  Does home dialysis M/T/Thur/Friday.

## 2021-04-01 NOTE — Discharge Instructions (Addendum)
Follow-up with the gastroenterologist regarding the hiccups. Have your electrolytes rechecked, as planned. Should you have recurrence of your chest discomfort, please follow-up with cardiology. Be sure to schedule a follow-up appointment with infectious disease.

## 2021-04-01 NOTE — ED Notes (Signed)
RN reviewed discharge instructions w/ pt. Follow up care and pain management reviewed. Pt had no further questions

## 2021-04-02 ENCOUNTER — Telehealth: Payer: Self-pay | Admitting: Internal Medicine

## 2021-04-02 DIAGNOSIS — E876 Hypokalemia: Secondary | ICD-10-CM | POA: Diagnosis not present

## 2021-04-02 DIAGNOSIS — D631 Anemia in chronic kidney disease: Secondary | ICD-10-CM | POA: Diagnosis not present

## 2021-04-02 DIAGNOSIS — E440 Moderate protein-calorie malnutrition: Secondary | ICD-10-CM | POA: Diagnosis not present

## 2021-04-02 DIAGNOSIS — Z4931 Encounter for adequacy testing for hemodialysis: Secondary | ICD-10-CM | POA: Diagnosis not present

## 2021-04-02 DIAGNOSIS — K7689 Other specified diseases of liver: Secondary | ICD-10-CM | POA: Diagnosis not present

## 2021-04-02 DIAGNOSIS — E8770 Fluid overload, unspecified: Secondary | ICD-10-CM | POA: Diagnosis not present

## 2021-04-02 DIAGNOSIS — R82998 Other abnormal findings in urine: Secondary | ICD-10-CM | POA: Diagnosis not present

## 2021-04-02 DIAGNOSIS — Z79899 Other long term (current) drug therapy: Secondary | ICD-10-CM | POA: Diagnosis not present

## 2021-04-02 DIAGNOSIS — Z992 Dependence on renal dialysis: Secondary | ICD-10-CM | POA: Diagnosis not present

## 2021-04-02 DIAGNOSIS — N2581 Secondary hyperparathyroidism of renal origin: Secondary | ICD-10-CM | POA: Diagnosis not present

## 2021-04-02 DIAGNOSIS — N186 End stage renal disease: Secondary | ICD-10-CM | POA: Diagnosis not present

## 2021-04-02 DIAGNOSIS — D509 Iron deficiency anemia, unspecified: Secondary | ICD-10-CM | POA: Diagnosis not present

## 2021-04-02 NOTE — Telephone Encounter (Signed)
Pt reports he is having hiccups all the time. Reports he was seen in the ER yesterday and told to call Dr. Hilarie Fredrickson. Pt scheduled to see Amy Esterwood PA 04/04/21 at 9am. Pt aware of appt.

## 2021-04-03 DIAGNOSIS — K7689 Other specified diseases of liver: Secondary | ICD-10-CM | POA: Diagnosis not present

## 2021-04-03 DIAGNOSIS — E876 Hypokalemia: Secondary | ICD-10-CM | POA: Diagnosis not present

## 2021-04-03 DIAGNOSIS — N2581 Secondary hyperparathyroidism of renal origin: Secondary | ICD-10-CM | POA: Diagnosis not present

## 2021-04-03 DIAGNOSIS — R82998 Other abnormal findings in urine: Secondary | ICD-10-CM | POA: Diagnosis not present

## 2021-04-03 DIAGNOSIS — E440 Moderate protein-calorie malnutrition: Secondary | ICD-10-CM | POA: Diagnosis not present

## 2021-04-03 DIAGNOSIS — N186 End stage renal disease: Secondary | ICD-10-CM | POA: Diagnosis not present

## 2021-04-03 DIAGNOSIS — E8770 Fluid overload, unspecified: Secondary | ICD-10-CM | POA: Diagnosis not present

## 2021-04-03 DIAGNOSIS — Z992 Dependence on renal dialysis: Secondary | ICD-10-CM | POA: Diagnosis not present

## 2021-04-03 DIAGNOSIS — D631 Anemia in chronic kidney disease: Secondary | ICD-10-CM | POA: Diagnosis not present

## 2021-04-03 DIAGNOSIS — Z4931 Encounter for adequacy testing for hemodialysis: Secondary | ICD-10-CM | POA: Diagnosis not present

## 2021-04-03 DIAGNOSIS — D509 Iron deficiency anemia, unspecified: Secondary | ICD-10-CM | POA: Diagnosis not present

## 2021-04-03 DIAGNOSIS — Z79899 Other long term (current) drug therapy: Secondary | ICD-10-CM | POA: Diagnosis not present

## 2021-04-04 ENCOUNTER — Ambulatory Visit (INDEPENDENT_AMBULATORY_CARE_PROVIDER_SITE_OTHER): Payer: Medicare Other | Admitting: Physician Assistant

## 2021-04-04 ENCOUNTER — Other Ambulatory Visit (INDEPENDENT_AMBULATORY_CARE_PROVIDER_SITE_OTHER): Payer: Medicare Other

## 2021-04-04 ENCOUNTER — Encounter: Payer: Self-pay | Admitting: Physician Assistant

## 2021-04-04 VITALS — BP 124/80 | HR 83 | Ht 70.0 in | Wt 202.0 lb

## 2021-04-04 DIAGNOSIS — K7689 Other specified diseases of liver: Secondary | ICD-10-CM | POA: Diagnosis not present

## 2021-04-04 DIAGNOSIS — E440 Moderate protein-calorie malnutrition: Secondary | ICD-10-CM | POA: Diagnosis not present

## 2021-04-04 DIAGNOSIS — K219 Gastro-esophageal reflux disease without esophagitis: Secondary | ICD-10-CM

## 2021-04-04 DIAGNOSIS — R066 Hiccough: Secondary | ICD-10-CM | POA: Diagnosis not present

## 2021-04-04 DIAGNOSIS — N186 End stage renal disease: Secondary | ICD-10-CM | POA: Diagnosis not present

## 2021-04-04 DIAGNOSIS — Z992 Dependence on renal dialysis: Secondary | ICD-10-CM | POA: Diagnosis not present

## 2021-04-04 DIAGNOSIS — R12 Heartburn: Secondary | ICD-10-CM

## 2021-04-04 DIAGNOSIS — E876 Hypokalemia: Secondary | ICD-10-CM | POA: Diagnosis not present

## 2021-04-04 DIAGNOSIS — Z4931 Encounter for adequacy testing for hemodialysis: Secondary | ICD-10-CM | POA: Diagnosis not present

## 2021-04-04 DIAGNOSIS — Z79899 Other long term (current) drug therapy: Secondary | ICD-10-CM | POA: Diagnosis not present

## 2021-04-04 DIAGNOSIS — D509 Iron deficiency anemia, unspecified: Secondary | ICD-10-CM | POA: Diagnosis not present

## 2021-04-04 DIAGNOSIS — R82998 Other abnormal findings in urine: Secondary | ICD-10-CM | POA: Diagnosis not present

## 2021-04-04 DIAGNOSIS — D631 Anemia in chronic kidney disease: Secondary | ICD-10-CM | POA: Diagnosis not present

## 2021-04-04 DIAGNOSIS — N2581 Secondary hyperparathyroidism of renal origin: Secondary | ICD-10-CM | POA: Diagnosis not present

## 2021-04-04 DIAGNOSIS — E8770 Fluid overload, unspecified: Secondary | ICD-10-CM | POA: Diagnosis not present

## 2021-04-04 LAB — BASIC METABOLIC PANEL
BUN: 43 mg/dL — ABNORMAL HIGH (ref 6–23)
CO2: 35 mEq/L — ABNORMAL HIGH (ref 19–32)
Calcium: 10.3 mg/dL (ref 8.4–10.5)
Chloride: 91 mEq/L — ABNORMAL LOW (ref 96–112)
Creatinine, Ser: 8.2 mg/dL (ref 0.40–1.50)
GFR: 6.81 mL/min — CL (ref >60.00)
Glucose, Bld: 89 mg/dL (ref 70–99)
Potassium: 3.7 mEq/L (ref 3.5–5.1)
Sodium: 139 mEq/L (ref 135–145)

## 2021-04-04 MED ORDER — PANTOPRAZOLE SODIUM 40 MG PO TBEC: 40.0000 mg | DELAYED_RELEASE_TABLET | Freq: Every day | ORAL | 1 refills | Status: DC

## 2021-04-04 MED ORDER — PANTOPRAZOLE SODIUM 40 MG PO TBEC: 40.0000 mg | DELAYED_RELEASE_TABLET | Freq: Two times a day (BID) | ORAL | 1 refills | Status: DC

## 2021-04-04 NOTE — Progress Notes (Signed)
Subjective:    Patient ID: Albert Eaton, male    DOB: 02-Sep-1966, 55 y.o.   MRN: [id number]  HPI  Albert Eaton is a pleasant 55 year old African-American male established with Dr. Hilarie Fredrickson, who comes in today with complaint of progressive intractable hiccups onset about 2 months ago now to the point of being constant.  Along with this he has had an increase in heartburn and indigestion particularly after eating.  He says he is able to eat, and is not having any complaints of dysphagia or odynophagia though he feels his esophagus is irritated.  He had been seen by his primary and started on a trial of Thorazine which she is taking 3 times daily and says this is not helping.  He has been on Pepcid 20 mg twice daily.. Patient has multiple serious medical problems including end-stage renal disease for which she is on home dialysis currently.  He underwent a partial left nephrectomy in April 2022 in [place], and is currently getting established for renal transplant in [place].  He has history of congestive heart failure with preserved EF, obstructive sleep apnea, mitral regurgitation, HIV for which she is on Biktarvy and chronic hep B.  He is followed by infectious disease and followed by hepatology Dr. Estil Daft at [facility]. He also has history of adenomatous colon polyps.  He had undergone colonoscopy in February 2019, he had 5 polyps removed the largest 10 mm and by biopsy all were tubular adenomas with no high-grade dysplasia.  He is due for follow-up colonoscopy which is currently scheduled for July with Dr. Hilarie Fredrickson. Patient had an ER visit on 04/01/2021 due to the hiccups, labs at that time with potassium 3.8/BUN 100/creatinine 17.0 On 02/11/2021 potassium 3.3/BUN 69/creatinine 11.28  Review of Systems Pertinent positive and negative review of systems were noted in the above HPI section.  All other review of systems was otherwise negative.   Outpatient Encounter Medications as of 04/04/2021  Medication  Sig   ACETAMINOPHEN EXTRA STRENGTH 500 MG tablet Take by mouth.   albuterol (VENTOLIN HFA) 108 (90 Base) MCG/ACT inhaler INHALE 2 PUFFS INTO THE LUNGS EVERY 4 HOURS AS NEEDED FOR WHEEZING OR SHORTNESS OF BREATH   allopurinol (ZYLOPRIM) 100 MG tablet TAKE 1 TABLET(100 MG) BY MOUTH TWICE DAILY (Patient taking differently: Take 100 mg by mouth 2 (two) times daily.)   amLODipine (NORVASC) 10 MG tablet Take 1 tablet (10 mg total) by mouth daily.   aspirin EC 81 MG tablet Take 1 tablet (81 mg total) by mouth daily. Swallow whole.   azelastine (ASTELIN) 0.1 % nasal spray Place 2 sprays into both nostrils 2 (two) times daily as needed for rhinitis. Use in each nostril as directed   BIKTARVY 50-200-25 MG TABS tablet TAKE 1 TABLET BY MOUTH DAILY   calcitRIOL (ROCALTROL) 0.5 MCG capsule Take 0.5 mcg by mouth 3 (three) times a week.   carvedilol (COREG) 25 MG tablet TAKE 1 TABLET BY MOUTH TWICE DAILY WITH FOOD (Patient taking differently: Take 25 mg by mouth 2 (two) times daily with a meal.)   chlorproMAZINE (THORAZINE) 10 MG tablet Take 1 tablet (10 mg total) by mouth 3 (three) times daily as needed for hiccoughs.   clonazePAM (KLONOPIN) 0.5 MG tablet Take 1 tablet (0.5 mg total) by mouth at bedtime.   docusate sodium (COLACE) 100 MG capsule Take 100 mg by mouth 2 (two) times daily.   doxycycline (VIBRA-TABS) 100 MG tablet Take 1 tablet (100 mg total) by mouth 2 (two) times  daily.   famotidine (PEPCID) 20 MG tablet TAKE 1 TABLET(20 MG) BY MOUTH TWICE DAILY   FLOVENT HFA 110 MCG/ACT inhaler INHALE 2 PUFFS INTO THE LUNGS TWICE DAILY   gabapentin (NEURONTIN) 300 MG capsule TAKE 1 CAPSULE(300 MG) BY MOUTH THREE TIMES DAILY (Patient taking differently: Take 300 mg by mouth 3 (three) times daily.)   hydrALAZINE (APRESOLINE) 100 MG tablet Take 1 tablet (100 mg total) by mouth 3 (three) times daily.   hyoscyamine (LEVSIN SL) 0.125 MG SL tablet Place 1-2 tablets (0.125-0.25 mg total) under the tongue every 8 (eight)  hours as needed (lower abdominal pain, spasm).   ipratropium (ATROVENT) 0.06 % nasal spray USE 2 SPRAYS IN EACH NOSTRIL 2 TO 3 TIMES DAILY AS NEEDED (Patient taking differently: Place 2 sprays into both nostrils 3 (three) times daily as needed for rhinitis.)   iron sucrose (VENOFER) 20 MG/ML injection Inject into the vein.   lactulose (CHRONULAC) 10 GM/15ML solution Take 20 g by mouth daily as needed for moderate constipation.    levothyroxine (SYNTHROID) 25 MCG tablet TAKE 1 TABLET BY MOUTH DAILY BEFORE BREAKFAST (Patient taking differently: Take 25 mcg by mouth daily before breakfast.)   lidocaine-prilocaine (EMLA) cream Apply 1 application topically See admin instructions. Apply to access site 1-2 hours before dialysis   lubiprostone (AMITIZA) 24 MCG capsule TAKE 1 CAPSULE(24 MCG) BY MOUTH TWICE DAILY WITH A MEAL   methocarbamol (ROBAXIN) 500 MG tablet Take 1 tablet (500 mg total) by mouth every 8 (eight) hours as needed for muscle spasms.   Methoxy PEG-Epoetin Beta (MIRCERA IJ) Inject into the skin.   multivitamin (RENA-VIT) TABS tablet Take 1 tablet by mouth at bedtime.   mupirocin ointment (BACTROBAN) 2 % Apply 1 application topically daily as needed (apply to port after dialysis).    Olopatadine HCl (PAZEO) 0.7 % SOLN Place 1 drop into both eyes daily. (Patient taking differently: Place 1 drop into both eyes daily as needed (itchy eyes).)   pantoprazole (PROTONIX) 40 MG tablet Take 1 tablet (40 mg total) by mouth 2 (two) times daily before a meal.   pantoprazole (PROTONIX) 40 MG tablet Take 1 tablet (40 mg total) by mouth daily.   PEG-KCl-NaCl-NaSulf-Na Asc-C (PLENVU) 140 g SOLR 1 kit for colon prep   PRESCRIPTION MEDICATION Take 1 tablet by mouth daily. Multi vitamin from dialysis   rOPINIRole (REQUIP) 0.5 MG tablet Take 0.5 mg by mouth at bedtime.   rosuvastatin (CRESTOR) 10 MG tablet Take 1 tablet (10 mg total) by mouth daily.   senna (SENOKOT) 8.6 MG TABS tablet Take 2 tablets by mouth  daily.   sevelamer carbonate (RENVELA) 800 MG tablet Take 1,600 mg by mouth 3 (three) times daily.   Spacer/Aero-Holding Chambers DEVI 1 Device by Does not apply route 2 (two) times a day.   Vitamin D, Ergocalciferol, (DRISDOL) 1.25 MG (50000 UNIT) CAPS capsule TAKE 1 CAPSULE BY MOUTH EVERY 7 DAYS (Patient taking differently: Take 50,000 Units by mouth every Tuesday.)   [DISCONTINUED] Oxycodone HCl 10 MG TABS Take 10 mg by mouth 4 (four) times daily as needed. (Patient not taking: Reported on 04/04/2021)   No facility-administered encounter medications on file as of 04/04/2021.   Allergies  Allergen Reactions   Ace Inhibitors Cough and Other (See Comments)   Lisinopril Cough   Patient Active Problem List   Diagnosis Date Noted   SVT (supraventricular tachycardia) (HCC) 01/18/2021   Lower abdominal pain 06/26/2020   Pulsatile tinnitus of right ear 11/21/2019  Kidney failure 35/46/5681   Acute diastolic CHF (congestive heart failure) ([place]) 11/21/2019   Chills (without fever) 11/07/2019   Hypercalcemia 10/17/2019   Dependence on renal dialysis ([place]) 06/06/2019   Coagulation defect, unspecified ([place]) 05/23/2019   Iron deficiency anemia, unspecified 05/16/2019   Anemia in other chronic diseases classified elsewhere 05/13/2019   Arteriovenous fistula, acquired ([place]) 05/13/2019   Body mass index (BMI) 28.0-28.9, adult 05/13/2019   Crohn's disease of small intestine without complications ([place]) 27/51/7001   Encounter for screening for respiratory tuberculosis 05/13/2019   Gout, unspecified 05/13/2019   Nausea 05/13/2019   Other fatigue 05/13/2019   Secondary hyperparathyroidism of renal origin ([place]) 05/13/2019   End stage renal disease ([place]) 05/13/2019   Chronic diastolic (congestive) heart failure ([place]) 05/13/2019   Chronic kidney disease, unspecified 05/13/2019   Obstructive sleep apnea 05/13/2019   Discomfort of right ear 02/18/2019   Hypokalemia 06/13/2018   Abnormal CT scan, small  bowel    Perennial allergic rhinitis with a predominant nonallergic component 02/09/2018   Allergic conjunctivitis 02/09/2018   Mild intermittent asthma 02/09/2018   Atherosclerosis 12/09/2017   Hypothyroidism 11/19/2017   Mixed hyperlipidemia 10/13/2017   Personal history of gout 10/13/2017   S/P cholecystectomy 10/13/2017   Congestive heart failure with LV diastolic dysfunction, NYHA class 1 ([place]) 10/13/2017   Hypothyroidism (acquired) 10/13/2017   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/31/2017   SOB (shortness of breath) 06/07/2017   Arthritis, gouty 01/27/2017   Chronic diastolic CHF (congestive heart failure) ([place]) 01/06/2017   OSA (obstructive sleep apnea) 01/06/2017   Abnormal electrocardiogram (ECG) (EKG) 10/31/2016   Chest pain 10/09/2016   HIV disease ([place]) 10/09/2016   Hypertensive heart disease with CHF (congestive heart failure) ([place]) 10/09/2016   Left-sided low back pain without sciatica 05/29/2015   NYHA class 2 heart failure with preserved ejection fraction ([place]) 05/15/2015   PAH (pulmonary artery hypertension) ([place]) 05/08/2015   Severe mitral regurgitation 05/04/2015   Prolonged Q-T interval on ECG 05/04/2015   Social History   Socioeconomic History   Marital status: Single    Spouse name: Not on file   Number of children: Not on file   Years of education: Not on file   Highest education level: Not on file  Occupational History   Not on file  Tobacco Use   Smoking status: Former    Pack years: 0.00    Types: Cigarettes    Quit date: 2000    Years since quitting: 22.4   Smokeless tobacco: Never  Vaping Use   Vaping Use: Never used  Substance and Sexual Activity   Alcohol use: Yes    Comment: once a month    Drug use: No   Sexual activity: Not Currently    Partners: Male    Birth control/protection: Condom    Comment: declined  condoms 07/11/20  Other Topics Concern   Not on file  Social History Narrative   Not on file   Social Determinants of Health    Financial Resource Strain: Not on file  Food Insecurity: No Food Insecurity   Worried About Charity fundraiser in the Last Year: Never true   Arboriculturist in the Last Year: Never true  Transportation Needs: No Transportation Needs   Lack of Transportation (Medical): No   Lack of Transportation (Non-Medical): No  Physical Activity: Not on file  Stress: Not on file  Social Connections: Not on file  Intimate Partner Violence: Not on file  Mr. Sermersheim family history includes Heart attack (age of onset: 28) in his maternal grandmother; Heart failure in his mother; Hypertension in his brother, sister, and sister; Multiple sclerosis in his sister; Scoliosis in an other family member.      Objective:    Vitals:   04/04/21 0850  BP: 124/80  Pulse: 83  SpO2: 98%    Physical Exam Well-developed well-nourished  AA male in no acute distress.  Persistently hiccuping, appears uncomfortable  Weight, 202 BMI 28.9  HEENT; nontraumatic normocephalic, EOMI, PE R LA, sclera anicteric. Oropharynx; not examined today Neck; supple, no JVD Cardiovascular; regular rate and rhythm with S1-S2, no murmur rub or gallop Pulmonary; Clear bilaterally Abdomen; soft, nontender, nondistended, no palpable mass or hepatosplenomegaly, bowel sounds are active Rectal; not done today Skin; benign exam, no jaundice rash or appreciable lesions Extremities; no clubbing cyanosis or edema skin warm and dry Neuro/Psych; alert and oriented x4, grossly nonfocal mood and affect appropriate        Assessment & Plan:   #60 55 year old African-American male with multiple comorbidities including end-stage renal disease, on home hemodialysis who underwent partial left nephrectomy April 2022 now presenting with progressive hiccups over the past 2 months now to the point of being constant/intractable.  This is been associated with increased heartburn  Etiology for the intractable hiccups is not clear, he certainly may  have esophagitis, or an infectious esophagitis.  Also concerned with marked elevation in creatinine that progressive renal failure may be playing a role.  #2 HIV on Biktarvy #3 history of chronic hep B #4 history of adenomatous colon polyps-due for follow-up colonoscopy which is currently scheduled July 2022 Dr. Hilarie Fredrickson #5 obstructive sleep apnea 6.  Congestive heart failure preserved EF, mitral regurg  Plan; stop Pepcid and switch to Protonix 40 mg p.o. twice daily AC breakfast and AC dinner Had intended to prescribe baclofen however contra indication comes up given renal failure For now we will leave on Thorazine 10 mg 3 times daily. Repeat be met today Will contact patient's nephrologist regarding progressive rise in creatinine, and have also asked patient to call his nephrologist Dr. Madelon Lips. Patient will be scheduled for EGD with Dr. Henrene Pastor, tomorrow 04/05/2021.  Further recommendations pending findings at EGD.    Cuyler Vandyken Genia Harold PA-C 04/04/2021   Cc: [name], FNP

## 2021-04-04 NOTE — Progress Notes (Signed)
Addendum: Reviewed and agree with assessment and management plan. Kourtlyn Charlet M, MD  

## 2021-04-04 NOTE — Patient Instructions (Addendum)
If you are age 55 or younger, your body mass index should be between 19-25. Your Body mass index is 28.98 kg/m. If this is out of the aformentioned range listed, please consider follow up with your Primary Care Provider.   __________________________________________________________  The [facility] providers would like to encourage you to use [facility] to communicate with providers for non-urgent requests or questions.  Due to long hold times on the telephone, sending your provider a message by [facility] may be a faster and more efficient way to get a response.  Please allow 48 business hours for a response.  Please remember that this is for non-urgent requests.   Contact your Nephrologist today to discuss your current Creatine level at 17  You have been scheduled for an endoscopy. Please follow written instructions given to you at your visit today. If you use inhalers (even only as needed), please bring them with you on the day of your procedure.  Stop your Pepcid/Famotidine START Pantoprazole 40 mg 1 tablet twice daily for 1 month, then reduce to 1 tablet every morning.  Stay on the Thorazine until we have completed your Endoscopy.  Follow up pending at this time.  Thank you for entrusting me with your care and choosing [facility].  Amy Esterwood, PA-C

## 2021-04-05 ENCOUNTER — Encounter: Payer: Self-pay | Admitting: Internal Medicine

## 2021-04-05 ENCOUNTER — Other Ambulatory Visit: Payer: Self-pay

## 2021-04-05 ENCOUNTER — Ambulatory Visit (AMBULATORY_SURGERY_CENTER): Payer: Medicare Other | Admitting: Internal Medicine

## 2021-04-05 VITALS — BP 141/83 | HR 84 | Temp 98.9°F | Resp 14 | Ht 70.0 in | Wt 202.0 lb

## 2021-04-05 DIAGNOSIS — K449 Diaphragmatic hernia without obstruction or gangrene: Secondary | ICD-10-CM | POA: Diagnosis not present

## 2021-04-05 DIAGNOSIS — R066 Hiccough: Secondary | ICD-10-CM

## 2021-04-05 DIAGNOSIS — R12 Heartburn: Secondary | ICD-10-CM | POA: Diagnosis not present

## 2021-04-05 DIAGNOSIS — K219 Gastro-esophageal reflux disease without esophagitis: Secondary | ICD-10-CM | POA: Diagnosis not present

## 2021-04-05 HISTORY — PX: UPPER GI ENDOSCOPY: SHX6162

## 2021-04-05 MED ORDER — SODIUM CHLORIDE 0.9 % IV SOLN: 500.0000 mL | Freq: Once | INTRAVENOUS | Status: DC

## 2021-04-05 NOTE — Progress Notes (Signed)
pt tolerated well. VSS. awake and to recovery. Report given to RN.  Bite block left insitu to reovery.

## 2021-04-05 NOTE — Op Note (Signed)
[facility] Patient Name: Albert Eaton Procedure Date: 04/05/2021 11:43 AM MRN: [id number] Endoscopist: Docia Chuck. Henrene Pastor , MD Age: 55 Referring MD:  Date of Birth: 27-Dec-1965 Gender: Male Account #: [id number] Procedure:                Upper GI endoscopy Indications:              Esophageal reflux; intractable hiccups Medicines:                Monitored Anesthesia Care Procedure:                Pre-Anesthesia Assessment:                           - Prior to the procedure, a History and Physical                            was performed, and patient medications and                            allergies were reviewed. The patient's tolerance of                            previous anesthesia was also reviewed. The risks                            and benefits of the procedure and the sedation                            options and risks were discussed with the patient.                            All questions were answered, and informed consent                            was obtained. Prior Anticoagulants: The patient has                            taken no previous anticoagulant or antiplatelet                            agents. ASA Grade Assessment: III - A patient with                            severe systemic disease. After reviewing the risks                            and benefits, the patient was deemed in                            satisfactory condition to undergo the procedure.                           After obtaining informed consent, the endoscope was  passed under direct vision. Throughout the                            procedure, the patient's blood pressure, pulse, and                            oxygen saturations were monitored continuously. The                            Endoscope was introduced through the mouth, and                            advanced to the second part of duodenum. The upper                            GI endoscopy was  accomplished without difficulty.                            The patient tolerated the procedure well. Scope In: Scope Out: Findings:                 The esophagus was normal.                           The stomach was normal, save sliding hiatal hernia.                           The examined duodenum was normal.                           The cardia and gastric fundus were normal on                            retroflexion. Complications:            No immediate complications. Estimated Blood Loss:     Estimated blood loss: none. Impression:               1. GERD                           2. Normal EGD save small hiatal hernia                           3. Intractable hiccups. Recommendation:           - Patient has a contact number available for                            emergencies. The signs and symptoms of potential                            delayed complications were discussed with the                            patient. Return to normal activities tomorrow.  Written discharge instructions were provided to the                            patient.                           - Resume previous diet.                           - Continue present medications, including recently                            started pantoprazole 40 mg twice daily. Hopefully                            aggressive treatment for GERD will result in                            resolution of chronic hiccups                           - Office follow-up with Amy Esterwood PA-C (with                            Dr. Hilarie Fredrickson) in about 4 weeks Docia Chuck. Henrene Pastor, MD 04/05/2021 12:06:21 PM This report has been signed electronically.

## 2021-04-05 NOTE — Patient Instructions (Signed)
Continue present medications, including recently started Pantoprazole 40 mg twice daily. Follow up with Nicoletta Ba PA-C with Dr. Hilarie Fredrickson in about 4 weeks. YOU HAD AN ENDOSCOPIC PROCEDURE TODAY AT [facility]:   Refer to the procedure report that was given to you for any specific questions about what was found during the examination.  If the procedure report does not answer your questions, please call your gastroenterologist to clarify.  If you requested that your care partner not be given the details of your procedure findings, then the procedure report has been included in a sealed envelope for you to review at your convenience later.  YOU SHOULD EXPECT: Some feelings of bloating in the abdomen. Passage of more gas than usual.  Walking can help get rid of the air that was put into your GI tract during the procedure and reduce the bloating. If you had a lower endoscopy (such as a colonoscopy or flexible sigmoidoscopy) you may notice spotting of blood in your stool or on the toilet paper. If you underwent a bowel prep for your procedure, you may not have a normal bowel movement for a few days.  Please Note:  You might notice some irritation and congestion in your nose or some drainage.  This is from the oxygen used during your procedure.  There is no need for concern and it should clear up in a day or so.  SYMPTOMS TO REPORT IMMEDIATELY:   Following upper endoscopy (EGD)  Vomiting of blood or coffee ground material  New chest pain or pain under the shoulder blades  Painful or persistently difficult swallowing  New shortness of breath  Fever of 100F or higher  Black, tarry-looking stools  For urgent or emergent issues, a gastroenterologist can be reached at any hour by calling [phone]. Do not use MyChart messaging for urgent concerns.    DIET:  We do recommend a small meal at first, but then you may proceed to your regular diet.  Drink plenty of fluids but you should  avoid alcoholic beverages for 24 hours.  ACTIVITY:  You should plan to take it easy for the rest of today and you should NOT DRIVE or use heavy machinery until tomorrow (because of the sedation medicines used during the test).    FOLLOW UP: Our staff will call the number listed on your records 48-72 hours following your procedure to check on you and address any questions or concerns that you may have regarding the information given to you following your procedure. If we do not reach you, we will leave a message.  We will attempt to reach you two times.  During this call, we will ask if you have developed any symptoms of COVID 19. If you develop any symptoms (ie: fever, flu-like symptoms, shortness of breath, cough etc.) before then, please call [phone].  If you test positive for Covid 19 in the 2 weeks post procedure, please call and report this information to [place].    If any biopsies were taken you will be contacted by phone or by letter within the next 1-3 weeks.  Please call us at [phone] if you have not heard about the biopsies in 3 weeks.    SIGNATURES/CONFIDENTIALITY: You and/or your care partner have signed paperwork which will be entered into your electronic medical record.  These signatures attest to the fact that that the information above on your After Visit Summary has been reviewed and is understood.  Full responsibility of the confidentiality  of this discharge information lies with you and/or your care-partner.

## 2021-04-08 ENCOUNTER — Other Ambulatory Visit: Payer: Self-pay | Admitting: Family Medicine

## 2021-04-08 ENCOUNTER — Telehealth: Payer: Self-pay

## 2021-04-08 DIAGNOSIS — C642 Malignant neoplasm of left kidney, except renal pelvis: Secondary | ICD-10-CM | POA: Diagnosis not present

## 2021-04-08 DIAGNOSIS — Z0000 Encounter for general adult medical examination without abnormal findings: Secondary | ICD-10-CM | POA: Diagnosis not present

## 2021-04-08 NOTE — Telephone Encounter (Signed)
carvedilol

## 2021-04-09 ENCOUNTER — Telehealth: Payer: Self-pay | Admitting: *Deleted

## 2021-04-09 ENCOUNTER — Telehealth: Payer: Self-pay

## 2021-04-09 DIAGNOSIS — N186 End stage renal disease: Secondary | ICD-10-CM | POA: Diagnosis not present

## 2021-04-09 DIAGNOSIS — Z992 Dependence on renal dialysis: Secondary | ICD-10-CM | POA: Diagnosis not present

## 2021-04-09 DIAGNOSIS — I129 Hypertensive chronic kidney disease with stage 1 through stage 4 chronic kidney disease, or unspecified chronic kidney disease: Secondary | ICD-10-CM | POA: Diagnosis not present

## 2021-04-09 NOTE — Telephone Encounter (Signed)
Unable to leave message mailbox full.

## 2021-04-09 NOTE — Telephone Encounter (Signed)
  Follow up Call-  Call back number 04/05/2021  Post procedure Call Back phone  # [phone]  Permission to leave phone message Yes  Some recent data might be hidden     Patient questions:  Do you have a fever, pain , or abdominal swelling? No. Pain Score  0 *  Have you tolerated food without any problems? Yes.    Have you been able to return to your normal activities? Yes.    Do you have any questions about your discharge instructions: Diet   No. Medications  No. Follow up visit  No.  Do you have questions or concerns about your Care? No.  Actions: * If pain score is 4 or above: No action needed, pain <4.  Have you developed a fever since your procedure? no  2.   Have you had an respiratory symptoms (SOB or cough) since your procedure? no  3.   Have you tested positive for COVID 19 since your procedure no  4.   Have you had any family members/close contacts diagnosed with the COVID 19 since your procedure?  no   If yes to any of these questions please route to Joylene John, RN and Joella Prince, RN

## 2021-04-15 DIAGNOSIS — N186 End stage renal disease: Secondary | ICD-10-CM | POA: Diagnosis not present

## 2021-04-15 DIAGNOSIS — E8770 Fluid overload, unspecified: Secondary | ICD-10-CM | POA: Diagnosis not present

## 2021-04-15 DIAGNOSIS — K7689 Other specified diseases of liver: Secondary | ICD-10-CM | POA: Diagnosis not present

## 2021-04-15 DIAGNOSIS — Z992 Dependence on renal dialysis: Secondary | ICD-10-CM | POA: Diagnosis not present

## 2021-04-15 DIAGNOSIS — E440 Moderate protein-calorie malnutrition: Secondary | ICD-10-CM | POA: Diagnosis not present

## 2021-04-15 DIAGNOSIS — R82998 Other abnormal findings in urine: Secondary | ICD-10-CM | POA: Diagnosis not present

## 2021-04-15 DIAGNOSIS — D631 Anemia in chronic kidney disease: Secondary | ICD-10-CM | POA: Diagnosis not present

## 2021-04-15 DIAGNOSIS — N2581 Secondary hyperparathyroidism of renal origin: Secondary | ICD-10-CM | POA: Diagnosis not present

## 2021-04-15 DIAGNOSIS — Z4931 Encounter for adequacy testing for hemodialysis: Secondary | ICD-10-CM | POA: Diagnosis not present

## 2021-04-15 DIAGNOSIS — D509 Iron deficiency anemia, unspecified: Secondary | ICD-10-CM | POA: Diagnosis not present

## 2021-04-15 DIAGNOSIS — Z79899 Other long term (current) drug therapy: Secondary | ICD-10-CM | POA: Diagnosis not present

## 2021-04-15 DIAGNOSIS — E876 Hypokalemia: Secondary | ICD-10-CM | POA: Diagnosis not present

## 2021-04-16 DIAGNOSIS — E8770 Fluid overload, unspecified: Secondary | ICD-10-CM | POA: Diagnosis not present

## 2021-04-16 DIAGNOSIS — Z4931 Encounter for adequacy testing for hemodialysis: Secondary | ICD-10-CM | POA: Diagnosis not present

## 2021-04-16 DIAGNOSIS — N2581 Secondary hyperparathyroidism of renal origin: Secondary | ICD-10-CM | POA: Diagnosis not present

## 2021-04-16 DIAGNOSIS — D509 Iron deficiency anemia, unspecified: Secondary | ICD-10-CM | POA: Diagnosis not present

## 2021-04-16 DIAGNOSIS — Z992 Dependence on renal dialysis: Secondary | ICD-10-CM | POA: Diagnosis not present

## 2021-04-16 DIAGNOSIS — D631 Anemia in chronic kidney disease: Secondary | ICD-10-CM | POA: Diagnosis not present

## 2021-04-16 DIAGNOSIS — K7689 Other specified diseases of liver: Secondary | ICD-10-CM | POA: Diagnosis not present

## 2021-04-16 DIAGNOSIS — E440 Moderate protein-calorie malnutrition: Secondary | ICD-10-CM | POA: Diagnosis not present

## 2021-04-16 DIAGNOSIS — R82998 Other abnormal findings in urine: Secondary | ICD-10-CM | POA: Diagnosis not present

## 2021-04-16 DIAGNOSIS — Z79899 Other long term (current) drug therapy: Secondary | ICD-10-CM | POA: Diagnosis not present

## 2021-04-16 DIAGNOSIS — N186 End stage renal disease: Secondary | ICD-10-CM | POA: Diagnosis not present

## 2021-04-16 DIAGNOSIS — E876 Hypokalemia: Secondary | ICD-10-CM | POA: Diagnosis not present

## 2021-04-17 DIAGNOSIS — N186 End stage renal disease: Secondary | ICD-10-CM | POA: Diagnosis not present

## 2021-04-17 DIAGNOSIS — E8770 Fluid overload, unspecified: Secondary | ICD-10-CM | POA: Diagnosis not present

## 2021-04-17 DIAGNOSIS — Z79899 Other long term (current) drug therapy: Secondary | ICD-10-CM | POA: Diagnosis not present

## 2021-04-17 DIAGNOSIS — D509 Iron deficiency anemia, unspecified: Secondary | ICD-10-CM | POA: Diagnosis not present

## 2021-04-17 DIAGNOSIS — K7689 Other specified diseases of liver: Secondary | ICD-10-CM | POA: Diagnosis not present

## 2021-04-17 DIAGNOSIS — N2581 Secondary hyperparathyroidism of renal origin: Secondary | ICD-10-CM | POA: Diagnosis not present

## 2021-04-17 DIAGNOSIS — E876 Hypokalemia: Secondary | ICD-10-CM | POA: Diagnosis not present

## 2021-04-17 DIAGNOSIS — Z4931 Encounter for adequacy testing for hemodialysis: Secondary | ICD-10-CM | POA: Diagnosis not present

## 2021-04-17 DIAGNOSIS — E440 Moderate protein-calorie malnutrition: Secondary | ICD-10-CM | POA: Diagnosis not present

## 2021-04-17 DIAGNOSIS — Z992 Dependence on renal dialysis: Secondary | ICD-10-CM | POA: Diagnosis not present

## 2021-04-17 DIAGNOSIS — D631 Anemia in chronic kidney disease: Secondary | ICD-10-CM | POA: Diagnosis not present

## 2021-04-17 DIAGNOSIS — R82998 Other abnormal findings in urine: Secondary | ICD-10-CM | POA: Diagnosis not present

## 2021-04-18 DIAGNOSIS — N186 End stage renal disease: Secondary | ICD-10-CM | POA: Diagnosis not present

## 2021-04-18 DIAGNOSIS — R82998 Other abnormal findings in urine: Secondary | ICD-10-CM | POA: Diagnosis not present

## 2021-04-18 DIAGNOSIS — E440 Moderate protein-calorie malnutrition: Secondary | ICD-10-CM | POA: Diagnosis not present

## 2021-04-18 DIAGNOSIS — E876 Hypokalemia: Secondary | ICD-10-CM | POA: Diagnosis not present

## 2021-04-18 DIAGNOSIS — Z4931 Encounter for adequacy testing for hemodialysis: Secondary | ICD-10-CM | POA: Diagnosis not present

## 2021-04-18 DIAGNOSIS — Z992 Dependence on renal dialysis: Secondary | ICD-10-CM | POA: Diagnosis not present

## 2021-04-18 DIAGNOSIS — Z79899 Other long term (current) drug therapy: Secondary | ICD-10-CM | POA: Diagnosis not present

## 2021-04-18 DIAGNOSIS — E8770 Fluid overload, unspecified: Secondary | ICD-10-CM | POA: Diagnosis not present

## 2021-04-18 DIAGNOSIS — K7689 Other specified diseases of liver: Secondary | ICD-10-CM | POA: Diagnosis not present

## 2021-04-18 DIAGNOSIS — D631 Anemia in chronic kidney disease: Secondary | ICD-10-CM | POA: Diagnosis not present

## 2021-04-18 DIAGNOSIS — N2581 Secondary hyperparathyroidism of renal origin: Secondary | ICD-10-CM | POA: Diagnosis not present

## 2021-04-18 DIAGNOSIS — D509 Iron deficiency anemia, unspecified: Secondary | ICD-10-CM | POA: Diagnosis not present

## 2021-04-22 DIAGNOSIS — N186 End stage renal disease: Secondary | ICD-10-CM | POA: Diagnosis not present

## 2021-04-22 DIAGNOSIS — N2581 Secondary hyperparathyroidism of renal origin: Secondary | ICD-10-CM | POA: Diagnosis not present

## 2021-04-22 DIAGNOSIS — Z992 Dependence on renal dialysis: Secondary | ICD-10-CM | POA: Diagnosis not present

## 2021-04-22 DIAGNOSIS — Z4931 Encounter for adequacy testing for hemodialysis: Secondary | ICD-10-CM | POA: Diagnosis not present

## 2021-04-22 DIAGNOSIS — E440 Moderate protein-calorie malnutrition: Secondary | ICD-10-CM | POA: Diagnosis not present

## 2021-04-22 DIAGNOSIS — R82998 Other abnormal findings in urine: Secondary | ICD-10-CM | POA: Diagnosis not present

## 2021-04-22 DIAGNOSIS — K7689 Other specified diseases of liver: Secondary | ICD-10-CM | POA: Diagnosis not present

## 2021-04-22 DIAGNOSIS — E876 Hypokalemia: Secondary | ICD-10-CM | POA: Diagnosis not present

## 2021-04-22 DIAGNOSIS — Z79899 Other long term (current) drug therapy: Secondary | ICD-10-CM | POA: Diagnosis not present

## 2021-04-22 DIAGNOSIS — D631 Anemia in chronic kidney disease: Secondary | ICD-10-CM | POA: Diagnosis not present

## 2021-04-22 DIAGNOSIS — D509 Iron deficiency anemia, unspecified: Secondary | ICD-10-CM | POA: Diagnosis not present

## 2021-04-22 DIAGNOSIS — E8770 Fluid overload, unspecified: Secondary | ICD-10-CM | POA: Diagnosis not present

## 2021-04-23 DIAGNOSIS — E8770 Fluid overload, unspecified: Secondary | ICD-10-CM | POA: Diagnosis not present

## 2021-04-23 DIAGNOSIS — E440 Moderate protein-calorie malnutrition: Secondary | ICD-10-CM | POA: Diagnosis not present

## 2021-04-23 DIAGNOSIS — N2581 Secondary hyperparathyroidism of renal origin: Secondary | ICD-10-CM | POA: Diagnosis not present

## 2021-04-23 DIAGNOSIS — Z79899 Other long term (current) drug therapy: Secondary | ICD-10-CM | POA: Diagnosis not present

## 2021-04-23 DIAGNOSIS — N186 End stage renal disease: Secondary | ICD-10-CM | POA: Diagnosis not present

## 2021-04-23 DIAGNOSIS — E876 Hypokalemia: Secondary | ICD-10-CM | POA: Diagnosis not present

## 2021-04-23 DIAGNOSIS — R82998 Other abnormal findings in urine: Secondary | ICD-10-CM | POA: Diagnosis not present

## 2021-04-23 DIAGNOSIS — K7689 Other specified diseases of liver: Secondary | ICD-10-CM | POA: Diagnosis not present

## 2021-04-23 DIAGNOSIS — D631 Anemia in chronic kidney disease: Secondary | ICD-10-CM | POA: Diagnosis not present

## 2021-04-23 DIAGNOSIS — D509 Iron deficiency anemia, unspecified: Secondary | ICD-10-CM | POA: Diagnosis not present

## 2021-04-23 DIAGNOSIS — Z992 Dependence on renal dialysis: Secondary | ICD-10-CM | POA: Diagnosis not present

## 2021-04-23 DIAGNOSIS — Z4931 Encounter for adequacy testing for hemodialysis: Secondary | ICD-10-CM | POA: Diagnosis not present

## 2021-04-24 ENCOUNTER — Other Ambulatory Visit: Payer: Self-pay | Admitting: Family

## 2021-04-24 DIAGNOSIS — D631 Anemia in chronic kidney disease: Secondary | ICD-10-CM | POA: Diagnosis not present

## 2021-04-24 DIAGNOSIS — K7689 Other specified diseases of liver: Secondary | ICD-10-CM | POA: Diagnosis not present

## 2021-04-24 DIAGNOSIS — Z79899 Other long term (current) drug therapy: Secondary | ICD-10-CM | POA: Diagnosis not present

## 2021-04-24 DIAGNOSIS — E876 Hypokalemia: Secondary | ICD-10-CM | POA: Diagnosis not present

## 2021-04-24 DIAGNOSIS — Z4931 Encounter for adequacy testing for hemodialysis: Secondary | ICD-10-CM | POA: Diagnosis not present

## 2021-04-24 DIAGNOSIS — D509 Iron deficiency anemia, unspecified: Secondary | ICD-10-CM | POA: Diagnosis not present

## 2021-04-24 DIAGNOSIS — E440 Moderate protein-calorie malnutrition: Secondary | ICD-10-CM | POA: Diagnosis not present

## 2021-04-24 DIAGNOSIS — N2581 Secondary hyperparathyroidism of renal origin: Secondary | ICD-10-CM | POA: Diagnosis not present

## 2021-04-24 DIAGNOSIS — N186 End stage renal disease: Secondary | ICD-10-CM | POA: Diagnosis not present

## 2021-04-24 DIAGNOSIS — Z992 Dependence on renal dialysis: Secondary | ICD-10-CM | POA: Diagnosis not present

## 2021-04-24 DIAGNOSIS — E8770 Fluid overload, unspecified: Secondary | ICD-10-CM | POA: Diagnosis not present

## 2021-04-24 DIAGNOSIS — R82998 Other abnormal findings in urine: Secondary | ICD-10-CM | POA: Diagnosis not present

## 2021-04-24 DIAGNOSIS — B20 Human immunodeficiency virus [HIV] disease: Secondary | ICD-10-CM

## 2021-04-25 DIAGNOSIS — Z79899 Other long term (current) drug therapy: Secondary | ICD-10-CM | POA: Diagnosis not present

## 2021-04-25 DIAGNOSIS — R82998 Other abnormal findings in urine: Secondary | ICD-10-CM | POA: Diagnosis not present

## 2021-04-25 DIAGNOSIS — K7689 Other specified diseases of liver: Secondary | ICD-10-CM | POA: Diagnosis not present

## 2021-04-25 DIAGNOSIS — D509 Iron deficiency anemia, unspecified: Secondary | ICD-10-CM | POA: Diagnosis not present

## 2021-04-25 DIAGNOSIS — Z992 Dependence on renal dialysis: Secondary | ICD-10-CM | POA: Diagnosis not present

## 2021-04-25 DIAGNOSIS — N2581 Secondary hyperparathyroidism of renal origin: Secondary | ICD-10-CM | POA: Diagnosis not present

## 2021-04-25 DIAGNOSIS — E876 Hypokalemia: Secondary | ICD-10-CM | POA: Diagnosis not present

## 2021-04-25 DIAGNOSIS — D631 Anemia in chronic kidney disease: Secondary | ICD-10-CM | POA: Diagnosis not present

## 2021-04-25 DIAGNOSIS — E440 Moderate protein-calorie malnutrition: Secondary | ICD-10-CM | POA: Diagnosis not present

## 2021-04-25 DIAGNOSIS — E8770 Fluid overload, unspecified: Secondary | ICD-10-CM | POA: Diagnosis not present

## 2021-04-25 DIAGNOSIS — N186 End stage renal disease: Secondary | ICD-10-CM | POA: Diagnosis not present

## 2021-04-25 DIAGNOSIS — Z4931 Encounter for adequacy testing for hemodialysis: Secondary | ICD-10-CM | POA: Diagnosis not present

## 2021-04-25 DIAGNOSIS — I129 Hypertensive chronic kidney disease with stage 1 through stage 4 chronic kidney disease, or unspecified chronic kidney disease: Secondary | ICD-10-CM | POA: Diagnosis not present

## 2021-04-28 ENCOUNTER — Other Ambulatory Visit: Payer: Self-pay | Admitting: Internal Medicine

## 2021-04-29 DIAGNOSIS — E876 Hypokalemia: Secondary | ICD-10-CM | POA: Diagnosis not present

## 2021-04-29 DIAGNOSIS — R82998 Other abnormal findings in urine: Secondary | ICD-10-CM | POA: Diagnosis not present

## 2021-04-29 DIAGNOSIS — D631 Anemia in chronic kidney disease: Secondary | ICD-10-CM | POA: Diagnosis not present

## 2021-04-29 DIAGNOSIS — Z992 Dependence on renal dialysis: Secondary | ICD-10-CM | POA: Diagnosis not present

## 2021-04-29 DIAGNOSIS — Z79899 Other long term (current) drug therapy: Secondary | ICD-10-CM | POA: Diagnosis not present

## 2021-04-29 DIAGNOSIS — K7689 Other specified diseases of liver: Secondary | ICD-10-CM | POA: Diagnosis not present

## 2021-04-29 DIAGNOSIS — Z4931 Encounter for adequacy testing for hemodialysis: Secondary | ICD-10-CM | POA: Diagnosis not present

## 2021-04-29 DIAGNOSIS — N186 End stage renal disease: Secondary | ICD-10-CM | POA: Diagnosis not present

## 2021-04-29 DIAGNOSIS — E8770 Fluid overload, unspecified: Secondary | ICD-10-CM | POA: Diagnosis not present

## 2021-04-29 DIAGNOSIS — N2589 Other disorders resulting from impaired renal tubular function: Secondary | ICD-10-CM | POA: Diagnosis not present

## 2021-04-29 DIAGNOSIS — N2581 Secondary hyperparathyroidism of renal origin: Secondary | ICD-10-CM | POA: Diagnosis not present

## 2021-04-29 DIAGNOSIS — D509 Iron deficiency anemia, unspecified: Secondary | ICD-10-CM | POA: Diagnosis not present

## 2021-04-30 ENCOUNTER — Other Ambulatory Visit: Payer: Self-pay | Admitting: Family Medicine

## 2021-04-30 DIAGNOSIS — M109 Gout, unspecified: Secondary | ICD-10-CM

## 2021-04-30 DIAGNOSIS — D509 Iron deficiency anemia, unspecified: Secondary | ICD-10-CM | POA: Diagnosis not present

## 2021-04-30 DIAGNOSIS — D631 Anemia in chronic kidney disease: Secondary | ICD-10-CM | POA: Diagnosis not present

## 2021-04-30 DIAGNOSIS — E8770 Fluid overload, unspecified: Secondary | ICD-10-CM | POA: Diagnosis not present

## 2021-04-30 DIAGNOSIS — Z4931 Encounter for adequacy testing for hemodialysis: Secondary | ICD-10-CM | POA: Diagnosis not present

## 2021-04-30 DIAGNOSIS — Z79899 Other long term (current) drug therapy: Secondary | ICD-10-CM | POA: Diagnosis not present

## 2021-04-30 DIAGNOSIS — K7689 Other specified diseases of liver: Secondary | ICD-10-CM | POA: Diagnosis not present

## 2021-04-30 DIAGNOSIS — R82998 Other abnormal findings in urine: Secondary | ICD-10-CM | POA: Diagnosis not present

## 2021-04-30 DIAGNOSIS — N186 End stage renal disease: Secondary | ICD-10-CM | POA: Diagnosis not present

## 2021-04-30 DIAGNOSIS — Z992 Dependence on renal dialysis: Secondary | ICD-10-CM | POA: Diagnosis not present

## 2021-04-30 DIAGNOSIS — E876 Hypokalemia: Secondary | ICD-10-CM | POA: Diagnosis not present

## 2021-04-30 DIAGNOSIS — N2589 Other disorders resulting from impaired renal tubular function: Secondary | ICD-10-CM | POA: Diagnosis not present

## 2021-04-30 DIAGNOSIS — N2581 Secondary hyperparathyroidism of renal origin: Secondary | ICD-10-CM | POA: Diagnosis not present

## 2021-04-30 MED ORDER — ALLOPURINOL 100 MG PO TABS: 100.0000 mg | ORAL_TABLET | Freq: Two times a day (BID) | ORAL | 0 refills | Status: DC

## 2021-05-01 ENCOUNTER — Other Ambulatory Visit: Payer: Medicare Other

## 2021-05-01 ENCOUNTER — Other Ambulatory Visit: Payer: Self-pay

## 2021-05-01 DIAGNOSIS — N186 End stage renal disease: Secondary | ICD-10-CM | POA: Diagnosis not present

## 2021-05-01 DIAGNOSIS — Z992 Dependence on renal dialysis: Secondary | ICD-10-CM | POA: Diagnosis not present

## 2021-05-01 DIAGNOSIS — D509 Iron deficiency anemia, unspecified: Secondary | ICD-10-CM | POA: Diagnosis not present

## 2021-05-01 DIAGNOSIS — E876 Hypokalemia: Secondary | ICD-10-CM | POA: Diagnosis not present

## 2021-05-01 DIAGNOSIS — N2589 Other disorders resulting from impaired renal tubular function: Secondary | ICD-10-CM | POA: Diagnosis not present

## 2021-05-01 DIAGNOSIS — R82998 Other abnormal findings in urine: Secondary | ICD-10-CM | POA: Diagnosis not present

## 2021-05-01 DIAGNOSIS — Z4931 Encounter for adequacy testing for hemodialysis: Secondary | ICD-10-CM | POA: Diagnosis not present

## 2021-05-01 DIAGNOSIS — I471 Supraventricular tachycardia: Secondary | ICD-10-CM | POA: Diagnosis not present

## 2021-05-01 DIAGNOSIS — D631 Anemia in chronic kidney disease: Secondary | ICD-10-CM | POA: Diagnosis not present

## 2021-05-01 DIAGNOSIS — I340 Nonrheumatic mitral (valve) insufficiency: Secondary | ICD-10-CM

## 2021-05-01 DIAGNOSIS — I2510 Atherosclerotic heart disease of native coronary artery without angina pectoris: Secondary | ICD-10-CM | POA: Diagnosis not present

## 2021-05-01 DIAGNOSIS — I5032 Chronic diastolic (congestive) heart failure: Secondary | ICD-10-CM | POA: Diagnosis not present

## 2021-05-01 DIAGNOSIS — E8770 Fluid overload, unspecified: Secondary | ICD-10-CM | POA: Diagnosis not present

## 2021-05-01 DIAGNOSIS — Z79899 Other long term (current) drug therapy: Secondary | ICD-10-CM | POA: Diagnosis not present

## 2021-05-01 DIAGNOSIS — K7689 Other specified diseases of liver: Secondary | ICD-10-CM | POA: Diagnosis not present

## 2021-05-01 DIAGNOSIS — N2581 Secondary hyperparathyroidism of renal origin: Secondary | ICD-10-CM | POA: Diagnosis not present

## 2021-05-01 LAB — HEPATIC FUNCTION PANEL
ALT: 13 IU/L (ref 0–44)
AST: 11 IU/L (ref 0–40)
Albumin: 5.3 g/dL — ABNORMAL HIGH (ref 3.8–4.9)
Alkaline Phosphatase: 96 IU/L (ref 44–121)
Bilirubin Total: 0.4 mg/dL (ref 0.0–1.2)
Bilirubin, Direct: 0.13 mg/dL (ref 0.00–0.40)
Total Protein: 8.2 g/dL (ref 6.0–8.5)

## 2021-05-01 LAB — LIPID PANEL
Chol/HDL Ratio: 3.2 ratio (ref 0.0–5.0)
Cholesterol, Total: 126 mg/dL (ref 100–199)
HDL: 40 mg/dL (ref >39)
LDL Chol Calc (NIH): 60 mg/dL (ref 0–99)
Triglycerides: 152 mg/dL — ABNORMAL HIGH (ref 0–149)
VLDL Cholesterol Cal: 26 mg/dL (ref 5–40)

## 2021-05-02 ENCOUNTER — Ambulatory Visit (AMBULATORY_SURGERY_CENTER): Payer: Medicare Other | Admitting: Internal Medicine

## 2021-05-02 ENCOUNTER — Encounter: Payer: Self-pay | Admitting: Internal Medicine

## 2021-05-02 VITALS — BP 152/74 | HR 79 | Temp 97.3°F | Resp 16 | Ht 70.0 in | Wt 212.0 lb

## 2021-05-02 DIAGNOSIS — Z86010 Personal history of colonic polyps: Secondary | ICD-10-CM

## 2021-05-02 DIAGNOSIS — N186 End stage renal disease: Secondary | ICD-10-CM | POA: Diagnosis not present

## 2021-05-02 MED ORDER — CLOTRIMAZOLE-BETAMETHASONE 1-0.05 % EX CREA: 1.0000 "application " | TOPICAL_CREAM | Freq: Two times a day (BID) | CUTANEOUS | 0 refills | Status: DC

## 2021-05-02 MED ORDER — SODIUM CHLORIDE 0.9 % IV SOLN: 500.0000 mL | Freq: Once | INTRAVENOUS | Status: DC

## 2021-05-02 NOTE — Progress Notes (Signed)
Report to PACU, RN, vss, BBS= Clear.  

## 2021-05-02 NOTE — Op Note (Signed)
[name] Procedure Date: 05/02/2021 9:00 AM MRN: [id number] Endoscopist: Jerene Bears , MD Age: 55 Referring MD:  Date of Birth: 1966-02-25 Gender: Male Account #: [id number] Procedure:                Colonoscopy Indications:              High risk colon cancer surveillance: Personal                            history of multiple adenomas, 5 adenomas (the                            largest 1 cm) removed at last colonoscopy: February                            2019 Medicines:                Monitored Anesthesia Care Procedure:                Pre-Anesthesia Assessment:                           - Prior to the procedure, a History and Physical                            was performed, and patient medications and                            allergies were reviewed. The patient's tolerance of                            previous anesthesia was also reviewed. The risks                            and benefits of the procedure and the sedation                            options and risks were discussed with the patient.                            All questions were answered, and informed consent                            was obtained. Prior Anticoagulants: The patient has                            taken no previous anticoagulant or antiplatelet                            agents. ASA Grade Assessment: III - A patient with                            severe systemic disease. After reviewing the risks  and benefits, the patient was deemed in                            satisfactory condition to undergo the procedure.                           After obtaining informed consent, the colonoscope                            was passed under direct vision. Throughout the                            procedure, the patient's blood pressure, pulse, and                            oxygen saturations were monitored continuously. The                             Olympus PFC-[id number] (#[id number]) Colonoscope was                            introduced through the anus and advanced to the                            cecum, identified by appendiceal orifice and                            ileocecal valve. The colonoscopy was performed                            without difficulty. The patient tolerated the                            procedure well. The quality of the bowel                            preparation was good. The ileocecal valve,                            appendiceal orifice, and rectum were photographed. Scope In: 9:19:24 AM Scope Out: 9:38:21 AM Scope Withdrawal Time: 0 hours 14 minutes 32 seconds  Total Procedure Duration: 0 hours 18 minutes 57 seconds  Findings:                 The digital rectal exam was normal.                           The colon (entire examined portion) appeared normal.                           Internal hemorrhoids were found during                            retroflexion. The hemorrhoids were small. Complications:            No  immediate complications. Estimated Blood Loss:     Estimated blood loss: none. Impression:               - The entire examined colon is normal.                           - Small internal hemorrhoids.                           - No specimens collected. Recommendation:           - Patient has a contact number available for                            emergencies. The signs and symptoms of potential                            delayed complications were discussed with the                            patient. Return to normal activities tomorrow.                            Written discharge instructions were provided to the                            patient.                           - Resume previous diet.                           - Continue present medications.                           - For perianal itching can try Lotrisone cream                            applied to external  perianal skin twice daily x 3-5                            days and over the counter Preparation H suppository                            at bedtime for 3-5 nights as needed. If this                            symptom continues to be troublesome please call my                            office.                           - Repeat colonoscopy in 5 years for surveillance. Jerene Bears, MD 05/02/2021 9:42:21 AM This report has been signed electronically.

## 2021-05-02 NOTE — Patient Instructions (Addendum)
Handout on hemorrhoids given to you today  Use lotrisone cream applied externally twice a day for 3-5 days and if needed can use over the counter preparation h suppository at bedtime for 3-5 nights as needed for symptom relief  YOU HAD AN ENDOSCOPIC PROCEDURE TODAY AT [facility]:   Refer to the procedure report that was given to you for any specific questions about what was found during the examination.  If the procedure report does not answer your questions, please call your gastroenterologist to clarify.  If you requested that your care partner not be given the details of your procedure findings, then the procedure report has been included in a sealed envelope for you to review at your convenience later.  YOU SHOULD EXPECT: Some feelings of bloating in the abdomen. Passage of more gas than usual.  Walking can help get rid of the air that was put into your GI tract during the procedure and reduce the bloating. If you had a lower endoscopy (such as a colonoscopy or flexible sigmoidoscopy) you may notice spotting of blood in your stool or on the toilet paper. If you underwent a bowel prep for your procedure, you may not have a normal bowel movement for a few days.  Please Note:  You might notice some irritation and congestion in your nose or some drainage.  This is from the oxygen used during your procedure.  There is no need for concern and it should clear up in a day or so.  SYMPTOMS TO REPORT IMMEDIATELY:  Following lower endoscopy (colonoscopy or flexible sigmoidoscopy):  Excessive amounts of blood in the stool  Significant tenderness or worsening of abdominal pains  Swelling of the abdomen that is new, acute  Fever of 100F or higher  For urgent or emergent issues, a gastroenterologist can be reached at any hour by calling [phone]. Do not use MyChart messaging for urgent concerns.    DIET:  We do recommend a small meal at first, but then you may proceed to your  regular diet.  Drink plenty of fluids but you should avoid alcoholic beverages for 24 hours.  ACTIVITY:  You should plan to take it easy for the rest of today and you should NOT DRIVE or use heavy machinery until tomorrow (because of the sedation medicines used during the test).    FOLLOW UP: Our staff will call the number listed on your records 48-72 hours following your procedure to check on you and address any questions or concerns that you may have regarding the information given to you following your procedure. If we do not reach you, we will leave a message.  We will attempt to reach you two times.  During this call, we will ask if you have developed any symptoms of COVID 19. If you develop any symptoms (ie: fever, flu-like symptoms, shortness of breath, cough etc.) before then, please call [phone].  If you test positive for Covid 19 in the 2 weeks post procedure, please call and report this information to [place].     SIGNATURES/CONFIDENTIALITY: You and/or your care partner have signed paperwork which will be entered into your electronic medical record.  These signatures attest to the fact that that the information above on your After Visit Summary has been reviewed and is understood.  Full responsibility of the confidentiality of this discharge information lies with you and/or your care-partner.

## 2021-05-02 NOTE — Progress Notes (Signed)
VS-CW

## 2021-05-06 ENCOUNTER — Telehealth: Payer: Self-pay

## 2021-05-06 DIAGNOSIS — R82998 Other abnormal findings in urine: Secondary | ICD-10-CM | POA: Diagnosis not present

## 2021-05-06 DIAGNOSIS — N2589 Other disorders resulting from impaired renal tubular function: Secondary | ICD-10-CM | POA: Diagnosis not present

## 2021-05-06 DIAGNOSIS — Z79899 Other long term (current) drug therapy: Secondary | ICD-10-CM | POA: Diagnosis not present

## 2021-05-06 DIAGNOSIS — Z4931 Encounter for adequacy testing for hemodialysis: Secondary | ICD-10-CM | POA: Diagnosis not present

## 2021-05-06 DIAGNOSIS — K7689 Other specified diseases of liver: Secondary | ICD-10-CM | POA: Diagnosis not present

## 2021-05-06 DIAGNOSIS — N2581 Secondary hyperparathyroidism of renal origin: Secondary | ICD-10-CM | POA: Diagnosis not present

## 2021-05-06 DIAGNOSIS — Z992 Dependence on renal dialysis: Secondary | ICD-10-CM | POA: Diagnosis not present

## 2021-05-06 DIAGNOSIS — D631 Anemia in chronic kidney disease: Secondary | ICD-10-CM | POA: Diagnosis not present

## 2021-05-06 DIAGNOSIS — E876 Hypokalemia: Secondary | ICD-10-CM | POA: Diagnosis not present

## 2021-05-06 DIAGNOSIS — N186 End stage renal disease: Secondary | ICD-10-CM | POA: Diagnosis not present

## 2021-05-06 DIAGNOSIS — E8770 Fluid overload, unspecified: Secondary | ICD-10-CM | POA: Diagnosis not present

## 2021-05-06 DIAGNOSIS — D509 Iron deficiency anemia, unspecified: Secondary | ICD-10-CM | POA: Diagnosis not present

## 2021-05-06 NOTE — Telephone Encounter (Signed)
  Follow up Call-  Call back number 05/02/2021 04/05/2021  Post procedure Call Back phone  # [phone]  Permission to leave phone message Yes Yes  Some recent data might be hidden     Patient questions:  Do you have a fever, pain , or abdominal swelling? No. Pain Score  0 *  Have you tolerated food without any problems? Yes.    Have you been able to return to your normal activities? Yes.    Do you have any questions about your discharge instructions: Diet   No. Medications  No. Follow up visit  No.  Do you have questions or concerns about your Care? No.  Actions: * If pain score is 4 or above: No action needed, pain <4.  Have you developed a fever since your procedure? no  2.   Have you had an respiratory symptoms (SOB or cough) since your procedure? no  3.   Have you tested positive for COVID 19 since your procedure no  4.   Have you had any family members/close contacts diagnosed with the COVID 19 since your procedure?  No   Pt said he has only developed a sneeze post procedure.  No other sx noted.  Pt will call if developes into COVID positive. maw   If yes to any of these questions please route to Joylene John, RN and Joella Prince, RN

## 2021-05-07 DIAGNOSIS — D509 Iron deficiency anemia, unspecified: Secondary | ICD-10-CM | POA: Diagnosis not present

## 2021-05-07 DIAGNOSIS — K7689 Other specified diseases of liver: Secondary | ICD-10-CM | POA: Diagnosis not present

## 2021-05-07 DIAGNOSIS — D631 Anemia in chronic kidney disease: Secondary | ICD-10-CM | POA: Diagnosis not present

## 2021-05-07 DIAGNOSIS — R82998 Other abnormal findings in urine: Secondary | ICD-10-CM | POA: Diagnosis not present

## 2021-05-07 DIAGNOSIS — Z992 Dependence on renal dialysis: Secondary | ICD-10-CM | POA: Diagnosis not present

## 2021-05-07 DIAGNOSIS — E876 Hypokalemia: Secondary | ICD-10-CM | POA: Diagnosis not present

## 2021-05-07 DIAGNOSIS — E8770 Fluid overload, unspecified: Secondary | ICD-10-CM | POA: Diagnosis not present

## 2021-05-07 DIAGNOSIS — Z79899 Other long term (current) drug therapy: Secondary | ICD-10-CM | POA: Diagnosis not present

## 2021-05-07 DIAGNOSIS — N2589 Other disorders resulting from impaired renal tubular function: Secondary | ICD-10-CM | POA: Diagnosis not present

## 2021-05-07 DIAGNOSIS — N2581 Secondary hyperparathyroidism of renal origin: Secondary | ICD-10-CM | POA: Diagnosis not present

## 2021-05-07 DIAGNOSIS — Z4931 Encounter for adequacy testing for hemodialysis: Secondary | ICD-10-CM | POA: Diagnosis not present

## 2021-05-07 DIAGNOSIS — N186 End stage renal disease: Secondary | ICD-10-CM | POA: Diagnosis not present

## 2021-05-10 DIAGNOSIS — E8770 Fluid overload, unspecified: Secondary | ICD-10-CM | POA: Diagnosis not present

## 2021-05-10 DIAGNOSIS — N186 End stage renal disease: Secondary | ICD-10-CM | POA: Diagnosis not present

## 2021-05-10 DIAGNOSIS — D631 Anemia in chronic kidney disease: Secondary | ICD-10-CM | POA: Diagnosis not present

## 2021-05-10 DIAGNOSIS — Z992 Dependence on renal dialysis: Secondary | ICD-10-CM | POA: Diagnosis not present

## 2021-05-10 DIAGNOSIS — N2581 Secondary hyperparathyroidism of renal origin: Secondary | ICD-10-CM | POA: Diagnosis not present

## 2021-05-10 DIAGNOSIS — Z79899 Other long term (current) drug therapy: Secondary | ICD-10-CM | POA: Diagnosis not present

## 2021-05-10 DIAGNOSIS — R82998 Other abnormal findings in urine: Secondary | ICD-10-CM | POA: Diagnosis not present

## 2021-05-10 DIAGNOSIS — Z4931 Encounter for adequacy testing for hemodialysis: Secondary | ICD-10-CM | POA: Diagnosis not present

## 2021-05-10 DIAGNOSIS — D509 Iron deficiency anemia, unspecified: Secondary | ICD-10-CM | POA: Diagnosis not present

## 2021-05-10 DIAGNOSIS — E876 Hypokalemia: Secondary | ICD-10-CM | POA: Diagnosis not present

## 2021-05-10 DIAGNOSIS — K7689 Other specified diseases of liver: Secondary | ICD-10-CM | POA: Diagnosis not present

## 2021-05-10 DIAGNOSIS — N2589 Other disorders resulting from impaired renal tubular function: Secondary | ICD-10-CM | POA: Diagnosis not present

## 2021-05-15 DIAGNOSIS — G4733 Obstructive sleep apnea (adult) (pediatric): Secondary | ICD-10-CM | POA: Diagnosis not present

## 2021-05-15 DIAGNOSIS — I10 Essential (primary) hypertension: Secondary | ICD-10-CM | POA: Diagnosis not present

## 2021-05-16 ENCOUNTER — Other Ambulatory Visit: Payer: Self-pay | Admitting: Internal Medicine

## 2021-05-16 DIAGNOSIS — Z992 Dependence on renal dialysis: Secondary | ICD-10-CM | POA: Diagnosis not present

## 2021-05-16 DIAGNOSIS — Z4931 Encounter for adequacy testing for hemodialysis: Secondary | ICD-10-CM | POA: Diagnosis not present

## 2021-05-16 DIAGNOSIS — D509 Iron deficiency anemia, unspecified: Secondary | ICD-10-CM | POA: Diagnosis not present

## 2021-05-16 DIAGNOSIS — R82998 Other abnormal findings in urine: Secondary | ICD-10-CM | POA: Diagnosis not present

## 2021-05-16 DIAGNOSIS — N2581 Secondary hyperparathyroidism of renal origin: Secondary | ICD-10-CM | POA: Diagnosis not present

## 2021-05-16 DIAGNOSIS — K7689 Other specified diseases of liver: Secondary | ICD-10-CM | POA: Diagnosis not present

## 2021-05-16 DIAGNOSIS — E8770 Fluid overload, unspecified: Secondary | ICD-10-CM | POA: Diagnosis not present

## 2021-05-16 DIAGNOSIS — N186 End stage renal disease: Secondary | ICD-10-CM | POA: Diagnosis not present

## 2021-05-16 DIAGNOSIS — Z79899 Other long term (current) drug therapy: Secondary | ICD-10-CM | POA: Diagnosis not present

## 2021-05-16 DIAGNOSIS — D631 Anemia in chronic kidney disease: Secondary | ICD-10-CM | POA: Diagnosis not present

## 2021-05-16 DIAGNOSIS — E876 Hypokalemia: Secondary | ICD-10-CM | POA: Diagnosis not present

## 2021-05-16 DIAGNOSIS — N2589 Other disorders resulting from impaired renal tubular function: Secondary | ICD-10-CM | POA: Diagnosis not present

## 2021-05-21 ENCOUNTER — Other Ambulatory Visit: Payer: Self-pay | Admitting: Internal Medicine

## 2021-05-21 ENCOUNTER — Other Ambulatory Visit: Payer: Self-pay | Admitting: Family Medicine

## 2021-05-21 DIAGNOSIS — M109 Gout, unspecified: Secondary | ICD-10-CM

## 2021-05-26 ENCOUNTER — Other Ambulatory Visit: Payer: Self-pay | Admitting: Physician Assistant

## 2021-05-26 ENCOUNTER — Other Ambulatory Visit: Payer: Self-pay | Admitting: Allergy & Immunology

## 2021-05-26 DIAGNOSIS — I129 Hypertensive chronic kidney disease with stage 1 through stage 4 chronic kidney disease, or unspecified chronic kidney disease: Secondary | ICD-10-CM | POA: Diagnosis not present

## 2021-05-26 DIAGNOSIS — Z992 Dependence on renal dialysis: Secondary | ICD-10-CM | POA: Diagnosis not present

## 2021-05-26 DIAGNOSIS — N186 End stage renal disease: Secondary | ICD-10-CM | POA: Diagnosis not present

## 2021-05-27 ENCOUNTER — Other Ambulatory Visit: Payer: Self-pay | Admitting: Allergy & Immunology

## 2021-05-27 DIAGNOSIS — N186 End stage renal disease: Secondary | ICD-10-CM | POA: Diagnosis not present

## 2021-05-27 DIAGNOSIS — E876 Hypokalemia: Secondary | ICD-10-CM | POA: Diagnosis not present

## 2021-05-27 DIAGNOSIS — D509 Iron deficiency anemia, unspecified: Secondary | ICD-10-CM | POA: Diagnosis not present

## 2021-05-27 DIAGNOSIS — Z4931 Encounter for adequacy testing for hemodialysis: Secondary | ICD-10-CM | POA: Diagnosis not present

## 2021-05-27 DIAGNOSIS — Z79899 Other long term (current) drug therapy: Secondary | ICD-10-CM | POA: Diagnosis not present

## 2021-05-27 DIAGNOSIS — K7689 Other specified diseases of liver: Secondary | ICD-10-CM | POA: Diagnosis not present

## 2021-05-27 DIAGNOSIS — Z992 Dependence on renal dialysis: Secondary | ICD-10-CM | POA: Diagnosis not present

## 2021-05-27 DIAGNOSIS — E8770 Fluid overload, unspecified: Secondary | ICD-10-CM | POA: Diagnosis not present

## 2021-05-27 DIAGNOSIS — E440 Moderate protein-calorie malnutrition: Secondary | ICD-10-CM | POA: Diagnosis not present

## 2021-05-27 DIAGNOSIS — N2581 Secondary hyperparathyroidism of renal origin: Secondary | ICD-10-CM | POA: Diagnosis not present

## 2021-05-27 DIAGNOSIS — R82998 Other abnormal findings in urine: Secondary | ICD-10-CM | POA: Diagnosis not present

## 2021-05-27 DIAGNOSIS — D631 Anemia in chronic kidney disease: Secondary | ICD-10-CM | POA: Diagnosis not present

## 2021-05-27 NOTE — H&P (View-Only) (Signed)
[facility] [place]  ASSESSMENT / PLAN: Albert Eaton is a 55 y.o. right handed male in need of permanent dialysis access. I reviewed options for dialysis in detail with the patient. I counseled the patient that dialysis access requires surveillance and periodic maintenance. Plan to proceed with laparoscopic peritoneal dialysis catheter placement.  CHIEF COMPLAINT: Desires home dialysis  HISTORY OF PRESENT ILLNESS: Albert Eaton is a 55 y.o. male with end-stage renal disease, dialyzing Monday, Wednesday, and Friday via left upper extremity brachiobasilic arteriovenous fistula created by Dr. Carlis Abbott on 04/26/2018.  This is been working well for him.  He had been getting home hemodialysis with the help of his niece, but is no longer able to do so.  He would like to transition to peritoneal dialysis so he can continue dialysis at home.  He has had a laparoscopic cholecystectomy and a laparoscopic nephrectomy, but no open abdominal surgeries in the past.  Past Medical History:  Diagnosis Date   Anemia    Aortic atherosclerosis (HCC)    Arthritis    Asthma    Cancer ([place])    renal cyst   Chronic diastolic CHF (congestive heart failure) ([place]) 01/06/2017   Echo 7/16 [facility] in [place], [name]) Mild AI, mild LAE, mild concentric LVH, EF 55, normal wall motion, mild to moderate MR, mild PI, RVSP 55 // Echo 10/09/16 ([facility]):  Moderate LVH, grade 2 diastolic dysfunction, mild MR, moderate LAE    CKD (chronic kidney disease)    per pt, waiting on a kidney transplant in near future.   Esophagitis    GERD (gastroesophageal reflux disease)    Gout    Heart murmur    Hepatitis    Hep B   History of nuclear stress test    a. Nuc study 7/16: no scar or ischemia, EF 42 // b. Nuc study 12/17: EF 48, ?small apical ischemia, Low Risk   HIV (human immunodeficiency virus infection) ([place])    HLD (hyperlipidemia)    Hypertension    Hypertensive heart disease with CHF (congestive  heart failure) ([place]) 10/09/2016   Mitral regurgitation    OSA (obstructive sleep apnea)    Post-operative nausea and vomiting    1 time as a child   Sleep apnea    uses c-pap   Thyroid disease    Wears glasses    Wears partial dentures     Past Surgical History:  Procedure Laterality Date   AV FISTULA PLACEMENT Left 07/02/2018   Procedure: Creation of Left arm BRACHIOBASILIC ARTERIOVENOUS  FISTULA;  Surgeon: Marty Heck, MD;  Location: [facility];  Service: Vascular;  Laterality: Left;   Iva Left 08/27/2018   Procedure: Left arm BRACHIOBASILIC VEIN TRANSPOSITION SECOND STAGE;  Surgeon: Marty Heck, MD;  Location: [facility];  Service: Vascular;  Laterality: Left;   BUBBLE STUDY  09/03/2020   Procedure: BUBBLE STUDY;  Surgeon: Fay Records, MD;  Location: [facility];  Service: Cardiovascular;;   CHOLECYSTECTOMY     COLONOSCOPY W/ BIOPSIES AND POLYPECTOMY     GIVENS CAPSULE STUDY N/A 03/01/2018   Procedure: GIVENS CAPSULE STUDY;  Surgeon: Jerene Bears, MD;  Location: [facility];  Service: Gastroenterology;  Laterality: N/A;   HERNIA REPAIR     As baby   MULTIPLE TOOTH EXTRACTIONS     NEPHRECTOMY Left 02/18/2021   NOSE SURGERY     RENAL BIOPSY     TEE WITHOUT CARDIOVERSION N/A 09/03/2020   Procedure:  TRANSESOPHAGEAL ECHOCARDIOGRAM (TEE);  Surgeon: Fay Records, MD;  [facility] END[facility]COPY;  Service: Cardiovascular;  Laterality: N/A;   UPPER GI ENDOSCOPY  04/05/2021    Family History  Problem Relation Age of Onset   Heart failure Mother    Heart attack Maternal Grandmother 25   Hypertension Sister    Multiple sclerosis Sister    Hypertension Brother    Hypertension Sister    Scoliosis Other    Allergic rhinitis Neg Hx    Angioedema Neg Hx    Asthma Neg Hx    Eczema Neg Hx    Immunodeficiency Neg Hx    Urticaria Neg Hx    Colon cancer Neg Hx    Pancreatic cancer Neg Hx    Esophageal cancer Neg Hx     Social History    Socioeconomic History   Marital status: Single    Spouse name: Not on file   Number of children: Not on file   Years of education: Not on file   Highest education level: Not on file  Occupational History   Not on file  Tobacco Use   Smoking status: Former    Types: Cigarettes    Quit date: 2000    Years since quitting: 22.5   Smokeless tobacco: Never  Vaping Use   Vaping Use: Never used  Substance and Sexual Activity   Alcohol use: Yes    Comment: once a month    Drug use: No   Sexual activity: Not Currently    Partners: Male    Birth control/protection: Condom    Comment: declined  condoms 07/11/20  Other Topics Concern   Not on file  Social History Narrative   Not on file   Social Determinants of Health   Financial Resource Strain: Not on file  Food Insecurity: No Food Insecurity   Worried About Charity fundraiser in the Last Year: Never true   [place] in the Last Year: Never true  Transportation Needs: No Transportation Needs   Lack of Transportation (Medical): No   Lack of Transportation (Non-Medical): No  Physical Activity: Not on file  Stress: Not on file  Social Connections: Not on file  Intimate Partner Violence: Not on file    Allergies  Allergen Reactions   Ace Inhibitors Cough and Other (See Comments)   Lisinopril Cough    Current Outpatient Medications  Medication Sig Dispense Refill   ACETAMINOPHEN EXTRA STRENGTH 500 MG tablet Take by mouth.     albuterol (VENTOLIN HFA) 108 (90 Base) MCG/ACT inhaler INHALE 2 PUFFS INTO THE LUNGS EVERY 4 HOURS AS NEEDED FOR WHEEZING OR SHORTNESS OF BREATH 18 g 1   allopurinol (ZYLOPRIM) 100 MG tablet TAKE 1 TABLET(100 MG) BY MOUTH TWICE DAILY FOR 60 DOSES 60 tablet 0   amLODipine (NORVASC) 10 MG tablet Take 1 tablet (10 mg total) by mouth daily. 30 tablet 1   aspirin EC 81 MG tablet Take 1 tablet (81 mg total) by mouth daily. Swallow whole. 90 tablet 3   azelastine (ASTELIN) 0.1 % nasal spray Place 2  sprays into both nostrils 2 (two) times daily as needed for rhinitis. Use in each nostril as directed 30 mL 5   BIKTARVY 50-200-25 MG TABS tablet TAKE 1 TABLET BY MOUTH DAILY 30 tablet 1   calcitRIOL (ROCALTROL) 0.5 MCG capsule Take 0.5 mcg by mouth 3 (three) times a week.     carvedilol (COREG) 25 MG tablet TAKE 1 TABLET BY MOUTH TWICE  DAILY WITH FOOD (Patient taking differently: Take 25 mg by mouth 2 (two) times daily with a meal.) 60 tablet 2   chlorproMAZINE (THORAZINE) 10 MG tablet Take 1 tablet (10 mg total) by mouth 3 (three) times daily as needed for hiccoughs. 60 tablet 0   clonazePAM (KLONOPIN) 0.5 MG tablet Take 1 tablet (0.5 mg total) by mouth at bedtime. 30 tablet 2   clotrimazole-betamethasone (LOTRISONE) cream Apply topically 2 (two) times daily. X 3-5 days. IF symptoms continue, call office 30 g 0   docusate sodium (COLACE) 100 MG capsule Take 100 mg by mouth 2 (two) times daily.     famotidine (PEPCID) 20 MG tablet TAKE 1 TABLET(20 MG) BY MOUTH TWICE DAILY 180 tablet 3   FLOVENT HFA 110 MCG/ACT inhaler INHALE 2 PUFFS INTO THE LUNGS TWICE DAILY 12 g 2   gabapentin (NEURONTIN) 300 MG capsule TAKE 1 CAPSULE(300 MG) BY MOUTH THREE TIMES DAILY (Patient taking differently: Take 300 mg by mouth 3 (three) times daily.) 90 capsule 3   hydrALAZINE (APRESOLINE) 100 MG tablet Take 1 tablet (100 mg total) by mouth 3 (three) times daily. 270 tablet 3   hyoscyamine (LEVSIN SL) 0.125 MG SL tablet Place 1-2 tablets (0.125-0.25 mg total) under the tongue every 8 (eight) hours as needed (lower abdominal pain, spasm). 90 tablet 2   ipratropium (ATROVENT) 0.06 % nasal spray USE 2 SPRAYS IN EACH NOSTRIL 2 TO 3 TIMES DAILY AS NEEDED (Patient taking differently: Place 2 sprays into both nostrils 3 (three) times daily as needed for rhinitis.) 15 mL 1   iron sucrose (VENOFER) 20 MG/ML injection Inject into the vein.     lactulose (CHRONULAC) 10 GM/15ML solution Take 20 g by mouth daily as needed for moderate  constipation.      levothyroxine (SYNTHROID) 25 MCG tablet TAKE 1 TABLET BY MOUTH DAILY BEFORE BREAKFAST (Patient taking differently: Take 25 mcg by mouth daily before breakfast.) 90 tablet 3   lidocaine-prilocaine (EMLA) cream Apply 1 application topically See admin instructions. Apply to access site 1-2 hours before dialysis     lubiprostone (AMITIZA) 24 MCG capsule TAKE 1 CAPSULE(24 MCG) BY MOUTH TWICE DAILY WITH A MEAL 60 capsule 4   methocarbamol (ROBAXIN) 500 MG tablet Take 1 tablet (500 mg total) by mouth every 8 (eight) hours as needed for muscle spasms. 30 tablet 0   Methoxy PEG-Epoetin Beta (MIRCERA IJ) Inject into the skin.     multivitamin (RENA-VIT) TABS tablet Take 1 tablet by mouth at bedtime.     mupirocin ointment (BACTROBAN) 2 % Apply 1 application topically daily as needed (apply to port after dialysis).      Olopatadine HCl (PAZEO) 0.7 % SOLN Place 1 drop into both eyes daily. (Patient taking differently: Place 1 drop into both eyes daily as needed (itchy eyes).) 2.5 mL 5   pantoprazole (PROTONIX) 40 MG tablet Take 1 tablet (40 mg total) by mouth 2 (two) times daily before a meal. (Patient not taking: No sig reported) 60 tablet 1   pantoprazole (PROTONIX) 40 MG tablet Take 1 tablet (40 mg total) by mouth daily. (Patient not taking: No sig reported) 30 tablet 1   PRESCRIPTION MEDICATION Take 1 tablet by mouth daily. Multi vitamin from dialysis     rOPINIRole (REQUIP) 0.5 MG tablet Take 0.5 mg by mouth at bedtime.     rosuvastatin (CRESTOR) 10 MG tablet Take 1 tablet (10 mg total) by mouth daily. 90 tablet 3   senna (SENOKOT) 8.6 MG TABS  tablet Take 2 tablets by mouth daily.     sevelamer carbonate (RENVELA) 800 MG tablet Take 1,600 mg by mouth 3 (three) times daily.     Spacer/Aero-Holding Chambers DEVI 1 Device by Does not apply route 2 (two) times a day. 1 each 1   Vitamin D, Ergocalciferol, (DRISDOL) 1.25 MG (50000 UNIT) CAPS capsule TAKE 1 CAPSULE BY MOUTH EVERY 7 DAYS  (Patient taking differently: Take 50,000 Units by mouth every Tuesday.) 12 capsule 1   No current facility-administered medications for this visit.    REVIEW OF SYSTEMS:  [X]  denotes positive finding, [ ]  denotes negative finding Cardiac  Comments:  Chest pain or chest pressure:    Shortness of breath upon exertion:    Short of breath when lying flat:    Irregular heart rhythm:        Vascular    Pain in calf, thigh, or hip brought on by ambulation:    Pain in feet at night that wakes you up from your sleep:     Blood clot in your veins:    Leg swelling:         Pulmonary    Oxygen at home:    Productive cough:     Wheezing:         Neurologic    Sudden weakness in arms or legs:     Sudden numbness in arms or legs:     Sudden onset of difficulty speaking or slurred speech:    Temporary loss of vision in one eye:     Problems with dizziness:         Gastrointestinal    Blood in stool:     Vomited blood:         Genitourinary    Burning when urinating:     Blood in urine:        Psychiatric    Major depression:         Hematologic    Bleeding problems:    Problems with blood clotting too easily:        Skin    Rashes or ulcers:        Constitutional    Fever or chills:      PHYSICAL EXAM There were no vitals filed for this visit.  Constitutional: well appearing. no distress. Appears well nourished.  Neurologic: CN intact. no focal findings. no sensory loss. Psychiatric:  Mood and affect symmetric and appropriate. Eyes:  No icterus. No conjunctival pallor. Ears, nose, throat:  mucous membranes moist. Midline trachea.  Cardiac: regular rate and rhythm.  Respiratory:  unlabored. Abdominal:  soft, non-tender, non-distended. Scars consistent with lap chole and lap left nephrectomy. Peripheral vascular: LUE BC AVF with good thrill.  Extremity: no edema. no cyanosis. no pallor.  Skin: no gangrene. no ulceration.  Lymphatic: no Stemmer's sign. no palpable  lymphadenopathy.  PERTINENT LABORATORY AND RADIOLOGIC DATA  Most recent CBC CBC Latest Ref Rng & Units 04/01/2021 02/11/2021 09/04/2020  WBC 4.0 - 10.5 K/uL 6.6 8.8 6.0  Hemoglobin 13.0 - 17.0 g/dL 11.9(L) 9.5(L) 9.2(L)  Hematocrit 39.0 - 52.0 % 33.5(L) 26.9(L) 26.5(L)  Platelets 150 - 400 K/uL 164 206 217     Most recent CMP CMP Latest Ref Rng & Units 05/01/2021 04/04/2021 04/01/2021  Glucose 70 - 99 mg/dL - 89 105(H)  BUN 6 - 23 mg/dL - 43(H) 100(H)  Creatinine 0.40 - 1.50 mg/dL - 8.20(HH) 17.05(H)  Sodium 135 - 145 mEq/L - 139 132(L)  Potassium  3.5 - 5.1 mEq/L - 3.7 3.8  Chloride 96 - 112 mEq/L - 91(L) 80(L)  CO2 19 - 32 mEq/L - 35(H) 25  Calcium 8.4 - 10.5 mg/dL - 10.3 9.0  Total Protein 6.0 - 8.5 g/dL 8.2 - -  Total Bilirubin 0.0 - 1.2 mg/dL 0.4 - -  Alkaline Phos 44 - 121 IU/L 96 - -  AST 0 - 40 IU/L 11 - -  ALT 0 - 44 IU/L 13 - -    Renal function CrCl cannot be calculated (Patient's most recent lab result is older than the maximum 21 days allowed.).  Hemoglobin A1C (%)  Date Value  05/01/2020 5.3   HbA1c, POC (prediabetic range) (%)  Date Value  05/01/2020 5.3 (A)   HbA1c, POC (controlled diabetic range) (%)  Date Value  05/01/2020 5.3   HbA1c POC (<> result, manual entry) (%)  Date Value  05/01/2020 5.3    LDL Cholesterol (Calc)  Date Value Ref Range Status  06/27/2020 113 (H) mg/dL (calc) Final    Comment:    Reference range: <100 . Desirable range <100 mg/dL for primary prevention;   <70 mg/dL for patients with CHD or diabetic patients  with > or = 2 CHD risk factors. Marland Kitchen LDL-C is now calculated using the Martin-Hopkins  calculation, which is a validated novel method providing  better accuracy than the Friedewald equation in the  estimation of LDL-C.  Cresenciano Genre et al. Annamaria Helling. 2481;859(09): 2061-2068  (http://education.[facility].com/faq/FAQ164)    LDL Chol Calc (NIH)  Date Value Ref Range Status  05/01/2021 60 0 - 99 mg/dL Final    [name].  Stanford Breed, MD [facility] Phone Number: [phone] 05/27/2021 12:06 PM

## 2021-05-27 NOTE — Progress Notes (Signed)
[facility] [place]  ASSESSMENT / PLAN: Albert Eaton is a 55 y.o. right handed male in need of permanent dialysis access. I reviewed options for dialysis in detail with the patient. I counseled the patient that dialysis access requires surveillance and periodic maintenance. Plan to proceed with laparoscopic peritoneal dialysis catheter placement.  CHIEF COMPLAINT: Desires home dialysis  HISTORY OF PRESENT ILLNESS: Albert Eaton is a 55 y.o. male with end-stage renal disease, dialyzing Monday, Wednesday, and Friday via left upper extremity brachiobasilic arteriovenous fistula created by Dr. Carlis Abbott on 04/26/2018.  This is been working well for him.  He had been getting home hemodialysis with the help of his niece, but is no longer able to do so.  He would like to transition to peritoneal dialysis so he can continue dialysis at home.  He has had a laparoscopic cholecystectomy and a laparoscopic nephrectomy, but no open abdominal surgeries in the past.  Past Medical History:  Diagnosis Date   Anemia    Aortic atherosclerosis (HCC)    Arthritis    Asthma    Cancer ([place])    renal cyst   Chronic diastolic CHF (congestive heart failure) ([place]) 01/06/2017   Echo 7/16 [facility] in [place], [name]) Mild AI, mild LAE, mild concentric LVH, EF 55, normal wall motion, mild to moderate MR, mild PI, RVSP 55 // Echo 10/09/16 ([facility]):  Moderate LVH, grade 2 diastolic dysfunction, mild MR, moderate LAE    CKD (chronic kidney disease)    per pt, waiting on a kidney transplant in near future.   Esophagitis    GERD (gastroesophageal reflux disease)    Gout    Heart murmur    Hepatitis    Hep B   History of nuclear stress test    a. Nuc study 7/16: no scar or ischemia, EF 42 // b. Nuc study 12/17: EF 48, ?small apical ischemia, Low Risk   HIV (human immunodeficiency virus infection) ([place])    HLD (hyperlipidemia)    Hypertension    Hypertensive heart disease with CHF (congestive  heart failure) ([place]) 10/09/2016   Mitral regurgitation    OSA (obstructive sleep apnea)    Post-operative nausea and vomiting    1 time as a child   Sleep apnea    uses c-pap   Thyroid disease    Wears glasses    Wears partial dentures     Past Surgical History:  Procedure Laterality Date   AV FISTULA PLACEMENT Left 07/02/2018   Procedure: Creation of Left arm BRACHIOBASILIC ARTERIOVENOUS  FISTULA;  Surgeon: Marty Heck, MD;  Location: [place];  Service: Vascular;  Laterality: Left;   [place] Left 08/27/2018   Procedure: Left arm BRACHIOBASILIC VEIN TRANSPOSITION SECOND STAGE;  Surgeon: Marty Heck, MD;  Location: [place];  Service: Vascular;  Laterality: Left;   BUBBLE STUDY  09/03/2020   Procedure: BUBBLE STUDY;  Surgeon: Fay Records, MD;  Location: [place];  Service: Cardiovascular;;   CHOLECYSTECTOMY     COLONOSCOPY W/ BIOPSIES AND POLYPECTOMY     GIVENS CAPSULE STUDY N/A 03/01/2018   Procedure: GIVENS CAPSULE STUDY;  Surgeon: Jerene Bears, MD;  Location: [place];  Service: Gastroenterology;  Laterality: N/A;   HERNIA REPAIR     As baby   MULTIPLE TOOTH EXTRACTIONS     NEPHRECTOMY Left 02/18/2021   NOSE SURGERY     RENAL BIOPSY     TEE WITHOUT CARDIOVERSION N/A 09/03/2020   Procedure:  TRANSESOPHAGEAL ECHOCARDIOGRAM (TEE);  Surgeon: Fay Records, MD;  Location: [facility] ENDOSCOPY;  Service: Cardiovascular;  Laterality: N/A;   UPPER GI ENDOSCOPY  04/05/2021    Family History  Problem Relation Age of Onset   Heart failure Mother    Heart attack Maternal Grandmother 78   Hypertension Sister    Multiple sclerosis Sister    Hypertension Brother    Hypertension Sister    Scoliosis Other    Allergic rhinitis Neg Hx    Angioedema Neg Hx    Asthma Neg Hx    Eczema Neg Hx    Immunodeficiency Neg Hx    Urticaria Neg Hx    Colon cancer Neg Hx    Pancreatic cancer Neg Hx    Esophageal cancer Neg Hx     Social History    Socioeconomic History   Marital status: Single    Spouse name: Not on file   Number of children: Not on file   Years of education: Not on file   Highest education level: Not on file  Occupational History   Not on file  Tobacco Use   Smoking status: Former    Types: Cigarettes    Quit date: 2000    Years since quitting: 22.5   Smokeless tobacco: Never  Vaping Use   Vaping Use: Never used  Substance and Sexual Activity   Alcohol use: Yes    Comment: once a month    Drug use: No   Sexual activity: Not Currently    Partners: Male    Birth control/protection: Condom    Comment: declined  condoms 07/11/20  Other Topics Concern   Not on file  Social History Narrative   Not on file   Social Determinants of Health   Financial Resource Strain: Not on file  Food Insecurity: No Food Insecurity   Worried About Charity fundraiser in the Last Year: Never true   [place] in the Last Year: Never true  Transportation Needs: No Transportation Needs   Lack of Transportation (Medical): No   Lack of Transportation (Non-Medical): No  Physical Activity: Not on file  Stress: Not on file  Social Connections: Not on file  Intimate Partner Violence: Not on file    Allergies  Allergen Reactions   Ace Inhibitors Cough and Other (See Comments)   Lisinopril Cough    Current Outpatient Medications  Medication Sig Dispense Refill   ACETAMINOPHEN EXTRA STRENGTH 500 MG tablet Take by mouth.     albuterol (VENTOLIN HFA) 108 (90 Base) MCG/ACT inhaler INHALE 2 PUFFS INTO THE LUNGS EVERY 4 HOURS AS NEEDED FOR WHEEZING OR SHORTNESS OF BREATH 18 g 1   allopurinol (ZYLOPRIM) 100 MG tablet TAKE 1 TABLET(100 MG) BY MOUTH TWICE DAILY FOR 60 DOSES 60 tablet 0   amLODipine (NORVASC) 10 MG tablet Take 1 tablet (10 mg total) by mouth daily. 30 tablet 1   aspirin EC 81 MG tablet Take 1 tablet (81 mg total) by mouth daily. Swallow whole. 90 tablet 3   azelastine (ASTELIN) 0.1 % nasal spray Place 2  sprays into both nostrils 2 (two) times daily as needed for rhinitis. Use in each nostril as directed 30 mL 5   BIKTARVY 50-200-25 MG TABS tablet TAKE 1 TABLET BY MOUTH DAILY 30 tablet 1   calcitRIOL (ROCALTROL) 0.5 MCG capsule Take 0.5 mcg by mouth 3 (three) times a week.     carvedilol (COREG) 25 MG tablet TAKE 1 TABLET BY MOUTH TWICE  DAILY WITH FOOD (Patient taking differently: Take 25 mg by mouth 2 (two) times daily with a meal.) 60 tablet 2   chlorproMAZINE (THORAZINE) 10 MG tablet Take 1 tablet (10 mg total) by mouth 3 (three) times daily as needed for hiccoughs. 60 tablet 0   clonazePAM (KLONOPIN) 0.5 MG tablet Take 1 tablet (0.5 mg total) by mouth at bedtime. 30 tablet 2   clotrimazole-betamethasone (LOTRISONE) cream Apply topically 2 (two) times daily. X 3-5 days. IF symptoms continue, call office 30 g 0   docusate sodium (COLACE) 100 MG capsule Take 100 mg by mouth 2 (two) times daily.     famotidine (PEPCID) 20 MG tablet TAKE 1 TABLET(20 MG) BY MOUTH TWICE DAILY 180 tablet 3   FLOVENT HFA 110 MCG/ACT inhaler INHALE 2 PUFFS INTO THE LUNGS TWICE DAILY 12 g 2   gabapentin (NEURONTIN) 300 MG capsule TAKE 1 CAPSULE(300 MG) BY MOUTH THREE TIMES DAILY (Patient taking differently: Take 300 mg by mouth 3 (three) times daily.) 90 capsule 3   hydrALAZINE (APRESOLINE) 100 MG tablet Take 1 tablet (100 mg total) by mouth 3 (three) times daily. 270 tablet 3   hyoscyamine (LEVSIN SL) 0.125 MG SL tablet Place 1-2 tablets (0.125-0.25 mg total) under the tongue every 8 (eight) hours as needed (lower abdominal pain, spasm). 90 tablet 2   ipratropium (ATROVENT) 0.06 % nasal spray USE 2 SPRAYS IN EACH NOSTRIL 2 TO 3 TIMES DAILY AS NEEDED (Patient taking differently: Place 2 sprays into both nostrils 3 (three) times daily as needed for rhinitis.) 15 mL 1   iron sucrose (VENOFER) 20 MG/ML injection Inject into the vein.     lactulose (CHRONULAC) 10 GM/15ML solution Take 20 g by mouth daily as needed for moderate  constipation.      levothyroxine (SYNTHROID) 25 MCG tablet TAKE 1 TABLET BY MOUTH DAILY BEFORE BREAKFAST (Patient taking differently: Take 25 mcg by mouth daily before breakfast.) 90 tablet 3   lidocaine-prilocaine (EMLA) cream Apply 1 application topically See admin instructions. Apply to access site 1-2 hours before dialysis     lubiprostone (AMITIZA) 24 MCG capsule TAKE 1 CAPSULE(24 MCG) BY MOUTH TWICE DAILY WITH A MEAL 60 capsule 4   methocarbamol (ROBAXIN) 500 MG tablet Take 1 tablet (500 mg total) by mouth every 8 (eight) hours as needed for muscle spasms. 30 tablet 0   Methoxy PEG-Epoetin Beta (MIRCERA IJ) Inject into the skin.     multivitamin (RENA-VIT) TABS tablet Take 1 tablet by mouth at bedtime.     mupirocin ointment (BACTROBAN) 2 % Apply 1 application topically daily as needed (apply to port after dialysis).      Olopatadine HCl (PAZEO) 0.7 % SOLN Place 1 drop into both eyes daily. (Patient taking differently: Place 1 drop into both eyes daily as needed (itchy eyes).) 2.5 mL 5   pantoprazole (PROTONIX) 40 MG tablet Take 1 tablet (40 mg total) by mouth 2 (two) times daily before a meal. (Patient not taking: No sig reported) 60 tablet 1   pantoprazole (PROTONIX) 40 MG tablet Take 1 tablet (40 mg total) by mouth daily. (Patient not taking: No sig reported) 30 tablet 1   PRESCRIPTION MEDICATION Take 1 tablet by mouth daily. Multi vitamin from dialysis     rOPINIRole (REQUIP) 0.5 MG tablet Take 0.5 mg by mouth at bedtime.     rosuvastatin (CRESTOR) 10 MG tablet Take 1 tablet (10 mg total) by mouth daily. 90 tablet 3   senna (SENOKOT) 8.6 MG TABS  tablet Take 2 tablets by mouth daily.     sevelamer carbonate (RENVELA) 800 MG tablet Take 1,600 mg by mouth 3 (three) times daily.     Spacer/Aero-Holding Chambers DEVI 1 Device by Does not apply route 2 (two) times a day. 1 each 1   Vitamin D, Ergocalciferol, (DRISDOL) 1.25 MG (50000 UNIT) CAPS capsule TAKE 1 CAPSULE BY MOUTH EVERY 7 DAYS  (Patient taking differently: Take 50,000 Units by mouth every Tuesday.) 12 capsule 1   No current facility-administered medications for this visit.    REVIEW OF SYSTEMS:  [X]  denotes positive finding, [ ]  denotes negative finding Cardiac  Comments:  Chest pain or chest pressure:    Shortness of breath upon exertion:    Short of breath when lying flat:    Irregular heart rhythm:        Vascular    Pain in calf, thigh, or hip brought on by ambulation:    Pain in feet at night that wakes you up from your sleep:     Blood clot in your veins:    Leg swelling:         Pulmonary    Oxygen at home:    Productive cough:     Wheezing:         Neurologic    Sudden weakness in arms or legs:     Sudden numbness in arms or legs:     Sudden onset of difficulty speaking or slurred speech:    Temporary loss of vision in one eye:     Problems with dizziness:         Gastrointestinal    Blood in stool:     Vomited blood:         Genitourinary    Burning when urinating:     Blood in urine:        Psychiatric    Major depression:         Hematologic    Bleeding problems:    Problems with blood clotting too easily:        Skin    Rashes or ulcers:        Constitutional    Fever or chills:      PHYSICAL EXAM There were no vitals filed for this visit.  Constitutional: well appearing. no distress. Appears well nourished.  Neurologic: CN intact. no focal findings. no sensory loss. Psychiatric:  Mood and affect symmetric and appropriate. Eyes:  No icterus. No conjunctival pallor. Ears, nose, throat:  mucous membranes moist. Midline trachea.  Cardiac: regular rate and rhythm.  Respiratory:  unlabored. Abdominal:  soft, non-tender, non-distended. Scars consistent with lap chole and lap left nephrectomy. Peripheral vascular: LUE BC AVF with good thrill.  Extremity: no edema. no cyanosis. no pallor.  Skin: no gangrene. no ulceration.  Lymphatic: no Stemmer's sign. no palpable  lymphadenopathy.  PERTINENT LABORATORY AND RADIOLOGIC DATA  Most recent CBC CBC Latest Ref Rng & Units 04/01/2021 02/11/2021 09/04/2020  WBC 4.0 - 10.5 K/uL 6.6 8.8 6.0  Hemoglobin 13.0 - 17.0 g/dL 11.9(L) 9.5(L) 9.2(L)  Hematocrit 39.0 - 52.0 % 33.5(L) 26.9(L) 26.5(L)  Platelets 150 - 400 K/uL 164 206 217     Most recent CMP CMP Latest Ref Rng & Units 05/01/2021 04/04/2021 04/01/2021  Glucose 70 - 99 mg/dL - 89 105(H)  BUN 6 - 23 mg/dL - 43(H) 100(H)  Creatinine 0.40 - 1.50 mg/dL - 8.20(HH) 17.05(H)  Sodium 135 - 145 mEq/L - 139 132(L)  Potassium  3.5 - 5.1 mEq/L - 3.7 3.8  Chloride 96 - 112 mEq/L - 91(L) 80(L)  CO2 19 - 32 mEq/L - 35(H) 25  Calcium 8.4 - 10.5 mg/dL - 10.3 9.0  Total Protein 6.0 - 8.5 g/dL 8.2 - -  Total Bilirubin 0.0 - 1.2 mg/dL 0.4 - -  Alkaline Phos 44 - 121 IU/L 96 - -  AST 0 - 40 IU/L 11 - -  ALT 0 - 44 IU/L 13 - -    Renal function CrCl cannot be calculated (Patient's most recent lab result is older than the maximum 21 days allowed.).  Hemoglobin A1C (%)  Date Value  05/01/2020 5.3   HbA1c, POC (prediabetic range) (%)  Date Value  05/01/2020 5.3 (A)   HbA1c, POC (controlled diabetic range) (%)  Date Value  05/01/2020 5.3   HbA1c POC (<> result, manual entry) (%)  Date Value  05/01/2020 5.3    LDL Cholesterol (Calc)  Date Value Ref Range Status  06/27/2020 113 (H) mg/dL (calc) Final    Comment:    Reference range: <100 . Desirable range <100 mg/dL for primary prevention;   <70 mg/dL for patients with CHD or diabetic patients  with > or = 2 CHD risk factors. Marland Kitchen LDL-C is now calculated using the Martin-Hopkins  calculation, which is a validated novel method providing  better accuracy than the Friedewald equation in the  estimation of LDL-C.  Cresenciano Genre et al. Annamaria Helling. 2574;935(52): 2061-2068  (http://education.[employer].com/faq/FAQ164)    LDL Chol Calc (NIH)  Date Value Ref Range Status  05/01/2021 60 0 - 99 mg/dL Final    [name].  Stanford Breed, MD [facility] [place] [facility] Phone Number: [phone] 05/27/2021 12:06 PM

## 2021-05-27 NOTE — Telephone Encounter (Signed)
Courtesy refill for Flovent 110 mcg x 1 with no refills. Patient is due for a 6 month follow up.

## 2021-05-28 ENCOUNTER — Other Ambulatory Visit: Payer: Self-pay

## 2021-05-28 ENCOUNTER — Encounter: Payer: Self-pay | Admitting: Vascular Surgery

## 2021-05-28 ENCOUNTER — Ambulatory Visit (INDEPENDENT_AMBULATORY_CARE_PROVIDER_SITE_OTHER): Payer: Medicare Other | Admitting: Vascular Surgery

## 2021-05-28 VITALS — BP 127/83 | HR 80 | Temp 98.7°F | Resp 20 | Ht 70.0 in | Wt 209.0 lb

## 2021-05-28 DIAGNOSIS — N186 End stage renal disease: Secondary | ICD-10-CM | POA: Diagnosis not present

## 2021-05-28 DIAGNOSIS — Z992 Dependence on renal dialysis: Secondary | ICD-10-CM | POA: Diagnosis not present

## 2021-05-28 DIAGNOSIS — Z23 Encounter for immunization: Secondary | ICD-10-CM | POA: Diagnosis not present

## 2021-05-29 ENCOUNTER — Encounter (HOSPITAL_COMMUNITY): Payer: Self-pay | Admitting: Vascular Surgery

## 2021-05-29 ENCOUNTER — Other Ambulatory Visit: Payer: Self-pay

## 2021-05-29 DIAGNOSIS — Z79899 Other long term (current) drug therapy: Secondary | ICD-10-CM | POA: Diagnosis not present

## 2021-05-29 DIAGNOSIS — E440 Moderate protein-calorie malnutrition: Secondary | ICD-10-CM | POA: Diagnosis not present

## 2021-05-29 DIAGNOSIS — N186 End stage renal disease: Secondary | ICD-10-CM | POA: Diagnosis not present

## 2021-05-29 DIAGNOSIS — D509 Iron deficiency anemia, unspecified: Secondary | ICD-10-CM | POA: Diagnosis not present

## 2021-05-29 DIAGNOSIS — Z4931 Encounter for adequacy testing for hemodialysis: Secondary | ICD-10-CM | POA: Diagnosis not present

## 2021-05-29 DIAGNOSIS — R82998 Other abnormal findings in urine: Secondary | ICD-10-CM | POA: Diagnosis not present

## 2021-05-29 DIAGNOSIS — N2581 Secondary hyperparathyroidism of renal origin: Secondary | ICD-10-CM | POA: Diagnosis not present

## 2021-05-29 DIAGNOSIS — Z992 Dependence on renal dialysis: Secondary | ICD-10-CM | POA: Diagnosis not present

## 2021-05-29 DIAGNOSIS — E8770 Fluid overload, unspecified: Secondary | ICD-10-CM | POA: Diagnosis not present

## 2021-05-29 DIAGNOSIS — K7689 Other specified diseases of liver: Secondary | ICD-10-CM | POA: Diagnosis not present

## 2021-05-29 DIAGNOSIS — D631 Anemia in chronic kidney disease: Secondary | ICD-10-CM | POA: Diagnosis not present

## 2021-05-29 DIAGNOSIS — E876 Hypokalemia: Secondary | ICD-10-CM | POA: Diagnosis not present

## 2021-05-29 NOTE — Anesthesia Preprocedure Evaluation (Addendum)
Anesthesia Evaluation  Patient identified by MRN, date of birth, ID band Patient awake    Reviewed: Allergy & Precautions, NPO status , Patient's Chart, lab work & pertinent test results  Airway Mallampati: I  TM Distance: >3 FB Neck ROM: Full    Dental  (+) Missing, Chipped, Dental Advisory Given,    Pulmonary asthma , sleep apnea and Continuous Positive Airway Pressure Ventilation , former smoker,    Pulmonary exam normal breath sounds clear to auscultation       Cardiovascular hypertension, + CAD and +CHF  Normal cardiovascular exam+ Valvular Problems/Murmurs MR  Rhythm:Regular Rate:Normal  EKG 04/01/21: Normal sinus rhythm. Rate 86. Prolonged QTc 509  Echocardiogram 03/04/21 EF 60-65, no RWMA, mild LVH, normal RVSF, RVSP 44.2, severe LAE, mild RAE, mod to severe MR, asc Ao 39 mm  LONG TERM MONITOR (8-14 DAYS) INTERPRETATION 12/28/2020 Study review shows an average heart rate of 85 bpm with the predominant rhythm is normal sinus. There are long runs of SVT present with heart rates ranging from 145 to 210 bpm. There are occasional PVCs and nonsustained ventricular runs. We will review medical program. Recommend EP referral.  CT CORONARY MORPH W/CTA COR W/SCORE W/CA W/CM &/OR WO/CM 10/10/2020 1. Coronary calcium score of 437. This was 98th percentile for age and sex matched control. 2. Normal coronary origin with left dominance. 3. Mild atherosclerosis. CAD RADS 2. IMPRESSION: 1. Coronary CTA FFR flow analysis demonstrates no hemodynamically flow limiting lesion   Neuro/Psych negative neurological ROS  negative psych ROS   GI/Hepatic GERD  ,(+) Hepatitis -, B  Endo/Other  Hypothyroidism   Renal/GU ESRF and DialysisRenal disease (Cr 9.40, K 3.5, dialyis MWF, completed dialysis yesterday)RCC s/p left nephrectomy 01/2021  negative genitourinary   Musculoskeletal  (+) Arthritis ,   Abdominal   Peds   Hematology  (+) HIV,   Anesthesia Other Findings   Reproductive/Obstetrics                           Anesthesia Physical Anesthesia Plan  ASA: 3  Anesthesia Plan: General   Post-op Pain Management:    Induction: Intravenous  PONV Risk Score and Plan: 2 and Midazolam, Dexamethasone and Ondansetron  Airway Management Planned: Oral ETT  Additional Equipment:   Intra-op Plan:   Post-operative Plan: Extubation in OR  Informed Consent: I have reviewed the patients History and Physical, chart, labs and discussed the procedure including the risks, benefits and alternatives for the proposed anesthesia with the patient or authorized representative who has indicated his/her understanding and acceptance.     Dental advisory given  Plan Discussed with: CRNA  Anesthesia Plan Comments: (  )       Anesthesia Quick Evaluation

## 2021-05-29 NOTE — Progress Notes (Signed)
DUE TO COVID-19 ONLY ONE VISITOR IS ALLOWED TO COME WITH YOU AND STAY IN THE WAITING ROOM ONLY DURING PRE OP AND PROCEDURE DAY OF SURGERY.   PCP - Cammie Sickle, NP Cardiologist - Dr Landis Martins Nephrology - Dr Madelon Lips   Chest x-ray - 04/01/21 (2V) EKG - 04/01/21 Stress Test - 01/31/20 ECHO - 03/04/21 Cardiac Cath - n/a  Sleep Study -  Yes CPAP - uses cpap   Aspirin Instructions: Follow your surgeon's instructions on when to stop aspirin prior to surgery,  If no instructions were given by your surgeon then you will need to call the office for those instructions.  Anesthesia review: Yes  STOP now taking any Aspirin (unless otherwise instructed by your surgeon), Aleve, Naproxen, Ibuprofen, Motrin, Advil, Goody's, BC's, all herbal medications, fish oil, and all vitamins.   Coronavirus Screening Covid test n/a - Ambulatory Surgery Do you have any of the following symptoms:  Cough yes/no: No Fever (>100.73F)  yes/no: No Runny nose yes/no: No Sore throat yes/no: No Difficulty breathing/shortness of breath  yes/no: No  Have you traveled in the last 14 days and where? yes/no: No  Patient verbalized understanding of instructions that were given via phone.

## 2021-05-29 NOTE — Progress Notes (Addendum)
Anesthesia Chart Review: Same day workup  Follows with cardiology for history of HFpEF, MR (moderate to severe by echo 5/22), CAD (nonobstructive by coronary CTA in 12/21), HTN, HLD, OSA, atrial tachycardia (eval by EP 3/22, conservative management recommended).  Last seen by cardiology 03/06/2021, overall stable, continue medical management, 41-monthfollow-up for moderate to severe MR was recommended.  Renal cell carcinoma status post recent left nephrectomy at [facility] April 2022.  ESRD on home hemodialysis Monday Tuesday Thursday Friday.  Patient will need day of surgery labs and evaluation.  EKG 04/01/21: Normal sinus rhythm. Rate 86.  Prolonged QTc 509  Echocardiogram 03/04/21 EF 60-65, no RWMA, mild LVH, normal RVSF, RVSP 44.2, severe LAE, mild RAE, mod to severe MR, asc Ao 39 mm   LONG TERM MONITOR (8-14 DAYS) INTERPRETATION 12/28/2020 Study review shows an average heart rate of 85 bpm with the predominant rhythm is normal sinus.  There are long runs of SVT present with heart rates ranging from 145 to 210 bpm.  There are occasional PVCs and nonsustained ventricular runs.  We will review medical program.  Recommend EP referral.   CT CORONARY MORPH W/CTA COR W/SCORE W/CA W/CM &/OR WO/CM 10/10/2020 RCA is a small non dominant artery that gives rise to PDA and PLVB. There is mild calcified plaque in the proximal RCA with associated stenosis of 25-49%. There is significant noise artifact prohibiting accurate assessment for non calcified plaque.   Left main is a large artery that gives rise to LAD and LCX arteries. There is minimal calcified plaque in the proximal and distal LM with associated stenosis of 1-24%.   LAD is a large vessel that gives rise to 3 moderate to large sized diagonals. There is mild mixed plaque in the ostial and proximal LAD with associated stenosis of 25-49% and scattered mild calcified plaque in the mid LAD with associated stenosis of 25-49%.   LCX is a dominant  artery that gives rise to one large OM1 branch and then a moderate sized LPLA and LPDA. There is mild calcified plaque in the proximal OM1 and proximal PDA with associated stenosis of 25-49%. IMPRESSION: 1. Coronary calcium score of 437. This was 98th percentile for age and sex matched control. 2.  Normal coronary origin with left dominance. 3.  Mild atherosclerosis.  CAD RADS 2. IMPRESSION: 1. Coronary CTA FFR flow analysis demonstrates no hemodynamically flow limiting lesion.   GATED SPECT MYO PERF W/LEXISCAN STRESS 1D 01/31/2020 Narrative  Nuclear stress EF: 51%. The left ventricular ejection fraction is mildly decreased (45-54%). The LV appears to be mildly dilated.  There was no ST segment deviation noted during stress.  This is a low risk study. There is no evidence of ischemia or previous infarction.  The study is normal.   Myocardial Perfusion Imaging Study 10/10/16 EF 48, possible very small area of apical ischemia, low risk   Echo 10/09/16 Moderate LVH, grade 2 diastolic dysfunction, mild MR, moderate LAE   [facility] [place] Mild AI, mild LAE, mild concentric LVH, EF 55, normal wall motion, mild to moderate MR, mild PI, RVSP 55   [facility] Perfusion [facility] [place] Impression: 1.  Normal exercise nuclear stress test revealing no evidence of ischemia or scar. 2.  Abnormal gated wall motion study revealing moderate global hypokinesis. 3.  Abnormal left ventricular ejection fraction of 42%.    JWynonia MustyMHigh Desert Surgery Center LLCShort Stay Center/Anesthesiology Phone [phone]/12/2020 10:33 AM

## 2021-05-30 ENCOUNTER — Ambulatory Visit (HOSPITAL_COMMUNITY): Payer: Medicare Other | Admitting: Physician Assistant

## 2021-05-30 ENCOUNTER — Encounter (HOSPITAL_COMMUNITY): Payer: Self-pay | Admitting: Vascular Surgery

## 2021-05-30 ENCOUNTER — Encounter (HOSPITAL_COMMUNITY)
Admission: RE | Disposition: A | Discharge status: Home / Self Care | Payer: Self-pay | Source: Home / Self Care | Attending: Vascular Surgery

## 2021-05-30 ENCOUNTER — Ambulatory Visit (HOSPITAL_COMMUNITY)
Admission: RE | Admit: 2021-05-30 | Discharge: 2021-05-30 | Disposition: A | Discharge status: Home / Self Care | Payer: Medicare Other | Attending: Vascular Surgery | Admitting: Vascular Surgery

## 2021-05-30 DIAGNOSIS — Z7982 Long term (current) use of aspirin: Secondary | ICD-10-CM | POA: Insufficient documentation

## 2021-05-30 DIAGNOSIS — N185 Chronic kidney disease, stage 5: Secondary | ICD-10-CM | POA: Diagnosis not present

## 2021-05-30 DIAGNOSIS — Z7951 Long term (current) use of inhaled steroids: Secondary | ICD-10-CM | POA: Insufficient documentation

## 2021-05-30 DIAGNOSIS — Z888 Allergy status to other drugs, medicaments and biological substances status: Secondary | ICD-10-CM | POA: Diagnosis not present

## 2021-05-30 DIAGNOSIS — Z87891 Personal history of nicotine dependence: Secondary | ICD-10-CM | POA: Diagnosis not present

## 2021-05-30 DIAGNOSIS — Z992 Dependence on renal dialysis: Secondary | ICD-10-CM | POA: Diagnosis not present

## 2021-05-30 DIAGNOSIS — B20 Human immunodeficiency virus [HIV] disease: Secondary | ICD-10-CM | POA: Diagnosis not present

## 2021-05-30 DIAGNOSIS — I132 Hypertensive heart and chronic kidney disease with heart failure and with stage 5 chronic kidney disease, or end stage renal disease: Secondary | ICD-10-CM | POA: Diagnosis not present

## 2021-05-30 DIAGNOSIS — I5033 Acute on chronic diastolic (congestive) heart failure: Secondary | ICD-10-CM | POA: Diagnosis not present

## 2021-05-30 DIAGNOSIS — D631 Anemia in chronic kidney disease: Secondary | ICD-10-CM | POA: Diagnosis not present

## 2021-05-30 DIAGNOSIS — Z79890 Hormone replacement therapy (postmenopausal): Secondary | ICD-10-CM | POA: Insufficient documentation

## 2021-05-30 DIAGNOSIS — Z79899 Other long term (current) drug therapy: Secondary | ICD-10-CM | POA: Diagnosis not present

## 2021-05-30 DIAGNOSIS — Z905 Acquired absence of kidney: Secondary | ICD-10-CM | POA: Insufficient documentation

## 2021-05-30 DIAGNOSIS — K660 Peritoneal adhesions (postprocedural) (postinfection): Secondary | ICD-10-CM | POA: Diagnosis not present

## 2021-05-30 DIAGNOSIS — I5032 Chronic diastolic (congestive) heart failure: Secondary | ICD-10-CM | POA: Insufficient documentation

## 2021-05-30 DIAGNOSIS — N19 Unspecified kidney failure: Secondary | ICD-10-CM

## 2021-05-30 DIAGNOSIS — N186 End stage renal disease: Secondary | ICD-10-CM | POA: Insufficient documentation

## 2021-05-30 HISTORY — PX: CAPD INSERTION: SHX5233

## 2021-05-30 HISTORY — DX: Hypothyroidism, unspecified: E03.9

## 2021-05-30 LAB — POCT I-STAT, CHEM 8
BUN: 31 mg/dL — ABNORMAL HIGH (ref 6–20)
Calcium, Ion: 1.06 mmol/L — ABNORMAL LOW (ref 1.15–1.40)
Chloride: 94 mmol/L — ABNORMAL LOW (ref 98–111)
Creatinine, Ser: 9.4 mg/dL — ABNORMAL HIGH (ref 0.61–1.24)
Glucose, Bld: 115 mg/dL — ABNORMAL HIGH (ref 70–99)
HCT: 37 % — ABNORMAL LOW (ref 39.0–52.0)
Hemoglobin: 12.6 g/dL — ABNORMAL LOW (ref 13.0–17.0)
Potassium: 3.5 mmol/L (ref 3.5–5.1)
Sodium: 132 mmol/L — ABNORMAL LOW (ref 135–145)
TCO2: 29 mmol/L (ref 22–32)

## 2021-05-30 SURGERY — LAPAROSCOPIC INSERTION CONTINUOUS AMBULATORY PERITONEAL DIALYSIS  (CAPD) CATHETER
Anesthesia: General

## 2021-05-30 MED ORDER — PROPOFOL 10 MG/ML IV BOLUS
INTRAVENOUS | Status: AC
  Filled 2021-05-30: qty 20

## 2021-05-30 MED ORDER — ORAL CARE MOUTH RINSE: 15.0000 mL | Freq: Once | OROMUCOSAL | Status: AC

## 2021-05-30 MED ORDER — CHLORHEXIDINE GLUCONATE 4 % EX LIQD: 60.0000 mL | Freq: Once | CUTANEOUS | Status: DC

## 2021-05-30 MED ORDER — FENTANYL CITRATE (PF) 250 MCG/5ML IJ SOLN
INTRAMUSCULAR | Status: AC
  Filled 2021-05-30: qty 5

## 2021-05-30 MED ORDER — SODIUM CHLORIDE 0.9 % IV SOLN: INTRAVENOUS | Status: DC

## 2021-05-30 MED ORDER — BUPIVACAINE HCL (PF) 0.25 % IJ SOLN
INTRAMUSCULAR | Status: DC | PRN
  Administered 2021-05-30: 10 mL

## 2021-05-30 MED ORDER — SUGAMMADEX SODIUM 200 MG/2ML IV SOLN
INTRAVENOUS | Status: DC | PRN
  Administered 2021-05-30: 400 mg via INTRAVENOUS

## 2021-05-30 MED ORDER — CEFAZOLIN SODIUM-DEXTROSE 2-4 GM/100ML-% IV SOLN
2.0000 g | INTRAVENOUS | Status: AC
  Administered 2021-05-30: 2 g via INTRAVENOUS
  Filled 2021-05-30: qty 100

## 2021-05-30 MED ORDER — CHLORHEXIDINE GLUCONATE 0.12 % MT SOLN
15.0000 mL | Freq: Once | OROMUCOSAL | Status: AC
  Administered 2021-05-30: 15 mL via OROMUCOSAL
  Filled 2021-05-30: qty 15

## 2021-05-30 MED ORDER — ALBUMIN HUMAN 5 % IV SOLN
INTRAVENOUS | Status: DC | PRN
Start: 2021-05-30 — End: 2021-05-30

## 2021-05-30 MED ORDER — CARVEDILOL 12.5 MG PO TABS
ORAL_TABLET | ORAL | Status: AC
  Administered 2021-05-30: 25 mg via ORAL
  Filled 2021-05-30: qty 2

## 2021-05-30 MED ORDER — FENTANYL CITRATE (PF) 100 MCG/2ML IJ SOLN: 25.0000 ug | INTRAMUSCULAR | Status: DC | PRN

## 2021-05-30 MED ORDER — PHENYLEPHRINE 40 MCG/ML (10ML) SYRINGE FOR IV PUSH (FOR BLOOD PRESSURE SUPPORT)
PREFILLED_SYRINGE | INTRAVENOUS | Status: DC | PRN
  Administered 2021-05-30: 160 ug via INTRAVENOUS
  Administered 2021-05-30 (×2): 200 ug via INTRAVENOUS

## 2021-05-30 MED ORDER — FENTANYL CITRATE (PF) 250 MCG/5ML IJ SOLN
INTRAMUSCULAR | Status: DC | PRN
  Administered 2021-05-30: 50 ug via INTRAVENOUS

## 2021-05-30 MED ORDER — PHENYLEPHRINE HCL-NACL 20-0.9 MG/250ML-% IV SOLN
INTRAVENOUS | Status: DC | PRN
  Administered 2021-05-30: 20 ug/min via INTRAVENOUS

## 2021-05-30 MED ORDER — SODIUM CHLORIDE 0.9 % IR SOLN
Status: DC | PRN
  Administered 2021-05-30: 1000 mL

## 2021-05-30 MED ORDER — DEXAMETHASONE SODIUM PHOSPHATE 10 MG/ML IJ SOLN
INTRAMUSCULAR | Status: DC | PRN
  Administered 2021-05-30: 5 mg via INTRAVENOUS

## 2021-05-30 MED ORDER — ROCURONIUM BROMIDE 10 MG/ML (PF) SYRINGE
PREFILLED_SYRINGE | INTRAVENOUS | Status: DC | PRN
  Administered 2021-05-30: 90 mg via INTRAVENOUS

## 2021-05-30 MED ORDER — BUPIVACAINE HCL (PF) 0.25 % IJ SOLN
INTRAMUSCULAR | Status: AC
  Filled 2021-05-30: qty 30

## 2021-05-30 MED ORDER — PROPOFOL 10 MG/ML IV BOLUS
INTRAVENOUS | Status: DC | PRN
  Administered 2021-05-30: 100 mg via INTRAVENOUS

## 2021-05-30 MED ORDER — MIDAZOLAM HCL 2 MG/2ML IJ SOLN
INTRAMUSCULAR | Status: AC
  Filled 2021-05-30: qty 2

## 2021-05-30 MED ORDER — 0.9 % SODIUM CHLORIDE (POUR BTL) OPTIME
TOPICAL | Status: DC | PRN
Start: 2021-05-30 — End: 2021-05-30
  Administered 2021-05-30: 1000 mL

## 2021-05-30 MED ORDER — ONDANSETRON HCL 4 MG/2ML IJ SOLN
INTRAMUSCULAR | Status: DC | PRN
  Administered 2021-05-30: 4 mg via INTRAVENOUS

## 2021-05-30 MED ORDER — MIDAZOLAM HCL 2 MG/2ML IJ SOLN
INTRAMUSCULAR | Status: DC | PRN
  Administered 2021-05-30: 2 mg via INTRAVENOUS

## 2021-05-30 MED ORDER — BUPIVACAINE-EPINEPHRINE 0.25% -1:200000 IJ SOLN: INTRAMUSCULAR | Status: DC | PRN

## 2021-05-30 MED ORDER — EPHEDRINE SULFATE-NACL 50-0.9 MG/10ML-% IV SOSY
PREFILLED_SYRINGE | INTRAVENOUS | Status: DC | PRN
  Administered 2021-05-30: 10 mg via INTRAVENOUS

## 2021-05-30 MED ORDER — OXYCODONE-ACETAMINOPHEN 10-325 MG PO TABS: 1.0000 | ORAL_TABLET | Freq: Four times a day (QID) | ORAL | 0 refills | Status: DC | PRN

## 2021-05-30 MED ORDER — CARVEDILOL 12.5 MG PO TABS: 25.0000 mg | ORAL_TABLET | Freq: Once | ORAL | Status: AC

## 2021-05-30 MED ORDER — LIDOCAINE 2% (20 MG/ML) 5 ML SYRINGE
INTRAMUSCULAR | Status: DC | PRN
  Administered 2021-05-30: 50 mg via INTRAVENOUS

## 2021-05-30 SURGICAL SUPPLY — 43 items
ADAPTER TITANIUM MEDIONICS (MISCELLANEOUS) ×2 IMPLANT
BAG DECANTER FOR FLEXI CONT (MISCELLANEOUS) ×2 IMPLANT
BIOPATCH RED 1 DISK 7.0 (GAUZE/BANDAGES/DRESSINGS) ×2 IMPLANT
BLADE CLIPPER SURG (BLADE) IMPLANT
CATH EXTENDED DIALYSIS (CATHETERS) ×2 IMPLANT
CHLORAPREP W/TINT 26 (MISCELLANEOUS) ×2 IMPLANT
COVER SURGICAL LIGHT HANDLE (MISCELLANEOUS) ×2 IMPLANT
DECANTER SPIKE VIAL GLASS SM (MISCELLANEOUS) ×2 IMPLANT
DERMABOND ADVANCED (GAUZE/BANDAGES/DRESSINGS) ×1
DERMABOND ADVANCED .7 DNX12 (GAUZE/BANDAGES/DRESSINGS) ×1 IMPLANT
DEVICE TROCAR PUNCTURE CLOSURE (ENDOMECHANICALS) ×2 IMPLANT
DRSG TEGADERM 4X10 (GAUZE/BANDAGES/DRESSINGS) ×2 IMPLANT
ELECT REM PT RETURN 9FT ADLT (ELECTROSURGICAL) ×2
ELECTRODE REM PT RTRN 9FT ADLT (ELECTROSURGICAL) ×1 IMPLANT
GAUZE SPONGE 4X4 12PLY STRL (GAUZE/BANDAGES/DRESSINGS) ×2 IMPLANT
GOWN STRL REUS W/ TWL LRG LVL3 (GOWN DISPOSABLE) ×3 IMPLANT
GOWN STRL REUS W/TWL LRG LVL3 (GOWN DISPOSABLE) ×3
KIT BASIN OR (CUSTOM PROCEDURE TRAY) ×2 IMPLANT
KIT TURNOVER KIT B (KITS) ×2 IMPLANT
NEEDLE INSUFFLATION 14GA 120MM (NEEDLE) ×2 IMPLANT
NS IRRIG 1000ML POUR BTL (IV SOLUTION) ×2 IMPLANT
PAD ARMBOARD 7.5X6 YLW CONV (MISCELLANEOUS) ×4 IMPLANT
SCISSORS LAP 5X35 DISP (ENDOMECHANICALS) ×2 IMPLANT
SET CYSTO W/LG BORE CLAMP LF (SET/KITS/TRAYS/PACK) ×2 IMPLANT
SET EXT 12IN DIALYSIS STAY-SAF (MISCELLANEOUS) ×2 IMPLANT
SET TUBE SMOKE EVAC HIGH FLOW (TUBING) ×2 IMPLANT
SLEEVE ENDOPATH XCEL 5M (ENDOMECHANICALS) ×4 IMPLANT
STYLET FALLER (MISCELLANEOUS) ×2 IMPLANT
SUT MNCRL AB 4-0 PS2 18 (SUTURE) ×2 IMPLANT
SUT PROLENE 0 CT 1 30 (SUTURE) ×4 IMPLANT
SUT PROLENE 0 SH 30 (SUTURE) ×2 IMPLANT
SUT SILK 2 0 SH (SUTURE) IMPLANT
SUT SILK 3 0 SH 30 (SUTURE) IMPLANT
SUT VIC AB 3-0 SH 27 (SUTURE)
SUT VIC AB 3-0 SH 27X BRD (SUTURE) IMPLANT
TAPE CLOTH SURG 4X10 WHT LF (GAUZE/BANDAGES/DRESSINGS) ×2 IMPLANT
TOWEL GREEN STERILE (TOWEL DISPOSABLE) ×2 IMPLANT
TOWEL GREEN STERILE FF (TOWEL DISPOSABLE) ×2 IMPLANT
TRAY LAPAROSCOPIC MC (CUSTOM PROCEDURE TRAY) ×2 IMPLANT
TROCAR 5MMX150MM (TROCAR) ×2 IMPLANT
TROCAR XCEL 12X100 BLDLESS (ENDOMECHANICALS) IMPLANT
TROCAR XCEL NON-BLD 5MMX100MML (ENDOMECHANICALS) ×2 IMPLANT
WATER STERILE IRR 1000ML POUR (IV SOLUTION) ×2 IMPLANT

## 2021-05-30 NOTE — Anesthesia Procedure Notes (Signed)
Procedure Name: Intubation Date/Time: 05/30/2021 7:45 AM Performed by: Thelma Comp, CRNA Pre-anesthesia Checklist: Patient identified, Emergency Drugs available, Suction available and Patient being monitored Patient Re-evaluated:Patient Re-evaluated prior to induction Oxygen Delivery Method: Circle System Utilized Preoxygenation: Pre-oxygenation with 100% oxygen Induction Type: IV induction Ventilation: Mask ventilation without difficulty Laryngoscope Size: Mac and 4 Grade View: Grade I Tube type: Oral Tube size: 7.5 mm Number of attempts: 1 Airway Equipment and Method: Stylet Placement Confirmation: ETT inserted through vocal cords under direct vision, positive ETCO2 and breath sounds checked- equal and bilateral Secured at: 23 cm Tube secured with: Tape Dental Injury: Teeth and Oropharynx as per pre-operative assessment

## 2021-05-30 NOTE — Transfer of Care (Signed)
Immediate Anesthesia Transfer of Care Note  Patient: Albert Eaton  Procedure(s) Performed: LAPAROSCOPIC INSERTION CONTINUOUS AMBULATORY PERITONEAL DIALYSIS  (CAPD) CATHETER  Patient Location: PACU  Anesthesia Type:General  Level of Consciousness: awake, alert  and patient cooperative  Airway & Oxygen Therapy: Patient Spontanous Breathing  Post-op Assessment: Report given to RN and Post -op Vital signs reviewed and stable  Post vital signs: Reviewed and stable  Last Vitals:  Vitals Value Taken Time  BP 107/75 05/30/21 0855  Temp 36.7 C 05/30/21 0855  Pulse 84 05/30/21 0859  Resp 20 05/30/21 0859  SpO2 94 % 05/30/21 0859  Vitals shown include unvalidated device data.  Last Pain:  Vitals:   05/30/21 0605  TempSrc:   PainSc: 0-No pain         Complications: No notable events documented.

## 2021-05-30 NOTE — Interval H&P Note (Signed)
History and Physical Interval Note:  05/30/2021 7:13 AM  Albert Eaton  has presented today for surgery, with the diagnosis of ESRD.  The various methods of treatment have been discussed with the patient and family. After consideration of risks, benefits and other options for treatment, the patient has consented to  Procedure(s): [place]  (CAPD) CATHETER (N/A) as a surgical intervention.  The patient's history has been reviewed, patient examined, no change in status, stable for surgery.  I have reviewed the patient's chart and labs.  Questions were answered to the patient's satisfaction.     [name]

## 2021-05-30 NOTE — Op Note (Signed)
DATE OF SERVICE: 05/30/2021  PATIENT:  Albert Eaton  55 y.o. male  PRE-OPERATIVE DIAGNOSIS:  ESRD  POST-OPERATIVE DIAGNOSIS:  Same  PROCEDURE:   1) laparoscopic lysis of adhesions 2) laparoscopic peritoneal dialysis catheter placement  SURGEON:  Surgeon(s) and Role:    * [name], MD - Primary    * Waynetta Sandy, MD - Assisting  ASSISTANT: Servando Snare, MD  An assistant was required to facilitate exposure and expedite the case.  ANESTHESIA:   general  EBL: min  BLOOD ADMINISTERED:none  DRAINS:  PD catheter to LUQ    LOCAL MEDICATIONS USED:  LIDOCAINE   SPECIMEN:  none  COUNTS: confirmed correct .  TOURNIQUET:  none  PATIENT DISPOSITION:  PACU - hemodynamically stable.   Delay start of Pharmacological VTE agent (>24hrs) due to surgical blood loss or risk of bleeding: no  INDICATION FOR PROCEDURE: Jassen Sarver Chain is a 55 y.o. male with ESRD.  He has a functioning arteriovenous fistula, but prefers to get dialysis at home. After careful discussion of risks, benefits, and alternatives the patient was offered laparoscopic peritoneal dialysis catheter. We specifically discussed risk of infection and failure. The patient understood and wished to proceed.  OPERATIVE FINDINGS: adhesions lysed sharply without difficulty. PD catheter functioning well upon completion.  DESCRIPTION OF PROCEDURE: After identification of the patient in the pre-operative holding area, the patient was transferred to the operating room. The patient was positioned supine on the operating room table. Anesthesia was induced. The abdomen was prepped and draped in standard fashion. A surgical pause was performed confirming correct patient, procedure, and operative location.  A Veress needle was introduced at Palmer's point.  A saline drop test was performed to confirm intra-abdominal position.  Insufflation tubing was connected to the needle.  Good flow and pressure was noted.  The abdomen  and insufflated without difficulty.  Using VasoView technique a right upper quadrant 5 mm trocar was placed under direct vision with the 0 degree scope.  After entering the abdomen I confirmed there was no evidence of Veress needle injury.  An additional 5 mm right upper quadrant trocar was placed under direct visualization with a 30 degree scope.  Small focus of adhesions were noted to the previous laparoscopic nephrectomy scar.  These were lysed sharply with endoscopic scissors.  The catheter was measured on the abdomen and the cuff planned to be positioned in the rectus sheath.  Incision was made about the umbilicus and a long 5 mm trocar was skived through the abdominal wall to exit near the pelvis.  The working, pigtail end of the catheter was delivered into the pelvis through the trocar.  The cuff was positioned in the rectus muscle.  I then connected the PD tubing to "swan-neck" extension tubing using a Christmas tree adapter secured with 2-0 Prolene sutures.  I attempted to perform omentopexy.  His omentum was diminutive and would not reach the abdominal wall.  I felt it likely would not cause any trouble with the catheter.  Using a sharp tunneling device the catheter was delivered to a counterincision in the subxiphoid abdomen and then out the abdominal wall in the left upper quadrant.  The catheter was tested using cystoscopy tubing and 1 L of bag of normal saline.  This flowed without difficulty into the abdomen and out of the abdomen.  Incision was closed using Monocryl and Dermabond.  A sterile bandage was applied to the PD catheter.  Upon completion of the case instrument and  sharps counts were confirmed correct. The patient was transferred to the PACU in good condition. I was present for all portions of the procedure.  [name]. Stanford Breed, MD [facility] Phone Number: [phone] 05/30/2021 8:41 AM

## 2021-05-30 NOTE — Anesthesia Postprocedure Evaluation (Signed)
Anesthesia Post Note  Patient: Albert Eaton  Procedure(s) Performed: LAPAROSCOPIC INSERTION CONTINUOUS AMBULATORY PERITONEAL DIALYSIS  (CAPD) CATHETER     Patient location during evaluation: PACU Anesthesia Type: General Level of consciousness: awake and alert Pain management: pain level controlled Vital Signs Assessment: post-procedure vital signs reviewed and stable Respiratory status: spontaneous breathing, nonlabored ventilation, respiratory function stable and patient connected to nasal cannula oxygen Cardiovascular status: blood pressure returned to baseline and stable Postop Assessment: no apparent nausea or vomiting Anesthetic complications: no   No notable events documented.  Last Vitals:  Vitals:   05/30/21 0910 05/30/21 0925  BP: 97/72 97/71  Pulse: 80 78  Resp: 12 14  Temp:  36.7 C  SpO2: 93% 98%    Last Pain:  Vitals:   05/30/21 0925  TempSrc:   PainSc: 0-No pain                 Muslima Toppins L Tharon Kitch

## 2021-05-31 ENCOUNTER — Encounter (HOSPITAL_COMMUNITY): Payer: Self-pay | Admitting: Vascular Surgery

## 2021-05-31 DIAGNOSIS — Z4931 Encounter for adequacy testing for hemodialysis: Secondary | ICD-10-CM | POA: Diagnosis not present

## 2021-05-31 DIAGNOSIS — Z79899 Other long term (current) drug therapy: Secondary | ICD-10-CM | POA: Diagnosis not present

## 2021-05-31 DIAGNOSIS — D509 Iron deficiency anemia, unspecified: Secondary | ICD-10-CM | POA: Diagnosis not present

## 2021-05-31 DIAGNOSIS — E876 Hypokalemia: Secondary | ICD-10-CM | POA: Diagnosis not present

## 2021-05-31 DIAGNOSIS — N186 End stage renal disease: Secondary | ICD-10-CM | POA: Diagnosis not present

## 2021-05-31 DIAGNOSIS — R82998 Other abnormal findings in urine: Secondary | ICD-10-CM | POA: Diagnosis not present

## 2021-05-31 DIAGNOSIS — E8770 Fluid overload, unspecified: Secondary | ICD-10-CM | POA: Diagnosis not present

## 2021-05-31 DIAGNOSIS — D631 Anemia in chronic kidney disease: Secondary | ICD-10-CM | POA: Diagnosis not present

## 2021-05-31 DIAGNOSIS — Z992 Dependence on renal dialysis: Secondary | ICD-10-CM | POA: Diagnosis not present

## 2021-05-31 DIAGNOSIS — N2581 Secondary hyperparathyroidism of renal origin: Secondary | ICD-10-CM | POA: Diagnosis not present

## 2021-05-31 DIAGNOSIS — K7689 Other specified diseases of liver: Secondary | ICD-10-CM | POA: Diagnosis not present

## 2021-05-31 DIAGNOSIS — E440 Moderate protein-calorie malnutrition: Secondary | ICD-10-CM | POA: Diagnosis not present

## 2021-06-03 DIAGNOSIS — Z79899 Other long term (current) drug therapy: Secondary | ICD-10-CM | POA: Diagnosis not present

## 2021-06-03 DIAGNOSIS — E440 Moderate protein-calorie malnutrition: Secondary | ICD-10-CM | POA: Diagnosis not present

## 2021-06-03 DIAGNOSIS — N186 End stage renal disease: Secondary | ICD-10-CM | POA: Diagnosis not present

## 2021-06-03 DIAGNOSIS — E8770 Fluid overload, unspecified: Secondary | ICD-10-CM | POA: Diagnosis not present

## 2021-06-03 DIAGNOSIS — E876 Hypokalemia: Secondary | ICD-10-CM | POA: Diagnosis not present

## 2021-06-03 DIAGNOSIS — K7689 Other specified diseases of liver: Secondary | ICD-10-CM | POA: Diagnosis not present

## 2021-06-03 DIAGNOSIS — D509 Iron deficiency anemia, unspecified: Secondary | ICD-10-CM | POA: Diagnosis not present

## 2021-06-03 DIAGNOSIS — D631 Anemia in chronic kidney disease: Secondary | ICD-10-CM | POA: Diagnosis not present

## 2021-06-03 DIAGNOSIS — N2581 Secondary hyperparathyroidism of renal origin: Secondary | ICD-10-CM | POA: Diagnosis not present

## 2021-06-03 DIAGNOSIS — R82998 Other abnormal findings in urine: Secondary | ICD-10-CM | POA: Diagnosis not present

## 2021-06-03 DIAGNOSIS — Z4931 Encounter for adequacy testing for hemodialysis: Secondary | ICD-10-CM | POA: Diagnosis not present

## 2021-06-03 DIAGNOSIS — Z992 Dependence on renal dialysis: Secondary | ICD-10-CM | POA: Diagnosis not present

## 2021-06-04 DIAGNOSIS — D631 Anemia in chronic kidney disease: Secondary | ICD-10-CM | POA: Diagnosis not present

## 2021-06-04 DIAGNOSIS — R82998 Other abnormal findings in urine: Secondary | ICD-10-CM | POA: Diagnosis not present

## 2021-06-04 DIAGNOSIS — N186 End stage renal disease: Secondary | ICD-10-CM | POA: Diagnosis not present

## 2021-06-04 DIAGNOSIS — D509 Iron deficiency anemia, unspecified: Secondary | ICD-10-CM | POA: Diagnosis not present

## 2021-06-04 DIAGNOSIS — E440 Moderate protein-calorie malnutrition: Secondary | ICD-10-CM | POA: Diagnosis not present

## 2021-06-04 DIAGNOSIS — N2581 Secondary hyperparathyroidism of renal origin: Secondary | ICD-10-CM | POA: Diagnosis not present

## 2021-06-04 DIAGNOSIS — K7689 Other specified diseases of liver: Secondary | ICD-10-CM | POA: Diagnosis not present

## 2021-06-04 DIAGNOSIS — Z4931 Encounter for adequacy testing for hemodialysis: Secondary | ICD-10-CM | POA: Diagnosis not present

## 2021-06-04 DIAGNOSIS — E876 Hypokalemia: Secondary | ICD-10-CM | POA: Diagnosis not present

## 2021-06-04 DIAGNOSIS — Z992 Dependence on renal dialysis: Secondary | ICD-10-CM | POA: Diagnosis not present

## 2021-06-04 DIAGNOSIS — Z79899 Other long term (current) drug therapy: Secondary | ICD-10-CM | POA: Diagnosis not present

## 2021-06-04 DIAGNOSIS — E8770 Fluid overload, unspecified: Secondary | ICD-10-CM | POA: Diagnosis not present

## 2021-06-05 ENCOUNTER — Other Ambulatory Visit: Payer: Self-pay | Admitting: Allergy & Immunology

## 2021-06-05 DIAGNOSIS — K7689 Other specified diseases of liver: Secondary | ICD-10-CM | POA: Diagnosis not present

## 2021-06-05 DIAGNOSIS — Z992 Dependence on renal dialysis: Secondary | ICD-10-CM | POA: Diagnosis not present

## 2021-06-05 DIAGNOSIS — N186 End stage renal disease: Secondary | ICD-10-CM | POA: Diagnosis not present

## 2021-06-05 DIAGNOSIS — E876 Hypokalemia: Secondary | ICD-10-CM | POA: Diagnosis not present

## 2021-06-05 DIAGNOSIS — N2581 Secondary hyperparathyroidism of renal origin: Secondary | ICD-10-CM | POA: Diagnosis not present

## 2021-06-05 DIAGNOSIS — E8770 Fluid overload, unspecified: Secondary | ICD-10-CM | POA: Diagnosis not present

## 2021-06-05 DIAGNOSIS — D509 Iron deficiency anemia, unspecified: Secondary | ICD-10-CM | POA: Diagnosis not present

## 2021-06-05 DIAGNOSIS — R82998 Other abnormal findings in urine: Secondary | ICD-10-CM | POA: Diagnosis not present

## 2021-06-05 DIAGNOSIS — Z4931 Encounter for adequacy testing for hemodialysis: Secondary | ICD-10-CM | POA: Diagnosis not present

## 2021-06-05 DIAGNOSIS — E440 Moderate protein-calorie malnutrition: Secondary | ICD-10-CM | POA: Diagnosis not present

## 2021-06-05 DIAGNOSIS — Z79899 Other long term (current) drug therapy: Secondary | ICD-10-CM | POA: Diagnosis not present

## 2021-06-05 DIAGNOSIS — D631 Anemia in chronic kidney disease: Secondary | ICD-10-CM | POA: Diagnosis not present

## 2021-06-07 DIAGNOSIS — N2581 Secondary hyperparathyroidism of renal origin: Secondary | ICD-10-CM | POA: Diagnosis not present

## 2021-06-07 DIAGNOSIS — E876 Hypokalemia: Secondary | ICD-10-CM | POA: Diagnosis not present

## 2021-06-07 DIAGNOSIS — N186 End stage renal disease: Secondary | ICD-10-CM | POA: Diagnosis not present

## 2021-06-07 DIAGNOSIS — D631 Anemia in chronic kidney disease: Secondary | ICD-10-CM | POA: Diagnosis not present

## 2021-06-07 DIAGNOSIS — D509 Iron deficiency anemia, unspecified: Secondary | ICD-10-CM | POA: Diagnosis not present

## 2021-06-07 DIAGNOSIS — R82998 Other abnormal findings in urine: Secondary | ICD-10-CM | POA: Diagnosis not present

## 2021-06-07 DIAGNOSIS — E8770 Fluid overload, unspecified: Secondary | ICD-10-CM | POA: Diagnosis not present

## 2021-06-07 DIAGNOSIS — Z79899 Other long term (current) drug therapy: Secondary | ICD-10-CM | POA: Diagnosis not present

## 2021-06-07 DIAGNOSIS — Z4931 Encounter for adequacy testing for hemodialysis: Secondary | ICD-10-CM | POA: Diagnosis not present

## 2021-06-07 DIAGNOSIS — E440 Moderate protein-calorie malnutrition: Secondary | ICD-10-CM | POA: Diagnosis not present

## 2021-06-07 DIAGNOSIS — K7689 Other specified diseases of liver: Secondary | ICD-10-CM | POA: Diagnosis not present

## 2021-06-07 DIAGNOSIS — Z992 Dependence on renal dialysis: Secondary | ICD-10-CM | POA: Diagnosis not present

## 2021-06-10 ENCOUNTER — Other Ambulatory Visit: Payer: Self-pay | Admitting: Infectious Diseases

## 2021-06-10 DIAGNOSIS — E440 Moderate protein-calorie malnutrition: Secondary | ICD-10-CM | POA: Diagnosis not present

## 2021-06-10 DIAGNOSIS — B20 Human immunodeficiency virus [HIV] disease: Secondary | ICD-10-CM

## 2021-06-10 DIAGNOSIS — N2581 Secondary hyperparathyroidism of renal origin: Secondary | ICD-10-CM | POA: Diagnosis not present

## 2021-06-10 DIAGNOSIS — E876 Hypokalemia: Secondary | ICD-10-CM | POA: Diagnosis not present

## 2021-06-10 DIAGNOSIS — N186 End stage renal disease: Secondary | ICD-10-CM | POA: Diagnosis not present

## 2021-06-10 DIAGNOSIS — D631 Anemia in chronic kidney disease: Secondary | ICD-10-CM | POA: Diagnosis not present

## 2021-06-10 DIAGNOSIS — Z4931 Encounter for adequacy testing for hemodialysis: Secondary | ICD-10-CM | POA: Diagnosis not present

## 2021-06-10 DIAGNOSIS — E8770 Fluid overload, unspecified: Secondary | ICD-10-CM | POA: Diagnosis not present

## 2021-06-10 DIAGNOSIS — Z79899 Other long term (current) drug therapy: Secondary | ICD-10-CM | POA: Diagnosis not present

## 2021-06-10 DIAGNOSIS — R82998 Other abnormal findings in urine: Secondary | ICD-10-CM | POA: Diagnosis not present

## 2021-06-10 DIAGNOSIS — K7689 Other specified diseases of liver: Secondary | ICD-10-CM | POA: Diagnosis not present

## 2021-06-10 DIAGNOSIS — D509 Iron deficiency anemia, unspecified: Secondary | ICD-10-CM | POA: Diagnosis not present

## 2021-06-10 DIAGNOSIS — Z992 Dependence on renal dialysis: Secondary | ICD-10-CM | POA: Diagnosis not present

## 2021-06-12 DIAGNOSIS — D631 Anemia in chronic kidney disease: Secondary | ICD-10-CM | POA: Diagnosis not present

## 2021-06-12 DIAGNOSIS — N186 End stage renal disease: Secondary | ICD-10-CM | POA: Diagnosis not present

## 2021-06-12 DIAGNOSIS — E8770 Fluid overload, unspecified: Secondary | ICD-10-CM | POA: Diagnosis not present

## 2021-06-12 DIAGNOSIS — E440 Moderate protein-calorie malnutrition: Secondary | ICD-10-CM | POA: Diagnosis not present

## 2021-06-12 DIAGNOSIS — N2581 Secondary hyperparathyroidism of renal origin: Secondary | ICD-10-CM | POA: Diagnosis not present

## 2021-06-12 DIAGNOSIS — Z992 Dependence on renal dialysis: Secondary | ICD-10-CM | POA: Diagnosis not present

## 2021-06-12 DIAGNOSIS — R82998 Other abnormal findings in urine: Secondary | ICD-10-CM | POA: Diagnosis not present

## 2021-06-12 DIAGNOSIS — E876 Hypokalemia: Secondary | ICD-10-CM | POA: Diagnosis not present

## 2021-06-12 DIAGNOSIS — D509 Iron deficiency anemia, unspecified: Secondary | ICD-10-CM | POA: Diagnosis not present

## 2021-06-12 DIAGNOSIS — Z79899 Other long term (current) drug therapy: Secondary | ICD-10-CM | POA: Diagnosis not present

## 2021-06-12 DIAGNOSIS — K7689 Other specified diseases of liver: Secondary | ICD-10-CM | POA: Diagnosis not present

## 2021-06-12 DIAGNOSIS — Z4931 Encounter for adequacy testing for hemodialysis: Secondary | ICD-10-CM | POA: Diagnosis not present

## 2021-06-13 ENCOUNTER — Other Ambulatory Visit: Payer: Self-pay | Admitting: Family Medicine

## 2021-06-13 DIAGNOSIS — M109 Gout, unspecified: Secondary | ICD-10-CM

## 2021-06-17 ENCOUNTER — Encounter (HOSPITAL_COMMUNITY): Payer: Self-pay

## 2021-06-17 ENCOUNTER — Emergency Department (HOSPITAL_COMMUNITY): Payer: Medicare Other

## 2021-06-17 ENCOUNTER — Other Ambulatory Visit: Payer: Self-pay

## 2021-06-17 ENCOUNTER — Emergency Department (HOSPITAL_COMMUNITY)
Admission: EM | Admit: 2021-06-17 | Discharge: 2021-06-17 | Disposition: A | Discharge status: Home / Self Care | Payer: Medicare Other | Attending: Emergency Medicine | Admitting: Emergency Medicine

## 2021-06-17 ENCOUNTER — Emergency Department (HOSPITAL_COMMUNITY)
Admission: EM | Admit: 2021-06-17 | Discharge: 2021-06-18 | Disposition: A | Discharge status: Home / Self Care | Payer: Medicare Other | Source: Home / Self Care | Attending: Emergency Medicine | Admitting: Emergency Medicine

## 2021-06-17 ENCOUNTER — Encounter (HOSPITAL_COMMUNITY): Payer: Self-pay | Admitting: *Deleted

## 2021-06-17 DIAGNOSIS — N189 Chronic kidney disease, unspecified: Secondary | ICD-10-CM | POA: Insufficient documentation

## 2021-06-17 DIAGNOSIS — Z85528 Personal history of other malignant neoplasm of kidney: Secondary | ICD-10-CM | POA: Insufficient documentation

## 2021-06-17 DIAGNOSIS — N3090 Cystitis, unspecified without hematuria: Secondary | ICD-10-CM | POA: Diagnosis not present

## 2021-06-17 DIAGNOSIS — Z87891 Personal history of nicotine dependence: Secondary | ICD-10-CM | POA: Insufficient documentation

## 2021-06-17 DIAGNOSIS — Z7951 Long term (current) use of inhaled steroids: Secondary | ICD-10-CM | POA: Insufficient documentation

## 2021-06-17 DIAGNOSIS — Z79899 Other long term (current) drug therapy: Secondary | ICD-10-CM | POA: Insufficient documentation

## 2021-06-17 DIAGNOSIS — Z20822 Contact with and (suspected) exposure to covid-19: Secondary | ICD-10-CM | POA: Insufficient documentation

## 2021-06-17 DIAGNOSIS — I132 Hypertensive heart and chronic kidney disease with heart failure and with stage 5 chronic kidney disease, or end stage renal disease: Secondary | ICD-10-CM | POA: Insufficient documentation

## 2021-06-17 DIAGNOSIS — K659 Peritonitis, unspecified: Secondary | ICD-10-CM

## 2021-06-17 DIAGNOSIS — I130 Hypertensive heart and chronic kidney disease with heart failure and stage 1 through stage 4 chronic kidney disease, or unspecified chronic kidney disease: Secondary | ICD-10-CM | POA: Insufficient documentation

## 2021-06-17 DIAGNOSIS — Z21 Asymptomatic human immunodeficiency virus [HIV] infection status: Secondary | ICD-10-CM | POA: Insufficient documentation

## 2021-06-17 DIAGNOSIS — I5032 Chronic diastolic (congestive) heart failure: Secondary | ICD-10-CM | POA: Insufficient documentation

## 2021-06-17 DIAGNOSIS — R0602 Shortness of breath: Secondary | ICD-10-CM

## 2021-06-17 DIAGNOSIS — E039 Hypothyroidism, unspecified: Secondary | ICD-10-CM | POA: Insufficient documentation

## 2021-06-17 DIAGNOSIS — N186 End stage renal disease: Secondary | ICD-10-CM | POA: Insufficient documentation

## 2021-06-17 DIAGNOSIS — I517 Cardiomegaly: Secondary | ICD-10-CM | POA: Diagnosis not present

## 2021-06-17 DIAGNOSIS — Z992 Dependence on renal dialysis: Secondary | ICD-10-CM | POA: Diagnosis not present

## 2021-06-17 DIAGNOSIS — R509 Fever, unspecified: Secondary | ICD-10-CM | POA: Diagnosis not present

## 2021-06-17 DIAGNOSIS — K652 Spontaneous bacterial peritonitis: Secondary | ICD-10-CM | POA: Insufficient documentation

## 2021-06-17 DIAGNOSIS — R519 Headache, unspecified: Secondary | ICD-10-CM | POA: Insufficient documentation

## 2021-06-17 DIAGNOSIS — N2581 Secondary hyperparathyroidism of renal origin: Secondary | ICD-10-CM | POA: Diagnosis not present

## 2021-06-17 DIAGNOSIS — D509 Iron deficiency anemia, unspecified: Secondary | ICD-10-CM | POA: Diagnosis not present

## 2021-06-17 DIAGNOSIS — J4520 Mild intermittent asthma, uncomplicated: Secondary | ICD-10-CM | POA: Insufficient documentation

## 2021-06-17 DIAGNOSIS — R112 Nausea with vomiting, unspecified: Secondary | ICD-10-CM | POA: Insufficient documentation

## 2021-06-17 DIAGNOSIS — Z7982 Long term (current) use of aspirin: Secondary | ICD-10-CM | POA: Insufficient documentation

## 2021-06-17 DIAGNOSIS — R197 Diarrhea, unspecified: Secondary | ICD-10-CM | POA: Diagnosis not present

## 2021-06-17 DIAGNOSIS — R140 Abdominal distension (gaseous): Secondary | ICD-10-CM | POA: Insufficient documentation

## 2021-06-17 DIAGNOSIS — R109 Unspecified abdominal pain: Secondary | ICD-10-CM | POA: Diagnosis not present

## 2021-06-17 DIAGNOSIS — D631 Anemia in chronic kidney disease: Secondary | ICD-10-CM | POA: Diagnosis not present

## 2021-06-17 DIAGNOSIS — J45909 Unspecified asthma, uncomplicated: Secondary | ICD-10-CM | POA: Insufficient documentation

## 2021-06-17 LAB — URINALYSIS, ROUTINE W REFLEX MICROSCOPIC
Bilirubin Urine: NEGATIVE
Glucose, UA: NEGATIVE mg/dL
Ketones, ur: NEGATIVE mg/dL
Leukocytes,Ua: NEGATIVE
Nitrite: NEGATIVE
Protein, ur: 100 mg/dL — AB
Specific Gravity, Urine: 1.009 (ref 1.005–1.030)
pH: 5 (ref 5.0–8.0)

## 2021-06-17 LAB — COMPREHENSIVE METABOLIC PANEL
ALT: 9 U/L (ref 0–44)
AST: 14 U/L — ABNORMAL LOW (ref 15–41)
Albumin: 4.1 g/dL (ref 3.5–5.0)
Alkaline Phosphatase: 72 U/L (ref 38–126)
Anion gap: 15 (ref 5–15)
BUN: 64 mg/dL — ABNORMAL HIGH (ref 6–20)
CO2: 20 mmol/L — ABNORMAL LOW (ref 22–32)
Calcium: 9.8 mg/dL (ref 8.9–10.3)
Chloride: 102 mmol/L (ref 98–111)
Creatinine, Ser: 14.5 mg/dL — ABNORMAL HIGH (ref 0.61–1.24)
GFR, Estimated: 4 mL/min — ABNORMAL LOW (ref >60)
Glucose, Bld: 98 mg/dL (ref 70–99)
Potassium: 3.4 mmol/L — ABNORMAL LOW (ref 3.5–5.1)
Sodium: 137 mmol/L (ref 135–145)
Total Bilirubin: 0.7 mg/dL (ref 0.3–1.2)
Total Protein: 7.3 g/dL (ref 6.5–8.1)

## 2021-06-17 LAB — CBC WITH DIFFERENTIAL/PLATELET
Abs Immature Granulocytes: 0.04 10*3/uL (ref 0.00–0.07)
Basophils Absolute: 0 10*3/uL (ref 0.0–0.1)
Basophils Relative: 0 %
Eosinophils Absolute: 0.2 10*3/uL (ref 0.0–0.5)
Eosinophils Relative: 2 %
HCT: 29.2 % — ABNORMAL LOW (ref 39.0–52.0)
Hemoglobin: 10 g/dL — ABNORMAL LOW (ref 13.0–17.0)
Immature Granulocytes: 1 %
Lymphocytes Relative: 18 %
Lymphs Abs: 1.5 10*3/uL (ref 0.7–4.0)
MCH: 32.7 pg (ref 26.0–34.0)
MCHC: 34.2 g/dL (ref 30.0–36.0)
MCV: 95.4 fL (ref 80.0–100.0)
Monocytes Absolute: 0.8 10*3/uL (ref 0.1–1.0)
Monocytes Relative: 10 %
Neutro Abs: 5.8 10*3/uL (ref 1.7–7.7)
Neutrophils Relative %: 69 %
Platelets: 203 10*3/uL (ref 150–400)
RBC: 3.06 MIL/uL — ABNORMAL LOW (ref 4.22–5.81)
RDW: 15.1 % (ref 11.5–15.5)
WBC: 8.4 10*3/uL (ref 4.0–10.5)
nRBC: 0 % (ref 0.0–0.2)

## 2021-06-17 LAB — BODY FLUID CELL COUNT WITH DIFFERENTIAL
Eos, Fluid: 17 %
Lymphs, Fluid: 39 %
Monocyte-Macrophage-Serous Fluid: 34 % — ABNORMAL LOW (ref 50–90)
Neutrophil Count, Fluid: 3 % (ref 0–25)
Total Nucleated Cell Count, Fluid: 15 cu mm (ref 0–1000)

## 2021-06-17 LAB — RESP PANEL BY RT-PCR (FLU A&B, COVID) ARPGX2
Influenza A by PCR: NEGATIVE
Influenza B by PCR: NEGATIVE
SARS Coronavirus 2 by RT PCR: NEGATIVE

## 2021-06-17 LAB — PROTIME-INR
INR: 1.2 (ref 0.8–1.2)
Prothrombin Time: 14.9 seconds (ref 11.4–15.2)

## 2021-06-17 LAB — LACTIC ACID, PLASMA: Lactic Acid, Venous: 1 mmol/L (ref 0.5–1.9)

## 2021-06-17 IMAGING — CT CT ABD-PELV W/ CM
2 of 5 series · 16 of 46 positions shown, 18 images · IV contrast (omnipaque)
Comparison: [DATE]

CLINICAL DATA: Right lower quadrant abdominal pain, fever, dyspnea

EXAM:
CT ABDOMEN AND PELVIS WITH CONTRAST
TECHNIQUE: Multidetector CT imaging of the abdomen and pelvis was performed
using the standard protocol following bolus administration of
intravenous contrast.
CONTRAST:  100mL OMNIPAQUE IOHEXOL 300 MG/ML  SOLN

[Series 3: a/p w/ 5mm · axial · 0.78mm/px · z∈[-489,-64]mm · 13 of 95 slices shown, 15 images]
[im 5/95  soft-tissue]
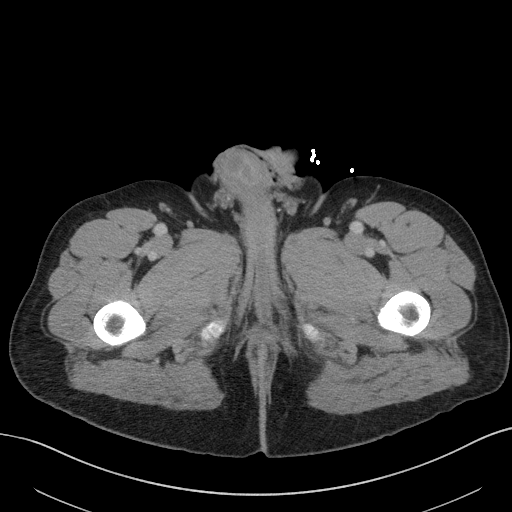
[im 5/95  bone]
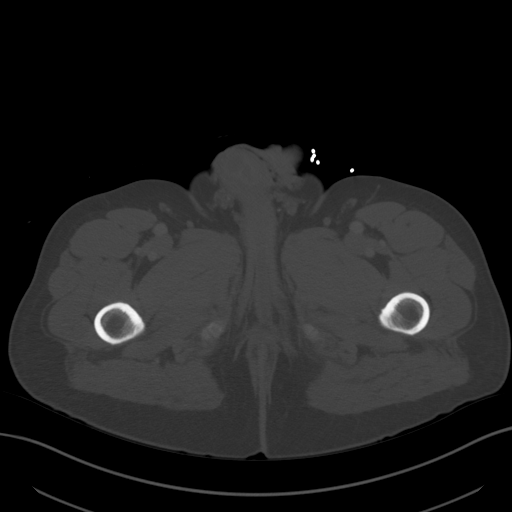
[im 15/95  soft-tissue]
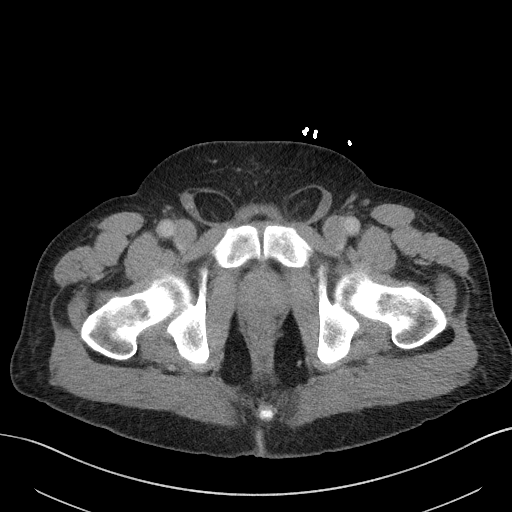
[im 19/95  soft-tissue]
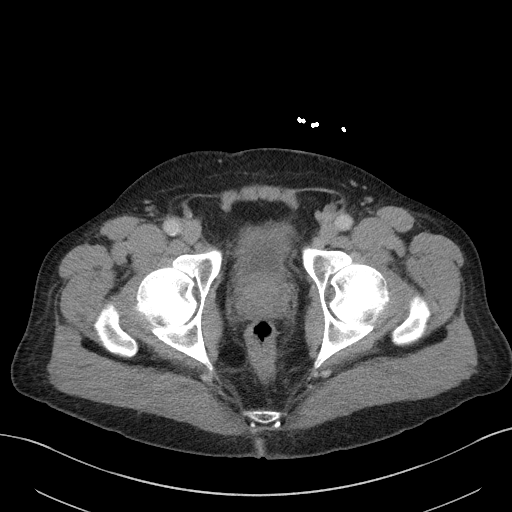
[im 29/95  soft-tissue]
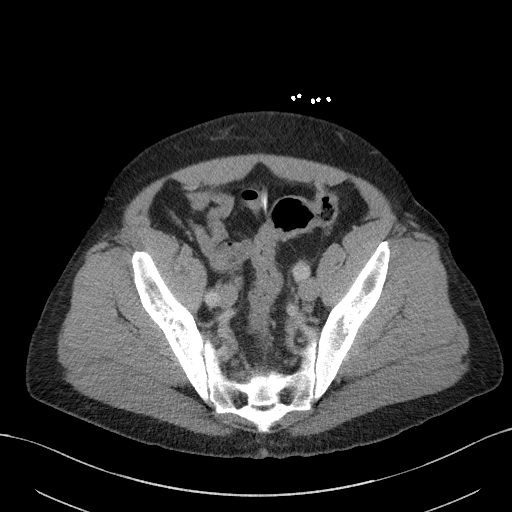
[im 33/95  soft-tissue]
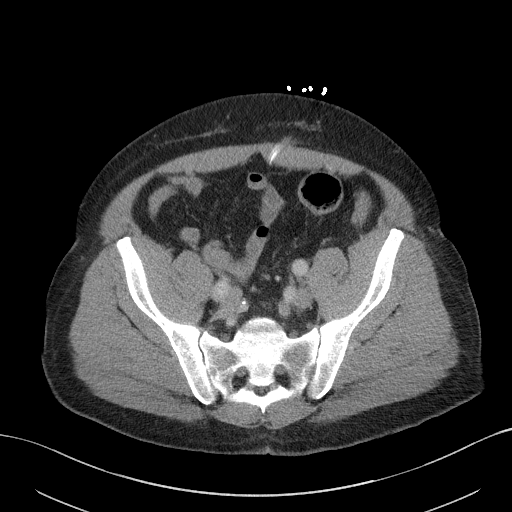
[im 43/95  soft-tissue]
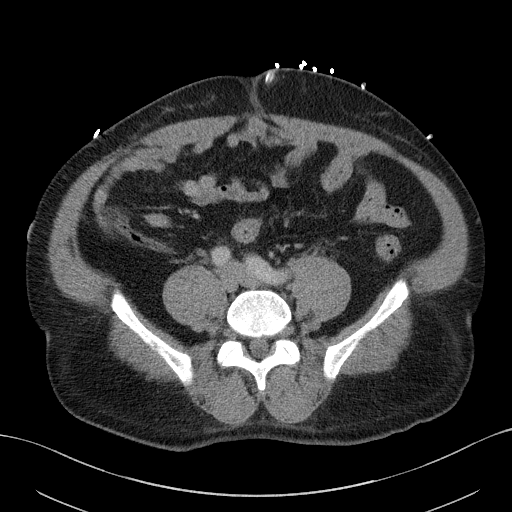
[im 48/95  soft-tissue]
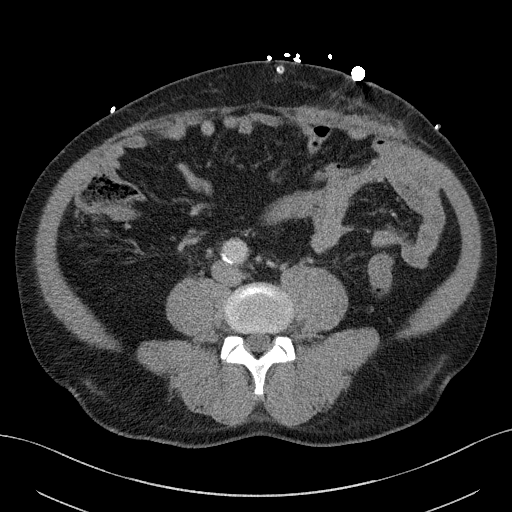
[im 52/95  soft-tissue]
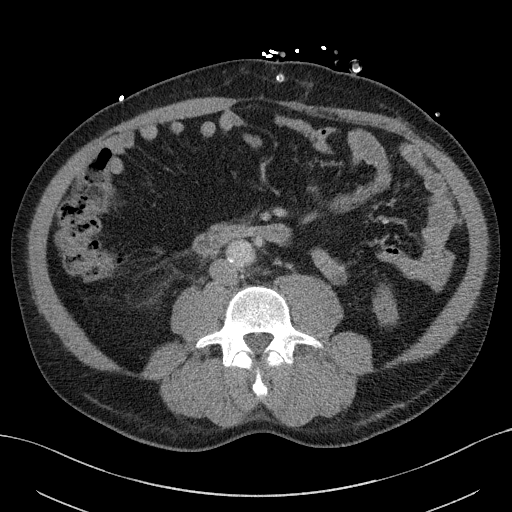
[im 62/95  soft-tissue]
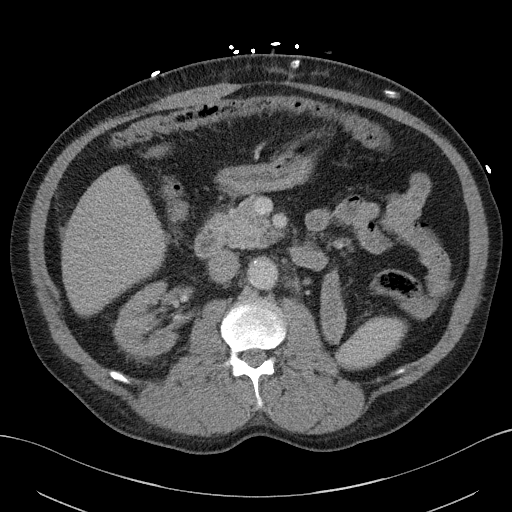
[im 62/95  bone]
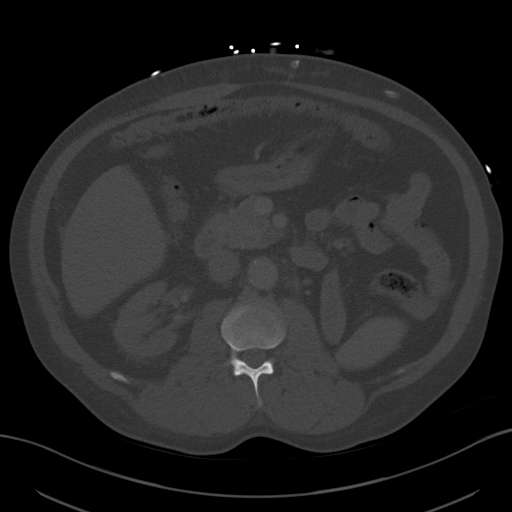
[im 66/95  soft-tissue]
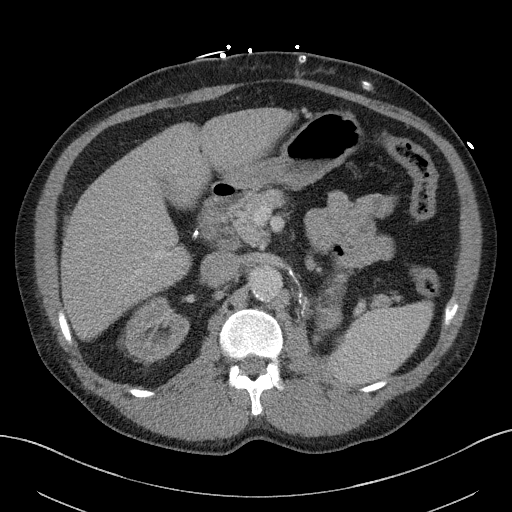
[im 76/95  soft-tissue]
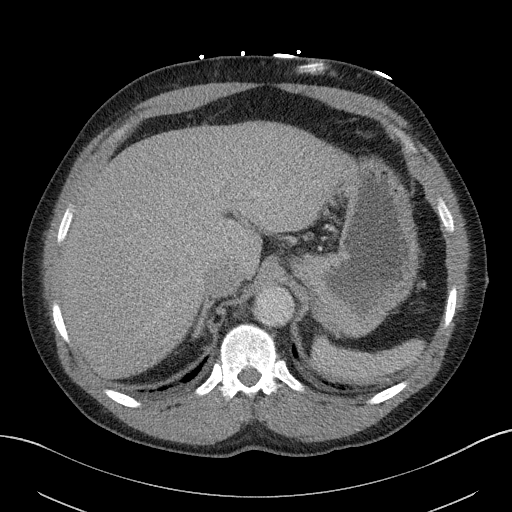
[im 80/95  soft-tissue]
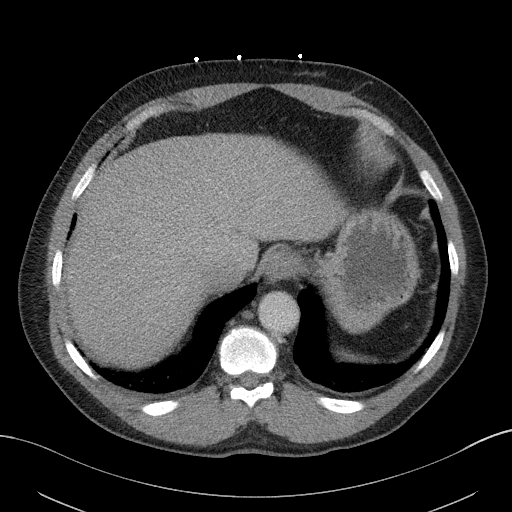
[im 90/95  soft-tissue]
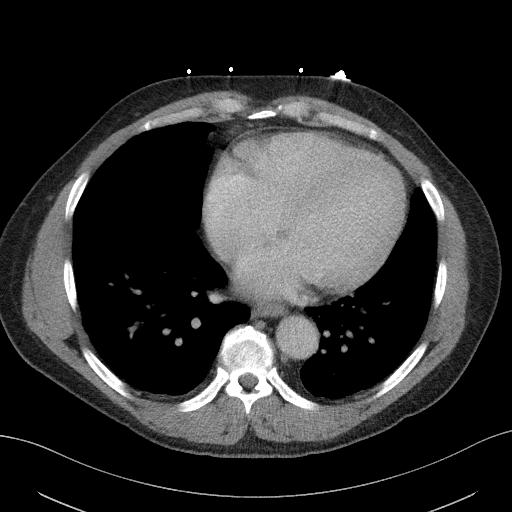

[Series 6: a/p w/ cor · coronal · 0.92mm/px · 3 of 174 slices shown]
[im 58/174  soft-tissue]
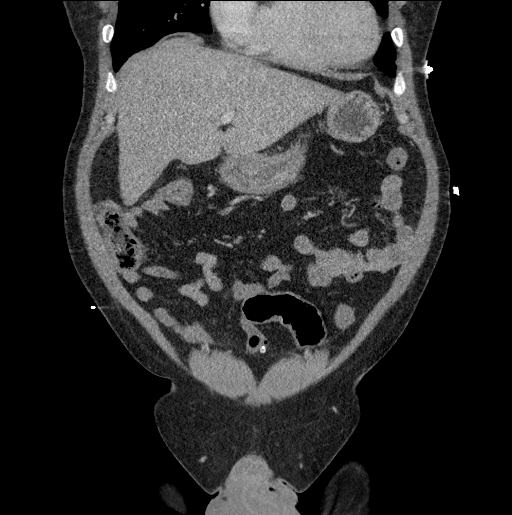
[im 77/174  soft-tissue]
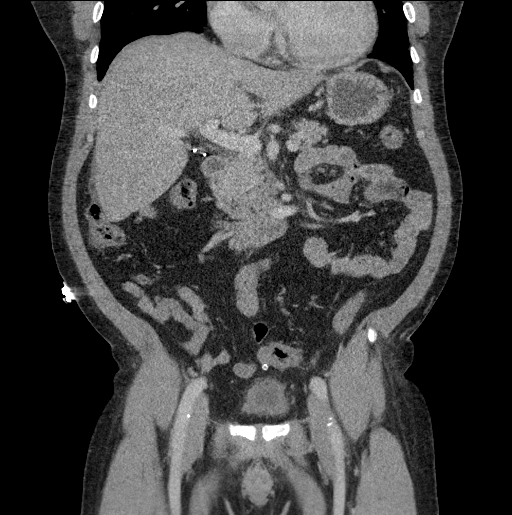
[im 97/174  soft-tissue]
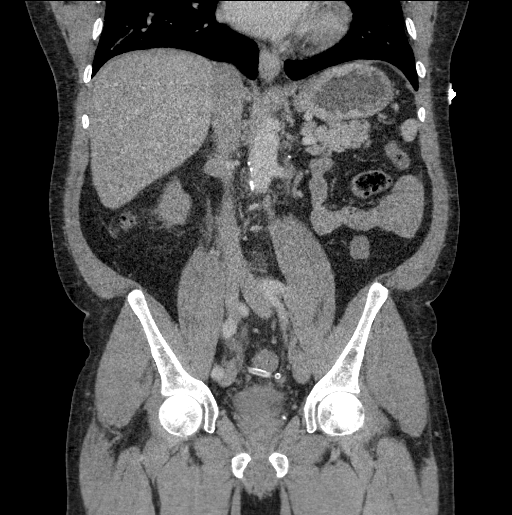

[16 of 46 positions shown; findings below may reference images not displayed]

FINDINGS: Lower chest: No acute abnormality.

Hepatobiliary: No focal liver abnormality is seen. Status post
cholecystectomy. No biliary dilatation.

Pancreas: Unremarkable

Spleen: Unremarkable

Adrenals/Urinary Tract: The adrenal glands are unremarkable. Status
post left nephrectomy. The right kidney is normal in position and is
mildly atrophic. Simple endophytic cortical cyst noted within the
upper pole. No hydronephrosis. No intrarenal or ureteral calculi.
The bladder is decompressed. Moderate circumferential perivesicular
inflammatory stranding is present suggesting a diffuse infectious or
inflammatory cystitis.

Stomach/Bowel: Stomach is within normal limits. Appendix appears
normal. No evidence of bowel wall thickening, distention, or
inflammatory changes. No free intraperitoneal gas or fluid.
Peritoneal dialysis catheter seen looped within the deep pelvis.

Vascular/Lymphatic: Mild aortoiliac atherosclerotic calcification.
No aortic aneurysm. No pathologic adenopathy within the abdomen and
pelvis.

Reproductive: Prostate is unremarkable.

Other: The small bilateral fat containing inguinal hernias are
present. Rectum unremarkable.

Musculoskeletal: No acute bone abnormality. No lytic or blastic bone
lesion.
IMPRESSION: Perivesicular inflammatory change suggesting a diffuse infectious or
inflammatory cystitis. Correlation with urinalysis and urine culture
may be helpful.

Normal appendix.

Status post left nephrectomy.

Peritoneal dialysis catheter in expected position within the deep
pelvis.

Aortic Atherosclerosis ([G9]-[G9]).

## 2021-06-17 IMAGING — DX DG CHEST 1V PORT
1 series · 1 of 1 positions shown · non-contrast
Comparison: Chest radiograph dated [DATE].

CLINICAL DATA: Fever and shortness of breath

EXAM:
PORTABLE CHEST 1 VIEW

[chest ap]
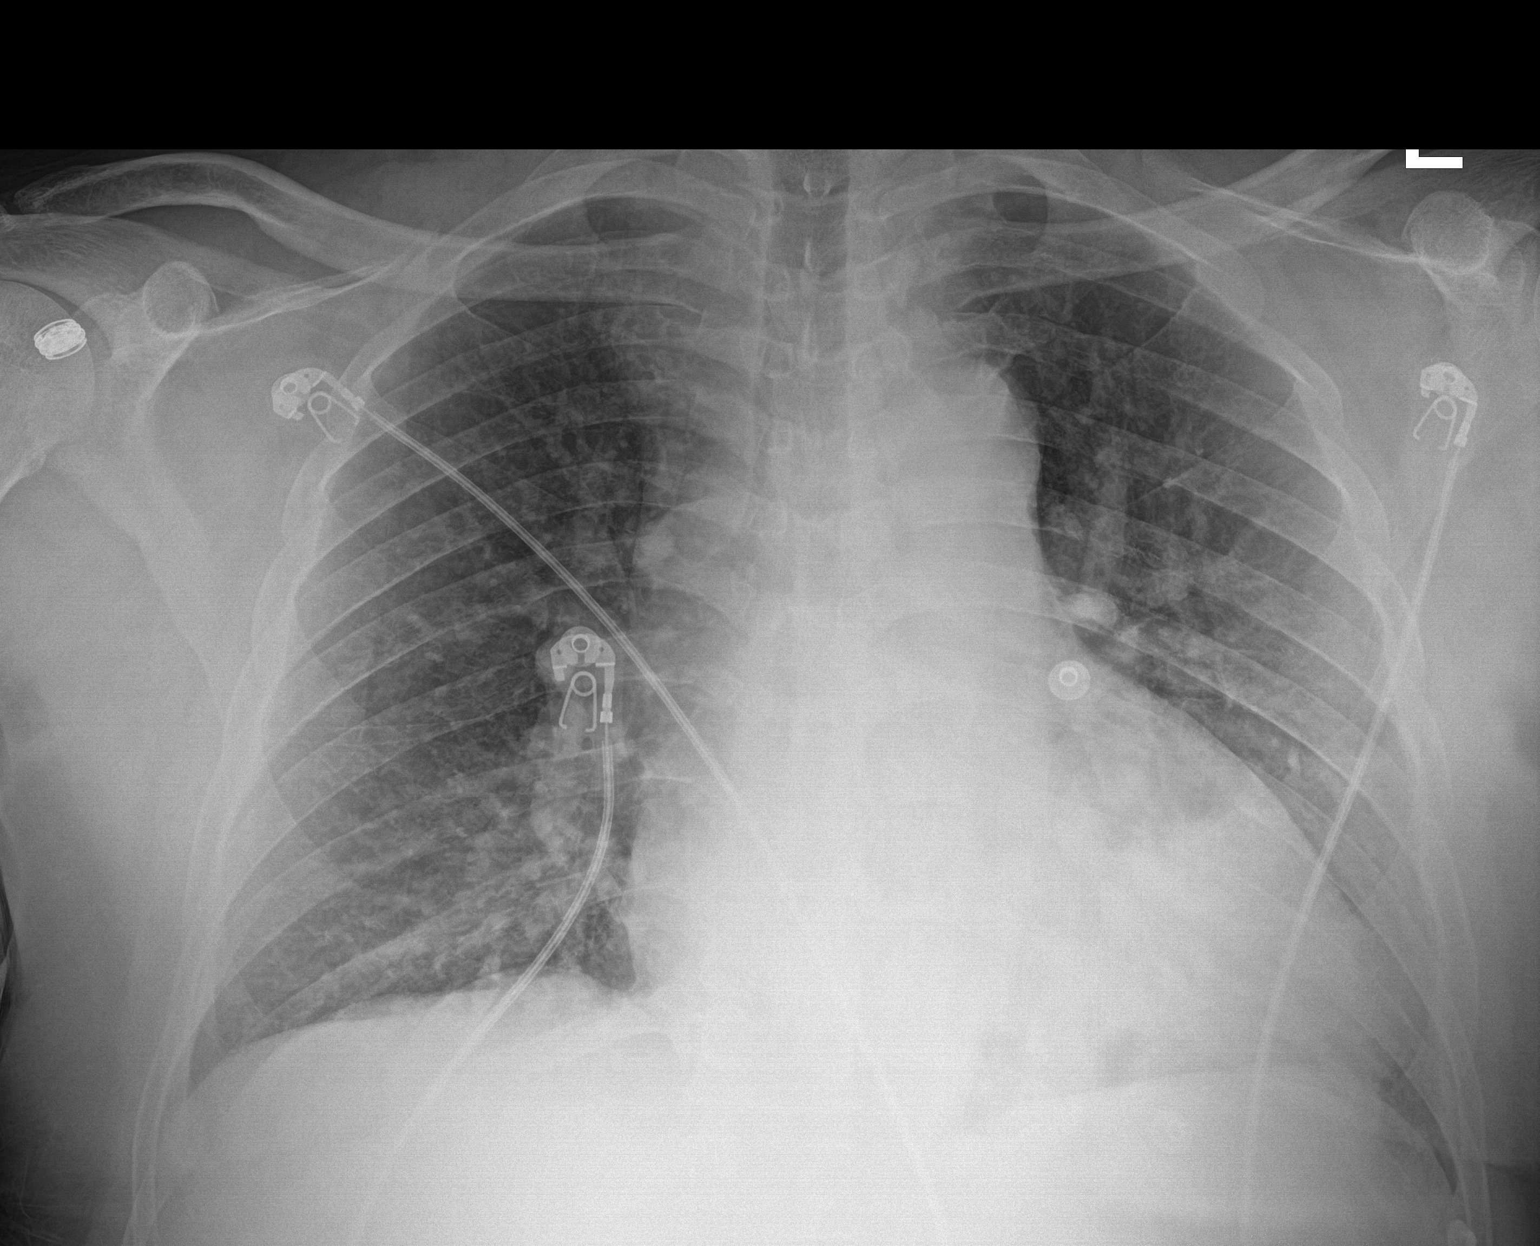

[1 of 1 positions shown; findings below may reference images not displayed]

FINDINGS: There is mild cardiomegaly with vascular congestion. No focal
consolidation, pleural effusion pneumothorax. No acute osseous
pathology.
IMPRESSION: Mild cardiomegaly with vascular congestion. No focal consolidation.

## 2021-06-17 MED ORDER — SODIUM CHLORIDE 0.9 % IV SOLN
2.0000 g | Freq: Once | INTRAVENOUS | Status: AC
  Administered 2021-06-17: 2 g via INTRAVENOUS
  Filled 2021-06-17: qty 20

## 2021-06-17 MED ORDER — VANCOMYCIN VARIABLE DOSE PER UNSTABLE RENAL FUNCTION (PHARMACIST DOSING): Status: DC

## 2021-06-17 MED ORDER — SODIUM CHLORIDE 0.9 % IV BOLUS
500.0000 mL | Freq: Once | INTRAVENOUS | Status: AC
  Administered 2021-06-17: 500 mL via INTRAVENOUS

## 2021-06-17 MED ORDER — CEPHALEXIN 500 MG PO CAPS: 500.0000 mg | ORAL_CAPSULE | Freq: Two times a day (BID) | ORAL | 0 refills | Status: AC

## 2021-06-17 MED ORDER — HEPARIN 1000 UNIT/ML FOR PERITONEAL DIALYSIS: INTRAPERITONEAL | Status: DC | PRN

## 2021-06-17 MED ORDER — VANCOMYCIN HCL 2000 MG/400ML IV SOLN
2000.0000 mg | Freq: Once | INTRAVENOUS | Status: AC
  Administered 2021-06-17: 2000 mg via INTRAVENOUS
  Filled 2021-06-17: qty 400

## 2021-06-17 MED ORDER — ACETAMINOPHEN 500 MG PO TABS
1000.0000 mg | ORAL_TABLET | Freq: Once | ORAL | Status: AC
  Administered 2021-06-17: 1000 mg via ORAL
  Filled 2021-06-17: qty 2

## 2021-06-17 MED ORDER — IOHEXOL 300 MG/ML  SOLN
100.0000 mL | Freq: Once | INTRAMUSCULAR | Status: AC | PRN
  Administered 2021-06-17: 100 mL via INTRAVENOUS

## 2021-06-17 MED ORDER — ONDANSETRON HCL 4 MG/2ML IJ SOLN
4.0000 mg | Freq: Once | INTRAMUSCULAR | Status: AC
  Administered 2021-06-17: 4 mg via INTRAVENOUS
  Filled 2021-06-17: qty 2

## 2021-06-17 MED ORDER — SODIUM CHLORIDE 0.9 % IV SOLN
1.0000 g | INTRAVENOUS | Status: DC
  Administered 2021-06-17: 1 g via INTRAVENOUS
  Filled 2021-06-17: qty 1

## 2021-06-17 MED ORDER — DELFLEX-LC/1.5% DEXTROSE 344 MOSM/L IP SOLN: INTRAPERITONEAL | Status: DC

## 2021-06-17 NOTE — ED Triage Notes (Signed)
Patient states that he was here earlier this am amd treated for bladder infection. Was sent back to ED by kidney doctor for cultures. Unsure if the exchange fluid from PD was cloudy. Fever x 1 day. Alert and oriented. Complains of feeling bloated, had exchange prior to arrival

## 2021-06-17 NOTE — Progress Notes (Signed)
Pharmacy Antibiotic Note  Albert Eaton is a 55 y.o. male with a history of ESRD on HD MWF - transitioning to PD but has not started. PD cath placed recently, this was evaluated at patient's HD session today. Patient states that they placed some fluid into his PD cath today, and they informed him to have this drained and sent for culture given his continued fever.  Pharmacy has been consulted for vancomycin and ceftazidime dosing for pd peritonitis.  Plan: Ceftazidime 1g daily Vancomycin 2086m once - subsequent dosing to be determined by dialysis status (HD vs PD) Follow cultures Follow up nephrology recommendations De-escalate antibiotics as appropriate     Temp (24hrs), Avg:100 F (37.8 C), Min:98.4 F (36.9 C), Max:101.9 F (38.8 C)  Recent Labs  Lab 06/17/21 0105  WBC 8.4  CREATININE 14.50*  LATICACIDVEN 1.0    Estimated Creatinine Clearance: 6.7 mL/min (A) (by C-G formula based on SCr of 14.5 mg/dL (H)).    Allergies  Allergen Reactions   Ace Inhibitors Cough and Other (See Comments)   Lisinopril Cough    Antimicrobials this admission: vancomycin 8/22 >>  Ceftazidime 8/22 >>   Microbiology results: Pending  Thank you for allowing pharmacy to be a part of this patient's care.  JLorelei Pont PharmD, BCPS 06/17/2021 9:09 PM ED Clinical Pharmacist -  [phone]

## 2021-06-17 NOTE — ED Notes (Signed)
Received verbal report from [place] at this time

## 2021-06-17 NOTE — ED Provider Notes (Signed)
Emergency Medicine Provider Triage Evaluation Note  Albert Eaton , a 55 y.o. male  was evaluated in triage.  Pt complains of being sent back to the emergency room by his nephrologist. He states that he was instructed to return to the ER by his nephrologist.  He states that they put some amount of fluid into his peritoneal dialysis, and that he needs to have this drained and sent for culture.  He was not sent with any written instructions.  He states that his nephrologist talked with the charge nurse prior to his arrival..  Review of Systems  Positive: Fevers, feeling poorly Negative: syncope  Physical Exam  BP (!) 151/94 (BP Location: Right Arm)   Pulse 97   Temp (!) 100.9 F (38.3 C) (Oral)   Resp 17   SpO2 97%  Gen:   Awake, no distress   Resp:  Normal effort  MSK:   Moves extremities without difficulty  Other:  Alert, no distress.   Medical Decision Making  Medically screening exam initiated at 2:07 PM.  Appropriate orders placed.  Albert Eaton was informed that the remainder of the evaluation will be completed by another provider, this initial triage assessment does not replace that evaluation, and the importance of remaining in the ED until their evaluation is complete.     Lorin Glass, [place] 06/17/21 1410    Tegeler, Gwenyth Allegra, MD 06/17/21 [phone]

## 2021-06-17 NOTE — ED Provider Notes (Signed)
[facility] EMERGENCY DEPARTMENT Provider Note   CSN: [id number] Arrival date & time: 06/17/21  1330     History No chief complaint on file.   Albert Eaton is a 55 y.o. male.  HPI Patient presents for the second time in 24 hours, now after completing a full dialysis session, upon request of his nephrology team due to ongoing chills and headache.  He notes that he has been doing generally well though he has multiple medical issues. Initially arrived to the ED overnight due to headache, chills.  Initial evaluation generally reassuring, was discharged, had a full dialysis session, and was subsequently seen by his peritoneal dialysis team.  He reports that he had infusion, but the team there was unable to obtain fluid for culture, and he was sent here for additional evaluation.  He denies any changes beyond those that brought him here earlier in the day, headache, chills, has no new weakness, vomiting, focal pain.  Does have some mild bloating in his abdomen that began after he had fluid infusion.    Past Medical History:  Diagnosis Date   Anemia    Aortic atherosclerosis (HCC)    Arthritis    Asthma    Cancer ([place])    renal cyst   Chronic diastolic CHF (congestive heart failure) ([place]) 01/06/2017   Echo 7/16 [facility] in [place], [name]) Mild AI, mild LAE, mild concentric LVH, EF 55, normal wall motion, mild to moderate MR, mild PI, RVSP 55 // Echo 10/09/16 ([facility]):  Moderate LVH, grade 2 diastolic dysfunction, mild MR, moderate LAE    CKD (chronic kidney disease)    Dialysis Mon Wed Fri   Esophagitis    GERD (gastroesophageal reflux disease)    Gout    no current problems   Heart murmur    never caused any problems   Hepatitis    Hep B   History of nuclear stress test    a. Nuc study 7/16: no scar or ischemia, EF 42 // b. Nuc study 12/17: EF 48, ?small apical ischemia, Low Risk   HIV (human immunodeficiency virus infection) ([place])    HLD (hyperlipidemia)     Hypertension    Hypertensive heart disease with CHF (congestive heart failure) ([place]) 10/09/2016   Hypothyroidism    Mitral regurgitation    OSA (obstructive sleep apnea)    Post-operative nausea and vomiting    1 time as a child, no problems as an adult   Sleep apnea    uses c-pap   Thyroid disease    Wears glasses    Wears partial dentures     Patient Active Problem List   Diagnosis Date Noted   SVT (supraventricular tachycardia) ([place]) 01/18/2021   Lower abdominal pain 06/26/2020   Pulsatile tinnitus of right ear 11/21/2019   Kidney failure 17/00/1749   Acute diastolic CHF (congestive heart failure) ([place]) 11/21/2019   Chills (without fever) 11/07/2019   Hypercalcemia 10/17/2019   Dependence on renal dialysis ([place]) 06/06/2019   Coagulation defect, unspecified ([place]) 05/23/2019   Iron deficiency anemia, unspecified 05/16/2019   Anemia in other chronic diseases classified elsewhere 05/13/2019   Arteriovenous fistula, acquired ([place]) 05/13/2019   Body mass index (BMI) 28.0-28.9, adult 05/13/2019   Crohn's disease of small intestine without complications ([place]) 44/96/7591   Encounter for screening for respiratory tuberculosis 05/13/2019   Gout, unspecified 05/13/2019   Nausea 05/13/2019   Other fatigue 05/13/2019   Secondary hyperparathyroidism of renal origin ([place]) 05/13/2019   End  stage renal disease ([place]) 05/13/2019   Chronic diastolic (congestive) heart failure ([place]) 05/13/2019   Chronic kidney disease, unspecified 05/13/2019   Obstructive sleep apnea 05/13/2019   Discomfort of right ear 02/18/2019   Hypokalemia 06/13/2018   Abnormal CT scan, small bowel    Perennial allergic rhinitis with a predominant nonallergic component 02/09/2018   Allergic conjunctivitis 02/09/2018   Mild intermittent asthma 02/09/2018   Atherosclerosis 12/09/2017   Hypothyroidism 11/19/2017   Mixed hyperlipidemia 10/13/2017   Personal history of gout 10/13/2017   S/P cholecystectomy 10/13/2017    Congestive heart failure with LV diastolic dysfunction, NYHA class 1 ([place]) 10/13/2017   Hypothyroidism (acquired) 10/13/2017   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) 05/08/2015   Severe mitral regurgitation 05/04/2015   Prolonged Q-T interval on ECG 05/04/2015    Past Surgical History:  Procedure Laterality Date   AV FISTULA PLACEMENT Left 07/02/2018   Procedure: Creation of Left arm BRACHIOBASILIC ARTERIOVENOUS  FISTULA;  Surgeon: Marty Heck, MD;  Location: [facility];  Service: Vascular;  Laterality: Left;   [place] Left 08/27/2018   Procedure: Left arm BRACHIOBASILIC VEIN TRANSPOSITION SECOND STAGE;  Surgeon: Marty Heck, MD;  Location: [facility] OR;  Service: Vascular;  Laterality: Left;   BUBBLE STUDY  09/03/2020   Procedure: BUBBLE STUDY;  Surgeon: Fay Records, MD;  Location: [facility];  Service: Cardiovascular;;   CAPD INSERTION N/A 05/30/2021   Procedure: LAPAROSCOPIC INSERTION CONTINUOUS AMBULATORY PERITONEAL DIALYSIS  (CAPD) CATHETER;  Surgeon: [name], MD;  Location: [facility] OR;  Service: Vascular;  Laterality: N/A;   CHOLECYSTECTOMY     COLONOSCOPY W/ BIOPSIES AND POLYPECTOMY     GIVENS CAPSULE STUDY N/A 03/01/2018   Procedure: GIVENS CAPSULE STUDY;  Surgeon: Jerene Bears, MD;  Location: [facility];  Service: Gastroenterology;  Laterality: N/A;   HERNIA REPAIR     As baby   MULTIPLE TOOTH  EXTRACTIONS     NEPHRECTOMY Left 02/18/2021   NOSE SURGERY     RENAL BIOPSY     TEE WITHOUT CARDIOVERSION N/A 09/03/2020   Procedure: TRANSESOPHAGEAL ECHOCARDIOGRAM (TEE);  Surgeon: Fay Records, MD;  [facility];  Service: Cardiovascular;  Laterality: N/A;   UPPER GI ENDOSCOPY  04/05/2021       Family History  Problem Relation Age of Onset   Heart failure Mother    Heart attack Maternal Grandmother 59   Hypertension Sister    Multiple sclerosis Sister    Hypertension Brother    Hypertension Sister    Scoliosis Other    Allergic rhinitis Neg Hx    Angioedema Neg Hx    Asthma Neg Hx    Eczema Neg Hx    Immunodeficiency Neg Hx    Urticaria Neg Hx    Colon cancer Neg Hx    Pancreatic cancer Neg Hx    Esophageal cancer Neg Hx     Social History   Tobacco Use   Smoking status: Former  Packs/day: 1.00    Types: Cigarettes    Quit date: 2000    Years since quitting: 22.6   Smokeless tobacco: Never  Vaping Use   Vaping Use: Never used  Substance Use Topics   Alcohol use: Yes    Comment: occasionally liquor   Drug use: No    Home Medications Prior to Admission medications   Medication Sig Start Date End Date Taking? Authorizing Provider  ACETAMINOPHEN EXTRA STRENGTH 500 MG tablet Take 500 mg by mouth every 6 (six) hours as needed (pain.). 02/20/21  Yes [provider]  albuterol (VENTOLIN HFA) 108 (90 Base) MCG/ACT inhaler INHALE 2 PUFFS INTO THE LUNGS EVERY 4 HOURS AS NEEDED FOR WHEEZING OR SHORTNESS OF BREATH Patient taking differently: Inhale 2 puffs into the lungs every 4 (four) hours as needed for wheezing or shortness of breath. 05/27/21  Yes Valentina Shaggy, MD  allopurinol (ZYLOPRIM) 100 MG tablet TAKE 1 TABLET(100 MG) BY MOUTH TWICE DAILY FOR 60 DOSES Patient taking differently: Take 100 mg by mouth 2 (two) times daily. 06/13/21  Yes [name], FNP  amLODipine (NORVASC) 10 MG tablet Take 1 tablet (10 mg total) by mouth  daily. Patient taking differently: Take 10 mg by mouth daily at 12 noon. At midday 10/31/16  Yes [name], FNP  aspirin EC 81 MG tablet Take 1 tablet (81 mg total) by mouth daily. Swallow whole. Patient taking differently: Take 81 mg by mouth in the morning. Swallow whole. 03/06/21  Yes Weaver, Scott T, PA-C  azelastine (ASTELIN) 0.1 % nasal spray PLACE 2 SPRAYS INTO EACH NOSTRIL TWICE DAILY AS NEEDED FOR RHINITIS AS DIRECTED Patient taking differently: Place 2 sprays into both nostrils 2 (two) times daily as needed for allergies or rhinitis. 06/05/21  Yes Valentina Shaggy, MD  BIKTARVY 50-200-25 MG TABS tablet TAKE 1 TABLET BY MOUTH DAILY Patient taking differently: Take 1 tablet by mouth daily. 06/10/21  Yes Campbell Riches, MD  calcitRIOL (ROCALTROL) 0.5 MCG capsule Take 0.5 mcg by mouth every Monday, Wednesday, and Friday. 02/06/21  Yes [provider]  carvedilol (COREG) 25 MG tablet TAKE 1 TABLET BY MOUTH TWICE DAILY WITH FOOD Patient taking differently: Take 25 mg by mouth 2 (two) times daily with a meal. 12/31/20  Yes [name], MD  chlorproMAZINE (THORAZINE) 10 MG tablet Take 1 tablet (10 mg total) by mouth 3 (three) times daily as needed for hiccoughs. Patient taking differently: Take 10 mg by mouth 3 (three) times daily. 02/26/21  Yes [name], FNP  clonazePAM (KLONOPIN) 0.5 MG tablet Take 1 tablet (0.5 mg total) by mouth at bedtime. 01/02/21  Yes Olalere, Adewale A, MD  clotrimazole-betamethasone (LOTRISONE) cream Apply topically 2 (two) times daily. X 3-5 days. IF symptoms continue, call office Patient taking differently: Apply 1 application topically 2 (two) times daily as needed (rectal discomfort). 05/16/21  Yes Pyrtle, Lajuan Lines, MD  docusate sodium (COLACE) 100 MG capsule Take 100 mg by mouth 2 (two) times daily. 02/20/21  Yes [provider]  FLOVENT HFA 110 MCG/ACT inhaler INHALE 2 PUFFS INTO THE LUNGS TWICE DAILY Patient taking differently:  Inhale 2 puffs into the lungs 2 (two) times daily as needed (asthma/respiratiory issues.). 05/27/21  Yes Valentina Shaggy, MD  gabapentin (NEURONTIN) 300 MG capsule TAKE 1 CAPSULE(300 MG) BY MOUTH THREE TIMES DAILY Patient taking differently: Take 300 mg by mouth 3 (three) times daily. 10/09/20  Yes [name], FNP  hydrALAZINE (APRESOLINE) 100 MG  tablet Take 1 tablet (100 mg total) by mouth 3 (three) times daily. 04/02/21 04/02/22 Yes [name], MD  hyoscyamine (LEVSIN SL) 0.125 MG SL tablet Place 1-2 tablets (0.125-0.25 mg total) under the tongue every 8 (eight) hours as needed (lower abdominal pain, spasm). 08/05/19  Yes Pyrtle, Lajuan Lines, MD  ipratropium (ATROVENT) 0.06 % nasal spray USE 2 SPRAYS IN EACH NOSTRIL 2 TO 3 TIMES DAILY AS NEEDED Patient taking differently: Place 2 sprays into both nostrils 3 (three) times daily as needed (allergies). 09/03/20  Yes Valentina Shaggy, MD  iron sucrose (VENOFER) 20 MG/ML injection Inject into the vein. 07/12/20  Yes [provider]  lactulose (CHRONULAC) 10 GM/15ML solution Take 20 g by mouth daily as needed for moderate constipation.  07/08/20  Yes [provider]  levothyroxine (SYNTHROID) 25 MCG tablet TAKE 1 TABLET BY MOUTH DAILY BEFORE BREAKFAST Patient taking differently: Take 25 mcg by mouth daily before breakfast. 02/05/21  Yes [name], FNP  lidocaine-prilocaine (EMLA) cream Apply 1 application topically See admin instructions. Apply to access site 1-2 hours before dialysis 06/01/19  Yes [provider]  lubiprostone (AMITIZA) 24 MCG capsule TAKE 1 CAPSULE(24 MCG) BY MOUTH TWICE DAILY WITH A MEAL Patient taking differently: Take 24 mcg by mouth in the morning and at bedtime. 05/21/21  Yes Pyrtle, Lajuan Lines, MD  methocarbamol (ROBAXIN) 500 MG tablet Take 1 tablet (500 mg total) by mouth every 8 (eight) hours as needed for muscle spasms. Patient taking differently: Take 500 mg by mouth at bedtime. 02/11/21  Yes  Maudie Flakes, MD  Methoxy PEG-Epoetin Beta (MIRCERA IJ) Inject into the skin. 11/08/20  Yes [provider]  multivitamin (RENA-VIT) TABS tablet Take 1 tablet by mouth at bedtime. 01/10/21  Yes [provider]  mupirocin ointment (BACTROBAN) 2 % Apply 1 application topically daily as needed (apply to port after dialysis).  04/24/20  Yes [provider]  Olopatadine HCl (PAZEO) 0.7 % SOLN Place 1 drop into both eyes daily. Patient taking differently: Place 1 drop into both eyes daily as needed (itchy eyes). 05/24/20  Yes Valentina Shaggy, MD  oxyCODONE-acetaminophen (PERCOCET) 10-325 MG tablet Take 1 tablet by mouth every 6 (six) hours as needed for pain. 05/30/21 05/30/22 Yes Setzer, Edman Circle, PA-C  pantoprazole (PROTONIX) 40 MG tablet Take 1 tablet (40 mg total) by mouth 2 (two) times daily before a meal. 04/04/21 05/30/23 Yes Esterwood, Amy S, PA-C  rOPINIRole (REQUIP) 0.5 MG tablet Take 0.5 mg by mouth at bedtime. 02/05/21  Yes [provider]  rosuvastatin (CRESTOR) 10 MG tablet Take 1 tablet (10 mg total) by mouth daily. Patient taking differently: Take 10 mg by mouth in the morning. 03/06/21 06/17/21 Yes Weaver, Scott T, PA-C  senna (SENOKOT) 8.6 MG TABS tablet Take 2 tablets by mouth every evening. 02/20/21  Yes [provider]  sevelamer carbonate (RENVELA) 800 MG tablet Take 1,600 mg by mouth 3 (three) times daily with meals. 11/09/20  Yes [provider]  Vitamin D, Ergocalciferol, (DRISDOL) 1.25 MG (50000 UNIT) CAPS capsule TAKE 1 CAPSULE BY MOUTH EVERY 7 DAYS Patient taking differently: Take 50,000 Units by mouth every Tuesday. At midday 02/05/21  Yes [name], FNP  cephALEXin (KEFLEX) 500 MG capsule Take 1 capsule (500 mg total) by mouth 2 (two) times daily for 7 days. 06/17/21 06/24/21  Maudie Flakes, MD  gentamicin cream (GARAMYCIN) 0.1 % Apply 1 application topically See admin instructions. Apply when catheter is  flushed and cleaned  05/28/21   [provider]  Spacer/Aero-Holding Chambers DEVI 1 Device by Does not apply route 2 (two) times a day. 03/01/19   Bobbitt, Sedalia Muta, MD    Allergies    Ace inhibitors and Lisinopril  Review of Systems   Review of Systems  Constitutional:        Per HPI, otherwise negative  HENT:         Per HPI, otherwise negative  Respiratory:         Per HPI, otherwise negative  Cardiovascular:        Per HPI, otherwise negative  Gastrointestinal:  Negative for vomiting.  Endocrine:       Negative aside from HPI  Genitourinary:        Neg aside from HPI   Musculoskeletal:        Per HPI, otherwise negative  Skin: Negative.   Allergic/Immunologic: Positive for immunocompromised state.  Neurological:  Negative for syncope.   Physical Exam Updated Vital Signs BP (!) 146/85 (BP Location: Right Arm)   Pulse 88   Temp (!) 100.9 F (38.3 C) (Oral)   Resp 15   SpO2 94%   Physical Exam Vitals and nursing note reviewed.  Constitutional:      General: He is not in acute distress.    Appearance: He is well-developed.  HENT:     Head: Normocephalic and atraumatic.  Eyes:     Conjunctiva/sclera: Conjunctivae normal.  Cardiovascular:     Rate and Rhythm: Normal rate and regular rhythm.  Pulmonary:     Effort: Pulmonary effort is normal. No respiratory distress.     Breath sounds: No stridor.  Abdominal:     General: There is no distension.     Tenderness: There is no abdominal tenderness. There is no guarding.  Musculoskeletal:     Comments: Left upper extremity dialysis graft grossly unremarkable with palpable thrill distal.  Skin:    General: Skin is warm and dry.  Neurological:     Mental Status: He is alert and oriented to person, place, and time.    ED Results / Procedures / Treatments   Labs (all labs ordered are listed, but only abnormal results are displayed) Labs Reviewed  BODY FLUID CULTURE W GRAM STAIN    EKG None  Radiology CT ABDOMEN  PELVIS W CONTRAST  Result Date: 06/17/2021 CLINICAL DATA:  Right lower quadrant abdominal pain, fever, dyspnea EXAM: CT ABDOMEN AND PELVIS WITH CONTRAST TECHNIQUE: Multidetector CT imaging of the abdomen and pelvis was performed using the standard protocol following bolus administration of intravenous contrast. CONTRAST:  167m OMNIPAQUE IOHEXOL 300 MG/ML  SOLN COMPARISON:  02/11/2021 FINDINGS: Lower chest: No acute abnormality. Hepatobiliary: No focal liver abnormality is seen. Status post cholecystectomy. No biliary dilatation. Pancreas: Unremarkable Spleen: Unremarkable Adrenals/Urinary Tract: The adrenal glands are unremarkable. Status post left nephrectomy. The right kidney is normal in position and is mildly atrophic. Simple endophytic cortical cyst noted within the upper pole. No hydronephrosis. No intrarenal or ureteral calculi. The bladder is decompressed. Moderate circumferential perivesicular inflammatory stranding is present suggesting a diffuse infectious or inflammatory cystitis. Stomach/Bowel: Stomach is within normal limits. Appendix appears normal. No evidence of bowel wall thickening, distention, or inflammatory changes. No free intraperitoneal gas or fluid. Peritoneal dialysis catheter seen looped within the deep pelvis. Vascular/Lymphatic: Mild aortoiliac atherosclerotic calcification. No aortic aneurysm. No pathologic adenopathy within the abdomen and pelvis. Reproductive: Prostate is unremarkable. Other: The small bilateral fat containing inguinal hernias  are present. Rectum unremarkable. Musculoskeletal: No acute bone abnormality. No lytic or blastic bone lesion. IMPRESSION: Perivesicular inflammatory change suggesting a diffuse infectious or inflammatory cystitis. Correlation with urinalysis and urine culture may be helpful. Normal appendix. Status post left nephrectomy. Peritoneal dialysis catheter in expected position within the deep pelvis. Aortic Atherosclerosis (ICD10-I70.0).  Electronically Signed   By: Fidela Salisbury M.D.   On: 06/17/2021 03:35   DG Chest Port 1 View  Result Date: 06/17/2021 CLINICAL DATA:  Fever and shortness of breath EXAM: PORTABLE CHEST 1 VIEW COMPARISON:  Chest radiograph dated 04/01/2021. FINDINGS: There is mild cardiomegaly with vascular congestion. No focal consolidation, pleural effusion pneumothorax. No acute osseous pathology. IMPRESSION: Mild cardiomegaly with vascular congestion. No focal consolidation. Electronically Signed   By: Anner Crete M.D.   On: 06/17/2021 01:43    Procedures Procedures   Medications Ordered in ED Medications - No data to display  ED Course  I have reviewed the triage vital signs and the nursing notes.  Pertinent labs & imaging results that were available during my care of the patient were reviewed by me and considered in my medical decision making (see chart for details).    After initial evaluation reviewed the patient's chart including CT, x-ray studies from earlier in the day, as well as labs.  Findings generally reassuring.  I subsequently discussed this case with our nephrology team to facilitate peritoneal fluid sample collection  10:57 PM Patient in no distress, no ongoing complaints, is awake, alert, on a telephone call. We discussed findings thus far, he has been seen and evaluated by our nephrology colleagues, and recommendation for discharge with close outpatient follow-up accommodated after empiric initiation of vancomycin with ceftaz.  No hypotension, no distress, no ongoing complaints, low suspicion for bacteremia, sepsis, but patient will continue close follow-up with nephrology, per their recommendation.  Final Clinical Impression(s) / ED Diagnoses Final diagnoses:  Peritonitis [facility])     Carmin Muskrat, MD 06/17/21 2258

## 2021-06-17 NOTE — Progress Notes (Signed)
[facility] [place] PROGRESS NOTE  ASSESSMENT / PLAN: Albert Eaton is a 55 y.o. male status post laparoscopic peritoneal dialysis catheter placement 05/30/21. He had a fever. He had a culture drawn of peritoneal fluid. I see no external signs of infection. Catheter seems to be working well.  If the dialysis center decides the catheter needs to be removed, we can do this in the operating room in elective fashion.  Follow up with me as needed for catheter issues.   SUBJECTIVE: Reports he had a fever recently.  Dialysis center cultured peritoneal fluid.  OBJECTIVE: BP 136/88 (BP Location: Right Arm, Patient Position: Sitting, Cuff Size: Large)   Pulse 84   Temp 99 F (37.2 C)   Resp 20   Ht 5' 10"  (1.778 m)   Wt 210 lb (95.3 kg)   SpO2 97%   BMI 30.13 kg/m   Constitutional: well appearing. no acute distress. Abdomen: laparoscopic incisions healing well.  No signs of infection at the catheter exit site.  Catheter covered appropriately.  CBC Latest Ref Rng & Units 06/17/2021 05/30/2021 04/01/2021  WBC 4.0 - 10.5 K/uL 8.4 - 6.6  Hemoglobin 13.0 - 17.0 g/dL 10.0(L) 12.6(L) 11.9(L)  Hematocrit 39.0 - 52.0 % 29.2(L) 37.0(L) 33.5(L)  Platelets 150 - 400 K/uL 203 - 164     CMP Latest Ref Rng & Units 06/17/2021 05/30/2021 05/01/2021  Glucose 70 - 99 mg/dL 98 115(H) -  BUN 6 - 20 mg/dL 64(H) 31(H) -  Creatinine 0.61 - 1.24 mg/dL 14.50(H) 9.40(H) -  Sodium 135 - 145 mmol/L 137 132(L) -  Potassium 3.5 - 5.1 mmol/L 3.4(L) 3.5 -  Chloride 98 - 111 mmol/L 102 94(L) -  CO2 22 - 32 mmol/L 20(L) - -  Calcium 8.9 - 10.3 mg/dL 9.8 - -  Total Protein 6.5 - 8.1 g/dL 7.3 - 8.2  Total Bilirubin 0.3 - 1.2 mg/dL 0.7 - 0.4  Alkaline Phos 38 - 126 U/L 72 - 96  AST 15 - 41 U/L 14(L) - 11  ALT 0 - 44 U/L 9 - 13    Estimated Creatinine Clearance: 6.7 mL/min (A) (by C-G formula based on SCr of 14.5 mg/dL (H)).  Albert Eaton. Albert Breed, MD [facility]  Phone Number: [phone] 06/17/2021 2:54 PM

## 2021-06-17 NOTE — ED Provider Notes (Signed)
[facility] Emergency Department Provider Note MRN:  [id number]  Arrival date & time: 06/17/21     Chief Complaint   Fever and Dizziness   History of Present Illness   Albert Eaton is a 55 y.o. year-old male with a history of HIV, CHF, CKD presenting to the ED with chief complaint of fever.  Patient is endorsing fever, malaise, yesterday was having right lower quadrant abdominal pain and mucus diarrhea.  Also with nausea and vomiting today.  Denies headache, no vision change, no chest pain or shortness of breath, no other complaints.  Symptoms are mild to moderate, constant, no exacerbating or alleviating factors.  Had a peritoneal dialysis catheter placed 2 or 3 weeks ago.  Review of Systems  A complete 10 system review of systems was obtained and all systems are negative except as noted in the HPI and PMH.   Patient's Health History    Past Medical History:  Diagnosis Date   Anemia    Aortic atherosclerosis (HCC)    Arthritis    Asthma    Cancer ([place])    renal cyst   Chronic diastolic CHF (congestive heart failure) ([place]) 01/06/2017   Echo 7/16 [facility] in [place], [name]) Mild AI, mild LAE, mild concentric LVH, EF 55, normal wall motion, mild to moderate MR, mild PI, RVSP 55 // Echo 10/09/16 ([facility]):  Moderate LVH, grade 2 diastolic dysfunction, mild MR, moderate LAE    CKD (chronic kidney disease)    Dialysis Mon Wed Fri   Esophagitis    GERD (gastroesophageal reflux disease)    Gout    no current problems   Heart murmur    never caused any problems   Hepatitis    Hep B   History of nuclear stress test    a. Nuc study 7/16: no scar or ischemia, EF 42 // b. Nuc study 12/17: EF 48, ?small apical ischemia, Low Risk   HIV (human immunodeficiency virus infection) ([place])    HLD (hyperlipidemia)    Hypertension    Hypertensive heart disease with CHF (congestive heart failure) ([place]) 10/09/2016   Hypothyroidism    Mitral regurgitation    OSA  (obstructive sleep apnea)    Post-operative nausea and vomiting    1 time as a child, no problems as an adult   Sleep apnea    uses c-pap   Thyroid disease    Wears glasses    Wears partial dentures     Past Surgical History:  Procedure Laterality Date   AV FISTULA PLACEMENT Left 07/02/2018   Procedure: Creation of Left arm BRACHIOBASILIC ARTERIOVENOUS  FISTULA;  Surgeon: Marty Heck, MD;  Location: [place];  Service: Vascular;  Laterality: Left;   [place] Left 08/27/2018   Procedure: Left arm BRACHIOBASILIC VEIN TRANSPOSITION SECOND STAGE;  Surgeon: Marty Heck, MD;  Location: [place];  Service: Vascular;  Laterality: Left;   BUBBLE STUDY  09/03/2020   Procedure: BUBBLE STUDY;  Surgeon: Fay Records, MD;  Location: [place];  Service: Cardiovascular;;   CAPD INSERTION N/A 05/30/2021   Procedure: LAPAROSCOPIC INSERTION CONTINUOUS AMBULATORY PERITONEAL DIALYSIS  (CAPD) CATHETER;  Surgeon: [name], MD;  Location: [facility] OR;  Service: Vascular;  Laterality: N/A;   CHOLECYSTECTOMY     COLONOSCOPY W/ BIOPSIES AND POLYPECTOMY     GIVENS CAPSULE STUDY N/A 03/01/2018   Procedure: GIVENS CAPSULE STUDY;  Surgeon: Jerene Bears, MD;  Location: [place];  Service: Gastroenterology;  Laterality:  N/A;   HERNIA REPAIR     As baby   MULTIPLE TOOTH EXTRACTIONS     NEPHRECTOMY Left 02/18/2021   NOSE SURGERY     RENAL BIOPSY     TEE WITHOUT CARDIOVERSION N/A 09/03/2020   Procedure: TRANSESOPHAGEAL ECHOCARDIOGRAM (TEE);  Surgeon: Fay Records, MD;  Location: [facility] ENDOSCOPY;  Service: Cardiovascular;  Laterality: N/A;   UPPER GI ENDOSCOPY  04/05/2021    Family History  Problem Relation Age of Onset   Heart failure Mother    Heart attack Maternal Grandmother 20   Hypertension Sister    Multiple sclerosis Sister    Hypertension Brother    Hypertension Sister    Scoliosis Other    Allergic rhinitis Neg Hx    Angioedema Neg Hx    Asthma Neg Hx     Eczema Neg Hx    Immunodeficiency Neg Hx    Urticaria Neg Hx    Colon cancer Neg Hx    Pancreatic cancer Neg Hx    Esophageal cancer Neg Hx     Social History   Socioeconomic History   Marital status: Single    Spouse name: Not on file   Number of children: Not on file   Years of education: Not on file   Highest education level: Not on file  Occupational History   Not on file  Tobacco Use   Smoking status: Former    Packs/day: 1.00    Types: Cigarettes    Quit date: 2000    Years since quitting: 22.6   Smokeless tobacco: Never  Vaping Use   Vaping Use: Never used  Substance and Sexual Activity   Alcohol use: Yes    Comment: occasionally liquor   Drug use: No   Sexual activity: Not Currently    Partners: Male    Birth control/protection: Condom    Comment: declined  condoms 07/11/20  Other Topics Concern   Not on file  Social History Narrative   Not on file   Social Determinants of Health   Financial Resource Strain: Not on file  Food Insecurity: No Food Insecurity   Worried About Charity fundraiser in the Last Year: Never true   [place] in the Last Year: Never true  Transportation Needs: No Transportation Needs   Lack of Transportation (Medical): No   Lack of Transportation (Non-Medical): No  Physical Activity: Not on file  Stress: Not on file  Social Connections: Not on file  Intimate Partner Violence: Not on file     Physical Exam   Vitals:   06/17/21 0400 06/17/21 0500  BP: 135/89 (!) 137/92  Pulse: 91 88  Resp: 18 18  Temp:    SpO2: 93% 93%    CONSTITUTIONAL: Well-appearing, NAD NEURO:  Alert and oriented x 3, no focal deficits EYES:  eyes equal and reactive ENT/NECK:  no LAD, no JVD CARDIO: Regular rate, well-perfused, normal S1 and S2 PULM:  CTAB no wheezing or rhonchi GI/GU:  normal bowel sounds, non-distended, non-tender MSK/SPINE:  No gross deformities, no edema SKIN:  no rash, atraumatic PSYCH:  Appropriate speech and  behavior  *Additional and/or pertinent findings included in MDM below  Diagnostic and Interventional Summary    EKG Interpretation  Date/Time:  Monday June 17 2021 00:44:16 EDT Ventricular Rate:  100 PR Interval:  178 QRS Duration: 86 QT Interval:  376 QTC Calculation: 485 R Axis:   65 Text Interpretation: Normal sinus rhythm Prolonged QT Abnormal ECG Confirmed by  Gerlene Fee [phone]) on 06/17/2021 1:31:42 AM       Labs Reviewed  COMPREHENSIVE METABOLIC PANEL - Abnormal; Notable for the following components:      Result Value   Potassium 3.4 (*)    CO2 20 (*)    BUN 64 (*)    Creatinine, Ser 14.50 (*)    AST 14 (*)    GFR, Estimated 4 (*)    All other components within normal limits  CBC WITH DIFFERENTIAL/PLATELET - Abnormal; Notable for the following components:   RBC 3.06 (*)    Hemoglobin 10.0 (*)    HCT 29.2 (*)    All other components within normal limits  URINALYSIS, ROUTINE W REFLEX MICROSCOPIC - Abnormal; Notable for the following components:   Hgb urine dipstick SMALL (*)    Protein, ur 100 (*)    Bacteria, UA RARE (*)    All other components within normal limits  RESP PANEL BY RT-PCR (FLU A&B, COVID) ARPGX2  CULTURE, BLOOD (ROUTINE X 2)  CULTURE, BLOOD (ROUTINE X 2)  URINE CULTURE  LACTIC ACID, PLASMA  PROTIME-INR  LACTIC ACID, PLASMA    CT ABDOMEN PELVIS W CONTRAST  Final Result    DG Chest Port 1 View  Final Result      Medications  sodium chloride 0.9 % bolus 500 mL (0 mLs Intravenous Stopped 06/17/21 0300)  acetaminophen (TYLENOL) tablet 1,000 mg (1,000 mg Oral Given 06/17/21 0204)  ondansetron (ZOFRAN) injection 4 mg (4 mg Intravenous Given 06/17/21 0202)  iohexol (OMNIPAQUE) 300 MG/ML solution 100 mL (100 mLs Intravenous Contrast Given 06/17/21 0301)  cefTRIAXone (ROCEPHIN) 2 g in sodium chloride 0.9 % 100 mL IVPB (0 g Intravenous Stopped 06/17/21 0529)     Procedures  /  Critical Care Procedures  ED Course and Medical Decision Making  I  have reviewed the triage vital signs, the nursing notes, and pertinent available records from the EMR.  Listed above are laboratory and imaging tests that I personally ordered, reviewed, and interpreted and then considered in my medical decision making (see below for details).  Differential diagnosis includes postoperative pneumonia or UTI, intra-abdominal infection, COVID-19, other viral illness.  History of HIV but compliant with his meds.  Nontoxic, febrile on arrival but otherwise with reassuring vital signs, awaiting work-up.     Work-up overall reassuring.  CT revealing signs of cystitis, no postoperative infectious concerns patient feeling much better, vital signs are normal, provided with dose of IV ceftriaxone here, continues to do well, appropriate for discharge on antibiotics.  Barth Kirks. Sedonia Small, [place] [email or web address]  Final Clinical Impressions(s) / ED Diagnoses     ICD-10-CM   1. Cystitis  N30.90     2. SOB (shortness of breath)  R06.02 DG Chest Port 1 View    DG Chest [place] 1 View      ED Discharge Orders          Ordered    cephALEXin (KEFLEX) 500 MG capsule  2 times daily        06/17/21 0545             Discharge Instructions Discussed with and Provided to Patient:     Discharge Instructions      You were evaluated in the Emergency Department and after careful evaluation, we did not find any emergent condition requiring admission or further testing in the hospital.  Your exam/testing today was overall reassuring.  Symptoms seem to be due to  a bladder infection.  Please take the antibiotics as directed and follow-up with your regular doctors.  Please return to the Emergency Department if you experience any worsening of your condition.  Thank you for allowing [place] to be a part of your care.         Maudie Flakes, MD 06/17/21 [phone]

## 2021-06-17 NOTE — Discharge Instructions (Addendum)
As discussed, labs have been sent for evaluation of your peritoneal dialysis situation.  If you develop new, or concerning changes, please return here otherwise follow-up tomorrow with your vascular surgeon and with your nephrologist.  They may indicate that you need to return to the hospital, depending on your results.

## 2021-06-17 NOTE — Progress Notes (Signed)
Brief nephrology note  Patient with recent PD cath placed evaluated by Dr. Hollie Salk on dialysis today. Has not transitioned to PD just yet and continues to receive HD. Has abdominal pain and fever. Culture and cell count have now been obtained. Will empirically order a dose of vanc and ceftaz given the concern for peritonitis.

## 2021-06-17 NOTE — ED Triage Notes (Addendum)
Pt states fever, sob and dizziness today.  Yesterday experienced body aches and RLQ pain and urinated mucus.  Pt is supposed to be able to use PD catheter at end of the month - last dialysed Wed.  No dialysis on Fri d/t diarrhea.

## 2021-06-17 NOTE — ED Notes (Signed)
Patient transported to CT 

## 2021-06-17 NOTE — Discharge Instructions (Addendum)
You were evaluated in the Emergency Department and after careful evaluation, we did not find any emergent condition requiring admission or further testing in the hospital.  Your exam/testing today was overall reassuring.  Symptoms seem to be due to a bladder infection.  Please take the antibiotics as directed and follow-up with your regular doctors.  Please return to the Emergency Department if you experience any worsening of your condition.  Thank you for allowing [place] to be a part of your care.

## 2021-06-18 ENCOUNTER — Encounter: Payer: Self-pay | Admitting: Vascular Surgery

## 2021-06-18 ENCOUNTER — Ambulatory Visit (INDEPENDENT_AMBULATORY_CARE_PROVIDER_SITE_OTHER): Payer: Medicare Other | Admitting: Vascular Surgery

## 2021-06-18 VITALS — BP 136/88 | HR 84 | Temp 99.0°F | Resp 20 | Ht 70.0 in | Wt 210.0 lb

## 2021-06-18 DIAGNOSIS — Z992 Dependence on renal dialysis: Secondary | ICD-10-CM

## 2021-06-18 LAB — GRAM STAIN

## 2021-06-18 LAB — URINE CULTURE: Culture: <10000 — AB

## 2021-06-18 LAB — PATHOLOGIST SMEAR REVIEW

## 2021-06-19 DIAGNOSIS — D509 Iron deficiency anemia, unspecified: Secondary | ICD-10-CM | POA: Diagnosis not present

## 2021-06-19 DIAGNOSIS — N2581 Secondary hyperparathyroidism of renal origin: Secondary | ICD-10-CM | POA: Diagnosis not present

## 2021-06-19 DIAGNOSIS — N186 End stage renal disease: Secondary | ICD-10-CM | POA: Diagnosis not present

## 2021-06-19 DIAGNOSIS — Z992 Dependence on renal dialysis: Secondary | ICD-10-CM | POA: Diagnosis not present

## 2021-06-19 DIAGNOSIS — D631 Anemia in chronic kidney disease: Secondary | ICD-10-CM | POA: Diagnosis not present

## 2021-06-20 ENCOUNTER — Encounter: Payer: Self-pay | Admitting: Infectious Diseases

## 2021-06-21 DIAGNOSIS — D509 Iron deficiency anemia, unspecified: Secondary | ICD-10-CM | POA: Diagnosis not present

## 2021-06-21 DIAGNOSIS — Z992 Dependence on renal dialysis: Secondary | ICD-10-CM | POA: Diagnosis not present

## 2021-06-21 DIAGNOSIS — N186 End stage renal disease: Secondary | ICD-10-CM | POA: Diagnosis not present

## 2021-06-21 DIAGNOSIS — D631 Anemia in chronic kidney disease: Secondary | ICD-10-CM | POA: Diagnosis not present

## 2021-06-21 DIAGNOSIS — N2581 Secondary hyperparathyroidism of renal origin: Secondary | ICD-10-CM | POA: Diagnosis not present

## 2021-06-22 ENCOUNTER — Other Ambulatory Visit: Payer: Self-pay | Admitting: Allergy & Immunology

## 2021-06-22 LAB — CULTURE, BODY FLUID W GRAM STAIN -BOTTLE: Culture: NO GROWTH

## 2021-06-22 LAB — CULTURE, BLOOD (ROUTINE X 2)
Culture: NO GROWTH
Culture: NO GROWTH
Special Requests: ADEQUATE
Special Requests: ADEQUATE

## 2021-06-24 DIAGNOSIS — K650 Generalized (acute) peritonitis: Secondary | ICD-10-CM | POA: Insufficient documentation

## 2021-06-24 DIAGNOSIS — Z79899 Other long term (current) drug therapy: Secondary | ICD-10-CM | POA: Diagnosis not present

## 2021-06-24 DIAGNOSIS — R82998 Other abnormal findings in urine: Secondary | ICD-10-CM | POA: Diagnosis not present

## 2021-06-24 DIAGNOSIS — Z992 Dependence on renal dialysis: Secondary | ICD-10-CM | POA: Diagnosis not present

## 2021-06-24 DIAGNOSIS — K7689 Other specified diseases of liver: Secondary | ICD-10-CM | POA: Diagnosis not present

## 2021-06-24 DIAGNOSIS — N186 End stage renal disease: Secondary | ICD-10-CM | POA: Diagnosis not present

## 2021-06-24 DIAGNOSIS — N2581 Secondary hyperparathyroidism of renal origin: Secondary | ICD-10-CM | POA: Diagnosis not present

## 2021-06-24 DIAGNOSIS — D509 Iron deficiency anemia, unspecified: Secondary | ICD-10-CM | POA: Diagnosis not present

## 2021-06-25 DIAGNOSIS — Z992 Dependence on renal dialysis: Secondary | ICD-10-CM | POA: Diagnosis not present

## 2021-06-25 DIAGNOSIS — D509 Iron deficiency anemia, unspecified: Secondary | ICD-10-CM | POA: Diagnosis not present

## 2021-06-25 DIAGNOSIS — Z79899 Other long term (current) drug therapy: Secondary | ICD-10-CM | POA: Diagnosis not present

## 2021-06-25 DIAGNOSIS — N186 End stage renal disease: Secondary | ICD-10-CM | POA: Diagnosis not present

## 2021-06-25 DIAGNOSIS — N2581 Secondary hyperparathyroidism of renal origin: Secondary | ICD-10-CM | POA: Diagnosis not present

## 2021-06-25 DIAGNOSIS — R82998 Other abnormal findings in urine: Secondary | ICD-10-CM | POA: Diagnosis not present

## 2021-06-25 DIAGNOSIS — K7689 Other specified diseases of liver: Secondary | ICD-10-CM | POA: Diagnosis not present

## 2021-06-26 DIAGNOSIS — N186 End stage renal disease: Secondary | ICD-10-CM | POA: Diagnosis not present

## 2021-06-26 DIAGNOSIS — I129 Hypertensive chronic kidney disease with stage 1 through stage 4 chronic kidney disease, or unspecified chronic kidney disease: Secondary | ICD-10-CM | POA: Diagnosis not present

## 2021-06-26 DIAGNOSIS — Z992 Dependence on renal dialysis: Secondary | ICD-10-CM | POA: Diagnosis not present

## 2021-06-26 DIAGNOSIS — K7689 Other specified diseases of liver: Secondary | ICD-10-CM | POA: Diagnosis not present

## 2021-06-26 DIAGNOSIS — R82998 Other abnormal findings in urine: Secondary | ICD-10-CM | POA: Diagnosis not present

## 2021-06-26 DIAGNOSIS — N2581 Secondary hyperparathyroidism of renal origin: Secondary | ICD-10-CM | POA: Diagnosis not present

## 2021-06-26 DIAGNOSIS — Z79899 Other long term (current) drug therapy: Secondary | ICD-10-CM | POA: Diagnosis not present

## 2021-06-26 DIAGNOSIS — D509 Iron deficiency anemia, unspecified: Secondary | ICD-10-CM | POA: Diagnosis not present

## 2021-06-27 ENCOUNTER — Other Ambulatory Visit (HOSPITAL_COMMUNITY)
Admission: RE | Admit: 2021-06-27 | Discharge: 2021-06-27 | Disposition: A | Discharge status: Home / Self Care | Payer: Medicare Other | Source: Ambulatory Visit | Attending: Infectious Diseases | Admitting: Infectious Diseases

## 2021-06-27 ENCOUNTER — Ambulatory Visit (INDEPENDENT_AMBULATORY_CARE_PROVIDER_SITE_OTHER): Payer: Medicare Other | Admitting: Infectious Diseases

## 2021-06-27 ENCOUNTER — Other Ambulatory Visit: Payer: Self-pay

## 2021-06-27 VITALS — BP 154/92 | HR 77 | Wt 214.0 lb

## 2021-06-27 DIAGNOSIS — Z992 Dependence on renal dialysis: Secondary | ICD-10-CM | POA: Diagnosis not present

## 2021-06-27 DIAGNOSIS — Z79899 Other long term (current) drug therapy: Secondary | ICD-10-CM

## 2021-06-27 DIAGNOSIS — B20 Human immunodeficiency virus [HIV] disease: Secondary | ICD-10-CM | POA: Diagnosis not present

## 2021-06-27 DIAGNOSIS — D509 Iron deficiency anemia, unspecified: Secondary | ICD-10-CM | POA: Diagnosis not present

## 2021-06-27 DIAGNOSIS — C642 Malignant neoplasm of left kidney, except renal pelvis: Secondary | ICD-10-CM

## 2021-06-27 DIAGNOSIS — Z113 Encounter for screening for infections with a predominantly sexual mode of transmission: Secondary | ICD-10-CM

## 2021-06-27 DIAGNOSIS — Z23 Encounter for immunization: Secondary | ICD-10-CM

## 2021-06-27 DIAGNOSIS — N2581 Secondary hyperparathyroidism of renal origin: Secondary | ICD-10-CM | POA: Diagnosis not present

## 2021-06-27 DIAGNOSIS — N186 End stage renal disease: Secondary | ICD-10-CM | POA: Diagnosis not present

## 2021-06-27 DIAGNOSIS — C649 Malignant neoplasm of unspecified kidney, except renal pelvis: Secondary | ICD-10-CM | POA: Insufficient documentation

## 2021-06-27 DIAGNOSIS — R82998 Other abnormal findings in urine: Secondary | ICD-10-CM | POA: Diagnosis not present

## 2021-06-27 NOTE — Assessment & Plan Note (Addendum)
He will f/u with Dr Hollie Salk. Greatly appreciate her f/u.  not clear his cancer dx will affect his listing for transplant.

## 2021-06-27 NOTE — Progress Notes (Signed)
Subjective:    Patient ID: Albert Eaton, male  DOB: 01/26/1966, 55 y.o.        MRN: [id number]   HPI 55 y.o. M with a history of CHF (nonischemic non-dilated CM with NYHA 2/C HF preserved EF 55% with moderate concentric hypertrophy and a grade 2 diastolic dysfunction. Moderate mitral valve insufficiency), CKD V (started HD 05-13-19), HTN, HLD, OSA, neuropathy, and HIV (dx 1991). Also chronic constipation (on amtyza and prn lactulose). Ashtma, GERD.  He had kidney bx 9-18 showing focal nephrosclerosis, interstitial fibrosis. Awaiting kidney txp.  No problems with fistula.  Taking Isentress, Epivir, abacavir --> biktarvy. No problems with this. Missed some doses last week- when he had hypotension with HD.  Was in ED last week with peritonitis for PD fluid Cx (was not able to be done at his dialysis center). Still has abd discomfort, always has.  Had nephrectomy 01-2021 due to ?cyst on his kidney. It was found to be Cancer. Surgery in [facility].   - RENAL CELL CARCINOMA, PAPILLARY RENAL CELL CARCINOMA , TYPE 1, pT1a pNx - RENAL CELL CARCINOMA, ACQUIRED CYSTIC DISEASE ASSOCIATED RENAL CELL CARCINOMA, pT1a, pNx.   Has f/u with oncology and repeat CT scan. 07-30-21.  Nephrologist- Dr Rodman Key.  PD cath is currently functioning well.  Has gotten COVID vax, wants flu vax today.   HIV 1 RNA Quant  Date Value  06/27/2020 <20 Copies/mL  10/06/2019 <20 NOT DETECTED copies/mL  11/22/2018 <20 NOT DETECTED copies/mL   CD4 T Cell Abs (/uL)  Date Value  06/27/2020 1,175  10/06/2019 916  11/22/2018 740     Health Maintenance  Topic Date Due  . Zoster Vaccines- Shingrix (1 of 2) Never done  . COVID-19 Vaccine (3 - Pfizer risk series) 02/29/2020  . COLONOSCOPY (Pts 45-35yr Insurance coverage will need to be confirmed)  05/02/2026  . TETANUS/TDAP  01/28/2027  . Pneumococcal Vaccine 07685Years old (4 - PPSV23 or PCV20) 03/29/2031  . Hepatitis C Screening  Completed  . HIV Screening  Completed  .  HPV VACCINES  Aged Out      Review of Systems  Constitutional:  Negative for chills, fever and weight loss.  Respiratory:  Negative for cough and shortness of breath.   Cardiovascular:  Negative for leg swelling.  Gastrointestinal:  Positive for constipation. Negative for diarrhea.  Genitourinary:  Negative for dysuria (minimal amt of urine).   Please see HPI. All other systems reviewed and negative.     Objective:  Physical Exam Constitutional:      General: He is not in acute distress.    Appearance: Normal appearance. He is obese. He is not ill-appearing, toxic-appearing or diaphoretic.  HENT:     Mouth/Throat:     Mouth: Mucous membranes are moist.     Pharynx: No oropharyngeal exudate.  Eyes:     Extraocular Movements: Extraocular movements intact.     Pupils: Pupils are equal, round, and reactive to light.  Cardiovascular:     Rate and Rhythm: Normal rate and regular rhythm.  Pulmonary:     Effort: Pulmonary effort is normal.     Breath sounds: Normal breath sounds.  Abdominal:     General: Bowel sounds are normal. There is distension.     Palpations: Abdomen is soft. There is no mass.     Tenderness: There is no abdominal tenderness (his PD cath is clean, non-tender.).     Hernia: No hernia is present.  Musculoskeletal:  Cervical back: Normal range of motion and neck supple.     Right lower leg: No edema.     Left lower leg: No edema.  Neurological:     General: No focal deficit present.     Mental Status: He is alert.           Assessment & Plan:

## 2021-06-27 NOTE — Assessment & Plan Note (Signed)
Has f/u at [facility], f/u Ct as well.

## 2021-06-27 NOTE — Assessment & Plan Note (Signed)
He is doing well Offered/refused condoms.  Has started mpox vax Hopefully his missed doses will not induce drug resistance.  Will call him with results Will see him in 6 months.

## 2021-06-28 ENCOUNTER — Telehealth: Payer: Self-pay

## 2021-06-28 DIAGNOSIS — Z79899 Other long term (current) drug therapy: Secondary | ICD-10-CM | POA: Diagnosis not present

## 2021-06-28 DIAGNOSIS — N186 End stage renal disease: Secondary | ICD-10-CM | POA: Diagnosis not present

## 2021-06-28 DIAGNOSIS — N2581 Secondary hyperparathyroidism of renal origin: Secondary | ICD-10-CM | POA: Diagnosis not present

## 2021-06-28 DIAGNOSIS — Z992 Dependence on renal dialysis: Secondary | ICD-10-CM | POA: Diagnosis not present

## 2021-06-28 DIAGNOSIS — R82998 Other abnormal findings in urine: Secondary | ICD-10-CM | POA: Diagnosis not present

## 2021-06-28 DIAGNOSIS — D509 Iron deficiency anemia, unspecified: Secondary | ICD-10-CM | POA: Diagnosis not present

## 2021-06-28 LAB — URINE CYTOLOGY ANCILLARY ONLY
Chlamydia: NEGATIVE
Comment: NEGATIVE
Comment: NORMAL
Neisseria Gonorrhea: NEGATIVE

## 2021-06-28 LAB — T-HELPER CELL (CD4) - (RCID CLINIC ONLY)
CD4 % Helper T Cell: 41 % (ref 33–65)
CD4 T Cell Abs: 686 /uL (ref 400–1790)

## 2021-06-28 NOTE — Telephone Encounter (Signed)
Received call from [facility] to relay creatinine of 15.70. Dr. Johnnye Sima already aware. Patient on dialysis.  Beryle Flock, RN

## 2021-06-30 LAB — CBC WITH DIFFERENTIAL/PLATELET
Absolute Monocytes: 363 cells/uL (ref 200–950)
Basophils Absolute: 39 cells/uL (ref 0–200)
Basophils Relative: 1 %
Eosinophils Absolute: 199 cells/uL (ref 15–500)
Eosinophils Relative: 5.1 %
HCT: 32.4 % — ABNORMAL LOW (ref 38.5–50.0)
Hemoglobin: 11.1 g/dL — ABNORMAL LOW (ref 13.2–17.1)
Lymphs Abs: 1615 cells/uL (ref 850–3900)
MCH: 31.8 pg (ref 27.0–33.0)
MCHC: 34.3 g/dL (ref 32.0–36.0)
MCV: 92.8 fL (ref 80.0–100.0)
MPV: 10.5 fL (ref 7.5–12.5)
Monocytes Relative: 9.3 %
Neutro Abs: 1685 cells/uL (ref 1500–7800)
Neutrophils Relative %: 43.2 %
Platelets: 315 10*3/uL (ref 140–400)
RBC: 3.49 10*6/uL — ABNORMAL LOW (ref 4.20–5.80)
RDW: 14.5 % (ref 11.0–15.0)
Total Lymphocyte: 41.4 %
WBC: 3.9 10*3/uL (ref 3.8–10.8)

## 2021-06-30 LAB — HIV-1 RNA QUANT-NO REFLEX-BLD
HIV 1 RNA Quant: NOT DETECTED Copies/mL
HIV-1 RNA Quant, Log: NOT DETECTED Log cps/mL

## 2021-06-30 LAB — LIPID PANEL
Cholesterol: 137 mg/dL (ref <200)
HDL: 33 mg/dL — ABNORMAL LOW (ref >=40)
LDL Cholesterol (Calc): 83 mg/dL (calc)
Non-HDL Cholesterol (Calc): 104 mg/dL (calc) (ref <130)
Total CHOL/HDL Ratio: 4.2 (calc) (ref <5.0)
Triglycerides: 114 mg/dL (ref <150)

## 2021-06-30 LAB — COMPREHENSIVE METABOLIC PANEL
AG Ratio: 1.6 (calc) (ref 1.0–2.5)
ALT: 16 U/L (ref 9–46)
AST: 13 U/L (ref 10–35)
Albumin: 5 g/dL (ref 3.6–5.1)
Alkaline phosphatase (APISO): 80 U/L (ref 35–144)
BUN/Creatinine Ratio: 5 (calc) — ABNORMAL LOW (ref 6–22)
BUN: 76 mg/dL — ABNORMAL HIGH (ref 7–25)
CO2: 24 mmol/L (ref 20–32)
Calcium: 10.4 mg/dL — ABNORMAL HIGH (ref 8.6–10.3)
Chloride: 101 mmol/L (ref 98–110)
Creat: 15.7 mg/dL — ABNORMAL HIGH (ref 0.70–1.30)
Globulin: 3.2 g/dL (calc) (ref 1.9–3.7)
Glucose, Bld: 90 mg/dL (ref 65–99)
Potassium: 3.8 mmol/L (ref 3.5–5.3)
Sodium: 145 mmol/L (ref 135–146)
Total Bilirubin: 0.5 mg/dL (ref 0.2–1.2)
Total Protein: 8.2 g/dL — ABNORMAL HIGH (ref 6.1–8.1)

## 2021-06-30 LAB — RPR: RPR Ser Ql: NONREACTIVE

## 2021-07-01 DIAGNOSIS — R82998 Other abnormal findings in urine: Secondary | ICD-10-CM | POA: Diagnosis not present

## 2021-07-01 DIAGNOSIS — N186 End stage renal disease: Secondary | ICD-10-CM | POA: Diagnosis not present

## 2021-07-01 DIAGNOSIS — D509 Iron deficiency anemia, unspecified: Secondary | ICD-10-CM | POA: Diagnosis not present

## 2021-07-01 DIAGNOSIS — N2581 Secondary hyperparathyroidism of renal origin: Secondary | ICD-10-CM | POA: Diagnosis not present

## 2021-07-01 DIAGNOSIS — Z79899 Other long term (current) drug therapy: Secondary | ICD-10-CM | POA: Diagnosis not present

## 2021-07-01 DIAGNOSIS — Z992 Dependence on renal dialysis: Secondary | ICD-10-CM | POA: Diagnosis not present

## 2021-07-02 DIAGNOSIS — R82998 Other abnormal findings in urine: Secondary | ICD-10-CM | POA: Diagnosis not present

## 2021-07-02 DIAGNOSIS — D509 Iron deficiency anemia, unspecified: Secondary | ICD-10-CM | POA: Diagnosis not present

## 2021-07-02 DIAGNOSIS — Z992 Dependence on renal dialysis: Secondary | ICD-10-CM | POA: Diagnosis not present

## 2021-07-02 DIAGNOSIS — N186 End stage renal disease: Secondary | ICD-10-CM | POA: Diagnosis not present

## 2021-07-02 DIAGNOSIS — Z79899 Other long term (current) drug therapy: Secondary | ICD-10-CM | POA: Diagnosis not present

## 2021-07-02 DIAGNOSIS — N2581 Secondary hyperparathyroidism of renal origin: Secondary | ICD-10-CM | POA: Diagnosis not present

## 2021-07-03 DIAGNOSIS — E876 Hypokalemia: Secondary | ICD-10-CM | POA: Diagnosis not present

## 2021-07-03 DIAGNOSIS — N186 End stage renal disease: Secondary | ICD-10-CM | POA: Diagnosis not present

## 2021-07-03 DIAGNOSIS — D631 Anemia in chronic kidney disease: Secondary | ICD-10-CM | POA: Diagnosis not present

## 2021-07-03 DIAGNOSIS — Z4932 Encounter for adequacy testing for peritoneal dialysis: Secondary | ICD-10-CM | POA: Diagnosis not present

## 2021-07-03 DIAGNOSIS — N2581 Secondary hyperparathyroidism of renal origin: Secondary | ICD-10-CM | POA: Diagnosis not present

## 2021-07-03 DIAGNOSIS — Z992 Dependence on renal dialysis: Secondary | ICD-10-CM | POA: Diagnosis not present

## 2021-07-04 DIAGNOSIS — Z992 Dependence on renal dialysis: Secondary | ICD-10-CM | POA: Diagnosis not present

## 2021-07-04 DIAGNOSIS — Z4932 Encounter for adequacy testing for peritoneal dialysis: Secondary | ICD-10-CM | POA: Diagnosis not present

## 2021-07-04 DIAGNOSIS — N186 End stage renal disease: Secondary | ICD-10-CM | POA: Diagnosis not present

## 2021-07-04 DIAGNOSIS — E876 Hypokalemia: Secondary | ICD-10-CM | POA: Diagnosis not present

## 2021-07-04 DIAGNOSIS — D631 Anemia in chronic kidney disease: Secondary | ICD-10-CM | POA: Diagnosis not present

## 2021-07-04 DIAGNOSIS — N2581 Secondary hyperparathyroidism of renal origin: Secondary | ICD-10-CM | POA: Diagnosis not present

## 2021-07-05 ENCOUNTER — Other Ambulatory Visit: Payer: Self-pay | Admitting: Infectious Diseases

## 2021-07-05 DIAGNOSIS — D631 Anemia in chronic kidney disease: Secondary | ICD-10-CM | POA: Diagnosis not present

## 2021-07-05 DIAGNOSIS — E876 Hypokalemia: Secondary | ICD-10-CM | POA: Diagnosis not present

## 2021-07-05 DIAGNOSIS — N186 End stage renal disease: Secondary | ICD-10-CM | POA: Diagnosis not present

## 2021-07-05 DIAGNOSIS — B20 Human immunodeficiency virus [HIV] disease: Secondary | ICD-10-CM

## 2021-07-05 DIAGNOSIS — Z4932 Encounter for adequacy testing for peritoneal dialysis: Secondary | ICD-10-CM | POA: Diagnosis not present

## 2021-07-05 DIAGNOSIS — Z992 Dependence on renal dialysis: Secondary | ICD-10-CM | POA: Diagnosis not present

## 2021-07-05 DIAGNOSIS — N2581 Secondary hyperparathyroidism of renal origin: Secondary | ICD-10-CM | POA: Diagnosis not present

## 2021-07-06 DIAGNOSIS — D631 Anemia in chronic kidney disease: Secondary | ICD-10-CM | POA: Diagnosis not present

## 2021-07-06 DIAGNOSIS — N186 End stage renal disease: Secondary | ICD-10-CM | POA: Diagnosis not present

## 2021-07-06 DIAGNOSIS — E876 Hypokalemia: Secondary | ICD-10-CM | POA: Diagnosis not present

## 2021-07-06 DIAGNOSIS — N2581 Secondary hyperparathyroidism of renal origin: Secondary | ICD-10-CM | POA: Diagnosis not present

## 2021-07-06 DIAGNOSIS — Z992 Dependence on renal dialysis: Secondary | ICD-10-CM | POA: Diagnosis not present

## 2021-07-06 DIAGNOSIS — Z4932 Encounter for adequacy testing for peritoneal dialysis: Secondary | ICD-10-CM | POA: Diagnosis not present

## 2021-07-07 DIAGNOSIS — E876 Hypokalemia: Secondary | ICD-10-CM | POA: Diagnosis not present

## 2021-07-07 DIAGNOSIS — N2581 Secondary hyperparathyroidism of renal origin: Secondary | ICD-10-CM | POA: Diagnosis not present

## 2021-07-07 DIAGNOSIS — Z4932 Encounter for adequacy testing for peritoneal dialysis: Secondary | ICD-10-CM | POA: Diagnosis not present

## 2021-07-07 DIAGNOSIS — D631 Anemia in chronic kidney disease: Secondary | ICD-10-CM | POA: Diagnosis not present

## 2021-07-07 DIAGNOSIS — N186 End stage renal disease: Secondary | ICD-10-CM | POA: Diagnosis not present

## 2021-07-07 DIAGNOSIS — Z992 Dependence on renal dialysis: Secondary | ICD-10-CM | POA: Diagnosis not present

## 2021-07-08 DIAGNOSIS — E876 Hypokalemia: Secondary | ICD-10-CM | POA: Diagnosis not present

## 2021-07-08 DIAGNOSIS — N2581 Secondary hyperparathyroidism of renal origin: Secondary | ICD-10-CM | POA: Diagnosis not present

## 2021-07-08 DIAGNOSIS — Z992 Dependence on renal dialysis: Secondary | ICD-10-CM | POA: Diagnosis not present

## 2021-07-08 DIAGNOSIS — Z4932 Encounter for adequacy testing for peritoneal dialysis: Secondary | ICD-10-CM | POA: Diagnosis not present

## 2021-07-08 DIAGNOSIS — N186 End stage renal disease: Secondary | ICD-10-CM | POA: Diagnosis not present

## 2021-07-08 DIAGNOSIS — D631 Anemia in chronic kidney disease: Secondary | ICD-10-CM | POA: Diagnosis not present

## 2021-07-09 DIAGNOSIS — Z992 Dependence on renal dialysis: Secondary | ICD-10-CM | POA: Diagnosis not present

## 2021-07-09 DIAGNOSIS — E876 Hypokalemia: Secondary | ICD-10-CM | POA: Diagnosis not present

## 2021-07-09 DIAGNOSIS — N186 End stage renal disease: Secondary | ICD-10-CM | POA: Diagnosis not present

## 2021-07-09 DIAGNOSIS — D631 Anemia in chronic kidney disease: Secondary | ICD-10-CM | POA: Diagnosis not present

## 2021-07-09 DIAGNOSIS — N2581 Secondary hyperparathyroidism of renal origin: Secondary | ICD-10-CM | POA: Diagnosis not present

## 2021-07-09 DIAGNOSIS — Z4932 Encounter for adequacy testing for peritoneal dialysis: Secondary | ICD-10-CM | POA: Diagnosis not present

## 2021-07-10 DIAGNOSIS — Z4932 Encounter for adequacy testing for peritoneal dialysis: Secondary | ICD-10-CM | POA: Diagnosis not present

## 2021-07-10 DIAGNOSIS — N2581 Secondary hyperparathyroidism of renal origin: Secondary | ICD-10-CM | POA: Diagnosis not present

## 2021-07-10 DIAGNOSIS — N186 End stage renal disease: Secondary | ICD-10-CM | POA: Diagnosis not present

## 2021-07-10 DIAGNOSIS — D631 Anemia in chronic kidney disease: Secondary | ICD-10-CM | POA: Diagnosis not present

## 2021-07-10 DIAGNOSIS — Z992 Dependence on renal dialysis: Secondary | ICD-10-CM | POA: Diagnosis not present

## 2021-07-10 DIAGNOSIS — E876 Hypokalemia: Secondary | ICD-10-CM | POA: Diagnosis not present

## 2021-07-11 DIAGNOSIS — Z4932 Encounter for adequacy testing for peritoneal dialysis: Secondary | ICD-10-CM | POA: Diagnosis not present

## 2021-07-11 DIAGNOSIS — N2581 Secondary hyperparathyroidism of renal origin: Secondary | ICD-10-CM | POA: Diagnosis not present

## 2021-07-11 DIAGNOSIS — D631 Anemia in chronic kidney disease: Secondary | ICD-10-CM | POA: Diagnosis not present

## 2021-07-11 DIAGNOSIS — E876 Hypokalemia: Secondary | ICD-10-CM | POA: Diagnosis not present

## 2021-07-11 DIAGNOSIS — N186 End stage renal disease: Secondary | ICD-10-CM | POA: Diagnosis not present

## 2021-07-11 DIAGNOSIS — Z992 Dependence on renal dialysis: Secondary | ICD-10-CM | POA: Diagnosis not present

## 2021-07-12 DIAGNOSIS — N2581 Secondary hyperparathyroidism of renal origin: Secondary | ICD-10-CM | POA: Diagnosis not present

## 2021-07-12 DIAGNOSIS — Z4932 Encounter for adequacy testing for peritoneal dialysis: Secondary | ICD-10-CM | POA: Diagnosis not present

## 2021-07-12 DIAGNOSIS — Z992 Dependence on renal dialysis: Secondary | ICD-10-CM | POA: Diagnosis not present

## 2021-07-12 DIAGNOSIS — E876 Hypokalemia: Secondary | ICD-10-CM | POA: Diagnosis not present

## 2021-07-12 DIAGNOSIS — D631 Anemia in chronic kidney disease: Secondary | ICD-10-CM | POA: Diagnosis not present

## 2021-07-12 DIAGNOSIS — N186 End stage renal disease: Secondary | ICD-10-CM | POA: Diagnosis not present

## 2021-07-13 ENCOUNTER — Other Ambulatory Visit: Payer: Self-pay | Admitting: Pulmonary Disease

## 2021-07-13 ENCOUNTER — Other Ambulatory Visit: Payer: Self-pay | Admitting: Family Medicine

## 2021-07-13 DIAGNOSIS — M109 Gout, unspecified: Secondary | ICD-10-CM

## 2021-07-13 DIAGNOSIS — D631 Anemia in chronic kidney disease: Secondary | ICD-10-CM | POA: Diagnosis not present

## 2021-07-13 DIAGNOSIS — N186 End stage renal disease: Secondary | ICD-10-CM | POA: Diagnosis not present

## 2021-07-13 DIAGNOSIS — E876 Hypokalemia: Secondary | ICD-10-CM | POA: Diagnosis not present

## 2021-07-13 DIAGNOSIS — N2581 Secondary hyperparathyroidism of renal origin: Secondary | ICD-10-CM | POA: Diagnosis not present

## 2021-07-13 DIAGNOSIS — Z992 Dependence on renal dialysis: Secondary | ICD-10-CM | POA: Diagnosis not present

## 2021-07-13 DIAGNOSIS — Z4932 Encounter for adequacy testing for peritoneal dialysis: Secondary | ICD-10-CM | POA: Diagnosis not present

## 2021-07-14 DIAGNOSIS — Z4932 Encounter for adequacy testing for peritoneal dialysis: Secondary | ICD-10-CM | POA: Diagnosis not present

## 2021-07-14 DIAGNOSIS — N186 End stage renal disease: Secondary | ICD-10-CM | POA: Diagnosis not present

## 2021-07-14 DIAGNOSIS — Z992 Dependence on renal dialysis: Secondary | ICD-10-CM | POA: Diagnosis not present

## 2021-07-14 DIAGNOSIS — N2581 Secondary hyperparathyroidism of renal origin: Secondary | ICD-10-CM | POA: Diagnosis not present

## 2021-07-14 DIAGNOSIS — E876 Hypokalemia: Secondary | ICD-10-CM | POA: Diagnosis not present

## 2021-07-14 DIAGNOSIS — D631 Anemia in chronic kidney disease: Secondary | ICD-10-CM | POA: Diagnosis not present

## 2021-07-15 DIAGNOSIS — D631 Anemia in chronic kidney disease: Secondary | ICD-10-CM | POA: Diagnosis not present

## 2021-07-15 DIAGNOSIS — Z992 Dependence on renal dialysis: Secondary | ICD-10-CM | POA: Diagnosis not present

## 2021-07-15 DIAGNOSIS — N186 End stage renal disease: Secondary | ICD-10-CM | POA: Diagnosis not present

## 2021-07-15 DIAGNOSIS — Z4932 Encounter for adequacy testing for peritoneal dialysis: Secondary | ICD-10-CM | POA: Diagnosis not present

## 2021-07-15 DIAGNOSIS — E876 Hypokalemia: Secondary | ICD-10-CM | POA: Diagnosis not present

## 2021-07-15 DIAGNOSIS — N2581 Secondary hyperparathyroidism of renal origin: Secondary | ICD-10-CM | POA: Diagnosis not present

## 2021-07-16 ENCOUNTER — Emergency Department (HOSPITAL_COMMUNITY): Payer: Medicare Other

## 2021-07-16 ENCOUNTER — Other Ambulatory Visit: Payer: Self-pay

## 2021-07-16 ENCOUNTER — Ambulatory Visit: Payer: Medicare Other | Admitting: Nurse Practitioner

## 2021-07-16 ENCOUNTER — Emergency Department (HOSPITAL_COMMUNITY)
Admission: EM | Admit: 2021-07-16 | Discharge: 2021-07-17 | Disposition: A | Discharge status: Home / Self Care | Payer: Medicare Other | Attending: Emergency Medicine | Admitting: Emergency Medicine

## 2021-07-16 DIAGNOSIS — Z992 Dependence on renal dialysis: Secondary | ICD-10-CM | POA: Diagnosis not present

## 2021-07-16 DIAGNOSIS — R102 Pelvic and perineal pain: Secondary | ICD-10-CM

## 2021-07-16 DIAGNOSIS — N3289 Other specified disorders of bladder: Secondary | ICD-10-CM | POA: Diagnosis not present

## 2021-07-16 DIAGNOSIS — Z87891 Personal history of nicotine dependence: Secondary | ICD-10-CM | POA: Insufficient documentation

## 2021-07-16 DIAGNOSIS — Z79899 Other long term (current) drug therapy: Secondary | ICD-10-CM | POA: Insufficient documentation

## 2021-07-16 DIAGNOSIS — I5032 Chronic diastolic (congestive) heart failure: Secondary | ICD-10-CM | POA: Insufficient documentation

## 2021-07-16 DIAGNOSIS — R300 Dysuria: Secondary | ICD-10-CM

## 2021-07-16 DIAGNOSIS — N2581 Secondary hyperparathyroidism of renal origin: Secondary | ICD-10-CM | POA: Diagnosis not present

## 2021-07-16 DIAGNOSIS — Z85528 Personal history of other malignant neoplasm of kidney: Secondary | ICD-10-CM | POA: Insufficient documentation

## 2021-07-16 DIAGNOSIS — Z7982 Long term (current) use of aspirin: Secondary | ICD-10-CM | POA: Diagnosis not present

## 2021-07-16 DIAGNOSIS — N186 End stage renal disease: Secondary | ICD-10-CM | POA: Insufficient documentation

## 2021-07-16 DIAGNOSIS — E039 Hypothyroidism, unspecified: Secondary | ICD-10-CM | POA: Insufficient documentation

## 2021-07-16 DIAGNOSIS — R1031 Right lower quadrant pain: Secondary | ICD-10-CM | POA: Diagnosis not present

## 2021-07-16 DIAGNOSIS — D631 Anemia in chronic kidney disease: Secondary | ICD-10-CM | POA: Diagnosis not present

## 2021-07-16 DIAGNOSIS — R1030 Lower abdominal pain, unspecified: Secondary | ICD-10-CM | POA: Insufficient documentation

## 2021-07-16 DIAGNOSIS — Z4932 Encounter for adequacy testing for peritoneal dialysis: Secondary | ICD-10-CM | POA: Diagnosis not present

## 2021-07-16 DIAGNOSIS — Z21 Asymptomatic human immunodeficiency virus [HIV] infection status: Secondary | ICD-10-CM | POA: Diagnosis not present

## 2021-07-16 DIAGNOSIS — I700 Atherosclerosis of aorta: Secondary | ICD-10-CM | POA: Diagnosis not present

## 2021-07-16 DIAGNOSIS — R309 Painful micturition, unspecified: Secondary | ICD-10-CM | POA: Diagnosis not present

## 2021-07-16 DIAGNOSIS — I10 Essential (primary) hypertension: Secondary | ICD-10-CM | POA: Diagnosis not present

## 2021-07-16 DIAGNOSIS — E876 Hypokalemia: Secondary | ICD-10-CM | POA: Diagnosis not present

## 2021-07-16 DIAGNOSIS — I132 Hypertensive heart and chronic kidney disease with heart failure and with stage 5 chronic kidney disease, or end stage renal disease: Secondary | ICD-10-CM | POA: Diagnosis not present

## 2021-07-16 LAB — URINALYSIS, ROUTINE W REFLEX MICROSCOPIC
Bilirubin Urine: NEGATIVE
Glucose, UA: NEGATIVE mg/dL
Hgb urine dipstick: NEGATIVE
Ketones, ur: NEGATIVE mg/dL
Nitrite: NEGATIVE
Protein, ur: >300 mg/dL — AB
Specific Gravity, Urine: 1.025 (ref 1.005–1.030)
pH: 5.5 (ref 5.0–8.0)

## 2021-07-16 LAB — CBC WITH DIFFERENTIAL/PLATELET
Abs Immature Granulocytes: 0.03 10*3/uL (ref 0.00–0.07)
Basophils Absolute: 0 10*3/uL (ref 0.0–0.1)
Basophils Relative: 1 %
Eosinophils Absolute: 0.2 10*3/uL (ref 0.0–0.5)
Eosinophils Relative: 3 %
HCT: 29.2 % — ABNORMAL LOW (ref 39.0–52.0)
Hemoglobin: 10.4 g/dL — ABNORMAL LOW (ref 13.0–17.0)
Immature Granulocytes: 1 %
Lymphocytes Relative: 32 %
Lymphs Abs: 1.9 10*3/uL (ref 0.7–4.0)
MCH: 33 pg (ref 26.0–34.0)
MCHC: 35.6 g/dL (ref 30.0–36.0)
MCV: 92.7 fL (ref 80.0–100.0)
Monocytes Absolute: 0.7 10*3/uL (ref 0.1–1.0)
Monocytes Relative: 12 %
Neutro Abs: 3.2 10*3/uL (ref 1.7–7.7)
Neutrophils Relative %: 51 %
Platelets: 277 10*3/uL (ref 150–400)
RBC: 3.15 MIL/uL — ABNORMAL LOW (ref 4.22–5.81)
RDW: 14.1 % (ref 11.5–15.5)
WBC: 6.1 10*3/uL (ref 4.0–10.5)
nRBC: 0 % (ref 0.0–0.2)

## 2021-07-16 LAB — COMPREHENSIVE METABOLIC PANEL
ALT: 15 U/L (ref 0–44)
AST: 16 U/L (ref 15–41)
Albumin: 3.7 g/dL (ref 3.5–5.0)
Alkaline Phosphatase: 64 U/L (ref 38–126)
Anion gap: 19 — ABNORMAL HIGH (ref 5–15)
BUN: 55 mg/dL — ABNORMAL HIGH (ref 6–20)
CO2: 22 mmol/L (ref 22–32)
Calcium: 9.7 mg/dL (ref 8.9–10.3)
Chloride: 93 mmol/L — ABNORMAL LOW (ref 98–111)
Creatinine, Ser: 14.78 mg/dL — ABNORMAL HIGH (ref 0.61–1.24)
GFR, Estimated: 4 mL/min — ABNORMAL LOW (ref >60)
Glucose, Bld: 111 mg/dL — ABNORMAL HIGH (ref 70–99)
Potassium: 2.8 mmol/L — ABNORMAL LOW (ref 3.5–5.1)
Sodium: 134 mmol/L — ABNORMAL LOW (ref 135–145)
Total Bilirubin: 0.4 mg/dL (ref 0.3–1.2)
Total Protein: 7.5 g/dL (ref 6.5–8.1)

## 2021-07-16 LAB — LIPASE, BLOOD: Lipase: 82 U/L — ABNORMAL HIGH (ref 11–51)

## 2021-07-16 LAB — URINALYSIS, MICROSCOPIC (REFLEX)
Bacteria, UA: NONE SEEN
Squamous Epithelial / HPF: NONE SEEN (ref 0–5)

## 2021-07-16 IMAGING — CT CT ABD-PELV W/O CM
2 of 4 series · 16 of 46 positions shown, 18 images · non-contrast
Comparison: None.

CLINICAL DATA: Abdominal pain with urination and lower pelvic pain
for 3 days

EXAM:
CT ABDOMEN AND PELVIS WITHOUT CONTRAST
TECHNIQUE: Multidetector CT imaging of the abdomen and pelvis was performed
following the standard protocol without IV contrast.

[Series 3: a/p w/o 5mm · axial · non-contrast · 0.83mm/px · z∈[-452,-42]mm · 13 of 90 slices shown, 15 images]
[im 4/90  soft-tissue]
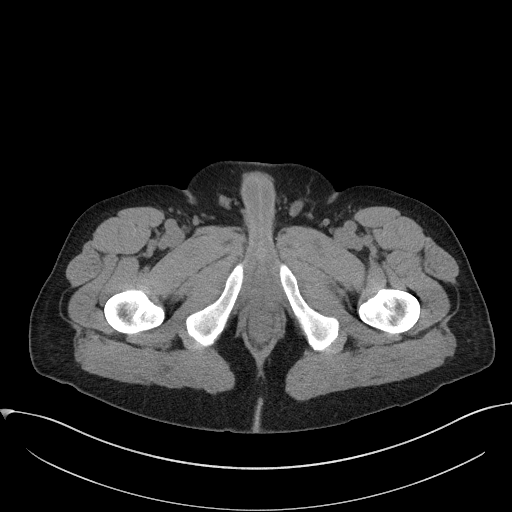
[im 4/90  bone]
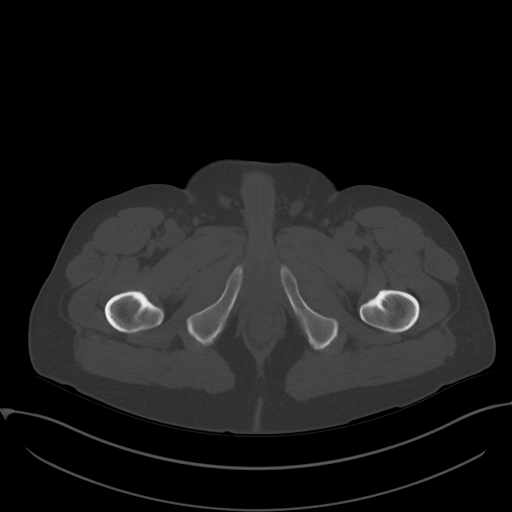
[im 12/90  soft-tissue]
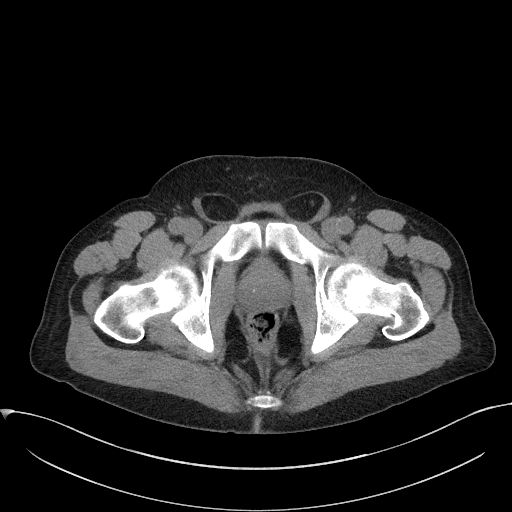
[im 19/90  soft-tissue]
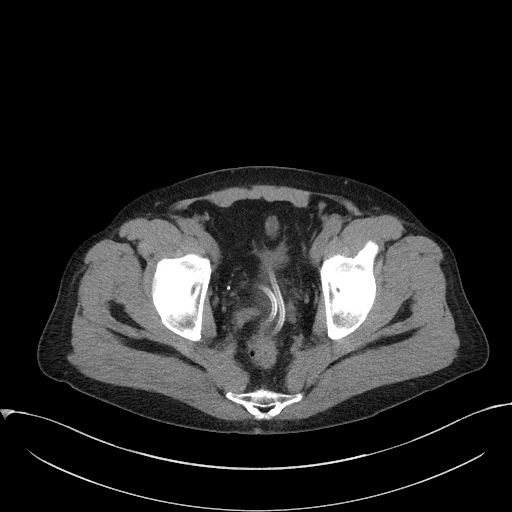
[im 26/90  soft-tissue]
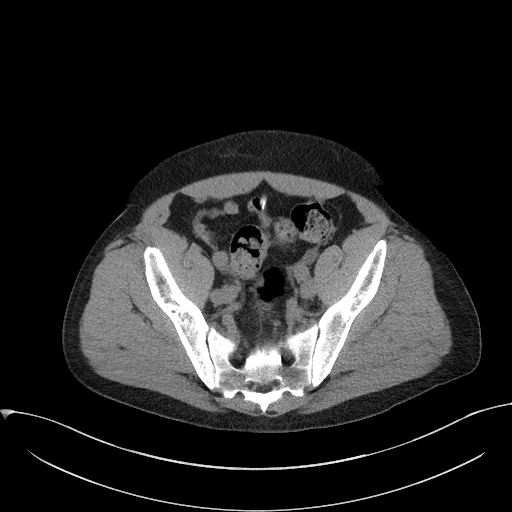
[im 30/90  soft-tissue]
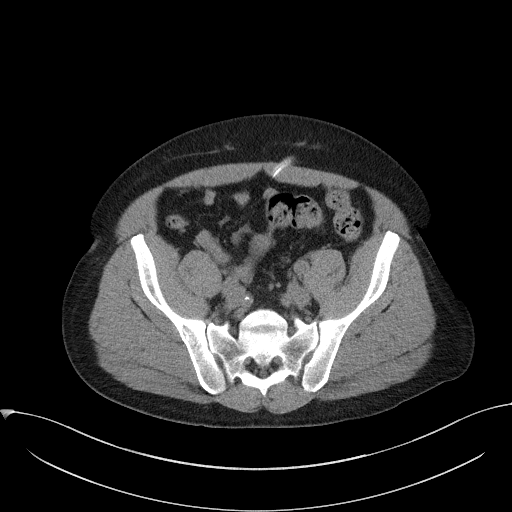
[im 38/90  soft-tissue]
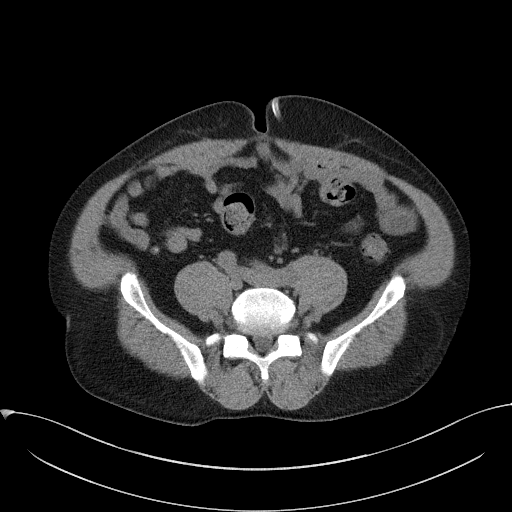
[im 45/90  soft-tissue]
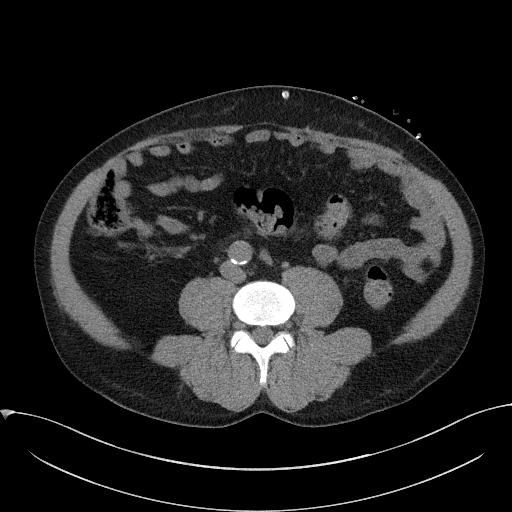
[im 52/90  soft-tissue]
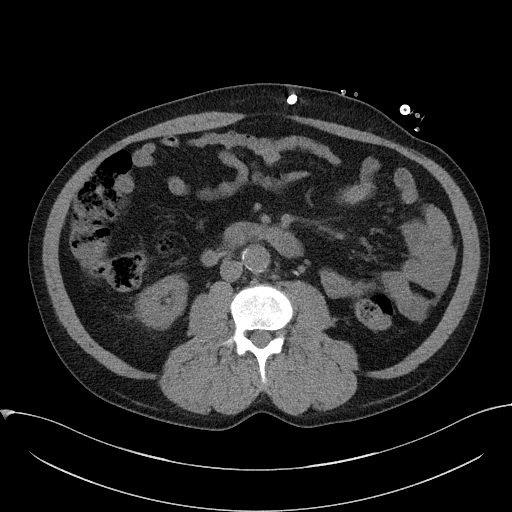
[im 60/90  soft-tissue]
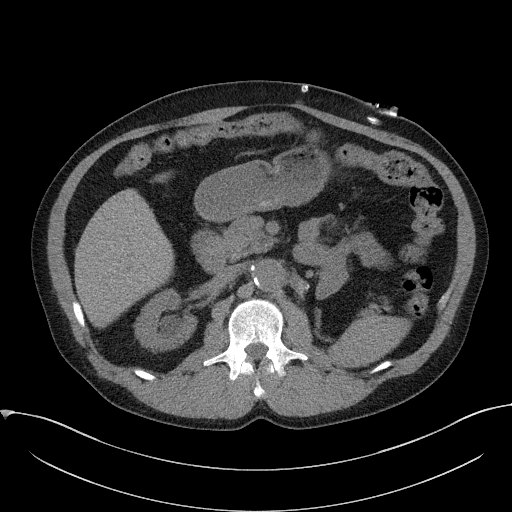
[im 60/90  bone]
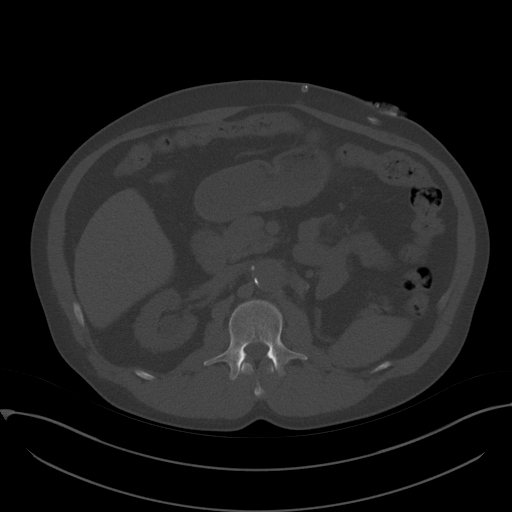
[im 64/90  soft-tissue]
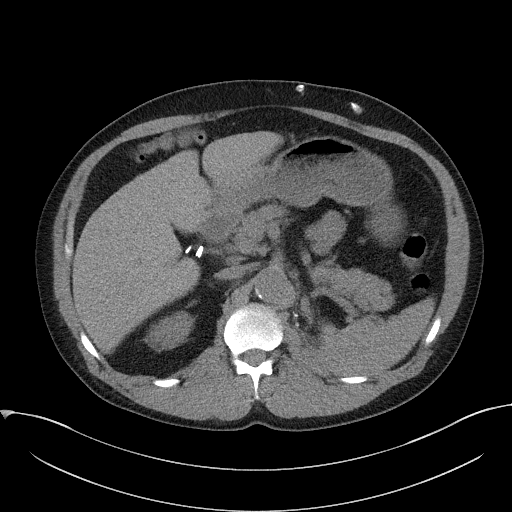
[im 71/90  soft-tissue]
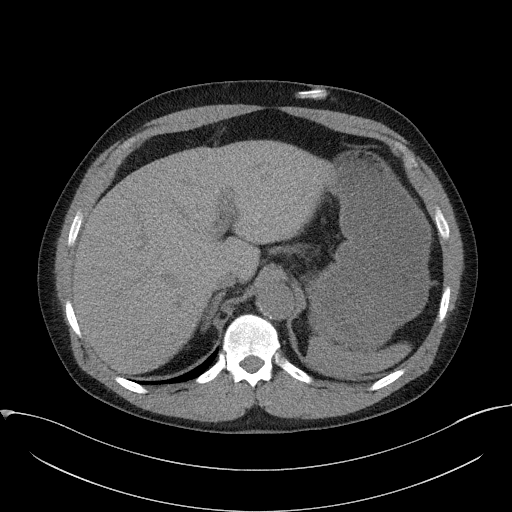
[im 78/90  soft-tissue]
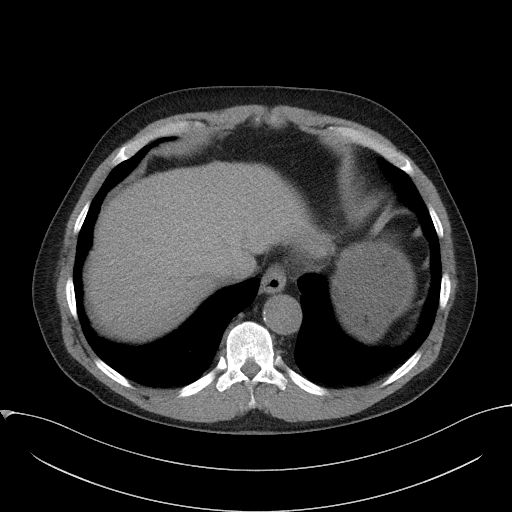
[im 86/90  soft-tissue]
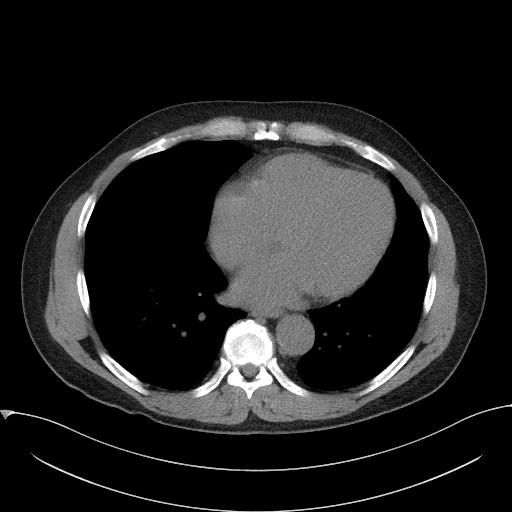

[Series 6: a/p w/o cor · coronal · non-contrast · 0.75mm/px · 3 of 151 slices shown]
[im 51/151  soft-tissue]
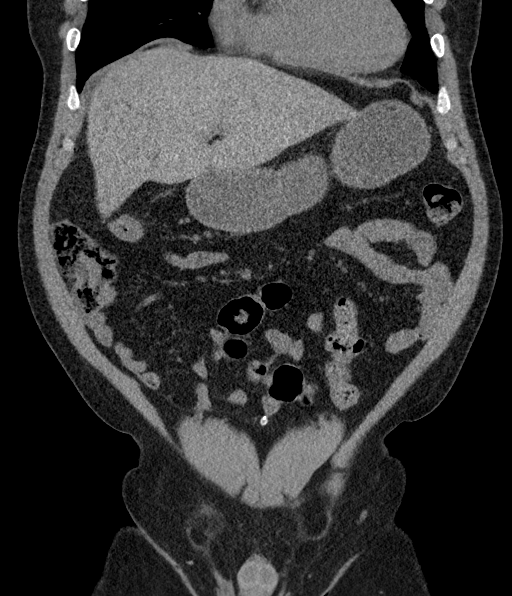
[im 67/151  soft-tissue]
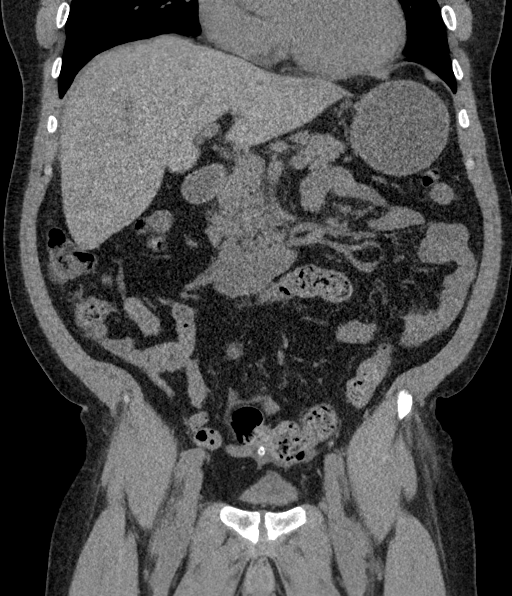
[im 84/151  soft-tissue]
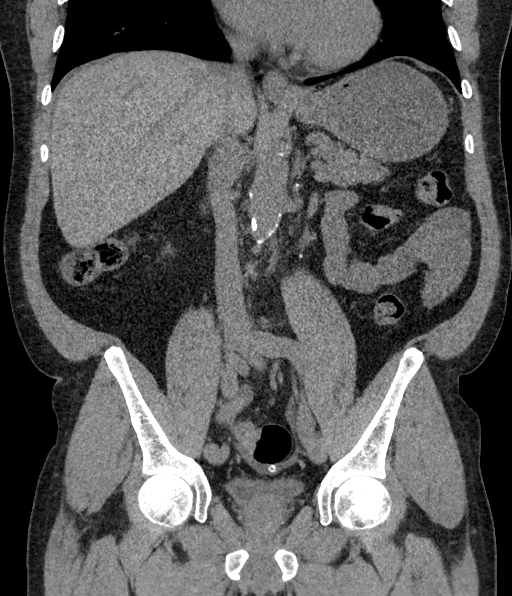

[16 of 46 positions shown; findings below may reference images not displayed]

FINDINGS: Lower chest: No acute abnormality.

Hepatobiliary: No focal liver abnormality is seen. Status post
cholecystectomy. No biliary dilatation.

Pancreas: Unremarkable. No pancreatic ductal dilatation or
surrounding inflammatory changes.

Spleen: Normal in size without significant abnormality.

Adrenals/Urinary Tract: Adrenal glands are unremarkable. Status post
left nephrectomy. The right kidney is normal, without renal calculi,
solid lesion, or hydronephrosis. Thickening of the decompressed
urinary bladder (series 7, image 102)

Stomach/Bowel: Stomach is within normal limits. Appendix appears
normal. No evidence of bowel wall thickening, distention, or
inflammatory changes.

Vascular/Lymphatic: Aortic atherosclerosis. No enlarged abdominal or
pelvic lymph nodes.

Reproductive: Prostatomegaly.

Other: No abdominal wall hernia or abnormality. Small volume ascites
in the pelvis. Tenckhoff type peritoneal dialysis catheter in the
low abdomen (series 3, image 70)

Musculoskeletal: No acute or significant osseous findings.
IMPRESSION: 1. No definite non-contrast CT findings of the abdomen or pelvis to
explain pain.
2. Thickening of the decompressed urinary bladder, suggestive of
nonspecific infectious or inflammatory cystitis, although possibly
related to chronic outlet obstruction in the setting of
prostatomegaly. Correlate with urinalysis.
3. Tenckhoff type peritoneal dialysis catheter in the low abdomen.
Small volume ascites in the pelvis, likely peritoneal dialysate.

Aortic Atherosclerosis ([EN]-[EN]).

## 2021-07-16 MED ORDER — CEPHALEXIN 500 MG PO CAPS: 500.0000 mg | ORAL_CAPSULE | Freq: Two times a day (BID) | ORAL | 0 refills | Status: DC

## 2021-07-16 MED ORDER — HYDROCODONE-ACETAMINOPHEN 5-325 MG PO TABS
2.0000 | ORAL_TABLET | Freq: Once | ORAL | Status: AC
Start: 2021-07-16 — End: 2021-07-16
  Administered 2021-07-16: 2 via ORAL
  Filled 2021-07-16: qty 2

## 2021-07-16 MED ORDER — CEPHALEXIN 250 MG PO CAPS
500.0000 mg | ORAL_CAPSULE | Freq: Once | ORAL | Status: AC
  Administered 2021-07-16: 500 mg via ORAL
  Filled 2021-07-16: qty 2

## 2021-07-16 NOTE — ED Provider Notes (Signed)
Emergency Medicine Provider Triage Evaluation Note  Albert Eaton , a 55 y.o. male  was evaluated in triage.  Pt complains of lower abd pain and pain with urination. PD dialysis pt. Has some pain when at the end of his dialysis. No fever, emesis, back pain. No hematuria, kidney stone. Feels like not completely emptying bladder. States had "bladder" infection last month. No hx of prostate issues  Review of Systems  Positive: Abd pain,  dysuria Negative: Fever, emesis, back pain  Physical Exam  There were no vitals taken for this visit. Gen:   Awake, no distress   Resp:  Normal effort  MSK:   Moves extremities without difficulty  ABD:  Mild suprapubic tender. PD cath to left abd Other:    Medical Decision Making  Medically screening exam initiated at 2:09 PM.  Appropriate orders placed.  Albert Eaton was informed that the remainder of the evaluation will be completed by another provider, this initial triage assessment does not replace that evaluation, and the importance of remaining in the ED until their evaluation is complete.  Abd pain, dysuria, peritoneal dialysis patient   Albert Elm, PA-C 07/16/21 1412    Albert Hidden, MD 07/16/21 [phone]

## 2021-07-16 NOTE — ED Triage Notes (Signed)
Pt arrived c/o pain with urination and lower pelvic pain for 3 days. Pain is constant and gets worse with urination Denies blood in urine   Denies fevers and chills.

## 2021-07-16 NOTE — Discharge Instructions (Signed)
It is unclear what is causing your symptoms, the CT scan would suggest some evidence of bladder infection.  This in association with your reported urinary symptoms is suggestive of UTI or bladder infection.  We are treating you with antibiotics for this.  Another potential diagnosis is SBP, or spontaneous bacterial peritonitis, this can be an infection of the fluid in your abdomen.  If your symptoms worsen, you run a high fever, or your abdominal pain worsens, you need to return to the emergency department.  If you are not improving over the next few days on the antibiotics, I recommend that you be seen by your primary care doctor.  Your potassium was noted to be slightly low at 2.8, I recommend that you have your potassium rechecked in the next 1 to 2 days.

## 2021-07-16 NOTE — ED Provider Notes (Signed)
[facility] EMERGENCY DEPARTMENT Provider Note   CSN: [id number] Arrival date & time: 07/16/21  1345     History Chief Complaint  Patient presents with   Abdominal Pain    Albert Eaton is a 55 y.o. male.  Patient presents to the emergency department with a chief complaint of constant, moderate, nonradiating suprapubic abdominal pain and dysuria.  He states that the symptoms started about 3 days ago.  He denies any fever, chills, nausea, or vomiting today, but states that he did have some vomiting back on Saturday.  He states that he does peritoneal dialysis.  He reports history of bladder infection, and states that this feels similar.  The history is provided by the patient. No language interpreter was used.      Past Medical History:  Diagnosis Date   Anemia    Aortic atherosclerosis (HCC)    Arthritis    Asthma    Cancer ([place])    renal cyst   Chronic diastolic CHF (congestive heart failure) ([place]) 01/06/2017   Echo 7/16 [facility] in [place], [name]) Mild AI, mild LAE, mild concentric LVH, EF 55, normal wall motion, mild to moderate MR, mild PI, RVSP 55 // Echo 10/09/16 ([facility]):  Moderate LVH, grade 2 diastolic dysfunction, mild MR, moderate LAE    CKD (chronic kidney disease)    Dialysis Mon Wed Fri   Esophagitis    GERD (gastroesophageal reflux disease)    Gout    no current problems   Heart murmur    never caused any problems   Hepatitis    Hep B   History of nuclear stress test    a. Nuc study 7/16: no scar or ischemia, EF 42 // b. Nuc study 12/17: EF 48, ?small apical ischemia, Low Risk   HIV (human immunodeficiency virus infection) ([place])    HLD (hyperlipidemia)    Hypertension    Hypertensive heart disease with CHF (congestive heart failure) ([place]) 10/09/2016   Hypothyroidism    Mitral regurgitation    OSA (obstructive sleep apnea)    Post-operative nausea and vomiting    1 time as a child, no problems as an adult   Sleep apnea     uses c-pap   Thyroid disease    Wears glasses    Wears partial dentures     Patient Active Problem List   Diagnosis Date Noted   Renal cell carcinoma ([place]) 06/27/2021   SVT (supraventricular tachycardia) ([place]) 01/18/2021   Lower abdominal pain 06/26/2020   Pulsatile tinnitus of right ear 11/21/2019   Kidney failure 97/67/3419   Acute diastolic CHF (congestive heart failure) ([place]) 11/21/2019   Chills (without fever) 11/07/2019   Hypercalcemia 10/17/2019   Dependence on renal dialysis ([place]) 06/06/2019   Coagulation defect, unspecified ([place]) 05/23/2019   Iron deficiency anemia, unspecified 05/16/2019   Anemia in other chronic diseases classified elsewhere 05/13/2019   Arteriovenous fistula, acquired ([place]) 05/13/2019   Body mass index (BMI) 28.0-28.9, adult 05/13/2019   Crohn's disease of small intestine without complications ([place]) 37/90/2409   Encounter for screening for respiratory tuberculosis 05/13/2019   Gout, unspecified 05/13/2019   Nausea 05/13/2019   Other fatigue 05/13/2019   Secondary hyperparathyroidism of renal origin ([place]) 05/13/2019   End stage renal disease ([place]) 05/13/2019   Chronic diastolic (congestive) heart failure ([place]) 05/13/2019   Chronic kidney disease, unspecified 05/13/2019   Obstructive sleep apnea 05/13/2019   Discomfort of right ear 02/18/2019   Hypokalemia 06/13/2018   Abnormal CT  scan, small bowel    Perennial allergic rhinitis with a predominant nonallergic component 02/09/2018   Allergic conjunctivitis 02/09/2018   Mild intermittent asthma 02/09/2018   Atherosclerosis 12/09/2017   Hypothyroidism 11/19/2017   Mixed hyperlipidemia 10/13/2017   Personal history of gout 10/13/2017   S/P cholecystectomy 10/13/2017   Congestive heart failure with LV diastolic dysfunction, NYHA class 1 ([place]) 10/13/2017   Hypothyroidism (acquired) 10/13/2017   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   SOB (shortness of breath) 06/07/2017   Arthritis, gouty  01/27/2017   Chronic diastolic CHF (congestive heart failure) ([place]) 01/06/2017   OSA (obstructive sleep apnea) 01/06/2017   Abnormal electrocardiogram (ECG) (EKG) 10/31/2016   Chest pain 10/09/2016   HIV disease ([place]) 10/09/2016   Hypertensive heart disease with CHF (congestive heart failure) ([place]) 10/09/2016   Left-sided low back pain without sciatica 05/29/2015   NYHA class 2 heart failure with preserved ejection fraction ([place]) 05/15/2015   PAH (pulmonary artery hypertension) ([place]) 05/08/2015   Severe mitral regurgitation 05/04/2015   Prolonged Q-T interval on ECG 05/04/2015    Past Surgical History:  Procedure Laterality Date   AV FISTULA PLACEMENT Left 07/02/2018   Procedure: Creation of Left arm BRACHIOBASILIC ARTERIOVENOUS  FISTULA;  Surgeon: Marty Heck, MD;  Location: [facility];  Service: Vascular;  Laterality: Left;   [place] Left 08/27/2018   Procedure: Left arm BRACHIOBASILIC VEIN TRANSPOSITION SECOND STAGE;  Surgeon: Marty Heck, MD;  Location: [facility];  Service: Vascular;  Laterality: Left;   BUBBLE STUDY  09/03/2020   Procedure: BUBBLE STUDY;  Surgeon: Fay Records, MD;  Location: [facility];  Service: Cardiovascular;;   CAPD INSERTION N/A 05/30/2021   Procedure: LAPAROSCOPIC INSERTION CONTINUOUS AMBULATORY PERITONEAL DIALYSIS  (CAPD) CATHETER;  Surgeon: [name], MD;  Location: [facility];  Service: Vascular;  Laterality: N/A;   CHOLECYSTECTOMY     COLONOSCOPY W/ BIOPSIES AND POLYPECTOMY     GIVENS CAPSULE STUDY N/A 03/01/2018   Procedure: GIVENS CAPSULE STUDY;  Surgeon: Jerene Bears, MD;  Location: [facility];  Service: Gastroenterology;  Laterality: N/A;   HERNIA REPAIR     As baby   MULTIPLE TOOTH EXTRACTIONS     NEPHRECTOMY Left 02/18/2021   NOSE SURGERY     RENAL BIOPSY     TEE WITHOUT CARDIOVERSION N/A 09/03/2020   Procedure: TRANSESOPHAGEAL ECHOCARDIOGRAM (TEE);  Surgeon: Fay Records, MD;  Location[facility] ENDOSCOPY;   Service: Cardiovascular;  Laterality: N/A;   UPPER GI ENDOSCOPY  04/05/2021       Family History  Problem Relation Age of Onset   Heart failure Mother    Heart attack Maternal Grandmother 86   Hypertension Sister    Multiple sclerosis Sister    Hypertension Brother    Hypertension Sister    Scoliosis Other    Allergic rhinitis Neg Hx    Angioedema Neg Hx    Asthma Neg Hx    Eczema Neg Hx    Immunodeficiency Neg Hx    Urticaria Neg Hx    Colon cancer Neg Hx    Pancreatic cancer Neg Hx    Esophageal cancer Neg Hx     Social History   Tobacco Use   Smoking status: Former    Packs/day: 1.00    Types: Cigarettes    Quit date: 2000    Years since quitting: 22.7   Smokeless tobacco: Never  Vaping Use   Vaping Use: Never used  Substance Use Topics   Alcohol  use: Yes    Comment: occasionally liquor   Drug use: No    Home Medications Prior to Admission medications   Medication Sig Start Date End Date Taking? Authorizing Provider  ACETAMINOPHEN EXTRA STRENGTH 500 MG tablet Take 500 mg by mouth every 6 (six) hours as needed (pain.). 02/20/21   [provider]  albuterol (VENTOLIN HFA) 108 (90 Base) MCG/ACT inhaler INHALE 2 PUFFS INTO THE LUNGS EVERY 4 HOURS AS NEEDED FOR WHEEZING OR SHORTNESS OF BREATH Patient taking differently: Inhale 2 puffs into the lungs every 4 (four) hours as needed for wheezing or shortness of breath. 05/27/21   Valentina Shaggy, MD  allopurinol (ZYLOPRIM) 100 MG tablet TAKE 1 TABLET(100 MG) BY MOUTH TWICE DAILY FOR 60 DOSES 07/15/21   [name], FNP  amLODipine (NORVASC) 10 MG tablet Take 1 tablet (10 mg total) by mouth daily. Patient taking differently: Take 10 mg by mouth daily at 12 noon. At midday 10/31/16   [name], FNP  aspirin EC 81 MG tablet Take 1 tablet (81 mg total) by mouth daily. Swallow whole. Patient taking differently: Take 81 mg by mouth in the morning. Swallow whole. 03/06/21   Richardson Dopp T, PA-C   azelastine (ASTELIN) 0.1 % nasal spray PLACE 2 SPRAYS INTO EACH NOSTRIL TWICE DAILY AS NEEDED FOR RHINITIS AS DIRECTED Patient taking differently: Place 2 sprays into both nostrils 2 (two) times daily as needed for allergies or rhinitis. 06/05/21   Valentina Shaggy, MD  BIKTARVY [phone] MG TABS tablet TAKE 1 TABLET BY MOUTH DAILY 07/08/21   Campbell Riches, MD  calcitRIOL (ROCALTROL) 0.5 MCG capsule Take 0.5 mcg by mouth every Monday, Wednesday, and Friday. 02/06/21   [provider]  carvedilol (COREG) 25 MG tablet TAKE 1 TABLET BY MOUTH TWICE DAILY WITH FOOD Patient taking differently: Take 25 mg by mouth 2 (two) times daily with a meal. 12/31/20   [name], MD  chlorproMAZINE (THORAZINE) 10 MG tablet Take 1 tablet (10 mg total) by mouth 3 (three) times daily as needed for hiccoughs. Patient taking differently: Take 10 mg by mouth 3 (three) times daily. 02/26/21   [name], FNP  clonazePAM (KLONOPIN) 0.5 MG tablet Take 1 tablet (0.5 mg total) by mouth at bedtime. 01/02/21   Laurin Coder, MD  clotrimazole-betamethasone (LOTRISONE) cream Apply topically 2 (two) times daily. X 3-5 days. IF symptoms continue, call office Patient taking differently: Apply 1 application topically 2 (two) times daily as needed (rectal discomfort). 05/16/21   Pyrtle, Lajuan Lines, MD  docusate sodium (COLACE) 100 MG capsule Take 100 mg by mouth 2 (two) times daily. 02/20/21   [provider]  fluticasone (FLOVENT HFA) 110 MCG/ACT inhaler INHALE 2 PUFFS INTO THE LUNGS TWICE DAILY 06/24/21   Valentina Shaggy, MD  gabapentin (NEURONTIN) 300 MG capsule TAKE 1 CAPSULE(300 MG) BY MOUTH THREE TIMES DAILY Patient taking differently: Take 300 mg by mouth 3 (three) times daily. 10/09/20   [name], FNP  gentamicin cream (GARAMYCIN) 0.1 % Apply 1 application topically See admin instructions. Apply when catheter is flushed and cleaned 05/28/21   [provider]  hydrALAZINE  (APRESOLINE) 100 MG tablet Take 1 tablet (100 mg total) by mouth 3 (three) times daily. 04/02/21 04/02/22  [name], MD  hyoscyamine (LEVSIN SL) 0.125 MG SL tablet Place 1-2 tablets (0.125-0.25 mg total) under the tongue every 8 (eight) hours as needed (lower abdominal pain, spasm). 08/05/19   Pyrtle, Lajuan Lines,  MD  ipratropium (ATROVENT) 0.06 % nasal spray USE 2 SPRAYS IN EACH NOSTRIL 2 TO 3 TIMES DAILY AS NEEDED Patient taking differently: Place 2 sprays into both nostrils 3 (three) times daily as needed (allergies). 09/03/20   Valentina Shaggy, MD  iron sucrose (VENOFER) 20 MG/ML injection Inject into the vein. 07/12/20   [provider]  lactulose (CHRONULAC) 10 GM/15ML solution Take 20 g by mouth daily as needed for moderate constipation.  07/08/20   [provider]  levothyroxine (SYNTHROID) 25 MCG tablet TAKE 1 TABLET BY MOUTH DAILY BEFORE BREAKFAST Patient taking differently: Take 25 mcg by mouth daily before breakfast. 02/05/21   [name], FNP  lidocaine-prilocaine (EMLA) cream Apply 1 application topically See admin instructions. Apply to access site 1-2 hours before dialysis 06/01/19   [provider]  lubiprostone (AMITIZA) 24 MCG capsule TAKE 1 CAPSULE(24 MCG) BY MOUTH TWICE DAILY WITH A MEAL Patient taking differently: Take 24 mcg by mouth in the morning and at bedtime. 05/21/21   Pyrtle, Lajuan Lines, MD  methocarbamol (ROBAXIN) 500 MG tablet Take 1 tablet (500 mg total) by mouth every 8 (eight) hours as needed for muscle spasms. Patient taking differently: Take 500 mg by mouth at bedtime. 02/11/21   Maudie Flakes, MD  Methoxy PEG-Epoetin Beta (MIRCERA IJ) Inject into the skin. 11/08/20   [provider]  multivitamin (RENA-VIT) TABS tablet Take 1 tablet by mouth at bedtime. 01/10/21   [provider]  mupirocin ointment (BACTROBAN) 2 % Apply 1 application topically daily as needed (apply to port after dialysis).  04/24/20   [provider]  Olopatadine HCl (PAZEO) 0.7 % SOLN Place 1 drop into both eyes daily. Patient taking differently: Place 1 drop into both eyes daily as needed (itchy eyes). 05/24/20   Valentina Shaggy, MD  oxyCODONE-acetaminophen (PERCOCET) 10-325 MG tablet Take 1 tablet by mouth every 6 (six) hours as needed for pain. 05/30/21 05/30/22  Setzer, Edman Circle, PA-C  pantoprazole (PROTONIX) 40 MG tablet Take 1 tablet (40 mg total) by mouth 2 (two) times daily before a meal. 04/04/21 05/30/23  Esterwood, Amy S, PA-C  rOPINIRole (REQUIP) 0.5 MG tablet Take 0.5 mg by mouth at bedtime. 02/05/21   [provider]  rosuvastatin (CRESTOR) 10 MG tablet Take 1 tablet (10 mg total) by mouth daily. Patient taking differently: Take 10 mg by mouth in the morning. 03/06/21 06/17/21  Richardson Dopp T, PA-C  senna (SENOKOT) 8.6 MG TABS tablet Take 2 tablets by mouth every evening. 02/20/21   [provider]  sevelamer carbonate (RENVELA) 800 MG tablet Take 1,600 mg by mouth 3 (three) times daily with meals. 11/09/20   [provider]  Spacer/Aero-Holding Josiah Lobo DEVI 1 Device by Does not apply route 2 (two) times a day. 03/01/19   Bobbitt, Sedalia Muta, MD  Vitamin D, Ergocalciferol, (DRISDOL) 1.25 MG (50000 UNIT) CAPS capsule TAKE 1 CAPSULE BY MOUTH EVERY 7 DAYS Patient taking differently: Take 50,000 Units by mouth every Tuesday. At midday 02/05/21   [name], FNP    Allergies    Ace inhibitors and Lisinopril  Review of Systems   Review of Systems  All other systems reviewed and are negative.  Physical Exam Updated Vital Signs BP (!) 166/98 (BP Location: Right Arm)   Pulse 76   Temp 98 F (36.7 C)   Resp 18   Ht 5' 10"  (1.778 m)   Wt 89.4 kg   SpO2 100%  BMI 28.27 kg/m   Physical Exam Vitals and nursing note reviewed.  Constitutional:      Appearance: He is well-developed.  HENT:     Head: Normocephalic and atraumatic.  Eyes:     Conjunctiva/sclera: Conjunctivae normal.   Cardiovascular:     Rate and Rhythm: Normal rate and regular rhythm.     Heart sounds: No murmur heard. Pulmonary:     Effort: Pulmonary effort is normal. No respiratory distress.     Breath sounds: Normal breath sounds.  Abdominal:     Palpations: Abdomen is soft.     Tenderness: There is no abdominal tenderness.     Comments: PD catheter in place, CDI Mild suprapubic abdominal tenderness  Musculoskeletal:        General: Normal range of motion.     Cervical back: Neck supple.  Skin:    General: Skin is warm and dry.  Neurological:     Mental Status: He is alert and oriented to person, place, and time.  Psychiatric:        Mood and Affect: Mood normal.        Behavior: Behavior normal.    ED Results / Procedures / Treatments   Labs (all labs ordered are listed, but only abnormal results are displayed) Labs Reviewed  CBC WITH DIFFERENTIAL/PLATELET - Abnormal; Notable for the following components:      Result Value   RBC 3.15 (*)    Hemoglobin 10.4 (*)    HCT 29.2 (*)    All other components within normal limits  COMPREHENSIVE METABOLIC PANEL - Abnormal; Notable for the following components:   Sodium 134 (*)    Potassium 2.8 (*)    Chloride 93 (*)    Glucose, Bld 111 (*)    BUN 55 (*)    Creatinine, Ser 14.78 (*)    GFR, Estimated 4 (*)    Anion gap 19 (*)    All other components within normal limits  LIPASE, BLOOD - Abnormal; Notable for the following components:   Lipase 82 (*)    All other components within normal limits  URINALYSIS, ROUTINE W REFLEX MICROSCOPIC - Abnormal; Notable for the following components:   Protein, ur >300 (*)    Leukocytes,Ua TRACE (*)    All other components within normal limits  URINALYSIS, MICROSCOPIC (REFLEX)    EKG None  Radiology CT Abdomen Pelvis Wo Contrast  Result Date: 07/16/2021 CLINICAL DATA:  Abdominal pain with urination and lower pelvic pain for 3 days EXAM: CT ABDOMEN AND PELVIS WITHOUT CONTRAST TECHNIQUE:  Multidetector CT imaging of the abdomen and pelvis was performed following the standard protocol without IV contrast. COMPARISON:  None. FINDINGS: Lower chest: No acute abnormality. Hepatobiliary: No focal liver abnormality is seen. Status post cholecystectomy. No biliary dilatation. Pancreas: Unremarkable. No pancreatic ductal dilatation or surrounding inflammatory changes. Spleen: Normal in size without significant abnormality. Adrenals/Urinary Tract: Adrenal glands are unremarkable. Status post left nephrectomy. The right kidney is normal, without renal calculi, solid lesion, or hydronephrosis. Thickening of the decompressed urinary bladder (series 7, image 102) Stomach/Bowel: Stomach is within normal limits. Appendix appears normal. No evidence of bowel wall thickening, distention, or inflammatory changes. Vascular/Lymphatic: Aortic atherosclerosis. No enlarged abdominal or pelvic lymph nodes. Reproductive: Prostatomegaly. Other: No abdominal wall hernia or abnormality. Small volume ascites in the pelvis. Tenckhoff type peritoneal dialysis catheter in the low abdomen (series 3, image 70) Musculoskeletal: No acute or significant osseous findings. IMPRESSION: 1. No definite non-contrast CT findings of the abdomen  or pelvis to explain pain. 2. Thickening of the decompressed urinary bladder, suggestive of nonspecific infectious or inflammatory cystitis, although possibly related to chronic outlet obstruction in the setting of prostatomegaly. Correlate with urinalysis. 3. Tenckhoff type peritoneal dialysis catheter in the low abdomen. Small volume ascites in the pelvis, likely peritoneal dialysate. Aortic Atherosclerosis (ICD10-I70.0). Electronically Signed   By: Eddie Candle M.D.   On: 07/16/2021 15:05    Procedures Procedures   Medications Ordered in ED Medications  HYDROcodone-acetaminophen (NORCO/VICODIN) 5-325 MG per tablet 2 tablet (has no administration in time range)  cephALEXin (KEFLEX) capsule 500  mg (has no administration in time range)    ED Course  I have reviewed the triage vital signs and the nursing notes.  Pertinent labs & imaging results that were available during my care of the patient were reviewed by me and considered in my medical decision making (see chart for details).    MDM Rules/Calculators/A&P                           Patient here with dysuria and suprapubic pain for the past few days.  Has history of bladder infection.  States that this feels similar.  He denies any fever, chills, nausea, or vomiting with the exception of an episode of vomiting on Saturday (about 5 days ago).  He denies any complications with his peritoneal dialysis.  Laboratory work-up is notable for no leukocytosis.  He is noted to have hypokalemia to 2.8.  EKG shows no ischemic changes.  Perhaps subtle U-waves in V2/3. Patient is afebrile and nontoxic in appearance.  I discussed with the patient that its unclear the etiology of his pain, but I suspect that it is cystitis based on patient's dysuria and CT scan showing inflammation of the bladder.  We also discussed that I cannot definitively rule out SBP, but based on patient's clinical appearance, I think this is less likely given the length of symptoms and how well the patient looks.  I offered admission to the hospital, but the patient declines this.  He states that he will return if his symptoms worsen.  He understands that we do not have a clear diagnosis, but understands the treatment plan.  I have asked that the patient follow-up with his doctor or dialysis center in the next day or 2 to have his potassium rechecked.  We discussed these results as well.  Patient appears stable at discharge, all questions have been answered, and patient understands and agrees with the discharge plan. Final Clinical Impression(s) / ED Diagnoses Final diagnoses:  Dysuria  Suprapubic pain    Rx / DC Orders ED Discharge Orders          Ordered     cephALEXin (KEFLEX) 500 MG capsule  2 times daily        07/16/21 2328             Montine Circle, PA-C 07/16/21 2341    Valarie Merino, MD 07/18/21 1326

## 2021-07-17 ENCOUNTER — Telehealth: Payer: Self-pay | Admitting: Pulmonary Disease

## 2021-07-17 DIAGNOSIS — Z992 Dependence on renal dialysis: Secondary | ICD-10-CM | POA: Diagnosis not present

## 2021-07-17 DIAGNOSIS — E876 Hypokalemia: Secondary | ICD-10-CM | POA: Diagnosis not present

## 2021-07-17 DIAGNOSIS — Z4932 Encounter for adequacy testing for peritoneal dialysis: Secondary | ICD-10-CM | POA: Diagnosis not present

## 2021-07-17 DIAGNOSIS — N2581 Secondary hyperparathyroidism of renal origin: Secondary | ICD-10-CM | POA: Diagnosis not present

## 2021-07-17 DIAGNOSIS — D631 Anemia in chronic kidney disease: Secondary | ICD-10-CM | POA: Diagnosis not present

## 2021-07-17 DIAGNOSIS — N186 End stage renal disease: Secondary | ICD-10-CM | POA: Diagnosis not present

## 2021-07-17 NOTE — Telephone Encounter (Signed)
Attempted to call pt but unable to reach. Left message for him to return call. °

## 2021-07-18 DIAGNOSIS — Z992 Dependence on renal dialysis: Secondary | ICD-10-CM | POA: Diagnosis not present

## 2021-07-18 DIAGNOSIS — D631 Anemia in chronic kidney disease: Secondary | ICD-10-CM | POA: Diagnosis not present

## 2021-07-18 DIAGNOSIS — Z4932 Encounter for adequacy testing for peritoneal dialysis: Secondary | ICD-10-CM | POA: Diagnosis not present

## 2021-07-18 DIAGNOSIS — E876 Hypokalemia: Secondary | ICD-10-CM | POA: Diagnosis not present

## 2021-07-18 DIAGNOSIS — N2581 Secondary hyperparathyroidism of renal origin: Secondary | ICD-10-CM | POA: Diagnosis not present

## 2021-07-18 DIAGNOSIS — N186 End stage renal disease: Secondary | ICD-10-CM | POA: Diagnosis not present

## 2021-07-19 DIAGNOSIS — N186 End stage renal disease: Secondary | ICD-10-CM | POA: Diagnosis not present

## 2021-07-19 DIAGNOSIS — Z992 Dependence on renal dialysis: Secondary | ICD-10-CM | POA: Diagnosis not present

## 2021-07-19 DIAGNOSIS — N2581 Secondary hyperparathyroidism of renal origin: Secondary | ICD-10-CM | POA: Diagnosis not present

## 2021-07-19 DIAGNOSIS — D631 Anemia in chronic kidney disease: Secondary | ICD-10-CM | POA: Diagnosis not present

## 2021-07-19 DIAGNOSIS — E876 Hypokalemia: Secondary | ICD-10-CM | POA: Diagnosis not present

## 2021-07-19 DIAGNOSIS — Z4932 Encounter for adequacy testing for peritoneal dialysis: Secondary | ICD-10-CM | POA: Diagnosis not present

## 2021-07-20 DIAGNOSIS — N186 End stage renal disease: Secondary | ICD-10-CM | POA: Diagnosis not present

## 2021-07-20 DIAGNOSIS — Z992 Dependence on renal dialysis: Secondary | ICD-10-CM | POA: Diagnosis not present

## 2021-07-20 DIAGNOSIS — Z4932 Encounter for adequacy testing for peritoneal dialysis: Secondary | ICD-10-CM | POA: Diagnosis not present

## 2021-07-20 DIAGNOSIS — E876 Hypokalemia: Secondary | ICD-10-CM | POA: Diagnosis not present

## 2021-07-20 DIAGNOSIS — D631 Anemia in chronic kidney disease: Secondary | ICD-10-CM | POA: Diagnosis not present

## 2021-07-20 DIAGNOSIS — N2581 Secondary hyperparathyroidism of renal origin: Secondary | ICD-10-CM | POA: Diagnosis not present

## 2021-07-21 DIAGNOSIS — E876 Hypokalemia: Secondary | ICD-10-CM | POA: Diagnosis not present

## 2021-07-21 DIAGNOSIS — D631 Anemia in chronic kidney disease: Secondary | ICD-10-CM | POA: Diagnosis not present

## 2021-07-21 DIAGNOSIS — Z992 Dependence on renal dialysis: Secondary | ICD-10-CM | POA: Diagnosis not present

## 2021-07-21 DIAGNOSIS — N186 End stage renal disease: Secondary | ICD-10-CM | POA: Diagnosis not present

## 2021-07-21 DIAGNOSIS — Z4932 Encounter for adequacy testing for peritoneal dialysis: Secondary | ICD-10-CM | POA: Diagnosis not present

## 2021-07-21 DIAGNOSIS — N2581 Secondary hyperparathyroidism of renal origin: Secondary | ICD-10-CM | POA: Diagnosis not present

## 2021-07-22 DIAGNOSIS — E876 Hypokalemia: Secondary | ICD-10-CM | POA: Diagnosis not present

## 2021-07-22 DIAGNOSIS — D631 Anemia in chronic kidney disease: Secondary | ICD-10-CM | POA: Diagnosis not present

## 2021-07-22 DIAGNOSIS — Z4932 Encounter for adequacy testing for peritoneal dialysis: Secondary | ICD-10-CM | POA: Diagnosis not present

## 2021-07-22 DIAGNOSIS — N2581 Secondary hyperparathyroidism of renal origin: Secondary | ICD-10-CM | POA: Diagnosis not present

## 2021-07-22 DIAGNOSIS — Z992 Dependence on renal dialysis: Secondary | ICD-10-CM | POA: Diagnosis not present

## 2021-07-22 DIAGNOSIS — N186 End stage renal disease: Secondary | ICD-10-CM | POA: Diagnosis not present

## 2021-07-23 DIAGNOSIS — N2581 Secondary hyperparathyroidism of renal origin: Secondary | ICD-10-CM | POA: Diagnosis not present

## 2021-07-23 DIAGNOSIS — Z992 Dependence on renal dialysis: Secondary | ICD-10-CM | POA: Diagnosis not present

## 2021-07-23 DIAGNOSIS — Z4932 Encounter for adequacy testing for peritoneal dialysis: Secondary | ICD-10-CM | POA: Diagnosis not present

## 2021-07-23 DIAGNOSIS — D631 Anemia in chronic kidney disease: Secondary | ICD-10-CM | POA: Diagnosis not present

## 2021-07-23 DIAGNOSIS — E876 Hypokalemia: Secondary | ICD-10-CM | POA: Diagnosis not present

## 2021-07-23 DIAGNOSIS — N186 End stage renal disease: Secondary | ICD-10-CM | POA: Diagnosis not present

## 2021-07-24 DIAGNOSIS — D631 Anemia in chronic kidney disease: Secondary | ICD-10-CM | POA: Diagnosis not present

## 2021-07-24 DIAGNOSIS — E876 Hypokalemia: Secondary | ICD-10-CM | POA: Diagnosis not present

## 2021-07-24 DIAGNOSIS — Z992 Dependence on renal dialysis: Secondary | ICD-10-CM | POA: Diagnosis not present

## 2021-07-24 DIAGNOSIS — Z4932 Encounter for adequacy testing for peritoneal dialysis: Secondary | ICD-10-CM | POA: Diagnosis not present

## 2021-07-24 DIAGNOSIS — N2581 Secondary hyperparathyroidism of renal origin: Secondary | ICD-10-CM | POA: Diagnosis not present

## 2021-07-24 DIAGNOSIS — N186 End stage renal disease: Secondary | ICD-10-CM | POA: Diagnosis not present

## 2021-07-25 DIAGNOSIS — Z4932 Encounter for adequacy testing for peritoneal dialysis: Secondary | ICD-10-CM | POA: Diagnosis not present

## 2021-07-25 DIAGNOSIS — N2581 Secondary hyperparathyroidism of renal origin: Secondary | ICD-10-CM | POA: Diagnosis not present

## 2021-07-25 DIAGNOSIS — N186 End stage renal disease: Secondary | ICD-10-CM | POA: Diagnosis not present

## 2021-07-25 DIAGNOSIS — D631 Anemia in chronic kidney disease: Secondary | ICD-10-CM | POA: Diagnosis not present

## 2021-07-25 DIAGNOSIS — Z992 Dependence on renal dialysis: Secondary | ICD-10-CM | POA: Diagnosis not present

## 2021-07-25 DIAGNOSIS — E876 Hypokalemia: Secondary | ICD-10-CM | POA: Diagnosis not present

## 2021-07-26 DIAGNOSIS — Z992 Dependence on renal dialysis: Secondary | ICD-10-CM | POA: Diagnosis not present

## 2021-07-26 DIAGNOSIS — Z4932 Encounter for adequacy testing for peritoneal dialysis: Secondary | ICD-10-CM | POA: Diagnosis not present

## 2021-07-26 DIAGNOSIS — I129 Hypertensive chronic kidney disease with stage 1 through stage 4 chronic kidney disease, or unspecified chronic kidney disease: Secondary | ICD-10-CM | POA: Diagnosis not present

## 2021-07-26 DIAGNOSIS — N186 End stage renal disease: Secondary | ICD-10-CM | POA: Diagnosis not present

## 2021-07-26 DIAGNOSIS — N2581 Secondary hyperparathyroidism of renal origin: Secondary | ICD-10-CM | POA: Diagnosis not present

## 2021-07-26 DIAGNOSIS — E876 Hypokalemia: Secondary | ICD-10-CM | POA: Diagnosis not present

## 2021-07-26 DIAGNOSIS — D631 Anemia in chronic kidney disease: Secondary | ICD-10-CM | POA: Diagnosis not present

## 2021-07-27 DIAGNOSIS — Z4932 Encounter for adequacy testing for peritoneal dialysis: Secondary | ICD-10-CM | POA: Diagnosis not present

## 2021-07-27 DIAGNOSIS — N186 End stage renal disease: Secondary | ICD-10-CM | POA: Diagnosis not present

## 2021-07-27 DIAGNOSIS — D509 Iron deficiency anemia, unspecified: Secondary | ICD-10-CM | POA: Diagnosis not present

## 2021-07-27 DIAGNOSIS — N2581 Secondary hyperparathyroidism of renal origin: Secondary | ICD-10-CM | POA: Diagnosis not present

## 2021-07-27 DIAGNOSIS — Z79899 Other long term (current) drug therapy: Secondary | ICD-10-CM | POA: Diagnosis not present

## 2021-07-27 DIAGNOSIS — Z992 Dependence on renal dialysis: Secondary | ICD-10-CM | POA: Diagnosis not present

## 2021-07-27 DIAGNOSIS — R82998 Other abnormal findings in urine: Secondary | ICD-10-CM | POA: Diagnosis not present

## 2021-07-28 DIAGNOSIS — Z79899 Other long term (current) drug therapy: Secondary | ICD-10-CM | POA: Diagnosis not present

## 2021-07-28 DIAGNOSIS — N186 End stage renal disease: Secondary | ICD-10-CM | POA: Diagnosis not present

## 2021-07-28 DIAGNOSIS — D509 Iron deficiency anemia, unspecified: Secondary | ICD-10-CM | POA: Diagnosis not present

## 2021-07-28 DIAGNOSIS — Z992 Dependence on renal dialysis: Secondary | ICD-10-CM | POA: Diagnosis not present

## 2021-07-28 DIAGNOSIS — N2581 Secondary hyperparathyroidism of renal origin: Secondary | ICD-10-CM | POA: Diagnosis not present

## 2021-07-28 DIAGNOSIS — R82998 Other abnormal findings in urine: Secondary | ICD-10-CM | POA: Diagnosis not present

## 2021-07-28 DIAGNOSIS — Z4932 Encounter for adequacy testing for peritoneal dialysis: Secondary | ICD-10-CM | POA: Diagnosis not present

## 2021-07-29 ENCOUNTER — Encounter: Payer: Self-pay | Admitting: Infectious Diseases

## 2021-07-29 DIAGNOSIS — Z992 Dependence on renal dialysis: Secondary | ICD-10-CM | POA: Diagnosis not present

## 2021-07-29 DIAGNOSIS — Z79899 Other long term (current) drug therapy: Secondary | ICD-10-CM | POA: Diagnosis not present

## 2021-07-29 DIAGNOSIS — Z4932 Encounter for adequacy testing for peritoneal dialysis: Secondary | ICD-10-CM | POA: Diagnosis not present

## 2021-07-29 DIAGNOSIS — D509 Iron deficiency anemia, unspecified: Secondary | ICD-10-CM | POA: Diagnosis not present

## 2021-07-29 DIAGNOSIS — N186 End stage renal disease: Secondary | ICD-10-CM | POA: Diagnosis not present

## 2021-07-29 DIAGNOSIS — R82998 Other abnormal findings in urine: Secondary | ICD-10-CM | POA: Diagnosis not present

## 2021-07-29 DIAGNOSIS — N2581 Secondary hyperparathyroidism of renal origin: Secondary | ICD-10-CM | POA: Diagnosis not present

## 2021-07-30 DIAGNOSIS — D509 Iron deficiency anemia, unspecified: Secondary | ICD-10-CM | POA: Diagnosis not present

## 2021-07-30 DIAGNOSIS — Z79899 Other long term (current) drug therapy: Secondary | ICD-10-CM | POA: Diagnosis not present

## 2021-07-30 DIAGNOSIS — N186 End stage renal disease: Secondary | ICD-10-CM | POA: Diagnosis not present

## 2021-07-30 DIAGNOSIS — N2581 Secondary hyperparathyroidism of renal origin: Secondary | ICD-10-CM | POA: Diagnosis not present

## 2021-07-30 DIAGNOSIS — Z992 Dependence on renal dialysis: Secondary | ICD-10-CM | POA: Diagnosis not present

## 2021-07-30 DIAGNOSIS — Z4932 Encounter for adequacy testing for peritoneal dialysis: Secondary | ICD-10-CM | POA: Diagnosis not present

## 2021-07-30 DIAGNOSIS — R82998 Other abnormal findings in urine: Secondary | ICD-10-CM | POA: Diagnosis not present

## 2021-07-31 DIAGNOSIS — R82998 Other abnormal findings in urine: Secondary | ICD-10-CM | POA: Diagnosis not present

## 2021-07-31 DIAGNOSIS — Z992 Dependence on renal dialysis: Secondary | ICD-10-CM | POA: Diagnosis not present

## 2021-07-31 DIAGNOSIS — N2581 Secondary hyperparathyroidism of renal origin: Secondary | ICD-10-CM | POA: Diagnosis not present

## 2021-07-31 DIAGNOSIS — D509 Iron deficiency anemia, unspecified: Secondary | ICD-10-CM | POA: Diagnosis not present

## 2021-07-31 DIAGNOSIS — N186 End stage renal disease: Secondary | ICD-10-CM | POA: Diagnosis not present

## 2021-07-31 DIAGNOSIS — Z79899 Other long term (current) drug therapy: Secondary | ICD-10-CM | POA: Diagnosis not present

## 2021-07-31 DIAGNOSIS — Z4932 Encounter for adequacy testing for peritoneal dialysis: Secondary | ICD-10-CM | POA: Diagnosis not present

## 2021-08-01 DIAGNOSIS — R82998 Other abnormal findings in urine: Secondary | ICD-10-CM | POA: Diagnosis not present

## 2021-08-01 DIAGNOSIS — Z79899 Other long term (current) drug therapy: Secondary | ICD-10-CM | POA: Diagnosis not present

## 2021-08-01 DIAGNOSIS — Z4932 Encounter for adequacy testing for peritoneal dialysis: Secondary | ICD-10-CM | POA: Diagnosis not present

## 2021-08-01 DIAGNOSIS — N2581 Secondary hyperparathyroidism of renal origin: Secondary | ICD-10-CM | POA: Diagnosis not present

## 2021-08-01 DIAGNOSIS — N186 End stage renal disease: Secondary | ICD-10-CM | POA: Diagnosis not present

## 2021-08-01 DIAGNOSIS — D509 Iron deficiency anemia, unspecified: Secondary | ICD-10-CM | POA: Diagnosis not present

## 2021-08-01 DIAGNOSIS — Z992 Dependence on renal dialysis: Secondary | ICD-10-CM | POA: Diagnosis not present

## 2021-08-02 DIAGNOSIS — D509 Iron deficiency anemia, unspecified: Secondary | ICD-10-CM | POA: Diagnosis not present

## 2021-08-02 DIAGNOSIS — N2581 Secondary hyperparathyroidism of renal origin: Secondary | ICD-10-CM | POA: Diagnosis not present

## 2021-08-02 DIAGNOSIS — Z992 Dependence on renal dialysis: Secondary | ICD-10-CM | POA: Diagnosis not present

## 2021-08-02 DIAGNOSIS — Z79899 Other long term (current) drug therapy: Secondary | ICD-10-CM | POA: Diagnosis not present

## 2021-08-02 DIAGNOSIS — Z4932 Encounter for adequacy testing for peritoneal dialysis: Secondary | ICD-10-CM | POA: Diagnosis not present

## 2021-08-02 DIAGNOSIS — R82998 Other abnormal findings in urine: Secondary | ICD-10-CM | POA: Diagnosis not present

## 2021-08-02 DIAGNOSIS — N186 End stage renal disease: Secondary | ICD-10-CM | POA: Diagnosis not present

## 2021-08-03 DIAGNOSIS — Z4932 Encounter for adequacy testing for peritoneal dialysis: Secondary | ICD-10-CM | POA: Diagnosis not present

## 2021-08-03 DIAGNOSIS — R82998 Other abnormal findings in urine: Secondary | ICD-10-CM | POA: Diagnosis not present

## 2021-08-03 DIAGNOSIS — Z992 Dependence on renal dialysis: Secondary | ICD-10-CM | POA: Diagnosis not present

## 2021-08-03 DIAGNOSIS — D509 Iron deficiency anemia, unspecified: Secondary | ICD-10-CM | POA: Diagnosis not present

## 2021-08-03 DIAGNOSIS — N186 End stage renal disease: Secondary | ICD-10-CM | POA: Diagnosis not present

## 2021-08-03 DIAGNOSIS — Z79899 Other long term (current) drug therapy: Secondary | ICD-10-CM | POA: Diagnosis not present

## 2021-08-03 DIAGNOSIS — N2581 Secondary hyperparathyroidism of renal origin: Secondary | ICD-10-CM | POA: Diagnosis not present

## 2021-08-04 DIAGNOSIS — Z4932 Encounter for adequacy testing for peritoneal dialysis: Secondary | ICD-10-CM | POA: Diagnosis not present

## 2021-08-04 DIAGNOSIS — Z992 Dependence on renal dialysis: Secondary | ICD-10-CM | POA: Diagnosis not present

## 2021-08-04 DIAGNOSIS — R82998 Other abnormal findings in urine: Secondary | ICD-10-CM | POA: Diagnosis not present

## 2021-08-04 DIAGNOSIS — Z79899 Other long term (current) drug therapy: Secondary | ICD-10-CM | POA: Diagnosis not present

## 2021-08-04 DIAGNOSIS — N186 End stage renal disease: Secondary | ICD-10-CM | POA: Diagnosis not present

## 2021-08-04 DIAGNOSIS — D509 Iron deficiency anemia, unspecified: Secondary | ICD-10-CM | POA: Diagnosis not present

## 2021-08-04 DIAGNOSIS — N2581 Secondary hyperparathyroidism of renal origin: Secondary | ICD-10-CM | POA: Diagnosis not present

## 2021-08-05 ENCOUNTER — Ambulatory Visit: Payer: Medicare Other | Admitting: Orthopedic Surgery

## 2021-08-05 DIAGNOSIS — Z992 Dependence on renal dialysis: Secondary | ICD-10-CM | POA: Diagnosis not present

## 2021-08-05 DIAGNOSIS — N186 End stage renal disease: Secondary | ICD-10-CM | POA: Diagnosis not present

## 2021-08-05 DIAGNOSIS — Z79899 Other long term (current) drug therapy: Secondary | ICD-10-CM | POA: Diagnosis not present

## 2021-08-05 DIAGNOSIS — D509 Iron deficiency anemia, unspecified: Secondary | ICD-10-CM | POA: Diagnosis not present

## 2021-08-05 DIAGNOSIS — N2581 Secondary hyperparathyroidism of renal origin: Secondary | ICD-10-CM | POA: Diagnosis not present

## 2021-08-05 DIAGNOSIS — R82998 Other abnormal findings in urine: Secondary | ICD-10-CM | POA: Diagnosis not present

## 2021-08-05 DIAGNOSIS — Z4932 Encounter for adequacy testing for peritoneal dialysis: Secondary | ICD-10-CM | POA: Diagnosis not present

## 2021-08-06 ENCOUNTER — Ambulatory Visit: Payer: Self-pay

## 2021-08-06 ENCOUNTER — Encounter: Payer: Self-pay | Admitting: Family

## 2021-08-06 ENCOUNTER — Ambulatory Visit (INDEPENDENT_AMBULATORY_CARE_PROVIDER_SITE_OTHER): Payer: Medicare Other | Admitting: Family

## 2021-08-06 DIAGNOSIS — N186 End stage renal disease: Secondary | ICD-10-CM | POA: Diagnosis not present

## 2021-08-06 DIAGNOSIS — Z992 Dependence on renal dialysis: Secondary | ICD-10-CM | POA: Diagnosis not present

## 2021-08-06 DIAGNOSIS — M25571 Pain in right ankle and joints of right foot: Secondary | ICD-10-CM | POA: Diagnosis not present

## 2021-08-06 DIAGNOSIS — N2581 Secondary hyperparathyroidism of renal origin: Secondary | ICD-10-CM | POA: Diagnosis not present

## 2021-08-06 DIAGNOSIS — Z4932 Encounter for adequacy testing for peritoneal dialysis: Secondary | ICD-10-CM | POA: Diagnosis not present

## 2021-08-06 DIAGNOSIS — R82998 Other abnormal findings in urine: Secondary | ICD-10-CM | POA: Diagnosis not present

## 2021-08-06 DIAGNOSIS — D509 Iron deficiency anemia, unspecified: Secondary | ICD-10-CM | POA: Diagnosis not present

## 2021-08-06 DIAGNOSIS — Z79899 Other long term (current) drug therapy: Secondary | ICD-10-CM | POA: Diagnosis not present

## 2021-08-06 DIAGNOSIS — C642 Malignant neoplasm of left kidney, except renal pelvis: Secondary | ICD-10-CM | POA: Diagnosis not present

## 2021-08-06 MED ORDER — PREDNISONE 10 MG PO TABS
10.0000 mg | ORAL_TABLET | Freq: Every day | ORAL | 0 refills | Status: DC
Start: 2021-08-06 — End: 2021-10-10

## 2021-08-06 NOTE — Progress Notes (Signed)
Office Visit Note   Patient: Albert Eaton           Date of Birth: [birth year]           MRN: [id number] Visit Date: 08/06/2021              Requested by: [name], FNP [address],  [place] [address] PCP: [name], FNP  No chief complaint on file.     HPI: The patient is a 55 year old gentleman who presents today for initial evaluation of right ankle pain.  This is been ongoing for about a month.  Gradually worsening.  He is having shooting pain over the anterior lateral ankle joint and this shoots into his ankle this is brought on by standing on uneven terrain specifically in his home with he is standing on a rug or a mat if the foot is not entirely the same terrain he has pain.  This is independent of shoe wear.  Assessment & Plan: Visit Diagnoses:  1. Pain in right ankle and joints of right foot     Plan: Discussed the importance of orthotics for arch support.  Offered Depo-Medrol injection of the ankle. Will consider MRI of right ankle if no relief with orthotics or injection  Follow-Up Instructions: Return in about 4 weeks (around 09/03/2021), or if symptoms worsen or fail to improve.   Right Ankle Exam   Tenderness  Right ankle tenderness location: sinus tarsi, anterior joint line. Swelling: none  Range of Motion  The patient has normal right ankle ROM.  Tests  Varus tilt: positive   Comments:  Pes planus     Patient is alert, oriented, no adenopathy, well-dressed, normal affect, normal respiratory effort.   Imaging: No results found. No images are attached to the encounter.  Labs: Lab Results  Component Value Date   HGBA1C 5.3 05/01/2020   HGBA1C 5.3 05/01/2020   HGBA1C 5.3 (A) 05/01/2020   HGBA1C 5.3 05/01/2020   ESRSEDRATE 68 (H) 05/24/2020   ESRSEDRATE 60 (H) 01/01/2017   CRP 4 05/24/2020   CRP 112.4 (H) 01/01/2017   LABURIC 8.1 (H) 03/30/2017   LABURIC 7.1 01/27/2017   LABURIC 8.7 (H) 01/01/2017    REPTSTATUS 06/22/2021 FINAL 06/17/2021   REPTSTATUS 06/18/2021 FINAL 06/17/2021   GRAMSTAIN  06/17/2021    WBC PRESENT,BOTH PMN AND MONONUCLEAR NO ORGANISMS SEEN CYTOSPIN SMEAR Performed at [facility], [place] [address] [place], [place] [address]    CULT  06/17/2021    NO GROWTH 5 DAYS Performed at [facility] [address] [place], [place] [address]    LABORGA KLEBSIELLA PNEUMONIAE (A) 05/31/2017     Lab Results  Component Value Date   ALBUMIN 3.7 07/16/2021   ALBUMIN 4.1 06/17/2021   ALBUMIN 5.3 (H) 05/01/2021    Lab Results  Component Value Date   MG 1.8 08/30/2020   MG 2.3 06/13/2018   MG 1.6 (L) 06/08/2017   Lab Results  Component Value Date   VD25OH 14.9 (L) 07/08/2018    No results found for: PREALBUMIN CBC EXTENDED Latest Ref Rng & Units 07/16/2021 06/27/2021 06/17/2021  WBC 4.0 - 10.5 K/uL 6.1 3.9 8.4  RBC 4.22 - 5.81 MIL/uL 3.15(L) 3.49(L) 3.06(L)  HGB 13.0 - 17.0 g/dL 10.4(L) 11.1(L) 10.0(L)  HCT 39.0 - 52.0 % 29.2(L) 32.4(L) 29.2(L)  PLT 150 - 400 K/uL 277 315 203  NEUTROABS 1.7 - 7.7 K/uL 3.2 1,685 5.8  LYMPHSABS 0.7 -  4.0 K/uL 1.9 1,615 1.5     There is no height or weight on file to calculate BMI.  Orders:  Orders Placed This Encounter  Procedures   XR Ankle Complete Right   No orders of the defined types were placed in this encounter.    Procedures: No procedures performed  Clinical Data: No additional findings.  ROS:  All other systems negative, except as noted in the HPI. Review of Systems  Constitutional:  Negative for chills and fever.  Musculoskeletal:  Positive for arthralgias and joint swelling.   Objective: Vital Signs: There were no vitals taken for this visit.  Specialty Comments:  No specialty comments available.  PMFS History: Patient Active Problem List   Diagnosis Date Noted   Renal cell carcinoma ([place]) 06/27/2021   SVT (supraventricular tachycardia) ([place]) 01/18/2021   Lower abdominal pain  06/26/2020   Pulsatile tinnitus of right ear 11/21/2019   Kidney failure 88/91/6945   Acute diastolic CHF (congestive heart failure) ([place]) 11/21/2019   Chills (without fever) 11/07/2019   Hypercalcemia 10/17/2019   Dependence on renal dialysis ([place]) 06/06/2019   Coagulation defect, unspecified ([place]) 05/23/2019   Iron deficiency anemia, unspecified 05/16/2019   Anemia in other chronic diseases classified elsewhere 05/13/2019   Arteriovenous fistula, acquired ([place]) 05/13/2019   Body mass index (BMI) 28.0-28.9, adult 05/13/2019   Crohn's disease of small intestine without complications ([place]) 03/88/8280   Encounter for screening for respiratory tuberculosis 05/13/2019   Gout, unspecified 05/13/2019   Nausea 05/13/2019   Other fatigue 05/13/2019   Secondary hyperparathyroidism of renal origin ([place]) 05/13/2019   End stage renal disease ([place]) 05/13/2019   Chronic diastolic (congestive) heart failure ([place]) 05/13/2019   Chronic kidney disease, unspecified 05/13/2019   Obstructive sleep apnea 05/13/2019   Discomfort of right ear 02/18/2019   Hypokalemia 06/13/2018   Abnormal CT scan, small bowel    Perennial allergic rhinitis with a predominant nonallergic component 02/09/2018   Allergic conjunctivitis 02/09/2018   Mild intermittent asthma 02/09/2018   Atherosclerosis 12/09/2017   Hypothyroidism 11/19/2017   Mixed hyperlipidemia 10/13/2017   Personal history of gout 10/13/2017   S/P cholecystectomy 10/13/2017   Congestive heart failure with LV diastolic dysfunction, NYHA class 1 ([place]) 10/13/2017   Hypothyroidism (acquired) 10/13/2017   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   SOB (shortness of breath) 06/07/2017   Arthritis, gouty 01/27/2017   Chronic diastolic CHF (congestive heart failure) ([place]) 01/06/2017   OSA (obstructive sleep apnea) 01/06/2017   Abnormal electrocardiogram (ECG) (EKG) 10/31/2016   Chest pain 10/09/2016   HIV disease ([place]) 10/09/2016   Hypertensive heart disease  with CHF (congestive heart failure) ([place]) 10/09/2016   Left-sided low back pain without sciatica 05/29/2015   NYHA class 2 heart failure with preserved ejection fraction ([place]) 05/15/2015   PAH (pulmonary artery hypertension) ([place]) 05/08/2015   Severe mitral regurgitation 05/04/2015   Prolonged Q-T interval on ECG 05/04/2015   Past Medical History:  Diagnosis Date   Anemia    Aortic atherosclerosis (HCC)    Arthritis    Asthma    Cancer ([place])    renal cyst   Chronic diastolic CHF (congestive heart failure) ([place]) 01/06/2017   Echo 7/16 [facility] in [place], [name]) Mild AI, mild LAE, mild concentric LVH, EF 55, normal wall motion, mild to moderate MR, mild PI, RVSP 55 // Echo 10/09/16 ([facility]):  Moderate LVH, grade 2 diastolic dysfunction, mild MR, moderate LAE    CKD (chronic kidney disease)    Dialysis Mon  Wed Fri   Esophagitis    GERD (gastroesophageal reflux disease)    Gout    no current problems   Heart murmur    never caused any problems   Hepatitis    Hep B   History of nuclear stress test    a. Nuc study 7/16: no scar or ischemia, EF 42 // b. Nuc study 12/17: EF 48, ?small apical ischemia, Low Risk   HIV (human immunodeficiency virus infection) ([place])    HLD (hyperlipidemia)    Hypertension    Hypertensive heart disease with CHF (congestive heart failure) ([place]) 10/09/2016   Hypothyroidism    Mitral regurgitation    OSA (obstructive sleep apnea)    Post-operative nausea and vomiting    1 time as a child, no problems as an adult   Sleep apnea    uses c-pap   Thyroid disease    Wears glasses    Wears partial dentures     Family History  Problem Relation Age of Onset   Heart failure Mother    Heart attack Maternal Grandmother 16   Hypertension Sister    Multiple sclerosis Sister    Hypertension Brother    Hypertension Sister    Scoliosis Other    Allergic rhinitis Neg Hx    Angioedema Neg Hx    Asthma Neg Hx    Eczema Neg Hx    Immunodeficiency Neg Hx     Urticaria Neg Hx    Colon cancer Neg Hx    Pancreatic cancer Neg Hx    Esophageal cancer Neg Hx     Past Surgical History:  Procedure Laterality Date   AV FISTULA PLACEMENT Left 07/02/2018   Procedure: Creation of Left arm BRACHIOBASILIC ARTERIOVENOUS  FISTULA;  Surgeon: Marty Heck, MD;  Location: [facility];  Service: Vascular;  Laterality: Left;   [place] Left 08/27/2018   Procedure: Left arm BRACHIOBASILIC VEIN TRANSPOSITION SECOND STAGE;  Surgeon: Marty Heck, MD;  Location: [facility];  Service: Vascular;  Laterality: Left;   BUBBLE STUDY  09/03/2020   Procedure: BUBBLE STUDY;  Surgeon: Fay Records, MD;  Location: [facility];  Service: Cardiovascular;;   CAPD INSERTION N/A 05/30/2021   Procedure: LAPAROSCOPIC INSERTION CONTINUOUS AMBULATORY PERITONEAL DIALYSIS  (CAPD) CATHETER;  Surgeon: [name], MD;  Location[facility];  Service: Vascular;  Laterality: N/A;   CHOLECYSTECTOMY     COLONOSCOPY W/ BIOPSIES AND POLYPECTOMY     GIVENS CAPSULE STUDY N/A 03/01/2018   Procedure: GIVENS CAPSULE STUDY;  Surgeon: Jerene Bears, MD;  Location: [facility];  Service: Gastroenterology;  Laterality: N/A;   HERNIA REPAIR     As baby   MULTIPLE TOOTH EXTRACTIONS     NEPHRECTOMY Left 02/18/2021   NOSE SURGERY     RENAL BIOPSY     TEE WITHOUT CARDIOVERSION N/A 09/03/2020   Procedure: TRANSESOPHAGEAL ECHOCARDIOGRAM (TEE);  Surgeon: Fay Records, MD;  [facility]COPY;  Service: Cardiovascular;  Laterality: N/A;   UPPER GI ENDOSCOPY  04/05/2021   Social History   Occupational History   Not on file  Tobacco Use   Smoking status: Former    Packs/day: 1.00    Types: Cigarettes    Quit date: 2000    Years since quitting: 22.7   Smokeless tobacco: Never  Vaping Use   Vaping Use: Never used  Substance and Sexual Activity   Alcohol use: Yes    Comment: occasionally liquor   Drug use: No  Sexual activity: Not Currently    Partners: Male    Birth  control/protection: Condom    Comment: declined  condoms 07/11/20

## 2021-08-07 ENCOUNTER — Other Ambulatory Visit: Payer: Self-pay | Admitting: Allergy & Immunology

## 2021-08-07 ENCOUNTER — Other Ambulatory Visit: Payer: Self-pay | Admitting: Physician Assistant

## 2021-08-07 DIAGNOSIS — M5386 Other specified dorsopathies, lumbar region: Secondary | ICD-10-CM | POA: Diagnosis not present

## 2021-08-07 DIAGNOSIS — Z79899 Other long term (current) drug therapy: Secondary | ICD-10-CM | POA: Diagnosis not present

## 2021-08-07 DIAGNOSIS — Z992 Dependence on renal dialysis: Secondary | ICD-10-CM | POA: Diagnosis not present

## 2021-08-07 DIAGNOSIS — Z4932 Encounter for adequacy testing for peritoneal dialysis: Secondary | ICD-10-CM | POA: Diagnosis not present

## 2021-08-07 DIAGNOSIS — N186 End stage renal disease: Secondary | ICD-10-CM | POA: Diagnosis not present

## 2021-08-07 DIAGNOSIS — N2581 Secondary hyperparathyroidism of renal origin: Secondary | ICD-10-CM | POA: Diagnosis not present

## 2021-08-07 DIAGNOSIS — D509 Iron deficiency anemia, unspecified: Secondary | ICD-10-CM | POA: Diagnosis not present

## 2021-08-07 DIAGNOSIS — M9905 Segmental and somatic dysfunction of pelvic region: Secondary | ICD-10-CM | POA: Diagnosis not present

## 2021-08-07 DIAGNOSIS — R82998 Other abnormal findings in urine: Secondary | ICD-10-CM | POA: Diagnosis not present

## 2021-08-07 DIAGNOSIS — M9904 Segmental and somatic dysfunction of sacral region: Secondary | ICD-10-CM | POA: Diagnosis not present

## 2021-08-07 DIAGNOSIS — M9903 Segmental and somatic dysfunction of lumbar region: Secondary | ICD-10-CM | POA: Diagnosis not present

## 2021-08-08 ENCOUNTER — Ambulatory Visit (INDEPENDENT_AMBULATORY_CARE_PROVIDER_SITE_OTHER): Payer: Medicare Other | Admitting: Allergy & Immunology

## 2021-08-08 ENCOUNTER — Other Ambulatory Visit: Payer: Self-pay

## 2021-08-08 VITALS — BP 136/84 | HR 89 | Temp 97.9°F | Resp 16 | Ht 70.0 in | Wt 201.2 lb

## 2021-08-08 DIAGNOSIS — J4530 Mild persistent asthma, uncomplicated: Secondary | ICD-10-CM

## 2021-08-08 DIAGNOSIS — R82998 Other abnormal findings in urine: Secondary | ICD-10-CM | POA: Diagnosis not present

## 2021-08-08 DIAGNOSIS — N2581 Secondary hyperparathyroidism of renal origin: Secondary | ICD-10-CM | POA: Diagnosis not present

## 2021-08-08 DIAGNOSIS — Z4932 Encounter for adequacy testing for peritoneal dialysis: Secondary | ICD-10-CM | POA: Diagnosis not present

## 2021-08-08 DIAGNOSIS — N186 End stage renal disease: Secondary | ICD-10-CM | POA: Diagnosis not present

## 2021-08-08 DIAGNOSIS — D509 Iron deficiency anemia, unspecified: Secondary | ICD-10-CM | POA: Diagnosis not present

## 2021-08-08 DIAGNOSIS — Z992 Dependence on renal dialysis: Secondary | ICD-10-CM | POA: Diagnosis not present

## 2021-08-08 DIAGNOSIS — R432 Parageusia: Secondary | ICD-10-CM | POA: Diagnosis not present

## 2021-08-08 DIAGNOSIS — Z79899 Other long term (current) drug therapy: Secondary | ICD-10-CM | POA: Diagnosis not present

## 2021-08-08 DIAGNOSIS — J3089 Other allergic rhinitis: Secondary | ICD-10-CM | POA: Diagnosis not present

## 2021-08-08 NOTE — Progress Notes (Signed)
FOLLOW UP  Date of Service/Encounter:  08/08/21   Assessment:   Mild persistent asthma, uncomplicated   Perennial allergic rhinitis (dust mites)   HIV - on HAART   ESRD - on transplant list   Dysgeusia - unknown cause  Plan/Recommendations:   1. Mild persistent asthma, uncomplicated - Lung testing looked awesome today. - I think with your recent increase in your albuterol use, we should do Flovent two puffs ONCE daily (morning or night, whenever your symptoms are worse) - Daily controller medication(s): Flovent two puffs once daily with spacer - Prior to physical activity: albuterol 2 puffs 10-15 minutes before physical activity. - Rescue medications: albuterol 4 puffs every 4-6 hours as needed - Changes during respiratory infections or worsening symptoms: Increase Flovent 164mg to 2 puffs twice daily for TWO WEEKS. - Asthma control goals:  * Full participation in all desired activities (may need albuterol before activity) * Albuterol use two time or less a week on average (not counting use with activity) * Cough interfering with sleep two time or less a month * Oral steroids no more than once a year * No hospitalizations  2. Perennial allergic conjunctivitis of both eyes - Continue with Pataday one drop per eye twice daily. - Continue with Systane eye drops as needed. - Continue with the nose sprays as needed.   3. Odd taste in mouth - We are going to get a hemoglobin A1c to look for evidence of diabetes (I think this will be normal but we will see). - We will call you in 1-2 weeks with the results of the testing. - I am going to talk to the other docs to see if there is something else we should do.   4. Return in about 4 weeks (around 09/05/2021).    Subjective:   Albert Eaton is a 55y.o. male presenting today for follow up of  Chief Complaint  Patient presents with   Other    Taste issue - changes from sweet to ammonia     [place] has a  history of the following: Patient Active Problem List   Diagnosis Date Noted   Renal cell carcinoma ([facility] [place] 06/27/2021   SVT (supraventricular tachycardia) ([facility] [place] 01/18/2021   Lower abdominal pain 06/26/2020   Pulsatile tinnitus of right ear 11/21/2019   Kidney failure 067/89/3810  Acute diastolic CHF (congestive heart failure) ([facility] 11/21/2019   Chills (without fever) 11/07/2019   Hypercalcemia 10/17/2019   Dependence on renal dialysis ([place] 06/06/2019   Coagulation defect, unspecified ([facility] 05/23/2019   Iron deficiency anemia, unspecified 05/16/2019   Anemia in other chronic diseases classified elsewhere 05/13/2019   Arteriovenous fistula, acquired ([facility] 05/13/2019   Body mass index (BMI) 28.0-28.9, adult 05/13/2019   Crohn's disease of small intestine without complications ([facility] 017/51/0258  Encounter for screening for respiratory tuberculosis 05/13/2019   Gout, unspecified 05/13/2019   Nausea 05/13/2019   Other fatigue 05/13/2019   Secondary hyperparathyroidism of renal origin ([facility] 05/13/2019   End stage renal disease ([facility] 05/13/2019   Chronic diastolic (congestive) heart failure ([facility] 05/13/2019   Chronic kidney disease, unspecified 05/13/2019   Obstructive sleep apnea 05/13/2019   Discomfort of right ear 02/18/2019   Hypokalemia 06/13/2018   Abnormal CT scan, small bowel    Perennial allergic rhinitis with a predominant nonallergic component 02/09/2018   Allergic conjunctivitis 02/09/2018   Mild intermittent asthma 02/09/2018   Atherosclerosis 12/09/2017   Hypothyroidism 11/19/2017   Mixed hyperlipidemia 10/13/2017  Personal history of gout 10/13/2017   S/P cholecystectomy 10/13/2017   Congestive heart failure with LV diastolic dysfunction, NYHA class 1 ([place]) 10/13/2017   Hypothyroidism (acquired) 10/13/2017   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   SOB (shortness of breath) 06/07/2017   Arthritis, gouty 01/27/2017   Chronic diastolic CHF (congestive heart failure)  ([place]) 01/06/2017   OSA (obstructive sleep apnea) 01/06/2017   Abnormal electrocardiogram (ECG) (EKG) 10/31/2016   Chest pain 10/09/2016   HIV disease ([place]) 10/09/2016   Hypertensive heart disease with CHF (congestive heart failure) ([place]) 10/09/2016   Left-sided low back pain without sciatica 05/29/2015   NYHA class 2 heart failure with preserved ejection fraction ([place]) 05/15/2015   PAH (pulmonary artery hypertension) ([place]) 05/08/2015   Severe mitral regurgitation 05/04/2015   Prolonged Q-T interval on ECG 05/04/2015    History obtained from: chart review and patient.  Albert Eaton is a 55 y.o. male presenting for a follow up visit. He was last seen in February 2022. At that time, lung testing was not done. He was continued on albuterol as needed. He has Floven that he adds during respiratory flares. For his rhinitis, we continued with Pataday as well as Systane and nose sprays as needed.   Since the last visit, he has mostly done well.   Asthma/Respiratory Symptom History: He has been using his rescue inhaler much more often. This is not normal for him. He has been using his rescue inhaler once weekly for a month or two. He has Flovent that he uses during respiratory distress periods.    Allergic Rhinitis Symptom History: Ocular symptoms are under fair control. He has been using the Pataday occasionally. This is more when he has allergy symptoms. He reports some eye watering. He uses the drops when his eyes start watering.   On a different note, he reports that he has a changing taste in his mouth. He tells me that this is "sweet all of the time". He is unsure what is going on. It does taste like ammonia sometimes as well.   This has been going on for a while. He talked to his nephrologist, but at the time he was having an ammonia taste in his mouth. This is a recent thing with the sweet taste. He went t othe dentist three weeks ago. This was not revealing. Nephrologist did not have any idea what  was going on, either. He has no history of passing out or seizures at all.   Otherwise, there have been no changes to his past medical history, surgical history, family history, or social history.    Review of Systems  Constitutional: Negative.  Negative for chills, fever, malaise/fatigue and weight loss.  HENT:  Positive for congestion. Negative for ear discharge, ear pain and sinus pain.   Eyes:  Negative for pain, discharge and redness.  Respiratory:  Negative for cough, sputum production, shortness of breath and wheezing.   Cardiovascular: Negative.  Negative for chest pain and palpitations.  Gastrointestinal:  Negative for abdominal pain, constipation, diarrhea, heartburn, nausea and vomiting.  Skin: Negative.  Negative for itching and rash.  Neurological:  Negative for dizziness and headaches.  Endo/Heme/Allergies:  Positive for environmental allergies. Does not bruise/bleed easily.      Objective:   Blood pressure 136/84, pulse 89, temperature 97.9 F (36.6 C), resp. rate 16, height 5' 10"  (1.778 m), weight 201 lb 3.2 oz (91.3 kg), SpO2 94 %. Body mass index is 28.87 kg/m.   Physical Exam:  Physical  Exam Vitals reviewed.  Constitutional:      Appearance: He is well-developed.  HENT:     Head: Normocephalic and atraumatic.     Right Ear: Tympanic membrane, ear canal and external ear normal.     Left Ear: Tympanic membrane, ear canal and external ear normal.     Nose: No nasal deformity, septal deviation, mucosal edema or rhinorrhea.     Right Turbinates: Enlarged and swollen.     Left Turbinates: Enlarged and swollen.     Right Sinus: No maxillary sinus tenderness or frontal sinus tenderness.     Left Sinus: No maxillary sinus tenderness or frontal sinus tenderness.     Mouth/Throat:     Mouth: Mucous membranes are not pale and not dry.     Pharynx: Uvula midline.     Comments: Tongue appears normal. No fissuring. Eyes:     General: Lids are normal. No allergic  shiner.       Right eye: No discharge.        Left eye: No discharge.     Conjunctiva/sclera: Conjunctivae normal.     Right eye: Right conjunctiva is not injected. No chemosis.    Left eye: Left conjunctiva is not injected. No chemosis.    Pupils: Pupils are equal, round, and reactive to light.  Cardiovascular:     Rate and Rhythm: Normal rate and regular rhythm.     Heart sounds: Normal heart sounds.  Pulmonary:     Effort: Pulmonary effort is normal. No tachypnea, accessory muscle usage or respiratory distress.     Breath sounds: Normal breath sounds. No wheezing, rhonchi or rales.     Comments: Moving air well in all lung fields. No increased work of breathing noted.  Chest:     Chest wall: No tenderness.  Lymphadenopathy:     Cervical: No cervical adenopathy.  Skin:    General: Skin is warm.     Capillary Refill: Capillary refill takes less than 2 seconds.     Coloration: Skin is not pale.     Findings: No abrasion, erythema, petechiae or rash. Rash is not papular, urticarial or vesicular.  Neurological:     Mental Status: He is alert.  Psychiatric:        Behavior: Behavior is cooperative.     Diagnostic studies:   Spirometry: results normal (FEV1: 3.02/93%, FVC: 3.80/92%, FEV1/FVC: 79%).    Spirometry consistent with normal pattern.   Allergy Studies: none        Salvatore Marvel, MD  [facility]

## 2021-08-08 NOTE — Patient Instructions (Addendum)
1. Mild persistent asthma, uncomplicated - Lung testing looked awesome today. - I think with your recent increase in your albuterol use, we should do Flovent two puffs ONCE daily (morning or night, whenever your symptoms are worse) - Daily controller medication(s): Flovent two puffs once daily with spacer - Prior to physical activity: albuterol 2 puffs 10-15 minutes before physical activity. - Rescue medications: albuterol 4 puffs every 4-6 hours as needed - Changes during respiratory infections or worsening symptoms: Increase Flovent 137mg to 2 puffs twice daily for TWO WEEKS. - Asthma control goals:  * Full participation in all desired activities (may need albuterol before activity) * Albuterol use two time or less a week on average (not counting use with activity) * Cough interfering with sleep two time or less a month * Oral steroids no more than once a year * No hospitalizations  2. Perennial allergic conjunctivitis of both eyes - Continue with Pataday one drop per eye twice daily. - Continue with Systane eye drops as needed. - Continue with the nose sprays as needed.   3. Odd taste in mouth - We are going to get a hemoglobin A1c to look for evidence of diabetes (I think this will be normal but we will see). - We will call you in 1-2 weeks with the results of the testing. - I am going to talk to the other docs to see if there is something else we should do.   4. Return in about 4 weeks (around 09/05/2021).    Please inform [place]of any Emergency Department visits, hospitalizations, or changes in symptoms. Call [place]efore going to the ED for breathing or allergy symptoms since we might be able to fit you in for a sick visit. Feel free to contact u[place]anytime with any questions, problems, or concerns.  It was a pleasure to see you again today!  Websites that have reliable patient information: 1. American Academy of Asthma, Allergy, and Immunology: www.aaaai.org 2. Food Allergy Research  and Education (FARE): foodallergy.org 3. Mothers of Asthmatics: http://www.asthmacommunitynetwork.org 4. American College of Allergy, Asthma, and Immunology: www.acaai.org   COVID-19 Vaccine Information can be found at: [email or web address] questions related to vaccine distribution or appointments, please email [email or web address] or call [phone]     "Like" [facility] Facebook and Instagram for our latest updates!       Make sure you are registered to vote! If you have moved or changed any of your contact information, you will need to get this updated before voting!  In some cases, you MAY be able to register to vote online: [email or web address]

## 2021-08-09 DIAGNOSIS — Z79899 Other long term (current) drug therapy: Secondary | ICD-10-CM | POA: Diagnosis not present

## 2021-08-09 DIAGNOSIS — N2581 Secondary hyperparathyroidism of renal origin: Secondary | ICD-10-CM | POA: Diagnosis not present

## 2021-08-09 DIAGNOSIS — R82998 Other abnormal findings in urine: Secondary | ICD-10-CM | POA: Diagnosis not present

## 2021-08-09 DIAGNOSIS — N186 End stage renal disease: Secondary | ICD-10-CM | POA: Diagnosis not present

## 2021-08-09 DIAGNOSIS — D509 Iron deficiency anemia, unspecified: Secondary | ICD-10-CM | POA: Diagnosis not present

## 2021-08-09 DIAGNOSIS — Z4932 Encounter for adequacy testing for peritoneal dialysis: Secondary | ICD-10-CM | POA: Diagnosis not present

## 2021-08-09 DIAGNOSIS — Z992 Dependence on renal dialysis: Secondary | ICD-10-CM | POA: Diagnosis not present

## 2021-08-10 DIAGNOSIS — R82998 Other abnormal findings in urine: Secondary | ICD-10-CM | POA: Diagnosis not present

## 2021-08-10 DIAGNOSIS — N186 End stage renal disease: Secondary | ICD-10-CM | POA: Diagnosis not present

## 2021-08-10 DIAGNOSIS — N2581 Secondary hyperparathyroidism of renal origin: Secondary | ICD-10-CM | POA: Diagnosis not present

## 2021-08-10 DIAGNOSIS — Z992 Dependence on renal dialysis: Secondary | ICD-10-CM | POA: Diagnosis not present

## 2021-08-10 DIAGNOSIS — Z79899 Other long term (current) drug therapy: Secondary | ICD-10-CM | POA: Diagnosis not present

## 2021-08-10 DIAGNOSIS — Z4932 Encounter for adequacy testing for peritoneal dialysis: Secondary | ICD-10-CM | POA: Diagnosis not present

## 2021-08-10 DIAGNOSIS — D509 Iron deficiency anemia, unspecified: Secondary | ICD-10-CM | POA: Diagnosis not present

## 2021-08-11 DIAGNOSIS — D509 Iron deficiency anemia, unspecified: Secondary | ICD-10-CM | POA: Diagnosis not present

## 2021-08-11 DIAGNOSIS — Z992 Dependence on renal dialysis: Secondary | ICD-10-CM | POA: Diagnosis not present

## 2021-08-11 DIAGNOSIS — N2581 Secondary hyperparathyroidism of renal origin: Secondary | ICD-10-CM | POA: Diagnosis not present

## 2021-08-11 DIAGNOSIS — N186 End stage renal disease: Secondary | ICD-10-CM | POA: Diagnosis not present

## 2021-08-11 DIAGNOSIS — Z4932 Encounter for adequacy testing for peritoneal dialysis: Secondary | ICD-10-CM | POA: Diagnosis not present

## 2021-08-11 DIAGNOSIS — Z79899 Other long term (current) drug therapy: Secondary | ICD-10-CM | POA: Diagnosis not present

## 2021-08-11 DIAGNOSIS — R82998 Other abnormal findings in urine: Secondary | ICD-10-CM | POA: Diagnosis not present

## 2021-08-12 ENCOUNTER — Ambulatory Visit (INDEPENDENT_AMBULATORY_CARE_PROVIDER_SITE_OTHER): Payer: Medicare Other | Admitting: Pulmonary Disease

## 2021-08-12 ENCOUNTER — Encounter: Payer: Self-pay | Admitting: Allergy & Immunology

## 2021-08-12 ENCOUNTER — Encounter: Payer: Self-pay | Admitting: Pulmonary Disease

## 2021-08-12 ENCOUNTER — Other Ambulatory Visit: Payer: Self-pay | Admitting: Family Medicine

## 2021-08-12 ENCOUNTER — Other Ambulatory Visit: Payer: Self-pay

## 2021-08-12 VITALS — BP 126/80 | HR 71 | Temp 98.4°F | Ht 70.0 in | Wt 209.0 lb

## 2021-08-12 DIAGNOSIS — N2581 Secondary hyperparathyroidism of renal origin: Secondary | ICD-10-CM | POA: Diagnosis not present

## 2021-08-12 DIAGNOSIS — R82998 Other abnormal findings in urine: Secondary | ICD-10-CM | POA: Diagnosis not present

## 2021-08-12 DIAGNOSIS — G4733 Obstructive sleep apnea (adult) (pediatric): Secondary | ICD-10-CM

## 2021-08-12 DIAGNOSIS — Z79899 Other long term (current) drug therapy: Secondary | ICD-10-CM | POA: Diagnosis not present

## 2021-08-12 DIAGNOSIS — Z4932 Encounter for adequacy testing for peritoneal dialysis: Secondary | ICD-10-CM | POA: Diagnosis not present

## 2021-08-12 DIAGNOSIS — D509 Iron deficiency anemia, unspecified: Secondary | ICD-10-CM | POA: Diagnosis not present

## 2021-08-12 DIAGNOSIS — N186 End stage renal disease: Secondary | ICD-10-CM | POA: Diagnosis not present

## 2021-08-12 DIAGNOSIS — Z992 Dependence on renal dialysis: Secondary | ICD-10-CM | POA: Diagnosis not present

## 2021-08-12 DIAGNOSIS — M109 Gout, unspecified: Secondary | ICD-10-CM

## 2021-08-12 LAB — BETA-HYDROXYBUTYRIC ACID: Beta-Hydroxybutyrate: 0.6 mg/dL

## 2021-08-12 LAB — HEMOGLOBIN A1C
Est. average glucose Bld gHb Est-mCnc: 108 mg/dL
Hgb A1c MFr Bld: 5.4 % (ref 4.8–5.6)

## 2021-08-12 MED ORDER — ROPINIROLE HCL 1 MG PO TABS: 1.0000 mg | ORAL_TABLET | Freq: Every day | ORAL | 3 refills | Status: DC

## 2021-08-12 NOTE — Progress Notes (Signed)
Subjective:    Patient ID: Albert Eaton, male    DOB: 1966/08/30, 55 y.o.   MRN: [id number]  Patient with a history of mild obstructive sleep apnea has been on CPAP  He continues to try to use CPAP Continues to have mask issues  Continues to have insomnia -Klonopin did help a little bit  Still having significant issues with restless legs  He had recently discussed restless legs with nephrologist  He continues to feel that the pressure may be a little bit too much but I did review the download from the machine with him-no significant leaks, mean pressure of 7, 95 percentile pressure of 9.4   He occasionally feels the pressure may be a little bit too much Usually tries to go to bed by about 10 PM, on dialysis days goes to bed between 6 and 7 PM Sometimes takes him up to 2 to 3 hours to fall asleep Wakes up frequently in the middle of the night Final awakening time about 9 AM  Multiple comorbidities History of heart disease, end-stage renal disease  He does have dryness of his mouth during the night, occasional headaches in the morning, memory has been fine  His weight is down about 20 pounds recently  He does get short of breath with activity    Past Medical History:  Diagnosis Date   Anemia    Aortic atherosclerosis (HCC)    Arthritis    Asthma    Cancer ([place])    renal cyst   Chronic diastolic CHF (congestive heart failure) ([place]) 01/06/2017   Echo 7/16 [facility] in [place], [name]) Mild AI, mild LAE, mild concentric LVH, EF 55, normal wall motion, mild to moderate MR, mild PI, RVSP 55 // Echo 10/09/16 ([facility]):  Moderate LVH, grade 2 diastolic dysfunction, mild MR, moderate LAE    CKD (chronic kidney disease)    Dialysis Mon Wed Fri   Esophagitis    GERD (gastroesophageal reflux disease)    Gout    no current problems   Heart murmur    never caused any problems   Hepatitis    Hep B   History of nuclear stress test    a. Nuc study 7/16: no scar or  ischemia, EF 42 // b. Nuc study 12/17: EF 48, ?small apical ischemia, Low Risk   HIV (human immunodeficiency virus infection) ([place])    HLD (hyperlipidemia)    Hypertension    Hypertensive heart disease with CHF (congestive heart failure) ([place]) 10/09/2016   Hypothyroidism    Mitral regurgitation    OSA (obstructive sleep apnea)    Post-operative nausea and vomiting    1 time as a child, no problems as an adult   Sleep apnea    uses c-pap   Thyroid disease    Wears glasses    Wears partial dentures    Family History  Problem Relation Age of Onset   Heart failure Mother    Heart attack Maternal Grandmother 6   Hypertension Sister    Multiple sclerosis Sister    Hypertension Brother    Hypertension Sister    Scoliosis Other    Allergic rhinitis Neg Hx    Angioedema Neg Hx    Asthma Neg Hx    Eczema Neg Hx    Immunodeficiency Neg Hx    Urticaria Neg Hx    Colon cancer Neg Hx    Pancreatic cancer Neg Hx    Esophageal cancer Neg Hx    Social  History   Socioeconomic History   Marital status: Single    Spouse name: Not on file   Number of children: Not on file   Years of education: Not on file   Highest education level: Not on file  Occupational History   Not on file  Tobacco Use   Smoking status: Former    Packs/day: 1.00    Types: Cigarettes    Quit date: 2000    Years since quitting: 22.8   Smokeless tobacco: Never  Vaping Use   Vaping Use: Never used  Substance and Sexual Activity   Alcohol use: Yes    Comment: occasionally liquor   Drug use: No   Sexual activity: Not Currently    Partners: Male    Birth control/protection: Condom    Comment: declined  condoms 07/11/20  Other Topics Concern   Not on file  Social History Narrative   Not on file   Social Determinants of Health   Financial Resource Strain: Not on file  Food Insecurity: No Food Insecurity   Worried About Charity fundraiser in the Last Year: Never true   [place] in the Last Year:  Never true  Transportation Needs: No Transportation Needs   Lack of Transportation (Medical): No   Lack of Transportation (Non-Medical): No  Physical Activity: Not on file  Stress: Not on file  Social Connections: Not on file  Intimate Partner Violence: Not on file     Review of Systems  Constitutional: Negative.   HENT:  Positive for congestion. Negative for sneezing.   Eyes: Negative.   Respiratory:  Positive for apnea.   Cardiovascular: Negative.   Gastrointestinal: Negative.   Genitourinary: Negative.   Psychiatric/Behavioral:  Positive for sleep disturbance.   All other systems reviewed and are negative.     Objective:   Physical Exam Constitutional:      Appearance: Normal appearance.  HENT:     Nose: No congestion.  Eyes:     General:        Right eye: No discharge.        Left eye: No discharge.     Pupils: Pupils are equal, round, and reactive to light.  Cardiovascular:     Pulses: Normal pulses.     Heart sounds: Normal heart sounds. No murmur heard.   No friction rub.  Pulmonary:     Effort: Pulmonary effort is normal. No respiratory distress.     Breath sounds: Normal breath sounds. No stridor. No wheezing or rhonchi.  Musculoskeletal:     Cervical back: No rigidity.  Neurological:     Mental Status: He is alert.  Psychiatric:        Mood and Affect: Mood normal.  Polysomnogram from 12/24/2017 reviewed revealing mild obstructive sleep apnea  Compliance data reviewed showing 83% compliance Machine set at 5-20 Median pressure of 7, 95 percentile pressure of 9.4 No significant air leaks    Assessment & Plan:  .  Mild obstructive sleep apnea -Continues to find it difficult to adjust to CPAP use  Sleep onset and sleep maintenance insomnia -Trial with Klonopin which should help his insomnia and also help his restless legs -Still has significant issues with his restless legs  Restless leg -On Neurontin -On Requip  Plan: Increase dose of Requip to  1 mg from 0.5  Continue Klonopin  We will repeat home sleep study to assess for degree of sleep disordered breathing  Encouraged to continue using CPAP as tolerated  Call with significant concerns

## 2021-08-12 NOTE — Patient Instructions (Signed)
We will repeat your home sleep study to assess whether you still have mild sleep apnea which was present on the last study  Increase dose of Requip to 1 mg from 0.5  Call if you feel dose needs to be increased even further  I will see you in 3 months

## 2021-08-13 DIAGNOSIS — Z4932 Encounter for adequacy testing for peritoneal dialysis: Secondary | ICD-10-CM | POA: Diagnosis not present

## 2021-08-13 DIAGNOSIS — R82998 Other abnormal findings in urine: Secondary | ICD-10-CM | POA: Diagnosis not present

## 2021-08-13 DIAGNOSIS — N186 End stage renal disease: Secondary | ICD-10-CM | POA: Diagnosis not present

## 2021-08-13 DIAGNOSIS — M5386 Other specified dorsopathies, lumbar region: Secondary | ICD-10-CM | POA: Diagnosis not present

## 2021-08-13 DIAGNOSIS — Z992 Dependence on renal dialysis: Secondary | ICD-10-CM | POA: Diagnosis not present

## 2021-08-13 DIAGNOSIS — M9904 Segmental and somatic dysfunction of sacral region: Secondary | ICD-10-CM | POA: Diagnosis not present

## 2021-08-13 DIAGNOSIS — M9905 Segmental and somatic dysfunction of pelvic region: Secondary | ICD-10-CM | POA: Diagnosis not present

## 2021-08-13 DIAGNOSIS — D509 Iron deficiency anemia, unspecified: Secondary | ICD-10-CM | POA: Diagnosis not present

## 2021-08-13 DIAGNOSIS — M9903 Segmental and somatic dysfunction of lumbar region: Secondary | ICD-10-CM | POA: Diagnosis not present

## 2021-08-13 DIAGNOSIS — Z79899 Other long term (current) drug therapy: Secondary | ICD-10-CM | POA: Diagnosis not present

## 2021-08-13 DIAGNOSIS — N2581 Secondary hyperparathyroidism of renal origin: Secondary | ICD-10-CM | POA: Diagnosis not present

## 2021-08-14 DIAGNOSIS — N186 End stage renal disease: Secondary | ICD-10-CM | POA: Diagnosis not present

## 2021-08-14 DIAGNOSIS — Z79899 Other long term (current) drug therapy: Secondary | ICD-10-CM | POA: Diagnosis not present

## 2021-08-14 DIAGNOSIS — D509 Iron deficiency anemia, unspecified: Secondary | ICD-10-CM | POA: Diagnosis not present

## 2021-08-14 DIAGNOSIS — N2581 Secondary hyperparathyroidism of renal origin: Secondary | ICD-10-CM | POA: Diagnosis not present

## 2021-08-14 DIAGNOSIS — Z992 Dependence on renal dialysis: Secondary | ICD-10-CM | POA: Diagnosis not present

## 2021-08-14 DIAGNOSIS — R82998 Other abnormal findings in urine: Secondary | ICD-10-CM | POA: Diagnosis not present

## 2021-08-14 DIAGNOSIS — Z4932 Encounter for adequacy testing for peritoneal dialysis: Secondary | ICD-10-CM | POA: Diagnosis not present

## 2021-08-15 DIAGNOSIS — Z992 Dependence on renal dialysis: Secondary | ICD-10-CM | POA: Diagnosis not present

## 2021-08-15 DIAGNOSIS — D509 Iron deficiency anemia, unspecified: Secondary | ICD-10-CM | POA: Diagnosis not present

## 2021-08-15 DIAGNOSIS — Z4932 Encounter for adequacy testing for peritoneal dialysis: Secondary | ICD-10-CM | POA: Diagnosis not present

## 2021-08-15 DIAGNOSIS — Z79899 Other long term (current) drug therapy: Secondary | ICD-10-CM | POA: Diagnosis not present

## 2021-08-15 DIAGNOSIS — R82998 Other abnormal findings in urine: Secondary | ICD-10-CM | POA: Diagnosis not present

## 2021-08-15 DIAGNOSIS — N186 End stage renal disease: Secondary | ICD-10-CM | POA: Diagnosis not present

## 2021-08-15 DIAGNOSIS — N2581 Secondary hyperparathyroidism of renal origin: Secondary | ICD-10-CM | POA: Diagnosis not present

## 2021-08-16 DIAGNOSIS — Z992 Dependence on renal dialysis: Secondary | ICD-10-CM | POA: Diagnosis not present

## 2021-08-16 DIAGNOSIS — Z4932 Encounter for adequacy testing for peritoneal dialysis: Secondary | ICD-10-CM | POA: Diagnosis not present

## 2021-08-16 DIAGNOSIS — R82998 Other abnormal findings in urine: Secondary | ICD-10-CM | POA: Diagnosis not present

## 2021-08-16 DIAGNOSIS — N2581 Secondary hyperparathyroidism of renal origin: Secondary | ICD-10-CM | POA: Diagnosis not present

## 2021-08-16 DIAGNOSIS — Z79899 Other long term (current) drug therapy: Secondary | ICD-10-CM | POA: Diagnosis not present

## 2021-08-16 DIAGNOSIS — D509 Iron deficiency anemia, unspecified: Secondary | ICD-10-CM | POA: Diagnosis not present

## 2021-08-16 DIAGNOSIS — N186 End stage renal disease: Secondary | ICD-10-CM | POA: Diagnosis not present

## 2021-08-17 DIAGNOSIS — Z79899 Other long term (current) drug therapy: Secondary | ICD-10-CM | POA: Diagnosis not present

## 2021-08-17 DIAGNOSIS — Z992 Dependence on renal dialysis: Secondary | ICD-10-CM | POA: Diagnosis not present

## 2021-08-17 DIAGNOSIS — D509 Iron deficiency anemia, unspecified: Secondary | ICD-10-CM | POA: Diagnosis not present

## 2021-08-17 DIAGNOSIS — N186 End stage renal disease: Secondary | ICD-10-CM | POA: Diagnosis not present

## 2021-08-17 DIAGNOSIS — N2581 Secondary hyperparathyroidism of renal origin: Secondary | ICD-10-CM | POA: Diagnosis not present

## 2021-08-17 DIAGNOSIS — R82998 Other abnormal findings in urine: Secondary | ICD-10-CM | POA: Diagnosis not present

## 2021-08-17 DIAGNOSIS — Z4932 Encounter for adequacy testing for peritoneal dialysis: Secondary | ICD-10-CM | POA: Diagnosis not present

## 2021-08-18 DIAGNOSIS — N186 End stage renal disease: Secondary | ICD-10-CM | POA: Diagnosis not present

## 2021-08-18 DIAGNOSIS — D509 Iron deficiency anemia, unspecified: Secondary | ICD-10-CM | POA: Diagnosis not present

## 2021-08-18 DIAGNOSIS — Z992 Dependence on renal dialysis: Secondary | ICD-10-CM | POA: Diagnosis not present

## 2021-08-18 DIAGNOSIS — N2581 Secondary hyperparathyroidism of renal origin: Secondary | ICD-10-CM | POA: Diagnosis not present

## 2021-08-18 DIAGNOSIS — R82998 Other abnormal findings in urine: Secondary | ICD-10-CM | POA: Diagnosis not present

## 2021-08-18 DIAGNOSIS — Z4932 Encounter for adequacy testing for peritoneal dialysis: Secondary | ICD-10-CM | POA: Diagnosis not present

## 2021-08-18 DIAGNOSIS — Z79899 Other long term (current) drug therapy: Secondary | ICD-10-CM | POA: Diagnosis not present

## 2021-08-19 DIAGNOSIS — Z4932 Encounter for adequacy testing for peritoneal dialysis: Secondary | ICD-10-CM | POA: Diagnosis not present

## 2021-08-19 DIAGNOSIS — Z992 Dependence on renal dialysis: Secondary | ICD-10-CM | POA: Diagnosis not present

## 2021-08-19 DIAGNOSIS — N186 End stage renal disease: Secondary | ICD-10-CM | POA: Diagnosis not present

## 2021-08-19 DIAGNOSIS — Z79899 Other long term (current) drug therapy: Secondary | ICD-10-CM | POA: Diagnosis not present

## 2021-08-19 DIAGNOSIS — D509 Iron deficiency anemia, unspecified: Secondary | ICD-10-CM | POA: Diagnosis not present

## 2021-08-19 DIAGNOSIS — N2581 Secondary hyperparathyroidism of renal origin: Secondary | ICD-10-CM | POA: Diagnosis not present

## 2021-08-19 DIAGNOSIS — R82998 Other abnormal findings in urine: Secondary | ICD-10-CM | POA: Diagnosis not present

## 2021-08-20 DIAGNOSIS — R82998 Other abnormal findings in urine: Secondary | ICD-10-CM | POA: Diagnosis not present

## 2021-08-20 DIAGNOSIS — Z992 Dependence on renal dialysis: Secondary | ICD-10-CM | POA: Diagnosis not present

## 2021-08-20 DIAGNOSIS — Z79899 Other long term (current) drug therapy: Secondary | ICD-10-CM | POA: Diagnosis not present

## 2021-08-20 DIAGNOSIS — M9903 Segmental and somatic dysfunction of lumbar region: Secondary | ICD-10-CM | POA: Diagnosis not present

## 2021-08-20 DIAGNOSIS — N2581 Secondary hyperparathyroidism of renal origin: Secondary | ICD-10-CM | POA: Diagnosis not present

## 2021-08-20 DIAGNOSIS — M9904 Segmental and somatic dysfunction of sacral region: Secondary | ICD-10-CM | POA: Diagnosis not present

## 2021-08-20 DIAGNOSIS — D509 Iron deficiency anemia, unspecified: Secondary | ICD-10-CM | POA: Diagnosis not present

## 2021-08-20 DIAGNOSIS — Z4932 Encounter for adequacy testing for peritoneal dialysis: Secondary | ICD-10-CM | POA: Diagnosis not present

## 2021-08-20 DIAGNOSIS — M9905 Segmental and somatic dysfunction of pelvic region: Secondary | ICD-10-CM | POA: Diagnosis not present

## 2021-08-20 DIAGNOSIS — N186 End stage renal disease: Secondary | ICD-10-CM | POA: Diagnosis not present

## 2021-08-20 DIAGNOSIS — M5386 Other specified dorsopathies, lumbar region: Secondary | ICD-10-CM | POA: Diagnosis not present

## 2021-08-21 ENCOUNTER — Ambulatory Visit (INDEPENDENT_AMBULATORY_CARE_PROVIDER_SITE_OTHER): Payer: Medicare Other | Admitting: Dermatology

## 2021-08-21 ENCOUNTER — Other Ambulatory Visit: Payer: Self-pay

## 2021-08-21 ENCOUNTER — Encounter: Payer: Self-pay | Admitting: Dermatology

## 2021-08-21 DIAGNOSIS — D509 Iron deficiency anemia, unspecified: Secondary | ICD-10-CM | POA: Diagnosis not present

## 2021-08-21 DIAGNOSIS — R82998 Other abnormal findings in urine: Secondary | ICD-10-CM | POA: Diagnosis not present

## 2021-08-21 DIAGNOSIS — D23122 Other benign neoplasm of skin of left lower eyelid, including canthus: Secondary | ICD-10-CM

## 2021-08-21 DIAGNOSIS — Z79899 Other long term (current) drug therapy: Secondary | ICD-10-CM | POA: Diagnosis not present

## 2021-08-21 DIAGNOSIS — Z992 Dependence on renal dialysis: Secondary | ICD-10-CM | POA: Diagnosis not present

## 2021-08-21 DIAGNOSIS — D239 Other benign neoplasm of skin, unspecified: Secondary | ICD-10-CM

## 2021-08-21 DIAGNOSIS — L259 Unspecified contact dermatitis, unspecified cause: Secondary | ICD-10-CM

## 2021-08-21 DIAGNOSIS — D23112 Other benign neoplasm of skin of right lower eyelid, including canthus: Secondary | ICD-10-CM

## 2021-08-21 DIAGNOSIS — L739 Follicular disorder, unspecified: Secondary | ICD-10-CM | POA: Diagnosis not present

## 2021-08-21 DIAGNOSIS — N186 End stage renal disease: Secondary | ICD-10-CM | POA: Diagnosis not present

## 2021-08-21 DIAGNOSIS — N2581 Secondary hyperparathyroidism of renal origin: Secondary | ICD-10-CM | POA: Diagnosis not present

## 2021-08-21 DIAGNOSIS — Z4932 Encounter for adequacy testing for peritoneal dialysis: Secondary | ICD-10-CM | POA: Diagnosis not present

## 2021-08-21 MED ORDER — CLOBETASOL PROPIONATE 0.05 % EX FOAM: Freq: Two times a day (BID) | CUTANEOUS | 2 refills | Status: DC

## 2021-08-22 DIAGNOSIS — Z79899 Other long term (current) drug therapy: Secondary | ICD-10-CM | POA: Diagnosis not present

## 2021-08-22 DIAGNOSIS — R82998 Other abnormal findings in urine: Secondary | ICD-10-CM | POA: Diagnosis not present

## 2021-08-22 DIAGNOSIS — N186 End stage renal disease: Secondary | ICD-10-CM | POA: Diagnosis not present

## 2021-08-22 DIAGNOSIS — N2581 Secondary hyperparathyroidism of renal origin: Secondary | ICD-10-CM | POA: Diagnosis not present

## 2021-08-22 DIAGNOSIS — D509 Iron deficiency anemia, unspecified: Secondary | ICD-10-CM | POA: Diagnosis not present

## 2021-08-22 DIAGNOSIS — Z992 Dependence on renal dialysis: Secondary | ICD-10-CM | POA: Diagnosis not present

## 2021-08-22 DIAGNOSIS — Z4932 Encounter for adequacy testing for peritoneal dialysis: Secondary | ICD-10-CM | POA: Diagnosis not present

## 2021-08-23 DIAGNOSIS — Z79899 Other long term (current) drug therapy: Secondary | ICD-10-CM | POA: Diagnosis not present

## 2021-08-23 DIAGNOSIS — R82998 Other abnormal findings in urine: Secondary | ICD-10-CM | POA: Diagnosis not present

## 2021-08-23 DIAGNOSIS — N2581 Secondary hyperparathyroidism of renal origin: Secondary | ICD-10-CM | POA: Diagnosis not present

## 2021-08-23 DIAGNOSIS — Z4932 Encounter for adequacy testing for peritoneal dialysis: Secondary | ICD-10-CM | POA: Diagnosis not present

## 2021-08-23 DIAGNOSIS — N186 End stage renal disease: Secondary | ICD-10-CM | POA: Diagnosis not present

## 2021-08-23 DIAGNOSIS — D509 Iron deficiency anemia, unspecified: Secondary | ICD-10-CM | POA: Diagnosis not present

## 2021-08-23 DIAGNOSIS — Z992 Dependence on renal dialysis: Secondary | ICD-10-CM | POA: Diagnosis not present

## 2021-08-24 DIAGNOSIS — R82998 Other abnormal findings in urine: Secondary | ICD-10-CM | POA: Diagnosis not present

## 2021-08-24 DIAGNOSIS — Z992 Dependence on renal dialysis: Secondary | ICD-10-CM | POA: Diagnosis not present

## 2021-08-24 DIAGNOSIS — Z79899 Other long term (current) drug therapy: Secondary | ICD-10-CM | POA: Diagnosis not present

## 2021-08-24 DIAGNOSIS — N186 End stage renal disease: Secondary | ICD-10-CM | POA: Diagnosis not present

## 2021-08-24 DIAGNOSIS — Z4932 Encounter for adequacy testing for peritoneal dialysis: Secondary | ICD-10-CM | POA: Diagnosis not present

## 2021-08-24 DIAGNOSIS — N2581 Secondary hyperparathyroidism of renal origin: Secondary | ICD-10-CM | POA: Diagnosis not present

## 2021-08-24 DIAGNOSIS — D509 Iron deficiency anemia, unspecified: Secondary | ICD-10-CM | POA: Diagnosis not present

## 2021-08-25 DIAGNOSIS — N186 End stage renal disease: Secondary | ICD-10-CM | POA: Diagnosis not present

## 2021-08-25 DIAGNOSIS — N2581 Secondary hyperparathyroidism of renal origin: Secondary | ICD-10-CM | POA: Diagnosis not present

## 2021-08-25 DIAGNOSIS — D509 Iron deficiency anemia, unspecified: Secondary | ICD-10-CM | POA: Diagnosis not present

## 2021-08-25 DIAGNOSIS — Z4932 Encounter for adequacy testing for peritoneal dialysis: Secondary | ICD-10-CM | POA: Diagnosis not present

## 2021-08-25 DIAGNOSIS — R82998 Other abnormal findings in urine: Secondary | ICD-10-CM | POA: Diagnosis not present

## 2021-08-25 DIAGNOSIS — Z992 Dependence on renal dialysis: Secondary | ICD-10-CM | POA: Diagnosis not present

## 2021-08-25 DIAGNOSIS — Z79899 Other long term (current) drug therapy: Secondary | ICD-10-CM | POA: Diagnosis not present

## 2021-08-26 DIAGNOSIS — Z992 Dependence on renal dialysis: Secondary | ICD-10-CM | POA: Diagnosis not present

## 2021-08-26 DIAGNOSIS — Z4932 Encounter for adequacy testing for peritoneal dialysis: Secondary | ICD-10-CM | POA: Diagnosis not present

## 2021-08-26 DIAGNOSIS — R82998 Other abnormal findings in urine: Secondary | ICD-10-CM | POA: Diagnosis not present

## 2021-08-26 DIAGNOSIS — D509 Iron deficiency anemia, unspecified: Secondary | ICD-10-CM | POA: Diagnosis not present

## 2021-08-26 DIAGNOSIS — Z79899 Other long term (current) drug therapy: Secondary | ICD-10-CM | POA: Diagnosis not present

## 2021-08-26 DIAGNOSIS — I129 Hypertensive chronic kidney disease with stage 1 through stage 4 chronic kidney disease, or unspecified chronic kidney disease: Secondary | ICD-10-CM | POA: Diagnosis not present

## 2021-08-26 DIAGNOSIS — N186 End stage renal disease: Secondary | ICD-10-CM | POA: Diagnosis not present

## 2021-08-26 DIAGNOSIS — N2581 Secondary hyperparathyroidism of renal origin: Secondary | ICD-10-CM | POA: Diagnosis not present

## 2021-08-27 DIAGNOSIS — M9905 Segmental and somatic dysfunction of pelvic region: Secondary | ICD-10-CM | POA: Diagnosis not present

## 2021-08-27 DIAGNOSIS — Z992 Dependence on renal dialysis: Secondary | ICD-10-CM | POA: Diagnosis not present

## 2021-08-27 DIAGNOSIS — M9904 Segmental and somatic dysfunction of sacral region: Secondary | ICD-10-CM | POA: Diagnosis not present

## 2021-08-27 DIAGNOSIS — D631 Anemia in chronic kidney disease: Secondary | ICD-10-CM | POA: Diagnosis not present

## 2021-08-27 DIAGNOSIS — R82998 Other abnormal findings in urine: Secondary | ICD-10-CM | POA: Diagnosis not present

## 2021-08-27 DIAGNOSIS — N2581 Secondary hyperparathyroidism of renal origin: Secondary | ICD-10-CM | POA: Diagnosis not present

## 2021-08-27 DIAGNOSIS — Z79899 Other long term (current) drug therapy: Secondary | ICD-10-CM | POA: Diagnosis not present

## 2021-08-27 DIAGNOSIS — D509 Iron deficiency anemia, unspecified: Secondary | ICD-10-CM | POA: Diagnosis not present

## 2021-08-27 DIAGNOSIS — M5386 Other specified dorsopathies, lumbar region: Secondary | ICD-10-CM | POA: Diagnosis not present

## 2021-08-27 DIAGNOSIS — N186 End stage renal disease: Secondary | ICD-10-CM | POA: Diagnosis not present

## 2021-08-27 DIAGNOSIS — M9903 Segmental and somatic dysfunction of lumbar region: Secondary | ICD-10-CM | POA: Diagnosis not present

## 2021-08-28 DIAGNOSIS — R82998 Other abnormal findings in urine: Secondary | ICD-10-CM | POA: Diagnosis not present

## 2021-08-28 DIAGNOSIS — D631 Anemia in chronic kidney disease: Secondary | ICD-10-CM | POA: Diagnosis not present

## 2021-08-28 DIAGNOSIS — N186 End stage renal disease: Secondary | ICD-10-CM | POA: Diagnosis not present

## 2021-08-28 DIAGNOSIS — Z79899 Other long term (current) drug therapy: Secondary | ICD-10-CM | POA: Diagnosis not present

## 2021-08-28 DIAGNOSIS — N2581 Secondary hyperparathyroidism of renal origin: Secondary | ICD-10-CM | POA: Diagnosis not present

## 2021-08-28 DIAGNOSIS — D509 Iron deficiency anemia, unspecified: Secondary | ICD-10-CM | POA: Diagnosis not present

## 2021-08-28 DIAGNOSIS — Z992 Dependence on renal dialysis: Secondary | ICD-10-CM | POA: Diagnosis not present

## 2021-08-29 DIAGNOSIS — N186 End stage renal disease: Secondary | ICD-10-CM | POA: Diagnosis not present

## 2021-08-29 DIAGNOSIS — D631 Anemia in chronic kidney disease: Secondary | ICD-10-CM | POA: Diagnosis not present

## 2021-08-29 DIAGNOSIS — Z992 Dependence on renal dialysis: Secondary | ICD-10-CM | POA: Diagnosis not present

## 2021-08-29 DIAGNOSIS — N2581 Secondary hyperparathyroidism of renal origin: Secondary | ICD-10-CM | POA: Diagnosis not present

## 2021-08-29 DIAGNOSIS — Z79899 Other long term (current) drug therapy: Secondary | ICD-10-CM | POA: Diagnosis not present

## 2021-08-29 DIAGNOSIS — R82998 Other abnormal findings in urine: Secondary | ICD-10-CM | POA: Diagnosis not present

## 2021-08-29 DIAGNOSIS — D509 Iron deficiency anemia, unspecified: Secondary | ICD-10-CM | POA: Diagnosis not present

## 2021-08-30 DIAGNOSIS — Z992 Dependence on renal dialysis: Secondary | ICD-10-CM | POA: Diagnosis not present

## 2021-08-30 DIAGNOSIS — D631 Anemia in chronic kidney disease: Secondary | ICD-10-CM | POA: Diagnosis not present

## 2021-08-30 DIAGNOSIS — D509 Iron deficiency anemia, unspecified: Secondary | ICD-10-CM | POA: Diagnosis not present

## 2021-08-30 DIAGNOSIS — R82998 Other abnormal findings in urine: Secondary | ICD-10-CM | POA: Diagnosis not present

## 2021-08-30 DIAGNOSIS — Z79899 Other long term (current) drug therapy: Secondary | ICD-10-CM | POA: Diagnosis not present

## 2021-08-30 DIAGNOSIS — N186 End stage renal disease: Secondary | ICD-10-CM | POA: Diagnosis not present

## 2021-08-30 DIAGNOSIS — N2581 Secondary hyperparathyroidism of renal origin: Secondary | ICD-10-CM | POA: Diagnosis not present

## 2021-08-31 DIAGNOSIS — R82998 Other abnormal findings in urine: Secondary | ICD-10-CM | POA: Diagnosis not present

## 2021-08-31 DIAGNOSIS — D631 Anemia in chronic kidney disease: Secondary | ICD-10-CM | POA: Diagnosis not present

## 2021-08-31 DIAGNOSIS — Z79899 Other long term (current) drug therapy: Secondary | ICD-10-CM | POA: Diagnosis not present

## 2021-08-31 DIAGNOSIS — N2581 Secondary hyperparathyroidism of renal origin: Secondary | ICD-10-CM | POA: Diagnosis not present

## 2021-08-31 DIAGNOSIS — N186 End stage renal disease: Secondary | ICD-10-CM | POA: Diagnosis not present

## 2021-08-31 DIAGNOSIS — D509 Iron deficiency anemia, unspecified: Secondary | ICD-10-CM | POA: Diagnosis not present

## 2021-08-31 DIAGNOSIS — Z992 Dependence on renal dialysis: Secondary | ICD-10-CM | POA: Diagnosis not present

## 2021-09-02 DIAGNOSIS — Z992 Dependence on renal dialysis: Secondary | ICD-10-CM | POA: Diagnosis not present

## 2021-09-02 DIAGNOSIS — Z79899 Other long term (current) drug therapy: Secondary | ICD-10-CM | POA: Diagnosis not present

## 2021-09-02 DIAGNOSIS — D509 Iron deficiency anemia, unspecified: Secondary | ICD-10-CM | POA: Diagnosis not present

## 2021-09-02 DIAGNOSIS — N2581 Secondary hyperparathyroidism of renal origin: Secondary | ICD-10-CM | POA: Diagnosis not present

## 2021-09-02 DIAGNOSIS — R82998 Other abnormal findings in urine: Secondary | ICD-10-CM | POA: Diagnosis not present

## 2021-09-02 DIAGNOSIS — N186 End stage renal disease: Secondary | ICD-10-CM | POA: Diagnosis not present

## 2021-09-02 DIAGNOSIS — D631 Anemia in chronic kidney disease: Secondary | ICD-10-CM | POA: Diagnosis not present

## 2021-09-03 ENCOUNTER — Other Ambulatory Visit: Payer: Self-pay | Admitting: Family Medicine

## 2021-09-03 ENCOUNTER — Encounter (HOSPITAL_COMMUNITY): Payer: Self-pay | Admitting: Emergency Medicine

## 2021-09-03 ENCOUNTER — Other Ambulatory Visit: Payer: Self-pay

## 2021-09-03 ENCOUNTER — Emergency Department (HOSPITAL_COMMUNITY): Payer: Medicare Other

## 2021-09-03 ENCOUNTER — Inpatient Hospital Stay (HOSPITAL_COMMUNITY)
Admission: EM | Admit: 2021-09-03 | Discharge: 2021-09-05 | DRG: 280 | Disposition: A | Discharge status: Home / Self Care | Payer: Medicare Other | Attending: Family Medicine | Admitting: Family Medicine

## 2021-09-03 DIAGNOSIS — Z992 Dependence on renal dialysis: Secondary | ICD-10-CM | POA: Diagnosis present

## 2021-09-03 DIAGNOSIS — M9903 Segmental and somatic dysfunction of lumbar region: Secondary | ICD-10-CM | POA: Diagnosis not present

## 2021-09-03 DIAGNOSIS — M9905 Segmental and somatic dysfunction of pelvic region: Secondary | ICD-10-CM | POA: Diagnosis not present

## 2021-09-03 DIAGNOSIS — I10 Essential (primary) hypertension: Secondary | ICD-10-CM | POA: Diagnosis not present

## 2021-09-03 DIAGNOSIS — R002 Palpitations: Secondary | ICD-10-CM | POA: Diagnosis not present

## 2021-09-03 DIAGNOSIS — E782 Mixed hyperlipidemia: Secondary | ICD-10-CM | POA: Diagnosis present

## 2021-09-03 DIAGNOSIS — Z7982 Long term (current) use of aspirin: Secondary | ICD-10-CM

## 2021-09-03 DIAGNOSIS — Z20822 Contact with and (suspected) exposure to covid-19: Secondary | ICD-10-CM | POA: Diagnosis present

## 2021-09-03 DIAGNOSIS — Z820 Family history of epilepsy and other diseases of the nervous system: Secondary | ICD-10-CM | POA: Diagnosis not present

## 2021-09-03 DIAGNOSIS — E039 Hypothyroidism, unspecified: Secondary | ICD-10-CM | POA: Diagnosis not present

## 2021-09-03 DIAGNOSIS — I5032 Chronic diastolic (congestive) heart failure: Secondary | ICD-10-CM | POA: Diagnosis not present

## 2021-09-03 DIAGNOSIS — Z85528 Personal history of other malignant neoplasm of kidney: Secondary | ICD-10-CM | POA: Diagnosis not present

## 2021-09-03 DIAGNOSIS — Z87891 Personal history of nicotine dependence: Secondary | ICD-10-CM | POA: Diagnosis not present

## 2021-09-03 DIAGNOSIS — I471 Supraventricular tachycardia: Secondary | ICD-10-CM | POA: Diagnosis not present

## 2021-09-03 DIAGNOSIS — B20 Human immunodeficiency virus [HIV] disease: Secondary | ICD-10-CM | POA: Diagnosis present

## 2021-09-03 DIAGNOSIS — Z8249 Family history of ischemic heart disease and other diseases of the circulatory system: Secondary | ICD-10-CM

## 2021-09-03 DIAGNOSIS — Z79899 Other long term (current) drug therapy: Secondary | ICD-10-CM

## 2021-09-03 DIAGNOSIS — I21A1 Myocardial infarction type 2: Secondary | ICD-10-CM | POA: Diagnosis present

## 2021-09-03 DIAGNOSIS — M9904 Segmental and somatic dysfunction of sacral region: Secondary | ICD-10-CM | POA: Diagnosis not present

## 2021-09-03 DIAGNOSIS — I209 Angina pectoris, unspecified: Secondary | ICD-10-CM | POA: Diagnosis not present

## 2021-09-03 DIAGNOSIS — I517 Cardiomegaly: Secondary | ICD-10-CM | POA: Diagnosis not present

## 2021-09-03 DIAGNOSIS — I2510 Atherosclerotic heart disease of native coronary artery without angina pectoris: Secondary | ICD-10-CM | POA: Diagnosis not present

## 2021-09-03 DIAGNOSIS — D631 Anemia in chronic kidney disease: Secondary | ICD-10-CM | POA: Diagnosis not present

## 2021-09-03 DIAGNOSIS — R0789 Other chest pain: Secondary | ICD-10-CM

## 2021-09-03 DIAGNOSIS — D509 Iron deficiency anemia, unspecified: Secondary | ICD-10-CM | POA: Diagnosis not present

## 2021-09-03 DIAGNOSIS — N186 End stage renal disease: Secondary | ICD-10-CM | POA: Diagnosis present

## 2021-09-03 DIAGNOSIS — G4733 Obstructive sleep apnea (adult) (pediatric): Secondary | ICD-10-CM | POA: Diagnosis not present

## 2021-09-03 DIAGNOSIS — Z905 Acquired absence of kidney: Secondary | ICD-10-CM

## 2021-09-03 DIAGNOSIS — R7989 Other specified abnormal findings of blood chemistry: Secondary | ICD-10-CM

## 2021-09-03 DIAGNOSIS — I214 Non-ST elevation (NSTEMI) myocardial infarction: Secondary | ICD-10-CM | POA: Diagnosis not present

## 2021-09-03 DIAGNOSIS — D649 Anemia, unspecified: Secondary | ICD-10-CM | POA: Diagnosis not present

## 2021-09-03 DIAGNOSIS — E876 Hypokalemia: Secondary | ICD-10-CM | POA: Diagnosis not present

## 2021-09-03 DIAGNOSIS — I340 Nonrheumatic mitral (valve) insufficiency: Secondary | ICD-10-CM | POA: Diagnosis not present

## 2021-09-03 DIAGNOSIS — R079 Chest pain, unspecified: Secondary | ICD-10-CM | POA: Diagnosis not present

## 2021-09-03 DIAGNOSIS — Z23 Encounter for immunization: Secondary | ICD-10-CM

## 2021-09-03 DIAGNOSIS — R778 Other specified abnormalities of plasma proteins: Secondary | ICD-10-CM

## 2021-09-03 DIAGNOSIS — I5030 Unspecified diastolic (congestive) heart failure: Secondary | ICD-10-CM | POA: Diagnosis not present

## 2021-09-03 DIAGNOSIS — N2581 Secondary hyperparathyroidism of renal origin: Secondary | ICD-10-CM | POA: Diagnosis not present

## 2021-09-03 DIAGNOSIS — I132 Hypertensive heart and chronic kidney disease with heart failure and with stage 5 chronic kidney disease, or end stage renal disease: Secondary | ICD-10-CM | POA: Diagnosis present

## 2021-09-03 DIAGNOSIS — M109 Gout, unspecified: Secondary | ICD-10-CM

## 2021-09-03 DIAGNOSIS — M5386 Other specified dorsopathies, lumbar region: Secondary | ICD-10-CM | POA: Diagnosis not present

## 2021-09-03 DIAGNOSIS — R82998 Other abnormal findings in urine: Secondary | ICD-10-CM | POA: Diagnosis not present

## 2021-09-03 DIAGNOSIS — I5031 Acute diastolic (congestive) heart failure: Secondary | ICD-10-CM

## 2021-09-03 DIAGNOSIS — J811 Chronic pulmonary edema: Secondary | ICD-10-CM | POA: Diagnosis not present

## 2021-09-03 LAB — CBC
HCT: 31.4 % — ABNORMAL LOW (ref 39.0–52.0)
Hemoglobin: 11.1 g/dL — ABNORMAL LOW (ref 13.0–17.0)
MCH: 32.1 pg (ref 26.0–34.0)
MCHC: 35.4 g/dL (ref 30.0–36.0)
MCV: 90.8 fL (ref 80.0–100.0)
Platelets: 257 10*3/uL (ref 150–400)
RBC: 3.46 MIL/uL — ABNORMAL LOW (ref 4.22–5.81)
RDW: 15.8 % — ABNORMAL HIGH (ref 11.5–15.5)
WBC: 8.2 10*3/uL (ref 4.0–10.5)
nRBC: 0 % (ref 0.0–0.2)

## 2021-09-03 LAB — I-STAT CHEM 8, ED
BUN: 60 mg/dL — ABNORMAL HIGH (ref 6–20)
Calcium, Ion: 1.04 mmol/L — ABNORMAL LOW (ref 1.15–1.40)
Chloride: 102 mmol/L (ref 98–111)
Creatinine, Ser: >18 mg/dL — ABNORMAL HIGH (ref 0.61–1.24)
Glucose, Bld: 86 mg/dL (ref 70–99)
HCT: 33 % — ABNORMAL LOW (ref 39.0–52.0)
Hemoglobin: 11.2 g/dL — ABNORMAL LOW (ref 13.0–17.0)
Potassium: 3.3 mmol/L — ABNORMAL LOW (ref 3.5–5.1)
Sodium: 137 mmol/L (ref 135–145)
TCO2: 22 mmol/L (ref 22–32)

## 2021-09-03 LAB — COMPREHENSIVE METABOLIC PANEL
ALT: 15 U/L (ref 0–44)
AST: 22 U/L (ref 15–41)
Albumin: 4.2 g/dL (ref 3.5–5.0)
Alkaline Phosphatase: 73 U/L (ref 38–126)
Anion gap: 20 — ABNORMAL HIGH (ref 5–15)
BUN: 67 mg/dL — ABNORMAL HIGH (ref 6–20)
CO2: 22 mmol/L (ref 22–32)
Calcium: 10.3 mg/dL (ref 8.9–10.3)
Chloride: 96 mmol/L — ABNORMAL LOW (ref 98–111)
Creatinine, Ser: 16.14 mg/dL — ABNORMAL HIGH (ref 0.61–1.24)
GFR, Estimated: 3 mL/min — ABNORMAL LOW (ref >60)
Glucose, Bld: 86 mg/dL (ref 70–99)
Potassium: 3.4 mmol/L — ABNORMAL LOW (ref 3.5–5.1)
Sodium: 138 mmol/L (ref 135–145)
Total Bilirubin: 0.8 mg/dL (ref 0.3–1.2)
Total Protein: 7.9 g/dL (ref 6.5–8.1)

## 2021-09-03 LAB — RESP PANEL BY RT-PCR (FLU A&B, COVID) ARPGX2
Influenza A by PCR: NEGATIVE
Influenza B by PCR: NEGATIVE
SARS Coronavirus 2 by RT PCR: NEGATIVE

## 2021-09-03 LAB — TROPONIN I (HIGH SENSITIVITY)
Troponin I (High Sensitivity): 108 ng/L (ref <18)
Troponin I (High Sensitivity): 109 ng/L (ref <18)

## 2021-09-03 LAB — TSH: TSH: 1.87 u[IU]/mL (ref 0.350–4.500)

## 2021-09-03 IMAGING — DX DG CHEST 1V PORT
1 series · 1 of 1 positions shown · non-contrast
Comparison: [DATE].

CLINICAL DATA: Chest pain.

EXAM:
PORTABLE CHEST 1 VIEW

[chest]
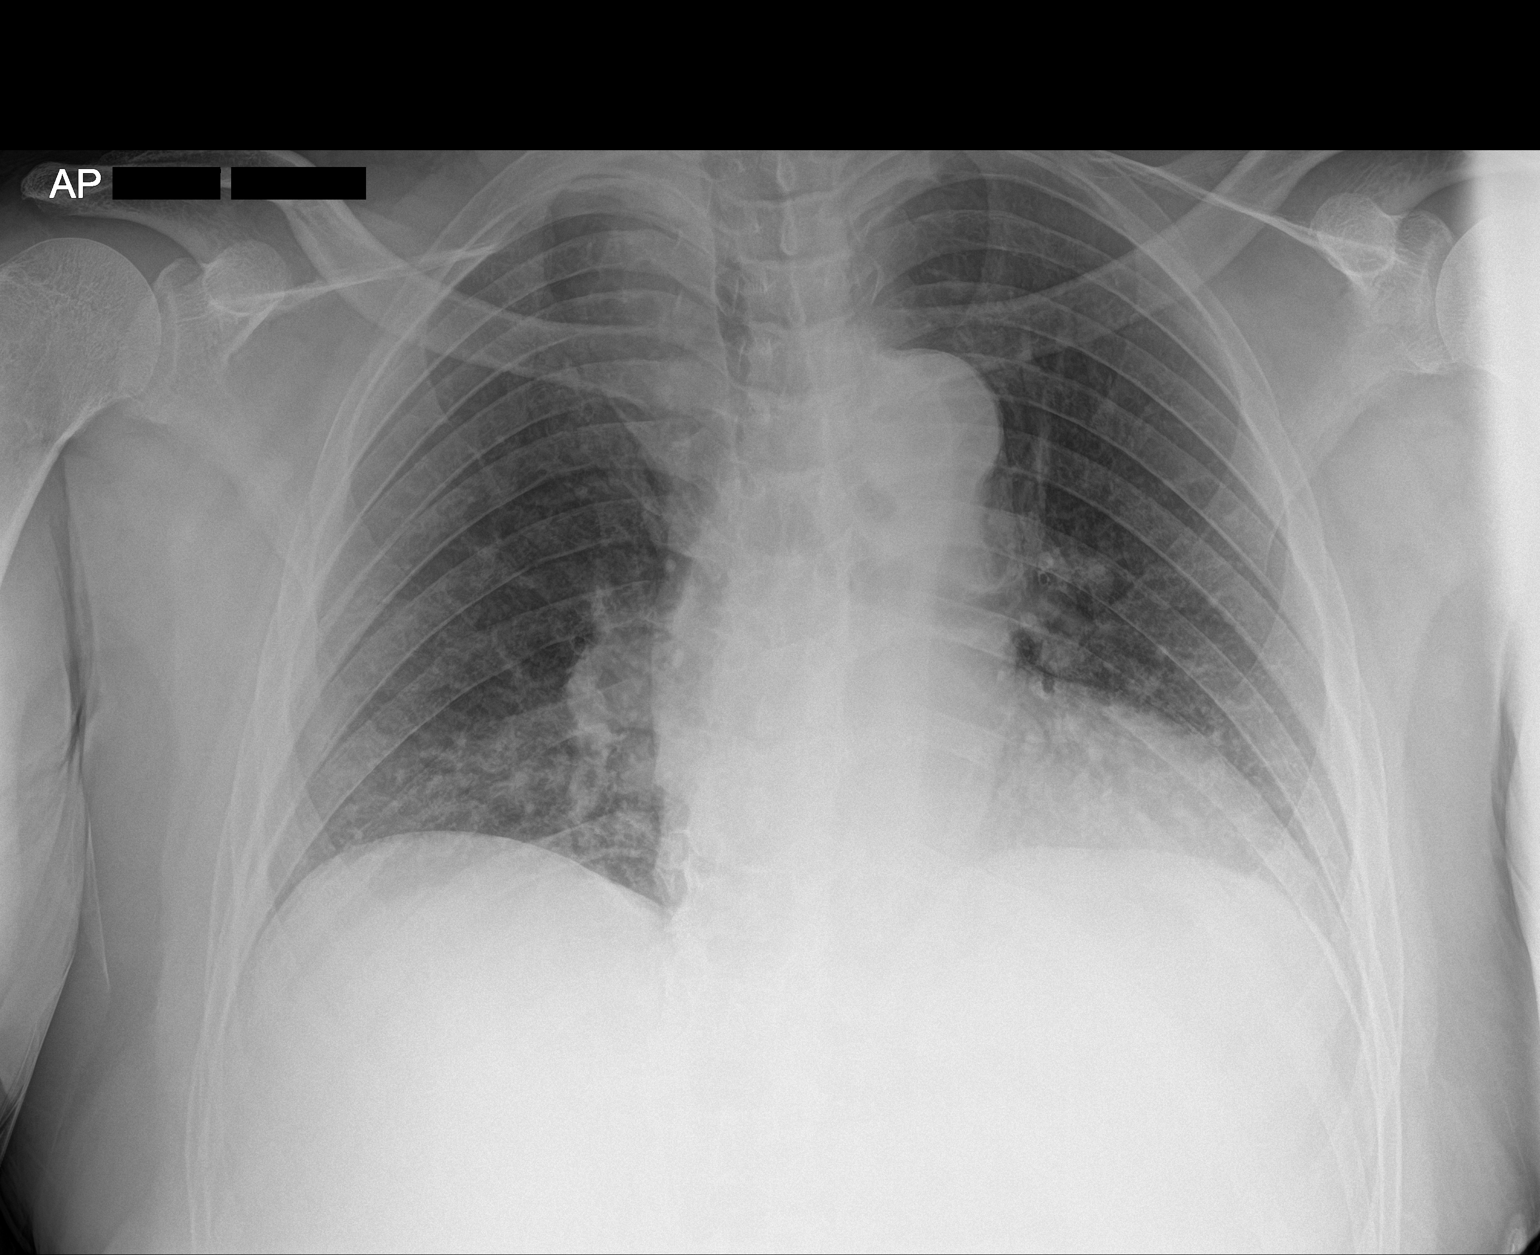

[1 of 1 positions shown; findings below may reference images not displayed]

FINDINGS: Mild enlargement of the cardiac silhouette. Pulmonary vascular
congestion. Low lung volumes with mild bibasilar opacities. No
visible pleural effusions or pneumothorax. No acute osseous
abnormality.
IMPRESSION: 1. Mild cardiomegaly and pulmonary vascular congestion.
2. Low lung volumes with mild bibasilar opacities, likely
atelectasis.

## 2021-09-03 MED ORDER — HYDRALAZINE HCL 50 MG PO TABS
100.0000 mg | ORAL_TABLET | Freq: Three times a day (TID) | ORAL | Status: DC
  Administered 2021-09-03 – 2021-09-05 (×4): 100 mg via ORAL
  Filled 2021-09-03 (×5): qty 2

## 2021-09-03 MED ORDER — FENTANYL CITRATE PF 50 MCG/ML IJ SOSY
50.0000 ug | PREFILLED_SYRINGE | Freq: Once | INTRAMUSCULAR | Status: AC
  Administered 2021-09-03: 50 ug via INTRAVENOUS
  Filled 2021-09-03: qty 1

## 2021-09-03 MED ORDER — SEVELAMER CARBONATE 800 MG PO TABS
1600.0000 mg | ORAL_TABLET | Freq: Three times a day (TID) | ORAL | Status: DC
  Filled 2021-09-03: qty 2

## 2021-09-03 MED ORDER — HEPARIN (PORCINE) 25000 UT/250ML-% IV SOLN
1450.0000 [IU]/h | INTRAVENOUS | Status: DC
  Administered 2021-09-03: 1150 [IU]/h via INTRAVENOUS
  Administered 2021-09-04 (×2): 1450 [IU]/h via INTRAVENOUS
  Filled 2021-09-03 (×2): qty 250

## 2021-09-03 MED ORDER — SODIUM CHLORIDE 0.9% FLUSH
3.0000 mL | Freq: Two times a day (BID) | INTRAVENOUS | Status: DC
  Administered 2021-09-05: 3 mL via INTRAVENOUS

## 2021-09-03 MED ORDER — ACETAMINOPHEN 325 MG PO TABS: 650.0000 mg | ORAL_TABLET | Freq: Four times a day (QID) | ORAL | Status: DC | PRN

## 2021-09-03 MED ORDER — HEPARIN BOLUS VIA INFUSION
4000.0000 [IU] | Freq: Once | INTRAVENOUS | Status: AC
  Administered 2021-09-03: 4000 [IU] via INTRAVENOUS
  Filled 2021-09-03: qty 4000

## 2021-09-03 MED ORDER — ALBUTEROL SULFATE (2.5 MG/3ML) 0.083% IN NEBU: 2.5000 mg | INHALATION_SOLUTION | RESPIRATORY_TRACT | Status: DC | PRN

## 2021-09-03 MED ORDER — ASPIRIN 81 MG PO CHEW
324.0000 mg | CHEWABLE_TABLET | Freq: Once | ORAL | Status: AC
  Administered 2021-09-03: 324 mg via ORAL
  Filled 2021-09-03: qty 4

## 2021-09-03 MED ORDER — SODIUM CHLORIDE 0.9% FLUSH: 3.0000 mL | INTRAVENOUS | Status: DC | PRN

## 2021-09-03 MED ORDER — ASPIRIN EC 81 MG PO TBEC
81.0000 mg | DELAYED_RELEASE_TABLET | Freq: Every morning | ORAL | Status: DC
  Administered 2021-09-04 – 2021-09-05 (×2): 81 mg via ORAL
  Filled 2021-09-03 (×2): qty 1

## 2021-09-03 MED ORDER — METOPROLOL TARTRATE 25 MG PO TABS
25.0000 mg | ORAL_TABLET | Freq: Two times a day (BID) | ORAL | Status: DC
  Administered 2021-09-03 – 2021-09-04 (×2): 25 mg via ORAL
  Filled 2021-09-03 (×2): qty 1

## 2021-09-03 MED ORDER — CALCITRIOL 0.5 MCG PO CAPS
0.5000 ug | ORAL_CAPSULE | ORAL | Status: DC
  Administered 2021-09-04: 0.5 ug via ORAL
  Filled 2021-09-03 (×2): qty 1

## 2021-09-03 MED ORDER — ACETAMINOPHEN 650 MG RE SUPP: 650.0000 mg | Freq: Four times a day (QID) | RECTAL | Status: DC | PRN

## 2021-09-03 MED ORDER — NITROGLYCERIN 2 % TD OINT
1.0000 [in_us] | TOPICAL_OINTMENT | Freq: Once | TRANSDERMAL | Status: AC
  Administered 2021-09-03: 1 [in_us] via TOPICAL
  Filled 2021-09-03: qty 1

## 2021-09-03 MED ORDER — ATORVASTATIN CALCIUM 40 MG PO TABS
40.0000 mg | ORAL_TABLET | Freq: Every day | ORAL | Status: DC
  Administered 2021-09-03 – 2021-09-05 (×3): 40 mg via ORAL
  Filled 2021-09-03 (×3): qty 1

## 2021-09-03 MED ORDER — RENA-VITE PO TABS
1.0000 | ORAL_TABLET | Freq: Every day | ORAL | Status: DC
Start: 2021-09-03 — End: 2021-09-05
  Administered 2021-09-03 – 2021-09-04 (×2): 1 via ORAL
  Filled 2021-09-03 (×2): qty 1

## 2021-09-03 MED ORDER — METOCLOPRAMIDE HCL 5 MG/ML IJ SOLN
10.0000 mg | Freq: Once | INTRAMUSCULAR | Status: AC
  Administered 2021-09-03: 10 mg via INTRAVENOUS
  Filled 2021-09-03: qty 2

## 2021-09-03 MED ORDER — ALBUTEROL SULFATE HFA 108 (90 BASE) MCG/ACT IN AERS: 2.0000 | INHALATION_SPRAY | RESPIRATORY_TRACT | Status: DC | PRN

## 2021-09-03 MED ORDER — AMLODIPINE BESYLATE 10 MG PO TABS
10.0000 mg | ORAL_TABLET | Freq: Every day | ORAL | Status: DC
  Administered 2021-09-04: 10 mg via ORAL
  Filled 2021-09-03: qty 1

## 2021-09-03 MED ORDER — LEVOTHYROXINE SODIUM 25 MCG PO TABS
25.0000 ug | ORAL_TABLET | Freq: Every day | ORAL | Status: DC
Start: 2021-09-04 — End: 2021-09-05
  Administered 2021-09-04 – 2021-09-05 (×2): 25 ug via ORAL
  Filled 2021-09-03 (×2): qty 1

## 2021-09-03 MED ORDER — SODIUM CHLORIDE 0.9 % IV SOLN: 250.0000 mL | INTRAVENOUS | Status: DC | PRN

## 2021-09-03 MED ORDER — NITROGLYCERIN 2 % TD OINT
1.0000 [in_us] | TOPICAL_OINTMENT | Freq: Four times a day (QID) | TRANSDERMAL | Status: DC
  Administered 2021-09-04 – 2021-09-05 (×5): 1 [in_us] via TOPICAL
  Filled 2021-09-03: qty 30

## 2021-09-03 MED ORDER — BICTEGRAVIR-EMTRICITAB-TENOFOV 50-200-25 MG PO TABS
1.0000 | ORAL_TABLET | Freq: Every day | ORAL | Status: DC
  Administered 2021-09-04 – 2021-09-05 (×2): 1 via ORAL
  Filled 2021-09-03 (×2): qty 1

## 2021-09-03 NOTE — Consult Note (Addendum)
Cardiology Consult:   Patient ID: Albert Eaton MRN: [id number]; DOB: 1966/10/26   Admission date: 09/03/2021  PCP:  [name], [place] [facility] Cardiologist:  [name], MD  Electrophysiologist:  Vickie Epley, MD       Chief Complaint:  chest pain  Patient Profile:   Albert Eaton is a 55 y.o. male with D-CHF, ESRD on PD, nl nuc 2017, HIV (undetectable), HTN, hypothyroid, hep B (treated), OSA not on CPAP,  who is being seen 09/03/2021 for the evaluation of chest pain at the request of Dr Doren Custard  History of Present Illness:   Mr. Kary has had a normal stress test in the past, no cath/PCI.   He developed chest pain about 2 pm, at rest. He sat on the couch no change w/ position change, +SOB, 8/10 at its worst, but mainly a 5-6/10, a pressure.   He thought it was ok to start PD. When he filled, the pain reached an 8/10. He drained and came to the ER.   In the ER, the pain is a 1-2/10 after ASA and fentanyl.   He has palpitations a lot, he will get the pain with these, but it is slight.   Since it started, it has not gone away.   His Cr is very high all the time. Today, is was 16.14, then >18. K+ is 3.3, it is always low.   CBG is good, A1c was 5.4 10/13, ionized Ca++ slightly low at 1.04.   Past Medical History:  Diagnosis Date   Anemia    Aortic atherosclerosis (HCC)    Arthritis    Asthma    Cancer ([place])    renal cyst   Chronic diastolic CHF (congestive heart failure) ([place]) 01/06/2017   Echo 7/16 [facility] in [place], [name]) Mild AI, mild LAE, mild concentric LVH, EF 55, normal wall motion, mild to moderate MR, mild PI, RVSP 55 // Echo 10/09/16 ([facility]):  Moderate LVH, grade 2 diastolic dysfunction, mild MR, moderate LAE    CKD (chronic kidney disease)    Dialysis Mon Wed Fri   Esophagitis    GERD (gastroesophageal reflux disease)    Gout    no current problems   Heart murmur    never caused any problems   Hepatitis    Hep B    History of nuclear stress test    a. Nuc study 7/16: no scar or ischemia, EF 42 // b. Nuc study 12/17: EF 48, ?small apical ischemia, Low Risk   HIV (human immunodeficiency virus infection) ([place])    HLD (hyperlipidemia)    Hypertension    Hypertensive heart disease with CHF (congestive heart failure) ([place]) 10/09/2016   Hypothyroidism    Mitral regurgitation    OSA (obstructive sleep apnea)    Post-operative nausea and vomiting    1 time as a child, no problems as an adult   Sleep apnea    uses c-pap   Thyroid disease    Wears glasses    Wears partial dentures     Past Surgical History:  Procedure Laterality Date   AV FISTULA PLACEMENT Left 07/02/2018   Procedure: Creation of Left arm BRACHIOBASILIC ARTERIOVENOUS  FISTULA;  Surgeon: Marty Heck, MD;  Location: [place];  Service: Vascular;  Laterality: Left;   [place] Left 08/27/2018   Procedure: Left arm BRACHIOBASILIC VEIN TRANSPOSITION SECOND STAGE;  Surgeon: Marty Heck, MD;  Location: [place];  Service: Vascular;  Laterality: Left;   BUBBLE STUDY  09/03/2020   Procedure: BUBBLE STUDY;  Surgeon: Fay Records, MD;  Location: [place];  Service: Cardiovascular;;   CAPD INSERTION N/A 05/30/2021   Procedure: LAPAROSCOPIC INSERTION CONTINUOUS AMBULATORY PERITONEAL DIALYSIS  (CAPD) CATHETER;  Surgeon: [name], MD;  Location: [place];  Service: Vascular;  Laterality: N/A;   CHOLECYSTECTOMY     COLONOSCOPY W/ BIOPSIES AND POLYPECTOMY     GIVENS CAPSULE STUDY N/A 03/01/2018   Procedure: GIVENS CAPSULE STUDY;  Surgeon: Jerene Bears, MD;  Location: [facility];  Service: Gastroenterology;  Laterality: N/A;   HERNIA REPAIR     As baby   MULTIPLE TOOTH EXTRACTIONS     NEPHRECTOMY Left 02/18/2021   NOSE SURGERY     RENAL BIOPSY     TEE WITHOUT CARDIOVERSION N/A 09/03/2020   Procedure: TRANSESOPHAGEAL ECHOCARDIOGRAM (TEE);  Surgeon: Fay Records, MD;  Location: [facility] ENDOSCOPY;  Service:  Cardiovascular;  Laterality: N/A;   UPPER GI ENDOSCOPY  04/05/2021     Medications Prior to Admission: Prior to Admission medications   Medication Sig Start Date End Date Taking? Authorizing Provider  ACETAMINOPHEN EXTRA STRENGTH 500 MG tablet Take 500 mg by mouth every 6 (six) hours as needed (pain.). 02/20/21   [provider]  albuterol (PROAIR HFA) 108 (90 Base) MCG/ACT inhaler INHALE 2 PUFFS INTO THE LUNGS EVERY 4 HOURS AS NEEDED FOR WHEEZING OR SHORTNESS OF BREATH 08/07/21   Valentina Shaggy, MD  allopurinol (ZYLOPRIM) 100 MG tablet TAKE 1 TABLET(100 MG) BY MOUTH TWICE DAILY FOR 60 DOSES 09/03/21   [name], FNP  amLODipine (NORVASC) 10 MG tablet Take 1 tablet (10 mg total) by mouth daily. Patient taking differently: Take 10 mg by mouth daily at 12 noon. At midday 10/31/16   [name], FNP  aspirin EC 81 MG tablet Take 1 tablet (81 mg total) by mouth daily. Swallow whole. Patient taking differently: Take 81 mg by mouth in the morning. Swallow whole. 03/06/21   Richardson Dopp T, PA-C  azelastine (ASTELIN) 0.1 % nasal spray PLACE 2 SPRAYS INTO EACH NOSTRIL TWICE DAILY AS NEEDED FOR RHINITIS AS DIRECTED Patient taking differently: Place 2 sprays into both nostrils 2 (two) times daily as needed for allergies or rhinitis. 06/05/21   Valentina Shaggy, MD  BIKTARVY [phone] MG TABS tablet TAKE 1 TABLET BY MOUTH DAILY 07/08/21   Campbell Riches, MD  calcitRIOL (ROCALTROL) 0.5 MCG capsule Take 0.5 mcg by mouth every Monday, Wednesday, and Friday. 02/06/21   [provider]  carvedilol (COREG) 25 MG tablet TAKE 1 TABLET BY MOUTH TWICE DAILY WITH FOOD Patient taking differently: Take 25 mg by mouth 2 (two) times daily with a meal. 12/31/20   [name], MD  chlorproMAZINE (THORAZINE) 10 MG tablet Take 1 tablet (10 mg total) by mouth 3 (three) times daily as needed for hiccoughs. Patient taking differently: Take 10 mg by mouth 3 (three) times daily. 02/26/21    [name], FNP  clobetasol (OLUX) 0.05 % topical foam Apply topically 2 (two) times daily. 08/21/21   Lavonna Monarch, MD  clonazePAM (KLONOPIN) 0.5 MG tablet Take 1 tablet (0.5 mg total) by mouth at bedtime. 01/02/21   Laurin Coder, MD  clotrimazole-betamethasone (LOTRISONE) cream Apply topically 2 (two) times daily. X 3-5 days. IF symptoms continue, call office Patient taking differently: Apply 1 application topically 2 (two) times daily as needed (rectal discomfort). 05/16/21   Pyrtle, Lajuan Lines, MD  docusate sodium (COLACE) 100 MG capsule Take 100 mg by mouth 2 (two) times daily. 02/20/21   [provider]  fluticasone (FLOVENT HFA) 110 MCG/ACT inhaler INHALE 2 PUFFS INTO THE LUNGS TWICE DAILY 06/24/21   Valentina Shaggy, MD  gabapentin (NEURONTIN) 300 MG capsule TAKE 1 CAPSULE(300 MG) BY MOUTH THREE TIMES DAILY Patient taking differently: Take 300 mg by mouth 3 (three) times daily. 10/09/20   [name], FNP  gentamicin cream (GARAMYCIN) 0.1 % Apply 1 application topically See admin instructions. Apply when catheter is flushed and cleaned 05/28/21   [provider]  hydrALAZINE (APRESOLINE) 100 MG tablet Take 1 tablet (100 mg total) by mouth 3 (three) times daily. 04/02/21 04/02/22  [name], MD  hyoscyamine (LEVSIN SL) 0.125 MG SL tablet Place 1-2 tablets (0.125-0.25 mg total) under the tongue every 8 (eight) hours as needed (lower abdominal pain, spasm). 08/05/19   Pyrtle, Lajuan Lines, MD  ipratropium (ATROVENT) 0.06 % nasal spray USE 2 SPRAYS IN EACH NOSTRIL 2 TO 3 TIMES DAILY AS NEEDED Patient taking differently: Place 2 sprays into both nostrils 3 (three) times daily as needed (allergies). 09/03/20   Valentina Shaggy, MD  iron sucrose (VENOFER) 20 MG/ML injection Inject into the vein. 07/12/20   [provider]  lactulose (CHRONULAC) 10 GM/15ML solution Take 20 g by mouth daily as needed for moderate constipation.  07/08/20   [provider]   levothyroxine (SYNTHROID) 25 MCG tablet TAKE 1 TABLET BY MOUTH DAILY BEFORE BREAKFAST Patient taking differently: Take 25 mcg by mouth daily before breakfast. 02/05/21   [name], FNP  lidocaine-prilocaine (EMLA) cream Apply 1 application topically See admin instructions. Apply to access site 1-2 hours before dialysis 06/01/19   [provider]  lubiprostone (AMITIZA) 24 MCG capsule TAKE 1 CAPSULE(24 MCG) BY MOUTH TWICE DAILY WITH A MEAL Patient taking differently: Take 24 mcg by mouth in the morning and at bedtime. 05/21/21   Pyrtle, Lajuan Lines, MD  methocarbamol (ROBAXIN) 500 MG tablet Take 1 tablet (500 mg total) by mouth every 8 (eight) hours as needed for muscle spasms. Patient taking differently: Take 500 mg by mouth at bedtime. 02/11/21   Maudie Flakes, MD  Methoxy PEG-Epoetin Beta (MIRCERA IJ) Inject into the skin. 11/08/20   [provider]  multivitamin (RENA-VIT) TABS tablet Take 1 tablet by mouth at bedtime. 01/10/21   [provider]  mupirocin ointment (BACTROBAN) 2 % Apply 1 application topically daily as needed (apply to port after dialysis).  04/24/20   [provider]  Olopatadine HCl (PAZEO) 0.7 % SOLN Place 1 drop into both eyes daily. Patient taking differently: Place 1 drop into both eyes daily as needed (itchy eyes). 05/24/20   Valentina Shaggy, MD  oxyCODONE-acetaminophen (PERCOCET) 10-325 MG tablet Take 1 tablet by mouth every 6 (six) hours as needed for pain. 05/30/21 05/30/22  Setzer, Edman Circle, PA-C  pantoprazole (PROTONIX) 40 MG tablet TAKE 1 TABLET(40 MG) BY MOUTH DAILY 08/07/21   Esterwood, Amy S, PA-C  predniSONE (DELTASONE) 10 MG tablet Take 1 tablet (10 mg total) by mouth daily with breakfast. 08/06/21   Suzan Slick, NP  rOPINIRole (REQUIP) 0.5 MG tablet Take 0.5 mg by mouth at bedtime. 02/05/21   [provider]  rOPINIRole (REQUIP) 1 MG tablet Take 1 tablet (1 mg total) by mouth at bedtime. 08/12/21   Olalere, Cicero Duck  A, MD  rosuvastatin (CRESTOR) 10 MG tablet Take 1 tablet (10 mg total)  by mouth daily. Patient taking differently: Take 10 mg by mouth in the morning. 03/06/21 06/17/21  Richardson Dopp T, PA-C  senna (SENOKOT) 8.6 MG TABS tablet Take 2 tablets by mouth every evening. 02/20/21   [provider]  sevelamer carbonate (RENVELA) 800 MG tablet Take 1,600 mg by mouth 3 (three) times daily with meals. 11/09/20   [provider]  Spacer/Aero-Holding Josiah Lobo DEVI 1 Device by Does not apply route 2 (two) times a day. 03/01/19   Bobbitt, Sedalia Muta, MD  Vitamin D, Ergocalciferol, (DRISDOL) 1.25 MG (50000 UNIT) CAPS capsule TAKE 1 CAPSULE BY MOUTH EVERY 7 DAYS Patient taking differently: Take 50,000 Units by mouth every Tuesday. At midday 02/05/21   [name], FNP     Allergies:    Allergies  Allergen Reactions   Ace Inhibitors Cough and Other (See Comments)   Lisinopril Cough    Social History:   Social History   Socioeconomic History   Marital status: Single    Spouse name: Not on file   Number of children: Not on file   Years of education: Not on file   Highest education level: Not on file  Occupational History   Not on file  Tobacco Use   Smoking status: Former    Packs/day: 1.00    Types: Cigarettes    Quit date: 2000    Years since quitting: 22.8   Smokeless tobacco: Never  Vaping Use   Vaping Use: Never used  Substance and Sexual Activity   Alcohol use: Yes    Comment: occasionally liquor   Drug use: No   Sexual activity: Not Currently    Partners: Male    Birth control/protection: Condom    Comment: declined  condoms 07/11/20  Other Topics Concern   Not on file  Social History Narrative   Not on file   Social Determinants of Health   Financial Resource Strain: Not on file  Food Insecurity: No Food Insecurity   Worried About Charity fundraiser in the Last Year: Never true   [place] in the Last Year: Never true  Transportation Needs: No  Transportation Needs   Lack of Transportation (Medical): No   Lack of Transportation (Non-Medical): No  Physical Activity: Not on file  Stress: Not on file  Social Connections: Not on file  Intimate Partner Violence: Not on file    Family History:   The patient's family history includes Heart attack (age of onset: 20) in his maternal grandmother; Heart failure in his mother; Hypertension in his brother, sister, and sister; Multiple sclerosis in his sister; Scoliosis in an other family member. There is no history of Allergic rhinitis, Angioedema, Asthma, Eczema, Immunodeficiency, Urticaria, Colon cancer, Pancreatic cancer, or Esophageal cancer.    ROS:  Please see the history of present illness.  All other ROS reviewed and negative.     Physical Exam/Data:   Vitals:   09/03/21 1845 09/03/21 1900 09/03/21 2000 09/03/21 2020  BP:  (!) 153/96 (!) 178/105 (!) 145/114  Pulse: 97 95 96 93  Resp: (!) 28 (!) 23 (!) 26 16  Temp:      TempSrc:      SpO2: 100% 100% 98% 98%  Weight:      Height:       No intake or output data in the 24 hours ending 09/03/21 2036 Last 3 Weights 09/03/2021 08/12/2021 08/08/2021  Weight (lbs) 197 lb 209 lb 201 lb 3.2 oz  Weight (kg) 89.359  kg 94.802 kg 91.264 kg     Body mass index is 28.27 kg/m.  General:  Well nourished, well developed, in no acute distress HEENT: normal Neck:  JVD 10 cm Vascular: No carotid bruits; Distal pulses 2+ bilaterally   Cardiac:  normal S1, S2; RRR; 2/6 murmur  Lungs: Generally clear, but some rales bases to auscultation bilaterally, no wheezing, rhonchi  Abd: soft, nontender, no hepatomegaly  Ext: no edema Musculoskeletal:  No deformities, BUE and BLE strength normal and equal Skin: warm and dry  Neuro:  CNs 2-12 intact, no focal abnormalities noted Psych:  Normal affect    EKG:  The ECG that was done today was personally reviewed and demonstrates SR, HR 92, minor ST/T wave changes from 07/16/2021  Telemetry: Sinus  rhythm with occasional PVCs and a run of atrial tachycardia  Relevant CV Studies:  ECHO: 03/04/2021  1. Left ventricular ejection fraction, by estimation, is 60 to 65%. The  left ventricle has normal function. The left ventricle has no regional  wall motion abnormalities. There is mild left ventricular hypertrophy.  Left ventricular diastolic parameters were normal.   2. Right ventricular systolic function is normal. The right ventricular  size is normal. There is mildly elevated pulmonary artery systolic  pressure.   3. Left atrial size was severely dilated.   4. Right atrial size was mildly dilated.   5. The mitral valve is normal in structure. Moderate to severe mitral  valve regurgitation. No evidence of mitral stenosis.   6. The aortic valve is normal in structure. Aortic valve regurgitation is  not visualized. No aortic stenosis is present.   7. borderline dilatation of the ascending aorta, measuring 39 mm.   8. The inferior vena cava is normal in size with greater than 50%  respiratory variability, suggesting right atrial pressure of 3 mmHg.   Laboratory Data:  High Sensitivity Troponin:   Recent Labs  Lab 09/03/21 1732  TROPONINIHS 108*      Chemistry Recent Labs  Lab 09/03/21 1732 09/03/21 1739  NA 138 137  K 3.4* 3.3*  CL 96* 102  CO2 22  --   GLUCOSE 86 86  BUN 67* 60*  CREATININE 16.14* >18.00*  CALCIUM 10.3  --   GFRNONAA 3*  --   ANIONGAP 20*  --     Recent Labs  Lab 09/03/21 1732  PROT 7.9  ALBUMIN 4.2  AST 22  ALT 15  ALKPHOS 73  BILITOT 0.8   Lipids  Lab Results  Component Value Date   CHOL 137 06/27/2021   HDL 33 (L) 06/27/2021   LDLCALC 83 06/27/2021   TRIG 114 06/27/2021   CHOLHDL 4.2 06/27/2021    Hematology Recent Labs  Lab 09/03/21 1732 09/03/21 1739  WBC 8.2  --   RBC 3.46*  --   HGB 11.1* 11.2*  HCT 31.4* 33.0*  MCV 90.8  --   MCH 32.1  --   MCHC 35.4  --   RDW 15.8*  --   PLT 257  --    Thyroid  Recent Labs   Lab 09/03/21 1813  TSH 1.870   BNPNo results for input(s): BNP, PROBNP in the last 168 hours.  DDimer No results for input(s): DDIMER in the last 168 hours.   Radiology/Studies:  DG Chest Port 1 View  Result Date: 09/03/2021 CLINICAL DATA:  Chest pain. EXAM: PORTABLE CHEST 1 VIEW COMPARISON:  06/17/2021. FINDINGS: Mild enlargement of the cardiac silhouette. Pulmonary vascular congestion. Low lung volumes  with mild bibasilar opacities. No visible pleural effusions or pneumothorax. No acute osseous abnormality. IMPRESSION: 1. Mild cardiomegaly and pulmonary vascular congestion. 2. Low lung volumes with mild bibasilar opacities, likely atelectasis. Electronically Signed   By: Margaretha Sheffield M.D.   On: 09/03/2021 18:36     Assessment and Plan:   #Chest pain: -Initial troponin is elevated, repeat troponin pending - He does not have any history of traditional exertional chest pain - sx considered exertional since they were worsened by his volume load for PD.  - Currently w/ very little pain after fentanyl and nitro paste - continue current treatment with heparin and nitrates - feel definitive eval is best, MD advise on cardiac CT vs cath   Risk Assessment/Risk Scores:    TIMI Risk Score for Unstable Angina or Non-ST Elevation MI:   The patient's TIMI risk score is 3, which indicates a 13% risk of all cause mortality, new or recurrent myocardial infarction or need for urgent revascularization in the next 14 days.    For questions or updates, please contact [place] Please consult [email or web address] for contact info under     Signed, Rosaria Ferries, PA-C  09/03/2021 8:36 PM   Agree with Rosaria Ferries. Mrs. Bosshart is a 55 y/o M ESRD on PD, nl nuc 2017, HIV (undetectable), HTN, hypothyroid, hep B (treated), OSA not on CPAP, normal EF, HFPEF  who presented with acute chest pain and palpitaitons at rest and troponin elevation. He has no significant ischemic changes. He is having runs  of AT.  He was not having progressive symptoms prior to this event, may be related to his persistent SVT with demand ischemia. Can monitor his troponin and if no significant delta , then this is unlikely ACS. Can increase his BB dose if he continues to have runs of AT.  Recommendations: - metoprolol 25 mg BID - heparin gtt - trend troponin - IHD - K>4, Mg>2 - consult cardiology in the AM  Physical Exam Gen: no acute distress Neuro: alert and oriented CV: r,r,r no murmurs. NO JVD Vasc: 2+ radial pulses Pulm: CLAB Abd: non distended Ext: No LE edema Skin: warm and well perfused Psych: normal mood

## 2021-09-03 NOTE — Progress Notes (Signed)
ANTICOAGULATION CONSULT NOTE - Initial Consult  Pharmacy Consult for heparin Indication: chest pain/ACS  Allergies  Allergen Reactions   Ace Inhibitors Cough and Other (See Comments)   Lisinopril Cough    Patient Measurements: Height: 5' 10"  (177.8 cm) Weight: 89.4 kg (197 lb) IBW/kg (Calculated) : 73 Heparin Dosing Weight: 89.4 kg  Vital Signs: Temp: 98.5 F (36.9 C) (11/08 1721) Temp Source: Oral (11/08 1721) BP: 131/105 (11/08 1824) Pulse Rate: 98 (11/08 1824)  Labs: Recent Labs    09/03/21 1732 09/03/21 1739  HGB 11.1* 11.2*  HCT 31.4* 33.0*  PLT 257  --   CREATININE 16.14* >18.00*  TROPONINIHS 108*  --     CrCl cannot be calculated (This lab value cannot be used to calculate CrCl because it is not a number: >18.00).   Medical History: Past Medical History:  Diagnosis Date   Anemia    Aortic atherosclerosis (HCC)    Arthritis    Asthma    Cancer ([place])    renal cyst   Chronic diastolic CHF (congestive heart failure) ([place]) 01/06/2017   Echo 7/16 [facility] in [place], [name]) Mild AI, mild LAE, mild concentric LVH, EF 55, normal wall motion, mild to moderate MR, mild PI, RVSP 55 // Echo 10/09/16 ([facility]):  Moderate LVH, grade 2 diastolic dysfunction, mild MR, moderate LAE    CKD (chronic kidney disease)    Dialysis Mon Wed Fri   Esophagitis    GERD (gastroesophageal reflux disease)    Gout    no current problems   Heart murmur    never caused any problems   Hepatitis    Hep B   History of nuclear stress test    a. Nuc study 7/16: no scar or ischemia, EF 42 // b. Nuc study 12/17: EF 48, ?small apical ischemia, Low Risk   HIV (human immunodeficiency virus infection) ([place])    HLD (hyperlipidemia)    Hypertension    Hypertensive heart disease with CHF (congestive heart failure) ([place]) 10/09/2016   Hypothyroidism    Mitral regurgitation    OSA (obstructive sleep apnea)    Post-operative nausea and vomiting    1 time as a child, no problems as an  adult   Sleep apnea    uses c-pap   Thyroid disease    Wears glasses    Wears partial dentures     Medications: see MAR  Assessment: 55 yo M with substernal area and L chest pain. Troponin elevated. Heparin consult for [facility]. No AC PTA. CBC is wnl.   Goal of Therapy:  Heparin level 0.3-0.7 units/ml Monitor platelets by anticoagulation protocol: Yes   Plan:  Give 4000 units bolus x 1 Start heparin infusion at 1150 units/hr Check anti-Xa level in 8 hours and daily while on heparin Continue to monitor H&H and platelets  Joetta Manners, PharmD, [facility] Emergency Medicine Clinical Pharmacist ED RPh Phone: [place]: [phone]

## 2021-09-03 NOTE — ED Provider Notes (Signed)
[facility] EMERGENCY DEPARTMENT Provider Note   CSN: [id number] Arrival date & time: 09/03/21  1709     History Chief Complaint  Patient presents with   Chest Pain    Gaelen Brager Lobban is a 55 y.o. male.   Chest Pain Pain location:  Substernal area and L chest Pain quality: pressure   Pain radiates to:  Does not radiate Pain severity:  Moderate Onset quality:  Sudden Duration:  4 hours Timing:  Constant Progression:  Waxing and waning Chronicity:  New Relieved by:  None tried Associated symptoms: fatigue, nausea, shortness of breath and vomiting   Associated symptoms: no back pain, no cough, no diaphoresis, no dizziness, no fever, no numbness, no palpitations and no weakness   Patient with history of HIV (well-controlled on Biktarvy), ESRD (undergoes daily peritoneal dialysis at home), presents for chest pain starting at 2 PM.  Prior to that, he did experience some dizziness this morning that resolved.  He subsequently went to go do his errands.  He went and voted.  He returned home and did experience some chest pain.  He went to initiate a dialysis treatment.  When he placed the dwell, his chest pain worsened.  He then drained did well and came here.  Currently, he has 6/10 severity chest pain.  This is located just left of sternum.  It does not radiate.  At its worst, chest pain was 8/10 in severity.  He has no known history of ACS.  He does see a cardiologist for history of HFpEF, mitral regurgitation, and CAD (diagnosed on coronary CTA last year).  He has recently seen cardiology for work-up in preparation of possible kidney transplant.  He has not taken any aspirin today. HPI: A 54 year old patient with a history of hypertension and hypercholesterolemia presents for evaluation of chest pain. Initial onset of pain was less than one hour ago. The patient's chest pain is described as heaviness/pressure/tightness and is not worse with exertion. The patient complains  of nausea. The patient's chest pain is middle- or left-sided, is not well-localized, is not sharp and does not radiate to the arms/jaw/neck. The patient denies diaphoresis. The patient has no history of stroke, has no history of peripheral artery disease, has not smoked in the past 90 days, denies any history of treated diabetes, has no relevant family history of coronary artery disease (first degree relative at less than age 80) and does not have an elevated BMI (>=30).   Past Medical History:  Diagnosis Date   Anemia    Aortic atherosclerosis (HCC)    Arthritis    Asthma    Cancer ([place])    renal cyst   Chronic diastolic CHF (congestive heart failure) ([place]) 01/06/2017   Echo 7/16 [facility] in [place], [name]) Mild AI, mild LAE, mild concentric LVH, EF 55, normal wall motion, mild to moderate MR, mild PI, RVSP 55 // Echo 10/09/16 ([facility]):  Moderate LVH, grade 2 diastolic dysfunction, mild MR, moderate LAE    CKD (chronic kidney disease)    Dialysis Mon Wed Fri   Esophagitis    GERD (gastroesophageal reflux disease)    Gout    no current problems   Heart murmur    never caused any problems   Hepatitis    Hep B   History of nuclear stress test    a. Nuc study 7/16: no scar or ischemia, EF 42 // b. Nuc study 12/17: EF 48, ?small apical ischemia, Low Risk   HIV (  human immunodeficiency virus infection) ([place])    HLD (hyperlipidemia)    Hypertension    Hypertensive heart disease with CHF (congestive heart failure) ([place]) 10/09/2016   Hypothyroidism    Mitral regurgitation    OSA (obstructive sleep apnea)    Post-operative nausea and vomiting    1 time as a child, no problems as an adult   Sleep apnea    uses c-pap   Thyroid disease    Wears glasses    Wears partial dentures     Patient Active Problem List   Diagnosis Date Noted   NSTEMI (non-ST elevated myocardial infarction) ([place]) 09/03/2021   Essential hypertension 09/03/2021   Renal cell carcinoma ([place]) 06/27/2021   SVT  (supraventricular tachycardia) ([place]) 01/18/2021   Lower abdominal pain 06/26/2020   Pulsatile tinnitus of right ear 11/21/2019   Kidney failure 38/25/0539   Acute diastolic CHF (congestive heart failure) ([place]) 11/21/2019   Chills (without fever) 11/07/2019   Hypercalcemia 10/17/2019   Dependence on renal dialysis ([place]) 06/06/2019   Coagulation defect, unspecified ([place]) 05/23/2019   Iron deficiency anemia, unspecified 05/16/2019   Anemia in other chronic diseases classified elsewhere 05/13/2019   Arteriovenous fistula, acquired ([place]) 05/13/2019   Body mass index (BMI) 28.0-28.9, adult 05/13/2019   Crohn's disease of small intestine without complications ([place]) 76/73/4193   Encounter for screening for respiratory tuberculosis 05/13/2019   Gout, unspecified 05/13/2019   Nausea 05/13/2019   Other fatigue 05/13/2019   Secondary hyperparathyroidism of renal origin ([place]) 05/13/2019   End stage renal disease ([place]) 05/13/2019   Chronic diastolic (congestive) heart failure ([place]) 05/13/2019   Chronic kidney disease, unspecified 05/13/2019   Obstructive sleep apnea 05/13/2019   Discomfort of right ear 02/18/2019   Hypokalemia 06/13/2018   Abnormal CT scan, small bowel    Perennial allergic rhinitis with a predominant nonallergic component 02/09/2018   Allergic conjunctivitis 02/09/2018   Mild intermittent asthma 02/09/2018   Atherosclerosis 12/09/2017   Hypothyroidism 11/19/2017   Mixed hyperlipidemia 10/13/2017   Personal history of gout 10/13/2017   S/P cholecystectomy 10/13/2017   Congestive heart failure with LV diastolic dysfunction, NYHA class 1 ([place]) 10/13/2017   Hypothyroidism (acquired) 10/13/2017   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   SOB (shortness of breath) 06/07/2017   Arthritis, gouty 01/27/2017   Chronic diastolic CHF (congestive heart failure) ([place]) 01/06/2017   OSA (obstructive sleep apnea) 01/06/2017   Abnormal electrocardiogram (ECG) (EKG) 10/31/2016   Chest pain  10/09/2016   HIV disease ([place]) 10/09/2016   Hypertensive heart disease with CHF (congestive heart failure) ([place]) 10/09/2016   Left-sided low back pain without sciatica 05/29/2015   NYHA class 2 heart failure with preserved ejection fraction ([place]) 05/15/2015   PAH (pulmonary artery hypertension) ([place]) 05/08/2015   Severe mitral regurgitation 05/04/2015   Prolonged Q-T interval on ECG 05/04/2015    Past Surgical History:  Procedure Laterality Date   AV FISTULA PLACEMENT Left 07/02/2018   Procedure: Creation of Left arm BRACHIOBASILIC ARTERIOVENOUS  FISTULA;  Surgeon: Marty Heck, MD;  Location: [place];  Service: Vascular;  Laterality: Left;   [place] Left 08/27/2018   Procedure: Left arm BRACHIOBASILIC VEIN TRANSPOSITION SECOND STAGE;  Surgeon: Marty Heck, MD;  Location: [facility] OR;  Service: Vascular;  Laterality: Left;   BUBBLE STUDY  09/03/2020   Procedure: BUBBLE STUDY;  Surgeon: Fay Records, MD;  Location: [place];  Service: Cardiovascular;;   CAPD INSERTION N/A 05/30/2021   Procedure: LAPAROSCOPIC INSERTION CONTINUOUS AMBULATORY PERITONEAL  DIALYSIS  (CAPD) CATHETER;  Surgeon: [name], MD;  Location: [facility] OR;  Service: Vascular;  Laterality: N/A;   CHOLECYSTECTOMY     COLONOSCOPY W/ BIOPSIES AND POLYPECTOMY     GIVENS CAPSULE STUDY N/A 03/01/2018   Procedure: GIVENS CAPSULE STUDY;  Surgeon: Jerene Bears, MD;  Location: [place];  Service: Gastroenterology;  Laterality: N/A;   HERNIA REPAIR     As baby   MULTIPLE TOOTH EXTRACTIONS     NEPHRECTOMY Left 02/18/2021   NOSE SURGERY     RENAL BIOPSY     TEE WITHOUT CARDIOVERSION N/A 09/03/2020   Procedure: TRANSESOPHAGEAL ECHOCARDIOGRAM (TEE);  Surgeon: Fay Records, MD;  Location: [facility] ENDOSCOPY;  Service: Cardiovascular;  Laterality: N/A;   UPPER GI ENDOSCOPY  04/05/2021       Family History  Problem Relation Age of Onset   Heart failure Mother    Heart attack Maternal Grandmother  14   Hypertension Sister    Multiple sclerosis Sister    Hypertension Brother    Hypertension Sister    Scoliosis Other    Allergic rhinitis Neg Hx    Angioedema Neg Hx    Asthma Neg Hx    Eczema Neg Hx    Immunodeficiency Neg Hx    Urticaria Neg Hx    Colon cancer Neg Hx    Pancreatic cancer Neg Hx    Esophageal cancer Neg Hx     Social History   Tobacco Use   Smoking status: Former    Packs/day: 1.00    Types: Cigarettes    Quit date: 2000    Years since quitting: 22.8   Smokeless tobacco: Never  Vaping Use   Vaping Use: Never used  Substance Use Topics   Alcohol use: Yes    Comment: occasionally liquor   Drug use: No    Home Medications Prior to Admission medications   Medication Sig Start Date End Date Taking? Authorizing Provider  ACETAMINOPHEN EXTRA STRENGTH 500 MG tablet Take 500 mg by mouth every 6 (six) hours as needed (pain.). 02/20/21   [provider]  albuterol (PROAIR HFA) 108 (90 Base) MCG/ACT inhaler INHALE 2 PUFFS INTO THE LUNGS EVERY 4 HOURS AS NEEDED FOR WHEEZING OR SHORTNESS OF BREATH 08/07/21   Valentina Shaggy, MD  allopurinol (ZYLOPRIM) 100 MG tablet TAKE 1 TABLET(100 MG) BY MOUTH TWICE DAILY FOR 60 DOSES 09/03/21   [name], FNP  amLODipine (NORVASC) 10 MG tablet Take 1 tablet (10 mg total) by mouth daily. Patient taking differently: Take 10 mg by mouth daily at 12 noon. At midday 10/31/16   [name], FNP  aspirin EC 81 MG tablet Take 1 tablet (81 mg total) by mouth daily. Swallow whole. Patient taking differently: Take 81 mg by mouth in the morning. Swallow whole. 03/06/21   Richardson Dopp T, PA-C  azelastine (ASTELIN) 0.1 % nasal spray PLACE 2 SPRAYS INTO EACH NOSTRIL TWICE DAILY AS NEEDED FOR RHINITIS AS DIRECTED Patient taking differently: Place 2 sprays into both nostrils 2 (two) times daily as needed for allergies or rhinitis. 06/05/21   Valentina Shaggy, MD  BIKTARVY [phone] MG TABS tablet TAKE 1 TABLET BY  MOUTH DAILY 07/08/21   Campbell Riches, MD  calcitRIOL (ROCALTROL) 0.5 MCG capsule Take 0.5 mcg by mouth every Monday, Wednesday, and Friday. 02/06/21   [provider]  carvedilol (COREG) 25 MG tablet TAKE 1 TABLET BY MOUTH TWICE DAILY WITH FOOD Patient taking differently: Take 25  mg by mouth 2 (two) times daily with a meal. 12/31/20   [name], MD  chlorproMAZINE (THORAZINE) 10 MG tablet Take 1 tablet (10 mg total) by mouth 3 (three) times daily as needed for hiccoughs. Patient taking differently: Take 10 mg by mouth 3 (three) times daily. 02/26/21   [name], FNP  clobetasol (OLUX) 0.05 % topical foam Apply topically 2 (two) times daily. 08/21/21   Lavonna Monarch, MD  clonazePAM (KLONOPIN) 0.5 MG tablet Take 1 tablet (0.5 mg total) by mouth at bedtime. 01/02/21   Laurin Coder, MD  clotrimazole-betamethasone (LOTRISONE) cream Apply topically 2 (two) times daily. X 3-5 days. IF symptoms continue, call office Patient taking differently: Apply 1 application topically 2 (two) times daily as needed (rectal discomfort). 05/16/21   Pyrtle, Lajuan Lines, MD  docusate sodium (COLACE) 100 MG capsule Take 100 mg by mouth 2 (two) times daily. 02/20/21   [provider]  fluticasone (FLOVENT HFA) 110 MCG/ACT inhaler INHALE 2 PUFFS INTO THE LUNGS TWICE DAILY 06/24/21   Valentina Shaggy, MD  gabapentin (NEURONTIN) 300 MG capsule TAKE 1 CAPSULE(300 MG) BY MOUTH THREE TIMES DAILY Patient taking differently: Take 300 mg by mouth 3 (three) times daily. 10/09/20   [name], FNP  gentamicin cream (GARAMYCIN) 0.1 % Apply 1 application topically See admin instructions. Apply when catheter is flushed and cleaned 05/28/21   [provider]  hydrALAZINE (APRESOLINE) 100 MG tablet Take 1 tablet (100 mg total) by mouth 3 (three) times daily. 04/02/21 04/02/22  [name], MD  hyoscyamine (LEVSIN SL) 0.125 MG SL tablet Place 1-2 tablets (0.125-0.25 mg total) under the tongue every  8 (eight) hours as needed (lower abdominal pain, spasm). 08/05/19   Pyrtle, Lajuan Lines, MD  ipratropium (ATROVENT) 0.06 % nasal spray USE 2 SPRAYS IN EACH NOSTRIL 2 TO 3 TIMES DAILY AS NEEDED Patient taking differently: Place 2 sprays into both nostrils 3 (three) times daily as needed (allergies). 09/03/20   Valentina Shaggy, MD  iron sucrose (VENOFER) 20 MG/ML injection Inject into the vein. 07/12/20   [provider]  lactulose (CHRONULAC) 10 GM/15ML solution Take 20 g by mouth daily as needed for moderate constipation.  07/08/20   [provider]  levothyroxine (SYNTHROID) 25 MCG tablet TAKE 1 TABLET BY MOUTH DAILY BEFORE BREAKFAST Patient taking differently: Take 25 mcg by mouth daily before breakfast. 02/05/21   [name], FNP  lidocaine-prilocaine (EMLA) cream Apply 1 application topically See admin instructions. Apply to access site 1-2 hours before dialysis 06/01/19   [provider]  lubiprostone (AMITIZA) 24 MCG capsule TAKE 1 CAPSULE(24 MCG) BY MOUTH TWICE DAILY WITH A MEAL Patient taking differently: Take 24 mcg by mouth in the morning and at bedtime. 05/21/21   Pyrtle, Lajuan Lines, MD  methocarbamol (ROBAXIN) 500 MG tablet Take 1 tablet (500 mg total) by mouth every 8 (eight) hours as needed for muscle spasms. Patient taking differently: Take 500 mg by mouth at bedtime. 02/11/21   Maudie Flakes, MD  Methoxy PEG-Epoetin Beta (MIRCERA IJ) Inject into the skin. 11/08/20   [provider]  multivitamin (RENA-VIT) TABS tablet Take 1 tablet by mouth at bedtime. 01/10/21   [provider]  mupirocin ointment (BACTROBAN) 2 % Apply 1 application topically daily as needed (apply to port after dialysis).  04/24/20   [provider]  Olopatadine HCl (PAZEO) 0.7 % SOLN Place 1 drop into both eyes daily. Patient taking differently: Place 1 drop  into both eyes daily as needed (itchy eyes). 05/24/20   Valentina Shaggy, MD  oxyCODONE-acetaminophen  (PERCOCET) 10-325 MG tablet Take 1 tablet by mouth every 6 (six) hours as needed for pain. 05/30/21 05/30/22  Setzer, Edman Circle, PA-C  pantoprazole (PROTONIX) 40 MG tablet TAKE 1 TABLET(40 MG) BY MOUTH DAILY 08/07/21   Esterwood, Amy S, PA-C  predniSONE (DELTASONE) 10 MG tablet Take 1 tablet (10 mg total) by mouth daily with breakfast. 08/06/21   Suzan Slick, NP  rOPINIRole (REQUIP) 0.5 MG tablet Take 0.5 mg by mouth at bedtime. 02/05/21   [provider]  rOPINIRole (REQUIP) 1 MG tablet Take 1 tablet (1 mg total) by mouth at bedtime. 08/12/21   Olalere, Cicero Duck A, MD  rosuvastatin (CRESTOR) 10 MG tablet Take 1 tablet (10 mg total) by mouth daily. Patient taking differently: Take 10 mg by mouth in the morning. 03/06/21 06/17/21  Richardson Dopp T, PA-C  senna (SENOKOT) 8.6 MG TABS tablet Take 2 tablets by mouth every evening. 02/20/21   [provider]  sevelamer carbonate (RENVELA) 800 MG tablet Take 1,600 mg by mouth 3 (three) times daily with meals. 11/09/20   [provider]  Spacer/Aero-Holding Josiah Lobo DEVI 1 Device by Does not apply route 2 (two) times a day. 03/01/19   Bobbitt, Sedalia Muta, MD  Vitamin D, Ergocalciferol, (DRISDOL) 1.25 MG (50000 UNIT) CAPS capsule TAKE 1 CAPSULE BY MOUTH EVERY 7 DAYS Patient taking differently: Take 50,000 Units by mouth every Tuesday. At midday 02/05/21   [name], FNP    Allergies    Ace inhibitors and Lisinopril  Review of Systems   Review of Systems  Constitutional:  Positive for fatigue. Negative for activity change, chills, diaphoresis and fever.  HENT:  Negative for congestion, ear pain and sore throat.   Eyes:  Negative for pain and visual disturbance.  Respiratory:  Positive for shortness of breath. Negative for cough, chest tightness and wheezing.   Cardiovascular:  Positive for chest pain. Negative for palpitations.  Gastrointestinal:  Positive for nausea and vomiting. Negative for constipation and diarrhea.   Genitourinary:  Negative for dysuria, flank pain and hematuria.  Musculoskeletal:  Negative for arthralgias, back pain, myalgias and neck pain.  Skin:  Negative for color change and rash.  Neurological:  Negative for dizziness, seizures, syncope, weakness, light-headedness and numbness.  Hematological:  Does not bruise/bleed easily.  All other systems reviewed and are negative.  Physical Exam Updated Vital Signs BP 130/86 (BP Location: Right Arm)   Pulse 79   Temp 98.1 F (36.7 C) (Oral)   Resp 18   Ht 5' 10"  (1.778 m)   Wt 93.7 kg   SpO2 97%   BMI 29.63 kg/m   Physical Exam Vitals and nursing note reviewed.  Constitutional:      General: He is not in acute distress.    Appearance: He is well-developed. He is ill-appearing. He is not toxic-appearing or diaphoretic.  HENT:     Head: Normocephalic and atraumatic.  Eyes:     Conjunctiva/sclera: Conjunctivae normal.  Cardiovascular:     Rate and Rhythm: Normal rate and regular rhythm.     Heart sounds: No murmur heard. Pulmonary:     Effort: Tachypnea present. No respiratory distress.     Breath sounds: Normal breath sounds. No decreased breath sounds, wheezing or rales.     Comments: Increased work of breathing Chest:     Chest wall: No tenderness.  Abdominal:  Palpations: Abdomen is soft.     Tenderness: There is no abdominal tenderness.  Musculoskeletal:     Cervical back: Normal range of motion and neck supple.  Skin:    General: Skin is warm and dry.  Neurological:     General: No focal deficit present.     Mental Status: He is alert and oriented to person, place, and time.     Cranial Nerves: No cranial nerve deficit.     Motor: No weakness.  Psychiatric:        Mood and Affect: Mood normal.        Behavior: Behavior normal.    ED Results / Procedures / Treatments   Labs (all labs ordered are listed, but only abnormal results are displayed) Labs Reviewed  CBC - Abnormal; Notable for the following  components:      Result Value   RBC 3.46 (*)    Hemoglobin 11.1 (*)    HCT 31.4 (*)    RDW 15.8 (*)    All other components within normal limits  COMPREHENSIVE METABOLIC PANEL - Abnormal; Notable for the following components:   Potassium 3.4 (*)    Chloride 96 (*)    BUN 67 (*)    Creatinine, Ser 16.14 (*)    GFR, Estimated 3 (*)    Anion gap 20 (*)    All other components within normal limits  I-STAT CHEM 8, ED - Abnormal; Notable for the following components:   Potassium 3.3 (*)    BUN 60 (*)    Creatinine, Ser >18.00 (*)    Calcium, Ion 1.04 (*)    Hemoglobin 11.2 (*)    HCT 33.0 (*)    All other components within normal limits  TROPONIN I (HIGH SENSITIVITY) - Abnormal; Notable for the following components:   Troponin I (High Sensitivity) 108 (*)    All other components within normal limits  TROPONIN I (HIGH SENSITIVITY) - Abnormal; Notable for the following components:   Troponin I (High Sensitivity) 109 (*)    All other components within normal limits  RESP PANEL BY RT-PCR (FLU A&B, COVID) ARPGX2  TSH  HEPARIN LEVEL (UNFRACTIONATED)  CBC  BASIC METABOLIC PANEL  LIPID PANEL    EKG EKG Interpretation  Date/Time:  Tuesday September 03 2021 18:51:36 EST Ventricular Rate:  92 PR Interval:  161 QRS Duration: 86 QT Interval:  371 QTC Calculation: 459 R Axis:   50 Text Interpretation: Sinus rhythm Minimal ST elevation, anterior leads When compared with ECG of EARLIER SAME DATE HEART RATE has decreased QT has shortened Confirmed by Delora Fuel ([id number]) on 09/04/2021 12:10:53 AM  Radiology DG Chest Port 1 View  Result Date: 09/03/2021 CLINICAL DATA:  Chest pain. EXAM: PORTABLE CHEST 1 VIEW COMPARISON:  06/17/2021. FINDINGS: Mild enlargement of the cardiac silhouette. Pulmonary vascular congestion. Low lung volumes with mild bibasilar opacities. No visible pleural effusions or pneumothorax. No acute osseous abnormality. IMPRESSION: 1. Mild cardiomegaly and pulmonary  vascular congestion. 2. Low lung volumes with mild bibasilar opacities, likely atelectasis. Electronically Signed   By: Margaretha Sheffield M.D.   On: 09/03/2021 18:36    Procedures Procedures   Medications Ordered in ED Medications  heparin ADULT infusion 100 units/mL (25000 units/214m) (1,150 Units/hr Intravenous New Bag/Given 09/03/21 2029)  metoprolol tartrate (LOPRESSOR) tablet 25 mg (25 mg Oral Given 09/03/21 2209)  nitroGLYCERIN (NITROGLYN) 2 % ointment 1 inch (1 inch Topical Given 09/04/21 0107)  aspirin EC tablet 81 mg (has no administration in time  range)  bictegravir-emtricitabine-tenofovir AF (BIKTARVY) 50-200-25 MG per tablet 1 tablet (has no administration in time range)  amLODipine (NORVASC) tablet 10 mg (has no administration in time range)  hydrALAZINE (APRESOLINE) tablet 100 mg (100 mg Oral Given 09/03/21 2306)  levothyroxine (SYNTHROID) tablet 25 mcg (has no administration in time range)  calcitRIOL (ROCALTROL) capsule 0.5 mcg (has no administration in time range)  sevelamer carbonate (RENVELA) tablet 1,600 mg (has no administration in time range)  multivitamin (RENA-VIT) tablet 1 tablet (1 tablet Oral Given 09/03/21 2307)  sodium chloride flush (NS) 0.9 % injection 3 mL (0 mLs Intravenous Hold 09/03/21 2307)  sodium chloride flush (NS) 0.9 % injection 3 mL (has no administration in time range)  0.9 %  sodium chloride infusion (has no administration in time range)  acetaminophen (TYLENOL) tablet 650 mg (has no administration in time range)    Or  acetaminophen (TYLENOL) suppository 650 mg (has no administration in time range)  atorvastatin (LIPITOR) tablet 40 mg (40 mg Oral Given 09/03/21 2209)  albuterol (PROVENTIL) (2.5 MG/3ML) 0.083% nebulizer solution 2.5 mg (has no administration in time range)  aspirin chewable tablet 324 mg (324 mg Oral Given 09/03/21 1857)  fentaNYL (SUBLIMAZE) injection 50 mcg (50 mcg Intravenous Given 09/03/21 1857)  metoCLOPramide (REGLAN) injection 10  mg (10 mg Intravenous Given 09/03/21 1857)  nitroGLYCERIN (NITROGLYN) 2 % ointment 1 inch (1 inch Topical Given 09/03/21 1857)  heparin bolus via infusion 4,000 Units (4,000 Units Intravenous Bolus from Bag 09/03/21 2031)    ED Course  I have reviewed the triage vital signs and the nursing notes.  Pertinent labs & imaging results that were available during my care of the patient were reviewed by me and considered in my medical decision making (see chart for details).    MDM Rules/Calculators/A&P HEAR Score: 4                       CRITICAL CARE Performed by: Godfrey Pick   Total critical care time: 40 minutes  Critical care time was exclusive of separately billable procedures and treating other patients.  Critical care was necessary to treat or prevent imminent or life-threatening deterioration.  Critical care was time spent personally by me on the following activities: development of treatment plan with patient and/or surrogate as well as nursing, discussions with consultants, evaluation of patient's response to treatment, examination of patient, obtaining history from patient or surrogate, ordering and performing treatments and interventions, ordering and review of laboratory studies, ordering and review of radiographic studies, pulse oximetry and re-evaluation of patient's condition.   Patient is a 55 year old male who presents for active chest pain.  Onset was 4 hours prior to arrival.  EKG does not show evidence of ischemia.  Shortly after arrival in the ED, patient had increase in heart rate in the range of 150s.  This does appear to be atrial tachycardia.  Heart rate would subsequently normalized without intervention.  Vital signs are otherwise notable for tachypnea.  He is afebrile on arrival and SPO2 is normal on room air.  Work-up breathing does appear increased.  Lungs are clear to auscultation.  Given concern for ACS, patient was given 324 of ASA.  Active chest pain was treated with  fentanyl and Nitropaste.  He has a history of long QTc syndrome.  He did endorse nausea and Reglan was provided.  On reassessment, patient reported resolution of chest pain.  Heart rate remained normal.  Initial troponin was elevated at 108.  Patient does have ESRD and does have elevated troponin at baseline.  Per chart review, it does appear that his baseline troponin is in the range of 60-70.  Heparin was initiated.  I did consult cardiology.  Cardiology agrees with heparinization until reevaluation in the morning.  They did request hospitalist admission, given the patient's other comorbidities.  Patient was admitted to hospitalist for ongoing management.  Final Clinical Impression(s) / ED Diagnoses Final diagnoses:  Chest pain, unspecified type    Rx / DC Orders ED Discharge Orders     None        Godfrey Pick, MD 09/04/21 0136

## 2021-09-03 NOTE — ED Triage Notes (Signed)
Patient reports left sided chest pain onset of 2pm today with shortness of breath and nausea.

## 2021-09-03 NOTE — H&P (Signed)
History and Physical    Artis Beggs Betzold ZJI:[id number] DOB: [birth year] DOA: 09/03/2021  PCP: [name], FNP   Patient coming from: Home  Chief Complaint: Chest pain  HPI: Albert Eaton is a 55 y.o. male with medical history significant for HFpEF, ESRD on PD, nl nuc 2017, HIV (undetectable), HTN, hypothyroid, hep B (treated), OSA (uses CPAP intermittently) who presents for evaluation of chest pain.  He reports that this afternoon he started his peritoneal dialysis and shortly thereafter developed chest pain while at rest.  He reports the chest pain was in the substernal region and felt like a pressure that was a 7 out of 10 at its worst.  He states the pain did not radiate but was associated with shortness of breath and nausea.  He did not have any vomiting.  He denies any fever or chills.  He was given nitroglycerin sublingual and aspirin by EMS and chest pain improved.  When he arrived in the emergency room nitroglycerin paste was placed to chest wall.  He reports chest pressure is now a 1-2 out of 10.   ED Course: Mr. Woodin has been hemodynamically stable in the emergency room.  He has been evaluated by cardiology.  Initial troponin was elevated at 108.  Started on heparin infusion for ACS.  Lab work was unremarkable CBC.  Initial troponin 108 and repeat troponin if you are later 109.  Sodium 138 potassium 3.4 chloride 96 bicarb 22 creatinine 16.14 BUN 67 calcium 10.3 albumin 4.2 alkaline phosphatase 73 AST 22 ALT 15, troponin 1.870.  COVID swab pending.  Hospitalist service been asked to admit patient for further management and evaluation  Review of Systems:  General: Denies fever, chills, weight loss, night sweats.  Denies dizziness.  Denies change in appetite HENT: Denies head trauma, headache, denies change in hearing, tinnitus.  Denies nasal congestion or bleeding.  Denies sore throat.  Denies difficulty swallowing Eyes: Denies blurry vision, pain in eye, drainage.  Denies  discoloration of eyes. Neck: Denies pain.  Denies swelling.  Denies pain with movement. Cardiovascular: Reports chest pain, Denies palpitations.  Denies edema.  Denies orthopnea Respiratory: Reports shortness of breath.  Denies wheezing.  Denies sputum production Gastrointestinal: Denies abdominal pain, swelling.  Denies nausea, vomiting, diarrhea.  Denies melena.  Denies hematemesis. Musculoskeletal: Denies limitation of movement.  Denies deformity or swelling. Denies arthralgias or myalgias. Genitourinary: Denies pelvic pain.  Denies urinary frequency or hesitancy.  Denies dysuria.  Skin: Denies rash.  Denies petechiae, purpura, ecchymosis. Neurological: Denies syncope.  Denies seizure activity. Denies paresthesia.  Denies slurred speech, drooping face.  Denies visual change. Psychiatric: Denies depression, anxiety.  Denies hallucinations.  Past Medical History:  Diagnosis Date   Anemia    Aortic atherosclerosis (HCC)    Arthritis    Asthma    Cancer ([place])    renal cyst   Chronic diastolic CHF (congestive heart failure) ([place]) 01/06/2017   Echo 7/16 [facility] in [place], [name]) Mild AI, mild LAE, mild concentric LVH, EF 55, normal wall motion, mild to moderate MR, mild PI, RVSP 55 // Echo 10/09/16 ([facility]):  Moderate LVH, grade 2 diastolic dysfunction, mild MR, moderate LAE    CKD (chronic kidney disease)    Dialysis Mon Wed Fri   Esophagitis    GERD (gastroesophageal reflux disease)    Gout    no current problems   Heart murmur    never caused any problems   Hepatitis    Hep B  History of nuclear stress test    a. Nuc study 7/16: no scar or ischemia, EF 42 // b. Nuc study 12/17: EF 48, ?small apical ischemia, Low Risk   HIV (human immunodeficiency virus infection) ([place])    HLD (hyperlipidemia)    Hypertension    Hypertensive heart disease with CHF (congestive heart failure) ([place]) 10/09/2016   Hypothyroidism    Mitral regurgitation    OSA (obstructive sleep apnea)     Post-operative nausea and vomiting    1 time as a child, no problems as an adult   Sleep apnea    uses c-pap   Thyroid disease    Wears glasses    Wears partial dentures     Past Surgical History:  Procedure Laterality Date   AV FISTULA PLACEMENT Left 07/02/2018   Procedure: Creation of Left arm BRACHIOBASILIC ARTERIOVENOUS  FISTULA;  Surgeon: Marty Heck, MD;  Location: [facility];  Service: Vascular;  Laterality: Left;   [place] Left 08/27/2018   Procedure: Left arm BRACHIOBASILIC VEIN TRANSPOSITION SECOND STAGE;  Surgeon: Marty Heck, MD;  Location: [facility];  Service: Vascular;  Laterality: Left;   BUBBLE STUDY  09/03/2020   Procedure: BUBBLE STUDY;  Surgeon: Fay Records, MD;  Location: [facility];  Service: Cardiovascular;;   CAPD INSERTION N/A 05/30/2021   Procedure: LAPAROSCOPIC INSERTION CONTINUOUS AMBULATORY PERITONEAL DIALYSIS  (CAPD) CATHETER;  Surgeon: [name], MD;  Location[facility];  Service: Vascular;  Laterality: N/A;   CHOLECYSTECTOMY     COLONOSCOPY W/ BIOPSIES AND POLYPECTOMY     GIVENS CAPSULE STUDY N/A 03/01/2018   Procedure: GIVENS CAPSULE STUDY;  Surgeon: Jerene Bears, MD;  Location: [facility];  Service: Gastroenterology;  Laterality: N/A;   HERNIA REPAIR     As baby   MULTIPLE TOOTH EXTRACTIONS     NEPHRECTOMY Left 02/18/2021   NOSE SURGERY     RENAL BIOPSY     TEE WITHOUT CARDIOVERSION N/A 09/03/2020   Procedure: TRANSESOPHAGEAL ECHOCARDIOGRAM (TEE);  Surgeon: Fay Records, MD;  Location: [facility] ENDOSCOPY;  Service: Cardiovascular;  Laterality: N/A;   UPPER GI ENDOSCOPY  04/05/2021    Social History  reports that he quit smoking about 22 years ago. His smoking use included cigarettes. He smoked an average of 1 pack per day. He has never used smokeless tobacco. He reports current alcohol use. He reports that he does not use drugs.  Allergies  Allergen Reactions   Ace Inhibitors Cough and Other (See Comments)    Lisinopril Cough    Family History  Problem Relation Age of Onset   Heart failure Mother    Heart attack Maternal Grandmother 51   Hypertension Sister    Multiple sclerosis Sister    Hypertension Brother    Hypertension Sister    Scoliosis Other    Allergic rhinitis Neg Hx    Angioedema Neg Hx    Asthma Neg Hx    Eczema Neg Hx    Immunodeficiency Neg Hx    Urticaria Neg Hx    Colon cancer Neg Hx    Pancreatic cancer Neg Hx    Esophageal cancer Neg Hx      Prior to Admission medications   Medication Sig Start Date End Date Taking? Authorizing Provider  ACETAMINOPHEN EXTRA STRENGTH 500 MG tablet Take 500 mg by mouth every 6 (six) hours as needed (pain.). 02/20/21   [provider]  albuterol (PROAIR HFA) 108 (90 Base) MCG/ACT inhaler INHALE 2 PUFFS  INTO THE LUNGS EVERY 4 HOURS AS NEEDED FOR WHEEZING OR SHORTNESS OF BREATH 08/07/21   Valentina Shaggy, MD  allopurinol (ZYLOPRIM) 100 MG tablet TAKE 1 TABLET(100 MG) BY MOUTH TWICE DAILY FOR 60 DOSES 09/03/21   [name], FNP  amLODipine (NORVASC) 10 MG tablet Take 1 tablet (10 mg total) by mouth daily. Patient taking differently: Take 10 mg by mouth daily at 12 noon. At midday 10/31/16   [name], FNP  aspirin EC 81 MG tablet Take 1 tablet (81 mg total) by mouth daily. Swallow whole. Patient taking differently: Take 81 mg by mouth in the morning. Swallow whole. 03/06/21   Richardson Dopp T, PA-C  azelastine (ASTELIN) 0.1 % nasal spray PLACE 2 SPRAYS INTO EACH NOSTRIL TWICE DAILY AS NEEDED FOR RHINITIS AS DIRECTED Patient taking differently: Place 2 sprays into both nostrils 2 (two) times daily as needed for allergies or rhinitis. 06/05/21   Valentina Shaggy, MD  BIKTARVY [phone] MG TABS tablet TAKE 1 TABLET BY MOUTH DAILY 07/08/21   Campbell Riches, MD  calcitRIOL (ROCALTROL) 0.5 MCG capsule Take 0.5 mcg by mouth every Monday, Wednesday, and Friday. 02/06/21   [provider]  carvedilol (COREG)  25 MG tablet TAKE 1 TABLET BY MOUTH TWICE DAILY WITH FOOD Patient taking differently: Take 25 mg by mouth 2 (two) times daily with a meal. 12/31/20   [name], MD  chlorproMAZINE (THORAZINE) 10 MG tablet Take 1 tablet (10 mg total) by mouth 3 (three) times daily as needed for hiccoughs. Patient taking differently: Take 10 mg by mouth 3 (three) times daily. 02/26/21   [name], FNP  clobetasol (OLUX) 0.05 % topical foam Apply topically 2 (two) times daily. 08/21/21   Lavonna Monarch, MD  clonazePAM (KLONOPIN) 0.5 MG tablet Take 1 tablet (0.5 mg total) by mouth at bedtime. 01/02/21   Laurin Coder, MD  clotrimazole-betamethasone (LOTRISONE) cream Apply topically 2 (two) times daily. X 3-5 days. IF symptoms continue, call office Patient taking differently: Apply 1 application topically 2 (two) times daily as needed (rectal discomfort). 05/16/21   Pyrtle, Lajuan Lines, MD  docusate sodium (COLACE) 100 MG capsule Take 100 mg by mouth 2 (two) times daily. 02/20/21   [provider]  fluticasone (FLOVENT HFA) 110 MCG/ACT inhaler INHALE 2 PUFFS INTO THE LUNGS TWICE DAILY 06/24/21   Valentina Shaggy, MD  gabapentin (NEURONTIN) 300 MG capsule TAKE 1 CAPSULE(300 MG) BY MOUTH THREE TIMES DAILY Patient taking differently: Take 300 mg by mouth 3 (three) times daily. 10/09/20   [name], FNP  gentamicin cream (GARAMYCIN) 0.1 % Apply 1 application topically See admin instructions. Apply when catheter is flushed and cleaned 05/28/21   [provider]  hydrALAZINE (APRESOLINE) 100 MG tablet Take 1 tablet (100 mg total) by mouth 3 (three) times daily. 04/02/21 04/02/22  [name], MD  hyoscyamine (LEVSIN SL) 0.125 MG SL tablet Place 1-2 tablets (0.125-0.25 mg total) under the tongue every 8 (eight) hours as needed (lower abdominal pain, spasm). 08/05/19   Pyrtle, Lajuan Lines, MD  ipratropium (ATROVENT) 0.06 % nasal spray USE 2 SPRAYS IN EACH NOSTRIL 2 TO 3 TIMES DAILY AS NEEDED Patient  taking differently: Place 2 sprays into both nostrils 3 (three) times daily as needed (allergies). 09/03/20   Valentina Shaggy, MD  iron sucrose (VENOFER) 20 MG/ML injection Inject into the vein. 07/12/20   [provider]  lactulose (CHRONULAC) 10 GM/15ML solution Take 20 g by mouth  daily as needed for moderate constipation.  07/08/20   [provider]  levothyroxine (SYNTHROID) 25 MCG tablet TAKE 1 TABLET BY MOUTH DAILY BEFORE BREAKFAST Patient taking differently: Take 25 mcg by mouth daily before breakfast. 02/05/21   [name], FNP  lidocaine-prilocaine (EMLA) cream Apply 1 application topically See admin instructions. Apply to access site 1-2 hours before dialysis 06/01/19   [provider]  lubiprostone (AMITIZA) 24 MCG capsule TAKE 1 CAPSULE(24 MCG) BY MOUTH TWICE DAILY WITH A MEAL Patient taking differently: Take 24 mcg by mouth in the morning and at bedtime. 05/21/21   Pyrtle, Lajuan Lines, MD  methocarbamol (ROBAXIN) 500 MG tablet Take 1 tablet (500 mg total) by mouth every 8 (eight) hours as needed for muscle spasms. Patient taking differently: Take 500 mg by mouth at bedtime. 02/11/21   Maudie Flakes, MD  Methoxy PEG-Epoetin Beta (MIRCERA IJ) Inject into the skin. 11/08/20   [provider]  multivitamin (RENA-VIT) TABS tablet Take 1 tablet by mouth at bedtime. 01/10/21   [provider]  mupirocin ointment (BACTROBAN) 2 % Apply 1 application topically daily as needed (apply to port after dialysis).  04/24/20   [provider]  Olopatadine HCl (PAZEO) 0.7 % SOLN Place 1 drop into both eyes daily. Patient taking differently: Place 1 drop into both eyes daily as needed (itchy eyes). 05/24/20   Valentina Shaggy, MD  oxyCODONE-acetaminophen (PERCOCET) 10-325 MG tablet Take 1 tablet by mouth every 6 (six) hours as needed for pain. 05/30/21 05/30/22  Setzer, Edman Circle, PA-C  pantoprazole (PROTONIX) 40 MG tablet TAKE 1 TABLET(40 MG) BY MOUTH  DAILY 08/07/21   Esterwood, Amy S, PA-C  predniSONE (DELTASONE) 10 MG tablet Take 1 tablet (10 mg total) by mouth daily with breakfast. 08/06/21   Suzan Slick, NP  rOPINIRole (REQUIP) 0.5 MG tablet Take 0.5 mg by mouth at bedtime. 02/05/21   [provider]  rOPINIRole (REQUIP) 1 MG tablet Take 1 tablet (1 mg total) by mouth at bedtime. 08/12/21   Olalere, Cicero Duck A, MD  rosuvastatin (CRESTOR) 10 MG tablet Take 1 tablet (10 mg total) by mouth daily. Patient taking differently: Take 10 mg by mouth in the morning. 03/06/21 06/17/21  Richardson Dopp T, PA-C  senna (SENOKOT) 8.6 MG TABS tablet Take 2 tablets by mouth every evening. 02/20/21   [provider]  sevelamer carbonate (RENVELA) 800 MG tablet Take 1,600 mg by mouth 3 (three) times daily with meals. 11/09/20   [provider]  Spacer/Aero-Holding Josiah Lobo DEVI 1 Device by Does not apply route 2 (two) times a day. 03/01/19   Bobbitt, Sedalia Muta, MD  Vitamin D, Ergocalciferol, (DRISDOL) 1.25 MG (50000 UNIT) CAPS capsule TAKE 1 CAPSULE BY MOUTH EVERY 7 DAYS Patient taking differently: Take 50,000 Units by mouth every Tuesday. At midday 02/05/21   [name], FNP    Physical Exam: Vitals:   09/03/21 1845 09/03/21 1900 09/03/21 2000 09/03/21 2020  BP:  (!) 153/96 (!) 178/105 (!) 145/114  Pulse: 97 95 96 93  Resp: (!) 28 (!) 23 (!) 26 16  Temp:      TempSrc:      SpO2: 100% 100% 98% 98%  Weight:      Height:        Constitutional: NAD, calm, comfortable Vitals:   09/03/21 1845 09/03/21 1900 09/03/21 2000 09/03/21 2020  BP:  (!) 153/96 (!) 178/105 (!) 145/114  Pulse: 97 95 96 93  Resp: (!) 28 (!)  23 (!) 26 16  Temp:      TempSrc:      SpO2: 100% 100% 98% 98%  Weight:      Height:       General: WDWN, Alert and oriented x3.  Eyes: EOMI, PERRL, conjunctivae normal.  Sclera nonicteric HENT:  [place]/AT, external ears normal.  Nares patent without epistasis.  Mucous membranes are moist. Posterior pharynx  clear  Neck: Soft, normal range of motion, supple, no masses, Trachea midline Respiratory: clear to auscultation bilaterally, no wheezing, no crackles. Normal respiratory effort. No accessory muscle use.  Cardiovascular: Regular rate and rhythm, 2/6 systolic murmur. No rubs / gallops. No extremity edema. 2+ pedal pulses.  Abdomen: Soft, no tenderness, nondistended, no rebound or guarding. No masses palpated. Bowel sounds normoactive Musculoskeletal: FROM. No cyanosis. No joint deformity upper and lower extremities. Normal muscle tone. [place]la in left upper arm with thrill.  Skin: Warm, dry, intact no rashes, lesions, ulcers. No induration Neurologic: CN 2-12 grossly intact.  Normal speech. Strength 5/5 in all extremities.   Psychiatric: Normal judgment and insight.  Normal mood.    Labs on Admission: I have personally reviewed following labs and imaging studies  CBC: Recent Labs  Lab 09/03/21 1732 09/03/21 1739  WBC 8.2  --   HGB 11.1* 11.2*  HCT 31.4* 33.0*  MCV 90.8  --   PLT 257  --     Basic Metabolic Panel: Recent Labs  Lab 09/03/21 1732 09/03/21 1739  NA 138 137  K 3.4* 3.3*  CL 96* 102  CO2 22  --   GLUCOSE 86 86  BUN 67* 60*  CREATININE 16.14* >18.00*  CALCIUM 10.3  --     GFR: CrCl cannot be calculated (This lab value cannot be used to calculate CrCl because it is not a number: >18.00).  Liver Function Tests: Recent Labs  Lab 09/03/21 1732  AST 22  ALT 15  ALKPHOS 73  BILITOT 0.8  PROT 7.9  ALBUMIN 4.2    Urine analysis:    Component Value Date/Time   COLORURINE YELLOW 07/16/2021 2209   APPEARANCEUR CLEAR 07/16/2021 2209   LABSPEC 1.025 07/16/2021 2209   PHURINE 5.5 07/16/2021 2209   GLUCOSEU NEGATIVE 07/16/2021 2209   HGBUR NEGATIVE 07/16/2021 2209   BILIRUBINUR NEGATIVE 07/16/2021 2209   BILIRUBINUR neg 12/27/2019 0913   KETONESUR NEGATIVE 07/16/2021 2209   PROTEINUR >300 (A) 07/16/2021 2209   UROBILINOGEN 0.2 12/27/2019 0913    UROBILINOGEN 0.2 01/15/2018 1140   NITRITE NEGATIVE 07/16/2021 2209   LEUKOCYTESUR TRACE (A) 07/16/2021 2209    Radiological Exams on Admission: DG Chest Port 1 View  Result Date: 09/03/2021 CLINICAL DATA:  Chest pain. EXAM: PORTABLE CHEST 1 VIEW COMPARISON:  06/17/2021. FINDINGS: Mild enlargement of the cardiac silhouette. Pulmonary vascular congestion. Low lung volumes with mild bibasilar opacities. No visible pleural effusions or pneumothorax. No acute osseous abnormality. IMPRESSION: 1. Mild cardiomegaly and pulmonary vascular congestion. 2. Low lung volumes with mild bibasilar opacities, likely atelectasis. Electronically Signed   By: Margaretha Sheffield M.D.   On: 09/03/2021 18:36    EKG: Independently reviewed.  EKG shows normal sinus rhythm with mild ST elevation in anterior leads.  QTc 459  Assessment/Plan Principal Problem:   NSTEMI (non-ST elevated myocardial infarction) Mr. Lizak is admitted to Cardiac telemetry.  Initial troponin is 108 which is above his baseline level of 60-70.  Patient been evaluated by cardiology.  He is started on heparin infusion for anticoagulation. Monitor serial troponin levels  to the night.  Cardiology will make further recommendations based upon troponin trend Nitroglycerin paste to chest wall  Active Problems:   Essential hypertension Continue home medication of Norvasc, hydralazine.  Was changed to Toprol from Coreg by cardiology.  Monitor blood pressure    Chronic diastolic (congestive) heart failure Continue home regimen. Cardiology started patient on Toprol in place of Coreg.    End stage renal disease  Patient does daily peritoneal dialysis.  He was not able to do dialysis today secondary to the chest pain. Nephrology, Dr. Posey Pronto, consulted and will see patient in the morning to arrange for dialysis while in the hospital    Mixed hyperlipidemia Lipitor daily as lipitor does not have contraindication with renal disease. Check lipid panel     OSA (obstructive sleep apnea) CPAP at night.     HIV disease  Continue home medication of Biktarvy   DVT prophylaxis: Is on Heparin infusion for anticoagulation with NSTEMI  Code Status:   Full Code  Family Communication:  Diagnosis and plan discussed with patient.  Patient verbalized understanding and agrees with plan.  Further recommendations to follow as clinically indicated Disposition Plan:   Patient is from:  Home  Anticipated DC to:  Home  Anticipated DC date:  Anticipate 2 midnight or more stay in [place] called:  Cardiology consulted by ER physician.  Nephrology-Dr. Patel-consulted and will see in am to arrange dialysis  Admission status:  Inpatient  [name] Ivyonna Hoelzel MD [facility]  How to contact the [facility] Attending or Consulting provider [place] or covering provider during after hours [place], for this patient?   Check the care team in [facility] and look for a) attending/consulting [facility] provider listed and b) the [facility] team listed Log into [email or web address] and use 's universal password to access. If you do not have the password, please contact the hospital operator. Locate the [facility] provider you are looking for under [facility] and page to a number that you can be directly reached. If you still have difficulty reaching the provider, please page the [facility] (Director on Call) for the Hospitalists listed on amion for assistance.  09/03/2021, 9:21 PM

## 2021-09-03 NOTE — ED Provider Notes (Signed)
Emergency Medicine Provider Triage Evaluation Note  Albert Eaton , a 55 y.o. male  was evaluated in triage.  Pt complains of chest pain and shortness of breath.  States that this morning he woke up and felt dizzy.  States that he was taking his pulse at home and had a highest of 170s.  He states that he then went to dialysis, was connected to dialysis and initially started but then began having shortness of breath and chest pain.  He describes it as constant, central, nonradiating chest pressure associated with nausea without vomiting and shortness of breath.  He states that he then was disconnected from dialysis and could not complete his full treatment.  Denies fever, recent illnesses, syncope  Review of Systems  Positive: Chest pain Negative: Fever   Physical Exam  BP (!) 137/106 (BP Location: Right Arm)   Pulse (!) 113   Temp 98.5 F (36.9 C) (Oral)   Resp (!) 22   SpO2 100%  Gen:   Awake, slightly tachypneic Resp:  Normal effort, lungs clear to auscultation bilaterally MSK:   Moves extremities without difficulty  Other:  S1/S2 without obvious murmur.  Pulses equal bilaterally.  Left AV fistula with bruit and thrill.  Medical Decision Making  Medically screening exam initiated at 5:29 PM.  Appropriate orders placed.  Albert Eaton was informed that the remainder of the evaluation will be completed by another provider, this initial triage assessment does not replace that evaluation, and the importance of remaining in the ED until their evaluation is complete.     Mickie Hillier, PA-C 09/03/21 1731    Godfrey Pick, MD 09/04/21 [phone]

## 2021-09-04 ENCOUNTER — Encounter (HOSPITAL_COMMUNITY)
Admission: EM | Disposition: A | Discharge status: Home / Self Care | Payer: Self-pay | Source: Home / Self Care | Attending: Family Medicine

## 2021-09-04 DIAGNOSIS — R0789 Other chest pain: Secondary | ICD-10-CM

## 2021-09-04 DIAGNOSIS — R079 Chest pain, unspecified: Secondary | ICD-10-CM

## 2021-09-04 DIAGNOSIS — I2510 Atherosclerotic heart disease of native coronary artery without angina pectoris: Principal | ICD-10-CM

## 2021-09-04 DIAGNOSIS — I10 Essential (primary) hypertension: Secondary | ICD-10-CM

## 2021-09-04 DIAGNOSIS — I214 Non-ST elevation (NSTEMI) myocardial infarction: Secondary | ICD-10-CM | POA: Diagnosis not present

## 2021-09-04 DIAGNOSIS — N186 End stage renal disease: Secondary | ICD-10-CM

## 2021-09-04 DIAGNOSIS — R002 Palpitations: Secondary | ICD-10-CM

## 2021-09-04 DIAGNOSIS — G4733 Obstructive sleep apnea (adult) (pediatric): Secondary | ICD-10-CM

## 2021-09-04 DIAGNOSIS — B20 Human immunodeficiency virus [HIV] disease: Secondary | ICD-10-CM

## 2021-09-04 DIAGNOSIS — R778 Other specified abnormalities of plasma proteins: Secondary | ICD-10-CM

## 2021-09-04 DIAGNOSIS — I5032 Chronic diastolic (congestive) heart failure: Secondary | ICD-10-CM

## 2021-09-04 DIAGNOSIS — E782 Mixed hyperlipidemia: Secondary | ICD-10-CM

## 2021-09-04 HISTORY — PX: RIGHT/LEFT HEART CATH AND CORONARY ANGIOGRAPHY: CATH118266

## 2021-09-04 LAB — LIPID PANEL
Cholesterol: 126 mg/dL (ref 0–200)
HDL: 34 mg/dL — ABNORMAL LOW (ref >40)
LDL Cholesterol: 28 mg/dL (ref 0–99)
Total CHOL/HDL Ratio: 3.7 RATIO
Triglycerides: 320 mg/dL — ABNORMAL HIGH (ref <150)
VLDL: 64 mg/dL — ABNORMAL HIGH (ref 0–40)

## 2021-09-04 LAB — CBC
HCT: 24.3 % — ABNORMAL LOW (ref 39.0–52.0)
Hemoglobin: 8.4 g/dL — ABNORMAL LOW (ref 13.0–17.0)
MCH: 31.9 pg (ref 26.0–34.0)
MCHC: 34.6 g/dL (ref 30.0–36.0)
MCV: 92.4 fL (ref 80.0–100.0)
Platelets: 236 10*3/uL (ref 150–400)
RBC: 2.63 MIL/uL — ABNORMAL LOW (ref 4.22–5.81)
RDW: 15.9 % — ABNORMAL HIGH (ref 11.5–15.5)
WBC: 7.7 10*3/uL (ref 4.0–10.5)
nRBC: 0 % (ref 0.0–0.2)

## 2021-09-04 LAB — BASIC METABOLIC PANEL
Anion gap: 18 — ABNORMAL HIGH (ref 5–15)
BUN: 73 mg/dL — ABNORMAL HIGH (ref 6–20)
CO2: 19 mmol/L — ABNORMAL LOW (ref 22–32)
Calcium: 9.4 mg/dL (ref 8.9–10.3)
Chloride: 98 mmol/L (ref 98–111)
Creatinine, Ser: 16.4 mg/dL — ABNORMAL HIGH (ref 0.61–1.24)
GFR, Estimated: 3 mL/min — ABNORMAL LOW (ref >60)
Glucose, Bld: 96 mg/dL (ref 70–99)
Potassium: 3.4 mmol/L — ABNORMAL LOW (ref 3.5–5.1)
Sodium: 135 mmol/L (ref 135–145)

## 2021-09-04 LAB — POCT I-STAT EG7
Acid-base deficit: 5 mmol/L — ABNORMAL HIGH (ref 0.0–2.0)
Bicarbonate: 20.4 mmol/L (ref 20.0–28.0)
Calcium, Ion: 1.07 mmol/L — ABNORMAL LOW (ref 1.15–1.40)
HCT: 25 % — ABNORMAL LOW (ref 39.0–52.0)
Hemoglobin: 8.5 g/dL — ABNORMAL LOW (ref 13.0–17.0)
O2 Saturation: 74 %
Potassium: 4 mmol/L (ref 3.5–5.1)
Sodium: 137 mmol/L (ref 135–145)
TCO2: 22 mmol/L (ref 22–32)
pCO2, Ven: 37.5 mmHg — ABNORMAL LOW (ref 44.0–60.0)
pH, Ven: 7.344 (ref 7.250–7.430)
pO2, Ven: 41 mmHg (ref 32.0–45.0)

## 2021-09-04 LAB — POCT I-STAT 7, (LYTES, BLD GAS, ICA,H+H)
Acid-base deficit: 5 mmol/L — ABNORMAL HIGH (ref 0.0–2.0)
Bicarbonate: 19.6 mmol/L — ABNORMAL LOW (ref 20.0–28.0)
Calcium, Ion: 1.1 mmol/L — ABNORMAL LOW (ref 1.15–1.40)
HCT: 26 % — ABNORMAL LOW (ref 39.0–52.0)
Hemoglobin: 8.8 g/dL — ABNORMAL LOW (ref 13.0–17.0)
O2 Saturation: 97 %
Potassium: 4.2 mmol/L (ref 3.5–5.1)
Sodium: 136 mmol/L (ref 135–145)
TCO2: 21 mmol/L — ABNORMAL LOW (ref 22–32)
pCO2 arterial: 32.8 mmHg (ref 32.0–48.0)
pH, Arterial: 7.385 (ref 7.350–7.450)
pO2, Arterial: 92 mmHg (ref 83.0–108.0)

## 2021-09-04 LAB — HEPARIN LEVEL (UNFRACTIONATED): Heparin Unfractionated: 0.11 IU/mL — ABNORMAL LOW (ref 0.30–0.70)

## 2021-09-04 SURGERY — RIGHT/LEFT HEART CATH AND CORONARY ANGIOGRAPHY
Anesthesia: LOCAL

## 2021-09-04 MED ORDER — FENTANYL CITRATE (PF) 100 MCG/2ML IJ SOLN
INTRAMUSCULAR | Status: AC
  Filled 2021-09-04: qty 2

## 2021-09-04 MED ORDER — FENTANYL CITRATE (PF) 100 MCG/2ML IJ SOLN
INTRAMUSCULAR | Status: DC | PRN
  Administered 2021-09-04: 50 ug via INTRAVENOUS

## 2021-09-04 MED ORDER — SODIUM CHLORIDE 0.9 % IV SOLN
INTRAVENOUS | Status: DC | PRN
  Administered 2021-09-04: 10 mL/h via INTRAVENOUS

## 2021-09-04 MED ORDER — SODIUM CHLORIDE 0.9% FLUSH: 3.0000 mL | INTRAVENOUS | Status: DC | PRN

## 2021-09-04 MED ORDER — SODIUM CHLORIDE 0.9% FLUSH
3.0000 mL | Freq: Two times a day (BID) | INTRAVENOUS | Status: DC
  Administered 2021-09-05: 3 mL via INTRAVENOUS

## 2021-09-04 MED ORDER — LIDOCAINE HCL (PF) 1 % IJ SOLN
INTRAMUSCULAR | Status: DC | PRN
  Administered 2021-09-04: 15 mL

## 2021-09-04 MED ORDER — SODIUM CHLORIDE 0.9% FLUSH
3.0000 mL | Freq: Two times a day (BID) | INTRAVENOUS | Status: DC
  Administered 2021-09-04 – 2021-09-05 (×2): 3 mL via INTRAVENOUS

## 2021-09-04 MED ORDER — HEPARIN 1000 UNIT/ML FOR PERITONEAL DIALYSIS
INTRAPERITONEAL | Status: DC | PRN
  Filled 2021-09-04: qty 5000

## 2021-09-04 MED ORDER — GENTAMICIN SULFATE 0.1 % EX CREA
1.0000 "application " | TOPICAL_CREAM | Freq: Every day | CUTANEOUS | Status: DC
  Administered 2021-09-04 – 2021-09-05 (×2): 1 via TOPICAL
  Filled 2021-09-04: qty 15

## 2021-09-04 MED ORDER — HEPARIN (PORCINE) IN NACL 1000-0.9 UT/500ML-% IV SOLN
INTRAVENOUS | Status: DC | PRN
  Administered 2021-09-04 (×2): 500 mL

## 2021-09-04 MED ORDER — SEVELAMER CARBONATE 800 MG PO TABS
3200.0000 mg | ORAL_TABLET | Freq: Three times a day (TID) | ORAL | Status: DC
  Administered 2021-09-04 – 2021-09-05 (×2): 3200 mg via ORAL
  Filled 2021-09-04 (×2): qty 4

## 2021-09-04 MED ORDER — HEPARIN BOLUS VIA INFUSION
2000.0000 [IU] | Freq: Once | INTRAVENOUS | Status: AC
  Administered 2021-09-04: 2000 [IU] via INTRAVENOUS
  Filled 2021-09-04: qty 2000

## 2021-09-04 MED ORDER — MIDAZOLAM HCL 2 MG/2ML IJ SOLN
INTRAMUSCULAR | Status: AC
  Filled 2021-09-04: qty 2

## 2021-09-04 MED ORDER — HEPARIN (PORCINE) IN NACL 1000-0.9 UT/500ML-% IV SOLN
INTRAVENOUS | Status: AC
  Filled 2021-09-04: qty 1000

## 2021-09-04 MED ORDER — HEPARIN 1000 UNIT/ML FOR PERITONEAL DIALYSIS: 500.0000 [IU] | INTRAMUSCULAR | Status: DC | PRN

## 2021-09-04 MED ORDER — LABETALOL HCL 5 MG/ML IV SOLN: 10.0000 mg | INTRAVENOUS | Status: AC | PRN

## 2021-09-04 MED ORDER — DELFLEX-LC/2.5% DEXTROSE 394 MOSM/L IP SOLN
INTRAPERITONEAL | Status: DC
  Administered 2021-09-04: 8400 mL via INTRAPERITONEAL

## 2021-09-04 MED ORDER — SODIUM CHLORIDE 0.9 % IV SOLN: INTRAVENOUS | Status: DC

## 2021-09-04 MED ORDER — INFLUENZA VAC SPLIT QUAD 0.5 ML IM SUSY
0.5000 mL | PREFILLED_SYRINGE | INTRAMUSCULAR | Status: AC
  Administered 2021-09-05: 0.5 mL via INTRAMUSCULAR
  Filled 2021-09-04 (×2): qty 0.5

## 2021-09-04 MED ORDER — MIDAZOLAM HCL 2 MG/2ML IJ SOLN
INTRAMUSCULAR | Status: DC | PRN
  Administered 2021-09-04: 2 mg via INTRAVENOUS

## 2021-09-04 MED ORDER — SODIUM CHLORIDE 0.9 % IV SOLN: 250.0000 mL | INTRAVENOUS | Status: DC | PRN

## 2021-09-04 MED ORDER — POTASSIUM CHLORIDE CRYS ER 20 MEQ PO TBCR
20.0000 meq | EXTENDED_RELEASE_TABLET | Freq: Once | ORAL | Status: AC
  Administered 2021-09-04: 20 meq via ORAL
  Filled 2021-09-04: qty 1

## 2021-09-04 MED ORDER — DELFLEX-LC/1.5% DEXTROSE 344 MOSM/L IP SOLN
INTRAPERITONEAL | Status: DC
  Administered 2021-09-04: 8400 mL via INTRAPERITONEAL

## 2021-09-04 MED ORDER — CARVEDILOL 25 MG PO TABS
25.0000 mg | ORAL_TABLET | Freq: Two times a day (BID) | ORAL | Status: DC
  Administered 2021-09-04 – 2021-09-05 (×2): 25 mg via ORAL
  Filled 2021-09-04 (×2): qty 1

## 2021-09-04 MED ORDER — IOHEXOL 350 MG/ML SOLN
INTRAVENOUS | Status: DC | PRN
  Administered 2021-09-04: 90 mL

## 2021-09-04 MED ORDER — LIDOCAINE HCL (PF) 1 % IJ SOLN
INTRAMUSCULAR | Status: AC
  Filled 2021-09-04: qty 30

## 2021-09-04 SURGICAL SUPPLY — 15 items
CATH INFINITI 5FR AL1 (CATHETERS) ×2 IMPLANT
CATH INFINITI 5FR MULTPACK ANG (CATHETERS) ×2 IMPLANT
CATH LAUNCHER 5F EBU3.5 (CATHETERS) ×2 IMPLANT
CATH SWAN GANZ 7F STRAIGHT (CATHETERS) ×2 IMPLANT
CLOSURE MYNX CONTROL 5F (Vascular Products) ×2 IMPLANT
KIT HEART LEFT (KITS) ×2 IMPLANT
KIT MICROPUNCTURE NIT STIFF (SHEATH) ×2 IMPLANT
PACK CARDIAC CATHETERIZATION (CUSTOM PROCEDURE TRAY) ×2 IMPLANT
SHEATH PINNACLE 5F 10CM (SHEATH) ×2 IMPLANT
SHEATH PINNACLE 7F 10CM (SHEATH) ×2 IMPLANT
SHEATH PROBE COVER 6X72 (BAG) ×2 IMPLANT
TRANSDUCER W/STOPCOCK (MISCELLANEOUS) ×2 IMPLANT
TUBING CIL FLEX 10 FLL-RA (TUBING) ×2 IMPLANT
WIRE EMERALD 3MM-J .025X260CM (WIRE) ×2 IMPLANT
WIRE EMERALD 3MM-J .035X150CM (WIRE) ×2 IMPLANT

## 2021-09-04 NOTE — Progress Notes (Signed)
ANTICOAGULATION CONSULT NOTE  Pharmacy Consult for heparin Indication: chest pain/ACS  Allergies  Allergen Reactions   Ace Inhibitors Cough and Other (See Comments)   Lisinopril Cough    Patient Measurements: Height: 5' 10"  (177.8 cm) Weight: 93.7 kg (206 lb 8 oz) IBW/kg (Calculated) : 73 Heparin Dosing Weight: 89.4 kg  Vital Signs: Temp: 98.1 F (36.7 C) (11/09 0021) Temp Source: Oral (11/09 0021) BP: 130/86 (11/09 0021) Pulse Rate: 79 (11/09 0021)  Labs: Recent Labs    09/03/21 1732 09/03/21 1739 09/03/21 2032 09/04/21 0347  HGB 11.1* 11.2*  --  8.4*  HCT 31.4* 33.0*  --  24.3*  PLT 257  --   --  236  HEPARINUNFRC  --   --   --  0.11*  CREATININE 16.14* >18.00*  --  16.40*  TROPONINIHS 108*  --  109*  --      Estimated Creatinine Clearance: 5.9 mL/min (A) (by C-G formula based on SCr of 16.4 mg/dL (H)).  Assessment:  55 y.o. male with chest pain for heparin   Goal of Therapy:  Heparin level 0.3-0.7 units/ml Monitor platelets by anticoagulation protocol: Yes   Plan:  Heparin 2000 units IV bolus, then increase heparin  1450 units/hr Check heparin level in 6 hours.   Phillis Knack, PharmD, BCPS

## 2021-09-04 NOTE — Plan of Care (Signed)
  Problem: Education: Goal: Understanding of CV disease, CV risk reduction, and recovery process will improve Outcome: Progressing   Problem: Activity: Goal: Ability to return to baseline activity level will improve Outcome: Progressing   Problem: Cardiovascular: Goal: Ability to achieve and maintain adequate cardiovascular perfusion will improve Outcome: Progressing   Problem: Cardiovascular: Goal: Vascular access site(s) Level 0-1 will be maintained Outcome: Progressing   Problem: Clinical Measurements: Goal: Ability to maintain clinical measurements within normal limits will improve Outcome: Progressing   Problem: Clinical Measurements: Goal: Cardiovascular complication will be avoided Outcome: Progressing   Problem: Pain Managment: Goal: General experience of comfort will improve Outcome: Progressing   Problem: Safety: Goal: Ability to remain free from injury will improve Outcome: Progressing   Problem: Skin Integrity: Goal: Risk for impaired skin integrity will decrease Outcome: Progressing

## 2021-09-04 NOTE — Progress Notes (Signed)
PROGRESS NOTE  Albert Eaton  NLZ:[id number] DOB: 09-27-1966 DOA: 09/03/2021 PCP: [name], FNP   Brief Narrative: Albert Eaton is a 55 y.o. male with medical history significant for HFpEF, ESRD on PD, nl nuc 2017, HIV (undetectable), HTN, hypothyroid, hep B (treated), OSA (uses CPAP intermittently) who presents for evaluation of chest pain.  He reports that this afternoon he started his peritoneal dialysis and shortly thereafter developed chest pain while at rest.  He reports the chest pain was in the substernal region and felt like a pressure that was a 7 out of 10 at its worst.  He states the pain did not radiate but was associated with shortness of breath and nausea.  He did not have any vomiting.  He denies any fever or chills.  He was given nitroglycerin sublingual and aspirin by EMS and chest pain improved.  When he arrived in the emergency room nitroglycerin paste was placed to chest wall.  He reports chest pressure is now a 1-2 out of 10.    ED Course: Mr. Stodghill has been hemodynamically stable in the emergency room.  He has been evaluated by cardiology.  Initial troponin was elevated at 108.  Started on heparin infusion for ACS.  Lab work was unremarkable CBC.  Initial troponin 108 and repeat troponin if you are later 109.  Sodium 138 potassium 3.4 chloride 96 bicarb 22 creatinine 16.14 BUN 67 calcium 10.3 albumin 4.2 alkaline phosphatase 73 AST 22 ALT 15, troponin 1.870.  COVID swab pending.  Hospitalist service been asked to admit patient for further management and evaluation  Assessment & Plan: Principal Problem:   NSTEMI (non-ST elevated myocardial infarction) ([place]) Active Problems:   HIV disease ([place])   OSA (obstructive sleep apnea)   Mixed hyperlipidemia   End stage renal disease ([place])   Chronic diastolic (congestive) heart failure ([place])   Essential hypertension  NSTEMI:  - Continue heparin gtt pending cardiology evaluation. Keep NPO in case of catheterization.  -  Continue prn NTG (improved chest pain previously) - Continue ASA, beta blocker, statin  HTN:  - Continue current medications   Chronic HFpEF:  - Meds as ordered, volume per nephrology  ESRD:  - Nephrology consulted, will guide PD tonight if remains admitted.   HLD:  - Continue statin  OSA:  - CPAP qHS  HIV disease: Undetectable VL - Continue ART  DVT prophylaxis: Heparin gtt Code Status: Full Family Communication: None at bedside Disposition Plan:  Status is: Inpatient  Remains inpatient appropriate because: ongoing cardiology work up for NSTEMI  Consultants:  Cardiology Nephrology  Procedures:  TBD  Antimicrobials: None   Subjective: Chest discomfort resolved after NTG + fentanyl yesterday, has not resumed. No bleeding, dyspnea, or other complaints currently. Desperately wants to eat/drink.  Objective: Vitals:   09/04/21 0800 09/04/21 0954 09/04/21 1149 09/04/21 1626  BP: (!) 139/93 138/85 (!) 127/93   Pulse: 75     Resp:      Temp:      TempSrc:      SpO2: 97%   92%  Weight:      Height:        Intake/Output Summary (Last 24 hours) at 09/04/2021 1648 Last data filed at 09/04/2021 0500 Gross per 24 hour  Intake 137.43 ml  Output --  Net 137.43 ml   Filed Weights   09/03/21 1733 09/04/21 0021  Weight: 89.4 kg 93.7 kg    Gen: 55 y.o. male in no distress Pulm: Non-labored breathing  room air. Clear to auscultation bilaterally.  CV: Regular rate and rhythm. No murmur, rub, or gallop. No JVD, + trace pedal edema. GI: Abdomen soft, distended, nontender, PD cath site c/d/i. Ext: Warm, no deformities Skin: No rashes, lesions or ulcers on visualized skin Neuro: Alert and oriented. No focal neurological deficits. Psych: Judgement and insight appear normal. Mood & affect appropriate.   Data Reviewed: I have personally reviewed following labs and imaging studies  CBC: Recent Labs  Lab 09/03/21 1732 09/03/21 1739 09/04/21 0347  WBC 8.2  --  7.7   HGB 11.1* 11.2* 8.4*  HCT 31.4* 33.0* 24.3*  MCV 90.8  --  92.4  PLT 257  --  161   Basic Metabolic Panel: Recent Labs  Lab 09/03/21 1732 09/03/21 1739 09/04/21 0347  NA 138 137 135  K 3.4* 3.3* 3.4*  CL 96* 102 98  CO2 22  --  19*  GLUCOSE 86 86 96  BUN 67* 60* 73*  CREATININE 16.14* >18.00* 16.40*  CALCIUM 10.3  --  9.4   GFR: Estimated Creatinine Clearance: 5.9 mL/min (A) (by C-G formula based on SCr of 16.4 mg/dL (H)). Liver Function Tests: Recent Labs  Lab 09/03/21 1732  AST 22  ALT 15  ALKPHOS 73  BILITOT 0.8  PROT 7.9  ALBUMIN 4.2   No results for input(s): LIPASE, AMYLASE in the last 168 hours. No results for input(s): AMMONIA in the last 168 hours. Coagulation Profile: No results for input(s): INR, PROTIME in the last 168 hours. Cardiac Enzymes: No results for input(s): CKTOTAL, CKMB, CKMBINDEX, TROPONINI in the last 168 hours. BNP (last 3 results) No results for input(s): PROBNP in the last 8760 hours. HbA1C: No results for input(s): HGBA1C in the last 72 hours. CBG: No results for input(s): GLUCAP in the last 168 hours. Lipid Profile: Recent Labs    09/04/21 0347  CHOL 126  HDL 34*  LDLCALC 28  TRIG 320*  CHOLHDL 3.7   Thyroid Function Tests: Recent Labs    09/03/21 1813  TSH 1.870   Anemia Panel: No results for input(s): VITAMINB12, FOLATE, FERRITIN, TIBC, IRON, RETICCTPCT in the last 72 hours. Urine analysis:    Component Value Date/Time   COLORURINE YELLOW 07/16/2021 2209   APPEARANCEUR CLEAR 07/16/2021 2209   LABSPEC 1.025 07/16/2021 2209   PHURINE 5.5 07/16/2021 2209   GLUCOSEU NEGATIVE 07/16/2021 2209   HGBUR NEGATIVE 07/16/2021 2209   BILIRUBINUR NEGATIVE 07/16/2021 2209   BILIRUBINUR neg 12/27/2019 0913   KETONESUR NEGATIVE 07/16/2021 2209   PROTEINUR >300 (A) 07/16/2021 2209   UROBILINOGEN 0.2 12/27/2019 0913   UROBILINOGEN 0.2 01/15/2018 1140   NITRITE NEGATIVE 07/16/2021 2209   LEUKOCYTESUR TRACE (A) 07/16/2021  2209   Recent Results (from the past 240 hour(s))  Resp Panel by RT-PCR (Flu A&B, Covid) Nasopharyngeal Swab     Status: None   Collection Time: 09/03/21 10:11 PM   Specimen: Nasopharyngeal Swab; Nasopharyngeal(NP) swabs in vial transport medium  Result Value Ref Range Status   SARS Coronavirus 2 by RT PCR NEGATIVE NEGATIVE Final    Comment: (NOTE) SARS-CoV-2 target nucleic acids are NOT DETECTED.  The SARS-CoV-2 RNA is generally detectable in upper respiratory specimens during the acute phase of infection. The lowest concentration of SARS-CoV-2 viral copies this assay can detect is 138 copies/mL. A negative result does not preclude SARS-Cov-2 infection and should not be used as the sole basis for treatment or other patient management decisions. A negative result may occur with  improper  specimen collection/handling, submission of specimen other than nasopharyngeal swab, presence of viral mutation(s) within the areas targeted by this assay, and inadequate number of viral copies(<138 copies/mL). A negative result must be combined with clinical observations, patient history, and epidemiological information. The expected result is Negative.  Fact Sheet for Patients:  EntrepreneurPulse.com.au  Fact Sheet for Healthcare Providers:  IncredibleEmployment.be  This test is no t yet approved or cleared by the [place] FDA and  has been authorized for detection and/or diagnosis of SARS-CoV-2 by FDA under an Emergency Use Authorization (EUA). This EUA will remain  in effect (meaning this test can be used) for the duration of the COVID-19 declaration under Section 564(b)(1) of the Act, 21 U.S.C.section 360bbb-3(b)(1), unless the authorization is terminated  or revoked sooner.       Influenza A by PCR NEGATIVE NEGATIVE Final   Influenza B by PCR NEGATIVE NEGATIVE Final    Comment: (NOTE) The Xpert Xpress SARS-CoV-2/FLU/RSV plus assay is intended as  an aid in the diagnosis of influenza from Nasopharyngeal swab specimens and should not be used as a sole basis for treatment. Nasal washings and aspirates are unacceptable for Xpert Xpress SARS-CoV-2/FLU/RSV testing.  Fact Sheet for Patients: EntrepreneurPulse.com.au  Fact Sheet for Healthcare Providers: IncredibleEmployment.be  This test is not yet approved or cleared by the [place] FDA and has been authorized for detection and/or diagnosis of SARS-CoV-2 by FDA under an Emergency Use Authorization (EUA). This EUA will remain in effect (meaning this test can be used) for the duration of the COVID-19 declaration under Section 564(b)(1) of the Act, 21 U.S.C. section 360bbb-3(b)(1), unless the authorization is terminated or revoked.  Performed at [facility], [place] [address], [place], [place] [address]       Radiology Studies: DG Chest Port 1 View  Result Date: 09/03/2021 CLINICAL DATA:  Chest pain. EXAM: PORTABLE CHEST 1 VIEW COMPARISON:  06/17/2021. FINDINGS: Mild enlargement of the cardiac silhouette. Pulmonary vascular congestion. Low lung volumes with mild bibasilar opacities. No visible pleural effusions or pneumothorax. No acute osseous abnormality. IMPRESSION: 1. Mild cardiomegaly and pulmonary vascular congestion. 2. Low lung volumes with mild bibasilar opacities, likely atelectasis. Electronically Signed   By: Margaretha Sheffield M.D.   On: 09/03/2021 18:36    Scheduled Meds:  [MAR Hold] amLODipine  10 mg Oral Q1200   [MAR Hold] aspirin EC  81 mg Oral q AM   [MAR Hold] atorvastatin  40 mg Oral Daily   [MAR Hold] bictegravir-emtricitabine-tenofovir AF  1 tablet Oral Daily   [MAR Hold] calcitRIOL  0.5 mcg Oral Q M,W,F   [MAR Hold] carvedilol  25 mg Oral BID WC   [MAR Hold] gentamicin cream  1 application Topical Daily   [MAR Hold] hydrALAZINE  100 mg Oral TID   [START ON 09/05/2021] influenza vac split quadrivalent PF  0.5 mL  Intramuscular Tomorrow-1000   [MAR Hold] levothyroxine  25 mcg Oral QAC breakfast   [MAR Hold] multivitamin  1 tablet Oral QHS   [MAR Hold] nitroGLYCERIN  1 inch Topical Q6H   [MAR Hold] sevelamer carbonate  3,200 mg Oral TID WC   [MAR Hold] sodium chloride flush  3 mL Intravenous Q12H   [MAR Hold] sodium chloride flush  3 mL Intravenous Q12H   Continuous Infusions:  [MAR Hold] sodium chloride     sodium chloride     sodium chloride     sodium chloride 10 mL/hr (09/04/21 1612)   [MAR Hold] dialysis solution 1.5% low-MG/low-CA     [  MAR Hold] dialysis solution 2.5% low-MG/low-CA     heparin Stopped (09/04/21 1608)     LOS: 1 day   Time spent: 25 minutes.  Patrecia Pour, MD [facility] [email or web address] 09/04/2021, 4:48 PM

## 2021-09-04 NOTE — Consult Note (Signed)
[place]  Reason for Consultation: co management of ESRD and assoc conditions Requesting Provider: Dr. Bonner Puna  HPI: Albert Eaton is an 55 y.o. male HFpEF, ESRD on PD, HIV (VL undetectable), HTN, hypothyroidism, treated hepatitis B, OSA not on CPAP currently admitted for CP and nephrology is consulted for evaluation and management of ESRD and assoc conditions.   Presented yesterday with SSCP.  Cardiology following, not thought to represent ACS but perhaps demand ischemia with SVT episodes (AT).    He notes CP has resolved.  He states unassociated with PD.  He has chronic abd fullness with PD treatments but no acute issues.  No cloudy fluid, drain pain, constipation.  Uses heparin nightly with treatments.   PMH: Past Medical History:  Diagnosis Date   Anemia    Aortic atherosclerosis (HCC)    Arthritis    Asthma    Cancer ([place])    renal cyst   Chronic diastolic CHF (congestive heart failure) ([place]) 01/06/2017   Echo 7/16 [facility] in [place], [name]) Mild AI, mild LAE, mild concentric LVH, EF 55, normal wall motion, mild to moderate MR, mild PI, RVSP 55 // Echo 10/09/16 ([facility]):  Moderate LVH, grade 2 diastolic dysfunction, mild MR, moderate LAE    CKD (chronic kidney disease)    Dialysis Mon Wed Fri   Esophagitis    GERD (gastroesophageal reflux disease)    Gout    no current problems   Heart murmur    never caused any problems   Hepatitis    Hep B   History of nuclear stress test    a. Nuc study 7/16: no scar or ischemia, EF 42 // b. Nuc study 12/17: EF 48, ?small apical ischemia, Low Risk   HIV (human immunodeficiency virus infection) ([place])    HLD (hyperlipidemia)    Hypertension    Hypertensive heart disease with CHF (congestive heart failure) ([place]) 10/09/2016   Hypothyroidism    Mitral regurgitation    OSA (obstructive sleep apnea)    Post-operative nausea and vomiting    1 time as a child, no problems as an adult   Sleep  apnea    uses c-pap   Thyroid disease    Wears glasses    Wears partial dentures    PSH: Past Surgical History:  Procedure Laterality Date   AV FISTULA PLACEMENT Left 07/02/2018   Procedure: Creation of Left arm BRACHIOBASILIC ARTERIOVENOUS  FISTULA;  Surgeon: Marty Heck, MD;  Location: [facility];  Service: Vascular;  Laterality: Left;   [place] Left 08/27/2018   Procedure: Left arm BRACHIOBASILIC VEIN TRANSPOSITION SECOND STAGE;  Surgeon: Marty Heck, MD;  Location: [place];  Service: Vascular;  Laterality: Left;   BUBBLE STUDY  09/03/2020   Procedure: BUBBLE STUDY;  Surgeon: Fay Records, MD;  Location: [name];  Service: Cardiovascular;;   CAPD INSERTION N/A 05/30/2021   Procedure: LAPAROSCOPIC INSERTION CONTINUOUS AMBULATORY PERITONEAL DIALYSIS  (CAPD) CATHETER;  Surgeon: [name], MD;  [facility];  Service: Vascular;  Laterality: N/A;   CHOLECYSTECTOMY     COLONOSCOPY W/ BIOPSIES AND POLYPECTOMY     GIVENS CAPSULE STUDY N/A 03/01/2018   Procedure: GIVENS CAPSULE STUDY;  Surgeon: Jerene Bears, MD;  Location: [facility];  Service: Gastroenterology;  Laterality: N/A;   HERNIA REPAIR     As baby   MULTIPLE TOOTH EXTRACTIONS     NEPHRECTOMY Left 02/18/2021   NOSE SURGERY  RENAL BIOPSY     TEE WITHOUT CARDIOVERSION N/A 09/03/2020   Procedure: TRANSESOPHAGEAL ECHOCARDIOGRAM (TEE);  Surgeon: Fay Records, MD;  Location: [facility] ENDOSCOPY;  Service: Cardiovascular;  Laterality: N/A;   UPPER GI ENDOSCOPY  04/05/2021    Past Medical History:  Diagnosis Date   Anemia    Aortic atherosclerosis ([place])    Arthritis    Asthma    Cancer ([place])    renal cyst   Chronic diastolic CHF (congestive heart failure) ([place]) 01/06/2017   Echo 7/16 [facility] in [place], [name]) Mild AI, mild LAE, mild concentric LVH, EF 55, normal wall motion, mild to moderate MR, mild PI, RVSP 55 // Echo 10/09/16 ([facility]):  Moderate LVH, grade 2 diastolic  dysfunction, mild MR, moderate LAE    CKD (chronic kidney disease)    Dialysis Mon Wed Fri   Esophagitis    GERD (gastroesophageal reflux disease)    Gout    no current problems   Heart murmur    never caused any problems   Hepatitis    Hep B   History of nuclear stress test    a. Nuc study 7/16: no scar or ischemia, EF 42 // b. Nuc study 12/17: EF 48, ?small apical ischemia, Low Risk   HIV (human immunodeficiency virus infection) ([place])    HLD (hyperlipidemia)    Hypertension    Hypertensive heart disease with CHF (congestive heart failure) ([place]) 10/09/2016   Hypothyroidism    Mitral regurgitation    OSA (obstructive sleep apnea)    Post-operative nausea and vomiting    1 time as a child, no problems as an adult   Sleep apnea    uses c-pap   Thyroid disease    Wears glasses    Wears partial dentures     Medications:  I have reviewed the patient's current medications.   Medications Prior to Admission  Medication Sig Dispense Refill   ACETAMINOPHEN EXTRA STRENGTH 500 MG tablet Take 500 mg by mouth every 6 (six) hours as needed (pain.).     albuterol (PROAIR HFA) 108 (90 Base) MCG/ACT inhaler INHALE 2 PUFFS INTO THE LUNGS EVERY 4 HOURS AS NEEDED FOR WHEEZING OR SHORTNESS OF BREATH 17 g 1   allopurinol (ZYLOPRIM) 100 MG tablet TAKE 1 TABLET(100 MG) BY MOUTH TWICE DAILY FOR 60 DOSES 60 tablet 0   amLODipine (NORVASC) 10 MG tablet Take 1 tablet (10 mg total) by mouth daily. (Patient taking differently: Take 10 mg by mouth daily at 12 noon. At midday) 30 tablet 1   aspirin EC 81 MG tablet Take 1 tablet (81 mg total) by mouth daily. Swallow whole. (Patient taking differently: Take 81 mg by mouth in the morning. Swallow whole.) 90 tablet 3   azelastine (ASTELIN) 0.1 % nasal spray PLACE 2 SPRAYS INTO EACH NOSTRIL TWICE DAILY AS NEEDED FOR RHINITIS AS DIRECTED (Patient taking differently: Place 2 sprays into both nostrils 2 (two) times daily as needed for allergies or rhinitis.) 30 mL 5    BIKTARVY 50-200-25 MG TABS tablet TAKE 1 TABLET BY MOUTH DAILY 30 tablet 5   calcitRIOL (ROCALTROL) 0.5 MCG capsule Take 0.5 mcg by mouth every Monday, Wednesday, and Friday.     carvedilol (COREG) 25 MG tablet TAKE 1 TABLET BY MOUTH TWICE DAILY WITH FOOD (Patient taking differently: Take 25 mg by mouth 2 (two) times daily with a meal.) 60 tablet 2   chlorproMAZINE (THORAZINE) 10 MG tablet Take 1 tablet (10 mg total) by mouth 3 (three)  times daily as needed for hiccoughs. (Patient taking differently: Take 10 mg by mouth 3 (three) times daily.) 60 tablet 0   clobetasol (OLUX) 0.05 % topical foam Apply topically 2 (two) times daily. 50 g 2   clonazePAM (KLONOPIN) 0.5 MG tablet Take 1 tablet (0.5 mg total) by mouth at bedtime. 30 tablet 2   clotrimazole-betamethasone (LOTRISONE) cream Apply topically 2 (two) times daily. X 3-5 days. IF symptoms continue, call office (Patient taking differently: Apply 1 application topically 2 (two) times daily as needed (rectal discomfort).) 30 g 0   docusate sodium (COLACE) 100 MG capsule Take 100 mg by mouth 2 (two) times daily.     fluticasone (FLOVENT HFA) 110 MCG/ACT inhaler INHALE 2 PUFFS INTO THE LUNGS TWICE DAILY 12 g 4   gabapentin (NEURONTIN) 300 MG capsule TAKE 1 CAPSULE(300 MG) BY MOUTH THREE TIMES DAILY (Patient taking differently: Take 300 mg by mouth 3 (three) times daily.) 90 capsule 3   gentamicin cream (GARAMYCIN) 0.1 % Apply 1 application topically See admin instructions. Apply when catheter is flushed and cleaned     hydrALAZINE (APRESOLINE) 100 MG tablet Take 1 tablet (100 mg total) by mouth 3 (three) times daily. 270 tablet 3   hyoscyamine (LEVSIN SL) 0.125 MG SL tablet Place 1-2 tablets (0.125-0.25 mg total) under the tongue every 8 (eight) hours as needed (lower abdominal pain, spasm). 90 tablet 2   ipratropium (ATROVENT) 0.06 % nasal spray USE 2 SPRAYS IN EACH NOSTRIL 2 TO 3 TIMES DAILY AS NEEDED (Patient taking differently: Place 2 sprays into  both nostrils 3 (three) times daily as needed (allergies).) 15 mL 1   iron sucrose (VENOFER) 20 MG/ML injection Inject into the vein.     lactulose (CHRONULAC) 10 GM/15ML solution Take 20 g by mouth daily as needed for moderate constipation.      levothyroxine (SYNTHROID) 25 MCG tablet TAKE 1 TABLET BY MOUTH DAILY BEFORE BREAKFAST (Patient taking differently: Take 25 mcg by mouth daily before breakfast.) 90 tablet 3   lidocaine-prilocaine (EMLA) cream Apply 1 application topically See admin instructions. Apply to access site 1-2 hours before dialysis     lubiprostone (AMITIZA) 24 MCG capsule TAKE 1 CAPSULE(24 MCG) BY MOUTH TWICE DAILY WITH A MEAL (Patient taking differently: Take 24 mcg by mouth in the morning and at bedtime.) 60 capsule 4   methocarbamol (ROBAXIN) 500 MG tablet Take 1 tablet (500 mg total) by mouth every 8 (eight) hours as needed for muscle spasms. (Patient taking differently: Take 500 mg by mouth at bedtime.) 30 tablet 0   Methoxy PEG-Epoetin Beta (MIRCERA IJ) Inject into the skin.     multivitamin (RENA-VIT) TABS tablet Take 1 tablet by mouth at bedtime.     mupirocin ointment (BACTROBAN) 2 % Apply 1 application topically daily as needed (apply to port after dialysis).      Olopatadine HCl (PAZEO) 0.7 % SOLN Place 1 drop into both eyes daily. (Patient taking differently: Place 1 drop into both eyes daily as needed (itchy eyes).) 2.5 mL 5   oxyCODONE-acetaminophen (PERCOCET) 10-325 MG tablet Take 1 tablet by mouth every 6 (six) hours as needed for pain. 12 tablet 0   pantoprazole (PROTONIX) 40 MG tablet TAKE 1 TABLET(40 MG) BY MOUTH DAILY 30 tablet 6   predniSONE (DELTASONE) 10 MG tablet Take 1 tablet (10 mg total) by mouth daily with breakfast. 30 tablet 0   rOPINIRole (REQUIP) 0.5 MG tablet Take 0.5 mg by mouth at bedtime.  rOPINIRole (REQUIP) 1 MG tablet Take 1 tablet (1 mg total) by mouth at bedtime. 30 tablet 3   rosuvastatin (CRESTOR) 10 MG tablet Take 1 tablet (10 mg  total) by mouth daily. (Patient taking differently: Take 10 mg by mouth in the morning.) 90 tablet 3   senna (SENOKOT) 8.6 MG TABS tablet Take 2 tablets by mouth every evening.     sevelamer carbonate (RENVELA) 800 MG tablet Take 1,600 mg by mouth 3 (three) times daily with meals.     Spacer/Aero-Holding Chambers DEVI 1 Device by Does not apply route 2 (two) times a day. 1 each 1   Vitamin D, Ergocalciferol, (DRISDOL) 1.25 MG (50000 UNIT) CAPS capsule TAKE 1 CAPSULE BY MOUTH EVERY 7 DAYS (Patient taking differently: Take 50,000 Units by mouth every Tuesday. At midday) 12 capsule 1    ALLERGIES:   Allergies  Allergen Reactions   Ace Inhibitors Cough and Other (See Comments)   Lisinopril Cough    FAM HX: Family History  Problem Relation Age of Onset   Heart failure Mother    Heart attack Maternal Grandmother 29   Hypertension Sister    Multiple sclerosis Sister    Hypertension Brother    Hypertension Sister    Scoliosis Other    Allergic rhinitis Neg Hx    Angioedema Neg Hx    Asthma Neg Hx    Eczema Neg Hx    Immunodeficiency Neg Hx    Urticaria Neg Hx    Colon cancer Neg Hx    Pancreatic cancer Neg Hx    Esophageal cancer Neg Hx     Social History:   reports that he quit smoking about 22 years ago. His smoking use included cigarettes. He smoked an average of 1 pack per day. He has never used smokeless tobacco. He reports current alcohol use. He reports that he does not use drugs.  ROS: 12 system ROS neg except per HPI  Blood pressure (!) 142/88, pulse 80, temperature 98.2 F (36.8 C), temperature source Oral, resp. rate 18, height 5' 10"  (1.778 m), weight 93.7 kg, SpO2 97 %. PHYSICAL EXAM: Gen: appears well lying in bed  Eyes: anicteric ENT: MMM Neck: no JVD CV:  RRR, no rub Abd:  soft, nontender, R mid abd PD catheter dressing c/d/i Lungs: clear GU: no foley Extr:  no edema Neuro: nonfocal Skin: no rashes   Results for orders placed or performed during the  hospital encounter of 09/03/21 (from the past 48 hour(s))  CBC     Status: Abnormal   Collection Time: 09/03/21  5:32 PM  Result Value Ref Range   WBC 8.2 4.0 - 10.5 K/uL   RBC 3.46 (L) 4.22 - 5.81 MIL/uL   Hemoglobin 11.1 (L) 13.0 - 17.0 g/dL   HCT 31.4 (L) 39.0 - 52.0 %   MCV 90.8 80.0 - 100.0 fL   MCH 32.1 26.0 - 34.0 pg   MCHC 35.4 30.0 - 36.0 g/dL   RDW 15.8 (H) 11.5 - 15.5 %   Platelets 257 150 - 400 K/uL   nRBC 0.0 0.0 - 0.2 %    Comment: Performed at [facility], [address], [place], [place] [address]  Troponin I (High Sensitivity)     Status: Abnormal   Collection Time: 09/03/21  5:32 PM  Result Value Ref Range   Troponin I (High Sensitivity) 108 (HH) <18 ng/L    Comment: CRITICAL RESULT CALLED TO, READ BACK BY AND VERIFIED WITH:  T  DOROSSE RN 09/03/21 1909 BY R VERAAR (NOTE) Elevated high sensitivity troponin I (hsTnI) values and significant  changes across serial measurements may suggest ACS but many other  chronic and acute conditions are known to elevate hsTnI results.  Refer to the Links section for chest pain algorithms and additional  guidance. Performed at [facility], [place] [address] [place], [place] [address]   Comprehensive metabolic panel     Status: Abnormal   Collection Time: 09/03/21  5:32 PM  Result Value Ref Range   Sodium 138 135 - 145 mmol/L   Potassium 3.4 (L) 3.5 - 5.1 mmol/L   Chloride 96 (L) 98 - 111 mmol/L   CO2 22 22 - 32 mmol/L   Glucose, Bld 86 70 - 99 mg/dL    Comment: Glucose reference range applies only to samples taken after fasting for at least 8 hours.   BUN 67 (H) 6 - 20 mg/dL   Creatinine, Ser 16.14 (H) 0.61 - 1.24 mg/dL   Calcium 10.3 8.9 - 10.3 mg/dL   Total Protein 7.9 6.5 - 8.1 g/dL   Albumin 4.2 3.5 - 5.0 g/dL   AST 22 15 - 41 U/L   ALT 15 0 - 44 U/L   Alkaline Phosphatase 73 38 - 126 U/L   Total Bilirubin 0.8 0.3 - 1.2 mg/dL   GFR, Estimated 3 (L) >60 mL/min    Comment: (NOTE) Calculated using the  CKD-EPI Creatinine Equation (2021)    Anion gap 20 (H) 5 - 15    Comment: Performed at [facility] [address] [place], [place] [address]  I-Stat Chem 8, ED     Status: Abnormal   Collection Time: 09/03/21  5:39 PM  Result Value Ref Range   Sodium 137 135 - 145 mmol/L   Potassium 3.3 (L) 3.5 - 5.1 mmol/L   Chloride 102 98 - 111 mmol/L   BUN 60 (H) 6 - 20 mg/dL   Creatinine, Ser >18.00 (H) 0.61 - 1.24 mg/dL   Glucose, Bld 86 70 - 99 mg/dL    Comment: Glucose reference range applies only to samples taken after fasting for at least 8 hours.   Calcium, Ion 1.04 (L) 1.15 - 1.40 mmol/L   TCO2 22 22 - 32 mmol/L   Hemoglobin 11.2 (L) 13.0 - 17.0 g/dL   HCT 33.0 (L) 39.0 - 52.0 %  TSH     Status: None   Collection Time: 09/03/21  6:13 PM  Result Value Ref Range   TSH 1.870 0.350 - 4.500 uIU/mL    Comment: Performed by a 3rd Generation assay with a functional sensitivity of <=0.01 uIU/mL. Performed at [facility], [place] [address], [place], [place] [address]   Troponin I (High Sensitivity)     Status: Abnormal   Collection Time: 09/03/21  8:32 PM  Result Value Ref Range   Troponin I (High Sensitivity) 109 (HH) <18 ng/L    Comment: CRITICAL VALUE NOTED.  VALUE IS CONSISTENT WITH PREVIOUSLY REPORTED AND CALLED VALUE. (NOTE) Elevated high sensitivity troponin I (hsTnI) values and significant  changes across serial measurements may suggest ACS but many other  chronic and acute conditions are known to elevate hsTnI results.  Refer to the Links section for chest pain algorithms and additional  guidance. Performed at [facility], [place] [address] [place], [place] [address]   Resp Panel by RT-PCR (Flu A&B, Covid) Nasopharyngeal Swab     Status: None   Collection  Time: 09/03/21 10:11 PM   Specimen: Nasopharyngeal Swab; Nasopharyngeal(NP) swabs in vial transport medium  Result Value Ref Range   SARS Coronavirus 2 by RT PCR NEGATIVE NEGATIVE    Comment:  (NOTE) SARS-CoV-2 target nucleic acids are NOT DETECTED.  The SARS-CoV-2 RNA is generally detectable in upper respiratory specimens during the acute phase of infection. The lowest concentration of SARS-CoV-2 viral copies this assay can detect is 138 copies/mL. A negative result does not preclude SARS-Cov-2 infection and should not be used as the sole basis for treatment or other patient management decisions. A negative result may occur with  improper specimen collection/handling, submission of specimen other than nasopharyngeal swab, presence of viral mutation(s) within the areas targeted by this assay, and inadequate number of viral copies(<138 copies/mL). A negative result must be combined with clinical observations, patient history, and epidemiological information. The expected result is Negative.  Fact Sheet for Patients:  EntrepreneurPulse.com.au  Fact Sheet for Healthcare Providers:  IncredibleEmployment.be  This test is no t yet approved or cleared by the [place] FDA and  has been authorized for detection and/or diagnosis of SARS-CoV-2 by FDA under an Emergency Use Authorization (EUA). This EUA will remain  in effect (meaning this test can be used) for the duration of the COVID-19 declaration under Section 564(b)(1) of the Act, 21 U.S.C.section 360bbb-3(b)(1), unless the authorization is terminated  or revoked sooner.       Influenza A by PCR NEGATIVE NEGATIVE   Influenza B by PCR NEGATIVE NEGATIVE    Comment: (NOTE) The Xpert Xpress SARS-CoV-2/FLU/RSV plus assay is intended as an aid in the diagnosis of influenza from Nasopharyngeal swab specimens and should not be used as a sole basis for treatment. Nasal washings and aspirates are unacceptable for Xpert Xpress SARS-CoV-2/FLU/RSV testing.  Fact Sheet for Patients: EntrepreneurPulse.com.au  Fact Sheet for Healthcare  Providers: IncredibleEmployment.be  This test is not yet approved or cleared by the [place] FDA and has been authorized for detection and/or diagnosis of SARS-CoV-2 by FDA under an Emergency Use Authorization (EUA). This EUA will remain in effect (meaning this test can be used) for the duration of the COVID-19 declaration under Section 564(b)(1) of the Act, 21 U.S.C. section 360bbb-3(b)(1), unless the authorization is terminated or revoked.  Performed at [facility], [place] [address] [place], [place] [address]   Heparin level (unfractionated)     Status: Abnormal   Collection Time: 09/04/21  3:47 AM  Result Value Ref Range   Heparin Unfractionated 0.11 (L) 0.30 - 0.70 IU/mL    Comment: (NOTE) The clinical reportable range upper limit is being lowered to >1.10 to align with the FDA approved guidance for the current laboratory assay.  If heparin results are below expected values, and patient dosage has  been confirmed, suggest follow up testing of antithrombin III levels. Performed at [facility], [place] [address] [place], [place] [address]   CBC     Status: Abnormal   Collection Time: 09/04/21  3:47 AM  Result Value Ref Range   WBC 7.7 4.0 - 10.5 K/uL   RBC 2.63 (L) 4.22 - 5.81 MIL/uL   Hemoglobin 8.4 (L) 13.0 - 17.0 g/dL   HCT 24.3 (L) 39.0 - 52.0 %   MCV 92.4 80.0 - 100.0 fL   MCH 31.9 26.0 - 34.0 pg   MCHC 34.6 30.0 - 36.0 g/dL   RDW 15.9 (H) 11.5 - 15.5 %   Platelets 236 150 - 400 K/uL  nRBC 0.0 0.0 - 0.2 %    Comment: Performed at [facility], [place] [address], [place], [place] [address]  Basic metabolic panel     Status: Abnormal   Collection Time: 09/04/21  3:47 AM  Result Value Ref Range   Sodium 135 135 - 145 mmol/L   Potassium 3.4 (L) 3.5 - 5.1 mmol/L   Chloride 98 98 - 111 mmol/L   CO2 19 (L) 22 - 32 mmol/L   Glucose, Bld 96 70 - 99 mg/dL    Comment: Glucose reference range applies only to samples taken after  fasting for at least 8 hours.   BUN 73 (H) 6 - 20 mg/dL   Creatinine, Ser 16.40 (H) 0.61 - 1.24 mg/dL   Calcium 9.4 8.9 - 10.3 mg/dL   GFR, Estimated 3 (L) >60 mL/min    Comment: (NOTE) Calculated using the CKD-EPI Creatinine Equation (2021)    Anion gap 18 (H) 5 - 15    Comment: Performed at [facility] [address], [place], [place] [address]  Lipid panel     Status: Abnormal   Collection Time: 09/04/21  3:47 AM  Result Value Ref Range   Cholesterol 126 0 - 200 mg/dL   Triglycerides 320 (H) <150 mg/dL   HDL 34 (L) >40 mg/dL   Total CHOL/HDL Ratio 3.7 RATIO   VLDL 64 (H) 0 - 40 mg/dL   LDL Cholesterol 28 0 - 99 mg/dL    Comment:        Total Cholesterol/HDL:CHD Risk Coronary Heart Disease Risk Table                     Men   Women  1/2 Average Risk   3.4   3.3  Average Risk       5.0   4.4  2 X Average Risk   9.6   7.1  3 X Average Risk  23.4   11.0        Use the calculated Patient Ratio above and the CHD Risk Table to determine the patient's CHD Risk.        ATP III CLASSIFICATION (LDL):  <100     mg/dL   Optimal  100-129  mg/dL   Near or Above                    Optimal  130-159  mg/dL   Borderline  160-189  mg/dL   High  >190     mg/dL   Very High Performed at [facility] [address] [place], [place] [address]     DG Chest Port 1 View  Result Date: 09/03/2021 CLINICAL DATA:  Chest pain. EXAM: PORTABLE CHEST 1 VIEW COMPARISON:  06/17/2021. FINDINGS: Mild enlargement of the cardiac silhouette. Pulmonary vascular congestion. Low lung volumes with mild bibasilar opacities. No visible pleural effusions or pneumothorax. No acute osseous abnormality. IMPRESSION: 1. Mild cardiomegaly and pulmonary vascular congestion. 2. Low lung volumes with mild bibasilar opacities, likely atelectasis. Electronically Signed   By: Margaretha Sheffield M.D.   On: 09/03/2021 18:36    Assessment/Plan **Chest pain:  per cardiology -- though perhaps demand ischemia with  SVT.  No current plans for [facility], but will follow.   **ESRD on PD:  PD tonight if remains admitted.  Labs don't look great but last Kt/V was good in 07/2021 and pt says adherent to treatment.   Use 50/50 1.5 and 2.5% dialysate.  Heparin added per home regimen.  Daily gentamicin to exit site.   **Anemia:  Hb 8.4 - was 10.2 on 11/2.  No report of frank bleeding.  Trend.  May need ESA.    **BMM:  Corr ca ok.  Phos binder recently increased - changed dose to reflect, follow here.   **HIV: on home meds  **HTN: on home meds, BP normal.   **HFpEF: appears euvolemic.    Justin Mend 09/04/2021, 8:50 AM

## 2021-09-04 NOTE — Interval H&P Note (Signed)
History and Physical Interval Note:  09/04/2021 4:16 PM  Albert Eaton  has presented today for surgery, with the diagnosis of chest pain.  The various methods of treatment have been discussed with the patient and family. After consideration of risks, benefits and other options for treatment, the patient has consented to  Procedure(s): RIGHT/LEFT HEART CATH AND CORONARY ANGIOGRAPHY (N/A) as a surgical intervention.  The patient's history has been reviewed, patient examined, no change in status, stable for surgery.  I have reviewed the patient's chart and labs.  Questions were answered to the patient's satisfaction.    Cath Lab Visit (complete for each Cath Lab visit)  Clinical Evaluation Leading to the Procedure:   ACS: No.  Non-ACS:    Anginal Classification: CCS III  Anti-ischemic medical therapy: Minimal Therapy (1 class of medications)  Non-Invasive Test Results: No non-invasive testing performed  Prior CABG: No previous CABG        Lauree Chandler

## 2021-09-04 NOTE — Progress Notes (Signed)
Mobility Specialist Progress Note    09/04/21 1110  Mobility  Activity Ambulated in hall  Level of Assistance Independent after set-up  Assistive Device None  Distance Ambulated (ft) 480 ft  Mobility Ambulated independently in hallway  Mobility Response Tolerated well  Mobility performed by Mobility specialist  $Mobility charge 1 Mobility   Pt received in bed and agreeable. No complaints on walk. Returned to bed with call bell in reach.  Hildred Alamin Mobility Specialist  Mobility Specialist Phone: [phone]

## 2021-09-04 NOTE — Progress Notes (Addendum)
Mr Gemma had a 9 bt Vtach, BP at 139/93. Asymptomatic. Pt  K  level at 3.4.  Dr. Bonner Puna updated via secure chat

## 2021-09-04 NOTE — Progress Notes (Addendum)
Progress Note  Patient Name: Albert Eaton Date of Encounter: 09/04/2021  [facility] Cardiologist: [name], MD   Subjective   No chest pain overnight. Did have intermittent palpitations.   Inpatient Medications    Scheduled Meds:  amLODipine  10 mg Oral Q1200   aspirin EC  81 mg Oral q AM   atorvastatin  40 mg Oral Daily   bictegravir-emtricitabine-tenofovir AF  1 tablet Oral Daily   calcitRIOL  0.5 mcg Oral Q M,W,F   hydrALAZINE  100 mg Oral TID   levothyroxine  25 mcg Oral QAC breakfast   metoprolol tartrate  25 mg Oral BID   multivitamin  1 tablet Oral QHS   nitroGLYCERIN  1 inch Topical Q6H   sevelamer carbonate  1,600 mg Oral TID WC   sodium chloride flush  3 mL Intravenous Q12H   Continuous Infusions:  sodium chloride     heparin 1,450 Units/hr (09/04/21 0637)   PRN Meds: sodium chloride, acetaminophen **OR** acetaminophen, albuterol, sodium chloride flush   Vital Signs    Vitals:   09/03/21 2227 09/03/21 2300 09/04/21 0021 09/04/21 0732  BP: (!) 167/104 (!) 133/99 130/86 (!) 142/88  Pulse: 94 80 79 80  Resp: (!) 21 19 18    Temp:  98.8 F (37.1 C) 98.1 F (36.7 C) 98.2 F (36.8 C)  TempSrc:  Oral Oral Oral  SpO2: 97% 96% 97% 97%  Weight:   93.7 kg   Height:   5' 10"  (1.778 m)     Intake/Output Summary (Last 24 hours) at 09/04/2021 0930 Last data filed at 09/04/2021 0500 Gross per 24 hour  Intake 137.43 ml  Output --  Net 137.43 ml   Last 3 Weights 09/04/2021 09/03/2021 08/12/2021  Weight (lbs) 206 lb 8 oz 197 lb 209 lb  Weight (kg) 93.668 kg 89.359 kg 94.802 kg      Telemetry    SR, episode of SVT, 9 beats of NSVT - Personally Reviewed  ECG    No new tracing   Physical Exam   GEN: No acute distress.   Neck: No JVD Cardiac: RRR, + systolic murmur, no rubs, or gallops.  Respiratory: Clear to auscultation bilaterally. GI: Soft, nontender, non-distended.  MS: No edema; No deformity. Neuro:  Nonfocal  Psych: Normal affect    Labs    High Sensitivity Troponin:   Recent Labs  Lab 09/03/21 1732 09/03/21 2032  TROPONINIHS 108* 109*     Chemistry Recent Labs  Lab 09/03/21 1732 09/03/21 1739 09/04/21 0347  NA 138 137 135  K 3.4* 3.3* 3.4*  CL 96* 102 98  CO2 22  --  19*  GLUCOSE 86 86 96  BUN 67* 60* 73*  CREATININE 16.14* >18.00* 16.40*  CALCIUM 10.3  --  9.4  PROT 7.9  --   --   ALBUMIN 4.2  --   --   AST 22  --   --   ALT 15  --   --   ALKPHOS 73  --   --   BILITOT 0.8  --   --   GFRNONAA 3*  --  3*  ANIONGAP 20*  --  18*    Lipids  Recent Labs  Lab 09/04/21 0347  CHOL 126  TRIG 320*  HDL 34*  LDLCALC 28  CHOLHDL 3.7    Hematology Recent Labs  Lab 09/03/21 1732 09/03/21 1739 09/04/21 0347  WBC 8.2  --  7.7  RBC 3.46*  --  2.63*  HGB 11.1* 11.2* 8.4*  HCT 31.4* 33.0* 24.3*  MCV 90.8  --  92.4  MCH 32.1  --  31.9  MCHC 35.4  --  34.6  RDW 15.8*  --  15.9*  PLT 257  --  236   Thyroid  Recent Labs  Lab 09/03/21 1813  TSH 1.870    BNPNo results for input(s): BNP, PROBNP in the last 168 hours.  DDimer No results for input(s): DDIMER in the last 168 hours.   Radiology    DG Chest Port 1 View  Result Date: 09/03/2021 CLINICAL DATA:  Chest pain. EXAM: PORTABLE CHEST 1 VIEW COMPARISON:  06/17/2021. FINDINGS: Mild enlargement of the cardiac silhouette. Pulmonary vascular congestion. Low lung volumes with mild bibasilar opacities. No visible pleural effusions or pneumothorax. No acute osseous abnormality. IMPRESSION: 1. Mild cardiomegaly and pulmonary vascular congestion. 2. Low lung volumes with mild bibasilar opacities, likely atelectasis. Electronically Signed   By: Margaretha Sheffield M.D.   On: 09/03/2021 18:36    Cardiac Studies   Echo: 03/04/21  IMPRESSIONS     1. Left ventricular ejection fraction, by estimation, is 60 to 65%. The  left ventricle has normal function. The left ventricle has no regional  wall motion abnormalities. There is mild left ventricular  hypertrophy.  Left ventricular diastolic parameters  were normal.   2. Right ventricular systolic function is normal. The right ventricular  size is normal. There is mildly elevated pulmonary artery systolic  pressure.   3. Left atrial size was severely dilated.   4. Right atrial size was mildly dilated.   5. The mitral valve is normal in structure. Moderate to severe mitral  valve regurgitation. No evidence of mitral stenosis.   6. The aortic valve is normal in structure. Aortic valve regurgitation is  not visualized. No aortic stenosis is present.   7. There is borderline dilatation of the ascending aorta, measuring 39  mm.   8. The inferior vena cava is normal in size with greater than 50%  respiratory variability, suggesting right atrial pressure of 3 mmHg.   FINDINGS   Left Ventricle: Left ventricular ejection fraction, by estimation, is 60  to 65%. The left ventricle has normal function. The left ventricle has no  regional wall motion abnormalities. The left ventricular internal cavity  size was normal in size. There is   mild left ventricular hypertrophy. Left ventricular diastolic parameters  were normal.   Right Ventricle: The right ventricular size is normal. No increase in  right ventricular wall thickness. Right ventricular systolic function is  normal. There is mildly elevated pulmonary artery systolic pressure. The  tricuspid regurgitant velocity is 3.01   m/s, and with an assumed right atrial pressure of 8 mmHg, the estimated  right ventricular systolic pressure is 03.2 mmHg.   Left Atrium: Left atrial size was severely dilated.   Right Atrium: Right atrial size was mildly dilated.   Pericardium: There is no evidence of pericardial effusion.   Mitral Valve: The mitral valve is normal in structure. Moderate to severe  mitral valve regurgitation. No evidence of mitral valve stenosis.   Tricuspid Valve: The tricuspid valve is normal in structure. Tricuspid  valve  regurgitation is mild . No evidence of tricuspid stenosis.   Aortic Valve: The aortic valve is normal in structure. Aortic valve  regurgitation is not visualized. No aortic stenosis is present.   Pulmonic Valve: The pulmonic valve was normal in structure. Pulmonic valve  regurgitation is mild. No evidence of pulmonic  stenosis.   Aorta: The aortic root is normal in size and structure. There is  borderline dilatation of the ascending aorta, measuring 39 mm.   Venous: The inferior vena cava is normal in size with greater than 50%  respiratory variability, suggesting right atrial pressure of 3 mmHg.   IAS/Shunts: No atrial level shunt detected by color flow Doppler.   Patient Profile     55 y.o. male with PMH of D-CHF, ESRD on PD, nl nuc 2017, HIV (undetectable), HTN, hypothyroid, hep B (treated), OSA not on CPAP, nonobstructive CAD (coronary CT 09/2020) who is being seen 09/03/2021 for the evaluation of chest pain at the request of Dr Doren Custard.  Assessment & Plan    Chest pain: has an episode that radiated into his back, but improved with going to chiropractor. Episode yesterday started while at rest on the couch, then worsened while starting on PD. Stopped treatment early. He does report significant feelings of palpitations which developed just before the chest pain and persisted. He is active, does ADLs around the home without prior anginal symptoms. hsTn 108>>109, slightly higher than prior readings of 60 in the past. No further episodes of chest pain since admission has had atrial tach and NSVT without significant chest pain. Chest pain seem atypical in nature, suspect related to rhythm. -- on ASA, statin, IV heparin -- will review further with MD regarding plan  Palpitations: hx of atrial tachycardia. Was evaluated by EP back 3/22 with recommendations for amiodarone if symptoms became more bothersome.  -- noted NSVT, SVT on telemetry but also in the setting of hypokalemia which is a chronic  issue for him -- appears was on coreg 46m BID PTA, now on metoprolol 27mBID on admission  ESRD on PD: has been on PD since 05/2021. Tolerating well.  -- per nephrology  Renal Cell CA S/p left nephrectomy: 01/2021 through [facility]. Following with renal transplant program with recent visit 08/15/21  Moderate to severe MR: evaluated by Dr. CoBurt Knack/2022 with recommendations for medical management.   HTN: initially elevated on admission, now improved. -- will transition back to home dose of coreg 2540mID   Hypokalemia: this is a chronic issue for him. K+ 3.4>>3.3>>3.4  OSA: on Cpap  HIV: on Biktarvy  For questions or updates, please contact [facility] consult [email or web address] for contact info under    Signed, LinReino BellisP  09/04/2021, 9:30 AM    Patient seen, examined. Available data reviewed. Agree with findings, assessment, and plan as outlined by LinReino BellisP.  On my exam, the patient is alert oriented and in no distress.  HEENT is normal, JVP is normal, lungs are clear bilaterally, heart is regular rate and rhythm with a 2/6 holosystolic murmur at the apex.  Abdomen is soft and nontender, extremities have no edema, skin is warm and dry with no rash.  The patient's troponin is elevated in a flat trend with values of 108 and 109.  He had an episode of chest tightness yesterday that prompted him to seek medical attention.  The pain was in the center of the chest and felt like a pressure.  His past history is complicated by the presence of end-stage renal disease on peritoneal dialysis.  He is undergoing evaluation for renal transplantation.  He has a known history of nonobstructive CAD by coronary CT angiography.  He is at high risk of disease progression in the setting of end-stage renal disease.  I have recommended that we proceed with  definitive evaluation with cardiac catheterization and possible PCI. I have reviewed the risks, indications, and alternatives to  cardiac catheterization, possible angioplasty, and stenting with the patient. Risks include but are not limited to bleeding, infection, vascular injury, stroke, myocardial infection, arrhythmia, kidney injury, radiation-related injury in the case of prolonged fluoroscopy use, emergency cardiac surgery, and death. The patient understands the risks of serious complication is 1-2 in 1601 with diagnostic cardiac cath and 1-2% or less with angioplasty/stenting.  The patient could be done via a femoral approach in the setting of end-stage renal disease.  Would continue beta-blockade and current medical therapy for treatment of his arrhythmia.  I reviewed his telemetry and this showed a short run of nonsustained VT.  He also had some short supraventricular runs.  [name], M.D. 09/04/2021 12:34 PM

## 2021-09-04 NOTE — Plan of Care (Signed)

## 2021-09-04 NOTE — Progress Notes (Signed)
   09/04/21 1230  Clinical Encounter Type  Visited With Patient and family together (nephew, Janet Berlin on the phone)  Visit Type Initial  Referral From Nurse  Consult/Referral To [place] responded to the consult request for an Advance Directive. The patient said he want his nephew, Janet Berlin to be his HCPOA. The patient requested this Chaplain to provided the A.D. education to his nephew, Legrand Como who was on speaker phone. The chaplain instructed the patient to inform his nurse once he had filled out the Advance Directive and ready for notary.  This note was prepared by Jeanine Luz, M.Div..  For questions please contact by phone [phone].

## 2021-09-04 NOTE — Progress Notes (Signed)
Admission   From ED via stretcher with RN. Continuous IV drip running. AV fistula and peritoneal cath assessed. Vitals and assessment complete. Patient oriented to unit and staff. Call bell within reach. I&O assessed. Placed on continuous cardiac monitoring. Pain and needs attended to. Will continue to monitor.

## 2021-09-04 NOTE — H&P (View-Only) (Signed)
Progress Note  Patient Name: Albert Eaton Date of Encounter: 09/04/2021  [facility] Cardiologist: [name], MD   Subjective   No chest pain overnight. Did have intermittent palpitations.   Inpatient Medications    Scheduled Meds:  amLODipine  10 mg Oral Q1200   aspirin EC  81 mg Oral q AM   atorvastatin  40 mg Oral Daily   bictegravir-emtricitabine-tenofovir AF  1 tablet Oral Daily   calcitRIOL  0.5 mcg Oral Q M,W,F   hydrALAZINE  100 mg Oral TID   levothyroxine  25 mcg Oral QAC breakfast   metoprolol tartrate  25 mg Oral BID   multivitamin  1 tablet Oral QHS   nitroGLYCERIN  1 inch Topical Q6H   sevelamer carbonate  1,600 mg Oral TID WC   sodium chloride flush  3 mL Intravenous Q12H   Continuous Infusions:  sodium chloride     heparin 1,450 Units/hr (09/04/21 0637)   PRN Meds: sodium chloride, acetaminophen **OR** acetaminophen, albuterol, sodium chloride flush   Vital Signs    Vitals:   09/03/21 2227 09/03/21 2300 09/04/21 0021 09/04/21 0732  BP: (!) 167/104 (!) 133/99 130/86 (!) 142/88  Pulse: 94 80 79 80  Resp: (!) 21 19 18    Temp:  98.8 F (37.1 C) 98.1 F (36.7 C) 98.2 F (36.8 C)  TempSrc:  Oral Oral Oral  SpO2: 97% 96% 97% 97%  Weight:   93.7 kg   Height:   5' 10"  (1.778 m)     Intake/Output Summary (Last 24 hours) at 09/04/2021 0930 Last data filed at 09/04/2021 0500 Gross per 24 hour  Intake 137.43 ml  Output --  Net 137.43 ml   Last 3 Weights 09/04/2021 09/03/2021 08/12/2021  Weight (lbs) 206 lb 8 oz 197 lb 209 lb  Weight (kg) 93.668 kg 89.359 kg 94.802 kg      Telemetry    SR, episode of SVT, 9 beats of NSVT - Personally Reviewed  ECG    No new tracing   Physical Exam   GEN: No acute distress.   Neck: No JVD Cardiac: RRR, + systolic murmur, no rubs, or gallops.  Respiratory: Clear to auscultation bilaterally. GI: Soft, nontender, non-distended.  MS: No edema; No deformity. Neuro:  Nonfocal  Psych: Normal affect    Labs    High Sensitivity Troponin:   Recent Labs  Lab 09/03/21 1732 09/03/21 2032  TROPONINIHS 108* 109*     Chemistry Recent Labs  Lab 09/03/21 1732 09/03/21 1739 09/04/21 0347  NA 138 137 135  K 3.4* 3.3* 3.4*  CL 96* 102 98  CO2 22  --  19*  GLUCOSE 86 86 96  BUN 67* 60* 73*  CREATININE 16.14* >18.00* 16.40*  CALCIUM 10.3  --  9.4  PROT 7.9  --   --   ALBUMIN 4.2  --   --   AST 22  --   --   ALT 15  --   --   ALKPHOS 73  --   --   BILITOT 0.8  --   --   GFRNONAA 3*  --  3*  ANIONGAP 20*  --  18*    Lipids  Recent Labs  Lab 09/04/21 0347  CHOL 126  TRIG 320*  HDL 34*  LDLCALC 28  CHOLHDL 3.7    Hematology Recent Labs  Lab 09/03/21 1732 09/03/21 1739 09/04/21 0347  WBC 8.2  --  7.7  RBC 3.46*  --  2.63*  HGB 11.1* 11.2* 8.4*  HCT 31.4* 33.0* 24.3*  MCV 90.8  --  92.4  MCH 32.1  --  31.9  MCHC 35.4  --  34.6  RDW 15.8*  --  15.9*  PLT 257  --  236   Thyroid  Recent Labs  Lab 09/03/21 1813  TSH 1.870    BNPNo results for input(s): BNP, PROBNP in the last 168 hours.  DDimer No results for input(s): DDIMER in the last 168 hours.   Radiology    DG Chest Port 1 View  Result Date: 09/03/2021 CLINICAL DATA:  Chest pain. EXAM: PORTABLE CHEST 1 VIEW COMPARISON:  06/17/2021. FINDINGS: Mild enlargement of the cardiac silhouette. Pulmonary vascular congestion. Low lung volumes with mild bibasilar opacities. No visible pleural effusions or pneumothorax. No acute osseous abnormality. IMPRESSION: 1. Mild cardiomegaly and pulmonary vascular congestion. 2. Low lung volumes with mild bibasilar opacities, likely atelectasis. Electronically Signed   By: Margaretha Sheffield M.D.   On: 09/03/2021 18:36    Cardiac Studies   Echo: 03/04/21  IMPRESSIONS     1. Left ventricular ejection fraction, by estimation, is 60 to 65%. The  left ventricle has normal function. The left ventricle has no regional  wall motion abnormalities. There is mild left ventricular  hypertrophy.  Left ventricular diastolic parameters  were normal.   2. Right ventricular systolic function is normal. The right ventricular  size is normal. There is mildly elevated pulmonary artery systolic  pressure.   3. Left atrial size was severely dilated.   4. Right atrial size was mildly dilated.   5. The mitral valve is normal in structure. Moderate to severe mitral  valve regurgitation. No evidence of mitral stenosis.   6. The aortic valve is normal in structure. Aortic valve regurgitation is  not visualized. No aortic stenosis is present.   7. There is borderline dilatation of the ascending aorta, measuring 39  mm.   8. The inferior vena cava is normal in size with greater than 50%  respiratory variability, suggesting right atrial pressure of 3 mmHg.   FINDINGS   Left Ventricle: Left ventricular ejection fraction, by estimation, is 60  to 65%. The left ventricle has normal function. The left ventricle has no  regional wall motion abnormalities. The left ventricular internal cavity  size was normal in size. There is   mild left ventricular hypertrophy. Left ventricular diastolic parameters  were normal.   Right Ventricle: The right ventricular size is normal. No increase in  right ventricular wall thickness. Right ventricular systolic function is  normal. There is mildly elevated pulmonary artery systolic pressure. The  tricuspid regurgitant velocity is 3.01   m/s, and with an assumed right atrial pressure of 8 mmHg, the estimated  right ventricular systolic pressure is 64.4 mmHg.   Left Atrium: Left atrial size was severely dilated.   Right Atrium: Right atrial size was mildly dilated.   Pericardium: There is no evidence of pericardial effusion.   Mitral Valve: The mitral valve is normal in structure. Moderate to severe  mitral valve regurgitation. No evidence of mitral valve stenosis.   Tricuspid Valve: The tricuspid valve is normal in structure. Tricuspid  valve  regurgitation is mild . No evidence of tricuspid stenosis.   Aortic Valve: The aortic valve is normal in structure. Aortic valve  regurgitation is not visualized. No aortic stenosis is present.   Pulmonic Valve: The pulmonic valve was normal in structure. Pulmonic valve  regurgitation is mild. No evidence of pulmonic  stenosis.   Aorta: The aortic root is normal in size and structure. There is  borderline dilatation of the ascending aorta, measuring 39 mm.   Venous: The inferior vena cava is normal in size with greater than 50%  respiratory variability, suggesting right atrial pressure of 3 mmHg.   IAS/Shunts: No atrial level shunt detected by color flow Doppler.   Patient Profile     55 y.o. male with PMH of D-CHF, ESRD on PD, nl nuc 2017, HIV (undetectable), HTN, hypothyroid, hep B (treated), OSA not on CPAP, nonobstructive CAD (coronary CT 09/2020) who is being seen 09/03/2021 for the evaluation of chest pain at the request of Dr Doren Custard.  Assessment & Plan    Chest pain: has an episode that radiated into his back, but improved with going to chiropractor. Episode yesterday started while at rest on the couch, then worsened while starting on PD. Stopped treatment early. He does report significant feelings of palpitations which developed just before the chest pain and persisted. He is active, does ADLs around the home without prior anginal symptoms. hsTn 108>>109, slightly higher than prior readings of 60 in the past. No further episodes of chest pain since admission has had atrial tach and NSVT without significant chest pain. Chest pain seem atypical in nature, suspect related to rhythm. -- on ASA, statin, IV heparin -- will review further with MD regarding plan  Palpitations: hx of atrial tachycardia. Was evaluated by EP back 3/22 with recommendations for amiodarone if symptoms became more bothersome.  -- noted NSVT, SVT on telemetry but also in the setting of hypokalemia which is a chronic  issue for him -- appears was on coreg 20m BID PTA, now on metoprolol 211mBID on admission  ESRD on PD: has been on PD since 05/2021. Tolerating well.  -- per nephrology  Renal Cell CA S/p left nephrectomy: 01/2021 through [facility]. Following with renal transplant program with recent visit 08/15/21  Moderate to severe MR: evaluated by Dr. CoBurt Knack/2022 with recommendations for medical management.   HTN: initially elevated on admission, now improved. -- will transition back to home dose of coreg 2590mID   Hypokalemia: this is a chronic issue for him. K+ 3.4>>3.3>>3.4  OSA: on Cpap  HIV: on Biktarvy  For questions or updates, please contact [facility] consult [email or web address] for contact info under    Signed, LinReino BellisP  09/04/2021, 9:30 AM    Patient seen, examined. Available data reviewed. Agree with findings, assessment, and plan as outlined by LinReino BellisP.  On my exam, the patient is alert oriented and in no distress.  HEENT is normal, JVP is normal, lungs are clear bilaterally, heart is regular rate and rhythm with a 2/6 holosystolic murmur at the apex.  Abdomen is soft and nontender, extremities have no edema, skin is warm and dry with no rash.  The patient's troponin is elevated in a flat trend with values of 108 and 109.  He had an episode of chest tightness yesterday that prompted him to seek medical attention.  The pain was in the center of the chest and felt like a pressure.  His past history is complicated by the presence of end-stage renal disease on peritoneal dialysis.  He is undergoing evaluation for renal transplantation.  He has a known history of nonobstructive CAD by coronary CT angiography.  He is at high risk of disease progression in the setting of end-stage renal disease.  I have recommended that we proceed with  definitive evaluation with cardiac catheterization and possible PCI. I have reviewed the risks, indications, and alternatives to  cardiac catheterization, possible angioplasty, and stenting with the patient. Risks include but are not limited to bleeding, infection, vascular injury, stroke, myocardial infection, arrhythmia, kidney injury, radiation-related injury in the case of prolonged fluoroscopy use, emergency cardiac surgery, and death. The patient understands the risks of serious complication is 1-2 in 2703 with diagnostic cardiac cath and 1-2% or less with angioplasty/stenting.  The patient could be done via a femoral approach in the setting of end-stage renal disease.  Would continue beta-blockade and current medical therapy for treatment of his arrhythmia.  I reviewed his telemetry and this showed a short run of nonsustained VT.  He also had some short supraventricular runs.  [name], M.D. 09/04/2021 12:34 PM

## 2021-09-04 NOTE — Progress Notes (Signed)
Returned from cath lab at 1744. Right femoral site CDI, no hematoma.  Post cath instruction given.

## 2021-09-05 ENCOUNTER — Encounter (HOSPITAL_COMMUNITY): Payer: Self-pay | Admitting: Cardiovascular Disease

## 2021-09-05 ENCOUNTER — Ambulatory Visit: Payer: Medicare Other

## 2021-09-05 DIAGNOSIS — R778 Other specified abnormalities of plasma proteins: Secondary | ICD-10-CM | POA: Diagnosis not present

## 2021-09-05 DIAGNOSIS — Z79899 Other long term (current) drug therapy: Secondary | ICD-10-CM | POA: Diagnosis not present

## 2021-09-05 DIAGNOSIS — I209 Angina pectoris, unspecified: Secondary | ICD-10-CM

## 2021-09-05 DIAGNOSIS — D509 Iron deficiency anemia, unspecified: Secondary | ICD-10-CM | POA: Diagnosis not present

## 2021-09-05 DIAGNOSIS — R079 Chest pain, unspecified: Secondary | ICD-10-CM | POA: Diagnosis not present

## 2021-09-05 DIAGNOSIS — D631 Anemia in chronic kidney disease: Secondary | ICD-10-CM | POA: Diagnosis not present

## 2021-09-05 DIAGNOSIS — Z992 Dependence on renal dialysis: Secondary | ICD-10-CM | POA: Diagnosis not present

## 2021-09-05 DIAGNOSIS — N186 End stage renal disease: Secondary | ICD-10-CM | POA: Diagnosis not present

## 2021-09-05 DIAGNOSIS — I214 Non-ST elevation (NSTEMI) myocardial infarction: Secondary | ICD-10-CM | POA: Diagnosis not present

## 2021-09-05 DIAGNOSIS — R82998 Other abnormal findings in urine: Secondary | ICD-10-CM | POA: Diagnosis not present

## 2021-09-05 DIAGNOSIS — I5032 Chronic diastolic (congestive) heart failure: Secondary | ICD-10-CM | POA: Diagnosis not present

## 2021-09-05 DIAGNOSIS — N2581 Secondary hyperparathyroidism of renal origin: Secondary | ICD-10-CM | POA: Diagnosis not present

## 2021-09-05 LAB — RENAL FUNCTION PANEL
Albumin: 3.7 g/dL (ref 3.5–5.0)
Anion gap: 18 — ABNORMAL HIGH (ref 5–15)
BUN: 81 mg/dL — ABNORMAL HIGH (ref 6–20)
CO2: 21 mmol/L — ABNORMAL LOW (ref 22–32)
Calcium: 9.2 mg/dL (ref 8.9–10.3)
Chloride: 94 mmol/L — ABNORMAL LOW (ref 98–111)
Creatinine, Ser: 16.93 mg/dL — ABNORMAL HIGH (ref 0.61–1.24)
GFR, Estimated: 3 mL/min — ABNORMAL LOW (ref >60)
Glucose, Bld: 109 mg/dL — ABNORMAL HIGH (ref 70–99)
Phosphorus: 8.8 mg/dL — ABNORMAL HIGH (ref 2.5–4.6)
Potassium: 3.9 mmol/L (ref 3.5–5.1)
Sodium: 133 mmol/L — ABNORMAL LOW (ref 135–145)

## 2021-09-05 LAB — CBC
HCT: 26.7 % — ABNORMAL LOW (ref 39.0–52.0)
Hemoglobin: 9.4 g/dL — ABNORMAL LOW (ref 13.0–17.0)
MCH: 32.1 pg (ref 26.0–34.0)
MCHC: 35.2 g/dL (ref 30.0–36.0)
MCV: 91.1 fL (ref 80.0–100.0)
Platelets: 213 10*3/uL (ref 150–400)
RBC: 2.93 MIL/uL — ABNORMAL LOW (ref 4.22–5.81)
RDW: 15.9 % — ABNORMAL HIGH (ref 11.5–15.5)
WBC: 6.4 10*3/uL (ref 4.0–10.5)
nRBC: 0 % (ref 0.0–0.2)

## 2021-09-05 MED ORDER — HEPARIN SODIUM (PORCINE) 5000 UNIT/ML IJ SOLN
5000.0000 [IU] | Freq: Three times a day (TID) | INTRAMUSCULAR | Status: DC
  Administered 2021-09-05: 5000 [IU] via SUBCUTANEOUS
  Filled 2021-09-05: qty 1

## 2021-09-05 MED ORDER — ISOSORBIDE MONONITRATE ER 30 MG PO TB24: 30.0000 mg | ORAL_TABLET | Freq: Every day | ORAL | 0 refills | Status: DC

## 2021-09-05 MED ORDER — ISOSORBIDE MONONITRATE ER 30 MG PO TB24
30.0000 mg | ORAL_TABLET | Freq: Every day | ORAL | Status: DC
  Administered 2021-09-05: 30 mg via ORAL
  Filled 2021-09-05: qty 1

## 2021-09-05 MED ORDER — PROCHLORPERAZINE EDISYLATE 10 MG/2ML IJ SOLN
10.0000 mg | Freq: Four times a day (QID) | INTRAMUSCULAR | Status: DC | PRN
  Administered 2021-09-05: 10 mg via INTRAVENOUS
  Filled 2021-09-05: qty 2

## 2021-09-05 MED ORDER — CALCIUM CARBONATE ANTACID 500 MG PO CHEW
1.0000 | CHEWABLE_TABLET | Freq: Once | ORAL | Status: AC
  Administered 2021-09-05: 200 mg via ORAL
  Filled 2021-09-05: qty 1

## 2021-09-05 MED ORDER — COVID-19MRNA BIVAL VACC PFIZER 30 MCG/0.3ML IM SUSP
0.3000 mL | Freq: Once | INTRAMUSCULAR | Status: DC
  Filled 2021-09-05: qty 0.3

## 2021-09-05 NOTE — Discharge Summary (Signed)
Physician Discharge Summary  Albert Eaton OYD:[id number] DOB: [birth year] DOA: 09/03/2021  PCP: Albert Dew, FNP  Admit date: 09/03/2021 Discharge date: 09/05/2021  Admitted From: Home Disposition: Home   Recommendations for Outpatient Follow-up:  Follow up with PCP in 1-2 weeks Follow up with cardiology for management of nonobstructive CAD If chest pain recurs, consider further evaluations  Continue routine PD and nephrology follow up. Consider ESA for AOCKD.  Home Health: None Equipment/Devices: None new Discharge Condition: Stable CODE STATUS: Full Diet recommendation: Heart healthy, renal  Brief/Interim Summary: Albert Eaton is a 55 y.o. male with medical history significant for HFpEF, ESRD on PD, nl nuc 2017, HIV (undetectable), HTN, hypothyroid, hep B (treated), OSA (uses CPAP intermittently) who presents for evaluation of chest pain.  He reports that this afternoon he started his peritoneal dialysis and shortly thereafter developed chest pain while at rest.  He reports the chest pain was in the substernal region and felt like a pressure that was a 7 out of 10 at its worst.  He states the pain did not radiate but was associated with shortness of breath and nausea.  He did not have any vomiting.  He denies any fever or chills.  He was given nitroglycerin sublingual and aspirin by EMS and chest pain improved.  When he arrived in the emergency room nitroglycerin paste was placed to chest wall.  He reports chest pressure is now a 1-2 out of 10.    ED Course: [name] has been hemodynamically stable in the emergency room.  He has been evaluated by cardiology.  Initial troponin was elevated at 108.  Started on heparin infusion for ACS.  Lab work was unremarkable CBC.  Initial troponin 108 and repeat troponin if you are later 109.  Sodium 138 potassium 3.4 chloride 96 bicarb 22 creatinine 16.14 BUN 67 calcium 10.3 albumin 4.2 alkaline phosphatase 73 AST 22 ALT 15, troponin 1.870.   COVID swab pending.  Hospitalist service been asked to admit patient for further management and evaluation  Hospital Course: He was taken to the cath lab where mild-moderate nonobstructive coronary artery disease was found. Imdur was started in addition to home medications for continued medical management of CAD. Nephrology oversaw routine PD while admitted.   Discharge Diagnoses:  Principal Problem:   NSTEMI (non-ST elevated myocardial infarction) ([place]) Active Problems:   HIV disease ([facility])   OSA (obstructive sleep apnea)   Mixed hyperlipidemia   End stage renal disease (HCC)   Chronic diastolic (congestive) heart failure (HCC)   Essential hypertension   Chest pain of uncertain etiology   Elevated troponin  Chest pain, nonobstructive CAD: - Continue ASA 59m, beta blocker, statin - Start imdur per cardiology for antianginal effect to to improve BP control - Continue high intensity statin with rosuvastatin 117m- Cleared for discharge from cardiology's perspective  HTN:  - Continue norvasc, coreg, hydralazine, and now imdur.    Chronic HFpEF:  - Meds as ordered, volume per nephrology. Appears euvolemic currently.    ESRD:  - Nephrology consulted, guided PD. Though labs were abnormal, reportedly adequate PD evidenced by Kt/V previously.  - Renal transplant process underway elsewhere.   Paroxysmal atrial tachycardia: NSR currently. Conservative therapy recommended by prior EP evaluation. Asymptomatic.  - Continue coreg  HLD:  - Continue high intensity statin   OSA:  - CPAP qHS   HIV disease: Undetectable VL, CD4 always 600+ - Continue ART  CARDIAC CATHETERIZATION   Result Date: 09/04/2021   Prox  RCA lesion is 50% stenosed.   Mid LAD lesion is 50% stenosed.   Prox LAD lesion is 40% stenosed.   2nd Mrg lesion is 30% stenosed. Mild to moderate non-obstructive CAD Normal right and left heart pressures Continue medical management of CAD.   Discharge Instructions Discharge  Instructions     Discharge instructions   Complete by: As directed    You were admitted for episodes of chest discomfort, palpitations and worked up with a cardiac catheterization that showed some coronary artery disease without a critical narrowing. Increasing treatment to reduce your risk for heart attack is recommended by cardiology. Continue taking crestor 110m daily (to lower cholesterol) and start taking imdur daily. Imdur may help with the chest discomfort and bring your blood pressure under better control. No other changes to medications are recommended, continue taking what you were taking. Nephrology and cardiology, Dr. CBurt Knack have cleared you for discharge today. You will need to follow up with cardiology if symptoms return or seek medical attention right away if you experience chest pain, shortness of breath.   No wound care   Complete by: As directed       Allergies as of 09/05/2021       Reactions   Ace Inhibitors Cough, Other (See Comments)   Lisinopril Cough        Medication List     STOP taking these medications    oxyCODONE-acetaminophen 10-325 MG tablet Commonly known as: Percocet       TAKE these medications    Acetaminophen Extra Strength 500 MG tablet Generic drug: acetaminophen Take 500 mg by mouth every 6 (six) hours as needed (pain.).   allopurinol 100 MG tablet Commonly known as: ZYLOPRIM TAKE 1 TABLET(100 MG) BY MOUTH TWICE DAILY FOR 60 DOSES   amLODipine 10 MG tablet Commonly known as: NORVASC Take 1 tablet (10 mg total) by mouth daily. What changed:  when to take this additional instructions   aspirin EC 81 MG tablet Take 1 tablet (81 mg total) by mouth daily. Swallow whole. What changed: when to take this   azelastine 0.1 % nasal spray Commonly known as: ASTELIN PLACE 2 SPRAYS INTO EACH NOSTRIL TWICE DAILY AS NEEDED FOR RHINITIS AS DIRECTED What changed: See the new instructions.   Biktarvy 50-200-25 MG Tabs tablet Generic drug:  bictegravir-emtricitabine-tenofovir AF TAKE 1 TABLET BY MOUTH DAILY   calcitRIOL 0.5 MCG capsule Commonly known as: ROCALTROL Take 0.5 mcg by mouth every Monday, Wednesday, and Friday.   carvedilol 25 MG tablet Commonly known as: COREG TAKE 1 TABLET BY MOUTH TWICE DAILY WITH FOOD   chlorproMAZINE 10 MG tablet Commonly known as: THORAZINE Take 1 tablet (10 mg total) by mouth 3 (three) times daily as needed for hiccoughs. What changed: when to take this   clobetasol 0.05 % topical foam Commonly known as: OLUX Apply topically 2 (two) times daily.   clonazePAM 0.5 MG tablet Commonly known as: KlonoPIN Take 1 tablet (0.5 mg total) by mouth at bedtime.   clotrimazole-betamethasone cream Commonly known as: LOTRISONE Apply topically 2 (two) times daily. X 3-5 days. IF symptoms continue, call office What changed:  how much to take when to take this reasons to take this additional instructions   docusate sodium 100 MG capsule Commonly known as: COLACE Take 100 mg by mouth 2 (two) times daily.   Flovent HFA 110 MCG/ACT inhaler Generic drug: fluticasone INHALE 2 PUFFS INTO THE LUNGS TWICE DAILY   gabapentin 300 MG capsule Commonly known as:  NEURONTIN TAKE 1 CAPSULE(300 MG) BY MOUTH THREE TIMES DAILY What changed: See the new instructions.   gentamicin cream 0.1 % Commonly known as: GARAMYCIN Apply 1 application topically See admin instructions. Apply when catheter is flushed and cleaned   hydrALAZINE 100 MG tablet Commonly known as: APRESOLINE Take 1 tablet (100 mg total) by mouth 3 (three) times daily.   hyoscyamine 0.125 MG SL tablet Commonly known as: LEVSIN SL Place 1-2 tablets (0.125-0.25 mg total) under the tongue every 8 (eight) hours as needed (lower abdominal pain, spasm).   ipratropium 0.06 % nasal spray Commonly known as: ATROVENT USE 2 SPRAYS IN EACH NOSTRIL 2 TO 3 TIMES DAILY AS NEEDED What changed:  how much to take how to take this when to take  this reasons to take this additional instructions   isosorbide mononitrate 30 MG 24 hr tablet Commonly known as: IMDUR Take 1 tablet (30 mg total) by mouth daily.   lactulose 10 GM/15ML solution Commonly known as: CHRONULAC Take 20 g by mouth daily as needed for moderate constipation.   levothyroxine 25 MCG tablet Commonly known as: SYNTHROID TAKE 1 TABLET BY MOUTH DAILY BEFORE BREAKFAST   lidocaine-prilocaine cream Commonly known as: EMLA Apply 1 application topically See admin instructions. Apply to access site 1-2 hours before dialysis   lubiprostone 24 MCG capsule Commonly known as: AMITIZA TAKE 1 CAPSULE(24 MCG) BY MOUTH TWICE DAILY WITH A MEAL What changed: See the new instructions.   methocarbamol 500 MG tablet Commonly known as: ROBAXIN Take 1 tablet (500 mg total) by mouth every 8 (eight) hours as needed for muscle spasms. What changed: when to take this   MIRCERA IJ Inject into the skin.   multivitamin Tabs tablet Take 1 tablet by mouth at bedtime.   mupirocin ointment 2 % Commonly known as: BACTROBAN Apply 1 application topically daily as needed (apply to port after dialysis).   pantoprazole 40 MG tablet Commonly known as: PROTONIX TAKE 1 TABLET(40 MG) BY MOUTH DAILY   Pazeo 0.7 % Soln Generic drug: Olopatadine HCl Place 1 drop into both eyes daily. What changed:  when to take this reasons to take this   predniSONE 10 MG tablet Commonly known as: DELTASONE Take 1 tablet (10 mg total) by mouth daily with breakfast.   ProAir HFA 108 (90 Base) MCG/ACT inhaler Generic drug: albuterol INHALE 2 PUFFS INTO THE LUNGS EVERY 4 HOURS AS NEEDED FOR WHEEZING OR SHORTNESS OF BREATH   rOPINIRole 0.5 MG tablet Commonly known as: REQUIP Take 0.5 mg by mouth at bedtime.   rOPINIRole 1 MG tablet Commonly known as: Requip Take 1 tablet (1 mg total) by mouth at bedtime.   rosuvastatin 10 MG tablet Commonly known as: CRESTOR Take 1 tablet (10 mg total) by  mouth daily. What changed: when to take this   senna 8.6 MG Tabs tablet Commonly known as: SENOKOT Take 2 tablets by mouth every evening.   sevelamer carbonate 800 MG tablet Commonly known as: RENVELA Take 1,600 mg by mouth 3 (three) times daily with meals.   Spacer/Aero-Holding Dorise Bullion 1 Device by Does not apply route 2 (two) times a day.   Venofer 20 MG/ML injection Generic drug: iron sucrose Inject into the vein.   Vitamin D (Ergocalciferol) 1.25 MG (50000 UNIT) Caps capsule Commonly known as: DRISDOL TAKE 1 CAPSULE BY MOUTH EVERY 7 DAYS What changed:  when to take this additional instructions        Follow-up [place],  Asencion Partridge, FNP Follow up.   Specialty: Family Medicine Contact information: [address]. [place] [address] [phone]         [name], MD .   Specialty: Cardiology Contact information: [phone] [address] [place] [address] [phone]         Vickie Epley, MD .   Specialties: Cardiology, Radiology Contact information: [address] [place] [address] [phone]                Allergies  Allergen Reactions   Ace Inhibitors Cough and Other (See Comments)   Lisinopril Cough    Consultations: Cardiology Nephrology  Procedures/Studies: CARDIAC CATHETERIZATION  Result Date: 09/04/2021   Prox RCA lesion is 50% stenosed.   Mid LAD lesion is 50% stenosed.   Prox LAD lesion is 40% stenosed.   2nd Mrg lesion is 30% stenosed. Mild to moderate non-obstructive CAD Normal right and left heart pressures Continue medical management of CAD.   DG Chest Port 1 View  Result Date: 09/03/2021 CLINICAL DATA:  Chest pain. EXAM: PORTABLE CHEST 1 VIEW COMPARISON:  06/17/2021. FINDINGS: Mild enlargement of the cardiac silhouette. Pulmonary vascular congestion. Low lung volumes with mild bibasilar opacities. No visible pleural effusions or pneumothorax. No acute  osseous abnormality. IMPRESSION: 1. Mild cardiomegaly and pulmonary vascular congestion. 2. Low lung volumes with mild bibasilar opacities, likely atelectasis. Electronically Signed   By: Margaretha Sheffield M.D.   On: 09/03/2021 18:36    Subjective: Had an episode of chest discomfort last night associated with nausea and spitting up which resolved. This morning he feels well and is requesting discharge.   Discharge Exam: Vitals:   09/05/21 0827 09/05/21 0829  BP: (!) 148/94   Pulse:  95  Resp:    Temp:    SpO2:     General: Pt is alert, awake, not in acute distress Cardiovascular: RRR, II/VI apical systolic murmur, stable.  Respiratory: CTA bilaterally, no wheezing, no rhonchi Abdominal: Soft, NT, ND, bowel sounds + Extremities: No pitting edema, no cyanosis. R femoral site without ecchymosis Skin: PD cath site c/d/i  Labs: BNP (last 3 results) No results for input(s): BNP in the last 8760 hours. Basic Metabolic Panel: Recent Labs  Lab 09/03/21 1732 09/03/21 1739 09/04/21 0347 09/04/21 1641 09/04/21 1646 09/05/21 0234  NA 138 137 135 136 137 133*  K 3.4* 3.3* 3.4* 4.2 4.0 3.9  CL 96* 102 98  --   --  94*  CO2 22  --  19*  --   --  21*  GLUCOSE 86 86 96  --   --  109*  BUN 67* 60* 73*  --   --  81*  CREATININE 16.14* >18.00* 16.40*  --   --  16.93*  CALCIUM 10.3  --  9.4  --   --  9.2  PHOS  --   --   --   --   --  8.8*   Liver Function Tests: Recent Labs  Lab 09/03/21 1732 09/05/21 0234  AST 22  --   ALT 15  --   ALKPHOS 73  --   BILITOT 0.8  --   PROT 7.9  --   ALBUMIN 4.2 3.7   No results for input(s): LIPASE, AMYLASE in the last 168 hours. No results for input(s): AMMONIA in the last 168 hours. CBC: Recent Labs  Lab 09/03/21 1732 09/03/21 1739 09/04/21 0347 09/04/21 1641 09/04/21 1646 09/05/21 0234  WBC 8.2  --  7.7  --   --  6.4  HGB 11.1* 11.2* 8.4* 8.8* 8.5* 9.4*  HCT 31.4* 33.0* 24.3* 26.0* 25.0* 26.7*  MCV 90.8  --  92.4  --   --  91.1  PLT  257  --  236  --   --  213   Cardiac Enzymes: No results for input(s): CKTOTAL, CKMB, CKMBINDEX, TROPONINI in the last 168 hours. BNP: Invalid input(s): POCBNP CBG: No results for input(s): GLUCAP in the last 168 hours. D-Dimer No results for input(s): DDIMER in the last 72 hours. Hgb A1c No results for input(s): HGBA1C in the last 72 hours. Lipid Profile Recent Labs    09/04/21 0347  CHOL 126  HDL 34*  LDLCALC 28  TRIG 320*  CHOLHDL 3.7   Thyroid function studies Recent Labs    09/03/21 1813  TSH 1.870   Anemia work up No results for input(s): VITAMINB12, FOLATE, FERRITIN, TIBC, IRON, RETICCTPCT in the last 72 hours. Urinalysis    Component Value Date/Time   COLORURINE YELLOW 07/16/2021 2209   APPEARANCEUR CLEAR 07/16/2021 2209   LABSPEC 1.025 07/16/2021 2209   PHURINE 5.5 07/16/2021 2209   GLUCOSEU NEGATIVE 07/16/2021 2209   HGBUR NEGATIVE 07/16/2021 2209   BILIRUBINUR NEGATIVE 07/16/2021 2209   BILIRUBINUR neg 12/27/2019 0913   KETONESUR NEGATIVE 07/16/2021 2209   PROTEINUR >300 (A) 07/16/2021 2209   UROBILINOGEN 0.2 12/27/2019 0913   UROBILINOGEN 0.2 01/15/2018 1140   NITRITE NEGATIVE 07/16/2021 2209   LEUKOCYTESUR TRACE (A) 07/16/2021 2209    Microbiology Recent Results (from the past 240 hour(s))  Resp Panel by RT-PCR (Flu A&B, Covid) Nasopharyngeal Swab     Status: None   Collection Time: 09/03/21 10:11 PM   Specimen: Nasopharyngeal Swab; Nasopharyngeal(NP) swabs in vial transport medium  Result Value Ref Range Status   SARS Coronavirus 2 by RT PCR NEGATIVE NEGATIVE Final   Influenza A by PCR NEGATIVE NEGATIVE Final   Influenza B by PCR NEGATIVE NEGATIVE Final    Time coordinating discharge: Approximately 40 minutes  Patrecia Pour, MD  [facility] 09/05/2021, 10:19 AM

## 2021-09-05 NOTE — Consult Note (Signed)
[facility] Inpatient Consult   09/05/2021  Albert Eaton 01-Sep-1966 [id number]  [place] [facility] CM)   Patient chart reviewed due to high unplanned readmission risk score for possible [facility] CM post hospital care coordination needs.   Spoke with patient bedside. HIPAA verified. Stated that he uses pharmacy home delivery program and that the medications may take a week for arrival. Explained [facility] CM program and provided brochure. Patient requested a list of potential alternate pharmacy providers for consideration. [facility] CM referral to be made for post hospital assistance. Denies any other needs.  Of note, [facility] Care Management services does not replace or interfere with any services that are arranged by inpatient case management or social work.   Netta Cedars, MSN, RN [facility] [employer] [phone]  Toll free office [phone]

## 2021-09-05 NOTE — Progress Notes (Addendum)
[facility] Progress Note   Subjective:   had some recurrent CP and palpitations overnight.  Reports nothing detected on tele/on monitor but I don't have confirmation of that.  Had Laurium yesterday via femoral approach, site sore as expected.  No PCI needed.  NO issues with PD overnight had 545m UF with 1.5/2.5% combo.  Objective Vitals:   09/05/21 0148 09/05/21 0206 09/05/21 0235 09/05/21 0510  BP: 134/90 130/83 122/88 108/72  Pulse: 89 82 91 94  Resp:    15  Temp:    98 F (36.7 C)  TempSrc:    Oral  SpO2: 97% 97% 96% 96%  Weight:   94.5 kg 93.6 kg  Height:       Physical Exam Gen: appears well lying in bed  Eyes: anicteric ENT: MMM Neck: no JVD CV:  RRR, no rub Abd:  soft, nontender, R mid abd PD catheter dressing c/d/i Lungs: clear GU: no foley Extr:  no edema, LUE AVF +t/b Neuro: nonfocal Skin: no rashes  Additional Objective Labs: Basic Metabolic Panel: Recent Labs  Lab 09/03/21 1732 09/03/21 1739 09/04/21 0347 09/04/21 1641 09/04/21 1646 09/05/21 0234  NA 138 137 135 136 137 133*  K 3.4* 3.3* 3.4* 4.2 4.0 3.9  CL 96* 102 98  --   --  94*  CO2 22  --  19*  --   --  21*  GLUCOSE 86 86 96  --   --  109*  BUN 67* 60* 73*  --   --  81*  CREATININE 16.14* >18.00* 16.40*  --   --  16.93*  CALCIUM 10.3  --  9.4  --   --  9.2  PHOS  --   --   --   --   --  8.8*   Liver Function Tests: Recent Labs  Lab 09/03/21 1732 09/05/21 0234  AST 22  --   ALT 15  --   ALKPHOS 73  --   BILITOT 0.8  --   PROT 7.9  --   ALBUMIN 4.2 3.7   No results for input(s): LIPASE, AMYLASE in the last 168 hours. CBC: Recent Labs  Lab 09/03/21 1732 09/03/21 1739 09/04/21 0347 09/04/21 1641 09/04/21 1646 09/05/21 0234  WBC 8.2  --  7.7  --   --  6.4  HGB 11.1*   < > 8.4* 8.8* 8.5* 9.4*  HCT 31.4*   < > 24.3* 26.0* 25.0* 26.7*  MCV 90.8  --  92.4  --   --  91.1  PLT 257  --  236  --   --  213   < > = values in this interval not displayed.   Blood  Culture    Component Value Date/Time   SDES PERITONEAL DIALYSATE 06/17/2021 1746   SDES PERITONEAL DIALYSATE 06/17/2021 1746   SPECREQUEST BOTTLES DRAWN AEROBIC AND ANAEROBIC 06/17/2021 1746   SPECREQUEST NONE 06/17/2021 1746   CULT  06/17/2021 1746    NO GROWTH 5 DAYS [facility] at [facility] [id number], [place] [address]   REPTSTATUS 06/22/2021 FINAL 06/17/2021 1746   REPTSTATUS 06/18/2021 FINAL 06/17/2021 1746    Cardiac Enzymes: No results for input(s): CKTOTAL, CKMB, CKMBINDEX, TROPONINI in the last 168 hours. CBG: No results for input(s): GLUCAP in the last 168 hours. Iron Studies: No results for input(s): IRON, TIBC, TRANSFERRIN, FERRITIN in the last 72 hours. @lablastinr3 @ Studies/Results: CARDIAC CATHETERIZATION  Result Date: 09/04/2021   Prox RCA lesion is  50% stenosed.   Mid LAD lesion is 50% stenosed.   Prox LAD lesion is 40% stenosed.   2nd Mrg lesion is 30% stenosed. Mild to moderate non-obstructive CAD Normal right and left heart pressures Continue medical management of CAD.   DG Chest Port 1 View  Result Date: 09/03/2021 CLINICAL DATA:  Chest pain. EXAM: PORTABLE CHEST 1 VIEW COMPARISON:  06/17/2021. FINDINGS: Mild enlargement of the cardiac silhouette. Pulmonary vascular congestion. Low lung volumes with mild bibasilar opacities. No visible pleural effusions or pneumothorax. No acute osseous abnormality. IMPRESSION: 1. Mild cardiomegaly and pulmonary vascular congestion. 2. Low lung volumes with mild bibasilar opacities, likely atelectasis. Electronically Signed   By: Margaretha Sheffield M.D.   On: 09/03/2021 18:36   Medications:  sodium chloride     sodium chloride     dialysis solution 1.5% low-MG/low-CA     dialysis solution 2.5% low-MG/low-CA      amLODipine  10 mg Oral Q1200   aspirin EC  81 mg Oral q AM   atorvastatin  40 mg Oral Daily   bictegravir-emtricitabine-tenofovir AF  1 tablet Oral Daily   calcitRIOL  0.5 mcg Oral Q M,W,F    carvedilol  25 mg Oral BID WC   gentamicin cream  1 application Topical Daily   heparin injection (subcutaneous)  5,000 Units Subcutaneous Q8H   hydrALAZINE  100 mg Oral TID   influenza vac split quadrivalent PF  0.5 mL Intramuscular Tomorrow-1000   levothyroxine  25 mcg Oral QAC breakfast   multivitamin  1 tablet Oral QHS   nitroGLYCERIN  1 inch Topical Q6H   sevelamer carbonate  3,200 mg Oral TID WC   sodium chloride flush  3 mL Intravenous Q12H   sodium chloride flush  3 mL Intravenous Q12H   sodium chloride flush  3 mL Intravenous Q12H     Assessment/Plan: **Chest pain:  per cardiology -- thought was perhaps demand ischemia with SVT but given risk factors proceeded to [facility] 11/9 - per his report no PCI, report pending.    **ESRD on PD:  PD tonight if remains admitted.  Labs don't look great but last Kt/V was good in 07/2021 and pt says adherent to treatment.   Use 50/50 1.5 and 2.5% dialysate again tonight.  Heparin added per home regimen.  Daily gentamicin to exit site.    **Anemia:  Hb 9.4 - was 10.2 on 11/2.  No report of frank bleeding.  Trend.  May need ESA.     **BMM:  Corr ca ok.  Phos binder recently increased - changed dose to reflect, follow here.    **HIV: on home meds   **HTN: on home meds, BP normal.    **HFpEF: appears euvolemic.   OK for d/c from nephrology perspective.   Jannifer Hick MD 09/05/2021, 7:51 AM  [facility] Pager: [phone]

## 2021-09-05 NOTE — Progress Notes (Signed)
Mobility Specialist Progress Note    09/05/21 1101  Mobility  Activity Ambulated in hall  Level of Assistance Independent  Assistive Device None  Distance Ambulated (ft) 240 ft  Mobility Ambulated independently in hallway  Mobility Response Tolerated well  Mobility performed by Mobility specialist  $Mobility charge 1 Mobility   During Mobility: 98 HR  Pt received in bed and agreeable. No complaints. Returned to bed with nephew present.   Hildred Alamin Mobility Specialist  Mobility Specialist Phone: [phone]

## 2021-09-05 NOTE — Plan of Care (Signed)
°  Problem: Education: °Goal: Understanding of CV disease, CV risk reduction, and recovery process will improve °Outcome: Adequate for Discharge °Goal: Individualized Educational Video(s) °Outcome: Adequate for Discharge °  °

## 2021-09-05 NOTE — Progress Notes (Addendum)
0043H, patient called in complaining of substernal chest pain, sharp like, 9/10,not radiating anywhere. No diaphoresis, no DOB. Vital signs take and recorded. EKG shows NSR. Post cath, with ongoing peritoneal dialysis.  0048H, chest pain decreased to 2/10. Informed Dr. Humphrey Rolls via page and phone call. Informed HD Nurse Jeanene Erb- continue peritoneal dialysis since patient is stable. Will monitor. Arboriculturist.  09/05/21 0046  Vitals  BP 128/89  MAP (mmHg) 99  BP Location Right Arm  BP Method Automatic  Patient Position (if appropriate) Lying  Pulse Rate 87  Pulse Rate Source Monitor  ECG Heart Rate 89  Resp 16  MEWS COLOR  MEWS Score Color Green  Oxygen Therapy  SpO2 97 %  O2 Device Room Air  MEWS Score  MEWS Temp 0  MEWS Systolic 0  MEWS Pulse 0  MEWS RR 0  MEWS LOC 0  MEWS Score 0   0116H, chest 1-2/10, complaining of dizziness, nausea and headache. Peritoneal Dialysis machine draining fluid. Informed HD Nurse Gleason.   0130H, seen by Jeanene Erb, HD Nurse. Stable vital signs. Symptoms improved. Continue PD per HD nurse. Will monitor. Informed onc all Hospitalist.  [phone], patient vomited (saliva). On peritoneal dialysis (PD) 4/6 (filling process). Patient said chest pain 8/10 occurred when he vomited and the pain subsided after. Seen by HD Nurse. Stopped PD. Monitored vital signs. Informed hospitalist. Order received and carried out.   0245H, asleep. No complaint. Stable vital signs.

## 2021-09-05 NOTE — Progress Notes (Signed)
Progress Note  Patient Name: Albert Eaton Date of Encounter: 09/05/2021  [facility] Cardiologist: [name], MD   Subjective   No chest pain or shortness of breath overnight.  Patient feels well and eager to go home today.  Inpatient Medications    Scheduled Meds:  amLODipine  10 mg Oral Q1200   aspirin EC  81 mg Oral q AM   atorvastatin  40 mg Oral Daily   bictegravir-emtricitabine-tenofovir AF  1 tablet Oral Daily   calcitRIOL  0.5 mcg Oral Q M,W,F   carvedilol  25 mg Oral BID WC   gentamicin cream  1 application Topical Daily   heparin injection (subcutaneous)  5,000 Units Subcutaneous Q8H   hydrALAZINE  100 mg Oral TID   influenza vac split quadrivalent PF  0.5 mL Intramuscular Tomorrow-1000   levothyroxine  25 mcg Oral QAC breakfast   multivitamin  1 tablet Oral QHS   nitroGLYCERIN  1 inch Topical Q6H   sevelamer carbonate  3,200 mg Oral TID WC   sodium chloride flush  3 mL Intravenous Q12H   sodium chloride flush  3 mL Intravenous Q12H   sodium chloride flush  3 mL Intravenous Q12H   Continuous Infusions:  sodium chloride     sodium chloride     dialysis solution 1.5% low-MG/low-CA     dialysis solution 2.5% low-MG/low-CA     PRN Meds: sodium chloride, sodium chloride, acetaminophen **OR** acetaminophen, albuterol, dianeal solution for CAPD/CCPD with heparin, dianeal solution for CAPD/CCPD with heparin, prochlorperazine, sodium chloride flush, sodium chloride flush   Vital Signs    Vitals:   09/05/21 0235 09/05/21 0510 09/05/21 0827 09/05/21 0829  BP: 122/88 108/72 (!) 148/94   Pulse: 91 94  95  Resp:  15    Temp:  98 F (36.7 C)    TempSrc:  Oral    SpO2: 96% 96%    Weight: 94.5 kg 93.6 kg    Height:        Intake/Output Summary (Last 24 hours) at 09/05/2021 0935 Last data filed at 09/05/2021 0240 Gross per 24 hour  Intake 13033 ml  Output 13531 ml  Net -498 ml   Last 3 Weights 09/05/2021 09/05/2021 09/04/2021  Weight (lbs) 206 lb  5.6 oz 208 lb 5.4 oz 209 lb 7 oz  Weight (kg) 93.6 kg 94.5 kg 95 kg      Telemetry    Normal sinus rhythm with no arrhythmia - Personally Reviewed    Physical Exam  Alert, oriented, no distress GEN: No acute distress.   Neck: No JVD Cardiac: RRR, soft systolic murmur at the apex Respiratory: Clear to auscultation bilaterally. GI: Soft, nontender, non-distended  MS: No edema; No deformity.  Right groin site is clear with no hematoma or ecchymosis Neuro:  Nonfocal  Psych: Normal affect   Labs    High Sensitivity Troponin:   Recent Labs  Lab 09/03/21 1732 09/03/21 2032  TROPONINIHS 108* 109*     Chemistry Recent Labs  Lab 09/03/21 1732 09/03/21 1739 09/04/21 0347 09/04/21 1641 09/04/21 1646 09/05/21 0234  NA 138 137 135 136 137 133*  K 3.4* 3.3* 3.4* 4.2 4.0 3.9  CL 96* 102 98  --   --  94*  CO2 22  --  19*  --   --  21*  GLUCOSE 86 86 96  --   --  109*  BUN 67* 60* 73*  --   --  81*  CREATININE 16.14* >18.00* 16.40*  --   --  16.93*  CALCIUM 10.3  --  9.4  --   --  9.2  PROT 7.9  --   --   --   --   --   ALBUMIN 4.2  --   --   --   --  3.7  AST 22  --   --   --   --   --   ALT 15  --   --   --   --   --   ALKPHOS 73  --   --   --   --   --   BILITOT 0.8  --   --   --   --   --   GFRNONAA 3*  --  3*  --   --  3*  ANIONGAP 20*  --  18*  --   --  18*    Lipids  Recent Labs  Lab 09/04/21 0347  CHOL 126  TRIG 320*  HDL 34*  LDLCALC 28  CHOLHDL 3.7    Hematology Recent Labs  Lab 09/03/21 1732 09/03/21 1739 09/04/21 0347 09/04/21 1641 09/04/21 1646 09/05/21 0234  WBC 8.2  --  7.7  --   --  6.4  RBC 3.46*  --  2.63*  --   --  2.93*  HGB 11.1*   < > 8.4* 8.8* 8.5* 9.4*  HCT 31.4*   < > 24.3* 26.0* 25.0* 26.7*  MCV 90.8  --  92.4  --   --  91.1  MCH 32.1  --  31.9  --   --  32.1  MCHC 35.4  --  34.6  --   --  35.2  RDW 15.8*  --  15.9*  --   --  15.9*  PLT 257  --  236  --   --  213   < > = values in this interval not displayed.   Thyroid   Recent Labs  Lab 09/03/21 1813  TSH 1.870    BNPNo results for input(s): BNP, PROBNP in the last 168 hours.  DDimer No results for input(s): DDIMER in the last 168 hours.   Radiology    CARDIAC CATHETERIZATION  Result Date: 09/04/2021   Prox RCA lesion is 50% stenosed.   Mid LAD lesion is 50% stenosed.   Prox LAD lesion is 40% stenosed.   2nd Mrg lesion is 30% stenosed. Mild to moderate non-obstructive CAD Normal right and left heart pressures Continue medical management of CAD.   DG Chest Port 1 View  Result Date: 09/03/2021 CLINICAL DATA:  Chest pain. EXAM: PORTABLE CHEST 1 VIEW COMPARISON:  06/17/2021. FINDINGS: Mild enlargement of the cardiac silhouette. Pulmonary vascular congestion. Low lung volumes with mild bibasilar opacities. No visible pleural effusions or pneumothorax. No acute osseous abnormality. IMPRESSION: 1. Mild cardiomegaly and pulmonary vascular congestion. 2. Low lung volumes with mild bibasilar opacities, likely atelectasis. Electronically Signed   By: Margaretha Sheffield M.D.   On: 09/03/2021 18:36    Cardiac Studies   Cardiac catheterization reviewed as above  Patient Profile     55 y.o. male with chest pain and mildly elevated troponin, end-stage renal disease on peritoneal dialysis, and history of atrial tachycardia  Assessment & Plan    1.  Chest pain, definitive evaluation with cardiac catheterization yesterday demonstrates nonobstructive coronary artery disease.  Patient needs aggressive secondary risk reduction measures with a high intensity statin drug.  He should continue on a beta-blocker.  He remains on aspirin 81 mg.  From a cardiac perspective he is stable for discharge today. 2.  Atrial tachycardia, paroxysmal: Maintaining sinus rhythm.  Continue carvedilol.  Previous EP evaluation reviewed with plans for conservative therapy unless symptoms worsen. 3.  End-stage renal disease on peritoneal dialysis: Management per nephrology.  Patient continues to  undergo renal transplant work-up at [facility] and through [facility]. 4.  Hypertension: Continue amlodipine, carvedilol, and hydralazine.  Transition nitroglycerin ointment to isosorbide.  [facility] will sign off.   Medication Recommendations:  as above Other recommendations (labs, testing, etc):  none Follow up as an outpatient:  will arrange  For questions or updates, please contact [place] Please consult [email or web address] for contact info under        Signed, [name], MD  09/05/2021, 9:35 AM

## 2021-09-05 NOTE — Plan of Care (Signed)

## 2021-09-05 NOTE — Progress Notes (Signed)
Discharge instructions (including medications) discussed with and copy provided to patient/caregiver 

## 2021-09-05 NOTE — Progress Notes (Signed)
Reviewed d/c instructions with patient, no questions or concerns. Patient was safely d/c and wheeled down by CNA.

## 2021-09-05 NOTE — TOC Initial Note (Signed)
Transition of Care [facility]) - Initial/Assessment Note    Patient Details  Name: Albert Eaton MRN: [id number] Date of Birth: 1966/03/08  [facility] CM/SW Contact:    Bethena Roys, RN Phone Number: 09/05/2021, 11:24 AM  Clinical Narrative: Risk for readmission assessment completed. Prior to arrival patient was independent from home and plans to return home. Patient states he has no issues with transportation and getting to appointments. Patient states he uses [employer] representative and the only issue he has is getting medications delivered to him timely. Case Manager spoke with [facility] Liaison regarding issue with [place] and the pharmacists with [facility] will follow up with the patient. Patient has transportation home. No further needs from Case Manager at this time.                   Expected Discharge Plan: Home/Self Care Barriers to Discharge: No Barriers Identified   Patient Goals and CMS Choice Patient states their goals for this hospitalization and ongoing recovery are:: to return home   Choice offered to / list presented to : NA  Expected Discharge Plan and Services Expected Discharge Plan: Home/Self Care In-house Referral: [facility] Discharge Planning Services: CM Consult Post Acute Care Choice: NA Living arrangements for the past 2 months: Apartment Expected Discharge Date: 09/05/21               DME Arranged: N/A         HH Arranged: NA          Prior Living Arrangements/Services Living arrangements for the past 2 months: Apartment Lives with:: Self Patient language and need for interpreter reviewed:: Yes Do you feel safe going back to the place where you live?: Yes      Need for Family Participation in Patient Care: No (Comment) Care giver support system in place?: No (comment)   Criminal Activity/Legal Involvement Pertinent to Current Situation/Hospitalization: No - Comment as needed  Activities of Daily Living Home Assistive  Devices/Equipment: None ADL Screening (condition at time of admission) Patient's cognitive ability adequate to safely complete daily activities?: Yes Is the patient deaf or have difficulty hearing?: No Does the patient have difficulty seeing, even when wearing glasses/contacts?: No Does the patient have difficulty concentrating, remembering, or making decisions?: No Patient able to express need for assistance with ADLs?: No Does the patient have difficulty dressing or bathing?: No Independently performs ADLs?: Yes (appropriate for developmental age) Does the patient have difficulty walking or climbing stairs?: No Weakness of Legs: None Weakness of Arms/Hands: None  Permission Sought/Granted Permission sought to share information with : Family Supports, Customer service manager                Emotional Assessment Appearance:: Appears stated age Attitude/Demeanor/Rapport: Engaged Affect (typically observed): Appropriate Orientation: : Oriented to  Time, Oriented to Place, Oriented to Self, Oriented to Situation Alcohol / Substance Use: Not Applicable Psych Involvement: No (comment)  Admission diagnosis:  NSTEMI (non-ST elevated myocardial infarction) ([place]) [I21.4] Chest pain, unspecified type [R07.9] Patient Active Problem List   Diagnosis Date Noted   Chest pain of uncertain etiology    Elevated troponin    NSTEMI (non-ST elevated myocardial infarction) ([place]) 09/03/2021   Essential hypertension 09/03/2021   Renal cell carcinoma ([place]) 06/27/2021   SVT (supraventricular tachycardia) ([place]) 01/18/2021   Lower abdominal pain 06/26/2020   Pulsatile tinnitus of right ear 11/21/2019   Kidney failure 10/19/8249   Acute diastolic CHF (congestive heart failure) ([place]) 11/21/2019  Chills (without fever) 11/07/2019   Hypercalcemia 10/17/2019   Dependence on renal dialysis ([place]) 06/06/2019   Coagulation defect, unspecified ([place]) 05/23/2019   Iron deficiency anemia, unspecified  05/16/2019   Anemia in other chronic diseases classified elsewhere 05/13/2019   Arteriovenous fistula, acquired ([place]) 05/13/2019   Body mass index (BMI) 28.0-28.9, adult 05/13/2019   Crohn's disease of small intestine without complications ([place]) 87/56/4332   Encounter for screening for respiratory tuberculosis 05/13/2019   Gout, unspecified 05/13/2019   Nausea 05/13/2019   Other fatigue 05/13/2019   Secondary hyperparathyroidism of renal origin ([place]) 05/13/2019   End stage renal disease ([place]) 05/13/2019   Chronic diastolic (congestive) heart failure ([place]) 05/13/2019   Chronic kidney disease, unspecified 05/13/2019   Obstructive sleep apnea 05/13/2019   Discomfort of right ear 02/18/2019   Hypokalemia 06/13/2018   Abnormal CT scan, small bowel    Perennial allergic rhinitis with a predominant nonallergic component 02/09/2018   Allergic conjunctivitis 02/09/2018   Mild intermittent asthma 02/09/2018   Atherosclerosis 12/09/2017   Hypothyroidism 11/19/2017   Mixed hyperlipidemia 10/13/2017   Personal history of gout 10/13/2017   S/P cholecystectomy 10/13/2017   Congestive heart failure with LV diastolic dysfunction, NYHA class 1 ([place]) 10/13/2017   Hypothyroidism (acquired) 10/13/2017   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   SOB (shortness of breath) 06/07/2017   Arthritis, gouty 01/27/2017   Chronic diastolic CHF (congestive heart failure) ([place]) 01/06/2017   OSA (obstructive sleep apnea) 01/06/2017   Abnormal electrocardiogram (ECG) (EKG) 10/31/2016   Chest pain 10/09/2016   HIV disease ([place]) 10/09/2016   Hypertensive heart disease with CHF (congestive heart failure) ([place]) 10/09/2016   Left-sided low back pain without sciatica 05/29/2015   NYHA class 2 heart failure with preserved ejection fraction ([place]) 05/15/2015   PAH (pulmonary artery hypertension) ([place]) 05/08/2015   Severe mitral regurgitation 05/04/2015   Prolonged Q-T interval on ECG 05/04/2015   PCP:  [name], FNP Pharmacy:   [facility] [place], [address] AT [address] [place] [address] Phone: [phone] Fax: [phone]   Readmission Risk Interventions Readmission Risk Prevention Plan 09/05/2021 09/04/2020  Transportation Screening Complete Complete  Medication Review Press photographer) Complete Complete  PCP or Specialist appointment within 3-5 days of discharge Complete Complete  HRI or Home Care Consult Complete Complete  SW Recovery Care/Counseling Consult Complete Complete  Palliative Care Screening Not Applicable Not [place] Not Applicable Not Applicable  Some recent data might be hidden

## 2021-09-06 ENCOUNTER — Telehealth: Payer: Self-pay

## 2021-09-06 ENCOUNTER — Encounter: Payer: Self-pay | Admitting: Dermatology

## 2021-09-06 DIAGNOSIS — Z992 Dependence on renal dialysis: Secondary | ICD-10-CM | POA: Diagnosis not present

## 2021-09-06 DIAGNOSIS — N186 End stage renal disease: Secondary | ICD-10-CM | POA: Diagnosis not present

## 2021-09-06 DIAGNOSIS — D631 Anemia in chronic kidney disease: Secondary | ICD-10-CM | POA: Diagnosis not present

## 2021-09-06 DIAGNOSIS — R82998 Other abnormal findings in urine: Secondary | ICD-10-CM | POA: Diagnosis not present

## 2021-09-06 DIAGNOSIS — Z79899 Other long term (current) drug therapy: Secondary | ICD-10-CM | POA: Diagnosis not present

## 2021-09-06 DIAGNOSIS — N2581 Secondary hyperparathyroidism of renal origin: Secondary | ICD-10-CM | POA: Diagnosis not present

## 2021-09-06 DIAGNOSIS — D509 Iron deficiency anemia, unspecified: Secondary | ICD-10-CM | POA: Diagnosis not present

## 2021-09-06 NOTE — Progress Notes (Signed)
   Follow-Up Visit   Subjective  Albert Eaton is a 55 y.o. male who presents for the following: Follow-up (Follow up rash on the chest he never picked up the protopic due to cost and he has a stye on the eye ).  Rash on chest, bumps on side of eyes. Location:  Duration:  Quality:  Associated Signs/Symptoms: Modifying Factors:  Severity:  Timing: Context:   Objective  Well appearing patient in no apparent distress; mood and affect are within normal limits. Right Abdomen (side) - Upper History of tape/adhesive intolerance.  Chest (Upper Torso, Anterior) Mixed micropapular dermatitis/folliculitis.  Uncertain relationship to his chronic kidney disease.  We will try clobetasol foam daily after bathing for just 3 to 4 weeks.  Avoid use on face.  Medical staff will try to help him get this at an affordable price.  Left Lateral [place]hus, Right Lateral [place]hus 85m vesicular papules lateral Thigh typical of hydrocystoma's.    All skin waist up examined.   Assessment & Plan    Contact dermatitis, unspecified contact dermatitis type, unspecified trigger Right Abdomen (side) - Upper  Patient given a sample of Hypafix to do a test spot  Folliculitis Chest (Upper Torso, Anterior)  Patient will get OTC pramoxine.   clobetasol (OLUX) 0.05 % topical foam - Chest (Upper Torso, Anterior) Apply topically 2 (two) times daily.  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  Patient told that recurrence after conservative excision was quite common.      I, SLavonna Monarch MD, have reviewed all documentation for this visit.  The documentation on 09/06/21 for the exam, diagnosis, procedures, and orders are all accurate and complete.

## 2021-09-06 NOTE — Progress Notes (Signed)
[facility])  [place] Team    09/06/2021  Dallyn Bergland Hornaday 1966-02-10 [id number]  Reason for referral:  Assistance with finding pharmacy  Referral source:  [place]  Current insurance: [facility]  PMHx includes but not limited to:  Patient was recently discharged from the hospital for a NSTEMI and recent medication changes include starting Imdur and stopping Percocet. [place] received a referral to help patient find a Pharmacy that deliveries in a reasonable time.   Outreach:  Successful telephone call with patient.  HIPAA identifiers verified.   Subjective:  He currently receive medication through [facility] delivery services and he reported it takes 5-7 days to get medicine. He has not tried local pharmacy's that deliver or mail-order pharmacy through [facility] ("[facility]/Mail-order').   I called [employer] and was provided an extensive list of pharmacy's that deliver in [place]. I talked to the patient and informed him of a select list of pharmacy's that deliver [facility], [facility], and [place]). The aforementioned pharmacy's deliver in [place] at no charge, they offer same-day or next-day delivery as long as patient orders medication by noon, and deliver M-F starting at 2 pm. I asked patient to review the pharmacy's and I will follow up on Monday to help facilitate transfer to new pharmacy.     Patient also stated that he has not picked up new prescriptions from pharmacy because he wasn't feeling well and assured me he would pick it up tomorrow. He denied any medical issues that were urgent or life threatening.   Objective: The ASCVD Risk score (Arnett DK, et al., 2019) failed to calculate for the following reasons:   The patient has a prior MI or stroke diagnosis  Lab Results  Component Value Date   CREATININE 16.93 (H) 09/05/2021   CREATININE 16.40 (H) 09/04/2021   CREATININE >18.00 (H) 09/03/2021     Lab Results  Component Value Date   HGBA1C 5.4 08/08/2021    Lipid Panel     Component Value Date/Time   CHOL 126 09/04/2021 0347   CHOL 126 05/01/2021 1055   TRIG 320 (H) 09/04/2021 0347   HDL 34 (L) 09/04/2021 0347   HDL 40 05/01/2021 1055   CHOLHDL 3.7 09/04/2021 0347   VLDL 64 (H) 09/04/2021 0347   LDLCALC 28 09/04/2021 0347   LDLCALC 83 06/27/2021 0424    BP Readings from Last 3 Encounters:  09/05/21 (!) 148/94  08/12/21 126/80  08/08/21 136/84    Allergies  Allergen Reactions   Ace Inhibitors Cough and Other (See Comments)   Lisinopril Cough    Medications Reviewed Today     Reviewed by Tollie Pizza, CPhT (Pharmacy Technician) on 09/05/21 at 1145  Med List Status: Follow Up Visit   Medication Order Taking? Sig Documenting Provider Last Dose Status Informant  ACETAMINOPHEN EXTRA STRENGTH 500 MG tablet [id number]  Take 500 mg by mouth every 6 (six) hours as needed (pain.). [provider]  Active Self  albuterol [facility] HFA) 108 (90 Base) MCG/ACT inhaler [id number]  INHALE 2 PUFFS INTO THE LUNGS EVERY 4 HOURS AS NEEDED FOR WHEEZING OR SHORTNESS OF Nathen May, MD  Active   allopurinol (ZYLOPRIM) 100 MG tablet [id number]  TAKE 1 TABLET(100 MG) BY MOUTH TWICE DAILY FOR [address] DOSES [name], FNP  Active   amLODipine (NORVASC) 10 MG tablet [id number]  Take 1 tablet (10 mg total) by mouth daily.  Patient taking differently: Take  10 mg by mouth daily at 12 noon. At midday   [place], FNP  Active   aspirin EC 81 MG tablet [id number]  Take 1 tablet (81 mg total) by mouth daily. Swallow whole.  Patient taking differently: Take 81 mg by mouth in the morning. Swallow whole.   Richardson Dopp T, PA-C  Active   azelastine (ASTELIN) 0.1 % nasal spray [id number]  PLACE 2 SPRAYS INTO EACH NOSTRIL TWICE DAILY AS NEEDED FOR RHINITIS AS DIRECTED  Patient taking differently: Place 2 sprays into both nostrils 2 (two) times daily as needed for  allergies or rhinitis.   Valentina Shaggy, MD  Active Self  BIKTARVY 50-200-25 MG TABS tablet [id number]  TAKE 1 TABLET BY MOUTH DAILY Campbell Riches, MD  Active   calcitRIOL (ROCALTROL) 0.5 MCG capsule [id number]  Take 0.5 mcg by mouth every Monday, Wednesday, and Friday. [provider]  Active Self           Med Note Luana Shu, NATASHA   Wed Mar 06, 2021  8:08 AM)    carvedilol (COREG) 25 MG tablet [id number]  TAKE 1 TABLET BY MOUTH TWICE DAILY WITH FOOD  Patient taking differently: Take 25 mg by mouth 2 (two) times daily with a meal.   [name], MD  Active   chlorproMAZINE (THORAZINE) 10 MG tablet [id number]  Take 1 tablet (10 mg total) by mouth 3 (three) times daily as needed for hiccoughs.  Patient taking differently: Take 10 mg by mouth 3 (three) times daily.   [name], FNP  Active   clobetasol (OLUX) 0.05 % topical foam [id number]  Apply topically 2 (two) times daily. Lavonna Monarch, MD  Active   clonazePAM [facility] [place]) 0.5 MG tablet [id number]  Take 1 tablet (0.5 mg total) by mouth at bedtime. Laurin Coder, MD  Active Self  clotrimazole-betamethasone (LOTRISONE) cream [id number]  Apply topically 2 (two) times daily. X 3-5 days. IF symptoms continue, call office  Patient taking differently: Apply 1 application topically 2 (two) times daily as needed (rectal discomfort).   Jerene Bears, MD  Active            Med Note [place] Crane, MISTY D   Mon Jun 17, 2021  2:46 AM)    docusate sodium (COLACE) 100 MG capsule [id number]  Take 100 mg by mouth 2 (two) times daily. [provider]  Active Self  fluticasone (FLOVENT HFA) 110 MCG/ACT inhaler [id number]  INHALE 2 PUFFS INTO THE LUNGS TWICE DAILY Valentina Shaggy, MD  Active   gabapentin (NEURONTIN) 300 MG capsule [id number]  TAKE 1 CAPSULE(300 MG) BY MOUTH THREE TIMES DAILY  Patient taking differently: Take 300 mg by mouth 3 (three) times daily.   [name], FNP  Active   gentamicin cream (GARAMYCIN)  0.1 % [id number]  Apply 1 application topically See admin instructions. Apply when catheter is flushed and cleaned [provider]  Active Self  hydrALAZINE (APRESOLINE) 100 MG tablet [id number]  Take 1 tablet (100 mg total) by mouth 3 (three) times daily. [name], MD  Active Self  hyoscyamine (LEVSIN SL) 0.125 MG SL tablet [id number]  Place 1-2 tablets (0.125-0.25 mg total) under the tongue every 8 (eight) hours as needed (lower abdominal pain, spasm). Jerene Bears, MD  Active Self  ipratropium (ATROVENT) 0.06 % nasal spray [id number]  USE 2 SPRAYS IN EACH NOSTRIL 2 TO 3 TIMES DAILY AS NEEDED  Patient taking differently: Place 2 sprays into both nostrils 3 (three)  times daily as needed (allergies).   Valentina Shaggy, MD  Active   iron sucrose (VENOFER) 20 MG/ML injection [id number]  Inject into the vein. [provider]  Active Self           Med Note Kenton Kingfisher, Germaine Pomfret May 29, 2021  8:59 AM) At dialysis  lactulose [facility]) 10 GM/15ML solution [id number]  Take 20 g by mouth daily as needed for moderate constipation.  [provider]  Active Self  levothyroxine (SYNTHROID) 25 MCG tablet [id number]  TAKE 1 TABLET BY MOUTH DAILY BEFORE BREAKFAST  Patient taking differently: Take 25 mcg by mouth daily before breakfast.   [name], FNP  Active   lidocaine-prilocaine (EMLA) cream [id number]  Apply 1 application topically See admin instructions. Apply to access site 1-2 hours before dialysis [provider]  Active Self  lubiprostone (AMITIZA) 24 MCG capsule [id number]  TAKE 1 CAPSULE(24 MCG) BY MOUTH TWICE DAILY WITH A MEAL  Patient taking differently: Take 24 mcg by mouth in the morning and at bedtime.   Jerene Bears, MD  Active   methocarbamol (ROBAXIN) 500 MG tablet [id number]  Take 1 tablet (500 mg total) by mouth every 8 (eight) hours as needed for muscle spasms.  Patient taking differently: Take 500 mg by mouth at bedtime.   Maudie Flakes, MD  Active   Methoxy PEG-Epoetin Beta [facility]) [id number]  Inject into the skin. [provider]  Active Self           Med Note Kenton Kingfisher, Germaine Pomfret May 29, 2021  9:00 AM) At dialysis  multivitamin (RENA-VIT) TABS tablet [id number]  Take 1 tablet by mouth at bedtime. [provider]  Active Self  mupirocin ointment (BACTROBAN) 2 % [id number]  Apply 1 application topically daily as needed (apply to port after dialysis).  [provider]  Active Self  Olopatadine HCl (PAZEO) 0.7 % SOLN [id number]  Place 1 drop into both eyes daily.  Patient taking differently: Place 1 drop into both eyes daily as needed (itchy eyes).   Valentina Shaggy, MD  Active   oxyCODONE-acetaminophen [facility] 10-325 MG tablet [id number]  Take 1 tablet by mouth every 6 (six) hours as needed for pain. Setzer, Edman Circle, PA-C  Active Self  pantoprazole (PROTONIX) 40 MG tablet [id number]  TAKE 1 TABLET(40 MG) BY MOUTH DAILY [place], Amy S, PA-C  Active   predniSONE (DELTASONE) 10 MG tablet [id number]  Take 1 tablet (10 mg total) by mouth daily with breakfast. Suzan Slick, NP  Active   rOPINIRole (REQUIP) 0.5 MG tablet [id number]  Take 0.5 mg by mouth at bedtime. [provider]  Active Self  rOPINIRole (REQUIP) 1 MG tablet [id number]  Take 1 tablet (1 mg total) by mouth at bedtime. Laurin Coder, MD  Active   rosuvastatin (CRESTOR) 10 MG tablet [id number]  Take 1 tablet (10 mg total) by mouth daily.  Patient taking differently: Take 10 mg by mouth in the morning.   Richardson Dopp T, PA-C  Expired 06/17/21 2359 Self  senna (SENOKOT) 8.6 MG TABS tablet [id number]  Take 2 tablets by mouth every evening. [provider]  Active Self  sevelamer carbonate (RENVELA) 800 MG tablet [id number]  Take 1,600 mg by mouth 3 (three) times daily with meals. [provider]  Active Self  Spacer/Aero-Holding Dorise Bullion [id number]  1 Device by Does not apply route 2 (two) times a  day. Golda Acre  Eulas Post, MD  Active Self  Vitamin D, Ergocalciferol, (DRISDOL) 1.25 MG (50000 UNIT) CAPS capsule [id number]  TAKE 1 CAPSULE BY MOUTH EVERY 7 DAYS  Patient taking differently: Take 50,000 Units by mouth every Tuesday. At midday   [name], FNP  Active   Med List Note Felton Clinton, [facility] 08/30/20 1800): Dialysis Monday, Tuesday, Thursday, and Friday            Assessment/Plan: I will follow up with patient on Monday regarding choice of pharmacy and to ensure he picked up the medication.   Thank you for allowing pharmacy to be a part of this patient's care.  Kristeen Miss, PharmD Clinical Pharmacist [place] Cell: [phone]

## 2021-09-07 DIAGNOSIS — Z992 Dependence on renal dialysis: Secondary | ICD-10-CM | POA: Diagnosis not present

## 2021-09-07 DIAGNOSIS — N2581 Secondary hyperparathyroidism of renal origin: Secondary | ICD-10-CM | POA: Diagnosis not present

## 2021-09-07 DIAGNOSIS — N186 End stage renal disease: Secondary | ICD-10-CM | POA: Diagnosis not present

## 2021-09-07 DIAGNOSIS — Z79899 Other long term (current) drug therapy: Secondary | ICD-10-CM | POA: Diagnosis not present

## 2021-09-07 DIAGNOSIS — R82998 Other abnormal findings in urine: Secondary | ICD-10-CM | POA: Diagnosis not present

## 2021-09-07 DIAGNOSIS — D509 Iron deficiency anemia, unspecified: Secondary | ICD-10-CM | POA: Diagnosis not present

## 2021-09-07 DIAGNOSIS — D631 Anemia in chronic kidney disease: Secondary | ICD-10-CM | POA: Diagnosis not present

## 2021-09-08 DIAGNOSIS — R82998 Other abnormal findings in urine: Secondary | ICD-10-CM | POA: Diagnosis not present

## 2021-09-08 DIAGNOSIS — D509 Iron deficiency anemia, unspecified: Secondary | ICD-10-CM | POA: Diagnosis not present

## 2021-09-08 DIAGNOSIS — Z79899 Other long term (current) drug therapy: Secondary | ICD-10-CM | POA: Diagnosis not present

## 2021-09-08 DIAGNOSIS — D631 Anemia in chronic kidney disease: Secondary | ICD-10-CM | POA: Diagnosis not present

## 2021-09-08 DIAGNOSIS — N2581 Secondary hyperparathyroidism of renal origin: Secondary | ICD-10-CM | POA: Diagnosis not present

## 2021-09-08 DIAGNOSIS — N186 End stage renal disease: Secondary | ICD-10-CM | POA: Diagnosis not present

## 2021-09-08 DIAGNOSIS — Z992 Dependence on renal dialysis: Secondary | ICD-10-CM | POA: Diagnosis not present

## 2021-09-09 DIAGNOSIS — N186 End stage renal disease: Secondary | ICD-10-CM | POA: Diagnosis not present

## 2021-09-09 DIAGNOSIS — Z992 Dependence on renal dialysis: Secondary | ICD-10-CM | POA: Diagnosis not present

## 2021-09-09 DIAGNOSIS — R82998 Other abnormal findings in urine: Secondary | ICD-10-CM | POA: Diagnosis not present

## 2021-09-09 DIAGNOSIS — Z79899 Other long term (current) drug therapy: Secondary | ICD-10-CM | POA: Diagnosis not present

## 2021-09-09 DIAGNOSIS — D509 Iron deficiency anemia, unspecified: Secondary | ICD-10-CM | POA: Diagnosis not present

## 2021-09-09 DIAGNOSIS — N2581 Secondary hyperparathyroidism of renal origin: Secondary | ICD-10-CM | POA: Diagnosis not present

## 2021-09-09 DIAGNOSIS — D631 Anemia in chronic kidney disease: Secondary | ICD-10-CM | POA: Diagnosis not present

## 2021-09-10 ENCOUNTER — Ambulatory Visit (INDEPENDENT_AMBULATORY_CARE_PROVIDER_SITE_OTHER): Payer: Medicare Other | Admitting: Allergy & Immunology

## 2021-09-10 ENCOUNTER — Encounter: Payer: Self-pay | Admitting: Allergy & Immunology

## 2021-09-10 ENCOUNTER — Other Ambulatory Visit: Payer: Self-pay

## 2021-09-10 VITALS — HR 85 | Temp 98.2°F | Resp 16 | Ht 70.0 in | Wt 212.2 lb

## 2021-09-10 DIAGNOSIS — J4530 Mild persistent asthma, uncomplicated: Secondary | ICD-10-CM | POA: Diagnosis not present

## 2021-09-10 DIAGNOSIS — N186 End stage renal disease: Secondary | ICD-10-CM | POA: Diagnosis not present

## 2021-09-10 DIAGNOSIS — R82998 Other abnormal findings in urine: Secondary | ICD-10-CM | POA: Diagnosis not present

## 2021-09-10 DIAGNOSIS — M9904 Segmental and somatic dysfunction of sacral region: Secondary | ICD-10-CM | POA: Diagnosis not present

## 2021-09-10 DIAGNOSIS — M9903 Segmental and somatic dysfunction of lumbar region: Secondary | ICD-10-CM | POA: Diagnosis not present

## 2021-09-10 DIAGNOSIS — M9905 Segmental and somatic dysfunction of pelvic region: Secondary | ICD-10-CM | POA: Diagnosis not present

## 2021-09-10 DIAGNOSIS — R432 Parageusia: Secondary | ICD-10-CM

## 2021-09-10 DIAGNOSIS — M5386 Other specified dorsopathies, lumbar region: Secondary | ICD-10-CM | POA: Diagnosis not present

## 2021-09-10 DIAGNOSIS — D509 Iron deficiency anemia, unspecified: Secondary | ICD-10-CM | POA: Diagnosis not present

## 2021-09-10 DIAGNOSIS — Z79899 Other long term (current) drug therapy: Secondary | ICD-10-CM | POA: Diagnosis not present

## 2021-09-10 DIAGNOSIS — J3089 Other allergic rhinitis: Secondary | ICD-10-CM | POA: Diagnosis not present

## 2021-09-10 DIAGNOSIS — D631 Anemia in chronic kidney disease: Secondary | ICD-10-CM | POA: Diagnosis not present

## 2021-09-10 DIAGNOSIS — Z992 Dependence on renal dialysis: Secondary | ICD-10-CM | POA: Diagnosis not present

## 2021-09-10 DIAGNOSIS — N2581 Secondary hyperparathyroidism of renal origin: Secondary | ICD-10-CM | POA: Diagnosis not present

## 2021-09-10 NOTE — Patient Instructions (Addendum)
1. Mild persistent asthma, uncomplicated - Lung testing not done since we did one one week ago, but you seem to be doing better with regards to your albuterol use which is great.  - Daily controller medication(s): Flovent two puffs once daily with spacer - Prior to physical activity: albuterol 2 puffs 10-15 minutes before physical activity. - Rescue medications: albuterol 4 puffs every 4-6 hours as needed - Changes during respiratory infections or worsening symptoms: Increase Flovent 161mg to 2 puffs twice daily for TWO WEEKS. - Asthma control goals:  * Full participation in all desired activities (may need albuterol before activity) * Albuterol use two time or less a week on average (not counting use with activity) * Cough interfering with sleep two time or less a month * Oral steroids no more than once a year * No hospitalizations  2. Perennial allergic conjunctivitis of both eyes - Continue with Pataday one drop per eye twice daily. - Continue with Systane eye drops as needed. - Continue with the nose sprays as needed.   3. Odd taste in mouth - I cannot wait to see what the ENT thinks.   4. Return in about 6 months (around 03/10/2022).    Please inform [place]of any Emergency Department visits, hospitalizations, or changes in symptoms. Call u[place]abefore going to the ED for breathing or allergy symptoms since we might be able to fit you in for a sick visit. Feel free to contact u[place]anytime with any questions, problems, or concerns.  It was a pleasure to see you again today!  Websites that have reliable patient information: 1. American Academy of Asthma, Allergy, and Immunology: www.aaaai.org 2. Food Allergy Research and Education (FARE): foodallergy.org 3. Mothers of Asthmatics: http://www.asthmacommunitynetwork.org 4. American College of Allergy, Asthma, and Immunology: www.acaai.org   COVID-19 Vaccine Information can be found at:  [email or web address] questions related to vaccine distribution or appointments, please email [email or web address] or call [phone]     "Like" u[place]on Facebook and Instagram for our latest updates!       Make sure you are registered to vote! If you have moved or changed any of your contact information, you will need to get this updated before voting!  In some cases, you MAY be able to register to vote online: [email or web address]

## 2021-09-10 NOTE — Progress Notes (Signed)
FOLLOW UP  Date of Service/Encounter:  09/10/21   Assessment:   Mild persistent asthma, uncomplicated - with better control with daily ICS use (we do have room to increase if needed in the future)   Perennial allergic rhinitis (dust mites)   HIV - on HAART   ESRD - on transplant list    Dysgeusia - unknown cause  Plan/Recommendations:   1. Mild persistent asthma, uncomplicated - Lung testing not done since we did one one month ago, but you seem to be doing better with regards to your albuterol use which is great.  - Daily controller medication(s): Flovent two puffs once daily with spacer - Prior to physical activity: albuterol 2 puffs 10-15 minutes before physical activity. - Rescue medications: albuterol 4 puffs every 4-6 hours as needed - Changes during respiratory infections or worsening symptoms: Increase Flovent 151mg to 2 puffs twice daily for TWO WEEKS. - Asthma control goals:  * Full participation in all desired activities (may need albuterol before activity) * Albuterol use two time or less a week on average (not counting use with activity) * Cough interfering with sleep two time or less a month * Oral steroids no more than once a year * No hospitalizations  2. Perennial allergic conjunctivitis of both eyes - Continue with Pataday one drop per eye twice daily. - Continue with Systane eye drops as needed. - Continue with the nose sprays as needed.   3. Odd taste in mouth - I cannot wait to see what the ENT thinks.   4. Return in about 6 months (around 03/10/2022).     Subjective:   Albert Eaton is a 55y.o. male presenting today for follow up of  Chief Complaint  Patient presents with   Follow-up    Patient in today for a follow up and states he is s=doing great.    Albert Eaton has a history of the following: Patient Active Problem List   Diagnosis Date Noted   Chest pain of uncertain etiology    Elevated troponin    NSTEMI (non-ST elevated  myocardial infarction) ([place] 09/03/2021   Essential hypertension 09/03/2021   Renal cell carcinoma ([place] 06/27/2021   SVT (supraventricular tachycardia) ([facility] 01/18/2021   Lower abdominal pain 06/26/2020   Pulsatile tinnitus of right ear 11/21/2019   Kidney failure 031/59/4585  Acute diastolic CHF (congestive heart failure) ([facility] 11/21/2019   Chills (without fever) 11/07/2019   Hypercalcemia 10/17/2019   Dependence on renal dialysis ([place] 06/06/2019   Coagulation defect, unspecified ([place] 05/23/2019   Iron deficiency anemia, unspecified 05/16/2019   Anemia in other chronic diseases classified elsewhere 05/13/2019   Arteriovenous fistula, acquired ([place] 05/13/2019   Body mass index (BMI) 28.0-28.9, adult 05/13/2019   Crohn's disease of small intestine without complications (HH[place] 092/92/4462  Encounter for screening for respiratory tuberculosis 05/13/2019   Gout, unspecified 05/13/2019   Nausea 05/13/2019   Other fatigue 05/13/2019   Secondary hyperparathyroidism of renal origin ([place] 05/13/2019   End stage renal disease (HF[place] 05/13/2019   Chronic diastolic (congestive) heart failure ([place] 05/13/2019   Chronic kidney disease, unspecified 05/13/2019   Obstructive sleep apnea 05/13/2019   Discomfort of right ear 02/18/2019   Hypokalemia 06/13/2018   Abnormal CT scan, small bowel    Perennial allergic rhinitis with a predominant nonallergic component 02/09/2018   Allergic conjunctivitis 02/09/2018   Mild intermittent asthma 02/09/2018   Atherosclerosis 12/09/2017   Hypothyroidism 11/19/2017   Mixed hyperlipidemia 10/13/2017   Personal  history of gout 10/13/2017   S/P cholecystectomy 10/13/2017   Congestive heart failure with LV diastolic dysfunction, NYHA class 1 ([place]) 10/13/2017   Hypothyroidism (acquired) 10/13/2017   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   SOB (shortness of breath) 06/07/2017   Arthritis, gouty 01/27/2017   Chronic diastolic CHF (congestive heart failure)  ([place]) 01/06/2017   OSA (obstructive sleep apnea) 01/06/2017   Abnormal electrocardiogram (ECG) (EKG) 10/31/2016   Chest pain 10/09/2016   HIV disease ([place]) 10/09/2016   Hypertensive heart disease with CHF (congestive heart failure) ([place]) 10/09/2016   Left-sided low back pain without sciatica 05/29/2015   NYHA class 2 heart failure with preserved ejection fraction ([place]) 05/15/2015   PAH (pulmonary artery hypertension) ([place]) 05/08/2015   Severe mitral regurgitation 05/04/2015   Prolonged Q-T interval on ECG 05/04/2015    History obtained from: chart review and patient.  Albert Eaton is a 55 y.o. male presenting for a follow up visit.  He was last seen around 1 month ago.  At that time, his lung testing looked great, he was using his albuterol more frequently.  We decided to add Flovent 2 puffs once daily to see if this would help.  For his allergic rhinitis, would continue with Pataday as well as over-the-counter eyedrops and nose sprays.  He did endorse not spray in his mouth.  We got a hemoglobin Ac to look for evidence of diabetes; this was normal.  Otherwise, we were not sure why he was experiencing this o1dd taste in his mouth.  Since last visit, he has done well.   Asthma/Respiratory Symptom History: He is using Flovent two puffs in the morning. This seems to cover him through the day. He does not remember the last time that he used his albuterol.  Overall, symptoms have been well controlled.  He has not been to the emergency room for his breathing nor has he needed steroids for his breathing.  Allergic Rhinitis Symptom History: For his allergic rhinitis, he continues with his eyedrops as needed as well as no sprays. He does not use anything on a routine basis.  He was admitted last week for chest pain. Everything was normal. He did have a heart catheter and this was normal. They are unsure of the trigger. Pain resolved on its own. He is unsure what caused the pain. But he did miss his ENT  appointment which was supposed to be on Thursday. He was supposed to see someone. This was to address the weird taste in the mouth.  He continues to have the weird taste in his mouth.  He is hoping to get this fixed before he goes to Thanksgiving.  They are doing an 9 person Thanksgiving in [place], [place].  Otherwise, there have been no changes to his past medical history, surgical history, family history, or social history.    Review of Systems  Constitutional: Negative.  Negative for fever, malaise/fatigue and weight loss.  HENT: Negative.  Negative for congestion, ear discharge and ear pain.   Eyes:  Negative for pain, discharge and redness.  Respiratory:  Negative for cough, sputum production, shortness of breath and wheezing.   Cardiovascular: Negative.  Negative for chest pain and palpitations.  Gastrointestinal:  Negative for abdominal pain, diarrhea, heartburn, nausea and vomiting.  Skin: Negative.  Negative for itching and rash.  Neurological:  Negative for dizziness and headaches.  Endo/Heme/Allergies:  Negative for environmental allergies. Does not bruise/bleed easily.      Objective:   Pulse 85, temperature  98.2 F (36.8 C), temperature source Temporal, resp. rate 16, height 5' 10"  (1.778 m), weight 212 lb 3.2 oz (96.3 kg), SpO2 96 %. Body mass index is 30.45 kg/m.   Physical Exam:  Physical Exam Vitals reviewed.  Constitutional:      Appearance: He is well-developed.     Comments: Very pleasant.  HENT:     Head: Normocephalic and atraumatic.     Right Ear: Tympanic membrane, ear canal and external ear normal.     Left Ear: Tympanic membrane, ear canal and external ear normal.     Nose: No nasal deformity, septal deviation, mucosal edema or rhinorrhea.     Right Turbinates: Not enlarged or swollen.     Left Turbinates: Not enlarged or swollen.     Right Sinus: No maxillary sinus tenderness or frontal sinus tenderness.     Left Sinus: No maxillary  sinus tenderness or frontal sinus tenderness.     Mouth/Throat:     Mouth: Mucous membranes are not pale and not dry.     Pharynx: Uvula midline.  Eyes:     General: Lids are normal. Allergic shiner present.        Right eye: No discharge.        Left eye: No discharge.     Conjunctiva/sclera: Conjunctivae normal.     Right eye: Right conjunctiva is not injected. No chemosis.    Left eye: Left conjunctiva is not injected. No chemosis.    Pupils: Pupils are equal, round, and reactive to light.  Cardiovascular:     Rate and Rhythm: Normal rate and regular rhythm.     Heart sounds: Normal heart sounds.  Pulmonary:     Effort: Pulmonary effort is normal. No tachypnea, accessory muscle usage or respiratory distress.     Breath sounds: Normal breath sounds. No wheezing, rhonchi or rales.  Chest:     Chest wall: No tenderness.  Lymphadenopathy:     Cervical: No cervical adenopathy.  Skin:    Coloration: Skin is not pale.     Findings: No abrasion, erythema, petechiae or rash. Rash is not papular, urticarial or vesicular.  Neurological:     Mental Status: He is alert.  Psychiatric:        Behavior: Behavior is cooperative.     Diagnostic studies: none       Salvatore Marvel, MD  [facility]

## 2021-09-11 DIAGNOSIS — Z992 Dependence on renal dialysis: Secondary | ICD-10-CM | POA: Diagnosis not present

## 2021-09-11 DIAGNOSIS — N2581 Secondary hyperparathyroidism of renal origin: Secondary | ICD-10-CM | POA: Diagnosis not present

## 2021-09-11 DIAGNOSIS — R82998 Other abnormal findings in urine: Secondary | ICD-10-CM | POA: Diagnosis not present

## 2021-09-11 DIAGNOSIS — N186 End stage renal disease: Secondary | ICD-10-CM | POA: Diagnosis not present

## 2021-09-11 DIAGNOSIS — D509 Iron deficiency anemia, unspecified: Secondary | ICD-10-CM | POA: Diagnosis not present

## 2021-09-11 DIAGNOSIS — Z79899 Other long term (current) drug therapy: Secondary | ICD-10-CM | POA: Diagnosis not present

## 2021-09-11 DIAGNOSIS — D631 Anemia in chronic kidney disease: Secondary | ICD-10-CM | POA: Diagnosis not present

## 2021-09-11 NOTE — Progress Notes (Signed)
[facility])  [place] Team    09/11/2021  Adetokunbo Mccadden Faeth November 23, 1965 [id number]  Reason for referral:  Assistance with finding pharmacy  Referral source:  [place]  Current insurance: [facility]  PMHx includes but not limited to:  Patient was recently discharged from the hospital for a NSTEMI and recent medication changes include starting Imdur and stopping Percocet. [place] received a referral to help patient find a Pharmacy that deliveries in a reasonable time.   Outreach:  Successful telephone call with patient.  HIPAA identifiers verified.   Brief Update:  I called and spoke with patient. Patient decided on getting prescriptions transferred from [facility] to [facility]. I was able to help facilitate prescription transfer of meds only filled at [facility] - per patient's request. Additionally, patient stated he didn't have any medications that needed to be filed at this time. Therefore, [employer] stated that will refill prescriptions when they are due.   Patient confirmed her picked up Imdur over the weekend.  Thank you for allowing pharmacy to be a part of this patient's care.  Kristeen Miss, PharmD Clinical Pharmacist [place] Cell: [phone]

## 2021-09-12 DIAGNOSIS — N2581 Secondary hyperparathyroidism of renal origin: Secondary | ICD-10-CM | POA: Diagnosis not present

## 2021-09-12 DIAGNOSIS — N186 End stage renal disease: Secondary | ICD-10-CM | POA: Diagnosis not present

## 2021-09-12 DIAGNOSIS — Z992 Dependence on renal dialysis: Secondary | ICD-10-CM | POA: Diagnosis not present

## 2021-09-12 DIAGNOSIS — D631 Anemia in chronic kidney disease: Secondary | ICD-10-CM | POA: Diagnosis not present

## 2021-09-12 DIAGNOSIS — R82998 Other abnormal findings in urine: Secondary | ICD-10-CM | POA: Diagnosis not present

## 2021-09-12 DIAGNOSIS — Z79899 Other long term (current) drug therapy: Secondary | ICD-10-CM | POA: Diagnosis not present

## 2021-09-12 DIAGNOSIS — D509 Iron deficiency anemia, unspecified: Secondary | ICD-10-CM | POA: Diagnosis not present

## 2021-09-13 DIAGNOSIS — N186 End stage renal disease: Secondary | ICD-10-CM | POA: Diagnosis not present

## 2021-09-13 DIAGNOSIS — Z992 Dependence on renal dialysis: Secondary | ICD-10-CM | POA: Diagnosis not present

## 2021-09-13 DIAGNOSIS — Z79899 Other long term (current) drug therapy: Secondary | ICD-10-CM | POA: Diagnosis not present

## 2021-09-13 DIAGNOSIS — D631 Anemia in chronic kidney disease: Secondary | ICD-10-CM | POA: Diagnosis not present

## 2021-09-13 DIAGNOSIS — N2581 Secondary hyperparathyroidism of renal origin: Secondary | ICD-10-CM | POA: Diagnosis not present

## 2021-09-13 DIAGNOSIS — R82998 Other abnormal findings in urine: Secondary | ICD-10-CM | POA: Diagnosis not present

## 2021-09-13 DIAGNOSIS — D509 Iron deficiency anemia, unspecified: Secondary | ICD-10-CM | POA: Diagnosis not present

## 2021-09-14 DIAGNOSIS — Z79899 Other long term (current) drug therapy: Secondary | ICD-10-CM | POA: Diagnosis not present

## 2021-09-14 DIAGNOSIS — D509 Iron deficiency anemia, unspecified: Secondary | ICD-10-CM | POA: Diagnosis not present

## 2021-09-14 DIAGNOSIS — R82998 Other abnormal findings in urine: Secondary | ICD-10-CM | POA: Diagnosis not present

## 2021-09-14 DIAGNOSIS — D631 Anemia in chronic kidney disease: Secondary | ICD-10-CM | POA: Diagnosis not present

## 2021-09-14 DIAGNOSIS — N186 End stage renal disease: Secondary | ICD-10-CM | POA: Diagnosis not present

## 2021-09-14 DIAGNOSIS — Z992 Dependence on renal dialysis: Secondary | ICD-10-CM | POA: Diagnosis not present

## 2021-09-14 DIAGNOSIS — N2581 Secondary hyperparathyroidism of renal origin: Secondary | ICD-10-CM | POA: Diagnosis not present

## 2021-09-15 DIAGNOSIS — N186 End stage renal disease: Secondary | ICD-10-CM | POA: Diagnosis not present

## 2021-09-15 DIAGNOSIS — D509 Iron deficiency anemia, unspecified: Secondary | ICD-10-CM | POA: Diagnosis not present

## 2021-09-15 DIAGNOSIS — N2581 Secondary hyperparathyroidism of renal origin: Secondary | ICD-10-CM | POA: Diagnosis not present

## 2021-09-15 DIAGNOSIS — Z992 Dependence on renal dialysis: Secondary | ICD-10-CM | POA: Diagnosis not present

## 2021-09-15 DIAGNOSIS — D631 Anemia in chronic kidney disease: Secondary | ICD-10-CM | POA: Diagnosis not present

## 2021-09-15 DIAGNOSIS — Z79899 Other long term (current) drug therapy: Secondary | ICD-10-CM | POA: Diagnosis not present

## 2021-09-15 DIAGNOSIS — R82998 Other abnormal findings in urine: Secondary | ICD-10-CM | POA: Diagnosis not present

## 2021-09-16 DIAGNOSIS — N2581 Secondary hyperparathyroidism of renal origin: Secondary | ICD-10-CM | POA: Diagnosis not present

## 2021-09-16 DIAGNOSIS — Z79899 Other long term (current) drug therapy: Secondary | ICD-10-CM | POA: Diagnosis not present

## 2021-09-16 DIAGNOSIS — R82998 Other abnormal findings in urine: Secondary | ICD-10-CM | POA: Diagnosis not present

## 2021-09-16 DIAGNOSIS — D631 Anemia in chronic kidney disease: Secondary | ICD-10-CM | POA: Diagnosis not present

## 2021-09-16 DIAGNOSIS — N186 End stage renal disease: Secondary | ICD-10-CM | POA: Diagnosis not present

## 2021-09-16 DIAGNOSIS — Z992 Dependence on renal dialysis: Secondary | ICD-10-CM | POA: Diagnosis not present

## 2021-09-16 DIAGNOSIS — D509 Iron deficiency anemia, unspecified: Secondary | ICD-10-CM | POA: Diagnosis not present

## 2021-09-17 DIAGNOSIS — Z79899 Other long term (current) drug therapy: Secondary | ICD-10-CM | POA: Diagnosis not present

## 2021-09-17 DIAGNOSIS — N186 End stage renal disease: Secondary | ICD-10-CM | POA: Diagnosis not present

## 2021-09-17 DIAGNOSIS — D509 Iron deficiency anemia, unspecified: Secondary | ICD-10-CM | POA: Diagnosis not present

## 2021-09-17 DIAGNOSIS — R82998 Other abnormal findings in urine: Secondary | ICD-10-CM | POA: Diagnosis not present

## 2021-09-17 DIAGNOSIS — N2581 Secondary hyperparathyroidism of renal origin: Secondary | ICD-10-CM | POA: Diagnosis not present

## 2021-09-17 DIAGNOSIS — Z992 Dependence on renal dialysis: Secondary | ICD-10-CM | POA: Diagnosis not present

## 2021-09-17 DIAGNOSIS — D631 Anemia in chronic kidney disease: Secondary | ICD-10-CM | POA: Diagnosis not present

## 2021-09-18 DIAGNOSIS — D631 Anemia in chronic kidney disease: Secondary | ICD-10-CM | POA: Diagnosis not present

## 2021-09-18 DIAGNOSIS — D509 Iron deficiency anemia, unspecified: Secondary | ICD-10-CM | POA: Diagnosis not present

## 2021-09-18 DIAGNOSIS — N2581 Secondary hyperparathyroidism of renal origin: Secondary | ICD-10-CM | POA: Diagnosis not present

## 2021-09-18 DIAGNOSIS — Z79899 Other long term (current) drug therapy: Secondary | ICD-10-CM | POA: Diagnosis not present

## 2021-09-18 DIAGNOSIS — R82998 Other abnormal findings in urine: Secondary | ICD-10-CM | POA: Diagnosis not present

## 2021-09-18 DIAGNOSIS — N186 End stage renal disease: Secondary | ICD-10-CM | POA: Diagnosis not present

## 2021-09-18 DIAGNOSIS — Z992 Dependence on renal dialysis: Secondary | ICD-10-CM | POA: Diagnosis not present

## 2021-09-19 DIAGNOSIS — Z992 Dependence on renal dialysis: Secondary | ICD-10-CM | POA: Diagnosis not present

## 2021-09-19 DIAGNOSIS — D631 Anemia in chronic kidney disease: Secondary | ICD-10-CM | POA: Diagnosis not present

## 2021-09-19 DIAGNOSIS — N2581 Secondary hyperparathyroidism of renal origin: Secondary | ICD-10-CM | POA: Diagnosis not present

## 2021-09-19 DIAGNOSIS — R82998 Other abnormal findings in urine: Secondary | ICD-10-CM | POA: Diagnosis not present

## 2021-09-19 DIAGNOSIS — Z79899 Other long term (current) drug therapy: Secondary | ICD-10-CM | POA: Diagnosis not present

## 2021-09-19 DIAGNOSIS — N186 End stage renal disease: Secondary | ICD-10-CM | POA: Diagnosis not present

## 2021-09-19 DIAGNOSIS — D509 Iron deficiency anemia, unspecified: Secondary | ICD-10-CM | POA: Diagnosis not present

## 2021-09-20 DIAGNOSIS — N186 End stage renal disease: Secondary | ICD-10-CM | POA: Diagnosis not present

## 2021-09-20 DIAGNOSIS — N2581 Secondary hyperparathyroidism of renal origin: Secondary | ICD-10-CM | POA: Diagnosis not present

## 2021-09-20 DIAGNOSIS — Z992 Dependence on renal dialysis: Secondary | ICD-10-CM | POA: Diagnosis not present

## 2021-09-20 DIAGNOSIS — Z79899 Other long term (current) drug therapy: Secondary | ICD-10-CM | POA: Diagnosis not present

## 2021-09-20 DIAGNOSIS — R82998 Other abnormal findings in urine: Secondary | ICD-10-CM | POA: Diagnosis not present

## 2021-09-20 DIAGNOSIS — D509 Iron deficiency anemia, unspecified: Secondary | ICD-10-CM | POA: Diagnosis not present

## 2021-09-20 DIAGNOSIS — D631 Anemia in chronic kidney disease: Secondary | ICD-10-CM | POA: Diagnosis not present

## 2021-09-21 ENCOUNTER — Emergency Department (HOSPITAL_COMMUNITY)
Admission: EM | Admit: 2021-09-21 | Discharge: 2021-09-21 | Disposition: A | Discharge status: Home / Self Care | Payer: Medicare Other | Attending: Emergency Medicine | Admitting: Emergency Medicine

## 2021-09-21 ENCOUNTER — Other Ambulatory Visit: Payer: Self-pay

## 2021-09-21 ENCOUNTER — Ambulatory Visit (HOSPITAL_COMMUNITY): Admission: EM | Admit: 2021-09-21 | Discharge: 2021-09-21 | Payer: Medicare Other

## 2021-09-21 ENCOUNTER — Encounter (HOSPITAL_COMMUNITY): Payer: Self-pay | Admitting: Emergency Medicine

## 2021-09-21 ENCOUNTER — Emergency Department (HOSPITAL_COMMUNITY): Payer: Medicare Other

## 2021-09-21 ENCOUNTER — Encounter (HOSPITAL_COMMUNITY): Payer: Self-pay

## 2021-09-21 DIAGNOSIS — Z992 Dependence on renal dialysis: Secondary | ICD-10-CM | POA: Insufficient documentation

## 2021-09-21 DIAGNOSIS — Z87891 Personal history of nicotine dependence: Secondary | ICD-10-CM | POA: Diagnosis not present

## 2021-09-21 DIAGNOSIS — R109 Unspecified abdominal pain: Secondary | ICD-10-CM

## 2021-09-21 DIAGNOSIS — N186 End stage renal disease: Secondary | ICD-10-CM | POA: Insufficient documentation

## 2021-09-21 DIAGNOSIS — Z85528 Personal history of other malignant neoplasm of kidney: Secondary | ICD-10-CM | POA: Insufficient documentation

## 2021-09-21 DIAGNOSIS — Z79899 Other long term (current) drug therapy: Secondary | ICD-10-CM | POA: Diagnosis not present

## 2021-09-21 DIAGNOSIS — I5033 Acute on chronic diastolic (congestive) heart failure: Secondary | ICD-10-CM | POA: Diagnosis not present

## 2021-09-21 DIAGNOSIS — D631 Anemia in chronic kidney disease: Secondary | ICD-10-CM | POA: Diagnosis not present

## 2021-09-21 DIAGNOSIS — N132 Hydronephrosis with renal and ureteral calculous obstruction: Secondary | ICD-10-CM | POA: Diagnosis not present

## 2021-09-21 DIAGNOSIS — I132 Hypertensive heart and chronic kidney disease with heart failure and with stage 5 chronic kidney disease, or end stage renal disease: Secondary | ICD-10-CM | POA: Insufficient documentation

## 2021-09-21 DIAGNOSIS — N281 Cyst of kidney, acquired: Secondary | ICD-10-CM | POA: Diagnosis not present

## 2021-09-21 DIAGNOSIS — Z7982 Long term (current) use of aspirin: Secondary | ICD-10-CM | POA: Insufficient documentation

## 2021-09-21 DIAGNOSIS — N3001 Acute cystitis with hematuria: Secondary | ICD-10-CM | POA: Diagnosis not present

## 2021-09-21 DIAGNOSIS — I700 Atherosclerosis of aorta: Secondary | ICD-10-CM | POA: Diagnosis not present

## 2021-09-21 DIAGNOSIS — D509 Iron deficiency anemia, unspecified: Secondary | ICD-10-CM | POA: Diagnosis not present

## 2021-09-21 DIAGNOSIS — D7389 Other diseases of spleen: Secondary | ICD-10-CM | POA: Diagnosis not present

## 2021-09-21 DIAGNOSIS — I10 Essential (primary) hypertension: Secondary | ICD-10-CM | POA: Diagnosis not present

## 2021-09-21 DIAGNOSIS — R1031 Right lower quadrant pain: Secondary | ICD-10-CM | POA: Diagnosis present

## 2021-09-21 DIAGNOSIS — R82998 Other abnormal findings in urine: Secondary | ICD-10-CM | POA: Diagnosis not present

## 2021-09-21 DIAGNOSIS — R188 Other ascites: Secondary | ICD-10-CM | POA: Diagnosis not present

## 2021-09-21 DIAGNOSIS — J45909 Unspecified asthma, uncomplicated: Secondary | ICD-10-CM | POA: Diagnosis not present

## 2021-09-21 DIAGNOSIS — N2581 Secondary hyperparathyroidism of renal origin: Secondary | ICD-10-CM | POA: Diagnosis not present

## 2021-09-21 LAB — COMPREHENSIVE METABOLIC PANEL
ALT: 13 U/L (ref 0–44)
AST: 13 U/L — ABNORMAL LOW (ref 15–41)
Albumin: 3.8 g/dL (ref 3.5–5.0)
Alkaline Phosphatase: 71 U/L (ref 38–126)
Anion gap: 19 — ABNORMAL HIGH (ref 5–15)
BUN: 96 mg/dL — ABNORMAL HIGH (ref 6–20)
CO2: 21 mmol/L — ABNORMAL LOW (ref 22–32)
Calcium: 9.1 mg/dL (ref 8.9–10.3)
Chloride: 95 mmol/L — ABNORMAL LOW (ref 98–111)
Creatinine, Ser: 20.17 mg/dL — ABNORMAL HIGH (ref 0.61–1.24)
GFR, Estimated: 2 mL/min — ABNORMAL LOW (ref >60)
Glucose, Bld: 118 mg/dL — ABNORMAL HIGH (ref 70–99)
Potassium: 3.7 mmol/L (ref 3.5–5.1)
Sodium: 135 mmol/L (ref 135–145)
Total Bilirubin: 0.7 mg/dL (ref 0.3–1.2)
Total Protein: 7.6 g/dL (ref 6.5–8.1)

## 2021-09-21 LAB — URINALYSIS, ROUTINE W REFLEX MICROSCOPIC
Bacteria, UA: NONE SEEN
Bilirubin Urine: NEGATIVE
Glucose, UA: 50 mg/dL — AB
Ketones, ur: NEGATIVE mg/dL
Nitrite: NEGATIVE
Protein, ur: 100 mg/dL — AB
RBC / HPF: >50 RBC/hpf — ABNORMAL HIGH (ref 0–5)
Specific Gravity, Urine: 1.012 (ref 1.005–1.030)
WBC, UA: >50 WBC/hpf — ABNORMAL HIGH (ref 0–5)
pH: 6 (ref 5.0–8.0)

## 2021-09-21 LAB — CBC WITH DIFFERENTIAL/PLATELET
Abs Immature Granulocytes: 0.05 10*3/uL (ref 0.00–0.07)
Basophils Absolute: 0 10*3/uL (ref 0.0–0.1)
Basophils Relative: 0 %
Eosinophils Absolute: 0.1 10*3/uL (ref 0.0–0.5)
Eosinophils Relative: 2 %
HCT: 24.3 % — ABNORMAL LOW (ref 39.0–52.0)
Hemoglobin: 8.2 g/dL — ABNORMAL LOW (ref 13.0–17.0)
Immature Granulocytes: 1 %
Lymphocytes Relative: 21 %
Lymphs Abs: 1.5 10*3/uL (ref 0.7–4.0)
MCH: 32.3 pg (ref 26.0–34.0)
MCHC: 33.7 g/dL (ref 30.0–36.0)
MCV: 95.7 fL (ref 80.0–100.0)
Monocytes Absolute: 0.8 10*3/uL (ref 0.1–1.0)
Monocytes Relative: 11 %
Neutro Abs: 4.8 10*3/uL (ref 1.7–7.7)
Neutrophils Relative %: 65 %
Platelets: 296 10*3/uL (ref 150–400)
RBC: 2.54 MIL/uL — ABNORMAL LOW (ref 4.22–5.81)
RDW: 16.7 % — ABNORMAL HIGH (ref 11.5–15.5)
WBC: 7.3 10*3/uL (ref 4.0–10.5)
nRBC: 0 % (ref 0.0–0.2)

## 2021-09-21 LAB — LACTIC ACID, PLASMA
Lactic Acid, Venous: 0.9 mmol/L (ref 0.5–1.9)
Lactic Acid, Venous: 1.3 mmol/L (ref 0.5–1.9)

## 2021-09-21 LAB — LIPASE, BLOOD: Lipase: 82 U/L — ABNORMAL HIGH (ref 11–51)

## 2021-09-21 IMAGING — CT CT RENAL STONE PROTOCOL
2 of 4 series · 16 of 46 positions shown, 18 images · non-contrast
Comparison: CT abdomen pelvis [DATE]

CLINICAL DATA: Right-sided pain with trouble voiding today. Suspect
kidney stone.

EXAM:
CT ABDOMEN AND PELVIS WITHOUT CONTRAST
TECHNIQUE: Multidetector CT imaging of the abdomen and pelvis was performed
following the standard protocol without IV contrast.

[Series 3: ap without · axial · non-contrast · 0.83mm/px · z∈[+780,+1185]mm · 13 of 93 slices shown, 15 images]
[im 6/93  soft-tissue]
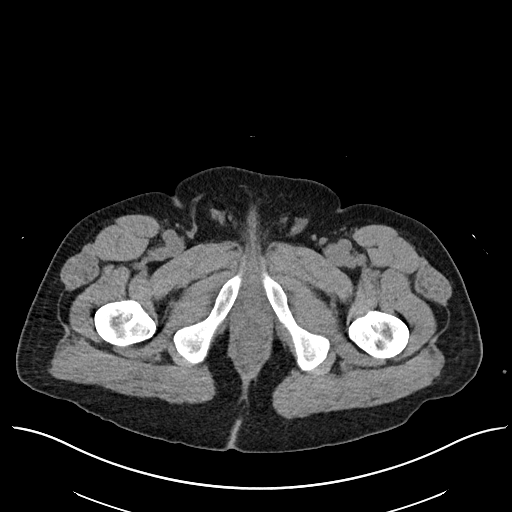
[im 6/93  bone]
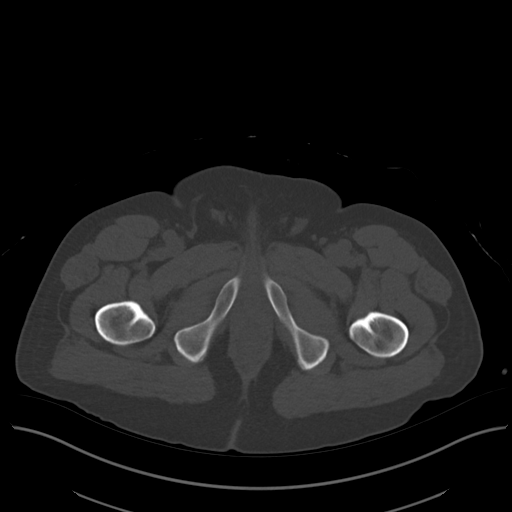
[im 11/93  soft-tissue]
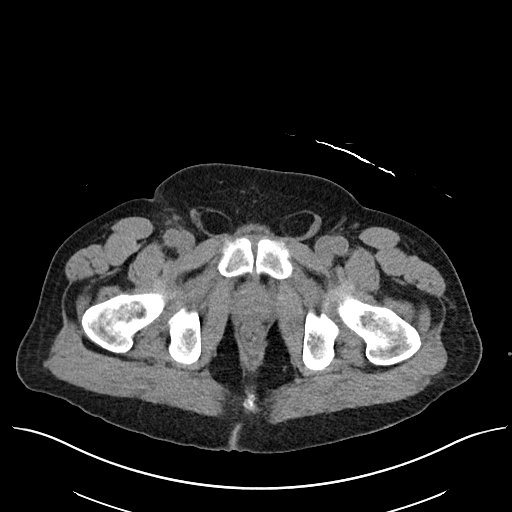
[im 21/93  soft-tissue]
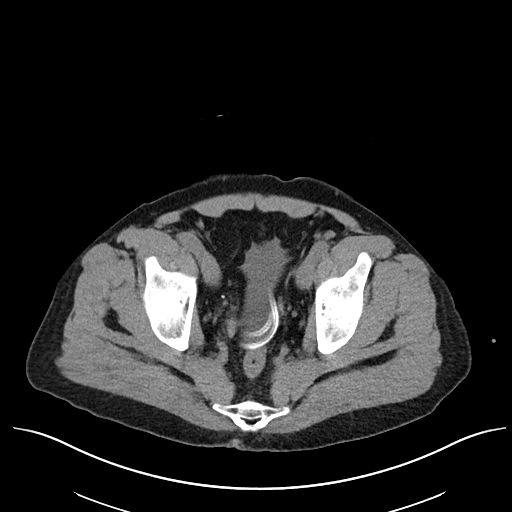
[im 26/93  soft-tissue]
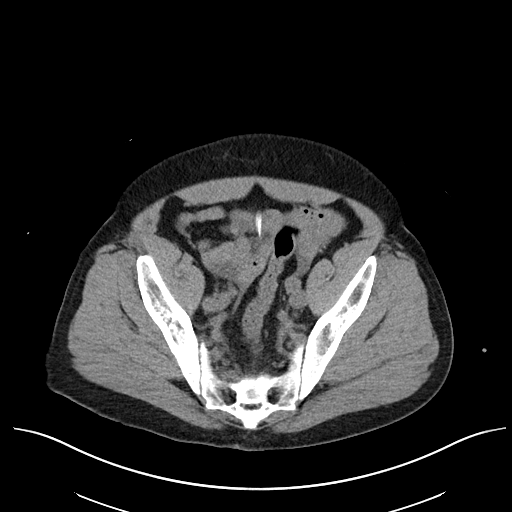
[im 31/93  soft-tissue]
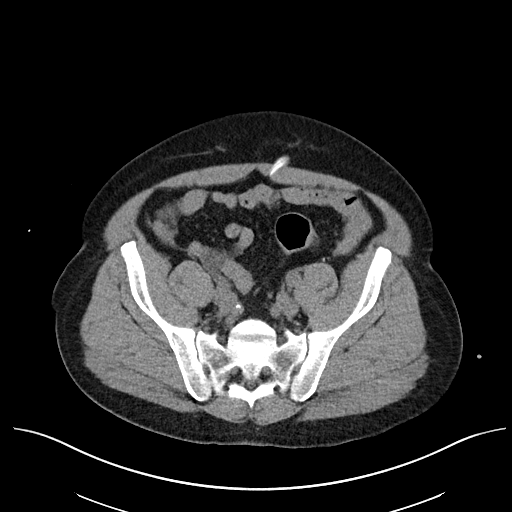
[im 41/93  soft-tissue]
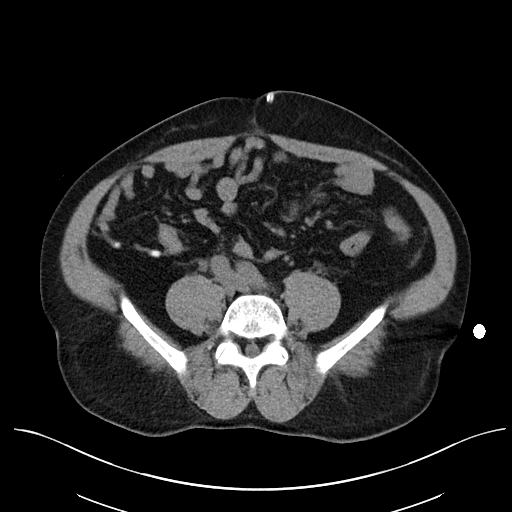
[im 47/93  soft-tissue]
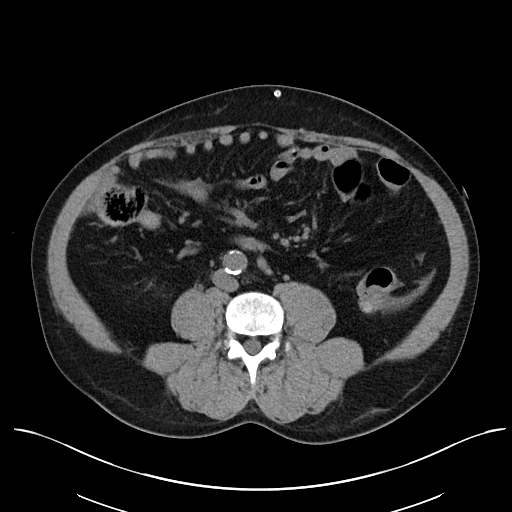
[im 52/93  soft-tissue]
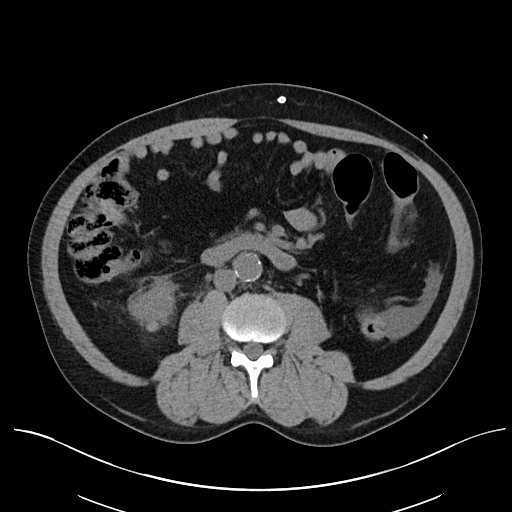
[im 62/93  soft-tissue]
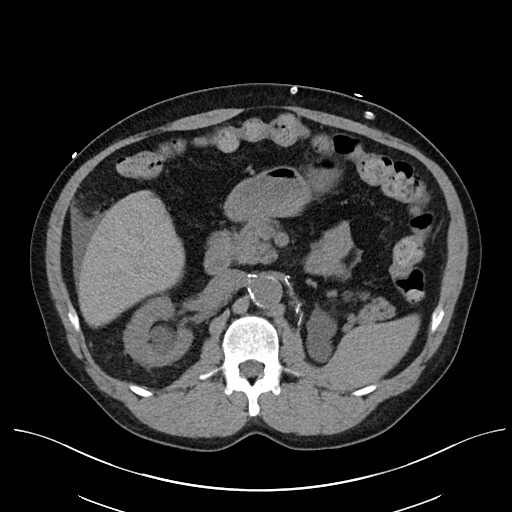
[im 62/93  bone]
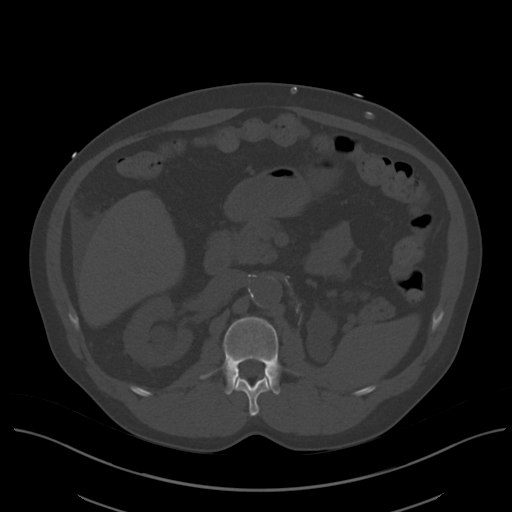
[im 67/93  soft-tissue]
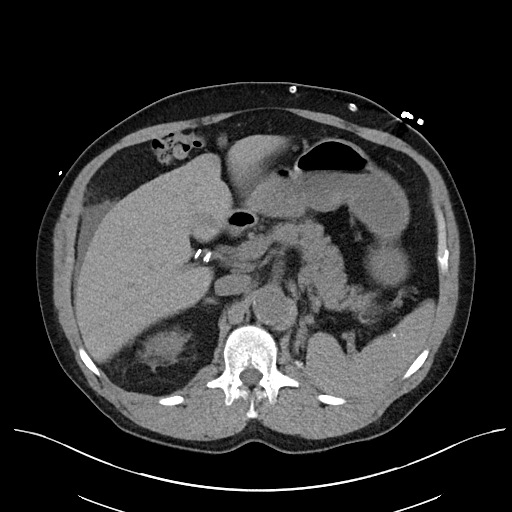
[im 72/93  soft-tissue]
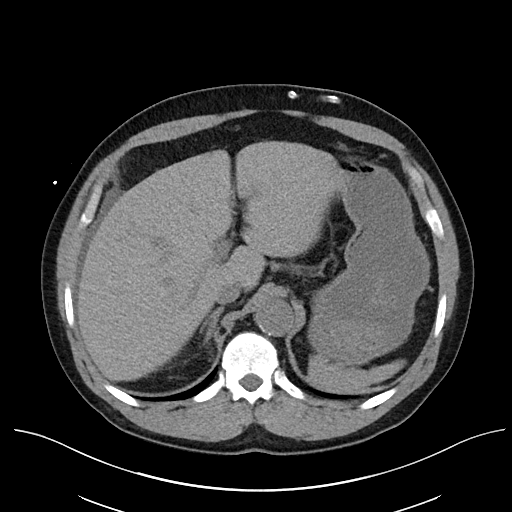
[im 82/93  soft-tissue]
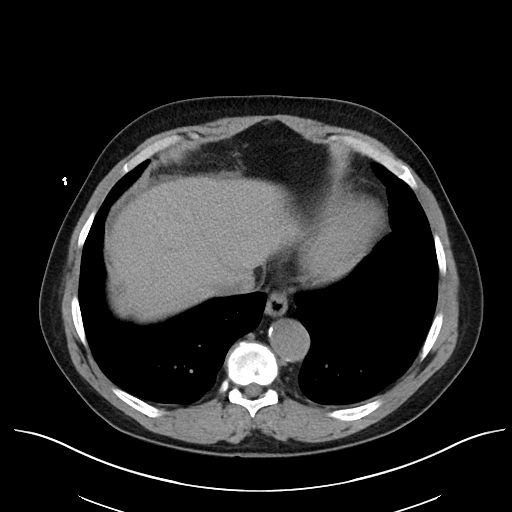
[im 87/93  soft-tissue]
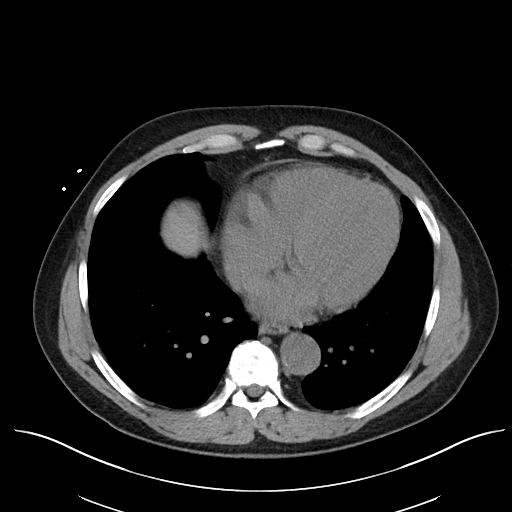

[Series 6: cor · coronal · 0.83mm/px · 3 of 101 slices shown]
[im 34/101  soft-tissue]
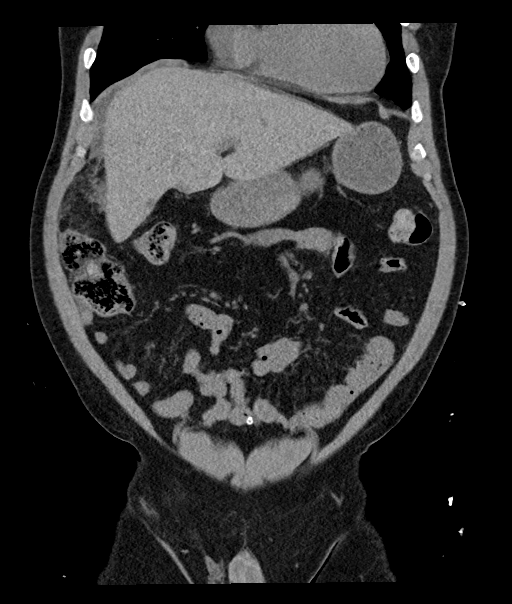
[im 45/101  soft-tissue]
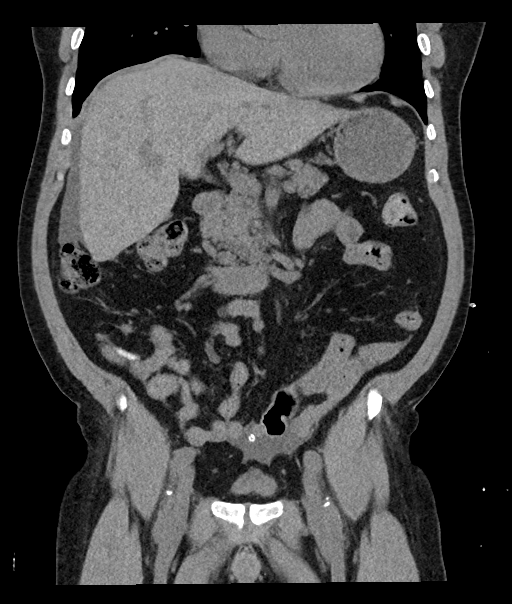
[im 56/101  soft-tissue]
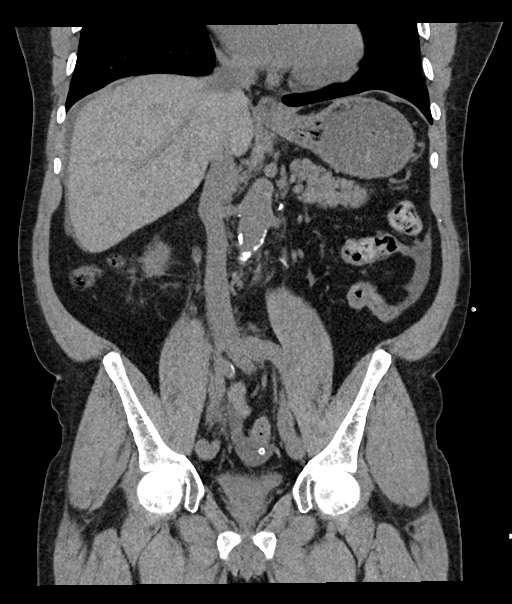

[16 of 46 positions shown; findings below may reference images not displayed]

FINDINGS: Lower chest: No acute abnormality.

Hepatobiliary: No focal liver abnormality is seen. Status post
cholecystectomy. No biliary dilatation.

Pancreas: Unremarkable. No pancreatic ductal dilatation or
surrounding inflammatory changes.

Spleen: Normal in size. Punctate calcified granuloma at the inferior
aspect.

Adrenals/Urinary Tract: Adrenal glands are unremarkable. Left
nephrectomy. Moderate right hydroureteronephrosis secondary to a
x 0.4 cm stone at the upper third of the right ureter (series 3,
image 40). The ureter downstream of the stone is normal caliber. No
other stones in the right renal collecting system. A 1.1 cm
exophytic hyperdense mass with attenuation of 50 Hounsfield units at
the posterior aspect of the lower pole of the right kidney is noted
(series 3, image 42).

Stomach/Bowel: Stomach is within normal limits. Appendix appears
normal. No evidence of bowel wall thickening, distention, or
inflammatory changes.

Vascular/Lymphatic: Mild scattered aortic atherosclerosis. No
enlarged abdominal or pelvic lymph nodes.

Reproductive: Prostate is unremarkable.

Other: Peritoneal dialysis catheter is in appropriate position
within the lower pelvis. Small amount of simple fluid is seen
throughout the abdomen and pelvis.

Musculoskeletal: No acute or significant osseous findings.
IMPRESSION: 1. A 0.4 x 0.4 cm stone within the upper third of the right kidney
results in moderate right hydroureteronephrosis. No perinephric
fluid collection.
2. Indeterminate 1.1 cm partially exophytic hyperdense lesion at the
lower pole of the right kidney. Finding is favored to represent a
hemorrhagic cyst given presence of cysts in this location on prior
MR [DATE]. When the patient is clinically stable and able to
follow directions and hold their breath (preferably as an
outpatient) further evaluation with dedicated abdominal MRI should
be considered.

## 2021-09-21 MED ORDER — CEPHALEXIN 500 MG PO CAPS: 500.0000 mg | ORAL_CAPSULE | Freq: Two times a day (BID) | ORAL | 0 refills | Status: AC

## 2021-09-21 MED ORDER — FENTANYL CITRATE PF 50 MCG/ML IJ SOSY
25.0000 ug | PREFILLED_SYRINGE | Freq: Once | INTRAMUSCULAR | Status: AC
  Administered 2021-09-21: 25 ug via INTRAVENOUS
  Filled 2021-09-21: qty 1

## 2021-09-21 MED ORDER — SODIUM CHLORIDE 0.9 % IV SOLN
1.0000 g | Freq: Once | INTRAVENOUS | Status: AC
  Administered 2021-09-21: 1 g via INTRAVENOUS
  Filled 2021-09-21: qty 10

## 2021-09-21 MED ORDER — FENTANYL CITRATE PF 50 MCG/ML IJ SOSY
50.0000 ug | PREFILLED_SYRINGE | Freq: Once | INTRAMUSCULAR | Status: AC
  Administered 2021-09-21: 50 ug via INTRAVENOUS
  Filled 2021-09-21: qty 1

## 2021-09-21 NOTE — ED Notes (Signed)
No urine when pt was in and out cathed.  Pt Bladder scanned with 45m present

## 2021-09-21 NOTE — ED Provider Notes (Signed)
Emergency Medicine Provider Triage Evaluation Note  Raffaele Derise Maudlin , a 55 y.o. male  was evaluated in triage.  Pt complains of dysuria.  Review of Systems  Positive: Right flank pain, dysuria Negative: Fever, vomiting  Physical Exam  There were no vitals taken for this visit. Gen:   Awake, no distress   Resp:  Normal effort  MSK:   Moves extremities without difficulty  Other:    Medical Decision Making  Medically screening exam initiated at 11:00 AM.  Appropriate orders placed.  Barrett Goldie Seal was informed that the remainder of the evaluation will be completed by another provider, this initial triage assessment does not replace that evaluation, and the importance of remaining in the ED until their evaluation is complete.  Patient with significant history of HIV as well as renal disease making minimal urine currently doing home peritoneal dialysis.  He is here with some dysuria as well as having right flank pain since this morning.  Was initially seen at urgent care and was found to be tachycardic with heart rate greater than 150 bpm sent here for further assessment. Normal HR here   Domenic Moras, PA-C 09/21/21 [place], [place], DO 09/21/21 1241

## 2021-09-21 NOTE — ED Notes (Signed)
Unable to give UA

## 2021-09-21 NOTE — ED Notes (Signed)
Ice chips, ice water, and warm blanket given to pt.

## 2021-09-21 NOTE — ED Triage Notes (Addendum)
C/o R flank pain since this morning with dysuria.  Sent from [facility] due to tachycardia.  HR normal on arrival.  Pt does home peritoneal dialysis.  Reports "specs" of blood in urine this morning.

## 2021-09-21 NOTE — ED Notes (Signed)
Patient transported to CT 

## 2021-09-21 NOTE — ED Provider Notes (Signed)
Patient evaluated through triage.  He has significant risk factors including being a dialysis patient for renal failure, HIV disease.  He is unable to void in our clinic at triage.  Was found to be tachycardic greater than 150 bpm.  Recommended further evaluation in the emergency room as there is significant concern for urosepsis given his tachycardia and flank pains.  Patient is agreeable, will head to the emergency room now.   Jaynee Eagles, PA-C 09/21/21 1030

## 2021-09-21 NOTE — ED Triage Notes (Signed)
Pt reports right side pain, he is having trouble with voiding today.

## 2021-09-21 NOTE — ED Notes (Signed)
Pt unable to give ua

## 2021-09-21 NOTE — ED Notes (Signed)
Pt unable to give UA at this time.

## 2021-09-21 NOTE — ED Notes (Signed)
E-signature pad unavailable at time of pt discharge. This RN discussed discharge materials with pt and answered all pt questions. Pt stated understanding of discharge material. ? ?

## 2021-09-21 NOTE — ED Provider Notes (Signed)
[facility] EMERGENCY DEPARTMENT Provider Note   CSN: [id number] Arrival date & time: 09/21/21  1038     History Chief Complaint  Patient presents with   Back Pain   Dysuria    Albert Eaton is a 55 y.o. male with a past medical history significant for chronic diastolic congestive heart failure, ESRD on peritoneal dialysis, HIV, hyperlipidemia, hypertension, and hypothyroidism who presents to the ED due to right flank and mid back pain that started yesterday.  Flank/back pain associated with urinary urgency and dysuria.  Patient states he produces minimal urine.  No fever or chills.  Endorses some intermittent nausea however, no vomiting.  Patient states he believes he has a kidney infection due to urinary urgency. Patient also noted some specks of blood in his urine this morning. No history of kidney stones. Patient last underwent peritoneal dialysis earlier this morning.  Patient was evaluated at urgent care prior to arrival and sent to the ED due to heart rate in the 150s however, upon arrival to the ED patient's heart rate had normalized.  No rash to flank region.  No recent injury.  Chart reviewed. Patient recently admitted to the hospital   History obtained from patient and past medical records. No interpreter used during encounter.       Past Medical History:  Diagnosis Date   Anemia    Aortic atherosclerosis (HCC)    Arthritis    Asthma    Cancer ([place])    renal cyst   Chronic diastolic CHF (congestive heart failure) ([place]) 01/06/2017   Echo 7/16 [facility] in [place], [name]) Mild AI, mild LAE, mild concentric LVH, EF 55, normal wall motion, mild to moderate MR, mild PI, RVSP 55 // Echo 10/09/16 ([facility]):  Moderate LVH, grade 2 diastolic dysfunction, mild MR, moderate LAE    CKD (chronic kidney disease)    Dialysis Mon Wed Fri   Esophagitis    GERD (gastroesophageal reflux disease)    Gout    no current problems   Heart murmur    never caused any  problems   Hepatitis    Hep B   History of nuclear stress test    a. Nuc study 7/16: no scar or ischemia, EF 42 // b. Nuc study 12/17: EF 48, ?small apical ischemia, Low Risk   HIV (human immunodeficiency virus infection) ([place])    HLD (hyperlipidemia)    Hypertension    Hypertensive heart disease with CHF (congestive heart failure) ([place]) 10/09/2016   Hypothyroidism    Mitral regurgitation    OSA (obstructive sleep apnea)    Post-operative nausea and vomiting    1 time as a child, no problems as an adult   Sleep apnea    uses c-pap   Thyroid disease    Wears glasses    Wears partial dentures     Patient Active Problem List   Diagnosis Date Noted   Chest pain of uncertain etiology    Elevated troponin    NSTEMI (non-ST elevated myocardial infarction) ([place]) 09/03/2021   Essential hypertension 09/03/2021   Renal cell carcinoma ([place]) 06/27/2021   SVT (supraventricular tachycardia) ([place]) 01/18/2021   Lower abdominal pain 06/26/2020   Pulsatile tinnitus of right ear 11/21/2019   Kidney failure 08/20/8526   Acute diastolic CHF (congestive heart failure) ([place]) 11/21/2019   Chills (without fever) 11/07/2019   Hypercalcemia 10/17/2019   Dependence on renal dialysis ([place]) 06/06/2019   Coagulation defect, unspecified ([place]) 05/23/2019   Iron deficiency  anemia, unspecified 05/16/2019   Anemia in other chronic diseases classified elsewhere 05/13/2019   Arteriovenous fistula, acquired ([place]) 05/13/2019   Body mass index (BMI) 28.0-28.9, adult 05/13/2019   Crohn's disease of small intestine without complications ([place]) 65/99/3570   Encounter for screening for respiratory tuberculosis 05/13/2019   Gout, unspecified 05/13/2019   Nausea 05/13/2019   Other fatigue 05/13/2019   Secondary hyperparathyroidism of renal origin ([place]) 05/13/2019   End stage renal disease ([place]) 05/13/2019   Chronic diastolic (congestive) heart failure ([place]) 05/13/2019   Chronic kidney disease, unspecified 05/13/2019    Obstructive sleep apnea 05/13/2019   Discomfort of right ear 02/18/2019   Hypokalemia 06/13/2018   Abnormal CT scan, small bowel    Perennial allergic rhinitis with a predominant nonallergic component 02/09/2018   Allergic conjunctivitis 02/09/2018   Mild intermittent asthma 02/09/2018   Atherosclerosis 12/09/2017   Hypothyroidism 11/19/2017   Mixed hyperlipidemia 10/13/2017   Personal history of gout 10/13/2017   S/P cholecystectomy 10/13/2017   Congestive heart failure with LV diastolic dysfunction, NYHA class 1 ([place]) 10/13/2017   Hypothyroidism (acquired) 10/13/2017   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   SOB (shortness of breath) 06/07/2017   Arthritis, gouty 01/27/2017   Chronic diastolic CHF (congestive heart failure) ([place]) 01/06/2017   OSA (obstructive sleep apnea) 01/06/2017   Abnormal electrocardiogram (ECG) (EKG) 10/31/2016   Chest pain 10/09/2016   HIV disease ([place]) 10/09/2016   Hypertensive heart disease with CHF (congestive heart failure) ([place]) 10/09/2016   Left-sided low back pain without sciatica 05/29/2015   NYHA class 2 heart failure with preserved ejection fraction ([place]) 05/15/2015   PAH (pulmonary artery hypertension) ([place]) 05/08/2015   Severe mitral regurgitation 05/04/2015   Prolonged Q-T interval on ECG 05/04/2015    Past Surgical History:  Procedure Laterality Date   AV FISTULA PLACEMENT Left 07/02/2018   Procedure: Creation of Left arm BRACHIOBASILIC ARTERIOVENOUS  FISTULA;  Surgeon: Marty Heck, MD;  Location: [facility];  Service: Vascular;  Laterality: Left;   [place] Left 08/27/2018   Procedure: Left arm BRACHIOBASILIC VEIN TRANSPOSITION SECOND STAGE;  Surgeon: Marty Heck, MD;  Location: [facility] OR;  Service: Vascular;  Laterality: Left;   BUBBLE STUDY  09/03/2020   Procedure: BUBBLE STUDY;  Surgeon: Fay Records, MD;  Location: [facility];  Service: Cardiovascular;;   CAPD INSERTION N/A 05/30/2021   Procedure:  LAPAROSCOPIC INSERTION CONTINUOUS AMBULATORY PERITONEAL DIALYSIS  (CAPD) CATHETER;  Surgeon: [name], MD;  Location: [facility] OR;  Service: Vascular;  Laterality: N/A;   CHOLECYSTECTOMY     COLONOSCOPY W/ BIOPSIES AND POLYPECTOMY     GIVENS CAPSULE STUDY N/A 03/01/2018   Procedure: GIVENS CAPSULE STUDY;  Surgeon: Jerene Bears, MD;  Location: [facility];  Service: Gastroenterology;  Laterality: N/A;   HERNIA REPAIR     As baby   MULTIPLE TOOTH EXTRACTIONS     NEPHRECTOMY Left 02/18/2021   NOSE SURGERY     RENAL BIOPSY     RIGHT/LEFT HEART CATH AND CORONARY ANGIOGRAPHY N/A 09/04/2021   Procedure: RIGHT/LEFT HEART CATH AND CORONARY ANGIOGRAPHY;  Surgeon: Burnell Blanks, MD;  Location: [facility] CV LAB;  Service: Cardiovascular;  Laterality: N/A;   TEE WITHOUT CARDIOVERSION N/A 09/03/2020   Procedure: TRANSESOPHAGEAL ECHOCARDIOGRAM (TEE);  Surgeon: Fay Records, MD;  Location[facility] ENDOSCOPY;  Service: Cardiovascular;  Laterality: N/A;   UPPER GI ENDOSCOPY  04/05/2021       Family History  Problem Relation Age of Onset  Heart failure Mother    Heart attack Maternal Grandmother 77   Hypertension Sister    Multiple sclerosis Sister    Hypertension Brother    Hypertension Sister    Scoliosis Other    Allergic rhinitis Neg Hx    Angioedema Neg Hx    Asthma Neg Hx    Eczema Neg Hx    Immunodeficiency Neg Hx    Urticaria Neg Hx    Colon cancer Neg Hx    Pancreatic cancer Neg Hx    Esophageal cancer Neg Hx     Social History   Tobacco Use   Smoking status: Former    Packs/day: 1.00    Types: Cigarettes    Quit date: 2000    Years since quitting: 22.9   Smokeless tobacco: Never  Vaping Use   Vaping Use: Never used  Substance Use Topics   Alcohol use: Yes    Comment: occasionally liquor   Drug use: No    Home Medications Prior to Admission medications   Medication Sig Start Date End Date Taking? Authorizing Provider  ACETAMINOPHEN EXTRA STRENGTH 500 MG  tablet Take 500 mg by mouth every 6 (six) hours as needed (pain.). 02/20/21   [provider]  albuterol (PROAIR HFA) 108 (90 Base) MCG/ACT inhaler INHALE 2 PUFFS INTO THE LUNGS EVERY 4 HOURS AS NEEDED FOR WHEEZING OR SHORTNESS OF BREATH 08/07/21   Valentina Shaggy, MD  allopurinol (ZYLOPRIM) 100 MG tablet TAKE 1 TABLET(100 MG) BY MOUTH TWICE DAILY FOR 60 DOSES 09/03/21   [name], FNP  amLODipine (NORVASC) 10 MG tablet Take 1 tablet (10 mg total) by mouth daily. Patient taking differently: Take 10 mg by mouth daily at 12 noon. At midday 10/31/16   [name], FNP  aspirin EC 81 MG tablet Take 1 tablet (81 mg total) by mouth daily. Swallow whole. Patient taking differently: Take 81 mg by mouth in the morning. Swallow whole. 03/06/21   Richardson Dopp T, PA-C  azelastine (ASTELIN) 0.1 % nasal spray PLACE 2 SPRAYS INTO EACH NOSTRIL TWICE DAILY AS NEEDED FOR RHINITIS AS DIRECTED Patient taking differently: Place 2 sprays into both nostrils 2 (two) times daily as needed for allergies or rhinitis. 06/05/21   Valentina Shaggy, MD  BIKTARVY [phone] MG TABS tablet TAKE 1 TABLET BY MOUTH DAILY 07/08/21   Campbell Riches, MD  calcitRIOL (ROCALTROL) 0.5 MCG capsule Take 0.5 mcg by mouth every Monday, Wednesday, and Friday. 02/06/21   [provider]  carvedilol (COREG) 25 MG tablet TAKE 1 TABLET BY MOUTH TWICE DAILY WITH FOOD Patient taking differently: Take 25 mg by mouth 2 (two) times daily with a meal. 12/31/20   [name], MD  chlorproMAZINE (THORAZINE) 10 MG tablet Take 1 tablet (10 mg total) by mouth 3 (three) times daily as needed for hiccoughs. Patient taking differently: Take 10 mg by mouth 3 (three) times daily. 02/26/21   [name], FNP  clobetasol (OLUX) 0.05 % topical foam Apply topically 2 (two) times daily. 08/21/21   Lavonna Monarch, MD  clonazePAM (KLONOPIN) 0.5 MG tablet Take 1 tablet (0.5 mg total) by mouth at bedtime. 01/02/21   Laurin Coder, MD  clotrimazole-betamethasone (LOTRISONE) cream Apply topically 2 (two) times daily. X 3-5 days. IF symptoms continue, call office Patient taking differently: Apply 1 application topically 2 (two) times daily as needed (rectal discomfort). 05/16/21   Pyrtle, Lajuan Lines, MD  docusate sodium (COLACE) 100 MG capsule Take 100 mg  by mouth 2 (two) times daily. 02/20/21   [provider]  fluticasone (FLOVENT HFA) 110 MCG/ACT inhaler INHALE 2 PUFFS INTO THE LUNGS TWICE DAILY 06/24/21   Valentina Shaggy, MD  gabapentin (NEURONTIN) 300 MG capsule TAKE 1 CAPSULE(300 MG) BY MOUTH THREE TIMES DAILY Patient taking differently: Take 300 mg by mouth 3 (three) times daily. 10/09/20   [name], FNP  gentamicin cream (GARAMYCIN) 0.1 % Apply 1 application topically See admin instructions. Apply when catheter is flushed and cleaned 05/28/21   [provider]  hydrALAZINE (APRESOLINE) 100 MG tablet Take 1 tablet (100 mg total) by mouth 3 (three) times daily. 04/02/21 04/02/22  [name], MD  hyoscyamine (LEVSIN SL) 0.125 MG SL tablet Place 1-2 tablets (0.125-0.25 mg total) under the tongue every 8 (eight) hours as needed (lower abdominal pain, spasm). 08/05/19   Pyrtle, Lajuan Lines, MD  ipratropium (ATROVENT) 0.06 % nasal spray USE 2 SPRAYS IN EACH NOSTRIL 2 TO 3 TIMES DAILY AS NEEDED Patient taking differently: Place 2 sprays into both nostrils 3 (three) times daily as needed (allergies). 09/03/20   Valentina Shaggy, MD  iron sucrose (VENOFER) 20 MG/ML injection Inject into the vein. 07/12/20   [provider]  isosorbide mononitrate (IMDUR) 30 MG 24 hr tablet Take 1 tablet (30 mg total) by mouth daily. 09/05/21   Patrecia Pour, MD  lactulose (CHRONULAC) 10 GM/15ML solution Take 20 g by mouth daily as needed for moderate constipation.  07/08/20   [provider]  levothyroxine (SYNTHROID) 25 MCG tablet TAKE 1 TABLET BY MOUTH DAILY BEFORE BREAKFAST Patient taking differently: Take  25 mcg by mouth daily before breakfast. 02/05/21   [name], FNP  lidocaine-prilocaine (EMLA) cream Apply 1 application topically See admin instructions. Apply to access site 1-2 hours before dialysis 06/01/19   [provider]  lubiprostone (AMITIZA) 24 MCG capsule TAKE 1 CAPSULE(24 MCG) BY MOUTH TWICE DAILY WITH A MEAL Patient taking differently: Take 24 mcg by mouth in the morning and at bedtime. 05/21/21   Pyrtle, Lajuan Lines, MD  methocarbamol (ROBAXIN) 500 MG tablet Take 1 tablet (500 mg total) by mouth every 8 (eight) hours as needed for muscle spasms. Patient taking differently: Take 500 mg by mouth at bedtime. 02/11/21   Maudie Flakes, MD  Methoxy PEG-Epoetin Beta (MIRCERA IJ) Inject into the skin. 11/08/20   [provider]  multivitamin (RENA-VIT) TABS tablet Take 1 tablet by mouth at bedtime. 01/10/21   [provider]  mupirocin ointment (BACTROBAN) 2 % Apply 1 application topically daily as needed (apply to port after dialysis).  04/24/20   [provider]  Olopatadine HCl (PAZEO) 0.7 % SOLN Place 1 drop into both eyes daily. Patient taking differently: Place 1 drop into both eyes daily as needed (itchy eyes). 05/24/20   Valentina Shaggy, MD  pantoprazole (PROTONIX) 40 MG tablet TAKE 1 TABLET(40 MG) BY MOUTH DAILY 08/07/21   Esterwood, Amy S, PA-C  predniSONE (DELTASONE) 10 MG tablet Take 1 tablet (10 mg total) by mouth daily with breakfast. 08/06/21   Suzan Slick, NP  rOPINIRole (REQUIP) 0.5 MG tablet Take 0.5 mg by mouth at bedtime. 02/05/21   [provider]  rOPINIRole (REQUIP) 1 MG tablet Take 1 tablet (1 mg total) by mouth at bedtime. 08/12/21   [place]alere, Cicero Duck A, MD  rosuvastatin (CRESTOR) 10 MG tablet Take 1 tablet (10 mg total) by mouth daily. Patient taking differently: Take 10 mg by mouth  in the morning. 03/06/21 06/17/21  Richardson Dopp T, PA-C  senna (SENOKOT) 8.6 MG TABS tablet Take 2 tablets by mouth every evening. 02/20/21    [provider]  sevelamer carbonate (RENVELA) 800 MG tablet Take 1,600 mg by mouth 3 (three) times daily with meals. 11/09/20   [provider]  Spacer/Aero-Holding Josiah Lobo DEVI 1 Device by Does not apply route 2 (two) times a day. 03/01/19   Bobbitt, Sedalia Muta, MD  Vitamin D, Ergocalciferol, (DRISDOL) 1.25 MG (50000 UNIT) CAPS capsule TAKE 1 CAPSULE BY MOUTH EVERY 7 DAYS Patient taking differently: Take 50,000 Units by mouth every Tuesday. At midday 02/05/21   [name], FNP    Allergies    Ace inhibitors and Lisinopril  Review of Systems   Review of Systems  Constitutional:  Negative for chills and fever.  Respiratory:  Negative for shortness of breath.   Cardiovascular:  Negative for chest pain.  Gastrointestinal:  Positive for nausea. Negative for abdominal pain, diarrhea and vomiting.  Genitourinary:  Positive for dysuria, flank pain and urgency.  Musculoskeletal:  Positive for back pain.  All other systems reviewed and are negative.  Physical Exam Updated Vital Signs BP (!) 132/95   Pulse 87   Temp 98 F (36.7 C) (Oral)   Resp 15   SpO2 98%   Physical Exam Vitals and nursing note reviewed.  Constitutional:      General: He is not in acute distress.    Appearance: He is not ill-appearing.  HENT:     Head: Normocephalic.  Eyes:     Pupils: Pupils are equal, round, and reactive to light.  Cardiovascular:     Rate and Rhythm: Normal rate and regular rhythm.     Pulses: Normal pulses.     Heart sounds: Normal heart sounds. No murmur heard.   No friction rub. No gallop.  Pulmonary:     Effort: Pulmonary effort is normal.     Breath sounds: Normal breath sounds.  Abdominal:     General: Abdomen is flat. There is no distension.     Palpations: Abdomen is soft.     Tenderness: There is abdominal tenderness. There is right CVA tenderness. There is no guarding or rebound.     Comments: +RUQ tenderness. Right CVA tenderness  Musculoskeletal:         General: Normal range of motion.     Cervical back: Neck supple.  Skin:    General: Skin is warm and dry.  Neurological:     General: No focal deficit present.     Mental Status: He is alert.  Psychiatric:        Mood and Affect: Mood normal.        Behavior: Behavior normal.    ED Results / Procedures / Treatments   Labs (all labs ordered are listed, but only abnormal results are displayed) Labs Reviewed  CBC WITH DIFFERENTIAL/PLATELET - Abnormal; Notable for the following components:      Result Value   RBC 2.54 (*)    Hemoglobin 8.2 (*)    HCT 24.3 (*)    RDW 16.7 (*)    All other components within normal limits  COMPREHENSIVE METABOLIC PANEL - Abnormal; Notable for the following components:   Chloride 95 (*)    CO2 21 (*)    Glucose, Bld 118 (*)    BUN 96 (*)    Creatinine, Ser 20.17 (*)    AST 13 (*)    GFR, Estimated 2 (*)  Anion gap 19 (*)    All other components within normal limits  LIPASE, BLOOD - Abnormal; Notable for the following components:   Lipase 82 (*)    All other components within normal limits  LACTIC ACID, PLASMA  URINALYSIS, ROUTINE W REFLEX MICROSCOPIC  LACTIC ACID, PLASMA    EKG None  Radiology CT Renal Stone Study  Result Date: 09/21/2021 CLINICAL DATA:  Right-sided pain with trouble voiding today. Suspect kidney stone. EXAM: CT ABDOMEN AND PELVIS WITHOUT CONTRAST TECHNIQUE: Multidetector CT imaging of the abdomen and pelvis was performed following the standard protocol without IV contrast. COMPARISON:  CT abdomen pelvis 06/17/2021 FINDINGS: Lower chest: No acute abnormality. Hepatobiliary: No focal liver abnormality is seen. Status post cholecystectomy. No biliary dilatation. Pancreas: Unremarkable. No pancreatic ductal dilatation or surrounding inflammatory changes. Spleen: Normal in size. Punctate calcified granuloma at the inferior aspect. Adrenals/Urinary Tract: Adrenal glands are unremarkable. Left nephrectomy. Moderate right  hydroureteronephrosis secondary to a 0.4 x 0.4 cm stone at the upper third of the right ureter (series 3, image 40). The ureter downstream of the stone is normal caliber. No other stones in the right renal collecting system. A 1.1 cm exophytic hyperdense mass with attenuation of 50 Hounsfield units at the posterior aspect of the lower pole of the right kidney is noted (series 3, image 42). Stomach/Bowel: Stomach is within normal limits. Appendix appears normal. No evidence of bowel wall thickening, distention, or inflammatory changes. Vascular/Lymphatic: Mild scattered aortic atherosclerosis. No enlarged abdominal or pelvic lymph nodes. Reproductive: Prostate is unremarkable. Other: Peritoneal dialysis catheter is in appropriate position within the lower pelvis. Small amount of simple fluid is seen throughout the abdomen and pelvis. Musculoskeletal: No acute or significant osseous findings. IMPRESSION: 1. A 0.4 x 0.4 cm stone within the upper third of the right kidney results in moderate right hydroureteronephrosis. No perinephric fluid collection. 2. Indeterminate 1.1 cm partially exophytic hyperdense lesion at the lower pole of the right kidney. Finding is favored to represent a hemorrhagic cyst given presence of cysts in this location on prior MR 11/25/2020. When the patient is clinically stable and able to follow directions and hold their breath (preferably as an outpatient) further evaluation with dedicated abdominal MRI should be considered. Electronically Signed   By: Ileana Roup M.D.   On: 09/21/2021 13:35    Procedures Procedures   Medications Ordered in ED Medications  fentaNYL (SUBLIMAZE) injection 25 mcg (25 mcg Intravenous Given 09/21/21 1318)  fentaNYL (SUBLIMAZE) injection 50 mcg (50 mcg Intravenous Given 09/21/21 1503)    ED Course  I have reviewed the triage vital signs and the nursing notes.  Pertinent labs & imaging results that were available during my care of the patient were  reviewed by me and considered in my medical decision making (see chart for details).    MDM Rules/Calculators/A&P                           55 year old male presents to the ED due to right flank/mid back pain that started yesterday associated with urinary urgency and dysuria.  Patient is an ESRD patient on peritoneal dialysis and produces minimal urine.  He also endorses specks of blood in his urine earlier today.  No history of kidney stones.  Patient initially evaluated at urgent care prior to arrival however, sent to the ED due to tachycardia.  Upon arrival, patient afebrile, not tachycardic or hypoxic.  Patient in no acute distress.  Positive right  CVA tenderness. +RUQ tenderness. Patient has a history of previous cholecystectomy. labs ordered at triage.  CT renal study to rule out kidney stone versus signs of pyelonephritis versus other acute intra-abdominal abnormalities.  Lipase mildly elevated 82.  CBC significant for anemia with hemoglobin 8.2 which appears to be around patient's baseline likely due to chronic kidney disease.  No leukocytosis.  CMP significant for elevated creatinine and BUN likely due to ESRD.  Hyperglycemia 118. CT renal study personally reviewed which demonstrates: IMPRESSION:  1. A 0.4 x 0.4 cm stone within the upper third of the right kidney  results in moderate right hydroureteronephrosis. No perinephric  fluid collection.  2. Indeterminate 1.1 cm partially exophytic hyperdense lesion at the  lower pole of the right kidney. Finding is favored to represent a  hemorrhagic cyst given presence of cysts in this location on prior  MR 11/25/2020. When the patient is clinically stable and able to  follow directions and hold their breath (preferably as an  outpatient) further evaluation with dedicated abdominal MRI should  be considered.   Patient has had no document episodes of tachycardia here in the ED. Low suspicion for cardiac or pulmonary etiology of flank pain. Low  suspicion for DVT/PE given no tachypnea or tachycardia.  RN attempted in and out with no urine. Bladder scan performed with no urine. Will give po fluids and wait for urine to rule out infection.  Patient handed off to [facility], PA-C at shift change pending UA. If positive, discharge with antibiotics. Final Clinical Impression(s) / ED Diagnoses Final diagnoses:  Right flank pain  Renal cyst    Rx / DC Orders ED Discharge Orders     None        Karie Kirks 09/21/21 1518    Lucrezia Starch, MD 09/22/21 [phone]

## 2021-09-21 NOTE — ED Provider Notes (Signed)
Patient is a 55 year old male whose care was transferred to me at shift change from Albert Eaton.  Her HPI is below:  Albert Eaton is a 55 y.o. male with a past medical history significant for chronic diastolic congestive heart failure, ESRD on peritoneal dialysis, HIV, hyperlipidemia, hypertension, and hypothyroidism who presents to the ED due to right flank and mid back pain that started yesterday.  Flank/back pain associated with urinary urgency and dysuria.  Patient states he produces minimal urine.  No fever or chills.  Endorses some intermittent nausea however, no vomiting.  Patient states he believes he has a kidney infection due to urinary urgency. Patient also noted some specks of blood in his urine this morning. No history of kidney stones. Patient last underwent peritoneal dialysis earlier this morning.  Patient was evaluated at urgent care prior to arrival and sent to the ED due to heart rate in the 150s however, upon arrival to the ED patient's heart rate had normalized.  No rash to flank region.  No recent injury.   Chart reviewed. Patient recently admitted to the hospital    History obtained from patient and past medical records. No interpreter used during encounter.  Physical Exam  BP (!) 132/95   Pulse 87   Temp 98 F (36.7 C) (Oral)   Resp 15   SpO2 98%   Physical Exam Vitals and nursing note reviewed.  Constitutional:      General: He is not in acute distress.    Appearance: He is not ill-appearing.  HENT:     Head: Normocephalic.  Eyes:     Pupils: Pupils are equal, round, and reactive to light.  Cardiovascular:     Rate and Rhythm: Normal rate and regular rhythm.     Pulses: Normal pulses.     Heart sounds: Normal heart sounds. No murmur heard.   No friction rub. No gallop.  Pulmonary:     Effort: Pulmonary effort is normal.     Breath sounds: Normal breath sounds.  Abdominal:     General: Abdomen is flat. There is no distension.     Palpations: Abdomen is  soft.     Tenderness: There is abdominal tenderness. There is right CVA tenderness. There is no guarding or rebound.     Comments: +RUQ tenderness. Right CVA tenderness  Musculoskeletal:        General: Normal range of motion.     Cervical back: Neck supple.  Skin:    General: Skin is warm and dry.  Neurological:     General: No focal deficit present.     Mental Status: He is alert.  Psychiatric:        Mood and Affect: Mood normal.        Behavior: Behavior normal.  ED Course/Procedures   Clinical Course as of 09/21/21 2039  Sat Sep 21, 2021  1745 Patient reassessed.  Notes adequate control of his pain.  Patient unable to provide a urine sample.  Patient is going to reattempt.  If unable to provide, will empirically treat for UTI. [LJ]  1944 Patient able to now provide a urine sample.  Pain still appears to be well controlled.  Denies any penile discharge.  Denies any sexual activity. [LJ]    Clinical Course User Index [LJ] Rayna Sexton, PA-C    Procedures  MDM  Patient is a 55 year old male whose care was transferred me at shift change from prior PA-C.  Please see her note below for additional information.  In summary, patient has a history of ESRD on peritoneal dialysis.  He produces minimal urine.  He comes in today due to right flank/mid back pain as well as urinary urgency and dysuria.  Also notes hematuria that began earlier today.  No history of kidney stones.  Upon arrival patient afebrile, not tachycardic, as well as not hypoxic.  No acute distress noted.  He did have right upper quadrant tenderness as well as CVA tenderness so a CT renal stone study was obtained with findings as noted below:   IMPRESSION:  1. A 0.4 x 0.4 cm stone within the upper third of the right kidney  results in moderate right hydroureteronephrosis. No perinephric  fluid collection.  2. Indeterminate 1.1 cm partially exophytic hyperdense lesion at the  lower pole of the right kidney. Finding is  favored to represent a  hemorrhagic cyst given presence of cysts in this location on prior  MR 11/25/2020. When the patient is clinically stable and able to  follow directions and hold their breath (preferably as an  outpatient) further evaluation with dedicated abdominal MRI should  be considered.    At the time of shift change patient has been given oral fluids and is pending a UA.  UA obtained and has since resulted and appears infectious.  It is showing 50 glucose, moderate hemoglobin, 100 protein, large leukocytes, greater than 50 red blood cells, greater than 50 white blood cells.  Will treat with IV Rocephin in the ED and discharged on a course of Keflex.  Feel that the patient is stable for discharge at this time and he is agreeable.  We discussed return precautions.  His questions were answered and he was amicable at the time of discharge.    Rayna Sexton, PA-C 09/21/21 2041    Malvin Johns, MD 09/21/21 [phone]

## 2021-09-21 NOTE — Discharge Instructions (Addendum)
I am prescribing you an antibiotic called Keflex.  Please take this twice a day for the next 10 days.  Do not stop taking this early.  Your CT scan showed a renal cyst. Please follow-up with your PCP for further evaluation.  You will likely need to have an MRI of your abdomen to have this reevaluated.  If you develop any new or worsening symptoms please come back to the emergency department.  It was a pleasure to meet you.

## 2021-09-22 DIAGNOSIS — N186 End stage renal disease: Secondary | ICD-10-CM | POA: Diagnosis not present

## 2021-09-22 DIAGNOSIS — D631 Anemia in chronic kidney disease: Secondary | ICD-10-CM | POA: Diagnosis not present

## 2021-09-22 DIAGNOSIS — Z992 Dependence on renal dialysis: Secondary | ICD-10-CM | POA: Diagnosis not present

## 2021-09-22 DIAGNOSIS — D509 Iron deficiency anemia, unspecified: Secondary | ICD-10-CM | POA: Diagnosis not present

## 2021-09-22 DIAGNOSIS — R82998 Other abnormal findings in urine: Secondary | ICD-10-CM | POA: Diagnosis not present

## 2021-09-22 DIAGNOSIS — Z79899 Other long term (current) drug therapy: Secondary | ICD-10-CM | POA: Diagnosis not present

## 2021-09-22 DIAGNOSIS — N2581 Secondary hyperparathyroidism of renal origin: Secondary | ICD-10-CM | POA: Diagnosis not present

## 2021-09-23 DIAGNOSIS — Z992 Dependence on renal dialysis: Secondary | ICD-10-CM | POA: Diagnosis not present

## 2021-09-23 DIAGNOSIS — R82998 Other abnormal findings in urine: Secondary | ICD-10-CM | POA: Diagnosis not present

## 2021-09-23 DIAGNOSIS — N186 End stage renal disease: Secondary | ICD-10-CM | POA: Diagnosis not present

## 2021-09-23 DIAGNOSIS — N2581 Secondary hyperparathyroidism of renal origin: Secondary | ICD-10-CM | POA: Diagnosis not present

## 2021-09-23 DIAGNOSIS — D509 Iron deficiency anemia, unspecified: Secondary | ICD-10-CM | POA: Diagnosis not present

## 2021-09-23 DIAGNOSIS — D631 Anemia in chronic kidney disease: Secondary | ICD-10-CM | POA: Diagnosis not present

## 2021-09-23 DIAGNOSIS — Z79899 Other long term (current) drug therapy: Secondary | ICD-10-CM | POA: Diagnosis not present

## 2021-09-23 LAB — URINE CULTURE: Culture: NO GROWTH

## 2021-09-24 DIAGNOSIS — D509 Iron deficiency anemia, unspecified: Secondary | ICD-10-CM | POA: Diagnosis not present

## 2021-09-24 DIAGNOSIS — D631 Anemia in chronic kidney disease: Secondary | ICD-10-CM | POA: Diagnosis not present

## 2021-09-24 DIAGNOSIS — Z79899 Other long term (current) drug therapy: Secondary | ICD-10-CM | POA: Diagnosis not present

## 2021-09-24 DIAGNOSIS — Z992 Dependence on renal dialysis: Secondary | ICD-10-CM | POA: Diagnosis not present

## 2021-09-24 DIAGNOSIS — R82998 Other abnormal findings in urine: Secondary | ICD-10-CM | POA: Diagnosis not present

## 2021-09-24 DIAGNOSIS — N2581 Secondary hyperparathyroidism of renal origin: Secondary | ICD-10-CM | POA: Diagnosis not present

## 2021-09-24 DIAGNOSIS — N186 End stage renal disease: Secondary | ICD-10-CM | POA: Diagnosis not present

## 2021-09-25 DIAGNOSIS — I129 Hypertensive chronic kidney disease with stage 1 through stage 4 chronic kidney disease, or unspecified chronic kidney disease: Secondary | ICD-10-CM | POA: Diagnosis not present

## 2021-09-25 DIAGNOSIS — Z79899 Other long term (current) drug therapy: Secondary | ICD-10-CM | POA: Diagnosis not present

## 2021-09-25 DIAGNOSIS — N186 End stage renal disease: Secondary | ICD-10-CM | POA: Diagnosis not present

## 2021-09-25 DIAGNOSIS — N2581 Secondary hyperparathyroidism of renal origin: Secondary | ICD-10-CM | POA: Diagnosis not present

## 2021-09-25 DIAGNOSIS — R82998 Other abnormal findings in urine: Secondary | ICD-10-CM | POA: Diagnosis not present

## 2021-09-25 DIAGNOSIS — D631 Anemia in chronic kidney disease: Secondary | ICD-10-CM | POA: Diagnosis not present

## 2021-09-25 DIAGNOSIS — Z992 Dependence on renal dialysis: Secondary | ICD-10-CM | POA: Diagnosis not present

## 2021-09-25 DIAGNOSIS — D509 Iron deficiency anemia, unspecified: Secondary | ICD-10-CM | POA: Diagnosis not present

## 2021-09-26 ENCOUNTER — Other Ambulatory Visit: Payer: Self-pay | Admitting: Nephrology

## 2021-09-26 DIAGNOSIS — Z992 Dependence on renal dialysis: Secondary | ICD-10-CM | POA: Diagnosis not present

## 2021-09-26 DIAGNOSIS — D509 Iron deficiency anemia, unspecified: Secondary | ICD-10-CM | POA: Diagnosis not present

## 2021-09-26 DIAGNOSIS — N2581 Secondary hyperparathyroidism of renal origin: Secondary | ICD-10-CM | POA: Diagnosis not present

## 2021-09-26 DIAGNOSIS — Z79899 Other long term (current) drug therapy: Secondary | ICD-10-CM | POA: Diagnosis not present

## 2021-09-26 DIAGNOSIS — N186 End stage renal disease: Secondary | ICD-10-CM | POA: Diagnosis not present

## 2021-09-26 DIAGNOSIS — R82998 Other abnormal findings in urine: Secondary | ICD-10-CM | POA: Diagnosis not present

## 2021-09-26 DIAGNOSIS — D631 Anemia in chronic kidney disease: Secondary | ICD-10-CM | POA: Diagnosis not present

## 2021-09-26 DIAGNOSIS — N2889 Other specified disorders of kidney and ureter: Secondary | ICD-10-CM

## 2021-09-27 DIAGNOSIS — N186 End stage renal disease: Secondary | ICD-10-CM | POA: Diagnosis not present

## 2021-09-27 DIAGNOSIS — N2581 Secondary hyperparathyroidism of renal origin: Secondary | ICD-10-CM | POA: Diagnosis not present

## 2021-09-27 DIAGNOSIS — R82998 Other abnormal findings in urine: Secondary | ICD-10-CM | POA: Diagnosis not present

## 2021-09-27 DIAGNOSIS — Z79899 Other long term (current) drug therapy: Secondary | ICD-10-CM | POA: Diagnosis not present

## 2021-09-27 DIAGNOSIS — D509 Iron deficiency anemia, unspecified: Secondary | ICD-10-CM | POA: Diagnosis not present

## 2021-09-27 DIAGNOSIS — D631 Anemia in chronic kidney disease: Secondary | ICD-10-CM | POA: Diagnosis not present

## 2021-09-27 DIAGNOSIS — Z992 Dependence on renal dialysis: Secondary | ICD-10-CM | POA: Diagnosis not present

## 2021-09-28 DIAGNOSIS — D509 Iron deficiency anemia, unspecified: Secondary | ICD-10-CM | POA: Diagnosis not present

## 2021-09-28 DIAGNOSIS — Z79899 Other long term (current) drug therapy: Secondary | ICD-10-CM | POA: Diagnosis not present

## 2021-09-28 DIAGNOSIS — D631 Anemia in chronic kidney disease: Secondary | ICD-10-CM | POA: Diagnosis not present

## 2021-09-28 DIAGNOSIS — Z992 Dependence on renal dialysis: Secondary | ICD-10-CM | POA: Diagnosis not present

## 2021-09-28 DIAGNOSIS — N2581 Secondary hyperparathyroidism of renal origin: Secondary | ICD-10-CM | POA: Diagnosis not present

## 2021-09-28 DIAGNOSIS — N186 End stage renal disease: Secondary | ICD-10-CM | POA: Diagnosis not present

## 2021-09-28 DIAGNOSIS — R82998 Other abnormal findings in urine: Secondary | ICD-10-CM | POA: Diagnosis not present

## 2021-09-29 DIAGNOSIS — N2581 Secondary hyperparathyroidism of renal origin: Secondary | ICD-10-CM | POA: Diagnosis not present

## 2021-09-29 DIAGNOSIS — Z992 Dependence on renal dialysis: Secondary | ICD-10-CM | POA: Diagnosis not present

## 2021-09-29 DIAGNOSIS — R82998 Other abnormal findings in urine: Secondary | ICD-10-CM | POA: Diagnosis not present

## 2021-09-29 DIAGNOSIS — D631 Anemia in chronic kidney disease: Secondary | ICD-10-CM | POA: Diagnosis not present

## 2021-09-29 DIAGNOSIS — Z79899 Other long term (current) drug therapy: Secondary | ICD-10-CM | POA: Diagnosis not present

## 2021-09-29 DIAGNOSIS — D509 Iron deficiency anemia, unspecified: Secondary | ICD-10-CM | POA: Diagnosis not present

## 2021-09-29 DIAGNOSIS — N186 End stage renal disease: Secondary | ICD-10-CM | POA: Diagnosis not present

## 2021-09-30 DIAGNOSIS — D509 Iron deficiency anemia, unspecified: Secondary | ICD-10-CM | POA: Diagnosis not present

## 2021-09-30 DIAGNOSIS — N2581 Secondary hyperparathyroidism of renal origin: Secondary | ICD-10-CM | POA: Diagnosis not present

## 2021-09-30 DIAGNOSIS — N186 End stage renal disease: Secondary | ICD-10-CM | POA: Diagnosis not present

## 2021-09-30 DIAGNOSIS — Z992 Dependence on renal dialysis: Secondary | ICD-10-CM | POA: Diagnosis not present

## 2021-09-30 DIAGNOSIS — D631 Anemia in chronic kidney disease: Secondary | ICD-10-CM | POA: Diagnosis not present

## 2021-09-30 DIAGNOSIS — Z79899 Other long term (current) drug therapy: Secondary | ICD-10-CM | POA: Diagnosis not present

## 2021-09-30 DIAGNOSIS — R82998 Other abnormal findings in urine: Secondary | ICD-10-CM | POA: Diagnosis not present

## 2021-10-01 DIAGNOSIS — Z79899 Other long term (current) drug therapy: Secondary | ICD-10-CM | POA: Diagnosis not present

## 2021-10-01 DIAGNOSIS — D509 Iron deficiency anemia, unspecified: Secondary | ICD-10-CM | POA: Diagnosis not present

## 2021-10-01 DIAGNOSIS — N186 End stage renal disease: Secondary | ICD-10-CM | POA: Diagnosis not present

## 2021-10-01 DIAGNOSIS — D631 Anemia in chronic kidney disease: Secondary | ICD-10-CM | POA: Diagnosis not present

## 2021-10-01 DIAGNOSIS — R82998 Other abnormal findings in urine: Secondary | ICD-10-CM | POA: Diagnosis not present

## 2021-10-01 DIAGNOSIS — N2581 Secondary hyperparathyroidism of renal origin: Secondary | ICD-10-CM | POA: Diagnosis not present

## 2021-10-01 DIAGNOSIS — Z992 Dependence on renal dialysis: Secondary | ICD-10-CM | POA: Diagnosis not present

## 2021-10-02 DIAGNOSIS — N2581 Secondary hyperparathyroidism of renal origin: Secondary | ICD-10-CM | POA: Diagnosis not present

## 2021-10-02 DIAGNOSIS — D509 Iron deficiency anemia, unspecified: Secondary | ICD-10-CM | POA: Diagnosis not present

## 2021-10-02 DIAGNOSIS — R82998 Other abnormal findings in urine: Secondary | ICD-10-CM | POA: Diagnosis not present

## 2021-10-02 DIAGNOSIS — D631 Anemia in chronic kidney disease: Secondary | ICD-10-CM | POA: Diagnosis not present

## 2021-10-02 DIAGNOSIS — Z79899 Other long term (current) drug therapy: Secondary | ICD-10-CM | POA: Diagnosis not present

## 2021-10-02 DIAGNOSIS — Z992 Dependence on renal dialysis: Secondary | ICD-10-CM | POA: Diagnosis not present

## 2021-10-02 DIAGNOSIS — N186 End stage renal disease: Secondary | ICD-10-CM | POA: Diagnosis not present

## 2021-10-03 ENCOUNTER — Other Ambulatory Visit: Payer: Self-pay | Admitting: Internal Medicine

## 2021-10-03 ENCOUNTER — Other Ambulatory Visit: Payer: Self-pay | Admitting: Family Medicine

## 2021-10-03 DIAGNOSIS — N2581 Secondary hyperparathyroidism of renal origin: Secondary | ICD-10-CM | POA: Diagnosis not present

## 2021-10-03 DIAGNOSIS — R82998 Other abnormal findings in urine: Secondary | ICD-10-CM | POA: Diagnosis not present

## 2021-10-03 DIAGNOSIS — N186 End stage renal disease: Secondary | ICD-10-CM | POA: Diagnosis not present

## 2021-10-03 DIAGNOSIS — D631 Anemia in chronic kidney disease: Secondary | ICD-10-CM | POA: Diagnosis not present

## 2021-10-03 DIAGNOSIS — D509 Iron deficiency anemia, unspecified: Secondary | ICD-10-CM | POA: Diagnosis not present

## 2021-10-03 DIAGNOSIS — Z992 Dependence on renal dialysis: Secondary | ICD-10-CM | POA: Diagnosis not present

## 2021-10-03 DIAGNOSIS — Z79899 Other long term (current) drug therapy: Secondary | ICD-10-CM | POA: Diagnosis not present

## 2021-10-03 DIAGNOSIS — M109 Gout, unspecified: Secondary | ICD-10-CM

## 2021-10-04 DIAGNOSIS — D509 Iron deficiency anemia, unspecified: Secondary | ICD-10-CM | POA: Diagnosis not present

## 2021-10-04 DIAGNOSIS — D631 Anemia in chronic kidney disease: Secondary | ICD-10-CM | POA: Diagnosis not present

## 2021-10-04 DIAGNOSIS — Z79899 Other long term (current) drug therapy: Secondary | ICD-10-CM | POA: Diagnosis not present

## 2021-10-04 DIAGNOSIS — R82998 Other abnormal findings in urine: Secondary | ICD-10-CM | POA: Diagnosis not present

## 2021-10-04 DIAGNOSIS — N186 End stage renal disease: Secondary | ICD-10-CM | POA: Diagnosis not present

## 2021-10-04 DIAGNOSIS — N2581 Secondary hyperparathyroidism of renal origin: Secondary | ICD-10-CM | POA: Diagnosis not present

## 2021-10-04 DIAGNOSIS — Z992 Dependence on renal dialysis: Secondary | ICD-10-CM | POA: Diagnosis not present

## 2021-10-05 DIAGNOSIS — Z992 Dependence on renal dialysis: Secondary | ICD-10-CM | POA: Diagnosis not present

## 2021-10-05 DIAGNOSIS — Z79899 Other long term (current) drug therapy: Secondary | ICD-10-CM | POA: Diagnosis not present

## 2021-10-05 DIAGNOSIS — R82998 Other abnormal findings in urine: Secondary | ICD-10-CM | POA: Diagnosis not present

## 2021-10-05 DIAGNOSIS — D631 Anemia in chronic kidney disease: Secondary | ICD-10-CM | POA: Diagnosis not present

## 2021-10-05 DIAGNOSIS — N186 End stage renal disease: Secondary | ICD-10-CM | POA: Diagnosis not present

## 2021-10-05 DIAGNOSIS — D509 Iron deficiency anemia, unspecified: Secondary | ICD-10-CM | POA: Diagnosis not present

## 2021-10-05 DIAGNOSIS — N2581 Secondary hyperparathyroidism of renal origin: Secondary | ICD-10-CM | POA: Diagnosis not present

## 2021-10-06 DIAGNOSIS — Z992 Dependence on renal dialysis: Secondary | ICD-10-CM | POA: Diagnosis not present

## 2021-10-06 DIAGNOSIS — D509 Iron deficiency anemia, unspecified: Secondary | ICD-10-CM | POA: Diagnosis not present

## 2021-10-06 DIAGNOSIS — D631 Anemia in chronic kidney disease: Secondary | ICD-10-CM | POA: Diagnosis not present

## 2021-10-06 DIAGNOSIS — Z79899 Other long term (current) drug therapy: Secondary | ICD-10-CM | POA: Diagnosis not present

## 2021-10-06 DIAGNOSIS — R82998 Other abnormal findings in urine: Secondary | ICD-10-CM | POA: Diagnosis not present

## 2021-10-06 DIAGNOSIS — N186 End stage renal disease: Secondary | ICD-10-CM | POA: Diagnosis not present

## 2021-10-06 DIAGNOSIS — N2581 Secondary hyperparathyroidism of renal origin: Secondary | ICD-10-CM | POA: Diagnosis not present

## 2021-10-07 DIAGNOSIS — N2581 Secondary hyperparathyroidism of renal origin: Secondary | ICD-10-CM | POA: Diagnosis not present

## 2021-10-07 DIAGNOSIS — D509 Iron deficiency anemia, unspecified: Secondary | ICD-10-CM | POA: Diagnosis not present

## 2021-10-07 DIAGNOSIS — R82998 Other abnormal findings in urine: Secondary | ICD-10-CM | POA: Diagnosis not present

## 2021-10-07 DIAGNOSIS — D631 Anemia in chronic kidney disease: Secondary | ICD-10-CM | POA: Diagnosis not present

## 2021-10-07 DIAGNOSIS — Z79899 Other long term (current) drug therapy: Secondary | ICD-10-CM | POA: Diagnosis not present

## 2021-10-07 DIAGNOSIS — Z992 Dependence on renal dialysis: Secondary | ICD-10-CM | POA: Diagnosis not present

## 2021-10-07 DIAGNOSIS — N186 End stage renal disease: Secondary | ICD-10-CM | POA: Diagnosis not present

## 2021-10-08 ENCOUNTER — Emergency Department (HOSPITAL_COMMUNITY)
Admission: EM | Admit: 2021-10-08 | Discharge: 2021-10-08 | Disposition: A | Discharge status: Home / Self Care | Payer: Medicare Other | Attending: Emergency Medicine | Admitting: Emergency Medicine

## 2021-10-08 ENCOUNTER — Encounter (HOSPITAL_COMMUNITY): Payer: Self-pay

## 2021-10-08 ENCOUNTER — Emergency Department (HOSPITAL_COMMUNITY): Payer: Medicare Other

## 2021-10-08 ENCOUNTER — Other Ambulatory Visit: Payer: Self-pay

## 2021-10-08 DIAGNOSIS — Z7982 Long term (current) use of aspirin: Secondary | ICD-10-CM | POA: Insufficient documentation

## 2021-10-08 DIAGNOSIS — R031 Nonspecific low blood-pressure reading: Secondary | ICD-10-CM | POA: Diagnosis not present

## 2021-10-08 DIAGNOSIS — N2581 Secondary hyperparathyroidism of renal origin: Secondary | ICD-10-CM | POA: Diagnosis not present

## 2021-10-08 DIAGNOSIS — Z79899 Other long term (current) drug therapy: Secondary | ICD-10-CM | POA: Diagnosis not present

## 2021-10-08 DIAGNOSIS — E039 Hypothyroidism, unspecified: Secondary | ICD-10-CM | POA: Insufficient documentation

## 2021-10-08 DIAGNOSIS — R1030 Lower abdominal pain, unspecified: Secondary | ICD-10-CM | POA: Diagnosis present

## 2021-10-08 DIAGNOSIS — R109 Unspecified abdominal pain: Secondary | ICD-10-CM | POA: Diagnosis not present

## 2021-10-08 DIAGNOSIS — Z21 Asymptomatic human immunodeficiency virus [HIV] infection status: Secondary | ICD-10-CM | POA: Insufficient documentation

## 2021-10-08 DIAGNOSIS — I132 Hypertensive heart and chronic kidney disease with heart failure and with stage 5 chronic kidney disease, or end stage renal disease: Secondary | ICD-10-CM | POA: Insufficient documentation

## 2021-10-08 DIAGNOSIS — Z85528 Personal history of other malignant neoplasm of kidney: Secondary | ICD-10-CM | POA: Diagnosis not present

## 2021-10-08 DIAGNOSIS — R9431 Abnormal electrocardiogram [ECG] [EKG]: Secondary | ICD-10-CM | POA: Insufficient documentation

## 2021-10-08 DIAGNOSIS — Z992 Dependence on renal dialysis: Secondary | ICD-10-CM | POA: Insufficient documentation

## 2021-10-08 DIAGNOSIS — N200 Calculus of kidney: Secondary | ICD-10-CM | POA: Insufficient documentation

## 2021-10-08 DIAGNOSIS — J4520 Mild intermittent asthma, uncomplicated: Secondary | ICD-10-CM | POA: Insufficient documentation

## 2021-10-08 DIAGNOSIS — R82998 Other abnormal findings in urine: Secondary | ICD-10-CM | POA: Diagnosis not present

## 2021-10-08 DIAGNOSIS — D509 Iron deficiency anemia, unspecified: Secondary | ICD-10-CM | POA: Diagnosis not present

## 2021-10-08 DIAGNOSIS — N186 End stage renal disease: Secondary | ICD-10-CM | POA: Diagnosis not present

## 2021-10-08 DIAGNOSIS — I5032 Chronic diastolic (congestive) heart failure: Secondary | ICD-10-CM | POA: Insufficient documentation

## 2021-10-08 DIAGNOSIS — R531 Weakness: Secondary | ICD-10-CM | POA: Diagnosis not present

## 2021-10-08 DIAGNOSIS — Z87891 Personal history of nicotine dependence: Secondary | ICD-10-CM | POA: Diagnosis not present

## 2021-10-08 DIAGNOSIS — D631 Anemia in chronic kidney disease: Secondary | ICD-10-CM | POA: Diagnosis not present

## 2021-10-08 LAB — COMPREHENSIVE METABOLIC PANEL
ALT: 12 U/L (ref 0–44)
AST: 14 U/L — ABNORMAL LOW (ref 15–41)
Albumin: 3.8 g/dL (ref 3.5–5.0)
Alkaline Phosphatase: 75 U/L (ref 38–126)
Anion gap: 20 — ABNORMAL HIGH (ref 5–15)
BUN: 64 mg/dL — ABNORMAL HIGH (ref 6–20)
CO2: 21 mmol/L — ABNORMAL LOW (ref 22–32)
Calcium: 9.7 mg/dL (ref 8.9–10.3)
Chloride: 90 mmol/L — ABNORMAL LOW (ref 98–111)
Creatinine, Ser: 19.49 mg/dL — ABNORMAL HIGH (ref 0.61–1.24)
GFR, Estimated: 3 mL/min — ABNORMAL LOW (ref >60)
Glucose, Bld: 102 mg/dL — ABNORMAL HIGH (ref 70–99)
Potassium: 3.6 mmol/L (ref 3.5–5.1)
Sodium: 131 mmol/L — ABNORMAL LOW (ref 135–145)
Total Bilirubin: 0.6 mg/dL (ref 0.3–1.2)
Total Protein: 7.5 g/dL (ref 6.5–8.1)

## 2021-10-08 LAB — URINALYSIS, MICROSCOPIC (REFLEX)

## 2021-10-08 LAB — CBC WITH DIFFERENTIAL/PLATELET
Abs Immature Granulocytes: 0.02 10*3/uL (ref 0.00–0.07)
Basophils Absolute: 0 10*3/uL (ref 0.0–0.1)
Basophils Relative: 0 %
Eosinophils Absolute: 0.1 10*3/uL (ref 0.0–0.5)
Eosinophils Relative: 2 %
HCT: 26 % — ABNORMAL LOW (ref 39.0–52.0)
Hemoglobin: 9.1 g/dL — ABNORMAL LOW (ref 13.0–17.0)
Immature Granulocytes: 0 %
Lymphocytes Relative: 28 %
Lymphs Abs: 1.9 10*3/uL (ref 0.7–4.0)
MCH: 34.5 pg — ABNORMAL HIGH (ref 26.0–34.0)
MCHC: 35 g/dL (ref 30.0–36.0)
MCV: 98.5 fL (ref 80.0–100.0)
Monocytes Absolute: 0.9 10*3/uL (ref 0.1–1.0)
Monocytes Relative: 13 %
Neutro Abs: 3.8 10*3/uL (ref 1.7–7.7)
Neutrophils Relative %: 57 %
Platelets: 238 10*3/uL (ref 150–400)
RBC: 2.64 MIL/uL — ABNORMAL LOW (ref 4.22–5.81)
RDW: 17.2 % — ABNORMAL HIGH (ref 11.5–15.5)
WBC: 6.7 10*3/uL (ref 4.0–10.5)
nRBC: 0 % (ref 0.0–0.2)

## 2021-10-08 LAB — URINALYSIS, ROUTINE W REFLEX MICROSCOPIC
Bilirubin Urine: NEGATIVE
Glucose, UA: NEGATIVE mg/dL
Ketones, ur: NEGATIVE mg/dL
Nitrite: NEGATIVE
Protein, ur: >300 mg/dL — AB
Specific Gravity, Urine: 1.025 (ref 1.005–1.030)
pH: 5.5 (ref 5.0–8.0)

## 2021-10-08 IMAGING — CT CT RENAL STONE PROTOCOL
2 of 4 series · 16 of 46 positions shown, 18 images · non-contrast
Comparison: Multiple priors including most recent CT [DATE]

CLINICAL DATA: Pelvic pain concern for bladder infection. On
peritoneal dialysis, missed last night due to weakness and low blood
pressure

EXAM:
CT ABDOMEN AND PELVIS WITHOUT CONTRAST
TECHNIQUE: Multidetector CT imaging of the abdomen and pelvis was performed
following the standard protocol without IV contrast.

[Series 3: ap without · axial · non-contrast · 0.85mm/px · z∈[+839,+1209]mm · 13 of 84 slices shown, 15 images]
[im 5/84  soft-tissue]
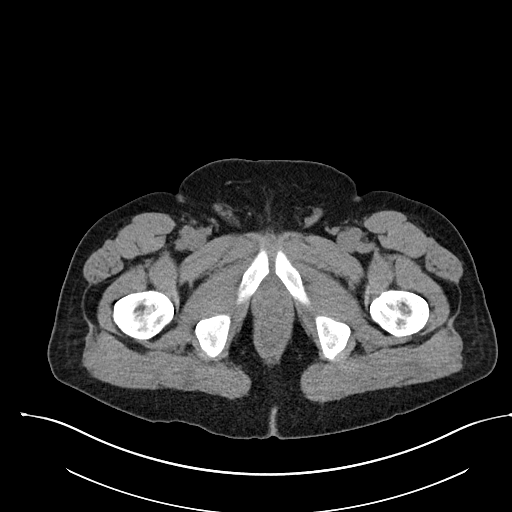
[im 5/84  bone]
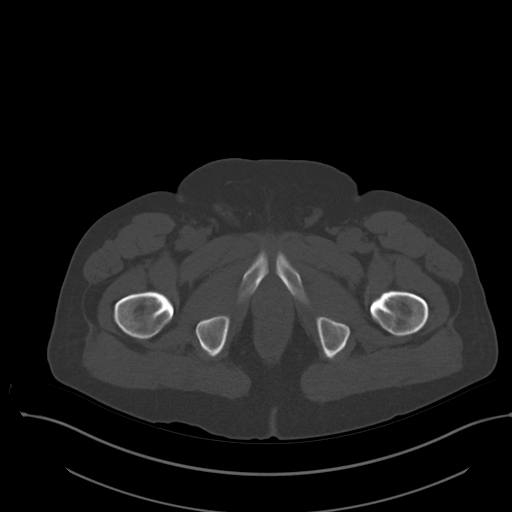
[im 14/84  soft-tissue]
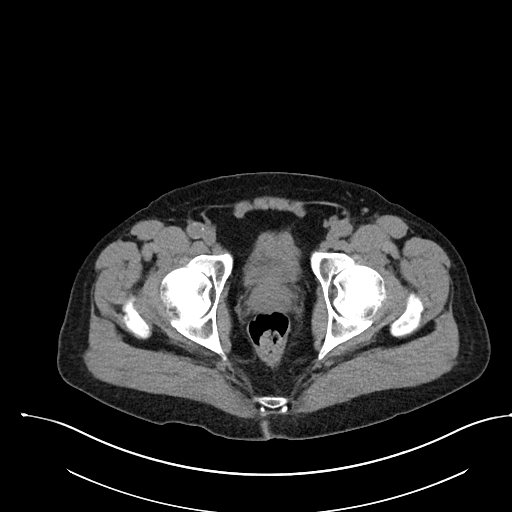
[im 18/84  soft-tissue]
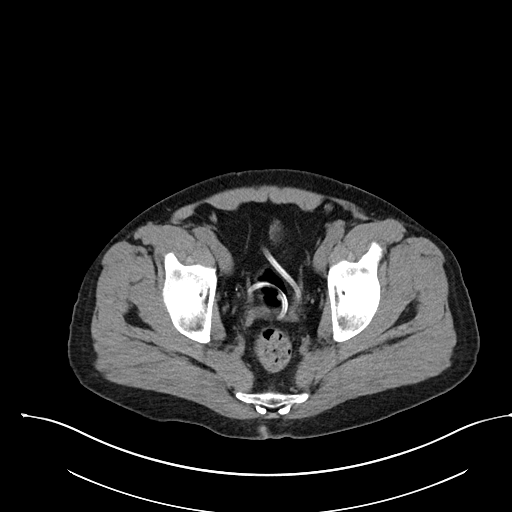
[im 22/84  soft-tissue]
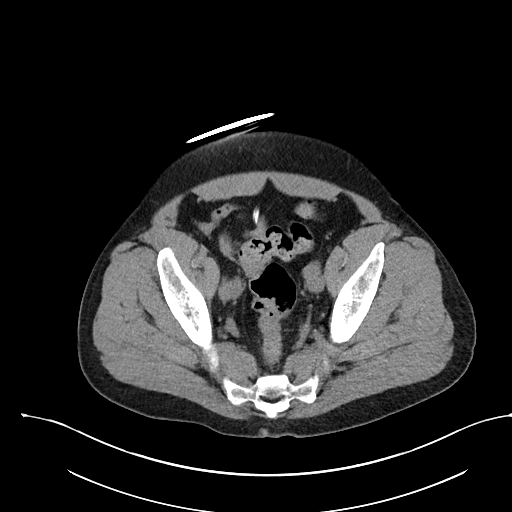
[im 31/84  soft-tissue]
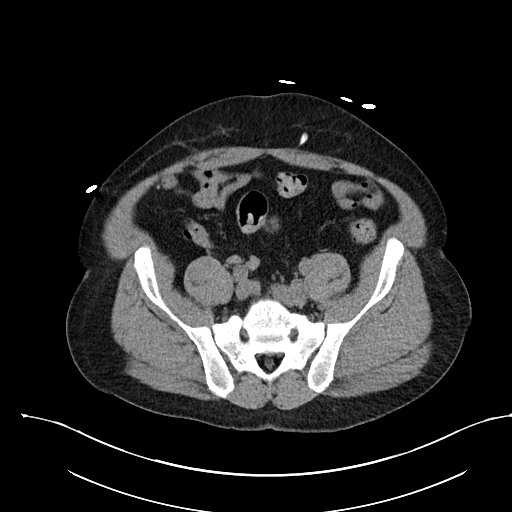
[im 35/84  soft-tissue]
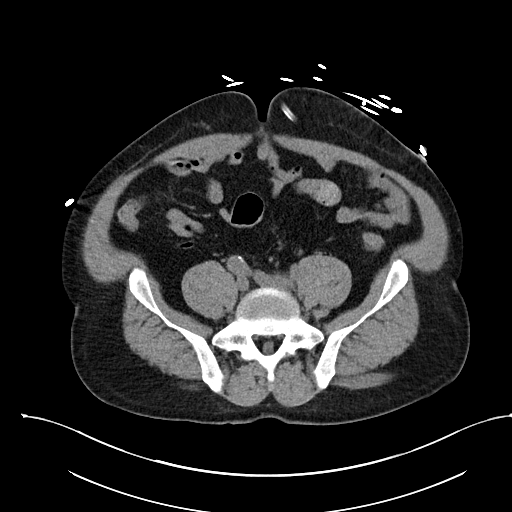
[im 44/84  soft-tissue]
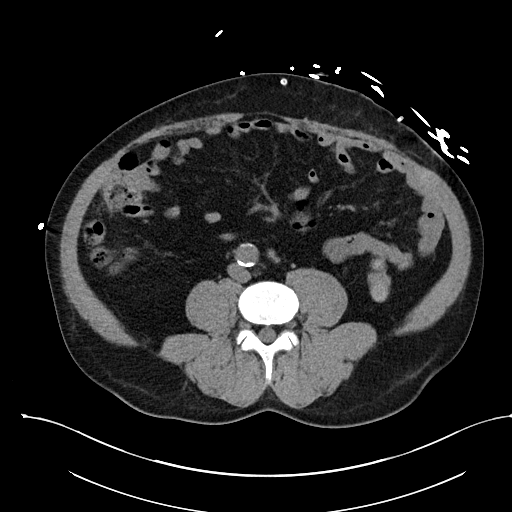
[im 49/84  soft-tissue]
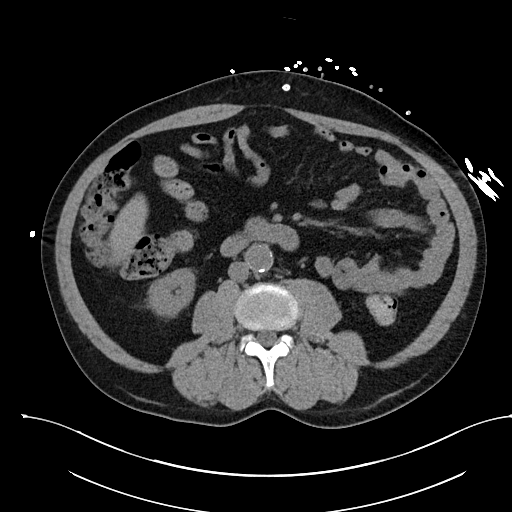
[im 53/84  soft-tissue]
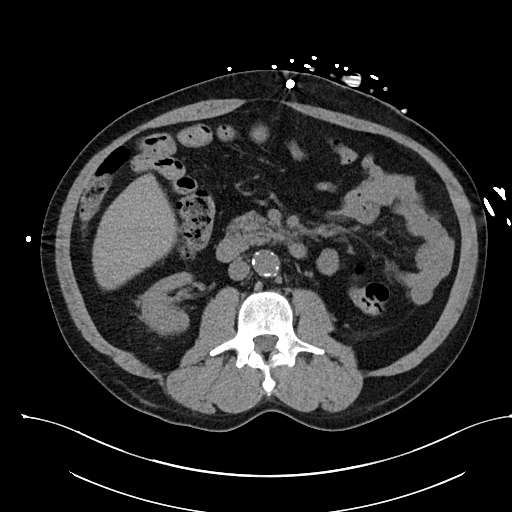
[im 53/84  bone]
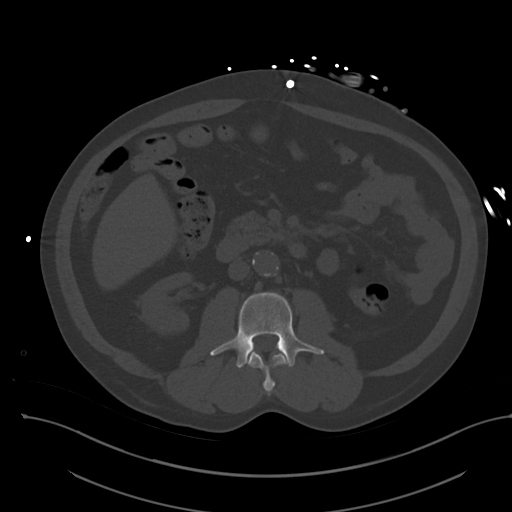
[im 62/84  soft-tissue]
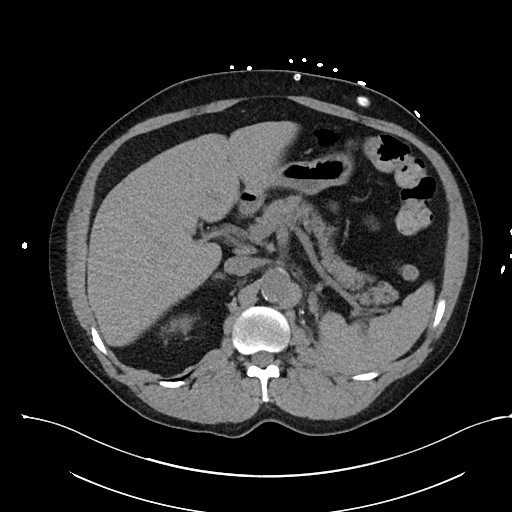
[im 66/84  soft-tissue]
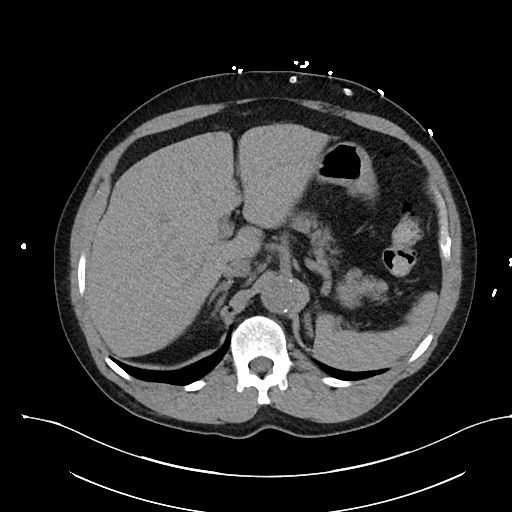
[im 70/84  soft-tissue]
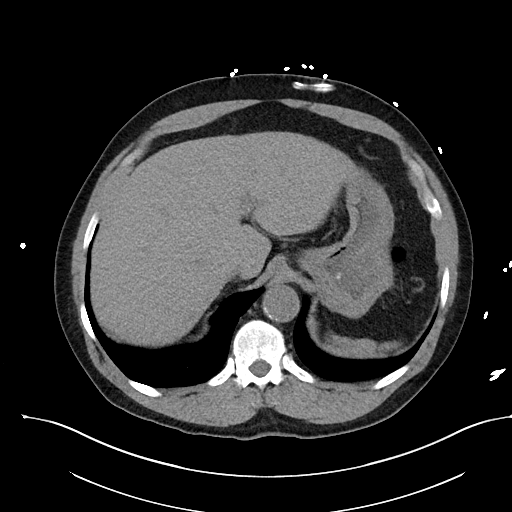
[im 79/84  soft-tissue]
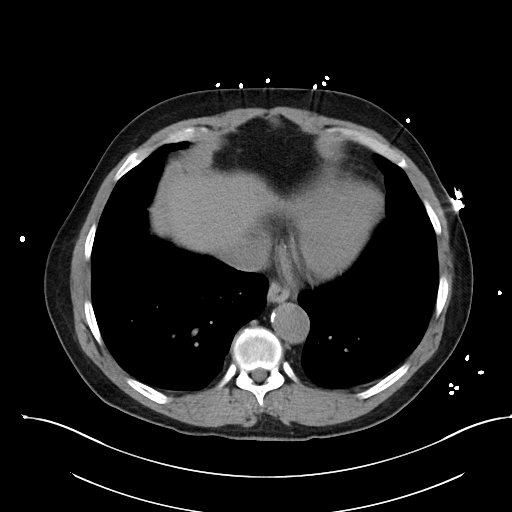

[Series 6: cor · coronal · 0.82mm/px · 3 of 109 slices shown]
[im 37/109  soft-tissue]
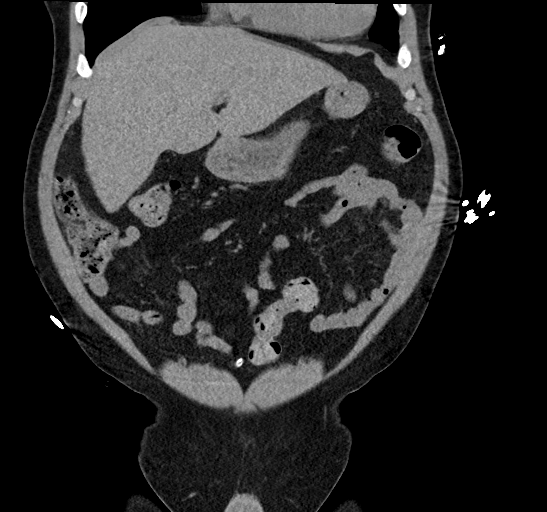
[im 49/109  soft-tissue]
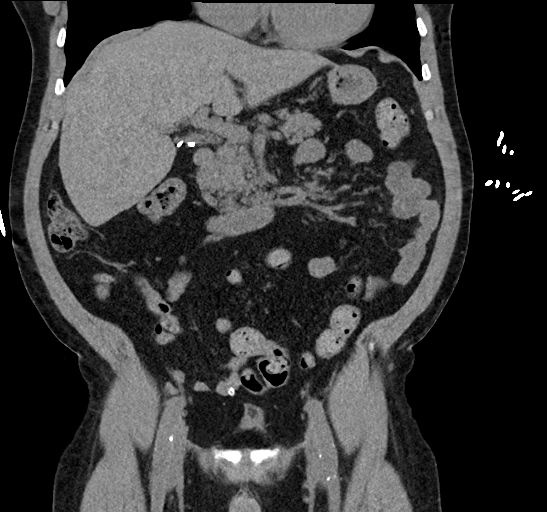
[im 61/109  soft-tissue]
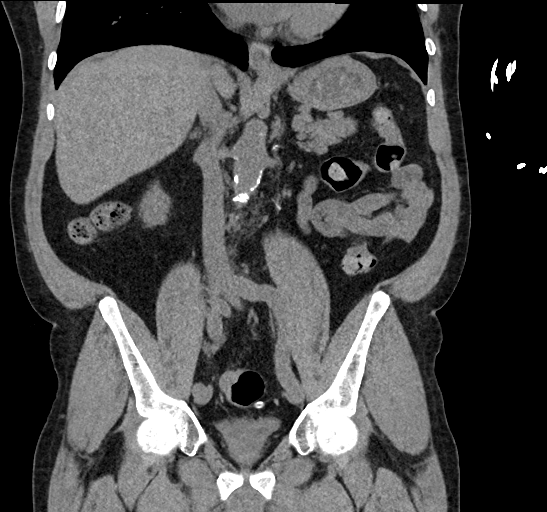

[16 of 46 positions shown; findings below may reference images not displayed]

FINDINGS: Lower chest: No acute abnormality.

Hepatobiliary: Unremarkable noncontrast appearance of the hepatic
parenchyma. Gallbladder surgically absent. No biliary ductal
dilation.

Pancreas: No pancreatic ductal dilation or evidence of acute
inflammation.

Spleen: Within normal limits.

Adrenals/Urinary Tract: Bilateral adrenal glands are unremarkable.
Prior left nephrectomy. 18 mm right upper pole renal cyst. No renal,
ureteral or bladder calculi. Urinary bladder is decompressed.

Stomach/Bowel: No enteric contrast was administered. Stomach is
unremarkable for degree of distension. No pathologic dilation of
small or large bowel. The appendix and terminal ileum appear normal.
No evidence of acute bowel inflammation.

Vascular/Lymphatic: Aortic and branch vessel atherosclerosis without
abdominal aortic aneurysm. No pathologically enlarged abdominopelvic
lymph nodes

Reproductive: Prostate is unremarkable.

Other: Trace pelvic ascites, likely peritoneal dialysate. Left
anterior abdominal wall subcutaneous peritoneal dialysis catheter
with ILDEBERTO tip coiled in pelvis.

Musculoskeletal: No acute or significant osseous findings.
IMPRESSION: 1. No acute intra-abdominal or intrapelvic findings.
2. Tenckhoff type peritoneal dialysis catheter in the low abdomen.
Trace ascites in the pelvis, likely peritoneal dialysate.
3.  Aortic Atherosclerosis ([0J]-[0J]).

## 2021-10-08 NOTE — ED Provider Notes (Signed)
Emergency Medicine Provider Triage Evaluation Note  Albert Eaton , a 55 y.o. male  was evaluated in triage.  Pt complains of dysuria and nausea. PD dialysis patient.  Was not able to do his PD dialysis yesterday evening due to weakness and low blood pressure.  States he has had intermittent low blood pressures at home has appointment follow-up PCP on Wednesday for this.  No chest pain, shortness of breath.  Just feels generally weak.  Review of Systems  Positive: Dysuria, nausea Negative: fever  Physical Exam  BP 111/72   Pulse 89   Temp 98.5 F (36.9 C) (Oral)   Ht 5' 10"  (1.778 m)   Wt 93.4 kg   SpO2 98%   BMI 29.56 kg/m  Gen:   Awake, no distress   Resp:  Normal effort  MSK:   Moves extremities without difficulty  Other:    Medical Decision Making  Medically screening exam initiated at 5:49 AM.  Appropriate orders placed.  Albert Eaton was informed that the remainder of the evaluation will be completed by another provider, this initial triage assessment does not replace that evaluation, and the importance of remaining in the ED until their evaluation is complete.  Dysuria, nausea   Koven Belinsky A, PA-C 96/43/83 [id number]    Delora Fuel, MD 03/75/43 [phone]

## 2021-10-08 NOTE — ED Provider Notes (Signed)
[facility] EMERGENCY DEPARTMENT Provider Note   CSN: [id number] Arrival date & time: 10/08/21  0534     History Chief Complaint  Patient presents with   Cystitis    Albert Eaton is a 55 y.o. male.  HPI 55 year old male with end-stage renal disease, on home peritoneal dialysis presenting with complaint of pain in his suprapubic region.  He believes that he has a bladder infection.  He did not dialyze last night because he was weak and his blood pressure was "low."  He has had low blood pressure since yesterday morning and at 1 point his monitor documented heart rate of 178.  He has had periods of high heart rate in the past when he had similar symptoms.  He states he has been treated several times for UTI.  He has a single remaining kidney on the right side after having his left kidney removed for "cancer."  He states that currently he has pain in his lower abdomen only, and that the pain is 7/10.  He declined pain medicines when I talked to him.  He has not noticed dysuria or hematuria.  He denies fever, chills, weakness or dizziness.  He has been eating well.  There are no other known active modifying factors.    Past Medical History:  Diagnosis Date   Anemia    Aortic atherosclerosis (HCC)    Arthritis    Asthma    Cancer ([place])    renal cyst   Chronic diastolic CHF (congestive heart failure) ([place]) 01/06/2017   Echo 7/16 [facility] in [place], [name]) Mild AI, mild LAE, mild concentric LVH, EF 55, normal wall motion, mild to moderate MR, mild PI, RVSP 55 // Echo 10/09/16 ([facility]):  Moderate LVH, grade 2 diastolic dysfunction, mild MR, moderate LAE    CKD (chronic kidney disease)    Dialysis Mon Wed Fri   Esophagitis    GERD (gastroesophageal reflux disease)    Gout    no current problems   Heart murmur    never caused any problems   Hepatitis    Hep B   History of nuclear stress test    a. Nuc study 7/16: no scar or ischemia, EF 42 // b. Nuc study  12/17: EF 48, ?small apical ischemia, Low Risk   HIV (human immunodeficiency virus infection) ([place])    HLD (hyperlipidemia)    Hypertension    Hypertensive heart disease with CHF (congestive heart failure) ([place]) 10/09/2016   Hypothyroidism    Mitral regurgitation    OSA (obstructive sleep apnea)    Post-operative nausea and vomiting    1 time as a child, no problems as an adult   Sleep apnea    uses c-pap   Thyroid disease    Wears glasses    Wears partial dentures     Patient Active Problem List   Diagnosis Date Noted   Chest pain of uncertain etiology    Elevated troponin    NSTEMI (non-ST elevated myocardial infarction) ([place]) 09/03/2021   Essential hypertension 09/03/2021   Renal cell carcinoma ([place]) 06/27/2021   SVT (supraventricular tachycardia) ([place]) 01/18/2021   Lower abdominal pain 06/26/2020   Pulsatile tinnitus of right ear 11/21/2019   Kidney failure 20/35/5974   Acute diastolic CHF (congestive heart failure) ([place]) 11/21/2019   Chills (without fever) 11/07/2019   Hypercalcemia 10/17/2019   Dependence on renal dialysis ([place]) 06/06/2019   Coagulation defect, unspecified ([place]) 05/23/2019   Iron deficiency anemia, unspecified 05/16/2019  Anemia in other chronic diseases classified elsewhere 05/13/2019   Arteriovenous fistula, acquired ([place]) 05/13/2019   Body mass index (BMI) 28.0-28.9, adult 05/13/2019   Crohn's disease of small intestine without complications ([place]) 45/85/9292   Encounter for screening for respiratory tuberculosis 05/13/2019   Gout, unspecified 05/13/2019   Nausea 05/13/2019   Other fatigue 05/13/2019   Secondary hyperparathyroidism of renal origin ([place]) 05/13/2019   End stage renal disease ([place]) 05/13/2019   Chronic diastolic (congestive) heart failure ([place]) 05/13/2019   Chronic kidney disease, unspecified 05/13/2019   Obstructive sleep apnea 05/13/2019   Discomfort of right ear 02/18/2019   Hypokalemia 06/13/2018   Abnormal CT scan, small  bowel    Perennial allergic rhinitis with a predominant nonallergic component 02/09/2018   Allergic conjunctivitis 02/09/2018   Mild intermittent asthma 02/09/2018   Atherosclerosis 12/09/2017   Hypothyroidism 11/19/2017   Mixed hyperlipidemia 10/13/2017   Personal history of gout 10/13/2017   S/P cholecystectomy 10/13/2017   Congestive heart failure with LV diastolic dysfunction, NYHA class 1 ([place]) 10/13/2017   Hypothyroidism (acquired) 10/13/2017   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   SOB (shortness of breath) 06/07/2017   Arthritis, gouty 01/27/2017   Chronic diastolic CHF (congestive heart failure) ([place]) 01/06/2017   OSA (obstructive sleep apnea) 01/06/2017   Abnormal electrocardiogram (ECG) (EKG) 10/31/2016   Chest pain 10/09/2016   HIV disease ([place]) 10/09/2016   Hypertensive heart disease with CHF (congestive heart failure) ([place]) 10/09/2016   Left-sided low back pain without sciatica 05/29/2015   NYHA class 2 heart failure with preserved ejection fraction ([place]) 05/15/2015   PAH (pulmonary artery hypertension) ([place]) 05/08/2015   Severe mitral regurgitation 05/04/2015   Prolonged Q-T interval on ECG 05/04/2015    Past Surgical History:  Procedure Laterality Date   AV FISTULA PLACEMENT Left 07/02/2018   Procedure: Creation of Left arm BRACHIOBASILIC ARTERIOVENOUS  FISTULA;  Surgeon: Marty Heck, MD;  Location: [place];  Service: Vascular;  Laterality: Left;   [place] Left 08/27/2018   Procedure: Left arm BRACHIOBASILIC VEIN TRANSPOSITION SECOND STAGE;  Surgeon: Marty Heck, MD;  Location: [facility];  Service: Vascular;  Laterality: Left;   BUBBLE STUDY  09/03/2020   Procedure: BUBBLE STUDY;  Surgeon: Fay Records, MD;  Location: [place];  Service: Cardiovascular;;   CAPD INSERTION N/A 05/30/2021   Procedure: LAPAROSCOPIC INSERTION CONTINUOUS AMBULATORY PERITONEAL DIALYSIS  (CAPD) CATHETER;  Surgeon: [name], MD;  Location: [facility] OR;   Service: Vascular;  Laterality: N/A;   CHOLECYSTECTOMY     COLONOSCOPY W/ BIOPSIES AND POLYPECTOMY     GIVENS CAPSULE STUDY N/A 03/01/2018   Procedure: GIVENS CAPSULE STUDY;  Surgeon: Jerene Bears, MD;  Location: [place];  Service: Gastroenterology;  Laterality: N/A;   HERNIA REPAIR     As baby   MULTIPLE TOOTH EXTRACTIONS     NEPHRECTOMY Left 02/18/2021   NOSE SURGERY     RENAL BIOPSY     RIGHT/LEFT HEART CATH AND CORONARY ANGIOGRAPHY N/A 09/04/2021   Procedure: RIGHT/LEFT HEART CATH AND CORONARY ANGIOGRAPHY;  Surgeon: Burnell Blanks, MD;  Location: [place] [facility];  Service: Cardiovascular;  Laterality: N/A;   TEE WITHOUT CARDIOVERSION N/A 09/03/2020   Procedure: TRANSESOPHAGEAL ECHOCARDIOGRAM (TEE);  Surgeon: Fay Records, MD;  Location: [facility] ENDOSCOPY;  Service: Cardiovascular;  Laterality: N/A;   UPPER GI ENDOSCOPY  04/05/2021       Family History  Problem Relation Age of Onset   Heart failure Mother  Heart attack Maternal Grandmother 53   Hypertension Sister    Multiple sclerosis Sister    Hypertension Brother    Hypertension Sister    Scoliosis Other    Allergic rhinitis Neg Hx    Angioedema Neg Hx    Asthma Neg Hx    Eczema Neg Hx    Immunodeficiency Neg Hx    Urticaria Neg Hx    Colon cancer Neg Hx    Pancreatic cancer Neg Hx    Esophageal cancer Neg Hx     Social History   Tobacco Use   Smoking status: Former    Packs/day: 1.00    Types: Cigarettes    Quit date: 2000    Years since quitting: 22.9   Smokeless tobacco: Never  Vaping Use   Vaping Use: Never used  Substance Use Topics   Alcohol use: Yes    Comment: occasionally liquor   Drug use: No    Home Medications Prior to Admission medications   Medication Sig Start Date End Date Taking? Authorizing Provider  ACETAMINOPHEN EXTRA STRENGTH 500 MG tablet Take 500 mg by mouth every 6 (six) hours as needed (pain.). 02/20/21  Yes [provider]  albuterol (PROAIR HFA) 108  (90 Base) MCG/ACT inhaler INHALE 2 PUFFS INTO THE LUNGS EVERY 4 HOURS AS NEEDED FOR WHEEZING OR SHORTNESS OF BREATH 08/07/21  Yes Valentina Shaggy, MD  allopurinol (ZYLOPRIM) 100 MG tablet TAKE 1 TABLET(100 MG) BY MOUTH TWICE DAILY FOR 60 DOSES 10/03/21  Yes [name], FNP  amLODipine (NORVASC) 10 MG tablet Take 1 tablet (10 mg total) by mouth daily. Patient taking differently: Take 10 mg by mouth daily at 12 noon. At midday 10/31/16  Yes [name], FNP  aspirin EC 81 MG tablet Take 1 tablet (81 mg total) by mouth daily. Swallow whole. Patient taking differently: Take 81 mg by mouth in the morning. Swallow whole. 03/06/21  Yes Weaver, Scott T, PA-C  azelastine (ASTELIN) 0.1 % nasal spray PLACE 2 SPRAYS INTO EACH NOSTRIL TWICE DAILY AS NEEDED FOR RHINITIS AS DIRECTED Patient taking differently: Place 2 sprays into both nostrils 2 (two) times daily as needed for allergies or rhinitis. 06/05/21  Yes Valentina Shaggy, MD  BIKTARVY 50-200-25 MG TABS tablet TAKE 1 TABLET BY MOUTH DAILY 07/08/21  Yes Campbell Riches, MD  calcitRIOL (ROCALTROL) 0.5 MCG capsule Take 0.5 mcg by mouth every Monday, Wednesday, and Friday. 02/06/21  Yes [provider]  carvedilol (COREG) 25 MG tablet TAKE 1 TABLET BY MOUTH TWICE DAILY WITH FOOD Patient taking differently: Take 25 mg by mouth 2 (two) times daily with a meal. 12/31/20  Yes [name], MD  chlorproMAZINE (THORAZINE) 10 MG tablet Take 1 tablet (10 mg total) by mouth 3 (three) times daily as needed for hiccoughs. Patient taking differently: Take 10 mg by mouth 3 (three) times daily. 02/26/21  Yes [name], FNP  clonazePAM (KLONOPIN) 0.5 MG tablet Take 1 tablet (0.5 mg total) by mouth at bedtime. 01/02/21  Yes Olalere, Adewale A, MD  clotrimazole-betamethasone (LOTRISONE) cream Apply topically 2 (two) times daily. X 3-5 days. IF symptoms continue, call office Patient taking differently: Apply 1 application topically 2 (two) times  daily as needed (rectal discomfort). 05/16/21  Yes Pyrtle, Lajuan Lines, MD  docusate sodium (COLACE) 100 MG capsule Take 100 mg by mouth 2 (two) times daily. 02/20/21  Yes [provider]  fluticasone (FLOVENT HFA) 110 MCG/ACT inhaler INHALE 2 PUFFS INTO THE LUNGS  TWICE DAILY Patient taking differently: Inhale 2 puffs into the lungs 2 (two) times daily as needed (sob/wheezing). 06/24/21  Yes Valentina Shaggy, MD  gabapentin (NEURONTIN) 300 MG capsule TAKE 1 CAPSULE(300 MG) BY MOUTH THREE TIMES DAILY Patient taking differently: Take 300 mg by mouth 3 (three) times daily. 10/09/20  Yes [name], FNP  gentamicin cream (GARAMYCIN) 0.1 % Apply 1 application topically See admin instructions. Apply when catheter is flushed and cleaned 05/28/21  Yes [provider]  hydrALAZINE (APRESOLINE) 100 MG tablet Take 1 tablet (100 mg total) by mouth 3 (three) times daily. 04/02/21 04/02/22 Yes [name], MD  hyoscyamine (LEVSIN SL) 0.125 MG SL tablet Place 1-2 tablets (0.125-0.25 mg total) under the tongue every 8 (eight) hours as needed (lower abdominal pain, spasm). 08/05/19  Yes Pyrtle, Lajuan Lines, MD  ipratropium (ATROVENT) 0.06 % nasal spray USE 2 SPRAYS IN EACH NOSTRIL 2 TO 3 TIMES DAILY AS NEEDED Patient taking differently: Place 2 sprays into both nostrils 3 (three) times daily as needed (allergies). 09/03/20  Yes Valentina Shaggy, MD  iron sucrose (VENOFER) 20 MG/ML injection Inject into the vein. 07/12/20  Yes [provider]  isosorbide mononitrate (IMDUR) 30 MG 24 hr tablet Take 1 tablet (30 mg total) by mouth daily. 09/05/21  Yes Patrecia Pour, MD  lactulose (CHRONULAC) 10 GM/15ML solution Take 20 g by mouth daily as needed for moderate constipation.  07/08/20  Yes [provider]  levothyroxine (SYNTHROID) 25 MCG tablet TAKE 1 TABLET BY MOUTH DAILY BEFORE BREAKFAST Patient taking differently: Take 25 mcg by mouth daily before breakfast. 02/05/21  Yes [name], FNP  lidocaine-prilocaine (EMLA) cream Apply 1 application topically See admin instructions. Apply to access site 1-2 hours before dialysis 06/01/19  Yes [provider]  lubiprostone (AMITIZA) 24 MCG capsule TAKE 1 CAPSULE(24 MCG) BY MOUTH TWICE DAILY WITH A MEAL Patient taking differently: 24 mcg 2 (two) times daily as needed for constipation. 10/03/21  Yes Pyrtle, Lajuan Lines, MD  methocarbamol (ROBAXIN) 500 MG tablet Take 1 tablet (500 mg total) by mouth every 8 (eight) hours as needed for muscle spasms. Patient taking differently: Take 500 mg by mouth at bedtime. 02/11/21  Yes Maudie Flakes, MD  Methoxy PEG-Epoetin Beta (MIRCERA IJ) Inject into the skin. 11/08/20  Yes [provider]  multivitamin (RENA-VIT) TABS tablet Take 1 tablet by mouth at bedtime. 01/10/21  Yes [provider]  mupirocin ointment (BACTROBAN) 2 % Apply 1 application topically daily as needed (apply to port after dialysis).  04/24/20  Yes [provider]  Olopatadine HCl (PAZEO) 0.7 % SOLN Place 1 drop into both eyes daily. Patient taking differently: Place 1 drop into both eyes daily as needed (itchy eyes). 05/24/20  Yes Valentina Shaggy, MD  pantoprazole (PROTONIX) 40 MG tablet TAKE 1 TABLET(40 MG) BY MOUTH DAILY 08/07/21  Yes Esterwood, Amy S, PA-C  rOPINIRole (REQUIP) 0.5 MG tablet Take 0.5 mg by mouth at bedtime. 02/05/21  Yes [provider]  rosuvastatin (CRESTOR) 10 MG tablet Take 10 mg by mouth daily.   Yes [provider]  senna (SENOKOT) 8.6 MG TABS tablet Take 2 tablets by mouth every evening. 02/20/21  Yes [provider]  SENSIPAR 30 MG tablet 30 mg every evening. 09/17/21  Yes [provider]  sevelamer carbonate (RENVELA) 800 MG tablet Take 1,600 mg by mouth 3 (three) times daily with meals. 11/09/20  Yes [provider]  Vitamin D,  Ergocalciferol, (DRISDOL) 1.25 MG (50000 UNIT) CAPS capsule TAKE 1 CAPSULE BY MOUTH EVERY 7  DAYS Patient taking differently: Take 50,000 Units by mouth every Tuesday. At midday 02/05/21  Yes [name], FNP  clobetasol (OLUX) 0.05 % topical foam Apply topically 2 (two) times daily. Patient not taking: Reported on 10/08/2021 08/21/21   Lavonna Monarch, MD  predniSONE (DELTASONE) 10 MG tablet Take 1 tablet (10 mg total) by mouth daily with breakfast. Patient not taking: Reported on 10/08/2021 08/06/21   Suzan Slick, NP  rOPINIRole (REQUIP) 1 MG tablet Take 1 tablet (1 mg total) by mouth at bedtime. Patient not taking: Reported on 10/08/2021 08/12/21   Laurin Coder, MD  rosuvastatin (CRESTOR) 10 MG tablet Take 1 tablet (10 mg total) by mouth daily. Patient taking differently: Take 10 mg by mouth in the morning. 03/06/21 06/17/21  Richardson Dopp T, PA-C  Spacer/Aero-Holding Chambers DEVI 1 Device by Does not apply route 2 (two) times a day. 03/01/19   Bobbitt, Sedalia Muta, MD    Allergies    Ace inhibitors and Lisinopril  Review of Systems   Review of Systems  All other systems reviewed and are negative.  Physical Exam Updated Vital Signs BP (!) 139/100   Pulse 84   Temp 98.3 F (36.8 C) (Oral)   Resp 19   Ht 5' 10"  (1.778 m)   Wt 94.8 kg   SpO2 97%   BMI 29.99 kg/m   Physical Exam Vitals and nursing note reviewed.  Constitutional:      Appearance: He is well-developed. He is not ill-appearing.  HENT:     Head: Normocephalic and atraumatic.     Right Ear: External ear normal.     Left Ear: External ear normal.  Eyes:     Conjunctiva/sclera: Conjunctivae normal.     Pupils: Pupils are equal, round, and reactive to light.  Neck:     Trachea: Phonation normal.  Cardiovascular:     Rate and Rhythm: Normal rate and regular rhythm.     Heart sounds: Normal heart sounds.  Pulmonary:     Effort: Pulmonary effort is normal.     Breath sounds: Normal breath sounds.  Abdominal:     General: There is no distension.     Palpations: Abdomen is soft. There is no  mass.     Tenderness: There is no abdominal tenderness.     Comments: Normal-appearing peritoneal dialysis catheter in mid lower abdomen.  Musculoskeletal:        General: Normal range of motion.     Cervical back: Normal range of motion and neck supple.  Skin:    General: Skin is warm and dry.  Neurological:     Mental Status: He is alert and oriented to person, place, and time.     Cranial Nerves: No cranial nerve deficit.     Sensory: No sensory deficit.     Motor: No abnormal muscle tone.     Coordination: Coordination normal.  Psychiatric:        Mood and Affect: Mood normal.        Behavior: Behavior normal.        Thought Content: Thought content normal.        Judgment: Judgment normal.    ED Results / Procedures / Treatments   Labs (all labs ordered are listed, but only abnormal results are displayed) Labs Reviewed  CBC WITH DIFFERENTIAL/PLATELET - Abnormal; Notable for the following components:  Result Value   RBC 2.64 (*)    Hemoglobin 9.1 (*)    HCT 26.0 (*)    MCH 34.5 (*)    RDW 17.2 (*)    All other components within normal limits  COMPREHENSIVE METABOLIC PANEL - Abnormal; Notable for the following components:   Sodium 131 (*)    Chloride 90 (*)    CO2 21 (*)    Glucose, Bld 102 (*)    BUN 64 (*)    Creatinine, Ser 19.49 (*)    AST 14 (*)    GFR, Estimated 3 (*)    Anion gap 20 (*)    All other components within normal limits  URINALYSIS, ROUTINE W REFLEX MICROSCOPIC - Abnormal; Notable for the following components:   APPearance HAZY (*)    Hgb urine dipstick TRACE (*)    Protein, ur >300 (*)    Leukocytes,Ua TRACE (*)    All other components within normal limits  URINALYSIS, MICROSCOPIC (REFLEX) - Abnormal; Notable for the following components:   Bacteria, UA RARE (*)    All other components within normal limits    EKG EKG Interpretation  Date/Time:  Tuesday October 08 2021 09:30:03 EST Ventricular Rate:  77 PR Interval:  167 QRS  Duration: 97 QT Interval:  413 QTC Calculation: 468 R Axis:   62 Text Interpretation: Sinus rhythm Nonspecific repol abnormality, inferior leads Since last tracing inferior T wave abnormality Otherwise no significant change Confirmed by Daleen Bo [id number]) on 10/08/2021 10:31:03 AM  Radiology CT Renal Stone Study  Result Date: 10/08/2021 CLINICAL DATA:  Pelvic pain concern for bladder infection. On peritoneal dialysis, missed last night due to weakness and low blood pressure EXAM: CT ABDOMEN AND PELVIS WITHOUT CONTRAST TECHNIQUE: Multidetector CT imaging of the abdomen and pelvis was performed following the standard protocol without IV contrast. COMPARISON:  Multiple priors including most recent CT September 21, 2021 FINDINGS: Lower chest: No acute abnormality. Hepatobiliary: Unremarkable noncontrast appearance of the hepatic parenchyma. Gallbladder surgically absent. No biliary ductal dilation. Pancreas: No pancreatic ductal dilation or evidence of acute inflammation. Spleen: Within normal limits. Adrenals/Urinary Tract: Bilateral adrenal glands are unremarkable. Prior left nephrectomy. 18 mm right upper pole renal cyst. No renal, ureteral or bladder calculi. Urinary bladder is decompressed. Stomach/Bowel: No enteric contrast was administered. Stomach is unremarkable for degree of distension. No pathologic dilation of small or large bowel. The appendix and terminal ileum appear normal. No evidence of acute bowel inflammation. Vascular/Lymphatic: Aortic and branch vessel atherosclerosis without abdominal aortic aneurysm. No pathologically enlarged abdominopelvic lymph nodes Reproductive: Prostate is unremarkable. Other: Trace pelvic ascites, likely peritoneal dialysate. Left anterior abdominal wall subcutaneous peritoneal dialysis catheter with Davis tip coiled in pelvis. Musculoskeletal: No acute or significant osseous findings. IMPRESSION: 1. No acute intra-abdominal or intrapelvic findings. 2.  Tenckhoff type peritoneal dialysis catheter in the low abdomen. Trace ascites in the pelvis, likely peritoneal dialysate. 3.  Aortic Atherosclerosis (ICD10-I70.0). Electronically Signed   By: Dahlia Bailiff M.D.   On: 10/08/2021 10:01    Procedures Procedures   Medications Ordered in ED Medications - No data to display  ED Course  I have reviewed the triage vital signs and the nursing notes.  Pertinent labs & imaging results that were available during my care of the patient were reviewed by me and considered in my medical decision making (see chart for details).  Clinical Course as of 10/08/21 1409  Tue Oct 08, 2021  0847 Low BP since yesterday, 98/57 with HR  178 at 1:30 PM yesterday. [EW]    Clinical Course User Index [EW] Daleen Bo, MD   MDM Rules/Calculators/A&P                            Patient Vitals for the past 24 hrs:  BP Temp Temp src Pulse Resp SpO2 Height Weight  10/08/21 1230 (!) 139/100 -- -- 84 19 97 % -- --  10/08/21 1200 (!) 141/96 -- -- 93 19 99 % -- --  10/08/21 1154 (!) 135/92 -- -- (!) 101 20 98 % -- --  10/08/21 0915 128/88 -- -- 88 16 99 % -- --  10/08/21 0900 (!) 138/100 -- -- 100 -- 100 % -- --  10/08/21 0804 -- -- -- 83 -- 99 % -- --  10/08/21 0803 (!) 145/97 98.3 F (36.8 C) Oral 77 17 100 % 5' 10"  (1.778 m) 94.8 kg  10/08/21 0802 (!) 141/99 -- -- 82 16 100 % -- --  10/08/21 0545 -- -- -- -- -- -- 5' 10"  (1.778 m) 93.4 kg  10/08/21 0543 111/72 98.5 F (36.9 C) Oral 89 -- 98 % -- --    2:08 PM Reevaluation with update and discussion. After initial assessment and treatment, an updated evaluation reveals he is comfortable this time has no further complaints. Daleen Bo   Medical Decision Making:  This patient is presenting for evaluation of lower abdominal pain, which does require a range of treatment options, and is a complaint that involves a moderate risk of morbidity and mortality. The differential diagnoses include UTI,  nephrolithiasis, complications from peritoneal dialysis. I decided to review old records, and in summary middle-aged male with history of renal cancer, on peritoneal dialysis, presenting with persistent abdominal pain.  Diagnosed with UTI in 09/21/2021, urine culture at that time was negative.  I did not require additional historical information from anyone.  Clinical Laboratory Tests Ordered, included CBC, Metabolic panel, and Urinalysis. Review indicates normal except sodium low, chloride low, CO2 low, glucose high, BUN high, creatinine high, AST low, hemoglobin low, urine with nonspecific findings. Radiologic Tests Ordered, included CT renal abdomen pelvis.  I independently Visualized: Radiographic images, which show no acute abnormalities  Cardiac Monitor Tracing which shows normal sinus rhythm    Critical Interventions-clinical evaluation, laboratory testing, radiography, observation and reassessment  After These Interventions, the Patient was reevaluated and was found stable, without ongoing needs for intervention.  Patient previously had a right ureteral stone which appears to have passed.  He likely has pain secondary to passage of the stone but no acute ongoing urologic processes.  He can be managed as an outpatient.  His complaints of lower abdominal pain are possibly related to the presence of the peritoneal catheter which appears to be near to his urinary bladder.  CRITICAL CARE-no Performed by: Daleen Bo  Nursing Notes Reviewed/ Care Coordinated Applicable Imaging Reviewed Interpretation of Laboratory Data incorporated into ED treatment  The patient appears reasonably screened and/or stabilized for discharge and I doubt any other medical condition or other [facility] requiring further screening, evaluation, or treatment in the ED at this time prior to discharge.  Patient left AMA prior to receiving discharge instructions-2:20 PM     Final Clinical Impression(s) / ED  Diagnoses Final diagnoses:  Lower abdominal pain  Nephrolithiasis  Abnormal EKG    Rx / DC Orders ED Discharge Orders     None        Eulis Foster,  Vira Agar, MD 10/08/21 1430

## 2021-10-08 NOTE — ED Triage Notes (Addendum)
Pt presents to the ED from home with complaints of pain in bladder area. Pt thinks he has a bladder infection. Denies urinary pain or difficulty. Pt on peritoneal dialysis, did not do dialysis last night due to being weak and having low BP.

## 2021-10-08 NOTE — ED Notes (Signed)
Pt states he wants to leave left AMA.  Provider notified

## 2021-10-09 DIAGNOSIS — D509 Iron deficiency anemia, unspecified: Secondary | ICD-10-CM | POA: Diagnosis not present

## 2021-10-09 DIAGNOSIS — D631 Anemia in chronic kidney disease: Secondary | ICD-10-CM | POA: Diagnosis not present

## 2021-10-09 DIAGNOSIS — N186 End stage renal disease: Secondary | ICD-10-CM | POA: Diagnosis not present

## 2021-10-09 DIAGNOSIS — N2581 Secondary hyperparathyroidism of renal origin: Secondary | ICD-10-CM | POA: Diagnosis not present

## 2021-10-09 DIAGNOSIS — R82998 Other abnormal findings in urine: Secondary | ICD-10-CM | POA: Diagnosis not present

## 2021-10-09 DIAGNOSIS — Z992 Dependence on renal dialysis: Secondary | ICD-10-CM | POA: Diagnosis not present

## 2021-10-09 DIAGNOSIS — Z79899 Other long term (current) drug therapy: Secondary | ICD-10-CM | POA: Diagnosis not present

## 2021-10-09 NOTE — Progress Notes (Signed)
Cardiology Office Note   Date:  10/10/2021   ID:  Albert Eaton, DOB [birth year], MRN [id number]  PCP:  [name], FNP  Cardiologist:  Dr. [name] EP  Dr. Quentin Ore    Chief Complaint  Patient presents with   Follow-up   Weakness   Near Syncope           History of Present Illness: Albert Eaton is a 55 y.o. male who presents post hospital for CAD.   He has a hx of .   D-CHF, ESRD on PD, nl nuc 2017, HIV (undetectable), HTN, hypothyroid, hep B (treated), OSA not on CPAP, admitted 09/03/21 with chest pain, came at rest.  Associated jwith SOB.  He did PD but pain increased so he drained and came to ER.   Minimal ST changes   placed on IV heparin and NTG.  He had cardiac cath with non obs diseae 50% in RCA and mLAD, and normal rt and Lt heart pressures.   Pk troponin 109 with non obstructive disease plan for BB, high intensity statin,  continue PD, undergoing renal transplant work up.    He had atrial tach, transient now on BB has seen EP but plan for conservative therapy unless increased symptoms.  With his HTN imdur added to meds.  Since then has been found to have a kidney stone and UTI.  Placed on Keflex now completed.  Was seen by renal and they adjusted dialysis to put in and remove less fluid   Today was exhausted coming into room.  Had to lie down immediately and felt really bad.  His wt is down 11 lbs since discharge from the hospital.  He stopped his hydralazine, amlodipine, coreg, a week ago and still has episodes where his BP drops.   He also has rapid HR at times, most likely his atrial tach now off BB.    I had pt lie back fro a few min and BP did come up from 90/60 to 120/90.  Some chest tightness with rapid HR.         Past Medical History:  Diagnosis Date   Anemia    Aortic atherosclerosis (HCC)    Arthritis    Asthma    Cancer ([place])    renal cyst   Chronic diastolic CHF (congestive heart failure) ([place]) 01/06/2017   Echo 7/16 [facility] in  [place], [name]) Mild AI, mild LAE, mild concentric LVH, EF 55, normal wall motion, mild to moderate MR, mild PI, RVSP 55 // Echo 10/09/16 ([facility]):  Moderate LVH, grade 2 diastolic dysfunction, mild MR, moderate LAE    CKD (chronic kidney disease)    Dialysis Mon Wed Fri   Esophagitis    GERD (gastroesophageal reflux disease)    Gout    no current problems   Heart murmur    never caused any problems   Hepatitis    Hep B   History of nuclear stress test    a. Nuc study 7/16: no scar or ischemia, EF 42 // b. Nuc study 12/17: EF 48, ?small apical ischemia, Low Risk   HIV (human immunodeficiency virus infection) ([place])    HLD (hyperlipidemia)    Hypertension    Hypertensive heart disease with CHF (congestive heart failure) ([place]) 10/09/2016   Hypothyroidism    Mitral regurgitation    OSA (obstructive sleep apnea)    Post-operative nausea and vomiting    1 time as a child, no problems as an adult  Sleep apnea    uses c-pap   Thyroid disease    Wears glasses    Wears partial dentures     Past Surgical History:  Procedure Laterality Date   AV FISTULA PLACEMENT Left 07/02/2018   Procedure: Creation of Left arm BRACHIOBASILIC ARTERIOVENOUS  FISTULA;  Surgeon: Marty Heck, MD;  Location: [place];  Service: Vascular;  Laterality: Left;   [place] Left 08/27/2018   Procedure: Left arm BRACHIOBASILIC VEIN TRANSPOSITION SECOND STAGE;  Surgeon: Marty Heck, MD;  Location: [place];  Service: Vascular;  Laterality: Left;   BUBBLE STUDY  09/03/2020   Procedure: BUBBLE STUDY;  Surgeon: Fay Records, MD;  Location: [place];  Service: Cardiovascular;;   CAPD INSERTION N/A 05/30/2021   Procedure: LAPAROSCOPIC INSERTION CONTINUOUS AMBULATORY PERITONEAL DIALYSIS  (CAPD) CATHETER;  Surgeon: [name], MD;  Location: [place];  Service: Vascular;  Laterality: N/A;   CHOLECYSTECTOMY     COLONOSCOPY W/ BIOPSIES AND POLYPECTOMY     GIVENS CAPSULE STUDY N/A 03/01/2018    Procedure: GIVENS CAPSULE STUDY;  Surgeon: Jerene Bears, MD;  Location: [place];  Service: Gastroenterology;  Laterality: N/A;   HERNIA REPAIR     As baby   MULTIPLE TOOTH EXTRACTIONS     NEPHRECTOMY Left 02/18/2021   NOSE SURGERY     RENAL BIOPSY     RIGHT/LEFT HEART CATH AND CORONARY ANGIOGRAPHY N/A 09/04/2021   Procedure: RIGHT/LEFT HEART CATH AND CORONARY ANGIOGRAPHY;  Surgeon: Burnell Blanks, MD;  Location: [facility];  Service: Cardiovascular;  Laterality: N/A;   TEE WITHOUT CARDIOVERSION N/A 09/03/2020   Procedure: TRANSESOPHAGEAL ECHOCARDIOGRAM (TEE);  Surgeon: Fay Records, MD;  Location: [facility] ENDOSCOPY;  Service: Cardiovascular;  Laterality: N/A;   UPPER GI ENDOSCOPY  04/05/2021     Current Outpatient Medications  Medication Sig Dispense Refill   ACETAMINOPHEN EXTRA STRENGTH 500 MG tablet Take 500 mg by mouth every 6 (six) hours as needed (pain.).     albuterol (PROAIR HFA) 108 (90 Base) MCG/ACT inhaler INHALE 2 PUFFS INTO THE LUNGS EVERY 4 HOURS AS NEEDED FOR WHEEZING OR SHORTNESS OF BREATH 17 g 1   allopurinol (ZYLOPRIM) 100 MG tablet TAKE 1 TABLET(100 MG) BY MOUTH TWICE DAILY FOR 60 DOSES 60 tablet 0   amLODipine (NORVASC) 10 MG tablet Take 1 tablet (10 mg total) by mouth daily. (Patient taking differently: Take 10 mg by mouth daily at 12 noon. At midday) 30 tablet 1   aspirin EC 81 MG tablet Take 1 tablet (81 mg total) by mouth daily. Swallow whole. (Patient taking differently: Take 81 mg by mouth in the morning. Swallow whole.) 90 tablet 3   azelastine (ASTELIN) 0.1 % nasal spray PLACE 2 SPRAYS INTO EACH NOSTRIL TWICE DAILY AS NEEDED FOR RHINITIS AS DIRECTED (Patient taking differently: Place 2 sprays into both nostrils 2 (two) times daily as needed for allergies or rhinitis.) 30 mL 5   BIKTARVY 50-200-25 MG TABS tablet TAKE 1 TABLET BY MOUTH DAILY 30 tablet 5   calcitRIOL (ROCALTROL) 0.5 MCG capsule Take 0.5 mcg by mouth every Monday, Wednesday, and  Friday.     chlorproMAZINE (THORAZINE) 10 MG tablet Take 1 tablet (10 mg total) by mouth 3 (three) times daily as needed for hiccoughs. (Patient taking differently: Take 10 mg by mouth 3 (three) times daily.) 60 tablet 0   clobetasol (OLUX) 0.05 % topical foam Apply topically 2 (two) times daily. 50 g 2   clonazePAM (KLONOPIN)  0.5 MG tablet Take 1 tablet (0.5 mg total) by mouth at bedtime. 30 tablet 2   clotrimazole-betamethasone (LOTRISONE) cream Apply topically 2 (two) times daily. X 3-5 days. IF symptoms continue, call office (Patient taking differently: Apply 1 application topically 2 (two) times daily as needed (rectal discomfort).) 30 g 0   docusate sodium (COLACE) 100 MG capsule Take 100 mg by mouth 2 (two) times daily.     fluticasone (FLOVENT HFA) 110 MCG/ACT inhaler INHALE 2 PUFFS INTO THE LUNGS TWICE DAILY (Patient taking differently: Inhale 2 puffs into the lungs 2 (two) times daily as needed (sob/wheezing).) 12 g 4   gabapentin (NEURONTIN) 300 MG capsule TAKE 1 CAPSULE(300 MG) BY MOUTH THREE TIMES DAILY (Patient taking differently: Take 300 mg by mouth 3 (three) times daily.) 90 capsule 3   gentamicin cream (GARAMYCIN) 0.1 % Apply 1 application topically See admin instructions. Apply when catheter is flushed and cleaned     hydrALAZINE (APRESOLINE) 100 MG tablet Take 1 tablet (100 mg total) by mouth 3 (three) times daily. 270 tablet 3   hyoscyamine (LEVSIN SL) 0.125 MG SL tablet Place 1-2 tablets (0.125-0.25 mg total) under the tongue every 8 (eight) hours as needed (lower abdominal pain, spasm). 90 tablet 2   ipratropium (ATROVENT) 0.06 % nasal spray USE 2 SPRAYS IN EACH NOSTRIL 2 TO 3 TIMES DAILY AS NEEDED (Patient taking differently: Place 2 sprays into both nostrils 3 (three) times daily as needed (allergies).) 15 mL 1   iron sucrose (VENOFER) 20 MG/ML injection Inject into the vein.     lactulose (CHRONULAC) 10 GM/15ML solution Take 20 g by mouth daily as needed for moderate  constipation.      levothyroxine (SYNTHROID) 25 MCG tablet TAKE 1 TABLET BY MOUTH DAILY BEFORE BREAKFAST (Patient taking differently: Take 25 mcg by mouth daily before breakfast.) 90 tablet 3   lidocaine-prilocaine (EMLA) cream Apply 1 application topically See admin instructions. Apply to access site 1-2 hours before dialysis     lubiprostone (AMITIZA) 24 MCG capsule TAKE 1 CAPSULE(24 MCG) BY MOUTH TWICE DAILY WITH A MEAL (Patient taking differently: 24 mcg 2 (two) times daily as needed for constipation.) 60 capsule 4   methocarbamol (ROBAXIN) 500 MG tablet Take 1 tablet (500 mg total) by mouth every 8 (eight) hours as needed for muscle spasms. (Patient taking differently: Take 500 mg by mouth at bedtime.) 30 tablet 0   Methoxy PEG-Epoetin Beta (MIRCERA IJ) Inject into the skin.     metoprolol tartrate (LOPRESSOR) 25 MG tablet Take 0.5 tablets (12.5 mg total) by mouth 2 (two) times daily. 90 tablet 3   multivitamin (RENA-VIT) TABS tablet Take 1 tablet by mouth at bedtime.     mupirocin ointment (BACTROBAN) 2 % Apply 1 application topically daily as needed (apply to port after dialysis).      Olopatadine HCl (PAZEO) 0.7 % SOLN Place 1 drop into both eyes daily. (Patient taking differently: Place 1 drop into both eyes daily as needed (itchy eyes).) 2.5 mL 5   pantoprazole (PROTONIX) 40 MG tablet TAKE 1 TABLET(40 MG) BY MOUTH DAILY 30 tablet 6   rOPINIRole (REQUIP) 1 MG tablet Take 1 tablet (1 mg total) by mouth at bedtime. 30 tablet 3   rosuvastatin (CRESTOR) 10 MG tablet Take 10 mg by mouth daily.     senna (SENOKOT) 8.6 MG TABS tablet Take 2 tablets by mouth every evening.     SENSIPAR 30 MG tablet 30 mg every evening.  sevelamer carbonate (RENVELA) 800 MG tablet Take 1,600 mg by mouth 3 (three) times daily with meals.     Spacer/Aero-Holding Chambers DEVI 1 Device by Does not apply route 2 (two) times a day. 1 each 1   Vitamin D, Ergocalciferol, (DRISDOL) 1.25 MG (50000 UNIT) CAPS capsule TAKE  1 CAPSULE BY MOUTH EVERY 7 DAYS (Patient taking differently: Take 50,000 Units by mouth every Tuesday. At midday) 12 capsule 1   No current facility-administered medications for this visit.    Allergies:   Ace inhibitors and Lisinopril    Social History:  The patient  reports that he quit smoking about 22 years ago. His smoking use included cigarettes. He smoked an average of 1 pack per day. He has never used smokeless tobacco. He reports current alcohol use. He reports that he does not use drugs.   Family History:  The patient's family history includes Heart attack (age of onset: 39) in his maternal grandmother; Heart failure in his mother; Hypertension in his brother, sister, and sister; Multiple sclerosis in his sister; Scoliosis in an other family member.    ROS:  General:no colds or fevers, no weight changes Skin:no rashes or ulcers HEENT:no blurred vision, no congestion CV:see HPI PUL:see HPI GI:no diarrhea constipation or melena, no indigestion GU:no hematuria, no dysuria MS:no joint pain, no claudication Neuro:no syncope, no lightheadedness Endo:no diabetes, no thyroid disease  Wt Readings from Last 3 Encounters:  10/10/21 201 lb (91.2 kg)  10/08/21 209 lb (94.8 kg)  09/10/21 212 lb 3.2 oz (96.3 kg)     PHYSICAL EXAM: VS:  BP 90/60    Pulse 66    Ht 5' 10"  (1.778 m)    Wt 201 lb (91.2 kg)    SpO2 99%    BMI 28.84 kg/m  , BMI Body mass index is 28.84 kg/m. General:Pleasant affect, NAD Skin:Warm and dry, brisk capillary refill HEENT:normocephalic, sclera clear, mucus membranes moist Neck:supple, no JVD, no bruits  Heart:S1S2 RRR without murmur, gallup, rub or click Lungs:clear without rales, rhonchi, or wheezes KCL:EXNT, non tender, + BS, do not palpate liver spleen or masses Ext:no lower ext edema, 2+ pedal pulses, 2+ radial pulses Neuro:alert and oriented, MAE, follows commands, + facial symmetry  BP lying 139/80  sitting 129/70 and standing pt became weak and we  had him lie back down and BP 700 systolic.     EKG:  EKG is ordered today. The ekg ordered today demonstrates ST at 100 no ST changes   Recent Labs: 09/03/2021: TSH 1.870 10/08/2021: ALT 12; BUN 64; Creatinine, Ser 19.49; Hemoglobin 9.1; Platelets 238; Potassium 3.6; Sodium 131    Lipid Panel    Component Value Date/Time   CHOL 126 09/04/2021 0347   CHOL 126 05/01/2021 1055   TRIG 320 (H) 09/04/2021 0347   HDL 34 (L) 09/04/2021 0347   HDL 40 05/01/2021 1055   CHOLHDL 3.7 09/04/2021 0347   VLDL 64 (H) 09/04/2021 0347   LDLCALC 28 09/04/2021 0347   LDLCALC 83 06/27/2021 0424       Other studies Reviewed: Additional studies/ records that were reviewed today include: .  Echo 03/04/21   1. Left ventricular ejection fraction, by estimation, is 60 to 65%. The  left ventricle has normal function. The left ventricle has no regional  wall motion abnormalities. There is mild left ventricular hypertrophy.  Left ventricular diastolic parameters  were normal.   2. Right ventricular systolic function is normal. The right ventricular  size is normal. There  is mildly elevated pulmonary artery systolic  pressure.   3. Left atrial size was severely dilated.   4. Right atrial size was mildly dilated.   5. The mitral valve is normal in structure. Moderate to severe mitral  valve regurgitation. No evidence of mitral stenosis.   6. The aortic valve is normal in structure. Aortic valve regurgitation is  not visualized. No aortic stenosis is present.   7. There is borderline dilatation of the ascending aorta, measuring 39  mm.   8. The inferior vena cava is normal in size with greater than 50%  respiratory variability, suggesting right atrial pressure of 3 mmHg.   FINDINGS   Left Ventricle: Left ventricular ejection fraction, by estimation, is 60  to 65%. The left ventricle has normal function. The left ventricle has no  regional wall motion abnormalities. The left ventricular internal  cavity  size was normal in size. There is   mild left ventricular hypertrophy. Left ventricular diastolic parameters  were normal.   Right Ventricle: The right ventricular size is normal. No increase in  right ventricular wall thickness. Right ventricular systolic function is  normal. There is mildly elevated pulmonary artery systolic pressure. The  tricuspid regurgitant velocity is 3.01   m/s, and with an assumed right atrial pressure of 8 mmHg, the estimated  right ventricular systolic pressure is 90.3 mmHg.   Left Atrium: Left atrial size was severely dilated.   Right Atrium: Right atrial size was mildly dilated.   Pericardium: There is no evidence of pericardial effusion.   Mitral Valve: The mitral valve is normal in structure. Moderate to severe  mitral valve regurgitation. No evidence of mitral valve stenosis.   Tricuspid Valve: The tricuspid valve is normal in structure. Tricuspid  valve regurgitation is mild . No evidence of tricuspid stenosis.   Aortic Valve: The aortic valve is normal in structure. Aortic valve  regurgitation is not visualized. No aortic stenosis is present.   Pulmonic Valve: The pulmonic valve was normal in structure. Pulmonic valve  regurgitation is mild. No evidence of pulmonic stenosis.   Aorta: The aortic root is normal in size and structure. There is  borderline dilatation of the ascending aorta, measuring 39 mm.   Venous: The inferior vena cava is normal in size with greater than 50%  respiratory variability, suggesting right atrial pressure of 3 mmHg.     Rt and Lt cardiac cath   Prox RCA lesion is 50% stenosed.   Mid LAD lesion is 50% stenosed.   Prox LAD lesion is 40% stenosed.   2nd Mrg lesion is 30% stenosed.   Mild to moderate non-obstructive CAD Normal right and left heart pressures   Continue medical management of CAD.     ASSESSMENT AND PLAN:  1.  Orthostatic hypotension - with significant symptoms.  Lightheadedness  dizziness, weakness.  I discussed with Dr. Johney Frame and once feels better pt had refused to go to ER.  We did adjust his meds with more freq atrial tach stopped coreg and changed to metoprolol 12.5 bID.  With plans once BP controlled to go back to coreg.  His wt is down 11 lbs since discharge and renal adjusted his PD yesterday.  Pt was feeling better and his nephew was to pick him up but sitting in chair for 10 min and had near syncope, placed back on exam table and EMS non emergent called.  Pt was agreeable to go to ER because he could not manage at home alone.  Afebrile here, temp 98 no fevers at home. Consider infection with recent UTI and abd pain. Imdur on hold as well.  Prior hospital notes reviewed.   2.  HTN normally elevated but for over a week drops down, he has held meds for a week.    3.  Atrial tach more freq episodes.  Off BB coreg changed to metoprolol at lower dose for less BP effect.    4.  ESRD on PD per renal. He is having renal transplant work up.   5. Recent chest pain and cardiac cath with non obstructive CAD.    6.  Mod to severe MR.    .  Will arrange follow up in a week.    Current medicines are reviewed with the patient today.  The patient Has no concerns regarding medicines.  The following changes have been made:  See above Labs/ tests ordered today include:see above  Disposition:   FU:  see above  Signed, Cecilie Kicks, NP  10/10/2021 4:35 PM    [facility], [place], [place], [place] Phone: [phone]; Fax: [phone]

## 2021-10-10 ENCOUNTER — Emergency Department (HOSPITAL_COMMUNITY): Payer: Medicare Other

## 2021-10-10 ENCOUNTER — Telehealth: Payer: Self-pay | Admitting: Cardiovascular Disease

## 2021-10-10 ENCOUNTER — Ambulatory Visit (INDEPENDENT_AMBULATORY_CARE_PROVIDER_SITE_OTHER): Payer: Medicare Other | Admitting: Cardiology

## 2021-10-10 ENCOUNTER — Emergency Department (HOSPITAL_COMMUNITY)
Admission: EM | Admit: 2021-10-10 | Discharge: 2021-10-10 | Disposition: A | Discharge status: Home / Self Care | Payer: Medicare Other | Attending: Emergency Medicine | Admitting: Emergency Medicine

## 2021-10-10 ENCOUNTER — Encounter: Payer: Self-pay | Admitting: Cardiology

## 2021-10-10 ENCOUNTER — Other Ambulatory Visit: Payer: Self-pay

## 2021-10-10 ENCOUNTER — Encounter (HOSPITAL_COMMUNITY): Payer: Self-pay

## 2021-10-10 VITALS — BP 90/60 | HR 66 | Ht 70.0 in | Wt 201.0 lb

## 2021-10-10 DIAGNOSIS — R531 Weakness: Secondary | ICD-10-CM | POA: Diagnosis not present

## 2021-10-10 DIAGNOSIS — I517 Cardiomegaly: Secondary | ICD-10-CM | POA: Diagnosis not present

## 2021-10-10 DIAGNOSIS — Z7951 Long term (current) use of inhaled steroids: Secondary | ICD-10-CM | POA: Insufficient documentation

## 2021-10-10 DIAGNOSIS — Z20822 Contact with and (suspected) exposure to covid-19: Secondary | ICD-10-CM | POA: Insufficient documentation

## 2021-10-10 DIAGNOSIS — I5032 Chronic diastolic (congestive) heart failure: Secondary | ICD-10-CM | POA: Insufficient documentation

## 2021-10-10 DIAGNOSIS — E860 Dehydration: Secondary | ICD-10-CM | POA: Insufficient documentation

## 2021-10-10 DIAGNOSIS — R55 Syncope and collapse: Secondary | ICD-10-CM | POA: Insufficient documentation

## 2021-10-10 DIAGNOSIS — I471 Supraventricular tachycardia: Secondary | ICD-10-CM | POA: Diagnosis not present

## 2021-10-10 DIAGNOSIS — I499 Cardiac arrhythmia, unspecified: Secondary | ICD-10-CM | POA: Diagnosis not present

## 2021-10-10 DIAGNOSIS — Z955 Presence of coronary angioplasty implant and graft: Secondary | ICD-10-CM | POA: Insufficient documentation

## 2021-10-10 DIAGNOSIS — I951 Orthostatic hypotension: Secondary | ICD-10-CM | POA: Diagnosis not present

## 2021-10-10 DIAGNOSIS — I340 Nonrheumatic mitral (valve) insufficiency: Secondary | ICD-10-CM

## 2021-10-10 DIAGNOSIS — Z7982 Long term (current) use of aspirin: Secondary | ICD-10-CM | POA: Diagnosis not present

## 2021-10-10 DIAGNOSIS — J45909 Unspecified asthma, uncomplicated: Secondary | ICD-10-CM | POA: Insufficient documentation

## 2021-10-10 DIAGNOSIS — N2581 Secondary hyperparathyroidism of renal origin: Secondary | ICD-10-CM | POA: Diagnosis not present

## 2021-10-10 DIAGNOSIS — Z992 Dependence on renal dialysis: Secondary | ICD-10-CM | POA: Insufficient documentation

## 2021-10-10 DIAGNOSIS — Z21 Asymptomatic human immunodeficiency virus [HIV] infection status: Secondary | ICD-10-CM | POA: Diagnosis not present

## 2021-10-10 DIAGNOSIS — R82998 Other abnormal findings in urine: Secondary | ICD-10-CM | POA: Diagnosis not present

## 2021-10-10 DIAGNOSIS — N186 End stage renal disease: Secondary | ICD-10-CM

## 2021-10-10 DIAGNOSIS — D631 Anemia in chronic kidney disease: Secondary | ICD-10-CM | POA: Diagnosis not present

## 2021-10-10 DIAGNOSIS — Z79899 Other long term (current) drug therapy: Secondary | ICD-10-CM | POA: Diagnosis not present

## 2021-10-10 DIAGNOSIS — I132 Hypertensive heart and chronic kidney disease with heart failure and with stage 5 chronic kidney disease, or end stage renal disease: Secondary | ICD-10-CM | POA: Insufficient documentation

## 2021-10-10 DIAGNOSIS — I10 Essential (primary) hypertension: Secondary | ICD-10-CM

## 2021-10-10 DIAGNOSIS — Z87891 Personal history of nicotine dependence: Secondary | ICD-10-CM | POA: Insufficient documentation

## 2021-10-10 DIAGNOSIS — E039 Hypothyroidism, unspecified: Secondary | ICD-10-CM | POA: Insufficient documentation

## 2021-10-10 DIAGNOSIS — D509 Iron deficiency anemia, unspecified: Secondary | ICD-10-CM | POA: Diagnosis not present

## 2021-10-10 DIAGNOSIS — Z85528 Personal history of other malignant neoplasm of kidney: Secondary | ICD-10-CM | POA: Insufficient documentation

## 2021-10-10 DIAGNOSIS — R0600 Dyspnea, unspecified: Secondary | ICD-10-CM | POA: Diagnosis not present

## 2021-10-10 DIAGNOSIS — R6889 Other general symptoms and signs: Secondary | ICD-10-CM | POA: Diagnosis not present

## 2021-10-10 DIAGNOSIS — Z743 Need for continuous supervision: Secondary | ICD-10-CM | POA: Diagnosis not present

## 2021-10-10 DIAGNOSIS — R000 Tachycardia, unspecified: Secondary | ICD-10-CM | POA: Diagnosis not present

## 2021-10-10 LAB — COMPREHENSIVE METABOLIC PANEL
ALT: 12 U/L (ref 0–44)
AST: 17 U/L (ref 15–41)
Albumin: 4.2 g/dL (ref 3.5–5.0)
Alkaline Phosphatase: 80 U/L (ref 38–126)
Anion gap: 22 — ABNORMAL HIGH (ref 5–15)
BUN: 63 mg/dL — ABNORMAL HIGH (ref 6–20)
CO2: 20 mmol/L — ABNORMAL LOW (ref 22–32)
Calcium: 10.4 mg/dL — ABNORMAL HIGH (ref 8.9–10.3)
Chloride: 91 mmol/L — ABNORMAL LOW (ref 98–111)
Creatinine, Ser: 20.19 mg/dL — ABNORMAL HIGH (ref 0.61–1.24)
GFR, Estimated: 2 mL/min — ABNORMAL LOW (ref >60)
Glucose, Bld: 116 mg/dL — ABNORMAL HIGH (ref 70–99)
Potassium: 3.3 mmol/L — ABNORMAL LOW (ref 3.5–5.1)
Sodium: 133 mmol/L — ABNORMAL LOW (ref 135–145)
Total Bilirubin: 0.6 mg/dL (ref 0.3–1.2)
Total Protein: 8.2 g/dL — ABNORMAL HIGH (ref 6.5–8.1)

## 2021-10-10 LAB — CBC WITH DIFFERENTIAL/PLATELET
Abs Immature Granulocytes: 0.03 10*3/uL (ref 0.00–0.07)
Basophils Absolute: 0 10*3/uL (ref 0.0–0.1)
Basophils Relative: 1 %
Eosinophils Absolute: 0.1 10*3/uL (ref 0.0–0.5)
Eosinophils Relative: 1 %
HCT: 29 % — ABNORMAL LOW (ref 39.0–52.0)
Hemoglobin: 10 g/dL — ABNORMAL LOW (ref 13.0–17.0)
Immature Granulocytes: 0 %
Lymphocytes Relative: 28 %
Lymphs Abs: 2.2 10*3/uL (ref 0.7–4.0)
MCH: 34.2 pg — ABNORMAL HIGH (ref 26.0–34.0)
MCHC: 34.5 g/dL (ref 30.0–36.0)
MCV: 99.3 fL (ref 80.0–100.0)
Monocytes Absolute: 1.1 10*3/uL — ABNORMAL HIGH (ref 0.1–1.0)
Monocytes Relative: 13 %
Neutro Abs: 4.6 10*3/uL (ref 1.7–7.7)
Neutrophils Relative %: 57 %
Platelets: 283 10*3/uL (ref 150–400)
RBC: 2.92 MIL/uL — ABNORMAL LOW (ref 4.22–5.81)
RDW: 17 % — ABNORMAL HIGH (ref 11.5–15.5)
WBC: 8 10*3/uL (ref 4.0–10.5)
nRBC: 0 % (ref 0.0–0.2)

## 2021-10-10 LAB — URINALYSIS, ROUTINE W REFLEX MICROSCOPIC
Bilirubin Urine: NEGATIVE
Glucose, UA: NEGATIVE mg/dL
Ketones, ur: NEGATIVE mg/dL
Nitrite: NEGATIVE
Protein, ur: >300 mg/dL — AB
Specific Gravity, Urine: >1.03 — ABNORMAL HIGH (ref 1.005–1.030)
pH: 5.5 (ref 5.0–8.0)

## 2021-10-10 LAB — BRAIN NATRIURETIC PEPTIDE: B Natriuretic Peptide: 38.4 pg/mL (ref 0.0–100.0)

## 2021-10-10 LAB — TROPONIN I (HIGH SENSITIVITY)
Troponin I (High Sensitivity): 108 ng/L (ref <18)
Troponin I (High Sensitivity): 92 ng/L — ABNORMAL HIGH (ref <18)

## 2021-10-10 LAB — URINALYSIS, MICROSCOPIC (REFLEX)

## 2021-10-10 LAB — RESP PANEL BY RT-PCR (FLU A&B, COVID) ARPGX2
Influenza A by PCR: NEGATIVE
Influenza B by PCR: NEGATIVE
SARS Coronavirus 2 by RT PCR: NEGATIVE

## 2021-10-10 IMAGING — DX DG CHEST 1V PORT
1 series · 1 of 1 positions shown · non-contrast
Comparison: Chest x-ray dated [DATE].

CLINICAL DATA: Dyspnea.

EXAM:
PORTABLE CHEST 1 VIEW

[chest]
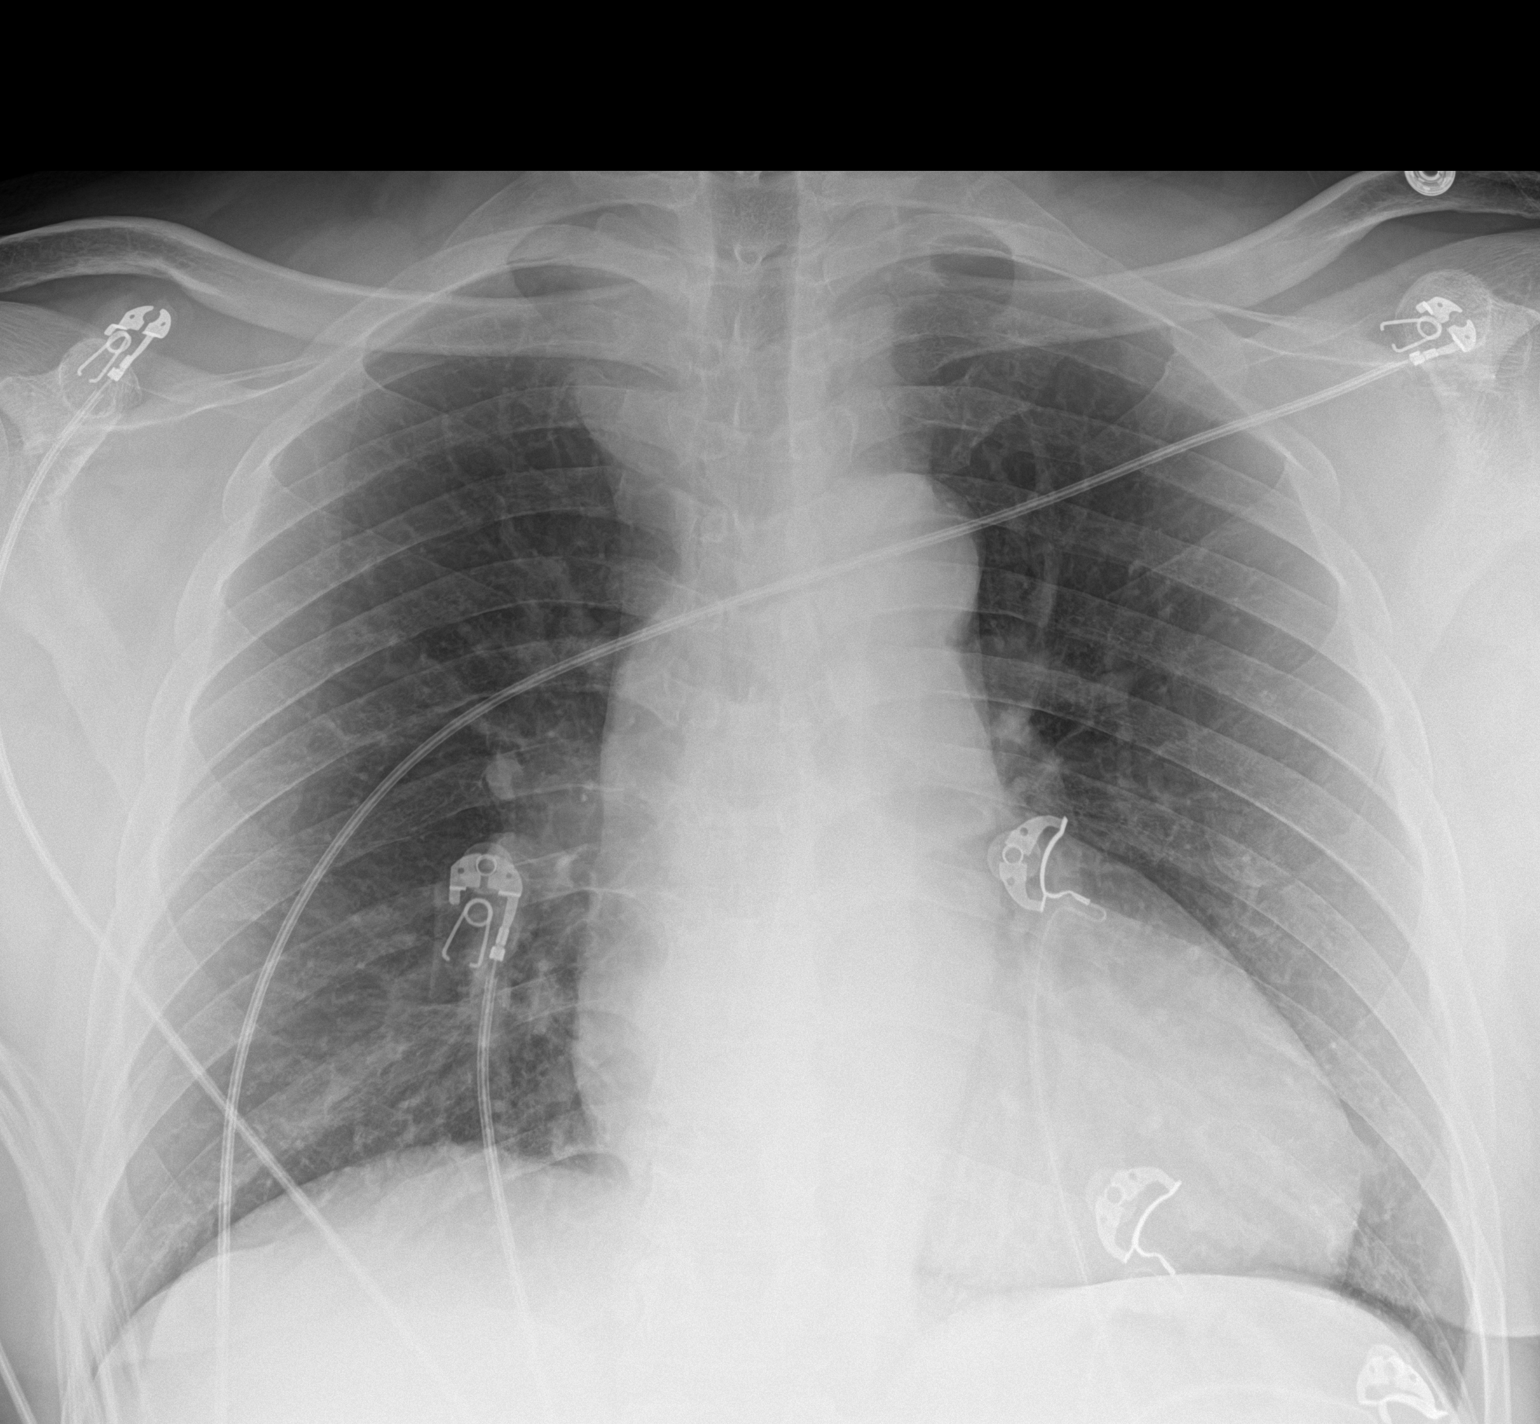

[1 of 1 positions shown; findings below may reference images not displayed]

FINDINGS: Unchanged mild cardiomegaly. Normal pulmonary vascularity. No focal
consolidation, pleural effusion, or pneumothorax. No acute osseous
abnormality.
IMPRESSION: No active disease.

## 2021-10-10 MED ORDER — SODIUM CHLORIDE 0.9 % IV BOLUS
500.0000 mL | Freq: Once | INTRAVENOUS | Status: AC
  Administered 2021-10-10: 500 mL via INTRAVENOUS

## 2021-10-10 MED ORDER — METOPROLOL TARTRATE 25 MG PO TABS: 12.5000 mg | ORAL_TABLET | Freq: Two times a day (BID) | ORAL | 3 refills | Status: DC

## 2021-10-10 NOTE — ED Provider Notes (Signed)
[facility] EMERGENCY DEPARTMENT Provider Note   CSN: [id number] Arrival date & time: 10/10/21  1631     History Chief Complaint  Patient presents with   Weakness    Albert Eaton is a 55 y.o. male.  The history is provided by the patient and medical records.  Weakness Severity:  Moderate Onset quality:  Gradual Duration:  1 week Timing:  Constant Progression:  Unchanged Chronicity:  New Context comment:  Hx ESRD on PD Relieved by:  Nothing Worsened by:  Standing and activity Ineffective treatments:  Rest and drinking fluids Associated symptoms: dizziness   Associated symptoms: no abdominal pain, no arthralgias, no chest pain, no cough, no dysuria, no fever, no seizures, no shortness of breath and no vomiting       Past Medical History:  Diagnosis Date   Anemia    Aortic atherosclerosis (HCC)    Arthritis    Asthma    Cancer (HCC)    renal cyst   Chronic diastolic CHF (congestive heart failure) ([place]) 01/06/2017   Echo 7/16 [facility] in [place], [name]) Mild AI, mild LAE, mild concentric LVH, EF 55, normal wall motion, mild to moderate MR, mild PI, RVSP 55 // Echo 10/09/16 ([facility]):  Moderate LVH, grade 2 diastolic dysfunction, mild MR, moderate LAE    CKD (chronic kidney disease)    Dialysis Mon Wed Fri   Esophagitis    GERD (gastroesophageal reflux disease)    Gout    no current problems   Heart murmur    never caused any problems   Hepatitis    Hep B   History of nuclear stress test    a. Nuc study 7/16: no scar or ischemia, EF 42 // b. Nuc study 12/17: EF 48, ?small apical ischemia, Low Risk   HIV (human immunodeficiency virus infection) ([place])    HLD (hyperlipidemia)    Hypertension    Hypertensive heart disease with CHF (congestive heart failure) ([place]) 10/09/2016   Hypothyroidism    Mitral regurgitation    OSA (obstructive sleep apnea)    Post-operative nausea and vomiting    1 time as a child, no problems as an adult   Sleep  apnea    uses c-pap   Thyroid disease    Wears glasses    Wears partial dentures     Patient Active Problem List   Diagnosis Date Noted   Chest pain of uncertain etiology    Elevated troponin    NSTEMI (non-ST elevated myocardial infarction) ([place]) 09/03/2021   Essential hypertension 09/03/2021   Renal cell carcinoma ([place]) 06/27/2021   SVT (supraventricular tachycardia) ([place]) 01/18/2021   Lower abdominal pain 06/26/2020   Pulsatile tinnitus of right ear 11/21/2019   Kidney failure 05/39/7673   Acute diastolic CHF (congestive heart failure) ([place]) 11/21/2019   Chills (without fever) 11/07/2019   Hypercalcemia 10/17/2019   Dependence on renal dialysis ([place]) 06/06/2019   Coagulation defect, unspecified ([place]) 05/23/2019   Iron deficiency anemia, unspecified 05/16/2019   Anemia in other chronic diseases classified elsewhere 05/13/2019   Arteriovenous fistula, acquired ([place]) 05/13/2019   Body mass index (BMI) 28.0-28.9, adult 05/13/2019   Crohn's disease of small intestine without complications ([place]) 41/93/7902   Encounter for screening for respiratory tuberculosis 05/13/2019   Gout, unspecified 05/13/2019   Nausea 05/13/2019   Other fatigue 05/13/2019   Secondary hyperparathyroidism of renal origin ([place]) 05/13/2019   End stage renal disease ([place]) 05/13/2019   Chronic diastolic (congestive) heart failure ([place]) 05/13/2019  Chronic kidney disease, unspecified 05/13/2019   Obstructive sleep apnea 05/13/2019   Discomfort of right ear 02/18/2019   Hypokalemia 06/13/2018   Abnormal CT scan, small bowel    Perennial allergic rhinitis with a predominant nonallergic component 02/09/2018   Allergic conjunctivitis 02/09/2018   Mild intermittent asthma 02/09/2018   Atherosclerosis 12/09/2017   Hypothyroidism 11/19/2017   Mixed hyperlipidemia 10/13/2017   Personal history of gout 10/13/2017   S/P cholecystectomy 10/13/2017   Congestive heart failure with LV diastolic dysfunction, NYHA  class 1 ([place]) 10/13/2017   Hypothyroidism (acquired) 10/13/2017   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[place]) 07/31/2017   SOB (shortness of breath) 06/07/2017   Arthritis, gouty 01/27/2017   Chronic diastolic CHF (congestive heart failure) ([place]) 01/06/2017   OSA (obstructive sleep apnea) 01/06/2017   Abnormal electrocardiogram (ECG) (EKG) 10/31/2016   Chest pain 10/09/2016   HIV disease ([place]) 10/09/2016   Hypertensive heart disease with CHF (congestive heart failure) ([place]) 10/09/2016   Left-sided low back pain without sciatica 05/29/2015   NYHA class 2 heart failure with preserved ejection fraction ([place]) 05/15/2015   PAH (pulmonary artery hypertension) ([place]) 05/08/2015   Severe mitral regurgitation 05/04/2015   Prolonged Q-T interval on ECG 05/04/2015    Past Surgical History:  Procedure Laterality Date   AV FISTULA PLACEMENT Left 07/02/2018   Procedure: Creation of Left arm BRACHIOBASILIC ARTERIOVENOUS  FISTULA;  Surgeon: Marty Heck, MD;  Location: [place];  Service: Vascular;  Laterality: Left;   [place] Left 08/27/2018   Procedure: Left arm BRACHIOBASILIC VEIN TRANSPOSITION SECOND STAGE;  Surgeon: Marty Heck, MD;  Location: [name] OR;  Service: Vascular;  Laterality: Left;   BUBBLE STUDY  09/03/2020   Procedure: BUBBLE STUDY;  Surgeon: Fay Records, MD;  Location: [place];  Service: Cardiovascular;;   CAPD INSERTION N/A 05/30/2021   Procedure: LAPAROSCOPIC INSERTION CONTINUOUS AMBULATORY PERITONEAL DIALYSIS  (CAPD) CATHETER;  Surgeon: [name], MD;  Location: [facility] OR;  Service: Vascular;  Laterality: N/A;   CHOLECYSTECTOMY     COLONOSCOPY W/ BIOPSIES AND POLYPECTOMY     GIVENS CAPSULE STUDY N/A 03/01/2018   Procedure: GIVENS CAPSULE STUDY;  Surgeon: Jerene Bears, MD;  Location: [place];  Service: Gastroenterology;  Laterality: N/A;   HERNIA REPAIR     As baby   MULTIPLE TOOTH EXTRACTIONS     NEPHRECTOMY Left 02/18/2021   NOSE SURGERY      RENAL BIOPSY     RIGHT/LEFT HEART CATH AND CORONARY ANGIOGRAPHY N/A 09/04/2021   Procedure: RIGHT/LEFT HEART CATH AND CORONARY ANGIOGRAPHY;  Surgeon: Burnell Blanks, MD;  Location: [place] CV LAB;  Service: Cardiovascular;  Laterality: N/A;   TEE WITHOUT CARDIOVERSION N/A 09/03/2020   Procedure: TRANSESOPHAGEAL ECHOCARDIOGRAM (TEE);  Surgeon: Fay Records, MD;  Location: [facility] ENDOSCOPY;  Service: Cardiovascular;  Laterality: N/A;   UPPER GI ENDOSCOPY  04/05/2021       Family History  Problem Relation Age of Onset   Heart failure Mother    Heart attack Maternal Grandmother 54   Hypertension Sister    Multiple sclerosis Sister    Hypertension Brother    Hypertension Sister    Scoliosis Other    Allergic rhinitis Neg Hx    Angioedema Neg Hx    Asthma Neg Hx    Eczema Neg Hx    Immunodeficiency Neg Hx    Urticaria Neg Hx    Colon cancer Neg Hx    Pancreatic cancer Neg Hx  Esophageal cancer Neg Hx     Social History   Tobacco Use   Smoking status: Former    Packs/day: 1.00    Types: Cigarettes    Quit date: 2000    Years since quitting: 22.9   Smokeless tobacco: Never  Vaping Use   Vaping Use: Never used  Substance Use Topics   Alcohol use: Yes    Comment: occasionally liquor   Drug use: No    Home Medications Prior to Admission medications   Medication Sig Start Date End Date Taking? Authorizing Provider  ACETAMINOPHEN EXTRA STRENGTH 500 MG tablet Take 500 mg by mouth every 6 (six) hours as needed (pain.). 02/20/21   [provider]  albuterol (PROAIR HFA) 108 (90 Base) MCG/ACT inhaler INHALE 2 PUFFS INTO THE LUNGS EVERY 4 HOURS AS NEEDED FOR WHEEZING OR SHORTNESS OF BREATH 08/07/21   Valentina Shaggy, MD  allopurinol (ZYLOPRIM) 100 MG tablet TAKE 1 TABLET(100 MG) BY MOUTH TWICE DAILY FOR 60 DOSES 10/03/21   [name], FNP  amLODipine (NORVASC) 10 MG tablet Take 1 tablet (10 mg total) by mouth daily. Patient taking differently: Take  10 mg by mouth daily at 12 noon. At midday 10/31/16   [name], FNP  aspirin EC 81 MG tablet Take 1 tablet (81 mg total) by mouth daily. Swallow whole. Patient taking differently: Take 81 mg by mouth in the morning. Swallow whole. 03/06/21   Richardson Dopp T, PA-C  azelastine (ASTELIN) 0.1 % nasal spray PLACE 2 SPRAYS INTO EACH NOSTRIL TWICE DAILY AS NEEDED FOR RHINITIS AS DIRECTED Patient taking differently: Place 2 sprays into both nostrils 2 (two) times daily as needed for allergies or rhinitis. 06/05/21   Valentina Shaggy, MD  BIKTARVY [phone] MG TABS tablet TAKE 1 TABLET BY MOUTH DAILY 07/08/21   Campbell Riches, MD  calcitRIOL (ROCALTROL) 0.5 MCG capsule Take 0.5 mcg by mouth every Monday, Wednesday, and Friday. 02/06/21   [provider]  chlorproMAZINE (THORAZINE) 10 MG tablet Take 1 tablet (10 mg total) by mouth 3 (three) times daily as needed for hiccoughs. Patient taking differently: Take 10 mg by mouth 3 (three) times daily. 02/26/21   [name], FNP  clobetasol (OLUX) 0.05 % topical foam Apply topically 2 (two) times daily. 08/21/21   Lavonna Monarch, MD  clonazePAM (KLONOPIN) 0.5 MG tablet Take 1 tablet (0.5 mg total) by mouth at bedtime. 01/02/21   Laurin Coder, MD  clotrimazole-betamethasone (LOTRISONE) cream Apply topically 2 (two) times daily. X 3-5 days. IF symptoms continue, call office Patient taking differently: Apply 1 application topically 2 (two) times daily as needed (rectal discomfort). 05/16/21   Pyrtle, Lajuan Lines, MD  docusate sodium (COLACE) 100 MG capsule Take 100 mg by mouth 2 (two) times daily. 02/20/21   [provider]  fluticasone (FLOVENT HFA) 110 MCG/ACT inhaler INHALE 2 PUFFS INTO THE LUNGS TWICE DAILY Patient taking differently: Inhale 2 puffs into the lungs 2 (two) times daily as needed (sob/wheezing). 06/24/21   Valentina Shaggy, MD  gabapentin (NEURONTIN) 300 MG capsule TAKE 1 CAPSULE(300 MG) BY MOUTH THREE TIMES  DAILY Patient taking differently: Take 300 mg by mouth 3 (three) times daily. 10/09/20   [name], FNP  gentamicin cream (GARAMYCIN) 0.1 % Apply 1 application topically See admin instructions. Apply when catheter is flushed and cleaned 05/28/21   [provider]  hydrALAZINE (APRESOLINE) 100 MG tablet Take 1 tablet (100 mg total) by mouth 3 (three)  times daily. 04/02/21 04/02/22  [name], MD  hyoscyamine (LEVSIN SL) 0.125 MG SL tablet Place 1-2 tablets (0.125-0.25 mg total) under the tongue every 8 (eight) hours as needed (lower abdominal pain, spasm). 08/05/19   Pyrtle, Lajuan Lines, MD  ipratropium (ATROVENT) 0.06 % nasal spray USE 2 SPRAYS IN EACH NOSTRIL 2 TO 3 TIMES DAILY AS NEEDED Patient taking differently: Place 2 sprays into both nostrils 3 (three) times daily as needed (allergies). 09/03/20   Valentina Shaggy, MD  iron sucrose (VENOFER) 20 MG/ML injection Inject into the vein. 07/12/20   [provider]  lactulose (CHRONULAC) 10 GM/15ML solution Take 20 g by mouth daily as needed for moderate constipation.  07/08/20   [provider]  levothyroxine (SYNTHROID) 25 MCG tablet TAKE 1 TABLET BY MOUTH DAILY BEFORE BREAKFAST Patient taking differently: Take 25 mcg by mouth daily before breakfast. 02/05/21   [name], FNP  lidocaine-prilocaine (EMLA) cream Apply 1 application topically See admin instructions. Apply to access site 1-2 hours before dialysis 06/01/19   [provider]  lubiprostone (AMITIZA) 24 MCG capsule TAKE 1 CAPSULE(24 MCG) BY MOUTH TWICE DAILY WITH A MEAL Patient taking differently: 24 mcg 2 (two) times daily as needed for constipation. 10/03/21   Pyrtle, Lajuan Lines, MD  methocarbamol (ROBAXIN) 500 MG tablet Take 1 tablet (500 mg total) by mouth every 8 (eight) hours as needed for muscle spasms. Patient taking differently: Take 500 mg by mouth at bedtime. 02/11/21   Maudie Flakes, MD  Methoxy PEG-Epoetin Beta (MIRCERA IJ) Inject into  the skin. 11/08/20   [provider]  metoprolol tartrate (LOPRESSOR) 25 MG tablet Take 0.5 tablets (12.5 mg total) by mouth 2 (two) times daily. 10/10/21 01/08/22  Isaiah Serge, NP  multivitamin (RENA-VIT) TABS tablet Take 1 tablet by mouth at bedtime. 01/10/21   [provider]  mupirocin ointment (BACTROBAN) 2 % Apply 1 application topically daily as needed (apply to port after dialysis).  04/24/20   [provider]  Olopatadine HCl (PAZEO) 0.7 % SOLN Place 1 drop into both eyes daily. Patient taking differently: Place 1 drop into both eyes daily as needed (itchy eyes). 05/24/20   Valentina Shaggy, MD  pantoprazole (PROTONIX) 40 MG tablet TAKE 1 TABLET(40 MG) BY MOUTH DAILY 08/07/21   Esterwood, Amy S, PA-C  rOPINIRole (REQUIP) 1 MG tablet Take 1 tablet (1 mg total) by mouth at bedtime. 08/12/21   Olalere, Cicero Duck A, MD  rosuvastatin (CRESTOR) 10 MG tablet Take 10 mg by mouth daily.    [provider]  senna (SENOKOT) 8.6 MG TABS tablet Take 2 tablets by mouth every evening. 02/20/21   [provider]  SENSIPAR 30 MG tablet 30 mg every evening. 09/17/21   [provider]  sevelamer carbonate (RENVELA) 800 MG tablet Take 1,600 mg by mouth 3 (three) times daily with meals. 11/09/20   [provider]  Spacer/Aero-Holding Josiah Lobo DEVI 1 Device by Does not apply route 2 (two) times a day. 03/01/19   Bobbitt, Sedalia Muta, MD  Vitamin D, Ergocalciferol, (DRISDOL) 1.25 MG (50000 UNIT) CAPS capsule TAKE 1 CAPSULE BY MOUTH EVERY 7 DAYS Patient taking differently: Take 50,000 Units by mouth every Tuesday. At midday 02/05/21   [name], FNP    Allergies    Ace inhibitors and Lisinopril  Review of Systems   Review of Systems  Constitutional:  Negative for chills and fever.  HENT:  Negative for ear pain and sore throat.  Eyes:  Negative for pain and visual disturbance.  Respiratory:  Negative for cough and shortness of breath.    Cardiovascular:  Negative for chest pain and palpitations.  Gastrointestinal:  Negative for abdominal pain and vomiting.  Genitourinary:  Negative for dysuria and hematuria.  Musculoskeletal:  Negative for arthralgias and back pain.  Skin:  Negative for color change and rash.  Neurological:  Positive for dizziness and weakness. Negative for seizures and syncope.  All other systems reviewed and are negative.  Physical Exam Updated Vital Signs BP (!) 142/100    Pulse 83    Temp 98.7 F (37.1 C) (Oral)    Resp 16    Ht 5' 10"  (1.778 m)    Wt 91.2 kg    SpO2 99%    BMI 28.85 kg/m   Physical Exam Vitals and nursing note reviewed.  Constitutional:      General: He is not in acute distress.    Appearance: Normal appearance. He is well-developed. He is not ill-appearing.  HENT:     Head: Normocephalic and atraumatic.     Right Ear: External ear normal.     Left Ear: External ear normal.     Nose: Nose normal. No congestion.     Mouth/Throat:     Mouth: Mucous membranes are moist.     Pharynx: Oropharynx is clear. No posterior oropharyngeal erythema.  Eyes:     Extraocular Movements: Extraocular movements intact.     Conjunctiva/sclera: Conjunctivae normal.     Pupils: Pupils are equal, round, and reactive to light.  Cardiovascular:     Rate and Rhythm: Normal rate and regular rhythm.     Pulses: Normal pulses.     Heart sounds: No murmur heard. Pulmonary:     Effort: Pulmonary effort is normal. No respiratory distress.     Breath sounds: Normal breath sounds. No wheezing, rhonchi or rales.  Abdominal:     General: Abdomen is flat. Bowel sounds are normal.     Palpations: Abdomen is soft.     Tenderness: There is no abdominal tenderness. There is no guarding or rebound.  Musculoskeletal:        General: No swelling, tenderness or deformity. Normal range of motion.     Cervical back: Normal range of motion and neck supple. No rigidity.  Skin:    General: Skin is warm and dry.      Capillary Refill: Capillary refill takes less than 2 seconds.     Findings: No rash.  Neurological:     General: No focal deficit present.     Mental Status: He is alert and oriented to person, place, and time.     Cranial Nerves: No cranial nerve deficit.     Sensory: No sensory deficit.     Motor: Weakness (generalized) present.     Coordination: Coordination normal.     Gait: Gait normal.  Psychiatric:        Mood and Affect: Mood normal.    ED Results / Procedures / Treatments   Labs (all labs ordered are listed, but only abnormal results are displayed) Labs Reviewed  CBC WITH DIFFERENTIAL/PLATELET - Abnormal; Notable for the following components:      Result Value   RBC 2.92 (*)    Hemoglobin 10.0 (*)    HCT 29.0 (*)    MCH 34.2 (*)    RDW 17.0 (*)    Monocytes Absolute 1.1 (*)    All other components within normal limits  COMPREHENSIVE  METABOLIC PANEL - Abnormal; Notable for the following components:   Sodium 133 (*)    Potassium 3.3 (*)    Chloride 91 (*)    CO2 20 (*)    Glucose, Bld 116 (*)    BUN 63 (*)    Creatinine, Ser 20.19 (*)    Calcium 10.4 (*)    Total Protein 8.2 (*)    GFR, Estimated 2 (*)    Anion gap 22 (*)    All other components within normal limits  URINALYSIS, ROUTINE W REFLEX MICROSCOPIC - Abnormal; Notable for the following components:   Specific Gravity, Urine >1.030 (*)    Hgb urine dipstick SMALL (*)    Protein, ur >300 (*)    Leukocytes,Ua TRACE (*)    All other components within normal limits  URINALYSIS, MICROSCOPIC (REFLEX) - Abnormal; Notable for the following components:   Bacteria, UA FEW (*)    All other components within normal limits  TROPONIN I (HIGH SENSITIVITY) - Abnormal; Notable for the following components:   Troponin I (High Sensitivity) 108 (*)    All other components within normal limits  TROPONIN I (HIGH SENSITIVITY) - Abnormal; Notable for the following components:   Troponin I (High Sensitivity) 92 (*)    All  other components within normal limits  RESP PANEL BY RT-PCR (FLU A&B, COVID) ARPGX2  BRAIN NATRIURETIC PEPTIDE    EKG EKG Interpretation  Date/Time:  Thursday October 10 2021 16:38:05 EST Ventricular Rate:  91 PR Interval:  164 QRS Duration: 91 QT Interval:  382 QTC Calculation: 470 R Axis:   38 Text Interpretation: Sinus rhythm Atrial premature complexes ST elevation, consider anterior injury Since last tracing Inferior T wave changes have resolved Confirmed by Calvert Cantor [phone]) on 10/10/2021 5:29:30 PM  Radiology DG Chest Portable 1 View  Result Date: 10/10/2021 CLINICAL DATA:  Dyspnea. EXAM: PORTABLE CHEST 1 VIEW COMPARISON:  Chest x-ray dated September 03, 2021. FINDINGS: Unchanged mild cardiomegaly. Normal pulmonary vascularity. No focal consolidation, pleural effusion, or pneumothorax. No acute osseous abnormality. IMPRESSION: No active disease. Electronically Signed   By: Titus Dubin M.D.   On: 10/10/2021 17:27    Procedures Procedures   Medications Ordered in ED Medications  sodium chloride 0.9 % bolus 500 mL (0 mLs Intravenous Stopped 10/10/21 1843)    ED Course  I have reviewed the triage vital signs and the nursing notes.  Pertinent labs & imaging results that were available during my care of the patient were reviewed by me and considered in my medical decision making (see chart for details).    MDM Rules/Calculators/A&P                          This is a 55 year old male with history of ESRD on peritoneal dialysis who is presenting with weakness and orthostatic dizziness.  Similar visit to the ED 2 days ago.  Prior records reviewed.  The patient has lost nearly 11 pounds since his last admission in November.  There are concerns for overdiuresis and hypovolemia.  Nephrology recently reduced the patient volume removal and his antihypertensives have been held.  He is afebrile, hemodynamically stable here.  Exam as above.  Chest x-ray showed mild cardiomegaly  but no acute cardiopulmonary process.  EKG showed sinus rhythm with nonspecific ST changes.  He is not having any current chest pain.  Troponin elevated, but downtrending.  Similar to prior.  Scheduled for later negative.  His white count is  normal.  He is afebrile here.  Low concern for sepsis or intra-abdominal infection.  CMP revealed signs of kidney dysfunction, similar to prior.  He has no emergent needs for dialysis at this time.  UA obtained and showed no signs of acute infection.  Due to concerns for hypovolemia, he was given a 500 cc bolus of fluids.  On reassessment, he is resting comfortably.  Able to stand and ambulate without difficulty.  Orthostatic vital signs are normal.  At this time, offered admission for further evaluation of his near syncopal episodes.  Patient defers at this time and states that he is feeling improved.  Requesting discharge home.  I encouraged close follow-up with his PCP and cardiology team for reassessment.  May benefit from updated cardiac testing.  Also encourage patient to follow-up with nephrology to discuss his recurrent symptoms in the setting of dialysis.  Patient agreeable with plan.  Strict return ED precautions provided.   Final Clinical Impression(s) / ED Diagnoses Final diagnoses:  Generalized weakness  Near syncope  Dehydration    Rx / DC Orders ED Discharge Orders     None        Idamae Lusher, MD 10/10/21 2353    Truddie Hidden, MD 10/11/21 1000

## 2021-10-10 NOTE — Patient Instructions (Signed)
Medication Instructions:  Your physician has recommended you make the following change in your medication:   STOP Carvedilol  STOP Isorsobide   START Lopressor 25 taking 1/2 twice a day   *If you need a refill on your cardiac medications before your next appointment, please call your pharmacy*   Lab Work: None ordered  If you have labs (blood work) drawn today and your tests are completely normal, you will receive your results only by: [place] (if you have MyChart) OR A paper copy in the mail If you have any lab test that is abnormal or we need to change your treatment, we will call you to review the results.   Testing/Procedures: None ordered   Follow-Up: At [facility], you and your health needs are our priority.  As part of our continuing mission to provide you with exceptional heart care, we have created designated Provider Care Teams.  These Care Teams include your primary Cardiologist (physician) and Advanced Practice Providers (APPs -  Physician Assistants and Nurse Practitioners) who all work together to provide you with the care you need, when you need it.  We recommend signing up for the patient portal called "MyChart".  Sign up information is provided on this After Visit Summary.  MyChart is used to connect with patients for Virtual Visits (Telemedicine).  Patients are able to view lab/test results, encounter notes, upcoming appointments, etc.  Non-urgent messages can be sent to your provider as well.   To learn more about what you can do with MyChart, go to [email or web address].    Your next appointment:   10/18/21 ARRIVE AT 11:30  The format for your next appointment:   In Person  Provider:   Candee Furbish, MD     Other Instructions

## 2021-10-10 NOTE — ED Triage Notes (Signed)
Pt arrived via [facility] from cardiology office due to cardiologist unable to palpate a bp upon pt standing when he was doing orthostatics. Per EMS pt states has been weakx1wk. Pt does peritoneal dialysis daily and missed one day this week due to the weakness. Pt stated he does not have an appetite and gets dizzy upon standing. EMS gave NS 391m. Pt is A&Ox4. VSS

## 2021-10-11 ENCOUNTER — Telehealth: Payer: Self-pay | Admitting: Cardiology

## 2021-10-11 DIAGNOSIS — D631 Anemia in chronic kidney disease: Secondary | ICD-10-CM | POA: Diagnosis not present

## 2021-10-11 DIAGNOSIS — N186 End stage renal disease: Secondary | ICD-10-CM | POA: Diagnosis not present

## 2021-10-11 DIAGNOSIS — N2581 Secondary hyperparathyroidism of renal origin: Secondary | ICD-10-CM | POA: Diagnosis not present

## 2021-10-11 DIAGNOSIS — Z79899 Other long term (current) drug therapy: Secondary | ICD-10-CM | POA: Diagnosis not present

## 2021-10-11 DIAGNOSIS — Z992 Dependence on renal dialysis: Secondary | ICD-10-CM | POA: Diagnosis not present

## 2021-10-11 DIAGNOSIS — R82998 Other abnormal findings in urine: Secondary | ICD-10-CM | POA: Diagnosis not present

## 2021-10-11 DIAGNOSIS — D509 Iron deficiency anemia, unspecified: Secondary | ICD-10-CM | POA: Diagnosis not present

## 2021-10-11 NOTE — Telephone Encounter (Signed)
Call placed to pt.  He has been advised that we want to continue with the medication changes we put in place yesterday, before we sent him to the ED. Stop Coreg Stop Imdur Start Lopressor 25 taking 1/2 bid  Pt has been reminded of his appointment with Dr. Marlou Porch, 10/18/2021.    Pt verbalized understanding.

## 2021-10-11 NOTE — Telephone Encounter (Signed)
Please let pt know about appt with Dr. Marlou Porch next week and remind him of change from coreg to lopressor.  Pt went from office to ER yesterday and was given IV fluids.  His BP was improved at discharge from ER.   He should be feeling better today.

## 2021-10-12 DIAGNOSIS — R82998 Other abnormal findings in urine: Secondary | ICD-10-CM | POA: Diagnosis not present

## 2021-10-12 DIAGNOSIS — N186 End stage renal disease: Secondary | ICD-10-CM | POA: Diagnosis not present

## 2021-10-12 DIAGNOSIS — D631 Anemia in chronic kidney disease: Secondary | ICD-10-CM | POA: Diagnosis not present

## 2021-10-12 DIAGNOSIS — N2581 Secondary hyperparathyroidism of renal origin: Secondary | ICD-10-CM | POA: Diagnosis not present

## 2021-10-12 DIAGNOSIS — Z992 Dependence on renal dialysis: Secondary | ICD-10-CM | POA: Diagnosis not present

## 2021-10-12 DIAGNOSIS — D509 Iron deficiency anemia, unspecified: Secondary | ICD-10-CM | POA: Diagnosis not present

## 2021-10-12 DIAGNOSIS — Z79899 Other long term (current) drug therapy: Secondary | ICD-10-CM | POA: Diagnosis not present

## 2021-10-13 ENCOUNTER — Inpatient Hospital Stay: Admission: RE | Admit: 2021-10-13 | Payer: Medicare Other | Source: Ambulatory Visit

## 2021-10-13 DIAGNOSIS — R82998 Other abnormal findings in urine: Secondary | ICD-10-CM | POA: Diagnosis not present

## 2021-10-13 DIAGNOSIS — N2581 Secondary hyperparathyroidism of renal origin: Secondary | ICD-10-CM | POA: Diagnosis not present

## 2021-10-13 DIAGNOSIS — D509 Iron deficiency anemia, unspecified: Secondary | ICD-10-CM | POA: Diagnosis not present

## 2021-10-13 DIAGNOSIS — Z79899 Other long term (current) drug therapy: Secondary | ICD-10-CM | POA: Diagnosis not present

## 2021-10-13 DIAGNOSIS — Z992 Dependence on renal dialysis: Secondary | ICD-10-CM | POA: Diagnosis not present

## 2021-10-13 DIAGNOSIS — D631 Anemia in chronic kidney disease: Secondary | ICD-10-CM | POA: Diagnosis not present

## 2021-10-13 DIAGNOSIS — N186 End stage renal disease: Secondary | ICD-10-CM | POA: Diagnosis not present

## 2021-10-14 DIAGNOSIS — D509 Iron deficiency anemia, unspecified: Secondary | ICD-10-CM | POA: Diagnosis not present

## 2021-10-14 DIAGNOSIS — R82998 Other abnormal findings in urine: Secondary | ICD-10-CM | POA: Diagnosis not present

## 2021-10-14 DIAGNOSIS — N186 End stage renal disease: Secondary | ICD-10-CM | POA: Diagnosis not present

## 2021-10-14 DIAGNOSIS — Z79899 Other long term (current) drug therapy: Secondary | ICD-10-CM | POA: Diagnosis not present

## 2021-10-14 DIAGNOSIS — N2581 Secondary hyperparathyroidism of renal origin: Secondary | ICD-10-CM | POA: Diagnosis not present

## 2021-10-14 DIAGNOSIS — D631 Anemia in chronic kidney disease: Secondary | ICD-10-CM | POA: Diagnosis not present

## 2021-10-14 DIAGNOSIS — Z992 Dependence on renal dialysis: Secondary | ICD-10-CM | POA: Diagnosis not present

## 2021-10-15 DIAGNOSIS — Z992 Dependence on renal dialysis: Secondary | ICD-10-CM | POA: Diagnosis not present

## 2021-10-15 DIAGNOSIS — D631 Anemia in chronic kidney disease: Secondary | ICD-10-CM | POA: Diagnosis not present

## 2021-10-15 DIAGNOSIS — Z79899 Other long term (current) drug therapy: Secondary | ICD-10-CM | POA: Diagnosis not present

## 2021-10-15 DIAGNOSIS — R82998 Other abnormal findings in urine: Secondary | ICD-10-CM | POA: Diagnosis not present

## 2021-10-15 DIAGNOSIS — N186 End stage renal disease: Secondary | ICD-10-CM | POA: Diagnosis not present

## 2021-10-15 DIAGNOSIS — N2581 Secondary hyperparathyroidism of renal origin: Secondary | ICD-10-CM | POA: Diagnosis not present

## 2021-10-15 DIAGNOSIS — D509 Iron deficiency anemia, unspecified: Secondary | ICD-10-CM | POA: Diagnosis not present

## 2021-10-16 DIAGNOSIS — R82998 Other abnormal findings in urine: Secondary | ICD-10-CM | POA: Diagnosis not present

## 2021-10-16 DIAGNOSIS — Z992 Dependence on renal dialysis: Secondary | ICD-10-CM | POA: Diagnosis not present

## 2021-10-16 DIAGNOSIS — D631 Anemia in chronic kidney disease: Secondary | ICD-10-CM | POA: Diagnosis not present

## 2021-10-16 DIAGNOSIS — N186 End stage renal disease: Secondary | ICD-10-CM | POA: Diagnosis not present

## 2021-10-16 DIAGNOSIS — D509 Iron deficiency anemia, unspecified: Secondary | ICD-10-CM | POA: Diagnosis not present

## 2021-10-16 DIAGNOSIS — N2581 Secondary hyperparathyroidism of renal origin: Secondary | ICD-10-CM | POA: Diagnosis not present

## 2021-10-16 DIAGNOSIS — Z79899 Other long term (current) drug therapy: Secondary | ICD-10-CM | POA: Diagnosis not present

## 2021-10-16 NOTE — Addendum Note (Signed)
Addended by: Gaetano Net on: 10/16/2021 06:45 AM   Modules accepted: Orders

## 2021-10-17 DIAGNOSIS — R82998 Other abnormal findings in urine: Secondary | ICD-10-CM | POA: Diagnosis not present

## 2021-10-17 DIAGNOSIS — N2581 Secondary hyperparathyroidism of renal origin: Secondary | ICD-10-CM | POA: Diagnosis not present

## 2021-10-17 DIAGNOSIS — Z992 Dependence on renal dialysis: Secondary | ICD-10-CM | POA: Diagnosis not present

## 2021-10-17 DIAGNOSIS — G4733 Obstructive sleep apnea (adult) (pediatric): Secondary | ICD-10-CM | POA: Diagnosis not present

## 2021-10-17 DIAGNOSIS — K219 Gastro-esophageal reflux disease without esophagitis: Secondary | ICD-10-CM | POA: Diagnosis not present

## 2021-10-17 DIAGNOSIS — D509 Iron deficiency anemia, unspecified: Secondary | ICD-10-CM | POA: Diagnosis not present

## 2021-10-17 DIAGNOSIS — Z79899 Other long term (current) drug therapy: Secondary | ICD-10-CM | POA: Diagnosis not present

## 2021-10-17 DIAGNOSIS — N186 End stage renal disease: Secondary | ICD-10-CM | POA: Diagnosis not present

## 2021-10-17 DIAGNOSIS — D631 Anemia in chronic kidney disease: Secondary | ICD-10-CM | POA: Diagnosis not present

## 2021-10-18 ENCOUNTER — Ambulatory Visit: Payer: Medicare Other | Admitting: Cardiology

## 2021-10-18 DIAGNOSIS — D509 Iron deficiency anemia, unspecified: Secondary | ICD-10-CM | POA: Diagnosis not present

## 2021-10-18 DIAGNOSIS — N186 End stage renal disease: Secondary | ICD-10-CM | POA: Diagnosis not present

## 2021-10-18 DIAGNOSIS — N2581 Secondary hyperparathyroidism of renal origin: Secondary | ICD-10-CM | POA: Diagnosis not present

## 2021-10-18 DIAGNOSIS — Z79899 Other long term (current) drug therapy: Secondary | ICD-10-CM | POA: Diagnosis not present

## 2021-10-18 DIAGNOSIS — D631 Anemia in chronic kidney disease: Secondary | ICD-10-CM | POA: Diagnosis not present

## 2021-10-18 DIAGNOSIS — R82998 Other abnormal findings in urine: Secondary | ICD-10-CM | POA: Diagnosis not present

## 2021-10-18 DIAGNOSIS — Z992 Dependence on renal dialysis: Secondary | ICD-10-CM | POA: Diagnosis not present

## 2021-10-19 DIAGNOSIS — Z79899 Other long term (current) drug therapy: Secondary | ICD-10-CM | POA: Diagnosis not present

## 2021-10-19 DIAGNOSIS — N186 End stage renal disease: Secondary | ICD-10-CM | POA: Diagnosis not present

## 2021-10-19 DIAGNOSIS — Z992 Dependence on renal dialysis: Secondary | ICD-10-CM | POA: Diagnosis not present

## 2021-10-19 DIAGNOSIS — D509 Iron deficiency anemia, unspecified: Secondary | ICD-10-CM | POA: Diagnosis not present

## 2021-10-19 DIAGNOSIS — R82998 Other abnormal findings in urine: Secondary | ICD-10-CM | POA: Diagnosis not present

## 2021-10-19 DIAGNOSIS — D631 Anemia in chronic kidney disease: Secondary | ICD-10-CM | POA: Diagnosis not present

## 2021-10-19 DIAGNOSIS — N2581 Secondary hyperparathyroidism of renal origin: Secondary | ICD-10-CM | POA: Diagnosis not present

## 2021-10-20 DIAGNOSIS — D631 Anemia in chronic kidney disease: Secondary | ICD-10-CM | POA: Diagnosis not present

## 2021-10-20 DIAGNOSIS — N186 End stage renal disease: Secondary | ICD-10-CM | POA: Diagnosis not present

## 2021-10-20 DIAGNOSIS — Z992 Dependence on renal dialysis: Secondary | ICD-10-CM | POA: Diagnosis not present

## 2021-10-20 DIAGNOSIS — N2581 Secondary hyperparathyroidism of renal origin: Secondary | ICD-10-CM | POA: Diagnosis not present

## 2021-10-20 DIAGNOSIS — Z79899 Other long term (current) drug therapy: Secondary | ICD-10-CM | POA: Diagnosis not present

## 2021-10-20 DIAGNOSIS — D509 Iron deficiency anemia, unspecified: Secondary | ICD-10-CM | POA: Diagnosis not present

## 2021-10-20 DIAGNOSIS — R82998 Other abnormal findings in urine: Secondary | ICD-10-CM | POA: Diagnosis not present

## 2021-10-21 DIAGNOSIS — Z79899 Other long term (current) drug therapy: Secondary | ICD-10-CM | POA: Diagnosis not present

## 2021-10-21 DIAGNOSIS — D631 Anemia in chronic kidney disease: Secondary | ICD-10-CM | POA: Diagnosis not present

## 2021-10-21 DIAGNOSIS — N2581 Secondary hyperparathyroidism of renal origin: Secondary | ICD-10-CM | POA: Diagnosis not present

## 2021-10-21 DIAGNOSIS — Z992 Dependence on renal dialysis: Secondary | ICD-10-CM | POA: Diagnosis not present

## 2021-10-21 DIAGNOSIS — R82998 Other abnormal findings in urine: Secondary | ICD-10-CM | POA: Diagnosis not present

## 2021-10-21 DIAGNOSIS — N186 End stage renal disease: Secondary | ICD-10-CM | POA: Diagnosis not present

## 2021-10-21 DIAGNOSIS — D509 Iron deficiency anemia, unspecified: Secondary | ICD-10-CM | POA: Diagnosis not present

## 2021-10-22 DIAGNOSIS — N2581 Secondary hyperparathyroidism of renal origin: Secondary | ICD-10-CM | POA: Diagnosis not present

## 2021-10-22 DIAGNOSIS — Z992 Dependence on renal dialysis: Secondary | ICD-10-CM | POA: Diagnosis not present

## 2021-10-22 DIAGNOSIS — R82998 Other abnormal findings in urine: Secondary | ICD-10-CM | POA: Diagnosis not present

## 2021-10-22 DIAGNOSIS — Z79899 Other long term (current) drug therapy: Secondary | ICD-10-CM | POA: Diagnosis not present

## 2021-10-22 DIAGNOSIS — D631 Anemia in chronic kidney disease: Secondary | ICD-10-CM | POA: Diagnosis not present

## 2021-10-22 DIAGNOSIS — N186 End stage renal disease: Secondary | ICD-10-CM | POA: Diagnosis not present

## 2021-10-22 DIAGNOSIS — D509 Iron deficiency anemia, unspecified: Secondary | ICD-10-CM | POA: Diagnosis not present

## 2021-10-23 DIAGNOSIS — Z992 Dependence on renal dialysis: Secondary | ICD-10-CM | POA: Diagnosis not present

## 2021-10-23 DIAGNOSIS — N2581 Secondary hyperparathyroidism of renal origin: Secondary | ICD-10-CM | POA: Diagnosis not present

## 2021-10-23 DIAGNOSIS — N186 End stage renal disease: Secondary | ICD-10-CM | POA: Diagnosis not present

## 2021-10-23 DIAGNOSIS — R82998 Other abnormal findings in urine: Secondary | ICD-10-CM | POA: Diagnosis not present

## 2021-10-23 DIAGNOSIS — Z79899 Other long term (current) drug therapy: Secondary | ICD-10-CM | POA: Diagnosis not present

## 2021-10-23 DIAGNOSIS — D631 Anemia in chronic kidney disease: Secondary | ICD-10-CM | POA: Diagnosis not present

## 2021-10-23 DIAGNOSIS — D509 Iron deficiency anemia, unspecified: Secondary | ICD-10-CM | POA: Diagnosis not present

## 2021-10-24 DIAGNOSIS — D509 Iron deficiency anemia, unspecified: Secondary | ICD-10-CM | POA: Diagnosis not present

## 2021-10-24 DIAGNOSIS — N186 End stage renal disease: Secondary | ICD-10-CM | POA: Diagnosis not present

## 2021-10-24 DIAGNOSIS — D631 Anemia in chronic kidney disease: Secondary | ICD-10-CM | POA: Diagnosis not present

## 2021-10-24 DIAGNOSIS — R82998 Other abnormal findings in urine: Secondary | ICD-10-CM | POA: Diagnosis not present

## 2021-10-24 DIAGNOSIS — Z79899 Other long term (current) drug therapy: Secondary | ICD-10-CM | POA: Diagnosis not present

## 2021-10-24 DIAGNOSIS — Z992 Dependence on renal dialysis: Secondary | ICD-10-CM | POA: Diagnosis not present

## 2021-10-24 DIAGNOSIS — N2581 Secondary hyperparathyroidism of renal origin: Secondary | ICD-10-CM | POA: Diagnosis not present

## 2021-10-25 DIAGNOSIS — D509 Iron deficiency anemia, unspecified: Secondary | ICD-10-CM | POA: Diagnosis not present

## 2021-10-25 DIAGNOSIS — D631 Anemia in chronic kidney disease: Secondary | ICD-10-CM | POA: Diagnosis not present

## 2021-10-25 DIAGNOSIS — Z79899 Other long term (current) drug therapy: Secondary | ICD-10-CM | POA: Diagnosis not present

## 2021-10-25 DIAGNOSIS — N186 End stage renal disease: Secondary | ICD-10-CM | POA: Diagnosis not present

## 2021-10-25 DIAGNOSIS — N2581 Secondary hyperparathyroidism of renal origin: Secondary | ICD-10-CM | POA: Diagnosis not present

## 2021-10-25 DIAGNOSIS — R82998 Other abnormal findings in urine: Secondary | ICD-10-CM | POA: Diagnosis not present

## 2021-10-25 DIAGNOSIS — Z992 Dependence on renal dialysis: Secondary | ICD-10-CM | POA: Diagnosis not present

## 2021-10-26 DIAGNOSIS — D509 Iron deficiency anemia, unspecified: Secondary | ICD-10-CM | POA: Diagnosis not present

## 2021-10-26 DIAGNOSIS — N2581 Secondary hyperparathyroidism of renal origin: Secondary | ICD-10-CM | POA: Diagnosis not present

## 2021-10-26 DIAGNOSIS — Z992 Dependence on renal dialysis: Secondary | ICD-10-CM | POA: Diagnosis not present

## 2021-10-26 DIAGNOSIS — R82998 Other abnormal findings in urine: Secondary | ICD-10-CM | POA: Diagnosis not present

## 2021-10-26 DIAGNOSIS — Z79899 Other long term (current) drug therapy: Secondary | ICD-10-CM | POA: Diagnosis not present

## 2021-10-26 DIAGNOSIS — I129 Hypertensive chronic kidney disease with stage 1 through stage 4 chronic kidney disease, or unspecified chronic kidney disease: Secondary | ICD-10-CM | POA: Diagnosis not present

## 2021-10-26 DIAGNOSIS — N186 End stage renal disease: Secondary | ICD-10-CM | POA: Diagnosis not present

## 2021-10-26 DIAGNOSIS — D631 Anemia in chronic kidney disease: Secondary | ICD-10-CM | POA: Diagnosis not present

## 2021-10-27 DIAGNOSIS — R82998 Other abnormal findings in urine: Secondary | ICD-10-CM | POA: Diagnosis not present

## 2021-10-27 DIAGNOSIS — Z79899 Other long term (current) drug therapy: Secondary | ICD-10-CM | POA: Diagnosis not present

## 2021-10-27 DIAGNOSIS — D509 Iron deficiency anemia, unspecified: Secondary | ICD-10-CM | POA: Diagnosis not present

## 2021-10-27 DIAGNOSIS — Z4932 Encounter for adequacy testing for peritoneal dialysis: Secondary | ICD-10-CM | POA: Diagnosis not present

## 2021-10-27 DIAGNOSIS — N2581 Secondary hyperparathyroidism of renal origin: Secondary | ICD-10-CM | POA: Diagnosis not present

## 2021-10-27 DIAGNOSIS — Z992 Dependence on renal dialysis: Secondary | ICD-10-CM | POA: Diagnosis not present

## 2021-10-27 DIAGNOSIS — D631 Anemia in chronic kidney disease: Secondary | ICD-10-CM | POA: Diagnosis not present

## 2021-10-27 DIAGNOSIS — N186 End stage renal disease: Secondary | ICD-10-CM | POA: Diagnosis not present

## 2021-10-28 DIAGNOSIS — Z79899 Other long term (current) drug therapy: Secondary | ICD-10-CM | POA: Diagnosis not present

## 2021-10-28 DIAGNOSIS — R82998 Other abnormal findings in urine: Secondary | ICD-10-CM | POA: Diagnosis not present

## 2021-10-28 DIAGNOSIS — N2581 Secondary hyperparathyroidism of renal origin: Secondary | ICD-10-CM | POA: Diagnosis not present

## 2021-10-28 DIAGNOSIS — D509 Iron deficiency anemia, unspecified: Secondary | ICD-10-CM | POA: Diagnosis not present

## 2021-10-28 DIAGNOSIS — Z992 Dependence on renal dialysis: Secondary | ICD-10-CM | POA: Diagnosis not present

## 2021-10-28 DIAGNOSIS — N186 End stage renal disease: Secondary | ICD-10-CM | POA: Diagnosis not present

## 2021-10-28 DIAGNOSIS — D631 Anemia in chronic kidney disease: Secondary | ICD-10-CM | POA: Diagnosis not present

## 2021-10-28 DIAGNOSIS — Z4932 Encounter for adequacy testing for peritoneal dialysis: Secondary | ICD-10-CM | POA: Diagnosis not present

## 2021-10-29 DIAGNOSIS — Z992 Dependence on renal dialysis: Secondary | ICD-10-CM | POA: Diagnosis not present

## 2021-10-29 DIAGNOSIS — D509 Iron deficiency anemia, unspecified: Secondary | ICD-10-CM | POA: Diagnosis not present

## 2021-10-29 DIAGNOSIS — N186 End stage renal disease: Secondary | ICD-10-CM | POA: Diagnosis not present

## 2021-10-29 DIAGNOSIS — Z79899 Other long term (current) drug therapy: Secondary | ICD-10-CM | POA: Diagnosis not present

## 2021-10-29 DIAGNOSIS — Z4932 Encounter for adequacy testing for peritoneal dialysis: Secondary | ICD-10-CM | POA: Diagnosis not present

## 2021-10-29 DIAGNOSIS — R82998 Other abnormal findings in urine: Secondary | ICD-10-CM | POA: Diagnosis not present

## 2021-10-29 DIAGNOSIS — D631 Anemia in chronic kidney disease: Secondary | ICD-10-CM | POA: Diagnosis not present

## 2021-10-29 DIAGNOSIS — N2581 Secondary hyperparathyroidism of renal origin: Secondary | ICD-10-CM | POA: Diagnosis not present

## 2021-10-30 ENCOUNTER — Other Ambulatory Visit: Payer: Self-pay

## 2021-10-30 ENCOUNTER — Ambulatory Visit
Admission: RE | Admit: 2021-10-30 | Discharge: 2021-10-30 | Disposition: A | Discharge status: Home / Self Care | Payer: Medicaid Other | Source: Ambulatory Visit | Attending: Nephrology | Admitting: Nephrology

## 2021-10-30 DIAGNOSIS — Z79899 Other long term (current) drug therapy: Secondary | ICD-10-CM | POA: Diagnosis not present

## 2021-10-30 DIAGNOSIS — Z4932 Encounter for adequacy testing for peritoneal dialysis: Secondary | ICD-10-CM | POA: Diagnosis not present

## 2021-10-30 DIAGNOSIS — D631 Anemia in chronic kidney disease: Secondary | ICD-10-CM | POA: Diagnosis not present

## 2021-10-30 DIAGNOSIS — R82998 Other abnormal findings in urine: Secondary | ICD-10-CM | POA: Diagnosis not present

## 2021-10-30 DIAGNOSIS — N186 End stage renal disease: Secondary | ICD-10-CM | POA: Diagnosis not present

## 2021-10-30 DIAGNOSIS — N2581 Secondary hyperparathyroidism of renal origin: Secondary | ICD-10-CM | POA: Diagnosis not present

## 2021-10-30 DIAGNOSIS — N2889 Other specified disorders of kidney and ureter: Secondary | ICD-10-CM | POA: Diagnosis not present

## 2021-10-30 DIAGNOSIS — Z992 Dependence on renal dialysis: Secondary | ICD-10-CM | POA: Diagnosis not present

## 2021-10-30 DIAGNOSIS — Z9049 Acquired absence of other specified parts of digestive tract: Secondary | ICD-10-CM | POA: Diagnosis not present

## 2021-10-30 DIAGNOSIS — D509 Iron deficiency anemia, unspecified: Secondary | ICD-10-CM | POA: Diagnosis not present

## 2021-10-30 DIAGNOSIS — Z905 Acquired absence of kidney: Secondary | ICD-10-CM | POA: Diagnosis not present

## 2021-10-30 IMAGING — MR MR ABDOMEN W/O CM
8 of 9 series · 40 of 48 positions shown · non-contrast
Comparison: Multiple priors including most recent CT [DATE]

CLINICAL DATA: Follow-up renal lesion.

EXAM:
MRI ABDOMEN WITHOUT CONTRAST
TECHNIQUE: Multiplanar multisequence MR imaging was performed without the
administration of intravenous contrast.

[Series 3: T2 · coronal · 5.0mm · 1.41mm/px · 2 of 20 slices shown (1 of 3)]
[im 1/20]
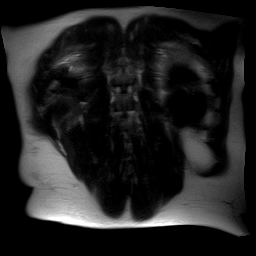
[im 20/20]
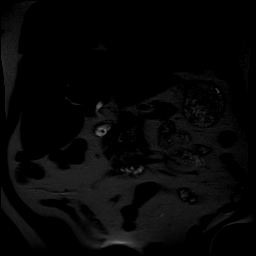

[Series 4: axial tru fisp · axial · 5.0mm · 1.48mm/px · z∈[-82,+68]mm · 3 of 27 slices shown]
[im 1/27]
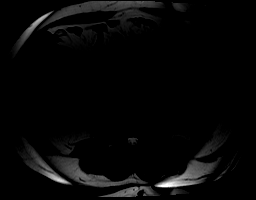
[im 14/27]
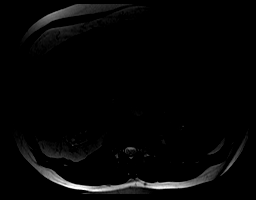
[im 27/27]
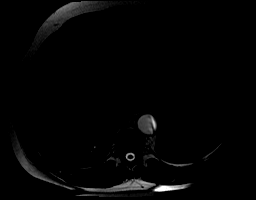

[Series 5: ep2d_diff_b50_500_800_p2 · axial · 5.0mm · 1.98mm/px · z∈[-76,+74]mm · 11 of 81 slices shown]
[im 1/81]
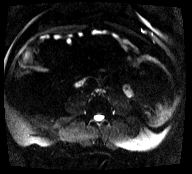
[im 9/81]
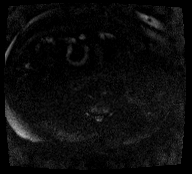
[im 17/81]
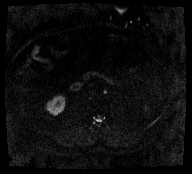
[im 25/81]
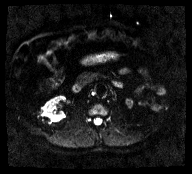
[im 33/81]
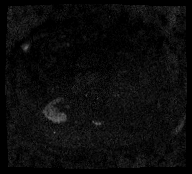
[im 41/81]
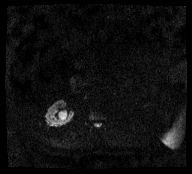
[im 49/81]
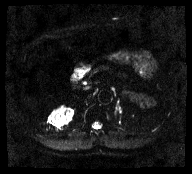
[im 57/81]
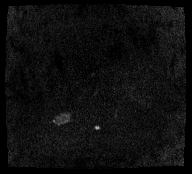
[im 65/81]
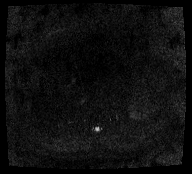
[im 73/81]
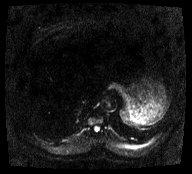
[im 81/81]
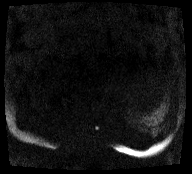

[Series 6: ep2d_diff_b50_500_800_p2_adc · axial · 5.0mm · 1.98mm/px · z∈[-76,+74]mm · 4 of 27 slices shown]
[im 1/27]
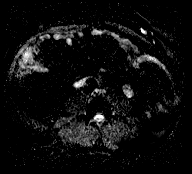
[im 9/27]
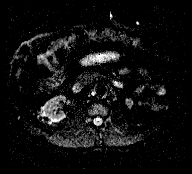
[im 18/27]
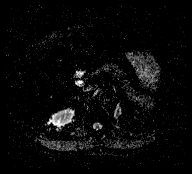
[im 27/27]
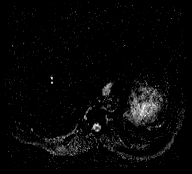

[Series 7: T2 · axial · 5.0mm · 0.74mm/px · z∈[-82,+68]mm · 4 of 27 slices shown (2 of 3)]
[im 1/27]
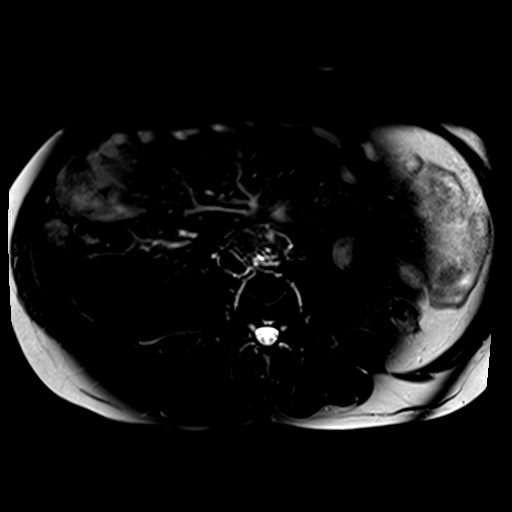
[im 9/27]
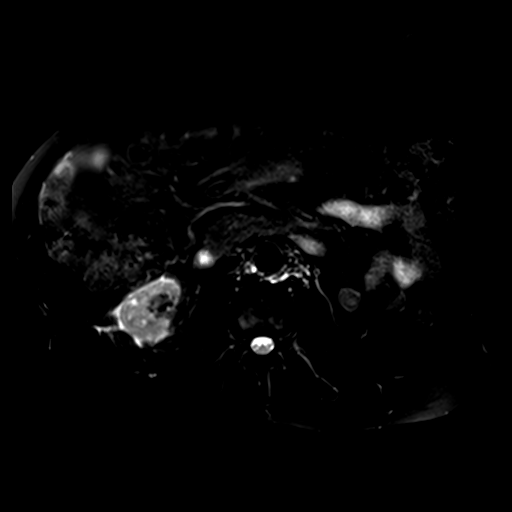
[im 18/27]
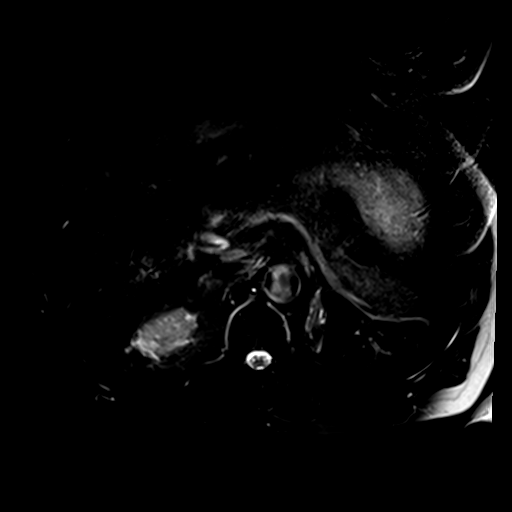
[im 27/27]
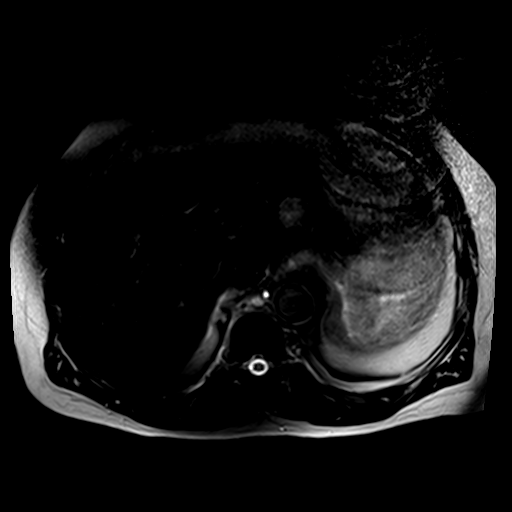

[Series 8: T2 · axial · 5.0mm · 1.48mm/px · z∈[-86,+63]mm · 4 of 27 slices shown (3 of 3)]
[im 1/27]
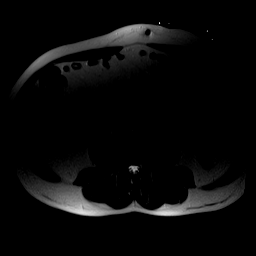
[im 9/27]
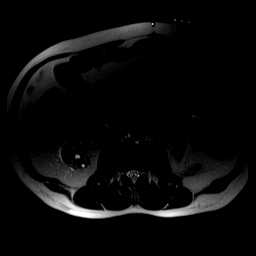
[im 18/27]
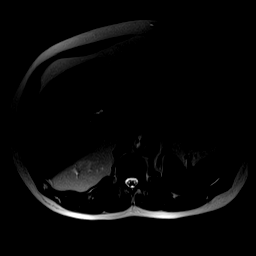
[im 27/27]
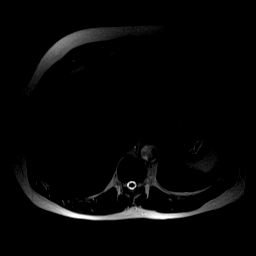

[Series 9: axial in out · axial · 5.5mm · 0.74mm/px · z∈[-87,+63]mm · 7 of 52 slices shown]
[im 1/52]
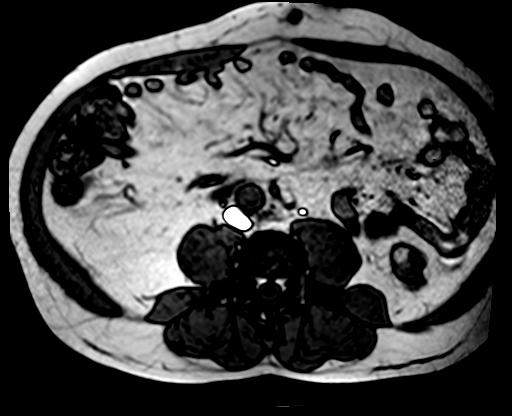
[im 9/52]
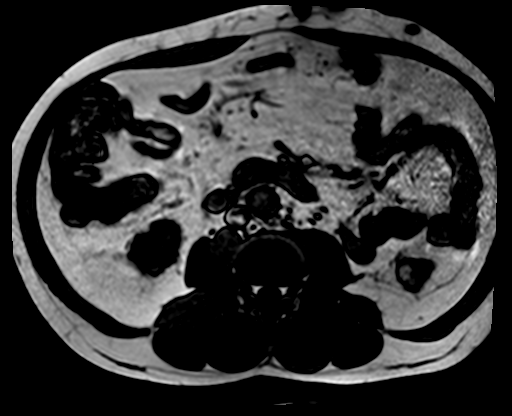
[im 18/52]
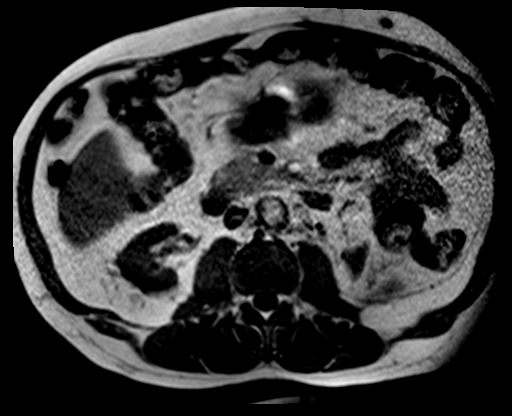
[im 26/52]
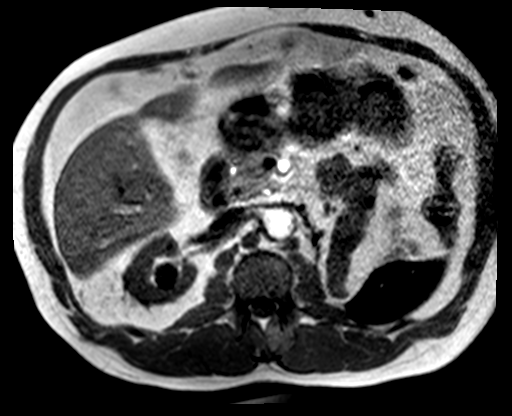
[im 35/52]
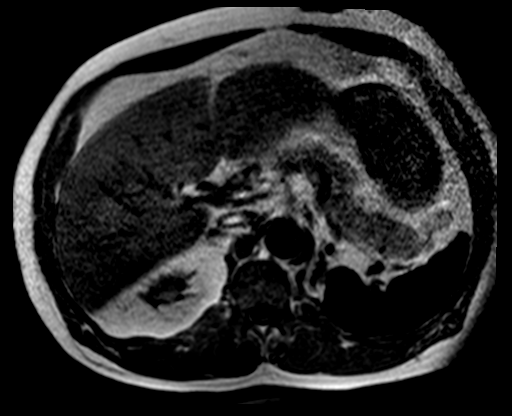
[im 43/52]
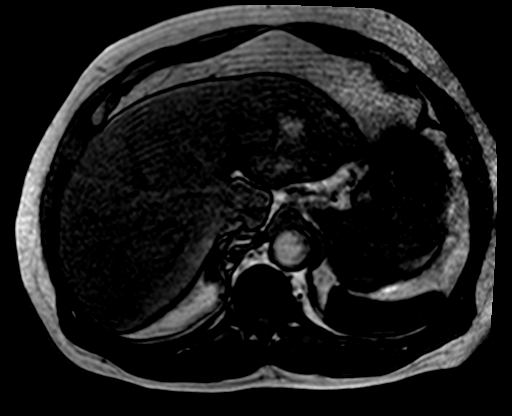
[im 52/52]
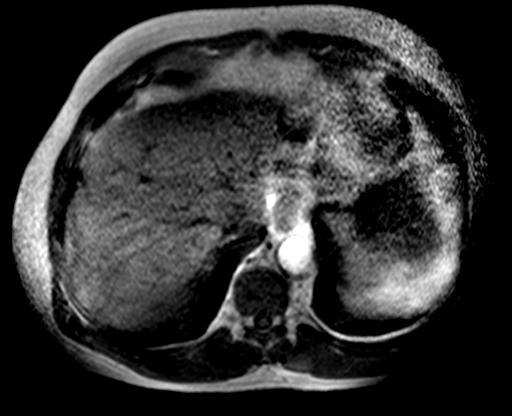

[Series 10: T1 dynamic · axial · non-contrast · 2.0mm · 0.74mm/px · z∈[-83,-7]mm · 5 of 70 slices shown]
[im 1/70]
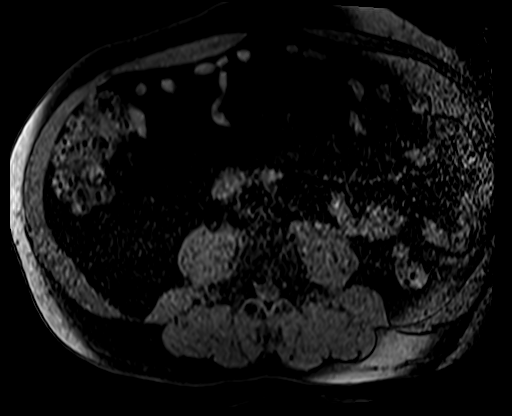
[im 8/70]
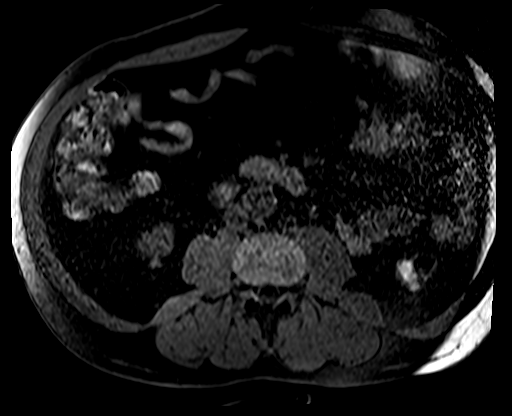
[im 24/70]
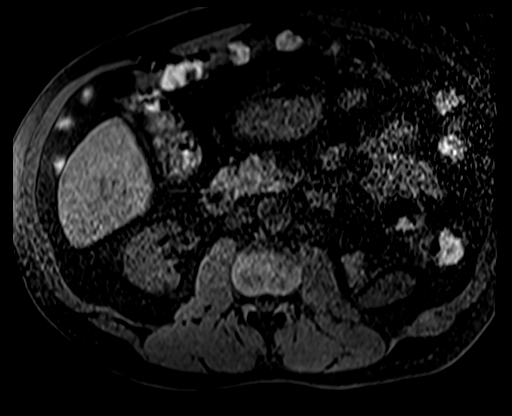
[im 31/70]
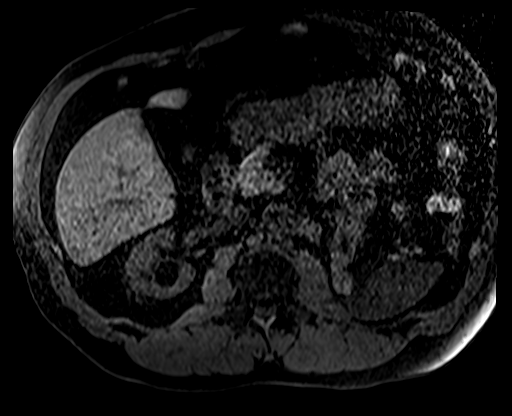
[im 39/70]
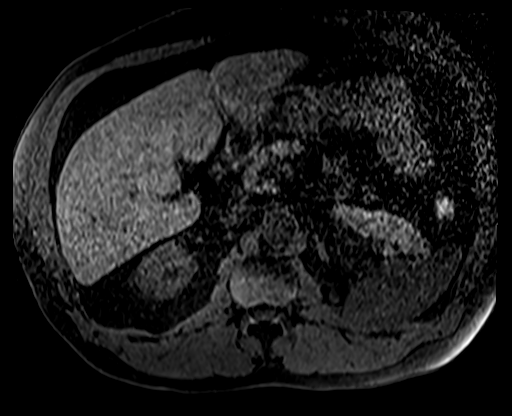

[40 of 48 positions shown; findings below may reference images not displayed]

FINDINGS: Lower chest: No acute abnormality.

Hepatobiliary: No suspicious hepatic lesions on this noncontrast
examination. Mild hepatic iron deposition. Gallbladder surgically
absent. No biliary ductal dilation.

Pancreas: Intrinsic T1 signal of the pancreatic parenchyma is within
normal limits. No pancreatic ductal dilation.

Spleen:  Splenic iron deposition.

Adrenals/Urinary Tract: Bilateral adrenal glands are unremarkable.
Left kidney surgically absent without abnormal soft tissue
nodularity in the nephrectomy bed. Fluid signal lesion in the right
upper pole measuring 1.9 cm, previously measuring 1.5 cm on MRI
[DATE], incompletely evaluated without intravenous
contrast material. Innumerable additional tiny subcentimeter fluid
signal lesions throughout the right kidney are statistically likely
to reflect renal cysts. No obvious solid renal lesions. No
hydronephrosis.

Stomach/Bowel: Visualized portions within the abdomen are
unremarkable.

Vascular/Lymphatic: No pathologically enlarged lymph nodes
identified. No abdominal aortic aneurysm demonstrated.

Other:  No abdominal ascites.

Musculoskeletal: No suspicious bone lesions identified.
IMPRESSION: 1. Fluid signal lesion in the right upper pole measuring 1.9 cm,
previously measuring 1.5 cm on MRI [DATE], without
suspicious noncontrast MRI features but incompletely evaluated
without intravenous contrast material.
2. Innumerable additional tiny subcentimeter fluid signal lesions
throughout the right kidney are not significantly changed in
comparison to prior MR and statistically likely to reflect renal
cysts.
3. Please note that in general noncontrast MRI is of limited value
for lesion characterization and abdominal organs and contrast
strongly preferred for this purpose.
4. No obvious solid renal lesions.

## 2021-10-31 DIAGNOSIS — N186 End stage renal disease: Secondary | ICD-10-CM | POA: Diagnosis not present

## 2021-10-31 DIAGNOSIS — R82998 Other abnormal findings in urine: Secondary | ICD-10-CM | POA: Diagnosis not present

## 2021-10-31 DIAGNOSIS — N2581 Secondary hyperparathyroidism of renal origin: Secondary | ICD-10-CM | POA: Diagnosis not present

## 2021-10-31 DIAGNOSIS — D631 Anemia in chronic kidney disease: Secondary | ICD-10-CM | POA: Diagnosis not present

## 2021-10-31 DIAGNOSIS — Z79899 Other long term (current) drug therapy: Secondary | ICD-10-CM | POA: Diagnosis not present

## 2021-10-31 DIAGNOSIS — Z4932 Encounter for adequacy testing for peritoneal dialysis: Secondary | ICD-10-CM | POA: Diagnosis not present

## 2021-10-31 DIAGNOSIS — Z992 Dependence on renal dialysis: Secondary | ICD-10-CM | POA: Diagnosis not present

## 2021-10-31 DIAGNOSIS — D509 Iron deficiency anemia, unspecified: Secondary | ICD-10-CM | POA: Diagnosis not present

## 2021-11-01 DIAGNOSIS — N2581 Secondary hyperparathyroidism of renal origin: Secondary | ICD-10-CM | POA: Diagnosis not present

## 2021-11-01 DIAGNOSIS — Z79899 Other long term (current) drug therapy: Secondary | ICD-10-CM | POA: Diagnosis not present

## 2021-11-01 DIAGNOSIS — D631 Anemia in chronic kidney disease: Secondary | ICD-10-CM | POA: Diagnosis not present

## 2021-11-01 DIAGNOSIS — Z992 Dependence on renal dialysis: Secondary | ICD-10-CM | POA: Diagnosis not present

## 2021-11-01 DIAGNOSIS — Z4932 Encounter for adequacy testing for peritoneal dialysis: Secondary | ICD-10-CM | POA: Diagnosis not present

## 2021-11-01 DIAGNOSIS — R82998 Other abnormal findings in urine: Secondary | ICD-10-CM | POA: Diagnosis not present

## 2021-11-01 DIAGNOSIS — D509 Iron deficiency anemia, unspecified: Secondary | ICD-10-CM | POA: Diagnosis not present

## 2021-11-01 DIAGNOSIS — N186 End stage renal disease: Secondary | ICD-10-CM | POA: Diagnosis not present

## 2021-11-02 ENCOUNTER — Other Ambulatory Visit: Payer: Self-pay | Admitting: Family Medicine

## 2021-11-02 DIAGNOSIS — Z4932 Encounter for adequacy testing for peritoneal dialysis: Secondary | ICD-10-CM | POA: Diagnosis not present

## 2021-11-02 DIAGNOSIS — N2581 Secondary hyperparathyroidism of renal origin: Secondary | ICD-10-CM | POA: Diagnosis not present

## 2021-11-02 DIAGNOSIS — D509 Iron deficiency anemia, unspecified: Secondary | ICD-10-CM | POA: Diagnosis not present

## 2021-11-02 DIAGNOSIS — N186 End stage renal disease: Secondary | ICD-10-CM | POA: Diagnosis not present

## 2021-11-02 DIAGNOSIS — R82998 Other abnormal findings in urine: Secondary | ICD-10-CM | POA: Diagnosis not present

## 2021-11-02 DIAGNOSIS — Z992 Dependence on renal dialysis: Secondary | ICD-10-CM | POA: Diagnosis not present

## 2021-11-02 DIAGNOSIS — D631 Anemia in chronic kidney disease: Secondary | ICD-10-CM | POA: Diagnosis not present

## 2021-11-02 DIAGNOSIS — Z79899 Other long term (current) drug therapy: Secondary | ICD-10-CM | POA: Diagnosis not present

## 2021-11-02 DIAGNOSIS — M109 Gout, unspecified: Secondary | ICD-10-CM

## 2021-11-03 DIAGNOSIS — Z4932 Encounter for adequacy testing for peritoneal dialysis: Secondary | ICD-10-CM | POA: Diagnosis not present

## 2021-11-03 DIAGNOSIS — R82998 Other abnormal findings in urine: Secondary | ICD-10-CM | POA: Diagnosis not present

## 2021-11-03 DIAGNOSIS — D509 Iron deficiency anemia, unspecified: Secondary | ICD-10-CM | POA: Diagnosis not present

## 2021-11-03 DIAGNOSIS — Z992 Dependence on renal dialysis: Secondary | ICD-10-CM | POA: Diagnosis not present

## 2021-11-03 DIAGNOSIS — N2581 Secondary hyperparathyroidism of renal origin: Secondary | ICD-10-CM | POA: Diagnosis not present

## 2021-11-03 DIAGNOSIS — D631 Anemia in chronic kidney disease: Secondary | ICD-10-CM | POA: Diagnosis not present

## 2021-11-03 DIAGNOSIS — N186 End stage renal disease: Secondary | ICD-10-CM | POA: Diagnosis not present

## 2021-11-03 DIAGNOSIS — Z79899 Other long term (current) drug therapy: Secondary | ICD-10-CM | POA: Diagnosis not present

## 2021-11-04 DIAGNOSIS — D631 Anemia in chronic kidney disease: Secondary | ICD-10-CM | POA: Diagnosis not present

## 2021-11-04 DIAGNOSIS — N186 End stage renal disease: Secondary | ICD-10-CM | POA: Diagnosis not present

## 2021-11-04 DIAGNOSIS — Z992 Dependence on renal dialysis: Secondary | ICD-10-CM | POA: Diagnosis not present

## 2021-11-04 DIAGNOSIS — N2581 Secondary hyperparathyroidism of renal origin: Secondary | ICD-10-CM | POA: Diagnosis not present

## 2021-11-04 DIAGNOSIS — Z4932 Encounter for adequacy testing for peritoneal dialysis: Secondary | ICD-10-CM | POA: Diagnosis not present

## 2021-11-04 DIAGNOSIS — Z79899 Other long term (current) drug therapy: Secondary | ICD-10-CM | POA: Diagnosis not present

## 2021-11-04 DIAGNOSIS — R82998 Other abnormal findings in urine: Secondary | ICD-10-CM | POA: Diagnosis not present

## 2021-11-04 DIAGNOSIS — D509 Iron deficiency anemia, unspecified: Secondary | ICD-10-CM | POA: Diagnosis not present

## 2021-11-05 DIAGNOSIS — R82998 Other abnormal findings in urine: Secondary | ICD-10-CM | POA: Diagnosis not present

## 2021-11-05 DIAGNOSIS — N2581 Secondary hyperparathyroidism of renal origin: Secondary | ICD-10-CM | POA: Diagnosis not present

## 2021-11-05 DIAGNOSIS — D631 Anemia in chronic kidney disease: Secondary | ICD-10-CM | POA: Diagnosis not present

## 2021-11-05 DIAGNOSIS — D509 Iron deficiency anemia, unspecified: Secondary | ICD-10-CM | POA: Diagnosis not present

## 2021-11-05 DIAGNOSIS — Z4932 Encounter for adequacy testing for peritoneal dialysis: Secondary | ICD-10-CM | POA: Diagnosis not present

## 2021-11-05 DIAGNOSIS — N186 End stage renal disease: Secondary | ICD-10-CM | POA: Diagnosis not present

## 2021-11-05 DIAGNOSIS — Z992 Dependence on renal dialysis: Secondary | ICD-10-CM | POA: Diagnosis not present

## 2021-11-05 DIAGNOSIS — Z79899 Other long term (current) drug therapy: Secondary | ICD-10-CM | POA: Diagnosis not present

## 2021-11-06 DIAGNOSIS — Z992 Dependence on renal dialysis: Secondary | ICD-10-CM | POA: Diagnosis not present

## 2021-11-06 DIAGNOSIS — D631 Anemia in chronic kidney disease: Secondary | ICD-10-CM | POA: Diagnosis not present

## 2021-11-06 DIAGNOSIS — Z79899 Other long term (current) drug therapy: Secondary | ICD-10-CM | POA: Diagnosis not present

## 2021-11-06 DIAGNOSIS — N2581 Secondary hyperparathyroidism of renal origin: Secondary | ICD-10-CM | POA: Diagnosis not present

## 2021-11-06 DIAGNOSIS — Z4932 Encounter for adequacy testing for peritoneal dialysis: Secondary | ICD-10-CM | POA: Diagnosis not present

## 2021-11-06 DIAGNOSIS — D509 Iron deficiency anemia, unspecified: Secondary | ICD-10-CM | POA: Diagnosis not present

## 2021-11-06 DIAGNOSIS — R82998 Other abnormal findings in urine: Secondary | ICD-10-CM | POA: Diagnosis not present

## 2021-11-06 DIAGNOSIS — N186 End stage renal disease: Secondary | ICD-10-CM | POA: Diagnosis not present

## 2021-11-07 DIAGNOSIS — R82998 Other abnormal findings in urine: Secondary | ICD-10-CM | POA: Diagnosis not present

## 2021-11-07 DIAGNOSIS — Z4932 Encounter for adequacy testing for peritoneal dialysis: Secondary | ICD-10-CM | POA: Diagnosis not present

## 2021-11-07 DIAGNOSIS — Z992 Dependence on renal dialysis: Secondary | ICD-10-CM | POA: Diagnosis not present

## 2021-11-07 DIAGNOSIS — Z79899 Other long term (current) drug therapy: Secondary | ICD-10-CM | POA: Diagnosis not present

## 2021-11-07 DIAGNOSIS — N186 End stage renal disease: Secondary | ICD-10-CM | POA: Diagnosis not present

## 2021-11-07 DIAGNOSIS — D509 Iron deficiency anemia, unspecified: Secondary | ICD-10-CM | POA: Diagnosis not present

## 2021-11-07 DIAGNOSIS — D631 Anemia in chronic kidney disease: Secondary | ICD-10-CM | POA: Diagnosis not present

## 2021-11-07 DIAGNOSIS — N2581 Secondary hyperparathyroidism of renal origin: Secondary | ICD-10-CM | POA: Diagnosis not present

## 2021-11-08 DIAGNOSIS — N186 End stage renal disease: Secondary | ICD-10-CM | POA: Diagnosis not present

## 2021-11-08 DIAGNOSIS — D509 Iron deficiency anemia, unspecified: Secondary | ICD-10-CM | POA: Diagnosis not present

## 2021-11-08 DIAGNOSIS — Z992 Dependence on renal dialysis: Secondary | ICD-10-CM | POA: Diagnosis not present

## 2021-11-08 DIAGNOSIS — Z4932 Encounter for adequacy testing for peritoneal dialysis: Secondary | ICD-10-CM | POA: Diagnosis not present

## 2021-11-08 DIAGNOSIS — R82998 Other abnormal findings in urine: Secondary | ICD-10-CM | POA: Diagnosis not present

## 2021-11-08 DIAGNOSIS — Z79899 Other long term (current) drug therapy: Secondary | ICD-10-CM | POA: Diagnosis not present

## 2021-11-08 DIAGNOSIS — N2581 Secondary hyperparathyroidism of renal origin: Secondary | ICD-10-CM | POA: Diagnosis not present

## 2021-11-08 DIAGNOSIS — D631 Anemia in chronic kidney disease: Secondary | ICD-10-CM | POA: Diagnosis not present

## 2021-11-09 DIAGNOSIS — D509 Iron deficiency anemia, unspecified: Secondary | ICD-10-CM | POA: Diagnosis not present

## 2021-11-09 DIAGNOSIS — R82998 Other abnormal findings in urine: Secondary | ICD-10-CM | POA: Diagnosis not present

## 2021-11-09 DIAGNOSIS — Z992 Dependence on renal dialysis: Secondary | ICD-10-CM | POA: Diagnosis not present

## 2021-11-09 DIAGNOSIS — Z4932 Encounter for adequacy testing for peritoneal dialysis: Secondary | ICD-10-CM | POA: Diagnosis not present

## 2021-11-09 DIAGNOSIS — D631 Anemia in chronic kidney disease: Secondary | ICD-10-CM | POA: Diagnosis not present

## 2021-11-09 DIAGNOSIS — N186 End stage renal disease: Secondary | ICD-10-CM | POA: Diagnosis not present

## 2021-11-09 DIAGNOSIS — Z79899 Other long term (current) drug therapy: Secondary | ICD-10-CM | POA: Diagnosis not present

## 2021-11-09 DIAGNOSIS — N2581 Secondary hyperparathyroidism of renal origin: Secondary | ICD-10-CM | POA: Diagnosis not present

## 2021-11-10 DIAGNOSIS — N186 End stage renal disease: Secondary | ICD-10-CM | POA: Diagnosis not present

## 2021-11-10 DIAGNOSIS — Z4932 Encounter for adequacy testing for peritoneal dialysis: Secondary | ICD-10-CM | POA: Diagnosis not present

## 2021-11-10 DIAGNOSIS — R82998 Other abnormal findings in urine: Secondary | ICD-10-CM | POA: Diagnosis not present

## 2021-11-10 DIAGNOSIS — D509 Iron deficiency anemia, unspecified: Secondary | ICD-10-CM | POA: Diagnosis not present

## 2021-11-10 DIAGNOSIS — N2581 Secondary hyperparathyroidism of renal origin: Secondary | ICD-10-CM | POA: Diagnosis not present

## 2021-11-10 DIAGNOSIS — Z79899 Other long term (current) drug therapy: Secondary | ICD-10-CM | POA: Diagnosis not present

## 2021-11-10 DIAGNOSIS — D631 Anemia in chronic kidney disease: Secondary | ICD-10-CM | POA: Diagnosis not present

## 2021-11-10 DIAGNOSIS — Z992 Dependence on renal dialysis: Secondary | ICD-10-CM | POA: Diagnosis not present

## 2021-11-11 ENCOUNTER — Encounter: Payer: Self-pay | Admitting: Nurse Practitioner

## 2021-11-11 ENCOUNTER — Other Ambulatory Visit: Payer: Self-pay

## 2021-11-11 ENCOUNTER — Ambulatory Visit (INDEPENDENT_AMBULATORY_CARE_PROVIDER_SITE_OTHER): Payer: Medicare Other | Admitting: Nurse Practitioner

## 2021-11-11 DIAGNOSIS — G63 Polyneuropathy in diseases classified elsewhere: Secondary | ICD-10-CM | POA: Diagnosis not present

## 2021-11-11 DIAGNOSIS — D689 Coagulation defect, unspecified: Secondary | ICD-10-CM | POA: Diagnosis not present

## 2021-11-11 DIAGNOSIS — I770 Arteriovenous fistula, acquired: Secondary | ICD-10-CM | POA: Diagnosis not present

## 2021-11-11 DIAGNOSIS — N2581 Secondary hyperparathyroidism of renal origin: Secondary | ICD-10-CM | POA: Diagnosis not present

## 2021-11-11 DIAGNOSIS — Z992 Dependence on renal dialysis: Secondary | ICD-10-CM | POA: Diagnosis not present

## 2021-11-11 DIAGNOSIS — I5032 Chronic diastolic (congestive) heart failure: Secondary | ICD-10-CM | POA: Diagnosis not present

## 2021-11-11 DIAGNOSIS — B20 Human immunodeficiency virus [HIV] disease: Secondary | ICD-10-CM | POA: Diagnosis not present

## 2021-11-11 DIAGNOSIS — I209 Angina pectoris, unspecified: Secondary | ICD-10-CM

## 2021-11-11 DIAGNOSIS — K5000 Crohn's disease of small intestine without complications: Secondary | ICD-10-CM | POA: Diagnosis not present

## 2021-11-11 DIAGNOSIS — D509 Iron deficiency anemia, unspecified: Secondary | ICD-10-CM | POA: Diagnosis not present

## 2021-11-11 DIAGNOSIS — C642 Malignant neoplasm of left kidney, except renal pelvis: Secondary | ICD-10-CM | POA: Diagnosis not present

## 2021-11-11 DIAGNOSIS — D631 Anemia in chronic kidney disease: Secondary | ICD-10-CM | POA: Diagnosis not present

## 2021-11-11 DIAGNOSIS — R82998 Other abnormal findings in urine: Secondary | ICD-10-CM | POA: Diagnosis not present

## 2021-11-11 DIAGNOSIS — N186 End stage renal disease: Secondary | ICD-10-CM | POA: Diagnosis not present

## 2021-11-11 DIAGNOSIS — I471 Supraventricular tachycardia: Secondary | ICD-10-CM

## 2021-11-11 DIAGNOSIS — Z4932 Encounter for adequacy testing for peritoneal dialysis: Secondary | ICD-10-CM | POA: Diagnosis not present

## 2021-11-11 DIAGNOSIS — Z79899 Other long term (current) drug therapy: Secondary | ICD-10-CM | POA: Diagnosis not present

## 2021-11-11 DIAGNOSIS — K219 Gastro-esophageal reflux disease without esophagitis: Secondary | ICD-10-CM | POA: Insufficient documentation

## 2021-11-11 MED ORDER — ZIKS ARTHRITIS PAIN RELIEF 0.025-1-12 % EX CREA: 1.0000 "application " | TOPICAL_CREAM | Freq: Four times a day (QID) | CUTANEOUS | 0 refills | Status: DC | PRN

## 2021-11-11 NOTE — Patient Instructions (Signed)
You were seen today in the [facility] for neuropathy. You were prescribed medications, please take as directed. Please follow up in 3 mths for wellness exam.

## 2021-11-11 NOTE — Progress Notes (Signed)
[address] [place], [place]  [address] Phone:  [phone]   Fax:  [phone] Subjective:   Patient ID: Albert Eaton, male    DOB: 1966/02/18, 56 y.o.   MRN: [id number]  Chief Complaint  Patient presents with   Follow-up    Pt is here today for tingling sensation all over his body to were he is not able to sleep x 3 months. Pt states that he has been diagnosed with restless leg syndrome.  Pt also states that he has been having jerks in his hand and head off and on x 2 weeks.   HPI Albert Eaton 55 y.o. male  has a past medical history of Anemia, Aortic atherosclerosis ([place]), Arthritis, Asthma, Cancer ([place]), Chronic diastolic CHF (congestive heart failure) ([place]) (01/06/2017), CKD (chronic kidney disease), Esophagitis, GERD (gastroesophageal reflux disease), Gout, Heart murmur, Hepatitis, History of nuclear stress test, HIV (human immunodeficiency virus infection) ([place]), HLD (hyperlipidemia), Hypertension, Hypertensive heart disease with CHF (congestive heart failure) ([place]) (10/09/2016), Hypothyroidism, Mitral regurgitation, OSA (obstructive sleep apnea), Post-operative nausea and vomiting, Sleep apnea, Thyroid disease, Wears glasses, and Wears partial dentures. To the [facility] for generalized numbness and tingling.  Patient states that he has a history of neuropathy in LUE that has progressed other extremities over the past 3 mths. Patient states that he also has been having intermittent shaking in the left hand intermittently. Has not aken any medications for symptoms. Was previously prescribed gabapentin for symptoms, but does not take medication due to side effects, "It makes me feel loopy." Denies any recent trauma or injury. Requesting a referral to neurology for worsening neuropathy. States that he wants a substitute for gabapentin. Denies any other complaints today. Currently compliant with all other medications. Denies any fever.  Denies any  fatigue, chest pain, shortness of breath, HA or dizziness. Denies any blurred vision, numbness or tingling.   Past Medical History:  Diagnosis Date   Anemia    Aortic atherosclerosis (HCC)    Arthritis    Asthma    Cancer ([place])    renal cyst   Chronic diastolic CHF (congestive heart failure) ([place]) 01/06/2017   Echo 7/16 [facility] in [place], [name]) Mild AI, mild LAE, mild concentric LVH, EF 55, normal wall motion, mild to moderate MR, mild PI, RVSP 55 // Echo 10/09/16 ([facility]):  Moderate LVH, grade 2 diastolic dysfunction, mild MR, moderate LAE    CKD (chronic kidney disease)    Dialysis Mon Wed Fri   Esophagitis    GERD (gastroesophageal reflux disease)    Gout    no current problems   Heart murmur    never caused any problems   Hepatitis    Hep B   History of nuclear stress test    a. Nuc study 7/16: no scar or ischemia, EF 42 // b. Nuc study 12/17: EF 48, ?small apical ischemia, Low Risk   HIV (human immunodeficiency virus infection) ([place])    HLD (hyperlipidemia)    Hypertension    Hypertensive heart disease with CHF (congestive heart failure) ([place]) 10/09/2016   Hypothyroidism    Mitral regurgitation    OSA (obstructive sleep apnea)    Post-operative nausea and vomiting    1 time as a child, no problems as an adult   Sleep apnea    uses c-pap   Thyroid disease    Wears glasses    Wears partial dentures     Past Surgical History:  Procedure  Laterality Date   AV FISTULA PLACEMENT Left 07/02/2018   Procedure: Creation of Left arm BRACHIOBASILIC ARTERIOVENOUS  FISTULA;  Surgeon: Marty Heck, MD;  Location: [place] [facility];  Service: Vascular;  Laterality: Left;   [place] Left 08/27/2018   Procedure: Left arm BRACHIOBASILIC VEIN TRANSPOSITION SECOND STAGE;  Surgeon: Marty Heck, MD;  Location: [place] [facility];  Service: Vascular;  Laterality: Left;   BUBBLE STUDY  09/03/2020   Procedure: BUBBLE STUDY;  Surgeon: Fay Records, MD;  Location: [place];  Service: Cardiovascular;;   CAPD INSERTION N/A 05/30/2021   Procedure: LAPAROSCOPIC INSERTION CONTINUOUS AMBULATORY PERITONEAL DIALYSIS  (CAPD) CATHETER;  Surgeon: [name], MD;  Location: [place];  Service: Vascular;  Laterality: N/A;   CHOLECYSTECTOMY     COLONOSCOPY W/ BIOPSIES AND POLYPECTOMY     GIVENS CAPSULE STUDY N/A 03/01/2018   Procedure: GIVENS CAPSULE STUDY;  Surgeon: Jerene Bears, MD;  Location: [facility];  Service: Gastroenterology;  Laterality: N/A;   HERNIA REPAIR     As baby   MULTIPLE TOOTH EXTRACTIONS     NEPHRECTOMY Left 02/18/2021   NOSE SURGERY     RENAL BIOPSY     RIGHT/LEFT HEART CATH AND CORONARY ANGIOGRAPHY N/A 09/04/2021   Procedure: RIGHT/LEFT HEART CATH AND CORONARY ANGIOGRAPHY;  Surgeon: Burnell Blanks, MD;  Location: [place] CV LAB;  Service: Cardiovascular;  Laterality: N/A;   TEE WITHOUT CARDIOVERSION N/A 09/03/2020   Procedure: TRANSESOPHAGEAL ECHOCARDIOGRAM (TEE);  Surgeon: Fay Records, MD;  Location: [facility] ENDOSCOPY;  Service: Cardiovascular;  Laterality: N/A;   UPPER GI ENDOSCOPY  04/05/2021    Family History  Problem Relation Age of Onset   Heart failure Mother    Heart attack Maternal Grandmother 35   Hypertension Sister    Multiple sclerosis Sister    Hypertension Brother    Hypertension Sister    Scoliosis Other    Allergic rhinitis Neg Hx    Angioedema Neg Hx    Asthma Neg Hx    Eczema Neg Hx    Immunodeficiency Neg Hx    Urticaria Neg Hx    Colon cancer Neg Hx    Pancreatic cancer Neg Hx    Esophageal cancer Neg Hx     Social History   Socioeconomic History   Marital status: Single    Spouse name: Not on file   Number of children: Not on file   Years of education: Not on file   Highest education level: Not on file  Occupational History   Not on file  Tobacco Use   Smoking status: Former    Packs/day: 1.00    Types: Cigarettes    Quit date: 2000    Years since quitting: 23.0   Smokeless  tobacco: Never  Vaping Use   Vaping Use: Never used  Substance and Sexual Activity   Alcohol use: Yes    Comment: occasionally liquor   Drug use: No   Sexual activity: Not Currently    Partners: Male    Birth control/protection: Condom    Comment: declined  condoms 07/11/20  Other Topics Concern   Not on file  Social History Narrative   Not on file   Social Determinants of Health   Financial Resource Strain: Not on file  Food Insecurity: Not on file  Transportation Needs: Not on file  Physical Activity: Not on file  Stress: Not on file  Social Connections: Not on file  Intimate Partner Violence: Not on file  Outpatient Medications Prior to Visit  Medication Sig Dispense Refill   ACETAMINOPHEN EXTRA STRENGTH 500 MG tablet Take 500 mg by mouth every 6 (six) hours as needed (pain.).     albuterol (PROAIR HFA) 108 (90 Base) MCG/ACT inhaler INHALE 2 PUFFS INTO THE LUNGS EVERY 4 HOURS AS NEEDED FOR WHEEZING OR SHORTNESS OF BREATH 17 g 1   allopurinol (ZYLOPRIM) 100 MG tablet TAKE 1 TABLET(100 MG) BY MOUTH TWICE DAILY FOR 60 DOSES 60 tablet 0   amLODipine (NORVASC) 10 MG tablet Take 1 tablet (10 mg total) by mouth daily. (Patient taking differently: Take 10 mg by mouth daily at 12 noon. At midday) 30 tablet 1   aspirin EC 81 MG tablet Take 1 tablet (81 mg total) by mouth daily. Swallow whole. (Patient taking differently: Take 81 mg by mouth in the morning. Swallow whole.) 90 tablet 3   azelastine (ASTELIN) 0.1 % nasal spray PLACE 2 SPRAYS INTO EACH NOSTRIL TWICE DAILY AS NEEDED FOR RHINITIS AS DIRECTED (Patient taking differently: Place 2 sprays into both nostrils 2 (two) times daily as needed for allergies or rhinitis.) 30 mL 5   BIKTARVY 50-200-25 MG TABS tablet TAKE 1 TABLET BY MOUTH DAILY 30 tablet 5   calcitRIOL (ROCALTROL) 0.5 MCG capsule Take 0.5 mcg by mouth every Monday, Wednesday, and Friday.     chlorproMAZINE (THORAZINE) 10 MG tablet Take 1 tablet (10 mg total) by mouth 3  (three) times daily as needed for hiccoughs. (Patient taking differently: Take 10 mg by mouth 3 (three) times daily.) 60 tablet 0   clonazePAM (KLONOPIN) 0.5 MG tablet Take 1 tablet (0.5 mg total) by mouth at bedtime. 30 tablet 2   docusate sodium (COLACE) 100 MG capsule Take 100 mg by mouth 2 (two) times daily.     fluticasone (FLOVENT HFA) 110 MCG/ACT inhaler INHALE 2 PUFFS INTO THE LUNGS TWICE DAILY (Patient taking differently: Inhale 2 puffs into the lungs 2 (two) times daily as needed (sob/wheezing).) 12 g 4   gabapentin (NEURONTIN) 300 MG capsule TAKE 1 CAPSULE(300 MG) BY MOUTH THREE TIMES DAILY (Patient taking differently: Take 300 mg by mouth 3 (three) times daily.) 90 capsule 3   gentamicin cream (GARAMYCIN) 0.1 % Apply 1 application topically See admin instructions. Apply when catheter is flushed and cleaned     hydrALAZINE (APRESOLINE) 100 MG tablet Take 1 tablet (100 mg total) by mouth 3 (three) times daily. 270 tablet 3   hyoscyamine (LEVSIN SL) 0.125 MG SL tablet Place 1-2 tablets (0.125-0.25 mg total) under the tongue every 8 (eight) hours as needed (lower abdominal pain, spasm). 90 tablet 2   ipratropium (ATROVENT) 0.06 % nasal spray USE 2 SPRAYS IN EACH NOSTRIL 2 TO 3 TIMES DAILY AS NEEDED (Patient taking differently: Place 2 sprays into both nostrils 3 (three) times daily as needed (allergies).) 15 mL 1   iron sucrose (VENOFER) 20 MG/ML injection Inject into the vein.     lactulose (CHRONULAC) 10 GM/15ML solution Take 20 g by mouth daily as needed for moderate constipation.      levothyroxine (SYNTHROID) 25 MCG tablet TAKE 1 TABLET BY MOUTH DAILY BEFORE BREAKFAST (Patient taking differently: Take 25 mcg by mouth daily before breakfast.) 90 tablet 3   methocarbamol (ROBAXIN) 500 MG tablet Take 1 tablet (500 mg total) by mouth every 8 (eight) hours as needed for muscle spasms. (Patient taking differently: Take 500 mg by mouth at bedtime.) 30 tablet 0   Methoxy PEG-Epoetin Beta (MIRCERA  IJ) Inject into  the skin.     Methoxy PEG-Epoetin Beta (MIRCERA IJ) Inject into the skin.     metoprolol tartrate (LOPRESSOR) 25 MG tablet Take 0.5 tablets (12.5 mg total) by mouth 2 (two) times daily. 90 tablet 3   multivitamin (RENA-VIT) TABS tablet Take 1 tablet by mouth at bedtime.     mupirocin ointment (BACTROBAN) 2 % Apply 1 application topically daily as needed (apply to port after dialysis).      Olopatadine HCl (PAZEO) 0.7 % SOLN Place 1 drop into both eyes daily. (Patient taking differently: Place 1 drop into both eyes daily as needed (itchy eyes).) 2.5 mL 5   pantoprazole (PROTONIX) 40 MG tablet TAKE 1 TABLET(40 MG) BY MOUTH DAILY 30 tablet 6   rOPINIRole (REQUIP) 1 MG tablet Take 1 tablet (1 mg total) by mouth at bedtime. 30 tablet 3   rosuvastatin (CRESTOR) 10 MG tablet Take 10 mg by mouth daily.     senna (SENOKOT) 8.6 MG TABS tablet Take 2 tablets by mouth every evening.     SENSIPAR 30 MG tablet 30 mg every evening.     sevelamer carbonate (RENVELA) 800 MG tablet Take 1,600 mg by mouth 3 (three) times daily with meals.     Spacer/Aero-Holding Chambers DEVI 1 Device by Does not apply route 2 (two) times a day. 1 each 1   Vitamin D, Ergocalciferol, (DRISDOL) 1.25 MG (50000 UNIT) CAPS capsule TAKE 1 CAPSULE BY MOUTH EVERY 7 DAYS (Patient taking differently: Take 50,000 Units by mouth every Tuesday. At midday) 12 capsule 1   AURYXIA 1 GM 210 MG(Fe) tablet Take by mouth.     clobetasol (OLUX) 0.05 % topical foam Apply topically 2 (two) times daily. 50 g 2   clotrimazole-betamethasone (LOTRISONE) cream Apply topically 2 (two) times daily. X 3-5 days. IF symptoms continue, call office 30 g 0   famotidine (PEPCID) 20 MG tablet Take 20 mg by mouth 2 (two) times daily.     lidocaine-prilocaine (EMLA) cream Apply 1 application topically See admin instructions. Apply to access site 1-2 hours before dialysis     lubiprostone (AMITIZA) 24 MCG capsule TAKE 1 CAPSULE(24 MCG) BY MOUTH TWICE DAILY  WITH A MEAL (Patient taking differently: 24 mcg 2 (two) times daily as needed for constipation.) 60 capsule 4   No facility-administered medications prior to visit.    Allergies  Allergen Reactions   Ace Inhibitors Cough and Other (See Comments)   Lisinopril Cough    Review of Systems  Constitutional:  Negative for chills, fever and malaise/fatigue.  Respiratory:  Negative for cough and shortness of breath.   Cardiovascular:  Negative for chest pain, palpitations and leg swelling.  Gastrointestinal:  Negative for abdominal pain, blood in stool, constipation, diarrhea, nausea and vomiting.  Skin: Negative.   Neurological:  Positive for tingling and sensory change. Negative for dizziness, tremors, speech change, focal weakness, seizures, loss of consciousness, weakness and headaches.  Psychiatric/Behavioral:  Negative for depression. The patient is not nervous/anxious.   All other systems reviewed and are negative.     Objective:    Physical Exam Vitals reviewed.  Constitutional:      General: He is not in acute distress.    Appearance: Normal appearance. He is normal weight.  HENT:     Head: Normocephalic.  Neck:     Vascular: No carotid bruit.  Cardiovascular:     Rate and Rhythm: Normal rate and regular rhythm.     Pulses: Normal pulses.  Heart sounds: Normal heart sounds.     Comments: No obvious peripheral edema Pulmonary:     Effort: Pulmonary effort is normal.     Breath sounds: Normal breath sounds.  Musculoskeletal:        General: No swelling, tenderness, deformity or signs of injury. Normal range of motion.     Cervical back: Normal range of motion and neck supple. No rigidity or tenderness.     Right lower leg: No edema.     Left lower leg: No edema.  Lymphadenopathy:     Cervical: No cervical adenopathy.  Skin:    General: Skin is warm and dry.     Capillary Refill: Capillary refill takes less than 2 seconds.  Neurological:     General: No focal  deficit present.     Mental Status: He is alert and oriented to person, place, and time.  Psychiatric:        Mood and Affect: Mood normal.        Behavior: Behavior normal.        Thought Content: Thought content normal.        Judgment: Judgment normal.    BP (!) 143/90    Pulse (!) 101    Temp 98 F (36.7 C)    Ht 5' 10"  (1.778 m)    Wt 205 lb 12.8 oz (93.4 kg)    SpO2 99%    BMI 29.53 kg/m  Wt Readings from Last 3 Encounters:  11/12/21 203 lb (92.1 kg)  11/11/21 205 lb 12.8 oz (93.4 kg)  10/10/21 201 lb 1 oz (91.2 kg)    Immunization History  Administered Date(s) Administered   Influenza,inj,Quad PF,6+ Mos 10/10/2016, 08/31/2017, 07/08/2018, 06/23/2019, 07/11/2020, 09/05/2021   Meningococcal Conjugate 10/15/2016, 08/31/2017   Meningococcal Mcv4o 10/15/2016, 08/31/2017   PFIZER Comirnaty(Gray Top)Covid-19 Tri-Sucrose Vaccine 06/13/2020, 05/28/2021   PFIZER(Purple Top)SARS-COV-2 Vaccination 01/06/2020, 02/01/2020   Pneumococcal Conjugate-13 12/29/2018   Pneumococcal Polysaccharide-23 10/15/2016, 10/13/2019   Tdap 01/27/2017   Vaccinia,smallpox Monkeypox Vaccine Live,pf 06/06/2021    Diabetic Foot Exam - Simple   No data filed     Lab Results  Component Value Date   TSH 1.870 09/03/2021   Lab Results  Component Value Date   WBC 8.0 10/10/2021   HGB 10.0 (L) 10/10/2021   HCT 29.0 (L) 10/10/2021   MCV 99.3 10/10/2021   PLT 283 10/10/2021   Lab Results  Component Value Date   NA 133 (L) 10/10/2021   K 3.3 (L) 10/10/2021   CO2 20 (L) 10/10/2021   GLUCOSE 116 (H) 10/10/2021   BUN 63 (H) 10/10/2021   CREATININE 20.19 (H) 10/10/2021   BILITOT 0.6 10/10/2021   ALKPHOS 80 10/10/2021   AST 17 10/10/2021   ALT 12 10/10/2021   PROT 8.2 (H) 10/10/2021   ALBUMIN 4.2 10/10/2021   CALCIUM 10.4 (H) 10/10/2021   ANIONGAP 22 (H) 10/10/2021   GFR 6.81 (LL) 04/04/2021   Lab Results  Component Value Date   CHOL 126 09/04/2021   CHOL 137 06/27/2021   CHOL 126  05/01/2021   Lab Results  Component Value Date   HDL 34 (L) 09/04/2021   HDL 33 (L) 06/27/2021   HDL 40 05/01/2021   Lab Results  Component Value Date   LDLCALC 28 09/04/2021   LDLCALC 83 06/27/2021   LDLCALC 60 05/01/2021   Lab Results  Component Value Date   TRIG 320 (H) 09/04/2021   TRIG 114 06/27/2021   TRIG 152 (H) 05/01/2021  Lab Results  Component Value Date   CHOLHDL 3.7 09/04/2021   CHOLHDL 4.2 06/27/2021   CHOLHDL 3.2 05/01/2021   Lab Results  Component Value Date   HGBA1C 5.4 08/08/2021   HGBA1C 5.3 05/01/2020   HGBA1C 5.3 05/01/2020   HGBA1C 5.3 (A) 05/01/2020   HGBA1C 5.3 05/01/2020       Assessment & Plan:   Problem List Items Addressed This Visit       Cardiovascular and Mediastinum   Arteriovenous fistula, acquired ([place])   Chronic diastolic (congestive) heart failure (HCC)     Digestive   Crohn's disease of small intestine without complications ([place])     Endocrine   Secondary hyperparathyroidism of renal origin ([place])     Nervous and Auditory   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[place])   Relevant Medications   Capsaicin-Menthol-Methyl Sal (CAPSAICIN-METHYL SAL-MENTHOL) 0.025-1-12 % CREA Encouraged to take previously prescribed gabapentin Informed to notify clinic if newly add gabapentin is ineffective, may still require referral neurology v pain management Discussed at length potential side effects of capsaicin, patient agreed with treatment plan     Genitourinary   Renal cell carcinoma ([place])     Hematopoietic and Hemostatic   Coagulation defect, unspecified ([place])   Other Visit Diagnoses     Peritoneal dialysis catheter in place ([place])   (Chronic)     Human immunodeficiency virus (HIV) disease ([place])   (Chronic)     Atrial tachycardia (HCC)   (Chronic)     Angina pectoris, unspecified ([place])   (Chronic)     Follow up in 3 mths for reevaluation of symptoms and wellness visit, sooner as needed    I am having Albion Weatherholtz. Hutto start on  capsaicin-methyl sal-menthol. I am also having him maintain his amLODipine, Spacer/Aero-Holding Chambers, lidocaine-prilocaine, hyoscyamine, mupirocin ointment, [place], lactulose, ipratropium, gabapentin, sevelamer carbonate, Methoxy PEG-Epoetin Beta (MIRCERA IJ), clonazePAM, levothyroxine, Vitamin D (Ergocalciferol), calcitRIOL, methocarbamol, chlorproMAZINE, Acetaminophen Extra Strength, multivitamin, docusate sodium, senna, Venofer, aspirin EC, hydrALAZINE, clotrimazole-betamethasone, azelastine, gentamicin cream, Flovent HFA, Biktarvy, pantoprazole, ProAir HFA, rOPINIRole, clobetasol, lubiprostone, allopurinol, Sensipar, rosuvastatin, metoprolol tartrate, famotidine, Auryxia, and Methoxy PEG-Epoetin Beta (MIRCERA IJ).  Meds ordered this encounter  Medications   Capsaicin-Menthol-Methyl Sal (CAPSAICIN-METHYL SAL-MENTHOL) 0.025-1-12 % CREA    Sig: Apply 1 application topically 4 (four) times daily as needed (nerve pain).    Dispense:  56.6 g    Refill:  0     Teena Dunk, NP

## 2021-11-12 ENCOUNTER — Ambulatory Visit (INDEPENDENT_AMBULATORY_CARE_PROVIDER_SITE_OTHER): Payer: Medicare Other | Admitting: Pulmonary Disease

## 2021-11-12 ENCOUNTER — Encounter (INDEPENDENT_AMBULATORY_CARE_PROVIDER_SITE_OTHER): Payer: Medicare Other

## 2021-11-12 ENCOUNTER — Encounter: Payer: Self-pay | Admitting: Pulmonary Disease

## 2021-11-12 VITALS — BP 130/80 | HR 70 | Temp 98.7°F | Ht 70.0 in | Wt 203.0 lb

## 2021-11-12 DIAGNOSIS — Z992 Dependence on renal dialysis: Secondary | ICD-10-CM | POA: Diagnosis not present

## 2021-11-12 DIAGNOSIS — N186 End stage renal disease: Secondary | ICD-10-CM | POA: Diagnosis not present

## 2021-11-12 DIAGNOSIS — N2581 Secondary hyperparathyroidism of renal origin: Secondary | ICD-10-CM | POA: Diagnosis not present

## 2021-11-12 DIAGNOSIS — Z4932 Encounter for adequacy testing for peritoneal dialysis: Secondary | ICD-10-CM | POA: Diagnosis not present

## 2021-11-12 DIAGNOSIS — R82998 Other abnormal findings in urine: Secondary | ICD-10-CM | POA: Diagnosis not present

## 2021-11-12 DIAGNOSIS — G4733 Obstructive sleep apnea (adult) (pediatric): Secondary | ICD-10-CM

## 2021-11-12 DIAGNOSIS — D509 Iron deficiency anemia, unspecified: Secondary | ICD-10-CM | POA: Diagnosis not present

## 2021-11-12 DIAGNOSIS — Z79899 Other long term (current) drug therapy: Secondary | ICD-10-CM | POA: Diagnosis not present

## 2021-11-12 DIAGNOSIS — D631 Anemia in chronic kidney disease: Secondary | ICD-10-CM | POA: Diagnosis not present

## 2021-11-12 NOTE — Patient Instructions (Signed)
We will contact the medical supply company to change her CPAP settings from 7-5  We will try and get the home sleep study done to confirm degree of sleep disordered breathing  Continue weight loss efforts  No medication changes  Follow-up in 3 months  Try and use the machine on a regular basis so that we can get some objective data to show if there is anything that we can do different with the pressure settings

## 2021-11-12 NOTE — Progress Notes (Signed)
Subjective:    Patient ID: Albert Eaton, male    DOB: [birth year], 56 y.o.   MRN: [id number]  Patient with a history of mild obstructive sleep apnea has been on CPAP  He continues to try to use CPAP Continues to have mask issues  Has really not been using the machine at all   continues to have insomnia, Klonopin started during last visit, helps a little bit  Significant issues with his restless legs, felt to be related to his neuropathy  Neurontin does help but Neurontin makes him very groggy throughout the day, is only been using it once a day instead of up to 3 times a day  He will be trying to use Neurontin 3 times a day to see whether he can tolerate the side effects  He continues to feel the pressure from the machine is too much and has not been able to tolerate it  He occasionally feels the pressure may be a little bit too much Usually tries to go to bed by about 10 PM, on dialysis days goes to bed between 6 and 7 PM Sometimes takes him up to 2 to 3 hours to fall asleep Wakes up frequently in the middle of the night Final awakening time about 9 AM  Multiple comorbidities History of heart disease, end-stage renal disease  He does have dryness of his mouth during the night, occasional headaches in the morning, memory has been fine  His weight is down about 20 pounds recently  He does get short of breath with activity    Past Medical History:  Diagnosis Date   Anemia    Aortic atherosclerosis (HCC)    Arthritis    Asthma    Cancer [facility])    renal cyst   Chronic diastolic CHF (congestive heart failure) ([place]) 01/06/2017   Echo 7/16 [facility] in [place], [name]) Mild AI, mild LAE, mild concentric LVH, EF 55, normal wall motion, mild to moderate MR, mild PI, RVSP 55 // Echo 10/09/16 ([facility]):  Moderate LVH, grade 2 diastolic dysfunction, mild MR, moderate LAE    CKD (chronic kidney disease)    Dialysis Mon Wed Fri   Esophagitis    GERD (gastroesophageal reflux  disease)    Gout    no current problems   Heart murmur    never caused any problems   Hepatitis    Hep B   History of nuclear stress test    a. Nuc study 7/16: no scar or ischemia, EF 42 // b. Nuc study 12/17: EF 48, ?small apical ischemia, Low Risk   HIV (human immunodeficiency virus infection) ([place])    HLD (hyperlipidemia)    Hypertension    Hypertensive heart disease with CHF (congestive heart failure) ([place]) 10/09/2016   Hypothyroidism    Mitral regurgitation    OSA (obstructive sleep apnea)    Post-operative nausea and vomiting    1 time as a child, no problems as an adult   Sleep apnea    uses c-pap   Thyroid disease    Wears glasses    Wears partial dentures    Family History  Problem Relation Age of Onset   Heart failure Mother    Heart attack Maternal Grandmother 5   Hypertension Sister    Multiple sclerosis Sister    Hypertension Brother    Hypertension Sister    Scoliosis Other    Allergic rhinitis Neg Hx    Angioedema Neg Hx    Asthma Neg  Hx    Eczema Neg Hx    Immunodeficiency Neg Hx    Urticaria Neg Hx    Colon cancer Neg Hx    Pancreatic cancer Neg Hx    Esophageal cancer Neg Hx    Social History   Socioeconomic History   Marital status: Single    Spouse name: Not on file   Number of children: Not on file   Years of education: Not on file   Highest education level: Not on file  Occupational History   Not on file  Tobacco Use   Smoking status: Former    Packs/day: 1.00    Types: Cigarettes    Quit date: 2000    Years since quitting: 23.0   Smokeless tobacco: Never  Vaping Use   Vaping Use: Never used  Substance and Sexual Activity   Alcohol use: Yes    Comment: occasionally liquor   Drug use: No   Sexual activity: Not Currently    Partners: Male    Birth control/protection: Condom    Comment: declined  condoms 07/11/20  Other Topics Concern   Not on file  Social History Narrative   Not on file   Social Determinants of Health    Financial Resource Strain: Not on file  Food Insecurity: Not on file  Transportation Needs: Not on file  Physical Activity: Not on file  Stress: Not on file  Social Connections: Not on file  Intimate Partner Violence: Not on file     Review of Systems  Constitutional: Negative.   HENT:  Positive for congestion. Negative for sneezing.   Eyes: Negative.   Respiratory:  Positive for apnea.   Cardiovascular: Negative.   Gastrointestinal: Negative.   Genitourinary: Negative.   Psychiatric/Behavioral:  Positive for sleep disturbance.   All other systems reviewed and are negative.     Objective:   Physical Exam Constitutional:      Appearance: Normal appearance.  HENT:     Nose: No congestion.  Eyes:     General:        Right eye: No discharge.        Left eye: No discharge.     Pupils: Pupils are equal, round, and reactive to light.  Cardiovascular:     Pulses: Normal pulses.     Heart sounds: Normal heart sounds. No murmur heard.   No friction rub.  Pulmonary:     Effort: Pulmonary effort is normal. No respiratory distress.     Breath sounds: Normal breath sounds. No stridor. No wheezing or rhonchi.  Musculoskeletal:     Cervical back: No rigidity.  Neurological:     Mental Status: He is alert.  Psychiatric:        Mood and Affect: Mood normal.  Polysomnogram from 12/24/2017 reviewed revealing mild obstructive sleep apnea  Compliance data not available Assessment & Plan:  .  Mild obstructive sleep apnea -Continues to find it difficult to adjust to CPAP use -We discussed about decreasing the pressure to 5 from 7  Sleep onset and sleep maintenance insomnia -Trial with Klonopin which should help his insomnia and also help his restless legs -Still has significant issues with his restless legs -This is felt to be related to his neuropathy which appears to be worsening  Restless leg -On Neurontin -On Requip -Neurontin does help but causes grogginess  He is on  peritoneal dialysis nightly, will be difficult for him to have an in lab study performed for his assessment of sleep apnea  We did talk about having a home sleep study repeated  Plan: No changes to his medications  Will attempt a home sleep study  Will make CPAP pressure changes-decrease from 7-5  I encouraged him to use the CPAP on a nightly basis so that we can get some data regarding tolerance of the pressures  Tentative follow-up in 3 months

## 2021-11-13 ENCOUNTER — Telehealth: Payer: Self-pay

## 2021-11-13 DIAGNOSIS — Z992 Dependence on renal dialysis: Secondary | ICD-10-CM | POA: Diagnosis not present

## 2021-11-13 DIAGNOSIS — N186 End stage renal disease: Secondary | ICD-10-CM | POA: Diagnosis not present

## 2021-11-13 DIAGNOSIS — Z4932 Encounter for adequacy testing for peritoneal dialysis: Secondary | ICD-10-CM | POA: Diagnosis not present

## 2021-11-13 DIAGNOSIS — R82998 Other abnormal findings in urine: Secondary | ICD-10-CM | POA: Diagnosis not present

## 2021-11-13 DIAGNOSIS — N2581 Secondary hyperparathyroidism of renal origin: Secondary | ICD-10-CM | POA: Diagnosis not present

## 2021-11-13 DIAGNOSIS — I340 Nonrheumatic mitral (valve) insufficiency: Secondary | ICD-10-CM

## 2021-11-13 DIAGNOSIS — D509 Iron deficiency anemia, unspecified: Secondary | ICD-10-CM | POA: Diagnosis not present

## 2021-11-13 DIAGNOSIS — Z79899 Other long term (current) drug therapy: Secondary | ICD-10-CM | POA: Diagnosis not present

## 2021-11-13 DIAGNOSIS — D631 Anemia in chronic kidney disease: Secondary | ICD-10-CM | POA: Diagnosis not present

## 2021-11-13 NOTE — Telephone Encounter (Signed)
Scheduled the patient for overdue echo and office visit with Dr. [name] on 12/20/2021. Albert Eaton was grateful for call and agrees with plan.

## 2021-11-14 ENCOUNTER — Other Ambulatory Visit: Payer: Self-pay | Admitting: Cardiovascular Disease

## 2021-11-14 DIAGNOSIS — N2581 Secondary hyperparathyroidism of renal origin: Secondary | ICD-10-CM | POA: Diagnosis not present

## 2021-11-14 DIAGNOSIS — Z4932 Encounter for adequacy testing for peritoneal dialysis: Secondary | ICD-10-CM | POA: Diagnosis not present

## 2021-11-14 DIAGNOSIS — R82998 Other abnormal findings in urine: Secondary | ICD-10-CM | POA: Diagnosis not present

## 2021-11-14 DIAGNOSIS — Z79899 Other long term (current) drug therapy: Secondary | ICD-10-CM | POA: Diagnosis not present

## 2021-11-14 DIAGNOSIS — Z992 Dependence on renal dialysis: Secondary | ICD-10-CM | POA: Diagnosis not present

## 2021-11-14 DIAGNOSIS — D631 Anemia in chronic kidney disease: Secondary | ICD-10-CM | POA: Diagnosis not present

## 2021-11-14 DIAGNOSIS — N186 End stage renal disease: Secondary | ICD-10-CM | POA: Diagnosis not present

## 2021-11-14 DIAGNOSIS — D509 Iron deficiency anemia, unspecified: Secondary | ICD-10-CM | POA: Diagnosis not present

## 2021-11-15 DIAGNOSIS — D509 Iron deficiency anemia, unspecified: Secondary | ICD-10-CM | POA: Diagnosis not present

## 2021-11-15 DIAGNOSIS — Z79899 Other long term (current) drug therapy: Secondary | ICD-10-CM | POA: Diagnosis not present

## 2021-11-15 DIAGNOSIS — N186 End stage renal disease: Secondary | ICD-10-CM | POA: Diagnosis not present

## 2021-11-15 DIAGNOSIS — D631 Anemia in chronic kidney disease: Secondary | ICD-10-CM | POA: Diagnosis not present

## 2021-11-15 DIAGNOSIS — N2581 Secondary hyperparathyroidism of renal origin: Secondary | ICD-10-CM | POA: Diagnosis not present

## 2021-11-15 DIAGNOSIS — Z992 Dependence on renal dialysis: Secondary | ICD-10-CM | POA: Diagnosis not present

## 2021-11-15 DIAGNOSIS — Z4932 Encounter for adequacy testing for peritoneal dialysis: Secondary | ICD-10-CM | POA: Diagnosis not present

## 2021-11-15 DIAGNOSIS — R82998 Other abnormal findings in urine: Secondary | ICD-10-CM | POA: Diagnosis not present

## 2021-11-16 DIAGNOSIS — Z4932 Encounter for adequacy testing for peritoneal dialysis: Secondary | ICD-10-CM | POA: Diagnosis not present

## 2021-11-16 DIAGNOSIS — Z79899 Other long term (current) drug therapy: Secondary | ICD-10-CM | POA: Diagnosis not present

## 2021-11-16 DIAGNOSIS — R82998 Other abnormal findings in urine: Secondary | ICD-10-CM | POA: Diagnosis not present

## 2021-11-16 DIAGNOSIS — Z992 Dependence on renal dialysis: Secondary | ICD-10-CM | POA: Diagnosis not present

## 2021-11-16 DIAGNOSIS — D509 Iron deficiency anemia, unspecified: Secondary | ICD-10-CM | POA: Diagnosis not present

## 2021-11-16 DIAGNOSIS — N186 End stage renal disease: Secondary | ICD-10-CM | POA: Diagnosis not present

## 2021-11-16 DIAGNOSIS — N2581 Secondary hyperparathyroidism of renal origin: Secondary | ICD-10-CM | POA: Diagnosis not present

## 2021-11-16 DIAGNOSIS — D631 Anemia in chronic kidney disease: Secondary | ICD-10-CM | POA: Diagnosis not present

## 2021-11-17 DIAGNOSIS — Z79899 Other long term (current) drug therapy: Secondary | ICD-10-CM | POA: Diagnosis not present

## 2021-11-17 DIAGNOSIS — N186 End stage renal disease: Secondary | ICD-10-CM | POA: Diagnosis not present

## 2021-11-17 DIAGNOSIS — D631 Anemia in chronic kidney disease: Secondary | ICD-10-CM | POA: Diagnosis not present

## 2021-11-17 DIAGNOSIS — D509 Iron deficiency anemia, unspecified: Secondary | ICD-10-CM | POA: Diagnosis not present

## 2021-11-17 DIAGNOSIS — R82998 Other abnormal findings in urine: Secondary | ICD-10-CM | POA: Diagnosis not present

## 2021-11-17 DIAGNOSIS — N2581 Secondary hyperparathyroidism of renal origin: Secondary | ICD-10-CM | POA: Diagnosis not present

## 2021-11-17 DIAGNOSIS — Z992 Dependence on renal dialysis: Secondary | ICD-10-CM | POA: Diagnosis not present

## 2021-11-17 DIAGNOSIS — Z4932 Encounter for adequacy testing for peritoneal dialysis: Secondary | ICD-10-CM | POA: Diagnosis not present

## 2021-11-18 ENCOUNTER — Telehealth: Payer: Self-pay | Admitting: Pulmonary Disease

## 2021-11-18 DIAGNOSIS — Z79899 Other long term (current) drug therapy: Secondary | ICD-10-CM | POA: Diagnosis not present

## 2021-11-18 DIAGNOSIS — D509 Iron deficiency anemia, unspecified: Secondary | ICD-10-CM | POA: Diagnosis not present

## 2021-11-18 DIAGNOSIS — N186 End stage renal disease: Secondary | ICD-10-CM | POA: Diagnosis not present

## 2021-11-18 DIAGNOSIS — G4733 Obstructive sleep apnea (adult) (pediatric): Secondary | ICD-10-CM | POA: Diagnosis not present

## 2021-11-18 DIAGNOSIS — N2581 Secondary hyperparathyroidism of renal origin: Secondary | ICD-10-CM | POA: Diagnosis not present

## 2021-11-18 DIAGNOSIS — Z992 Dependence on renal dialysis: Secondary | ICD-10-CM | POA: Diagnosis not present

## 2021-11-18 DIAGNOSIS — D631 Anemia in chronic kidney disease: Secondary | ICD-10-CM | POA: Diagnosis not present

## 2021-11-18 DIAGNOSIS — Z4932 Encounter for adequacy testing for peritoneal dialysis: Secondary | ICD-10-CM | POA: Diagnosis not present

## 2021-11-18 DIAGNOSIS — R82998 Other abnormal findings in urine: Secondary | ICD-10-CM | POA: Diagnosis not present

## 2021-11-18 NOTE — Telephone Encounter (Signed)
Call patient  Sleep study result  Date of study: 11/12/2021  Impression: Moderate obstructive sleep apnea Moderately severe oxygen desaturations  Recommendation:  Follow-up for further discussions regarding options of care  CPAP therapy will most appropriate to resolve oxygen desaturations and sleep apnea -An in lab study for titration should be considered as patient was not tolerating low CPAP pressures-was recently on a CPAP pressure of 7  Other modes of care may also be considered including an inspire device or surgical interventions

## 2021-11-19 DIAGNOSIS — D509 Iron deficiency anemia, unspecified: Secondary | ICD-10-CM | POA: Diagnosis not present

## 2021-11-19 DIAGNOSIS — Z992 Dependence on renal dialysis: Secondary | ICD-10-CM | POA: Diagnosis not present

## 2021-11-19 DIAGNOSIS — D631 Anemia in chronic kidney disease: Secondary | ICD-10-CM | POA: Diagnosis not present

## 2021-11-19 DIAGNOSIS — Z79899 Other long term (current) drug therapy: Secondary | ICD-10-CM | POA: Diagnosis not present

## 2021-11-19 DIAGNOSIS — R82998 Other abnormal findings in urine: Secondary | ICD-10-CM | POA: Diagnosis not present

## 2021-11-19 DIAGNOSIS — N186 End stage renal disease: Secondary | ICD-10-CM | POA: Diagnosis not present

## 2021-11-19 DIAGNOSIS — N2581 Secondary hyperparathyroidism of renal origin: Secondary | ICD-10-CM | POA: Diagnosis not present

## 2021-11-19 DIAGNOSIS — Z4932 Encounter for adequacy testing for peritoneal dialysis: Secondary | ICD-10-CM | POA: Diagnosis not present

## 2021-11-20 DIAGNOSIS — D631 Anemia in chronic kidney disease: Secondary | ICD-10-CM | POA: Diagnosis not present

## 2021-11-20 DIAGNOSIS — Z4932 Encounter for adequacy testing for peritoneal dialysis: Secondary | ICD-10-CM | POA: Diagnosis not present

## 2021-11-20 DIAGNOSIS — R82998 Other abnormal findings in urine: Secondary | ICD-10-CM | POA: Diagnosis not present

## 2021-11-20 DIAGNOSIS — Z79899 Other long term (current) drug therapy: Secondary | ICD-10-CM | POA: Diagnosis not present

## 2021-11-20 DIAGNOSIS — N186 End stage renal disease: Secondary | ICD-10-CM | POA: Diagnosis not present

## 2021-11-20 DIAGNOSIS — Z992 Dependence on renal dialysis: Secondary | ICD-10-CM | POA: Diagnosis not present

## 2021-11-20 DIAGNOSIS — N2581 Secondary hyperparathyroidism of renal origin: Secondary | ICD-10-CM | POA: Diagnosis not present

## 2021-11-20 DIAGNOSIS — D509 Iron deficiency anemia, unspecified: Secondary | ICD-10-CM | POA: Diagnosis not present

## 2021-11-20 NOTE — Telephone Encounter (Signed)
I left a message for the patient to call back 

## 2021-11-21 DIAGNOSIS — Z79899 Other long term (current) drug therapy: Secondary | ICD-10-CM | POA: Diagnosis not present

## 2021-11-21 DIAGNOSIS — R82998 Other abnormal findings in urine: Secondary | ICD-10-CM | POA: Diagnosis not present

## 2021-11-21 DIAGNOSIS — Z4932 Encounter for adequacy testing for peritoneal dialysis: Secondary | ICD-10-CM | POA: Diagnosis not present

## 2021-11-21 DIAGNOSIS — N2581 Secondary hyperparathyroidism of renal origin: Secondary | ICD-10-CM | POA: Diagnosis not present

## 2021-11-21 DIAGNOSIS — D631 Anemia in chronic kidney disease: Secondary | ICD-10-CM | POA: Diagnosis not present

## 2021-11-21 DIAGNOSIS — D509 Iron deficiency anemia, unspecified: Secondary | ICD-10-CM | POA: Diagnosis not present

## 2021-11-21 DIAGNOSIS — N186 End stage renal disease: Secondary | ICD-10-CM | POA: Diagnosis not present

## 2021-11-21 DIAGNOSIS — Z992 Dependence on renal dialysis: Secondary | ICD-10-CM | POA: Diagnosis not present

## 2021-11-22 DIAGNOSIS — Z79899 Other long term (current) drug therapy: Secondary | ICD-10-CM | POA: Diagnosis not present

## 2021-11-22 DIAGNOSIS — R82998 Other abnormal findings in urine: Secondary | ICD-10-CM | POA: Diagnosis not present

## 2021-11-22 DIAGNOSIS — Z992 Dependence on renal dialysis: Secondary | ICD-10-CM | POA: Diagnosis not present

## 2021-11-22 DIAGNOSIS — N2581 Secondary hyperparathyroidism of renal origin: Secondary | ICD-10-CM | POA: Diagnosis not present

## 2021-11-22 DIAGNOSIS — D631 Anemia in chronic kidney disease: Secondary | ICD-10-CM | POA: Diagnosis not present

## 2021-11-22 DIAGNOSIS — D509 Iron deficiency anemia, unspecified: Secondary | ICD-10-CM | POA: Diagnosis not present

## 2021-11-22 DIAGNOSIS — N186 End stage renal disease: Secondary | ICD-10-CM | POA: Diagnosis not present

## 2021-11-22 DIAGNOSIS — Z4932 Encounter for adequacy testing for peritoneal dialysis: Secondary | ICD-10-CM | POA: Diagnosis not present

## 2021-11-23 DIAGNOSIS — N2581 Secondary hyperparathyroidism of renal origin: Secondary | ICD-10-CM | POA: Diagnosis not present

## 2021-11-23 DIAGNOSIS — Z4932 Encounter for adequacy testing for peritoneal dialysis: Secondary | ICD-10-CM | POA: Diagnosis not present

## 2021-11-23 DIAGNOSIS — Z79899 Other long term (current) drug therapy: Secondary | ICD-10-CM | POA: Diagnosis not present

## 2021-11-23 DIAGNOSIS — R82998 Other abnormal findings in urine: Secondary | ICD-10-CM | POA: Diagnosis not present

## 2021-11-23 DIAGNOSIS — D631 Anemia in chronic kidney disease: Secondary | ICD-10-CM | POA: Diagnosis not present

## 2021-11-23 DIAGNOSIS — D509 Iron deficiency anemia, unspecified: Secondary | ICD-10-CM | POA: Diagnosis not present

## 2021-11-23 DIAGNOSIS — Z992 Dependence on renal dialysis: Secondary | ICD-10-CM | POA: Diagnosis not present

## 2021-11-23 DIAGNOSIS — N186 End stage renal disease: Secondary | ICD-10-CM | POA: Diagnosis not present

## 2021-11-24 DIAGNOSIS — Z79899 Other long term (current) drug therapy: Secondary | ICD-10-CM | POA: Diagnosis not present

## 2021-11-24 DIAGNOSIS — Z992 Dependence on renal dialysis: Secondary | ICD-10-CM | POA: Diagnosis not present

## 2021-11-24 DIAGNOSIS — Z4932 Encounter for adequacy testing for peritoneal dialysis: Secondary | ICD-10-CM | POA: Diagnosis not present

## 2021-11-24 DIAGNOSIS — N2581 Secondary hyperparathyroidism of renal origin: Secondary | ICD-10-CM | POA: Diagnosis not present

## 2021-11-24 DIAGNOSIS — D509 Iron deficiency anemia, unspecified: Secondary | ICD-10-CM | POA: Diagnosis not present

## 2021-11-24 DIAGNOSIS — N186 End stage renal disease: Secondary | ICD-10-CM | POA: Diagnosis not present

## 2021-11-24 DIAGNOSIS — D631 Anemia in chronic kidney disease: Secondary | ICD-10-CM | POA: Diagnosis not present

## 2021-11-24 DIAGNOSIS — R82998 Other abnormal findings in urine: Secondary | ICD-10-CM | POA: Diagnosis not present

## 2021-11-25 DIAGNOSIS — Z4932 Encounter for adequacy testing for peritoneal dialysis: Secondary | ICD-10-CM | POA: Diagnosis not present

## 2021-11-25 DIAGNOSIS — N186 End stage renal disease: Secondary | ICD-10-CM | POA: Diagnosis not present

## 2021-11-25 DIAGNOSIS — Z79899 Other long term (current) drug therapy: Secondary | ICD-10-CM | POA: Diagnosis not present

## 2021-11-25 DIAGNOSIS — N2581 Secondary hyperparathyroidism of renal origin: Secondary | ICD-10-CM | POA: Diagnosis not present

## 2021-11-25 DIAGNOSIS — R82998 Other abnormal findings in urine: Secondary | ICD-10-CM | POA: Diagnosis not present

## 2021-11-25 DIAGNOSIS — D631 Anemia in chronic kidney disease: Secondary | ICD-10-CM | POA: Diagnosis not present

## 2021-11-25 DIAGNOSIS — D509 Iron deficiency anemia, unspecified: Secondary | ICD-10-CM | POA: Diagnosis not present

## 2021-11-25 DIAGNOSIS — Z992 Dependence on renal dialysis: Secondary | ICD-10-CM | POA: Diagnosis not present

## 2021-11-26 DIAGNOSIS — Z992 Dependence on renal dialysis: Secondary | ICD-10-CM | POA: Diagnosis not present

## 2021-11-26 DIAGNOSIS — D509 Iron deficiency anemia, unspecified: Secondary | ICD-10-CM | POA: Diagnosis not present

## 2021-11-26 DIAGNOSIS — I129 Hypertensive chronic kidney disease with stage 1 through stage 4 chronic kidney disease, or unspecified chronic kidney disease: Secondary | ICD-10-CM | POA: Diagnosis not present

## 2021-11-26 DIAGNOSIS — Z4932 Encounter for adequacy testing for peritoneal dialysis: Secondary | ICD-10-CM | POA: Diagnosis not present

## 2021-11-26 DIAGNOSIS — R82998 Other abnormal findings in urine: Secondary | ICD-10-CM | POA: Diagnosis not present

## 2021-11-26 DIAGNOSIS — N2581 Secondary hyperparathyroidism of renal origin: Secondary | ICD-10-CM | POA: Diagnosis not present

## 2021-11-26 DIAGNOSIS — N186 End stage renal disease: Secondary | ICD-10-CM | POA: Diagnosis not present

## 2021-11-26 DIAGNOSIS — Z79899 Other long term (current) drug therapy: Secondary | ICD-10-CM | POA: Diagnosis not present

## 2021-11-26 DIAGNOSIS — D631 Anemia in chronic kidney disease: Secondary | ICD-10-CM | POA: Diagnosis not present

## 2021-11-27 DIAGNOSIS — N2581 Secondary hyperparathyroidism of renal origin: Secondary | ICD-10-CM | POA: Diagnosis not present

## 2021-11-27 DIAGNOSIS — Z992 Dependence on renal dialysis: Secondary | ICD-10-CM | POA: Diagnosis not present

## 2021-11-27 DIAGNOSIS — R82998 Other abnormal findings in urine: Secondary | ICD-10-CM | POA: Diagnosis not present

## 2021-11-27 DIAGNOSIS — D509 Iron deficiency anemia, unspecified: Secondary | ICD-10-CM | POA: Diagnosis not present

## 2021-11-27 DIAGNOSIS — D631 Anemia in chronic kidney disease: Secondary | ICD-10-CM | POA: Diagnosis not present

## 2021-11-27 DIAGNOSIS — Z4932 Encounter for adequacy testing for peritoneal dialysis: Secondary | ICD-10-CM | POA: Diagnosis not present

## 2021-11-27 DIAGNOSIS — Z79899 Other long term (current) drug therapy: Secondary | ICD-10-CM | POA: Diagnosis not present

## 2021-11-27 DIAGNOSIS — N186 End stage renal disease: Secondary | ICD-10-CM | POA: Diagnosis not present

## 2021-11-28 DIAGNOSIS — Z992 Dependence on renal dialysis: Secondary | ICD-10-CM | POA: Diagnosis not present

## 2021-11-28 DIAGNOSIS — Z79899 Other long term (current) drug therapy: Secondary | ICD-10-CM | POA: Diagnosis not present

## 2021-11-28 DIAGNOSIS — N2581 Secondary hyperparathyroidism of renal origin: Secondary | ICD-10-CM | POA: Diagnosis not present

## 2021-11-28 DIAGNOSIS — Z4932 Encounter for adequacy testing for peritoneal dialysis: Secondary | ICD-10-CM | POA: Diagnosis not present

## 2021-11-28 DIAGNOSIS — N186 End stage renal disease: Secondary | ICD-10-CM | POA: Diagnosis not present

## 2021-11-28 DIAGNOSIS — D631 Anemia in chronic kidney disease: Secondary | ICD-10-CM | POA: Diagnosis not present

## 2021-11-28 DIAGNOSIS — D509 Iron deficiency anemia, unspecified: Secondary | ICD-10-CM | POA: Diagnosis not present

## 2021-11-28 DIAGNOSIS — R82998 Other abnormal findings in urine: Secondary | ICD-10-CM | POA: Diagnosis not present

## 2021-11-28 NOTE — Telephone Encounter (Signed)
I called the patient and I was not able to leave a message. The patient has a follow up with Dr. Jenetta Downer and he will discuss the options at that office visit.

## 2021-11-29 DIAGNOSIS — Z4932 Encounter for adequacy testing for peritoneal dialysis: Secondary | ICD-10-CM | POA: Diagnosis not present

## 2021-11-29 DIAGNOSIS — Z79899 Other long term (current) drug therapy: Secondary | ICD-10-CM | POA: Diagnosis not present

## 2021-11-29 DIAGNOSIS — N2581 Secondary hyperparathyroidism of renal origin: Secondary | ICD-10-CM | POA: Diagnosis not present

## 2021-11-29 DIAGNOSIS — D631 Anemia in chronic kidney disease: Secondary | ICD-10-CM | POA: Diagnosis not present

## 2021-11-29 DIAGNOSIS — N186 End stage renal disease: Secondary | ICD-10-CM | POA: Diagnosis not present

## 2021-11-29 DIAGNOSIS — Z992 Dependence on renal dialysis: Secondary | ICD-10-CM | POA: Diagnosis not present

## 2021-11-29 DIAGNOSIS — R82998 Other abnormal findings in urine: Secondary | ICD-10-CM | POA: Diagnosis not present

## 2021-11-29 DIAGNOSIS — D509 Iron deficiency anemia, unspecified: Secondary | ICD-10-CM | POA: Diagnosis not present

## 2021-11-30 DIAGNOSIS — D509 Iron deficiency anemia, unspecified: Secondary | ICD-10-CM | POA: Diagnosis not present

## 2021-11-30 DIAGNOSIS — Z992 Dependence on renal dialysis: Secondary | ICD-10-CM | POA: Diagnosis not present

## 2021-11-30 DIAGNOSIS — D631 Anemia in chronic kidney disease: Secondary | ICD-10-CM | POA: Diagnosis not present

## 2021-11-30 DIAGNOSIS — Z4932 Encounter for adequacy testing for peritoneal dialysis: Secondary | ICD-10-CM | POA: Diagnosis not present

## 2021-11-30 DIAGNOSIS — Z79899 Other long term (current) drug therapy: Secondary | ICD-10-CM | POA: Diagnosis not present

## 2021-11-30 DIAGNOSIS — N186 End stage renal disease: Secondary | ICD-10-CM | POA: Diagnosis not present

## 2021-11-30 DIAGNOSIS — R82998 Other abnormal findings in urine: Secondary | ICD-10-CM | POA: Diagnosis not present

## 2021-11-30 DIAGNOSIS — N2581 Secondary hyperparathyroidism of renal origin: Secondary | ICD-10-CM | POA: Diagnosis not present

## 2021-12-01 DIAGNOSIS — Z992 Dependence on renal dialysis: Secondary | ICD-10-CM | POA: Diagnosis not present

## 2021-12-01 DIAGNOSIS — D631 Anemia in chronic kidney disease: Secondary | ICD-10-CM | POA: Diagnosis not present

## 2021-12-01 DIAGNOSIS — N186 End stage renal disease: Secondary | ICD-10-CM | POA: Diagnosis not present

## 2021-12-01 DIAGNOSIS — N2581 Secondary hyperparathyroidism of renal origin: Secondary | ICD-10-CM | POA: Diagnosis not present

## 2021-12-01 DIAGNOSIS — R82998 Other abnormal findings in urine: Secondary | ICD-10-CM | POA: Diagnosis not present

## 2021-12-01 DIAGNOSIS — Z4932 Encounter for adequacy testing for peritoneal dialysis: Secondary | ICD-10-CM | POA: Diagnosis not present

## 2021-12-01 DIAGNOSIS — Z79899 Other long term (current) drug therapy: Secondary | ICD-10-CM | POA: Diagnosis not present

## 2021-12-01 DIAGNOSIS — D509 Iron deficiency anemia, unspecified: Secondary | ICD-10-CM | POA: Diagnosis not present

## 2021-12-02 ENCOUNTER — Emergency Department (HOSPITAL_COMMUNITY): Payer: Medicare Other

## 2021-12-02 ENCOUNTER — Other Ambulatory Visit: Payer: Self-pay

## 2021-12-02 ENCOUNTER — Inpatient Hospital Stay (HOSPITAL_COMMUNITY)
Admission: EM | Admit: 2021-12-02 | Discharge: 2021-12-04 | DRG: 640 | Discharge status: Against Medical Advice | Payer: Medicare Other | Attending: Internal Medicine | Admitting: Internal Medicine

## 2021-12-02 DIAGNOSIS — Z888 Allergy status to other drugs, medicaments and biological substances status: Secondary | ICD-10-CM

## 2021-12-02 DIAGNOSIS — I4891 Unspecified atrial fibrillation: Secondary | ICD-10-CM | POA: Diagnosis not present

## 2021-12-02 DIAGNOSIS — I480 Paroxysmal atrial fibrillation: Secondary | ICD-10-CM

## 2021-12-02 DIAGNOSIS — Z79890 Hormone replacement therapy (postmenopausal): Secondary | ICD-10-CM

## 2021-12-02 DIAGNOSIS — N269 Renal sclerosis, unspecified: Secondary | ICD-10-CM | POA: Diagnosis present

## 2021-12-02 DIAGNOSIS — R82998 Other abnormal findings in urine: Secondary | ICD-10-CM | POA: Diagnosis not present

## 2021-12-02 DIAGNOSIS — R531 Weakness: Secondary | ICD-10-CM | POA: Diagnosis not present

## 2021-12-02 DIAGNOSIS — Z5329 Procedure and treatment not carried out because of patient's decision for other reasons: Secondary | ICD-10-CM | POA: Diagnosis not present

## 2021-12-02 DIAGNOSIS — Z79899 Other long term (current) drug therapy: Secondary | ICD-10-CM

## 2021-12-02 DIAGNOSIS — Z20822 Contact with and (suspected) exposure to covid-19: Secondary | ICD-10-CM | POA: Diagnosis not present

## 2021-12-02 DIAGNOSIS — Z905 Acquired absence of kidney: Secondary | ICD-10-CM

## 2021-12-02 DIAGNOSIS — I517 Cardiomegaly: Secondary | ICD-10-CM | POA: Diagnosis not present

## 2021-12-02 DIAGNOSIS — R404 Transient alteration of awareness: Secondary | ICD-10-CM | POA: Diagnosis not present

## 2021-12-02 DIAGNOSIS — G4733 Obstructive sleep apnea (adult) (pediatric): Secondary | ICD-10-CM | POA: Diagnosis present

## 2021-12-02 DIAGNOSIS — I951 Orthostatic hypotension: Secondary | ICD-10-CM | POA: Diagnosis not present

## 2021-12-02 DIAGNOSIS — Z21 Asymptomatic human immunodeficiency virus [HIV] infection status: Secondary | ICD-10-CM | POA: Diagnosis present

## 2021-12-02 DIAGNOSIS — M1990 Unspecified osteoarthritis, unspecified site: Secondary | ICD-10-CM | POA: Diagnosis present

## 2021-12-02 DIAGNOSIS — J45909 Unspecified asthma, uncomplicated: Secondary | ICD-10-CM | POA: Diagnosis present

## 2021-12-02 DIAGNOSIS — E039 Hypothyroidism, unspecified: Secondary | ICD-10-CM | POA: Diagnosis present

## 2021-12-02 DIAGNOSIS — I132 Hypertensive heart and chronic kidney disease with heart failure and with stage 5 chronic kidney disease, or end stage renal disease: Secondary | ICD-10-CM | POA: Diagnosis present

## 2021-12-02 DIAGNOSIS — D631 Anemia in chronic kidney disease: Secondary | ICD-10-CM | POA: Diagnosis not present

## 2021-12-02 DIAGNOSIS — Z992 Dependence on renal dialysis: Secondary | ICD-10-CM

## 2021-12-02 DIAGNOSIS — R112 Nausea with vomiting, unspecified: Secondary | ICD-10-CM | POA: Diagnosis present

## 2021-12-02 DIAGNOSIS — E876 Hypokalemia: Principal | ICD-10-CM

## 2021-12-02 DIAGNOSIS — E860 Dehydration: Secondary | ICD-10-CM | POA: Diagnosis present

## 2021-12-02 DIAGNOSIS — B1910 Unspecified viral hepatitis B without hepatic coma: Secondary | ICD-10-CM | POA: Diagnosis present

## 2021-12-02 DIAGNOSIS — M1A9XX0 Chronic gout, unspecified, without tophus (tophi): Secondary | ICD-10-CM | POA: Diagnosis not present

## 2021-12-02 DIAGNOSIS — N186 End stage renal disease: Secondary | ICD-10-CM | POA: Diagnosis not present

## 2021-12-02 DIAGNOSIS — R6889 Other general symptoms and signs: Secondary | ICD-10-CM | POA: Diagnosis not present

## 2021-12-02 DIAGNOSIS — I499 Cardiac arrhythmia, unspecified: Secondary | ICD-10-CM | POA: Diagnosis not present

## 2021-12-02 DIAGNOSIS — D509 Iron deficiency anemia, unspecified: Secondary | ICD-10-CM | POA: Diagnosis not present

## 2021-12-02 DIAGNOSIS — Z7982 Long term (current) use of aspirin: Secondary | ICD-10-CM

## 2021-12-02 DIAGNOSIS — R0902 Hypoxemia: Secondary | ICD-10-CM | POA: Diagnosis not present

## 2021-12-02 DIAGNOSIS — I2510 Atherosclerotic heart disease of native coronary artery without angina pectoris: Secondary | ICD-10-CM | POA: Diagnosis not present

## 2021-12-02 DIAGNOSIS — I5030 Unspecified diastolic (congestive) heart failure: Secondary | ICD-10-CM | POA: Diagnosis not present

## 2021-12-02 DIAGNOSIS — I5032 Chronic diastolic (congestive) heart failure: Secondary | ICD-10-CM | POA: Diagnosis present

## 2021-12-02 DIAGNOSIS — Z743 Need for continuous supervision: Secondary | ICD-10-CM | POA: Diagnosis not present

## 2021-12-02 DIAGNOSIS — Z8249 Family history of ischemic heart disease and other diseases of the circulatory system: Secondary | ICD-10-CM

## 2021-12-02 DIAGNOSIS — I471 Supraventricular tachycardia: Secondary | ICD-10-CM | POA: Diagnosis not present

## 2021-12-02 DIAGNOSIS — N2581 Secondary hyperparathyroidism of renal origin: Secondary | ICD-10-CM | POA: Diagnosis not present

## 2021-12-02 DIAGNOSIS — R630 Anorexia: Secondary | ICD-10-CM | POA: Diagnosis present

## 2021-12-02 DIAGNOSIS — I700 Atherosclerosis of aorta: Secondary | ICD-10-CM | POA: Diagnosis present

## 2021-12-02 DIAGNOSIS — Z87891 Personal history of nicotine dependence: Secondary | ICD-10-CM

## 2021-12-02 DIAGNOSIS — I120 Hypertensive chronic kidney disease with stage 5 chronic kidney disease or end stage renal disease: Secondary | ICD-10-CM | POA: Diagnosis not present

## 2021-12-02 DIAGNOSIS — E785 Hyperlipidemia, unspecified: Secondary | ICD-10-CM | POA: Diagnosis present

## 2021-12-02 DIAGNOSIS — Z4932 Encounter for adequacy testing for peritoneal dialysis: Secondary | ICD-10-CM | POA: Diagnosis not present

## 2021-12-02 DIAGNOSIS — R0602 Shortness of breath: Secondary | ICD-10-CM

## 2021-12-02 DIAGNOSIS — I110 Hypertensive heart disease with heart failure: Secondary | ICD-10-CM | POA: Diagnosis present

## 2021-12-02 DIAGNOSIS — K219 Gastro-esophageal reflux disease without esophagitis: Secondary | ICD-10-CM | POA: Diagnosis not present

## 2021-12-02 DIAGNOSIS — G629 Polyneuropathy, unspecified: Secondary | ICD-10-CM | POA: Diagnosis present

## 2021-12-02 DIAGNOSIS — Z820 Family history of epilepsy and other diseases of the nervous system: Secondary | ICD-10-CM

## 2021-12-02 LAB — CBC
HCT: 37.2 % — ABNORMAL LOW (ref 39.0–52.0)
Hemoglobin: 13.6 g/dL (ref 13.0–17.0)
MCH: 35.1 pg — ABNORMAL HIGH (ref 26.0–34.0)
MCHC: 36.6 g/dL — ABNORMAL HIGH (ref 30.0–36.0)
MCV: 95.9 fL (ref 80.0–100.0)
Platelets: 359 10*3/uL (ref 150–400)
RBC: 3.88 MIL/uL — ABNORMAL LOW (ref 4.22–5.81)
RDW: 13.7 % (ref 11.5–15.5)
WBC: 9.2 10*3/uL (ref 4.0–10.5)
nRBC: 0.3 % — ABNORMAL HIGH (ref 0.0–0.2)

## 2021-12-02 LAB — RESP PANEL BY RT-PCR (FLU A&B, COVID) ARPGX2
Influenza A by PCR: NEGATIVE
Influenza B by PCR: NEGATIVE
SARS Coronavirus 2 by RT PCR: NEGATIVE

## 2021-12-02 LAB — BASIC METABOLIC PANEL
Anion gap: 26 — ABNORMAL HIGH (ref 5–15)
BUN: 53 mg/dL — ABNORMAL HIGH (ref 6–20)
CO2: 19 mmol/L — ABNORMAL LOW (ref 22–32)
Calcium: 10.2 mg/dL (ref 8.9–10.3)
Chloride: 84 mmol/L — ABNORMAL LOW (ref 98–111)
Creatinine, Ser: 19.43 mg/dL — ABNORMAL HIGH (ref 0.61–1.24)
GFR, Estimated: 3 mL/min — ABNORMAL LOW (ref >60)
Glucose, Bld: 138 mg/dL — ABNORMAL HIGH (ref 70–99)
Potassium: 2.4 mmol/L — CL (ref 3.5–5.1)
Sodium: 129 mmol/L — ABNORMAL LOW (ref 135–145)

## 2021-12-02 LAB — MAGNESIUM: Magnesium: 1.6 mg/dL — ABNORMAL LOW (ref 1.7–2.4)

## 2021-12-02 LAB — HEPATITIS B SURFACE ANTIBODY,QUALITATIVE: Hep B S Ab: NONREACTIVE

## 2021-12-02 LAB — HEPATITIS B SURFACE ANTIGEN: Hepatitis B Surface Ag: REACTIVE — AB

## 2021-12-02 LAB — BRAIN NATRIURETIC PEPTIDE: B Natriuretic Peptide: 43.4 pg/mL (ref 0.0–100.0)

## 2021-12-02 LAB — PHOSPHORUS: Phosphorus: 7.1 mg/dL — ABNORMAL HIGH (ref 2.5–4.6)

## 2021-12-02 IMAGING — DX DG CHEST 1V PORT
1 series · 1 of 1 positions shown · non-contrast
Comparison: Chest x-ray [DATE].

CLINICAL DATA: 55-year-old male with history of shortness of
breath.

EXAM:
PORTABLE CHEST 1 VIEW

[chest]
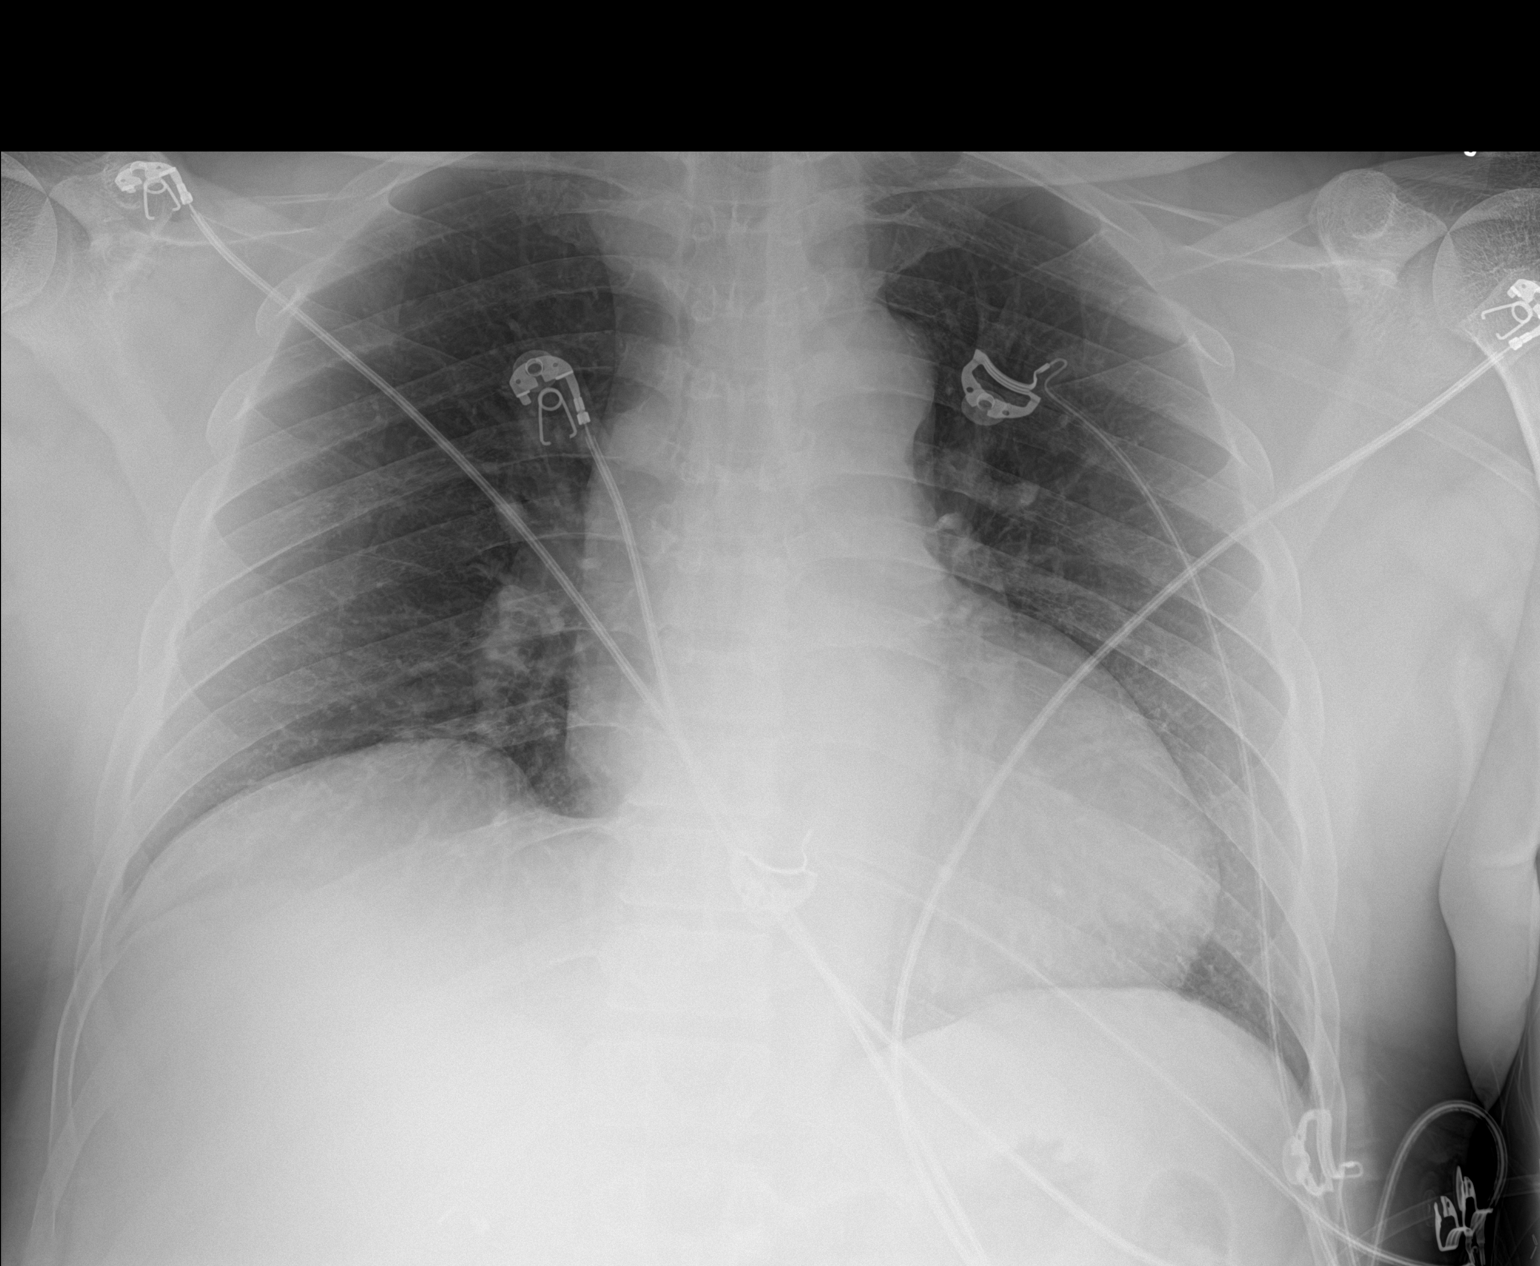

[1 of 1 positions shown; findings below may reference images not displayed]

FINDINGS: Elevation of the right hemidiaphragm, new compared to the prior
examination. Lung volumes are normal. No consolidative airspace
disease. No pleural effusions. No pneumothorax. No pulmonary nodule
or mass noted. Pulmonary vasculature is normal. Heart size is mildly
enlarged. Upper mediastinal contours are within normal limits.
IMPRESSION: 1. New elevation of the right hemidiaphragm is of uncertain etiology
and significance.
2. Mild cardiomegaly.

## 2021-12-02 MED ORDER — CINACALCET HCL 30 MG PO TABS
30.0000 mg | ORAL_TABLET | Freq: Every evening | ORAL | Status: DC
  Administered 2021-12-02 – 2021-12-03 (×2): 30 mg via ORAL
  Filled 2021-12-02 (×2): qty 1

## 2021-12-02 MED ORDER — BICTEGRAVIR-EMTRICITAB-TENOFOV 50-200-25 MG PO TABS
1.0000 | ORAL_TABLET | Freq: Every day | ORAL | Status: DC
  Administered 2021-12-03 – 2021-12-04 (×2): 1 via ORAL
  Filled 2021-12-02 (×4): qty 1

## 2021-12-02 MED ORDER — ALBUTEROL SULFATE (2.5 MG/3ML) 0.083% IN NEBU: 2.5000 mg | INHALATION_SOLUTION | RESPIRATORY_TRACT | Status: DC | PRN

## 2021-12-02 MED ORDER — APIXABAN 5 MG PO TABS
5.0000 mg | ORAL_TABLET | Freq: Once | ORAL | Status: AC
  Administered 2021-12-02: 5 mg via ORAL
  Filled 2021-12-02: qty 1

## 2021-12-02 MED ORDER — GABAPENTIN 300 MG PO CAPS
300.0000 mg | ORAL_CAPSULE | Freq: Every day | ORAL | Status: DC
  Administered 2021-12-02 – 2021-12-03 (×2): 300 mg via ORAL
  Filled 2021-12-02 (×2): qty 1

## 2021-12-02 MED ORDER — HEPARIN SODIUM (PORCINE) 5000 UNIT/ML IJ SOLN: 5000.0000 [IU] | Freq: Three times a day (TID) | INTRAMUSCULAR | Status: DC

## 2021-12-02 MED ORDER — POTASSIUM CHLORIDE CRYS ER 20 MEQ PO TBCR
60.0000 meq | EXTENDED_RELEASE_TABLET | Freq: Once | ORAL | Status: AC
  Administered 2021-12-02: 60 meq via ORAL
  Filled 2021-12-02: qty 3

## 2021-12-02 MED ORDER — CALCITRIOL 0.25 MCG PO CAPS
0.5000 ug | ORAL_CAPSULE | ORAL | Status: DC
  Administered 2021-12-04: 0.5 ug via ORAL
  Filled 2021-12-02: qty 2
  Filled 2021-12-02: qty 1

## 2021-12-02 MED ORDER — METOPROLOL TARTRATE 12.5 MG HALF TABLET
12.5000 mg | ORAL_TABLET | Freq: Two times a day (BID) | ORAL | Status: DC
  Administered 2021-12-02 – 2021-12-04 (×3): 12.5 mg via ORAL
  Filled 2021-12-02 (×3): qty 1

## 2021-12-02 MED ORDER — ACETAMINOPHEN 500 MG PO TABS: 500.0000 mg | ORAL_TABLET | Freq: Four times a day (QID) | ORAL | Status: DC | PRN

## 2021-12-02 MED ORDER — POTASSIUM CHLORIDE 10 MEQ/100ML IV SOLN
10.0000 meq | INTRAVENOUS | Status: AC
  Administered 2021-12-02 (×3): 10 meq via INTRAVENOUS
  Filled 2021-12-02 (×3): qty 100

## 2021-12-02 MED ORDER — DILTIAZEM LOAD VIA INFUSION
10.0000 mg | Freq: Once | INTRAVENOUS | Status: AC
  Administered 2021-12-02: 10 mg via INTRAVENOUS
  Filled 2021-12-02: qty 10

## 2021-12-02 MED ORDER — HEPARIN SODIUM (PORCINE) 1000 UNIT/ML DIALYSIS
2500.0000 [IU] | INTRAMUSCULAR | Status: DC | PRN
  Filled 2021-12-02 (×2): qty 3

## 2021-12-02 MED ORDER — LEVOTHYROXINE SODIUM 25 MCG PO TABS
25.0000 ug | ORAL_TABLET | Freq: Every day | ORAL | Status: DC
  Administered 2021-12-04: 25 ug via ORAL
  Filled 2021-12-02 (×2): qty 1

## 2021-12-02 MED ORDER — SODIUM CHLORIDE 0.9 % IV BOLUS
500.0000 mL | Freq: Once | INTRAVENOUS | Status: AC
Start: 2021-12-02 — End: 2021-12-02
  Administered 2021-12-02: 500 mL via INTRAVENOUS

## 2021-12-02 MED ORDER — ALLOPURINOL 100 MG PO TABS
100.0000 mg | ORAL_TABLET | Freq: Two times a day (BID) | ORAL | Status: DC
  Administered 2021-12-02 – 2021-12-04 (×4): 100 mg via ORAL
  Filled 2021-12-02 (×4): qty 1

## 2021-12-02 MED ORDER — ALUM & MAG HYDROXIDE-SIMETH 200-200-20 MG/5ML PO SUSP
30.0000 mL | Freq: Once | ORAL | Status: AC
Start: 2021-12-02 — End: 2021-12-02
  Administered 2021-12-02: 30 mL via ORAL
  Filled 2021-12-02: qty 30

## 2021-12-02 MED ORDER — FLUTICASONE PROPIONATE HFA 110 MCG/ACT IN AERO: 2.0000 | INHALATION_SPRAY | Freq: Two times a day (BID) | RESPIRATORY_TRACT | Status: DC

## 2021-12-02 MED ORDER — AMLODIPINE BESYLATE 10 MG PO TABS
10.0000 mg | ORAL_TABLET | Freq: Every day | ORAL | Status: DC
  Administered 2021-12-02: 10 mg via ORAL
  Filled 2021-12-02: qty 2
  Filled 2021-12-02: qty 1

## 2021-12-02 MED ORDER — LIDOCAINE VISCOUS HCL 2 % MT SOLN
15.0000 mL | Freq: Once | OROMUCOSAL | Status: AC
  Administered 2021-12-02: 15 mL via ORAL
  Filled 2021-12-02: qty 15

## 2021-12-02 MED ORDER — APIXABAN 5 MG PO TABS
5.0000 mg | ORAL_TABLET | Freq: Two times a day (BID) | ORAL | Status: DC
  Administered 2021-12-02 – 2021-12-04 (×4): 5 mg via ORAL
  Filled 2021-12-02 (×4): qty 1

## 2021-12-02 MED ORDER — ALUM & MAG HYDROXIDE-SIMETH 200-200-20 MG/5ML PO SUSP
30.0000 mL | Freq: Once | ORAL | Status: AC
  Administered 2021-12-02: 30 mL via ORAL
  Filled 2021-12-02: qty 30

## 2021-12-02 MED ORDER — CHLORHEXIDINE GLUCONATE CLOTH 2 % EX PADS
6.0000 | MEDICATED_PAD | Freq: Every day | CUTANEOUS | Status: DC
  Administered 2021-12-04: 6 via TOPICAL

## 2021-12-02 MED ORDER — MAGNESIUM OXIDE -MG SUPPLEMENT 400 (240 MG) MG PO TABS
400.0000 mg | ORAL_TABLET | Freq: Once | ORAL | Status: AC
  Administered 2021-12-02: 400 mg via ORAL
  Filled 2021-12-02: qty 1

## 2021-12-02 MED ORDER — DILTIAZEM HCL-DEXTROSE 125-5 MG/125ML-% IV SOLN (PREMIX)
5.0000 mg/h | INTRAVENOUS | Status: DC
  Administered 2021-12-02: 5 mg/h via INTRAVENOUS
  Filled 2021-12-02: qty 125

## 2021-12-02 MED ORDER — ROPINIROLE HCL 1 MG PO TABS
1.0000 mg | ORAL_TABLET | Freq: Every day | ORAL | Status: DC
  Administered 2021-12-02 – 2021-12-03 (×2): 1 mg via ORAL
  Filled 2021-12-02 (×2): qty 1

## 2021-12-02 MED ORDER — HEPARIN SODIUM (PORCINE) 1000 UNIT/ML DIALYSIS
2500.0000 [IU] | INTRAMUSCULAR | Status: DC | PRN
  Administered 2021-12-03: 2500 [IU] via INTRAVENOUS_CENTRAL
  Filled 2021-12-02 (×2): qty 3

## 2021-12-02 MED ORDER — ROSUVASTATIN CALCIUM 5 MG PO TABS
10.0000 mg | ORAL_TABLET | Freq: Every morning | ORAL | Status: DC
  Filled 2021-12-02: qty 2

## 2021-12-02 MED ORDER — BUDESONIDE 0.25 MG/2ML IN SUSP
0.2500 mg | Freq: Two times a day (BID) | RESPIRATORY_TRACT | Status: DC
  Administered 2021-12-02 – 2021-12-04 (×3): 0.25 mg via RESPIRATORY_TRACT
  Filled 2021-12-02 (×3): qty 2

## 2021-12-02 MED ORDER — CALCIUM CARBONATE ANTACID 500 MG PO CHEW: 1.0000 | CHEWABLE_TABLET | Freq: Once | ORAL | Status: DC

## 2021-12-02 MED ORDER — ASPIRIN EC 81 MG PO TBEC
81.0000 mg | DELAYED_RELEASE_TABLET | Freq: Every day | ORAL | Status: DC
Start: 2021-12-02 — End: 2021-12-04
  Administered 2021-12-02 – 2021-12-04 (×3): 81 mg via ORAL
  Filled 2021-12-02 (×3): qty 1

## 2021-12-02 MED ORDER — PANTOPRAZOLE SODIUM 40 MG PO TBEC
40.0000 mg | DELAYED_RELEASE_TABLET | Freq: Every day | ORAL | Status: DC
  Administered 2021-12-02 – 2021-12-03 (×2): 40 mg via ORAL
  Filled 2021-12-02 (×2): qty 1

## 2021-12-02 NOTE — ED Notes (Signed)
Pt is placed on the monitor and is given water. Tech assessed pt vitals nurse is aware of the blood pressure. Call light within reach.

## 2021-12-02 NOTE — H&P (Addendum)
NAME:  Albert Eaton, MRN:  [id number], DOB:  1966-08-25, LOS: 0 ADMISSION DATE:  12/02/2021, Primary: Teena Dunk, NP  CHIEF COMPLAINT:  shortness of breath   Medical Service: Internal Medicine Teaching Service         Attending Physician: Dr. Lottie Mussel, MD    First Contact: Dr. Elliot Gurney Pager: [phone]  Second Contact: Dr. Johnney Ou Pager: [phone]       After Hours (After 5p/  First Contact Pager: [phone]  weekends / holidays): Second Contact Pager: [phone]    History of present illness   Albert Eaton is a 56 year old male with end-stage renal disease on peritoneal dialysis, hypertension, diastolic heart failure, and HIV who presented to [facility] emergency department for shortness of breath.  He notes that he was in his usual state of health yesterday and felt okay upon awakening this morning.  He connected himself to his peritoneal dialysis and shortly thereafter developed shortness of breath.  He reports intermittent heart palpitations over the past couple of days.  Last occurrence was yesterday while in the shower.  Symptoms resolved after about 30 minutes of sitting down.  He has been experiencing 2-day history of increased dyspnea, decreased appetite and occasional nonbloody emesis.  He denies associated chest pain, cough, fevers, constipation, diarrhea, abdominal pain, headaches, or lightheadedness.  No recent sick contacts.  No recent medication changes.  He was started on hemodialysis in 2019 via his AV fistula.  He transition to peritoneal dialysis about 4 months ago.  He notes that he runs typically around 13 hours/day.  He is currently undergoing an evaluation through [facility] for renal transplant. Past Medical History  He,  has a past medical history of Anemia, Aortic atherosclerosis ([place]), Arthritis, Asthma, Cancer ([place]), Chronic diastolic CHF (congestive heart failure) ([place]) (01/06/2017), CKD (chronic kidney disease), Esophagitis, GERD  (gastroesophageal reflux disease), Gout, Heart murmur, Hepatitis, History of nuclear stress test, HIV (human immunodeficiency virus infection) ([place]), HLD (hyperlipidemia), Hypertension, Hypertensive heart disease with CHF (congestive heart failure) ([place]) (10/09/2016), Hypothyroidism, Mitral regurgitation, OSA (obstructive sleep apnea), Post-operative nausea and vomiting, Sleep apnea, Thyroid disease, Wears glasses, and Wears partial dentures.   Home Medications     Prior to Admission medications   Medication Sig Start Date End Date Taking? Authorizing Provider  ACETAMINOPHEN EXTRA STRENGTH 500 MG tablet Take 500 mg by mouth every 6 (six) hours as needed (pain.). 02/20/21  Yes [provider]  albuterol (PROAIR HFA) 108 (90 Base) MCG/ACT inhaler INHALE 2 PUFFS INTO THE LUNGS EVERY 4 HOURS AS NEEDED FOR WHEEZING OR SHORTNESS OF BREATH Patient taking differently: 2 puffs every 4 (four) hours as needed for wheezing or shortness of breath. 08/07/21  Yes Valentina Shaggy, MD  allopurinol (ZYLOPRIM) 100 MG tablet TAKE 1 TABLET(100 MG) BY MOUTH TWICE DAILY FOR 60 DOSES Patient taking differently: Take 100 mg by mouth 2 (two) times daily. 10/03/21  Yes [name], FNP  amLODipine (NORVASC) 10 MG tablet Take 1 tablet (10 mg total) by mouth daily. Patient taking differently: Take 10 mg by mouth every morning. 10/31/16  Yes [name], FNP  aspirin EC 81 MG tablet Take 1 tablet (81 mg total) by mouth daily. Swallow whole. Patient taking differently: Take 81 mg by mouth in the morning. Swallow whole. 03/06/21  Yes Weaver, Scott T, PA-C  azelastine (ASTELIN) 0.1 % nasal spray PLACE 2 SPRAYS INTO EACH NOSTRIL TWICE DAILY AS NEEDED FOR RHINITIS AS DIRECTED Patient taking differently: Place 2 sprays  into both nostrils 2 (two) times daily as needed for allergies or rhinitis. 06/05/21  Yes Valentina Shaggy, MD  BIKTARVY 50-200-25 MG TABS tablet TAKE 1 TABLET BY MOUTH DAILY Patient taking  differently: 1 tablet every morning. 07/08/21  Yes Campbell Riches, MD  calcitRIOL (ROCALTROL) 0.5 MCG capsule Take 0.5 mcg by mouth every Monday, Wednesday, and Friday. 02/06/21  Yes [provider]  chlorproMAZINE (THORAZINE) 10 MG tablet Take 1 tablet (10 mg total) by mouth 3 (three) times daily as needed for hiccoughs. Patient taking differently: Take 10 mg by mouth 2 (two) times daily. 02/26/21  Yes [name], FNP  cinacalcet (SENSIPAR) 30 MG tablet Take 30 mg by mouth every evening.   Yes [provider]  docusate sodium (COLACE) 100 MG capsule Take 100 mg by mouth 2 (two) times daily. 02/20/21  Yes [provider]  famotidine (PEPCID) 20 MG tablet Take 1 tablet (20 mg total) by mouth 2 (two) times daily. Please keep upcoming appt. With Cardilologist in order to receive further refills. Thank you. 11/14/21  Yes [name], MD  ferric citrate (AURYXIA) 1 GM 210 MG(Fe) tablet Take 210 mg by mouth daily with lunch.   Yes [provider]  fluticasone (FLOVENT HFA) 110 MCG/ACT inhaler INHALE 2 PUFFS INTO THE LUNGS TWICE DAILY Patient taking differently: Inhale 2 puffs into the lungs every morning. 06/24/21  Yes Valentina Shaggy, MD  gabapentin (NEURONTIN) 300 MG capsule TAKE 1 CAPSULE(300 MG) BY MOUTH THREE TIMES DAILY Patient taking differently: Take 300 mg by mouth at bedtime. 10/09/20  Yes [name], FNP  gentamicin cream (GARAMYCIN) 0.1 % Apply 1 application topically See admin instructions. Apply topically to exit site daily 05/28/21  Yes [provider]  hyoscyamine (LEVSIN SL) 0.125 MG SL tablet Place 1-2 tablets (0.125-0.25 mg total) under the tongue every 8 (eight) hours as needed (lower abdominal pain, spasm). 08/05/19  Yes Pyrtle, Lajuan Lines, MD  ipratropium (ATROVENT) 0.06 % nasal spray USE 2 SPRAYS IN EACH NOSTRIL 2 TO 3 TIMES DAILY AS NEEDED Patient taking differently: Place 2 sprays into both nostrils 3 (three) times daily as  needed (allergies). 09/03/20  Yes Valentina Shaggy, MD  lactulose [facility]) 10 GM/15ML solution Take 20 g by mouth daily as needed for moderate constipation.  07/08/20  Yes [provider]  levothyroxine (SYNTHROID) 25 MCG tablet TAKE 1 TABLET BY MOUTH DAILY BEFORE BREAKFAST Patient taking differently: Take 25 mcg by mouth daily before breakfast. 02/05/21  Yes [name], FNP  lubiprostone (AMITIZA) 24 MCG capsule TAKE 1 CAPSULE(24 MCG) BY MOUTH TWICE DAILY WITH A MEAL Patient taking differently: 24 mcg 2 (two) times daily as needed for constipation. 10/03/21  Yes Pyrtle, Lajuan Lines, MD  Methoxy PEG-Epoetin Beta (MIRCERA IJ) Inject 1 each into the skin every 30 (thirty) days. Administered at dialysis center once a month 11/08/20  Yes [provider]  metoprolol tartrate (LOPRESSOR) 25 MG tablet Take 0.5 tablets (12.5 mg total) by mouth 2 (two) times daily. 10/10/21 01/08/22 Yes Isaiah Serge, NP  multivitamin (RENA-VIT) TABS tablet Take 1 tablet by mouth at bedtime. 01/10/21  Yes [provider]  Olopatadine HCl (PAZEO) 0.7 % SOLN Place 1 drop into both eyes daily. Patient taking differently: Place 1 drop into both eyes daily as needed (itchy eyes). 05/24/20  Yes Valentina Shaggy, MD  pantoprazole (PROTONIX) 40 MG tablet TAKE 1 TABLET(40 MG) BY MOUTH DAILY Patient taking differently: 40 mg at  bedtime. 08/07/21  Yes Esterwood, Amy S, PA-C  rOPINIRole (REQUIP) 1 MG tablet Take 1 tablet (1 mg total) by mouth at bedtime. 08/12/21  Yes Olalere, Adewale A, MD  rosuvastatin (CRESTOR) 10 MG tablet Take 10 mg by mouth every morning.   Yes [provider]  Vitamin D, Ergocalciferol, (DRISDOL) 1.25 MG (50000 UNIT) CAPS capsule TAKE 1 CAPSULE BY MOUTH EVERY 7 DAYS Patient taking differently: Take 50,000 Units by mouth every Tuesday. At midday 02/05/21  Yes [name], FNP  Capsaicin-Menthol-Methyl Sal (CAPSAICIN-METHYL SAL-MENTHOL) 0.025-1-12 % CREA Apply 1  application topically 4 (four) times daily as needed (nerve pain). Patient not taking: Reported on 12/02/2021 11/11/21   Bo Merino I, NP  clobetasol (OLUX) 0.05 % topical foam Apply topically 2 (two) times daily. 08/21/21   Lavonna Monarch, MD  clonazePAM (KLONOPIN) 0.5 MG tablet Take 1 tablet (0.5 mg total) by mouth at bedtime. Patient not taking: Reported on 12/02/2021 01/02/21   Laurin Coder, MD  clotrimazole-betamethasone (LOTRISONE) cream Apply topically 2 (two) times daily. X 3-5 days. IF symptoms continue, call office Patient not taking: Reported on 12/02/2021 05/16/21   Pyrtle, Lajuan Lines, MD  hydrALAZINE (APRESOLINE) 100 MG tablet Take 1 tablet (100 mg total) by mouth 3 (three) times daily. Patient not taking: Reported on 12/02/2021 04/02/21 04/02/22  [name], MD  methocarbamol (ROBAXIN) 500 MG tablet Take 1 tablet (500 mg total) by mouth every 8 (eight) hours as needed for muscle spasms. Patient not taking: Reported on 12/02/2021 02/11/21   Maudie Flakes, MD  Spacer/Aero-Holding Chambers DEVI 1 Device by Does not apply route 2 (two) times a day. 03/01/19   Bobbitt, Sedalia Muta, MD    Allergies    Allergies as of 12/02/2021 - Review Complete 12/02/2021  Allergen Reaction Noted   Ace inhibitors Cough and Other (See Comments) 05/07/2015   Lisinopril Cough 03/18/2017    Social History   reports that he quit smoking about 23 years ago. His smoking use included cigarettes. He smoked an average of 1 pack per day. He has never used smokeless tobacco. He reports current alcohol use. He reports that he does not use drugs.   Family History   His family history includes Heart attack (age of onset: 64) in his maternal grandmother; Heart failure in his mother; Hypertension in his brother, sister, and sister; Multiple sclerosis in his sister; Scoliosis in an other family member. There is no history of Allergic rhinitis, Angioedema, Asthma, Eczema, Immunodeficiency, Urticaria, Colon cancer,  Pancreatic cancer, or Esophageal cancer.   ROS  10 point review of systems negative unless stated in the HPI.  Objective   Blood pressure 103/73, pulse 89, temperature (!) 97.4 F (36.3 C), temperature source Oral, resp. rate 17, height 5' 10"  (1.778 m), weight 85.5 kg, SpO2 100 %.    General: Acutely ill-appearing male in no acute distress Eyes: No scleral icterus or conjunctival injection HEENT: Moist mucous membranes, head atraumatic Cardiac: Irregular rate, regular rhythm.  No lower extremity edema.  AV fistula present in the left upper extremity. Pulm: Breathing comfortably on 2 L supplemental oxygen--O2 saturations 98-100%.  Lung sounds are clear throughout GI: Abdomen is soft, mildly distended, nontender to palpation.  Peritoneal access present. Neuro: Alert and oriented x4 Skin: No rash or lesions on limited exam MSK: Normal muscle bulk and tone Significant Diagnostic Tests:   CXR: new right hemidiaphragm. No edema  Labs    CBC Latest Ref Rng & Units 12/02/2021 10/10/2021 10/08/2021  WBC 4.0 - 10.5 K/uL 9.2 8.0 6.7  Hemoglobin 13.0 - 17.0 g/dL 13.6 10.0(L) 9.1(L)  Hematocrit 39.0 - 52.0 % 37.2(L) 29.0(L) 26.0(L)  Platelets 150 - 400 K/uL 359 283 238   BMP Latest Ref Rng & Units 12/02/2021 10/10/2021 10/08/2021  Glucose 70 - 99 mg/dL 138(H) 116(H) 102(H)  BUN 6 - 20 mg/dL 53(H) 63(H) 64(H)  Creatinine 0.61 - 1.24 mg/dL 19.43(H) 20.19(H) 19.49(H)  BUN/Creat Ratio 6 - 22 (calc) - - -  Sodium 135 - 145 mmol/L 129(L) 133(L) 131(L)  Potassium 3.5 - 5.1 mmol/L 2.4(LL) 3.3(L) 3.6  Chloride 98 - 111 mmol/L 84(L) 91(L) 90(L)  CO2 22 - 32 mmol/L 19(L) 20(L) 21(L)  Calcium 8.9 - 10.3 mg/dL 10.2 10.4(H) 9.7    Summary  56 yo chronically ill male with CHF, transient atrial tachycardias, ESRD on PD, and HIV who presented for shortness of breath and subsequently found to be in afib RVR and hypokalemia.  Assessment & Plan:  Principal Problem:   Hypokalemia Active Problems:    Hypertensive heart disease with CHF (congestive heart failure) (HCC)   ESRD (end stage renal disease) on dialysis (HCC)   Atrial fibrillation with RVR (HCC)  Atrial tachycardia with RVR, PAF -- CHADS2VASC 2= 4%  -- Likely induced by dehydration and inability to tolerate oral medications, including metoprolol, at home.  -- Suspect dyspnea is due to atrial tachycardia. Saturating ok on room air. Does not appear volume up, no pulmonary edema on CXR. No evidence of infection. Noted to be transient but occurring more frequently at his last cardiology visit 12/15. Due to orthostatic hypotension, he was changed from coreg to metoprolol at a lower dose to reduce BP effect. Planned to continue medical management unless his symptoms became more frequent, at which time he would be referred to EP. Placed on diltiazem gtt by EDP. Will discontinue drip and resume oral metoprolol if tolerated.  Start eliquis. Note: not on anticoagulation prior to admission Tele monitoring Will need cardiology follow up after discharge  Diastolic heart failure not in acute exacerbation. BNP 43 on admission, no edema on CXR. Mod-severe MR Chronic hypertension with History of orthostasis. Orthostasis noted to improve after coreg was changed to metoprolol Chronic QT prolongation Non-obstructive CAD, hyperlipidemia. Last [facility] 08/2021. Continue amlodipine, metoprolol, Asprin, crestor Hold hydralazine for now in light of low-normal pressures and anticipation of HD session today, resume as needed  ESRD on peritoneal dialysis with uremic symptomatology. 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 Started on dialysis in 2019, transitioned to PD ~50moago. [facility] K 2.4, Na 129, Mag 1.6, Phos 7.1 Discussed with Dr. SJonnie Finner nephrology will continue to follow. We are suspecting that PD may not be sufficiently dialyzing him. Planning for HD today via  established left AVF/AFG Replete K  Hyperglycemia--check A1C  New right hemidiaphragm--unclear significance or etiology.  HIV with neuropathy. Followed by [facility]. Continue biktarvy and gabapentin  OSA. Nonadherent to CPAP.  Hypothyroidism. Continue synthroid  Gout. Chronic and stable. Continue allopurinol  GERD. Continue protonix  History of renal cell carcinoma s/p left nephrectomy [facility]  Best practice:  CODE STATUS: Full DVT for prophylaxis: heparin Dispo: Admit patient to Inpatient with expected length of stay greater than 2 midnights.   RMitzi Hansen MD Internal Medicine Resident PGY-3 [facility] 12/02/2021 12:46 PM

## 2021-12-02 NOTE — Procedures (Signed)
° °  I was present at this dialysis session, have reviewed the session itself and made  appropriate changes Kelly Splinter MD [place] pager [phone]   12/02/2021, 4:14 PM

## 2021-12-02 NOTE — ED Notes (Signed)
Maalox ordered under general PRN order sets after medication cleared for use by pharmacist.

## 2021-12-02 NOTE — Consult Note (Signed)
Renal Service Consult Note [facility]  Albert Eaton 12/02/2021 Sol Blazing, MD Requesting Physician: Dr Doren Custard  Reason for Consult: ESRD pt w/ general fatigue, N/V, azotemia HPI: The patient is a 56 y.o. year-old w/ hx of HIV, ESRD on PD, gout, GERD, hepatitis B +, HTN, HL and HFpEF presented to ED w/ c/o gen'd weakness, fatiuge and SOB w/ exertion. In ED K+ was low at 2.4 and pt will be admitted. Asked to see for ESRD.    Pt seen in ED. States he is very compliant w/ his PD regimen. He started in August 2022, prior to that was on home HD.  He was on HD for about 2.5 yrs before switching to PD in August. Pt describes recent onset of progressive fatigue, DOE and loss of appetite. Also brain fog and difficulty ambulating due to gen'd weakness. Trouble focusing. +n/v once in the last 2 days. Denies missing any PD.   Spoke w/ his RN at PD center , [place]. Creat in early Aug was 9, he started PD then. Late Aug creat was up to 11. Later in the year creat was up to 17.  His prescription was increased and his renal MD Dr Hollie Salk told him that he may have to go back to hemodialysis is the clearances didn't improve.     ROS - denies CP, no joint pain, no HA, no blurry vision, no rash, no dysuria, no difficulty voiding   Past Medical History  Past Medical History:  Diagnosis Date   Anemia    Aortic atherosclerosis (HCC)    Arthritis    Asthma    Cancer ([place])    renal cyst   Chronic diastolic CHF (congestive heart failure) ([place]) 01/06/2017   Echo 7/16 [facility] in [place], [name]) Mild AI, mild LAE, mild concentric LVH, EF 55, normal wall motion, mild to moderate MR, mild PI, RVSP 55 // Echo 10/09/16 ([facility]):  Moderate LVH, grade 2 diastolic dysfunction, mild MR, moderate LAE    CKD (chronic kidney disease)    Dialysis Mon Wed Fri   Esophagitis    GERD (gastroesophageal reflux disease)    Gout    no current problems   Heart murmur    never caused any problems    Hepatitis    Hep B   History of nuclear stress test    a. Nuc study 7/16: no scar or ischemia, EF 42 // b. Nuc study 12/17: EF 48, ?small apical ischemia, Low Risk   HIV (human immunodeficiency virus infection) ([place])    HLD (hyperlipidemia)    Hypertension    Hypertensive heart disease with CHF (congestive heart failure) ([place]) 10/09/2016   Hypothyroidism    Mitral regurgitation    OSA (obstructive sleep apnea)    Post-operative nausea and vomiting    1 time as a child, no problems as an adult   Sleep apnea    uses c-pap   Thyroid disease    Wears glasses    Wears partial dentures    Past Surgical History  Past Surgical History:  Procedure Laterality Date   AV FISTULA PLACEMENT Left 07/02/2018   Procedure: Creation of Left arm BRACHIOBASILIC ARTERIOVENOUS  FISTULA;  Surgeon: Marty Heck, MD;  Location: [place];  Service: Vascular;  Laterality: Left;   [place] Left 08/27/2018   Procedure: Left arm BRACHIOBASILIC VEIN TRANSPOSITION SECOND STAGE;  Surgeon: Marty Heck, MD;  Location: [place];  Service: Vascular;  Laterality: Left;  BUBBLE STUDY  09/03/2020   Procedure: BUBBLE STUDY;  Surgeon: Fay Records, MD;  Location: [place];  Service: Cardiovascular;;   CAPD INSERTION N/A 05/30/2021   Procedure: LAPAROSCOPIC INSERTION CONTINUOUS AMBULATORY PERITONEAL DIALYSIS  (CAPD) CATHETER;  Surgeon: [name], MD;  Location: [place];  Service: Vascular;  Laterality: N/A;   CHOLECYSTECTOMY     COLONOSCOPY W/ BIOPSIES AND POLYPECTOMY     GIVENS CAPSULE STUDY N/A 03/01/2018   Procedure: GIVENS CAPSULE STUDY;  Surgeon: Jerene Bears, MD;  Location: [place];  Service: Gastroenterology;  Laterality: N/A;   HERNIA REPAIR     As baby   MULTIPLE TOOTH EXTRACTIONS     NEPHRECTOMY Left 02/18/2021   NOSE SURGERY     RENAL BIOPSY     RIGHT/LEFT HEART CATH AND CORONARY ANGIOGRAPHY N/A 09/04/2021   Procedure: RIGHT/LEFT HEART CATH AND CORONARY  ANGIOGRAPHY;  Surgeon: Burnell Blanks, MD;  Location: [place] [facility];  Service: Cardiovascular;  Laterality: N/A;   TEE WITHOUT CARDIOVERSION N/A 09/03/2020   Procedure: TRANSESOPHAGEAL ECHOCARDIOGRAM (TEE);  Surgeon: Fay Records, MD;  Location: [facility] ENDOSCOPY;  Service: Cardiovascular;  Laterality: N/A;   UPPER GI ENDOSCOPY  04/05/2021   Family History  Family History  Problem Relation Age of Onset   Heart failure Mother    Heart attack Maternal Grandmother 56   Hypertension Sister    Multiple sclerosis Sister    Hypertension Brother    Hypertension Sister    Scoliosis Other    Allergic rhinitis Neg Hx    Angioedema Neg Hx    Asthma Neg Hx    Eczema Neg Hx    Immunodeficiency Neg Hx    Urticaria Neg Hx    Colon cancer Neg Hx    Pancreatic cancer Neg Hx    Esophageal cancer Neg Hx    Social History  reports that he quit smoking about 23 years ago. His smoking use included cigarettes. He smoked an average of 1 pack per day. He has never used smokeless tobacco. He reports current alcohol use. He reports that he does not use drugs. Allergies  Allergies  Allergen Reactions   Ace Inhibitors Cough and Other (See Comments)   Lisinopril Cough   Home medications Prior to Admission medications   Medication Sig Start Date End Date Taking? Authorizing Provider  ACETAMINOPHEN EXTRA STRENGTH 500 MG tablet Take 500 mg by mouth every 6 (six) hours as needed (pain.). 02/20/21  Yes [provider]  albuterol (PROAIR HFA) 108 (90 Base) MCG/ACT inhaler INHALE 2 PUFFS INTO THE LUNGS EVERY 4 HOURS AS NEEDED FOR WHEEZING OR SHORTNESS OF BREATH Patient taking differently: 2 puffs every 4 (four) hours as needed for wheezing or shortness of breath. 08/07/21  Yes Valentina Shaggy, MD  allopurinol (ZYLOPRIM) 100 MG tablet TAKE 1 TABLET(100 MG) BY MOUTH TWICE DAILY FOR 60 DOSES Patient taking differently: Take 100 mg by mouth 2 (two) times daily. 10/03/21  Yes [name],  FNP  amLODipine (NORVASC) 10 MG tablet Take 1 tablet (10 mg total) by mouth daily. Patient taking differently: Take 10 mg by mouth every morning. 10/31/16  Yes [name], FNP  aspirin EC 81 MG tablet Take 1 tablet (81 mg total) by mouth daily. Swallow whole. Patient taking differently: Take 81 mg by mouth in the morning. Swallow whole. 03/06/21  Yes Weaver, Scott T, PA-C  azelastine (ASTELIN) 0.1 % nasal spray PLACE 2 SPRAYS INTO EACH NOSTRIL TWICE DAILY AS NEEDED  FOR RHINITIS AS DIRECTED Patient taking differently: Place 2 sprays into both nostrils 2 (two) times daily as needed for allergies or rhinitis. 06/05/21  Yes Valentina Shaggy, MD  BIKTARVY 50-200-25 MG TABS tablet TAKE 1 TABLET BY MOUTH DAILY Patient taking differently: 1 tablet every morning. 07/08/21  Yes Campbell Riches, MD  calcitRIOL (ROCALTROL) 0.5 MCG capsule Take 0.5 mcg by mouth every Monday, Wednesday, and Friday. 02/06/21  Yes [provider]  chlorproMAZINE (THORAZINE) 10 MG tablet Take 1 tablet (10 mg total) by mouth 3 (three) times daily as needed for hiccoughs. Patient taking differently: Take 10 mg by mouth 2 (two) times daily. 02/26/21  Yes [name], FNP  cinacalcet (SENSIPAR) 30 MG tablet Take 30 mg by mouth every evening.   Yes [provider]  docusate sodium (COLACE) 100 MG capsule Take 100 mg by mouth 2 (two) times daily. 02/20/21  Yes [provider]  famotidine (PEPCID) 20 MG tablet Take 1 tablet (20 mg total) by mouth 2 (two) times daily. Please keep upcoming appt. With Cardilologist in order to receive further refills. Thank you. 11/14/21  Yes [name], MD  ferric citrate (AURYXIA) 1 GM 210 MG(Fe) tablet Take 210 mg by mouth daily with lunch.   Yes [provider]  fluticasone (FLOVENT HFA) 110 MCG/ACT inhaler INHALE 2 PUFFS INTO THE LUNGS TWICE DAILY Patient taking differently: Inhale 2 puffs into the lungs every morning. 06/24/21  Yes Valentina Shaggy, MD  gabapentin (NEURONTIN) 300 MG capsule TAKE 1 CAPSULE(300 MG) BY MOUTH THREE TIMES DAILY Patient taking differently: Take 300 mg by mouth at bedtime. 10/09/20  Yes [name], FNP  gentamicin cream (GARAMYCIN) 0.1 % Apply 1 application topically See admin instructions. Apply topically to exit site daily 05/28/21  Yes [provider]  hyoscyamine (LEVSIN SL) 0.125 MG SL tablet Place 1-2 tablets (0.125-0.25 mg total) under the tongue every 8 (eight) hours as needed (lower abdominal pain, spasm). 08/05/19  Yes Pyrtle, Lajuan Lines, MD  ipratropium (ATROVENT) 0.06 % nasal spray USE 2 SPRAYS IN EACH NOSTRIL 2 TO 3 TIMES DAILY AS NEEDED Patient taking differently: Place 2 sprays into both nostrils 3 (three) times daily as needed (allergies). 09/03/20  Yes Valentina Shaggy, MD  lactulose [facility]) 10 GM/15ML solution Take 20 g by mouth daily as needed for moderate constipation.  07/08/20  Yes [provider]  levothyroxine (SYNTHROID) 25 MCG tablet TAKE 1 TABLET BY MOUTH DAILY BEFORE BREAKFAST Patient taking differently: Take 25 mcg by mouth daily before breakfast. 02/05/21  Yes [name], FNP  lubiprostone (AMITIZA) 24 MCG capsule TAKE 1 CAPSULE(24 MCG) BY MOUTH TWICE DAILY WITH A MEAL Patient taking differently: 24 mcg 2 (two) times daily as needed for constipation. 10/03/21  Yes Pyrtle, Lajuan Lines, MD  Methoxy PEG-Epoetin Beta (MIRCERA IJ) Inject 1 each into the skin every 30 (thirty) days. Administered at dialysis center once a month 11/08/20  Yes [provider]  metoprolol tartrate (LOPRESSOR) 25 MG tablet Take 0.5 tablets (12.5 mg total) by mouth 2 (two) times daily. 10/10/21 01/08/22 Yes Isaiah Serge, NP  multivitamin (RENA-VIT) TABS tablet Take 1 tablet by mouth at bedtime. 01/10/21  Yes [provider]  Olopatadine HCl (PAZEO) 0.7 % SOLN Place 1 drop into both eyes daily. Patient taking differently: Place 1 drop into both eyes daily as needed (itchy  eyes). 05/24/20  Yes Valentina Shaggy, MD  pantoprazole (PROTONIX) 40 MG tablet TAKE 1 TABLET(40  MG) BY MOUTH DAILY Patient taking differently: 40 mg at bedtime. 08/07/21  Yes Esterwood, Amy S, PA-C  rOPINIRole (REQUIP) 1 MG tablet Take 1 tablet (1 mg total) by mouth at bedtime. 08/12/21  Yes Olalere, Adewale A, MD  rosuvastatin (CRESTOR) 10 MG tablet Take 10 mg by mouth every morning.   Yes [provider]  Vitamin D, Ergocalciferol, (DRISDOL) 1.25 MG (50000 UNIT) CAPS capsule TAKE 1 CAPSULE BY MOUTH EVERY 7 DAYS Patient taking differently: Take 50,000 Units by mouth every Tuesday. At midday 02/05/21  Yes [name], FNP  Capsaicin-Menthol-Methyl Sal (CAPSAICIN-METHYL SAL-MENTHOL) 0.025-1-12 % CREA Apply 1 application topically 4 (four) times daily as needed (nerve pain). Patient not taking: Reported on 12/02/2021 11/11/21   Bo Merino I, NP  clobetasol (OLUX) 0.05 % topical foam Apply topically 2 (two) times daily. 08/21/21   Lavonna Monarch, MD  clonazePAM (KLONOPIN) 0.5 MG tablet Take 1 tablet (0.5 mg total) by mouth at bedtime. Patient not taking: Reported on 12/02/2021 01/02/21   Laurin Coder, MD  clotrimazole-betamethasone (LOTRISONE) cream Apply topically 2 (two) times daily. X 3-5 days. IF symptoms continue, call office Patient not taking: Reported on 12/02/2021 05/16/21   Pyrtle, Lajuan Lines, MD  hydrALAZINE (APRESOLINE) 100 MG tablet Take 1 tablet (100 mg total) by mouth 3 (three) times daily. Patient not taking: Reported on 12/02/2021 04/02/21 04/02/22  [name], MD  methocarbamol (ROBAXIN) 500 MG tablet Take 1 tablet (500 mg total) by mouth every 8 (eight) hours as needed for muscle spasms. Patient not taking: Reported on 12/02/2021 02/11/21   Maudie Flakes, MD  Spacer/Aero-Holding Chambers DEVI 1 Device by Does not apply route 2 (two) times a day. 03/01/19   Bobbitt, Sedalia Muta, MD     Vitals:   12/02/21 1005 12/02/21 1010 12/02/21 1015 12/02/21 1030  BP: 112/85  113/87 93/70 116/90  Pulse: 91 86 85 88  Resp: 15 18 15  (!) 26  Temp:      TempSrc:      SpO2: 100% 100% 100% 100%  Weight:      Height:       Exam Gen alert, no distress, gen malaise appearance No rash, cyanosis or gangrene Sclera anicteric, throat clear  No jvd or bruits Chest clear bilat to bases, no rales/ wheezing RRR no MRG Abd soft ntnd no mass or ascites +bs GU normal MS no joint effusions or deformity Ext no LE or UE edema, no wounds or ulcers Neuro is alert, Ox 3 , nf LUA aVF+bruit, LLQ PD cath w/ clean exit site      OP PD: 6 exchanges, 2500 fill vol, dwell time 1.5h, 88kg    Assessment/ Plan: Gen'd weakness/ N/V - w/ loss of appetite, progressive fatigue and gen'd weakness w/ ambulation issues. Creat up to 20 now, it was 9 when he started PD in Aug 2022.  Suspect he is failing PD, will plan to do HD today and tomorrow and see if he feels any better. No signs of infection, no fever/^wbc. Will follow.  ESRD - was on HD 2.5 yrs then switched to PD in Aug 2022. As above.  BP/ vol - BP's are wnl or on the low side, looks a bit dry. CXR is clear w/o edema.  May need a bit of IVF's.  Afib/ RVR - will need to stop IV dilt gtt prior to going to HD, then can resume post HD. Use IV metoprolol pushes during HD as needed.  HIV HTN -  would hold his home meds for now.  Hep B      Kelly Splinter  MD 12/02/2021, 10:37 AM  Recent Labs  Lab 12/02/21 0645  WBC 9.2  HGB 13.6   Recent Labs  Lab 12/02/21 0645 12/02/21 0725  K 2.4*  --   BUN 53*  --   CREATININE 19.43*  --   CALCIUM 10.2  --   PHOS  --  7.1*

## 2021-12-02 NOTE — ED Triage Notes (Addendum)
Pt arrived via [facility] with cc of shOB. Pt completed home peritoneal dialysis and then become short of breath. Pt believe he took too much off. Pt typical dry weight is 89.5kg but was down to 85.9kg  78/38, 100HR, 90% RA --> 98% 3L 597m NSS

## 2021-12-02 NOTE — ED Notes (Signed)
Potassium 2.4

## 2021-12-02 NOTE — ED Provider Notes (Signed)
[facility] EMERGENCY DEPARTMENT Provider Note   CSN: [id number] Arrival date & time: 12/02/21  8338     History  Chief Complaint  Patient presents with   Shortness of Breath    Albert Eaton is a 56 y.o. male with a past medical history of congestive heart failure, HTN, HLD, HIV, mitral regurg, ESRD on nightly peritoneal dialysis presenting due to shortness of breath.  Patient believes he took off too much fluid during his overnight peritoneal dialysis.  He reports he did his session for 13 hours, as instructed, but that his dry weight is around 89.5 and this morning he weighed 85.9 kg.  Takes his blood pressure daily at home and states that it has been low for the past week.  Last viral load undetectable and last CD4 count within normal limits.  No history of DVT/PE.  Denies any chest pain, dizziness, syncope but endorses palpitations.  Says that this also happened yesterday while he was in the shower and he sat down for about 30 minutes and felt better.  At that time he felt as though he may pass out.  Reports 1 episode of emesis this morning however states that it is small because he has not been eating or drinking very much over the past 4 days.  Reports a sharp decrease in his appetite, without abdominal pain, nausea or diarrhea.  He has only reportedly had an apple over the past few days.  No fevers or chills, no sick contacts.  Patient is anuric      Home Medications Prior to Admission medications   Medication Sig Start Date End Date Taking? Authorizing Provider  ACETAMINOPHEN EXTRA STRENGTH 500 MG tablet Take 500 mg by mouth every 6 (six) hours as needed (pain.). 02/20/21   [provider]  albuterol (PROAIR HFA) 108 (90 Base) MCG/ACT inhaler INHALE 2 PUFFS INTO THE LUNGS EVERY 4 HOURS AS NEEDED FOR WHEEZING OR SHORTNESS OF BREATH 08/07/21   Valentina Shaggy, MD  allopurinol (ZYLOPRIM) 100 MG tablet TAKE 1 TABLET(100 MG) BY MOUTH TWICE DAILY FOR 60  DOSES 10/03/21   [name], FNP  amLODipine (NORVASC) 10 MG tablet Take 1 tablet (10 mg total) by mouth daily. Patient taking differently: Take 10 mg by mouth daily at 12 noon. At midday 10/31/16   [name], FNP  aspirin EC 81 MG tablet Take 1 tablet (81 mg total) by mouth daily. Swallow whole. Patient taking differently: Take 81 mg by mouth in the morning. Swallow whole. 03/06/21   Weaver, Scott T, PA-C  AURYXIA 1 GM 210 MG(Fe) tablet Take by mouth. 11/04/21   [provider]  azelastine (ASTELIN) 0.1 % nasal spray PLACE 2 SPRAYS INTO EACH NOSTRIL TWICE DAILY AS NEEDED FOR RHINITIS AS DIRECTED Patient taking differently: Place 2 sprays into both nostrils 2 (two) times daily as needed for allergies or rhinitis. 06/05/21   Valentina Shaggy, MD  BIKTARVY [phone] MG TABS tablet TAKE 1 TABLET BY MOUTH DAILY 07/08/21   Campbell Riches, MD  calcitRIOL (ROCALTROL) 0.5 MCG capsule Take 0.5 mcg by mouth every Monday, Wednesday, and Friday. 02/06/21   [provider]  Capsaicin-Menthol-Methyl Sal (CAPSAICIN-METHYL SAL-MENTHOL) 0.025-1-12 % CREA Apply 1 application topically 4 (four) times daily as needed (nerve pain). 11/11/21   Passmore, Jake Church I, NP  chlorproMAZINE (THORAZINE) 10 MG tablet Take 1 tablet (10 mg total) by mouth 3 (three) times daily as needed for hiccoughs. Patient taking differently: Take 10 mg  by mouth 3 (three) times daily. 02/26/21   [name], FNP  clobetasol (OLUX) 0.05 % topical foam Apply topically 2 (two) times daily. 08/21/21   Lavonna Monarch, MD  clonazePAM (KLONOPIN) 0.5 MG tablet Take 1 tablet (0.5 mg total) by mouth at bedtime. 01/02/21   Laurin Coder, MD  clotrimazole-betamethasone (LOTRISONE) cream Apply topically 2 (two) times daily. X 3-5 days. IF symptoms continue, call office 05/16/21   Pyrtle, Lajuan Lines, MD  docusate sodium (COLACE) 100 MG capsule Take 100 mg by mouth 2 (two) times daily. 02/20/21   [provider]   famotidine (PEPCID) 20 MG tablet Take 1 tablet (20 mg total) by mouth 2 (two) times daily. Please keep upcoming appt. With Cardilologist in order to receive further refills. Thank you. 11/14/21   [name], MD  fluticasone (FLOVENT HFA) 110 MCG/ACT inhaler INHALE 2 PUFFS INTO THE LUNGS TWICE DAILY Patient taking differently: Inhale 2 puffs into the lungs 2 (two) times daily as needed (sob/wheezing). 06/24/21   Valentina Shaggy, MD  gabapentin (NEURONTIN) 300 MG capsule TAKE 1 CAPSULE(300 MG) BY MOUTH THREE TIMES DAILY Patient taking differently: Take 300 mg by mouth 3 (three) times daily. 10/09/20   [name], FNP  gentamicin cream (GARAMYCIN) 0.1 % Apply 1 application topically See admin instructions. Apply when catheter is flushed and cleaned 05/28/21   [provider]  hydrALAZINE (APRESOLINE) 100 MG tablet Take 1 tablet (100 mg total) by mouth 3 (three) times daily. 04/02/21 04/02/22  [name], MD  hyoscyamine (LEVSIN SL) 0.125 MG SL tablet Place 1-2 tablets (0.125-0.25 mg total) under the tongue every 8 (eight) hours as needed (lower abdominal pain, spasm). 08/05/19   Pyrtle, Lajuan Lines, MD  ipratropium (ATROVENT) 0.06 % nasal spray USE 2 SPRAYS IN EACH NOSTRIL 2 TO 3 TIMES DAILY AS NEEDED Patient taking differently: Place 2 sprays into both nostrils 3 (three) times daily as needed (allergies). 09/03/20   Valentina Shaggy, MD  iron sucrose (VENOFER) 20 MG/ML injection Inject into the vein. 07/12/20   [provider]  lactulose (CHRONULAC) 10 GM/15ML solution Take 20 g by mouth daily as needed for moderate constipation.  07/08/20   [provider]  levothyroxine (SYNTHROID) 25 MCG tablet TAKE 1 TABLET BY MOUTH DAILY BEFORE BREAKFAST Patient taking differently: Take 25 mcg by mouth daily before breakfast. 02/05/21   [name], FNP  lidocaine-prilocaine (EMLA) cream Apply 1 application topically See admin instructions. Apply to access site 1-2 hours  before dialysis 06/01/19   [provider]  lubiprostone (AMITIZA) 24 MCG capsule TAKE 1 CAPSULE(24 MCG) BY MOUTH TWICE DAILY WITH A MEAL Patient taking differently: 24 mcg 2 (two) times daily as needed for constipation. 10/03/21   Pyrtle, Lajuan Lines, MD  methocarbamol (ROBAXIN) 500 MG tablet Take 1 tablet (500 mg total) by mouth every 8 (eight) hours as needed for muscle spasms. Patient taking differently: Take 500 mg by mouth at bedtime. 02/11/21   Maudie Flakes, MD  Methoxy PEG-Epoetin Beta (MIRCERA IJ) Inject into the skin. 11/08/20   [provider]  Methoxy PEG-Epoetin Beta (MIRCERA IJ) Inject into the skin. 11/05/21   [provider]  metoprolol tartrate (LOPRESSOR) 25 MG tablet Take 0.5 tablets (12.5 mg total) by mouth 2 (two) times daily. 10/10/21 01/08/22  Isaiah Serge, NP  multivitamin (RENA-VIT) TABS tablet Take 1 tablet by mouth at bedtime. 01/10/21   [provider]  mupirocin ointment (BACTROBAN) 2 % Apply 1  application topically daily as needed (apply to port after dialysis).  04/24/20   [provider]  Olopatadine HCl (PAZEO) 0.7 % SOLN Place 1 drop into both eyes daily. Patient taking differently: Place 1 drop into both eyes daily as needed (itchy eyes). 05/24/20   Valentina Shaggy, MD  pantoprazole (PROTONIX) 40 MG tablet TAKE 1 TABLET(40 MG) BY MOUTH DAILY 08/07/21   Esterwood, Amy S, PA-C  rOPINIRole (REQUIP) 1 MG tablet Take 1 tablet (1 mg total) by mouth at bedtime. 08/12/21   Olalere, Cicero Duck A, MD  rosuvastatin (CRESTOR) 10 MG tablet Take 10 mg by mouth daily.    [provider]  senna (SENOKOT) 8.6 MG TABS tablet Take 2 tablets by mouth every evening. 02/20/21   [provider]  SENSIPAR 30 MG tablet 30 mg every evening. 09/17/21   [provider]  sevelamer carbonate (RENVELA) 800 MG tablet Take 1,600 mg by mouth 3 (three) times daily with meals. 11/09/20   [provider]  Spacer/Aero-Holding  Josiah Lobo DEVI 1 Device by Does not apply route 2 (two) times a day. 03/01/19   Bobbitt, Sedalia Muta, MD  Vitamin D, Ergocalciferol, (DRISDOL) 1.25 MG (50000 UNIT) CAPS capsule TAKE 1 CAPSULE BY MOUTH EVERY 7 DAYS Patient taking differently: Take 50,000 Units by mouth every Tuesday. At midday 02/05/21   [name], FNP      Allergies    Ace inhibitors and Lisinopril    Review of Systems   Review of Systems  Constitutional:  Negative for fever.  Respiratory:  Positive for shortness of breath.   Cardiovascular:  Negative for chest pain and leg swelling.  Genitourinary:  Negative for difficulty urinating (anuric).  See HPI  Physical Exam Updated Vital Signs BP (!) 89/72    Pulse (!) 47    Temp (!) 97.4 F (36.3 C) (Oral)    Resp 16    Ht 5' 10"  (1.778 m)    Wt 85.5 kg    SpO2 100%    BMI 27.05 kg/m  Physical Exam Vitals and nursing note reviewed.  Constitutional:      General: He is not in acute distress.    Appearance: Normal appearance. He is not diaphoretic.  HENT:     Head: Normocephalic and atraumatic.     Mouth/Throat:     Mouth: Mucous membranes are moist.     Pharynx: Oropharynx is clear.  Eyes:     General: No scleral icterus.    Conjunctiva/sclera: Conjunctivae normal.  Cardiovascular:     Rate and Rhythm: Tachycardia present. Rhythm irregular.  Pulmonary:     Effort: Pulmonary effort is normal. No respiratory distress.     Breath sounds: No decreased breath sounds, wheezing or rales.  Abdominal:     Palpations: Abdomen is soft.     Tenderness: There is no abdominal tenderness.  Musculoskeletal:     Right lower leg: No edema.     Left lower leg: No edema.  Skin:    General: Skin is warm and dry.  Neurological:     Mental Status: He is alert.  Psychiatric:        Mood and Affect: Mood normal. Mood is not anxious.        Behavior: Behavior normal.    ED Results / Procedures / Treatments   Labs (all labs ordered are listed, but only abnormal results are  displayed) Labs Reviewed  BASIC METABOLIC PANEL - Abnormal; Notable for the following components:  Result Value   Sodium 129 (*)    Potassium 2.4 (*)    Chloride 84 (*)    CO2 19 (*)    Glucose, Bld 138 (*)    BUN 53 (*)    Creatinine, Ser 19.43 (*)    GFR, Estimated 3 (*)    Anion gap 26 (*)    All other components within normal limits  MAGNESIUM - Abnormal; Notable for the following components:   Magnesium 1.6 (*)    All other components within normal limits  CBC - Abnormal; Notable for the following components:   RBC 3.88 (*)    HCT 37.2 (*)    MCH 35.1 (*)    MCHC 36.6 (*)    nRBC 0.3 (*)    All other components within normal limits  PHOSPHORUS - Abnormal; Notable for the following components:   Phosphorus 7.1 (*)    All other components within normal limits  RESP PANEL BY RT-PCR (FLU A&B, COVID) ARPGX2  BRAIN NATRIURETIC PEPTIDE    EKG EKG Interpretation  Date/Time:  Monday December 02 2021 06:32:34 EST Ventricular Rate:  152 PR Interval:    QRS Duration: 118 QT Interval:  362 QTC Calculation: 576 R Axis:   30 Text Interpretation: Sinus tachycardia vs flutter Ventricular premature complex Incomplete right bundle branch block Repol abnrm suggests ischemia, lateral leads Confirmed by Addison Lank [phone]) on 12/02/2021 6:53:11 AM  Radiology DG Chest Portable 1 View  Result Date: 12/02/2021 CLINICAL DATA:  56 year old male with history of shortness of breath. EXAM: PORTABLE CHEST 1 VIEW COMPARISON:  Chest x-ray 10/10/2021. FINDINGS: Elevation of the right hemidiaphragm, new compared to the prior examination. Lung volumes are normal. No consolidative airspace disease. No pleural effusions. No pneumothorax. No pulmonary nodule or mass noted. Pulmonary vasculature is normal. Heart size is mildly enlarged. Upper mediastinal contours are within normal limits. IMPRESSION: 1. New elevation of the right hemidiaphragm is of uncertain etiology and significance. 2. Mild  cardiomegaly. Electronically Signed   By: Vinnie Langton M.D.   On: 12/02/2021 07:10    Procedures .Critical Care Performed by: Rhae Hammock, PA-C Authorized by: Rhae Hammock, PA-C   Critical care provider statement:    Critical care time (minutes):  30   Critical care was necessary to treat or prevent imminent or life-threatening deterioration of the following conditions:  Renal failure, dehydration and metabolic crisis   Critical care was time spent personally by me on the following activities:  Development of treatment plan with patient or surrogate, discussions with consultants, evaluation of patient's response to treatment, examination of patient, ordering and review of laboratory studies, ordering and review of radiographic studies, ordering and performing treatments and interventions, pulse oximetry, re-evaluation of patient's condition and review of old charts   I assumed direction of critical care for this patient from another provider in my specialty: no    On first assessment, patient in atrial fibrillation with RVR.  Reportedly got 10 of diltiazem in route.  While I was at the bedside, patient would have moments of conversion into sinus rhythm but would rapidly return to atrial fibrillation.  Cardizem load and infusion ordered as well as Eliquis.  Medications Ordered in ED Medications  diltiazem (CARDIZEM) 1 mg/mL load via infusion 10 mg (10 mg Intravenous Bolus from Bag 12/02/21 0745)    And  diltiazem (CARDIZEM) 125 mg in dextrose 5% 125 mL (1 mg/mL) infusion (5 mg/hr Intravenous New Bag/Given 12/02/21 0745)  potassium chloride 10 mEq in  100 mL IVPB (10 mEq Intravenous New Bag/Given 12/02/21 0823)  sodium chloride 0.9 % bolus 500 mL (500 mLs Intravenous New Bag/Given 12/02/21 6734)  apixaban (ELIQUIS) tablet 5 mg (5 mg Oral Given 12/02/21 0751)  potassium chloride SA (KLOR-CON M) CR tablet 60 mEq (60 mEq Oral Given 12/02/21 0818)  magnesium oxide (MAG-OX) tablet 400 mg (400  mg Oral Given 12/02/21 1937)    ED Course/ Medical Decision Making/ A&P                           Medical Decision Making Amount and/or Complexity of Data Reviewed Labs: ordered. Radiology: ordered.  Risk OTC drugs. Prescription drug management.   Patient presents to the ED for concern of shortness of breath. The emergent differential diagnosis for shortness of breath includes, but is not limited to, Pulmonary edema, bronchoconstriction, Pneumonia, Pulmonary embolism, Pneumotherax/ Hemothorax, Dysrythmia, ACS.  All of these were considered throughout the evaluation the patient.  Co morbidities that complicate the patient evaluation include: End-stage renal disease on peritoneal dialysis, congestive heart failure, MR and HIV status  Additional history obtained from internal and external records:  -I reviewed patient's internal and external records.  Patient had a cardiac echo performed in February of last year where his EF was 31. -Also reviewed patient's BNP's and it appears he has not had a heart failure exacerbation anytime recently. -Additionally, no recorded history of A-fib.  He reports that most people in his family have required a pacemaker for hypertrophic cardiomyopathy but he has no history of it.  Appears he was supposed to have an echo on 1/18 but tells me that he missed this appointment.  Workup:  Lab Tests:  I Ordered, and personally interpreted labs.   The pertinent results include:  Potassium 2.4 Magnesium 1.6 Phosphorus 7.1 Hyponatremia 129 Creatinine 19.43 and GFR 3, around patient's baseline. Negative BNP  2. Imaging Studies ordered:  I ordered imaging studies including chest x-ray. I independently visualized and interpreted these and agree with the radiologist's read of no signs of pulmonary edema or acute pulmonary process causing patient's shortness of breath.  3. Cardiac Monitoring:  The patient was maintained on a cardiac monitor.  I personally viewed  and interpreted the cardiac monitored which showed atrial fibrillation with a rapid rate.  Occasionally would convert to sinus, but then back to A-fib RVR.  After initiation of IVF and Cardizem, patient popped back into sinus rhythm with a regular rate.  4. Test Considered: Troponin, however this is likely to be high due to patient's ESRD.  He is also denying any chest pain and EKG/heart monitor not consistent with MI.  5. Critical Interventions: Initiation of IVF and Cardizem to bring down patient's heart rate.  Eliquis needed for anticoagulation.  6. Consultations Obtained:  I requested consultation with the nephrology,  Dr. Jonnie Finner, and discussed lab and imaging findings as well as pertinent plan - they agreed to follow the patient in his daily peritoneal dialysis.  Recommended treatment for hypokalemia and hypomagnesemia.  Agreed with 5 mg dosage of Eliquis due to A-fib.   Treatment:  Medications:  I ordered medication including Cardizem load and infusion for A-fib RVR. On reevalation, patient stated that he was no longer short of breath.  Continues to be in A-fib with a normal rate. I have reviewed the patients home medicines and have consulted pharmacy to do med rec.  Patient does not appear to be fully adherent to his chronic  regimens.  -Patient was given a Cardizem load and infusion and Eliquis due to his A-fib RVR, likely secondary to electrolyte abnormalities. -Also given oral and IV potassium due to his hypokalemia and oral magnesium for his hypomagnesemia  Dispo:  Problem List / ED Course:  Runs of afib, malnutrition, end-stage renal disease, hypokalemia, hypomagnesemia  Dispostion:  After the interventions noted above, I reevaluated the patient and found that they have : Improved, denying any shortness of breath.  9am: Currently in sinus rhythm with a normal rate.  Frequent PVCs.  Satting at 100% on room air.  After consideration of the diagnostic results and the patients  response to treatment, I feel that the patent would benefit from admission for his electrolyte abnormalities, varying heart rhythm and dehydration.  Dr. Jonnie Finner with nephrology will follow the patient.  Final Clinical Impression(s) / ED Diagnoses Final diagnoses:  Shortness of breath  Hypokalemia  Hypomagnesemia  Hyperphosphatemia  ESRD on peritoneal dialysis [facility])  Atrial fibrillation, unspecified type [facility])    Rx / DC Orders Admit to internal medicine team, Dr. Georgana Curio, Tareka Jhaveri A, PA-C 12/02/21 7588    Godfrey Pick, MD 12/03/21 1515

## 2021-12-03 DIAGNOSIS — I4891 Unspecified atrial fibrillation: Secondary | ICD-10-CM

## 2021-12-03 DIAGNOSIS — Z992 Dependence on renal dialysis: Secondary | ICD-10-CM

## 2021-12-03 DIAGNOSIS — E876 Hypokalemia: Principal | ICD-10-CM

## 2021-12-03 DIAGNOSIS — N186 End stage renal disease: Secondary | ICD-10-CM

## 2021-12-03 LAB — CBC
HCT: 29.8 % — ABNORMAL LOW (ref 39.0–52.0)
Hemoglobin: 10.7 g/dL — ABNORMAL LOW (ref 13.0–17.0)
MCH: 35.3 pg — ABNORMAL HIGH (ref 26.0–34.0)
MCHC: 35.9 g/dL (ref 30.0–36.0)
MCV: 98.3 fL (ref 80.0–100.0)
Platelets: 237 10*3/uL (ref 150–400)
RBC: 3.03 MIL/uL — ABNORMAL LOW (ref 4.22–5.81)
RDW: 14 % (ref 11.5–15.5)
WBC: 6.9 10*3/uL (ref 4.0–10.5)
nRBC: 0 % (ref 0.0–0.2)

## 2021-12-03 LAB — BASIC METABOLIC PANEL
Anion gap: 15 (ref 5–15)
BUN: 25 mg/dL — ABNORMAL HIGH (ref 6–20)
CO2: 27 mmol/L (ref 22–32)
Calcium: 9.4 mg/dL (ref 8.9–10.3)
Chloride: 93 mmol/L — ABNORMAL LOW (ref 98–111)
Creatinine, Ser: 11.07 mg/dL — ABNORMAL HIGH (ref 0.61–1.24)
GFR, Estimated: 5 mL/min — ABNORMAL LOW (ref >60)
Glucose, Bld: 108 mg/dL — ABNORMAL HIGH (ref 70–99)
Potassium: 3.1 mmol/L — ABNORMAL LOW (ref 3.5–5.1)
Sodium: 135 mmol/L (ref 135–145)

## 2021-12-03 LAB — MAGNESIUM: Magnesium: 1.9 mg/dL (ref 1.7–2.4)

## 2021-12-03 LAB — HEMOGLOBIN A1C
Hgb A1c MFr Bld: 5.2 % (ref 4.8–5.6)
Mean Plasma Glucose: 102.54 mg/dL

## 2021-12-03 LAB — HEPATITIS B SURFACE ANTIBODY, QUANTITATIVE: Hep B S AB Quant (Post): <3.1 m[IU]/mL — ABNORMAL LOW (ref >9.9)

## 2021-12-03 MED ORDER — ONDANSETRON HCL 4 MG/2ML IJ SOLN: 4.0000 mg | Freq: Once | INTRAMUSCULAR | Status: DC

## 2021-12-03 MED ORDER — ONDANSETRON HCL 4 MG/2ML IJ SOLN
INTRAMUSCULAR | Status: AC
  Administered 2021-12-03: 4 mg via INTRAVENOUS
  Filled 2021-12-03: qty 2

## 2021-12-03 MED ORDER — ONDANSETRON HCL 4 MG/2ML IJ SOLN
4.0000 mg | Freq: Once | INTRAMUSCULAR | Status: AC | PRN
  Administered 2021-12-03: 4 mg via INTRAVENOUS
  Filled 2021-12-03: qty 2

## 2021-12-03 NOTE — Progress Notes (Signed)
Case discussed with renal PA this afternoon. Pt to likely need HD at d/c but navigator to f/u with nephrology staff tomorrow for final determination.   Melven Sartorius Renal Navigator [phone]

## 2021-12-03 NOTE — Progress Notes (Signed)
[facility] Progress Note   Subjective:   Patient seen and examined at bedside during dialysis.  Tolerating treatment well so far.  Continues to have nausea and vomiting, last vomited this AM around 5.  Denies CP, SOB, abdominal pain, dizziness and fatigue.  Admits to occasional "flutter" in his chest.    Objective Vitals:   12/03/21 0500 12/03/21 0653 12/03/21 0851 12/03/21 0902  BP: (!) 88/72 102/67 112/65 111/61  Pulse: (!) 25 84 76 78  Resp: 19 18 18 16   Temp:   98.2 F (36.8 C)   TempSrc:   Oral   SpO2: 97% 98% 98%   Weight:   89 kg   Height:       Physical Exam General:well appearing male in NAD Heart:RRR, no mrg Lungs:CTAB, nml WOB on RA Abdomen:soft, NTND, LLQ PD cath Extremities:no LE edema Dialysis Access: LU AVF in use   [facility] Weights   12/02/21 0628 12/03/21 0851  Weight: 85.5 kg 89 kg    Intake/Output Summary (Last 24 hours) at 12/03/2021 0931 Last data filed at 12/02/2021 1800 Gross per 24 hour  Intake 600 ml  Output 1700 ml  Net -1100 ml    Additional Objective Labs: Basic Metabolic Panel: Recent Labs  Lab 12/02/21 0645 12/02/21 0725 12/03/21 0323  NA 129*  --  135  K 2.4*  --  3.1*  CL 84*  --  93*  CO2 19*  --  27  GLUCOSE 138*  --  108*  BUN 53*  --  25*  CREATININE 19.43*  --  11.07*  CALCIUM 10.2  --  9.4  PHOS  --  7.1*  --    CBC: Recent Labs  Lab 12/02/21 0645 12/03/21 0323  WBC 9.2 6.9  HGB 13.6 10.7*  HCT 37.2* 29.8*  MCV 95.9 98.3  PLT 359 237   Blood Culture    Component Value Date/Time   SDES URINE, CLEAN CATCH 09/21/2021 2211   Casa 09/21/2021 2211   CULT  09/21/2021 2211    NO GROWTH Performed at [facility], [place] [address], [place], [place] [address]    REPTSTATUS 09/23/2021 FINAL 09/21/2021 2211    Cardiac Enzymes: No results for input(s): CKTOTAL, CKMB, CKMBINDEX, TROPONINI in the last 168 hours. CBG: No results for input(s): GLUCAP in the last 168 hours. Iron Studies:  No results for input(s): IRON, TIBC, TRANSFERRIN, FERRITIN in the last 72 hours. Lab Results  Component Value Date   INR 1.2 06/17/2021   INR 1.06 07/15/2017   INR 1.05 06/07/2017   Studies/Results: DG Chest Portable 1 View  Result Date: 12/02/2021 CLINICAL DATA:  56 year old male with history of shortness of breath. EXAM: PORTABLE CHEST 1 VIEW COMPARISON:  Chest x-ray 10/10/2021. FINDINGS: Elevation of the right hemidiaphragm, new compared to the prior examination. Lung volumes are normal. No consolidative airspace disease. No pleural effusions. No pneumothorax. No pulmonary nodule or mass noted. Pulmonary vasculature is normal. Heart size is mildly enlarged. Upper mediastinal contours are within normal limits. IMPRESSION: 1. New elevation of the right hemidiaphragm is of uncertain etiology and significance. 2. Mild cardiomegaly. Electronically Signed   By: Vinnie Langton M.D.   On: 12/02/2021 07:10    Medications:   allopurinol  100 mg Oral BID   amLODipine  10 mg Oral Daily   apixaban  5 mg Oral BID   aspirin EC  81 mg Oral Daily   bictegravir-emtricitabine-tenofovir AF  1 tablet Oral Daily   budesonide (  PULMICORT) nebulizer solution  0.25 mg Nebulization BID   calcitRIOL  0.5 mcg Oral Q M,W,F   Chlorhexidine Gluconate Cloth  6 each Topical Q0600   cinacalcet  30 mg Oral QPM   gabapentin  300 mg Oral QHS   levothyroxine  25 mcg Oral QAC breakfast   metoprolol tartrate  12.5 mg Oral BID   pantoprazole  40 mg Oral QHS   rOPINIRole  1 mg Oral QHS   rosuvastatin  10 mg Oral q morning    Dialysis Orders: 6 exchanges, 2500 fill vol, dwell time 1.5h, 88kg     Assessment/ Plan: Generalized weakness/ N/V - w/ loss of appetite, progressive fatigue and weakness w/ ambulation issues. Creat up to 20 now, it was 9 when he started PD in Aug 2022.  Suspect he is failing PD, will plan to do HD today and tomorrow and see if he feels any better. No signs of infection.  Labs improving, continues  to have symptoms for now, follow.  Hypokalemia - K 2.4 on admit, improved to 3.1 this AM.  Using increased K bath.  ESRD - was on HD 2.5 yrs then switched to PD in Aug 2022. Possibly failing PD and may need to transition back to HD.  We reassess after additional HD and discuss with patient.  BP/ vol - BP's improved this AM. Holding home meds except metoprolol for rate control.  CXR is clear w/o edema.  Appears euvolemic on exam, no UF with HD today.  Afib/ RVR - on PO metoprolol.   HIV Hep B Non obstructive CAD/Mod-severe MR, Diastolic HF - chronic.   Jen Mow, PA-C [facility] 12/03/2021,9:31 AM  LOS: 1 day

## 2021-12-03 NOTE — Care Management (Signed)
°  Transition of Care [facility]) Screening Note   Patient Details  Name: [name] Date of Birth: [birth year]   Transition of Care [facility]) CM/SW Contact:    Carles Collet, RN Phone Number: 12/03/2021, 3:47 PM    Transition of Care Department [facility]) has reviewed patient and we will continue to monitor patient advancement through interdisciplinary progression rounds. If new patient transition needs arise, please place a TOC consult.

## 2021-12-03 NOTE — ED Notes (Signed)
Pt reporting nausea, vomited x1

## 2021-12-03 NOTE — Progress Notes (Signed)
HD#1 SUBJECTIVE:  Patient Summary: Albert Eaton is a 56 y.o. 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  Overnight Events: Patient had nausea with vomiting overnight.   Interim History: Patient feels better with antiemetic therapy after overnight vomiting. He is upset that he is still in a room in the ED. He states that he intends to leave today.  OBJECTIVE:  Vital Signs: Vitals:   12/03/21 0315 12/03/21 0400 12/03/21 0500 12/03/21 0653  BP: 91/66 (!) 87/53 (!) 88/72 102/67  Pulse: 84 84 (!) 25 84  Resp: 11 12 19 18   Temp:      TempSrc:      SpO2: 99% 97% 97% 98%  Weight:      Height:       Supplemental O2: Room Air SpO2: 98 % O2 Flow Rate (L/min): 0 L/min  Filed Weights   12/02/21 0628  Weight: 85.5 kg     Intake/Output Summary (Last 24 hours) at 12/03/2021 0658 Last data filed at 12/02/2021 1800 Gross per 24 hour  Intake 600 ml  Output 1700 ml  Net -1100 ml   Net IO Since Admission: -1,100 mL [12/03/21 0658]  Physical Exam: Constitutional: No acute distress, resting comfortably in bed. Cardio: Regular rate and rhythm. Pulm: Clear to auscultation bilaterally. Normal work of breathing on room air. Abdomen: Peritoneal dialysis catheter over mid-R abdomen MSK: Negative for extremity edema. AV fistula to LUE with bruit. Skin: Warm and dry. Neuro: Alert and oriented x3, no focal deficit noted. Psych: Normal mood and affect.  Patient Lines/Drains/Airways Status     Active Line/Drains/Airways     Name Placement date Placement time Site Days   Peripheral IV 12/02/21 20 G 1.16" Anterior;Right Forearm 12/02/21  0812  Forearm  1   Fistula / Graft Left Forearm Arteriovenous fistula 07/02/18  0850  Forearm  1250   Fistula / Graft Left Upper arm Arteriovenous fistula 08/27/18  1108  Upper arm  1194   Incision (Closed) 07/15/17 Flank  Left 07/15/17  0855  -- 1602   Incision (Closed) 07/02/18 Arm Left 07/02/18  0757  -- 1250   Incision (Closed) 08/27/18 Arm Left 08/27/18  1134  -- 1194   Incision (Closed) 05/30/21 Abdomen 05/30/21  0817  -- 187             ASSESSMENT/PLAN:  Assessment: Principal Problem:   Hypokalemia Active Problems:   Hypertensive heart disease with CHF (congestive heart failure) (HCC)   ESRD (end stage renal disease) on dialysis (HCC)   Atrial fibrillation with RVR (HCC)   Plan: Atrial tachycardia with RVR, PAF CHADS2VASC Score 2 Patient has been unable to tolerate adequate PO intake due to nausea and vomiting, including metoprolol prior to admission. Patient has been tolerating metoprolol 12.5 mg PO BID at this time. -Continue Eliquis 5 mg BID -Telemetry  Hx CAD Patient with CAD managed at home on amlodipine 10 mg daily, metoprolol 12.5 mg BID, aspirin 81 mg daily, hydralazine 100 TID. Patient has been largely hypotensive during this stay and has additional HD sessions 02/06, 02/07. -Hold home hydralazine -Amlodipine discontinued 2/2 hypotension -Metoprolol 12.5 mg BID with BP, HR parameters  Hx HLD Continue home Crestor 10 mg daily   ESRD on peritoneal dialysis FSGS 2/2 HIV Electrolyte derangements Patient has been on PD for roughly 4 months. Unfortunately it does not  appear that he is sufficiently dialyzed at this time. Hyperkalemia persistent at 3.1, magnesium 1.9. S/p HD 02/06. -HD 02/06, 02/07   HIV Neuropathy Chronically managed with biktarvy and gabapentin, which are continued at this time.   Hypothyroidism Continue synthroid 25 mcg daily.   Gout Chronic and stable. Continue allopurinol 100 mg BID.   GERD Continue protonix.  Best Practice: Diet: Renal diet, fluid restriction 1200 mL IVF: None Code: Full AB: None DISPO: Anticipated discharge in 1-2 days to Home pending Medical stability and Hemodialysis.  Signature: Farrel Gordon, D.O.  Internal Medicine  Resident, PGY-1 [facility]  Pager: [phone] 6:58 AM, 12/03/2021   Please contact the on call pager after 5 pm and on weekends at [phone].

## 2021-12-03 NOTE — ED Notes (Signed)
Breakfast Orders Placed °

## 2021-12-04 ENCOUNTER — Other Ambulatory Visit (HOSPITAL_COMMUNITY): Payer: Self-pay

## 2021-12-04 DIAGNOSIS — Z79899 Other long term (current) drug therapy: Secondary | ICD-10-CM | POA: Diagnosis not present

## 2021-12-04 DIAGNOSIS — N2581 Secondary hyperparathyroidism of renal origin: Secondary | ICD-10-CM | POA: Diagnosis not present

## 2021-12-04 DIAGNOSIS — R82998 Other abnormal findings in urine: Secondary | ICD-10-CM | POA: Diagnosis not present

## 2021-12-04 DIAGNOSIS — Z992 Dependence on renal dialysis: Secondary | ICD-10-CM | POA: Diagnosis not present

## 2021-12-04 DIAGNOSIS — Z4932 Encounter for adequacy testing for peritoneal dialysis: Secondary | ICD-10-CM | POA: Diagnosis not present

## 2021-12-04 DIAGNOSIS — D631 Anemia in chronic kidney disease: Secondary | ICD-10-CM | POA: Diagnosis not present

## 2021-12-04 DIAGNOSIS — N186 End stage renal disease: Secondary | ICD-10-CM | POA: Diagnosis not present

## 2021-12-04 DIAGNOSIS — D509 Iron deficiency anemia, unspecified: Secondary | ICD-10-CM | POA: Diagnosis not present

## 2021-12-04 LAB — RENAL FUNCTION PANEL
Albumin: 3.3 g/dL — ABNORMAL LOW (ref 3.5–5.0)
Anion gap: 11 (ref 5–15)
BUN: 17 mg/dL (ref 6–20)
CO2: 27 mmol/L (ref 22–32)
Calcium: 8.2 mg/dL — ABNORMAL LOW (ref 8.9–10.3)
Chloride: 95 mmol/L — ABNORMAL LOW (ref 98–111)
Creatinine, Ser: 8.04 mg/dL — ABNORMAL HIGH (ref 0.61–1.24)
GFR, Estimated: 7 mL/min — ABNORMAL LOW (ref >60)
Glucose, Bld: 113 mg/dL — ABNORMAL HIGH (ref 70–99)
Phosphorus: 2.7 mg/dL (ref 2.5–4.6)
Potassium: 3 mmol/L — ABNORMAL LOW (ref 3.5–5.1)
Sodium: 133 mmol/L — ABNORMAL LOW (ref 135–145)

## 2021-12-04 MED ORDER — POTASSIUM CHLORIDE CRYS ER 10 MEQ PO TBCR: 30.0000 meq | EXTENDED_RELEASE_TABLET | Freq: Two times a day (BID) | ORAL | 0 refills | Status: DC

## 2021-12-04 MED ORDER — APIXABAN 5 MG PO TABS: 5.0000 mg | ORAL_TABLET | Freq: Two times a day (BID) | ORAL | 0 refills | Status: DC

## 2021-12-04 NOTE — Discharge Instructions (Addendum)
You were admitted to the hospital for symptoms that are likely due to insufficient cleaning of your blood with PD, leading to a toxic buildup of chemicals in your blood. You are likely feeling better today because you have been undergoing hemodialysis which is able to clean your blood better. Please be sure to have ongoing discussions with your nephrology team regarding your symptoms. If your symptoms worsen again, please contact your nephrologist office. If you are unable to reach them, please seek further evaluation in the emergency room again.   Blood pressure management -Measure your blood pressure in the morning. If your blood pressure is less than 110/70, do not take amlodipine.   Low potassium -Your potassium has been low since you were admitted. You have been having this replaced via hemodialysis. I have sent in a few day supply of potassium supplements to the pharmacy. You will need close follow up with your nephrologist to monitor this as well as to monitor your symptoms.   Atrial fibrillation -Atrial fibrillation is an abnormal heart rhythm that increases your risk for blood clots. We have started a new medication called Eliquis to reduce your risk for this. Please review the attached information regarding this.  Information on my medicine - ELIQUIS (apixaban)  Why was Eliquis prescribed for you? Eliquis was prescribed for you to reduce the risk of a blood clot forming that can cause a stroke if you have a medical condition called atrial fibrillation (a type of irregular heartbeat).  What do You need to know about Eliquis ? Take your Eliquis TWICE DAILY - one tablet in the morning and one tablet in the evening with or without food. If you have difficulty swallowing the tablet whole please discuss with your pharmacist how to take the medication safely.  Take Eliquis exactly as prescribed by your doctor and DO NOT stop taking Eliquis without talking to the doctor who prescribed the  medication.  Stopping may increase your risk of developing a stroke.  Refill your prescription before you run out.  After discharge, you should have regular check-up appointments with your healthcare provider that is prescribing your Eliquis.  In the future your dose may need to be changed if your kidney function or weight changes by a significant amount or as you get older.  What do you do if you miss a dose? If you miss a dose, take it as soon as you remember on the same day and resume taking twice daily.  Do not take more than one dose of ELIQUIS at the same time to make up a missed dose.  Important Safety Information A possible side effect of Eliquis is bleeding. You should call your healthcare provider right away if you experience any of the following: Bleeding from an injury or your nose that does not stop. Unusual colored urine (red or dark brown) or unusual colored stools (red or black). Unusual bruising for unknown reasons. A serious fall or if you hit your head (even if there is no bleeding).  Some medicines may interact with Eliquis and might increase your risk of bleeding or clotting while on Eliquis. To help avoid this, consult your healthcare provider or pharmacist prior to using any new prescription or non-prescription medications, including herbals, vitamins, non-steroidal anti-inflammatory drugs (NSAIDs) and supplements.  This website has more information on Eliquis (apixaban): http://www.eliquis.com/eliquis/home

## 2021-12-04 NOTE — Progress Notes (Signed)
Advised by renal PA that pt is not agreeable to starting HD and plans to discuss with nephrologist at pt's next appt. Pt to continue PD at d/c. Contacted [facility] and spoke to [place], [place] regarding the above.   Melven Sartorius Renal Navigator [phone]

## 2021-12-04 NOTE — Hospital Course (Signed)
Atrial tachycardia with RVR, PAF CHADS2VASC score of 2 Patient presented to [facility] ED with complaint of shortness of breath starting after initiating home peritoneal dialysis.  He also reported general malaise and palpitations of the last day prior to presenting, as well as nausea and vomiting.  Because of vomiting he had missed his metoprolol.  Chest x-ray was negative for pulmonary edema or acute pulmonary process.  EKG was remarkable for A-fib with RVR.  He was initiated on fluids and Cardizem which converted back to sinus rhythm with rate control.  He was transition from Cardizem drip to oral metoprolol and initiated on Eliquis.   Mod-severe MR Chronic hypertension with history of orthostasis. Orthostasis noted to improve after coreg was changed to metoprolol.  Non-obstructive CAD Hyperlipidemia Chronic conditions.  Patient was continued on Crestor and aspirin.  Hydralazine was held at admission and he was continued on home regimen of amlodipine, metoprolol.  Amlodipine was discontinued on 02/07 secondary to hypotension.     ESRD on peritoneal dialysis  On presentation he was noted to have several electrolyte derangements including hypokalemia, hypomagnesemia, hypophosphatemia, hyponatremia with creatinine of 19.43 and GFR of 3 with negative BMP.  Patient underwent HD 02/06, 02/07 with adjustments made during dialysis session for electrolyte derangements.   New right hemidiaphragm Incidental finding with unclear significance or etiology.   HIV with neuropathy.  Chronic condition.  Continued home biktarvy and gabapentin.   Hypothyroidism Chronic condition.  Continued home synthroid.   Gout Chronic condition.  Continued home allopurinol   GERD Chronic condition.  Continued home protonix.

## 2021-12-04 NOTE — Progress Notes (Signed)
[facility] Progress Note   Subjective:   Patient seen and examined at bedside.  States he continue to have nausea and vomiting.  Feeling a little better but overall says symptoms are the same.  Denies CP, SOB and abdominal pain.  Reports he does not want to start outpatient HD, "I hate it".  Does not have anyone to help with home HD, which is why he switched to PD in the Fall.  Also states he is ready to go.  "I am getting ready to call the nurse when you walk out of this room to go home."  States he can not stay here anymore.  He feels confined in small spaces.  Agreed to stay until he can be discharged.  He would like to continue PD for now at home and follow up with Dr Hollie Salk about the next steps.  Reports he has an appointment with her on 12/18/21.   Objective Vitals:   12/03/21 2102 12/03/21 2322 12/04/21 0314 12/04/21 0743  BP:  101/74 101/70 99/63  Pulse:  80 90   Resp:  20 18   Temp:  98 F (36.7 C) 98.2 F (36.8 C) 98.5 F (36.9 C)  TempSrc:  Oral Oral Oral  SpO2: 96% 97% 100%   Weight:      Height:       Physical Exam General:well appearing male in NAD Heart:RRR, no mrg Lungs:CTAB, nml WOB Abdomen:soft, NTND Extremities:no LE edema Dialysis Access: LU AVF +b/t, PD catheter LLQ  Filed Weights   12/02/21 0628 12/03/21 0851 12/03/21 1230  Weight: 85.5 kg 89 kg 89 kg    Intake/Output Summary (Last 24 hours) at 12/04/2021 1138 Last data filed at 12/03/2021 1939 Gross per 24 hour  Intake 477 ml  Output 0 ml  Net 477 ml    Additional Objective Labs: Basic Metabolic Panel: Recent Labs  Lab 12/02/21 0645 12/02/21 0725 12/03/21 0323 12/04/21 0821  NA 129*  --  135 133*  K 2.4*  --  3.1* 3.0*  CL 84*  --  93* 95*  CO2 19*  --  27 27  GLUCOSE 138*  --  108* 113*  BUN 53*  --  25* 17  CREATININE 19.43*  --  11.07* 8.04*  CALCIUM 10.2  --  9.4 8.2*  PHOS  --  7.1*  --  2.7   Liver Function Tests: Recent Labs  Lab 12/04/21 0821  ALBUMIN 3.3*    CBC: Recent Labs  Lab 12/02/21 0645 12/03/21 0323  WBC 9.2 6.9  HGB 13.6 10.7*  HCT 37.2* 29.8*  MCV 95.9 98.3  PLT 359 237   Medications:   allopurinol  100 mg Oral BID   apixaban  5 mg Oral BID   aspirin EC  81 mg Oral Daily   bictegravir-emtricitabine-tenofovir AF  1 tablet Oral Daily   budesonide (PULMICORT) nebulizer solution  0.25 mg Nebulization BID   calcitRIOL  0.5 mcg Oral Q M,W,F   Chlorhexidine Gluconate Cloth  6 each Topical Q0600   cinacalcet  30 mg Oral QPM   gabapentin  300 mg Oral QHS   levothyroxine  25 mcg Oral QAC breakfast   metoprolol tartrate  12.5 mg Oral BID   pantoprazole  40 mg Oral QHS   rOPINIRole  1 mg Oral QHS   rosuvastatin  10 mg Oral q morning    Dialysis Orders: 6 exchanges, 2500 fill vol, dwell time 1.5h, 88kg     Assessment/ Plan: Generalized weakness/ N/V -  w/ loss of appetite, progressive fatigue and weakness w/ ambulation issues. Creat up to 20 now, it was 9 when he started PD in Aug 2022.  Reports little improvement in symptoms after 2 HD.  Hypokalemia - K 2.4 on admit, 3.0 this AM.  Using increased K bath. Started on K supplement 75mq qd.  ESRD - was on HD 2.5 yrs then switched to PD in Aug 2022. Appears to be failing PD but not willing to commit to HD, because he hates going in center. Requesting to continue PD for now and follow up with Dr. UHollie Salkabout what to do next.  Dr. UHollie Salkaware. BP/ vol - BP's low normal this AM.  Holding home meds except metoprolol for rate control.  CXR is clear w/o edema.  Appears euvolemic on exam, no UF with HD today.  Afib/ RVR - on PO metoprolol.   HIV Hep B Non obstructive CAD/Mod-severe MR, Diastolic HF - chronic.   LJen Mow PA-C [facility] 12/04/2021,11:38 AM  LOS: 2 days

## 2021-12-04 NOTE — TOC Benefit Eligibility Note (Signed)
Patient Teacher, English as a foreign language completed.    The patient is currently admitted and upon discharge could be taking Eliquis 5 mg.  The current 30 day co-pay is, $0.00.   The patient is currently admitted and upon discharge could be taking Xarelto 20 mg.  The current 30 day co-pay is, $0.00.   The patient is insured through [place], [place] Patient Advocate Specialist [place] Patient Advocate Team Direct Number: [phone]  Fax: [phone]

## 2021-12-04 NOTE — Discharge Summary (Signed)
Name: Albert Eaton MRN: [id number] DOB: 1966-05-28 56 y.o. PCP: Albert Merino I, NP  Date of Admission: 12/02/2021  6:26 AM Date of Discharge: 12/04/2021  Attending Physician: Albert Eaton  DISCHARGE DIAGNOSIS:  Primary Problem: Hypokalemia   Hospital Problems: Principal Problem:   Hypokalemia Active Problems:   Hypertensive heart disease with CHF (congestive heart failure) (HCC)   ESRD (end stage renal disease) on dialysis [facility])   Atrial fibrillation with RVR ([place])    DISCHARGE MEDICATIONS:   Allergies as of 12/04/2021       Reactions   Ace Inhibitors Cough, Other (See Comments)   Lisinopril Cough        Medication List     STOP taking these medications    azelastine 0.1 % nasal spray Commonly known as: ASTELIN   capsaicin-methyl sal-menthol 0.025-1-12 % Crea Generic drug: Capsaicin-Menthol-Methyl Sal   clonazePAM 0.5 MG tablet Commonly known as: KlonoPIN   clotrimazole-betamethasone cream Commonly known as: LOTRISONE   gentamicin cream 0.1 % Commonly known as: GARAMYCIN   hydrALAZINE 100 MG tablet Commonly known as: APRESOLINE   methocarbamol 500 MG tablet Commonly known as: ROBAXIN       TAKE these medications    Acetaminophen Extra Strength 500 MG tablet Generic drug: acetaminophen Take 500 mg by mouth every 6 (six) hours as needed (pain.).   allopurinol 100 MG tablet Commonly known as: ZYLOPRIM TAKE 1 TABLET(100 MG) BY MOUTH TWICE DAILY FOR 60 DOSES What changed: See the new instructions.   amLODipine 10 MG tablet Commonly known as: NORVASC Take 1 tablet (10 mg total) by mouth daily. What changed: when to take this   apixaban 5 MG Tabs tablet Commonly known as: ELIQUIS Take 1 tablet (5 mg total) by mouth 2 (two) times daily.   aspirin EC 81 MG tablet Take 1 tablet (81 mg total) by mouth daily. Swallow whole. What changed: when to take this   Biktarvy 50-200-25 MG Tabs tablet Generic drug: bictegravir-emtricitabine-tenofovir  AF TAKE 1 TABLET BY MOUTH DAILY What changed:  how to take this when to take this   calcitRIOL 0.5 MCG capsule Commonly known as: ROCALTROL Take 0.5 mcg by mouth every Monday, Wednesday, and Friday.   chlorproMAZINE 10 MG tablet Commonly known as: THORAZINE Take 1 tablet (10 mg total) by mouth 3 (three) times daily as needed for hiccoughs. What changed: when to take this   cinacalcet 30 MG tablet Commonly known as: SENSIPAR Take 30 mg by mouth every evening.   clobetasol 0.05 % topical foam Commonly known as: OLUX Apply topically 2 (two) times daily.   docusate sodium 100 MG capsule Commonly known as: COLACE Take 100 mg by mouth 2 (two) times daily.   famotidine 20 MG tablet Commonly known as: PEPCID Take 1 tablet (20 mg total) by mouth 2 (two) times daily. Please keep upcoming appt. With Cardilologist in order to receive further refills. Thank you.   ferric citrate 1 GM 210 MG(Fe) tablet Commonly known as: AURYXIA Take 210 mg by mouth daily with lunch.   Flovent HFA 110 MCG/ACT inhaler Generic drug: fluticasone INHALE 2 PUFFS INTO THE LUNGS TWICE DAILY What changed: when to take this   gabapentin 300 MG capsule Commonly known as: NEURONTIN TAKE 1 CAPSULE(300 MG) BY MOUTH THREE TIMES DAILY What changed: See the new instructions.   hyoscyamine 0.125 MG SL tablet Commonly known as: LEVSIN SL Place 1-2 tablets (0.125-0.25 mg total) under the tongue every 8 (eight) hours as needed (lower abdominal  pain, spasm).   ipratropium 0.06 % nasal spray Commonly known as: ATROVENT USE 2 SPRAYS IN EACH NOSTRIL 2 TO 3 TIMES DAILY AS NEEDED What changed:  how much to take how to take this when to take this reasons to take this additional instructions   lactulose 10 GM/15ML solution Commonly known as: CHRONULAC Take 20 g by mouth daily as needed for moderate constipation.   levothyroxine 25 MCG tablet Commonly known as: SYNTHROID TAKE 1 TABLET BY MOUTH DAILY BEFORE  BREAKFAST   lubiprostone 24 MCG capsule Commonly known as: AMITIZA TAKE 1 CAPSULE(24 MCG) BY MOUTH TWICE DAILY WITH A MEAL What changed: See the new instructions.   metoprolol tartrate 25 MG tablet Commonly known as: LOPRESSOR Take 0.5 tablets (12.5 mg total) by mouth 2 (two) times daily.   MIRCERA IJ Inject 1 each into the skin every 30 (thirty) days. Administered at dialysis center once a month   multivitamin Tabs tablet Take 1 tablet by mouth at bedtime.   pantoprazole 40 MG tablet Commonly known as: PROTONIX TAKE 1 TABLET(40 MG) BY MOUTH DAILY What changed: See the new instructions.   Pazeo 0.7 % Soln Generic drug: Olopatadine HCl Place 1 drop into both eyes daily. What changed:  when to take this reasons to take this   potassium chloride 10 MEQ tablet Commonly known as: KLOR-CON M Take 3 tablets (30 mEq total) by mouth 2 (two) times daily for 3 days.   ProAir HFA 108 (90 Base) MCG/ACT inhaler Generic drug: albuterol INHALE 2 PUFFS INTO THE LUNGS EVERY 4 HOURS AS NEEDED FOR WHEEZING OR SHORTNESS OF BREATH What changed: how to take this   rOPINIRole 1 MG tablet Commonly known as: Requip Take 1 tablet (1 mg total) by mouth at bedtime.   rosuvastatin 10 MG tablet Commonly known as: CRESTOR Take 10 mg by mouth every morning.   Spacer/Aero-Holding Dorise Bullion 1 Device by Does not apply route 2 (two) times a day.   Vitamin D (Ergocalciferol) 1.25 MG (50000 UNIT) Caps capsule Commonly known as: DRISDOL TAKE 1 CAPSULE BY MOUTH EVERY 7 DAYS What changed:  when to take this additional instructions        DISPOSITION AND FOLLOW-UP:  AlbertAlbert Eaton was discharged from [facility] in Center condition. At the hospital follow up visit please address:  Atrial tachycardia with RVR, PAF CHADS2VASC score of 2 Recommend repeat EKG to ensure patient continues to be in sinus rhythm.  He was also started on Eliquis during this admission and will need  to be followed to ensure appropriate dosing and that he is tolerating the medication well.    Non-obstructive CAD Hyperlipidemia Patient advised to discontinue hydralazine.  He has been counseled on taking his blood pressure prior to taking amlodipine, and if the systolic blood pressures less than 110 he is not to take amlodipine.  He was continued on metoprolol.  Would address previous antihypertensive regimen as it may no longer be appropriate for patient.   ESRD on peritoneal dialysis  Patient will need close follow-up with nephrology regarding possibly needing to revert to hemodialysis.     New right hemidiaphragm Consider reimaging.  Follow-up Recommendations: Consults: Nephrology Labs: CBC, Comprehensive Metabolic Panel, LDL Cholesterol, and TSH Studies: EKG Medications: Eliquis 5 mg twice daily new at discharge.  Patient also advised to stop taking hydralazine and check blood pressure before taking amlodipine, holding the medication if his systolic blood pressures less than 110.  Follow-up Appointments:   Patient  will need to follow-up with his nephrologist as well as primary care provider.  HOSPITAL COURSE:  Patient Summary: Atrial tachycardia with RVR, PAF CHADS2VASC score of 2 Patient presented to [facility] ED with complaint of shortness of breath starting after initiating home peritoneal dialysis.  He also reported general malaise and palpitations of the last day prior to presenting, as well as nausea and vomiting.  Because of vomiting he had missed his metoprolol.  Chest x-ray was negative for pulmonary edema or acute pulmonary process.  EKG was remarkable for A-fib with RVR.  He was initiated on fluids and Cardizem which converted back to sinus rhythm with rate control.  He was transition from Cardizem drip to oral metoprolol and initiated on Eliquis.   Mod-severe MR Chronic hypertension with history of orthostasis. Orthostasis noted to improve after coreg was changed to  metoprolol.  Non-obstructive CAD Hyperlipidemia Chronic conditions.  Patient was continued on Crestor and aspirin.  Hydralazine was held at admission and he was continued on home regimen of amlodipine, metoprolol.  Amlodipine was discontinued on 02/07 secondary to hypotension.     ESRD on peritoneal dialysis  On presentation he was noted to have several electrolyte derangements including hypokalemia, hypomagnesemia, hypophosphatemia, hyponatremia with creatinine of 19.43 and GFR of 3 with negative BMP.  Patient underwent HD 02/06, 02/07 with adjustments made during dialysis session for electrolyte derangements.   New right hemidiaphragm Incidental finding with unclear significance or etiology.   HIV with neuropathy.  Chronic condition.  Continued home biktarvy and gabapentin.   Hypothyroidism Chronic condition.  Continued home synthroid.   Gout Chronic condition.  Continued home allopurinol   GERD Chronic condition.  Continued home protonix.   DISCHARGE INSTRUCTIONS:    SUBJECTIVE:  Patient evaluated at bedside.  He stated that he felt generally well and intended to leave today without undergoing inpatient hemodialysis, regardless of if this was [facility] or not. Discharge Vitals:   BP 99/63 (BP Location: Right Arm)    Pulse 90    Temp 98.5 F (36.9 C) (Oral)    Resp 18    Ht 5' 10"  (1.778 m)    Wt 89 kg Comment: bed   SpO2 100%    BMI 28.15 kg/m   OBJECTIVE:  Constitutional: Resting comfortably in bed.  No acute distress noted. Cardio: Regular rate and rhythm. Pulm: Clear to auscultation bilaterally.  No work of breathing on room air. Abdomen: Soft, nontender, nondistended.  PEG site without increased warmth or erythema. MSK: Negative for extremity edema. Skin: Warm and dry. Neuro: Alert oriented x3.  No focal deficit noted. Psych: Normal mood and affect.  Pertinent Labs, Studies, and Procedures:  CBC Latest Ref Rng & Units 12/03/2021 12/02/2021 10/10/2021  WBC  4.0 - 10.5 K/uL 6.9 9.2 8.0  Hemoglobin 13.0 - 17.0 g/dL 10.7(L) 13.6 10.0(L)  Hematocrit 39.0 - 52.0 % 29.8(L) 37.2(L) 29.0(L)  Platelets 150 - 400 K/uL 237 359 283    CMP Latest Ref Rng & Units 12/04/2021 12/03/2021 12/02/2021  Glucose 70 - 99 mg/dL 113(H) 108(H) 138(H)  BUN 6 - 20 mg/dL 17 25(H) 53(H)  Creatinine 0.61 - 1.24 mg/dL 8.04(H) 11.07(H) 19.43(H)  Sodium 135 - 145 mmol/L 133(L) 135 129(L)  Potassium 3.5 - 5.1 mmol/L 3.0(L) 3.1(L) 2.4(LL)  Chloride 98 - 111 mmol/L 95(L) 93(L) 84(L)  CO2 22 - 32 mmol/L 27 27 19(L)  Calcium 8.9 - 10.3 mg/dL 8.2(L) 9.4 10.2  Total Protein 6.5 - 8.1 g/dL - - -  Total  Bilirubin 0.3 - 1.2 mg/dL - - -  Alkaline Phos 38 - 126 U/L - - -  AST 15 - 41 U/L - - -  ALT 0 - 44 U/L - - -    DG Chest Portable 1 View  Result Date: 12/02/2021 CLINICAL DATA:  56 year old male with history of shortness of breath. EXAM: PORTABLE CHEST 1 VIEW COMPARISON:  Chest x-ray 10/10/2021. FINDINGS: Elevation of the right hemidiaphragm, new compared to the prior examination. Lung volumes are normal. No consolidative airspace disease. No pleural effusions. No pneumothorax. No pulmonary nodule or mass noted. Pulmonary vasculature is normal. Heart size is mildly enlarged. Upper mediastinal contours are within normal limits. IMPRESSION: 1. New elevation of the right hemidiaphragm is of uncertain etiology and significance. 2. Mild cardiomegaly. Electronically Signed   By: Vinnie Langton M.D.   On: 12/02/2021 07:10     Signed: Farrel Gordon, D.O.  Internal Medicine Resident, PGY-1 [facility]  Pager: [phone] 1:27 PM, 12/04/2021

## 2021-12-04 NOTE — Plan of Care (Signed)

## 2021-12-05 ENCOUNTER — Ambulatory Visit: Payer: Medicare Other | Admitting: Allergy & Immunology

## 2021-12-05 DIAGNOSIS — N2581 Secondary hyperparathyroidism of renal origin: Secondary | ICD-10-CM | POA: Diagnosis not present

## 2021-12-05 DIAGNOSIS — Z79899 Other long term (current) drug therapy: Secondary | ICD-10-CM | POA: Diagnosis not present

## 2021-12-05 DIAGNOSIS — R82998 Other abnormal findings in urine: Secondary | ICD-10-CM | POA: Diagnosis not present

## 2021-12-05 DIAGNOSIS — Z992 Dependence on renal dialysis: Secondary | ICD-10-CM | POA: Diagnosis not present

## 2021-12-05 DIAGNOSIS — D631 Anemia in chronic kidney disease: Secondary | ICD-10-CM | POA: Diagnosis not present

## 2021-12-05 DIAGNOSIS — Z4932 Encounter for adequacy testing for peritoneal dialysis: Secondary | ICD-10-CM | POA: Diagnosis not present

## 2021-12-05 DIAGNOSIS — D509 Iron deficiency anemia, unspecified: Secondary | ICD-10-CM | POA: Diagnosis not present

## 2021-12-05 DIAGNOSIS — N186 End stage renal disease: Secondary | ICD-10-CM | POA: Diagnosis not present

## 2021-12-06 DIAGNOSIS — R82998 Other abnormal findings in urine: Secondary | ICD-10-CM | POA: Diagnosis not present

## 2021-12-06 DIAGNOSIS — N186 End stage renal disease: Secondary | ICD-10-CM | POA: Diagnosis not present

## 2021-12-06 DIAGNOSIS — Z4932 Encounter for adequacy testing for peritoneal dialysis: Secondary | ICD-10-CM | POA: Diagnosis not present

## 2021-12-06 DIAGNOSIS — N2581 Secondary hyperparathyroidism of renal origin: Secondary | ICD-10-CM | POA: Diagnosis not present

## 2021-12-06 DIAGNOSIS — D509 Iron deficiency anemia, unspecified: Secondary | ICD-10-CM | POA: Diagnosis not present

## 2021-12-06 DIAGNOSIS — Z992 Dependence on renal dialysis: Secondary | ICD-10-CM | POA: Diagnosis not present

## 2021-12-06 DIAGNOSIS — D631 Anemia in chronic kidney disease: Secondary | ICD-10-CM | POA: Diagnosis not present

## 2021-12-06 DIAGNOSIS — Z79899 Other long term (current) drug therapy: Secondary | ICD-10-CM | POA: Diagnosis not present

## 2021-12-07 DIAGNOSIS — Z4932 Encounter for adequacy testing for peritoneal dialysis: Secondary | ICD-10-CM | POA: Diagnosis not present

## 2021-12-07 DIAGNOSIS — D631 Anemia in chronic kidney disease: Secondary | ICD-10-CM | POA: Diagnosis not present

## 2021-12-07 DIAGNOSIS — Z79899 Other long term (current) drug therapy: Secondary | ICD-10-CM | POA: Diagnosis not present

## 2021-12-07 DIAGNOSIS — N2581 Secondary hyperparathyroidism of renal origin: Secondary | ICD-10-CM | POA: Diagnosis not present

## 2021-12-07 DIAGNOSIS — Z992 Dependence on renal dialysis: Secondary | ICD-10-CM | POA: Diagnosis not present

## 2021-12-07 DIAGNOSIS — R82998 Other abnormal findings in urine: Secondary | ICD-10-CM | POA: Diagnosis not present

## 2021-12-07 DIAGNOSIS — D509 Iron deficiency anemia, unspecified: Secondary | ICD-10-CM | POA: Diagnosis not present

## 2021-12-07 DIAGNOSIS — N186 End stage renal disease: Secondary | ICD-10-CM | POA: Diagnosis not present

## 2021-12-08 DIAGNOSIS — Z4932 Encounter for adequacy testing for peritoneal dialysis: Secondary | ICD-10-CM | POA: Diagnosis not present

## 2021-12-08 DIAGNOSIS — R82998 Other abnormal findings in urine: Secondary | ICD-10-CM | POA: Diagnosis not present

## 2021-12-08 DIAGNOSIS — D631 Anemia in chronic kidney disease: Secondary | ICD-10-CM | POA: Diagnosis not present

## 2021-12-08 DIAGNOSIS — Z79899 Other long term (current) drug therapy: Secondary | ICD-10-CM | POA: Diagnosis not present

## 2021-12-08 DIAGNOSIS — D509 Iron deficiency anemia, unspecified: Secondary | ICD-10-CM | POA: Diagnosis not present

## 2021-12-08 DIAGNOSIS — Z992 Dependence on renal dialysis: Secondary | ICD-10-CM | POA: Diagnosis not present

## 2021-12-08 DIAGNOSIS — N186 End stage renal disease: Secondary | ICD-10-CM | POA: Diagnosis not present

## 2021-12-08 DIAGNOSIS — N2581 Secondary hyperparathyroidism of renal origin: Secondary | ICD-10-CM | POA: Diagnosis not present

## 2021-12-09 DIAGNOSIS — Z992 Dependence on renal dialysis: Secondary | ICD-10-CM | POA: Diagnosis not present

## 2021-12-09 DIAGNOSIS — D631 Anemia in chronic kidney disease: Secondary | ICD-10-CM | POA: Diagnosis not present

## 2021-12-09 DIAGNOSIS — N186 End stage renal disease: Secondary | ICD-10-CM | POA: Diagnosis not present

## 2021-12-09 DIAGNOSIS — R82998 Other abnormal findings in urine: Secondary | ICD-10-CM | POA: Diagnosis not present

## 2021-12-09 DIAGNOSIS — Z4932 Encounter for adequacy testing for peritoneal dialysis: Secondary | ICD-10-CM | POA: Diagnosis not present

## 2021-12-09 DIAGNOSIS — Z79899 Other long term (current) drug therapy: Secondary | ICD-10-CM | POA: Diagnosis not present

## 2021-12-09 DIAGNOSIS — N2581 Secondary hyperparathyroidism of renal origin: Secondary | ICD-10-CM | POA: Diagnosis not present

## 2021-12-09 DIAGNOSIS — D509 Iron deficiency anemia, unspecified: Secondary | ICD-10-CM | POA: Diagnosis not present

## 2021-12-10 DIAGNOSIS — Z79899 Other long term (current) drug therapy: Secondary | ICD-10-CM | POA: Diagnosis not present

## 2021-12-10 DIAGNOSIS — Z992 Dependence on renal dialysis: Secondary | ICD-10-CM | POA: Diagnosis not present

## 2021-12-10 DIAGNOSIS — N186 End stage renal disease: Secondary | ICD-10-CM | POA: Diagnosis not present

## 2021-12-10 DIAGNOSIS — D509 Iron deficiency anemia, unspecified: Secondary | ICD-10-CM | POA: Diagnosis not present

## 2021-12-10 DIAGNOSIS — R82998 Other abnormal findings in urine: Secondary | ICD-10-CM | POA: Diagnosis not present

## 2021-12-10 DIAGNOSIS — N2581 Secondary hyperparathyroidism of renal origin: Secondary | ICD-10-CM | POA: Diagnosis not present

## 2021-12-10 DIAGNOSIS — Z4932 Encounter for adequacy testing for peritoneal dialysis: Secondary | ICD-10-CM | POA: Diagnosis not present

## 2021-12-10 DIAGNOSIS — D631 Anemia in chronic kidney disease: Secondary | ICD-10-CM | POA: Diagnosis not present

## 2021-12-11 DIAGNOSIS — Z79899 Other long term (current) drug therapy: Secondary | ICD-10-CM | POA: Diagnosis not present

## 2021-12-11 DIAGNOSIS — Z4932 Encounter for adequacy testing for peritoneal dialysis: Secondary | ICD-10-CM | POA: Diagnosis not present

## 2021-12-11 DIAGNOSIS — N186 End stage renal disease: Secondary | ICD-10-CM | POA: Diagnosis not present

## 2021-12-11 DIAGNOSIS — D631 Anemia in chronic kidney disease: Secondary | ICD-10-CM | POA: Diagnosis not present

## 2021-12-11 DIAGNOSIS — D509 Iron deficiency anemia, unspecified: Secondary | ICD-10-CM | POA: Diagnosis not present

## 2021-12-11 DIAGNOSIS — Z992 Dependence on renal dialysis: Secondary | ICD-10-CM | POA: Diagnosis not present

## 2021-12-11 DIAGNOSIS — R82998 Other abnormal findings in urine: Secondary | ICD-10-CM | POA: Diagnosis not present

## 2021-12-11 DIAGNOSIS — N2581 Secondary hyperparathyroidism of renal origin: Secondary | ICD-10-CM | POA: Diagnosis not present

## 2021-12-12 DIAGNOSIS — R82998 Other abnormal findings in urine: Secondary | ICD-10-CM | POA: Diagnosis not present

## 2021-12-12 DIAGNOSIS — N2581 Secondary hyperparathyroidism of renal origin: Secondary | ICD-10-CM | POA: Diagnosis not present

## 2021-12-12 DIAGNOSIS — D631 Anemia in chronic kidney disease: Secondary | ICD-10-CM | POA: Diagnosis not present

## 2021-12-12 DIAGNOSIS — Z992 Dependence on renal dialysis: Secondary | ICD-10-CM | POA: Diagnosis not present

## 2021-12-12 DIAGNOSIS — Z79899 Other long term (current) drug therapy: Secondary | ICD-10-CM | POA: Diagnosis not present

## 2021-12-12 DIAGNOSIS — D509 Iron deficiency anemia, unspecified: Secondary | ICD-10-CM | POA: Diagnosis not present

## 2021-12-12 DIAGNOSIS — N186 End stage renal disease: Secondary | ICD-10-CM | POA: Diagnosis not present

## 2021-12-12 DIAGNOSIS — Z4932 Encounter for adequacy testing for peritoneal dialysis: Secondary | ICD-10-CM | POA: Diagnosis not present

## 2021-12-13 ENCOUNTER — Other Ambulatory Visit: Payer: Self-pay | Admitting: Physician Assistant

## 2021-12-13 DIAGNOSIS — N186 End stage renal disease: Secondary | ICD-10-CM | POA: Diagnosis not present

## 2021-12-13 DIAGNOSIS — Z79899 Other long term (current) drug therapy: Secondary | ICD-10-CM | POA: Diagnosis not present

## 2021-12-13 DIAGNOSIS — Z4932 Encounter for adequacy testing for peritoneal dialysis: Secondary | ICD-10-CM | POA: Diagnosis not present

## 2021-12-13 DIAGNOSIS — N2581 Secondary hyperparathyroidism of renal origin: Secondary | ICD-10-CM | POA: Diagnosis not present

## 2021-12-13 DIAGNOSIS — D509 Iron deficiency anemia, unspecified: Secondary | ICD-10-CM | POA: Diagnosis not present

## 2021-12-13 DIAGNOSIS — D631 Anemia in chronic kidney disease: Secondary | ICD-10-CM | POA: Diagnosis not present

## 2021-12-13 DIAGNOSIS — R82998 Other abnormal findings in urine: Secondary | ICD-10-CM | POA: Diagnosis not present

## 2021-12-13 DIAGNOSIS — Z992 Dependence on renal dialysis: Secondary | ICD-10-CM | POA: Diagnosis not present

## 2021-12-14 DIAGNOSIS — Z992 Dependence on renal dialysis: Secondary | ICD-10-CM | POA: Diagnosis not present

## 2021-12-14 DIAGNOSIS — D631 Anemia in chronic kidney disease: Secondary | ICD-10-CM | POA: Diagnosis not present

## 2021-12-14 DIAGNOSIS — N186 End stage renal disease: Secondary | ICD-10-CM | POA: Diagnosis not present

## 2021-12-14 DIAGNOSIS — R82998 Other abnormal findings in urine: Secondary | ICD-10-CM | POA: Diagnosis not present

## 2021-12-14 DIAGNOSIS — Z79899 Other long term (current) drug therapy: Secondary | ICD-10-CM | POA: Diagnosis not present

## 2021-12-14 DIAGNOSIS — Z4932 Encounter for adequacy testing for peritoneal dialysis: Secondary | ICD-10-CM | POA: Diagnosis not present

## 2021-12-14 DIAGNOSIS — N2581 Secondary hyperparathyroidism of renal origin: Secondary | ICD-10-CM | POA: Diagnosis not present

## 2021-12-14 DIAGNOSIS — D509 Iron deficiency anemia, unspecified: Secondary | ICD-10-CM | POA: Diagnosis not present

## 2021-12-15 DIAGNOSIS — Z79899 Other long term (current) drug therapy: Secondary | ICD-10-CM | POA: Diagnosis not present

## 2021-12-15 DIAGNOSIS — D509 Iron deficiency anemia, unspecified: Secondary | ICD-10-CM | POA: Diagnosis not present

## 2021-12-15 DIAGNOSIS — N2581 Secondary hyperparathyroidism of renal origin: Secondary | ICD-10-CM | POA: Diagnosis not present

## 2021-12-15 DIAGNOSIS — Z4932 Encounter for adequacy testing for peritoneal dialysis: Secondary | ICD-10-CM | POA: Diagnosis not present

## 2021-12-15 DIAGNOSIS — Z992 Dependence on renal dialysis: Secondary | ICD-10-CM | POA: Diagnosis not present

## 2021-12-15 DIAGNOSIS — N186 End stage renal disease: Secondary | ICD-10-CM | POA: Diagnosis not present

## 2021-12-15 DIAGNOSIS — D631 Anemia in chronic kidney disease: Secondary | ICD-10-CM | POA: Diagnosis not present

## 2021-12-15 DIAGNOSIS — R82998 Other abnormal findings in urine: Secondary | ICD-10-CM | POA: Diagnosis not present

## 2021-12-16 DIAGNOSIS — Z79899 Other long term (current) drug therapy: Secondary | ICD-10-CM | POA: Diagnosis not present

## 2021-12-16 DIAGNOSIS — N186 End stage renal disease: Secondary | ICD-10-CM | POA: Diagnosis not present

## 2021-12-16 DIAGNOSIS — D509 Iron deficiency anemia, unspecified: Secondary | ICD-10-CM | POA: Diagnosis not present

## 2021-12-16 DIAGNOSIS — R82998 Other abnormal findings in urine: Secondary | ICD-10-CM | POA: Diagnosis not present

## 2021-12-16 DIAGNOSIS — N2581 Secondary hyperparathyroidism of renal origin: Secondary | ICD-10-CM | POA: Diagnosis not present

## 2021-12-16 DIAGNOSIS — Z4932 Encounter for adequacy testing for peritoneal dialysis: Secondary | ICD-10-CM | POA: Diagnosis not present

## 2021-12-16 DIAGNOSIS — D631 Anemia in chronic kidney disease: Secondary | ICD-10-CM | POA: Diagnosis not present

## 2021-12-16 DIAGNOSIS — Z992 Dependence on renal dialysis: Secondary | ICD-10-CM | POA: Diagnosis not present

## 2021-12-17 DIAGNOSIS — N2581 Secondary hyperparathyroidism of renal origin: Secondary | ICD-10-CM | POA: Diagnosis not present

## 2021-12-17 DIAGNOSIS — R82998 Other abnormal findings in urine: Secondary | ICD-10-CM | POA: Diagnosis not present

## 2021-12-17 DIAGNOSIS — Z992 Dependence on renal dialysis: Secondary | ICD-10-CM | POA: Diagnosis not present

## 2021-12-17 DIAGNOSIS — Z4932 Encounter for adequacy testing for peritoneal dialysis: Secondary | ICD-10-CM | POA: Diagnosis not present

## 2021-12-17 DIAGNOSIS — Z79899 Other long term (current) drug therapy: Secondary | ICD-10-CM | POA: Diagnosis not present

## 2021-12-17 DIAGNOSIS — N186 End stage renal disease: Secondary | ICD-10-CM | POA: Diagnosis not present

## 2021-12-17 DIAGNOSIS — D509 Iron deficiency anemia, unspecified: Secondary | ICD-10-CM | POA: Diagnosis not present

## 2021-12-17 DIAGNOSIS — D631 Anemia in chronic kidney disease: Secondary | ICD-10-CM | POA: Diagnosis not present

## 2021-12-18 DIAGNOSIS — R82998 Other abnormal findings in urine: Secondary | ICD-10-CM | POA: Diagnosis not present

## 2021-12-18 DIAGNOSIS — N2581 Secondary hyperparathyroidism of renal origin: Secondary | ICD-10-CM | POA: Diagnosis not present

## 2021-12-18 DIAGNOSIS — Z4932 Encounter for adequacy testing for peritoneal dialysis: Secondary | ICD-10-CM | POA: Diagnosis not present

## 2021-12-18 DIAGNOSIS — N186 End stage renal disease: Secondary | ICD-10-CM | POA: Diagnosis not present

## 2021-12-18 DIAGNOSIS — Z79899 Other long term (current) drug therapy: Secondary | ICD-10-CM | POA: Diagnosis not present

## 2021-12-18 DIAGNOSIS — D631 Anemia in chronic kidney disease: Secondary | ICD-10-CM | POA: Diagnosis not present

## 2021-12-18 DIAGNOSIS — D509 Iron deficiency anemia, unspecified: Secondary | ICD-10-CM | POA: Diagnosis not present

## 2021-12-18 DIAGNOSIS — Z992 Dependence on renal dialysis: Secondary | ICD-10-CM | POA: Diagnosis not present

## 2021-12-19 DIAGNOSIS — D631 Anemia in chronic kidney disease: Secondary | ICD-10-CM | POA: Diagnosis not present

## 2021-12-19 DIAGNOSIS — N186 End stage renal disease: Secondary | ICD-10-CM | POA: Diagnosis not present

## 2021-12-19 DIAGNOSIS — D509 Iron deficiency anemia, unspecified: Secondary | ICD-10-CM | POA: Diagnosis not present

## 2021-12-19 DIAGNOSIS — Z79899 Other long term (current) drug therapy: Secondary | ICD-10-CM | POA: Diagnosis not present

## 2021-12-19 DIAGNOSIS — R82998 Other abnormal findings in urine: Secondary | ICD-10-CM | POA: Diagnosis not present

## 2021-12-19 DIAGNOSIS — Z4932 Encounter for adequacy testing for peritoneal dialysis: Secondary | ICD-10-CM | POA: Diagnosis not present

## 2021-12-19 DIAGNOSIS — N2581 Secondary hyperparathyroidism of renal origin: Secondary | ICD-10-CM | POA: Diagnosis not present

## 2021-12-19 DIAGNOSIS — Z992 Dependence on renal dialysis: Secondary | ICD-10-CM | POA: Diagnosis not present

## 2021-12-20 ENCOUNTER — Ambulatory Visit (INDEPENDENT_AMBULATORY_CARE_PROVIDER_SITE_OTHER): Payer: Medicare Other | Admitting: Cardiovascular Disease

## 2021-12-20 ENCOUNTER — Encounter: Payer: Self-pay | Admitting: Cardiovascular Disease

## 2021-12-20 ENCOUNTER — Ambulatory Visit (HOSPITAL_COMMUNITY): Payer: Medicare Other | Attending: Cardiology

## 2021-12-20 ENCOUNTER — Other Ambulatory Visit: Payer: Self-pay

## 2021-12-20 VITALS — BP 120/90 | HR 60 | Ht 70.0 in | Wt 197.6 lb

## 2021-12-20 DIAGNOSIS — I340 Nonrheumatic mitral (valve) insufficiency: Secondary | ICD-10-CM | POA: Diagnosis not present

## 2021-12-20 DIAGNOSIS — I2510 Atherosclerotic heart disease of native coronary artery without angina pectoris: Secondary | ICD-10-CM

## 2021-12-20 DIAGNOSIS — I471 Supraventricular tachycardia: Secondary | ICD-10-CM

## 2021-12-20 DIAGNOSIS — N2581 Secondary hyperparathyroidism of renal origin: Secondary | ICD-10-CM | POA: Diagnosis not present

## 2021-12-20 DIAGNOSIS — Z79899 Other long term (current) drug therapy: Secondary | ICD-10-CM | POA: Diagnosis not present

## 2021-12-20 DIAGNOSIS — N186 End stage renal disease: Secondary | ICD-10-CM | POA: Diagnosis not present

## 2021-12-20 DIAGNOSIS — I10 Essential (primary) hypertension: Secondary | ICD-10-CM | POA: Diagnosis not present

## 2021-12-20 DIAGNOSIS — Z4932 Encounter for adequacy testing for peritoneal dialysis: Secondary | ICD-10-CM | POA: Diagnosis not present

## 2021-12-20 DIAGNOSIS — D631 Anemia in chronic kidney disease: Secondary | ICD-10-CM | POA: Diagnosis not present

## 2021-12-20 DIAGNOSIS — Z992 Dependence on renal dialysis: Secondary | ICD-10-CM | POA: Diagnosis not present

## 2021-12-20 DIAGNOSIS — D509 Iron deficiency anemia, unspecified: Secondary | ICD-10-CM | POA: Diagnosis not present

## 2021-12-20 DIAGNOSIS — R82998 Other abnormal findings in urine: Secondary | ICD-10-CM | POA: Diagnosis not present

## 2021-12-20 LAB — ECHOCARDIOGRAM COMPLETE
AR max vel: 3 cm2
AV Area VTI: 3.01 cm2
AV Area mean vel: 3.21 cm2
AV Mean grad: 4 mmHg
AV Peak grad: 6.5 mmHg
Ao pk vel: 1.27 m/s
Area-P 1/2: 2.48 cm2
MV M vel: 5.75 m/s
MV Peak grad: 132 mmHg
P 1/2 time: 504 msec
S' Lateral: 4.4 cm

## 2021-12-20 NOTE — Progress Notes (Signed)
Cardiology Office Note:    Date:  12/20/2021   ID:  Albert Eaton, DOB 11-22-1965, MRN [id number]  PCP:  Teena Dunk, NP   [facility] Providers Cardiologist:  [name], MD Electrophysiologist:  Vickie Epley, MD     Referring MD: Bo Merino I, NP   Chief Complaint  Patient presents with   Shortness of Breath    History of Present Illness:    Albert Eaton is a 56 y.o. male with a hx of: HFpEF) heart failure with preserved ejection fraction  Mitral regurgitation  Echocardiogram 5/22: EF 60-65, mod to severe MR Eval by Dr. [name] 2/22>>med management  Coronary artery disease  Non-obstructive by Cor CTA in 09/2020  ESRD on hemodialysis Renal Cell CA s/p L nephrectomy 01/2021 ([facility]) Hypertension  Hyperlipidemia  OSA  HIV+ Atrial Tachycardia Eval with EP (Dr. Lurline Hare) in 3/22 >> Conservative mgmt Amio Rx if symptomatic or more significant   The patient is here alone today.  Reports that he is doing fairly well since his recent hospitalization.  He transition from hemodialysis to peritoneal dialysis a few years ago.  He was recently hospitalized because his of uremic type symptoms.  He is felt to be failing peritoneal dialysis and plans to fully transition to hemodialysis in the next few weeks.  He is treated by Dr. Hollie Salk for outpatient nephrology.  He denies any chest pain, chest pressure, orthopnea, PND, or edema.  He does admit to shortness of breath with physical activity.  This occurs with any moderate physical activity.  Past Medical History:  Diagnosis Date   Anemia    Aortic atherosclerosis (HCC)    Arthritis    Asthma    Cancer ([place])    renal cyst   Chronic diastolic CHF (congestive heart failure) ([place]) 01/06/2017   Echo 7/16 [facility] in [place], [name]) Mild AI, mild LAE, mild concentric LVH, EF 55, normal wall motion, mild to moderate MR, mild PI, RVSP 55 // Echo 10/09/16 ([facility]):  Moderate LVH, grade 2 diastolic  dysfunction, mild MR, moderate LAE    CKD (chronic kidney disease)    Dialysis Mon Wed Fri   Esophagitis    GERD (gastroesophageal reflux disease)    Gout    no current problems   Heart murmur    never caused any problems   Hepatitis    Hep B   History of nuclear stress test    a. Nuc study 7/16: no scar or ischemia, EF 42 // b. Nuc study 12/17: EF 48, ?small apical ischemia, Low Risk   HIV (human immunodeficiency virus infection) ([place])    HLD (hyperlipidemia)    Hypertension    Hypertensive heart disease with CHF (congestive heart failure) ([place]) 10/09/2016   Hypothyroidism    Mitral regurgitation    OSA (obstructive sleep apnea)    Post-operative nausea and vomiting    1 time as a child, no problems as an adult   Sleep apnea    uses c-pap   Thyroid disease    Wears glasses    Wears partial dentures     Past Surgical History:  Procedure Laterality Date   AV FISTULA PLACEMENT Left 07/02/2018   Procedure: Creation of Left arm BRACHIOBASILIC ARTERIOVENOUS  FISTULA;  Surgeon: Marty Heck, MD;  Location: [place];  Service: Vascular;  Laterality: Left;   [place] Left 08/27/2018   Procedure: Left arm BRACHIOBASILIC VEIN TRANSPOSITION SECOND STAGE;  Surgeon: Marty Heck, MD;  Location: [place] [facility];  Service: Vascular;  Laterality: Left;   BUBBLE STUDY  09/03/2020   Procedure: BUBBLE STUDY;  Surgeon: Fay Records, MD;  Location: [place];  Service: Cardiovascular;;   CAPD INSERTION N/A 05/30/2021   Procedure: LAPAROSCOPIC INSERTION CONTINUOUS AMBULATORY PERITONEAL DIALYSIS  (CAPD) CATHETER;  Surgeon: [name], MD;  Location: [place];  Service: Vascular;  Laterality: N/A;   CHOLECYSTECTOMY     COLONOSCOPY W/ BIOPSIES AND POLYPECTOMY     GIVENS CAPSULE STUDY N/A 03/01/2018   Procedure: GIVENS CAPSULE STUDY;  Surgeon: Jerene Bears, MD;  Location: [place];  Service: Gastroenterology;  Laterality: N/A;   HERNIA REPAIR     As baby   MULTIPLE  TOOTH EXTRACTIONS     NEPHRECTOMY Left 02/18/2021   NOSE SURGERY     RENAL BIOPSY     RIGHT/LEFT HEART CATH AND CORONARY ANGIOGRAPHY N/A 09/04/2021   Procedure: RIGHT/LEFT HEART CATH AND CORONARY ANGIOGRAPHY;  Surgeon: Burnell Blanks, MD;  Location: [place] CV [facility];  Service: Cardiovascular;  Laterality: N/A;   TEE WITHOUT CARDIOVERSION N/A 09/03/2020   Procedure: TRANSESOPHAGEAL ECHOCARDIOGRAM (TEE);  Surgeon: Fay Records, MD;  Location[facility] ENDOSCOPY;  Service: Cardiovascular;  Laterality: N/A;   UPPER GI ENDOSCOPY  04/05/2021    Current Medications: Current Meds  Medication Sig   ACETAMINOPHEN EXTRA STRENGTH 500 MG tablet Take 500 mg by mouth every 6 (six) hours as needed (pain.).   albuterol (PROAIR HFA) 108 (90 Base) MCG/ACT inhaler INHALE 2 PUFFS INTO THE LUNGS EVERY 4 HOURS AS NEEDED FOR WHEEZING OR SHORTNESS OF BREATH (Patient taking differently: 2 puffs every 4 (four) hours as needed for wheezing or shortness of breath.)   allopurinol (ZYLOPRIM) 100 MG tablet TAKE 1 TABLET(100 MG) BY MOUTH TWICE DAILY FOR 60 DOSES (Patient taking differently: Take 100 mg by mouth 2 (two) times daily.)   amLODipine (NORVASC) 10 MG tablet Take 1 tablet (10 mg total) by mouth daily. (Patient taking differently: Take 10 mg by mouth every morning.)   apixaban (ELIQUIS) 5 MG TABS tablet Take 1 tablet (5 mg total) by mouth 2 (two) times daily.   aspirin EC 81 MG tablet Take 1 tablet (81 mg total) by mouth daily. Swallow whole. (Patient taking differently: Take 81 mg by mouth in the morning. Swallow whole.)   BIKTARVY 50-200-25 MG TABS tablet TAKE 1 TABLET BY MOUTH DAILY (Patient taking differently: 1 tablet every morning.)   calcitRIOL (ROCALTROL) 0.5 MCG capsule Take 0.5 mcg by mouth every Monday, Wednesday, and Friday.   chlorproMAZINE (THORAZINE) 10 MG tablet Take 1 tablet (10 mg total) by mouth 3 (three) times daily as needed for hiccoughs. (Patient taking differently: Take 10 mg by mouth 2  (two) times daily.)   cinacalcet (SENSIPAR) 30 MG tablet Take 30 mg by mouth every evening.   clobetasol (OLUX) 0.05 % topical foam Apply topically 2 (two) times daily.   docusate sodium (COLACE) 100 MG capsule Take 100 mg by mouth 2 (two) times daily.   famotidine (PEPCID) 20 MG tablet Take 1 tablet (20 mg total) by mouth 2 (two) times daily. Please keep upcoming appt. With Cardilologist in order to receive further refills. Thank you.   ferric citrate (AURYXIA) 1 GM 210 MG(Fe) tablet Take 210 mg by mouth daily with lunch.   fluticasone (FLOVENT HFA) 110 MCG/ACT inhaler INHALE 2 PUFFS INTO THE LUNGS TWICE DAILY (Patient taking differently: Inhale 2 puffs into the lungs every morning.)   gabapentin (NEURONTIN) 300  MG capsule TAKE 1 CAPSULE(300 MG) BY MOUTH THREE TIMES DAILY (Patient taking differently: Take 300 mg by mouth at bedtime.)   hyoscyamine (LEVSIN SL) 0.125 MG SL tablet Place 1-2 tablets (0.125-0.25 mg total) under the tongue every 8 (eight) hours as needed (lower abdominal pain, spasm).   ipratropium (ATROVENT) 0.06 % nasal spray USE 2 SPRAYS IN EACH NOSTRIL 2 TO 3 TIMES DAILY AS NEEDED (Patient taking differently: Place 2 sprays into both nostrils 3 (three) times daily as needed (allergies).)   lactulose (CHRONULAC) 10 GM/15ML solution Take 20 g by mouth daily as needed for moderate constipation.    levothyroxine (SYNTHROID) 25 MCG tablet TAKE 1 TABLET BY MOUTH DAILY BEFORE BREAKFAST (Patient taking differently: Take 25 mcg by mouth daily before breakfast.)   lubiprostone (AMITIZA) 24 MCG capsule TAKE 1 CAPSULE(24 MCG) BY MOUTH TWICE DAILY WITH A MEAL (Patient taking differently: 24 mcg 2 (two) times daily as needed for constipation.)   Methoxy PEG-Epoetin Beta (MIRCERA IJ) Inject 1 each into the skin every 30 (thirty) days. Administered at dialysis center once a month   metoprolol tartrate (LOPRESSOR) 25 MG tablet Take 0.5 tablets (12.5 mg total) by mouth 2 (two) times daily.    multivitamin (RENA-VIT) TABS tablet Take 1 tablet by mouth at bedtime.   Olopatadine HCl (PAZEO) 0.7 % SOLN Place 1 drop into both eyes daily. (Patient taking differently: Place 1 drop into both eyes daily as needed (itchy eyes).)   pantoprazole (PROTONIX) 40 MG tablet TAKE 1 TABLET(40 MG) BY MOUTH DAILY (Patient taking differently: 40 mg at bedtime.)   potassium chloride (KLOR-CON M) 10 MEQ tablet Take 3 tablets (30 mEq total) by mouth 2 (two) times daily for 3 days.   rOPINIRole (REQUIP) 1 MG tablet Take 1 tablet (1 mg total) by mouth at bedtime.   rosuvastatin (CRESTOR) 10 MG tablet TAKE ONE TABLET BY MOUTH DAILY   Spacer/Aero-Holding Chambers DEVI 1 Device by Does not apply route 2 (two) times a day.   Vitamin D, Ergocalciferol, (DRISDOL) 1.25 MG (50000 UNIT) CAPS capsule TAKE 1 CAPSULE BY MOUTH EVERY 7 DAYS (Patient taking differently: Take 50,000 Units by mouth every Tuesday. At midday)     Allergies:   Ace inhibitors and Lisinopril   Social History   Socioeconomic History   Marital status: Single    Spouse name: Not on file   Number of children: Not on file   Years of education: Not on file   Highest education level: Not on file  Occupational History   Not on file  Tobacco Use   Smoking status: Former    Packs/day: 1.00    Types: Cigarettes    Quit date: 2000    Years since quitting: 23.1   Smokeless tobacco: Never  Vaping Use   Vaping Use: Never used  Substance and Sexual Activity   Alcohol use: Yes    Comment: occasionally liquor   Drug use: No   Sexual activity: Not Currently    Partners: Male    Birth control/protection: Condom    Comment: declined  condoms 07/11/20  Other Topics Concern   Not on file  Social History Narrative   Not on file   Social Determinants of Health   Financial Resource Strain: Not on file  Food Insecurity: Not on file  Transportation Needs: Not on file  Physical Activity: Not on file  Stress: Not on file  Social Connections: Not on  file     Family History: The patient's family history  includes Heart attack (age of onset: 59) in his maternal grandmother; Heart failure in his mother; Hypertension in his brother, sister, and sister; Multiple sclerosis in his sister; Scoliosis in an other family member. There is no history of Allergic rhinitis, Angioedema, Asthma, Eczema, Immunodeficiency, Urticaria, Colon cancer, Pancreatic cancer, or Esophageal cancer.  ROS:   Please see the history of present illness.    All other systems reviewed and are negative.  EKGs/Labs/Other Studies Reviewed:    The following studies were reviewed today: Today's 2D echo images are personally reviewed.  The formal interpretation is pending.  We did discuss my findings of his echo today which include moderately severe mitral regurgitation which appears to be stable.  Cardiac Cath 09/04/21: Conclusion      Prox RCA lesion is 50% stenosed.   Mid LAD lesion is 50% stenosed.   Prox LAD lesion is 40% stenosed.   2nd Mrg lesion is 30% stenosed.   Mild to moderate non-obstructive CAD Normal right and left heart pressures   Continue medical management of CAD.   Coronary Findings  Diagnostic Dominance: Left Left Anterior Descending  Vessel is large.  Prox LAD lesion is 40% stenosed.  Mid LAD lesion is 50% stenosed.    Left Circumflex  Vessel is large.    Second Obtuse Marginal Branch  2nd Mrg lesion is 30% stenosed.    Right Coronary Artery  Vessel is moderate in size.  Prox RCA lesion is 50% stenosed.    Intervention   No interventions have been documented.   Coronary Diagrams  Diagnostic Dominance: Left   EKG:  EKG is not ordered today.    Recent Labs: 09/03/2021: TSH 1.870 10/10/2021: ALT 12 12/02/2021: B Natriuretic Peptide 43.4 12/03/2021: Hemoglobin 10.7; Magnesium 1.9; Platelets 237 12/04/2021: BUN 17; Creatinine, Ser 8.04; Potassium 3.0; Sodium 133  Recent Lipid Panel    Component Value Date/Time   CHOL 126  09/04/2021 0347   CHOL 126 05/01/2021 1055   TRIG 320 (H) 09/04/2021 0347   HDL 34 (L) 09/04/2021 0347   HDL 40 05/01/2021 1055   CHOLHDL 3.7 09/04/2021 0347   VLDL 64 (H) 09/04/2021 0347   LDLCALC 28 09/04/2021 0347   LDLCALC 83 06/27/2021 0424     Risk Assessment/Calculations:           Physical Exam:    VS:  BP 120/90    Pulse 60    Ht 5' 10"  (1.778 m)    Wt 197 lb 9.6 oz (89.6 kg)    SpO2 98%    BMI 28.35 kg/m     Wt Readings from Last 3 Encounters:  12/20/21 197 lb 9.6 oz (89.6 kg)  12/03/21 196 lb 3.4 oz (89 kg)  11/12/21 203 lb (92.1 kg)     GEN:  Well nourished, well developed in no acute distress HEENT: Normal NECK: No JVD; No carotid bruits LYMPHATICS: No lymphadenopathy CARDIAC: RRR, 2/6 holosystolic murmur is appreciated in the mid axillary space RESPIRATORY:  Clear to auscultation without rales, wheezing or rhonchi  ABDOMEN: Soft, non-tender, non-distended MUSCULOSKELETAL:  No edema; No deformity  SKIN: Warm and dry NEUROLOGIC:  Alert and oriented x 3 PSYCHIATRIC:  Normal affect   ASSESSMENT:    1. Nonrheumatic mitral valve regurgitation   2. ESRD (end stage renal disease) ([place])   3. Coronary artery disease involving native coronary artery of native heart without angina pectoris   4. Essential hypertension   5. SVT (supraventricular tachycardia) ([place])    PLAN:  In order of problems listed above:  The patient continues to exhibit New York Heart Association functional class II symptoms chronic diastolic heart failure in the setting of end-stage renal disease, nonischemic cardiomyopathy, and moderately severe mitral regurgitation.  It appears he is going to transition from peritoneal to hemodialysis over the next few weeks and I think it would be important to reassess cardiac function and degree of mitral regurgitation once he is fully euvolemic and treated with hemodialysis.  We will plan to see him back for cardiology follow-up in 6 months with a  repeat echocardiogram.  I did review today's study and await the formal report.  His overall degree of mitral regurgitation appears stable in the moderately severe range. Failing peritoneal dialysis, transitioning to hemodialysis.  Followed closely by nephrology. Reviewed most recent cardiac catheterization.  Nonobstructive CAD noted.  No anginal symptoms. Blood pressure is currently controlled on metoprolol and amlodipine Clinically stable, he has been seen by Dr. Quentin Ore with plans for conservative medical therapy.           Medication Adjustments/Labs and Tests Ordered: Current medicines are reviewed at length with the patient today.  Concerns regarding medicines are outlined above.  Orders Placed This Encounter  Procedures   ECHOCARDIOGRAM COMPLETE   No orders of the defined types were placed in this encounter.   Patient Instructions  Medication Instructions:  Your physician recommends that you continue on your current medications as directed. Please refer to the Current Medication list given to you today.  *If you need a refill on your cardiac medications before your next appointment, please call your pharmacy*   Lab Work: NONE If you have labs (blood work) drawn today and your tests are completely normal, you will receive your results only by: [place] (if you have MyChart) OR A paper copy in the mail If you have any lab test that is abnormal or we need to change your treatment, we will call you to review the results.   Testing/Procedures: ECHO (same day appt) Your physician has requested that you have an echocardiogram. Echocardiography is a painless test that uses sound waves to create images of your heart. It provides your doctor with information about the size and shape of your heart and how well your hearts chambers and valves are working. This procedure takes approximately one hour. There are no restrictions for this procedure.    Follow-Up: At [facility], you and your health needs are our priority.  As part of our continuing mission to provide you with exceptional heart care, we have created designated Provider Care Teams.  These Care Teams include your primary Cardiologist (physician) and Advanced Practice Providers (APPs -  Physician Assistants and Nurse Practitioners) who all work together to provide you with the care you need, when you need it.  Your next appointment:   6 month(s)  The format for your next appointment:   In Person  Provider:   [name], MD  Albert Eaton   Thank you for allowing [place] at [place] to serve you :)    Signed, [name], MD  12/20/2021 12:45 PM    [place]

## 2021-12-20 NOTE — Patient Instructions (Signed)
Medication Instructions:  Your physician recommends that you continue on your current medications as directed. Please refer to the Current Medication list given to you today.  *If you need a refill on your cardiac medications before your next appointment, please call your pharmacy*   Lab Work: NONE If you have labs (blood work) drawn today and your tests are completely normal, you will receive your results only by: [place] (if you have MyChart) OR A paper copy in the mail If you have any lab test that is abnormal or we need to change your treatment, we will call you to review the results.   Testing/Procedures: ECHO (same day appt) Your physician has requested that you have an echocardiogram. Echocardiography is a painless test that uses sound waves to create images of your heart. It provides your doctor with information about the size and shape of your heart and how well your hearts chambers and valves are working. This procedure takes approximately one hour. There are no restrictions for this procedure.    Follow-Up: At [facility], you and your health needs are our priority.  As part of our continuing mission to provide you with exceptional heart care, we have created designated Provider Care Teams.  These Care Teams include your primary Cardiologist (physician) and Advanced Practice Providers (APPs -  Physician Assistants and Nurse Practitioners) who all work together to provide you with the care you need, when you need it.  Your next appointment:   6 month(s)  The format for your next appointment:   In Person  Provider:   [name], MD  Robbie Louis   Thank you for allowing [place] [facility] to serve you :)

## 2021-12-21 DIAGNOSIS — D509 Iron deficiency anemia, unspecified: Secondary | ICD-10-CM | POA: Diagnosis not present

## 2021-12-21 DIAGNOSIS — R82998 Other abnormal findings in urine: Secondary | ICD-10-CM | POA: Diagnosis not present

## 2021-12-21 DIAGNOSIS — N186 End stage renal disease: Secondary | ICD-10-CM | POA: Diagnosis not present

## 2021-12-21 DIAGNOSIS — Z79899 Other long term (current) drug therapy: Secondary | ICD-10-CM | POA: Diagnosis not present

## 2021-12-21 DIAGNOSIS — N2581 Secondary hyperparathyroidism of renal origin: Secondary | ICD-10-CM | POA: Diagnosis not present

## 2021-12-21 DIAGNOSIS — Z992 Dependence on renal dialysis: Secondary | ICD-10-CM | POA: Diagnosis not present

## 2021-12-21 DIAGNOSIS — Z4932 Encounter for adequacy testing for peritoneal dialysis: Secondary | ICD-10-CM | POA: Diagnosis not present

## 2021-12-21 DIAGNOSIS — D631 Anemia in chronic kidney disease: Secondary | ICD-10-CM | POA: Diagnosis not present

## 2021-12-22 DIAGNOSIS — D631 Anemia in chronic kidney disease: Secondary | ICD-10-CM | POA: Diagnosis not present

## 2021-12-22 DIAGNOSIS — Z79899 Other long term (current) drug therapy: Secondary | ICD-10-CM | POA: Diagnosis not present

## 2021-12-22 DIAGNOSIS — Z992 Dependence on renal dialysis: Secondary | ICD-10-CM | POA: Diagnosis not present

## 2021-12-22 DIAGNOSIS — Z4932 Encounter for adequacy testing for peritoneal dialysis: Secondary | ICD-10-CM | POA: Diagnosis not present

## 2021-12-22 DIAGNOSIS — R82998 Other abnormal findings in urine: Secondary | ICD-10-CM | POA: Diagnosis not present

## 2021-12-22 DIAGNOSIS — D509 Iron deficiency anemia, unspecified: Secondary | ICD-10-CM | POA: Diagnosis not present

## 2021-12-22 DIAGNOSIS — N2581 Secondary hyperparathyroidism of renal origin: Secondary | ICD-10-CM | POA: Diagnosis not present

## 2021-12-22 DIAGNOSIS — N186 End stage renal disease: Secondary | ICD-10-CM | POA: Diagnosis not present

## 2021-12-23 DIAGNOSIS — R82998 Other abnormal findings in urine: Secondary | ICD-10-CM | POA: Diagnosis not present

## 2021-12-23 DIAGNOSIS — Z992 Dependence on renal dialysis: Secondary | ICD-10-CM | POA: Diagnosis not present

## 2021-12-23 DIAGNOSIS — D631 Anemia in chronic kidney disease: Secondary | ICD-10-CM | POA: Diagnosis not present

## 2021-12-23 DIAGNOSIS — N186 End stage renal disease: Secondary | ICD-10-CM | POA: Diagnosis not present

## 2021-12-23 DIAGNOSIS — Z79899 Other long term (current) drug therapy: Secondary | ICD-10-CM | POA: Diagnosis not present

## 2021-12-23 DIAGNOSIS — D509 Iron deficiency anemia, unspecified: Secondary | ICD-10-CM | POA: Diagnosis not present

## 2021-12-23 DIAGNOSIS — Z4932 Encounter for adequacy testing for peritoneal dialysis: Secondary | ICD-10-CM | POA: Diagnosis not present

## 2021-12-23 DIAGNOSIS — N2581 Secondary hyperparathyroidism of renal origin: Secondary | ICD-10-CM | POA: Diagnosis not present

## 2021-12-24 ENCOUNTER — Other Ambulatory Visit: Payer: Self-pay | Admitting: Internal Medicine

## 2021-12-24 DIAGNOSIS — R82998 Other abnormal findings in urine: Secondary | ICD-10-CM | POA: Diagnosis not present

## 2021-12-24 DIAGNOSIS — B20 Human immunodeficiency virus [HIV] disease: Secondary | ICD-10-CM

## 2021-12-24 DIAGNOSIS — D631 Anemia in chronic kidney disease: Secondary | ICD-10-CM | POA: Diagnosis not present

## 2021-12-24 DIAGNOSIS — N2581 Secondary hyperparathyroidism of renal origin: Secondary | ICD-10-CM | POA: Diagnosis not present

## 2021-12-24 DIAGNOSIS — I129 Hypertensive chronic kidney disease with stage 1 through stage 4 chronic kidney disease, or unspecified chronic kidney disease: Secondary | ICD-10-CM | POA: Diagnosis not present

## 2021-12-24 DIAGNOSIS — D509 Iron deficiency anemia, unspecified: Secondary | ICD-10-CM | POA: Diagnosis not present

## 2021-12-24 DIAGNOSIS — N186 End stage renal disease: Secondary | ICD-10-CM | POA: Diagnosis not present

## 2021-12-24 DIAGNOSIS — Z79899 Other long term (current) drug therapy: Secondary | ICD-10-CM | POA: Diagnosis not present

## 2021-12-24 DIAGNOSIS — Z992 Dependence on renal dialysis: Secondary | ICD-10-CM | POA: Diagnosis not present

## 2021-12-24 DIAGNOSIS — Z4932 Encounter for adequacy testing for peritoneal dialysis: Secondary | ICD-10-CM | POA: Diagnosis not present

## 2021-12-25 ENCOUNTER — Other Ambulatory Visit: Payer: Self-pay

## 2021-12-25 DIAGNOSIS — N186 End stage renal disease: Secondary | ICD-10-CM | POA: Diagnosis not present

## 2021-12-25 DIAGNOSIS — D509 Iron deficiency anemia, unspecified: Secondary | ICD-10-CM | POA: Diagnosis not present

## 2021-12-25 DIAGNOSIS — Z992 Dependence on renal dialysis: Secondary | ICD-10-CM | POA: Diagnosis not present

## 2021-12-25 DIAGNOSIS — B20 Human immunodeficiency virus [HIV] disease: Secondary | ICD-10-CM

## 2021-12-25 DIAGNOSIS — N2581 Secondary hyperparathyroidism of renal origin: Secondary | ICD-10-CM | POA: Diagnosis not present

## 2021-12-25 DIAGNOSIS — E876 Hypokalemia: Secondary | ICD-10-CM | POA: Diagnosis not present

## 2021-12-25 DIAGNOSIS — D631 Anemia in chronic kidney disease: Secondary | ICD-10-CM | POA: Diagnosis not present

## 2021-12-26 ENCOUNTER — Other Ambulatory Visit: Payer: Medicare Other

## 2021-12-27 DIAGNOSIS — N2581 Secondary hyperparathyroidism of renal origin: Secondary | ICD-10-CM | POA: Diagnosis not present

## 2021-12-27 DIAGNOSIS — N186 End stage renal disease: Secondary | ICD-10-CM | POA: Diagnosis not present

## 2021-12-27 DIAGNOSIS — D631 Anemia in chronic kidney disease: Secondary | ICD-10-CM | POA: Diagnosis not present

## 2021-12-27 DIAGNOSIS — Z992 Dependence on renal dialysis: Secondary | ICD-10-CM | POA: Diagnosis not present

## 2021-12-27 DIAGNOSIS — D509 Iron deficiency anemia, unspecified: Secondary | ICD-10-CM | POA: Diagnosis not present

## 2021-12-30 DIAGNOSIS — D509 Iron deficiency anemia, unspecified: Secondary | ICD-10-CM | POA: Diagnosis not present

## 2021-12-30 DIAGNOSIS — N186 End stage renal disease: Secondary | ICD-10-CM | POA: Diagnosis not present

## 2021-12-30 DIAGNOSIS — D631 Anemia in chronic kidney disease: Secondary | ICD-10-CM | POA: Diagnosis not present

## 2021-12-30 DIAGNOSIS — E876 Hypokalemia: Secondary | ICD-10-CM | POA: Diagnosis not present

## 2021-12-30 DIAGNOSIS — N2581 Secondary hyperparathyroidism of renal origin: Secondary | ICD-10-CM | POA: Diagnosis not present

## 2021-12-30 DIAGNOSIS — Z992 Dependence on renal dialysis: Secondary | ICD-10-CM | POA: Diagnosis not present

## 2021-12-30 MED ORDER — BIKTARVY 50-200-25 MG PO TABS: 1.0000 | ORAL_TABLET | Freq: Every day | ORAL | 0 refills | Status: DC

## 2021-12-31 ENCOUNTER — Other Ambulatory Visit: Payer: Self-pay | Admitting: Internal Medicine

## 2022-01-01 DIAGNOSIS — D509 Iron deficiency anemia, unspecified: Secondary | ICD-10-CM | POA: Diagnosis not present

## 2022-01-01 DIAGNOSIS — N2581 Secondary hyperparathyroidism of renal origin: Secondary | ICD-10-CM | POA: Diagnosis not present

## 2022-01-01 DIAGNOSIS — Z992 Dependence on renal dialysis: Secondary | ICD-10-CM | POA: Diagnosis not present

## 2022-01-01 DIAGNOSIS — D631 Anemia in chronic kidney disease: Secondary | ICD-10-CM | POA: Diagnosis not present

## 2022-01-01 DIAGNOSIS — E876 Hypokalemia: Secondary | ICD-10-CM | POA: Diagnosis not present

## 2022-01-01 DIAGNOSIS — N186 End stage renal disease: Secondary | ICD-10-CM | POA: Diagnosis not present

## 2022-01-03 DIAGNOSIS — E876 Hypokalemia: Secondary | ICD-10-CM | POA: Diagnosis not present

## 2022-01-03 DIAGNOSIS — N186 End stage renal disease: Secondary | ICD-10-CM | POA: Diagnosis not present

## 2022-01-03 DIAGNOSIS — D631 Anemia in chronic kidney disease: Secondary | ICD-10-CM | POA: Diagnosis not present

## 2022-01-03 DIAGNOSIS — N2581 Secondary hyperparathyroidism of renal origin: Secondary | ICD-10-CM | POA: Diagnosis not present

## 2022-01-03 DIAGNOSIS — Z992 Dependence on renal dialysis: Secondary | ICD-10-CM | POA: Diagnosis not present

## 2022-01-03 DIAGNOSIS — D509 Iron deficiency anemia, unspecified: Secondary | ICD-10-CM | POA: Diagnosis not present

## 2022-01-04 ENCOUNTER — Other Ambulatory Visit: Payer: Self-pay | Admitting: Infectious Diseases

## 2022-01-04 DIAGNOSIS — B20 Human immunodeficiency virus [HIV] disease: Secondary | ICD-10-CM

## 2022-01-06 DIAGNOSIS — D631 Anemia in chronic kidney disease: Secondary | ICD-10-CM | POA: Diagnosis not present

## 2022-01-06 DIAGNOSIS — N186 End stage renal disease: Secondary | ICD-10-CM | POA: Diagnosis not present

## 2022-01-06 DIAGNOSIS — Z992 Dependence on renal dialysis: Secondary | ICD-10-CM | POA: Diagnosis not present

## 2022-01-06 DIAGNOSIS — E876 Hypokalemia: Secondary | ICD-10-CM | POA: Diagnosis not present

## 2022-01-06 DIAGNOSIS — D509 Iron deficiency anemia, unspecified: Secondary | ICD-10-CM | POA: Diagnosis not present

## 2022-01-06 DIAGNOSIS — N2581 Secondary hyperparathyroidism of renal origin: Secondary | ICD-10-CM | POA: Diagnosis not present

## 2022-01-07 DIAGNOSIS — M5386 Other specified dorsopathies, lumbar region: Secondary | ICD-10-CM | POA: Diagnosis not present

## 2022-01-07 DIAGNOSIS — M9905 Segmental and somatic dysfunction of pelvic region: Secondary | ICD-10-CM | POA: Diagnosis not present

## 2022-01-07 DIAGNOSIS — M9903 Segmental and somatic dysfunction of lumbar region: Secondary | ICD-10-CM | POA: Diagnosis not present

## 2022-01-07 DIAGNOSIS — M9904 Segmental and somatic dysfunction of sacral region: Secondary | ICD-10-CM | POA: Diagnosis not present

## 2022-01-08 ENCOUNTER — Other Ambulatory Visit: Payer: Self-pay | Admitting: Infectious Diseases

## 2022-01-08 DIAGNOSIS — Z992 Dependence on renal dialysis: Secondary | ICD-10-CM | POA: Diagnosis not present

## 2022-01-08 DIAGNOSIS — D631 Anemia in chronic kidney disease: Secondary | ICD-10-CM | POA: Diagnosis not present

## 2022-01-08 DIAGNOSIS — B20 Human immunodeficiency virus [HIV] disease: Secondary | ICD-10-CM

## 2022-01-08 DIAGNOSIS — E876 Hypokalemia: Secondary | ICD-10-CM | POA: Diagnosis not present

## 2022-01-08 DIAGNOSIS — D509 Iron deficiency anemia, unspecified: Secondary | ICD-10-CM | POA: Diagnosis not present

## 2022-01-08 DIAGNOSIS — N186 End stage renal disease: Secondary | ICD-10-CM | POA: Diagnosis not present

## 2022-01-08 DIAGNOSIS — N2581 Secondary hyperparathyroidism of renal origin: Secondary | ICD-10-CM | POA: Diagnosis not present

## 2022-01-08 NOTE — Telephone Encounter (Signed)
Appt 01/10/22 ?

## 2022-01-09 DIAGNOSIS — M9905 Segmental and somatic dysfunction of pelvic region: Secondary | ICD-10-CM | POA: Diagnosis not present

## 2022-01-09 DIAGNOSIS — M5386 Other specified dorsopathies, lumbar region: Secondary | ICD-10-CM | POA: Diagnosis not present

## 2022-01-09 DIAGNOSIS — M9904 Segmental and somatic dysfunction of sacral region: Secondary | ICD-10-CM | POA: Diagnosis not present

## 2022-01-09 DIAGNOSIS — M9903 Segmental and somatic dysfunction of lumbar region: Secondary | ICD-10-CM | POA: Diagnosis not present

## 2022-01-10 ENCOUNTER — Encounter: Payer: Medicare Other | Admitting: Infectious Diseases

## 2022-01-10 DIAGNOSIS — N186 End stage renal disease: Secondary | ICD-10-CM | POA: Diagnosis not present

## 2022-01-10 DIAGNOSIS — D509 Iron deficiency anemia, unspecified: Secondary | ICD-10-CM | POA: Diagnosis not present

## 2022-01-10 DIAGNOSIS — N2581 Secondary hyperparathyroidism of renal origin: Secondary | ICD-10-CM | POA: Diagnosis not present

## 2022-01-10 DIAGNOSIS — E876 Hypokalemia: Secondary | ICD-10-CM | POA: Diagnosis not present

## 2022-01-10 DIAGNOSIS — D631 Anemia in chronic kidney disease: Secondary | ICD-10-CM | POA: Diagnosis not present

## 2022-01-10 DIAGNOSIS — Z992 Dependence on renal dialysis: Secondary | ICD-10-CM | POA: Diagnosis not present

## 2022-01-13 ENCOUNTER — Other Ambulatory Visit: Payer: Self-pay

## 2022-01-13 ENCOUNTER — Other Ambulatory Visit: Payer: Medicare Other

## 2022-01-13 DIAGNOSIS — Z992 Dependence on renal dialysis: Secondary | ICD-10-CM | POA: Diagnosis not present

## 2022-01-13 DIAGNOSIS — B20 Human immunodeficiency virus [HIV] disease: Secondary | ICD-10-CM

## 2022-01-13 DIAGNOSIS — D631 Anemia in chronic kidney disease: Secondary | ICD-10-CM | POA: Diagnosis not present

## 2022-01-13 DIAGNOSIS — E876 Hypokalemia: Secondary | ICD-10-CM | POA: Diagnosis not present

## 2022-01-13 DIAGNOSIS — N2581 Secondary hyperparathyroidism of renal origin: Secondary | ICD-10-CM | POA: Diagnosis not present

## 2022-01-13 DIAGNOSIS — D509 Iron deficiency anemia, unspecified: Secondary | ICD-10-CM | POA: Diagnosis not present

## 2022-01-13 DIAGNOSIS — N186 End stage renal disease: Secondary | ICD-10-CM | POA: Diagnosis not present

## 2022-01-13 NOTE — Telephone Encounter (Signed)
Patient missed 3/17 appt, sent mychart message requesting he call office to reschedule.  ? ?Beryle Flock, RN ? ?

## 2022-01-14 ENCOUNTER — Other Ambulatory Visit: Payer: Self-pay | Admitting: Internal Medicine

## 2022-01-14 DIAGNOSIS — M9904 Segmental and somatic dysfunction of sacral region: Secondary | ICD-10-CM | POA: Diagnosis not present

## 2022-01-14 DIAGNOSIS — M5386 Other specified dorsopathies, lumbar region: Secondary | ICD-10-CM | POA: Diagnosis not present

## 2022-01-14 DIAGNOSIS — M9903 Segmental and somatic dysfunction of lumbar region: Secondary | ICD-10-CM | POA: Diagnosis not present

## 2022-01-14 DIAGNOSIS — M9905 Segmental and somatic dysfunction of pelvic region: Secondary | ICD-10-CM | POA: Diagnosis not present

## 2022-01-15 ENCOUNTER — Other Ambulatory Visit: Payer: Self-pay | Admitting: Pulmonary Disease

## 2022-01-15 DIAGNOSIS — Z992 Dependence on renal dialysis: Secondary | ICD-10-CM | POA: Diagnosis not present

## 2022-01-15 DIAGNOSIS — D631 Anemia in chronic kidney disease: Secondary | ICD-10-CM | POA: Diagnosis not present

## 2022-01-15 DIAGNOSIS — E876 Hypokalemia: Secondary | ICD-10-CM | POA: Diagnosis not present

## 2022-01-15 DIAGNOSIS — D509 Iron deficiency anemia, unspecified: Secondary | ICD-10-CM | POA: Diagnosis not present

## 2022-01-15 DIAGNOSIS — N186 End stage renal disease: Secondary | ICD-10-CM | POA: Diagnosis not present

## 2022-01-15 DIAGNOSIS — N2581 Secondary hyperparathyroidism of renal origin: Secondary | ICD-10-CM | POA: Diagnosis not present

## 2022-01-15 LAB — CBC WITH DIFFERENTIAL/PLATELET
Absolute Monocytes: 375 cells/uL (ref 200–950)
Basophils Absolute: 21 cells/uL (ref 0–200)
Basophils Relative: 0.6 %
Eosinophils Absolute: 203 cells/uL (ref 15–500)
Eosinophils Relative: 5.8 %
HCT: 28.7 % — ABNORMAL LOW (ref 38.5–50.0)
Hemoglobin: 9.8 g/dL — ABNORMAL LOW (ref 13.2–17.1)
Lymphs Abs: 1138 cells/uL (ref 850–3900)
MCH: 32.3 pg (ref 27.0–33.0)
MCHC: 34.1 g/dL (ref 32.0–36.0)
MCV: 94.7 fL (ref 80.0–100.0)
MPV: 10.6 fL (ref 7.5–12.5)
Monocytes Relative: 10.7 %
Neutro Abs: 1764 cells/uL (ref 1500–7800)
Neutrophils Relative %: 50.4 %
Platelets: 245 10*3/uL (ref 140–400)
RBC: 3.03 10*6/uL — ABNORMAL LOW (ref 4.20–5.80)
RDW: 13.7 % (ref 11.0–15.0)
Total Lymphocyte: 32.5 %
WBC: 3.5 10*3/uL — ABNORMAL LOW (ref 3.8–10.8)

## 2022-01-15 LAB — T-HELPER CELLS (CD4) COUNT (NOT AT ARMC)
Absolute CD4: 486 cells/uL — ABNORMAL LOW (ref 490–1740)
CD4 T Helper %: 38 % (ref 30–61)
Total lymphocyte count: 1268 cells/uL (ref 850–3900)

## 2022-01-15 LAB — COMPLETE METABOLIC PANEL WITH GFR
AG Ratio: 1.7 (calc) (ref 1.0–2.5)
ALT: 11 U/L (ref 9–46)
AST: 14 U/L (ref 10–35)
Albumin: 4.7 g/dL (ref 3.6–5.1)
Alkaline phosphatase (APISO): 101 U/L (ref 35–144)
BUN/Creatinine Ratio: 3 (calc) — ABNORMAL LOW (ref 6–22)
BUN: 20 mg/dL (ref 7–25)
CO2: 28 mmol/L (ref 20–32)
Calcium: 9.2 mg/dL (ref 8.6–10.3)
Chloride: 97 mmol/L — ABNORMAL LOW (ref 98–110)
Creat: 6.58 mg/dL — ABNORMAL HIGH (ref 0.70–1.30)
Globulin: 2.8 g/dL (calc) (ref 1.9–3.7)
Glucose, Bld: 178 mg/dL — ABNORMAL HIGH (ref 65–99)
Potassium: 3.5 mmol/L (ref 3.5–5.3)
Sodium: 138 mmol/L (ref 135–146)
Total Bilirubin: 0.4 mg/dL (ref 0.2–1.2)
Total Protein: 7.5 g/dL (ref 6.1–8.1)
eGFR: 9 mL/min/{1.73_m2} — ABNORMAL LOW (ref >=60)

## 2022-01-15 LAB — HIV-1 RNA QUANT-NO REFLEX-BLD
HIV 1 RNA Quant: 11900 Copies/mL — ABNORMAL HIGH
HIV-1 RNA Quant, Log: 4.08 Log cps/mL — ABNORMAL HIGH

## 2022-01-15 NOTE — Telephone Encounter (Signed)
Please advise on refill. Last OV was 11/12/21. ?

## 2022-01-15 NOTE — Telephone Encounter (Signed)
requip refilled ? ?

## 2022-01-16 DIAGNOSIS — M9905 Segmental and somatic dysfunction of pelvic region: Secondary | ICD-10-CM | POA: Diagnosis not present

## 2022-01-16 DIAGNOSIS — M9903 Segmental and somatic dysfunction of lumbar region: Secondary | ICD-10-CM | POA: Diagnosis not present

## 2022-01-16 DIAGNOSIS — M9904 Segmental and somatic dysfunction of sacral region: Secondary | ICD-10-CM | POA: Diagnosis not present

## 2022-01-16 DIAGNOSIS — M5386 Other specified dorsopathies, lumbar region: Secondary | ICD-10-CM | POA: Diagnosis not present

## 2022-01-17 DIAGNOSIS — D631 Anemia in chronic kidney disease: Secondary | ICD-10-CM | POA: Diagnosis not present

## 2022-01-17 DIAGNOSIS — N186 End stage renal disease: Secondary | ICD-10-CM | POA: Diagnosis not present

## 2022-01-17 DIAGNOSIS — N2581 Secondary hyperparathyroidism of renal origin: Secondary | ICD-10-CM | POA: Diagnosis not present

## 2022-01-17 DIAGNOSIS — E876 Hypokalemia: Secondary | ICD-10-CM | POA: Diagnosis not present

## 2022-01-17 DIAGNOSIS — Z992 Dependence on renal dialysis: Secondary | ICD-10-CM | POA: Diagnosis not present

## 2022-01-17 DIAGNOSIS — D509 Iron deficiency anemia, unspecified: Secondary | ICD-10-CM | POA: Diagnosis not present

## 2022-01-20 DIAGNOSIS — D509 Iron deficiency anemia, unspecified: Secondary | ICD-10-CM | POA: Diagnosis not present

## 2022-01-20 DIAGNOSIS — Z992 Dependence on renal dialysis: Secondary | ICD-10-CM | POA: Diagnosis not present

## 2022-01-20 DIAGNOSIS — D631 Anemia in chronic kidney disease: Secondary | ICD-10-CM | POA: Diagnosis not present

## 2022-01-20 DIAGNOSIS — N186 End stage renal disease: Secondary | ICD-10-CM | POA: Diagnosis not present

## 2022-01-20 DIAGNOSIS — N2581 Secondary hyperparathyroidism of renal origin: Secondary | ICD-10-CM | POA: Diagnosis not present

## 2022-01-20 DIAGNOSIS — E876 Hypokalemia: Secondary | ICD-10-CM | POA: Diagnosis not present

## 2022-01-20 NOTE — Progress Notes (Signed)
[facility] [place] ?PROGRESS NOTE ? ?ASSESSMENT / PLAN: ?Albert Eaton is a 56 y.o. male status post laparoscopic peritoneal dialysis catheter placement 05/30/21. Unfortunately, while the catheter is working, periotoneal dialysis is not working well enough for him. He would like the catheter removed. We will plan to do this on a non-dialysis day in the near future.  ? ?SUBJECTIVE: ?Peritoneal dialysis catheter working, but PD not sufficiently clearing toxins / urea.  ? ?OBJECTIVE: ?BP 133/89 (BP Location: Right Arm, Patient Position: Sitting, Cuff Size: Large)   Pulse 81   Temp 98.6 ?F (37 ?C)   Resp 20   Ht 5' 9.25" (1.759 m)   Wt 207 lb 3.2 oz (94 kg)   SpO2 99%   BMI 30.38 kg/m?  ? ?Constitutional: well appearing. no acute distress. ?Abdomen: soft, not distended. ? ? ?  Latest Ref Rng & Units 01/13/2022  ?  2:10 PM 12/03/2021  ?  3:23 AM 12/02/2021  ?  6:45 AM  ?CBC  ?WBC 3.8 - 10.8 Thousand/uL 3.5   6.9   9.2    ?Hemoglobin 13.2 - 17.1 g/dL 9.8   10.7   13.6    ?Hematocrit 38.5 - 50.0 % 28.7   29.8   37.2    ?Platelets 140 - 400 Thousand/uL 245   237   359    ?  ? ? ?  Latest Ref Rng & Units 01/13/2022  ?  2:10 PM 12/04/2021  ?  8:21 AM 12/03/2021  ?  3:23 AM  ?CMP  ?Glucose 65 - 99 mg/dL 178   113   108    ?BUN 7 - 25 mg/dL 20   17   25     ?Creatinine 0.70 - 1.30 mg/dL 6.58   8.04   11.07    ?Sodium 135 - 146 mmol/L 138   133   135    ?Potassium 3.5 - 5.3 mmol/L 3.5   3.0   3.1    ?Chloride 98 - 110 mmol/L 97   95   93    ?CO2 20 - 32 mmol/L 28   27   27     ?Calcium 8.6 - 10.3 mg/dL 9.2   8.2   9.4    ?Total Protein 6.1 - 8.1 g/dL 7.5      ?Total Bilirubin 0.2 - 1.2 mg/dL 0.4      ?AST 10 - 35 U/L 14      ?ALT 9 - 46 U/L 11      ? ? ?Estimated Creatinine Clearance: 14.4 mL/min (A) (by C-G formula based on SCr of 6.58 mg/dL (H)). ? ?[name]. Stanford Breed, MD ?[facility] [place] ?Office Phone Number: [phone] ?01/21/2022 1:28 PM ? ? ? ?

## 2022-01-20 NOTE — H&P (View-Only) (Signed)
[facility] [place] ?PROGRESS NOTE ? ?ASSESSMENT / PLAN: ?Albert Eaton is a 56 y.o. male status post laparoscopic peritoneal dialysis catheter placement 05/30/21. Unfortunately, while the catheter is working, periotoneal dialysis is not working well enough for him. He would like the catheter removed. We will plan to do this on a non-dialysis day in the near future.  ? ?SUBJECTIVE: ?Peritoneal dialysis catheter working, but PD not sufficiently clearing toxins / urea.  ? ?OBJECTIVE: ?BP 133/89 (BP Location: Right Arm, Patient Position: Sitting, Cuff Size: Large)   Pulse 81   Temp 98.6 ?F (37 ?C)   Resp 20   Ht 5' 9.25" (1.759 m)   Wt 207 lb 3.2 oz (94 kg)   SpO2 99%   BMI 30.38 kg/m?  ? ?Constitutional: well appearing. no acute distress. ?Abdomen: soft, not distended. ? ? ?  Latest Ref Rng & Units 01/13/2022  ?  2:10 PM 12/03/2021  ?  3:23 AM 12/02/2021  ?  6:45 AM  ?CBC  ?WBC 3.8 - 10.8 Thousand/uL 3.5   6.9   9.2    ?Hemoglobin 13.2 - 17.1 g/dL 9.8   10.7   13.6    ?Hematocrit 38.5 - 50.0 % 28.7   29.8   37.2    ?Platelets 140 - 400 Thousand/uL 245   237   359    ?  ? ? ?  Latest Ref Rng & Units 01/13/2022  ?  2:10 PM 12/04/2021  ?  8:21 AM 12/03/2021  ?  3:23 AM  ?CMP  ?Glucose 65 - 99 mg/dL 178   113   108    ?BUN 7 - 25 mg/dL 20   17   25     ?Creatinine 0.70 - 1.30 mg/dL 6.58   8.04   11.07    ?Sodium 135 - 146 mmol/L 138   133   135    ?Potassium 3.5 - 5.3 mmol/L 3.5   3.0   3.1    ?Chloride 98 - 110 mmol/L 97   95   93    ?CO2 20 - 32 mmol/L 28   27   27     ?Calcium 8.6 - 10.3 mg/dL 9.2   8.2   9.4    ?Total Protein 6.1 - 8.1 g/dL 7.5      ?Total Bilirubin 0.2 - 1.2 mg/dL 0.4      ?AST 10 - 35 U/L 14      ?ALT 9 - 46 U/L 11      ? ? ?Estimated Creatinine Clearance: 14.4 mL/min (A) (by C-G formula based on SCr of 6.58 mg/dL (H)). ? ?[name]. Stanford Breed, MD ?[facility] [place] ?Office Phone Number: [phone] ?01/21/2022 1:28 PM ? ? ? ?

## 2022-01-21 ENCOUNTER — Ambulatory Visit (INDEPENDENT_AMBULATORY_CARE_PROVIDER_SITE_OTHER): Payer: Medicare Other | Admitting: Vascular Surgery

## 2022-01-21 ENCOUNTER — Encounter: Payer: Self-pay | Admitting: Nurse Practitioner

## 2022-01-21 ENCOUNTER — Ambulatory Visit (INDEPENDENT_AMBULATORY_CARE_PROVIDER_SITE_OTHER): Payer: Medicare Other | Admitting: Nurse Practitioner

## 2022-01-21 ENCOUNTER — Other Ambulatory Visit: Payer: Self-pay

## 2022-01-21 ENCOUNTER — Encounter: Payer: Self-pay | Admitting: Vascular Surgery

## 2022-01-21 VITALS — BP 124/78 | HR 89 | Temp 97.8°F | Resp 14 | Ht 69.25 in | Wt 207.4 lb

## 2022-01-21 VITALS — BP 133/89 | HR 81 | Temp 98.6°F | Resp 20 | Ht 69.25 in | Wt 207.2 lb

## 2022-01-21 DIAGNOSIS — Z992 Dependence on renal dialysis: Secondary | ICD-10-CM

## 2022-01-21 DIAGNOSIS — J4520 Mild intermittent asthma, uncomplicated: Secondary | ICD-10-CM

## 2022-01-21 DIAGNOSIS — Z7689 Persons encountering health services in other specified circumstances: Secondary | ICD-10-CM

## 2022-01-21 DIAGNOSIS — N186 End stage renal disease: Secondary | ICD-10-CM | POA: Diagnosis not present

## 2022-01-21 DIAGNOSIS — G4733 Obstructive sleep apnea (adult) (pediatric): Secondary | ICD-10-CM

## 2022-01-21 DIAGNOSIS — E039 Hypothyroidism, unspecified: Secondary | ICD-10-CM | POA: Diagnosis not present

## 2022-01-21 DIAGNOSIS — I5032 Chronic diastolic (congestive) heart failure: Secondary | ICD-10-CM | POA: Diagnosis not present

## 2022-01-21 DIAGNOSIS — I10 Essential (primary) hypertension: Secondary | ICD-10-CM | POA: Diagnosis not present

## 2022-01-21 DIAGNOSIS — I4891 Unspecified atrial fibrillation: Secondary | ICD-10-CM

## 2022-01-21 DIAGNOSIS — I770 Arteriovenous fistula, acquired: Secondary | ICD-10-CM

## 2022-01-21 DIAGNOSIS — Z125 Encounter for screening for malignant neoplasm of prostate: Secondary | ICD-10-CM

## 2022-01-21 DIAGNOSIS — B20 Human immunodeficiency virus [HIV] disease: Secondary | ICD-10-CM

## 2022-01-21 LAB — TSH: TSH: 3.49 u[IU]/mL (ref 0.35–5.50)

## 2022-01-21 LAB — CBC
HCT: 28.6 % — ABNORMAL LOW (ref 39.0–52.0)
Hemoglobin: 9.7 g/dL — ABNORMAL LOW (ref 13.0–17.0)
MCHC: 34 g/dL (ref 30.0–36.0)
MCV: 96.4 fl (ref 78.0–100.0)
Platelets: 204 10*3/uL (ref 150.0–400.0)
RBC: 2.97 Mil/uL — ABNORMAL LOW (ref 4.22–5.81)
RDW: 15.2 % (ref 11.5–15.5)
WBC: 6 10*3/uL (ref 4.0–10.5)

## 2022-01-21 LAB — PSA, MEDICARE: PSA: 0.79 ng/ml (ref 0.10–4.00)

## 2022-01-21 NOTE — Assessment & Plan Note (Signed)
Currently maintained on levothyroxine 25 mcg daily.  Per patient report he is taking medication as prescribed and taking it correctly.  Pending labs today continue medication as prescribed ?

## 2022-01-21 NOTE — Assessment & Plan Note (Signed)
Currently followed by Dr. [name] continue following up as recommended and taking medications as prescribed ?

## 2022-01-21 NOTE — Assessment & Plan Note (Signed)
Patient currently maintained on amlodipine 10 mg and metoprolol 12 point milligrams twice daily.  Patient is followed by Dr. [name] from cardiology.  He taking medication as prescribed and follow-up as recommended ?

## 2022-01-21 NOTE — Progress Notes (Signed)
? ?New Patient Office Visit ? ?Subjective:  ?Patient ID: Albert Eaton, male    DOB: October 16, 1966  Age: 56 y.o. MRN: [id number] ? ?CC:  ?Chief Complaint  ?Patient presents with  ? Establish Care  ?  Previous PCP Bo Merino with [facility].  ? ? ?HPI ?[name] presents for Transfer of care ? ?HTN: Amlodpine, metoprolol. Checks bp as needed when he feels off. States tolerates medicatoin well ? ?CHF/ NSTEMI/ Afib: Currently on eliquis and was dx with PAF during recent hospitalization back in Feb 2023. No palpitiatoins noted per patient report ? ?OSA: CPAP. States that he has been having trouble with a mask being on his face. States he has been having trouble for approx 1 year. Has discussed with pulmonogist. ?. ?Asthma:currently on flovent and doing well. Will use albuterol inahler on occasion. States more use when pollen is bad he will use it daily ?Hypothyroidism:  doing well with that ?ESRD: MWF followed by [facility]. Was on PD and was switched as it was not as effective. He is on the donor list for a kidney ?HIV: Managed by ID and currently prescribed Biktarvy ? ?ID: Dr. Si Raider hatcher. Appointment coming up on Friday sees every 6 months. ?Cardiology: Dr. Legrand Como cooper. Every 6 months.  ?Neprologist: Dr. Madelon Lips [facility]. Hemo now was doing peritonial. MWF ?Pulmonolgy: Dr. Sherrilyn Rist for OSA ?GI: Dr. Ulice Dash Pyrtle ? ?Immunizations: ?-Tetanus: 2018 ?-Influenza:08/2021 ?-Covid-19: 4 covid vaccines ?-Shingles: [facility] ?-Pneumonia:  ? ? ?Diet: Fair diet. 1 meal a day. Not a snack. Soda and water. Can soda once a day ?Exercise: No regular exercise. ? ?Eye exam: Completes annually. Needs updateing last check was 2021. Has glasses but does not wear then. But has been wearing  ?Dental exam: Completes semi-annually [facility]  ? ?Pap Smear: Completed in  ?Mammogram: Completed in  ?Colonoscopy: Completed in 05/02/2021 ?Dexa:  ?PSA: Due ? ?Lung Cancer Screening: NA ? ? ?  01/21/2022  ?   9:54 AM 11/11/2021  ? 11:12 AM 06/27/2021  ?  3:58 PM  ?PHQ9 SCORE ONLY  ?PHQ-9 Total Score 10 11 0  ?  ? ?  01/21/2022  ?  9:55 AM  ?GAD 7 : Generalized Anxiety Score  ?Nervous, Anxious, on Edge 3  ?Control/stop worrying 0  ?Worry too much - different things 0  ?Trouble relaxing 1  ?Restless 1  ?Easily annoyed or irritable 0  ?Afraid - awful might happen 0  ?Total GAD 7 Score 5  ?Anxiety Difficulty Not difficult at all  ? ?   ?Past Medical History:  ?Diagnosis Date  ? Anemia   ? Aortic atherosclerosis ([place])   ? Arthritis   ? Asthma   ? Cancer [facility])   ? renal cyst  ? Chronic diastolic CHF (congestive heart failure) ([place]) 01/06/2017  ? Echo 7/16 [facility] in [place], [name]) Mild AI, mild LAE, mild concentric LVH, EF 55, normal wall motion, mild to moderate MR, mild PI, RVSP 55 // Echo 10/09/16 ([facility]):  Moderate LVH, grade 2 diastolic dysfunction, mild MR, moderate LAE   ? CKD (chronic kidney disease)   ? Dialysis Mon Wed Fri  ? Esophagitis   ? GERD (gastroesophageal reflux disease)   ? Gout   ? no current problems  ? Heart murmur   ? never caused any problems  ? Hepatitis   ? Hep B  ? History of nuclear stress test   ? a. Nuc study 7/16: no scar or ischemia, EF 42 //  b. Nuc study 12/17: EF 48, ?small apical ischemia, Low Risk  ? HIV (human immunodeficiency virus infection) ([place])   ? HLD (hyperlipidemia)   ? Hypertension   ? Hypertensive heart disease with CHF (congestive heart failure) ([place]) 10/09/2016  ? Hypothyroidism   ? Mitral regurgitation   ? OSA (obstructive sleep apnea)   ? Post-operative nausea and vomiting   ? 1 time as a child, no problems as an adult  ? Sleep apnea   ? uses c-pap  ? Thyroid disease   ? Wears glasses   ? Wears partial dentures   ? ? ?Past Surgical History:  ?Procedure Laterality Date  ? AV FISTULA PLACEMENT Left 07/02/2018  ? Procedure: Creation of Left arm BRACHIOBASILIC ARTERIOVENOUS  FISTULA;  Surgeon: Marty Heck, MD;  Location: [place];  Service: Vascular;  Laterality: Left;   ? BASCILIC VEIN TRANSPOSITION Left 08/27/2018  ? Procedure: Left arm BRACHIOBASILIC VEIN TRANSPOSITION SECOND STAGE;  Surgeon: Marty Heck, MD;  Location: [place];  Service: Vascular;  Laterality: Left;  ? BUBBLE STUDY  09/03/2020  ? Procedure: BUBBLE STUDY;  Surgeon: Fay Records, MD;  Location: [place];  Service: Cardiovascular;;  ? CAPD INSERTION N/A 05/30/2021  ? Procedure: LAPAROSCOPIC INSERTION CONTINUOUS AMBULATORY PERITONEAL DIALYSIS  (CAPD) CATHETER;  Surgeon: [name], MD;  Location: [place];  Service: Vascular;  Laterality: N/A;  ? CHOLECYSTECTOMY    ? COLONOSCOPY W/ BIOPSIES AND POLYPECTOMY    ? GIVENS CAPSULE STUDY N/A 03/01/2018  ? Procedure: GIVENS CAPSULE STUDY;  Surgeon: Jerene Bears, MD;  Location: [place];  Service: Gastroenterology;  Laterality: N/A;  ? HERNIA REPAIR    ? As baby  ? MULTIPLE TOOTH EXTRACTIONS    ? NEPHRECTOMY Left 02/18/2021  ? NOSE SURGERY    ? RENAL BIOPSY    ? RIGHT/LEFT HEART CATH AND CORONARY ANGIOGRAPHY N/A 09/04/2021  ? Procedure: RIGHT/LEFT HEART CATH AND CORONARY ANGIOGRAPHY;  Surgeon: Burnell Blanks, MD;  Location: [place] [facility];  Service: Cardiovascular;  Laterality: N/A;  ? TEE WITHOUT CARDIOVERSION N/A 09/03/2020  ? Procedure: TRANSESOPHAGEAL ECHOCARDIOGRAM (TEE);  Surgeon: Fay Records, MD;  Location: [place];  Service: Cardiovascular;  Laterality: N/A;  ? UPPER GI ENDOSCOPY  04/05/2021  ? ? ?Family History  ?Problem Relation Age of Onset  ? Hypertension Mother   ? Heart failure Mother   ? Hypertension Father   ? Alcohol abuse Father   ? Hypertension Sister   ? Multiple sclerosis Sister   ? Hypertension Sister   ? Hypertension Brother   ? Heart attack Maternal Grandmother 53  ? Scoliosis Other   ? Allergic rhinitis Neg Hx   ? Angioedema Neg Hx   ? Asthma Neg Hx   ? Eczema Neg Hx   ? Immunodeficiency Neg Hx   ? Urticaria Neg Hx   ? Colon cancer Neg Hx   ? Pancreatic cancer Neg Hx   ? Esophageal cancer Neg Hx   ? ? ?Social  History  ? ?Socioeconomic History  ? Marital status: Single  ?  Spouse name: Not on file  ? Number of children: Not on file  ? Years of education: Not on file  ? Highest education level: Not on file  ?Occupational History  ? Not on file  ?Tobacco Use  ? Smoking status: Former  ?  Packs/day: 1.00  ?  Years: 18.00  ?  Pack years: 18.00  ?  Types: Cigarettes  ?  Quit date:  2000  ?  Years since quitting: 23.2  ? Smokeless tobacco: Never  ?Vaping Use  ? Vaping Use: Never used  ?Substance and Sexual Activity  ? Alcohol use: Not Currently  ? Drug use: No  ? Sexual activity: Not Currently  ?  Partners: Male  ?Other Topics Concern  ? Not on file  ?Social History Narrative  ? Disability  ?   ? [place]: none  ? ?Social Determinants of Health  ? ?Financial Resource Strain: Not on file  ?Food Insecurity: Not on file  ?Transportation Needs: Not on file  ?Physical Activity: Not on file  ?Stress: Not on file  ?Social Connections: Not on file  ?Intimate Partner Violence: Not on file  ? ? ?ROS ?Review of Systems  ?Constitutional:  Positive for fatigue. Negative for chills and fever.  ?Respiratory:  Positive for cough (allergy). Negative for shortness of breath.   ?Cardiovascular:  Negative for chest pain, palpitations and leg swelling.  ?Gastrointestinal:  Negative for diarrhea, nausea and vomiting.  ?     Bm daily  ?Neurological:  Positive for numbness (bilateral arms). Negative for dizziness, weakness, light-headedness and headaches.  ?Psychiatric/Behavioral:  Negative for hallucinations and suicidal ideas.   ? ?Objective:  ? ?Today's Vitals: BP 124/78   Pulse 89   Temp 97.8 ?F (36.6 ?C)   Resp 14   Ht 5' 9.25" (1.759 m)   Wt 207 lb 6 oz (94.1 kg)   SpO2 98%   BMI 30.40 kg/m?  ? ?Physical Exam ?Vitals and nursing note reviewed.  ?Constitutional:   ?   Appearance: Normal appearance.  ?HENT:  ?   Right Ear: Tympanic membrane, ear canal and external ear normal.  ?   Left Ear: Tympanic membrane, ear canal and external ear normal.   ?   Mouth/Throat:  ?   Mouth: Mucous membranes are moist.  ?   Pharynx: Oropharynx is clear.  ?Eyes:  ?   Extraocular Movements: Extraocular movements intact.  ?   Pupils: Pupils are equal, round, and reactive t

## 2022-01-21 NOTE — Assessment & Plan Note (Signed)
Currently managed by pulmonology.  Patient states he has not used the mask an extended period of time because his gives him a cough "smothering sensation".  He does use just a nasal mask has had discussion with his pulmonologist and they have not figured out a great solution yet.  This is likely why his cords are high on his PHQ-9 as he is having difficulty with sleep which is mixed from anxiety and untreated OSA. ?

## 2022-01-21 NOTE — Assessment & Plan Note (Signed)
Currently followed by Dr. Bobby Rumpf through infectious disease.  Patient currently maintained on [place] had labs drawn approximately week ago has an appointment in 3 days to follow-up with ID.  Continue taking medications prescribed follow-up as recommended ?

## 2022-01-21 NOTE — Assessment & Plan Note (Signed)
Patient currently maintained on Flovent and albuterol inhaler.  Patient is taking Flovent as prescribed did instruct patient to wash her mouth out after each use.  States he rarely uses albuterol except for this past week uses it daily dealing with the pollen.  Continue to monitor continue current regimen.  If albuterol use still stays up we need to modify patient's regimen ?

## 2022-01-21 NOTE — Patient Instructions (Signed)
Nice to see you today ?I will be in touch with the lab results ?Follow up with me in 3-6 months for your physical, sooner if needed ?

## 2022-01-21 NOTE — Assessment & Plan Note (Signed)
Patient currently followed by [facility].  Patient is a Monday Wednesday Friday hemodialysis patient.  Still has peritoneal dialysis catheter in place states he has an appointment today to discuss removal since he is on hemodialysis.  Fistula to left upper extremity with bruit and thrill present. ? ?

## 2022-01-21 NOTE — Assessment & Plan Note (Signed)
Fistula to the left upper extremity.  Thrill and bruit present ?

## 2022-01-22 DIAGNOSIS — E876 Hypokalemia: Secondary | ICD-10-CM | POA: Diagnosis not present

## 2022-01-22 DIAGNOSIS — Z992 Dependence on renal dialysis: Secondary | ICD-10-CM | POA: Diagnosis not present

## 2022-01-22 DIAGNOSIS — D631 Anemia in chronic kidney disease: Secondary | ICD-10-CM | POA: Diagnosis not present

## 2022-01-22 DIAGNOSIS — D509 Iron deficiency anemia, unspecified: Secondary | ICD-10-CM | POA: Diagnosis not present

## 2022-01-22 DIAGNOSIS — N186 End stage renal disease: Secondary | ICD-10-CM | POA: Diagnosis not present

## 2022-01-22 DIAGNOSIS — N2581 Secondary hyperparathyroidism of renal origin: Secondary | ICD-10-CM | POA: Diagnosis not present

## 2022-01-23 ENCOUNTER — Encounter: Payer: Self-pay | Admitting: Infectious Diseases

## 2022-01-23 ENCOUNTER — Other Ambulatory Visit: Payer: Self-pay

## 2022-01-23 ENCOUNTER — Ambulatory Visit (INDEPENDENT_AMBULATORY_CARE_PROVIDER_SITE_OTHER): Payer: Medicare Other | Admitting: Infectious Diseases

## 2022-01-23 VITALS — BP 132/86 | HR 80 | Temp 98.0°F | Ht 69.0 in | Wt 207.0 lb

## 2022-01-23 DIAGNOSIS — Z79899 Other long term (current) drug therapy: Secondary | ICD-10-CM

## 2022-01-23 DIAGNOSIS — Z113 Encounter for screening for infections with a predominantly sexual mode of transmission: Secondary | ICD-10-CM

## 2022-01-23 DIAGNOSIS — I471 Supraventricular tachycardia, unspecified: Secondary | ICD-10-CM

## 2022-01-23 DIAGNOSIS — Z992 Dependence on renal dialysis: Secondary | ICD-10-CM | POA: Diagnosis not present

## 2022-01-23 DIAGNOSIS — I4891 Unspecified atrial fibrillation: Secondary | ICD-10-CM

## 2022-01-23 DIAGNOSIS — N186 End stage renal disease: Secondary | ICD-10-CM | POA: Diagnosis not present

## 2022-01-23 DIAGNOSIS — C642 Malignant neoplasm of left kidney, except renal pelvis: Secondary | ICD-10-CM

## 2022-01-23 DIAGNOSIS — B20 Human immunodeficiency virus [HIV] disease: Secondary | ICD-10-CM

## 2022-01-23 DIAGNOSIS — M9904 Segmental and somatic dysfunction of sacral region: Secondary | ICD-10-CM | POA: Diagnosis not present

## 2022-01-23 DIAGNOSIS — M9903 Segmental and somatic dysfunction of lumbar region: Secondary | ICD-10-CM | POA: Diagnosis not present

## 2022-01-23 DIAGNOSIS — M9905 Segmental and somatic dysfunction of pelvic region: Secondary | ICD-10-CM | POA: Diagnosis not present

## 2022-01-23 DIAGNOSIS — M5386 Other specified dorsopathies, lumbar region: Secondary | ICD-10-CM | POA: Diagnosis not present

## 2022-01-23 NOTE — Progress Notes (Signed)
? ?  Subjective:  ? ? Patient ID: Albert Eaton, male  DOB: 16-Aug-1966, 56 y.o.        MRN: [id number] ? ? ?HPI ?56 y.o. M with a history of CHF (nonischemic non-dilated CM with NYHA 2/C HF preserved EF 55% with moderate concentric hypertrophy and a grade 2 diastolic dysfunction. Moderate mitral valve insufficiency), CKD V (started HD 05-13-19), HTN, HLD, OSA, neuropathy, and HIV (dx 1991). Also chronic constipation (on amtyza and prn lactulose). Ashtma, GERD.  ?He had kidney bx 9-18 showing focal nephrosclerosis, interstitial fibrosis. Awaiting kidney txp.  ? ?Had nephrectomy 01-2021 due to ?cyst on his kidney. It was found to be Cancer. Surgery at [facility].  ?       - RENAL CELL CARCINOMA, PAPILLARY RENAL CELL CARCINOMA , TYPE 1, pT1a pNx ?- RENAL CELL CARCINOMA, ACQUIRED CYSTIC DISEASE ASSOCIATED RENAL CELL CARCINOMA, pT1a, pNx.  ?  ?Has f/u with oncology and repeat CT   ?Nephrologist- Dr Rodman Key.  ?Was adm to hospital 11-2021 with afib (converted on cadizem) and SOB, n/v after starting home PD.  ?Is back to HD now. No problems with fistula.  ?No problems with ART (biktarvy) ?Rare problems with his asthma.  ?On renal txp list in [place]/[facility], [facility].  ?BP has been "pretty good" at home.  ? ? ?CD4 486, HIV RNA 11,900.  ?Off all meds during and after hospital. Back on ART now.  ? ?HIV 1 RNA Quant (Copies/mL)  ?Date Value  ?01/13/2022 11,900 (H)  ?06/27/2021 Not Detected  ?06/27/2020 <20  ? ?CD4 T Cell Abs (/uL)  ?Date Value  ?06/27/2021 686  ?06/27/2020 1,175  ?10/06/2019 916  ? ? ? ?Health Maintenance  ?Topic Date Due  ? Zoster Vaccines- Shingrix (1 of 2) Never done  ? COVID-19 Vaccine (5 - Booster for Pfizer series) 07/23/2021  ? COLONOSCOPY (Pts 45-28yr Insurance coverage will need to be confirmed)  05/02/2026  ? TETANUS/TDAP  01/28/2027  ? Hepatitis C Screening  Completed  ? HIV Screening  Completed  ? HPV VACCINES  Aged Out  ? ? ? ? ?Review of Systems  ?Constitutional:  Negative for chills, fever and weight loss.   ?HENT:  Negative for nosebleeds.   ?     No gum bleeds  ?Respiratory:  Positive for cough and shortness of breath.   ?Cardiovascular:  Positive for palpitations (only when he misses metoprolol). Negative for chest pain and leg swelling.  ?Gastrointestinal:  Positive for constipation (takes rx or drinks juic to resolve.). Negative for blood in stool and diarrhea.  ?Neurological:  Negative for headaches.  ? ?Please see HPI. All other systems reviewed and negative. ? ?   ?Objective:  ?Physical Exam ? ? ? ? ? ?   ?Assessment & Plan:  ? ?

## 2022-01-23 NOTE — Assessment & Plan Note (Addendum)
He continues on HD ?My great appreciation to Dr Hollie Salk.  ?On txp list ?

## 2022-01-23 NOTE — Assessment & Plan Note (Signed)
Appears to be doing well, as long as he takes his rx.  ?

## 2022-01-23 NOTE — Assessment & Plan Note (Signed)
Appreciate Dr Bishop Dublin f/u ?He states he gets yearly scan.  ?

## 2022-01-23 NOTE — Assessment & Plan Note (Signed)
He is doing well ?VL up after being off meds.  ?He deferred getting repeat today.  ?Offered/refused condoms.  ?His vax appear to be up to date (could not find his vax card).  ?rtc in 6 months.  ?

## 2022-01-23 NOTE — Assessment & Plan Note (Signed)
He appears to be doing well as long he stays on his rx.  ?

## 2022-01-24 ENCOUNTER — Other Ambulatory Visit: Payer: Self-pay

## 2022-01-24 DIAGNOSIS — D631 Anemia in chronic kidney disease: Secondary | ICD-10-CM | POA: Diagnosis not present

## 2022-01-24 DIAGNOSIS — I129 Hypertensive chronic kidney disease with stage 1 through stage 4 chronic kidney disease, or unspecified chronic kidney disease: Secondary | ICD-10-CM | POA: Diagnosis not present

## 2022-01-24 DIAGNOSIS — Z992 Dependence on renal dialysis: Secondary | ICD-10-CM | POA: Diagnosis not present

## 2022-01-24 DIAGNOSIS — N186 End stage renal disease: Secondary | ICD-10-CM | POA: Diagnosis not present

## 2022-01-24 DIAGNOSIS — N2581 Secondary hyperparathyroidism of renal origin: Secondary | ICD-10-CM | POA: Diagnosis not present

## 2022-01-24 DIAGNOSIS — D509 Iron deficiency anemia, unspecified: Secondary | ICD-10-CM | POA: Diagnosis not present

## 2022-01-27 ENCOUNTER — Other Ambulatory Visit: Payer: Self-pay | Admitting: Infectious Diseases

## 2022-01-27 DIAGNOSIS — N2581 Secondary hyperparathyroidism of renal origin: Secondary | ICD-10-CM | POA: Diagnosis not present

## 2022-01-27 DIAGNOSIS — Z992 Dependence on renal dialysis: Secondary | ICD-10-CM | POA: Diagnosis not present

## 2022-01-27 DIAGNOSIS — D509 Iron deficiency anemia, unspecified: Secondary | ICD-10-CM | POA: Diagnosis not present

## 2022-01-27 DIAGNOSIS — E876 Hypokalemia: Secondary | ICD-10-CM | POA: Diagnosis not present

## 2022-01-27 DIAGNOSIS — B20 Human immunodeficiency virus [HIV] disease: Secondary | ICD-10-CM

## 2022-01-27 DIAGNOSIS — N186 End stage renal disease: Secondary | ICD-10-CM | POA: Diagnosis not present

## 2022-01-27 DIAGNOSIS — D631 Anemia in chronic kidney disease: Secondary | ICD-10-CM | POA: Diagnosis not present

## 2022-01-29 DIAGNOSIS — Z992 Dependence on renal dialysis: Secondary | ICD-10-CM | POA: Diagnosis not present

## 2022-01-29 DIAGNOSIS — D631 Anemia in chronic kidney disease: Secondary | ICD-10-CM | POA: Diagnosis not present

## 2022-01-29 DIAGNOSIS — N186 End stage renal disease: Secondary | ICD-10-CM | POA: Diagnosis not present

## 2022-01-29 DIAGNOSIS — N2581 Secondary hyperparathyroidism of renal origin: Secondary | ICD-10-CM | POA: Diagnosis not present

## 2022-01-29 DIAGNOSIS — E876 Hypokalemia: Secondary | ICD-10-CM | POA: Diagnosis not present

## 2022-01-29 DIAGNOSIS — D509 Iron deficiency anemia, unspecified: Secondary | ICD-10-CM | POA: Diagnosis not present

## 2022-02-03 DIAGNOSIS — Z992 Dependence on renal dialysis: Secondary | ICD-10-CM | POA: Diagnosis not present

## 2022-02-03 DIAGNOSIS — D509 Iron deficiency anemia, unspecified: Secondary | ICD-10-CM | POA: Diagnosis not present

## 2022-02-03 DIAGNOSIS — N2581 Secondary hyperparathyroidism of renal origin: Secondary | ICD-10-CM | POA: Diagnosis not present

## 2022-02-03 DIAGNOSIS — N186 End stage renal disease: Secondary | ICD-10-CM | POA: Diagnosis not present

## 2022-02-03 DIAGNOSIS — D631 Anemia in chronic kidney disease: Secondary | ICD-10-CM | POA: Diagnosis not present

## 2022-02-03 DIAGNOSIS — E876 Hypokalemia: Secondary | ICD-10-CM | POA: Diagnosis not present

## 2022-02-05 DIAGNOSIS — D631 Anemia in chronic kidney disease: Secondary | ICD-10-CM | POA: Diagnosis not present

## 2022-02-05 DIAGNOSIS — N2581 Secondary hyperparathyroidism of renal origin: Secondary | ICD-10-CM | POA: Diagnosis not present

## 2022-02-05 DIAGNOSIS — Z992 Dependence on renal dialysis: Secondary | ICD-10-CM | POA: Diagnosis not present

## 2022-02-05 DIAGNOSIS — N186 End stage renal disease: Secondary | ICD-10-CM | POA: Diagnosis not present

## 2022-02-05 DIAGNOSIS — D509 Iron deficiency anemia, unspecified: Secondary | ICD-10-CM | POA: Diagnosis not present

## 2022-02-05 DIAGNOSIS — E876 Hypokalemia: Secondary | ICD-10-CM | POA: Diagnosis not present

## 2022-02-07 DIAGNOSIS — Z992 Dependence on renal dialysis: Secondary | ICD-10-CM | POA: Diagnosis not present

## 2022-02-07 DIAGNOSIS — D509 Iron deficiency anemia, unspecified: Secondary | ICD-10-CM | POA: Diagnosis not present

## 2022-02-07 DIAGNOSIS — N186 End stage renal disease: Secondary | ICD-10-CM | POA: Diagnosis not present

## 2022-02-07 DIAGNOSIS — N2581 Secondary hyperparathyroidism of renal origin: Secondary | ICD-10-CM | POA: Diagnosis not present

## 2022-02-07 DIAGNOSIS — D631 Anemia in chronic kidney disease: Secondary | ICD-10-CM | POA: Diagnosis not present

## 2022-02-07 DIAGNOSIS — E876 Hypokalemia: Secondary | ICD-10-CM | POA: Diagnosis not present

## 2022-02-09 ENCOUNTER — Emergency Department (HOSPITAL_COMMUNITY): Payer: Medicare Other

## 2022-02-09 ENCOUNTER — Emergency Department (HOSPITAL_COMMUNITY)
Admission: EM | Admit: 2022-02-09 | Discharge: 2022-02-10 | Discharge status: Against Medical Advice | Payer: Medicare Other | Attending: Physician Assistant | Admitting: Physician Assistant

## 2022-02-09 DIAGNOSIS — R062 Wheezing: Secondary | ICD-10-CM | POA: Diagnosis not present

## 2022-02-09 DIAGNOSIS — Z5321 Procedure and treatment not carried out due to patient leaving prior to being seen by health care provider: Secondary | ICD-10-CM | POA: Diagnosis not present

## 2022-02-09 DIAGNOSIS — J45909 Unspecified asthma, uncomplicated: Secondary | ICD-10-CM | POA: Diagnosis not present

## 2022-02-09 DIAGNOSIS — R0602 Shortness of breath: Secondary | ICD-10-CM | POA: Diagnosis not present

## 2022-02-09 LAB — COMPREHENSIVE METABOLIC PANEL
ALT: 17 U/L (ref 0–44)
AST: 19 U/L (ref 15–41)
Albumin: 3.7 g/dL (ref 3.5–5.0)
Alkaline Phosphatase: 66 U/L (ref 38–126)
Anion gap: 14 (ref 5–15)
BUN: 40 mg/dL — ABNORMAL HIGH (ref 6–20)
CO2: 22 mmol/L (ref 22–32)
Calcium: 9.4 mg/dL (ref 8.9–10.3)
Chloride: 102 mmol/L (ref 98–111)
Creatinine, Ser: 11.33 mg/dL — ABNORMAL HIGH (ref 0.61–1.24)
GFR, Estimated: 5 mL/min — ABNORMAL LOW (ref >60)
Glucose, Bld: 101 mg/dL — ABNORMAL HIGH (ref 70–99)
Potassium: 3.8 mmol/L (ref 3.5–5.1)
Sodium: 138 mmol/L (ref 135–145)
Total Bilirubin: 0.3 mg/dL (ref 0.3–1.2)
Total Protein: 7.1 g/dL (ref 6.5–8.1)

## 2022-02-09 LAB — CBC WITH DIFFERENTIAL/PLATELET
Abs Immature Granulocytes: 0.04 10*3/uL (ref 0.00–0.07)
Basophils Absolute: 0 10*3/uL (ref 0.0–0.1)
Basophils Relative: 0 %
Eosinophils Absolute: 0.2 10*3/uL (ref 0.0–0.5)
Eosinophils Relative: 2 %
HCT: 25.8 % — ABNORMAL LOW (ref 39.0–52.0)
Hemoglobin: 8.5 g/dL — ABNORMAL LOW (ref 13.0–17.0)
Immature Granulocytes: 1 %
Lymphocytes Relative: 30 %
Lymphs Abs: 2.6 10*3/uL (ref 0.7–4.0)
MCH: 32.1 pg (ref 26.0–34.0)
MCHC: 32.9 g/dL (ref 30.0–36.0)
MCV: 97.4 fL (ref 80.0–100.0)
Monocytes Absolute: 0.7 10*3/uL (ref 0.1–1.0)
Monocytes Relative: 8 %
Neutro Abs: 5.3 10*3/uL (ref 1.7–7.7)
Neutrophils Relative %: 59 %
Platelets: 270 10*3/uL (ref 150–400)
RBC: 2.65 MIL/uL — ABNORMAL LOW (ref 4.22–5.81)
RDW: 14.2 % (ref 11.5–15.5)
WBC: 8.8 10*3/uL (ref 4.0–10.5)
nRBC: 0 % (ref 0.0–0.2)

## 2022-02-09 LAB — MAGNESIUM: Magnesium: 1.8 mg/dL (ref 1.7–2.4)

## 2022-02-09 IMAGING — DX DG CHEST 2V
2 series · 2 of 2 positions shown · non-contrast
Comparison: [DATE]

CLINICAL DATA: Short of breath, asthma

EXAM:
CHEST - 2 VIEW

[chest pa]
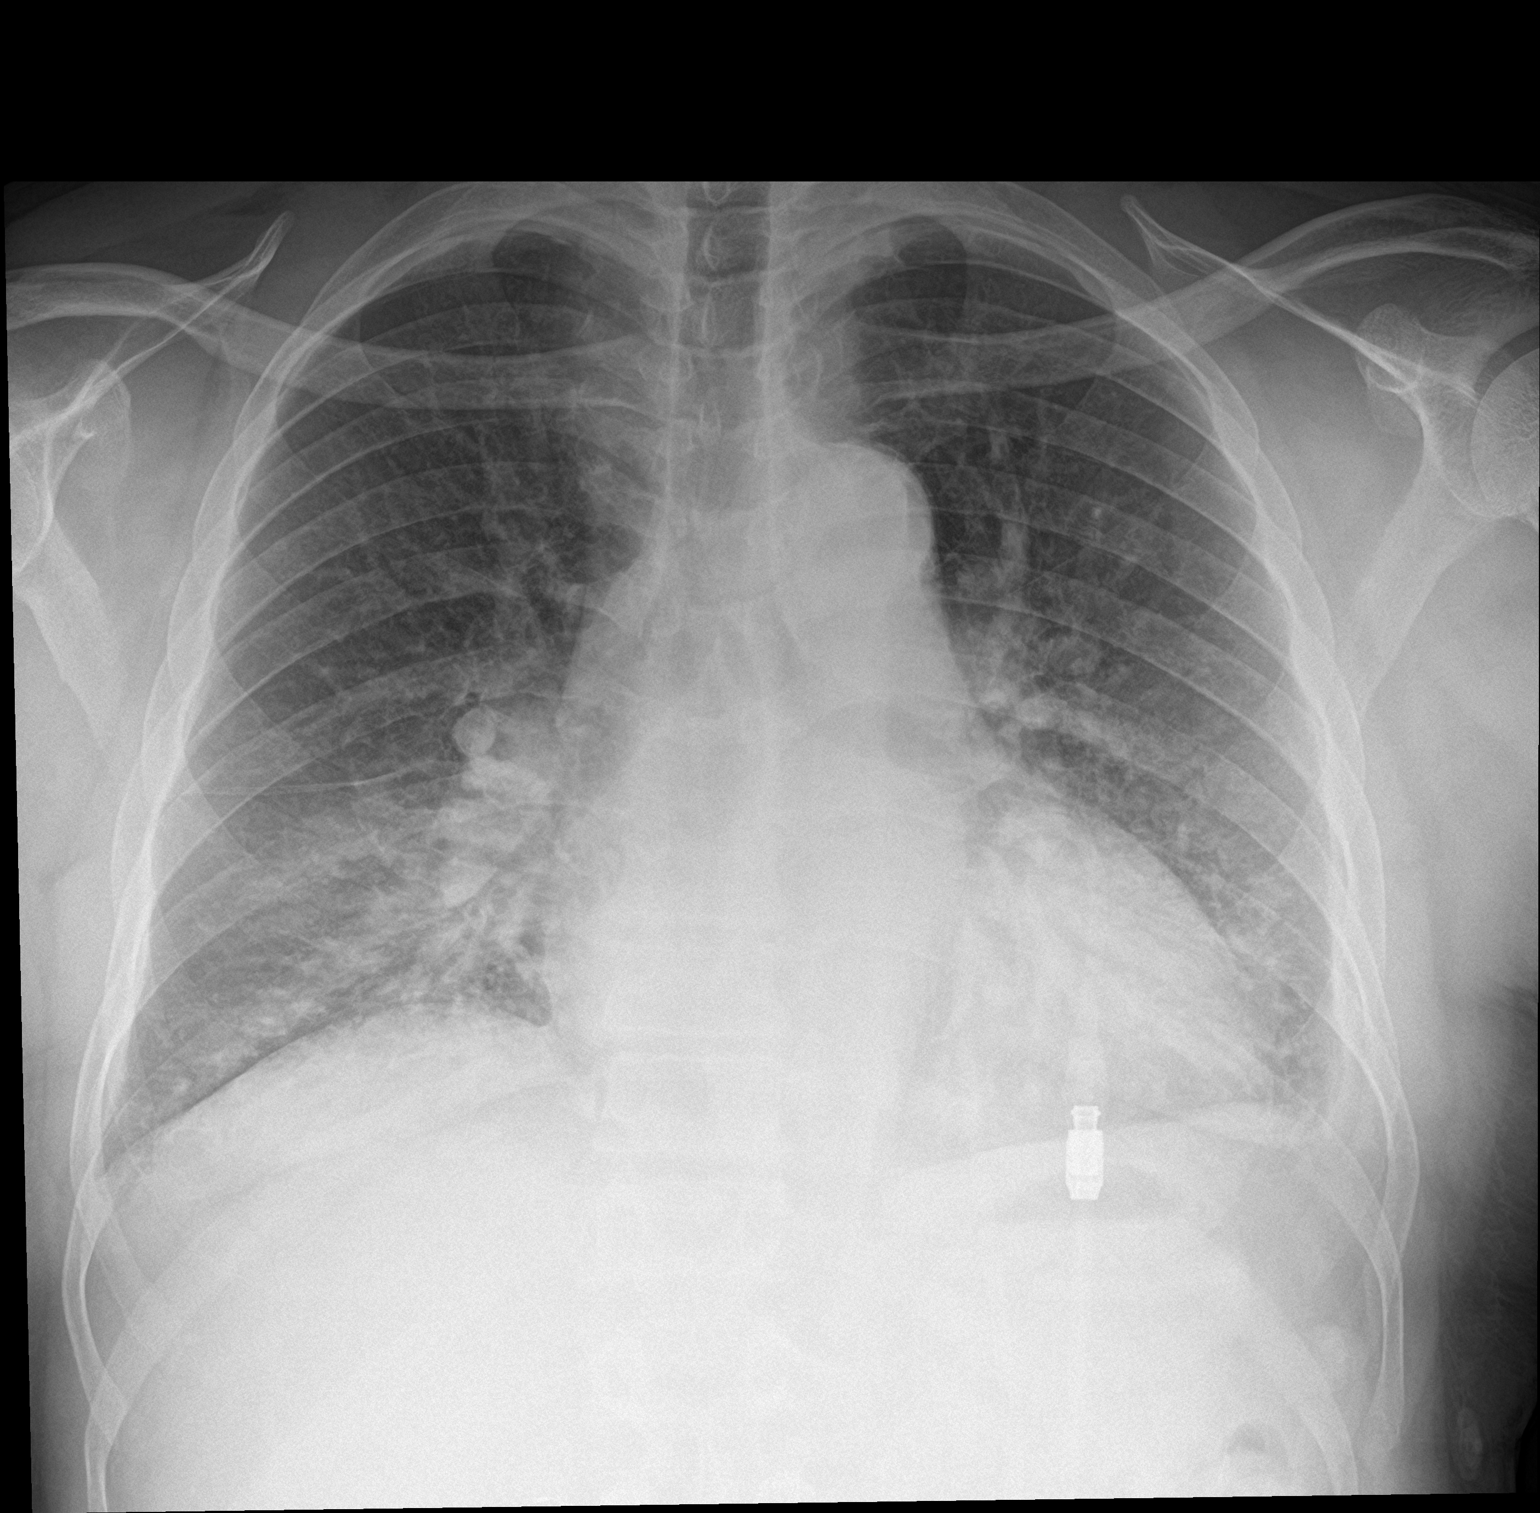

[chest lat]
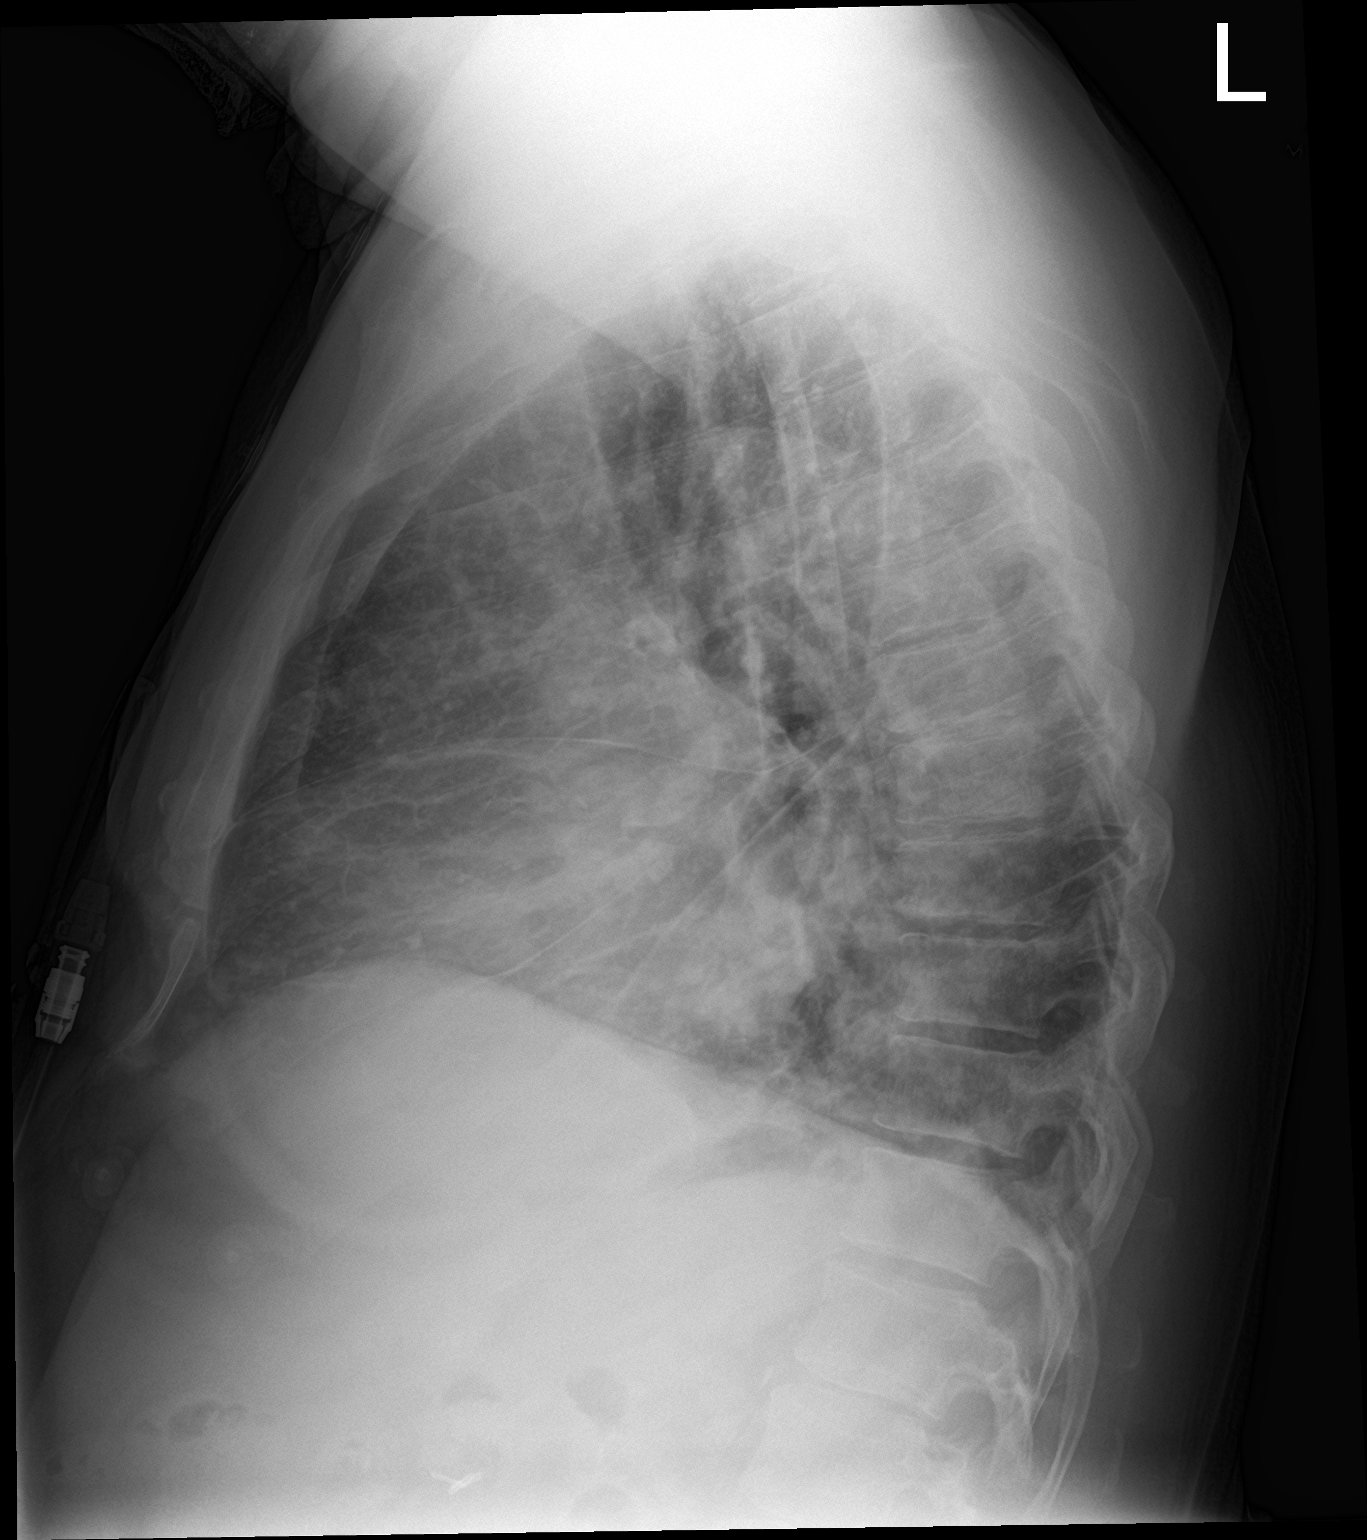

[2 of 2 positions shown; findings below may reference images not displayed]

FINDINGS: Frontal and lateral views of the chest demonstrate stable
enlargement of the cardiac silhouette. There is increased central
vascular congestion, with patchy bibasilar ground-glass airspace
disease and interstitial prominence consistent with edema. Trace
bilateral effusions, left greater than right. No pneumothorax.
Peritoneal dialysis catheter within the left anterior abdominal
wall.
IMPRESSION: 1. Findings most consistent with mild congestive heart failure.

## 2022-02-09 MED ORDER — ALBUTEROL SULFATE (2.5 MG/3ML) 0.083% IN NEBU
INHALATION_SOLUTION | RESPIRATORY_TRACT | Status: AC
  Filled 2022-02-09: qty 3

## 2022-02-09 MED ORDER — ALBUTEROL SULFATE (2.5 MG/3ML) 0.083% IN NEBU
2.5000 mg | INHALATION_SOLUTION | Freq: Once | RESPIRATORY_TRACT | Status: AC
  Administered 2022-02-09: 2.5 mg via RESPIRATORY_TRACT

## 2022-02-09 NOTE — ED Provider Triage Note (Signed)
Emergency Medicine Provider Triage Evaluation Note ? ?Albert Eaton Anes , a 57 y.o. male  was evaluated in triage.  Pt complains of shortness of breath.  He states the last time his breathing was this bad was last week.  He attributes this due to allergies. ? ? ?Physical Exam  ?BP (!) 149/100 (BP Location: Right Arm)   Pulse 90   Temp 98.2 ?F (36.8 ?C) (Oral)   Resp (!) 26   SpO2 96%  ?Gen:   Awake, no distress   ?Resp:  Increased effort.  Diffuse wheezes  ?MSK:   Moves extremities without difficulty  ?Other:  Normal speech ? ?Medical Decision Making  ?Medically screening exam initiated at 10:46 PM.  Appropriate orders placed.  Albert Eaton was informed that the remainder of the evaluation will be completed by another provider, this initial triage assessment does not replace that evaluation, and the importance of remaining in the ED until their evaluation is complete. ? ? ?  ?Lorin Glass, PA-C ?02/09/22 2253 ? ?

## 2022-02-09 NOTE — ED Triage Notes (Signed)
Pt presented to ED with c/o asthma attack. Pt noted to have shortness of breath with constant pauses in sentences to catch breath.  ?

## 2022-02-10 ENCOUNTER — Ambulatory Visit: Payer: Medicare Other | Admitting: Nurse Practitioner

## 2022-02-10 ENCOUNTER — Ambulatory Visit: Payer: Medicare Other | Admitting: Pulmonary Disease

## 2022-02-10 DIAGNOSIS — N186 End stage renal disease: Secondary | ICD-10-CM | POA: Diagnosis not present

## 2022-02-10 DIAGNOSIS — D509 Iron deficiency anemia, unspecified: Secondary | ICD-10-CM | POA: Diagnosis not present

## 2022-02-10 DIAGNOSIS — E876 Hypokalemia: Secondary | ICD-10-CM | POA: Diagnosis not present

## 2022-02-10 DIAGNOSIS — N2581 Secondary hyperparathyroidism of renal origin: Secondary | ICD-10-CM | POA: Diagnosis not present

## 2022-02-10 DIAGNOSIS — Z992 Dependence on renal dialysis: Secondary | ICD-10-CM | POA: Diagnosis not present

## 2022-02-10 DIAGNOSIS — D631 Anemia in chronic kidney disease: Secondary | ICD-10-CM | POA: Diagnosis not present

## 2022-02-10 NOTE — ED Notes (Signed)
Patient states he has dialysis in the morning and will follow up with a doctor there.  ?

## 2022-02-11 ENCOUNTER — Encounter (HOSPITAL_COMMUNITY): Payer: Self-pay | Admitting: Vascular Surgery

## 2022-02-11 ENCOUNTER — Other Ambulatory Visit: Payer: Self-pay

## 2022-02-11 NOTE — Progress Notes (Signed)
PCP - Karl Ito, NP ?Cardiologist - Dr. [name], Dr. Quentin Ore ?EKG - 02/10/22 ?Chest x-ray -  ?ECHO - 12/20/21 ?Cardiac Cath - 09/04/21 ?CPAP - yes - pt does not wear d/t feeling suffocated. Had recent appt for CPAP mask.  ? ?Blood Thinner Instructions:  ?Follow your surgeon's instructions on when to stop Aspirin and apixaban (ELIQUIS).  If no instructions were given by your surgeon then you will need to call the office to get those instructions.   ?Aspirin Instructions:  ?ERAS Protcol -  ?COVID TEST-  ? ?Anesthesia review: yes ? ?------------- ? ?SDW INSTRUCTIONS: ? ?Your procedure is scheduled on Thurs 4/20. Please report to [facility] Main Entrance "A" at 07:30 A.M., and check in at the Admitting office. Call this number if you have problems the morning of surgery: [phone] ? ? ?Remember: Do not eat or drink after midnight the night before your surgery ?  ?Medications to take morning of surgery with a sip of water include: ?ACETAMINOPHEN if needed ?allopurinol (ZYLOPRIM)  ?amLODipine (NORVASC)  ?BIKTARVY  ?chlorproMAZINE (THORAZINE)  ?cinacalcet (SENSIPAR)  ?famotidine (PEPCID) if needed ?gabapentin (NEURONTIN)  ?hydrALAZINE (APRESOLINE)  ?levothyroxine (SYNTHROID) ?metoprolol tartrate (LOPRESSOR)  ?rosuvastatin (CRESTOR)  ? ?Please bring all inhalers with you the day of surgery.  ? ?Follow your surgeon's instructions on when to stop Aspirin and apixaban (ELIQUIS).  If no instructions were given by your surgeon then you will need to call the office to get those instructions.   ? ?As of today, STOP taking any Aleve, Naproxen, Ibuprofen, Motrin, Advil, Goody's, BC's, all herbal medications, fish oil, and all vitamins. ? ?  ?The Morning of Surgery ?Do not wear jewelry ?Do not wear lotions, powders, colognes, or deodorant ?Do not bring valuables to the hospital. ?[facility] is not responsible for any belongings or valuables. ? ?If you are a smoker, DO NOT Smoke 24 hours prior to surgery ? ?If you wear a CPAP at  night please bring your mask the morning of surgery  ? ?Remember that you must have someone to transport you home after your surgery, and remain with you for 24 hours if you are discharged the same day. ? ?Please bring cases for contacts, glasses, hearing aids, dentures or bridgework because it cannot be worn into surgery.  ? ?Patients discharged the day of surgery will not be allowed to drive home.  ? ?Please shower the NIGHT BEFORE/MORNING OF SURGERY (use antibacterial soap like DIAL soap if possible). Wear comfortable clothes the morning of surgery. Oral Hygiene is also important to reduce your risk of infection.  Remember - BRUSH YOUR TEETH THE MORNING OF SURGERY WITH YOUR REGULAR TOOTHPASTE ? ?Patient denies shortness of breath, fever, cough and chest pain.  ? ? ?   ? ?

## 2022-02-12 ENCOUNTER — Telehealth: Payer: Self-pay | Admitting: Nurse Practitioner

## 2022-02-12 ENCOUNTER — Other Ambulatory Visit: Payer: Self-pay | Admitting: Cardiovascular Disease

## 2022-02-12 DIAGNOSIS — N186 End stage renal disease: Secondary | ICD-10-CM | POA: Diagnosis not present

## 2022-02-12 DIAGNOSIS — E876 Hypokalemia: Secondary | ICD-10-CM | POA: Diagnosis not present

## 2022-02-12 DIAGNOSIS — D509 Iron deficiency anemia, unspecified: Secondary | ICD-10-CM | POA: Diagnosis not present

## 2022-02-12 DIAGNOSIS — D631 Anemia in chronic kidney disease: Secondary | ICD-10-CM | POA: Diagnosis not present

## 2022-02-12 DIAGNOSIS — Z992 Dependence on renal dialysis: Secondary | ICD-10-CM | POA: Diagnosis not present

## 2022-02-12 DIAGNOSIS — N2581 Secondary hyperparathyroidism of renal origin: Secondary | ICD-10-CM | POA: Diagnosis not present

## 2022-02-12 NOTE — Telephone Encounter (Signed)
Patient advised.

## 2022-02-12 NOTE — Anesthesia Preprocedure Evaluation (Addendum)
Anesthesia Evaluation  ?Patient identified by MRN, date of birth, ID band ?Patient awake ? ? ? ?Reviewed: ?Allergy & Precautions, NPO status , Patient's Chart, lab work & pertinent test results ? ?History of Anesthesia Complications ?(+) PONV and history of anesthetic complications ? ?Airway ?Mallampati: II ? ?TM Distance: >3 FB ?Neck ROM: Full ? ? ? Dental ?no notable dental hx. ?(+) Missing, Chipped, Dental Advisory Given,  ?  ?Pulmonary ?asthma , sleep apnea and Continuous Positive Airway Pressure Ventilation , former smoker,  ?  ?Pulmonary exam normal ?breath sounds clear to auscultation ? ? ? ? ? ? Cardiovascular ?hypertension, Pt. on medications ?+ CAD, + Past MI and +CHF  ?Normal cardiovascular exam+ Valvular Problems/Murmurs MR  ?Rhythm:Regular Rate:Normal ? ?EKG 04/01/21: Normal sinus rhythm. Rate 86. ?Prolonged QTc 509 ?? ?Echocardiogram 03/04/21 ?EF 60-65, no RWMA, mild LVH, normal RVSF, RVSP 44.2, severe LAE, mild RAE, mod to severe MR, asc Ao 39 mm ?? ?LONG TERM MONITOR (8-14 DAYS) INTERPRETATION 12/28/2020 ?Study review shows an average heart rate of 85 bpm with the predominant rhythm is normal sinus. ?There are long runs of SVT present with heart rates ranging from 145 to 210 bpm. ?There are occasional PVCs and nonsustained ventricular runs. ?We will review medical program. ?Recommend EP referral. ? ?CT CORONARY MORPH W/CTA COR W/SCORE W/CA W/CM &/OR WO/CM 10/10/2020 ?1. Coronary calcium score of 437. This was 98th percentile for age ?and sex matched control. ?2. ?Normal coronary origin with left dominance. ?3. ?Mild atherosclerosis. ?CAD RADS 2. ?IMPRESSION: ?1. Coronary CTA FFR flow analysis demonstrates no hemodynamically flow limiting lesion ?  ?Neuro/Psych ?negative neurological ROS ? negative psych ROS  ? GI/Hepatic ?GERD  ,(+) Hepatitis -, B  ?Endo/Other  ?Hypothyroidism  ? Renal/GU ?ESRF and DialysisRenal disease (Cr 9.40, K 3.5, dialyis MWF, completed dialysis  yesterday)RCC s/p left nephrectomy 01/2021  ?negative genitourinary ?  ?Musculoskeletal ? ?(+) Arthritis ,  ? Abdominal ?  ?Peds ? Hematology ? ?(+) Blood dyscrasia, anemia , HIV,   ?Anesthesia Other Findings ? ? Reproductive/Obstetrics ? ?  ? ? ? ? ? ? ? ? ? ? ? ? ? ?  ?  ? ? ? ? ? ? ? ?Anesthesia Physical ? ?Anesthesia Plan ? ?ASA: 3 ? ?Anesthesia Plan: General  ? ?Post-op Pain Management: Tylenol PO (pre-op)* and Minimal or no pain anticipated  ? ?Induction: Intravenous ? ?PONV Risk Score and Plan: 4 or greater and Midazolam, Dexamethasone, Ondansetron and Treatment may vary due to age or medical condition ? ?Airway Management Planned: Oral ETT ? ?Additional Equipment: None ? ?Intra-op Plan:  ? ?Post-operative Plan: Extubation in OR ? ?Informed Consent: I have reviewed the patients History and Physical, chart, labs and discussed the procedure including the risks, benefits and alternatives for the proposed anesthesia with the patient or authorized representative who has indicated his/her understanding and acceptance.  ? ? ? ?Dental advisory given ? ?Plan Discussed with: CRNA ? ?Anesthesia Plan Comments: ( ? ?)  ? ? ? ? ? ?Anesthesia Quick Evaluation ? ?

## 2022-02-12 NOTE — Telephone Encounter (Signed)
Pt called stating that they prescribed him medication apixaban (ELIQUIS) 5 MG TABS tablet in the hospital. Pt is asking should he continue taking medication. Please advise. ?

## 2022-02-12 NOTE — Progress Notes (Signed)
Anesthesia Chart Review: ?Same day workup ? ?Follows with cardiology for history of HFpEF, moderate severe MR with recommendation to treat medically (echo 11/2021 EF 50 to 55%, moderate MR), nonobstructive CAD by cath 08/2021, HTN, HLD, OSA (intolerant to CPAP), atrial tachycardia being managed conservatively, PAF on eliquis.  Last seen by Dr. [name] 12/20/2021.  Per note, "The patient continues to exhibit New York Heart Association functional class II symptoms chronic diastolic heart failure in the setting of end-stage renal disease, nonischemic cardiomyopathy, and moderately severe mitral regurgitation.  It appears he is going to transition from peritoneal to hemodialysis over the next few weeks and I think it would be important to reassess cardiac function and degree of mitral regurgitation once he is fully euvolemic and treated with hemodialysis.  We will plan to see him back for cardiology follow-up in 6 months with a repeat echocardiogram.  I did review today's study and await the formal report.  His overall degree of mitral regurgitation appears stable in the moderately severe range." ? ?Renal Cell CA s/p L nephrectomy 01/2021. ESRD previously on peritoneal dialysis. Peritoneal dialysis not working well for patient, needs catheter removed, transitioned back to HD.  Followed by Dr. Hollie Salk with nephrology. ? ?Hx of HIV, followed by ID, maintained on Biktarvy.  ? ?Pt was seen at ED 02/09/22 for asthma exacerbation. Left before completing evaluation. Per PAT RN, pt denied SOB on preop phone call.  ? ?Will need DOS labs and eval. ? ?EKG 02/09/22: Normal sinus rhythm. Rate 88. Prolonged QT (QTcB 522) ? ?CHEST - 2 VIEW 02/09/22: ?COMPARISON:  12/02/2021 ?  ?FINDINGS: ?Frontal and lateral views of the chest demonstrate stable ?enlargement of the cardiac silhouette. There is increased central ?vascular congestion, with patchy bibasilar ground-glass airspace ?disease and interstitial prominence consistent with edema.  Trace ?bilateral effusions, left greater than right. No pneumothorax. ?Peritoneal dialysis catheter within the left anterior abdominal ?wall. ?  ?IMPRESSION: ?1. Findings most consistent with mild congestive heart failure. ? ?TTE 12/20/21: ? 1. Left ventricular ejection fraction, by estimation, is 50 to 55%. The  ?left ventricle has low normal function. The left ventricle has no regional  ?wall motion abnormalities. The left ventricular internal cavity size was  ?mildly dilated. There is mild  ?left ventricular hypertrophy. Left ventricular diastolic parameters were  ?normal.  ? 2. Right ventricular systolic function is normal. The right ventricular  ?size is normal. There is normal pulmonary artery systolic pressure. The  ?estimated right ventricular systolic pressure is 50.5 mmHg.  ? 3. The aortic valve is tricuspid. Aortic valve regurgitation is trivial.  ?No aortic stenosis is present.  ? 4. Aortic dilatation noted. There is mild dilatation of the ascending  ?aorta, measuring 39 mm.  ? 5. The inferior vena cava is normal in size with greater than 50%  ?respiratory variability, suggesting right atrial pressure of 3 mmHg.  ? 6. The mitral valve is abnormal. Posterior directed MR jet. LV is mildy  ?dilated, but normal LA size and low E wave velocity with E<A suggests MR  ?is not severe. Moderate mitral valve regurgitation. ? ?R/L Cath 09/04/21: ?  Prox RCA lesion is 50% stenosed. ?  Mid LAD lesion is 50% stenosed. ?  Prox LAD lesion is 40% stenosed. ?  2nd Mrg lesion is 30% stenosed. ?  ?Mild to moderate non-obstructive CAD ?Normal right and left heart pressures ?  ?Continue medical management of CAD.  ? ? ? ? ?Karoline Caldwell, PA-C ?[facility]/Anesthesiology ?Phone [phone] ?  02/12/2022 10:33 AM ? ?

## 2022-02-12 NOTE — Telephone Encounter (Signed)
This is a question that needs to be answered by patient cardiologist, Dr. Legrand Como cooper ?

## 2022-02-13 ENCOUNTER — Ambulatory Visit (HOSPITAL_COMMUNITY): Payer: Medicare Other | Admitting: Physician Assistant

## 2022-02-13 ENCOUNTER — Other Ambulatory Visit: Payer: Self-pay

## 2022-02-13 ENCOUNTER — Encounter (HOSPITAL_COMMUNITY): Payer: Self-pay | Admitting: Vascular Surgery

## 2022-02-13 ENCOUNTER — Encounter (HOSPITAL_COMMUNITY)
Admission: RE | Disposition: A | Discharge status: Home / Self Care | Payer: Self-pay | Source: Home / Self Care | Attending: Vascular Surgery

## 2022-02-13 ENCOUNTER — Ambulatory Visit (HOSPITAL_COMMUNITY)
Admission: RE | Admit: 2022-02-13 | Discharge: 2022-02-13 | Disposition: A | Discharge status: Home / Self Care | Payer: Medicare Other | Attending: Vascular Surgery | Admitting: Vascular Surgery

## 2022-02-13 ENCOUNTER — Other Ambulatory Visit (HOSPITAL_COMMUNITY): Payer: Self-pay

## 2022-02-13 ENCOUNTER — Ambulatory Visit (HOSPITAL_BASED_OUTPATIENT_CLINIC_OR_DEPARTMENT_OTHER): Payer: Medicare Other | Admitting: Physician Assistant

## 2022-02-13 DIAGNOSIS — Z905 Acquired absence of kidney: Secondary | ICD-10-CM | POA: Insufficient documentation

## 2022-02-13 DIAGNOSIS — Z87891 Personal history of nicotine dependence: Secondary | ICD-10-CM | POA: Diagnosis not present

## 2022-02-13 DIAGNOSIS — I252 Old myocardial infarction: Secondary | ICD-10-CM | POA: Insufficient documentation

## 2022-02-13 DIAGNOSIS — I132 Hypertensive heart and chronic kidney disease with heart failure and with stage 5 chronic kidney disease, or end stage renal disease: Secondary | ICD-10-CM

## 2022-02-13 DIAGNOSIS — K219 Gastro-esophageal reflux disease without esophagitis: Secondary | ICD-10-CM | POA: Insufficient documentation

## 2022-02-13 DIAGNOSIS — N186 End stage renal disease: Secondary | ICD-10-CM | POA: Insufficient documentation

## 2022-02-13 DIAGNOSIS — I509 Heart failure, unspecified: Secondary | ICD-10-CM | POA: Insufficient documentation

## 2022-02-13 DIAGNOSIS — I5032 Chronic diastolic (congestive) heart failure: Secondary | ICD-10-CM | POA: Diagnosis not present

## 2022-02-13 DIAGNOSIS — I2510 Atherosclerotic heart disease of native coronary artery without angina pectoris: Secondary | ICD-10-CM | POA: Diagnosis not present

## 2022-02-13 DIAGNOSIS — D631 Anemia in chronic kidney disease: Secondary | ICD-10-CM | POA: Diagnosis not present

## 2022-02-13 DIAGNOSIS — T85611A Breakdown (mechanical) of intraperitoneal dialysis catheter, initial encounter: Secondary | ICD-10-CM

## 2022-02-13 DIAGNOSIS — Z992 Dependence on renal dialysis: Secondary | ICD-10-CM | POA: Diagnosis not present

## 2022-02-13 DIAGNOSIS — E039 Hypothyroidism, unspecified: Secondary | ICD-10-CM | POA: Diagnosis not present

## 2022-02-13 DIAGNOSIS — B20 Human immunodeficiency virus [HIV] disease: Secondary | ICD-10-CM

## 2022-02-13 DIAGNOSIS — Z4901 Encounter for fitting and adjustment of extracorporeal dialysis catheter: Secondary | ICD-10-CM | POA: Insufficient documentation

## 2022-02-13 DIAGNOSIS — N185 Chronic kidney disease, stage 5: Secondary | ICD-10-CM | POA: Diagnosis not present

## 2022-02-13 DIAGNOSIS — G4730 Sleep apnea, unspecified: Secondary | ICD-10-CM | POA: Insufficient documentation

## 2022-02-13 HISTORY — PX: CAPD REMOVAL: SHX5234

## 2022-02-13 LAB — POCT I-STAT, CHEM 8
BUN: 22 mg/dL — ABNORMAL HIGH (ref 6–20)
Calcium, Ion: 1 mmol/L — ABNORMAL LOW (ref 1.15–1.40)
Chloride: 99 mmol/L (ref 98–111)
Creatinine, Ser: 9.3 mg/dL — ABNORMAL HIGH (ref 0.61–1.24)
Glucose, Bld: 97 mg/dL (ref 70–99)
HCT: 31 % — ABNORMAL LOW (ref 39.0–52.0)
Hemoglobin: 10.5 g/dL — ABNORMAL LOW (ref 13.0–17.0)
Potassium: 4.2 mmol/L (ref 3.5–5.1)
Sodium: 136 mmol/L (ref 135–145)
TCO2: 26 mmol/L (ref 22–32)

## 2022-02-13 SURGERY — LAPAROSCOPIC REMOVAL CONTINUOUS AMBULATORY PERITONEAL DIALYSIS  (CAPD) CATHETER
Anesthesia: General | Site: Abdomen

## 2022-02-13 MED ORDER — ROCURONIUM BROMIDE 10 MG/ML (PF) SYRINGE
PREFILLED_SYRINGE | INTRAVENOUS | Status: AC
  Filled 2022-02-13: qty 10

## 2022-02-13 MED ORDER — CEFAZOLIN SODIUM-DEXTROSE 2-4 GM/100ML-% IV SOLN
2.0000 g | INTRAVENOUS | Status: AC
  Administered 2022-02-13: 2 g via INTRAVENOUS

## 2022-02-13 MED ORDER — PHENYLEPHRINE 80 MCG/ML (10ML) SYRINGE FOR IV PUSH (FOR BLOOD PRESSURE SUPPORT)
PREFILLED_SYRINGE | INTRAVENOUS | Status: AC
  Filled 2022-02-13: qty 10

## 2022-02-13 MED ORDER — PHENYLEPHRINE 80 MCG/ML (10ML) SYRINGE FOR IV PUSH (FOR BLOOD PRESSURE SUPPORT)
PREFILLED_SYRINGE | INTRAVENOUS | Status: DC | PRN
  Administered 2022-02-13: 160 ug via INTRAVENOUS
  Administered 2022-02-13 (×2): 80 ug via INTRAVENOUS

## 2022-02-13 MED ORDER — CEFAZOLIN SODIUM-DEXTROSE 2-4 GM/100ML-% IV SOLN
INTRAVENOUS | Status: AC
  Filled 2022-02-13: qty 100

## 2022-02-13 MED ORDER — OXYCODONE HCL 5 MG PO TABS
5.0000 mg | ORAL_TABLET | Freq: Once | ORAL | Status: AC
  Administered 2022-02-13: 5 mg via ORAL

## 2022-02-13 MED ORDER — PROPOFOL 10 MG/ML IV BOLUS
INTRAVENOUS | Status: AC
  Filled 2022-02-13: qty 20

## 2022-02-13 MED ORDER — LIDOCAINE 2% (20 MG/ML) 5 ML SYRINGE
INTRAMUSCULAR | Status: AC
  Filled 2022-02-13: qty 5

## 2022-02-13 MED ORDER — MIDAZOLAM HCL 2 MG/2ML IJ SOLN
INTRAMUSCULAR | Status: AC
  Filled 2022-02-13: qty 2

## 2022-02-13 MED ORDER — ACETAMINOPHEN 500 MG PO TABS
ORAL_TABLET | ORAL | Status: AC
  Administered 2022-02-13: 1000 mg via ORAL
  Filled 2022-02-13: qty 1

## 2022-02-13 MED ORDER — LIDOCAINE 2% (20 MG/ML) 5 ML SYRINGE
INTRAMUSCULAR | Status: DC | PRN
  Administered 2022-02-13: 60 mg via INTRAVENOUS

## 2022-02-13 MED ORDER — SUGAMMADEX SODIUM 200 MG/2ML IV SOLN
INTRAVENOUS | Status: DC | PRN
  Administered 2022-02-13: 200 mg via INTRAVENOUS

## 2022-02-13 MED ORDER — LACTATED RINGERS IV SOLN: INTRAVENOUS | Status: DC

## 2022-02-13 MED ORDER — HEPARIN SODIUM (PORCINE) 1000 UNIT/ML IJ SOLN
INTRAMUSCULAR | Status: AC
  Filled 2022-02-13: qty 10

## 2022-02-13 MED ORDER — PHENYLEPHRINE HCL-NACL 20-0.9 MG/250ML-% IV SOLN
INTRAVENOUS | Status: DC | PRN
  Administered 2022-02-13: 50 ug/min via INTRAVENOUS

## 2022-02-13 MED ORDER — AMISULPRIDE (ANTIEMETIC) 5 MG/2ML IV SOLN: 10.0000 mg | Freq: Once | INTRAVENOUS | Status: DC | PRN

## 2022-02-13 MED ORDER — ROCURONIUM BROMIDE 10 MG/ML (PF) SYRINGE
PREFILLED_SYRINGE | INTRAVENOUS | Status: DC | PRN
  Administered 2022-02-13: 50 mg via INTRAVENOUS

## 2022-02-13 MED ORDER — CHLORHEXIDINE GLUCONATE 0.12 % MT SOLN
OROMUCOSAL | Status: AC
  Administered 2022-02-13: 15 mL via OROMUCOSAL
  Filled 2022-02-13: qty 15

## 2022-02-13 MED ORDER — DEXAMETHASONE SODIUM PHOSPHATE 10 MG/ML IJ SOLN
INTRAMUSCULAR | Status: AC
  Filled 2022-02-13: qty 1

## 2022-02-13 MED ORDER — ONDANSETRON HCL 4 MG/2ML IJ SOLN
INTRAMUSCULAR | Status: AC
  Filled 2022-02-13: qty 2

## 2022-02-13 MED ORDER — MIDAZOLAM HCL 2 MG/2ML IJ SOLN
INTRAMUSCULAR | Status: DC | PRN
  Administered 2022-02-13: 2 mg via INTRAVENOUS

## 2022-02-13 MED ORDER — CHLORHEXIDINE GLUCONATE 4 % EX LIQD: 60.0000 mL | Freq: Once | CUTANEOUS | Status: DC

## 2022-02-13 MED ORDER — DEXAMETHASONE SODIUM PHOSPHATE 10 MG/ML IJ SOLN
INTRAMUSCULAR | Status: DC | PRN
  Administered 2022-02-13: 5 mg via INTRAVENOUS

## 2022-02-13 MED ORDER — ONDANSETRON HCL 4 MG/2ML IJ SOLN
INTRAMUSCULAR | Status: DC | PRN
  Administered 2022-02-13: 4 mg via INTRAVENOUS

## 2022-02-13 MED ORDER — CHLORHEXIDINE GLUCONATE 0.12 % MT SOLN: 15.0000 mL | Freq: Once | OROMUCOSAL | Status: AC

## 2022-02-13 MED ORDER — FENTANYL CITRATE (PF) 250 MCG/5ML IJ SOLN
INTRAMUSCULAR | Status: AC
  Filled 2022-02-13: qty 5

## 2022-02-13 MED ORDER — OXYCODONE HCL 5 MG PO TABS
ORAL_TABLET | ORAL | Status: AC
  Filled 2022-02-13: qty 1

## 2022-02-13 MED ORDER — PROPOFOL 10 MG/ML IV BOLUS
INTRAVENOUS | Status: DC | PRN
  Administered 2022-02-13: 130 mg via INTRAVENOUS

## 2022-02-13 MED ORDER — FENTANYL CITRATE (PF) 250 MCG/5ML IJ SOLN
INTRAMUSCULAR | Status: DC | PRN
  Administered 2022-02-13: 50 ug via INTRAVENOUS
  Administered 2022-02-13: 100 ug via INTRAVENOUS

## 2022-02-13 MED ORDER — HYDROMORPHONE HCL 1 MG/ML IJ SOLN: 0.2500 mg | INTRAMUSCULAR | Status: DC | PRN

## 2022-02-13 MED ORDER — ORAL CARE MOUTH RINSE: 15.0000 mL | Freq: Once | OROMUCOSAL | Status: AC

## 2022-02-13 MED ORDER — ACETAMINOPHEN 500 MG PO TABS: 1000.0000 mg | ORAL_TABLET | Freq: Once | ORAL | Status: AC

## 2022-02-13 MED ORDER — SODIUM CHLORIDE 0.9 % IV SOLN
INTRAVENOUS | Status: DC
Start: 2022-02-13 — End: 2022-02-15
  Administered 2022-02-13: 10 mL/h via INTRAVENOUS

## 2022-02-13 MED ORDER — OXYCODONE-ACETAMINOPHEN 5-325 MG PO TABS
1.0000 | ORAL_TABLET | Freq: Four times a day (QID) | ORAL | 0 refills | Status: DC | PRN
Start: 2022-02-13 — End: 2022-03-11
  Filled 2022-02-13: qty 12, 3d supply, fill #0

## 2022-02-13 SURGICAL SUPPLY — 25 items
BLADE 11 SAFETY STRL DISP (BLADE) ×2 IMPLANT
CHLORAPREP W/TINT 26 (MISCELLANEOUS) ×2 IMPLANT
COVER SURGICAL LIGHT HANDLE (MISCELLANEOUS) ×2 IMPLANT
DERMABOND ADVANCED (GAUZE/BANDAGES/DRESSINGS) ×1
DERMABOND ADVANCED .7 DNX12 (GAUZE/BANDAGES/DRESSINGS) ×1 IMPLANT
DRAPE LAPAROSCOPIC ABDOMINAL (DRAPES) ×1 IMPLANT
ELECT REM PT RETURN 9FT ADLT (ELECTROSURGICAL) ×2
ELECTRODE REM PT RTRN 9FT ADLT (ELECTROSURGICAL) ×1 IMPLANT
GLOVE SURG SS PI 8.0 STRL IVOR (GLOVE) ×2 IMPLANT
GOWN STRL REUS W/ TWL LRG LVL3 (GOWN DISPOSABLE) ×3 IMPLANT
GOWN STRL REUS W/ TWL XL LVL3 (GOWN DISPOSABLE) ×1 IMPLANT
GOWN STRL REUS W/TWL LRG LVL3 (GOWN DISPOSABLE) ×3
GOWN STRL REUS W/TWL XL LVL3 (GOWN DISPOSABLE) ×1
KIT BASIN OR (CUSTOM PROCEDURE TRAY) ×2 IMPLANT
KIT TURNOVER KIT B (KITS) ×2 IMPLANT
NS IRRIG 1000ML POUR BTL (IV SOLUTION) ×2 IMPLANT
PACK GENERAL/GYN (CUSTOM PROCEDURE TRAY) ×1 IMPLANT
PAD ARMBOARD 7.5X6 YLW CONV (MISCELLANEOUS) ×4 IMPLANT
SUT MNCRL AB 4-0 PS2 18 (SUTURE) ×4 IMPLANT
SUT PDS AB 3-0 SH 27 (SUTURE) ×1 IMPLANT
SUT VIC AB 3-0 SH 27 (SUTURE)
SUT VIC AB 3-0 SH 27X BRD (SUTURE) IMPLANT
SYR 20CC LL (SYRINGE) ×2 IMPLANT
TOWEL GREEN STERILE (TOWEL DISPOSABLE) ×2 IMPLANT
TOWEL GREEN STERILE FF (TOWEL DISPOSABLE) ×2 IMPLANT

## 2022-02-13 NOTE — Transfer of Care (Signed)
Immediate Anesthesia Transfer of Care Note ? ?Patient: Albert Eaton ? ?Procedure(s) Performed: PERITONEAL DIALYSIS CATHETER REMOVAL (Abdomen) ? ?Patient Location: PACU ? ?Anesthesia Type:General ? ?Level of Consciousness: awake, alert  and oriented ? ?Airway & Oxygen Therapy: Patient Spontanous Breathing and Patient connected to face mask oxygen ? ?Post-op Assessment: Report given to RN and Post -op Vital signs reviewed and stable ? ?Post vital signs: Reviewed and stable ? ?Last Vitals:  ?Vitals Value Taken Time  ?BP 94/74 02/13/22 1208  ?Temp    ?Pulse 65 02/13/22 1210  ?Resp 8 02/13/22 1210  ?SpO2 88 % 02/13/22 1210  ?Vitals shown include unvalidated device data. ? ?Last Pain:  ?Vitals:  ? 02/13/22 0819  ?TempSrc:   ?PainSc: 0-No pain  ?   ? ?  ? ?Complications: No notable events documented. ?

## 2022-02-13 NOTE — Interval H&P Note (Signed)
History and Physical Interval Note: ? ?02/13/2022 ?9:45 AM ? ?[name]  has presented today for surgery, with the diagnosis of ESRD.  The various methods of treatment have been discussed with the patient and family. After consideration of risks, benefits and other options for treatment, the patient has consented to  Procedure(s): ?PERITONEAL DIALYSIS CATHETER REMOVAL (N/A) as a surgical intervention.  The patient's history has been reviewed, patient examined, no change in status, stable for surgery.  I have reviewed the patient's chart and labs.  Questions were answered to the patient's satisfaction.   ? ? ?Albert Eaton ? ? ?

## 2022-02-13 NOTE — Op Note (Signed)
DATE OF SERVICE: 02/13/2022 ? ?PATIENT:  Albert Eaton  55 y.o. male ? ?PRE-OPERATIVE DIAGNOSIS:  peritoneal dialysis catheter not functional ? ?POST-OPERATIVE DIAGNOSIS:  Same ? ?PROCEDURE:   ?Removal of peritoneal dialysis catheter ? ?SURGEON:  Surgeon(s) and Role: ?   * [name], MD - Primary ? ?ASSISTANT: none ? ?ANESTHESIA:   general ? ?EBL: minimal ? ?BLOOD ADMINISTERED:none ? ?DRAINS: none  ? ?LOCAL MEDICATIONS USED:  NONE ? ?SPECIMEN:  none ? ?COUNTS: confirmed correct. ? ?TOURNIQUET:  none ? ?PATIENT DISPOSITION:  PACU - hemodynamically stable. ?  ?Delay start of Pharmacological VTE agent (>24hrs) due to surgical blood loss or risk of bleeding: no ? ?INDICATION FOR PROCEDURE: [name] is a 56 y.o. male with non-functioning peritoneal dialysis catheter. After careful discussion of risks, benefits, and alternatives the patient was offered PD catheter removal. The patient understood and wished to proceed. ? ?OPERATIVE FINDINGS: unremarkable removal of peritoneal dialysis catheter. All catheter pieces retrieved. ? ?DESCRIPTION OF PROCEDURE: After identification of the patient in the pre-operative holding area, the patient was transferred to the operating room. The patient was positioned supine on the operating room table. Anesthesia was induced. The abdomen was prepped and draped in standard fashion. A surgical pause was performed confirming correct patient, procedure, and operative location. ? ?Using intraoperative ultrasound the course of the peritoneal dialysis catheter was marked on the skin.  The cuffs were identified and marked on the skin.  2 transverse incisions were made in the abdomen.  One was over the "swan-neck" portion of the catheter.  The other was over the portion of the catheter as it entered the rectus fascia.  The 3 cuffs were completely skeletonized.  And the catheter was freed from the surrounding subcutaneous tissue.  I disconnected the catheter at the Christmas tree  adapter and remove the 2 pieces of catheter intact.  The fascial defect was closed with 3-0 PDS suture.  The skin was closed with 3-0 Vicryl and 4-0 Monocryl.  Dermabond was applied. ? ?Upon completion of the case instrument and sharps counts were confirmed correct. The patient was transferred to the PACU in good condition. I was present for all portions of the procedure. ? ?[name]. Stanford Breed, MD ?[facility] ?Office Phone Number: [phone] ?02/13/2022 2:09 PM ? ? ? ?

## 2022-02-13 NOTE — Interval H&P Note (Signed)
History and Physical Interval Note: ? ?02/13/2022 ?9:30 AM ? ?Albert Eaton  has presented today for surgery, with the diagnosis of ESRD.  The various methods of treatment have been discussed with the patient and family. After consideration of risks, benefits and other options for treatment, the patient has consented to  Procedure(s): ?PERITONEAL DIALYSIS CATHETER REMOVAL (N/A) as a surgical intervention.  The patient's history has been reviewed, patient examined, no change in status, stable for surgery.  I have reviewed the patient's chart and labs.  Questions were answered to the patient's satisfaction.   ? ? ?[name] ? ? ?

## 2022-02-13 NOTE — Anesthesia Procedure Notes (Signed)
Procedure Name: Intubation ?Date/Time: 02/13/2022 11:19 AM ?Performed by: Reece Agar, CRNA ?Pre-anesthesia Checklist: Patient identified, Emergency Drugs available, Suction available and Patient being monitored ?Patient Re-evaluated:Patient Re-evaluated prior to induction ?Oxygen Delivery Method: Circle System Utilized ?Preoxygenation: Pre-oxygenation with 100% oxygen ?Induction Type: IV induction ?Ventilation: Mask ventilation without difficulty and Oral airway inserted - appropriate to patient size ?Laryngoscope Size: Mac and 4 ?Grade View: Grade II ?Tube type: Oral ?Tube size: 7.5 mm ?Number of attempts: 1 ?Airway Equipment and Method: Stylet and Oral airway ?Placement Confirmation: ETT inserted through vocal cords under direct vision, positive ETCO2 and breath sounds checked- equal and bilateral ?Secured at: 23 cm ?Tube secured with: Tape ?Dental Injury: Teeth and Oropharynx as per pre-operative assessment  ?Comments: Paramedic Student DL with MAC 4 Grade 3 view, CRNA DL with MAC 4 grade 2a view, successful intubation ? ? ? ? ?

## 2022-02-14 ENCOUNTER — Encounter (HOSPITAL_COMMUNITY): Payer: Self-pay | Admitting: Vascular Surgery

## 2022-02-14 DIAGNOSIS — E876 Hypokalemia: Secondary | ICD-10-CM | POA: Diagnosis not present

## 2022-02-14 DIAGNOSIS — D509 Iron deficiency anemia, unspecified: Secondary | ICD-10-CM | POA: Diagnosis not present

## 2022-02-14 DIAGNOSIS — N186 End stage renal disease: Secondary | ICD-10-CM | POA: Diagnosis not present

## 2022-02-14 DIAGNOSIS — D631 Anemia in chronic kidney disease: Secondary | ICD-10-CM | POA: Diagnosis not present

## 2022-02-14 DIAGNOSIS — Z992 Dependence on renal dialysis: Secondary | ICD-10-CM | POA: Diagnosis not present

## 2022-02-14 DIAGNOSIS — N2581 Secondary hyperparathyroidism of renal origin: Secondary | ICD-10-CM | POA: Diagnosis not present

## 2022-02-14 NOTE — Anesthesia Postprocedure Evaluation (Signed)
Anesthesia Post Note ? ?Patient: Albert Eaton ? ?Procedure(s) Performed: PERITONEAL DIALYSIS CATHETER REMOVAL (Abdomen) ? ?  ? ?Patient location during evaluation: PACU ?Anesthesia Type: General ?Level of consciousness: sedated and patient cooperative ?Pain management: pain level controlled ?Vital Signs Assessment: post-procedure vital signs reviewed and stable ?Respiratory status: spontaneous breathing ?Cardiovascular status: stable ?Anesthetic complications: no ? ? ?No notable events documented. ? ?Last Vitals:  ?Vitals:  ? 02/13/22 1255 02/13/22 1310  ?BP: 100/73 112/86  ?Pulse: 71 73  ?Resp: 16 15  ?Temp:  36.7 ?C  ?SpO2: 93% 95%  ?  ?Last Pain:  ?Vitals:  ? 02/13/22 1240  ?TempSrc:   ?PainSc: 0-No pain  ? ? ?  ?  ?  ?  ?  ?  ? ?Nolon Nations ? ? ? ? ?

## 2022-02-17 ENCOUNTER — Telehealth: Payer: Self-pay | Admitting: Cardiovascular Disease

## 2022-02-17 DIAGNOSIS — D509 Iron deficiency anemia, unspecified: Secondary | ICD-10-CM | POA: Diagnosis not present

## 2022-02-17 DIAGNOSIS — Z992 Dependence on renal dialysis: Secondary | ICD-10-CM | POA: Diagnosis not present

## 2022-02-17 DIAGNOSIS — N186 End stage renal disease: Secondary | ICD-10-CM | POA: Diagnosis not present

## 2022-02-17 DIAGNOSIS — R0602 Shortness of breath: Secondary | ICD-10-CM

## 2022-02-17 DIAGNOSIS — E876 Hypokalemia: Secondary | ICD-10-CM | POA: Diagnosis not present

## 2022-02-17 DIAGNOSIS — N2581 Secondary hyperparathyroidism of renal origin: Secondary | ICD-10-CM | POA: Diagnosis not present

## 2022-02-17 DIAGNOSIS — I5032 Chronic diastolic (congestive) heart failure: Secondary | ICD-10-CM

## 2022-02-17 DIAGNOSIS — D631 Anemia in chronic kidney disease: Secondary | ICD-10-CM | POA: Diagnosis not present

## 2022-02-17 NOTE — Telephone Encounter (Signed)
Pt states that he hasn't missed any dialysis treatments, but is more SOB than baseline. Dry weight is 88kg and at dialysis today was 87.6kg. Per pt, they believed he was retaining fluid, so attempted to pull off a little more fluid than his usual. Pt states that his symptoms are better after dialysis today, but does still have some SOB. Pt condones a persistent cough, denies getting mucus up, but states he feels like he has post-nasal drip, and upon taking deep breaths, he feels the urge to cough. Will route to [name], MD for advisement. Informed pt that the symptoms are vague and may be attributed to seasonal allergies, or truly extra fluid accumulation, that dialysis would remedy. Advised that he reach out to his PCP if he needs to while we wait for response from cardiologist. ?

## 2022-02-17 NOTE — Telephone Encounter (Signed)
Pt c/o Shortness Of Breath: STAT if SOB developed within the last 24 hours or pt is noticeably SOB on the phone ? ?1. Are you currently SOB (can you hear that pt is SOB on the phone)? Patient states that he feels SOB right now. I did not notice over the phone.  ? ?2. How long have you been experiencing SOB? Last 3 weeks he has been having issues  ? ?3. Are you SOB when sitting or when up moving around? Mostly when moving around especially when bending over.  ? ?4. Are you currently experiencing any other symptoms? Patient States that when he was up moving around yesterday he had some gurgling his chest, denies chest pain.  States that he is having a lot of fatigue and does have a dry cough.  ?

## 2022-02-18 NOTE — Telephone Encounter (Signed)
Patient recently seen in the office.  Would be reasonable to check a chest x-ray, EKG, basic metabolic panel, and BNP.  I am happy to look at these results but do not have availability to see him since I am on hospital service.  Could add him on as a DOD add on or to one of the APP clinics if availability. ?

## 2022-02-19 DIAGNOSIS — D509 Iron deficiency anemia, unspecified: Secondary | ICD-10-CM | POA: Diagnosis not present

## 2022-02-19 DIAGNOSIS — N2581 Secondary hyperparathyroidism of renal origin: Secondary | ICD-10-CM | POA: Diagnosis not present

## 2022-02-19 DIAGNOSIS — N186 End stage renal disease: Secondary | ICD-10-CM | POA: Diagnosis not present

## 2022-02-19 DIAGNOSIS — D631 Anemia in chronic kidney disease: Secondary | ICD-10-CM | POA: Diagnosis not present

## 2022-02-19 DIAGNOSIS — Z992 Dependence on renal dialysis: Secondary | ICD-10-CM | POA: Diagnosis not present

## 2022-02-19 DIAGNOSIS — E876 Hypokalemia: Secondary | ICD-10-CM | POA: Diagnosis not present

## 2022-02-19 NOTE — Telephone Encounter (Signed)
Pt scheduled to see Dr Irish Lack as DOD in office tomorrow 02/19/22. Called and spoke to patient who recently had CXR on 02/09/22 while in the ED which showed: ? ?FINDINGS: ?Frontal and lateral views of the chest demonstrate stable ?enlargement of the cardiac silhouette. There is increased central ?vascular congestion, with patchy bibasilar ground-glass airspace ?disease and interstitial prominence consistent with edema. Trace ?bilateral effusions, left greater than right. No pneumothorax. ?Peritoneal dialysis catheter within the left anterior abdominal ?wall. ?  ?IMPRESSION: ?1. Findings most consistent with mild congestive heart failure. ?  ?Orders placed at this time for BMET, BNP and EKG to be done tomorrow at visit. ?

## 2022-02-20 ENCOUNTER — Ambulatory Visit (INDEPENDENT_AMBULATORY_CARE_PROVIDER_SITE_OTHER): Payer: Medicare Other | Admitting: Interventional Cardiology

## 2022-02-20 ENCOUNTER — Other Ambulatory Visit: Payer: Self-pay | Admitting: *Deleted

## 2022-02-20 ENCOUNTER — Encounter: Payer: Self-pay | Admitting: Interventional Cardiology

## 2022-02-20 VITALS — BP 120/76 | HR 96 | Ht 70.0 in | Wt 202.8 lb

## 2022-02-20 DIAGNOSIS — I340 Nonrheumatic mitral (valve) insufficiency: Secondary | ICD-10-CM

## 2022-02-20 DIAGNOSIS — I10 Essential (primary) hypertension: Secondary | ICD-10-CM | POA: Diagnosis not present

## 2022-02-20 DIAGNOSIS — R0602 Shortness of breath: Secondary | ICD-10-CM

## 2022-02-20 DIAGNOSIS — I5032 Chronic diastolic (congestive) heart failure: Secondary | ICD-10-CM | POA: Diagnosis not present

## 2022-02-20 DIAGNOSIS — N186 End stage renal disease: Secondary | ICD-10-CM

## 2022-02-20 NOTE — Progress Notes (Signed)
?  ?Cardiology Office Note ? ? ?Date:  02/20/2022  ? ?ID:  Albert Eaton, DOB 1966/01/12, MRN [id number] ? ?PCP:  [name], NP  ? ? ?No chief complaint on file. ? ? ? ?Wt Readings from Last 3 Encounters:  ?02/20/22 202 lb 12.8 oz (92 kg)  ?02/13/22 200 lb (90.7 kg)  ?01/23/22 207 lb (93.9 kg)  ?  ? ?  ?History of Present Illness: ?Albert Eaton is a 56 y.o. male  with a hx of: ?HFpEF) heart failure with preserved ejection fraction  ?Mitral regurgitation  ?Echocardiogram 5/22: EF 60-65, mod to severe MR ?Eval by Dr. [name] 2/22>>med management  ?Coronary artery disease  ?Non-obstructive by Cor CTA in 09/2020  ?ESRD on hemodialysis ?Renal Cell CA s/p L nephrectomy 01/2021 ([place]) ?Hypertension  ?Hyperlipidemia  ?OSA  ?HIV+ ?Atrial Tachycardia ?Eval with EP (Dr. Lurline Hare) in 3/22 >> Conservative mgmt ?Amio Rx if symptomatic or more significant  ? ?Last saw Dr. [name] in February 2023.  Plan was for repeat echocardiogram in August 2023 to reassess his moderate to severe mitral regurgitation.  He was being transitioned from peritoneal diet assist to hemodialysis. ? ?About three weeks ago, he had worsening shortness of breath associated with runny nose.  He thought it was seasonal allergies.  He ultimately went to the ER due to orthopnea.  He did not stay for MD eval. ? ?CXR showed: ?"There is increased central ?vascular congestion, with patchy bibasilar ground-glass airspace ?disease and interstitial prominence consistent with edema. Trace ?bilateral effusions, left greater than right. No pneumothorax." ? ?He has some DOE.  ? ?Past Medical History:  ?Diagnosis Date  ? Anemia   ? Aortic atherosclerosis ([place])   ? Arthritis   ? Asthma   ? Cancer [facility])   ? renal cyst  ? Chronic diastolic CHF (congestive heart failure) ([place]) 01/06/2017  ? Echo 7/16 [facility] in [place], [name]) Mild AI, mild LAE, mild concentric LVH, EF 55, normal wall motion, mild to moderate MR, mild PI, RVSP 55 // Echo 10/09/16 ([facility]):   Moderate LVH, grade 2 diastolic dysfunction, mild MR, moderate LAE   ? CKD (chronic kidney disease)   ? Dialysis Mon Wed Fri  ? Esophagitis   ? GERD (gastroesophageal reflux disease)   ? Gout   ? no current problems  ? Heart murmur   ? never caused any problems  ? Hepatitis   ? Hep B  ? History of nuclear stress test   ? a. Nuc study 7/16: no scar or ischemia, EF 42 // b. Nuc study 12/17: EF 48, ?small apical ischemia, Low Risk  ? HIV (human immunodeficiency virus infection) ([place])   ? HLD (hyperlipidemia)   ? Hypertension   ? Hypertensive heart disease with CHF (congestive heart failure) ([place]) 10/09/2016  ? Hypothyroidism   ? Mitral regurgitation   ? OSA (obstructive sleep apnea)   ? Post-operative nausea and vomiting   ? 1 time as a child, no problems as an adult  ? Sleep apnea   ? uses c-pap  ? Thyroid disease   ? Wears glasses   ? Wears partial dentures   ? ? ?Past Surgical History:  ?Procedure Laterality Date  ? AV FISTULA PLACEMENT Left 07/02/2018  ? Procedure: Creation of Left arm BRACHIOBASILIC ARTERIOVENOUS  FISTULA;  Surgeon: Marty Heck, MD;  Location: [place];  Service: Vascular;  Laterality: Left;  ? BASCILIC VEIN TRANSPOSITION Left 08/27/2018  ? Procedure: Left arm BRACHIOBASILIC VEIN TRANSPOSITION SECOND  STAGE;  Surgeon: Marty Heck, MD;  Location: [place];  Service: Vascular;  Laterality: Left;  ? BUBBLE STUDY  09/03/2020  ? Procedure: BUBBLE STUDY;  Surgeon: Fay Records, MD;  Location: [place];  Service: Cardiovascular;;  ? CAPD INSERTION N/A 05/30/2021  ? Procedure: LAPAROSCOPIC INSERTION CONTINUOUS AMBULATORY PERITONEAL DIALYSIS  (CAPD) CATHETER;  Surgeon: [name], MD;  Location: [place];  Service: Vascular;  Laterality: N/A;  ? CAPD REMOVAL N/A 02/13/2022  ? Procedure: PERITONEAL DIALYSIS CATHETER REMOVAL;  Surgeon: [name], MD;  Location: [place];  Service: Vascular;  Laterality: N/A;  ? CHOLECYSTECTOMY    ? COLONOSCOPY W/ BIOPSIES AND POLYPECTOMY    ? GIVENS  CAPSULE STUDY N/A 03/01/2018  ? Procedure: GIVENS CAPSULE STUDY;  Surgeon: Jerene Bears, MD;  Location: [place];  Service: Gastroenterology;  Laterality: N/A;  ? HERNIA REPAIR    ? As baby  ? MULTIPLE TOOTH EXTRACTIONS    ? NEPHRECTOMY Left 02/18/2021  ? NOSE SURGERY    ? RENAL BIOPSY    ? RIGHT/LEFT HEART CATH AND CORONARY ANGIOGRAPHY N/A 09/04/2021  ? Procedure: RIGHT/LEFT HEART CATH AND CORONARY ANGIOGRAPHY;  Surgeon: Burnell Blanks, MD;  Location: [place] [facility];  Service: Cardiovascular;  Laterality: N/A;  ? TEE WITHOUT CARDIOVERSION N/A 09/03/2020  ? Procedure: TRANSESOPHAGEAL ECHOCARDIOGRAM (TEE);  Surgeon: Fay Records, MD;  Location: [place];  Service: Cardiovascular;  Laterality: N/A;  ? UPPER GI ENDOSCOPY  04/05/2021  ? ? ? ?Current Outpatient Medications  ?Medication Sig Dispense Refill  ? ACETAMINOPHEN EXTRA STRENGTH 500 MG tablet Take 1,000 mg by mouth every 6 (six) hours as needed (pain.).    ? albuterol (PROAIR HFA) 108 (90 Base) MCG/ACT inhaler INHALE 2 PUFFS INTO THE LUNGS EVERY 4 HOURS AS NEEDED FOR WHEEZING OR SHORTNESS OF BREATH 17 g 1  ? allopurinol (ZYLOPRIM) 100 MG tablet TAKE 1 TABLET(100 MG) BY MOUTH TWICE DAILY FOR 60 DOSES 60 tablet 0  ? amLODipine (NORVASC) 10 MG tablet Take 1 tablet (10 mg total) by mouth daily. 30 tablet 1  ? apixaban (ELIQUIS) 5 MG TABS tablet Take 1 tablet (5 mg total) by mouth 2 (two) times daily. 60 tablet 0  ? aspirin EC 81 MG tablet Take 1 tablet (81 mg total) by mouth daily. Swallow whole. 90 tablet 3  ? azelastine (ASTELIN) 0.1 % nasal spray Place 2 sprays into both nostrils 2 (two) times daily as needed for allergies.    ? BIKTARVY 50-200-25 MG TABS tablet Take 1 tablet by mouth daily. 30 tablet 5  ? chlorproMAZINE (THORAZINE) 10 MG tablet Take 1 tablet (10 mg total) by mouth 3 (three) times daily as needed for hiccoughs. 60 tablet 0  ? cinacalcet (SENSIPAR) 30 MG tablet Take 60 mg by mouth every evening.    ? famotidine (PEPCID) 20 MG  tablet Take 1 tablet (20 mg total) by mouth 2 (two) times daily. 180 tablet 3  ? ferric citrate (AURYXIA) 1 GM 210 MG(Fe) tablet Take 420 mg by mouth 2 (two) times daily with a meal.    ? fluticasone (FLOVENT HFA) 110 MCG/ACT inhaler INHALE 2 PUFFS INTO THE LUNGS TWICE DAILY (Patient taking differently: Inhale 2 puffs into the lungs every morning.) 12 g 4  ? gabapentin (NEURONTIN) 300 MG capsule TAKE 1 CAPSULE(300 MG) BY MOUTH THREE TIMES DAILY 90 capsule 3  ? hydrALAZINE (APRESOLINE) 100 MG tablet Take 100 mg by mouth 3 (three) times daily.    ?  hyoscyamine (LEVSIN SL) 0.125 MG SL tablet Place 1-2 tablets (0.125-0.25 mg total) under the tongue every 8 (eight) hours as needed (lower abdominal pain, spasm). 90 tablet 2  ? ipratropium (ATROVENT) 0.06 % nasal spray USE 2 SPRAYS IN EACH NOSTRIL 2 TO 3 TIMES DAILY AS NEEDED 15 mL 1  ? lactulose (CHRONULAC) 10 GM/15ML solution Take 20 g by mouth daily as needed for moderate constipation.     ? levothyroxine (SYNTHROID) 25 MCG tablet TAKE 1 TABLET BY MOUTH DAILY BEFORE BREAKFAST 90 tablet 3  ? lubiprostone (AMITIZA) 24 MCG capsule TAKE 1 CAPSULE(24 MCG) BY MOUTH TWICE DAILY WITH A MEAL 60 capsule 4  ? Methoxy PEG-Epoetin Beta (MIRCERA IJ) Inject 1 each into the skin every 30 (thirty) days. Administered at dialysis center once a month    ? metoprolol tartrate (LOPRESSOR) 25 MG tablet Take 0.5 tablets (12.5 mg total) by mouth 2 (two) times daily. 90 tablet 3  ? multivitamin (RENA-VIT) TABS tablet Take 1 tablet by mouth at bedtime.    ? OLOPATADINE HCL OP Place 1 drop into both eyes daily as needed (allergies).    ? oxyCODONE-acetaminophen (PERCOCET) 5-325 MG tablet Take 1 tablet by mouth every 6 (six) hours as needed for severe pain. 12 tablet 0  ? pantoprazole (PROTONIX) 40 MG tablet TAKE 1 TABLET(40 MG) BY MOUTH DAILY 30 tablet 6  ? rOPINIRole (REQUIP) 0.5 MG tablet Take 1 mg by mouth daily.    ? rOPINIRole (REQUIP) 1 MG tablet TAKE ONE TABLET BY MOUTH AT BEDTIME 90 tablet  2  ? rosuvastatin (CRESTOR) 10 MG tablet TAKE ONE TABLET BY MOUTH DAILY 90 tablet 3  ? Spacer/Aero-Holding Chambers DEVI 1 Device by Does not apply route 2 (two) times a day. 1 each 1  ? Vitamin D, Ergocalc

## 2022-02-20 NOTE — Patient Instructions (Signed)
Medication Instructions:  ?Your physician recommends that you continue on your current medications as directed. Please refer to the Current Medication list given to you today. ? ?*If you need a refill on your cardiac medications before your next appointment, please call your pharmacy* ? ? ?Lab Work: ?Lab work done today ?If you have labs (blood work) drawn today and your tests are completely normal, you will receive your results only by: ?MyChart Message (if you have MyChart) OR ?A paper copy in the mail ?If you have any lab test that is abnormal or we need to change your treatment, we will call you to review the results. ? ? ?Testing/Procedures: ?none ? ? ?Follow-Up: ?At [facility], you and your health needs are our priority.  As part of our continuing mission to provide you with exceptional heart care, we have created designated Provider Care Teams.  These Care Teams include your primary Cardiologist (physician) and Advanced Practice Providers (APPs -  Physician Assistants and Nurse Practitioners) who all work together to provide you with the care you need, when you need it. ? ?We recommend signing up for the patient portal called "MyChart".  Sign up information is provided on this After Visit Summary.  MyChart is used to connect with patients for Virtual Visits (Telemedicine).  Patients are able to view lab/test results, encounter notes, upcoming appointments, etc.  Non-urgent messages can be sent to your provider as well.   ?To learn more about what you can do with MyChart, go to [email or web address].   ? ?Your next appointment:   ?August 2 for Echo and appointment with Dr [name] ? ?The format for your next appointment:   ?In Person ? ?Provider:   ?[name], MD   ? ? ?Other Instructions ?  ? ?Important Information About Sugar ? ? ? ? ? ? ?

## 2022-02-21 DIAGNOSIS — D631 Anemia in chronic kidney disease: Secondary | ICD-10-CM | POA: Diagnosis not present

## 2022-02-21 DIAGNOSIS — E876 Hypokalemia: Secondary | ICD-10-CM | POA: Diagnosis not present

## 2022-02-21 DIAGNOSIS — Z992 Dependence on renal dialysis: Secondary | ICD-10-CM | POA: Diagnosis not present

## 2022-02-21 DIAGNOSIS — D509 Iron deficiency anemia, unspecified: Secondary | ICD-10-CM | POA: Diagnosis not present

## 2022-02-21 DIAGNOSIS — N2581 Secondary hyperparathyroidism of renal origin: Secondary | ICD-10-CM | POA: Diagnosis not present

## 2022-02-21 DIAGNOSIS — N186 End stage renal disease: Secondary | ICD-10-CM | POA: Diagnosis not present

## 2022-02-21 LAB — BASIC METABOLIC PANEL
BUN/Creatinine Ratio: 3 — ABNORMAL LOW (ref 9–20)
BUN: 26 mg/dL — ABNORMAL HIGH (ref 6–24)
CO2: 22 mmol/L (ref 20–29)
Calcium: 8.7 mg/dL (ref 8.7–10.2)
Chloride: 96 mmol/L (ref 96–106)
Creatinine, Ser: 8.09 mg/dL — ABNORMAL HIGH (ref 0.76–1.27)
Glucose: 120 mg/dL — ABNORMAL HIGH (ref 70–99)
Potassium: 4 mmol/L (ref 3.5–5.2)
Sodium: 137 mmol/L (ref 134–144)
eGFR: 7 mL/min/{1.73_m2} — ABNORMAL LOW (ref >59)

## 2022-02-21 LAB — PRO B NATRIURETIC PEPTIDE: NT-Pro BNP: 22536 pg/mL — ABNORMAL HIGH (ref 0–210)

## 2022-02-21 NOTE — Addendum Note (Signed)
Addended by: Michelle Nasuti on: 02/21/2022 01:42 PM ? ? Modules accepted: Orders ? ?

## 2022-02-23 DIAGNOSIS — N186 End stage renal disease: Secondary | ICD-10-CM | POA: Diagnosis not present

## 2022-02-23 DIAGNOSIS — Z992 Dependence on renal dialysis: Secondary | ICD-10-CM | POA: Diagnosis not present

## 2022-02-23 DIAGNOSIS — I129 Hypertensive chronic kidney disease with stage 1 through stage 4 chronic kidney disease, or unspecified chronic kidney disease: Secondary | ICD-10-CM | POA: Diagnosis not present

## 2022-02-24 ENCOUNTER — Ambulatory Visit: Payer: Medicare Other | Admitting: Cardiovascular Disease

## 2022-02-24 DIAGNOSIS — D631 Anemia in chronic kidney disease: Secondary | ICD-10-CM | POA: Diagnosis not present

## 2022-02-24 DIAGNOSIS — Z992 Dependence on renal dialysis: Secondary | ICD-10-CM | POA: Diagnosis not present

## 2022-02-24 DIAGNOSIS — R634 Abnormal weight loss: Secondary | ICD-10-CM | POA: Diagnosis not present

## 2022-02-24 DIAGNOSIS — D509 Iron deficiency anemia, unspecified: Secondary | ICD-10-CM | POA: Diagnosis not present

## 2022-02-24 DIAGNOSIS — R5383 Other fatigue: Secondary | ICD-10-CM | POA: Diagnosis not present

## 2022-02-24 DIAGNOSIS — N186 End stage renal disease: Secondary | ICD-10-CM | POA: Diagnosis not present

## 2022-02-24 DIAGNOSIS — N2581 Secondary hyperparathyroidism of renal origin: Secondary | ICD-10-CM | POA: Diagnosis not present

## 2022-02-25 ENCOUNTER — Telehealth: Payer: Self-pay | Admitting: *Deleted

## 2022-02-25 NOTE — Telephone Encounter (Signed)
-----   Message from Liliane Shi, [place] sent at 02/25/2022  1:37 PM EDT ----- ?Regarding: RE: Patient not taking apixaban ?Can you make sure he resumes ASA 81 mg once daily? ?Dr. Quentin Ore and Dr. [name] reviewed his chart and he does not need Eliquis.  He stopped this a while ago.  I think he had a procedure coming up soon.  He should not resume ASA until after his procedure as long as his surgeon is ok with it. ?Richardson Dopp, PA-C    ?02/25/2022 1:39 PM   ?----- Message ----- ?From: [name], MD ?Sent: 02/13/2022   5:36 PM EDT ?To: Isidor Holts, RPH, Liliane Shi, PA-C, # ?Subject: RE: Patient not taking apixaban               ? ?Thanks unless Lysbeth Galas feels strongly he should be on apixaban I'd have him start ASA 81 mg daily.  ?----- Message ----- ?From: Liliane Shi, PA-C ?Sent: 02/13/2022   2:51 PM EDT ?To: Isidor Holts, RPH, [name], MD, # ?Subject: RE: Patient not taking apixaban               ? ?I agree Ronalee Belts.  The admission in Feb 2023 notes he had AF with RVR and was started on Eliquis then.  There is 1 EKG with HRs in the 160s and it looks like Supraventricular Tachycardia to me.   ?Scott  ?----- Message ----- ?From: [name], MD ?Sent: 02/13/2022   1:00 PM EDT ?To: Isidor Holts, RPH, Liliane Shi, PA-C, # ?Subject: RE: Patient not taking apixaban               ? ?Thx for letting me know. Honestly I'm not completely sure why he is on apixaban. He's had atrial tachycardia but I can't find a diagnosis of atrial fibrillation. I'll reach out to Dr Quentin Ore and we will follow-up on this.  ?----- Message ----- ?From: Isidor Holts, RPH ?Sent: 02/11/2022   4:08 PM EDT ?To: [name], MD ?Subject: Patient not taking apixaban                   ? ?While doing a medication history over the phone in preparation for Mr. Brew procedure on 02/13/22 (peritoneal catheter removal), the patient admitted he stopped taking apixaban some time ago. He was prescribed a 30 day supply  with no refills when discharged on 12/04/21. Too close to the procedure to resume apixaban but I wanted to let you know so you can advise him afterwards.   ? ? ? ? ? ?

## 2022-02-25 NOTE — Telephone Encounter (Signed)
Call placed to pt regarding message below. ?Pt aware to resume Aspirin 81 mg daily and that he does not need Eliquis. ? ?Pt already had his procedure. ? ?Med list updated. ?

## 2022-02-26 DIAGNOSIS — N186 End stage renal disease: Secondary | ICD-10-CM | POA: Diagnosis not present

## 2022-02-26 DIAGNOSIS — R5383 Other fatigue: Secondary | ICD-10-CM | POA: Diagnosis not present

## 2022-02-26 DIAGNOSIS — Z992 Dependence on renal dialysis: Secondary | ICD-10-CM | POA: Diagnosis not present

## 2022-02-26 DIAGNOSIS — R634 Abnormal weight loss: Secondary | ICD-10-CM | POA: Diagnosis not present

## 2022-02-26 DIAGNOSIS — D631 Anemia in chronic kidney disease: Secondary | ICD-10-CM | POA: Diagnosis not present

## 2022-02-26 DIAGNOSIS — D509 Iron deficiency anemia, unspecified: Secondary | ICD-10-CM | POA: Diagnosis not present

## 2022-02-26 DIAGNOSIS — N2581 Secondary hyperparathyroidism of renal origin: Secondary | ICD-10-CM | POA: Diagnosis not present

## 2022-02-27 DIAGNOSIS — H5213 Myopia, bilateral: Secondary | ICD-10-CM | POA: Diagnosis not present

## 2022-02-27 DIAGNOSIS — H35033 Hypertensive retinopathy, bilateral: Secondary | ICD-10-CM | POA: Diagnosis not present

## 2022-02-28 DIAGNOSIS — N2581 Secondary hyperparathyroidism of renal origin: Secondary | ICD-10-CM | POA: Diagnosis not present

## 2022-02-28 DIAGNOSIS — Z992 Dependence on renal dialysis: Secondary | ICD-10-CM | POA: Diagnosis not present

## 2022-02-28 DIAGNOSIS — D509 Iron deficiency anemia, unspecified: Secondary | ICD-10-CM | POA: Diagnosis not present

## 2022-02-28 DIAGNOSIS — N186 End stage renal disease: Secondary | ICD-10-CM | POA: Diagnosis not present

## 2022-02-28 DIAGNOSIS — R634 Abnormal weight loss: Secondary | ICD-10-CM | POA: Diagnosis not present

## 2022-02-28 DIAGNOSIS — R5383 Other fatigue: Secondary | ICD-10-CM | POA: Diagnosis not present

## 2022-02-28 DIAGNOSIS — D631 Anemia in chronic kidney disease: Secondary | ICD-10-CM | POA: Diagnosis not present

## 2022-03-02 ENCOUNTER — Other Ambulatory Visit: Payer: Self-pay | Admitting: Internal Medicine

## 2022-03-03 DIAGNOSIS — D631 Anemia in chronic kidney disease: Secondary | ICD-10-CM | POA: Diagnosis not present

## 2022-03-03 DIAGNOSIS — D509 Iron deficiency anemia, unspecified: Secondary | ICD-10-CM | POA: Diagnosis not present

## 2022-03-03 DIAGNOSIS — N2581 Secondary hyperparathyroidism of renal origin: Secondary | ICD-10-CM | POA: Diagnosis not present

## 2022-03-03 DIAGNOSIS — N186 End stage renal disease: Secondary | ICD-10-CM | POA: Diagnosis not present

## 2022-03-03 DIAGNOSIS — R5383 Other fatigue: Secondary | ICD-10-CM | POA: Diagnosis not present

## 2022-03-03 DIAGNOSIS — Z992 Dependence on renal dialysis: Secondary | ICD-10-CM | POA: Diagnosis not present

## 2022-03-03 DIAGNOSIS — R634 Abnormal weight loss: Secondary | ICD-10-CM | POA: Diagnosis not present

## 2022-03-05 DIAGNOSIS — Z992 Dependence on renal dialysis: Secondary | ICD-10-CM | POA: Diagnosis not present

## 2022-03-05 DIAGNOSIS — D631 Anemia in chronic kidney disease: Secondary | ICD-10-CM | POA: Diagnosis not present

## 2022-03-05 DIAGNOSIS — N2581 Secondary hyperparathyroidism of renal origin: Secondary | ICD-10-CM | POA: Diagnosis not present

## 2022-03-05 DIAGNOSIS — D509 Iron deficiency anemia, unspecified: Secondary | ICD-10-CM | POA: Diagnosis not present

## 2022-03-05 DIAGNOSIS — R634 Abnormal weight loss: Secondary | ICD-10-CM | POA: Diagnosis not present

## 2022-03-05 DIAGNOSIS — N186 End stage renal disease: Secondary | ICD-10-CM | POA: Diagnosis not present

## 2022-03-05 DIAGNOSIS — R5383 Other fatigue: Secondary | ICD-10-CM | POA: Diagnosis not present

## 2022-03-07 DIAGNOSIS — D509 Iron deficiency anemia, unspecified: Secondary | ICD-10-CM | POA: Diagnosis not present

## 2022-03-07 DIAGNOSIS — N186 End stage renal disease: Secondary | ICD-10-CM | POA: Diagnosis not present

## 2022-03-07 DIAGNOSIS — N2581 Secondary hyperparathyroidism of renal origin: Secondary | ICD-10-CM | POA: Diagnosis not present

## 2022-03-07 DIAGNOSIS — R5383 Other fatigue: Secondary | ICD-10-CM | POA: Diagnosis not present

## 2022-03-07 DIAGNOSIS — R634 Abnormal weight loss: Secondary | ICD-10-CM | POA: Diagnosis not present

## 2022-03-07 DIAGNOSIS — Z992 Dependence on renal dialysis: Secondary | ICD-10-CM | POA: Diagnosis not present

## 2022-03-07 DIAGNOSIS — D631 Anemia in chronic kidney disease: Secondary | ICD-10-CM | POA: Diagnosis not present

## 2022-03-10 DIAGNOSIS — Z992 Dependence on renal dialysis: Secondary | ICD-10-CM | POA: Diagnosis not present

## 2022-03-10 DIAGNOSIS — D631 Anemia in chronic kidney disease: Secondary | ICD-10-CM | POA: Diagnosis not present

## 2022-03-10 DIAGNOSIS — N186 End stage renal disease: Secondary | ICD-10-CM | POA: Diagnosis not present

## 2022-03-10 DIAGNOSIS — R5383 Other fatigue: Secondary | ICD-10-CM | POA: Diagnosis not present

## 2022-03-10 DIAGNOSIS — D509 Iron deficiency anemia, unspecified: Secondary | ICD-10-CM | POA: Diagnosis not present

## 2022-03-10 DIAGNOSIS — N2581 Secondary hyperparathyroidism of renal origin: Secondary | ICD-10-CM | POA: Diagnosis not present

## 2022-03-10 DIAGNOSIS — R634 Abnormal weight loss: Secondary | ICD-10-CM | POA: Diagnosis not present

## 2022-03-10 NOTE — Telephone Encounter (Signed)
Patient called regarding Mychart message. Patient currently agrees to continue care at [facility] and will follow up with Dr. Juleen China in August. Patient also inquired about Covid vaccination. Advised patient Immunocompromised patients who have had 1 bivalent vaccine can receive an additional dose 2 months or more from the previous bivalent vaccine. Patient agrees Bivalent dose #2 tomorrow.  ?Eugenia Mcalpine ? ? ?

## 2022-03-11 ENCOUNTER — Encounter: Payer: Self-pay | Admitting: Allergy & Immunology

## 2022-03-11 ENCOUNTER — Ambulatory Visit (INDEPENDENT_AMBULATORY_CARE_PROVIDER_SITE_OTHER): Payer: Medicare Other

## 2022-03-11 ENCOUNTER — Other Ambulatory Visit: Payer: Self-pay

## 2022-03-11 ENCOUNTER — Ambulatory Visit (INDEPENDENT_AMBULATORY_CARE_PROVIDER_SITE_OTHER): Payer: Medicare Other | Admitting: Allergy & Immunology

## 2022-03-11 VITALS — BP 136/80 | HR 85 | Temp 98.2°F | Resp 16 | Ht 69.5 in | Wt 198.4 lb

## 2022-03-11 DIAGNOSIS — Z23 Encounter for immunization: Secondary | ICD-10-CM

## 2022-03-11 DIAGNOSIS — J3089 Other allergic rhinitis: Secondary | ICD-10-CM | POA: Diagnosis not present

## 2022-03-11 DIAGNOSIS — J4530 Mild persistent asthma, uncomplicated: Secondary | ICD-10-CM

## 2022-03-11 MED ORDER — FLUTICASONE PROPIONATE HFA 110 MCG/ACT IN AERO: 2.0000 | INHALATION_SPRAY | Freq: Two times a day (BID) | RESPIRATORY_TRACT | 5 refills | Status: DC

## 2022-03-11 MED ORDER — LEVALBUTEROL TARTRATE 45 MCG/ACT IN AERO: 2.0000 | INHALATION_SPRAY | RESPIRATORY_TRACT | 5 refills | Status: DC | PRN

## 2022-03-11 MED ORDER — BUDESONIDE-FORMOTEROL FUMARATE 160-4.5 MCG/ACT IN AERO
2.0000 | INHALATION_SPRAY | Freq: Every day | RESPIRATORY_TRACT | 5 refills | Status: DC
Start: 2022-03-11 — End: 2022-08-06

## 2022-03-11 NOTE — Progress Notes (Signed)
? ?FOLLOW UP ? ?Date of Service/Encounter:  03/11/22 ? ? ?Assessment:  ? ?Mild persistent asthma, uncomplicated - with better control with daily ICS use (we do have room to increase if needed in the future) ?  ?Perennial allergic rhinitis (dust mites) ?  ?HIV - on HAART ?  ?ESRD - on transplant list  ?  ?Dysgeusia - unknown cause (cleared by ENT) ?  ? ?Plan/Recommendations:  ? ?1. Mild persistent asthma, uncomplicated ?- Lung testing looked slightly lower today, but it did improve with the Xopenex treatment. ?- We are going to STOP the Flovent and START Symbicort. ?- Symbicort contains long acting albuterol combined with an inhaled steroid. ?- I am going to try to avoid starting prednisone. ?- Call us in 2-3 days with an update. ?- Daily controller medication(s): Symbicort 160/4.110mg two puffs once daily with spacer ?- Prior to physical activity: albuterol 2 puffs 10-15 minutes before physical activity. ?- Rescue medications: albuterol 4 puffs every 4-6 hours as needed ?- Changes during respiratory infections or worsening symptoms: Add on Flovent 1123m to 2 puffs twice daily for TWO WEEKS. ?- Asthma control goals:  ?* Full participation in all desired activities (may need albuterol before activity) ?* Albuterol use two time or less a week on average (not counting use with activity) ?* Cough interfering with sleep two time or less a month ?* Oral steroids no more than once a year ?* No hospitalizations ? ?2. Perennial allergic conjunctivitis of both eyes ?- Continue with Pataday one drop per eye twice daily. ?- Continue with Systane eye drops as needed. ?- Continue with the nose sprays as needed.  ? ?3. Return in about 2 months (around 05/11/2022).  ? ?Subjective:  ? ?KeManual Navarraarch is a [age].o. male presenting today for follow up of  ?Chief Complaint  ?Patient presents with  ? Asthma  ?  April had 3 weeks in a row where he had trouble breathing. Patient thought it was his asthma but a doctor told him that he had  too much fluid on his chest.  ? Allergic Rhinitis   ?  Post nasal drip gets so bad that it makes him sick.  ? ? ?KeDrayden Lukasarch has a history of the following: ?Patient Active Problem List  ? Diagnosis Date Noted  ? Atrial fibrillation with RVR (HCOld [place]02/03/2022  ? Laryngopharyngeal reflux (LPR) 11/11/2021  ? Chest pain of uncertain etiology   ? Elevated troponin   ? NSTEMI (non-ST elevated myocardial infarction) (HCWest Scio11/05/2021  ? Essential hypertension 09/03/2021  ? Renal cell carcinoma (HCLog Cabin09/10/2020  ? Generalized (acute) peritonitis (HCMaquoketa08/29/2022  ? Other disorders of bilirubin metabolism 06/24/2021  ? Renal mass 02/18/2021  ? Pain, unspecified 02/08/2021  ? SVT (supraventricular tachycardia) (HCGreat Neck03/25/2022  ? Lower abdominal pain 06/26/2020  ? Contact with and (suspected) exposure to tuberculosis 04/02/2020  ? Fluid overload, unspecified 04/02/2020  ? Pulsatile tinnitus of right ear 11/21/2019  ? Kidney failure 11/21/2019  ? Acute diastolic CHF (congestive heart failure) (HCSavonburg01/25/2021  ? Chills (without fever) 11/07/2019  ? Hypercalcemia 10/17/2019  ? Dependence on renal dialysis (HCMedical Lake08/07/2019  ? Coagulation defect, unspecified (HCKensington Park07/27/2020  ? Iron deficiency anemia, unspecified 05/16/2019  ? Anemia in other chronic diseases classified elsewhere 05/13/2019  ? Arteriovenous fistula, acquired (HCEast Dennis07/17/2020  ? Body mass index (BMI) 28.0-28.9, adult 05/13/2019  ? Crohn's disease of small intestine without complications (HCCourtland0793/81/8299? Encounter for screening for respiratory tuberculosis 05/13/2019  ?  Gout, unspecified 05/13/2019  ? Nausea 05/13/2019  ? Other fatigue 05/13/2019  ? Secondary hyperparathyroidism of renal origin ([place]) 05/13/2019  ? ESRD (end stage renal disease) on dialysis ([place]) 05/13/2019  ? Chronic diastolic (congestive) heart failure ([place]) 05/13/2019  ? Chronic kidney disease, unspecified 05/13/2019  ? Obstructive sleep apnea 05/13/2019  ? Discomfort of right ear  02/18/2019  ? Hypokalemia 06/13/2018  ? Abnormal CT scan, small bowel   ? Perennial allergic rhinitis with a predominant nonallergic component 02/09/2018  ? Allergic conjunctivitis 02/09/2018  ? Mild intermittent asthma 02/09/2018  ? Atherosclerosis 12/09/2017  ? Hypothyroidism 11/19/2017  ? Mixed hyperlipidemia 10/13/2017  ? Personal history of gout 10/13/2017  ? S/P cholecystectomy 10/13/2017  ? Congestive heart failure with LV diastolic dysfunction, NYHA class 1 ([place]) 10/13/2017  ? Hypothyroidism (acquired) 10/13/2017  ? 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 SOB (shortness of breath) 06/07/2017  ? Arthritis, gouty 01/27/2017  ? Chronic diastolic CHF (congestive heart failure) ([place]) 01/06/2017  ? OSA (obstructive sleep apnea) 01/06/2017  ? Abnormal electrocardiogram (ECG) (EKG) 10/31/2016  ? Chest pain 10/09/2016  ? HIV disease ([place]) 10/09/2016  ? Hypertensive heart disease with CHF (congestive heart failure) ([place]) 10/09/2016  ? Left-sided low back pain without sciatica 05/29/2015  ? NYHA class 2 heart failure with preserved ejection fraction ([place]) 05/15/2015  ? PAH (pulmonary artery hypertension) ([place]) 05/08/2015  ? Severe mitral regurgitation 05/04/2015  ? Prolonged Q-T interval on ECG 05/04/2015  ? ? ?History obtained from: chart review and patient. ? ?Wylie is a 56 y.o. male presenting for a follow up visit.  He was last seen in November 2022.  At that time, we did not do lab test since we have done in the month previously.  We continue with Flovent 2 puffs once daily with spacer as well as albuterol as needed.  For his allergic rhinitis, we continued with Pataday as well as Systane and the as needed use of the nasal sprays.  He endorses taste in his mouth, but we had already referred him to ENT. ? ?The last visit, he has largely done well. ? ?Asthma/Respiratory Symptom History: He has had trouble breathing for 3 weeks in April.  Somewhere along the line, he was told he had trauma to fluid in his chest.   He had gone to [place] and he had some things in storage. He thinks that the dust in the storage unit there was causing his allergies to mess up. There was a point when he was not able to breathe. This happened three weekends in a row. The third weekend, he went to the hospital. He was having a lot of trouble breathing and he thought that this was an asthma attack. This was diagnosed as a CHF exacerbation. His chest continues to be tight. He has never been on a combined ICS/LABA. He has only ever been on Flovent. Albuterol does not help the chest tightness. Lung function is slightly lower today. He does report that there is some midline discomfort in his chest. When he moves certain directions it hurts. He denies fever during this at all.  ? ?Of note, he was also admitted in February 2023 for CHF exacerbation. This was more related to PD dysfunction. ? ?Allergic Rhinitis Symptom History: He remains on his Pataday as well as nasal sprays as needed.  He did end up going to see ENT, but they had no ideas about his odd taste in his mouth.  This eventually resolved on its own. ? ?  He was admitted in April 2023 for removal of the PD catheter. He was trying the PD but it was not "cleaning the blood good enough". It was taken off. He has traditional dialysis 3 days per week, 4 hours per session.  ? ?Otherwise, there have been no changes to his past medical history, surgical history, family history, or social history. ? ? ? ?Review of Systems  ?Constitutional: Negative.  Negative for fever, malaise/fatigue and weight loss.  ?HENT: Negative.  Negative for congestion, ear discharge and ear pain.   ?Eyes:  Negative for pain, discharge and redness.  ?Respiratory:  Positive for shortness of breath. Negative for cough, sputum production and wheezing.   ?Cardiovascular: Negative.  Negative for chest pain and palpitations.  ?Gastrointestinal:  Negative for abdominal pain, diarrhea, heartburn, nausea and vomiting.  ?Skin: Negative.   Negative for itching and rash.  ?Neurological:  Negative for dizziness and headaches.  ?Endo/Heme/Allergies:  Negative for environmental allergies. Does not bruise/bleed easily.   ? ? ? ?Objective:  ? ?Blood

## 2022-03-11 NOTE — Patient Instructions (Addendum)
1. Mild persistent asthma, uncomplicated ?- Lung testing looked slightly lower today, but it did improve with the Xopenex treatment. ?- We are going to STOP the Flovent and START Symbicort. ?- Symbicort contains long acting albuterol combined with an inhaled steroid. ?- I am going to try to avoid starting prednisone. ?- Call us in 2-3 days with an update. ?- Daily controller medication(s): Symbicort 160/4.2mg two puffs once daily with spacer ?- Prior to physical activity: albuterol 2 puffs 10-15 minutes before physical activity. ?- Rescue medications: albuterol 4 puffs every 4-6 hours as needed ?- Changes during respiratory infections or worsening symptoms: Add on Flovent 1173m to 2 puffs twice daily for TWO WEEKS. ?- Asthma control goals:  ?* Full participation in all desired activities (may need albuterol before activity) ?* Albuterol use two time or less a week on average (not counting use with activity) ?* Cough interfering with sleep two time or less a month ?* Oral steroids no more than once a year ?* No hospitalizations ? ?2. Perennial allergic conjunctivitis of both eyes ?- Continue with Pataday one drop per eye twice daily. ?- Continue with Systane eye drops as needed. ?- Continue with the nose sprays as needed.  ? ?3. Return in about 2 months (around 05/11/2022).  ? ? ?Please inform usKoreaf any Emergency Department visits, hospitalizations, or changes in symptoms. Call usKoreaefore going to the ED for breathing or allergy symptoms since we might be able to fit you in for a sick visit. Feel free to contact usKoreanytime with any questions, problems, or concerns. ? ?It was a pleasure to see you again today! ? ?Websites that have reliable patient information: ?1. American Academy of Asthma, Allergy, and Immunology: www.aaaai.org ?2. Food Allergy Research and Education (FARE): foodallergy.org ?3. Mothers of Asthmatics: http://www.asthmacommunitynetwork.org ?4. Am[employer]f Allergy, Asthma, and Immunology:  wwMonthlyElectricBill.co.uk ? ?COVID-19 Vaccine Information can be found at: [email or web address] questions related to vaccine distribution or appointments, please email [email or web address] or call [phone] ? ? ? ??Like? usKorean Facebook and Instagram for our latest updates!  ?  ? ? ? ?Make sure you are registered to vote! If you have moved or changed any of your contact information, you will need to get this updated before voting! ? ?In some cases, you MAY be able to register to vote online: [email or web address] ? ? ? ?

## 2022-03-12 DIAGNOSIS — R5383 Other fatigue: Secondary | ICD-10-CM | POA: Diagnosis not present

## 2022-03-12 DIAGNOSIS — D509 Iron deficiency anemia, unspecified: Secondary | ICD-10-CM | POA: Diagnosis not present

## 2022-03-12 DIAGNOSIS — D631 Anemia in chronic kidney disease: Secondary | ICD-10-CM | POA: Diagnosis not present

## 2022-03-12 DIAGNOSIS — R634 Abnormal weight loss: Secondary | ICD-10-CM | POA: Diagnosis not present

## 2022-03-12 DIAGNOSIS — Z992 Dependence on renal dialysis: Secondary | ICD-10-CM | POA: Diagnosis not present

## 2022-03-12 DIAGNOSIS — N186 End stage renal disease: Secondary | ICD-10-CM | POA: Diagnosis not present

## 2022-03-12 DIAGNOSIS — N2581 Secondary hyperparathyroidism of renal origin: Secondary | ICD-10-CM | POA: Diagnosis not present

## 2022-03-13 ENCOUNTER — Other Ambulatory Visit: Payer: Self-pay | Admitting: Nurse Practitioner

## 2022-03-13 ENCOUNTER — Telehealth: Payer: Self-pay | Admitting: Nurse Practitioner

## 2022-03-13 DIAGNOSIS — E039 Hypothyroidism, unspecified: Secondary | ICD-10-CM

## 2022-03-13 MED ORDER — LEVOTHYROXINE SODIUM 25 MCG PO TABS: 25.0000 ug | ORAL_TABLET | Freq: Every day | ORAL | 3 refills | Status: DC

## 2022-03-13 NOTE — Telephone Encounter (Signed)
Caller Name: Neziah Karam Call back phone #: [phone]  MEDICATION(S): levothyroxine (SYNTHROID) 25 MCG tablet   Days of Med Remaining: he has plenty right now but the pharmacy was just trying to fill it for the month  Has the patient contacted their pharmacy (YES/NO)?  Yes, Matt hasnt filled it yet IF YES, when and what did the pharmacy advise?  IF NO, request that the patient contact the pharmacy for the refills in the future.             The pharmacy will send an electronic request (except for controlled medications).  Preferred Pharmacy: [facility], [address], [place], [place] [id number]  ~~~Please advise patient/caregiver to allow 2-3 business days to process RX refills.

## 2022-03-13 NOTE — Telephone Encounter (Signed)
Medication refilled

## 2022-03-14 DIAGNOSIS — N2581 Secondary hyperparathyroidism of renal origin: Secondary | ICD-10-CM | POA: Diagnosis not present

## 2022-03-14 DIAGNOSIS — R5383 Other fatigue: Secondary | ICD-10-CM | POA: Diagnosis not present

## 2022-03-14 DIAGNOSIS — D631 Anemia in chronic kidney disease: Secondary | ICD-10-CM | POA: Diagnosis not present

## 2022-03-14 DIAGNOSIS — D509 Iron deficiency anemia, unspecified: Secondary | ICD-10-CM | POA: Diagnosis not present

## 2022-03-14 DIAGNOSIS — Z992 Dependence on renal dialysis: Secondary | ICD-10-CM | POA: Diagnosis not present

## 2022-03-14 DIAGNOSIS — R634 Abnormal weight loss: Secondary | ICD-10-CM | POA: Diagnosis not present

## 2022-03-14 DIAGNOSIS — N186 End stage renal disease: Secondary | ICD-10-CM | POA: Diagnosis not present

## 2022-03-17 ENCOUNTER — Telehealth: Payer: Self-pay | Admitting: Allergy & Immunology

## 2022-03-17 DIAGNOSIS — D631 Anemia in chronic kidney disease: Secondary | ICD-10-CM | POA: Diagnosis not present

## 2022-03-17 DIAGNOSIS — N2581 Secondary hyperparathyroidism of renal origin: Secondary | ICD-10-CM | POA: Diagnosis not present

## 2022-03-17 DIAGNOSIS — R5383 Other fatigue: Secondary | ICD-10-CM | POA: Diagnosis not present

## 2022-03-17 DIAGNOSIS — N186 End stage renal disease: Secondary | ICD-10-CM | POA: Diagnosis not present

## 2022-03-17 DIAGNOSIS — R634 Abnormal weight loss: Secondary | ICD-10-CM | POA: Diagnosis not present

## 2022-03-17 DIAGNOSIS — D509 Iron deficiency anemia, unspecified: Secondary | ICD-10-CM | POA: Diagnosis not present

## 2022-03-17 DIAGNOSIS — Z992 Dependence on renal dialysis: Secondary | ICD-10-CM | POA: Diagnosis not present

## 2022-03-17 NOTE — Telephone Encounter (Signed)
Pt called to give Dr. Ernst Bowler his update. Patient just got the symbicort and has only used it for one night. Patient does think it is helping.   Best contact number: [phone]

## 2022-03-18 NOTE — Telephone Encounter (Signed)
Great!  I am glad he called [place] back.

## 2022-03-19 DIAGNOSIS — N186 End stage renal disease: Secondary | ICD-10-CM | POA: Diagnosis not present

## 2022-03-19 DIAGNOSIS — R5383 Other fatigue: Secondary | ICD-10-CM | POA: Diagnosis not present

## 2022-03-19 DIAGNOSIS — Z992 Dependence on renal dialysis: Secondary | ICD-10-CM | POA: Diagnosis not present

## 2022-03-19 DIAGNOSIS — R634 Abnormal weight loss: Secondary | ICD-10-CM | POA: Diagnosis not present

## 2022-03-19 DIAGNOSIS — N2581 Secondary hyperparathyroidism of renal origin: Secondary | ICD-10-CM | POA: Diagnosis not present

## 2022-03-19 DIAGNOSIS — D631 Anemia in chronic kidney disease: Secondary | ICD-10-CM | POA: Diagnosis not present

## 2022-03-19 DIAGNOSIS — D509 Iron deficiency anemia, unspecified: Secondary | ICD-10-CM | POA: Diagnosis not present

## 2022-03-20 ENCOUNTER — Ambulatory Visit (INDEPENDENT_AMBULATORY_CARE_PROVIDER_SITE_OTHER): Payer: Medicare Other | Admitting: Pulmonary Disease

## 2022-03-20 ENCOUNTER — Encounter: Payer: Self-pay | Admitting: Pulmonary Disease

## 2022-03-20 VITALS — BP 112/78 | HR 82 | Temp 98.5°F | Ht 69.5 in | Wt 198.0 lb

## 2022-03-20 DIAGNOSIS — G4733 Obstructive sleep apnea (adult) (pediatric): Secondary | ICD-10-CM

## 2022-03-20 NOTE — Patient Instructions (Signed)
Referral to Dr. Jonette Eva for evaluation for moderate obstructive sleep apnea intolerant of CPAP therapy  Albert Eaton will be an option of treatment Surgical interventions may also be considered  Patient with end-stage renal disease on dialysis  I will see you back in 3 months  Call with significant concerns

## 2022-03-20 NOTE — Progress Notes (Signed)
Subjective:    Patient ID: Albert Eaton, male    DOB: 01-18-1966, 56 y.o.   MRN: [id number]  He was initially diagnosed with mild obstructive sleep apnea was able to tolerate CPAP  Most recent study showed moderate obstructive sleep apnea with moderate reduction in oxygen saturations  He has end-stage renal disease for which he is on hemodialysis tolerated peritoneal dialysis for well  He cannot tolerate anything over his face, even regular mass make him feel like he is suffocating  Has not tried his CPAP in a long time  He does have a history of insomnia still on Klonopin  Significant history of restless legs related to neuropathy  Neurontin helps his neuropathy  Last time he was here we had adjusted CPAP pressures, has tried a different mask to no avail  He still has nonrestorative sleep, frequent awakenings, daytime sleepiness, mouth dryness, occasional headaches in the mornings History of heart disease, end-stage renal disease on dialysis  His weight is stable  Does get short of breath with activity  Remains on antiretrovirals   Past Medical History:  Diagnosis Date   Anemia    Aortic atherosclerosis (HCC)    Arthritis    Asthma    Cancer ([place])    renal cyst   Chronic diastolic CHF (congestive heart failure) ([place]) 01/06/2017   Echo 7/16 [facility] in [place], [name]) Mild AI, mild LAE, mild concentric LVH, EF 55, normal wall motion, mild to moderate MR, mild PI, RVSP 55 // Echo 10/09/16 ([facility]):  Moderate LVH, grade 2 diastolic dysfunction, mild MR, moderate LAE    CKD (chronic kidney disease)    Dialysis Mon Wed Fri   Esophagitis    GERD (gastroesophageal reflux disease)    Gout    no current problems   Heart murmur    never caused any problems   Hepatitis    Hep B   History of nuclear stress test    a. Nuc study 7/16: no scar or ischemia, EF 42 // b. Nuc study 12/17: EF 48, ?small apical ischemia, Low Risk   HIV (human immunodeficiency virus  infection) ([place])    HLD (hyperlipidemia)    Hypertension    Hypertensive heart disease with CHF (congestive heart failure) ([place]) 10/09/2016   Hypothyroidism    Mitral regurgitation    OSA (obstructive sleep apnea)    Post-operative nausea and vomiting    1 time as a child, no problems as an adult   Sleep apnea    uses c-pap   Thyroid disease    Wears glasses    Wears partial dentures    Family History  Problem Relation Age of Onset   Hypertension Mother    Heart failure Mother    Hypertension Father    Alcohol abuse Father    Hypertension Sister    Multiple sclerosis Sister    Hypertension Sister    Hypertension Brother    Heart attack Maternal Grandmother 53   Scoliosis Other    Allergic rhinitis Neg Hx    Angioedema Neg Hx    Asthma Neg Hx    Eczema Neg Hx    Immunodeficiency Neg Hx    Urticaria Neg Hx    Colon cancer Neg Hx    Pancreatic cancer Neg Hx    Esophageal cancer Neg Hx    Social History   Socioeconomic History   Marital status: Single    Spouse name: Not on file   Number of children: Not on  file   Years of education: Not on file   Highest education level: Not on file  Occupational History   Not on file  Tobacco Use   Smoking status: Former    Packs/day: 1.00    Years: 18.00    Pack years: 18.00    Types: Cigarettes    Quit date: 2000    Years since quitting: 23.4   Smokeless tobacco: Never  Vaping Use   Vaping Use: Never used  Substance and Sexual Activity   Alcohol use: Not Currently   Drug use: No   Sexual activity: Not Currently    Partners: Male  Other Topics Concern   Not on file  Social History Narrative   Disability      [place]: none   Social Determinants of Health   Financial Resource Strain: Not on file  Food Insecurity: Not on file  Transportation Needs: Not on file  Physical Activity: Not on file  Stress: Not on file  Social Connections: Not on file  Intimate Partner Violence: Not on file     Review of Systems   Constitutional: Negative.   HENT:  Positive for congestion. Negative for sneezing.   Eyes: Negative.   Respiratory:  Positive for apnea.   Cardiovascular: Negative.   Gastrointestinal: Negative.   Genitourinary: Negative.   Psychiatric/Behavioral:  Positive for sleep disturbance.   All other systems reviewed and are negative.     Objective:   Physical Exam Constitutional:      Appearance: Normal appearance.  HENT:     Head: Normocephalic.     Nose: No congestion.     Mouth/Throat:     Comments: He does have macroglossia, Mallampati 3 Eyes:     General:        Right eye: No discharge.        Left eye: No discharge.     Pupils: Pupils are equal, round, and reactive to light.  Cardiovascular:     Pulses: Normal pulses.     Heart sounds: Normal heart sounds. No murmur heard.   No friction rub.  Pulmonary:     Effort: Pulmonary effort is normal. No respiratory distress.     Breath sounds: Normal breath sounds. No stridor. No wheezing or rhonchi.  Musculoskeletal:     Cervical back: No rigidity.  Neurological:     Mental Status: He is alert.  Psychiatric:        Mood and Affect: Mood normal.  Polysomnogram from 12/24/2017 reviewed revealing mild obstructive sleep apnea  Home sleep study from 11/12/2021 reviewed by myself showing moderate obstructive sleep apnea with AHI of 23.3, moderately severe oxygen desaturations  Compliance data not available as he has not been using the machine  Assessment & Plan:  .  Moderate obstructive sleep apnea -Struggled with CPAP for many years -We have adjusted pressures multiple times, has tried different interfaces -Regular surgical mask even make him claustrophobic, feels like he is suffocating  -Has not tried to use CPAP in many months  Sleep onset and sleep maintenance insomnia -Trial with Klonopin which should help his insomnia and also help his restless legs -Still has significant issues with his restless legs -This is felt to be  related to his neuropathy which appears to be worsening  Restless leg -On Neurontin, on Requip -Neurontin does cause some grogginess  On dialysis 3 days a week for end-stage renal disease  Plan: No changes to medications  Referral to ENT for evaluation for an inspire device for  moderate obstructive sleep apnea and intolerance to CPAP therapy Surgical interventions for sleep apnea may also be considered  He is aware that sleep apnea is contributing to nonrestorative sleep and daytime sleepiness and wants to attempt treatment  Tentative follow-up in 3 months  He has tried CPAP faithfully and is unable to tolerated  Referral will be placed to ENT

## 2022-03-21 DIAGNOSIS — R5383 Other fatigue: Secondary | ICD-10-CM | POA: Diagnosis not present

## 2022-03-21 DIAGNOSIS — D509 Iron deficiency anemia, unspecified: Secondary | ICD-10-CM | POA: Diagnosis not present

## 2022-03-21 DIAGNOSIS — D631 Anemia in chronic kidney disease: Secondary | ICD-10-CM | POA: Diagnosis not present

## 2022-03-21 DIAGNOSIS — N2581 Secondary hyperparathyroidism of renal origin: Secondary | ICD-10-CM | POA: Diagnosis not present

## 2022-03-21 DIAGNOSIS — R634 Abnormal weight loss: Secondary | ICD-10-CM | POA: Diagnosis not present

## 2022-03-21 DIAGNOSIS — N186 End stage renal disease: Secondary | ICD-10-CM | POA: Diagnosis not present

## 2022-03-21 DIAGNOSIS — Z992 Dependence on renal dialysis: Secondary | ICD-10-CM | POA: Diagnosis not present

## 2022-03-24 DIAGNOSIS — N2581 Secondary hyperparathyroidism of renal origin: Secondary | ICD-10-CM | POA: Diagnosis not present

## 2022-03-24 DIAGNOSIS — R5383 Other fatigue: Secondary | ICD-10-CM | POA: Diagnosis not present

## 2022-03-24 DIAGNOSIS — R634 Abnormal weight loss: Secondary | ICD-10-CM | POA: Diagnosis not present

## 2022-03-24 DIAGNOSIS — D509 Iron deficiency anemia, unspecified: Secondary | ICD-10-CM | POA: Diagnosis not present

## 2022-03-24 DIAGNOSIS — Z992 Dependence on renal dialysis: Secondary | ICD-10-CM | POA: Diagnosis not present

## 2022-03-24 DIAGNOSIS — N186 End stage renal disease: Secondary | ICD-10-CM | POA: Diagnosis not present

## 2022-03-24 DIAGNOSIS — D631 Anemia in chronic kidney disease: Secondary | ICD-10-CM | POA: Diagnosis not present

## 2022-03-26 DIAGNOSIS — D509 Iron deficiency anemia, unspecified: Secondary | ICD-10-CM | POA: Diagnosis not present

## 2022-03-26 DIAGNOSIS — R634 Abnormal weight loss: Secondary | ICD-10-CM | POA: Diagnosis not present

## 2022-03-26 DIAGNOSIS — D631 Anemia in chronic kidney disease: Secondary | ICD-10-CM | POA: Diagnosis not present

## 2022-03-26 DIAGNOSIS — N186 End stage renal disease: Secondary | ICD-10-CM | POA: Diagnosis not present

## 2022-03-26 DIAGNOSIS — I129 Hypertensive chronic kidney disease with stage 1 through stage 4 chronic kidney disease, or unspecified chronic kidney disease: Secondary | ICD-10-CM | POA: Diagnosis not present

## 2022-03-26 DIAGNOSIS — Z992 Dependence on renal dialysis: Secondary | ICD-10-CM | POA: Diagnosis not present

## 2022-03-26 DIAGNOSIS — N2581 Secondary hyperparathyroidism of renal origin: Secondary | ICD-10-CM | POA: Diagnosis not present

## 2022-03-26 DIAGNOSIS — R5383 Other fatigue: Secondary | ICD-10-CM | POA: Diagnosis not present

## 2022-03-27 ENCOUNTER — Other Ambulatory Visit: Payer: Self-pay | Admitting: Physician Assistant

## 2022-03-28 DIAGNOSIS — E876 Hypokalemia: Secondary | ICD-10-CM | POA: Diagnosis not present

## 2022-03-28 DIAGNOSIS — N186 End stage renal disease: Secondary | ICD-10-CM | POA: Diagnosis not present

## 2022-03-28 DIAGNOSIS — G990 Autonomic neuropathy in diseases classified elsewhere: Secondary | ICD-10-CM | POA: Diagnosis not present

## 2022-03-28 DIAGNOSIS — D509 Iron deficiency anemia, unspecified: Secondary | ICD-10-CM | POA: Diagnosis not present

## 2022-03-28 DIAGNOSIS — Z992 Dependence on renal dialysis: Secondary | ICD-10-CM | POA: Diagnosis not present

## 2022-03-28 DIAGNOSIS — N2581 Secondary hyperparathyroidism of renal origin: Secondary | ICD-10-CM | POA: Diagnosis not present

## 2022-03-30 ENCOUNTER — Encounter: Payer: Self-pay | Admitting: Infectious Diseases

## 2022-03-31 DIAGNOSIS — N186 End stage renal disease: Secondary | ICD-10-CM | POA: Diagnosis not present

## 2022-03-31 DIAGNOSIS — Z992 Dependence on renal dialysis: Secondary | ICD-10-CM | POA: Diagnosis not present

## 2022-03-31 DIAGNOSIS — D509 Iron deficiency anemia, unspecified: Secondary | ICD-10-CM | POA: Diagnosis not present

## 2022-03-31 DIAGNOSIS — N2581 Secondary hyperparathyroidism of renal origin: Secondary | ICD-10-CM | POA: Diagnosis not present

## 2022-03-31 DIAGNOSIS — G990 Autonomic neuropathy in diseases classified elsewhere: Secondary | ICD-10-CM | POA: Diagnosis not present

## 2022-03-31 DIAGNOSIS — E876 Hypokalemia: Secondary | ICD-10-CM | POA: Diagnosis not present

## 2022-04-02 DIAGNOSIS — D509 Iron deficiency anemia, unspecified: Secondary | ICD-10-CM | POA: Diagnosis not present

## 2022-04-02 DIAGNOSIS — N186 End stage renal disease: Secondary | ICD-10-CM | POA: Diagnosis not present

## 2022-04-02 DIAGNOSIS — G990 Autonomic neuropathy in diseases classified elsewhere: Secondary | ICD-10-CM | POA: Diagnosis not present

## 2022-04-02 DIAGNOSIS — N2581 Secondary hyperparathyroidism of renal origin: Secondary | ICD-10-CM | POA: Diagnosis not present

## 2022-04-02 DIAGNOSIS — Z992 Dependence on renal dialysis: Secondary | ICD-10-CM | POA: Diagnosis not present

## 2022-04-02 DIAGNOSIS — E876 Hypokalemia: Secondary | ICD-10-CM | POA: Diagnosis not present

## 2022-04-04 DIAGNOSIS — N186 End stage renal disease: Secondary | ICD-10-CM | POA: Diagnosis not present

## 2022-04-04 DIAGNOSIS — G990 Autonomic neuropathy in diseases classified elsewhere: Secondary | ICD-10-CM | POA: Diagnosis not present

## 2022-04-04 DIAGNOSIS — Z992 Dependence on renal dialysis: Secondary | ICD-10-CM | POA: Diagnosis not present

## 2022-04-04 DIAGNOSIS — D509 Iron deficiency anemia, unspecified: Secondary | ICD-10-CM | POA: Diagnosis not present

## 2022-04-04 DIAGNOSIS — E876 Hypokalemia: Secondary | ICD-10-CM | POA: Diagnosis not present

## 2022-04-04 DIAGNOSIS — N2581 Secondary hyperparathyroidism of renal origin: Secondary | ICD-10-CM | POA: Diagnosis not present

## 2022-04-07 DIAGNOSIS — D509 Iron deficiency anemia, unspecified: Secondary | ICD-10-CM | POA: Diagnosis not present

## 2022-04-07 DIAGNOSIS — N186 End stage renal disease: Secondary | ICD-10-CM | POA: Diagnosis not present

## 2022-04-07 DIAGNOSIS — G990 Autonomic neuropathy in diseases classified elsewhere: Secondary | ICD-10-CM | POA: Diagnosis not present

## 2022-04-07 DIAGNOSIS — N2581 Secondary hyperparathyroidism of renal origin: Secondary | ICD-10-CM | POA: Diagnosis not present

## 2022-04-07 DIAGNOSIS — E876 Hypokalemia: Secondary | ICD-10-CM | POA: Diagnosis not present

## 2022-04-07 DIAGNOSIS — Z992 Dependence on renal dialysis: Secondary | ICD-10-CM | POA: Diagnosis not present

## 2022-04-09 DIAGNOSIS — Z992 Dependence on renal dialysis: Secondary | ICD-10-CM | POA: Diagnosis not present

## 2022-04-09 DIAGNOSIS — E876 Hypokalemia: Secondary | ICD-10-CM | POA: Diagnosis not present

## 2022-04-09 DIAGNOSIS — D509 Iron deficiency anemia, unspecified: Secondary | ICD-10-CM | POA: Diagnosis not present

## 2022-04-09 DIAGNOSIS — N2581 Secondary hyperparathyroidism of renal origin: Secondary | ICD-10-CM | POA: Diagnosis not present

## 2022-04-09 DIAGNOSIS — N186 End stage renal disease: Secondary | ICD-10-CM | POA: Diagnosis not present

## 2022-04-09 DIAGNOSIS — G990 Autonomic neuropathy in diseases classified elsewhere: Secondary | ICD-10-CM | POA: Diagnosis not present

## 2022-04-10 DIAGNOSIS — I081 Rheumatic disorders of both mitral and tricuspid valves: Secondary | ICD-10-CM | POA: Diagnosis not present

## 2022-04-11 DIAGNOSIS — E876 Hypokalemia: Secondary | ICD-10-CM | POA: Diagnosis not present

## 2022-04-11 DIAGNOSIS — Z992 Dependence on renal dialysis: Secondary | ICD-10-CM | POA: Diagnosis not present

## 2022-04-11 DIAGNOSIS — D509 Iron deficiency anemia, unspecified: Secondary | ICD-10-CM | POA: Diagnosis not present

## 2022-04-11 DIAGNOSIS — G990 Autonomic neuropathy in diseases classified elsewhere: Secondary | ICD-10-CM | POA: Diagnosis not present

## 2022-04-11 DIAGNOSIS — N2581 Secondary hyperparathyroidism of renal origin: Secondary | ICD-10-CM | POA: Diagnosis not present

## 2022-04-11 DIAGNOSIS — N186 End stage renal disease: Secondary | ICD-10-CM | POA: Diagnosis not present

## 2022-04-14 DIAGNOSIS — N186 End stage renal disease: Secondary | ICD-10-CM | POA: Diagnosis not present

## 2022-04-14 DIAGNOSIS — E876 Hypokalemia: Secondary | ICD-10-CM | POA: Diagnosis not present

## 2022-04-14 DIAGNOSIS — N2581 Secondary hyperparathyroidism of renal origin: Secondary | ICD-10-CM | POA: Diagnosis not present

## 2022-04-14 DIAGNOSIS — G990 Autonomic neuropathy in diseases classified elsewhere: Secondary | ICD-10-CM | POA: Diagnosis not present

## 2022-04-14 DIAGNOSIS — D509 Iron deficiency anemia, unspecified: Secondary | ICD-10-CM | POA: Diagnosis not present

## 2022-04-14 DIAGNOSIS — Z992 Dependence on renal dialysis: Secondary | ICD-10-CM | POA: Diagnosis not present

## 2022-04-15 DIAGNOSIS — M5386 Other specified dorsopathies, lumbar region: Secondary | ICD-10-CM | POA: Diagnosis not present

## 2022-04-15 DIAGNOSIS — M9905 Segmental and somatic dysfunction of pelvic region: Secondary | ICD-10-CM | POA: Diagnosis not present

## 2022-04-15 DIAGNOSIS — M9903 Segmental and somatic dysfunction of lumbar region: Secondary | ICD-10-CM | POA: Diagnosis not present

## 2022-04-15 DIAGNOSIS — M9904 Segmental and somatic dysfunction of sacral region: Secondary | ICD-10-CM | POA: Diagnosis not present

## 2022-04-16 DIAGNOSIS — Z7682 Awaiting organ transplant status: Secondary | ICD-10-CM | POA: Diagnosis not present

## 2022-04-16 DIAGNOSIS — D509 Iron deficiency anemia, unspecified: Secondary | ICD-10-CM | POA: Diagnosis not present

## 2022-04-16 DIAGNOSIS — N2581 Secondary hyperparathyroidism of renal origin: Secondary | ICD-10-CM | POA: Diagnosis not present

## 2022-04-16 DIAGNOSIS — E876 Hypokalemia: Secondary | ICD-10-CM | POA: Diagnosis not present

## 2022-04-16 DIAGNOSIS — G990 Autonomic neuropathy in diseases classified elsewhere: Secondary | ICD-10-CM | POA: Diagnosis not present

## 2022-04-16 DIAGNOSIS — N186 End stage renal disease: Secondary | ICD-10-CM | POA: Diagnosis not present

## 2022-04-16 DIAGNOSIS — Z992 Dependence on renal dialysis: Secondary | ICD-10-CM | POA: Diagnosis not present

## 2022-04-17 ENCOUNTER — Ambulatory Visit (INDEPENDENT_AMBULATORY_CARE_PROVIDER_SITE_OTHER): Payer: Medicare Other | Admitting: Infectious Diseases

## 2022-04-17 ENCOUNTER — Other Ambulatory Visit: Payer: Self-pay

## 2022-04-17 VITALS — BP 147/86 | HR 79 | Resp 16 | Ht 69.5 in | Wt 198.0 lb

## 2022-04-17 DIAGNOSIS — Z79899 Other long term (current) drug therapy: Secondary | ICD-10-CM

## 2022-04-17 DIAGNOSIS — Z992 Dependence on renal dialysis: Secondary | ICD-10-CM | POA: Diagnosis not present

## 2022-04-17 DIAGNOSIS — I770 Arteriovenous fistula, acquired: Secondary | ICD-10-CM

## 2022-04-17 DIAGNOSIS — M9904 Segmental and somatic dysfunction of sacral region: Secondary | ICD-10-CM | POA: Diagnosis not present

## 2022-04-17 DIAGNOSIS — I4891 Unspecified atrial fibrillation: Secondary | ICD-10-CM

## 2022-04-17 DIAGNOSIS — Z113 Encounter for screening for infections with a predominantly sexual mode of transmission: Secondary | ICD-10-CM

## 2022-04-17 DIAGNOSIS — M5386 Other specified dorsopathies, lumbar region: Secondary | ICD-10-CM | POA: Diagnosis not present

## 2022-04-17 DIAGNOSIS — N186 End stage renal disease: Secondary | ICD-10-CM | POA: Diagnosis not present

## 2022-04-17 DIAGNOSIS — B20 Human immunodeficiency virus [HIV] disease: Secondary | ICD-10-CM | POA: Diagnosis not present

## 2022-04-17 DIAGNOSIS — I5030 Unspecified diastolic (congestive) heart failure: Secondary | ICD-10-CM

## 2022-04-17 DIAGNOSIS — M9905 Segmental and somatic dysfunction of pelvic region: Secondary | ICD-10-CM | POA: Diagnosis not present

## 2022-04-17 DIAGNOSIS — M9903 Segmental and somatic dysfunction of lumbar region: Secondary | ICD-10-CM | POA: Diagnosis not present

## 2022-04-17 NOTE — Assessment & Plan Note (Signed)
Currently asx.

## 2022-04-17 NOTE — Assessment & Plan Note (Signed)
He appears to be doing well Labs rechecked today to reassure the txp folks.  He swears adherence.  vax are uptodate.  rtc in 9 months.  Labs entered for future.

## 2022-04-17 NOTE — Progress Notes (Signed)
Subjective:    Patient ID: Albert Eaton, male  DOB: April 01, 1966, 56 y.o.        MRN: [id number]   HPI 56 y.o. M with a history of CHF (nonischemic non-dilated CM with NYHA 2/C HF preserved EF 55% with moderate concentric hypertrophy and a grade 2 diastolic dysfunction. Moderate mitral valve insufficiency), CKD V (started HD 05-13-19), HTN, HLD, OSA, neuropathy, and HIV (dx 1991). Also chronic constipation (on amtyza and prn lactulose). Ashtma, GERD.  He had kidney bx 9-18 showing focal nephrosclerosis, interstitial fibrosis.    Had nephrectomy 01-2021 due to ?cyst on his kidney. It was found to be Cancer. Surgery at [facility].         - RENAL CELL CARCINOMA, PAPILLARY RENAL CELL CARCINOMA , TYPE 1, pT1a pNx - RENAL CELL CARCINOMA, ACQUIRED CYSTIC DISEASE ASSOCIATED RENAL CELL CARCINOMA, pT1a, pNx.     Nephrologist- Dr Rodman Key.  Was adm to hospital 11-2021 with afib (converted on cadizem).  Occas forgets to take his meds and can feel his heart racing.  No problems with ART (biktarvy)  On renal txp list in [place]/[facility], [facility].    Here today because his labs weren't right for his txp. His VL went up in [place] after his meds were stopped (by whom?).  Had PD removed 01-2022 and is on HD now. No problems with fistula.   HIV 1 RNA Quant (Copies/mL)  Date Value  01/13/2022 11,900 (H)  06/27/2021 Not Detected  06/27/2020 <20   CD4 T Cell Abs (/uL)  Date Value  06/27/2021 686  06/27/2020 1,175  10/06/2019 916     Health Maintenance  Topic Date Due  . COLONOSCOPY (Pts 45-10yr Insurance coverage will need to be confirmed)  05/02/2026  . TETANUS/TDAP  01/28/2027  . COVID-19 Vaccine  Completed  . Hepatitis C Screening  Completed  . HIV Screening  Completed  . Zoster Vaccines- Shingrix  Completed  . HPV VACCINES  Aged Out      Review of Systems  Constitutional:  Negative for chills, fever and weight loss.  Respiratory:  Positive for shortness of breath (related to his asthma and  recent smoke, has seen allergy.).   Cardiovascular:  Negative for chest pain and leg swelling.  Gastrointestinal:  Negative for constipation and diarrhea.    Please see HPI. All other systems reviewed and negative.     Objective:  Physical Exam Vitals reviewed.  Constitutional:      Appearance: Normal appearance.  HENT:     Mouth/Throat:     Mouth: Mucous membranes are moist.     Pharynx: No oropharyngeal exudate.  Eyes:     Extraocular Movements: Extraocular movements intact.     Pupils: Pupils are equal, round, and reactive to light.  Cardiovascular:     Rate and Rhythm: Normal rate and regular rhythm.  Pulmonary:     Effort: Pulmonary effort is normal.     Breath sounds: Normal breath sounds.  Abdominal:     General: Bowel sounds are normal. There is no distension.     Palpations: Abdomen is soft.     Tenderness: There is no abdominal tenderness.  Musculoskeletal:       Arms:     Cervical back: Normal range of motion and neck supple.     Right lower leg: No edema.     Left lower leg: No edema.     Comments: AVF clean, +bruit.   Neurological:     General: No focal deficit present.  Mental Status: He is alert.          Assessment & Plan:

## 2022-04-17 NOTE — Assessment & Plan Note (Signed)
Clean, healthy.  +Bruit.

## 2022-04-17 NOTE — Assessment & Plan Note (Signed)
He is doing well. Except for mild increase in BP.  Will forward to Dr Hollie Salk

## 2022-04-18 ENCOUNTER — Telehealth: Payer: Self-pay

## 2022-04-18 DIAGNOSIS — D509 Iron deficiency anemia, unspecified: Secondary | ICD-10-CM | POA: Diagnosis not present

## 2022-04-18 DIAGNOSIS — Z992 Dependence on renal dialysis: Secondary | ICD-10-CM | POA: Diagnosis not present

## 2022-04-18 DIAGNOSIS — E876 Hypokalemia: Secondary | ICD-10-CM | POA: Diagnosis not present

## 2022-04-18 DIAGNOSIS — N186 End stage renal disease: Secondary | ICD-10-CM | POA: Diagnosis not present

## 2022-04-18 DIAGNOSIS — G990 Autonomic neuropathy in diseases classified elsewhere: Secondary | ICD-10-CM | POA: Diagnosis not present

## 2022-04-18 DIAGNOSIS — N2581 Secondary hyperparathyroidism of renal origin: Secondary | ICD-10-CM | POA: Diagnosis not present

## 2022-04-18 LAB — T-HELPER CELL (CD4) - (RCID CLINIC ONLY)
CD4 % Helper T Cell: 39 % (ref 33–65)
CD4 T Cell Abs: 694 /uL (ref 400–1790)

## 2022-04-20 LAB — CBC
HCT: 31.7 % — ABNORMAL LOW (ref 38.5–50.0)
Hemoglobin: 10.9 g/dL — ABNORMAL LOW (ref 13.2–17.1)
MCH: 32.1 pg (ref 27.0–33.0)
MCHC: 34.4 g/dL (ref 32.0–36.0)
MCV: 93.2 fL (ref 80.0–100.0)
MPV: 11.3 fL (ref 7.5–12.5)
Platelets: 224 10*3/uL (ref 140–400)
RBC: 3.4 10*6/uL — ABNORMAL LOW (ref 4.20–5.80)
RDW: 14.3 % (ref 11.0–15.0)
WBC: 5.4 10*3/uL (ref 3.8–10.8)

## 2022-04-20 LAB — LIPID PANEL
Cholesterol: 113 mg/dL (ref <200)
HDL: 35 mg/dL — ABNORMAL LOW (ref >=40)
LDL Cholesterol (Calc): 57 mg/dL (calc)
Non-HDL Cholesterol (Calc): 78 mg/dL (calc) (ref <130)
Total CHOL/HDL Ratio: 3.2 (calc) (ref <5.0)
Triglycerides: 131 mg/dL (ref <150)

## 2022-04-20 LAB — COMPREHENSIVE METABOLIC PANEL
AG Ratio: 1.5 (calc) (ref 1.0–2.5)
ALT: 9 U/L (ref 9–46)
AST: 12 U/L (ref 10–35)
Albumin: 4.6 g/dL (ref 3.6–5.1)
Alkaline phosphatase (APISO): 95 U/L (ref 35–144)
BUN/Creatinine Ratio: 3 (calc) — ABNORMAL LOW (ref 6–22)
BUN: 34 mg/dL — ABNORMAL HIGH (ref 7–25)
CO2: 26 mmol/L (ref 20–32)
Calcium: 9.9 mg/dL (ref 8.6–10.3)
Chloride: 96 mmol/L — ABNORMAL LOW (ref 98–110)
Creat: 9.83 mg/dL — ABNORMAL HIGH (ref 0.70–1.30)
Globulin: 3 g/dL (calc) (ref 1.9–3.7)
Glucose, Bld: 97 mg/dL (ref 65–99)
Potassium: 3.7 mmol/L (ref 3.5–5.3)
Sodium: 137 mmol/L (ref 135–146)
Total Bilirubin: 0.4 mg/dL (ref 0.2–1.2)
Total Protein: 7.6 g/dL (ref 6.1–8.1)

## 2022-04-20 LAB — RPR: RPR Ser Ql: NONREACTIVE

## 2022-04-20 LAB — HIV-1 RNA QUANT-NO REFLEX-BLD
HIV 1 RNA Quant: NOT DETECTED Copies/mL
HIV-1 RNA Quant, Log: NOT DETECTED Log cps/mL

## 2022-04-21 DIAGNOSIS — D509 Iron deficiency anemia, unspecified: Secondary | ICD-10-CM | POA: Diagnosis not present

## 2022-04-21 DIAGNOSIS — N186 End stage renal disease: Secondary | ICD-10-CM | POA: Diagnosis not present

## 2022-04-21 DIAGNOSIS — N2581 Secondary hyperparathyroidism of renal origin: Secondary | ICD-10-CM | POA: Diagnosis not present

## 2022-04-21 DIAGNOSIS — E876 Hypokalemia: Secondary | ICD-10-CM | POA: Diagnosis not present

## 2022-04-21 DIAGNOSIS — Z992 Dependence on renal dialysis: Secondary | ICD-10-CM | POA: Diagnosis not present

## 2022-04-21 DIAGNOSIS — G990 Autonomic neuropathy in diseases classified elsewhere: Secondary | ICD-10-CM | POA: Insufficient documentation

## 2022-04-23 DIAGNOSIS — Z992 Dependence on renal dialysis: Secondary | ICD-10-CM | POA: Diagnosis not present

## 2022-04-23 DIAGNOSIS — N186 End stage renal disease: Secondary | ICD-10-CM | POA: Diagnosis not present

## 2022-04-23 DIAGNOSIS — N2581 Secondary hyperparathyroidism of renal origin: Secondary | ICD-10-CM | POA: Diagnosis not present

## 2022-04-23 DIAGNOSIS — E876 Hypokalemia: Secondary | ICD-10-CM | POA: Diagnosis not present

## 2022-04-23 DIAGNOSIS — G990 Autonomic neuropathy in diseases classified elsewhere: Secondary | ICD-10-CM | POA: Diagnosis not present

## 2022-04-23 DIAGNOSIS — D509 Iron deficiency anemia, unspecified: Secondary | ICD-10-CM | POA: Diagnosis not present

## 2022-04-25 DIAGNOSIS — D509 Iron deficiency anemia, unspecified: Secondary | ICD-10-CM | POA: Diagnosis not present

## 2022-04-25 DIAGNOSIS — E876 Hypokalemia: Secondary | ICD-10-CM | POA: Diagnosis not present

## 2022-04-25 DIAGNOSIS — Z992 Dependence on renal dialysis: Secondary | ICD-10-CM | POA: Diagnosis not present

## 2022-04-25 DIAGNOSIS — I129 Hypertensive chronic kidney disease with stage 1 through stage 4 chronic kidney disease, or unspecified chronic kidney disease: Secondary | ICD-10-CM | POA: Diagnosis not present

## 2022-04-25 DIAGNOSIS — N2581 Secondary hyperparathyroidism of renal origin: Secondary | ICD-10-CM | POA: Diagnosis not present

## 2022-04-25 DIAGNOSIS — N186 End stage renal disease: Secondary | ICD-10-CM | POA: Diagnosis not present

## 2022-04-25 DIAGNOSIS — G990 Autonomic neuropathy in diseases classified elsewhere: Secondary | ICD-10-CM | POA: Diagnosis not present

## 2022-04-28 ENCOUNTER — Emergency Department (HOSPITAL_COMMUNITY)
Admission: EM | Admit: 2022-04-28 | Discharge: 2022-04-28 | Disposition: A | Discharge status: Home / Self Care | Payer: Medicare Other | Attending: Emergency Medicine | Admitting: Emergency Medicine

## 2022-04-28 ENCOUNTER — Emergency Department (HOSPITAL_COMMUNITY): Payer: Medicare Other

## 2022-04-28 ENCOUNTER — Encounter (HOSPITAL_COMMUNITY): Payer: Self-pay

## 2022-04-28 DIAGNOSIS — R0602 Shortness of breath: Secondary | ICD-10-CM | POA: Diagnosis not present

## 2022-04-28 DIAGNOSIS — R7989 Other specified abnormal findings of blood chemistry: Secondary | ICD-10-CM | POA: Insufficient documentation

## 2022-04-28 DIAGNOSIS — D509 Iron deficiency anemia, unspecified: Secondary | ICD-10-CM | POA: Diagnosis not present

## 2022-04-28 DIAGNOSIS — Z992 Dependence on renal dialysis: Secondary | ICD-10-CM | POA: Insufficient documentation

## 2022-04-28 DIAGNOSIS — N186 End stage renal disease: Secondary | ICD-10-CM | POA: Diagnosis not present

## 2022-04-28 DIAGNOSIS — N2581 Secondary hyperparathyroidism of renal origin: Secondary | ICD-10-CM | POA: Diagnosis not present

## 2022-04-28 DIAGNOSIS — D631 Anemia in chronic kidney disease: Secondary | ICD-10-CM | POA: Diagnosis not present

## 2022-04-28 DIAGNOSIS — R079 Chest pain, unspecified: Secondary | ICD-10-CM | POA: Diagnosis not present

## 2022-04-28 DIAGNOSIS — E876 Hypokalemia: Secondary | ICD-10-CM | POA: Diagnosis not present

## 2022-04-28 LAB — BASIC METABOLIC PANEL
Anion gap: 21 — ABNORMAL HIGH (ref 5–15)
BUN: 56 mg/dL — ABNORMAL HIGH (ref 6–20)
CO2: 18 mmol/L — ABNORMAL LOW (ref 22–32)
Calcium: 10.2 mg/dL (ref 8.9–10.3)
Chloride: 99 mmol/L (ref 98–111)
Creatinine, Ser: 12.68 mg/dL — ABNORMAL HIGH (ref 0.61–1.24)
GFR, Estimated: 4 mL/min — ABNORMAL LOW (ref >60)
Glucose, Bld: 101 mg/dL — ABNORMAL HIGH (ref 70–99)
Potassium: 4.2 mmol/L (ref 3.5–5.1)
Sodium: 138 mmol/L (ref 135–145)

## 2022-04-28 LAB — CBC
HCT: 29.1 % — ABNORMAL LOW (ref 39.0–52.0)
Hemoglobin: 9.6 g/dL — ABNORMAL LOW (ref 13.0–17.0)
MCH: 30.6 pg (ref 26.0–34.0)
MCHC: 33 g/dL (ref 30.0–36.0)
MCV: 92.7 fL (ref 80.0–100.0)
Platelets: 255 10*3/uL (ref 150–400)
RBC: 3.14 MIL/uL — ABNORMAL LOW (ref 4.22–5.81)
RDW: 14.7 % (ref 11.5–15.5)
WBC: 7.8 10*3/uL (ref 4.0–10.5)
nRBC: 0 % (ref 0.0–0.2)

## 2022-04-28 LAB — TROPONIN I (HIGH SENSITIVITY): Troponin I (High Sensitivity): 98 ng/L — ABNORMAL HIGH (ref <18)

## 2022-04-28 LAB — BRAIN NATRIURETIC PEPTIDE: B Natriuretic Peptide: 1809.5 pg/mL — ABNORMAL HIGH (ref 0.0–100.0)

## 2022-04-28 NOTE — Discharge Instructions (Signed)
Go to your morning dialysis.  No pneumonia was seen.  Your heart marker and blood counts were at your baseline.

## 2022-04-28 NOTE — ED Provider Notes (Signed)
[facility] Emergency Department Provider Note MRN:  [id number]  Arrival date & time: 04/28/22     Chief Complaint   Shortness of Breath and Chest Pain   History of Present Illness   Albert Eaton is a 56 y.o. year-old male presents to the ED with chief complaint of shortness of breath and chest pain since Friday.  States that he was last dialyzed on Friday.  States he is scheduled for dialysis this morning at 745.  Reports that his shortness of breath worsened to the point where he had to come be evaluated.  He reports increased phlegm.  Denies fever.  Denies any treatments prior to arrival.  History provided by patient.   Review of Systems  Pertinent review of systems noted in HPI.    Physical Exam   Vitals:   04/28/22 0416 04/28/22 0500  BP: (!) 141/88 (!) 142/93  Pulse: 89 86  Resp: 20 (!) 26  Temp: 98.4 F (36.9 C)   SpO2: 100% 100%    CONSTITUTIONAL:  nontoxic-appearing, NAD NEURO:  Alert and oriented x 3, CN 3-12 grossly intact EYES:  eyes equal and reactive ENT/NECK:  Supple, no stridor  CARDIO:  normal rate, regular rhythm, appears well-perfused  PULM:  Mild respiratory distress, increased WOB, faint wheezes GI/GU:  non-distended,  MSK/SPINE:  No gross deformities, no edema, moves all extremities  SKIN:  no rash, atraumatic   *Additional and/or pertinent findings included in MDM below  Diagnostic and Interventional Summary    EKG Interpretation  Date/Time:  Monday April 28 2022 04:19:18 EDT Ventricular Rate:  90 PR Interval:  162 QRS Duration: 80 QT Interval:  422 QTC Calculation: 516 R Axis:   82 Text Interpretation: Normal sinus rhythm Prolonged QT Abnormal ECG When compared with ECG of 09-Feb-2022 23:02, PREVIOUS ECG IS PRESENT No acute changes Confirmed by Addison Lank [id number]) on 04/28/2022 5:42:59 AM       Labs Reviewed  BASIC METABOLIC PANEL - Abnormal; Notable for the following components:      Result Value   CO2  18 (*)    Glucose, Bld 101 (*)    BUN 56 (*)    Creatinine, Ser 12.68 (*)    GFR, Estimated 4 (*)    Anion gap 21 (*)    All other components within normal limits  CBC - Abnormal; Notable for the following components:   RBC 3.14 (*)    Hemoglobin 9.6 (*)    HCT 29.1 (*)    All other components within normal limits  BRAIN NATRIURETIC PEPTIDE - Abnormal; Notable for the following components:   B Natriuretic Peptide 1,809.5 (*)    All other components within normal limits  TROPONIN I (HIGH SENSITIVITY) - Abnormal; Notable for the following components:   Troponin I (High Sensitivity) 98 (*)    All other components within normal limits  TROPONIN I (HIGH SENSITIVITY)    DG Chest Portable 1 View  Final Result      Medications - No data to display   Procedures  /  Critical Care Procedures  ED Course and Medical Decision Making  I have reviewed the triage vital signs, the nursing notes, and pertinent available records from the EMR.  Social Determinants Affecting Complexity of Care: Patient has no clinically significant social determinants affecting this chief complaint..   ED Course:   Patient here with SOB and CP x 3 days.  Top differential diagnoses include fluid overload, ACS, CHF. Medical Decision Making Problems  Addressed: Shortness of breath: acute illness or injury  Amount and/or Complexity of Data Reviewed Labs: ordered.    Details: Troponin is 98, this is about baseline for patient, potassium is 4.2, BNP is elevated as expected Radiology: ordered.    Details: No large pleural effusion or edema     Consultants: No consultations were needed in caring for this patient.   Treatment and Plan: I considered admission due to patient's initial presentation, but after considering the examination and diagnostic results, patient will not require admission and can be discharged with outpatient follow-up.  Patient discussed with attending physician, Dr. Leonette Monarch, who has  reviewed labs and imaging with me.  Agrees with plan for discharge so the patient can go to dialysis..  Final Clinical Impressions(s) / ED Diagnoses     ICD-10-CM   1. Shortness of breath  R06.02       ED Discharge Orders     None         Discharge Instructions Discussed with and Provided to Patient:     Discharge Instructions      Go to your morning dialysis.  No pneumonia was seen.  Your heart marker and blood counts were at your baseline.       Montine Circle, PA-C 04/28/22 0554    Fatima Blank, MD 04/30/22 [phone]

## 2022-04-28 NOTE — ED Triage Notes (Signed)
Pt states that he has been having SOB and CP since Friday, pt is a dialysis pt goes MWF, has not missed any treatments, pt speaks in short sentences and wheezing.

## 2022-04-30 DIAGNOSIS — N186 End stage renal disease: Secondary | ICD-10-CM | POA: Diagnosis not present

## 2022-04-30 DIAGNOSIS — D631 Anemia in chronic kidney disease: Secondary | ICD-10-CM | POA: Diagnosis not present

## 2022-04-30 DIAGNOSIS — E876 Hypokalemia: Secondary | ICD-10-CM | POA: Diagnosis not present

## 2022-04-30 DIAGNOSIS — Z992 Dependence on renal dialysis: Secondary | ICD-10-CM | POA: Diagnosis not present

## 2022-04-30 DIAGNOSIS — D509 Iron deficiency anemia, unspecified: Secondary | ICD-10-CM | POA: Diagnosis not present

## 2022-04-30 DIAGNOSIS — N2581 Secondary hyperparathyroidism of renal origin: Secondary | ICD-10-CM | POA: Diagnosis not present

## 2022-05-01 ENCOUNTER — Other Ambulatory Visit: Payer: Self-pay | Admitting: Internal Medicine

## 2022-05-02 DIAGNOSIS — N186 End stage renal disease: Secondary | ICD-10-CM | POA: Diagnosis not present

## 2022-05-02 DIAGNOSIS — Z992 Dependence on renal dialysis: Secondary | ICD-10-CM | POA: Diagnosis not present

## 2022-05-02 DIAGNOSIS — N2581 Secondary hyperparathyroidism of renal origin: Secondary | ICD-10-CM | POA: Diagnosis not present

## 2022-05-02 DIAGNOSIS — E876 Hypokalemia: Secondary | ICD-10-CM | POA: Diagnosis not present

## 2022-05-02 DIAGNOSIS — D631 Anemia in chronic kidney disease: Secondary | ICD-10-CM | POA: Diagnosis not present

## 2022-05-02 DIAGNOSIS — D509 Iron deficiency anemia, unspecified: Secondary | ICD-10-CM | POA: Diagnosis not present

## 2022-05-05 DIAGNOSIS — D509 Iron deficiency anemia, unspecified: Secondary | ICD-10-CM | POA: Diagnosis not present

## 2022-05-05 DIAGNOSIS — N186 End stage renal disease: Secondary | ICD-10-CM | POA: Diagnosis not present

## 2022-05-05 DIAGNOSIS — D631 Anemia in chronic kidney disease: Secondary | ICD-10-CM | POA: Diagnosis not present

## 2022-05-05 DIAGNOSIS — Z992 Dependence on renal dialysis: Secondary | ICD-10-CM | POA: Diagnosis not present

## 2022-05-05 DIAGNOSIS — E876 Hypokalemia: Secondary | ICD-10-CM | POA: Diagnosis not present

## 2022-05-05 DIAGNOSIS — N2581 Secondary hyperparathyroidism of renal origin: Secondary | ICD-10-CM | POA: Diagnosis not present

## 2022-05-07 DIAGNOSIS — D509 Iron deficiency anemia, unspecified: Secondary | ICD-10-CM | POA: Diagnosis not present

## 2022-05-07 DIAGNOSIS — D631 Anemia in chronic kidney disease: Secondary | ICD-10-CM | POA: Diagnosis not present

## 2022-05-07 DIAGNOSIS — N186 End stage renal disease: Secondary | ICD-10-CM | POA: Diagnosis not present

## 2022-05-07 DIAGNOSIS — Z992 Dependence on renal dialysis: Secondary | ICD-10-CM | POA: Diagnosis not present

## 2022-05-07 DIAGNOSIS — E876 Hypokalemia: Secondary | ICD-10-CM | POA: Diagnosis not present

## 2022-05-07 DIAGNOSIS — N2581 Secondary hyperparathyroidism of renal origin: Secondary | ICD-10-CM | POA: Diagnosis not present

## 2022-05-08 ENCOUNTER — Other Ambulatory Visit: Payer: Self-pay | Admitting: Internal Medicine

## 2022-05-09 DIAGNOSIS — N186 End stage renal disease: Secondary | ICD-10-CM | POA: Diagnosis not present

## 2022-05-09 DIAGNOSIS — D631 Anemia in chronic kidney disease: Secondary | ICD-10-CM | POA: Diagnosis not present

## 2022-05-09 DIAGNOSIS — N2581 Secondary hyperparathyroidism of renal origin: Secondary | ICD-10-CM | POA: Diagnosis not present

## 2022-05-09 DIAGNOSIS — E876 Hypokalemia: Secondary | ICD-10-CM | POA: Diagnosis not present

## 2022-05-09 DIAGNOSIS — D509 Iron deficiency anemia, unspecified: Secondary | ICD-10-CM | POA: Diagnosis not present

## 2022-05-09 DIAGNOSIS — Z992 Dependence on renal dialysis: Secondary | ICD-10-CM | POA: Diagnosis not present

## 2022-05-12 DIAGNOSIS — D509 Iron deficiency anemia, unspecified: Secondary | ICD-10-CM | POA: Diagnosis not present

## 2022-05-12 DIAGNOSIS — N186 End stage renal disease: Secondary | ICD-10-CM | POA: Diagnosis not present

## 2022-05-12 DIAGNOSIS — E876 Hypokalemia: Secondary | ICD-10-CM | POA: Diagnosis not present

## 2022-05-12 DIAGNOSIS — Z992 Dependence on renal dialysis: Secondary | ICD-10-CM | POA: Diagnosis not present

## 2022-05-12 DIAGNOSIS — N2581 Secondary hyperparathyroidism of renal origin: Secondary | ICD-10-CM | POA: Diagnosis not present

## 2022-05-12 DIAGNOSIS — D631 Anemia in chronic kidney disease: Secondary | ICD-10-CM | POA: Diagnosis not present

## 2022-05-13 ENCOUNTER — Encounter: Payer: Self-pay | Admitting: Allergy & Immunology

## 2022-05-13 ENCOUNTER — Ambulatory Visit (INDEPENDENT_AMBULATORY_CARE_PROVIDER_SITE_OTHER): Payer: Medicare Other | Admitting: Allergy & Immunology

## 2022-05-13 VITALS — BP 120/80 | HR 88 | Temp 97.9°F | Resp 16 | Wt 194.6 lb

## 2022-05-13 DIAGNOSIS — J3089 Other allergic rhinitis: Secondary | ICD-10-CM | POA: Diagnosis not present

## 2022-05-13 DIAGNOSIS — R432 Parageusia: Secondary | ICD-10-CM

## 2022-05-13 DIAGNOSIS — H1013 Acute atopic conjunctivitis, bilateral: Secondary | ICD-10-CM | POA: Diagnosis not present

## 2022-05-13 DIAGNOSIS — J4530 Mild persistent asthma, uncomplicated: Secondary | ICD-10-CM

## 2022-05-13 MED ORDER — AZELASTINE HCL 0.1 % NA SOLN
2.0000 | Freq: Two times a day (BID) | NASAL | 5 refills | Status: DC | PRN
Start: 2022-05-13 — End: 2022-11-13

## 2022-05-13 MED ORDER — DESLORATADINE 5 MG PO TABS: 5.0000 mg | ORAL_TABLET | Freq: Every day | ORAL | 5 refills | Status: DC

## 2022-05-13 MED ORDER — IPRATROPIUM BROMIDE 0.06 % NA SOLN: NASAL | 5 refills | Status: DC
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
## 2022-05-13 NOTE — Patient Instructions (Addendum)
1. Mild persistent asthma, uncomplicated - Lung testing looked stable.  - Daily controller medication(s): Symbicort 160/4.72mg two puffs once daily with spacer - Prior to physical activity: albuterol 2 puffs 10-15 minutes before physical activity. - Rescue medications: albuterol 4 puffs every 4-6 hours as needed - Changes during respiratory infections or worsening symptoms: Add on Flovent 1123m to 2 puffs twice daily for TWO WEEKS. - Asthma control goals:  * Full participation in all desired activities (may need albuterol before activity) * Albuterol use two time or less a week on average (not counting use with activity) * Cough interfering with sleep two time or less a month * Oral steroids no more than once a year * No hospitalizations  2. Perennial allergic conjunctivitis of both eyes - Continue with Pataday one drop per eye twice daily. - Continue with Systane eye drops as needed. - Continue with azelastine as needed. - Continue with ipratropium as needed.  - Add on Clarinex 5 mg daily to help with postnasal drip (this is predominantly cleared by the liver).   3. Return in about 6 months (around 11/13/2022).    Please inform usKoreaf any Emergency Department visits, hospitalizations, or changes in symptoms. Call usKoreaefore going to the ED for breathing or allergy symptoms since we might be able to fit you in for a sick visit. Feel free to contact usKoreanytime with any questions, problems, or concerns.  It was a pleasure to see you again today!  Websites that have reliable patient information: 1. American Academy of Asthma, Allergy, and Immunology: www.aaaai.org 2. Food Allergy Research and Education (FARE): foodallergy.org 3. Mothers of Asthmatics: http://www.asthmacommunitynetwork.org 4. American College of Allergy, Asthma, and Immunology: www.acaai.org   COVID-19 Vaccine Information can be found at: [email or web address]  questions related to vaccine distribution or appointments, please email [email or web address] or call [phone]    "Like" usKorean Facebook and Instagram for our latest updates!       Make sure you are registered to vote! If you have moved or changed any of your contact information, you will need to get this updated before voting!  In some cases, you MAY be able to register to vote online: [email or web address]

## 2022-05-14 DIAGNOSIS — E876 Hypokalemia: Secondary | ICD-10-CM | POA: Diagnosis not present

## 2022-05-14 DIAGNOSIS — D509 Iron deficiency anemia, unspecified: Secondary | ICD-10-CM | POA: Diagnosis not present

## 2022-05-14 DIAGNOSIS — Z992 Dependence on renal dialysis: Secondary | ICD-10-CM | POA: Diagnosis not present

## 2022-05-14 DIAGNOSIS — N186 End stage renal disease: Secondary | ICD-10-CM | POA: Diagnosis not present

## 2022-05-14 DIAGNOSIS — N2581 Secondary hyperparathyroidism of renal origin: Secondary | ICD-10-CM | POA: Diagnosis not present

## 2022-05-14 DIAGNOSIS — D631 Anemia in chronic kidney disease: Secondary | ICD-10-CM | POA: Diagnosis not present

## 2022-05-16 DIAGNOSIS — N2581 Secondary hyperparathyroidism of renal origin: Secondary | ICD-10-CM | POA: Diagnosis not present

## 2022-05-16 DIAGNOSIS — D631 Anemia in chronic kidney disease: Secondary | ICD-10-CM | POA: Diagnosis not present

## 2022-05-16 DIAGNOSIS — E876 Hypokalemia: Secondary | ICD-10-CM | POA: Diagnosis not present

## 2022-05-16 DIAGNOSIS — Z992 Dependence on renal dialysis: Secondary | ICD-10-CM | POA: Diagnosis not present

## 2022-05-16 DIAGNOSIS — N186 End stage renal disease: Secondary | ICD-10-CM | POA: Diagnosis not present

## 2022-05-16 DIAGNOSIS — D509 Iron deficiency anemia, unspecified: Secondary | ICD-10-CM | POA: Diagnosis not present

## 2022-05-19 DIAGNOSIS — D509 Iron deficiency anemia, unspecified: Secondary | ICD-10-CM | POA: Diagnosis not present

## 2022-05-19 DIAGNOSIS — N2581 Secondary hyperparathyroidism of renal origin: Secondary | ICD-10-CM | POA: Diagnosis not present

## 2022-05-19 DIAGNOSIS — E876 Hypokalemia: Secondary | ICD-10-CM | POA: Diagnosis not present

## 2022-05-19 DIAGNOSIS — D631 Anemia in chronic kidney disease: Secondary | ICD-10-CM | POA: Diagnosis not present

## 2022-05-19 DIAGNOSIS — N186 End stage renal disease: Secondary | ICD-10-CM | POA: Diagnosis not present

## 2022-05-19 DIAGNOSIS — Z992 Dependence on renal dialysis: Secondary | ICD-10-CM | POA: Diagnosis not present

## 2022-05-21 DIAGNOSIS — E876 Hypokalemia: Secondary | ICD-10-CM | POA: Diagnosis not present

## 2022-05-21 DIAGNOSIS — N2581 Secondary hyperparathyroidism of renal origin: Secondary | ICD-10-CM | POA: Diagnosis not present

## 2022-05-21 DIAGNOSIS — N186 End stage renal disease: Secondary | ICD-10-CM | POA: Diagnosis not present

## 2022-05-21 DIAGNOSIS — D631 Anemia in chronic kidney disease: Secondary | ICD-10-CM | POA: Diagnosis not present

## 2022-05-21 DIAGNOSIS — Z992 Dependence on renal dialysis: Secondary | ICD-10-CM | POA: Diagnosis not present

## 2022-05-21 DIAGNOSIS — D509 Iron deficiency anemia, unspecified: Secondary | ICD-10-CM | POA: Diagnosis not present

## 2022-05-23 DIAGNOSIS — D509 Iron deficiency anemia, unspecified: Secondary | ICD-10-CM | POA: Diagnosis not present

## 2022-05-23 DIAGNOSIS — N2581 Secondary hyperparathyroidism of renal origin: Secondary | ICD-10-CM | POA: Diagnosis not present

## 2022-05-23 DIAGNOSIS — E876 Hypokalemia: Secondary | ICD-10-CM | POA: Diagnosis not present

## 2022-05-23 DIAGNOSIS — N186 End stage renal disease: Secondary | ICD-10-CM | POA: Diagnosis not present

## 2022-05-23 DIAGNOSIS — D631 Anemia in chronic kidney disease: Secondary | ICD-10-CM | POA: Diagnosis not present

## 2022-05-23 DIAGNOSIS — Z992 Dependence on renal dialysis: Secondary | ICD-10-CM | POA: Diagnosis not present

## 2022-05-26 DIAGNOSIS — I129 Hypertensive chronic kidney disease with stage 1 through stage 4 chronic kidney disease, or unspecified chronic kidney disease: Secondary | ICD-10-CM | POA: Diagnosis not present

## 2022-05-26 DIAGNOSIS — N186 End stage renal disease: Secondary | ICD-10-CM | POA: Diagnosis not present

## 2022-05-26 DIAGNOSIS — E876 Hypokalemia: Secondary | ICD-10-CM | POA: Diagnosis not present

## 2022-05-26 DIAGNOSIS — D509 Iron deficiency anemia, unspecified: Secondary | ICD-10-CM | POA: Diagnosis not present

## 2022-05-26 DIAGNOSIS — N2581 Secondary hyperparathyroidism of renal origin: Secondary | ICD-10-CM | POA: Diagnosis not present

## 2022-05-26 DIAGNOSIS — D631 Anemia in chronic kidney disease: Secondary | ICD-10-CM | POA: Diagnosis not present

## 2022-05-26 DIAGNOSIS — Z992 Dependence on renal dialysis: Secondary | ICD-10-CM | POA: Diagnosis not present

## 2022-05-27 ENCOUNTER — Encounter: Payer: Self-pay | Admitting: Internal Medicine

## 2022-05-27 ENCOUNTER — Other Ambulatory Visit: Payer: Medicare Other

## 2022-05-27 ENCOUNTER — Ambulatory Visit: Payer: Medicare Other

## 2022-05-28 ENCOUNTER — Ambulatory Visit (HOSPITAL_COMMUNITY): Payer: Medicare Other | Attending: Internal Medicine

## 2022-05-28 ENCOUNTER — Other Ambulatory Visit: Payer: Self-pay

## 2022-05-28 ENCOUNTER — Ambulatory Visit (INDEPENDENT_AMBULATORY_CARE_PROVIDER_SITE_OTHER): Payer: Medicare Other | Admitting: Cardiovascular Disease

## 2022-05-28 ENCOUNTER — Encounter: Payer: Self-pay | Admitting: Cardiovascular Disease

## 2022-05-28 VITALS — BP 128/90 | HR 76 | Ht 70.0 in | Wt 196.2 lb

## 2022-05-28 DIAGNOSIS — I10 Essential (primary) hypertension: Secondary | ICD-10-CM | POA: Diagnosis not present

## 2022-05-28 DIAGNOSIS — Z01810 Encounter for preprocedural cardiovascular examination: Secondary | ICD-10-CM

## 2022-05-28 DIAGNOSIS — I340 Nonrheumatic mitral (valve) insufficiency: Secondary | ICD-10-CM

## 2022-05-28 DIAGNOSIS — I471 Supraventricular tachycardia: Secondary | ICD-10-CM | POA: Diagnosis not present

## 2022-05-28 DIAGNOSIS — I2510 Atherosclerotic heart disease of native coronary artery without angina pectoris: Secondary | ICD-10-CM | POA: Diagnosis not present

## 2022-05-28 DIAGNOSIS — N186 End stage renal disease: Secondary | ICD-10-CM

## 2022-05-28 LAB — CBC
Hematocrit: 32.9 % — ABNORMAL LOW (ref 37.5–51.0)
Hemoglobin: 11.1 g/dL — ABNORMAL LOW (ref 13.0–17.7)
MCH: 30.1 pg (ref 26.6–33.0)
MCHC: 33.7 g/dL (ref 31.5–35.7)
MCV: 89 fL (ref 79–97)
Platelets: 212 10*3/uL (ref 150–450)
RBC: 3.69 x10E6/uL — ABNORMAL LOW (ref 4.14–5.80)
RDW: 16.5 % — ABNORMAL HIGH (ref 11.6–15.4)
WBC: 6.6 10*3/uL (ref 3.4–10.8)

## 2022-05-28 LAB — BASIC METABOLIC PANEL
BUN/Creatinine Ratio: 5 — ABNORMAL LOW (ref 9–20)
BUN: 59 mg/dL — ABNORMAL HIGH (ref 6–24)
CO2: 21 mmol/L (ref 20–29)
Calcium: 10 mg/dL (ref 8.7–10.2)
Chloride: 103 mmol/L (ref 96–106)
Creatinine, Ser: 12.42 mg/dL — ABNORMAL HIGH (ref 0.76–1.27)
Glucose: 85 mg/dL (ref 70–99)
Potassium: 4.5 mmol/L (ref 3.5–5.2)
Sodium: 138 mmol/L (ref 134–144)
eGFR: 4 mL/min/{1.73_m2} — ABNORMAL LOW (ref >59)

## 2022-05-28 LAB — ECHOCARDIOGRAM COMPLETE
Area-P 1/2: 5.75 cm2
MV M vel: 5.42 m/s
MV Peak grad: 117.5 mmHg
P 1/2 time: 709 msec
Radius: 0.85 cm
S' Lateral: 4.2 cm

## 2022-05-28 NOTE — Patient Instructions (Signed)
Medication Instructions:  Your physician recommends that you continue on your current medications as directed. Please refer to the Current Medication list given to you today.  *If you need a refill on your cardiac medications before your next appointment, please call your pharmacy*  Lab Work: CBC, BMET If you have labs (blood work) drawn today and your tests are completely normal, you will receive your results only by: [place] (if you have MyChart) OR A paper copy in the mail If you have any lab test that is abnormal or we need to change your treatment, we will call you to review the results.  Testing/Procedures: Transesophageal Echocardiogram Your physician has requested that you have a TEE. During a TEE, sound waves are used to create images of your heart. It provides your doctor with information about the size and shape of your heart and how well your heart's chambers and valves are working. In this test, a transducer is attached to the end of a flexible tube that's guided down your throat and into your esophagus (the tube leading from you mouth to your stomach) to get a more detailed image of your heart. You are not awake for the procedure. Please see the instruction sheet given to you today. For further information please visit [email or web address].  Follow-Up: At [facility], you and your health needs are our priority.  As part of our continuing mission to provide you with exceptional heart care, we have created designated Provider Care Teams.  These Care Teams include your primary Cardiologist (physician) and Advanced Practice Providers (APPs -  Physician Assistants and Nurse Practitioners) who all work together to provide you with the care you need, when you need it.  Your next appointment:   4 week(s)  The format for your next appointment:   In Person  Provider:   [name], MD    Other Instructions  You are scheduled for a TEE on Tuesday, June 03, 2022 with Dr.  Margaretann Loveless.  Please arrive at the [facility] (Main Entrance A) at [facility]: [address] [place], [place] [address] at 11:15 am. (1 hour prior to procedure)  DIET: Nothing to eat or drink after midnight except a sip of water with medications (see medication instructions below)  FYI: For your safety, and to allow [place] to monitor your vital signs accurately during the surgery/procedure we request that   if you have artificial nails, gel coating, SNS etc. Please have those removed prior to your surgery/procedure. Not having the nail coverings /polish removed may result in cancellation or delay of your surgery/procedure.   Medication Instructions:  Continue your Aspirin/same as home meds You will need to continue your anticoagulant after your procedure until you  are told by your  Provider that it is safe to stop   Labs: Today  Bring your insurance cards.  *Special Note: Every effort is made to have your procedure done on time. Occasionally there are emergencies that occur at the hospital that may cause delays. Please be patient if a delay does occur.    Important Information About Sugar

## 2022-05-28 NOTE — H&P (View-Only) (Signed)
Cardiology Office Note:    Date:  05/30/2022   ID:  Albert Eaton, DOB 1966-10-17, MRN [id number]  PCP:  [name], NP   [place] Providers Cardiologist:  [name], MD Electrophysiologist:  Vickie Epley, MD     Referring MD: Bo Merino I, NP   Chief Complaint  Patient presents with   Congestive Heart Failure    History of Present Illness:    Albert Eaton is a 56 y.o. male with a hx of: HFpEF) heart failure with preserved ejection fraction  Mitral regurgitation  Echocardiogram 5/22: EF 60-65, mod to severe MR Eval by Dr. [name] 2/22>>med management  Coronary artery disease  Non-obstructive by Cor CTA in 09/2020  ESRD on hemodialysis Renal Cell CA s/p L nephrectomy 01/2021 ([facility]) Hypertension  Hyperlipidemia  OSA  HIV+ Atrial Tachycardia Eval with EP (Dr. Lurline Hare) in 3/22 >> Conservative mgmt [place] Rx if symptomatic or more significant  The patient presents today for cardiology follow-up.  He is here alone today.  He reports that he remains on the list for kidney transplant.  He feels like he has done better since switching from peritoneal to hemodialysis.  He continues to experience symptoms of exertional dyspnea at times.  He also feels chest pressure and orthopnea sometimes.  There is no clear correlation with his dialysis schedule.  He denies exertional chest pain.  He has had no recent problems with edema, lightheadedness, or syncope.  He has noticed increased heart palpitations and rapid heart rates.  Past Medical History:  Diagnosis Date   Anemia    Aortic atherosclerosis (HCC)    Arthritis    Asthma    Cancer ([place])    renal cyst   Chronic diastolic CHF (congestive heart failure) ([place]) 01/06/2017   Echo 7/16 [facility] in [place], [name]) Mild AI, mild LAE, mild concentric LVH, EF 55, normal wall motion, mild to moderate MR, mild PI, RVSP 55 // Echo 10/09/16 ([facility]):  Moderate LVH, grade 2 diastolic dysfunction, mild  MR, moderate LAE    CKD (chronic kidney disease)    Dialysis Mon Wed Fri   Esophagitis    GERD (gastroesophageal reflux disease)    Gout    no current problems   Heart murmur    never caused any problems   Hepatitis    Hep B   History of nuclear stress test    a. Nuc study 7/16: no scar or ischemia, EF 42 // b. Nuc study 12/17: EF 48, ?small apical ischemia, Low Risk   HIV (human immunodeficiency virus infection) ([place])    HLD (hyperlipidemia)    Hypertension    Hypertensive heart disease with CHF (congestive heart failure) ([employer]) 10/09/2016   Hypothyroidism    Mitral regurgitation    OSA (obstructive sleep apnea)    Post-operative nausea and vomiting    1 time as a child, no problems as an adult   Sleep apnea    uses c-pap   Thyroid disease    Wears glasses    Wears partial dentures     Past Surgical History:  Procedure Laterality Date   AV FISTULA PLACEMENT Left 07/02/2018   Procedure: Creation of Left arm BRACHIOBASILIC ARTERIOVENOUS  FISTULA;  Surgeon: Marty Heck, MD;  Location: [facility];  Service: Vascular;  Laterality: Left;   [place] Left 08/27/2018   Procedure: Left arm BRACHIOBASILIC VEIN TRANSPOSITION SECOND STAGE;  Surgeon: Marty Heck, MD;  Location: [facility];  Service:  Vascular;  Laterality: Left;   BUBBLE STUDY  09/03/2020   Procedure: BUBBLE STUDY;  Surgeon: Fay Records, MD;  Location: [facility];  Service: Cardiovascular;;   CAPD INSERTION N/A 05/30/2021   Procedure: LAPAROSCOPIC INSERTION CONTINUOUS AMBULATORY PERITONEAL DIALYSIS  (CAPD) CATHETER;  Surgeon: [name], MD;  Location: [facility];  Service: Vascular;  Laterality: N/A;   CAPD REMOVAL N/A 02/13/2022   Procedure: PERITONEAL DIALYSIS CATHETER REMOVAL;  Surgeon: [name], MD;  Location: [facility];  Service: Vascular;  Laterality: N/A;   CHOLECYSTECTOMY     COLONOSCOPY W/ BIOPSIES AND POLYPECTOMY     GIVENS CAPSULE STUDY N/A 03/01/2018   Procedure: GIVENS  CAPSULE STUDY;  Surgeon: Jerene Bears, MD;  Location: [facility];  Service: Gastroenterology;  Laterality: N/A;   HERNIA REPAIR     As baby   MULTIPLE TOOTH EXTRACTIONS     NEPHRECTOMY Left 02/18/2021   NOSE SURGERY     RENAL BIOPSY     RIGHT/LEFT HEART CATH AND CORONARY ANGIOGRAPHY N/A 09/04/2021   Procedure: RIGHT/LEFT HEART CATH AND CORONARY ANGIOGRAPHY;  Surgeon: Burnell Blanks, MD;  Location: [facility] [facility] CV LAB;  Service: Cardiovascular;  Laterality: N/A;   TEE WITHOUT CARDIOVERSION N/A 09/03/2020   Procedure: TRANSESOPHAGEAL ECHOCARDIOGRAM (TEE);  Surgeon: Fay Records, MD;  Location: [facility] ENDOSCOPY;  Service: Cardiovascular;  Laterality: N/A;   UPPER GI ENDOSCOPY  04/05/2021    Current Medications: Current Meds  Medication Sig   ACETAMINOPHEN EXTRA STRENGTH 500 MG tablet Take 1,000 mg by mouth every 6 (six) hours as needed (pain.).   allopurinol (ZYLOPRIM) 100 MG tablet TAKE 1 TABLET(100 MG) BY MOUTH TWICE DAILY FOR 60 DOSES   amLODipine (NORVASC) 10 MG tablet Take 1 tablet (10 mg total) by mouth daily.   aspirin EC 81 MG tablet Take 1 tablet (81 mg total) by mouth daily. Swallow whole.   azelastine (ASTELIN) 0.1 % nasal spray Place 2 sprays into both nostrils 2 (two) times daily as needed for allergies.   BIKTARVY 50-200-25 MG TABS tablet Take 1 tablet by mouth daily.   budesonide-formoterol (SYMBICORT) 160-4.5 MCG/ACT inhaler Inhale 2 puffs into the lungs daily.   chlorproMAZINE (THORAZINE) 10 MG tablet Take 1 tablet (10 mg total) by mouth 3 (three) times daily as needed for hiccoughs.   cinacalcet (SENSIPAR) 30 MG tablet Take 60 mg by mouth every evening.   clobetasol (OLUX) 0.05 % topical foam Apply topically 2 (two) times daily.   desloratadine (CLARINEX) 5 MG tablet Take 1 tablet (5 mg total) by mouth daily.   famotidine (PEPCID) 20 MG tablet Take 1 tablet (20 mg total) by mouth 2 (two) times daily.   ferric citrate (AURYXIA) 1 GM 210 MG(Fe) tablet Take 420 mg by  mouth 2 (two) times daily with a meal.   gabapentin (NEURONTIN) 300 MG capsule TAKE 1 CAPSULE(300 MG) BY MOUTH THREE TIMES DAILY   hydrALAZINE (APRESOLINE) 100 MG tablet Take 100 mg by mouth 3 (three) times daily.   hyoscyamine (LEVSIN SL) 0.125 MG SL tablet Place 1-2 tablets (0.125-0.25 mg total) under the tongue every 8 (eight) hours as needed (lower abdominal pain, spasm).   ipratropium (ATROVENT) 0.06 % nasal spray USE 2 SPRAYS IN EACH NOSTRIL 2 TO 3 TIMES DAILY AS NEEDED   lactulose (CHRONULAC) 10 GM/15ML solution Take 20 g by mouth daily as needed for moderate constipation.    levalbuterol (XOPENEX HFA) 45 MCG/ACT inhaler Inhale 2 puffs into the lungs every 4 (four) hours  as needed for wheezing.   levothyroxine (SYNTHROID) 25 MCG tablet Take by mouth.   lubiprostone (AMITIZA) 24 MCG capsule Take 1 capsule (24 mcg total) by mouth 2 (two) times daily with a meal. NEEDS OFFICE VISIT FOR FURTHER REFILLS   Methoxy PEG-Epoetin Beta (MIRCERA IJ) Inject 1 each into the skin every 30 (thirty) days. Administered at dialysis center once a month   metoprolol tartrate (LOPRESSOR) 25 MG tablet Take 25 mg by mouth 2 (two) times daily.   multivitamin (RENA-VIT) TABS tablet Take 1 tablet by mouth at bedtime.   OLOPATADINE HCL OP Place 1 drop into both eyes daily as needed (allergies).   pantoprazole (PROTONIX) 40 MG tablet Take 1 tablet (40 mg total) by mouth daily. **PLEASE CONTACT THE OFFICE TO SCHEDULE FOLLOW UP   rOPINIRole (REQUIP) 1 MG tablet TAKE ONE TABLET BY MOUTH AT BEDTIME   rosuvastatin (CRESTOR) 10 MG tablet TAKE ONE TABLET BY MOUTH DAILY   Spacer/Aero-Holding Chambers DEVI 1 Device by Does not apply route 2 (two) times a day.     Allergies:   Ace inhibitors   Social History   Socioeconomic History   Marital status: Single    Spouse name: Not on file   Number of children: Not on file   Years of education: Not on file   Highest education level: Not on file  Occupational History   Not  on file  Tobacco Use   Smoking status: Former    Packs/day: 1.00    Years: 18.00    Total pack years: 18.00    Types: Cigarettes    Quit date: 2000    Years since quitting: 23.6   Smokeless tobacco: Never  Vaping Use   Vaping Use: Never used  Substance and Sexual Activity   Alcohol use: Not Currently   Drug use: No   Sexual activity: Not Currently    Partners: Male  Other Topics Concern   Not on file  Social History Narrative   Disability      [place]: none   Social Determinants of Health   Financial Resource Strain: Not on file  Food Insecurity: No Food Insecurity (09/04/2020)   Hunger Vital Sign    Worried About Running Out of Food in the Last Year: Never true    Ran Out of Food in the Last Year: Never true  Transportation Needs: No Transportation Needs (09/04/2020)   PRAPARE - Hydrologist (Medical): No    Lack of Transportation (Non-Medical): No  Physical Activity: Not on file  Stress: Not on file  Social Connections: Not on file     Family History: The patient's family history includes Alcohol abuse in his father; Heart attack (age of onset: 48) in his maternal grandmother; Heart failure in his mother; Hypertension in his brother, father, mother, sister, and sister; Multiple sclerosis in his sister; Scoliosis in an other family member. There is no history of Allergic rhinitis, Angioedema, Asthma, Eczema, Immunodeficiency, Urticaria, Colon cancer, Pancreatic cancer, or Esophageal cancer.  ROS:   Please see the history of present illness.    All other systems reviewed and are negative.  EKGs/Labs/Other Studies Reviewed:    The following studies were reviewed today: 2D echocardiogram: 1. End systolic diameter is 42 mm. Compared to echo images from Feb 2023,  LVEF appears greater. . Left ventricular ejection fraction, by estimation,  is 55 to 60%. The left ventricle has normal function. The left ventricle  has no regional wall motion  abnormalities. The left ventricular internal cavity size was mildly to  moderately dilated. There is mild left ventricular hypertrophy. Left  ventricular diastolic parameters are indeterminate.   2. Right ventricular systolic function is low normal. The right  ventricular size is normal.   3. Left atrial size was moderately dilated.   4. Right atrial size was mildly dilated.   5. MR is eccentric, directed posterior into LA. Appears moderate-severe.  PISA is 0.9 cm. Rergurgitant volume calculated at 49 ml. Compared to echo  images from Feb 2023, LA is larger, E/A ratios increased. . Moderate to  severe mitral valve regurgitation.   6. TR is eccentric, directed toward the interatrial septum.. Tricuspid  valve regurgitation is mild to moderate.   7. The aortic valve is tricuspid. Aortic valve regurgitation is mild.   Cardiac catheterization 09/04/2021: Diagnostic Dominance: Left Left Anterior Descending  Vessel is large.  Prox LAD lesion is 40% stenosed.  Mid LAD lesion is 50% stenosed.    Left Circumflex  Vessel is large.    Second Obtuse Marginal Branch  2nd Mrg lesion is 30% stenosed.    Right Coronary Artery  Vessel is moderate in size.  Prox RCA lesion is 50% stenosed.    Intervention   No interventions have been documented.   Coronary Diagrams  Diagnostic Dominance: Left   Recent Labs: 01/21/2022: TSH 3.49 02/09/2022: Magnesium 1.8 02/20/2022: NT-Pro BNP 22,536 04/17/2022: ALT 9 04/28/2022: B Natriuretic Peptide 1,809.5 05/28/2022: BUN 59; Creatinine, Ser 12.42; Hemoglobin 11.1; Platelets 212; Potassium 4.5; Sodium 138  Recent Lipid Panel    Component Value Date/Time   CHOL 113 04/17/2022 0345   CHOL 126 05/01/2021 1055   TRIG 131 04/17/2022 0345   HDL 35 (L) 04/17/2022 0345   HDL 40 05/01/2021 1055   CHOLHDL 3.2 04/17/2022 0345   VLDL 64 (H) 09/04/2021 0347   LDLCALC 57 04/17/2022 0345                Physical Exam:    VS:  BP (!) 128/90   Pulse 76    Ht 5' 10"  (1.778 m)   Wt 196 lb 3.2 oz (89 kg)   SpO2 98%   BMI 28.15 kg/m     Wt Readings from Last 3 Encounters:  05/28/22 196 lb 3.2 oz (89 kg)  05/13/22 194 lb 9.6 oz (88.3 kg)  04/17/22 198 lb (89.8 kg)     GEN:  Well nourished, well developed in no acute distress HEENT: Normal NECK: No JVD; No carotid bruits LYMPHATICS: No lymphadenopathy CARDIAC: RRR, 3/6 holosystolic murmur at the left axilla RESPIRATORY:  Clear to auscultation without rales, wheezing or rhonchi  ABDOMEN: Soft, non-tender, non-distended MUSCULOSKELETAL:  No edema; No deformity  SKIN: Warm and dry NEUROLOGIC:  Alert and oriented x 3 PSYCHIATRIC:  Normal affect   ASSESSMENT:    1. Severe mitral regurgitation   2. ESRD (end stage renal disease) ([place])   3. Essential hypertension   4. Coronary artery disease involving native coronary artery of native heart without angina pectoris   5. SVT (supraventricular tachycardia) (HCC)    PLAN:    In order of problems listed above:  The patient appears to have severe mitral regurgitation.  I personally reviewed his echo images this morning and compared his echo images side-by-side to his previous study.  His mitral regurgitation appears significantly worse than the previous study.  He also has worsening tricuspid regurgitation, quantified as mild on today's study but I think it looks significantly  worse than that.  The dynamic nature of his mitral regurgitation over time is suggestive of secondary mitral regurgitation probably related to posterior leaflet restriction and annular dilatation.  The patient has symptoms of exertional dyspnea and orthopnea with occasional chest pressure.  He had a recent heart catheterization last year demonstrating nonobstructive CAD.  I suspect his symptoms are related to valvular heart disease and chronic diastolic heart failure.  We discussed potential treatment options that include transcatheter therapies such as transcatheter  edge-to-edge repair of the mitral valve as well as conventional surgical therapies that could address both his mitral and tricuspid valve disease.  The patient's comorbid conditions are significant and include end-stage renal disease.  However he is functionally independent and being considered for renal transplantation.  I think it is best to repeat a transesophageal echocardiogram to better assess the functional anatomy of his mitral and tricuspid valves and better define his treatment options.  I have reviewed the risks, indications, and alternatives to transesophageal echo with the patient who understands and agrees to proceed.  Once his TEE is completed, I will review his case with our multidisciplinary heart valve team. Appears stable Controlled on medical therapy and hemodialysis Recent cardiac catheterization data reviewed with mild to moderate nonobstructive CAD noted Felt to be atrial tachycardia, previously evaluated by Dr. Quentin Ore, treated medically with beta-blockade.      Shared Decision Making/Informed Consent The risks [esophageal damage, perforation (1:10,000 risk), bleeding, pharyngeal hematoma as well as other potential complications associated with conscious sedation including aspiration, arrhythmia, respiratory failure and death], benefits (treatment guidance and diagnostic support) and alternatives of a transesophageal echocardiogram were discussed in detail with Albert Eaton and he is willing to proceed.     Medication Adjustments/Labs and Tests Ordered: Current medicines are reviewed at length with the patient today.  Concerns regarding medicines are outlined above.  Orders Placed This Encounter  Procedures   CBC   Basic metabolic panel   No orders of the defined types were placed in this encounter.   Patient Instructions  Medication Instructions:  Your physician recommends that you continue on your current medications as directed. Please refer to the Current Medication  list given to you today.  *If you need a refill on your cardiac medications before your next appointment, please call your pharmacy*  Lab Work: CBC, BMET If you have labs (blood work) drawn today and your tests are completely normal, you will receive your results only by: [place] (if you have MyChart) OR A paper copy in the mail If you have any lab test that is abnormal or we need to change your treatment, we will call you to review the results.  Testing/Procedures: Transesophageal Echocardiogram Your physician has requested that you have a TEE. During a TEE, sound waves are used to create images of your heart. It provides your doctor with information about the size and shape of your heart and how well your heart's chambers and valves are working. In this test, a transducer is attached to the end of a flexible tube that's guided down your throat and into your esophagus (the tube leading from you mouth to your stomach) to get a more detailed image of your heart. You are not awake for the procedure. Please see the instruction sheet given to you today. For further information please visit [email or web address].  Follow-Up: At [facility], you and your health needs are our priority.  As part of our continuing mission to provide you with exceptional heart care, we  have created designated Provider Care Teams.  These Care Teams include your primary Cardiologist (physician) and Advanced Practice Providers (APPs -  Physician Assistants and Nurse Practitioners) who all work together to provide you with the care you need, when you need it.  Your next appointment:   4 week(s)  The format for your next appointment:   In Person  Provider:   [name], MD    Other Instructions  You are scheduled for a TEE on Tuesday, June 03, 2022 with Dr. Margaretann Loveless.  Please arrive at the [facility] (Main Entrance A) at [facility]: [address] [place], [place] [address] at 11:15 am. (1 hour  prior to procedure)  DIET: Nothing to eat or drink after midnight except a sip of water with medications (see medication instructions below)  FYI: For your safety, and to allow [place] to monitor your vital signs accurately during the surgery/procedure we request that   if you have artificial nails, gel coating, SNS etc. Please have those removed prior to your surgery/procedure. Not having the nail coverings /polish removed may result in cancellation or delay of your surgery/procedure.   Medication Instructions:  Continue your Aspirin/same as home meds You will need to continue your anticoagulant after your procedure until you  are told by your  Provider that it is safe to stop   Labs: Today  Bring your insurance cards.  *Special Note: Every effort is made to have your procedure done on time. Occasionally there are emergencies that occur at the hospital that may cause delays. Please be patient if a delay does occur.    Important Information About Sugar         Signed, [name], MD  05/30/2022 5:08 PM    [place]

## 2022-05-28 NOTE — Progress Notes (Signed)
Cardiology Office Note:    Date:  05/30/2022   ID:  Albert Eaton, DOB 1966/05/31, MRN [id number]  PCP:  [name], NP   [place] Providers Cardiologist:  [name], MD Electrophysiologist:  Vickie Epley, MD     Referring MD: Bo Merino I, NP   Chief Complaint  Patient presents with   Congestive Heart Failure    History of Present Illness:    Albert Eaton is a 56 y.o. male with a hx of: HFpEF) heart failure with preserved ejection fraction  Mitral regurgitation  Echocardiogram 5/22: EF 60-65, mod to severe MR Eval by Dr. [name] 2/22>>med management  Coronary artery disease  Non-obstructive by Cor CTA in 09/2020  ESRD on hemodialysis Renal Cell CA s/p L nephrectomy 01/2021 ([facility]) Hypertension  Hyperlipidemia  OSA  HIV+ Atrial Tachycardia Eval with EP (Dr. Lurline Hare) in 3/22 >> Conservative mgmt [place] Rx if symptomatic or more significant  The patient presents today for cardiology follow-up.  He is here alone today.  He reports that he remains on the list for kidney transplant.  He feels like he has done better since switching from peritoneal to hemodialysis.  He continues to experience symptoms of exertional dyspnea at times.  He also feels chest pressure and orthopnea sometimes.  There is no clear correlation with his dialysis schedule.  He denies exertional chest pain.  He has had no recent problems with edema, lightheadedness, or syncope.  He has noticed increased heart palpitations and rapid heart rates.  Past Medical History:  Diagnosis Date   Anemia    Aortic atherosclerosis (HCC)    Arthritis    Asthma    Cancer ([place])    renal cyst   Chronic diastolic CHF (congestive heart failure) ([place]) 01/06/2017   Echo 7/16 [facility] in [place], [name]) Mild AI, mild LAE, mild concentric LVH, EF 55, normal wall motion, mild to moderate MR, mild PI, RVSP 55 // Echo 10/09/16 ([facility]):  Moderate LVH, grade 2 diastolic dysfunction, mild  MR, moderate LAE    CKD (chronic kidney disease)    Dialysis Mon Wed Fri   Esophagitis    GERD (gastroesophageal reflux disease)    Gout    no current problems   Heart murmur    never caused any problems   Hepatitis    Hep B   History of nuclear stress test    a. Nuc study 7/16: no scar or ischemia, EF 42 // b. Nuc study 12/17: EF 48, ?small apical ischemia, Low Risk   HIV (human immunodeficiency virus infection) ([place])    HLD (hyperlipidemia)    Hypertension    Hypertensive heart disease with CHF (congestive heart failure) ([place]) 10/09/2016   Hypothyroidism    Mitral regurgitation    OSA (obstructive sleep apnea)    Post-operative nausea and vomiting    1 time as a child, no problems as an adult   Sleep apnea    uses c-pap   Thyroid disease    Wears glasses    Wears partial dentures     Past Surgical History:  Procedure Laterality Date   AV FISTULA PLACEMENT Left 07/02/2018   Procedure: Creation of Left arm BRACHIOBASILIC ARTERIOVENOUS  FISTULA;  Surgeon: Marty Heck, MD;  Location: [place];  Service: Vascular;  Laterality: Left;   [place] Left 08/27/2018   Procedure: Left arm BRACHIOBASILIC VEIN TRANSPOSITION SECOND STAGE;  Surgeon: Marty Heck, MD;  Location: [place];  Service:  Vascular;  Laterality: Left;   BUBBLE STUDY  09/03/2020   Procedure: BUBBLE STUDY;  Surgeon: Fay Records, MD;  Location: [place];  Service: Cardiovascular;;   CAPD INSERTION N/A 05/30/2021   Procedure: LAPAROSCOPIC INSERTION CONTINUOUS AMBULATORY PERITONEAL DIALYSIS  (CAPD) CATHETER;  Surgeon: [name], MD;  Location: [place];  Service: Vascular;  Laterality: N/A;   CAPD REMOVAL N/A 02/13/2022   Procedure: PERITONEAL DIALYSIS CATHETER REMOVAL;  Surgeon: [name], MD;  Location[facility];  Service: Vascular;  Laterality: N/A;   CHOLECYSTECTOMY     COLONOSCOPY W/ BIOPSIES AND POLYPECTOMY     GIVENS CAPSULE STUDY N/A 03/01/2018   Procedure: GIVENS  CAPSULE STUDY;  Surgeon: Jerene Bears, MD;  Location: [place];  Service: Gastroenterology;  Laterality: N/A;   HERNIA REPAIR     As baby   MULTIPLE TOOTH EXTRACTIONS     NEPHRECTOMY Left 02/18/2021   NOSE SURGERY     RENAL BIOPSY     RIGHT/LEFT HEART CATH AND CORONARY ANGIOGRAPHY N/A 09/04/2021   Procedure: RIGHT/LEFT HEART CATH AND CORONARY ANGIOGRAPHY;  Surgeon: Burnell Blanks, MD;  Location: [place] CV LAB;  Service: Cardiovascular;  Laterality: N/A;   TEE WITHOUT CARDIOVERSION N/A 09/03/2020   Procedure: TRANSESOPHAGEAL ECHOCARDIOGRAM (TEE);  Surgeon: Fay Records, MD;  Location: [facility] ENDOSCOPY;  Service: Cardiovascular;  Laterality: N/A;   UPPER GI ENDOSCOPY  04/05/2021    Current Medications: Current Meds  Medication Sig   ACETAMINOPHEN EXTRA STRENGTH 500 MG tablet Take 1,000 mg by mouth every 6 (six) hours as needed (pain.).   allopurinol (ZYLOPRIM) 100 MG tablet TAKE 1 TABLET(100 MG) BY MOUTH TWICE DAILY FOR 60 DOSES   amLODipine (NORVASC) 10 MG tablet Take 1 tablet (10 mg total) by mouth daily.   aspirin EC 81 MG tablet Take 1 tablet (81 mg total) by mouth daily. Swallow whole.   azelastine (ASTELIN) 0.1 % nasal spray Place 2 sprays into both nostrils 2 (two) times daily as needed for allergies.   BIKTARVY 50-200-25 MG TABS tablet Take 1 tablet by mouth daily.   budesonide-formoterol (SYMBICORT) 160-4.5 MCG/ACT inhaler Inhale 2 puffs into the lungs daily.   chlorproMAZINE (THORAZINE) 10 MG tablet Take 1 tablet (10 mg total) by mouth 3 (three) times daily as needed for hiccoughs.   cinacalcet (SENSIPAR) 30 MG tablet Take 60 mg by mouth every evening.   clobetasol (OLUX) 0.05 % topical foam Apply topically 2 (two) times daily.   desloratadine (CLARINEX) 5 MG tablet Take 1 tablet (5 mg total) by mouth daily.   famotidine (PEPCID) 20 MG tablet Take 1 tablet (20 mg total) by mouth 2 (two) times daily.   ferric citrate (AURYXIA) 1 GM 210 MG(Fe) tablet Take 420 mg by  mouth 2 (two) times daily with a meal.   gabapentin (NEURONTIN) 300 MG capsule TAKE 1 CAPSULE(300 MG) BY MOUTH THREE TIMES DAILY   hydrALAZINE (APRESOLINE) 100 MG tablet Take 100 mg by mouth 3 (three) times daily.   hyoscyamine (LEVSIN SL) 0.125 MG SL tablet Place 1-2 tablets (0.125-0.25 mg total) under the tongue every 8 (eight) hours as needed (lower abdominal pain, spasm).   ipratropium (ATROVENT) 0.06 % nasal spray USE 2 SPRAYS IN EACH NOSTRIL 2 TO 3 TIMES DAILY AS NEEDED   lactulose (CHRONULAC) 10 GM/15ML solution Take 20 g by mouth daily as needed for moderate constipation.    levalbuterol (XOPENEX HFA) 45 MCG/ACT inhaler Inhale 2 puffs into the lungs every 4 (four) hours  as needed for wheezing.   levothyroxine (SYNTHROID) 25 MCG tablet Take by mouth.   lubiprostone (AMITIZA) 24 MCG capsule Take 1 capsule (24 mcg total) by mouth 2 (two) times daily with a meal. NEEDS OFFICE VISIT FOR FURTHER REFILLS   Methoxy PEG-Epoetin Beta (MIRCERA IJ) Inject 1 each into the skin every 30 (thirty) days. Administered at dialysis center once a month   metoprolol tartrate (LOPRESSOR) 25 MG tablet Take 25 mg by mouth 2 (two) times daily.   multivitamin (RENA-VIT) TABS tablet Take 1 tablet by mouth at bedtime.   OLOPATADINE HCL OP Place 1 drop into both eyes daily as needed (allergies).   pantoprazole (PROTONIX) 40 MG tablet Take 1 tablet (40 mg total) by mouth daily. **PLEASE CONTACT THE OFFICE TO SCHEDULE FOLLOW UP   rOPINIRole (REQUIP) 1 MG tablet TAKE ONE TABLET BY MOUTH AT BEDTIME   rosuvastatin (CRESTOR) 10 MG tablet TAKE ONE TABLET BY MOUTH DAILY   Spacer/Aero-Holding Chambers DEVI 1 Device by Does not apply route 2 (two) times a day.     Allergies:   Ace inhibitors   Social History   Socioeconomic History   Marital status: Single    Spouse name: Not on file   Number of children: Not on file   Years of education: Not on file   Highest education level: Not on file  Occupational History   Not  on file  Tobacco Use   Smoking status: Former    Packs/day: 1.00    Years: 18.00    Total pack years: 18.00    Types: Cigarettes    Quit date: 2000    Years since quitting: 23.6   Smokeless tobacco: Never  Vaping Use   Vaping Use: Never used  Substance and Sexual Activity   Alcohol use: Not Currently   Drug use: No   Sexual activity: Not Currently    Partners: Male  Other Topics Concern   Not on file  Social History Narrative   Disability      [place]: none   Social Determinants of Health   Financial Resource Strain: Not on file  Food Insecurity: No Food Insecurity (09/04/2020)   Hunger Vital Sign    Worried About Running Out of Food in the Last Year: Never true    Ran Out of Food in the Last Year: Never true  Transportation Needs: No Transportation Needs (09/04/2020)   PRAPARE - Hydrologist (Medical): No    Lack of Transportation (Non-Medical): No  Physical Activity: Not on file  Stress: Not on file  Social Connections: Not on file     Family History: The patient's family history includes Alcohol abuse in his father; Heart attack (age of onset: 41) in his maternal grandmother; Heart failure in his mother; Hypertension in his brother, father, mother, sister, and sister; Multiple sclerosis in his sister; Scoliosis in an other family member. There is no history of Allergic rhinitis, Angioedema, Asthma, Eczema, Immunodeficiency, Urticaria, Colon cancer, Pancreatic cancer, or Esophageal cancer.  ROS:   Please see the history of present illness.    All other systems reviewed and are negative.  EKGs/Labs/Other Studies Reviewed:    The following studies were reviewed today: 2D echocardiogram: 1. End systolic diameter is 42 mm. Compared to echo images from Feb 2023,  LVEF appears greater. . Left ventricular ejection fraction, by estimation,  is 55 to 60%. The left ventricle has normal function. The left ventricle  has no regional wall motion  abnormalities. The left ventricular internal cavity size was mildly to  moderately dilated. There is mild left ventricular hypertrophy. Left  ventricular diastolic parameters are indeterminate.   2. Right ventricular systolic function is low normal. The right  ventricular size is normal.   3. Left atrial size was moderately dilated.   4. Right atrial size was mildly dilated.   5. MR is eccentric, directed posterior into LA. Appears moderate-severe.  PISA is 0.9 cm. Rergurgitant volume calculated at 49 ml. Compared to echo  images from Feb 2023, LA is larger, E/A ratios increased. . Moderate to  severe mitral valve regurgitation.   6. TR is eccentric, directed toward the interatrial septum.. Tricuspid  valve regurgitation is mild to moderate.   7. The aortic valve is tricuspid. Aortic valve regurgitation is mild.   Cardiac catheterization 09/04/2021: Diagnostic Dominance: Left Left Anterior Descending  Vessel is large.  Prox LAD lesion is 40% stenosed.  Mid LAD lesion is 50% stenosed.    Left Circumflex  Vessel is large.    Second Obtuse Marginal Branch  2nd Mrg lesion is 30% stenosed.    Right Coronary Artery  Vessel is moderate in size.  Prox RCA lesion is 50% stenosed.    Intervention   No interventions have been documented.   Coronary Diagrams  Diagnostic Dominance: Left   Recent Labs: 01/21/2022: TSH 3.49 02/09/2022: Magnesium 1.8 02/20/2022: NT-Pro BNP 22,536 04/17/2022: ALT 9 04/28/2022: B Natriuretic Peptide 1,809.5 05/28/2022: BUN 59; Creatinine, Ser 12.42; Hemoglobin 11.1; Platelets 212; Potassium 4.5; Sodium 138  Recent Lipid Panel    Component Value Date/Time   CHOL 113 04/17/2022 0345   CHOL 126 05/01/2021 1055   TRIG 131 04/17/2022 0345   HDL 35 (L) 04/17/2022 0345   HDL 40 05/01/2021 1055   CHOLHDL 3.2 04/17/2022 0345   VLDL 64 (H) 09/04/2021 0347   LDLCALC 57 04/17/2022 0345                Physical Exam:    VS:  BP (!) 128/90   Pulse 76    Ht 5' 10"  (1.778 m)   Wt 196 lb 3.2 oz (89 kg)   SpO2 98%   BMI 28.15 kg/m     Wt Readings from Last 3 Encounters:  05/28/22 196 lb 3.2 oz (89 kg)  05/13/22 194 lb 9.6 oz (88.3 kg)  04/17/22 198 lb (89.8 kg)     GEN:  Well nourished, well developed in no acute distress HEENT: Normal NECK: No JVD; No carotid bruits LYMPHATICS: No lymphadenopathy CARDIAC: RRR, 3/6 holosystolic murmur at the left axilla RESPIRATORY:  Clear to auscultation without rales, wheezing or rhonchi  ABDOMEN: Soft, non-tender, non-distended MUSCULOSKELETAL:  No edema; No deformity  SKIN: Warm and dry NEUROLOGIC:  Alert and oriented x 3 PSYCHIATRIC:  Normal affect   ASSESSMENT:    1. Severe mitral regurgitation   2. ESRD (end stage renal disease) ([place])   3. Essential hypertension   4. Coronary artery disease involving native coronary artery of native heart without angina pectoris   5. SVT (supraventricular tachycardia) (HCC)    PLAN:    In order of problems listed above:  The patient appears to have severe mitral regurgitation.  I personally reviewed his echo images this morning and compared his echo images side-by-side to his previous study.  His mitral regurgitation appears significantly worse than the previous study.  He also has worsening tricuspid regurgitation, quantified as mild on today's study but I think it looks significantly  worse than that.  The dynamic nature of his mitral regurgitation over time is suggestive of secondary mitral regurgitation probably related to posterior leaflet restriction and annular dilatation.  The patient has symptoms of exertional dyspnea and orthopnea with occasional chest pressure.  He had a recent heart catheterization last year demonstrating nonobstructive CAD.  I suspect his symptoms are related to valvular heart disease and chronic diastolic heart failure.  We discussed potential treatment options that include transcatheter therapies such as transcatheter  edge-to-edge repair of the mitral valve as well as conventional surgical therapies that could address both his mitral and tricuspid valve disease.  The patient's comorbid conditions are significant and include end-stage renal disease.  However he is functionally independent and being considered for renal transplantation.  I think it is best to repeat a transesophageal echocardiogram to better assess the functional anatomy of his mitral and tricuspid valves and better define his treatment options.  I have reviewed the risks, indications, and alternatives to transesophageal echo with the patient who understands and agrees to proceed.  Once his TEE is completed, I will review his case with our multidisciplinary heart valve team. Appears stable Controlled on medical therapy and hemodialysis Recent cardiac catheterization data reviewed with mild to moderate nonobstructive CAD noted Felt to be atrial tachycardia, previously evaluated by Dr. Quentin Ore, treated medically with beta-blockade.      Shared Decision Making/Informed Consent The risks [esophageal damage, perforation (1:10,000 risk), bleeding, pharyngeal hematoma as well as other potential complications associated with conscious sedation including aspiration, arrhythmia, respiratory failure and death], benefits (treatment guidance and diagnostic support) and alternatives of a transesophageal echocardiogram were discussed in detail with Mr. Boyden and he is willing to proceed.     Medication Adjustments/Labs and Tests Ordered: Current medicines are reviewed at length with the patient today.  Concerns regarding medicines are outlined above.  Orders Placed This Encounter  Procedures   CBC   Basic metabolic panel   No orders of the defined types were placed in this encounter.   Patient Instructions  Medication Instructions:  Your physician recommends that you continue on your current medications as directed. Please refer to the Current Medication  list given to you today.  *If you need a refill on your cardiac medications before your next appointment, please call your pharmacy*  Lab Work: CBC, BMET If you have labs (blood work) drawn today and your tests are completely normal, you will receive your results only by: [place] (if you have MyChart) OR A paper copy in the mail If you have any lab test that is abnormal or we need to change your treatment, we will call you to review the results.  Testing/Procedures: Transesophageal Echocardiogram Your physician has requested that you have a TEE. During a TEE, sound waves are used to create images of your heart. It provides your doctor with information about the size and shape of your heart and how well your heart's chambers and valves are working. In this test, a transducer is attached to the end of a flexible tube that's guided down your throat and into your esophagus (the tube leading from you mouth to your stomach) to get a more detailed image of your heart. You are not awake for the procedure. Please see the instruction sheet given to you today. For further information please visit [email or web address].  Follow-Up: At [facility], you and your health needs are our priority.  As part of our continuing mission to provide you with exceptional heart care, we  have created designated Provider Care Teams.  These Care Teams include your primary Cardiologist (physician) and Advanced Practice Providers (APPs -  Physician Assistants and Nurse Practitioners) who all work together to provide you with the care you need, when you need it.  Your next appointment:   4 week(s)  The format for your next appointment:   In Person  Provider:   [name], MD    Other Instructions  You are scheduled for a TEE on Tuesday, June 03, 2022 with Dr. Margaretann Loveless.  Please arrive at the [facility] (Main Entrance A) at [facility]: [address] [place], [place] [address] at 11:15 am. (1 hour  prior to procedure)  DIET: Nothing to eat or drink after midnight except a sip of water with medications (see medication instructions below)  FYI: For your safety, and to allow [place] to monitor your vital signs accurately during the surgery/procedure we request that   if you have artificial nails, gel coating, SNS etc. Please have those removed prior to your surgery/procedure. Not having the nail coverings /polish removed may result in cancellation or delay of your surgery/procedure.   Medication Instructions:  Continue your Aspirin/same as home meds You will need to continue your anticoagulant after your procedure until you  are told by your  Provider that it is safe to stop   Labs: Today  Bring your insurance cards.  *Special Note: Every effort is made to have your procedure done on time. Occasionally there are emergencies that occur at the hospital that may cause delays. Please be patient if a delay does occur.    Important Information About Sugar         Signed, [name], MD  05/30/2022 5:08 PM    [place]

## 2022-05-30 DIAGNOSIS — N186 End stage renal disease: Secondary | ICD-10-CM | POA: Diagnosis not present

## 2022-05-30 DIAGNOSIS — E876 Hypokalemia: Secondary | ICD-10-CM | POA: Diagnosis not present

## 2022-05-30 DIAGNOSIS — L299 Pruritus, unspecified: Secondary | ICD-10-CM | POA: Diagnosis not present

## 2022-05-30 DIAGNOSIS — Z992 Dependence on renal dialysis: Secondary | ICD-10-CM | POA: Diagnosis not present

## 2022-05-30 DIAGNOSIS — D509 Iron deficiency anemia, unspecified: Secondary | ICD-10-CM | POA: Diagnosis not present

## 2022-05-30 DIAGNOSIS — N2581 Secondary hyperparathyroidism of renal origin: Secondary | ICD-10-CM | POA: Diagnosis not present

## 2022-05-30 DIAGNOSIS — D631 Anemia in chronic kidney disease: Secondary | ICD-10-CM | POA: Diagnosis not present

## 2022-06-02 DIAGNOSIS — E876 Hypokalemia: Secondary | ICD-10-CM | POA: Diagnosis not present

## 2022-06-02 DIAGNOSIS — N2581 Secondary hyperparathyroidism of renal origin: Secondary | ICD-10-CM | POA: Diagnosis not present

## 2022-06-02 DIAGNOSIS — N186 End stage renal disease: Secondary | ICD-10-CM | POA: Diagnosis not present

## 2022-06-02 DIAGNOSIS — Z992 Dependence on renal dialysis: Secondary | ICD-10-CM | POA: Diagnosis not present

## 2022-06-02 DIAGNOSIS — D509 Iron deficiency anemia, unspecified: Secondary | ICD-10-CM | POA: Diagnosis not present

## 2022-06-02 DIAGNOSIS — L299 Pruritus, unspecified: Secondary | ICD-10-CM | POA: Diagnosis not present

## 2022-06-02 DIAGNOSIS — D631 Anemia in chronic kidney disease: Secondary | ICD-10-CM | POA: Diagnosis not present

## 2022-06-03 ENCOUNTER — Ambulatory Visit (HOSPITAL_BASED_OUTPATIENT_CLINIC_OR_DEPARTMENT_OTHER)
Admission: RE | Admit: 2022-06-03 | Discharge: 2022-06-03 | Disposition: A | Discharge status: Home / Self Care | Payer: Medicare Other | Source: Ambulatory Visit | Attending: Cardiovascular Disease | Admitting: Cardiovascular Disease

## 2022-06-03 ENCOUNTER — Ambulatory Visit (HOSPITAL_COMMUNITY): Payer: Medicare Other | Admitting: General Practice

## 2022-06-03 ENCOUNTER — Other Ambulatory Visit: Payer: Self-pay

## 2022-06-03 ENCOUNTER — Encounter (HOSPITAL_COMMUNITY): Payer: Self-pay | Admitting: Internal Medicine

## 2022-06-03 ENCOUNTER — Ambulatory Visit (HOSPITAL_BASED_OUTPATIENT_CLINIC_OR_DEPARTMENT_OTHER): Payer: Medicare Other | Admitting: General Practice

## 2022-06-03 ENCOUNTER — Ambulatory Visit (HOSPITAL_COMMUNITY)
Admission: RE | Admit: 2022-06-03 | Discharge: 2022-06-03 | Disposition: A | Discharge status: Home / Self Care | Payer: Medicare Other | Attending: Internal Medicine | Admitting: Internal Medicine

## 2022-06-03 ENCOUNTER — Encounter (HOSPITAL_COMMUNITY)
Admission: RE | Disposition: A | Discharge status: Home / Self Care | Payer: Self-pay | Source: Home / Self Care | Attending: Internal Medicine

## 2022-06-03 DIAGNOSIS — I083 Combined rheumatic disorders of mitral, aortic and tricuspid valves: Secondary | ICD-10-CM | POA: Insufficient documentation

## 2022-06-03 DIAGNOSIS — J449 Chronic obstructive pulmonary disease, unspecified: Secondary | ICD-10-CM

## 2022-06-03 DIAGNOSIS — I471 Supraventricular tachycardia: Secondary | ICD-10-CM | POA: Diagnosis not present

## 2022-06-03 DIAGNOSIS — I5032 Chronic diastolic (congestive) heart failure: Secondary | ICD-10-CM | POA: Insufficient documentation

## 2022-06-03 DIAGNOSIS — Z87891 Personal history of nicotine dependence: Secondary | ICD-10-CM | POA: Diagnosis not present

## 2022-06-03 DIAGNOSIS — Z992 Dependence on renal dialysis: Secondary | ICD-10-CM | POA: Diagnosis not present

## 2022-06-03 DIAGNOSIS — I2510 Atherosclerotic heart disease of native coronary artery without angina pectoris: Secondary | ICD-10-CM | POA: Insufficient documentation

## 2022-06-03 DIAGNOSIS — I340 Nonrheumatic mitral (valve) insufficiency: Secondary | ICD-10-CM | POA: Diagnosis not present

## 2022-06-03 DIAGNOSIS — N186 End stage renal disease: Secondary | ICD-10-CM | POA: Insufficient documentation

## 2022-06-03 DIAGNOSIS — I700 Atherosclerosis of aorta: Secondary | ICD-10-CM | POA: Diagnosis not present

## 2022-06-03 DIAGNOSIS — E039 Hypothyroidism, unspecified: Secondary | ICD-10-CM

## 2022-06-03 DIAGNOSIS — I132 Hypertensive heart and chronic kidney disease with heart failure and with stage 5 chronic kidney disease, or end stage renal disease: Secondary | ICD-10-CM | POA: Insufficient documentation

## 2022-06-03 DIAGNOSIS — I088 Other rheumatic multiple valve diseases: Secondary | ICD-10-CM | POA: Diagnosis not present

## 2022-06-03 DIAGNOSIS — I120 Hypertensive chronic kidney disease with stage 5 chronic kidney disease or end stage renal disease: Secondary | ICD-10-CM | POA: Diagnosis not present

## 2022-06-03 HISTORY — PX: BUBBLE STUDY: SHX6837

## 2022-06-03 HISTORY — PX: TEE WITHOUT CARDIOVERSION: SHX5443

## 2022-06-03 LAB — ECHO TEE
MV M vel: 4.06 m/s
MV Peak grad: 65.8 mmHg
MV VTI: 2.93 cm2

## 2022-06-03 LAB — POCT I-STAT, CHEM 8
BUN: 42 mg/dL — ABNORMAL HIGH (ref 6–20)
Calcium, Ion: 1.18 mmol/L (ref 1.15–1.40)
Chloride: 105 mmol/L (ref 98–111)
Creatinine, Ser: 12.2 mg/dL — ABNORMAL HIGH (ref 0.61–1.24)
Glucose, Bld: 95 mg/dL (ref 70–99)
HCT: 35 % — ABNORMAL LOW (ref 39.0–52.0)
Hemoglobin: 11.9 g/dL — ABNORMAL LOW (ref 13.0–17.0)
Potassium: 3.9 mmol/L (ref 3.5–5.1)
Sodium: 141 mmol/L (ref 135–145)
TCO2: 24 mmol/L (ref 22–32)

## 2022-06-03 SURGERY — ECHOCARDIOGRAM, TRANSESOPHAGEAL
Anesthesia: Monitor Anesthesia Care

## 2022-06-03 MED ORDER — PROPOFOL 500 MG/50ML IV EMUL
INTRAVENOUS | Status: DC | PRN
  Administered 2022-06-03: 125 ug/kg/min via INTRAVENOUS

## 2022-06-03 MED ORDER — LIDOCAINE 2% (20 MG/ML) 5 ML SYRINGE
INTRAMUSCULAR | Status: DC | PRN
  Administered 2022-06-03: 100 mg via INTRAVENOUS

## 2022-06-03 MED ORDER — PROPOFOL 10 MG/ML IV BOLUS
INTRAVENOUS | Status: DC | PRN
  Administered 2022-06-03 (×3): 20 mg via INTRAVENOUS

## 2022-06-03 MED ORDER — PHENYLEPHRINE 80 MCG/ML (10ML) SYRINGE FOR IV PUSH (FOR BLOOD PRESSURE SUPPORT)
PREFILLED_SYRINGE | INTRAVENOUS | Status: DC | PRN
  Administered 2022-06-03: 80 ug via INTRAVENOUS
  Administered 2022-06-03: 160 ug via INTRAVENOUS
  Administered 2022-06-03 (×2): 80 ug via INTRAVENOUS
  Administered 2022-06-03: 160 ug via INTRAVENOUS
  Administered 2022-06-03 (×3): 80 ug via INTRAVENOUS

## 2022-06-03 MED ORDER — BUTAMBEN-TETRACAINE-BENZOCAINE 2-2-14 % EX AERO
INHALATION_SPRAY | CUTANEOUS | Status: DC | PRN
  Administered 2022-06-03: 2 via TOPICAL

## 2022-06-03 MED ORDER — SODIUM CHLORIDE 0.9 % IV SOLN: INTRAVENOUS | Status: DC | PRN

## 2022-06-03 NOTE — Interval H&P Note (Signed)
History and Physical Interval Note:  06/03/2022 12:43 PM  Rafik Koppel Skow  has presented today for surgery, with the diagnosis of SEVERE MITRAL REGURGITATION.  The various methods of treatment have been discussed with the patient and family. After consideration of risks, benefits and other options for treatment, the patient has consented to  Procedure(s): TRANSESOPHAGEAL ECHOCARDIOGRAM (TEE) (N/A) as a surgical intervention.  The patient's history has been reviewed, patient examined, no change in status, stable for surgery.  I have reviewed the patient's chart and labs.  Questions were answered to the patient's satisfaction.     Elouise Munroe

## 2022-06-03 NOTE — Anesthesia Postprocedure Evaluation (Signed)
Anesthesia Post Note  Patient: Albert Eaton  Procedure(s) Performed: TRANSESOPHAGEAL ECHOCARDIOGRAM (TEE) BUBBLE STUDY     Patient location during evaluation: Endoscopy Anesthesia Type: MAC Level of consciousness: awake and alert, oriented and patient cooperative Pain management: pain level controlled Vital Signs Assessment: post-procedure vital signs reviewed and stable Respiratory status: nonlabored ventilation, spontaneous breathing and respiratory function stable Cardiovascular status: stable and blood pressure returned to baseline Postop Assessment: no apparent nausea or vomiting Anesthetic complications: no   No notable events documented.  Last Vitals:  Vitals:   06/03/22 1407 06/03/22 1414  BP: 110/77 (!) 125/95  Pulse: 77 77  Resp: 17 20  Temp:    SpO2: 96% 98%    Last Pain:  Vitals:   06/03/22 1414  TempSrc:   PainSc: 0-No pain                 Imogean Ciampa,E. Veronica Guerrant

## 2022-06-03 NOTE — Progress Notes (Signed)
  Echocardiogram Echocardiogram Transesophageal has been performed.  Albert Eaton 06/03/2022, 1:58 PM

## 2022-06-03 NOTE — Anesthesia Procedure Notes (Signed)
Procedure Name: MAC Date/Time: 06/03/2022 12:50 PM  Performed by: Dorann Lodge, CRNAPre-anesthesia Checklist: Patient identified, Suction available, Emergency Drugs available and Patient being monitored Patient Re-evaluated:Patient Re-evaluated prior to induction Oxygen Delivery Method: Nasal cannula Airway Equipment and Method: Bite block Dental Injury: Teeth and Oropharynx as per pre-operative assessment

## 2022-06-03 NOTE — Discharge Instructions (Signed)
TEE  YOU HAD AN CARDIAC PROCEDURE TODAY: Refer to the procedure report and other information in the discharge instructions given to you for any specific questions about what was found during the examination. If this information does not answer your questions, please call [place] office at [phone] to clarify.   DIET: Your first meal following the procedure should be a light meal and then it is ok to progress to your normal diet. A half-sandwich or bowl of soup is an example of a good first meal. Heavy or fried foods are harder to digest and may make you feel nauseous or bloated. Drink plenty of fluids but you should avoid alcoholic beverages for 24 hours. If you had a esophageal dilation, please see attached instructions for diet.   ACTIVITY: Your care partner should take you home directly after the procedure. You should plan to take it easy, moving slowly for the rest of the day. You can resume normal activity the day after the procedure however YOU SHOULD NOT DRIVE, use power tools, machinery or perform tasks that involve climbing or major physical exertion for 24 hours (because of the sedation medicines used during the test).   SYMPTOMS TO REPORT IMMEDIATELY: A cardiologist can be reached at any hour. Please call [phone] for any of the following symptoms:  Vomiting of blood or coffee ground material  New, significant abdominal pain  New, significant chest pain or pain under the shoulder blades  Painful or persistently difficult swallowing  New shortness of breath  Black, tarry-looking or red, bloody stools  FOLLOW UP:  Please also call with any specific questions about appointments or follow up tests.

## 2022-06-03 NOTE — Anesthesia Preprocedure Evaluation (Addendum)
Anesthesia Evaluation  Patient identified by MRN, date of birth, ID band Patient awake    Reviewed: Allergy & Precautions, NPO status , Patient's Chart, lab work & pertinent test results  History of Anesthesia Complications (+) PONV  Airway Mallampati: II  TM Distance: >3 FB Neck ROM: Full    Dental  (+) Dental Advisory Given, Missing, Chipped, Poor Dentition   Pulmonary sleep apnea (does not use CPAP) , COPD, former smoker,    breath sounds clear to auscultation       Cardiovascular hypertension, Pt. on medications and Pt. on home beta blockers + CAD (non-obstructive by cath)  + Valvular Problems/Murmurs MR  Rhythm:Regular Rate:Normal + Systolic murmurs 05/30/939 ECHO: EF 55-60%, normal LVF, mild LVH, mod-severe MR, post directed jet, mild-mod TR   Neuro/Psych    GI/Hepatic GERD  Medicated and Controlled,(+) Hepatitis -  Endo/Other  Hypothyroidism   Renal/GU ESRF and DialysisRenal disease     Musculoskeletal   Abdominal   Peds  Hematology  (+) HIV,   Anesthesia Other Findings   Reproductive/Obstetrics                            Anesthesia Physical Anesthesia Plan  ASA: 4  Anesthesia Plan: MAC   Post-op Pain Management: Minimal or no pain anticipated   Induction:   PONV Risk Score and Plan: 2 and Treatment may vary due to age or medical condition  Airway Management Planned: Natural Airway and Nasal Cannula  Additional Equipment: None  Intra-op Plan:   Post-operative Plan:   Informed Consent: I have reviewed the patients History and Physical, chart, labs and discussed the procedure including the risks, benefits and alternatives for the proposed anesthesia with the patient or authorized representative who has indicated his/her understanding and acceptance.     Dental advisory given  Plan Discussed with: CRNA and Surgeon  Anesthesia Plan Comments:         Anesthesia  Quick Evaluation

## 2022-06-03 NOTE — CV Procedure (Signed)
INDICATIONS: MR  PROCEDURE:   Informed consent was obtained prior to the procedure. The risks, benefits and alternatives for the procedure were discussed and the patient comprehended these risks.  Risks include, but are not limited to, cough, sore throat, vomiting, nausea, somnolence, esophageal and stomach trauma or perforation, bleeding, low blood pressure, aspiration, pneumonia, infection, trauma to the teeth and death.    After a procedural time-out, the oropharynx was anesthetized with 20% benzocaine spray.   During this procedure the patient was administered propofol per anesthesia.  The patient's heart rate, blood pressure, and oxygen saturation were monitored continuously during the procedure. The period of conscious sedation was 30 minutes, of which I was present face-to-face 100% of this time.  The transesophageal probe was inserted in the esophagus and stomach without difficulty and multiple views were obtained.  The patient was kept under observation until the patient left the procedure room.  The patient left the procedure room in stable condition.   Agitated microbubble saline contrast was administered.  COMPLICATIONS:    There were no immediate complications.  FINDINGS:   FORMAL ECHOCARDIOGRAM REPORT PENDING Severe MR. Mechanism appears likely functional and commissural with some relative anterior leaflet override. No definite pulmonary vein reversals in systole, may be due to low blood pressure during sedation. At ambulatory blood pressures, suspect MR is unequivocally severe.  RECOMMENDATIONS:     D/c home when alert.  Time Spent Directly with the Patient:  60 minutes   Iliya Spivack A Margaretann Loveless 06/03/2022, 1:45 PM

## 2022-06-03 NOTE — Transfer of Care (Signed)
Immediate Anesthesia Transfer of Care Note  Patient: Juel Bellerose Stickler  Procedure(s) Performed: TRANSESOPHAGEAL ECHOCARDIOGRAM (TEE) BUBBLE STUDY  Patient Location: Endoscopy Unit  Anesthesia Type:MAC  Level of Consciousness: awake and drowsy  Airway & Oxygen Therapy: Patient Spontanous Breathing  Post-op Assessment: Report given to RN and Post -op Vital signs reviewed and stable  Post vital signs: Reviewed and stable  Last Vitals:  Vitals Value Taken Time  BP 93/68 06/03/22 1344  Temp    Pulse 76 06/03/22 1345  Resp 17 06/03/22 1345  SpO2 95 % 06/03/22 1345  Vitals shown include unvalidated device data.  Last Pain:  Vitals:   06/03/22 1135  TempSrc: Temporal  PainSc: 0-No pain         Complications: No notable events documented.

## 2022-06-04 ENCOUNTER — Encounter (HOSPITAL_COMMUNITY): Payer: Self-pay | Admitting: Internal Medicine

## 2022-06-04 DIAGNOSIS — D631 Anemia in chronic kidney disease: Secondary | ICD-10-CM | POA: Diagnosis not present

## 2022-06-04 DIAGNOSIS — N186 End stage renal disease: Secondary | ICD-10-CM | POA: Diagnosis not present

## 2022-06-04 DIAGNOSIS — D509 Iron deficiency anemia, unspecified: Secondary | ICD-10-CM | POA: Diagnosis not present

## 2022-06-04 DIAGNOSIS — L299 Pruritus, unspecified: Secondary | ICD-10-CM | POA: Diagnosis not present

## 2022-06-04 DIAGNOSIS — E876 Hypokalemia: Secondary | ICD-10-CM | POA: Diagnosis not present

## 2022-06-04 DIAGNOSIS — Z992 Dependence on renal dialysis: Secondary | ICD-10-CM | POA: Diagnosis not present

## 2022-06-04 DIAGNOSIS — N2581 Secondary hyperparathyroidism of renal origin: Secondary | ICD-10-CM | POA: Diagnosis not present

## 2022-06-05 ENCOUNTER — Other Ambulatory Visit: Payer: Self-pay | Admitting: Physician Assistant

## 2022-06-05 ENCOUNTER — Other Ambulatory Visit: Payer: Self-pay | Admitting: Allergy & Immunology

## 2022-06-06 DIAGNOSIS — L299 Pruritus, unspecified: Secondary | ICD-10-CM | POA: Diagnosis not present

## 2022-06-06 DIAGNOSIS — D631 Anemia in chronic kidney disease: Secondary | ICD-10-CM | POA: Diagnosis not present

## 2022-06-06 DIAGNOSIS — E876 Hypokalemia: Secondary | ICD-10-CM | POA: Diagnosis not present

## 2022-06-06 DIAGNOSIS — N186 End stage renal disease: Secondary | ICD-10-CM | POA: Diagnosis not present

## 2022-06-06 DIAGNOSIS — Z992 Dependence on renal dialysis: Secondary | ICD-10-CM | POA: Diagnosis not present

## 2022-06-06 DIAGNOSIS — N2581 Secondary hyperparathyroidism of renal origin: Secondary | ICD-10-CM | POA: Diagnosis not present

## 2022-06-06 DIAGNOSIS — D509 Iron deficiency anemia, unspecified: Secondary | ICD-10-CM | POA: Diagnosis not present

## 2022-06-09 ENCOUNTER — Other Ambulatory Visit: Payer: Self-pay | Admitting: Physician Assistant

## 2022-06-09 ENCOUNTER — Telehealth: Payer: Self-pay

## 2022-06-09 DIAGNOSIS — D631 Anemia in chronic kidney disease: Secondary | ICD-10-CM | POA: Diagnosis not present

## 2022-06-09 DIAGNOSIS — E876 Hypokalemia: Secondary | ICD-10-CM | POA: Diagnosis not present

## 2022-06-09 DIAGNOSIS — Z992 Dependence on renal dialysis: Secondary | ICD-10-CM | POA: Diagnosis not present

## 2022-06-09 DIAGNOSIS — D509 Iron deficiency anemia, unspecified: Secondary | ICD-10-CM | POA: Diagnosis not present

## 2022-06-09 DIAGNOSIS — L299 Pruritus, unspecified: Secondary | ICD-10-CM | POA: Diagnosis not present

## 2022-06-09 DIAGNOSIS — N2581 Secondary hyperparathyroidism of renal origin: Secondary | ICD-10-CM | POA: Diagnosis not present

## 2022-06-09 DIAGNOSIS — N186 End stage renal disease: Secondary | ICD-10-CM | POA: Diagnosis not present

## 2022-06-09 NOTE — Telephone Encounter (Signed)
Per [employer] review of 06/03/22 TEE:  "For patient Albert Eaton: This is Primary MR This looks like a suitable valve for a MitraClip. The fossa looks approachable for transseptal puncture in the SAXB and Bicaval views. LA dimensions are large enough for device steering and straddle. There is no clear LVOT view proving pathology. 3D enface view shows an A1 prolapse/override causing a posteriorly directed jet. The posterior leaflet measures about 1.75 cm in the LVOT degree grasping view. MVA measures about 6 cm2 and gradient measures 2 mmHg (74 BPM). There is a small medial jet that should not be prioritized. Based on this information, I would recommend an XTW."

## 2022-06-10 ENCOUNTER — Encounter: Payer: Medicare Other | Admitting: Internal Medicine

## 2022-06-11 DIAGNOSIS — D631 Anemia in chronic kidney disease: Secondary | ICD-10-CM | POA: Diagnosis not present

## 2022-06-11 DIAGNOSIS — L299 Pruritus, unspecified: Secondary | ICD-10-CM | POA: Diagnosis not present

## 2022-06-11 DIAGNOSIS — E876 Hypokalemia: Secondary | ICD-10-CM | POA: Diagnosis not present

## 2022-06-11 DIAGNOSIS — D509 Iron deficiency anemia, unspecified: Secondary | ICD-10-CM | POA: Diagnosis not present

## 2022-06-11 DIAGNOSIS — Z992 Dependence on renal dialysis: Secondary | ICD-10-CM | POA: Diagnosis not present

## 2022-06-11 DIAGNOSIS — N2581 Secondary hyperparathyroidism of renal origin: Secondary | ICD-10-CM | POA: Diagnosis not present

## 2022-06-11 DIAGNOSIS — N186 End stage renal disease: Secondary | ICD-10-CM | POA: Diagnosis not present

## 2022-06-12 ENCOUNTER — Telehealth: Payer: Self-pay

## 2022-06-12 DIAGNOSIS — G4733 Obstructive sleep apnea (adult) (pediatric): Secondary | ICD-10-CM | POA: Diagnosis not present

## 2022-06-12 NOTE — Telephone Encounter (Signed)
Scheduled the patient for surgical evaluation with Dr. Lavonna Monarch on 07/21/2022. Cancelled appointment with Dr. [name]. He was grateful for call and agrees with plan.

## 2022-06-12 NOTE — Telephone Encounter (Addendum)
Reviewed films during Valve Team discussion.   Per Dr. [name], will cancel upcoming return office visit with him and schedule TCT evaluation with Dr. Lavonna Monarch.   The patient asked to call him back at a later time to discuss.

## 2022-06-13 DIAGNOSIS — D631 Anemia in chronic kidney disease: Secondary | ICD-10-CM | POA: Diagnosis not present

## 2022-06-13 DIAGNOSIS — D509 Iron deficiency anemia, unspecified: Secondary | ICD-10-CM | POA: Diagnosis not present

## 2022-06-13 DIAGNOSIS — N2581 Secondary hyperparathyroidism of renal origin: Secondary | ICD-10-CM | POA: Diagnosis not present

## 2022-06-13 DIAGNOSIS — N186 End stage renal disease: Secondary | ICD-10-CM | POA: Diagnosis not present

## 2022-06-13 DIAGNOSIS — L299 Pruritus, unspecified: Secondary | ICD-10-CM | POA: Diagnosis not present

## 2022-06-13 DIAGNOSIS — Z992 Dependence on renal dialysis: Secondary | ICD-10-CM | POA: Diagnosis not present

## 2022-06-13 DIAGNOSIS — Z7682 Awaiting organ transplant status: Secondary | ICD-10-CM | POA: Diagnosis not present

## 2022-06-13 DIAGNOSIS — E876 Hypokalemia: Secondary | ICD-10-CM | POA: Diagnosis not present

## 2022-06-16 DIAGNOSIS — E876 Hypokalemia: Secondary | ICD-10-CM | POA: Diagnosis not present

## 2022-06-16 DIAGNOSIS — N186 End stage renal disease: Secondary | ICD-10-CM | POA: Diagnosis not present

## 2022-06-16 DIAGNOSIS — L299 Pruritus, unspecified: Secondary | ICD-10-CM | POA: Diagnosis not present

## 2022-06-16 DIAGNOSIS — Z992 Dependence on renal dialysis: Secondary | ICD-10-CM | POA: Diagnosis not present

## 2022-06-16 DIAGNOSIS — D631 Anemia in chronic kidney disease: Secondary | ICD-10-CM | POA: Diagnosis not present

## 2022-06-16 DIAGNOSIS — N2581 Secondary hyperparathyroidism of renal origin: Secondary | ICD-10-CM | POA: Diagnosis not present

## 2022-06-16 DIAGNOSIS — D509 Iron deficiency anemia, unspecified: Secondary | ICD-10-CM | POA: Diagnosis not present

## 2022-06-18 DIAGNOSIS — Z992 Dependence on renal dialysis: Secondary | ICD-10-CM | POA: Diagnosis not present

## 2022-06-18 DIAGNOSIS — L299 Pruritus, unspecified: Secondary | ICD-10-CM | POA: Diagnosis not present

## 2022-06-18 DIAGNOSIS — N2581 Secondary hyperparathyroidism of renal origin: Secondary | ICD-10-CM | POA: Diagnosis not present

## 2022-06-18 DIAGNOSIS — D509 Iron deficiency anemia, unspecified: Secondary | ICD-10-CM | POA: Diagnosis not present

## 2022-06-18 DIAGNOSIS — D631 Anemia in chronic kidney disease: Secondary | ICD-10-CM | POA: Diagnosis not present

## 2022-06-18 DIAGNOSIS — N186 End stage renal disease: Secondary | ICD-10-CM | POA: Diagnosis not present

## 2022-06-18 DIAGNOSIS — E876 Hypokalemia: Secondary | ICD-10-CM | POA: Diagnosis not present

## 2022-06-19 ENCOUNTER — Ambulatory Visit: Payer: Medicare Other | Admitting: Pulmonary Disease

## 2022-06-20 DIAGNOSIS — E876 Hypokalemia: Secondary | ICD-10-CM | POA: Diagnosis not present

## 2022-06-20 DIAGNOSIS — N2581 Secondary hyperparathyroidism of renal origin: Secondary | ICD-10-CM | POA: Diagnosis not present

## 2022-06-20 DIAGNOSIS — D631 Anemia in chronic kidney disease: Secondary | ICD-10-CM | POA: Diagnosis not present

## 2022-06-20 DIAGNOSIS — D509 Iron deficiency anemia, unspecified: Secondary | ICD-10-CM | POA: Diagnosis not present

## 2022-06-20 DIAGNOSIS — L299 Pruritus, unspecified: Secondary | ICD-10-CM | POA: Diagnosis not present

## 2022-06-20 DIAGNOSIS — Z992 Dependence on renal dialysis: Secondary | ICD-10-CM | POA: Diagnosis not present

## 2022-06-20 DIAGNOSIS — N186 End stage renal disease: Secondary | ICD-10-CM | POA: Diagnosis not present

## 2022-06-22 DIAGNOSIS — N2581 Secondary hyperparathyroidism of renal origin: Secondary | ICD-10-CM | POA: Diagnosis not present

## 2022-06-22 DIAGNOSIS — Z992 Dependence on renal dialysis: Secondary | ICD-10-CM | POA: Diagnosis not present

## 2022-06-22 DIAGNOSIS — L299 Pruritus, unspecified: Secondary | ICD-10-CM | POA: Diagnosis not present

## 2022-06-22 DIAGNOSIS — E876 Hypokalemia: Secondary | ICD-10-CM | POA: Diagnosis not present

## 2022-06-22 DIAGNOSIS — D631 Anemia in chronic kidney disease: Secondary | ICD-10-CM | POA: Diagnosis not present

## 2022-06-22 DIAGNOSIS — N186 End stage renal disease: Secondary | ICD-10-CM | POA: Diagnosis not present

## 2022-06-22 DIAGNOSIS — D509 Iron deficiency anemia, unspecified: Secondary | ICD-10-CM | POA: Diagnosis not present

## 2022-06-23 DIAGNOSIS — L299 Pruritus, unspecified: Secondary | ICD-10-CM | POA: Diagnosis not present

## 2022-06-23 DIAGNOSIS — N2581 Secondary hyperparathyroidism of renal origin: Secondary | ICD-10-CM | POA: Diagnosis not present

## 2022-06-23 DIAGNOSIS — E876 Hypokalemia: Secondary | ICD-10-CM | POA: Diagnosis not present

## 2022-06-23 DIAGNOSIS — D631 Anemia in chronic kidney disease: Secondary | ICD-10-CM | POA: Diagnosis not present

## 2022-06-23 DIAGNOSIS — D509 Iron deficiency anemia, unspecified: Secondary | ICD-10-CM | POA: Diagnosis not present

## 2022-06-23 DIAGNOSIS — Z992 Dependence on renal dialysis: Secondary | ICD-10-CM | POA: Diagnosis not present

## 2022-06-23 DIAGNOSIS — N186 End stage renal disease: Secondary | ICD-10-CM | POA: Diagnosis not present

## 2022-06-24 ENCOUNTER — Ambulatory Visit: Payer: Medicare Other | Admitting: Cardiovascular Disease

## 2022-06-25 DIAGNOSIS — D509 Iron deficiency anemia, unspecified: Secondary | ICD-10-CM | POA: Diagnosis not present

## 2022-06-25 DIAGNOSIS — E876 Hypokalemia: Secondary | ICD-10-CM | POA: Diagnosis not present

## 2022-06-25 DIAGNOSIS — L299 Pruritus, unspecified: Secondary | ICD-10-CM | POA: Diagnosis not present

## 2022-06-25 DIAGNOSIS — N186 End stage renal disease: Secondary | ICD-10-CM | POA: Diagnosis not present

## 2022-06-25 DIAGNOSIS — D631 Anemia in chronic kidney disease: Secondary | ICD-10-CM | POA: Diagnosis not present

## 2022-06-25 DIAGNOSIS — Z992 Dependence on renal dialysis: Secondary | ICD-10-CM | POA: Diagnosis not present

## 2022-06-25 DIAGNOSIS — N2581 Secondary hyperparathyroidism of renal origin: Secondary | ICD-10-CM | POA: Diagnosis not present

## 2022-06-26 ENCOUNTER — Ambulatory Visit (INDEPENDENT_AMBULATORY_CARE_PROVIDER_SITE_OTHER)
Admission: RE | Admit: 2022-06-26 | Discharge: 2022-06-26 | Disposition: A | Discharge status: Home / Self Care | Payer: Medicare Other | Source: Ambulatory Visit | Attending: Nurse Practitioner | Admitting: Nurse Practitioner

## 2022-06-26 ENCOUNTER — Other Ambulatory Visit: Payer: Self-pay | Admitting: Infectious Diseases

## 2022-06-26 ENCOUNTER — Ambulatory Visit (INDEPENDENT_AMBULATORY_CARE_PROVIDER_SITE_OTHER): Payer: Medicare Other | Admitting: Nurse Practitioner

## 2022-06-26 VITALS — BP 128/86 | HR 82 | Temp 97.4°F | Resp 16 | Ht 70.0 in | Wt 198.5 lb

## 2022-06-26 DIAGNOSIS — I129 Hypertensive chronic kidney disease with stage 1 through stage 4 chronic kidney disease, or unspecified chronic kidney disease: Secondary | ICD-10-CM | POA: Diagnosis not present

## 2022-06-26 DIAGNOSIS — Z992 Dependence on renal dialysis: Secondary | ICD-10-CM | POA: Diagnosis not present

## 2022-06-26 DIAGNOSIS — M545 Low back pain, unspecified: Secondary | ICD-10-CM

## 2022-06-26 DIAGNOSIS — M25571 Pain in right ankle and joints of right foot: Secondary | ICD-10-CM

## 2022-06-26 DIAGNOSIS — N186 End stage renal disease: Secondary | ICD-10-CM | POA: Diagnosis not present

## 2022-06-26 DIAGNOSIS — B20 Human immunodeficiency virus [HIV] disease: Secondary | ICD-10-CM

## 2022-06-26 DIAGNOSIS — G8929 Other chronic pain: Secondary | ICD-10-CM | POA: Insufficient documentation

## 2022-06-26 DIAGNOSIS — R200 Anesthesia of skin: Secondary | ICD-10-CM | POA: Diagnosis not present

## 2022-06-26 MED ORDER — METHOCARBAMOL 500 MG PO TABS: 500.0000 mg | ORAL_TABLET | Freq: Two times a day (BID) | ORAL | 0 refills | Status: DC | PRN

## 2022-06-26 NOTE — Assessment & Plan Note (Signed)
Patient does have a history of problems of gout and had fracture right ankle in the past.  The waxing and waning discomfort.  Patient states is improved since inception this time.  Pending x-ray result

## 2022-06-26 NOTE — Assessment & Plan Note (Signed)
Patient was written prednisone from Dr. Hollie Salk who is his nephrologist.  He has not started taking medication as of yet.  Encourage patient to take medication as she prescribed.  Also add on methocarbamol 500 mg twice daily as needed.  Sedation precautions reviewed follow-up if no improvement

## 2022-06-26 NOTE — Progress Notes (Signed)
Acute Office Visit  Subjective:     Patient ID: Albert Eaton, male    DOB: [birth year], 56 y.o.   MRN: [id number]  Chief Complaint  Patient presents with   Back Pain    Off and on x few months, this recent episode flared up on 06/22/22, on 06/21/22 stood a lot on his feet. Pain is located in the lower back area mostly left side. Has had sensation of burning, sharp, stabbing, achy pain depends on what he is doing. He did have radiation of pain down his right leg on 06/22/22 Has taking tylenol, used heating pad, ice, bio freeze, massager with vibration.      Patient is in today for Back pain   States that the symptoms started 06/21/2022 in the afternoon. States that he was at his sisters retirement party and was on his feet for an extended period of time and then sat down that did not help.  No known injury per his report. States that yesterday that he got up out of bed but had to roll out of bed. States that he had dialysis and was able to go but had to walk slow. After his dialysis it calmed down some.   States hurts mostly with movement. With stress on the body. States it is described as stabbing. Has used tylenol, bio freeze, heat, ice, massager,  and tens unit with no relief   States that on Sunday he started hurting on his right ankle. States that he has had trouble with gout in the past and that he has also fractured that ankle in the past. States that the ankle is feeling better today   Review of Systems  Constitutional:  Negative for chills and fever.  Gastrointestinal:  Negative for diarrhea.  Genitourinary:  Negative for dysuria and hematuria.  Musculoskeletal:  Positive for back pain.  Neurological:  Negative for tingling and weakness.        Objective:    BP 128/86   Pulse 82   Temp (!) 97.4 F (36.3 C)   Resp 16   Ht 5' 10"  (1.778 m)   Wt 198 lb 8 oz (90 kg)   SpO2 98%   BMI 28.48 kg/m    Physical Exam Vitals and nursing note reviewed.  Constitutional:       Appearance: Normal appearance.  Cardiovascular:     Rate and Rhythm: Normal rate and regular rhythm.     Heart sounds: Normal heart sounds.  Pulmonary:     Effort: Pulmonary effort is normal.     Breath sounds: Normal breath sounds.  Musculoskeletal:        General: No tenderness or signs of injury.     Lumbar back: No tenderness or bony tenderness. Negative right straight leg raise test and negative left straight leg raise test.       Back:     Comments: Discomfort elicited with lumbar flexion and with extension against pressure of left lower leg  Neurological:     General: No focal deficit present.     Mental Status: He is alert.     Deep Tendon Reflexes:     Reflex Scores:      Patellar reflexes are 2+ on the right side and 2+ on the left side.    Comments: Bilateral lower extremity strength 5/5     No results found for any visits on 06/26/22.      Assessment & Plan:   Problem List Items Addressed This Visit  Other   Left-sided low back pain without sciatica - Primary    Patient was written prednisone from Dr. Hollie Salk who is his nephrologist.  He has not started taking medication as of yet.  Encourage patient to take medication as she prescribed.  Also add on methocarbamol 500 mg twice daily as needed.  Sedation precautions reviewed follow-up if no improvement      Relevant Medications   predniSONE (DELTASONE) 20 MG tablet   methocarbamol (ROBAXIN) 500 MG tablet   Chronic pain of right ankle    Patient does have a history of problems of gout and had fracture right ankle in the past.  The waxing and waning discomfort.  Patient states is improved since inception this time.  Pending x-ray result      Relevant Medications   predniSONE (DELTASONE) 20 MG tablet   methocarbamol (ROBAXIN) 500 MG tablet   Other Relevant Orders   DG Ankle Complete Right    Meds ordered this encounter  Medications   methocarbamol (ROBAXIN) 500 MG tablet    Sig: Take 1 tablet (500  mg total) by mouth 2 (two) times daily as needed for muscle spasms.    Dispense:  15 tablet    Refill:  0    Order Specific Question:   Supervising Provider    Answer:   TOWER, MARNE A [[id number]    Return if symptoms worsen or fail to improve.  Romilda Garret, NP

## 2022-06-26 NOTE — Patient Instructions (Addendum)
Nice to see you today I will be in touch with the xray once I have them I recommend that you start the prednisone/ steroid. Follow up if no improvement

## 2022-06-27 ENCOUNTER — Telehealth: Payer: Self-pay | Admitting: Nurse Practitioner

## 2022-06-27 DIAGNOSIS — E876 Hypokalemia: Secondary | ICD-10-CM | POA: Diagnosis not present

## 2022-06-27 DIAGNOSIS — N2581 Secondary hyperparathyroidism of renal origin: Secondary | ICD-10-CM | POA: Diagnosis not present

## 2022-06-27 DIAGNOSIS — Z992 Dependence on renal dialysis: Secondary | ICD-10-CM | POA: Diagnosis not present

## 2022-06-27 DIAGNOSIS — M109 Gout, unspecified: Secondary | ICD-10-CM | POA: Diagnosis not present

## 2022-06-27 DIAGNOSIS — D509 Iron deficiency anemia, unspecified: Secondary | ICD-10-CM | POA: Diagnosis not present

## 2022-06-27 DIAGNOSIS — N186 End stage renal disease: Secondary | ICD-10-CM | POA: Diagnosis not present

## 2022-06-27 DIAGNOSIS — D631 Anemia in chronic kidney disease: Secondary | ICD-10-CM | POA: Diagnosis not present

## 2022-06-27 NOTE — Telephone Encounter (Signed)
Left message for patient to call back and schedule Medicare Annual Wellness Visit (AWV) either virtually or phone   *due 03/27/20 awvi per [facility]     This should be a 45 minute visit.  I left my direct # [phone]

## 2022-06-30 DIAGNOSIS — Z992 Dependence on renal dialysis: Secondary | ICD-10-CM | POA: Diagnosis not present

## 2022-06-30 DIAGNOSIS — N2581 Secondary hyperparathyroidism of renal origin: Secondary | ICD-10-CM | POA: Diagnosis not present

## 2022-06-30 DIAGNOSIS — M109 Gout, unspecified: Secondary | ICD-10-CM | POA: Diagnosis not present

## 2022-06-30 DIAGNOSIS — D631 Anemia in chronic kidney disease: Secondary | ICD-10-CM | POA: Diagnosis not present

## 2022-06-30 DIAGNOSIS — N186 End stage renal disease: Secondary | ICD-10-CM | POA: Diagnosis not present

## 2022-06-30 DIAGNOSIS — E876 Hypokalemia: Secondary | ICD-10-CM | POA: Diagnosis not present

## 2022-06-30 DIAGNOSIS — D509 Iron deficiency anemia, unspecified: Secondary | ICD-10-CM | POA: Diagnosis not present

## 2022-07-01 ENCOUNTER — Ambulatory Visit (INDEPENDENT_AMBULATORY_CARE_PROVIDER_SITE_OTHER): Payer: Medicare Other | Admitting: Pulmonary Disease

## 2022-07-01 ENCOUNTER — Encounter: Payer: Self-pay | Admitting: Pulmonary Disease

## 2022-07-01 VITALS — BP 130/82 | HR 85 | Temp 98.2°F | Ht 70.0 in | Wt 200.0 lb

## 2022-07-01 DIAGNOSIS — G4733 Obstructive sleep apnea (adult) (pediatric): Secondary | ICD-10-CM | POA: Diagnosis not present

## 2022-07-01 NOTE — Patient Instructions (Signed)
You are cleared to have surgery from a breathing perspective  As long as you are not having any acutely reversible problems with your breathing, there is no testing indicated.  There is some risk with untreated sleep apnea but despite good effort you have not been able to tolerate CPAP therapy  Good luck with your surgery  Follow-up with Dr. Redmond Baseman once stabilized from surgery for further treatment of sleep apnea

## 2022-07-01 NOTE — Progress Notes (Signed)
Subjective:    Patient ID: Albert Eaton, male    DOB: 24-Jul-1966, 56 y.o.   MRN: [id number]  He was initially diagnosed with mild obstructive sleep apnea was able to tolerate CPAP  Most recent study showed moderate obstructive sleep apnea with moderate reduction in oxygen saturations  He has end-stage renal disease for which he is on hemodialysis tolerated peritoneal dialysis for well  He cannot tolerate anything over his face, even regular mass make him feel like he is suffocating  Has not been able to tolerate CPAP in a long time  Was recently evaluated by Dr. Redmond Baseman for possible inspire device placement  Being evaluated currently for mitral valve disease, surgical options being considered   He does have a history of insomnia still on Klonopin  Significant history of restless legs related to neuropathy  Neurontin helps his neuropathy  Last time he was here we had adjusted CPAP pressures, has tried a different mask to no avail  He still has nonrestorative sleep, frequent awakenings, daytime sleepiness, mouth dryness, occasional headaches in the mornings History of heart disease, end-stage renal disease on dialysis  His weight is stable  Does get short of breath with activity  Remains on antiretrovirals  No acute respiratory tract infections Weight is stable Exercise tolerance is stable   Past Medical History:  Diagnosis Date   Anemia    Aortic atherosclerosis (HCC)    Arthritis    Asthma    Cancer ([place])    renal cyst   Chronic diastolic CHF (congestive heart failure) ([place]) 01/06/2017   Echo 7/16 [facility] in [place], [name]) Mild AI, mild LAE, mild concentric LVH, EF 55, normal wall motion, mild to moderate MR, mild PI, RVSP 55 // Echo 10/09/16 ([facility]):  Moderate LVH, grade 2 diastolic dysfunction, mild MR, moderate LAE    CKD (chronic kidney disease)    Dialysis Mon Wed Fri   Esophagitis    GERD (gastroesophageal reflux disease)    Gout    no current  problems   Heart murmur    never caused any problems   Hepatitis    Hep B   History of nuclear stress test    a. Nuc study 7/16: no scar or ischemia, EF 42 // b. Nuc study 12/17: EF 48, ?small apical ischemia, Low Risk   HIV (human immunodeficiency virus infection) ([place])    HLD (hyperlipidemia)    Hypertension    Hypertensive heart disease with CHF (congestive heart failure) ([place]) 10/09/2016   Hypothyroidism    Mitral regurgitation    OSA (obstructive sleep apnea)    Post-operative nausea and vomiting    1 time as a child, no problems as an adult   Sleep apnea    uses c-pap   Thyroid disease    Wears glasses    Wears partial dentures    Family History  Problem Relation Age of Onset   Hypertension Mother    Heart failure Mother    Hypertension Father    Alcohol abuse Father    Hypertension Sister    Multiple sclerosis Sister    Hypertension Sister    Hypertension Brother    Heart attack Maternal Grandmother 53   Scoliosis Other    Allergic rhinitis Neg Hx    Angioedema Neg Hx    Asthma Neg Hx    Eczema Neg Hx    Immunodeficiency Neg Hx    Urticaria Neg Hx    Colon cancer Neg Hx  Pancreatic cancer Neg Hx    Esophageal cancer Neg Hx    Social History   Socioeconomic History   Marital status: Single    Spouse name: Not on file   Number of children: Not on file   Years of education: Not on file   Highest education level: Not on file  Occupational History   Not on file  Tobacco Use   Smoking status: Former    Packs/day: 1.00    Years: 18.00    Total pack years: 18.00    Types: Cigarettes    Quit date: 2000    Years since quitting: 23.6   Smokeless tobacco: Never  Vaping Use   Vaping Use: Never used  Substance and Sexual Activity   Alcohol use: Not Currently   Drug use: No   Sexual activity: Not Currently    Partners: Male  Other Topics Concern   Not on file  Social History Narrative   Disability      [place]: none   Social Determinants of Health    Financial Resource Strain: Not on file  Food Insecurity: No Food Insecurity (09/04/2020)   Hunger Vital Sign    Worried About Running Out of Food in the Last Year: Never true    Ran Out of Food in the Last Year: Never true  Transportation Needs: No Transportation Needs (09/04/2020)   PRAPARE - Hydrologist (Medical): No    Lack of Transportation (Non-Medical): No  Physical Activity: Not on file  Stress: Not on file  Social Connections: Not on file  Intimate Partner Violence: Not on file     Review of Systems  Constitutional: Negative.   HENT:  Negative for congestion and sneezing.   Eyes: Negative.   Respiratory:  Positive for apnea.   Cardiovascular: Negative.   Gastrointestinal: Negative.   Genitourinary: Negative.   Psychiatric/Behavioral:  Positive for sleep disturbance.   All other systems reviewed and are negative.      Objective:   Physical Exam Constitutional:      Appearance: Normal appearance.  HENT:     Head: Normocephalic.     Nose: No congestion.     Mouth/Throat:     Comments: He does have macroglossia, Mallampati 3 Eyes:     General:        Right eye: No discharge.        Left eye: No discharge.     Pupils: Pupils are equal, round, and reactive to light.  Cardiovascular:     Rate and Rhythm: Normal rate and regular rhythm.     Pulses: Normal pulses.     Heart sounds: Normal heart sounds. No murmur heard.    No friction rub.  Pulmonary:     Effort: Pulmonary effort is normal. No respiratory distress.     Breath sounds: Normal breath sounds. No stridor. No wheezing or rhonchi.  Musculoskeletal:     Cervical back: No rigidity.  Neurological:     Mental Status: He is alert.  Psychiatric:        Mood and Affect: Mood normal.   Polysomnogram from 12/24/2017 reviewed revealing mild obstructive sleep apnea  Home sleep study from 11/12/2021 reviewed by myself showing moderate obstructive sleep apnea with AHI of 23.3,  moderately severe oxygen desaturations  Compliance data is unavailable as he has not been able to tolerate CPAP  Assessment & Plan:  .  Moderate obstructive sleep apnea -Struggle with CPAP for many years. -Despite multiple interventions  and changes to mask and pressures, unable to tolerate CPAP -Claustrophobic  Sleep onset and sleep maintenance insomnia -Hypnotics as tolerated   Regarding mitral valve surgery -He has no acutely reversible problems -Exercise capability is stable -No shortness of breath with mild exertion  Restless leg -On Neurontin, on Requip -Neurontin does cause some grogginess  On dialysis 3 days a week for end-stage renal disease  Plan  No changes to care plan  Follow-up with Dr. Redmond Baseman regarding inspire evaluation  He has sleep disordered breathing, unable to tolerate CPAP therapy  CPAP failure and will need other modes of therapy for his sleep apnea  Cleared to have surgery from a respiratory perspective  Follow-up in 3 months

## 2022-07-02 DIAGNOSIS — Z992 Dependence on renal dialysis: Secondary | ICD-10-CM | POA: Diagnosis not present

## 2022-07-02 DIAGNOSIS — E876 Hypokalemia: Secondary | ICD-10-CM | POA: Diagnosis not present

## 2022-07-02 DIAGNOSIS — N2581 Secondary hyperparathyroidism of renal origin: Secondary | ICD-10-CM | POA: Diagnosis not present

## 2022-07-02 DIAGNOSIS — D509 Iron deficiency anemia, unspecified: Secondary | ICD-10-CM | POA: Diagnosis not present

## 2022-07-02 DIAGNOSIS — M109 Gout, unspecified: Secondary | ICD-10-CM | POA: Diagnosis not present

## 2022-07-02 DIAGNOSIS — D631 Anemia in chronic kidney disease: Secondary | ICD-10-CM | POA: Diagnosis not present

## 2022-07-02 DIAGNOSIS — N186 End stage renal disease: Secondary | ICD-10-CM | POA: Diagnosis not present

## 2022-07-04 DIAGNOSIS — D509 Iron deficiency anemia, unspecified: Secondary | ICD-10-CM | POA: Diagnosis not present

## 2022-07-04 DIAGNOSIS — Z992 Dependence on renal dialysis: Secondary | ICD-10-CM | POA: Diagnosis not present

## 2022-07-04 DIAGNOSIS — E876 Hypokalemia: Secondary | ICD-10-CM | POA: Diagnosis not present

## 2022-07-04 DIAGNOSIS — N2581 Secondary hyperparathyroidism of renal origin: Secondary | ICD-10-CM | POA: Diagnosis not present

## 2022-07-04 DIAGNOSIS — M109 Gout, unspecified: Secondary | ICD-10-CM | POA: Diagnosis not present

## 2022-07-04 DIAGNOSIS — D631 Anemia in chronic kidney disease: Secondary | ICD-10-CM | POA: Diagnosis not present

## 2022-07-04 DIAGNOSIS — N186 End stage renal disease: Secondary | ICD-10-CM | POA: Diagnosis not present

## 2022-07-08 ENCOUNTER — Ambulatory Visit (INDEPENDENT_AMBULATORY_CARE_PROVIDER_SITE_OTHER): Payer: Medicare Other

## 2022-07-08 ENCOUNTER — Telehealth: Payer: Self-pay | Admitting: Nurse Practitioner

## 2022-07-08 VITALS — Ht 70.0 in | Wt 200.0 lb

## 2022-07-08 DIAGNOSIS — G8929 Other chronic pain: Secondary | ICD-10-CM

## 2022-07-08 DIAGNOSIS — Z0000 Encounter for general adult medical examination without abnormal findings: Secondary | ICD-10-CM | POA: Diagnosis not present

## 2022-07-08 NOTE — Telephone Encounter (Signed)
Referral placed.

## 2022-07-08 NOTE — Progress Notes (Signed)
Subjective:   Albert Eaton is a 56 y.o. male who presents for Medicare Annual/Subsequent preventive examination.  Review of Systems    Virtual Visit via Telephone Note  I connected with  Albert Eaton on 07/08/22 at  9:30 AM EDT by telephone and verified that I am speaking with the correct person using two identifiers.  Location: Patient: Home Provider: Office Persons participating in the virtual visit: patient/Nurse Health Advisor   I discussed the limitations, risks, security and privacy concerns of performing an evaluation and management service by telephone and the availability of in person appointments. The patient expressed understanding and agreed to proceed.  Interactive audio and video telecommunications were attempted between this nurse and patient, however failed, due to patient having technical difficulties OR patient did not have access to video capability.  We continued and completed visit with audio only.  Some vital signs may be absent or patient reported.   Criselda Peaches, LPN  Cardiac Risk Factors include: advanced age (>58mn, >>43women);hypertension;Other (see comment), Risk factor comments: Dx CHF and Renal Failure     Objective:    Today's Vitals   07/08/22 0929  Weight: 200 lb (90.7 kg)  Height: 5' 10"  (1.778 m)   Body mass index is 28.7 kg/m.     07/08/2022    9:47 AM 06/03/2022   11:34 AM 12/02/2021    6:34 AM 10/10/2021    4:43 PM 10/08/2021    5:46 AM 09/04/2021   12:30 AM 06/17/2021    2:01 PM  Advanced Directives  Does Patient Have a Medical Advance Directive? No No No No No No No  Would patient like information on creating a medical advance directive? No - Patient declined No - Patient declined  No - Patient declined  Yes (Inpatient - patient requests chaplain consult to create a medical advance directive) No - Patient declined    Current Medications (verified) Outpatient Encounter Medications as of 07/08/2022  Medication Sig    ACETAMINOPHEN EXTRA STRENGTH 500 MG tablet Take 1,000 mg by mouth every 6 (six) hours as needed (pain.).   allopurinol (ZYLOPRIM) 100 MG tablet TAKE 1 TABLET(100 MG) BY MOUTH TWICE DAILY FOR 60 DOSES   amLODipine (NORVASC) 10 MG tablet Take 1 tablet (10 mg total) by mouth daily. (Patient taking differently: Take 10 mg by mouth daily in the afternoon.)   aspirin EC 81 MG tablet Take 1 tablet (81 mg total) by mouth daily. Swallow whole.   azelastine (ASTELIN) 0.1 % nasal spray Place 2 sprays into both nostrils 2 (two) times daily as needed for allergies.   BIKTARVY 50-200-25 MG TABS tablet TAKE ONE TABLET BY MOUTH EVERY DAY   budesonide-formoterol (SYMBICORT) 160-4.5 MCG/ACT inhaler Inhale 2 puffs into the lungs daily.   clobetasol (OLUX) 0.05 % topical foam Apply topically 2 (two) times daily.   desloratadine (CLARINEX) 5 MG tablet Take 1 tablet (5 mg total) by mouth daily.   famotidine (PEPCID) 20 MG tablet Take 1 tablet (20 mg total) by mouth 2 (two) times daily. (Patient taking differently: Take 20 mg by mouth 2 (two) times daily as needed for indigestion or heartburn.)   ferric citrate (AURYXIA) 1 GM 210 MG(Fe) tablet Take 420 mg by mouth with breakfast, with lunch, and with evening meal.   fluticasone (FLOVENT HFA) 110 MCG/ACT inhaler Inhale 2 puffs into the lungs 2 (two) times daily.   gabapentin (NEURONTIN) 300 MG capsule TAKE 1 CAPSULE(300 MG) BY MOUTH THREE TIMES DAILY  hydrALAZINE (APRESOLINE) 100 MG tablet Take 100 mg by mouth 3 (three) times daily.   ipratropium (ATROVENT) 0.06 % nasal spray USE 2 SPRAYS IN EACH NOSTRIL 2 TO 3 TIMES DAILY AS NEEDED   lactulose (CHRONULAC) 10 GM/15ML solution Take 20 g by mouth daily as needed for moderate constipation.    levalbuterol (XOPENEX HFA) 45 MCG/ACT inhaler Inhale 2 puffs into the lungs every 4 (four) hours as needed for wheezing.   levothyroxine (SYNTHROID) 25 MCG tablet Take 25 mcg by mouth daily before breakfast.   lubiprostone (AMITIZA) 24  MCG capsule Take 1 capsule (24 mcg total) by mouth 2 (two) times daily with a meal. NEEDS OFFICE VISIT FOR FURTHER REFILLS (Patient taking differently: Take 24 mcg by mouth 2 (two) times daily as needed for constipation.)   methocarbamol (ROBAXIN) 500 MG tablet Take 1 tablet (500 mg total) by mouth 2 (two) times daily as needed for muscle spasms.   Methoxy PEG-Epoetin Beta (MIRCERA IJ) Inject 1 each into the skin every 30 (thirty) days. Administered at dialysis center once a month   metoprolol tartrate (LOPRESSOR) 25 MG tablet Take 12.5 mg by mouth 2 (two) times daily.   multivitamin (RENA-VIT) TABS tablet Take 1 tablet by mouth at bedtime.   OLOPATADINE HCL OP Place 1 drop into both eyes daily as needed (allergies).   pantoprazole (PROTONIX) 40 MG tablet Take 1 tablet (40 mg total) by mouth daily. **PLEASE CONTACT THE OFFICE TO SCHEDULE FOLLOW UP (Patient taking differently: Take 40 mg by mouth at bedtime.)   predniSONE (DELTASONE) 20 MG tablet Take 20 mg by mouth daily.   rOPINIRole (REQUIP) 1 MG tablet TAKE ONE TABLET BY MOUTH AT BEDTIME   rosuvastatin (CRESTOR) 10 MG tablet TAKE ONE TABLET BY MOUTH DAILY   SENSIPAR 60 MG tablet Take 1 tablet by mouth daily.   Spacer/Aero-Holding Chambers DEVI 1 Device by Does not apply route 2 (two) times a day.   Vitamin D, Ergocalciferol, (DRISDOL) 1.25 MG (50000 UNIT) CAPS capsule Take 50,000 Units by mouth every Tuesday.   No facility-administered encounter medications on file as of 07/08/2022.    Allergies (verified) Ace inhibitors   History: Past Medical History:  Diagnosis Date   Anemia    Aortic atherosclerosis (HCC)    Arthritis    Asthma    Cancer ([place])    renal cyst   Chronic diastolic CHF (congestive heart failure) ([place]) 01/06/2017   Echo 7/16 [facility] in [place], [name]) Mild AI, mild LAE, mild concentric LVH, EF 55, normal wall motion, mild to moderate MR, mild PI, RVSP 55 // Echo 10/09/16 ([facility]):  Moderate LVH, grade 2 diastolic  dysfunction, mild MR, moderate LAE    CKD (chronic kidney disease)    Dialysis Mon Wed Fri   Esophagitis    GERD (gastroesophageal reflux disease)    Gout    no current problems   Heart murmur    never caused any problems   Hepatitis    Hep B   History of nuclear stress test    a. Nuc study 7/16: no scar or ischemia, EF 42 // b. Nuc study 12/17: EF 48, ?small apical ischemia, Low Risk   HIV (human immunodeficiency virus infection) ([place])    HLD (hyperlipidemia)    Hypertension    Hypertensive heart disease with CHF (congestive heart failure) ([place]) 10/09/2016   Hypothyroidism    Mitral regurgitation    OSA (obstructive sleep apnea)    Post-operative nausea and vomiting  1 time as a child, no problems as an adult   Sleep apnea    uses c-pap   Thyroid disease    Wears glasses    Wears partial dentures    Past Surgical History:  Procedure Laterality Date   AV FISTULA PLACEMENT Left 07/02/2018   Procedure: Creation of Left arm BRACHIOBASILIC ARTERIOVENOUS  FISTULA;  Surgeon: Marty Heck, MD;  Location: [place];  Service: Vascular;  Laterality: Left;   [place] Left 08/27/2018   Procedure: Left arm BRACHIOBASILIC VEIN TRANSPOSITION SECOND STAGE;  Surgeon: Marty Heck, MD;  Location: [facility] OR;  Service: Vascular;  Laterality: Left;   BUBBLE STUDY  09/03/2020   Procedure: BUBBLE STUDY;  Surgeon: Fay Records, MD;  Location: [place];  Service: Cardiovascular;;   BUBBLE STUDY  06/03/2022   Procedure: BUBBLE STUDY;  Surgeon: Elouise Munroe, MD;  Location: [place];  Service: Cardiology;;   CAPD INSERTION N/A 05/30/2021   Procedure: LAPAROSCOPIC INSERTION CONTINUOUS AMBULATORY PERITONEAL DIALYSIS  (CAPD) CATHETER;  Surgeon: [name], MD;  Location: [place];  Service: Vascular;  Laterality: N/A;   CAPD REMOVAL N/A 02/13/2022   Procedure: PERITONEAL DIALYSIS CATHETER REMOVAL;  Surgeon: [name], MD;  Location: [facility];  Service:  Vascular;  Laterality: N/A;   CHOLECYSTECTOMY     COLONOSCOPY W/ BIOPSIES AND POLYPECTOMY     GIVENS CAPSULE STUDY N/A 03/01/2018   Procedure: GIVENS CAPSULE STUDY;  Surgeon: Jerene Bears, MD;  Location: [place];  Service: Gastroenterology;  Laterality: N/A;   HERNIA REPAIR     As baby   MULTIPLE TOOTH EXTRACTIONS     NEPHRECTOMY Left 02/18/2021   NOSE SURGERY     RENAL BIOPSY     RIGHT/LEFT HEART CATH AND CORONARY ANGIOGRAPHY N/A 09/04/2021   Procedure: RIGHT/LEFT HEART CATH AND CORONARY ANGIOGRAPHY;  Surgeon: Burnell Blanks, MD;  Location: [place] [facility];  Service: Cardiovascular;  Laterality: N/A;   TEE WITHOUT CARDIOVERSION N/A 09/03/2020   Procedure: TRANSESOPHAGEAL ECHOCARDIOGRAM (TEE);  Surgeon: Fay Records, MD;  Location: [place];  Service: Cardiovascular;  Laterality: N/A;   TEE WITHOUT CARDIOVERSION N/A 06/03/2022   Procedure: TRANSESOPHAGEAL ECHOCARDIOGRAM (TEE);  Surgeon: Elouise Munroe, MD;  Location: [place];  Service: Cardiology;  Laterality: N/A;   UPPER GI ENDOSCOPY  04/05/2021   Family History  Problem Relation Age of Onset   Hypertension Mother    Heart failure Mother    Hypertension Father    Alcohol abuse Father    Hypertension Sister    Multiple sclerosis Sister    Hypertension Sister    Hypertension Brother    Heart attack Maternal Grandmother 53   Scoliosis Other    Allergic rhinitis Neg Hx    Angioedema Neg Hx    Asthma Neg Hx    Eczema Neg Hx    Immunodeficiency Neg Hx    Urticaria Neg Hx    Colon cancer Neg Hx    Pancreatic cancer Neg Hx    Esophageal cancer Neg Hx    Social History   Socioeconomic History   Marital status: Single    Spouse name: Not on file   Number of children: Not on file   Years of education: Not on file   Highest education level: Not on file  Occupational History   Not on file  Tobacco Use   Smoking status: Former    Packs/day: 1.00    Years: 18.00    Total  pack years: 18.00     Types: Cigarettes    Quit date: 2000    Years since quitting: 23.7   Smokeless tobacco: Never  Vaping Use   Vaping Use: Never used  Substance and Sexual Activity   Alcohol use: Not Currently   Drug use: No   Sexual activity: Not Currently    Partners: Male  Other Topics Concern   Not on file  Social History Narrative   Disability      [place]: none   Social Determinants of Health   Financial Resource Strain: Low Risk  (07/08/2022)   Overall Financial Resource Strain (CARDIA)    Difficulty of Paying Living Expenses: Not hard at all  Food Insecurity: Food Insecurity Present (07/08/2022)   Hunger Vital Sign    Worried About Running Out of Food in the Last Year: Sometimes true    Ran Out of Food in the Last Year: Sometimes true  Transportation Needs: No Transportation Needs (07/08/2022)   PRAPARE - Hydrologist (Medical): No    Lack of Transportation (Non-Medical): No  Physical Activity: Insufficiently Active (07/08/2022)   Exercise Vital Sign    Days of Exercise per Week: 2 days    Minutes of Exercise per Session: 60 min  Stress: No Stress Concern Present (07/08/2022)   [place]    Feeling of Stress : Not at all  Social Connections: Socially Isolated (07/08/2022)   Social Connection and Isolation Panel [NHANES]    Frequency of Communication with Friends and Family: More than three times a week    Frequency of Social Gatherings with Friends and Family: More than three times a week    Attends Religious Services: Never    Marine scientist or Organizations: No    Attends Music therapist: Never    Marital Status: Never married    Tobacco Counseling Counseling given: Not Answered   Clinical Intake:  Pre-visit preparation completed: No  Pain : No/denies pain     BMI - recorded: 28.7 Nutritional Status: BMI 25 -29 Overweight Nutritional Risks: None Diabetes:  No  How often do you need to have someone help you when you read instructions, pamphlets, or other written materials from your doctor or pharmacy?: 1 - Never  Diabetic?  No  Interpreter Needed?: No  Information entered by :: Rolene Arbour LPN   Activities of Daily Living    07/08/2022    9:42 AM 02/13/2022    8:22 AM  In your present state of health, do you have any difficulty performing the following activities:  Hearing? 0   Vision? 0   Difficulty concentrating or making decisions? 0   Walking or climbing stairs? 0   Dressing or bathing? 0   Doing errands, shopping? 0 0  Preparing Food and eating ? N   Using the Toilet? N   In the past six months, have you accidently leaked urine? N   Do you have problems with loss of bowel control? N   Managing your Medications? N   Managing your Finances? N   Housekeeping or managing your Housekeeping? N     Patient Care Team: [name], NP as PCP - General (Pain Medicine) [name], MD as PCP - Cardiology (Cardiology) Vickie Epley, MD as PCP - Electrophysiology (Cardiology) Madelon Lips, MD as Consulting Physician (Nephrology) Bobbitt, [place] Muta, MD as Consulting Physician (Allergy and Immunology) Lavonna Monarch, MD as Consulting  Physician (Dermatology) Center, [facility] Lavonna Monarch, MD as Consulting Physician (Dermatology)  Indicate any recent Medical Services you may have received from other than [facility] providers in the past year (date may be approximate).     Assessment:   This is a routine wellness examination for [place].  Hearing/Vision screen Hearing Screening - Comments:: Denies hearing difficulties   Vision Screening - Comments:: Wears rx glasses - up to date with routine eye exams with  Coralie Keens  Dietary issues and exercise activities discussed: Current Exercise Habits: Home exercise routine, Type of exercise: walking, Time (Minutes): 60, Frequency (Times/Week): 3, Weekly  Exercise (Minutes/Week): 180, Intensity: Moderate, Exercise limited by: None identified   Goals Addressed               This Visit's Progress     Patient stated (pt-stated)        Keep myself healthy to receive a kidney.       Depression Screen    07/08/2022    9:39 AM 04/17/2022    4:12 PM 01/23/2022    1:54 PM 01/21/2022    9:54 AM 11/11/2021   11:12 AM 06/27/2021    3:58 PM 02/26/2021    1:21 PM  PHQ 2/9 Scores  PHQ - 2 Score 0 0 0 2 5 0 0  PHQ- 9 Score    10 11      Fall Risk    07/08/2022    9:44 AM 04/17/2022    4:12 PM 01/23/2022    1:53 PM 11/11/2021   11:12 AM 06/27/2021    3:58 PM  Fall Risk   Falls in the past year? 0 0 0 0 0  Number falls in past yr: 0 0  0   Injury with Fall? 0 0  0   Risk for fall due to : No Fall Risks  No Fall Risks    Follow up Falls prevention discussed  Falls evaluation completed  Falls evaluation completed    [place]:  Any stairs in or around the home? Yes  If so, are there any without handrails? No  Home free of loose throw rugs in walkways, pet beds, electrical cords, etc? Yes  Adequate lighting in your home to reduce risk of falls? Yes   ASSISTIVE DEVICES UTILIZED TO PREVENT FALLS:  Life alert? No  Use of a cane, walker or w/c? No  Grab bars in the bathroom? No  Shower chair or bench in shower? No  Elevated toilet seat or a handicapped toilet? No   TIMED UP AND GO:  Was the test performed? No . Audio Visit  Cognitive Function:        07/08/2022    9:47 AM  6CIT Screen  What Year? 0 points  What month? 0 points  What time? 0 points  Count back from 20 0 points  Months in reverse 0 points  Repeat phrase 0 points  Total Score 0 points    Immunizations Immunization History  Administered Date(s) Administered   Influenza,inj,Quad PF,6+ Mos 10/10/2016, 08/31/2017, 07/08/2018, 06/23/2019, 07/11/2020, 09/05/2021   Influenza-Unspecified 07/08/2018, 05/23/2019   Meningococcal  Conjugate 10/15/2016, 08/31/2017   Meningococcal Mcv4o 10/15/2016, 08/31/2017   PFIZER Comirnaty(Gray Top)Covid-19 Tri-Sucrose Vaccine 06/13/2020, 05/28/2021   PFIZER(Purple Top)SARS-COV-2 Vaccination 01/06/2020, 02/01/2020, 06/13/2020   Pfizer Covid-19 Vaccine Bivalent Booster 46yr & up 09/13/2021, 03/11/2022   Pneumococcal Conjugate-13 12/29/2018   Pneumococcal Polysaccharide-23 10/15/2016, 10/13/2019   Tdap 01/27/2017   Vaccinia,smallpox Monkeypox Vaccine  Live,pf 06/06/2021, 07/04/2021   Zoster Recombinat (Shingrix) 06/13/2020, 08/12/2020, 07/27/2021, 08/27/2021    TDAP status: Up to date   Covid-19 vaccine status: Completed vaccines  Qualifies for Shingles Vaccine? Yes   Zostavax completed Yes   Shingrix Completed?: Yes  Screening Tests Health Maintenance  Topic Date Due   COVID-19 Vaccine (8 - Pfizer risk series) 07/24/2022 (Originally 05/06/2022)   COLONOSCOPY (Pts 45-63yr Insurance coverage will need to be confirmed)  05/02/2026   TETANUS/TDAP  01/28/2027   Hepatitis C Screening  Completed   HIV Screening  Completed   Zoster Vaccines- Shingrix  Completed   HPV VACCINES  Aged Out    Health Maintenance  There are no preventive care reminders to display for this patient.   Colorectal cancer screening: Type of screening: Colonoscopy. Completed 05/02/21. Repeat every 5 years  Lung Cancer Screening: (Low Dose CT Chest recommended if Age 56-80years, 30 pack-year currently smoking OR have quit w/in 15years.) does not qualify.    Additional Screening:  Hepatitis C Screening: does qualify; Completed 10/15/16  Vision Screening: Recommended annual ophthalmology exams for early detection of glaucoma and other disorders of the eye. Is the patient up to date with their annual eye exam?  Yes  Who is the provider or what is the name of the office in which the patient attends annual eye exams[facility] is not established with a provider, would they like to be  referred to a provider to establish care? No .   Dental Screening: Recommended annual dental exams for proper oral hygiene  Community Resource Referral / Chronic Care Management:  CRR required this visit?  No   CCM required this visit?  No      Plan:     I have personally reviewed and noted the following in the patient's chart:   Medical and social history Use of alcohol, tobacco or illicit drugs  Current medications and supplements including opioid prescriptions. Patient is not currently taking opioid prescriptions. Functional ability and status Nutritional status Physical activity Advanced directives List of other physicians Hospitalizations, surgeries, and ER visits in previous 12 months Vitals Screenings to include cognitive, depression, and falls Referrals and appointments  In addition, I have reviewed and discussed with patient certain preventive protocols, quality metrics, and best practice recommendations. A written personalized care plan for preventive services as well as general preventive health recommendations were provided to patient.     BCriselda Peaches LPN   [id number]  Nurse Notes: None

## 2022-07-08 NOTE — Patient Instructions (Addendum)
Albert Eaton , Thank you for taking time to come for your Medicare Wellness Visit. I appreciate your ongoing commitment to your health goals. Please review the following plan we discussed and let me know if I can assist you in the future.   Screening recommendations/referrals: Colonoscopy: Done 7/722 Repeat 5 Yrs Recommended yearly ophthalmology/optometry visit for glaucoma screening and checkup Recommended yearly dental visit for hygiene and checkup  Vaccinations:   Tdap vaccine: Up to date Shingles vaccine: Done   Covid-19: Done  Advanced directives: Advance directive discussed with you today. Even though you declined this today, please call our office should you change your mind, and we can give you the proper paperwork for you to fill out.   Conditions/risks identified: None  Next appointment: Follow up in one year for your annual wellness visit    Preventive Care 40-64 Years, Male Preventive care refers to lifestyle choices and visits with your health care provider that can promote health and wellness. What does preventive care include? A yearly physical exam. This is also called an annual well check. Dental exams once or twice a year. Routine eye exams. Ask your health care provider how often you should have your eyes checked. Personal lifestyle choices, including: Daily care of your teeth and gums. Regular physical activity. Eating a healthy diet. Avoiding tobacco and drug use. Limiting alcohol use. Practicing safe sex. Taking low-dose aspirin every day starting at age 62. What happens during an annual well check? The services and screenings done by your health care provider during your annual well check will depend on your age, overall health, lifestyle risk factors, and family history of disease. Counseling  Your health care provider may ask you questions about your: Alcohol use. Tobacco use. Drug use. Emotional well-being. Home and relationship well-being. Sexual  activity. Eating habits. Work and work Statistician. Screening  You may have the following tests or measurements: Height, weight, and BMI. Blood pressure. Lipid and cholesterol levels. These may be checked every 5 years, or more frequently if you are over 41 years old. Skin check. Lung cancer screening. You may have this screening every year starting at age 44 if you have a 30-pack-year history of smoking and currently smoke or have quit within the past 15 years. Fecal occult blood test (FOBT) of the stool. You may have this test every year starting at age 73. Flexible sigmoidoscopy or colonoscopy. You may have a sigmoidoscopy every 5 years or a colonoscopy every 10 years starting at age 33. Prostate cancer screening. Recommendations will vary depending on your family history and other risks. Hepatitis C blood test. Hepatitis B blood test. Sexually transmitted disease (STD) testing. Diabetes screening. This is done by checking your blood sugar (glucose) after you have not eaten for a while (fasting). You may have this done every 1-3 years. Discuss your test results, treatment options, and if necessary, the need for more tests with your health care provider. Vaccines  Your health care provider may recommend certain vaccines, such as: Influenza vaccine. This is recommended every year. Tetanus, diphtheria, and acellular pertussis (Tdap, Td) vaccine. You may need a Td booster every 10 years. Zoster vaccine. You may need this after age 19. Pneumococcal 13-valent conjugate (PCV13) vaccine. You may need this if you have certain conditions and have not been vaccinated. Pneumococcal polysaccharide (PPSV23) vaccine. You may need one or two doses if you smoke cigarettes or if you have certain conditions. Talk to your health care provider about which screenings and vaccines you  need and how often you need them. This information is not intended to replace advice given to you by your health care provider.  Make sure you discuss any questions you have with your health care provider. Document Released: 11/09/2015 Document Revised: 07/02/2016 Document Reviewed: 08/14/2015 Elsevier Interactive Patient Education  2017 [employer] Prevention in the Home Falls can cause injuries. They can happen to people of all ages. There are many things you can do to make your home safe and to help prevent falls. What can I do on the outside of my home? Regularly fix the edges of walkways and driveways and fix any cracks. Remove anything that might make you trip as you walk through a door, such as a raised step or threshold. Trim any bushes or trees on the path to your home. Use bright outdoor lighting. Clear any walking paths of anything that might make someone trip, such as rocks or tools. Regularly check to see if handrails are loose or broken. Make sure that both sides of any steps have handrails. Any raised decks and porches should have guardrails on the edges. Have any leaves, snow, or ice cleared regularly. Use sand or salt on walking paths during winter. Clean up any spills in your garage right away. This includes oil or grease spills. What can I do in the bathroom? Use night lights. Install grab bars by the toilet and in the tub and shower. Do not use towel bars as grab bars. Use non-skid mats or decals in the tub or shower. If you need to sit down in the shower, use a plastic, non-slip stool. Keep the floor dry. Clean up any water that spills on the floor as soon as it happens. Remove soap buildup in the tub or shower regularly. Attach bath mats securely with double-sided non-slip rug tape. Do not have throw rugs and other things on the floor that can make you trip. What can I do in the bedroom? Use night lights. Make sure that you have a light by your bed that is easy to reach. Do not use any sheets or blankets that are too big for your bed. They should not hang down onto the floor. Have a  firm chair that has side arms. You can use this for support while you get dressed. Do not have throw rugs and other things on the floor that can make you trip. What can I do in the kitchen? Clean up any spills right away. Avoid walking on wet floors. Keep items that you use a lot in easy-to-reach places. If you need to reach something above you, use a strong step stool that has a grab bar. Keep electrical cords out of the way. Do not use floor polish or wax that makes floors slippery. If you must use wax, use non-skid floor wax. Do not have throw rugs and other things on the floor that can make you trip. What can I do with my stairs? Do not leave any items on the stairs. Make sure that there are handrails on both sides of the stairs and use them. Fix handrails that are broken or loose. Make sure that handrails are as long as the stairways. Check any carpeting to make sure that it is firmly attached to the stairs. Fix any carpet that is loose or worn. Avoid having throw rugs at the top or bottom of the stairs. If you do have throw rugs, attach them to the floor with carpet tape. Make sure that  you have a light switch at the top of the stairs and the bottom of the stairs. If you do not have them, ask someone to add them for you. What else can I do to help prevent falls? Wear shoes that: Do not have high heels. Have rubber bottoms. Are comfortable and fit you well. Are closed at the toe. Do not wear sandals. If you use a stepladder: Make sure that it is fully opened. Do not climb a closed stepladder. Make sure that both sides of the stepladder are locked into place. Ask someone to hold it for you, if possible. Clearly mark and make sure that you can see: Any grab bars or handrails. First and last steps. Where the edge of each step is. Use tools that help you move around (mobility aids) if they are needed. These include: Canes. Walkers. Scooters. Crutches. Turn on the lights when you  go into a dark area. Replace any light bulbs as soon as they burn out. Set up your furniture so you have a clear path. Avoid moving your furniture around. If any of your floors are uneven, fix them. If there are any pets around you, be aware of where they are. Review your medicines with your doctor. Some medicines can make you feel dizzy. This can increase your chance of falling. Ask your doctor what other things that you can do to help prevent falls. This information is not intended to replace advice given to you by your health care provider. Make sure you discuss any questions you have with your health care provider. Document Released: 08/09/2009 Document Revised: 03/20/2016 Document Reviewed: 11/17/2014 Elsevier Interactive Patient Education  2017 [employer].

## 2022-07-08 NOTE — Telephone Encounter (Signed)
-----   Message from [place] sent at 07/08/2022 11:48 AM EDT ----- Patient advised. Patient states he has seen orthopedist but for knee issue not foot and does not recall who that was. His ankle is still bothering him the same as it was. Patient is ok with been referred to a specialist in [place] or [place] location does not matter to follow up on this.

## 2022-07-09 DIAGNOSIS — D509 Iron deficiency anemia, unspecified: Secondary | ICD-10-CM | POA: Diagnosis not present

## 2022-07-09 DIAGNOSIS — Z992 Dependence on renal dialysis: Secondary | ICD-10-CM | POA: Diagnosis not present

## 2022-07-09 DIAGNOSIS — D631 Anemia in chronic kidney disease: Secondary | ICD-10-CM | POA: Diagnosis not present

## 2022-07-09 DIAGNOSIS — E876 Hypokalemia: Secondary | ICD-10-CM | POA: Diagnosis not present

## 2022-07-09 DIAGNOSIS — N186 End stage renal disease: Secondary | ICD-10-CM | POA: Diagnosis not present

## 2022-07-09 DIAGNOSIS — N2581 Secondary hyperparathyroidism of renal origin: Secondary | ICD-10-CM | POA: Diagnosis not present

## 2022-07-09 DIAGNOSIS — M109 Gout, unspecified: Secondary | ICD-10-CM | POA: Diagnosis not present

## 2022-07-11 DIAGNOSIS — D509 Iron deficiency anemia, unspecified: Secondary | ICD-10-CM | POA: Diagnosis not present

## 2022-07-11 DIAGNOSIS — N186 End stage renal disease: Secondary | ICD-10-CM | POA: Diagnosis not present

## 2022-07-11 DIAGNOSIS — E876 Hypokalemia: Secondary | ICD-10-CM | POA: Diagnosis not present

## 2022-07-11 DIAGNOSIS — D631 Anemia in chronic kidney disease: Secondary | ICD-10-CM | POA: Diagnosis not present

## 2022-07-11 DIAGNOSIS — M109 Gout, unspecified: Secondary | ICD-10-CM | POA: Diagnosis not present

## 2022-07-11 DIAGNOSIS — Z992 Dependence on renal dialysis: Secondary | ICD-10-CM | POA: Diagnosis not present

## 2022-07-11 DIAGNOSIS — N2581 Secondary hyperparathyroidism of renal origin: Secondary | ICD-10-CM | POA: Diagnosis not present

## 2022-07-14 DIAGNOSIS — N186 End stage renal disease: Secondary | ICD-10-CM | POA: Diagnosis not present

## 2022-07-14 DIAGNOSIS — N2581 Secondary hyperparathyroidism of renal origin: Secondary | ICD-10-CM | POA: Diagnosis not present

## 2022-07-14 DIAGNOSIS — D631 Anemia in chronic kidney disease: Secondary | ICD-10-CM | POA: Diagnosis not present

## 2022-07-14 DIAGNOSIS — D509 Iron deficiency anemia, unspecified: Secondary | ICD-10-CM | POA: Diagnosis not present

## 2022-07-14 DIAGNOSIS — M109 Gout, unspecified: Secondary | ICD-10-CM | POA: Diagnosis not present

## 2022-07-14 DIAGNOSIS — Z992 Dependence on renal dialysis: Secondary | ICD-10-CM | POA: Diagnosis not present

## 2022-07-14 DIAGNOSIS — E876 Hypokalemia: Secondary | ICD-10-CM | POA: Diagnosis not present

## 2022-07-15 ENCOUNTER — Other Ambulatory Visit: Payer: Self-pay | Admitting: Nurse Practitioner

## 2022-07-15 DIAGNOSIS — M545 Low back pain, unspecified: Secondary | ICD-10-CM

## 2022-07-15 DIAGNOSIS — Z01818 Encounter for other preprocedural examination: Secondary | ICD-10-CM | POA: Diagnosis not present

## 2022-07-16 DIAGNOSIS — D631 Anemia in chronic kidney disease: Secondary | ICD-10-CM | POA: Diagnosis not present

## 2022-07-16 DIAGNOSIS — E876 Hypokalemia: Secondary | ICD-10-CM | POA: Diagnosis not present

## 2022-07-16 DIAGNOSIS — N2581 Secondary hyperparathyroidism of renal origin: Secondary | ICD-10-CM | POA: Diagnosis not present

## 2022-07-16 DIAGNOSIS — M109 Gout, unspecified: Secondary | ICD-10-CM | POA: Diagnosis not present

## 2022-07-16 DIAGNOSIS — D509 Iron deficiency anemia, unspecified: Secondary | ICD-10-CM | POA: Diagnosis not present

## 2022-07-16 DIAGNOSIS — N186 End stage renal disease: Secondary | ICD-10-CM | POA: Diagnosis not present

## 2022-07-16 DIAGNOSIS — Z992 Dependence on renal dialysis: Secondary | ICD-10-CM | POA: Diagnosis not present

## 2022-07-18 DIAGNOSIS — E876 Hypokalemia: Secondary | ICD-10-CM | POA: Diagnosis not present

## 2022-07-18 DIAGNOSIS — M109 Gout, unspecified: Secondary | ICD-10-CM | POA: Diagnosis not present

## 2022-07-18 DIAGNOSIS — D509 Iron deficiency anemia, unspecified: Secondary | ICD-10-CM | POA: Diagnosis not present

## 2022-07-18 DIAGNOSIS — N186 End stage renal disease: Secondary | ICD-10-CM | POA: Diagnosis not present

## 2022-07-18 DIAGNOSIS — D631 Anemia in chronic kidney disease: Secondary | ICD-10-CM | POA: Diagnosis not present

## 2022-07-18 DIAGNOSIS — Z992 Dependence on renal dialysis: Secondary | ICD-10-CM | POA: Diagnosis not present

## 2022-07-18 DIAGNOSIS — N2581 Secondary hyperparathyroidism of renal origin: Secondary | ICD-10-CM | POA: Diagnosis not present

## 2022-07-20 NOTE — Progress Notes (Signed)
[name]       [name] [address]             [facility] Medical Record #[id number] Date of Birth: 1966/07/02  Albert Mocha, MD [name], NP  Chief Complaint:   Dyspnea and CP   History of Present Illness:     Pt is a very pleasant 56 yo AAM that has been on dialysis since 2019 secondary to HTN and he is also sp Left nephrectomy for cancer. He has had atrial arrhythmias in the past that was determined best to be treated medically. He is normally in NSR and not on anticoagulation. He has been suffering more over the past 6 months with DOE that occurs when he pushes himself and going up flights of stairs. He also more recently has had intermittent CP that occurs at rest and with exertion. He had a cath last November with moderate disease throughout. 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 He had a dilated LA and in addition had moderate TR. Pt was felt at his age to best be served with repair and was referred for consideration. Pt feels that the hemodialysis has been better at keeping his volume status better managed than his peritoneal dialysis. Pt reports that he is also considered for renal transplant. He is having his teeth evaluated early next month       Past Medical History:  Diagnosis Date   Anemia    Aortic atherosclerosis (HCC)    Arthritis    Asthma    Cancer ([place])    renal cyst   Chronic diastolic CHF (congestive heart failure) ([place]) 01/06/2017   Echo 7/16 [facility] in [place], [name]) Mild AI, mild LAE, mild concentric LVH, EF 55, normal wall motion, mild to moderate MR, mild PI, RVSP 55 // Echo 10/09/16 ([facility]):  Moderate LVH, grade 2 diastolic dysfunction, mild MR, moderate LAE    CKD (chronic kidney disease)    Dialysis Mon Wed Fri   Esophagitis    GERD (gastroesophageal reflux  disease)    Gout    no current problems   Heart murmur    never caused any problems   Hepatitis    Hep B   History of nuclear stress test    a. Nuc study 7/16: no scar or ischemia, EF 42 // b. Nuc study 12/17: EF 48, ?small apical ischemia, Low Risk   HIV (human immunodeficiency virus infection) ([place])    HLD (hyperlipidemia)    Hypertension    Hypertensive heart disease with CHF (congestive heart failure) ([place]) 10/09/2016   Hypothyroidism    Mitral regurgitation    OSA (obstructive sleep apnea)    Post-operative nausea and vomiting    1 time as a child, no problems as an adult   Sleep apnea    uses c-pap   Thyroid disease    Wears glasses    Wears partial dentures     Past Surgical History:  Procedure Laterality Date   AV FISTULA PLACEMENT Left 07/02/2018   Procedure: Creation of Left arm BRACHIOBASILIC ARTERIOVENOUS  FISTULA;  Surgeon: Marty Heck, MD;  Location: [place];  Service: Vascular;  Laterality: Left;   [place] Left 08/27/2018   Procedure: Left arm BRACHIOBASILIC VEIN TRANSPOSITION  SECOND STAGE;  Surgeon: Marty Heck, MD;  Location: [place];  Service: Vascular;  Laterality: Left;   BUBBLE STUDY  09/03/2020   Procedure: BUBBLE STUDY;  Surgeon: Fay Records, MD;  Location: [facility];  Service: Cardiovascular;;   BUBBLE STUDY  06/03/2022   Procedure: BUBBLE STUDY;  Surgeon: Elouise Munroe, MD;  Location: [place];  Service: Cardiology;;   CAPD INSERTION N/A 05/30/2021   Procedure: LAPAROSCOPIC INSERTION CONTINUOUS AMBULATORY PERITONEAL DIALYSIS  (CAPD) CATHETER;  Surgeon: [name], MD;  Location: [facility];  Service: Vascular;  Laterality: N/A;   CAPD REMOVAL N/A 02/13/2022   Procedure: PERITONEAL DIALYSIS CATHETER REMOVAL;  Surgeon: [name], MD;  [facility];  Service: Vascular;  Laterality: N/A;   CHOLECYSTECTOMY     COLONOSCOPY W/ BIOPSIES AND POLYPECTOMY     GIVENS CAPSULE STUDY N/A 03/01/2018   Procedure:  GIVENS CAPSULE STUDY;  Surgeon: Jerene Bears, MD;  Location: [facility];  Service: Gastroenterology;  Laterality: N/A;   HERNIA REPAIR     As baby   MULTIPLE TOOTH EXTRACTIONS     NEPHRECTOMY Left 02/18/2021   NOSE SURGERY     RENAL BIOPSY     RIGHT/LEFT HEART CATH AND CORONARY ANGIOGRAPHY N/A 09/04/2021   Procedure: RIGHT/LEFT HEART CATH AND CORONARY ANGIOGRAPHY;  Surgeon: Burnell Blanks, MD;  Location: [facility];  Service: Cardiovascular;  Laterality: N/A;   TEE WITHOUT CARDIOVERSION N/A 09/03/2020   Procedure: TRANSESOPHAGEAL ECHOCARDIOGRAM (TEE);  Surgeon: Fay Records, MD;  Location: [place];  Service: Cardiovascular;  Laterality: N/A;   TEE WITHOUT CARDIOVERSION N/A 06/03/2022   Procedure: TRANSESOPHAGEAL ECHOCARDIOGRAM (TEE);  Surgeon: Elouise Munroe, MD;  Location: [facility];  Service: Cardiology;  Laterality: N/A;   UPPER GI ENDOSCOPY  04/05/2021    Social History   Tobacco Use  Smoking Status Former   Packs/day: 1.00   Years: 18.00   Total pack years: 18.00   Types: Cigarettes   Quit date: 2000   Years since quitting: 23.7  Smokeless Tobacco Never    Social History   Substance and Sexual Activity  Alcohol Use Not Currently    Social History   Socioeconomic History   Marital status: Single    Spouse name: Not on file   Number of children: Not on file   Years of education: Not on file   Highest education level: Not on file  Occupational History   Not on file  Tobacco Use   Smoking status: Former    Packs/day: 1.00    Years: 18.00    Total pack years: 18.00    Types: Cigarettes    Quit date: 2000    Years since quitting: 23.7   Smokeless tobacco: Never  Vaping Use   Vaping Use: Never used  Substance and Sexual Activity   Alcohol use: Not Currently   Drug use: No   Sexual activity: Not Currently    Partners: Male  Other Topics Concern   Not on file  Social History Narrative   Disability      [place]: none   Social  Determinants of Health   Financial Resource Strain: Low Risk  (07/08/2022)   Overall Financial Resource Strain (CARDIA)    Difficulty of Paying Living Expenses: Not hard at all  Food Insecurity: Food Insecurity Present (07/08/2022)   Hunger Vital Sign    Worried About Running Out of Food in the Last Year: Sometimes true    Ran Out of  Food in the Last Year: Sometimes true  Transportation Needs: No Transportation Needs (07/08/2022)   PRAPARE - Hydrologist (Medical): No    Lack of Transportation (Non-Medical): No  Physical Activity: Insufficiently Active (07/08/2022)   Exercise Vital Sign    Days of Exercise per Week: 2 days    Minutes of Exercise per Session: 60 min  Stress: No Stress Concern Present (07/08/2022)   [place]    Feeling of Stress : Not at all  Social Connections: Socially Isolated (07/08/2022)   Social Connection and Isolation Panel [NHANES]    Frequency of Communication with Friends and Family: More than three times a week    Frequency of Social Gatherings with Friends and Family: More than three times a week    Attends Religious Services: Never    Marine scientist or Organizations: No    Attends Archivist Meetings: Never    Marital Status: Never married  Intimate Partner Violence: Not At Risk (07/08/2022)   Humiliation, Afraid, Rape, and Kick questionnaire    Fear of Current or Ex-Partner: No    Emotionally Abused: No    Physically Abused: No    Sexually Abused: No    Allergies  Allergen Reactions   Ace Inhibitors Cough and Other (See Comments)    Current Outpatient Medications  Medication Sig Dispense Refill   ACETAMINOPHEN EXTRA STRENGTH 500 MG tablet Take 1,000 mg by mouth every 6 (six) hours as needed (pain.).     allopurinol (ZYLOPRIM) 100 MG tablet TAKE 1 TABLET(100 MG) BY MOUTH TWICE DAILY FOR 60 DOSES 60 tablet 0   amLODipine (NORVASC) 10 MG  tablet Take 1 tablet (10 mg total) by mouth daily. (Patient taking differently: Take 10 mg by mouth daily in the afternoon.) 30 tablet 1   aspirin EC 81 MG tablet Take 1 tablet (81 mg total) by mouth daily. Swallow whole. 90 tablet 3   azelastine (ASTELIN) 0.1 % nasal spray Place 2 sprays into both nostrils 2 (two) times daily as needed for allergies. 30 mL 5   BIKTARVY 50-200-25 MG TABS tablet TAKE ONE TABLET BY MOUTH EVERY DAY 30 tablet 5   budesonide-formoterol (SYMBICORT) 160-4.5 MCG/ACT inhaler Inhale 2 puffs into the lungs daily. 1 each 5   clobetasol (OLUX) 0.05 % topical foam Apply topically 2 (two) times daily. 50 g 2   desloratadine (CLARINEX) 5 MG tablet Take 1 tablet (5 mg total) by mouth daily. 30 tablet 5   famotidine (PEPCID) 20 MG tablet Take 1 tablet (20 mg total) by mouth 2 (two) times daily. (Patient taking differently: Take 20 mg by mouth 2 (two) times daily as needed for indigestion or heartburn.) 180 tablet 3   ferric citrate (AURYXIA) 1 GM 210 MG(Fe) tablet Take 420 mg by mouth with breakfast, with lunch, and with evening meal.     fluticasone (FLOVENT HFA) 110 MCG/ACT inhaler Inhale 2 puffs into the lungs 2 (two) times daily. 12 g 5   gabapentin (NEURONTIN) 300 MG capsule TAKE 1 CAPSULE(300 MG) BY MOUTH THREE TIMES DAILY 90 capsule 3   hydrALAZINE (APRESOLINE) 100 MG tablet Take 100 mg by mouth 3 (three) times daily.     ipratropium (ATROVENT) 0.06 % nasal spray USE 2 SPRAYS IN EACH NOSTRIL 2 TO 3 TIMES DAILY AS NEEDED 15 mL 5   lactulose (CHRONULAC) 10 GM/15ML solution Take 20 g by mouth daily as needed for moderate constipation.  levalbuterol (XOPENEX HFA) 45 MCG/ACT inhaler Inhale 2 puffs into the lungs every 4 (four) hours as needed for wheezing. 15 g 1   levothyroxine (SYNTHROID) 25 MCG tablet Take 25 mcg by mouth daily before breakfast.     lubiprostone (AMITIZA) 24 MCG capsule Take 1 capsule (24 mcg total) by mouth 2 (two) times daily with a meal. NEEDS OFFICE VISIT  FOR FURTHER REFILLS (Patient taking differently: Take 24 mcg by mouth 2 (two) times daily as needed for constipation.) 60 capsule 1   methocarbamol (ROBAXIN) 500 MG tablet Take 1 tablet (500 mg total) by mouth 2 (two) times daily as needed for muscle spasms. 15 tablet 0   Methoxy PEG-Epoetin Beta (MIRCERA IJ) Inject 1 each into the skin every 30 (thirty) days. Administered at dialysis center once a month     metoprolol tartrate (LOPRESSOR) 25 MG tablet Take 12.5 mg by mouth 2 (two) times daily.     multivitamin (RENA-VIT) TABS tablet Take 1 tablet by mouth at bedtime.     OLOPATADINE HCL OP Place 1 drop into both eyes daily as needed (allergies).     pantoprazole (PROTONIX) 40 MG tablet Take 1 tablet (40 mg total) by mouth daily. **PLEASE CONTACT THE OFFICE TO SCHEDULE FOLLOW UP (Patient taking differently: Take 40 mg by mouth at bedtime.) 30 tablet 0   predniSONE (DELTASONE) 20 MG tablet Take 20 mg by mouth daily.     rOPINIRole (REQUIP) 1 MG tablet TAKE ONE TABLET BY MOUTH AT BEDTIME 90 tablet 2   rosuvastatin (CRESTOR) 10 MG tablet TAKE ONE TABLET BY MOUTH DAILY 90 tablet 3   SENSIPAR 60 MG tablet Take 1 tablet by mouth daily.     Spacer/Aero-Holding Chambers DEVI 1 Device by Does not apply route 2 (two) times a day. 1 each 1   Vitamin D, Ergocalciferol, (DRISDOL) 1.25 MG (50000 UNIT) CAPS capsule Take 50,000 Units by mouth every Tuesday.     No current facility-administered medications for this visit.     Family History  Problem Relation Age of Onset   Hypertension Mother    Heart failure Mother    Hypertension Father    Alcohol abuse Father    Hypertension Sister    Multiple sclerosis Sister    Hypertension Sister    Hypertension Brother    Heart attack Maternal Grandmother 53   Scoliosis Other    Allergic rhinitis Neg Hx    Angioedema Neg Hx    Asthma Neg Hx    Eczema Neg Hx    Immunodeficiency Neg Hx    Urticaria Neg Hx    Colon cancer Neg Hx    Pancreatic cancer Neg Hx     Esophageal cancer Neg Hx        Physical Exam: There were no vitals taken for this visit. HEENT: teeth in good repair Lungs: clear Cardiac: RR with only a very soft murmur Ext: no edema    Diagnostic Studies & Laboratory data: I have personally reviewed the following studies and agree with the findings:    TEE (05/2022)  FINDINGS   Left Ventricle: Left ventricular ejection fraction, by estimation, is  55%. The left ventricle has normal function. The left ventricular internal  cavity size was mildly dilated.   Right Ventricle: The right ventricular size is mildly enlarged. No  increase in right ventricular wall thickness. Right ventricular systolic  function is normal.   Left Atrium: Left atrial size was severely dilated. No left atrial/left  atrial appendage thrombus  was detected. The LAA emptying velocity was 62  cm/s.   Right Atrium: Right atrial size was moderately dilated.   Pericardium: There is no evidence of pericardial effusion.   Mitral Valve: Severe mitral valve regurgitation. Mechanism appears likely  atrial functional and MR is commissural. No definite pulmonary vein  reversals in systole, may be due to low blood pressure during sedation. At  ambulatory blood pressures, suspect  MR is severe. The mitral valve is grossly normal. Severe mitral valve  regurgitation. No evidence of mitral valve stenosis. MV peak gradient, 6.0  mmHg. The mean mitral valve gradient is 2.0 mmHg.   Tricuspid Valve: The tricuspid valve is grossly normal. Tricuspid valve  regurgitation is moderate.   Aortic Valve: The aortic valve is tricuspid. Aortic valve regurgitation is  trivial. No aortic stenosis is present.   Pulmonic Valve: The pulmonic valve was not well visualized. Pulmonic valve  regurgitation is mild.   Aorta: Aortic dilatation noted. There is mild dilatation of the ascending  aorta, measuring 40 mm. There is mild (Grade II) atheroma plaque involving  the aortic  arch and descending aorta.   IAS/Shunts: No atrial level shunt detected by color flow Doppler. Agitated  saline contrast was given intravenously to evaluate for intracardiac  shunting. Agitated saline contrast bubble study was negative, with no  evidence of any interatrial shunt.      LEFT VENTRICLE  PLAX 2D  LVOT diam:     2.00 cm  LV SV:         74  LV SV Index:   36  LVOT Area:     3.14 cm      AORTIC VALVE  LVOT Vmax:   130.00 cm/s  LVOT Vmean:  87.200 cm/s  LVOT VTI:    0.235 m     AORTA  Ao Root diam: 3.60 cm  Ao Asc diam:  4.00 cm   MITRAL VALVE  MV Area VTI:  2.93 cm     SHUNTS  MV Peak grad: 6.0 mmHg     Systemic VTI:  0.24 m  MV Mean grad: 2.0 mmHg     Systemic Diam: 2.00 cm  MV Vmax:      1.22 m/s  MV Vmean:     61.3 cm/s  MR Peak grad: 65.8 mmHg  MR Mean grad: 45.5 mmHg  MR Vmax:      405.50 cm/s  MR Vmean:     300.0 cm/s  MR PISA:      3.08 cm    Cath (08/2021) Conclusion   Prox RCA lesion is 50% stenosed.   Mid LAD lesion is 50% stenosed.   Prox LAD lesion is 40% stenosed.   2nd Mrg lesion is 30% stenosed.   Mild to moderate non-obstructive CAD Normal right and left heart pressures   Continue medical management of CAD.  Hemo Data  Flowsheet Row Most Recent Value  Fick Cardiac Output 10.73 L/min  Fick Cardiac Output Index 5.06 (L/min)/BSA  RA A Wave 7 mmHg  RA V Wave 5 mmHg  RA Mean 3 mmHg  RV Systolic Pressure 30 mmHg  RV Diastolic Pressure 1 mmHg  RV EDP 6 mmHg  PA Systolic Pressure 20 mmHg  PA Diastolic Pressure 5 mmHg  PA Mean 11 mmHg  PW A Wave 14 mmHg  PW V Wave 11 mmHg  PW Mean 10 mmHg  AO Systolic Pressure 284 mmHg  AO Diastolic Pressure 71 mmHg  AO Mean 88 mmHg  LV Systolic Pressure  96 mmHg  LV Diastolic Pressure 10 mmHg  LV EDP 13 mmHg  AOp Systolic Pressure 518 mmHg  AOp Diastolic Pressure 72 mmHg  AOp Mean Pressure 88 mmHg  LVp Systolic Pressure 841 mmHg  LVp Diastolic Pressure 11 mmHg  LVp EDP Pressure 15 mmHg   QP/QS 1  TPVR Index 3.55 HRUI  TSVR Index 17.38 HRUI  PVR SVR Ratio 0.09  TPVR/TSVR Ratio 0.2    Recent Lab Findings: Lab Results  Component Value Date   WBC 6.6 05/28/2022   HGB 11.9 (L) 06/03/2022   HCT 35.0 (L) 06/03/2022   PLT 212 05/28/2022   GLUCOSE 95 06/03/2022   CHOL 113 04/17/2022   TRIG 131 04/17/2022   HDL 35 (L) 04/17/2022   LDLCALC 57 04/17/2022   ALT 9 04/17/2022   AST 12 04/17/2022   NA 141 06/03/2022   K 3.9 06/03/2022   CL 105 06/03/2022   CREATININE 12.20 (H) 06/03/2022   BUN 42 (H) 06/03/2022   CO2 21 05/28/2022   TSH 3.49 01/21/2022   INR 1.2 06/17/2021   HGBA1C 5.2 12/03/2021      Assessment / Plan:     Severe MR and moderate TR Pt has NYHA class 2 symptoms from what appears to be type I annular dilation MR. He has some evidence of posterior jet that really cant see anterior leaflet prolapse. The TR also appears to be Type I. We discussed the indications for surgery and options for minimal invasive incision vs sternotomy and feel that if no need for complete maze ablation procedure, the mini thorocotomy route should be able to perform the repairs. If unable to repair, his age and renal failure would best be to utilize a mechanical valve. He understands that this will require life long anticoagulation. We reviewed all the risks and goals of surgery and he wishes to proceed; I spent 65 min in review of the records and visualizing the studies, face to face with pt and in coordination of future care.    CAD  Secondary to moderate disease on last November cath, I feel it would be prudent with his symptoms of CP to have a repeat cath to evaluate CAD and Right heart pressures.  Renal Failure Will need to arrange surgery the day following his dialysis  Dental care Pt with broken tooth that he is having evaluated next week       @me1 @ 07/20/2022 5:10 PM

## 2022-07-20 NOTE — H&P (View-Only) (Signed)
[address]       ,[place] [address]             Albert Eaton Medical Record #[id number] Date of Birth: 03/12/1966  Albert Mocha, Albert Eaton [name], NP  Chief Complaint:   Dyspnea and CP   History of Present Illness:     Pt is a very pleasant 56 yo AAM that has been on dialysis since 2019 secondary to HTN and he is also sp Left nephrectomy for cancer. He has had atrial arrhythmias in the past that was determined best to be treated medically. He is normally in NSR and not on anticoagulation. He has been suffering more over the past 6 months with DOE that occurs when he pushes himself and going up flights of stairs. He also more recently has had intermittent CP that occurs at rest and with exertion. He had a cath last November with moderate disease throughout. 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 He had a dilated LA and in addition had moderate TR. Pt was felt at his age to best be served with repair and was referred for consideration. Pt feels that the hemodialysis has been better at keeping his volume status better managed than his peritoneal dialysis. Pt reports that he is also considered for renal transplant. He is having his teeth evaluated early next month       Past Medical History:  Diagnosis Date   Anemia    Aortic atherosclerosis (HCC)    Arthritis    Asthma    Cancer ([place])    renal cyst   Chronic diastolic CHF (congestive heart failure) ([place]) 01/06/2017   Echo 7/16 [facility] in [place], [name]) Mild AI, mild LAE, mild concentric LVH, EF 55, normal wall motion, mild to moderate MR, mild PI, RVSP 55 // Echo 10/09/16 ([facility]):  Moderate LVH, grade 2 diastolic dysfunction, mild MR, moderate LAE    CKD (chronic kidney disease)    Dialysis Mon Wed Fri   Esophagitis    GERD (gastroesophageal reflux  disease)    Gout    no current problems   Heart murmur    never caused any problems   Hepatitis    Hep B   History of nuclear stress test    a. Nuc study 7/16: no scar or ischemia, EF 42 // b. Nuc study 12/17: EF 48, ?small apical ischemia, Low Risk   HIV (human immunodeficiency virus infection) ([place])    HLD (hyperlipidemia)    Hypertension    Hypertensive heart disease with CHF (congestive heart failure) ([place]) 10/09/2016   Hypothyroidism    Mitral regurgitation    OSA (obstructive sleep apnea)    Post-operative nausea and vomiting    1 time as a child, no problems as an adult   Sleep apnea    uses c-pap   Thyroid disease    Wears glasses    Wears partial dentures     Past Surgical History:  Procedure Laterality Date   AV FISTULA PLACEMENT Left 07/02/2018   Procedure: Creation of Left arm BRACHIOBASILIC ARTERIOVENOUS  FISTULA;  Surgeon: Marty Heck, Albert Eaton;  Location: [place];  Service: Vascular;  Laterality: Left;   [place] Left 08/27/2018   Procedure: Left arm BRACHIOBASILIC VEIN TRANSPOSITION  SECOND STAGE;  Surgeon: Marty Heck, Albert Eaton;  Location: [place];  Service: Vascular;  Laterality: Left;   BUBBLE STUDY  09/03/2020   Procedure: BUBBLE STUDY;  Surgeon: Fay Records, Albert Eaton;  Location: [place];  Service: Cardiovascular;;   BUBBLE STUDY  06/03/2022   Procedure: BUBBLE STUDY;  Surgeon: Elouise Munroe, Albert Eaton;  Location: [place];  Service: Cardiology;;   CAPD INSERTION N/A 05/30/2021   Procedure: LAPAROSCOPIC INSERTION CONTINUOUS AMBULATORY PERITONEAL DIALYSIS  (CAPD) CATHETER;  Surgeon: [name], Albert Eaton;  Location: [place];  Service: Vascular;  Laterality: N/A;   CAPD REMOVAL N/A 02/13/2022   Procedure: PERITONEAL DIALYSIS CATHETER REMOVAL;  Surgeon: [name], Albert Eaton;  Location[facility];  Service: Vascular;  Laterality: N/A;   CHOLECYSTECTOMY     COLONOSCOPY W/ BIOPSIES AND POLYPECTOMY     GIVENS CAPSULE STUDY N/A 03/01/2018   Procedure:  GIVENS CAPSULE STUDY;  Surgeon: Jerene Bears, Albert Eaton;  Location: [place];  Service: Gastroenterology;  Laterality: N/A;   HERNIA REPAIR     As baby   MULTIPLE TOOTH EXTRACTIONS     NEPHRECTOMY Left 02/18/2021   NOSE SURGERY     RENAL BIOPSY     RIGHT/LEFT HEART CATH AND CORONARY ANGIOGRAPHY N/A 09/04/2021   Procedure: RIGHT/LEFT HEART CATH AND CORONARY ANGIOGRAPHY;  Surgeon: Burnell Blanks, Albert Eaton;  Location: [place] [facility];  Service: Cardiovascular;  Laterality: N/A;   TEE WITHOUT CARDIOVERSION N/A 09/03/2020   Procedure: TRANSESOPHAGEAL ECHOCARDIOGRAM (TEE);  Surgeon: Fay Records, Albert Eaton;  Location: [place];  Service: Cardiovascular;  Laterality: N/A;   TEE WITHOUT CARDIOVERSION N/A 06/03/2022   Procedure: TRANSESOPHAGEAL ECHOCARDIOGRAM (TEE);  Surgeon: Elouise Munroe, Albert Eaton;  Location: [place];  Service: Cardiology;  Laterality: N/A;   UPPER GI ENDOSCOPY  04/05/2021    Social History   Tobacco Use  Smoking Status Former   Packs/day: 1.00   Years: 18.00   Total pack years: 18.00   Types: Cigarettes   Quit date: 2000   Years since quitting: 23.7  Smokeless Tobacco Never    Social History   Substance and Sexual Activity  Alcohol Use Not Currently    Social History   Socioeconomic History   Marital status: Single    Spouse name: Not on file   Number of children: Not on file   Years of education: Not on file   Highest education level: Not on file  Occupational History   Not on file  Tobacco Use   Smoking status: Former    Packs/day: 1.00    Years: 18.00    Total pack years: 18.00    Types: Cigarettes    Quit date: 2000    Years since quitting: 23.7   Smokeless tobacco: Never  Vaping Use   Vaping Use: Never used  Substance and Sexual Activity   Alcohol use: Not Currently   Drug use: No   Sexual activity: Not Currently    Partners: Male  Other Topics Concern   Not on file  Social History Narrative   Disability      [place]: none   Social  Determinants of Health   Financial Resource Strain: Low Risk  (07/08/2022)   Overall Financial Resource Strain (CARDIA)    Difficulty of Paying Living Expenses: Not hard at all  Food Insecurity: Food Insecurity Present (07/08/2022)   Hunger Vital Sign    Worried About Running Out of Food in the Last Year: Sometimes true    Ran Out of  Food in the Last Year: Sometimes true  Transportation Needs: No Transportation Needs (07/08/2022)   PRAPARE - Hydrologist (Medical): No    Lack of Transportation (Non-Medical): No  Physical Activity: Insufficiently Active (07/08/2022)   Exercise Vital Sign    Days of Exercise per Week: 2 days    Minutes of Exercise per Session: 60 min  Stress: No Stress Concern Present (07/08/2022)   [place]    Feeling of Stress : Not at all  Social Connections: Socially Isolated (07/08/2022)   Social Connection and Isolation Panel [NHANES]    Frequency of Communication with Friends and Family: More than three times a week    Frequency of Social Gatherings with Friends and Family: More than three times a week    Attends Religious Services: Never    Marine scientist or Organizations: No    Attends Archivist Meetings: Never    Marital Status: Never married  Intimate Partner Violence: Not At Risk (07/08/2022)   Humiliation, Afraid, Rape, and Kick questionnaire    Fear of Current or Ex-Partner: No    Emotionally Abused: No    Physically Abused: No    Sexually Abused: No    Allergies  Allergen Reactions   Ace Inhibitors Cough and Other (See Comments)    Current Outpatient Medications  Medication Sig Dispense Refill   ACETAMINOPHEN EXTRA STRENGTH 500 MG tablet Take 1,000 mg by mouth every 6 (six) hours as needed (pain.).     allopurinol (ZYLOPRIM) 100 MG tablet TAKE 1 TABLET(100 MG) BY MOUTH TWICE DAILY FOR 60 DOSES 60 tablet 0   amLODipine (NORVASC) 10 MG  tablet Take 1 tablet (10 mg total) by mouth daily. (Patient taking differently: Take 10 mg by mouth daily in the afternoon.) 30 tablet 1   aspirin EC 81 MG tablet Take 1 tablet (81 mg total) by mouth daily. Swallow whole. 90 tablet 3   azelastine (ASTELIN) 0.1 % nasal spray Place 2 sprays into both nostrils 2 (two) times daily as needed for allergies. 30 mL 5   BIKTARVY 50-200-25 MG TABS tablet TAKE ONE TABLET BY MOUTH EVERY DAY 30 tablet 5   budesonide-formoterol (SYMBICORT) 160-4.5 MCG/ACT inhaler Inhale 2 puffs into the lungs daily. 1 each 5   clobetasol (OLUX) 0.05 % topical foam Apply topically 2 (two) times daily. 50 g 2   desloratadine (CLARINEX) 5 MG tablet Take 1 tablet (5 mg total) by mouth daily. 30 tablet 5   famotidine (PEPCID) 20 MG tablet Take 1 tablet (20 mg total) by mouth 2 (two) times daily. (Patient taking differently: Take 20 mg by mouth 2 (two) times daily as needed for indigestion or heartburn.) 180 tablet 3   ferric citrate (AURYXIA) 1 GM 210 MG(Fe) tablet Take 420 mg by mouth with breakfast, with lunch, and with evening meal.     fluticasone (FLOVENT HFA) 110 MCG/ACT inhaler Inhale 2 puffs into the lungs 2 (two) times daily. 12 g 5   gabapentin (NEURONTIN) 300 MG capsule TAKE 1 CAPSULE(300 MG) BY MOUTH THREE TIMES DAILY 90 capsule 3   hydrALAZINE (APRESOLINE) 100 MG tablet Take 100 mg by mouth 3 (three) times daily.     ipratropium (ATROVENT) 0.06 % nasal spray USE 2 SPRAYS IN EACH NOSTRIL 2 TO 3 TIMES DAILY AS NEEDED 15 mL 5   lactulose (CHRONULAC) 10 GM/15ML solution Take 20 g by mouth daily as needed for moderate constipation.  levalbuterol (XOPENEX HFA) 45 MCG/ACT inhaler Inhale 2 puffs into the lungs every 4 (four) hours as needed for wheezing. 15 g 1   levothyroxine (SYNTHROID) 25 MCG tablet Take 25 mcg by mouth daily before breakfast.     lubiprostone (AMITIZA) 24 MCG capsule Take 1 capsule (24 mcg total) by mouth 2 (two) times daily with a meal. NEEDS OFFICE VISIT  FOR FURTHER REFILLS (Patient taking differently: Take 24 mcg by mouth 2 (two) times daily as needed for constipation.) 60 capsule 1   methocarbamol (ROBAXIN) 500 MG tablet Take 1 tablet (500 mg total) by mouth 2 (two) times daily as needed for muscle spasms. 15 tablet 0   Methoxy PEG-Epoetin Beta (MIRCERA IJ) Inject 1 each into the skin every 30 (thirty) days. Administered at dialysis center once a month     metoprolol tartrate (LOPRESSOR) 25 MG tablet Take 12.5 mg by mouth 2 (two) times daily.     multivitamin (RENA-VIT) TABS tablet Take 1 tablet by mouth at bedtime.     OLOPATADINE HCL OP Place 1 drop into both eyes daily as needed (allergies).     pantoprazole (PROTONIX) 40 MG tablet Take 1 tablet (40 mg total) by mouth daily. **PLEASE CONTACT THE OFFICE TO SCHEDULE FOLLOW UP (Patient taking differently: Take 40 mg by mouth at bedtime.) 30 tablet 0   predniSONE (DELTASONE) 20 MG tablet Take 20 mg by mouth daily.     rOPINIRole (REQUIP) 1 MG tablet TAKE ONE TABLET BY MOUTH AT BEDTIME 90 tablet 2   rosuvastatin (CRESTOR) 10 MG tablet TAKE ONE TABLET BY MOUTH DAILY 90 tablet 3   SENSIPAR 60 MG tablet Take 1 tablet by mouth daily.     Spacer/Aero-Holding Chambers DEVI 1 Device by Does not apply route 2 (two) times a day. 1 each 1   Vitamin D, Ergocalciferol, (DRISDOL) 1.25 MG (50000 UNIT) CAPS capsule Take 50,000 Units by mouth every Tuesday.     No current facility-administered medications for this visit.     Family History  Problem Relation Age of Onset   Hypertension Mother    Heart failure Mother    Hypertension Father    Alcohol abuse Father    Hypertension Sister    Multiple sclerosis Sister    Hypertension Sister    Hypertension Brother    Heart attack Maternal Grandmother 53   Scoliosis Other    Allergic rhinitis Neg Hx    Angioedema Neg Hx    Asthma Neg Hx    Eczema Neg Hx    Immunodeficiency Neg Hx    Urticaria Neg Hx    Colon cancer Neg Hx    Pancreatic cancer Neg Hx     Esophageal cancer Neg Hx        Physical Exam: There were no vitals taken for this visit. HEENT: teeth in good repair Lungs: clear Cardiac: RR with only a very soft murmur Ext: no edema    Diagnostic Studies & Laboratory data: I have personally reviewed the following studies and agree with the findings:    TEE (05/2022)  FINDINGS   Left Ventricle: Left ventricular ejection fraction, by estimation, is  55%. The left ventricle has normal function. The left ventricular internal  cavity size was mildly dilated.   Right Ventricle: The right ventricular size is mildly enlarged. No  increase in right ventricular wall thickness. Right ventricular systolic  function is normal.   Left Atrium: Left atrial size was severely dilated. No left atrial/left  atrial appendage thrombus  was detected. The LAA emptying velocity was 62  cm/s.   Right Atrium: Right atrial size was moderately dilated.   Pericardium: There is no evidence of pericardial effusion.   Mitral Valve: Severe mitral valve regurgitation. Mechanism appears likely  atrial functional and MR is commissural. No definite pulmonary vein  reversals in systole, may be due to low blood pressure during sedation. At  ambulatory blood pressures, suspect  MR is severe. The mitral valve is grossly normal. Severe mitral valve  regurgitation. No evidence of mitral valve stenosis. MV peak gradient, 6.0  mmHg. The mean mitral valve gradient is 2.0 mmHg.   Tricuspid Valve: The tricuspid valve is grossly normal. Tricuspid valve  regurgitation is moderate.   Aortic Valve: The aortic valve is tricuspid. Aortic valve regurgitation is  trivial. No aortic stenosis is present.   Pulmonic Valve: The pulmonic valve was not well visualized. Pulmonic valve  regurgitation is mild.   Aorta: Aortic dilatation noted. There is mild dilatation of the ascending  aorta, measuring 40 mm. There is mild (Grade II) atheroma plaque involving  the aortic  arch and descending aorta.   IAS/Shunts: No atrial level shunt detected by color flow Doppler. Agitated  saline contrast was given intravenously to evaluate for intracardiac  shunting. Agitated saline contrast bubble study was negative, with no  evidence of any interatrial shunt.      LEFT VENTRICLE  PLAX 2D  LVOT diam:     2.00 cm  LV SV:         74  LV SV Index:   36  LVOT Area:     3.14 cm      AORTIC VALVE  LVOT Vmax:   130.00 cm/s  LVOT Vmean:  87.200 cm/s  LVOT VTI:    0.235 m     AORTA  Ao Root diam: 3.60 cm  Ao Asc diam:  4.00 cm   MITRAL VALVE  MV Area VTI:  2.93 cm     SHUNTS  MV Peak grad: 6.0 mmHg     Systemic VTI:  0.24 m  MV Mean grad: 2.0 mmHg     Systemic Diam: 2.00 cm  MV Vmax:      1.22 m/s  MV Vmean:     61.3 cm/s  MR Peak grad: 65.8 mmHg  MR Mean grad: 45.5 mmHg  MR Vmax:      405.50 cm/s  MR Vmean:     300.0 cm/s  MR PISA:      3.08 cm    Cath (08/2021) Conclusion   Prox RCA lesion is 50% stenosed.   Mid LAD lesion is 50% stenosed.   Prox LAD lesion is 40% stenosed.   2nd Mrg lesion is 30% stenosed.   Mild to moderate non-obstructive CAD Normal right and left heart pressures   Continue medical management of CAD.  Hemo Data  Flowsheet Row Most Recent Value  Fick Cardiac Output 10.73 L/min  Fick Cardiac Output Index 5.06 (L/min)/BSA  RA A Wave 7 mmHg  RA V Wave 5 mmHg  RA Mean 3 mmHg  RV Systolic Pressure 30 mmHg  RV Diastolic Pressure 1 mmHg  RV EDP 6 mmHg  PA Systolic Pressure 20 mmHg  PA Diastolic Pressure 5 mmHg  PA Mean 11 mmHg  PW A Wave 14 mmHg  PW V Wave 11 mmHg  PW Mean 10 mmHg  AO Systolic Pressure 416 mmHg  AO Diastolic Pressure 71 mmHg  AO Mean 88 mmHg  LV Systolic Pressure  96 mmHg  LV Diastolic Pressure 10 mmHg  LV EDP 13 mmHg  AOp Systolic Pressure 798 mmHg  AOp Diastolic Pressure 72 mmHg  AOp Mean Pressure 88 mmHg  LVp Systolic Pressure 921 mmHg  LVp Diastolic Pressure 11 mmHg  LVp EDP Pressure 15 mmHg   QP/QS 1  TPVR Index 3.55 HRUI  TSVR Index 17.38 HRUI  PVR SVR Ratio 0.09  TPVR/TSVR Ratio 0.2    Recent Lab Findings: Lab Results  Component Value Date   WBC 6.6 05/28/2022   HGB 11.9 (L) 06/03/2022   HCT 35.0 (L) 06/03/2022   PLT 212 05/28/2022   GLUCOSE 95 06/03/2022   CHOL 113 04/17/2022   TRIG 131 04/17/2022   HDL 35 (L) 04/17/2022   LDLCALC 57 04/17/2022   ALT 9 04/17/2022   AST 12 04/17/2022   NA 141 06/03/2022   K 3.9 06/03/2022   CL 105 06/03/2022   CREATININE 12.20 (H) 06/03/2022   BUN 42 (H) 06/03/2022   CO2 21 05/28/2022   TSH 3.49 01/21/2022   INR 1.2 06/17/2021   HGBA1C 5.2 12/03/2021      Assessment / Plan:     Severe MR and moderate TR Pt has NYHA class 2 symptoms from what appears to be type I annular dilation MR. He has some evidence of posterior jet that really cant see anterior leaflet prolapse. The TR also appears to be Type I. We discussed the indications for surgery and options for minimal invasive incision vs sternotomy and feel that if no need for complete maze ablation procedure, the mini thorocotomy route should be able to perform the repairs. If unable to repair, his age and renal failure would best be to utilize a mechanical valve. He understands that this will require life long anticoagulation. We reviewed all the risks and goals of surgery and he wishes to proceed; I spent 65 min in review of the records and visualizing the studies, face to face with pt and in coordination of future care.    CAD  Secondary to moderate disease on last November cath, I feel it would be prudent with his symptoms of CP to have a repeat cath to evaluate CAD and Right heart pressures.  Renal Failure Will need to arrange surgery the day following his dialysis  Dental care Pt with broken tooth that he is having evaluated next week       @me1 @ 07/20/2022 5:10 PM

## 2022-07-21 ENCOUNTER — Institutional Professional Consult (permissible substitution) (INDEPENDENT_AMBULATORY_CARE_PROVIDER_SITE_OTHER): Payer: Medicare Other | Admitting: Thoracic Surgery (Cardiothoracic Vascular Surgery)

## 2022-07-21 ENCOUNTER — Encounter: Payer: Self-pay | Admitting: Thoracic Surgery (Cardiothoracic Vascular Surgery)

## 2022-07-21 VITALS — BP 130/79 | HR 92 | Resp 20 | Ht 70.0 in | Wt 198.4 lb

## 2022-07-21 DIAGNOSIS — N186 End stage renal disease: Secondary | ICD-10-CM | POA: Diagnosis not present

## 2022-07-21 DIAGNOSIS — E876 Hypokalemia: Secondary | ICD-10-CM | POA: Diagnosis not present

## 2022-07-21 DIAGNOSIS — D509 Iron deficiency anemia, unspecified: Secondary | ICD-10-CM | POA: Diagnosis not present

## 2022-07-21 DIAGNOSIS — N2581 Secondary hyperparathyroidism of renal origin: Secondary | ICD-10-CM | POA: Diagnosis not present

## 2022-07-21 DIAGNOSIS — D631 Anemia in chronic kidney disease: Secondary | ICD-10-CM | POA: Diagnosis not present

## 2022-07-21 DIAGNOSIS — I340 Nonrheumatic mitral (valve) insufficiency: Secondary | ICD-10-CM | POA: Diagnosis not present

## 2022-07-21 DIAGNOSIS — Z992 Dependence on renal dialysis: Secondary | ICD-10-CM | POA: Diagnosis not present

## 2022-07-21 DIAGNOSIS — M109 Gout, unspecified: Secondary | ICD-10-CM | POA: Diagnosis not present

## 2022-07-21 NOTE — Patient Instructions (Signed)
Cardiac cath Dental evaluation Schedule surgery day after outpt dialysis

## 2022-07-22 ENCOUNTER — Telehealth: Payer: Self-pay

## 2022-07-22 DIAGNOSIS — Z01810 Encounter for preprocedural cardiovascular examination: Secondary | ICD-10-CM

## 2022-07-22 NOTE — Telephone Encounter (Signed)
-----   Message from [name], MD sent at 07/21/2022  2:51 PM EDT ----- Judson Roch - can you set him up for a R/L heart cath for severe MR/TR, with me to do cath? This is a preop study for Dr Lavonna Monarch. We can sign his note for the pre-cath H&P don't need to bring him in to our office for a visit.   Thx - Coop

## 2022-07-22 NOTE — Telephone Encounter (Signed)
Called and spoke with patient. He is scheduled for cath on Friday 08/01/22 w/[facility] at 2pm. He will not arrive early for hydration as he is a dialysis pt. Will get needed labs (within 7 days of procedure) this Friday 07/25/22. Instructions sent to pt via Mychart.

## 2022-07-23 ENCOUNTER — Ambulatory Visit (INDEPENDENT_AMBULATORY_CARE_PROVIDER_SITE_OTHER): Payer: Medicare Other | Admitting: Orthopaedic Surgery

## 2022-07-23 ENCOUNTER — Encounter: Payer: Self-pay | Admitting: Orthopaedic Surgery

## 2022-07-23 DIAGNOSIS — E876 Hypokalemia: Secondary | ICD-10-CM | POA: Diagnosis not present

## 2022-07-23 DIAGNOSIS — M25571 Pain in right ankle and joints of right foot: Secondary | ICD-10-CM

## 2022-07-23 DIAGNOSIS — D631 Anemia in chronic kidney disease: Secondary | ICD-10-CM | POA: Diagnosis not present

## 2022-07-23 DIAGNOSIS — N2581 Secondary hyperparathyroidism of renal origin: Secondary | ICD-10-CM | POA: Diagnosis not present

## 2022-07-23 DIAGNOSIS — N186 End stage renal disease: Secondary | ICD-10-CM | POA: Diagnosis not present

## 2022-07-23 DIAGNOSIS — M109 Gout, unspecified: Secondary | ICD-10-CM | POA: Diagnosis not present

## 2022-07-23 DIAGNOSIS — Z992 Dependence on renal dialysis: Secondary | ICD-10-CM | POA: Diagnosis not present

## 2022-07-23 DIAGNOSIS — D509 Iron deficiency anemia, unspecified: Secondary | ICD-10-CM | POA: Diagnosis not present

## 2022-07-23 NOTE — Progress Notes (Signed)
The patient is a very pleasant 56 year old gentleman who has chronic right ankle pain.  He was actually seen by one of our providers a year ago here in the office for the same issue.  He actually fractured his ankle he said in 2010 that did not require surgery.  With time he has developed worsening pain in that right ankle with weightbearing activities.  Examination his right ankle does show actually some pain with flexion extension of the ankle joint and the tibiotalar joint itself.  Most of the pain seems to be medial.  I do feel that there is some slight incompetence of his posterior tibial tendon on exam.  He is reporting some knee pain and hip pain bilaterally.  On exam I can easily move both hips around in both knees and some of this may be related to how he is favoring his right ankle and limping with time has has been dealing with ankle pain.  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  This point a MRI of the right ankle is warranted to assess for a talar dome OCD lesion as well as to assess the posterior tibial tendon.  He agrees with this treatment plan.  He has failed conservative treatment for several years now with his right ankle and this MRI is warranted based on the plain film findings and clinical exam findings.

## 2022-07-23 NOTE — Addendum Note (Signed)
Addended by: Robyne Peers on: 07/23/2022 05:01 PM   Modules accepted: Orders

## 2022-07-25 ENCOUNTER — Ambulatory Visit: Payer: Medicare Other | Attending: Cardiovascular Disease

## 2022-07-25 DIAGNOSIS — Z01810 Encounter for preprocedural cardiovascular examination: Secondary | ICD-10-CM

## 2022-07-25 DIAGNOSIS — E876 Hypokalemia: Secondary | ICD-10-CM | POA: Diagnosis not present

## 2022-07-25 DIAGNOSIS — D631 Anemia in chronic kidney disease: Secondary | ICD-10-CM | POA: Diagnosis not present

## 2022-07-25 DIAGNOSIS — Z992 Dependence on renal dialysis: Secondary | ICD-10-CM | POA: Diagnosis not present

## 2022-07-25 DIAGNOSIS — M109 Gout, unspecified: Secondary | ICD-10-CM | POA: Diagnosis not present

## 2022-07-25 DIAGNOSIS — D509 Iron deficiency anemia, unspecified: Secondary | ICD-10-CM | POA: Diagnosis not present

## 2022-07-25 DIAGNOSIS — N2581 Secondary hyperparathyroidism of renal origin: Secondary | ICD-10-CM | POA: Diagnosis not present

## 2022-07-25 DIAGNOSIS — N186 End stage renal disease: Secondary | ICD-10-CM | POA: Diagnosis not present

## 2022-07-26 DIAGNOSIS — Z992 Dependence on renal dialysis: Secondary | ICD-10-CM | POA: Diagnosis not present

## 2022-07-26 DIAGNOSIS — N186 End stage renal disease: Secondary | ICD-10-CM | POA: Diagnosis not present

## 2022-07-26 DIAGNOSIS — I129 Hypertensive chronic kidney disease with stage 1 through stage 4 chronic kidney disease, or unspecified chronic kidney disease: Secondary | ICD-10-CM | POA: Diagnosis not present

## 2022-07-26 LAB — CBC
Hematocrit: 39.2 % (ref 37.5–51.0)
Hemoglobin: 13.1 g/dL (ref 13.0–17.7)
MCH: 30.5 pg (ref 26.6–33.0)
MCHC: 33.4 g/dL (ref 31.5–35.7)
MCV: 91 fL (ref 79–97)
Platelets: 224 10*3/uL (ref 150–450)
RBC: 4.29 x10E6/uL (ref 4.14–5.80)
RDW: 17.1 % — ABNORMAL HIGH (ref 11.6–15.4)
WBC: 5.6 10*3/uL (ref 3.4–10.8)

## 2022-07-26 LAB — BASIC METABOLIC PANEL
BUN/Creatinine Ratio: 3 — ABNORMAL LOW (ref 9–20)
BUN: 18 mg/dL (ref 6–24)
CO2: 23 mmol/L (ref 20–29)
Calcium: 9.8 mg/dL (ref 8.7–10.2)
Chloride: 94 mmol/L — ABNORMAL LOW (ref 96–106)
Creatinine, Ser: 5.5 mg/dL — ABNORMAL HIGH (ref 0.76–1.27)
Glucose: 184 mg/dL — ABNORMAL HIGH (ref 70–99)
Potassium: 3.2 mmol/L — ABNORMAL LOW (ref 3.5–5.2)
Sodium: 141 mmol/L (ref 134–144)
eGFR: 11 mL/min/{1.73_m2} — ABNORMAL LOW (ref >59)

## 2022-07-28 DIAGNOSIS — D631 Anemia in chronic kidney disease: Secondary | ICD-10-CM | POA: Diagnosis not present

## 2022-07-28 DIAGNOSIS — N2581 Secondary hyperparathyroidism of renal origin: Secondary | ICD-10-CM | POA: Diagnosis not present

## 2022-07-28 DIAGNOSIS — Z992 Dependence on renal dialysis: Secondary | ICD-10-CM | POA: Diagnosis not present

## 2022-07-28 DIAGNOSIS — N186 End stage renal disease: Secondary | ICD-10-CM | POA: Diagnosis not present

## 2022-07-28 DIAGNOSIS — D509 Iron deficiency anemia, unspecified: Secondary | ICD-10-CM | POA: Diagnosis not present

## 2022-07-28 DIAGNOSIS — E079 Disorder of thyroid, unspecified: Secondary | ICD-10-CM | POA: Diagnosis not present

## 2022-07-28 DIAGNOSIS — E876 Hypokalemia: Secondary | ICD-10-CM | POA: Diagnosis not present

## 2022-07-30 DIAGNOSIS — N186 End stage renal disease: Secondary | ICD-10-CM | POA: Diagnosis not present

## 2022-07-30 DIAGNOSIS — E876 Hypokalemia: Secondary | ICD-10-CM | POA: Diagnosis not present

## 2022-07-30 DIAGNOSIS — Z992 Dependence on renal dialysis: Secondary | ICD-10-CM | POA: Diagnosis not present

## 2022-07-30 DIAGNOSIS — E079 Disorder of thyroid, unspecified: Secondary | ICD-10-CM | POA: Diagnosis not present

## 2022-07-30 DIAGNOSIS — D631 Anemia in chronic kidney disease: Secondary | ICD-10-CM | POA: Diagnosis not present

## 2022-07-30 DIAGNOSIS — N2581 Secondary hyperparathyroidism of renal origin: Secondary | ICD-10-CM | POA: Diagnosis not present

## 2022-07-30 DIAGNOSIS — D509 Iron deficiency anemia, unspecified: Secondary | ICD-10-CM | POA: Diagnosis not present

## 2022-07-31 ENCOUNTER — Telehealth: Payer: Self-pay | Admitting: *Deleted

## 2022-07-31 NOTE — Telephone Encounter (Addendum)
Cardiac Catheterization scheduled at [id number] August 01, 2022 2 PM Arrival time and place: [place] Entrance A at: pt will arrive after dialysis is completed around 16 Noon  Nothing to eat after midnight prior to procedure, clear liquids until 5 AM day of procedure.  Medication instructions: -Usual morning medications can be taken with sips of water including aspirin 81 mg.  Confirmed patient has responsible adult to drive home post procedure and be with patient first 24 hours after arriving home.  Patient reports no new symptoms concerning for COVID-19 in the past 10 days.  Reviewed procedure instructions with patient.

## 2022-08-01 ENCOUNTER — Ambulatory Visit (HOSPITAL_COMMUNITY)
Admission: RE | Admit: 2022-08-01 | Discharge: 2022-08-01 | Disposition: A | Discharge status: Home / Self Care | Payer: Medicare Other | Attending: Cardiovascular Disease | Admitting: Cardiovascular Disease

## 2022-08-01 ENCOUNTER — Ambulatory Visit (HOSPITAL_COMMUNITY)
Admission: RE | Disposition: A | Discharge status: Home / Self Care | Payer: Self-pay | Source: Home / Self Care | Attending: Cardiovascular Disease

## 2022-08-01 ENCOUNTER — Other Ambulatory Visit: Payer: Self-pay

## 2022-08-01 DIAGNOSIS — N186 End stage renal disease: Secondary | ICD-10-CM | POA: Insufficient documentation

## 2022-08-01 DIAGNOSIS — Z905 Acquired absence of kidney: Secondary | ICD-10-CM | POA: Insufficient documentation

## 2022-08-01 DIAGNOSIS — N2581 Secondary hyperparathyroidism of renal origin: Secondary | ICD-10-CM | POA: Diagnosis not present

## 2022-08-01 DIAGNOSIS — I2510 Atherosclerotic heart disease of native coronary artery without angina pectoris: Secondary | ICD-10-CM | POA: Insufficient documentation

## 2022-08-01 DIAGNOSIS — I340 Nonrheumatic mitral (valve) insufficiency: Secondary | ICD-10-CM | POA: Diagnosis not present

## 2022-08-01 DIAGNOSIS — Z87891 Personal history of nicotine dependence: Secondary | ICD-10-CM | POA: Diagnosis not present

## 2022-08-01 DIAGNOSIS — D509 Iron deficiency anemia, unspecified: Secondary | ICD-10-CM | POA: Diagnosis not present

## 2022-08-01 DIAGNOSIS — I5032 Chronic diastolic (congestive) heart failure: Secondary | ICD-10-CM | POA: Insufficient documentation

## 2022-08-01 DIAGNOSIS — I132 Hypertensive heart and chronic kidney disease with heart failure and with stage 5 chronic kidney disease, or end stage renal disease: Secondary | ICD-10-CM | POA: Diagnosis not present

## 2022-08-01 DIAGNOSIS — E876 Hypokalemia: Secondary | ICD-10-CM | POA: Diagnosis not present

## 2022-08-01 DIAGNOSIS — Z992 Dependence on renal dialysis: Secondary | ICD-10-CM | POA: Diagnosis not present

## 2022-08-01 DIAGNOSIS — E079 Disorder of thyroid, unspecified: Secondary | ICD-10-CM | POA: Diagnosis not present

## 2022-08-01 DIAGNOSIS — D631 Anemia in chronic kidney disease: Secondary | ICD-10-CM | POA: Diagnosis not present

## 2022-08-01 HISTORY — PX: RIGHT/LEFT HEART CATH AND CORONARY ANGIOGRAPHY: CATH118266

## 2022-08-01 LAB — POCT I-STAT EG7
Acid-Base Excess: 1 mmol/L (ref 0.0–2.0)
Acid-Base Excess: 3 mmol/L — ABNORMAL HIGH (ref 0.0–2.0)
Bicarbonate: 25.8 mmol/L (ref 20.0–28.0)
Bicarbonate: 27.8 mmol/L (ref 20.0–28.0)
Calcium, Ion: 0.96 mmol/L — ABNORMAL LOW (ref 1.15–1.40)
Calcium, Ion: 1.07 mmol/L — ABNORMAL LOW (ref 1.15–1.40)
HCT: 37 % — ABNORMAL LOW (ref 39.0–52.0)
HCT: 39 % (ref 39.0–52.0)
Hemoglobin: 12.6 g/dL — ABNORMAL LOW (ref 13.0–17.0)
Hemoglobin: 13.3 g/dL (ref 13.0–17.0)
O2 Saturation: 79 %
O2 Saturation: 81 %
Potassium: 3.1 mmol/L — ABNORMAL LOW (ref 3.5–5.1)
Potassium: 3.4 mmol/L — ABNORMAL LOW (ref 3.5–5.1)
Sodium: 139 mmol/L (ref 135–145)
Sodium: 142 mmol/L (ref 135–145)
TCO2: 27 mmol/L (ref 22–32)
TCO2: 29 mmol/L (ref 22–32)
pCO2, Ven: 39.6 mmHg — ABNORMAL LOW (ref 44–60)
pCO2, Ven: 43 mmHg — ABNORMAL LOW (ref 44–60)
pH, Ven: 7.418 (ref 7.25–7.43)
pH, Ven: 7.421 (ref 7.25–7.43)
pO2, Ven: 43 mmHg (ref 32–45)
pO2, Ven: 45 mmHg (ref 32–45)

## 2022-08-01 LAB — BASIC METABOLIC PANEL
Anion gap: 17 — ABNORMAL HIGH (ref 5–15)
BUN: 16 mg/dL (ref 6–20)
CO2: 26 mmol/L (ref 22–32)
Calcium: 9.6 mg/dL (ref 8.9–10.3)
Chloride: 96 mmol/L — ABNORMAL LOW (ref 98–111)
Creatinine, Ser: 5.98 mg/dL — ABNORMAL HIGH (ref 0.61–1.24)
GFR, Estimated: 10 mL/min — ABNORMAL LOW (ref >60)
Glucose, Bld: 115 mg/dL — ABNORMAL HIGH (ref 70–99)
Potassium: 3.3 mmol/L — ABNORMAL LOW (ref 3.5–5.1)
Sodium: 139 mmol/L (ref 135–145)

## 2022-08-01 SURGERY — RIGHT/LEFT HEART CATH AND CORONARY ANGIOGRAPHY
Anesthesia: LOCAL

## 2022-08-01 MED ORDER — ACETAMINOPHEN 325 MG PO TABS: 650.0000 mg | ORAL_TABLET | ORAL | Status: DC | PRN

## 2022-08-01 MED ORDER — SODIUM CHLORIDE 0.9% FLUSH: 3.0000 mL | INTRAVENOUS | Status: DC | PRN

## 2022-08-01 MED ORDER — SODIUM CHLORIDE 0.9 % IV SOLN: 250.0000 mL | INTRAVENOUS | Status: DC | PRN

## 2022-08-01 MED ORDER — SODIUM CHLORIDE 0.9% FLUSH: 3.0000 mL | Freq: Two times a day (BID) | INTRAVENOUS | Status: DC

## 2022-08-01 MED ORDER — LIDOCAINE HCL (PF) 1 % IJ SOLN
INTRAMUSCULAR | Status: AC
  Filled 2022-08-01: qty 30

## 2022-08-01 MED ORDER — LABETALOL HCL 5 MG/ML IV SOLN: 10.0000 mg | INTRAVENOUS | Status: DC | PRN

## 2022-08-01 MED ORDER — MIDAZOLAM HCL 2 MG/2ML IJ SOLN
INTRAMUSCULAR | Status: AC
  Filled 2022-08-01: qty 2

## 2022-08-01 MED ORDER — HEPARIN (PORCINE) IN NACL 1000-0.9 UT/500ML-% IV SOLN
INTRAVENOUS | Status: AC
  Filled 2022-08-01: qty 1000

## 2022-08-01 MED ORDER — IOHEXOL 350 MG/ML SOLN
INTRAVENOUS | Status: DC | PRN
  Administered 2022-08-01: 40 mL

## 2022-08-01 MED ORDER — LIDOCAINE HCL (PF) 1 % IJ SOLN
INTRAMUSCULAR | Status: DC | PRN
  Administered 2022-08-01: 5 mL

## 2022-08-01 MED ORDER — FENTANYL CITRATE (PF) 100 MCG/2ML IJ SOLN
INTRAMUSCULAR | Status: AC
  Filled 2022-08-01: qty 2

## 2022-08-01 MED ORDER — SODIUM CHLORIDE 0.9 % IV SOLN: INTRAVENOUS | Status: DC

## 2022-08-01 MED ORDER — FENTANYL CITRATE (PF) 100 MCG/2ML IJ SOLN
INTRAMUSCULAR | Status: DC | PRN
  Administered 2022-08-01 (×2): 25 ug via INTRAVENOUS

## 2022-08-01 MED ORDER — HYDRALAZINE HCL 20 MG/ML IJ SOLN: 10.0000 mg | INTRAMUSCULAR | Status: DC | PRN

## 2022-08-01 MED ORDER — MIDAZOLAM HCL 2 MG/2ML IJ SOLN
INTRAMUSCULAR | Status: DC | PRN
  Administered 2022-08-01 (×2): 1 mg via INTRAVENOUS

## 2022-08-01 MED ORDER — ASPIRIN 81 MG PO CHEW: 81.0000 mg | CHEWABLE_TABLET | ORAL | Status: DC

## 2022-08-01 SURGICAL SUPPLY — 19 items
CATH INFINITI 5FR AL1 (CATHETERS) IMPLANT
CATH INFINITI 5FR JL5 (CATHETERS) IMPLANT
CATH INFINITI 5FR MULTPACK ANG (CATHETERS) IMPLANT
CATH SWAN GANZ 7F STRAIGHT (CATHETERS) IMPLANT
CLOSURE MYNX CONTROL 5F (Vascular Products) IMPLANT
CLOSURE MYNX CONTROL 6F/7F (Vascular Products) IMPLANT
GLIDESHEATH SLEND SS 6F .021 (SHEATH) IMPLANT
GUIDEWIRE INQWIRE 1.5J.035X260 (WIRE) IMPLANT
INQWIRE 1.5J .035X260CM (WIRE) ×1
KIT HEART LEFT (KITS) ×1 IMPLANT
KIT MICROPUNCTURE NIT STIFF (SHEATH) IMPLANT
PACK CARDIAC CATHETERIZATION (CUSTOM PROCEDURE TRAY) ×1 IMPLANT
SHEATH GLIDE SLENDER 4/5FR (SHEATH) IMPLANT
SHEATH PINNACLE 5F 10CM (SHEATH) IMPLANT
SHEATH PINNACLE 7F 10CM (SHEATH) IMPLANT
SHEATH PROBE COVER 6X72 (BAG) IMPLANT
TRANSDUCER W/STOPCOCK (MISCELLANEOUS) ×1 IMPLANT
TUBING CIL FLEX 10 FLL-RA (TUBING) ×1 IMPLANT
WIRE EMERALD 3MM-J .035X150CM (WIRE) IMPLANT

## 2022-08-01 NOTE — Progress Notes (Signed)
Up and walked and tolerated well; right groin stable, no bleeding or hematoma

## 2022-08-01 NOTE — Interval H&P Note (Signed)
History and Physical Interval Note:  08/01/2022 3:38 PM  Albert Eaton  has presented today for surgery, with the diagnosis of MR -TR.  The various methods of treatment have been discussed with the patient and family. After consideration of risks, benefits and other options for treatment, the patient has consented to  Procedure(s): RIGHT/LEFT HEART CATH AND CORONARY ANGIOGRAPHY (N/A) as a surgical intervention.  The patient's history has been reviewed, patient examined, no change in status, stable for surgery.  I have reviewed the patient's chart and labs.  Questions were answered to the patient's satisfaction.     [name]

## 2022-08-03 MED FILL — Heparin Sod (Porcine)-NaCl IV Soln 1000 Unit/500ML-0.9%: INTRAVENOUS | Qty: 1000 | Status: AC

## 2022-08-04 ENCOUNTER — Other Ambulatory Visit: Payer: Self-pay | Admitting: *Deleted

## 2022-08-04 ENCOUNTER — Ambulatory Visit
Admission: RE | Admit: 2022-08-04 | Discharge: 2022-08-04 | Disposition: A | Discharge status: Home / Self Care | Payer: Medicare Other | Source: Ambulatory Visit | Attending: Orthopaedic Surgery | Admitting: Orthopaedic Surgery

## 2022-08-04 ENCOUNTER — Encounter (HOSPITAL_COMMUNITY): Payer: Self-pay | Admitting: Cardiovascular Disease

## 2022-08-04 ENCOUNTER — Telehealth: Payer: Self-pay | Admitting: Orthopaedic Surgery

## 2022-08-04 DIAGNOSIS — I071 Rheumatic tricuspid insufficiency: Secondary | ICD-10-CM

## 2022-08-04 DIAGNOSIS — I340 Nonrheumatic mitral (valve) insufficiency: Secondary | ICD-10-CM

## 2022-08-04 DIAGNOSIS — M25471 Effusion, right ankle: Secondary | ICD-10-CM | POA: Diagnosis not present

## 2022-08-04 DIAGNOSIS — N186 End stage renal disease: Secondary | ICD-10-CM | POA: Diagnosis not present

## 2022-08-04 DIAGNOSIS — D631 Anemia in chronic kidney disease: Secondary | ICD-10-CM | POA: Diagnosis not present

## 2022-08-04 DIAGNOSIS — R600 Localized edema: Secondary | ICD-10-CM | POA: Diagnosis not present

## 2022-08-04 DIAGNOSIS — E876 Hypokalemia: Secondary | ICD-10-CM | POA: Diagnosis not present

## 2022-08-04 DIAGNOSIS — N2581 Secondary hyperparathyroidism of renal origin: Secondary | ICD-10-CM | POA: Diagnosis not present

## 2022-08-04 DIAGNOSIS — Z992 Dependence on renal dialysis: Secondary | ICD-10-CM | POA: Diagnosis not present

## 2022-08-04 DIAGNOSIS — D509 Iron deficiency anemia, unspecified: Secondary | ICD-10-CM | POA: Diagnosis not present

## 2022-08-04 DIAGNOSIS — M25571 Pain in right ankle and joints of right foot: Secondary | ICD-10-CM

## 2022-08-04 DIAGNOSIS — E079 Disorder of thyroid, unspecified: Secondary | ICD-10-CM | POA: Diagnosis not present

## 2022-08-04 DIAGNOSIS — M65871 Other synovitis and tenosynovitis, right ankle and foot: Secondary | ICD-10-CM | POA: Diagnosis not present

## 2022-08-04 LAB — POCT I-STAT 7, (LYTES, BLD GAS, ICA,H+H)
Acid-Base Excess: 3 mmol/L — ABNORMAL HIGH (ref 0.0–2.0)
Bicarbonate: 28.4 mmol/L — ABNORMAL HIGH (ref 20.0–28.0)
Calcium, Ion: 1.08 mmol/L — ABNORMAL LOW (ref 1.15–1.40)
HCT: 39 % (ref 39.0–52.0)
Hemoglobin: 13.3 g/dL (ref 13.0–17.0)
O2 Saturation: 93 %
Potassium: 3.4 mmol/L — ABNORMAL LOW (ref 3.5–5.1)
Sodium: 139 mmol/L (ref 135–145)
TCO2: 30 mmol/L (ref 22–32)
pCO2 arterial: 43.8 mmHg (ref 32–48)
pH, Arterial: 7.42 (ref 7.35–7.45)
pO2, Arterial: 66 mmHg — ABNORMAL LOW (ref 83–108)

## 2022-08-04 NOTE — Telephone Encounter (Signed)
Patient is having a MRI today. Needs an appointment with Dr. Ninfa Linden to go over the MRI. His call back number is [phone]

## 2022-08-05 ENCOUNTER — Encounter: Payer: Self-pay | Admitting: *Deleted

## 2022-08-05 DIAGNOSIS — N186 End stage renal disease: Secondary | ICD-10-CM | POA: Diagnosis not present

## 2022-08-05 DIAGNOSIS — E079 Disorder of thyroid, unspecified: Secondary | ICD-10-CM | POA: Diagnosis not present

## 2022-08-05 DIAGNOSIS — N2581 Secondary hyperparathyroidism of renal origin: Secondary | ICD-10-CM | POA: Diagnosis not present

## 2022-08-05 DIAGNOSIS — D631 Anemia in chronic kidney disease: Secondary | ICD-10-CM | POA: Diagnosis not present

## 2022-08-05 DIAGNOSIS — E876 Hypokalemia: Secondary | ICD-10-CM | POA: Diagnosis not present

## 2022-08-05 DIAGNOSIS — Z992 Dependence on renal dialysis: Secondary | ICD-10-CM | POA: Diagnosis not present

## 2022-08-05 DIAGNOSIS — D509 Iron deficiency anemia, unspecified: Secondary | ICD-10-CM | POA: Diagnosis not present

## 2022-08-06 ENCOUNTER — Other Ambulatory Visit: Payer: Self-pay | Admitting: Allergy & Immunology

## 2022-08-06 DIAGNOSIS — D631 Anemia in chronic kidney disease: Secondary | ICD-10-CM | POA: Diagnosis not present

## 2022-08-06 DIAGNOSIS — E079 Disorder of thyroid, unspecified: Secondary | ICD-10-CM | POA: Diagnosis not present

## 2022-08-06 DIAGNOSIS — N186 End stage renal disease: Secondary | ICD-10-CM | POA: Diagnosis not present

## 2022-08-06 DIAGNOSIS — D509 Iron deficiency anemia, unspecified: Secondary | ICD-10-CM | POA: Diagnosis not present

## 2022-08-06 DIAGNOSIS — Z992 Dependence on renal dialysis: Secondary | ICD-10-CM | POA: Diagnosis not present

## 2022-08-06 DIAGNOSIS — E876 Hypokalemia: Secondary | ICD-10-CM | POA: Diagnosis not present

## 2022-08-06 DIAGNOSIS — N2581 Secondary hyperparathyroidism of renal origin: Secondary | ICD-10-CM | POA: Diagnosis not present

## 2022-08-07 ENCOUNTER — Other Ambulatory Visit: Payer: Self-pay

## 2022-08-07 ENCOUNTER — Ambulatory Visit (INDEPENDENT_AMBULATORY_CARE_PROVIDER_SITE_OTHER): Payer: Medicare Other

## 2022-08-07 DIAGNOSIS — Z23 Encounter for immunization: Secondary | ICD-10-CM | POA: Diagnosis not present

## 2022-08-08 DIAGNOSIS — D509 Iron deficiency anemia, unspecified: Secondary | ICD-10-CM | POA: Diagnosis not present

## 2022-08-08 DIAGNOSIS — D631 Anemia in chronic kidney disease: Secondary | ICD-10-CM | POA: Diagnosis not present

## 2022-08-08 DIAGNOSIS — Z992 Dependence on renal dialysis: Secondary | ICD-10-CM | POA: Diagnosis not present

## 2022-08-08 DIAGNOSIS — N186 End stage renal disease: Secondary | ICD-10-CM | POA: Diagnosis not present

## 2022-08-08 DIAGNOSIS — N2581 Secondary hyperparathyroidism of renal origin: Secondary | ICD-10-CM | POA: Diagnosis not present

## 2022-08-08 DIAGNOSIS — E876 Hypokalemia: Secondary | ICD-10-CM | POA: Diagnosis not present

## 2022-08-08 DIAGNOSIS — E079 Disorder of thyroid, unspecified: Secondary | ICD-10-CM | POA: Diagnosis not present

## 2022-08-11 DIAGNOSIS — D631 Anemia in chronic kidney disease: Secondary | ICD-10-CM | POA: Diagnosis not present

## 2022-08-11 DIAGNOSIS — Z992 Dependence on renal dialysis: Secondary | ICD-10-CM | POA: Diagnosis not present

## 2022-08-11 DIAGNOSIS — N2581 Secondary hyperparathyroidism of renal origin: Secondary | ICD-10-CM | POA: Diagnosis not present

## 2022-08-11 DIAGNOSIS — N186 End stage renal disease: Secondary | ICD-10-CM | POA: Diagnosis not present

## 2022-08-11 DIAGNOSIS — E079 Disorder of thyroid, unspecified: Secondary | ICD-10-CM | POA: Diagnosis not present

## 2022-08-11 DIAGNOSIS — E876 Hypokalemia: Secondary | ICD-10-CM | POA: Diagnosis not present

## 2022-08-11 DIAGNOSIS — D509 Iron deficiency anemia, unspecified: Secondary | ICD-10-CM | POA: Diagnosis not present

## 2022-08-12 DIAGNOSIS — Z905 Acquired absence of kidney: Secondary | ICD-10-CM | POA: Diagnosis not present

## 2022-08-12 DIAGNOSIS — Z08 Encounter for follow-up examination after completed treatment for malignant neoplasm: Secondary | ICD-10-CM | POA: Diagnosis not present

## 2022-08-12 DIAGNOSIS — Z85528 Personal history of other malignant neoplasm of kidney: Secondary | ICD-10-CM | POA: Diagnosis not present

## 2022-08-12 DIAGNOSIS — C642 Malignant neoplasm of left kidney, except renal pelvis: Secondary | ICD-10-CM | POA: Diagnosis not present

## 2022-08-12 DIAGNOSIS — N261 Atrophy of kidney (terminal): Secondary | ICD-10-CM | POA: Diagnosis not present

## 2022-08-12 DIAGNOSIS — Z7682 Awaiting organ transplant status: Secondary | ICD-10-CM | POA: Diagnosis not present

## 2022-08-13 DIAGNOSIS — Z992 Dependence on renal dialysis: Secondary | ICD-10-CM | POA: Diagnosis not present

## 2022-08-13 DIAGNOSIS — N2581 Secondary hyperparathyroidism of renal origin: Secondary | ICD-10-CM | POA: Diagnosis not present

## 2022-08-13 DIAGNOSIS — N186 End stage renal disease: Secondary | ICD-10-CM | POA: Diagnosis not present

## 2022-08-13 DIAGNOSIS — D631 Anemia in chronic kidney disease: Secondary | ICD-10-CM | POA: Diagnosis not present

## 2022-08-13 DIAGNOSIS — E079 Disorder of thyroid, unspecified: Secondary | ICD-10-CM | POA: Diagnosis not present

## 2022-08-13 DIAGNOSIS — E876 Hypokalemia: Secondary | ICD-10-CM | POA: Diagnosis not present

## 2022-08-13 DIAGNOSIS — D509 Iron deficiency anemia, unspecified: Secondary | ICD-10-CM | POA: Diagnosis not present

## 2022-08-14 DIAGNOSIS — N186 End stage renal disease: Secondary | ICD-10-CM | POA: Diagnosis not present

## 2022-08-14 DIAGNOSIS — Z992 Dependence on renal dialysis: Secondary | ICD-10-CM | POA: Diagnosis not present

## 2022-08-14 DIAGNOSIS — N2581 Secondary hyperparathyroidism of renal origin: Secondary | ICD-10-CM | POA: Diagnosis not present

## 2022-08-14 DIAGNOSIS — E8770 Fluid overload, unspecified: Secondary | ICD-10-CM | POA: Diagnosis not present

## 2022-08-15 DIAGNOSIS — D631 Anemia in chronic kidney disease: Secondary | ICD-10-CM | POA: Diagnosis not present

## 2022-08-15 DIAGNOSIS — N186 End stage renal disease: Secondary | ICD-10-CM | POA: Diagnosis not present

## 2022-08-15 DIAGNOSIS — Z992 Dependence on renal dialysis: Secondary | ICD-10-CM | POA: Diagnosis not present

## 2022-08-15 DIAGNOSIS — D509 Iron deficiency anemia, unspecified: Secondary | ICD-10-CM | POA: Diagnosis not present

## 2022-08-15 DIAGNOSIS — N2581 Secondary hyperparathyroidism of renal origin: Secondary | ICD-10-CM | POA: Diagnosis not present

## 2022-08-15 DIAGNOSIS — E079 Disorder of thyroid, unspecified: Secondary | ICD-10-CM | POA: Diagnosis not present

## 2022-08-15 DIAGNOSIS — E876 Hypokalemia: Secondary | ICD-10-CM | POA: Diagnosis not present

## 2022-08-18 ENCOUNTER — Other Ambulatory Visit: Payer: Self-pay

## 2022-08-18 ENCOUNTER — Ambulatory Visit (INDEPENDENT_AMBULATORY_CARE_PROVIDER_SITE_OTHER): Payer: Medicare Other

## 2022-08-18 DIAGNOSIS — Z23 Encounter for immunization: Secondary | ICD-10-CM

## 2022-08-18 DIAGNOSIS — Z992 Dependence on renal dialysis: Secondary | ICD-10-CM | POA: Diagnosis not present

## 2022-08-18 DIAGNOSIS — E876 Hypokalemia: Secondary | ICD-10-CM | POA: Diagnosis not present

## 2022-08-18 DIAGNOSIS — D631 Anemia in chronic kidney disease: Secondary | ICD-10-CM | POA: Diagnosis not present

## 2022-08-18 DIAGNOSIS — N2581 Secondary hyperparathyroidism of renal origin: Secondary | ICD-10-CM | POA: Diagnosis not present

## 2022-08-18 DIAGNOSIS — E079 Disorder of thyroid, unspecified: Secondary | ICD-10-CM | POA: Diagnosis not present

## 2022-08-18 DIAGNOSIS — N186 End stage renal disease: Secondary | ICD-10-CM | POA: Diagnosis not present

## 2022-08-18 DIAGNOSIS — D509 Iron deficiency anemia, unspecified: Secondary | ICD-10-CM | POA: Diagnosis not present

## 2022-08-18 NOTE — Progress Notes (Signed)
   Covid-19 Vaccination Clinic  Name:  Albert Eaton    MRN: [id number] DOB: 01-05-1966  08/18/2022  Albert Eaton was observed post Covid-19 immunization for 15 minutes without incident. He was provided with Vaccine Information Sheet and instruction to access the V-Safe system.   Albert Eaton was instructed to call 911 with any severe reactions post vaccine: Difficulty breathing  Swelling of face and throat  A fast heartbeat  A bad rash all over body

## 2022-08-18 NOTE — Pre-Procedure Instructions (Signed)
Surgical Instructions    Your procedure is scheduled on Thursday, October 26th.  Report to [facility] Main Entrance "A" at 05:30 A.M., then check in with the Admitting office.  Call this number if you have problems the morning of surgery:  [phone]   If you have any questions prior to your surgery date call [phone]: Open Monday-Friday 8am-4pm    Remember:  Do not eat or drink after midnight the night before your surgery    Take these medicines the morning of surgery with A SIP OF WATER  allopurinol (ZYLOPRIM)  BIKTARVY budesonide-formoterol (SYMBICORT) hydrALAZINE (APRESOLINE) levothyroxine (SYNTHROID)  metoprolol tartrate (LOPRESSOR)  rosuvastatin (CRESTOR)    If needed: ACETAMINOPHEN  azelastine (ASTELIN) famotidine (PEPCID)  gabapentin (NEURONTIN)  ipratropium (ATROVENT)  levalbuterol (XOPENEX HFA)- if needed, bring with you on day of surgery  Olopatadine HCl (PATADAY)   Follow your surgeon's instructions on when to stop Aspirin.  If no instructions were given by your surgeon then you will need to call the office to get those instructions.     As of today, STOP taking any Aleve, Naproxen, Ibuprofen, Motrin, Advil, Goody's, BC's, all herbal medications, fish oil, and all vitamins.                     Do NOT Smoke (Tobacco/Vaping) for 24 hours prior to your procedure.  If you use a CPAP at night, you may bring your mask/headgear for your overnight stay.   Contacts, glasses, piercing's, hearing aid's, dentures or partials may not be worn into surgery, please bring cases for these belongings.    For patients admitted to the hospital, discharge time will be determined by your treatment team.   Patients discharged the day of surgery will not be allowed to drive home, and someone needs to stay with them for 24 hours.  SURGICAL WAITING ROOM VISITATION Patients having surgery or a procedure may have no more than 2 support people in the waiting area - these  visitors may rotate.   Children under the age of 22 must have an adult with them who is not the patient. If the patient needs to stay at the hospital during part of their recovery, the visitor guidelines for inpatient rooms apply. Pre-op nurse will coordinate an appropriate time for 1 support person to accompany patient in pre-op.  This support person may not rotate.   Please refer to the [facility] website for the visitor guidelines for Inpatients (after your surgery is over and you are in a regular room).    Special instructions:   [place]- Preparing For Surgery  Before surgery, you can play an important role. Because skin is not sterile, your skin needs to be as free of germs as possible. You can reduce the number of germs on your skin by washing with CHG (chlorahexidine gluconate) Soap before surgery.  CHG is an antiseptic cleaner which kills germs and bonds with the skin to continue killing germs even after washing.    Oral Hygiene is also important to reduce your risk of infection.  Remember - BRUSH YOUR TEETH THE MORNING OF SURGERY WITH YOUR REGULAR TOOTHPASTE  Please do not use if you have an allergy to CHG or antibacterial soaps. If your skin becomes reddened/irritated stop using the CHG.  Do not shave (including legs and underarms) for at least 48 hours prior to first CHG shower. It is OK to shave your face.  Please follow these instructions carefully.   Shower the [employer]  SURGERY and the MORNING OF SURGERY  If you chose to wash your hair, wash your hair first as usual with your normal shampoo.  After you shampoo, rinse your hair and body thoroughly to remove the shampoo.  Use CHG Soap as you would any other liquid soap. You can apply CHG directly to the skin and wash gently with a scrungie or a clean washcloth.   Apply the CHG Soap to your body ONLY FROM THE NECK DOWN.  Do not use on open wounds or open sores. Avoid contact with your eyes, ears, mouth and genitals  (private parts). Wash Face and genitals (private parts)  with your normal soap.   Wash thoroughly, paying special attention to the area where your surgery will be performed.  Thoroughly rinse your body with warm water from the neck down.  DO NOT shower/wash with your normal soap after using and rinsing off the CHG Soap.  Pat yourself dry with a CLEAN TOWEL.  Wear CLEAN PAJAMAS to bed the night before surgery  Place CLEAN SHEETS on your bed the night before your surgery  DO NOT SLEEP WITH PETS.   Day of Surgery: Take a shower with CHG soap. Do not wear jewelry  Do not wear lotions, powders, colognes, or deodorant. Men may shave face and neck. Do not bring valuables to the hospital. [facility] is not responsible for any belongings or valuables.  Wear Clean/Comfortable clothing the morning of surgery Remember to brush your teeth WITH YOUR REGULAR TOOTHPASTE.   Please read over the following fact sheets that you were given.    If you received a COVID test during your pre-op visit  it is requested that you wear a mask when out in public, stay away from anyone that may not be feeling well and notify your surgeon if you develop symptoms. If you have been in contact with anyone that has tested positive in the last 10 days please notify you surgeon.

## 2022-08-19 ENCOUNTER — Ambulatory Visit (HOSPITAL_BASED_OUTPATIENT_CLINIC_OR_DEPARTMENT_OTHER)
Admission: RE | Admit: 2022-08-19 | Discharge: 2022-08-19 | Disposition: A | Discharge status: Home / Self Care | Payer: Medicare Other | Source: Ambulatory Visit | Attending: Thoracic Surgery (Cardiothoracic Vascular Surgery) | Admitting: Thoracic Surgery (Cardiothoracic Vascular Surgery)

## 2022-08-19 ENCOUNTER — Ambulatory Visit (HOSPITAL_COMMUNITY)
Admission: RE | Admit: 2022-08-19 | Discharge: 2022-08-19 | Disposition: A | Discharge status: Home / Self Care | Payer: Medicare Other | Source: Ambulatory Visit | Attending: Thoracic Surgery (Cardiothoracic Vascular Surgery) | Admitting: Thoracic Surgery (Cardiothoracic Vascular Surgery)

## 2022-08-19 ENCOUNTER — Encounter (HOSPITAL_COMMUNITY)
Admission: RE | Admit: 2022-08-19 | Discharge: 2022-08-19 | Disposition: A | Discharge status: Home / Self Care | Payer: Medicare Other | Source: Ambulatory Visit | Attending: Thoracic Surgery (Cardiothoracic Vascular Surgery) | Admitting: Thoracic Surgery (Cardiothoracic Vascular Surgery)

## 2022-08-19 ENCOUNTER — Other Ambulatory Visit: Payer: Self-pay

## 2022-08-19 ENCOUNTER — Encounter (HOSPITAL_COMMUNITY): Payer: Self-pay

## 2022-08-19 VITALS — BP 105/77 | HR 78 | Temp 97.8°F | Resp 18 | Ht 70.0 in | Wt 206.0 lb

## 2022-08-19 DIAGNOSIS — Z01818 Encounter for other preprocedural examination: Secondary | ICD-10-CM | POA: Insufficient documentation

## 2022-08-19 DIAGNOSIS — I132 Hypertensive heart and chronic kidney disease with heart failure and with stage 5 chronic kidney disease, or end stage renal disease: Secondary | ICD-10-CM | POA: Diagnosis not present

## 2022-08-19 DIAGNOSIS — Z1152 Encounter for screening for COVID-19: Secondary | ICD-10-CM | POA: Insufficient documentation

## 2022-08-19 DIAGNOSIS — Z992 Dependence on renal dialysis: Secondary | ICD-10-CM | POA: Diagnosis not present

## 2022-08-19 DIAGNOSIS — D631 Anemia in chronic kidney disease: Secondary | ICD-10-CM | POA: Diagnosis not present

## 2022-08-19 DIAGNOSIS — I10 Essential (primary) hypertension: Secondary | ICD-10-CM | POA: Insufficient documentation

## 2022-08-19 DIAGNOSIS — I2510 Atherosclerotic heart disease of native coronary artery without angina pectoris: Secondary | ICD-10-CM | POA: Insufficient documentation

## 2022-08-19 DIAGNOSIS — Z006 Encounter for examination for normal comparison and control in clinical research program: Secondary | ICD-10-CM | POA: Diagnosis not present

## 2022-08-19 DIAGNOSIS — G253 Myoclonus: Secondary | ICD-10-CM | POA: Diagnosis not present

## 2022-08-19 DIAGNOSIS — E8889 Other specified metabolic disorders: Secondary | ICD-10-CM | POA: Diagnosis not present

## 2022-08-19 DIAGNOSIS — E785 Hyperlipidemia, unspecified: Secondary | ICD-10-CM | POA: Insufficient documentation

## 2022-08-19 DIAGNOSIS — I340 Nonrheumatic mitral (valve) insufficiency: Secondary | ICD-10-CM

## 2022-08-19 DIAGNOSIS — I071 Rheumatic tricuspid insufficiency: Secondary | ICD-10-CM

## 2022-08-19 DIAGNOSIS — N186 End stage renal disease: Secondary | ICD-10-CM | POA: Diagnosis not present

## 2022-08-19 DIAGNOSIS — I081 Rheumatic disorders of both mitral and tricuspid valves: Secondary | ICD-10-CM | POA: Diagnosis not present

## 2022-08-19 DIAGNOSIS — E039 Hypothyroidism, unspecified: Secondary | ICD-10-CM | POA: Diagnosis not present

## 2022-08-19 DIAGNOSIS — Z20822 Contact with and (suspected) exposure to covid-19: Secondary | ICD-10-CM | POA: Diagnosis not present

## 2022-08-19 HISTORY — DX: Polyneuropathy, unspecified: G62.9

## 2022-08-19 LAB — CBC
HCT: 37.2 % — ABNORMAL LOW (ref 39.0–52.0)
Hemoglobin: 12.5 g/dL — ABNORMAL LOW (ref 13.0–17.0)
MCH: 31.5 pg (ref 26.0–34.0)
MCHC: 33.6 g/dL (ref 30.0–36.0)
MCV: 93.7 fL (ref 80.0–100.0)
Platelets: 303 10*3/uL (ref 150–400)
RBC: 3.97 MIL/uL — ABNORMAL LOW (ref 4.22–5.81)
RDW: 15.4 % (ref 11.5–15.5)
WBC: 6.9 10*3/uL (ref 4.0–10.5)
nRBC: 0.3 % — ABNORMAL HIGH (ref 0.0–0.2)

## 2022-08-19 LAB — COMPREHENSIVE METABOLIC PANEL
ALT: 17 U/L (ref 0–44)
AST: 15 U/L (ref 15–41)
Albumin: 4.5 g/dL (ref 3.5–5.0)
Alkaline Phosphatase: 144 U/L — ABNORMAL HIGH (ref 38–126)
Anion gap: 18 — ABNORMAL HIGH (ref 5–15)
BUN: 38 mg/dL — ABNORMAL HIGH (ref 6–20)
CO2: 21 mmol/L — ABNORMAL LOW (ref 22–32)
Calcium: 8.5 mg/dL — ABNORMAL LOW (ref 8.9–10.3)
Chloride: 95 mmol/L — ABNORMAL LOW (ref 98–111)
Creatinine, Ser: 9.65 mg/dL — ABNORMAL HIGH (ref 0.61–1.24)
GFR, Estimated: 6 mL/min — ABNORMAL LOW (ref >60)
Glucose, Bld: 74 mg/dL (ref 70–99)
Potassium: 3.7 mmol/L (ref 3.5–5.1)
Sodium: 134 mmol/L — ABNORMAL LOW (ref 135–145)
Total Bilirubin: 0.5 mg/dL (ref 0.3–1.2)
Total Protein: 8.6 g/dL — ABNORMAL HIGH (ref 6.5–8.1)

## 2022-08-19 LAB — SARS CORONAVIRUS 2 (TAT 6-24 HRS): SARS Coronavirus 2: NEGATIVE

## 2022-08-19 LAB — BLOOD GAS, ARTERIAL
Acid-Base Excess: 2.1 mmol/L — ABNORMAL HIGH (ref 0.0–2.0)
Bicarbonate: 25.5 mmol/L (ref 20.0–28.0)
Drawn by: 6643
O2 Saturation: 98.6 %
Patient temperature: 37
pCO2 arterial: 35 mmHg (ref 32–48)
pH, Arterial: 7.47 — ABNORMAL HIGH (ref 7.35–7.45)
pO2, Arterial: 94 mmHg (ref 83–108)

## 2022-08-19 LAB — SURGICAL PCR SCREEN
MRSA, PCR: NEGATIVE
Staphylococcus aureus: NEGATIVE

## 2022-08-19 LAB — HEMOGLOBIN A1C
Hgb A1c MFr Bld: 5.1 % (ref 4.8–5.6)
Mean Plasma Glucose: 99.67 mg/dL

## 2022-08-19 LAB — APTT: aPTT: 31 seconds (ref 24–36)

## 2022-08-19 LAB — PROTIME-INR
INR: 1.2 (ref 0.8–1.2)
Prothrombin Time: 14.7 seconds (ref 11.4–15.2)

## 2022-08-19 NOTE — Progress Notes (Signed)
PCP - Karl Ito NP Cardiologist - Dr. [name]  PPM/ICD - Denies Device Orders -  Rep Notified -   Chest x-ray - 08/19/22 EKG - 08/19/23 Stress Test - 01/31/20 ECHO - 05/28/22 Cardiac Cath -08/01/22   Sleep Study - Yes has OSA CPAP - CAnnot tolerate CPAP  DM - Denies  Blood Thinner Instructions: Heparin during dialysis M/W/F Aspirin Instructions: Requested patient to call Surgeon's office for instruction on when or if to stop.  ERAS Protcol -NI PRE-SURGERY Ensure or G2- NI  COVID TEST- 08/19/22   Anesthesia review: Yes cardiac history  Patient denies shortness of breath, fever, cough and chest pain at PAT appointment   All instructions explained to the patient, with a verbal understanding of the material. Patient agrees to go over the instructions while at home for a better understanding. Patient also recommended to wear a mask while in public after being tested for COVID-19. The opportunity to ask questions was provided.

## 2022-08-19 NOTE — Progress Notes (Signed)
Patient anuric, could not obtain sample.

## 2022-08-20 DIAGNOSIS — N186 End stage renal disease: Secondary | ICD-10-CM | POA: Diagnosis not present

## 2022-08-20 DIAGNOSIS — D631 Anemia in chronic kidney disease: Secondary | ICD-10-CM | POA: Diagnosis not present

## 2022-08-20 DIAGNOSIS — E079 Disorder of thyroid, unspecified: Secondary | ICD-10-CM | POA: Diagnosis not present

## 2022-08-20 DIAGNOSIS — Z992 Dependence on renal dialysis: Secondary | ICD-10-CM | POA: Diagnosis not present

## 2022-08-20 DIAGNOSIS — D509 Iron deficiency anemia, unspecified: Secondary | ICD-10-CM | POA: Diagnosis not present

## 2022-08-20 DIAGNOSIS — E876 Hypokalemia: Secondary | ICD-10-CM | POA: Diagnosis not present

## 2022-08-20 DIAGNOSIS — N2581 Secondary hyperparathyroidism of renal origin: Secondary | ICD-10-CM | POA: Diagnosis not present

## 2022-08-20 MED ORDER — MILRINONE LACTATE IN DEXTROSE 20-5 MG/100ML-% IV SOLN
0.3000 ug/kg/min | INTRAVENOUS | Status: DC
  Filled 2022-08-20: qty 100

## 2022-08-20 MED ORDER — VANCOMYCIN HCL 1000 MG IV SOLR
INTRAVENOUS | Status: DC
  Filled 2022-08-20: qty 20

## 2022-08-20 MED ORDER — TRANEXAMIC ACID (OHS) BOLUS VIA INFUSION
15.0000 mg/kg | INTRAVENOUS | Status: AC
  Administered 2022-08-21: 1401 mg via INTRAVENOUS
  Filled 2022-08-20: qty 1401

## 2022-08-20 MED ORDER — EPINEPHRINE HCL 5 MG/250ML IV SOLN IN NS
0.0000 ug/min | INTRAVENOUS | Status: DC
  Filled 2022-08-20: qty 250

## 2022-08-20 MED ORDER — MANNITOL 20 % IV SOLN
INTRAVENOUS | Status: DC
  Filled 2022-08-20: qty 13

## 2022-08-20 MED ORDER — NOREPINEPHRINE 4 MG/250ML-% IV SOLN
0.0000 ug/min | INTRAVENOUS | Status: AC
  Administered 2022-08-21: 3 ug/min via INTRAVENOUS
  Filled 2022-08-20: qty 250

## 2022-08-20 MED ORDER — POTASSIUM CHLORIDE 2 MEQ/ML IV SOLN
80.0000 meq | INTRAVENOUS | Status: DC
  Filled 2022-08-20: qty 40

## 2022-08-20 MED ORDER — TRANEXAMIC ACID (OHS) PUMP PRIME SOLUTION
2.0000 mg/kg | INTRAVENOUS | Status: DC
  Filled 2022-08-20: qty 1.87

## 2022-08-20 MED ORDER — CEFAZOLIN SODIUM-DEXTROSE 2-4 GM/100ML-% IV SOLN
2.0000 g | INTRAVENOUS | Status: DC
  Filled 2022-08-20: qty 100

## 2022-08-20 MED ORDER — HEPARIN 30,000 UNITS/1000 ML (OHS) CELLSAVER SOLUTION
Status: DC
  Filled 2022-08-20: qty 1000

## 2022-08-20 MED ORDER — VANCOMYCIN HCL 1250 MG/250ML IV SOLN
1250.0000 mg | INTRAVENOUS | Status: AC
  Administered 2022-08-21: 1250 mg via INTRAVENOUS
  Filled 2022-08-20 (×2): qty 250

## 2022-08-20 MED ORDER — INSULIN REGULAR(HUMAN) IN NACL 100-0.9 UT/100ML-% IV SOLN
INTRAVENOUS | Status: AC
  Administered 2022-08-21: 1 [IU]/h via INTRAVENOUS
  Filled 2022-08-20: qty 100

## 2022-08-20 MED ORDER — DEXMEDETOMIDINE HCL IN NACL 400 MCG/100ML IV SOLN
0.1000 ug/kg/h | INTRAVENOUS | Status: AC
  Administered 2022-08-21: .5 ug/kg/h via INTRAVENOUS
  Filled 2022-08-20: qty 100

## 2022-08-20 MED ORDER — PHENYLEPHRINE HCL-NACL 20-0.9 MG/250ML-% IV SOLN
30.0000 ug/min | INTRAVENOUS | Status: AC
  Administered 2022-08-21: 50 ug/min via INTRAVENOUS
  Filled 2022-08-20: qty 250

## 2022-08-20 MED ORDER — TRANEXAMIC ACID 1000 MG/10ML IV SOLN
1.5000 mg/kg/h | INTRAVENOUS | Status: AC
  Administered 2022-08-21: 1.5 mg/kg/h via INTRAVENOUS
  Filled 2022-08-20: qty 25

## 2022-08-20 MED ORDER — CEFAZOLIN SODIUM-DEXTROSE 2-4 GM/100ML-% IV SOLN
2.0000 g | INTRAVENOUS | Status: AC
  Administered 2022-08-21 (×2): 2 g via INTRAVENOUS
  Filled 2022-08-20: qty 100

## 2022-08-20 MED ORDER — PLASMA-LYTE A IV SOLN
INTRAVENOUS | Status: AC
  Administered 2022-08-21: 500 mL
  Filled 2022-08-20: qty 2.5

## 2022-08-20 NOTE — H&P (Signed)
Albert Eaton Record #[id number] Date of Birth: April 06, 1966   [name], MD [name], NP   Chief Complaint:   Dyspnea and CP     History of Present Illness:     Pt is a very pleasant 56 yo AAM that has been on dialysis since 2019 secondary to HTN and he is also sp Left nephrectomy for cancer. He has had atrial arrhythmias in the past that was determined best to be treated medically. He is normally in NSR and not on anticoagulation. He has been suffering more over the past 6 months with DOE that occurs when he pushes himself and going up flights of stairs. He also more recently has had intermittent CP that occurs at rest and with exertion. He had a cath last November with moderate disease throughout. 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 He had a dilated LA and in addition had moderate TR. Pt was felt at his age to best be served with repair and was referred for consideration. Pt feels that the hemodialysis has been better at keeping his volume status better managed than his peritoneal dialysis. Pt reports that he is also considered for renal transplant. He is having his teeth evaluated early next month               Past Medical History:  Diagnosis Date   Anemia     Aortic atherosclerosis (HCC)     Arthritis     Asthma     Cancer ([place])      renal cyst   Chronic diastolic CHF (congestive heart failure) ([place]) 01/06/2017    Echo 7/16 [facility] in [place], [name]) Mild AI, mild LAE, mild concentric LVH, EF 55, normal wall motion, mild to moderate MR, mild PI, RVSP 55 // Echo 10/09/16 ([facility]):  Moderate LVH, grade 2 diastolic dysfunction, mild MR, moderate LAE    CKD (chronic kidney disease)      Dialysis Mon Wed Fri   Esophagitis     GERD (gastroesophageal reflux disease)     Gout      no current problems   Heart murmur      never  caused any problems   Hepatitis      Hep B   History of nuclear stress test      a. Nuc study 7/16: no scar or ischemia, EF 42 // b. Nuc study 12/17: EF 48, ?small apical ischemia, Low Risk   HIV (human immunodeficiency virus infection) ([place])     HLD (hyperlipidemia)     Hypertension     Hypertensive heart disease with CHF (congestive heart failure) ([place]) 10/09/2016   Hypothyroidism     Mitral regurgitation     OSA (obstructive sleep apnea)     Post-operative nausea and vomiting      1 time as a child, no problems as an adult   Sleep apnea      uses c-pap   Thyroid disease     Wears glasses     Wears partial dentures             Past Surgical History:  Procedure Laterality Date   AV FISTULA PLACEMENT Left 07/02/2018    Procedure: Creation of Left arm BRACHIOBASILIC ARTERIOVENOUS  FISTULA;  Surgeon: Marty Heck, MD;  Location: [place];  Service: Vascular;  Laterality: Left;  BASCILIC VEIN TRANSPOSITION Left 08/27/2018    Procedure: Left arm BRACHIOBASILIC VEIN TRANSPOSITION SECOND STAGE;  Surgeon: Marty Heck, MD;  Location: [place];  Service: Vascular;  Laterality: Left;   BUBBLE STUDY   09/03/2020    Procedure: BUBBLE STUDY;  Surgeon: Fay Records, MD;  Location: [place];  Service: Cardiovascular;;   BUBBLE STUDY   06/03/2022    Procedure: BUBBLE STUDY;  Surgeon: Elouise Munroe, MD;  Location: [place];  Service: Cardiology;;   CAPD INSERTION N/A 05/30/2021    Procedure: LAPAROSCOPIC INSERTION CONTINUOUS AMBULATORY PERITONEAL DIALYSIS  (CAPD) CATHETER;  Surgeon: [name], MD;  Location: [place];  Service: Vascular;  Laterality: N/A;   CAPD REMOVAL N/A 02/13/2022    Procedure: PERITONEAL DIALYSIS CATHETER REMOVAL;  Surgeon: [name], MD;  Location[facility];  Service: Vascular;  Laterality: N/A;   CHOLECYSTECTOMY       COLONOSCOPY W/ BIOPSIES AND POLYPECTOMY       GIVENS CAPSULE STUDY N/A 03/01/2018    Procedure: GIVENS CAPSULE STUDY;   Surgeon: Jerene Bears, MD;  Location: [place];  Service: Gastroenterology;  Laterality: N/A;   HERNIA REPAIR        As baby   MULTIPLE TOOTH EXTRACTIONS       NEPHRECTOMY Left 02/18/2021   NOSE SURGERY       RENAL BIOPSY       RIGHT/LEFT HEART CATH AND CORONARY ANGIOGRAPHY N/A 09/04/2021    Procedure: RIGHT/LEFT HEART CATH AND CORONARY ANGIOGRAPHY;  Surgeon: Burnell Blanks, MD;  Location: [place] [facility];  Service: Cardiovascular;  Laterality: N/A;   TEE WITHOUT CARDIOVERSION N/A 09/03/2020    Procedure: TRANSESOPHAGEAL ECHOCARDIOGRAM (TEE);  Surgeon: Fay Records, MD;  Location: [place];  Service: Cardiovascular;  Laterality: N/A;   TEE WITHOUT CARDIOVERSION N/A 06/03/2022    Procedure: TRANSESOPHAGEAL ECHOCARDIOGRAM (TEE);  Surgeon: Elouise Munroe, MD;  Location: [place];  Service: Cardiology;  Laterality: N/A;   UPPER GI ENDOSCOPY   04/05/2021      Social History        Tobacco Use  Smoking Status Former   Packs/day: 1.00   Years: 18.00   Total pack years: 18.00   Types: Cigarettes   Quit date: 2000   Years since quitting: 23.7  Smokeless Tobacco Never    Social History       Substance and Sexual Activity  Alcohol Use Not Currently      Social History         Socioeconomic History   Marital status: Single      Spouse name: Not on file   Number of children: Not on file   Years of education: Not on file   Highest education level: Not on file  Occupational History   Not on file  Tobacco Use   Smoking status: Former      Packs/day: 1.00      Years: 18.00      Total pack years: 18.00      Types: Cigarettes      Quit date: 2000      Years since quitting: 23.7   Smokeless tobacco: Never  Vaping Use   Vaping Use: Never used  Substance and Sexual Activity   Alcohol use: Not Currently   Drug use: No   Sexual activity: Not Currently      Partners: Male  Other Topics Concern   Not on file  Social History Narrative    Disability  [place]: none    Social Determinants of Health        Financial Resource Strain: Low Risk  (07/08/2022)    Overall Financial Resource Strain (CARDIA)     Difficulty of Paying Living Expenses: Not hard at all  Food Insecurity: Food Insecurity Present (07/08/2022)    Hunger Vital Sign     Worried About Running Out of Food in the Last Year: Sometimes true     Ran Out of Food in the Last Year: Sometimes true  Transportation Needs: No Transportation Needs (07/08/2022)    PRAPARE - Armed forces logistics/support/administrative officer (Medical): No     Lack of Transportation (Non-Medical): No  Physical Activity: Insufficiently Active (07/08/2022)    Exercise Vital Sign     Days of Exercise per Week: 2 days     Minutes of Exercise per Session: 60 min  Stress: No Stress Concern Present (07/08/2022)    [place]     Feeling of Stress : Not at all  Social Connections: Socially Isolated (07/08/2022)    Social Connection and Isolation Panel [NHANES]     Frequency of Communication with Friends and Family: More than three times a week     Frequency of Social Gatherings with Friends and Family: More than three times a week     Attends Religious Services: Never     Marine scientist or Organizations: No     Attends Archivist Meetings: Never     Marital Status: Never married  Intimate Partner Violence: Not At Risk (07/08/2022)    Humiliation, Afraid, Rape, and Kick questionnaire     Fear of Current or Ex-Partner: No     Emotionally Abused: No     Physically Abused: No     Sexually Abused: No          Allergies  Allergen Reactions   Ace Inhibitors Cough and Other (See Comments)            Current Outpatient Medications  Medication Sig Dispense Refill   ACETAMINOPHEN EXTRA STRENGTH 500 MG tablet Take 1,000 mg by mouth every 6 (six) hours as needed (pain.).       allopurinol (ZYLOPRIM) 100 MG tablet TAKE 1 TABLET(100  MG) BY MOUTH TWICE DAILY FOR 60 DOSES 60 tablet 0   amLODipine (NORVASC) 10 MG tablet Take 1 tablet (10 mg total) by mouth daily. (Patient taking differently: Take 10 mg by mouth daily in the afternoon.) 30 tablet 1   aspirin EC 81 MG tablet Take 1 tablet (81 mg total) by mouth daily. Swallow whole. 90 tablet 3   azelastine (ASTELIN) 0.1 % nasal spray Place 2 sprays into both nostrils 2 (two) times daily as needed for allergies. 30 mL 5   BIKTARVY 50-200-25 MG TABS tablet TAKE ONE TABLET BY MOUTH EVERY DAY 30 tablet 5   budesonide-formoterol (SYMBICORT) 160-4.5 MCG/ACT inhaler Inhale 2 puffs into the lungs daily. 1 each 5   clobetasol (OLUX) 0.05 % topical foam Apply topically 2 (two) times daily. 50 g 2   desloratadine (CLARINEX) 5 MG tablet Take 1 tablet (5 mg total) by mouth daily. 30 tablet 5   famotidine (PEPCID) 20 MG tablet Take 1 tablet (20 mg total) by mouth 2 (two) times daily. (Patient taking differently: Take 20 mg by mouth 2 (two) times daily as needed for indigestion or heartburn.) 180 tablet 3   ferric citrate (AURYXIA) 1 GM 210 MG(Fe)  tablet Take 420 mg by mouth with breakfast, with lunch, and with evening meal.       fluticasone (FLOVENT HFA) 110 MCG/ACT inhaler Inhale 2 puffs into the lungs 2 (two) times daily. 12 g 5   gabapentin (NEURONTIN) 300 MG capsule TAKE 1 CAPSULE(300 MG) BY MOUTH THREE TIMES DAILY 90 capsule 3   hydrALAZINE (APRESOLINE) 100 MG tablet Take 100 mg by mouth 3 (three) times daily.       ipratropium (ATROVENT) 0.06 % nasal spray USE 2 SPRAYS IN EACH NOSTRIL 2 TO 3 TIMES DAILY AS NEEDED 15 mL 5   lactulose (CHRONULAC) 10 GM/15ML solution Take 20 g by mouth daily as needed for moderate constipation.        levalbuterol (XOPENEX HFA) 45 MCG/ACT inhaler Inhale 2 puffs into the lungs every 4 (four) hours as needed for wheezing. 15 g 1   levothyroxine (SYNTHROID) 25 MCG tablet Take 25 mcg by mouth daily before breakfast.       lubiprostone (AMITIZA) 24 MCG capsule  Take 1 capsule (24 mcg total) by mouth 2 (two) times daily with a meal. NEEDS OFFICE VISIT FOR FURTHER REFILLS (Patient taking differently: Take 24 mcg by mouth 2 (two) times daily as needed for constipation.) 60 capsule 1   methocarbamol (ROBAXIN) 500 MG tablet Take 1 tablet (500 mg total) by mouth 2 (two) times daily as needed for muscle spasms. 15 tablet 0   Methoxy PEG-Epoetin Beta (MIRCERA IJ) Inject 1 each into the skin every 30 (thirty) days. Administered at dialysis center once a month       metoprolol tartrate (LOPRESSOR) 25 MG tablet Take 12.5 mg by mouth 2 (two) times daily.       multivitamin (RENA-VIT) TABS tablet Take 1 tablet by mouth at bedtime.       OLOPATADINE HCL OP Place 1 drop into both eyes daily as needed (allergies).       pantoprazole (PROTONIX) 40 MG tablet Take 1 tablet (40 mg total) by mouth daily. **PLEASE CONTACT THE OFFICE TO SCHEDULE FOLLOW UP (Patient taking differently: Take 40 mg by mouth at bedtime.) 30 tablet 0   predniSONE (DELTASONE) 20 MG tablet Take 20 mg by mouth daily.       rOPINIRole (REQUIP) 1 MG tablet TAKE ONE TABLET BY MOUTH AT BEDTIME 90 tablet 2   rosuvastatin (CRESTOR) 10 MG tablet TAKE ONE TABLET BY MOUTH DAILY 90 tablet 3   SENSIPAR 60 MG tablet Take 1 tablet by mouth daily.       Spacer/Aero-Holding Chambers DEVI 1 Device by Does not apply route 2 (two) times a day. 1 each 1   Vitamin D, Ergocalciferol, (DRISDOL) 1.25 MG (50000 UNIT) CAPS capsule Take 50,000 Units by mouth every Tuesday.        No current facility-administered medications for this visit.             Family History  Problem Relation Age of Onset   Hypertension Mother     Heart failure Mother     Hypertension Father     Alcohol abuse Father     Hypertension Sister     Multiple sclerosis Sister     Hypertension Sister     Hypertension Brother     Heart attack Maternal Grandmother 79   Scoliosis Other     Allergic rhinitis Neg Hx     Angioedema Neg Hx     Asthma Neg  Hx     Eczema Neg Hx  Immunodeficiency Neg Hx     Urticaria Neg Hx     Colon cancer Neg Hx     Pancreatic cancer Neg Hx     Esophageal cancer Neg Hx              Physical Exam: There were no vitals taken for this visit. HEENT: teeth in good repair Lungs: clear Cardiac: RR with only a very soft murmur Ext: no edema       Diagnostic Studies & Laboratory data: I have personally reviewed the following studies and agree with the findings:    TEE (05/2022)  FINDINGS   Left Ventricle: Left ventricular ejection fraction, by estimation, is  55%. The left ventricle has normal function. The left ventricular internal  cavity size was mildly dilated.   Right Ventricle: The right ventricular size is mildly enlarged. No  increase in right ventricular wall thickness. Right ventricular systolic  function is normal.   Left Atrium: Left atrial size was severely dilated. No left atrial/left  atrial appendage thrombus was detected. The LAA emptying velocity was 62  cm/s.   Right Atrium: Right atrial size was moderately dilated.   Pericardium: There is no evidence of pericardial effusion.   Mitral Valve: Severe mitral valve regurgitation. Mechanism appears likely  atrial functional and MR is commissural. No definite pulmonary vein  reversals in systole, may be due to low blood pressure during sedation. At  ambulatory blood pressures, suspect  MR is severe. The mitral valve is grossly normal. Severe mitral valve  regurgitation. No evidence of mitral valve stenosis. MV peak gradient, 6.0  mmHg. The mean mitral valve gradient is 2.0 mmHg.   Tricuspid Valve: The tricuspid valve is grossly normal. Tricuspid valve  regurgitation is moderate.   Aortic Valve: The aortic valve is tricuspid. Aortic valve regurgitation is  trivial. No aortic stenosis is present.   Pulmonic Valve: The pulmonic valve was not well visualized. Pulmonic valve  regurgitation is mild.   Aorta: Aortic dilatation  noted. There is mild dilatation of the ascending  aorta, measuring 40 mm. There is mild (Grade II) atheroma plaque involving  the aortic arch and descending aorta.   IAS/Shunts: No atrial level shunt detected by color flow Doppler. Agitated  saline contrast was given intravenously to evaluate for intracardiac  shunting. Agitated saline contrast bubble study was negative, with no  evidence of any interatrial shunt.      LEFT VENTRICLE  PLAX 2D  LVOT diam:     2.00 cm  LV SV:         74  LV SV Index:   36  LVOT Area:     3.14 cm      AORTIC VALVE  LVOT Vmax:   130.00 cm/s  LVOT Vmean:  87.200 cm/s  LVOT VTI:    0.235 m     AORTA  Ao Root diam: 3.60 cm  Ao Asc diam:  4.00 cm   MITRAL VALVE  MV Area VTI:  2.93 cm     SHUNTS  MV Peak grad: 6.0 mmHg     Systemic VTI:  0.24 m  MV Mean grad: 2.0 mmHg     Systemic Diam: 2.00 cm  MV Vmax:      1.22 m/s  MV Vmean:     61.3 cm/s  MR Peak grad: 65.8 mmHg  MR Mean grad: 45.5 mmHg  MR Vmax:      405.50 cm/s  MR Vmean:     300.0 cm/s  MR  PISA:      3.08 cm    Cath (08/2021) Conclusion   Prox RCA lesion is 50% stenosed.   Mid LAD lesion is 50% stenosed.   Prox LAD lesion is 40% stenosed.   2nd Mrg lesion is 30% stenosed.   Mild to moderate non-obstructive CAD Normal right and left heart pressures   Continue medical management of CAD.  Hemo Data   Flowsheet Row Most Recent Value  Fick Cardiac Output 10.73 L/min  Fick Cardiac Output Index 5.06 (L/min)/BSA  RA A Wave 7 mmHg  RA V Wave 5 mmHg  RA Mean 3 mmHg  RV Systolic Pressure 30 mmHg  RV Diastolic Pressure 1 mmHg  RV EDP 6 mmHg  PA Systolic Pressure 20 mmHg  PA Diastolic Pressure 5 mmHg  PA Mean 11 mmHg  PW A Wave 14 mmHg  PW V Wave 11 mmHg  PW Mean 10 mmHg  AO Systolic Pressure 570 mmHg  AO Diastolic Pressure 71 mmHg  AO Mean 88 mmHg  LV Systolic Pressure 96 mmHg  LV Diastolic Pressure 10 mmHg  LV EDP 13 mmHg  AOp Systolic Pressure 177 mmHg  AOp Diastolic  Pressure 72 mmHg  AOp Mean Pressure 88 mmHg  LVp Systolic Pressure 939 mmHg  LVp Diastolic Pressure 11 mmHg  LVp EDP Pressure 15 mmHg  QP/QS 1  TPVR Index 3.55 HRUI  TSVR Index 17.38 HRUI  PVR SVR Ratio 0.09  TPVR/TSVR Ratio 0.2      Recent Lab Findings: Recent Labs       Lab Results  Component Value Date    WBC 6.6 05/28/2022    HGB 11.9 (L) 06/03/2022    HCT 35.0 (L) 06/03/2022    PLT 212 05/28/2022    GLUCOSE 95 06/03/2022    CHOL 113 04/17/2022    TRIG 131 04/17/2022    HDL 35 (L) 04/17/2022    LDLCALC 57 04/17/2022    ALT 9 04/17/2022    AST 12 04/17/2022    NA 141 06/03/2022    K 3.9 06/03/2022    CL 105 06/03/2022    CREATININE 12.20 (H) 06/03/2022    BUN 42 (H) 06/03/2022    CO2 21 05/28/2022    TSH 3.49 01/21/2022    INR 1.2 06/17/2021    HGBA1C 5.2 12/03/2021            Assessment / Plan:     Severe MR and moderate TR Pt has NYHA class 2 symptoms from what appears to be type I annular dilation MR. He has some evidence of posterior jet that really cant see anterior leaflet prolapse. The TR also appears to be Type I. We discussed the indications for surgery and options for minimal invasive incision vs sternotomy and feel that if no need for complete maze ablation procedure, the mini thorocotomy route should be able to perform the repairs. If unable to repair, his age and renal failure would best be to utilize a mechanical valve. He understands that this will require life long anticoagulation. We reviewed all the risks and goals of surgery and he wishes to proceed; I spent 65 min in review of the records and visualizing the studies, face to face with pt and in coordination of future care.    CAD  Secondary to moderate disease on last November cath, I feel it would be prudent with his symptoms of CP to have a repeat cath to evaluate CAD and Right heart pressures.  Renal Failure Will need to  arrange surgery the day following his dialysis  Dental care Pt with  broken tooth that he is having evaluated next week

## 2022-08-21 ENCOUNTER — Inpatient Hospital Stay (HOSPITAL_COMMUNITY): Payer: Medicare Other | Admitting: Anesthesiology

## 2022-08-21 ENCOUNTER — Inpatient Hospital Stay (HOSPITAL_COMMUNITY): Payer: Medicare Other

## 2022-08-21 ENCOUNTER — Other Ambulatory Visit: Payer: Self-pay

## 2022-08-21 ENCOUNTER — Inpatient Hospital Stay (HOSPITAL_COMMUNITY)
Admission: RE | Admit: 2022-08-21 | Discharge: 2022-08-27 | DRG: 219 | Disposition: A | Discharge status: Home / Self Care | Payer: Medicare Other | Attending: Thoracic Surgery (Cardiothoracic Vascular Surgery) | Admitting: Thoracic Surgery (Cardiothoracic Vascular Surgery)

## 2022-08-21 ENCOUNTER — Encounter (HOSPITAL_COMMUNITY)
Admission: RE | Disposition: A | Discharge status: Home / Self Care | Payer: Self-pay | Source: Home / Self Care | Attending: Thoracic Surgery (Cardiothoracic Vascular Surgery)

## 2022-08-21 ENCOUNTER — Encounter (HOSPITAL_COMMUNITY): Payer: Self-pay | Admitting: Thoracic Surgery (Cardiothoracic Vascular Surgery)

## 2022-08-21 ENCOUNTER — Inpatient Hospital Stay (HOSPITAL_COMMUNITY): Payer: Medicare Other | Admitting: Vascular Surgery

## 2022-08-21 DIAGNOSIS — Z20822 Contact with and (suspected) exposure to covid-19: Secondary | ICD-10-CM | POA: Diagnosis present

## 2022-08-21 DIAGNOSIS — E8889 Other specified metabolic disorders: Secondary | ICD-10-CM | POA: Diagnosis present

## 2022-08-21 DIAGNOSIS — Z7952 Long term (current) use of systemic steroids: Secondary | ICD-10-CM

## 2022-08-21 DIAGNOSIS — K219 Gastro-esophageal reflux disease without esophagitis: Secondary | ICD-10-CM | POA: Diagnosis present

## 2022-08-21 DIAGNOSIS — Z992 Dependence on renal dialysis: Secondary | ICD-10-CM | POA: Diagnosis not present

## 2022-08-21 DIAGNOSIS — Z21 Asymptomatic human immunodeficiency virus [HIV] infection status: Secondary | ICD-10-CM | POA: Diagnosis present

## 2022-08-21 DIAGNOSIS — Z8249 Family history of ischemic heart disease and other diseases of the circulatory system: Secondary | ICD-10-CM

## 2022-08-21 DIAGNOSIS — Z905 Acquired absence of kidney: Secondary | ICD-10-CM | POA: Diagnosis not present

## 2022-08-21 DIAGNOSIS — N186 End stage renal disease: Secondary | ICD-10-CM | POA: Diagnosis present

## 2022-08-21 DIAGNOSIS — G253 Myoclonus: Secondary | ICD-10-CM | POA: Diagnosis not present

## 2022-08-21 DIAGNOSIS — X58XXXA Exposure to other specified factors, initial encounter: Secondary | ICD-10-CM | POA: Diagnosis present

## 2022-08-21 DIAGNOSIS — I340 Nonrheumatic mitral (valve) insufficiency: Secondary | ICD-10-CM | POA: Diagnosis not present

## 2022-08-21 DIAGNOSIS — I071 Rheumatic tricuspid insufficiency: Secondary | ICD-10-CM

## 2022-08-21 DIAGNOSIS — Z952 Presence of prosthetic heart valve: Secondary | ICD-10-CM | POA: Diagnosis not present

## 2022-08-21 DIAGNOSIS — R0681 Apnea, not elsewhere classified: Secondary | ICD-10-CM

## 2022-08-21 DIAGNOSIS — G629 Polyneuropathy, unspecified: Secondary | ICD-10-CM | POA: Diagnosis present

## 2022-08-21 DIAGNOSIS — I2510 Atherosclerotic heart disease of native coronary artery without angina pectoris: Secondary | ICD-10-CM | POA: Diagnosis present

## 2022-08-21 DIAGNOSIS — Z006 Encounter for examination for normal comparison and control in clinical research program: Secondary | ICD-10-CM | POA: Diagnosis not present

## 2022-08-21 DIAGNOSIS — I493 Ventricular premature depolarization: Secondary | ICD-10-CM | POA: Diagnosis present

## 2022-08-21 DIAGNOSIS — R4189 Other symptoms and signs involving cognitive functions and awareness: Secondary | ICD-10-CM | POA: Diagnosis not present

## 2022-08-21 DIAGNOSIS — Z7401 Bed confinement status: Secondary | ICD-10-CM | POA: Diagnosis not present

## 2022-08-21 DIAGNOSIS — Z79899 Other long term (current) drug therapy: Secondary | ICD-10-CM

## 2022-08-21 DIAGNOSIS — Z9889 Other specified postprocedural states: Secondary | ICD-10-CM | POA: Diagnosis not present

## 2022-08-21 DIAGNOSIS — I132 Hypertensive heart and chronic kidney disease with heart failure and with stage 5 chronic kidney disease, or end stage renal disease: Secondary | ICD-10-CM | POA: Diagnosis present

## 2022-08-21 DIAGNOSIS — R29818 Other symptoms and signs involving the nervous system: Secondary | ICD-10-CM | POA: Diagnosis not present

## 2022-08-21 DIAGNOSIS — Z7982 Long term (current) use of aspirin: Secondary | ICD-10-CM

## 2022-08-21 DIAGNOSIS — D631 Anemia in chronic kidney disease: Secondary | ICD-10-CM | POA: Diagnosis present

## 2022-08-21 DIAGNOSIS — G4733 Obstructive sleep apnea (adult) (pediatric): Secondary | ICD-10-CM | POA: Diagnosis present

## 2022-08-21 DIAGNOSIS — I129 Hypertensive chronic kidney disease with stage 1 through stage 4 chronic kidney disease, or unspecified chronic kidney disease: Secondary | ICD-10-CM | POA: Diagnosis not present

## 2022-08-21 DIAGNOSIS — Z85528 Personal history of other malignant neoplasm of kidney: Secondary | ICD-10-CM

## 2022-08-21 DIAGNOSIS — R4182 Altered mental status, unspecified: Secondary | ICD-10-CM

## 2022-08-21 DIAGNOSIS — I639 Cerebral infarction, unspecified: Secondary | ICD-10-CM | POA: Diagnosis not present

## 2022-08-21 DIAGNOSIS — E785 Hyperlipidemia, unspecified: Secondary | ICD-10-CM | POA: Diagnosis present

## 2022-08-21 DIAGNOSIS — S025XXA Fracture of tooth (traumatic), initial encounter for closed fracture: Secondary | ICD-10-CM | POA: Diagnosis present

## 2022-08-21 DIAGNOSIS — R0602 Shortness of breath: Secondary | ICD-10-CM | POA: Diagnosis not present

## 2022-08-21 DIAGNOSIS — J45909 Unspecified asthma, uncomplicated: Secondary | ICD-10-CM | POA: Diagnosis present

## 2022-08-21 DIAGNOSIS — E039 Hypothyroidism, unspecified: Secondary | ICD-10-CM | POA: Diagnosis not present

## 2022-08-21 DIAGNOSIS — Z8619 Personal history of other infectious and parasitic diseases: Secondary | ICD-10-CM | POA: Diagnosis not present

## 2022-08-21 DIAGNOSIS — Z452 Encounter for adjustment and management of vascular access device: Secondary | ICD-10-CM | POA: Diagnosis not present

## 2022-08-21 DIAGNOSIS — I081 Rheumatic disorders of both mitral and tricuspid valves: Principal | ICD-10-CM | POA: Diagnosis present

## 2022-08-21 DIAGNOSIS — N250 Renal osteodystrophy: Secondary | ICD-10-CM | POA: Diagnosis not present

## 2022-08-21 DIAGNOSIS — I5032 Chronic diastolic (congestive) heart failure: Secondary | ICD-10-CM | POA: Diagnosis not present

## 2022-08-21 DIAGNOSIS — Z87891 Personal history of nicotine dependence: Secondary | ICD-10-CM | POA: Diagnosis not present

## 2022-08-21 DIAGNOSIS — I517 Cardiomegaly: Secondary | ICD-10-CM | POA: Diagnosis not present

## 2022-08-21 DIAGNOSIS — R011 Cardiac murmur, unspecified: Secondary | ICD-10-CM | POA: Diagnosis present

## 2022-08-21 DIAGNOSIS — R918 Other nonspecific abnormal finding of lung field: Secondary | ICD-10-CM | POA: Diagnosis not present

## 2022-08-21 DIAGNOSIS — Z888 Allergy status to other drugs, medicaments and biological substances status: Secondary | ICD-10-CM

## 2022-08-21 DIAGNOSIS — I509 Heart failure, unspecified: Secondary | ICD-10-CM | POA: Diagnosis not present

## 2022-08-21 DIAGNOSIS — Z9911 Dependence on respirator [ventilator] status: Secondary | ICD-10-CM | POA: Diagnosis not present

## 2022-08-21 DIAGNOSIS — M109 Gout, unspecified: Secondary | ICD-10-CM | POA: Diagnosis present

## 2022-08-21 DIAGNOSIS — Z7951 Long term (current) use of inhaled steroids: Secondary | ICD-10-CM

## 2022-08-21 DIAGNOSIS — J9690 Respiratory failure, unspecified, unspecified whether with hypoxia or hypercapnia: Secondary | ICD-10-CM | POA: Diagnosis not present

## 2022-08-21 DIAGNOSIS — Z4682 Encounter for fitting and adjustment of non-vascular catheter: Secondary | ICD-10-CM | POA: Diagnosis not present

## 2022-08-21 DIAGNOSIS — R569 Unspecified convulsions: Secondary | ICD-10-CM | POA: Diagnosis not present

## 2022-08-21 DIAGNOSIS — I6523 Occlusion and stenosis of bilateral carotid arteries: Secondary | ICD-10-CM | POA: Diagnosis not present

## 2022-08-21 DIAGNOSIS — M1990 Unspecified osteoarthritis, unspecified site: Secondary | ICD-10-CM | POA: Diagnosis present

## 2022-08-21 DIAGNOSIS — Z79890 Hormone replacement therapy (postmenopausal): Secondary | ICD-10-CM

## 2022-08-21 DIAGNOSIS — I63442 Cerebral infarction due to embolism of left cerebellar artery: Secondary | ICD-10-CM | POA: Diagnosis not present

## 2022-08-21 HISTORY — PX: MITRAL VALVE REPAIR: SHX2039

## 2022-08-21 HISTORY — PX: TEE WITHOUT CARDIOVERSION: SHX5443

## 2022-08-21 LAB — POCT I-STAT, CHEM 8
BUN: 38 mg/dL — ABNORMAL HIGH (ref 6–20)
BUN: 36 mg/dL — ABNORMAL HIGH (ref 6–20)
BUN: 37 mg/dL — ABNORMAL HIGH (ref 6–20)
BUN: 35 mg/dL — ABNORMAL HIGH (ref 6–20)
BUN: 36 mg/dL — ABNORMAL HIGH (ref 6–20)
BUN: 36 mg/dL — ABNORMAL HIGH (ref 6–20)
Calcium, Ion: 0.99 mmol/L — ABNORMAL LOW (ref 1.15–1.40)
Calcium, Ion: 1.01 mmol/L — ABNORMAL LOW (ref 1.15–1.40)
Calcium, Ion: 1.01 mmol/L — ABNORMAL LOW (ref 1.15–1.40)
Calcium, Ion: 0.94 mmol/L — ABNORMAL LOW (ref 1.15–1.40)
Calcium, Ion: 0.91 mmol/L — ABNORMAL LOW (ref 1.15–1.40)
Calcium, Ion: 0.95 mmol/L — ABNORMAL LOW (ref 1.15–1.40)
Chloride: 96 mmol/L — ABNORMAL LOW (ref 98–111)
Chloride: 96 mmol/L — ABNORMAL LOW (ref 98–111)
Chloride: 97 mmol/L — ABNORMAL LOW (ref 98–111)
Chloride: 98 mmol/L (ref 98–111)
Chloride: 96 mmol/L — ABNORMAL LOW (ref 98–111)
Chloride: 96 mmol/L — ABNORMAL LOW (ref 98–111)
Creatinine, Ser: 9.4 mg/dL — ABNORMAL HIGH (ref 0.61–1.24)
Creatinine, Ser: 9 mg/dL — ABNORMAL HIGH (ref 0.61–1.24)
Creatinine, Ser: 9.3 mg/dL — ABNORMAL HIGH (ref 0.61–1.24)
Creatinine, Ser: 9 mg/dL — ABNORMAL HIGH (ref 0.61–1.24)
Creatinine, Ser: 8.5 mg/dL — ABNORMAL HIGH (ref 0.61–1.24)
Creatinine, Ser: 8.8 mg/dL — ABNORMAL HIGH (ref 0.61–1.24)
Glucose, Bld: 94 mg/dL (ref 70–99)
Glucose, Bld: 116 mg/dL — ABNORMAL HIGH (ref 70–99)
Glucose, Bld: 89 mg/dL (ref 70–99)
Glucose, Bld: 136 mg/dL — ABNORMAL HIGH (ref 70–99)
Glucose, Bld: 137 mg/dL — ABNORMAL HIGH (ref 70–99)
Glucose, Bld: 144 mg/dL — ABNORMAL HIGH (ref 70–99)
HCT: 35 % — ABNORMAL LOW (ref 39.0–52.0)
HCT: 31 % — ABNORMAL LOW (ref 39.0–52.0)
HCT: 30 % — ABNORMAL LOW (ref 39.0–52.0)
HCT: 25 % — ABNORMAL LOW (ref 39.0–52.0)
HCT: 25 % — ABNORMAL LOW (ref 39.0–52.0)
HCT: 29 % — ABNORMAL LOW (ref 39.0–52.0)
Hemoglobin: 11.9 g/dL — ABNORMAL LOW (ref 13.0–17.0)
Hemoglobin: 10.5 g/dL — ABNORMAL LOW (ref 13.0–17.0)
Hemoglobin: 10.2 g/dL — ABNORMAL LOW (ref 13.0–17.0)
Hemoglobin: 8.5 g/dL — ABNORMAL LOW (ref 13.0–17.0)
Hemoglobin: 8.5 g/dL — ABNORMAL LOW (ref 13.0–17.0)
Hemoglobin: 9.9 g/dL — ABNORMAL LOW (ref 13.0–17.0)
Potassium: 3.4 mmol/L — ABNORMAL LOW (ref 3.5–5.1)
Potassium: 3.1 mmol/L — ABNORMAL LOW (ref 3.5–5.1)
Potassium: 3.2 mmol/L — ABNORMAL LOW (ref 3.5–5.1)
Potassium: 4.3 mmol/L (ref 3.5–5.1)
Potassium: 4 mmol/L (ref 3.5–5.1)
Potassium: 3.2 mmol/L — ABNORMAL LOW (ref 3.5–5.1)
Sodium: 139 mmol/L (ref 135–145)
Sodium: 137 mmol/L (ref 135–145)
Sodium: 137 mmol/L (ref 135–145)
Sodium: 137 mmol/L (ref 135–145)
Sodium: 135 mmol/L (ref 135–145)
Sodium: 137 mmol/L (ref 135–145)
TCO2: 33 mmol/L — ABNORMAL HIGH (ref 22–32)
TCO2: 27 mmol/L (ref 22–32)
TCO2: 27 mmol/L (ref 22–32)
TCO2: 25 mmol/L (ref 22–32)
TCO2: 26 mmol/L (ref 22–32)
TCO2: 25 mmol/L (ref 22–32)

## 2022-08-21 LAB — GLUCOSE, CAPILLARY
Glucose-Capillary: 106 mg/dL — ABNORMAL HIGH (ref 70–99)
Glucose-Capillary: 108 mg/dL — ABNORMAL HIGH (ref 70–99)
Glucose-Capillary: 119 mg/dL — ABNORMAL HIGH (ref 70–99)
Glucose-Capillary: 125 mg/dL — ABNORMAL HIGH (ref 70–99)
Glucose-Capillary: 119 mg/dL — ABNORMAL HIGH (ref 70–99)
Glucose-Capillary: 117 mg/dL — ABNORMAL HIGH (ref 70–99)
Glucose-Capillary: 112 mg/dL — ABNORMAL HIGH (ref 70–99)

## 2022-08-21 LAB — POCT I-STAT 7, (LYTES, BLD GAS, ICA,H+H)
Acid-Base Excess: 2 mmol/L (ref 0.0–2.0)
Acid-Base Excess: 3 mmol/L — ABNORMAL HIGH (ref 0.0–2.0)
Acid-Base Excess: 2 mmol/L (ref 0.0–2.0)
Acid-base deficit: 1 mmol/L (ref 0.0–2.0)
Acid-base deficit: 3 mmol/L — ABNORMAL HIGH (ref 0.0–2.0)
Acid-base deficit: 2 mmol/L (ref 0.0–2.0)
Acid-base deficit: 4 mmol/L — ABNORMAL HIGH (ref 0.0–2.0)
Acid-base deficit: 3 mmol/L — ABNORMAL HIGH (ref 0.0–2.0)
Bicarbonate: 27 mmol/L (ref 20.0–28.0)
Bicarbonate: 27.7 mmol/L (ref 20.0–28.0)
Bicarbonate: 27.7 mmol/L (ref 20.0–28.0)
Bicarbonate: 24.2 mmol/L (ref 20.0–28.0)
Bicarbonate: 24 mmol/L (ref 20.0–28.0)
Bicarbonate: 24.3 mmol/L (ref 20.0–28.0)
Bicarbonate: 21.9 mmol/L (ref 20.0–28.0)
Bicarbonate: 21.6 mmol/L (ref 20.0–28.0)
Calcium, Ion: 1.02 mmol/L — ABNORMAL LOW (ref 1.15–1.40)
Calcium, Ion: 0.9 mmol/L — ABNORMAL LOW (ref 1.15–1.40)
Calcium, Ion: 0.94 mmol/L — ABNORMAL LOW (ref 1.15–1.40)
Calcium, Ion: 0.92 mmol/L — ABNORMAL LOW (ref 1.15–1.40)
Calcium, Ion: 0.96 mmol/L — ABNORMAL LOW (ref 1.15–1.40)
Calcium, Ion: 0.95 mmol/L — ABNORMAL LOW (ref 1.15–1.40)
Calcium, Ion: 1.01 mmol/L — ABNORMAL LOW (ref 1.15–1.40)
Calcium, Ion: 0.95 mmol/L — ABNORMAL LOW (ref 1.15–1.40)
HCT: 31 % — ABNORMAL LOW (ref 39.0–52.0)
HCT: 23 % — ABNORMAL LOW (ref 39.0–52.0)
HCT: 26 % — ABNORMAL LOW (ref 39.0–52.0)
HCT: 26 % — ABNORMAL LOW (ref 39.0–52.0)
HCT: 30 % — ABNORMAL LOW (ref 39.0–52.0)
HCT: 33 % — ABNORMAL LOW (ref 39.0–52.0)
HCT: 30 % — ABNORMAL LOW (ref 39.0–52.0)
HCT: 31 % — ABNORMAL LOW (ref 39.0–52.0)
Hemoglobin: 10.5 g/dL — ABNORMAL LOW (ref 13.0–17.0)
Hemoglobin: 7.8 g/dL — ABNORMAL LOW (ref 13.0–17.0)
Hemoglobin: 8.8 g/dL — ABNORMAL LOW (ref 13.0–17.0)
Hemoglobin: 8.8 g/dL — ABNORMAL LOW (ref 13.0–17.0)
Hemoglobin: 10.2 g/dL — ABNORMAL LOW (ref 13.0–17.0)
Hemoglobin: 11.2 g/dL — ABNORMAL LOW (ref 13.0–17.0)
Hemoglobin: 10.2 g/dL — ABNORMAL LOW (ref 13.0–17.0)
Hemoglobin: 10.5 g/dL — ABNORMAL LOW (ref 13.0–17.0)
O2 Saturation: 100 %
O2 Saturation: 100 %
O2 Saturation: 100 %
O2 Saturation: 100 %
O2 Saturation: 96 %
O2 Saturation: 97 %
O2 Saturation: 97 %
O2 Saturation: 100 %
Patient temperature: 36.3
Patient temperature: 36.5
Patient temperature: 36.4
Potassium: 3.1 mmol/L — ABNORMAL LOW (ref 3.5–5.1)
Potassium: 3.4 mmol/L — ABNORMAL LOW (ref 3.5–5.1)
Potassium: 3.6 mmol/L (ref 3.5–5.1)
Potassium: 3.9 mmol/L (ref 3.5–5.1)
Potassium: 3.2 mmol/L — ABNORMAL LOW (ref 3.5–5.1)
Potassium: 3.4 mmol/L — ABNORMAL LOW (ref 3.5–5.1)
Potassium: 4 mmol/L (ref 3.5–5.1)
Potassium: 4.3 mmol/L (ref 3.5–5.1)
Sodium: 137 mmol/L (ref 135–145)
Sodium: 138 mmol/L (ref 135–145)
Sodium: 136 mmol/L (ref 135–145)
Sodium: 134 mmol/L — ABNORMAL LOW (ref 135–145)
Sodium: 137 mmol/L (ref 135–145)
Sodium: 137 mmol/L (ref 135–145)
Sodium: 137 mmol/L (ref 135–145)
Sodium: 133 mmol/L — ABNORMAL LOW (ref 135–145)
TCO2: 28 mmol/L (ref 22–32)
TCO2: 29 mmol/L (ref 22–32)
TCO2: 29 mmol/L (ref 22–32)
TCO2: 26 mmol/L (ref 22–32)
TCO2: 25 mmol/L (ref 22–32)
TCO2: 26 mmol/L (ref 22–32)
TCO2: 23 mmol/L (ref 22–32)
TCO2: 23 mmol/L (ref 22–32)
pCO2 arterial: 42.7 mmHg (ref 32–48)
pCO2 arterial: 43.3 mmHg (ref 32–48)
pCO2 arterial: 48.3 mmHg — ABNORMAL HIGH (ref 32–48)
pCO2 arterial: 43.7 mmHg (ref 32–48)
pCO2 arterial: 50.5 mmHg — ABNORMAL HIGH (ref 32–48)
pCO2 arterial: 43.3 mmHg (ref 32–48)
pCO2 arterial: 42.1 mmHg (ref 32–48)
pCO2 arterial: 34.1 mmHg (ref 32–48)
pH, Arterial: 7.408 (ref 7.35–7.45)
pH, Arterial: 7.414 (ref 7.35–7.45)
pH, Arterial: 7.367 (ref 7.35–7.45)
pH, Arterial: 7.352 (ref 7.35–7.45)
pH, Arterial: 7.284 — ABNORMAL LOW (ref 7.35–7.45)
pH, Arterial: 7.354 (ref 7.35–7.45)
pH, Arterial: 7.321 — ABNORMAL LOW (ref 7.35–7.45)
pH, Arterial: 7.407 (ref 7.35–7.45)
pO2, Arterial: 268 mmHg — ABNORMAL HIGH (ref 83–108)
pO2, Arterial: 428 mmHg — ABNORMAL HIGH (ref 83–108)
pO2, Arterial: 378 mmHg — ABNORMAL HIGH (ref 83–108)
pO2, Arterial: 444 mmHg — ABNORMAL HIGH (ref 83–108)
pO2, Arterial: 94 mmHg (ref 83–108)
pO2, Arterial: 89 mmHg (ref 83–108)
pO2, Arterial: 92 mmHg (ref 83–108)
pO2, Arterial: 347 mmHg — ABNORMAL HIGH (ref 83–108)

## 2022-08-21 LAB — CBC
HCT: 31.8 % — ABNORMAL LOW (ref 39.0–52.0)
HCT: 30.6 % — ABNORMAL LOW (ref 39.0–52.0)
Hemoglobin: 10.7 g/dL — ABNORMAL LOW (ref 13.0–17.0)
Hemoglobin: 10.2 g/dL — ABNORMAL LOW (ref 13.0–17.0)
MCH: 31.7 pg (ref 26.0–34.0)
MCH: 31.2 pg (ref 26.0–34.0)
MCHC: 33.6 g/dL (ref 30.0–36.0)
MCHC: 33.3 g/dL (ref 30.0–36.0)
MCV: 94.1 fL (ref 80.0–100.0)
MCV: 93.6 fL (ref 80.0–100.0)
Platelets: 177 10*3/uL (ref 150–400)
Platelets: 184 10*3/uL (ref 150–400)
RBC: 3.38 MIL/uL — ABNORMAL LOW (ref 4.22–5.81)
RBC: 3.27 MIL/uL — ABNORMAL LOW (ref 4.22–5.81)
RDW: 15.6 % — ABNORMAL HIGH (ref 11.5–15.5)
RDW: 16.1 % — ABNORMAL HIGH (ref 11.5–15.5)
WBC: 12.2 10*3/uL — ABNORMAL HIGH (ref 4.0–10.5)
WBC: 10.1 10*3/uL (ref 4.0–10.5)
nRBC: 0.7 % — ABNORMAL HIGH (ref 0.0–0.2)
nRBC: 0 % (ref 0.0–0.2)

## 2022-08-21 LAB — PLATELET COUNT: Platelets: 208 10*3/uL (ref 150–400)

## 2022-08-21 LAB — MAGNESIUM: Magnesium: 3.1 mg/dL — ABNORMAL HIGH (ref 1.7–2.4)

## 2022-08-21 LAB — PROTIME-INR
INR: 1.5 — ABNORMAL HIGH (ref 0.8–1.2)
Prothrombin Time: 17.8 seconds — ABNORMAL HIGH (ref 11.4–15.2)

## 2022-08-21 LAB — POCT I-STAT EG7
Acid-base deficit: 1 mmol/L (ref 0.0–2.0)
Bicarbonate: 23.8 mmol/L (ref 20.0–28.0)
Calcium, Ion: 0.96 mmol/L — ABNORMAL LOW (ref 1.15–1.40)
HCT: 26 % — ABNORMAL LOW (ref 39.0–52.0)
Hemoglobin: 8.8 g/dL — ABNORMAL LOW (ref 13.0–17.0)
O2 Saturation: 74 %
Potassium: 3.6 mmol/L (ref 3.5–5.1)
Sodium: 138 mmol/L (ref 135–145)
TCO2: 25 mmol/L (ref 22–32)
pCO2, Ven: 41.2 mmHg — ABNORMAL LOW (ref 44–60)
pH, Ven: 7.37 (ref 7.25–7.43)
pO2, Ven: 40 mmHg (ref 32–45)

## 2022-08-21 LAB — BASIC METABOLIC PANEL
Anion gap: 14 (ref 5–15)
BUN: 40 mg/dL — ABNORMAL HIGH (ref 6–20)
CO2: 20 mmol/L — ABNORMAL LOW (ref 22–32)
Calcium: 7.1 mg/dL — ABNORMAL LOW (ref 8.9–10.3)
Chloride: 100 mmol/L (ref 98–111)
Creatinine, Ser: 8.6 mg/dL — ABNORMAL HIGH (ref 0.61–1.24)
GFR, Estimated: 7 mL/min — ABNORMAL LOW (ref >60)
Glucose, Bld: 108 mg/dL — ABNORMAL HIGH (ref 70–99)
Potassium: 4.2 mmol/L (ref 3.5–5.1)
Sodium: 134 mmol/L — ABNORMAL LOW (ref 135–145)

## 2022-08-21 LAB — APTT: aPTT: 32 seconds (ref 24–36)

## 2022-08-21 LAB — ABO/RH: ABO/RH(D): B POS

## 2022-08-21 LAB — PREPARE RBC (CROSSMATCH)

## 2022-08-21 LAB — HEMOGLOBIN AND HEMATOCRIT, BLOOD
HCT: 24.9 % — ABNORMAL LOW (ref 39.0–52.0)
Hemoglobin: 8.4 g/dL — ABNORMAL LOW (ref 13.0–17.0)

## 2022-08-21 SURGERY — REPAIR, MITRAL VALVE, MINIMALLY INVASIVE
Anesthesia: General

## 2022-08-21 MED ORDER — LACTATED RINGERS IV SOLN: INTRAVENOUS | Status: DC

## 2022-08-21 MED ORDER — ROCURONIUM BROMIDE 10 MG/ML (PF) SYRINGE
PREFILLED_SYRINGE | INTRAVENOUS | Status: AC
  Filled 2022-08-21: qty 10

## 2022-08-21 MED ORDER — ACETAMINOPHEN 650 MG RE SUPP
650.0000 mg | Freq: Once | RECTAL | Status: AC
  Administered 2022-08-21: 650 mg via RECTAL
  Filled 2022-08-21: qty 1

## 2022-08-21 MED ORDER — PHENYLEPHRINE 80 MCG/ML (10ML) SYRINGE FOR IV PUSH (FOR BLOOD PRESSURE SUPPORT)
PREFILLED_SYRINGE | INTRAVENOUS | Status: AC
  Filled 2022-08-21: qty 20

## 2022-08-21 MED ORDER — CINACALCET HCL 30 MG PO TABS
60.0000 mg | ORAL_TABLET | Freq: Every day | ORAL | Status: DC
  Administered 2022-08-22: 60 mg via ORAL
  Filled 2022-08-21 (×2): qty 2

## 2022-08-21 MED ORDER — ROCURONIUM BROMIDE 10 MG/ML (PF) SYRINGE
PREFILLED_SYRINGE | INTRAVENOUS | Status: DC | PRN
  Administered 2022-08-21: 70 mg via INTRAVENOUS
  Administered 2022-08-21: 30 mg via INTRAVENOUS
  Administered 2022-08-21: 50 mg via INTRAVENOUS

## 2022-08-21 MED ORDER — BUDESONIDE 0.25 MG/2ML IN SUSP
0.2500 mg | Freq: Two times a day (BID) | RESPIRATORY_TRACT | Status: DC
  Administered 2022-08-21 – 2022-08-27 (×10): 0.25 mg via RESPIRATORY_TRACT
  Filled 2022-08-21 (×12): qty 2

## 2022-08-21 MED ORDER — PHENYLEPHRINE 80 MCG/ML (10ML) SYRINGE FOR IV PUSH (FOR BLOOD PRESSURE SUPPORT)
PREFILLED_SYRINGE | INTRAVENOUS | Status: AC
  Filled 2022-08-21: qty 10

## 2022-08-21 MED ORDER — BISACODYL 10 MG RE SUPP
10.0000 mg | Freq: Every day | RECTAL | Status: DC
  Filled 2022-08-21: qty 1

## 2022-08-21 MED ORDER — FENTANYL CITRATE (PF) 250 MCG/5ML IJ SOLN
INTRAMUSCULAR | Status: AC
  Filled 2022-08-21: qty 5

## 2022-08-21 MED ORDER — CHLORHEXIDINE GLUCONATE 0.12 % MT SOLN: 15.0000 mL | OROMUCOSAL | Status: AC

## 2022-08-21 MED ORDER — SODIUM CHLORIDE 0.45 % IV SOLN: INTRAVENOUS | Status: DC | PRN

## 2022-08-21 MED ORDER — BICTEGRAVIR-EMTRICITAB-TENOFOV 50-200-25 MG PO TABS
1.0000 | ORAL_TABLET | Freq: Every day | ORAL | Status: DC
  Administered 2022-08-21 – 2022-08-27 (×7): 1 via ORAL
  Filled 2022-08-21 (×8): qty 1

## 2022-08-21 MED ORDER — SODIUM CHLORIDE (PF) 0.9 % IJ SOLN
INTRAMUSCULAR | Status: AC
  Filled 2022-08-21: qty 10

## 2022-08-21 MED ORDER — ROSUVASTATIN CALCIUM 5 MG PO TABS
10.0000 mg | ORAL_TABLET | Freq: Every day | ORAL | Status: DC
  Administered 2022-08-21 – 2022-08-27 (×7): 10 mg via ORAL
  Filled 2022-08-21 (×7): qty 2

## 2022-08-21 MED ORDER — MAGNESIUM SULFATE 4 GM/100ML IV SOLN
4.0000 g | Freq: Once | INTRAVENOUS | Status: AC
  Administered 2022-08-21: 4 g via INTRAVENOUS
  Filled 2022-08-21: qty 100

## 2022-08-21 MED ORDER — ACETAMINOPHEN 500 MG PO TABS
1000.0000 mg | ORAL_TABLET | Freq: Four times a day (QID) | ORAL | Status: AC
  Administered 2022-08-22 – 2022-08-26 (×16): 1000 mg via ORAL
  Filled 2022-08-21 (×14): qty 2

## 2022-08-21 MED ORDER — CHLORHEXIDINE GLUCONATE 0.12 % MT SOLN
15.0000 mL | Freq: Once | OROMUCOSAL | Status: AC
  Administered 2022-08-21: 15 mL via OROMUCOSAL

## 2022-08-21 MED ORDER — BUPIVACAINE LIPOSOME 1.3 % IJ SUSP
INTRAMUSCULAR | Status: AC
  Filled 2022-08-21: qty 20

## 2022-08-21 MED ORDER — LACTATED RINGERS IV SOLN: 500.0000 mL | Freq: Once | INTRAVENOUS | Status: DC | PRN

## 2022-08-21 MED ORDER — PHENYLEPHRINE HCL (PRESSORS) 10 MG/ML IV SOLN
INTRAVENOUS | Status: AC
  Filled 2022-08-21: qty 1

## 2022-08-21 MED ORDER — HEPARIN SODIUM (PORCINE) 1000 UNIT/ML IJ SOLN
INTRAMUSCULAR | Status: AC
  Filled 2022-08-21: qty 1

## 2022-08-21 MED ORDER — FAMOTIDINE 20 MG PO TABS: 20.0000 mg | ORAL_TABLET | Freq: Two times a day (BID) | ORAL | Status: DC | PRN

## 2022-08-21 MED ORDER — DEXTROSE 50 % IV SOLN: 0.0000 mL | INTRAVENOUS | Status: DC | PRN

## 2022-08-21 MED ORDER — MIDAZOLAM HCL 2 MG/2ML IJ SOLN
2.0000 mg | INTRAMUSCULAR | Status: DC | PRN
  Administered 2022-08-21 – 2022-08-22 (×3): 2 mg via INTRAVENOUS
  Filled 2022-08-21 (×3): qty 2

## 2022-08-21 MED ORDER — INSULIN REGULAR(HUMAN) IN NACL 100-0.9 UT/100ML-% IV SOLN
INTRAVENOUS | Status: DC
  Filled 2022-08-21: qty 100

## 2022-08-21 MED ORDER — SODIUM CHLORIDE 0.9% FLUSH
10.0000 mL | Freq: Two times a day (BID) | INTRAVENOUS | Status: DC
  Administered 2022-08-22 – 2022-08-26 (×11): 10 mL

## 2022-08-21 MED ORDER — ORAL CARE MOUTH RINSE
15.0000 mL | OROMUCOSAL | Status: DC
  Administered 2022-08-21 – 2022-08-22 (×6): 15 mL via OROMUCOSAL

## 2022-08-21 MED ORDER — LACTATED RINGERS IV SOLN: INTRAVENOUS | Status: DC | PRN

## 2022-08-21 MED ORDER — ETOMIDATE 2 MG/ML IV SOLN
INTRAVENOUS | Status: AC
  Administered 2022-08-21: 20 mg
  Filled 2022-08-21: qty 20

## 2022-08-21 MED ORDER — ~~LOC~~ CARDIAC SURGERY, PATIENT & FAMILY EDUCATION
Freq: Once | Status: DC
  Filled 2022-08-21: qty 1

## 2022-08-21 MED ORDER — HEPARIN SODIUM (PORCINE) 1000 UNIT/ML IJ SOLN
INTRAMUSCULAR | Status: AC
  Filled 2022-08-21: qty 10

## 2022-08-21 MED ORDER — PANTOPRAZOLE SODIUM 40 MG PO TBEC
40.0000 mg | DELAYED_RELEASE_TABLET | Freq: Every day | ORAL | Status: DC
  Administered 2022-08-21 – 2022-08-26 (×6): 40 mg via ORAL
  Filled 2022-08-21 (×6): qty 1

## 2022-08-21 MED ORDER — CEFAZOLIN SODIUM-DEXTROSE 2-4 GM/100ML-% IV SOLN
2.0000 g | Freq: Three times a day (TID) | INTRAVENOUS | Status: DC
  Administered 2022-08-21 – 2022-08-22 (×3): 2 g via INTRAVENOUS
  Filled 2022-08-21 (×3): qty 100

## 2022-08-21 MED ORDER — PROPOFOL 10 MG/ML IV BOLUS
INTRAVENOUS | Status: DC | PRN
  Administered 2022-08-21: 100 mg via INTRAVENOUS

## 2022-08-21 MED ORDER — PROTAMINE SULFATE 10 MG/ML IV SOLN
INTRAVENOUS | Status: DC | PRN
  Administered 2022-08-21: 350 mg via INTRAVENOUS

## 2022-08-21 MED ORDER — METOPROLOL TARTRATE 5 MG/5ML IV SOLN
2.5000 mg | INTRAVENOUS | Status: DC | PRN
  Administered 2022-08-24 – 2022-08-25 (×2): 2.5 mg via INTRAVENOUS
  Administered 2022-08-25 (×2): 5 mg via INTRAVENOUS
  Filled 2022-08-21 (×4): qty 5

## 2022-08-21 MED ORDER — SODIUM CHLORIDE 0.9 % IV SOLN: INTRAVENOUS | Status: DC

## 2022-08-21 MED ORDER — VANCOMYCIN HCL IN DEXTROSE 1-5 GM/200ML-% IV SOLN
1000.0000 mg | Freq: Once | INTRAVENOUS | Status: AC
  Administered 2022-08-21: 1000 mg via INTRAVENOUS
  Filled 2022-08-21: qty 200

## 2022-08-21 MED ORDER — PROPOFOL 10 MG/ML IV BOLUS
INTRAVENOUS | Status: AC
  Filled 2022-08-21: qty 20

## 2022-08-21 MED ORDER — CHLORHEXIDINE GLUCONATE 4 % EX LIQD: 30.0000 mL | CUTANEOUS | Status: DC

## 2022-08-21 MED ORDER — OLOPATADINE HCL 0.7 % OP SOLN: 1.0000 [drp] | Freq: Two times a day (BID) | OPHTHALMIC | Status: DC | PRN

## 2022-08-21 MED ORDER — METOPROLOL TARTRATE 12.5 MG HALF TABLET
ORAL_TABLET | ORAL | Status: AC
  Filled 2022-08-21: qty 1

## 2022-08-21 MED ORDER — ACETAMINOPHEN 160 MG/5ML PO SOLN: 650.0000 mg | Freq: Once | ORAL | Status: AC

## 2022-08-21 MED ORDER — FERRIC CITRATE 1 GM 210 MG(FE) PO TABS
420.0000 mg | ORAL_TABLET | Freq: Three times a day (TID) | ORAL | Status: DC
  Administered 2022-08-22 (×3): 420 mg via ORAL
  Filled 2022-08-21 (×6): qty 2

## 2022-08-21 MED ORDER — VASOPRESSIN 20 UNIT/ML IV SOLN
INTRAVENOUS | Status: AC
  Filled 2022-08-21: qty 1

## 2022-08-21 MED ORDER — ASPIRIN 325 MG PO TBEC
325.0000 mg | DELAYED_RELEASE_TABLET | Freq: Every day | ORAL | Status: DC
  Administered 2022-08-22 – 2022-08-27 (×6): 325 mg via ORAL
  Filled 2022-08-21 (×6): qty 1

## 2022-08-21 MED ORDER — ALBUMIN HUMAN 5 % IV SOLN
250.0000 mL | INTRAVENOUS | Status: AC | PRN
  Administered 2022-08-21 (×2): 12.5 g via INTRAVENOUS

## 2022-08-21 MED ORDER — TRAMADOL HCL 50 MG PO TABS: 50.0000 mg | ORAL_TABLET | ORAL | Status: DC | PRN

## 2022-08-21 MED ORDER — CHLORHEXIDINE GLUCONATE CLOTH 2 % EX PADS
6.0000 | MEDICATED_PAD | Freq: Every day | CUTANEOUS | Status: DC
  Administered 2022-08-21 – 2022-08-25 (×3): 6 via TOPICAL

## 2022-08-21 MED ORDER — MIDAZOLAM HCL (PF) 5 MG/ML IJ SOLN
INTRAMUSCULAR | Status: DC | PRN
  Administered 2022-08-21 (×3): 2 mg via INTRAVENOUS
  Administered 2022-08-21: 1 mg via INTRAVENOUS

## 2022-08-21 MED ORDER — METOPROLOL TARTRATE 25 MG/10 ML ORAL SUSPENSION
12.5000 mg | Freq: Two times a day (BID) | ORAL | Status: DC
  Filled 2022-08-21: qty 5

## 2022-08-21 MED ORDER — PANTOPRAZOLE SODIUM 40 MG PO TBEC: 40.0000 mg | DELAYED_RELEASE_TABLET | Freq: Every day | ORAL | Status: DC

## 2022-08-21 MED ORDER — 0.9 % SODIUM CHLORIDE (POUR BTL) OPTIME
TOPICAL | Status: DC | PRN
  Administered 2022-08-21: 3000 mL
  Administered 2022-08-21: 1000 mL

## 2022-08-21 MED ORDER — IOHEXOL 350 MG/ML SOLN
100.0000 mL | Freq: Once | INTRAVENOUS | Status: AC | PRN
  Administered 2022-08-21: 100 mL via INTRAVENOUS

## 2022-08-21 MED ORDER — BUPIVACAINE LIPOSOME 1.3 % IJ SUSP
INTRAMUSCULAR | Status: DC | PRN
  Administered 2022-08-21: 17 mL

## 2022-08-21 MED ORDER — ROPINIROLE HCL 1 MG PO TABS
1.0000 mg | ORAL_TABLET | Freq: Every day | ORAL | Status: DC
  Administered 2022-08-21 – 2022-08-26 (×6): 1 mg via ORAL
  Filled 2022-08-21 (×7): qty 1

## 2022-08-21 MED ORDER — PHENYLEPHRINE HCL-NACL 20-0.9 MG/250ML-% IV SOLN
0.0000 ug/min | INTRAVENOUS | Status: DC
  Administered 2022-08-21: 60 ug/min via INTRAVENOUS
  Administered 2022-08-22 (×2): 30 ug/min via INTRAVENOUS
  Filled 2022-08-21 (×3): qty 250

## 2022-08-21 MED ORDER — PROTAMINE SULFATE 10 MG/ML IV SOLN
INTRAVENOUS | Status: AC
  Filled 2022-08-21: qty 25

## 2022-08-21 MED ORDER — OXYCODONE HCL 5 MG PO TABS
5.0000 mg | ORAL_TABLET | ORAL | Status: DC | PRN
  Administered 2022-08-22: 5 mg via ORAL
  Administered 2022-08-22 – 2022-08-25 (×9): 10 mg via ORAL
  Administered 2022-08-25: 5 mg via ORAL
  Administered 2022-08-25 – 2022-08-27 (×3): 10 mg via ORAL
  Filled 2022-08-21 (×13): qty 2
  Filled 2022-08-21 (×2): qty 1

## 2022-08-21 MED ORDER — DEXMEDETOMIDINE HCL IN NACL 400 MCG/100ML IV SOLN
0.0000 ug/kg/h | INTRAVENOUS | Status: DC
  Administered 2022-08-21: 0.5 ug/kg/h via INTRAVENOUS
  Administered 2022-08-22: 0.7 ug/kg/h via INTRAVENOUS
  Filled 2022-08-21 (×2): qty 100

## 2022-08-21 MED ORDER — LUBIPROSTONE 24 MCG PO CAPS
24.0000 ug | ORAL_CAPSULE | Freq: Two times a day (BID) | ORAL | Status: DC
  Administered 2022-08-22 – 2022-08-27 (×10): 24 ug via ORAL
  Filled 2022-08-21 (×14): qty 1

## 2022-08-21 MED ORDER — LEVOTHYROXINE SODIUM 25 MCG PO TABS
25.0000 ug | ORAL_TABLET | Freq: Every day | ORAL | Status: DC
  Administered 2022-08-22 – 2022-08-27 (×6): 25 ug via ORAL
  Filled 2022-08-21 (×6): qty 1

## 2022-08-21 MED ORDER — BISACODYL 5 MG PO TBEC
10.0000 mg | DELAYED_RELEASE_TABLET | Freq: Every day | ORAL | Status: DC
  Administered 2022-08-22 – 2022-08-23 (×2): 10 mg via ORAL
  Filled 2022-08-21 (×3): qty 2

## 2022-08-21 MED ORDER — SODIUM CHLORIDE 0.9% FLUSH: 3.0000 mL | INTRAVENOUS | Status: DC | PRN

## 2022-08-21 MED ORDER — OLOPATADINE HCL 0.1 % OP SOLN: 1.0000 [drp] | Freq: Two times a day (BID) | OPHTHALMIC | Status: DC | PRN

## 2022-08-21 MED ORDER — PROTAMINE SULFATE 10 MG/ML IV SOLN
INTRAVENOUS | Status: AC
  Filled 2022-08-21: qty 10

## 2022-08-21 MED ORDER — METOPROLOL TARTRATE 12.5 MG HALF TABLET
12.5000 mg | ORAL_TABLET | Freq: Once | ORAL | Status: AC
  Administered 2022-08-21: 12.5 mg via ORAL

## 2022-08-21 MED ORDER — MORPHINE SULFATE (PF) 2 MG/ML IV SOLN
1.0000 mg | INTRAVENOUS | Status: DC | PRN
  Administered 2022-08-21 – 2022-08-22 (×3): 2 mg via INTRAVENOUS
  Administered 2022-08-22: 4 mg via INTRAVENOUS
  Administered 2022-08-22 – 2022-08-24 (×2): 2 mg via INTRAVENOUS
  Filled 2022-08-21: qty 2
  Filled 2022-08-21 (×5): qty 1

## 2022-08-21 MED ORDER — SODIUM CHLORIDE 0.9% FLUSH: 10.0000 mL | INTRAVENOUS | Status: DC | PRN

## 2022-08-21 MED ORDER — ROCURONIUM BROMIDE 10 MG/ML (PF) SYRINGE
PREFILLED_SYRINGE | INTRAVENOUS | Status: AC
  Administered 2022-08-21: 80 mg
  Filled 2022-08-21: qty 10

## 2022-08-21 MED ORDER — PHENYLEPHRINE 80 MCG/ML (10ML) SYRINGE FOR IV PUSH (FOR BLOOD PRESSURE SUPPORT)
PREFILLED_SYRINGE | INTRAVENOUS | Status: DC | PRN
  Administered 2022-08-21: 80 ug via INTRAVENOUS
  Administered 2022-08-21: 160 ug via INTRAVENOUS

## 2022-08-21 MED ORDER — ORAL CARE MOUTH RINSE: 15.0000 mL | OROMUCOSAL | Status: DC | PRN

## 2022-08-21 MED ORDER — CHLORHEXIDINE GLUCONATE CLOTH 2 % EX PADS
6.0000 | MEDICATED_PAD | Freq: Every day | CUTANEOUS | Status: DC
  Administered 2022-08-21 – 2022-08-27 (×5): 6 via TOPICAL

## 2022-08-21 MED ORDER — SODIUM CHLORIDE 0.9% IV SOLUTION: Freq: Once | INTRAVENOUS | Status: DC

## 2022-08-21 MED ORDER — LORATADINE 10 MG PO TABS
10.0000 mg | ORAL_TABLET | Freq: Every day | ORAL | Status: DC
  Administered 2022-08-22 – 2022-08-27 (×6): 10 mg via ORAL
  Filled 2022-08-21 (×6): qty 1

## 2022-08-21 MED ORDER — ACETAMINOPHEN 160 MG/5ML PO SOLN
1000.0000 mg | Freq: Four times a day (QID) | ORAL | Status: AC
  Administered 2022-08-22 (×2): 1000 mg
  Filled 2022-08-21 (×2): qty 40.6

## 2022-08-21 MED ORDER — POTASSIUM CHLORIDE 10 MEQ/50ML IV SOLN: 10.0000 meq | INTRAVENOUS | Status: AC

## 2022-08-21 MED ORDER — CHLORHEXIDINE GLUCONATE 0.12 % MT SOLN
OROMUCOSAL | Status: AC
  Filled 2022-08-21: qty 15

## 2022-08-21 MED ORDER — MIDAZOLAM HCL (PF) 10 MG/2ML IJ SOLN
INTRAMUSCULAR | Status: AC
  Filled 2022-08-21: qty 2

## 2022-08-21 MED ORDER — AZELASTINE HCL 0.1 % NA SOLN: 2.0000 | Freq: Two times a day (BID) | NASAL | Status: DC | PRN

## 2022-08-21 MED ORDER — SODIUM CHLORIDE 0.9 % IV SOLN: INTRAVENOUS | Status: DC | PRN

## 2022-08-21 MED ORDER — DOCUSATE SODIUM 100 MG PO CAPS
200.0000 mg | ORAL_CAPSULE | Freq: Every day | ORAL | Status: DC
  Administered 2022-08-22 – 2022-08-23 (×2): 200 mg via ORAL
  Filled 2022-08-21 (×4): qty 2

## 2022-08-21 MED ORDER — SODIUM CHLORIDE 0.9 % IV SOLN: 250.0000 mL | INTRAVENOUS | Status: DC

## 2022-08-21 MED ORDER — ARFORMOTEROL TARTRATE 15 MCG/2ML IN NEBU
15.0000 ug | INHALATION_SOLUTION | Freq: Two times a day (BID) | RESPIRATORY_TRACT | Status: DC
  Administered 2022-08-21 – 2022-08-27 (×10): 15 ug via RESPIRATORY_TRACT
  Filled 2022-08-21 (×12): qty 2

## 2022-08-21 MED ORDER — NOREPINEPHRINE 4 MG/250ML-% IV SOLN
0.0000 ug/min | INTRAVENOUS | Status: DC
  Administered 2022-08-22: 4 ug/min via INTRAVENOUS
  Filled 2022-08-21: qty 250

## 2022-08-21 MED ORDER — NITROGLYCERIN IN D5W 200-5 MCG/ML-% IV SOLN
0.0000 ug/min | INTRAVENOUS | Status: DC
  Administered 2022-08-21: 5 ug/min via INTRAVENOUS
  Filled 2022-08-21: qty 250

## 2022-08-21 MED ORDER — SODIUM CHLORIDE (PF) 0.9 % IJ SOLN
INTRAMUSCULAR | Status: AC
  Filled 2022-08-21: qty 20

## 2022-08-21 MED ORDER — SODIUM CHLORIDE 0.9% FLUSH
3.0000 mL | Freq: Two times a day (BID) | INTRAVENOUS | Status: DC
  Administered 2022-08-22 – 2022-08-26 (×10): 3 mL via INTRAVENOUS

## 2022-08-21 MED ORDER — FENTANYL CITRATE PF 50 MCG/ML IJ SOSY
PREFILLED_SYRINGE | INTRAMUSCULAR | Status: AC
  Administered 2022-08-21: 100 ug
  Filled 2022-08-21: qty 2

## 2022-08-21 MED ORDER — HEPARIN SODIUM (PORCINE) 1000 UNIT/ML IJ SOLN
INTRAMUSCULAR | Status: DC | PRN
  Administered 2022-08-21: 35000 [IU] via INTRAVENOUS

## 2022-08-21 MED ORDER — ONDANSETRON HCL 4 MG/2ML IJ SOLN
4.0000 mg | Freq: Four times a day (QID) | INTRAMUSCULAR | Status: DC | PRN
  Administered 2022-08-22: 4 mg via INTRAVENOUS
  Filled 2022-08-21: qty 2

## 2022-08-21 MED ORDER — GABAPENTIN 300 MG PO CAPS: 300.0000 mg | ORAL_CAPSULE | Freq: Three times a day (TID) | ORAL | Status: DC | PRN

## 2022-08-21 MED ORDER — FENTANYL CITRATE (PF) 250 MCG/5ML IJ SOLN
INTRAMUSCULAR | Status: DC | PRN
  Administered 2022-08-21: 50 ug via INTRAVENOUS
  Administered 2022-08-21: 250 ug via INTRAVENOUS
  Administered 2022-08-21 (×2): 50 ug via INTRAVENOUS
  Administered 2022-08-21: 100 ug via INTRAVENOUS

## 2022-08-21 MED ORDER — ASPIRIN 81 MG PO CHEW
324.0000 mg | CHEWABLE_TABLET | Freq: Every day | ORAL | Status: DC
  Filled 2022-08-21: qty 4

## 2022-08-21 MED ORDER — FAMOTIDINE IN NACL 20-0.9 MG/50ML-% IV SOLN
20.0000 mg | Freq: Two times a day (BID) | INTRAVENOUS | Status: AC
  Administered 2022-08-21 – 2022-08-22 (×2): 20 mg via INTRAVENOUS
  Filled 2022-08-21 (×2): qty 50

## 2022-08-21 MED ORDER — METOPROLOL TARTRATE 12.5 MG HALF TABLET
12.5000 mg | ORAL_TABLET | Freq: Two times a day (BID) | ORAL | Status: DC
  Administered 2022-08-22 – 2022-08-24 (×5): 12.5 mg via ORAL
  Filled 2022-08-21 (×5): qty 1

## 2022-08-21 MED ORDER — BUPIVACAINE HCL (PF) 0.5 % IJ SOLN
INTRAMUSCULAR | Status: AC
  Filled 2022-08-21: qty 30

## 2022-08-21 SURGICAL SUPPLY — 125 items
ADAPTER CARDIO PERF ANTE/RETRO (ADAPTER) ×1 IMPLANT
ADAPTER Y-SHAPE 20.5X 1/4 (ADAPTER) IMPLANT
ADH SKN CLS APL DERMABOND .7 (GAUZE/BANDAGES/DRESSINGS) ×2
ADPR CRDPLG .25 SLIP CNCT Y (ADAPTER) ×1
ADPR PRFSN 84XANTGRD RTRGD (ADAPTER) ×1
BAG DECANTER FOR FLEXI CONT (MISCELLANEOUS) ×1 IMPLANT
BAND ANLPLS SMRG SMLS (Prosthesis & Implant Heart) ×1 IMPLANT
BELT SUTURE MIAMI (INSTRUMENTS) IMPLANT
BLADE STERNUM SYSTEM 6 (BLADE) ×1 IMPLANT
BLADE SURG 11 STRL SS (BLADE) IMPLANT
BLADE SURG 15 STRL LF DISP TIS (BLADE) ×1 IMPLANT
BLADE SURG 15 STRL SS (BLADE) ×1
BOOT SUTURE AID YELLOW STND (SUTURE) IMPLANT
CANISTER SUCT 3000ML PPV (MISCELLANEOUS) ×1 IMPLANT
CANN PRFSN 3/8X14X24FR PCFC (MISCELLANEOUS)
CANN PRFSN 3/8XCNCT ST RT ANG (MISCELLANEOUS)
CANNULA ADULT BIO-MEDICUS 19FR (CANNULA) IMPLANT
CANNULA FEM BIOMEDICUS 25FR (CANNULA) IMPLANT
CANNULA GUNDRY RCSP 15FR (MISCELLANEOUS) ×1 IMPLANT
CANNULA PRFSN 3/8X14X24FR PCFC (MISCELLANEOUS) IMPLANT
CANNULA PRFSN 3/8XCNCT RT ANG (MISCELLANEOUS) IMPLANT
CANNULA SUMP PERICARDIAL (CANNULA) ×1 IMPLANT
CANNULA VEN MTL TIP RT (MISCELLANEOUS)
CATH COUDE FOLEY 2W 5CC 16FR (CATHETERS) IMPLANT
CATH ROBINSON RED A/P 18FR (CATHETERS) ×3 IMPLANT
CATH THORACIC 36FR (CATHETERS) ×1 IMPLANT
CATH THORACIC 36FR RT ANG (CATHETERS) ×1 IMPLANT
CELLS DAT CNTRL 66122 CELL SVR (MISCELLANEOUS) ×1 IMPLANT
CONN 1/2X1/2X1/2  BEN (MISCELLANEOUS) ×1
CONN 1/2X1/2X1/2 BEN (MISCELLANEOUS) ×1 IMPLANT
CONN 3/8X1/2 ST GISH (MISCELLANEOUS) ×2 IMPLANT
CONN ST 1/4X3/8  BEN (MISCELLANEOUS) ×1
CONN ST 1/4X3/8 BEN (MISCELLANEOUS) IMPLANT
CONN ST 3/8 X 1/2 (MISCELLANEOUS) IMPLANT
COUNTER NEEDLE 20 DBL MAG RED (NEEDLE) IMPLANT
COVER SURGICAL LIGHT HANDLE (MISCELLANEOUS) ×2 IMPLANT
DERMABOND ADVANCED .7 DNX12 (GAUZE/BANDAGES/DRESSINGS) IMPLANT
DEVICE SUT CK QUICK LOAD MINI (Prosthesis & Implant Heart) IMPLANT
DEVICE TROCAR PUNCTURE CLOSURE (ENDOMECHANICALS) IMPLANT
DRAIN CONNECTOR BLAKE 1:1 (MISCELLANEOUS) IMPLANT
DRAPE CV SPLIT W-CLR ANES SCRN (DRAPES) IMPLANT
DRAPE INCISE IOBAN 66X45 STRL (DRAPES) IMPLANT
DRAPE PERI GROIN 82X75IN TIB (DRAPES) IMPLANT
DRAPE POUCH INSTRU U-SHP 10X18 (DRAPES) IMPLANT
DRAPE WARM FLUID 44X44 (DRAPES) ×1 IMPLANT
DRESSING AQUACEL AG SP 3.5X6 (GAUZE/BANDAGES/DRESSINGS) IMPLANT
DRSG AQUACEL AG ADV 3.5X 6 (GAUZE/BANDAGES/DRESSINGS) IMPLANT
DRSG AQUACEL AG SP 3.5X6 (GAUZE/BANDAGES/DRESSINGS) ×1
DRSG COVADERM 4X14 (GAUZE/BANDAGES/DRESSINGS) ×1 IMPLANT
ELECT REM PT RETURN 9FT ADLT (ELECTROSURGICAL) ×2
ELECTRODE REM PT RTRN 9FT ADLT (ELECTROSURGICAL) ×2 IMPLANT
FELT TEFLON 1X6 (MISCELLANEOUS) ×2 IMPLANT
GAUZE 4X4 16PLY ~~LOC~~+RFID DBL (SPONGE) ×1 IMPLANT
GAUZE SPONGE 4X4 12PLY STRL (GAUZE/BANDAGES/DRESSINGS) ×2 IMPLANT
GAUZE SPONGE 4X4 12PLY STRL LF (GAUZE/BANDAGES/DRESSINGS) IMPLANT
GLOVE BIO SURGEON STRL SZ 6 (GLOVE) IMPLANT
GLOVE BIO SURGEON STRL SZ 6.5 (GLOVE) IMPLANT
GLOVE BIO SURGEON STRL SZ7 (GLOVE) IMPLANT
GLOVE BIO SURGEON STRL SZ7.5 (GLOVE) IMPLANT
GLOVE BIO SURGEON STRL SZ8 (GLOVE) IMPLANT
GLOVE BIOGEL PI IND STRL 6 (GLOVE) IMPLANT
GLOVE BIOGEL PI IND STRL 7.0 (GLOVE) IMPLANT
GLOVE SURG MICRO LTX SZ7 (GLOVE) ×2 IMPLANT
GOWN STRL REUS W/ TWL LRG LVL3 (GOWN DISPOSABLE) ×4 IMPLANT
GOWN STRL REUS W/ TWL XL LVL3 (GOWN DISPOSABLE) ×1 IMPLANT
GOWN STRL REUS W/TWL LRG LVL3 (GOWN DISPOSABLE) ×7
GOWN STRL REUS W/TWL XL LVL3 (GOWN DISPOSABLE) ×1
HEMOSTAT SURGICEL 2X14 (HEMOSTASIS) ×1 IMPLANT
INSERT CONFORM CROSS CLAMP 66M (MISCELLANEOUS) IMPLANT
INSERT CONFORM CROSS CLAMP 86M (MISCELLANEOUS) IMPLANT
INSERT FOGARTY XLG (MISCELLANEOUS) IMPLANT
INTRODUCER SET COOK 16FR (MISCELLANEOUS) IMPLANT
KIT BASIN OR (CUSTOM PROCEDURE TRAY) ×1 IMPLANT
KIT CATH CPB BARTLE (MISCELLANEOUS) ×1 IMPLANT
KIT DEVICE COR-KNOT MIS 5X31 (Prosthesis & Implant Heart) IMPLANT
KIT DRAINAGE VACCUM ASSIST (KITS) IMPLANT
KIT SUCTION CATH 14FR (SUCTIONS) ×1 IMPLANT
KIT TURNOVER KIT B (KITS) ×1 IMPLANT
LEAD PACING MYOCARDI (MISCELLANEOUS) IMPLANT
LINE VENT (MISCELLANEOUS) IMPLANT
NDL AORTIC ROOT 14G 7F (CATHETERS) IMPLANT
NEEDLE AORTIC ROOT 14G 7F (CATHETERS) ×1 IMPLANT
NS IRRIG 1000ML POUR BTL (IV SOLUTION) ×5 IMPLANT
PACK OPEN HEART (CUSTOM PROCEDURE TRAY) ×1 IMPLANT
PAD ARMBOARD 7.5X6 YLW CONV (MISCELLANEOUS) ×2 IMPLANT
PENCIL BUTTON HOLSTER BLD 10FT (ELECTRODE) IMPLANT
POSITIONER HEAD DONUT 9IN (MISCELLANEOUS) ×1 IMPLANT
PUSHER KNOT MIAMI (INSTRUMENTS) IMPLANT
RETRACTOR WND ALEXIS 18 MED (MISCELLANEOUS) IMPLANT
RING ANNULOPLASTY SIMULUS 30 (Prosthesis & Implant Heart) IMPLANT
RTRCTR WOUND ALEXIS 18CM MED (MISCELLANEOUS) ×1
SET MPS 3-ND DEL (MISCELLANEOUS) IMPLANT
SUT BONE WAX W31G (SUTURE) ×1 IMPLANT
SUT ETHIBOND 2 0 SH (SUTURE) IMPLANT
SUT ETHIBOND 2 0 SH 36X2 (SUTURE) IMPLANT
SUT ETHIBOND 2 0 V4 (SUTURE) IMPLANT
SUT ETHIBOND 2 0V4 GREEN (SUTURE) IMPLANT
SUT ETHIBOND 3 0 SH 1 (SUTURE) IMPLANT
SUT ETHIBOND NAB MH 2-0 36IN (SUTURE) IMPLANT
SUT MNCRL AB 4-0 PS2 18 (SUTURE) IMPLANT
SUT PROLENE 3 0 SH 1 (SUTURE) ×1 IMPLANT
SUT PROLENE 3 0 SH 48 (SUTURE) ×1 IMPLANT
SUT PROLENE 4 0 RB 1 (SUTURE) ×8
SUT PROLENE 4 0 SH DA (SUTURE) IMPLANT
SUT PROLENE 4-0 RB1 .5 CRCL 36 (SUTURE) ×2 IMPLANT
SUT PROLENE 5 0 C 1 36 (SUTURE) IMPLANT
SUT PROLENE 6 0 C 1 30 (SUTURE) IMPLANT
SUT SILK 1 TIES 10X30 (SUTURE) IMPLANT
SUT SILK 3 0 TIES 10X30 (SUTURE) IMPLANT
SUT SILK PERMA HAND 0-CT-1 (SUTURE) IMPLANT
SUT STEEL SZ 6 DBL 3X14 BALL (SUTURE) IMPLANT
SUT TEM PAC WIRE 2 0 SH (SUTURE) IMPLANT
SUT VIC AB 1 CTX 27 (SUTURE) ×2 IMPLANT
SUT VIC AB 2-0 CT1 27 (SUTURE) ×2
SUT VIC AB 2-0 CT1 TAPERPNT 27 (SUTURE) IMPLANT
SYR BULB IRRIG 60ML STRL (SYRINGE) IMPLANT
SYSTEM ATRIAL LIFT MIAMI (INSTRUMENTS) IMPLANT
SYSTEM SAHARA CHEST DRAIN ATS (WOUND CARE) ×1 IMPLANT
TAPE CLOTH SURG 4X10 WHT LF (GAUZE/BANDAGES/DRESSINGS) IMPLANT
TAPE PAPER 2X10 WHT MICROPORE (GAUZE/BANDAGES/DRESSINGS) IMPLANT
TOWEL GREEN STERILE (TOWEL DISPOSABLE) ×1 IMPLANT
TOWEL GREEN STERILE FF (TOWEL DISPOSABLE) ×1 IMPLANT
TRAY FOLEY SLVR 14FR TEMP STAT (SET/KITS/TRAYS/PACK) ×1 IMPLANT
TUBING ART PRESS 48 MALE/FEM (TUBING) IMPLANT
WATER STERILE IRR 1000ML POUR (IV SOLUTION) ×2 IMPLANT

## 2022-08-21 NOTE — Anesthesia Preprocedure Evaluation (Signed)
Anesthesia Evaluation  Patient identified by MRN, date of birth, ID band Patient awake    Reviewed: Allergy & Precautions, H&P , NPO status , Patient's Chart, lab work & pertinent test results  History of Anesthesia Complications (+) PONV and history of anesthetic complications  Airway Mallampati: II   Neck ROM: full    Dental   Pulmonary asthma , sleep apnea , former smoker,    breath sounds clear to auscultation       Cardiovascular hypertension, +CHF  + Valvular Problems/Murmurs MR  Rhythm:regular Rate:Normal  TEE: normal LV function. Severe MR. Dilated LA, moderate TR.   Neuro/Psych    GI/Hepatic GERD  ,  Endo/Other  Hypothyroidism   Renal/GU ESRF and DialysisRenal disease     Musculoskeletal  (+) Arthritis ,   Abdominal   Peds  Hematology  (+) HIV,   Anesthesia Other Findings   Reproductive/Obstetrics                             Anesthesia Physical Anesthesia Plan  ASA: 4  Anesthesia Plan: General   Post-op Pain Management:    Induction: Intravenous  PONV Risk Score and Plan: 3 and Ondansetron, Dexamethasone, Midazolam and Treatment may vary due to age or medical condition  Airway Management Planned: Double Lumen EBT  Additional Equipment: Arterial line, CVP, PA Cath, TEE, 3D TEE and Ultrasound Guidance Line Placement  Intra-op Plan:   Post-operative Plan: Post-operative intubation/ventilation  Informed Consent: I have reviewed the patients History and Physical, chart, labs and discussed the procedure including the risks, benefits and alternatives for the proposed anesthesia with the patient or authorized representative who has indicated his/her understanding and acceptance.     Dental advisory given  Plan Discussed with: CRNA, Anesthesiologist and Surgeon  Anesthesia Plan Comments:         Anesthesia Quick Evaluation

## 2022-08-21 NOTE — Progress Notes (Signed)
Patient ID: Albert Eaton, male   DOB: 1966-01-01, 56 y.o.   MRN: [id number]  TCTS Evening Rounds:   Hemodynamically stable  CI = 2.9 Sinus 69  He weaned and had acceptable weaning parameters and was extubated but reportedly 5-10 minutes later developed jerking movements of his extremities and then became apneic and had to be bagged and intubated. Code stroke called and sent for CT head showing no acute intracranial process. CTA showed a small area of infarct in the left frontal lobe.   ESRD CT output low  CBC    Component Value Date/Time   WBC 12.2 (H) 08/21/2022 1302   RBC 3.38 (L) 08/21/2022 1302   HGB 10.7 (L) 08/21/2022 1302   HGB 13.1 07/25/2022 1244   HCT 31.8 (L) 08/21/2022 1302   HCT 39.2 07/25/2022 1244   PLT 177 08/21/2022 1302   PLT 224 07/25/2022 1244   MCV 94.1 08/21/2022 1302   MCV 91 07/25/2022 1244   MCH 31.7 08/21/2022 1302   MCHC 33.6 08/21/2022 1302   RDW 15.6 (H) 08/21/2022 1302   RDW 17.1 (H) 07/25/2022 1244   LYMPHSABS 2.6 02/09/2022 2255   LYMPHSABS 2.2 09/20/2018 0936   MONOABS 0.7 02/09/2022 2255   EOSABS 0.2 02/09/2022 2255   EOSABS 0.1 09/20/2018 0936   BASOSABS 0.0 02/09/2022 2255   BASOSABS 0.0 09/20/2018 0936     BMET    Component Value Date/Time   NA 137 08/21/2022 1152   NA 141 07/25/2022 1244   K 3.2 (L) 08/21/2022 1152   CL 96 (L) 08/21/2022 1148   CO2 21 (L) 08/19/2022 1204   GLUCOSE 144 (H) 08/21/2022 1148   BUN 36 (H) 08/21/2022 1148   BUN 18 07/25/2022 1244   CREATININE 8.80 (H) 08/21/2022 1148   CREATININE 9.83 (H) 04/17/2022 0345   CALCIUM 8.5 (L) 08/19/2022 1204   EGFR 11 (L) 07/25/2022 1244   GFRNONAA 6 (L) 08/19/2022 1204   GFRNONAA 17 (L) 08/31/2017 0908     A/P:  His deterioration could be due to lingering effects of anesthesia in this renal failure pt leading to decompensation early after extubation. I don't think an acute infarct would be seen that quickly on CT and I doubt that it would cause this  decompensation. Will keep on vent overnight and follow closely.

## 2022-08-21 NOTE — Progress Notes (Signed)
EEG complete - results pending 

## 2022-08-21 NOTE — Procedures (Signed)
Patient Name: Albert Eaton  MRN: [id number]  Epilepsy Attending: Lora Havens  Referring Physician/Provider: Candee Furbish, MD  Date: 08/21/2022 Duration: 21.50 mins  Patient history: 55yo M had myoclonus of all 4 ext then became unresponsive and stopped breathing. EEG to evaluate for seizure  Level of alertness:  comatose  AEDs during EEG study: None  Technical aspects: This EEG study was done with scalp electrodes positioned according to the 10-20 International system of electrode placement. Electrical activity was reviewed with band pass filter of 1-70Hz , sensitivity of 7 uV/mm, display speed of 28m/sec with a 60Hz  notched filter applied as appropriate. EEG data were recorded continuously and digitally stored.  Video monitoring was available and reviewed as appropriate.  Description: EEG showed continuous generalized mixed frequencies with predominantly 3 to 6 Hz theta-delta slowing admixed with intermittent 8-9Hz  generalized alpha activity and 13-15hz  frontocentral beta activity. Hyperventilation and photic stimulation were not performed.     ABNORMALITY - Continuous slow, generalized  IMPRESSION: This study is suggestive of severe diffuse encephalopathy, nonspecific etiology but could be related to sedation. No seizures or epileptiform discharges were seen throughout the recording.  Please note that lack of epileptiform activity on interictal EEG does not exclude the diagnosis of epilepsy.   Poet Hineman OBarbra Sarks

## 2022-08-21 NOTE — Hospital Course (Addendum)
History of Present Illness:  Albert Eaton is a very pleasant 56 yo AAM with history of HIV, ESRD, Renal Cancer, GERD, Hep B, Hypothyroidism, and MR.  He has been on dialysis since 2019 secondary to HTN and he is also sp Left nephrectomy for cancer. He has had atrial arrhythmias in the past that was determined best to be treated medically. He is normally in NSR and not on anticoagulation. He has been suffering more over the past 6 months with DOE that occurs when he pushes himself and going up flights of stairs. He also more recently has had intermittent CP that occurs at rest and with exertion. He had a cath last November with moderate disease throughout. 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 He had a dilated LA and in addition had moderate TR. Pt was felt at his age to best be served with repair and was referred for consideration. Pt feels that the hemodialysis has been better at keeping his volume status better managed than his peritoneal dialysis. Pt reports that he is also considered for renal transplant. He is having his teeth evaluated early next month.  He was evaluated by Dr. Lavonna Monarch who recommended Mitral Valve Repair via Minimally Invasive approach.  The risks and benefits of the procedure were explained to the patient and he was agreeable to proceed.  Hospital Course:  Albert Eaton presented to [facility] on 08/21/2022.  He was taken to the operating room and underwent Minimally Invasive Mitral Valve Repair.  He tolerated the procedure without difficulty and was taken to the SICU in stable condition.  The patient was extubated the evening of surgery.  However post extubation the patient started exhibiting jerking movements with elevated blood pressure.  This lasted briefly, but at end of episode the patient was not breathing and was cyanotic.  He required re-intubation  performed by critical care physician.  Neurology consult was obtained and EEG was performed which showed no evidence of seizures.  The patient was weaned off Phenylephrine and Levophed as hemodynamics allowed.  He was weaned and again extubated on POD #1.  CT scan of the head was obtained and showed small left frontal lobe white matter infarct.  MRI would be required to fully evaluate;however, this was not needed because neuro status improved and returned to baseline.  He has a history of CKD and receives HD. Nephrology was consulted and CVVH was arranged post op. Chest tubes were removed on 10/28.

## 2022-08-21 NOTE — Anesthesia Procedure Notes (Signed)
Central Venous Catheter Insertion Performed by: Albertha Ghee, MD, anesthesiologist Start/End10/26/2023 7:15 AM, 08/21/2022 7:18 AM Patient location: Pre-op. Preanesthetic checklist: patient identified, IV checked, site marked, risks and benefits discussed, surgical consent, monitors and equipment checked, pre-op evaluation, timeout performed and anesthesia consent Hand hygiene performed  and maximum sterile barriers used  PA cath was placed.Swan type:thermodilution Procedure performed without using ultrasound guided technique. Attempts: 1 Patient tolerated the procedure well with no immediate complications.

## 2022-08-21 NOTE — Progress Notes (Signed)
1715  Extubated per protocol meeting all parameters and with Dr. Thompson Caul approval. Patient verbalized his name and that he was at [facility]. After about 10 minutes patient developed jerking movements of his arms and legs and then was not breathing and was bagged and reintubated. Code stroke called. CT head done. Dr. Lavonna Monarch and Dr. Cyndia Bent notified of events. EEG waiting in room on return to start stat EEG. Patient's nephew called and updated.

## 2022-08-21 NOTE — Progress Notes (Signed)
Pt achieved a NIF of -20 and VC of 1.1 with good pt effort on all attempts, pt tolerated well, RN at bedside, RT will monitor.

## 2022-08-21 NOTE — Op Note (Signed)
CARDIOVASCULAR SURGERY OPERATIVE NOTE  08/21/2022 Nezar Buckles [place] [id number]  Surgeon:  Everlena Cooper, MD  First Assistant: Oswaldo Milian MD                               An experienced assistant was required given the complexity of this surgery and the standard of surgical care. The assistant was needed for exposure, dissection, suctioning, retraction of delicate tissues and sutures, instrument exchange and for overall help during this procedure.     Preoperative Diagnosis:  Severe Mitral Regurgitation and moderate Tricuspid Regurgitation  Postoperative Diagnosis:  Same but Tricuspid regurgitation mild    Procedure:  Right Mini thoroctomy Extracorporeal circulation 3.   Mitral valve ring annuloplasty using a 30 mm Simulus semi-rigid annuloplasty band (SN [id number]  Anesthesia:  Albertha Ghee MDGeneral Endotracheal   Clinical History/Surgical Indication:  Severe MR and moderate TR Pt has NYHA class 2 symptoms from what appears to be type I annular dilation MR. He has some evidence of posterior jet that really cant see anterior leaflet prolapse. The TR also appears to be Type I. We discussed the indications for surgery and options for minimal invasive incision vs sternotomy and feel that if no need for complete maze ablation procedure, the mini thorocotomy route should be able to perform the repairs. If unable to repair, his age and renal failure would best be to utilize a mechanical valve. He understands that this will require life long anticoagulation. We reviewed all the risks and goals of surgery and he wishes to proceed;  Preparation:  The patient was seen in the preoperative holding area and the correct patient, correct operation were confirmed with the patient after reviewing the medical record and catheterization. The consent was signed by me. Preoperative antibiotics were given. A pulmonary arterial line and radial arterial line were placed by the anesthesia team. The patient was  taken back to the operating room and positioned supine on the operating room table. After being placed under general endotracheal anesthesia by the anesthesia team a foley catheter was placed. The neck, chest, abdomen, and both legs were prepped with betadine soap and solution and draped in the usual sterile manner. A surgical time-out was taken and the correct patient and operative procedure were confirmed with the nursing and anesthesia staff.   Pre-bypass TEE:   Complete TEE assessment was performed by Dr. Albertha Ghee . Findings of severe MR and only mild TR. Normal Lv function    Post-bypass TEE:  Normal LV function. Trace MR mild TR  Operation:  A small incision was created in the right groin area and taken down to the anterior surface of both the right common femoral artery and vein.  2 triangular pursestrings were placed in each with 5-0 Prolene suture.  Heparin was delivered.  The femoral vein was cannulated with a 25 French Medtronic venous cannula and advanced into the superior vena cava via TEE guidance.  Following confirmation of the wire and the aorta the femoral arterial cannulation was performed with a 19 French arterial cannula and the line was tested with good pressures and flow. Following this the right minithoracotomy was performed through the fourth intercostal space.  The soft tissue retractor was placed along with the retractor.  Cardiopulmonary bypass was instituted following confirmation of anticoagulation.  Pericardial opening was performed 2 cm anterior to the phrenic nerve and was advanced down to the diaphragm and up over the aorta.  Stay sutures were performed and brought out through one of the chest tube sites inferiorly created an additional pericardial stay sutures brought out inferior to the thoracotomy incision.  1 additional pericardial stay was placed at the superior vena cava junction.  An additional stay suture was placed in the pericardial flap.  The aorta was  then cannulated with an antegrade cardioplegia catheter with 4 oh pledgeted Prolene suture. With preparation of the aorta the aortic cross-clamp was placed and 1300 cc of [place] blood cardioplegia was administered antegrade and complete cardiac arrest was accomplished. The left atrium was opened and extended inferiorly and superiorly and the large left atrial lift retractor was placed following a 16 French tear-away sheath being placed through the anterior chest wall and excellent exposure of the mitral valve was obtained. Annular sutures were then placed around the mitral valve in anticipation of a semirigid band in the annulus and anterior leaflet was sized to a 30 mm annuloplasty ring. The annuloplasty ring was secured with the core knot system and on saline load testing the leaflets had good coaptation and was confirmed with ink marking of the leaflets.  The lift retractor was then removed and the left atrium closed over ventricular sump.  Patient was placed in a headdown position and the aortic cross-clamp was removed.  There was continuous aspiration on the antegrade cardioplegia needle. A ventricular pacing wire was placed on the inferior surface of the left ventricle and brought out through the atrial left site.  This was secured.  The patient was then de-aired with filling in with ventilation and the ventricular sump was removed. Following this with complete separation from cardiopulmonary bypass the TEE confirmed trivial to trace mitral regurgitation with a mean gradient of 3 mmHg.  Cardiopulmonary bypass was reinstituted to remove the antegrade cardioplegia needle and placement of chest tube and pericardial tube.  These were brought out through inferior stab wounds and secured.  Patient was again weaned from cardiopulmonary bypass on minimal inotropic support and protamine was delivered. The femoral vessels were decannulated and sites oversewn were necessary and this wound was closed in multiple  layers observable suture.  With adequate hemostasis the ribs were reapproximated with #1 Vicryl suture x2 and the wound was closed in multiple layers observable suture.  Sterile dressings were applied.  The crossclamp was removed with a time of 82 minutes. CPB time was 130 minutes.

## 2022-08-21 NOTE — Code Documentation (Signed)
Stroke Response Nurse Documentation Code Documentation  Albert Eaton is a 56 y.o. male who presented this morning for Mitral Valve Replacement surgery. The patient was appropriately rapid weaned for extubation at 1715. At this time he was able to answer 2 orientation questions then at 1730 he was suddenly apneic and required reintubation. Code stroke was called due to the sudden neurologic change. Stroke team at the bedside. NIHSS unable to be completed due to patient having RSI medications.  The following imaging was completed:  CT Head and CTA. Patient is not a candidate for IV Thrombolytic due to surgery this morning. Patient is not a candidate for IR due to no suspected LVO.   Care Plan: Q2 NIHSS/VS. Discussed with 2H RN Thayer Headings- if patient begins to wake up will call Rapid Response to assist with baseline NIHSS.    Meda Klinefelter  Stroke Response RN

## 2022-08-21 NOTE — Progress Notes (Signed)
Reached out to RN to see if patient was available and reached no response yet. Pediatric STAT just came in. EEG Tech will hook-up later.

## 2022-08-21 NOTE — Progress Notes (Signed)
Pt was extubated per rapid wean protocol,  positive cuff leak noted pt was able speak post extubation and able to state his name and location. Within 5 min pt then began to have jerking movements with increased BP, with the movements lasting about 1 min. Once jerking had stopped pt was noted to have stopped breathing and became cyanotic and decreased stats. RT x2 present at bedside began bagging with jaw thrust  maneuver with improvement in sats to 96%, CCM MD Called and came to bedside. Pt was intubated by CCM MD without any complications. Once pt was intubated pt was transported to CT and back to 5H83 with out complications. Will obtain post intubation ABG and continue to monitor.

## 2022-08-21 NOTE — Progress Notes (Signed)
Patient is in CT. Tech is waiting in room for the pt to return.

## 2022-08-21 NOTE — Progress Notes (Signed)
  Echocardiogram Echocardiogram Transesophageal has been performed.  Johny Chess 08/21/2022, 9:06 AM

## 2022-08-21 NOTE — Transfer of Care (Signed)
Immediate Anesthesia Transfer of Care Note  Patient: Albert Eaton  Procedure(s) Performed: MINIMALLY INVASIVE MITRAL VALVE REPAIR USING RING ANNULOPLASTY SIMULUS 74m TRANSESOPHAGEAL ECHOCARDIOGRAM (TEE)  Patient Location: ICU  Anesthesia Type:General  Level of Consciousness: Patient remains intubated per anesthesia plan  Airway & Oxygen Therapy: Patient remains intubated per anesthesia plan and Patient placed on Ventilator (see vital sign flow sheet for setting)  Post-op Assessment: Report given to RN and Post -op Vital signs reviewed and stable  Post vital signs: Reviewed and stable  Last Vitals:  Vitals Value Taken Time  BP    Temp 36.3 C 08/21/22 1257  Pulse 75 08/21/22 1257  Resp 18 08/21/22 1257  SpO2 99 % 08/21/22 1257  Vitals shown include unvalidated device data.  Last Pain:  Vitals:   08/21/22 0621  TempSrc:   PainSc: 0-No pain      Patients Stated Pain Goal: 0 [id number]  Complications: No notable events documented.

## 2022-08-21 NOTE — Anesthesia Procedure Notes (Signed)
Procedure Name: Intubation Date/Time: 08/21/2022 7:54 AM  Performed by: Lorie Phenix, CRNAPre-anesthesia Checklist: Patient identified, Emergency Drugs available, Suction available and Patient being monitored Patient Re-evaluated:Patient Re-evaluated prior to induction Oxygen Delivery Method: Circle system utilized Preoxygenation: Pre-oxygenation with 100% oxygen Induction Type: IV induction Ventilation: Mask ventilation without difficulty and Oral airway inserted - appropriate to patient size Laryngoscope Size: Mac and 4 Grade View: Grade II Tube type: Oral Tube size: 8.0 mm Number of attempts: 1 Airway Equipment and Method: Stylet Placement Confirmation: ETT inserted through vocal cords under direct vision, positive ETCO2 and breath sounds checked- equal and bilateral Secured at: 24 cm Tube secured with: Tape Dental Injury: Teeth and Oropharynx as per pre-operative assessment

## 2022-08-21 NOTE — Consult Note (Signed)
NAME:  Albert Eaton, MRN:  [id number], DOB:  10-21-1966, LOS: 0 ADMISSION DATE:  08/21/2022, CONSULTATION DATE:  08/21/22 REFERRING MD:  Lavonna Monarch, CHIEF COMPLAINT:  SOB   History of Present Illness:  56 year old man with hx of ESRD on HD, prior RCC s/p nephrectomy, HIV, asthma, sleep apnea who presents with symptomatic mitral insufficiency and underwent mini thoracotomy today with mitral valve ring annuloplasty with bypass support (82 min crossclamp, 130 min total).  He arrives back to the unit on pressors, vent and PCCM to assist with management.  Pertinent  Medical History   Past Medical History:  Diagnosis Date   Anemia    Aortic atherosclerosis (HCC)    Arthritis    Asthma    Cancer ([place])    renal cyst   Chronic diastolic CHF (congestive heart failure) ([place]) 01/06/2017   Echo 7/16 [facility] in [place], [name]) Mild AI, mild LAE, mild concentric LVH, EF 55, normal wall motion, mild to moderate MR, mild PI, RVSP 55 // Echo 10/09/16 ([facility]):  Moderate LVH, grade 2 diastolic dysfunction, mild MR, moderate LAE    CKD (chronic kidney disease)    Dialysis Mon Wed Fri   Esophagitis    GERD (gastroesophageal reflux disease)    Gout    no current problems   Heart murmur    never caused any problems   Hepatitis    Hep B   History of nuclear stress test    a. Nuc study 7/16: no scar or ischemia, EF 42 // b. Nuc study 12/17: EF 48, ?small apical ischemia, Low Risk   HIV (human immunodeficiency virus infection) ([place])    HLD (hyperlipidemia)    Hypertension    Hypertensive heart disease with CHF (congestive heart failure) ([place]) 10/09/2016   Hypothyroidism    Mitral regurgitation    Neuropathy    OSA (obstructive sleep apnea)    Post-operative nausea and vomiting    1 time as a child, no problems as an adult   Sleep apnea    uses c-pap   Thyroid disease    Wears glasses    Wears partial dentures      Significant Hospital Events: Including procedures, antibiotic start and  stop dates in addition to other pertinent events   08/21/22 Right Mini thoroctomy Extracorporeal circulation 3.   Mitral valve ring annuloplasty using a 30 mm Simulus semi-rigid annuloplasty band (SN [id number]  Interim History / Subjective:  Consulted.  Objective   Blood pressure (!) 133/92, pulse 80, temperature 97.7 F (36.5 C), resp. rate (!) 27, height 5' 10"  (1.778 m), weight 93.4 kg, SpO2 100 %. PAP: (21-60)/(13-34) 44/20 CO:  [5.1 L/min] 5.1 L/min CI:  [2.4 L/min/m2] 2.4 L/min/m2  Vent Mode: PRVC;SIMV;PSV FiO2 (%):  [50 %] 50 % Set Rate:  [12 bmp] 12 bmp Vt Set:  [580 mL] 580 mL PEEP:  [5 cmH20] 5 cmH20   Intake/Output Summary (Last 24 hours) at 08/21/2022 1533 Last data filed at 08/21/2022 1500 Gross per 24 hour  Intake 2797.41 ml  Output 170 ml  Net 2627.41 ml   Filed Weights   08/21/22 0609  Weight: 93.4 kg    Examination: General: sedated and no distress HENT: mild lip swellingnoted, ETT in place minimal secretions Lungs: Clear, no wheezing, starting to trigger vent Cardiovascular: RRR, ext warm Abdomen: soft, hypoactive BS Extremities: no edema Neuro: starting to move Skin: groin ECMO sites look okay, R chest wall incisions look good, 2 drains coming out with  mild-moderate mostly serous output  Resolved Hospital Problem list   N/A  Assessment & Plan:  S/P mitral valve annuloplasty with CPB and mini thoracotomy Postoperative ventilator management Hx ESRD with intermittent issues with vasoplegia HIV- CD4 694 04/17/22, undetectable load, on ART HTN, HLD, Hypothyroidism, OSA, Asthma  - Drain, pressor management per primary - Rapid wean vent pathway - Insulin gtt for now - Brovana/pulmicort in lieu of breo - iHD per nephrology - Continue ART, synthroid  Best Practice (right click and "Reselect all SmartList Selections" daily)   Diet/type: NPO DVT prophylaxis: not indicated GI prophylaxis: PPI Lines: Central line Foley:  Yes, and it is still  needed Code Status:  full code Last date of multidisciplinary goals of care discussion [per primary]  Labs   CBC: Recent Labs  Lab 08/19/22 1204 08/21/22 0610 08/21/22 1043 08/21/22 1054 08/21/22 1118 08/21/22 1148 08/21/22 1152 08/21/22 1302  WBC 6.9  --   --   --   --   --   --  12.2*  HGB 12.5*   < > 8.4* 8.5* 8.8* 9.9* 10.2* 10.7*  HCT 37.2*   < > 24.9* 25.0* 26.0* 29.0* 30.0* 31.8*  MCV 93.7  --   --   --   --   --   --  94.1  PLT 303  --  208  --   --   --   --  177   < > = values in this interval not displayed.    Basic Metabolic Panel: Recent Labs  Lab 08/19/22 1204 08/21/22 0610 08/21/22 0807 08/21/22 0811 08/21/22 0904 08/21/22 0923 08/21/22 0955 08/21/22 1022 08/21/22 1054 08/21/22 1118 08/21/22 1148 08/21/22 1152  NA 134*   < > 137   < > 137   < > 137 136 135 134* 137 137  K 3.7   < > 3.1*   < > 3.2*   < > 4.3 3.6 4.0 3.9 3.2* 3.2*  CL 95*   < > 96*  --  97*  --  98  --  96*  --  96*  --   CO2 21*  --   --   --   --   --   --   --   --   --   --   --   GLUCOSE 74   < > 116*  --  89  --  136*  --  137*  --  144*  --   BUN 38*   < > 36*  --  37*  --  35*  --  36*  --  36*  --   CREATININE 9.65*   < > 9.00*  --  9.30*  --  9.00*  --  8.50*  --  8.80*  --   CALCIUM 8.5*  --   --   --   --   --   --   --   --   --   --   --    < > = values in this interval not displayed.   GFR: Estimated Creatinine Clearance: 10.8 mL/min (A) (by C-G formula based on SCr of 8.8 mg/dL (H)). Recent Labs  Lab 08/19/22 1204 08/21/22 1302  WBC 6.9 12.2*    Liver Function Tests: Recent Labs  Lab 08/19/22 1204  AST 15  ALT 17  ALKPHOS 144*  BILITOT 0.5  PROT 8.6*  ALBUMIN 4.5   No results for input(s): "LIPASE", "AMYLASE" in the last 168 hours.  No results for input(s): "AMMONIA" in the last 168 hours.  ABG    Component Value Date/Time   PHART 7.284 (L) 08/21/2022 1152   PCO2ART 50.5 (H) 08/21/2022 1152   PO2ART 94 08/21/2022 1152   HCO3 24.0 08/21/2022 1152    TCO2 25 08/21/2022 1152   ACIDBASEDEF 3.0 (H) 08/21/2022 1152   O2SAT 96 08/21/2022 1152     Coagulation Profile: Recent Labs  Lab 08/19/22 1204 08/21/22 1302  INR 1.2 1.5*    Cardiac Enzymes: No results for input(s): "CKTOTAL", "CKMB", "CKMBINDEX", "TROPONINI" in the last 168 hours.  HbA1C: HbA1c, POC (prediabetic range)  Date/Time Value Ref Range Status  05/01/2020 10:23 AM 5.3 (A) 5.7 - 6.4 % Final   HbA1c, POC (controlled diabetic range)  Date/Time Value Ref Range Status  05/01/2020 10:23 AM 5.3 0.0 - 7.0 % Final   HbA1c POC (<> result, manual entry)  Date/Time Value Ref Range Status  05/01/2020 10:23 AM 5.3 4.0 - 5.6 % Final   Hgb A1c MFr Bld  Date/Time Value Ref Range Status  08/19/2022 11:20 AM 5.1 4.8 - 5.6 % Final    Comment:    (NOTE) Pre diabetes:          5.7%-6.4%  Diabetes:              >6.4%  Glycemic control for   <7.0% adults with diabetes   12/03/2021 03:23 AM 5.2 4.8 - 5.6 % Final    Comment:    (NOTE) Pre diabetes:          5.7%-6.4%  Diabetes:              >6.4%  Glycemic control for   <7.0% adults with diabetes     CBG: Recent Labs  Lab 08/21/22 1256 08/21/22 1356 08/21/22 1512  GLUCAP 106* 108* 119*    Review of Systems:   Intubated/sedated  Past Medical History:  He,  has a past medical history of Anemia, Aortic atherosclerosis ([place]), Arthritis, Asthma, Cancer ([place]), Chronic diastolic CHF (congestive heart failure) ([place]) (01/06/2017), CKD (chronic kidney disease), Esophagitis, GERD (gastroesophageal reflux disease), Gout, Heart murmur, Hepatitis, History of nuclear stress test, HIV (human immunodeficiency virus infection) ([place]), HLD (hyperlipidemia), Hypertension, Hypertensive heart disease with CHF (congestive heart failure) ([place]) (10/09/2016), Hypothyroidism, Mitral regurgitation, Neuropathy, OSA (obstructive sleep apnea), Post-operative nausea and vomiting, Sleep apnea, Thyroid disease, Wears glasses, and Wears partial  dentures.   Surgical History:   Past Surgical History:  Procedure Laterality Date   AV FISTULA PLACEMENT Left 07/02/2018   Procedure: Creation of Left arm BRACHIOBASILIC ARTERIOVENOUS  FISTULA;  Surgeon: Marty Heck, MD;  Location: [place];  Service: Vascular;  Laterality: Left;   [place] Left 08/27/2018   Procedure: Left arm BRACHIOBASILIC VEIN TRANSPOSITION SECOND STAGE;  Surgeon: Marty Heck, MD;  Location: [place];  Service: Vascular;  Laterality: Left;   BUBBLE STUDY  09/03/2020   Procedure: BUBBLE STUDY;  Surgeon: Fay Records, MD;  Location: [place];  Service: Cardiovascular;;   BUBBLE STUDY  06/03/2022   Procedure: BUBBLE STUDY;  Surgeon: Elouise Munroe, MD;  Location: [place];  Service: Cardiology;;   CAPD INSERTION N/A 05/30/2021   Procedure: LAPAROSCOPIC INSERTION CONTINUOUS AMBULATORY PERITONEAL DIALYSIS  (CAPD) CATHETER;  Surgeon: [name], MD;  Location: [place];  Service: Vascular;  Laterality: N/A;   CAPD REMOVAL N/A 02/13/2022   Procedure: PERITONEAL DIALYSIS CATHETER REMOVAL;  Surgeon: [name], MD;  Location: [place];  Service:  Vascular;  Laterality: N/A;   CHOLECYSTECTOMY     COLONOSCOPY W/ BIOPSIES AND POLYPECTOMY     GIVENS CAPSULE STUDY N/A 03/01/2018   Procedure: GIVENS CAPSULE STUDY;  Surgeon: Jerene Bears, MD;  Location: [place];  Service: Gastroenterology;  Laterality: N/A;   HERNIA REPAIR     As baby   MULTIPLE TOOTH EXTRACTIONS     NEPHRECTOMY Left 02/18/2021   NOSE SURGERY     RENAL BIOPSY     RIGHT/LEFT HEART CATH AND CORONARY ANGIOGRAPHY N/A 09/04/2021   Procedure: RIGHT/LEFT HEART CATH AND CORONARY ANGIOGRAPHY;  Surgeon: Burnell Blanks, MD;  Location: [facility] [facility];  Service: Cardiovascular;  Laterality: N/A;   RIGHT/LEFT HEART CATH AND CORONARY ANGIOGRAPHY N/A 08/01/2022   Procedure: RIGHT/LEFT HEART CATH AND CORONARY ANGIOGRAPHY;  Surgeon: [name], MD;  Location: [place] [facility];  Service: Cardiovascular;  Laterality: N/A;   TEE WITHOUT CARDIOVERSION N/A 09/03/2020   Procedure: TRANSESOPHAGEAL ECHOCARDIOGRAM (TEE);  Surgeon: Fay Records, MD;  Location: [place];  Service: Cardiovascular;  Laterality: N/A;   TEE WITHOUT CARDIOVERSION N/A 06/03/2022   Procedure: TRANSESOPHAGEAL ECHOCARDIOGRAM (TEE);  Surgeon: Elouise Munroe, MD;  Location: [place];  Service: Cardiology;  Laterality: N/A;   UPPER GI ENDOSCOPY  04/05/2021     Social History:   reports that he quit smoking about 23 years ago. His smoking use included cigarettes. He has a 18.00 pack-year smoking history. He has never used smokeless tobacco. He reports that he does not currently use alcohol. He reports that he does not use drugs.   Family History:  His family history includes Alcohol abuse in his father; Heart attack (age of onset: 38) in his maternal grandmother; Heart failure in his mother; Hypertension in his brother, father, mother, sister, and sister; Multiple sclerosis in his sister; Scoliosis in an other family member. There is no history of Allergic rhinitis, Angioedema, Asthma, Eczema, Immunodeficiency, Urticaria, Colon cancer, Pancreatic cancer, or Esophageal cancer.   Allergies Allergies  Allergen Reactions   Ace Inhibitors Cough and Other (See Comments)     Home Medications  Prior to Admission medications   Medication Sig Start Date End Date Taking? Authorizing Provider  ACETAMINOPHEN EXTRA STRENGTH 500 MG tablet Take 1,000 mg by mouth every 6 (six) hours as needed (pain.). 02/20/21  Yes [provider]  allopurinol (ZYLOPRIM) 100 MG tablet TAKE 1 TABLET(100 MG) BY MOUTH TWICE DAILY FOR 60 DOSES 10/03/21  Yes [name], FNP  amLODipine (NORVASC) 10 MG tablet Take 1 tablet (10 mg total) by mouth daily. Patient taking differently: Take 10 mg by mouth daily after lunch. 10/31/16  Yes [name], FNP  aspirin EC 81 MG tablet Take 1 tablet (81 mg  total) by mouth daily. Swallow whole. 03/06/21  Yes Weaver, Scott T, PA-C  azelastine (ASTELIN) 0.1 % nasal spray Place 2 sprays into both nostrils 2 (two) times daily as needed for allergies. 05/13/22  Yes Valentina Shaggy, MD  B Complex-C-Folic Acid (DIALYVITE PO) Take 1 tablet by mouth daily.   Yes [provider]  BIKTARVY 50-200-25 MG TABS tablet TAKE ONE TABLET BY MOUTH EVERY DAY 06/26/22  Yes Campbell Riches, MD  budesonide-formoterol [facility]) 160-4.5 MCG/ACT inhaler Inhale 2 puffs into the lungs daily. 08/06/22  Yes Valentina Shaggy, MD  desloratadine (CLARINEX) 5 MG tablet Take 1 tablet (5 mg total) by mouth daily. Patient taking differently: Take 5 mg by mouth every evening. 05/13/22 07/28/24 Yes Gallagher,  Gwenith Daily, MD  famotidine (PEPCID) 20 MG tablet Take 1 tablet (20 mg total) by mouth 2 (two) times daily. Patient taking differently: Take 20 mg by mouth 2 (two) times daily as needed for indigestion or heartburn. 02/12/22  Yes [name], MD  ferric citrate (AURYXIA) 1 GM 210 MG(Fe) tablet Take 420 mg by mouth with breakfast, with lunch, and with evening meal.   Yes [provider]  gabapentin (NEURONTIN) 300 MG capsule TAKE 1 CAPSULE(300 MG) BY MOUTH THREE TIMES DAILY Patient taking differently: Take 300 mg by mouth 3 (three) times daily as needed (pain/neuropathy). 10/09/20  Yes [name], FNP  hydrALAZINE (APRESOLINE) 100 MG tablet Take 100 mg by mouth 3 (three) times daily.   Yes [provider]  ipratropium (ATROVENT) 0.06 % nasal spray USE 2 SPRAYS IN EACH NOSTRIL 2 TO 3 TIMES DAILY AS NEEDED 05/13/22  Yes Valentina Shaggy, MD  lactulose [facility]) 10 GM/15ML solution Take 20 g by mouth daily as needed for moderate constipation.  07/08/20  Yes [provider]  levalbuterol (XOPENEX HFA) 45 MCG/ACT inhaler Inhale 2 puffs into the lungs every 4 (four) hours as needed for wheezing. 06/05/22  Yes Valentina Shaggy, MD   levothyroxine (SYNTHROID) 25 MCG tablet Take 25 mcg by mouth daily before breakfast. 03/31/22  Yes [provider]  lubiprostone (AMITIZA) 24 MCG capsule Take 1 capsule (24 mcg total) by mouth 2 (two) times daily with a meal. NEEDS OFFICE VISIT FOR FURTHER REFILLS 03/03/22  Yes Pyrtle, Lajuan Lines, MD  Methoxy PEG-Epoetin Beta (MIRCERA IJ) Inject 1 each into the skin every 30 (thirty) days. Administered at dialysis center once a month 11/08/20  Yes [provider]  metoprolol tartrate (LOPRESSOR) 25 MG tablet Take 12.5 mg by mouth 2 (two) times daily.   Yes [provider]  Olopatadine HCl (PATADAY) 0.7 % SOLN Place 1 drop into both eyes 2 (two) times daily as needed (allergies).   Yes [provider]  pantoprazole (PROTONIX) 40 MG tablet Take 1 tablet (40 mg total) by mouth daily. **PLEASE CONTACT THE OFFICE TO SCHEDULE FOLLOW UP Patient taking differently: Take 40 mg by mouth at bedtime. 03/27/22  Yes Esterwood, Amy S, PA-C  rOPINIRole (REQUIP) 1 MG tablet TAKE ONE TABLET BY MOUTH AT BEDTIME 01/15/22  Yes Olalere, Adewale A, MD  rosuvastatin (CRESTOR) 10 MG tablet TAKE ONE TABLET BY MOUTH DAILY 12/13/21  Yes [name], MD  SENSIPAR 60 MG tablet Take 60 mg by mouth daily with supper. 06/25/22  Yes [provider]  Spacer/Aero-Holding Chambers DEVI 1 Device by Does not apply route 2 (two) times a day. 03/01/19  Yes Bobbitt, Sedalia Muta, MD  Vitamin D, Ergocalciferol, (DRISDOL) 1.25 MG (50000 UNIT) CAPS capsule Take 50,000 Units by mouth every Tuesday.   Yes [provider]     Critical care time: N/A

## 2022-08-21 NOTE — Consult Note (Signed)
Neurology Consultation  Reason for Consult: Code Stroke Referring Physician: Coralie Common  MD  CC: Code Stroke     HPI: Albert Eaton is a 56 y.o. male admitted  for elective Mitral Valve ring annuloplasty. Patient tolerated the procedure w/o any complications and transferred to [facility] Patient meet all parameters this afternoon for rapid wean and extubated. Per RN he was following commands until 5:45 pm he was reported to have myoclonic movements of all extremities. He then became unresponsive and stopped breathing. He was bagged @ the bedside and immediately paralyzed and re intubated for concern of upper airway swelling  Rocuronium was used which will affect neurological exam.   There was concern for stroke vs seizure and Code Stroke was activated  Patient was taken directly to CT Scan Suite   LKW: 07:00 (last time he was at baseline was prior to surgery) IV thrombolysis given?: no s/p MV Annuloplasty and outside window Premorbid modified Rankin scale (mRS): 0 0-Completely asymptomatic and back to baseline post-stroke 1-No significant post stroke disability and can perform usual duties with stroke symptoms 2-Slight disability-UNABLE to perform all activities but does not need assistance  3-Moderate disability-requires help but walks WITHOUT assistance 4-Needs assistance to walk and tend to bodily needs 5-Severe disability-bedridden, incontinent, needs constant attention 6- Death     ROS: Unable to obtain due to altered mental status.   Past Medical History:  Diagnosis Date   Anemia    Aortic atherosclerosis (HCC)    Arthritis    Asthma    Cancer ([place])    renal cyst   Chronic diastolic CHF (congestive heart failure) ([place]) 01/06/2017   Echo 7/16 [facility] in [place], [name]) Mild AI, mild LAE, mild concentric LVH, EF 55, normal wall motion, mild to moderate MR, mild PI, RVSP 55 // Echo 10/09/16 ([facility]):  Moderate LVH, grade 2 diastolic dysfunction, mild MR, moderate LAE     CKD (chronic kidney disease)    Dialysis Mon Wed Fri   Esophagitis    GERD (gastroesophageal reflux disease)    Gout    no current problems   Heart murmur    never caused any problems   Hepatitis    Hep B   History of nuclear stress test    a. Nuc study 7/16: no scar or ischemia, EF 42 // b. Nuc study 12/17: EF 48, ?small apical ischemia, Low Risk   HIV (human immunodeficiency virus infection) ([place])    HLD (hyperlipidemia)    Hypertension    Hypertensive heart disease with CHF (congestive heart failure) ([place]) 10/09/2016   Hypothyroidism    Mitral regurgitation    Neuropathy    OSA (obstructive sleep apnea)    Post-operative nausea and vomiting    1 time as a child, no problems as an adult   Sleep apnea    uses c-pap   Thyroid disease    Wears glasses    Wears partial dentures      Family History  Problem Relation Age of Onset   Hypertension Mother    Heart failure Mother    Hypertension Father    Alcohol abuse Father    Hypertension Sister    Multiple sclerosis Sister    Hypertension Sister    Hypertension Brother    Heart attack Maternal Grandmother 45   Scoliosis Other    Allergic rhinitis Neg Hx    Angioedema Neg Hx    Asthma Neg Hx    Eczema Neg Hx  Immunodeficiency Neg Hx    Urticaria Neg Hx    Colon cancer Neg Hx    Pancreatic cancer Neg Hx    Esophageal cancer Neg Hx      Social History:   reports that he quit smoking about 23 years ago. His smoking use included cigarettes. He has a 18.00 pack-year smoking history. He has never used smokeless tobacco. He reports that he does not currently use alcohol. He reports that he does not use drugs.  Medications  Current Facility-Administered Medications:    0.45 % sodium chloride infusion, , Intravenous, Continuous PRN, Barrett, Erin R, PA-C, Last Rate: 20 mL/hr at 08/21/22 2000, Infusion Verify at 08/21/22 2000   [START ON 08/22/2022] 0.9 %  sodium chloride infusion, 250 mL, Intravenous, Continuous,  Barrett, Erin R, PA-C   0.9 %  sodium chloride infusion, , Intravenous, Continuous, Barrett, Erin R, PA-C   [START ON 08/22/2022] acetaminophen (TYLENOL) tablet 1,000 mg, 1,000 mg, Oral, Q6H **OR** [START ON 08/22/2022] acetaminophen (TYLENOL) 160 MG/5ML solution 1,000 mg, 1,000 mg, Per Tube, Q6H, Barrett, Erin R, PA-C   albumin human 5 % solution 12.5 g, 250 mL, Intravenous, Q15 min PRN, Barrett, Erin R, PA-C, Last Rate: 60 mL/hr at 08/21/22 2000, Infusion Verify at 08/21/22 2000   arformoterol (BROVANA) nebulizer solution 15 mcg, 15 mcg, Nebulization, BID, Candee Furbish, MD, 15 mcg at 08/21/22 1940   [START ON 08/22/2022] aspirin EC tablet 325 mg, 325 mg, Oral, Daily **OR** [START ON 08/22/2022] aspirin chewable tablet 324 mg, 324 mg, Per Tube, Daily, Barrett, Erin R, PA-C   azelastine (ASTELIN) 0.1 % nasal spray 2 spray, 2 spray, Each Nare, BID PRN, Barrett, Erin R, PA-C   bictegravir-emtricitabine-tenofovir AF (BIKTARVY) 50-200-25 MG per tablet 1 tablet, 1 tablet, Oral, Daily, Barrett, Erin R, PA-C   [START ON 08/22/2022] bisacodyl (DULCOLAX) EC tablet 10 mg, 10 mg, Oral, Daily **OR** [START ON 08/22/2022] bisacodyl (DULCOLAX) suppository 10 mg, 10 mg, Rectal, Daily, Barrett, Erin R, PA-C   budesonide (PULMICORT) nebulizer solution 0.25 mg, 0.25 mg, Nebulization, BID, Candee Furbish, MD, 0.25 mg at 08/21/22 1940   ceFAZolin (ANCEF) IVPB 2g/100 mL premix, 2 g, Intravenous, Q8H, Barrett, Erin R, PA-C, Stopped at 08/21/22 1430   chlorhexidine (PERIDEX) 0.12 % solution 15 mL, 15 mL, Mouth/Throat, NOW, Barrett, Erin R, PA-C   Chlorhexidine Gluconate Cloth 2 % PADS 6 each, 6 each, Topical, Daily, Coralie Common, MD, 6 each at 08/21/22 1456   Chlorhexidine Gluconate Cloth 2 % PADS 6 each, 6 each, Topical, Daily, Coralie Common, MD, 6 each at 08/21/22 1600   cinacalcet (SENSIPAR) tablet 60 mg, 60 mg, Oral, Q supper, Barrett, Erin R, PA-C   dexmedetomidine (PRECEDEX) 400 MCG/100ML (4 mcg/mL) infusion,  0-0.7 mcg/kg/hr, Intravenous, Titrated, Coralie Common, MD, Last Rate: 11.63 mL/hr at 08/21/22 2000, 0.5 mcg/kg/hr at 08/21/22 2000   dextrose 50 % solution 0-50 mL, 0-50 mL, Intravenous, PRN, Barrett, Erin R, PA-C   [START ON 08/22/2022] docusate sodium (COLACE) capsule 200 mg, 200 mg, Oral, Daily, Barrett, Erin R, PA-C   famotidine (PEPCID) IVPB 20 mg premix, 20 mg, Intravenous, Q12H, Barrett, Erin R, PA-C, Paused at 08/21/22 1344   famotidine (PEPCID) tablet 20 mg, 20 mg, Oral, BID PRN, Barrett, Erin R, PA-C   ferric citrate (AURYXIA) tablet 420 mg, 420 mg, Oral, TID with meals, Barrett, Erin R, PA-C   gabapentin (NEURONTIN) capsule 300 mg, 300 mg, Oral, TID PRN, Barrett, Erin R, PA-C   insulin regular, human (MYXREDLIN) 100  units/ 100 mL infusion, , Intravenous, Continuous, Barrett, Erin R, PA-C, Last Rate: 0.7 mL/hr at 08/21/22 2000, 0.7 Units/hr at 08/21/22 2000   lactated ringers infusion 500 mL, 500 mL, Intravenous, Once PRN, Barrett, Erin R, PA-C   lactated ringers infusion, , Intravenous, Continuous, Barrett, Erin R, PA-C   lactated ringers infusion, , Intravenous, Continuous, Barrett, Erin R, PA-C, Last Rate: 20 mL/hr at 08/21/22 2000, Infusion Verify at 08/21/22 2000   [START ON 08/22/2022] levothyroxine (SYNTHROID) tablet 25 mcg, 25 mcg, Oral, QAC breakfast, Barrett, Erin R, PA-C   loratadine (CLARITIN) tablet 10 mg, 10 mg, Oral, Daily, Barrett, Erin R, PA-C   lubiprostone (AMITIZA) capsule 24 mcg, 24 mcg, Oral, BID WC, Barrett, Erin R, PA-C   metoprolol tartrate (LOPRESSOR) tablet 12.5 mg, 12.5 mg, Oral, BID **OR** metoprolol tartrate (LOPRESSOR) 25 mg/10 mL oral suspension 12.5 mg, 12.5 mg, Per Tube, BID, Barrett, Erin R, PA-C   metoprolol tartrate (LOPRESSOR) injection 2.5-5 mg, 2.5-5 mg, Intravenous, Q2H PRN, Barrett, Erin R, PA-C   midazolam (VERSED) injection 2 mg, 2 mg, Intravenous, Q1H PRN, Barrett, Erin R, PA-C, 2 mg at 08/21/22 1728   morphine (PF) 2 MG/ML injection 1-4 mg, 1-4  mg, Intravenous, Q1H PRN, Barrett, Erin R, PA-C, 2 mg at 08/21/22 1404   nitroGLYCERIN 50 mg in dextrose 5 % 250 mL (0.2 mg/mL) infusion, 0-100 mcg/min, Intravenous, Titrated, Barrett, Erin R, PA-C, Stopped at 08/21/22 1546   norepinephrine (LEVOPHED) 42m in 2541m(0.016 mg/mL) premix infusion, 0-40 mcg/min, Intravenous, Titrated, WeCoralie CommonMD, Last Rate: 15 mL/hr at 08/21/22 2000, 4 mcg/min at 08/21/22 2000   olopatadine (PATANOL) 0.1 % ophthalmic solution 1 drop, 1 drop, Both Eyes, BID PRN, WeCoralie CommonMD   ondansetron (ZOFRAN) injection 4 mg, 4 mg, Intravenous, Q6H PRN, Barrett, Erin R, PA-C   Oral care mouth rinse, 15 mL, Mouth Rinse, Q2H, WeCoralie CommonMD, 15 mL at 08/21/22 2003   Oral care mouth rinse, 15 mL, Mouth Rinse, PRN, WeCoralie CommonMD   oxyCODONE (Oxy IR/ROXICODONE) immediate release tablet 5-10 mg, 5-10 mg, Oral, Q3H PRN, Barrett, Erin R, PA-C   pantoprazole (PROTONIX) EC tablet 40 mg, 40 mg, Oral, QHS, Barrett, Erin R, PA-C   [START ON 08/23/2022] pantoprazole (PROTONIX) EC tablet 40 mg, 40 mg, Oral, Daily, Barrett, Erin R, PA-C   phenylephrine (NEO-SYNEPHRINE) 2043mS 250m21memix infusion, 0-400 mcg/min, Intravenous, Titrated, Barrett, Erin R, PA-C, Last Rate: 45 mL/hr at 08/21/22 2000, 60 mcg/min at 08/21/22 2000   rOPINIRole (REQUIP) tablet 1 mg, 1 mg, Oral, QHS, Barrett, Erin R, PA-C   rosuvastatin (CRESTOR) tablet 10 mg, 10 mg, Oral, Daily, Barrett, Erin R, PA-C   sodium chloride flush (NS) 0.9 % injection 10-40 mL, 10-40 mL, Intracatheter, Q12H, WeldCoralie Common   sodium chloride flush (NS) 0.9 % injection 10-40 mL, 10-40 mL, Intracatheter, PRN, WeldCoralie Common   [STADerrill Memo10/27/2023] sodium chloride flush (NS) 0.9 % injection 3 mL, 3 mL, Intravenous, Q12H, Barrett, Erin R, PA-C   [START ON 08/22/2022] sodium chloride flush (NS) 0.9 % injection 3 mL, 3 mL, Intravenous, PRN, Barrett, Erin R, PA-C   traMADol (ULTRAM) tablet 50-100 mg, 50-100 mg, Oral, Q4H PRN,  Barrett, Erin R, PA-C   vancomycin (VANCOCIN) IVPB 1000 mg/200 mL premix, 1,000 mg, Intravenous, Once, Barrett, Erin R, PA-C   Exam: Current vital signs: BP (!) 72/53   Pulse 69   Temp (!) 97.3 F (36.3 C)   Resp 18   Ht 5' 10"  (1.778  m)   Wt 93.4 kg   SpO2 100%   BMI 29.56 kg/m  Vital signs in last 24 hours: Temp:  [97.3 F (36.3 C)-98.4 F (36.9 C)] 97.3 F (36.3 C) (10/26 2000) Pulse Rate:  [69-91] 69 (10/26 2000) Resp:  [8-29] 18 (10/26 2000) BP: (72-133)/(53-95) 72/53 (10/26 1700) SpO2:  [95 %-100 %] 100 % (10/26 2000) Arterial Line BP: (76-185)/(39-85) 114/68 (10/26 2000) FiO2 (%):  [40 %-100 %] 50 % (10/26 1949) Weight:  [93.4 kg] 93.4 kg (10/26 0609)    NEURO:  Patient is Intubated /sedated  and Paralyzed due to use of Rocuronium  Pupils are pin point  No corneal reflex Unable to perform rest of his exam 2/2 paralytics  NIHSS :Unable to assess   Labs I have reviewed labs in epic and the results pertinent to this consultation are:  CBC    Component Value Date/Time   WBC 10.1 08/21/2022 1902   RBC 3.27 (L) 08/21/2022 1902   HGB 10.5 (L) 08/21/2022 1947   HGB 13.1 07/25/2022 1244   HCT 31.0 (L) 08/21/2022 1947   HCT 39.2 07/25/2022 1244   PLT 184 08/21/2022 1902   PLT 224 07/25/2022 1244   MCV 93.6 08/21/2022 1902   MCV 91 07/25/2022 1244   MCH 31.2 08/21/2022 1902   MCHC 33.3 08/21/2022 1902   RDW 16.1 (H) 08/21/2022 1902   RDW 17.1 (H) 07/25/2022 1244   LYMPHSABS 2.6 02/09/2022 2255   LYMPHSABS 2.2 09/20/2018 0936   MONOABS 0.7 02/09/2022 2255   EOSABS 0.2 02/09/2022 2255   EOSABS 0.1 09/20/2018 0936   BASOSABS 0.0 02/09/2022 2255   BASOSABS 0.0 09/20/2018 0936    CMP     Component Value Date/Time   NA 133 (L) 08/21/2022 1947   NA 141 07/25/2022 1244   K 4.3 08/21/2022 1947   CL 100 08/21/2022 1902   CO2 20 (L) 08/21/2022 1902   GLUCOSE 108 (H) 08/21/2022 1902   BUN 40 (H) 08/21/2022 1902   BUN 18 07/25/2022 1244   CREATININE 8.60  (H) 08/21/2022 1902   CREATININE 9.83 (H) 04/17/2022 0345   CALCIUM 7.1 (L) 08/21/2022 1902   PROT 8.6 (H) 08/19/2022 1204   PROT 8.2 05/01/2021 1055   ALBUMIN 4.5 08/19/2022 1204   ALBUMIN 5.3 (H) 05/01/2021 1055   AST 15 08/19/2022 1204   ALT 17 08/19/2022 1204   ALKPHOS 144 (H) 08/19/2022 1204   BILITOT 0.5 08/19/2022 1204   BILITOT 0.4 05/01/2021 1055   GFRNONAA 7 (L) 08/21/2022 1902   GFRNONAA 17 (L) 08/31/2017 0908   GFRAA 8 (L) 09/28/2020 1012   GFRAA 20 (L) 08/31/2017 0908    Lipid Panel     Component Value Date/Time   CHOL 113 04/17/2022 0345   CHOL 126 05/01/2021 1055   TRIG 131 04/17/2022 0345   HDL 35 (L) 04/17/2022 0345   HDL 40 05/01/2021 1055   CHOLHDL 3.2 04/17/2022 0345   VLDL 64 (H) 09/04/2021 0347   LDLCALC 57 04/17/2022 0345     Imaging I have reviewed the images obtained: CT AngioHead Neck w/w/o CM with Perfusion : 08/21/22 Narrative & Impression  CLINICAL DATA:  Mitral valve replacement today. Acute neurologic deficit postop   EXAM: CT ANGIOGRAPHY HEAD AND NECK   CT PERFUSION BRAIN   TECHNIQUE: Multidetector CT imaging of the head and neck was performed using the standard protocol during bolus administration of intravenous contrast. Multiplanar CT image reconstructions and MIPs were obtained to evaluate the vascular  anatomy. Carotid stenosis measurements (when applicable) are obtained utilizing NASCET criteria, using the distal internal carotid diameter as the denominator.   Multiphase CT imaging of the brain was performed following IV bolus contrast injection. Subsequent parametric perfusion maps were calculated using RAPID software.   RADIATION DOSE REDUCTION: This exam was performed according to the departmental dose-optimization program which includes automated exposure control, adjustment of the mA and/or kV according to patient size and/or use of iterative reconstruction technique.   CONTRAST:  196m OMNIPAQUE IOHEXOL 350 MG/ML  SOLN   COMPARISON:  CT head 08/21/2022   FINDINGS: CTA NECK FINDINGS   Aortic arch: Mild atherosclerotic calcification aortic arch. Proximal great vessels widely patent.   Right carotid system: Mild atherosclerotic calcification right carotid bulb. Negative for stenosis. Small caliber right internal carotid artery similar to that seen on the left.   Left carotid system: Mild atherosclerotic calcification proximal left internal carotid artery without significant stenosis. Small caliber left internal carotid artery similar to the right side.   Vertebral arteries: Both vertebral arteries patent to the basilar without stenosis.   Skeleton: No acute skeletal abnormality   Other neck: The patient is intubated.  NG tube in the esophagus   Upper chest: Right chest tube in place. Small right apical pneumothorax. Gas in the right chest wall from recent surgery. Mild atelectasis in the posterior lung apices bilaterally.   Review of the MIP images confirms the above findings   CTA HEAD FINDINGS   Anterior circulation: Atherosclerotic calcification in the cavernous carotid bilaterally without stenosis. Anterior and middle cerebral arteries patent without stenosis or large vessel occlusion. Negative for aneurysm.   Posterior circulation: Both vertebral arteries patent to the basilar without significant stenosis. Mild atherosclerotic calcification distal right vertebral artery without significant stenosis. PICA patent bilaterally. Basilar patent. Mild atherosclerotic calcification in the basilar. Superior cerebellar and posterior cerebral arteries patent bilaterally without stenosis.   Venous sinuses: Normal venous enhancement   Anatomic variants: None   Review of the MIP images confirms the above findings   CT Brain Perfusion Findings:   ASPECTS: 10   CBF (<30%) Volume: 523m  Perfusion (Tmax>6.0s) volume: 1034m Mismatch Volume: 5mL83mInfarction Location:Left frontal  lobe predominantly white matter   IMPRESSION: 1. CT perfusion positive for a small area of infarct and ischemia left frontal lobe predominately in the white matter. 2. Negative for large vessel occlusion 3. No significant carotid or vertebral artery stenosis in the neck. 4. Postop changes right chest. Small right pneumothorax. Right chest tube in place. 5. These results were called by telephone at the time of interpretation on 08/21/2022 at 6:38 pm to [facility] verb[facility] these results.     Assessment:  Acute Left frontal lobe infarct  Mitral Valve Regurgitation  S/P Mitral Valve ring Annuloplasty  ESRD On HD Renal Cancer s/p Left Nephrectomy HLD HTN OSA Thyroid Disease Chronic Diastolic CHF    Impression: This is 56 y28male with multiple vascular stroke risk factors s/p Mitral Valve repair w/o any complications who was successfully weaned and extubated this afternoon. He was doing well, oriented x2 then developed myoclonic jerking movements and became unresponsive and apneic. He was re intubated and stroke code was called. NIHSS was unable to be assessed given he was paralyzed with roc 15 min prior.   Patients imaging studies including CT perfusion revealed a small area of infarct and ischemia in left frontal lobe. NO LVO seen and no significant carotid  or vertebral artery stenosis seen.  Recommendations:  - Return to [place] [facility] - Permissive HTN x48 hrs from sx to maximum acceptable to CT surgery (from neuro standpoint can allow up to 220 SBP but we defer to CT surgery here). PRN labetalol or hydralazine if BP above these parameters. Avoid oral antihypertensives. - MRI brain wo contrast - TTE  - Check A1c and LDL + add statin per guidelines - Continue antiplatelet therapy: ASA 325 mg po qday  -Statin Therapy: Crestor 10 mg qday - q4 hr neuro checks or per unit protocol  - STAT head CT for any change in neuro exam - Tele - PT/OT/SLP - Amb  referral to neurology upon discharge - rEEG - Stroke team will follow tomorrow  -- Elenora Gamma, PA-C [facility]  Neurology Attending Attestation   I examined the patient and discussed plan with Mr. Alvester Chou NP. Above note has been edited by me to reflect my findings and recommendations. I was present throughout the stroke code and made all significant decisions and personally reviewed CNS imaging and also discussed CTA results with radiologist by phone. CT head showed and CT perfusion confirmed small acute infarct L frontal region. No LVO. Will perform stroke workup above and also obtain EEG given the spell of unresponsiveness that precipitated re-intubation. D/w Dr. Ina Homes and Dr. Coralie Common.   This patient is critically ill and at significant risk of neurological worsening, death and care requires constant monitoring of vital signs, hemodynamics,respiratory and cardiac monitoring, neurological assessment, discussion with family, other specialists and medical decision making of high complexity. I spent 105 minutes of neurocritical care time  in the care of  this patient. This was time spent independent of any time provided by nurse practitioner or PA.   Su Monks, MD [facility] [phone]   If 7pm- 7am, please page neurology on call as listed in [place].

## 2022-08-21 NOTE — Interval H&P Note (Signed)
History and Physical Interval Note:  08/21/2022 6:27 AM  Albert Eaton  has presented today for surgery, with the diagnosis of MR TR.  The various methods of treatment have been discussed with the patient and family. After consideration of risks, benefits and other options for treatment, the patient has consented to  Procedure(s): MINIMALLY INVASIVE MITRAL VALVE REPAIR or replacement (MVR) (N/A) MINIMALLY INVASIVE TRICUSPID VALVE REPAIR (N/A) TRANSESOPHAGEAL ECHOCARDIOGRAM (TEE) (N/A) as a surgical intervention.  The patient's history has been reviewed, patient examined, no change in status, stable for surgery.  I have reviewed the patient's chart and labs.  Questions were answered to the patient's satisfaction.     Coralie Common

## 2022-08-21 NOTE — Procedures (Signed)
Intubation Procedure Note  Albert Eaton  [id number]  24-Nov-1965  Date:08/21/22  Time:5:45 PM   Provider Performing:Jemma Rasp Loletha Grayer Tamala Julian    Procedure: Intubation ([address]00)  Indication(s) Respiratory Failure  Consent Unable to obtain consent due to emergent nature of procedure.   Anesthesia Etomidate, Versed, Fentanyl, and Rocuronium   Time Out Verified patient identification, verified procedure, site/side was marked, verified correct patient position, special equipment/implants available, medications/allergies/relevant history reviewed, required imaging and test results available.   Sterile Technique Usual hand hygeine, masks, and gloves were used   Procedure Description Patient positioned in bed supine.  Sedation given as noted above.  Patient was intubated with endotracheal tube using Glidescope.  View was Grade 1 full glottis .  Number of attempts was 1.  Colorimetric CO2 detector was consistent with tracheal placement.   Complications/Tolerance None; patient tolerated the procedure well. Chest X-ray is ordered to verify placement.   EBL Minimal   Specimen(s) None

## 2022-08-21 NOTE — Anesthesia Procedure Notes (Signed)
Arterial Line Insertion Start/End10/26/2023 7:00 AM, 08/21/2022 7:05 AM Performed by: CRNA  Patient location: Pre-op. Preanesthetic checklist: patient identified, IV checked, site marked, risks and benefits discussed, surgical consent, monitors and equipment checked, pre-op evaluation, timeout performed and anesthesia consent Lidocaine 1% used for infiltration Right, radial was placed Catheter size: 20 G Hand hygiene performed  and maximum sterile barriers used  Allen's test indicative of satisfactory collateral circulation Attempts: 1 Procedure performed without using ultrasound guided technique. Following insertion, Biopatch and dressing applied. Post procedure assessment: normal and unchanged  Patient tolerated the procedure well with no immediate complications.

## 2022-08-21 NOTE — Progress Notes (Addendum)
08/21/2022  Passed usual parameters on rapid wean, extubated, able to state where he was.  About ~10 mins in per nursing he had myoclonus of all 4 ext then became unresponsive and stopped breathing.  Sats went to 20.  Bagged until I arrived to bedside.  Was not moving or following commands on my arrival.  Intubated without issue w/ RSI due to concern for upper airway swelling (therefore rocuronium used which can cloud neuro exam).  No seizure hx.  CXR pending.  Will ask stroke neuro to see if anything other than EEG/CT head warranted.  Updated Dr. Lavonna Monarch by phone.  Attempted to call relative on face sheet but no answer.  30 min cc time independent of procedures.  Erskine Emery MD PCCM

## 2022-08-21 NOTE — Anesthesia Procedure Notes (Signed)
Central Venous Catheter Insertion Performed by: Albertha Ghee, MD, anesthesiologist Start/End10/26/2023 7:00 AM, 08/21/2022 7:15 AM Patient location: Pre-op. Preanesthetic checklist: patient identified, IV checked, site marked, risks and benefits discussed, surgical consent, monitors and equipment checked, pre-op evaluation, timeout performed and anesthesia consent Lidocaine 1% used for infiltration and patient sedated Hand hygiene performed  and maximum sterile barriers used  Catheter size: 9 Fr MAC introducer Procedure performed using ultrasound guided technique. Ultrasound Notes:anatomy identified, needle tip was noted to be adjacent to the nerve/plexus identified, no ultrasound evidence of intravascular and/or intraneural injection and image(s) printed for medical record Attempts: 1 Following insertion, line sutured and dressing applied. Post procedure assessment: blood return through all ports, free fluid flow and no air  Patient tolerated the procedure well with no immediate complications.

## 2022-08-22 ENCOUNTER — Inpatient Hospital Stay (HOSPITAL_COMMUNITY): Payer: Medicare Other

## 2022-08-22 ENCOUNTER — Encounter (HOSPITAL_COMMUNITY): Payer: Self-pay | Admitting: Thoracic Surgery (Cardiothoracic Vascular Surgery)

## 2022-08-22 DIAGNOSIS — I63442 Cerebral infarction due to embolism of left cerebellar artery: Secondary | ICD-10-CM | POA: Diagnosis not present

## 2022-08-22 DIAGNOSIS — Z9889 Other specified postprocedural states: Secondary | ICD-10-CM | POA: Diagnosis not present

## 2022-08-22 LAB — BASIC METABOLIC PANEL
Anion gap: 16 — ABNORMAL HIGH (ref 5–15)
Anion gap: 17 — ABNORMAL HIGH (ref 5–15)
BUN: 44 mg/dL — ABNORMAL HIGH (ref 6–20)
BUN: 50 mg/dL — ABNORMAL HIGH (ref 6–20)
CO2: 19 mmol/L — ABNORMAL LOW (ref 22–32)
CO2: 19 mmol/L — ABNORMAL LOW (ref 22–32)
Calcium: 7.9 mg/dL — ABNORMAL LOW (ref 8.9–10.3)
Calcium: 7.6 mg/dL — ABNORMAL LOW (ref 8.9–10.3)
Chloride: 100 mmol/L (ref 98–111)
Chloride: 99 mmol/L (ref 98–111)
Creatinine, Ser: 9.31 mg/dL — ABNORMAL HIGH (ref 0.61–1.24)
Creatinine, Ser: 10.56 mg/dL — ABNORMAL HIGH (ref 0.61–1.24)
GFR, Estimated: 6 mL/min — ABNORMAL LOW (ref >60)
GFR, Estimated: 5 mL/min — ABNORMAL LOW (ref >60)
Glucose, Bld: 102 mg/dL — ABNORMAL HIGH (ref 70–99)
Glucose, Bld: 99 mg/dL (ref 70–99)
Potassium: 4.2 mmol/L (ref 3.5–5.1)
Potassium: 4.5 mmol/L (ref 3.5–5.1)
Sodium: 135 mmol/L (ref 135–145)
Sodium: 135 mmol/L (ref 135–145)

## 2022-08-22 LAB — CBC
HCT: 29.6 % — ABNORMAL LOW (ref 39.0–52.0)
HCT: 26.9 % — ABNORMAL LOW (ref 39.0–52.0)
Hemoglobin: 10.2 g/dL — ABNORMAL LOW (ref 13.0–17.0)
Hemoglobin: 8.9 g/dL — ABNORMAL LOW (ref 13.0–17.0)
MCH: 31.9 pg (ref 26.0–34.0)
MCH: 31.2 pg (ref 26.0–34.0)
MCHC: 34.5 g/dL (ref 30.0–36.0)
MCHC: 33.1 g/dL (ref 30.0–36.0)
MCV: 92.5 fL (ref 80.0–100.0)
MCV: 94.4 fL (ref 80.0–100.0)
Platelets: 166 10*3/uL (ref 150–400)
Platelets: 126 10*3/uL — ABNORMAL LOW (ref 150–400)
RBC: 3.2 MIL/uL — ABNORMAL LOW (ref 4.22–5.81)
RBC: 2.85 MIL/uL — ABNORMAL LOW (ref 4.22–5.81)
RDW: 16.2 % — ABNORMAL HIGH (ref 11.5–15.5)
RDW: 16.8 % — ABNORMAL HIGH (ref 11.5–15.5)
WBC: 9.1 10*3/uL (ref 4.0–10.5)
WBC: 10.4 10*3/uL (ref 4.0–10.5)
nRBC: 0.3 % — ABNORMAL HIGH (ref 0.0–0.2)
nRBC: 0.3 % — ABNORMAL HIGH (ref 0.0–0.2)

## 2022-08-22 LAB — POCT I-STAT 7, (LYTES, BLD GAS, ICA,H+H)
Acid-base deficit: 5 mmol/L — ABNORMAL HIGH (ref 0.0–2.0)
Bicarbonate: 19.5 mmol/L — ABNORMAL LOW (ref 20.0–28.0)
Calcium, Ion: 1.04 mmol/L — ABNORMAL LOW (ref 1.15–1.40)
HCT: 29 % — ABNORMAL LOW (ref 39.0–52.0)
Hemoglobin: 9.9 g/dL — ABNORMAL LOW (ref 13.0–17.0)
O2 Saturation: 99 %
Patient temperature: 37.2
Potassium: 4.2 mmol/L (ref 3.5–5.1)
Sodium: 134 mmol/L — ABNORMAL LOW (ref 135–145)
TCO2: 20 mmol/L — ABNORMAL LOW (ref 22–32)
pCO2 arterial: 32.2 mmHg (ref 32–48)
pH, Arterial: 7.39 (ref 7.35–7.45)
pO2, Arterial: 137 mmHg — ABNORMAL HIGH (ref 83–108)

## 2022-08-22 LAB — HEMOGLOBIN A1C
Hgb A1c MFr Bld: 5.6 % (ref 4.8–5.6)
Mean Plasma Glucose: 114.02 mg/dL

## 2022-08-22 LAB — ECHO INTRAOPERATIVE TEE
Height: 70 in
MV M vel: 4.45 m/s
MV Peak grad: 79.2 mmHg
MV Vena cont: 0.36 cm
Weight: 3296 oz

## 2022-08-22 LAB — GLUCOSE, CAPILLARY
Glucose-Capillary: 105 mg/dL — ABNORMAL HIGH (ref 70–99)
Glucose-Capillary: 111 mg/dL — ABNORMAL HIGH (ref 70–99)
Glucose-Capillary: 113 mg/dL — ABNORMAL HIGH (ref 70–99)
Glucose-Capillary: 116 mg/dL — ABNORMAL HIGH (ref 70–99)
Glucose-Capillary: 117 mg/dL — ABNORMAL HIGH (ref 70–99)
Glucose-Capillary: 118 mg/dL — ABNORMAL HIGH (ref 70–99)
Glucose-Capillary: 67 mg/dL — ABNORMAL LOW (ref 70–99)
Glucose-Capillary: 69 mg/dL — ABNORMAL LOW (ref 70–99)
Glucose-Capillary: 73 mg/dL (ref 70–99)
Glucose-Capillary: 88 mg/dL (ref 70–99)
Glucose-Capillary: 89 mg/dL (ref 70–99)

## 2022-08-22 LAB — ECHOCARDIOGRAM COMPLETE BUBBLE STUDY
Area-P 1/2: 3.65 cm2
Calc EF: 62.6 %
S' Lateral: 4.7 cm
Single Plane A2C EF: 55.8 %
Single Plane A4C EF: 67.2 %

## 2022-08-22 LAB — MAGNESIUM
Magnesium: 2.8 mg/dL — ABNORMAL HIGH (ref 1.7–2.4)
Magnesium: 2.8 mg/dL — ABNORMAL HIGH (ref 1.7–2.4)

## 2022-08-22 LAB — LDL CHOLESTEROL, DIRECT: Direct LDL: 23 mg/dL (ref 0–99)

## 2022-08-22 MED ORDER — ORAL CARE MOUTH RINSE: 15.0000 mL | OROMUCOSAL | Status: DC | PRN

## 2022-08-22 MED ORDER — ALBUTEROL SULFATE (2.5 MG/3ML) 0.083% IN NEBU
2.5000 mg | INHALATION_SOLUTION | RESPIRATORY_TRACT | Status: DC | PRN
  Filled 2022-08-22: qty 3

## 2022-08-22 MED ORDER — PRISMASOL BGK 4/2.5 32-4-2.5 MEQ/L REPLACEMENT SOLN: Status: DC

## 2022-08-22 MED ORDER — INSULIN ASPART 100 UNIT/ML IJ SOLN
0.0000 [IU] | INTRAMUSCULAR | Status: DC
  Administered 2022-08-23 (×2): 2 [IU] via SUBCUTANEOUS

## 2022-08-22 MED ORDER — METOCLOPRAMIDE HCL 5 MG/ML IJ SOLN
5.0000 mg | Freq: Three times a day (TID) | INTRAMUSCULAR | Status: DC
  Administered 2022-08-22 – 2022-08-27 (×14): 5 mg via INTRAVENOUS
  Filled 2022-08-22 (×13): qty 2

## 2022-08-22 MED ORDER — ALBUTEROL SULFATE (2.5 MG/3ML) 0.083% IN NEBU
INHALATION_SOLUTION | RESPIRATORY_TRACT | Status: AC
  Administered 2022-08-22: 2.5 mg
  Filled 2022-08-22: qty 3

## 2022-08-22 MED ORDER — GABAPENTIN 100 MG PO CAPS
100.0000 mg | ORAL_CAPSULE | Freq: Three times a day (TID) | ORAL | Status: DC | PRN
  Administered 2022-08-24: 100 mg via ORAL
  Filled 2022-08-22: qty 1

## 2022-08-22 MED ORDER — CALCITRIOL 0.25 MCG PO CAPS
1.7500 ug | ORAL_CAPSULE | ORAL | Status: DC
  Administered 2022-08-25: 1.75 ug via ORAL
  Filled 2022-08-22: qty 7

## 2022-08-22 MED ORDER — HEPARIN SODIUM (PORCINE) 1000 UNIT/ML DIALYSIS
1000.0000 [IU] | INTRAMUSCULAR | Status: DC | PRN
  Administered 2022-08-25 (×2): 1400 [IU] via INTRAVENOUS_CENTRAL
  Filled 2022-08-22 (×2): qty 6
  Filled 2022-08-22: qty 4
  Filled 2022-08-22: qty 1

## 2022-08-22 MED ORDER — PRISMASOL BGK 4/2.5 32-4-2.5 MEQ/L EC SOLN: Status: DC

## 2022-08-22 NOTE — Procedures (Signed)
Central Venous Catheter Insertion Procedure Note  Albert Eaton  [id number]  20-Jan-1966  Date:08/22/22  Time:6:24 PM   Provider Performing:Dilynn Munroe Cipriano Mile   Procedure: Insertion of Non-tunneled Central Venous [phone]) with US guidance ([id number])   Indication(s) Hemodialysis  Consent Risks of the procedure as well as the alternatives and risks of each were explained to the patient and/or caregiver.  Consent for the procedure was obtained and is signed in the bedside chart  Anesthesia Topical only with 1% lidocaine   Timeout Verified patient identification, verified procedure, site/side was marked, verified correct patient position, special equipment/implants available, medications/allergies/relevant history reviewed, required imaging and test results available.  Sterile Technique Maximal sterile technique including full sterile barrier drape, hand hygiene, sterile gown, sterile gloves, mask, hair covering, sterile ultrasound probe cover (if used).  Procedure Description Area of catheter insertion was cleaned with chlorhexidine and draped in sterile fashion.  With real-time ultrasound guidance a HD catheter was placed into the left internal jugular vein. Nonpulsatile blood flow and easy flushing noted in all ports.  The catheter was sutured in place and sterile dressing applied.  Complications/Tolerance None; patient tolerated the procedure well. Chest X-ray is ordered to verify placement for internal jugular or subclavian cannulation.   Chest x-ray is not ordered for femoral cannulation.  EBL Minimal  Specimen(s) None

## 2022-08-22 NOTE — Procedures (Signed)
Extubation Procedure Note  Patient Details:   Name: Albert Eaton DOB: 1966/05/29 MRN: [id number]   Airway Documentation:    Vent end date: 08/22/22 Vent end time: 0837   Evaluation  O2 sats: stable throughout Complications: No apparent complications Patient did tolerate procedure well. Bilateral Breath Sounds: Diminished, Clear   Yes  Pt extubated to 2L [place], pt tolerating well at this time. Cuff leak present, no stridor noted, RN at bedside, MD aware, RT will monitor.   Carren Rang 08/22/2022, 8:37 AM

## 2022-08-22 NOTE — Discharge Summary (Signed)
[address]       ,[place] [id number]             [phone]    Physician Discharge Summary  Patient ID: Albert Eaton MRN: [id number] DOB/AGE: May 24, 1966 56 y.o.  Admit date: 08/21/2022 Discharge date: 08/28/2022  Admission Diagnoses:  Patient Active Problem List   Diagnosis Date Noted   Chronic pain of right ankle 06/26/2022   Autonomic neuropathy in diseases classified elsewhere 04/21/2022   Atrial fibrillation with RVR ([place]) 12/02/2021   Laryngopharyngeal reflux (LPR) 11/11/2021   Chest pain of uncertain etiology    Elevated troponin    NSTEMI (non-ST elevated myocardial infarction) ([place]) 09/03/2021   Essential hypertension 09/03/2021   Renal cell carcinoma ([place]) 06/27/2021   Generalized (acute) peritonitis ([place]) 06/24/2021   Other disorders of bilirubin metabolism 06/24/2021   Renal mass 02/18/2021   Pain, unspecified 02/08/2021   SVT (supraventricular tachycardia) 01/18/2021   Lower abdominal pain 06/26/2020   Contact with and (suspected) exposure to tuberculosis 04/02/2020   Fluid overload, unspecified 04/02/2020   Pulsatile tinnitus of right ear 11/21/2019   Kidney failure 78/24/2353   Acute diastolic CHF (congestive heart failure) ([place]) 11/21/2019   Chills (without fever) 11/07/2019   Hypercalcemia 10/17/2019   Dependence on renal dialysis ([place]) 06/06/2019   Coagulation defect, unspecified ([place]) 05/23/2019   Iron deficiency anemia, unspecified 05/16/2019   Anemia in other chronic diseases classified elsewhere 05/13/2019   Arteriovenous fistula, acquired ([place]) 05/13/2019   Body mass index (BMI) 28.0-28.9, adult 05/13/2019   Crohn's disease of small intestine without complications ([place]) 61/44/3154   Encounter for screening for respiratory tuberculosis 05/13/2019   Gout, unspecified 05/13/2019   Nausea 05/13/2019   Other fatigue 05/13/2019   Secondary hyperparathyroidism of renal origin ([place]) 05/13/2019   ESRD (end stage renal disease)  on dialysis ([place]) 05/13/2019   Chronic diastolic (congestive) heart failure ([place]) 05/13/2019   Chronic kidney disease, unspecified 05/13/2019   Obstructive sleep apnea 05/13/2019   Discomfort of right ear 02/18/2019   Hypokalemia 06/13/2018   Abnormal CT scan, small bowel    Perennial allergic rhinitis with a predominant nonallergic component 02/09/2018   Allergic conjunctivitis 02/09/2018   Mild intermittent asthma 02/09/2018   Atherosclerosis 12/09/2017   Hypothyroidism 11/19/2017   Mixed hyperlipidemia 10/13/2017   Personal history of gout 10/13/2017   S/P cholecystectomy 10/13/2017   Congestive heart failure with LV diastolic dysfunction, NYHA class 1 ([place]) 10/13/2017   Hypothyroidism (acquired) 10/13/2017   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[place]) 07/31/2017   SOB (shortness of breath) 06/07/2017   Arthritis, gouty 01/27/2017   Chronic diastolic CHF (congestive heart failure) ([place]) 01/06/2017   OSA (obstructive sleep apnea) 01/06/2017   Abnormal electrocardiogram (ECG) (EKG) 10/31/2016   Chest pain 10/09/2016   HIV disease ([place]) 10/09/2016   Hypertensive heart disease with CHF (congestive heart failure) ([place]) 10/09/2016   Left-sided low back pain without sciatica 05/29/2015   NYHA class 2 heart failure with preserved ejection fraction ([place]) 05/15/2015   PAH (pulmonary artery hypertension) ([place]) 05/08/2015   Severe mitral regurgitation 05/04/2015   Prolonged Q-T interval on ECG 05/04/2015   Discharge Diagnoses:  Patient Active Problem List   Diagnosis Date Noted   Cerebrovascular accident (CVA) due to embolism of left cerebellar artery ([place])    S/P MVR (mitral valve repair) 08/21/2022   Chronic pain of right ankle 06/26/2022   Autonomic neuropathy in diseases classified elsewhere 04/21/2022   Atrial fibrillation with RVR ([place])  12/02/2021   Laryngopharyngeal reflux (LPR) 11/11/2021   Chest pain of uncertain etiology    Elevated troponin    NSTEMI (non-ST elevated myocardial  infarction) ([place]) 09/03/2021   Essential hypertension 09/03/2021   Renal cell carcinoma ([place]) 06/27/2021   Generalized (acute) peritonitis ([place]) 06/24/2021   Other disorders of bilirubin metabolism 06/24/2021   Renal mass 02/18/2021   Pain, unspecified 02/08/2021   SVT (supraventricular tachycardia) 01/18/2021   Lower abdominal pain 06/26/2020   Contact with and (suspected) exposure to tuberculosis 04/02/2020   Fluid overload, unspecified 04/02/2020   Pulsatile tinnitus of right ear 11/21/2019   Kidney failure 00/92/3300   Acute diastolic CHF (congestive heart failure) ([place]) 11/21/2019   Chills (without fever) 11/07/2019   Hypercalcemia 10/17/2019   Dependence on renal dialysis ([place]) 06/06/2019   Coagulation defect, unspecified ([place]) 05/23/2019   Iron deficiency anemia, unspecified 05/16/2019   Anemia in other chronic diseases classified elsewhere 05/13/2019   Arteriovenous fistula, acquired ([place]) 05/13/2019   Body mass index (BMI) 28.0-28.9, adult 05/13/2019   Crohn's disease of small intestine without complications ([place]) 76/22/6333   Encounter for screening for respiratory tuberculosis 05/13/2019   Gout, unspecified 05/13/2019   Nausea 05/13/2019   Other fatigue 05/13/2019   Secondary hyperparathyroidism of renal origin ([place]) 05/13/2019   ESRD (end stage renal disease) on dialysis ([place]) 05/13/2019   Chronic diastolic (congestive) heart failure ([place]) 05/13/2019   Chronic kidney disease, unspecified 05/13/2019   Obstructive sleep apnea 05/13/2019   Discomfort of right ear 02/18/2019   Hypokalemia 06/13/2018   Abnormal CT scan, small bowel    Perennial allergic rhinitis with a predominant nonallergic component 02/09/2018   Allergic conjunctivitis 02/09/2018   Mild intermittent asthma 02/09/2018   Atherosclerosis 12/09/2017   Hypothyroidism 11/19/2017   Mixed hyperlipidemia 10/13/2017   Personal history of gout 10/13/2017   S/P cholecystectomy 10/13/2017   Congestive heart  failure with LV diastolic dysfunction, NYHA class 1 ([place]) 10/13/2017   Hypothyroidism (acquired) 10/13/2017   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[place]) 07/31/2017   SOB (shortness of breath) 06/07/2017   Arthritis, gouty 01/27/2017   Chronic diastolic CHF (congestive heart failure) ([place]) 01/06/2017   OSA (obstructive sleep apnea) 01/06/2017   Abnormal electrocardiogram (ECG) (EKG) 10/31/2016   Chest pain 10/09/2016   HIV disease ([place]) 10/09/2016   Hypertensive heart disease with CHF (congestive heart failure) ([place]) 10/09/2016   Left-sided low back pain without sciatica 05/29/2015   NYHA class 2 heart failure with preserved ejection fraction ([place]) 05/15/2015   PAH (pulmonary artery hypertension) ([place]) 05/08/2015   Severe mitral regurgitation 05/04/2015   Prolonged Q-T interval on ECG 05/04/2015   Discharged Condition: good  History of Present Illness:  Ewel Coller is a very pleasant 56 yo AAM with history of HIV, ESRD, Renal Cancer, GERD, Hep B, Hypothyroidism, and MR.  He has been on dialysis since 2019 secondary to HTN and he is also sp Left nephrectomy for cancer. He has had atrial arrhythmias in the past that was determined best to be treated medically. He is normally in NSR and not on anticoagulation. He has been suffering more over the past 6 months with DOE that occurs when he pushes himself and going up flights of stairs. He also more recently has had intermittent CP that occurs at rest and with exertion. He had a cath last November with moderate disease throughout. In a workup of known MR, Dr [name] arranged a recent TEE that revealed severe MR (without flow reversal) thought to be secondary to  Type I annular dilation. He had a dilated LA and in addition had moderate TR. Pt was felt at his age to best be served with repair and was referred for consideration. Pt feels that the hemodialysis has been better at keeping his volume status better managed than his peritoneal dialysis. Pt reports that he is  also considered for renal transplant. He is having his teeth evaluated early next month.  He was evaluated by Dr. Lavonna Monarch who recommended Mitral Valve Repair via Minimally Invasive approach.  The risks and benefits of the procedure were explained to the patient and he was agreeable to proceed.  Hospital Course:  Ivor Carby presented to [facility] on [facility]20[facility].  He was taken to the operating room and underwent Minimally Invasive Mitral Valve Repair.  He tolerated the procedure without difficulty and was taken to the SICU in stable condition.  The patient was extubated the evening of surgery.  However post extubation the patient started exhibiting jerking movements with elevated blood pressure.  This lasted briefly, but at end of episode the patient was not breathing and was cyanotic.  He required re-intubation performed by critical care physician.  Neurology consult was obtained and EEG was performed which showed no evidence of seizures.  The patient was weaned off Phenylephrine and Levophed as hemodynamics allowed.  He was weaned and again extubated on POD #1.  CT scan of the head was obtained and showed small left frontal lobe white matter infarct.  MRI would be required to fully evaluate;however, this was not needed because neuro status improved and returned to baseline.  He has a history of CKD and receives HD. Nephrology was consulted and CRRT was initiated.  He was transitioned to HD on 08/26/2022 . Chest tubes were removed on 10/28.  The patient was transitioned to oral Amiodarone.  The patient's temporary dialysis catheter was removed on 08/27/2022.  Outpatient dialysis was confirmed.  The patient's surgical incisions are healing without evidence of infection.  The patient is stable for discharge home today.  Consults: pulmonary/intensive care and nephrology  Significant Diagnostic Studies: cardiac graphics:   Echocardiogram:   IMPRESSIONS    1. Severe mitral valve regurgitation.  Mechanism appears likely atrial  functional and MR is commissural. No definite pulmonary vein reversals in  systole, may be due to low blood pressure during sedation. At ambulatory  blood pressures, suspect MR is  severe.. The mitral valve is grossly normal. Severe mitral valve  regurgitation. No evidence of mitral stenosis.   2. Left ventricular ejection fraction, by estimation, is 55%. The left  ventricle has normal function. The left ventricular internal cavity size  was mildly dilated.   3. Right ventricular systolic function is normal. The right ventricular  size is mildly enlarged.   4. Left atrial size was severely dilated. No left atrial/left atrial  appendage thrombus was detected. The LAA emptying velocity was 62 cm/s.   5. Right atrial size was moderately dilated.   6. Tricuspid valve regurgitation is moderate.   7. The aortic valve is tricuspid. Aortic valve regurgitation is trivial.  No aortic stenosis is present.   8. Aortic dilatation noted. There is mild dilatation of the ascending  aorta, measuring 40 mm. There is mild (Grade II) atheroma plaque involving  the aortic arch and descending aorta.   9. Agitated saline contrast bubble study was negative, with no evidence  of any interatrial shunt.     Treatments: surgery:   08/21/2022 [place] Usrey Gustafson [id number]  Surgeon:  Everlena Cooper, MD   First Assistant: Oswaldo Milian MD                               An experienced assistant was required given the complexity of this surgery and the standard of surgical care. The assistant was needed for exposure, dissection, suctioning, retraction of delicate tissues and sutures, instrument exchange and for overall help during this procedure.       Preoperative Diagnosis:  Severe Mitral Regurgitation and moderate Tricuspid Regurgitation   Postoperative Diagnosis:  Same but Tricuspid regurgitation mild      Procedure:   Right Mini thoroctomy Extracorporeal circulation 3.    Mitral valve ring annuloplasty using a 30 mm Simulus semi-rigid annuloplasty band (SN [id number]   Discharge Exam: Blood pressure 108/74, pulse 87, temperature 98.6 F (37 C), temperature source Oral, resp. rate 20, height _0  (1.778 m), weight 92.5 kg, SpO2 93 %.  General appearance: alert, cooperative, and no distress Heart: regular rate and rhythm Lungs: clear to auscultation bilaterally Abdomen: soft, non-tender; bowel sounds normal; no masses,  no organomegaly Extremities: extremities normal, atraumatic, no cyanosis or edema Wound: clean and dry   Discharge Medications:  The patient has been discharged on:   1.Beta Blocker:  Yes [ X  ]                              No   [   ]                              If No, reason:  2.Ace Inhibitor/ARB: Yes [   ]                                     No  [ X   ]                                     If No, reason: ESRD  3.Statin:   Yes [ X  ]                  No  [   ]                  If No, reason:  4.Ecasa:  Yes  [  X ]                  No   [   ]                  If No, reason:  Patient had ACS upon admission: No   Plavix/P2Y12 inhibitor: Yes [   ]                                      No  [ x  ]  Discharge Instructions     Amb Referral to Cardiac Rehabilitation   Complete by: As directed    Diagnosis: Valve Repair   Valve: Mitral   After initial evaluation and assessments completed: Virtual Based Care may be provided alone or  in conjunction with Phase 2 Cardiac Rehab based on patient barriers.: Yes   Intensive Cardiac Rehabilitation (ICR) [place] location only OR Traditional Cardiac Rehabilitation (TCR) *If criteria for ICR are not met will enroll in TCR [facility] only): Yes      Allergies as of 08/27/2022       Reactions   Ace Inhibitors Cough, Other (See Comments)        Medication List     STOP taking these medications    allopurinol 100 MG tablet Commonly known as: ZYLOPRIM   hydrALAZINE 100 MG tablet Commonly  known as: APRESOLINE       TAKE these medications    Acetaminophen Extra Strength 500 MG Tabs Take 1,000 mg by mouth every 6 (six) hours as needed (pain.).   amiodarone 200 MG tablet Commonly known as: PACERONE Take 1 tablet (200 mg total) by mouth 2 (two) times daily. X 7 days, then decrease to 200 mg daily   amLODipine 10 MG tablet Commonly known as: NORVASC Take 1 tablet (10 mg total) by mouth daily. What changed: when to take this   aspirin EC 81 MG tablet Take 1 tablet (81 mg total) by mouth daily. Swallow whole.   azelastine 0.1 % nasal spray Commonly known as: ASTELIN Place 2 sprays into both nostrils 2 (two) times daily as needed for allergies.   Biktarvy 50-200-25 MG Tabs tablet Generic drug: bictegravir-emtricitabine-tenofovir AF TAKE ONE TABLET BY MOUTH EVERY DAY   calcitRIOL 0.25 MCG capsule Commonly known as: ROCALTROL Take 7 capsules (1.75 mcg total) by mouth every Monday, Wednesday, and Friday with hemodialysis.   desloratadine 5 MG tablet Commonly known as: Clarinex Take 1 tablet (5 mg total) by mouth daily. What changed: when to take this   DIALYVITE PO Take 1 tablet by mouth daily.   famotidine 20 MG tablet Commonly known as: PEPCID Take 1 tablet (20 mg total) by mouth 2 (two) times daily. What changed:  when to take this reasons to take this   ferric citrate 1 GM 210 MG(Fe) tablet Commonly known as: AURYXIA Take 420 mg by mouth with breakfast, with lunch, and with evening meal.   gabapentin 300 MG capsule Commonly known as: NEURONTIN TAKE 1 CAPSULE(300 MG) BY MOUTH THREE TIMES DAILY What changed: See the new instructions.   ipratropium 0.06 % nasal spray Commonly known as: ATROVENT USE 2 SPRAYS IN EACH NOSTRIL 2 TO 3 TIMES DAILY AS NEEDED   lactulose 10 GM/15ML solution Commonly known as: CHRONULAC Take 20 g by mouth daily as needed for moderate constipation.   levalbuterol 45 MCG/ACT inhaler Commonly known as: XOPENEX HFA Inhale  2 puffs into the lungs every 4 (four) hours as needed for wheezing.   levothyroxine 25 MCG tablet Commonly known as: SYNTHROID Take 25 mcg by mouth daily before breakfast.   lubiprostone 24 MCG capsule Commonly known as: AMITIZA Take 1 capsule (24 mcg total) by mouth 2 (two) times daily with a meal. NEEDS OFFICE VISIT FOR FURTHER REFILLS   metoprolol tartrate 25 MG tablet Commonly known as: LOPRESSOR Take 1 tablet (25 mg total) by mouth 2 (two) times daily. What changed: how much to take   MIRCERA IJ Inject 1 each into the skin every 30 (thirty) days. Administered at dialysis center once a month   oxyCODONE 5 MG immediate release tablet Commonly known as: Oxy IR/ROXICODONE Take 1 tablet (5 mg total) by mouth every 4 (four) hours as needed for severe pain.   pantoprazole 40 MG tablet  Commonly known as: PROTONIX Take 1 tablet (40 mg total) by mouth daily. **PLEASE CONTACT THE OFFICE TO SCHEDULE FOLLOW UP What changed:  when to take this additional instructions   Pataday 0.7 % Soln Generic drug: Olopatadine HCl Place 1 drop into both eyes 2 (two) times daily as needed (allergies).   rOPINIRole 1 MG tablet Commonly known as: REQUIP TAKE ONE TABLET BY MOUTH AT BEDTIME   rosuvastatin 10 MG tablet Commonly known as: CRESTOR TAKE ONE TABLET BY MOUTH DAILY   Sensipar 60 MG tablet Generic drug: cinacalcet Take 60 mg by mouth daily with supper.   Spacer/Aero-Holding Dorise Bullion 1 Device by Does not apply route 2 (two) times a day.   Symbicort 160-4.5 MCG/ACT inhaler Generic drug: budesonide-formoterol Inhale 2 puffs into the lungs daily.   Vitamin D (Ergocalciferol) 1.25 MG (50000 UNIT) Caps capsule Commonly known as: DRISDOL Take 50,000 Units by mouth every Tuesday.        Follow-up Information     [facility] [place] Follow up on 09/02/2022.   Specialty: Cardiothoracic Surgery Why: Appointment is at 3:00 Contact  information: [place], [place] [employer] [place] Follow up on 09/02/2022.   Why: Appointment is at 2:00 for a chest x ray Contact information: [place], [facility]. Go on 08/28/2022.   Why: Out-pt HD appointment for Thursday- arrive at 7:35 am for 7:55 am chair time.  Go to normal scheduled appointment on Friday as well. Contact information: [place] [address] [phone]         Marylu Lund., NP Follow up on 08/28/2022.   Specialty: Nurse Practitioner Why: Appointment is at 10:05 Contact information: [address] [place] [address] [phone]                 Signed:  Ellwood Handler, PA-C  08/28/2022, 8:32 AM

## 2022-08-22 NOTE — Progress Notes (Signed)
[facility] Progress Note Patient Name: Albert Eaton DOB: 1966-09-30 MRN: [id number]   Date of Service  08/22/2022  HPI/Events of Note  ABG on 50%/PRVC 18/TV 580/P 5 = 7.39/32.2/137/19.5. ABG results satisfactory.  eICU Interventions  Defer any changes in management to TCTS.     Intervention Category Major Interventions: Respiratory failure - evaluation and management  Amanda Steuart Eugene 08/22/2022, 5:46 AM

## 2022-08-22 NOTE — Progress Notes (Signed)
NAME:  Albert Eaton, MRN:  [id number], DOB:  [birth year], LOS: 1 ADMISSION DATE:  08/21/2022, CONSULTATION DATE:  08/21/22 REFERRING MD:  Lavonna Monarch, CHIEF COMPLAINT:  SOB   History of Present Illness:  56 year old man with hx of ESRD on HD, prior RCC s/p nephrectomy, HIV, asthma, sleep apnea who presents with symptomatic mitral insufficiency and underwent mini thoracotomy today with mitral valve ring annuloplasty with bypass support (82 min crossclamp, 130 min total).  He arrives back to the unit on pressors, vent and PCCM to assist with management.  Pertinent  Medical History   Past Medical History:  Diagnosis Date   Anemia    Aortic atherosclerosis (HCC)    Arthritis    Asthma    Cancer ([place])    renal cyst   Chronic diastolic CHF (congestive heart failure) ([place]) 01/06/2017   Echo 7/16 [facility] in [place], [name]) Mild AI, mild LAE, mild concentric LVH, EF 55, normal wall motion, mild to moderate MR, mild PI, RVSP 55 // Echo 10/09/16 ([facility]):  Moderate LVH, grade 2 diastolic dysfunction, mild MR, moderate LAE    CKD (chronic kidney disease)    Dialysis Mon Wed Fri   Esophagitis    GERD (gastroesophageal reflux disease)    Gout    no current problems   Heart murmur    never caused any problems   Hepatitis    Hep B   History of nuclear stress test    a. Nuc study 7/16: no scar or ischemia, EF 42 // b. Nuc study 12/17: EF 48, ?small apical ischemia, Low Risk   HIV (human immunodeficiency virus infection) ([place])    HLD (hyperlipidemia)    Hypertension    Hypertensive heart disease with CHF (congestive heart failure) ([place]) 10/09/2016   Hypothyroidism    Mitral regurgitation    Neuropathy    OSA (obstructive sleep apnea)    Post-operative nausea and vomiting    1 time as a child, no problems as an adult   Sleep apnea    uses c-pap   Thyroid disease    Wears glasses    Wears partial dentures      Significant Hospital Events: Including procedures, antibiotic start and  stop dates in addition to other pertinent events   08/21/22 Right Mini thoroctomy Extracorporeal circulation 3.   Mitral valve ring annuloplasty using a 30 mm Simulus semi-rigid annuloplasty band (SN [id number]  Extubation followed by seizure-like episode, apnea, emergent reintubation, code stroke>> complete L frontal infarct  Interim History / Subjective:  EEG looks okay. He is awake following commands on PS.  Objective   Blood pressure (!) 72/53, pulse 73, temperature 99 F (37.2 C), resp. rate 18, height 5' 10"  (1.778 m), weight 102 kg, SpO2 100 %. PAP: (29-55)/(12-32) 37/17 CO:  [5.1 L/min-5.8 L/min] 5.8 L/min CI:  [2.4 L/min/m2-2.8 L/min/m2] 2.8 L/min/m2  Vent Mode: PRVC FiO2 (%):  [40 %-100 %] 50 % Set Rate:  [12 bmp-18 bmp] 18 bmp Vt Set:  [580 mL] 580 mL PEEP:  [5 cmH20] 5 cmH20 Pressure Support:  [10 cmH20] 10 cmH20 Plateau Pressure:  [19 cmH20] 19 cmH20   Intake/Output Summary (Last 24 hours) at 08/22/2022 0815 Last data filed at 08/22/2022 0700 Gross per 24 hour  Intake 5386.16 ml  Output 480 ml  Net 4906.16 ml    Filed Weights   08/21/22 0609 08/22/22 0500  Weight: 93.4 kg 102 kg    Examination: No distress Mild lip swelling stable Moves all  4 ext to command Ext minimal edema R incision sites CDI Minimal output from drains  CXR mild edema BUN/Cr rising as expected, K okay  Resolved Hospital Problem list   N/A  Assessment & Plan:  S/P mitral valve annuloplasty with CPB and mini thoracotomy L frontal stroke- no obvious deficits at present ?seizure activity Hx ESRD with intermittent issues with vasoplegia HIV- CD4 694 04/17/22, undetectable load, on ART HTN, HLD, Hypothyroidism, OSA, Asthma  - Repeat extubation trial today if does okay on SBT - Drain, pressor management per primary - GDMT and further stroke workup per neurology - Switch to SSI - Brovana/pulmicort - iHD per nephrology - Continue ART, synthroid  Best Practice (right click and  "Reselect all SmartList Selections" daily)   Diet/type: NPO DVT prophylaxis: not indicated GI prophylaxis: PPI Lines: Central line Foley:  Yes, and it is still needed Code Status:  full code Last date of multidisciplinary goals of care discussion [per primary]  31 min cc time Erskine Emery MD PCCM

## 2022-08-22 NOTE — Consult Note (Signed)
[facility] Renal Consultation Note  Requesting MD: Coralie Common, MD Indication for Consultation:  ESRD  Chief complaint: came in for surgery  HPI:  Albert Eaton is a 56 y.o. male with a history of ESRD, chronic diastolic CHF, HTN, HIV, OSA, severe mitral regurgitation, and hepatitis B who presented to the hospital for planned mitral valve repair.  He had elected to have surgery as he had been experiencing dyspnea on exertion.  He last dialyzed on 10/25 and left quite a bit above his EDW as below (EDW 85 kg and last post weight 93.6 kg on 08/20/22).  He underwent right mini thoroctomy and mitral valve right annuloplasty on 08/21/22.  Note post-op he had an episode of diffuse myoclonus and respiratory arrest requiring reintubation.  Had a left frontal infarct noted on imaging which was felt to be cardioembolic event secondary to recent cardiac procedure.  Then had event of full body myoclonus felt related to metabolic derangements and hypercapnia.  Neurology has been following.  He has been on levo and neo today.  Levo is off and neo is at 20 mcg/min.  He was extubated earlier today.  He feels really overloaded and states that his blood pressure was dropping during his last outpatient treatment.  He had recently asked them to raise his EDW by 2 kg to 87 kg.  Nephrology is consulted for assistance with management of ESRD.  He has been seen by critical care and they are able to place a tunneled catheter for CRRT.  His nephew is now at bedside - he states that this is his power of attorney.  Patient does confirm that he has hepatitis and dialyzes in the isolation room at [place].   PMHx:   Past Medical History:  Diagnosis Date   Anemia    Aortic atherosclerosis (HCC)    Arthritis    Asthma    Cancer ([place])    renal cyst   Chronic diastolic CHF (congestive heart failure) ([place]) 01/06/2017   Echo 7/16 [facility] in [place], [name]) Mild AI, mild LAE, mild concentric LVH, EF 55,  normal wall motion, mild to moderate MR, mild PI, RVSP 55 // Echo 10/09/16 ([facility]):  Moderate LVH, grade 2 diastolic dysfunction, mild MR, moderate LAE    CKD (chronic kidney disease)    Dialysis Mon Wed Fri   Esophagitis    GERD (gastroesophageal reflux disease)    Gout    no current problems   Heart murmur    never caused any problems   Hepatitis    Hep B   History of nuclear stress test    a. Nuc study 7/16: no scar or ischemia, EF 42 // b. Nuc study 12/17: EF 48, ?small apical ischemia, Low Risk   HIV (human immunodeficiency virus infection) ([place])    HLD (hyperlipidemia)    Hypertension    Hypertensive heart disease with CHF (congestive heart failure) ([place]) 10/09/2016   Hypothyroidism    Mitral regurgitation    Neuropathy    OSA (obstructive sleep apnea)    Post-operative nausea and vomiting    1 time as a child, no problems as an adult   Sleep apnea    uses c-pap   Thyroid disease    Wears glasses    Wears partial dentures     Past Surgical History:  Procedure Laterality Date   AV FISTULA PLACEMENT Left 07/02/2018   Procedure: Creation of Left arm BRACHIOBASILIC ARTERIOVENOUS  FISTULA;  Surgeon: Marty Heck, MD;  Location: [place];  Service: Vascular;  Laterality: Left;   [place] Left 08/27/2018   Procedure: Left arm BRACHIOBASILIC VEIN TRANSPOSITION SECOND STAGE;  Surgeon: Marty Heck, MD;  Location: [place];  Service: Vascular;  Laterality: Left;   BUBBLE STUDY  09/03/2020   Procedure: BUBBLE STUDY;  Surgeon: Fay Records, MD;  Location: [place];  Service: Cardiovascular;;   BUBBLE STUDY  06/03/2022   Procedure: BUBBLE STUDY;  Surgeon: Elouise Munroe, MD;  Location: [place];  Service: Cardiology;;   CAPD INSERTION N/A 05/30/2021   Procedure: LAPAROSCOPIC INSERTION CONTINUOUS AMBULATORY PERITONEAL DIALYSIS  (CAPD) CATHETER;  Surgeon: [name], MD;  Location: [place];  Service: Vascular;  Laterality: N/A;   CAPD REMOVAL  N/A 02/13/2022   Procedure: PERITONEAL DIALYSIS CATHETER REMOVAL;  Surgeon: [name], MD;  [facility];  Service: Vascular;  Laterality: N/A;   CHOLECYSTECTOMY     COLONOSCOPY W/ BIOPSIES AND POLYPECTOMY     GIVENS CAPSULE STUDY N/A 03/01/2018   Procedure: GIVENS CAPSULE STUDY;  Surgeon: Jerene Bears, MD;  Location: [place];  Service: Gastroenterology;  Laterality: N/A;   HERNIA REPAIR     As baby   MITRAL VALVE REPAIR N/A 08/21/2022   Procedure: MINIMALLY INVASIVE MITRAL VALVE REPAIR USING RING ANNULOPLASTY SIMULUS 27m;  Surgeon: WCoralie Common MD;  [place]  Service: Open Heart Surgery;  Laterality: N/A;   MULTIPLE TOOTH EXTRACTIONS     NEPHRECTOMY Left 02/18/2021   NOSE SURGERY     RENAL BIOPSY     RIGHT/LEFT HEART CATH AND CORONARY ANGIOGRAPHY N/A 09/04/2021   Procedure: RIGHT/LEFT HEART CATH AND CORONARY ANGIOGRAPHY;  Surgeon: MBurnell Blanks MD;  [place];  Service: Cardiovascular;  Laterality: N/A;   RIGHT/LEFT HEART CATH AND CORONARY ANGIOGRAPHY N/A 08/01/2022   Procedure: RIGHT/LEFT HEART CATH AND CORONARY ANGIOGRAPHY;  Surgeon: CSherren Mocha MD;  [place];  Service: Cardiovascular;  Laterality: N/A;   TEE WITHOUT CARDIOVERSION N/A 09/03/2020   Procedure: TRANSESOPHAGEAL ECHOCARDIOGRAM (TEE);  Surgeon: RFay Records MD;  [place]  Service: Cardiovascular;  Laterality: N/A;   TEE WITHOUT CARDIOVERSION N/A 06/03/2022   Procedure: TRANSESOPHAGEAL ECHOCARDIOGRAM (TEE);  Surgeon: AElouise Munroe MD;  [place]  Service: Cardiology;  Laterality: N/A;   TEE WITHOUT CARDIOVERSION N/A 08/21/2022   Procedure: TRANSESOPHAGEAL ECHOCARDIOGRAM (TEE);  Surgeon: WCoralie Common MD;  [place] [place] [facility]  Service: Open Heart Surgery;  Laterality: N/A;   UPPER GI ENDOSCOPY  04/05/2021    Family Hx:  Family History  Problem Relation Age of Onset   Hypertension Mother    Heart failure Mother     Hypertension Father    Alcohol abuse Father    Hypertension Sister    Multiple sclerosis Sister    Hypertension Sister    Hypertension Brother    Heart attack Maternal Grandmother 53   Scoliosis Other    Allergic rhinitis Neg Hx    Angioedema Neg Hx    Asthma Neg Hx    Eczema Neg Hx    Immunodeficiency Neg Hx    Urticaria Neg Hx    Colon cancer Neg Hx    Pancreatic cancer Neg Hx    Esophageal cancer Neg Hx     Social History:  reports that he quit smoking about 23 years ago. His smoking use included cigarettes. He has a 18.00 pack-year smoking history. He has never used smokeless tobacco. He reports that  he does not currently use alcohol. He reports that he does not use drugs.  Allergies:  Allergies  Allergen Reactions   Ace Inhibitors Cough and Other (See Comments)    Medications: Prior to Admission medications   Medication Sig Start Date End Date Taking? Authorizing Provider  ACETAMINOPHEN EXTRA STRENGTH 500 MG tablet Take 1,000 mg by mouth every 6 (six) hours as needed (pain.). 02/20/21  Yes [provider]  allopurinol (ZYLOPRIM) 100 MG tablet TAKE 1 TABLET(100 MG) BY MOUTH TWICE DAILY FOR 60 DOSES 10/03/21  Yes [name], FNP  amLODipine (NORVASC) 10 MG tablet Take 1 tablet (10 mg total) by mouth daily. Patient taking differently: Take 10 mg by mouth daily after lunch. 10/31/16  Yes [name], FNP  aspirin EC 81 MG tablet Take 1 tablet (81 mg total) by mouth daily. Swallow whole. 03/06/21  Yes Weaver, Scott T, PA-C  azelastine (ASTELIN) 0.1 % nasal spray Place 2 sprays into both nostrils 2 (two) times daily as needed for allergies. 05/13/22  Yes Valentina Shaggy, MD  B Complex-C-Folic Acid (DIALYVITE PO) Take 1 tablet by mouth daily.   Yes [provider]  BIKTARVY 50-200-25 MG TABS tablet TAKE ONE TABLET BY MOUTH EVERY DAY 06/26/22  Yes Campbell Riches, MD  budesonide-formoterol [facility]) 160-4.5 MCG/ACT inhaler Inhale 2 puffs into the  lungs daily. 08/06/22  Yes Valentina Shaggy, MD  desloratadine (CLARINEX) 5 MG tablet Take 1 tablet (5 mg total) by mouth daily. Patient taking differently: Take 5 mg by mouth every evening. 05/13/22 07/28/24 Yes Valentina Shaggy, MD  famotidine (PEPCID) 20 MG tablet Take 1 tablet (20 mg total) by mouth 2 (two) times daily. Patient taking differently: Take 20 mg by mouth 2 (two) times daily as needed for indigestion or heartburn. 02/12/22  Yes [name], MD  ferric citrate (AURYXIA) 1 GM 210 MG(Fe) tablet Take 420 mg by mouth with breakfast, with lunch, and with evening meal.   Yes [provider]  gabapentin (NEURONTIN) 300 MG capsule TAKE 1 CAPSULE(300 MG) BY MOUTH THREE TIMES DAILY Patient taking differently: Take 300 mg by mouth 3 (three) times daily as needed (pain/neuropathy). 10/09/20  Yes [name], FNP  hydrALAZINE (APRESOLINE) 100 MG tablet Take 100 mg by mouth 3 (three) times daily.   Yes [provider]  ipratropium (ATROVENT) 0.06 % nasal spray USE 2 SPRAYS IN EACH NOSTRIL 2 TO 3 TIMES DAILY AS NEEDED 05/13/22  Yes Valentina Shaggy, MD  lactulose [facility]) 10 GM/15ML solution Take 20 g by mouth daily as needed for moderate constipation.  07/08/20  Yes [provider]  levalbuterol (XOPENEX HFA) 45 MCG/ACT inhaler Inhale 2 puffs into the lungs every 4 (four) hours as needed for wheezing. 06/05/22  Yes Valentina Shaggy, MD  levothyroxine (SYNTHROID) 25 MCG tablet Take 25 mcg by mouth daily before breakfast. 03/31/22  Yes [provider]  lubiprostone (AMITIZA) 24 MCG capsule Take 1 capsule (24 mcg total) by mouth 2 (two) times daily with a meal. NEEDS OFFICE VISIT FOR FURTHER REFILLS 03/03/22  Yes Pyrtle, Lajuan Lines, MD  Methoxy PEG-Epoetin Beta (MIRCERA IJ) Inject 1 each into the skin every 30 (thirty) days. Administered at dialysis center once a month 11/08/20  Yes [provider]  metoprolol tartrate (LOPRESSOR) 25 MG  tablet Take 12.5 mg by mouth 2 (two) times daily.   Yes [provider]  Olopatadine HCl (PATADAY) 0.7 % SOLN Place 1 drop into both eyes  2 (two) times daily as needed (allergies).   Yes [provider]  pantoprazole (PROTONIX) 40 MG tablet Take 1 tablet (40 mg total) by mouth daily. **PLEASE CONTACT THE OFFICE TO SCHEDULE FOLLOW UP Patient taking differently: Take 40 mg by mouth at bedtime. 03/27/22  Yes Esterwood, Amy S, PA-C  rOPINIRole (REQUIP) 1 MG tablet TAKE ONE TABLET BY MOUTH AT BEDTIME 01/15/22  Yes Olalere, Adewale A, MD  rosuvastatin (CRESTOR) 10 MG tablet TAKE ONE TABLET BY MOUTH DAILY 12/13/21  Yes [name], MD  SENSIPAR 60 MG tablet Take 60 mg by mouth daily with supper. 06/25/22  Yes [provider]  Spacer/Aero-Holding Chambers DEVI 1 Device by Does not apply route 2 (two) times a day. 03/01/19  Yes Bobbitt, Sedalia Muta, MD  Vitamin D, Ergocalciferol, (DRISDOL) 1.25 MG (50000 UNIT) CAPS capsule Take 50,000 Units by mouth every Tuesday.   Yes [provider]    I have reviewed the patient's current and reported prior to admission medications.  Labs:     Latest Ref Rng & Units 08/22/2022    4:33 PM 08/22/2022    4:59 AM 08/22/2022    4:49 AM  BMP  Glucose 70 - 99 mg/dL 99   102   BUN 6 - 20 mg/dL 50   44   Creatinine 0.61 - 1.24 mg/dL 10.56   9.31   Sodium 135 - 145 mmol/L 135  134  135   Potassium 3.5 - 5.1 mmol/L 4.5  4.2  4.2   Chloride 98 - 111 mmol/L 99   100   CO2 22 - 32 mmol/L 19   19   Calcium 8.9 - 10.3 mg/dL 7.6   7.9     ROS:  Pertinent items noted in HPI and remainder of comprehensive ROS otherwise negative.  Physical Exam: Vitals:   08/22/22 1440 08/22/22 1445  BP:    Pulse: 81 84  Resp: 17 19  Temp: 98.8 F (37.1 C) 98.6 F (37 C)  SpO2: 96% 95%     General: adult male in bed in NAD  HEENT: NCAT Eyes: EOMI sclera anicteric Neck: supple trachea midline  Heart: S1S2  Lungs: clear but reduced on  auscultation  Abdomen: soft/nt/obese habitus  Extremities: 1+ edema lower extremities; no cyanosis or clubbing Skin: no rash on extremities exposed  Neuro: alert and oriented x 3 provides hx and follows commands Psych normal mood and affect Access Left IJ tunn catheter; left AVF with bruit and thrill   Outpatient HD orders:  [place]  MWF 4 hours BF 400/DF 800  3K/2.5 calcium AVF EDW 85 kg Last post weight 93.6 kg on 08/20/22 Meds:  Sensipar 30 mg three times a week with HD Venofer 50 mg weekly Mircera 150 mcg every 2 weeks (last dose 08/15/22) Calcitriol 1.75 mcg three times a week with HD  Assessment/Plan:  # ESRD - overloaded and on pressors  - start CRRT and continue through the weekend to optimize his cardiopulmonary status  - Spoke with pulmonary/critical care and they are placing a nontunneled catheter for CRRT - appreciate their assistance - CRRT - 4K fluids with net negative 50 to 100 ml/hr as tolerated   # Severe mitral regurgitation  - s/p right mini thoroctomy and mitral valve right annuloplasty on 08/21/22.   # Acute Stroke  - Neurology following - blood pressure goals per neurology and critical care  # Chronic diastolic CHF  - optimize cardiopulmonary status with CRRT   # HTN  -  hx of such  - now on pressors post-operatively   # Hepatitis B - Noted  - Dialysis in isolation room once transitioned to iHD  # Anemia CKD - note ESA recently given outpatient   # Metabolic bone disease  - will pause sensipar with hypocalcemia and reassess - resume calcitriol 1.75 mcg three times a week   Disposition - in ICU - continue CRRT through the weekend    Claudia Desanctis 08/22/2022, 7:24 PM

## 2022-08-22 NOTE — Discharge Instructions (Signed)
Discharge Instructions:  1. You may shower, please wash incisions daily with soap and water and keep dry.  If you wish to cover wounds with dressing you may do so but please keep clean and change daily.  No tub baths or swimming until incisions have completely healed.  If your incisions become red or develop any drainage please call our office at [phone]  2. No Driving until cleared by Dr. Nicholos Johns office and you are no longer using narcotic pain medications  3. Monitor your weight daily.. Please use the same scale and weigh at same time... If you gain 5-10 lbs in 48 hours with associated lower extremity swelling, please contact our office at [phone]  4. Fever of 101.5 for at least 24 hours with no source, please contact our office at [phone]  5. Activity- up as tolerated, please walk at least 3 times per day.  Avoid strenuous activity, no lifting, pushing, or pulling with your arms over 8-10 lbs for a minimum of 6 weeks  6. If any questions or concerns arise, please do not hesitate to contact our office at [phone]

## 2022-08-22 NOTE — TOC Initial Note (Signed)
Transition of Care [facility]) - Initial/Assessment Note    Patient Details  Name: Albert Eaton MRN: [id number] Date of Birth: 02/05/1966  Transition of Care [facility]) CM/SW Contact:    Bethena Roys, RN Phone Number: 08/22/2022, 11:55 AM  Clinical Narrative:  Risk for readmission assessment completed. Patient was discussed in morning rounds- POD-1 MVR, extubated this morning. PTA patient was independent from home alone. Patient  states he has some family support. Case Manager will continue to follow for transition of care needs as the patient progresses.          Expected Discharge Plan: Home/Self Care Barriers to Discharge: Continued Medical Work up   Patient Goals and CMS Choice Patient states their goals for this hospitalization and ongoing recovery are:: to return home   Choice offered to / list presented to : NA  Expected Discharge Plan and Services Expected Discharge Plan: Home/Self Care In-house Referral: NA Discharge Planning Services: CM Consult Post Acute Care Choice: NA Living arrangements for the past 2 months: Apartment                   DME Agency: NA    Prior Living Arrangements/Services Living arrangements for the past 2 months: Apartment Lives with:: Self Patient language and need for interpreter reviewed:: Yes Do you feel safe going back to the place where you live?: Yes      Need for Family Participation in Patient Care: No (Comment) Care giver support system in place?: No (comment)   Criminal Activity/Legal Involvement Pertinent to Current Situation/Hospitalization: No - Comment as needed  Activities of Daily Living Home Assistive Devices/Equipment: None ADL Screening (condition at time of admission) Patient's cognitive ability adequate to safely complete daily activities?: Yes Is the patient deaf or have difficulty hearing?: No Does the patient have difficulty seeing, even when wearing glasses/contacts?: No Does the patient have difficulty  concentrating, remembering, or making decisions?: No Patient able to express need for assistance with ADLs?: Yes Does the patient have difficulty dressing or bathing?: No Independently performs ADLs?: Yes (appropriate for developmental age) Does the patient have difficulty walking or climbing stairs?: No Weakness of Legs: None Weakness of Arms/Hands: None  Permission Sought/Granted Permission sought to share information with : Family Supports, Case Manager   Emotional Assessment Appearance:: Appears stated age Attitude/Demeanor/Rapport: Engaged Affect (typically observed): Appropriate Orientation: : Oriented to Self, Oriented to Place, Oriented to  Time, Oriented to Situation Alcohol / Substance Use: Not Applicable Psych Involvement: No (comment)  Admission diagnosis:  S/P MVR (mitral valve repair) [id number] Patient Active Problem List   Diagnosis Date Noted   Cerebrovascular accident (CVA) due to embolism of left cerebellar artery ([place])    S/P MVR (mitral valve repair) 08/21/2022   Chronic pain of right ankle 06/26/2022   Autonomic neuropathy in diseases classified elsewhere 04/21/2022   Atrial fibrillation with RVR ([place]) 12/02/2021   Laryngopharyngeal reflux (LPR) 11/11/2021   Chest pain of uncertain etiology    Elevated troponin    NSTEMI (non-ST elevated myocardial infarction) ([place]) 09/03/2021   Essential hypertension 09/03/2021   Renal cell carcinoma ([place]) 06/27/2021   Generalized (acute) peritonitis ([place]) 06/24/2021   Other disorders of bilirubin metabolism 06/24/2021   Renal mass 02/18/2021   Pain, unspecified 02/08/2021   SVT (supraventricular tachycardia) 01/18/2021   Lower abdominal pain 06/26/2020   Contact with and (suspected) exposure to tuberculosis 04/02/2020   Fluid overload, unspecified 04/02/2020   Pulsatile tinnitus of right ear 11/21/2019   Kidney failure 11/21/2019  Acute diastolic CHF (congestive heart failure) ([place]) 11/21/2019   Chills (without  fever) 11/07/2019   Hypercalcemia 10/17/2019   Dependence on renal dialysis ([place]) 06/06/2019   Coagulation defect, unspecified ([place]) 05/23/2019   Iron deficiency anemia, unspecified 05/16/2019   Anemia in other chronic diseases classified elsewhere 05/13/2019   Arteriovenous fistula, acquired ([place]) 05/13/2019   Body mass index (BMI) 28.0-28.9, adult 05/13/2019   Crohn's disease of small intestine without complications ([place]) 54/65/6812   Encounter for screening for respiratory tuberculosis 05/13/2019   Gout, unspecified 05/13/2019   Nausea 05/13/2019   Other fatigue 05/13/2019   Secondary hyperparathyroidism of renal origin ([place]) 05/13/2019   ESRD (end stage renal disease) on dialysis ([place]) 05/13/2019   Chronic diastolic (congestive) heart failure ([place]) 05/13/2019   Chronic kidney disease, unspecified 05/13/2019   Obstructive sleep apnea 05/13/2019   Discomfort of right ear 02/18/2019   Hypokalemia 06/13/2018   Abnormal CT scan, small bowel    Perennial allergic rhinitis with a predominant nonallergic component 02/09/2018   Allergic conjunctivitis 02/09/2018   Mild intermittent asthma 02/09/2018   Atherosclerosis 12/09/2017   Hypothyroidism 11/19/2017   Mixed hyperlipidemia 10/13/2017   Personal history of gout 10/13/2017   S/P cholecystectomy 10/13/2017   Congestive heart failure with LV diastolic dysfunction, NYHA class 1 ([place]) 10/13/2017   Hypothyroidism (acquired) 10/13/2017   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/31/2017   SOB (shortness of breath) 06/07/2017   Arthritis, gouty 01/27/2017   Chronic diastolic CHF (congestive heart failure) ([place]) 01/06/2017   OSA (obstructive sleep apnea) 01/06/2017   Abnormal electrocardiogram (ECG) (EKG) 10/31/2016   Chest pain 10/09/2016   HIV disease ([place]) 10/09/2016   Hypertensive heart disease with CHF (congestive heart failure) ([place]) 10/09/2016   Left-sided low back pain without sciatica 05/29/2015   NYHA class 2 heart failure with  preserved ejection fraction ([place]) 05/15/2015   PAH (pulmonary artery hypertension) ([place]) 05/08/2015   Severe mitral regurgitation 05/04/2015   Prolonged Q-T interval on ECG 05/04/2015   PCP:  [name], NP Pharmacy:   [facility], [place] [address] [place] [address] Phone: [phone] Fax: [phone]  [facility] [place], [place] - [address], [address] [place] [address] Phone: [phone] Fax: [phone]  [facility] [address] [place] [address] Phone: [phone] Fax: [place] [address] [place] [address] Phone: [phone] Fax: [phone]  Social Determinants of Health (SDOH) Interventions Food Insecurity Interventions: Intervention Not Indicated Housing Interventions: Intervention Not Indicated Transportation Interventions: Intervention Not Indicated Utilities Interventions: Intervention Not Indicated  Readmission Risk Interventions    08/22/2022   11:54 AM 09/05/2021   11:20 AM 09/04/2020    9:24 AM  Readmission Risk Prevention Plan  Transportation Screening Complete Complete Complete  Medication Review Press photographer) Complete Complete Complete  PCP or Specialist appointment within 3-5 days of discharge  Complete Complete  HRI or Home Care Consult Complete Complete Complete  SW Recovery Care/Counseling Consult Complete Complete Complete  Palliative Care Screening Not Applicable Not Applicable Not [place] Not Applicable Not Applicable Not Applicable

## 2022-08-22 NOTE — Inpatient Diabetes Management (Signed)
Inpatient Diabetes Program Recommendations  AACE/ADA: New Consensus Statement on Inpatient Glycemic Control (2015)  Target Ranges:  Prepandial:   less than 140 mg/dL      Peak postprandial:   less than 180 mg/dL (1-2 hours)      Critically ill patients:  140 - 180 mg/dL   Lab Results  Component Value Date   GLUCAP 73 08/22/2022   HGBA1C 5.6 08/22/2022    Review of Glycemic Control  Latest Reference Range & Units 08/22/22 00:15 08/22/22 02:11 08/22/22 04:54 08/22/22 08:42 08/22/22 11:56 08/22/22 12:17  Glucose-Capillary 70 - 99 mg/dL 113 (H) 111 (H) 116 (H) 89 67 (L) 73   Diabetes history: None Current orders for Inpatient glycemic control:  Novolog 0-24 units Q4 hours  A1c 5.6% on 10/27  Inpatient Diabetes Program Recommendations:    Note: IV insulin transition -  change Novolog Correction scale to "very sensitive" Novolog 0-6 units Q4 hours starting at 151.  Thanks, Tama Headings RN, MSN, BC-ADM Inpatient Diabetes Coordinator Team Pager [phone] (8a-5p)

## 2022-08-22 NOTE — Progress Notes (Signed)
[name]       [place] [address]             [phone]      1 Day Post-Op  Procedure(s) (LRB): MINIMALLY INVASIVE MITRAL VALVE REPAIR USING RING ANNULOPLASTY SIMULUS 70m (N/A) TRANSESOPHAGEAL ECHOCARDIOGRAM (TEE) (N/A)   Total Length of Stay:  LOS: 1 day    SUBJECTIVE: Events of last evening noted Appears fully neurologic intact  Vitals:   08/22/22 0400 08/22/22 0500  BP:    Pulse: 73 73  Resp: 18 18  Temp: 98.8 F (37.1 C) 99 F (37.2 C)  SpO2: 100% 100%    Intake/Output      10/26 0701 10/27 0700 10/27 0701 10/28 0700   I.V. (mL/kg) 3201.9 (31.4)    Blood 591    IV Piggyback 1318.5    Total Intake(mL/kg) 5111.3 (50.1)    Urine (mL/kg/hr) 10 (0)    Chest Tube 470    Total Output 480    Net +4631.3             sodium chloride 20 mL/hr at 08/22/22 0500   sodium chloride     sodium chloride     albumin human 60 mL/hr at 08/22/22 0500    ceFAZolin (ANCEF) IV 2 g (08/22/22 02706   dexmedetomidine (PRECEDEX) IV infusion 0.7 mcg/kg/hr (08/22/22 0500)   insulin Stopped (08/22/22 0215)   lactated ringers     lactated ringers     lactated ringers 20 mL/hr at 08/22/22 0500   nitroGLYCERIN Stopped (08/21/22 1546)   norepinephrine (LEVOPHED) Adult infusion 4 mcg/min (08/22/22 0500)   phenylephrine (NEO-SYNEPHRINE) Adult infusion 30 mcg/min (08/22/22 0500)    CBC    Component Value Date/Time   WBC 9.1 08/22/2022 0449   RBC 3.20 (L) 08/22/2022 0449   HGB 9.9 (L) 08/22/2022 0459   HGB 13.1 07/25/2022 1244   HCT 29.0 (L) 08/22/2022 0459   HCT 39.2 07/25/2022 1244   PLT 166 08/22/2022 0449   PLT 224 07/25/2022 1244   MCV 92.5 08/22/2022 0449   MCV 91 07/25/2022 1244   MCH 31.9 08/22/2022 0449   MCHC 34.5 08/22/2022 0449   RDW 16.2 (H) 08/22/2022 0449   RDW 17.1 (H) 07/25/2022 1244   LYMPHSABS 2.6 02/09/2022 2255   LYMPHSABS 2.2 09/20/2018 0936   MONOABS 0.7 02/09/2022 2255   EOSABS 0.2 02/09/2022 2255   EOSABS 0.1  09/20/2018 0936   BASOSABS 0.0 02/09/2022 2255   BASOSABS 0.0 09/20/2018 0936   CMP     Component Value Date/Time   NA 134 (L) 08/22/2022 0459   NA 141 07/25/2022 1244   K 4.2 08/22/2022 0459   CL 100 08/22/2022 0449   CO2 19 (L) 08/22/2022 0449   GLUCOSE 102 (H) 08/22/2022 0449   BUN 44 (H) 08/22/2022 0449   BUN 18 07/25/2022 1244   CREATININE 9.31 (H) 08/22/2022 0449   CREATININE 9.83 (H) 04/17/2022 0345   CALCIUM 7.9 (L) 08/22/2022 0449   PROT 8.6 (H) 08/19/2022 1204   PROT 8.2 05/01/2021 1055   ALBUMIN 4.5 08/19/2022 1204   ALBUMIN 5.3 (H) 05/01/2021 1055   AST 15 08/19/2022 1204   ALT 17 08/19/2022 1204   ALKPHOS 144 (H) 08/19/2022 1204   BILITOT 0.5 08/19/2022 1204   BILITOT 0.4 05/01/2021 1055   GFRNONAA 6 (L) 08/22/2022 0449   GFRNONAA 17 (L) 08/31/2017 0908   GFRAA 8 (L) 09/28/2020 1012   GFRAA 20 (L) 08/31/2017  0908   ABG    Component Value Date/Time   PHART 7.390 08/22/2022 0459   PCO2ART 32.2 08/22/2022 0459   PO2ART 137 (H) 08/22/2022 0459   HCO3 19.5 (L) 08/22/2022 0459   TCO2 20 (L) 08/22/2022 0459   ACIDBASEDEF 5.0 (H) 08/22/2022 0459   O2SAT 99 08/22/2022 0459   CBG (last 3)  Recent Labs    08/22/22 0015 08/22/22 0211 08/22/22 0454  GLUCAP 113* 111* 116*   EXAM Lungs: clear Card: RR  Ext: warm Neuro: alert, intubated MAEx4  ASSESSMENT: POD #1 SP mini mitral annuloplasty repair Hemodynamics ok on present drips. Will await extubation this am Need to check with renal on plans for dialysis vs crrt Await neuro recommendations but appears fully resolved what ever event was after extubation    Coralie Common, MD @DATE @

## 2022-08-22 NOTE — Progress Notes (Addendum)
STROKE TEAM PROGRESS NOTE   INTERVAL HISTORY Patient seen in his room, no family present at bedside. Extubated this morning 10/27 without noted focal neurologic deficit on exam. Episode of concern yesterday described as patient with diffuse myoclonus and respiratory arrest requiring reintubation.  CT perfusion imaging with concern for small left frontal lobe white matter infarct.  Unable to obtain MRI brain at this time due to pacing wires in place. Will repeat CTH.  EEG is suggestive of severe diffuse encephalopathy but no seizures Vitals:   08/22/22 0325 08/22/22 0400 08/22/22 0500 08/22/22 0510  BP:      Pulse:  73 73 73  Resp:  18 18 18   Temp:  35.7 F (37.1 C) 99 F (37.2 C) 99 F (37.2 C)  TempSrc:      SpO2: 100% 100% 100% 100%  Weight:   102 kg   Height:       CBC:  Recent Labs  Lab 08/21/22 1902 08/21/22 1947 08/22/22 0449 08/22/22 0459  WBC 10.1  --  9.1  --   HGB 10.2*   < > 10.2* 9.9*  HCT 30.6*   < > 29.6* 29.0*  MCV 93.6  --  92.5  --   PLT 184  --  166  --    < > = values in this interval not displayed.   Basic Metabolic Panel:  Recent Labs  Lab 08/21/22 1902 08/21/22 1947 08/22/22 0449 08/22/22 0459  NA 134*   < > 135 134*  K 4.2   < > 4.2 4.2  CL 100  --  100  --   CO2 20*  --  19*  --   GLUCOSE 108*  --  102*  --   BUN 40*  --  44*  --   CREATININE 8.60*  --  9.31*  --   CALCIUM 7.1*  --  7.9*  --   MG 3.1*  --  2.8*  --    < > = values in this interval not displayed.   Lipid Panel: No results for input(s): "CHOL", "TRIG", "HDL", "CHOLHDL", "VLDL", "LDLCALC" in the last 168 hours. HgbA1c:  Recent Labs  Lab 08/19/22 1120  HGBA1C 5.1   Urine Drug Screen: No results for input(s): "LABOPIA", "COCAINSCRNUR", "LABBENZ", "AMPHETMU", "THCU", "LABBARB" in the last 168 hours.  Alcohol Level No results for input(s): "ETH" in the last 168 hours.  IMAGING past 24 hours EEG adult  Result Date: 08/21/2022 Lora Havens, MD     08/21/2022   8:45 PM Patient Name: Arjay Jaskiewicz Branum MRN: [id number] Epilepsy Attending: Lora Havens Referring Physician/Provider: Candee Furbish, MD Date: 08/21/2022 Duration: 21.50 mins Patient history: 56yo M had myoclonus of all 4 ext then became unresponsive and stopped breathing. EEG to evaluate for seizure Level of alertness:  comatose AEDs during EEG study: None Technical aspects: This EEG study was done with scalp electrodes positioned according to the 10-20 International system of electrode placement. Electrical activity was reviewed with band pass filter of 1-70Hz , sensitivity of 7 uV/mm, display speed of 109m/sec with a 60Hz  notched filter applied as appropriate. EEG data were recorded continuously and digitally stored.  Video monitoring was available and reviewed as appropriate. Description: EEG showed continuous generalized mixed frequencies with predominantly 3 to 6 Hz theta-delta slowing admixed with intermittent 8-9Hz  generalized alpha activity and 13-15hz  frontocentral beta activity. Hyperventilation and photic stimulation were not performed.   ABNORMALITY - Continuous slow, generalized IMPRESSION: This study is suggestive of severe  diffuse encephalopathy, nonspecific etiology but could be related to sedation. No seizures or epileptiform discharges were seen throughout the recording. Please note that lack of epileptiform activity on interictal EEG does not exclude the diagnosis of epilepsy. Lora Havens   DG CHEST PORT 1 VIEW  Result Date: 08/21/2022 CLINICAL DATA:  Ventilator dependence. EXAM: PORTABLE CHEST 1 VIEW COMPARISON:  Earlier same day FINDINGS: 1815 hours. Endotracheal tube tip is 4.2 cm above the base of the carina. NG tube tip is superimposed on the heart. The course of the NG tube does not follow the airway anatomy suggesting that it tracks down the esophagus. Appearance may be related to high position of the left hemidiaphragm and is unchanged in the interval. Patient does not have a  hiatal hernia on prior MR of 10/30/2021. Right IJ pulmonary artery catheter tip is in the right main pulmonary artery. Right chest tube noted with no evidence for right-sided pneumothorax. Drain overlying the inferior heart may be pericardial/mediastinal. Vascular congestion has increased in the interval with probable edema and basilar atelectasis evident. IMPRESSION: 1. Slight progression of vascular congestion and interstitial opacity suggesting edema. 2. NG tube tip is superimposed on the heart. The course of the NG tube does not follow the airway anatomy suggesting that it tracks down the esophagus. Positioning of the tip may be related to high left hemidiaphragm and is unchanged in the interval. If there is clinical concern for NG tube malposition, administration of a small amount of contrast material through the tube with repeat x-ray may prove helpful. 3. Endotracheal tube tip is 4.2 cm above the base of the carina. 4. Right chest tube without evidence for right pneumothorax. Electronically Signed   By: Misty Stanley M.D.   On: 08/21/2022 18:42   CT ANGIO HEAD NECK W WO CM W PERF (CODE STROKE)  Result Date: 08/21/2022 CLINICAL DATA:  Mitral valve replacement today. Acute neurologic deficit postop EXAM: CT ANGIOGRAPHY HEAD AND NECK CT PERFUSION BRAIN TECHNIQUE: Multidetector CT imaging of the head and neck was performed using the standard protocol during bolus administration of intravenous contrast. Multiplanar CT image reconstructions and MIPs were obtained to evaluate the vascular anatomy. Carotid stenosis measurements (when applicable) are obtained utilizing NASCET criteria, using the distal internal carotid diameter as the denominator. Multiphase CT imaging of the brain was performed following IV bolus contrast injection. Subsequent parametric perfusion maps were calculated using RAPID software. RADIATION DOSE REDUCTION: This exam was performed according to the departmental dose-optimization program  which includes automated exposure control, adjustment of the mA and/or kV according to patient size and/or use of iterative reconstruction technique. CONTRAST:  182m OMNIPAQUE IOHEXOL 350 MG/ML SOLN COMPARISON:  CT head 08/21/2022 FINDINGS: CTA NECK FINDINGS Aortic arch: Mild atherosclerotic calcification aortic arch. Proximal great vessels widely patent. Right carotid system: Mild atherosclerotic calcification right carotid bulb. Negative for stenosis. Small caliber right internal carotid artery similar to that seen on the left. Left carotid system: Mild atherosclerotic calcification proximal left internal carotid artery without significant stenosis. Small caliber left internal carotid artery similar to the right side. Vertebral arteries: Both vertebral arteries patent to the basilar without stenosis. Skeleton: No acute skeletal abnormality Other neck: The patient is intubated.  NG tube in the esophagus Upper chest: Right chest tube in place. Small right apical pneumothorax. Gas in the right chest wall from recent surgery. Mild atelectasis in the posterior lung apices bilaterally. Review of the MIP images confirms the above findings CTA HEAD FINDINGS Anterior circulation: Atherosclerotic calcification  in the cavernous carotid bilaterally without stenosis. Anterior and middle cerebral arteries patent without stenosis or large vessel occlusion. Negative for aneurysm. Posterior circulation: Both vertebral arteries patent to the basilar without significant stenosis. Mild atherosclerotic calcification distal right vertebral artery without significant stenosis. PICA patent bilaterally. Basilar patent. Mild atherosclerotic calcification in the basilar. Superior cerebellar and posterior cerebral arteries patent bilaterally without stenosis. Venous sinuses: Normal venous enhancement Anatomic variants: None Review of the MIP images confirms the above findings CT Brain Perfusion Findings: ASPECTS: 10 CBF (<30%) Volume: 30m  Perfusion (Tmax>6.0s) volume: 137mMismatch Volume: 8m66mnfarction Location:Left frontal lobe predominantly white matter IMPRESSION: 1. CT perfusion positive for a small area of infarct and ischemia left frontal lobe predominately in the white matter. 2. Negative for large vessel occlusion 3. No significant carotid or vertebral artery stenosis in the neck. 4. Postop changes right chest. Small right pneumothorax. Right chest tube in place. 5. These results were called by telephone at the time of interpretation on 08/21/2022 at 6:38 pm to provider [facility]who verbally acknowledged these results. Electronically Signed   By: ChaFranchot GalloD.   On: 08/21/2022 18:38   CT HEAD WO CONTRAST (5MM)  Result Date: 08/21/2022 CLINICAL DATA:  Seizure, history of mitral valve replacement EXAM: CT HEAD WITHOUT CONTRAST TECHNIQUE: Contiguous axial images were obtained from the base of the skull through the vertex without intravenous contrast. RADIATION DOSE REDUCTION: This exam was performed according to the departmental dose-optimization program which includes automated exposure control, adjustment of the mA and/or kV according to patient size and/or use of iterative reconstruction technique. COMPARISON:  None Available. FINDINGS: Brain: No acute infarct or hemorrhage. Lateral ventricles and midline structures are unremarkable. No acute extra-axial fluid collections. No mass effect. Vascular: Atherosclerosis of the internal carotid arteries. No hyperdense vessel. Skull: Normal. Negative for fracture or focal lesion. Sinuses/Orbits: No acute finding. Other: None. IMPRESSION: 1. No acute intracranial process. Electronically Signed   By: MicRanda NgoD.   On: 08/21/2022 18:12   DG Chest Port 1 View  Result Date: 08/21/2022 CLINICAL DATA:  Status post MVR EXAM: PORTABLE CHEST 1 VIEW COMPARISON:  08/19/2022 FINDINGS: Interval placement of support apparatus including endotracheal tube, esophagogastric tube, right  neck pulmonary vascular catheter, tip directed over the main pulmonary artery, as well as bilateral chest and mediastinal drainage tubes. Cardiomegaly with mitral valve annular prosthesis. Probable small left pleural effusion and associated atelectasis or consolidation. Osseous structures unremarkable. IMPRESSION: 1. Interval placement of support apparatus including endotracheal tube, esophagogastric tube, right neck pulmonary vascular catheter as well as bilateral chest and mediastinal drainage tubes. 2. Cardiomegaly with mitral valve annular prosthesis. 3. Probable small left pleural effusion and associated atelectasis or consolidation. Electronically Signed   By: AleDelanna AhmadiD.   On: 08/21/2022 13:11    PHYSICAL EXAM  Physical Exam  Constitutional: Appears well-developed and well-nourished patient in the ICU with ongoing monitoring  Psych: Affect appropriate to situation, calm and cooperative  Eyes: No scleral injection HENT: No OP obstrucion MSK: no joint deformities or swelling Cardiovascular: Normal rate and regular rhythm on telemetry Respiratory: Effort normal, non-labored breathing on 3L [place].  GI: Soft.  No distension. There is no tenderness.  Skin: right chest wall incisions with 2 drains  Neuro: Mental Status: Patient is awake but prefers to keep eyes closed during exam. He is alert, oriented to person, place, month, year, and situation. Speech is fluent without dysarthria or aphasia.  Follows commands without difficulty.  There is no evidence of neglect.  Cranial Nerves: II: Visual Fields are full. Pupils are equal, round, and reactive to light.  III,IV, VI: EOMI without ptosis or diploplia.  V: Facial sensation is symmetric to light touch  VII: Facial movement is symmetric resting and with movement VIII: Hearing is intact to voice X: Palate elevates symmetrically XI: Shoulder shrug is symmetric XII: Tongue protrudes midline without atrophy or fasciculations.   Motor: Tone is normal. Bulk is normal. 5/5 strength was present in all four extremities without asymmetry. No vertical drift.  Sensory: Sensation is symmetric to light touch in the arms and legs. No extinction to DSS present.  Cerebellar: FNF and HKS are intact bilaterally  ASSESSMENT/PLAN Mr. Frenchie Dangerfield Oyama is a 56 y.o. male with history of ESRD on HD, prior RCC s/p nephrectomy, chronic diastolic CHF, HLD, HTN, HIV, asthma, OSA presenting with symptomatic mitral valve insufficiency s/p mitral valve ring annuloplasty on 10/26. Patient was extubated on 10/26 and within approximately 5 minutes, he had generalized myoclonus and respiratory arrest requiring re-intubation and a Code Stroke was subsequently activated.   Stroke:  left frontal infarct likely cardioembolic in nature secondary to recent cardiac procedure 08/21/22.  Event of full body myoclonus and arrested respirations is felt unlikely to be related to infarction identified on CT perfusion imaging and more likely related to metabolic derangements versus hypercapnia s/p extubation with associated respiratory arrest.  CT head No acute abnormality CTA head & neck without significant carotid or vertebral artery stenosis in the neck. Negative for LVO. CT perfusion small area of infarct and ischemia left frontal lobe predominately in the white matter. Repeat CT head 10/27 pending MRI  pending, possibly tomorrow, pacing wires remain in place this morning  Carotid Doppler extracranial vessels near-normal with only minimal wall thickening or plaque  EEG 10/26: Suggestive of severe diffuse encephalopathy of nonspecific etiology, possibly related to sedation. No seizures or epileptiform discharges noted.  2D Echo pending LDL 57 HgbA1c 5.1 VTE prophylaxis - SCDs    There are no active orders of the following types: Diet, Nourishments.   aspirin 81 mg daily prior to admission, now on aspirin 81 mg daily.  Therapy recommendations:   pending Disposition:  pending  Hypertension Home meds:  Norvasc 10 mg  Stable On pressors per CCM/cardiology s/p cardiac procedure Permissive hypertension (OK if < 220/120) but gradually normalize in 5-7 days Long-term BP goal normotensive  Hyperlipidemia Home meds:  None LDL 57, goal < 70 High intensity statin not indicated as LDL is stable not on home medications   Other Stroke Risk Factors History of cigarette smoking, stopped 23 years ago Obesity, Body mass index is 32.27 kg/m., BMI >/= 30 associated with increased stroke risk, recommend weight loss, diet and exercise as appropriate  Obstructive sleep apnea, unable to tolerate CPAP at home Congestive heart failure  Other Active Problems HIV on ART  RCC s/p nephrectomy ESRD on HD Symptomatic mitral valve insufficiency s/p mini thoracotomy and mitral valve ring annuloplasty 10/26  Hospital day # 1  Anibal [place], AGACNP-BC [facility] Pager: [phone]   STROKE MD NOTE :  I have personally obtained history,examined this patient, reviewed notes, independently viewed imaging studies, participated in medical decision making and plan of care.ROS completed by me personally and pertinent positives fully documented  I have made any additions or clarifications directly to the above note. Agree with note above.  Patient had mini thoracotomy with mitral valve ring annuloplasty yesterday and was initially extubated  and doing fine and then had an episode of generalized myoclonus and respiratory failure requiring intubation.  CT perfusion showed small left frontal perfusion defect the patient did not have any clinical lateralizing signs to suggest ischemia.  EEG suggestive of severe diffuse encephalopathy but no seizures.  At present his exam is nonfocal except mild encephalopathy and is to be improving.  MRI likely cannot be obtained until he has temporary pacing wires in his chest.  Recommend repeat CT scan of the head  today.  Continue aspirin for stroke prevention for now and aggressive risk factor modification.  Long discussion with patient and care team and answered questions. This patient is critically ill and at significant risk of neurological worsening, death and care requires constant monitoring of vital signs, hemodynamics,respiratory and cardiac monitoring, extensive review of multiple databases, frequent neurological assessment, discussion with family, other specialists and medical decision making of high complexity.I have made any additions or clarifications directly to the above note.This critical care time does not reflect procedure time, or teaching time or supervisory time of PA/NP/Med Resident etc but could involve care discussion time.  I spent 30 minutes of neurocritical care time  in the care of  this patient.      Antony Contras, MD Medical Director [facility] Pager: [phone] 08/22/2022 1:28 PM  To contact Stroke Continuity provider, please refer to [email or web address] After hours, contact General Neurology

## 2022-08-22 NOTE — Progress Notes (Signed)
Echocardiogram 2D Echocardiogram has been performed.  Albert Eaton 08/22/2022, 9:42 AM

## 2022-08-22 NOTE — Progress Notes (Signed)
PHARMACY NOTE:  ANTIMICROBIAL RENAL DOSAGE ADJUSTMENT  Current antimicrobial regimen includes a mismatch between antimicrobial dosage and estimated renal function.  As per policy approved by the Pharmacy & Therapeutics and Medical Executive Committees, the antimicrobial dosage will be adjusted accordingly.  Current antimicrobial dosage:  ancef 2gm IV q8h x6 doses  Indication: surgical prophylaxis  Renal Function:  Estimated Creatinine Clearance: 10.6 mL/min (A) (by C-G formula based on SCr of 9.31 mg/dL (H)). [x]      On intermittent HD, scheduled     Antimicrobial dosage has been changed to:  No further coverage needed due to ESRD  Additional comments:   Thank you for allowing pharmacy to be a part of this patient's care.  Hildred Laser, PharmD Clinical Pharmacist **Pharmacist phone directory can now be found on [email or web address] ([email or web address]).  Listed under [place].

## 2022-08-23 ENCOUNTER — Inpatient Hospital Stay (HOSPITAL_COMMUNITY): Payer: Medicare Other

## 2022-08-23 DIAGNOSIS — Z9889 Other specified postprocedural states: Secondary | ICD-10-CM | POA: Diagnosis not present

## 2022-08-23 LAB — GLUCOSE, CAPILLARY
Glucose-Capillary: 108 mg/dL — ABNORMAL HIGH (ref 70–99)
Glucose-Capillary: 116 mg/dL — ABNORMAL HIGH (ref 70–99)
Glucose-Capillary: 128 mg/dL — ABNORMAL HIGH (ref 70–99)
Glucose-Capillary: 144 mg/dL — ABNORMAL HIGH (ref 70–99)
Glucose-Capillary: 73 mg/dL (ref 70–99)
Glucose-Capillary: 73 mg/dL (ref 70–99)

## 2022-08-23 LAB — RENAL FUNCTION PANEL
Albumin: 2.7 g/dL — ABNORMAL LOW (ref 3.5–5.0)
Albumin: 2.9 g/dL — ABNORMAL LOW (ref 3.5–5.0)
Anion gap: 13 (ref 5–15)
Anion gap: 11 (ref 5–15)
BUN: 40 mg/dL — ABNORMAL HIGH (ref 6–20)
BUN: 31 mg/dL — ABNORMAL HIGH (ref 6–20)
CO2: 20 mmol/L — ABNORMAL LOW (ref 22–32)
CO2: 25 mmol/L (ref 22–32)
Calcium: 7.2 mg/dL — ABNORMAL LOW (ref 8.9–10.3)
Calcium: 7.6 mg/dL — ABNORMAL LOW (ref 8.9–10.3)
Chloride: 100 mmol/L (ref 98–111)
Chloride: 101 mmol/L (ref 98–111)
Creatinine, Ser: 8.12 mg/dL — ABNORMAL HIGH (ref 0.61–1.24)
Creatinine, Ser: 5.97 mg/dL — ABNORMAL HIGH (ref 0.61–1.24)
GFR, Estimated: 7 mL/min — ABNORMAL LOW (ref >60)
GFR, Estimated: 10 mL/min — ABNORMAL LOW (ref >60)
Glucose, Bld: 94 mg/dL (ref 70–99)
Glucose, Bld: 79 mg/dL (ref 70–99)
Phosphorus: 5.4 mg/dL — ABNORMAL HIGH (ref 2.5–4.6)
Phosphorus: 3.7 mg/dL (ref 2.5–4.6)
Potassium: 4.1 mmol/L (ref 3.5–5.1)
Potassium: 4.2 mmol/L (ref 3.5–5.1)
Sodium: 133 mmol/L — ABNORMAL LOW (ref 135–145)
Sodium: 137 mmol/L (ref 135–145)

## 2022-08-23 LAB — CBC
HCT: 25.9 % — ABNORMAL LOW (ref 39.0–52.0)
Hemoglobin: 8.6 g/dL — ABNORMAL LOW (ref 13.0–17.0)
MCH: 31.4 pg (ref 26.0–34.0)
MCHC: 33.2 g/dL (ref 30.0–36.0)
MCV: 94.5 fL (ref 80.0–100.0)
Platelets: 104 10*3/uL — ABNORMAL LOW (ref 150–400)
RBC: 2.74 MIL/uL — ABNORMAL LOW (ref 4.22–5.81)
RDW: 16.6 % — ABNORMAL HIGH (ref 11.5–15.5)
WBC: 8.7 10*3/uL (ref 4.0–10.5)
nRBC: 0.3 % — ABNORMAL HIGH (ref 0.0–0.2)

## 2022-08-23 LAB — MAGNESIUM: Magnesium: 2.6 mg/dL — ABNORMAL HIGH (ref 1.7–2.4)

## 2022-08-23 NOTE — Progress Notes (Signed)
     [address]       [place] [address]             [phone]       EVENING ROUNDS  1600 cc negative on CVVH Feels good Will DC cts

## 2022-08-23 NOTE — Progress Notes (Signed)
[facility] Progress Note  Name: Albert Eaton MRN: [id number] DOB: [birth year]  Chief Complaint:  Came in for valve repair  Subjective:  Started on CRRT after nontunneled catheter placement with critical care on 10/27 pm.  He has been at net neg 50-100 ml/hr as tolerated and had 1 liter UF over 10/27 reporting period.  Started CRRT about 9 pm after head CT.  CT head overnight - no acute intracranial process and no new hypodensity to suggest evolving infarct.  He has been on room air.  Phenylephrine is down to 5 mcg/min.    Review of systems:  Shortness of breath is a little better Denies n/v/d Chest discomfort post-op Denies cramping or dizziness ------------------ Background on consult:  Albert Eaton is a 56 y.o. male with a history of ESRD, chronic diastolic CHF, HTN, HIV, OSA, severe mitral regurgitation, and hepatitis B who presented to the hospital for planned mitral valve repair.  He had elected to have surgery as he had been experiencing dyspnea on exertion.  He last dialyzed on 10/25 and left quite a bit above his EDW as below (EDW 85 kg and last post weight 93.6 kg on 08/20/22).  He underwent right mini thoroctomy and mitral valve right annuloplasty on 08/21/22.  Note post-op he had an episode of diffuse myoclonus and respiratory arrest requiring reintubation.  Had a left frontal infarct noted on imaging which was felt to be cardioembolic event secondary to recent cardiac procedure.  Then had event of full body myoclonus felt related to metabolic derangements and hypercapnia.  Neurology has been following.  He has been on levo and neo today.  Levo is off and neo is at 20 mcg/min.  He was extubated earlier today.  He feels really overloaded and states that his blood pressure was dropping during his last outpatient treatment.  He had recently asked them to raise his EDW by 2 kg to 87 kg.  Nephrology is consulted for assistance with management of ESRD.  He has been seen by  critical care and they are able to place a tunneled catheter for CRRT.  His nephew is now at bedside - he states that this is his power of attorney.  Patient does confirm that he has hepatitis and dialyzes in the isolation room at [place].      Intake/Output Summary (Last 24 hours) at 08/23/2022 7989 Last data filed at 08/23/2022 0500 Gross per 24 hour  Intake 1360.06 ml  Output 1460 ml  Net -99.94 ml    Vitals:  Vitals:   08/23/22 0515 08/23/22 0530 08/23/22 0545 08/23/22 0600  BP:    111/70  Pulse: 78 80 83 84  Resp: 16 18 19  (!) 25  Temp: (!) 97.2 F (36.2 C) (!) 97.3 F (36.3 C) (!) 97.3 F (36.3 C) (!) 97.3 F (36.3 C)  TempSrc:      SpO2: 94% 94% 93% 92%  Weight:      Height:         Physical Exam:  General: adult male in bed in NAD  HEENT: NCAT Eyes: EOMI sclera anicteric Neck: supple trachea midline  Heart: S1S2  Lungs: clear but reduced on auscultation; unlabored and on room air Abdomen: soft/nt/obese habitus  Extremities: trace to 1+ edema lower extremities; no cyanosis or clubbing Skin: no rash on extremities exposed  Neuro: alert and oriented x 3 provides hx and follows commands Psych normal mood and affect Access Left IJ nontunneled catheter; left AVF with bruit and thrill  Medications reviewed   Labs:     Latest Ref Rng & Units 08/23/2022    3:35 AM 08/22/2022    4:33 PM 08/22/2022    4:59 AM  BMP  Glucose 70 - 99 mg/dL 94  99    BUN 6 - 20 mg/dL 40  50    Creatinine 0.61 - 1.24 mg/dL 8.12  10.56    Sodium 135 - 145 mmol/L 133  135  134   Potassium 3.5 - 5.1 mmol/L 4.1  4.5  4.2   Chloride 98 - 111 mmol/L 100  99    CO2 22 - 32 mmol/L 20  19    Calcium 8.9 - 10.3 mg/dL 7.2  7.6      Outpatient HD orders:  [place]  MWF 4 hours BF 400/DF 800  3K/2.5 calcium AVF EDW 85 kg Last post weight 93.6 kg on 08/20/22 Meds:  Sensipar 30 mg three times a week with HD Venofer 50 mg weekly Mircera 150 mcg every 2 weeks (last dose  08/15/22) Calcitriol 1.75 mcg three times a week with HD   Assessment/Plan:   # ESRD - typically MWF HD outpatient  - overloaded and on pressors s/p mitral valve repair.  S/p nontunneled catheter with critical care on 10/27 - Continue CRRT through the weekend to optimize his cardiopulmonary status  - 4K fluids with net negative 100 to 150 ml/hr as tolerated     # Severe mitral regurgitation  - s/p right mini thoroctomy and mitral valve right annuloplasty on 08/21/22.    # Acute Stroke  - Neurology following - CT head on 10/27 thankfully without acute process or new hypodensity - blood pressure goals per neurology and critical care   # Chronic diastolic CHF  - optimize cardiopulmonary status with CRRT    # HTN  - hx of such  - now on pressors post-operatively but requirement improving    # Hepatitis B - Noted  - Dialysis in isolation room once transitioned to iHD   # Anemia CKD - note ESA recently given outpatient as well as setting of recent CVA - holding ESA here for now   # Metabolic bone disease  - paused sensipar with hypocalcemia and will reassess - resumed calcitriol 1.75 mcg three times a week  - stop auryxia as on CRRT   Disposition - in ICU - continue CRRT through the weekend   Claudia Desanctis, MD 08/23/2022 6:39 AM

## 2022-08-23 NOTE — Progress Notes (Signed)
NAME:  Albert Eaton, MRN:  [id number], DOB:  1965/11/17, LOS: 2 ADMISSION DATE:  08/21/2022, CONSULTATION DATE:  08/21/22 REFERRING MD:  Lavonna Monarch, CHIEF COMPLAINT:  SOB   History of Present Illness:  56 year old man with hx of ESRD on HD, prior RCC s/p nephrectomy, HIV, asthma, sleep apnea who presents with symptomatic mitral insufficiency and underwent mini thoracotomy today with mitral valve ring annuloplasty with bypass support (82 min crossclamp, 130 min total).  He arrives back to the unit on pressors, vent and PCCM to assist with management.  Pertinent  Medical History   Past Medical History:  Diagnosis Date   Anemia    Aortic atherosclerosis (HCC)    Arthritis    Asthma    Cancer ([place])    renal cyst   Chronic diastolic CHF (congestive heart failure) ([place]) 01/06/2017   Echo 7/16 [facility] in [place], [name]) Mild AI, mild LAE, mild concentric LVH, EF 55, normal wall motion, mild to moderate MR, mild PI, RVSP 55 // Echo 10/09/16 ([facility]):  Moderate LVH, grade 2 diastolic dysfunction, mild MR, moderate LAE    CKD (chronic kidney disease)    Dialysis Mon Wed Fri   Esophagitis    GERD (gastroesophageal reflux disease)    Gout    no current problems   Heart murmur    never caused any problems   Hepatitis    Hep B   History of nuclear stress test    a. Nuc study 7/16: no scar or ischemia, EF 42 // b. Nuc study 12/17: EF 48, ?small apical ischemia, Low Risk   HIV (human immunodeficiency virus infection) ([place])    HLD (hyperlipidemia)    Hypertension    Hypertensive heart disease with CHF (congestive heart failure) ([place]) 10/09/2016   Hypothyroidism    Mitral regurgitation    Neuropathy    OSA (obstructive sleep apnea)    Post-operative nausea and vomiting    1 time as a child, no problems as an adult   Sleep apnea    uses c-pap   Thyroid disease    Wears glasses    Wears partial dentures      Significant Hospital Events: Including procedures, antibiotic start and  stop dates in addition to other pertinent events   08/21/22 Right Mini thoroctomy Extracorporeal circulation 3.   Mitral valve ring annuloplasty using a 30 mm Simulus semi-rigid annuloplasty band (SN [id number]  Extubation followed by seizure-like episode, apnea, emergent reintubation, code stroke>> complete L frontal infarct  10/27 extubated, started on CRRT  Interim History / Subjective:  Sitting in chair getting CRRT, feeling better  Objective   Blood pressure 126/75, pulse 80, temperature (!) 97.5 F (36.4 C), resp. rate (!) 21, height 5' 10"  (1.778 m), weight 98 kg, SpO2 95 %. PAP: (31-64)/(9-32) 53/21 CO:  [6.1 L/min-9 L/min] 6.1 L/min CI:  [2.9 L/min/m2-4.2 L/min/m2] 2.9 L/min/m2      Intake/Output Summary (Last 24 hours) at 08/23/2022 0850 Last data filed at 08/23/2022 0800 Gross per 24 hour  Intake 1047.73 ml  Output 1554 ml  Net -506.27 ml    Filed Weights   08/21/22 0609 08/22/22 0500 08/23/22 0500  Weight: 93.4 kg 102 kg 98 kg    Examination: No distress sitting in chair Mild anasarca is improving Chest tube small serosanguinous output Moves all 4 ext to command Aox3 Good strength  Labs improved with CRRT Plts down slightly CXR some haziness at R base  Resolved Hospital Problem list  N/A  Assessment & Plan:  S/P mitral valve annuloplasty with CPB and mini thoracotomy L frontal stroke- no obvious deficits at present ?seizure activity Hx ESRD with intermittent issues with vasoplegia HIV- CD4 694 04/17/22, undetectable load, on ART HTN, HLD, Hypothyroidism, OSA, Asthma  - Repeat extubation trial today if does okay on SBT - Drain, CRRT pull management per primary - GDMT and further stroke workup per neurology: likely OP - SSI - Brovana/pulmicort - Continue ART, synthroid  Best Practice (right click and "Reselect all SmartList Selections" daily)  Per primary  Will follow while in ICU  Erskine Emery MD PCCM

## 2022-08-23 NOTE — Progress Notes (Signed)
STROKE TEAM PROGRESS NOTE   INTERVAL HISTORY No one at bedside.  Pt awake, dialysis running, having lunch.  Doing well. Normal neuro exam.  Primary cancelled MRI, watch and wait for now.      Vitals:   08/23/22 0615 08/23/22 0630 08/23/22 0645 08/23/22 0700  BP:    104/69  Pulse: 77 77 77 75  Resp: 13 13 13 14   Temp: (!) 97.3 F (36.3 C) (!) 97.3 F (36.3 C) (!) 97.2 F (36.2 C) (!) 97.2 F (36.2 C)  TempSrc:      SpO2: 93% 94% 91% 94%  Weight:      Height:       CBC:  Recent Labs  Lab 08/22/22 1633 08/23/22 0335  WBC 10.4 8.7  HGB 8.9* 8.6*  HCT 26.9* 25.9*  MCV 94.4 94.5  PLT 126* 104*    Basic Metabolic Panel:  Recent Labs  Lab 08/22/22 1633 08/23/22 0335  NA 135 133*  K 4.5 4.1  CL 99 100  CO2 19* 20*  GLUCOSE 99 94  BUN 50* 40*  CREATININE 10.56* 8.12*  CALCIUM 7.6* 7.2*  MG 2.8* 2.6*  PHOS  --  5.4*    Lipid Panel: No results for input(s): "CHOL", "TRIG", "HDL", "CHOLHDL", "VLDL", "LDLCALC" in the last 168 hours. HgbA1c:  Recent Labs  Lab 08/22/22 0901  HGBA1C 5.6    Urine Drug Screen: No results for input(s): "LABOPIA", "COCAINSCRNUR", "LABBENZ", "AMPHETMU", "THCU", "LABBARB" in the last 168 hours.  Alcohol Level No results for input(s): "ETH" in the last 168 hours.  IMAGING past 24 hours CT HEAD WO CONTRAST (5MM)  Result Date: 08/22/2022 CLINICAL DATA:  Stroke follow-up EXAM: CT HEAD WITHOUT CONTRAST TECHNIQUE: Contiguous axial images were obtained from the base of the skull through the vertex without intravenous contrast. RADIATION DOSE REDUCTION: This exam was performed according to the departmental dose-optimization program which includes automated exposure control, adjustment of the mA and/or kV according to patient size and/or use of iterative reconstruction technique. COMPARISON:  08/21/2022 head CT and CTA FINDINGS: Brain: No new hypodensity to suggest evolving infarct, with particular attention paid to the anterior left frontal lobe  area identified on the CT perfusion. No evidence of acute infarction, hemorrhage, mass, mass effect, or midline shift. No hydrocephalus or extra-axial fluid collection. Vascular: No hyperdense vessel. Atherosclerotic calcifications in the intracranial carotid and vertebral arteries. Skull: Normal. Negative for fracture or focal lesion. Sinuses/Orbits: No acute finding. Other: The mastoid air cells are well aerated. IMPRESSION: No acute intracranial process. No new hypodensity to suggest evolving infarct. Electronically Signed   By: Merilyn Baba M.D.   On: 08/22/2022 22:27   DG CHEST PORT 1 VIEW  Result Date: 08/22/2022 CLINICAL DATA:  Central line placement. EXAM: PORTABLE CHEST 1 VIEW COMPARISON:  Chest x-ray 08/22/2022 FINDINGS: Endotracheal tube has been removed. Right-sided chest tube is again seen. Swan-Ganz catheter tip at the level of the main pulmonary artery. Left-sided central venous catheter tip in the distal SVC. Cardiomediastinal silhouette is enlarged unchanged. There is no focal lung infiltrate, pleural effusion or pneumothorax. No acute fractures are seen. IMPRESSION: 1. Endotracheal tube has been removed. 2. Swan-Ganz catheter tip at the level of the main pulmonary artery. Electronically Signed   By: Ronney Asters M.D.   On: 08/22/2022 18:51   ECHOCARDIOGRAM COMPLETE BUBBLE STUDY  Result Date: 08/22/2022    ECHOCARDIOGRAM REPORT   Patient Name:   Albert Eaton [facility] Date of Exam: 08/22/2022 Medical Rec #:  [id number]  Height:       70.0 in Accession #:    [id number]      Weight:       224.9 lb Date of Birth:  [birth year]        BSA:          2.194 m Patient Age:    56 years        BP:           72/53 mmHg Patient Gender: M               HR:           81 bpm. Exam Location:  Inpatient Procedure: 2D Echo, Cardiac Doppler, Color Doppler and Saline Contrast Bubble            Study Indications:    Stroke 434.91 / I63.9  History:        Patient has prior history of Echocardiogram examinations,  most                 recent 08/21/2022. CHF; Risk Factors:Sleep Apnea, Hypertension,                 Dyslipidemia and GERD.                  Mitral Valve: prosthetic annuloplasty ring valve is present in                 the mitral position.  Sonographer:    Bernadene Person RDCS Referring Phys: [id number] [place]  1. Post MV repair.  2. Left ventricular ejection fraction, by estimation, is 45 to 50%. The left ventricle has mildly decreased function. The left ventricle demonstrates global hypokinesis. There is mild left ventricular hypertrophy. Left ventricular diastolic parameters are consistent with Grade II diastolic dysfunction (pseudonormalization). There is the interventricular septum is flattened in systole and diastole, consistent with right ventricular pressure and volume overload.  3. Right ventricular systolic function is mildly reduced. The right ventricular size is moderately enlarged. There is moderately elevated pulmonary artery systolic pressure. The estimated right ventricular systolic pressure is 26.3 mmHg.  4. Left atrial size was severely dilated.  5. Right atrial size was severely dilated.  6. The mitral valve has been repaired/replaced. Trivial mitral valve regurgitation. No evidence of mitral stenosis. Moderate mitral annular calcification. There is a prosthetic annuloplasty ring present in the mitral position.  7. Tricuspid valve regurgitation is severe.  8. The aortic valve is normal in structure. Aortic valve regurgitation is not visualized. No aortic stenosis is present.  9. The inferior vena cava is dilated in size with <50% respiratory variability, suggesting right atrial pressure of 15 mmHg. Conclusion(s)/Recommendation(s): No intracardiac source of embolism detected on this transthoracic study. Consider a transesophageal echocardiogram to exclude cardiac source of embolism if clinically indicated. FINDINGS  Left Ventricle: Left ventricular ejection fraction, by  estimation, is 45 to 50%. The left ventricle has mildly decreased function. The left ventricle demonstrates global hypokinesis. The left ventricular internal cavity size was normal in size. There is  mild left ventricular hypertrophy. The interventricular septum is flattened in systole and diastole, consistent with right ventricular pressure and volume overload. Left ventricular diastolic parameters are consistent with Grade II diastolic dysfunction  (pseudonormalization). Right Ventricle: The right ventricular size is moderately enlarged. No increase in right ventricular wall thickness. Right ventricular systolic function is mildly reduced. There is moderately elevated pulmonary artery systolic pressure. The tricuspid regurgitant velocity is 2.96 m/s, and with  an assumed right atrial pressure of 15 mmHg, the estimated right ventricular systolic pressure is 62.1 mmHg. Left Atrium: Left atrial size was severely dilated. Right Atrium: Right atrial size was severely dilated. Pericardium: There is no evidence of pericardial effusion. Mitral Valve: The mitral valve has been repaired/replaced. Moderate mitral annular calcification. Trivial mitral valve regurgitation. There is a prosthetic annuloplasty ring present in the mitral position. No evidence of mitral valve stenosis. Tricuspid Valve: The tricuspid valve is normal in structure. Tricuspid valve regurgitation is severe. No evidence of tricuspid stenosis. Aortic Valve: The aortic valve is normal in structure. Aortic valve regurgitation is not visualized. No aortic stenosis is present. Pulmonic Valve: The pulmonic valve was normal in structure. Pulmonic valve regurgitation is mild. No evidence of pulmonic stenosis. Aorta: The aortic root is normal in size and structure. Venous: The inferior vena cava is dilated in size with less than 50% respiratory variability, suggesting right atrial pressure of 15 mmHg. IAS/Shunts: No atrial level shunt detected by color flow  Doppler. Agitated saline contrast was given intravenously to evaluate for intracardiac shunting. Additional Comments: Post MV repair. A device lead is visualized in the right ventricle.  LEFT VENTRICLE PLAX 2D LVIDd:         6.00 cm      Diastology LVIDs:         4.70 cm      LV e' medial:    6.60 cm/s LV PW:         1.20 cm      LV E/e' medial:  20.3 LV IVS:        1.20 cm      LV e' lateral:   11.20 cm/s LVOT diam:     2.10 cm      LV E/e' lateral: 12.0 LV SV:         71 LV SV Index:   33 LVOT Area:     3.46 cm  LV Volumes (MOD) LV vol d, MOD A2C: 118.0 ml LV vol d, MOD A4C: 154.0 ml LV vol s, MOD A2C: 52.1 ml LV vol s, MOD A4C: 50.5 ml LV SV MOD A2C:     65.9 ml LV SV MOD A4C:     154.0 ml LV SV MOD BP:      86.7 ml RIGHT VENTRICLE RV S prime:     11.00 cm/s TAPSE (M-mode): 1.5 cm LEFT ATRIUM             Index        RIGHT ATRIUM           Index LA diam:        4.80 cm 2.19 cm/m   RA Area:     26.50 cm LA Vol (A2C):   84.5 ml 38.52 ml/m  RA Volume:   86.90 ml  39.61 ml/m LA Vol (A4C):   75.7 ml 34.50 ml/m LA Biplane Vol: 81.1 ml 36.97 ml/m  AORTIC VALVE             PULMONIC VALVE LVOT Vmax:   123.00 cm/s PR End Diast Vel: 7.40 msec LVOT Vmean:  77.400 cm/s LVOT VTI:    0.206 m  AORTA Ao Root diam: 4.00 cm Ao Asc diam:  3.90 cm MITRAL VALVE                TRICUSPID VALVE MV Area (PHT): 3.65 cm     TR Peak grad:   35.0 mmHg MV Decel Time: 208 msec  TR Vmax:        296.00 cm/s MV E velocity: 134.00 cm/s MV A velocity: 73.20 cm/s   SHUNTS MV E/A ratio:  1.83         Systemic VTI:  0.21 m                             Systemic Diam: 2.10 cm Candee Furbish MD Electronically signed by Candee Furbish MD Signature Date/Time: 08/22/2022/2:00:56 PM    Final     PHYSICAL EXAM  Physical Exam  Constitutional: Appears well-developed and well-nourished patient in the ICU with ongoing monitoring  Psych: Affect appropriate to situation, calm and cooperative  Eyes: No scleral injection HENT: No OP obstrucion MSK: no  joint deformities or swelling Cardiovascular: Normal rate and regular rhythm on telemetry Respiratory: Effort normal, non-labored breathing RA.  GI: Soft.  No distension. There is no tenderness.  Skin: right chest wall incisions with 2 drains  Neuro: Mental Status: Patient is awake and alert, oriented to person, place, month, year, and situation. Speech is fluent without dysarthria or aphasia.  Follows commands without difficulty.  There is no evidence of neglect.  Cranial Nerves: II: Visual Fields are full. Pupils are equal, round, and reactive to light.  III,IV, VI: EOMI without ptosis or diploplia.  V: Facial sensation is symmetric to light touch  VII: Facial movement is symmetric resting and with movement VIII: Hearing is intact to voice X: Palate elevates symmetrically XI: Shoulder shrug is symmetric XII: Tongue protrudes midline without atrophy or fasciculations.  Motor: Tone is normal. Bulk is normal. 5/5 strength was present in all four extremities without asymmetry. No vertical drift.  Sensory: Sensation is symmetric to light touch in the arms and legs. No extinction to DSS present.  Cerebellar: FNF and HKS are intact bilaterally  ASSESSMENT/PLAN Albert Eaton is a 56 y.o. male with history of ESRD on HD, prior RCC s/p nephrectomy, chronic diastolic CHF, HLD, HTN, HIV, asthma, OSA presenting with symptomatic mitral valve insufficiency s/p mitral valve ring annuloplasty on 10/26. Patient was extubated on 10/26 and within approximately 5 minutes, he had generalized myoclonus and respiratory arrest requiring re-intubation and a Code Stroke was subsequently activated.   Stroke:  left frontal infarct likely cardioembolic in nature secondary to recent cardiac procedure 08/21/22.  Event of full body myoclonus and arrested respirations is felt unlikely to be related to infarction identified on CT perfusion imaging and more likely related to metabolic derangements versus  hypercapnia s/p extubation with associated respiratory arrest.   CT head No acute abnormality CTA head & neck without significant carotid or vertebral artery stenosis in the neck. Negative for LVO. CT perfusion small area of infarct and ischemia left frontal lobe predominately in the white matter. Repeat CT head 10/27 stable  MRI cancelled, while on CRRT risky, priamry didn't think it was appropriate.  Can order next week if we need it.  Carotid Doppler extracranial vessels near-normal with only minimal wall thickening or plaque   EEG 10/26: Suggestive of severe diffuse encephalopathy of nonspecific etiology, possibly related to sedation. No seizures or epileptiform discharges noted.   2D Echo post MVP repair, LV EF 45-50%, Grade II diastolic dysfunction.  Severe dilation of L and R atria, severe tricuspid regurg  LDL 57 HgbA1c 5.1 VTE prophylaxis - SCDs    Diet   Diet Heart Room service appropriate? Yes; Fluid consistency: Thin  aspirin 81 mg daily prior  to admission, now on aspirin 81 mg daily. Continue ASA at discharge. Therapy recommendations:  pending Disposition:  pending  Hypertension Home meds:  Norvasc 10 mg  Stable On pressors Neo and Nitro per CCM/cardiology s/p cardiac procedure Permissive hypertension (OK if < 220/120) but gradually normalize in 5-7 days Long-term BP goal normotensive  Hyperlipidemia Home meds:  None LDL 57, goal < 70 High intensity statin not indicated as LDL is stable not on home medications   Other Stroke Risk Factors History of cigarette smoking, stopped 23 years ago Obesity, Body mass index is 31 kg/m., BMI >/= 30 associated with increased stroke risk, recommend weight loss, diet and exercise as appropriate  Obstructive sleep apnea, unable to tolerate CPAP at home Congestive heart failure  Other Active Problems HIV on ART  RCC s/p nephrectomy ESRD on HD Symptomatic mitral valve insufficiency s/p mini thoracotomy and mitral valve ring  annuloplasty 10/26  Hospital day # 2    Patient seen and examined by NP with MD. MD to update note as needed.   Dellia Beckwith, AGNP Neurology Hospitalist  ATTENDING ATTESTATION:  Dr. Reeves Forth evaluated pt independently, reviewed imaging, chart, labs. Discussed and formulated plan with the Resident/APP. Changes were made to the note where appropriate. Please see APP/resident note above for details.     56 year old doing well today no deficits NIH stroke scale of 0.  Primary canceled MRI.  They will reconsult neuro when needed.  His confusion has cleared up.  No further recommendation at this time.  Neurology will sign off please call with questions.  Kemora Pinard,MD   To contact Stroke Continuity provider, please refer to [email or web address] After hours, contact General Neurology

## 2022-08-23 NOTE — Progress Notes (Signed)
[id number]       [place] [id number]             [phone]      2 Days Post-Op  Procedure(s) (LRB): MINIMALLY INVASIVE MITRAL VALVE REPAIR USING RING ANNULOPLASTY SIMULUS 33m (N/A) TRANSESOPHAGEAL ECHOCARDIOGRAM (TEE) (N/A)   Total Length of Stay:  LOS: 2 days    SUBJECTIVE: Started on CVVH last night Feels less bloated No SOB No significant CP Vitals:   08/23/22 0645 08/23/22 0700  BP:  104/69  Pulse: 77 75  Resp: 13 14  Temp: (!) 97.2 F (36.2 C) (!) 97.2 F (36.2 C)  SpO2: 91% 94%    Intake/Output      10/27 0701 10/28 0700 10/28 0701 10/29 0700   I.V. (mL/kg) 1112.3 (11.4)    Blood     IV Piggyback 12.9    Total Intake(mL/kg) 1125.3 (11.5)    Urine (mL/kg/hr)     Chest Tube 580    CRRT 1074    Total Output 1654    Net -528.7              prismasol BGK 4/2.5 400 mL/hr at 08/22/22 2106    prismasol BGK 4/2.5 400 mL/hr at 08/22/22 2106   sodium chloride 20 mL/hr at 08/23/22 0700   sodium chloride     sodium chloride 20 mL/hr at 08/23/22 0700   dexmedetomidine (PRECEDEX) IV infusion Stopped (08/22/22 0803)   lactated ringers     lactated ringers 20 mL/hr at 08/22/22 1242   nitroGLYCERIN Stopped (08/21/22 1546)   norepinephrine (LEVOPHED) Adult infusion Stopped (08/22/22 1133)   phenylephrine (NEO-SYNEPHRINE) Adult infusion Stopped (08/22/22 2129)   prismasol BGK 4/2.5 1,800 mL/hr at 08/23/22 0245    CBC    Component Value Date/Time   WBC 8.7 08/23/2022 0335   RBC 2.74 (L) 08/23/2022 0335   HGB 8.6 (L) 08/23/2022 0335   HGB 13.1 07/25/2022 1244   HCT 25.9 (L) 08/23/2022 0335   HCT 39.2 07/25/2022 1244   PLT 104 (L) 08/23/2022 0335   PLT 224 07/25/2022 1244   MCV 94.5 08/23/2022 0335   MCV 91 07/25/2022 1244   MCH 31.4 08/23/2022 0335   MCHC 33.2 08/23/2022 0335   RDW 16.6 (H) 08/23/2022 0335   RDW 17.1 (H) 07/25/2022 1244   LYMPHSABS 2.6 02/09/2022 2255   LYMPHSABS 2.2 09/20/2018 0936   MONOABS 0.7  02/09/2022 2255   EOSABS 0.2 02/09/2022 2255   EOSABS 0.1 09/20/2018 0936   BASOSABS 0.0 02/09/2022 2255   BASOSABS 0.0 09/20/2018 0936   CMP     Component Value Date/Time   NA 133 (L) 08/23/2022 0335   NA 141 07/25/2022 1244   K 4.1 08/23/2022 0335   CL 100 08/23/2022 0335   CO2 20 (L) 08/23/2022 0335   GLUCOSE 94 08/23/2022 0335   BUN 40 (H) 08/23/2022 0335   BUN 18 07/25/2022 1244   CREATININE 8.12 (H) 08/23/2022 0335   CREATININE 9.83 (H) 04/17/2022 0345   CALCIUM 7.2 (L) 08/23/2022 0335   PROT 8.6 (H) 08/19/2022 1204   PROT 8.2 05/01/2021 1055   ALBUMIN 2.7 (L) 08/23/2022 0335   ALBUMIN 5.3 (H) 05/01/2021 1055   AST 15 08/19/2022 1204   ALT 17 08/19/2022 1204   ALKPHOS 144 (H) 08/19/2022 1204   BILITOT 0.5 08/19/2022 1204   BILITOT 0.4 05/01/2021 1055   GFRNONAA 7 (L) 08/23/2022 0335   GFRNONAA 17 (L) 08/31/2017 09381  GFRAA 8 (L) 09/28/2020 1012   GFRAA 20 (L) 08/31/2017 0908   ABG    Component Value Date/Time   PHART 7.390 08/22/2022 0459   PCO2ART 32.2 08/22/2022 0459   PO2ART 137 (H) 08/22/2022 0459   HCO3 19.5 (L) 08/22/2022 0459   TCO2 20 (L) 08/22/2022 0459   ACIDBASEDEF 5.0 (H) 08/22/2022 0459   O2SAT 99 08/22/2022 0459   CBG (last 3)  Recent Labs    08/22/22 2028 08/22/22 2339 08/23/22 0336  GLUCAP 117* 105* 108*   EXAM Lungs: overall clear Card: RR no murmur Ext: warm  ASSESSMENT: POD #2 SP mini mitral valve repair Hemodynamics good. Will  dc swan and leave cordis for today Start amiodarone prophylaxis Renal: appreciate renals input and will await their timing for transitioning to conventional dialysis. Getting good fluid removal with CVVH Up to chair. Assess CT output this evening to see if can remove CT Will DC MRI. Will follow up with neuro need as out pt   Coralie Common, MD @DATE @

## 2022-08-24 LAB — RENAL FUNCTION PANEL
Albumin: 3 g/dL — ABNORMAL LOW (ref 3.5–5.0)
Albumin: 3.2 g/dL — ABNORMAL LOW (ref 3.5–5.0)
Anion gap: 12 (ref 5–15)
Anion gap: 9 (ref 5–15)
BUN: 25 mg/dL — ABNORMAL HIGH (ref 6–20)
BUN: 22 mg/dL — ABNORMAL HIGH (ref 6–20)
CO2: 23 mmol/L (ref 22–32)
CO2: 25 mmol/L (ref 22–32)
Calcium: 8 mg/dL — ABNORMAL LOW (ref 8.9–10.3)
Calcium: 8.5 mg/dL — ABNORMAL LOW (ref 8.9–10.3)
Chloride: 103 mmol/L (ref 98–111)
Chloride: 102 mmol/L (ref 98–111)
Creatinine, Ser: 4.7 mg/dL — ABNORMAL HIGH (ref 0.61–1.24)
Creatinine, Ser: 4.02 mg/dL — ABNORMAL HIGH (ref 0.61–1.24)
GFR, Estimated: 14 mL/min — ABNORMAL LOW (ref >60)
GFR, Estimated: 17 mL/min — ABNORMAL LOW
Glucose, Bld: 90 mg/dL (ref 70–99)
Glucose, Bld: 103 mg/dL — ABNORMAL HIGH (ref 70–99)
Phosphorus: 3 mg/dL (ref 2.5–4.6)
Phosphorus: 2.1 mg/dL — ABNORMAL LOW (ref 2.5–4.6)
Potassium: 4 mmol/L (ref 3.5–5.1)
Potassium: 4 mmol/L (ref 3.5–5.1)
Sodium: 138 mmol/L (ref 135–145)
Sodium: 136 mmol/L (ref 135–145)

## 2022-08-24 LAB — GLUCOSE, CAPILLARY
Glucose-Capillary: 92 mg/dL (ref 70–99)
Glucose-Capillary: 84 mg/dL (ref 70–99)
Glucose-Capillary: 115 mg/dL — ABNORMAL HIGH (ref 70–99)
Glucose-Capillary: 122 mg/dL — ABNORMAL HIGH (ref 70–99)

## 2022-08-24 LAB — MAGNESIUM: Magnesium: 2.5 mg/dL — ABNORMAL HIGH (ref 1.7–2.4)

## 2022-08-24 MED ORDER — SODIUM PHOSPHATES 45 MMOLE/15ML IV SOLN
15.0000 mmol | Freq: Once | INTRAVENOUS | Status: AC
  Administered 2022-08-24: 15 mmol via INTRAVENOUS
  Filled 2022-08-24: qty 5

## 2022-08-24 NOTE — Progress Notes (Signed)
[address]       [place] [address]             [phone]      3 Days Post-Op  Procedure(s) (LRB): MINIMALLY INVASIVE MITRAL VALVE REPAIR USING RING ANNULOPLASTY SIMULUS 19m (N/A) TRANSESOPHAGEAL ECHOCARDIOGRAM (TEE) (N/A)   Total Length of Stay:  LOS: 3 days    SUBJECTIVE: More discomfort at incision site Has more PVCs overnight and tachycardia  Didn't sleep much  Vitals:   08/24/22 0500 08/24/22 0600  BP: (!) 146/106 133/77  Pulse: (!) 123 79  Resp: 13 11  Temp:    SpO2: 96% 97%    Intake/Output      10/28 0701 10/29 0700 10/29 0701 10/30 0700   P.O. 1440    I.V. (mL/kg) 30.6 (0.3)    IV Piggyback     Total Intake(mL/kg) 1470.6 (15)    Urine (mL/kg/hr) 0 (0)    Stool 0    Chest Tube 80    CRRT 4600    Total Output 4680    Net -3209.4         Urine Occurrence 1 x    Stool Occurrence 2 x         prismasol BGK 4/2.5 400 mL/hr at 08/23/22 1023    prismasol BGK 4/2.5 400 mL/hr at 08/23/22 1023   sodium chloride Stopped (08/23/22 1023)   sodium chloride     sodium chloride Stopped (08/23/22 0941)   dexmedetomidine (PRECEDEX) IV infusion Stopped (08/22/22 0803)   lactated ringers Stopped (08/23/22 1024)   lactated ringers 20 mL/hr at 08/22/22 1242   nitroGLYCERIN Stopped (08/21/22 1546)   phenylephrine (NEO-SYNEPHRINE) Adult infusion Stopped (08/22/22 2129)   prismasol BGK 4/2.5 1,800 mL/hr at 08/24/22 0501    CBC    Component Value Date/Time   WBC 8.7 08/23/2022 0335   RBC 2.74 (L) 08/23/2022 0335   HGB 8.6 (L) 08/23/2022 0335   HGB 13.1 07/25/2022 1244   HCT 25.9 (L) 08/23/2022 0335   HCT 39.2 07/25/2022 1244   PLT 104 (L) 08/23/2022 0335   PLT 224 07/25/2022 1244   MCV 94.5 08/23/2022 0335   MCV 91 07/25/2022 1244   MCH 31.4 08/23/2022 0335   MCHC 33.2 08/23/2022 0335   RDW 16.6 (H) 08/23/2022 0335   RDW 17.1 (H) 07/25/2022 1244   LYMPHSABS 2.6 02/09/2022 2255   LYMPHSABS 2.2 09/20/2018 0936   MONOABS 0.7  02/09/2022 2255   EOSABS 0.2 02/09/2022 2255   EOSABS 0.1 09/20/2018 0936   BASOSABS 0.0 02/09/2022 2255   BASOSABS 0.0 09/20/2018 0936   CMP     Component Value Date/Time   NA 138 08/24/2022 0440   NA 141 07/25/2022 1244   K 4.0 08/24/2022 0440   CL 103 08/24/2022 0440   CO2 23 08/24/2022 0440   GLUCOSE 90 08/24/2022 0440   BUN 25 (H) 08/24/2022 0440   BUN 18 07/25/2022 1244   CREATININE 4.70 (H) 08/24/2022 0440   CREATININE 9.83 (H) 04/17/2022 0345   CALCIUM 8.0 (L) 08/24/2022 0440   PROT 8.6 (H) 08/19/2022 1204   PROT 8.2 05/01/2021 1055   ALBUMIN 3.0 (L) 08/24/2022 0440   ALBUMIN 5.3 (H) 05/01/2021 1055   AST 15 08/19/2022 1204   ALT 17 08/19/2022 1204   ALKPHOS 144 (H) 08/19/2022 1204   BILITOT 0.5 08/19/2022 1204   BILITOT 0.4 05/01/2021 1055   GFRNONAA 14 (L) 08/24/2022 0440   GFRNONAA 17 (L) 08/31/2017  [place] 8 (L) 09/28/2020 1012   GFRAA 20 (L) 08/31/2017 0908   ABG    Component Value Date/Time   PHART 7.390 08/22/2022 0459   PCO2ART 32.2 08/22/2022 0459   PO2ART 137 (H) 08/22/2022 0459   HCO3 19.5 (L) 08/22/2022 0459   TCO2 20 (L) 08/22/2022 0459   ACIDBASEDEF 5.0 (H) 08/22/2022 0459   O2SAT 99 08/22/2022 0459   CBG (last 3)  Recent Labs    08/23/22 2029 08/23/22 2337 08/24/22 0335  GLUCAP 144* 73 92   EXAM: Lungs: overall clear Card: RR no murmur Ext: warm Incision site: tender but no collections, soft  ASSESSMENT: POD #3 SP mini thorocotomy MV repair Hemodynamics good. Will restart BP meds CVVH as per Renal service. Hopefully back to conventional dialysis tomorrow    Coralie Common, MD @DATE @

## 2022-08-24 NOTE — Progress Notes (Signed)
[facility] Progress Note  Name: Albert Eaton MRN: [id number] DOB: 31-Mar-1966  Chief Complaint:  Came in for valve repair  Subjective:  He has been on CRRT with UF of 4.6 kg over 10/28.  He has been at a goal of net neg 100 to 150 mL/hr.  Feels good.  He is off of pressors and has been on room air.    Review of systems:  Shortness of breath is much better Denies n/v.  Loose stools Chest discomfort post-op  ------------------ Background on consult:  Albert Eaton is a 56 y.o. male with a history of ESRD, chronic diastolic CHF, HTN, HIV, OSA, severe mitral regurgitation, and hepatitis B who presented to the hospital for planned mitral valve repair.  He had elected to have surgery as he had been experiencing dyspnea on exertion.  He last dialyzed on 10/25 and left quite a bit above his EDW as below (EDW 85 kg and last post weight 93.6 kg on 08/20/22).  He underwent right mini thoroctomy and mitral valve right annuloplasty on 08/21/22.  Note post-op he had an episode of diffuse myoclonus and respiratory arrest requiring reintubation.  Had a left frontal infarct noted on imaging which was felt to be cardioembolic event secondary to recent cardiac procedure.  Then had event of full body myoclonus felt related to metabolic derangements and hypercapnia.  Neurology has been following.  He has been on levo and neo today.  Levo is off and neo is at 20 mcg/min.  He was extubated earlier today.  He feels really overloaded and states that his blood pressure was dropping during his last outpatient treatment.  He had recently asked them to raise his EDW by 2 kg to 87 kg.  Nephrology is consulted for assistance with management of ESRD.  He has been seen by critical care and they are able to place a tunneled catheter for CRRT.  His nephew is now at bedside - he states that this is his power of attorney.  Patient does confirm that he has hepatitis and dialyzes in the isolation room at [facility].       Intake/Output Summary (Last 24 hours) at 08/24/2022 6578 Last data filed at 08/24/2022 0600 Gross per 24 hour  Intake 1490.63 ml  Output 4799 ml  Net -3308.37 ml    Vitals:  Vitals:   08/24/22 0300 08/24/22 0400 08/24/22 0500 08/24/22 0600  BP: 135/81 (!) 142/97 (!) 146/106 133/77  Pulse: 75 (!) 120 (!) 123 79  Resp: 10 12 13 11   Temp:      TempSrc:      SpO2: 97% 95% 96% 97%  Weight:   98.1 kg   Height:         Physical Exam:   General: adult male in bed in NAD  HEENT: NCAT Eyes: EOMI sclera anicteric Neck: supple trachea midline  Heart: S1S2  Lungs: clear but reduced on auscultation; unlabored and on room air Abdomen: soft/nt/obese habitus  Extremities: no pitting edema lower extremities; no cyanosis or clubbing Skin: no rash on extremities exposed  Neuro: alert and oriented x 3 provides hx and follows commands Psych normal mood and affect Access Left IJ nontunneled catheter; left AVF with bruit and thrill  Medications reviewed   Labs:     Latest Ref Rng & Units 08/24/2022    4:40 AM 08/23/2022    4:00 PM 08/23/2022    3:35 AM  BMP  Glucose 70 - 99 mg/dL 90  79  94   BUN 6 - 20 mg/dL 25  31  40   Creatinine 0.61 - 1.24 mg/dL 6.96  2.95  2.84   Sodium 135 - 145 mmol/L 138  137  133   Potassium 3.5 - 5.1 mmol/L 4.0  4.2  4.1   Chloride 98 - 111 mmol/L 103  101  100   CO2 22 - 32 mmol/L 23  25  20    Calcium 8.9 - 10.3 mg/dL 8.0  7.6  7.2     Outpatient HD orders:  [facility] [place]  MWF 4 hours BF 400/DF 800  3K/2.5 calcium AVF EDW 85 kg Last post weight 93.6 kg on 08/20/22 Meds:  Sensipar 30 mg three times a week with HD Venofer 50 mg weekly Mircera 150 mcg every 2 weeks (last dose 08/15/22) Calcitriol 1.75 mcg three times a week with HD   Assessment/Plan:   # ESRD - typically MWF HD outpatient.  He was overloaded and on pressors s/p mitral valve repair.  S/p nontunneled catheter with critical care on 10/27 with CRRT initiation for  overload - Continue CRRT through the weekend to optimize his cardiopulmonary status.  Renal to reassess timing of transition to iHD on 10/30.  Perhaps stop on 10/30 and iHD on 10/31 off schedule (see below - Hep B positive)    - 4K fluids with net negative 100 to 150 ml/hr as tolerated     # Severe mitral regurgitation  - s/p right mini thoroctomy and mitral valve right annuloplasty on 08/21/22.    # Acute Stroke  - Neurology following - CT head on 10/27 thankfully without acute process or new hypodensity - blood pressure goals per neurology and critical care   # Chronic diastolic CHF  - optimize cardiopulmonary status with CRRT    # HTN  - hx of such  - now off of pressors post-operatively    # Hepatitis B - Noted  - He will need dialysis in isolation room once transitioned to iHD; may be easier to treat on a predictable schedule on a TTS schedule to the hep B   # Anemia CKD - note ESA recently given outpatient as well as setting of recent CVA - holding ESA here for now   # Metabolic bone disease  - paused sensipar with hypocalcemia and will reassess per trends - resumed calcitriol 1.75 mcg three times a week  - stopped auryxia as on CRRT   Disposition - in ICU - continue CRRT through the weekend   Estanislado Emms, MD 08/24/2022 6:46 AM

## 2022-08-24 NOTE — Progress Notes (Signed)
NAME:  Albert Eaton, MRN:  [id number], DOB:  August 13, 1966, LOS: 3 ADMISSION DATE:  08/21/2022, CONSULTATION DATE:  08/21/22 REFERRING MD:  Lavonna Monarch, CHIEF COMPLAINT:  SOB   History of Present Illness:  56 year old man with hx of ESRD on HD, prior RCC s/p nephrectomy, HIV, asthma, sleep apnea who presents with symptomatic mitral insufficiency and underwent mini thoracotomy today with mitral valve ring annuloplasty with bypass support (82 min crossclamp, 130 min total).  He arrives back to the unit on pressors, vent and PCCM to assist with management.  Pertinent  Medical History   Past Medical History:  Diagnosis Date   Anemia    Aortic atherosclerosis (HCC)    Arthritis    Asthma    Cancer ([place])    renal cyst   Chronic diastolic CHF (congestive heart failure) ([place]) 01/06/2017   Echo 7/16 [facility] in [place], [name]) Mild AI, mild LAE, mild concentric LVH, EF 55, normal wall motion, mild to moderate MR, mild PI, RVSP 55 // Echo 10/09/16 ([facility]):  Moderate LVH, grade 2 diastolic dysfunction, mild MR, moderate LAE    CKD (chronic kidney disease)    Dialysis Mon Wed Fri   Esophagitis    GERD (gastroesophageal reflux disease)    Gout    no current problems   Heart murmur    never caused any problems   Hepatitis    Hep B   History of nuclear stress test    a. Nuc study 7/16: no scar or ischemia, EF 42 // b. Nuc study 12/17: EF 48, ?small apical ischemia, Low Risk   HIV (human immunodeficiency virus infection) ([place])    HLD (hyperlipidemia)    Hypertension    Hypertensive heart disease with CHF (congestive heart failure) ([place]) 10/09/2016   Hypothyroidism    Mitral regurgitation    Neuropathy    OSA (obstructive sleep apnea)    Post-operative nausea and vomiting    1 time as a child, no problems as an adult   Sleep apnea    uses c-pap   Thyroid disease    Wears glasses    Wears partial dentures      Significant Hospital Events: Including procedures, antibiotic start and  stop dates in addition to other pertinent events   08/21/22 Right Mini thoroctomy Extracorporeal circulation 3.   Mitral valve ring annuloplasty using a 30 mm Simulus semi-rigid annuloplasty band (SN [id number]  Extubation followed by seizure-like episode, apnea, emergent reintubation, code stroke>> complete L frontal infarct  10/27 extubated, started on CRRT 10/28>> CRRT  Interim History / Subjective:  Some discomfort at incision sites but otherwise doing okay. Breathing continues to improve with fluid removal.  Objective   Blood pressure 133/82, pulse 80, temperature 98.3 F (36.8 C), resp. rate 13, height 5' 10"  (1.778 m), weight 98.1 kg, SpO2 97 %.        Intake/Output Summary (Last 24 hours) at 08/24/2022 0952 Last data filed at 08/24/2022 0900 Gross per 24 hour  Intake 1206.83 ml  Output 4878 ml  Net -3671.17 ml    Filed Weights   08/22/22 0500 08/23/22 0500 08/24/22 0500  Weight: 102 kg 98 kg 98.1 kg    Examination: No distress sitting in chair Minimal anasarca Moves all 4 ext to command Aox3 Good strength  Patient Lines/Drains/Airways Status     Active Line/Drains/Airways     Name Placement date Placement time Site Days   Peripheral IV 08/21/22 16 G Right Hand 08/21/22  6834  Hand  3   Fistula / Graft Left Forearm Arteriovenous fistula 07/02/18  0850  Forearm  1514   Fistula / Graft Left Upper arm Arteriovenous fistula 08/27/18  1108  Upper arm  1458   Hemodialysis Catheter Left Subclavian 08/23/22  0800  Subclavian  1   Incision (Closed) 02/13/22 Abdomen Other (Comment) 02/13/22  1134  -- 192   Incision (Closed) 08/21/22 Chest Other (Comment) 08/21/22  1057  -- 3   Incision (Closed) 08/21/22 Groin 08/21/22  1115  -- 3            Labs improved with CRRT No CBC No CXR  Resolved Hospital Problem list   N/A  Assessment & Plan:  S/P mitral valve annuloplasty with CPB and mini thoracotomy ?seizure activity, L frontal stroke 10/27- no obvious  deficits at present Hx ESRD with intermittent issues with vasoplegia HIV- CD4 694 04/17/22, undetectable load, on ART HTN, HLD, Hypothyroidism, OSA, Asthma  - Encourage IS - Fluid removal by CRRT, ?start iHD tomorrrow - Continue nebs as ordered - CBC and CXR ordered for AM - OP neuro f/u for stroke - PT/OT  Best Practice (right click and "Reselect all SmartList Selections" daily)  Per primary  Will follow while in ICU  Erskine Emery MD PCCM

## 2022-08-25 ENCOUNTER — Inpatient Hospital Stay (HOSPITAL_COMMUNITY): Payer: Medicare Other

## 2022-08-25 DIAGNOSIS — N186 End stage renal disease: Secondary | ICD-10-CM

## 2022-08-25 DIAGNOSIS — Z992 Dependence on renal dialysis: Secondary | ICD-10-CM

## 2022-08-25 LAB — CBC
HCT: 26.8 % — ABNORMAL LOW (ref 39.0–52.0)
Hemoglobin: 8.9 g/dL — ABNORMAL LOW (ref 13.0–17.0)
MCH: 31.9 pg (ref 26.0–34.0)
MCHC: 33.2 g/dL (ref 30.0–36.0)
MCV: 96.1 fL (ref 80.0–100.0)
Platelets: 118 10*3/uL — ABNORMAL LOW (ref 150–400)
RBC: 2.79 MIL/uL — ABNORMAL LOW (ref 4.22–5.81)
RDW: 16.7 % — ABNORMAL HIGH (ref 11.5–15.5)
WBC: 8.4 10*3/uL (ref 4.0–10.5)
nRBC: 0.2 % (ref 0.0–0.2)

## 2022-08-25 LAB — RENAL FUNCTION PANEL
Albumin: 3 g/dL — ABNORMAL LOW (ref 3.5–5.0)
Anion gap: 9 (ref 5–15)
BUN: 19 mg/dL (ref 6–20)
CO2: 25 mmol/L (ref 22–32)
Calcium: 8.6 mg/dL — ABNORMAL LOW (ref 8.9–10.3)
Chloride: 103 mmol/L (ref 98–111)
Creatinine, Ser: 3.66 mg/dL — ABNORMAL HIGH (ref 0.61–1.24)
GFR, Estimated: 19 mL/min — ABNORMAL LOW (ref >60)
Glucose, Bld: 104 mg/dL — ABNORMAL HIGH (ref 70–99)
Phosphorus: 3.1 mg/dL (ref 2.5–4.6)
Potassium: 3.9 mmol/L (ref 3.5–5.1)
Sodium: 137 mmol/L (ref 135–145)

## 2022-08-25 LAB — MAGNESIUM: Magnesium: 2.4 mg/dL (ref 1.7–2.4)

## 2022-08-25 MED ORDER — AMIODARONE LOAD VIA INFUSION
150.0000 mg | Freq: Once | INTRAVENOUS | Status: AC
  Administered 2022-08-25: 150 mg via INTRAVENOUS
  Filled 2022-08-25: qty 83.34

## 2022-08-25 MED ORDER — AMIODARONE HCL IN DEXTROSE 360-4.14 MG/200ML-% IV SOLN
30.0000 mg/h | INTRAVENOUS | Status: DC
  Administered 2022-08-26 (×2): 30 mg/h via INTRAVENOUS
  Filled 2022-08-25 (×2): qty 200

## 2022-08-25 MED ORDER — AMIODARONE HCL IN DEXTROSE 360-4.14 MG/200ML-% IV SOLN
60.0000 mg/h | INTRAVENOUS | Status: DC
  Administered 2022-08-25 (×2): 60 mg/h via INTRAVENOUS
  Filled 2022-08-25: qty 200

## 2022-08-25 MED ORDER — AMLODIPINE BESYLATE 10 MG PO TABS
10.0000 mg | ORAL_TABLET | Freq: Every day | ORAL | Status: DC
  Administered 2022-08-25 – 2022-08-27 (×3): 10 mg via ORAL
  Filled 2022-08-25 (×3): qty 1

## 2022-08-25 MED ORDER — METOPROLOL TARTRATE 25 MG/10 ML ORAL SUSPENSION
12.5000 mg | Freq: Two times a day (BID) | ORAL | Status: DC
  Filled 2022-08-25: qty 5

## 2022-08-25 MED ORDER — CHLORHEXIDINE GLUCONATE CLOTH 2 % EX PADS
6.0000 | MEDICATED_PAD | Freq: Every day | CUTANEOUS | Status: DC
  Administered 2022-08-26 – 2022-08-27 (×2): 6 via TOPICAL

## 2022-08-25 MED ORDER — METOPROLOL TARTRATE 25 MG PO TABS
25.0000 mg | ORAL_TABLET | Freq: Two times a day (BID) | ORAL | Status: DC
  Administered 2022-08-25 – 2022-08-27 (×5): 25 mg via ORAL
  Filled 2022-08-25 (×5): qty 1

## 2022-08-25 NOTE — Progress Notes (Signed)
EVENING ROUNDS NOTE :     [address]       [place] [address]             [phone]                 4 Days Post-Op Procedure(s) (LRB): MINIMALLY INVASIVE MITRAL VALVE REPAIR USING RING ANNULOPLASTY SIMULUS 52m (N/A) TRANSESOPHAGEAL ECHOCARDIOGRAM (TEE) (N/A)   Total Length of Stay:  LOS: 4 days  Events:   Stable day IHD tomorrow Tachycardic currently, but in sinus.  Increasing BB    BP 120/88   Pulse 92   Temp 98.6 F (37 C) (Oral)   Resp 11   Ht 5' 10"  (1.778 m)   Wt 93.2 kg   SpO2 95%   BMI 29.48 kg/m          sodium chloride Stopped (08/23/22 1023)   sodium chloride     sodium chloride Stopped (08/23/22 0941)   lactated ringers Stopped (08/23/22 1024)   lactated ringers 20 mL/hr at 08/22/22 1242    I/O last 3 completed shifts: In: 1695.8 [P.O.:1440; IV Piggyback:255.8] Out: 7021       Latest Ref Rng & Units 08/25/2022    4:51 AM 08/23/2022    3:35 AM 08/22/2022    4:33 PM  CBC  WBC 4.0 - 10.5 K/uL 8.4  8.7  10.4   Hemoglobin 13.0 - 17.0 g/dL 8.9  8.6  8.9   Hematocrit 39.0 - 52.0 % 26.8  25.9  26.9   Platelets 150 - 400 K/uL 118  104  126        Latest Ref Rng & Units 08/25/2022    4:51 AM 08/24/2022    3:20 PM 08/24/2022    4:40 AM  BMP  Glucose 70 - 99 mg/dL 104  103  90   BUN 6 - 20 mg/dL 19  22  25    Creatinine 0.61 - 1.24 mg/dL 3.66  4.02  4.70   Sodium 135 - 145 mmol/L 137  136  138   Potassium 3.5 - 5.1 mmol/L 3.9  4.0  4.0   Chloride 98 - 111 mmol/L 103  102  103   CO2 22 - 32 mmol/L 25  25  23    Calcium 8.9 - 10.3 mg/dL 8.6  8.5  8.0     ABG    Component Value Date/Time   PHART 7.390 08/22/2022 0459   PCO2ART 32.2 08/22/2022 0459   PO2ART 137 (H) 08/22/2022 0459   HCO3 19.5 (L) 08/22/2022 0459   TCO2 20 (L) 08/22/2022 0459   ACIDBASEDEF 5.0 (H) 08/22/2022 0459   O2SAT 99 08/22/2022 0459       HMelodie Bouillon MD 08/25/2022 4:08 PM

## 2022-08-25 NOTE — Anesthesia Postprocedure Evaluation (Signed)
Anesthesia Post Note  Patient: Albert Eaton  Procedure(s) Performed: MINIMALLY INVASIVE MITRAL VALVE REPAIR USING RING ANNULOPLASTY SIMULUS 72m TRANSESOPHAGEAL ECHOCARDIOGRAM (TEE)     Patient location during evaluation: SICU Anesthesia Type: General Level of consciousness: sedated Pain management: pain level controlled Vital Signs Assessment: post-procedure vital signs reviewed and stable Respiratory status: patient remains intubated per anesthesia plan Cardiovascular status: stable Postop Assessment: no apparent nausea or vomiting Anesthetic complications: no   No notable events documented.  Last Vitals:  Vitals:   08/25/22 0800 08/25/22 0834  BP:    Pulse:    Resp:    Temp: 37.1 C   SpO2:  100%    Last Pain:  Vitals:   08/25/22 0800  TempSrc: Oral  PainSc:                  [id number]

## 2022-08-25 NOTE — Progress Notes (Signed)
NAME:  Albert Eaton, MRN:  [id number], DOB:  04-26-1966, LOS: 4 ADMISSION DATE:  08/21/2022, CONSULTATION DATE:  08/21/22 REFERRING MD:  Lavonna Monarch, CHIEF COMPLAINT:  SOB   History of Present Illness:  56 year old man with hx of ESRD on HD, prior RCC s/p nephrectomy, HIV, asthma, sleep apnea who presents with symptomatic mitral insufficiency and underwent mini thoracotomy today with mitral valve ring annuloplasty with bypass support (82 min crossclamp, 130 min total).  He arrives back to the unit on pressors, vent and PCCM to assist with management.  Pertinent  Medical History   Past Medical History:  Diagnosis Date   Anemia    Aortic atherosclerosis (HCC)    Arthritis    Asthma    Cancer ([place])    renal cyst   Chronic diastolic CHF (congestive heart failure) ([place]) 01/06/2017   Echo 7/16 [facility] in [place], [name]) Mild AI, mild LAE, mild concentric LVH, EF 55, normal wall motion, mild to moderate MR, mild PI, RVSP 55 // Echo 10/09/16 ([facility]):  Moderate LVH, grade 2 diastolic dysfunction, mild MR, moderate LAE    CKD (chronic kidney disease)    Dialysis Mon Wed Fri   Esophagitis    GERD (gastroesophageal reflux disease)    Gout    no current problems   Heart murmur    never caused any problems   Hepatitis    Hep B   History of nuclear stress test    a. Nuc study 7/16: no scar or ischemia, EF 42 // b. Nuc study 12/17: EF 48, ?small apical ischemia, Low Risk   HIV (human immunodeficiency virus infection) ([place])    HLD (hyperlipidemia)    Hypertension    Hypertensive heart disease with CHF (congestive heart failure) ([place]) 10/09/2016   Hypothyroidism    Mitral regurgitation    Neuropathy    OSA (obstructive sleep apnea)    Post-operative nausea and vomiting    1 time as a child, no problems as an adult   Sleep apnea    uses c-pap   Thyroid disease    Wears glasses    Wears partial dentures      Significant Hospital Events: Including procedures, antibiotic start and  stop dates in addition to other pertinent events   08/21/22 Right Mini thoroctomy Extracorporeal circulation 3.   Mitral valve ring annuloplasty using a 30 mm Simulus semi-rigid annuloplasty band (SN [id number]  Extubation followed by seizure-like episode, apnea, emergent reintubation, code stroke>> complete L frontal infarct  10/27 extubated, started on CRRT 10/28>> CRRT  Interim History / Subjective:  No issues overnight Remains on CRRT  Objective   Blood pressure (!) 146/89, pulse 97, temperature 98.8 F (37.1 C), temperature source Oral, resp. rate 16, height 5' 10"  (1.778 m), weight 97.1 kg, SpO2 100 %.        Intake/Output Summary (Last 24 hours) at 08/25/2022 0925 Last data filed at 08/25/2022 0900 Gross per 24 hour  Intake 1335.75 ml  Output 4338 ml  Net -3002.25 ml   Filed Weights   08/23/22 0500 08/24/22 0500 08/25/22 0500  Weight: 98 kg 98.1 kg 97.1 kg    Examination: Physical exam: General: Middle aged male, lying on the bed HEENT: [place]/AT, eyes anicteric.  moist mucus membranes Neuro: Alert, awake following commands Chest: Coarse breath sounds, no wheezes or rhonchi Heart: Regular rate and rhythm, no murmurs or gallops Abdomen: Soft, nontender, nondistended, bowel sounds present Skin: No rash   Patient Lines/Drains/Airways Status  Active Line/Drains/Airways     Name Placement date Placement time Site Days   Peripheral IV 08/21/22 16 G Right Hand 08/21/22  0635  Hand  3   Fistula / Graft Left Forearm Arteriovenous fistula 07/02/18  0850  Forearm  1514   Fistula / Graft Left Upper arm Arteriovenous fistula 08/27/18  1108  Upper arm  1458   Hemodialysis Catheter Left Subclavian 08/23/22  0800  Subclavian  1   Incision (Closed) 02/13/22 Abdomen Other (Comment) 02/13/22  1134  -- 192   Incision (Closed) 08/21/22 Chest Other (Comment) 08/21/22  1057  -- 3   Incision (Closed) 08/21/22 Groin 08/21/22  1115  -- 3            Labs improved with CRRT No  CBC No CXR  Resolved Hospital Problem list   N/A  Assessment & Plan:  S/P mitral valve annuloplasty with cardiac pulmonary bypass and mini thoracotomy ESRD with intermittent issues with vasoplegia HIV- CD4 694 04/17/22, undetectable load, on ART HTN, HLD, Hypothyroidism, OSA, Asthma Acute stroke/seizures were ruled out  Encourage IS Nephrology is planning to stop CRRT, he will continue with scheduled hemodialysis Continue nebs as ordered Continue HAART Continue pain control with morphine and tramadol Continue metoprolol Continue aspirin and statin  Best Practice (right click and "Reselect all SmartList Selections" daily)      Jacky Kindle, MD [facility] See Amion for pager If no response to pager, please call [phone] until 7pm After 7pm, Please call E-link [phone]

## 2022-08-25 NOTE — Progress Notes (Signed)
[address]       [place] [address]             [phone]      4 Days Post-Op  Procedure(s) (LRB): MINIMALLY INVASIVE MITRAL VALVE REPAIR USING RING ANNULOPLASTY SIMULUS 73m (N/A) TRANSESOPHAGEAL ECHOCARDIOGRAM (TEE) (N/A)   Total Length of Stay:  LOS: 4 days    SUBJECTIVE: Feels well Vitals:   08/25/22 0800 08/25/22 0834  BP:    Pulse:    Resp:    Temp: 98.8 F (37.1 C)   SpO2:  100%    Intake/Output      10/29 0701 10/30 0700 10/30 0701 10/31 0700   P.O. 960    I.V. (mL/kg)     IV Piggyback 255.8    Total Intake(mL/kg) 1215.8 (12.5)    Urine (mL/kg/hr)     Stool 0    Chest Tube     CRRT 4738    Total Output 4738    Net -3522.3         Stool Occurrence 1 x         prismasol BGK 4/2.5 400 mL/hr at 08/25/22 0011    prismasol BGK 4/2.5 400 mL/hr at 08/25/22 0011   sodium chloride Stopped (08/23/22 1023)   sodium chloride     sodium chloride Stopped (08/23/22 0941)   lactated ringers Stopped (08/23/22 1024)   lactated ringers 20 mL/hr at 08/22/22 1242   nitroGLYCERIN Stopped (08/21/22 1546)   prismasol BGK 4/2.5 1,800 mL/hr at 08/25/22 0636    CBC    Component Value Date/Time   WBC 8.4 08/25/2022 0451   RBC 2.79 (L) 08/25/2022 0451   HGB 8.9 (L) 08/25/2022 0451   HGB 13.1 07/25/2022 1244   HCT 26.8 (L) 08/25/2022 0451   HCT 39.2 07/25/2022 1244   PLT 118 (L) 08/25/2022 0451   PLT 224 07/25/2022 1244   MCV 96.1 08/25/2022 0451   MCV 91 07/25/2022 1244   MCH 31.9 08/25/2022 0451   MCHC 33.2 08/25/2022 0451   RDW 16.7 (H) 08/25/2022 0451   RDW 17.1 (H) 07/25/2022 1244   LYMPHSABS 2.6 02/09/2022 2255   LYMPHSABS 2.2 09/20/2018 0936   MONOABS 0.7 02/09/2022 2255   EOSABS 0.2 02/09/2022 2255   EOSABS 0.1 09/20/2018 0936   BASOSABS 0.0 02/09/2022 2255   BASOSABS 0.0 09/20/2018 0936   CMP     Component Value Date/Time   NA 137 08/25/2022 0451   NA 141 07/25/2022 1244   K 3.9 08/25/2022 0451   CL 103 08/25/2022  0451   CO2 25 08/25/2022 0451   GLUCOSE 104 (H) 08/25/2022 0451   BUN 19 08/25/2022 0451   BUN 18 07/25/2022 1244   CREATININE 3.66 (H) 08/25/2022 0451   CREATININE 9.83 (H) 04/17/2022 0345   CALCIUM 8.6 (L) 08/25/2022 0451   PROT 8.6 (H) 08/19/2022 1204   PROT 8.2 05/01/2021 1055   ALBUMIN 3.0 (L) 08/25/2022 0451   ALBUMIN 5.3 (H) 05/01/2021 1055   AST 15 08/19/2022 1204   ALT 17 08/19/2022 1204   ALKPHOS 144 (H) 08/19/2022 1204   BILITOT 0.5 08/19/2022 1204   BILITOT 0.4 05/01/2021 1055   GFRNONAA 19 (L) 08/25/2022 0451   GFRNONAA 17 (L) 08/31/2017 0908   GFRAA 8 (L) 09/28/2020 1012   GFRAA 20 (L) 08/31/2017 0908   ABG    Component Value Date/Time   PHART 7.390 08/22/2022 0459   PCO2ART 32.2 08/22/2022 0459   PO2ART 137 (  H) 08/22/2022 0459   HCO3 19.5 (L) 08/22/2022 0459   TCO2 20 (L) 08/22/2022 0459   ACIDBASEDEF 5.0 (H) 08/22/2022 0459   O2SAT 99 08/22/2022 0459   CBG (last 3)  Recent Labs    08/24/22 0820 08/24/22 1127 08/24/22 1544  GLUCAP 84 115* 122*   Exam Lungs: clear Card: RR no murmur Ext; warm  ASSESSMENT: POD #4 sp Mini thorocotomy MV repair Doing well Awaiting Renal to determine timing of conventional dialysis BP elevated. Restart meds Transfer to floor when off CRRT  Coralie Common, MD @DATE @

## 2022-08-25 NOTE — Progress Notes (Signed)
Pt receives out-pt HD at [facility] on MWF. Will assist as needed.   Hussam Muniz Renal Navigator [phone] 

## 2022-08-26 ENCOUNTER — Other Ambulatory Visit: Payer: Self-pay | Admitting: Student

## 2022-08-26 DIAGNOSIS — Z9889 Other specified postprocedural states: Secondary | ICD-10-CM

## 2022-08-26 DIAGNOSIS — N186 End stage renal disease: Secondary | ICD-10-CM | POA: Diagnosis not present

## 2022-08-26 DIAGNOSIS — Z992 Dependence on renal dialysis: Secondary | ICD-10-CM | POA: Diagnosis not present

## 2022-08-26 DIAGNOSIS — I129 Hypertensive chronic kidney disease with stage 1 through stage 4 chronic kidney disease, or unspecified chronic kidney disease: Secondary | ICD-10-CM | POA: Diagnosis not present

## 2022-08-26 DIAGNOSIS — I340 Nonrheumatic mitral (valve) insufficiency: Secondary | ICD-10-CM

## 2022-08-26 LAB — RENAL FUNCTION PANEL
Albumin: 2.8 g/dL — ABNORMAL LOW (ref 3.5–5.0)
Anion gap: 13 (ref 5–15)
BUN: 34 mg/dL — ABNORMAL HIGH (ref 6–20)
CO2: 23 mmol/L (ref 22–32)
Calcium: 8.8 mg/dL — ABNORMAL LOW (ref 8.9–10.3)
Chloride: 98 mmol/L (ref 98–111)
Creatinine, Ser: 6.35 mg/dL — ABNORMAL HIGH (ref 0.61–1.24)
GFR, Estimated: 10 mL/min — ABNORMAL LOW (ref >60)
Glucose, Bld: 119 mg/dL — ABNORMAL HIGH (ref 70–99)
Phosphorus: 4.7 mg/dL — ABNORMAL HIGH (ref 2.5–4.6)
Potassium: 4 mmol/L (ref 3.5–5.1)
Sodium: 134 mmol/L — ABNORMAL LOW (ref 135–145)

## 2022-08-26 LAB — CBC
HCT: 25.1 % — ABNORMAL LOW (ref 39.0–52.0)
Hemoglobin: 8.5 g/dL — ABNORMAL LOW (ref 13.0–17.0)
MCH: 32.3 pg (ref 26.0–34.0)
MCHC: 33.9 g/dL (ref 30.0–36.0)
MCV: 95.4 fL (ref 80.0–100.0)
Platelets: 128 10*3/uL — ABNORMAL LOW (ref 150–400)
RBC: 2.63 MIL/uL — ABNORMAL LOW (ref 4.22–5.81)
RDW: 16.4 % — ABNORMAL HIGH (ref 11.5–15.5)
WBC: 9 10*3/uL (ref 4.0–10.5)
nRBC: 0 % (ref 0.0–0.2)

## 2022-08-26 LAB — HEPATITIS B SURFACE ANTIGEN: Hepatitis B Surface Ag: REACTIVE — AB

## 2022-08-26 LAB — HEPATITIS B SURFACE ANTIBODY, QUANTITATIVE: Hep B S AB Quant (Post): <3.1 m[IU]/mL — ABNORMAL LOW (ref >9.9)

## 2022-08-26 MED ORDER — AMIODARONE HCL 200 MG PO TABS
200.0000 mg | ORAL_TABLET | Freq: Two times a day (BID) | ORAL | Status: DC
  Administered 2022-08-26 – 2022-08-27 (×3): 200 mg via ORAL
  Filled 2022-08-26 (×3): qty 1

## 2022-08-26 NOTE — Progress Notes (Signed)
  Ordered 6 week post-op Echo following mini MV repair on 08/21/2022 per protocol. Primary Cardiologist: Dr. [name]. CT Surgeon: Dr. Lavonna Monarch.  Darreld Mclean, PA-C 08/26/2022 3:48 PM

## 2022-08-26 NOTE — Progress Notes (Addendum)
[facility] Progress Note   Name: Albert Eaton MRN: [id number] DOB: Mar 07, 1966   Chief Complaint:  Came in for valve repair, had post op complications of resp arrest and seizure-like activity.    Subjective: sitting on the side of the bed, on CRRT, no pressors, no infusions   Review of systems: SOB resolved, no c/o's   ------------------ Background on consult:  Albert Eaton is a 56 y.o. male with a history of ESRD, chronic diastolic CHF, HTN, HIV, OSA, severe mitral regurgitation, and hepatitis B who presented to the hospital for planned mitral valve repair.  He underwent right mini thoroctomy and mitral valve right annuloplasty on 08/21/22.  Note post-op he had an episode of diffuse myoclonus and respiratory arrest requiring reintubation.  Had a left frontal infarct noted on imaging which was felt to be cardioembolic event secondary to recent cardiac procedure.  Neurology followed.  He was extubated  10/29, pressors off now also.         Intake/Output Summary (Last 24 hours) at 08/24/2022 5625 Last data filed at 08/24/2022 0600    Gross per 24 hour  Intake 1490.63 ml  Output 4799 ml  Net -3308.37 ml      Vitals:        Vitals:    08/24/22 0300 08/24/22 0400 08/24/22 0500 08/24/22 0600  BP: 135/81 (!) 142/97 (!) 146/106 133/77  Pulse: 75 (!) 120 (!) 123 79  Resp: 10 12 13 11   Temp:          TempSrc:          SpO2: 97% 95% 96% 97%  Weight:     98.1 kg    Height:              Physical Exam:   General: adult male in bed in NAD  HEENT: NCAT Eyes: EOMI sclera anicteric Neck: supple trachea midline  Heart: S1S2  Lungs: clear but reduced on auscultation Abdomen: soft/nt/obese habitus  Extremities: no pitting edema lower extremities Skin: no rash on extremities exposed  Neuro: alert and oriented x 3 Access temp HD cath L chest + L AVF +bruit    Medications reviewed    Labs:      Latest Ref Rng & Units 08/24/2022    4:40 AM 08/23/2022    4:00  PM 08/23/2022    3:35 AM  BMP  Glucose 70 - 99 mg/dL 90  79  94   BUN 6 - 20 mg/dL 25  31  40   Creatinine 0.61 - 1.24 mg/dL 4.70  5.97  8.12   Sodium 135 - 145 mmol/L 138  137  133   Potassium 3.5 - 5.1 mmol/L 4.0  4.2  4.1   Chloride 98 - 111 mmol/L 103  101  100   CO2 22 - 32 mmol/L 23  25  20    Calcium 8.9 - 10.3 mg/dL 8.0  7.6  7.2      Outpatient HD orders:  [place] MWF  4h  400/ 800  3K/2.5Ca  85kg  AVF    Last post weight 93.6 kg on 08/20/22 Meds:  Sensipar 30 mg three times a week with HD Venofer 50 mg weekly Mircera 150 mcg every 2 weeks (last dose 08/15/22) Calcitriol 1.75 mcg three times a week with HD     Assessment/Plan:    # ESRD - typically MWF HD outpatient.  He was overloaded and on pressors s/p mitral valve repair.  S/p nontunneled catheter  with critical care on 10/27 with CRRT initiation for overload - has been on CRRT starting early 10/28, wt's are down 9 kg from 102 > 93kg. Loolks euvolemic on exam today, sitting up in bed, no pressor support. Will dc CRRT and plan next HD off schedule tomorrow 10/30. Will keep on TTS schedule for this week since he requires hep B isolation.    # Severe mitral regurgitation  - s/p right mini thoroctomy and mitral valve right annuloplasty on 08/21/22.    # Acute Stroke  - Neurology following - CT head on 10/27 thankfully without acute process or new hypodensity - blood pressure goals per neurology and critical care   # Chronic diastolic CHF  - optimize cardiopulmonary status with CRRT    # HTN  - hx of such  - now off of pressors post-operatively    # Hepatitis B - Noted  - HD in isolation room upstairs   # Anemia CKD - note ESA recently given outpatient as well as setting of recent CVA - holding ESA here for now   # Metabolic bone disease  - paused sensipar with hypocalcemia and will reassess per trends - resumed calcitriol 1.75 mcg three times a week  - stopped [place] as on CRRT     Claudia Desanctis,  MD 08/24/2022 6:46 AM

## 2022-08-26 NOTE — Progress Notes (Signed)
[address]       [place] [address]             [phone]      5 Days Post-Op  Procedure(s) (LRB): MINIMALLY INVASIVE MITRAL VALVE REPAIR USING RING ANNULOPLASTY SIMULUS 32m (N/A) TRANSESOPHAGEAL ECHOCARDIOGRAM (TEE) (N/A)   Total Length of Stay:  LOS: 5 days    SUBJECTIVE: Converted to NSR on amio drip Feels well. Ambulated For dialysis today Vitals:   08/26/22 0500 08/26/22 0600  BP: (!) 127/91 100/82  Pulse: 83 82  Resp: 15 16  Temp:    SpO2: 97% 96%    Intake/Output      10/30 0701 10/31 0700 10/31 0701 11/01 0700   P.O. 600    I.V. (mL/kg) 388.1 (4.2)    IV Piggyback     Total Intake(mL/kg) 988.1 (10.6)    Stool     CRRT 527    Total Output 527    Net +461.1             sodium chloride Stopped (08/23/22 1023)   sodium chloride     sodium chloride Stopped (08/23/22 0941)   amiodarone 30 mg/hr (08/26/22 0610)   lactated ringers Stopped (08/23/22 1024)   lactated ringers 20 mL/hr at 08/22/22 1242    CBC    Component Value Date/Time   WBC 9.0 08/26/2022 0439   RBC 2.63 (L) 08/26/2022 0439   HGB 8.5 (L) 08/26/2022 0439   HGB 13.1 07/25/2022 1244   HCT 25.1 (L) 08/26/2022 0439   HCT 39.2 07/25/2022 1244   PLT 128 (L) 08/26/2022 0439   PLT 224 07/25/2022 1244   MCV 95.4 08/26/2022 0439   MCV 91 07/25/2022 1244   MCH 32.3 08/26/2022 0439   MCHC 33.9 08/26/2022 0439   RDW 16.4 (H) 08/26/2022 0439   RDW 17.1 (H) 07/25/2022 1244   LYMPHSABS 2.6 02/09/2022 2255   LYMPHSABS 2.2 09/20/2018 0936   MONOABS 0.7 02/09/2022 2255   EOSABS 0.2 02/09/2022 2255   EOSABS 0.1 09/20/2018 0936   BASOSABS 0.0 02/09/2022 2255   BASOSABS 0.0 09/20/2018 0936   CMP     Component Value Date/Time   NA 134 (L) 08/26/2022 0439   NA 141 07/25/2022 1244   K 4.0 08/26/2022 0439   CL 98 08/26/2022 0439   CO2 23 08/26/2022 0439   GLUCOSE 119 (H) 08/26/2022 0439   BUN 34 (H) 08/26/2022 0439   BUN 18 07/25/2022 1244   CREATININE 6.35  (H) 08/26/2022 0439   CREATININE 9.83 (H) 04/17/2022 0345   CALCIUM 8.8 (L) 08/26/2022 0439   PROT 8.6 (H) 08/19/2022 1204   PROT 8.2 05/01/2021 1055   ALBUMIN 2.8 (L) 08/26/2022 0439   ALBUMIN 5.3 (H) 05/01/2021 1055   AST 15 08/19/2022 1204   ALT 17 08/19/2022 1204   ALKPHOS 144 (H) 08/19/2022 1204   BILITOT 0.5 08/19/2022 1204   BILITOT 0.4 05/01/2021 1055   GFRNONAA 10 (L) 08/26/2022 0439   GFRNONAA 17 (L) 08/31/2017 0908   GFRAA 8 (L) 09/28/2020 1012   GFRAA 20 (L) 08/31/2017 0908   ABG    Component Value Date/Time   PHART 7.390 08/22/2022 0459   PCO2ART 32.2 08/22/2022 0459   PO2ART 137 (H) 08/22/2022 0459   HCO3 19.5 (L) 08/22/2022 0459   TCO2 20 (L) 08/22/2022 0459   ACIDBASEDEF 5.0 (H) 08/22/2022 0459   O2SAT 99 08/22/2022 0459   CBG (last 3)  Recent Labs  08/24/22 0820 08/24/22 1127 08/24/22 1544  GLUCAP 84 115* 122*   EXAM Lungs: overall clear Card: rr without m Ext: warm  ASSESSMENT: POD #5 SP mini thorocotomy MV repair Doing well Transfer to floor  Transition amio to po Remove CRRT catheter if ok with Renal    Coralie Common, MD @DATE @

## 2022-08-26 NOTE — Progress Notes (Signed)
NAME:  Albert Eaton, MRN:  [id number], DOB:  [birth year], LOS: 5 ADMISSION DATE:  08/21/2022, CONSULTATION DATE:  08/21/22 REFERRING MD:  Lavonna Monarch, CHIEF COMPLAINT:  SOB   History of Present Illness:  56 year old man with hx of ESRD on HD, prior RCC s/p nephrectomy, HIV, asthma, sleep apnea who presents with symptomatic mitral insufficiency and underwent mini thoracotomy today with mitral valve ring annuloplasty with bypass support (82 min crossclamp, 130 min total).  He arrives back to the unit on pressors, vent and PCCM to assist with management.  Pertinent  Medical History   Past Medical History:  Diagnosis Date   Anemia    Aortic atherosclerosis (HCC)    Arthritis    Asthma    Cancer ([place])    renal cyst   Chronic diastolic CHF (congestive heart failure) ([place]) 01/06/2017   Echo 7/16 [facility] in [place], [name]) Mild AI, mild LAE, mild concentric LVH, EF 55, normal wall motion, mild to moderate MR, mild PI, RVSP 55 // Echo 10/09/16 ([facility]):  Moderate LVH, grade 2 diastolic dysfunction, mild MR, moderate LAE    CKD (chronic kidney disease)    Dialysis Mon Wed Fri   Esophagitis    GERD (gastroesophageal reflux disease)    Gout    no current problems   Heart murmur    never caused any problems   Hepatitis    Hep B   History of nuclear stress test    a. Nuc study 7/16: no scar or ischemia, EF 42 // b. Nuc study 12/17: EF 48, ?small apical ischemia, Low Risk   HIV (human immunodeficiency virus infection) ([place])    HLD (hyperlipidemia)    Hypertension    Hypertensive heart disease with CHF (congestive heart failure) ([place]) 10/09/2016   Hypothyroidism    Mitral regurgitation    Neuropathy    OSA (obstructive sleep apnea)    Post-operative nausea and vomiting    1 time as a child, no problems as an adult   Sleep apnea    uses c-pap   Thyroid disease    Wears glasses    Wears partial dentures      Significant Hospital Events: Including procedures, antibiotic start and  stop dates in addition to other pertinent events   08/21/22 Right Mini thoroctomy Extracorporeal circulation 3.   Mitral valve ring annuloplasty using a 30 mm Simulus semi-rigid annuloplasty band (SN [id number]  Extubation followed by seizure-like episode, apnea, emergent reintubation, code stroke>> complete L frontal infarct  10/27 extubated, started on CRRT 10/28>> CRRT  Interim History / Subjective:  No issues overnight CRRT was stopped Patient will receive scheduled hemodialysis today  Objective   Blood pressure 119/71, pulse (!) 114, temperature 98.2 F (36.8 C), temperature source Oral, resp. rate (!) 24, height 5' 10"  (1.778 m), weight 93.2 kg, SpO2 96 %.        Intake/Output Summary (Last 24 hours) at 08/26/2022 1140 Last data filed at 08/26/2022 1000 Gross per 24 hour  Intake 893.72 ml  Output --  Net 893.72 ml   Filed Weights   08/24/22 0500 08/25/22 0500 08/25/22 1000  Weight: 98.1 kg 97.1 kg 93.2 kg    Examination: Physical exam: General: Middle aged male, lying on the bed HEENT: [place]/AT, eyes anicteric.  moist mucus membranes Neuro: Alert, awake following commands Chest: Sternotomy incision looks clean, coarse breath sounds, no wheezes or rhonchi Heart: Regular rate and rhythm, no murmurs or gallops Abdomen: Soft, nontender, nondistended, bowel sounds present  Skin: No rash   Patient Lines/Drains/Airways Status     Active Line/Drains/Airways     Name Placement date Placement time Site Days   Peripheral IV 08/21/22 16 G Right Hand 08/21/22  0635  Hand  3   Fistula / Graft Left Forearm Arteriovenous fistula 07/02/18  0850  Forearm  1514   Fistula / Graft Left Upper arm Arteriovenous fistula 08/27/18  1108  Upper arm  1458   Hemodialysis Catheter Left Subclavian 08/23/22  0800  Subclavian  1   Incision (Closed) 02/13/22 Abdomen Other (Comment) 02/13/22  1134  -- 192   Incision (Closed) 08/21/22 Chest Other (Comment) 08/21/22  1057  -- 3   Incision (Closed)  08/21/22 Groin 08/21/22  1115  -- 3            Labs improved with CRRT No CBC No CXR  Resolved Hospital Problem list   N/A  Assessment & Plan:  S/P mitral valve annuloplasty with cardiac pulmonary bypass and mini thoracotomy ESRD with intermittent issues with vasoplegia HIV- CD4 694 04/17/22, undetectable load, on ART HTN, HLD, Hypothyroidism, OSA, Asthma Acute stroke/seizures were ruled out  Encourage IS Patient is walking in the hallway CRRT was stopped Patient will have a scheduled hemodialysis today Continue nebs as ordered Continue HAART Continue pain control with morphine and tramadol Continue metoprolol Continue aspirin and statin  Best Practice (right click and "Reselect all SmartList Selections" daily)      Jacky Kindle, MD [facility] See Amion for pager If no response to pager, please call [phone] until 7pm After 7pm, Please call E-link [phone]

## 2022-08-26 NOTE — Progress Notes (Addendum)
[facility] Progress Note  Name: Albert Eaton MRN: [id number] DOB: 1966/01/22  Chief Complaint:  Came in for valve repair, had post op complications of resp arrest and seizure-like activity.   Subjective: no new c/o  ------------------ Background on consult:  Albert Eaton is a 56 y.o. male with a history of ESRD, chronic diastolic CHF, HTN, HIV, OSA, severe mitral regurgitation, and hepatitis B who presented to the hospital for planned mitral valve repair.  He underwent right mini thoroctomy and mitral valve right annuloplasty on 08/21/22.  Note post-op he had an episode of diffuse myoclonus and respiratory arrest requiring reintubation.  Had a left frontal infarct noted on imaging which was felt to be cardioembolic event secondary to recent cardiac procedure.  Neurology followed.  He was extubated  10/29, pressors off now also.   Vitals:  Vitals:   08/26/22 1000 08/26/22 1100 08/26/22 1115 08/26/22 1139  BP: (!) 138/90 119/71    Pulse: 87 88 (!) 114   Resp: (!) 23 (!) 21 (!) 24   Temp:    99 F (37.2 C)  TempSrc:    Oral  SpO2: 97% 97% 96%   Weight:      Height:         Physical Exam:   General: adult male in bed in NAD  HEENT: NCAT Eyes: EOMI sclera anicteric Neck: supple trachea midline  Heart: S1S2  Lungs: clear but reduced on auscultation Abdomen: soft/nt/obese habitus  Extremities: no pitting edema lower extremities Skin: no rash on extremities exposed  Neuro: alert and oriented x 3 Access temp HD cath L chest + L AVF +bruit   Outpatient HD orders:  [place] MWF  4h  400/ 800  3K/2.5Ca  85kg  AVF     Meds:  Sensipar 30 mg three times a week with HD Venofer 50 mg weekly Mircera 150 mcg every 2 weeks (last dose 08/15/22) Calcitriol 1.75 mcg three times a week with HD   Assessment/Plan:   # ESRD - typically MWF HD outpatient.  He was overloaded and on pressors s/p mitral valve repair.  S/p nontunneled catheter with critical care on 10/27 with  CRRT initiation for overload - CRRT was started 10/28, wt's were down 9 kg from 102 > 93kg and pt was off pressors so CRRT was dc'd on 10/30. Will plan for iHD from here w/ next HD today off schedule and keep on TTS schedule for this week (due to heavy mwf census).    # Severe mitral regurgitation  - s/p right mini thoroctomy and mitral valve right annuloplasty on 08/21/22.    # Acute Stroke  - Neurology following - CT head on 10/27 thankfully without acute process or new hypodensity - blood pressure goals per neurology and critical care   # Chronic diastolic CHF  - optimize cardiopulmonary status with CRRT    # HTN  - hx of such  - now off of pressors post-operatively    # Hepatitis B - Noted  - HD in isolation room upstairs   # Anemia CKD - note ESA recently given outpatient as well as setting of recent CVA - holding ESA here for now   # Metabolic bone disease  - paused sensipar with hypocalcemia and reassess per trends - resumed calcitriol 1.75 mcg three times a week  - stopped [place] as on CRRT   Kelly Splinter, MD 08/25/2022, 9:00 AM  Recent Labs  Lab 08/25/22 0451 08/26/22 0439  HGB 8.9* 8.5*  ALBUMIN 3.0* 2.8*  CALCIUM 8.6* 8.8*  PHOS 3.1 4.7*  CREATININE 3.66* 6.35*  K 3.9 4.0     Inpatient medications:  acetaminophen  1,000 mg Oral Q6H   Or   acetaminophen (TYLENOL) oral liquid 160 mg/5 mL  1,000 mg Per Tube Q6H   amiodarone  200 mg Oral BID   amLODipine  10 mg Oral Daily   arformoterol  15 mcg Nebulization BID   aspirin EC  325 mg Oral Daily   Or   aspirin  324 mg Per Tube Daily   bictegravir-emtricitabine-tenofovir AF  1 tablet Oral Daily   bisacodyl  10 mg Oral Daily   Or   bisacodyl  10 mg Rectal Daily   budesonide (PULMICORT) nebulizer solution  0.25 mg Nebulization BID   calcitRIOL  1.75 mcg Oral Q M,W,F-HD   Chlorhexidine Gluconate Cloth  6 each Topical Daily   Chlorhexidine Gluconate Cloth  6 each Topical Daily   Chlorhexidine Gluconate  Cloth  6 each Topical Q0600   docusate sodium  200 mg Oral Daily   levothyroxine  25 mcg Oral QAC breakfast   loratadine  10 mg Oral Daily   lubiprostone  24 mcg Oral BID WC   metoCLOPramide (REGLAN) injection  5 mg Intravenous Q8H   metoprolol tartrate  25 mg Oral BID   Or   metoprolol tartrate  12.5 mg Per Tube BID   pantoprazole  40 mg Oral QHS   rOPINIRole  1 mg Oral QHS   rosuvastatin  10 mg Oral Daily   sodium chloride flush  10-40 mL Intracatheter Q12H   sodium chloride flush  3 mL Intravenous Q12H    sodium chloride Stopped (08/23/22 1023)   sodium chloride     sodium chloride Stopped (08/23/22 0941)   lactated ringers Stopped (08/23/22 1024)   lactated ringers 20 mL/hr at 08/22/22 1242   sodium chloride, albuterol, azelastine, famotidine, gabapentin, metoprolol tartrate, morphine injection, olopatadine, ondansetron (ZOFRAN) IV, mouth rinse, oxyCODONE, sodium chloride flush, sodium chloride flush, traMADol

## 2022-08-26 NOTE — Progress Notes (Addendum)
Received patient in bed to unit.  Alert and oriented.  Informed consent signed and in chart.   Treatment initiated: 1540 Treatment completed: 1912  Patient tolerated well.  Transported back to the room  Alert, without acute distress.  Hand-off given to patient's nurse.  Access used: fistula left arm  Access issues: none  Total UF removed: 3 liters Medication(s) given: none Post HD VS: 112/90 MAP 99 HR 86 RR 16 Sat 98% on room air Temp oral 98.2  Post HD weight: 99.3 kg   [facility]

## 2022-08-27 DIAGNOSIS — I340 Nonrheumatic mitral (valve) insufficiency: Secondary | ICD-10-CM

## 2022-08-27 LAB — CBC
HCT: 25.9 % — ABNORMAL LOW (ref 39.0–52.0)
Hemoglobin: 8.5 g/dL — ABNORMAL LOW (ref 13.0–17.0)
MCH: 31.3 pg (ref 26.0–34.0)
MCHC: 32.8 g/dL (ref 30.0–36.0)
MCV: 95.2 fL (ref 80.0–100.0)
Platelets: 156 10*3/uL (ref 150–400)
RBC: 2.72 MIL/uL — ABNORMAL LOW (ref 4.22–5.81)
RDW: 16.5 % — ABNORMAL HIGH (ref 11.5–15.5)
WBC: 7.3 10*3/uL (ref 4.0–10.5)
nRBC: 0 % (ref 0.0–0.2)

## 2022-08-27 LAB — BASIC METABOLIC PANEL
Anion gap: 13 (ref 5–15)
BUN: 25 mg/dL — ABNORMAL HIGH (ref 6–20)
CO2: 28 mmol/L (ref 22–32)
Calcium: 8.8 mg/dL — ABNORMAL LOW (ref 8.9–10.3)
Chloride: 95 mmol/L — ABNORMAL LOW (ref 98–111)
Creatinine, Ser: 5.54 mg/dL — ABNORMAL HIGH (ref 0.61–1.24)
GFR, Estimated: 11 mL/min — ABNORMAL LOW (ref >60)
Glucose, Bld: 95 mg/dL (ref 70–99)
Potassium: 3.8 mmol/L (ref 3.5–5.1)
Sodium: 136 mmol/L (ref 135–145)

## 2022-08-27 MED ORDER — CALCITRIOL 0.25 MCG PO CAPS: 1.7500 ug | ORAL_CAPSULE | ORAL | 3 refills | Status: DC

## 2022-08-27 MED ORDER — METOPROLOL TARTRATE 25 MG PO TABS: 25.0000 mg | ORAL_TABLET | Freq: Two times a day (BID) | ORAL | 3 refills | Status: DC

## 2022-08-27 MED ORDER — AMIODARONE HCL 200 MG PO TABS: 200.0000 mg | ORAL_TABLET | Freq: Two times a day (BID) | ORAL | 1 refills | Status: DC

## 2022-08-27 MED ORDER — OXYCODONE HCL 5 MG PO TABS: 5.0000 mg | ORAL_TABLET | ORAL | 0 refills | Status: DC | PRN

## 2022-08-27 NOTE — Progress Notes (Signed)
D/c non tunneled HD cath per order. Pt tolerated well.   Lavenia Atlas, RN

## 2022-08-27 NOTE — Progress Notes (Signed)
      [address]       [place] [address]             [phone]      6 Days Post-Op Procedure(s) (LRB): MINIMALLY INVASIVE MITRAL VALVE REPAIR USING RING ANNULOPLASTY SIMULUS 67m (N/A) TRANSESOPHAGEAL ECHOCARDIOGRAM (TEE) (N/A)  Subjective:  Patient doing well.  He has no complaints.  States he spoke with Nephrologist about possible d/c today.  Objective: Vital signs in last 24 hours: Temp:  [98.6 F (37 C)-99.6 F (37.6 C)] 98.6 F (37 C) (11/01 0845) Pulse Rate:  [81-114] 91 (11/01 0845) Cardiac Rhythm: Normal sinus rhythm (11/01 0849) Resp:  [13-26] 20 (11/01 0845) BP: (94-142)/(62-90) 122/75 (11/01 0845) SpO2:  [93 %-99 %] 94 % (11/01 0845) Weight:  [92.5 kg-93.3 kg] 92.5 kg (11/01 0600)  Intake/Output from previous day: 10/31 0701 - 11/01 0700 In: 852.6 [P.O.:840; I.V.:12.6] Out: 3000   General appearance: alert, cooperative, and no distress Heart: regular rate and rhythm Lungs: clear to auscultation bilaterally Abdomen: soft, non-tender; bowel sounds normal; no masses,  no organomegaly Extremities: extremities normal, atraumatic, no cyanosis or edema Wound: clean and dry  Lab Results: Recent Labs    08/26/22 0439 08/27/22 0419  WBC 9.0 7.3  HGB 8.5* 8.5*  HCT 25.1* 25.9*  PLT 128* 156   BMET:  Recent Labs    08/26/22 0439 08/27/22 0419  NA 134* 136  K 4.0 3.8  CL 98 95*  CO2 23 28  GLUCOSE 119* 95  BUN 34* 25*  CREATININE 6.35* 5.54*  CALCIUM 8.8* 8.8*    PT/INR: No results for input(s): "LABPROT", "INR" in the last 72 hours. ABG    Component Value Date/Time   PHART 7.390 08/22/2022 0459   HCO3 19.5 (L) 08/22/2022 0459   TCO2 20 (L) 08/22/2022 0459   ACIDBASEDEF 5.0 (H) 08/22/2022 0459   O2SAT 99 08/22/2022 0459   CBG (last 3)  Recent Labs    08/24/22 1127 08/24/22 1544  GLUCAP 115* 122*    Assessment/Plan: S/P Procedure(s) (LRB): MINIMALLY INVASIVE MITRAL VALVE REPAIR USING RING ANNULOPLASTY SIMULUS 338m (N/A) TRANSESOPHAGEAL ECHOCARDIOGRAM (TEE) (N/A)  CV- NSR, BP stable- continue Amiodarone, lopressor, Norvasc Pulm- no acute issues, continue IS Renal- ESRD, Neprhology following.. will check on making sure outpatient dialysis is arranged D/C HD Central line catheter Hypothyroid- continue synthroid HIV Dispo- patient stable, will await to see if dialysis can be arranged.. for possible d/c later today   LOS: 6 days    ErEllwood HandlerPA-C 08/27/2022

## 2022-08-27 NOTE — Progress Notes (Signed)
D/C tele. Went over AVS with pt and all questions were addressed.   Lavenia Atlas, RN

## 2022-08-27 NOTE — Progress Notes (Signed)
Contacted by nephrologist with request that pt receive out-pt HD off schedule tomorrow and pt's regular appt on Friday. Contacted [facility] and spoke to [place]. Clinic is able to treat pt tomorrow. Pt will need to arrive at 7:35 am for 7:55 am chair time. Pt's Friday schedule will be the same which is pt's normal schedule. Spoke to pt via phone. Pt made aware of appt time for tomorrow and agreeable to plan. Pt aware he will treat Thursday and Friday. Update provided to the team via secure chat. HD appt for tomorrow added to pt's AVS.   Melven Sartorius Renal Navigator [phone]

## 2022-08-27 NOTE — Progress Notes (Signed)
CARDIAC REHAB PHASE I   PRE:  Rate/Rhythm: 88 NSR  BP:  Sitting: 128/89      SaO2: 95 RA  MODE:  Ambulation: 470 ft   POST:  Rate/Rhythm: 103 ST  BP:  Sitting: 132/87      SaO2: 95 RA  Pt ambulated in the hallway with standby assistance. Pt asymptomatic, was returned to room w/o complaint. Pt was educated on thoracotomy restrictions, ex guidelines, heart healthy diet,  continued IS use, and [facility]. Pt will be referred to [facility] in [place].    Christen Bame  11:06 AM 08/27/2022

## 2022-08-27 NOTE — Progress Notes (Signed)
NAME:  Albert Eaton, MRN:  [id number], DOB:  06-Feb-1966, LOS: 6 ADMISSION DATE:  08/21/2022, CONSULTATION DATE:  08/21/22 REFERRING MD:  Lavonna Monarch, CHIEF COMPLAINT:  SOB   History of Present Illness:  57 year old man with hx of ESRD on HD, prior RCC s/p nephrectomy, HIV, asthma, sleep apnea who presents with symptomatic mitral insufficiency and underwent mini thoracotomy today with mitral valve ring annuloplasty with bypass support (82 min crossclamp, 130 min total).  He arrives back to the unit on pressors, vent and PCCM to assist with management.  Pertinent  Medical History   Past Medical History:  Diagnosis Date   Anemia    Aortic atherosclerosis (HCC)    Arthritis    Asthma    Cancer ([place])    renal cyst   Chronic diastolic CHF (congestive heart failure) ([place]) 01/06/2017   Echo 7/16 [facility] in [place], [name]) Mild AI, mild LAE, mild concentric LVH, EF 55, normal wall motion, mild to moderate MR, mild PI, RVSP 55 // Echo 10/09/16 ([facility]):  Moderate LVH, grade 2 diastolic dysfunction, mild MR, moderate LAE    CKD (chronic kidney disease)    Dialysis Mon Wed Fri   Esophagitis    GERD (gastroesophageal reflux disease)    Gout    no current problems   Heart murmur    never caused any problems   Hepatitis    Hep B   History of nuclear stress test    a. Nuc study 7/16: no scar or ischemia, EF 42 // b. Nuc study 12/17: EF 48, ?small apical ischemia, Low Risk   HIV (human immunodeficiency virus infection) ([place])    HLD (hyperlipidemia)    Hypertension    Hypertensive heart disease with CHF (congestive heart failure) ([place]) 10/09/2016   Hypothyroidism    Mitral regurgitation    Neuropathy    OSA (obstructive sleep apnea)    Post-operative nausea and vomiting    1 time as a child, no problems as an adult   Sleep apnea    uses c-pap   Thyroid disease    Wears glasses    Wears partial dentures      Significant Hospital Events: Including procedures, antibiotic start and  stop dates in addition to other pertinent events   08/21/22 Right Mini thoroctomy Extracorporeal circulation 3.   Mitral valve ring annuloplasty using a 30 mm Simulus semi-rigid annuloplasty band (SN [id number]  Extubation followed by seizure-like episode, apnea, emergent reintubation, code stroke>> complete L frontal infarct  10/27 extubated, started on CRRT 10/28>> CRRT  Interim History / Subjective:  No issues overnight Received hemodialysis yesterday  Objective   Blood pressure 122/75, pulse 91, temperature 98.6 F (37 C), temperature source Oral, resp. rate 20, height 5' 10"  (1.778 m), weight 92.5 kg, SpO2 94 %.        Intake/Output Summary (Last 24 hours) at 08/27/2022 0943 Last data filed at 08/26/2022 2148 Gross per 24 hour  Intake 732.62 ml  Output 3000 ml  Net -2267.38 ml   Filed Weights   08/25/22 1000 08/26/22 2028 08/27/22 0600  Weight: 93.2 kg 93.3 kg 92.5 kg    Examination: Physical exam: General: Middle aged male, lying on the bed HEENT: [place]/AT, eyes anicteric.  moist mucus membranes Neuro: Alert, awake following commands Chest: Sternotomy incision looks clean, coarse breath sounds, no wheezes or rhonchi Heart: Regular rate and rhythm, no murmurs or gallops Abdomen: Soft, nontender, nondistended, bowel sounds present Skin: No rash  Resolved Hospital Problem  list   N/A  Assessment & Plan:  S/P mitral valve annuloplasty with cardiac pulmonary bypass and mini thoracotomy ESRD with intermittent issues with vasoplegia HIV- CD4 694 04/17/22, undetectable load, on ART HTN, HLD, Hypothyroidism, OSA, Asthma Acute stroke/seizures were ruled out  Encourage IS Patient is walking in the hallway Received hemodialysis per schedule yesterday Continue nebs as ordered Continue HAART Continue pain control with morphine and tramadol Continue metoprolol Continue aspirin and statin  Best Practice (right click and "Reselect all SmartList Selections" daily)       Jacky Kindle, MD [facility] See Amion for pager If no response to pager, please call [phone] until 7pm After 7pm, Please call E-link [phone]

## 2022-08-27 NOTE — Progress Notes (Signed)
Pt admitted to rm 15 from [facility]. CHG wipe given. Initiated tele. Oriented pt to the unit. VSS. Call bell within reach.   Lavenia Atlas, RN

## 2022-08-27 NOTE — Progress Notes (Signed)
[facility] Progress Note  Name: Albert Eaton MRN: [id number] DOB: 1966/10/25  Subjective: no new c/o, feeling very well and wants to go home.   ------------------ Background on consult:  Albert Eaton is a 56 y.o. male with a history of ESRD, chronic diastolic CHF, HTN, HIV, OSA, severe mitral regurgitation, and hepatitis B who presented to the hospital for planned mitral valve repair.  He underwent right mini thoroctomy and mitral valve right annuloplasty on 08/21/22.  Note post-op he had an episode of diffuse myoclonus and respiratory arrest requiring reintubation.  Had a left frontal infarct noted on imaging which was felt to be cardioembolic event secondary to recent cardiac procedure.  Neurology followed.  He was extubated  10/29, pressors off now also.   Vitals:  Vitals:   08/27/22 0801 08/27/22 0811 08/27/22 0845 08/27/22 1207  BP:   122/75 113/77  Pulse:   91 87  Resp:   20 20  Temp: 99.6 F (37.6 C)  98.6 F (37 C) 98.6 F (37 C)  TempSrc: Oral  Oral Oral  SpO2:  95% 94% 93%  Weight:      Height:         Physical Exam:   General: adult male in bed in NAD  HEENT: NCAT Eyes: EOMI sclera anicteric Neck: supple trachea midline  Heart: S1S2  Lungs: clear but reduced on auscultation Abdomen: soft/nt/obese habitus  Extremities: no pitting edema lower extremities Neuro: alert and oriented x 3 Access temp HD cath L chest + L AVF +bruit   Outpatient HD orders:  [place] MWF  4h  400/ 800  3K/2.5Ca  85kg  AVF     Meds:  Sensipar 30 mg three times a week with HD Venofer 50 mg weekly Mircera 150 mcg every 2 weeks (last dose 08/15/22) Calcitriol 1.75 mcg three times a week with HD   Assessment/Plan:   # ESRD - typically MWF HD outpatient.  He was overloaded and on pressors s/p mitral valve repair.  S/p nontunneled catheter with critical care on 10/27 with CRRT initiation for overload - CRRT was started 10/28, wt's were down 9 kg from 102 > 93kg and pt  was off pressors so CRRT was dc'd on 10/30. Pt had iHD here yesterday off schedule. Going home today and we have arranged for OP HD tomorrow off schedule, then resume usual Friday dialysis, thus getting 3 sessions for this week.    # Severe mitral regurgitation  - s/p right mini thoroctomy and mitral valve right annuloplasty on 08/21/22.    # Acute Stroke  - Neurology following - CT head on 10/27 thankfully without acute process or new hypodensity   # HTN  - hx of   # Hepatitis B - Noted  - HD in isolation room upstairs   # Anemia CKD - note ESA recently given outpatient as well as setting of recent CVA - holding ESA here for now   # Metabolic bone disease  - paused sensipar with hypocalcemia and reassess per trends - resumed calcitriol 1.75 mcg three times a week   # Dispo - for dc today    Kelly Splinter, MD 08/25/2022, 9:00 AM  Recent Labs  Lab 08/25/22 0451 08/26/22 0439 08/27/22 0419  HGB 8.9* 8.5* 8.5*  ALBUMIN 3.0* 2.8*  --   CALCIUM 8.6* 8.8* 8.8*  PHOS 3.1 4.7*  --   CREATININE 3.66* 6.35* 5.54*  K 3.9 4.0 3.8     Inpatient medications:  amiodarone  200 mg Oral BID   amLODipine  10 mg Oral Daily   arformoterol  15 mcg Nebulization BID   aspirin EC  325 mg Oral Daily   Or   aspirin  324 mg Per Tube Daily   bictegravir-emtricitabine-tenofovir AF  1 tablet Oral Daily   bisacodyl  10 mg Oral Daily   Or   bisacodyl  10 mg Rectal Daily   budesonide (PULMICORT) nebulizer solution  0.25 mg Nebulization BID   calcitRIOL  1.75 mcg Oral Q M,W,F-HD   Chlorhexidine Gluconate Cloth  6 each Topical Daily   Chlorhexidine Gluconate Cloth  6 each Topical Daily   Chlorhexidine Gluconate Cloth  6 each Topical Q0600   docusate sodium  200 mg Oral Daily   levothyroxine  25 mcg Oral QAC breakfast   loratadine  10 mg Oral Daily   lubiprostone  24 mcg Oral BID WC   metoCLOPramide (REGLAN) injection  5 mg Intravenous Q8H   metoprolol tartrate  25 mg Oral BID    pantoprazole  40 mg Oral QHS   rOPINIRole  1 mg Oral QHS   rosuvastatin  10 mg Oral Daily   sodium chloride flush  10-40 mL Intracatheter Q12H   sodium chloride flush  3 mL Intravenous Q12H    sodium chloride Stopped (08/23/22 1023)   sodium chloride     sodium chloride Stopped (08/23/22 0941)   lactated ringers Stopped (08/23/22 1024)   lactated ringers 20 mL/hr at 08/22/22 1242   sodium chloride, albuterol, azelastine, famotidine, gabapentin, metoprolol tartrate, morphine injection, olopatadine, ondansetron (ZOFRAN) IV, mouth rinse, oxyCODONE, sodium chloride flush, sodium chloride flush, traMADol

## 2022-08-28 ENCOUNTER — Telehealth: Payer: Self-pay | Admitting: Nephrology

## 2022-08-28 ENCOUNTER — Ambulatory Visit: Payer: Medicare Other | Admitting: Orthopaedic Surgery

## 2022-08-28 DIAGNOSIS — Q238 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: Secondary | ICD-10-CM | POA: Insufficient documentation

## 2022-08-28 DIAGNOSIS — E876 Hypokalemia: Secondary | ICD-10-CM | POA: Diagnosis not present

## 2022-08-28 DIAGNOSIS — D631 Anemia in chronic kidney disease: Secondary | ICD-10-CM | POA: Diagnosis not present

## 2022-08-28 DIAGNOSIS — D509 Iron deficiency anemia, unspecified: Secondary | ICD-10-CM | POA: Diagnosis not present

## 2022-08-28 DIAGNOSIS — R52 Pain, unspecified: Secondary | ICD-10-CM | POA: Diagnosis not present

## 2022-08-28 DIAGNOSIS — Z992 Dependence on renal dialysis: Secondary | ICD-10-CM | POA: Diagnosis not present

## 2022-08-28 DIAGNOSIS — N186 End stage renal disease: Secondary | ICD-10-CM | POA: Diagnosis not present

## 2022-08-28 DIAGNOSIS — N2581 Secondary hyperparathyroidism of renal origin: Secondary | ICD-10-CM | POA: Diagnosis not present

## 2022-08-28 DIAGNOSIS — E079 Disorder of thyroid, unspecified: Secondary | ICD-10-CM | POA: Diagnosis not present

## 2022-08-28 DIAGNOSIS — I6781 Acute cerebrovascular insufficiency: Secondary | ICD-10-CM | POA: Insufficient documentation

## 2022-08-28 NOTE — Telephone Encounter (Unsigned)
Transition of care contact from inpatient facility  Date of Discharge: 08/27/22 Date of Contact: 08/28/22 Method of contact: Phone  Attempted to contact patient to discuss transition of care from inpatient admission. Patient did not answer the phone.  Will have follow up at [facility]  tomorrow

## 2022-08-29 ENCOUNTER — Telehealth: Payer: Self-pay | Admitting: *Deleted

## 2022-08-29 ENCOUNTER — Encounter (HOSPITAL_BASED_OUTPATIENT_CLINIC_OR_DEPARTMENT_OTHER): Payer: Self-pay

## 2022-08-29 ENCOUNTER — Telehealth: Payer: Self-pay | Admitting: Cardiovascular Disease

## 2022-08-29 ENCOUNTER — Emergency Department (HOSPITAL_BASED_OUTPATIENT_CLINIC_OR_DEPARTMENT_OTHER): Payer: Medicare Other

## 2022-08-29 ENCOUNTER — Other Ambulatory Visit: Payer: Self-pay

## 2022-08-29 ENCOUNTER — Emergency Department (HOSPITAL_BASED_OUTPATIENT_CLINIC_OR_DEPARTMENT_OTHER): Payer: Medicare Other | Admitting: Radiology

## 2022-08-29 ENCOUNTER — Inpatient Hospital Stay (HOSPITAL_BASED_OUTPATIENT_CLINIC_OR_DEPARTMENT_OTHER)
Admission: EM | Admit: 2022-08-29 | Discharge: 2022-08-31 | DRG: 974 | Disposition: A | Discharge status: Home / Self Care | Payer: Medicare Other | Attending: Family Medicine | Admitting: Family Medicine

## 2022-08-29 ENCOUNTER — Encounter (HOSPITAL_COMMUNITY): Payer: Self-pay

## 2022-08-29 DIAGNOSIS — R0902 Hypoxemia: Secondary | ICD-10-CM | POA: Diagnosis not present

## 2022-08-29 DIAGNOSIS — J439 Emphysema, unspecified: Secondary | ICD-10-CM | POA: Diagnosis not present

## 2022-08-29 DIAGNOSIS — G4733 Obstructive sleep apnea (adult) (pediatric): Secondary | ICD-10-CM | POA: Diagnosis present

## 2022-08-29 DIAGNOSIS — G2581 Restless legs syndrome: Secondary | ICD-10-CM | POA: Diagnosis not present

## 2022-08-29 DIAGNOSIS — I4891 Unspecified atrial fibrillation: Secondary | ICD-10-CM | POA: Diagnosis not present

## 2022-08-29 DIAGNOSIS — J90 Pleural effusion, not elsewhere classified: Secondary | ICD-10-CM | POA: Diagnosis not present

## 2022-08-29 DIAGNOSIS — R0781 Pleurodynia: Secondary | ICD-10-CM | POA: Diagnosis not present

## 2022-08-29 DIAGNOSIS — M109 Gout, unspecified: Secondary | ICD-10-CM | POA: Diagnosis present

## 2022-08-29 DIAGNOSIS — B181 Chronic viral hepatitis B without delta-agent: Secondary | ICD-10-CM | POA: Diagnosis present

## 2022-08-29 DIAGNOSIS — J189 Pneumonia, unspecified organism: Principal | ICD-10-CM | POA: Diagnosis present

## 2022-08-29 DIAGNOSIS — I132 Hypertensive heart and chronic kidney disease with heart failure and with stage 5 chronic kidney disease, or end stage renal disease: Secondary | ICD-10-CM | POA: Diagnosis present

## 2022-08-29 DIAGNOSIS — E785 Hyperlipidemia, unspecified: Secondary | ICD-10-CM | POA: Diagnosis not present

## 2022-08-29 DIAGNOSIS — Z79890 Hormone replacement therapy (postmenopausal): Secondary | ICD-10-CM

## 2022-08-29 DIAGNOSIS — Z820 Family history of epilepsy and other diseases of the nervous system: Secondary | ICD-10-CM

## 2022-08-29 DIAGNOSIS — I5032 Chronic diastolic (congestive) heart failure: Secondary | ICD-10-CM | POA: Diagnosis present

## 2022-08-29 DIAGNOSIS — J9811 Atelectasis: Secondary | ICD-10-CM | POA: Diagnosis not present

## 2022-08-29 DIAGNOSIS — K5090 Crohn's disease, unspecified, without complications: Secondary | ICD-10-CM | POA: Diagnosis present

## 2022-08-29 DIAGNOSIS — Z8673 Personal history of transient ischemic attack (TIA), and cerebral infarction without residual deficits: Secondary | ICD-10-CM

## 2022-08-29 DIAGNOSIS — Z888 Allergy status to other drugs, medicaments and biological substances status: Secondary | ICD-10-CM

## 2022-08-29 DIAGNOSIS — E079 Disorder of thyroid, unspecified: Secondary | ICD-10-CM | POA: Diagnosis not present

## 2022-08-29 DIAGNOSIS — Z79899 Other long term (current) drug therapy: Secondary | ICD-10-CM

## 2022-08-29 DIAGNOSIS — Z87891 Personal history of nicotine dependence: Secondary | ICD-10-CM

## 2022-08-29 DIAGNOSIS — Z992 Dependence on renal dialysis: Secondary | ICD-10-CM | POA: Diagnosis not present

## 2022-08-29 DIAGNOSIS — I471 Supraventricular tachycardia, unspecified: Secondary | ICD-10-CM | POA: Diagnosis present

## 2022-08-29 DIAGNOSIS — Z9889 Other specified postprocedural states: Secondary | ICD-10-CM

## 2022-08-29 DIAGNOSIS — Z952 Presence of prosthetic heart valve: Secondary | ICD-10-CM

## 2022-08-29 DIAGNOSIS — Z9049 Acquired absence of other specified parts of digestive tract: Secondary | ICD-10-CM

## 2022-08-29 DIAGNOSIS — M1990 Unspecified osteoarthritis, unspecified site: Secondary | ICD-10-CM | POA: Diagnosis present

## 2022-08-29 DIAGNOSIS — J4520 Mild intermittent asthma, uncomplicated: Secondary | ICD-10-CM | POA: Diagnosis not present

## 2022-08-29 DIAGNOSIS — I4892 Unspecified atrial flutter: Secondary | ICD-10-CM | POA: Diagnosis present

## 2022-08-29 DIAGNOSIS — R52 Pain, unspecified: Secondary | ICD-10-CM | POA: Diagnosis not present

## 2022-08-29 DIAGNOSIS — D631 Anemia in chronic kidney disease: Secondary | ICD-10-CM | POA: Diagnosis present

## 2022-08-29 DIAGNOSIS — E039 Hypothyroidism, unspecified: Secondary | ICD-10-CM | POA: Diagnosis not present

## 2022-08-29 DIAGNOSIS — N2581 Secondary hyperparathyroidism of renal origin: Secondary | ICD-10-CM | POA: Diagnosis present

## 2022-08-29 DIAGNOSIS — Z86010 Personal history of colonic polyps: Secondary | ICD-10-CM

## 2022-08-29 DIAGNOSIS — Z1152 Encounter for screening for COVID-19: Secondary | ICD-10-CM | POA: Diagnosis not present

## 2022-08-29 DIAGNOSIS — N186 End stage renal disease: Secondary | ICD-10-CM

## 2022-08-29 DIAGNOSIS — Z8269 Family history of other diseases of the musculoskeletal system and connective tissue: Secondary | ICD-10-CM

## 2022-08-29 DIAGNOSIS — I700 Atherosclerosis of aorta: Secondary | ICD-10-CM | POA: Diagnosis not present

## 2022-08-29 DIAGNOSIS — E876 Hypokalemia: Secondary | ICD-10-CM | POA: Diagnosis not present

## 2022-08-29 DIAGNOSIS — R079 Chest pain, unspecified: Secondary | ICD-10-CM | POA: Diagnosis not present

## 2022-08-29 DIAGNOSIS — Z7982 Long term (current) use of aspirin: Secondary | ICD-10-CM

## 2022-08-29 DIAGNOSIS — I10 Essential (primary) hypertension: Secondary | ICD-10-CM | POA: Diagnosis present

## 2022-08-29 DIAGNOSIS — Z8249 Family history of ischemic heart disease and other diseases of the circulatory system: Secondary | ICD-10-CM

## 2022-08-29 DIAGNOSIS — G629 Polyneuropathy, unspecified: Secondary | ICD-10-CM | POA: Diagnosis present

## 2022-08-29 DIAGNOSIS — K2100 Gastro-esophageal reflux disease with esophagitis, without bleeding: Secondary | ICD-10-CM | POA: Diagnosis present

## 2022-08-29 DIAGNOSIS — R509 Fever, unspecified: Secondary | ICD-10-CM

## 2022-08-29 DIAGNOSIS — I272 Pulmonary hypertension, unspecified: Secondary | ICD-10-CM | POA: Diagnosis present

## 2022-08-29 DIAGNOSIS — Z811 Family history of alcohol abuse and dependence: Secondary | ICD-10-CM

## 2022-08-29 DIAGNOSIS — I480 Paroxysmal atrial fibrillation: Secondary | ICD-10-CM | POA: Diagnosis not present

## 2022-08-29 DIAGNOSIS — B20 Human immunodeficiency virus [HIV] disease: Secondary | ICD-10-CM | POA: Diagnosis present

## 2022-08-29 DIAGNOSIS — Z85528 Personal history of other malignant neoplasm of kidney: Secondary | ICD-10-CM

## 2022-08-29 DIAGNOSIS — Z905 Acquired absence of kidney: Secondary | ICD-10-CM

## 2022-08-29 DIAGNOSIS — R0789 Other chest pain: Secondary | ICD-10-CM | POA: Diagnosis not present

## 2022-08-29 DIAGNOSIS — Z7951 Long term (current) use of inhaled steroids: Secondary | ICD-10-CM

## 2022-08-29 DIAGNOSIS — Z8719 Personal history of other diseases of the digestive system: Secondary | ICD-10-CM

## 2022-08-29 DIAGNOSIS — D509 Iron deficiency anemia, unspecified: Secondary | ICD-10-CM | POA: Diagnosis not present

## 2022-08-29 HISTORY — DX: Dependence on renal dialysis: Z99.2

## 2022-08-29 HISTORY — DX: End stage renal disease: N18.6

## 2022-08-29 LAB — CBC
HCT: 27.5 % — ABNORMAL LOW (ref 39.0–52.0)
Hemoglobin: 9 g/dL — ABNORMAL LOW (ref 13.0–17.0)
MCH: 31.3 pg (ref 26.0–34.0)
MCHC: 32.7 g/dL (ref 30.0–36.0)
MCV: 95.5 fL (ref 80.0–100.0)
Platelets: 272 10*3/uL (ref 150–400)
RBC: 2.88 MIL/uL — ABNORMAL LOW (ref 4.22–5.81)
RDW: 16.1 % — ABNORMAL HIGH (ref 11.5–15.5)
WBC: 10.3 10*3/uL (ref 4.0–10.5)
nRBC: 0 % (ref 0.0–0.2)

## 2022-08-29 LAB — BASIC METABOLIC PANEL
Anion gap: 15 (ref 5–15)
BUN: 20 mg/dL (ref 6–20)
CO2: 29 mmol/L (ref 22–32)
Calcium: 9.6 mg/dL (ref 8.9–10.3)
Chloride: 95 mmol/L — ABNORMAL LOW (ref 98–111)
Creatinine, Ser: 4.63 mg/dL — ABNORMAL HIGH (ref 0.61–1.24)
GFR, Estimated: 14 mL/min — ABNORMAL LOW (ref >60)
Glucose, Bld: 100 mg/dL — ABNORMAL HIGH (ref 70–99)
Potassium: 4 mmol/L (ref 3.5–5.1)
Sodium: 139 mmol/L (ref 135–145)

## 2022-08-29 LAB — RESP PANEL BY RT-PCR (FLU A&B, COVID) ARPGX2
Influenza A by PCR: NEGATIVE
Influenza B by PCR: NEGATIVE
SARS Coronavirus 2 by RT PCR: NEGATIVE

## 2022-08-29 LAB — LACTIC ACID, PLASMA: Lactic Acid, Venous: 0.8 mmol/L (ref 0.5–1.9)

## 2022-08-29 LAB — TROPONIN I (HIGH SENSITIVITY)
Troponin I (High Sensitivity): 160 ng/L (ref <18)
Troponin I (High Sensitivity): 148 ng/L (ref <18)

## 2022-08-29 MED ORDER — SODIUM CHLORIDE 0.9 % IV SOLN
500.0000 mg | Freq: Once | INTRAVENOUS | Status: AC
  Administered 2022-08-29: 500 mg via INTRAVENOUS
  Filled 2022-08-29: qty 5

## 2022-08-29 MED ORDER — MORPHINE SULFATE (PF) 4 MG/ML IV SOLN
4.0000 mg | Freq: Once | INTRAVENOUS | Status: AC
  Administered 2022-08-29: 4 mg via INTRAVENOUS
  Filled 2022-08-29: qty 1

## 2022-08-29 MED ORDER — MORPHINE SULFATE (PF) 4 MG/ML IV SOLN
4.0000 mg | INTRAVENOUS | Status: DC | PRN
  Administered 2022-08-30 (×3): 4 mg via INTRAVENOUS
  Filled 2022-08-29 (×3): qty 1

## 2022-08-29 MED ORDER — OXYCODONE-ACETAMINOPHEN 5-325 MG PO TABS
2.0000 | ORAL_TABLET | Freq: Once | ORAL | Status: AC
  Administered 2022-08-29: 2 via ORAL
  Filled 2022-08-29: qty 2

## 2022-08-29 MED ORDER — IOHEXOL 350 MG/ML SOLN
75.0000 mL | Freq: Once | INTRAVENOUS | Status: AC | PRN
  Administered 2022-08-29: 75 mL via INTRAVENOUS

## 2022-08-29 MED ORDER — SODIUM CHLORIDE 0.9 % IV SOLN
1.0000 g | Freq: Once | INTRAVENOUS | Status: AC
  Administered 2022-08-29: 1 g via INTRAVENOUS
  Filled 2022-08-29: qty 10

## 2022-08-29 NOTE — Telephone Encounter (Signed)
I spoke with the patient transferred into triage for chest pain and shortness of breath.  He has had continuous right sided chest pain that is 8/10 since he woke up today.  He overslept and was late taking his pain medication. At noon took oxycodone and tylenol with no improvement. Pain is worse with deep breaths or hiccups.  Not febrile. He is moving around and took a short walk yesterday.  Yesterday he had a bowel movement and is passing gas.  He does not have a pulse ox to measure O2.  He said he is taking fast short breaths and is talking fast.  He is using incentive spirometer up to 1000.  I let him know I would send a message to his cardiologist but that I thought he should call Dr. Nicholos Johns office for further recommendations.

## 2022-08-29 NOTE — ED Notes (Signed)
CRITICAL VALUE STICKER  CRITICAL VALUE:trop 160  RECEIVER (on-site recipient of call):I. Lucrecia Mcphearson  DATE & TIME NOTIFIED: 08/29/22 1605  MESSENGER (representative from lab):Lab  MD NOTIFIED: [place]  RESPONSE:

## 2022-08-29 NOTE — ED Provider Notes (Signed)
[facility] EMERGENCY DEPT Provider Note   CSN: [id number] Arrival date & time: 08/29/22  1447     History  Chief Complaint  Patient presents with   Chest Pain    Albert Eaton is a 56 y.o. male.  Patient is a old male who presents with chest pain and shortness of breath.  He has a history that is notable for HIV,, CHF, prior renal cell carcinoma status post nephrectomy, a-fib RVR.  He recently had a mitral valve replacement on Oct 26th.  He woke up this morning with pain in the right side of his chest.  Intense heavy feeling.  It is worse when he takes a deep breath.  It is worse when he moves.  He also complains of some shortness of breath.  He denies any increased leg swelling.  He did go to dialysis today and completed most of his dialysis other than the last 40 minutes his machine clotted he has a little bit of coughing.  No known fevers.  However he is noted to be febrile on arrival.       Home Medications Prior to Admission medications   Medication Sig Start Date End Date Taking? Authorizing Provider  ACETAMINOPHEN EXTRA STRENGTH 500 MG tablet Take 1,000 mg by mouth every 6 (six) hours as needed (pain.). 02/20/21   [provider]  amiodarone (PACERONE) 200 MG tablet Take 1 tablet (200 mg total) by mouth 2 (two) times daily. X 7 days, then decrease to 200 mg daily 08/27/22   Barrett, Erin R, PA-C  amLODipine (NORVASC) 10 MG tablet Take 1 tablet (10 mg total) by mouth daily. Patient taking differently: Take 10 mg by mouth daily after lunch. 10/31/16   [name], FNP  aspirin EC 81 MG tablet Take 1 tablet (81 mg total) by mouth daily. Swallow whole. 03/06/21   Richardson Dopp T, PA-C  azelastine (ASTELIN) 0.1 % nasal spray Place 2 sprays into both nostrils 2 (two) times daily as needed for allergies. 05/13/22   Valentina Shaggy, MD  B Complex-C-Folic Acid (DIALYVITE PO) Take 1 tablet by mouth daily.    [provider]  BIKTARVY 50-200-25 MG  TABS tablet TAKE ONE TABLET BY MOUTH EVERY DAY 06/26/22   Campbell Riches, MD  budesonide-formoterol [facility]) 160-4.5 MCG/ACT inhaler Inhale 2 puffs into the lungs daily. 08/06/22   Valentina Shaggy, MD  calcitRIOL (ROCALTROL) 0.25 MCG capsule Take 7 capsules (1.75 mcg total) by mouth every Monday, Wednesday, and Friday with hemodialysis. 08/27/22   Barrett, Erin R, PA-C  desloratadine (CLARINEX) 5 MG tablet Take 1 tablet (5 mg total) by mouth daily. Patient taking differently: Take 5 mg by mouth every evening. 05/13/22 07/28/24  Valentina Shaggy, MD  famotidine (PEPCID) 20 MG tablet Take 1 tablet (20 mg total) by mouth 2 (two) times daily. Patient taking differently: Take 20 mg by mouth 2 (two) times daily as needed for indigestion or heartburn. 02/12/22   [name], MD  ferric citrate (AURYXIA) 1 GM 210 MG(Fe) tablet Take 420 mg by mouth with breakfast, with lunch, and with evening meal.    [provider]  gabapentin (NEURONTIN) 300 MG capsule TAKE 1 CAPSULE(300 MG) BY MOUTH THREE TIMES DAILY Patient taking differently: Take 300 mg by mouth 3 (three) times daily as needed (pain/neuropathy). 10/09/20   [name], FNP  ipratropium (ATROVENT) 0.06 % nasal spray USE 2 SPRAYS IN EACH NOSTRIL 2 TO 3 TIMES DAILY AS NEEDED 05/13/22  Valentina Shaggy, MD  lactulose [facility]) 10 GM/15ML solution Take 20 g by mouth daily as needed for moderate constipation.  07/08/20   [provider]  levalbuterol Penne Lash [facility]) 45 MCG/ACT inhaler Inhale 2 puffs into the lungs every 4 (four) hours as needed for wheezing. 06/05/22   Valentina Shaggy, MD  levothyroxine (SYNTHROID) 25 MCG tablet Take 25 mcg by mouth daily before breakfast. 03/31/22   [provider]  lubiprostone (AMITIZA) 24 MCG capsule Take 1 capsule (24 mcg total) by mouth 2 (two) times daily with a meal. NEEDS OFFICE VISIT FOR FURTHER REFILLS 03/03/22   Pyrtle, Lajuan Lines, MD  Methoxy PEG-Epoetin Beta  (MIRCERA IJ) Inject 1 each into the skin every 30 (thirty) days. Administered at dialysis center once a month 11/08/20   [provider]  metoprolol tartrate (LOPRESSOR) 25 MG tablet Take 1 tablet (25 mg total) by mouth 2 (two) times daily. 08/27/22   Barrett, Erin R, PA-C  Olopatadine HCl (PATADAY) 0.7 % SOLN Place 1 drop into both eyes 2 (two) times daily as needed (allergies).    [provider]  oxyCODONE (OXY IR/ROXICODONE) 5 MG immediate release tablet Take 1 tablet (5 mg total) by mouth every 4 (four) hours as needed for severe pain. 08/27/22   Barrett, Erin R, PA-C  pantoprazole (PROTONIX) 40 MG tablet Take 1 tablet (40 mg total) by mouth daily. **PLEASE CONTACT THE OFFICE TO SCHEDULE FOLLOW UP Patient taking differently: Take 40 mg by mouth at bedtime. 03/27/22   Esterwood, Amy S, PA-C  rOPINIRole (REQUIP) 1 MG tablet TAKE ONE TABLET BY MOUTH AT BEDTIME 01/15/22   Olalere, Adewale A, MD  rosuvastatin (CRESTOR) 10 MG tablet TAKE ONE TABLET BY MOUTH DAILY 12/13/21   [name], MD  SENSIPAR 60 MG tablet Take 60 mg by mouth daily with supper. 06/25/22   [provider]  Spacer/Aero-Holding Josiah Lobo DEVI 1 Device by Does not apply route 2 (two) times a day. 03/01/19   Bobbitt, Sedalia Muta, MD  Vitamin D, Ergocalciferol, (DRISDOL) 1.25 MG (50000 UNIT) CAPS capsule Take 50,000 Units by mouth every Tuesday.    [provider]      Allergies    Ace inhibitors    Review of Systems   Review of Systems  Constitutional:  Positive for fatigue. Negative for chills, diaphoresis and fever.  HENT:  Negative for congestion, rhinorrhea and sneezing.   Eyes: Negative.   Respiratory:  Positive for shortness of breath. Negative for cough and chest tightness.   Cardiovascular:  Positive for chest pain. Negative for leg swelling.  Gastrointestinal:  Negative for abdominal pain, blood in stool, diarrhea, nausea and vomiting.  Genitourinary:  Negative for difficulty urinating,  flank pain, frequency and hematuria.  Musculoskeletal:  Negative for arthralgias and back pain.  Skin:  Negative for rash.  Neurological:  Negative for dizziness, speech difficulty, weakness, numbness and headaches.    Physical Exam Updated Vital Signs BP 116/71   Pulse 93   Temp 100.1 F (37.8 C)   Resp 18   Ht 5' 10"  (1.778 m)   Wt 92.5 kg   SpO2 91%   BMI 29.26 kg/m  Physical Exam Constitutional:      Appearance: He is well-developed.     Comments: Appears uncomfortable  HENT:     Head: Normocephalic and atraumatic.  Eyes:     Pupils: Pupils are equal, round, and reactive to light.  Cardiovascular:     Rate and Rhythm: Normal rate and  regular rhythm.     Heart sounds: Normal heart sounds.  Pulmonary:     Effort: Pulmonary effort is normal. No respiratory distress.     Breath sounds: Normal breath sounds. No wheezing or rales.     Comments: Mildly tachypneic, no accessory muscle use Chest:     Chest wall: No tenderness.  Abdominal:     General: Bowel sounds are normal.     Palpations: Abdomen is soft.     Tenderness: There is no abdominal tenderness. There is no guarding or rebound.  Musculoskeletal:        General: Normal range of motion.     Cervical back: Normal range of motion and neck supple.     Comments: Trace edema to lower extremities bilaterally, no calf tenderness  Lymphadenopathy:     Cervical: No cervical adenopathy.  Skin:    General: Skin is warm and dry.     Findings: No rash.  Neurological:     Mental Status: He is alert and oriented to person, place, and time.     ED Results / Procedures / Treatments   Labs (all labs ordered are listed, but only abnormal results are displayed) Labs Reviewed  BASIC METABOLIC PANEL - Abnormal; Notable for the following components:      Result Value   Chloride 95 (*)    Glucose, Bld 100 (*)    Creatinine, Ser 4.63 (*)    GFR, Estimated 14 (*)    All other components within normal limits  CBC - Abnormal;  Notable for the following components:   RBC 2.88 (*)    Hemoglobin 9.0 (*)    HCT 27.5 (*)    RDW 16.1 (*)    All other components within normal limits  TROPONIN I (HIGH SENSITIVITY) - Abnormal; Notable for the following components:   Troponin I (High Sensitivity) 160 (*)    All other components within normal limits  TROPONIN I (HIGH SENSITIVITY) - Abnormal; Notable for the following components:   Troponin I (High Sensitivity) 148 (*)    All other components within normal limits  RESP PANEL BY RT-PCR (FLU A&B, COVID) ARPGX2  CULTURE, BLOOD (ROUTINE X 2)  CULTURE, BLOOD (ROUTINE X 2)  LACTIC ACID, PLASMA  LACTIC ACID, PLASMA    EKG EKG Interpretation  Date/Time:  Friday August 29 2022 14:55:34 EDT Ventricular Rate:  126 PR Interval:    QRS Duration: 82 QT Interval:  284 QTC Calculation: 411 R Axis:   87 Text Interpretation: Atrial flutter with variable A-V block Abnormal ECG When compared with ECG of 22-Aug-2022 06:43, Atrial flutter has replaced Sinus rhythm Vent. rate has increased BY  50 BPM Non-specific change in ST segment in Inferior leads ST no longer elevated in Lateral leads T wave inversion more evident in Inferior leads T wave inversion now evident in Lateral leads Confirmed by Malvin Johns [phone]) on 08/29/2022 3:08:52 PM  Radiology CT Angio Chest PE W/Cm &/Or Wo Cm  Result Date: 08/29/2022 CLINICAL DATA:  Chest pain and shortness of breath. Chest pain is pleuritic. Possible pneumonia. Hypoxia. Mitral valve replacement 8 days ago. The chest pain is pleuritic. EXAM: CT ANGIOGRAPHY CHEST WITH CONTRAST TECHNIQUE: Multidetector CT imaging of the chest was performed using the standard protocol during bolus administration of intravenous contrast. Multiplanar CT image reconstructions and MIPs were obtained to evaluate the vascular anatomy. RADIATION DOSE REDUCTION: This exam was performed according to the departmental dose-optimization program which includes automated  exposure control, adjustment of the mA  and/or kV according to patient size and/or use of iterative reconstruction technique. CONTRAST:  19m OMNIPAQUE IOHEXOL 350 MG/ML SOLN COMPARISON:  Chest radiographs obtained earlier today. Chest CTA dated 02/11/2021. FINDINGS: Cardiovascular: Stable enlarged heart. Minimal pericardial fluid with a maximum thickness of 5 mm. Atheromatous calcifications, including the coronary arteries and aorta. Normally opacified pulmonary arteries with no pulmonary arterial filling defects seen. Mediastinum/Nodes: No enlarged mediastinal, hilar, or axillary lymph nodes. Thyroid gland, trachea, and esophagus demonstrate no significant findings. Lungs/Pleura: Small to moderate-sized right pleural effusion with multiple loculations. Small left pleural effusion. Bilateral lower lobe atelectasis, greater on the right. Small amount of atelectasis in the lingula and right middle lobe and in the posterior aspects of both upper lobes. No separate airspace consolidation on either side. Mild left apical paraseptal bullous changes. Upper Abdomen: Cholecystectomy clips. Two small calcified granulomata in the spleen. Musculoskeletal: Mild thoracic and lower cervical spine degenerative changes. These include Schmorl's nodes at multiple levels. Tiny amount of air in the pectoralis major muscle on the right. Interval oval, low medium density mass or fluid collection within and/or between the pectoralis muscles laterally on the right measuring approximately 6.6 x 1.9 cm and 13 Hounsfield units in density on image number 103/5. There is also a small surgical clip the posterior aspect of this process on image number 89/5 and mild irregular underlying pleural thickening. Review of the MIP images confirms the above findings. IMPRESSION: 1. No pulmonary emboli. 2. Interval right anterolateral chest wall postsurgical changes including a small amount postoperative air in the pectoralis musculature on the right as  well as a probable postoperative hematoma within the pectoralis musculature on the right. This is compatible with changes related to the patient's recent mitral valve replacement. 3. Interval small to moderate-sized right pleural effusion with multiple loculations. 4. Interval small left pleural effusion. 5. Bilateral atelectasis. 6. Stable cardiomegaly. 7. Calcific coronary artery and aortic atherosclerosis. 8. Mild left apical paraseptal bullous changes. Aortic Atherosclerosis (ICD10-I70.0) and Emphysema (ICD10-J43.9). Electronically Signed   By: SClaudie ReveringM.D.   On: 08/29/2022 17:48   DG Chest 2 View  Result Date: 08/29/2022 CLINICAL DATA:  Chest pain EXAM: CHEST - 2 VIEW COMPARISON:  08/25/2022, 08/23/2022 FINDINGS: Mitral valve prosthesis. Removal of previously noted left-sided central venous catheter. Cardiomegaly with central congestion. Interim patchy bilateral lower lung consolidations and mild airspace disease in the medial right upper lobe. Small bilateral pleural effusions. No visible pneumothorax. IMPRESSION: 1. Cardiomegaly with central vascular congestion and small bilateral pleural effusions. 2. Interim development of patchy bilateral lower lung consolidations and mild airspace disease in the medial right upper lobe, suspicious for bilateral pneumonia. Electronically Signed   By: KDonavan FoilM.D.   On: 08/29/2022 15:24    Procedures Procedures    Medications Ordered in ED Medications  cefTRIAXone (ROCEPHIN) 1 g in sodium chloride 0.9 % 100 mL IVPB (0 g Intravenous Stopped 08/29/22 1801)  azithromycin (ZITHROMAX) 500 mg in sodium chloride 0.9 % 250 mL IVPB (0 mg Intravenous Stopped 08/29/22 1925)  oxyCODONE-acetaminophen (PERCOCET/ROXICET) 5-325 MG per tablet 2 tablet (2 tablets Oral Given 08/29/22 1713)  iohexol (OMNIPAQUE) 350 MG/ML injection 75 mL (75 mLs Intravenous Contrast Given 08/29/22 1713)    ED Course/ Medical Decision Making/ A&P                           Medical  Decision Making Amount and/or Complexity of Data Reviewed Labs: ordered. Radiology: ordered.  Risk Prescription  drug management. Decision regarding hospitalization.   Patient is a 56 year old male who had a recent mitral valve replacement and presents with chest pain and shortness of breath.  He was noted to be febrile with a temperature of 100.6.  Chest x-ray shows concerns for bilateral pneumonia.  He was started on IV antibiotics.  Given his recent surgery, a CTA the chest was performed.  There is no evidence of PE.  There appears to be some loculated moderate-sized pleural effusions.  His labs show normal white count.  His troponins are elevated but stable on trending.  His creatinine is elevated consistent with his end-stage renal disease.  His potassium is normal.  I spoke with Dr. Roxan Hockey with CT surgery.  They will follow the patient along.  I spoke with Dr. Nevada Crane with the hospitalist service who will admit the patient.  Also of note, on arrival patient had an EKG that showed a flutter with RVR.  He spontaneously converted to a sinus rhythm.  On chart review, he does have a history of atrial fibrillation.  CRITICAL CARE Performed by: Malvin Johns Total critical care time: 60 minutes Critical care time was exclusive of separately billable procedures and treating other patients. Critical care was necessary to treat or prevent imminent or life-threatening deterioration. Critical care was time spent personally by me on the following activities: development of treatment plan with patient and/or surrogate as well as nursing, discussions with consultants, evaluation of patient's response to treatment, examination of patient, obtaining history from patient or surrogate, ordering and performing treatments and interventions, ordering and review of laboratory studies, ordering and review of radiographic studies, pulse oximetry and re-evaluation of patient's condition.   Final Clinical  Impression(s) / ED Diagnoses Final diagnoses:  Pleural effusion  Chest pain, unspecified type  Atrial fibrillation, unspecified type ([place])  Fever, unspecified fever cause    Rx / DC Orders ED Discharge Orders     None         Malvin Johns, MD 08/29/22 2035

## 2022-08-29 NOTE — Telephone Encounter (Signed)
Left detailed message per DPR of Dr Antionette Char comments.

## 2022-08-29 NOTE — ED Triage Notes (Signed)
Patient here POV from Home.  Endorses CP and SOB that the Patient awoke with this AM.   Recent Mitral Valve Replacement 8 Days ago.   NAD Noted during Triage. A&Ox4. GCS 15. BIB Wheelchair.

## 2022-08-29 NOTE — ED Notes (Signed)
Patient placed on 2L [place] at this time.

## 2022-08-29 NOTE — Telephone Encounter (Signed)
Agreed. Thx Michalene. Suspect post-op pain. If uncontrolled after catching up on pain medication he should reach out to surgical office. thx

## 2022-08-29 NOTE — Telephone Encounter (Signed)
Pt c/o of Chest Pain: STAT if CP now or developed within 24 hours  1. Are you having CP right now? Yes   2. Are you experiencing any other symptoms (ex. SOB, nausea, vomiting, sweating)? SOB  3. How long have you been experiencing CP? A few hours  4. Is your CP continuous or coming and going? continuous  5. Have you taken Nitroglycerin? No  ?

## 2022-08-29 NOTE — Telephone Encounter (Signed)
[name] contacted the office c/o increased chest pain and SOB. Patient states his chest pain started this morning. Patient states pain is unrelieved with pain medication and last dose of Oxycodone was at noon. Patient audibly trying to catch his breath while talking to me. States he is SOB at rest. States he attempted to go for a walk but was cut short d/t not being able to catch his breath. Patient states he had dialysis today and was placed on oxygen while getting dialyzed. Asked patient if he had an O2 saturation monitor at home in which he does not. Patient states they do not check oxygen saturations at his dialysis center. Patient denies experiencing any of this in the hospital, stating symptoms are new today. Patient denies fever or any weight gain. Advised patient the best thing to do was to seek care through the emergency department for his increased SOB. Patient verbalized his understanding.

## 2022-08-30 ENCOUNTER — Encounter (HOSPITAL_COMMUNITY): Payer: Self-pay | Admitting: Internal Medicine

## 2022-08-30 DIAGNOSIS — N186 End stage renal disease: Secondary | ICD-10-CM | POA: Diagnosis not present

## 2022-08-30 DIAGNOSIS — G2581 Restless legs syndrome: Secondary | ICD-10-CM | POA: Diagnosis present

## 2022-08-30 DIAGNOSIS — I63442 Cerebral infarction due to embolism of left cerebellar artery: Secondary | ICD-10-CM | POA: Diagnosis not present

## 2022-08-30 DIAGNOSIS — I471 Supraventricular tachycardia, unspecified: Secondary | ICD-10-CM | POA: Diagnosis not present

## 2022-08-30 DIAGNOSIS — I700 Atherosclerosis of aorta: Secondary | ICD-10-CM | POA: Diagnosis not present

## 2022-08-30 DIAGNOSIS — I4892 Unspecified atrial flutter: Secondary | ICD-10-CM | POA: Diagnosis present

## 2022-08-30 DIAGNOSIS — I132 Hypertensive heart and chronic kidney disease with heart failure and with stage 5 chronic kidney disease, or end stage renal disease: Secondary | ICD-10-CM | POA: Diagnosis not present

## 2022-08-30 DIAGNOSIS — K5090 Crohn's disease, unspecified, without complications: Secondary | ICD-10-CM | POA: Diagnosis present

## 2022-08-30 DIAGNOSIS — I5032 Chronic diastolic (congestive) heart failure: Secondary | ICD-10-CM | POA: Diagnosis not present

## 2022-08-30 DIAGNOSIS — J9811 Atelectasis: Secondary | ICD-10-CM | POA: Diagnosis present

## 2022-08-30 DIAGNOSIS — I480 Paroxysmal atrial fibrillation: Secondary | ICD-10-CM | POA: Diagnosis present

## 2022-08-30 DIAGNOSIS — E039 Hypothyroidism, unspecified: Secondary | ICD-10-CM | POA: Diagnosis present

## 2022-08-30 DIAGNOSIS — E785 Hyperlipidemia, unspecified: Secondary | ICD-10-CM | POA: Diagnosis present

## 2022-08-30 DIAGNOSIS — R0602 Shortness of breath: Secondary | ICD-10-CM | POA: Diagnosis not present

## 2022-08-30 DIAGNOSIS — Z952 Presence of prosthetic heart valve: Secondary | ICD-10-CM | POA: Diagnosis not present

## 2022-08-30 DIAGNOSIS — Z992 Dependence on renal dialysis: Secondary | ICD-10-CM | POA: Diagnosis not present

## 2022-08-30 DIAGNOSIS — R0781 Pleurodynia: Secondary | ICD-10-CM

## 2022-08-30 DIAGNOSIS — B20 Human immunodeficiency virus [HIV] disease: Secondary | ICD-10-CM | POA: Diagnosis present

## 2022-08-30 DIAGNOSIS — Z1152 Encounter for screening for COVID-19: Secondary | ICD-10-CM | POA: Diagnosis not present

## 2022-08-30 DIAGNOSIS — R079 Chest pain, unspecified: Secondary | ICD-10-CM | POA: Diagnosis not present

## 2022-08-30 DIAGNOSIS — N250 Renal osteodystrophy: Secondary | ICD-10-CM | POA: Diagnosis not present

## 2022-08-30 DIAGNOSIS — G4733 Obstructive sleep apnea (adult) (pediatric): Secondary | ICD-10-CM | POA: Diagnosis present

## 2022-08-30 DIAGNOSIS — J189 Pneumonia, unspecified organism: Secondary | ICD-10-CM | POA: Diagnosis present

## 2022-08-30 DIAGNOSIS — J4520 Mild intermittent asthma, uncomplicated: Secondary | ICD-10-CM | POA: Diagnosis present

## 2022-08-30 DIAGNOSIS — I272 Pulmonary hypertension, unspecified: Secondary | ICD-10-CM | POA: Diagnosis not present

## 2022-08-30 DIAGNOSIS — B181 Chronic viral hepatitis B without delta-agent: Secondary | ICD-10-CM | POA: Diagnosis present

## 2022-08-30 DIAGNOSIS — Z87891 Personal history of nicotine dependence: Secondary | ICD-10-CM | POA: Diagnosis not present

## 2022-08-30 DIAGNOSIS — J90 Pleural effusion, not elsewhere classified: Secondary | ICD-10-CM | POA: Diagnosis present

## 2022-08-30 DIAGNOSIS — N2581 Secondary hyperparathyroidism of renal origin: Secondary | ICD-10-CM | POA: Diagnosis present

## 2022-08-30 DIAGNOSIS — D631 Anemia in chronic kidney disease: Secondary | ICD-10-CM | POA: Diagnosis not present

## 2022-08-30 MED ORDER — HYDROXYZINE HCL 25 MG PO TABS: 25.0000 mg | ORAL_TABLET | Freq: Three times a day (TID) | ORAL | Status: DC | PRN

## 2022-08-30 MED ORDER — ACETAMINOPHEN 650 MG RE SUPP: 650.0000 mg | Freq: Four times a day (QID) | RECTAL | Status: DC | PRN

## 2022-08-30 MED ORDER — FERRIC CITRATE 1 GM 210 MG(FE) PO TABS
420.0000 mg | ORAL_TABLET | Freq: Three times a day (TID) | ORAL | Status: DC
  Administered 2022-08-31 (×2): 420 mg via ORAL
  Filled 2022-08-30 (×4): qty 2

## 2022-08-30 MED ORDER — PANTOPRAZOLE SODIUM 40 MG PO TBEC
40.0000 mg | DELAYED_RELEASE_TABLET | Freq: Every day | ORAL | Status: DC
  Administered 2022-08-30: 40 mg via ORAL
  Filled 2022-08-30: qty 1

## 2022-08-30 MED ORDER — RENA-VITE PO TABS
1.0000 | ORAL_TABLET | Freq: Every day | ORAL | Status: DC
  Administered 2022-08-31: 1 via ORAL
  Filled 2022-08-30: qty 1

## 2022-08-30 MED ORDER — ROSUVASTATIN CALCIUM 5 MG PO TABS
10.0000 mg | ORAL_TABLET | Freq: Every day | ORAL | Status: DC
  Administered 2022-08-31: 10 mg via ORAL
  Filled 2022-08-30: qty 2

## 2022-08-30 MED ORDER — OLOPATADINE HCL 0.1 % OP SOLN: 1.0000 [drp] | Freq: Two times a day (BID) | OPHTHALMIC | Status: DC | PRN

## 2022-08-30 MED ORDER — MOMETASONE FURO-FORMOTEROL FUM 200-5 MCG/ACT IN AERO
2.0000 | INHALATION_SPRAY | Freq: Two times a day (BID) | RESPIRATORY_TRACT | Status: DC
  Administered 2022-08-31: 2 via RESPIRATORY_TRACT
  Filled 2022-08-30: qty 8.8

## 2022-08-30 MED ORDER — NEPRO/CARBSTEADY PO LIQD: 237.0000 mL | Freq: Three times a day (TID) | ORAL | Status: DC | PRN

## 2022-08-30 MED ORDER — LORATADINE 10 MG PO TABS
10.0000 mg | ORAL_TABLET | Freq: Every day | ORAL | Status: DC
  Administered 2022-08-31: 10 mg via ORAL
  Filled 2022-08-30: qty 1

## 2022-08-30 MED ORDER — ASPIRIN 81 MG PO TBEC
81.0000 mg | DELAYED_RELEASE_TABLET | Freq: Every day | ORAL | Status: DC
  Administered 2022-08-31: 81 mg via ORAL
  Filled 2022-08-30: qty 1

## 2022-08-30 MED ORDER — MORPHINE SULFATE (PF) 4 MG/ML IV SOLN: 4.0000 mg | INTRAVENOUS | Status: DC | PRN

## 2022-08-30 MED ORDER — ALBUTEROL SULFATE (2.5 MG/3ML) 0.083% IN NEBU: 2.5000 mg | INHALATION_SOLUTION | RESPIRATORY_TRACT | Status: DC | PRN

## 2022-08-30 MED ORDER — METOPROLOL TARTRATE 25 MG PO TABS
25.0000 mg | ORAL_TABLET | Freq: Two times a day (BID) | ORAL | Status: DC
  Administered 2022-08-31: 25 mg via ORAL
  Filled 2022-08-30: qty 1

## 2022-08-30 MED ORDER — OXYCODONE HCL 5 MG PO TABS
5.0000 mg | ORAL_TABLET | ORAL | Status: DC | PRN
  Administered 2022-08-30 – 2022-08-31 (×3): 5 mg via ORAL
  Filled 2022-08-30 (×3): qty 1

## 2022-08-30 MED ORDER — ONDANSETRON HCL 4 MG PO TABS: 4.0000 mg | ORAL_TABLET | Freq: Four times a day (QID) | ORAL | Status: DC | PRN

## 2022-08-30 MED ORDER — CALCIUM CARBONATE ANTACID 1250 MG/5ML PO SUSP: 500.0000 mg | Freq: Four times a day (QID) | ORAL | Status: DC | PRN

## 2022-08-30 MED ORDER — HEPARIN SODIUM (PORCINE) 5000 UNIT/ML IJ SOLN
5000.0000 [IU] | Freq: Three times a day (TID) | INTRAMUSCULAR | Status: DC
  Administered 2022-08-30 – 2022-08-31 (×2): 5000 [IU] via SUBCUTANEOUS
  Filled 2022-08-30 (×3): qty 1

## 2022-08-30 MED ORDER — DOCUSATE SODIUM 283 MG RE ENEM: 1.0000 | ENEMA | RECTAL | Status: DC | PRN

## 2022-08-30 MED ORDER — CHLORHEXIDINE GLUCONATE CLOTH 2 % EX PADS
6.0000 | MEDICATED_PAD | Freq: Every day | CUTANEOUS | Status: DC
  Administered 2022-08-31: 6 via TOPICAL

## 2022-08-30 MED ORDER — ACETAMINOPHEN 325 MG PO TABS: 650.0000 mg | ORAL_TABLET | Freq: Four times a day (QID) | ORAL | Status: DC | PRN

## 2022-08-30 MED ORDER — CINACALCET HCL 30 MG PO TABS
60.0000 mg | ORAL_TABLET | Freq: Every day | ORAL | Status: DC
  Filled 2022-08-30 (×2): qty 2

## 2022-08-30 MED ORDER — ZOLPIDEM TARTRATE 5 MG PO TABS: 5.0000 mg | ORAL_TABLET | Freq: Every evening | ORAL | Status: DC | PRN

## 2022-08-30 MED ORDER — HEPARIN SODIUM (PORCINE) 1000 UNIT/ML DIALYSIS
2000.0000 [IU] | INTRAMUSCULAR | Status: DC | PRN
  Administered 2022-08-30: 2000 [IU] via INTRAVENOUS_CENTRAL
  Filled 2022-08-30: qty 2

## 2022-08-30 MED ORDER — BICTEGRAVIR-EMTRICITAB-TENOFOV 50-200-25 MG PO TABS
1.0000 | ORAL_TABLET | Freq: Every day | ORAL | Status: DC
  Filled 2022-08-30 (×2): qty 1

## 2022-08-30 MED ORDER — OXYCODONE HCL 5 MG PO TABS: 5.0000 mg | ORAL_TABLET | ORAL | Status: DC | PRN

## 2022-08-30 MED ORDER — HYDRALAZINE HCL 20 MG/ML IJ SOLN: 5.0000 mg | INTRAMUSCULAR | Status: DC | PRN

## 2022-08-30 MED ORDER — AMIODARONE HCL 200 MG PO TABS: 200.0000 mg | ORAL_TABLET | Freq: Every day | ORAL | Status: DC

## 2022-08-30 MED ORDER — AMIODARONE HCL 200 MG PO TABS
200.0000 mg | ORAL_TABLET | Freq: Two times a day (BID) | ORAL | Status: DC
  Administered 2022-08-30 – 2022-08-31 (×2): 200 mg via ORAL
  Filled 2022-08-30 (×2): qty 1

## 2022-08-30 MED ORDER — SORBITOL 70 % SOLN: 30.0000 mL | Status: DC | PRN

## 2022-08-30 MED ORDER — AMLODIPINE BESYLATE 10 MG PO TABS
10.0000 mg | ORAL_TABLET | Freq: Every day | ORAL | Status: DC
  Administered 2022-08-31: 10 mg via ORAL
  Filled 2022-08-30: qty 1

## 2022-08-30 MED ORDER — ONDANSETRON HCL 4 MG/2ML IJ SOLN: 4.0000 mg | Freq: Four times a day (QID) | INTRAMUSCULAR | Status: DC | PRN

## 2022-08-30 MED ORDER — CALCITRIOL 0.5 MCG PO CAPS: 1.7500 ug | ORAL_CAPSULE | ORAL | Status: DC

## 2022-08-30 MED ORDER — ROPINIROLE HCL 0.5 MG PO TABS
1.0000 mg | ORAL_TABLET | Freq: Every day | ORAL | Status: DC
  Administered 2022-08-30: 1 mg via ORAL
  Filled 2022-08-30: qty 2

## 2022-08-30 MED ORDER — GABAPENTIN 300 MG PO CAPS: 300.0000 mg | ORAL_CAPSULE | Freq: Three times a day (TID) | ORAL | Status: DC | PRN

## 2022-08-30 MED ORDER — LEVOTHYROXINE SODIUM 25 MCG PO TABS
25.0000 ug | ORAL_TABLET | Freq: Every day | ORAL | Status: DC
  Administered 2022-08-31: 25 ug via ORAL
  Filled 2022-08-30 (×2): qty 1

## 2022-08-30 MED ORDER — LUBIPROSTONE 24 MCG PO CAPS
24.0000 ug | ORAL_CAPSULE | Freq: Two times a day (BID) | ORAL | Status: DC
  Administered 2022-08-31: 24 ug via ORAL
  Filled 2022-08-30 (×3): qty 1

## 2022-08-30 MED ORDER — SODIUM CHLORIDE 0.9% FLUSH
3.0000 mL | Freq: Two times a day (BID) | INTRAVENOUS | Status: DC
  Administered 2022-08-30 – 2022-08-31 (×2): 3 mL via INTRAVENOUS

## 2022-08-30 MED ORDER — CAMPHOR-MENTHOL 0.5-0.5 % EX LOTN: 1.0000 | TOPICAL_LOTION | Freq: Three times a day (TID) | CUTANEOUS | Status: DC | PRN

## 2022-08-30 NOTE — H&P (Addendum)
History and Physical    Patient: Albert Eaton BJS:[id number] DOB: Aug 03, 1966 DOA: 08/29/2022 DOS: the patient was seen and examined on 08/30/2022 PCP: [name], NP  Patient coming from: Home - lives alone; NOK: Marikay Alar, [phone]   Chief Complaint: chest wall repair  HPI: Albert Eaton is a 56 y.o. male with medical history significant of chronic diastolic CHF; HIV; HTN; HLD; hypothyroidism; OSA on CPAP; and ESRD on MWF HD presenting with chest wall pain.   He was hospitalized from 10/26-11/2 on the CT surgery service for mitral valve replacement on 10/26.  He was doing well post-operatively.  He was not having pain.  He started with issues on 11/3 about 0400.  He thought it was because he missed some pain medications.  He went to HD and went for a walk but he was very SOB.  He laid on the bed and called the cardiology office -> CT surgery.  He was told to come in to be seen.  No cough.  SOB is persistent, even mildly with conversation.  The pain is slightly better but not as bad.    He did not fully complete HD yesterday because his machine clotted, but he did get to the goal for removing fluid.    He was on CRRT while hospitalized.    ER Course:  DB to [facility] transfer, per Dr. Nevada Crane:  56 year old male with past medical history significant for ESRD on HD MWF, HIV, prior renal cell carcinoma status post nephrectomy, hypothyroidism, HFpEF, OSA, hepatitis B, anemia of chronic disease, asthma, recent admission on 08/21/22 by cardiothoracic surgery which led to mitral valve replacement, discharged yesterday.  CTS, Dr. Roxan Hockey, decline the admission, even though the patient was just discharged yesterday from their service.  The patient presents with sudden onset right-sided chest pain since he woke up this morning.  Associated with increased dyspnea.  The pain is worse with taking a deep breath.  He took his home oxycodone today without significant improvement.  Nevertheless he  went to his hemodialysis session, had about 40 minutes left to complete his session.  He was placed on oxygen supplementation while he was there.  Due to his increased shortness of breath he was advised to go to the ED for further evaluation.   In the ED, febrile with Tmax 100.6.  O2 saturation in the low 90s.  Chest x-ray showing possible pneumonia, bilateral loculated pleural effusion seen on CT scan.  No evidence of pulmonary embolus.  High-sensitivity troponin elevated, peaked at 160 and downtrended.  No evidence of acute ischemia on 12 EKG.  Started on IV antibiotics empirically due to concern for pneumonia.  EDP discussed the case with cardiothoracic surgery, Dr. Roxan Hockey who recommended admission by hospitalist service at [place] will see in consultation.      Review of Systems: As mentioned in the history of present illness. All other systems reviewed and are negative. Past Medical History:  Diagnosis Date   Anemia    Aortic atherosclerosis (HCC)    Arthritis    Asthma    Cancer ([place])    renal cyst   Chronic diastolic CHF (congestive heart failure) ([place]) 01/06/2017   Echo 7/16 [facility] in [place], [name]) Mild AI, mild LAE, mild concentric LVH, EF 55, normal wall motion, mild to moderate MR, mild PI, RVSP 55 // Echo 10/09/16 ([facility]):  Moderate LVH, grade 2 diastolic dysfunction, mild MR, moderate LAE    Esophagitis  ESRD (end stage renal disease) on dialysis [facility])    Dialysis Mon Wed Fri   GERD (gastroesophageal reflux disease)    Gout    no current problems   Hepatitis    Hep B   HIV (human immunodeficiency virus infection) ([place])    HLD (hyperlipidemia)    Hypertension    Hypothyroidism    Mitral regurgitation    Neuropathy    Sleep apnea    uses c-pap   Wears glasses    Wears partial dentures    Past Surgical History:  Procedure Laterality Date   AV FISTULA PLACEMENT Left 07/02/2018   Procedure: Creation of Left arm BRACHIOBASILIC  ARTERIOVENOUS  FISTULA;  Surgeon: Marty Heck, MD;  Location: [place];  Service: Vascular;  Laterality: Left;   [place] Left 08/27/2018   Procedure: Left arm BRACHIOBASILIC VEIN TRANSPOSITION SECOND STAGE;  Surgeon: Marty Heck, MD;  Location: [facility] OR;  Service: Vascular;  Laterality: Left;   BUBBLE STUDY  09/03/2020   Procedure: BUBBLE STUDY;  Surgeon: Fay Records, MD;  Location: [place];  Service: Cardiovascular;;   BUBBLE STUDY  06/03/2022   Procedure: BUBBLE STUDY;  Surgeon: Elouise Munroe, MD;  Location: [place];  Service: Cardiology;;   CAPD INSERTION N/A 05/30/2021   Procedure: LAPAROSCOPIC INSERTION CONTINUOUS AMBULATORY PERITONEAL DIALYSIS  (CAPD) CATHETER;  Surgeon: [name], MD;  Location: [place];  Service: Vascular;  Laterality: N/A;   CAPD REMOVAL N/A 02/13/2022   Procedure: PERITONEAL DIALYSIS CATHETER REMOVAL;  Surgeon: [name], MD;  Location: [facility];  Service: Vascular;  Laterality: N/A;   CHOLECYSTECTOMY     COLONOSCOPY W/ BIOPSIES AND POLYPECTOMY     GIVENS CAPSULE STUDY N/A 03/01/2018   Procedure: GIVENS CAPSULE STUDY;  Surgeon: Jerene Bears, MD;  Location: [place];  Service: Gastroenterology;  Laterality: N/A;   HERNIA REPAIR     As baby   MITRAL VALVE REPAIR N/A 08/21/2022   Procedure: MINIMALLY INVASIVE MITRAL VALVE REPAIR USING RING ANNULOPLASTY SIMULUS 30m;  Surgeon: WCoralie Common MD;  [place]  Service: Open Heart Surgery;  Laterality: N/A;   MULTIPLE TOOTH EXTRACTIONS     NEPHRECTOMY Left 02/18/2021   NOSE SURGERY     RENAL BIOPSY     RIGHT/LEFT HEART CATH AND CORONARY ANGIOGRAPHY N/A 09/04/2021   Procedure: RIGHT/LEFT HEART CATH AND CORONARY ANGIOGRAPHY;  Surgeon: MBurnell Blanks MD;  [place] [place] [facility];  Service: Cardiovascular;  Laterality: N/A;   RIGHT/LEFT HEART CATH AND CORONARY ANGIOGRAPHY N/A 08/01/2022   Procedure: RIGHT/LEFT HEART CATH AND CORONARY ANGIOGRAPHY;   Surgeon: CSherren Mocha MD;  [place];  Service: Cardiovascular;  Laterality: N/A;   TEE WITHOUT CARDIOVERSION N/A 09/03/2020   Procedure: TRANSESOPHAGEAL ECHOCARDIOGRAM (TEE);  Surgeon: RFay Records MD;  [place]  Service: Cardiovascular;  Laterality: N/A;   TEE WITHOUT CARDIOVERSION N/A 06/03/2022   Procedure: TRANSESOPHAGEAL ECHOCARDIOGRAM (TEE);  Surgeon: AElouise Munroe MD;  [place]  Service: Cardiology;  Laterality: N/A;   TEE WITHOUT CARDIOVERSION N/A 08/21/2022   Procedure: TRANSESOPHAGEAL ECHOCARDIOGRAM (TEE);  Surgeon: WCoralie Common MD;  [place]  Service: Open Heart Surgery;  Laterality: N/A;   UPPER GI ENDOSCOPY  04/05/2021   Social History:  reports that he quit smoking about 23 years ago. His smoking use included cigarettes. He has a 18.00 pack-year smoking history. He has never used smokeless tobacco. He reports that he does not currently  use alcohol. He reports that he does not use drugs.  Allergies  Allergen Reactions   Ace Inhibitors Cough and Other (See Comments)    Family History  Problem Relation Age of Onset   Hypertension Mother    Heart failure Mother    Hypertension Father    Alcohol abuse Father    Hypertension Sister    Multiple sclerosis Sister    Hypertension Sister    Hypertension Brother    Heart attack Maternal Grandmother 53   Scoliosis Other    Allergic rhinitis Neg Hx    Angioedema Neg Hx    Asthma Neg Hx    Eczema Neg Hx    Immunodeficiency Neg Hx    Urticaria Neg Hx    Colon cancer Neg Hx    Pancreatic cancer Neg Hx    Esophageal cancer Neg Hx     Prior to Admission medications   Medication Sig Start Date End Date Taking? Authorizing Provider  ACETAMINOPHEN EXTRA STRENGTH 500 MG tablet Take 1,000 mg by mouth every 6 (six) hours as needed (pain.). 02/20/21   [provider]  amiodarone (PACERONE) 200 MG tablet Take 1 tablet (200 mg total) by mouth 2 (two) times  daily. X 7 days, then decrease to 200 mg daily 08/27/22   Barrett, Erin R, PA-C  amLODipine (NORVASC) 10 MG tablet Take 1 tablet (10 mg total) by mouth daily. Patient taking differently: Take 10 mg by mouth daily after lunch. 10/31/16   [name], FNP  aspirin EC 81 MG tablet Take 1 tablet (81 mg total) by mouth daily. Swallow whole. 03/06/21   Richardson Dopp T, PA-C  azelastine (ASTELIN) 0.1 % nasal spray Place 2 sprays into both nostrils 2 (two) times daily as needed for allergies. 05/13/22   Valentina Shaggy, MD  B Complex-C-Folic Acid (DIALYVITE PO) Take 1 tablet by mouth daily.    [provider]  BIKTARVY 50-200-25 MG TABS tablet TAKE ONE TABLET BY MOUTH EVERY DAY 06/26/22   Campbell Riches, MD  budesonide-formoterol [facility]) 160-4.5 MCG/ACT inhaler Inhale 2 puffs into the lungs daily. 08/06/22   Valentina Shaggy, MD  calcitRIOL (ROCALTROL) 0.25 MCG capsule Take 7 capsules (1.75 mcg total) by mouth every Monday, Wednesday, and Friday with hemodialysis. 08/27/22   Barrett, Erin R, PA-C  desloratadine (CLARINEX) 5 MG tablet Take 1 tablet (5 mg total) by mouth daily. Patient taking differently: Take 5 mg by mouth every evening. 05/13/22 07/28/24  Valentina Shaggy, MD  famotidine (PEPCID) 20 MG tablet Take 1 tablet (20 mg total) by mouth 2 (two) times daily. Patient taking differently: Take 20 mg by mouth 2 (two) times daily as needed for indigestion or heartburn. 02/12/22   [name], MD  ferric citrate (AURYXIA) 1 GM 210 MG(Fe) tablet Take 420 mg by mouth with breakfast, with lunch, and with evening meal.    [provider]  gabapentin (NEURONTIN) 300 MG capsule TAKE 1 CAPSULE(300 MG) BY MOUTH THREE TIMES DAILY Patient taking differently: Take 300 mg by mouth 3 (three) times daily as needed (pain/neuropathy). 10/09/20   [name], FNP  ipratropium (ATROVENT) 0.06 % nasal spray USE 2 SPRAYS IN EACH NOSTRIL 2 TO 3 TIMES DAILY AS NEEDED 05/13/22    Valentina Shaggy, MD  lactulose [facility]) 10 GM/15ML solution Take 20 g by mouth daily as needed for moderate constipation.  07/08/20   [provider]  levalbuterol Penne Lash HFA) 45 MCG/ACT inhaler Inhale 2 puffs into the  lungs every 4 (four) hours as needed for wheezing. 06/05/22   Valentina Shaggy, MD  levothyroxine (SYNTHROID) 25 MCG tablet Take 25 mcg by mouth daily before breakfast. 03/31/22   [provider]  lubiprostone (AMITIZA) 24 MCG capsule Take 1 capsule (24 mcg total) by mouth 2 (two) times daily with a meal. NEEDS OFFICE VISIT FOR FURTHER REFILLS 03/03/22   Pyrtle, Lajuan Lines, MD  Methoxy PEG-Epoetin Beta (MIRCERA IJ) Inject 1 each into the skin every 30 (thirty) days. Administered at dialysis center once a month 11/08/20   [provider]  metoprolol tartrate (LOPRESSOR) 25 MG tablet Take 1 tablet (25 mg total) by mouth 2 (two) times daily. 08/27/22   Barrett, Erin R, PA-C  Olopatadine HCl (PATADAY) 0.7 % SOLN Place 1 drop into both eyes 2 (two) times daily as needed (allergies).    [provider]  oxyCODONE (OXY IR/ROXICODONE) 5 MG immediate release tablet Take 1 tablet (5 mg total) by mouth every 4 (four) hours as needed for severe pain. 08/27/22   Barrett, Erin R, PA-C  pantoprazole (PROTONIX) 40 MG tablet Take 1 tablet (40 mg total) by mouth daily. **PLEASE CONTACT THE OFFICE TO SCHEDULE FOLLOW UP Patient taking differently: Take 40 mg by mouth at bedtime. 03/27/22   Esterwood, Amy S, PA-C  rOPINIRole (REQUIP) 1 MG tablet TAKE ONE TABLET BY MOUTH AT BEDTIME 01/15/22   Olalere, Adewale A, MD  rosuvastatin (CRESTOR) 10 MG tablet TAKE ONE TABLET BY MOUTH DAILY 12/13/21   [name], MD  SENSIPAR 60 MG tablet Take 60 mg by mouth daily with supper. 06/25/22   [provider]  Spacer/Aero-Holding Josiah Lobo DEVI 1 Device by Does not apply route 2 (two) times a day. 03/01/19   Bobbitt, Sedalia Muta, MD  Vitamin D, Ergocalciferol, (DRISDOL) 1.25 MG  (50000 UNIT) CAPS capsule Take 50,000 Units by mouth every Tuesday.    [provider]    Physical Exam: Vitals:   08/30/22 1149 08/30/22 1156 08/30/22 1542 08/30/22 1648  BP:   (!) 143/80 (!) 157/89  Pulse: 98     Resp: 20   18  Temp: 98.9 F (37.2 C) 98.9 F (37.2 C)  99.3 F (37.4 C)  TempSrc: Oral Oral  Oral  SpO2: 91%     Weight:      Height:       General:  Appears calm and comfortable and is in NAD, on RA Eyes:  EOMI, normal lids, iris ENT:  grossly normal hearing, lips & tongue, mmm Neck:  no LAD, masses or thyromegaly Cardiovascular:  RRR, no m/r/g. No LE edema. Surgical scar along R pec is healing well Respiratory:   Bibasilar crackles noted.  Normal to mildly increased respiratory effort, on RA. Abdomen:  soft, NT, ND Skin:  no rash or induration seen on limited exam Musculoskeletal:  grossly normal tone BUE/BLE, good ROM, no bony abnormality Psychiatric: blunted mood and affect, speech fluent and appropriate, AOx3 Neurologic:  CN 2-12 grossly intact, moves all extremities in coordinated fashion   Radiological Exams on Admission: Independently reviewed - see discussion in A/P where applicable  CT Angio Chest PE W/Cm &/Or Wo Cm  Result Date: 08/29/2022 CLINICAL DATA:  Chest pain and shortness of breath. Chest pain is pleuritic. Possible pneumonia. Hypoxia. Mitral valve replacement 8 days ago. The chest pain is pleuritic. EXAM: CT ANGIOGRAPHY CHEST WITH CONTRAST TECHNIQUE: Multidetector CT imaging of the chest was performed using the standard protocol during bolus administration of intravenous contrast. Multiplanar  CT image reconstructions and MIPs were obtained to evaluate the vascular anatomy. RADIATION DOSE REDUCTION: This exam was performed according to the departmental dose-optimization program which includes automated exposure control, adjustment of the mA and/or kV according to patient size and/or use of iterative reconstruction technique. CONTRAST:  76m  OMNIPAQUE IOHEXOL 350 MG/ML SOLN COMPARISON:  Chest radiographs obtained earlier today. Chest CTA dated 02/11/2021. FINDINGS: Cardiovascular: Stable enlarged heart. Minimal pericardial fluid with a maximum thickness of 5 mm. Atheromatous calcifications, including the coronary arteries and aorta. Normally opacified pulmonary arteries with no pulmonary arterial filling defects seen. Mediastinum/Nodes: No enlarged mediastinal, hilar, or axillary lymph nodes. Thyroid gland, trachea, and esophagus demonstrate no significant findings. Lungs/Pleura: Small to moderate-sized right pleural effusion with multiple loculations. Small left pleural effusion. Bilateral lower lobe atelectasis, greater on the right. Small amount of atelectasis in the lingula and right middle lobe and in the posterior aspects of both upper lobes. No separate airspace consolidation on either side. Mild left apical paraseptal bullous changes. Upper Abdomen: Cholecystectomy clips. Two small calcified granulomata in the spleen. Musculoskeletal: Mild thoracic and lower cervical spine degenerative changes. These include Schmorl's nodes at multiple levels. Tiny amount of air in the pectoralis major muscle on the right. Interval oval, low medium density mass or fluid collection within and/or between the pectoralis muscles laterally on the right measuring approximately 6.6 x 1.9 cm and 13 Hounsfield units in density on image number 103/5. There is also a small surgical clip the posterior aspect of this process on image number 89/5 and mild irregular underlying pleural thickening. Review of the MIP images confirms the above findings. IMPRESSION: 1. No pulmonary emboli. 2. Interval right anterolateral chest wall postsurgical changes including a small amount postoperative air in the pectoralis musculature on the right as well as a probable postoperative hematoma within the pectoralis musculature on the right. This is compatible with changes related to the  patient's recent mitral valve replacement. 3. Interval small to moderate-sized right pleural effusion with multiple loculations. 4. Interval small left pleural effusion. 5. Bilateral atelectasis. 6. Stable cardiomegaly. 7. Calcific coronary artery and aortic atherosclerosis. 8. Mild left apical paraseptal bullous changes. Aortic Atherosclerosis (ICD10-I70.0) and Emphysema (ICD10-J43.9). Electronically Signed   By: SClaudie ReveringM.D.   On: 08/29/2022 17:48   DG Chest 2 View  Result Date: 08/29/2022 CLINICAL DATA:  Chest pain EXAM: CHEST - 2 VIEW COMPARISON:  08/25/2022, 08/23/2022 FINDINGS: Mitral valve prosthesis. Removal of previously noted left-sided central venous catheter. Cardiomegaly with central congestion. Interim patchy bilateral lower lung consolidations and mild airspace disease in the medial right upper lobe. Small bilateral pleural effusions. No visible pneumothorax. IMPRESSION: 1. Cardiomegaly with central vascular congestion and small bilateral pleural effusions. 2. Interim development of patchy bilateral lower lung consolidations and mild airspace disease in the medial right upper lobe, suspicious for bilateral pneumonia. Electronically Signed   By: KDonavan FoilM.D.   On: 08/29/2022 15:24    EKG: Independently reviewed.   [place] 126; nonspecific ST changes with no evidence of acute ischemia 1856 - Sinus tachycardia with rate 100; no evidence of acute ischemia   Labs on Admission: I have personally reviewed the available labs and imaging studies at the time of the admission.  Pertinent labs:    BUN 20/Creatinine 4.63/GFR 14 HS troponin 160, 148 Lactate 0.8 WBC 10.3 Hgb 9.0 COVID/flu negative   Assessment and Plan: Principal Problem:   Pleuritic chest pain Active Problems:   HIV disease ([place]  OSA (obstructive sleep apnea)   Mild intermittent asthma   ESRD (end stage renal disease) on dialysis ([place])   Hypothyroidism (acquired)   Essential  hypertension   PAF (paroxysmal atrial fibrillation) (HCC)   S/P MVR (mitral valve repair)    Chest pain -Patient with recent MVR presenting with chest pain -Pain appears to be pleuritic but is associated with SOB/DOE -Imaging done and unremarkable -He did have afib in presentation and this appears to be new, could be related to his presenting complaint (see below) -He had recent MVR and may be splinting because of pain; will add IS and encourage good pulmonary toilet -Additionally, he has ESRD and appears to be somewhat hypervolemic despite HD yesterday; will consult nephrology -CT surgery is consulting -Will admit to cardiac telemetry for now -Morphine is contraindicated in HD patients; will treat with oxycodone as needed  ESRD with hypervolemia -He reports having had CRRT while previously hospitalized -He went to HD yesterday but was unable to complete his session because the machine was clotting, per his report -He appears to be volume overloaded on exam; while the CXR was read as possible PNA, he has no infectious symptoms and this appears to be more likely related to edema -He is likely to benefit from additional HD, particularly since his next HD day is not until Monday -Nephrology prn order set utilized -Nephrology was notified that patient will need HD  -Continue Sensipar, Auryxia, calcitriol  Afib -Rate controlled with amiodarone, metoprolol  HIV -Continue Biktarvy -VL undetectable in 03/2022 -CD4 was 486 on 3/20 (viral load was detectable at that time)  HLD -Continue rosuvastatin  HTN -Continue amlodipine, metoprolol  OSA -Continue CPAP  Hypothyroidism -Continue Synthroid at current dose for now   Recent CVA -Acute CVA following MVR  Restless legs -Continue ropinirole  Asthma -Continue Claritin, Symbicort, prn albuterol (on Xopenex at home)    Advance Care Planning:   Code Status: Full Code   Consults: CT surgery; nephrology  DVT Prophylaxis:  Heparin  Family Communication: 4 male family members were present throughout evaluation  Severity of Illness: The appropriate patient status for this patient is INPATIENT. Inpatient status is judged to be reasonable and necessary in order to provide the required intensity of service to ensure the patient's safety. The patient's presenting symptoms, physical exam findings, and initial radiographic and laboratory data in the context of their chronic comorbidities is felt to place them at high risk for further clinical deterioration. Furthermore, it is not anticipated that the patient will be medically stable for discharge from the hospital within 2 midnights of admission.   * I certify that at the point of admission it is my clinical judgment that the patient will require inpatient hospital care spanning beyond 2 midnights from the point of admission due to high intensity of service, high risk for further deterioration and high frequency of surveillance required.*  Author: Karmen Bongo, MD 08/30/2022 6:37 PM  For on call review [email or web address].

## 2022-08-30 NOTE — Progress Notes (Signed)
[facility] Progress Note  Name: Albert Eaton MRN: [id number] DOB: 04-05-1966  Subjective: pt just dc'd on 11/01 after MV repair surgery complicated by resp arrest and L frontal CVA. He had brief CRRT then was extubated and resumed iHD. Pt dc'd 11/01 at around 92kg (down from 101kg), his prior dry wt was 85kg though. At OP HD his dry wt was raised to 92kg so he didn't have a lot taken off on Friday. Pt admitted, we are asked to see for dialysis.   Pt seen in room. No acute SOB at this time. No other c/o's. Had OP HD Thursday and Friday this week.    Vitals:  Vitals:   08/30/22 1100 08/30/22 1149 08/30/22 1156 08/30/22 1542  BP: 129/77   (!) 143/80  Pulse: 94 98    Resp: (!) 23 20    Temp:  98.9 F (37.2 C) 98.9 F (37.2 C)   TempSrc:  Oral Oral   SpO2: 93% 91%    Weight:      Height:         Physical Exam:   General: adult male in bed in NAD , nasal O2 HEENT: NCAT Eyes: EOMI sclera anicteric Neck: supple trachea midline  Heart: S1S2  Lungs: clear  Abdomen: soft/nt/obese habitus  Extremities: no pitting edema lower extremities Neuro: alert and oriented x 3  LUA AVF+bruit  Outpatient HD orders:  [place] MWF  4h  400/ 800  3K/2.5Ca  85kg  AVF   Hep 3000  - pending   Assessment/Plan:  SOB/ chest pain - CXR borderline congestion, + mild rales on exam. Will plan extra HD tonight, try for UF 3.5 L. Lower dry wt if tolerates vol removal.   Severe mitral regurgitation - s/p right mini thoroctomy  w/ MV annuloplasty 10/26 Recent acute CVA - L frontal, after recent MV surgery HTN - BP's wnl here. Taking norvasc, metoprolol at home. Continue meds, hold for SBP < 120.  Hepatitis B - HD in isolation room upstairs Anemia CKD- Hb 9s, get records BMD ckd - CCa and phos in range, get records, cont binders and sensipar   Kelly Splinter, MD 08/25/2022, 9:00 AM  Recent Labs  Lab 08/25/22 0451 08/26/22 0439 08/27/22 0419 08/29/22 1505  HGB 8.9* 8.5* 8.5* 9.0*   ALBUMIN 3.0* 2.8*  --   --   CALCIUM 8.6* 8.8* 8.8* 9.6  PHOS 3.1 4.7*  --   --   CREATININE 3.66* 6.35* 5.54* 4.63*  K 3.9 4.0 3.8 4.0     Inpatient medications:  amiodarone  200 mg Oral BID   [START ON 08/31/2022] amLODipine  10 mg Oral QPC lunch   aspirin EC  81 mg Oral Daily   bictegravir-emtricitabine-tenofovir AF  1 tablet Oral Daily   [START ON 09/01/2022] calcitRIOL  1.75 mcg Oral Q M,W,F-HD   [START ON 08/31/2022] Chlorhexidine Gluconate Cloth  6 each Topical Q0600   cinacalcet  60 mg Oral Q supper   DIALYVITE TABLET   Oral Daily   ferric citrate  420 mg Oral TID with meals   heparin  5,000 Units Subcutaneous Q8H   [START ON 08/31/2022] levothyroxine  25 mcg Oral QAC breakfast   loratadine  10 mg Oral Daily   lubiprostone  24 mcg Oral BID WC   metoprolol tartrate  25 mg Oral BID   mometasone-formoterol  2 puff Inhalation BID   pantoprazole  40 mg Oral QHS   rOPINIRole  1 mg Oral QHS  rosuvastatin  10 mg Oral Daily   sodium chloride flush  3 mL Intravenous Q12H     acetaminophen **OR** acetaminophen, albuterol, calcium carbonate (dosed in mg elemental calcium), camphor-menthol **AND** hydrOXYzine, docusate sodium, feeding supplement (NEPRO CARB STEADY), gabapentin, hydrALAZINE, Olopatadine HCl, ondansetron **OR** ondansetron (ZOFRAN) IV, oxyCODONE, sorbitol, zolpidem

## 2022-08-30 NOTE — ED Notes (Signed)
Checked on pt , placed his clothes and shoes in a bag,  cleaned up in room and gave pt some ice water

## 2022-08-30 NOTE — Progress Notes (Signed)
      [address]       [place] [address]             [phone]        Subjective: Feels better this AM, still some chest wall pain, but much improved  Objective: Vital signs in last 24 hours: Temp:  [98.9 F (37.2 C)-100.6 F (38.1 C)] 98.9 F (37.2 C) (11/04 1149) Pulse Rate:  [70-106] 98 (11/04 1149) Cardiac Rhythm: Normal sinus rhythm (11/04 0100) Resp:  [11-33] 20 (11/04 1149) BP: (90-156)/(63-135) 129/77 (11/04 1100) SpO2:  [89 %-100 %] 91 % (11/04 1149) Weight:  [92.5 kg] 92.5 kg (11/03 1457)  Hemodynamic parameters for last 24 hours:    Intake/Output from previous day: 11/03 0701 - 11/04 0700 In: 258.3 [IV Piggyback:258.3] Out: -  Intake/Output this shift: No intake/output data recorded.  General appearance: alert, cooperative, and no distress Neurologic: intact Heart: Regular, faint systolic M RUSB, no MR murmur Lungs: diminished breath sounds right base Wound: clean and dry  Lab Results: Recent Labs    08/29/22 1505  WBC 10.3  HGB 9.0*  HCT 27.5*  PLT 272   BMET:  Recent Labs    08/29/22 1505  NA 139  K 4.0  CL 95*  CO2 29  GLUCOSE 100*  BUN 20  CREATININE 4.63*  CALCIUM 9.6    PT/INR: No results for input(s): "LABPROT", "INR" in the last 72 hours. ABG    Component Value Date/Time   PHART 7.390 08/22/2022 0459   HCO3 19.5 (L) 08/22/2022 0459   TCO2 20 (L) 08/22/2022 0459   ACIDBASEDEF 5.0 (H) 08/22/2022 0459   O2SAT 99 08/22/2022 0459   CBG (last 3)  No results for input(s): "GLUCAP" in the last 72 hours.  Assessment/Plan:  56 yo man with a history of hypertension, ESRD on HD, Crohn's, gout, HIV, secondary pulmonary HTN, CHF, SVT, CVA and mitral regurgitation s/p MV repair on 10/26.  Presented to [facility] yesterday with c/o R sided chest pain and shortness of breath.  Was noted to have low grade temp in ED.  CXR suspicious for pneumonia.  CT chest showed postop changes right chest wall and multi-loculated  right pleural effusion.  Started on azithromycin and ceftriaxone for presumed pneumonia.  Pain treated with IV morphine.  Suspect incisional pain.  Shortness of breath likely multifactorial incuding reent surgery, splinting due to pain and fluid overload with incomplete dialysis.  His multiloculated effusion is secondary to recent MR repair via right minithoracotomy.  Low grade fever likely atelectasis with normal WBC. Will defer to medicine as to antibiotics  HD per Nephrology Pain control with PO meds, morphine IV for severe pain Will follow    LOS: 0 days    Melrose Nakayama 08/30/2022

## 2022-08-31 DIAGNOSIS — R0781 Pleurodynia: Secondary | ICD-10-CM | POA: Diagnosis not present

## 2022-08-31 LAB — BASIC METABOLIC PANEL
Anion gap: 16 — ABNORMAL HIGH (ref 5–15)
BUN: 15 mg/dL (ref 6–20)
CO2: 27 mmol/L (ref 22–32)
Calcium: 9.2 mg/dL (ref 8.9–10.3)
Chloride: 93 mmol/L — ABNORMAL LOW (ref 98–111)
Creatinine, Ser: 3.62 mg/dL — ABNORMAL HIGH (ref 0.61–1.24)
GFR, Estimated: 19 mL/min — ABNORMAL LOW (ref >60)
Glucose, Bld: 93 mg/dL (ref 70–99)
Potassium: 3.4 mmol/L — ABNORMAL LOW (ref 3.5–5.1)
Sodium: 136 mmol/L (ref 135–145)

## 2022-08-31 LAB — CBC
HCT: 25.8 % — ABNORMAL LOW (ref 39.0–52.0)
Hemoglobin: 8.6 g/dL — ABNORMAL LOW (ref 13.0–17.0)
MCH: 30.9 pg (ref 26.0–34.0)
MCHC: 33.3 g/dL (ref 30.0–36.0)
MCV: 92.8 fL (ref 80.0–100.0)
Platelets: 333 10*3/uL (ref 150–400)
RBC: 2.78 MIL/uL — ABNORMAL LOW (ref 4.22–5.81)
RDW: 15.6 % — ABNORMAL HIGH (ref 11.5–15.5)
WBC: 9.4 10*3/uL (ref 4.0–10.5)
nRBC: 0 % (ref 0.0–0.2)

## 2022-08-31 LAB — HEPATITIS B SURFACE ANTIGEN: Hepatitis B Surface Ag: REACTIVE — AB

## 2022-08-31 MED ORDER — APIXABAN 5 MG PO TABS: 5.0000 mg | ORAL_TABLET | Freq: Two times a day (BID) | ORAL | 1 refills | Status: DC

## 2022-08-31 MED ORDER — AMOXICILLIN 500 MG PO CAPS: 500.0000 mg | ORAL_CAPSULE | Freq: Every day | ORAL | 0 refills | Status: AC

## 2022-08-31 MED ORDER — DOXYCYCLINE HYCLATE 100 MG PO CAPS: 100.0000 mg | ORAL_CAPSULE | Freq: Two times a day (BID) | ORAL | 0 refills | Status: AC

## 2022-08-31 NOTE — Progress Notes (Signed)
Patient heart rate elevated to 180s while ambulating to bathroom. EKG complete and confirmed sinus tach. Patient did recover. Aileen Fass, MD paged and notified. Will continue to monitor.

## 2022-08-31 NOTE — TOC Transition Note (Signed)
Transition of Care [facility]) - CM/SW Discharge Note   Patient Details  Name: Albert Eaton MRN: [id number] Date of Birth: 1966/07/05  Transition of [facility]) CM/SW Contact:  Tom-Johnson, Renea Ee, RN Phone Number: 08/31/2022, 1:28 PM   Clinical Narrative:     Patient is scheduled for discharge today. Family to transport at discharge. No TOC need or recommendations noted. No further TOC needs noted.   Final next level of care: Home/Self Care Barriers to Discharge: Barriers Resolved   Patient Goals and CMS Choice Patient states their goals for this hospitalization and ongoing recovery are:: To return home CMS Medicare.gov Compare Post Acute Care list provided to:: Patient Choice offered to / list presented to : NA  Discharge Placement                Patient to be transferred to facility by: Family      Discharge Plan and Services                DME Arranged: N/A         HH Arranged: NA [place] Agency: NA        Social Determinants of Health ([place]) Interventions     Readmission Risk Interventions    08/22/2022   11:54 AM 09/05/2021   11:20 AM 09/04/2020    9:24 AM  Readmission Risk Prevention Plan  Transportation Screening Complete Complete Complete  Medication Review Press photographer) Complete Complete Complete  PCP or Specialist appointment within 3-5 days of discharge  Complete Complete  HRI or Home Care Consult Complete Complete Complete  SW Recovery Care/Counseling Consult Complete Complete Complete  Palliative Care Screening Not Applicable Not Applicable Not [place] Not Applicable Not Applicable Not Applicable

## 2022-08-31 NOTE — Progress Notes (Signed)
[facility] Progress Note   Subjective:  Seen in room - dialyzed overnight with 3.5L off. CP and dyspnea better.  Objective Vitals:   08/31/22 0400 08/31/22 0538 08/31/22 0745 08/31/22 0813  BP:  133/80  128/83  Pulse:  (!) 109  (!) 105  Resp: 18 18    Temp:  99.9 F (37.7 C)    TempSrc:  Oral    SpO2:  96% 96%   Weight:      Height:       Physical Exam General: Well appearing man, NAD. Room air. Heart: RRR; no murmur Lungs: CTA anteriorly Abdomen: soft Extremities: No LE edema Dialysis Access: L AVF + thrill  Additional Objective Labs: Basic Metabolic Panel: Recent Labs  Lab 08/24/22 1520 08/25/22 0451 08/26/22 0439 08/27/22 0419 08/29/22 1505 08/31/22 0218  NA 136 137 134* 136 139 136  K 4.0 3.9 4.0 3.8 4.0 3.4*  CL 102 103 98 95* 95* 93*  CO2 25 25 23 28 29 27   GLUCOSE 103* 104* 119* 95 100* 93  BUN 22* 19 34* 25* 20 15  CREATININE 4.02* 3.66* 6.35* 5.54* 4.63* 3.62*  CALCIUM 8.5* 8.6* 8.8* 8.8* 9.6 9.2  PHOS 2.1* 3.1 4.7*  --   --   --    Liver Function Tests: Recent Labs  Lab 08/24/22 1520 08/25/22 0451 08/26/22 0439  ALBUMIN 3.2* 3.0* 2.8*   CBC: Recent Labs  Lab 08/25/22 0451 08/26/22 0439 08/27/22 0419 08/29/22 1505 08/31/22 0218  WBC 8.4 9.0 7.3 10.3 9.4  HGB 8.9* 8.5* 8.5* 9.0* 8.6*  HCT 26.8* 25.1* 25.9* 27.5* 25.8*  MCV 96.1 95.4 95.2 95.5 92.8  PLT 118* 128* 156 272 333   Blood Culture    Component Value Date/Time   SDES  08/29/2022 1635    BLOOD RIGHT FOREARM Performed at [facility], [place] [address], [place], [place] [address]    [facility]  08/29/2022 1635    Blood Culture adequate volume BOTTLES DRAWN AEROBIC AND ANAEROBIC Performed at [facility], [address] [place], [place] [address]    CULT  08/29/2022 1635    NO GROWTH 2 DAYS Performed at [facility] [address], [place], [place] [address]    REPTSTATUS PENDING 08/29/2022 1635   Studies/Results: CT  Angio Chest PE W/Cm &/Or Wo Cm  Result Date: 08/29/2022 CLINICAL DATA:  Chest pain and shortness of breath. Chest pain is pleuritic. Possible pneumonia. Hypoxia. Mitral valve replacement 8 days ago. The chest pain is pleuritic. EXAM: CT ANGIOGRAPHY CHEST WITH CONTRAST TECHNIQUE: Multidetector CT imaging of the chest was performed using the standard protocol during bolus administration of intravenous contrast. Multiplanar CT image reconstructions and MIPs were obtained to evaluate the vascular anatomy. RADIATION DOSE REDUCTION: This exam was performed according to the departmental dose-optimization program which includes automated exposure control, adjustment of the mA and/or kV according to patient size and/or use of iterative reconstruction technique. CONTRAST:  29m OMNIPAQUE IOHEXOL 350 MG/ML SOLN COMPARISON:  Chest radiographs obtained earlier today. Chest CTA dated 02/11/2021. FINDINGS: Cardiovascular: Stable enlarged heart. Minimal pericardial fluid with a maximum thickness of 5 mm. Atheromatous calcifications, including the coronary arteries and aorta. Normally opacified pulmonary arteries with no pulmonary arterial filling defects seen. Mediastinum/Nodes: No enlarged mediastinal, hilar, or axillary lymph nodes. Thyroid gland, trachea, and esophagus demonstrate no significant findings. Lungs/Pleura: Small to moderate-sized right pleural effusion with multiple loculations. Small left pleural effusion. Bilateral lower lobe atelectasis, greater on the right. Small  amount of atelectasis in the lingula and right middle lobe and in the posterior aspects of both upper lobes. No separate airspace consolidation on either side. Mild left apical paraseptal bullous changes. Upper Abdomen: Cholecystectomy clips. Two small calcified granulomata in the spleen. Musculoskeletal: Mild thoracic and lower cervical spine degenerative changes. These include Schmorl's nodes at multiple levels. Tiny amount of air in the pectoralis  major muscle on the right. Interval oval, low medium density mass or fluid collection within and/or between the pectoralis muscles laterally on the right measuring approximately 6.6 x 1.9 cm and 13 Hounsfield units in density on image number 103/5. There is also a small surgical clip the posterior aspect of this process on image number 89/5 and mild irregular underlying pleural thickening. Review of the MIP images confirms the above findings. IMPRESSION: 1. No pulmonary emboli. 2. Interval right anterolateral chest wall postsurgical changes including a small amount postoperative air in the pectoralis musculature on the right as well as a probable postoperative hematoma within the pectoralis musculature on the right. This is compatible with changes related to the patient's recent mitral valve replacement. 3. Interval small to moderate-sized right pleural effusion with multiple loculations. 4. Interval small left pleural effusion. 5. Bilateral atelectasis. 6. Stable cardiomegaly. 7. Calcific coronary artery and aortic atherosclerosis. 8. Mild left apical paraseptal bullous changes. Aortic Atherosclerosis (ICD10-I70.0) and Emphysema (ICD10-J43.9). Electronically Signed   By: Claudie Revering M.D.   On: 08/29/2022 17:48   DG Chest 2 View  Result Date: 08/29/2022 CLINICAL DATA:  Chest pain EXAM: CHEST - 2 VIEW COMPARISON:  08/25/2022, 08/23/2022 FINDINGS: Mitral valve prosthesis. Removal of previously noted left-sided central venous catheter. Cardiomegaly with central congestion. Interim patchy bilateral lower lung consolidations and mild airspace disease in the medial right upper lobe. Small bilateral pleural effusions. No visible pneumothorax. IMPRESSION: 1. Cardiomegaly with central vascular congestion and small bilateral pleural effusions. 2. Interim development of patchy bilateral lower lung consolidations and mild airspace disease in the medial right upper lobe, suspicious for bilateral pneumonia. Electronically  Signed   By: Donavan Foil M.D.   On: 08/29/2022 15:24   Medications:   amiodarone  200 mg Oral BID   [START ON 09/06/2022] amiodarone  200 mg Oral Daily   amLODipine  10 mg Oral QPC lunch   aspirin EC  81 mg Oral Daily   bictegravir-emtricitabine-tenofovir AF  1 tablet Oral Daily   [START ON 09/01/2022] calcitRIOL  1.75 mcg Oral Q M,W,F-HD   Chlorhexidine Gluconate Cloth  6 each Topical Q0600   cinacalcet  60 mg Oral Q supper   ferric citrate  420 mg Oral TID with meals   heparin  5,000 Units Subcutaneous Q8H   levothyroxine  25 mcg Oral QAC breakfast   loratadine  10 mg Oral Daily   lubiprostone  24 mcg Oral BID WC   metoprolol tartrate  25 mg Oral BID   mometasone-formoterol  2 puff Inhalation BID   multivitamin  1 tablet Oral Daily   pantoprazole  40 mg Oral QHS   rOPINIRole  1 mg Oral QHS   rosuvastatin  10 mg Oral Daily   sodium chloride flush  3 mL Intravenous Q12H    Dialysis Orders: 4hr, 400/500, EDW 3K/2.5Ca, AVF, 3K/2.5Ca, EDW 90 - heparin 3000 - sensipar 30 q HD, mircera 140mg q 2 wks, calc 1.75kg   Assessment/Plan:  SOB/chest pain - CXR borderline congestion, + mild rales on exam. Dialyzed overnight - 3.5L off. Symptoms improved today. EDW raised after  last admit -- likely was too high, will lower back to 87kg. ESRD: Usual MWF schedule - next due for HD tomorrow, either here or as outpatient. Severe mitral regurgitation s/p right mini thoroctomy  w/ MV annuloplasty 08/21/22 Recent acute CVA - L frontal, after recent MV surgery HTN: BP stable here. Taking norvasc, metoprolol at home. Continue meds, hold for SBP < 120.  Hepatitis B - HD in isolation room upstairs Anemia of ESRD: Hgb 8.6 -  resume ESA as outpatient. Secondary HPTH: CCa and phos in range, continue binders and sensipar Ok for discharge from renal standpoint.  Veneta Penton, PA-C 08/31/2022, 11:21 AM  [employer]

## 2022-08-31 NOTE — Progress Notes (Signed)
   08/31/22 0245  Vitals  Temp 99.2 F (37.3 C)  Temp Source Oral  BP 135/85  MAP (mmHg) 99  BP Location Left Arm  BP Method Automatic  Patient Position (if appropriate) Lying  Pulse Rate (!) 109  Pulse Rate Source Monitor  ECG Heart Rate (!) 108  Resp (!) 24  Post Treatment  Dialyzer Clearance Heavily streaked  Duration of HD Treatment -hour(s) 3.75 hour(s)  Liters Processed 90  Fluid Removed 3500 mL  Tolerated HD Treatment Yes  AVG/AVF Arterial Site Held (minutes) 5 minutes  AVG/AVF Venous Site Held (minutes) 5 minutes   TX fin.w/o difficulty in room.

## 2022-08-31 NOTE — Plan of Care (Signed)
  Problem: Education: Goal: Will demonstrate proper wound care and an understanding of methods to prevent future damage Outcome: Adequate for Discharge Goal: Knowledge of disease or condition will improve Outcome: Adequate for Discharge Goal: Knowledge of the prescribed therapeutic regimen will improve Outcome: Adequate for Discharge Goal: Individualized Educational Video(s) Outcome: Adequate for Discharge   Problem: Activity: Goal: Risk for activity intolerance will decrease Outcome: Adequate for Discharge   Problem: Cardiac: Goal: Will achieve and/or maintain hemodynamic stability Outcome: Adequate for Discharge   Problem: Clinical Measurements: Goal: Postoperative complications will be avoided or minimized Outcome: Adequate for Discharge   Problem: Respiratory: Goal: Respiratory status will improve Outcome: Adequate for Discharge   Problem: Skin Integrity: Goal: Wound healing without signs and symptoms of infection Outcome: Adequate for Discharge Goal: Risk for impaired skin integrity will decrease Outcome: Adequate for Discharge   Problem: Urinary Elimination: Goal: Ability to achieve and maintain adequate renal perfusion and functioning will improve Outcome: Adequate for Discharge   Problem: Education: Goal: Understanding of CV disease, CV risk reduction, and recovery process will improve Outcome: Adequate for Discharge Goal: Individualized Educational Video(s) Outcome: Adequate for Discharge   Problem: Activity: Goal: Ability to return to baseline activity level will improve Outcome: Adequate for Discharge   Problem: Cardiovascular: Goal: Ability to achieve and maintain adequate cardiovascular perfusion will improve Outcome: Adequate for Discharge Goal: Vascular access site(s) Level 0-1 will be maintained Outcome: Adequate for Discharge   Problem: Health Behavior/Discharge Planning: Goal: Ability to safely manage health-related needs after discharge will  improve Outcome: Adequate for Discharge   Problem: Education: Goal: Knowledge of cardiac device and self-care will improve Outcome: Adequate for Discharge Goal: Ability to safely manage health related needs after discharge will improve Outcome: Adequate for Discharge Goal: Individualized Educational Video(s) Outcome: Adequate for Discharge   Problem: Cardiac: Goal: Ability to achieve and maintain adequate cardiopulmonary perfusion will improve Outcome: Adequate for Discharge   Problem: Education: Goal: Knowledge of General Education information will improve Description: Including pain rating scale, medication(s)/side effects and non-pharmacologic comfort measures Outcome: Adequate for Discharge   Problem: Health Behavior/Discharge Planning: Goal: Ability to manage health-related needs will improve Outcome: Adequate for Discharge   Problem: Clinical Measurements: Goal: Ability to maintain clinical measurements within normal limits will improve Outcome: Adequate for Discharge Goal: Will remain free from infection Outcome: Adequate for Discharge Goal: Diagnostic test results will improve Outcome: Adequate for Discharge Goal: Respiratory complications will improve Outcome: Adequate for Discharge Goal: Cardiovascular complication will be avoided Outcome: Adequate for Discharge   Problem: Activity: Goal: Risk for activity intolerance will decrease Outcome: Adequate for Discharge   Problem: Nutrition: Goal: Adequate nutrition will be maintained Outcome: Adequate for Discharge   Problem: Coping: Goal: Level of anxiety will decrease Outcome: Adequate for Discharge   Problem: Elimination: Goal: Will not experience complications related to bowel motility Outcome: Adequate for Discharge Goal: Will not experience complications related to urinary retention Outcome: Adequate for Discharge   Problem: Pain Managment: Goal: General experience of comfort will improve Outcome:  Adequate for Discharge   Problem: Safety: Goal: Ability to remain free from injury will improve Outcome: Adequate for Discharge   Problem: Skin Integrity: Goal: Risk for impaired skin integrity will decrease Outcome: Adequate for Discharge

## 2022-08-31 NOTE — Progress Notes (Addendum)
      [address]       [place] [address]             [phone]         Subjective: Sitting up in bed talking on the phone, says he had a good night.  Soreness has improved significantly and he does not feel short of breath.  He is currently on room air.  Hemodialysis yesterday with 3.5 L removed.  Objective: Vital signs in last 24 hours: Temp:  [98.9 F (37.2 C)-99.9 F (37.7 C)] 99.9 F (37.7 C) (11/05 0538) Pulse Rate:  [89-109] 105 (11/05 0813) Cardiac Rhythm: Sinus tachycardia (11/05 0700) Resp:  [11-24] 18 (11/05 0538) BP: (114-167)/(65-99) 128/83 (11/05 0813) SpO2:  [91 %-100 %] 96 % (11/05 0745) Weight:  [85.5 kg-92.5 kg] 89 kg (11/05 0245)  Hemodynamic parameters for last 24 hours:    Intake/Output from previous day: 11/04 0701 - 11/05 0700 In: 240 [P.O.:240] Out: 3500  Intake/Output this shift: No intake/output data recorded.  General appearance: alert, cooperative, and no distress Neurologic: intact Heart: Sinus tachycardia.  4-2+ systolic murmur heard along the left sternal border Lungs: Normal work of breathing, oxygen saturation 96% on room air.  Breath sounds are clear.  Chest x-ray is pending. Extremities: No peripheral edema Wound: The right chest incision is well approximated and dry as are the chest tube sites.  Lab Results: Recent Labs    08/29/22 1505 08/31/22 0218  WBC 10.3 9.4  HGB 9.0* 8.6*  HCT 27.5* 25.8*  PLT 272 333   BMET:  Recent Labs    08/29/22 1505 08/31/22 0218  NA 139 136  K 4.0 3.4*  CL 95* 93*  CO2 29 27  GLUCOSE 100* 93  BUN 20 15  CREATININE 4.63* 3.62*  CALCIUM 9.6 9.2    PT/INR: No results for input(s): "LABPROT", "INR" in the last 72 hours. ABG    Component Value Date/Time   PHART 7.390 08/22/2022 0459   HCO3 19.5 (L) 08/22/2022 0459   TCO2 20 (L) 08/22/2022 0459   ACIDBASEDEF 5.0 (H) 08/22/2022 0459   O2SAT 99 08/22/2022 0459   CBG (last 3)  No results for input(s): "GLUCAP"  in the last 72 hours.  Assessment/Plan:   Chest pain and shortness of breath have significantly improved since admission.  Dialyzed yesterday with 3.5 L of fluid removed.  No evidence of heart failure on exam.  Had small, multiloculated effusions on CT scan.  No leukocytosis follow-up chest x-ray is pending.  Continue to encourage lung work and mobility.   LOS: 1 day    Antony Odea, [place] [phone] 08/31/2022  Patient seen and examined. Agree with above Feels much better Afebrile, normal WBC Ok to dc from our standpoint  If he stays tonight will get a CXR in AM, if dc'ed has follow up appointment with Dr. Lavonna Monarch this week  Revonda Standard. Roxan Hockey, MD [facility] [phone]

## 2022-08-31 NOTE — Discharge Summary (Signed)
Physician Discharge Summary  Marty Uy Ramanathan ERX:[id number] DOB: 09/12/1966 DOA: 08/29/2022  PCP: [name], NP  Admit date: 08/29/2022 Discharge date: 08/31/2022  Time spent: 40 minutes  Recommendations for Outpatient Follow-up:  Follow outpatient CBC/CMP  Follow with CT surgery as scheduled Follow with dialysis/renal as scheduled Started on eliquis due to flutter, follow in afib clinic Follow up chest imaging, R effusion outpatient per CT surgery  Discharge Diagnoses:  Principal Problem:   Pleuritic chest pain Active Problems:   HIV disease (HCC)   OSA (obstructive sleep apnea)   Mild intermittent asthma   ESRD (end stage renal disease) on dialysis ([place])   Hypothyroidism (acquired)   Essential hypertension   PAF (paroxysmal atrial fibrillation) ([place])   S/P MVR (mitral valve repair)   Discharge Condition: stable  Diet recommendation: renal diet  Filed Weights   08/30/22 2215 08/31/22 0220 08/31/22 0245  Weight: 92.5 kg 85.5 kg 89 kg    History of present illness:  Albert Eaton is Rane Dumm 56 y.o. male with medical history significant of chronic diastolic CHF; HIV; HTN; HLD; hypothyroidism; OSA on CPAP; and ESRD on MWF HD presenting with chest wall pain.   He was hospitalized from 10/26-11/2 on the CT surgery service for mitral valve repair on 10/26.  He was doing well post-operatively.  He was not having pain.  He started with issues on 11/3 about 0400.  He thought it was because he missed some pain medications.  He went to HD and went for Auset Fritzler walk but he was very SOB.  He laid on the bed and called the cardiology office -> CT surgery.  He was told to come in to be seen.  No cough.  SOB is persistent, even mildly with conversation.  The pain is slightly better but not as bad.    He did not fully complete HD yesterday because his machine clotted, but he did get to the goal for removing fluid.    He was on CRRT while hospitalized.   He was admitted with chest pain and shortness of  breath.  His symptoms improved after dialysis.  He had fever at presentation and imaging concerning for possible pneumonia, he was discharged on antibiotics.  EKG notable for flutter as well and started on eliquis at discharge.  See below for additional details   Hospital Course:  Assessment and Plan: Chest pain -Patient with recent MVR presenting with chest pain -Pain appears to be pleuritic but is associated with SOB/DOE -CT PE protocol with postsurgical changes, small to moderate sized R effusion.  Small L effusion.  Bilateral atelectasis.  - improved after dialysis, suspect due to volume overload - flutter with RVR/SVT may have contributed to his symptoms. - CT surgery notes ok to discharge today   ESRD with hypervolemia -He reports having had CRRT while previously hospitalized -He went to HD yesterday but was unable to complete his session prior to admission  -improved after dialysis yesterday, ok for discharge today from renal standpoint  Atrial Flutter with RVR  SVT - EKG from 11/3 with flutter with RVR - SVT 11/5 AM, now rate controlled with amiodarone, metoprolol - chadvasc at least 3 (HTN, stroke), warrants anticoagulation, discussed with cards on call.  Will start eliquis, refer to afib clinic.   HIV -Continue Biktarvy -VL undetectable in 03/2022 -CD4 was 486 on 3/20 (viral load was detectable at that time)   HLD -Continue rosuvastatin   HTN -Continue amlodipine, metoprolol   OSA -Continue CPAP  Hypothyroidism -Continue Synthroid at current dose for now    Recent CVA -Acute CVA following MVR -starting eliquis as above with flutter   Restless legs -Continue ropinirole   Asthma -Continue Claritin, Symbicort, prn albuterol (on Xopenex at home)     Procedures: none   Consultations: CT surgery nephrology  Discharge Exam: Vitals:   08/31/22 0813 08/31/22 1226  BP: 128/83 108/75  Pulse: (!) 105   Resp:  16  Temp:  98.7 F (37.1 C)  SpO2:      Eager to discharge No cp or SOB, feels better than at admission  General: No acute distress. Cardiovascular: RRR Lungs: unlabored Abdomen: Soft, nontender, nondistended  Neurological: Alert and oriented 3. Moves all extremities 4 with equal strength. Cranial nerves II through XII grossly intact. Extremities: No clubbing or cyanosis. No edema.  Discharge Instructions   Discharge Instructions     (HEART FAILURE PATIENTS) Call MD:  Anytime you have any of the following symptoms: 1) 3 pound weight gain in 24 hours or 5 pounds in 1 week 2) shortness of breath, with or without Kashia Brossard dry hacking cough 3) swelling in the hands, feet or stomach 4) if you have to sleep on extra pillows at night in order to breathe.   Complete by: As directed    Call MD for:  difficulty breathing, headache or visual disturbances   Complete by: As directed    Call MD for:  extreme fatigue   Complete by: As directed    Call MD for:  hives   Complete by: As directed    Call MD for:  persistant dizziness or light-headedness   Complete by: As directed    Call MD for:  persistant nausea and vomiting   Complete by: As directed    Call MD for:  redness, tenderness, or signs of infection (pain, swelling, redness, odor or green/yellow discharge around incision site)   Complete by: As directed    Call MD for:  severe uncontrolled pain   Complete by: As directed    Call MD for:  temperature >100.4   Complete by: As directed    Diet - low sodium heart healthy   Complete by: As directed    Discharge instructions   Complete by: As directed    You were seen for chest pain and shortness of breath.  I believe this was due to volume overload.  You've improved after dialysis.  Your CT scan showed evidence of pleural fluid related to your recent surgery, but also the question of pneumonia.  We'll send you out on antibiotics since you had Ernesto Lashway fever when you first got here.  Follow up outpatient with CT surgery as scheduled.     Continue to follow with dialysis outpatient as scheduled.  Return for new, recurrent, or worsening symptoms.  Please ask your PCP to request records from this hospitalization so they know what was done and what the next steps will be.   Increase activity slowly   Complete by: As directed    No wound care   Complete by: As directed       Allergies as of 08/31/2022       Reactions   Ace Inhibitors Cough, Other (See Comments)        Medication List     TAKE these medications    Acetaminophen Extra Strength 500 MG Tabs Take 1,000 mg by mouth every 6 (six) hours as needed (pain.).   amiodarone 200 MG tablet Commonly known as:  PACERONE Take 1 tablet (200 mg total) by mouth 2 (two) times daily. X 7 days, then decrease to 200 mg daily   amLODipine 10 MG tablet Commonly known as: NORVASC Take 1 tablet (10 mg total) by mouth daily. What changed: when to take this   amoxicillin 500 MG capsule Commonly known as: AMOXIL Take 1 capsule (500 mg total) by mouth daily for 5 days.   aspirin EC 81 MG tablet Take 1 tablet (81 mg total) by mouth daily. Swallow whole.   azelastine 0.1 % nasal spray Commonly known as: ASTELIN Place 2 sprays into both nostrils 2 (two) times daily as needed for allergies.   Biktarvy 50-200-25 MG Tabs tablet Generic drug: bictegravir-emtricitabine-tenofovir AF TAKE ONE TABLET BY MOUTH EVERY DAY   calcitRIOL 0.25 MCG capsule Commonly known as: ROCALTROL Take 7 capsules (1.75 mcg total) by mouth every Monday, Wednesday, and Friday with hemodialysis.   desloratadine 5 MG tablet Commonly known as: Clarinex Take 1 tablet (5 mg total) by mouth daily. What changed: when to take this   DIALYVITE 800-ZINC 15 PO Take 1 tablet by mouth daily.   DIALYVITE PO Take 1 tablet by mouth daily.   doxycycline 100 MG capsule Commonly known as: VIBRAMYCIN Take 1 capsule (100 mg total) by mouth 2 (two) times daily for 5 days.   famotidine 20 MG  tablet Commonly known as: PEPCID Take 1 tablet (20 mg total) by mouth 2 (two) times daily. What changed:  when to take this reasons to take this   ferric citrate 1 GM 210 MG(Fe) tablet Commonly known as: AURYXIA Take 420 mg by mouth See admin instructions. Take 420 mg by mouth three times Dariusz Brase day with meals and 210 mg with each snack   Flovent HFA 110 MCG/ACT inhaler Generic drug: fluticasone Inhale 2 puffs into the lungs 2 (two) times daily as needed ("for flares").   gabapentin 300 MG capsule Commonly known as: NEURONTIN TAKE 1 CAPSULE(300 MG) BY MOUTH THREE TIMES DAILY What changed: See the new instructions.   ipratropium 0.06 % nasal spray Commonly known as: ATROVENT USE 2 SPRAYS IN EACH NOSTRIL 2 TO 3 TIMES DAILY AS NEEDED What changed:  how much to take how to take this when to take this additional instructions   levalbuterol 45 MCG/ACT inhaler Commonly known as: XOPENEX HFA Inhale 2 puffs into the lungs every 4 (four) hours as needed for wheezing.   levothyroxine 25 MCG tablet Commonly known as: SYNTHROID Take 25 mcg by mouth daily before breakfast.   lubiprostone 24 MCG capsule Commonly known as: AMITIZA Take 1 capsule (24 mcg total) by mouth 2 (two) times daily with Ernesta Trabert meal. NEEDS OFFICE VISIT FOR FURTHER REFILLS   metoprolol tartrate 25 MG tablet Commonly known as: LOPRESSOR Take 1 tablet (25 mg total) by mouth 2 (two) times daily.   MIRCERA IJ Inject 1 each into the skin every 30 (thirty) days. Administered at dialysis center once Netta Fodge month   oxyCODONE 5 MG immediate release tablet Commonly known as: Oxy IR/ROXICODONE Take 1 tablet (5 mg total) by mouth every 4 (four) hours as needed for severe pain.   pantoprazole 40 MG tablet Commonly known as: PROTONIX Take 1 tablet (40 mg total) by mouth daily. **PLEASE CONTACT THE OFFICE TO SCHEDULE FOLLOW UP What changed:  when to take this additional instructions   Pataday 0.7 % Soln Generic drug: Olopatadine  HCl Place 1 drop into both eyes 2 (two) times daily as needed (allergies).   rOPINIRole  1 MG tablet Commonly known as: REQUIP TAKE ONE TABLET BY MOUTH AT BEDTIME   rosuvastatin 10 MG tablet Commonly known as: CRESTOR TAKE ONE TABLET BY MOUTH DAILY   Sensipar 60 MG tablet Generic drug: cinacalcet Take 60 mg by mouth See admin instructions. Take 60 mg by mouth with supper after dialysis treatments on Mon/Wed/Fri   Spacer/Aero-Holding [facility] 1 Device by Does not apply route 2 (two) times Ahsley Attwood day.   Symbicort 160-4.5 MCG/ACT inhaler Generic drug: budesonide-formoterol Inhale 2 puffs into the lungs daily.   Vitamin D (Ergocalciferol) 1.25 MG (50000 UNIT) Caps capsule Commonly known as: DRISDOL Take 50,000 Units by mouth every Tuesday.       Allergies  Allergen Reactions   Ace Inhibitors Cough and Other (See Comments)      The results of significant diagnostics from this hospitalization (including imaging, microbiology, ancillary and laboratory) are listed below for reference.    Significant Diagnostic Studies: CT Angio Chest PE W/Cm &/Or Wo Cm  Result Date: 08/29/2022 CLINICAL DATA:  Chest pain and shortness of breath. Chest pain is pleuritic. Possible pneumonia. Hypoxia. Mitral valve replacement 8 days ago. The chest pain is pleuritic. EXAM: CT ANGIOGRAPHY CHEST WITH CONTRAST TECHNIQUE: Multidetector CT imaging of the chest was performed using the standard protocol during bolus administration of intravenous contrast. Multiplanar CT image reconstructions and MIPs were obtained to evaluate the vascular anatomy. RADIATION DOSE REDUCTION: This exam was performed according to the departmental dose-optimization program which includes automated exposure control, adjustment of the mA and/or kV according to patient size and/or use of iterative reconstruction technique. CONTRAST:  75m OMNIPAQUE IOHEXOL 350 MG/ML SOLN COMPARISON:  Chest radiographs obtained earlier today. Chest CTA  dated 02/11/2021. FINDINGS: Cardiovascular: Stable enlarged heart. Minimal pericardial fluid with Broox Lonigro maximum thickness of 5 mm. Atheromatous calcifications, including the coronary arteries and aorta. Normally opacified pulmonary arteries with no pulmonary arterial filling defects seen. Mediastinum/Nodes: No enlarged mediastinal, hilar, or axillary lymph nodes. Thyroid gland, trachea, and esophagus demonstrate no significant findings. Lungs/Pleura: Small to moderate-sized right pleural effusion with multiple loculations. Small left pleural effusion. Bilateral lower lobe atelectasis, greater on the right. Small amount of atelectasis in the lingula and right middle lobe and in the posterior aspects of both upper lobes. No separate airspace consolidation on either side. Mild left apical paraseptal bullous changes. Upper Abdomen: Cholecystectomy clips. Two small calcified granulomata in the spleen. Musculoskeletal: Mild thoracic and lower cervical spine degenerative changes. These include Schmorl's nodes at multiple levels. Tiny amount of air in the pectoralis major muscle on the right. Interval oval, low medium density mass or fluid collection within and/or between the pectoralis muscles laterally on the right measuring approximately 6.6 x 1.9 cm and 13 Hounsfield units in density on image number 103/5. There is also Isley Zinni small surgical clip the posterior aspect of this process on image number 89/5 and mild irregular underlying pleural thickening. Review of the MIP images confirms the above findings. IMPRESSION: 1. No pulmonary emboli. 2. Interval right anterolateral chest wall postsurgical changes including Chon Buhl small amount postoperative air in the pectoralis musculature on the right as well as Shomari Scicchitano probable postoperative hematoma within the pectoralis musculature on the right. This is compatible with changes related to the patient's recent mitral valve replacement. 3. Interval small to moderate-sized right pleural effusion  with multiple loculations. 4. Interval small left pleural effusion. 5. Bilateral atelectasis. 6. Stable cardiomegaly. 7. Calcific coronary artery and aortic atherosclerosis. 8. Mild left apical paraseptal bullous changes. Aortic Atherosclerosis (  ICD10-I70.0) and Emphysema (ICD10-J43.9). Electronically Signed   By: Claudie Revering M.D.   On: 08/29/2022 17:48   DG Chest 2 View  Result Date: 08/29/2022 CLINICAL DATA:  Chest pain EXAM: CHEST - 2 VIEW COMPARISON:  08/25/2022, 08/23/2022 FINDINGS: Mitral valve prosthesis. Removal of previously noted left-sided central venous catheter. Cardiomegaly with central congestion. Interim patchy bilateral lower lung consolidations and mild airspace disease in the medial right upper lobe. Small bilateral pleural effusions. No visible pneumothorax. IMPRESSION: 1. Cardiomegaly with central vascular congestion and small bilateral pleural effusions. 2. Interim development of patchy bilateral lower lung consolidations and mild airspace disease in the medial right upper lobe, suspicious for bilateral pneumonia. Electronically Signed   By: Donavan Foil M.D.   On: 08/29/2022 15:24   DG Chest Port 1 View  Result Date: 08/25/2022 CLINICAL DATA:  Shortness of breath. EXAM: PORTABLE CHEST 1 VIEW COMPARISON:  August 23, 2022. FINDINGS: Stable cardiomegaly. Left lung is clear. Mild right basilar atelectasis or infiltrate is again noted and grossly stable. Bony thorax is unremarkable. Left internal jugular dialysis catheter is unchanged. IMPRESSION: Grossly stable mild right basilar atelectasis or infiltrate. Electronically Signed   By: Marijo Conception M.D.   On: 08/25/2022 08:27   DG CHEST PORT 1 VIEW  Result Date: 08/23/2022 CLINICAL DATA:  Respiratory failure EXAM: PORTABLE CHEST 1 VIEW COMPARISON:  August 22, 2022 FINDINGS: Support apparatus including Laquincy Eastridge PA catheter, Facundo Allemand left subclavian line, and chest tubes are stable. No pneumothorax. Stable cardiomegaly. The hila and mediastinum  are unchanged. Mild opacity in the right base is more pronounced in the interval. No overt edema. No other interval changes. IMPRESSION: 1. Support apparatus as above. 2. Mild opacity in the right base is more pronounced in the interval. This could represent atelectasis or developing infiltrate. Recommend attention on follow-up. Electronically Signed   By: Dorise Bullion III M.D.   On: 08/23/2022 08:25   CT HEAD WO CONTRAST (5MM)  Result Date: 08/22/2022 CLINICAL DATA:  Stroke follow-up EXAM: CT HEAD WITHOUT CONTRAST TECHNIQUE: Contiguous axial images were obtained from the base of the skull through the vertex without intravenous contrast. RADIATION DOSE REDUCTION: This exam was performed according to the departmental dose-optimization program which includes automated exposure control, adjustment of the mA and/or kV according to patient size and/or use of iterative reconstruction technique. COMPARISON:  08/21/2022 head CT and CTA FINDINGS: Brain: No new hypodensity to suggest evolving infarct, with particular attention paid to the anterior left frontal lobe area identified on the CT perfusion. No evidence of acute infarction, hemorrhage, mass, mass effect, or midline shift. No hydrocephalus or extra-axial fluid collection. Vascular: No hyperdense vessel. Atherosclerotic calcifications in the intracranial carotid and vertebral arteries. Skull: Normal. Negative for fracture or focal lesion. Sinuses/Orbits: No acute finding. Other: The mastoid air cells are well aerated. IMPRESSION: No acute intracranial process. No new hypodensity to suggest evolving infarct. Electronically Signed   By: Merilyn Baba M.D.   On: 08/22/2022 22:27   DG CHEST PORT 1 VIEW  Result Date: 08/22/2022 CLINICAL DATA:  Central line placement. EXAM: PORTABLE CHEST 1 VIEW COMPARISON:  Chest x-ray 08/22/2022 FINDINGS: Endotracheal tube has been removed. Right-sided chest tube is again seen. Swan-Ganz catheter tip at the level of the main  pulmonary artery. Left-sided central venous catheter tip in the distal SVC. Cardiomediastinal silhouette is enlarged unchanged. There is no focal lung infiltrate, pleural effusion or pneumothorax. No acute fractures are seen. IMPRESSION: 1. Endotracheal tube has been removed. 2. Swan-Ganz catheter  tip at the level of the main pulmonary artery. Electronically Signed   By: Ronney Asters M.D.   On: 08/22/2022 18:51   ECHOCARDIOGRAM COMPLETE BUBBLE STUDY  Result Date: 08/22/2022    ECHOCARDIOGRAM REPORT   Patient Name:   BROWNIE NEHME Bessler Date of Exam: 08/22/2022 Medical Rec #:  [id number]       Height:       70.0 in Accession #:    [id number]      Weight:       224.9 lb Date of Birth:  12-13-1965        BSA:          2.194 m Patient Age:    11 years        BP:           72/53 mmHg Patient Gender: M               HR:           81 bpm. Exam Location:  Inpatient Procedure: 2D Echo, Cardiac Doppler, Color Doppler and Saline Contrast Bubble            Study Indications:    Stroke 434.91 / I63.9  History:        Patient has prior history of Echocardiogram examinations, most                 recent 08/21/2022. CHF; Risk Factors:Sleep Apnea, Hypertension,                 Dyslipidemia and GERD.                  Mitral Valve: prosthetic annuloplasty ring valve is present in                 the mitral position.  Sonographer:    Bernadene Person RDCS Referring Phys: [id number] [place]  1. Post MV repair.  2. Left ventricular ejection fraction, by estimation, is 45 to 50%. The left ventricle has mildly decreased function. The left ventricle demonstrates global hypokinesis. There is mild left ventricular hypertrophy. Left ventricular diastolic parameters are consistent with Grade II diastolic dysfunction (pseudonormalization). There is the interventricular septum is flattened in systole and diastole, consistent with right ventricular pressure and volume overload.  3. Right ventricular systolic function is mildly  reduced. The right ventricular size is moderately enlarged. There is moderately elevated pulmonary artery systolic pressure. The estimated right ventricular systolic pressure is 72.5 mmHg.  4. Left atrial size was severely dilated.  5. Right atrial size was severely dilated.  6. The mitral valve has been repaired/replaced. Trivial mitral valve regurgitation. No evidence of mitral stenosis. Moderate mitral annular calcification. There is Issabella Rix prosthetic annuloplasty ring present in the mitral position.  7. Tricuspid valve regurgitation is severe.  8. The aortic valve is normal in structure. Aortic valve regurgitation is not visualized. No aortic stenosis is present.  9. The inferior vena cava is dilated in size with <50% respiratory variability, suggesting right atrial pressure of 15 mmHg. Conclusion(s)/Recommendation(s): No intracardiac source of embolism detected on this transthoracic study. Consider Jamisyn Langer transesophageal echocardiogram to exclude cardiac source of embolism if clinically indicated. FINDINGS  Left Ventricle: Left ventricular ejection fraction, by estimation, is 45 to 50%. The left ventricle has mildly decreased function. The left ventricle demonstrates global hypokinesis. The left ventricular internal cavity size was normal in size. There is  mild left ventricular hypertrophy. The interventricular septum is flattened in systole and diastole,  consistent with right ventricular pressure and volume overload. Left ventricular diastolic parameters are consistent with Grade II diastolic dysfunction  (pseudonormalization). Right Ventricle: The right ventricular size is moderately enlarged. No increase in right ventricular wall thickness. Right ventricular systolic function is mildly reduced. There is moderately elevated pulmonary artery systolic pressure. The tricuspid regurgitant velocity is 2.96 m/s, and with an assumed right atrial pressure of 15 mmHg, the estimated right ventricular systolic pressure is 69.4  mmHg. Left Atrium: Left atrial size was severely dilated. Right Atrium: Right atrial size was severely dilated. Pericardium: There is no evidence of pericardial effusion. Mitral Valve: The mitral valve has been repaired/replaced. Moderate mitral annular calcification. Trivial mitral valve regurgitation. There is Chanese Hartsough prosthetic annuloplasty ring present in the mitral position. No evidence of mitral valve stenosis. Tricuspid Valve: The tricuspid valve is normal in structure. Tricuspid valve regurgitation is severe. No evidence of tricuspid stenosis. Aortic Valve: The aortic valve is normal in structure. Aortic valve regurgitation is not visualized. No aortic stenosis is present. Pulmonic Valve: The pulmonic valve was normal in structure. Pulmonic valve regurgitation is mild. No evidence of pulmonic stenosis. Aorta: The aortic root is normal in size and structure. Venous: The inferior vena cava is dilated in size with less than 50% respiratory variability, suggesting right atrial pressure of 15 mmHg. IAS/Shunts: No atrial level shunt detected by color flow Doppler. Agitated saline contrast was given intravenously to evaluate for intracardiac shunting. Additional Comments: Post MV repair. Briggs Edelen device lead is visualized in the right ventricle.  LEFT VENTRICLE PLAX 2D LVIDd:         6.00 cm      Diastology LVIDs:         4.70 cm      LV e' medial:    6.60 cm/s LV PW:         1.20 cm      LV E/e' medial:  20.3 LV IVS:        1.20 cm      LV e' lateral:   11.20 cm/s LVOT diam:     2.10 cm      LV E/e' lateral: 12.0 LV SV:         71 LV SV Index:   33 LVOT Area:     3.46 cm  LV Volumes (MOD) LV vol d, MOD A2C: 118.0 ml LV vol d, MOD A4C: 154.0 ml LV vol s, MOD A2C: 52.1 ml LV vol s, MOD A4C: 50.5 ml LV SV MOD A2C:     65.9 ml LV SV MOD A4C:     154.0 ml LV SV MOD BP:      86.7 ml RIGHT VENTRICLE RV S prime:     11.00 cm/s TAPSE (M-mode): 1.5 cm LEFT ATRIUM             Index        RIGHT ATRIUM           Index LA diam:         4.80 cm 2.19 cm/m   RA Area:     26.50 cm LA Vol (A2C):   84.5 ml 38.52 ml/m  RA Volume:   86.90 ml  39.61 ml/m LA Vol (A4C):   75.7 ml 34.50 ml/m LA Biplane Vol: 81.1 ml 36.97 ml/m  AORTIC VALVE             PULMONIC VALVE LVOT Vmax:   123.00 cm/s PR End Diast Vel: 7.40 msec LVOT Vmean:  77.400 cm/s  LVOT VTI:    0.206 m  AORTA Ao Root diam: 4.00 cm Ao Asc diam:  3.90 cm MITRAL VALVE                TRICUSPID VALVE MV Area (PHT): 3.65 cm     TR Peak grad:   35.0 mmHg MV Decel Time: 208 msec     TR Vmax:        296.00 cm/s MV E velocity: 134.00 cm/s MV Bridey Brookover velocity: 73.20 cm/s   SHUNTS MV E/Chameka Mcmullen ratio:  1.83         Systemic VTI:  0.21 m                             Systemic Diam: 2.10 cm Candee Furbish MD Electronically signed by Candee Furbish MD Signature Date/Time: 08/22/2022/2:00:56 PM    Final    ECHO INTRAOPERATIVE TEE  Result Date: 08/22/2022  *INTRAOPERATIVE TRANSESOPHAGEAL REPORT *  Patient Name:   MUHAMED LUECKE Kolasa Date of Exam: 08/21/2022 Medical Rec #:  [id number]       Height:       70.0 in Accession #:    [id number]      Weight:       206.0 lb Date of Birth:  1966-09-21        BSA:          2.11 m Patient Age:    81 years        BP:           122/81 mmHg Patient Gender: M               HR:           77 bpm. Exam Location:  Inpatient Transesophogeal exam was perform intraoperatively during surgical procedure. Patient was closely monitored under general anesthesia during the entirety of examination. Indications:     mitral regurgitation Sonographer:     Johny Chess RDCS Performing Phys: [id number] Coralie Common Diagnosing Phys: Albertha Ghee MD Complications: No known complications during this procedure. POST-OP IMPRESSIONS _ Mitral Valve: No stenosis present. There is trace regurgitation. The gradient recorded across the prosthetic valve is within the expected range. The mean PG across the repaired mitral valve is 3 mmHg.The mitral valve is status post repair with an annuloplasty ring. PRE-OP FINDINGS  Left  Ventricle: The left ventricle has low normal systolic function, with an ejection fraction of 50-55%. The cavity size was normal. There is mildly increased left ventricular wall thickness. There is mild concentric left ventricular hypertrophy. Right Ventricle: The right ventricle has normal systolic function. The cavity was normal. There is no increase in right ventricular wall thickness. Left Atrium: Left atrial size was dilated. No left atrial/left atrial appendage thrombus was detected. Right Atrium: Right atrial size was dilated. Interatrial Septum: No atrial level shunt detected by color flow Doppler. Pericardium: There is no evidence of pericardial effusion. Mitral Valve: The mitral valve is dilated. Mitral valve regurgitation is severe by color flow Doppler. The MR jet is centrally-directed. There is No evidence of mitral stenosis. Mitral valve annulus is dilated with an anterior-posterior dimension of 4.12cm. Diameter in the commissural view is 3.76cm. Regurgitant jet is severe with Rosell Khouri dense signal on CWD. No flow reversal is noted in pulmonary veins. Tricuspid Valve: The tricuspid valve was normal in structure. Tricuspid valve regurgitation is moderate by color flow Doppler. The jet is directed centrally. No evidence of tricuspid stenosis is  present. Aortic Valve: The aortic valve is tricuspid Aortic valve regurgitation was not visualized by color flow Doppler. There is no stenosis of the aortic valve. Pulmonic Valve: The pulmonic valve was normal in structure. Pulmonic valve regurgitation is not visualized by color flow Doppler. Aorta: The aortic root, ascending aorta and aortic arch are normal in size and structure. +-------------+-----------++ +---------------+-----------++ MR Peak grad:79.2 mmHg   TRICUSPID VALVE            +-------------+-----------++ +---------------+-----------++ MR Mean grad:49.0 mmHg   TR Peak grad:  32.5 mmHg   +-------------+-----------++  +---------------+-----------++ MR Vmax:     445.00 cm/s TR Vmax:       285.00 cm/s +-------------+-----------++ +---------------+-----------++ MR Vmean:    324.0 cm/s  +-------------+-----------++  Albertha Ghee MD Electronically signed by Albertha Ghee MD Signature Date/Time: 08/22/2022/11:46:22 AM    Final    DG Chest Port 1 View  Result Date: 08/22/2022 CLINICAL DATA:  Post MVR EXAM: PORTABLE CHEST 1 VIEW COMPARISON:  Portable exam 0522 hours compared to 08/21/2022 FINDINGS: Tip of endotracheal tube projects 2.5 cm above carina. Nasogastric tube extends into stomach. RIGHT thoracostomy tubes present. RIGHT jugular Swan-Ganz catheter with tip projecting over RIGHT pulmonary artery. Epicardial pacing wires noted. Enlargement of cardiac silhouette post MVR. Bibasilar opacities likely atelectasis greater on LEFT. No pleural effusion or pneumothorax. IMPRESSION: Stable postoperative changes as above. Electronically Signed   By: Lavonia Dana M.D.   On: 08/22/2022 08:31   EEG adult  Result Date: 08/21/2022 Lora Havens, MD     08/21/2022  8:45 PM Patient Name: Christerpher Clos Garver MRN: [id number] Epilepsy Attending: Lora Havens Referring Physician/Provider: Candee Furbish, MD Date: 08/21/2022 Duration: 21.50 mins Patient history: 56yo M had myoclonus of all 4 ext then became unresponsive and stopped breathing. EEG to evaluate for seizure Level of alertness:  comatose AEDs during EEG study: None Technical aspects: This EEG study was done with scalp electrodes positioned according to the 10-20 International system of electrode placement. Electrical activity was reviewed with band pass filter of 1-70Hz , sensitivity of 7 uV/mm, display speed of 71m/sec with Giah Fickett 60Hz  notched filter applied as appropriate. EEG data were recorded continuously and digitally stored.  Video monitoring was available and reviewed as appropriate. Description: EEG showed continuous generalized mixed frequencies with  predominantly 3 to 6 Hz theta-delta slowing admixed with intermittent 8-9Hz  generalized alpha activity and 13-15hz  frontocentral beta activity. Hyperventilation and photic stimulation were not performed.   ABNORMALITY - Continuous slow, generalized IMPRESSION: This study is suggestive of severe diffuse encephalopathy, nonspecific etiology but could be related to sedation. No seizures or epileptiform discharges were seen throughout the recording. Please note that lack of epileptiform activity on interictal EEG does not exclude the diagnosis of epilepsy. PLora Havens  DG CHEST PORT 1 VIEW  Result Date: 08/21/2022 CLINICAL DATA:  Ventilator dependence. EXAM: PORTABLE CHEST 1 VIEW COMPARISON:  Earlier same day FINDINGS: 1815 hours. Endotracheal tube tip is 4.2 cm above the base of the carina. NG tube tip is superimposed on the heart. The course of the NG tube does not follow the airway anatomy suggesting that it tracks down the esophagus. Appearance may be related to high position of the left hemidiaphragm and is unchanged in the interval. Patient does not have Arienne Gartin hiatal hernia on prior MR of 10/30/2021. Right IJ pulmonary artery catheter tip is in the right main pulmonary artery. Right chest tube noted with no evidence for right-sided pneumothorax. Drain overlying the inferior  heart may be pericardial/mediastinal. Vascular congestion has increased in the interval with probable edema and basilar atelectasis evident. IMPRESSION: 1. Slight progression of vascular congestion and interstitial opacity suggesting edema. 2. NG tube tip is superimposed on the heart. The course of the NG tube does not follow the airway anatomy suggesting that it tracks down the esophagus. Positioning of the tip may be related to high left hemidiaphragm and is unchanged in the interval. If there is clinical concern for NG tube malposition, administration of Elwyn Klosinski small amount of contrast material through the tube with repeat x-ray may prove  helpful. 3. Endotracheal tube tip is 4.2 cm above the base of the carina. 4. Right chest tube without evidence for right pneumothorax. Electronically Signed   By: Misty Stanley M.D.   On: 08/21/2022 18:42   CT ANGIO HEAD NECK W WO CM W PERF (CODE STROKE)  Result Date: 08/21/2022 CLINICAL DATA:  Mitral valve replacement today. Acute neurologic deficit postop EXAM: CT ANGIOGRAPHY HEAD AND NECK CT PERFUSION BRAIN TECHNIQUE: Multidetector CT imaging of the head and neck was performed using the standard protocol during bolus administration of intravenous contrast. Multiplanar CT image reconstructions and MIPs were obtained to evaluate the vascular anatomy. Carotid stenosis measurements (when applicable) are obtained utilizing NASCET criteria, using the distal internal carotid diameter as the denominator. Multiphase CT imaging of the brain was performed following IV bolus contrast injection. Subsequent parametric perfusion maps were calculated using RAPID software. RADIATION DOSE REDUCTION: This exam was performed according to the departmental dose-optimization program which includes automated exposure control, adjustment of the mA and/or kV according to patient size and/or use of iterative reconstruction technique. CONTRAST:  126m OMNIPAQUE IOHEXOL 350 MG/ML SOLN COMPARISON:  CT head 08/21/2022 FINDINGS: CTA NECK FINDINGS Aortic arch: Mild atherosclerotic calcification aortic arch. Proximal great vessels widely patent. Right carotid system: Mild atherosclerotic calcification right carotid bulb. Negative for stenosis. Small caliber right internal carotid artery similar to that seen on the left. Left carotid system: Mild atherosclerotic calcification proximal left internal carotid artery without significant stenosis. Small caliber left internal carotid artery similar to the right side. Vertebral arteries: Both vertebral arteries patent to the basilar without stenosis. Skeleton: No acute skeletal abnormality Other  neck: The patient is intubated.  NG tube in the esophagus Upper chest: Right chest tube in place. Small right apical pneumothorax. Gas in the right chest wall from recent surgery. Mild atelectasis in the posterior lung apices bilaterally. Review of the MIP images confirms the above findings CTA HEAD FINDINGS Anterior circulation: Atherosclerotic calcification in the cavernous carotid bilaterally without stenosis. Anterior and middle cerebral arteries patent without stenosis or large vessel occlusion. Negative for aneurysm. Posterior circulation: Both vertebral arteries patent to the basilar without significant stenosis. Mild atherosclerotic calcification distal right vertebral artery without significant stenosis. PICA patent bilaterally. Basilar patent. Mild atherosclerotic calcification in the basilar. Superior cerebellar and posterior cerebral arteries patent bilaterally without stenosis. Venous sinuses: Normal venous enhancement Anatomic variants: None Review of the MIP images confirms the above findings CT Brain Perfusion Findings: ASPECTS: 10 CBF (<30%) Volume: 562mPerfusion (Tmax>6.0s) volume: 1062mismatch Volume: 5mL52mfarction Location:Left frontal lobe predominantly white matter IMPRESSION: 1. CT perfusion positive for Rosmary Dionisio small area of infarct and ischemia left frontal lobe predominately in the white matter. 2. Negative for large vessel occlusion 3. No significant carotid or vertebral artery stenosis in the neck. 4. Postop changes right chest. Small right pneumothorax. Right chest tube in place. 5. These results were called by telephone  at the time of interpretation on 08/21/2022 at 6:38 pm to provider [facility] , who verbally acknowledged these results. Electronically Signed   By: Franchot Gallo M.D.   On: 08/21/2022 18:38   CT HEAD WO CONTRAST (5MM)  Result Date: 08/21/2022 CLINICAL DATA:  Seizure, history of mitral valve replacement EXAM: CT HEAD WITHOUT CONTRAST TECHNIQUE: Contiguous axial  images were obtained from the base of the skull through the vertex without intravenous contrast. RADIATION DOSE REDUCTION: This exam was performed according to the departmental dose-optimization program which includes automated exposure control, adjustment of the mA and/or kV according to patient size and/or use of iterative reconstruction technique. COMPARISON:  None Available. FINDINGS: Brain: No acute infarct or hemorrhage. Lateral ventricles and midline structures are unremarkable. No acute extra-axial fluid collections. No mass effect. Vascular: Atherosclerosis of the internal carotid arteries. No hyperdense vessel. Skull: Normal. Negative for fracture or focal lesion. Sinuses/Orbits: No acute finding. Other: None. IMPRESSION: 1. No acute intracranial process. Electronically Signed   By: Randa Ngo M.D.   On: 08/21/2022 18:12   DG Chest Port 1 View  Result Date: 08/21/2022 CLINICAL DATA:  Status post MVR EXAM: PORTABLE CHEST 1 VIEW COMPARISON:  08/19/2022 FINDINGS: Interval placement of support apparatus including endotracheal tube, esophagogastric tube, right neck pulmonary vascular catheter, tip directed over the main pulmonary artery, as well as bilateral chest and mediastinal drainage tubes. Cardiomegaly with mitral valve annular prosthesis. Probable small left pleural effusion and associated atelectasis or consolidation. Osseous structures unremarkable. IMPRESSION: 1. Interval placement of support apparatus including endotracheal tube, esophagogastric tube, right neck pulmonary vascular catheter as well as bilateral chest and mediastinal drainage tubes. 2. Cardiomegaly with mitral valve annular prosthesis. 3. Probable small left pleural effusion and associated atelectasis or consolidation. Electronically Signed   By: Delanna Ahmadi M.D.   On: 08/21/2022 13:11   VAS US DOPPLER PRE CABG  Result Date: 08/19/2022 PREOPERATIVE VASCULAR EVALUATION Patient Name:  KAILAND SEDA Bundrick  Date of Exam:    08/19/2022 Medical Rec #: [id number]        Accession #:    [id number] Date of Birth: [birth year]         Patient Gender: M Patient Age:   22 years Exam Location:  [facility] Procedure:      VAS US DOPPLER PRE CABG Referring Phys: Coralie Common --------------------------------------------------------------------------------  Indications:            Preop MVR on 10/26. Risk Factors:           Hypertension, hyperlipidemia, coronary artery disease. Vascular Interventions: Left upper arm AVF 2019. Performing Technologist: Oda Cogan RDMS, RVT  Examination Guidelines: Nissa Stannard complete evaluation includes B-mode imaging, spectral Doppler, color Doppler, and power Doppler as needed of all accessible portions of each vessel. Bilateral testing is considered an integral part of Meliana Canner complete examination. Limited examinations for reoccurring indications may be performed as noted.  Right Carotid Findings: +----------+--------+--------+--------+--------+------------------+           PSV cm/sEDV cm/sStenosisDescribeComments           +----------+--------+--------+--------+--------+------------------+ CCA Prox  97      18                                         +----------+--------+--------+--------+--------+------------------+ CCA Distal81      18  intimal thickening +----------+--------+--------+--------+--------+------------------+ ICA Prox  39      12                      intimal thickening +----------+--------+--------+--------+--------+------------------+ ICA Distal81      34                                         +----------+--------+--------+--------+--------+------------------+ ECA       86      14                                         +----------+--------+--------+--------+--------+------------------+ +----------+--------+-------+----------------+------------+           PSV cm/sEDV cmsDescribe        Arm Pressure  +----------+--------+-------+----------------+------------+ Subclavian97             Multiphasic, WNL             +----------+--------+-------+----------------+------------+ +---------+--------+--+--------+--+---------+ VertebralPSV cm/s43EDV cm/s18Antegrade +---------+--------+--+--------+--+---------+ Left Carotid Findings: +----------+--------+--------+--------+--------+------------------+           PSV cm/sEDV cm/sStenosisDescribeComments           +----------+--------+--------+--------+--------+------------------+ CCA Prox  118     17                                         +----------+--------+--------+--------+--------+------------------+ CCA Distal87      15                      intimal thickening +----------+--------+--------+--------+--------+------------------+ ICA Prox  64      20                      intimal thickening +----------+--------+--------+--------+--------+------------------+ ICA Distal81      28                                         +----------+--------+--------+--------+--------+------------------+ ECA       82      13                                         +----------+--------+--------+--------+--------+------------------+ +---------+--------+--+--------+--+---------+ VertebralPSV cm/s46EDV cm/s14Antegrade +---------+--------+--+--------+--+---------+  ABI Findings: +--------+------------------+-----+---------+--------+ Right   Rt Pressure (mmHg)IndexWaveform Comment  +--------+------------------+-----+---------+--------+ Brachial                       triphasic         +--------+------------------+-----+---------+--------+ +--------+------------------+-----+--------+-------------+ Left    Lt Pressure (mmHg)IndexWaveformComment       +--------+------------------+-----+--------+-------------+ Brachial                               AVF upper arm +--------+------------------+-----+--------+-------------+   Right Doppler Findings: +-----------+--------+-----+---------+--------+ Site       PressureIndexDoppler  Comments +-----------+--------+-----+---------+--------+ Brachial                triphasic         +-----------+--------+-----+---------+--------+ Radial  triphasic         +-----------+--------+-----+---------+--------+ Ulnar                   triphasic         +-----------+--------+-----+---------+--------+ Palmar Arch                      WNL      +-----------+--------+-----+---------+--------+  Left Doppler Findings: +--------+--------+-----+---------+-------------+ Site    PressureIndexDoppler  Comments      +--------+--------+-----+---------+-------------+ Brachial                      AVF upper arm +--------+--------+-----+---------+-------------+ Radial               triphasic              +--------+--------+-----+---------+-------------+ Ulnar                triphasic              +--------+--------+-----+---------+-------------+   Summary: Right Carotid: The extracranial vessels were near-normal with only minimal wall                thickening or plaque. Left Carotid: The extracranial vessels were near-normal with only minimal wall               thickening or plaque. Vertebrals: Bilateral vertebral arteries demonstrate antegrade flow. Right Upper Extremity: Doppler waveforms remain within normal limits with right radial compression. Doppler waveforms remain within normal limits with right ulnar compression. Left Upper Extremity: Doppler waveforms remain within normal limits with left radial compression. Doppler waveform obliterate with left ulnar compression.  Electronically signed by Deitra Mayo MD on 08/19/2022 at 12:55:30 PM.    Final    DG Chest 2 View  Result Date: 08/19/2022 CLINICAL DATA:  56 year old male with preoperative chest x-ray EXAM: CHEST - 2 VIEW COMPARISON:  04/28/2022 FINDINGS: Cardiomediastinal  silhouette unchanged in size and contour. No evidence of central vascular congestion. No interlobular septal thickening. No pneumothorax or pleural effusion. Coarsened interstitial markings, with no confluent airspace disease. No acute displaced fracture. Degenerative changes of the spine. IMPRESSION: No active cardiopulmonary disease. Electronically Signed   By: Corrie Mckusick D.O.   On: 08/19/2022 11:51   MR Ankle Right w/o contrast  Result Date: 08/06/2022 CLINICAL DATA:  Right ankle pain status post fall Najah Liverman year ago. EXAM: MRI OF THE RIGHT ANKLE WITHOUT CONTRAST TECHNIQUE: Multiplanar, multisequence MR imaging of the ankle was performed. No intravenous contrast was administered. COMPARISON:  None Available. FINDINGS: TENDONS Peroneal: Peroneal longus tendon intact. Mild tendinosis of the peroneus brevis. Posteromedial: Mild tendinosis of the posterior tibial tendon. Flexor hallucis longus tendon intact. Flexor digitorum longus tendon intact. Severe tenosynovitis of the flexor hallucis longus. Anterior: Tibialis anterior tendon intact. Extensor hallucis longus tendon intact Extensor digitorum longus tendon intact. Achilles:  Mild tendinosis of the Achilles tendon. Plantar Fascia: Intact. LIGAMENTS Lateral: Anterior talofibular ligament intact. Calcaneofibular ligament intact. Posterior talofibular ligament intact. Anterior and posterior tibiofibular ligaments intact. Medial: Deltoid ligament intact. Spring ligament intact. CARTILAGE Ankle Joint: No joint effusion. Normal ankle mortise. 7 x 12 mm osteochondral lesion involving the medial corner of the talar dome with overlying high-grade partial-thickness cartilage loss and subchondral reactive marrow edema and cystic changes. Subtalar Joints/Sinus Tarsi: Normal subtalar joints. No subtalar joint effusion. Normal sinus tarsi. Bones: No aggressive osseous lesion. No fracture or dislocation. Mild osteoarthritis of the talonavicular joint with subchondral reactive  marrow edema and  cystic changes in the navicular. Small talonavicular joint effusion. Soft Tissue: No fluid collection or hematoma. Muscles are normal without edema or atrophy. Tarsal tunnel is normal. IMPRESSION: 1. Severe tenosynovitis of the flexor hallucis longus. 2. Mild tendinosis of the peroneus brevis. 3. Mild tendinosis of the posterior tibial tendon. 4. Mild tendinosis of the Achilles tendon. 5. Saraia Platner 7 x 12 mm osteochondral lesion involving the medial corner of the talar dome with overlying high-grade partial-thickness cartilage loss and subchondral reactive marrow edema and cystic changes. 6. Mild osteoarthritis of the talonavicular joint with subchondral reactive marrow edema and cystic changes in the navicular. Electronically Signed   By: Kathreen Devoid M.D.   On: 08/06/2022 07:31   CARDIAC CATHETERIZATION  Result Date: 08/01/2022 1.  Mildly elevated pulmonary wedge pressure and PA pressure with mean wedge pressure of 17 mmHg and mean PA pressure of 25 mmHg, high cardiac output likely related to Fick measurement inpatient with AV fistula 2.  Stable moderate nonobstructive coronary artery disease with patency of the left main, moderate 50% proximal LAD stenosis unchanged from the previous study, mild nonobstructive left circumflex stenosis (dominant vessel), and mild nonobstructive stenosis of Zondra Lawlor nondominant RCA Recommend: Continue evaluation per Dr. Lavonna Monarch with plans for surgical valve repair.    Microbiology: Recent Results (from the past 240 hour(s))  Culture, blood (routine x 2)     Status: None (Preliminary result)   Collection Time: 08/29/22  4:34 PM   Specimen: BLOOD  Result Value Ref Range Status   Specimen Description   Final    BLOOD RIGHT ANTECUBITAL Performed at [facility], [address] [place], [place] [address]    Special Requests   Final    Blood Culture adequate volume BOTTLES DRAWN AEROBIC AND ANAEROBIC Performed at [facility],  [address] [place], [place] [address]    Culture   Final    NO GROWTH 2 DAYS Performed at [facility], [place] [address] [place], [place] [address]    Report Status PENDING  Incomplete  Culture, blood (routine x 2)     Status: None (Preliminary result)   Collection Time: 08/29/22  4:35 PM   Specimen: BLOOD RIGHT FOREARM  Result Value Ref Range Status   Specimen Description   Final    BLOOD RIGHT FOREARM Performed at [facility], [place] [address] [place], [place] [address]    Special Requests   Final    Blood Culture adequate volume BOTTLES DRAWN AEROBIC AND ANAEROBIC Performed at [facility], [address] [place], [place] [address]    Culture   Final    NO GROWTH 2 DAYS Performed at [facility] [address], [place], [place] [address]    Report Status PENDING  Incomplete  Resp Panel by RT-PCR (Flu Aiva Miskell&B, Covid) Anterior Nasal Swab     Status: None   Collection Time: 08/29/22  4:36 PM   Specimen: Anterior Nasal Swab  Result Value Ref Range Status   SARS Coronavirus 2 by RT PCR NEGATIVE NEGATIVE Final    Comment: (NOTE) SARS-CoV-2 target nucleic acids are NOT DETECTED.  The SARS-CoV-2 RNA is generally detectable in upper respiratory specimens during the acute phase of infection. The lowest concentration of SARS-CoV-2 viral copies this assay can detect is 138 copies/mL. Emalynn Clewis negative result does not preclude SARS-Cov-2 infection and should not be used as the sole basis for treatment or other patient management decisions. Hyder Deman negative result may occur with  improper specimen collection/handling, submission of specimen other than nasopharyngeal swab, presence of viral mutation(s) within the areas targeted by this assay, and inadequate number of viral copies(<138 copies/mL). Joevon Holliman negative result must be combined with clinical observations, patient history, and epidemiological information. The expected result is Negative.  Fact Sheet  for Patients:  EntrepreneurPulse.com.au  Fact Sheet for Healthcare Providers:  IncredibleEmployment.be  This test is no t yet approved or cleared by the [place] FDA and  has been authorized for detection and/or diagnosis of SARS-CoV-2 by FDA under an Emergency Use Authorization (EUA). This EUA will remain  in effect (meaning this test can be used) for the duration of the COVID-19 declaration under Section 564(b)(1) of the Act, 21 U.S.C.section 360bbb-3(b)(1), unless the authorization is terminated  or revoked sooner.       Influenza Joee Iovine by PCR NEGATIVE NEGATIVE Final   Influenza B by PCR NEGATIVE NEGATIVE Final    Comment: (NOTE) The Xpert Xpress SARS-CoV-2/FLU/RSV plus assay is intended as an aid in the diagnosis of influenza from Nasopharyngeal swab specimens and should not be used as Pierre Cumpton sole basis for treatment. Nasal washings and aspirates are unacceptable for Xpert Xpress SARS-CoV-2/FLU/RSV testing.  Fact Sheet for Patients: EntrepreneurPulse.com.au  Fact Sheet for Healthcare Providers: IncredibleEmployment.be  This test is not yet approved or cleared by the [place] FDA and has been authorized for detection and/or diagnosis of SARS-CoV-2 by FDA under an Emergency Use Authorization (EUA). This EUA will remain in effect (meaning this test can be used) for the duration of the COVID-19 declaration under Section 564(b)(1) of the Act, 21 U.S.C. section 360bbb-3(b)(1), unless the authorization is terminated or revoked.  Performed at [facility], [address], [place], [place] [address]      Labs: Basic Metabolic Panel: Recent Labs  Lab 08/24/22 1520 08/25/22 0451 08/26/22 0439 08/27/22 0419 08/29/22 1505 08/31/22 0218  NA 136 137 134* 136 139 136  K 4.0 3.9 4.0 3.8 4.0 3.4*  CL 102 103 98 95* 95* 93*  CO2 25 25 23 28 29 27   GLUCOSE 103* 104* 119* 95 100* 93   BUN 22* 19 34* 25* 20 15  CREATININE 4.02* 3.66* 6.35* 5.54* 4.63* 3.62*  CALCIUM 8.5* 8.6* 8.8* 8.8* 9.6 9.2  MG  --  2.4  --   --   --   --   PHOS 2.1* 3.1 4.7*  --   --   --    Liver Function Tests: Recent Labs  Lab 08/24/22 1520 08/25/22 0451 08/26/22 0439  ALBUMIN 3.2* 3.0* 2.8*   No results for input(s): "LIPASE", "AMYLASE" in the last 168 hours. No results for input(s): "AMMONIA" in the last 168 hours. CBC: Recent Labs  Lab 08/25/22 0451 08/26/22 0439 08/27/22 0419 08/29/22 1505 08/31/22 0218  WBC 8.4 9.0 7.3 10.3 9.4  HGB 8.9* 8.5* 8.5* 9.0* 8.6*  HCT 26.8* 25.1* 25.9* 27.5* 25.8*  MCV 96.1 95.4 95.2 95.5 92.8  PLT 118* 128* 156 272 333   Cardiac Enzymes: No results for input(s): "CKTOTAL", "CKMB", "CKMBINDEX", "TROPONINI" in the last 168 hours. BNP: BNP (last 3 results) Recent Labs    10/10/21 1712 12/02/21 0646 04/28/22 0420  BNP 38.4 43.4 1,809.5*    ProBNP (last 3 results) Recent Labs    02/20/22 0000  PROBNP 22,536*    CBG: Recent Labs  Lab 08/24/22 1544  GLUCAP 122*       Signed:  Fayrene Helper MD.  [facility] 08/31/2022, 12:30 PM

## 2022-09-01 ENCOUNTER — Telehealth: Payer: Self-pay

## 2022-09-01 ENCOUNTER — Other Ambulatory Visit: Payer: Self-pay | Admitting: Thoracic Surgery (Cardiothoracic Vascular Surgery)

## 2022-09-01 DIAGNOSIS — Z992 Dependence on renal dialysis: Secondary | ICD-10-CM | POA: Diagnosis not present

## 2022-09-01 DIAGNOSIS — N186 End stage renal disease: Secondary | ICD-10-CM | POA: Diagnosis not present

## 2022-09-01 DIAGNOSIS — D631 Anemia in chronic kidney disease: Secondary | ICD-10-CM | POA: Diagnosis not present

## 2022-09-01 DIAGNOSIS — E079 Disorder of thyroid, unspecified: Secondary | ICD-10-CM | POA: Diagnosis not present

## 2022-09-01 DIAGNOSIS — Z952 Presence of prosthetic heart valve: Secondary | ICD-10-CM

## 2022-09-01 DIAGNOSIS — R52 Pain, unspecified: Secondary | ICD-10-CM | POA: Diagnosis not present

## 2022-09-01 DIAGNOSIS — D509 Iron deficiency anemia, unspecified: Secondary | ICD-10-CM | POA: Diagnosis not present

## 2022-09-01 DIAGNOSIS — E876 Hypokalemia: Secondary | ICD-10-CM | POA: Diagnosis not present

## 2022-09-01 DIAGNOSIS — N2581 Secondary hyperparathyroidism of renal origin: Secondary | ICD-10-CM | POA: Diagnosis not present

## 2022-09-01 LAB — HEPATITIS B SURFACE ANTIBODY, QUANTITATIVE: Hep B S AB Quant (Post): <3.1 m[IU]/mL — ABNORMAL LOW (ref >9.9)

## 2022-09-01 NOTE — Patient Outreach (Signed)
  Emmi Stroke Care Coordination Follow Up  09/01/2022 Name:  Albert Eaton MRN:  [id number] DOB:  1965/12/18  Subjective: Albert Eaton is a 56 y.o. year old male who is a primary care patient of [name], NP   An Emmi alert was received indicating patient responded to questions: Feeling worse overall? New or worsening pain/fever/shortness of breath?.   I reached out by phone to follow up on the alert and spoke to Patient. Spoke with patient. He shares that he was having increased pain and SOB on Friday which is why he responded that way to questions. He called MD and was advised to seek medical attention at ED. Patient was diagnosed and admitted for pleural effusion. He has since been treated and returned home. Patient voices sxs have improved and he is doing and feeling better. He confirms he has all his meds and no issues regarding them. He has MD follow up appt tomorrow and his nephew will be taking him to appt. No further RN CM needs or concerns at this time.  Care Coordination Interventions Activated:  Yes  Care Coordination Interventions:  Yes, provided   Follow up plan: No further intervention required. Advised patient that they would continue to get automated EMMI-Stroke post discharge calls to assess how they are doing following recent hospitalization and will receive a call from a nurse if any of their responses were abnormal. Patient voiced understanding and was appreciative of f/u call.  Encounter Outcome:  Pt. Visit Completed   Enzo Montgomery, RN,BSN,CCM [facility] Telephonic Care Management Coordinator Direct Phone: [phone] Toll Free: [phone] Fax: [phone]

## 2022-09-01 NOTE — Telephone Encounter (Signed)
PT has fu appt with cardiology on 09-02-22- TCM not done

## 2022-09-01 NOTE — Patient Outreach (Signed)
Received a red flag Emmi stroke notification for Albert Eaton. I have assigned Enzo Montgomery, RN to call for follow up and determine if there are any Case Management needs.    [facility], [place], [place] [facility] [phone]

## 2022-09-02 ENCOUNTER — Ambulatory Visit
Admission: RE | Admit: 2022-09-02 | Discharge: 2022-09-02 | Disposition: A | Discharge status: Home / Self Care | Payer: Medicare Other | Source: Ambulatory Visit | Attending: Thoracic Surgery (Cardiothoracic Vascular Surgery) | Admitting: Thoracic Surgery (Cardiothoracic Vascular Surgery)

## 2022-09-02 ENCOUNTER — Encounter: Payer: Self-pay | Admitting: Surgical

## 2022-09-02 ENCOUNTER — Ambulatory Visit (INDEPENDENT_AMBULATORY_CARE_PROVIDER_SITE_OTHER): Payer: Self-pay | Admitting: Surgical

## 2022-09-02 VITALS — BP 126/75 | HR 100 | Resp 20 | Ht 70.0 in | Wt 199.0 lb

## 2022-09-02 DIAGNOSIS — Z952 Presence of prosthetic heart valve: Secondary | ICD-10-CM

## 2022-09-02 DIAGNOSIS — R0602 Shortness of breath: Secondary | ICD-10-CM | POA: Diagnosis not present

## 2022-09-02 DIAGNOSIS — J811 Chronic pulmonary edema: Secondary | ICD-10-CM | POA: Diagnosis not present

## 2022-09-02 DIAGNOSIS — R079 Chest pain, unspecified: Secondary | ICD-10-CM | POA: Diagnosis not present

## 2022-09-02 DIAGNOSIS — I340 Nonrheumatic mitral (valve) insufficiency: Secondary | ICD-10-CM

## 2022-09-02 LAB — TYPE AND SCREEN
ABO/RH(D): B POS
Antibody Screen: NEGATIVE
Unit division: 0
Unit division: 0
Unit division: 0
Unit division: 0

## 2022-09-02 LAB — BPAM RBC
Blood Product Expiration Date: 202311062359
Blood Product Expiration Date: 202311082359
Blood Product Expiration Date: 202311092359
Blood Product Expiration Date: 202311092359
ISSUE DATE / TIME: 202310260848
ISSUE DATE / TIME: 202310260848
ISSUE DATE / TIME: 202311010759
ISSUE DATE / TIME: 202311011041
Unit Type and Rh: 7300
Unit Type and Rh: 7300
Unit Type and Rh: 7300
Unit Type and Rh: 7300

## 2022-09-02 NOTE — Patient Instructions (Signed)
Routine instructions as previously noted at discharge

## 2022-09-02 NOTE — Progress Notes (Signed)
[name]       [place] [address]             [phone]      [name] [place] Medical Record #[id number] Date of Birth: July 18, 1966  Referring: [name], MD Primary Care: [name], NP Primary Cardiologist: [name], MD   Chief Complaint:   POST OP FOLLOW UP  08/21/2022 Albert Eaton [id number]   Surgeon:  Everlena Cooper, MD   First Assistant: Oswaldo Milian MD                                Procedure:   Right Mini thoroctomy Extracorporeal circulation 3.   Mitral valve ring annuloplasty using a 30 mm Simulus semi-rigid annuloplasty band (SN [id number]   Preoperative Diagnosis:  Severe Mitral Regurgitation and moderate Tricuspid Regurgitation   Postoperative Diagnosis:  Same but Tricuspid regurgitation mild  History of Present Illness:    Patient is a 56 year old male seen in the office on today's date and routine postsurgical follow-up status post the above described procedure.  Overall, he feels as though he is doing well.  He is not having any shortness of breath or significant chest pain.  He is not having any significant palpitations or lower extremity edema.  He continues in his routine hemodialysis schedule.  He has not had any fevers, chills or other significant constitutional symptoms.  His energy and activity level have improved over time.  He has not had any difficulty with his incisions.      Past Medical History:  Diagnosis Date   Anemia    Aortic atherosclerosis (HCC)    Arthritis    Asthma    Cancer ([place])    renal cyst   Chronic diastolic CHF (congestive heart failure) ([place]) 01/06/2017   Echo 7/16 [facility] in [place], [name]) Mild AI, mild LAE, mild concentric LVH, EF 55, normal wall motion, mild to moderate MR, mild PI, RVSP 55 // Echo 10/09/16 ([facility]):  Moderate LVH, grade 2 diastolic dysfunction, mild MR, moderate LAE    Esophagitis    ESRD (end stage renal disease) on dialysis [facility])    Dialysis  Mon Wed Fri   GERD (gastroesophageal reflux disease)    Gout    no current problems   Hepatitis    Hep B   HIV (human immunodeficiency virus infection) ([place])    HLD (hyperlipidemia)    Hypertension    Hypothyroidism    Mitral regurgitation    Neuropathy    Sleep apnea    uses c-pap   Wears glasses    Wears partial dentures      Social History   Tobacco Use  Smoking Status Former   Packs/day: 1.00   Years: 18.00   Total pack years: 18.00   Types: Cigarettes   Quit date: 2000   Years since quitting: 23.8  Smokeless Tobacco Never    Social History   Substance and Sexual Activity  Alcohol Use Not Currently     Allergies  Allergen Reactions   Ace Inhibitors Cough and Other (See Comments)    Current Outpatient Medications  Medication Sig Dispense Refill   ACETAMINOPHEN EXTRA STRENGTH 500 MG tablet Take 1,000 mg by mouth every 6 (six) hours as needed (pain.).     amiodarone (PACERONE) 200 MG tablet Take 1 tablet (200 mg total) by mouth 2 (two) times daily.  X 7 days, then decrease to 200 mg daily 60 tablet 1   amLODipine (NORVASC) 10 MG tablet Take 1 tablet (10 mg total) by mouth daily. (Patient taking differently: Take 10 mg by mouth daily after lunch.) 30 tablet 1   amoxicillin (AMOXIL) 500 MG capsule Take 1 capsule (500 mg total) by mouth daily for 5 days. 5 capsule 0   apixaban (ELIQUIS) 5 MG TABS tablet Take 1 tablet (5 mg total) by mouth 2 (two) times daily. 60 tablet 1   azelastine (ASTELIN) 0.1 % nasal spray Place 2 sprays into both nostrils 2 (two) times daily as needed for allergies. 30 mL 5   B Complex-C-Folic Acid (DIALYVITE PO) Take 1 tablet by mouth daily.     B Complex-C-Zn-Folic Acid (DIALYVITE 749-SWHQ 15 PO) Take 1 tablet by mouth daily.     BIKTARVY 50-200-25 MG TABS tablet TAKE ONE TABLET BY MOUTH EVERY DAY 30 tablet 5   budesonide-formoterol (SYMBICORT) 160-4.5 MCG/ACT inhaler Inhale 2 puffs into the lungs daily. 10.2 g 1   calcitRIOL (ROCALTROL)  0.25 MCG capsule Take 7 capsules (1.75 mcg total) by mouth every Monday, Wednesday, and Friday with hemodialysis. 60 capsule 3   desloratadine (CLARINEX) 5 MG tablet Take 1 tablet (5 mg total) by mouth daily. (Patient taking differently: Take 5 mg by mouth every evening.) 30 tablet 5   doxycycline (VIBRAMYCIN) 100 MG capsule Take 1 capsule (100 mg total) by mouth 2 (two) times daily for 5 days. 10 capsule 0   famotidine (PEPCID) 20 MG tablet Take 1 tablet (20 mg total) by mouth 2 (two) times daily. (Patient taking differently: Take 20 mg by mouth 2 (two) times daily as needed for indigestion or heartburn.) 180 tablet 3   ferric citrate (AURYXIA) 1 GM 210 MG(Fe) tablet Take 420 mg by mouth See admin instructions. Take 420 mg by mouth three times a day with meals and 210 mg with each snack     fluticasone (FLOVENT HFA) 110 MCG/ACT inhaler Inhale 2 puffs into the lungs 2 (two) times daily as needed ("for flares").     gabapentin (NEURONTIN) 300 MG capsule TAKE 1 CAPSULE(300 MG) BY MOUTH THREE TIMES DAILY (Patient taking differently: Take 300 mg by mouth 3 (three) times daily as needed (pain/neuropathy).) 90 capsule 3   ipratropium (ATROVENT) 0.06 % nasal spray USE 2 SPRAYS IN EACH NOSTRIL 2 TO 3 TIMES DAILY AS NEEDED (Patient taking differently: Place 2 sprays into both nostrils See admin instructions. Instill 2 sprays into each nostril 2-3 times a day as needed for rhinitis or allergies) 15 mL 5   levalbuterol (XOPENEX HFA) 45 MCG/ACT inhaler Inhale 2 puffs into the lungs every 4 (four) hours as needed for wheezing. 15 g 1   levothyroxine (SYNTHROID) 25 MCG tablet Take 25 mcg by mouth daily before breakfast.     lubiprostone (AMITIZA) 24 MCG capsule Take 1 capsule (24 mcg total) by mouth 2 (two) times daily with a meal. NEEDS OFFICE VISIT FOR FURTHER REFILLS 60 capsule 1   Methoxy PEG-Epoetin Beta (MIRCERA IJ) Inject 1 each into the skin every 30 (thirty) days. Administered at dialysis center once a month      metoprolol tartrate (LOPRESSOR) 25 MG tablet Take 1 tablet (25 mg total) by mouth 2 (two) times daily. 60 tablet 3   Olopatadine HCl (PATADAY) 0.7 % SOLN Place 1 drop into both eyes 2 (two) times daily as needed (allergies).     oxyCODONE (OXY IR/ROXICODONE) 5 MG immediate release  tablet Take 1 tablet (5 mg total) by mouth every 4 (four) hours as needed for severe pain. 30 tablet 0   pantoprazole (PROTONIX) 40 MG tablet Take 1 tablet (40 mg total) by mouth daily. **PLEASE CONTACT THE OFFICE TO SCHEDULE FOLLOW UP (Patient taking differently: Take 40 mg by mouth at bedtime.) 30 tablet 0   rOPINIRole (REQUIP) 1 MG tablet TAKE ONE TABLET BY MOUTH AT BEDTIME (Patient taking differently: Take 1 mg by mouth at bedtime.) 90 tablet 2   rosuvastatin (CRESTOR) 10 MG tablet TAKE ONE TABLET BY MOUTH DAILY (Patient taking differently: Take 10 mg by mouth daily.) 90 tablet 3   SENSIPAR 60 MG tablet Take 60 mg by mouth See admin instructions. Take 60 mg by mouth with supper after dialysis treatments on Mon/Wed/Fri     Spacer/Aero-Holding Chambers DEVI 1 Device by Does not apply route 2 (two) times a day. 1 each 1   Vitamin D, Ergocalciferol, (DRISDOL) 1.25 MG (50000 UNIT) CAPS capsule Take 50,000 Units by mouth every Tuesday.     No current facility-administered medications for this visit.       Physical Exam: BP 126/75   Pulse 100   Resp 20   Ht 5' 10"  (1.778 m)   Wt 199 lb (90.3 kg)   SpO2 94% Comment: RA  BMI 28.55 kg/m   General appearance: alert, cooperative, and no distress Heart: regular rate and rhythm and soft systolic murmur Lungs: clear to auscultation bilaterally Abdomen: Benign exam Extremities: No edema Wound: Incisions healing well without evidence of infection   Diagnostic Studies & Laboratory data:     Recent Radiology Findings:   DG Chest 2 View  Result Date: 09/02/2022 CLINICAL DATA:  Mitral valve prosthesis, chest pain, shortness of breath EXAM: CHEST - 2 VIEW  COMPARISON:  08/29/2022 FINDINGS: Transverse diameter of heart is increased. There is prosthetic mitral valve. There is interval decrease in pulmonary vascular congestion. There is interval clearing of infiltrates in right upper and both lower lung fields suggesting resolving pneumonia or resolving asymmetric pulmonary edema. There is blunting of lateral CP angles. There is no pneumothorax. IMPRESSION: There is interval decrease in pulmonary vascular congestion and pulmonary edema. There is interval clearing of infiltrates in right upper and both lower lung fields. There are no new focal infiltrates. Small bilateral pleural effusions. Electronically Signed   By: Elmer Picker M.D.   On: 09/02/2022 15:11      Recent Lab Findings: Lab Results  Component Value Date   WBC 9.4 08/31/2022   HGB 8.6 (L) 08/31/2022   HCT 25.8 (L) 08/31/2022   PLT 333 08/31/2022   GLUCOSE 93 08/31/2022   CHOL 113 04/17/2022   TRIG 131 04/17/2022   HDL 35 (L) 04/17/2022   LDLDIRECT 23 08/22/2022   LDLCALC 57 04/17/2022   ALT 17 08/19/2022   AST 15 08/19/2022   NA 136 08/31/2022   K 3.4 (L) 08/31/2022   CL 93 (L) 08/31/2022   CREATININE 3.62 (H) 08/31/2022   BUN 15 08/31/2022   CO2 27 08/31/2022   TSH 3.49 01/21/2022   INR 1.5 (H) 08/21/2022   HGBA1C 5.6 08/22/2022      Assessment / Plan: Excellent overall early recovery.  I reviewed his chest x-ray and it shows improvement from previous exam during hospitalization in terms of vascular congestion.  Effusions are small.  I did not make any changes to his current medication regimen.  He will continue his hemodialysis as outlined per nephrology.  We  will see the patient again in 1 month with a repeat chest x-ray.      Medication Changes: No orders of the defined types were placed in this encounter.     John Giovanni, PA-C  09/02/2022 3:40 PM

## 2022-09-03 DIAGNOSIS — Z992 Dependence on renal dialysis: Secondary | ICD-10-CM | POA: Diagnosis not present

## 2022-09-03 DIAGNOSIS — E079 Disorder of thyroid, unspecified: Secondary | ICD-10-CM | POA: Diagnosis not present

## 2022-09-03 DIAGNOSIS — N186 End stage renal disease: Secondary | ICD-10-CM | POA: Diagnosis not present

## 2022-09-03 DIAGNOSIS — E876 Hypokalemia: Secondary | ICD-10-CM | POA: Diagnosis not present

## 2022-09-03 DIAGNOSIS — D509 Iron deficiency anemia, unspecified: Secondary | ICD-10-CM | POA: Diagnosis not present

## 2022-09-03 DIAGNOSIS — N2581 Secondary hyperparathyroidism of renal origin: Secondary | ICD-10-CM | POA: Diagnosis not present

## 2022-09-03 DIAGNOSIS — R52 Pain, unspecified: Secondary | ICD-10-CM | POA: Diagnosis not present

## 2022-09-03 DIAGNOSIS — D631 Anemia in chronic kidney disease: Secondary | ICD-10-CM | POA: Diagnosis not present

## 2022-09-03 LAB — CULTURE, BLOOD (ROUTINE X 2)
Culture: NO GROWTH
Culture: NO GROWTH
Special Requests: ADEQUATE
Special Requests: ADEQUATE

## 2022-09-04 MED FILL — Heparin Sodium (Porcine) Inj 1000 Unit/ML: INTRAMUSCULAR | Qty: 30 | Status: AC

## 2022-09-04 MED FILL — Medication: Qty: 1 | Status: AC

## 2022-09-04 MED FILL — Potassium Chloride Inj 2 mEq/ML: INTRAVENOUS | Qty: 40 | Status: AC

## 2022-09-04 MED FILL — Sodium Chloride IV Soln 0.9%: INTRAVENOUS | Qty: 3000 | Status: AC

## 2022-09-04 MED FILL — Mannitol IV Soln 20%: INTRAVENOUS | Qty: 500 | Status: AC

## 2022-09-04 MED FILL — Sodium Bicarbonate IV Soln 8.4%: INTRAVENOUS | Qty: 50 | Status: AC

## 2022-09-04 MED FILL — Heparin Sodium (Porcine) Inj 1000 Unit/ML: INTRAMUSCULAR | Qty: 10 | Status: AC

## 2022-09-04 MED FILL — Heparin Sodium (Porcine) Inj 1000 Unit/ML: Qty: 1000 | Status: AC

## 2022-09-04 MED FILL — Electrolyte-R (PH 7.4) Solution: INTRAVENOUS | Qty: 4000 | Status: AC

## 2022-09-05 DIAGNOSIS — Z992 Dependence on renal dialysis: Secondary | ICD-10-CM | POA: Diagnosis not present

## 2022-09-05 DIAGNOSIS — E079 Disorder of thyroid, unspecified: Secondary | ICD-10-CM | POA: Diagnosis not present

## 2022-09-05 DIAGNOSIS — R52 Pain, unspecified: Secondary | ICD-10-CM | POA: Diagnosis not present

## 2022-09-05 DIAGNOSIS — N2581 Secondary hyperparathyroidism of renal origin: Secondary | ICD-10-CM | POA: Diagnosis not present

## 2022-09-05 DIAGNOSIS — E876 Hypokalemia: Secondary | ICD-10-CM | POA: Diagnosis not present

## 2022-09-05 DIAGNOSIS — N186 End stage renal disease: Secondary | ICD-10-CM | POA: Diagnosis not present

## 2022-09-05 DIAGNOSIS — D631 Anemia in chronic kidney disease: Secondary | ICD-10-CM | POA: Diagnosis not present

## 2022-09-05 DIAGNOSIS — D509 Iron deficiency anemia, unspecified: Secondary | ICD-10-CM | POA: Diagnosis not present

## 2022-09-08 ENCOUNTER — Encounter (HOSPITAL_COMMUNITY): Payer: Self-pay

## 2022-09-08 ENCOUNTER — Other Ambulatory Visit: Payer: Self-pay

## 2022-09-08 ENCOUNTER — Emergency Department (HOSPITAL_COMMUNITY): Payer: Medicare Other

## 2022-09-08 ENCOUNTER — Inpatient Hospital Stay (HOSPITAL_COMMUNITY)
Admission: EM | Admit: 2022-09-08 | Discharge: 2022-09-14 | DRG: 640 | Disposition: A | Discharge status: Home / Self Care | Payer: Medicare Other | Attending: Family Medicine | Admitting: Family Medicine

## 2022-09-08 DIAGNOSIS — E8889 Other specified metabolic disorders: Secondary | ICD-10-CM | POA: Diagnosis not present

## 2022-09-08 DIAGNOSIS — Z905 Acquired absence of kidney: Secondary | ICD-10-CM

## 2022-09-08 DIAGNOSIS — R52 Pain, unspecified: Secondary | ICD-10-CM | POA: Diagnosis not present

## 2022-09-08 DIAGNOSIS — I5043 Acute on chronic combined systolic (congestive) and diastolic (congestive) heart failure: Secondary | ICD-10-CM | POA: Diagnosis present

## 2022-09-08 DIAGNOSIS — Z992 Dependence on renal dialysis: Secondary | ICD-10-CM

## 2022-09-08 DIAGNOSIS — I2510 Atherosclerotic heart disease of native coronary artery without angina pectoris: Secondary | ICD-10-CM | POA: Diagnosis present

## 2022-09-08 DIAGNOSIS — Z85528 Personal history of other malignant neoplasm of kidney: Secondary | ICD-10-CM

## 2022-09-08 DIAGNOSIS — Z8249 Family history of ischemic heart disease and other diseases of the circulatory system: Secondary | ICD-10-CM

## 2022-09-08 DIAGNOSIS — I132 Hypertensive heart and chronic kidney disease with heart failure and with stage 5 chronic kidney disease, or end stage renal disease: Secondary | ICD-10-CM | POA: Diagnosis not present

## 2022-09-08 DIAGNOSIS — T699XXA Effect of reduced temperature, unspecified, initial encounter: Secondary | ICD-10-CM | POA: Diagnosis not present

## 2022-09-08 DIAGNOSIS — G629 Polyneuropathy, unspecified: Secondary | ICD-10-CM | POA: Diagnosis present

## 2022-09-08 DIAGNOSIS — E875 Hyperkalemia: Secondary | ICD-10-CM | POA: Diagnosis present

## 2022-09-08 DIAGNOSIS — B1910 Unspecified viral hepatitis B without hepatic coma: Secondary | ICD-10-CM | POA: Diagnosis present

## 2022-09-08 DIAGNOSIS — I4892 Unspecified atrial flutter: Secondary | ICD-10-CM | POA: Diagnosis not present

## 2022-09-08 DIAGNOSIS — I5032 Chronic diastolic (congestive) heart failure: Secondary | ICD-10-CM | POA: Diagnosis not present

## 2022-09-08 DIAGNOSIS — I63442 Cerebral infarction due to embolism of left cerebellar artery: Secondary | ICD-10-CM | POA: Diagnosis not present

## 2022-09-08 DIAGNOSIS — I469 Cardiac arrest, cause unspecified: Secondary | ICD-10-CM | POA: Diagnosis not present

## 2022-09-08 DIAGNOSIS — R6889 Other general symptoms and signs: Secondary | ICD-10-CM | POA: Diagnosis not present

## 2022-09-08 DIAGNOSIS — G4733 Obstructive sleep apnea (adult) (pediatric): Secondary | ICD-10-CM | POA: Diagnosis present

## 2022-09-08 DIAGNOSIS — N2581 Secondary hyperparathyroidism of renal origin: Secondary | ICD-10-CM | POA: Diagnosis present

## 2022-09-08 DIAGNOSIS — Z87891 Personal history of nicotine dependence: Secondary | ICD-10-CM

## 2022-09-08 DIAGNOSIS — I468 Cardiac arrest due to other underlying condition: Secondary | ICD-10-CM | POA: Diagnosis present

## 2022-09-08 DIAGNOSIS — R412 Retrograde amnesia: Secondary | ICD-10-CM | POA: Diagnosis present

## 2022-09-08 DIAGNOSIS — S2241XA Multiple fractures of ribs, right side, initial encounter for closed fracture: Secondary | ICD-10-CM | POA: Diagnosis not present

## 2022-09-08 DIAGNOSIS — F40240 Claustrophobia: Secondary | ICD-10-CM | POA: Diagnosis present

## 2022-09-08 DIAGNOSIS — I429 Cardiomyopathy, unspecified: Secondary | ICD-10-CM | POA: Diagnosis present

## 2022-09-08 DIAGNOSIS — I2489 Other forms of acute ischemic heart disease: Secondary | ICD-10-CM | POA: Diagnosis present

## 2022-09-08 DIAGNOSIS — G319 Degenerative disease of nervous system, unspecified: Secondary | ICD-10-CM | POA: Diagnosis not present

## 2022-09-08 DIAGNOSIS — J45901 Unspecified asthma with (acute) exacerbation: Secondary | ICD-10-CM | POA: Diagnosis present

## 2022-09-08 DIAGNOSIS — I480 Paroxysmal atrial fibrillation: Secondary | ICD-10-CM | POA: Diagnosis not present

## 2022-09-08 DIAGNOSIS — I10 Essential (primary) hypertension: Secondary | ICD-10-CM | POA: Diagnosis not present

## 2022-09-08 DIAGNOSIS — G931 Anoxic brain damage, not elsewhere classified: Secondary | ICD-10-CM | POA: Diagnosis not present

## 2022-09-08 DIAGNOSIS — E079 Disorder of thyroid, unspecified: Secondary | ICD-10-CM | POA: Diagnosis not present

## 2022-09-08 DIAGNOSIS — D631 Anemia in chronic kidney disease: Secondary | ICD-10-CM | POA: Diagnosis present

## 2022-09-08 DIAGNOSIS — B20 Human immunodeficiency virus [HIV] disease: Secondary | ICD-10-CM | POA: Diagnosis present

## 2022-09-08 DIAGNOSIS — E878 Other disorders of electrolyte and fluid balance, not elsewhere classified: Secondary | ICD-10-CM | POA: Diagnosis not present

## 2022-09-08 DIAGNOSIS — Z79890 Hormone replacement therapy (postmenopausal): Secondary | ICD-10-CM

## 2022-09-08 DIAGNOSIS — I471 Supraventricular tachycardia, unspecified: Secondary | ICD-10-CM | POA: Diagnosis present

## 2022-09-08 DIAGNOSIS — I2721 Secondary pulmonary arterial hypertension: Secondary | ICD-10-CM | POA: Diagnosis not present

## 2022-09-08 DIAGNOSIS — E876 Hypokalemia: Secondary | ICD-10-CM | POA: Diagnosis not present

## 2022-09-08 DIAGNOSIS — I493 Ventricular premature depolarization: Secondary | ICD-10-CM | POA: Diagnosis present

## 2022-09-08 DIAGNOSIS — G253 Myoclonus: Secondary | ICD-10-CM | POA: Diagnosis not present

## 2022-09-08 DIAGNOSIS — Z7901 Long term (current) use of anticoagulants: Secondary | ICD-10-CM

## 2022-09-08 DIAGNOSIS — N186 End stage renal disease: Secondary | ICD-10-CM | POA: Diagnosis present

## 2022-09-08 DIAGNOSIS — Z952 Presence of prosthetic heart valve: Secondary | ICD-10-CM

## 2022-09-08 DIAGNOSIS — S2243XA Multiple fractures of ribs, bilateral, initial encounter for closed fracture: Secondary | ICD-10-CM | POA: Diagnosis not present

## 2022-09-08 DIAGNOSIS — Z6829 Body mass index (BMI) 29.0-29.9, adult: Secondary | ICD-10-CM

## 2022-09-08 DIAGNOSIS — E663 Overweight: Secondary | ICD-10-CM | POA: Diagnosis present

## 2022-09-08 DIAGNOSIS — S2242XA Multiple fractures of ribs, left side, initial encounter for closed fracture: Secondary | ICD-10-CM | POA: Diagnosis not present

## 2022-09-08 DIAGNOSIS — I3139 Other pericardial effusion (noninflammatory): Secondary | ICD-10-CM | POA: Diagnosis not present

## 2022-09-08 DIAGNOSIS — G9341 Metabolic encephalopathy: Secondary | ICD-10-CM | POA: Diagnosis not present

## 2022-09-08 DIAGNOSIS — Z743 Need for continuous supervision: Secondary | ICD-10-CM | POA: Diagnosis not present

## 2022-09-08 DIAGNOSIS — N281 Cyst of kidney, acquired: Secondary | ICD-10-CM | POA: Diagnosis not present

## 2022-09-08 DIAGNOSIS — M96A3 Multiple fractures of ribs associated with chest compression and cardiopulmonary resuscitation: Secondary | ICD-10-CM | POA: Diagnosis present

## 2022-09-08 DIAGNOSIS — J9811 Atelectasis: Secondary | ICD-10-CM | POA: Diagnosis present

## 2022-09-08 DIAGNOSIS — N250 Renal osteodystrophy: Secondary | ICD-10-CM | POA: Diagnosis not present

## 2022-09-08 DIAGNOSIS — I700 Atherosclerosis of aorta: Secondary | ICD-10-CM | POA: Diagnosis not present

## 2022-09-08 DIAGNOSIS — Z820 Family history of epilepsy and other diseases of the nervous system: Secondary | ICD-10-CM

## 2022-09-08 DIAGNOSIS — E039 Hypothyroidism, unspecified: Secondary | ICD-10-CM | POA: Diagnosis present

## 2022-09-08 DIAGNOSIS — Z9049 Acquired absence of other specified parts of digestive tract: Secondary | ICD-10-CM

## 2022-09-08 DIAGNOSIS — I081 Rheumatic disorders of both mitral and tricuspid valves: Secondary | ICD-10-CM | POA: Diagnosis present

## 2022-09-08 DIAGNOSIS — Z79899 Other long term (current) drug therapy: Secondary | ICD-10-CM

## 2022-09-08 DIAGNOSIS — K760 Fatty (change of) liver, not elsewhere classified: Secondary | ICD-10-CM | POA: Diagnosis not present

## 2022-09-08 DIAGNOSIS — Z811 Family history of alcohol abuse and dependence: Secondary | ICD-10-CM

## 2022-09-08 DIAGNOSIS — Z8673 Personal history of transient ischemic attack (TIA), and cerebral infarction without residual deficits: Secondary | ICD-10-CM

## 2022-09-08 DIAGNOSIS — R7989 Other specified abnormal findings of blood chemistry: Secondary | ICD-10-CM

## 2022-09-08 DIAGNOSIS — J811 Chronic pulmonary edema: Secondary | ICD-10-CM | POA: Diagnosis not present

## 2022-09-08 DIAGNOSIS — I499 Cardiac arrhythmia, unspecified: Secondary | ICD-10-CM | POA: Diagnosis not present

## 2022-09-08 DIAGNOSIS — K219 Gastro-esophageal reflux disease without esophagitis: Secondary | ICD-10-CM | POA: Diagnosis present

## 2022-09-08 DIAGNOSIS — D509 Iron deficiency anemia, unspecified: Secondary | ICD-10-CM | POA: Diagnosis not present

## 2022-09-08 DIAGNOSIS — R0689 Other abnormalities of breathing: Secondary | ICD-10-CM | POA: Diagnosis not present

## 2022-09-08 DIAGNOSIS — I120 Hypertensive chronic kidney disease with stage 5 chronic kidney disease or end stage renal disease: Secondary | ICD-10-CM | POA: Diagnosis not present

## 2022-09-08 DIAGNOSIS — M109 Gout, unspecified: Secondary | ICD-10-CM | POA: Diagnosis present

## 2022-09-08 DIAGNOSIS — Z7951 Long term (current) use of inhaled steroids: Secondary | ICD-10-CM

## 2022-09-08 DIAGNOSIS — E785 Hyperlipidemia, unspecified: Secondary | ICD-10-CM | POA: Diagnosis present

## 2022-09-08 LAB — COMPREHENSIVE METABOLIC PANEL
ALT: 57 U/L — ABNORMAL HIGH (ref 0–44)
AST: 80 U/L — ABNORMAL HIGH (ref 15–41)
Albumin: 3 g/dL — ABNORMAL LOW (ref 3.5–5.0)
Alkaline Phosphatase: 88 U/L (ref 38–126)
Anion gap: 15 (ref 5–15)
BUN: 19 mg/dL (ref 6–20)
CO2: 24 mmol/L (ref 22–32)
Calcium: 10 mg/dL (ref 8.9–10.3)
Chloride: 102 mmol/L (ref 98–111)
Creatinine, Ser: 6.67 mg/dL — ABNORMAL HIGH (ref 0.61–1.24)
GFR, Estimated: 9 mL/min — ABNORMAL LOW (ref >60)
Glucose, Bld: 158 mg/dL — ABNORMAL HIGH (ref 70–99)
Potassium: 3.5 mmol/L (ref 3.5–5.1)
Sodium: 141 mmol/L (ref 135–145)
Total Bilirubin: 0.2 mg/dL — ABNORMAL LOW (ref 0.3–1.2)
Total Protein: 6.6 g/dL (ref 6.5–8.1)

## 2022-09-08 LAB — BRAIN NATRIURETIC PEPTIDE: B Natriuretic Peptide: 2787.4 pg/mL — ABNORMAL HIGH (ref 0.0–100.0)

## 2022-09-08 LAB — CBC WITH DIFFERENTIAL/PLATELET
Abs Immature Granulocytes: 0.21 10*3/uL — ABNORMAL HIGH (ref 0.00–0.07)
Basophils Absolute: 0.1 10*3/uL (ref 0.0–0.1)
Basophils Relative: 1 %
Eosinophils Absolute: 0.5 10*3/uL (ref 0.0–0.5)
Eosinophils Relative: 5 %
HCT: 24.2 % — ABNORMAL LOW (ref 39.0–52.0)
Hemoglobin: 7.7 g/dL — ABNORMAL LOW (ref 13.0–17.0)
Immature Granulocytes: 2 %
Lymphocytes Relative: 28 %
Lymphs Abs: 2.6 10*3/uL (ref 0.7–4.0)
MCH: 31.8 pg (ref 26.0–34.0)
MCHC: 31.8 g/dL (ref 30.0–36.0)
MCV: 100 fL (ref 80.0–100.0)
Monocytes Absolute: 0.8 10*3/uL (ref 0.1–1.0)
Monocytes Relative: 8 %
Neutro Abs: 5.2 10*3/uL (ref 1.7–7.7)
Neutrophils Relative %: 56 %
Platelets: 269 10*3/uL (ref 150–400)
RBC: 2.42 MIL/uL — ABNORMAL LOW (ref 4.22–5.81)
RDW: 15.7 % — ABNORMAL HIGH (ref 11.5–15.5)
WBC: 9.3 10*3/uL (ref 4.0–10.5)
nRBC: 0 % (ref 0.0–0.2)

## 2022-09-08 LAB — I-STAT CHEM 8, ED
BUN: 21 mg/dL — ABNORMAL HIGH (ref 6–20)
Calcium, Ion: 1.14 mmol/L — ABNORMAL LOW (ref 1.15–1.40)
Chloride: 104 mmol/L (ref 98–111)
Creatinine, Ser: 7.5 mg/dL — ABNORMAL HIGH (ref 0.61–1.24)
Glucose, Bld: 154 mg/dL — ABNORMAL HIGH (ref 70–99)
HCT: 24 % — ABNORMAL LOW (ref 39.0–52.0)
Hemoglobin: 8.2 g/dL — ABNORMAL LOW (ref 13.0–17.0)
Potassium: 3.5 mmol/L (ref 3.5–5.1)
Sodium: 139 mmol/L (ref 135–145)
TCO2: 22 mmol/L (ref 22–32)

## 2022-09-08 LAB — BLOOD GAS, VENOUS
Acid-Base Excess: 4.2 mmol/L — ABNORMAL HIGH (ref 0.0–2.0)
Bicarbonate: 29.2 mmol/L — ABNORMAL HIGH (ref 20.0–28.0)
O2 Saturation: 76.5 %
Patient temperature: 37
pCO2, Ven: 44 mmHg (ref 44–60)
pH, Ven: 7.43 (ref 7.25–7.43)
pO2, Ven: 48 mmHg — ABNORMAL HIGH (ref 32–45)

## 2022-09-08 LAB — HEMOGLOBIN AND HEMATOCRIT, BLOOD
HCT: 24.6 % — ABNORMAL LOW (ref 39.0–52.0)
Hemoglobin: 8 g/dL — ABNORMAL LOW (ref 13.0–17.0)

## 2022-09-08 LAB — TROPONIN I (HIGH SENSITIVITY)
Troponin I (High Sensitivity): 54 ng/L — ABNORMAL HIGH (ref <18)
Troponin I (High Sensitivity): 606 ng/L (ref <18)

## 2022-09-08 LAB — T-HELPER CELLS (CD4) COUNT (NOT AT ARMC)
CD4 % Helper T Cell: 26 % — ABNORMAL LOW (ref 33–65)
CD4 T Cell Abs: 694 /uL (ref 400–1790)

## 2022-09-08 LAB — PROTIME-INR
INR: 1.3 — ABNORMAL HIGH (ref 0.8–1.2)
Prothrombin Time: 16.1 seconds — ABNORMAL HIGH (ref 11.4–15.2)

## 2022-09-08 LAB — TSH: TSH: 2.868 u[IU]/mL (ref 0.350–4.500)

## 2022-09-08 LAB — MAGNESIUM: Magnesium: 1.7 mg/dL (ref 1.7–2.4)

## 2022-09-08 LAB — LACTIC ACID, PLASMA
Lactic Acid, Venous: 5 mmol/L (ref 0.5–1.9)
Lactic Acid, Venous: 2.4 mmol/L (ref 0.5–1.9)

## 2022-09-08 LAB — ACETAMINOPHEN LEVEL: Acetaminophen (Tylenol), Serum: <10 ug/mL — ABNORMAL LOW (ref 10–30)

## 2022-09-08 LAB — LIPASE, BLOOD: Lipase: 69 U/L — ABNORMAL HIGH (ref 11–51)

## 2022-09-08 LAB — CBG MONITORING, ED: Glucose-Capillary: 107 mg/dL — ABNORMAL HIGH (ref 70–99)

## 2022-09-08 LAB — SALICYLATE LEVEL: Salicylate Lvl: <7 mg/dL — ABNORMAL LOW (ref 7.0–30.0)

## 2022-09-08 MED ORDER — METOPROLOL TARTRATE 25 MG PO TABS
25.0000 mg | ORAL_TABLET | Freq: Two times a day (BID) | ORAL | Status: DC
  Administered 2022-09-08 – 2022-09-14 (×12): 25 mg via ORAL
  Filled 2022-09-08 (×12): qty 1

## 2022-09-08 MED ORDER — BICTEGRAVIR-EMTRICITAB-TENOFOV 50-200-25 MG PO TABS
1.0000 | ORAL_TABLET | Freq: Every day | ORAL | Status: DC
  Administered 2022-09-08 – 2022-09-14 (×7): 1 via ORAL
  Filled 2022-09-08 (×7): qty 1

## 2022-09-08 MED ORDER — LIDOCAINE 5 % EX PTCH
1.0000 | MEDICATED_PATCH | CUTANEOUS | Status: DC
  Administered 2022-09-08 – 2022-09-14 (×7): 1 via TRANSDERMAL
  Filled 2022-09-08 (×7): qty 1

## 2022-09-08 MED ORDER — BUDESONIDE 0.25 MG/2ML IN SUSP
0.2500 mg | Freq: Two times a day (BID) | RESPIRATORY_TRACT | Status: DC
  Administered 2022-09-08 – 2022-09-13 (×8): 0.25 mg via RESPIRATORY_TRACT
  Filled 2022-09-08 (×12): qty 2

## 2022-09-08 MED ORDER — OXYCODONE HCL 5 MG PO TABS
5.0000 mg | ORAL_TABLET | ORAL | Status: DC | PRN
  Administered 2022-09-10 – 2022-09-14 (×14): 5 mg via ORAL
  Filled 2022-09-08 (×15): qty 1

## 2022-09-08 MED ORDER — CINACALCET HCL 30 MG PO TABS
60.0000 mg | ORAL_TABLET | ORAL | Status: DC
  Administered 2022-09-08 – 2022-09-12 (×3): 60 mg via ORAL
  Filled 2022-09-08 (×3): qty 2

## 2022-09-08 MED ORDER — OXYCODONE HCL 5 MG PO TABS
5.0000 mg | ORAL_TABLET | Freq: Four times a day (QID) | ORAL | Status: AC
  Administered 2022-09-08 – 2022-09-10 (×6): 5 mg via ORAL
  Filled 2022-09-08 (×6): qty 1

## 2022-09-08 MED ORDER — FENTANYL CITRATE PF 50 MCG/ML IJ SOSY
PREFILLED_SYRINGE | INTRAMUSCULAR | Status: AC
  Administered 2022-09-08: 25 ug via INTRAVENOUS
  Filled 2022-09-08: qty 1

## 2022-09-08 MED ORDER — LEVALBUTEROL HCL 0.63 MG/3ML IN NEBU: 0.6300 mg | INHALATION_SOLUTION | RESPIRATORY_TRACT | Status: DC | PRN

## 2022-09-08 MED ORDER — IPRATROPIUM BROMIDE 0.06 % NA SOLN: 2.0000 | NASAL | Status: DC

## 2022-09-08 MED ORDER — CALCITRIOL 0.5 MCG PO CAPS
1.7500 ug | ORAL_CAPSULE | ORAL | Status: DC
  Administered 2022-09-10 – 2022-09-12 (×2): 1.75 ug via ORAL
  Filled 2022-09-08 (×2): qty 1

## 2022-09-08 MED ORDER — HYDROMORPHONE HCL 1 MG/ML IJ SOLN
0.5000 mg | INTRAMUSCULAR | Status: DC | PRN
  Administered 2022-09-08 (×2): 0.5 mg via INTRAVENOUS
  Filled 2022-09-08 (×2): qty 1

## 2022-09-08 MED ORDER — SODIUM CHLORIDE 0.9% FLUSH
3.0000 mL | Freq: Two times a day (BID) | INTRAVENOUS | Status: DC
  Administered 2022-09-08 – 2022-09-13 (×8): 3 mL via INTRAVENOUS

## 2022-09-08 MED ORDER — METOCLOPRAMIDE HCL 5 MG/ML IJ SOLN
5.0000 mg | Freq: Four times a day (QID) | INTRAMUSCULAR | Status: DC | PRN
  Administered 2022-09-08 – 2022-09-09 (×2): 5 mg via INTRAVENOUS
  Filled 2022-09-08 (×2): qty 2

## 2022-09-08 MED ORDER — AMLODIPINE BESYLATE 10 MG PO TABS
10.0000 mg | ORAL_TABLET | Freq: Every day | ORAL | Status: DC
  Administered 2022-09-09 – 2022-09-14 (×6): 10 mg via ORAL
  Filled 2022-09-08 (×6): qty 1

## 2022-09-08 MED ORDER — FERRIC CITRATE 1 GM 210 MG(FE) PO TABS: 420.0000 mg | ORAL_TABLET | ORAL | Status: DC

## 2022-09-08 MED ORDER — FERRIC CITRATE 1 GM 210 MG(FE) PO TABS
420.0000 mg | ORAL_TABLET | Freq: Three times a day (TID) | ORAL | Status: DC
  Administered 2022-09-09 – 2022-09-14 (×10): 420 mg via ORAL
  Filled 2022-09-08 (×18): qty 2

## 2022-09-08 MED ORDER — LEVALBUTEROL TARTRATE 45 MCG/ACT IN AERO: 2.0000 | INHALATION_SPRAY | RESPIRATORY_TRACT | Status: DC | PRN

## 2022-09-08 MED ORDER — SODIUM CHLORIDE 0.9 % IV SOLN: 250.0000 mL | INTRAVENOUS | Status: DC | PRN

## 2022-09-08 MED ORDER — APIXABAN 5 MG PO TABS
5.0000 mg | ORAL_TABLET | Freq: Two times a day (BID) | ORAL | Status: DC
  Administered 2022-09-08 – 2022-09-09 (×3): 5 mg via ORAL
  Filled 2022-09-08 (×3): qty 1

## 2022-09-08 MED ORDER — CHLORHEXIDINE GLUCONATE CLOTH 2 % EX PADS: 6.0000 | MEDICATED_PAD | Freq: Every day | CUTANEOUS | Status: DC

## 2022-09-08 MED ORDER — MOMETASONE FURO-FORMOTEROL FUM 200-5 MCG/ACT IN AERO
2.0000 | INHALATION_SPRAY | Freq: Two times a day (BID) | RESPIRATORY_TRACT | Status: DC
  Administered 2022-09-08 – 2022-09-13 (×9): 2 via RESPIRATORY_TRACT
  Filled 2022-09-08: qty 8.8

## 2022-09-08 MED ORDER — ACETAMINOPHEN EXTRA STRENGTH 500 MG PO TABS: 1000.0000 mg | ORAL_TABLET | Freq: Four times a day (QID) | ORAL | Status: DC | PRN

## 2022-09-08 MED ORDER — ROSUVASTATIN CALCIUM 5 MG PO TABS
10.0000 mg | ORAL_TABLET | Freq: Every day | ORAL | Status: DC
  Administered 2022-09-08 – 2022-09-14 (×7): 10 mg via ORAL
  Filled 2022-09-08 (×7): qty 2

## 2022-09-08 MED ORDER — IPRATROPIUM-ALBUTEROL 0.5-2.5 (3) MG/3ML IN SOLN
3.0000 mL | Freq: Once | RESPIRATORY_TRACT | Status: AC
  Administered 2022-09-08: 3 mL via RESPIRATORY_TRACT
  Filled 2022-09-08: qty 3

## 2022-09-08 MED ORDER — FENTANYL CITRATE PF 50 MCG/ML IJ SOSY: 25.0000 ug | PREFILLED_SYRINGE | Freq: Once | INTRAMUSCULAR | Status: AC

## 2022-09-08 MED ORDER — ROPINIROLE HCL 0.5 MG PO TABS
1.0000 mg | ORAL_TABLET | Freq: Every day | ORAL | Status: DC
  Administered 2022-09-08 – 2022-09-13 (×6): 1 mg via ORAL
  Filled 2022-09-08 (×6): qty 2

## 2022-09-08 MED ORDER — METHYLPREDNISOLONE SODIUM SUCC 125 MG IJ SOLR
125.0000 mg | Freq: Once | INTRAMUSCULAR | Status: AC
  Administered 2022-09-08: 125 mg via INTRAVENOUS
  Filled 2022-09-08: qty 2

## 2022-09-08 MED ORDER — IOHEXOL 350 MG/ML SOLN
75.0000 mL | Freq: Once | INTRAVENOUS | Status: AC | PRN
  Administered 2022-09-08: 75 mL via INTRAVENOUS

## 2022-09-08 MED ORDER — LORATADINE 10 MG PO TABS
10.0000 mg | ORAL_TABLET | Freq: Every day | ORAL | Status: DC
  Administered 2022-09-08 – 2022-09-14 (×7): 10 mg via ORAL
  Filled 2022-09-08 (×7): qty 1

## 2022-09-08 MED ORDER — FENTANYL CITRATE PF 50 MCG/ML IJ SOSY: 50.0000 ug | PREFILLED_SYRINGE | Freq: Once | INTRAMUSCULAR | Status: DC

## 2022-09-08 MED ORDER — SODIUM CHLORIDE 0.9% FLUSH: 3.0000 mL | INTRAVENOUS | Status: DC | PRN

## 2022-09-08 MED ORDER — LACTATED RINGERS IV BOLUS
250.0000 mL | Freq: Once | INTRAVENOUS | Status: AC
  Administered 2022-09-08: 250 mL via INTRAVENOUS

## 2022-09-08 MED ORDER — AZELASTINE HCL 0.1 % NA SOLN
2.0000 | Freq: Two times a day (BID) | NASAL | Status: DC | PRN
  Filled 2022-09-08: qty 30

## 2022-09-08 MED ORDER — PANTOPRAZOLE SODIUM 40 MG PO TBEC
40.0000 mg | DELAYED_RELEASE_TABLET | Freq: Every day | ORAL | Status: DC
  Administered 2022-09-08 – 2022-09-14 (×7): 40 mg via ORAL
  Filled 2022-09-08 (×7): qty 1

## 2022-09-08 MED ORDER — ACETAMINOPHEN 325 MG PO TABS
650.0000 mg | ORAL_TABLET | ORAL | Status: DC | PRN
  Filled 2022-09-08: qty 2

## 2022-09-08 MED ORDER — HYDROMORPHONE HCL 1 MG/ML IJ SOLN
1.0000 mg | INTRAMUSCULAR | Status: DC | PRN
  Administered 2022-09-08 – 2022-09-11 (×10): 1 mg via INTRAVENOUS
  Filled 2022-09-08 (×10): qty 1

## 2022-09-08 MED ORDER — AMIODARONE HCL 200 MG PO TABS
200.0000 mg | ORAL_TABLET | Freq: Every day | ORAL | Status: DC
  Administered 2022-09-09 – 2022-09-14 (×6): 200 mg via ORAL
  Filled 2022-09-08 (×6): qty 1

## 2022-09-08 MED ORDER — FLUTICASONE PROPIONATE HFA 110 MCG/ACT IN AERO: 2.0000 | INHALATION_SPRAY | Freq: Two times a day (BID) | RESPIRATORY_TRACT | Status: DC | PRN

## 2022-09-08 MED ORDER — GABAPENTIN 300 MG PO CAPS
300.0000 mg | ORAL_CAPSULE | Freq: Three times a day (TID) | ORAL | Status: DC | PRN
  Administered 2022-09-08 – 2022-09-09 (×2): 300 mg via ORAL
  Filled 2022-09-08 (×3): qty 1

## 2022-09-08 MED ORDER — CINACALCET HCL 30 MG PO TABS: 60.0000 mg | ORAL_TABLET | ORAL | Status: DC

## 2022-09-08 MED ORDER — FAMOTIDINE 20 MG PO TABS
20.0000 mg | ORAL_TABLET | Freq: Two times a day (BID) | ORAL | Status: DC
  Administered 2022-09-08 – 2022-09-09 (×3): 20 mg via ORAL
  Filled 2022-09-08 (×4): qty 1

## 2022-09-08 MED ORDER — FERRIC CITRATE 1 GM 210 MG(FE) PO TABS
210.0000 mg | ORAL_TABLET | ORAL | Status: DC
  Administered 2022-09-08 – 2022-09-13 (×4): 210 mg via ORAL
  Filled 2022-09-08 (×20): qty 1

## 2022-09-08 MED ORDER — LEVOTHYROXINE SODIUM 25 MCG PO TABS
25.0000 ug | ORAL_TABLET | Freq: Every day | ORAL | Status: DC
  Administered 2022-09-09 – 2022-09-14 (×6): 25 ug via ORAL
  Filled 2022-09-08 (×6): qty 1

## 2022-09-08 MED ORDER — LUBIPROSTONE 24 MCG PO CAPS
24.0000 ug | ORAL_CAPSULE | Freq: Two times a day (BID) | ORAL | Status: DC
  Administered 2022-09-08 – 2022-09-14 (×10): 24 ug via ORAL
  Filled 2022-09-08 (×14): qty 1

## 2022-09-08 MED ORDER — IPRATROPIUM BROMIDE 0.02 % IN SOLN
0.5000 mg | Freq: Four times a day (QID) | RESPIRATORY_TRACT | Status: DC
  Administered 2022-09-08 – 2022-09-09 (×5): 0.5 mg via RESPIRATORY_TRACT
  Filled 2022-09-08 (×5): qty 2.5

## 2022-09-08 MED ORDER — HYDROMORPHONE HCL 1 MG/ML IJ SOLN
1.0000 mg | Freq: Once | INTRAMUSCULAR | Status: AC
  Administered 2022-09-08: 1 mg via INTRAVENOUS
  Filled 2022-09-08: qty 1

## 2022-09-08 NOTE — H&P (Addendum)
History and Physical    Albert Eaton PVX:[id number] DOB: 24-Jan-1966 DOA: 09/08/2022  PCP: [name], NP (Confirm with patient/family/NH records and if not entered, this has to be entered at [facility] point of entry) Patient coming from: Home  I have personally briefly reviewed patient's old medical records in [place]  Chief Complaint: I do not remember what has happened.  HPI: Albert Eaton is a 57 y.o. male with medical history significant of ESRD on HD MWF, chronic HFpEF, severe MR status post mitral valve repair October 2023, PAF on amiodarone and Eliquis, OSA not tolerating CPAP, hypothyroidism, HTN, HLD, sent from HD center for cardiac arrest.  Patient does not recall any of the event during [facility] and HD center.  He reported he had a normal day yesterday and felt normal this morning before going to the HD center.  Half way into the HD process after removing about 2 L of fluid, patient became unresponsive with a GCS= 3, HD staff could not feel his radial pulses/carotid pulses and [facility] was called.  Monitor showed however patient had SVT versus A-fib.  CPR started right away and 2 rounds of epinephrine given and 1 dose of calcium gluconate pushed, then patient recovered pulses and consciousness.  During the episode, patient was also found to have agonal breathing 4-5 times a minute.  No seizure-like activity reported, no loss control of urine or bowel movement or tongue biting during the episode.  Patient reported that he has mild asthma exacerbation and has been using albuterol nebulizer daily, and confirmed is been taking amiodarone for A-fib. ED Course: Patient was found normotensive and none tachycardia not hypoxic.  CT angiogram showed multiple fractured ribs and small bilateral pleural effusion otherwise no significant abnormalities.  EKG showed normal sinus rhythm with borderline prolonged QTc.  Troponin trending 50> 600.  Lactic acid 5.0> 2.4.  Patient was evaluated by  PCCM, who recommend patient to not need to go to ICU" hospitalist for admission.  Review of Systems: As per HPI otherwise 14 point review of systems negative.    Past Medical History:  Diagnosis Date   Anemia    Aortic atherosclerosis (HCC)    Arthritis    Asthma    Cancer ([place])    renal cyst   Chronic diastolic CHF (congestive heart failure) ([place]) 01/06/2017   Echo 7/16 [facility] in [place], [name]) Mild AI, mild LAE, mild concentric LVH, EF 55, normal wall motion, mild to moderate MR, mild PI, RVSP 55 // Echo 10/09/16 ([facility]):  Moderate LVH, grade 2 diastolic dysfunction, mild MR, moderate LAE    Esophagitis    ESRD (end stage renal disease) on dialysis [facility])    Dialysis Mon Wed Fri   GERD (gastroesophageal reflux disease)    Gout    no current problems   Hepatitis    Hep B   HIV (human immunodeficiency virus infection) ([place])    HLD (hyperlipidemia)    Hypertension    Hypothyroidism    Mitral regurgitation    Neuropathy    Sleep apnea    uses c-pap   Wears glasses    Wears partial dentures     Past Surgical History:  Procedure Laterality Date   AV FISTULA PLACEMENT Left 07/02/2018   Procedure: Creation of Left arm BRACHIOBASILIC ARTERIOVENOUS  FISTULA;  Surgeon: Marty Heck, MD;  Location: [place];  Service: Vascular;  Laterality: Left;   [place] Left 08/27/2018   Procedure: Left  arm BRACHIOBASILIC VEIN TRANSPOSITION SECOND STAGE;  Surgeon: Marty Heck, MD;  Location: [place];  Service: Vascular;  Laterality: Left;   BUBBLE STUDY  09/03/2020   Procedure: BUBBLE STUDY;  Surgeon: Fay Records, MD;  Location: [place];  Service: Cardiovascular;;   BUBBLE STUDY  06/03/2022   Procedure: BUBBLE STUDY;  Surgeon: Elouise Munroe, MD;  Location: [place];  Service: Cardiology;;   CAPD INSERTION N/A 05/30/2021   Procedure: LAPAROSCOPIC INSERTION CONTINUOUS AMBULATORY PERITONEAL DIALYSIS  (CAPD) CATHETER;  Surgeon: [name],  MD;  Location: [place];  Service: Vascular;  Laterality: N/A;   CAPD REMOVAL N/A 02/13/2022   Procedure: PERITONEAL DIALYSIS CATHETER REMOVAL;  Surgeon: [name], MD;  [facility];  Service: Vascular;  Laterality: N/A;   CHOLECYSTECTOMY     COLONOSCOPY W/ BIOPSIES AND POLYPECTOMY     GIVENS CAPSULE STUDY N/A 03/01/2018   Procedure: GIVENS CAPSULE STUDY;  Surgeon: Jerene Bears, MD;  Location: [place];  Service: Gastroenterology;  Laterality: N/A;   HERNIA REPAIR     As baby   MITRAL VALVE REPAIR N/A 08/21/2022   Procedure: MINIMALLY INVASIVE MITRAL VALVE REPAIR USING RING ANNULOPLASTY SIMULUS 70m;  Surgeon: WCoralie Common MD;  [place]  Service: Open Heart Surgery;  Laterality: N/A;   MULTIPLE TOOTH EXTRACTIONS     NEPHRECTOMY Left 02/18/2021   NOSE SURGERY     RENAL BIOPSY     RIGHT/LEFT HEART CATH AND CORONARY ANGIOGRAPHY N/A 09/04/2021   Procedure: RIGHT/LEFT HEART CATH AND CORONARY ANGIOGRAPHY;  Surgeon: MBurnell Blanks MD;  [place];  Service: Cardiovascular;  Laterality: N/A;   RIGHT/LEFT HEART CATH AND CORONARY ANGIOGRAPHY N/A 08/01/2022   Procedure: RIGHT/LEFT HEART CATH AND CORONARY ANGIOGRAPHY;  Surgeon: CSherren Mocha MD;  [place];  Service: Cardiovascular;  Laterality: N/A;   TEE WITHOUT CARDIOVERSION N/A 09/03/2020   Procedure: TRANSESOPHAGEAL ECHOCARDIOGRAM (TEE);  Surgeon: RFay Records MD;  [place]  Service: Cardiovascular;  Laterality: N/A;   TEE WITHOUT CARDIOVERSION N/A 06/03/2022   Procedure: TRANSESOPHAGEAL ECHOCARDIOGRAM (TEE);  Surgeon: AElouise Munroe MD;  [place]  Service: Cardiology;  Laterality: N/A;   TEE WITHOUT CARDIOVERSION N/A 08/21/2022   Procedure: TRANSESOPHAGEAL ECHOCARDIOGRAM (TEE);  Surgeon: WCoralie Common MD;  [place]  Service: Open Heart Surgery;  Laterality: N/A;   UPPER GI ENDOSCOPY  04/05/2021     reports that he quit smoking about  23 years ago. His smoking use included cigarettes. He has a 18.00 pack-year smoking history. He has never used smokeless tobacco. He reports that he does not currently use alcohol. He reports that he does not use drugs.  Allergies  Allergen Reactions   Ace Inhibitors Cough and Other (See Comments)    Family History  Problem Relation Age of Onset   Hypertension Mother    Heart failure Mother    Hypertension Father    Alcohol abuse Father    Hypertension Sister    Multiple sclerosis Sister    Hypertension Sister    Hypertension Brother    Heart attack Maternal Grandmother 53   Scoliosis Other    Allergic rhinitis Neg Hx    Angioedema Neg Hx    Asthma Neg Hx    Eczema Neg Hx    Immunodeficiency Neg Hx    Urticaria Neg Hx    Colon cancer Neg Hx    Pancreatic cancer Neg Hx    Esophageal cancer  Neg Hx      Prior to Admission medications   Medication Sig Start Date End Date Taking? Authorizing Provider  ACETAMINOPHEN EXTRA STRENGTH 500 MG tablet Take 1,000 mg by mouth every 6 (six) hours as needed (pain.). 02/20/21   [provider]  amiodarone (PACERONE) 200 MG tablet Take 1 tablet (200 mg total) by mouth 2 (two) times daily. X 7 days, then decrease to 200 mg daily 08/27/22   Barrett, Erin R, PA-C  amLODipine (NORVASC) 10 MG tablet Take 1 tablet (10 mg total) by mouth daily. Patient taking differently: Take 10 mg by mouth daily after lunch. 10/31/16   [name], FNP  apixaban (ELIQUIS) 5 MG TABS tablet Take 1 tablet (5 mg total) by mouth 2 (two) times daily. 08/31/22 10/30/22  Elodia Florence., MD  azelastine (ASTELIN) 0.1 % nasal spray Place 2 sprays into both nostrils 2 (two) times daily as needed for allergies. 05/13/22   Valentina Shaggy, MD  B Complex-C-Folic Acid (DIALYVITE PO) Take 1 tablet by mouth daily.    [provider]  B Complex-C-Zn-Folic Acid (DIALYVITE 710-GYIR 15 PO) Take 1 tablet by mouth daily.    [provider]  BIKTARVY  50-200-25 MG TABS tablet TAKE ONE TABLET BY MOUTH EVERY DAY 06/26/22   Campbell Riches, MD  budesonide-formoterol [facility]) 160-4.5 MCG/ACT inhaler Inhale 2 puffs into the lungs daily. 08/06/22   Valentina Shaggy, MD  calcitRIOL (ROCALTROL) 0.25 MCG capsule Take 7 capsules (1.75 mcg total) by mouth every Monday, Wednesday, and Friday with hemodialysis. 08/27/22   Barrett, Erin R, PA-C  desloratadine (CLARINEX) 5 MG tablet Take 1 tablet (5 mg total) by mouth daily. Patient taking differently: Take 5 mg by mouth every evening. 05/13/22 07/28/24  Valentina Shaggy, MD  famotidine (PEPCID) 20 MG tablet Take 1 tablet (20 mg total) by mouth 2 (two) times daily. Patient taking differently: Take 20 mg by mouth 2 (two) times daily as needed for indigestion or heartburn. 02/12/22   [name], MD  ferric citrate (AURYXIA) 1 GM 210 MG(Fe) tablet Take 420 mg by mouth See admin instructions. Take 420 mg by mouth three times a day with meals and 210 mg with each snack    [provider]  fluticasone (FLOVENT HFA) 110 MCG/ACT inhaler Inhale 2 puffs into the lungs 2 (two) times daily as needed ("for flares").    [provider]  gabapentin (NEURONTIN) 300 MG capsule TAKE 1 CAPSULE(300 MG) BY MOUTH THREE TIMES DAILY Patient taking differently: Take 300 mg by mouth 3 (three) times daily as needed (pain/neuropathy). 10/09/20   [name], FNP  ipratropium (ATROVENT) 0.06 % nasal spray USE 2 SPRAYS IN EACH NOSTRIL 2 TO 3 TIMES DAILY AS NEEDED Patient taking differently: Place 2 sprays into both nostrils See admin instructions. Instill 2 sprays into each nostril 2-3 times a day as needed for rhinitis or allergies 05/13/22   Valentina Shaggy, MD  levalbuterol [facility] HFA) 45 MCG/ACT inhaler Inhale 2 puffs into the lungs every 4 (four) hours as needed for wheezing. 06/05/22   Valentina Shaggy, MD  levothyroxine (SYNTHROID) 25 MCG tablet Take 25 mcg by mouth daily before  breakfast. 03/31/22   [provider]  lubiprostone (AMITIZA) 24 MCG capsule Take 1 capsule (24 mcg total) by mouth 2 (two) times daily with a meal. NEEDS OFFICE VISIT FOR FURTHER REFILLS 03/03/22   Pyrtle, Lajuan Lines, MD  Methoxy PEG-Epoetin Beta (MIRCERA IJ) Inject 1  each into the skin every 30 (thirty) days. Administered at dialysis center once a month 11/08/20   [provider]  metoprolol tartrate (LOPRESSOR) 25 MG tablet Take 1 tablet (25 mg total) by mouth 2 (two) times daily. 08/27/22   Barrett, Erin R, PA-C  Olopatadine HCl (PATADAY) 0.7 % SOLN Place 1 drop into both eyes 2 (two) times daily as needed (allergies).    [provider]  oxyCODONE (OXY IR/ROXICODONE) 5 MG immediate release tablet Take 1 tablet (5 mg total) by mouth every 4 (four) hours as needed for severe pain. 08/27/22   Barrett, Erin R, PA-C  pantoprazole (PROTONIX) 40 MG tablet Take 1 tablet (40 mg total) by mouth daily. **PLEASE CONTACT THE OFFICE TO SCHEDULE FOLLOW UP Patient taking differently: Take 40 mg by mouth at bedtime. 03/27/22   Esterwood, Amy S, PA-C  rOPINIRole (REQUIP) 1 MG tablet TAKE ONE TABLET BY MOUTH AT BEDTIME Patient taking differently: Take 1 mg by mouth at bedtime. 01/15/22   Olalere, Cicero Duck A, MD  rosuvastatin (CRESTOR) 10 MG tablet TAKE ONE TABLET BY MOUTH DAILY Patient taking differently: Take 10 mg by mouth daily. 12/13/21   [name], MD  SENSIPAR 60 MG tablet Take 60 mg by mouth See admin instructions. Take 60 mg by mouth with supper after dialysis treatments on Mon/Wed/Fri 06/25/22   [provider]  Spacer/Aero-Holding Chambers DEVI 1 Device by Does not apply route 2 (two) times a day. 03/01/19   Bobbitt, Sedalia Muta, MD  Vitamin D, Ergocalciferol, (DRISDOL) 1.25 MG (50000 UNIT) CAPS capsule Take 50,000 Units by mouth every Tuesday.    [provider]    Physical Exam: Vitals:   09/08/22 1455 09/08/22 1530 09/08/22 1545 09/08/22 1545  BP: 131/83 (!) 136/95  (!) 139/92   Pulse: 85 83 84   Resp: 17 18 15    Temp:    98.5 F (36.9 C)  TempSrc:    Oral  SpO2: 100% 100% 100%   Weight:      Height:        Constitutional: NAD, calm, comfortable Vitals:   09/08/22 1455 09/08/22 1530 09/08/22 1545 09/08/22 1545  BP: 131/83 (!) 136/95 (!) 139/92   Pulse: 85 83 84   Resp: 17 18 15    Temp:    98.5 F (36.9 C)  TempSrc:    Oral  SpO2: 100% 100% 100%   Weight:      Height:       Eyes: PERRL, lids and conjunctivae normal ENMT: Mucous membranes are moist. Posterior pharynx clear of any exudate or lesions.Normal dentition.  Neck: normal, supple, no masses, no thyromegaly Respiratory: clear to auscultation bilaterally, scattered wheezing bilaterally, no crackles.  Increasing respiratory effort. No accessory muscle use.  Cardiovascular: Regular rate and rhythm, no murmurs / rubs / gallops. No extremity edema. 2+ pedal pulses. No carotid bruits.  Abdomen: no tenderness, no masses palpated. No hepatosplenomegaly. Bowel sounds positive.  Musculoskeletal: no clubbing / cyanosis. No joint deformity upper and lower extremities. Good ROM, no contractures. Normal muscle tone.  Skin: no rashes, lesions, ulcers. No induration Neurologic: CN 2-12 grossly intact. Sensation intact, DTR normal. Strength 5/5 in all 4.  Psychiatric: Normal judgment and insight. Alert and oriented x 3. Normal mood.     Labs on Admission: I have personally reviewed following labs and imaging studies  CBC: Recent Labs  Lab 09/08/22 1138 09/08/22 1147  WBC 9.3  --   NEUTROABS 5.2  --   HGB 7.7* 8.2*  HCT 24.2* 24.0*  MCV 100.0  --   PLT 269  --    Basic Metabolic Panel: Recent Labs  Lab 09/08/22 1138 09/08/22 1147  NA 141 139  K 3.5 3.5  CL 102 104  CO2 24  --   GLUCOSE 158* 154*  BUN 19 21*  CREATININE 6.67* 7.50*  CALCIUM 10.0  --   MG 1.7  --    GFR: Estimated Creatinine Clearance: 12.4 mL/min (A) (by C-G formula based on SCr of 7.5 mg/dL (H)). Liver  Function Tests: Recent Labs  Lab 09/08/22 1138  AST 80*  ALT 57*  ALKPHOS 88  BILITOT 0.2*  PROT 6.6  ALBUMIN 3.0*   Recent Labs  Lab 09/08/22 1138  LIPASE 69*   No results for input(s): "AMMONIA" in the last 168 hours. Coagulation Profile: Recent Labs  Lab 09/08/22 1138  INR 1.3*   Cardiac Enzymes: No results for input(s): "CKTOTAL", "CKMB", "CKMBINDEX", "TROPONINI" in the last 168 hours. BNP (last 3 results) Recent Labs    02/20/22 0000  PROBNP 22,536*   HbA1C: No results for input(s): "HGBA1C" in the last 72 hours. CBG: Recent Labs  Lab 09/08/22 1215  GLUCAP 107*   Lipid Profile: No results for input(s): "CHOL", "HDL", "LDLCALC", "TRIG", "CHOLHDL", "LDLDIRECT" in the last 72 hours. Thyroid Function Tests: Recent Labs    09/08/22 1205  TSH 2.868   Anemia Panel: No results for input(s): "VITAMINB12", "FOLATE", "FERRITIN", "TIBC", "IRON", "RETICCTPCT" in the last 72 hours. Urine analysis:    Component Value Date/Time   COLORURINE YELLOW 10/10/2021 [place] 10/10/2021 1712   LABSPEC >1.030 (H) 10/10/2021 1712   PHURINE 5.5 10/10/2021 1712   GLUCOSEU NEGATIVE 10/10/2021 1712   HGBUR SMALL (A) 10/10/2021 1712   BILIRUBINUR NEGATIVE 10/10/2021 1712   BILIRUBINUR neg 12/27/2019 0913   KETONESUR NEGATIVE 10/10/2021 1712   PROTEINUR >300 (A) 10/10/2021 1712   UROBILINOGEN 0.2 12/27/2019 0913   UROBILINOGEN 0.2 01/15/2018 1140   NITRITE NEGATIVE 10/10/2021 1712   LEUKOCYTESUR TRACE (A) 10/10/2021 1712    Radiological Exams on Admission: CT CHEST ABDOMEN PELVIS W CONTRAST  Result Date: 09/08/2022 CLINICAL DATA:  Post code, chest pain, shortness of breath EXAM: CT CHEST, ABDOMEN, AND PELVIS WITH CONTRAST TECHNIQUE: Multidetector CT imaging of the chest, abdomen and pelvis was performed following the standard protocol during bolus administration of intravenous contrast. RADIATION DOSE REDUCTION: This exam was performed according to the  departmental dose-optimization program which includes automated exposure control, adjustment of the mA and/or kV according to patient size and/or use of iterative reconstruction technique. CONTRAST:  15m OMNIPAQUE IOHEXOL 350 MG/ML SOLN COMPARISON:  CT chest done on 08/29/2022 the FINDINGS: CT CHEST FINDINGS Cardiovascular: Extensive coronary artery calcifications are seen. There is metallic density in mitral annulus suggesting prosthetic cardiac valve. Heart is enlarged in size. Small pericardial effusion is present. There is homogeneous enhancement in thoracic aorta. There are no intraluminal filling defects in central pulmonary artery branches. Mediastinum/Nodes: There is no mediastinal hematoma. Lungs/Pleura: There is interval decrease in bilateral pleural effusions. Small patchy infiltrates are seen in both mid and both lower lung fields, more so on the right side. Small right pleural effusion is seen. Few blebs are seen in the left apex. There is no demonstrable pneumothorax. Musculoskeletal: Slightly displaced fractures are seen in the anterior aspects of left third, fourth, fifth and sixth ribs. There are undisplaced fractures in the anterior right third, fourth, fifth and sixth ribs CT ABDOMEN PELVIS FINDINGS Hepatobiliary:  No focal abnormalities are seen. There is no dilation of bile ducts. Surgical clips are seen in gallbladder fossa. Pancreas: No focal abnormalities are seen. Spleen: Unremarkable. Adrenals/Urinary Tract: Left kidney is not seen suggesting left nephrectomy. Adrenals are unremarkable. There is no hydronephrosis in the right kidney. There is 2.1 cm low-density structure in the parapelvic region in right kidney suggesting renal cyst. No follow-up is recommended. There is no right renal or right ureteral stone. Urinary bladder is not distended. Stomach/Bowel: Stomach is unremarkable. Small bowel loops are not dilated. Appendix is not dilated. There is no significant focal wall thickening in  colon. There is mild diffuse wall thickening in sigmoid colon and rectum. There is no pericolic stranding. Vascular/Lymphatic: Arterial calcifications are seen. Reproductive: Prostate is not enlarged Other: There is no ascites or pneumoperitoneum. Bilateral inguinal hernias containing fat are seen. Musculoskeletal: Schmorl's nodes are seen in multiple lumbar and thoracic vertebral bodies. Multiple bilateral rib fractures as described earlier. IMPRESSION: There are recent fractures in the anterior aspect of left and right third, fourth, fifth and sixth ribs. There is no pneumothorax. Major vascular structures in the mediastinum appear intact. Small right pleural effusion. There are patchy infiltrates in mid and lower lung fields suggesting atelectasis/pneumonia. Cardiomegaly.  Coronary artery disease.  Small pericardial effusion. There is no laceration in solid organs. Fatty liver. Right renal cysts. There is mild diffuse wall thickening in rectosigmoid patient may be due to incomplete distention or suggest nonspecific mild colitis. There is no evidence of intestinal obstruction or pneumoperitoneum. There is no hydronephrosis in the right kidney. There is previous left nephrectomy. Other findings as described in the body of the report. Electronically Signed   By: Elmer Picker M.D.   On: 09/08/2022 13:42   CT Head Wo Contrast  Result Date: 09/08/2022 CLINICAL DATA:  Anoxic brain damage cpr EXAM: CT HEAD WITHOUT CONTRAST TECHNIQUE: Contiguous axial images were obtained from the base of the skull through the vertex without intravenous contrast. RADIATION DOSE REDUCTION: This exam was performed according to the departmental dose-optimization program which includes automated exposure control, adjustment of the mA and/or kV according to patient size and/or use of iterative reconstruction technique. COMPARISON:  08/22/2022 FINDINGS: Brain: Mild atrophy. No evidence of acute infarction, hemorrhage, hydrocephalus,  extra-axial collection or mass lesion/mass effect. Vascular: Atherosclerotic and physiologic intracranial calcifications. Skull: Normal. Negative for fracture or focal lesion. Sinuses/Orbits: No acute finding. Other: None IMPRESSION: 1. No acute intracranial findings. 2. Mild atrophy. Electronically Signed   By: Lucrezia Europe M.D.   On: 09/08/2022 13:08   DG Chest Portable 1 View  Result Date: 09/08/2022 CLINICAL DATA:  CPR EXAM: PORTABLE CHEST 1 VIEW COMPARISON:  08/21/2022, 09/02/2022 FINDINGS: Patient is slightly rotated. Stable cardiomegaly. Prosthetic cardiac valve. Pulmonary vascular congestion. Perihilar and bibasilar interstitial prominence. No focal airspace consolidation, pleural effusion, or pneumothorax. No acute bony findings are evident. IMPRESSION: Cardiomegaly with pulmonary vascular congestion and mild interstitial edema. Electronically Signed   By: Davina Poke D.O.   On: 09/08/2022 12:14    EKG: Independently reviewed.  Rhythm, prolonged QTc.  Assessment/Plan Principal Problem:   Cardiac arrest [facility])  (please populate well all problems here in Problem List. (For example, if patient is on BP meds at home and you resume or decide to hold them, it is a problem that needs to be her. Same for CAD, COPD, HLD and so on)  Status post cardiac pulm arrest, question of PEA -Unclear etiology, no significant symptoms this morning for hypoxia before  going to HD.  Risk factor including untreated OSA patient does take frequent daytime naps, will check VBG to rule out CO2 retention; asthma exacerbation, again patient had no significant symptoms signs of severe hypoxia yesterday with this morning, will treat asthma exacerbation as below. -Other DDx, cannot rule out high risk syncope, as patient has a new onset of A-fib recently was on amiodarone, when he completed 7 days of loading 2 weeks ago on 11/01.  Discussed with cardiology, who will see the patient emergently this afternoon. -Appears that  patient had a similar episode during last admission after mitral valve surgery, patient developed after mentations and when it was found patient had bradycardia and breathing rate dropped to 5/min, patient was reintubated and placed on pressors support. -Postarrest EKG and echocardiogram.  Acute metabolic encephalopathy -Initial GCS=3, secondary to cardiopulmonary arrest, mentation fully recovered to baseline after CPR.  Elevated troponins -Likely from CPR, trend troponins, repeat EKG.  Acute asthmatic exacerbation -Short course of p.o. steroids, breathing treatment and continue Pulmicort -VBG ordered, incentive spirometry ordered.  Bilateral multiple ribs fracture -No signs of flail chest as there is no contradictory chest wall movement, pain control with narcotics, ordered incentive spirometry -No indication for BiPAP at this point.  PAF -According to ED report, at HD center during the CPR, monitor showed SVT with heart rate around 110, with recent diagnosed arrhythmia, concern about degenerated ventricular arrhythmia, decided to continue amiodarone, cardiology consulted. -Continue Eliquis  Acute on chronic normocytic anemia -Decrease of hemoglobin level, no significant bleeding on chest CT today, recheck H&H tonight  ESRD on HD -Appears to be mild overloaded, consult nephrology  Acute on chronic HFpEF -Mildly overloaded, nephrology consulted.  HIV -Continue Biktarvy  OSA -Not tolerating CPAP due to severe claustrophobia, check VBG.  DVT prophylaxis: Eliquis Code Status: Full code Family Communication: None at bedside Disposition Plan: Patient sick with cardiopulmonary arrest and high risks of ventricle or arrhythmia recurrent, requiring inpatient cardiology work-up, expect more than 2 midnight hospital stay. Consults called: PCCM, cardiology, nephrology Admission status: PCU   Lequita Halt MD [facility] Pager [phone]  09/08/2022, 3:56 PM

## 2022-09-08 NOTE — Consult Note (Addendum)
Cardiology Consultation   Patient ID: Albert Eaton MRN: [id number]; DOB: [birth year]  Admit date: 09/08/2022 Date of Consult: 09/08/2022  PCP:  [name], NP   [place] Providers Cardiologist:  [name], MD  Electrophysiologist:  Vickie Epley, MD       Patient Profile:   Albert Eaton is a 56 y.o. male with a hx of  HFpEF MR with mini MV repair, CAD-non obstructive by cor CTA 2021 and cath 08/2021, ESRD on HD, renal cell CA s/p left nephrectomy HTN, HLD, OSA, HIV+, atrial tach conservative mgmt, amio Rx who is being seen 09/08/2022 for the evaluation of cardiac arrest at the request of Dr. Roosevelt Locks.   History of Present Illness:   Albert Eaton with above hx presents with cardiac arrest with dialysis.  2 min of CPR, only stirps ST.  He was seen by [facility] on 09/02/22 post  Mitral valve ring annuloplasty using a 30 mm Simulus semi-rigid annuloplasty band (SN [id number])  he was stable with that visit.   Pulse was 100  BP stable  Today with dialysis, pt had cardiac arrest as above.  Pt has no recollection.  No EKG changes.  He had assthma exacerbation recently. Using albuterol nebs.   Na 141, K+ 3.5 Cr 6.67 AST 80 ALT 57  BNP 2,787,  hs troponin 54 Lactic acid 5.0 Hgb 7.7 plts 269 and WBC 9.3 INR 1.3  TSH 2.868   2V CXR  IMPRESSION: There is interval decrease in pulmonary vascular congestion and pulmonary edema. There is interval clearing of infiltrates in right upper and both lower lung fields. There are no new focal infiltrates. Small bilateral pleural effusions.  BP 139/92  P 78 R 19   Past Medical History:  Diagnosis Date   Anemia    Aortic atherosclerosis (HCC)    Arthritis    Asthma    Cancer ([place])    renal cyst   Chronic diastolic CHF (congestive heart failure) ([place]) 01/06/2017   Echo 7/16 [facility] in [place], [name]) Mild AI, mild LAE, mild concentric LVH, EF 55, normal wall motion, mild to moderate MR, mild PI, RVSP 55 // Echo 10/09/16  ([facility]):  Moderate LVH, grade 2 diastolic dysfunction, mild MR, moderate LAE    Esophagitis    ESRD (end stage renal disease) on dialysis [facility])    Dialysis Mon Wed Fri   GERD (gastroesophageal reflux disease)    Gout    no current problems   Hepatitis    Hep B   HIV (human immunodeficiency virus infection) ([place])    HLD (hyperlipidemia)    Hypertension    Hypothyroidism    Mitral regurgitation    Neuropathy    Sleep apnea    uses c-pap   Wears glasses    Wears partial dentures     Past Surgical History:  Procedure Laterality Date   AV FISTULA PLACEMENT Left 07/02/2018   Procedure: Creation of Left arm BRACHIOBASILIC ARTERIOVENOUS  FISTULA;  Surgeon: Marty Heck, MD;  Location: [place];  Service: Vascular;  Laterality: Left;   [place] Left 08/27/2018   Procedure: Left arm BRACHIOBASILIC VEIN TRANSPOSITION SECOND STAGE;  Surgeon: Marty Heck, MD;  Location: [place];  Service: Vascular;  Laterality: Left;   BUBBLE STUDY  09/03/2020   Procedure: BUBBLE STUDY;  Surgeon: Fay Records, MD;  Location: [place];  Service: Cardiovascular;;   BUBBLE STUDY  06/03/2022   Procedure: BUBBLE STUDY;  Surgeon: Margaretann Loveless,  Marcina Millard, MD;  Location: [place] [facility];  Service: Cardiology;;   CAPD INSERTION N/A 05/30/2021   Procedure: LAPAROSCOPIC INSERTION CONTINUOUS AMBULATORY PERITONEAL DIALYSIS  (CAPD) CATHETER;  Surgeon: [name], MD;  Location: [place];  Service: Vascular;  Laterality: N/A;   CAPD REMOVAL N/A 02/13/2022   Procedure: PERITONEAL DIALYSIS CATHETER REMOVAL;  Surgeon: [name], MD;  [facility];  Service: Vascular;  Laterality: N/A;   CHOLECYSTECTOMY     COLONOSCOPY W/ BIOPSIES AND POLYPECTOMY     GIVENS CAPSULE STUDY N/A 03/01/2018   Procedure: GIVENS CAPSULE STUDY;  Surgeon: Jerene Bears, MD;  Location: [place];  Service: Gastroenterology;  Laterality: N/A;   HERNIA REPAIR     As baby   MITRAL VALVE REPAIR N/A 08/21/2022    Procedure: MINIMALLY INVASIVE MITRAL VALVE REPAIR USING RING ANNULOPLASTY SIMULUS 64m;  Surgeon: WCoralie Common MD;  [facility]  Service: Open Heart Surgery;  Laterality: N/A;   MULTIPLE TOOTH EXTRACTIONS     NEPHRECTOMY Left 02/18/2021   NOSE SURGERY     RENAL BIOPSY     RIGHT/LEFT HEART CATH AND CORONARY ANGIOGRAPHY N/A 09/04/2021   Procedure: RIGHT/LEFT HEART CATH AND CORONARY ANGIOGRAPHY;  Surgeon: MBurnell Blanks MD;  [facility];  Service: Cardiovascular;  Laterality: N/A;   RIGHT/LEFT HEART CATH AND CORONARY ANGIOGRAPHY N/A 08/01/2022   Procedure: RIGHT/LEFT HEART CATH AND CORONARY ANGIOGRAPHY;  Surgeon: CSherren Mocha MD;  [facility];  Service: Cardiovascular;  Laterality: N/A;   TEE WITHOUT CARDIOVERSION N/A 09/03/2020   Procedure: TRANSESOPHAGEAL ECHOCARDIOGRAM (TEE);  Surgeon: RFay Records MD;  [facility]  Service: Cardiovascular;  Laterality: N/A;   TEE WITHOUT CARDIOVERSION N/A 06/03/2022   Procedure: TRANSESOPHAGEAL ECHOCARDIOGRAM (TEE);  Surgeon: AElouise Munroe MD;  [place] [facility]  Service: Cardiology;  Laterality: N/A;   TEE WITHOUT CARDIOVERSION N/A 08/21/2022   Procedure: TRANSESOPHAGEAL ECHOCARDIOGRAM (TEE);  Surgeon: WCoralie Common MD;  [facility]  Service: Open Heart Surgery;  Laterality: N/A;   UPPER GI ENDOSCOPY  04/05/2021     Home Medications:  Prior to Admission medications   Medication Sig Start Date End Date Taking? Authorizing Provider  ACETAMINOPHEN EXTRA STRENGTH 500 MG tablet Take 1,000 mg by mouth every 6 (six) hours as needed (pain.). 02/20/21  Yes [provider]  amiodarone (PACERONE) 200 MG tablet Take 1 tablet (200 mg total) by mouth 2 (two) times daily. X 7 days, then decrease to 200 mg daily 08/27/22  Yes Barrett, Erin R, PA-C  amLODipine (NORVASC) 10 MG tablet Take 1 tablet (10 mg total) by mouth daily. Patient taking differently: Take 10 mg by mouth daily after  lunch. 10/31/16  Yes HDorena Dew FNP  apixaban (ELIQUIS) 5 MG TABS tablet Take 1 tablet (5 mg total) by mouth 2 (two) times daily. 08/31/22 10/30/22 Yes PElodia Florence, MD  azelastine (ASTELIN) 0.1 % nasal spray Place 2 sprays into both nostrils 2 (two) times daily as needed for allergies. 05/13/22  Yes GValentina Shaggy MD  B Complex-C-Zn-Folic Acid (DIALYVITE 8631-SHFW15 PO) Take 1 tablet by mouth daily.   Yes [provider]  BIKTARVY 50-200-25 MG TABS tablet TAKE ONE TABLET BY MOUTH EVERY DAY 06/26/22  Yes HCampbell Riches MD  budesonide-formoterol ([facility] 160-4.5 MCG/ACT inhaler Inhale 2 puffs into the lungs daily. 08/06/22  Yes GValentina Shaggy MD  calcitRIOL (ROCALTROL) 0.25 MCG capsule Take 7 capsules (1.75 mcg total) by mouth  every Monday, Wednesday, and Friday with hemodialysis. 08/27/22  Yes Barrett, Erin R, PA-C  desloratadine (CLARINEX) 5 MG tablet Take 1 tablet (5 mg total) by mouth daily. Patient taking differently: Take 5 mg by mouth every evening. 05/13/22 07/28/24 Yes Valentina Shaggy, MD  famotidine (PEPCID) 20 MG tablet Take 1 tablet (20 mg total) by mouth 2 (two) times daily. Patient taking differently: Take 20 mg by mouth 2 (two) times daily as needed for indigestion or heartburn. 02/12/22  Yes [name], MD  ferric citrate (AURYXIA) 1 GM 210 MG(Fe) tablet Take 420 mg by mouth See admin instructions. Take 420 mg by mouth three times a day with meals and 210 mg with each snack   Yes [provider]  fluticasone (FLOVENT HFA) 110 MCG/ACT inhaler Inhale 2 puffs into the lungs 2 (two) times daily as needed ("for flares").   Yes [provider]  gabapentin (NEURONTIN) 300 MG capsule TAKE 1 CAPSULE(300 MG) BY MOUTH THREE TIMES DAILY Patient taking differently: Take 300 mg by mouth 3 (three) times daily as needed (pain/neuropathy). 10/09/20  Yes [name], FNP  ipratropium (ATROVENT) 0.06 % nasal spray USE 2 SPRAYS IN  EACH NOSTRIL 2 TO 3 TIMES DAILY AS NEEDED Patient taking differently: Place 2 sprays into both nostrils See admin instructions. Instill 2 sprays into each nostril 2-3 times a day as needed for rhinitis or allergies 05/13/22  Yes Valentina Shaggy, MD  levalbuterol [facility] HFA) 45 MCG/ACT inhaler Inhale 2 puffs into the lungs every 4 (four) hours as needed for wheezing. 06/05/22  Yes Valentina Shaggy, MD  levothyroxine (SYNTHROID) 25 MCG tablet Take 25 mcg by mouth daily before breakfast. 03/31/22  Yes [provider]  Methoxy PEG-Epoetin Beta (MIRCERA IJ) Inject 1 each into the skin every 30 (thirty) days. Administered at dialysis center once a month 11/08/20  Yes [provider]  metoprolol tartrate (LOPRESSOR) 25 MG tablet Take 1 tablet (25 mg total) by mouth 2 (two) times daily. 08/27/22  Yes Barrett, Erin R, PA-C  Olopatadine HCl (PATADAY) 0.7 % SOLN Place 1 drop into both eyes 2 (two) times daily as needed (allergies).   Yes [provider]  oxyCODONE (OXY IR/ROXICODONE) 5 MG immediate release tablet Take 1 tablet (5 mg total) by mouth every 4 (four) hours as needed for severe pain. 08/27/22  Yes Barrett, Erin R, PA-C  pantoprazole (PROTONIX) 40 MG tablet Take 1 tablet (40 mg total) by mouth daily. **PLEASE CONTACT THE OFFICE TO SCHEDULE FOLLOW UP Patient taking differently: Take 40 mg by mouth at bedtime. 03/27/22  Yes Esterwood, Amy S, PA-C  rOPINIRole (REQUIP) 1 MG tablet TAKE ONE TABLET BY MOUTH AT BEDTIME Patient taking differently: Take 1 mg by mouth at bedtime. 01/15/22  Yes Olalere, Adewale A, MD  rosuvastatin (CRESTOR) 10 MG tablet TAKE ONE TABLET BY MOUTH DAILY Patient taking differently: Take 10 mg by mouth daily. 12/13/21  Yes [name], MD  SENSIPAR 60 MG tablet Take 60 mg by mouth See admin instructions. Take 60 mg by mouth with supper after dialysis treatments on Mon/Wed/Fri 06/25/22  Yes [provider]  Vitamin D, Ergocalciferol, (DRISDOL)  1.25 MG (50000 UNIT) CAPS capsule Take 50,000 Units by mouth every Tuesday.   Yes [provider]  B Complex-C-Folic Acid (DIALYVITE PO) Take 1 tablet by mouth daily. Patient not taking: Reported on 09/08/2022    [provider]  lubiprostone (AMITIZA) 24 MCG capsule Take 1 capsule (24 mcg total) by  mouth 2 (two) times daily with a meal. NEEDS OFFICE VISIT FOR FURTHER REFILLS Patient not taking: Reported on 09/08/2022 03/03/22   Pyrtle, Lajuan Lines, MD  Spacer/Aero-Holding Josiah Lobo DEVI 1 Device by Does not apply route 2 (two) times a day. 03/01/19   Bobbitt, Sed[place] Muta, MD    Inpatient Medications: Scheduled Meds:  [START ON 09/09/2022] amiodarone  200 mg Oral Daily   [START ON 09/09/2022] amLODipine  10 mg Oral QPC lunch   apixaban  5 mg Oral BID   bictegravir-emtricitabine-tenofovir AF  1 tablet Oral Daily   [START ON 09/10/2022] calcitRIOL  1.75 mcg Oral Q M,W,F-HD   cinacalcet  60 mg Oral See admin instructions   famotidine  20 mg Oral BID   ferric citrate  420 mg Oral See admin instructions   ipratropium  2 spray Each Nare See admin instructions   ipratropium  0.5 mg Nebulization Q6H   [START ON 09/09/2022] levothyroxine  25 mcg Oral QAC breakfast   lidocaine  1 patch Transdermal Q24H   loratadine  10 mg Oral Daily   lubiprostone  24 mcg Oral BID WC   metoprolol tartrate  25 mg Oral BID   mometasone-formoterol  2 puff Inhalation BID   oxyCODONE  5 mg Oral Q6H   pantoprazole  40 mg Oral Daily   rOPINIRole  1 mg Oral QHS   rosuvastatin  10 mg Oral Daily   sodium chloride flush  3 mL Intravenous Q12H   Continuous Infusions:  sodium chloride     PRN Meds: sodium chloride, acetaminophen, azelastine, fluticasone, gabapentin, HYDROmorphone (DILAUDID) injection, levalbuterol, oxyCODONE, sodium chloride flush  Allergies:    Allergies  Allergen Reactions   Ace Inhibitors Cough and Other (See Comments)    Social History:   Social History   Socioeconomic History    Marital status: Single    Spouse name: Not on file   Number of children: Not on file   Years of education: Not on file   Highest education level: Not on file  Occupational History   Not on file  Tobacco Use   Smoking status: Former    Packs/day: 1.00    Years: 18.00    Total pack years: 18.00    Types: Cigarettes    Quit date: 2000    Years since quitting: 23.8   Smokeless tobacco: Never  Vaping Use   Vaping Use: Never used  Substance and Sexual Activity   Alcohol use: Not Currently   Drug use: No   Sexual activity: Not Currently    Partners: Male  Other Topics Concern   Not on file  Social History Narrative   Disability      [place]: none   Social Determinants of Health   Financial Resource Strain: Low Risk  (07/08/2022)   Overall Financial Resource Strain (CARDIA)    Difficulty of Paying Living Expenses: Not hard at all  Food Insecurity: No Food Insecurity (08/21/2022)   Hunger Vital Sign    Worried About Running Out of Food in the Last Year: Never true    Ran Out of Food in the Last Year: Never true  Recent Concern: Food Insecurity - Food Insecurity Present (07/08/2022)   Hunger Vital Sign    Worried About Running Out of Food in the Last Year: Sometimes true    Ran Out of Food in the Last Year: Sometimes true  Transportation Needs: No Transportation Needs (08/21/2022)   PRAPARE - Hydrologist (Medical): No  Lack of Transportation (Non-Medical): No  Physical Activity: Insufficiently Active (07/08/2022)   Exercise Vital Sign    Days of Exercise per Week: 2 days    Minutes of Exercise per Session: 60 min  Stress: No Stress Concern Present (07/08/2022)   [place]    Feeling of Stress : Not at all  Social Connections: Socially Isolated (07/08/2022)   Social Connection and Isolation Panel [NHANES]    Frequency of Communication with Friends and Family: More than three times  a week    Frequency of Social Gatherings with Friends and Family: More than three times a week    Attends Religious Services: Never    Marine scientist or Organizations: No    Attends Archivist Meetings: Never    Marital Status: Never married  Intimate Partner Violence: Not At Risk (08/21/2022)   Humiliation, Afraid, Rape, and Kick questionnaire    Fear of Current or Ex-Partner: No    Emotionally Abused: No    Physically Abused: No    Sexually Abused: No    Family History:    Family History  Problem Relation Age of Onset   Hypertension Mother    Heart failure Mother    Hypertension Father    Alcohol abuse Father    Hypertension Sister    Multiple sclerosis Sister    Hypertension Sister    Hypertension Brother    Heart attack Maternal Grandmother 53   Scoliosis Other    Allergic rhinitis Neg Hx    Angioedema Neg Hx    Asthma Neg Hx    Eczema Neg Hx    Immunodeficiency Neg Hx    Urticaria Neg Hx    Colon cancer Neg Hx    Pancreatic cancer Neg Hx    Esophageal cancer Neg Hx      ROS:  Please see the history of present illness.  General:no colds or fevers, no weight changes Skin:no rashes or ulcers HEENT:no blurred vision, no congestion CV:see HPI PUL:see HPI GI:no diarrhea constipation or melena, no indigestion GU:no hematuria, no dysuria MS:no joint pain, no claudication Neuro:no syncope, no lightheadedness Endo:no diabetes, no thyroid disease  All other ROS reviewed and negative.     Physical Exam/Data:   Vitals:   09/08/22 1455 09/08/22 1530 09/08/22 1545 09/08/22 1545  BP: 131/83 (!) 136/95 (!) 139/92   Pulse: 85 83 84   Resp: 17 18 15    Temp:    98.5 F (36.9 C)  TempSrc:    Oral  SpO2: 100% 100% 100%   Weight:      Height:        Intake/Output Summary (Last 24 hours) at 09/08/2022 1611 Last data filed at 09/08/2022 1339 Gross per 24 hour  Intake 250 ml  Output --  Net 250 ml      09/08/2022   11:37 AM 09/02/2022    3:17  PM 08/31/2022    2:45 AM  Last 3 Weights  Weight (lbs) 199 lb 199 lb 196 lb 3.4 oz  Weight (kg) 90.266 kg 90.266 kg 89 kg     Body mass index is 28.55 kg/m.   General: No apparent distress alert and oriented x3 HEENT: Unable to assess JVD, moist mucous membranes Cardiac: Regular rate and rhythm no murmurs gallops rubs or thrills, well-healed right minithoracotomy incision is seen Respiratory: Mildly decreased breath sounds at the bases Abdomen: Soft nontender nondistended sparse bowel sounds Extremities: No edema ecchymosis or cyanosis  Vascular: Left upper arm fistula in place +2 right radial pulse Neuro: Cranial nerves II through XII are grossly intact   EKG:  The EKG was personally reviewed and demonstrates:  SR with no acute ST changes Telemetry:  Telemetry was personally reviewed and demonstrates:  SR  Relevant CV Studies: 08/21/22 POST-OP IMPRESSIONS  _ Mitral Valve: No stenosis present. There is trace regurgitation. The  gradient  recorded across the prosthetic valve is within the expected range. The  mean PG  across the repaired mitral valve is 3 mmHg.The mitral valve is status post  repair with an annuloplasty ring.   PRE-OP FINDINGS   Left Ventricle: The left ventricle has low normal systolic function, with  an ejection fraction of 50-55%. The cavity size was normal. There is  mildly increased left ventricular wall thickness. There is mild concentric  left ventricular hypertrophy.    Right Ventricle: The right ventricle has normal systolic function. The  cavity was normal. There is no increase in right ventricular wall  thickness.   Left Atrium: Left atrial size was dilated. No left atrial/left atrial  appendage thrombus was detected.   Right Atrium: Right atrial size was dilated.   Interatrial Septum: No atrial level shunt detected by color flow Doppler.   Pericardium: There is no evidence of pericardial effusion.   Mitral Valve: The mitral valve is  dilated. Mitral valve regurgitation is  severe by color flow Doppler. The MR jet is centrally-directed. There is  No evidence of mitral stenosis. Mitral valve annulus is dilated with an  anterior-posterior dimension of  4.12cm. Diameter in the commissural view is 3.76cm. Regurgitant jet is  severe with a dense signal on CWD. No flow reversal is noted in pulmonary  veins.    Echo 08/22/22  IMPRESSIONS     1. Post MV repair.   2. Left ventricular ejection fraction, by estimation, is 45 to 50%. The  left ventricle has mildly decreased function. The left ventricle  demonstrates global hypokinesis. There is mild left ventricular  hypertrophy. Left ventricular diastolic parameters  are consistent with Grade II diastolic dysfunction (pseudonormalization).  There is the interventricular septum is flattened in systole and diastole,  consistent with right ventricular pressure and volume overload.   3. Right ventricular systolic function is mildly reduced. The right  ventricular size is moderately enlarged. There is moderately elevated  pulmonary artery systolic pressure. The estimated right ventricular  systolic pressure is 93.2 mmHg.   4. Left atrial size was severely dilated.   5. Right atrial size was severely dilated.   6. The mitral valve has been repaired/replaced. Trivial mitral valve  regurgitation. No evidence of mitral stenosis. Moderate mitral annular  calcification. There is a prosthetic annuloplasty ring present in the  mitral position.   7. Tricuspid valve regurgitation is severe.   8. The aortic valve is normal in structure. Aortic valve regurgitation is  not visualized. No aortic stenosis is present.   9. The inferior vena cava is dilated in size with <50% respiratory  variability, suggesting right atrial pressure of 15 mmHg.   Conclusion(s)/Recommendation(s): No intracardiac source of embolism  detected on this transthoracic study. Consider a transesophageal   echocardiogram to exclude cardiac source of embolism if clinically  indicated.   FINDINGS   Left Ventricle: Left ventricular ejection fraction, by estimation, is 45  to 50%. The left ventricle has mildly decreased function. The left  ventricle demonstrates global hypokinesis. The left ventricular internal  cavity size was normal in size.  There is   mild left ventricular hypertrophy. The interventricular septum is  flattened in systole and diastole, consistent with right ventricular  pressure and volume overload. Left ventricular diastolic parameters are  consistent with Grade II diastolic dysfunction   (pseudonormalization).   Right Ventricle: The right ventricular size is moderately enlarged. No  increase in right ventricular wall thickness. Right ventricular systolic  function is mildly reduced. There is moderately elevated pulmonary artery  systolic pressure. The tricuspid  regurgitant velocity is 2.96 m/s, and with an assumed right atrial  pressure of 15 mmHg, the estimated right ventricular systolic pressure is  17.6 mmHg.   Left Atrium: Left atrial size was severely dilated.   Right Atrium: Right atrial size was severely dilated.   Pericardium: There is no evidence of pericardial effusion.   Mitral Valve: The mitral valve has been repaired/replaced. Moderate mitral  annular calcification. Trivial mitral valve regurgitation. There is a  prosthetic annuloplasty ring present in the mitral position. No evidence  of mitral valve stenosis.   Tricuspid Valve: The tricuspid valve is normal in structure. Tricuspid  valve regurgitation is severe. No evidence of tricuspid stenosis.   Aortic Valve: The aortic valve is normal in structure. Aortic valve  regurgitation is not visualized. No aortic stenosis is present.   Pulmonic Valve: The pulmonic valve was normal in structure. Pulmonic valve  regurgitation is mild. No evidence of pulmonic stenosis.   Aorta: The aortic root is  normal in size and structure.   Venous: The inferior vena cava is dilated in size with less than 50%  respiratory variability, suggesting right atrial pressure of 15 mmHg.   IAS/Shunts: No atrial level shunt detected by color flow Doppler. Agitated  saline contrast was given intravenously to evaluate for intracardiac  shunting.   Additional Comments: Post MV repair. A device lead is visualized in the  right ventricle.  Laboratory Data:  High Sensitivity Troponin:   Recent Labs  Lab 08/29/22 1505 08/29/22 1645 09/08/22 1138 09/08/22 1455  TROPONINIHS 160* 148* 54* 606*     Chemistry Recent Labs  Lab 09/08/22 1138 09/08/22 1147  NA 141 139  K 3.5 3.5  CL 102 104  CO2 24  --   GLUCOSE 158* 154*  BUN 19 21*  CREATININE 6.67* 7.50*  CALCIUM 10.0  --   MG 1.7  --   GFRNONAA 9*  --   ANIONGAP 15  --     Recent Labs  Lab 09/08/22 1138  PROT 6.6  ALBUMIN 3.0*  AST 80*  ALT 57*  ALKPHOS 88  BILITOT 0.2*   Lipids No results for input(s): "CHOL", "TRIG", "HDL", "LABVLDL", "LDLCALC", "CHOLHDL" in the last 168 hours.  Hematology Recent Labs  Lab 09/08/22 1138 09/08/22 1147  WBC 9.3  --   RBC 2.42*  --   HGB 7.7* 8.2*  HCT 24.2* 24.0*  MCV 100.0  --   MCH 31.8  --   MCHC 31.8  --   RDW 15.7*  --   PLT 269  --    Thyroid  Recent Labs  Lab 09/08/22 1205  TSH 2.868    BNP Recent Labs  Lab 09/08/22 1205  BNP 2,787.4*    DDimer No results for input(s): "DDIMER" in the last 168 hours.   Radiology/Studies:  CT CHEST ABDOMEN PELVIS W CONTRAST  Result Date: 09/08/2022 CLINICAL DATA:  Post code, chest pain, shortness of breath EXAM: CT CHEST, ABDOMEN, AND PELVIS WITH CONTRAST TECHNIQUE: Multidetector CT imaging of the chest,  abdomen and pelvis was performed following the standard protocol during bolus administration of intravenous contrast. RADIATION DOSE REDUCTION: This exam was performed according to the departmental dose-optimization program which  includes automated exposure control, adjustment of the mA and/or kV according to patient size and/or use of iterative reconstruction technique. CONTRAST:  71m OMNIPAQUE IOHEXOL 350 MG/ML SOLN COMPARISON:  CT chest done on 08/29/2022 the FINDINGS: CT CHEST FINDINGS Cardiovascular: Extensive coronary artery calcifications are seen. There is metallic density in mitral annulus suggesting prosthetic cardiac valve. Heart is enlarged in size. Small pericardial effusion is present. There is homogeneous enhancement in thoracic aorta. There are no intraluminal filling defects in central pulmonary artery branches. Mediastinum/Nodes: There is no mediastinal hematoma. Lungs/Pleura: There is interval decrease in bilateral pleural effusions. Small patchy infiltrates are seen in both mid and both lower lung fields, more so on the right side. Small right pleural effusion is seen. Few blebs are seen in the left apex. There is no demonstrable pneumothorax. Musculoskeletal: Slightly displaced fractures are seen in the anterior aspects of left third, fourth, fifth and sixth ribs. There are undisplaced fractures in the anterior right third, fourth, fifth and sixth ribs CT ABDOMEN PELVIS FINDINGS Hepatobiliary: No focal abnormalities are seen. There is no dilation of bile ducts. Surgical clips are seen in gallbladder fossa. Pancreas: No focal abnormalities are seen. Spleen: Unremarkable. Adrenals/Urinary Tract: Left kidney is not seen suggesting left nephrectomy. Adrenals are unremarkable. There is no hydronephrosis in the right kidney. There is 2.1 cm low-density structure in the parapelvic region in right kidney suggesting renal cyst. No follow-up is recommended. There is no right renal or right ureteral stone. Urinary bladder is not distended. Stomach/Bowel: Stomach is unremarkable. Small bowel loops are not dilated. Appendix is not dilated. There is no significant focal wall thickening in colon. There is mild diffuse wall thickening  in sigmoid colon and rectum. There is no pericolic stranding. Vascular/Lymphatic: Arterial calcifications are seen. Reproductive: Prostate is not enlarged Other: There is no ascites or pneumoperitoneum. Bilateral inguinal hernias containing fat are seen. Musculoskeletal: Schmorl's nodes are seen in multiple lumbar and thoracic vertebral bodies. Multiple bilateral rib fractures as described earlier. IMPRESSION: There are recent fractures in the anterior aspect of left and right third, fourth, fifth and sixth ribs. There is no pneumothorax. Major vascular structures in the mediastinum appear intact. Small right pleural effusion. There are patchy infiltrates in mid and lower lung fields suggesting atelectasis/pneumonia. Cardiomegaly.  Coronary artery disease.  Small pericardial effusion. There is no laceration in solid organs. Fatty liver. Right renal cysts. There is mild diffuse wall thickening in rectosigmoid patient may be due to incomplete distention or suggest nonspecific mild colitis. There is no evidence of intestinal obstruction or pneumoperitoneum. There is no hydronephrosis in the right kidney. There is previous left nephrectomy. Other findings as described in the body of the report. Electronically Signed   By: PElmer PickerM.D.   On: 09/08/2022 13:42   CT Head Wo Contrast  Result Date: 09/08/2022 CLINICAL DATA:  Anoxic brain damage cpr EXAM: CT HEAD WITHOUT CONTRAST TECHNIQUE: Contiguous axial images were obtained from the base of the skull through the vertex without intravenous contrast. RADIATION DOSE REDUCTION: This exam was performed according to the departmental dose-optimization program which includes automated exposure control, adjustment of the mA and/or kV according to patient size and/or use of iterative reconstruction technique. COMPARISON:  08/22/2022 FINDINGS: Brain: Mild atrophy. No evidence of acute infarction, hemorrhage, hydrocephalus, extra-axial collection or mass lesion/mass  effect.  Vascular: Atherosclerotic and physiologic intracranial calcifications. Skull: Normal. Negative for fracture or focal lesion. Sinuses/Orbits: No acute finding. Other: None IMPRESSION: 1. No acute intracranial findings. 2. Mild atrophy. Electronically Signed   By: Lucrezia Europe M.D.   On: 09/08/2022 13:08   DG Chest Portable 1 View  Result Date: 09/08/2022 CLINICAL DATA:  CPR EXAM: PORTABLE CHEST 1 VIEW COMPARISON:  08/21/2022, 09/02/2022 FINDINGS: Patient is slightly rotated. Stable cardiomegaly. Prosthetic cardiac valve. Pulmonary vascular congestion. Perihilar and bibasilar interstitial prominence. No focal airspace consolidation, pleural effusion, or pneumothorax. No acute bony findings are evident. IMPRESSION: Cardiomegaly with pulmonary vascular congestion and mild interstitial edema. Electronically Signed   By: Davina Poke D.O.   On: 09/08/2022 12:14     Assessment and Plan:   Cardiac arrest at dialysis --given 2 min of CPR, 2 epis, 1 Gm ca+, when EMS arrived in ST with PVCs and then SR.  Initially agonal breathing, he came around after 2 min of CPR.  Pt was awake and alert during transport -  question PEA + asthma exacerbation.  Appears that patient had a similar episode during last admission after mitral valve surgery, patient developed after mentations and when it was found patient had bradycardia and breathing rate dropped to 5/min, patient was reintubated and placed on pressors support.  Recent hx Mitral valve ring annuloplasty using a 30 mm Simulus semi-rigid annuloplasty band (SN [id number]  ESRD on HD and removed 2352 cc.    Hx SVT now on amiodarone on 200 mg daily on Eliquis  presumed atrial fib    Risk Assessment/Risk Scores:                For questions or updates, please contact [place] Please consult [email or web address] for contact info under    Signed, Cecilie Kicks, NP  09/08/2022 4:11 PM  ATTENDING ATTESTATION:  After conducting a review of all  available clinical information with the care team, interviewing the patient, and performing a physical exam, I agree with the findings and plan described in this note.   GEN: No acute distress.   HEENT:  MMM, no JVD, no scleral icterus Cardiac: RRR, no murmurs, rubs, or gallops.  Mild chest wall tenderness Respiratory: Clear to auscultation bilaterally. GI: Soft, nontender, non-distended  MS: No edema; No deformity. Neuro:  Nonfocal  Vasc:  +2 radial pulses; left upper extremity fistula in place  Patient is a 56 year old male with a history of end-stage renal disease on hemodialysis, HIV, diastolic heart failure, hypertension, hyperlipidemia, and mild to moderate diffuse coronary artery disease on recent cardiac catheterization who underwent mitral valve repair in October.  His course was complicated by myoclonus and respiratory arrest requiring reintubation.  He was noted to have developed a left frontal infarction.  He ultimately was extubated and treated for postoperative atrial fibrillation with amiodarone.  He was discharged on November 1.  He tells me he was doing well.  He did have a dialysis session last week that was interrupted due to circuit thrombosis he tells me.  He is unsure what day it was that this occurred.  He receives dialysis on Monday Wednesdays and Fridays.  He has noted some orthopnea and paroxysmal nocturnal dyspnea.  He has had some chest soreness which he thought was mild.  He denies any severe dyspnea.  Earlier today he went to dialysis.  He tells me over the last few days he has had some soft stools but that he would not characterize his necessarily diarrhea.  He then does not remember what happened but apparently he developed cardiac arrest.  He received 2 minutes of CPR with 2 doses of epinephrine and 1 g of calcium given with return of spontaneous circulation.  The monitor showed SVT versus atrial fibrillation.  His chest x-ray here showed mild edema.  His lactate was 2.4,  potassium was 3.5, and magnesium was 1.7.  His hemoglobin was 7.7.  His troponins are mildly elevated to 606.  I reviewed the patient's EKG which shows no acute ischemic changes.  The patient is having some chest wall tenderness due to CPR.  His troponins are mildly elevated but he had a reassuring cardiac catheterization recently I do not think a repeat cardiac catheterization is in order at this time.  We will obtain echocardiogram and continue to monitor the patient on telemetry.  Lenna Sciara, MD Pager [phone]

## 2022-09-08 NOTE — ED Provider Notes (Signed)
[facility] EMERGENCY DEPARTMENT Provider Note   CSN: [id number] Arrival date & time: 09/08/22  1133     History  Chief Complaint  Patient presents with   Cardiac Arrest    Albert Eaton is a 56 y.o. male with ESRD on HD MWF, pulmonary arterial hypertension, HIV, RCC status post left nephrectomy, chronic combined CHF, s/p mitral valve repair, h/o L cerebellar artery CVA, paroxysmal Afib/SVT, HTN, OSA, gout, hypothyroidism, HLD, IDA, history of prolonged QT,  presents with cardiac arrest, post-code.  Per EMS, patient had a cardiac arrest at dialysis center.  Had had 2 L fluid removed when he became unresponsive and pulses cannot be palpated.  Received 2 minutes of CPR with 2 epi's and 1 g of calcium chloride given.  On EMS arrival patient had no pulses, [place] demonstrated sinus tachycardia with no pulse, and patient was agonal he breathing with GCS 3.  At some point patient became responsive again and alert with a pulse, EKG demonstrates sinus tach.  On arrival, patient A&Ox1, complaining of chest pain. States he does not remember what happened today.  Does not remember if he was at dialysis.  Complaining of severe chest pain.  When asked questions about his medical history he states "I could not tell you right now."  When asked if he is hurting anywhere else besides his chest he states "I could not tell you right now."  He is repeatedly asking what happened, appears very anxious.  States he thinks he was short of breath earlier today.  Per chart review, patient was hospitalized from 08/21/2022 to 08/28/2022 on the CT surgery service for mitral valve repair.  Was admitted from 08/29/22 to 08/31/2022 for pleuritic chest pain.  Had a CT PE protocol with postsurgical changes, small to moderate right pleural effusion, small left effusion, bilateral atelectasis.  Symptoms improved after dialysis and he was discharged on antibiotics for possible pneumonia.  EKG was notable for  flutter and he was also started on Eliquis at discharge.   Cardiac Arrest      Home Medications Prior to Admission medications   Medication Sig Start Date End Date Taking? Authorizing Provider  ACETAMINOPHEN EXTRA STRENGTH 500 MG tablet Take 1,000 mg by mouth every 6 (six) hours as needed (pain.). 02/20/21   [provider]  amiodarone (PACERONE) 200 MG tablet Take 1 tablet (200 mg total) by mouth 2 (two) times daily. X 7 days, then decrease to 200 mg daily 08/27/22   Barrett, Erin R, PA-C  amLODipine (NORVASC) 10 MG tablet Take 1 tablet (10 mg total) by mouth daily. Patient taking differently: Take 10 mg by mouth daily after lunch. 10/31/16   [name], FNP  apixaban (ELIQUIS) 5 MG TABS tablet Take 1 tablet (5 mg total) by mouth 2 (two) times daily. 08/31/22 10/30/22  Elodia Florence., MD  azelastine (ASTELIN) 0.1 % nasal spray Place 2 sprays into both nostrils 2 (two) times daily as needed for allergies. 05/13/22   Valentina Shaggy, MD  B Complex-C-Folic Acid (DIALYVITE PO) Take 1 tablet by mouth daily.    [provider]  B Complex-C-Zn-Folic Acid (DIALYVITE 956-OZHY 15 PO) Take 1 tablet by mouth daily.    [provider]  BIKTARVY 50-200-25 MG TABS tablet TAKE ONE TABLET BY MOUTH EVERY DAY 06/26/22   Campbell Riches, MD  budesonide-formoterol [facility]) 160-4.5 MCG/ACT inhaler Inhale 2 puffs into the lungs daily. 08/06/22   Valentina Shaggy, MD  calcitRIOL ([place])  0.25 MCG capsule Take 7 capsules (1.75 mcg total) by mouth every Monday, Wednesday, and Friday with hemodialysis. 08/27/22   Barrett, Erin R, PA-C  desloratadine (CLARINEX) 5 MG tablet Take 1 tablet (5 mg total) by mouth daily. Patient taking differently: Take 5 mg by mouth every evening. 05/13/22 07/28/24  Valentina Shaggy, MD  famotidine (PEPCID) 20 MG tablet Take 1 tablet (20 mg total) by mouth 2 (two) times daily. Patient taking differently: Take 20 mg by mouth 2 (two)  times daily as needed for indigestion or heartburn. 02/12/22   [name], MD  ferric citrate (AURYXIA) 1 GM 210 MG(Fe) tablet Take 420 mg by mouth See admin instructions. Take 420 mg by mouth three times a day with meals and 210 mg with each snack    [provider]  fluticasone (FLOVENT HFA) 110 MCG/ACT inhaler Inhale 2 puffs into the lungs 2 (two) times daily as needed ("for flares").    [provider]  gabapentin (NEURONTIN) 300 MG capsule TAKE 1 CAPSULE(300 MG) BY MOUTH THREE TIMES DAILY Patient taking differently: Take 300 mg by mouth 3 (three) times daily as needed (pain/neuropathy). 10/09/20   [name], FNP  ipratropium (ATROVENT) 0.06 % nasal spray USE 2 SPRAYS IN EACH NOSTRIL 2 TO 3 TIMES DAILY AS NEEDED Patient taking differently: Place 2 sprays into both nostrils See admin instructions. Instill 2 sprays into each nostril 2-3 times a day as needed for rhinitis or allergies 05/13/22   Valentina Shaggy, MD  levalbuterol [facility] HFA) 45 MCG/ACT inhaler Inhale 2 puffs into the lungs every 4 (four) hours as needed for wheezing. 06/05/22   Valentina Shaggy, MD  levothyroxine (SYNTHROID) 25 MCG tablet Take 25 mcg by mouth daily before breakfast. 03/31/22   [provider]  lubiprostone (AMITIZA) 24 MCG capsule Take 1 capsule (24 mcg total) by mouth 2 (two) times daily with a meal. NEEDS OFFICE VISIT FOR FURTHER REFILLS 03/03/22   Pyrtle, Lajuan Lines, MD  Methoxy PEG-Epoetin Beta (MIRCERA IJ) Inject 1 each into the skin every 30 (thirty) days. Administered at dialysis center once a month 11/08/20   [provider]  metoprolol tartrate (LOPRESSOR) 25 MG tablet Take 1 tablet (25 mg total) by mouth 2 (two) times daily. 08/27/22   Barrett, Erin R, PA-C  Olopatadine HCl (PATADAY) 0.7 % SOLN Place 1 drop into both eyes 2 (two) times daily as needed (allergies).    [provider]  oxyCODONE (OXY IR/ROXICODONE) 5 MG immediate release tablet Take 1  tablet (5 mg total) by mouth every 4 (four) hours as needed for severe pain. 08/27/22   Barrett, Erin R, PA-C  pantoprazole (PROTONIX) 40 MG tablet Take 1 tablet (40 mg total) by mouth daily. **PLEASE CONTACT THE OFFICE TO SCHEDULE FOLLOW UP Patient taking differently: Take 40 mg by mouth at bedtime. 03/27/22   Esterwood, Amy S, PA-C  rOPINIRole (REQUIP) 1 MG tablet TAKE ONE TABLET BY MOUTH AT BEDTIME Patient taking differently: Take 1 mg by mouth at bedtime. 01/15/22   Olalere, Cicero Duck A, MD  rosuvastatin (CRESTOR) 10 MG tablet TAKE ONE TABLET BY MOUTH DAILY Patient taking differently: Take 10 mg by mouth daily. 12/13/21   [name], MD  SENSIPAR 60 MG tablet Take 60 mg by mouth See admin instructions. Take 60 mg by mouth with supper after dialysis treatments on Mon/Wed/Fri 06/25/22   [provider]  Spacer/Aero-Holding Chambers DEVI 1 Device by Does not apply route 2 (two) times a day.  03/01/19   Bobbitt, Sedalia Muta, MD  Vitamin D, Ergocalciferol, (DRISDOL) 1.25 MG (50000 UNIT) CAPS capsule Take 50,000 Units by mouth every Tuesday.    [provider]      Allergies    Ace inhibitors    Review of Systems   Review of Systems  Unable to perform ROS: Mental status change    Physical Exam Updated Vital Signs BP (!) 139/92   Pulse 84   Temp 98.5 F (36.9 C) (Oral)   Resp 15   Ht 5' 10"  (1.778 m)   Wt 90.3 kg   SpO2 100%   BMI 28.55 kg/m  Physical Exam General: Anxious-appearing male, lying in bed.  HEENT: PERRLA, EOMI, no nystagmus, Sclera anicteric, MMM, trachea midline, no JVD Cardiology: Tachycardic regular rate, no murmurs/rubs/gallops. BL radial and DP pulses equal bilaterally. Very TTP in anterior chest wall without palpable crepitus.  Resp: Normal respiratory rate and effort. CTAB, no wheezes, rhonchi, crackles.  Abd: Diffusely tender to palpation, epigastric region especially. Soft, non-distended. No rebound tenderness or guarding.  GU: Deferred. MSK: No  peripheral edema or signs of trauma. Extremities without deformity or TTP. No cyanosis or clubbing. Skin: warm, dry. No rashes or lesions. Back: No CVA tenderness Neuro: A&Ox1, CNs II-XII grossly intact. MAEs. Sensation grossly intact.  Psych: Anxious, confused mood and affect.   ED Results / Procedures / Treatments   Labs (all labs ordered are listed, but only abnormal results are displayed) Labs Reviewed  CBC WITH DIFFERENTIAL/PLATELET - Abnormal; Notable for the following components:      Result Value   RBC 2.42 (*)    Hemoglobin 7.7 (*)    HCT 24.2 (*)    RDW 15.7 (*)    Abs Immature Granulocytes 0.21 (*)    All other components within normal limits  COMPREHENSIVE METABOLIC PANEL - Abnormal; Notable for the following components:   Glucose, Bld 158 (*)    Creatinine, Ser 6.67 (*)    Albumin 3.0 (*)    AST 80 (*)    ALT 57 (*)    Total Bilirubin 0.2 (*)    GFR, Estimated 9 (*)    All other components within normal limits  PROTIME-INR - Abnormal; Notable for the following components:   Prothrombin Time 16.1 (*)    INR 1.3 (*)    All other components within normal limits  LIPASE, BLOOD - Abnormal; Notable for the following components:   Lipase 69 (*)    All other components within normal limits  LACTIC ACID, PLASMA - Abnormal; Notable for the following components:   Lactic Acid, Venous 5.0 (*)    All other components within normal limits  LACTIC ACID, PLASMA - Abnormal; Notable for the following components:   Lactic Acid, Venous 2.4 (*)    All other components within normal limits  BRAIN NATRIURETIC PEPTIDE - Abnormal; Notable for the following components:   B Natriuretic Peptide 2,787.4 (*)    All other components within normal limits  T-HELPER CELLS (CD4) COUNT (NOT AT [facility]) - Abnormal; Notable for the following components:   CD4 % Helper T Cell 26 (*)    All other components within normal limits  ACETAMINOPHEN LEVEL - Abnormal; Notable for the following components:    Acetaminophen (Tylenol), Serum <10 (*)    All other components within normal limits  SALICYLATE LEVEL - Abnormal; Notable for the following components:   Salicylate Lvl <8.1 (*)    All other components within normal limits  I-STAT CHEM  8, ED - Abnormal; Notable for the following components:   BUN 21 (*)    Creatinine, Ser 7.50 (*)    Glucose, Bld 154 (*)    Calcium, Ion 1.14 (*)    Hemoglobin 8.2 (*)    HCT 24.0 (*)    All other components within normal limits  CBG MONITORING, ED - Abnormal; Notable for the following components:   Glucose-Capillary 107 (*)    All other components within normal limits  TROPONIN I (HIGH SENSITIVITY) - Abnormal; Notable for the following components:   Troponin I (High Sensitivity) 54 (*)    All other components within normal limits  TROPONIN I (HIGH SENSITIVITY) - Abnormal; Notable for the following components:   Troponin I (High Sensitivity) 606 (*)    All other components within normal limits  MAGNESIUM  TSH  RAPID URINE DRUG SCREEN, HOSP PERFORMED  URINALYSIS, ROUTINE W REFLEX MICROSCOPIC  BLOOD GAS, VENOUS    EKG EKG Interpretation  Date/Time:  Monday September 08 2022 11:38:08 EST Ventricular Rate:  94 PR Interval:  212 QRS Duration: 95 QT Interval:  383 QTC Calculation: 479 R Axis:   41 Text Interpretation: Sinus rhythm Prolonged PR interval Left atrial enlargement Borderline T wave abnormalities Borderline prolonged QT interval Confirmed by Cindee Lame [phone]) on 09/08/2022 3:59:36 PM  Radiology CT CHEST ABDOMEN PELVIS W CONTRAST  Result Date: 09/08/2022 CLINICAL DATA:  Post code, chest pain, shortness of breath EXAM: CT CHEST, ABDOMEN, AND PELVIS WITH CONTRAST TECHNIQUE: Multidetector CT imaging of the chest, abdomen and pelvis was performed following the standard protocol during bolus administration of intravenous contrast. RADIATION DOSE REDUCTION: This exam was performed according to the departmental dose-optimization program  which includes automated exposure control, adjustment of the mA and/or kV according to patient size and/or use of iterative reconstruction technique. CONTRAST:  95m OMNIPAQUE IOHEXOL 350 MG/ML SOLN COMPARISON:  CT chest done on 08/29/2022 the FINDINGS: CT CHEST FINDINGS Cardiovascular: Extensive coronary artery calcifications are seen. There is metallic density in mitral annulus suggesting prosthetic cardiac valve. Heart is enlarged in size. Small pericardial effusion is present. There is homogeneous enhancement in thoracic aorta. There are no intraluminal filling defects in central pulmonary artery branches. Mediastinum/Nodes: There is no mediastinal hematoma. Lungs/Pleura: There is interval decrease in bilateral pleural effusions. Small patchy infiltrates are seen in both mid and both lower lung fields, more so on the right side. Small right pleural effusion is seen. Few blebs are seen in the left apex. There is no demonstrable pneumothorax. Musculoskeletal: Slightly displaced fractures are seen in the anterior aspects of left third, fourth, fifth and sixth ribs. There are undisplaced fractures in the anterior right third, fourth, fifth and sixth ribs CT ABDOMEN PELVIS FINDINGS Hepatobiliary: No focal abnormalities are seen. There is no dilation of bile ducts. Surgical clips are seen in gallbladder fossa. Pancreas: No focal abnormalities are seen. Spleen: Unremarkable. Adrenals/Urinary Tract: Left kidney is not seen suggesting left nephrectomy. Adrenals are unremarkable. There is no hydronephrosis in the right kidney. There is 2.1 cm low-density structure in the parapelvic region in right kidney suggesting renal cyst. No follow-up is recommended. There is no right renal or right ureteral stone. Urinary bladder is not distended. Stomach/Bowel: Stomach is unremarkable. Small bowel loops are not dilated. Appendix is not dilated. There is no significant focal wall thickening in colon. There is mild diffuse wall  thickening in sigmoid colon and rectum. There is no pericolic stranding. Vascular/Lymphatic: Arterial calcifications are seen. Reproductive: Prostate is not enlarged  Other: There is no ascites or pneumoperitoneum. Bilateral inguinal hernias containing fat are seen. Musculoskeletal: Schmorl's nodes are seen in multiple lumbar and thoracic vertebral bodies. Multiple bilateral rib fractures as described earlier. IMPRESSION: There are recent fractures in the anterior aspect of left and right third, fourth, fifth and sixth ribs. There is no pneumothorax. Major vascular structures in the mediastinum appear intact. Small right pleural effusion. There are patchy infiltrates in mid and lower lung fields suggesting atelectasis/pneumonia. Cardiomegaly.  Coronary artery disease.  Small pericardial effusion. There is no laceration in solid organs. Fatty liver. Right renal cysts. There is mild diffuse wall thickening in rectosigmoid patient may be due to incomplete distention or suggest nonspecific mild colitis. There is no evidence of intestinal obstruction or pneumoperitoneum. There is no hydronephrosis in the right kidney. There is previous left nephrectomy. Other findings as described in the body of the report. Electronically Signed   By: Elmer Picker M.D.   On: 09/08/2022 13:42   CT Head Wo Contrast  Result Date: 09/08/2022 CLINICAL DATA:  Anoxic brain damage cpr EXAM: CT HEAD WITHOUT CONTRAST TECHNIQUE: Contiguous axial images were obtained from the base of the skull through the vertex without intravenous contrast. RADIATION DOSE REDUCTION: This exam was performed according to the departmental dose-optimization program which includes automated exposure control, adjustment of the mA and/or kV according to patient size and/or use of iterative reconstruction technique. COMPARISON:  08/22/2022 FINDINGS: Brain: Mild atrophy. No evidence of acute infarction, hemorrhage, hydrocephalus, extra-axial collection or mass  lesion/mass effect. Vascular: Atherosclerotic and physiologic intracranial calcifications. Skull: Normal. Negative for fracture or focal lesion. Sinuses/Orbits: No acute finding. Other: None IMPRESSION: 1. No acute intracranial findings. 2. Mild atrophy. Electronically Signed   By: Lucrezia Europe M.D.   On: 09/08/2022 13:08   DG Chest Portable 1 View  Result Date: 09/08/2022 CLINICAL DATA:  CPR EXAM: PORTABLE CHEST 1 VIEW COMPARISON:  08/21/2022, 09/02/2022 FINDINGS: Patient is slightly rotated. Stable cardiomegaly. Prosthetic cardiac valve. Pulmonary vascular congestion. Perihilar and bibasilar interstitial prominence. No focal airspace consolidation, pleural effusion, or pneumothorax. No acute bony findings are evident. IMPRESSION: Cardiomegaly with pulmonary vascular congestion and mild interstitial edema. Electronically Signed   By: Davina Poke D.O.   On: 09/08/2022 12:14    Procedures .Critical Care  Performed by: Audley Hose, MD Authorized by: Audley Hose, MD   Critical care provider statement:    Critical care time (minutes):  80   Critical care was necessary to treat or prevent imminent or life-threatening deterioration of the following conditions:  Cardiac failure   Critical care was time spent personally by me on the following activities:  Development of treatment plan with patient or surrogate, discussions with consultants, evaluation of patient's response to treatment, examination of patient, ordering and review of laboratory studies, ordering and review of radiographic studies, ordering and performing treatments and interventions, pulse oximetry, re-evaluation of patient's condition and review of old charts        EMERGENCY DEPARTMENT [place] CARDIAC EXAM "Study: Limited Ultrasound of the Heart and Pericardium"  INDICATIONS:Abnormal vital signs, Cardiac arrest, and Chest pain Multiple views of the heart and pericardium were obtained in real-time with a multi-frequency  probe.  PERFORMED GM:WNUUVO IMAGES ARCHIVED?: No LIMITATIONS:  Emergent procedure VIEWS USED: Subcostal 4 chamber, Parasternal long axis, Parasternal short axis, and Apical 4 chamber  INTERPRETATION: Cardiac activity present, Pericardial effusioin absent, Cardiac tamponade absent, Increased contractility, and no notable wall motion abnormalities, no notable D-sign, normal size of RV  Medications Ordered in ED Medications  lidocaine (LIDODERM) 5 % 1 patch (1 patch Transdermal Patch Applied 09/08/22 1451)  oxyCODONE (Oxy IR/ROXICODONE) immediate release tablet 5 mg (5 mg Oral Given 09/08/22 1520)  HYDROmorphone (DILAUDID) injection 0.5 mg (0.5 mg Intravenous Given 09/08/22 1520)  oxyCODONE (Oxy IR/ROXICODONE) immediate release tablet 5 mg (has no administration in time range)  bictegravir-emtricitabine-tenofovir AF (BIKTARVY) 50-200-25 MG per tablet 1 tablet (has no administration in time range)  amiodarone (PACERONE) tablet 200 mg (has no administration in time range)  amLODipine (NORVASC) tablet 10 mg (has no administration in time range)  metoprolol tartrate (LOPRESSOR) tablet 25 mg (has no administration in time range)  rosuvastatin (CRESTOR) tablet 10 mg (has no administration in time range)  calcitRIOL (ROCALTROL) capsule 1.75 mcg (has no administration in time range)  levothyroxine (SYNTHROID) tablet 25 mcg (has no administration in time range)  cinacalcet (SENSIPAR) tablet 60 mg (has no administration in time range)  famotidine (PEPCID) tablet 20 mg (has no administration in time range)  ferric citrate (AURYXIA) tablet 420 mg (has no administration in time range)  lubiprostone (AMITIZA) capsule 24 mcg (has no administration in time range)  pantoprazole (PROTONIX) EC tablet 40 mg (has no administration in time range)  apixaban (ELIQUIS) tablet 5 mg (has no administration in time range)  gabapentin (NEURONTIN) capsule 300 mg (has no administration in time range)  rOPINIRole (REQUIP)  tablet 1 mg (has no administration in time range)  azelastine (ASTELIN) 0.1 % nasal spray 2 spray (has no administration in time range)  mometasone-formoterol (DULERA) 200-5 MCG/ACT inhaler 2 puff (has no administration in time range)  loratadine (CLARITIN) tablet 10 mg (has no administration in time range)  fluticasone (FLOVENT HFA) 110 MCG/ACT inhaler 2 puff (has no administration in time range)  ipratropium (ATROVENT) 0.06 % nasal spray 2 spray (has no administration in time range)  levalbuterol (XOPENEX HFA) inhaler 2 puff (has no administration in time range)  sodium chloride flush (NS) 0.9 % injection 3 mL (has no administration in time range)  sodium chloride flush (NS) 0.9 % injection 3 mL (has no administration in time range)  0.9 %  sodium chloride infusion (has no administration in time range)  acetaminophen (TYLENOL) tablet 650 mg (has no administration in time range)  ipratropium (ATROVENT) nebulizer solution 0.5 mg (has no administration in time range)  fentaNYL (SUBLIMAZE) injection 25 mcg (25 mcg Intravenous Given 09/08/22 1200)  methylPREDNISolone sodium succinate (SOLU-MEDROL) 125 mg/2 mL injection 125 mg (125 mg Intravenous Given 09/08/22 1314)  ipratropium-albuterol (DUONEB) 0.5-2.5 (3) MG/3ML nebulizer solution 3 mL (3 mLs Nebulization Given 09/08/22 1321)  lactated ringers bolus 250 mL (0 mLs Intravenous Stopped 09/08/22 1339)  iohexol (OMNIPAQUE) 350 MG/ML injection 75 mL (75 mLs Intravenous Contrast Given 09/08/22 1256)  HYDROmorphone (DILAUDID) injection 1 mg (1 mg Intravenous Given 09/08/22 1312)    ED Course/ Medical Decision Making/ A&P                          Medical Decision Making Amount and/or Complexity of Data Reviewed Labs: ordered. Decision-making details documented in ED Course. Radiology: ordered. Decision-making details documented in ED Course.  Risk Prescription drug management. Decision regarding hospitalization.    Patient presents from  dialysis with reported cardiac arrest.  Patient is in sinus tach on arrival, afebrile, slightly tachypneic, but is satting well on room air with blood pressure 142/92.  He is alert and oriented x1, with amnesia to the events  of today.  Complains of severe chest pain.  Consider that patient had cardiac arrest vs syncopal episode. No pulse could be palpated by bystanders or EMS, but patient awoke and is alert, responsive, confused but focally neuro intact. No seizure activity noted on scene.   For causes of cardiac arrest, consider Hs/Ts:  Hypothermia/hyperthermia - patient normothermic Hypovolemia - Had 2L pulled off at dialysis, but is normotensive at this time, no rreport of hemorrhage Hypoxia -  stated that he was SOB earlier today but there was no report of respiratory distress or notable hypoxia prior to his cardiac arrest.  He is now satting 100% on room air, protecting his own airway. Hypo- or hyperkalemia -patient is an ESRD patient and was on dialysis.  He was given calcium chloride 1 g at dialysis.  No evidence of hyperK on EMS's EKGs.  H+ (acidosis) -we will test with labs Tamponade -bedside ultrasound did not demonstrate any pericardial effusion Tension PTX -patient has bilateral breath sounds but will evaluate with chest x-ray Thrombosis (ACS, PE) -patient complaining of chest pain, no evidence of STEMI on EMS's EKGs Toxins - Will get UDS and serum tox  Patient c/o severe chest pain, consider STEMI vs PE vs tamponade. EKG with no STEs/Ds. No peaked T waves to indicate severe hyperkalemia, either. BS echo did not demonstrate dilated right ventricle, D-sign, pericardial effusion/tamponade, or wall motion abnormalities. Patient is very TTP, will obtain CXR to evaluate for rib fractures or sternal fracture as a result of CPR. For further updates please see ED course.   I have personally reviewed and interpreted all labs and imaging.   ED Course:  Clinical Course as of 09/08/22 1603   Mon Sep 08, 2022  1153 Patient is A&O x1, confused at this time, unable to give consent for a contrasted CT scan.  I believe that the benefits of a contrasted chest abdomen pelvis CT in the setting of a postcode presentation with chest pain or shortness of breath out weigh the risks [HN]  1154 . [HN]  1154 Potassium: 3.5 [HN]  1154 Glucose(!): 154 [HN]  1206 Hemoglobin(!): 7.7 Down from BL 8-10 [HN]  1206 INR(!): 1.3 [HN]  1206 Prothrombin Time(!): 16.1 [HN]  1206 WBC: 9.3 No leukocytosis [HN]  1218 Lactic Acid, Venous(!!): 5.0 [HN]  1304 B Natriuretic Peptide(!): 2,787.4 [HN]  9323 DG Chest Portable 1 View Cardiomegaly with pulmonary vascular congestion and mild interstitial edema.   [HN]  1400 Patient was wheezing and slightly tachypneic though denied SOB. Given solumedrol and duoneb. [HN]  5573 ICU has evaluated patient. Agree no clear etiology for arrest. States patient is okay for stepdown, will consult to hospitalist with critical care following. [HN]  1507 CT CHEST ABDOMEN PELVIS W CONTRAST There are recent fractures in the anterior aspect of left and right third, fourth, fifth and sixth ribs. There is no pneumothorax. Major vascular structures in the mediastinum appear intact.   Small right pleural effusion. There are patchy infiltrates in mid and lower lung fields suggesting atelectasis/pneumonia.   Cardiomegaly.  Coronary artery disease.  Small pericardial effusion.   There is no laceration in solid organs. Fatty liver. Right renal cysts.   There is mild diffuse wall thickening in rectosigmoid patient may be due to incomplete distention or suggest nonspecific mild colitis.   There is no evidence of intestinal obstruction or pneumoperitoneum. There is no hydronephrosis in the right kidney. There is previous left nephrectomy.  [HN]  1508 D/w hospitalist who will admit patient  to stepdown. Pain control with dilaudid though appears patient still is splinting his rib  fractures. [HN]    Clinical Course User Index [HN] Audley Hose, MD    Troponin elevated to 54 - 606 not unexpected in s/o CPR. EKG w/o ischemia. K wnl, no acidosis. BNP elevated to 2,787 - consider HF exacerbation. Earlier BSUS had good contractility but was s/p code dose epinephrine at that point. CT head without any abnormalities.  Chest abdomen pelvis imaging significant for bilateral third fourth fifth and sixth rib fractures.  Mild colitis, cardiomegaly, mild patchy infiltrates in the mid and lower lung fields with small right pleural effusion.  Dispo: Admitted to stepdown for further w/u and management.         Final Clinical Impression(s) / ED Diagnoses Final diagnoses:  Cardiac arrest ([place])  Closed fracture of multiple ribs of both sides, initial encounter  Retrograde amnesia  Elevated brain natriuretic peptide (BNP) level  ESRD (end stage renal disease) on dialysis [facility])    Rx / DC Orders ED Discharge Orders     None        This note was created using dictation software, which may contain spelling or grammatical errors.    Audley Hose, MD 09/08/22 [phone]

## 2022-09-08 NOTE — Consult Note (Signed)
NAME:  Albert Eaton, MRN:  [id number], DOB:  August 09, 1966, LOS: 0 ADMISSION DATE:  09/08/2022, CONSULTATION DATE:  11/13 REFERRING MD: Dr Mayra Neer , CHIEF COMPLAINT:   SOB  History of Present Illness:  56 year old male with extensive medical history as outlined below, which is significant for ESRD on HD, HFpEF, HIV, hepatitis, HTN, OSA, and mitral regurg. He was in his usual state of health with the exception of a gout flare 11/12. Then presented to [facility] ED 11/13 s/p cardiac arrest at the dialysis center. He does not recall any events leading up to the arrest. Does not even recall waking up. Last thing he remembers is dinner the night before. Reportedly he presented to dialysis as usual with no complaints. 2.3 L were removed. He quickly became less responsive, developed agonal respirations, and lost pulses. CPR in total was 2 minutes in duration, during which time he received epi x 2 and calcium. Upon arrival to the ED he was awake and communicating with staff. Remained hemodynamically stable. Complaining of severe atypical chest pain refractory to opioids. Cardiac workup in ED was unremarkable. No clear cause of arrest elicited. PCCM asked to evaluate the patient for admission.   Pertinent  Medical History   has a past medical history of Anemia, Aortic atherosclerosis ([place]), Arthritis, Asthma, Cancer ([place]), Chronic diastolic CHF (congestive heart failure) ([place]) (01/06/2017), Esophagitis, ESRD (end stage renal disease) on dialysis ([place]), GERD (gastroesophageal reflux disease), Gout, Hepatitis, HIV (human immunodeficiency virus infection) ([place]), HLD (hyperlipidemia), Hypertension, Hypothyroidism, Mitral regurgitation, Neuropathy, Sleep apnea, Wears glasses, and Wears partial dentures.   Significant Hospital Events: Including procedures, antibiotic start and stop dates in addition to other pertinent events   11/13 admit s/p cardiac arrest. Alert, oriented, and hemodynamically stable in ED luckily.    Interim History / Subjective:    Objective   Blood pressure (!) 137/94, pulse 87, temperature 98.9 F (37.2 C), temperature source Oral, resp. rate 19, height 5' 10"  (1.778 m), weight 90.3 kg, SpO2 100 %.        Intake/Output Summary (Last 24 hours) at 09/08/2022 1350 Last data filed at 09/08/2022 1339 Gross per 24 hour  Intake 250 ml  Output --  Net 250 ml   Filed Weights   09/08/22 1137  Weight: 90.3 kg    Examination: General: Overweight middle aged male in NAD HENT: [place]/AT, PERRL, no JVD Lungs: Clear bilateral breath sounds.  Cardiovascular: RRR, no MRG Abdomen: Soft, non-distended. Palpation of the abdomen elicits severe pain in the chest wall.  Extremities: No acute deformity or ROM limitation.  Neuro: Alert, oriented, non-focal. Non-focal.   Resolved Hospital Problem list     Assessment & Plan:   Cardiac arrest: Etiology unclear. Ischemia workup in ED so far is benign. Troponin lower than on previous admissions. CT head, chest, abdomen, and pelvis without clear etiology. ? Hypovolemia. Has recent echo from 10/27 showing LVEF 45-50% with global hypokinesis. Grade 2 DD. Mild RV dysfunction with moderately enlarged ventricle and intraventricular flattening c/w increased RV pressures/overload. Small pericardial effusion on CT today.  - Will require admission for further evaluation - No clear ICU needs at this time.  - Echocardiogram - Continue to trend cardiac enzymes.  - Recommend cardiology consult  RLL atelectasis vs pneumonia: likely secondary to aspiration in the setting of cardiac arrest. May simply be atelecatsis due to splinting/hypoventilation.  - Empiric coverage for aspiration with ceftriaxone, but low threshold to escalate to HAP coverage with two recent admissions.  -  Incentive spirometry - Pain management  ESRD on HD: -trend BMP -recommend nephrology consult  AF on eliquis - AC per primary  Asthma without exacerbation OSA - CPAP - Continue  home symbicort. If difficulty adequately inhaling d/t pain would change to duonebs and budesonide nebs.   Chest pain: atypical. Exacerbated by any movement or palpation. CT chest demonstrates multiple bilateral rib fractures.  - s/p fentanyl and dilaudid in the ED without much relief.  - Place lidocaine patch - pain management per primary. Avoid morphine in ESRD.  - Good pulmonary hygiene including aggressive incentive spirometry  Best Practice (right click and "Reselect all SmartList Selections" daily)  Per primary  Labs   CBC: Recent Labs  Lab 09/08/22 1138 09/08/22 1147  WBC 9.3  --   NEUTROABS 5.2  --   HGB 7.7* 8.2*  HCT 24.2* 24.0*  MCV 100.0  --   PLT 269  --     Basic Metabolic Panel: Recent Labs  Lab 09/08/22 1138 09/08/22 1147  NA 141 139  K 3.5 3.5  CL 102 104  CO2 24  --   GLUCOSE 158* 154*  BUN 19 21*  CREATININE 6.67* 7.50*  CALCIUM 10.0  --   MG 1.7  --    GFR: Estimated Creatinine Clearance: 12.4 mL/min (A) (by C-G formula based on SCr of 7.5 mg/dL (H)). Recent Labs  Lab 09/08/22 1138  WBC 9.3  LATICACIDVEN 5.0*    Liver Function Tests: Recent Labs  Lab 09/08/22 1138  AST 80*  ALT 57*  ALKPHOS 88  BILITOT 0.2*  PROT 6.6  ALBUMIN 3.0*   Recent Labs  Lab 09/08/22 1138  LIPASE 69*   No results for input(s): "AMMONIA" in the last 168 hours.  ABG    Component Value Date/Time   PHART 7.390 08/22/2022 0459   PCO2ART 32.2 08/22/2022 0459   PO2ART 137 (H) 08/22/2022 0459   HCO3 19.5 (L) 08/22/2022 0459   TCO2 22 09/08/2022 1147   ACIDBASEDEF 5.0 (H) 08/22/2022 0459   O2SAT 99 08/22/2022 0459     Coagulation Profile: Recent Labs  Lab 09/08/22 1138  INR 1.3*    Cardiac Enzymes: No results for input(s): "CKTOTAL", "CKMB", "CKMBINDEX", "TROPONINI" in the last 168 hours.  HbA1C: HbA1c, POC (prediabetic range)  Date/Time Value Ref Range Status  05/01/2020 10:23 AM 5.3 (A) 5.7 - 6.4 % Final   HbA1c, POC (controlled diabetic  range)  Date/Time Value Ref Range Status  05/01/2020 10:23 AM 5.3 0.0 - 7.0 % Final   HbA1c POC (<> result, manual entry)  Date/Time Value Ref Range Status  05/01/2020 10:23 AM 5.3 4.0 - 5.6 % Final   Hgb A1c MFr Bld  Date/Time Value Ref Range Status  08/22/2022 09:01 AM 5.6 4.8 - 5.6 % Final    Comment:    (NOTE) Pre diabetes:          5.7%-6.4%  Diabetes:              >6.4%  Glycemic control for   <7.0% adults with diabetes   08/19/2022 11:20 AM 5.1 4.8 - 5.6 % Final    Comment:    (NOTE) Pre diabetes:          5.7%-6.4%  Diabetes:              >6.4%  Glycemic control for   <7.0% adults with diabetes     CBG: Recent Labs  Lab 09/08/22 1215  GLUCAP 107*    Review of  Systems:   Bolds are positive  Constitutional: weight loss, gain, night sweats, Fevers, chills, fatigue .  HEENT: headaches, Sore throat, sneezing, nasal congestion, post nasal drip, Difficulty swallowing, Tooth/dental problems, visual complaints visual changes, ear ache CV:  chest pain, radiates:,Orthopnea, PND, swelling in lower extremities**, dizziness, palpitations, syncope.  GI  heartburn, indigestion, abdominal pain, nausea, vomiting, diarrhea, change in bowel habits, loss of appetite, bloody stools.  Resp: cough, productive: , hemoptysis, dyspnea, chest pain, pleuritic.  Skin: rash or itching or icterus GU: dysuria, change in color of urine, urgency or frequency. flank pain, hematuria  MS: joint pain or swelling. decreased range of motion. Musculoskeletal chest pain.  Psych: change in mood or affect. depression or anxiety.  Neuro: difficulty with speech, weakness, numbness, ataxia    Past Medical History:  He,  has a past medical history of Anemia, Aortic atherosclerosis ([place]), Arthritis, Asthma, Cancer ([place]), Chronic diastolic CHF (congestive heart failure) ([place]) (01/06/2017), Esophagitis, ESRD (end stage renal disease) on dialysis ([place]), GERD (gastroesophageal reflux disease), Gout,  Hepatitis, HIV (human immunodeficiency virus infection) ([place]), HLD (hyperlipidemia), Hypertension, Hypothyroidism, Mitral regurgitation, Neuropathy, Sleep apnea, Wears glasses, and Wears partial dentures.   Surgical History:   Past Surgical History:  Procedure Laterality Date   AV FISTULA PLACEMENT Left 07/02/2018   Procedure: Creation of Left arm BRACHIOBASILIC ARTERIOVENOUS  FISTULA;  Surgeon: Marty Heck, MD;  Location: [place];  Service: Vascular;  Laterality: Left;   [place] Left 08/27/2018   Procedure: Left arm BRACHIOBASILIC VEIN TRANSPOSITION SECOND STAGE;  Surgeon: Marty Heck, MD;  Location: [place];  Service: Vascular;  Laterality: Left;   BUBBLE STUDY  09/03/2020   Procedure: BUBBLE STUDY;  Surgeon: Fay Records, MD;  Location: [place];  Service: Cardiovascular;;   BUBBLE STUDY  06/03/2022   Procedure: BUBBLE STUDY;  Surgeon: Elouise Munroe, MD;  Location: [place];  Service: Cardiology;;   CAPD INSERTION N/A 05/30/2021   Procedure: LAPAROSCOPIC INSERTION CONTINUOUS AMBULATORY PERITONEAL DIALYSIS  (CAPD) CATHETER;  Surgeon: [name], MD;  Location: [place];  Service: Vascular;  Laterality: N/A;   CAPD REMOVAL N/A 02/13/2022   Procedure: PERITONEAL DIALYSIS CATHETER REMOVAL;  Surgeon: [name], MD;  [facility];  Service: Vascular;  Laterality: N/A;   CHOLECYSTECTOMY     COLONOSCOPY W/ BIOPSIES AND POLYPECTOMY     GIVENS CAPSULE STUDY N/A 03/01/2018   Procedure: GIVENS CAPSULE STUDY;  Surgeon: Jerene Bears, MD;  Location: [place];  Service: Gastroenterology;  Laterality: N/A;   HERNIA REPAIR     As baby   MITRAL VALVE REPAIR N/A 08/21/2022   Procedure: MINIMALLY INVASIVE MITRAL VALVE REPAIR USING RING ANNULOPLASTY SIMULUS 56m;  Surgeon: WCoralie Common MD;  [place]  Service: Open [place] Surgery;  Laterality: N/A;   MULTIPLE TOOTH EXTRACTIONS     NEPHRECTOMY Left 02/18/2021   NOSE SURGERY     RENAL  BIOPSY     RIGHT/LEFT HEART CATH AND CORONARY ANGIOGRAPHY N/A 09/04/2021   Procedure: RIGHT/LEFT HEART CATH AND CORONARY ANGIOGRAPHY;  Surgeon: MBurnell Blanks MD;  [facility];  Service: Cardiovascular;  Laterality: N/A;   RIGHT/LEFT HEART CATH AND CORONARY ANGIOGRAPHY N/A 08/01/2022   Procedure: RIGHT/LEFT HEART CATH AND CORONARY ANGIOGRAPHY;  Surgeon: CSherren Mocha MD;  [facility];  Service: Cardiovascular;  Laterality: N/A;   TEE WITHOUT CARDIOVERSION N/A 09/03/2020   Procedure: TRANSESOPHAGEAL ECHOCARDIOGRAM (TEE);  Surgeon: RFay Records MD;  [facility];  Service: Cardiovascular;  Laterality: N/A;   TEE WITHOUT CARDIOVERSION N/A 06/03/2022   Procedure: TRANSESOPHAGEAL ECHOCARDIOGRAM (TEE);  Surgeon: Elouise Munroe, MD;  Location: [facility];  Service: Cardiology;  Laterality: N/A;   TEE WITHOUT CARDIOVERSION N/A 08/21/2022   Procedure: TRANSESOPHAGEAL ECHOCARDIOGRAM (TEE);  Surgeon: Coralie Common, MD;  Location: [place];  Service: Open Heart Surgery;  Laterality: N/A;   UPPER GI ENDOSCOPY  04/05/2021     Social History:   reports that he quit smoking about 23 years ago. His smoking use included cigarettes. He has a 18.00 pack-year smoking history. He has never used smokeless tobacco. He reports that he does not currently use alcohol. He reports that he does not use drugs.   Family History:  His family history includes Alcohol abuse in his father; Heart attack (age of onset: 69) in his maternal grandmother; Heart failure in his mother; Hypertension in his brother, father, mother, sister, and sister; Multiple sclerosis in his sister; Scoliosis in an other family member. There is no history of Allergic rhinitis, Angioedema, Asthma, Eczema, Immunodeficiency, Urticaria, Colon cancer, Pancreatic cancer, or Esophageal cancer.   Allergies Allergies  Allergen Reactions   Ace Inhibitors Cough and Other (See Comments)     Home  Medications  Prior to Admission medications   Medication Sig Start Date End Date Taking? Authorizing Provider  ACETAMINOPHEN EXTRA STRENGTH 500 MG tablet Take 1,000 mg by mouth every 6 (six) hours as needed (pain.). 02/20/21   [provider]  amiodarone (PACERONE) 200 MG tablet Take 1 tablet (200 mg total) by mouth 2 (two) times daily. X 7 days, then decrease to 200 mg daily 08/27/22   Barrett, Erin R, PA-C  amLODipine (NORVASC) 10 MG tablet Take 1 tablet (10 mg total) by mouth daily. Patient taking differently: Take 10 mg by mouth daily after lunch. 10/31/16   [name], FNP  apixaban (ELIQUIS) 5 MG TABS tablet Take 1 tablet (5 mg total) by mouth 2 (two) times daily. 08/31/22 10/30/22  Elodia Florence., MD  azelastine (ASTELIN) 0.1 % nasal spray Place 2 sprays into both nostrils 2 (two) times daily as needed for allergies. 05/13/22   Valentina Shaggy, MD  B Complex-C-Folic Acid (DIALYVITE PO) Take 1 tablet by mouth daily.    [provider]  B Complex-C-Zn-Folic Acid (DIALYVITE 341-PFXT 15 PO) Take 1 tablet by mouth daily.    [provider]  BIKTARVY 50-200-25 MG TABS tablet TAKE ONE TABLET BY MOUTH EVERY DAY 06/26/22   Campbell Riches, MD  budesonide-formoterol [facility]) 160-4.5 MCG/ACT inhaler Inhale 2 puffs into the lungs daily. 08/06/22   Valentina Shaggy, MD  calcitRIOL (ROCALTROL) 0.25 MCG capsule Take 7 capsules (1.75 mcg total) by mouth every Monday, Wednesday, and Friday with hemodialysis. 08/27/22   Barrett, Erin R, PA-C  desloratadine (CLARINEX) 5 MG tablet Take 1 tablet (5 mg total) by mouth daily. Patient taking differently: Take 5 mg by mouth every evening. 05/13/22 07/28/24  Valentina Shaggy, MD  famotidine (PEPCID) 20 MG tablet Take 1 tablet (20 mg total) by mouth 2 (two) times daily. Patient taking differently: Take 20 mg by mouth 2 (two) times daily as needed for indigestion or heartburn. 02/12/22   [name], MD  ferric  citrate (AURYXIA) 1 GM 210 MG(Fe) tablet Take 420 mg by mouth See admin instructions. Take 420 mg by mouth three times a day with meals and 210 mg with each snack    [provider]  fluticasone (FLOVENT HFA) 110 MCG/ACT inhaler Inhale 2 puffs into the lungs 2 (two) times daily as needed ("for flares").    [provider]  gabapentin (NEURONTIN) 300 MG capsule TAKE 1 CAPSULE(300 MG) BY MOUTH THREE TIMES DAILY Patient taking differently: Take 300 mg by mouth 3 (three) times daily as needed (pain/neuropathy). 10/09/20   [name], FNP  ipratropium (ATROVENT) 0.06 % nasal spray USE 2 SPRAYS IN EACH NOSTRIL 2 TO 3 TIMES DAILY AS NEEDED Patient taking differently: Place 2 sprays into both nostrils See admin instructions. Instill 2 sprays into each nostril 2-3 times a day as needed for rhinitis or allergies 05/13/22   Valentina Shaggy, MD  levalbuterol [facility] HFA) 45 MCG/ACT inhaler Inhale 2 puffs into the lungs every 4 (four) hours as needed for wheezing. 06/05/22   Valentina Shaggy, MD  levothyroxine (SYNTHROID) 25 MCG tablet Take 25 mcg by mouth daily before breakfast. 03/31/22   [provider]  lubiprostone (AMITIZA) 24 MCG capsule Take 1 capsule (24 mcg total) by mouth 2 (two) times daily with a meal. NEEDS OFFICE VISIT FOR FURTHER REFILLS 03/03/22   Pyrtle, Lajuan Lines, MD  Methoxy PEG-Epoetin Beta (MIRCERA IJ) Inject 1 each into the skin every 30 (thirty) days. Administered at dialysis center once a month 11/08/20   [provider]  metoprolol tartrate (LOPRESSOR) 25 MG tablet Take 1 tablet (25 mg total) by mouth 2 (two) times daily. 08/27/22   Barrett, Erin R, PA-C  Olopatadine HCl (PATADAY) 0.7 % SOLN Place 1 drop into both eyes 2 (two) times daily as needed (allergies).    [provider]  oxyCODONE (OXY IR/ROXICODONE) 5 MG immediate release tablet Take 1 tablet (5 mg total) by mouth every 4 (four) hours as needed for severe pain. 08/27/22   Barrett,  Erin R, PA-C  pantoprazole (PROTONIX) 40 MG tablet Take 1 tablet (40 mg total) by mouth daily. **PLEASE CONTACT THE OFFICE TO SCHEDULE FOLLOW UP Patient taking differently: Take 40 mg by mouth at bedtime. 03/27/22   Esterwood, Amy S, PA-C  rOPINIRole (REQUIP) 1 MG tablet TAKE ONE TABLET BY MOUTH AT BEDTIME Patient taking differently: Take 1 mg by mouth at bedtime. 01/15/22   Olalere, Cicero Duck A, MD  rosuvastatin (CRESTOR) 10 MG tablet TAKE ONE TABLET BY MOUTH DAILY Patient taking differently: Take 10 mg by mouth daily. 12/13/21   [name], MD  SENSIPAR 60 MG tablet Take 60 mg by mouth See admin instructions. Take 60 mg by mouth with supper after dialysis treatments on Mon/Wed/Fri 06/25/22   [provider]  Spacer/Aero-Holding Chambers DEVI 1 Device by Does not apply route 2 (two) times a day. 03/01/19   Bobbitt, Sedalia Muta, MD  Vitamin D, Ergocalciferol, (DRISDOL) 1.25 MG (50000 UNIT) CAPS capsule Take 50,000 Units by mouth every Tuesday.    [provider]      Georgann Housekeeper, AGACNP-BC [facility]  See Amion for personal pager PCCM on call pager [phone] until 7pm. Please call Elink 7p-7a. [phone]  09/08/2022 5:07 PM

## 2022-09-08 NOTE — ED Triage Notes (Signed)
Patient coming from dialysis after having cardiac arrest .  Dialysis center administered 2 mins cpr with 2 epis and 1 gram calcium.  Per EMS rhythm on monitor was ST 110 with no radial or carotid pulses.  Patient initially had agonal breathinig.  Patient came around after 2 mins of CPR and has been awake and alert during transport.  Patient complaining of CP due to CPR and was given 12mg fentanyl.  Patient had 2352 cc fluid pulled off today at dialysis.  Patient is awake alert and oriented.

## 2022-09-08 NOTE — Progress Notes (Signed)
Heart Failure Navigator Progress Note  Assessed for Heart & Vascular TOC clinic readiness.  Patient does not meet criteria due to ESRD, patient on hemodialysis.     Earnestine Leys, BSN, Clinical cytogeneticist Only

## 2022-09-08 NOTE — Consult Note (Signed)
[facility] Renal Consultation Note    Indication for Consultation:  Management of ESRD/hemodialysis; anemia, hypertension/volume and secondary hyperparathyroidism   HPI: Albert Eaton is a 56 y.o. male with history of ESRD on HD, chronic diastolic CHF, HTN, HIV, Hep B, OSA, severe MR s/p recent mitral valve repair complicated by CVA and respiratory arrest. Most recently discharged from [facility] 08/31/22 with post op chest pain and new onset atrial flutter with RVR. He was started on Eliquis and referred to the [facility].   Presents to ED today after cardiac arrest at dialysis center. Per notes became pulseless and unresponsive. He received 2 minutes of CPR with 2 epi and 1 g of calcium given with ROSC. Monitor showed SVT vs A-Fib. CXR with mild pulmonary edema. SR on EKG here. Labs: Na 139, K 3.5, CO2 24, Ca 10, Hb 8.2  HS Troponin 54>606, Lactic acid 2.4.   HD MWF at [facility]. Completed 2:56 of treatment today with 2L UF. Lowest recorded BP 104/70.  Left 5kg over his dry weight.   Seen and examined in the ED. He doesn't remember anything that happened at dialysis. His chest is very sore. He's breathing ok. No dizziness, HA, nausea/vomiting.   Past Medical History:  Diagnosis Date   Anemia    Aortic atherosclerosis (HCC)    Arthritis    Asthma    Cancer ([place])    renal cyst   Chronic diastolic CHF (congestive heart failure) ([place]) 01/06/2017   Echo 7/16 [facility] in [place], [name]) Mild AI, mild LAE, mild concentric LVH, EF 55, normal wall motion, mild to moderate MR, mild PI, RVSP 55 // Echo 10/09/16 ([facility]):  Moderate LVH, grade 2 diastolic dysfunction, mild MR, moderate LAE    Esophagitis    ESRD (end stage renal disease) on dialysis [facility])    Dialysis Mon Wed Fri   GERD (gastroesophageal reflux disease)    Gout    no current problems   Hepatitis    Hep B   HIV (human immunodeficiency virus infection) ([place])    HLD (hyperlipidemia)     Hypertension    Hypothyroidism    Mitral regurgitation    Neuropathy    Sleep apnea    uses c-pap   Wears glasses    Wears partial dentures    Past Surgical History:  Procedure Laterality Date   AV FISTULA PLACEMENT Left 07/02/2018   Procedure: Creation of Left arm BRACHIOBASILIC ARTERIOVENOUS  FISTULA;  Surgeon: Marty Heck, MD;  Location: [place];  Service: Vascular;  Laterality: Left;   [place] Left 08/27/2018   Procedure: Left arm BRACHIOBASILIC VEIN TRANSPOSITION SECOND STAGE;  Surgeon: Marty Heck, MD;  Location: [place];  Service: Vascular;  Laterality: Left;   BUBBLE STUDY  09/03/2020   Procedure: BUBBLE STUDY;  Surgeon: Fay Records, MD;  Location: [place];  Service: Cardiovascular;;   BUBBLE STUDY  06/03/2022   Procedure: BUBBLE STUDY;  Surgeon: Elouise Munroe, MD;  Location: [place];  Service: Cardiology;;   CAPD INSERTION N/A 05/30/2021   Procedure: LAPAROSCOPIC INSERTION CONTINUOUS AMBULATORY PERITONEAL DIALYSIS  (CAPD) CATHETER;  Surgeon: [name], MD;  Location: [place];  Service: Vascular;  Laterality: N/A;   CAPD REMOVAL N/A 02/13/2022   Procedure: PERITONEAL DIALYSIS CATHETER REMOVAL;  Surgeon: [name], MD;  [facility];  Service: Vascular;  Laterality: N/A;   CHOLECYSTECTOMY     COLONOSCOPY W/ BIOPSIES AND POLYPECTOMY  GIVENS CAPSULE STUDY N/A 03/01/2018   Procedure: GIVENS CAPSULE STUDY;  Surgeon: Jerene Bears, MD;  Location: [place];  Service: Gastroenterology;  Laterality: N/A;   HERNIA REPAIR     As baby   MITRAL VALVE REPAIR N/A 08/21/2022   Procedure: MINIMALLY INVASIVE MITRAL VALVE REPAIR USING RING ANNULOPLASTY SIMULUS 29m;  Surgeon: WCoralie Common MD;  [place]  Service: Open Heart Surgery;  Laterality: N/A;   MULTIPLE TOOTH EXTRACTIONS     NEPHRECTOMY Left 02/18/2021   NOSE SURGERY     RENAL BIOPSY     RIGHT/LEFT HEART CATH AND CORONARY ANGIOGRAPHY N/A 09/04/2021    Procedure: RIGHT/LEFT HEART CATH AND CORONARY ANGIOGRAPHY;  Surgeon: MBurnell Blanks MD;  [place];  Service: Cardiovascular;  Laterality: N/A;   RIGHT/LEFT HEART CATH AND CORONARY ANGIOGRAPHY N/A 08/01/2022   Procedure: RIGHT/LEFT HEART CATH AND CORONARY ANGIOGRAPHY;  Surgeon: CSherren Mocha MD;  [facility];  Service: Cardiovascular;  Laterality: N/A;   TEE WITHOUT CARDIOVERSION N/A 09/03/2020   Procedure: TRANSESOPHAGEAL ECHOCARDIOGRAM (TEE);  Surgeon: RFay Records MD;  [place]  Service: Cardiovascular;  Laterality: N/A;   TEE WITHOUT CARDIOVERSION N/A 06/03/2022   Procedure: TRANSESOPHAGEAL ECHOCARDIOGRAM (TEE);  Surgeon: AElouise Munroe MD;  [place]  Service: Cardiology;  Laterality: N/A;   TEE WITHOUT CARDIOVERSION N/A 08/21/2022   Procedure: TRANSESOPHAGEAL ECHOCARDIOGRAM (TEE);  Surgeon: WCoralie Common MD;  [place]  Service: Open Heart Surgery;  Laterality: N/A;   UPPER GI ENDOSCOPY  04/05/2021   Family History  Problem Relation Age of Onset   Hypertension Mother    Heart failure Mother    Hypertension Father    Alcohol abuse Father    Hypertension Sister    Multiple sclerosis Sister    Hypertension Sister    Hypertension Brother    Heart attack Maternal Grandmother 53   Scoliosis Other    Allergic rhinitis Neg Hx    Angioedema Neg Hx    Asthma Neg Hx    Eczema Neg Hx    Immunodeficiency Neg Hx    Urticaria Neg Hx    Colon cancer Neg Hx    Pancreatic cancer Neg Hx    Esophageal cancer Neg Hx    Social History:  reports that he quit smoking about 23 years ago. His smoking use included cigarettes. He has a 18.00 pack-year smoking history. He has never used smokeless tobacco. He reports that he does not currently use alcohol. He reports that he does not use drugs. Allergies  Allergen Reactions   Ace Inhibitors Cough and Other (See Comments)   Prior to Admission medications    Medication Sig Start Date End Date Taking? Authorizing Provider  ACETAMINOPHEN EXTRA STRENGTH 500 MG tablet Take 1,000 mg by mouth every 6 (six) hours as needed (pain.). 02/20/21   [provider]  amiodarone (PACERONE) 200 MG tablet Take 1 tablet (200 mg total) by mouth 2 (two) times daily. X 7 days, then decrease to 200 mg daily 08/27/22   Barrett, Erin R, PA-C  amLODipine (NORVASC) 10 MG tablet Take 1 tablet (10 mg total) by mouth daily. Patient taking differently: Take 10 mg by mouth daily after lunch. 10/31/16   HDorena Dew FNP  apixaban (ELIQUIS) 5 MG TABS tablet Take 1 tablet (5 mg total) by mouth 2 (two) times daily. 08/31/22 10/30/22  PElodia Florence, MD  azelastine (ASTELIN) 0.1 % nasal spray Place 2 sprays into  both nostrils 2 (two) times daily as needed for allergies. 05/13/22   Valentina Shaggy, MD  B Complex-C-Folic Acid (DIALYVITE PO) Take 1 tablet by mouth daily.    [provider]  B Complex-C-Zn-Folic Acid (DIALYVITE 759-FMBW 15 PO) Take 1 tablet by mouth daily.    [provider]  BIKTARVY 50-200-25 MG TABS tablet TAKE ONE TABLET BY MOUTH EVERY DAY 06/26/22   Campbell Riches, MD  budesonide-formoterol [facility]) 160-4.5 MCG/ACT inhaler Inhale 2 puffs into the lungs daily. 08/06/22   Valentina Shaggy, MD  calcitRIOL (ROCALTROL) 0.25 MCG capsule Take 7 capsules (1.75 mcg total) by mouth every Monday, Wednesday, and Friday with hemodialysis. 08/27/22   Barrett, Erin R, PA-C  desloratadine (CLARINEX) 5 MG tablet Take 1 tablet (5 mg total) by mouth daily. Patient taking differently: Take 5 mg by mouth every evening. 05/13/22 07/28/24  Valentina Shaggy, MD  famotidine (PEPCID) 20 MG tablet Take 1 tablet (20 mg total) by mouth 2 (two) times daily. Patient taking differently: Take 20 mg by mouth 2 (two) times daily as needed for indigestion or heartburn. 02/12/22   [name], MD  ferric citrate (AURYXIA) 1 GM 210 MG(Fe) tablet Take 420  mg by mouth See admin instructions. Take 420 mg by mouth three times a day with meals and 210 mg with each snack    [provider]  fluticasone (FLOVENT HFA) 110 MCG/ACT inhaler Inhale 2 puffs into the lungs 2 (two) times daily as needed ("for flares").    [provider]  gabapentin (NEURONTIN) 300 MG capsule TAKE 1 CAPSULE(300 MG) BY MOUTH THREE TIMES DAILY Patient taking differently: Take 300 mg by mouth 3 (three) times daily as needed (pain/neuropathy). 10/09/20   [name], FNP  ipratropium (ATROVENT) 0.06 % nasal spray USE 2 SPRAYS IN EACH NOSTRIL 2 TO 3 TIMES DAILY AS NEEDED Patient taking differently: Place 2 sprays into both nostrils See admin instructions. Instill 2 sprays into each nostril 2-3 times a day as needed for rhinitis or allergies 05/13/22   Valentina Shaggy, MD  levalbuterol [facility] HFA) 45 MCG/ACT inhaler Inhale 2 puffs into the lungs every 4 (four) hours as needed for wheezing. 06/05/22   Valentina Shaggy, MD  levothyroxine (SYNTHROID) 25 MCG tablet Take 25 mcg by mouth daily before breakfast. 03/31/22   [provider]  lubiprostone (AMITIZA) 24 MCG capsule Take 1 capsule (24 mcg total) by mouth 2 (two) times daily with a meal. NEEDS OFFICE VISIT FOR FURTHER REFILLS 03/03/22   Pyrtle, Lajuan Lines, MD  Methoxy PEG-Epoetin Beta (MIRCERA IJ) Inject 1 each into the skin every 30 (thirty) days. Administered at dialysis center once a month 11/08/20   [provider]  metoprolol tartrate (LOPRESSOR) 25 MG tablet Take 1 tablet (25 mg total) by mouth 2 (two) times daily. 08/27/22   Barrett, Erin R, PA-C  Olopatadine HCl (PATADAY) 0.7 % SOLN Place 1 drop into both eyes 2 (two) times daily as needed (allergies).    [provider]  oxyCODONE (OXY IR/ROXICODONE) 5 MG immediate release tablet Take 1 tablet (5 mg total) by mouth every 4 (four) hours as needed for severe pain. 08/27/22   Barrett, Erin R, PA-C  pantoprazole (PROTONIX) 40 MG  tablet Take 1 tablet (40 mg total) by mouth daily. **PLEASE CONTACT THE OFFICE TO SCHEDULE FOLLOW UP Patient taking differently: Take 40 mg by mouth at bedtime. 03/27/22   Esterwood, Amy S, PA-C  rOPINIRole (REQUIP) 1 MG tablet  TAKE ONE TABLET BY MOUTH AT BEDTIME Patient taking differently: Take 1 mg by mouth at bedtime. 01/15/22   Olalere, Cicero Duck A, MD  rosuvastatin (CRESTOR) 10 MG tablet TAKE ONE TABLET BY MOUTH DAILY Patient taking differently: Take 10 mg by mouth daily. 12/13/21   [name], MD  SENSIPAR 60 MG tablet Take 60 mg by mouth See admin instructions. Take 60 mg by mouth with supper after dialysis treatments on Mon/Wed/Fri 06/25/22   [provider]  Spacer/Aero-Holding Chambers DEVI 1 Device by Does not apply route 2 (two) times a day. 03/01/19   Bobbitt, Sedalia Muta, MD  Vitamin D, Ergocalciferol, (DRISDOL) 1.25 MG (50000 UNIT) CAPS capsule Take 50,000 Units by mouth every Tuesday.    [provider]   Current Facility-Administered Medications  Medication Dose Route Frequency Provider Last Rate Last Admin   0.9 %  sodium chloride infusion  250 mL Intravenous PRN Wynetta Fines T, MD       acetaminophen (TYLENOL) tablet 650 mg  650 mg Oral Q4H PRN Lequita Halt, MD       [START ON 09/09/2022] amiodarone (PACERONE) tablet 200 mg  200 mg Oral Daily Wynetta Fines T, MD       [START ON 09/09/2022] amLODipine (NORVASC) tablet 10 mg  10 mg Oral QPC lunch Wynetta Fines T, MD       apixaban [facility]) tablet 5 mg  5 mg Oral BID Wynetta Fines T, MD       azelastine (ASTELIN) 0.1 % nasal spray 2 spray  2 spray Each Nare BID PRN Lequita Halt, MD       bictegravir-emtricitabine-tenofovir AF (BIKTARVY) 50-200-25 MG per tablet 1 tablet  1 tablet Oral Daily Wynetta Fines T, MD       [START ON 09/10/2022] calcitRIOL (ROCALTROL) capsule 1.75 mcg  1.75 mcg Oral Q M,W,F-HD Wynetta Fines T, MD       cinacalcet [facility]) tablet 60 mg  60 mg Oral See admin instructions Lequita Halt, MD        famotidine (PEPCID) tablet 20 mg  20 mg Oral BID Wynetta Fines T, MD       ferric citrate (AURYXIA) tablet 420 mg  420 mg Oral See admin instructions Lequita Halt, MD       fluticasone (FLOVENT HFA) 110 MCG/ACT inhaler 2 puff  2 puff Inhalation BID PRN Wynetta Fines T, MD       gabapentin (NEURONTIN) capsule 300 mg  300 mg Oral TID PRN Wynetta Fines T, MD       HYDROmorphone (DILAUDID) injection 0.5 mg  0.5 mg Intravenous Q3H PRN Spero Geralds, MD   0.5 mg at 09/08/22 1520   ipratropium (ATROVENT) 0.06 % nasal spray 2 spray  2 spray Each Nare See admin instructions Wynetta Fines T, MD       ipratropium (ATROVENT) nebulizer solution 0.5 mg  0.5 mg Nebulization Q6H Wynetta Fines T, MD       levalbuterol [facility] HFA) inhaler 2 puff  2 puff Inhalation Q4H PRN Lequita Halt, MD       [START ON 09/09/2022] levothyroxine (SYNTHROID) tablet 25 mcg  25 mcg Oral QAC breakfast Wynetta Fines T, MD       lidocaine (LIDODERM) 5 % 1 patch  1 patch Transdermal Q24H Corey Harold, NP   1 patch at 09/08/22 1451   loratadine (CLARITIN) tablet 10 mg  10 mg Oral Daily Lequita Halt, MD  lubiprostone (AMITIZA) capsule 24 mcg  24 mcg Oral BID WC Lequita Halt, MD       metoprolol tartrate (LOPRESSOR) tablet 25 mg  25 mg Oral BID Lequita Halt, MD       mometasone-formoterol [facility]) 200-5 MCG/ACT inhaler 2 puff  2 puff Inhalation BID Wynetta Fines T, MD       oxyCODONE (Oxy IR/ROXICODONE) immediate release tablet 5 mg  5 mg Oral Q6H Spero Geralds, MD   5 mg at 09/08/22 1520   oxyCODONE (Oxy IR/ROXICODONE) immediate release tablet 5 mg  5 mg Oral Q4H PRN Lequita Halt, MD       pantoprazole (PROTONIX) EC tablet 40 mg  40 mg Oral Daily Wynetta Fines T, MD       rOPINIRole (REQUIP) tablet 1 mg  1 mg Oral QHS Wynetta Fines T, MD       rosuvastatin (CRESTOR) tablet 10 mg  10 mg Oral Daily Wynetta Fines T, MD       sodium chloride flush (NS) 0.9 % injection 3 mL  3 mL Intravenous Q12H Wynetta Fines T, MD       sodium chloride flush  (NS) 0.9 % injection 3 mL  3 mL Intravenous PRN Lequita Halt, MD       Current Outpatient Medications  Medication Sig Dispense Refill   ACETAMINOPHEN EXTRA STRENGTH 500 MG tablet Take 1,000 mg by mouth every 6 (six) hours as needed (pain.).     amiodarone (PACERONE) 200 MG tablet Take 1 tablet (200 mg total) by mouth 2 (two) times daily. X 7 days, then decrease to 200 mg daily 60 tablet 1   amLODipine (NORVASC) 10 MG tablet Take 1 tablet (10 mg total) by mouth daily. (Patient taking differently: Take 10 mg by mouth daily after lunch.) 30 tablet 1   apixaban (ELIQUIS) 5 MG TABS tablet Take 1 tablet (5 mg total) by mouth 2 (two) times daily. 60 tablet 1   azelastine (ASTELIN) 0.1 % nasal spray Place 2 sprays into both nostrils 2 (two) times daily as needed for allergies. 30 mL 5   B Complex-C-Folic Acid (DIALYVITE PO) Take 1 tablet by mouth daily.     B Complex-C-Zn-Folic Acid (DIALYVITE 161-WRUE 15 PO) Take 1 tablet by mouth daily.     BIKTARVY 50-200-25 MG TABS tablet TAKE ONE TABLET BY MOUTH EVERY DAY 30 tablet 5   budesonide-formoterol (SYMBICORT) 160-4.5 MCG/ACT inhaler Inhale 2 puffs into the lungs daily. 10.2 g 1   calcitRIOL (ROCALTROL) 0.25 MCG capsule Take 7 capsules (1.75 mcg total) by mouth every Monday, Wednesday, and Friday with hemodialysis. 60 capsule 3   desloratadine (CLARINEX) 5 MG tablet Take 1 tablet (5 mg total) by mouth daily. (Patient taking differently: Take 5 mg by mouth every evening.) 30 tablet 5   famotidine (PEPCID) 20 MG tablet Take 1 tablet (20 mg total) by mouth 2 (two) times daily. (Patient taking differently: Take 20 mg by mouth 2 (two) times daily as needed for indigestion or heartburn.) 180 tablet 3   ferric citrate (AURYXIA) 1 GM 210 MG(Fe) tablet Take 420 mg by mouth See admin instructions. Take 420 mg by mouth three times a day with meals and 210 mg with each snack     fluticasone (FLOVENT HFA) 110 MCG/ACT inhaler Inhale 2 puffs into the lungs 2 (two) times  daily as needed ("for flares").     gabapentin (NEURONTIN) 300 MG capsule TAKE 1 CAPSULE(300 MG) BY MOUTH THREE TIMES  DAILY (Patient taking differently: Take 300 mg by mouth 3 (three) times daily as needed (pain/neuropathy).) 90 capsule 3   ipratropium (ATROVENT) 0.06 % nasal spray USE 2 SPRAYS IN EACH NOSTRIL 2 TO 3 TIMES DAILY AS NEEDED (Patient taking differently: Place 2 sprays into both nostrils See admin instructions. Instill 2 sprays into each nostril 2-3 times a day as needed for rhinitis or allergies) 15 mL 5   levalbuterol (XOPENEX HFA) 45 MCG/ACT inhaler Inhale 2 puffs into the lungs every 4 (four) hours as needed for wheezing. 15 g 1   levothyroxine (SYNTHROID) 25 MCG tablet Take 25 mcg by mouth daily before breakfast.     lubiprostone (AMITIZA) 24 MCG capsule Take 1 capsule (24 mcg total) by mouth 2 (two) times daily with a meal. NEEDS OFFICE VISIT FOR FURTHER REFILLS 60 capsule 1   Methoxy PEG-Epoetin Beta (MIRCERA IJ) Inject 1 each into the skin every 30 (thirty) days. Administered at dialysis center once a month     metoprolol tartrate (LOPRESSOR) 25 MG tablet Take 1 tablet (25 mg total) by mouth 2 (two) times daily. 60 tablet 3   Olopatadine HCl (PATADAY) 0.7 % SOLN Place 1 drop into both eyes 2 (two) times daily as needed (allergies).     oxyCODONE (OXY IR/ROXICODONE) 5 MG immediate release tablet Take 1 tablet (5 mg total) by mouth every 4 (four) hours as needed for severe pain. 30 tablet 0   pantoprazole (PROTONIX) 40 MG tablet Take 1 tablet (40 mg total) by mouth daily. **PLEASE CONTACT THE OFFICE TO SCHEDULE FOLLOW UP (Patient taking differently: Take 40 mg by mouth at bedtime.) 30 tablet 0   rOPINIRole (REQUIP) 1 MG tablet TAKE ONE TABLET BY MOUTH AT BEDTIME (Patient taking differently: Take 1 mg by mouth at bedtime.) 90 tablet 2   rosuvastatin (CRESTOR) 10 MG tablet TAKE ONE TABLET BY MOUTH DAILY (Patient taking differently: Take 10 mg by mouth daily.) 90 tablet 3   SENSIPAR 60  MG tablet Take 60 mg by mouth See admin instructions. Take 60 mg by mouth with supper after dialysis treatments on Mon/Wed/Fri     Spacer/Aero-Holding Chambers DEVI 1 Device by Does not apply route 2 (two) times a day. 1 each 1   Vitamin D, Ergocalciferol, (DRISDOL) 1.25 MG (50000 UNIT) CAPS capsule Take 50,000 Units by mouth every Tuesday.       ROS: As per HPI otherwise negative.  Physical Exam: Vitals:   09/08/22 1455 09/08/22 1530 09/08/22 1545 09/08/22 1545  BP: 131/83 (!) 136/95 (!) 139/92   Pulse: 85 83 84   Resp: 17 18 15    Temp:    98.5 F (36.9 C)  TempSrc:    Oral  SpO2: 100% 100% 100%   Weight:      Height:         General: Appears comfortable, in no distress  Head: NCAT sclera not icteric MMM Neck: Supple. No JVD appreciated  Lungs: Clear bilaterally without wheezes, rales, or rhonchi. Normal WOB  Heart: RRR, no murmur, rub, or gallop  Abdomen: soft non-tender, bowel sounds normal, no masses  Lower extremities:without edema or ischemic changes, no open wounds  Neuro: A & O X 3. Moves all extremities spontaneously. Psych:  Responds to questions appropriately with a normal affect. Dialysis Access:  Labs: Basic Metabolic Panel: Recent Labs  Lab 09/08/22 1138 09/08/22 1147  NA 141 139  K 3.5 3.5  CL 102 104  CO2 24  --   GLUCOSE 158* 154*  BUN 19 21*  CREATININE 6.67* 7.50*  CALCIUM 10.0  --    Liver Function Tests: Recent Labs  Lab 09/08/22 1138  AST 80*  ALT 57*  ALKPHOS 88  BILITOT 0.2*  PROT 6.6  ALBUMIN 3.0*   Recent Labs  Lab 09/08/22 1138  LIPASE 69*   No results for input(s): "AMMONIA" in the last 168 hours. CBC: Recent Labs  Lab 09/08/22 1138 09/08/22 1147  WBC 9.3  --   NEUTROABS 5.2  --   HGB 7.7* 8.2*  HCT 24.2* 24.0*  MCV 100.0  --   PLT 269  --    Cardiac Enzymes: No results for input(s): "CKTOTAL", "CKMB", "CKMBINDEX", "TROPONINI" in the last 168 hours. CBG: Recent Labs  Lab 09/08/22 1215  GLUCAP 107*   Iron  Studies: No results for input(s): "IRON", "TIBC", "TRANSFERRIN", "FERRITIN" in the last 72 hours. Studies/Results: CT CHEST ABDOMEN PELVIS W CONTRAST  Result Date: 09/08/2022 CLINICAL DATA:  Post code, chest pain, shortness of breath EXAM: CT CHEST, ABDOMEN, AND PELVIS WITH CONTRAST TECHNIQUE: Multidetector CT imaging of the chest, abdomen and pelvis was performed following the standard protocol during bolus administration of intravenous contrast. RADIATION DOSE REDUCTION: This exam was performed according to the departmental dose-optimization program which includes automated exposure control, adjustment of the mA and/or kV according to patient size and/or use of iterative reconstruction technique. CONTRAST:  45m OMNIPAQUE IOHEXOL 350 MG/ML SOLN COMPARISON:  CT chest done on 08/29/2022 the FINDINGS: CT CHEST FINDINGS Cardiovascular: Extensive coronary artery calcifications are seen. There is metallic density in mitral annulus suggesting prosthetic cardiac valve. Heart is enlarged in size. Small pericardial effusion is present. There is homogeneous enhancement in thoracic aorta. There are no intraluminal filling defects in central pulmonary artery branches. Mediastinum/Nodes: There is no mediastinal hematoma. Lungs/Pleura: There is interval decrease in bilateral pleural effusions. Small patchy infiltrates are seen in both mid and both lower lung fields, more so on the right side. Small right pleural effusion is seen. Few blebs are seen in the left apex. There is no demonstrable pneumothorax. Musculoskeletal: Slightly displaced fractures are seen in the anterior aspects of left third, fourth, fifth and sixth ribs. There are undisplaced fractures in the anterior right third, fourth, fifth and sixth ribs CT ABDOMEN PELVIS FINDINGS Hepatobiliary: No focal abnormalities are seen. There is no dilation of bile ducts. Surgical clips are seen in gallbladder fossa. Pancreas: No focal abnormalities are seen. Spleen:  Unremarkable. Adrenals/Urinary Tract: Left kidney is not seen suggesting left nephrectomy. Adrenals are unremarkable. There is no hydronephrosis in the right kidney. There is 2.1 cm low-density structure in the parapelvic region in right kidney suggesting renal cyst. No follow-up is recommended. There is no right renal or right ureteral stone. Urinary bladder is not distended. Stomach/Bowel: Stomach is unremarkable. Small bowel loops are not dilated. Appendix is not dilated. There is no significant focal wall thickening in colon. There is mild diffuse wall thickening in sigmoid colon and rectum. There is no pericolic stranding. Vascular/Lymphatic: Arterial calcifications are seen. Reproductive: Prostate is not enlarged Other: There is no ascites or pneumoperitoneum. Bilateral inguinal hernias containing fat are seen. Musculoskeletal: Schmorl's nodes are seen in multiple lumbar and thoracic vertebral bodies. Multiple bilateral rib fractures as described earlier. IMPRESSION: There are recent fractures in the anterior aspect of left and right third, fourth, fifth and sixth ribs. There is no pneumothorax. Major vascular structures in the mediastinum appear intact. Small right pleural effusion. There are patchy infiltrates in mid and lower  lung fields suggesting atelectasis/pneumonia. Cardiomegaly.  Coronary artery disease.  Small pericardial effusion. There is no laceration in solid organs. Fatty liver. Right renal cysts. There is mild diffuse wall thickening in rectosigmoid patient may be due to incomplete distention or suggest nonspecific mild colitis. There is no evidence of intestinal obstruction or pneumoperitoneum. There is no hydronephrosis in the right kidney. There is previous left nephrectomy. Other findings as described in the body of the report. Electronically Signed   By: Elmer Picker M.D.   On: 09/08/2022 13:42   CT Head Wo Contrast  Result Date: 09/08/2022 CLINICAL DATA:  Anoxic brain damage  cpr EXAM: CT HEAD WITHOUT CONTRAST TECHNIQUE: Contiguous axial images were obtained from the base of the skull through the vertex without intravenous contrast. RADIATION DOSE REDUCTION: This exam was performed according to the departmental dose-optimization program which includes automated exposure control, adjustment of the mA and/or kV according to patient size and/or use of iterative reconstruction technique. COMPARISON:  08/22/2022 FINDINGS: Brain: Mild atrophy. No evidence of acute infarction, hemorrhage, hydrocephalus, extra-axial collection or mass lesion/mass effect. Vascular: Atherosclerotic and physiologic intracranial calcifications. Skull: Normal. Negative for fracture or focal lesion. Sinuses/Orbits: No acute finding. Other: None IMPRESSION: 1. No acute intracranial findings. 2. Mild atrophy. Electronically Signed   By: Lucrezia Europe M.D.   On: 09/08/2022 13:08   DG Chest Portable 1 View  Result Date: 09/08/2022 CLINICAL DATA:  CPR EXAM: PORTABLE CHEST 1 VIEW COMPARISON:  08/21/2022, 09/02/2022 FINDINGS: Patient is slightly rotated. Stable cardiomegaly. Prosthetic cardiac valve. Pulmonary vascular congestion. Perihilar and bibasilar interstitial prominence. No focal airspace consolidation, pleural effusion, or pneumothorax. No acute bony findings are evident. IMPRESSION: Cardiomegaly with pulmonary vascular congestion and mild interstitial edema. Electronically Signed   By: Davina Poke D.O.   On: 09/08/2022 12:14    Dialysis Orders:  [facility] MWF 4h  400/800 EDW 87.1 kg 3K/2.5Ca AVF Heparin 3000  -Mircera 200 q 2 wks (last 11/10) -Calcitriol 1.75 TIW -Sensipar 30 TIW   Assessment/Plan: Cardiac arrest. Required 2 mins CPR. Unclear etiology. Per primary  ESRD -  HD MWF. Incomplete treatment today. Plan HD tomorrow off schedule.  Hypertension/volume  - BP acceptable. Volume up -5kg over dry weight. Extra HD tomorrow to get volume down  Anemia  - Recent ESA dose as outpatient. Follow trends   Metabolic bone disease -  Continue home binders/Sensipar  Afib - new onset. On amiodarone/Eliquis  Nutrition - Renal diet with fluid restriction when eating.  Hepatitis B - dialysis in isolation room  S/p MVR   Lynnda Child PA-C [facility] 09/08/2022, 4:30 PM

## 2022-09-09 ENCOUNTER — Inpatient Hospital Stay (HOSPITAL_COMMUNITY): Payer: Medicare Other

## 2022-09-09 ENCOUNTER — Ambulatory Visit: Payer: Medicare Other | Admitting: Orthopaedic Surgery

## 2022-09-09 ENCOUNTER — Other Ambulatory Visit (HOSPITAL_COMMUNITY): Payer: Medicare Other

## 2022-09-09 DIAGNOSIS — I480 Paroxysmal atrial fibrillation: Secondary | ICD-10-CM | POA: Diagnosis not present

## 2022-09-09 DIAGNOSIS — I469 Cardiac arrest, cause unspecified: Secondary | ICD-10-CM | POA: Diagnosis not present

## 2022-09-09 DIAGNOSIS — S2243XA Multiple fractures of ribs, bilateral, initial encounter for closed fracture: Secondary | ICD-10-CM

## 2022-09-09 LAB — HEPATIC FUNCTION PANEL
ALT: 49 U/L — ABNORMAL HIGH (ref 0–44)
AST: 57 U/L — ABNORMAL HIGH (ref 15–41)
Albumin: 3.1 g/dL — ABNORMAL LOW (ref 3.5–5.0)
Alkaline Phosphatase: 85 U/L (ref 38–126)
Bilirubin, Direct: <0.1 mg/dL (ref 0.0–0.2)
Total Bilirubin: 0.2 mg/dL — ABNORMAL LOW (ref 0.3–1.2)
Total Protein: 7.1 g/dL (ref 6.5–8.1)

## 2022-09-09 LAB — CBC
HCT: 23.6 % — ABNORMAL LOW (ref 39.0–52.0)
Hemoglobin: 7.6 g/dL — ABNORMAL LOW (ref 13.0–17.0)
MCH: 31.4 pg (ref 26.0–34.0)
MCHC: 32.2 g/dL (ref 30.0–36.0)
MCV: 97.5 fL (ref 80.0–100.0)
Platelets: 323 10*3/uL (ref 150–400)
RBC: 2.42 MIL/uL — ABNORMAL LOW (ref 4.22–5.81)
RDW: 16 % — ABNORMAL HIGH (ref 11.5–15.5)
WBC: 9.4 10*3/uL (ref 4.0–10.5)
nRBC: 0 % (ref 0.0–0.2)

## 2022-09-09 LAB — BASIC METABOLIC PANEL
Anion gap: 13 (ref 5–15)
BUN: 31 mg/dL — ABNORMAL HIGH (ref 6–20)
CO2: 23 mmol/L (ref 22–32)
Calcium: 10 mg/dL (ref 8.9–10.3)
Chloride: 100 mmol/L (ref 98–111)
Creatinine, Ser: 8.74 mg/dL — ABNORMAL HIGH (ref 0.61–1.24)
GFR, Estimated: 7 mL/min — ABNORMAL LOW (ref >60)
Glucose, Bld: 131 mg/dL — ABNORMAL HIGH (ref 70–99)
Potassium: 5.1 mmol/L (ref 3.5–5.1)
Sodium: 136 mmol/L (ref 135–145)

## 2022-09-09 LAB — TROPONIN I (HIGH SENSITIVITY): Troponin I (High Sensitivity): 582 ng/L (ref <18)

## 2022-09-09 MED ORDER — PENTAFLUOROPROP-TETRAFLUOROETH EX AERO: 1.0000 | INHALATION_SPRAY | CUTANEOUS | Status: DC | PRN

## 2022-09-09 MED ORDER — HEPARIN SODIUM (PORCINE) 1000 UNIT/ML DIALYSIS
20.0000 [IU]/kg | INTRAMUSCULAR | Status: DC | PRN
  Administered 2022-09-10: 2000 [IU] via INTRAVENOUS_CENTRAL
  Filled 2022-09-09: qty 2

## 2022-09-09 MED ORDER — LIDOCAINE HCL (PF) 1 % IJ SOLN: 5.0000 mL | INTRAMUSCULAR | Status: DC | PRN

## 2022-09-09 MED ORDER — MAGNESIUM SULFATE IN D5W 1-5 GM/100ML-% IV SOLN
1.0000 g | Freq: Once | INTRAVENOUS | Status: AC
  Administered 2022-09-09: 1 g via INTRAVENOUS
  Filled 2022-09-09: qty 100

## 2022-09-09 MED ORDER — POTASSIUM CHLORIDE CRYS ER 20 MEQ PO TBCR: 40.0000 meq | EXTENDED_RELEASE_TABLET | Freq: Once | ORAL | Status: DC

## 2022-09-09 MED ORDER — LIDOCAINE-PRILOCAINE 2.5-2.5 % EX CREA: 1.0000 | TOPICAL_CREAM | CUTANEOUS | Status: DC | PRN

## 2022-09-09 MED ORDER — PERFLUTREN LIPID MICROSPHERE
1.0000 mL | INTRAVENOUS | Status: AC | PRN
  Administered 2022-09-09: 4 mL via INTRAVENOUS

## 2022-09-09 NOTE — Progress Notes (Signed)
  Echocardiogram 2D Echocardiogram has been performed.  Albert Eaton 09/09/2022, 2:02 PM

## 2022-09-09 NOTE — Progress Notes (Addendum)
Rounding Note    Patient Name: Albert Eaton Date of Encounter: 09/09/2022  [place] Cardiologist: [name], MD   Subjective   Chest wall pain.  No palpitation.  Inpatient Medications    Scheduled Meds:  amiodarone  200 mg Oral Daily   amLODipine  10 mg Oral QPC lunch   apixaban  5 mg Oral BID   bictegravir-emtricitabine-tenofovir AF  1 tablet Oral Daily   budesonide (PULMICORT) nebulizer solution  0.25 mg Nebulization BID   [START ON 09/10/2022] calcitRIOL  1.75 mcg Oral Q M,W,F-HD   cinacalcet  60 mg Oral Q M,W,F   famotidine  20 mg Oral BID   ferric citrate  210 mg Oral With snacks   ferric citrate  420 mg Oral TID WC   ipratropium  0.5 mg Nebulization Q6H   levothyroxine  25 mcg Oral Q0600   lidocaine  1 patch Transdermal Q24H   loratadine  10 mg Oral Daily   lubiprostone  24 mcg Oral BID WC   metoprolol tartrate  25 mg Oral BID   mometasone-formoterol  2 puff Inhalation BID   oxyCODONE  5 mg Oral Q6H   pantoprazole  40 mg Oral Daily   rOPINIRole  1 mg Oral QHS   rosuvastatin  10 mg Oral Daily   sodium chloride flush  3 mL Intravenous Q12H   Continuous Infusions:  sodium chloride     magnesium sulfate bolus IVPB 1 g (09/09/22 0741)   PRN Meds: sodium chloride, acetaminophen, azelastine, gabapentin, heparin, HYDROmorphone (DILAUDID) injection, levalbuterol, lidocaine (PF), lidocaine-prilocaine, metoCLOPramide (REGLAN) injection, oxyCODONE, pentafluoroprop-tetrafluoroeth, sodium chloride flush   Vital Signs    Vitals:   09/08/22 2346 09/09/22 0252 09/09/22 0352 09/09/22 0732  BP: (!) 140/88  139/89 (!) 145/94  Pulse: 84  85 78  Resp: 18  17 18   Temp: 98.8 F (37.1 C)  97.8 F (36.6 C) 97.7 F (36.5 C)  TempSrc: Oral  Oral Oral  SpO2: 100% 100% 100% 100%  Weight:   92.8 kg   Height:        Intake/Output Summary (Last 24 hours) at 09/09/2022 0823 Last data filed at 09/08/2022 2300 Gross per 24 hour  Intake 253 ml  Output --   Net 253 ml      09/09/2022    3:52 AM 09/08/2022   11:37 AM 09/02/2022    3:17 PM  Last 3 Weights  Weight (lbs) 204 lb 9.6 oz 199 lb 199 lb  Weight (kg) 92.806 kg 90.266 kg 90.266 kg      Telemetry    SR - Personally Reviewed  ECG    N/A  Physical Exam   GEN: No acute distress.   Neck: No JVD Cardiac: RRR, no murmurs, rubs, or gallops. TTP at chest  Respiratory: Clear to auscultation bilaterally. GI: Soft, nontender, non-distended  MS: No edema; No deformity. Neuro:  Nonfocal  Psych: Normal affect   Labs    High Sensitivity Troponin:   Recent Labs  Lab 08/29/22 1505 08/29/22 1645 09/08/22 1138 09/08/22 1455 09/09/22 0502  TROPONINIHS 160* 148* 54* 606* 582*     Chemistry Recent Labs  Lab 09/08/22 1138 09/08/22 1147 09/09/22 0147  NA 141 139 136  K 3.5 3.5 5.1  CL 102 104 100  CO2 24  --  23  GLUCOSE 158* 154* 131*  BUN 19 21* 31*  CREATININE 6.67* 7.50* 8.74*  CALCIUM 10.0  --  10.0  MG 1.7  --   --  PROT 6.6  --  7.1  ALBUMIN 3.0*  --  3.1*  AST 80*  --  57*  ALT 57*  --  49*  ALKPHOS 88  --  85  BILITOT 0.2*  --  0.2*  GFRNONAA 9*  --  7*  ANIONGAP 15  --  13    Lipids No results for input(s): "CHOL", "TRIG", "HDL", "LABVLDL", "LDLCALC", "CHOLHDL" in the last 168 hours.  Hematology Recent Labs  Lab 09/08/22 1138 09/08/22 1147 09/08/22 1627 09/09/22 0147  WBC 9.3  --   --  9.4  RBC 2.42*  --   --  2.42*  HGB 7.7* 8.2* 8.0* 7.6*  HCT 24.2* 24.0* 24.6* 23.6*  MCV 100.0  --   --  97.5  MCH 31.8  --   --  31.4  MCHC 31.8  --   --  32.2  RDW 15.7*  --   --  16.0*  PLT 269  --   --  323   Thyroid  Recent Labs  Lab 09/08/22 1205  TSH 2.868    BNP Recent Labs  Lab 09/08/22 1205  BNP 2,787.4*    DDimer No results for input(s): "DDIMER" in the last 168 hours.   Radiology    CT CHEST ABDOMEN PELVIS W CONTRAST  Result Date: 09/08/2022 CLINICAL DATA:  Post code, chest pain, shortness of breath EXAM: CT CHEST, ABDOMEN, AND  PELVIS WITH CONTRAST TECHNIQUE: Multidetector CT imaging of the chest, abdomen and pelvis was performed following the standard protocol during bolus administration of intravenous contrast. RADIATION DOSE REDUCTION: This exam was performed according to the departmental dose-optimization program which includes automated exposure control, adjustment of the mA and/or kV according to patient size and/or use of iterative reconstruction technique. CONTRAST:  81m OMNIPAQUE IOHEXOL 350 MG/ML SOLN COMPARISON:  CT chest done on 08/29/2022 the FINDINGS: CT CHEST FINDINGS Cardiovascular: Extensive coronary artery calcifications are seen. There is metallic density in mitral annulus suggesting prosthetic cardiac valve. Heart is enlarged in size. Small pericardial effusion is present. There is homogeneous enhancement in thoracic aorta. There are no intraluminal filling defects in central pulmonary artery branches. Mediastinum/Nodes: There is no mediastinal hematoma. Lungs/Pleura: There is interval decrease in bilateral pleural effusions. Small patchy infiltrates are seen in both mid and both lower lung fields, more so on the right side. Small right pleural effusion is seen. Few blebs are seen in the left apex. There is no demonstrable pneumothorax. Musculoskeletal: Slightly displaced fractures are seen in the anterior aspects of left third, fourth, fifth and sixth ribs. There are undisplaced fractures in the anterior right third, fourth, fifth and sixth ribs CT ABDOMEN PELVIS FINDINGS Hepatobiliary: No focal abnormalities are seen. There is no dilation of bile ducts. Surgical clips are seen in gallbladder fossa. Pancreas: No focal abnormalities are seen. Spleen: Unremarkable. Adrenals/Urinary Tract: Left kidney is not seen suggesting left nephrectomy. Adrenals are unremarkable. There is no hydronephrosis in the right kidney. There is 2.1 cm low-density structure in the parapelvic region in right kidney suggesting renal cyst. No  follow-up is recommended. There is no right renal or right ureteral stone. Urinary bladder is not distended. Stomach/Bowel: Stomach is unremarkable. Small bowel loops are not dilated. Appendix is not dilated. There is no significant focal wall thickening in colon. There is mild diffuse wall thickening in sigmoid colon and rectum. There is no pericolic stranding. Vascular/Lymphatic: Arterial calcifications are seen. Reproductive: Prostate is not enlarged Other: There is no ascites or pneumoperitoneum. Bilateral inguinal hernias containing  fat are seen. Musculoskeletal: Schmorl's nodes are seen in multiple lumbar and thoracic vertebral bodies. Multiple bilateral rib fractures as described earlier. IMPRESSION: There are recent fractures in the anterior aspect of left and right third, fourth, fifth and sixth ribs. There is no pneumothorax. Major vascular structures in the mediastinum appear intact. Small right pleural effusion. There are patchy infiltrates in mid and lower lung fields suggesting atelectasis/pneumonia. Cardiomegaly.  Coronary artery disease.  Small pericardial effusion. There is no laceration in solid organs. Fatty liver. Right renal cysts. There is mild diffuse wall thickening in rectosigmoid patient may be due to incomplete distention or suggest nonspecific mild colitis. There is no evidence of intestinal obstruction or pneumoperitoneum. There is no hydronephrosis in the right kidney. There is previous left nephrectomy. Other findings as described in the body of the report. Electronically Signed   By: Elmer Picker M.D.   On: 09/08/2022 13:42   CT Head Wo Contrast  Result Date: 09/08/2022 CLINICAL DATA:  Anoxic brain damage cpr EXAM: CT HEAD WITHOUT CONTRAST TECHNIQUE: Contiguous axial images were obtained from the base of the skull through the vertex without intravenous contrast. RADIATION DOSE REDUCTION: This exam was performed according to the departmental dose-optimization program which  includes automated exposure control, adjustment of the mA and/or kV according to patient size and/or use of iterative reconstruction technique. COMPARISON:  08/22/2022 FINDINGS: Brain: Mild atrophy. No evidence of acute infarction, hemorrhage, hydrocephalus, extra-axial collection or mass lesion/mass effect. Vascular: Atherosclerotic and physiologic intracranial calcifications. Skull: Normal. Negative for fracture or focal lesion. Sinuses/Orbits: No acute finding. Other: None IMPRESSION: 1. No acute intracranial findings. 2. Mild atrophy. Electronically Signed   By: Lucrezia Europe M.D.   On: 09/08/2022 13:08   DG Chest Portable 1 View  Result Date: 09/08/2022 CLINICAL DATA:  CPR EXAM: PORTABLE CHEST 1 VIEW COMPARISON:  08/21/2022, 09/02/2022 FINDINGS: Patient is slightly rotated. Stable cardiomegaly. Prosthetic cardiac valve. Pulmonary vascular congestion. Perihilar and bibasilar interstitial prominence. No focal airspace consolidation, pleural effusion, or pneumothorax. No acute bony findings are evident. IMPRESSION: Cardiomegaly with pulmonary vascular congestion and mild interstitial edema. Electronically Signed   By: Davina Poke D.O.   On: 09/08/2022 12:14    Cardiac Studies   Pending echo   Patient Profile     56 y.o. male with history of nonobstructive CAD, renal cell carcinoma s/p left nephrectomy 01/2021, end-stage renal disease on hemodialysis (MWF), hypertension, hyperlipidemia, HFpEF, HIV, atrial tachycardia who recently underwent mitral valve repair 07/2022 >> hospital complicated hospital course by myoclonus, postop atrial fibrillation (treated with amiodarone ) and respiratory arrest requiring reintubation and CVA.  Readmitted 11/3-11/5 for pleuritic chest pain in setting of a flutter RVR.  Placed on Eliquis for anticoagulation.  Now presenting from dialysis sessions for cardiac arrest. He received 2 minutes of CPR with 2 doses of epinephrine and 1 g of calcium given with return of  spontaneous circulation.  The monitor showed SVT versus atrial fibrillation.  His chest x-ray here showed mild edema.   Assessment & Plan    Cardiac arrest  Elevated troponin - HS-troponin 54>>606>>582.  Likely demand ischemia in setting of severe and end-stage renal disease.  Currently reporting chest pain.  EKG acute ischemic changes.  Pending echocardiogram. -Recent cardiac catheterization August 01, 2022 with moderate nonobstructive CAD.  Mitral valve disorder s/p repair 08/21/22 -Pending echocardiogram today  Paroxysmal atrial fibrillation/flutter -During cardiac arrest/postarrest monitor with SVT/atrial fibrillation.  No history of available for review. -Continue amiodarone 200 mg daily -Continue Eliquis 5  mg twice daily -Continue metoprolol titrate 25 mg.  HFpEF -Managed by dialysis  Chest wall pain after CPR On Oxycodone  For questions or updates, please contact [place] Please consult [email or web address] for contact info under   SignedLeanor Kail, PA  09/09/2022, 8:23 AM     Patient seen, examined. Available data reviewed. Agree with findings, assessment, and plan as outlined by Robbie Lis, PA.  The patient is independently interviewed and examined.  He is alert, oriented, in no distress.  HEENT is normal, JVP is normal, lungs are clear bilaterally, heart is regular rate rhythm no murmur gallop, abdomen soft nontender, extremities have no edema.  The patient has chest wall/sternal tenderness to any touch.  Data is reviewed.  CT chest abdomen pelvis shows recent fractures along the anterior aspect of the left and right third, fourth, fifth, and sixth ribs without pneumothorax.  Major vascular structures are intact.  There is a small right pleural effusion and patchy infiltrates in the lower lung fields.  There is a small pericardial effusion and no other significant findings.  The patient had a recent CT angiogram on August 29, 2022 showing no pulmonary  embolus.A postoperative echocardiogram showed an LVEF of 45 to 50% with mild global hypokinesis, interventricular septal flattening consistent with RV pressure and volume overload, mildly reduced RV function, normal function of the mitral valve repair site, and severe tricuspid regurgitation.  Etiology of the patient's cardiac arrest is currently unclear.  It does not appear that he had any ventricular arrhythmia based on review of the records.  Will repeat a limited 2D echocardiogram to evaluate for any changes in LVEF, regional wall motion abnormalities, or right heart function.  No significant arrhythmia identified on telemetry monitoring to date.  While the patient did not have high-grade obstructive CAD on preoperative catheterization, there is possibility of compressing or kinking the circumflex with mitral valve repair.  I discussed the patient's case with Dr. Lavonna Monarch and he agrees it would be reasonable to perform cardiac catheterization to evaluate for any change in circumflex anatomy as a potential etiology of ischemia/etiology of cardiac arrest.  Will plan to add the patient on for cath tomorrow. I have reviewed the risks, indications, and alternatives to cardiac catheterization, possible angioplasty, and stenting with the patient. Risks include but are not limited to bleeding, infection, vascular injury, stroke, myocardial infection, arrhythmia, kidney injury, radiation-related injury in the case of prolonged fluoroscopy use, emergency cardiac surgery, and death. The patient understands the risks of serious complication is 1-2 in 9390 with diagnostic cardiac cath and 1-2% or less with angioplasty/stenting.  Discussed findings and treatment plan with the patient's nephew over the telephone.  [name], M.D. 09/09/2022 10:44 AM

## 2022-09-09 NOTE — Progress Notes (Signed)
[facility] PROGRESS NOTE  Cline Draheim Subramanian (DOB: [birth year]) JJH:[id number] PCP: [name], NP  Brief Narrative: Albert Eaton is a 56 y.o. male with medical history significant of ESRD on HD MWF, chronic HFpEF, severe MR status post mitral valve repair October 2023, PAF on amiodarone and eliquis, OSA not tolerating CPAP, hypothyroidism, HTN, HLD who presented to the ED 11/13 after cardiac arrest during dialysis. ROSC achieved   Patient does not recall any of the event during [facility] and HD center.  He reported he had a normal day yesterday and felt normal this morning before going to the HD center.  Half way into the HD process after removing about 2 L of fluid, patient became unresponsive with a GCS= 3, HD staff could not feel his radial pulses/carotid pulses and [facility] was called.  Monitor showed however patient had SVT versus A-fib.  CPR started right away and 2 rounds of epinephrine given and 1 dose of calcium gluconate pushed, then patient recovered pulses and consciousness.  During the episode, patient was also found to have agonal breathing 4-5 times a minute.  No seizure-like activity reported, no loss control of urine or bowel movement or tongue biting during the episode.  Patient reported that he has mild asthma exacerbation and has been using albuterol nebulizer daily, and confirmed is been taking amiodarone for A-fib.  ED Course: Patient was found normotensive and none tachycardia not hypoxic.  CT angiogram showed multiple fractured ribs and small bilateral pleural effusion otherwise no significant abnormalities.  EKG showed normal sinus rhythm with borderline prolonged QTc.  Troponin trending 50> 600.  Lactic acid 5.0> 2.4.  Patient was evaluated by PCCM, recommended admission to progressive care under hospitalist service.   Plan is pain control for rib fractures and cardiac catheterization to evaluate etiology of cardiac arrest.  Subjective: Pain in ribs is only  complaint.  Objective: BP (!) 140/96 (BP Location: Right Arm)   Pulse 76   Temp 98.6 F (37 C) (Oral)   Resp 20   Ht 5' 10"  (1.778 m)   Wt 92.8 kg   SpO2 100%   BMI 29.36 kg/m   Gen: No distress Pulm: Clear, nonlabored  CV: RRR, no MRG, no edema GI: Soft, NT, ND, +BS  Neuro: Alert and oriented. No new focal deficits. Ext: Warm, no deformities Skin: Exquisite tenderness to anterior chest palpation without overlying rash. No other rashes, lesions or ulcers on visualized skin   Assessment & Plan: Cardiac arrest: Unclear rhythm, though no fibrillation noted, ?PEA.  - Discussed with cardiology who has conferred with CT surgery. To r/o LCx compression with MVR, will pursue cardiac catheterization.   Acute metabolic encephalopathy: -Initial GCS=3, secondary to cardiopulmonary arrest, mentation fully recovered to baseline after CPR.   Elevated troponins -Likely from CPR, trend troponins, repeat EKG. Cath as above   Acute asthmatic exacerbation: Given steroids on admission. There is no wheezing currently. Steroids were not reordered at admission and will not be reordered at this time.  - Continue bronchodilators   Bilateral multiple ribs fracture: Due to CPR.  - Pain control, incentive spirometry. At high risk for atelectasis from splinting.    PAF - Continue amiodarone, metoprolol, eliquis.  Acute on chronic normocytic anemia -Decrease of hemoglobin level, no significant bleeding on chest CT today, recheck H&H tonight   ESRD on HD -Appears to be mild overloaded, consult nephrology   Acute on chronic HFpEF - Mildly overloaded, nephrology consulted will dialyze   HIV -  Continue Biktarvy   OSA - Not tolerating CPAP due to severe claustrophobia.     Patrecia Pour, MD [facility] [email or web address] 09/09/2022, 2:27 PM

## 2022-09-09 NOTE — Progress Notes (Signed)
Magnesium 1.7 on admission, no replacement noted in chart; Hgb this AM 7.6; on call [facility] provider notified. 1g IV mag ordered

## 2022-09-09 NOTE — Progress Notes (Signed)
[facility] Progress Note   Subjective: Seen in room. He's alert, endorses chest wall pain,soreness. Some SOB. Still doesn't remember much from yesterday.   Objective Vitals:   09/09/22 0252 09/09/22 0352 09/09/22 0732 09/09/22 0828  BP:  139/89 (!) 145/94   Pulse:  85 78   Resp:  17 18   Temp:  97.8 F (36.6 C) 97.7 F (36.5 C)   TempSrc:  Oral Oral   SpO2: 100% 100% 100% 100%  Weight:  92.8 kg    Height:          Additional Objective Labs: Basic Metabolic Panel: Recent Labs  Lab 09/08/22 1138 09/08/22 1147 09/09/22 0147  NA 141 139 136  K 3.5 3.5 5.1  CL 102 104 100  CO2 24  --  23  GLUCOSE 158* 154* 131*  BUN 19 21* 31*  CREATININE 6.67* 7.50* 8.74*  CALCIUM 10.0  --  10.0   CBC: Recent Labs  Lab 09/08/22 1138 09/08/22 1147 09/08/22 1627 09/09/22 0147  WBC 9.3  --   --  9.4  NEUTROABS 5.2  --   --   --   HGB 7.7* 8.2* 8.0* 7.6*  HCT 24.2* 24.0* 24.6* 23.6*  MCV 100.0  --   --  97.5  PLT 269  --   --  323   Blood Culture    Component Value Date/Time   SDES  08/29/2022 1635    BLOOD RIGHT FOREARM Performed at [facility], [place] [address] [place], [place] [address]    [facility]  08/29/2022 1635    Blood Culture adequate volume BOTTLES DRAWN AEROBIC AND ANAEROBIC Performed at [facility], [address] [place], [place] [address]    CULT  08/29/2022 1635    NO GROWTH 5 DAYS Performed at [facility] [address] [place], [place] [address]    REPTSTATUS 09/03/2022 FINAL 08/29/2022 1635     Physical Exam General: Alert, nad on nasal oxygen Heart: RRR No m,r,g  Lungs: Clear bilaterally  Abdomen: soft non-tender Extremities: No sig LE edema Dialysis Access: L AVF +bruit   Medications:  sodium chloride      amiodarone  200 mg Oral Daily   amLODipine  10 mg Oral QPC lunch   apixaban  5 mg Oral BID   bictegravir-emtricitabine-tenofovir AF  1 tablet Oral Daily   budesonide  (PULMICORT) nebulizer solution  0.25 mg Nebulization BID   [START ON 09/10/2022] calcitRIOL  1.75 mcg Oral Q M,W,F-HD   cinacalcet  60 mg Oral Q M,W,F   famotidine  20 mg Oral BID   ferric citrate  210 mg Oral With snacks   ferric citrate  420 mg Oral TID WC   ipratropium  0.5 mg Nebulization Q6H   levothyroxine  25 mcg Oral Q0600   lidocaine  1 patch Transdermal Q24H   loratadine  10 mg Oral Daily   lubiprostone  24 mcg Oral BID WC   metoprolol tartrate  25 mg Oral BID   mometasone-formoterol  2 puff Inhalation BID   oxyCODONE  5 mg Oral Q6H   pantoprazole  40 mg Oral Daily   rOPINIRole  1 mg Oral QHS   rosuvastatin  10 mg Oral Daily   sodium chloride flush  3 mL Intravenous Q12H    Dialysis Orders:  [place] MWF 4h  400/800 EDW 87.1 kg 3K/2.5Ca AVF Heparin 3000  -Mircera 200 q 2 wks (last 11/10) -Calcitriol 1.75 TIW -Sensipar 30 TIW  Assessment/Plan: Cardiac arrest during dialysis. Required 2 mins CPR. Unclear etiology. Per cardiology/primary  ESRD -  HD MWF. Incomplete treatment Monday. Next HD 11/15.  Hypertension/volume  - BP acceptable. Volume up -5kg over dry weight. UF to EDW as able.  Anemia  - Recent ESA dose as outpatient. Follow trends  Metabolic bone disease -  Continue home binders/Sensipar  Afib - new onset. On amiodarone/Eliquis  Nutrition - Renal diet with fluid restriction  Hepatitis B - dialysis in isolation room  S/p MVR    Lynnda Child PA-C [facility] 09/09/2022,10:29 AM

## 2022-09-10 DIAGNOSIS — I469 Cardiac arrest, cause unspecified: Secondary | ICD-10-CM | POA: Diagnosis not present

## 2022-09-10 LAB — CBC
HCT: 21.9 % — ABNORMAL LOW (ref 39.0–52.0)
Hemoglobin: 7.2 g/dL — ABNORMAL LOW (ref 13.0–17.0)
MCH: 32.3 pg (ref 26.0–34.0)
MCHC: 32.9 g/dL (ref 30.0–36.0)
MCV: 98.2 fL (ref 80.0–100.0)
Platelets: 291 10*3/uL (ref 150–400)
RBC: 2.23 MIL/uL — ABNORMAL LOW (ref 4.22–5.81)
RDW: 16.6 % — ABNORMAL HIGH (ref 11.5–15.5)
WBC: 11.1 10*3/uL — ABNORMAL HIGH (ref 4.0–10.5)
nRBC: 0.2 % (ref 0.0–0.2)

## 2022-09-10 LAB — BASIC METABOLIC PANEL
Anion gap: 16 — ABNORMAL HIGH (ref 5–15)
BUN: 56 mg/dL — ABNORMAL HIGH (ref 6–20)
CO2: 24 mmol/L (ref 22–32)
Calcium: 9 mg/dL (ref 8.9–10.3)
Chloride: 93 mmol/L — ABNORMAL LOW (ref 98–111)
Creatinine, Ser: 11.09 mg/dL — ABNORMAL HIGH (ref 0.61–1.24)
GFR, Estimated: 5 mL/min — ABNORMAL LOW (ref >60)
Glucose, Bld: 114 mg/dL — ABNORMAL HIGH (ref 70–99)
Potassium: 5.7 mmol/L — ABNORMAL HIGH (ref 3.5–5.1)
Sodium: 133 mmol/L — ABNORMAL LOW (ref 135–145)

## 2022-09-10 LAB — HEPATITIS B SURFACE ANTIBODY, QUANTITATIVE: Hep B S AB Quant (Post): <3.1 m[IU]/mL — ABNORMAL LOW (ref >9.9)

## 2022-09-10 LAB — HEMOGLOBIN AND HEMATOCRIT, BLOOD
HCT: 23.1 % — ABNORMAL LOW (ref 39.0–52.0)
Hemoglobin: 7.5 g/dL — ABNORMAL LOW (ref 13.0–17.0)

## 2022-09-10 LAB — TYPE AND SCREEN
ABO/RH(D): B POS
Antibody Screen: NEGATIVE

## 2022-09-10 LAB — ECHOCARDIOGRAM LIMITED
Area-P 1/2: 2.36 cm2
Height: 70 in
MV M vel: 5 m/s
MV Peak grad: 99.8 mmHg
S' Lateral: 4.5 cm
Weight: 3273.6 oz

## 2022-09-10 LAB — HEPATITIS B SURFACE ANTIGEN: Hepatitis B Surface Ag: REACTIVE — AB

## 2022-09-10 LAB — APTT: aPTT: 37 seconds — ABNORMAL HIGH (ref 24–36)

## 2022-09-10 MED ORDER — FAMOTIDINE 20 MG PO TABS: 20.0000 mg | ORAL_TABLET | Freq: Every day | ORAL | Status: DC

## 2022-09-10 MED ORDER — FAMOTIDINE 20 MG PO TABS
20.0000 mg | ORAL_TABLET | Freq: Every day | ORAL | Status: DC
  Administered 2022-09-10 – 2022-09-14 (×5): 20 mg via ORAL
  Filled 2022-09-10 (×5): qty 1

## 2022-09-10 MED ORDER — HEPARIN SODIUM (PORCINE) 1000 UNIT/ML IJ SOLN
INTRAMUSCULAR | Status: AC
  Filled 2022-09-10: qty 4

## 2022-09-10 MED ORDER — HEPARIN (PORCINE) 25000 UT/250ML-% IV SOLN
1700.0000 [IU]/h | INTRAVENOUS | Status: DC
  Administered 2022-09-10: 1400 [IU]/h via INTRAVENOUS
  Administered 2022-09-11: 1700 [IU]/h via INTRAVENOUS
  Filled 2022-09-10 (×3): qty 250

## 2022-09-10 NOTE — TOC Initial Note (Signed)
Transition of Care [facility]) - Initial/Assessment Note    Patient Details  Name: Albert Eaton MRN: [id number] Date of Birth: 1966-05-02  Transition of [facility]) CM/SW Contact:    Bethena Roys, RN Phone Number: 09/10/2022, 2:53 PM  Clinical Narrative: Case Manager spoke with patient regarding disposition needs. PTA patient was from home alone in an apartment. Patient has family support of nephew and nieces. Patient has transportation to HD via nephew. Case Manager will continue to follow for transition of care needs as the patient progresses.      Expected Discharge Plan: Home/Self Care Barriers to Discharge: Continued Medical Work up   Patient Goals and CMS Choice Patient states their goals for this hospitalization and ongoing recovery are:: to return home.   Choice offered to / list presented to : NA  Expected Discharge Plan and Services Expected Discharge Plan: Home/Self Care   Discharge Planning Services: CM Consult Post Acute Care Choice: NA Living arrangements for the past 2 months: Apartment                   DME Agency: NA    Prior Living Arrangements/Services Living arrangements for the past 2 months: Apartment Lives with:: Self Patient language and need for interpreter reviewed:: Yes Do you feel safe going back to the place where you live?: Yes      Need for Family Participation in Patient Care: No (Comment) Care giver support system in place?: No (comment)   Criminal Activity/Legal Involvement Pertinent to Current Situation/Hospitalization: No - Comment as needed  Permission Sought/Granted Permission sought to share information with : Case Manager   Emotional Assessment Appearance:: Appears stated age Attitude/Demeanor/Rapport: Engaged Affect (typically observed): Appropriate Orientation: : Oriented to Self, Oriented to Place, Oriented to  Time, Oriented to Situation Alcohol / Substance Use: Not Applicable Psych Involvement: No  (comment)  Admission diagnosis:  Cardiac arrest ([place]) [I46.9] ESRD (end stage renal disease) on dialysis ([place]) [N18.6, Z99.2] Elevated brain natriuretic peptide (BNP) level [R79.89] Retrograde amnesia [R41.2] Closed fracture of multiple ribs of both sides, initial encounter [S22.43XA] Patient Active Problem List   Diagnosis Date Noted   Cardiac arrest ([place]) 09/08/2022   Acute cerebrovascular insufficiency 08/28/2022   Cerebrovascular accident (CVA) due to embolism of left cerebellar artery ([place])    S/P MVR (mitral valve repair) 08/21/2022   Chronic pain of right ankle 06/26/2022   Autonomic neuropathy in diseases classified elsewhere 04/21/2022   PAF (paroxysmal atrial fibrillation) ([place]) 12/02/2021   Laryngopharyngeal reflux (LPR) 11/11/2021   Chest pain of uncertain etiology    Elevated troponin    NSTEMI (non-ST elevated myocardial infarction) ([place]) 09/03/2021   Essential hypertension 09/03/2021   Renal cell carcinoma ([place]) 06/27/2021   Generalized (acute) peritonitis ([place]) 06/24/2021   Other disorders of bilirubin metabolism 06/24/2021   Renal mass 02/18/2021   Pain, unspecified 02/08/2021   SVT (supraventricular tachycardia) 01/18/2021   Lower abdominal pain 06/26/2020   Contact with and (suspected) exposure to tuberculosis 04/02/2020   Fluid overload, unspecified 04/02/2020   Pulsatile tinnitus of right ear 11/21/2019   Kidney failure 76/81/1572   Acute diastolic CHF (congestive heart failure) ([place]) 11/21/2019   Chills (without fever) 11/07/2019   Hypercalcemia 10/17/2019   Dependence on renal dialysis ([place]) 06/06/2019   Coagulation defect, unspecified ([place]) 05/23/2019   Iron deficiency anemia, unspecified 05/16/2019   Anemia in other chronic diseases classified elsewhere 05/13/2019   Arteriovenous fistula, acquired ([place]) 05/13/2019   Body mass index (BMI) 28.0-28.9, adult 05/13/2019  Crohn's disease of small intestine without complications ([place]) 73/71/0626    Encounter for screening for respiratory tuberculosis 05/13/2019   Gout, unspecified 05/13/2019   Nausea 05/13/2019   Other fatigue 05/13/2019   Secondary hyperparathyroidism of renal origin ([place]) 05/13/2019   ESRD (end stage renal disease) on dialysis ([place]) 05/13/2019   Chronic diastolic (congestive) heart failure ([place]) 05/13/2019   Chronic kidney disease, unspecified 05/13/2019   Obstructive sleep apnea 05/13/2019   Discomfort of right ear 02/18/2019   Hypokalemia 06/13/2018   Abnormal CT scan, small bowel    Perennial allergic rhinitis with a predominant nonallergic component 02/09/2018   Allergic conjunctivitis 02/09/2018   Mild intermittent asthma 02/09/2018   Atherosclerosis 12/09/2017   Hypothyroidism 11/19/2017   Mixed hyperlipidemia 10/13/2017   Personal history of gout 10/13/2017   S/P cholecystectomy 10/13/2017   Congestive heart failure with LV diastolic dysfunction, NYHA class 1 ([place]) 10/13/2017   Hypothyroidism (acquired) 10/13/2017   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[place]) 07/31/2017   SOB (shortness of breath) 06/07/2017   Arthritis, gouty 01/27/2017   Chronic diastolic CHF (congestive heart failure) ([place]) 01/06/2017   OSA (obstructive sleep apnea) 01/06/2017   Abnormal electrocardiogram (ECG) (EKG) 10/31/2016   Pleuritic chest pain 10/09/2016   HIV disease ([place]) 10/09/2016   Hypertensive heart disease with CHF (congestive heart failure) ([place]) 10/09/2016   Left-sided low back pain without sciatica 05/29/2015   NYHA class 2 heart failure with preserved ejection fraction ([place]) 05/15/2015   PAH (pulmonary artery hypertension) ([place]) 05/08/2015   Severe mitral regurgitation 05/04/2015   Prolonged Q-T interval on ECG 05/04/2015   PCP:  [name], NP Pharmacy:   [facility], [place] [address] [place] [address] Phone: [phone] Fax: [phone]  [facility] [place], [place] - [address] [place], [address] [place] [address] Phone: [phone] Fax: [phone]   Readmission Risk Interventions    09/10/2022    2:51 PM 08/22/2022   11:54 AM 09/05/2021   11:20 AM  Readmission Risk Prevention Plan  Transportation Screening Complete Complete Complete  Medication Review (RN Care Manager) Complete Complete Complete  PCP or Specialist appointment within 3-5 days of discharge   Complete  HRI or Home Care Consult Complete Complete Complete  SW Recovery Care/Counseling Consult Complete Complete Complete  Palliative Care Screening Not Applicable Not Applicable Not Applicable  Skilled Nursing Facility Complete Not Applicable Not Applicable

## 2022-09-10 NOTE — Progress Notes (Signed)
ANTICOAGULATION CONSULT NOTE  Pharmacy Consult for heparin Indication: atrial fibrillation  Allergies  Allergen Reactions   Ace Inhibitors Cough and Other (See Comments)    Patient Measurements: Height: 5' 10"  (177.8 cm) Weight: 94.3 kg (208 lb) IBW/kg (Calculated) : 73 Heparin Dosing Weight: 94 kg  Vital Signs: Temp: 98 F (36.7 C) (11/15 0800) Temp Source: Oral (11/15 0800) BP: 122/81 (11/15 0900) Pulse Rate: 72 (11/15 0900)  Labs: Recent Labs    09/08/22 1138 09/08/22 1147 09/08/22 1455 09/08/22 1627 09/09/22 0147 09/09/22 0502 09/10/22 0242  HGB 7.7* 8.2*  --  8.0* 7.6*  --  7.2*  HCT 24.2* 24.0*  --  24.6* 23.6*  --  21.9*  PLT 269  --   --   --  323  --  291  LABPROT 16.1*  --   --   --   --   --   --   INR 1.3*  --   --   --   --   --   --   CREATININE 6.67* 7.50*  --   --  8.74*  --  11.09*  TROPONINIHS 54*  --  606*  --   --  582*  --     Estimated Creatinine Clearance: 8.6 mL/min (A) (by C-G formula based on SCr of 11.09 mg/dL (H)).   Medical History: Past Medical History:  Diagnosis Date   Anemia    Aortic atherosclerosis (HCC)    Arthritis    Asthma    Cancer ([place])    renal cyst   Chronic diastolic CHF (congestive heart failure) ([place]) 01/06/2017   Echo 7/16 [facility] in [place], [name]) Mild AI, mild LAE, mild concentric LVH, EF 55, normal wall motion, mild to moderate MR, mild PI, RVSP 55 // Echo 10/09/16 ([facility]):  Moderate LVH, grade 2 diastolic dysfunction, mild MR, moderate LAE    Esophagitis    ESRD (end stage renal disease) on dialysis [facility])    Dialysis Mon Wed Fri   GERD (gastroesophageal reflux disease)    Gout    no current problems   Hepatitis    Hep B   HIV (human immunodeficiency virus infection) ([place])    HLD (hyperlipidemia)    Hypertension    Hypothyroidism    Mitral regurgitation    Neuropathy    Sleep apnea    uses c-pap   Wears glasses    Wears partial dentures      Assessment: 30 yoM admitted with brief  cardiac arrest in HD. Pt on apixaban PTA for hx AFib. Cath planned, will hold further apixaban and start heparin infusion. Last dose of apixaban was 11/14 2136.  Goal of Therapy:  Heparin level 0.3-0.7 units/ml aPTT 66-102 seconds Monitor platelets by anticoagulation protocol: Yes   Plan:  Hold apixaban Heparin 1400 units/h no bolus Check aPTT in 8h  Arrie Senate, PharmD, [place], [facility] Clinical Pharmacist [phone] Please check AMION for all [place] numbers 09/10/2022

## 2022-09-10 NOTE — Progress Notes (Signed)
PROGRESS NOTE    Albert Eaton  FAO:[id number] DOB: [birth year] DOA: 09/08/2022 PCP: [name], NP  Chief Complaint  Patient presents with   Cardiac Arrest    Brief Narrative:  Albert Eaton is Albert Eaton 56 y.o. male with medical history significant of ESRD on HD MWF, chronic HFpEF, severe MR status post mitral valve repair October 2023, PAF on amiodarone and eliquis, OSA not tolerating CPAP, hypothyroidism, HTN, HLD who presented to the ED 11/13 after cardiac arrest during dialysis. ROSC achieved   Patient does not recall any of the event during [facility] and HD center.  He reported he had Albert Eaton normal day yesterday and felt normal this morning before going to the HD center.  Half way into the HD process after removing about 2 L of fluid, patient became unresponsive with Albert Eaton GCS= 3, HD staff could not feel his radial pulses/carotid pulses and [facility] was called.  Monitor showed however patient had SVT versus Albert Eaton-fib.  CPR started right away and 2 rounds of epinephrine given and 1 dose of calcium gluconate pushed, then patient recovered pulses and consciousness.  During the episode, patient was also found to have agonal breathing 4-5 times Albert Eaton minute.  No seizure-like activity reported, no loss control of urine or bowel movement or tongue biting during the episode.  Patient reported that he has mild asthma exacerbation and has been using albuterol nebulizer daily, and confirmed is been taking amiodarone for Albert Eaton-fib.   ED Course: Patient was found normotensive and none tachycardia not hypoxic.  CT angiogram showed multiple fractured ribs and small bilateral pleural effusion otherwise no significant abnormalities.  EKG showed normal sinus rhythm with borderline prolonged QTc.  Troponin trending 50> 600.  Lactic acid 5.0> 2.4.  Patient was evaluated by PCCM, recommended admission to progressive care under hospitalist service.    Plan is pain control for rib fractures and cardiac catheterization to evaluate  etiology of cardiac arrest.   Assessment & Plan:   Principal Problem:   Cardiac arrest [facility])   Assessment and Plan: Cardiac arrest: Unclear rhythm, though no fibrillation noted, ?PEA.  - Discussed with cardiology who has conferred with CT surgery. To r/o LCx compression with MVR, will pursue cardiac catheterization.  Considering cardiac MRI if angiography reassuring.    Acute metabolic encephalopathy: -Initial GCS=3, secondary to cardiopulmonary arrest, mentation fully recovered to baseline after CPR.   Elevated troponins -Likely from arrest/CPR, trend troponins, repeat EKG. Cath as above   Acute asthmatic exacerbation: Given steroids on admission. There is no wheezing currently. Steroids were not reordered at admission and will not be reordered at this time.  - Continue bronchodilators   Bilateral multiple ribs fracture: Due to CPR.  - Pain control, incentive spirometry. At high risk for atelectasis from splinting.  - suspect his CP is related to msk pain    PAF - Continue amiodarone, metoprolol, eliquis.   Acute on chronic normocytic anemia -Decrease of hemoglobin level - repeat H&H, type and screen   ESRD on HD  hyperkalemia -dialysis per renal   Acute on chronic HFpEF - volume per renal    HIV - Continue Biktarvy   OSA - Not tolerating CPAP due to severe claustrophobia.     DVT prophylaxis: heparin gtt Code Status: full Family Communication: none Disposition:   Status is: Inpatient Remains inpatient appropriate because: pending LHC   Consultants:  Cardiology renal  Procedures:  Pending LHC  Antimicrobials:  Anti-infectives (From admission, onward)    Start  Dose/Rate Route Frequency Ordered Stop   09/08/22 1757  bictegravir-emtricitabine-tenofovir AF (BIKTARVY) 50-200-25 MG per tablet 1 tablet        1 tablet Oral Daily 09/08/22 1554         Subjective: No new complaints, continued chest pain  Objective: Vitals:   09/10/22 1200  09/10/22 1225 09/10/22 1341 09/10/22 1416  BP: 117/84 126/88 127/82 130/88  Pulse: 74 79 76 77  Resp: 12 (!) 25 18   Temp:  98.7 F (37.1 C) 98.6 F (37 C)   TempSrc:  Axillary Oral   SpO2: 100% 100% 100%   Weight:  91.7 kg    Height:        Intake/Output Summary (Last 24 hours) at 09/10/2022 1442 Last data filed at 09/10/2022 1225 Gross per 24 hour  Intake 3 ml  Output 4 ml  Net -1 ml   Filed Weights   09/09/22 0352 09/10/22 0428 09/10/22 1225  Weight: 92.8 kg 94.3 kg 91.7 kg    Examination:  General exam: Appears calm and comfortable  Respiratory system: unlabored Cardiovascular system: TTP of chest, RRR Gastrointestinal system: Abdomen is nondistended, soft and nontender.  Central nervous system: Alert and oriented. No focal neurological deficits. Extremities:no LEE Psychiatry: Judgement and insight appear normal. Mood & affect appropriate.   Data Reviewed: I have personally reviewed following labs and imaging studies  CBC: Recent Labs  Lab 09/08/22 1138 09/08/22 1147 09/08/22 1627 09/09/22 0147 09/10/22 0242  WBC 9.3  --   --  9.4 11.1*  NEUTROABS 5.2  --   --   --   --   HGB 7.7* 8.2* 8.0* 7.6* 7.2*  HCT 24.2* 24.0* 24.6* 23.6* 21.9*  MCV 100.0  --   --  97.5 98.2  PLT 269  --   --  323 121    Basic Metabolic Panel: Recent Labs  Lab 09/08/22 1138 09/08/22 1147 09/09/22 0147 09/10/22 0242  NA 141 139 136 133*  K 3.5 3.5 5.1 5.7*  CL 102 104 100 93*  CO2 24  --  23 24  GLUCOSE 158* 154* 131* 114*  BUN 19 21* 31* 56*  CREATININE 6.67* 7.50* 8.74* 11.09*  CALCIUM 10.0  --  10.0 9.0  MG 1.7  --   --   --     GFR: Estimated Creatinine Clearance: 8.5 mL/min ([name]) (by C-G formula based on SCr of 11.09 mg/dL (H)).  Liver Function Tests: Recent Labs  Lab 09/08/22 1138 09/09/22 0147  AST 80* 57*  ALT 57* 49*  ALKPHOS 88 85  BILITOT 0.2* 0.2*  PROT 6.6 7.1  ALBUMIN 3.0* 3.1*    CBG: Recent Labs  Lab 09/08/22 1215  GLUCAP 107*      No results found for this or any previous visit (from the past 240 hour(s)).       Radiology Studies: No results found.      Scheduled Meds:  amiodarone  200 mg Oral Daily   amLODipine  10 mg Oral QPC lunch   bictegravir-emtricitabine-tenofovir AF  1 tablet Oral Daily   budesonide (PULMICORT) nebulizer solution  0.25 mg Nebulization BID   calcitRIOL  1.75 mcg Oral Q M,W,F-HD   cinacalcet  60 mg Oral Q M,W,F   famotidine  20 mg Oral Daily   ferric citrate  210 mg Oral With snacks   ferric citrate  420 mg Oral TID WC   heparin sodium (porcine)       levothyroxine  25 mcg Oral Q0600  lidocaine  1 patch Transdermal Q24H   loratadine  10 mg Oral Daily   lubiprostone  24 mcg Oral BID WC   metoprolol tartrate  25 mg Oral BID   mometasone-formoterol  2 puff Inhalation BID   oxyCODONE  5 mg Oral Q6H   pantoprazole  40 mg Oral Daily   rOPINIRole  1 mg Oral QHS   rosuvastatin  10 mg Oral Daily   sodium chloride flush  3 mL Intravenous Q12H   Continuous Infusions:  sodium chloride     heparin 1,400 Units/hr (09/10/22 1429)     LOS: 2 days    Time spent: over 30 min    Fayrene Helper, MD [facility]   To contact the attending provider between 7A-7P or the covering provider during after hours 7P-7A, please log into the web site [email or web address] and access using universal [place] password for that web site. If you do not have the password, please call the hospital operator.  09/10/2022, 2:42 PM

## 2022-09-10 NOTE — Progress Notes (Signed)
RN paged reporting patient with worsening chest pain, improved with dilaudid, VSS,  EKG obtained, no acute changes. LHC 08/01/22 with mod non-obstructive disease. Repeat cath pending tomorrow. Had CPR and known  fractures in the anterior aspect of left and right third, fourth, fifth and sixth ribs. Agree with PRN pain meds per primary team. Monitor response to pain meds.

## 2022-09-10 NOTE — H&P (View-Only) (Signed)
Rounding Note    Patient Name: Albert Eaton Date of Encounter: 09/10/2022  [place] Cardiologist: [name], MD   Subjective   Pt seen in HD. Complains of pleuritic chest pain that is worse with certain positions or movement.   Inpatient Medications    Scheduled Meds:  amiodarone  200 mg Oral Daily   amLODipine  10 mg Oral QPC lunch   apixaban  5 mg Oral BID   bictegravir-emtricitabine-tenofovir AF  1 tablet Oral Daily   budesonide (PULMICORT) nebulizer solution  0.25 mg Nebulization BID   calcitRIOL  1.75 mcg Oral Q M,W,F-HD   cinacalcet  60 mg Oral Q M,W,F   famotidine  20 mg Oral BID   ferric citrate  210 mg Oral With snacks   ferric citrate  420 mg Oral TID WC   levothyroxine  25 mcg Oral Q0600   lidocaine  1 patch Transdermal Q24H   loratadine  10 mg Oral Daily   lubiprostone  24 mcg Oral BID WC   metoprolol tartrate  25 mg Oral BID   mometasone-formoterol  2 puff Inhalation BID   oxyCODONE  5 mg Oral Q6H   pantoprazole  40 mg Oral Daily   rOPINIRole  1 mg Oral QHS   rosuvastatin  10 mg Oral Daily   sodium chloride flush  3 mL Intravenous Q12H   Continuous Infusions:  sodium chloride     PRN Meds: sodium chloride, acetaminophen, azelastine, gabapentin, heparin, HYDROmorphone (DILAUDID) injection, levalbuterol, lidocaine (PF), lidocaine-prilocaine, metoCLOPramide (REGLAN) injection, oxyCODONE, pentafluoroprop-tetrafluoroeth, sodium chloride flush   Vital Signs    Vitals:   09/09/22 1924 09/09/22 2042 09/10/22 0428 09/10/22 0517  BP:  (!) 140/101 131/87   Pulse:  77 73   Resp:  19 17   Temp:  98 F (36.7 C) 99 F (37.2 C)   TempSrc:  Oral Oral (P) Oral  SpO2: 100% 100% 100%   Weight:   94.3 kg   Height:        Intake/Output Summary (Last 24 hours) at 09/10/2022 0737 Last data filed at 09/09/2022 2139 Gross per 24 hour  Intake 104.43 ml  Output --  Net 104.43 ml      09/10/2022    4:28 AM 09/09/2022    3:52 AM 09/08/2022    11:37 AM  Last 3 Weights  Weight (lbs) 208 lb 204 lb 9.6 oz 199 lb  Weight (kg) 94.348 kg 92.806 kg 90.266 kg      Telemetry    Sinus rhythm in the 70s, paired PVCs x1 episode - Personally Reviewed  ECG    No new tracings - Personally Reviewed  Physical Exam   GEN: No acute distress - resting in HD, not moving much Neck: No JVD Cardiac: RRR, no murmurs, rubs, or gallops.  Respiratory: Clear to auscultation bilaterally. GI: Soft, nontender, non-distended  MS: No edema; No deformity. Neuro:  Nonfocal  Psych: Normal affect   Labs    High Sensitivity Troponin:   Recent Labs  Lab 08/29/22 1505 08/29/22 1645 09/08/22 1138 09/08/22 1455 09/09/22 0502  TROPONINIHS 160* 148* 54* 606* 582*     Chemistry Recent Labs  Lab 09/08/22 1138 09/08/22 1147 09/09/22 0147 09/10/22 0242  NA 141 139 136 133*  K 3.5 3.5 5.1 5.7*  CL 102 104 100 93*  CO2 24  --  23 24  GLUCOSE 158* 154* 131* 114*  BUN 19 21* 31* 56*  CREATININE 6.67* 7.50* 8.74* 11.09*  CALCIUM  10.0  --  10.0 9.0  MG 1.7  --   --   --   PROT 6.6  --  7.1  --   ALBUMIN 3.0*  --  3.1*  --   AST 80*  --  57*  --   ALT 57*  --  49*  --   ALKPHOS 88  --  85  --   BILITOT 0.2*  --  0.2*  --   GFRNONAA 9*  --  7* 5*  ANIONGAP 15  --  13 16*    Lipids No results for input(s): "CHOL", "TRIG", "HDL", "LABVLDL", "LDLCALC", "CHOLHDL" in the last 168 hours.  Hematology Recent Labs  Lab 09/08/22 1138 09/08/22 1147 09/08/22 1627 09/09/22 0147 09/10/22 0242  WBC 9.3  --   --  9.4 11.1*  RBC 2.42*  --   --  2.42* 2.23*  HGB 7.7*   < > 8.0* 7.6* 7.2*  HCT 24.2*   < > 24.6* 23.6* 21.9*  MCV 100.0  --   --  97.5 98.2  MCH 31.8  --   --  31.4 32.3  MCHC 31.8  --   --  32.2 32.9  RDW 15.7*  --   --  16.0* 16.6*  PLT 269  --   --  323 291   < > = values in this interval not displayed.   Thyroid  Recent Labs  Lab 09/08/22 1205  TSH 2.868    BNP Recent Labs  Lab 09/08/22 1205  BNP 2,787.4*    DDimer No  results for input(s): "DDIMER" in the last 168 hours.   Radiology    CT CHEST ABDOMEN PELVIS W CONTRAST  Result Date: 09/08/2022 CLINICAL DATA:  Post code, chest pain, shortness of breath EXAM: CT CHEST, ABDOMEN, AND PELVIS WITH CONTRAST TECHNIQUE: Multidetector CT imaging of the chest, abdomen and pelvis was performed following the standard protocol during bolus administration of intravenous contrast. RADIATION DOSE REDUCTION: This exam was performed according to the departmental dose-optimization program which includes automated exposure control, adjustment of the mA and/or kV according to patient size and/or use of iterative reconstruction technique. CONTRAST:  2m OMNIPAQUE IOHEXOL 350 MG/ML SOLN COMPARISON:  CT chest done on 08/29/2022 the FINDINGS: CT CHEST FINDINGS Cardiovascular: Extensive coronary artery calcifications are seen. There is metallic density in mitral annulus suggesting prosthetic cardiac valve. Heart is enlarged in size. Small pericardial effusion is present. There is homogeneous enhancement in thoracic aorta. There are no intraluminal filling defects in central pulmonary artery branches. Mediastinum/Nodes: There is no mediastinal hematoma. Lungs/Pleura: There is interval decrease in bilateral pleural effusions. Small patchy infiltrates are seen in both mid and both lower lung fields, more so on the right side. Small right pleural effusion is seen. Few blebs are seen in the left apex. There is no demonstrable pneumothorax. Musculoskeletal: Slightly displaced fractures are seen in the anterior aspects of left third, fourth, fifth and sixth ribs. There are undisplaced fractures in the anterior right third, fourth, fifth and sixth ribs CT ABDOMEN PELVIS FINDINGS Hepatobiliary: No focal abnormalities are seen. There is no dilation of bile ducts. Surgical clips are seen in gallbladder fossa. Pancreas: No focal abnormalities are seen. Spleen: Unremarkable. Adrenals/Urinary Tract: Left kidney  is not seen suggesting left nephrectomy. Adrenals are unremarkable. There is no hydronephrosis in the right kidney. There is 2.1 cm low-density structure in the parapelvic region in right kidney suggesting renal cyst. No follow-up is recommended. There is no right renal or right  ureteral stone. Urinary bladder is not distended. Stomach/Bowel: Stomach is unremarkable. Small bowel loops are not dilated. Appendix is not dilated. There is no significant focal wall thickening in colon. There is mild diffuse wall thickening in sigmoid colon and rectum. There is no pericolic stranding. Vascular/Lymphatic: Arterial calcifications are seen. Reproductive: Prostate is not enlarged Other: There is no ascites or pneumoperitoneum. Bilateral inguinal hernias containing fat are seen. Musculoskeletal: Schmorl's nodes are seen in multiple lumbar and thoracic vertebral bodies. Multiple bilateral rib fractures as described earlier. IMPRESSION: There are recent fractures in the anterior aspect of left and right third, fourth, fifth and sixth ribs. There is no pneumothorax. Major vascular structures in the mediastinum appear intact. Small right pleural effusion. There are patchy infiltrates in mid and lower lung fields suggesting atelectasis/pneumonia. Cardiomegaly.  Coronary artery disease.  Small pericardial effusion. There is no laceration in solid organs. Fatty liver. Right renal cysts. There is mild diffuse wall thickening in rectosigmoid patient may be due to incomplete distention or suggest nonspecific mild colitis. There is no evidence of intestinal obstruction or pneumoperitoneum. There is no hydronephrosis in the right kidney. There is previous left nephrectomy. Other findings as described in the body of the report. Electronically Signed   By: Elmer Picker M.D.   On: 09/08/2022 13:42   CT Head Wo Contrast  Result Date: 09/08/2022 CLINICAL DATA:  Anoxic brain damage cpr EXAM: CT HEAD WITHOUT CONTRAST TECHNIQUE:  Contiguous axial images were obtained from the base of the skull through the vertex without intravenous contrast. RADIATION DOSE REDUCTION: This exam was performed according to the departmental dose-optimization program which includes automated exposure control, adjustment of the mA and/or kV according to patient size and/or use of iterative reconstruction technique. COMPARISON:  08/22/2022 FINDINGS: Brain: Mild atrophy. No evidence of acute infarction, hemorrhage, hydrocephalus, extra-axial collection or mass lesion/mass effect. Vascular: Atherosclerotic and physiologic intracranial calcifications. Skull: Normal. Negative for fracture or focal lesion. Sinuses/Orbits: No acute finding. Other: None IMPRESSION: 1. No acute intracranial findings. 2. Mild atrophy. Electronically Signed   By: Lucrezia Europe M.D.   On: 09/08/2022 13:08   DG Chest Portable 1 View  Result Date: 09/08/2022 CLINICAL DATA:  CPR EXAM: PORTABLE CHEST 1 VIEW COMPARISON:  08/21/2022, 09/02/2022 FINDINGS: Patient is slightly rotated. Stable cardiomegaly. Prosthetic cardiac valve. Pulmonary vascular congestion. Perihilar and bibasilar interstitial prominence. No focal airspace consolidation, pleural effusion, or pneumothorax. No acute bony findings are evident. IMPRESSION: Cardiomegaly with pulmonary vascular congestion and mild interstitial edema. Electronically Signed   By: Davina Poke D.O.   On: 09/08/2022 12:14    Cardiac Studies     Patient Profile     56 y.o. male  with history of nonobstructive CAD, renal cell carcinoma s/p left nephrectomy 01/2021, end-stage renal disease on hemodialysis (MWF), hypertension, hyperlipidemia, HFpEF, HIV, atrial tachycardia who recently underwent mitral valve repair 13/0865 >> complicated hospital course by myoclonus, postop atrial fibrillation (treated with amiodarone ) and respiratory arrest requiring reintubation and CVA.  Readmitted 11/3-11/5 for pleuritic chest pain in setting of a flutter  RVR.  Placed on Eliquis for anticoagulation.  Now presenting from dialysis sessions for cardiac arrest. He received 2 minutes of CPR with 2 doses of epinephrine and 1 g of calcium given with return of spontaneous circulation.  The monitor showed SVT versus atrial fibrillation.  His chest x-ray here showed mild edema.    Assessment & Plan    Cardiac arrest at dialysis Elevated troponin HS troponin peaked at 606 - suspect  demand ischemia in the setting of ESRD and CPR - known nonobstructive CAD by heart cath 08/01/22 - etiology of arrest unclear, one possibility could be a change in anatomy following recent mitral valve repair that could result in compression or kinking of LCX - will plan relook cath today after HD   MR s/p Mitral valve repair 08/21/22 Echo this admission   Paroxysmal atrial fibrillation / flutter Chronic anticoagulation - telemetry during cardiac arrest and post arrest with SVT vs Afib RVR - on amiodarone 200 mg + lopressor 25 mg BID - transition eliquis to heparin gtt   Mild cardiomyopathy, grade II DD, moderate RV enlargement - echo this admission showed stable LVEF 45-50% with moderate RV enlargement - fluid status managed with HD   Chest wall pain after CPR Suspect chest pain is MSK given that its worse with deep inspiration and with movement. - consider trial of imdur tonight or tomorrow depending on BP, do not start in HD for now   Pt received eliquis last night. Will do heart cath tomorrow.      For questions or updates, please contact [place] Please consult [email or web address] for contact info under        Signed, Ledora Bottcher, PA  09/10/2022, 7:37 AM     ATTENDING ATTESTATION:  After conducting a review of all available clinical information with the care team, interviewing the patient, and performing a physical exam, I agree with the findings and plan described in this note.   GEN: No acute distress.   HEENT:  MMM, no JVD, no  scleral icterus Cardiac: RRR, no murmurs, rubs, or gallops.  Respiratory: Clear to auscultation bilaterally. GI: Soft, nontender, non-distended  MS: No edema; No deformity. Neuro:  Nonfocal  Vasc:  +2 radial pulses  No acute issues overnight.  Patient seen at dialysis.  He still endorses atypical chest pain exacerbated by inspiration and seems positional.  Will proceed to coronary angiography given unclear etiology of cardiac arrest (PEA arrest as ROSC obtained without defibrillation).  If coronary angiography reassuring, may consider cardiac MRI but this would be more associated with VT/VF arrest from scar versus a PEA arrest.  It may be that electrolyte shifts during dialysis was the issue.    Lenna Sciara, MD Pager [phone]

## 2022-09-10 NOTE — Consult Note (Signed)
   [facility] Inpatient Consult   09/10/2022  Albert Eaton 03-05-1966 [id number]  [facility] [ACO] Patient: [name]  Primary Care Provider:  [name], NP   Patient screened for less than 30 days readmission hospitalization with noted extreme  high risk score for unplanned readmission risk to assess for potential [place] [facility] service needs for post hospital transition for care coordination.  .  1540: Came by to speak with patient. Patient with staff/lab at this time.   Plan:  Continue to follow progress and disposition to assess for post hospital community care coordination/management needs.  Referral request for community care coordination: ongoing following.  Of note, [facility] Care Management/Population Health does not replace or interfere with any arrangements made by the Inpatient Transition of Care team.  For questions contact:   [name], RN BSN [place]  [phone] business mobile phone Toll free office [phone]  *[place]  [phone] Fax number: [phone] [email or web address]

## 2022-09-10 NOTE — Progress Notes (Addendum)
Rounding Note    Patient Name: Albert Eaton Date of Encounter: 09/10/2022  [place] Cardiologist: [name], MD   Subjective   Pt seen in HD. Complains of pleuritic chest pain that is worse with certain positions or movement.   Inpatient Medications    Scheduled Meds:  amiodarone  200 mg Oral Daily   amLODipine  10 mg Oral QPC lunch   apixaban  5 mg Oral BID   bictegravir-emtricitabine-tenofovir AF  1 tablet Oral Daily   budesonide (PULMICORT) nebulizer solution  0.25 mg Nebulization BID   calcitRIOL  1.75 mcg Oral Q M,W,F-HD   cinacalcet  60 mg Oral Q M,W,F   famotidine  20 mg Oral BID   ferric citrate  210 mg Oral With snacks   ferric citrate  420 mg Oral TID WC   levothyroxine  25 mcg Oral Q0600   lidocaine  1 patch Transdermal Q24H   loratadine  10 mg Oral Daily   lubiprostone  24 mcg Oral BID WC   metoprolol tartrate  25 mg Oral BID   mometasone-formoterol  2 puff Inhalation BID   oxyCODONE  5 mg Oral Q6H   pantoprazole  40 mg Oral Daily   rOPINIRole  1 mg Oral QHS   rosuvastatin  10 mg Oral Daily   sodium chloride flush  3 mL Intravenous Q12H   Continuous Infusions:  sodium chloride     PRN Meds: sodium chloride, acetaminophen, azelastine, gabapentin, heparin, HYDROmorphone (DILAUDID) injection, levalbuterol, lidocaine (PF), lidocaine-prilocaine, metoCLOPramide (REGLAN) injection, oxyCODONE, pentafluoroprop-tetrafluoroeth, sodium chloride flush   Vital Signs    Vitals:   09/09/22 1924 09/09/22 2042 09/10/22 0428 09/10/22 0517  BP:  (!) 140/101 131/87   Pulse:  77 73   Resp:  19 17   Temp:  98 F (36.7 C) 99 F (37.2 C)   TempSrc:  Oral Oral (P) Oral  SpO2: 100% 100% 100%   Weight:   94.3 kg   Height:        Intake/Output Summary (Last 24 hours) at 09/10/2022 0737 Last data filed at 09/09/2022 2139 Gross per 24 hour  Intake 104.43 ml  Output --  Net 104.43 ml      09/10/2022    4:28 AM 09/09/2022    3:52 AM 09/08/2022    11:37 AM  Last 3 Weights  Weight (lbs) 208 lb 204 lb 9.6 oz 199 lb  Weight (kg) 94.348 kg 92.806 kg 90.266 kg      Telemetry    Sinus rhythm in the 70s, paired PVCs x1 episode - Personally Reviewed  ECG    No new tracings - Personally Reviewed  Physical Exam   GEN: No acute distress - resting in HD, not moving much Neck: No JVD Cardiac: RRR, no murmurs, rubs, or gallops.  Respiratory: Clear to auscultation bilaterally. GI: Soft, nontender, non-distended  MS: No edema; No deformity. Neuro:  Nonfocal  Psych: Normal affect   Labs    High Sensitivity Troponin:   Recent Labs  Lab 08/29/22 1505 08/29/22 1645 09/08/22 1138 09/08/22 1455 09/09/22 0502  TROPONINIHS 160* 148* 54* 606* 582*     Chemistry Recent Labs  Lab 09/08/22 1138 09/08/22 1147 09/09/22 0147 09/10/22 0242  NA 141 139 136 133*  K 3.5 3.5 5.1 5.7*  CL 102 104 100 93*  CO2 24  --  23 24  GLUCOSE 158* 154* 131* 114*  BUN 19 21* 31* 56*  CREATININE 6.67* 7.50* 8.74* 11.09*  CALCIUM  10.0  --  10.0 9.0  MG 1.7  --   --   --   PROT 6.6  --  7.1  --   ALBUMIN 3.0*  --  3.1*  --   AST 80*  --  57*  --   ALT 57*  --  49*  --   ALKPHOS 88  --  85  --   BILITOT 0.2*  --  0.2*  --   GFRNONAA 9*  --  7* 5*  ANIONGAP 15  --  13 16*    Lipids No results for input(s): "CHOL", "TRIG", "HDL", "LABVLDL", "LDLCALC", "CHOLHDL" in the last 168 hours.  Hematology Recent Labs  Lab 09/08/22 1138 09/08/22 1147 09/08/22 1627 09/09/22 0147 09/10/22 0242  WBC 9.3  --   --  9.4 11.1*  RBC 2.42*  --   --  2.42* 2.23*  HGB 7.7*   < > 8.0* 7.6* 7.2*  HCT 24.2*   < > 24.6* 23.6* 21.9*  MCV 100.0  --   --  97.5 98.2  MCH 31.8  --   --  31.4 32.3  MCHC 31.8  --   --  32.2 32.9  RDW 15.7*  --   --  16.0* 16.6*  PLT 269  --   --  323 291   < > = values in this interval not displayed.   Thyroid  Recent Labs  Lab 09/08/22 1205  TSH 2.868    BNP Recent Labs  Lab 09/08/22 1205  BNP 2,787.4*    DDimer No  results for input(s): "DDIMER" in the last 168 hours.   Radiology    CT CHEST ABDOMEN PELVIS W CONTRAST  Result Date: 09/08/2022 CLINICAL DATA:  Post code, chest pain, shortness of breath EXAM: CT CHEST, ABDOMEN, AND PELVIS WITH CONTRAST TECHNIQUE: Multidetector CT imaging of the chest, abdomen and pelvis was performed following the standard protocol during bolus administration of intravenous contrast. RADIATION DOSE REDUCTION: This exam was performed according to the departmental dose-optimization program which includes automated exposure control, adjustment of the mA and/or kV according to patient size and/or use of iterative reconstruction technique. CONTRAST:  15m OMNIPAQUE IOHEXOL 350 MG/ML SOLN COMPARISON:  CT chest done on 08/29/2022 the FINDINGS: CT CHEST FINDINGS Cardiovascular: Extensive coronary artery calcifications are seen. There is metallic density in mitral annulus suggesting prosthetic cardiac valve. Heart is enlarged in size. Small pericardial effusion is present. There is homogeneous enhancement in thoracic aorta. There are no intraluminal filling defects in central pulmonary artery branches. Mediastinum/Nodes: There is no mediastinal hematoma. Lungs/Pleura: There is interval decrease in bilateral pleural effusions. Small patchy infiltrates are seen in both mid and both lower lung fields, more so on the right side. Small right pleural effusion is seen. Few blebs are seen in the left apex. There is no demonstrable pneumothorax. Musculoskeletal: Slightly displaced fractures are seen in the anterior aspects of left third, fourth, fifth and sixth ribs. There are undisplaced fractures in the anterior right third, fourth, fifth and sixth ribs CT ABDOMEN PELVIS FINDINGS Hepatobiliary: No focal abnormalities are seen. There is no dilation of bile ducts. Surgical clips are seen in gallbladder fossa. Pancreas: No focal abnormalities are seen. Spleen: Unremarkable. Adrenals/Urinary Tract: Left kidney  is not seen suggesting left nephrectomy. Adrenals are unremarkable. There is no hydronephrosis in the right kidney. There is 2.1 cm low-density structure in the parapelvic region in right kidney suggesting renal cyst. No follow-up is recommended. There is no right renal or right  ureteral stone. Urinary bladder is not distended. Stomach/Bowel: Stomach is unremarkable. Small bowel loops are not dilated. Appendix is not dilated. There is no significant focal wall thickening in colon. There is mild diffuse wall thickening in sigmoid colon and rectum. There is no pericolic stranding. Vascular/Lymphatic: Arterial calcifications are seen. Reproductive: Prostate is not enlarged Other: There is no ascites or pneumoperitoneum. Bilateral inguinal hernias containing fat are seen. Musculoskeletal: Schmorl's nodes are seen in multiple lumbar and thoracic vertebral bodies. Multiple bilateral rib fractures as described earlier. IMPRESSION: There are recent fractures in the anterior aspect of left and right third, fourth, fifth and sixth ribs. There is no pneumothorax. Major vascular structures in the mediastinum appear intact. Small right pleural effusion. There are patchy infiltrates in mid and lower lung fields suggesting atelectasis/pneumonia. Cardiomegaly.  Coronary artery disease.  Small pericardial effusion. There is no laceration in solid organs. Fatty liver. Right renal cysts. There is mild diffuse wall thickening in rectosigmoid patient may be due to incomplete distention or suggest nonspecific mild colitis. There is no evidence of intestinal obstruction or pneumoperitoneum. There is no hydronephrosis in the right kidney. There is previous left nephrectomy. Other findings as described in the body of the report. Electronically Signed   By: Elmer Picker M.D.   On: 09/08/2022 13:42   CT Head Wo Contrast  Result Date: 09/08/2022 CLINICAL DATA:  Anoxic brain damage cpr EXAM: CT HEAD WITHOUT CONTRAST TECHNIQUE:  Contiguous axial images were obtained from the base of the skull through the vertex without intravenous contrast. RADIATION DOSE REDUCTION: This exam was performed according to the departmental dose-optimization program which includes automated exposure control, adjustment of the mA and/or kV according to patient size and/or use of iterative reconstruction technique. COMPARISON:  08/22/2022 FINDINGS: Brain: Mild atrophy. No evidence of acute infarction, hemorrhage, hydrocephalus, extra-axial collection or mass lesion/mass effect. Vascular: Atherosclerotic and physiologic intracranial calcifications. Skull: Normal. Negative for fracture or focal lesion. Sinuses/Orbits: No acute finding. Other: None IMPRESSION: 1. No acute intracranial findings. 2. Mild atrophy. Electronically Signed   By: Lucrezia Europe M.D.   On: 09/08/2022 13:08   DG Chest Portable 1 View  Result Date: 09/08/2022 CLINICAL DATA:  CPR EXAM: PORTABLE CHEST 1 VIEW COMPARISON:  08/21/2022, 09/02/2022 FINDINGS: Patient is slightly rotated. Stable cardiomegaly. Prosthetic cardiac valve. Pulmonary vascular congestion. Perihilar and bibasilar interstitial prominence. No focal airspace consolidation, pleural effusion, or pneumothorax. No acute bony findings are evident. IMPRESSION: Cardiomegaly with pulmonary vascular congestion and mild interstitial edema. Electronically Signed   By: Davina Poke D.O.   On: 09/08/2022 12:14    Cardiac Studies     Patient Profile     56 y.o. male  with history of nonobstructive CAD, renal cell carcinoma s/p left nephrectomy 01/2021, end-stage renal disease on hemodialysis (MWF), hypertension, hyperlipidemia, HFpEF, HIV, atrial tachycardia who recently underwent mitral valve repair [id number] >> complicated hospital course by myoclonus, postop atrial fibrillation (treated with amiodarone ) and respiratory arrest requiring reintubation and CVA.  Readmitted 11/3-11/5 for pleuritic chest pain in setting of a flutter  RVR.  Placed on Eliquis for anticoagulation.  Now presenting from dialysis sessions for cardiac arrest. He received 2 minutes of CPR with 2 doses of epinephrine and 1 g of calcium given with return of spontaneous circulation.  The monitor showed SVT versus atrial fibrillation.  His chest x-ray here showed mild edema.    Assessment & Plan    Cardiac arrest at dialysis Elevated troponin HS troponin peaked at 606 - suspect  demand ischemia in the setting of ESRD and CPR - known nonobstructive CAD by heart cath 08/01/22 - etiology of arrest unclear, one possibility could be a change in anatomy following recent mitral valve repair that could result in compression or kinking of LCX - will plan relook cath today after HD   MR s/p Mitral valve repair 08/21/22 Echo this admission   Paroxysmal atrial fibrillation / flutter Chronic anticoagulation - telemetry during cardiac arrest and post arrest with SVT vs Afib RVR - on amiodarone 200 mg + lopressor 25 mg BID - transition eliquis to heparin gtt   Mild cardiomyopathy, grade II DD, moderate RV enlargement - echo this admission showed stable LVEF 45-50% with moderate RV enlargement - fluid status managed with HD   Chest wall pain after CPR Suspect chest pain is MSK given that its worse with deep inspiration and with movement. - consider trial of imdur tonight or tomorrow depending on BP, do not start in HD for now   Pt received eliquis last night. Will do heart cath tomorrow.      For questions or updates, please contact [place] Please consult [email or web address] for contact info under        Signed, Ledora Bottcher, PA  09/10/2022, 7:37 AM     ATTENDING ATTESTATION:  After conducting a review of all available clinical information with the care team, interviewing the patient, and performing a physical exam, I agree with the findings and plan described in this note.   GEN: No acute distress.   HEENT:  MMM, no JVD, no  scleral icterus Cardiac: RRR, no murmurs, rubs, or gallops.  Respiratory: Clear to auscultation bilaterally. GI: Soft, nontender, non-distended  MS: No edema; No deformity. Neuro:  Nonfocal  Vasc:  +2 radial pulses  No acute issues overnight.  Patient seen at dialysis.  He still endorses atypical chest pain exacerbated by inspiration and seems positional.  Will proceed to coronary angiography given unclear etiology of cardiac arrest (PEA arrest as ROSC obtained without defibrillation).  If coronary angiography reassuring, may consider cardiac MRI but this would be more associated with VT/VF arrest from scar versus a PEA arrest.  It may be that electrolyte shifts during dialysis was the issue.    Lenna Sciara, MD Pager [phone]

## 2022-09-10 NOTE — Progress Notes (Signed)
[facility] Progress Note   Subjective: Seen in [place]. Completed dialysis with 4L removed. Chest remains sore, but breathing better  Objective Vitals:   09/10/22 1030 09/10/22 1100 09/10/22 1130 09/10/22 1200  BP: 113/81 121/82 129/86 117/84  Pulse: 74 72 74 74  Resp: 14 12 10 12   Temp:      TempSrc:      SpO2: 100% 100% 100% 100%  Weight:      Height:          Additional Objective Labs: Basic Metabolic Panel: Recent Labs  Lab 09/08/22 1138 09/08/22 1147 09/09/22 0147 09/10/22 0242  NA 141 139 136 133*  K 3.5 3.5 5.1 5.7*  CL 102 104 100 93*  CO2 24  --  23 24  GLUCOSE 158* 154* 131* 114*  BUN 19 21* 31* 56*  CREATININE 6.67* 7.50* 8.74* 11.09*  CALCIUM 10.0  --  10.0 9.0    CBC: Recent Labs  Lab 09/08/22 1138 09/08/22 1147 09/08/22 1627 09/09/22 0147 09/10/22 0242  WBC 9.3  --   --  9.4 11.1*  NEUTROABS 5.2  --   --   --   --   HGB 7.7*   < > 8.0* 7.6* 7.2*  HCT 24.2*   < > 24.6* 23.6* 21.9*  MCV 100.0  --   --  97.5 98.2  PLT 269  --   --  323 291   < > = values in this interval not displayed.    Blood Culture    Component Value Date/Time   SDES  08/29/2022 1635    BLOOD RIGHT FOREARM Performed at [facility], [place] [address], [place], [place] [address]    [facility]  08/29/2022 1635    Blood Culture adequate volume BOTTLES DRAWN AEROBIC AND ANAEROBIC Performed at [facility], [address], [place], [place] [address]    CULT  08/29/2022 1635    NO GROWTH 5 DAYS Performed at [facility] [address], [place], [place] [address]    REPTSTATUS 09/03/2022 FINAL 08/29/2022 1635     Physical Exam General: Alert, nad on nasal oxygen Heart: RRR No m,r,g  Lungs: Clear bilaterally  Abdomen: soft non-tender Extremities: No sig LE edema Dialysis Access: L AVF +bruit   Medications:  sodium chloride     heparin      amiodarone  200 mg Oral Daily   amLODipine  10 mg Oral QPC lunch    bictegravir-emtricitabine-tenofovir AF  1 tablet Oral Daily   budesonide (PULMICORT) nebulizer solution  0.25 mg Nebulization BID   calcitRIOL  1.75 mcg Oral Q M,W,F-HD   cinacalcet  60 mg Oral Q M,W,F   famotidine  20 mg Oral BID   ferric citrate  210 mg Oral With snacks   ferric citrate  420 mg Oral TID WC   heparin sodium (porcine)       levothyroxine  25 mcg Oral Q0600   lidocaine  1 patch Transdermal Q24H   loratadine  10 mg Oral Daily   lubiprostone  24 mcg Oral BID WC   metoprolol tartrate  25 mg Oral BID   mometasone-formoterol  2 puff Inhalation BID   oxyCODONE  5 mg Oral Q6H   pantoprazole  40 mg Oral Daily   rOPINIRole  1 mg Oral QHS   rosuvastatin  10 mg Oral Daily   sodium chloride flush  3 mL Intravenous Q12H    Dialysis Orders:  [facility] MWF 4h  400/800 EDW 87.1 kg 3K/2.5Ca AVF Heparin 3000  -Mircera 200 q 2 wks (last 11/10) -Calcitriol 1.75 TIW -Sensipar 30 TIW    Assessment/Plan: Cardiac arrest during dialysis. Required 2 mins CPR. Unclear etiology. Per cardiology/primary  ESRD -  HD MWF. Incomplete treatment Monday. Next HD 11/15.  Hypertension/volume  - BP acceptable. Volume up -5kg over dry weight. UF to EDW as able.  Anemia  - Recent ESA dose as outpatient. Hb trending down -check Fe studies  Metabolic bone disease -  Continue home binders/Sensipar  Afib - new onset. On amiodarone/Eliquis  Nutrition - Renal diet with fluid restriction  Hepatitis B - dialysis in isolation room  S/p MVR    Lynnda Child PA-C [place] 09/10/2022,12:19 PM

## 2022-09-11 ENCOUNTER — Encounter (HOSPITAL_COMMUNITY): Payer: Self-pay | Admitting: Interventional Cardiology

## 2022-09-11 ENCOUNTER — Ambulatory Visit (HOSPITAL_COMMUNITY): Payer: Medicare Other | Admitting: Physician Assistant

## 2022-09-11 ENCOUNTER — Inpatient Hospital Stay (HOSPITAL_BASED_OUTPATIENT_CLINIC_OR_DEPARTMENT_OTHER)
Admit: 2022-09-11 | Discharge: 2022-09-11 | Disposition: A | Discharge status: Home / Self Care | Payer: Medicare Other | Attending: Physician Assistant | Admitting: Physician Assistant

## 2022-09-11 ENCOUNTER — Encounter (HOSPITAL_COMMUNITY)
Admission: EM | Disposition: A | Discharge status: Home / Self Care | Payer: Self-pay | Source: Home / Self Care | Attending: Family Medicine

## 2022-09-11 DIAGNOSIS — I2510 Atherosclerotic heart disease of native coronary artery without angina pectoris: Secondary | ICD-10-CM

## 2022-09-11 DIAGNOSIS — I480 Paroxysmal atrial fibrillation: Secondary | ICD-10-CM | POA: Diagnosis not present

## 2022-09-11 DIAGNOSIS — I469 Cardiac arrest, cause unspecified: Secondary | ICD-10-CM | POA: Diagnosis not present

## 2022-09-11 HISTORY — PX: LEFT HEART CATH AND CORONARY ANGIOGRAPHY: CATH118249

## 2022-09-11 LAB — CBC
HCT: 23.6 % — ABNORMAL LOW (ref 39.0–52.0)
Hemoglobin: 7.5 g/dL — ABNORMAL LOW (ref 13.0–17.0)
MCH: 31.6 pg (ref 26.0–34.0)
MCHC: 31.8 g/dL (ref 30.0–36.0)
MCV: 99.6 fL (ref 80.0–100.0)
Platelets: 357 10*3/uL (ref 150–400)
RBC: 2.37 MIL/uL — ABNORMAL LOW (ref 4.22–5.81)
RDW: 16.3 % — ABNORMAL HIGH (ref 11.5–15.5)
WBC: 8.2 10*3/uL (ref 4.0–10.5)
nRBC: 0.4 % — ABNORMAL HIGH (ref 0.0–0.2)

## 2022-09-11 LAB — IRON AND TIBC
Iron: 62 ug/dL (ref 45–182)
Saturation Ratios: 26 % (ref 17.9–39.5)
TIBC: 241 ug/dL — ABNORMAL LOW (ref 250–450)
UIBC: 179 ug/dL

## 2022-09-11 LAB — PHOSPHORUS: Phosphorus: 4.1 mg/dL (ref 2.5–4.6)

## 2022-09-11 LAB — COMPREHENSIVE METABOLIC PANEL
ALT: 31 U/L (ref 0–44)
AST: 20 U/L (ref 15–41)
Albumin: 3.2 g/dL — ABNORMAL LOW (ref 3.5–5.0)
Alkaline Phosphatase: 90 U/L (ref 38–126)
Anion gap: 17 — ABNORMAL HIGH (ref 5–15)
BUN: 39 mg/dL — ABNORMAL HIGH (ref 6–20)
CO2: 25 mmol/L (ref 22–32)
Calcium: 8.1 mg/dL — ABNORMAL LOW (ref 8.9–10.3)
Chloride: 94 mmol/L — ABNORMAL LOW (ref 98–111)
Creatinine, Ser: 8.1 mg/dL — ABNORMAL HIGH (ref 0.61–1.24)
GFR, Estimated: 7 mL/min — ABNORMAL LOW (ref >60)
Glucose, Bld: 111 mg/dL — ABNORMAL HIGH (ref 70–99)
Potassium: 4.5 mmol/L (ref 3.5–5.1)
Sodium: 136 mmol/L (ref 135–145)
Total Bilirubin: 0.3 mg/dL (ref 0.3–1.2)
Total Protein: 7.2 g/dL (ref 6.5–8.1)

## 2022-09-11 LAB — FERRITIN: Ferritin: 845 ng/mL — ABNORMAL HIGH (ref 24–336)

## 2022-09-11 LAB — MAGNESIUM: Magnesium: 1.8 mg/dL (ref 1.7–2.4)

## 2022-09-11 SURGERY — LEFT HEART CATH AND CORONARY ANGIOGRAPHY
Anesthesia: LOCAL

## 2022-09-11 MED ORDER — IOHEXOL 350 MG/ML SOLN
INTRAVENOUS | Status: DC | PRN
  Administered 2022-09-11: 45 mL

## 2022-09-11 MED ORDER — SODIUM CHLORIDE 0.9% FLUSH
3.0000 mL | Freq: Two times a day (BID) | INTRAVENOUS | Status: DC
  Administered 2022-09-11: 3 mL via INTRAVENOUS

## 2022-09-11 MED ORDER — FENTANYL CITRATE (PF) 100 MCG/2ML IJ SOLN
INTRAMUSCULAR | Status: DC | PRN
  Administered 2022-09-11: 25 ug via INTRAVENOUS

## 2022-09-11 MED ORDER — MIDAZOLAM HCL 2 MG/2ML IJ SOLN
INTRAMUSCULAR | Status: AC
  Filled 2022-09-11: qty 2

## 2022-09-11 MED ORDER — SODIUM CHLORIDE 0.9 % IV SOLN: 250.0000 mL | INTRAVENOUS | Status: DC | PRN

## 2022-09-11 MED ORDER — SODIUM CHLORIDE 0.9% FLUSH: 3.0000 mL | INTRAVENOUS | Status: DC | PRN

## 2022-09-11 MED ORDER — MIDAZOLAM HCL 2 MG/2ML IJ SOLN
INTRAMUSCULAR | Status: DC | PRN
  Administered 2022-09-11: 2 mg via INTRAVENOUS

## 2022-09-11 MED ORDER — HEPARIN (PORCINE) 25000 UT/250ML-% IV SOLN
1850.0000 [IU]/h | INTRAVENOUS | Status: AC
  Administered 2022-09-11: 1700 [IU]/h via INTRAVENOUS
  Administered 2022-09-12: 1850 [IU]/h via INTRAVENOUS
  Filled 2022-09-11 (×2): qty 250

## 2022-09-11 MED ORDER — HEPARIN (PORCINE) IN NACL 1000-0.9 UT/500ML-% IV SOLN
INTRAVENOUS | Status: AC
  Filled 2022-09-11: qty 500

## 2022-09-11 MED ORDER — HEPARIN (PORCINE) IN NACL 1000-0.9 UT/500ML-% IV SOLN
INTRAVENOUS | Status: DC | PRN
  Administered 2022-09-11 (×4): 500 mL

## 2022-09-11 MED ORDER — LIDOCAINE HCL (PF) 1 % IJ SOLN
INTRAMUSCULAR | Status: DC | PRN
  Administered 2022-09-11: 20 mL via INTRADERMAL

## 2022-09-11 MED ORDER — HYDRALAZINE HCL 20 MG/ML IJ SOLN: 10.0000 mg | INTRAMUSCULAR | Status: AC | PRN

## 2022-09-11 MED ORDER — LIDOCAINE HCL (PF) 1 % IJ SOLN
INTRAMUSCULAR | Status: AC
  Filled 2022-09-11: qty 30

## 2022-09-11 MED ORDER — FENTANYL CITRATE (PF) 100 MCG/2ML IJ SOLN
INTRAMUSCULAR | Status: AC
  Filled 2022-09-11: qty 2

## 2022-09-11 MED ORDER — SODIUM CHLORIDE 0.9% FLUSH
3.0000 mL | Freq: Two times a day (BID) | INTRAVENOUS | Status: DC
  Administered 2022-09-11 – 2022-09-13 (×5): 3 mL via INTRAVENOUS

## 2022-09-11 MED ORDER — CHLORHEXIDINE GLUCONATE CLOTH 2 % EX PADS
6.0000 | MEDICATED_PAD | Freq: Every day | CUTANEOUS | Status: DC
  Administered 2022-09-12: 6 via TOPICAL

## 2022-09-11 MED ORDER — METHOCARBAMOL 1000 MG/10ML IJ SOLN
500.0000 mg | Freq: Three times a day (TID) | INTRAVENOUS | Status: DC | PRN
  Administered 2022-09-11: 500 mg via INTRAVENOUS
  Filled 2022-09-11: qty 5
  Filled 2022-09-11: qty 500

## 2022-09-11 MED ORDER — LABETALOL HCL 5 MG/ML IV SOLN: 10.0000 mg | INTRAVENOUS | Status: AC | PRN

## 2022-09-11 MED ORDER — ASPIRIN 81 MG PO CHEW
81.0000 mg | CHEWABLE_TABLET | ORAL | Status: AC
  Administered 2022-09-11: 81 mg via ORAL
  Filled 2022-09-11: qty 1

## 2022-09-11 MED ORDER — SODIUM CHLORIDE 0.9 % IV SOLN: INTRAVENOUS | Status: DC

## 2022-09-11 MED ORDER — ACETAMINOPHEN 325 MG PO TABS
650.0000 mg | ORAL_TABLET | ORAL | Status: DC | PRN
  Administered 2022-09-12 (×3): 650 mg via ORAL
  Filled 2022-09-11 (×3): qty 2

## 2022-09-11 SURGICAL SUPPLY — 9 items
CATH INFINITI 5FR JL5 (CATHETERS) IMPLANT
CATH INFINITI JR4 5F (CATHETERS) IMPLANT
KIT HEART LEFT (KITS) ×1 IMPLANT
PACK CARDIAC CATHETERIZATION (CUSTOM PROCEDURE TRAY) ×1 IMPLANT
SHEATH PINNACLE 5F 10CM (SHEATH) IMPLANT
SHEATH PROBE COVER 6X72 (BAG) IMPLANT
TRANSDUCER W/STOPCOCK (MISCELLANEOUS) ×1 IMPLANT
TUBING CIL FLEX 10 FLL-RA (TUBING) ×1 IMPLANT
WIRE EMERALD 3MM-J .035X150CM (WIRE) IMPLANT

## 2022-09-11 NOTE — Interval H&P Note (Signed)
Cath Lab Visit (complete for each Cath Lab visit)  Clinical Evaluation Leading to the Procedure:   ACS: Yes.    Non-ACS:    Anginal Classification: CCS IV  Anti-ischemic medical therapy: Minimal Therapy (1 class of medications)  Non-Invasive Test Results: No non-invasive testing performed  Prior CABG: No previous CABG   Recent cardiac arrest   History and Physical Interval Note:  09/11/2022 9:26 AM  Albert Eaton  has presented today for surgery, with the diagnosis of CHEST PAIN.  The various methods of treatment have been discussed with the patient and family. After consideration of risks, benefits and other options for treatment, the patient has consented to  Procedure(s): LEFT HEART CATH AND CORONARY ANGIOGRAPHY (N/A) as a surgical intervention.  The patient's history has been reviewed, patient examined, no change in status, stable for surgery.  I have reviewed the patient's chart and labs.  Questions were answered to the patient's satisfaction.     Larae Grooms

## 2022-09-11 NOTE — Progress Notes (Signed)
PROGRESS NOTE    Albert Eaton  PXT:[id number] DOB: 11/20/1965 DOA: 09/08/2022 PCP: [name], NP  Chief Complaint  Patient presents with   Cardiac Arrest    Brief Narrative:  Albert Eaton is Albert Eaton 56 y.o. male with medical history significant of ESRD on HD MWF, chronic HFpEF, severe MR status post mitral valve repair October 2023, PAF on amiodarone and eliquis, OSA not tolerating CPAP, hypothyroidism, HTN, HLD who presented to the ED 11/13 after cardiac arrest during dialysis. ROSC achieved   Patient does not recall any of the event during [facility] and HD center.  He reported he had Albert Eaton Picking normal day yesterday and felt normal this morning before going to the HD center.  Half way into the HD process after removing about 2 L of fluid, patient became unresponsive with Albert Eaton Hogan GCS= 3, HD staff could not feel his radial pulses/carotid pulses and [facility] was called.  Monitor showed however patient had SVT versus Callan Norden-fib.  CPR started right away and 2 rounds of epinephrine given and 1 dose of calcium gluconate pushed, then patient recovered pulses and consciousness.  During the episode, patient was also found to have agonal breathing 4-5 times Anique Beckley minute.  No seizure-like activity reported, no loss control of urine or bowel movement or tongue biting during the episode.  Patient reported that he has mild asthma exacerbation and has been using albuterol nebulizer daily, and confirmed is been taking amiodarone for Ford Peddie-fib.   ED Course: Patient was found normotensive and none tachycardia not hypoxic.  CT angiogram showed multiple fractured ribs and small bilateral pleural effusion otherwise no significant abnormalities.  EKG showed normal sinus rhythm with borderline prolonged QTc.  Troponin trending 50> 600.  Lactic acid 5.0> 2.4.  Patient was evaluated by PCCM, recommended admission to progressive care under hospitalist service.    Plan is pain control for rib fractures and cardiac catheterization to evaluate  etiology of cardiac arrest.   Assessment & Plan:   Principal Problem:   Cardiac arrest [facility])   Assessment and Plan: Cardiac arrest: Unclear rhythm, though no fibrillation noted, ?PEA.  - Discussed with cardiology who has conferred with CT surgery. To r/o LCx compression with MVR, will pursue cardiac catheterization.   - s/p LHC -> no change in coronary anatomy since Oct 2023, recommended consider EP referral for consideration of AICD - Dr. Ali Lowe messaged me after cath, no plan for cardiac MR, suspect PEA arrest related to electrolyte shifts with dialysis. They've placed ziopatch.     Acute metabolic encephalopathy: -Initial GCS=3, secondary to cardiopulmonary arrest, mentation fully recovered to baseline after CPR.   Elevated troponins -Likely from arrest/CPR, trend troponins, repeat EKG. Cath as above   Acute asthmatic exacerbation: Given steroids on admission. There is no wheezing currently. Steroids were not reordered at admission and will not be reordered at this time.  - Continue bronchodilators   Bilateral multiple ribs fracture: Due to CPR.  - Pain control, incentive spirometry. At high risk for atelectasis from splinting.  - suspect his CP is related to msk pain  - mobilize, PT?OT   PAF - Continue amiodarone, metoprolol, eliquis.   Acute on chronic normocytic anemia -Decrease of hemoglobin level - repeat H&H, type and screen   ESRD on HD  hyperkalemia -dialysis per renal   Acute on chronic HFpEF - volume per renal    HIV - Continue Biktarvy   OSA - Not tolerating CPAP due to severe claustrophobia.     DVT  prophylaxis: heparin gtt Code Status: full Family Communication: none Disposition:   Status is: Inpatient Remains inpatient appropriate because: pending LHC   Consultants:  Cardiology renal  Procedures:  Pending LHC  Antimicrobials:  Anti-infectives (From admission, onward)    Start     Dose/Rate Route Frequency Ordered Stop   09/08/22  1757  bictegravir-emtricitabine-tenofovir AF (BIKTARVY) 50-200-25 MG per tablet 1 tablet        1 tablet Oral Daily 09/08/22 1554         Subjective: Denies any complaints  Objective: Vitals:   09/10/22 1200 09/10/22 1225 09/10/22 1341 09/10/22 1416  BP: 117/84 126/88 127/82 130/88  Pulse: 74 79 76 77  Resp: 12 (!) 25 18   Temp:  98.7 F (37.1 C) 98.6 F (37 C)   TempSrc:  Axillary Oral   SpO2: 100% 100% 100%   Weight:  91.7 kg    Height:        Intake/Output Summary (Last 24 hours) at 09/10/2022 1442 Last data filed at 09/10/2022 1225 Gross per 24 hour  Intake 3 ml  Output 4 ml  Net -1 ml   Filed Weights   09/09/22 0352 09/10/22 0428 09/10/22 1225  Weight: 92.8 kg 94.3 kg 91.7 kg    Examination:  General: No acute distress. Cardiovascular: RRR Lungs: unlabored Abdomen: Soft, nontender, nondistended Neurological: Alert and oriented 3. Moves all extremities 4 with equal strength. Cranial nerves II through XII grossly intact. Extremities: No clubbing or cyanosis. No edema.  Data Reviewed: I have personally reviewed following labs and imaging studies  CBC: Recent Labs  Lab 09/08/22 1138 09/08/22 1147 09/08/22 1627 09/09/22 0147 09/10/22 0242  WBC 9.3  --   --  9.4 11.1*  NEUTROABS 5.2  --   --   --   --   HGB 7.7* 8.2* 8.0* 7.6* 7.2*  HCT 24.2* 24.0* 24.6* 23.6* 21.9*  MCV 100.0  --   --  97.5 98.2  PLT 269  --   --  323 831    Basic Metabolic Panel: Recent Labs  Lab 09/08/22 1138 09/08/22 1147 09/09/22 0147 09/10/22 0242  NA 141 139 136 133*  K 3.5 3.5 5.1 5.7*  CL 102 104 100 93*  CO2 24  --  23 24  GLUCOSE 158* 154* 131* 114*  BUN 19 21* 31* 56*  CREATININE 6.67* 7.50* 8.74* 11.09*  CALCIUM 10.0  --  10.0 9.0  MG 1.7  --   --   --     GFR: Estimated Creatinine Clearance: 8.5 mL/min (Taimur Fier) (by C-G formula based on SCr of 11.09 mg/dL (H)).  Liver Function Tests: Recent Labs  Lab 09/08/22 1138 09/09/22 0147  AST 80* 57*  ALT 57*  49*  ALKPHOS 88 85  BILITOT 0.2* 0.2*  PROT 6.6 7.1  ALBUMIN 3.0* 3.1*    CBG: Recent Labs  Lab 09/08/22 1215  GLUCAP 107*     No results found for this or any previous visit (from the past 240 hour(s)).       Radiology Studies: No results found.      Scheduled Meds:  amiodarone  200 mg Oral Daily   amLODipine  10 mg Oral QPC lunch   bictegravir-emtricitabine-tenofovir AF  1 tablet Oral Daily   budesonide (PULMICORT) nebulizer solution  0.25 mg Nebulization BID   calcitRIOL  1.75 mcg Oral Q M,W,F-HD   cinacalcet  60 mg Oral Q M,W,F   famotidine  20 mg Oral Daily   ferric  citrate  210 mg Oral With snacks   ferric citrate  420 mg Oral TID WC   heparin sodium (porcine)       levothyroxine  25 mcg Oral Q0600   lidocaine  1 patch Transdermal Q24H   loratadine  10 mg Oral Daily   lubiprostone  24 mcg Oral BID WC   metoprolol tartrate  25 mg Oral BID   mometasone-formoterol  2 puff Inhalation BID   oxyCODONE  5 mg Oral Q6H   pantoprazole  40 mg Oral Daily   rOPINIRole  1 mg Oral QHS   rosuvastatin  10 mg Oral Daily   sodium chloride flush  3 mL Intravenous Q12H   Continuous Infusions:  sodium chloride     heparin 1,400 Units/hr (09/10/22 1429)     LOS: 2 days    Time spent: over 30 min    Fayrene Helper, MD [facility]   To contact the attending provider between 7A-7P or the covering provider during after hours 7P-7A, please log into the web site [email or web address] and access using universal [place] password for that web site. If you do not have the password, please call the hospital operator.  09/10/2022, 2:42 PM

## 2022-09-11 NOTE — Progress Notes (Addendum)
[facility] Progress Note   Subjective:  Seen in room. Had cardiac cath today -says no new lesions. Chest sore but getting better.  No dyspnea.    Objective Vitals:   09/11/22 1040 09/11/22 1047 09/11/22 1050 09/11/22 1125  BP: (!) 177/72 (!) 171/78 (!) 166/78 (!) 169/91  Pulse: 77 78 77   Resp: 13 16 14 18   Temp:    98.3 F (36.8 C)  TempSrc:    Oral  SpO2: 99% 98% 100% 99%  Weight:      Height:          Additional Objective Labs: Basic Metabolic Panel: Recent Labs  Lab 09/09/22 0147 09/10/22 0242 09/11/22 0211  NA 136 133* 136  K 5.1 5.7* 4.5  CL 100 93* 94*  CO2 23 24 25   GLUCOSE 131* 114* 111*  BUN 31* 56* 39*  CREATININE 8.74* 11.09* 8.10*  CALCIUM 10.0 9.0 8.1*  PHOS  --   --  4.1    CBC: Recent Labs  Lab 09/08/22 1138 09/08/22 1147 09/09/22 0147 09/10/22 0242 09/10/22 1550 09/11/22 0211  WBC 9.3  --  9.4 11.1*  --  8.2  NEUTROABS 5.2  --   --   --   --   --   HGB 7.7*   < > 7.6* 7.2* 7.5* 7.5*  HCT 24.2*   < > 23.6* 21.9* 23.1* 23.6*  MCV 100.0  --  97.5 98.2  --  99.6  PLT 269  --  323 291  --  357   < > = values in this interval not displayed.    Blood Culture    Component Value Date/Time   SDES  08/29/2022 1635    BLOOD RIGHT FOREARM Performed at [facility], [place] [address] [place], [place] [address]    [facility]  08/29/2022 1635    Blood Culture adequate volume BOTTLES DRAWN AEROBIC AND ANAEROBIC Performed at [facility], [address] [place], [place] [address]    CULT  08/29/2022 1635    NO GROWTH 5 DAYS Performed at [place] [address] [place], [place] [address]    REPTSTATUS 09/03/2022 FINAL 08/29/2022 1635     Physical Exam General: Alert, nad  Heart: RRR No m,r,g  Lungs: Clear bilaterally  Abdomen: soft non-tender Extremities: No sig LE edema Dialysis Access: L AVF +bruit   Medications:  sodium chloride     sodium chloride     heparin       amiodarone  200 mg Oral Daily   amLODipine  10 mg Oral QPC lunch   bictegravir-emtricitabine-tenofovir AF  1 tablet Oral Daily   budesonide (PULMICORT) nebulizer solution  0.25 mg Nebulization BID   calcitRIOL  1.75 mcg Oral Q M,W,F-HD   cinacalcet  60 mg Oral Q M,W,F   famotidine  20 mg Oral Daily   ferric citrate  210 mg Oral With snacks   ferric citrate  420 mg Oral TID WC   levothyroxine  25 mcg Oral Q0600   lidocaine  1 patch Transdermal Q24H   loratadine  10 mg Oral Daily   lubiprostone  24 mcg Oral BID WC   metoprolol tartrate  25 mg Oral BID   mometasone-formoterol  2 puff Inhalation BID   pantoprazole  40 mg Oral Daily   rOPINIRole  1 mg Oral QHS   rosuvastatin  10 mg Oral Daily   sodium chloride flush  3 mL Intravenous Q12H   sodium  chloride flush  3 mL Intravenous Q12H    Dialysis Orders:  [place] MWF 4h  400/800 EDW 87.1 kg 3K/2.5Ca AVF Heparin 3000  -Mircera 200 q 2 wks (last 11/10) -Calcitriol 1.75 TIW -Sensipar 30 TIW    Assessment/Plan: Cardiac arrest during dialysis 11/13.  Required 2 mins CPR. Unclear etiology. Per cardiology -no new findings on cardiac cath today.  ESRD -  HD MWF. Continue on schedule next HD Friday  Hypertension/volume  - BP acceptable. Weights up.  Continue UF to EDW Anemia  - Recent ESA dose as outpatient. Hb trending down. Tsat 26%  Transfuse prn.  Metabolic bone disease -  Continue home binders/Sensipar  Afib - new onset. On amiodarone. Eliquis held. Heparin per pharmacy  Nutrition - Renal diet with fluid restriction  Hepatitis B - dialysis in isolation room  S/p recent MVR Rib fractures s/p CPR     Albert Eaton Albert Earthly PA-C [facility] 09/11/2022,1:18 PM

## 2022-09-11 NOTE — CV Procedure (Signed)
SITE AREA: FRA  SITE PRIOR TO REMOVAL:  LEVEL 0  PRESSURE APPLIED FOR: 20 minutes  MANUAL: yes  PATIENT STATUS DURING PULL: A&OX4  POST PULL SITE:  LEVEL 0  POST PULL INSTRUCTIONS GIVEN: yes  POST PULL PULSES PRESENT: yes  DRESSING APPLIED: [place] @  1045  COMMENTS: [place] pulled by Wynelle Link RN from [facility]

## 2022-09-11 NOTE — Progress Notes (Signed)
3:30- pt c/o being awakened by "15/10" CP. 1 mg dilaudid given, pain decreased to 6/10. When rounding on pt ~ 4:30 c/o 10/10 pain. EKGs obtained but tremors were noted in leads VI & VII, 5 mg IR oxycodone given, on call cardiologist, Gwynneth Aliment, MD, paged & return call received. Cath scheduled for 9 AM today. Per return call from [place], MD, day shift cardiology team to follow up this AM.

## 2022-09-11 NOTE — Progress Notes (Addendum)
Rounding Note    Patient Name: Albert Eaton Date of Encounter: 09/11/2022  [place] Cardiologist: [name], MD   Subjective   No complaints today, ready for heart cath  Inpatient Medications    Scheduled Meds:  amiodarone  200 mg Oral Daily   amLODipine  10 mg Oral QPC lunch   bictegravir-emtricitabine-tenofovir AF  1 tablet Oral Daily   budesonide (PULMICORT) nebulizer solution  0.25 mg Nebulization BID   calcitRIOL  1.75 mcg Oral Q M,W,F-HD   cinacalcet  60 mg Oral Q M,W,F   famotidine  20 mg Oral Daily   ferric citrate  210 mg Oral With snacks   ferric citrate  420 mg Oral TID WC   levothyroxine  25 mcg Oral Q0600   lidocaine  1 patch Transdermal Q24H   loratadine  10 mg Oral Daily   lubiprostone  24 mcg Oral BID WC   metoprolol tartrate  25 mg Oral BID   mometasone-formoterol  2 puff Inhalation BID   pantoprazole  40 mg Oral Daily   rOPINIRole  1 mg Oral QHS   rosuvastatin  10 mg Oral Daily   sodium chloride flush  3 mL Intravenous Q12H   sodium chloride flush  3 mL Intravenous Q12H   Continuous Infusions:  sodium chloride     sodium chloride     sodium chloride     heparin 1,700 Units/hr (09/11/22 0716)   PRN Meds: sodium chloride, sodium chloride, acetaminophen, azelastine, gabapentin, HYDROmorphone (DILAUDID) injection, levalbuterol, metoCLOPramide (REGLAN) injection, oxyCODONE, sodium chloride flush, sodium chloride flush   Vital Signs    Vitals:   09/10/22 1936 09/10/22 2011 09/10/22 2145 09/11/22 0400  BP:  111/72 109/80 114/82  Pulse:  77 78 80  Resp:  17  18  Temp:  98.6 F (37 C)  98 F (36.7 C)  TempSrc:  Oral  Oral  SpO2: 98% 98%  97%  Weight:    93.3 kg  Height:        Intake/Output Summary (Last 24 hours) at 09/11/2022 0810 Last data filed at 09/11/2022 0309 Gross per 24 hour  Intake 419.77 ml  Output 4 ml  Net 415.77 ml      09/11/2022    4:00 AM 09/10/2022   12:25 PM 09/10/2022    4:28 AM  Last 3  Weights  Weight (lbs) 205 lb 11 oz 202 lb 2.6 oz 208 lb  Weight (kg) 93.3 kg 91.7 kg 94.348 kg      Telemetry    Sinus rhythm in the 60s, brief burst of narrow complex tachycardia with p waves present - Personally Reviewed  ECG    SR HR 80 - Personally Reviewed  Physical Exam   GEN: No acute distress.   Neck: No JVD Cardiac: RRR, no murmurs, rubs, or gallops.  Respiratory: Clear to auscultation bilaterally. GI: Soft, nontender, non-distended  MS: No edema; No deformity. Neuro:  Nonfocal  Psych: Normal affect   Labs    High Sensitivity Troponin:   Recent Labs  Lab 08/29/22 1505 08/29/22 1645 09/08/22 1138 09/08/22 1455 09/09/22 0502  TROPONINIHS 160* 148* 54* 606* 582*     Chemistry Recent Labs  Lab 09/08/22 1138 09/08/22 1147 09/09/22 0147 09/10/22 0242 09/11/22 0211  NA 141   < > 136 133* 136  K 3.5   < > 5.1 5.7* 4.5  CL 102   < > 100 93* 94*  CO2 24  --  23 24 25  GLUCOSE 158*   < > 131* 114* 111*  BUN 19   < > 31* 56* 39*  CREATININE 6.67*   < > 8.74* 11.09* 8.10*  CALCIUM 10.0  --  10.0 9.0 8.1*  MG 1.7  --   --   --  1.8  PROT 6.6  --  7.1  --  7.2  ALBUMIN 3.0*  --  3.1*  --  3.2*  AST 80*  --  57*  --  20  ALT 57*  --  49*  --  31  ALKPHOS 88  --  85  --  90  BILITOT 0.2*  --  0.2*  --  0.3  GFRNONAA 9*  --  7* 5* 7*  ANIONGAP 15  --  13 16* 17*   < > = values in this interval not displayed.    Lipids No results for input(s): "CHOL", "TRIG", "HDL", "LABVLDL", "LDLCALC", "CHOLHDL" in the last 168 hours.  Hematology Recent Labs  Lab 09/09/22 0147 09/10/22 0242 09/10/22 1550 09/11/22 0211  WBC 9.4 11.1*  --  8.2  RBC 2.42* 2.23*  --  2.37*  HGB 7.6* 7.2* 7.5* 7.5*  HCT 23.6* 21.9* 23.1* 23.6*  MCV 97.5 98.2  --  99.6  MCH 31.4 32.3  --  31.6  MCHC 32.2 32.9  --  31.8  RDW 16.0* 16.6*  --  16.3*  PLT 323 291  --  357   Thyroid  Recent Labs  Lab 09/08/22 1205  TSH 2.868    BNP Recent Labs  Lab 09/08/22 1205  BNP 2,787.4*     DDimer No results for input(s): "DDIMER" in the last 168 hours.   Radiology    ECHOCARDIOGRAM LIMITED  Result Date: 09/10/2022    ECHOCARDIOGRAM REPORT   Patient Name:   Albert Eaton [facility] Date of Exam: 09/09/2022 Medical Rec #:  [id number]       Height:       70.0 in Accession #:    [id number]      Weight:       204.6 lb Date of Birth:  Nov 02, 1965        BSA:          2.108 m Patient Age:    56 years        BP:           140/96 mmHg Patient Gender: M               HR:           75 bpm. Exam Location:  Inpatient Procedure: 2D Echo, Limited Echo, Cardiac Doppler, Limited Color Doppler and            Intracardiac Opacification Agent Indications:     Cardiac arrest I46.9  History:         Patient has prior history of Echocardiogram examinations, most                  recent 08/22/2022. NSTEMI, Abnormal ECG, CVA, Mitral Valve                  Disease, Arrythmias:Cardiac Arrest, Signs/Symptoms:Chest Pain                  and Shortness of Breath; Risk Factors:Hypertension, Sleep                  Apnea, Dyslipidemia and Former Smoker.  Mitral Valve: 30 mm Medtronic prosthetic annuloplasty ring                  valve is present in the mitral position. Procedure Date:                  08/21/22.  Sonographer:     Greer Pickerel Referring Phys:  [id number] [name] Diagnosing Phys: Buford Dresser MD  Sonographer Comments: Image acquisition challenging due to patient body habitus and Image acquisition challenging due to respiratory motion. IMPRESSIONS  1. Left ventricular ejection fraction, by estimation, is 45 to 50%. The left ventricle has mildly decreased function. The left ventricle demonstrates global hypokinesis. There is moderate concentric left ventricular hypertrophy. Left ventricular diastolic function could not be evaluated.  2. Right ventricular systolic function was not well visualized. The right ventricular size is moderately enlarged.  3. The mitral valve has been repaired/replaced.  Trivial mitral valve regurgitation. There is a 30 mm Medtronic prosthetic annuloplasty ring present in the mitral position. Procedure Date: 08/21/22.  4. The aortic valve is grossly normal. Aortic valve regurgitation is not visualized. Comparison(s): No significant change from prior study. FINDINGS  Left Ventricle: Left ventricular ejection fraction, by estimation, is 45 to 50%. The left ventricle has mildly decreased function. The left ventricle demonstrates global hypokinesis. Definity contrast agent was given IV to delineate the left ventricular  endocardial borders. The left ventricular internal cavity size was normal in size. There is moderate concentric left ventricular hypertrophy. Left ventricular diastolic function could not be evaluated due to mitral valve repair. Left ventricular diastolic function could not be evaluated. Right Ventricle: The right ventricular size is moderately enlarged. Right vetricular wall thickness was not well visualized. Right ventricular systolic function was not well visualized. Left Atrium: Left atrial size was not well visualized. Right Atrium: Right atrial size was not well visualized. Pericardium: There is no evidence of pericardial effusion. Mitral Valve: The mitral valve has been repaired/replaced. Trivial mitral valve regurgitation. There is a 30 mm Medtronic prosthetic annuloplasty ring present in the mitral position. Procedure Date: 08/21/22. Tricuspid Valve: The tricuspid valve is grossly normal. Tricuspid valve regurgitation is trivial. No evidence of tricuspid stenosis. Aortic Valve: The aortic valve is grossly normal. Aortic valve regurgitation is not visualized. Pulmonic Valve: The pulmonic valve was not well visualized. Pulmonic valve regurgitation is not visualized. Aorta: Aortic root could not be assessed and the ascending aorta was not well visualized. IAS/Shunts: The interatrial septum was not well visualized.  LEFT VENTRICLE PLAX 2D LVIDd:         5.10 cm  LVIDs:         4.50 cm LV PW:         1.60 cm LV IVS:        1.20 cm  MITRAL VALVE MV Area (PHT): 2.36 cm MV Decel Time: 321 msec MR Peak grad: 99.8 mmHg MR Mean grad: 67.5 mmHg MR Vmax:      499.50 cm/s MR Vmean:     392.5 cm/s MV E velocity: 170.00 cm/s Buford Dresser MD Electronically signed by Buford Dresser MD Signature Date/Time: 2022/09/29/4:52:05 PM    Final (Updated)     Cardiac Studies   Heart cath today  Echo 09/29/2022: 1. Left ventricular ejection fraction, by estimation, is 45 to 50%. The  left ventricle has mildly decreased function. The left ventricle  demonstrates global hypokinesis. There is moderate concentric left  ventricular hypertrophy. Left ventricular  diastolic function could not be evaluated.  2. Right ventricular systolic function was not well visualized. The right  ventricular size is moderately enlarged.   3. The mitral valve has been repaired/replaced. Trivial mitral valve  regurgitation. There is a 30 mm Medtronic prosthetic annuloplasty ring  present in the mitral position. Procedure Date: 08/21/22.   4. The aortic valve is grossly normal. Aortic valve regurgitation is not  visualized.   Patient Profile     56 y.o. male with history of nonobstructive CAD, renal cell carcinoma s/p left nephrectomy 01/2021, end-stage renal disease on hemodialysis (MWF), hypertension, hyperlipidemia, HFpEF, HIV, atrial tachycardia who recently underwent mitral valve repair 39/0300 >> complicated hospital course by myoclonus, postop atrial fibrillation (treated with amiodarone ) and respiratory arrest requiring reintubation and CVA.  Readmitted 11/3-11/5 for pleuritic chest pain in setting of a flutter RVR.  Placed on Eliquis for anticoagulation.  Now presenting from dialysis sessions for cardiac arrest. He received 2 minutes of CPR with 2 doses of epinephrine and 1 g of calcium given with return of spontaneous circulation.  The monitor showed SVT versus atrial  fibrillation.  His chest x-ray here showed mild edema.     Assessment & Plan    Cardiac arrest at HD Elevated troponin  HS troponin peaked at 606 - suspect demand mismatch in the setting of ES RD and CPR - known obstructive CAD by heart cath 08/01/22 - etiology of arrest is unclear, 1 possibility could be a change in anatomy following recent mitral valve repair that could result in a compression or kinking of the LCx - Planning relook cath today -cath delayed due to Eliquis dosing - depending on heart cath, may opt to proceed with cMRI tomorrow (Friday) - if heart cath looks OK, no cMRI   MR s/p mitral valve repair 08/21/2022 Echo this admission showed trivial MR   Paroxysmal atrial fibrillation/flutter Chronic anticoagulation Telemetry during cardiac arrest and postarrest with SVT versus A-fib RVR On amiodarone 200 mg + Lopressor 25 mg twice daily Eliquis on hold for procedures, continue heparin gtt   Mild cardiomyopathy, grade 2 DD, moderate RV enlargement Echo this admission showed stable LVEF 45-50% with moderate RV enlargement Fluid status managed by HD   Chest wall pain after CPR His description of chest pain is most consistent with MSK given that it is worse with deep inspiration and with movement Could consider trial of Imdur, but for now was managed with opioid pain medications Planning for heart catheterization today       For questions or updates, please contact [place] Please consult [email or web address] for contact info under        Signed, Ledora Bottcher, PA  09/11/2022, 8:10 AM     ATTENDING ATTESTATION:  After conducting a review of all available clinical information with the care team, interviewing the patient, and performing a physical exam, I agree with the findings and plan described in this note.   GEN: No acute distress.   HEENT:  MMM, no JVD, no scleral icterus Cardiac: RRR, no murmurs, rubs, or gallops.  Respiratory: Clear to  auscultation bilaterally. GI: Soft, nontender, non-distended  MS: No edema; No deformity. Neuro:  Nonfocal  Vasc:  +2 radial pulses  Seen after cath.  Coronary angiography with only mild diffuse obstructive disease and no issues with left circumflex.  I did discuss the case with EP.  Given the fact the patient likely had a PEA arrest (again, no shocks administered), CMR may produce coincidental and not causative findings (re: scar would  not cause PEA arrest).  Additionally, high risk of device infection for ICD in patient with ESRD.  I suspect electrolyte shifts during dialysis was the culprit especially given prompt ROSC with calcium and epi.  Will arrange for monitor.  Will sign off, call with questions.  Lenna Sciara, MD Pager [phone]

## 2022-09-11 NOTE — Progress Notes (Signed)
ANTICOAGULATION CONSULT NOTE  Pharmacy Consult for heparin Indication: atrial fibrillation  Allergies  Allergen Reactions   Ace Inhibitors Cough and Other (See Comments)    Patient Measurements: Height: 5' 10"  (177.8 cm) Weight: 93.3 kg (205 lb 11 oz) IBW/kg (Calculated) : 73 Heparin Dosing Weight: 94 kg  Vital Signs: Temp: 98 F (36.7 C) (11/16 0400) Temp Source: Oral (11/16 0400) BP: 166/78 (11/16 1050) Pulse Rate: 77 (11/16 1050)  Labs: Recent Labs    09/08/22 1138 09/08/22 1147 09/08/22 1455 09/08/22 1627 09/09/22 0147 09/09/22 0502 09/10/22 0242 09/10/22 1550 09/10/22 2217 09/11/22 0211  HGB 7.7*   < >  --    < > 7.6*  --  7.2* 7.5*  --  7.5*  HCT 24.2*   < >  --    < > 23.6*  --  21.9* 23.1*  --  23.6*  PLT 269  --   --   --  323  --  291  --   --  357  APTT  --   --   --   --   --   --   --   --  37*  --   LABPROT 16.1*  --   --   --   --   --   --   --   --   --   INR 1.3*  --   --   --   --   --   --   --   --   --   CREATININE 6.67*   < >  --   --  8.74*  --  11.09*  --   --  8.10*  TROPONINIHS 54*  --  606*  --   --  582*  --   --   --   --    < > = values in this interval not displayed.     Estimated Creatinine Clearance: 11.7 mL/min (A) (by C-G formula based on SCr of 8.1 mg/dL (H)).   Medical History: Past Medical History:  Diagnosis Date   Anemia    Aortic atherosclerosis (HCC)    Arthritis    Asthma    Cancer ([place])    renal cyst   Chronic diastolic CHF (congestive heart failure) ([place]) 01/06/2017   Echo 7/16 [facility] in [place], [name]) Mild AI, mild LAE, mild concentric LVH, EF 55, normal wall motion, mild to moderate MR, mild PI, RVSP 55 // Echo 10/09/16 ([facility]):  Moderate LVH, grade 2 diastolic dysfunction, mild MR, moderate LAE    Esophagitis    ESRD (end stage renal disease) on dialysis [facility])    Dialysis Mon Wed Fri   GERD (gastroesophageal reflux disease)    Gout    no current problems   Hepatitis    Hep B   HIV (human  immunodeficiency virus infection) ([place])    HLD (hyperlipidemia)    Hypertension    Hypothyroidism    Mitral regurgitation    Neuropathy    Sleep apnea    uses c-pap   Wears glasses    Wears partial dentures      Assessment: 43 yoM admitted with brief cardiac arrest in HD. Pt on apixaban PTA for hx AFib. Cath planned, will hold further apixaban and start heparin infusion. Last dose of apixaban was 11/14 pm.  Pt s/p LHC with no change in anatomy. Heparin to resume 8h after sheath removal (1045).  Goal of Therapy:  Heparin level 0.3-0.7 units/ml aPTT  66-102 seconds Monitor platelets by anticoagulation protocol: Yes   Plan:  Resume heparin 1700 units/h no bolus at 1845 Recheck aPTT 8h after resuming Daily heparin level and aPTT   Arrie Senate, PharmD, BCPS, [facility] Clinical Pharmacist [phone] Please check AMION for all [place] numbers 09/11/2022

## 2022-09-11 NOTE — Progress Notes (Signed)
Pt receives out-pt HD at [facility] on MWF 7:55 chair time. Will assist as needed.   Melven Sartorius Renal Navigator [phone]

## 2022-09-11 NOTE — Progress Notes (Signed)
ANTICOAGULATION CONSULT NOTE  Pharmacy Consult for heparin Indication: atrial fibrillation  Allergies  Allergen Reactions   Ace Inhibitors Cough and Other (See Comments)    Patient Measurements: Height: 5' 10"  (177.8 cm) Weight: 91.7 kg (202 lb 2.6 oz) IBW/kg (Calculated) : 73 Heparin Dosing Weight: 94 kg  Vital Signs: Temp: 98.6 F (37 C) (11/15 2011) Temp Source: Oral (11/15 2011) BP: 109/80 (11/15 2145) Pulse Rate: 78 (11/15 2145)  Labs: Recent Labs    09/08/22 1138 09/08/22 1147 09/08/22 1455 09/08/22 1627 09/09/22 0147 09/09/22 0502 09/10/22 0242 09/10/22 1550 09/10/22 2217  HGB 7.7* 8.2*  --    < > 7.6*  --  7.2* 7.5*  --   HCT 24.2* 24.0*  --    < > 23.6*  --  21.9* 23.1*  --   PLT 269  --   --   --  323  --  291  --   --   APTT  --   --   --   --   --   --   --   --  37*  LABPROT 16.1*  --   --   --   --   --   --   --   --   INR 1.3*  --   --   --   --   --   --   --   --   CREATININE 6.67* 7.50*  --   --  8.74*  --  11.09*  --   --   TROPONINIHS 54*  --  606*  --   --  582*  --   --   --    < > = values in this interval not displayed.     Estimated Creatinine Clearance: 8.5 mL/min (A) (by C-G formula based on SCr of 11.09 mg/dL (H)).   Medical History: Past Medical History:  Diagnosis Date   Anemia    Aortic atherosclerosis (HCC)    Arthritis    Asthma    Cancer ([place])    renal cyst   Chronic diastolic CHF (congestive heart failure) ([place]) 01/06/2017   Echo 7/16 [facility] in [place], [name]) Mild AI, mild LAE, mild concentric LVH, EF 55, normal wall motion, mild to moderate MR, mild PI, RVSP 55 // Echo 10/09/16 ([facility]):  Moderate LVH, grade 2 diastolic dysfunction, mild MR, moderate LAE    Esophagitis    ESRD (end stage renal disease) on dialysis [facility])    Dialysis Mon Wed Fri   GERD (gastroesophageal reflux disease)    Gout    no current problems   Hepatitis    Hep B   HIV (human immunodeficiency virus infection) ([place])    HLD  (hyperlipidemia)    Hypertension    Hypothyroidism    Mitral regurgitation    Neuropathy    Sleep apnea    uses c-pap   Wears glasses    Wears partial dentures      Assessment: 29 yoM admitted with brief cardiac arrest in HD. Pt on apixaban PTA for hx AFib. Cath planned, will hold further apixaban and start heparin infusion. Last dose of apixaban was 11/14 2136.  11/16 AM update:  aPTT low  Goal of Therapy:  Heparin level 0.3-0.7 units/ml aPTT 66-102 seconds Monitor platelets by anticoagulation protocol: Yes   Plan:  Inc heparin to 1700 units/hr Re-check heparin level and aPTT in 8 hours  Narda Bonds, PharmD, [place] Pharmacist Phone: [phone]

## 2022-09-12 DIAGNOSIS — I469 Cardiac arrest, cause unspecified: Secondary | ICD-10-CM | POA: Diagnosis not present

## 2022-09-12 LAB — COMPREHENSIVE METABOLIC PANEL
ALT: 21 U/L (ref 0–44)
AST: 11 U/L — ABNORMAL LOW (ref 15–41)
Albumin: 2.8 g/dL — ABNORMAL LOW (ref 3.5–5.0)
Alkaline Phosphatase: 79 U/L (ref 38–126)
Anion gap: 15 (ref 5–15)
BUN: 58 mg/dL — ABNORMAL HIGH (ref 6–20)
CO2: 23 mmol/L (ref 22–32)
Calcium: 8.1 mg/dL — ABNORMAL LOW (ref 8.9–10.3)
Chloride: 96 mmol/L — ABNORMAL LOW (ref 98–111)
Creatinine, Ser: 10.56 mg/dL — ABNORMAL HIGH (ref 0.61–1.24)
GFR, Estimated: 5 mL/min — ABNORMAL LOW (ref >60)
Glucose, Bld: 111 mg/dL — ABNORMAL HIGH (ref 70–99)
Potassium: 4.7 mmol/L (ref 3.5–5.1)
Sodium: 134 mmol/L — ABNORMAL LOW (ref 135–145)
Total Bilirubin: 0.4 mg/dL (ref 0.3–1.2)
Total Protein: 6.2 g/dL — ABNORMAL LOW (ref 6.5–8.1)

## 2022-09-12 LAB — CBC WITH DIFFERENTIAL/PLATELET
Abs Immature Granulocytes: 0.06 10*3/uL (ref 0.00–0.07)
Basophils Absolute: 0 10*3/uL (ref 0.0–0.1)
Basophils Relative: 1 %
Eosinophils Absolute: 0.2 10*3/uL (ref 0.0–0.5)
Eosinophils Relative: 2 %
HCT: 21.9 % — ABNORMAL LOW (ref 39.0–52.0)
Hemoglobin: 7.3 g/dL — ABNORMAL LOW (ref 13.0–17.0)
Immature Granulocytes: 1 %
Lymphocytes Relative: 14 %
Lymphs Abs: 1.1 10*3/uL (ref 0.7–4.0)
MCH: 31.9 pg (ref 26.0–34.0)
MCHC: 33.3 g/dL (ref 30.0–36.0)
MCV: 95.6 fL (ref 80.0–100.0)
Monocytes Absolute: 0.9 10*3/uL (ref 0.1–1.0)
Monocytes Relative: 12 %
Neutro Abs: 5.7 10*3/uL (ref 1.7–7.7)
Neutrophils Relative %: 70 %
Platelets: 330 10*3/uL (ref 150–400)
RBC: 2.29 MIL/uL — ABNORMAL LOW (ref 4.22–5.81)
RDW: 15.9 % — ABNORMAL HIGH (ref 11.5–15.5)
WBC: 8 10*3/uL (ref 4.0–10.5)
nRBC: 0.3 % — ABNORMAL HIGH (ref 0.0–0.2)

## 2022-09-12 LAB — MAGNESIUM: Magnesium: 1.8 mg/dL (ref 1.7–2.4)

## 2022-09-12 LAB — HEPARIN LEVEL (UNFRACTIONATED): Heparin Unfractionated: >1.1 IU/mL — ABNORMAL HIGH (ref 0.30–0.70)

## 2022-09-12 LAB — PHOSPHORUS: Phosphorus: 4.6 mg/dL (ref 2.5–4.6)

## 2022-09-12 LAB — APTT: aPTT: 58 seconds — ABNORMAL HIGH (ref 24–36)

## 2022-09-12 MED ORDER — ACETAMINOPHEN 325 MG PO TABS: 650.0000 mg | ORAL_TABLET | Freq: Four times a day (QID) | ORAL | Status: DC | PRN

## 2022-09-12 MED ORDER — APIXABAN 5 MG PO TABS
5.0000 mg | ORAL_TABLET | Freq: Two times a day (BID) | ORAL | Status: DC
  Administered 2022-09-12 – 2022-09-14 (×5): 5 mg via ORAL
  Filled 2022-09-12 (×6): qty 1

## 2022-09-12 MED ORDER — ACETAMINOPHEN 500 MG PO TABS
1000.0000 mg | ORAL_TABLET | Freq: Three times a day (TID) | ORAL | Status: DC
  Administered 2022-09-12 – 2022-09-14 (×7): 1000 mg via ORAL
  Filled 2022-09-12 (×7): qty 2

## 2022-09-12 MED ORDER — DICLOFENAC SODIUM 1 % EX GEL
2.0000 g | Freq: Four times a day (QID) | CUTANEOUS | Status: DC
  Administered 2022-09-12 – 2022-09-13 (×6): 2 g via TOPICAL
  Filled 2022-09-12: qty 100

## 2022-09-12 MED ORDER — HYDROMORPHONE HCL 1 MG/ML IJ SOLN
0.5000 mg | INTRAMUSCULAR | Status: AC | PRN
  Administered 2022-09-12 – 2022-09-13 (×2): 0.5 mg via INTRAVENOUS
  Filled 2022-09-12 (×2): qty 1

## 2022-09-12 NOTE — Progress Notes (Signed)
PROGRESS NOTE    [name]  SNK:[id number] DOB: Apr 19, 1966 DOA: 09/08/2022 PCP: [name], NP  Chief Complaint  Patient presents with   Cardiac Arrest    Brief Narrative:  Albert Eaton is Albert Eaton 56 y.o. male with medical history significant of ESRD on HD MWF, chronic HFpEF, severe MR status post mitral valve repair October 2023, PAF on amiodarone and eliquis, OSA not tolerating CPAP, hypothyroidism, HTN, HLD who presented to the ED 11/13 after cardiac arrest during dialysis. ROSC achieved   Patient does not recall any of the event during [facility] and HD center.  He reported he had Tatumn Corbridge normal day yesterday and felt normal this morning before going to the HD center.  Half way into the HD process after removing about 2 L of fluid, patient became unresponsive with Davonne Baby GCS= 3, HD staff could not feel his radial pulses/carotid pulses and [facility] was called.  Monitor showed however patient had SVT versus Deanie Jupiter-fib.  CPR started right away and 2 rounds of epinephrine given and 1 dose of calcium gluconate pushed, then patient recovered pulses and consciousness.  During the episode, patient was also found to have agonal breathing 4-5 times Haliyah Fryman minute.  No seizure-like activity reported, no loss control of urine or bowel movement or tongue biting during the episode.  Patient reported that he has mild asthma exacerbation and has been using albuterol nebulizer daily, and confirmed is been taking amiodarone for Berle Fitz-fib.   ED Course: Patient was found normotensive and none tachycardia not hypoxic.  CT angiogram showed multiple fractured ribs and small bilateral pleural effusion otherwise no significant abnormalities.  EKG showed normal sinus rhythm with borderline prolonged QTc.  Troponin trending 50> 600.  Lactic acid 5.0> 2.4.  Patient was evaluated by PCCM, recommended admission to progressive care under hospitalist service.    He was admitted and had LHC which showed no change in coronary anatomy.  They  recommended consideration of EP referral, but Dr. Ali Lowe recommending ziopatch and outpatient follow up at this time with suspicion that PEA arrest is related to electrolyte shifts with dialysis.  Discharge pending improvement in pain control.  Assessment & Plan:   Principal Problem:   Cardiac arrest [facility])   Assessment and Plan: Cardiac arrest: Unclear rhythm, though no fibrillation noted, ?PEA.  - Discussed with cardiology who has conferred with CT surgery. To r/o LCx compression with MVR, will pursue cardiac catheterization.   - s/p LHC -> no change in coronary anatomy since Oct 2023, recommended consider EP referral for consideration of AICD - Dr. Ali Lowe messaged me after cath, no plan for cardiac MR, suspect PEA arrest related to electrolyte shifts with dialysis. They've placed ziopatch.     Bilateral multiple ribs fracture: Due to CPR.  - Pain control, incentive spirometry. At high risk for atelectasis from splinting.  - suspect his CP is related to msk pain  - mobilize, PT/OT - continued severe pain requiring intermittent IV pain meds.  Schedule APAP, oxycodone prn.  Lidocaine patch, voltaren prn.  Try to limit IV pain meds.   Acute metabolic encephalopathy: -Initial GCS=3, secondary to cardiopulmonary arrest, mentation fully recovered to baseline after CPR.   Elevated troponins -Likely from arrest/CPR, trend troponins, repeat EKG. Cath as above   Acute asthmatic exacerbation: Given steroids on admission. There is no wheezing currently. Steroids were not reordered at admission and will not be reordered at this time.  - Continue bronchodilators   PAF - Continue amiodarone, metoprolol, eliquis.  Acute on chronic normocytic anemia -Decrease of hemoglobin level - repeat H&H, type and screen   ESRD on HD  hyperkalemia -dialysis per renal   Acute on chronic HFpEF - volume per renal    HIV - Continue Biktarvy   OSA - Not tolerating CPAP due to severe  claustrophobia.     DVT prophylaxis: heparin gtt Code Status: full Family Communication: none Disposition:   Status is: Inpatient Remains inpatient appropriate because: pending LHC   Consultants:  Cardiology renal  Procedures:  LHC   Mid LAD lesion is 50% stenosed.   Prox LAD lesion is 50% stenosed.   Prox RCA lesion is 40% stenosed.   2nd Mrg lesion is 30% stenosed.   LV end diastolic pressure is mildly elevated.   There is no aortic valve stenosis.   No change in coronary anatomy since October 2023.  Consider EP referral for consideration of AICD.      Antimicrobials:  Anti-infectives (From admission, onward)    Start     Dose/Rate Route Frequency Ordered Stop   09/08/22 1757  bictegravir-emtricitabine-tenofovir AF (BIKTARVY) 50-200-25 MG per tablet 1 tablet        1 tablet Oral Daily 09/08/22 1554         Subjective: No complaints  Objective: Vitals:   09/12/22 1100 09/12/22 1130 09/12/22 1328 09/12/22 1656  BP: (!) 138/118 (!) 160/76 (!) 143/88 (!) 155/86  Pulse: 73 72 78 83  Resp: 14 18 16 17   Temp:   97.8 F (36.6 C) 97.9 F (36.6 C)  TempSrc:    Oral  SpO2: 99% 99% 98% 98%  Weight:      Height:        Intake/Output Summary (Last 24 hours) at 09/12/2022 1908 Last data filed at 09/12/2022 1328 Gross per 24 hour  Intake 240 ml  Output 4000 ml  Net -3760 ml   Filed Weights   09/10/22 1225 09/11/22 0400 09/12/22 0539  Weight: 91.7 kg 93.3 kg 88.9 kg    Examination:  General: No acute distress. Cardiovascular: RRR Lungs: unlabored Abdomen: Soft, nontender, nondistended Neurological: Alert and oriented 3. Moves all extremities 4 with equal strength. Cranial nerves II through XII grossly intact. Extremities: No clubbing or cyanosis. No edema.   Data Reviewed: I have personally reviewed following labs and imaging studies  CBC: Recent Labs  Lab 09/08/22 1138 09/08/22 1147 09/09/22 0147 09/10/22 0242 09/10/22 1550 09/11/22 0211  09/12/22 0452  WBC 9.3  --  9.4 11.1*  --  8.2 8.0  NEUTROABS 5.2  --   --   --   --   --  5.7  HGB 7.7*   < > 7.6* 7.2* 7.5* 7.5* 7.3*  HCT 24.2*   < > 23.6* 21.9* 23.1* 23.6* 21.9*  MCV 100.0  --  97.5 98.2  --  99.6 95.6  PLT 269  --  323 291  --  357 330   < > = values in this interval not displayed.    Basic Metabolic Panel: Recent Labs  Lab 09/08/22 1138 09/08/22 1147 09/09/22 0147 09/10/22 0242 09/11/22 0211 09/12/22 0452  NA 141 139 136 133* 136 134*  K 3.5 3.5 5.1 5.7* 4.5 4.7  CL 102 104 100 93* 94* 96*  CO2 24  --  23 24 25 23   GLUCOSE 158* 154* 131* 114* 111* 111*  BUN 19 21* 31* 56* 39* 58*  CREATININE 6.67* 7.50* 8.74* 11.09* 8.10* 10.56*  CALCIUM 10.0  --  10.0  9.0 8.1* 8.1*  MG 1.7  --   --   --  1.8 1.8  PHOS  --   --   --   --  4.1 4.6    GFR: Estimated Creatinine Clearance: 8.8 mL/min (Donnita Farina) (by C-G formula based on SCr of 10.56 mg/dL (H)).  Liver Function Tests: Recent Labs  Lab 09/08/22 1138 09/09/22 0147 09/11/22 0211 09/12/22 0452  AST 80* 57* 20 11*  ALT 57* 49* 31 21  ALKPHOS 88 85 90 79  BILITOT 0.2* 0.2* 0.3 0.4  PROT 6.6 7.1 7.2 6.2*  ALBUMIN 3.0* 3.1* 3.2* 2.8*    CBG: Recent Labs  Lab 09/08/22 1215  GLUCAP 107*     No results found for this or any previous visit (from the past 240 hour(s)).       Radiology Studies: CARDIAC CATHETERIZATION  Result Date: 09/11/2022   Mid LAD lesion is 50% stenosed.   Prox LAD lesion is 50% stenosed.   Prox RCA lesion is 40% stenosed.   2nd Mrg lesion is 30% stenosed.   LV end diastolic pressure is mildly elevated.   There is no aortic valve stenosis. No change in coronary anatomy since October 2023. Consider EP referral for consideration of AICD.         Scheduled Meds:  acetaminophen  1,000 mg Oral Q8H   amiodarone  200 mg Oral Daily   amLODipine  10 mg Oral QPC lunch   apixaban  5 mg Oral BID   bictegravir-emtricitabine-tenofovir AF  1 tablet Oral Daily   budesonide (PULMICORT)  nebulizer solution  0.25 mg Nebulization BID   calcitRIOL  1.75 mcg Oral Q M,W,F-HD   Chlorhexidine Gluconate Cloth  6 each Topical Q0600   cinacalcet  60 mg Oral Q M,W,F   diclofenac Sodium  2 g Topical QID   famotidine  20 mg Oral Daily   ferric citrate  210 mg Oral With snacks   ferric citrate  420 mg Oral TID WC   levothyroxine  25 mcg Oral Q0600   lidocaine  1 patch Transdermal Q24H   loratadine  10 mg Oral Daily   lubiprostone  24 mcg Oral BID WC   metoprolol tartrate  25 mg Oral BID   mometasone-formoterol  2 puff Inhalation BID   pantoprazole  40 mg Oral Daily   rOPINIRole  1 mg Oral QHS   rosuvastatin  10 mg Oral Daily   sodium chloride flush  3 mL Intravenous Q12H   sodium chloride flush  3 mL Intravenous Q12H   Continuous Infusions:  sodium chloride     sodium chloride     methocarbamol (ROBAXIN) IV 500 mg (09/11/22 2243)     LOS: 4 days    Time spent: over 30 min    Fayrene Helper, MD [facility]   To contact the attending provider between 7A-7P or the covering provider during after hours 7P-7A, please log into the web site [email or web address] and access using universal [place] password for that web site. If you do not have the password, please call the hospital operator.  09/12/2022, 7:08 PM

## 2022-09-12 NOTE — Care Management Important Message (Signed)
Important Message  Patient Details  Name: Albert Eaton MRN: [id number] Date of Birth: [birth year]   Medicare Important Message Given:  Yes     Shelda Altes 09/12/2022, 9:32 AM

## 2022-09-12 NOTE — Progress Notes (Signed)
Received patient in bed to unit.  Alert and oriented.  Informed consent signed and in chart.   Treatment initiated: 0912 Treatment completed: 1318  Patient tolerated well.  Transported back to the room  Alert, without acute distress.  Hand-off given to patient's nurse.   Access used: AVF Access issues: None  Total UF removed: 4L Medication(s) given: None Post HD VS: 143/88,78,98%,16,97.6 Post HD weight: 84.9kg   [facility]

## 2022-09-12 NOTE — Progress Notes (Signed)
[facility] Progress Note   Subjective:   Patient seen and examined at bedside in dialysis.  Tolerating treatment well so far.  Admits to pain in his chest and shoulder from CPR.  Denies SOB, abdominal pain and n/v/d.   Objective Vitals:   09/12/22 0912 09/12/22 0930 09/12/22 1000 09/12/22 1030  BP: (!) 158/91 (!) 172/84 (!) 173/89 (!) 160/87  Pulse: 78 77 74 71  Resp: 17 19 19 19   Temp:      TempSrc:      SpO2: 98% 98% 99% 100%  Weight:      Height:       Physical Exam General:WDWN male in NAD Heart:RRR, no mrg Lungs:CTAB anteriorly  Abdomen:soft, NTND Extremities:no LE edema Dialysis Access: LU AVF in use   Filed Weights   09/10/22 1225 09/11/22 0400 09/12/22 0539  Weight: 91.7 kg 93.3 kg 88.9 kg    Intake/Output Summary (Last 24 hours) at 09/12/2022 1118 Last data filed at 09/11/2022 2245 Gross per 24 hour  Intake 240 ml  Output --  Net 240 ml    Additional Objective Labs: Basic Metabolic Panel: Recent Labs  Lab 09/10/22 0242 09/11/22 0211 09/12/22 0452  NA 133* 136 134*  K 5.7* 4.5 4.7  CL 93* 94* 96*  CO2 24 25 23   GLUCOSE 114* 111* 111*  BUN 56* 39* 58*  CREATININE 11.09* 8.10* 10.56*  CALCIUM 9.0 8.1* 8.1*  PHOS  --  4.1 4.6   Liver Function Tests: Recent Labs  Lab 09/09/22 0147 09/11/22 0211 09/12/22 0452  AST 57* 20 11*  ALT 49* 31 21  ALKPHOS 85 90 79  BILITOT 0.2* 0.3 0.4  PROT 7.1 7.2 6.2*  ALBUMIN 3.1* 3.2* 2.8*   Recent Labs  Lab 09/08/22 1138  LIPASE 69*   CBC: Recent Labs  Lab 09/08/22 1138 09/08/22 1147 09/09/22 0147 09/10/22 0242 09/10/22 1550 09/11/22 0211 09/12/22 0452  WBC 9.3  --  9.4 11.1*  --  8.2 8.0  NEUTROABS 5.2  --   --   --   --   --  5.7  HGB 7.7*   < > 7.6* 7.2* 7.5* 7.5* 7.3*  HCT 24.2*   < > 23.6* 21.9* 23.1* 23.6* 21.9*  MCV 100.0  --  97.5 98.2  --  99.6 95.6  PLT 269  --  323 291  --  357 330   < > = values in this interval not displayed.   Blood Culture    Component  Value Date/Time   SDES  08/29/2022 1635    BLOOD RIGHT FOREARM Performed at [facility], [place] [address], [place], [place] [address]    [facility]  08/29/2022 1635    Blood Culture adequate volume BOTTLES DRAWN AEROBIC AND ANAEROBIC Performed at [facility], [address], [place], [place] [address]    CULT  08/29/2022 1635    NO GROWTH 5 DAYS Performed at [facility] [address], [place], [place] [address]    REPTSTATUS 09/03/2022 FINAL 08/29/2022 1635    CBG: Recent Labs  Lab 09/08/22 1215  GLUCAP 107*   Iron Studies:  Recent Labs    09/11/22 0211  IRON 62  TIBC 241*  FERRITIN 845*   Lab Results  Component Value Date   INR 1.3 (H) 09/08/2022   INR 1.5 (H) 08/21/2022   INR 1.2 08/19/2022   Studies/Results: CARDIAC CATHETERIZATION  Result Date: 09/11/2022   Mid LAD lesion is 50% stenosed.  Prox LAD lesion is 50% stenosed.   Prox RCA lesion is 40% stenosed.   2nd Mrg lesion is 30% stenosed.   LV end diastolic pressure is mildly elevated.   There is no aortic valve stenosis. No change in coronary anatomy since October 2023. Consider EP referral for consideration of AICD.     Medications:  sodium chloride     sodium chloride     heparin 1,850 Units/hr (09/12/22 0836)   methocarbamol (ROBAXIN) IV 500 mg (09/11/22 2243)    amiodarone  200 mg Oral Daily   amLODipine  10 mg Oral QPC lunch   apixaban  5 mg Oral BID   bictegravir-emtricitabine-tenofovir AF  1 tablet Oral Daily   budesonide (PULMICORT) nebulizer solution  0.25 mg Nebulization BID   calcitRIOL  1.75 mcg Oral Q M,W,F-HD   Chlorhexidine Gluconate Cloth  6 each Topical Q0600   cinacalcet  60 mg Oral Q M,W,F   famotidine  20 mg Oral Daily   ferric citrate  210 mg Oral With snacks   ferric citrate  420 mg Oral TID WC   levothyroxine  25 mcg Oral Q0600   lidocaine  1 patch Transdermal Q24H   loratadine  10 mg Oral Daily   lubiprostone  24 mcg Oral BID WC    metoprolol tartrate  25 mg Oral BID   mometasone-formoterol  2 puff Inhalation BID   pantoprazole  40 mg Oral Daily   rOPINIRole  1 mg Oral QHS   rosuvastatin  10 mg Oral Daily   sodium chloride flush  3 mL Intravenous Q12H   sodium chloride flush  3 mL Intravenous Q12H    Dialysis Orders: [place] MWF 4h  400/800 EDW 87.1 kg 3K/2.5Ca AVF Heparin 3000  -Mircera 200 q 2 wks (last 11/10) -Calcitriol 1.75 TIW -Sensipar 30 TIW    Assessment/Plan: Cardiac arrest during dialysis 11/13.  Required 2 mins CPR. Unclear etiology. Per cardiology -no new findings on cardiac cath. Suspect PEA arrest due to electrolyte shifts w/dialysis per cardiology. Wearing event monitor. ESRD -  HD MWF. HD today per regular schedule. Next treatment will be on Sunday due to Thanksgiving holiday schedule. Alternate schedule next week: Sun, Tues, Friday. Hypertension/volume  - BP acceptable.  Should meet dry weight today.  Anemia  - Recent ESA dose as outpatient. Hb trending down - 7.3 today Tsat 26%  Transfuse prn.  Metabolic bone disease -  Ca and phos in goal. Continue home binders/Sensipar  Afib - new onset. On amiodarone. Eliquis held. Heparin per pharmacy  Nutrition - Renal diet with fluid restriction  Hepatitis B - dialysis in isolation room  S/p recent MVR Rib fractures s/p CPR   Jen Mow, PA-C [facility] 09/12/2022,11:18 AM  LOS: 4 days

## 2022-09-12 NOTE — Plan of Care (Signed)
CHANGE IN DIALYSIS SCHEDULE DUE TO QUADIR MUNS Medici 05-05-1966 [id number]  Patient dialysis schedule for the week of 09/14/22-09/21/22 varies from their normal schedule due to Thanksgiving Holiday.  Need to keep this in mind for discharge planning.  The Holiday schedule is as follow:   Normal HD Schedule: Monday Wednesday Friday Thanksgiving schedule:Sunday, Tuesday, Friday  They will resume their normal schedule on 09/22/22.     Jen Mow, PA-C [facility] Pager: [phone]

## 2022-09-12 NOTE — Progress Notes (Signed)
ANTICOAGULATION CONSULT NOTE  Pharmacy Consult for heparin Indication: atrial fibrillation  Allergies  Allergen Reactions   Ace Inhibitors Cough and Other (See Comments)    Patient Measurements: Height: 5' 10"  (177.8 cm) Weight: 93.3 kg (205 lb 11 oz) IBW/kg (Calculated) : 73 Heparin Dosing Weight: 94 kg  Vital Signs: Temp: 99 F (37.2 C) (11/16 1947) Temp Source: Oral (11/16 1947) BP: 182/74 (11/16 2250) Pulse Rate: 81 (11/16 2250)  Labs: Recent Labs    09/10/22 0242 09/10/22 1550 09/10/22 2217 09/11/22 0211 09/12/22 0452  HGB 7.2* 7.5*  --  7.5* 7.3*  HCT 21.9* 23.1*  --  23.6* 21.9*  PLT 291  --   --  357 330  APTT  --   --  37*  --  58*  HEPARINUNFRC  --   --   --   --  >1.10*  CREATININE 11.09*  --   --  8.10*  --      Estimated Creatinine Clearance: 11.7 mL/min (A) (by C-G formula based on SCr of 8.1 mg/dL (H)).   Medical History: Past Medical History:  Diagnosis Date   Anemia    Aortic atherosclerosis (HCC)    Arthritis    Asthma    Cancer ([place])    renal cyst   Chronic diastolic CHF (congestive heart failure) ([place]) 01/06/2017   Echo 7/16 [facility] in [place], [name]) Mild AI, mild LAE, mild concentric LVH, EF 55, normal wall motion, mild to moderate MR, mild PI, RVSP 55 // Echo 10/09/16 ([facility]):  Moderate LVH, grade 2 diastolic dysfunction, mild MR, moderate LAE    Esophagitis    ESRD (end stage renal disease) on dialysis [facility])    Dialysis Mon Wed Fri   GERD (gastroesophageal reflux disease)    Gout    no current problems   Hepatitis    Hep B   HIV (human immunodeficiency virus infection) ([place])    HLD (hyperlipidemia)    Hypertension    Hypothyroidism    Mitral regurgitation    Neuropathy    Sleep apnea    uses c-pap   Wears glasses    Wears partial dentures      Assessment: 31 yoM admitted with brief cardiac arrest in HD. Pt on apixaban PTA for hx AFib. Cath planned, will hold further apixaban and start heparin infusion. Last dose  of apixaban was 11/14 pm.  Pt s/p LHC with no change in anatomy. Heparin to resume 8h after sheath removal (1045).  11/17 AM update:  aPTT low but trending up  Goal of Therapy:  Heparin level 0.3-0.7 units/ml aPTT 66-102 seconds Monitor platelets by anticoagulation protocol: Yes   Plan:  Inc heparin to 1850 units/hr Re-check heparin level and aPTT in 8 hours  Narda Bonds, PharmD, [place] Pharmacist Phone: [phone]

## 2022-09-12 NOTE — Evaluation (Signed)
Physical Therapy Evaluation Patient Details Name: Albert Eaton MRN: [id number] DOB: [birth year] Today's Date: 09/12/2022  History of Present Illness  56 yo male presents to [facility] on 11/13 s/p cardiac arrest at OP dialysis, receiving 2 min CPR. CT abd/pelvis shows bilat 3-6th rib fx, cardiomegaly, mild patchy infilrates in mid and lower lung fields, R pleural effusion. PMH includes recent hospitalization 08/21/2022 to 08/28/2022 for mitral valve repair, ESRD on HD MWF, pulmonary arterial hypertension, HIV, RCC status post left nephrectomy, chronic combined CHF, s/p mitral valve repair, h/o L cerebellar artery CVA, neuropathy, paroxysmal Afib/SVT, HTN, OSA, gout, hypothyroidism, HLD, IDA, history of prolonged QT.  Clinical Impression   Pt presents with severe chest wall pain given rib fx, min difficulty mobilizing, and decreased activity tolerance. Pt to benefit from acute PT to address deficits. Pt ambulated short hallway distance with use of RW, could have tolerated slightly further distance but session terminated prematurely given transport arrived to take pt to dialysis. Pt requires some physical assist for bed mobility at this time, pt states he has family and friends that can help him at d/c.  PT to progress mobility as tolerated, and will continue to follow acutely.         Recommendations for follow up therapy are one component of a multi-disciplinary discharge planning process, led by the attending physician.  Recommendations may be updated based on patient status, additional functional criteria and insurance authorization.  Follow Up Recommendations Home health PT      Assistance Recommended at Discharge Intermittent Supervision/Assistance  Patient can return home with the following  A little help with walking and/or transfers;A little help with bathing/dressing/bathroom    Equipment Recommendations Rolling walker (2 wheels)  Recommendations for Other Services       Functional  Status Assessment Patient has had a recent decline in their functional status and demonstrates the ability to make significant improvements in function in a reasonable and predictable amount of time.     Precautions / Restrictions Precautions Precautions: Fall Restrictions Weight Bearing Restrictions: No      Mobility  Bed Mobility Overal bed mobility: Needs Assistance Bed Mobility: Supine to Sit, Sit to Supine     Supine to sit: Min assist Sit to supine: Min assist   General bed mobility comments: assist for trunk management given severe pain    Transfers Overall transfer level: Needs assistance Equipment used: Rolling walker (2 wheels) Transfers: Sit to/from Stand Sit to Stand: Supervision           General transfer comment: for safety, slow to rise and pt reaching for RW for pain bracing    Ambulation/Gait Ambulation/Gait assistance: Supervision Gait Distance (Feet): 80 Feet Assistive device: Rolling walker (2 wheels) Gait Pattern/deviations: Step-through pattern, Decreased stride length, Trunk flexed Gait velocity: decr     General Gait Details: cues for upright posture and RW use for pain bracing  Stairs            Wheelchair Mobility    Modified Rankin (Stroke Patients Only)       Balance Overall balance assessment: Mild deficits observed, not formally tested                                           Pertinent Vitals/Pain Pain Assessment Pain Assessment: 0-10 Pain Score: 10-Worst pain ever Pain Location: chest wall Pain Descriptors / Indicators: Sore Pain Intervention(s):  Limited activity within patient's tolerance, Monitored during session, Repositioned, RN gave pain meds during session    [place] expects to be discharged to:: Private residence Living Arrangements: Alone Available Help at Discharge: Family Type of Home: Apartment Home Access: Stairs to enter Entrance Stairs-Rails:  Psychiatric nurse of Steps: flight (from the garage to te apt)   Home Layout: One level Home Equipment: None      Prior Function Prior Level of Function : Independent/Modified Independent;Driving                     Hand Dominance   Dominant Hand: Right    Extremity/Trunk Assessment   Upper Extremity Assessment Upper Extremity Assessment: Defer to OT evaluation    Lower Extremity Assessment Lower Extremity Assessment: Overall WFL for tasks assessed    Cervical / Trunk Assessment Cervical / Trunk Assessment: Other exceptions Cervical / Trunk Exceptions: truncal guarding given rib fractures and severe pain  Communication   Communication: No difficulties  Cognition Arousal/Alertness: Awake/alert Behavior During Therapy: WFL for tasks assessed/performed Overall Cognitive Status: Within Functional Limits for tasks assessed                                          General Comments      Exercises     Assessment/Plan    PT Assessment Patient needs continued PT services  PT Problem List Decreased mobility;Decreased activity tolerance;Decreased balance;Decreased knowledge of use of DME;Pain       PT Treatment Interventions DME instruction;Therapeutic activities;Gait training;Therapeutic exercise;Patient/family education;Stair training;Balance training;Functional mobility training;Neuromuscular re-education    PT Goals (Current goals can be found in the Care Plan section)  Acute Rehab PT Goals Patient Stated Goal: home PT Goal Formulation: With patient Time For Goal Achievement: 09/26/22 Potential to Achieve Goals: Good    Frequency Min 3X/week     Co-evaluation               AM-PAC PT "6 Clicks" Mobility  Outcome Measure Help needed turning from your back to your side while in a flat bed without using bedrails?: A Little Help needed moving from lying on your back to sitting on the side of a flat bed without using  bedrails?: A Little Help needed moving to and from a bed to a chair (including a wheelchair)?: A Little Help needed standing up from a chair using your arms (e.g., wheelchair or bedside chair)?: A Little Help needed to walk in hospital room?: A Little Help needed climbing 3-5 steps with a railing? : A Little 6 Click Score: 18    End of Session   Activity Tolerance: Patient tolerated treatment well;Patient limited by pain Patient left: in bed;with call bell/phone within reach;Other (comment) (transport at bedside to take pt to dialysis) Nurse Communication: Mobility status PT Visit Diagnosis: Other abnormalities of gait and mobility (R26.89);Muscle weakness (generalized) (M62.81)    Time: 1224-4975 PT Time Calculation (min) (ACUTE ONLY): 14 min   Charges:   PT Evaluation $PT Eval Low Complexity: 1 Low        Sriansh Farra S, PT DPT Acute Rehabilitation Services Pager [phone]  Office [phone]   [place] E Ruffin Pyo 09/12/2022, 10:19 AM

## 2022-09-12 NOTE — Plan of Care (Signed)

## 2022-09-12 NOTE — Progress Notes (Signed)
OT Cancellation Note  Patient Details Name: Albert Eaton MRN: [id number] DOB: 06/01/1966   Cancelled Treatment:    Reason Eval/Treat Not Completed: Patient at procedure or test/ unavailable Currently undergoing HD. Will follow up for OT eval as schedule permits.   Layla Maw 09/12/2022, 10:51 AM

## 2022-09-12 NOTE — TOC Progression Note (Signed)
Transition of Care [facility]) - Progression Note    Patient Details  Name: Graviel Payeur Delman MRN: [id number] Date of Birth: 1966-05-18  Transition of [name]) CM/SW Contact  Cyndi Bender, RN Phone Number: 09/12/2022, 4:24 PM  Clinical Narrative:    Spoke to patient regarding transition needs.  Patient offered choice for home health. Patient defers to [facility] to find highly rated agency. Malachy Mood with [facility] accepted referral .  Patient is agreeable to use in house provide ,[employer], for DME needs. [place] with [employer] notified of walker need. Address, Phone number and PCP verified. TOC will continue to follow for needs.  Expected Discharge Plan: [place] Barriers to Discharge: Continued Medical Work up  Expected Discharge Plan and Services Expected Discharge Plan: [place]   Discharge Planning Services: CM Consult Post Acute Care Choice: NA Living arrangements for the past 2 months: Apartment                 DME Arranged: Gilford Rile DME Agency: [facility] Date DME Agency Contacted: 09/12/22 Time DME Agency Contacted: [phone] Representative spoke with at DME Agency: [place]: PT [place]: [place] Date [place]: 09/12/22 Time [place]: 1623 Representative spoke with at [place]: [place] Determinants of Health ([place]) Interventions    Readmission Risk Interventions    09/10/2022    2:51 PM 08/22/2022   11:54 AM 09/05/2021   11:20 AM  Readmission Risk Prevention Plan  Transportation Screening Complete Complete Complete  Medication Review Press photographer) Complete Complete Complete  PCP or Specialist appointment within 3-5 days of discharge   Complete  HRI or [place] Complete Complete Complete  SW Recovery Care/Counseling Consult Complete Complete Complete  Palliative Care Screening Not Applicable Not Applicable Not [place] Complete Not  Applicable Not Applicable

## 2022-09-12 NOTE — Progress Notes (Signed)
[place] for heparin>>apixaban Indication: atrial fibrillation  Allergies  Allergen Reactions   Ace Inhibitors Cough and Other (See Comments)    Patient Measurements: Height: 5' 10"  (177.8 cm) Weight: 88.9 kg (195 lb 15.8 oz) IBW/kg (Calculated) : 73 Heparin Dosing Weight: 94 kg  Vital Signs: Temp: 98.7 F (37.1 C) (11/17 0845) Temp Source: Oral (11/17 0845) BP: 160/87 (11/17 1030) Pulse Rate: 71 (11/17 1030)  Labs: Recent Labs    09/10/22 0242 09/10/22 1550 09/10/22 2217 09/11/22 0211 09/12/22 0452  HGB 7.2* 7.5*  --  7.5* 7.3*  HCT 21.9* 23.1*  --  23.6* 21.9*  PLT 291  --   --  357 330  APTT  --   --  37*  --  58*  HEPARINUNFRC  --   --   --   --  >1.10*  CREATININE 11.09*  --   --  8.10* 10.56*     Estimated Creatinine Clearance: 8.8 mL/min (A) (by C-G formula based on SCr of 10.56 mg/dL (H)).   Medical History: Past Medical History:  Diagnosis Date   Anemia    Aortic atherosclerosis (HCC)    Arthritis    Asthma    Cancer ([place])    renal cyst   Chronic diastolic CHF (congestive heart failure) ([place]) 01/06/2017   Echo 7/16 [facility] [place], [name]) Mild AI, mild LAE, mild concentric LVH, EF 55, normal wall motion, mild to moderate MR, mild PI, RVSP 55 // Echo 10/09/16 ([facility]):  Moderate LVH, grade 2 diastolic dysfunction, mild MR, moderate LAE    Esophagitis    ESRD (end stage renal disease) on dialysis [facility])    Dialysis Mon Wed Fri   GERD (gastroesophageal reflux disease)    Gout    no current problems   Hepatitis    Hep B   HIV (human immunodeficiency virus infection) ([place])    HLD (hyperlipidemia)    Hypertension    Hypothyroidism    Mitral regurgitation    Neuropathy    Sleep apnea    uses c-pap   Wears glasses    Wears partial dentures      Assessment: 46 yoM admitted with brief cardiac arrest in HD. Pt on apixaban PTA for hx AFib. Cath planned, will hold further apixaban and start heparin  infusion. Last dose of apixaban was 11/14 pm.  Pt s/p LHC with no change in anatomy. Medical management currently planned. No further procedures. Discussed with medicine team, will transition back to apixaban after HD today.   Goal of Therapy:  Heparin level 0.3-0.7 units/ml aPTT 66-102 seconds Monitor platelets by anticoagulation protocol: Yes   Plan:  Stop heparin after HD and resume apixaban  Erin Hearing PharmD., BCPS Clinical Pharmacist 09/12/2022 10:46 AM

## 2022-09-13 DIAGNOSIS — I469 Cardiac arrest, cause unspecified: Secondary | ICD-10-CM | POA: Diagnosis not present

## 2022-09-13 DIAGNOSIS — I480 Paroxysmal atrial fibrillation: Secondary | ICD-10-CM | POA: Diagnosis not present

## 2022-09-13 DIAGNOSIS — S2243XA Multiple fractures of ribs, bilateral, initial encounter for closed fracture: Secondary | ICD-10-CM | POA: Diagnosis not present

## 2022-09-13 LAB — COMPREHENSIVE METABOLIC PANEL
ALT: 17 U/L (ref 0–44)
AST: 12 U/L — ABNORMAL LOW (ref 15–41)
Albumin: 3 g/dL — ABNORMAL LOW (ref 3.5–5.0)
Alkaline Phosphatase: 79 U/L (ref 38–126)
Anion gap: 16 — ABNORMAL HIGH (ref 5–15)
BUN: 48 mg/dL — ABNORMAL HIGH (ref 6–20)
CO2: 26 mmol/L (ref 22–32)
Calcium: 8.6 mg/dL — ABNORMAL LOW (ref 8.9–10.3)
Chloride: 92 mmol/L — ABNORMAL LOW (ref 98–111)
Creatinine, Ser: 9.36 mg/dL — ABNORMAL HIGH (ref 0.61–1.24)
GFR, Estimated: 6 mL/min — ABNORMAL LOW (ref >60)
Glucose, Bld: 105 mg/dL — ABNORMAL HIGH (ref 70–99)
Potassium: 4.6 mmol/L (ref 3.5–5.1)
Sodium: 134 mmol/L — ABNORMAL LOW (ref 135–145)
Total Bilirubin: 0.3 mg/dL (ref 0.3–1.2)
Total Protein: 7.1 g/dL (ref 6.5–8.1)

## 2022-09-13 LAB — CBC WITH DIFFERENTIAL/PLATELET
Abs Immature Granulocytes: 0.09 10*3/uL — ABNORMAL HIGH (ref 0.00–0.07)
Basophils Absolute: 0.1 10*3/uL (ref 0.0–0.1)
Basophils Relative: 1 %
Eosinophils Absolute: 0.3 10*3/uL (ref 0.0–0.5)
Eosinophils Relative: 4 %
HCT: 25.1 % — ABNORMAL LOW (ref 39.0–52.0)
Hemoglobin: 8.2 g/dL — ABNORMAL LOW (ref 13.0–17.0)
Immature Granulocytes: 1 %
Lymphocytes Relative: 12 %
Lymphs Abs: 1.1 10*3/uL (ref 0.7–4.0)
MCH: 31.5 pg (ref 26.0–34.0)
MCHC: 32.7 g/dL (ref 30.0–36.0)
MCV: 96.5 fL (ref 80.0–100.0)
Monocytes Absolute: 1.1 10*3/uL — ABNORMAL HIGH (ref 0.1–1.0)
Monocytes Relative: 12 %
Neutro Abs: 6.3 10*3/uL (ref 1.7–7.7)
Neutrophils Relative %: 70 %
Platelets: 352 10*3/uL (ref 150–400)
RBC: 2.6 MIL/uL — ABNORMAL LOW (ref 4.22–5.81)
RDW: 16.2 % — ABNORMAL HIGH (ref 11.5–15.5)
WBC: 9 10*3/uL (ref 4.0–10.5)
nRBC: 0 % (ref 0.0–0.2)

## 2022-09-13 LAB — LIPOPROTEIN A (LPA): Lipoprotein (a): 237.4 nmol/L — ABNORMAL HIGH (ref <75.0)

## 2022-09-13 LAB — PHOSPHORUS: Phosphorus: 5 mg/dL — ABNORMAL HIGH (ref 2.5–4.6)

## 2022-09-13 LAB — MAGNESIUM: Magnesium: 1.7 mg/dL (ref 1.7–2.4)

## 2022-09-13 MED ORDER — GABAPENTIN 100 MG PO CAPS: 100.0000 mg | ORAL_CAPSULE | Freq: Three times a day (TID) | ORAL | Status: DC | PRN

## 2022-09-13 MED ORDER — CHLORHEXIDINE GLUCONATE CLOTH 2 % EX PADS
6.0000 | MEDICATED_PAD | Freq: Every day | CUTANEOUS | Status: DC
  Administered 2022-09-14: 6 via TOPICAL

## 2022-09-13 NOTE — Progress Notes (Signed)
[facility] PROGRESS NOTE  Albert Eaton (DOB: Aug 10, 1966) TIW:[id number] PCP: [name], NP  Brief Narrative: Albert Eaton is a 56 y.o. male with medical history significant of ESRD on HD MWF, chronic HFpEF, severe MR status post mitral valve repair October 2023, PAF on amiodarone and eliquis, OSA not tolerating CPAP, hypothyroidism, HTN, HLD who presented to the ED 11/13 after cardiac arrest during dialysis. ROSC achieved   Patient does not recall any of the event during [facility] and HD center.  He reported he had a normal day yesterday and felt normal this morning before going to the HD center.  Half way into the HD process after removing about 2 L of fluid, patient became unresponsive with a GCS= 3, HD staff could not feel his radial pulses/carotid pulses and [facility] was called.  Monitor showed however patient had SVT versus A-fib.  CPR started right away and 2 rounds of epinephrine given and 1 dose of calcium gluconate pushed, then patient recovered pulses and consciousness.  During the episode, patient was also found to have agonal breathing 4-5 times a minute.  No seizure-like activity reported, no loss control of urine or bowel movement or tongue biting during the episode.  Patient reported that he has mild asthma exacerbation and has been using albuterol nebulizer daily, and confirmed is been taking amiodarone for A-fib.   ED Course: Patient was found normotensive and none tachycardia not hypoxic.  CT angiogram showed multiple fractured ribs and small bilateral pleural effusion otherwise no significant abnormalities.  EKG showed normal sinus rhythm with borderline prolonged QTc.  Troponin trending 50> 600.  Lactic acid 5.0> 2.4.  Patient was evaluated by PCCM, recommended admission to progressive care under hospitalist service.    He was admitted and had LHC which showed no change in coronary anatomy.  They recommended consideration of EP referral, but Dr. Ali Lowe recommending  ziopatch and outpatient follow up at this time with suspicion that PEA arrest is related to electrolyte shifts with dialysis.   Discharge pending improvement in pain control.  Subjective: Primary complaint is that he has pain in his chest with most movements, cough, deep breaths, splinting with pillow but still severe enough to require IV dilaudid overnight in addition to oxycodone, lidocaine, voltaren.   Objective: BP (!) 164/84   Pulse 80   Temp 98.4 F (36.9 C) (Oral)   Resp 17   Ht 5' 10"  (1.778 m)   Wt 88.1 kg   SpO2 100%   BMI 27.87 kg/m   Gen: No distress Pulm: Clear, nonlabored, limited excursions  CV: RRR, no MRG or pitting edema GI: Soft, NT, ND, +BS  Neuro: Alert and oriented. No new focal deficits. Ext: Warm, no deformities Skin: Tenderness to palpation over precordium without open skin. No new rashes, lesions or ulcers on visualized skin   Assessment & Plan: Cardiac arrest: Unclear rhythm, though no fibrillation noted, ?PEA. Coronary anatomy unchanged since last month when [facility] repeated this admission.  - No plan for cardiac MRI at this time. Ziopatch placed, follow up with cardiology after discharge.   Bilateral multiple ribs fracture: Due to CPR.  - Pain control, incentive spirometry. At high risk for atelectasis from splinting. We've adjusted oxycodone, added voltaren, topical lidocaine. Still required IV dilaudid overnight. We will continue this if needed for pain control, DC after 24 hours without IV narcotic requirement.   Acute metabolic encephalopathy: -Initial GCS=3, secondary to cardiopulmonary arrest, mentation fully recovered to baseline after CPR.  Elevated troponins -Likely from CPR, trend troponins, repeat EKG. Cath as above   Acute asthmatic exacerbation: Given steroids on admission. There is no wheezing currently. Steroids were not reordered at admission and will not be reordered at this time.  - Continue bronchodilators   PAF - Continue  amiodarone, metoprolol, eliquis.   Acute on chronic normocytic anemia - Stabilized   ESRD on HD - Continue HD per nephrology. Next 11/19 (Sunday) due to change in schedule from holiday (usually MWF, will go Sun, Tues, Fri this week).    Acute on chronic HFpEF - Continue UF with HD, euvolemic currently.    HIV - Continue Biktarvy   OSA - Not tolerating CPAP due to severe claustrophobia.     Patrecia Pour, MD [facility] [email or web address] 09/13/2022, 1:33 PM

## 2022-09-13 NOTE — Progress Notes (Signed)
Mobility Specialist Progress Note:   09/13/22 1146  Mobility  Activity Ambulated with assistance in hallway  Level of Assistance Standby assist, set-up cues, supervision of patient - no hands on  Assistive Device Front wheel walker  Distance Ambulated (ft) 300 ft  Activity Response Tolerated well  $Mobility charge 1 Mobility   During Mobility: 86 HR Post Mobility:  77 HR; 153/84 BP  Pt received in chair willing to participate in mobility. No complaints of pain. Left in bed with call bell in reach and all needs met.   Gareth Eagle Savi Lastinger Mobility Specialist Please contact via [employer] or  Rehab Office at [phone]

## 2022-09-13 NOTE — Progress Notes (Signed)
[facility] Progress Note   Subjective:   Patient seen and examined at bedside.  Reports intermittent sharp pains in his chest, worsened with movement.  States he had twitching in his sleep overnight that would wake him up with pain.  Denies chest pressure, SOB, abdominal pain and n/v/d.    Objective Vitals:   09/12/22 2043 09/12/22 2045 09/13/22 0500 09/13/22 0944  BP:   (!) 151/80 (!) 162/92  Pulse: 85  79 81  Resp: 16  18 18   Temp:   98.8 F (37.1 C) 98.8 F (37.1 C)  TempSrc:   Oral Oral  SpO2: 99% 99% 100% 100%  Weight:   88.1 kg   Height:       Physical Exam General:WDWN male in NAD Heart:RRR, no mrg Lungs:CTAB, nml WOB onRA Abdomen:soft, NTND Extremities:no LE edema Dialysis Access: LU AVF +b/t   Filed Weights   09/11/22 0400 09/12/22 0539 09/13/22 0500  Weight: 93.3 kg 88.9 kg 88.1 kg    Intake/Output Summary (Last 24 hours) at 09/13/2022 1203 Last data filed at 09/12/2022 2110 Gross per 24 hour  Intake 240 ml  Output 4000 ml  Net -3760 ml    Additional Objective Labs: Basic Metabolic Panel: Recent Labs  Lab 09/11/22 0211 09/12/22 0452 09/13/22 0709  NA 136 134* 134*  K 4.5 4.7 4.6  CL 94* 96* 92*  CO2 25 23 26   GLUCOSE 111* 111* 105*  BUN 39* 58* 48*  CREATININE 8.10* 10.56* 9.36*  CALCIUM 8.1* 8.1* 8.6*  PHOS 4.1 4.6 5.0*   Liver Function Tests: Recent Labs  Lab 09/11/22 0211 09/12/22 0452 09/13/22 0709  AST 20 11* 12*  ALT 31 21 17   ALKPHOS 90 79 79  BILITOT 0.3 0.4 0.3  PROT 7.2 6.2* 7.1  ALBUMIN 3.2* 2.8* 3.0*   Recent Labs  Lab 09/08/22 1138  LIPASE 69*   CBC: Recent Labs  Lab 09/08/22 1138 09/08/22 1147 09/09/22 0147 09/10/22 0242 09/10/22 1550 09/11/22 0211 09/12/22 0452 09/13/22 0709  WBC 9.3  --  9.4 11.1*  --  8.2 8.0 9.0  NEUTROABS 5.2  --   --   --   --   --  5.7 6.3  HGB 7.7*   < > 7.6* 7.2*   < > 7.5* 7.3* 8.2*  HCT 24.2*   < > 23.6* 21.9*   < > 23.6* 21.9* 25.1*  MCV 100.0  --  97.5  98.2  --  99.6 95.6 96.5  PLT 269  --  323 291  --  357 330 352   < > = values in this interval not displayed.   Blood Culture    Component Value Date/Time   SDES  08/29/2022 1635    BLOOD RIGHT FOREARM Performed at [facility], [place] [address], [place], [place] [address]    [facility]  08/29/2022 1635    Blood Culture adequate volume BOTTLES DRAWN AEROBIC AND ANAEROBIC Performed at [facility], [address] [place], [place] [address]    CULT  08/29/2022 1635    NO GROWTH 5 DAYS Performed at [facility] [address], [place], [place] [address]    REPTSTATUS 09/03/2022 FINAL 08/29/2022 1635    CBG: Recent Labs  Lab 09/08/22 1215  GLUCAP 107*   Iron Studies:  Recent Labs    09/11/22 0211  IRON 62  TIBC 241*  FERRITIN 845*   Lab Results  Component Value Date   INR 1.3 (H) 09/08/2022  INR 1.5 (H) 08/21/2022   INR 1.2 08/19/2022   Studies/Results: No results found.  Medications:  sodium chloride     sodium chloride     methocarbamol (ROBAXIN) IV 500 mg (09/11/22 2243)    acetaminophen  1,000 mg Oral Q8H   amiodarone  200 mg Oral Daily   amLODipine  10 mg Oral QPC lunch   apixaban  5 mg Oral BID   bictegravir-emtricitabine-tenofovir AF  1 tablet Oral Daily   budesonide (PULMICORT) nebulizer solution  0.25 mg Nebulization BID   calcitRIOL  1.75 mcg Oral Q M,W,F-HD   cinacalcet  60 mg Oral Q M,W,F   diclofenac Sodium  2 g Topical QID   famotidine  20 mg Oral Daily   ferric citrate  210 mg Oral With snacks   ferric citrate  420 mg Oral TID WC   levothyroxine  25 mcg Oral Q0600   lidocaine  1 patch Transdermal Q24H   loratadine  10 mg Oral Daily   lubiprostone  24 mcg Oral BID WC   metoprolol tartrate  25 mg Oral BID   mometasone-formoterol  2 puff Inhalation BID   pantoprazole  40 mg Oral Daily   rOPINIRole  1 mg Oral QHS   rosuvastatin  10 mg Oral Daily   sodium chloride flush  3 mL Intravenous Q12H   sodium  chloride flush  3 mL Intravenous Q12H    Dialysis Orders: [facility] MWF 4h  400/800 EDW 87.1 kg 3K/2.5Ca AVF Heparin 3000  -Mircera 200 q 2 wks (last 11/10) -Calcitriol 1.75 TIW -Sensipar 30 TIW    Assessment/Plan: Cardiac arrest during dialysis 11/13.  Required 2 mins CPR. Unclear etiology. Per cardiology -no new findings on cardiac cath. Suspect PEA arrest due to electrolyte shifts w/dialysis per cardiology. Wearing event monitor. Twitching - ?med related, oxycodone?, gabapentin - although has not been given in last few days changed dose to 1104m TID.  ESRD -  HD MWF.  Next treatment will be on Sunday due to Thanksgiving holiday schedule.  Alternate schedule next week: Sun, Tues, Friday. Hypertension/volume  - BP acceptable.  Does not appear volume overloaded.  Anemia  - Recent ESA dose as outpatient. Hb ^8.2 today Tsat 26%  Transfuse prn.  Metabolic bone disease -  Ca and phos in goal. Continue home binders/Sensipar  Afib - new onset. On amiodarone & Eliquis. Heparin per pharmacy  Nutrition - Renal diet with fluid restriction  Hepatitis B - dialysis in isolation room  S/p recent MVR Rib fractures s/p CPR - Big issue right now is pain control.   LJen Mow PA-C [facility] 09/13/2022,12:03 PM  LOS: 5 days

## 2022-09-13 NOTE — Evaluation (Signed)
Occupational Therapy Evaluation Patient Details Name: Albert Eaton MRN: [id number] DOB: 1966/09/11 Today's Date: 09/13/2022   History of Present Illness 56 yo male presents to [facility] on 11/13 s/p cardiac arrest at OP dialysis, receiving 2 min CPR. CT abd/pelvis shows bilat 3-6th rib fx, cardiomegaly, mild patchy infilrates in mid and lower lung fields, R pleural effusion. PMH includes recent hospitalization 08/21/2022 to 08/28/2022 for mitral valve repair, ESRD on HD MWF, pulmonary arterial hypertension, HIV, RCC status post left nephrectomy, chronic combined CHF, s/p mitral valve repair, h/o L cerebellar artery CVA, neuropathy, paroxysmal Afib/SVT, HTN, OSA, gout, hypothyroidism, HLD, IDA, history of prolonged QT.   Clinical Impression   Pt currently modified independent for simulated selfcare tasks, toilet transfers, and simulated tub transfers.  Pt maintains guarded position with at least one UE across chest secondary to rib pain but exhibits no LOB with any functional mobility.  Min assist for supine to sit from the EOB with HOB slightly elevated to 25 degrees.  At home pt will have to prop up on pillows or use the couch since he currently cannot get up from flat position secondary to pain.  No further acute OT needs or follow-up recommended.        Recommendations for follow up therapy are one component of a multi-disciplinary discharge planning process, led by the attending physician.  Recommendations may be updated based on patient status, additional functional criteria and insurance authorization.   Follow Up Recommendations  No OT follow up     Assistance Recommended at Discharge PRN  Patient can return home with the following Assist for transportation    Functional Status Assessment  Patient has not had a recent decline in their functional status  Equipment Recommendations  None recommended by OT       Precautions / Restrictions Precautions Precautions:  None Restrictions Weight Bearing Restrictions: No      Mobility Bed Mobility Overal bed mobility: Needs Assistance Bed Mobility: Supine to Sit, Sit to Supine     Supine to sit: Min assist          Transfers Overall transfer level: Modified independent Equipment used: None Transfers: Sit to/from Stand Sit to Stand: Modified independent (Device/Increase time)           General transfer comment: Pt with slower movements but able to complete sit to stand from elevated bed and lower toilet without assist.      Balance Overall balance assessment: Mild deficits observed, not formally tested                                         ADL either performed or assessed with clinical judgement   ADL Overall ADL's : Modified independent                                       General ADL Comments: Pt moves slower and guarded secondary to pain.  He does exhibit slight difficulty with supine to sit, requiring min assist but otherwise he did not need assist with any transfers to and from the toilet or simulated tub and was modified independent with simulated selfcare sit to stand.    Discussed need for LH sponge for washing lower legs and feet if bending down is too painful.  Also discussed shower seat but pt  does not want and would rather get LH sponge.  No further acute OT needs.     Vision Baseline Vision/History: 1 Wears glasses (reading only) Ability to See in Adequate Light: 0 Adequate Patient Visual Report: No change from baseline Vision Assessment?: No apparent visual deficits            Pertinent Vitals/Pain Pain Assessment Pain Assessment: (P) 0-10 Pain Score: (P) 10-Worst pain ever Pain Location: (P) chest wall Pain Descriptors / Indicators: (P) Sore Pain Intervention(s): (P) Limited activity within patient's tolerance, Premedicated before session     Hand Dominance Right   Extremity/Trunk Assessment Upper Extremity  Assessment Upper Extremity Assessment: Overall WFL for tasks assessed (limitations in arm strength secondary to rib pain, otherwise WFLS)   Lower Extremity Assessment Lower Extremity Assessment: Defer to PT evaluation   Cervical / Trunk Assessment Cervical / Trunk Assessment: Other exceptions Cervical / Trunk Exceptions: truncal guarding given rib fractures and severe pain,  keeps pillow for comfort   Communication Communication Communication: No difficulties   Cognition Arousal/Alertness: Awake/alert Behavior During Therapy: WFL for tasks assessed/performed Overall Cognitive Status: Within Functional Limits for tasks assessed                                                  Home Living Family/patient expects to be discharged to:: Private residence Living Arrangements: Alone Available Help at Discharge: Family;Available PRN/intermittently (nephew) Type of Home: Apartment Home Access: Stairs to enter [employer] of Steps: flight (from the garage to te apt) Entrance Stairs-Rails: Right;Left Home Layout: One level     Bathroom Shower/Tub: Teacher, early years/pre: Standard     Home Equipment: None          Prior Functioning/Environment Prior Level of Function : Independent/Modified Independent;Driving                                           AM-PAC OT "6 Clicks" Daily Activity     Outcome Measure Help from another person eating meals?: None Help from another person taking care of personal grooming?: None Help from another person toileting, which includes using toliet, bedpan, or urinal?: None Help from another person bathing (including washing, rinsing, drying)?: None Help from another person to put on and taking off regular upper body clothing?: None Help from another person to put on and taking off regular lower body clothing?: None 6 Click Score: 24   End of Session Nurse Communication: Mobility  status  Activity Tolerance: Patient tolerated treatment well Patient left: in chair;with call bell/phone within reach;with nursing/sitter in room                   Time: 0912-0944 OT Time Calculation (min): 32 min Charges:  OT General Charges $OT Visit: 1 Visit OT Evaluation $OT Eval Low Complexity: 1 Low OT Treatments $Self Care/Home Management : 8-22 mins  Abrianna Sidman OTR/L 09/13/2022, 9:52 AM

## 2022-09-14 DIAGNOSIS — I469 Cardiac arrest, cause unspecified: Secondary | ICD-10-CM | POA: Diagnosis not present

## 2022-09-14 LAB — RENAL FUNCTION PANEL
Albumin: 2.8 g/dL — ABNORMAL LOW (ref 3.5–5.0)
Anion gap: 19 — ABNORMAL HIGH (ref 5–15)
BUN: 66 mg/dL — ABNORMAL HIGH (ref 6–20)
CO2: 23 mmol/L (ref 22–32)
Calcium: 8.8 mg/dL — ABNORMAL LOW (ref 8.9–10.3)
Chloride: 88 mmol/L — ABNORMAL LOW (ref 98–111)
Creatinine, Ser: 11.49 mg/dL — ABNORMAL HIGH (ref 0.61–1.24)
GFR, Estimated: 5 mL/min — ABNORMAL LOW (ref >60)
Glucose, Bld: 95 mg/dL (ref 70–99)
Phosphorus: 6.1 mg/dL — ABNORMAL HIGH (ref 2.5–4.6)
Potassium: 4.4 mmol/L (ref 3.5–5.1)
Sodium: 130 mmol/L — ABNORMAL LOW (ref 135–145)

## 2022-09-14 LAB — CBC
HCT: 24.6 % — ABNORMAL LOW (ref 39.0–52.0)
Hemoglobin: 8 g/dL — ABNORMAL LOW (ref 13.0–17.0)
MCH: 31.3 pg (ref 26.0–34.0)
MCHC: 32.5 g/dL (ref 30.0–36.0)
MCV: 96.1 fL (ref 80.0–100.0)
Platelets: 367 10*3/uL (ref 150–400)
RBC: 2.56 MIL/uL — ABNORMAL LOW (ref 4.22–5.81)
RDW: 16.3 % — ABNORMAL HIGH (ref 11.5–15.5)
WBC: 9.1 10*3/uL (ref 4.0–10.5)
nRBC: 0 % (ref 0.0–0.2)

## 2022-09-14 MED ORDER — OXYCODONE HCL 5 MG PO TABS: 5.0000 mg | ORAL_TABLET | Freq: Four times a day (QID) | ORAL | 0 refills | Status: DC | PRN

## 2022-09-14 MED ORDER — PENTAFLUOROPROP-TETRAFLUOROETH EX AERO: 1.0000 | INHALATION_SPRAY | CUTANEOUS | Status: DC | PRN

## 2022-09-14 MED ORDER — AMIODARONE HCL 200 MG PO TABS: 200.0000 mg | ORAL_TABLET | Freq: Every day | ORAL | Status: DC

## 2022-09-14 MED ORDER — LIDOCAINE-PRILOCAINE 2.5-2.5 % EX CREA: 1.0000 | TOPICAL_CREAM | CUTANEOUS | Status: DC | PRN

## 2022-09-14 MED ORDER — LIDOCAINE 5 % EX PTCH: 1.0000 | MEDICATED_PATCH | CUTANEOUS | 0 refills | Status: DC

## 2022-09-14 MED ORDER — GABAPENTIN 300 MG PO CAPS: 300.0000 mg | ORAL_CAPSULE | Freq: Every day | ORAL | Status: DC | PRN

## 2022-09-14 MED ORDER — LIDOCAINE HCL (PF) 1 % IJ SOLN: 5.0000 mL | INTRAMUSCULAR | Status: DC | PRN

## 2022-09-14 MED ORDER — HEPARIN SODIUM (PORCINE) 1000 UNIT/ML DIALYSIS
3000.0000 [IU] | INTRAMUSCULAR | Status: DC | PRN
  Administered 2022-09-14: 3000 [IU] via INTRAVENOUS_CENTRAL

## 2022-09-14 MED ORDER — DICLOFENAC SODIUM 1 % EX GEL: 2.0000 g | Freq: Four times a day (QID) | CUTANEOUS | 0 refills | Status: DC

## 2022-09-14 MED ORDER — ACETAMINOPHEN EXTRA STRENGTH 500 MG PO TABS: 1000.0000 mg | ORAL_TABLET | Freq: Three times a day (TID) | ORAL | Status: DC

## 2022-09-14 MED ORDER — ANTICOAGULANT SODIUM CITRATE 4% (200MG/5ML) IV SOLN: 5.0000 mL | Status: DC | PRN

## 2022-09-14 MED ORDER — HEPARIN SODIUM (PORCINE) 1000 UNIT/ML IJ SOLN
INTRAMUSCULAR | Status: AC
  Filled 2022-09-14: qty 3

## 2022-09-14 MED ORDER — ALTEPLASE 2 MG IJ SOLR: 2.0000 mg | Freq: Once | INTRAMUSCULAR | Status: DC | PRN

## 2022-09-14 MED ORDER — HEPARIN SODIUM (PORCINE) 1000 UNIT/ML DIALYSIS: 1000.0000 [IU] | INTRAMUSCULAR | Status: DC | PRN

## 2022-09-14 NOTE — Progress Notes (Signed)
[facility] Progress Note   Subjective:   Patient seen and examined at bedside during dialysis.  Tolerating treatment well so far.  Working on pain control.  Denies SOB, abdominal pain and n/v/d.   Objective Vitals:   09/14/22 0533 09/14/22 0809 09/14/22 0820 09/14/22 0907  BP: (!) 158/85 (!) 173/94 (!) 186/88 (!) 141/79  Pulse: 80 80 79 74  Resp: 16 17 15 12   Temp: 98.5 F (36.9 C) 98.1 F (36.7 C)    TempSrc: Oral Oral    SpO2:   100% 97%  Weight: 88.3 kg     Height:       Physical Exam General:WDWN male in NAD Heart:RRR Lungs:nml WOB on RA Abdomen:soft, NTND Extremities:no LE edema Dialysis Access: LU AVF +b/t   Filed Weights   09/12/22 0539 09/13/22 0500 09/14/22 0533  Weight: 88.9 kg 88.1 kg 88.3 kg    Intake/Output Summary (Last 24 hours) at 09/14/2022 0935 Last data filed at 09/13/2022 1400 Gross per 24 hour  Intake 240 ml  Output --  Net 240 ml    Additional Objective Labs: Basic Metabolic Panel: Recent Labs  Lab 09/12/22 0452 09/13/22 0709 09/14/22 0815  NA 134* 134* 130*  K 4.7 4.6 4.4  CL 96* 92* 88*  CO2 23 26 23   GLUCOSE 111* 105* 95  BUN 58* 48* 66*  CREATININE 10.56* 9.36* 11.49*  CALCIUM 8.1* 8.6* 8.8*  PHOS 4.6 5.0* 6.1*   Liver Function Tests: Recent Labs  Lab 09/11/22 0211 09/12/22 0452 09/13/22 0709 09/14/22 0815  AST 20 11* 12*  --   ALT 31 21 17   --   ALKPHOS 90 79 79  --   BILITOT 0.3 0.4 0.3  --   PROT 7.2 6.2* 7.1  --   ALBUMIN 3.2* 2.8* 3.0* 2.8*   Recent Labs  Lab 09/08/22 1138  LIPASE 69*   CBC: Recent Labs  Lab 09/08/22 1138 09/08/22 1147 09/10/22 0242 09/10/22 1550 09/11/22 0211 09/12/22 0452 09/13/22 0709 09/14/22 0815  WBC 9.3   < > 11.1*  --  8.2 8.0 9.0 9.1  NEUTROABS 5.2  --   --   --   --  5.7 6.3  --   HGB 7.7*   < > 7.2*   < > 7.5* 7.3* 8.2* 8.0*  HCT 24.2*   < > 21.9*   < > 23.6* 21.9* 25.1* 24.6*  MCV 100.0   < > 98.2  --  99.6 95.6 96.5 96.1  PLT 269   < > 291  --   357 330 352 367   < > = values in this interval not displayed.   CBG: Recent Labs  Lab 09/08/22 1215  GLUCAP 107*    Medications:  sodium chloride     sodium chloride     anticoagulant sodium citrate     methocarbamol (ROBAXIN) IV 500 mg (09/11/22 2243)    acetaminophen  1,000 mg Oral Q8H   amiodarone  200 mg Oral Daily   amLODipine  10 mg Oral QPC lunch   apixaban  5 mg Oral BID   bictegravir-emtricitabine-tenofovir AF  1 tablet Oral Daily   budesonide (PULMICORT) nebulizer solution  0.25 mg Nebulization BID   calcitRIOL  1.75 mcg Oral Q M,W,F-HD   Chlorhexidine Gluconate Cloth  6 each Topical Q0600   cinacalcet  60 mg Oral Q M,W,F   diclofenac Sodium  2 g Topical QID   famotidine  20 mg Oral Daily   ferric citrate  210 mg Oral With snacks   ferric citrate  420 mg Oral TID WC   heparin sodium (porcine)       levothyroxine  25 mcg Oral Q0600   lidocaine  1 patch Transdermal Q24H   loratadine  10 mg Oral Daily   lubiprostone  24 mcg Oral BID WC   metoprolol tartrate  25 mg Oral BID   mometasone-formoterol  2 puff Inhalation BID   pantoprazole  40 mg Oral Daily   rOPINIRole  1 mg Oral QHS   rosuvastatin  10 mg Oral Daily   sodium chloride flush  3 mL Intravenous Q12H   sodium chloride flush  3 mL Intravenous Q12H    Dialysis Orders: [facility] MWF 4h  400/800 EDW 87.1 kg 3K/2.5Ca AVF Heparin 3000  -Mircera 200 q 2 wks (last 11/10) -Calcitriol 1.75 TIW -Sensipar 30 TIW    Assessment/Plan: Cardiac arrest during dialysis 11/13.  Required 2 mins CPR. Unclear etiology. Per cardiology -no new findings on cardiac cath. Suspect PEA arrest due to electrolyte shifts w/dialysis per cardiology. Wearing event monitor. Twitching - ?med related, oxycodone?, gabapentin - although has not been given in last few days changed dose to 172m TID.  ESRD -  HD MWF.  Next treatment today due to Thanksgiving holiday schedule.  Alternate schedule next week: Sun, Tues, Friday. Hypertension/volume   - BP acceptable.  Does not appear volume overloaded. UF as tolerated. Anemia  - Recent ESA dose as outpatient. Hb ^8.0 today Tsat 26%  Transfuse prn.  Metabolic bone disease -  Ca and phos in goal. Continue home binders/Sensipar  Afib - new onset. On amiodarone & Eliquis. Heparin per pharmacy  Nutrition - Renal diet with fluid restriction  Hepatitis B - dialysis in isolation room  S/p recent MVR Rib fractures s/p CPR - Working on pain control.  LJen Mow PA-C [facility] 09/14/2022,9:35 AM  LOS: 6 days

## 2022-09-14 NOTE — Discharge Summary (Signed)
Physician Discharge Summary   Patient: Albert Eaton MRN: [id number] DOB: 1966/07/05  Admit date:     09/08/2022  Discharge date: 09/14/22  Discharge Physician: Patrecia Pour   PCP: [name], NP   Recommendations at discharge:  Follow up with cardiology 11/21 at 8am as scheduled. Continue amiodarone 29m daily, metoprolol. Ziopatch placed prior to discharge.   Discharge Diagnoses: Principal Problem:   Cardiac arrest ([facility]  [place] Course: Albert Eaton is a 56y.o. male with medical history significant of ESRD on HD MWF, chronic HFpEF, severe MR status post mitral valve repair October 2023, PAF on amiodarone and eliquis, OSA not tolerating CPAP, hypothyroidism, HTN, HLD who presented to the ED 11/13 after cardiac arrest during dialysis. ROSC achieved   Patient does not recall any of the event during [facility] and HD center.  He reported he had a normal day yesterday and felt normal this morning before going to the HD center.  Half way into the HD process after removing about 2 L of fluid, patient became unresponsive with a GCS= 3, HD staff could not feel his radial pulses/carotid pulses and [facility] was called.  Monitor showed however patient had SVT versus A-fib.  CPR started right away and 2 rounds of epinephrine given and 1 dose of calcium gluconate pushed, then patient recovered pulses and consciousness.  During the episode, patient was also found to have agonal breathing 4-5 times a minute.  No seizure-like activity reported, no loss control of urine or bowel movement or tongue biting during the episode.  Patient reported that he has mild asthma exacerbation and has been using albuterol nebulizer daily, and confirmed is been taking amiodarone for A-fib.   ED Course: Patient was found normotensive and none tachycardia not hypoxic.  CT angiogram showed multiple fractured ribs and small bilateral pleural effusion otherwise no significant abnormalities.  EKG showed normal sinus  rhythm with borderline prolonged QTc.  Troponin trending 50> 600.  Lactic acid 5.0> 2.4.  Patient was evaluated by PCCM, recommended admission to progressive care under hospitalist service.    He was admitted and had LHC which showed no change in coronary anatomy.  They recommended consideration of EP referral, but Dr. TAli Lowerecommending ziopatch and outpatient follow up at this time with suspicion that PEA arrest is related to electrolyte shifts with dialysis.   He was ultimately discharged after pain control with oral and topical agents was sufficient.  Assessment and Plan: Cardiac arrest: Unclear rhythm, though no fibrillation noted, ?PEA. [facility]  - No plan for cardiac MRI at this time. Ziopatch placed, follow up with cardiology after discharge.    Bilateral multiple ribs fracture: Due to CPR.  - Pain control, incentive spirometry. At high risk for atelectasis from splinting. We've adjusted oxycodone, added voltaren, topical lidocaine. Prescribed at DC after PDMP review.   Acute metabolic encephalopathy: -Initial GCS=3, secondary to cardiopulmonary arrest, mentation fully recovered to baseline after CPR.   Elevated troponins -Likely from CPR, trend troponins, repeat EKG. Cath unchanged as above   Acute asthmatic exacerbation: Given steroids on admission. There is no wheezing currently, steroids not continued. - Continue bronchodilators   PAF - Continue amiodarone, metoprolol, eliquis.    Acute on chronic normocytic anemia - Stabilized   ESRD on HD - Continue HD per nephrology. Received off schedule 11/19 (Sunday) due to change in schedule from holiday (usually MWF, will go Sun (today), Tues, Fri this  week).    Acute on chronic HFpEF - Continue UF with HD, euvolemic currently.    HIV - Continue Biktarvy   OSA - Not tolerating CPAP due to severe claustrophobia.   Consultants: Cardiology,  Nephrology Procedures performed: HD, zio patch placement  Disposition: Home Diet recommendation:  Discharge Diet Orders (From admission, onward)     Start     Ordered   09/14/22 0000  Diet - low sodium heart healthy        09/14/22 0950           DISCHARGE MEDICATION: Allergies as of 09/14/2022       Reactions   Ace Inhibitors Cough, Other (See Comments)        Medication List     TAKE these medications    Acetaminophen Extra Strength 500 MG Tabs Take 2 tablets (1,000 mg total) by mouth every 8 (eight) hours. What changed:  when to take this reasons to take this   amiodarone 200 MG tablet Commonly known as: PACERONE Take 1 tablet (200 mg total) by mouth daily. What changed:  when to take this additional instructions   amLODipine 10 MG tablet Commonly known as: NORVASC Take 1 tablet (10 mg total) by mouth daily. What changed: when to take this   apixaban 5 MG Tabs tablet Commonly known as: ELIQUIS Take 1 tablet (5 mg total) by mouth 2 (two) times daily.   azelastine 0.1 % nasal spray Commonly known as: ASTELIN Place 2 sprays into both nostrils 2 (two) times daily as needed for allergies.   Biktarvy 50-200-25 MG Tabs tablet Generic drug: bictegravir-emtricitabine-tenofovir AF TAKE ONE TABLET BY MOUTH EVERY DAY   calcitRIOL 0.25 MCG capsule Commonly known as: ROCALTROL Take 7 capsules (1.75 mcg total) by mouth every Monday, Wednesday, and Friday with hemodialysis.   desloratadine 5 MG tablet Commonly known as: Clarinex Take 1 tablet (5 mg total) by mouth daily. What changed: when to take this   DIALYVITE 800-ZINC 15 PO Take 1 tablet by mouth daily.   DIALYVITE PO Take 1 tablet by mouth daily.   diclofenac Sodium 1 % Gel Commonly known as: VOLTAREN Apply 2 g topically 4 (four) times daily. Apply to chest pain when lidocaine patch not in place   famotidine 20 MG tablet Commonly known as: PEPCID Take 1 tablet (20 mg total) by mouth 2 (two)  times daily. What changed:  when to take this reasons to take this   ferric citrate 1 GM 210 MG(Fe) tablet Commonly known as: AURYXIA Take 420 mg by mouth See admin instructions. Take 420 mg by mouth three times a day with meals and 210 mg with each snack   Flovent HFA 110 MCG/ACT inhaler Generic drug: fluticasone Inhale 2 puffs into the lungs 2 (two) times daily as needed ("for flares").   gabapentin 300 MG capsule Commonly known as: NEURONTIN Take 1 capsule (300 mg total) by mouth daily as needed (pain/neuropathy). What changed: See the new instructions.   ipratropium 0.06 % nasal spray Commonly known as: ATROVENT USE 2 SPRAYS IN EACH NOSTRIL 2 TO 3 TIMES DAILY AS NEEDED What changed:  how much to take how to take this when to take this additional instructions   levalbuterol 45 MCG/ACT inhaler Commonly known as: XOPENEX HFA Inhale 2 puffs into the lungs every 4 (four) hours as needed for wheezing.   levothyroxine 25 MCG tablet Commonly known as: SYNTHROID Take 25 mcg by mouth daily before breakfast.   lidocaine 5 % Commonly  known as: [place] 1 patch onto the skin daily. Remove & Discard patch within 12 hours or as directed by MD   lubiprostone 24 MCG capsule Commonly known as: AMITIZA Take 1 capsule (24 mcg total) by mouth 2 (two) times daily with a meal. NEEDS OFFICE VISIT FOR FURTHER REFILLS   metoprolol tartrate 25 MG tablet Commonly known as: LOPRESSOR Take 1 tablet (25 mg total) by mouth 2 (two) times daily.   MIRCERA IJ Inject 1 each into the skin every 30 (thirty) days. Administered at dialysis center once a month   oxyCODONE 5 MG immediate release tablet Commonly known as: Oxy IR/ROXICODONE Take 1 tablet (5 mg total) by mouth every 6 (six) hours as needed for severe pain or moderate pain. What changed:  when to take this reasons to take this   pantoprazole 40 MG tablet Commonly known as: PROTONIX Take 1 tablet (40 mg total) by mouth daily.  **PLEASE CONTACT THE OFFICE TO SCHEDULE FOLLOW UP What changed:  when to take this additional instructions   Pataday 0.7 % Soln Generic drug: Olopatadine HCl Place 1 drop into both eyes 2 (two) times daily as needed (allergies).   rOPINIRole 1 MG tablet Commonly known as: REQUIP TAKE ONE TABLET BY MOUTH AT BEDTIME   rosuvastatin 10 MG tablet Commonly known as: CRESTOR TAKE ONE TABLET BY MOUTH DAILY   Sensipar 60 MG tablet Generic drug: cinacalcet Take 60 mg by mouth See admin instructions. Take 60 mg by mouth with supper after dialysis treatments on Mon/Wed/Fri   Spacer/Aero-Holding [place] 1 Device by Does not apply route 2 (two) times a day.   Symbicort 160-4.5 MCG/ACT inhaler Generic drug: budesonide-formoterol Inhale 2 puffs into the lungs daily.   Vitamin D (Ergocalciferol) 1.25 MG (50000 UNIT) Caps capsule Commonly known as: DRISDOL Take 50,000 Units by mouth every Tuesday.               Durable Medical Equipment  (From admission, onward)           Start     Ordered   09/12/22 1621  For home use only DME Walker rolling  Once       Question Answer Comment  Walker: With [place] Wheels   Patient needs a walker to treat with the following condition Weakness      09/12/22 1621   09/12/22 1026  For home use only DME Walker rolling  Once       Question Answer Comment  Walker: With [place] Wheels   Patient needs a walker to treat with the following condition Gait difficulty      09/12/22 1025            [place] Follow up.   Why: Home health has been arranged. They will contact you to schedule apt. Contact information: [address] [place] [address] [phone]         Cable, James M, NP Follow up.   Specialties: Nurse Practitioner, Family Medicine Contact information: [address] [place] [address] [phone]                Discharge Exam: Danley Danker Weights   09/13/22 0500  09/14/22 0533 09/14/22 0809  Weight: 88.1 kg 88.3 kg 90 kg  Seen in HD, tolerating well, no no distress, requesting discharge home RRR, no MRG or pitting edema Clear, diminished but nonlabored Anterior chest with lidocaine patch on, tender to palpation  Condition  at discharge: stable  The results of significant diagnostics from this hospitalization (including imaging, microbiology, ancillary and laboratory) are listed below for reference.   Imaging Studies: CARDIAC CATHETERIZATION  Result Date: 09/11/2022   Mid LAD lesion is 50% stenosed.   Prox LAD lesion is 50% stenosed.   Prox RCA lesion is 40% stenosed.   2nd Mrg lesion is 30% stenosed.   LV end diastolic pressure is mildly elevated.   There is no aortic valve stenosis. No change in coronary anatomy since October 2023. Consider EP referral for consideration of AICD.    ECHOCARDIOGRAM LIMITED  Result Date: 09/10/2022    ECHOCARDIOGRAM REPORT   Patient Name:   Albert Eaton Date of Exam: 09/09/2022 Medical Rec #:  [id number]       Height:       70.0 in Accession #:    [id number]      Weight:       204.6 lb Date of Birth:  1966-01-06        BSA:          2.108 m Patient Age:    42 years        BP:           140/96 mmHg Patient Gender: M               HR:           75 bpm. Exam Location:  Inpatient Procedure: 2D Echo, Limited Echo, Cardiac Doppler, Limited Color Doppler and            Intracardiac Opacification Agent Indications:     Cardiac arrest I46.9  History:         Patient has prior history of Echocardiogram examinations, most                  recent 08/22/2022. NSTEMI, Abnormal ECG, CVA, Mitral Valve                  Disease, Arrythmias:Cardiac Arrest, Signs/Symptoms:Chest Pain                  and Shortness of Breath; Risk Factors:Hypertension, Sleep                  Apnea, Dyslipidemia and Former Smoker.                   Mitral Valve: 30 mm Medtronic prosthetic annuloplasty ring                  valve is present in the mitral position.  Procedure Date:                  08/21/22.  Sonographer:     Greer Pickerel Referring Phys:  [id number] [name] Diagnosing Phys: Buford Dresser MD  Sonographer Comments: Image acquisition challenging due to patient body habitus and Image acquisition challenging due to respiratory motion. IMPRESSIONS  1. Left ventricular ejection fraction, by estimation, is 45 to 50%. The left ventricle has mildly decreased function. The left ventricle demonstrates global hypokinesis. There is moderate concentric left ventricular hypertrophy. Left ventricular diastolic function could not be evaluated.  2. Right ventricular systolic function was not well visualized. The right ventricular size is moderately enlarged.  3. The mitral valve has been repaired/replaced. Trivial mitral valve regurgitation. There is a 30 mm Medtronic prosthetic annuloplasty ring present in the mitral position. Procedure Date: 08/21/22.  4. The aortic valve is grossly normal. Aortic valve  regurgitation is not visualized. Comparison(s): No significant change from prior study. FINDINGS  Left Ventricle: Left ventricular ejection fraction, by estimation, is 45 to 50%. The left ventricle has mildly decreased function. The left ventricle demonstrates global hypokinesis. Definity contrast agent was given IV to delineate the left ventricular  endocardial borders. The left ventricular internal cavity size was normal in size. There is moderate concentric left ventricular hypertrophy. Left ventricular diastolic function could not be evaluated due to mitral valve repair. Left ventricular diastolic function could not be evaluated. Right Ventricle: The right ventricular size is moderately enlarged. Right vetricular wall thickness was not well visualized. Right ventricular systolic function was not well visualized. Left Atrium: Left atrial size was not well visualized. Right Atrium: Right atrial size was not well visualized. Pericardium: There is no evidence of  pericardial effusion. Mitral Valve: The mitral valve has been repaired/replaced. Trivial mitral valve regurgitation. There is a 30 mm Medtronic prosthetic annuloplasty ring present in the mitral position. Procedure Date: 08/21/22. Tricuspid Valve: The tricuspid valve is grossly normal. Tricuspid valve regurgitation is trivial. No evidence of tricuspid stenosis. Aortic Valve: The aortic valve is grossly normal. Aortic valve regurgitation is not visualized. Pulmonic Valve: The pulmonic valve was not well visualized. Pulmonic valve regurgitation is not visualized. Aorta: Aortic root could not be assessed and the ascending aorta was not well visualized. IAS/Shunts: The interatrial septum was not well visualized.  LEFT VENTRICLE PLAX 2D LVIDd:         5.10 cm LVIDs:         4.50 cm LV PW:         1.60 cm LV IVS:        1.20 cm  MITRAL VALVE MV Area (PHT): 2.36 cm MV Decel Time: 321 msec MR Peak grad: 99.8 mmHg MR Mean grad: 67.5 mmHg MR Vmax:      499.50 cm/s MR Vmean:     392.5 cm/s MV E velocity: 170.00 cm/s Buford Dresser MD Electronically signed by Buford Dresser MD Signature Date/Time: 09/09/2022/4:52:05 PM    Final (Updated)    CT CHEST ABDOMEN PELVIS W CONTRAST  Result Date: 09/08/2022 CLINICAL DATA:  Post code, chest pain, shortness of breath EXAM: CT CHEST, ABDOMEN, AND PELVIS WITH CONTRAST TECHNIQUE: Multidetector CT imaging of the chest, abdomen and pelvis was performed following the standard protocol during bolus administration of intravenous contrast. RADIATION DOSE REDUCTION: This exam was performed according to the departmental dose-optimization program which includes automated exposure control, adjustment of the mA and/or kV according to patient size and/or use of iterative reconstruction technique. CONTRAST:  66m OMNIPAQUE IOHEXOL 350 MG/ML SOLN COMPARISON:  CT chest done on 08/29/2022 the FINDINGS: CT CHEST FINDINGS Cardiovascular: Extensive coronary artery calcifications are seen.  There is metallic density in mitral annulus suggesting prosthetic cardiac valve. Heart is enlarged in size. Small pericardial effusion is present. There is homogeneous enhancement in thoracic aorta. There are no intraluminal filling defects in central pulmonary artery branches. Mediastinum/Nodes: There is no mediastinal hematoma. Lungs/Pleura: There is interval decrease in bilateral pleural effusions. Small patchy infiltrates are seen in both mid and both lower lung fields, more so on the right side. Small right pleural effusion is seen. Few blebs are seen in the left apex. There is no demonstrable pneumothorax. Musculoskeletal: Slightly displaced fractures are seen in the anterior aspects of left third, fourth, fifth and sixth ribs. There are undisplaced fractures in the anterior right third, fourth, fifth and sixth ribs CT ABDOMEN PELVIS FINDINGS Hepatobiliary: No focal  abnormalities are seen. There is no dilation of bile ducts. Surgical clips are seen in gallbladder fossa. Pancreas: No focal abnormalities are seen. Spleen: Unremarkable. Adrenals/Urinary Tract: Left kidney is not seen suggesting left nephrectomy. Adrenals are unremarkable. There is no hydronephrosis in the right kidney. There is 2.1 cm low-density structure in the parapelvic region in right kidney suggesting renal cyst. No follow-up is recommended. There is no right renal or right ureteral stone. Urinary bladder is not distended. Stomach/Bowel: Stomach is unremarkable. Small bowel loops are not dilated. Appendix is not dilated. There is no significant focal wall thickening in colon. There is mild diffuse wall thickening in sigmoid colon and rectum. There is no pericolic stranding. Vascular/Lymphatic: Arterial calcifications are seen. Reproductive: Prostate is not enlarged Other: There is no ascites or pneumoperitoneum. Bilateral inguinal hernias containing fat are seen. Musculoskeletal: Schmorl's nodes are seen in multiple lumbar and thoracic  vertebral bodies. Multiple bilateral rib fractures as described earlier. IMPRESSION: There are recent fractures in the anterior aspect of left and right third, fourth, fifth and sixth ribs. There is no pneumothorax. Major vascular structures in the mediastinum appear intact. Small right pleural effusion. There are patchy infiltrates in mid and lower lung fields suggesting atelectasis/pneumonia. Cardiomegaly.  Coronary artery disease.  Small pericardial effusion. There is no laceration in solid organs. Fatty liver. Right renal cysts. There is mild diffuse wall thickening in rectosigmoid patient may be due to incomplete distention or suggest nonspecific mild colitis. There is no evidence of intestinal obstruction or pneumoperitoneum. There is no hydronephrosis in the right kidney. There is previous left nephrectomy. Other findings as described in the body of the report. Electronically Signed   By: Elmer Picker M.D.   On: 09/08/2022 13:42   CT Head Wo Contrast  Result Date: 09/08/2022 CLINICAL DATA:  Anoxic brain damage cpr EXAM: CT HEAD WITHOUT CONTRAST TECHNIQUE: Contiguous axial images were obtained from the base of the skull through the vertex without intravenous contrast. RADIATION DOSE REDUCTION: This exam was performed according to the departmental dose-optimization program which includes automated exposure control, adjustment of the mA and/or kV according to patient size and/or use of iterative reconstruction technique. COMPARISON:  08/22/2022 FINDINGS: Brain: Mild atrophy. No evidence of acute infarction, hemorrhage, hydrocephalus, extra-axial collection or mass lesion/mass effect. Vascular: Atherosclerotic and physiologic intracranial calcifications. Skull: Normal. Negative for fracture or focal lesion. Sinuses/Orbits: No acute finding. Other: None IMPRESSION: 1. No acute intracranial findings. 2. Mild atrophy. Electronically Signed   By: Lucrezia Europe M.D.   On: 09/08/2022 13:08   DG Chest  Portable 1 View  Result Date: 09/08/2022 CLINICAL DATA:  CPR EXAM: PORTABLE CHEST 1 VIEW COMPARISON:  08/21/2022, 09/02/2022 FINDINGS: Patient is slightly rotated. Stable cardiomegaly. Prosthetic cardiac valve. Pulmonary vascular congestion. Perihilar and bibasilar interstitial prominence. No focal airspace consolidation, pleural effusion, or pneumothorax. No acute bony findings are evident. IMPRESSION: Cardiomegaly with pulmonary vascular congestion and mild interstitial edema. Electronically Signed   By: Davina Poke D.O.   On: 09/08/2022 12:14   DG Chest 2 View  Result Date: 09/02/2022 CLINICAL DATA:  Mitral valve prosthesis, chest pain, shortness of breath EXAM: CHEST - 2 VIEW COMPARISON:  08/29/2022 FINDINGS: Transverse diameter of heart is increased. There is prosthetic mitral valve. There is interval decrease in pulmonary vascular congestion. There is interval clearing of infiltrates in right upper and both lower lung fields suggesting resolving pneumonia or resolving asymmetric pulmonary edema. There is blunting of lateral CP angles. There is no pneumothorax. IMPRESSION: There is  interval decrease in pulmonary vascular congestion and pulmonary edema. There is interval clearing of infiltrates in right upper and both lower lung fields. There are no new focal infiltrates. Small bilateral pleural effusions. Electronically Signed   By: Elmer Picker M.D.   On: 09/02/2022 15:11   CT Angio Chest PE W/Cm &/Or Wo Cm  Result Date: 08/29/2022 CLINICAL DATA:  Chest pain and shortness of breath. Chest pain is pleuritic. Possible pneumonia. Hypoxia. Mitral valve replacement 8 days ago. The chest pain is pleuritic. EXAM: CT ANGIOGRAPHY CHEST WITH CONTRAST TECHNIQUE: Multidetector CT imaging of the chest was performed using the standard protocol during bolus administration of intravenous contrast. Multiplanar CT image reconstructions and MIPs were obtained to evaluate the vascular anatomy. RADIATION  DOSE REDUCTION: This exam was performed according to the departmental dose-optimization program which includes automated exposure control, adjustment of the mA and/or kV according to patient size and/or use of iterative reconstruction technique. CONTRAST:  55m OMNIPAQUE IOHEXOL 350 MG/ML SOLN COMPARISON:  Chest radiographs obtained earlier today. Chest CTA dated 02/11/2021. FINDINGS: Cardiovascular: Stable enlarged heart. Minimal pericardial fluid with a maximum thickness of 5 mm. Atheromatous calcifications, including the coronary arteries and aorta. Normally opacified pulmonary arteries with no pulmonary arterial filling defects seen. Mediastinum/Nodes: No enlarged mediastinal, hilar, or axillary lymph nodes. Thyroid gland, trachea, and esophagus demonstrate no significant findings. Lungs/Pleura: Small to moderate-sized right pleural effusion with multiple loculations. Small left pleural effusion. Bilateral lower lobe atelectasis, greater on the right. Small amount of atelectasis in the lingula and right middle lobe and in the posterior aspects of both upper lobes. No separate airspace consolidation on either side. Mild left apical paraseptal bullous changes. Upper Abdomen: Cholecystectomy clips. Two small calcified granulomata in the spleen. Musculoskeletal: Mild thoracic and lower cervical spine degenerative changes. These include Schmorl's nodes at multiple levels. Tiny amount of air in the pectoralis major muscle on the right. Interval oval, low medium density mass or fluid collection within and/or between the pectoralis muscles laterally on the right measuring approximately 6.6 x 1.9 cm and 13 Hounsfield units in density on image number 103/5. There is also a small surgical clip the posterior aspect of this process on image number 89/5 and mild irregular underlying pleural thickening. Review of the MIP images confirms the above findings. IMPRESSION: 1. No pulmonary emboli. 2. Interval right anterolateral  chest wall postsurgical changes including a small amount postoperative air in the pectoralis musculature on the right as well as a probable postoperative hematoma within the pectoralis musculature on the right. This is compatible with changes related to the patient's recent mitral valve replacement. 3. Interval small to moderate-sized right pleural effusion with multiple loculations. 4. Interval small left pleural effusion. 5. Bilateral atelectasis. 6. Stable cardiomegaly. 7. Calcific coronary artery and aortic atherosclerosis. 8. Mild left apical paraseptal bullous changes. Aortic Atherosclerosis (ICD10-I70.0) and Emphysema (ICD10-J43.9). Electronically Signed   By: SClaudie ReveringM.D.   On: 08/29/2022 17:48   DG Chest 2 View  Result Date: 08/29/2022 CLINICAL DATA:  Chest pain EXAM: CHEST - 2 VIEW COMPARISON:  08/25/2022, 08/23/2022 FINDINGS: Mitral valve prosthesis. Removal of previously noted left-sided central venous catheter. Cardiomegaly with central congestion. Interim patchy bilateral lower lung consolidations and mild airspace disease in the medial right upper lobe. Small bilateral pleural effusions. No visible pneumothorax. IMPRESSION: 1. Cardiomegaly with central vascular congestion and small bilateral pleural effusions. 2. Interim development of patchy bilateral lower lung consolidations and mild airspace disease in the medial right upper lobe, suspicious for  bilateral pneumonia. Electronically Signed   By: Donavan Foil M.D.   On: 08/29/2022 15:24   DG Chest Port 1 View  Result Date: 08/25/2022 CLINICAL DATA:  Shortness of breath. EXAM: PORTABLE CHEST 1 VIEW COMPARISON:  August 23, 2022. FINDINGS: Stable cardiomegaly. Left lung is clear. Mild right basilar atelectasis or infiltrate is again noted and grossly stable. Bony thorax is unremarkable. Left internal jugular dialysis catheter is unchanged. IMPRESSION: Grossly stable mild right basilar atelectasis or infiltrate. Electronically Signed    By: Marijo Conception M.D.   On: 08/25/2022 08:27   DG CHEST PORT 1 VIEW  Result Date: 08/23/2022 CLINICAL DATA:  Respiratory failure EXAM: PORTABLE CHEST 1 VIEW COMPARISON:  August 22, 2022 FINDINGS: Support apparatus including a PA catheter, a left subclavian line, and chest tubes are stable. No pneumothorax. Stable cardiomegaly. The hila and mediastinum are unchanged. Mild opacity in the right base is more pronounced in the interval. No overt edema. No other interval changes. IMPRESSION: 1. Support apparatus as above. 2. Mild opacity in the right base is more pronounced in the interval. This could represent atelectasis or developing infiltrate. Recommend attention on follow-up. Electronically Signed   By: Dorise Bullion III M.D.   On: 08/23/2022 08:25   CT HEAD WO CONTRAST (5MM)  Result Date: 08/22/2022 CLINICAL DATA:  Stroke follow-up EXAM: CT HEAD WITHOUT CONTRAST TECHNIQUE: Contiguous axial images were obtained from the base of the skull through the vertex without intravenous contrast. RADIATION DOSE REDUCTION: This exam was performed according to the departmental dose-optimization program which includes automated exposure control, adjustment of the mA and/or kV according to patient size and/or use of iterative reconstruction technique. COMPARISON:  08/21/2022 head CT and CTA FINDINGS: Brain: No new hypodensity to suggest evolving infarct, with particular attention paid to the anterior left frontal lobe area identified on the CT perfusion. No evidence of acute infarction, hemorrhage, mass, mass effect, or midline shift. No hydrocephalus or extra-axial fluid collection. Vascular: No hyperdense vessel. Atherosclerotic calcifications in the intracranial carotid and vertebral arteries. Skull: Normal. Negative for fracture or focal lesion. Sinuses/Orbits: No acute finding. Other: The mastoid air cells are well aerated. IMPRESSION: No acute intracranial process. No new hypodensity to suggest evolving  infarct. Electronically Signed   By: Merilyn Baba M.D.   On: 08/22/2022 22:27   DG CHEST PORT 1 VIEW  Result Date: 08/22/2022 CLINICAL DATA:  Central line placement. EXAM: PORTABLE CHEST 1 VIEW COMPARISON:  Chest x-ray 08/22/2022 FINDINGS: Endotracheal tube has been removed. Right-sided chest tube is again seen. Swan-Ganz catheter tip at the level of the main pulmonary artery. Left-sided central venous catheter tip in the distal SVC. Cardiomediastinal silhouette is enlarged unchanged. There is no focal lung infiltrate, pleural effusion or pneumothorax. No acute fractures are seen. IMPRESSION: 1. Endotracheal tube has been removed. 2. Swan-Ganz catheter tip at the level of the main pulmonary artery. Electronically Signed   By: Ronney Asters M.D.   On: 08/22/2022 18:51   ECHOCARDIOGRAM COMPLETE BUBBLE STUDY  Result Date: 08/22/2022    ECHOCARDIOGRAM REPORT   Patient Name:   Al[facility] Date of Exam: 08/22/2022 Medical Rec #:  [id number]       Height:       70.0 in Accession #:    [id number]      Weight:       224.9 lb Date of Birth:  Apr 25, 1966        BSA:          2.194  m Patient Age:    17 years        BP:           72/53 mmHg Patient Gender: M               HR:           81 bpm. Exam Location:  Inpatient Procedure: 2D Echo, Cardiac Doppler, Color Doppler and Saline Contrast Bubble            Study Indications:    Stroke 434.91 / I63.9  History:        Patient has prior history of Echocardiogram examinations, most                 recent 08/21/2022. CHF; Risk Factors:Sleep Apnea, Hypertension,                 Dyslipidemia and GERD.                  Mitral Valve: prosthetic annuloplasty ring valve is present in                 the mitral position.  Sonographer:    Bernadene Person RDCS Referring Phys: [id number] [place]  1. Post MV repair.  2. Left ventricular ejection fraction, by estimation, is 45 to 50%. The left ventricle has mildly decreased function. The left ventricle demonstrates  global hypokinesis. There is mild left ventricular hypertrophy. Left ventricular diastolic parameters are consistent with Grade II diastolic dysfunction (pseudonormalization). There is the interventricular septum is flattened in systole and diastole, consistent with right ventricular pressure and volume overload.  3. Right ventricular systolic function is mildly reduced. The right ventricular size is moderately enlarged. There is moderately elevated pulmonary artery systolic pressure. The estimated right ventricular systolic pressure is 54.4 mmHg.  4. Left atrial size was severely dilated.  5. Right atrial size was severely dilated.  6. The mitral valve has been repaired/replaced. Trivial mitral valve regurgitation. No evidence of mitral stenosis. Moderate mitral annular calcification. There is a prosthetic annuloplasty ring present in the mitral position.  7. Tricuspid valve regurgitation is severe.  8. The aortic valve is normal in structure. Aortic valve regurgitation is not visualized. No aortic stenosis is present.  9. The inferior vena cava is dilated in size with <50% respiratory variability, suggesting right atrial pressure of 15 mmHg. Conclusion(s)/Recommendation(s): No intracardiac source of embolism detected on this transthoracic study. Consider a transesophageal echocardiogram to exclude cardiac source of embolism if clinically indicated. FINDINGS  Left Ventricle: Left ventricular ejection fraction, by estimation, is 45 to 50%. The left ventricle has mildly decreased function. The left ventricle demonstrates global hypokinesis. The left ventricular internal cavity size was normal in size. There is  mild left ventricular hypertrophy. The interventricular septum is flattened in systole and diastole, consistent with right ventricular pressure and volume overload. Left ventricular diastolic parameters are consistent with Grade II diastolic dysfunction  (pseudonormalization). Right Ventricle: The right  ventricular size is moderately enlarged. No increase in right ventricular wall thickness. Right ventricular systolic function is mildly reduced. There is moderately elevated pulmonary artery systolic pressure. The tricuspid regurgitant velocity is 2.96 m/s, and with an assumed right atrial pressure of 15 mmHg, the estimated right ventricular systolic pressure is 92.0 mmHg. Left Atrium: Left atrial size was severely dilated. Right Atrium: Right atrial size was severely dilated. Pericardium: There is no evidence of pericardial effusion. Mitral Valve: The mitral valve has been repaired/replaced. Moderate mitral annular  calcification. Trivial mitral valve regurgitation. There is a prosthetic annuloplasty ring present in the mitral position. No evidence of mitral valve stenosis. Tricuspid Valve: The tricuspid valve is normal in structure. Tricuspid valve regurgitation is severe. No evidence of tricuspid stenosis. Aortic Valve: The aortic valve is normal in structure. Aortic valve regurgitation is not visualized. No aortic stenosis is present. Pulmonic Valve: The pulmonic valve was normal in structure. Pulmonic valve regurgitation is mild. No evidence of pulmonic stenosis. Aorta: The aortic root is normal in size and structure. Venous: The inferior vena cava is dilated in size with less than 50% respiratory variability, suggesting right atrial pressure of 15 mmHg. IAS/Shunts: No atrial level shunt detected by color flow Doppler. Agitated saline contrast was given intravenously to evaluate for intracardiac shunting. Additional Comments: Post MV repair. A device lead is visualized in the right ventricle.  LEFT VENTRICLE PLAX 2D LVIDd:         6.00 cm      Diastology LVIDs:         4.70 cm      LV e' medial:    6.60 cm/s LV PW:         1.20 cm      LV E/e' medial:  20.3 LV IVS:        1.20 cm      LV e' lateral:   11.20 cm/s LVOT diam:     2.10 cm      LV E/e' lateral: 12.0 LV SV:         71 LV SV Index:   33 LVOT Area:      3.46 cm  LV Volumes (MOD) LV vol d, MOD A2C: 118.0 ml LV vol d, MOD A4C: 154.0 ml LV vol s, MOD A2C: 52.1 ml LV vol s, MOD A4C: 50.5 ml LV SV MOD A2C:     65.9 ml LV SV MOD A4C:     154.0 ml LV SV MOD BP:      86.7 ml RIGHT VENTRICLE RV S prime:     11.00 cm/s TAPSE (M-mode): 1.5 cm LEFT ATRIUM             Index        RIGHT ATRIUM           Index LA diam:        4.80 cm 2.19 cm/m   RA Area:     26.50 cm LA Vol (A2C):   84.5 ml 38.52 ml/m  RA Volume:   86.90 ml  39.61 ml/m LA Vol (A4C):   75.7 ml 34.50 ml/m LA Biplane Vol: 81.1 ml 36.97 ml/m  AORTIC VALVE             PULMONIC VALVE LVOT Vmax:   123.00 cm/s PR End Diast Vel: 7.40 msec LVOT Vmean:  77.400 cm/s LVOT VTI:    0.206 m  AORTA Ao Root diam: 4.00 cm Ao Asc diam:  3.90 cm MITRAL VALVE                TRICUSPID VALVE MV Area (PHT): 3.65 cm     TR Peak grad:   35.0 mmHg MV Decel Time: 208 msec     TR Vmax:        296.00 cm/s MV E velocity: 134.00 cm/s MV A velocity: 73.20 cm/s   SHUNTS MV E/A ratio:  1.83         Systemic VTI:  0.21 m  Systemic Diam: 2.10 cm Candee Furbish MD Electronically signed by Candee Furbish MD Signature Date/Time: 08/22/2022/2:00:56 PM    Final    ECHO INTRAOPERATIVE TEE  Result Date: 08/22/2022  *INTRAOPERATIVE TRANSESOPHAGEAL REPORT *  Patient Name:   Albert [facility] Date of Exam: 08/21/2022 Medical Rec #:  [id number]       Height:       70.0 in Accession #:    [id number]      Weight:       206.0 lb Date of Birth:  [birth year]        BSA:          2.11 m Patient Age:    47 years        BP:           122/81 mmHg Patient Gender: M               HR:           77 bpm. Exam Location:  Inpatient Transesophogeal exam was perform intraoperatively during surgical procedure. Patient was closely monitored under general anesthesia during the entirety of examination. Indications:     mitral regurgitation Sonographer:     Johny Chess RDCS Performing Phys: [id number] Coralie Common Diagnosing Phys: Albertha Ghee MD  Complications: No known complications during this procedure. POST-OP IMPRESSIONS _ Mitral Valve: No stenosis present. There is trace regurgitation. The gradient recorded across the prosthetic valve is within the expected range. The mean PG across the repaired mitral valve is 3 mmHg.The mitral valve is status post repair with an annuloplasty ring. PRE-OP FINDINGS  Left Ventricle: The left ventricle has low normal systolic function, with an ejection fraction of 50-55%. The cavity size was normal. There is mildly increased left ventricular wall thickness. There is mild concentric left ventricular hypertrophy. Right Ventricle: The right ventricle has normal systolic function. The cavity was normal. There is no increase in right ventricular wall thickness. Left Atrium: Left atrial size was dilated. No left atrial/left atrial appendage thrombus was detected. Right Atrium: Right atrial size was dilated. Interatrial Septum: No atrial level shunt detected by color flow Doppler. Pericardium: There is no evidence of pericardial effusion. Mitral Valve: The mitral valve is dilated. Mitral valve regurgitation is severe by color flow Doppler. The MR jet is centrally-directed. There is No evidence of mitral stenosis. Mitral valve annulus is dilated with an anterior-posterior dimension of 4.12cm. Diameter in the commissural view is 3.76cm. Regurgitant jet is severe with a dense signal on CWD. No flow reversal is noted in pulmonary veins. Tricuspid Valve: The tricuspid valve was normal in structure. Tricuspid valve regurgitation is moderate by color flow Doppler. The jet is directed centrally. No evidence of tricuspid stenosis is present. Aortic Valve: The aortic valve is tricuspid Aortic valve regurgitation was not visualized by color flow Doppler. There is no stenosis of the aortic valve. Pulmonic Valve: The pulmonic valve was normal in structure. Pulmonic valve regurgitation is not visualized by color flow Doppler. Aorta: The  aortic root, ascending aorta and aortic arch are normal in size and structure. +-------------+-----------++ +---------------+-----------++ MR Peak grad:79.2 mmHg   TRICUSPID VALVE            +-------------+-----------++ +---------------+-----------++ MR Mean grad:49.0 mmHg   TR Peak grad:  32.5 mmHg   +-------------+-----------++ +---------------+-----------++ MR Vmax:     445.00 cm/s TR Vmax:       285.00 cm/s +-------------+-----------++ +---------------+-----------++ MR Vmean:    324.0 cm/s  +-------------+-----------++  Albertha Ghee MD Electronically  signed by Albertha Ghee MD Signature Date/Time: 08/22/2022/11:46:22 AM    Final    DG Chest Port 1 View  Result Date: 08/22/2022 CLINICAL DATA:  Post MVR EXAM: PORTABLE CHEST 1 VIEW COMPARISON:  Portable exam 0522 hours compared to 08/21/2022 FINDINGS: Tip of endotracheal tube projects 2.5 cm above carina. Nasogastric tube extends into stomach. RIGHT thoracostomy tubes present. RIGHT jugular Swan-Ganz catheter with tip projecting over RIGHT pulmonary artery. Epicardial pacing wires noted. Enlargement of cardiac silhouette post MVR. Bibasilar opacities likely atelectasis greater on LEFT. No pleural effusion or pneumothorax. IMPRESSION: Stable postoperative changes as above. Electronically Signed   By: Lavonia Dana M.D.   On: 08/22/2022 08:31   EEG adult  Result Date: 08/21/2022 Lora Havens, MD     08/21/2022  8:45 PM Patient Name: Albert Eaton MRN: [id number] Epilepsy Attending: Lora Havens Referring Physician/Provider: Candee Furbish, MD Date: 08/21/2022 Duration: 21.50 mins Patient history: 56yo M had myoclonus of all 4 ext then became unresponsive and stopped breathing. EEG to evaluate for seizure Level of alertness:  comatose AEDs during EEG study: None Technical aspects: This EEG study was done with scalp electrodes positioned according to the 10-20 International system of electrode placement. Electrical  activity was reviewed with band pass filter of 1-70Hz , sensitivity of 7 uV/mm, display speed of 47m/sec with a 60Hz  notched filter applied as appropriate. EEG data were recorded continuously and digitally stored.  Video monitoring was available and reviewed as appropriate. Description: EEG showed continuous generalized mixed frequencies with predominantly 3 to 6 Hz theta-delta slowing admixed with intermittent 8-9Hz  generalized alpha activity and 13-15hz  frontocentral beta activity. Hyperventilation and photic stimulation were not performed.   ABNORMALITY - Continuous slow, generalized IMPRESSION: This study is suggestive of severe diffuse encephalopathy, nonspecific etiology but could be related to sedation. No seizures or epileptiform discharges were seen throughout the recording. Please note that lack of epileptiform activity on interictal EEG does not exclude the diagnosis of epilepsy. PLora Havens  DG CHEST PORT 1 VIEW  Result Date: 08/21/2022 CLINICAL DATA:  Ventilator dependence. EXAM: PORTABLE CHEST 1 VIEW COMPARISON:  Earlier same day FINDINGS: 1815 hours. Endotracheal tube tip is 4.2 cm above the base of the carina. NG tube tip is superimposed on the heart. The course of the NG tube does not follow the airway anatomy suggesting that it tracks down the esophagus. Appearance may be related to high position of the left hemidiaphragm and is unchanged in the interval. Patient does not have a hiatal hernia on prior MR of 10/30/2021. Right IJ pulmonary artery catheter tip is in the right main pulmonary artery. Right chest tube noted with no evidence for right-sided pneumothorax. Drain overlying the inferior heart may be pericardial/mediastinal. Vascular congestion has increased in the interval with probable edema and basilar atelectasis evident. IMPRESSION: 1. Slight progression of vascular congestion and interstitial opacity suggesting edema. 2. NG tube tip is superimposed on the heart. The course of  the NG tube does not follow the airway anatomy suggesting that it tracks down the esophagus. Positioning of the tip may be related to high left hemidiaphragm and is unchanged in the interval. If there is clinical concern for NG tube malposition, administration of a small amount of contrast material through the tube with repeat x-ray may prove helpful. 3. Endotracheal tube tip is 4.2 cm above the base of the carina. 4. Right chest tube without evidence for right pneumothorax. Electronically Signed   By: EVerda CuminsD.  On: 08/21/2022 18:42   CT ANGIO HEAD NECK W WO CM W PERF (CODE STROKE)  Result Date: 08/21/2022 CLINICAL DATA:  Mitral valve replacement today. Acute neurologic deficit postop EXAM: CT ANGIOGRAPHY HEAD AND NECK CT PERFUSION BRAIN TECHNIQUE: Multidetector CT imaging of the head and neck was performed using the standard protocol during bolus administration of intravenous contrast. Multiplanar CT image reconstructions and MIPs were obtained to evaluate the vascular anatomy. Carotid stenosis measurements (when applicable) are obtained utilizing NASCET criteria, using the distal internal carotid diameter as the denominator. Multiphase CT imaging of the brain was performed following IV bolus contrast injection. Subsequent parametric perfusion maps were calculated using RAPID software. RADIATION DOSE REDUCTION: This exam was performed according to the departmental dose-optimization program which includes automated exposure control, adjustment of the mA and/or kV according to patient size and/or use of iterative reconstruction technique. CONTRAST:  157m OMNIPAQUE IOHEXOL 350 MG/ML SOLN COMPARISON:  CT head 08/21/2022 FINDINGS: CTA NECK FINDINGS Aortic arch: Mild atherosclerotic calcification aortic arch. Proximal great vessels widely patent. Right carotid system: Mild atherosclerotic calcification right carotid bulb. Negative for stenosis. Small caliber right internal carotid artery similar to that  seen on the left. Left carotid system: Mild atherosclerotic calcification proximal left internal carotid artery without significant stenosis. Small caliber left internal carotid artery similar to the right side. Vertebral arteries: Both vertebral arteries patent to the basilar without stenosis. Skeleton: No acute skeletal abnormality Other neck: The patient is intubated.  NG tube in the esophagus Upper chest: Right chest tube in place. Small right apical pneumothorax. Gas in the right chest wall from recent surgery. Mild atelectasis in the posterior lung apices bilaterally. Review of the MIP images confirms the above findings CTA HEAD FINDINGS Anterior circulation: Atherosclerotic calcification in the cavernous carotid bilaterally without stenosis. Anterior and middle cerebral arteries patent without stenosis or large vessel occlusion. Negative for aneurysm. Posterior circulation: Both vertebral arteries patent to the basilar without significant stenosis. Mild atherosclerotic calcification distal right vertebral artery without significant stenosis. PICA patent bilaterally. Basilar patent. Mild atherosclerotic calcification in the basilar. Superior cerebellar and posterior cerebral arteries patent bilaterally without stenosis. Venous sinuses: Normal venous enhancement Anatomic variants: None Review of the MIP images confirms the above findings CT Brain Perfusion Findings: ASPECTS: 10 CBF (<30%) Volume: 553mPerfusion (Tmax>6.0s) volume: 1080mismatch Volume: 5mL50mfarction Location:Left frontal lobe predominantly white matter IMPRESSION: 1. CT perfusion positive for a small area of infarct and ischemia left frontal lobe predominately in the white matter. 2. Negative for large vessel occlusion 3. No significant carotid or vertebral artery stenosis in the neck. 4. Postop changes right chest. Small right pneumothorax. Right chest tube in place. 5. These results were called by telephone at the time of interpretation on  08/21/2022 at 6:38 pm to [facility] verb[facility] acknowledged these results. Electronically Signed   By: CharFranchot Gallo.   On: 08/21/2022 18:38   CT HEAD WO CONTRAST (5MM)  Result Date: 08/21/2022 CLINICAL DATA:  Seizure, history of mitral valve replacement EXAM: CT HEAD WITHOUT CONTRAST TECHNIQUE: Contiguous axial images were obtained from the base of the skull through the vertex without intravenous contrast. RADIATION DOSE REDUCTION: This exam was performed according to the departmental dose-optimization program which includes automated exposure control, adjustment of the mA and/or kV according to patient size and/or use of iterative reconstruction technique. COMPARISON:  None Available. FINDINGS: Brain: No acute infarct or hemorrhage. Lateral ventricles and midline structures are unremarkable. No acute extra-axial fluid collections.  No mass effect. Vascular: Atherosclerosis of the internal carotid arteries. No hyperdense vessel. Skull: Normal. Negative for fracture or focal lesion. Sinuses/Orbits: No acute finding. Other: None. IMPRESSION: 1. No acute intracranial process. Electronically Signed   By: Randa Ngo M.D.   On: 08/21/2022 18:12   DG Chest Port 1 View  Result Date: 08/21/2022 CLINICAL DATA:  Status post MVR EXAM: PORTABLE CHEST 1 VIEW COMPARISON:  08/19/2022 FINDINGS: Interval placement of support apparatus including endotracheal tube, esophagogastric tube, right neck pulmonary vascular catheter, tip directed over the main pulmonary artery, as well as bilateral chest and mediastinal drainage tubes. Cardiomegaly with mitral valve annular prosthesis. Probable small left pleural effusion and associated atelectasis or consolidation. Osseous structures unremarkable. IMPRESSION: 1. Interval placement of support apparatus including endotracheal tube, esophagogastric tube, right neck pulmonary vascular catheter as well as bilateral chest and mediastinal drainage tubes. 2.  Cardiomegaly with mitral valve annular prosthesis. 3. Probable small left pleural effusion and associated atelectasis or consolidation. Electronically Signed   By: Delanna Ahmadi M.D.   On: 08/21/2022 13:11   VAS US DOPPLER PRE CABG  Result Date: 08/19/2022 PREOPERATIVE VASCULAR EVALUATION Patient Name:  Albert Eaton  Date of Exam:   08/19/2022 Medical Rec #: [id number]        Accession #:    [id number] Date of Birth: Jun 19, 1966         Patient Gender: M Patient Age:   88 years Exam Location:  [facility] Procedure:      VAS US DOPPLER PRE CABG Referring Phys: Coralie Common --------------------------------------------------------------------------------  Indications:            Preop MVR on 10/26. Risk Factors:           Hypertension, hyperlipidemia, coronary artery disease. Vascular Interventions: Left upper arm AVF 2019. Performing Technologist: Oda Cogan RDMS, RVT  Examination Guidelines: A complete evaluation includes B-mode imaging, spectral Doppler, color Doppler, and power Doppler as needed of all accessible portions of each vessel. Bilateral testing is considered an integral part of a complete examination. Limited examinations for reoccurring indications may be performed as noted.  Right Carotid Findings: +----------+--------+--------+--------+--------+------------------+           PSV cm/sEDV cm/sStenosisDescribeComments           +----------+--------+--------+--------+--------+------------------+ CCA Prox  97      18                                         +----------+--------+--------+--------+--------+------------------+ CCA Distal81      18                      intimal thickening +----------+--------+--------+--------+--------+------------------+ ICA Prox  39      12                      intimal thickening +----------+--------+--------+--------+--------+------------------+ ICA Distal81      34                                          +----------+--------+--------+--------+--------+------------------+ ECA       86      14                                         +----------+--------+--------+--------+--------+------------------+ +----------+--------+-------+----------------+------------+  PSV cm/sEDV cmsDescribe        Arm Pressure +----------+--------+-------+----------------+------------+ Subclavian97             Multiphasic, WNL             +----------+--------+-------+----------------+------------+ +---------+--------+--+--------+--+---------+ VertebralPSV cm/s43EDV cm/s18Antegrade +---------+--------+--+--------+--+---------+ Left Carotid Findings: +----------+--------+--------+--------+--------+------------------+           PSV cm/sEDV cm/sStenosisDescribeComments           +----------+--------+--------+--------+--------+------------------+ CCA Prox  118     17                                         +----------+--------+--------+--------+--------+------------------+ CCA Distal87      15                      intimal thickening +----------+--------+--------+--------+--------+------------------+ ICA Prox  64      20                      intimal thickening +----------+--------+--------+--------+--------+------------------+ ICA Distal81      28                                         +----------+--------+--------+--------+--------+------------------+ ECA       82      13                                         +----------+--------+--------+--------+--------+------------------+ +---------+--------+--+--------+--+---------+ VertebralPSV cm/s46EDV cm/s14Antegrade +---------+--------+--+--------+--+---------+  ABI Findings: +--------+------------------+-----+---------+--------+ Right   Rt Pressure (mmHg)IndexWaveform Comment  +--------+------------------+-----+---------+--------+ Brachial                       triphasic          +--------+------------------+-----+---------+--------+ +--------+------------------+-----+--------+-------------+ Left    Lt Pressure (mmHg)IndexWaveformComment       +--------+------------------+-----+--------+-------------+ Brachial                               AVF upper arm +--------+------------------+-----+--------+-------------+  Right Doppler Findings: +-----------+--------+-----+---------+--------+ Site       PressureIndexDoppler  Comments +-----------+--------+-----+---------+--------+ Brachial                triphasic         +-----------+--------+-----+---------+--------+ Radial                  triphasic         +-----------+--------+-----+---------+--------+ Ulnar                   triphasic         +-----------+--------+-----+---------+--------+ Palmar Arch                      WNL      +-----------+--------+-----+---------+--------+  Left Doppler Findings: +--------+--------+-----+---------+-------------+ Site    PressureIndexDoppler  Comments      +--------+--------+-----+---------+-------------+ Brachial                      AVF upper arm +--------+--------+-----+---------+-------------+ Radial               triphasic              +--------+--------+-----+---------+-------------+ Ulnar  triphasic              +--------+--------+-----+---------+-------------+   Summary: Right Carotid: The extracranial vessels were near-normal with only minimal wall                thickening or plaque. Left Carotid: The extracranial vessels were near-normal with only minimal wall               thickening or plaque. Vertebrals: Bilateral vertebral arteries demonstrate antegrade flow. Right Upper Extremity: Doppler waveforms remain within normal limits with right radial compression. Doppler waveforms remain within normal limits with right ulnar compression. Left Upper Extremity: Doppler waveforms remain within normal limits with left radial  compression. Doppler waveform obliterate with left ulnar compression.  Electronically signed by Deitra Mayo MD on 08/19/2022 at 12:55:30 PM.    Final    DG Chest 2 View  Result Date: 08/19/2022 CLINICAL DATA:  56 year old male with preoperative chest x-ray EXAM: CHEST - 2 VIEW COMPARISON:  04/28/2022 FINDINGS: Cardiomediastinal silhouette unchanged in size and contour. No evidence of central vascular congestion. No interlobular septal thickening. No pneumothorax or pleural effusion. Coarsened interstitial markings, with no confluent airspace disease. No acute displaced fracture. Degenerative changes of the spine. IMPRESSION: No active cardiopulmonary disease. Electronically Signed   By: Corrie Mckusick D.O.   On: 08/19/2022 11:51    Microbiology: Results for orders placed or performed during the E[facility] encounter of 08/29/22  Culture, blood (routine x 2)     Status: None   Collection Time: 08/29/22  4:34 PM   Specimen: BLOOD  Result Value Ref Range Status   Specimen Description   Final    BLOOD RIGHT ANTECUBITAL Performed at [facility], [address] [place], [place] [address]    Special Requests   Final    Blood Culture adequate volume BOTTLES DRAWN AEROBIC AND ANAEROBIC Performed at [facility], [address] [place], [place] [address]    Culture   Final    NO GROWTH 5 DAYS Performed at [facility], [place] [address], [place], [place] [address]    Report Status 09/03/2022 FINAL  Final  Culture, blood (routine x 2)     Status: None   Collection Time: 08/29/22  4:35 PM   Specimen: BLOOD RIGHT FOREARM  Result Value Ref Range Status   Specimen Description   Final    BLOOD RIGHT FOREARM Performed at [facility], [place] [address] [place], [place] [address]    Special Requests   Final    Blood Culture adequate volume BOTTLES DRAWN AEROBIC AND ANAEROBIC Performed at [facility], [address],  [place], [place] [address]    Culture   Final    NO GROWTH 5 DAYS Performed at [facility], [place] [address] [place], [place] [address]    Report Status 09/03/2022 FINAL  Final  Resp Panel by RT-PCR (Flu A&B, Covid) Anterior Nasal Swab     Status: None   Collection Time: 08/29/22  4:36 PM   Specimen: Anterior Nasal Swab  Result Value Ref Range Status   SARS Coronavirus 2 by RT PCR NEGATIVE NEGATIVE Final    Comment: (NOTE) SARS-CoV-2 target nucleic acids are NOT DETECTED.  The SARS-CoV-2 RNA is generally detectable in upper respiratory specimens during the acute phase of infection. The lowest concentration of SARS-CoV-2 viral copies this assay can detect is 138 copies/mL. A negative result does not preclude SARS-Cov-2 infection and should not be used as  the sole basis for treatment or other patient management decisions. A negative result may occur with  improper specimen collection/handling, submission of specimen other than nasopharyngeal swab, presence of viral mutation(s) within the areas targeted by this assay, and inadequate number of viral copies(<138 copies/mL). A negative result must be combined with clinical observations, patient history, and epidemiological information. The expected result is Negative.  Fact Sheet for Patients:  EntrepreneurPulse.com.au  Fact Sheet for Healthcare Providers:  IncredibleEmployment.be  This test is no t yet approved or cleared by the [place] FDA and  has been authorized for detection and/or diagnosis of SARS-CoV-2 by FDA under an Emergency Use Authorization (EUA). This EUA will remain  in effect (meaning this test can be used) for the duration of the COVID-19 declaration under Section 564(b)(1) of the Act, 21 U.S.C.section 360bbb-3(b)(1), unless the authorization is terminated  or revoked sooner.       Influenza A by PCR NEGATIVE NEGATIVE Final   Influenza B by PCR NEGATIVE NEGATIVE Final     Comment: (NOTE) The Xpert Xpress SARS-CoV-2/FLU/RSV plus assay is intended as an aid in the diagnosis of influenza from Nasopharyngeal swab specimens and should not be used as a sole basis for treatment. Nasal washings and aspirates are unacceptable for Xpert Xpress SARS-CoV-2/FLU/RSV testing.  Fact Sheet for Patients: EntrepreneurPulse.com.au  Fact Sheet for Healthcare Providers: IncredibleEmployment.be  This test is not yet approved or cleared by the [place] FDA and has been authorized for detection and/or diagnosis of SARS-CoV-2 by FDA under an Emergency Use Authorization (EUA). This EUA will remain in effect (meaning this test can be used) for the duration of the COVID-19 declaration under Section 564(b)(1) of the Act, 21 U.S.C. section 360bbb-3(b)(1), unless the authorization is terminated or revoked.  Performed at [facility], [address] [place], [place] [address]     Labs: CBC: Recent Labs  Lab 09/08/22 1138 09/08/22 1147 09/10/22 0242 09/10/22 1550 09/11/22 0211 09/12/22 0452 09/13/22 0709 09/14/22 0815  WBC 9.3   < > 11.1*  --  8.2 8.0 9.0 9.1  NEUTROABS 5.2  --   --   --   --  5.7 6.3  --   HGB 7.7*   < > 7.2* 7.5* 7.5* 7.3* 8.2* 8.0*  HCT 24.2*   < > 21.9* 23.1* 23.6* 21.9* 25.1* 24.6*  MCV 100.0   < > 98.2  --  99.6 95.6 96.5 96.1  PLT 269   < > 291  --  357 330 352 367   < > = values in this interval not displayed.   Basic Metabolic Panel: Recent Labs  Lab 09/08/22 1138 09/08/22 1147 09/10/22 0242 09/11/22 0211 09/12/22 0452 09/13/22 0709 09/14/22 0815  NA 141   < > 133* 136 134* 134* 130*  K 3.5   < > 5.7* 4.5 4.7 4.6 4.4  CL 102   < > 93* 94* 96* 92* 88*  CO2 24   < > 24 25 23 26 23   GLUCOSE 158*   < > 114* 111* 111* 105* 95  BUN 19   < > 56* 39* 58* 48* 66*  CREATININE 6.67*   < > 11.09* 8.10* 10.56* 9.36* 11.49*  CALCIUM 10.0   < > 9.0 8.1* 8.1* 8.6* 8.8*  MG 1.7  --    --  1.8 1.8 1.7  --   PHOS  --   --   --  4.1 4.6 5.0* 6.1*   < > = values in  this interval not displayed.   Liver Function Tests: Recent Labs  Lab 09/08/22 1138 09/09/22 0147 09/11/22 0211 09/12/22 0452 09/13/22 0709 09/14/22 0815  AST 80* 57* 20 11* 12*  --   ALT 57* 49* 31 21 17   --   ALKPHOS 88 85 90 79 79  --   BILITOT 0.2* 0.2* 0.3 0.4 0.3  --   PROT 6.6 7.1 7.2 6.2* 7.1  --   ALBUMIN 3.0* 3.1* 3.2* 2.8* 3.0* 2.8*   CBG: Recent Labs  Lab 09/08/22 1215  GLUCAP 107*    Discharge time spent: greater than 30 minutes.  Signed: Patrecia Pour, MD [facility] 09/14/2022

## 2022-09-15 ENCOUNTER — Other Ambulatory Visit: Payer: Self-pay

## 2022-09-15 ENCOUNTER — Telehealth: Payer: Self-pay | Admitting: *Deleted

## 2022-09-15 DIAGNOSIS — N186 End stage renal disease: Secondary | ICD-10-CM

## 2022-09-15 DIAGNOSIS — I469 Cardiac arrest, cause unspecified: Secondary | ICD-10-CM

## 2022-09-15 DIAGNOSIS — I63442 Cerebral infarction due to embolism of left cerebellar artery: Secondary | ICD-10-CM

## 2022-09-15 NOTE — Patient Outreach (Signed)
Albert Eaton [birth year] [id number]   Primary Care Provider: [name], NPwith [place]  Follow up:  Post hospital extreme high risk transitioned over the weekend.  Referral request for Care Coordination for new high risk diagnosis made.  [name], RN BSN [place]  [phone] business mobile phone Toll free office [phone]  *[place]  [phone] Fax number: [phone] [name]

## 2022-09-15 NOTE — Progress Notes (Unsigned)
Office Visit    Patient Name: Albert Eaton Date of Encounter: 09/15/2022  PCP:  [name], NP   [place]  Cardiologist:  [name], MD  Advanced Practice Provider:  No care team member to display Electrophysiologist:  Vickie Epley, MD   HPI    Albert Eaton is a 56 y.o. male with past medical history significant for ESRD on HD M, W, F, chronic HFpEF, Severe MR status post mitral valve repair 10/23, PAF on amiodarone and Eliquis, OSA not tolerating CPAP, hyper thyroidism, hypertension, hyperlipidemia presents today for hospital follow-up visit.  The patient presented to the ED 11/13 after cardiac arrest during dialysis.  ROSC achieved.  The patient did not recall any of the events during the CODE BLUE at the HD center.  Reported he had a normal day and felt normal earlier that morning before going to HD.  Halfway into HD after removing about 2 L of fluid patient became unresponsive with GCS of 3, HD staff could not feel radial pulses/carotid pulses and CODE BLUE was called.  Monitor showed however the patient had SVT versus A-fib.  CPR was started right away and 2 rounds of epinephrine were given and 1 dose of calcium gluconate with patient recovering pulses and consciousness.  During episode patient was also found to have agonal breathing 4-5 times per minute.  No seizure-like activity.  CT angio showed multiple fractured ribs and small bilateral pleural effusions otherwise no significant abnormalities.  EKG showed normal sinus rhythm with borderline prolonged QTc.  Troponin trending 50 > 600.  Lactic acid 5.0> 2.4.  Patient evaluated by PCCM, recommended admission due to progressive care under hospital service.  LHC showed no change in coronary anatomy.  Recommended consideration to EP and Dr. Marlowe Kays placed a Zio patch.  Suspicion that it was a PEA arrest related to electrolyte shifts during dialysis.  Today, he ***  Past Medical History     Past Medical History:  Diagnosis Date   Anemia    Aortic atherosclerosis (HCC)    Arthritis    Asthma    Cancer ([place])    renal cyst   Chronic diastolic CHF (congestive heart failure) ([place]) 01/06/2017   Echo 7/16 [facility] [place], [name]) Mild AI, mild LAE, mild concentric LVH, EF 55, normal wall motion, mild to moderate MR, mild PI, RVSP 55 // Echo 10/09/16 ([facility]):  Moderate LVH, grade 2 diastolic dysfunction, mild MR, moderate LAE    Esophagitis    ESRD (end stage renal disease) on dialysis [facility])    Dialysis Mon Wed Fri   GERD (gastroesophageal reflux disease)    Gout    no current problems   Hepatitis    Hep B   HIV (human immunodeficiency virus infection) ([place])    HLD (hyperlipidemia)    Hypertension    Hypothyroidism    Mitral regurgitation    Neuropathy    Sleep apnea    uses c-pap   Wears glasses    Wears partial dentures    Past Surgical History:  Procedure Laterality Date   AV FISTULA PLACEMENT Left 07/02/2018   Procedure: Creation of Left arm BRACHIOBASILIC ARTERIOVENOUS  FISTULA;  Surgeon: Marty Heck, MD;  Location: [facility];  Service: Vascular;  Laterality: Left;   [place] Left 08/27/2018   Procedure: Left arm BRACHIOBASILIC VEIN TRANSPOSITION SECOND STAGE;  Surgeon: Marty Heck, MD;  Location: [facility];  Service: Vascular;  Laterality: Left;  BUBBLE STUDY  09/03/2020   Procedure: BUBBLE STUDY;  Surgeon: Fay Records, MD;  Location: [facility];  Service: Cardiovascular;;   BUBBLE STUDY  06/03/2022   Procedure: BUBBLE STUDY;  Surgeon: Elouise Munroe, MD;  Location: [facility];  Service: Cardiology;;   CAPD INSERTION N/A 05/30/2021   Procedure: LAPAROSCOPIC INSERTION CONTINUOUS AMBULATORY PERITONEAL DIALYSIS  (CAPD) CATHETER;  Surgeon: [name], MD;  Location: [facility];  Service: Vascular;  Laterality: N/A;   CAPD REMOVAL N/A 02/13/2022   Procedure: PERITONEAL DIALYSIS CATHETER REMOVAL;  Surgeon: [name],  MD;  [facility];  Service: Vascular;  Laterality: N/A;   CHOLECYSTECTOMY     COLONOSCOPY W/ BIOPSIES AND POLYPECTOMY     GIVENS CAPSULE STUDY N/A 03/01/2018   Procedure: GIVENS CAPSULE STUDY;  Surgeon: Jerene Bears, MD;  Location: [facility];  Service: Gastroenterology;  Laterality: N/A;   HERNIA REPAIR     As baby   LEFT HEART CATH AND CORONARY ANGIOGRAPHY N/A 09/11/2022   Procedure: LEFT HEART CATH AND CORONARY ANGIOGRAPHY;  Surgeon: Jettie Booze, MD;  Location: [facility];  Service: Cardiovascular;  Laterality: N/A;   MITRAL VALVE REPAIR N/A 08/21/2022   Procedure: MINIMALLY INVASIVE MITRAL VALVE REPAIR USING RING ANNULOPLASTY SIMULUS 15m;  Surgeon: WCoralie Common MD;  Location[place] [facility]  Service: Open Heart Surgery;  Laterality: N/A;   MULTIPLE TOOTH EXTRACTIONS     NEPHRECTOMY Left 02/18/2021   NOSE SURGERY     RENAL BIOPSY     RIGHT/LEFT HEART CATH AND CORONARY ANGIOGRAPHY N/A 09/04/2021   Procedure: RIGHT/LEFT HEART CATH AND CORONARY ANGIOGRAPHY;  Surgeon: MBurnell Blanks MD;  [facility];  Service: Cardiovascular;  Laterality: N/A;   RIGHT/LEFT HEART CATH AND CORONARY ANGIOGRAPHY N/A 08/01/2022   Procedure: RIGHT/LEFT HEART CATH AND CORONARY ANGIOGRAPHY;  Surgeon: CSherren Mocha MD;  [facility];  Service: Cardiovascular;  Laterality: N/A;   TEE WITHOUT CARDIOVERSION N/A 09/03/2020   Procedure: TRANSESOPHAGEAL ECHOCARDIOGRAM (TEE);  Surgeon: RFay Records MD;  [facility]  Service: Cardiovascular;  Laterality: N/A;   TEE WITHOUT CARDIOVERSION N/A 06/03/2022   Procedure: TRANSESOPHAGEAL ECHOCARDIOGRAM (TEE);  Surgeon: AElouise Munroe MD;  [facility]  Service: Cardiology;  Laterality: N/A;   TEE WITHOUT CARDIOVERSION N/A 08/21/2022   Procedure: TRANSESOPHAGEAL ECHOCARDIOGRAM (TEE);  Surgeon: WCoralie Common MD;  [facility]  Service: Open Heart Surgery;  Laterality: N/A;   UPPER GI  ENDOSCOPY  04/05/2021    Allergies  Allergies  Allergen Reactions   Ace Inhibitors Cough and Other (See Comments)    EKGs/Labs/Other Studies Reviewed:   The following studies were reviewed today:  Cardiac catheterization 09/11/2022   Left Anterior Descending  Vessel is large. The LAD wraps around the LV apex. There is moderate diffuse 40 to 50% proximal LAD stenosis. The mid and distal LAD have no obstructive disease.  Prox LAD lesion is 50% stenosed.  Mid LAD lesion is 50% stenosed.    Left Circumflex  Vessel is large. The vessel exhibits minimal luminal irregularities. The circumflex is dominant. There is mild nonobstructive plaquing throughout. There are no high-grade stenoses throughout the circumflex, obtuse marginal, or posterolateral branches.    Second Obtuse Marginal Branch  2nd Mrg lesion is 30% stenosed.    Right Coronary Artery  Vessel is moderate in size. Nondominant vessel. Mild nonobstructive proximal vessel disease of about 40%.  Prox RCA lesion is 40% stenosed.    Intervention  No interventions have been documented.   Left Heart  Left Ventricle LV end diastolic pressure is mildly elevated.  Aortic Valve There is no aortic valve stenosis.   Coronary Diagrams  Diagnostic Dominance: Left  Intervention    EKG:  EKG is *** ordered today.  The ekg ordered today demonstrates ***  Recent Labs: 02/20/2022: NT-Pro BNP 22,536 09/08/2022: B Natriuretic Peptide 2,787.4; TSH 2.868 09/13/2022: ALT 17; Magnesium 1.7 09/14/2022: BUN 66; Creatinine, Ser 11.49; Hemoglobin 8.0; Platelets 367; Potassium 4.4; Sodium 130  Recent Lipid Panel    Component Value Date/Time   CHOL 113 04/17/2022 0345   CHOL 126 05/01/2021 1055   TRIG 131 04/17/2022 0345   HDL 35 (L) 04/17/2022 0345   HDL 40 05/01/2021 1055   CHOLHDL 3.2 04/17/2022 0345   VLDL 64 (H) 09/04/2021 0347   LDLCALC 57 04/17/2022 0345   LDLDIRECT 23 08/22/2022 0901    Risk Assessment/Calculations:   {Does this patient have ATRIAL FIBRILLATION?:[phone]}  Home Medications   No outpatient medications have been marked as taking for the 09/16/22 encounter (Appointment) with Elgie Collard, PA-C.     Review of Systems   ***   All other systems reviewed and are otherwise negative except as noted above.  Physical Exam    VS:  There were no vitals taken for this visit. , BMI There is no height or weight on file to calculate BMI.  Wt Readings from Last 3 Encounters:  09/14/22 198 lb 6.6 oz (90 kg)  09/02/22 199 lb (90.3 kg)  08/31/22 196 lb 3.4 oz (89 kg)     GEN: Well nourished, well developed, in no acute distress. HEENT: normal. Neck: Supple, no JVD, carotid bruits, or masses. Cardiac: ***RRR, no murmurs, rubs, or gallops. No clubbing, cyanosis, edema.  ***Radials/PT 2+ and equal bilaterally.  Respiratory:  ***Respirations regular and unlabored, clear to auscultation bilaterally. GI: Soft, nontender, nondistended. MS: No deformity or atrophy. Skin: Warm and dry, no rash. Neuro:  Strength and sensation are intact. Psych: Normal affect.  Assessment & Plan    Suspected PEA arrest Multiple bilateral rib fractures Acute metabolic encephalopathy Elevated troponins Acute asthmatic exacerbation PAF ESRD on HD Acute on chronic HFpEF       {The patient has an active order for outpatient cardiac rehabilitation.   Please indicate if the patient is ready to start. Do NOT delete this.  It will auto delete.  Refresh note, then sign.              Click here to document readiness and see contraindications.  :1}  Cardiac Rehabilitation Eligibility Assessment     Disposition: Follow up {follow up:15908} with [name], MD or APP.  Signed, Elgie Collard, PA-C 09/15/2022, 10:09 AM [place]

## 2022-09-15 NOTE — Progress Notes (Signed)
Late Entry Note:  Pt was d/c to home yesterday. Contacted clinic this morning to make clinic aware of pt's d/c and that pt will resume care tomorrow.   Melven Sartorius Renal Navigator [phone]

## 2022-09-15 NOTE — Progress Notes (Signed)
  Care Coordination   Note   09/15/2022 Name: Albert Eaton MRN: [id number] DOB: 07-Jun-1966  Albert Eaton is a 56 y.o. year old male who sees Cable, Alyson Locket, NP for primary care. I reached out to Hobson Lax Wittmeyer by phone today to offer care coordination services.  Albert Eaton was given information about Care Coordination services today including:   The Care Coordination services include support from the care team which includes your Nurse Coordinator, Clinical Social Worker, or Pharmacist.  The Care Coordination team is here to help remove barriers to the health concerns and goals most important to you. Care Coordination services are voluntary, and the patient may decline or stop services at any time by request to their care team member.   Care Coordination Consent Status: Patient agreed to services and verbal consent obtained.   Follow up plan:  Telephone appointment with care coordination team member scheduled for:  09/24/2022  Encounter Outcome:  Pt. Scheduled from referral   Julian Hy, [place] Direct Dial: [phone]

## 2022-09-16 ENCOUNTER — Ambulatory Visit: Payer: Medicare Other | Attending: Nurse Practitioner | Admitting: Physician Assistant

## 2022-09-16 DIAGNOSIS — S2243XA Multiple fractures of ribs, bilateral, initial encounter for closed fracture: Secondary | ICD-10-CM | POA: Insufficient documentation

## 2022-09-16 DIAGNOSIS — D509 Iron deficiency anemia, unspecified: Secondary | ICD-10-CM | POA: Diagnosis not present

## 2022-09-16 DIAGNOSIS — Z992 Dependence on renal dialysis: Secondary | ICD-10-CM | POA: Diagnosis not present

## 2022-09-16 DIAGNOSIS — N186 End stage renal disease: Secondary | ICD-10-CM | POA: Diagnosis not present

## 2022-09-16 DIAGNOSIS — N2581 Secondary hyperparathyroidism of renal origin: Secondary | ICD-10-CM | POA: Diagnosis not present

## 2022-09-16 DIAGNOSIS — E876 Hypokalemia: Secondary | ICD-10-CM | POA: Diagnosis not present

## 2022-09-16 DIAGNOSIS — R52 Pain, unspecified: Secondary | ICD-10-CM | POA: Diagnosis not present

## 2022-09-16 DIAGNOSIS — E079 Disorder of thyroid, unspecified: Secondary | ICD-10-CM | POA: Diagnosis not present

## 2022-09-16 DIAGNOSIS — D631 Anemia in chronic kidney disease: Secondary | ICD-10-CM | POA: Diagnosis not present

## 2022-09-17 ENCOUNTER — Telehealth: Payer: Self-pay | Admitting: Home Health

## 2022-09-17 ENCOUNTER — Ambulatory Visit: Payer: Medicare Other | Admitting: Nurse Practitioner

## 2022-09-17 ENCOUNTER — Telehealth: Payer: Self-pay | Admitting: *Deleted

## 2022-09-17 NOTE — Patient Outreach (Signed)
  Care Coordination [facility] Note Transition Care Management Unsuccessful Follow-up Telephone Call  Date of discharge and from where:  [facility] [phone]  Attempts:  1st Attempt  Reason for unsuccessful TCM follow-up call:  Left voice message  [facility] Care Management [phone]

## 2022-09-17 NOTE — Telephone Encounter (Signed)
IRHYTHM paged after hour line 530pm, reporting first documentation of A fib, ventricular rate 96-147 bpm, over 90 seconds, 2:19pm Est 09/17/22, patient was reached out, reporting he was walking and had chest pain during that time, no further follow up reported. He remains in A fib 124-176 as of 2:55pm since latest transmission.   Chart reviewed, patient has hx of PAF on amiodarone, metoprolol, and eliquis. LHC 09/11/22 stable CAD, no critical disease. He had atypical chest pain during recent hospitalization.   Called patient at 545pm He states he was walking to the mail box around 230pm, felt little chest discomfort and SOB, that was unusual for him, but overall feel fine now. He is not able to monitor his HR now but states he has an apple watch that he can charge and use. Advised the patient go to ER if he is having worsening chest discomfort, SOB, dizziness, syncope. If he overall remains stable, office appointment arranged on 09/23/22 at 8:15am for in-person evaluation and potential adjustment of his meds, advised him to continue wear his Zio monitor and monitor his HR with apple watch as well as symptoms, bring to office visit for review. He understood the plan and is agreeable.

## 2022-09-19 DIAGNOSIS — D509 Iron deficiency anemia, unspecified: Secondary | ICD-10-CM | POA: Diagnosis not present

## 2022-09-19 DIAGNOSIS — R52 Pain, unspecified: Secondary | ICD-10-CM | POA: Diagnosis not present

## 2022-09-19 DIAGNOSIS — Z992 Dependence on renal dialysis: Secondary | ICD-10-CM | POA: Diagnosis not present

## 2022-09-19 DIAGNOSIS — E079 Disorder of thyroid, unspecified: Secondary | ICD-10-CM | POA: Diagnosis not present

## 2022-09-19 DIAGNOSIS — E876 Hypokalemia: Secondary | ICD-10-CM | POA: Diagnosis not present

## 2022-09-19 DIAGNOSIS — D631 Anemia in chronic kidney disease: Secondary | ICD-10-CM | POA: Diagnosis not present

## 2022-09-19 DIAGNOSIS — N186 End stage renal disease: Secondary | ICD-10-CM | POA: Diagnosis not present

## 2022-09-19 DIAGNOSIS — N2581 Secondary hyperparathyroidism of renal origin: Secondary | ICD-10-CM | POA: Diagnosis not present

## 2022-09-22 DIAGNOSIS — E876 Hypokalemia: Secondary | ICD-10-CM | POA: Diagnosis not present

## 2022-09-22 DIAGNOSIS — N186 End stage renal disease: Secondary | ICD-10-CM | POA: Diagnosis not present

## 2022-09-22 DIAGNOSIS — Z992 Dependence on renal dialysis: Secondary | ICD-10-CM | POA: Diagnosis not present

## 2022-09-22 DIAGNOSIS — E079 Disorder of thyroid, unspecified: Secondary | ICD-10-CM | POA: Diagnosis not present

## 2022-09-22 DIAGNOSIS — R52 Pain, unspecified: Secondary | ICD-10-CM | POA: Diagnosis not present

## 2022-09-22 DIAGNOSIS — D509 Iron deficiency anemia, unspecified: Secondary | ICD-10-CM | POA: Diagnosis not present

## 2022-09-22 DIAGNOSIS — N2581 Secondary hyperparathyroidism of renal origin: Secondary | ICD-10-CM | POA: Diagnosis not present

## 2022-09-22 DIAGNOSIS — D631 Anemia in chronic kidney disease: Secondary | ICD-10-CM | POA: Diagnosis not present

## 2022-09-22 NOTE — Progress Notes (Unsigned)
Cardiology Office Note:    Date:  09/23/2022   ID:  Albert Eaton, DOB [birth year], MRN [id number]  PCP:  [name], NP  [place] Prov[facility] Cardiologist:  [name], MD Electrophysiologist:  Vickie Epley, MD     Referring MD: [name], NP   Chief Complaint:  Hospitalization Follow-up and Atrial Fibrillation     History of Present Illness:   Albert Eaton is a 56 y.o. male with past medical history significant for ESRD on HD M, W, F, chronic HFpEF, Severe MR status post mitral valve repair 10/23, PAF on amiodarone and Eliquis, OSA not tolerating CPAP, hyper thyroidism, hypertension, hyperlipidemia   The patient presented to the ED 11/13 after cardiac arrest during dialysis.  ROSC achieved.  The patient did not recall any of the events during the CODE BLUE at the HD center.  Reported he had a normal day and felt normal earlier that morning before going to HD.  Halfway into HD after removing about 2 L of fluid patient became unresponsive with GCS of 3, HD staff could not feel radial pulses/carotid pulses and CODE BLUE was called.  Monitor showed however the patient had SVT versus A-fib.  CPR was started right away and 2 rounds of epinephrine were given and 1 dose of calcium gluconate with patient recovering pulses and consciousness.  During episode patient was also found to have agonal breathing 4-5 times per minute.  No seizure-like activity.  CT angio showed multiple fractured ribs and small bilateral pleural effusions otherwise no significant abnormalities.  EKG showed normal sinus rhythm with borderline prolonged QTc.  Troponin trending 50 > 600.  Lactic acid 5.0> 2.4.  Patient evaluated by PCCM, recommended admission due to progressive care under hospital service.  LHC showed no change in coronary anatomy.  Recommended consideration to EP and Dr. Marlowe Kays placed a Zio patch.  Suspicion that it was a PEA arrest related to electrolyte shifts during dialysis.     He missed a f/u appt 09/16/22. 09/17/22 IRHYTHM paged after hour line 530pm, reporting first documentation of A fib, ventricular rate 96-147 bpm, over 90 seconds, 2:19pm Est 09/17/22, patient was reached out, reporting he was walking and had chest pain during that time, no further follow up reported. He remains in A fib 124-176 as of 2:55pm since latest transmission. Patient was called and was walking to the mailbox about 230pm and had a little chest pain and SOB which was not unusual for him.   Patient comes in for f/u. Uncomfortable from broken ribs. His mailbox is a long way to walk to and he has to go downstairs to a mailbox.  He denies chest tightness. He's been sore since his valve replacement and now has significant pain with broken ribs. Labs checked daily at dialysis.      Past Medical History:  Diagnosis Date   Anemia    Aortic atherosclerosis (HCC)    Arthritis    Asthma    Cancer ([place])    renal cyst   Chronic diastolic CHF (congestive heart failure) ([place]) 01/06/2017   Echo 7/16 [facility] in [place], [name]) Mild AI, mild LAE, mild concentric LVH, EF 55, normal wall motion, mild to moderate MR, mild PI, RVSP 55 // Echo 10/09/16 ([facility]):  Moderate LVH, grade 2 diastolic dysfunction, mild MR, moderate LAE    Esophagitis    ESRD (end stage renal disease) on dialysis [facility])    Dialysis Mon Wed Fri   GERD (gastroesophageal  reflux disease)    Gout    no current problems   Hepatitis    Hep B   HIV (human immunodeficiency virus infection) ([place])    HLD (hyperlipidemia)    Hypertension    Hypothyroidism    Mitral regurgitation    Neuropathy    Sleep apnea    uses c-pap   Wears glasses    Wears partial dentures    Current Medications: Current Meds  Medication Sig   Acetaminophen Extra Strength 500 MG TABS Take 2 tablets (1,000 mg total) by mouth every 8 (eight) hours.   amiodarone (PACERONE) 200 MG tablet Take 1 tablet (200 mg total) by mouth daily.   amLODipine  (NORVASC) 10 MG tablet Take 1 tablet (10 mg total) by mouth daily.   apixaban (ELIQUIS) 5 MG TABS tablet Take 1 tablet (5 mg total) by mouth 2 (two) times daily.   azelastine (ASTELIN) 0.1 % nasal spray Place 2 sprays into both nostrils 2 (two) times daily as needed for allergies.   B Complex-C-Folic Acid (DIALYVITE PO) Take 1 tablet by mouth daily.   B Complex-C-Zn-Folic Acid (DIALYVITE 161-WRUE 15 PO) Take 1 tablet by mouth daily.   BIKTARVY 50-200-25 MG TABS tablet TAKE ONE TABLET BY MOUTH EVERY DAY   budesonide-formoterol (SYMBICORT) 160-4.5 MCG/ACT inhaler Inhale 2 puffs into the lungs daily.   calcitRIOL (ROCALTROL) 0.25 MCG capsule Take 7 capsules (1.75 mcg total) by mouth every Monday, Wednesday, and Friday with hemodialysis.   desloratadine (CLARINEX) 5 MG tablet Take 1 tablet (5 mg total) by mouth daily.   diclofenac Sodium (VOLTAREN) 1 % GEL Apply 2 g topically 4 (four) times daily. Apply to chest pain when lidocaine patch not in place   Doxercalciferol (HECTOROL IV) as directed. Dialysis   famotidine (PEPCID) 20 MG tablet Take 1 tablet (20 mg total) by mouth 2 (two) times daily. (Patient taking differently: Take 20 mg by mouth 2 (two) times daily as needed for indigestion or heartburn.)   ferric citrate (AURYXIA) 1 GM 210 MG(Fe) tablet Take 420 mg by mouth See admin instructions. Take 420 mg by mouth three times a day with meals and 210 mg with each snack   fluticasone (FLOVENT HFA) 110 MCG/ACT inhaler Inhale 2 puffs into the lungs 2 (two) times daily as needed ("for flares").   gabapentin (NEURONTIN) 300 MG capsule Take 1 capsule (300 mg total) by mouth daily as needed (pain/neuropathy).   ipratropium (ATROVENT HFA) 17 MCG/ACT inhaler Inhale into the lungs as needed for wheezing.   levalbuterol (XOPENEX HFA) 45 MCG/ACT inhaler Inhale 2 puffs into the lungs every 4 (four) hours as needed for wheezing.   levothyroxine (SYNTHROID) 25 MCG tablet Take 25 mcg by mouth daily before breakfast.    lubiprostone (AMITIZA) 24 MCG capsule Take 1 capsule (24 mcg total) by mouth 2 (two) times daily with a meal. NEEDS OFFICE VISIT FOR FURTHER REFILLS   Methoxy PEG-Epoetin Beta (MIRCERA IJ) Inject 1 each into the skin every 30 (thirty) days. Administered at dialysis center once a month   metoprolol tartrate (LOPRESSOR) 25 MG tablet Take 1 tablet (25 mg total) by mouth 2 (two) times daily.   Olopatadine HCl (PATADAY) 0.7 % SOLN Place 1 drop into both eyes 2 (two) times daily as needed (allergies).   oxyCODONE (OXY IR/ROXICODONE) 5 MG immediate release tablet Take 1 tablet (5 mg total) by mouth every 6 (six) hours as needed for severe pain or moderate pain.   pantoprazole (PROTONIX) 40 MG tablet  Take 1 tablet (40 mg total) by mouth daily. **PLEASE CONTACT THE OFFICE TO SCHEDULE FOLLOW UP   rOPINIRole (REQUIP) 1 MG tablet TAKE ONE TABLET BY MOUTH AT BEDTIME   rosuvastatin (CRESTOR) 10 MG tablet TAKE ONE TABLET BY MOUTH DAILY   SENSIPAR 60 MG tablet Take 60 mg by mouth See admin instructions. Take 60 mg by mouth with supper after dialysis treatments on Mon/Wed/Fri   Spacer/Aero-Holding Chambers DEVI 1 Device by Does not apply route 2 (two) times a day.   Vitamin D, Ergocalciferol, (DRISDOL) 1.25 MG (50000 UNIT) CAPS capsule Take 50,000 Units by mouth every Tuesday.    Allergies:   Ace inhibitors   Social History   Tobacco Use   Smoking status: Former    Packs/day: 1.00    Years: 18.00    Total pack years: 18.00    Types: Cigarettes    Quit date: 2000    Years since quitting: 23.9   Smokeless tobacco: Never  Vaping Use   Vaping Use: Never used  Substance Use Topics   Alcohol use: Not Currently   Drug use: No    Family Hx: The patient's family history includes Alcohol abuse in his father; Heart attack (age of onset: 5) in his maternal grandmother; Heart failure in his mother; Hypertension in his brother, father, mother, sister, and sister; Multiple sclerosis in his sister; Scoliosis in  an other family member. There is no history of Allergic rhinitis, Angioedema, Asthma, Eczema, Immunodeficiency, Urticaria, Colon cancer, Pancreatic cancer, or Esophageal cancer.  ROS     Physical Exam:    VS:  BP 120/72   Pulse 87   Ht 5' 10"  (1.778 m)   Wt 193 lb (87.5 kg)   SpO2 96%   BMI 27.69 kg/m     Wt Readings from Last 3 Encounters:  09/23/22 193 lb (87.5 kg)  09/14/22 198 lb 6.6 oz (90 kg)  09/02/22 199 lb (90.3 kg)    Physical Exam  GEN: Well nourished, well developed, in no acute distress  Neck: no JVD, carotid bruits, or masses Cardiac:RRR; no murmurs, rubs, or gallops  Respiratory:  clear to auscultation bilaterally, normal work of breathing GI: soft, nontender, nondistended, + BS Ext: without cyanosis, clubbing, or edema, Good distal pulses bilaterally Neuro:  Alert and Oriented x 3,  Psych: euthymic mood, full affect        EKGs/Labs/Other Test Reviewed:    EKG:  EKG is  not ordered today.     Recent Labs: 02/20/2022: NT-Pro BNP 22,536 09/08/2022: B Natriuretic Peptide 2,787.4; TSH 2.868 09/13/2022: ALT 17; Magnesium 1.7 09/14/2022: BUN 66; Creatinine, Ser 11.49; Hemoglobin 8.0; Platelets 367; Potassium 4.4; Sodium 130   Recent Lipid Panel Recent Labs    04/17/22 0345 08/22/22 0901  CHOL 113  --   TRIG 131  --   HDL 35*  --   LDLCALC 57  --   LDLDIRECT  --  23     Prior CV Studies:    Cardiac catheterization 09/11/2022     Left Anterior Descending  Vessel is large. The LAD wraps around the LV apex. There is moderate diffuse 40 to 50% proximal LAD stenosis. The mid and distal LAD have no obstructive disease.  Prox LAD lesion is 50% stenosed.  Mid LAD lesion is 50% stenosed.    Left Circumflex  Vessel is large. The vessel exhibits minimal luminal irregularities. The circumflex is dominant. There is mild nonobstructive plaquing throughout. There are no high-grade stenoses throughout the circumflex,  obtuse marginal, or posterolateral  branches.    Second Obtuse Marginal Branch  2nd Mrg lesion is 30% stenosed.    Right Coronary Artery  Vessel is moderate in size. Nondominant vessel. Mild nonobstructive proximal vessel disease of about 40%.  Prox RCA lesion is 40% stenosed.    Intervention    No interventions have been documented.    Left Heart   Left Ventricle LV end diastolic pressure is mildly elevated.  Aortic Valve There is no aortic valve stenosis.    Coronary Diagrams   Diagnostic Dominance: Left  Intervention     Risk Assessment/Calculations/Metrics:    CHA2DS2-VASc Score = 3   This indicates a 3.2% annual risk of stroke. The patient's score is based upon: CHF History: 1 HTN History: 1 Diabetes History: 0 Stroke History: 0 Vascular Disease History: 1 Age Score: 0 Gender Score: 0             ASSESSMENT & PLAN:   No problem-specific Assessment & Plan notes found for this encounter.   Suspected PEA arrest 09/08/22 cath 09/11/22-nonobstructive disease. LVEF 45-50% on echo 09/09/22 with subsequent Multiple bilateral rib fractures, very sore and uncomfortable from this  PAF on amio and eliquis with recurrence rate 124-176  found on IRHYTHM  09/17/22-patient was walking a long way to his mailbox and was in significant pain from broken ribs. He remains on amio and metoprolol and hasn't missed any doses. He can take extra metoprolol if needed for fast rates.  MVR with mitral valve annuloplasy minimally invastive 09/02/22  ESRD on HD   chronic combined CHF-LVEF 45% on limited echo  OSA not tolerating CPAP  HTN BP controlled  HLD on crestor              Dispo:  No follow-ups on file.   Medication Adjustments/Labs and Tests Ordered: Current medicines are reviewed at length with the patient today.  Concerns regarding medicines are outlined above.  Tests Ordered: No orders of the defined types were placed in this encounter.  Medication Changes: No orders of the defined types were  placed in this encounter.  Signed, Ermalinda Barrios, PA-C  09/23/2022 9:05 AM    [address], [place], [place]  [address] Phone: [phone]; Fax: [phone]

## 2022-09-23 ENCOUNTER — Encounter: Payer: Self-pay | Admitting: Physician Assistant

## 2022-09-23 ENCOUNTER — Ambulatory Visit: Payer: Medicare Other | Attending: Physician Assistant | Admitting: Physician Assistant

## 2022-09-23 ENCOUNTER — Telehealth: Payer: Self-pay | Admitting: *Deleted

## 2022-09-23 VITALS — BP 120/72 | HR 87 | Ht 70.0 in | Wt 193.0 lb

## 2022-09-23 DIAGNOSIS — I10 Essential (primary) hypertension: Secondary | ICD-10-CM

## 2022-09-23 DIAGNOSIS — E785 Hyperlipidemia, unspecified: Secondary | ICD-10-CM

## 2022-09-23 DIAGNOSIS — Z9889 Other specified postprocedural states: Secondary | ICD-10-CM

## 2022-09-23 DIAGNOSIS — R0789 Other chest pain: Secondary | ICD-10-CM | POA: Diagnosis not present

## 2022-09-23 DIAGNOSIS — I469 Cardiac arrest, cause unspecified: Secondary | ICD-10-CM | POA: Diagnosis not present

## 2022-09-23 DIAGNOSIS — I480 Paroxysmal atrial fibrillation: Secondary | ICD-10-CM

## 2022-09-23 DIAGNOSIS — N186 End stage renal disease: Secondary | ICD-10-CM | POA: Diagnosis not present

## 2022-09-23 DIAGNOSIS — Z992 Dependence on renal dialysis: Secondary | ICD-10-CM

## 2022-09-23 NOTE — Patient Outreach (Signed)
  Care Coordination [facility] Note Transition Care Management Follow-up Telephone Call Date of discharge and from where: [facility] [phone] How have you been since you were released from the hospital? I am doing ok Any questions or concerns? No  Items Reviewed: Did the pt receive and understand the discharge instructions provided? Yes  Medications obtained and verified? Yes  Other? No  Any new allergies since your discharge? No  Dietary orders reviewed? Yes Low sodium Heart Healthy diet Do you have support at home? Yes   Home Care and Equipment/Supplies: Were home health services ordered? yes If so, what is the name of the agency? [facility]  Has the agency set up a time to come to the patient's home? yes Were any new equipment or medical supplies ordered?  Yes:   What is the name of the medical supply agency? [facility] Were you able to get the supplies/equipment? yes Do you have any questions related to the use of the equipment or supplies? No  Functional Questionnaire: (I = Independent and D = Dependent) ADLs: I  Bathing/Dressing- I  Meal Prep- I  Eating- I  Maintaining continence- I  Transferring/Ambulation- I  Managing Meds- I  Follow up appointments reviewed:  PCP Hospital f/u appt confirmed? No  [facility] f/u appt confirmed? Yes  Soyla Dryer 09/23/2022 8:15. Are transportation arrangements needed? No  If their condition worsens, is the pt aware to call PCP or go to the Emergency Dept.? Yes Was the patient provided with contact information for the PCP's office or ED? Yes Was to pt encouraged to call back with questions or concerns? Yes  SDOH assessments and interventions completed:   Yes  Care Coordination Interventions Activated:  Yes   Care Coordination Interventions:  Rn activated the [facility] meal Plan. Patient is also scheduled a call from [place] Nurse    Encounter Outcome:  Pt. Visit Completed

## 2022-09-23 NOTE — Patient Instructions (Signed)
Medication Instructions:  Your physician recommends that you continue on your current medications as directed. Please refer to the Current Medication list given to you today.  *If you need a refill on your cardiac medications before your next appointment, please call your pharmacy*   Lab Work: NONE If you have labs (blood work) drawn today and your tests are completely normal, you will receive your results only by: [place] (if you have MyChart) OR A paper copy in the mail If you have any lab test that is abnormal or we need to change your treatment, we will call you to review the results.   Testing/Procedures: NONE   Follow-Up: At [facility], you and your health needs are our priority.  As part of our continuing mission to provide you with exceptional heart care, we have created designated Provider Care Teams.  These Care Teams include your primary Cardiologist (physician) and Advanced Practice Providers (APPs -  Physician Assistants and Nurse Practitioners) who all work together to provide you with the care you need, when you need it.  We recommend signing up for the patient portal called "MyChart".  Sign up information is provided on this After Visit Summary.  MyChart is used to connect with patients for Virtual Visits (Telemedicine).  Patients are able to view lab/test results, encounter notes, upcoming appointments, etc.  Non-urgent messages can be sent to your provider as well.   To learn more about what you can do with MyChart, go to [email or web address].    Your next appointment:   11/18/22 @11  am  The format for your next appointment:   In Person  Provider:   [name], MD       Important Information About [place]

## 2022-09-24 ENCOUNTER — Telehealth: Payer: Self-pay | Admitting: *Deleted

## 2022-09-24 ENCOUNTER — Other Ambulatory Visit: Payer: Self-pay | Admitting: Nurse Practitioner

## 2022-09-24 ENCOUNTER — Ambulatory Visit: Payer: Self-pay

## 2022-09-24 ENCOUNTER — Telehealth: Payer: Self-pay | Admitting: Nurse Practitioner

## 2022-09-24 DIAGNOSIS — Z992 Dependence on renal dialysis: Secondary | ICD-10-CM | POA: Diagnosis not present

## 2022-09-24 DIAGNOSIS — E079 Disorder of thyroid, unspecified: Secondary | ICD-10-CM | POA: Diagnosis not present

## 2022-09-24 DIAGNOSIS — D631 Anemia in chronic kidney disease: Secondary | ICD-10-CM | POA: Diagnosis not present

## 2022-09-24 DIAGNOSIS — N2581 Secondary hyperparathyroidism of renal origin: Secondary | ICD-10-CM | POA: Diagnosis not present

## 2022-09-24 DIAGNOSIS — S2243XD Multiple fractures of ribs, bilateral, subsequent encounter for fracture with routine healing: Secondary | ICD-10-CM

## 2022-09-24 DIAGNOSIS — R52 Pain, unspecified: Secondary | ICD-10-CM | POA: Diagnosis not present

## 2022-09-24 DIAGNOSIS — E876 Hypokalemia: Secondary | ICD-10-CM | POA: Diagnosis not present

## 2022-09-24 DIAGNOSIS — D509 Iron deficiency anemia, unspecified: Secondary | ICD-10-CM | POA: Diagnosis not present

## 2022-09-24 DIAGNOSIS — N186 End stage renal disease: Secondary | ICD-10-CM | POA: Diagnosis not present

## 2022-09-24 MED ORDER — OXYCODONE HCL 5 MG PO TABS: 5.0000 mg | ORAL_TABLET | Freq: Four times a day (QID) | ORAL | 0 refills | Status: AC | PRN

## 2022-09-24 NOTE — Telephone Encounter (Signed)
Patient advised.

## 2022-09-24 NOTE — Patient Outreach (Signed)
  Care Coordination   Follow Up Visit Note   09/24/2022 Name: Albert Eaton MRN: [id number] DOB: 28-Nov-1965  Albert Eaton is a 56 y.o. year old male who sees Cable, Alyson Locket, NP for primary care. I spoke with  Albert Eaton by phone today.  What matters to the patients health and wellness today?  Patient reports having a follow up visit with his cardiologist on yesterday. Denies any change in treatment plan.  He states he is to continue on his current regimen. He reports he followed up with cardiologist office to request a refill of his pain medication and is waiting to hear back.  Patient states he is still sore due to fractured ribs during chest compressions.   Patient states he received a call on yesterday from his [place] health care insurance but was asleep at time of call.  He states he will call today to follow up on [facility] meal plan program.  Patient states he has his medications and understands what is he is taking.   He reports having dialysis on M, W, F.  Patient states he follows up with his providers regularly.  He state she knows when to call them regarding ongoing or new symptoms. Patient states x 2 that he does not feel he needs care coordination follow up at this time because he is able to able to manage.    Goals Addressed               This Visit's Progress     Care coordination activities - no further follow up (pt-stated)        Care Coordination Interventions: Evaluation of current treatment plan related to recent hospitalization for cardiac arrest and multiple medical conditions  and patient's adherence to plan as established by provider Reviewed medications with patient and discussed importance of compliance.  Reviewed scheduled/upcoming provider appointments Discussed plans with patient for ongoing care management follow up  Patient advised to follow up with primary care provider for referring if care coordination services needed in the future.  Advised patient to  return call to [facility] care regarding meal plan access Reinforced calling 911 for severe symptoms.  Call provider for new or ongoing mild to moderate symptoms.           SDOH assessments and interventions completed:  No     Care Coordination Interventions:  Yes, provided   Follow up plan: No further intervention or follow up required at patients request.    Encounter Outcome:  Pt. Visit Completed   Quinn Plowman RN,BSN,CCM [place] [phone] direct line

## 2022-09-24 NOTE — Telephone Encounter (Signed)
Caller Name: burt  Call back phone #: [phone]  MEDICATION(S):  oxyCODONE (OXY IR/ROXICODONE) 5 MG immediate release tablet   Days of Med Remaining: 0  Has the patient contacted their pharmacy (YES/NO)? YES What did pharmacy advise?  Pharmacy suggest pt to contact pcp to refill meds   Preferred Pharmacy:  [facility], [place]   ~~~Please advise patient/caregiver to allow 2-3 business days to process RX refills.   Pt was originally prescribed meds from a doctor in the hospital, Dr. Vance Gather. Pt stated he didn't know how to get back in touch with Dr. Bonner Puna to refill meds, so pharmacy suggested he contact Cable.

## 2022-09-24 NOTE — Progress Notes (Signed)
  Care Coordination  Outreach Note  09/24/2022 Name: Nyxon Strupp Pallo MRN: [id number] DOB: [birth year]   Care Coordination Outreach Attempts: An unsuccessful telephone outreach was attempted today to offer the patient information about available care coordination services as a benefit of their health plan.   Referral received   Follow Up Plan:  Additional outreach attempts will be made to offer the patient care coordination information and services.   Encounter Outcome:  No Answer  Julian Hy, [place] Direct Dial: [phone]

## 2022-09-24 NOTE — Telephone Encounter (Signed)
5 days worth of pain medication sent to [facility]

## 2022-09-24 NOTE — Progress Notes (Signed)
  Care Coordination   Note   09/24/2022 Name: Albert Eaton MRN: [id number] DOB: [birth year]  Albert Eaton is a 56 y.o. year old male who sees Cable, Alyson Locket, NP for primary care. I reached out to Albert Eaton by phone today to offer care coordination services.  Albert Eaton was given information about Care Coordination services today including:   The Care Coordination services include support from the care team which includes your Nurse Coordinator, Clinical Social Worker, or Pharmacist.  The Care Coordination team is here to help remove barriers to the health concerns and goals most important to you. Care Coordination services are voluntary, and the patient may decline or stop services at any time by request to their care team member.   Care Coordination Consent Status: Patient agreed to services and verbal consent obtained.   Follow up plan:  Telephone appointment with care coordination team member scheduled for:  09/30/2022  Encounter Outcome:  Pt. Scheduled from referral   Albert Eaton, [place] Direct Dial: [phone]

## 2022-09-24 NOTE — Telephone Encounter (Signed)
Patient was in ER/Hospital x 2 since last visit. Was D/C on 09/14/22 from the hospital-he is still hurting bad-finished Oxycodone 5 mg yesterday, this dose and medication helps a little but does not resolve it. He has multiple rib fractures per report. He has been taking also Tylenol about 6 pills a day since coming home that is what he was recommended to do to alternate. Did not get Lidocaine patches, pharmacy did not fill this. Has been using Voltaren gel more at night time also. Hospital follow up made.

## 2022-09-25 DIAGNOSIS — I129 Hypertensive chronic kidney disease with stage 1 through stage 4 chronic kidney disease, or unspecified chronic kidney disease: Secondary | ICD-10-CM | POA: Diagnosis not present

## 2022-09-25 DIAGNOSIS — Z992 Dependence on renal dialysis: Secondary | ICD-10-CM | POA: Diagnosis not present

## 2022-09-25 DIAGNOSIS — N186 End stage renal disease: Secondary | ICD-10-CM | POA: Diagnosis not present

## 2022-09-26 ENCOUNTER — Ambulatory Visit (INDEPENDENT_AMBULATORY_CARE_PROVIDER_SITE_OTHER): Payer: Medicare Other | Admitting: Nurse Practitioner

## 2022-09-26 VITALS — BP 134/74 | HR 85 | Temp 99.2°F | Resp 14 | Ht 70.0 in | Wt 193.4 lb

## 2022-09-26 DIAGNOSIS — E876 Hypokalemia: Secondary | ICD-10-CM | POA: Diagnosis not present

## 2022-09-26 DIAGNOSIS — R0789 Other chest pain: Secondary | ICD-10-CM | POA: Diagnosis not present

## 2022-09-26 DIAGNOSIS — D631 Anemia in chronic kidney disease: Secondary | ICD-10-CM | POA: Diagnosis not present

## 2022-09-26 DIAGNOSIS — Z992 Dependence on renal dialysis: Secondary | ICD-10-CM

## 2022-09-26 DIAGNOSIS — N2581 Secondary hyperparathyroidism of renal origin: Secondary | ICD-10-CM | POA: Diagnosis not present

## 2022-09-26 DIAGNOSIS — Z9889 Other specified postprocedural states: Secondary | ICD-10-CM

## 2022-09-26 DIAGNOSIS — N186 End stage renal disease: Secondary | ICD-10-CM | POA: Diagnosis not present

## 2022-09-26 DIAGNOSIS — E079 Disorder of thyroid, unspecified: Secondary | ICD-10-CM | POA: Diagnosis not present

## 2022-09-26 DIAGNOSIS — D509 Iron deficiency anemia, unspecified: Secondary | ICD-10-CM | POA: Diagnosis not present

## 2022-09-26 NOTE — Assessment & Plan Note (Signed)
Currently Monday Wednesday Friday dialysis.  He did go today and was able to do a full treatment.  States that check labs this past Monday at dialysis

## 2022-09-26 NOTE — Progress Notes (Signed)
Established Patient Office Visit  Subjective   Patient ID: Albert Eaton, male    DOB: 07-04-1966  Age: 56 y.o. MRN: [id number]  Chief Complaint  Patient presents with   Hospitalization Follow-up    HPI  Hospital follow up: Patient was admitted on 08/21/2022 for a mitral vavle replacement. He was at dialysis and had a witnessed cardiac arrest on 11/08/2021. ROSC was obtained and he was taken to the hospital. THey preformed a Cath that shoed nonobstructive disease and EF o f45-50%   He has had outpatient follow up and was sen in the cardiology office on 09/23/2022  States that he feels like he is doing ok. States that he is still having chest wall pain . States that he went ot dialysis for approx 6 hours and was hurting pretty good after that.   States that he is using the IS and making sure to take deep breaths every so often.   Skin lesion: Located on left lower abdomen over his previous kidney transplant scar.  States that it swelled up with fluid has decreased in size slightly no purulent discharge per patient report.  Nontender to palpation    Review of Systems  Constitutional:  Negative for chills and fever.  Respiratory:  Positive for shortness of breath.   Cardiovascular:  Positive for chest pain.      Objective:     BP 134/74   Pulse 85   Temp 99.2 F (37.3 C) (Oral)   Resp 14   Ht 5' 10"  (1.778 m)   Wt 193 lb 6 oz (87.7 kg)   SpO2 96%   BMI 27.75 kg/m    Physical Exam Vitals and nursing note reviewed.  Constitutional:      Appearance: Normal appearance.  Cardiovascular:     Rate and Rhythm: Normal rate and regular rhythm.  Pulmonary:     Effort: Pulmonary effort is normal.     Breath sounds: Normal breath sounds.  Chest:     Chest wall: Tenderness present.  Genitourinary:    Penis: Normal.   Neurological:     Mental Status: He is alert.      No results found for any visits on 09/26/22.    The ASCVD Risk score (Arnett DK, et al., 2019)  failed to calculate for the following reasons:   The patient has a prior MI or stroke diagnosis    Assessment & Plan:   Problem List Items Addressed This Visit       Genitourinary   ESRD (end stage renal disease) on dialysis [facility])    Currently Monday Wednesday Friday dialysis.  He did go today and was able to do a full treatment.  States that check labs this past Monday at dialysis        Other   Chest wall pain - Primary    Patient had a witnessed cardiac arrest at dialysis.  CPR was initiated and ROSC was obtained.  Patient was sent to the hospital and Then discharged.  They did do imaging of his chest which showed several broken ribs bilaterally.  Patient still dealing with some of that pain and discomfort.  States that it has improved but still ever present.  Patient instructed to continue the I-S and make sure he is taking deep breaths and getting up moving around.  Recently refilled patient's oxycodone 5 mg tabs as needed for pain told patient if he needs an additional refill reach out to the office.  S/P MVR (mitral valve repair)    Status post mitral valve replacement.  Patient does have an appoint with cardiothoracic surgery coming up in a couple weeks.  Doing well postop       Return in about 3 months (around 12/26/2022) for CPE and Labs.    Romilda Garret, NP

## 2022-09-26 NOTE — Assessment & Plan Note (Signed)
Patient had a witnessed cardiac arrest at dialysis.  CPR was initiated and ROSC was obtained.  Patient was sent to the hospital and Then discharged.  They did do imaging of his chest which showed several broken ribs bilaterally.  Patient still dealing with some of that pain and discomfort.  States that it has improved but still ever present.  Patient instructed to continue the I-S and make sure he is taking deep breaths and getting up moving around.  Recently refilled patient's oxycodone 5 mg tabs as needed for pain told patient if he needs an additional refill reach out to the office.

## 2022-09-26 NOTE — Assessment & Plan Note (Signed)
Status post mitral valve replacement.  Patient does have an appoint with cardiothoracic surgery coming up in a couple weeks.  Doing well postop

## 2022-09-26 NOTE — Patient Instructions (Signed)
Nice to see you today I want to see you in 3 months for you physical, sooner if you need me Continue working with the incentive spirometer and doing the deep breathing exercises.

## 2022-09-29 DIAGNOSIS — E079 Disorder of thyroid, unspecified: Secondary | ICD-10-CM | POA: Diagnosis not present

## 2022-09-29 DIAGNOSIS — N186 End stage renal disease: Secondary | ICD-10-CM | POA: Diagnosis not present

## 2022-09-29 DIAGNOSIS — D509 Iron deficiency anemia, unspecified: Secondary | ICD-10-CM | POA: Diagnosis not present

## 2022-09-29 DIAGNOSIS — Z992 Dependence on renal dialysis: Secondary | ICD-10-CM | POA: Diagnosis not present

## 2022-09-29 DIAGNOSIS — D631 Anemia in chronic kidney disease: Secondary | ICD-10-CM | POA: Diagnosis not present

## 2022-09-29 DIAGNOSIS — E876 Hypokalemia: Secondary | ICD-10-CM | POA: Diagnosis not present

## 2022-09-29 DIAGNOSIS — N2581 Secondary hyperparathyroidism of renal origin: Secondary | ICD-10-CM | POA: Diagnosis not present

## 2022-09-30 ENCOUNTER — Ambulatory Visit: Payer: Self-pay

## 2022-09-30 NOTE — Patient Instructions (Signed)
Visit Information  Thank you for taking time to visit with me today. Please don't hesitate to contact me if I can be of assistance to you.   Following are the goals we discussed today:   Goals Addressed             This Visit's Progress    COMPLETED: Care Coordination Activities       Care Coordination Interventions: Discussed the patient has not received post discharge meal benefits from health plan Assessed for patient knowledge of U card benefit to assist with healthy foods and utility costs each month - patient aware Assisted the patient in contacting his health plan to order post discharge meals since recent inpatient stay Advised by the health plan a member of the [facility] team will contact the patient within 1-2 business days to complete the order Discussed plan for the patient to contact SW directly as needed         If you are experiencing a Mental Health or [place] or need someone to talk to, please call 1-800-273-TALK (toll free, 24 hour hotline)  Patient verbalizes understanding of instructions and care plan provided today and agrees to view in [place]. Active MyChart status and patient understanding of how to access instructions and care plan via MyChart confirmed with patient.     No further follow up required: Please contact me as needed.  Daneen Schick, BSW, CDP Social Worker, Certified Dementia Practitioner [facility]  Care Coordination [phone]

## 2022-09-30 NOTE — Patient Outreach (Signed)
  Care Coordination   Follow Up Visit Note   09/30/2022 Name: Albert Eaton MRN: [id number] DOB: 1966/05/01  Albert Eaton is a 56 y.o. year old male who sees Cable, Alyson Locket, NP for primary care. I spoke with  Albert Eaton by phone today.  What matters to the patients health and wellness today?  Assistance with ordering post acute meals    Goals Addressed             This Visit's Progress    COMPLETED: Care Coordination Activities       Care Coordination Interventions: Discussed the patient has not received post discharge meal benefits from health plan Assessed for patient knowledge of U card benefit to assist with healthy foods and utility costs each month - patient aware Assisted the patient in contacting his health plan to order post discharge meals since recent inpatient stay Advised by the health plan a member of the [facility] team will contact the patient within 1-2 business days to complete the order Discussed plan for the patient to contact SW directly as needed         SDOH assessments and interventions completed:  No     Care Coordination Interventions:  Yes, provided   Follow up plan: No further intervention required.   Encounter Outcome:  Pt. Visit Completed   Daneen Schick, BSW, CDP Social Worker, Certified Dementia Practitioner [place] [facility]  Care Coordination [phone]

## 2022-10-01 DIAGNOSIS — N2581 Secondary hyperparathyroidism of renal origin: Secondary | ICD-10-CM | POA: Diagnosis not present

## 2022-10-01 DIAGNOSIS — Z992 Dependence on renal dialysis: Secondary | ICD-10-CM | POA: Diagnosis not present

## 2022-10-01 DIAGNOSIS — D509 Iron deficiency anemia, unspecified: Secondary | ICD-10-CM | POA: Diagnosis not present

## 2022-10-01 DIAGNOSIS — N186 End stage renal disease: Secondary | ICD-10-CM | POA: Diagnosis not present

## 2022-10-01 DIAGNOSIS — E079 Disorder of thyroid, unspecified: Secondary | ICD-10-CM | POA: Diagnosis not present

## 2022-10-01 DIAGNOSIS — D631 Anemia in chronic kidney disease: Secondary | ICD-10-CM | POA: Diagnosis not present

## 2022-10-01 DIAGNOSIS — E876 Hypokalemia: Secondary | ICD-10-CM | POA: Diagnosis not present

## 2022-10-02 ENCOUNTER — Other Ambulatory Visit: Payer: Self-pay | Admitting: Thoracic Surgery (Cardiothoracic Vascular Surgery)

## 2022-10-02 DIAGNOSIS — Z9889 Other specified postprocedural states: Secondary | ICD-10-CM

## 2022-10-03 DIAGNOSIS — D509 Iron deficiency anemia, unspecified: Secondary | ICD-10-CM | POA: Diagnosis not present

## 2022-10-03 DIAGNOSIS — N186 End stage renal disease: Secondary | ICD-10-CM | POA: Diagnosis not present

## 2022-10-03 DIAGNOSIS — Z992 Dependence on renal dialysis: Secondary | ICD-10-CM | POA: Diagnosis not present

## 2022-10-03 DIAGNOSIS — N2581 Secondary hyperparathyroidism of renal origin: Secondary | ICD-10-CM | POA: Diagnosis not present

## 2022-10-03 DIAGNOSIS — E079 Disorder of thyroid, unspecified: Secondary | ICD-10-CM | POA: Diagnosis not present

## 2022-10-03 DIAGNOSIS — D631 Anemia in chronic kidney disease: Secondary | ICD-10-CM | POA: Diagnosis not present

## 2022-10-03 DIAGNOSIS — E876 Hypokalemia: Secondary | ICD-10-CM | POA: Diagnosis not present

## 2022-10-04 ENCOUNTER — Other Ambulatory Visit: Payer: Self-pay | Admitting: Allergy & Immunology

## 2022-10-06 ENCOUNTER — Encounter: Payer: Self-pay | Admitting: Nurse Practitioner

## 2022-10-06 ENCOUNTER — Other Ambulatory Visit: Payer: Self-pay | Admitting: Cardiovascular Disease

## 2022-10-06 ENCOUNTER — Encounter: Payer: Self-pay | Admitting: Thoracic Surgery (Cardiothoracic Vascular Surgery)

## 2022-10-06 ENCOUNTER — Ambulatory Visit
Admission: RE | Admit: 2022-10-06 | Discharge: 2022-10-06 | Disposition: A | Discharge status: Home / Self Care | Payer: Medicare Other | Source: Ambulatory Visit | Attending: Thoracic Surgery (Cardiothoracic Vascular Surgery) | Admitting: Thoracic Surgery (Cardiothoracic Vascular Surgery)

## 2022-10-06 ENCOUNTER — Ambulatory Visit (INDEPENDENT_AMBULATORY_CARE_PROVIDER_SITE_OTHER): Payer: Self-pay | Admitting: Thoracic Surgery (Cardiothoracic Vascular Surgery)

## 2022-10-06 ENCOUNTER — Other Ambulatory Visit: Payer: Self-pay | Admitting: Pulmonary Disease

## 2022-10-06 VITALS — BP 150/87 | HR 88 | Resp 20 | Ht 70.0 in | Wt 195.0 lb

## 2022-10-06 DIAGNOSIS — R0781 Pleurodynia: Secondary | ICD-10-CM | POA: Diagnosis not present

## 2022-10-06 DIAGNOSIS — N2581 Secondary hyperparathyroidism of renal origin: Secondary | ICD-10-CM | POA: Diagnosis not present

## 2022-10-06 DIAGNOSIS — E876 Hypokalemia: Secondary | ICD-10-CM | POA: Diagnosis not present

## 2022-10-06 DIAGNOSIS — D509 Iron deficiency anemia, unspecified: Secondary | ICD-10-CM | POA: Diagnosis not present

## 2022-10-06 DIAGNOSIS — Z992 Dependence on renal dialysis: Secondary | ICD-10-CM | POA: Diagnosis not present

## 2022-10-06 DIAGNOSIS — E079 Disorder of thyroid, unspecified: Secondary | ICD-10-CM | POA: Diagnosis not present

## 2022-10-06 DIAGNOSIS — N186 End stage renal disease: Secondary | ICD-10-CM | POA: Diagnosis not present

## 2022-10-06 DIAGNOSIS — D631 Anemia in chronic kidney disease: Secondary | ICD-10-CM | POA: Diagnosis not present

## 2022-10-06 DIAGNOSIS — Z9889 Other specified postprocedural states: Secondary | ICD-10-CM

## 2022-10-06 MED ORDER — OXYCODONE HCL 5 MG PO TABS: 5.0000 mg | ORAL_TABLET | Freq: Four times a day (QID) | ORAL | 0 refills | Status: AC | PRN

## 2022-10-06 NOTE — Patient Instructions (Signed)
No lifting over 10lbs for 4 weeks Call if pain increases or continues after 4 weeks

## 2022-10-06 NOTE — Progress Notes (Signed)
[address]       [place] [address]             [facility] Medical Record #[id number] Date of Birth: Oct 12, 1966  [name], MD [name], NP  Chief Complaint:   SP mini thorocotomy mitral valve repair  History of Present Illness:     Pt is now sp mini thorocotomy mitral valve repair complicated with out of hospital arrest during dialysis. Found to have trivial mr on echo and cath without significant new CAD. Currently being managed for intermittent afib with amio and eliquis. Pt overall major complaint is from chest wall pain from bilateral rib fractures suffered during CPR at arrest. Other wise tolerating dialysis well     Past Medical History:  Diagnosis Date   Anemia    Aortic atherosclerosis (HCC)    Arthritis    Asthma    Cancer ([place])    renal cyst   Chronic diastolic CHF (congestive heart failure) ([place]) 01/06/2017   Echo 7/16 [facility] in [place], [name]) Mild AI, mild LAE, mild concentric LVH, EF 55, normal wall motion, mild to moderate MR, mild PI, RVSP 55 // Echo 10/09/16 ([facility]):  Moderate LVH, grade 2 diastolic dysfunction, mild MR, moderate LAE    Esophagitis    ESRD (end stage renal disease) on dialysis [facility])    Dialysis Mon Wed Fri   GERD (gastroesophageal reflux disease)    Gout    no current problems   Hepatitis    Hep B   HIV (human immunodeficiency virus infection) ([place])    HLD (hyperlipidemia)    Hypertension    Hypothyroidism    Mitral regurgitation    Neuropathy    Sleep apnea    uses c-pap   Wears glasses    Wears partial dentures     Past Surgical History:  Procedure Laterality Date   AV FISTULA PLACEMENT Left 07/02/2018   Procedure: Creation of Left arm BRACHIOBASILIC ARTERIOVENOUS  FISTULA;  Surgeon: Marty Heck, MD;  Location: [place];  Service: Vascular;  Laterality: Left;   [place] Left 08/27/2018   Procedure: Left arm BRACHIOBASILIC  VEIN TRANSPOSITION SECOND STAGE;  Surgeon: Marty Heck, MD;  Location: [place];  Service: Vascular;  Laterality: Left;   BUBBLE STUDY  09/03/2020   Procedure: BUBBLE STUDY;  Surgeon: Fay Records, MD;  Location: [facility];  Service: Cardiovascular;;   BUBBLE STUDY  06/03/2022   Procedure: BUBBLE STUDY;  Surgeon: Elouise Munroe, MD;  Location: [facility];  Service: Cardiology;;   CAPD INSERTION N/A 05/30/2021   Procedure: LAPAROSCOPIC INSERTION CONTINUOUS AMBULATORY PERITONEAL DIALYSIS  (CAPD) CATHETER;  Surgeon: [name], MD;  Location: [facility];  Service: Vascular;  Laterality: N/A;   CAPD REMOVAL N/A 02/13/2022   Procedure: PERITONEAL DIALYSIS CATHETER REMOVAL;  Surgeon: [name], MD;  [facility];  Service: Vascular;  Laterality: N/A;   CHOLECYSTECTOMY     COLONOSCOPY W/ BIOPSIES AND POLYPECTOMY     GIVENS CAPSULE STUDY N/A 03/01/2018   Procedure: GIVENS CAPSULE STUDY;  Surgeon: Jerene Bears, MD;  Location: [facility];  Service: Gastroenterology;  Laterality: N/A;   HERNIA REPAIR     As baby   LEFT HEART CATH AND CORONARY ANGIOGRAPHY N/A 09/11/2022   Procedure: LEFT HEART CATH AND CORONARY ANGIOGRAPHY;  Surgeon: Jettie Booze, MD;  Location: [facility]  INVASIVE CV LAB;  Service: Cardiovascular;  Laterality: N/A;   MITRAL VALVE REPAIR N/A 08/21/2022   Procedure: MINIMALLY INVASIVE MITRAL VALVE REPAIR USING RING ANNULOPLASTY SIMULUS 66m;  Surgeon: WCoralie Common MD;  [place]  Service: Open Heart Surgery;  Laterality: N/A;   MULTIPLE TOOTH EXTRACTIONS     NEPHRECTOMY Left 02/18/2021   NOSE SURGERY     RENAL BIOPSY     RIGHT/LEFT HEART CATH AND CORONARY ANGIOGRAPHY N/A 09/04/2021   Procedure: RIGHT/LEFT HEART CATH AND CORONARY ANGIOGRAPHY;  Surgeon: MBurnell Blanks MD;  [facility];  Service: Cardiovascular;  Laterality: N/A;   RIGHT/LEFT HEART CATH AND CORONARY ANGIOGRAPHY N/A 08/01/2022   Procedure: RIGHT/LEFT HEART CATH AND  CORONARY ANGIOGRAPHY;  Surgeon: CSherren Mocha MD;  [facility];  Service: Cardiovascular;  Laterality: N/A;   TEE WITHOUT CARDIOVERSION N/A 09/03/2020   Procedure: TRANSESOPHAGEAL ECHOCARDIOGRAM (TEE);  Surgeon: RFay Records MD;  [place] [facility]  Service: Cardiovascular;  Laterality: N/A;   TEE WITHOUT CARDIOVERSION N/A 06/03/2022   Procedure: TRANSESOPHAGEAL ECHOCARDIOGRAM (TEE);  Surgeon: AElouise Munroe MD;  [place] [facility]  Service: Cardiology;  Laterality: N/A;   TEE WITHOUT CARDIOVERSION N/A 08/21/2022   Procedure: TRANSESOPHAGEAL ECHOCARDIOGRAM (TEE);  Surgeon: WCoralie Common MD;  [place] [place]  Service: Open Heart Surgery;  Laterality: N/A;   UPPER GI ENDOSCOPY  04/05/2021    Social History   Tobacco Use  Smoking Status Former   Packs/day: 1.00   Years: 18.00   Total pack years: 18.00   Types: Cigarettes   Quit date: 2000   Years since quitting: 23.9  Smokeless Tobacco Never    Social History   Substance and Sexual Activity  Alcohol Use Not Currently    Social History   Socioeconomic History   Marital status: Single    Spouse name: Not on file   Number of children: Not on file   Years of education: Not on file   Highest education level: Not on file  Occupational History   Not on file  Tobacco Use   Smoking status: Former    Packs/day: 1.00    Years: 18.00    Total pack years: 18.00    Types: Cigarettes    Quit date: 2000    Years since quitting: 23.9   Smokeless tobacco: Never  Vaping Use   Vaping Use: Never used  Substance and Sexual Activity   Alcohol use: Not Currently   Drug use: No   Sexual activity: Not Currently    Partners: Male  Other Topics Concern   Not on file  Social History Narrative   Disability      [place]: none   Social Determinants of Health   Financial Resource Strain: Low Risk  (07/08/2022)   Overall Financial Resource Strain (CARDIA)    Difficulty of Paying Living Expenses: Not hard  at all  Food Insecurity: No Food Insecurity (09/23/2022)   Hunger Vital Sign    Worried About Running Out of Food in the Last Year: Never true    Ran Out of Food in the Last Year: Never true  Recent Concern: Food Insecurity - Food Insecurity Present (07/08/2022)   Hunger Vital Sign    Worried About Running Out of Food in the Last Year: Sometimes true    Ran Out of Food in the Last Year: Sometimes true  Transportation Needs: No Transportation Needs (09/23/2022)   PRAPARE - THydrologist(Medical): No  Lack of Transportation (Non-Medical): No  Physical Activity: Insufficiently Active (07/08/2022)   Exercise Vital Sign    Days of Exercise per Week: 2 days    Minutes of Exercise per Session: 60 min  Stress: No Stress Concern Present (07/08/2022)   [place]    Feeling of Stress : Not at all  Social Connections: Socially Isolated (07/08/2022)   Social Connection and Isolation Panel [NHANES]    Frequency of Communication with Friends and Family: More than three times a week    Frequency of Social Gatherings with Friends and Family: More than three times a week    Attends Religious Services: Never    Marine scientist or Organizations: No    Attends Archivist Meetings: Never    Marital Status: Never married  Intimate Partner Violence: Not At Risk (08/21/2022)   Humiliation, Afraid, Rape, and Kick questionnaire    Fear of Current or Ex-Partner: No    Emotionally Abused: No    Physically Abused: No    Sexually Abused: No    Allergies  Allergen Reactions   Ace Inhibitors Cough and Other (See Comments)    Current Outpatient Medications  Medication Sig Dispense Refill   Acetaminophen Extra Strength 500 MG TABS Take 2 tablets (1,000 mg total) by mouth every 8 (eight) hours.     amiodarone (PACERONE) 200 MG tablet Take 1 tablet (200 mg total) by mouth daily.     amLODipine  (NORVASC) 10 MG tablet Take 1 tablet (10 mg total) by mouth daily. 30 tablet 1   apixaban (ELIQUIS) 5 MG TABS tablet Take 1 tablet (5 mg total) by mouth 2 (two) times daily. 60 tablet 1   azelastine (ASTELIN) 0.1 % nasal spray Place 2 sprays into both nostrils 2 (two) times daily as needed for allergies. 30 mL 5   B Complex-C-Folic Acid (DIALYVITE PO) Take 1 tablet by mouth daily.     B Complex-C-Zn-Folic Acid (DIALYVITE 579-JKQA 15 PO) Take 1 tablet by mouth daily.     BIKTARVY 50-200-25 MG TABS tablet TAKE ONE TABLET BY MOUTH EVERY DAY 30 tablet 5   budesonide-formoterol (SYMBICORT) 160-4.5 MCG/ACT inhaler Inhale 2 puffs into the lungs daily. 10.2 g 1   calcitRIOL (ROCALTROL) 0.25 MCG capsule Take 7 capsules (1.75 mcg total) by mouth every Monday, Wednesday, and Friday with hemodialysis. 60 capsule 3   desloratadine (CLARINEX) 5 MG tablet Take 1 tablet (5 mg total) by mouth daily. 30 tablet 5   diclofenac Sodium (VOLTAREN) 1 % GEL Apply 2 g topically 4 (four) times daily. Apply to chest pain when lidocaine patch not in place 150 g 0   Doxercalciferol (HECTOROL IV) as directed. Dialysis     famotidine (PEPCID) 20 MG tablet Take 1 tablet (20 mg total) by mouth 2 (two) times daily. (Patient taking differently: Take 20 mg by mouth 2 (two) times daily as needed for indigestion or heartburn.) 180 tablet 3   ferric citrate (AURYXIA) 1 GM 210 MG(Fe) tablet Take 420 mg by mouth See admin instructions. Take 420 mg by mouth three times a day with meals and 210 mg with each snack     fluticasone (FLOVENT HFA) 110 MCG/ACT inhaler Inhale 2 puffs into the lungs 2 (two) times daily as needed ("for flares").     gabapentin (NEURONTIN) 300 MG capsule Take 1 capsule (300 mg total) by mouth daily as needed (pain/neuropathy).     ipratropium (ATROVENT HFA) 17 MCG/ACT  inhaler Inhale into the lungs as needed for wheezing.     levalbuterol (XOPENEX HFA) 45 MCG/ACT inhaler Inhale 2 puffs into the lungs every 4 (four) hours  as needed for wheezing. 15 g 1   levothyroxine (SYNTHROID) 25 MCG tablet Take 25 mcg by mouth daily before breakfast.     lubiprostone (AMITIZA) 24 MCG capsule Take 1 capsule (24 mcg total) by mouth 2 (two) times daily with a meal. NEEDS OFFICE VISIT FOR FURTHER REFILLS 60 capsule 1   Methoxy PEG-Epoetin Beta (MIRCERA IJ) Inject 1 each into the skin every 30 (thirty) days. Administered at dialysis center once a month     metoprolol tartrate (LOPRESSOR) 25 MG tablet Take 1 tablet (25 mg total) by mouth 2 (two) times daily. 60 tablet 3   Olopatadine HCl (PATADAY) 0.7 % SOLN Place 1 drop into both eyes 2 (two) times daily as needed (allergies).     pantoprazole (PROTONIX) 40 MG tablet Take 1 tablet (40 mg total) by mouth daily. **PLEASE CONTACT THE OFFICE TO SCHEDULE FOLLOW UP 30 tablet 0   rOPINIRole (REQUIP) 1 MG tablet TAKE ONE TABLET BY MOUTH AT BEDTIME 90 tablet 2   rosuvastatin (CRESTOR) 10 MG tablet TAKE ONE TABLET BY MOUTH DAILY 90 tablet 3   SENSIPAR 60 MG tablet Take 60 mg by mouth See admin instructions. Take 60 mg by mouth with supper after dialysis treatments on Mon/Wed/Fri     Spacer/Aero-Holding Chambers DEVI 1 Device by Does not apply route 2 (two) times a day. 1 each 1   Vitamin D, Ergocalciferol, (DRISDOL) 1.25 MG (50000 UNIT) CAPS capsule Take 50,000 Units by mouth every Tuesday.     No current facility-administered medications for this visit.     Family History  Problem Relation Age of Onset   Hypertension Mother    Heart failure Mother    Hypertension Father    Alcohol abuse Father    Hypertension Sister    Multiple sclerosis Sister    Hypertension Sister    Hypertension Brother    Heart attack Maternal Grandmother 53   Scoliosis Other    Allergic rhinitis Neg Hx    Angioedema Neg Hx    Asthma Neg Hx    Eczema Neg Hx    Immunodeficiency Neg Hx    Urticaria Neg Hx    Colon cancer Neg Hx    Pancreatic cancer Neg Hx    Esophageal cancer Neg Hx        Physical  Exam: Lungs: clear Chest: tender anterior sternum and laterally Card: rr without murmur. Incision well healed Ext: no edema    Diagnostic Studies & Laboratory data: I have personally reviewed the following studies and agree with the findings  ECHO 08/2022  IMPRESSIONS     1. Left ventricular ejection fraction, by estimation, is 45 to 50%. The  left ventricle has mildly decreased function. The left ventricle  demonstrates global hypokinesis. There is moderate concentric left  ventricular hypertrophy. Left ventricular  diastolic function could not be evaluated.   2. Right ventricular systolic function was not well visualized. The right  ventricular size is moderately enlarged.   3. The mitral valve has been repaired/replaced. Trivial mitral valve  regurgitation. There is a 30 mm Medtronic prosthetic annuloplasty ring  present in the mitral position. Procedure Date: 08/21/22.   4. The aortic valve is grossly normal. Aortic valve regurgitation is not  visualized.   Comparison(s): No significant change from prior study.   FINDINGS  Left Ventricle: Left ventricular ejection fraction, by estimation, is 45  to 50%. The left ventricle has mildly decreased function. The left  ventricle demonstrates global hypokinesis. Definity contrast agent was  given IV to delineate the left ventricular   endocardial borders. The left ventricular internal cavity size was normal  in size. There is moderate concentric left ventricular hypertrophy. Left  ventricular diastolic function could not be evaluated due to mitral valve  repair. Left ventricular  diastolic function could not be evaluated.   Right Ventricle: The right ventricular size is moderately enlarged. Right  vetricular wall thickness was not well visualized. Right ventricular  systolic function was not well visualized.   Left Atrium: Left atrial size was not well visualized.   Right Atrium: Right atrial size was not well visualized.    Pericardium: There is no evidence of pericardial effusion.   Mitral Valve: The mitral valve has been repaired/replaced. Trivial mitral  valve regurgitation. There is a 30 mm Medtronic prosthetic annuloplasty  ring present in the mitral position. Procedure Date: 08/21/22.   Tricuspid Valve: The tricuspid valve is grossly normal. Tricuspid valve  regurgitation is trivial. No evidence of tricuspid stenosis.   Aortic Valve: The aortic valve is grossly normal. Aortic valve  regurgitation is not visualized.   Pulmonic Valve: The pulmonic valve was not well visualized. Pulmonic valve  regurgitation is not visualized.   Aorta: Aortic root could not be assessed and the ascending aorta was not  well visualized.   IAS/Shunts: The interatrial septum was not well visualized.     LEFT VENTRICLE  PLAX 2D  LVIDd:         5.10 cm  LVIDs:         4.50 cm  LV PW:         1.60 cm  LV IVS:        1.20 cm     MITRAL VALVE  MV Area (PHT): 2.36 cm  MV Decel Time: 321 msec  MR Peak grad: 99.8 mmHg  MR Mean grad: 67.5 mmHg  MR Vmax:      499.50 cm/s  MR Vmean:     392.5 cm/s  MV E velocity: 170.00 cm/s    Recent Radiology Findings:   CXR 10/06/22  COMPARISON: 09/08/2022  FINDINGS: Stable cardiomediastinal contours status post mitral valve replacement. No focal airspace consolidation, pleural effusion, or pneumothorax. Known bilateral anterior rib fractures are not well demonstrated radiographically. There is a mildly displaced fracture of the sternal body.  IMPRESSION: 1. Mildly displaced fracture of the sternal body. 2. Known bilateral anterior rib fractures are not well demonstrated radiographically. 3. No acute cardiopulmonary findings.   Recent Lab Findings: Lab Results  Component Value Date   WBC 9.1 09/14/2022   HGB 8.0 (L) 09/14/2022   HCT 24.6 (L) 09/14/2022   PLT 367 09/14/2022   GLUCOSE 95 09/14/2022   CHOL 113 04/17/2022   TRIG 131 04/17/2022   HDL 35 (L)  04/17/2022   LDLDIRECT 23 08/22/2022   LDLCALC 57 04/17/2022   ALT 17 09/13/2022   AST 12 (L) 09/13/2022   NA 130 (L) 09/14/2022   K 4.4 09/14/2022   CL 88 (L) 09/14/2022   CREATININE 11.49 (H) 09/14/2022   BUN 66 (H) 09/14/2022   CO2 23 09/14/2022   TSH 2.868 09/08/2022   INR 1.3 (H) 09/08/2022   HGBA1C 5.6 08/22/2022      Assessment / Plan:     SP Mini thorocotomy Mv repair Doing well with  trivial mr by recent echo. Will continue anticoagulation as per cardiology until afib issue resolved/controlled Rib Fx: now on cxr with evidence of partially displaced lower sternal fracture. Discussed to not lift anything more than 10lbs for 4 weeks and reassess for need for sternal plating if ongoing discomfort. Pain management as per PCP    I have spent 60 min in review of the records, viewing studies and in face to face with patient and in coordination of future care    Coralie Common 10/06/2022 2:20 PM

## 2022-10-06 NOTE — Addendum Note (Signed)
Encounter addended by: Markus Daft A on: 10/06/2022 2:54 PM  Actions taken: Imaging Exam ended

## 2022-10-06 NOTE — Telephone Encounter (Signed)
Dr Jenetta Downer, Please advise if you want to refill REQUIP.  Last ov was 07/01/22 for surgical clearance.  Thank you.

## 2022-10-07 ENCOUNTER — Other Ambulatory Visit (HOSPITAL_COMMUNITY): Payer: Medicare Other

## 2022-10-07 NOTE — Telephone Encounter (Signed)
Requip renewed

## 2022-10-08 DIAGNOSIS — N186 End stage renal disease: Secondary | ICD-10-CM | POA: Diagnosis not present

## 2022-10-08 DIAGNOSIS — E876 Hypokalemia: Secondary | ICD-10-CM | POA: Diagnosis not present

## 2022-10-08 DIAGNOSIS — D631 Anemia in chronic kidney disease: Secondary | ICD-10-CM | POA: Diagnosis not present

## 2022-10-08 DIAGNOSIS — Z992 Dependence on renal dialysis: Secondary | ICD-10-CM | POA: Diagnosis not present

## 2022-10-08 DIAGNOSIS — N2581 Secondary hyperparathyroidism of renal origin: Secondary | ICD-10-CM | POA: Diagnosis not present

## 2022-10-08 DIAGNOSIS — E079 Disorder of thyroid, unspecified: Secondary | ICD-10-CM | POA: Diagnosis not present

## 2022-10-08 DIAGNOSIS — D509 Iron deficiency anemia, unspecified: Secondary | ICD-10-CM | POA: Diagnosis not present

## 2022-10-10 DIAGNOSIS — E876 Hypokalemia: Secondary | ICD-10-CM | POA: Diagnosis not present

## 2022-10-10 DIAGNOSIS — N186 End stage renal disease: Secondary | ICD-10-CM | POA: Diagnosis not present

## 2022-10-10 DIAGNOSIS — Z992 Dependence on renal dialysis: Secondary | ICD-10-CM | POA: Diagnosis not present

## 2022-10-10 DIAGNOSIS — N2581 Secondary hyperparathyroidism of renal origin: Secondary | ICD-10-CM | POA: Diagnosis not present

## 2022-10-10 DIAGNOSIS — D509 Iron deficiency anemia, unspecified: Secondary | ICD-10-CM | POA: Diagnosis not present

## 2022-10-10 DIAGNOSIS — D631 Anemia in chronic kidney disease: Secondary | ICD-10-CM | POA: Diagnosis not present

## 2022-10-10 DIAGNOSIS — E079 Disorder of thyroid, unspecified: Secondary | ICD-10-CM | POA: Diagnosis not present

## 2022-10-13 DIAGNOSIS — N186 End stage renal disease: Secondary | ICD-10-CM | POA: Diagnosis not present

## 2022-10-13 DIAGNOSIS — D631 Anemia in chronic kidney disease: Secondary | ICD-10-CM | POA: Diagnosis not present

## 2022-10-13 DIAGNOSIS — Z992 Dependence on renal dialysis: Secondary | ICD-10-CM | POA: Diagnosis not present

## 2022-10-13 DIAGNOSIS — E079 Disorder of thyroid, unspecified: Secondary | ICD-10-CM | POA: Diagnosis not present

## 2022-10-13 DIAGNOSIS — D509 Iron deficiency anemia, unspecified: Secondary | ICD-10-CM | POA: Diagnosis not present

## 2022-10-13 DIAGNOSIS — E876 Hypokalemia: Secondary | ICD-10-CM | POA: Diagnosis not present

## 2022-10-13 DIAGNOSIS — N2581 Secondary hyperparathyroidism of renal origin: Secondary | ICD-10-CM | POA: Diagnosis not present

## 2022-10-15 DIAGNOSIS — E876 Hypokalemia: Secondary | ICD-10-CM | POA: Diagnosis not present

## 2022-10-15 DIAGNOSIS — D509 Iron deficiency anemia, unspecified: Secondary | ICD-10-CM | POA: Diagnosis not present

## 2022-10-15 DIAGNOSIS — D631 Anemia in chronic kidney disease: Secondary | ICD-10-CM | POA: Diagnosis not present

## 2022-10-15 DIAGNOSIS — N2581 Secondary hyperparathyroidism of renal origin: Secondary | ICD-10-CM | POA: Diagnosis not present

## 2022-10-15 DIAGNOSIS — Z992 Dependence on renal dialysis: Secondary | ICD-10-CM | POA: Diagnosis not present

## 2022-10-15 DIAGNOSIS — E079 Disorder of thyroid, unspecified: Secondary | ICD-10-CM | POA: Diagnosis not present

## 2022-10-15 DIAGNOSIS — N186 End stage renal disease: Secondary | ICD-10-CM | POA: Diagnosis not present

## 2022-10-17 ENCOUNTER — Other Ambulatory Visit: Payer: Self-pay | Admitting: Internal Medicine

## 2022-10-17 ENCOUNTER — Other Ambulatory Visit: Payer: Self-pay | Admitting: Cardiology

## 2022-10-17 DIAGNOSIS — D631 Anemia in chronic kidney disease: Secondary | ICD-10-CM | POA: Diagnosis not present

## 2022-10-17 DIAGNOSIS — D509 Iron deficiency anemia, unspecified: Secondary | ICD-10-CM | POA: Diagnosis not present

## 2022-10-17 DIAGNOSIS — N186 End stage renal disease: Secondary | ICD-10-CM | POA: Diagnosis not present

## 2022-10-17 DIAGNOSIS — N2581 Secondary hyperparathyroidism of renal origin: Secondary | ICD-10-CM | POA: Diagnosis not present

## 2022-10-17 DIAGNOSIS — E876 Hypokalemia: Secondary | ICD-10-CM | POA: Diagnosis not present

## 2022-10-17 DIAGNOSIS — Z992 Dependence on renal dialysis: Secondary | ICD-10-CM | POA: Diagnosis not present

## 2022-10-17 DIAGNOSIS — E079 Disorder of thyroid, unspecified: Secondary | ICD-10-CM | POA: Diagnosis not present

## 2022-10-21 ENCOUNTER — Other Ambulatory Visit: Payer: Self-pay | Admitting: Physician Assistant

## 2022-10-21 DIAGNOSIS — Z992 Dependence on renal dialysis: Secondary | ICD-10-CM | POA: Diagnosis not present

## 2022-10-21 DIAGNOSIS — E079 Disorder of thyroid, unspecified: Secondary | ICD-10-CM | POA: Diagnosis not present

## 2022-10-21 DIAGNOSIS — N186 End stage renal disease: Secondary | ICD-10-CM | POA: Diagnosis not present

## 2022-10-21 DIAGNOSIS — D509 Iron deficiency anemia, unspecified: Secondary | ICD-10-CM | POA: Diagnosis not present

## 2022-10-21 DIAGNOSIS — D631 Anemia in chronic kidney disease: Secondary | ICD-10-CM | POA: Diagnosis not present

## 2022-10-21 DIAGNOSIS — E876 Hypokalemia: Secondary | ICD-10-CM | POA: Diagnosis not present

## 2022-10-21 DIAGNOSIS — N2581 Secondary hyperparathyroidism of renal origin: Secondary | ICD-10-CM | POA: Diagnosis not present

## 2022-10-24 DIAGNOSIS — E876 Hypokalemia: Secondary | ICD-10-CM | POA: Diagnosis not present

## 2022-10-24 DIAGNOSIS — E079 Disorder of thyroid, unspecified: Secondary | ICD-10-CM | POA: Diagnosis not present

## 2022-10-24 DIAGNOSIS — N186 End stage renal disease: Secondary | ICD-10-CM | POA: Diagnosis not present

## 2022-10-24 DIAGNOSIS — D509 Iron deficiency anemia, unspecified: Secondary | ICD-10-CM | POA: Diagnosis not present

## 2022-10-24 DIAGNOSIS — N2581 Secondary hyperparathyroidism of renal origin: Secondary | ICD-10-CM | POA: Diagnosis not present

## 2022-10-24 DIAGNOSIS — Z992 Dependence on renal dialysis: Secondary | ICD-10-CM | POA: Diagnosis not present

## 2022-10-24 DIAGNOSIS — D631 Anemia in chronic kidney disease: Secondary | ICD-10-CM | POA: Diagnosis not present

## 2022-10-26 DIAGNOSIS — Z992 Dependence on renal dialysis: Secondary | ICD-10-CM | POA: Diagnosis not present

## 2022-10-26 DIAGNOSIS — N186 End stage renal disease: Secondary | ICD-10-CM | POA: Diagnosis not present

## 2022-10-26 DIAGNOSIS — I129 Hypertensive chronic kidney disease with stage 1 through stage 4 chronic kidney disease, or unspecified chronic kidney disease: Secondary | ICD-10-CM | POA: Diagnosis not present

## 2022-10-28 DIAGNOSIS — E079 Disorder of thyroid, unspecified: Secondary | ICD-10-CM | POA: Diagnosis not present

## 2022-10-28 DIAGNOSIS — D509 Iron deficiency anemia, unspecified: Secondary | ICD-10-CM | POA: Diagnosis not present

## 2022-10-28 DIAGNOSIS — D631 Anemia in chronic kidney disease: Secondary | ICD-10-CM | POA: Diagnosis not present

## 2022-10-28 DIAGNOSIS — Z992 Dependence on renal dialysis: Secondary | ICD-10-CM | POA: Diagnosis not present

## 2022-10-28 DIAGNOSIS — E876 Hypokalemia: Secondary | ICD-10-CM | POA: Diagnosis not present

## 2022-10-28 DIAGNOSIS — N2581 Secondary hyperparathyroidism of renal origin: Secondary | ICD-10-CM | POA: Diagnosis not present

## 2022-10-28 DIAGNOSIS — N186 End stage renal disease: Secondary | ICD-10-CM | POA: Diagnosis not present

## 2022-10-29 ENCOUNTER — Other Ambulatory Visit: Payer: Self-pay

## 2022-10-29 ENCOUNTER — Emergency Department (HOSPITAL_COMMUNITY): Payer: Medicare Other

## 2022-10-29 ENCOUNTER — Other Ambulatory Visit: Payer: Self-pay | Admitting: Physician Assistant

## 2022-10-29 ENCOUNTER — Encounter (HOSPITAL_COMMUNITY): Payer: Self-pay | Admitting: Emergency Medicine

## 2022-10-29 ENCOUNTER — Other Ambulatory Visit: Payer: Self-pay | Admitting: Cardiovascular Disease

## 2022-10-29 ENCOUNTER — Emergency Department (HOSPITAL_COMMUNITY)
Admission: EM | Admit: 2022-10-29 | Discharge: 2022-10-30 | Discharge status: Against Medical Advice | Payer: Medicare Other | Attending: Emergency Medicine | Admitting: Emergency Medicine

## 2022-10-29 DIAGNOSIS — J90 Pleural effusion, not elsewhere classified: Secondary | ICD-10-CM | POA: Diagnosis not present

## 2022-10-29 DIAGNOSIS — J9811 Atelectasis: Secondary | ICD-10-CM | POA: Diagnosis not present

## 2022-10-29 DIAGNOSIS — Z1152 Encounter for screening for COVID-19: Secondary | ICD-10-CM | POA: Insufficient documentation

## 2022-10-29 DIAGNOSIS — Z992 Dependence on renal dialysis: Secondary | ICD-10-CM | POA: Diagnosis not present

## 2022-10-29 DIAGNOSIS — R0602 Shortness of breath: Secondary | ICD-10-CM | POA: Diagnosis not present

## 2022-10-29 DIAGNOSIS — R61 Generalized hyperhidrosis: Secondary | ICD-10-CM | POA: Insufficient documentation

## 2022-10-29 DIAGNOSIS — E079 Disorder of thyroid, unspecified: Secondary | ICD-10-CM | POA: Diagnosis not present

## 2022-10-29 DIAGNOSIS — R110 Nausea: Secondary | ICD-10-CM | POA: Diagnosis not present

## 2022-10-29 DIAGNOSIS — Z5321 Procedure and treatment not carried out due to patient leaving prior to being seen by health care provider: Secondary | ICD-10-CM | POA: Insufficient documentation

## 2022-10-29 DIAGNOSIS — D509 Iron deficiency anemia, unspecified: Secondary | ICD-10-CM | POA: Diagnosis not present

## 2022-10-29 DIAGNOSIS — D631 Anemia in chronic kidney disease: Secondary | ICD-10-CM | POA: Diagnosis not present

## 2022-10-29 DIAGNOSIS — R0789 Other chest pain: Secondary | ICD-10-CM | POA: Diagnosis not present

## 2022-10-29 DIAGNOSIS — N2581 Secondary hyperparathyroidism of renal origin: Secondary | ICD-10-CM | POA: Diagnosis not present

## 2022-10-29 DIAGNOSIS — R079 Chest pain, unspecified: Secondary | ICD-10-CM | POA: Diagnosis not present

## 2022-10-29 DIAGNOSIS — E876 Hypokalemia: Secondary | ICD-10-CM | POA: Diagnosis not present

## 2022-10-29 DIAGNOSIS — N186 End stage renal disease: Secondary | ICD-10-CM | POA: Diagnosis not present

## 2022-10-29 LAB — COMPREHENSIVE METABOLIC PANEL
ALT: 12 U/L (ref 0–44)
AST: 15 U/L (ref 15–41)
Albumin: 3.4 g/dL — ABNORMAL LOW (ref 3.5–5.0)
Alkaline Phosphatase: 116 U/L (ref 38–126)
Anion gap: 15 (ref 5–15)
BUN: 12 mg/dL (ref 6–20)
CO2: 29 mmol/L (ref 22–32)
Calcium: 8.6 mg/dL — ABNORMAL LOW (ref 8.9–10.3)
Chloride: 93 mmol/L — ABNORMAL LOW (ref 98–111)
Creatinine, Ser: 4.57 mg/dL — ABNORMAL HIGH (ref 0.61–1.24)
GFR, Estimated: 14 mL/min — ABNORMAL LOW (ref >60)
Glucose, Bld: 90 mg/dL (ref 70–99)
Potassium: 3.3 mmol/L — ABNORMAL LOW (ref 3.5–5.1)
Sodium: 137 mmol/L (ref 135–145)
Total Bilirubin: 0.5 mg/dL (ref 0.3–1.2)
Total Protein: 8 g/dL (ref 6.5–8.1)

## 2022-10-29 LAB — CBC WITH DIFFERENTIAL/PLATELET
Abs Immature Granulocytes: 0.04 10*3/uL (ref 0.00–0.07)
Basophils Absolute: 0.1 10*3/uL (ref 0.0–0.1)
Basophils Relative: 1 %
Eosinophils Absolute: 0.1 10*3/uL (ref 0.0–0.5)
Eosinophils Relative: 1 %
HCT: 32.5 % — ABNORMAL LOW (ref 39.0–52.0)
Hemoglobin: 9.9 g/dL — ABNORMAL LOW (ref 13.0–17.0)
Immature Granulocytes: 0 %
Lymphocytes Relative: 14 %
Lymphs Abs: 1.5 10*3/uL (ref 0.7–4.0)
MCH: 28.2 pg (ref 26.0–34.0)
MCHC: 30.5 g/dL (ref 30.0–36.0)
MCV: 92.6 fL (ref 80.0–100.0)
Monocytes Absolute: 1.1 10*3/uL — ABNORMAL HIGH (ref 0.1–1.0)
Monocytes Relative: 11 %
Neutro Abs: 7.7 10*3/uL (ref 1.7–7.7)
Neutrophils Relative %: 73 %
Platelets: 392 10*3/uL (ref 150–400)
RBC: 3.51 MIL/uL — ABNORMAL LOW (ref 4.22–5.81)
RDW: 15.4 % (ref 11.5–15.5)
WBC: 10.5 10*3/uL (ref 4.0–10.5)
nRBC: 0 % (ref 0.0–0.2)

## 2022-10-29 LAB — RESP PANEL BY RT-PCR (RSV, FLU A&B, COVID)  RVPGX2
Influenza A by PCR: NEGATIVE
Influenza B by PCR: NEGATIVE
Resp Syncytial Virus by PCR: NEGATIVE
SARS Coronavirus 2 by RT PCR: NEGATIVE

## 2022-10-29 LAB — TROPONIN I (HIGH SENSITIVITY): Troponin I (High Sensitivity): 31 ng/L — ABNORMAL HIGH (ref <18)

## 2022-10-29 NOTE — ED Triage Notes (Signed)
Pt reports chest pain that started on Monday. Pt reports he had valve replacement in October.

## 2022-10-29 NOTE — ED Provider Triage Note (Signed)
Emergency Medicine Provider Triage Evaluation Note  Albert Eaton , a 57 y.o. male  was evaluated in triage.  Pt complains of chest pain.  Patient reports that he had a sudden onset of chest pain began approximately 2 days ago.  He describes chest pain as localized in the center of his chest without any radiation to shoulders or neck.  Has a prior history of MI.  Reports some nausea and diaphoresis.  Also increased shortness of breath.  Not on thinners.  Review of Systems  Positive: As above Negative: As above  Physical Exam  BP 126/89 (BP Location: Right Arm)   Pulse 85   Temp 99.8 F (37.7 C) (Oral)   Resp 19   SpO2 96%  Gen:   Awake, no distress   Resp:  Normal effort clear to auscultation bilaterally MSK:   Moves extremities without difficulty  Other:  No obvious murmurs or rubs  Medical Decision Making  Medically screening exam initiated at 6:47 PM.  Appropriate orders placed.  Albert Eaton was informed that the remainder of the evaluation will be completed by another provider, this initial triage assessment does not replace that evaluation, and the importance of remaining in the ED until their evaluation is complete.     Luvenia Heller, PA-C 10/29/22 [phone]

## 2022-10-30 DIAGNOSIS — R079 Chest pain, unspecified: Secondary | ICD-10-CM | POA: Diagnosis not present

## 2022-10-30 LAB — TROPONIN I (HIGH SENSITIVITY): Troponin I (High Sensitivity): 30 ng/L — ABNORMAL HIGH (ref <18)

## 2022-10-30 NOTE — ED Notes (Signed)
Patient left on own accord °

## 2022-10-31 DIAGNOSIS — N186 End stage renal disease: Secondary | ICD-10-CM | POA: Diagnosis not present

## 2022-10-31 DIAGNOSIS — E079 Disorder of thyroid, unspecified: Secondary | ICD-10-CM | POA: Diagnosis not present

## 2022-10-31 DIAGNOSIS — N2581 Secondary hyperparathyroidism of renal origin: Secondary | ICD-10-CM | POA: Diagnosis not present

## 2022-10-31 DIAGNOSIS — D631 Anemia in chronic kidney disease: Secondary | ICD-10-CM | POA: Diagnosis not present

## 2022-10-31 DIAGNOSIS — Z992 Dependence on renal dialysis: Secondary | ICD-10-CM | POA: Diagnosis not present

## 2022-10-31 DIAGNOSIS — D509 Iron deficiency anemia, unspecified: Secondary | ICD-10-CM | POA: Diagnosis not present

## 2022-10-31 DIAGNOSIS — E876 Hypokalemia: Secondary | ICD-10-CM | POA: Diagnosis not present

## 2022-11-03 DIAGNOSIS — D509 Iron deficiency anemia, unspecified: Secondary | ICD-10-CM | POA: Diagnosis not present

## 2022-11-03 DIAGNOSIS — D631 Anemia in chronic kidney disease: Secondary | ICD-10-CM | POA: Diagnosis not present

## 2022-11-03 DIAGNOSIS — N2581 Secondary hyperparathyroidism of renal origin: Secondary | ICD-10-CM | POA: Diagnosis not present

## 2022-11-03 DIAGNOSIS — N186 End stage renal disease: Secondary | ICD-10-CM | POA: Diagnosis not present

## 2022-11-03 DIAGNOSIS — E079 Disorder of thyroid, unspecified: Secondary | ICD-10-CM | POA: Diagnosis not present

## 2022-11-03 DIAGNOSIS — E876 Hypokalemia: Secondary | ICD-10-CM | POA: Diagnosis not present

## 2022-11-03 DIAGNOSIS — Z992 Dependence on renal dialysis: Secondary | ICD-10-CM | POA: Diagnosis not present

## 2022-11-04 ENCOUNTER — Telehealth: Payer: Self-pay | Admitting: *Deleted

## 2022-11-04 NOTE — Patient Outreach (Signed)
  Care Coordination TOC Note Transition Care Management Follow-up Telephone Call Date of discharge and from where: [phone] ED [facility] How have you been since you were released from the hospital? Doing fine Any questions or concerns? Yes Per I waited several hours before I got back to triage. I was worried because I had heart surgery before and had a stroke in the past. After triage I left. No one should ever have to wait that long. (Time frame [id number] 15:17 left [id number] 9:17) Items Reviewed: Did the pt receive and understand the discharge instructions provided?  Patient left without being seen by DR Medications obtained and verified? No  Other? No  Any new allergies since your discharge? No  Dietary orders reviewed? No Do you have support at home? Yes   Home Care and Equipment/Supplies: Were home health services ordered? no If so, what is the name of the agency? N/a  Has the agency set up a time to come to the patient's home? not applicable Were any new equipment or medical supplies ordered?  No What is the name of the medical supply agency? N/a Were you able to get the supplies/equipment? not applicable Do you have any questions related to the use of the equipment or supplies? No  Functional Questionnaire: (I = Independent and D = Dependent) ADLs: D  Bathing/Dressing- I  Meal Prep- D  Eating- I  Maintaining continence- I  Transferring/Ambulation- I  Managing Meds- I  Follow up appointments reviewed:  PCP Hospital f/u appt confirmed? No  Patient left after triage Specialist Hospital f/u appt confirmed? No  Patient had appointment scheduled with cardiology [phone] 3 PM Are transportation arrangements needed? No  If their condition worsens, is the pt aware to call PCP or go to the Emergency Dept.? Yes Was the patient provided with contact information for the PCP's office or ED? Yes Was to pt encouraged to call back with questions or concerns? Yes  SDOH assessments and  interventions completed:   Yes   Care Coordination Interventions:  RN gave patient the [facility] number to voice his complaints    Encounter Outcome:  Pt. Visit Completed    [place] [facility] [phone]

## 2022-11-05 DIAGNOSIS — N186 End stage renal disease: Secondary | ICD-10-CM | POA: Diagnosis not present

## 2022-11-05 DIAGNOSIS — E876 Hypokalemia: Secondary | ICD-10-CM | POA: Diagnosis not present

## 2022-11-05 DIAGNOSIS — D509 Iron deficiency anemia, unspecified: Secondary | ICD-10-CM | POA: Diagnosis not present

## 2022-11-05 DIAGNOSIS — Z992 Dependence on renal dialysis: Secondary | ICD-10-CM | POA: Diagnosis not present

## 2022-11-05 DIAGNOSIS — N2581 Secondary hyperparathyroidism of renal origin: Secondary | ICD-10-CM | POA: Diagnosis not present

## 2022-11-05 DIAGNOSIS — D631 Anemia in chronic kidney disease: Secondary | ICD-10-CM | POA: Diagnosis not present

## 2022-11-05 DIAGNOSIS — E079 Disorder of thyroid, unspecified: Secondary | ICD-10-CM | POA: Diagnosis not present

## 2022-11-06 ENCOUNTER — Telehealth: Payer: Self-pay | Admitting: Internal Medicine

## 2022-11-06 NOTE — Telephone Encounter (Signed)
Patient is calling irritated because he has been trying to reach [place] about a refill for his Amitiza Rx and would like it to be sent to [facility] on [address]. Please advise

## 2022-11-07 DIAGNOSIS — D631 Anemia in chronic kidney disease: Secondary | ICD-10-CM | POA: Diagnosis not present

## 2022-11-07 DIAGNOSIS — E876 Hypokalemia: Secondary | ICD-10-CM | POA: Diagnosis not present

## 2022-11-07 DIAGNOSIS — N186 End stage renal disease: Secondary | ICD-10-CM | POA: Diagnosis not present

## 2022-11-07 DIAGNOSIS — D509 Iron deficiency anemia, unspecified: Secondary | ICD-10-CM | POA: Diagnosis not present

## 2022-11-07 DIAGNOSIS — Z992 Dependence on renal dialysis: Secondary | ICD-10-CM | POA: Diagnosis not present

## 2022-11-07 DIAGNOSIS — N2581 Secondary hyperparathyroidism of renal origin: Secondary | ICD-10-CM | POA: Diagnosis not present

## 2022-11-07 DIAGNOSIS — E079 Disorder of thyroid, unspecified: Secondary | ICD-10-CM | POA: Diagnosis not present

## 2022-11-07 MED ORDER — LUBIPROSTONE 24 MCG PO CAPS: 24.0000 ug | ORAL_CAPSULE | Freq: Two times a day (BID) | ORAL | 1 refills | Status: DC

## 2022-11-07 NOTE — Telephone Encounter (Signed)
Patient advised that he needs office visit. Last seen 03/2021. He has scheduled follow up for 12/2022. Refills sent until that time.

## 2022-11-10 DIAGNOSIS — D509 Iron deficiency anemia, unspecified: Secondary | ICD-10-CM | POA: Diagnosis not present

## 2022-11-10 DIAGNOSIS — N2581 Secondary hyperparathyroidism of renal origin: Secondary | ICD-10-CM | POA: Diagnosis not present

## 2022-11-10 DIAGNOSIS — D631 Anemia in chronic kidney disease: Secondary | ICD-10-CM | POA: Diagnosis not present

## 2022-11-10 DIAGNOSIS — E876 Hypokalemia: Secondary | ICD-10-CM | POA: Diagnosis not present

## 2022-11-10 DIAGNOSIS — Z992 Dependence on renal dialysis: Secondary | ICD-10-CM | POA: Diagnosis not present

## 2022-11-10 DIAGNOSIS — E079 Disorder of thyroid, unspecified: Secondary | ICD-10-CM | POA: Diagnosis not present

## 2022-11-10 DIAGNOSIS — N186 End stage renal disease: Secondary | ICD-10-CM | POA: Diagnosis not present

## 2022-11-12 DIAGNOSIS — D631 Anemia in chronic kidney disease: Secondary | ICD-10-CM | POA: Diagnosis not present

## 2022-11-12 DIAGNOSIS — Z992 Dependence on renal dialysis: Secondary | ICD-10-CM | POA: Diagnosis not present

## 2022-11-12 DIAGNOSIS — Z7682 Awaiting organ transplant status: Secondary | ICD-10-CM | POA: Diagnosis not present

## 2022-11-12 DIAGNOSIS — N186 End stage renal disease: Secondary | ICD-10-CM | POA: Diagnosis not present

## 2022-11-12 DIAGNOSIS — E079 Disorder of thyroid, unspecified: Secondary | ICD-10-CM | POA: Diagnosis not present

## 2022-11-12 DIAGNOSIS — D509 Iron deficiency anemia, unspecified: Secondary | ICD-10-CM | POA: Diagnosis not present

## 2022-11-12 DIAGNOSIS — N2581 Secondary hyperparathyroidism of renal origin: Secondary | ICD-10-CM | POA: Diagnosis not present

## 2022-11-12 DIAGNOSIS — E876 Hypokalemia: Secondary | ICD-10-CM | POA: Diagnosis not present

## 2022-11-13 ENCOUNTER — Other Ambulatory Visit: Payer: Self-pay

## 2022-11-13 ENCOUNTER — Encounter: Payer: Self-pay | Admitting: Allergy & Immunology

## 2022-11-13 ENCOUNTER — Ambulatory Visit (INDEPENDENT_AMBULATORY_CARE_PROVIDER_SITE_OTHER): Payer: 59 | Admitting: Allergy & Immunology

## 2022-11-13 VITALS — BP 130/100 | HR 75 | Temp 98.4°F | Resp 24 | Ht 70.0 in | Wt 196.0 lb

## 2022-11-13 DIAGNOSIS — J4530 Mild persistent asthma, uncomplicated: Secondary | ICD-10-CM | POA: Diagnosis not present

## 2022-11-13 DIAGNOSIS — J3089 Other allergic rhinitis: Secondary | ICD-10-CM | POA: Diagnosis not present

## 2022-11-13 DIAGNOSIS — B20 Human immunodeficiency virus [HIV] disease: Secondary | ICD-10-CM

## 2022-11-13 MED ORDER — MUPIROCIN 2 % EX OINT: 1.0000 | TOPICAL_OINTMENT | Freq: Two times a day (BID) | CUTANEOUS | 0 refills | Status: AC

## 2022-11-13 MED ORDER — AZELASTINE HCL 0.1 % NA SOLN: 2.0000 | Freq: Two times a day (BID) | NASAL | 5 refills | Status: DC | PRN

## 2022-11-13 MED ORDER — CETIRIZINE HCL 10 MG PO TABS: 10.0000 mg | ORAL_TABLET | Freq: Every day | ORAL | 5 refills | Status: DC

## 2022-11-13 MED ORDER — BUDESONIDE-FORMOTEROL FUMARATE 160-4.5 MCG/ACT IN AERO: 2.0000 | INHALATION_SPRAY | Freq: Two times a day (BID) | RESPIRATORY_TRACT | 5 refills | Status: DC

## 2022-11-13 MED ORDER — PATADAY 0.7 % OP SOLN: 1.0000 [drp] | Freq: Two times a day (BID) | OPHTHALMIC | 5 refills | Status: AC | PRN

## 2022-11-13 MED ORDER — PANTOPRAZOLE SODIUM 40 MG PO TBEC: 40.0000 mg | DELAYED_RELEASE_TABLET | Freq: Every day | ORAL | 5 refills | Status: DC

## 2022-11-13 MED ORDER — FAMOTIDINE 20 MG PO TABS: 20.0000 mg | ORAL_TABLET | Freq: Two times a day (BID) | ORAL | 3 refills | Status: DC

## 2022-11-13 NOTE — Progress Notes (Signed)
FOLLOW UP  Date of Service/Encounter:  11/13/22   Assessment:   Mild persistent asthma, uncomplicated - with better control with daily ICS use (we do have room to increase if needed in the future)   Perennial allergic rhinitis (dust mites)   HIV - on HAART   ESRD - on transplant list    Dysgeusia - unknown cause (cleared by ENT)  Plan/Recommendations:   1. Mild persistent asthma, uncomplicated - Lung testing not done today. - Let's increase the Symbicort to two puffs twice daily for now to control your coughing more during the day.  - Daily controller medication(s): Symbicort 160/4.63mg two puffs TWICE DAILY with spacer - Prior to physical activity: albuterol 2 puffs 10-15 minutes before physical activity. - Rescue medications: albuterol 4 puffs every 4-6 hours as needed - Changes during respiratory infections or worsening symptoms: Add on Flovent 1162m to 2 puffs twice daily for TWO WEEKS. - Asthma control goals:  * Full participation in all desired activities (may need albuterol before activity) * Albuterol use two time or less a week on average (not counting use with activity) * Cough interfering with sleep two time or less a month * Oral steroids no more than once a year * No hospitalizations  2. Perennial allergic conjunctivitis of both eyes - Continue with Pataday one drop per eye twice daily. - Continue with Systane eye drops as needed. - Continue with azelastine as needed. - Continue with ipratropium as needed.  - Stop the Clarinex and start Zyrtec (cetirizine) daily instead. - This might help with the postnasal drip.  - We are going to get an environmental allergy panel via the blood (this can be drawn when your next dialysis is due).   3. Return in about 6 months (around 05/14/2023).    Subjective:   KeBrodee Mauritzarch is a [age].o. male presenting today for follow up of  Chief Complaint  Patient presents with   Follow-up    6 month follow for asthma pt  c/o having some breathing issues and also some sinus issues.    KeAdyan Palauarch has a history of the following: Patient Active Problem List   Diagnosis Date Noted   Cardiac arrest (HCAustintown11/13/2023   Acute cerebrovascular insufficiency 08/28/2022   Cerebrovascular accident (CVA) due to embolism of left cerebellar artery (HCC)    S/P MVR (mitral valve repair) 08/21/2022   Chronic pain of right ankle 06/26/2022   Autonomic neuropathy in diseases classified elsewhere 04/21/2022   PAF (paroxysmal atrial fibrillation) (HCMilling[place]02/03/2022   Laryngopharyngeal reflux (LPR) 11/11/2021   Chest wall pain    Elevated troponin    NSTEMI (non-ST elevated myocardial infarction) (HCR[place]ynolds11/05/2021   Essential hypertension 09/03/2021   Renal cell carcinoma (HCVineyard09/10/2020   Generalized (acute) peritonitis (HCQuogue08/29/2022   Other disorders of bilirubin metabolism 06/24/2021   Renal mass 02/18/2021   Pain, unspecified 02/08/2021   SVT (supraventricular tachycardia) 01/18/2021   Lower abdominal pain 06/26/2020   Contact with and (suspected) exposure to tuberculosis 04/02/2020   Fluid overload, unspecified 04/02/2020   Pulsatile tinnitus of right ear 11/21/2019   Kidney failure 0149/44/9675 Acute diastolic CHF (congestive heart failure) (HCTiki Island01/25/2021   Chills (without fever) 11/07/2019   Hypercalcemia 10/17/2019   Dependence on renal dialysis (HCQuay08/07/2019   Coagulation defect, unspecified (HCNicholls07/27/2020   Iron deficiency anemia, unspecified 05/16/2019   Anemia in other chronic diseases classified elsewhere 05/13/2019   Arteriovenous fistula, acquired (HCBonaparte07/17/2020  Body mass index (BMI) 28.0-28.9, adult 05/13/2019   Crohn's disease of small intestine without complications ([place]) 84/69/6295   Encounter for screening for respiratory tuberculosis 05/13/2019   Gout, unspecified 05/13/2019   Nausea 05/13/2019   Other fatigue 05/13/2019   Secondary hyperparathyroidism of renal origin ([place])  05/13/2019   ESRD (end stage renal disease) on dialysis ([place]) 05/13/2019   Chronic diastolic (congestive) heart failure ([place]) 05/13/2019   Chronic kidney disease, unspecified 05/13/2019   Obstructive sleep apnea 05/13/2019   Discomfort of right ear 02/18/2019   Hypokalemia 06/13/2018   Abnormal CT scan, small bowel    Perennial allergic rhinitis with a predominant nonallergic component 02/09/2018   Allergic conjunctivitis 02/09/2018   Mild intermittent asthma 02/09/2018   Atherosclerosis 12/09/2017   Hypothyroidism 11/19/2017   Mixed hyperlipidemia 10/13/2017   Personal history of gout 10/13/2017   S/P cholecystectomy 10/13/2017   Congestive heart failure with LV diastolic dysfunction, NYHA class 1 ([place]) 10/13/2017   Hypothyroidism (acquired) 10/13/2017   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[place]) 07/31/2017   SOB (shortness of breath) 06/07/2017   Arthritis, gouty 01/27/2017   Chronic diastolic CHF (congestive heart failure) ([place]) 01/06/2017   OSA (obstructive sleep apnea) 01/06/2017   Abnormal electrocardiogram (ECG) (EKG) 10/31/2016   Pleuritic chest pain 10/09/2016   HIV disease ([place]) 10/09/2016   Hypertensive heart disease with CHF (congestive heart failure) ([place]) 10/09/2016   Left-sided low back pain without sciatica 05/29/2015   NYHA class 2 heart failure with preserved ejection fraction ([place]) 05/15/2015   PAH (pulmonary artery hypertension) ([place]) 05/08/2015   Severe mitral regurgitation 05/04/2015   Prolonged Q-T interval on ECG 05/04/2015    History obtained from: chart review and patient.  Ariana is a 57 y.o. male presenting for a follow up visit.  He was last seen in July 2023.  At that time, his lung testing was very stable.  We continue with Symbicort 160 mcg 2 puffs once daily as well as albuterol as needed.  He has Flovent that he adds during respiratory flares.  For his allergic rhinitis, we continue with Pataday as well as Systane, Astelin, and ipratropium.  We added on  Clarinex 5 mg daily since this was predominantly cleared by the liver.  Since the last visit, he unfortunately has gone through a lot.  He was getting dialysis in October 2023 and became nonresponsive with a GCS of 3 at the dialysis unit.  CPR was started and he received 2 rounds of epinephrine and calcium gluconate and he did recover.  He had agonal breathing.  In the ER, he was normotensive and not hypoxic.  He had a small pleural effusion and multiple fractured ribs with a CT angiogram.  Lactic acid and troponins were elevated.  He was admitted for 6 days.  He had a CT angiogram and coronary anatomy was unchanged.  He was given a dose of steroids due to wheezing.   Following this episode, he has been OK. He is still having some residual pain from breaking the ribs. It has been hard to breathe and cough and sneeze. This has not healed at all. It still hurts when he coughs or sneezes. He tries to take it carefully.   Asthma/Respiratory Symptom History: He is using Symbicort still. He is doing two puffs before bedtime. This is mostly enough to control his symptoms. He has been having rhinorrhea as well as PND. This has been going on for a while now.   He has a history of obstructive sleep  apnea and sees Dr. Ander Slade.  He does not tolerate CPAP due to claustrophobia.  Allergic Rhinitis Symptom History: He has been having rhinorrhea as well as PND. This has been going on for a while now. He is having rhinorrhea for the most part. He is on the desloratadine.  He has been on cetirizine in the past but he is unsure whether this is more helpful or not.    He is doing dialysis three times weekly.  He is still on transplant list, although with his recent administration of CPR and hospital stay, he is lower on the list.   He continues to follow with Dr. Johnnye Sima, although he tells me that he is no longer seeing Dr. Johnnye Sima since Dr. Johnnye Sima took a bureaucratic administrative job.  Review of his chart shows that  he is actually not seen infectious disease since June 2023.Marland Kitchen His last appointment was in June 2023.  He was doing well at that time and endorsed compliance with his medication regimen.  He remained on Biktarvy.  His last HIV quantitative PCR was undetectable.  His last CD4 count was 694 in November 2023. He did have an HIV level of 11,900 in [place] 2023; he tells me that his numbers were taken when he was off of the [place].  He was taken off of all of his medications during the hospital stay around that time.  He does not remember why he was hospitalized.   He remains on dialysis 3 times a week.  He continues to follow with Dr. Rodman Key.  He has a history of renal cell carcinoma that was removed in April 2022.  Kidney biopsies have shown nephro sclerosis and interstitial fibrosis.  He has been on the transplant list for 4 years.  The average wait time is 4 to 7 years, but he tells me that his blood time typically has to wait even longer.  Otherwise, there have been no changes to his past medical history, surgical history, family history, or social history.    Review of Systems  Constitutional:  Negative for chills, fever, malaise/fatigue and weight loss.  HENT: Negative.  Negative for congestion, ear discharge, ear pain and sinus pain.        Positive for postnasal drip.  Eyes:  Negative for pain, discharge and redness.  Respiratory:  Negative for cough, sputum production, shortness of breath, wheezing and stridor.        Positive for chest pain secondary to rib fractures.  Cardiovascular: Negative.  Negative for chest pain and palpitations.  Gastrointestinal:  Negative for abdominal pain, constipation, diarrhea, heartburn, nausea and vomiting.  Skin: Negative.  Negative for itching and rash.  Neurological:  Negative for dizziness and headaches.  Endo/Heme/Allergies:  Positive for environmental allergies. Does not bruise/bleed easily.       Objective:   Blood pressure (!) 130/100, pulse  75, temperature 98.4 F (36.9 C), resp. rate (!) 24, height '5\' 10"'$  (1.778 m), weight 196 lb (88.9 kg), SpO2 96 %. Body mass index is 28.12 kg/m.    Physical Exam Vitals reviewed.  Constitutional:      Appearance: He is well-developed.     Comments: Very pleasant.  HENT:     Head: Normocephalic and atraumatic.     Right Ear: Tympanic membrane, ear canal and external ear normal.     Left Ear: Tympanic membrane, ear canal and external ear normal.     Nose: No nasal deformity, septal deviation, mucosal edema or rhinorrhea.  Right Turbinates: Enlarged, swollen and pale.     Left Turbinates: Enlarged, swollen and pale.     Right Sinus: No maxillary sinus tenderness or frontal sinus tenderness.     Left Sinus: No maxillary sinus tenderness or frontal sinus tenderness.     Comments: No nasal polyps.    Mouth/Throat:     Lips: Pink.     Mouth: Mucous membranes are moist. Mucous membranes are not pale and not dry.     Pharynx: Uvula midline.     Comments: Moderate cobblestoning. Eyes:     General: Lids are normal. Allergic shiner present.        Right eye: No discharge.        Left eye: No discharge.     Conjunctiva/sclera: Conjunctivae normal.     Right eye: Right conjunctiva is not injected. No chemosis.    Left eye: Left conjunctiva is not injected. No chemosis.    Pupils: Pupils are equal, round, and reactive to light.  Cardiovascular:     Rate and Rhythm: Normal rate and regular rhythm.     Heart sounds: Normal heart sounds.  Pulmonary:     Effort: Pulmonary effort is normal. No tachypnea, accessory muscle usage or respiratory distress.     Breath sounds: No decreased breath sounds, wheezing, rhonchi or rales.  Chest:     Chest wall: No tenderness.     Comments: Chest pain to palpation. Lymphadenopathy:     Cervical: No cervical adenopathy.  Skin:    General: Skin is warm.     Capillary Refill: Capillary refill takes less than 2 seconds.     Coloration: Skin is not  pale.     Findings: No abrasion, erythema, petechiae or rash. Rash is not papular, urticarial or vesicular.     Comments: Fistula in the left upper arm.  Neurological:     Mental Status: He is alert.  Psychiatric:        Behavior: Behavior is cooperative.      Diagnostic studies: labs sent instead      Salvatore Marvel, MD  [facility]

## 2022-11-13 NOTE — Patient Instructions (Addendum)
1. Mild persistent asthma, uncomplicated - Lung testing not done today. - Let's increase the Symbicort to two puffs twice daily for now to control your coughing more during the day.  - Daily controller medication(s): Symbicort 160/4.54mg two puffs TWICE DAILY with spacer - Prior to physical activity: albuterol 2 puffs 10-15 minutes before physical activity. - Rescue medications: albuterol 4 puffs every 4-6 hours as needed - Changes during respiratory infections or worsening symptoms: Add on Flovent 1131m to 2 puffs twice daily for TWO WEEKS. - Asthma control goals:  * Full participation in all desired activities (may need albuterol before activity) * Albuterol use two time or less a week on average (not counting use with activity) * Cough interfering with sleep two time or less a month * Oral steroids no more than once a year * No hospitalizations  2. Perennial allergic conjunctivitis of both eyes - Continue with Pataday one drop per eye twice daily. - Continue with Systane eye drops as needed. - Continue with azelastine as needed. - Continue with ipratropium as needed.  - Stop the Clarinex and start Zyrtec (cetirizine) daily instead. - This might help with the postnasal drip.  - We are going to get an environmental allergy panel via the blood (this can be drawn when your next dialysis is due).   3. Return in about 6 months (around 05/14/2023).    Please inform usKoreaf any Emergency Department visits, hospitalizations, or changes in symptoms. Call usKoreaefore going to the ED for breathing or allergy symptoms since we might be able to fit you in for a sick visit. Feel free to contact usKoreanytime with any questions, problems, or concerns.  It was a pleasure to see you again today!  Websites that have reliable patient information: 1. American Academy of Asthma, Allergy, and Immunology: www.aaaai.org 2. Food Allergy Research and Education (FARE): foodallergy.org 3. Mothers of Asthmatics:  http://www.asthmacommunitynetwork.org 4. American College of Allergy, Asthma, and Immunology: www.acaai.org   COVID-19 Vaccine Information can be found at: [email or web address] questions related to vaccine distribution or appointments, please email [email or web address] or call [phone]    "Like" usKorean Facebook and Instagram for our latest updates!       Make sure you are registered to vote! If you have moved or changed any of your contact information, you will need to get this updated before voting!  In some cases, you MAY be able to register to vote online: [email or web address]

## 2022-11-14 DIAGNOSIS — N186 End stage renal disease: Secondary | ICD-10-CM | POA: Diagnosis not present

## 2022-11-14 DIAGNOSIS — E079 Disorder of thyroid, unspecified: Secondary | ICD-10-CM | POA: Diagnosis not present

## 2022-11-14 DIAGNOSIS — D631 Anemia in chronic kidney disease: Secondary | ICD-10-CM | POA: Diagnosis not present

## 2022-11-14 DIAGNOSIS — N2581 Secondary hyperparathyroidism of renal origin: Secondary | ICD-10-CM | POA: Diagnosis not present

## 2022-11-14 DIAGNOSIS — D509 Iron deficiency anemia, unspecified: Secondary | ICD-10-CM | POA: Diagnosis not present

## 2022-11-14 DIAGNOSIS — E876 Hypokalemia: Secondary | ICD-10-CM | POA: Diagnosis not present

## 2022-11-14 DIAGNOSIS — Z992 Dependence on renal dialysis: Secondary | ICD-10-CM | POA: Diagnosis not present

## 2022-11-16 LAB — ALLERGENS W/COMP RFLX AREA 2
Alternaria Alternata IgE: <0.1 kU/L
Aspergillus Fumigatus IgE: <0.1 kU/L
Bermuda Grass IgE: <0.1 kU/L
Cedar, Mountain IgE: <0.1 kU/L
Cladosporium Herbarum IgE: <0.1 kU/L
Cockroach, German IgE: <0.1 kU/L
Common Silver Birch IgE: <0.1 kU/L
Cottonwood IgE: <0.1 kU/L
D Farinae IgE: <0.1 kU/L
D Pteronyssinus IgE: <0.1 kU/L
E001-IgE Cat Dander: <0.1 kU/L
E005-IgE Dog Dander: <0.1 kU/L
Elm, American IgE: <0.1 kU/L
IgE (Immunoglobulin E), Serum: 34 IU/mL (ref 6–495)
Johnson Grass IgE: <0.1 kU/L
Maple/Box Elder IgE: <0.1 kU/L
Mouse Urine IgE: <0.1 kU/L
Oak, White IgE: <0.1 kU/L
Pecan, Hickory IgE: <0.1 kU/L
Penicillium Chrysogen IgE: <0.1 kU/L
Pigweed, Rough IgE: <0.1 kU/L
Ragweed, Short IgE: <0.1 kU/L
Sheep Sorrel IgE Qn: <0.1 kU/L
Timothy Grass IgE: <0.1 kU/L
White Mulberry IgE: <0.1 kU/L

## 2022-11-16 LAB — ALLERGEN PROFILE, MOLD
Aureobasidi Pullulans IgE: <0.1 kU/L
Candida Albicans IgE: <0.1 kU/L
M009-IgE Fusarium proliferatum: <0.1 kU/L
M014-IgE Epicoccum purpur: <0.1 kU/L
Mucor Racemosus IgE: <0.1 kU/L
Phoma Betae IgE: <0.1 kU/L
Setomelanomma Rostrat: <0.1 kU/L
Stemphylium Herbarum IgE: <0.1 kU/L

## 2022-11-16 LAB — HIV-1 RNA QUANT-NO REFLEX-BLD: HIV-1 RNA Viral Load: <20 copies/mL

## 2022-11-17 DIAGNOSIS — D631 Anemia in chronic kidney disease: Secondary | ICD-10-CM | POA: Diagnosis not present

## 2022-11-17 DIAGNOSIS — N186 End stage renal disease: Secondary | ICD-10-CM | POA: Diagnosis not present

## 2022-11-17 DIAGNOSIS — Z992 Dependence on renal dialysis: Secondary | ICD-10-CM | POA: Diagnosis not present

## 2022-11-17 DIAGNOSIS — E079 Disorder of thyroid, unspecified: Secondary | ICD-10-CM | POA: Diagnosis not present

## 2022-11-17 DIAGNOSIS — D509 Iron deficiency anemia, unspecified: Secondary | ICD-10-CM | POA: Diagnosis not present

## 2022-11-17 DIAGNOSIS — E876 Hypokalemia: Secondary | ICD-10-CM | POA: Diagnosis not present

## 2022-11-17 DIAGNOSIS — N2581 Secondary hyperparathyroidism of renal origin: Secondary | ICD-10-CM | POA: Diagnosis not present

## 2022-11-18 ENCOUNTER — Encounter: Payer: Self-pay | Admitting: Cardiovascular Disease

## 2022-11-18 ENCOUNTER — Ambulatory Visit: Payer: 59 | Attending: Cardiovascular Disease | Admitting: Cardiovascular Disease

## 2022-11-18 ENCOUNTER — Ambulatory Visit: Payer: Medicare Other | Admitting: Cardiovascular Disease

## 2022-11-18 VITALS — BP 120/84 | HR 75 | Ht 70.0 in | Wt 194.4 lb

## 2022-11-18 DIAGNOSIS — I480 Paroxysmal atrial fibrillation: Secondary | ICD-10-CM

## 2022-11-18 DIAGNOSIS — I10 Essential (primary) hypertension: Secondary | ICD-10-CM

## 2022-11-18 DIAGNOSIS — I5032 Chronic diastolic (congestive) heart failure: Secondary | ICD-10-CM | POA: Diagnosis not present

## 2022-11-18 DIAGNOSIS — I340 Nonrheumatic mitral (valve) insufficiency: Secondary | ICD-10-CM

## 2022-11-18 NOTE — Progress Notes (Signed)
Cardiology Office Note:    Date:  11/24/2022   ID:  Albert Eaton, DOB [birth year], MRN [id number]  PCP:  [name], NP   [place] Providers Cardiologist:  [name], MD Electrophysiologist:  Vickie Epley, MD     Referring MD: [name], NP   Chief Complaint  Patient presents with   Shortness of Breath    History of Present Illness:    Albert Eaton is a 57 y.o. male with a hx of end-stage renal disease on hemodialysis, chronic heart failure with preserved ejection fraction, severe mitral regurgitation status post mitral valve repair October 2023, paroxysmal atrial fibrillation on amiodarone and apixaban, and hypertension.  The patient presents today for follow-up evaluation.  The patient was recovering at home from his heart surgery which was performed in October 2023.  He was at hemodialysis September 08, 2022 when he developed a cardiac arrest.  Unclear initial rhythm but reported SVT versus atrial fibrillation.  Patient was pulseless and required CPR and 2 rounds of epinephrine.  He recovered well with the exception of severe sternal pain from rib fractures.  He continues to have some chest discomfort but states that it is slowly improving.  The patient underwent repeat cardiac catheterization to make sure there were no no coronary abnormalities and this demonstrated stable coronary anatomy with no change.  The patient is here alone today.  He is still having some pleuritic chest discomfort related to his rib fractures, but states this is improving.  No shortness of breath, orthopnea, PND, or leg pain.  Has had no lightheadedness or syncope.  Renal transplant evaluation remains on hold after his cardiac surgery, but he has a follow-up with them in the near future.  Patient reports compliance with his medications.  No bleeding problems on apixaban.  He remains on amiodarone.  Past Medical History:  Diagnosis Date   Anemia    Aortic atherosclerosis (HCC)     Arthritis    Asthma    Cancer ([place])    renal cyst   Chronic diastolic CHF (congestive heart failure) ([place]) 01/06/2017   Echo 7/16 [facility] in [place], [name]) Mild AI, mild LAE, mild concentric LVH, EF 55, normal wall motion, mild to moderate MR, mild PI, RVSP 55 // Echo 10/09/16 ([facility]):  Moderate LVH, grade 2 diastolic dysfunction, mild MR, moderate LAE    Esophagitis    ESRD (end stage renal disease) on dialysis [facility])    Dialysis Mon Wed Fri   GERD (gastroesophageal reflux disease)    Gout    no current problems   Hepatitis    Hep B   HIV (human immunodeficiency virus infection) ([place])    HLD (hyperlipidemia)    Hypertension    Hypothyroidism    Mitral regurgitation    Neuropathy    Sleep apnea    uses c-pap   Wears glasses    Wears partial dentures     Past Surgical History:  Procedure Laterality Date   AV FISTULA PLACEMENT Left 07/02/2018   Procedure: Creation of Left arm BRACHIOBASILIC ARTERIOVENOUS  FISTULA;  Surgeon: Marty Heck, MD;  Location: [place];  Service: Vascular;  Laterality: Left;   [place] Left 08/27/2018   Procedure: Left arm BRACHIOBASILIC VEIN TRANSPOSITION SECOND STAGE;  Surgeon: Marty Heck, MD;  Location: [place];  Service: Vascular;  Laterality: Left;   BUBBLE STUDY  09/03/2020   Procedure: BUBBLE STUDY;  Surgeon: Fay Records, MD;  Location:  [place] [facility];  Service: Cardiovascular;;   BUBBLE STUDY  06/03/2022   Procedure: BUBBLE STUDY;  Surgeon: Elouise Munroe, MD;  Location: [place];  Service: Cardiology;;   CAPD INSERTION N/A 05/30/2021   Procedure: LAPAROSCOPIC INSERTION CONTINUOUS AMBULATORY PERITONEAL DIALYSIS  (CAPD) CATHETER;  Surgeon: [name], MD;  Location: [place];  Service: Vascular;  Laterality: N/A;   CAPD REMOVAL N/A 02/13/2022   Procedure: PERITONEAL DIALYSIS CATHETER REMOVAL;  Surgeon: [name], MD;  [facility];  Service: Vascular;  Laterality: N/A;   CHOLECYSTECTOMY      COLONOSCOPY W/ BIOPSIES AND POLYPECTOMY     GIVENS CAPSULE STUDY N/A 03/01/2018   Procedure: GIVENS CAPSULE STUDY;  Surgeon: Jerene Bears, MD;  Location: [place];  Service: Gastroenterology;  Laterality: N/A;   HERNIA REPAIR     As baby   LEFT HEART CATH AND CORONARY ANGIOGRAPHY N/A 09/11/2022   Procedure: LEFT HEART CATH AND CORONARY ANGIOGRAPHY;  Surgeon: Jettie Booze, MD;  Location: [place] [facility];  Service: Cardiovascular;  Laterality: N/A;   MITRAL VALVE REPAIR N/A 08/21/2022   Procedure: MINIMALLY INVASIVE MITRAL VALVE REPAIR USING RING ANNULOPLASTY SIMULUS 42m;  Surgeon: WCoralie Common MD;  [place]  Service: Open Heart Surgery;  Laterality: N/A;   MULTIPLE TOOTH EXTRACTIONS     NEPHRECTOMY Left 02/18/2021   NOSE SURGERY     RENAL BIOPSY     RIGHT/LEFT HEART CATH AND CORONARY ANGIOGRAPHY N/A 09/04/2021   Procedure: RIGHT/LEFT HEART CATH AND CORONARY ANGIOGRAPHY;  Surgeon: MBurnell Blanks MD;  [place];  Service: Cardiovascular;  Laterality: N/A;   RIGHT/LEFT HEART CATH AND CORONARY ANGIOGRAPHY N/A 08/01/2022   Procedure: RIGHT/LEFT HEART CATH AND CORONARY ANGIOGRAPHY;  Surgeon: CSherren Mocha MD;  [place] [facility];  Service: Cardiovascular;  Laterality: N/A;   TEE WITHOUT CARDIOVERSION N/A 09/03/2020   Procedure: TRANSESOPHAGEAL ECHOCARDIOGRAM (TEE);  Surgeon: RFay Records MD;  [place]  Service: Cardiovascular;  Laterality: N/A;   TEE WITHOUT CARDIOVERSION N/A 06/03/2022   Procedure: TRANSESOPHAGEAL ECHOCARDIOGRAM (TEE);  Surgeon: AElouise Munroe MD;  Location[place]  Service: Cardiology;  Laterality: N/A;   TEE WITHOUT CARDIOVERSION N/A 08/21/2022   Procedure: TRANSESOPHAGEAL ECHOCARDIOGRAM (TEE);  Surgeon: WCoralie Common MD;  [place]  Service: Open Heart Surgery;  Laterality: N/A;   UPPER GI ENDOSCOPY  04/05/2021    Current Medications: Current Meds  Medication Sig    amiodarone (PACERONE) 200 MG tablet Take 1 tablet (200 mg total) by mouth daily.   amLODipine (NORVASC) 10 MG tablet Take 1 tablet (10 mg total) by mouth daily.   azelastine (ASTELIN) 0.1 % nasal spray Place 2 sprays into both nostrils 2 (two) times daily as needed for allergies.   B Complex-C-Folic Acid (DIALYVITE PO) Take 1 tablet by mouth daily.   B Complex-C-Zn-Folic Acid (DIALYVITE 8268-TMHD15 PO) Take 1 tablet by mouth daily.   BIKTARVY 50-200-25 MG TABS tablet TAKE ONE TABLET BY MOUTH EVERY DAY   budesonide-formoterol (SYMBICORT) 160-4.5 MCG/ACT inhaler Inhale 2 puffs into the lungs in the morning and at bedtime.   calcitRIOL (ROCALTROL) 0.25 MCG capsule Take 7 capsules (1.75 mcg total) by mouth every Monday, Wednesday, and Friday with hemodialysis.   cetirizine (ZYRTEC) 10 MG tablet Take 1 tablet (10 mg total) by mouth daily.   diclofenac Sodium (VOLTAREN) 1 % GEL Apply 2 g topically 4 (four) times daily. Apply to chest pain when lidocaine patch not  in place   Doxercalciferol (HECTOROL IV) as directed. Dialysis   famotidine (PEPCID) 20 MG tablet Take 1 tablet (20 mg total) by mouth 2 (two) times daily.   ferric citrate (AURYXIA) 1 GM 210 MG(Fe) tablet Take 420 mg by mouth See admin instructions. Take 420 mg by mouth three times a day with meals and 210 mg with each snack   fluticasone (FLOVENT HFA) 110 MCG/ACT inhaler Inhale 2 puffs into the lungs 2 (two) times daily as needed ("for flares").   gabapentin (NEURONTIN) 300 MG capsule Take 1 capsule (300 mg total) by mouth daily as needed (pain/neuropathy).   ipratropium (ATROVENT HFA) 17 MCG/ACT inhaler Inhale into the lungs as needed for wheezing.   ipratropium (ATROVENT) 0.06 % nasal spray USE 2 SPRAYS IN EACH NOSTRIL 2 TO 3 TIMES DAILY AS NEEDED   levalbuterol (XOPENEX HFA) 45 MCG/ACT inhaler Inhale 2 puffs into the lungs every 4 (four) hours as needed for wheezing.   levothyroxine (SYNTHROID) 25 MCG tablet Take 25 mcg by mouth daily  before breakfast.   lubiprostone (AMITIZA) 24 MCG capsule Take 1 capsule (24 mcg total) by mouth 2 (two) times daily with a meal. NEEDS OFFICE VISIT FOR FURTHER REFILLS   Methoxy PEG-Epoetin Beta (MIRCERA IJ) Inject 1 each into the skin every 30 (thirty) days. Administered at dialysis center once a month   metoprolol tartrate (LOPRESSOR) 25 MG tablet Take 1 tablet (25 mg total) by mouth 2 (two) times daily.   mupirocin ointment (BACTROBAN) 2 % Apply 1 Application topically 2 (two) times daily for 14 days. Apply to nostrils using a Qtip.   Olopatadine HCl (PATADAY) 0.7 % SOLN Place 1 drop into both eyes 2 (two) times daily as needed (allergies).   pantoprazole (PROTONIX) 40 MG tablet Take 1 tablet (40 mg total) by mouth daily.   rOPINIRole (REQUIP) 1 MG tablet TAKE ONE TABLET BY MOUTH AT BEDTIME   rosuvastatin (CRESTOR) 10 MG tablet TAKE ONE TABLET BY MOUTH DAILY   SENSIPAR 60 MG tablet Take 60 mg by mouth See admin instructions. Take 60 mg by mouth with supper after dialysis treatments on Mon/Wed/Fri   Spacer/Aero-Holding Chambers DEVI 1 Device by Does not apply route 2 (two) times a day.   Vitamin D, Ergocalciferol, (DRISDOL) 1.25 MG (50000 UNIT) CAPS capsule Take 50,000 Units by mouth every Tuesday.     Allergies:   Ace inhibitors   Social History   Socioeconomic History   Marital status: Single    Spouse name: Not on file   Number of children: Not on file   Years of education: Not on file   Highest education level: Not on file  Occupational History   Not on file  Tobacco Use   Smoking status: Former    Packs/day: 1.00    Years: 18.00    Total pack years: 18.00    Types: Cigarettes    Quit date: 2000    Years since quitting: 24.0   Smokeless tobacco: Never  Vaping Use   Vaping Use: Never used  Substance and Sexual Activity   Alcohol use: Not Currently   Drug use: No   Sexual activity: Not Currently    Partners: Male  Other Topics Concern   Not on file  Social History  Narrative   Disability      [place]: none   Social Determinants of Health   Financial Resource Strain: Low Risk  (07/08/2022)   Overall Financial Resource Strain (CARDIA)    Difficulty of Paying  Living Expenses: Not hard at all  Food Insecurity: No Food Insecurity (09/23/2022)   Hunger Vital Sign    Worried About Running Out of Food in the Last Year: Never true    [place] in the Last Year: Never true  Recent Concern: Food Insecurity - Food Insecurity Present (07/08/2022)   Hunger Vital Sign    Worried About Running Out of Food in the Last Year: Sometimes true    Ran Out of Food in the Last Year: Sometimes true  Transportation Needs: No Transportation Needs (09/23/2022)   PRAPARE - Hydrologist (Medical): No    Lack of Transportation (Non-Medical): No  Physical Activity: Insufficiently Active (07/08/2022)   Exercise Vital Sign    Days of Exercise per Week: 2 days    Minutes of Exercise per Session: 60 min  Stress: No Stress Concern Present (07/08/2022)   [place]    Feeling of Stress : Not at all  Social Connections: Socially Isolated (07/08/2022)   Social Connection and Isolation Panel [NHANES]    Frequency of Communication with Friends and Family: More than three times a week    Frequency of Social Gatherings with Friends and Family: More than three times a week    Attends Religious Services: Never    Marine scientist or Organizations: No    Attends Music therapist: Never    Marital Status: Never married     Family History: The patient's family history includes Alcohol abuse in his father; Heart attack (age of onset: 79) in his maternal grandmother; Heart failure in his mother; Hypertension in his brother, father, mother, sister, and sister; Multiple sclerosis in his sister; Scoliosis in an other family member. There is no history of Allergic rhinitis,  Angioedema, Asthma, Eczema, Immunodeficiency, Urticaria, Colon cancer, Pancreatic cancer, or Esophageal cancer.  ROS:   Please see the history of present illness.    All other systems reviewed and are negative.  EKGs/Labs/Other Studies Reviewed:    The following studies were reviewed today: Echo 09/09/2022: 1. Left ventricular ejection fraction, by estimation, is 45 to 50%. The  left ventricle has mildly decreased function. The left ventricle  demonstrates global hypokinesis. There is moderate concentric left  ventricular hypertrophy. Left ventricular  diastolic function could not be evaluated.   2. Right ventricular systolic function was not well visualized. The right  ventricular size is moderately enlarged.   3. The mitral valve has been repaired/replaced. Trivial mitral valve  regurgitation. There is a 30 mm Medtronic prosthetic annuloplasty ring  present in the mitral position. Procedure Date: 08/21/22.   4. The aortic valve is grossly normal. Aortic valve regurgitation is not  visualized.   Comparison(s): No significant change from prior study.   EKG:  EKG is not ordered today.    Recent Labs: 02/20/2022: NT-Pro BNP 22,536 09/08/2022: B Natriuretic Peptide 2,787.4; TSH 2.868 09/13/2022: Magnesium 1.7 10/29/2022: ALT 12; BUN 12; Creatinine, Ser 4.57; Hemoglobin 9.9; Platelets 392; Potassium 3.3; Sodium 137  Recent Lipid Panel    Component Value Date/Time   CHOL 113 04/17/2022 0345   CHOL 126 05/01/2021 1055   TRIG 131 04/17/2022 0345   HDL 35 (L) 04/17/2022 0345   HDL 40 05/01/2021 1055   CHOLHDL 3.2 04/17/2022 0345   VLDL 64 (H) 09/04/2021 0347   LDLCALC 57 04/17/2022 0345   LDLDIRECT 23 08/22/2022 0901     Risk Assessment/Calculations:  CHA2DS2-VASc Score = 3   This indicates a 3.2% annual risk of stroke. The patient's score is based upon: CHF History: 1 HTN History: 1 Diabetes History: 0 Stroke History: 0 Vascular Disease History: 1 Age Score: 0 Gender  Score: 0            Physical Exam:    VS:  BP 120/84   Pulse 75   Ht '5\' 10"'$  (1.778 m)   Wt 194 lb 6.4 oz (88.2 kg)   SpO2 98%   BMI 27.89 kg/m     Wt Readings from Last 3 Encounters:  11/18/22 194 lb 6.4 oz (88.2 kg)  11/13/22 196 lb (88.9 kg)  10/06/22 195 lb (88.5 kg)     GEN:  Well nourished, well developed in no acute distress HEENT: Normal NECK: No JVD; No carotid bruits LYMPHATICS: No lymphadenopathy CARDIAC: RRR, no murmurs, rubs, gallops RESPIRATORY:  Clear to auscultation without rales, wheezing or rhonchi  ABDOMEN: Soft, non-tender, non-distended MUSCULOSKELETAL:  No edema; No deformity  SKIN: Warm and dry NEUROLOGIC:  Alert and oriented x 3 PSYCHIATRIC:  Normal affect   ASSESSMENT:    1. Paroxysmal atrial fibrillation (HCC)   2. Chronic diastolic heart failure ([place])   3. Severe mitral regurgitation   4. Essential hypertension    PLAN:    In order of problems listed above:  The patient is clinically stable on amiodarone.  He is tolerating oral anticoagulation with apixaban.  I am inclined to continue amiodarone for another 3 months and see had episodes of SVT even predating his surgery.  Once he is out beyond 3 months, will plan to discontinue amiodarone at the time of his follow-up evaluation here.  After amiodarone discontinuation, would be reasonable to pursue another monitor before stopping his anticoagulation. Doing well in this regard following mitral valve repair.  Volume is managed by hemodialysis. Patient status post minimally invasive mitral valve repair with normal function of his mitral valve postoperatively and only trivial regurgitation present. Blood pressure well-controlled on amlodipine and metoprolol.           Medication Adjustments/Labs and Tests Ordered: Current medicines are reviewed at length with the patient today.  Concerns regarding medicines are outlined above.  No orders of the defined types were placed in this  encounter.  No orders of the defined types were placed in this encounter.   Patient Instructions  Medication Instructions:  Your physician recommends that you continue on your current medications as directed. Please refer to the Current Medication list given to you today.  *If you need a refill on your cardiac medications before your next appointment, please call your pharmacy*   Lab Work: NONE If you have labs (blood work) drawn today and your tests are completely normal, you will receive your results only by: [place] (if you have MyChart) OR A paper copy in the mail If you have any lab test that is abnormal or we need to change your treatment, we will call you to review the results.   Testing/Procedures: NONE   Follow-Up: At [facility], you and your health needs are our priority.  As part of our continuing mission to provide you with exceptional heart care, we have created designated Provider Care Teams.  These Care Teams include your primary Cardiologist (physician) and Advanced Practice Providers (APPs -  Physician Assistants and Nurse Practitioners) who all work together to provide you with the care you need, when you need it.  We recommend signing up  for the patient portal called "MyChart".  Sign up information is provided on this After Visit Summary.  MyChart is used to connect with patients for Virtual Visits (Telemedicine).  Patients are able to view lab/test results, encounter notes, upcoming appointments, etc.  Non-urgent messages can be sent to your provider as well.   To learn more about what you can do with MyChart, go to [email or web address].    Your next appointment:   3 month(s)  Provider:   Daralene Milch, MD  11/24/2022 5:29 AM    [place]

## 2022-11-18 NOTE — Patient Instructions (Signed)
Medication Instructions:  Your physician recommends that you continue on your current medications as directed. Please refer to the Current Medication list given to you today.  *If you need a refill on your cardiac medications before your next appointment, please call your pharmacy*   Lab Work: NONE If you have labs (blood work) drawn today and your tests are completely normal, you will receive your results only by: [place] (if you have MyChart) OR A paper copy in the mail If you have any lab test that is abnormal or we need to change your treatment, we will call you to review the results.   Testing/Procedures: NONE   Follow-Up: At [facility], you and your health needs are our priority.  As part of our continuing mission to provide you with exceptional heart care, we have created designated Provider Care Teams.  These Care Teams include your primary Cardiologist (physician) and Advanced Practice Providers (APPs -  Physician Assistants and Nurse Practitioners) who all work together to provide you with the care you need, when you need it.  We recommend signing up for the patient portal called "MyChart".  Sign up information is provided on this After Visit Summary.  MyChart is used to connect with patients for Virtual Visits (Telemedicine).  Patients are able to view lab/test results, encounter notes, upcoming appointments, etc.  Non-urgent messages can be sent to your provider as well.   To learn more about what you can do with MyChart, go to [email or web address].    Your next appointment:   3 month(s)  Provider:   Madie Reno

## 2022-11-19 DIAGNOSIS — N186 End stage renal disease: Secondary | ICD-10-CM | POA: Diagnosis not present

## 2022-11-19 DIAGNOSIS — E876 Hypokalemia: Secondary | ICD-10-CM | POA: Diagnosis not present

## 2022-11-19 DIAGNOSIS — Z992 Dependence on renal dialysis: Secondary | ICD-10-CM | POA: Diagnosis not present

## 2022-11-19 DIAGNOSIS — N2581 Secondary hyperparathyroidism of renal origin: Secondary | ICD-10-CM | POA: Diagnosis not present

## 2022-11-19 DIAGNOSIS — D631 Anemia in chronic kidney disease: Secondary | ICD-10-CM | POA: Diagnosis not present

## 2022-11-19 DIAGNOSIS — E079 Disorder of thyroid, unspecified: Secondary | ICD-10-CM | POA: Diagnosis not present

## 2022-11-19 DIAGNOSIS — D509 Iron deficiency anemia, unspecified: Secondary | ICD-10-CM | POA: Diagnosis not present

## 2022-11-21 ENCOUNTER — Telehealth: Payer: Self-pay | Admitting: Nurse Practitioner

## 2022-11-21 DIAGNOSIS — N2581 Secondary hyperparathyroidism of renal origin: Secondary | ICD-10-CM | POA: Diagnosis not present

## 2022-11-21 DIAGNOSIS — E559 Vitamin D deficiency, unspecified: Secondary | ICD-10-CM

## 2022-11-21 DIAGNOSIS — N186 End stage renal disease: Secondary | ICD-10-CM | POA: Diagnosis not present

## 2022-11-21 DIAGNOSIS — D631 Anemia in chronic kidney disease: Secondary | ICD-10-CM | POA: Diagnosis not present

## 2022-11-21 DIAGNOSIS — E079 Disorder of thyroid, unspecified: Secondary | ICD-10-CM | POA: Diagnosis not present

## 2022-11-21 DIAGNOSIS — D509 Iron deficiency anemia, unspecified: Secondary | ICD-10-CM | POA: Diagnosis not present

## 2022-11-21 DIAGNOSIS — Z992 Dependence on renal dialysis: Secondary | ICD-10-CM | POA: Diagnosis not present

## 2022-11-21 DIAGNOSIS — E876 Hypokalemia: Secondary | ICD-10-CM | POA: Diagnosis not present

## 2022-11-21 NOTE — Telephone Encounter (Signed)
I can't see where we have checked his vitamin D level. Can we get him in for that before I refill it

## 2022-11-21 NOTE — Telephone Encounter (Signed)
Last VitD labs 2019 ROV 01/01/23  Please review, thanks!

## 2022-11-21 NOTE — Telephone Encounter (Signed)
Patient has been scheduled and lab ordered. Patient aware we will refill once we have results, and that rx may be adjusted depending on results. Nothing further needed at this time.

## 2022-11-21 NOTE — Telephone Encounter (Signed)
Prescription Request  11/21/2022  Is this a "Controlled Substance" medicine? No  LOV: 09/26/2022  What is the name of the medication or equipment? Vitamin D, Ergocalciferol, (DRISDOL) 1.25 MG (50000 UNIT) CAPS capsule   Have you contacted your pharmacy to request a refill? No   Which pharmacy would you like this sent to?  [facility], [place] [address] [place] [address] Phone: [phone] Fax: [phone]     Patient notified that their request is being sent to the clinical staff for review and that they should receive a response within 2 business days.   Please advise at Mobile [phone] (mobile)

## 2022-11-24 ENCOUNTER — Encounter: Payer: Self-pay | Admitting: Cardiovascular Disease

## 2022-11-24 ENCOUNTER — Other Ambulatory Visit: Payer: Self-pay | Admitting: Allergy & Immunology

## 2022-11-25 ENCOUNTER — Other Ambulatory Visit: Payer: 59

## 2022-11-25 ENCOUNTER — Encounter: Payer: Self-pay | Admitting: Nurse Practitioner

## 2022-11-25 ENCOUNTER — Ambulatory Visit (INDEPENDENT_AMBULATORY_CARE_PROVIDER_SITE_OTHER): Payer: 59 | Admitting: Nurse Practitioner

## 2022-11-25 ENCOUNTER — Other Ambulatory Visit: Payer: Self-pay

## 2022-11-25 VITALS — BP 128/82 | HR 113 | Temp 99.1°F | Ht 70.0 in | Wt 194.0 lb

## 2022-11-25 DIAGNOSIS — R509 Fever, unspecified: Secondary | ICD-10-CM

## 2022-11-25 DIAGNOSIS — R051 Acute cough: Secondary | ICD-10-CM

## 2022-11-25 DIAGNOSIS — G4489 Other headache syndrome: Secondary | ICD-10-CM | POA: Diagnosis not present

## 2022-11-25 DIAGNOSIS — Z113 Encounter for screening for infections with a predominantly sexual mode of transmission: Secondary | ICD-10-CM

## 2022-11-25 DIAGNOSIS — J101 Influenza due to other identified influenza virus with other respiratory manifestations: Secondary | ICD-10-CM

## 2022-11-25 DIAGNOSIS — B20 Human immunodeficiency virus [HIV] disease: Secondary | ICD-10-CM

## 2022-11-25 DIAGNOSIS — Z79899 Other long term (current) drug therapy: Secondary | ICD-10-CM

## 2022-11-25 LAB — POC INFLUENZA A&B (BINAX/QUICKVUE)
Influenza A, POC: POSITIVE — AB
Influenza B, POC: NEGATIVE

## 2022-11-25 LAB — POC SOFIA SARS ANTIGEN FIA: SARS Coronavirus 2 Ag: NEGATIVE

## 2022-11-25 NOTE — Patient Instructions (Signed)
Nice to see you today Continue using the tylenol as needed. Rest. Drink fluids as you are allowed. Let the dialysis center know. Follow up if you do not improve or get worse

## 2022-11-25 NOTE — Progress Notes (Signed)
Acute Office Visit  Subjective:     Patient ID: Albert Eaton, male    DOB: June 10, 1966, 57 y.o.   MRN: [id number]  Chief Complaint  Patient presents with   Cough    X3d, fever 103 yesterday, chills, denies shob/wheeze; tested for COVID Sunday, neg   Nasal Congestion     Patient is in today for sick symptoms with a history of heart disease, HIV, renal failure on dialysis and cardiac arrest  Symptoms started on 11/22/2022 Coivd test done on Sunday that was negative Coivd vaccine: UTD Flu vaccine: UTD Nephew was sick recently. He was seen in office yesterday per patient report  State that he is using tylenol an coricidin That has offered some relief      Review of Systems  Constitutional:  Positive for chills, fever and malaise/fatigue.       Appetite is decreased Fluid intake is as much as he can   HENT:  Negative for ear discharge, ear pain, sinus pain and sore throat.   Respiratory:  Positive for cough and shortness of breath (with coughing). Negative for sputum production.   Musculoskeletal:  Negative for joint pain and myalgias.  Neurological:  Positive for headaches.        Objective:    BP 128/82   Pulse (!) 113   Temp 99.1 F (37.3 C) (Oral)   Ht '5\' 10"'$  (1.778 m)   Wt 194 lb (88 kg)   SpO2 96%   BMI 27.84 kg/m    Physical Exam Vitals and nursing note reviewed.  Constitutional:      Appearance: Normal appearance.  HENT:     Right Ear: Tympanic membrane, ear canal and external ear normal.     Left Ear: Tympanic membrane, ear canal and external ear normal.     Nose:     Right Sinus: No maxillary sinus tenderness or frontal sinus tenderness.     Left Sinus: No maxillary sinus tenderness or frontal sinus tenderness.     Mouth/Throat:     Mouth: Mucous membranes are moist.     Pharynx: Oropharynx is clear.  Cardiovascular:     Rate and Rhythm: Normal rate and regular rhythm.     Heart sounds: Normal heart sounds.  Pulmonary:     Effort: Pulmonary  effort is normal.     Breath sounds: Normal breath sounds.  Abdominal:     General: Bowel sounds are normal.  Lymphadenopathy:     Cervical: No cervical adenopathy.  Neurological:     Mental Status: He is alert.     Results for orders placed or performed in visit on 11/25/22  POC Influenza A&B(BINAX/QUICKVUE)  Result Value Ref Range   Influenza A, POC Positive (A) Negative   Influenza B, POC Negative Negative  POC SOFIA Antigen FIA  Result Value Ref Range   SARS Coronavirus 2 Ag Negative Negative        Assessment & Plan:   Problem List Items Addressed This Visit       Respiratory   Influenza A    Flu test positive for influenza A.  Patient is outside the window for Tamiflu treatment.  Did discuss symptomatic relief inclusive of Tylenol and drinking fluid within his fluid limit.  Informed him to let his dialysis center know also.  He will follow-up if he does not improve over the next 3 to 4 days.        Other   Fever and chills    Flu  and COVID test in office.  Use over-the-counter analgesics such as Tylenol as needed      Acute cough - Primary    Flu and COVID test in office.      Relevant Orders   POC Influenza A&B(BINAX/QUICKVUE) (Completed)   POC SOFIA Antigen FIA (Completed)   Other headache syndrome    Flu and COVID test in office.  Drink fluid, rest, use Tylenol as needed       No orders of the defined types were placed in this encounter.   Return if symptoms worsen or fail to improve.  Romilda Garret, NP

## 2022-11-25 NOTE — Assessment & Plan Note (Signed)
Flu and COVID test in office.  Use over-the-counter analgesics such as Tylenol as needed

## 2022-11-25 NOTE — Assessment & Plan Note (Signed)
Flu and COVID test in office.  Drink fluid, rest, use Tylenol as needed

## 2022-11-25 NOTE — Assessment & Plan Note (Signed)
Flu test positive for influenza A.  Patient is outside the window for Tamiflu treatment.  Did discuss symptomatic relief inclusive of Tylenol and drinking fluid within his fluid limit.  Informed him to let his dialysis center know also.  He will follow-up if he does not improve over the next 3 to 4 days.

## 2022-11-25 NOTE — Assessment & Plan Note (Signed)
Flu and COVID test in office.

## 2022-11-26 DIAGNOSIS — N2581 Secondary hyperparathyroidism of renal origin: Secondary | ICD-10-CM | POA: Diagnosis not present

## 2022-11-26 DIAGNOSIS — D509 Iron deficiency anemia, unspecified: Secondary | ICD-10-CM | POA: Diagnosis not present

## 2022-11-26 DIAGNOSIS — E079 Disorder of thyroid, unspecified: Secondary | ICD-10-CM | POA: Diagnosis not present

## 2022-11-26 DIAGNOSIS — E876 Hypokalemia: Secondary | ICD-10-CM | POA: Diagnosis not present

## 2022-11-26 DIAGNOSIS — N186 End stage renal disease: Secondary | ICD-10-CM | POA: Diagnosis not present

## 2022-11-26 DIAGNOSIS — D631 Anemia in chronic kidney disease: Secondary | ICD-10-CM | POA: Diagnosis not present

## 2022-11-26 DIAGNOSIS — Z992 Dependence on renal dialysis: Secondary | ICD-10-CM | POA: Diagnosis not present

## 2022-11-26 DIAGNOSIS — I129 Hypertensive chronic kidney disease with stage 1 through stage 4 chronic kidney disease, or unspecified chronic kidney disease: Secondary | ICD-10-CM | POA: Diagnosis not present

## 2022-11-27 ENCOUNTER — Other Ambulatory Visit: Payer: Self-pay | Admitting: Allergy & Immunology

## 2022-11-28 DIAGNOSIS — E079 Disorder of thyroid, unspecified: Secondary | ICD-10-CM | POA: Diagnosis not present

## 2022-11-28 DIAGNOSIS — N186 End stage renal disease: Secondary | ICD-10-CM | POA: Diagnosis not present

## 2022-11-28 DIAGNOSIS — D509 Iron deficiency anemia, unspecified: Secondary | ICD-10-CM | POA: Diagnosis not present

## 2022-11-28 DIAGNOSIS — E876 Hypokalemia: Secondary | ICD-10-CM | POA: Diagnosis not present

## 2022-11-28 DIAGNOSIS — N2581 Secondary hyperparathyroidism of renal origin: Secondary | ICD-10-CM | POA: Diagnosis not present

## 2022-11-28 DIAGNOSIS — Z992 Dependence on renal dialysis: Secondary | ICD-10-CM | POA: Diagnosis not present

## 2022-11-28 DIAGNOSIS — D631 Anemia in chronic kidney disease: Secondary | ICD-10-CM | POA: Diagnosis not present

## 2022-12-01 DIAGNOSIS — D631 Anemia in chronic kidney disease: Secondary | ICD-10-CM | POA: Diagnosis not present

## 2022-12-01 DIAGNOSIS — Z992 Dependence on renal dialysis: Secondary | ICD-10-CM | POA: Diagnosis not present

## 2022-12-01 DIAGNOSIS — D509 Iron deficiency anemia, unspecified: Secondary | ICD-10-CM | POA: Diagnosis not present

## 2022-12-01 DIAGNOSIS — N2581 Secondary hyperparathyroidism of renal origin: Secondary | ICD-10-CM | POA: Diagnosis not present

## 2022-12-01 DIAGNOSIS — E876 Hypokalemia: Secondary | ICD-10-CM | POA: Diagnosis not present

## 2022-12-01 DIAGNOSIS — N186 End stage renal disease: Secondary | ICD-10-CM | POA: Diagnosis not present

## 2022-12-01 DIAGNOSIS — E079 Disorder of thyroid, unspecified: Secondary | ICD-10-CM | POA: Diagnosis not present

## 2022-12-02 ENCOUNTER — Other Ambulatory Visit: Payer: 59

## 2022-12-02 ENCOUNTER — Other Ambulatory Visit: Payer: Self-pay

## 2022-12-02 DIAGNOSIS — Z79899 Other long term (current) drug therapy: Secondary | ICD-10-CM

## 2022-12-02 DIAGNOSIS — Z113 Encounter for screening for infections with a predominantly sexual mode of transmission: Secondary | ICD-10-CM

## 2022-12-02 DIAGNOSIS — B20 Human immunodeficiency virus [HIV] disease: Secondary | ICD-10-CM

## 2022-12-03 ENCOUNTER — Other Ambulatory Visit: Payer: Self-pay | Admitting: Nurse Practitioner

## 2022-12-03 ENCOUNTER — Other Ambulatory Visit: Payer: Self-pay | Admitting: Allergy & Immunology

## 2022-12-03 DIAGNOSIS — Z992 Dependence on renal dialysis: Secondary | ICD-10-CM | POA: Diagnosis not present

## 2022-12-03 DIAGNOSIS — E079 Disorder of thyroid, unspecified: Secondary | ICD-10-CM | POA: Diagnosis not present

## 2022-12-03 DIAGNOSIS — E876 Hypokalemia: Secondary | ICD-10-CM | POA: Diagnosis not present

## 2022-12-03 DIAGNOSIS — D509 Iron deficiency anemia, unspecified: Secondary | ICD-10-CM | POA: Diagnosis not present

## 2022-12-03 DIAGNOSIS — N2581 Secondary hyperparathyroidism of renal origin: Secondary | ICD-10-CM | POA: Diagnosis not present

## 2022-12-03 DIAGNOSIS — D631 Anemia in chronic kidney disease: Secondary | ICD-10-CM | POA: Diagnosis not present

## 2022-12-03 DIAGNOSIS — N186 End stage renal disease: Secondary | ICD-10-CM | POA: Diagnosis not present

## 2022-12-03 LAB — COMPLETE METABOLIC PANEL WITH GFR
AG Ratio: 1.4 (calc) (ref 1.0–2.5)
ALT: 26 U/L (ref 9–46)
AST: 19 U/L (ref 10–35)
Albumin: 4.5 g/dL (ref 3.6–5.1)
Alkaline phosphatase (APISO): 155 U/L — ABNORMAL HIGH (ref 35–144)
BUN/Creatinine Ratio: 3 (calc) — ABNORMAL LOW (ref 6–22)
BUN: 26 mg/dL — ABNORMAL HIGH (ref 7–25)
CO2: 26 mmol/L (ref 20–32)
Calcium: 9 mg/dL (ref 8.6–10.3)
Chloride: 101 mmol/L (ref 98–110)
Creat: 8.29 mg/dL — ABNORMAL HIGH (ref 0.70–1.30)
Globulin: 3.3 g/dL (calc) (ref 1.9–3.7)
Glucose, Bld: 118 mg/dL — ABNORMAL HIGH (ref 65–99)
Potassium: 3.4 mmol/L — ABNORMAL LOW (ref 3.5–5.3)
Sodium: 144 mmol/L (ref 135–146)
Total Bilirubin: 0.4 mg/dL (ref 0.2–1.2)
Total Protein: 7.8 g/dL (ref 6.1–8.1)
eGFR: 7 mL/min/{1.73_m2} — ABNORMAL LOW (ref >=60)

## 2022-12-03 LAB — LIPID PANEL
Cholesterol: 135 mg/dL (ref <200)
HDL: 39 mg/dL — ABNORMAL LOW (ref >=40)
LDL Cholesterol (Calc): 77 mg/dL (calc)
Non-HDL Cholesterol (Calc): 96 mg/dL (calc) (ref <130)
Total CHOL/HDL Ratio: 3.5 (calc) (ref <5.0)
Triglycerides: 108 mg/dL (ref <150)

## 2022-12-03 LAB — CBC WITH DIFFERENTIAL/PLATELET
Absolute Monocytes: 365 cells/uL (ref 200–950)
Basophils Absolute: 21 cells/uL (ref 0–200)
Basophils Relative: 0.5 %
Eosinophils Absolute: 193 cells/uL (ref 15–500)
Eosinophils Relative: 4.6 %
HCT: 40.1 % (ref 38.5–50.0)
Hemoglobin: 13 g/dL — ABNORMAL LOW (ref 13.2–17.1)
Lymphs Abs: 1793 cells/uL (ref 850–3900)
MCH: 28.6 pg (ref 27.0–33.0)
MCHC: 32.4 g/dL (ref 32.0–36.0)
MCV: 88.1 fL (ref 80.0–100.0)
MPV: 10.1 fL (ref 7.5–12.5)
Monocytes Relative: 8.7 %
Neutro Abs: 1827 cells/uL (ref 1500–7800)
Neutrophils Relative %: 43.5 %
Platelets: 268 10*3/uL (ref 140–400)
RBC: 4.55 10*6/uL (ref 4.20–5.80)
RDW: 16.9 % — ABNORMAL HIGH (ref 11.0–15.0)
Total Lymphocyte: 42.7 %
WBC: 4.2 10*3/uL (ref 3.8–10.8)

## 2022-12-03 LAB — RPR: RPR Ser Ql: NONREACTIVE

## 2022-12-03 LAB — T-HELPER CELL (CD4) - (RCID CLINIC ONLY)
CD4 % Helper T Cell: 35 % (ref 33–65)
CD4 T Cell Abs: 560 /uL (ref 400–1790)

## 2022-12-04 ENCOUNTER — Other Ambulatory Visit: Payer: Self-pay | Admitting: Internal Medicine

## 2022-12-05 DIAGNOSIS — Z992 Dependence on renal dialysis: Secondary | ICD-10-CM | POA: Diagnosis not present

## 2022-12-05 DIAGNOSIS — E079 Disorder of thyroid, unspecified: Secondary | ICD-10-CM | POA: Diagnosis not present

## 2022-12-05 DIAGNOSIS — D509 Iron deficiency anemia, unspecified: Secondary | ICD-10-CM | POA: Diagnosis not present

## 2022-12-05 DIAGNOSIS — D631 Anemia in chronic kidney disease: Secondary | ICD-10-CM | POA: Diagnosis not present

## 2022-12-05 DIAGNOSIS — E876 Hypokalemia: Secondary | ICD-10-CM | POA: Diagnosis not present

## 2022-12-05 DIAGNOSIS — N186 End stage renal disease: Secondary | ICD-10-CM | POA: Diagnosis not present

## 2022-12-05 DIAGNOSIS — N2581 Secondary hyperparathyroidism of renal origin: Secondary | ICD-10-CM | POA: Diagnosis not present

## 2022-12-08 DIAGNOSIS — D631 Anemia in chronic kidney disease: Secondary | ICD-10-CM | POA: Diagnosis not present

## 2022-12-08 DIAGNOSIS — E876 Hypokalemia: Secondary | ICD-10-CM | POA: Diagnosis not present

## 2022-12-08 DIAGNOSIS — D509 Iron deficiency anemia, unspecified: Secondary | ICD-10-CM | POA: Diagnosis not present

## 2022-12-08 DIAGNOSIS — E079 Disorder of thyroid, unspecified: Secondary | ICD-10-CM | POA: Diagnosis not present

## 2022-12-08 DIAGNOSIS — N186 End stage renal disease: Secondary | ICD-10-CM | POA: Diagnosis not present

## 2022-12-08 DIAGNOSIS — Z992 Dependence on renal dialysis: Secondary | ICD-10-CM | POA: Diagnosis not present

## 2022-12-08 DIAGNOSIS — N2581 Secondary hyperparathyroidism of renal origin: Secondary | ICD-10-CM | POA: Diagnosis not present

## 2022-12-10 DIAGNOSIS — E079 Disorder of thyroid, unspecified: Secondary | ICD-10-CM | POA: Diagnosis not present

## 2022-12-10 DIAGNOSIS — E876 Hypokalemia: Secondary | ICD-10-CM | POA: Diagnosis not present

## 2022-12-10 DIAGNOSIS — D509 Iron deficiency anemia, unspecified: Secondary | ICD-10-CM | POA: Diagnosis not present

## 2022-12-10 DIAGNOSIS — N186 End stage renal disease: Secondary | ICD-10-CM | POA: Diagnosis not present

## 2022-12-10 DIAGNOSIS — Z992 Dependence on renal dialysis: Secondary | ICD-10-CM | POA: Diagnosis not present

## 2022-12-10 DIAGNOSIS — N2581 Secondary hyperparathyroidism of renal origin: Secondary | ICD-10-CM | POA: Diagnosis not present

## 2022-12-10 DIAGNOSIS — D631 Anemia in chronic kidney disease: Secondary | ICD-10-CM | POA: Diagnosis not present

## 2022-12-11 ENCOUNTER — Encounter: Payer: 59 | Admitting: Internal Medicine

## 2022-12-11 DIAGNOSIS — Z01818 Encounter for other preprocedural examination: Secondary | ICD-10-CM | POA: Diagnosis not present

## 2022-12-11 DIAGNOSIS — C642 Malignant neoplasm of left kidney, except renal pelvis: Secondary | ICD-10-CM | POA: Diagnosis not present

## 2022-12-11 NOTE — Progress Notes (Deleted)
[facility]   CHIEF COMPLAINT    HIV follow up.  ***  SUBJECTIVE:    Albert Eaton is a 57 y.o. male with PMHx as below who presents to the clinic for HIV follow up.   Please see A&P for the details of today's visit and status of the patient's medical problems.   Patient's Medications  New Prescriptions   No medications on file  Previous Medications   AMIODARONE (PACERONE) 200 MG TABLET    Take 1 tablet (200 mg total) by mouth daily.   AMLODIPINE (NORVASC) 10 MG TABLET    Take 1 tablet (10 mg total) by mouth daily.   APIXABAN (ELIQUIS) 5 MG TABS TABLET    Take 1 tablet (5 mg total) by mouth 2 (two) times daily.   AZELASTINE (ASTELIN) 0.1 % NASAL SPRAY    Place 2 sprays into both nostrils 2 (two) times daily as needed for allergies.   B COMPLEX-C-ZN-FOLIC ACID (DIALYVITE [id number] 15 PO)    Take 1 tablet by mouth daily.   BIKTARVY 50-200-25 MG TABS TABLET    TAKE ONE TABLET BY MOUTH EVERY DAY   BUDESONIDE-FORMOTEROL (SYMBICORT) 160-4.5 MCG/ACT INHALER    Inhale 2 puffs into the lungs in the morning and at bedtime.   CALCITRIOL (ROCALTROL) 0.25 MCG CAPSULE    Take 7 capsules (1.75 mcg total) by mouth every Monday, Wednesday, and Friday with hemodialysis.   CETIRIZINE (ZYRTEC) 10 MG TABLET    Take 1 tablet (10 mg total) by mouth daily.   DICLOFENAC SODIUM (VOLTAREN) 1 % GEL    Apply 2 g topically 4 (four) times daily. Apply to chest pain when lidocaine patch not in place   DOXERCALCIFEROL (HECTOROL IV)    as directed. Dialysis   FAMOTIDINE (PEPCID) 20 MG TABLET    Take 1 tablet (20 mg total) by mouth 2 (two) times daily.   FERRIC CITRATE (AURYXIA) 1 GM 210 MG(FE) TABLET    Take 420 mg by mouth See admin instructions. Take 420 mg by mouth three times a day with meals and 210 mg with each snack   FLUTICASONE (FLOVENT HFA) 110 MCG/ACT INHALER    Inhale 2 puffs into the lungs 2 (two) times daily as needed ("for flares").   GABAPENTIN (NEURONTIN) 300 MG  CAPSULE    Take 1 capsule (300 mg total) by mouth daily as needed (pain/neuropathy).   IPRATROPIUM (ATROVENT HFA) 17 MCG/ACT INHALER    Inhale into the lungs as needed for wheezing.   IPRATROPIUM (ATROVENT) 0.06 % NASAL SPRAY    USE TWO SPRAYS IN EACH NOSTRIL two TO THREE times daily as needed   LEVALBUTEROL (XOPENEX HFA) 45 MCG/ACT INHALER    Inhale 2 puffs into the lungs every 4 (four) hours as needed for wheezing.   LEVOTHYROXINE (SYNTHROID) 25 MCG TABLET    Take 1 tablet (25 mcg total) by mouth daily before breakfast.   LUBIPROSTONE (AMITIZA) 24 MCG CAPSULE    Take 1 capsule (24 mcg total) by mouth 2 (two) times daily with a meal. NEEDS OFFICE VISIT FOR FURTHER REFILLS   METHOXY PEG-EPOETIN BETA (MIRCERA IJ)    Inject 1 each into the skin every 30 (thirty) days. Administered at dialysis center once a month   METOPROLOL TARTRATE (LOPRESSOR) 25 MG TABLET    Take 1 tablet (25 mg total) by mouth 2 (two) times daily.   OLOPATADINE HCL (PATADAY) 0.7 % SOLN    Place  1 drop into both eyes 2 (two) times daily as needed (allergies).   PANTOPRAZOLE (PROTONIX) 40 MG TABLET    Take 1 tablet (40 mg total) by mouth daily.   ROPINIROLE (REQUIP) 1 MG TABLET    TAKE ONE TABLET BY MOUTH AT BEDTIME   ROSUVASTATIN (CRESTOR) 10 MG TABLET    TAKE ONE TABLET BY MOUTH DAILY   SENSIPAR 60 MG TABLET    Take 60 mg by mouth See admin instructions. Take 60 mg by mouth with supper after dialysis treatments on Mon/Wed/Fri   SPACER/AERO-HOLDING CHAMBERS DEVI    1 Device by Does not apply route 2 (two) times a day.   VITAMIN D, ERGOCALCIFEROL, (DRISDOL) 1.25 MG (50000 UNIT) CAPS CAPSULE    Take 50,000 Units by mouth every Tuesday.  Modified Medications   No medications on file  Discontinued Medications   No medications on file      Past Medical History:  Diagnosis Date   Anemia    Aortic atherosclerosis (HCC)    Arthritis    Asthma    Cancer ([place])    renal cyst   Chronic diastolic CHF (congestive heart failure) ([place])  01/06/2017   Echo 7/16 [facility] in [place], [name]) Mild AI, mild LAE, mild concentric LVH, EF 55, normal wall motion, mild to moderate MR, mild PI, RVSP 55 // Echo 10/09/16 ([facility]):  Moderate LVH, grade 2 diastolic dysfunction, mild MR, moderate LAE    Esophagitis    ESRD (end stage renal disease) on dialysis [facility])    Dialysis Mon Wed Fri   GERD (gastroesophageal reflux disease)    Gout    no current problems   Hepatitis    Hep B   HIV (human immunodeficiency virus infection) (HCC)    HLD (hyperlipidemia)    Hypertension    Hypothyroidism    Mitral regurgitation    Neuropathy    Sleep apnea    uses c-pap   Wears glasses    Wears partial dentures     Social History   Tobacco Use   Smoking status: Former    Packs/day: 1.00    Years: 18.00    Total pack years: 18.00    Types: Cigarettes    Quit date: 2000    Years since quitting: 24.1   Smokeless tobacco: Never  Vaping Use   Vaping Use: Never used  Substance Use Topics   Alcohol use: Not Currently   Drug use: No    Family History  Problem Relation Age of Onset   Hypertension Mother    Heart failure Mother    Hypertension Father    Alcohol abuse Father    Hypertension Sister    Multiple sclerosis Sister    Hypertension Sister    Hypertension Brother    Heart attack Maternal Grandmother 53   Scoliosis Other    Allergic rhinitis Neg Hx    Angioedema Neg Hx    Asthma Neg Hx    Eczema Neg Hx    Immunodeficiency Neg Hx    Urticaria Neg Hx    Colon cancer Neg Hx    Pancreatic cancer Neg Hx    Esophageal cancer Neg Hx     Allergies  Allergen Reactions   Ace Inhibitors Cough and Other (See Comments)    ROS   OBJECTIVE:    There were no vitals filed for this visit.   There is no height or weight on file to calculate BMI.  Physical Exam  Labs and Microbiology:  Latest Ref Rng & Units 12/02/2022    9:29 AM 10/29/2022    6:53 PM 09/14/2022    8:15 AM  CMP  Glucose 65 - 99 mg/dL 118  90  95    BUN 7 - 25 mg/dL 26  12  66   Creatinine 0.70 - 1.30 mg/dL 8.29  4.57  11.49   Sodium 135 - 146 mmol/L 144  137  130   Potassium 3.5 - 5.3 mmol/L 3.4  3.3  4.4   Chloride 98 - 110 mmol/L 101  93  88   CO2 20 - 32 mmol/L 26  29  23   $ Calcium 8.6 - 10.3 mg/dL 9.0  8.6  8.8   Total Protein 6.1 - 8.1 g/dL 7.8  8.0    Total Bilirubin 0.2 - 1.2 mg/dL 0.4  0.5    Alkaline Phos 38 - 126 U/L  116    AST 10 - 35 U/L 19  15    ALT 9 - 46 U/L 26  12        Latest Ref Rng & Units 12/02/2022    9:29 AM 10/29/2022    6:53 PM 09/14/2022    8:15 AM  CBC  WBC 3.8 - 10.8 Thousand/uL 4.2  10.5  9.1   Hemoglobin 13.2 - 17.1 g/dL 13.0  9.9  8.0   Hematocrit 38.5 - 50.0 % 40.1  32.5  24.6   Platelets 140 - 400 Thousand/uL 268  392  367      Lab Results  Component Value Date   HIV1RNAQUANT Not Detected 04/17/2022   HIV1RNAQUANT 11,900 (H) 01/13/2022   HIV1RNAQUANT Not Detected 06/27/2021   HIV1RNAVL <20 11/13/2022   CD4TABS 560 12/02/2022   CD4TABS 694 09/08/2022   CD4TABS 694 04/17/2022    RPR and STI: Lab Results  Component Value Date   LABRPR NON-REACTIVE 12/02/2022   LABRPR NON-REACTIVE 04/17/2022   LABRPR NON-REACTIVE 06/27/2021   LABRPR NON-REACTIVE 06/27/2020   LABRPR NON-REACTIVE 10/06/2019   RPRTITER 1:1 (H) 02/17/2018    STI Results GC GC CT CT  06/27/2021  4:02 PM Negative   Negative    10/06/2019 12:00 AM Negative   Negative    11/22/2018 12:00 AM Negative   Negative    02/17/2018 12:00 AM Negative   Negative    05/25/2017 12:00 AM Negative   Negative    12/25/2016  2:17 PM  NOT DETECTED   NOT DETECTED   10/15/2016 12:00 AM Negative   Negative      Hepatitis B: Lab Results  Component Value Date   HEPBSAB NON REACTIVE 12/02/2021   HEPBSAG Reactive (A) 09/09/2022   Hepatitis C: No results found for: "HEPCAB", "HCVRNAPCRQN" Hepatitis A: Lab Results  Component Value Date   HAV REACTIVE (A) 10/15/2016   Lipids: Lab Results  Component Value Date   CHOL 135  12/02/2022   TRIG 108 12/02/2022   HDL 39 (L) 12/02/2022   CHOLHDL 3.5 12/02/2022   VLDL 64 (H) 09/04/2021   LDLCALC 77 12/02/2022    Imaging: ***   ASSESSMENT & PLAN:    No problem-specific Assessment & Plan notes found for this encounter.   No orders of the defined types were placed in this encounter.      *** Vaccines Influenza: give every year COVID: recommend vaccination if not already done Prevnar 20: Give x 1 if no prior pneumonia vaccine.    - If only 1 dose of either PPSV 23 OR PCV 13 ---->  give PCV 20 if > 1 year since last vaccine  - If received both PPSV 23 AND PCV 13 ----> give PCV 20 if > 5 years since last vaccine.  If < 5 years then wait to give PCV 20 Pneumovax-23: (if CD4 >200) give twice every 5 years apart before age 47, then once at age 28.  Give >8 weeks from [place] Prevnar-13: (preferably when CD4 >200) give once, give >1 year from last Pneumovax-23 Hepatitis A: give Havrix 2 dose series at 0 and 6-12 months if non-immune Hepatitis B: give Heplisav 2 dose series at 0 and 4 weeks if non-immune.  Repeat serology 2 months after vaccine and revaccinate if needed MenACWY: 2 dose primary series 8 weeks apart, then 1 dose booster every 5 years HPV: Gardasil-9 at 0, 2, and 6 months for ages 9-26 should be vaccinated.  Ages 69-45 should be offered if appropriate Tdap: give every 10 years Shingles: give Shingrix 2 dose series at 0 and 2-6 months if >50 years on ART with CD4 cell count >200 Varicella: primary vaccination may be considered in VZV seronegative persons aged >8 years (if CD4 >200)  MMR: vaccine should be given if born in 65 or after and do not have immunity (if CD4 >200)  Screening DEXA Scan: if age >70 Quantiferon: check at initiation of care Hepatitis C: check at initiation of care.  Screen annually if risk factors HLA B5701: check at initiation of care G6PD: check if starting therapy with oxidant drugs Lipids: check annually Urinalysis:  check annually or every 6 months if on tenofovir Hgb A1c: check annually  ASCVD Risk Score Consider high-intensity statin therapy if 10-year ASCVD risk score >7.5% The ASCVD Risk score (Arnett DK, et al., 2019) failed to calculate for the following reasons:   The patient has a prior MI or stroke diagnosis   [facility] 12/11/2022, 10:40 AM  HIV: Patient presenting today for routine HIV follow-up.  He is currently on Biktarvy and was previously followed by Dr. Johnnye Sima with his last visit having been in June 2023.  At that time, his viral load was undetectable.  He additionally came back and had labs done last month on November 13, 2022 at which time his viral load was also undetectable again.  His CD4 count has been healthy and was 560 earlier this month.  Will continue with daily Biktarvy and send refills today.  There is no need for repeat lab work at this time given he had this done within the last month.  End-stage renal disease: Patient currently on intermittent hemodialysis but is undergoing transplant evaluation at [facility].  Chronic hepatitis B: Patient with positive surface antigen in November 2023.  Currently treated with Biktarvy.  Anal Pap: Discussed obtaining anal cytology for cancer screening with the patient.  He ***.  Need for PCP PPx: ***  Vaccines: ***  STI/Screening: ***  Encounter for medication monitoring: ***

## 2022-12-13 DIAGNOSIS — Z992 Dependence on renal dialysis: Secondary | ICD-10-CM | POA: Diagnosis not present

## 2022-12-13 DIAGNOSIS — E079 Disorder of thyroid, unspecified: Secondary | ICD-10-CM | POA: Diagnosis not present

## 2022-12-13 DIAGNOSIS — D631 Anemia in chronic kidney disease: Secondary | ICD-10-CM | POA: Diagnosis not present

## 2022-12-13 DIAGNOSIS — D509 Iron deficiency anemia, unspecified: Secondary | ICD-10-CM | POA: Diagnosis not present

## 2022-12-13 DIAGNOSIS — E876 Hypokalemia: Secondary | ICD-10-CM | POA: Diagnosis not present

## 2022-12-13 DIAGNOSIS — N2581 Secondary hyperparathyroidism of renal origin: Secondary | ICD-10-CM | POA: Diagnosis not present

## 2022-12-13 DIAGNOSIS — N186 End stage renal disease: Secondary | ICD-10-CM | POA: Diagnosis not present

## 2022-12-15 DIAGNOSIS — D631 Anemia in chronic kidney disease: Secondary | ICD-10-CM | POA: Diagnosis not present

## 2022-12-15 DIAGNOSIS — E876 Hypokalemia: Secondary | ICD-10-CM | POA: Diagnosis not present

## 2022-12-15 DIAGNOSIS — N186 End stage renal disease: Secondary | ICD-10-CM | POA: Diagnosis not present

## 2022-12-15 DIAGNOSIS — Z992 Dependence on renal dialysis: Secondary | ICD-10-CM | POA: Diagnosis not present

## 2022-12-15 DIAGNOSIS — N2581 Secondary hyperparathyroidism of renal origin: Secondary | ICD-10-CM | POA: Diagnosis not present

## 2022-12-15 DIAGNOSIS — E079 Disorder of thyroid, unspecified: Secondary | ICD-10-CM | POA: Diagnosis not present

## 2022-12-15 DIAGNOSIS — D509 Iron deficiency anemia, unspecified: Secondary | ICD-10-CM | POA: Diagnosis not present

## 2022-12-17 DIAGNOSIS — E079 Disorder of thyroid, unspecified: Secondary | ICD-10-CM | POA: Diagnosis not present

## 2022-12-17 DIAGNOSIS — E876 Hypokalemia: Secondary | ICD-10-CM | POA: Diagnosis not present

## 2022-12-17 DIAGNOSIS — D509 Iron deficiency anemia, unspecified: Secondary | ICD-10-CM | POA: Diagnosis not present

## 2022-12-17 DIAGNOSIS — N2581 Secondary hyperparathyroidism of renal origin: Secondary | ICD-10-CM | POA: Diagnosis not present

## 2022-12-17 DIAGNOSIS — N186 End stage renal disease: Secondary | ICD-10-CM | POA: Diagnosis not present

## 2022-12-17 DIAGNOSIS — D631 Anemia in chronic kidney disease: Secondary | ICD-10-CM | POA: Diagnosis not present

## 2022-12-17 DIAGNOSIS — Z992 Dependence on renal dialysis: Secondary | ICD-10-CM | POA: Diagnosis not present

## 2022-12-19 DIAGNOSIS — D631 Anemia in chronic kidney disease: Secondary | ICD-10-CM | POA: Diagnosis not present

## 2022-12-19 DIAGNOSIS — D509 Iron deficiency anemia, unspecified: Secondary | ICD-10-CM | POA: Diagnosis not present

## 2022-12-19 DIAGNOSIS — N2581 Secondary hyperparathyroidism of renal origin: Secondary | ICD-10-CM | POA: Diagnosis not present

## 2022-12-19 DIAGNOSIS — Z992 Dependence on renal dialysis: Secondary | ICD-10-CM | POA: Diagnosis not present

## 2022-12-19 DIAGNOSIS — N186 End stage renal disease: Secondary | ICD-10-CM | POA: Diagnosis not present

## 2022-12-19 DIAGNOSIS — E079 Disorder of thyroid, unspecified: Secondary | ICD-10-CM | POA: Diagnosis not present

## 2022-12-19 DIAGNOSIS — E876 Hypokalemia: Secondary | ICD-10-CM | POA: Diagnosis not present

## 2022-12-20 ENCOUNTER — Other Ambulatory Visit: Payer: Self-pay | Admitting: Infectious Diseases

## 2022-12-20 DIAGNOSIS — B20 Human immunodeficiency virus [HIV] disease: Secondary | ICD-10-CM

## 2022-12-22 DIAGNOSIS — Z992 Dependence on renal dialysis: Secondary | ICD-10-CM | POA: Diagnosis not present

## 2022-12-22 DIAGNOSIS — N2581 Secondary hyperparathyroidism of renal origin: Secondary | ICD-10-CM | POA: Diagnosis not present

## 2022-12-22 DIAGNOSIS — D509 Iron deficiency anemia, unspecified: Secondary | ICD-10-CM | POA: Diagnosis not present

## 2022-12-22 DIAGNOSIS — E876 Hypokalemia: Secondary | ICD-10-CM | POA: Diagnosis not present

## 2022-12-22 DIAGNOSIS — D631 Anemia in chronic kidney disease: Secondary | ICD-10-CM | POA: Diagnosis not present

## 2022-12-22 DIAGNOSIS — N186 End stage renal disease: Secondary | ICD-10-CM | POA: Diagnosis not present

## 2022-12-22 DIAGNOSIS — E079 Disorder of thyroid, unspecified: Secondary | ICD-10-CM | POA: Diagnosis not present

## 2022-12-22 NOTE — Telephone Encounter (Signed)
RTC from patient will follow up with Dr. Juleen China.  Refill request for Biktarvy was routed to Dr. Juleen China.

## 2022-12-22 NOTE — Telephone Encounter (Signed)
Call to patient to see if he will be seeing Dr. Juleen China or Dr. Johnnye Sima for his medication.  Message was left to call the Clinics to inform us of his plans.

## 2022-12-22 NOTE — Telephone Encounter (Signed)
Looks like patient patient is scheduled to follow up with Dr. Juleen China next month. Can you clarify with patient if he will be following up with Dr. Johnnye Sima in our clinic or with [facility]?

## 2022-12-24 DIAGNOSIS — Z992 Dependence on renal dialysis: Secondary | ICD-10-CM | POA: Diagnosis not present

## 2022-12-24 DIAGNOSIS — N2581 Secondary hyperparathyroidism of renal origin: Secondary | ICD-10-CM | POA: Diagnosis not present

## 2022-12-24 DIAGNOSIS — D631 Anemia in chronic kidney disease: Secondary | ICD-10-CM | POA: Diagnosis not present

## 2022-12-24 DIAGNOSIS — E876 Hypokalemia: Secondary | ICD-10-CM | POA: Diagnosis not present

## 2022-12-24 DIAGNOSIS — N186 End stage renal disease: Secondary | ICD-10-CM | POA: Diagnosis not present

## 2022-12-24 DIAGNOSIS — E079 Disorder of thyroid, unspecified: Secondary | ICD-10-CM | POA: Diagnosis not present

## 2022-12-24 DIAGNOSIS — D509 Iron deficiency anemia, unspecified: Secondary | ICD-10-CM | POA: Diagnosis not present

## 2022-12-25 DIAGNOSIS — I129 Hypertensive chronic kidney disease with stage 1 through stage 4 chronic kidney disease, or unspecified chronic kidney disease: Secondary | ICD-10-CM | POA: Diagnosis not present

## 2022-12-25 DIAGNOSIS — Z992 Dependence on renal dialysis: Secondary | ICD-10-CM | POA: Diagnosis not present

## 2022-12-25 DIAGNOSIS — N186 End stage renal disease: Secondary | ICD-10-CM | POA: Diagnosis not present

## 2022-12-26 DIAGNOSIS — D509 Iron deficiency anemia, unspecified: Secondary | ICD-10-CM | POA: Diagnosis not present

## 2022-12-26 DIAGNOSIS — E079 Disorder of thyroid, unspecified: Secondary | ICD-10-CM | POA: Diagnosis not present

## 2022-12-26 DIAGNOSIS — Z992 Dependence on renal dialysis: Secondary | ICD-10-CM | POA: Diagnosis not present

## 2022-12-26 DIAGNOSIS — N186 End stage renal disease: Secondary | ICD-10-CM | POA: Diagnosis not present

## 2022-12-26 DIAGNOSIS — N2581 Secondary hyperparathyroidism of renal origin: Secondary | ICD-10-CM | POA: Diagnosis not present

## 2022-12-26 DIAGNOSIS — E876 Hypokalemia: Secondary | ICD-10-CM | POA: Diagnosis not present

## 2022-12-26 DIAGNOSIS — D631 Anemia in chronic kidney disease: Secondary | ICD-10-CM | POA: Diagnosis not present

## 2022-12-29 DIAGNOSIS — D631 Anemia in chronic kidney disease: Secondary | ICD-10-CM | POA: Diagnosis not present

## 2022-12-29 DIAGNOSIS — Z992 Dependence on renal dialysis: Secondary | ICD-10-CM | POA: Diagnosis not present

## 2022-12-29 DIAGNOSIS — E079 Disorder of thyroid, unspecified: Secondary | ICD-10-CM | POA: Diagnosis not present

## 2022-12-29 DIAGNOSIS — D509 Iron deficiency anemia, unspecified: Secondary | ICD-10-CM | POA: Diagnosis not present

## 2022-12-29 DIAGNOSIS — N186 End stage renal disease: Secondary | ICD-10-CM | POA: Diagnosis not present

## 2022-12-29 DIAGNOSIS — N2581 Secondary hyperparathyroidism of renal origin: Secondary | ICD-10-CM | POA: Diagnosis not present

## 2022-12-29 DIAGNOSIS — E876 Hypokalemia: Secondary | ICD-10-CM | POA: Diagnosis not present

## 2022-12-31 DIAGNOSIS — N2581 Secondary hyperparathyroidism of renal origin: Secondary | ICD-10-CM | POA: Diagnosis not present

## 2022-12-31 DIAGNOSIS — D509 Iron deficiency anemia, unspecified: Secondary | ICD-10-CM | POA: Diagnosis not present

## 2022-12-31 DIAGNOSIS — D631 Anemia in chronic kidney disease: Secondary | ICD-10-CM | POA: Diagnosis not present

## 2022-12-31 DIAGNOSIS — E876 Hypokalemia: Secondary | ICD-10-CM | POA: Diagnosis not present

## 2022-12-31 DIAGNOSIS — N186 End stage renal disease: Secondary | ICD-10-CM | POA: Diagnosis not present

## 2022-12-31 DIAGNOSIS — Z992 Dependence on renal dialysis: Secondary | ICD-10-CM | POA: Diagnosis not present

## 2022-12-31 DIAGNOSIS — E079 Disorder of thyroid, unspecified: Secondary | ICD-10-CM | POA: Diagnosis not present

## 2023-01-01 ENCOUNTER — Ambulatory Visit (INDEPENDENT_AMBULATORY_CARE_PROVIDER_SITE_OTHER): Payer: Medicare Other | Admitting: Nurse Practitioner

## 2023-01-01 ENCOUNTER — Encounter: Payer: Self-pay | Admitting: Nurse Practitioner

## 2023-01-01 VITALS — BP 136/82 | HR 65 | Temp 98.0°F | Resp 16 | Ht 69.5 in | Wt 195.5 lb

## 2023-01-01 DIAGNOSIS — K5000 Crohn's disease of small intestine without complications: Secondary | ICD-10-CM

## 2023-01-01 DIAGNOSIS — I480 Paroxysmal atrial fibrillation: Secondary | ICD-10-CM | POA: Diagnosis not present

## 2023-01-01 DIAGNOSIS — G4733 Obstructive sleep apnea (adult) (pediatric): Secondary | ICD-10-CM | POA: Diagnosis not present

## 2023-01-01 DIAGNOSIS — M545 Low back pain, unspecified: Secondary | ICD-10-CM | POA: Diagnosis not present

## 2023-01-01 DIAGNOSIS — Z992 Dependence on renal dialysis: Secondary | ICD-10-CM

## 2023-01-01 DIAGNOSIS — E039 Hypothyroidism, unspecified: Secondary | ICD-10-CM | POA: Diagnosis not present

## 2023-01-01 DIAGNOSIS — Z125 Encounter for screening for malignant neoplasm of prostate: Secondary | ICD-10-CM

## 2023-01-01 DIAGNOSIS — I770 Arteriovenous fistula, acquired: Secondary | ICD-10-CM

## 2023-01-01 DIAGNOSIS — I5032 Chronic diastolic (congestive) heart failure: Secondary | ICD-10-CM

## 2023-01-01 DIAGNOSIS — I10 Essential (primary) hypertension: Secondary | ICD-10-CM | POA: Diagnosis not present

## 2023-01-01 DIAGNOSIS — G63 Polyneuropathy in diseases classified elsewhere: Secondary | ICD-10-CM

## 2023-01-01 DIAGNOSIS — Z0001 Encounter for general adult medical examination with abnormal findings: Secondary | ICD-10-CM

## 2023-01-01 DIAGNOSIS — N186 End stage renal disease: Secondary | ICD-10-CM

## 2023-01-01 DIAGNOSIS — B20 Human immunodeficiency virus [HIV] disease: Secondary | ICD-10-CM

## 2023-01-01 DIAGNOSIS — E559 Vitamin D deficiency, unspecified: Secondary | ICD-10-CM | POA: Diagnosis not present

## 2023-01-01 LAB — VITAMIN D 25 HYDROXY (VIT D DEFICIENCY, FRACTURES): VITD: 29.41 ng/mL — ABNORMAL LOW (ref 30.00–100.00)

## 2023-01-01 LAB — PSA, MEDICARE: PSA: 0.94 ng/ml (ref 0.10–4.00)

## 2023-01-01 MED ORDER — PREDNISONE 20 MG PO TABS: ORAL_TABLET | ORAL | 0 refills | Status: AC

## 2023-01-01 NOTE — Assessment & Plan Note (Signed)
Patient is followed by cardiology.  Continue following with cardiology as recommended he is on metoprolol 25 mg

## 2023-01-01 NOTE — Assessment & Plan Note (Signed)
Discussed age-appropriate immunizations and screening exams.  Patient is up-to-date on all vaccinations.  He is up-to-date on CRC screening.  Will update PSA screening today.  Patient was given information at discharge about preventative healthcare maintenance with anticipatory guidance.  Did inform patient before starting any exercise routine or regimen to be cleared through his cardiologist.

## 2023-01-01 NOTE — Progress Notes (Signed)
Established Patient Office Visit  Subjective   Patient ID: Albert Eaton, male    DOB: [birth year]  Age: 57 y.o. MRN: [id number]  Chief Complaint  Patient presents with   Annual Exam    HPI  ID: Patient can be followed by Jule Ser, DO for HIV currently on Biktarvy  Asthma allegery: Patient currently followed by Salvatore Marvel, MD.  Patient currently on Symbicort, Flovent, cetirizine.  Pulmonology: Manages patient's sleep apnea.  He has tried a CPAP but cannot tolerate the masks.  They are in talks and discussions about doing the implantable device inspire  Nephrology: Patient currently followed by Madelon Lips, MD patient is on dialysis on Monday Wednesday Friday schedule.  for complete physical and follow up of chronic conditions.  HTN: check blood pressure a couple times a week.  Patient currently on amlodipine 10 mg, metoprolol 25 mg.  CHF: Patient is followed by cardiology [name], MD managed amiodarone, metoprolol, apixaban.  OSA: states that he is followed by pulmonary and does not tolerate CPAP machine and discussions about doing inspire  Crohns: Zenovia Jarred, MD for Crohn's management   hypothyroidism Immunizations: -Tetanus: Completed in 2018 -Influenza: Completed this season -Shingles: Completed Shingrix series -Pneumonia: Completed   Diet: Fair diet. States that he is eating 1.5 meals a day. States infrequently snacking. Drinks water and sodas Exercise: No regular exercise.  Will get patient cleared from cardiology prior to initiating any type of exercise plan  Eye exam: Completes annually. States that he is due. Wears glasses onley   Dental exam: Completes semi-annually    Colonoscopy: Completed in 05/02/2021, repeat 5 years 2027 Lung Cancer Screening: N/A  PSA: Due  Sleep: State that he goes to bed around 9pm. For dialysis he gets up at 5am and on his off days he will sleep until he wakes up     Back pain : states that it has been there  for a while but the last rfew weeks it has been worse. States that it is not radiation. Movement makes it worse. States sharp intermittent pain. Rest does help it.  More left sided   Review of Systems  Constitutional:  Negative for chills and fever.  Respiratory:  Negative for shortness of breath.   Cardiovascular:  Positive for chest pain (msk). Negative for leg swelling.  Gastrointestinal:  Negative for abdominal pain, blood in stool, constipation, diarrhea, nausea and vomiting.       BM daily   Genitourinary:  Negative for dysuria and hematuria.       Anuria   Neurological:  Positive for tingling (chest, abdomen, bakc of legs and feet). Negative for headaches.  Psychiatric/Behavioral:  Negative for hallucinations and suicidal ideas.       Objective:     BP 136/82   Pulse 65   Temp 98 F (36.7 C)   Resp 16   Ht 5' 9.5" (1.765 m)   Wt 195 lb 8 oz (88.7 kg)   SpO2 98%   BMI 28.46 kg/m    Physical Exam Vitals and nursing note reviewed.  Constitutional:      Appearance: Normal appearance.  HENT:     Right Ear: Tympanic membrane, ear canal and external ear normal.     Left Ear: Tympanic membrane, ear canal and external ear normal.     Mouth/Throat:     Mouth: Mucous membranes are moist.     Pharynx: Oropharynx is clear.  Eyes:     Extraocular Movements: Extraocular movements intact.  Pupils: Pupils are equal, round, and reactive to light.  Cardiovascular:     Rate and Rhythm: Normal rate and regular rhythm.     Pulses: Normal pulses.     Heart sounds: Normal heart sounds.  Pulmonary:     Effort: Pulmonary effort is normal.     Breath sounds: Normal breath sounds.  Abdominal:     General: Bowel sounds are normal. There is no distension.     Palpations: There is no mass.     Tenderness: There is no abdominal tenderness.     Hernia: No hernia is present.  Musculoskeletal:        General: Tenderness present.     Lumbar back: Tenderness present. No bony  tenderness. Positive right straight leg raise test and positive left straight leg raise test.       Back:     Right lower leg: No edema.     Left lower leg: No edema.  Lymphadenopathy:     Cervical: No cervical adenopathy.  Skin:    General: Skin is warm.  Neurological:     General: No focal deficit present.     Mental Status: He is alert.     Deep Tendon Reflexes:     Reflex Scores:      Bicep reflexes are 2+ on the right side and 2+ on the left side.      Patellar reflexes are 2+ on the right side and 2+ on the left side.    Comments: Bilateral upper and lower extremity strength 5/5  Psychiatric:        Mood and Affect: Mood normal.        Behavior: Behavior normal.        Thought Content: Thought content normal.        Judgment: Judgment normal.      No results found for any visits on 01/01/23.    The ASCVD Risk score (Arnett DK, et al., 2019) failed to calculate for the following reasons:   The patient has a prior MI or stroke diagnosis    Assessment & Plan:   Problem List Items Addressed This Visit       Cardiovascular and Mediastinum   Chronic diastolic CHF (congestive heart failure) [facility])    Patient is followed by cardiology.  Continue following with cardiology as recommended he is on metoprolol 25 mg       Arteriovenous fistula, acquired [facility])    Patient is followed by Madelon Lips, MD nephrologist with Monday Wednesday Friday dialysis schedule      Essential hypertension    Patient is followed by cardiology currently on amlodipine 10 mg, metoprolol 25 mg.  Patient to continue medication as prescribed follow-up with cardiology as recommended blood pressure within normal limits here today.  Patient does check blood pressure at home      PAF (paroxysmal atrial fibrillation) [facility])    Patient currently followed by [name], MD cardiology.  Patient on metoprolol 25 mg, apixaban 5 mg, amiodarone 200 mg.  Continue taking medication as prescribed  follow-up with cardiology as recommended        Respiratory   OSA (obstructive sleep apnea)    Patient is followed by pulmonology.  He cannot tolerate a CPAP mask.  He states there are discussions about an inspire device        Digestive   Crohn's disease of small intestine without complications [facility])    Patient is followed by Zenovia Jarred, MD for gastroenterology.  Endocrine   Hypothyroidism   Hypothyroidism (acquired)    Patient is currently maintained on levothyroxine 25 mcg.  Tolerating medication well.  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  Continue        Genitourinary   ESRD (end stage renal disease) on dialysis [facility])    Patient followed by Madelon Lips, MD nephrologist dialysis Monday Wednesday Friday continue        Other   HIV disease [facility]) (Chronic)    Patient is followed by ID and currently on Biktarvy.  Continue taking medication as prescribed follow-up with ID clinic as recommended      Encounter for general adult medical examination with abnormal findings - Primary    Discussed age-appropriate immunizations and screening exams.  Patient is up-to-date on all vaccinations.  He is up-to-date on CRC screening.  Will update PSA screening today.  Patient was given information at discharge about preventative healthcare maintenance with anticipatory guidance.  Did inform patient before starting any exercise routine or regimen to be cleared through his cardiologist.      Left-sided low back pain without sciatica    Straight leg raise positive.  Will treat patient with prednisone taper.  Follow-up if no improvement no red flag symptoms in office      Relevant Medications   predniSONE (DELTASONE) 20 MG tablet   Vitamin D deficiency    History of vitamin D deficiency pending vitamin D today      Relevant Orders   VITAMIN D 25 Hydroxy  (Vit-D Deficiency, Fractures)   Other Visit Diagnoses     Screening for malignant neoplasm of prostate       Relevant Orders   PSA, Medicare       Return in about 6 months (around 07/04/2023) for Recheck chronic condiditons .    Romilda Garret, NP

## 2023-01-01 NOTE — Assessment & Plan Note (Signed)
Patient is currently maintained on levothyroxine 25 mcg.  Tolerating medication well.  Continue medication as prescribed

## 2023-01-01 NOTE — Assessment & Plan Note (Signed)
Patient is followed by Zenovia Jarred, MD for gastroenterology.

## 2023-01-01 NOTE — Assessment & Plan Note (Signed)
Straight leg raise positive.  Will treat patient with prednisone taper.  Follow-up if no improvement no red flag symptoms in office

## 2023-01-01 NOTE — Assessment & Plan Note (Signed)
Patient currently followed by [name], MD cardiology.  Patient on metoprolol 25 mg, apixaban 5 mg, amiodarone 200 mg.  Continue taking medication as prescribed follow-up with cardiology as recommended

## 2023-01-01 NOTE — Assessment & Plan Note (Signed)
Patient is followed by Madelon Lips, MD nephrologist with Monday Wednesday Friday dialysis schedule

## 2023-01-01 NOTE — Assessment & Plan Note (Signed)
Patient is followed by pulmonology.  He cannot tolerate a CPAP mask.  He states there are discussions about an inspire device

## 2023-01-01 NOTE — Assessment & Plan Note (Signed)
Patient currently maintained on gabapentin followed by ID.  Continue

## 2023-01-01 NOTE — Patient Instructions (Signed)
Nice to see you today I will be in touch with the labs once I have them Follow up with me in 6 months, sooner if you need me Let me know if the back does not improve

## 2023-01-01 NOTE — Assessment & Plan Note (Signed)
Patient is followed by ID and currently on Biktarvy.  Continue taking medication as prescribed follow-up with ID clinic as recommended

## 2023-01-01 NOTE — Assessment & Plan Note (Signed)
Patient followed by Madelon Lips, MD nephrologist dialysis Monday Wednesday Friday continue

## 2023-01-01 NOTE — Assessment & Plan Note (Signed)
History of vitamin D deficiency pending vitamin D today

## 2023-01-01 NOTE — Assessment & Plan Note (Signed)
Patient is followed by cardiology currently on amlodipine 10 mg, metoprolol 25 mg.  Patient to continue medication as prescribed follow-up with cardiology as recommended blood pressure within normal limits here today.  Patient does check blood pressure at home

## 2023-01-02 DIAGNOSIS — E876 Hypokalemia: Secondary | ICD-10-CM | POA: Diagnosis not present

## 2023-01-02 DIAGNOSIS — Z992 Dependence on renal dialysis: Secondary | ICD-10-CM | POA: Diagnosis not present

## 2023-01-02 DIAGNOSIS — D631 Anemia in chronic kidney disease: Secondary | ICD-10-CM | POA: Diagnosis not present

## 2023-01-02 DIAGNOSIS — D509 Iron deficiency anemia, unspecified: Secondary | ICD-10-CM | POA: Diagnosis not present

## 2023-01-02 DIAGNOSIS — E079 Disorder of thyroid, unspecified: Secondary | ICD-10-CM | POA: Diagnosis not present

## 2023-01-02 DIAGNOSIS — N186 End stage renal disease: Secondary | ICD-10-CM | POA: Diagnosis not present

## 2023-01-02 DIAGNOSIS — N2581 Secondary hyperparathyroidism of renal origin: Secondary | ICD-10-CM | POA: Diagnosis not present

## 2023-01-04 ENCOUNTER — Encounter: Payer: Self-pay | Admitting: Cardiovascular Disease

## 2023-01-04 ENCOUNTER — Encounter: Payer: Self-pay | Admitting: Nurse Practitioner

## 2023-01-04 DIAGNOSIS — E559 Vitamin D deficiency, unspecified: Secondary | ICD-10-CM

## 2023-01-05 DIAGNOSIS — N186 End stage renal disease: Secondary | ICD-10-CM | POA: Diagnosis not present

## 2023-01-05 DIAGNOSIS — D631 Anemia in chronic kidney disease: Secondary | ICD-10-CM | POA: Diagnosis not present

## 2023-01-05 DIAGNOSIS — E876 Hypokalemia: Secondary | ICD-10-CM | POA: Diagnosis not present

## 2023-01-05 DIAGNOSIS — N2581 Secondary hyperparathyroidism of renal origin: Secondary | ICD-10-CM | POA: Diagnosis not present

## 2023-01-05 DIAGNOSIS — D509 Iron deficiency anemia, unspecified: Secondary | ICD-10-CM | POA: Diagnosis not present

## 2023-01-05 DIAGNOSIS — E079 Disorder of thyroid, unspecified: Secondary | ICD-10-CM | POA: Diagnosis not present

## 2023-01-05 DIAGNOSIS — Z992 Dependence on renal dialysis: Secondary | ICD-10-CM | POA: Diagnosis not present

## 2023-01-05 MED ORDER — VITAMIN D (ERGOCALCIFEROL) 1.25 MG (50000 UNIT) PO CAPS: 50000.0000 [IU] | ORAL_CAPSULE | ORAL | 0 refills | Status: DC

## 2023-01-05 MED ORDER — METOPROLOL TARTRATE 25 MG PO TABS: 25.0000 mg | ORAL_TABLET | Freq: Two times a day (BID) | ORAL | 1 refills | Status: DC

## 2023-01-05 MED ORDER — AMIODARONE HCL 200 MG PO TABS: 200.0000 mg | ORAL_TABLET | Freq: Every day | ORAL | 3 refills | Status: DC

## 2023-01-06 ENCOUNTER — Other Ambulatory Visit (HOSPITAL_COMMUNITY)
Admission: RE | Admit: 2023-01-06 | Discharge: 2023-01-06 | Disposition: A | Discharge status: Home / Self Care | Payer: Medicare Other | Source: Ambulatory Visit | Attending: Internal Medicine | Admitting: Internal Medicine

## 2023-01-06 ENCOUNTER — Other Ambulatory Visit: Payer: Self-pay

## 2023-01-06 ENCOUNTER — Ambulatory Visit (INDEPENDENT_AMBULATORY_CARE_PROVIDER_SITE_OTHER): Payer: Medicare Other | Admitting: Internal Medicine

## 2023-01-06 ENCOUNTER — Encounter: Payer: Self-pay | Admitting: Internal Medicine

## 2023-01-06 VITALS — BP 134/92 | HR 73 | Temp 98.3°F | Wt 201.0 lb

## 2023-01-06 DIAGNOSIS — B181 Chronic viral hepatitis B without delta-agent: Secondary | ICD-10-CM | POA: Insufficient documentation

## 2023-01-06 DIAGNOSIS — Z129 Encounter for screening for malignant neoplasm, site unspecified: Secondary | ICD-10-CM | POA: Diagnosis not present

## 2023-01-06 DIAGNOSIS — N186 End stage renal disease: Secondary | ICD-10-CM

## 2023-01-06 DIAGNOSIS — B20 Human immunodeficiency virus [HIV] disease: Secondary | ICD-10-CM | POA: Diagnosis present

## 2023-01-06 DIAGNOSIS — Z992 Dependence on renal dialysis: Secondary | ICD-10-CM | POA: Diagnosis not present

## 2023-01-06 NOTE — Assessment & Plan Note (Signed)
Patient currently on intermittent hemodialysis but is undergoing transplant evaluation at [place].

## 2023-01-06 NOTE — Assessment & Plan Note (Signed)
Discussed obtaining anal cytology for cancer screening with the patient.  He is agreeable.

## 2023-01-06 NOTE — Assessment & Plan Note (Signed)
Patient with positive surface antigen in November 2023.  Currently treated with Biktarvy.

## 2023-01-06 NOTE — Assessment & Plan Note (Signed)
Patient presenting today for routine HIV follow-up.  He is currently on Biktarvy and was previously followed by Dr. Johnnye Sima with his last visit having been in June 2023.  At that time, his viral load was undetectable.  He had labs done on November 13, 2022 at which time his viral load was also undetectable again.  His CD4 count has been healthy and was 560 in January.  Will continue with daily Biktarvy.  There is no need for repeat lab work at this time given he had this done within the last month.

## 2023-01-06 NOTE — Progress Notes (Signed)
[facility]   CHIEF COMPLAINT    HIV follow up.    SUBJECTIVE:    Albert Eaton is a 57 y.o. male with PMHx as below who presents to the clinic for HIV follow up.   Please see A&P for the details of today's visit and status of the patient's medical problems.   Patient's Medications  New Prescriptions   No medications on file  Previous Medications   AMIODARONE (PACERONE) 200 MG TABLET    Take 1 tablet (200 mg total) by mouth daily.   AMLODIPINE (NORVASC) 10 MG TABLET    Take 1 tablet (10 mg total) by mouth daily.   APIXABAN (ELIQUIS) 5 MG TABS TABLET    Take 1 tablet (5 mg total) by mouth 2 (two) times daily.   AZELASTINE (ASTELIN) 0.1 % NASAL SPRAY    Place 2 sprays into both nostrils 2 (two) times daily as needed for allergies.   B COMPLEX-C-ZN-FOLIC ACID (DIALYVITE [id number] 15 PO)    Take 1 tablet by mouth daily.   BIKTARVY 50-200-25 MG TABS TABLET    TAKE ONE TABLET BY MOUTH EVERY DAY   BUDESONIDE-FORMOTEROL (SYMBICORT) 160-4.5 MCG/ACT INHALER    Inhale 2 puffs into the lungs in the morning and at bedtime.   CALCITRIOL (ROCALTROL) 0.25 MCG CAPSULE    Take 7 capsules (1.75 mcg total) by mouth every Monday, Wednesday, and Friday with hemodialysis.   CETIRIZINE (ZYRTEC) 10 MG TABLET    Take 1 tablet (10 mg total) by mouth daily.   DICLOFENAC SODIUM (VOLTAREN) 1 % GEL    Apply 2 g topically 4 (four) times daily. Apply to chest pain when lidocaine patch not in place   DOXERCALCIFEROL (HECTOROL IV)    as directed. Dialysis   FAMOTIDINE (PEPCID) 20 MG TABLET    Take 1 tablet (20 mg total) by mouth 2 (two) times daily.   FERRIC CITRATE (AURYXIA) 1 GM 210 MG(FE) TABLET    Take 420 mg by mouth See admin instructions. Take 420 mg by mouth three times a day with meals and 210 mg with each snack   FLUTICASONE (FLOVENT HFA) 110 MCG/ACT INHALER    Inhale 2 puffs into the lungs 2 (two) times daily as needed ("for flares").   GABAPENTIN (NEURONTIN) 300 MG  CAPSULE    Take 1 capsule (300 mg total) by mouth daily as needed (pain/neuropathy).   IPRATROPIUM (ATROVENT HFA) 17 MCG/ACT INHALER    Inhale into the lungs as needed for wheezing.   IPRATROPIUM (ATROVENT) 0.06 % NASAL SPRAY    USE TWO SPRAYS IN EACH NOSTRIL two TO THREE times daily as needed   LEVALBUTEROL (XOPENEX HFA) 45 MCG/ACT INHALER    Inhale 2 puffs into the lungs every 4 (four) hours as needed for wheezing.   LEVOTHYROXINE (SYNTHROID) 25 MCG TABLET    Take 1 tablet (25 mcg total) by mouth daily before breakfast.   LUBIPROSTONE (AMITIZA) 24 MCG CAPSULE    Take 1 capsule (24 mcg total) by mouth 2 (two) times daily with a meal. NEEDS OFFICE VISIT FOR FURTHER REFILLS   METHOXY PEG-EPOETIN BETA (MIRCERA IJ)    Inject 1 each into the skin every 30 (thirty) days. Administered at dialysis center once a month   METOPROLOL TARTRATE (LOPRESSOR) 25 MG TABLET    Take 1 tablet (25 mg total) by mouth 2 (two) times daily.   OLOPATADINE HCL (PATADAY) 0.7 % SOLN    Place  1 drop into both eyes 2 (two) times daily as needed (allergies).   PANTOPRAZOLE (PROTONIX) 40 MG TABLET    Take 1 tablet (40 mg total) by mouth daily.   PREDNISONE (DELTASONE) 20 MG TABLET    Take 1 tablet (20 mg total) by mouth 2 (two) times daily with a meal for 3 days, THEN 1 tablet (20 mg total) daily with breakfast for 3 days.   ROPINIROLE (REQUIP) 1 MG TABLET    TAKE ONE TABLET BY MOUTH AT BEDTIME   ROSUVASTATIN (CRESTOR) 10 MG TABLET    TAKE ONE TABLET BY MOUTH DAILY   SENSIPAR 60 MG TABLET    Take 60 mg by mouth See admin instructions. Take 60 mg by mouth with supper after dialysis treatments on Mon/Wed/Fri   SPACER/AERO-HOLDING CHAMBERS DEVI    1 Device by Does not apply route 2 (two) times a day.   VITAMIN D, ERGOCALCIFEROL, (DRISDOL) 1.25 MG (50000 UNIT) CAPS CAPSULE    Take 1 capsule (50,000 Units total) by mouth every Tuesday.  Modified Medications   No medications on file  Discontinued Medications   No medications on file       Past Medical History:  Diagnosis Date   Anemia    Aortic atherosclerosis (HCC)    Arthritis    Asthma    Cancer ([place])    renal cyst   Chronic diastolic CHF (congestive heart failure) ([place]) 01/06/2017   Echo 7/16 [facility] in [place], [name]) Mild AI, mild LAE, mild concentric LVH, EF 55, normal wall motion, mild to moderate MR, mild PI, RVSP 55 // Echo 10/09/16 ([facility]):  Moderate LVH, grade 2 diastolic dysfunction, mild MR, moderate LAE    Esophagitis    ESRD (end stage renal disease) on dialysis [facility])    Dialysis Mon Wed Fri   GERD (gastroesophageal reflux disease)    Gout    no current problems   Hepatitis    Hep B   HIV (human immunodeficiency virus infection) (HCC)    HLD (hyperlipidemia)    Hypertension    Hypothyroidism    Mitral regurgitation    Neuropathy    Sleep apnea    uses c-pap   Wears glasses    Wears partial dentures     Social History   Tobacco Use   Smoking status: Former    Packs/day: 1.00    Years: 18.00    Total pack years: 18.00    Types: Cigarettes    Quit date: 2000    Years since quitting: 24.2   Smokeless tobacco: Never  Vaping Use   Vaping Use: Never used  Substance Use Topics   Alcohol use: Not Currently   Drug use: No    Family History  Problem Relation Age of Onset   Hypertension Mother    Heart failure Mother    Hypertension Father    Alcohol abuse Father    Hypertension Sister    Multiple sclerosis Sister    Hypertension Sister    Hypertension Brother    Heart attack Maternal Grandmother 53   Scoliosis Other    Allergic rhinitis Neg Hx    Angioedema Neg Hx    Asthma Neg Hx    Eczema Neg Hx    Immunodeficiency Neg Hx    Urticaria Neg Hx    Colon cancer Neg Hx    Pancreatic cancer Neg Hx    Esophageal cancer Neg Hx     Allergies  Allergen Reactions   Ace Inhibitors Cough  and Other (See Comments)    Review of Systems  Constitutional: Negative.   Respiratory: Negative.    Cardiovascular: Negative.    Gastrointestinal: Negative.      OBJECTIVE:    Vitals:   01/06/23 1350 01/06/23 1423  BP: (!) 146/93 (!) 134/92  Pulse: 73   Temp: 98.3 F (36.8 C)   TempSrc: Oral   Weight: 201 lb (91.2 kg)      Body mass index is 29.26 kg/m.  Physical Exam Constitutional:      Appearance: Normal appearance.  HENT:     Head: Normocephalic and atraumatic.  Eyes:     Extraocular Movements: Extraocular movements intact.     Conjunctiva/sclera: Conjunctivae normal.  Pulmonary:     Effort: Pulmonary effort is normal. No respiratory distress.  Skin:    General: Skin is warm and dry.  Neurological:     General: No focal deficit present.     Mental Status: He is alert and oriented to person, place, and time.  Psychiatric:        Mood and Affect: Mood normal.        Behavior: Behavior normal.     Labs and Microbiology:    Latest Ref Rng & Units 12/02/2022    9:29 AM 10/29/2022    6:53 PM 09/14/2022    8:15 AM  CMP  Glucose 65 - 99 mg/dL 118  90  95   BUN 7 - 25 mg/dL 26  12  66   Creatinine 0.70 - 1.30 mg/dL 8.29  4.57  11.49   Sodium 135 - 146 mmol/L 144  137  130   Potassium 3.5 - 5.3 mmol/L 3.4  3.3  4.4   Chloride 98 - 110 mmol/L 101  93  88   CO2 20 - 32 mmol/L '26  29  23   '$ Calcium 8.6 - 10.3 mg/dL 9.0  8.6  8.8   Total Protein 6.1 - 8.1 g/dL 7.8  8.0    Total Bilirubin 0.2 - 1.2 mg/dL 0.4  0.5    Alkaline Phos 38 - 126 U/L  116    AST 10 - 35 U/L 19  15    ALT 9 - 46 U/L 26  12        Latest Ref Rng & Units 12/02/2022    9:29 AM 10/29/2022    6:53 PM 09/14/2022    8:15 AM  CBC  WBC 3.8 - 10.8 Thousand/uL 4.2  10.5  9.1   Hemoglobin 13.2 - 17.1 g/dL 13.0  9.9  8.0   Hematocrit 38.5 - 50.0 % 40.1  32.5  24.6   Platelets 140 - 400 Thousand/uL 268  392  367      Lab Results  Component Value Date   HIV1RNAQUANT Not Detected 04/17/2022   HIV1RNAQUANT 11,900 (H) 01/13/2022   HIV1RNAQUANT Not Detected 06/27/2021   HIV1RNAVL <20 11/13/2022   CD4TABS 560 12/02/2022    CD4TABS 694 09/08/2022   CD4TABS 694 04/17/2022    RPR and STI: Lab Results  Component Value Date   LABRPR NON-REACTIVE 12/02/2022   LABRPR NON-REACTIVE 04/17/2022   LABRPR NON-REACTIVE 06/27/2021   LABRPR NON-REACTIVE 06/27/2020   LABRPR NON-REACTIVE 10/06/2019   RPRTITER 1:1 (H) 02/17/2018    STI Results GC GC CT CT  06/27/2021  4:02 PM Negative   Negative    10/06/2019 12:00 AM Negative   Negative    11/22/2018 12:00 AM Negative   Negative    02/17/2018 12:00 AM Negative  Negative    05/25/2017 12:00 AM Negative   Negative    12/25/2016  2:17 PM  NOT DETECTED   NOT DETECTED   10/15/2016 12:00 AM Negative   Negative      Hepatitis B: Lab Results  Component Value Date   HEPBSAB NON REACTIVE 12/02/2021   HEPBSAG Reactive (A) 09/09/2022   Hepatitis C: No results found for: "HEPCAB", "HCVRNAPCRQN" Hepatitis A: Lab Results  Component Value Date   HAV REACTIVE (A) 10/15/2016   Lipids: Lab Results  Component Value Date   CHOL 135 12/02/2022   TRIG 108 12/02/2022   HDL 39 (L) 12/02/2022   CHOLHDL 3.5 12/02/2022   VLDL 64 (H) 09/04/2021   LDLCALC 77 12/02/2022    Imaging:    ASSESSMENT & PLAN:    HIV disease [facility]) Patient presenting today for routine HIV follow-up.  He is currently on Biktarvy and was previously followed by Dr. Johnnye Sima with his last visit having been in June 2023.  At that time, his viral load was undetectable.  He had labs done on November 13, 2022 at which time his viral load was also undetectable again.  His CD4 count has been healthy and was 560 in January.  Will continue with daily Biktarvy.  There is no need for repeat lab work at this time given he had this done within the last month.  ESRD (end stage renal disease) on dialysis [facility]) Patient currently on intermittent hemodialysis but is undergoing transplant evaluation at [place].  Chronic viral hepatitis B without delta-agent ([place]) Patient with positive surface antigen in November  2023.  Currently treated with Biktarvy.  Cancer screening Discussed obtaining anal cytology for cancer screening with the patient.  He is agreeable.   No orders of the defined types were placed in this encounter.    Raynelle [facility] 01/06/2023, 2:23 PM

## 2023-01-07 DIAGNOSIS — M9905 Segmental and somatic dysfunction of pelvic region: Secondary | ICD-10-CM | POA: Diagnosis not present

## 2023-01-07 DIAGNOSIS — E876 Hypokalemia: Secondary | ICD-10-CM | POA: Diagnosis not present

## 2023-01-07 DIAGNOSIS — Z7682 Awaiting organ transplant status: Secondary | ICD-10-CM | POA: Diagnosis not present

## 2023-01-07 DIAGNOSIS — Z992 Dependence on renal dialysis: Secondary | ICD-10-CM | POA: Diagnosis not present

## 2023-01-07 DIAGNOSIS — E079 Disorder of thyroid, unspecified: Secondary | ICD-10-CM | POA: Diagnosis not present

## 2023-01-07 DIAGNOSIS — N2581 Secondary hyperparathyroidism of renal origin: Secondary | ICD-10-CM | POA: Diagnosis not present

## 2023-01-07 DIAGNOSIS — D631 Anemia in chronic kidney disease: Secondary | ICD-10-CM | POA: Diagnosis not present

## 2023-01-07 DIAGNOSIS — M9903 Segmental and somatic dysfunction of lumbar region: Secondary | ICD-10-CM | POA: Diagnosis not present

## 2023-01-07 DIAGNOSIS — M9904 Segmental and somatic dysfunction of sacral region: Secondary | ICD-10-CM | POA: Diagnosis not present

## 2023-01-07 DIAGNOSIS — M5386 Other specified dorsopathies, lumbar region: Secondary | ICD-10-CM | POA: Diagnosis not present

## 2023-01-07 DIAGNOSIS — N186 End stage renal disease: Secondary | ICD-10-CM | POA: Diagnosis not present

## 2023-01-07 DIAGNOSIS — D509 Iron deficiency anemia, unspecified: Secondary | ICD-10-CM | POA: Diagnosis not present

## 2023-01-08 ENCOUNTER — Encounter: Payer: Self-pay | Admitting: Internal Medicine

## 2023-01-08 ENCOUNTER — Ambulatory Visit: Payer: Medicare Other | Admitting: Internal Medicine

## 2023-01-08 VITALS — BP 138/80 | HR 50 | Ht 70.0 in | Wt 198.0 lb

## 2023-01-08 DIAGNOSIS — K5909 Other constipation: Secondary | ICD-10-CM | POA: Diagnosis not present

## 2023-01-08 DIAGNOSIS — Z86010 Personal history of colonic polyps: Secondary | ICD-10-CM

## 2023-01-08 DIAGNOSIS — K219 Gastro-esophageal reflux disease without esophagitis: Secondary | ICD-10-CM

## 2023-01-08 DIAGNOSIS — N186 End stage renal disease: Secondary | ICD-10-CM | POA: Diagnosis not present

## 2023-01-08 DIAGNOSIS — Z992 Dependence on renal dialysis: Secondary | ICD-10-CM | POA: Diagnosis not present

## 2023-01-08 MED ORDER — LACTULOSE 10 GM/15ML PO SOLN: 30.0000 g | Freq: Every day | ORAL | 11 refills | Status: AC | PRN

## 2023-01-08 MED ORDER — LUBIPROSTONE 24 MCG PO CAPS: 24.0000 ug | ORAL_CAPSULE | Freq: Two times a day (BID) | ORAL | 11 refills | Status: DC

## 2023-01-08 NOTE — Patient Instructions (Addendum)
We have sent the following medications to your pharmacy for you to pick up at your convenience: Amitiza, Lactulose  You will be due for a follow up appointment in 2025. We will send you a reminder in the mail when it gets closer to that time.  _______________________________________________________  If your blood pressure at your visit was 140/90 or greater, please contact your primary care physician to follow up on this.  _______________________________________________________  If you are age 57 or older, your body mass index should be between 23-30. Your Body mass index is 28.41 kg/m. If this is out of the aforementioned range listed, please consider follow up with your Primary Care Provider.  If you are age 67 or younger, your body mass index should be between 19-25. Your Body mass index is 28.41 kg/m. If this is out of the aformentioned range listed, please consider follow up with your Primary Care Provider.   ________________________________________________________  The [facility] providers would like to encourage you to use [facility] to communicate with providers for non-urgent requests or questions.  Due to long hold times on the telephone, sending your provider a message by [facility] may be a faster and more efficient way to get a response.  Please allow 48 business hours for a response.  Please remember that this is for non-urgent requests.  _______________________________________________________

## 2023-01-08 NOTE — Progress Notes (Signed)
   Subjective:    Patient ID: Albert Eaton, male    DOB: 01-12-1966, 57 y.o.   MRN: [id number]  HPI Albert Eaton is a 57 year old male with a history of GERD, hiccups, adenomatous colon polyps, constipation, ESRD, HIV, hypothyroidism, atrial fibrillation on Eliquis, severe mitral regurgitation who is here for follow-up.  He was last seen for colonoscopy in July 2022.  He was seen in the office by Nicoletta Ba in June 2022.  EGD in June 2022 was normal with the exception of a small hiatal hernia. Colonoscopy July 2022 normal, 5-year surveillance colonoscopy recommended given history of adenomatous polyps including those greater than a centimeter.  He reports he has been doing well of late.  Amitiza 24 mcg twice daily is working well.  He is typically having a bowel movement once daily.  Occasionally he will use some lactulose if he skips 1 or 2 days between bowel movements.  When he does not have a bowel movement daily he can feel bloating and lower abdominal pressure but no true pain.  He has had no blood in stool or melena.  He is not using senna or Colace.  Heartburn is infrequent and when present he will use famotidine once daily as needed.  No dysphagia.  No further issues with hiccups.   Review of Systems As per HPI, otherwise negative    Objective:   Physical Exam BP 138/80   Pulse (!) 50   Ht 5\' 10"  (1.778 m)   Wt 198 lb (89.8 kg)   BMI 28.41 kg/m  Gen: awake, alert, NAD HEENT: anicteric  CV: RRR, soft sem Pulm: CTA b/l Abd: soft, NT/ND, +BS throughout Ext: no c/c/e Neuro: nonfocal     Latest Ref Rng & Units 12/02/2022    9:29 AM 10/29/2022    6:53 PM 09/14/2022    8:15 AM  CBC  WBC 3.8 - 10.8 Thousand/uL 4.2  10.5  9.1   Hemoglobin 13.2 - 17.1 g/dL 13.0  9.9  8.0   Hematocrit 38.5 - 50.0 % 40.1  32.5  24.6   Platelets 140 - 400 Thousand/uL 268  392  367          Assessment & Plan:  57 year old male with a history of GERD, hiccups, adenomatous colon polyps,  constipation, ESRD, HIV, hypothyroidism, atrial fibrillation on Eliquis, severe mitral regurgitation who is here for follow-up.  Chronic constipation --currently therapy is working well -- Amitiza 24 mcg twice daily -- Refill of lactulose 30 g once to twice daily as needed only for constipation  2.  Prior GERD and hiccups --rare heartburn at this point alleviated by famotidine as needed.  Hiccups no longer a problem. -- Famotidine 20 mg twice daily as needed  3.  History of adenomatous colon polyps --5 adenomas including 1 greater than a centimeter in 2019.  Normal colonoscopy in 2022. -- Surveillance colonoscopy 2027  Annual follow-up, sooner if needed

## 2023-01-09 DIAGNOSIS — Z992 Dependence on renal dialysis: Secondary | ICD-10-CM | POA: Diagnosis not present

## 2023-01-09 DIAGNOSIS — N2581 Secondary hyperparathyroidism of renal origin: Secondary | ICD-10-CM | POA: Diagnosis not present

## 2023-01-09 DIAGNOSIS — E876 Hypokalemia: Secondary | ICD-10-CM | POA: Diagnosis not present

## 2023-01-09 DIAGNOSIS — D509 Iron deficiency anemia, unspecified: Secondary | ICD-10-CM | POA: Diagnosis not present

## 2023-01-09 DIAGNOSIS — D631 Anemia in chronic kidney disease: Secondary | ICD-10-CM | POA: Diagnosis not present

## 2023-01-09 DIAGNOSIS — E079 Disorder of thyroid, unspecified: Secondary | ICD-10-CM | POA: Diagnosis not present

## 2023-01-09 DIAGNOSIS — N186 End stage renal disease: Secondary | ICD-10-CM | POA: Diagnosis not present

## 2023-01-09 LAB — CYTOLOGY - PAP: Diagnosis: NEGATIVE

## 2023-01-12 DIAGNOSIS — D631 Anemia in chronic kidney disease: Secondary | ICD-10-CM | POA: Diagnosis not present

## 2023-01-12 DIAGNOSIS — E876 Hypokalemia: Secondary | ICD-10-CM | POA: Diagnosis not present

## 2023-01-12 DIAGNOSIS — N186 End stage renal disease: Secondary | ICD-10-CM | POA: Diagnosis not present

## 2023-01-12 DIAGNOSIS — E079 Disorder of thyroid, unspecified: Secondary | ICD-10-CM | POA: Diagnosis not present

## 2023-01-12 DIAGNOSIS — N2581 Secondary hyperparathyroidism of renal origin: Secondary | ICD-10-CM | POA: Diagnosis not present

## 2023-01-12 DIAGNOSIS — D509 Iron deficiency anemia, unspecified: Secondary | ICD-10-CM | POA: Diagnosis not present

## 2023-01-12 DIAGNOSIS — Z992 Dependence on renal dialysis: Secondary | ICD-10-CM | POA: Diagnosis not present

## 2023-01-13 DIAGNOSIS — M9903 Segmental and somatic dysfunction of lumbar region: Secondary | ICD-10-CM | POA: Diagnosis not present

## 2023-01-13 DIAGNOSIS — M5386 Other specified dorsopathies, lumbar region: Secondary | ICD-10-CM | POA: Diagnosis not present

## 2023-01-13 DIAGNOSIS — M9905 Segmental and somatic dysfunction of pelvic region: Secondary | ICD-10-CM | POA: Diagnosis not present

## 2023-01-13 DIAGNOSIS — M9904 Segmental and somatic dysfunction of sacral region: Secondary | ICD-10-CM | POA: Diagnosis not present

## 2023-01-14 DIAGNOSIS — Z992 Dependence on renal dialysis: Secondary | ICD-10-CM | POA: Diagnosis not present

## 2023-01-14 DIAGNOSIS — D509 Iron deficiency anemia, unspecified: Secondary | ICD-10-CM | POA: Diagnosis not present

## 2023-01-14 DIAGNOSIS — E079 Disorder of thyroid, unspecified: Secondary | ICD-10-CM | POA: Diagnosis not present

## 2023-01-14 DIAGNOSIS — N2581 Secondary hyperparathyroidism of renal origin: Secondary | ICD-10-CM | POA: Diagnosis not present

## 2023-01-14 DIAGNOSIS — E876 Hypokalemia: Secondary | ICD-10-CM | POA: Diagnosis not present

## 2023-01-14 DIAGNOSIS — D631 Anemia in chronic kidney disease: Secondary | ICD-10-CM | POA: Diagnosis not present

## 2023-01-14 DIAGNOSIS — N186 End stage renal disease: Secondary | ICD-10-CM | POA: Diagnosis not present

## 2023-01-16 DIAGNOSIS — N2581 Secondary hyperparathyroidism of renal origin: Secondary | ICD-10-CM | POA: Diagnosis not present

## 2023-01-16 DIAGNOSIS — Z992 Dependence on renal dialysis: Secondary | ICD-10-CM | POA: Diagnosis not present

## 2023-01-16 DIAGNOSIS — E876 Hypokalemia: Secondary | ICD-10-CM | POA: Diagnosis not present

## 2023-01-16 DIAGNOSIS — D509 Iron deficiency anemia, unspecified: Secondary | ICD-10-CM | POA: Diagnosis not present

## 2023-01-16 DIAGNOSIS — E079 Disorder of thyroid, unspecified: Secondary | ICD-10-CM | POA: Diagnosis not present

## 2023-01-16 DIAGNOSIS — D631 Anemia in chronic kidney disease: Secondary | ICD-10-CM | POA: Diagnosis not present

## 2023-01-16 DIAGNOSIS — N186 End stage renal disease: Secondary | ICD-10-CM | POA: Diagnosis not present

## 2023-01-17 ENCOUNTER — Other Ambulatory Visit: Payer: Self-pay | Admitting: Allergy & Immunology

## 2023-01-19 DIAGNOSIS — D631 Anemia in chronic kidney disease: Secondary | ICD-10-CM | POA: Diagnosis not present

## 2023-01-19 DIAGNOSIS — Z992 Dependence on renal dialysis: Secondary | ICD-10-CM | POA: Diagnosis not present

## 2023-01-19 DIAGNOSIS — E079 Disorder of thyroid, unspecified: Secondary | ICD-10-CM | POA: Diagnosis not present

## 2023-01-19 DIAGNOSIS — N2581 Secondary hyperparathyroidism of renal origin: Secondary | ICD-10-CM | POA: Diagnosis not present

## 2023-01-19 DIAGNOSIS — E876 Hypokalemia: Secondary | ICD-10-CM | POA: Diagnosis not present

## 2023-01-19 DIAGNOSIS — D509 Iron deficiency anemia, unspecified: Secondary | ICD-10-CM | POA: Diagnosis not present

## 2023-01-19 DIAGNOSIS — N186 End stage renal disease: Secondary | ICD-10-CM | POA: Diagnosis not present

## 2023-01-20 DIAGNOSIS — M9903 Segmental and somatic dysfunction of lumbar region: Secondary | ICD-10-CM | POA: Diagnosis not present

## 2023-01-20 DIAGNOSIS — M9905 Segmental and somatic dysfunction of pelvic region: Secondary | ICD-10-CM | POA: Diagnosis not present

## 2023-01-20 DIAGNOSIS — M5386 Other specified dorsopathies, lumbar region: Secondary | ICD-10-CM | POA: Diagnosis not present

## 2023-01-20 DIAGNOSIS — M9904 Segmental and somatic dysfunction of sacral region: Secondary | ICD-10-CM | POA: Diagnosis not present

## 2023-01-21 DIAGNOSIS — N186 End stage renal disease: Secondary | ICD-10-CM | POA: Diagnosis not present

## 2023-01-21 DIAGNOSIS — D631 Anemia in chronic kidney disease: Secondary | ICD-10-CM | POA: Diagnosis not present

## 2023-01-21 DIAGNOSIS — N2581 Secondary hyperparathyroidism of renal origin: Secondary | ICD-10-CM | POA: Diagnosis not present

## 2023-01-21 DIAGNOSIS — E876 Hypokalemia: Secondary | ICD-10-CM | POA: Diagnosis not present

## 2023-01-21 DIAGNOSIS — E079 Disorder of thyroid, unspecified: Secondary | ICD-10-CM | POA: Diagnosis not present

## 2023-01-21 DIAGNOSIS — Z992 Dependence on renal dialysis: Secondary | ICD-10-CM | POA: Diagnosis not present

## 2023-01-21 DIAGNOSIS — D509 Iron deficiency anemia, unspecified: Secondary | ICD-10-CM | POA: Diagnosis not present

## 2023-01-22 DIAGNOSIS — N186 End stage renal disease: Secondary | ICD-10-CM | POA: Diagnosis not present

## 2023-01-22 DIAGNOSIS — E8770 Fluid overload, unspecified: Secondary | ICD-10-CM | POA: Diagnosis not present

## 2023-01-22 DIAGNOSIS — N2581 Secondary hyperparathyroidism of renal origin: Secondary | ICD-10-CM | POA: Diagnosis not present

## 2023-01-22 DIAGNOSIS — Z992 Dependence on renal dialysis: Secondary | ICD-10-CM | POA: Diagnosis not present

## 2023-01-23 DIAGNOSIS — E876 Hypokalemia: Secondary | ICD-10-CM | POA: Diagnosis not present

## 2023-01-23 DIAGNOSIS — Z992 Dependence on renal dialysis: Secondary | ICD-10-CM | POA: Diagnosis not present

## 2023-01-23 DIAGNOSIS — D509 Iron deficiency anemia, unspecified: Secondary | ICD-10-CM | POA: Diagnosis not present

## 2023-01-23 DIAGNOSIS — N2581 Secondary hyperparathyroidism of renal origin: Secondary | ICD-10-CM | POA: Diagnosis not present

## 2023-01-23 DIAGNOSIS — D631 Anemia in chronic kidney disease: Secondary | ICD-10-CM | POA: Diagnosis not present

## 2023-01-23 DIAGNOSIS — E079 Disorder of thyroid, unspecified: Secondary | ICD-10-CM | POA: Diagnosis not present

## 2023-01-23 DIAGNOSIS — N186 End stage renal disease: Secondary | ICD-10-CM | POA: Diagnosis not present

## 2023-01-25 DIAGNOSIS — N186 End stage renal disease: Secondary | ICD-10-CM | POA: Diagnosis not present

## 2023-01-25 DIAGNOSIS — I129 Hypertensive chronic kidney disease with stage 1 through stage 4 chronic kidney disease, or unspecified chronic kidney disease: Secondary | ICD-10-CM | POA: Diagnosis not present

## 2023-01-25 DIAGNOSIS — Z992 Dependence on renal dialysis: Secondary | ICD-10-CM | POA: Diagnosis not present

## 2023-01-26 DIAGNOSIS — Z992 Dependence on renal dialysis: Secondary | ICD-10-CM | POA: Diagnosis not present

## 2023-01-26 DIAGNOSIS — N186 End stage renal disease: Secondary | ICD-10-CM | POA: Diagnosis not present

## 2023-01-26 DIAGNOSIS — N2581 Secondary hyperparathyroidism of renal origin: Secondary | ICD-10-CM | POA: Diagnosis not present

## 2023-01-27 DIAGNOSIS — M9905 Segmental and somatic dysfunction of pelvic region: Secondary | ICD-10-CM | POA: Diagnosis not present

## 2023-01-27 DIAGNOSIS — M9904 Segmental and somatic dysfunction of sacral region: Secondary | ICD-10-CM | POA: Diagnosis not present

## 2023-01-27 DIAGNOSIS — M9903 Segmental and somatic dysfunction of lumbar region: Secondary | ICD-10-CM | POA: Diagnosis not present

## 2023-01-27 DIAGNOSIS — M5386 Other specified dorsopathies, lumbar region: Secondary | ICD-10-CM | POA: Diagnosis not present

## 2023-01-28 DIAGNOSIS — Z992 Dependence on renal dialysis: Secondary | ICD-10-CM | POA: Diagnosis not present

## 2023-01-28 DIAGNOSIS — N2581 Secondary hyperparathyroidism of renal origin: Secondary | ICD-10-CM | POA: Diagnosis not present

## 2023-01-28 DIAGNOSIS — N186 End stage renal disease: Secondary | ICD-10-CM | POA: Diagnosis not present

## 2023-01-29 DIAGNOSIS — Z01818 Encounter for other preprocedural examination: Secondary | ICD-10-CM | POA: Diagnosis not present

## 2023-01-30 DIAGNOSIS — N2581 Secondary hyperparathyroidism of renal origin: Secondary | ICD-10-CM | POA: Diagnosis not present

## 2023-01-30 DIAGNOSIS — Z992 Dependence on renal dialysis: Secondary | ICD-10-CM | POA: Diagnosis not present

## 2023-01-30 DIAGNOSIS — N186 End stage renal disease: Secondary | ICD-10-CM | POA: Diagnosis not present

## 2023-02-02 DIAGNOSIS — Z992 Dependence on renal dialysis: Secondary | ICD-10-CM | POA: Diagnosis not present

## 2023-02-02 DIAGNOSIS — N2581 Secondary hyperparathyroidism of renal origin: Secondary | ICD-10-CM | POA: Diagnosis not present

## 2023-02-02 DIAGNOSIS — N186 End stage renal disease: Secondary | ICD-10-CM | POA: Diagnosis not present

## 2023-02-04 DIAGNOSIS — Z992 Dependence on renal dialysis: Secondary | ICD-10-CM | POA: Diagnosis not present

## 2023-02-04 DIAGNOSIS — N2581 Secondary hyperparathyroidism of renal origin: Secondary | ICD-10-CM | POA: Diagnosis not present

## 2023-02-04 DIAGNOSIS — N186 End stage renal disease: Secondary | ICD-10-CM | POA: Diagnosis not present

## 2023-02-06 DIAGNOSIS — Z992 Dependence on renal dialysis: Secondary | ICD-10-CM | POA: Diagnosis not present

## 2023-02-06 DIAGNOSIS — N2581 Secondary hyperparathyroidism of renal origin: Secondary | ICD-10-CM | POA: Diagnosis not present

## 2023-02-06 DIAGNOSIS — N186 End stage renal disease: Secondary | ICD-10-CM | POA: Diagnosis not present

## 2023-02-10 DIAGNOSIS — N186 End stage renal disease: Secondary | ICD-10-CM | POA: Diagnosis not present

## 2023-02-10 DIAGNOSIS — N2581 Secondary hyperparathyroidism of renal origin: Secondary | ICD-10-CM | POA: Diagnosis not present

## 2023-02-10 DIAGNOSIS — Z992 Dependence on renal dialysis: Secondary | ICD-10-CM | POA: Diagnosis not present

## 2023-02-11 DIAGNOSIS — N186 End stage renal disease: Secondary | ICD-10-CM | POA: Diagnosis not present

## 2023-02-11 DIAGNOSIS — Z992 Dependence on renal dialysis: Secondary | ICD-10-CM | POA: Diagnosis not present

## 2023-02-11 DIAGNOSIS — N2581 Secondary hyperparathyroidism of renal origin: Secondary | ICD-10-CM | POA: Diagnosis not present

## 2023-02-13 DIAGNOSIS — N186 End stage renal disease: Secondary | ICD-10-CM | POA: Diagnosis not present

## 2023-02-13 DIAGNOSIS — Z992 Dependence on renal dialysis: Secondary | ICD-10-CM | POA: Diagnosis not present

## 2023-02-13 DIAGNOSIS — N2581 Secondary hyperparathyroidism of renal origin: Secondary | ICD-10-CM | POA: Diagnosis not present

## 2023-02-16 ENCOUNTER — Other Ambulatory Visit: Payer: Self-pay | Admitting: Allergy & Immunology

## 2023-02-16 DIAGNOSIS — I2510 Atherosclerotic heart disease of native coronary artery without angina pectoris: Secondary | ICD-10-CM | POA: Insufficient documentation

## 2023-02-16 DIAGNOSIS — N2581 Secondary hyperparathyroidism of renal origin: Secondary | ICD-10-CM | POA: Diagnosis not present

## 2023-02-16 DIAGNOSIS — N186 End stage renal disease: Secondary | ICD-10-CM | POA: Diagnosis not present

## 2023-02-16 DIAGNOSIS — Z992 Dependence on renal dialysis: Secondary | ICD-10-CM | POA: Diagnosis not present

## 2023-02-16 NOTE — Progress Notes (Unsigned)
  Cardiology Office Note:    Date:  02/16/2023  ID:  Albert Eaton, DOB July 10, 1966, MRN [id number] PCP: [name], NP   HeartCare Providers Cardiologist:  Tonny Bollman, MD Electrophysiologist:  Lanier Prude, MD { Click to update primary MD,subspecialty MD or APP then REFRESH:1}    {Click to Open Review  :1}   Patient Profile:   Coronary artery disease, nonobstructive  LHC 09/11/2022: LAD proximal 50, mid 50; OM2 30; RCA proximal 40 HFmrEF (heart failure with mildly reduced ejection fraction)  TTE with bubble study 08/22/2022: EF 45-50, global HK, mild LVH, GR 2 DD, RV pressure and volume overload, mild reduced RVSF, moderately elevated PASP, RVSP 50, severe BAE, s/p MV repair with trivial MR, severe TR, RAP 15 Limited TTE 09/09/2022: EF 45-50, global HK, moderate LVH, moderate RVE, s/p MV repair with trivial MR, trivial TR Paroxysmal atrial fibrillation  Amiodarone, Apixaban Mitral regurgitation S/p MV repair (min inv) 07/2022 (Dr. Leafy Ro) Tricuspid regurgitation Hx of PEA arrest  Occurred during dialysis 08/2022 - ?initial rhythm SVT vs AFib Monitor 09/2022: NSR, A-fib burden <1% (controlled rate and RVR), no high-grade AV block, few short ventricular runs  ESRD on hemodialysis  Hypertension  Hyperlipidemia  Aortic atherosclerosis Hypothyroidism  OSA  HIV Renal CA s.o L nephrectomy  Hep B Pre-CABG Dopplers 08/19/2022: Bilateral ICA with minimal wall thickening    History of Present Illness:   Albert Eaton is a 57 y.o. male who returns for f/u on mitral regurgitation s/p MV repair, CHF, AFib. He was last seen by Dr. Excell Seltzer 11/18/22. Plan was to continue Amiodarone for another 3 months and then DC. He would ultimately need another monitor to screen for recurrent atrial fibrillation before considering DC of anticoagulation. ***  ROS ***    Studies Reviewed:    EKG:  ***  *** Risk Assessment/Calculations:   {Does this patient have ATRIAL  FIBRILLATION?:[phone]} No BP recorded.  {Refresh Note OR Click here to enter BP  :1}***       Physical Exam:   VS:  There were no vitals taken for this visit.   Wt Readings from Last 3 Encounters:  01/08/23 198 lb (89.8 kg)  01/06/23 201 lb (91.2 kg)  01/01/23 195 lb 8 oz (88.7 kg)    Physical Exam***    ASSESSMENT AND PLAN:   No problem-specific Assessment & Plan notes found for this encounter. ***    {Are you ordering a CV Procedure (e.g. stress test, cath, DCCV, TEE, etc)?   Press F2        :[id number]}  Dispo:  No follow-ups on file.  Signed, [name], PA-C

## 2023-02-17 ENCOUNTER — Encounter: Payer: Self-pay | Admitting: Physician Assistant

## 2023-02-17 ENCOUNTER — Other Ambulatory Visit (HOSPITAL_COMMUNITY): Payer: Medicare PPO

## 2023-02-17 ENCOUNTER — Ambulatory Visit: Payer: Medicare PPO | Attending: Physician Assistant | Admitting: Physician Assistant

## 2023-02-17 ENCOUNTER — Ambulatory Visit (HOSPITAL_BASED_OUTPATIENT_CLINIC_OR_DEPARTMENT_OTHER): Payer: Medicare PPO

## 2023-02-17 ENCOUNTER — Ambulatory Visit
Admission: RE | Admit: 2023-02-17 | Discharge: 2023-02-17 | Disposition: A | Discharge status: Home / Self Care | Payer: Medicare PPO | Source: Ambulatory Visit | Attending: Physician Assistant | Admitting: Physician Assistant

## 2023-02-17 VITALS — BP 80/50 | HR 70 | Ht 70.0 in | Wt 201.2 lb

## 2023-02-17 DIAGNOSIS — I2510 Atherosclerotic heart disease of native coronary artery without angina pectoris: Secondary | ICD-10-CM | POA: Diagnosis not present

## 2023-02-17 DIAGNOSIS — I77810 Thoracic aortic ectasia: Secondary | ICD-10-CM | POA: Insufficient documentation

## 2023-02-17 DIAGNOSIS — I340 Nonrheumatic mitral (valve) insufficiency: Secondary | ICD-10-CM

## 2023-02-17 DIAGNOSIS — I480 Paroxysmal atrial fibrillation: Secondary | ICD-10-CM

## 2023-02-17 DIAGNOSIS — I10 Essential (primary) hypertension: Secondary | ICD-10-CM

## 2023-02-17 DIAGNOSIS — R079 Chest pain, unspecified: Secondary | ICD-10-CM | POA: Diagnosis not present

## 2023-02-17 DIAGNOSIS — R072 Precordial pain: Secondary | ICD-10-CM | POA: Insufficient documentation

## 2023-02-17 DIAGNOSIS — I5022 Chronic systolic (congestive) heart failure: Secondary | ICD-10-CM | POA: Diagnosis not present

## 2023-02-17 DIAGNOSIS — I959 Hypotension, unspecified: Secondary | ICD-10-CM | POA: Insufficient documentation

## 2023-02-17 HISTORY — DX: Thoracic aortic ectasia: I77.810

## 2023-02-17 LAB — ECHOCARDIOGRAM LIMITED
Height: 70 in
S' Lateral: 3.6 cm
Weight: 3219.2 oz

## 2023-02-17 NOTE — Assessment & Plan Note (Addendum)
Noted during admission in 08/2022 with pleural effusion. Apixaban started at that time. Reviewed recent notes from Dr. Excell Seltzer. Plan was to DC Amiodarone at this visit. Consider repeating event monitor at some point to determine if anticoagulation can be DC'd. QTc prolonged. This is likely related to Amiodarone. DC Amiodarone Continue Eliquis 5 mg twice daily (Wt > 60 kg, Age < 80).

## 2023-02-17 NOTE — Assessment & Plan Note (Signed)
He presents with mainly pleuritic chest pain. Question MSK chest pain related to retching during dialysis last week. His O2 sats are normal. He is not tachycardic. He has been on anticoagulation with Eliquis. His EKG is essentially unchanged. We were able to get a stat limited Echocardiogram while he was in the office today. Preliminary results are neg for pericardial effusion. His cath in Nov 2023 demonstrated mod nonobstructive coronary artery disease. There are no symptoms to indicate worsening coronary artery disease or ischemia. He is hypotensive today and is due for dialysis again tomorrow. Given the results of his limited echocardiogram, I suspect his symptoms are all musculoskeletal. I do think we should get a CXR to rule out pleural effusion, consolidation. I reviewed his case with Dr. Eldridge Dace (attending MD) today. Obtain CXR Obtain labs from dialysis yesterday Short course of NSAIDs (Ibuprofen 400 mg three times a day x 3 days) - if ok w Nephrology. I have asked him to clear this with Nephrology first Go to ED if symptoms worsen, change

## 2023-02-17 NOTE — Assessment & Plan Note (Signed)
EF 45-50 by TTE in 08/2022. His volume is managed by dialysis. NYHA II. BP is too soft to consider adjustments in GDMT. Continue metoprolol 25 mg twice daily.

## 2023-02-17 NOTE — Assessment & Plan Note (Signed)
Mod nonobstructive CAD on cath in 08/2022. He is having chest pain today that seems most c/w MSK etiology. I do not think that he needs an ischemic evaluation. He is not on ASA as he is on Eliquis. Continue Crestor 10 mg once daily.

## 2023-02-17 NOTE — Assessment & Plan Note (Addendum)
BP is quite low today. He is minimally symptomatic. As noted, I reviewed his case with Dr. Eldridge Dace today. Will obtain recent labs from dialysis (drawn yesterday) to assess for worsening anemia. Hold Amlodipine and Metoprolol this AM. If BP later today is < 130/80, hold evening dose of Metoprolol. Gently increase fluids today and gently liberalize salt today. He knows to go to the ED if his symptoms worsen. If BP continues to run low, will decrease/stop Amlodipine.

## 2023-02-17 NOTE — Assessment & Plan Note (Signed)
S/p min invasive MV repair 07/2022. TTE 08/2022 with normal function. Continue SBE prophylaxis.

## 2023-02-17 NOTE — Patient Instructions (Signed)
Medication Instructions:  Your physician recommends that you continue on your current medications as directed. Please refer to the Current Medication list given to you today.  BUT HOLD YOUR AMLODIPINE AND METOPROLOL THIS MORNING.  CHECK YOUR PRESSURE TONIGHT IF YOUR BLOOD PRESSURE IS LOWER THAN 130/80, HOLD THE METOPROLOL TONIGHT AS WELL.  EAT SOMETHING SALTY TODAY, MAYBE CHICKEN NOODLE SOUP INCREASE YOUR FLUIDS TODAY TAKE IT EASY TODAY  *If you need a refill on your cardiac medications before your next appointment, please call your pharmacy*   Lab Work: None ordered  If you have labs (blood work) drawn today and your tests are completely normal, you will receive your results only by: MyChart Message (if you have MyChart) OR A paper copy in the mail If you have any lab test that is abnormal or we need to change your treatment, we will call you to review the results.   Testing/Procedures: Your physician has requested that you have an echocardiogram. Echocardiography is a painless test that uses sound waves to create images of your heart. It provides your doctor with information about the size and shape of your heart and how well your heart's chambers and valves are working. This procedure takes approximately one hour. There are no restrictions for this procedure. Please do NOT wear cologne, perfume, aftershave, or lotions (deodorant is allowed). Please arrive 15 minutes prior to your appointment time.   A chest x-ray takes a picture of the organs and structures inside the chest, including the heart, lungs, and blood vessels. This test can show several things, including, whether the heart is enlarges; whether fluid is building up in the lungs; and whether pacemaker / defibrillator leads are still in place. Go to [facility] [address] Today for this   Follow-Up: At [facility], you and your health needs are our priority.  As part of our continuing mission to  provide you with exceptional heart care, we have created designated Provider Care Teams.  These Care Teams include your primary Cardiologist (physician) and Advanced Practice Providers (APPs -  Physician Assistants and Nurse Practitioners) who all work together to provide you with the care you need, when you need it.  We recommend signing up for the patient portal called "MyChart".  Sign up information is provided on this After Visit Summary.  MyChart is used to connect with patients for Virtual Visits (Telemedicine).  Patients are able to view lab/test results, encounter notes, upcoming appointments, etc.  Non-urgent messages can be sent to your provider as well.   To learn more about what you can do with MyChart, go to [email or web address].    Your next appointment:   05/05/23 arrive at 9:00   Provider:   [name], PA-C         Other Instructions  Ask your Kidney dr if it is ok for you to take Ibuprofen 400 mg three times a day.  If so, then take it.

## 2023-02-18 DIAGNOSIS — Z992 Dependence on renal dialysis: Secondary | ICD-10-CM | POA: Diagnosis not present

## 2023-02-18 DIAGNOSIS — N2581 Secondary hyperparathyroidism of renal origin: Secondary | ICD-10-CM | POA: Diagnosis not present

## 2023-02-18 DIAGNOSIS — N186 End stage renal disease: Secondary | ICD-10-CM | POA: Diagnosis not present

## 2023-02-19 ENCOUNTER — Other Ambulatory Visit: Payer: Self-pay | Admitting: Physician Assistant

## 2023-02-19 MED ORDER — AMLODIPINE BESYLATE 10 MG PO TABS
5.0000 mg | ORAL_TABLET | Freq: Every day | ORAL | 1 refills | Status: DC
Start: 2023-02-19 — End: 2023-03-22

## 2023-02-20 DIAGNOSIS — Z992 Dependence on renal dialysis: Secondary | ICD-10-CM | POA: Diagnosis not present

## 2023-02-20 DIAGNOSIS — N2581 Secondary hyperparathyroidism of renal origin: Secondary | ICD-10-CM | POA: Diagnosis not present

## 2023-02-20 DIAGNOSIS — N186 End stage renal disease: Secondary | ICD-10-CM | POA: Diagnosis not present

## 2023-02-23 ENCOUNTER — Telehealth: Payer: Self-pay | Admitting: Cardiovascular Disease

## 2023-02-23 ENCOUNTER — Telehealth: Payer: Self-pay | Admitting: *Deleted

## 2023-02-23 DIAGNOSIS — Z992 Dependence on renal dialysis: Secondary | ICD-10-CM | POA: Diagnosis not present

## 2023-02-23 DIAGNOSIS — N2581 Secondary hyperparathyroidism of renal origin: Secondary | ICD-10-CM | POA: Diagnosis not present

## 2023-02-23 DIAGNOSIS — N186 End stage renal disease: Secondary | ICD-10-CM | POA: Diagnosis not present

## 2023-02-23 DIAGNOSIS — R9389 Abnormal findings on diagnostic imaging of other specified body structures: Secondary | ICD-10-CM

## 2023-02-23 NOTE — Telephone Encounter (Signed)
-----   Message from Beatrice Lecher, PA-C sent at 02/21/2023  1:40 PM EDT ----- See MyChart message. Spoke with patient on phone today. No cough or symptoms of pneumonia. His chest pain is improving. PLAN: -Please arrange repeat CXR in 4 weeks (Dx: abnormal CXR)  Albert Eaton  Your chest X-ray shows a patchy area in the right lower lung. It is not clear if this represents an infection (pneumonia) or if it is just a shadow. As we discussed on the phone today, it does not sound like you have symptoms of pneumonia. So, I do not think we have to start antibiotics. If you develop a cough with yellow, green or bloody sputum or start to run fevers or feel sick, let me know. Otherwise, I will get a repeat chest X ray in a few weeks to make sure this area clears up. [name], PA-C    02/21/2023 1:25 PM

## 2023-02-23 NOTE — Telephone Encounter (Signed)
Office called to speak with nurse about chest xray report, call transferred

## 2023-02-23 NOTE — Telephone Encounter (Signed)
Tiffany calling in to report chest x-ray results. Advised that we have received the results and they have been reviewed by the provider.

## 2023-02-24 ENCOUNTER — Encounter: Payer: Self-pay | Admitting: Nephrology

## 2023-02-24 DIAGNOSIS — Z992 Dependence on renal dialysis: Secondary | ICD-10-CM | POA: Diagnosis not present

## 2023-02-24 DIAGNOSIS — I129 Hypertensive chronic kidney disease with stage 1 through stage 4 chronic kidney disease, or unspecified chronic kidney disease: Secondary | ICD-10-CM | POA: Diagnosis not present

## 2023-02-24 DIAGNOSIS — N186 End stage renal disease: Secondary | ICD-10-CM | POA: Diagnosis not present

## 2023-02-24 MED ORDER — AMOXICILLIN-POT CLAVULANATE 500-125 MG PO TABS: 1.0000 | ORAL_TABLET | Freq: Two times a day (BID) | ORAL | 0 refills | Status: AC

## 2023-02-24 MED ORDER — DOXYCYCLINE MONOHYDRATE 100 MG PO TABS: 100.0000 mg | ORAL_TABLET | Freq: Two times a day (BID) | ORAL | 0 refills | Status: AC

## 2023-02-24 NOTE — Telephone Encounter (Addendum)
I spoke with pt by phone. Start Augmentin 500 mg every 12 hours for 5 days. Start Doxycycline 100 mg every 12 hours for 5 days. Rx's send to pharmacy. Please arrange f/u with primary care in the next 1-2 weeks for possible pneumonia. Make sure pt has f/u CXR arranged in the next 3-4 weeks. [name], PA-C    02/24/2023 4:26 PM

## 2023-02-24 NOTE — Addendum Note (Signed)
Addended byTereso Newcomer T on: 02/24/2023 04:35 PM   Modules accepted: Orders

## 2023-02-25 DIAGNOSIS — N186 End stage renal disease: Secondary | ICD-10-CM | POA: Diagnosis not present

## 2023-02-25 DIAGNOSIS — Z992 Dependence on renal dialysis: Secondary | ICD-10-CM | POA: Diagnosis not present

## 2023-02-25 DIAGNOSIS — N2581 Secondary hyperparathyroidism of renal origin: Secondary | ICD-10-CM | POA: Diagnosis not present

## 2023-02-27 DIAGNOSIS — N186 End stage renal disease: Secondary | ICD-10-CM | POA: Diagnosis not present

## 2023-02-27 DIAGNOSIS — N2581 Secondary hyperparathyroidism of renal origin: Secondary | ICD-10-CM | POA: Diagnosis not present

## 2023-02-27 DIAGNOSIS — Z992 Dependence on renal dialysis: Secondary | ICD-10-CM | POA: Diagnosis not present

## 2023-03-02 DIAGNOSIS — N186 End stage renal disease: Secondary | ICD-10-CM | POA: Diagnosis not present

## 2023-03-02 DIAGNOSIS — N2581 Secondary hyperparathyroidism of renal origin: Secondary | ICD-10-CM | POA: Diagnosis not present

## 2023-03-02 DIAGNOSIS — Z992 Dependence on renal dialysis: Secondary | ICD-10-CM | POA: Diagnosis not present

## 2023-03-04 DIAGNOSIS — N186 End stage renal disease: Secondary | ICD-10-CM | POA: Diagnosis not present

## 2023-03-04 DIAGNOSIS — N2581 Secondary hyperparathyroidism of renal origin: Secondary | ICD-10-CM | POA: Diagnosis not present

## 2023-03-04 DIAGNOSIS — Z992 Dependence on renal dialysis: Secondary | ICD-10-CM | POA: Diagnosis not present

## 2023-03-05 ENCOUNTER — Ambulatory Visit (INDEPENDENT_AMBULATORY_CARE_PROVIDER_SITE_OTHER): Payer: Medicare PPO | Admitting: Nurse Practitioner

## 2023-03-05 ENCOUNTER — Encounter: Payer: Self-pay | Admitting: Nurse Practitioner

## 2023-03-05 VITALS — BP 112/66 | HR 75 | Temp 98.5°F | Resp 16 | Ht 70.0 in | Wt 205.2 lb

## 2023-03-05 DIAGNOSIS — R0789 Other chest pain: Secondary | ICD-10-CM | POA: Diagnosis not present

## 2023-03-05 NOTE — Patient Instructions (Signed)
Nice to see you today I do think that it is musculoskeletal in nature Continue using heat, ice, tylenol, and Voltaren gel as needed Avoid heavy lifting Follow up as scheduled. Sooner if you need me

## 2023-03-05 NOTE — Assessment & Plan Note (Signed)
Unclear etiology.  Patient continue doing conservative measures such as heat, ice, Voltaren gel, rest, Tylenol as needed.  Follow-up if no improvement.  Patient was informed to avoid NSAIDs not only to renal dysfunction but because he is on anticoagulation also.

## 2023-03-05 NOTE — Progress Notes (Signed)
   Acute Office Visit  Subjective:     Patient ID: Albert Eaton, male    DOB: 1966-07-29, 57 y.o.   MRN: [id number]  Chief Complaint  Patient presents with   Chest Pain    Right side top of chest X 1 week     Patient is in today for right sided chest pain   Patient was seen on 02/17/2023 at cardiology office. EKG was unchanged and limited echo was done and did not show concerns for effuison. He was hypotensive at the office and was due for dialysis the next day  States that he was treated with antibiotics and has completed them. States that he is having right sided anterior chest pain  States that it has come and gone for several episodes. States that at rest he can feel it. With certain movements and with coughing. States that he has had tried heating pad and voltaren. States that he has used tylenol and ibuprofen. State sthat it helped a little bit. States that he feels like it has improved since it started. States that if feels likean ache at rest. Movement and cough is a sharp pain   Review of Systems  Constitutional:  Negative for chills and fever.  Respiratory:  Negative for cough and shortness of breath.   Cardiovascular:  Positive for chest pain.        Objective:    BP 112/66   Pulse 75   Temp 98.5 F (36.9 C)   Resp 16   Ht 5\' 10"  (1.778 m)   Wt 205 lb 4 oz (93.1 kg)   SpO2 97%   BMI 29.45 kg/m  BP Readings from Last 3 Encounters:  03/05/23 112/66  02/17/23 (!) 80/50  01/08/23 138/80   Wt Readings from Last 3 Encounters:  03/05/23 205 lb 4 oz (93.1 kg)  02/17/23 201 lb 3.2 oz (91.3 kg)  01/08/23 198 lb (89.8 kg)      Physical Exam Vitals and nursing note reviewed.  Constitutional:      Appearance: Normal appearance.  Cardiovascular:     Rate and Rhythm: Normal rate and regular rhythm.     Heart sounds: Normal heart sounds.  Pulmonary:     Effort: Pulmonary effort is normal.     Breath sounds: Normal breath sounds.  Chest:     Chest wall:  Tenderness present.    Musculoskeletal:     Comments: Worse with flexion at waist Pain also made worse with patient pushing out anteriorly and with abduction of the right arm  Neurological:     Mental Status: He is alert.     No results found for any visits on 03/05/23.      Assessment & Plan:   Problem List Items Addressed This Visit       Other   Chest wall pain - Primary    Unclear etiology.  Patient continue doing conservative measures such as heat, ice, Voltaren gel, rest, Tylenol as needed.  Follow-up if no improvement.  Patient was informed to avoid NSAIDs not only to renal dysfunction but because he is on anticoagulation also.       No orders of the defined types were placed in this encounter.   Return if symptoms worsen or fail to improve, for As scheduled .  Audria Nine, NP

## 2023-03-06 DIAGNOSIS — N186 End stage renal disease: Secondary | ICD-10-CM | POA: Diagnosis not present

## 2023-03-06 DIAGNOSIS — Z992 Dependence on renal dialysis: Secondary | ICD-10-CM | POA: Diagnosis not present

## 2023-03-06 DIAGNOSIS — N2581 Secondary hyperparathyroidism of renal origin: Secondary | ICD-10-CM | POA: Diagnosis not present

## 2023-03-09 DIAGNOSIS — Z992 Dependence on renal dialysis: Secondary | ICD-10-CM | POA: Diagnosis not present

## 2023-03-09 DIAGNOSIS — N186 End stage renal disease: Secondary | ICD-10-CM | POA: Diagnosis not present

## 2023-03-09 DIAGNOSIS — N2581 Secondary hyperparathyroidism of renal origin: Secondary | ICD-10-CM | POA: Diagnosis not present

## 2023-03-09 DIAGNOSIS — Z7682 Awaiting organ transplant status: Secondary | ICD-10-CM | POA: Diagnosis not present

## 2023-03-10 ENCOUNTER — Other Ambulatory Visit: Payer: Self-pay | Admitting: Nurse Practitioner

## 2023-03-11 DIAGNOSIS — Z992 Dependence on renal dialysis: Secondary | ICD-10-CM | POA: Diagnosis not present

## 2023-03-11 DIAGNOSIS — N186 End stage renal disease: Secondary | ICD-10-CM | POA: Diagnosis not present

## 2023-03-11 DIAGNOSIS — N2581 Secondary hyperparathyroidism of renal origin: Secondary | ICD-10-CM | POA: Diagnosis not present

## 2023-03-13 ENCOUNTER — Encounter: Payer: Self-pay | Admitting: Family Medicine

## 2023-03-13 ENCOUNTER — Ambulatory Visit
Admission: RE | Admit: 2023-03-13 | Discharge: 2023-03-13 | Disposition: A | Discharge status: Home / Self Care | Payer: Medicare PPO | Source: Ambulatory Visit | Attending: Family Medicine | Admitting: Family Medicine

## 2023-03-13 ENCOUNTER — Ambulatory Visit (INDEPENDENT_AMBULATORY_CARE_PROVIDER_SITE_OTHER): Payer: Medicare PPO | Admitting: Family Medicine

## 2023-03-13 ENCOUNTER — Telehealth: Payer: Self-pay

## 2023-03-13 ENCOUNTER — Encounter: Payer: Self-pay | Admitting: Nurse Practitioner

## 2023-03-13 ENCOUNTER — Ambulatory Visit (INDEPENDENT_AMBULATORY_CARE_PROVIDER_SITE_OTHER)
Admission: RE | Admit: 2023-03-13 | Discharge: 2023-03-13 | Disposition: A | Discharge status: Home / Self Care | Payer: Medicare PPO | Source: Ambulatory Visit | Attending: Family Medicine | Admitting: Family Medicine

## 2023-03-13 ENCOUNTER — Other Ambulatory Visit: Payer: Self-pay | Admitting: Family Medicine

## 2023-03-13 VITALS — BP 132/88 | HR 71 | Temp 97.8°F | Ht 70.0 in | Wt 203.4 lb

## 2023-03-13 DIAGNOSIS — R079 Chest pain, unspecified: Secondary | ICD-10-CM | POA: Diagnosis not present

## 2023-03-13 DIAGNOSIS — I700 Atherosclerosis of aorta: Secondary | ICD-10-CM | POA: Diagnosis not present

## 2023-03-13 DIAGNOSIS — M549 Dorsalgia, unspecified: Secondary | ICD-10-CM

## 2023-03-13 DIAGNOSIS — I5030 Unspecified diastolic (congestive) heart failure: Secondary | ICD-10-CM | POA: Diagnosis not present

## 2023-03-13 DIAGNOSIS — Z992 Dependence on renal dialysis: Secondary | ICD-10-CM | POA: Diagnosis not present

## 2023-03-13 DIAGNOSIS — R072 Precordial pain: Secondary | ICD-10-CM | POA: Diagnosis not present

## 2023-03-13 LAB — CBC WITH DIFFERENTIAL/PLATELET
Basophils Absolute: 0.1 10*3/uL (ref 0.0–0.1)
Basophils Relative: 1.1 % (ref 0.0–3.0)
Eosinophils Absolute: 0.4 10*3/uL (ref 0.0–0.7)
Eosinophils Relative: 7 % — ABNORMAL HIGH (ref 0.0–5.0)
HCT: 37.4 % — ABNORMAL LOW (ref 39.0–52.0)
Hemoglobin: 12.7 g/dL — ABNORMAL LOW (ref 13.0–17.0)
Lymphocytes Relative: 40.2 % (ref 12.0–46.0)
Lymphs Abs: 2.4 10*3/uL (ref 0.7–4.0)
MCHC: 33.9 g/dL (ref 30.0–36.0)
MCV: 94.2 fl (ref 78.0–100.0)
Monocytes Absolute: 0.5 10*3/uL (ref 0.1–1.0)
Monocytes Relative: 7.5 % (ref 3.0–12.0)
Neutro Abs: 2.6 10*3/uL (ref 1.4–7.7)
Neutrophils Relative %: 44.2 % (ref 43.0–77.0)
Platelets: 258 10*3/uL (ref 150.0–400.0)
RBC: 3.97 Mil/uL — ABNORMAL LOW (ref 4.22–5.81)
RDW: 16.5 % — ABNORMAL HIGH (ref 11.5–15.5)
WBC: 6 10*3/uL (ref 4.0–10.5)

## 2023-03-13 LAB — COMPREHENSIVE METABOLIC PANEL
ALT: 15 U/L (ref 0–53)
AST: 15 U/L (ref 0–37)
Albumin: 4.6 g/dL (ref 3.5–5.2)
Alkaline Phosphatase: 197 U/L — ABNORMAL HIGH (ref 39–117)
BUN: 46 mg/dL — ABNORMAL HIGH (ref 6–23)
CO2: 28 mEq/L (ref 19–32)
Calcium: 9.6 mg/dL (ref 8.4–10.5)
Chloride: 93 mEq/L — ABNORMAL LOW (ref 96–112)
Creatinine, Ser: 11.82 mg/dL (ref 0.40–1.50)
GFR: 4.33 mL/min — CL (ref >60.00)
Glucose, Bld: 129 mg/dL — ABNORMAL HIGH (ref 70–99)
Potassium: 3.2 mEq/L — ABNORMAL LOW (ref 3.5–5.1)
Sodium: 135 mEq/L (ref 135–145)
Total Bilirubin: 0.6 mg/dL (ref 0.2–1.2)
Total Protein: 8.3 g/dL (ref 6.0–8.3)

## 2023-03-13 LAB — LIPASE: Lipase: 58 U/L (ref 11.0–59.0)

## 2023-03-13 MED ORDER — IOHEXOL 350 MG/ML SOLN
75.0000 mL | Freq: Once | INTRAVENOUS | Status: AC | PRN
  Administered 2023-03-13: 60 mL via INTRAVENOUS

## 2023-03-13 NOTE — Telephone Encounter (Signed)
Hope with [facility] lab called critical lab creatinine high at 11.82 and GFR low at  4.33 Sending note to Dr Ermalene Searing and [place] pool. Will let Lupita Leash CMA be aware. Results are in epic.

## 2023-03-13 NOTE — Assessment & Plan Note (Signed)
Recent history of precordial chest pain per cardiology note February 17, 2023.  No current chest pain

## 2023-03-13 NOTE — Assessment & Plan Note (Addendum)
Acute, sudden onset atypical upper back pain and high risk patient. EKG showed normal sinus rhythm, no ST changes, no Q waves, no LVH or arrhythmia. Differential includes cardiac source versus aortic dissection versus pulmonary embolus versus referred pain from liver/stomach/pancreas versus musculoskeletal strain. Given the severe sudden onset and atypical nature of the pain we will evaluate extensively.  EKG was unremarkable, will evaluate with labs including c-Met, CBC and lipase.  Will check chest x-ray for infiltrate versus widening of cardiac silhouette.  Also this will be in time to reevaluate a new focal opacity that was seen on chest x-ray 4 weeks ago. Considered checking D-dimer but given he is on dialysis so this would not likely be helpful. Will have a low threshold for further imaging such as CT angio.  He will use Tylenol for pain for now if workup negative consider muscle relaxant.  And stretching for possible muscle strain.  ER precautions provided

## 2023-03-13 NOTE — Telephone Encounter (Signed)
Patient is on dialysis 

## 2023-03-13 NOTE — Telephone Encounter (Signed)
I spoke with pt and pt said this morning about 5AM pt was washing hands and had sudden sharp mid back pain. No known injury; no heavy lifting. Pt is on dialysis. Pt has been resting in bed on heating pad but no relief of pain. Pain level now is 7.Pt is still having middle of back pain that hurts worse on right side of back under shoulder blade.No CP and no difficulty breathing. Pt is not having any weakness in extremities and pt is not having any difficulty in walking. Pt scheduled appt with Dr Ermalene Searing 03/13/23 at 11:40 with UC & ED precautions and pt voiced understanding.Sending note to Dr Ermalene Searing and [place] pool.

## 2023-03-13 NOTE — Patient Instructions (Addendum)
Please stop at the lab to have labs drawn. Go to the emergency room if severe chest pain, shortness of breath or increasing pain, new lightheadedness numbness or weakness.

## 2023-03-13 NOTE — Progress Notes (Signed)
Patient ID: Albert Eaton, male    DOB: 25-Aug-1966, 57 y.o.   MRN: [id number]  This visit was conducted in person.  BP 132/88 (BP Location: Right Arm, Patient Position: Sitting, Cuff Size: Large)   Pulse 71   Temp 97.8 F (36.6 C) (Temporal)   Ht 5\' 10"  (1.778 m)   Wt 203 lb 6 oz (92.3 kg)   SpO2 99%   BMI 29.18 kg/m    CC:  Chief Complaint  Patient presents with   Back Pain    See Phone Note    Subjective:   HPI: Albert Eaton is a 57 y.o. male patient of Audria Nine, NP with history of severe mitral regurg status post valve repair,CAD, OSA,  left renal cell carcinoma history  on dialysis from HTN, paroxysmal atrial fibrillation, gout, HFpEF presenting on 03/13/2023 for Back Pain (See Phone Note)  New onset sharp shooting mid/upper back pain this morning at 5 AM.  Pain 10 out of 10 while washing hands.  Pain continues but is slightly better  Pain is localized to the right side of back under shoulder blade. No radiation of pain. No known injury, no heavy lifting.  No chest pain, no shortness of breath.  No bilateral extremity weakness, no numbness. No difficulty walking.  Pin worse with bending forward.  Few nights ago had an episode of emesis, 5 o'clock yesterday was last episode of nausea/ emesis.  He states this is fairly common for him.  Has this issue off and on.  No fever.  Has treated with heating pad.    Hx of removal of left renal cell CA 2-3 years ago. He is on anticoagulation with Eliquis. No evidence of fluid overload.  Former smoker  cath in Nov 2023 demonstrated mod nonobstructive coronary artery disease  Reviewed office visit from cardiology PA April 23 for precordial pain.  Chest x-ray was performed And showed new focal opacity in the right costophrenic angle indeterminate, recommended follow-up chest x-ray in 4 to 6 weeks, he is scheduled for chest x-ray next week.   Relevant past medical, surgical, family and social history reviewed and updated as  indicated. Interim medical history since our last visit reviewed. Allergies and medications reviewed and updated. Outpatient Medications Prior to Visit  Medication Sig Dispense Refill   acetaminophen (ACETAMINOPHEN 8 HOUR) 650 MG CR tablet Take 1,300 mg by mouth every 8 (eight) hours as needed for pain.     amLODipine (NORVASC) 10 MG tablet Take 0.5 tablets (5 mg total) by mouth daily. 30 tablet 1   apixaban (ELIQUIS) 5 MG TABS tablet Take 1 tablet (5 mg total) by mouth 2 (two) times daily. 60 tablet 1   azelastine (ASTELIN) 0.1 % nasal spray Place 2 sprays into both nostrils 2 (two) times daily as needed for allergies. 30 mL 5   B Complex-C-Zn-Folic Acid (DIALYVITE 800-ZINC 15 PO) Take 1 tablet by mouth daily.     BIKTARVY 50-200-25 MG TABS tablet TAKE ONE TABLET BY MOUTH EVERY DAY 30 tablet 5   budesonide-formoterol (SYMBICORT) 160-4.5 MCG/ACT inhaler Inhale 2 puffs into the lungs in the morning and at bedtime. 10.2 g 5   calcitRIOL (ROCALTROL) 0.25 MCG capsule Take 7 capsules (1.75 mcg total) by mouth every Monday, Wednesday, and Friday with hemodialysis. 60 capsule 3   cetirizine (ZYRTEC) 10 MG tablet Take 1 tablet (10 mg total) by mouth daily. 30 tablet 5   diclofenac Sodium (VOLTAREN) 1 % GEL Apply 2 g topically  4 (four) times daily. Apply to chest pain when lidocaine patch not in place 150 g 0   Doxercalciferol (HECTOROL IV) as directed. Dialysis     famotidine (PEPCID) 20 MG tablet Take 1 tablet (20 mg total) by mouth 2 (two) times daily. 180 tablet 3   ferric citrate (AURYXIA) 1 GM 210 MG(Fe) tablet Take 420 mg by mouth See admin instructions. Take 420 mg by mouth three times a day with meals and 210 mg with each snack     fluticasone (FLOVENT HFA) 110 MCG/ACT inhaler Inhale 2 puffs into the lungs 2 (two) times daily as needed ("for flares").     gabapentin (NEURONTIN) 300 MG capsule Take 1 capsule (300 mg total) by mouth daily as needed (pain/neuropathy).     heparin sodium, porcine,  1000 UNIT/ML injection Inject 1,000 Units into the vein once.     ipratropium (ATROVENT HFA) 17 MCG/ACT inhaler Inhale into the lungs as needed for wheezing.     ipratropium (ATROVENT) 0.06 % nasal spray USE TWO SPRAYS IN EACH NOSTRIL two TO THREE times daily as needed 15 mL 5   lactulose (CHRONULAC) 10 GM/15ML solution Take 45 mLs (30 g total) by mouth daily as needed. Take 15-30 ml up to twice daily for constipation 1800 mL 11   levalbuterol (XOPENEX HFA) 45 MCG/ACT inhaler Inhale TWO puffs by MOUTH every FOUR (four) hours as needed FOR wheezing.] 15 g 1   levothyroxine (SYNTHROID) 25 MCG tablet take one tablet by MOUTH daily BEFORE breakfast 90 tablet 1   lubiprostone (AMITIZA) 24 MCG capsule Take 1 capsule (24 mcg total) by mouth 2 (two) times daily with a meal. NEEDS OFFICE VISIT FOR FURTHER REFILLS 60 capsule 11   Methoxy PEG-Epoetin Beta (MIRCERA IJ) Inject 1 each into the skin every 30 (thirty) days. Administered at dialysis center once a month     metoprolol tartrate (LOPRESSOR) 25 MG tablet Take 1 tablet (25 mg total) by mouth 2 (two) times daily. 90 tablet 1   Olopatadine HCl (PATADAY) 0.7 % SOLN Place 1 drop into both eyes 2 (two) times daily as needed (allergies). 2.5 mL 5   pantoprazole (PROTONIX) 40 MG tablet Take 40 mg by mouth daily.     rOPINIRole (REQUIP) 1 MG tablet TAKE ONE TABLET BY MOUTH AT BEDTIME 90 tablet 2   rosuvastatin (CRESTOR) 10 MG tablet TAKE ONE TABLET BY MOUTH DAILY 90 tablet 3   SENSIPAR 60 MG tablet Take 60 mg by mouth See admin instructions. Take 60 mg by mouth with supper after dialysis treatments on Mon/Wed/Fri     Spacer/Aero-Holding Chambers DEVI 1 Device by Does not apply route 2 (two) times a day. 1 each 1   Vitamin D, Ergocalciferol, (DRISDOL) 1.25 MG (50000 UNIT) CAPS capsule Take 1 capsule (50,000 Units total) by mouth every Tuesday. 12 capsule 0   No facility-administered medications prior to visit.     Per HPI unless specifically indicated in ROS  section below Review of Systems  Constitutional:  Negative for fatigue and fever.  HENT:  Negative for ear pain.   Eyes:  Negative for pain.  Respiratory:  Negative for cough and shortness of breath.   Cardiovascular:  Negative for chest pain, palpitations and leg swelling.  Gastrointestinal:  Negative for abdominal pain.  Genitourinary:  Negative for dysuria.  Musculoskeletal:  Negative for arthralgias.  Neurological:  Negative for syncope, light-headedness and headaches.  Psychiatric/Behavioral:  Negative for dysphoric mood.    Objective:  BP 132/88 (BP  Location: Right Arm, Patient Position: Sitting, Cuff Size: Large)   Pulse 71   Temp 97.8 F (36.6 C) (Temporal)   Ht 5\' 10"  (1.778 m)   Wt 203 lb 6 oz (92.3 kg)   SpO2 99%   BMI 29.18 kg/m   Wt Readings from Last 3 Encounters:  03/13/23 203 lb 6 oz (92.3 kg)  03/05/23 205 lb 4 oz (93.1 kg)  02/17/23 201 lb 3.2 oz (91.3 kg)      Physical Exam Constitutional:      Appearance: He is well-developed.  HENT:     Head: Normocephalic.     Right Ear: Hearing normal.     Left Ear: Hearing normal.     Nose: Nose normal.  Neck:     Thyroid: No thyroid mass or thyromegaly.     Vascular: No carotid bruit.     Trachea: Trachea normal.  Cardiovascular:     Rate and Rhythm: Normal rate and regular rhythm.     Pulses: Normal pulses.     Heart sounds: Heart sounds not distant. No murmur heard.    No friction rub. No gallop.     Comments: No peripheral edema  Dialysis access left upper arm Pulmonary:     Effort: Pulmonary effort is normal. No respiratory distress.     Breath sounds: Normal breath sounds. No decreased breath sounds.     Comments: Patient notes increased back pain with taking deep breaths during exam Chest:     Chest wall: No mass, swelling, tenderness or crepitus.  Breasts:    Right: Normal.     Left: Normal.  Musculoskeletal:     Cervical back: Normal.     Thoracic back: Normal. No tenderness or bony  tenderness. Normal range of motion.     Lumbar back: Normal.     Comments: Patient reports pain underneath right scapula but muscle and bony cervical areas are nontender to palpation, patient states pain is deeper Patient is very uncomfortable with taking shirt on and off  Skin:    General: Skin is warm and dry.     Findings: No rash.  Psychiatric:        Speech: Speech normal.        Behavior: Behavior normal.        Thought Content: Thought content normal.       Results for orders placed or performed in visit on 02/17/23  ECHOCARDIOGRAM LIMITED  Result Value Ref Range   Weight 3,219.2 oz   Height 70 in   BP 80/50 mmHg   S' Lateral 3.60 cm    Assessment and Plan  Upper back pain on right side Assessment & Plan: Acute, sudden onset atypical upper back pain and high risk patient. EKG showed normal sinus rhythm, no ST changes, no Q waves, no LVH or arrhythmia. Differential includes cardiac source versus aortic dissection versus pulmonary embolus versus referred pain from liver/stomach/pancreas versus musculoskeletal strain. Given the severe sudden onset and atypical nature of the pain we will evaluate extensively.  EKG was unremarkable, will evaluate with labs including c-Met, CBC and lipase.  Will check chest x-ray for infiltrate versus widening of cardiac silhouette.  Also this will be in time to reevaluate a new focal opacity that was seen on chest x-ray 4 weeks ago. Considered checking D-dimer but given he is on dialysis so this would not likely be helpful. Will have a low threshold for further imaging such as CT angio.  He will use Tylenol for pain  for now if workup negative consider muscle relaxant.  And stretching for possible muscle strain.  ER precautions provided  Orders: -     EKG 12-Lead -     DG Chest 2 View; Future -     Comprehensive metabolic panel -     Lipase -     CBC with Differential/Platelet  Precordial pain Assessment & Plan: Recent history of  precordial chest pain per cardiology note February 17, 2023.  No current chest pain   Dependence on renal dialysis (HCC)  NYHA class 2 heart failure with preserved ejection fraction (HCC) Assessment & Plan: Currently euvolemic.     No follow-ups on file.   Kerby Nora, MD

## 2023-03-13 NOTE — Assessment & Plan Note (Signed)
Currently euvolemic. 

## 2023-03-14 ENCOUNTER — Encounter: Payer: Self-pay | Admitting: Family Medicine

## 2023-03-16 DIAGNOSIS — N2581 Secondary hyperparathyroidism of renal origin: Secondary | ICD-10-CM | POA: Diagnosis not present

## 2023-03-16 DIAGNOSIS — Z992 Dependence on renal dialysis: Secondary | ICD-10-CM | POA: Diagnosis not present

## 2023-03-16 DIAGNOSIS — N186 End stage renal disease: Secondary | ICD-10-CM | POA: Diagnosis not present

## 2023-03-18 ENCOUNTER — Other Ambulatory Visit (HOSPITAL_COMMUNITY): Payer: Self-pay

## 2023-03-18 DIAGNOSIS — N2581 Secondary hyperparathyroidism of renal origin: Secondary | ICD-10-CM | POA: Diagnosis not present

## 2023-03-18 DIAGNOSIS — Z992 Dependence on renal dialysis: Secondary | ICD-10-CM | POA: Diagnosis not present

## 2023-03-18 DIAGNOSIS — N186 End stage renal disease: Secondary | ICD-10-CM | POA: Diagnosis not present

## 2023-03-20 DIAGNOSIS — N186 End stage renal disease: Secondary | ICD-10-CM | POA: Diagnosis not present

## 2023-03-20 DIAGNOSIS — N2581 Secondary hyperparathyroidism of renal origin: Secondary | ICD-10-CM | POA: Diagnosis not present

## 2023-03-20 DIAGNOSIS — Z992 Dependence on renal dialysis: Secondary | ICD-10-CM | POA: Diagnosis not present

## 2023-03-22 MED ORDER — AMLODIPINE BESYLATE 2.5 MG PO TABS: 2.5000 mg | ORAL_TABLET | Freq: Every day | ORAL | 3 refills | Status: DC

## 2023-03-22 NOTE — Addendum Note (Signed)
Addended byTereso Newcomer T on: 03/22/2023 09:13 PM   Modules accepted: Orders

## 2023-03-23 DIAGNOSIS — Z992 Dependence on renal dialysis: Secondary | ICD-10-CM | POA: Diagnosis not present

## 2023-03-23 DIAGNOSIS — N2581 Secondary hyperparathyroidism of renal origin: Secondary | ICD-10-CM | POA: Diagnosis not present

## 2023-03-23 DIAGNOSIS — N186 End stage renal disease: Secondary | ICD-10-CM | POA: Diagnosis not present

## 2023-03-25 DIAGNOSIS — Z992 Dependence on renal dialysis: Secondary | ICD-10-CM | POA: Diagnosis not present

## 2023-03-25 DIAGNOSIS — N186 End stage renal disease: Secondary | ICD-10-CM | POA: Diagnosis not present

## 2023-03-25 DIAGNOSIS — N2581 Secondary hyperparathyroidism of renal origin: Secondary | ICD-10-CM | POA: Diagnosis not present

## 2023-03-26 DIAGNOSIS — H35033 Hypertensive retinopathy, bilateral: Secondary | ICD-10-CM | POA: Diagnosis not present

## 2023-03-27 DIAGNOSIS — N186 End stage renal disease: Secondary | ICD-10-CM | POA: Diagnosis not present

## 2023-03-27 DIAGNOSIS — Z992 Dependence on renal dialysis: Secondary | ICD-10-CM | POA: Diagnosis not present

## 2023-03-27 DIAGNOSIS — N2581 Secondary hyperparathyroidism of renal origin: Secondary | ICD-10-CM | POA: Diagnosis not present

## 2023-03-27 DIAGNOSIS — I129 Hypertensive chronic kidney disease with stage 1 through stage 4 chronic kidney disease, or unspecified chronic kidney disease: Secondary | ICD-10-CM | POA: Diagnosis not present

## 2023-03-30 DIAGNOSIS — N186 End stage renal disease: Secondary | ICD-10-CM | POA: Diagnosis not present

## 2023-03-30 DIAGNOSIS — Z992 Dependence on renal dialysis: Secondary | ICD-10-CM | POA: Diagnosis not present

## 2023-03-30 DIAGNOSIS — N2581 Secondary hyperparathyroidism of renal origin: Secondary | ICD-10-CM | POA: Diagnosis not present

## 2023-04-01 ENCOUNTER — Other Ambulatory Visit: Payer: Self-pay | Admitting: Allergy & Immunology

## 2023-04-01 DIAGNOSIS — N2581 Secondary hyperparathyroidism of renal origin: Secondary | ICD-10-CM | POA: Diagnosis not present

## 2023-04-01 DIAGNOSIS — Z992 Dependence on renal dialysis: Secondary | ICD-10-CM | POA: Diagnosis not present

## 2023-04-01 DIAGNOSIS — N186 End stage renal disease: Secondary | ICD-10-CM | POA: Diagnosis not present

## 2023-04-03 DIAGNOSIS — Z992 Dependence on renal dialysis: Secondary | ICD-10-CM | POA: Diagnosis not present

## 2023-04-03 DIAGNOSIS — N2581 Secondary hyperparathyroidism of renal origin: Secondary | ICD-10-CM | POA: Diagnosis not present

## 2023-04-03 DIAGNOSIS — N186 End stage renal disease: Secondary | ICD-10-CM | POA: Diagnosis not present

## 2023-04-06 DIAGNOSIS — N186 End stage renal disease: Secondary | ICD-10-CM | POA: Diagnosis not present

## 2023-04-06 DIAGNOSIS — N2581 Secondary hyperparathyroidism of renal origin: Secondary | ICD-10-CM | POA: Diagnosis not present

## 2023-04-06 DIAGNOSIS — Z992 Dependence on renal dialysis: Secondary | ICD-10-CM | POA: Diagnosis not present

## 2023-04-07 DIAGNOSIS — Z992 Dependence on renal dialysis: Secondary | ICD-10-CM | POA: Diagnosis not present

## 2023-04-07 DIAGNOSIS — T82898A Other specified complication of vascular prosthetic devices, implants and grafts, initial encounter: Secondary | ICD-10-CM | POA: Diagnosis not present

## 2023-04-07 DIAGNOSIS — N186 End stage renal disease: Secondary | ICD-10-CM | POA: Diagnosis not present

## 2023-04-08 DIAGNOSIS — Z992 Dependence on renal dialysis: Secondary | ICD-10-CM | POA: Diagnosis not present

## 2023-04-08 DIAGNOSIS — N186 End stage renal disease: Secondary | ICD-10-CM | POA: Diagnosis not present

## 2023-04-08 DIAGNOSIS — N2581 Secondary hyperparathyroidism of renal origin: Secondary | ICD-10-CM | POA: Diagnosis not present

## 2023-04-10 DIAGNOSIS — N186 End stage renal disease: Secondary | ICD-10-CM | POA: Diagnosis not present

## 2023-04-10 DIAGNOSIS — Z992 Dependence on renal dialysis: Secondary | ICD-10-CM | POA: Diagnosis not present

## 2023-04-10 DIAGNOSIS — N2581 Secondary hyperparathyroidism of renal origin: Secondary | ICD-10-CM | POA: Diagnosis not present

## 2023-04-13 DIAGNOSIS — Z992 Dependence on renal dialysis: Secondary | ICD-10-CM | POA: Diagnosis not present

## 2023-04-13 DIAGNOSIS — N186 End stage renal disease: Secondary | ICD-10-CM | POA: Diagnosis not present

## 2023-04-13 DIAGNOSIS — N2581 Secondary hyperparathyroidism of renal origin: Secondary | ICD-10-CM | POA: Diagnosis not present

## 2023-04-14 DIAGNOSIS — I120 Hypertensive chronic kidney disease with stage 5 chronic kidney disease or end stage renal disease: Secondary | ICD-10-CM | POA: Diagnosis not present

## 2023-04-14 DIAGNOSIS — N186 End stage renal disease: Secondary | ICD-10-CM | POA: Diagnosis not present

## 2023-04-14 DIAGNOSIS — E782 Mixed hyperlipidemia: Secondary | ICD-10-CM | POA: Diagnosis not present

## 2023-04-14 DIAGNOSIS — I2510 Atherosclerotic heart disease of native coronary artery without angina pectoris: Secondary | ICD-10-CM | POA: Diagnosis not present

## 2023-04-14 DIAGNOSIS — Z992 Dependence on renal dialysis: Secondary | ICD-10-CM | POA: Diagnosis not present

## 2023-04-14 DIAGNOSIS — Z9889 Other specified postprocedural states: Secondary | ICD-10-CM | POA: Diagnosis not present

## 2023-04-15 DIAGNOSIS — Z992 Dependence on renal dialysis: Secondary | ICD-10-CM | POA: Diagnosis not present

## 2023-04-15 DIAGNOSIS — N186 End stage renal disease: Secondary | ICD-10-CM | POA: Diagnosis not present

## 2023-04-15 DIAGNOSIS — N2581 Secondary hyperparathyroidism of renal origin: Secondary | ICD-10-CM | POA: Diagnosis not present

## 2023-04-17 DIAGNOSIS — N2581 Secondary hyperparathyroidism of renal origin: Secondary | ICD-10-CM | POA: Diagnosis not present

## 2023-04-17 DIAGNOSIS — Z992 Dependence on renal dialysis: Secondary | ICD-10-CM | POA: Diagnosis not present

## 2023-04-17 DIAGNOSIS — N186 End stage renal disease: Secondary | ICD-10-CM | POA: Diagnosis not present

## 2023-04-20 ENCOUNTER — Other Ambulatory Visit: Payer: Self-pay | Admitting: Allergy & Immunology

## 2023-04-20 DIAGNOSIS — Z992 Dependence on renal dialysis: Secondary | ICD-10-CM | POA: Diagnosis not present

## 2023-04-20 DIAGNOSIS — N2581 Secondary hyperparathyroidism of renal origin: Secondary | ICD-10-CM | POA: Diagnosis not present

## 2023-04-20 DIAGNOSIS — N186 End stage renal disease: Secondary | ICD-10-CM | POA: Diagnosis not present

## 2023-04-22 DIAGNOSIS — Z992 Dependence on renal dialysis: Secondary | ICD-10-CM | POA: Diagnosis not present

## 2023-04-22 DIAGNOSIS — N2581 Secondary hyperparathyroidism of renal origin: Secondary | ICD-10-CM | POA: Diagnosis not present

## 2023-04-22 DIAGNOSIS — N186 End stage renal disease: Secondary | ICD-10-CM | POA: Diagnosis not present

## 2023-04-23 DIAGNOSIS — B20 Human immunodeficiency virus [HIV] disease: Secondary | ICD-10-CM | POA: Diagnosis not present

## 2023-04-23 DIAGNOSIS — N186 End stage renal disease: Secondary | ICD-10-CM | POA: Diagnosis not present

## 2023-04-23 DIAGNOSIS — B181 Chronic viral hepatitis B without delta-agent: Secondary | ICD-10-CM | POA: Diagnosis not present

## 2023-04-24 DIAGNOSIS — N2581 Secondary hyperparathyroidism of renal origin: Secondary | ICD-10-CM | POA: Diagnosis not present

## 2023-04-24 DIAGNOSIS — Z992 Dependence on renal dialysis: Secondary | ICD-10-CM | POA: Diagnosis not present

## 2023-04-24 DIAGNOSIS — N186 End stage renal disease: Secondary | ICD-10-CM | POA: Diagnosis not present

## 2023-04-26 DIAGNOSIS — N186 End stage renal disease: Secondary | ICD-10-CM | POA: Diagnosis not present

## 2023-04-26 DIAGNOSIS — I129 Hypertensive chronic kidney disease with stage 1 through stage 4 chronic kidney disease, or unspecified chronic kidney disease: Secondary | ICD-10-CM | POA: Diagnosis not present

## 2023-04-26 DIAGNOSIS — Z992 Dependence on renal dialysis: Secondary | ICD-10-CM | POA: Diagnosis not present

## 2023-04-27 DIAGNOSIS — N186 End stage renal disease: Secondary | ICD-10-CM | POA: Diagnosis not present

## 2023-04-27 DIAGNOSIS — N2581 Secondary hyperparathyroidism of renal origin: Secondary | ICD-10-CM | POA: Diagnosis not present

## 2023-04-27 DIAGNOSIS — Z992 Dependence on renal dialysis: Secondary | ICD-10-CM | POA: Diagnosis not present

## 2023-04-29 DIAGNOSIS — N186 End stage renal disease: Secondary | ICD-10-CM | POA: Diagnosis not present

## 2023-04-29 DIAGNOSIS — Z992 Dependence on renal dialysis: Secondary | ICD-10-CM | POA: Diagnosis not present

## 2023-04-29 DIAGNOSIS — N2581 Secondary hyperparathyroidism of renal origin: Secondary | ICD-10-CM | POA: Diagnosis not present

## 2023-05-01 DIAGNOSIS — N2581 Secondary hyperparathyroidism of renal origin: Secondary | ICD-10-CM | POA: Diagnosis not present

## 2023-05-01 DIAGNOSIS — N186 End stage renal disease: Secondary | ICD-10-CM | POA: Diagnosis not present

## 2023-05-01 DIAGNOSIS — Z992 Dependence on renal dialysis: Secondary | ICD-10-CM | POA: Diagnosis not present

## 2023-05-04 DIAGNOSIS — Z992 Dependence on renal dialysis: Secondary | ICD-10-CM | POA: Diagnosis not present

## 2023-05-04 DIAGNOSIS — N2581 Secondary hyperparathyroidism of renal origin: Secondary | ICD-10-CM | POA: Diagnosis not present

## 2023-05-04 DIAGNOSIS — N186 End stage renal disease: Secondary | ICD-10-CM | POA: Diagnosis not present

## 2023-05-04 NOTE — Progress Notes (Unsigned)
Cardiology Office Note:    Date:  05/05/2023  ID:  Albert Eaton, DOB 1966/08/03, MRN [id number] PCP: [name], NP  [facility] Providers Cardiologist:  Tonny Bollman, MD Electrophysiologist:  Lanier Prude, MD       Patient Profile:      Coronary artery disease, nonobstructive  LHC 09/11/2022: LAD proximal 50, mid 50; OM2 30; RCA proximal 40 HFmrEF (heart failure with mildly reduced ejection fraction)  TTE with bubble study 08/22/2022: EF 45-50, global HK, mild LVH, GR 2 DD, RV pressure and volume overload, mild reduced RVSF, moderately elevated PASP, RVSP 50, severe BAE, s/p MV repair with trivial MR, severe TR, RAP 15 Limited TTE 09/09/2022: EF 45-50, global HK, moderate LVH, moderate RVE, s/p MV repair with trivial MR, trivial TR Limited TTE 02/17/2023: No pericardial effusion, severe LAE, s/p mitral valve repair, moderate TR, ascending aorta 39 mm Paroxysmal atrial fibrillation  Amiodarone, Apixaban Mitral regurgitation S/p MV repair (min inv) 07/2022 (Dr. Leafy Ro) Tricuspid regurgitation Hx of PEA arrest  Occurred during dialysis 08/2022 - ?initial rhythm SVT vs AFib Monitor 09/2022: NSR, A-fib burden <1% (controlled rate and RVR), no high-grade AV block, few short ventricular runs  ESRD on hemodialysis (MWF) Hypertension  Hyperlipidemia  Aortic atherosclerosis Hypothyroidism  OSA  HIV Renal CA s/p L nephrectomy  Hep B Pre-CABG Dopplers 08/19/2022: Bilateral ICA with minimal wall thickening       History of Present Illness:   Albert Eaton is a 57 y.o. male who returns for follow-up of heart failure, valvular heart disease, CAD, A-fib.  He was last seen in 01/2023. He was hypotensive and had chest pain. A CXR was suspicious for pneumonia and he was tx with antibiotics. His BP meds were adjusted as well.  He is here alone.  He has not had further chest pain.  He does note dyspnea with exertion over the past month.  It seems to be getting worse.  He sleeps  on 2 pillows chronically.  He sometimes awakens coughing.  He has not had lower extremity edema.  His blood pressure drops significantly low during dialysis.  He notes that he has not gotten his dry weight in quite some time.  He has not had any productive cough or hemoptysis.  He has not had melena or hematochezia.  He has not had syncope but has been near syncopal on occasion.  He does not smoke.  ROS See the HPI    Studies Reviewed:   EKG Interpretation Date/Time:  Tuesday May 05 2023 09:51:21 EDT Ventricular Rate:  77 PR Interval:  186 QRS Duration:  96 QT Interval:  422 QTC Calculation: 477 R Axis:   10  Text Interpretation: Sinus rhythm with occasional Premature ventricular complexes Normal axis No ST-TW changes Similar to prior tracing Confirmed by [name] [id number]) on 05/05/2023 10:01:55 AM    Risk Assessment/Calculations:    CHA2DS2-VASc Score = 3   This indicates a 3.2% annual risk of stroke. The patient's score is based upon: CHF History: 1 HTN History: 1 Diabetes History: 0 Stroke History: 0 Vascular Disease History: 1 Age Score: 0 Gender Score: 0            Physical Exam:   VS:  BP 136/88   Pulse 89   Ht 5\' 10"  (1.778 m)   Wt 208 lb 3.2 oz (94.4 kg)   SpO2 98%   BMI 29.87 kg/m    Wt Readings from Last 3 Encounters:  05/05/23 208  lb 3.2 oz (94.4 kg)  03/13/23 203 lb 6 oz (92.3 kg)  03/05/23 205 lb 4 oz (93.1 kg)    Constitutional:      Appearance: Healthy appearance. Not in distress.  Neck:     Vascular: No JVR. JVD normal.  Pulmonary:     Breath sounds: Normal breath sounds. No wheezing. No rales.  Cardiovascular:     Normal rate. Regular rhythm. Normal S1. Normal S2.      Murmurs: There is a grade 1/6 systolic murmur at the LLSB.  Edema:    Peripheral edema absent.  Abdominal:     Palpations: Abdomen is soft.      ASSESSMENT AND PLAN:   SOB (shortness of breath) I suspect his shortness of breath is multifactorial.  However, he notes that  he has not gotten to dry weight in quite some time.  His dialysis is typically cut short due to low blood pressure.  I suspect his shortness of breath is mainly related to excess volume.  His oxygen saturation is normal on room air.  A CT scan in May did not demonstrate any pulmonary fibrosis.  He is no longer on amiodarone.  He had moderate nonobstructive disease on cardiac catheterization November 2023.  He has had recent echocardiogram with normally functioning mitral valve repair.  His EF has been stable at 45-50.  He has not had any chest discomfort.  He is currently in sinus rhythm today.  I will check a CBC today to rule out anemia.  He does note high heart rates during dialysis.  Now that he has been off of amiodarone for several months, I will repeat a heart monitor to see if recurrent arrhythmias correlate with his shortness of breath.  Obtain follow-up CBC today.  Otherwise, I have suggested that he discuss with nephrology whether or not to take something like Midodrine to help keep his blood pressure stable to allow for adequate dialysis.  I have also asked him to follow-up with pulmonology who previously was managing his CPAP for sleep apnea.  PAF (paroxysmal atrial fibrillation) (HCC) He is being evaluated for transplant.  He tells me that he will be denied transplant if he remains on anticoagulation.  I reviewed with Dr. Excell Seltzer.  His risk is somewhat high.  However, we would like to be able to get him off of anticoagulation if we can so that he can have a transplant.  As noted, repeat event monitor will be arranged.  After discussion with Dr. Excell Seltzer, we decided to have him follow-up with Dr. Lalla Brothers, whom he has seen in the past, to help with sorting out whether to recommend long-term anticoagulation.  For now, continue Eliquis 5 mg twice daily.  CAD (coronary artery disease) Mod nonobstructive CAD on cath in 08/2022.  He has not had further chest pain.  He is not on ASA as he is on Eliquis.  Continue Crestor 10 mg once daily.  Heart failure with mildly reduced ejection fraction (HFmrEF) (HCC) EF 45-50 by TTE in 08/2022. His volume is managed by dialysis. NYHA II. BP is too soft to consider adjustments in GDMT. Continue metoprolol 25 mg twice daily.   Severe mitral regurgitation S/p min invasive MV repair 07/2022. TTE 08/2022 with normal function. Continue SBE prophylaxis.   Essential hypertension Blood pressure overall stable on nondialysis days.  He does have significant drops in his blood pressure during dialysis.  He tells me that he cannot get the dry weight at most dialysis sessions.  As noted, I have encouraged him to follow-up with nephrology to see if he could potentially take Midodrine to keep his blood pressure up during dialysis.  Continue amlodipine 2.5 mg daily, metoprolol tartrate 25 mg twice daily.  Mixed hyperlipidemia Goal LDL is at least <70.  Obtain follow-up LFTs, lipids today.  Continue Crestor 10 mg daily.    Dispo:  Return in about 6 months (around 11/05/2023).  Signed, [name], PA-C

## 2023-05-05 ENCOUNTER — Encounter: Payer: Self-pay | Admitting: Physician Assistant

## 2023-05-05 ENCOUNTER — Ambulatory Visit: Payer: Medicare PPO | Attending: Physician Assistant | Admitting: Physician Assistant

## 2023-05-05 ENCOUNTER — Ambulatory Visit (INDEPENDENT_AMBULATORY_CARE_PROVIDER_SITE_OTHER): Payer: Medicare PPO

## 2023-05-05 VITALS — BP 136/88 | HR 89 | Ht 70.0 in | Wt 208.2 lb

## 2023-05-05 DIAGNOSIS — I10 Essential (primary) hypertension: Secondary | ICD-10-CM

## 2023-05-05 DIAGNOSIS — I5022 Chronic systolic (congestive) heart failure: Secondary | ICD-10-CM

## 2023-05-05 DIAGNOSIS — I340 Nonrheumatic mitral (valve) insufficiency: Secondary | ICD-10-CM

## 2023-05-05 DIAGNOSIS — I2510 Atherosclerotic heart disease of native coronary artery without angina pectoris: Secondary | ICD-10-CM

## 2023-05-05 DIAGNOSIS — R0602 Shortness of breath: Secondary | ICD-10-CM | POA: Diagnosis not present

## 2023-05-05 DIAGNOSIS — E782 Mixed hyperlipidemia: Secondary | ICD-10-CM

## 2023-05-05 DIAGNOSIS — I480 Paroxysmal atrial fibrillation: Secondary | ICD-10-CM

## 2023-05-05 LAB — CBC
Hemoglobin: 11.9 g/dL — ABNORMAL LOW (ref 13.0–17.7)
MCHC: 34.2 g/dL (ref 31.5–35.7)
Platelets: 285 10*3/uL (ref 150–450)
RBC: 3.62 x10E6/uL — ABNORMAL LOW (ref 4.14–5.80)
RDW: 16.2 % — ABNORMAL HIGH (ref 11.6–15.4)
WBC: 5.1 10*3/uL (ref 3.4–10.8)

## 2023-05-05 LAB — LIPID PANEL

## 2023-05-05 LAB — HEPATIC FUNCTION PANEL

## 2023-05-05 NOTE — Assessment & Plan Note (Addendum)
I suspect his shortness of breath is multifactorial.  However, he notes that he has not gotten to dry weight in quite some time.  His dialysis is typically cut short due to low blood pressure.  I suspect his shortness of breath is mainly related to excess volume.  His oxygen saturation is normal on room air.  A CT scan in May did not demonstrate any pulmonary fibrosis.  He is no longer on amiodarone.  He had moderate nonobstructive disease on cardiac catheterization November 2023.  He has had recent echocardiogram with normally functioning mitral valve repair.  His EF has been stable at 45-50.  He has not had any chest discomfort.  He is currently in sinus rhythm today.  I will check a CBC today to rule out anemia.  He does note high heart rates during dialysis.  Now that he has been off of amiodarone for several months, I will repeat a heart monitor to see if recurrent arrhythmias correlate with his shortness of breath.  Obtain follow-up CBC today.  Otherwise, I have suggested that he discuss with nephrology whether or not to take something like Midodrine to help keep his blood pressure stable to allow for adequate dialysis.  I have also asked him to follow-up with pulmonology who previously was managing his CPAP for sleep apnea.

## 2023-05-05 NOTE — Progress Notes (Unsigned)
Enrolled patient for a 14 day Zio XT monitor to be mailed to patients home   Excell Seltzer to read

## 2023-05-05 NOTE — Assessment & Plan Note (Signed)
Mod nonobstructive CAD on cath in 08/2022.  He has not had further chest pain.  He is not on ASA as he is on Eliquis. Continue Crestor 10 mg once daily.

## 2023-05-05 NOTE — Patient Instructions (Addendum)
Medication Instructions:  Your physician recommends that you continue on your current medications as directed. Please refer to the Current Medication list given to you today.  *If you need a refill on your cardiac medications before your next appointment, please call your pharmacy*   Lab Work: LFT, Lipids, CBC today.  If you have labs (blood work) drawn today and your tests are completely normal, you will receive your results only by: MyChart Message (if you have MyChart) OR A paper copy in the mail If you have any lab test that is abnormal or we need to change your treatment, we will call you to review the results.   Testing/Procedures: Albert Eaton- Long Term Monitor Instructions  Your physician has requested you wear a ZIO patch monitor for 14 days.  This is a single patch monitor. [employer] supplies one patch monitor per enrollment. Additional stickers are not available. Please do not apply patch if you will be having a Nuclear Stress Test,  Echocardiogram, Cardiac CT, MRI, or Chest Xray during the period you would be wearing the  monitor. The patch cannot be worn during these tests. You cannot remove and re-apply the  ZIO XT patch monitor.  Your ZIO patch monitor will be mailed 3 day USPS to your address on file. It may take 3-5 days  to receive your monitor after you have been enrolled.  Once you have received your monitor, please review the enclosed instructions. Your monitor  has already been registered assigning a specific monitor serial # to you.  Billing and Patient Assistance Program Information  We have supplied [employer] with any of your insurance information on file for billing purposes. [employer] offers a sliding scale Patient Assistance Program for patients that do not have  insurance, or whose insurance does not completely cover the cost of the ZIO monitor.  You must apply for the Patient Assistance Program to qualify for this discounted rate.  To apply, please call [employer]  at [phone], select option 4, select option 2, ask to apply for  Patient Assistance Program. Albert Eaton will ask your household income, and how many people  are in your household. They will quote your out-of-pocket cost based on that information.  [employer] will also be able to set up a 94-month, interest-free payment plan if needed.  Applying the monitor   Shave hair from upper left chest.  Hold abrader disc by orange tab. Rub abrader in 40 strokes over the upper left chest as  indicated in your monitor instructions.  Clean area with 4 enclosed alcohol pads. Let dry.  Apply patch as indicated in monitor instructions. Patch will be placed under collarbone on left  side of chest with arrow pointing upward.  Rub patch adhesive wings for 2 minutes. Remove white label marked "1". Remove the white  label marked "2". Rub patch adhesive wings for 2 additional minutes.  While looking in a mirror, press and release button in center of patch. A small green light will  flash 3-4 times. This will be your only indicator that the monitor has been turned on.  Do not shower for the first 24 hours. You may shower after the first 24 hours.  Press the button if you feel a symptom. You will hear a small click. Record Date, Time and  Symptom in the Patient Logbook.  When you are ready to remove the patch, follow instructions on the last 2 pages of Patient  Logbook. Stick patch monitor onto the last page of Patient Logbook.  Place Patient Logbook in the blue and white box. Use locking tab on box and tape box closed  securely. The blue and white box has prepaid postage on it. Please place it in the mailbox as  soon as possible. Your physician should have your test results approximately 7 days after the  monitor has been mailed back to [facility].  Call [facility] Customer Care at [phone] if you have questions regarding  your ZIO XT patch monitor. Call them immediately if you see an orange light  blinking on your  monitor.  If your monitor falls off in less than 4 days, contact our Monitor department at [phone].  If your monitor becomes loose or falls off after 4 days call [employer] at [phone] for  suggestions on securing your monitor    Follow-Up: At [facility], you and your health needs are our priority.  As part of our continuing mission to provide you with exceptional heart care, we have created designated Provider Care Teams.  These Care Teams include your primary Cardiologist (physician) and Advanced Practice Providers (APPs -  Physician Assistants and Nurse Practitioners) who all work together to provide you with the care you need, when you need it.  We recommend signing up for the patient portal called "MyChart".  Sign up information is provided on this After Visit Summary.  MyChart is used to connect with patients for Virtual Visits (Telemedicine).  Patients are able to view lab/test results, encounter notes, upcoming appointments, etc.  Non-urgent messages can be sent to your provider as well.   To learn more about what you can do with MyChart, go to [email or web address].    Your next appointment:   6 month(s)  Provider:   Tonny Bollman, MD

## 2023-05-05 NOTE — Assessment & Plan Note (Signed)
Blood pressure overall stable on nondialysis days.  He does have significant drops in his blood pressure during dialysis.  He tells me that he cannot get the dry weight at most dialysis sessions.  As noted, I have encouraged him to follow-up with nephrology to see if he could potentially take Midodrine to keep his blood pressure up during dialysis.  Continue amlodipine 2.5 mg daily, metoprolol tartrate 25 mg twice daily.

## 2023-05-05 NOTE — Assessment & Plan Note (Signed)
S/p min invasive MV repair 07/2022. TTE 08/2022 with normal function. Continue SBE prophylaxis.  

## 2023-05-05 NOTE — Assessment & Plan Note (Signed)
EF 45-50 by TTE in 08/2022. His volume is managed by dialysis. NYHA II. BP is too soft to consider adjustments in GDMT. Continue metoprolol 25 mg twice daily.  

## 2023-05-05 NOTE — Assessment & Plan Note (Signed)
Goal LDL is at least <70.  Obtain follow-up LFTs, lipids today.  Continue Crestor 10 mg daily.

## 2023-05-05 NOTE — Assessment & Plan Note (Signed)
He is being evaluated for transplant.  He tells me that he will be denied transplant if he remains on anticoagulation.  I reviewed with Dr. Excell Seltzer.  His risk is somewhat high.  However, we would like to be able to get him off of anticoagulation if we can so that he can have a transplant.  As noted, repeat event monitor will be arranged.  After discussion with Dr. Excell Seltzer, we decided to have him follow-up with Dr. Lalla Brothers, whom he has seen in the past, to help with sorting out whether to recommend long-term anticoagulation.  For now, continue Eliquis 5 mg twice daily.

## 2023-05-06 ENCOUNTER — Telehealth: Payer: Self-pay | Admitting: *Deleted

## 2023-05-06 DIAGNOSIS — N2581 Secondary hyperparathyroidism of renal origin: Secondary | ICD-10-CM | POA: Diagnosis not present

## 2023-05-06 DIAGNOSIS — Z7682 Awaiting organ transplant status: Secondary | ICD-10-CM | POA: Diagnosis not present

## 2023-05-06 DIAGNOSIS — Z992 Dependence on renal dialysis: Secondary | ICD-10-CM | POA: Diagnosis not present

## 2023-05-06 DIAGNOSIS — N186 End stage renal disease: Secondary | ICD-10-CM | POA: Diagnosis not present

## 2023-05-06 LAB — LIPID PANEL
Chol/HDL Ratio: 4 ratio (ref 0.0–5.0)
HDL: 38 mg/dL — ABNORMAL LOW (ref >39)
VLDL Cholesterol Cal: 33 mg/dL (ref 5–40)

## 2023-05-06 LAB — CBC
Hematocrit: 34.8 % — ABNORMAL LOW (ref 37.5–51.0)
MCH: 32.9 pg (ref 26.6–33.0)
MCV: 96 fL (ref 79–97)

## 2023-05-06 LAB — HEPATIC FUNCTION PANEL
ALT: 15 IU/L (ref 0–44)
Albumin: 4.8 g/dL (ref 3.8–4.9)

## 2023-05-06 NOTE — Telephone Encounter (Signed)
Call placed to pt regarding message below, left a message for pt to call back.  °

## 2023-05-06 NOTE — Telephone Encounter (Signed)
-----   Message from Beatrice Lecher, [place] sent at 05/05/2023  6:23 PM EDT ----- Please arrange follow up OV with Dr. Lalla Brothers after his monitor is done. Can you let him know that I reviewed with Dr. Excell Seltzer and we want him to see Dr. Lalla Brothers after the monitor to help [place] decide if we need to keep him on anticoagulation? Thanks [name], PA-C    05/05/2023 6:23 PM

## 2023-05-06 NOTE — Telephone Encounter (Signed)
Patient returned call

## 2023-05-06 NOTE — Telephone Encounter (Signed)
Pt has been made aware of the message below and he actually has already got a message from the scheduler and after his Dialysis, he will call them back to get that scheduled.

## 2023-05-07 ENCOUNTER — Telehealth: Payer: Self-pay | Admitting: *Deleted

## 2023-05-07 DIAGNOSIS — Z79899 Other long term (current) drug therapy: Secondary | ICD-10-CM

## 2023-05-07 MED ORDER — ROSUVASTATIN CALCIUM 20 MG PO TABS: 20.0000 mg | ORAL_TABLET | Freq: Every day | ORAL | 3 refills | Status: DC

## 2023-05-07 NOTE — Telephone Encounter (Signed)
-----   Message from [name] sent at 05/06/2023 10:07 PM EDT ----- Results sent to [facility] via MyChart. See MyChart comments below. I will send a copy to [name], NP as Lorain Childes (to follow up on elevated ALP) PLAN:  -Increase Crestor to 20 mg once daily  -Lipids, ALT in 3 mos  Mr. Stengel  Your LDL is above goal. We should get it to at least less than 70. Your triglycerides are somewhat elevated. Your AST/ALT (livery enzymes) are normal. The alkaline phosphatase enzyme (liver, bone) is elevated. This is likely related to dialysis. However, you should follow up with your primary care provider to make sure about this. Your hemoglobin (blood count) has decreased some but seems to be overall stable. I will increase your Crestor to 20 mg once daily to help your cholesterol. I will recheck labs in 3 mos to keep an eye on your cholesterol.  [name], PA-C

## 2023-05-08 DIAGNOSIS — N2581 Secondary hyperparathyroidism of renal origin: Secondary | ICD-10-CM | POA: Diagnosis not present

## 2023-05-08 DIAGNOSIS — Z992 Dependence on renal dialysis: Secondary | ICD-10-CM | POA: Diagnosis not present

## 2023-05-08 DIAGNOSIS — N186 End stage renal disease: Secondary | ICD-10-CM | POA: Diagnosis not present

## 2023-05-10 DIAGNOSIS — I2510 Atherosclerotic heart disease of native coronary artery without angina pectoris: Secondary | ICD-10-CM

## 2023-05-10 DIAGNOSIS — I340 Nonrheumatic mitral (valve) insufficiency: Secondary | ICD-10-CM

## 2023-05-10 DIAGNOSIS — I480 Paroxysmal atrial fibrillation: Secondary | ICD-10-CM | POA: Diagnosis not present

## 2023-05-10 DIAGNOSIS — I5022 Chronic systolic (congestive) heart failure: Secondary | ICD-10-CM

## 2023-05-11 DIAGNOSIS — Z992 Dependence on renal dialysis: Secondary | ICD-10-CM | POA: Diagnosis not present

## 2023-05-11 DIAGNOSIS — N2581 Secondary hyperparathyroidism of renal origin: Secondary | ICD-10-CM | POA: Diagnosis not present

## 2023-05-11 DIAGNOSIS — N186 End stage renal disease: Secondary | ICD-10-CM | POA: Diagnosis not present

## 2023-05-12 ENCOUNTER — Encounter: Payer: Self-pay | Admitting: Pulmonary Disease

## 2023-05-13 ENCOUNTER — Other Ambulatory Visit: Payer: Self-pay | Admitting: Cardiovascular Disease

## 2023-05-14 ENCOUNTER — Ambulatory Visit (INDEPENDENT_AMBULATORY_CARE_PROVIDER_SITE_OTHER): Payer: Medicare PPO | Admitting: Allergy & Immunology

## 2023-05-14 ENCOUNTER — Other Ambulatory Visit: Payer: Self-pay

## 2023-05-14 ENCOUNTER — Encounter: Payer: Self-pay | Admitting: Allergy & Immunology

## 2023-05-14 VITALS — BP 140/80 | HR 70 | Temp 97.6°F | Resp 16 | Wt 211.5 lb

## 2023-05-14 DIAGNOSIS — J4530 Mild persistent asthma, uncomplicated: Secondary | ICD-10-CM

## 2023-05-14 DIAGNOSIS — J3089 Other allergic rhinitis: Secondary | ICD-10-CM

## 2023-05-14 NOTE — Progress Notes (Signed)
FOLLOW UP  Date of Service/Encounter:  05/14/23   Assessment:   Mild persistent asthma, uncomplicated - with better control with daily ICS use (we do have room to increase if needed in the future)   Perennial allergic rhinitis (dust mites)   HIV - on HAART   ESRD - on transplant list    Dysgeusia - unknown cause (cleared by ENT)  Ear fullness - with normal exam today  Plan/Recommendations:   1. Mild persistent asthma, uncomplicated - Lung testing looks stable today.  - Daily controller medication(s): Symbicort 160/4.93mcg two puffs one to two times with spacer - Prior to physical activity: albuterol 2 puffs 10-15 minutes before physical activity. - Rescue medications: albuterol 4 puffs every 4-6 hours as needed - Changes during respiratory infections or worsening symptoms: INCREASE Symbicort to two puffs TWICE DAILY for ONE TO TWO WEEKS. - Asthma control goals:  * Full participation in all desired activities (may need albuterol before activity) * Albuterol use two time or less a week on average (not counting use with activity) * Cough interfering with sleep two time or less a month * Oral steroids no more than once a year * No hospitalizations  2. Chronic allergy conjunctivitis of both eyes - Continue with Pataday one drop per eye twice daily. - Continue with Systane eye drops as needed. - Continue with azelastine as needed. - Continue with ipratropium as needed.  - Continue with Zyrtec (cetirizine) daily instead.  3. Ear fullness - There is minimal, if any, ear wax present today. - I will let Dr. Jenne Pane know what is going on so this is on his radar.  - Definitely keep calling him to get the [facility] device placed.   4. Return in about 6 months (around 11/14/2023).    Subjective:   Albert Eaton is a 57 y.o. male presenting today for follow up of  Chief Complaint  Patient presents with   Follow-up    Ears hurt and feel like they need to be cleaned. No refills  needed at this time per patient.  Random vomiting that started a couple months ago. Cause unknown.    Albert Eaton has a history of the following: Patient Active Problem List   Diagnosis Date Noted   Precordial pain 02/17/2023   Heart failure with mildly reduced ejection fraction (HFmrEF) (HCC) 02/17/2023   Hypotension 02/17/2023   Ascending aorta dilation (HCC) 02/17/2023   CAD (coronary artery disease) 02/16/2023   Chronic viral hepatitis B without delta-agent (HCC) 01/06/2023   Cancer screening 01/06/2023   Vitamin D deficiency 01/01/2023   Acute cough 11/25/2022   Influenza A 11/25/2022   Other headache syndrome 11/25/2022   Multiple fractures of ribs, bilateral, initial encounter for closed fracture 09/16/2022   Cardiac arrest (HCC) 09/08/2022   Acute cerebrovascular insufficiency 08/28/2022   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 08/28/2022   Cerebrovascular accident (CVA) due to embolism of left cerebellar artery (HCC)    S/P MVR (mitral valve repair) 08/21/2022   Chronic pain of right ankle 06/26/2022   Autonomic neuropathy in diseases classified elsewhere 04/21/2022   PAF (paroxysmal atrial fibrillation) (HCC) 12/02/2021   Laryngopharyngeal reflux (LPR) 11/11/2021   Chest wall pain    Elevated troponin    NSTEMI (non-ST elevated myocardial infarction) (HCC) 09/03/2021   Essential hypertension 09/03/2021   Renal cell carcinoma (HCC) 06/27/2021   Generalized (acute) peritonitis (HCC) 06/24/2021   Other disorders of bilirubin metabolism 06/24/2021   Renal mass  02/18/2021   Pain, unspecified 02/08/2021   SVT (supraventricular tachycardia) 01/18/2021   Lower abdominal pain 06/26/2020   Contact with and (suspected) exposure to tuberculosis 04/02/2020   Fluid overload, unspecified 04/02/2020   Pulsatile tinnitus of right ear 11/21/2019   Kidney failure 11/21/2019   Acute diastolic CHF (congestive heart failure) (HCC) 11/21/2019   Fever and chills  11/07/2019   Hypercalcemia 10/17/2019   Dependence on renal dialysis (HCC) 06/06/2019   Coagulation defect, unspecified (HCC) 05/23/2019   Iron deficiency anemia, unspecified 05/16/2019   Anemia in other chronic diseases classified elsewhere 05/13/2019   Arteriovenous fistula, acquired (HCC) 05/13/2019   Body mass index (BMI) 28.0-28.9, adult 05/13/2019   Crohn's disease of small intestine without complications (HCC) 05/13/2019   Encounter for general adult medical examination with abnormal findings 05/13/2019   Gout, unspecified 05/13/2019   Nausea 05/13/2019   Other fatigue 05/13/2019   Secondary hyperparathyroidism of renal origin (HCC) 05/13/2019   ESRD (end stage renal disease) on dialysis (HCC) 05/13/2019   Chronic diastolic (congestive) heart failure (HCC) 05/13/2019   Chronic kidney disease, unspecified 05/13/2019   Obstructive sleep apnea 05/13/2019   Discomfort of right ear 02/18/2019   Hypokalemia 06/13/2018   Abnormal CT scan, small bowel    Perennial allergic rhinitis with a predominant nonallergic component 02/09/2018   Allergic conjunctivitis 02/09/2018   Mild intermittent asthma 02/09/2018   Atherosclerosis 12/09/2017   Hypothyroidism 11/19/2017   Mixed hyperlipidemia 10/13/2017   Personal history of gout 10/13/2017   S/P cholecystectomy 10/13/2017   Congestive heart failure with LV diastolic dysfunction, NYHA class 1 (HCC) 10/13/2017   Hypothyroidism (acquired) 10/13/2017   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/31/2017   SOB (shortness of breath) 06/07/2017   Arthritis, gouty 01/27/2017   Chronic diastolic CHF (congestive heart failure) (HCC) 01/06/2017   OSA (obstructive sleep apnea) 01/06/2017   Abnormal electrocardiogram (ECG) (EKG) 10/31/2016   Pleuritic chest pain 10/09/2016   HIV disease (HCC) 10/09/2016   Hypertensive heart disease with CHF (congestive heart failure) (HCC) 10/09/2016   Upper back pain on right side 05/29/2015   NYHA class 2 heart failure with  preserved ejection fraction (HCC) 05/15/2015   PAH (pulmonary artery hypertension) (HCC) 05/08/2015   Severe mitral regurgitation 05/04/2015   Prolonged Q-T interval on ECG 05/04/2015    History obtained from: chart review and patient.  [place] is a 57 y.o. male presenting for a follow up visit.  He was last seen in January 2024.  At that time, lung testing was not done.  We increased his Symbicort to 2 puffs twice daily for now to control his cough during the day.  We continue with albuterol as needed.  For his rhinitis, we continue with Systane, Pataday, Astelin, and ipratropium.  We stopped Clarinex and started Zyrtec instead.  We obtained blood work that was negative.  Since last visit, he has done fairly well.  Asthma/Respiratory Symptom History: He reamins on the two puffs of the Symbicort once daily. This is enogh to control his symptoms. He ha snot been using his rescue inhaler at night. He is not having any nighttime coughing at all. He is having some coughing at night which he attributes to OSA. He has a CPAP but he is not great about using it consistently because he feels that he is going to suffocate. He was planning to do the [place].  He tried to call and make an appointment with ENT for this. He had the heart surgery and this put the  Inspire replacement off. They replaced his mitral valve. He had an MI a week or so afterwards.  I reviewed the notes and it looks like he had cardiac arrest secondary to some electrolyte abnormalities.  Then he had a "mini stroke" in the hospital. He did have residual losses. He had this in November 2023.  He has recovered from this.  Allergic Rhinitis Symptom History: Environmental allergies are under fair control at this time. He has not had a ton of issues this year. It was once in the beginning of the pollen season, but otherwise he has done very well.  He does use his Systane eyedrops as well as Pataday.  He has Astelin and ipratropium that he uses as  needed.  He also remains on Zyrtec daily.  He does report some ear fullness today.  He is wondering if he has earwax in the ear.  He will talk to Dr. Jenne Pane about it when he sees him for his next visit regarding the Inspire device placement.  Otherwise, there have been no changes to his past medical history, surgical history, family history, or social history.    Review of systems otherwise negative other than that mentioned in the HPI.    Objective:   Blood pressure (!) 140/80, pulse 70, temperature 97.6 F (36.4 C), temperature source Temporal, resp. rate 16, weight 211 lb 8 oz (95.9 kg), SpO2 97%. Body mass index is 30.35 kg/m.    Physical Exam Vitals reviewed.  Constitutional:      Appearance: He is well-developed.     Comments: Very pleasant.  HENT:     Head: Normocephalic and atraumatic.     Right Ear: Tympanic membrane, ear canal and external ear normal.     Left Ear: Tympanic membrane, ear canal and external ear normal.     Nose: No nasal deformity, septal deviation, mucosal edema or rhinorrhea.     Right Turbinates: Enlarged, swollen and pale.     Left Turbinates: Enlarged, swollen and pale.     Right Sinus: No maxillary sinus tenderness or frontal sinus tenderness.     Left Sinus: No maxillary sinus tenderness or frontal sinus tenderness.     Comments: No nasal polyps.    Mouth/Throat:     Lips: Pink.     Mouth: Mucous membranes are moist. Mucous membranes are not pale and not dry.     Pharynx: Uvula midline.     Comments: Moderate cobblestoning. Eyes:     General: Lids are normal. Allergic shiner present.        Right eye: No discharge.        Left eye: No discharge.     Conjunctiva/sclera: Conjunctivae normal.     Right eye: Right conjunctiva is not injected. No chemosis.    Left eye: Left conjunctiva is not injected. No chemosis.    Pupils: Pupils are equal, round, and reactive to light.  Cardiovascular:     Rate and Rhythm: Normal rate and regular rhythm.      Heart sounds: Normal heart sounds.  Pulmonary:     Effort: Pulmonary effort is normal. No tachypnea, accessory muscle usage or respiratory distress.     Breath sounds: No decreased breath sounds, wheezing, rhonchi or rales.     Comments: Moving air well in all lung fields.  No increased work of breathing. Chest:     Chest wall: No tenderness.     Comments: Chest pain to palpation. Lymphadenopathy:     Cervical: No cervical adenopathy.  Skin:    General: Skin is warm.     Capillary Refill: Capillary refill takes less than 2 seconds.     Coloration: Skin is not pale.     Findings: No abrasion, erythema, petechiae or rash. Rash is not papular, urticarial or vesicular.     Comments: Fistula in the left upper arm.  Neurological:     Mental Status: He is alert.  Psychiatric:        Behavior: Behavior is cooperative.      Diagnostic studies:    Spirometry: results normal (FEV1: 2.27/72%, FVC: 2.83/70%, FEV1/FVC: 80%).    Spirometry consistent with normal pattern.    Allergy Studies: none        Malachi Bonds, MD  [facility]

## 2023-05-14 NOTE — Patient Instructions (Addendum)
1. Mild persistent asthma, uncomplicated - Lung testing looks stable today.  - Daily controller medication(s): Symbicort 160/4.4mcg two puffs one to two times with spacer - Prior to physical activity: albuterol 2 puffs 10-15 minutes before physical activity. - Rescue medications: albuterol 4 puffs every 4-6 hours as needed - Changes during respiratory infections or worsening symptoms: INCREASE Symbicort to two puffs TWICE DAILY for ONE TO TWO WEEKS. - Asthma control goals:  * Full participation in all desired activities (may need albuterol before activity) * Albuterol use two time or less a week on average (not counting use with activity) * Cough interfering with sleep two time or less a month * Oral steroids no more than once a year * No hospitalizations  2. Chronic allergy conjunctivitis of both eyes - Continue with Pataday one drop per eye twice daily. - Continue with Systane eye drops as needed. - Continue with azelastine as needed. - Continue with ipratropium as needed.  - Continue with Zyrtec (cetirizine) daily instead.  3. Ear fullness - There is minimal, if any, ear wax present today. - I will let Dr. Jenne Pane know what is going on so this is on his radar.  - Definitely keep calling him to get the [facility] device placed.   4. Return in about 6 months (around 11/14/2023).    Please inform us of any Emergency Department visits, hospitalizations, or changes in symptoms. Call us before going to the ED for breathing or allergy symptoms since we might be able to fit you in for a sick visit. Feel free to contact us anytime with any questions, problems, or concerns.  It was a pleasure to see you again today!  Websites that have reliable patient information: 1. American Academy of Asthma, Allergy, and Immunology: www.aaaai.org 2. Food Allergy Research and Education (FARE): foodallergy.org 3. Mothers of Asthmatics: http://www.asthmacommunitynetwork.org 4. American College of Allergy,  Asthma, and Immunology: www.acaai.org   COVID-19 Vaccine Information can be found at: [email or web address].[email or web address] For questions related to vaccine distribution or appointments, please email [email or web address] or call [phone].     "Like" [place] on Facebook and Instagram for our latest updates!       Make sure you are registered to vote! If you have moved or changed any of your contact information, you will need to get this updated before voting!  In some cases, you MAY be able to register to vote online: [email or web address]

## 2023-05-15 DIAGNOSIS — N186 End stage renal disease: Secondary | ICD-10-CM | POA: Diagnosis not present

## 2023-05-15 DIAGNOSIS — N2581 Secondary hyperparathyroidism of renal origin: Secondary | ICD-10-CM | POA: Diagnosis not present

## 2023-05-15 DIAGNOSIS — Z992 Dependence on renal dialysis: Secondary | ICD-10-CM | POA: Diagnosis not present

## 2023-05-18 DIAGNOSIS — Z992 Dependence on renal dialysis: Secondary | ICD-10-CM | POA: Diagnosis not present

## 2023-05-18 DIAGNOSIS — N2581 Secondary hyperparathyroidism of renal origin: Secondary | ICD-10-CM | POA: Diagnosis not present

## 2023-05-18 DIAGNOSIS — N186 End stage renal disease: Secondary | ICD-10-CM | POA: Diagnosis not present

## 2023-05-19 ENCOUNTER — Other Ambulatory Visit: Payer: Self-pay

## 2023-05-19 DIAGNOSIS — I480 Paroxysmal atrial fibrillation: Secondary | ICD-10-CM

## 2023-05-19 MED ORDER — APIXABAN 5 MG PO TABS
5.0000 mg | ORAL_TABLET | Freq: Two times a day (BID) | ORAL | 2 refills | Status: AC
Start: 2023-05-19 — End: 2023-08-17

## 2023-05-19 NOTE — Telephone Encounter (Signed)
Prescription refill request for Eliquis received. Indication: Afib  Last office visit:05/05/23 Alben Spittle)  Scr: 11.82 (03/13/23)  Age: 57 Weight: 95.9kg  Appropriate dose. Refill sent.

## 2023-05-20 ENCOUNTER — Other Ambulatory Visit: Payer: Self-pay | Admitting: Allergy & Immunology

## 2023-05-20 DIAGNOSIS — M9905 Segmental and somatic dysfunction of pelvic region: Secondary | ICD-10-CM | POA: Diagnosis not present

## 2023-05-20 DIAGNOSIS — N2581 Secondary hyperparathyroidism of renal origin: Secondary | ICD-10-CM | POA: Diagnosis not present

## 2023-05-20 DIAGNOSIS — M9903 Segmental and somatic dysfunction of lumbar region: Secondary | ICD-10-CM | POA: Diagnosis not present

## 2023-05-20 DIAGNOSIS — M5136 Other intervertebral disc degeneration, lumbar region: Secondary | ICD-10-CM | POA: Diagnosis not present

## 2023-05-20 DIAGNOSIS — M9904 Segmental and somatic dysfunction of sacral region: Secondary | ICD-10-CM | POA: Diagnosis not present

## 2023-05-20 DIAGNOSIS — Z992 Dependence on renal dialysis: Secondary | ICD-10-CM | POA: Diagnosis not present

## 2023-05-20 DIAGNOSIS — N186 End stage renal disease: Secondary | ICD-10-CM | POA: Diagnosis not present

## 2023-05-21 DIAGNOSIS — M9903 Segmental and somatic dysfunction of lumbar region: Secondary | ICD-10-CM | POA: Diagnosis not present

## 2023-05-21 DIAGNOSIS — Z6830 Body mass index (BMI) 30.0-30.9, adult: Secondary | ICD-10-CM | POA: Diagnosis not present

## 2023-05-21 DIAGNOSIS — G4733 Obstructive sleep apnea (adult) (pediatric): Secondary | ICD-10-CM | POA: Diagnosis not present

## 2023-05-21 DIAGNOSIS — M9905 Segmental and somatic dysfunction of pelvic region: Secondary | ICD-10-CM | POA: Diagnosis not present

## 2023-05-21 DIAGNOSIS — M5136 Other intervertebral disc degeneration, lumbar region: Secondary | ICD-10-CM | POA: Diagnosis not present

## 2023-05-21 DIAGNOSIS — M9904 Segmental and somatic dysfunction of sacral region: Secondary | ICD-10-CM | POA: Diagnosis not present

## 2023-05-22 DIAGNOSIS — N186 End stage renal disease: Secondary | ICD-10-CM | POA: Diagnosis not present

## 2023-05-22 DIAGNOSIS — N2581 Secondary hyperparathyroidism of renal origin: Secondary | ICD-10-CM | POA: Diagnosis not present

## 2023-05-22 DIAGNOSIS — Z992 Dependence on renal dialysis: Secondary | ICD-10-CM | POA: Diagnosis not present

## 2023-05-25 DIAGNOSIS — Z992 Dependence on renal dialysis: Secondary | ICD-10-CM | POA: Diagnosis not present

## 2023-05-25 DIAGNOSIS — N2581 Secondary hyperparathyroidism of renal origin: Secondary | ICD-10-CM | POA: Diagnosis not present

## 2023-05-25 DIAGNOSIS — N186 End stage renal disease: Secondary | ICD-10-CM | POA: Diagnosis not present

## 2023-05-27 DIAGNOSIS — N2581 Secondary hyperparathyroidism of renal origin: Secondary | ICD-10-CM | POA: Diagnosis not present

## 2023-05-27 DIAGNOSIS — I129 Hypertensive chronic kidney disease with stage 1 through stage 4 chronic kidney disease, or unspecified chronic kidney disease: Secondary | ICD-10-CM | POA: Diagnosis not present

## 2023-05-27 DIAGNOSIS — N186 End stage renal disease: Secondary | ICD-10-CM | POA: Diagnosis not present

## 2023-05-27 DIAGNOSIS — Z992 Dependence on renal dialysis: Secondary | ICD-10-CM | POA: Diagnosis not present

## 2023-05-29 DIAGNOSIS — N2581 Secondary hyperparathyroidism of renal origin: Secondary | ICD-10-CM | POA: Diagnosis not present

## 2023-05-29 DIAGNOSIS — N186 End stage renal disease: Secondary | ICD-10-CM | POA: Diagnosis not present

## 2023-05-29 DIAGNOSIS — Z992 Dependence on renal dialysis: Secondary | ICD-10-CM | POA: Diagnosis not present

## 2023-06-01 ENCOUNTER — Other Ambulatory Visit: Payer: Self-pay | Admitting: Allergy & Immunology

## 2023-06-01 DIAGNOSIS — N2581 Secondary hyperparathyroidism of renal origin: Secondary | ICD-10-CM | POA: Diagnosis not present

## 2023-06-01 DIAGNOSIS — N186 End stage renal disease: Secondary | ICD-10-CM | POA: Diagnosis not present

## 2023-06-01 DIAGNOSIS — Z992 Dependence on renal dialysis: Secondary | ICD-10-CM | POA: Diagnosis not present

## 2023-06-02 DIAGNOSIS — Z949 Transplanted organ and tissue status, unspecified: Secondary | ICD-10-CM | POA: Diagnosis not present

## 2023-06-02 DIAGNOSIS — N186 End stage renal disease: Secondary | ICD-10-CM | POA: Diagnosis not present

## 2023-06-03 DIAGNOSIS — Z992 Dependence on renal dialysis: Secondary | ICD-10-CM | POA: Diagnosis not present

## 2023-06-03 DIAGNOSIS — N2581 Secondary hyperparathyroidism of renal origin: Secondary | ICD-10-CM | POA: Diagnosis not present

## 2023-06-03 DIAGNOSIS — N186 End stage renal disease: Secondary | ICD-10-CM | POA: Diagnosis not present

## 2023-06-05 DIAGNOSIS — Z992 Dependence on renal dialysis: Secondary | ICD-10-CM | POA: Diagnosis not present

## 2023-06-05 DIAGNOSIS — N186 End stage renal disease: Secondary | ICD-10-CM | POA: Diagnosis not present

## 2023-06-05 DIAGNOSIS — I5022 Chronic systolic (congestive) heart failure: Secondary | ICD-10-CM | POA: Diagnosis not present

## 2023-06-05 DIAGNOSIS — I480 Paroxysmal atrial fibrillation: Secondary | ICD-10-CM | POA: Diagnosis not present

## 2023-06-05 DIAGNOSIS — N2581 Secondary hyperparathyroidism of renal origin: Secondary | ICD-10-CM | POA: Diagnosis not present

## 2023-06-08 DIAGNOSIS — N2581 Secondary hyperparathyroidism of renal origin: Secondary | ICD-10-CM | POA: Diagnosis not present

## 2023-06-08 DIAGNOSIS — N186 End stage renal disease: Secondary | ICD-10-CM | POA: Diagnosis not present

## 2023-06-08 DIAGNOSIS — Z992 Dependence on renal dialysis: Secondary | ICD-10-CM | POA: Diagnosis not present

## 2023-06-10 ENCOUNTER — Other Ambulatory Visit: Payer: Self-pay | Admitting: Otolaryngology

## 2023-06-10 DIAGNOSIS — Z992 Dependence on renal dialysis: Secondary | ICD-10-CM | POA: Diagnosis not present

## 2023-06-10 DIAGNOSIS — N2581 Secondary hyperparathyroidism of renal origin: Secondary | ICD-10-CM | POA: Diagnosis not present

## 2023-06-10 DIAGNOSIS — N186 End stage renal disease: Secondary | ICD-10-CM | POA: Diagnosis not present

## 2023-06-11 ENCOUNTER — Ambulatory Visit: Payer: Medicare PPO | Attending: Cardiology | Admitting: Cardiology

## 2023-06-11 ENCOUNTER — Encounter: Payer: Self-pay | Admitting: Cardiology

## 2023-06-11 ENCOUNTER — Encounter (INDEPENDENT_AMBULATORY_CARE_PROVIDER_SITE_OTHER): Payer: Self-pay

## 2023-06-11 VITALS — BP 150/86 | HR 88 | Ht 70.0 in | Wt 218.4 lb

## 2023-06-11 DIAGNOSIS — I10 Essential (primary) hypertension: Secondary | ICD-10-CM | POA: Diagnosis not present

## 2023-06-11 DIAGNOSIS — I5022 Chronic systolic (congestive) heart failure: Secondary | ICD-10-CM | POA: Diagnosis not present

## 2023-06-11 DIAGNOSIS — I4892 Unspecified atrial flutter: Secondary | ICD-10-CM

## 2023-06-11 NOTE — Patient Instructions (Signed)
Medication Instructions:  Your physician recommends that you continue on your current medications as directed. Please refer to the Current Medication list given to you today.  *If you need a refill on your cardiac medications before your next appointment, please call your pharmacy*  Follow-Up: At [facility], you and your health needs are our priority.  As part of our continuing mission to provide you with exceptional heart care, we have created designated Provider Care Teams.  These Care Teams include your primary Cardiologist (physician) and Advanced Practice Providers (APPs -  Physician Assistants and Nurse Practitioners) who all work together to provide you with the care you need, when you need it.  Your next appointment:   1 year  Provider:   You may see CAMERON T LAMBERT, MD or one of the following Advanced Practice Providers on your designated Care Team:   Renee Ursuy, PA-C Michael "Andy" Tillery, PA-C Suzann Riddle, NP Brandi Ollis, NP   

## 2023-06-11 NOTE — Progress Notes (Signed)
  Electrophysiology Office Note:    Date:  06/11/2023   ID:  Albert Eaton, DOB 20-Jan-1966, MRN [id number]  [facility] Cardiologist:  Tonny Bollman, MD  [facility] Electrophysiologist:  Lanier Prude, MD   Referring MD: [name], NP   Chief Complaint: Atrial flutter  History of Present Illness:    Albert Eaton is a 57 y.o. malewho I am seeing today for an evaluation of atrial flutter.  I last saw the patient Albert Eaton 25, 2022.  He has a history of end-stage renal disease and is currently listed for transplant at [facility].  He is being listed at [facility] soon.  The patient has a prior history of atrial tachycardia.  He has a history of HIV.   He recently wore a heart monitor after seeing Albert Eaton.  Heart monitor showed 2% atrial flutter burden.  He tells me that [facility] would prefer him to be on Coumadin for stroke prophylaxis.  [facility] apparently does not mind if he is on Eliquis or Coumadin.       Their past medical, social and family history was reveiwed.   ROS:   Please see the history of present illness.    All other systems reviewed and are negative.  EKGs/Labs/Other Studies Reviewed:    The following studies were reviewed today:  June 05, 2023 ZIO monitor 2% atrial flutter burden.  Atrial cycle length approximately 200 ms.        Physical Exam:    VS:  BP (!) 150/86 (BP Location: Right Arm, Patient Position: Sitting, Cuff Size: Large)   Pulse 88   Ht 5\' 10"  (1.778 m)   Wt 218 lb 6.4 oz (99.1 kg)   SpO2 98%   BMI 31.34 kg/m     Wt Readings from Last 3 Encounters:  06/11/23 218 lb 6.4 oz (99.1 kg)  05/14/23 211 lb 8 oz (95.9 kg)  05/05/23 208 lb 3.2 oz (94.4 kg)     GEN:  Well nourished, well developed in no acute distress CARDIAC: RRR, no murmurs, rubs, gallops RESPIRATORY:  Clear to auscultation without rales, wheezing or rhonchi       ASSESSMENT AND PLAN:    1. Atrial flutter, unspecified type (HCC)   2. Primary hypertension   3.  Chronic systolic heart failure (HCC)     #Atrial flutter The patient has a diagnosis of atrial flutter on recent ZIO monitor.  Previously he had A. tach but now it is clearly atrial flutter with a cycle length in the [facility] of 200 ms.  Recommend anticoagulation given an elevated CHA2DS2-VASc.  I am okay with him taking Coumadin or Eliquis.  Right now he is taking Eliquis and tolerating it well.  If it would help his transplant status to be switched to Coumadin we can arrange for this.  #Hypertension Above goal today.  Recommend checking blood pressures 1-2 times per week at home and recording the values.  Recommend bringing these recordings to the primary care physician. Continue Norvasc.  #Chronic systolic heart failure #Post mitral valve repair NYHA class I-II.  Warm and dry on exam today.  EF 45 to 50% on November 2023 echo.   Follow-up 1 year with APP.    Signed, Rossie Muskrat. Lalla Brothers, MD, [facility], [facility] 06/11/2023 4:45 PM    Electrophysiology [facility]

## 2023-06-12 DIAGNOSIS — Z992 Dependence on renal dialysis: Secondary | ICD-10-CM | POA: Diagnosis not present

## 2023-06-12 DIAGNOSIS — N2581 Secondary hyperparathyroidism of renal origin: Secondary | ICD-10-CM | POA: Diagnosis not present

## 2023-06-12 DIAGNOSIS — N186 End stage renal disease: Secondary | ICD-10-CM | POA: Diagnosis not present

## 2023-06-15 DIAGNOSIS — N186 End stage renal disease: Secondary | ICD-10-CM | POA: Diagnosis not present

## 2023-06-15 DIAGNOSIS — Z992 Dependence on renal dialysis: Secondary | ICD-10-CM | POA: Diagnosis not present

## 2023-06-15 DIAGNOSIS — N2581 Secondary hyperparathyroidism of renal origin: Secondary | ICD-10-CM | POA: Diagnosis not present

## 2023-06-17 DIAGNOSIS — Z992 Dependence on renal dialysis: Secondary | ICD-10-CM | POA: Diagnosis not present

## 2023-06-17 DIAGNOSIS — N186 End stage renal disease: Secondary | ICD-10-CM | POA: Diagnosis not present

## 2023-06-17 DIAGNOSIS — N2581 Secondary hyperparathyroidism of renal origin: Secondary | ICD-10-CM | POA: Diagnosis not present

## 2023-06-19 DIAGNOSIS — N186 End stage renal disease: Secondary | ICD-10-CM | POA: Diagnosis not present

## 2023-06-19 DIAGNOSIS — N2581 Secondary hyperparathyroidism of renal origin: Secondary | ICD-10-CM | POA: Diagnosis not present

## 2023-06-19 DIAGNOSIS — Z992 Dependence on renal dialysis: Secondary | ICD-10-CM | POA: Diagnosis not present

## 2023-06-24 DIAGNOSIS — N2581 Secondary hyperparathyroidism of renal origin: Secondary | ICD-10-CM | POA: Diagnosis not present

## 2023-06-24 DIAGNOSIS — Z992 Dependence on renal dialysis: Secondary | ICD-10-CM | POA: Diagnosis not present

## 2023-06-24 DIAGNOSIS — N186 End stage renal disease: Secondary | ICD-10-CM | POA: Diagnosis not present

## 2023-06-26 DIAGNOSIS — N186 End stage renal disease: Secondary | ICD-10-CM | POA: Diagnosis not present

## 2023-06-26 DIAGNOSIS — Z992 Dependence on renal dialysis: Secondary | ICD-10-CM | POA: Diagnosis not present

## 2023-06-26 DIAGNOSIS — N2581 Secondary hyperparathyroidism of renal origin: Secondary | ICD-10-CM | POA: Diagnosis not present

## 2023-06-27 DIAGNOSIS — I129 Hypertensive chronic kidney disease with stage 1 through stage 4 chronic kidney disease, or unspecified chronic kidney disease: Secondary | ICD-10-CM | POA: Diagnosis not present

## 2023-06-27 DIAGNOSIS — Z992 Dependence on renal dialysis: Secondary | ICD-10-CM | POA: Diagnosis not present

## 2023-06-27 DIAGNOSIS — N186 End stage renal disease: Secondary | ICD-10-CM | POA: Diagnosis not present

## 2023-06-29 DIAGNOSIS — Z992 Dependence on renal dialysis: Secondary | ICD-10-CM | POA: Diagnosis not present

## 2023-06-29 DIAGNOSIS — N2581 Secondary hyperparathyroidism of renal origin: Secondary | ICD-10-CM | POA: Diagnosis not present

## 2023-06-29 DIAGNOSIS — N186 End stage renal disease: Secondary | ICD-10-CM | POA: Diagnosis not present

## 2023-07-01 ENCOUNTER — Other Ambulatory Visit: Payer: Self-pay | Admitting: Allergy & Immunology

## 2023-07-01 DIAGNOSIS — N186 End stage renal disease: Secondary | ICD-10-CM | POA: Diagnosis not present

## 2023-07-01 DIAGNOSIS — Z992 Dependence on renal dialysis: Secondary | ICD-10-CM | POA: Diagnosis not present

## 2023-07-01 DIAGNOSIS — N2581 Secondary hyperparathyroidism of renal origin: Secondary | ICD-10-CM | POA: Diagnosis not present

## 2023-07-03 ENCOUNTER — Other Ambulatory Visit: Payer: Self-pay

## 2023-07-03 ENCOUNTER — Encounter (HOSPITAL_COMMUNITY): Payer: Self-pay | Admitting: Otolaryngology

## 2023-07-03 DIAGNOSIS — N186 End stage renal disease: Secondary | ICD-10-CM | POA: Diagnosis not present

## 2023-07-03 DIAGNOSIS — Z992 Dependence on renal dialysis: Secondary | ICD-10-CM | POA: Diagnosis not present

## 2023-07-03 DIAGNOSIS — N2581 Secondary hyperparathyroidism of renal origin: Secondary | ICD-10-CM | POA: Diagnosis not present

## 2023-07-03 NOTE — Progress Notes (Addendum)
PCP - Mordecai Maes Cardiologist - Tonny Bollman Cardiology Electropyshiology DR Steffanie Dunn  PPM/ICD - Denies Device Orders - n/a Rep Notified - n/a  Chest x-ray - 03-13-23 EKG - 05-05-23 Stress Test - 01-31-20 ECHO - 02-17-23 Cardiac Cath - 09-11-22  CPAP - Unable to tolerate  DM- denies   Blood Thinner Instructions: apixaban Everlene Balls) TO follow with Dr Jenne Pane Aspirin Instructions: n/a  ERAS Protcol -  ERAS  nodrink  COVID TEST- n/a  Anesthesia review: yes  Patient verbally denies any shortness of breath, fever, cough and chest pain during phone call   -------------  SDW INSTRUCTIONS given:  Your procedure is scheduled on Sept. 9,2024   Report to [facility] Main Entrance "A" at 10:30 A.M., and check in at the Admitting office.  Call this number if you have problems the morning of surgery:  [phone]   Remember:  Do not eat after midnight the night before your surgery  You may drink clear liquids until 10:00 A.M. the morning of your surgery.   Clear liquids allowed are: Water, Non-Citrus Juices (without pulp), Carbonated Beverages, Clear Tea, Black Coffee Only, and Gatorade    Take these medicines the morning of surgery with A SIP OF WATER   allopurinol (ZYLOPRIM)  amLODipine (NORVASC)  BIKTARVY  cetirizine (ZYRTEC)  famotidine (PEPCID)  ferric citrate (AURYXIA)  levothyroxine (SYNTHROID  metoprolol tartrate (LOPRESSOR)  pantoprazole (PROTONIX)  rosuvastatin (CRESTOR  SYMBICORT    IF NEEDED ; acetaminophen  azelastine (ASTELIN)  gabapentin (NEURONTIN)  ipratropium (ATROVENT)  lactulose (CHRONULAC)  levalbuterol (XOPENEX HFA)  lubiprostone (AMITIZA)  Olopatadine HCl (PATADAY)      As of today, STOP taking any Aspirin (unless otherwise instructed by your surgeon) Aleve, Naproxen, Ibuprofen, Motrin, Advil, Goody's, BC's, all herbal medications, fish oil, and all vitamins.                      Do not wear jewelry, make up, or nail polish             Do not wear lotions, powders, perfumes/colognes, or deodorant.            Do not shave 48 hours prior to surgery.  Men may shave face and neck.            Do not bring valuables to the hospital.            [facility] is not responsible for any belongings or valuables.  Do NOT Smoke (Tobacco/Vaping) 24 hours prior to your procedure If you use a CPAP at night, you may bring all equipment for your overnight stay.   Contacts, glasses, dentures or bridgework may not be worn into surgery.      For patients admitted to the hospital, discharge time will be determined by your treatment team.   Patients discharged the day of surgery will not be allowed to drive home, and someone needs to stay with them for 24 hours.    Special instructions:   [place]- Preparing For Surgery  Before surgery, you can play an important role. Because skin is not sterile, your skin needs to be as free of germs as possible. You can reduce the number of germs on your skin by washing with CHG (chlorahexidine gluconate) Soap before surgery.  CHG is an antiseptic cleaner which kills germs and bonds with the skin to continue killing germs even after washing.    Oral Hygiene is also important to reduce your risk of infection.  Remember - BRUSH YOUR TEETH THE MORNING OF SURGERY WITH YOUR REGULAR TOOTHPASTE  Please do not use if you have an allergy to CHG or antibacterial soaps. If your skin becomes reddened/irritated stop using the CHG.  Do not shave (including legs and underarms) for at least 48 hours prior to first CHG shower. It is OK to shave your face.  Please follow these instructions carefully.   Shower the NIGHT BEFORE SURGERY and the MORNING OF SURGERY with DIAL Soap.   Pat yourself dry with a CLEAN TOWEL.  Wear CLEAN PAJAMAS to bed the night before surgery  Place CLEAN SHEETS on your bed the night of your first shower and DO NOT SLEEP WITH PETS.   Day of Surgery: Please shower morning of surgery  Wear  Clean/Comfortable clothing the morning of surgery Do not apply any deodorants/lotions.   Remember to brush your teeth WITH YOUR REGULAR TOOTHPASTE.   Questions were answered. Patient verbalized understanding of instructions.

## 2023-07-03 NOTE — Progress Notes (Signed)
Followed with patient regarding Eliquis states "assistant at Dr. Jenne Pane office it was ok either way taking or not taking medication prior to procedure

## 2023-07-03 NOTE — Anesthesia Preprocedure Evaluation (Addendum)
Anesthesia Evaluation  Patient identified by MRN, date of birth, ID band Patient awake    Reviewed: Allergy & Precautions, NPO status , Patient's Chart, lab work & pertinent test results, reviewed documented beta blocker date and time   Airway Mallampati: II  TM Distance: >3 FB Neck ROM: Full    Dental no notable dental hx.    Pulmonary asthma , sleep apnea , former smoker   Pulmonary exam normal breath sounds clear to auscultation       Cardiovascular hypertension, + CAD, + Past MI and +CHF  Normal cardiovascular exam+ dysrhythmias  Rhythm:Regular Rate:Normal     Neuro/Psych  Headaches CVA    GI/Hepatic ,GERD  ,,(+) Hepatitis -  Endo/Other  Hypothyroidism    Renal/GU Renal disease     Musculoskeletal  (+) Arthritis ,    Abdominal   Peds  Hematology  (+) Blood dyscrasia, anemia   Anesthesia Other Findings   Reproductive/Obstetrics                              Anesthesia Physical Anesthesia Plan  ASA: 4  Anesthesia Plan: MAC   Post-op Pain Management:    Induction: Intravenous  PONV Risk Score and Plan: Propofol infusion and Treatment may vary due to age or medical condition  Airway Management Planned: Natural Airway  Additional Equipment:   Intra-op Plan:   Post-operative Plan:   Informed Consent: I have reviewed the patients History and Physical, chart, labs and discussed the procedure including the risks, benefits and alternatives for the proposed anesthesia with the patient or authorized representative who has indicated his/her understanding and acceptance.     Dental advisory given  Plan Discussed with: CRNA  Anesthesia Plan Comments: (PAT note written 07/03/2023 by Shonna Chock, PA-C.  History includes former smoker (quit 10/27/98), HTN, HLD, ESRD (PD catheter 05/30/21-02/13/22; HD [place] [facility] MWF, LUE AVF), renal cell cancer (s/p left nephrectomy 02/18/21), CAD  (mild-moderate, nonobstructive 08/2022), chronic diastolic CHF, severe MR (s/p right min-thoracotomy, MV repair using 30 mm annuloplasty ing 08/21/22; small left frontal lobe infarct on post-op CT), paroxysmal afib/aflutter (PAF 12/02/21; 2% aflutter burden 04/2023 monitor), HIV & chronic hepatitis B (treated with Biktarvy), anemia, GERD, hypothyroidism, neuropathy, OSA (intolerant to CPAP), dilated thoracic aorta (3.4 cm thoracic arch 03/13/23 CTA). S/p cardiac arrest 09/08/22 during hemodialysis, s/p epinephrine x2 and CPR, and calcium gluconate for PEA (SVT versus afib) with stable coronary anatomy by LHC.    He had recent EP evaluation with Dr. Lalla Brothers on 06/11/23 for finding of 2% burden aflutter on July 2024 Zio monitor. He previously had known atrial tachycardia and had PAF diagnosed 12/02/21. Eliquis continued give elevated CHA2DS2-VASc socore. One year follow-up recommended. He had general cardiology follow-up with [name], PA-C on 05/05/23 for follow-up CAD, PAF, MV repair. Post-MV Repair limited TTE 08/2022 showed trivial MR (EF 45-50%09/09/22 TTE).  Continue SBE prophylaxis. No new chest pain. Continue Crestor. He is not on ASA due to being on Eliquis. Zio monitor ordered for dyspnea with some elevated HR (which prompted referral back to EP). Six month follow-up recommended.    He is on Eliquis. He indicated that he was told he would not have to hold for this procedure.   Anesthesia team to evaluate on the day of surgery. He will need labs on arrival given ESRD. )        Anesthesia Quick Evaluation

## 2023-07-03 NOTE — Progress Notes (Signed)
Anesthesia Chart Review: Albert Eaton  Case: [id number] Date/Time: 07/06/23 1248   Procedure: DRUG INDUCED SLEEP ENDOSCOPY (Bilateral)   Anesthesia type: General   Pre-op diagnosis:      Obstructive sleep apnea syndrome     BMI 30.0-30.9,adult   [facility] ROOM 12 / [facility]   Surgeons: Christia Reading, MD       DISCUSSION: Patient is a 57 year old male scheduled for the above procedure.  History includes former smoker (quit 10/27/98), HTN, HLD, ESRD (PD catheter 05/30/21-02/13/22; HD [place] [facility] MWF, LUE AVF), renal cell cancer (s/p left nephrectomy 02/18/21), CAD (mild-moderate, nonobstructive 08/2022), chronic diastolic CHF, severe MR (s/p right min-thoracotomy, MV repair using 30 mm annuloplasty ing 08/21/22; small left frontal lobe infarct on post-op CT), paroxysmal afib/aflutter (PAF 12/02/21; 2% aflutter burden 04/2023 monitor), HIV & chronic hepatitis B (treated with Biktarvy), anemia, GERD, hypothyroidism, neuropathy, OSA (intolerant to CPAP), dilated thoracic aorta (3.4 cm thoracic arch 03/13/23 CTA). S/p cardiac arrest 09/08/22 during hemodialysis, s/p epinephrine x2 and CPR, and calcium gluconate for PEA (SVT versus afib) with stable coronary anatomy by LHC.   He had recent EP evaluation with Dr. Lalla Brothers on 06/11/23 for finding of 2% burden aflutter on July 2024 Zio monitor. He previously had known atrial tachycardia and had PAF diagnosed 12/02/21. Eliquis continued give elevated CHA2DS2-VASc socore. One year follow-up recommended. He had general cardiology follow-up with [name], PA-C on 05/05/23 for follow-up CAD, PAF, MV repair. Post-MV Repair limited TTE 08/2022 showed trivial MR (EF 45-50%09/09/22 TTE).  Continue SBE prophylaxis. No new chest pain. Continue Crestor. He is not on ASA due to being on Eliquis. Zio monitor ordered for dyspnea with some elevated HR (which prompted referral back to EP). Six month follow-up recommended.   He is on Eliquis. He indicated that he was told he would  not have to hold for this procedure.  Anesthesia team to evaluate on the day of surgery. He will need labs on arrival given ESRD.   VS:  Wt Readings from Last 3 Encounters:  06/11/23 99.1 kg  05/14/23 95.9 kg  05/05/23 94.4 kg   BP Readings from Last 3 Encounters:  06/11/23 (!) 150/86  05/14/23 (!) 140/80  05/05/23 136/88   Pulse Readings from Last 3 Encounters:  06/11/23 88  05/14/23 70  05/05/23 89     PROVIDERS: [name], NP is PCP  Tonny Bollman, MD is primary cardiologist Steffanie Dunn, MD is EP  Eugenio Hoes, MD is CT surgeon Erick Blinks, MD is GI Merwyn Katos, DO is ID Malachi Bonds, MD is allergist Virl Diamond, MD is pulmonologist   LABS: For day of surgery. Last lab results in [facility] include: Lab Results  Component Value Date   WBC 5.1 05/05/2023   HGB 11.9 (L) 05/05/2023   HCT 34.8 (L) 05/05/2023   PLT 285 05/05/2023   GLUCOSE 129 (H) 03/13/2023   ALT 15 05/05/2023   AST 14 05/05/2023   NA 135 03/13/2023   K 3.2 (L) 03/13/2023   CL 93 (L) 03/13/2023   CREATININE 11.82 (HH) 03/13/2023   BUN 46 (H) 03/13/2023   CO2 28 03/13/2023   TSH 2.868 09/08/2022   PSA 0.94 01/01/2023   HGBA1C 5.6 08/22/2022    IMAGES: CTA Chest 03/13/23: IMPRESSION: 1. No evidence of pulmonary embolism or other acute intrathoracic process. 2. Unable to assess for the possibility of aortic dissection on this exam timed for assessment of the pulmonary arterial tree. 3. Cardiomegaly with mildly  dilated central pulmonary vasculature, suggestive of pulmonary arterial hypertension. 4. 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 Resorptive changes at the fracture margins with adjacent soft tissue thickening/fibrosis suggesting a pseudoarthrosis. 5. Healed fractures of multiple bilateral anterior ribs. 6. Thoracic aortic arch measures 3.4 cm in diameter. Recommend annual imaging followup by CTA or MRA. This recommendation follows 2010  ACCF/AHA/AATS/ACR/ASA/SCA/SCAI/SIR/STS/SVM Guidelines for the Diagnosis and Management of Patients with Thoracic Aortic Disease. Circulation.2010; 121: I696-E952. Aortic aneurysm NOS (ICD10-I71.9) 7. Aortic and coronary artery atherosclerosis (ICD10-I70.0).   CT Perfusion Brain & CTA Head/Neck (post MV Repair) 08/21/22: IMPRESSION: 1. CT perfusion positive for a small area of infarct and ischemia left frontal lobe predominately in the white matter. 2. Negative for large vessel occlusion 3. No significant carotid or vertebral artery stenosis in the neck. 4. Postop changes right chest. Small right pneumothorax. Right chest tube in place.   EKG: 05/05/23: Sinus rhythm with occasional Premature ventricular complexes Normal axis No ST-TW changes Similar to prior tracing Confirmed by [name] [phone]) on 05/05/2023 10:01:55 AM   CV: Long Term Zio Monitor 05/10/23 - 05/24/23: Patient had a min HR of 53 bpm, max HR of 193 bpm, and avg HR of 81 bpm. Predominant underlying rhythm was Sinus Rhythm. 1 run of Ventricular Tachycardia occurred lasting 4 beats with a max rate of 193 bpm (avg 182 bpm). Atrial Flutter occurred (2%  burden), ranging from 96-177 bpm (avg of 132 bpm), the longest lasting 33 mins 59 secs with an avg rate of 137 bpm. Atrial Flutter may be possible Atrial Tachycardia with variable block. Isolated SVEs were occasional (3.5%, [id number]), SVE Couplets were  occasional (1.0%, 8279), and SVE Triplets were rare (<1.0%, 1481). Isolated VEs were occasional (2.4%, 38757), VE Couplets were rare (<1.0%, 2424), and VE Triplets were rare (<1.0%, 27). Ventricular Bigeminy and Trigeminy were present. SUMMARY: Normal sinus with average HR of 81 bpm. Atrial flutter occurs with a 2% burden, average heart rate during atrial flutter is 137 bpm. Occasional PVC's with a burden of 2.4%.    Echo (Limited) 02/17/23: IMPRESSIONS   1. No pericardial effusion.   2. Left atrial size was severely dilated.    3. Post mitral repair with trivial residual MR . The mitral valve has  been repaired/replaced. There is a 30 mm [employer] prosthetic annuloplasty  ring. present in the mitral position. Procedure Date: 07/2022.   4. Tricuspid valve regurgitation is moderate.   5. The aortic valve is tricuspid. Aortic valve regurgitation is not  visualized. No aortic stenosis is present.   6. Aortic dilatation noted. There is mild dilatation of the ascending  aorta, measuring 39 mm.    LHC 09/11/22:   Mid LAD lesion is 50% stenosed.   Prox LAD lesion is 50% stenosed.   Prox RCA lesion is 40% stenosed.   2nd Mrg lesion is 30% stenosed.   LV end diastolic pressure is mildly elevated.   There is no aortic valve stenosis. No change in coronary anatomy since October 2023.  Consider EP referral for consideration of AICD.     US Carotid 08/19/22: Summary:  - Right Carotid: The extracranial vessels were near-normal with only minimal  wall thickening or plaque.  - Left Carotid: The extracranial vessels were near-normal with only minimal  wall thickening or plaque.  - Vertebrals: Bilateral vertebral arteries demonstrate antegrade flow.    RHC/LHC 08/01/22: 1.  Mildly elevated pulmonary wedge pressure and PA pressure with mean wedge pressure of 17 mmHg and mean PA  pressure of 25 mmHg, high cardiac output likely related to Fick measurement inpatient with AV fistula 2.  Stable moderate nonobstructive coronary artery disease with patency of the left main, moderate 50% proximal LAD stenosis unchanged from the previous study, mild nonobstructive left circumflex stenosis (dominant vessel), and mild nonobstructive stenosis of a nondominant RCA Recommend: Continue evaluation per Dr. Leafy Ro with plans for surgical valve repair.   TEE (Pre-MV Repair) 06/03/22:  IMPRESSIONS   1. Severe mitral valve regurgitation. Mechanism appears likely atrial  functional and MR is commissural. No definite pulmonary vein reversals in   systole, may be due to low blood pressure during sedation. At ambulatory  blood pressures, suspect MR is  severe.. The mitral valve is grossly normal. Severe mitral valve  regurgitation. No evidence of mitral stenosis.   2. Left ventricular ejection fraction, by estimation, is 55%. The left  ventricle has normal function. The left ventricular internal cavity size  was mildly dilated.   3. Right ventricular systolic function is normal. The right ventricular  size is mildly enlarged.   4. Left atrial size was severely dilated. No left atrial/left atrial  appendage thrombus was detected. The LAA emptying velocity was 62 cm/s.   5. Right atrial size was moderately dilated.   6. Tricuspid valve regurgitation is moderate.   7. The aortic valve is tricuspid. Aortic valve regurgitation is trivial.  No aortic stenosis is present.   8. Aortic dilatation noted. There is mild dilatation of the ascending  aorta, measuring 40 mm. There is mild (Grade II) atheroma plaque involving  the aortic arch and descending aorta.   9. Agitated saline contrast bubble study was negative, with no evidence  of any interatrial shunt.     Past Medical History:  Diagnosis Date   Anemia    Aortic atherosclerosis (HCC)    Arthritis    Ascending aorta dilation (HCC) 02/17/2023   Limited TTE 02/17/23: no effusion, s/p MV repair w trivial residual MR, asc aorta 39 mm, mod TR, severe LAE   Asthma    Cancer (HCC)    renal cyst   Chronic diastolic CHF (congestive heart failure) (HCC) 01/06/2017   Echo 7/16 [facility] [place], [place]) Mild AI, mild LAE, mild concentric LVH, EF 55, normal wall motion, mild to moderate MR, mild PI, RVSP 55 // Echo 10/09/16 ([facility]):  Moderate LVH, grade 2 diastolic dysfunction, mild MR, moderate LAE    Esophagitis    ESRD (end stage renal disease) on dialysis [facility])    Dialysis Mon Wed Fri   GERD (gastroesophageal reflux disease)    Gout    no current problems   Hepatitis    Hep B    HIV (human immunodeficiency virus infection) (HCC)    HLD (hyperlipidemia)    Hypertension    Hypothyroidism    Mitral regurgitation    Neuropathy    Sleep apnea    uses c-pap   Wears glasses    Wears partial dentures     Past Surgical History:  Procedure Laterality Date   AV FISTULA PLACEMENT Left 07/02/2018   Procedure: Creation of Left arm BRACHIOBASILIC ARTERIOVENOUS  FISTULA;  Surgeon: Cephus Shelling, MD;  Location: [facility] OR;  Service: Vascular;  Laterality: Left;   BASCILIC VEIN TRANSPOSITION Left 08/27/2018   Procedure: Left arm BRACHIOBASILIC VEIN TRANSPOSITION SECOND STAGE;  Surgeon: Cephus Shelling, MD;  [facility];  Service: Vascular;  Laterality: Left;   BUBBLE STUDY  09/03/2020   Procedure: BUBBLE STUDY;  Surgeon: Pricilla Riffle, MD;  Location: [facility] ENDOSCOPY;  Service: Cardiovascular;;   BUBBLE STUDY  06/03/2022   Procedure: BUBBLE STUDY;  Surgeon: Parke Poisson, MD;  Location: [facility] ENDOSCOPY;  Service: Cardiology;;   CAPD INSERTION N/A 05/30/2021   Procedure: LAPAROSCOPIC INSERTION CONTINUOUS AMBULATORY PERITONEAL DIALYSIS  (CAPD) CATHETER;  Surgeon: Leonie Douglas, MD;  Location: [facility] OR;  Service: Vascular;  Laterality: N/A;   CAPD REMOVAL N/A 02/13/2022   Procedure: PERITONEAL DIALYSIS CATHETER REMOVAL;  Surgeon: Leonie Douglas, MD;  [facility];  Service: Vascular;  Laterality: N/A;   CHOLECYSTECTOMY     COLONOSCOPY W/ BIOPSIES AND POLYPECTOMY     GIVENS CAPSULE STUDY N/A 03/01/2018   Procedure: GIVENS CAPSULE STUDY;  Surgeon: Beverley Fiedler, MD;  Location: [facility] ENDOSCOPY;  Service: Gastroenterology;  Laterality: N/A;   HERNIA REPAIR     As baby   LEFT HEART CATH AND CORONARY ANGIOGRAPHY N/A 09/11/2022   Procedure: LEFT HEART CATH AND CORONARY ANGIOGRAPHY;  Surgeon: Corky Crafts, MD;  Location: [facility] INVASIVE CV LAB;  Service: Cardiovascular;  Laterality: N/A;   MITRAL VALVE REPAIR N/A 08/21/2022   Procedure: MINIMALLY INVASIVE MITRAL VALVE  REPAIR USING RING ANNULOPLASTY SIMULUS 30mm;  Surgeon: Eugenio Hoes, MD;  Location: [facility];  Service: Open Heart Surgery;  Laterality: N/A;   MULTIPLE TOOTH EXTRACTIONS     NEPHRECTOMY Left 02/18/2021   NOSE SURGERY     RENAL BIOPSY     RIGHT/LEFT HEART CATH AND CORONARY ANGIOGRAPHY N/A 09/04/2021   Procedure: RIGHT/LEFT HEART CATH AND CORONARY ANGIOGRAPHY;  Surgeon: Kathleene Hazel, MD;  [facility] INVASIVE CV LAB;  Service: Cardiovascular;  Laterality: N/A;   RIGHT/LEFT HEART CATH AND CORONARY ANGIOGRAPHY N/A 08/01/2022   Procedure: RIGHT/LEFT HEART CATH AND CORONARY ANGIOGRAPHY;  Surgeon: Tonny Bollman, MD;  Location: [facility] INVASIVE CV LAB;  Service: Cardiovascular;  Laterality: N/A;   TEE WITHOUT CARDIOVERSION N/A 09/03/2020   Procedure: TRANSESOPHAGEAL ECHOCARDIOGRAM (TEE);  Surgeon: Pricilla Riffle, MD;  Location: [facility] ENDOSCOPY;  Service: Cardiovascular;  Laterality: N/A;   TEE WITHOUT CARDIOVERSION N/A 06/03/2022   Procedure: TRANSESOPHAGEAL ECHOCARDIOGRAM (TEE);  Surgeon: Parke Poisson, MD;  Location: [facility] ENDOSCOPY;  Service: Cardiology;  Laterality: N/A;   TEE WITHOUT CARDIOVERSION N/A 08/21/2022   Procedure: TRANSESOPHAGEAL ECHOCARDIOGRAM (TEE);  Surgeon: Eugenio Hoes, MD;  Location: [facility] OR;  Service: Open Heart Surgery;  Laterality: N/A;   UPPER GI ENDOSCOPY  04/05/2021    MEDICATIONS: No current facility-administered medications for this encounter.    acetaminophen (ACETAMINOPHEN 8 HOUR) 650 MG CR tablet   allopurinol (ZYLOPRIM) 100 MG tablet   amLODipine (NORVASC) 2.5 MG tablet   apixaban (ELIQUIS) 5 MG TABS tablet   azelastine (ASTELIN) 0.1 % nasal spray   B Complex-C-Zn-Folic Acid (DIALYVITE 800-ZINC 15 PO)   BIKTARVY 50-200-25 MG TABS tablet   cetirizine (ZYRTEC) 10 MG tablet   diclofenac Sodium (VOLTAREN) 1 % GEL   famotidine (PEPCID) 20 MG tablet   ferric citrate (AURYXIA) 1 GM 210 MG(Fe) tablet   fluticasone (FLOVENT HFA) 110 MCG/ACT inhaler   gabapentin  (NEURONTIN) 300 MG capsule   ipratropium (ATROVENT) 0.06 % nasal spray   lactulose (CHRONULAC) 10 GM/15ML solution   levalbuterol (XOPENEX HFA) 45 MCG/ACT inhaler   levothyroxine (SYNTHROID) 25 MCG tablet   lubiprostone (AMITIZA) 24 MCG capsule   metoprolol tartrate (LOPRESSOR) 25 MG tablet   Olopatadine HCl (PATADAY) 0.7 % SOLN   pantoprazole (PROTONIX) 40 MG tablet   rOPINIRole (REQUIP) 1  MG tablet   rosuvastatin (CRESTOR) 10 MG tablet   SENSIPAR 60 MG tablet   SYMBICORT 160-4.5 MCG/ACT inhaler   Vitamin D, Ergocalciferol, (DRISDOL) 1.25 MG (50000 UNIT) CAPS capsule   Doxercalciferol (HECTOROL IV)   heparin sodium, porcine, 1000 UNIT/ML injection   Methoxy PEG-Epoetin Beta (MIRCERA IJ)   Spacer/Aero-Holding Rudean Curt     Shonna Chock, PA-C Surgical Short Stay/Anesthesiology [facility] Phone [phone] [facility] Phone [phone] 07/03/2023 4:40 PM

## 2023-07-06 ENCOUNTER — Ambulatory Visit (HOSPITAL_COMMUNITY): Payer: Self-pay | Admitting: Physician Assistant

## 2023-07-06 ENCOUNTER — Encounter (HOSPITAL_COMMUNITY)
Admission: RE | Disposition: A | Discharge status: Home / Self Care | Payer: Self-pay | Source: Home / Self Care | Attending: Otolaryngology

## 2023-07-06 ENCOUNTER — Other Ambulatory Visit: Payer: Self-pay | Admitting: Pulmonary Disease

## 2023-07-06 ENCOUNTER — Ambulatory Visit (HOSPITAL_BASED_OUTPATIENT_CLINIC_OR_DEPARTMENT_OTHER): Payer: Medicare PPO | Admitting: Physician Assistant

## 2023-07-06 ENCOUNTER — Ambulatory Visit (HOSPITAL_COMMUNITY)
Admission: RE | Admit: 2023-07-06 | Discharge: 2023-07-06 | Disposition: A | Discharge status: Home / Self Care | Payer: Medicare PPO | Attending: Otolaryngology | Admitting: Otolaryngology

## 2023-07-06 DIAGNOSIS — I493 Ventricular premature depolarization: Secondary | ICD-10-CM | POA: Diagnosis not present

## 2023-07-06 DIAGNOSIS — N186 End stage renal disease: Secondary | ICD-10-CM | POA: Insufficient documentation

## 2023-07-06 DIAGNOSIS — I5032 Chronic diastolic (congestive) heart failure: Secondary | ICD-10-CM | POA: Diagnosis not present

## 2023-07-06 DIAGNOSIS — Z79899 Other long term (current) drug therapy: Secondary | ICD-10-CM | POA: Insufficient documentation

## 2023-07-06 DIAGNOSIS — I472 Ventricular tachycardia, unspecified: Secondary | ICD-10-CM | POA: Insufficient documentation

## 2023-07-06 DIAGNOSIS — E039 Hypothyroidism, unspecified: Secondary | ICD-10-CM | POA: Diagnosis not present

## 2023-07-06 DIAGNOSIS — Z7901 Long term (current) use of anticoagulants: Secondary | ICD-10-CM | POA: Insufficient documentation

## 2023-07-06 DIAGNOSIS — I5033 Acute on chronic diastolic (congestive) heart failure: Secondary | ICD-10-CM | POA: Diagnosis not present

## 2023-07-06 DIAGNOSIS — Z87891 Personal history of nicotine dependence: Secondary | ICD-10-CM | POA: Insufficient documentation

## 2023-07-06 DIAGNOSIS — I700 Atherosclerosis of aorta: Secondary | ICD-10-CM | POA: Insufficient documentation

## 2023-07-06 DIAGNOSIS — I4892 Unspecified atrial flutter: Secondary | ICD-10-CM | POA: Insufficient documentation

## 2023-07-06 DIAGNOSIS — I480 Paroxysmal atrial fibrillation: Secondary | ICD-10-CM | POA: Diagnosis not present

## 2023-07-06 DIAGNOSIS — Z905 Acquired absence of kidney: Secondary | ICD-10-CM | POA: Diagnosis not present

## 2023-07-06 DIAGNOSIS — Z8674 Personal history of sudden cardiac arrest: Secondary | ICD-10-CM | POA: Insufficient documentation

## 2023-07-06 DIAGNOSIS — G4733 Obstructive sleep apnea (adult) (pediatric): Secondary | ICD-10-CM | POA: Insufficient documentation

## 2023-07-06 DIAGNOSIS — I132 Hypertensive heart and chronic kidney disease with heart failure and with stage 5 chronic kidney disease, or end stage renal disease: Secondary | ICD-10-CM | POA: Insufficient documentation

## 2023-07-06 DIAGNOSIS — B181 Chronic viral hepatitis B without delta-agent: Secondary | ICD-10-CM | POA: Insufficient documentation

## 2023-07-06 DIAGNOSIS — N2581 Secondary hyperparathyroidism of renal origin: Secondary | ICD-10-CM | POA: Diagnosis not present

## 2023-07-06 DIAGNOSIS — G629 Polyneuropathy, unspecified: Secondary | ICD-10-CM | POA: Diagnosis not present

## 2023-07-06 DIAGNOSIS — I083 Combined rheumatic disorders of mitral, aortic and tricuspid valves: Secondary | ICD-10-CM | POA: Diagnosis not present

## 2023-07-06 DIAGNOSIS — Z992 Dependence on renal dialysis: Secondary | ICD-10-CM | POA: Insufficient documentation

## 2023-07-06 DIAGNOSIS — E785 Hyperlipidemia, unspecified: Secondary | ICD-10-CM | POA: Insufficient documentation

## 2023-07-06 DIAGNOSIS — Z21 Asymptomatic human immunodeficiency virus [HIV] infection status: Secondary | ICD-10-CM | POA: Insufficient documentation

## 2023-07-06 DIAGNOSIS — I2510 Atherosclerotic heart disease of native coronary artery without angina pectoris: Secondary | ICD-10-CM | POA: Insufficient documentation

## 2023-07-06 HISTORY — DX: Cardiac arrhythmia, unspecified: I49.9

## 2023-07-06 HISTORY — PX: DRUG INDUCED ENDOSCOPY: SHX6808

## 2023-07-06 LAB — POCT I-STAT, CHEM 8
BUN: 24 mg/dL — ABNORMAL HIGH (ref 6–20)
Calcium, Ion: 1.06 mmol/L — ABNORMAL LOW (ref 1.15–1.40)
Chloride: 100 mmol/L (ref 98–111)
Creatinine, Ser: 7.6 mg/dL — ABNORMAL HIGH (ref 0.61–1.24)
Glucose, Bld: 100 mg/dL — ABNORMAL HIGH (ref 70–99)
HCT: 35 % — ABNORMAL LOW (ref 39.0–52.0)
Hemoglobin: 11.9 g/dL — ABNORMAL LOW (ref 13.0–17.0)
Potassium: 3.3 mmol/L — ABNORMAL LOW (ref 3.5–5.1)
Sodium: 137 mmol/L (ref 135–145)
TCO2: 24 mmol/L (ref 22–32)

## 2023-07-06 SURGERY — DRUG INDUCED SLEEP ENDOSCOPY
Anesthesia: Monitor Anesthesia Care | Laterality: Bilateral

## 2023-07-06 MED ORDER — ORAL CARE MOUTH RINSE: 15.0000 mL | Freq: Once | OROMUCOSAL | Status: AC

## 2023-07-06 MED ORDER — OXYMETAZOLINE HCL 0.05 % NA SOLN
NASAL | Status: AC
  Filled 2023-07-06: qty 30

## 2023-07-06 MED ORDER — CHLORHEXIDINE GLUCONATE 0.12 % MT SOLN: 15.0000 mL | Freq: Once | OROMUCOSAL | Status: AC

## 2023-07-06 MED ORDER — OXYMETAZOLINE HCL 0.05 % NA SOLN
NASAL | Status: DC | PRN
  Administered 2023-07-06: 1 via TOPICAL

## 2023-07-06 MED ORDER — CHLORHEXIDINE GLUCONATE 0.12 % MT SOLN
OROMUCOSAL | Status: AC
  Administered 2023-07-06: 15 mL via OROMUCOSAL
  Filled 2023-07-06: qty 15

## 2023-07-06 MED ORDER — SODIUM CHLORIDE 0.9 % IV SOLN: INTRAVENOUS | Status: DC

## 2023-07-06 MED ORDER — PROPOFOL 10 MG/ML IV BOLUS
INTRAVENOUS | Status: DC | PRN
  Administered 2023-07-06: 10 mg via INTRAVENOUS
  Administered 2023-07-06: 100 ug/kg/min via INTRAVENOUS

## 2023-07-06 SURGICAL SUPPLY — 13 items
BAG COUNTER SPONGE SURGICOUNT (BAG) IMPLANT
BAG SPNG CNTER NS LX DISP (BAG)
CANISTER SUCT 1200ML W/VALVE (MISCELLANEOUS) ×1 IMPLANT
GLOVE BIO SURGEON STRL SZ7.5 (GLOVE) ×1 IMPLANT
KIT BASIN OR (CUSTOM PROCEDURE TRAY) ×1 IMPLANT
NDL PRECISIONGLIDE 27X1.5 (NEEDLE) IMPLANT
NEEDLE PRECISIONGLIDE 27X1.5 (NEEDLE)
PATTIES SURGICAL .5 X3 (DISPOSABLE) ×1 IMPLANT
SHEET MEDIUM DRAPE 40X70 STRL (DRAPES) IMPLANT
SOL ANTI FOG 6CC (MISCELLANEOUS) ×1 IMPLANT
SYR CONTROL 10ML LL (SYRINGE) IMPLANT
TOWEL GREEN STERILE FF (TOWEL DISPOSABLE) ×1 IMPLANT
TUBE CONNECTING 20X1/4 (TUBING) ×1 IMPLANT

## 2023-07-06 NOTE — Op Note (Signed)
Preop diagnosis: Obstructive sleep apnea Postop diagnosis: same Procedure: Drug-induced sleep endoscopy Surgeon: Jenne Pane Anesth: IV sedation Compl: None Findings: There is 50% anterior-posterior and 50% lateral wall collapse at the velum making him a candidate for hypoglossal nerve stimulator placement.  There was also lateral wall collapse at the tongue base. Description:  After discussing risks, benefits, and alternatives, the patient was brought to the operative suite and placed on the operative table in the supine position.  Anesthesia was induced and the patient was given light sedation to simulate natural sleep. When the proper level was reached, an Afrin-soaked pledget was placed in the right nasal passage for a couple of minutes and then removed.  The fiberoptic laryngoscope was then passed to view the pharynx and larynx.  Findings are noted above and the exam was recorded.  After completion, the scope was removed and the patient was returned to anesthesia for wakeup and was moved to the recovery room in stable condition.

## 2023-07-06 NOTE — H&P (Signed)
Albert Eaton is an 57 y.o. male.   Chief Complaint: Sleep apnea HPI: 57 year old male with sleep apnea who has been unable to tolerate CPAP.  Past Medical History:  Diagnosis Date   Anemia    Aortic atherosclerosis (HCC)    Arthritis    Ascending aorta dilation (HCC) 02/17/2023   Limited TTE 02/17/23: no effusion, s/p MV repair w trivial residual MR, asc aorta 39 mm, mod TR, severe LAE   Asthma    Cancer (HCC)    renal cyst   Chronic diastolic CHF (congestive heart failure) (HCC) 01/06/2017   Echo 7/16 [facility] in [place], [place]) Mild AI, mild LAE, mild concentric LVH, EF 55, normal wall motion, mild to moderate MR, mild PI, RVSP 55 // Echo 10/09/16 ([facility]):  Moderate LVH, grade 2 diastolic dysfunction, mild MR, moderate LAE    Dysrhythmia    paroxysmal afib/flutter   Esophagitis    ESRD (end stage renal disease) on dialysis [facility])    Dialysis Mon Wed Fri   GERD (gastroesophageal reflux disease)    Gout    no current problems   Hepatitis    Hep B   HIV (human immunodeficiency virus infection) (HCC)    HLD (hyperlipidemia)    Hypertension    Hypothyroidism    Mitral regurgitation    Neuropathy    Sleep apnea    uses c-pap   Wears glasses    Wears partial dentures     Past Surgical History:  Procedure Laterality Date   AV FISTULA PLACEMENT Left 07/02/2018   Procedure: Creation of Left arm BRACHIOBASILIC ARTERIOVENOUS  FISTULA;  Surgeon: Cephus Shelling, MD;  Location: [facility] OR;  Service: Vascular;  Laterality: Left;   BASCILIC VEIN TRANSPOSITION Left 08/27/2018   Procedure: Left arm BRACHIOBASILIC VEIN TRANSPOSITION SECOND STAGE;  Surgeon: Cephus Shelling, MD;  Location: [facility] OR;  Service: Vascular;  Laterality: Left;   BUBBLE STUDY  09/03/2020   Procedure: BUBBLE STUDY;  Surgeon: Pricilla Riffle, MD;  Location: [facility] ENDOSCOPY;  Service: Cardiovascular;;   BUBBLE STUDY  06/03/2022   Procedure: BUBBLE STUDY;  Surgeon: Parke Poisson, MD;  Location: [facility] ENDOSCOPY;   Service: Cardiology;;   CAPD INSERTION N/A 05/30/2021   Procedure: LAPAROSCOPIC INSERTION CONTINUOUS AMBULATORY PERITONEAL DIALYSIS  (CAPD) CATHETER;  Surgeon: Leonie Douglas, MD;  Location: [facility] OR;  Service: Vascular;  Laterality: N/A;   CAPD REMOVAL N/A 02/13/2022   Procedure: PERITONEAL DIALYSIS CATHETER REMOVAL;  Surgeon: Leonie Douglas, MD;  Location: [facility] OR;  Service: Vascular;  Laterality: N/A;   CHOLECYSTECTOMY     COLONOSCOPY W/ BIOPSIES AND POLYPECTOMY     GIVENS CAPSULE STUDY N/A 03/01/2018   Procedure: GIVENS CAPSULE STUDY;  Surgeon: Beverley Fiedler, MD;  Location: [facility] ENDOSCOPY;  Service: Gastroenterology;  Laterality: N/A;   HERNIA REPAIR     As baby   LEFT HEART CATH AND CORONARY ANGIOGRAPHY N/A 09/11/2022   Procedure: LEFT HEART CATH AND CORONARY ANGIOGRAPHY;  Surgeon: Corky Crafts, MD;  Location: [facility] INVASIVE CV LAB;  Service: Cardiovascular;  Laterality: N/A;   MITRAL VALVE REPAIR N/A 08/21/2022   Procedure: MINIMALLY INVASIVE MITRAL VALVE REPAIR USING RING ANNULOPLASTY SIMULUS 30mm;  Surgeon: Eugenio Hoes, MD;  Location: [facility] OR;  Service: Open Heart Surgery;  Laterality: N/A;   MULTIPLE TOOTH EXTRACTIONS     NEPHRECTOMY Left 02/18/2021   NOSE SURGERY     RENAL BIOPSY     RIGHT/LEFT HEART CATH AND CORONARY ANGIOGRAPHY N/A  09/04/2021   Procedure: RIGHT/LEFT HEART CATH AND CORONARY ANGIOGRAPHY;  Surgeon: Kathleene Hazel, MD;  Location: [facility] INVASIVE CV LAB;  Service: Cardiovascular;  Laterality: N/A;   RIGHT/LEFT HEART CATH AND CORONARY ANGIOGRAPHY N/A 08/01/2022   Procedure: RIGHT/LEFT HEART CATH AND CORONARY ANGIOGRAPHY;  Surgeon: Tonny Bollman, MD;  Location: [facility] INVASIVE CV LAB;  Service: Cardiovascular;  Laterality: N/A;   TEE WITHOUT CARDIOVERSION N/A 09/03/2020   Procedure: TRANSESOPHAGEAL ECHOCARDIOGRAM (TEE);  Surgeon: Pricilla Riffle, MD;  Location: [facility] ENDOSCOPY;  Service: Cardiovascular;  Laterality: N/A;   TEE WITHOUT CARDIOVERSION N/A 06/03/2022   Procedure:  TRANSESOPHAGEAL ECHOCARDIOGRAM (TEE);  Surgeon: Parke Poisson, MD;  Location: [facility] ENDOSCOPY;  Service: Cardiology;  Laterality: N/A;   TEE WITHOUT CARDIOVERSION N/A 08/21/2022   Procedure: TRANSESOPHAGEAL ECHOCARDIOGRAM (TEE);  Surgeon: Eugenio Hoes, MD;  [facility] OR;  Service: Open Heart Surgery;  Laterality: N/A;   UPPER GI ENDOSCOPY  04/05/2021    Family History  Problem Relation Age of Onset   Hypertension Mother    Heart failure Mother    Hypertension Father    Alcohol abuse Father    Hypertension Sister    Multiple sclerosis Sister    Hypertension Sister    Hypertension Brother    Heart attack Maternal Grandmother 48   Scoliosis Other    Allergic rhinitis Neg Hx    Angioedema Neg Hx    Asthma Neg Hx    Eczema Neg Hx    Immunodeficiency Neg Hx    Urticaria Neg Hx    Colon cancer Neg Hx    Pancreatic cancer Neg Hx    Esophageal cancer Neg Hx    Social History:  reports that he quit smoking about 24 years ago. His smoking use included cigarettes. He started smoking about 42 years ago. He has a 18 pack-year smoking history. He has never used smokeless tobacco. He reports that he does not currently use alcohol. He reports that he does not use drugs.  Allergies:  Allergies  Allergen Reactions   Ace Inhibitors Cough and Other (See Comments)    Medications Prior to Admission  Medication Sig Dispense Refill   acetaminophen (ACETAMINOPHEN 8 HOUR) 650 MG CR tablet Take 1,300 mg by mouth every 8 (eight) hours as needed for pain.     allopurinol (ZYLOPRIM) 100 MG tablet Take 100 mg by mouth daily.     amLODipine (NORVASC) 2.5 MG tablet Take 1 tablet (2.5 mg total) by mouth daily. 90 tablet 3   apixaban (ELIQUIS) 5 MG TABS tablet Take 1 tablet (5 mg total) by mouth 2 (two) times daily. 60 tablet 2   B Complex-C-Zn-Folic Acid (DIALYVITE 800-ZINC 15 PO) Take 1 tablet by mouth daily.     BIKTARVY 50-200-25 MG TABS tablet TAKE ONE TABLET BY MOUTH EVERY DAY 30 tablet 5    cetirizine (ZYRTEC) 10 MG tablet Take 1 tablet (10 mg total) by mouth daily. 30 tablet 0   Doxercalciferol (HECTOROL IV) as directed. Dialysis     famotidine (PEPCID) 20 MG tablet Take 1 tablet (20 mg total) by mouth 2 (two) times daily. 180 tablet 3   ferric citrate (AURYXIA) 1 GM 210 MG(Fe) tablet Take 420 mg by mouth See admin instructions. Take 420 mg by mouth three times a day with meals and 210 mg with each snack     heparin sodium, porcine, 1000 UNIT/ML injection Inject 1,000 Units into the vein once.     levothyroxine (SYNTHROID) 25 MCG tablet take one tablet  by MOUTH daily BEFORE breakfast 90 tablet 1   lubiprostone (AMITIZA) 24 MCG capsule Take 1 capsule (24 mcg total) by mouth 2 (two) times daily with a meal. NEEDS OFFICE VISIT FOR FURTHER REFILLS 60 capsule 11   Methoxy PEG-Epoetin Beta (MIRCERA IJ) Inject 1 each into the skin every 30 (thirty) days. Administered at dialysis center once a month     metoprolol tartrate (LOPRESSOR) 25 MG tablet Take 1 tablet (25 mg total) by mouth 2 (two) times daily. 90 tablet 3   pantoprazole (PROTONIX) 40 MG tablet Take 1 tablet (40 mg total) by mouth daily. 30 tablet 5   rOPINIRole (REQUIP) 1 MG tablet TAKE ONE TABLET BY MOUTH AT BEDTIME 90 tablet 2   rosuvastatin (CRESTOR) 10 MG tablet Take 10 mg by mouth daily.     SENSIPAR 60 MG tablet Take 60 mg by mouth daily.     SYMBICORT 160-4.5 MCG/ACT inhaler Inhale 2 puffs into the lungs in the morning and at bedtime. 10.2 g 5   Vitamin D, Ergocalciferol, (DRISDOL) 1.25 MG (50000 UNIT) CAPS capsule Take 1 capsule (50,000 Units total) by mouth every Tuesday. 12 capsule 0   azelastine (ASTELIN) 0.1 % nasal spray Place 2 sprays into both nostrils 2 (two) times daily as needed for allergies. 30 mL 0   diclofenac Sodium (VOLTAREN) 1 % GEL Apply 2 g topically 4 (four) times daily. Apply to chest pain when lidocaine patch not in place (Patient taking differently: Apply 2 g topically 4 (four) times daily as needed  (pain).) 150 g 0   fluticasone (FLOVENT HFA) 110 MCG/ACT inhaler Inhale 2 puffs into the lungs 2 (two) times daily as needed ("for flares").     gabapentin (NEURONTIN) 300 MG capsule Take 1 capsule (300 mg total) by mouth daily as needed (pain/neuropathy).     ipratropium (ATROVENT) 0.06 % nasal spray USE TWO SPRAYS IN EACH NOSTRIL two TO THREE times daily as needed 15 mL 5   lactulose (CHRONULAC) 10 GM/15ML solution Take 45 mLs (30 g total) by mouth daily as needed. Take 15-30 ml up to twice daily for constipation (Patient taking differently: Take 40 g by mouth daily as needed.) 1800 mL 11   levalbuterol (XOPENEX HFA) 45 MCG/ACT inhaler Inhale TWO puffs by MOUTH every FOUR (four) hours as needed FOR wheezing.] 15 g 1   Olopatadine HCl (PATADAY) 0.7 % SOLN Place 1 drop into both eyes 2 (two) times daily as needed (allergies). 2.5 mL 5   Spacer/Aero-Holding Chambers DEVI 1 Device by Does not apply route 2 (two) times a day. 1 each 1    Results for orders placed or performed during the hospital encounter of 07/06/23 (from the past 48 hour(s))  I-STAT, chem 8     Status: Abnormal   Collection Time: 07/06/23 12:28 PM  Result Value Ref Range   Sodium 137 135 - 145 mmol/L   Potassium 3.3 (L) 3.5 - 5.1 mmol/L   Chloride 100 98 - 111 mmol/L   BUN 24 (H) 6 - 20 mg/dL   Creatinine, Ser 1.61 (H) 0.61 - 1.24 mg/dL   Glucose, Bld 096 (H) 70 - 99 mg/dL    Comment: Glucose reference range applies only to samples taken after fasting for at least 8 hours.   Calcium, Ion 1.06 (L) 1.15 - 1.40 mmol/L   TCO2 24 22 - 32 mmol/L   Hemoglobin 11.9 (L) 13.0 - 17.0 g/dL   HCT 04.5 (L) 40.9 - 81.1 %   No  results found.  Review of Systems  All other systems reviewed and are negative.   Blood pressure 122/86, pulse 86, temperature 98.1 F (36.7 C), temperature source Oral, resp. rate 20, height 5\' 10"  (1.778 m), weight 90.7 kg, SpO2 98%. Physical Exam Constitutional:      Appearance: Normal appearance. He is  normal weight.  HENT:     Head: Normocephalic and atraumatic.     Right Ear: External ear normal.     Left Ear: External ear normal.     Nose: Nose normal.     Mouth/Throat:     Mouth: Mucous membranes are moist.     Pharynx: Oropharynx is clear.  Eyes:     Extraocular Movements: Extraocular movements intact.     Conjunctiva/sclera: Conjunctivae normal.     Pupils: Pupils are equal, round, and reactive to light.  Cardiovascular:     Rate and Rhythm: Normal rate.  Pulmonary:     Effort: Pulmonary effort is normal.  Musculoskeletal:     Cervical back: Normal range of motion.  Skin:    General: Skin is warm and dry.  Neurological:     General: No focal deficit present.     Mental Status: He is alert and oriented to person, place, and time.  Psychiatric:        Mood and Affect: Mood normal.        Behavior: Behavior normal.        Thought Content: Thought content normal.        Judgment: Judgment normal.      Assessment/Plan Obstructive sleep apnea and BMI 28.70  To OR for sleep endoscopy.  Christia Reading, MD 07/06/2023, 1:02 PM

## 2023-07-06 NOTE — Brief Op Note (Signed)
07/06/2023  2:12 PM  PATIENT:  Albert Eaton  57 y.o. male  PRE-OPERATIVE DIAGNOSIS:  Obstructive sleep apnea syndrome BMI 30.0-30.9, Adult  POST-OPERATIVE DIAGNOSIS:  Obstructive sleep apnea syndrome BMI 30.0-30.9, Adult  PROCEDURE:  Procedure(s): DRUG INDUCED SLEEP ENDOSCOPY (Bilateral)  SURGEON:  Surgeons and Role:    Christia Reading, MD - Primary  PHYSICIAN ASSISTANT:   ASSISTANTS: none   ANESTHESIA:   IV sedation  EBL:  None   BLOOD ADMINISTERED:none  DRAINS: none   LOCAL MEDICATIONS USED:  NONE  SPECIMEN:  No Specimen  DISPOSITION OF SPECIMEN:  N/A  COUNTS:  YES  TOURNIQUET:  * No tourniquets in log *  DICTATION: .Note written in EPIC  PLAN OF CARE: Discharge to home after PACU  PATIENT DISPOSITION:  PACU - hemodynamically stable.   Delay start of Pharmacological VTE agent (>24hrs) due to surgical blood loss or risk of bleeding: no

## 2023-07-06 NOTE — Transfer of Care (Signed)
Immediate Anesthesia Transfer of Care Note  Patient: Albert Eaton  Procedure(s) Performed: DRUG INDUCED SLEEP ENDOSCOPY (Bilateral)  Patient Location: PACU  Anesthesia Type:MAC  Level of Consciousness: awake  Airway & Oxygen Therapy: Patient Spontanous Breathing  Post-op Assessment: Report given to RN, Post -op Vital signs reviewed and stable, and Patient moving all extremities X 4  Post vital signs: Reviewed and stable  Last Vitals:  Vitals Value Taken Time  BP 105/77 07/06/23 1417  Temp 36.9 C 07/06/23 1417  Pulse 78 07/06/23 1425  Resp 16 07/06/23 1425  SpO2 99 % 07/06/23 1425  Vitals shown include unfiled device data.  Last Pain:  Vitals:   07/06/23 1417  TempSrc:   PainSc: 0-No pain      Patients Stated Pain Goal: 0 (07/06/23 1237)  Complications: No notable events documented.

## 2023-07-07 ENCOUNTER — Encounter (HOSPITAL_COMMUNITY): Payer: Self-pay | Admitting: Otolaryngology

## 2023-07-07 ENCOUNTER — Ambulatory Visit (INDEPENDENT_AMBULATORY_CARE_PROVIDER_SITE_OTHER)
Admission: RE | Admit: 2023-07-07 | Discharge: 2023-07-07 | Disposition: A | Discharge status: Home / Self Care | Payer: Medicare PPO | Source: Ambulatory Visit | Attending: Nurse Practitioner | Admitting: Nurse Practitioner

## 2023-07-07 ENCOUNTER — Ambulatory Visit (INDEPENDENT_AMBULATORY_CARE_PROVIDER_SITE_OTHER): Payer: Medicare PPO | Admitting: Nurse Practitioner

## 2023-07-07 VITALS — BP 140/90 | HR 84 | Temp 97.6°F | Ht 70.0 in | Wt 211.0 lb

## 2023-07-07 DIAGNOSIS — M549 Dorsalgia, unspecified: Secondary | ICD-10-CM | POA: Diagnosis not present

## 2023-07-07 DIAGNOSIS — M545 Low back pain, unspecified: Secondary | ICD-10-CM | POA: Diagnosis not present

## 2023-07-07 DIAGNOSIS — B20 Human immunodeficiency virus [HIV] disease: Secondary | ICD-10-CM | POA: Diagnosis not present

## 2023-07-07 DIAGNOSIS — N186 End stage renal disease: Secondary | ICD-10-CM | POA: Diagnosis not present

## 2023-07-07 DIAGNOSIS — Z992 Dependence on renal dialysis: Secondary | ICD-10-CM | POA: Diagnosis not present

## 2023-07-07 DIAGNOSIS — G4733 Obstructive sleep apnea (adult) (pediatric): Secondary | ICD-10-CM | POA: Diagnosis not present

## 2023-07-07 MED ORDER — TIZANIDINE HCL 2 MG PO TABS
2.0000 mg | ORAL_TABLET | Freq: Two times a day (BID) | ORAL | 0 refills | Status: DC | PRN
Start: 2023-07-07 — End: 2024-06-16

## 2023-07-07 NOTE — Assessment & Plan Note (Signed)
Patient currently maintained on Biktarvy and followed through infectious disease clinic.  Continue taking medication as prescribed and follow-up with ID clinic as recommended

## 2023-07-07 NOTE — Assessment & Plan Note (Signed)
Patient currently followed by Lanora Manis up to nephrology and is Monday Wednesday Friday dialysis.  Patient is also being worked up for possibility of a kidney transplant.

## 2023-07-07 NOTE — Progress Notes (Signed)
Established Patient Office Visit  Subjective   Patient ID: Albert Eaton, male    DOB: [birth year]  Age: 57 y.o. MRN: [id number]  Chief Complaint  Patient presents with   Follow-up    Pt complains of lower back pain for about a month. Dull pain but when moving pain is sharp.     HPI  Renal failure: patient is currently followed by Bufford Buttner through nephrology. Being evaluated for a transplant. MWF  Heart disease: Patient currently followed by cardiology he is on apixaban, metoprolol.  HIV: Patient is followed by infectious disease and currently on Biktarvy.  States he has labs for ID scheduled for tomorrow.  OSA: Patient recently underwent sedation endoscopy for procedure clearance in regards to inspire device.  Back pain: states that he noticed it approx one month ago. No injury that he is aware of. States that it is a steady pain that is all the time and worse with certain movement. States that he has tried tylenol and heating pad. States that he has been seeing chiropractor that did not help. Heat may have helped the most .     Review of Systems  Constitutional:  Negative for chills and fever.  Respiratory:  Negative for shortness of breath.   Cardiovascular:  Negative for chest pain.  Gastrointestinal:        BM every other day    Genitourinary:        Does not make urine   Musculoskeletal:  Positive for back pain.  Neurological:  Negative for tingling and headaches.      Objective:     BP (!) 140/90   Pulse 84   Temp 97.6 F (36.4 C) (Temporal)   Ht 5\' 10"  (1.778 m)   Wt 211 lb (95.7 kg)   SpO2 96%   BMI 30.28 kg/m    Physical Exam Vitals and nursing note reviewed.  Constitutional:      Appearance: Normal appearance.  Cardiovascular:     Rate and Rhythm: Normal rate and regular rhythm.     Heart sounds: Normal heart sounds.  Pulmonary:     Effort: Pulmonary effort is normal.     Breath sounds: Normal breath sounds.  Musculoskeletal:      Lumbar back: Tenderness present. No bony tenderness. Negative right straight leg raise test and negative left straight leg raise test.       Back:     Right lower leg: No edema.     Left lower leg: No edema.     Comments: Worse with flexion of the lumbar spine  Neurological:     Mental Status: He is alert. Mental status is at baseline.     Deep Tendon Reflexes:     Reflex Scores:      Patellar reflexes are 2+ on the right side and 2+ on the left side.    Comments: Bilateral lower extremity strength 5/5      No results found for any visits on 07/07/23.    The ASCVD Risk score (Arnett DK, et al., 2019) failed to calculate for the following reasons:   The patient has a prior MI or stroke diagnosis    Assessment & Plan:   Problem List Items Addressed This Visit       Respiratory   Obstructive sleep apnea    Patient currently being worked up in order to qualify for the inspire device.  Continue following with specialist as recommended continuing CPAP for the time being  Genitourinary   ESRD (end stage renal disease) on dialysis [facility])    Patient currently followed by Lanora Manis up to nephrology and is Monday Wednesday Friday dialysis.  Patient is also being worked up for possibility of a kidney transplant.        Other   HIV disease (HCC) (Chronic)    Patient currently maintained on Biktarvy and followed through infectious disease clinic.  Continue taking medication as prescribed and follow-up with ID clinic as recommended      Lumbar back pain - Primary    History of the same has been treated with muscle relaxants in the past will obtain lumbar picture as an ongoing issue.  Also ambulatory referral to physical therapy will do tizanidine 2 mg twice daily as needed.  Sedation precautions reviewed      Relevant Medications   tiZANidine (ZANAFLEX) 2 MG tablet   Other Relevant Orders   DG Lumbar Spine Complete   Ambulatory referral to Physical Therapy    Return in  about 6 months (around 01/04/2024) for CPE and Labs.    Audria Nine, NP

## 2023-07-07 NOTE — Anesthesia Postprocedure Evaluation (Signed)
Anesthesia Post Note  Patient: Albert Eaton  Procedure(s) Performed: DRUG INDUCED SLEEP ENDOSCOPY (Bilateral)     Patient location during evaluation: PACU Anesthesia Type: MAC Level of consciousness: awake and alert Pain management: pain level controlled Vital Signs Assessment: post-procedure vital signs reviewed and stable Respiratory status: spontaneous breathing, nonlabored ventilation, respiratory function stable and patient connected to nasal cannula oxygen Cardiovascular status: stable and blood pressure returned to baseline Postop Assessment: no apparent nausea or vomiting Anesthetic complications: no   No notable events documented.  Last Vitals:  Vitals:   07/06/23 1430 07/06/23 1440  BP: 121/86 136/88  Pulse: 74 73  Resp: 19 18  Temp:  36.9 C  SpO2: 97% 99%    Last Pain:  Vitals:   07/06/23 1440  TempSrc:   PainSc: 0-No pain                 Sunol Nation

## 2023-07-07 NOTE — Assessment & Plan Note (Signed)
Patient currently being worked up in order to qualify for the inspire device.  Continue following with specialist as recommended continuing CPAP for the time being

## 2023-07-07 NOTE — Patient Instructions (Signed)
Nice to see you today Let me know if your back does not heal up  Follow up with me in 6 months, sooner if you need me

## 2023-07-07 NOTE — Assessment & Plan Note (Signed)
History of the same has been treated with muscle relaxants in the past will obtain lumbar picture as an ongoing issue.  Also ambulatory referral to physical therapy will do tizanidine 2 mg twice daily as needed.  Sedation precautions reviewed

## 2023-07-08 DIAGNOSIS — Z7682 Awaiting organ transplant status: Secondary | ICD-10-CM | POA: Diagnosis not present

## 2023-07-08 DIAGNOSIS — N186 End stage renal disease: Secondary | ICD-10-CM | POA: Diagnosis not present

## 2023-07-08 DIAGNOSIS — Z992 Dependence on renal dialysis: Secondary | ICD-10-CM | POA: Diagnosis not present

## 2023-07-08 DIAGNOSIS — N2581 Secondary hyperparathyroidism of renal origin: Secondary | ICD-10-CM | POA: Diagnosis not present

## 2023-07-09 ENCOUNTER — Other Ambulatory Visit: Payer: Self-pay

## 2023-07-09 ENCOUNTER — Ambulatory Visit (INDEPENDENT_AMBULATORY_CARE_PROVIDER_SITE_OTHER): Payer: Medicare PPO

## 2023-07-09 ENCOUNTER — Other Ambulatory Visit: Payer: Medicare PPO

## 2023-07-09 DIAGNOSIS — B20 Human immunodeficiency virus [HIV] disease: Secondary | ICD-10-CM | POA: Diagnosis not present

## 2023-07-09 DIAGNOSIS — Z23 Encounter for immunization: Secondary | ICD-10-CM | POA: Diagnosis not present

## 2023-07-09 DIAGNOSIS — Z113 Encounter for screening for infections with a predominantly sexual mode of transmission: Secondary | ICD-10-CM

## 2023-07-09 DIAGNOSIS — M9905 Segmental and somatic dysfunction of pelvic region: Secondary | ICD-10-CM | POA: Diagnosis not present

## 2023-07-09 DIAGNOSIS — M9904 Segmental and somatic dysfunction of sacral region: Secondary | ICD-10-CM | POA: Diagnosis not present

## 2023-07-09 DIAGNOSIS — M9903 Segmental and somatic dysfunction of lumbar region: Secondary | ICD-10-CM | POA: Diagnosis not present

## 2023-07-09 DIAGNOSIS — M5136 Other intervertebral disc degeneration, lumbar region: Secondary | ICD-10-CM | POA: Diagnosis not present

## 2023-07-10 DIAGNOSIS — N186 End stage renal disease: Secondary | ICD-10-CM | POA: Diagnosis not present

## 2023-07-10 DIAGNOSIS — Z992 Dependence on renal dialysis: Secondary | ICD-10-CM | POA: Diagnosis not present

## 2023-07-10 DIAGNOSIS — N2581 Secondary hyperparathyroidism of renal origin: Secondary | ICD-10-CM | POA: Diagnosis not present

## 2023-07-10 LAB — T-HELPER CELL (CD4) - (RCID CLINIC ONLY)
CD4 % Helper T Cell: 40 % (ref 33–65)
CD4 T Cell Abs: 860 /uL (ref 400–1790)

## 2023-07-11 LAB — CBC WITH DIFFERENTIAL/PLATELET
Absolute Monocytes: 532 {cells}/uL (ref 200–950)
Basophils Absolute: 39 {cells}/uL (ref 0–200)
Basophils Relative: 0.7 %
Eosinophils Absolute: 314 {cells}/uL (ref 15–500)
Eosinophils Relative: 5.6 %
HCT: 34.3 % — ABNORMAL LOW (ref 38.5–50.0)
Hemoglobin: 12 g/dL — ABNORMAL LOW (ref 13.2–17.1)
Lymphs Abs: 2374 {cells}/uL (ref 850–3900)
MCH: 33 pg (ref 27.0–33.0)
MCHC: 35 g/dL (ref 32.0–36.0)
MCV: 94.2 fL (ref 80.0–100.0)
MPV: 10.2 fL (ref 7.5–12.5)
Monocytes Relative: 9.5 %
Neutro Abs: 2341 {cells}/uL (ref 1500–7800)
Neutrophils Relative %: 41.8 %
Platelets: 315 10*3/uL (ref 140–400)
RBC: 3.64 10*6/uL — ABNORMAL LOW (ref 4.20–5.80)
RDW: 13.9 % (ref 11.0–15.0)
Total Lymphocyte: 42.4 %
WBC: 5.6 10*3/uL (ref 3.8–10.8)

## 2023-07-11 LAB — COMPLETE METABOLIC PANEL WITH GFR
AG Ratio: 1.4 (calc) (ref 1.0–2.5)
ALT: 18 U/L (ref 9–46)
AST: 14 U/L (ref 10–35)
Albumin: 4.9 g/dL (ref 3.6–5.1)
Alkaline phosphatase (APISO): 198 U/L — ABNORMAL HIGH (ref 35–144)
BUN/Creatinine Ratio: 3 (calc) — ABNORMAL LOW (ref 6–22)
BUN: 30 mg/dL — ABNORMAL HIGH (ref 7–25)
CO2: 28 mmol/L (ref 20–32)
Calcium: 10.9 mg/dL — ABNORMAL HIGH (ref 8.6–10.3)
Chloride: 94 mmol/L — ABNORMAL LOW (ref 98–110)
Creat: 9.27 mg/dL — ABNORMAL HIGH (ref 0.70–1.30)
Globulin: 3.4 g/dL (ref 1.9–3.7)
Glucose, Bld: 107 mg/dL — ABNORMAL HIGH (ref 65–99)
Potassium: 3.9 mmol/L (ref 3.5–5.3)
Sodium: 137 mmol/L (ref 135–146)
Total Bilirubin: 0.4 mg/dL (ref 0.2–1.2)
Total Protein: 8.3 g/dL — ABNORMAL HIGH (ref 6.1–8.1)
eGFR: 6 mL/min/{1.73_m2} — ABNORMAL LOW (ref >=60)

## 2023-07-11 LAB — HIV-1 RNA QUANT-NO REFLEX-BLD
HIV 1 RNA Quant: <20 {copies}/mL — ABNORMAL HIGH
HIV-1 RNA Quant, Log: <1.3 {Log_copies}/mL — ABNORMAL HIGH

## 2023-07-11 LAB — RPR: RPR Ser Ql: NONREACTIVE

## 2023-07-13 ENCOUNTER — Ambulatory Visit (INDEPENDENT_AMBULATORY_CARE_PROVIDER_SITE_OTHER): Payer: Medicare PPO

## 2023-07-13 VITALS — Ht 70.0 in | Wt 210.0 lb

## 2023-07-13 DIAGNOSIS — N186 End stage renal disease: Secondary | ICD-10-CM | POA: Diagnosis not present

## 2023-07-13 DIAGNOSIS — N2581 Secondary hyperparathyroidism of renal origin: Secondary | ICD-10-CM | POA: Diagnosis not present

## 2023-07-13 DIAGNOSIS — M9904 Segmental and somatic dysfunction of sacral region: Secondary | ICD-10-CM | POA: Diagnosis not present

## 2023-07-13 DIAGNOSIS — M9905 Segmental and somatic dysfunction of pelvic region: Secondary | ICD-10-CM | POA: Diagnosis not present

## 2023-07-13 DIAGNOSIS — M5136 Other intervertebral disc degeneration, lumbar region: Secondary | ICD-10-CM | POA: Diagnosis not present

## 2023-07-13 DIAGNOSIS — M9903 Segmental and somatic dysfunction of lumbar region: Secondary | ICD-10-CM | POA: Diagnosis not present

## 2023-07-13 DIAGNOSIS — Z0000 Encounter for general adult medical examination without abnormal findings: Secondary | ICD-10-CM

## 2023-07-13 DIAGNOSIS — Z992 Dependence on renal dialysis: Secondary | ICD-10-CM | POA: Diagnosis not present

## 2023-07-13 NOTE — Patient Instructions (Signed)
Albert Eaton , Thank you for taking time to come for your Medicare Wellness Visit. I appreciate your ongoing commitment to your health goals. Please review the following plan we discussed and let me know if I can assist you in the future.   Referrals/Orders/Follow-Ups/Clinician Recommendations: Aim for 30 minutes of exercise or brisk walking, 6-8 glasses of water, and 5 servings of fruits and vegetables each day.   This is a list of the screening recommended for you and due dates:  Health Maintenance  Topic Date Due   Medicare Annual Wellness Visit  07/09/2023   COVID-19 Vaccine (10 - 2023-24 season) 09/03/2023   Colon Cancer Screening  05/02/2026   DTaP/Tdap/Td vaccine (2 - Td or Tdap) 01/28/2027   Hepatitis C Screening  Completed   HIV Screening  Completed   Zoster (Shingles) Vaccine  Completed   HPV Vaccine  Aged Out    Advanced directives: (Declined) Advance directive discussed with you today. Even though you declined this today, please call our office should you change your mind, and we can give you the proper paperwork for you to fill out.  Next Medicare Annual Wellness Visit scheduled for next year: Yes

## 2023-07-13 NOTE — Progress Notes (Signed)
Subjective:   Albert Eaton is a 57 y.o. male who presents for Medicare Annual/Subsequent preventive examination.  Visit Complete: Virtual  I connected with  Estefan Gilleland Maull on 07/13/23 by a audio enabled telemedicine application and verified that I am speaking with the correct person using two identifiers.  Patient Location: Home  Provider Location: Home Office  I discussed the limitations of evaluation and management by telemedicine. The patient expressed understanding and agreed to proceed.  Vital Signs: Because this visit was a virtual/telehealth visit, some criteria may be missing or patient reported. Any vitals not documented were not able to be obtained and vitals that have been documented are patient reported.    Cardiac Risk Factors include: advanced age (>43men, >17 women);hypertension;sedentary lifestyle;male gender     Objective:    Today's Vitals   07/13/23 0848  Weight: 210 lb (95.3 kg)  Height: 5\' 10"  (1.778 m)   Body mass index is 30.13 kg/m.     07/13/2023    9:02 AM 07/06/2023   12:30 PM 10/29/2022    6:50 PM 09/08/2022   11:37 AM 08/29/2022    2:58 PM 08/21/2022    3:00 PM 08/21/2022    6:18 AM  Advanced Directives  Does Patient Have a Medical Advance Directive? No No No No No No No  Would patient like information on creating a medical advance directive? No - Patient declined   No - Patient declined No - Patient declined No - Patient declined No - Patient declined    Current Medications (verified) Outpatient Encounter Medications as of 07/13/2023  Medication Sig   acetaminophen (ACETAMINOPHEN 8 HOUR) 650 MG CR tablet Take 1,300 mg by mouth every 8 (eight) hours as needed for pain.   allopurinol (ZYLOPRIM) 100 MG tablet Take 100 mg by mouth daily.   amLODipine (NORVASC) 2.5 MG tablet Take 1 tablet (2.5 mg total) by mouth daily.   apixaban (ELIQUIS) 5 MG TABS tablet Take 1 tablet (5 mg total) by mouth 2 (two) times daily.   azelastine (ASTELIN) 0.1 %  nasal spray Place 2 sprays into both nostrils 2 (two) times daily as needed for allergies.   B Complex-C-Zn-Folic Acid (DIALYVITE 800-ZINC 15 PO) Take 1 tablet by mouth daily.   BIKTARVY 50-200-25 MG TABS tablet TAKE ONE TABLET BY MOUTH EVERY DAY   cetirizine (ZYRTEC) 10 MG tablet Take 1 tablet (10 mg total) by mouth daily.   diclofenac Sodium (VOLTAREN) 1 % GEL Apply 2 g topically 4 (four) times daily. Apply to chest pain when lidocaine patch not in place (Patient taking differently: Apply 2 g topically 4 (four) times daily as needed (pain).)   Doxercalciferol (HECTOROL IV) as directed. Dialysis   famotidine (PEPCID) 20 MG tablet Take 1 tablet (20 mg total) by mouth 2 (two) times daily.   ferric citrate (AURYXIA) 1 GM 210 MG(Fe) tablet Take 420 mg by mouth See admin instructions. Take 420 mg by mouth three times a day with meals and 210 mg with each snack   fluticasone (FLOVENT HFA) 110 MCG/ACT inhaler Inhale 2 puffs into the lungs 2 (two) times daily as needed ("for flares").   gabapentin (NEURONTIN) 300 MG capsule Take 1 capsule (300 mg total) by mouth daily as needed (pain/neuropathy).   heparin sodium, porcine, 1000 UNIT/ML injection Inject 1,000 Units into the vein once.   ipratropium (ATROVENT) 0.06 % nasal spray USE TWO SPRAYS IN EACH NOSTRIL two TO THREE times daily as needed   lactulose (CHRONULAC) 10 GM/15ML  solution Take 45 mLs (30 g total) by mouth daily as needed. Take 15-30 ml up to twice daily for constipation (Patient taking differently: Take 40 g by mouth daily as needed.)   levalbuterol (XOPENEX HFA) 45 MCG/ACT inhaler Inhale TWO puffs by MOUTH every FOUR (four) hours as needed FOR wheezing.]   levothyroxine (SYNTHROID) 25 MCG tablet take one tablet by MOUTH daily BEFORE breakfast   lubiprostone (AMITIZA) 24 MCG capsule Take 1 capsule (24 mcg total) by mouth 2 (two) times daily with a meal. NEEDS OFFICE VISIT FOR FURTHER REFILLS   Methoxy PEG-Epoetin Beta (MIRCERA IJ) Inject 1 each  into the skin every 30 (thirty) days. Administered at dialysis center once a month   metoprolol tartrate (LOPRESSOR) 25 MG tablet Take 1 tablet (25 mg total) by mouth 2 (two) times daily.   Olopatadine HCl (PATADAY) 0.7 % SOLN Place 1 drop into both eyes 2 (two) times daily as needed (allergies).   pantoprazole (PROTONIX) 40 MG tablet Take 1 tablet (40 mg total) by mouth daily.   rOPINIRole (REQUIP) 1 MG tablet TAKE ONE TABLET BY MOUTH AT BEDTIME   rosuvastatin (CRESTOR) 10 MG tablet Take 10 mg by mouth daily.   SENSIPAR 60 MG tablet Take 60 mg by mouth daily.   sevelamer carbonate (RENVELA) 800 MG tablet Take 2,400 mg by mouth 3 (three) times daily.   Spacer/Aero-Holding Chambers DEVI 1 Device by Does not apply route 2 (two) times a day.   SYMBICORT 160-4.5 MCG/ACT inhaler Inhale 2 puffs into the lungs in the morning and at bedtime.   tiZANidine (ZANAFLEX) 2 MG tablet Take 1 tablet (2 mg total) by mouth 2 (two) times daily as needed for muscle spasms.   Vitamin D, Ergocalciferol, (DRISDOL) 1.25 MG (50000 UNIT) CAPS capsule Take 1 capsule (50,000 Units total) by mouth every Tuesday.   No facility-administered encounter medications on file as of 07/13/2023.    Allergies (verified) Ace inhibitors   History: Past Medical History:  Diagnosis Date   Anemia    Aortic atherosclerosis (HCC)    Arthritis    Ascending aorta dilation (HCC) 02/17/2023   Limited TTE 02/17/23: no effusion, s/p MV repair w trivial residual MR, asc aorta 39 mm, mod TR, severe LAE   Asthma    Cancer (HCC)    renal cyst   Chronic diastolic CHF (congestive heart failure) (HCC) 01/06/2017   Echo 7/16 [facility] in [place], [place]) Mild AI, mild LAE, mild concentric LVH, EF 55, normal wall motion, mild to moderate MR, mild PI, RVSP 55 // Echo 10/09/16 ([facility]):  Moderate LVH, grade 2 diastolic dysfunction, mild MR, moderate LAE    Dysrhythmia    paroxysmal afib/flutter   Esophagitis    ESRD (end stage renal disease) on  dialysis [facility])    Dialysis Mon Wed Fri   GERD (gastroesophageal reflux disease)    Gout    no current problems   Hepatitis    Hep B   HIV (human immunodeficiency virus infection) (HCC)    HLD (hyperlipidemia)    Hypertension    Hypothyroidism    Mitral regurgitation    Neuropathy    Sleep apnea    uses c-pap   Wears glasses    Wears partial dentures    Past Surgical History:  Procedure Laterality Date   AV FISTULA PLACEMENT Left 07/02/2018   Procedure: Creation of Left arm BRACHIOBASILIC ARTERIOVENOUS  FISTULA;  Surgeon: Cephus Shelling, MD;  Location[facility] OR;  Service: Vascular;  Laterality:  Left;   BASCILIC VEIN TRANSPOSITION Left 08/27/2018   Procedure: Left arm BRACHIOBASILIC VEIN TRANSPOSITION SECOND STAGE;  Surgeon: Cephus Shelling, MD;  Location: [facility] OR;  Service: Vascular;  Laterality: Left;   BUBBLE STUDY  09/03/2020   Procedure: BUBBLE STUDY;  Surgeon: Pricilla Riffle, MD;  Location: [facility] ENDOSCOPY;  Service: Cardiovascular;;   BUBBLE STUDY  06/03/2022   Procedure: BUBBLE STUDY;  Surgeon: Parke Poisson, MD;  Location: [facility] ENDOSCOPY;  Service: Cardiology;;   CAPD INSERTION N/A 05/30/2021   Procedure: LAPAROSCOPIC INSERTION CONTINUOUS AMBULATORY PERITONEAL DIALYSIS  (CAPD) CATHETER;  Surgeon: Leonie Douglas, MD;  Location: [facility] OR;  Service: Vascular;  Laterality: N/A;   CAPD REMOVAL N/A 02/13/2022   Procedure: PERITONEAL DIALYSIS CATHETER REMOVAL;  Surgeon: Leonie Douglas, MD;  Location: [facility] OR;  Service: Vascular;  Laterality: N/A;   CHOLECYSTECTOMY     COLONOSCOPY W/ BIOPSIES AND POLYPECTOMY     DRUG INDUCED ENDOSCOPY Bilateral 07/06/2023   Procedure: DRUG INDUCED SLEEP ENDOSCOPY;  Surgeon: Christia Reading, MD;  Location: [facility] OR;  Service: ENT;  Laterality: Bilateral;   GIVENS CAPSULE STUDY N/A 03/01/2018   Procedure: GIVENS CAPSULE STUDY;  Surgeon: Beverley Fiedler, MD;  Location: [facility] ENDOSCOPY;  Service: Gastroenterology;  Laterality: N/A;   HERNIA REPAIR     As baby    LEFT HEART CATH AND CORONARY ANGIOGRAPHY N/A 09/11/2022   Procedure: LEFT HEART CATH AND CORONARY ANGIOGRAPHY;  Surgeon: Corky Crafts, MD;  Location: [facility] INVASIVE CV LAB;  Service: Cardiovascular;  Laterality: N/A;   MITRAL VALVE REPAIR N/A 08/21/2022   Procedure: MINIMALLY INVASIVE MITRAL VALVE REPAIR USING RING ANNULOPLASTY SIMULUS 30mm;  Surgeon: Eugenio Hoes, MD;  Location: [facility] OR;  Service: Open Heart Surgery;  Laterality: N/A;   MULTIPLE TOOTH EXTRACTIONS     NEPHRECTOMY Left 02/18/2021   NOSE SURGERY     RENAL BIOPSY     RIGHT/LEFT HEART CATH AND CORONARY ANGIOGRAPHY N/A 09/04/2021   Procedure: RIGHT/LEFT HEART CATH AND CORONARY ANGIOGRAPHY;  Surgeon: Kathleene Hazel, MD;  Location: [facility] INVASIVE CV LAB;  Service: Cardiovascular;  Laterality: N/A;   RIGHT/LEFT HEART CATH AND CORONARY ANGIOGRAPHY N/A 08/01/2022   Procedure: RIGHT/LEFT HEART CATH AND CORONARY ANGIOGRAPHY;  Surgeon: Tonny Bollman, MD;  Location: [facility] INVASIVE CV LAB;  Service: Cardiovascular;  Laterality: N/A;   TEE WITHOUT CARDIOVERSION N/A 09/03/2020   Procedure: TRANSESOPHAGEAL ECHOCARDIOGRAM (TEE);  Surgeon: Pricilla Riffle, MD;  Location: [facility] ENDOSCOPY;  Service: Cardiovascular;  Laterality: N/A;   TEE WITHOUT CARDIOVERSION N/A 06/03/2022   Procedure: TRANSESOPHAGEAL ECHOCARDIOGRAM (TEE);  Surgeon: Parke Poisson, MD;  Location: [facility] ENDOSCOPY;  Service: Cardiology;  Laterality: N/A;   TEE WITHOUT CARDIOVERSION N/A 08/21/2022   Procedure: TRANSESOPHAGEAL ECHOCARDIOGRAM (TEE);  Surgeon: Eugenio Hoes, MD;  Location: [facility] OR;  Service: Open Heart Surgery;  Laterality: N/A;   UPPER GI ENDOSCOPY  04/05/2021   Family History  Problem Relation Age of Onset   Hypertension Mother    Heart failure Mother    Hypertension Father    Alcohol abuse Father    Hypertension Sister    Multiple sclerosis Sister    Hypertension Sister    Hypertension Brother    Heart attack Maternal Grandmother 79   Scoliosis Other     Allergic rhinitis Neg Hx    Angioedema Neg Hx    Asthma Neg Hx    Eczema Neg Hx    Immunodeficiency Neg Hx    Urticaria Neg Hx    Colon  cancer Neg Hx    Pancreatic cancer Neg Hx    Esophageal cancer Neg Hx    Social History   Socioeconomic History   Marital status: Single    Spouse name: Not on file   Number of children: 0   Years of education: Not on file   Highest education level: Not on file  Occupational History   Not on file  Tobacco Use   Smoking status: Former    Current packs/day: 0.00    Average packs/day: 1 pack/day for 18.0 years (18.0 ttl pk-yrs)    Types: Cigarettes    Start date: 14    Quit date: 2000    Years since quitting: 24.7   Smokeless tobacco: Never  Vaping Use   Vaping status: Never Used  Substance and Sexual Activity   Alcohol use: Not Currently   Drug use: No   Sexual activity: Not Currently    Partners: Male  Other Topics Concern   Not on file  Social History Narrative   Disability      [place]: none   Social Determinants of Health   Financial Resource Strain: High Risk (07/13/2023)   Overall Financial Resource Strain (CARDIA)    Difficulty of Paying Living Expenses: Hard  Food Insecurity: Food Insecurity Present (07/13/2023)   Hunger Vital Sign    Worried About Running Out of Food in the Last Year: Often true    Ran Out of Food in the Last Year: Never true  Transportation Needs: No Transportation Needs (07/13/2023)   PRAPARE - Administrator, Civil Service (Medical): No    Lack of Transportation (Non-Medical): No  Physical Activity: Inactive (07/13/2023)   Exercise Vital Sign    Days of Exercise per Week: 0 days    Minutes of Exercise per Session: 0 min  Stress: No Stress Concern Present (07/13/2023)   [employer] of Occupational Health - Occupational Stress Questionnaire    Feeling of Stress : Not at all  Social Connections: Socially Isolated (07/13/2023)   Social Connection and Isolation Panel [NHANES]     Frequency of Communication with Friends and Family: More than three times a week    Frequency of Social Gatherings with Friends and Family: More than three times a week    Attends Religious Services: Never    Database administrator or Organizations: No    Attends Engineer, structural: Never    Marital Status: Never married    Tobacco Counseling Counseling given: Not Answered   Clinical Intake:  Pre-visit preparation completed: Yes  Pain : No/denies pain     BMI - recorded: 30.13 Nutritional Status: BMI > 30  Obese Nutritional Risks: None Diabetes: No  How often do you need to have someone help you when you read instructions, pamphlets, or other written materials from your doctor or pharmacy?: 1 - Never  Interpreter Needed?: No  Information entered by :: C.Hassell Patras LPN   Activities of Daily Living    07/13/2023    9:02 AM 07/06/2023   12:45 PM  In your present state of health, do you have any difficulty performing the following activities:  Hearing? 0 0  Vision? 0 1  Comment  Sometimes  Difficulty concentrating or making decisions? 0 0  Walking or climbing stairs? 1 1  Comment due back issue Climbing stairs  Dressing or bathing? 0 0  Doing errands, shopping? 0   Preparing Food and eating ? N   Using the Toilet? N  In the past six months, have you accidently leaked urine? N   Do you have problems with loss of bowel control? N   Managing your Medications? N   Managing your Finances? N   Housekeeping or managing your Housekeeping? N     Patient Care Team: [name], NP as PCP - General (Pain Medicine) Tonny Bollman, MD as PCP - Cardiology (Cardiology) Lanier Prude, MD as PCP - Electrophysiology (Cardiology) Bufford Buttner, MD as Consulting Physician (Nephrology) Bobbitt, Heywood Iles, MD as Consulting Physician (Allergy and Immunology) Janalyn Harder, MD (Inactive) as Consulting Physician (Dermatology) [facility] Janalyn Harder, MD (Inactive) as Consulting Physician (Dermatology)  Indicate any recent Medical Services you may have received from other than [facility] providers in the past year (date may be approximate).     Assessment:   This is a routine wellness examination for Cane.  Hearing/Vision screen Hearing Screening - Comments:: Denies hearing difficulties   Vision Screening - Comments:: Glasses - [facility] - UTD on eye exams   Goals Addressed             This Visit's Progress    Patient Stated       Exercise more       Depression Screen    07/13/2023    8:51 AM 03/05/2023   11:18 AM 01/06/2023    1:53 PM 01/01/2023    8:11 AM 11/25/2022   11:01 AM 07/08/2022    9:39 AM 04/17/2022    4:12 PM  PHQ 2/9 Scores  PHQ - 2 Score 0 0 0 0 0 0 0  PHQ- 9 Score  9  5 2       Fall Risk    07/13/2023    8:56 AM 01/01/2023    8:10 AM 11/25/2022   11:01 AM 07/08/2022    9:44 AM 04/17/2022    4:12 PM  Fall Risk   Falls in the past year? 0 0 1 0 0  Number falls in past yr: 0 0 1 0 0  Injury with Fall? 0 0 1 0 0  Risk for fall due to : No Fall Risks No Fall Risks  No Fall Risks   Follow up Falls prevention discussed;Falls evaluation completed Falls evaluation completed  Falls prevention discussed     MEDICARE RISK AT HOME: Medicare Risk at Home Any stairs in or around the home?: Yes If so, are there any without handrails?: No Home free of loose throw rugs in walkways, pet beds, electrical cords, etc?: Yes Adequate lighting in your home to reduce risk of falls?: Yes Life alert?: Yes Use of a cane, walker or w/c?: No Grab bars in the bathroom?: No Shower chair or bench in shower?: No Elevated toilet seat or a handicapped toilet?: No  TIMED UP AND GO:  Was the test performed?  No    Cognitive Function:        07/13/2023    9:04 AM 07/08/2022    9:47 AM  6CIT Screen  What Year? 0 points 0 points  What month? 0 points 0 points  What time? 0 points 0 points  Count  back from 20 0 points 0 points  Months in reverse 0 points 0 points  Repeat phrase 2 points 0 points  Total Score 2 points 0 points    Immunizations Immunization History  Administered Date(s) Administered   COVID-19, mRNA, vaccine(Comirnaty)12 years and older 08/18/2022, 07/09/2023   Influenza, Seasonal, Injecte, Preservative Fre 07/09/2023  Influenza,inj,Quad PF,6+ Mos 10/10/2016, 08/31/2017, 07/08/2018, 06/23/2019, 07/11/2020, 09/05/2021, 08/07/2022   Influenza-Unspecified 07/08/2018, 05/23/2019   Meningococcal Conjugate 10/15/2016, 08/31/2017   Meningococcal Mcv4o 10/15/2016, 08/31/2017   PFIZER Comirnaty(Gray Top)Covid-19 Tri-Sucrose Vaccine 06/13/2020, 05/28/2021   PFIZER(Purple Top)SARS-COV-2 Vaccination 01/06/2020, 02/01/2020, 06/13/2020   Pfizer Covid-19 Vaccine Bivalent Booster 39yrs & up 09/13/2021, 03/11/2022   Pneumococcal Conjugate-13 12/29/2018   Pneumococcal Polysaccharide-23 10/15/2016, 10/13/2019   Tdap 01/27/2017   Vaccinia,smallpox Monkeypox Vaccine Live,pf 06/06/2021, 07/04/2021   Zoster Recombinant(Shingrix) 06/13/2020, 08/12/2020, 07/27/2021, 08/27/2021    TDAP status: Up to date  Flu Vaccine status: Up to date  Pneumococcal vaccine status: Up to date  Covid-19 vaccine status: Information provided on how to obtain vaccines.   Qualifies for Shingles Vaccine? Yes   Zostavax completed      Shingrix Completed?: Yes  Screening Tests Health Maintenance  Topic Date Due   COVID-19 Vaccine (10 - 2023-24 season) 09/03/2023   Medicare Annual Wellness (AWV)  07/12/2024   Colonoscopy  05/02/2026   DTaP/Tdap/Td (2 - Td or Tdap) 01/28/2027   Hepatitis C Screening  Completed   HIV Screening  Completed   Zoster Vaccines- Shingrix  Completed   HPV VACCINES  Aged Out    Health Maintenance  There are no preventive care reminders to display for this patient.   Colorectal cancer screening: Type of screening: Colonoscopy. Completed 05/02/21. Repeat every 5  years  Lung Cancer Screening: (Low Dose CT Chest recommended if Age 51-80 years, 20 pack-year currently smoking OR have quit w/in 15years.) does not qualify.   Lung Cancer Screening Referral:    Additional Screening:  Hepatitis C Screening: does qualify; Completed 10/15/16  Vision Screening: Recommended annual ophthalmology exams for early detection of glaucoma and other disorders of the eye. Is the patient up to date with their annual eye exam?  Yes  Who is the provider or what is the name of the office in which the patient attends annual eye exams? [facility] If pt is not established with a provider, would they like to be referred to a provider to establish care? Yes .   Dental Screening: Recommended annual dental exams for proper oral hygiene  Diabetic Foot Exam:   Community Resource Referral / Chronic Care Management: CRR required this visit?  No   CCM required this visit?  No     Plan:     I have personally reviewed and noted the following in the patient's chart:   Medical and social history Use of alcohol, tobacco or illicit drugs  Current medications and supplements including opioid prescriptions. Patient is not currently taking opioid prescriptions. Functional ability and status Nutritional status Physical activity Advanced directives List of other physicians Hospitalizations, surgeries, and ER visits in previous 12 months Vitals Screenings to include cognitive, depression, and falls Referrals and appointments  In addition, I have reviewed and discussed with patient certain preventive protocols, quality metrics, and best practice recommendations. A written personalized care plan for preventive services as well as general preventive health recommendations were provided to patient.     Maryan Puls, LPN   [id number]   After Visit Summary: (MyChart) Due to this being a telephonic visit, the after visit summary with patients personalized plan was  offered to patient via MyChart   Nurse Notes: None

## 2023-07-15 DIAGNOSIS — N186 End stage renal disease: Secondary | ICD-10-CM | POA: Diagnosis not present

## 2023-07-15 DIAGNOSIS — N2581 Secondary hyperparathyroidism of renal origin: Secondary | ICD-10-CM | POA: Diagnosis not present

## 2023-07-15 DIAGNOSIS — Z992 Dependence on renal dialysis: Secondary | ICD-10-CM | POA: Diagnosis not present

## 2023-07-16 ENCOUNTER — Ambulatory Visit (HOSPITAL_COMMUNITY)
Admission: EM | Admit: 2023-07-16 | Discharge: 2023-07-16 | Disposition: A | Discharge status: Home / Self Care | Payer: Medicare PPO | Attending: Internal Medicine | Admitting: Internal Medicine

## 2023-07-16 ENCOUNTER — Encounter (HOSPITAL_COMMUNITY): Payer: Self-pay | Admitting: *Deleted

## 2023-07-16 DIAGNOSIS — M9904 Segmental and somatic dysfunction of sacral region: Secondary | ICD-10-CM | POA: Diagnosis not present

## 2023-07-16 DIAGNOSIS — I132 Hypertensive heart and chronic kidney disease with heart failure and with stage 5 chronic kidney disease, or end stage renal disease: Secondary | ICD-10-CM | POA: Diagnosis not present

## 2023-07-16 DIAGNOSIS — Z7901 Long term (current) use of anticoagulants: Secondary | ICD-10-CM | POA: Diagnosis not present

## 2023-07-16 DIAGNOSIS — Z992 Dependence on renal dialysis: Secondary | ICD-10-CM | POA: Diagnosis not present

## 2023-07-16 DIAGNOSIS — J069 Acute upper respiratory infection, unspecified: Secondary | ICD-10-CM | POA: Diagnosis not present

## 2023-07-16 DIAGNOSIS — M5136 Other intervertebral disc degeneration, lumbar region: Secondary | ICD-10-CM | POA: Diagnosis not present

## 2023-07-16 DIAGNOSIS — K5090 Crohn's disease, unspecified, without complications: Secondary | ICD-10-CM | POA: Insufficient documentation

## 2023-07-16 DIAGNOSIS — G4733 Obstructive sleep apnea (adult) (pediatric): Secondary | ICD-10-CM | POA: Insufficient documentation

## 2023-07-16 DIAGNOSIS — N186 End stage renal disease: Secondary | ICD-10-CM | POA: Diagnosis not present

## 2023-07-16 DIAGNOSIS — U071 COVID-19: Secondary | ICD-10-CM | POA: Diagnosis not present

## 2023-07-16 DIAGNOSIS — M9905 Segmental and somatic dysfunction of pelvic region: Secondary | ICD-10-CM | POA: Diagnosis not present

## 2023-07-16 DIAGNOSIS — M9903 Segmental and somatic dysfunction of lumbar region: Secondary | ICD-10-CM | POA: Diagnosis not present

## 2023-07-16 DIAGNOSIS — E039 Hypothyroidism, unspecified: Secondary | ICD-10-CM | POA: Diagnosis not present

## 2023-07-16 DIAGNOSIS — I509 Heart failure, unspecified: Secondary | ICD-10-CM | POA: Insufficient documentation

## 2023-07-16 DIAGNOSIS — Z21 Asymptomatic human immunodeficiency virus [HIV] infection status: Secondary | ICD-10-CM | POA: Diagnosis not present

## 2023-07-16 LAB — SARS CORONAVIRUS 2 (TAT 6-24 HRS): SARS Coronavirus 2: POSITIVE — AB

## 2023-07-16 NOTE — ED Provider Notes (Signed)
[facility]    CSN: [id number] Arrival date & time: 07/16/23  6962      History   Chief Complaint Chief Complaint  Patient presents with   Sore Throat   Headache   Chills    HPI Kruze Volz Hobby is a 57 y.o. male.   57 year old male presents with history of sore throat x 1 day and today woke up with chills, headache, nausea, decreased appetite.  Denies fever or shortness of breath.  Notes headache is low-grade and throughout, denies thunderclap headache.  He has taken Tylenol with notable benefit. Significant past medical history includes HIV (with CD4 count of 860 on Biktarvy), CHF, OSA, hypothyroidism, Crohn's disease, ESRD on HD, atrial flutter on Eliquis.  He has been taking his medications regularly and is adherent to his dialysis schedule.   Sore Throat Associated symptoms include headaches.  Headache Associated symptoms: cough and sore throat   Associated symptoms: no fever       Past Medical History:  Diagnosis Date   Anemia    Aortic atherosclerosis (HCC)    Arthritis    Ascending aorta dilation (HCC) 02/17/2023   Limited TTE 02/17/23: no effusion, s/p MV repair w trivial residual MR, asc aorta 39 mm, mod TR, severe LAE   Asthma    Cancer (HCC)    renal cyst   Chronic diastolic CHF (congestive heart failure) (HCC) 01/06/2017   Echo 7/16 [facility] in [place], [place]) Mild AI, mild LAE, mild concentric LVH, EF 55, normal wall motion, mild to moderate MR, mild PI, RVSP 55 // Echo 10/09/16 ([facility]):  Moderate LVH, grade 2 diastolic dysfunction, mild MR, moderate LAE    Dysrhythmia    paroxysmal afib/flutter   Esophagitis    ESRD (end stage renal disease) on dialysis [facility])    Dialysis Mon Wed Fri   GERD (gastroesophageal reflux disease)    Gout    no current problems   Hepatitis    Hep B   HIV (human immunodeficiency virus infection) (HCC)    HLD (hyperlipidemia)    Hypertension    Hypothyroidism    Mitral regurgitation    Neuropathy     Sleep apnea    uses c-pap   Wears glasses    Wears partial dentures     Patient Active Problem List   Diagnosis Date Noted   Precordial pain 02/17/2023   Heart failure with mildly reduced ejection fraction (HFmrEF) (HCC) 02/17/2023   Hypotension 02/17/2023   Ascending aorta dilation (HCC) 02/17/2023   CAD (coronary artery disease) 02/16/2023   Chronic viral hepatitis B without delta-agent (HCC) 01/06/2023   Cancer screening 01/06/2023   Vitamin D deficiency 01/01/2023   Acute cough 11/25/2022   Influenza A 11/25/2022   Other headache syndrome 11/25/2022   Multiple fractures of ribs, bilateral, initial encounter for closed fracture 09/16/2022   Cardiac arrest (HCC) 09/08/2022   Acute cerebrovascular insufficiency 08/28/2022   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 08/28/2022   Cerebrovascular accident (CVA) due to embolism of left cerebellar artery (HCC)    S/P MVR (mitral valve repair) 08/21/2022   Chronic pain of right ankle 06/26/2022   Autonomic neuropathy in diseases classified elsewhere 04/21/2022   PAF (paroxysmal atrial fibrillation) (HCC) 12/02/2021   Laryngopharyngeal reflux (LPR) 11/11/2021   Chest wall pain    Elevated troponin    NSTEMI (non-ST elevated myocardial infarction) (HCC) 09/03/2021   Essential hypertension 09/03/2021   Renal cell carcinoma (HCC) 06/27/2021   Generalized (acute)  peritonitis (HCC) 06/24/2021   Other disorders of bilirubin metabolism 06/24/2021   Renal mass 02/18/2021   Pain, unspecified 02/08/2021   SVT (supraventricular tachycardia) 01/18/2021   Lower abdominal pain 06/26/2020   Contact with and (suspected) exposure to tuberculosis 04/02/2020   Fluid overload, unspecified 04/02/2020   Pulsatile tinnitus of right ear 11/21/2019   Kidney failure 11/21/2019   Acute diastolic CHF (congestive heart failure) (HCC) 11/21/2019   Fever and chills 11/07/2019   Hypercalcemia 10/17/2019   Dependence on renal dialysis (HCC)  06/06/2019   Coagulation defect, unspecified (HCC) 05/23/2019   Iron deficiency anemia, unspecified 05/16/2019   Anemia in other chronic diseases classified elsewhere 05/13/2019   Arteriovenous fistula, acquired (HCC) 05/13/2019   Body mass index (BMI) 28.0-28.9, adult 05/13/2019   Crohn's disease of small intestine without complications (HCC) 05/13/2019   Encounter for general adult medical examination with abnormal findings 05/13/2019   Gout, unspecified 05/13/2019   Nausea 05/13/2019   Other fatigue 05/13/2019   Secondary hyperparathyroidism of renal origin (HCC) 05/13/2019   ESRD (end stage renal disease) on dialysis (HCC) 05/13/2019   Chronic diastolic (congestive) heart failure (HCC) 05/13/2019   Chronic kidney disease, unspecified 05/13/2019   Obstructive sleep apnea 05/13/2019   Discomfort of right ear 02/18/2019   Hypokalemia 06/13/2018   Abnormal CT scan, small bowel    Perennial allergic rhinitis with a predominant nonallergic component 02/09/2018   Allergic conjunctivitis 02/09/2018   Mild intermittent asthma 02/09/2018   Atherosclerosis 12/09/2017   Hypothyroidism 11/19/2017   Mixed hyperlipidemia 10/13/2017   Personal history of gout 10/13/2017   S/P cholecystectomy 10/13/2017   Congestive heart failure with LV diastolic dysfunction, NYHA class 1 (HCC) 10/13/2017   Hypothyroidism (acquired) 10/13/2017   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/31/2017   SOB (shortness of breath) 06/07/2017   Arthritis, gouty 01/27/2017   Chronic diastolic CHF (congestive heart failure) (HCC) 01/06/2017   OSA (obstructive sleep apnea) 01/06/2017   Abnormal electrocardiogram (ECG) (EKG) 10/31/2016   Pleuritic chest pain 10/09/2016   HIV disease (HCC) 10/09/2016   Hypertensive heart disease with CHF (congestive heart failure) (HCC) 10/09/2016   Lumbar back pain 05/29/2015   NYHA class 2 heart failure with preserved ejection fraction (HCC) 05/15/2015   PAH (pulmonary artery hypertension) (HCC)  05/08/2015   Severe mitral regurgitation 05/04/2015   Prolonged Q-T interval on ECG 05/04/2015    Past Surgical History:  Procedure Laterality Date   AV FISTULA PLACEMENT Left 07/02/2018   Procedure: Creation of Left arm BRACHIOBASILIC ARTERIOVENOUS  FISTULA;  Surgeon: Cephus Shelling, MD;  Location: [facility] OR;  Service: Vascular;  Laterality: Left;   BASCILIC VEIN TRANSPOSITION Left 08/27/2018   Procedure: Left arm BRACHIOBASILIC VEIN TRANSPOSITION SECOND STAGE;  Surgeon: Cephus Shelling, MD;  Location: [facility] OR;  Service: Vascular;  Laterality: Left;   BUBBLE STUDY  09/03/2020   Procedure: BUBBLE STUDY;  Surgeon: Pricilla Riffle, MD;  Location: [facility] ENDOSCOPY;  Service: Cardiovascular;;   BUBBLE STUDY  06/03/2022   Procedure: BUBBLE STUDY;  Surgeon: Parke Poisson, MD;  Location: [facility] ENDOSCOPY;  Service: Cardiology;;   CAPD INSERTION N/A 05/30/2021   Procedure: LAPAROSCOPIC INSERTION CONTINUOUS AMBULATORY PERITONEAL DIALYSIS  (CAPD) CATHETER;  Surgeon: Leonie Douglas, MD;  Location: [facility] OR;  Service: Vascular;  Laterality: N/A;   CAPD REMOVAL N/A 02/13/2022   Procedure: PERITONEAL DIALYSIS CATHETER REMOVAL;  Surgeon: Leonie Douglas, MD;  Location: [facility] OR;  Service: Vascular;  Laterality: N/A;   CHOLECYSTECTOMY  COLONOSCOPY W/ BIOPSIES AND POLYPECTOMY     DRUG INDUCED ENDOSCOPY Bilateral 07/06/2023   Procedure: DRUG INDUCED SLEEP ENDOSCOPY;  Surgeon: Christia Reading, MD;  Location: [facility] OR;  Service: ENT;  Laterality: Bilateral;   GIVENS CAPSULE STUDY N/A 03/01/2018   Procedure: GIVENS CAPSULE STUDY;  Surgeon: Beverley Fiedler, MD;  Location: [facility] ENDOSCOPY;  Service: Gastroenterology;  Laterality: N/A;   HERNIA REPAIR     As baby   LEFT HEART CATH AND CORONARY ANGIOGRAPHY N/A 09/11/2022   Procedure: LEFT HEART CATH AND CORONARY ANGIOGRAPHY;  Surgeon: Corky Crafts, MD;  Location: [facility] INVASIVE CV LAB;  Service: Cardiovascular;  Laterality: N/A;   MITRAL VALVE REPAIR N/A 08/21/2022    Procedure: MINIMALLY INVASIVE MITRAL VALVE REPAIR USING RING ANNULOPLASTY SIMULUS 30mm;  Surgeon: Eugenio Hoes, MD;  Location: [facility] OR;  Service: Open Heart Surgery;  Laterality: N/A;   MULTIPLE TOOTH EXTRACTIONS     NEPHRECTOMY Left 02/18/2021   NOSE SURGERY     RENAL BIOPSY     RIGHT/LEFT HEART CATH AND CORONARY ANGIOGRAPHY N/A 09/04/2021   Procedure: RIGHT/LEFT HEART CATH AND CORONARY ANGIOGRAPHY;  Surgeon: Kathleene Hazel, MD;  Location[facility] INVASIVE CV LAB;  Service: Cardiovascular;  Laterality: N/A;   RIGHT/LEFT HEART CATH AND CORONARY ANGIOGRAPHY N/A 08/01/2022   Procedure: RIGHT/LEFT HEART CATH AND CORONARY ANGIOGRAPHY;  Surgeon: Tonny Bollman, MD;  Location: [facility] INVASIVE CV LAB;  Service: Cardiovascular;  Laterality: N/A;   TEE WITHOUT CARDIOVERSION N/A 09/03/2020   Procedure: TRANSESOPHAGEAL ECHOCARDIOGRAM (TEE);  Surgeon: Pricilla Riffle, MD;  Location: [facility] ENDOSCOPY;  Service: Cardiovascular;  Laterality: N/A;   TEE WITHOUT CARDIOVERSION N/A 06/03/2022   Procedure: TRANSESOPHAGEAL ECHOCARDIOGRAM (TEE);  Surgeon: Parke Poisson, MD;  Location: [facility] ENDOSCOPY;  Service: Cardiology;  Laterality: N/A;   TEE WITHOUT CARDIOVERSION N/A 08/21/2022   Procedure: TRANSESOPHAGEAL ECHOCARDIOGRAM (TEE);  Surgeon: Eugenio Hoes, MD;  Location: [facility] OR;  Service: Open Heart Surgery;  Laterality: N/A;   UPPER GI ENDOSCOPY  04/05/2021       Home Medications    Prior to Admission medications   Medication Sig Start Date End Date Taking? Authorizing Provider  acetaminophen (ACETAMINOPHEN 8 HOUR) 650 MG CR tablet Take 1,300 mg by mouth every 8 (eight) hours as needed for pain. 12/26/22 12/25/23 Yes [provider]  allopurinol (ZYLOPRIM) 100 MG tablet Take 100 mg by mouth daily.   Yes [provider]  amLODipine (NORVASC) 2.5 MG tablet Take 1 tablet (2.5 mg total) by mouth daily. 03/22/23 03/21/24 Yes Weaver, Scott T, PA-C  apixaban (ELIQUIS) 5 MG TABS tablet Take 1 tablet (5 mg total)  by mouth 2 (two) times daily. 05/19/23 08/17/23 Yes Tonny Bollman, MD  azelastine (ASTELIN) 0.1 % nasal spray Place 2 sprays into both nostrils 2 (two) times daily as needed for allergies. 04/01/23  Yes Alfonse Spruce, MD  B Complex-C-Zn-Folic Acid (DIALYVITE 800-ZINC 15 PO) Take 1 tablet by mouth daily.   Yes [provider]  BIKTARVY 50-200-25 MG TABS tablet TAKE ONE TABLET BY MOUTH EVERY DAY 12/23/22  Yes Kathlynn Grate, DO  cetirizine (ZYRTEC) 10 MG tablet Take 1 tablet (10 mg total) by mouth daily. 04/20/23  Yes Alfonse Spruce, MD  diclofenac Sodium (VOLTAREN) 1 % GEL Apply 2 g topically 4 (four) times daily. Apply to chest pain when lidocaine patch not in place Patient taking differently: Apply 2 g topically 4 (four) times daily as needed (pain). 09/14/22  Yes Tyrone Nine, MD  Doxercalciferol (  HECTOROL IV) as directed. Dialysis 09/12/22 09/11/23 Yes [provider]  famotidine (PEPCID) 20 MG tablet Take 1 tablet (20 mg total) by mouth 2 (two) times daily. 11/13/22  Yes Alfonse Spruce, MD  ferric citrate (AURYXIA) 1 GM 210 MG(Fe) tablet Take 420 mg by mouth See admin instructions. Take 420 mg by mouth three times a day with meals and 210 mg with each snack   Yes [provider]  gabapentin (NEURONTIN) 300 MG capsule Take 1 capsule (300 mg total) by mouth daily as needed (pain/neuropathy). 09/14/22  Yes Tyrone Nine, MD  heparin sodium, porcine, 1000 UNIT/ML injection Inject 1,000 Units into the vein once. 12/26/22 12/25/23 Yes [provider]  ipratropium (ATROVENT) 0.06 % nasal spray USE TWO SPRAYS IN EACH NOSTRIL two TO THREE times daily as needed 05/20/23  Yes Alfonse Spruce, MD  lactulose (CHRONULAC) 10 GM/15ML solution Take 45 mLs (30 g total) by mouth daily as needed. Take 15-30 ml up to twice daily for constipation Patient taking differently: Take 40 g by mouth daily as needed. 01/08/23  Yes Pyrtle, Carie Caddy, MD  levalbuterol  [facility] HFA) 45 MCG/ACT inhaler Inhale TWO puffs by MOUTH every FOUR (four) hours as needed FOR wheezing.] 02/16/23  Yes Alfonse Spruce, MD  levothyroxine (SYNTHROID) 25 MCG tablet take one tablet by MOUTH daily BEFORE breakfast 03/10/23  Yes [name], NP  lubiprostone (AMITIZA) 24 MCG capsule Take 1 capsule (24 mcg total) by mouth 2 (two) times daily with a meal. NEEDS OFFICE VISIT FOR FURTHER REFILLS 01/08/23  Yes Pyrtle, Carie Caddy, MD  Methoxy PEG-Epoetin Beta (MIRCERA IJ) Inject 1 each into the skin every 30 (thirty) days. Administered at dialysis center once a month 11/08/20  Yes [provider]  metoprolol tartrate (LOPRESSOR) 25 MG tablet Take 1 tablet (25 mg total) by mouth 2 (two) times daily. 05/13/23  Yes Tonny Bollman, MD  Olopatadine HCl (PATADAY) 0.7 % SOLN Place 1 drop into both eyes 2 (two) times daily as needed (allergies). 11/13/22  Yes Alfonse Spruce, MD  pantoprazole (PROTONIX) 40 MG tablet Take 1 tablet (40 mg total) by mouth daily. 06/01/23  Yes Alfonse Spruce, MD  rOPINIRole (REQUIP) 1 MG tablet TAKE ONE TABLET BY MOUTH AT BEDTIME 10/07/22  Yes Olalere, Adewale A, MD  rosuvastatin (CRESTOR) 10 MG tablet Take 10 mg by mouth daily.   Yes [provider]  SENSIPAR 60 MG tablet Take 60 mg by mouth daily. 06/25/22  Yes [provider]  sevelamer carbonate (RENVELA) 800 MG tablet Take 2,400 mg by mouth 3 (three) times daily. 04/22/23  Yes [provider]  Spacer/Aero-Holding Chambers DEVI 1 Device by Does not apply route 2 (two) times a day. 03/01/19  Yes Bobbitt, Heywood Iles, MD  SYMBICORT 160-4.5 MCG/ACT inhaler Inhale 2 puffs into the lungs in the morning and at bedtime. 07/01/23  Yes Alfonse Spruce, MD  tiZANidine (ZANAFLEX) 2 MG tablet Take 1 tablet (2 mg total) by mouth 2 (two) times daily as needed for muscle spasms. 07/07/23  Yes [name], NP  Vitamin D, Ergocalciferol, (DRISDOL) 1.25 MG (50000 UNIT) CAPS capsule Take  1 capsule (50,000 Units total) by mouth every Tuesday. 01/06/23  Yes [name], NP  fluticasone (FLOVENT HFA) 110 MCG/ACT inhaler Inhale 2 puffs into the lungs 2 (two) times daily as needed ("for flares").    [provider]    Family History Family History  Problem Relation Age of  Onset   Hypertension Mother    Heart failure Mother    Hypertension Father    Alcohol abuse Father    Hypertension Sister    Multiple sclerosis Sister    Hypertension Sister    Hypertension Brother    Heart attack Maternal Grandmother 81   Scoliosis Other    Allergic rhinitis Neg Hx    Angioedema Neg Hx    Asthma Neg Hx    Eczema Neg Hx    Immunodeficiency Neg Hx    Urticaria Neg Hx    Colon cancer Neg Hx    Pancreatic cancer Neg Hx    Esophageal cancer Neg Hx     Social History Social History   Tobacco Use   Smoking status: Former    Current packs/day: 0.00    Average packs/day: 1 pack/day for 18.0 years (18.0 ttl pk-yrs)    Types: Cigarettes    Start date: 46    Quit date: 2000    Years since quitting: 24.7   Smokeless tobacco: Never  Vaping Use   Vaping status: Never Used  Substance Use Topics   Alcohol use: Not Currently   Drug use: No     Allergies   Ace inhibitors   Review of Systems Review of Systems  Constitutional:  Positive for appetite change and chills. Negative for fever.  HENT:  Positive for sore throat.   Respiratory:  Positive for cough.   Neurological:  Positive for headaches.     Physical Exam Triage Vital Signs ED Triage Vitals  Encounter Vitals Group     BP 07/16/23 1026 118/79     Systolic BP Percentile --      Diastolic BP Percentile --      Pulse Rate 07/16/23 1026 86     Resp 07/16/23 1026 18     Temp 07/16/23 1026 99.6 F (37.6 C)     Temp Source 07/16/23 1026 Oral     SpO2 07/16/23 1026 98 %     Weight --      Height --      Head Circumference --      Peak Flow --      Pain Score 07/16/23 1025 4     Pain Loc --       Pain Education --      Exclude from Growth Chart --    No data found.  Updated Vital Signs BP 118/79 (BP Location: Right Arm)   Pulse 86   Temp 99.6 F (37.6 C) (Oral)   Resp 18   SpO2 98%   Visual Acuity Right Eye Distance:   Left Eye Distance:   Bilateral Distance:    Right Eye Near:   Left Eye Near:    Bilateral Near:     Physical Exam Constitutional:      General: He is not in acute distress.    Appearance: He is well-developed. He is not ill-appearing.  HENT:     Right Ear: Tympanic membrane normal.     Left Ear: Tympanic membrane normal.     Mouth/Throat:     Mouth: Mucous membranes are moist.     Pharynx: Oropharynx is clear. Posterior oropharyngeal erythema present. No oropharyngeal exudate.     Tonsils: No tonsillar exudate.  Eyes:     Pupils: Pupils are equal, round, and reactive to light.  Neck:     Thyroid: No thyromegaly.  Cardiovascular:     Rate and Rhythm: Normal rate and regular rhythm.  Heart sounds: Murmur (2/6 systolic) heard.  Pulmonary:     Effort: Pulmonary effort is normal.     Breath sounds: Normal breath sounds.  Abdominal:     General: Bowel sounds are normal.     Palpations: Abdomen is soft.  Musculoskeletal:     Cervical back: Normal range of motion and neck supple.  Lymphadenopathy:     Cervical: No cervical adenopathy.  Skin:    General: Skin is warm and dry.  Neurological:     Mental Status: He is alert.  Psychiatric:        Mood and Affect: Mood normal.        Behavior: Behavior normal.    UC Treatments / Results  Labs (all labs ordered are listed, but only abnormal results are displayed) Labs Reviewed - No data to display  EKG   Radiology No results found.  Procedures Procedures (including critical care time)  Medications Ordered in UC Medications - No data to display  Initial Impression / Assessment and Plan / UC Course  I have reviewed the triage vital signs and the nursing notes.  Pertinent labs &  imaging results that were available during my care of the patient were reviewed by me and considered in my medical decision making (see chart for details).    57 year old male with significant past medical history of ESRD on HD, HIV on Biktarvy, atrial flutter on Eliquis.  Vital signs and clinical appearance are reassuring.  Symptoms most correlate with viral URI.  He had labs recently 1 week ago.  Given symptoms and clinical presentation, COVID tested patient.  Advised OTC Tylenol and throat lozenges for comfort.  Discussed return precautions.  Final Clinical Impressions(s) / UC Diagnoses   Final diagnoses:  None   Discharge Instructions   None    ED Prescriptions   None    PDMP not reviewed this encounter.   Shelby Mattocks, DO 07/16/23 1148

## 2023-07-16 NOTE — ED Triage Notes (Signed)
Pt states he was having chills, headache, sore throat since yesterday. He has taken some Tylenol as needed.

## 2023-07-16 NOTE — Discharge Instructions (Addendum)
I suspect you have an upper respiratory virus.  We have testing for COVID and will get back to you regarding the results.  You may take Tylenol or Tylenol for any discomfort you are feeling and sore throat lozenges which can also find over-the-counter.  Should you develop a fever that is not responsive to Tylenol or significant worsening of your symptoms, please return for further care.

## 2023-07-17 DIAGNOSIS — N2581 Secondary hyperparathyroidism of renal origin: Secondary | ICD-10-CM | POA: Diagnosis not present

## 2023-07-17 DIAGNOSIS — Z992 Dependence on renal dialysis: Secondary | ICD-10-CM | POA: Diagnosis not present

## 2023-07-17 DIAGNOSIS — N186 End stage renal disease: Secondary | ICD-10-CM | POA: Diagnosis not present

## 2023-07-19 ENCOUNTER — Encounter: Payer: Self-pay | Admitting: Nurse Practitioner

## 2023-07-20 ENCOUNTER — Other Ambulatory Visit: Payer: Self-pay

## 2023-07-20 MED ORDER — LEVALBUTEROL TARTRATE 45 MCG/ACT IN AERO: 2.0000 | INHALATION_SPRAY | RESPIRATORY_TRACT | 1 refills | Status: DC | PRN

## 2023-07-21 ENCOUNTER — Other Ambulatory Visit: Payer: Self-pay | Admitting: Pulmonary Disease

## 2023-07-21 DIAGNOSIS — G4733 Obstructive sleep apnea (adult) (pediatric): Secondary | ICD-10-CM

## 2023-07-22 DIAGNOSIS — N186 End stage renal disease: Secondary | ICD-10-CM | POA: Diagnosis not present

## 2023-07-22 DIAGNOSIS — Z992 Dependence on renal dialysis: Secondary | ICD-10-CM | POA: Diagnosis not present

## 2023-07-22 DIAGNOSIS — N2581 Secondary hyperparathyroidism of renal origin: Secondary | ICD-10-CM | POA: Diagnosis not present

## 2023-07-23 ENCOUNTER — Other Ambulatory Visit: Payer: Self-pay | Admitting: Internal Medicine

## 2023-07-23 DIAGNOSIS — B181 Chronic viral hepatitis B without delta-agent: Secondary | ICD-10-CM

## 2023-07-24 DIAGNOSIS — N2581 Secondary hyperparathyroidism of renal origin: Secondary | ICD-10-CM | POA: Diagnosis not present

## 2023-07-24 DIAGNOSIS — N186 End stage renal disease: Secondary | ICD-10-CM | POA: Diagnosis not present

## 2023-07-24 DIAGNOSIS — Z992 Dependence on renal dialysis: Secondary | ICD-10-CM | POA: Diagnosis not present

## 2023-07-27 ENCOUNTER — Other Ambulatory Visit: Payer: Self-pay | Admitting: Cardiovascular Disease

## 2023-07-27 DIAGNOSIS — I480 Paroxysmal atrial fibrillation: Secondary | ICD-10-CM

## 2023-07-27 DIAGNOSIS — N2581 Secondary hyperparathyroidism of renal origin: Secondary | ICD-10-CM | POA: Diagnosis not present

## 2023-07-27 DIAGNOSIS — Z992 Dependence on renal dialysis: Secondary | ICD-10-CM | POA: Diagnosis not present

## 2023-07-27 DIAGNOSIS — I129 Hypertensive chronic kidney disease with stage 1 through stage 4 chronic kidney disease, or unspecified chronic kidney disease: Secondary | ICD-10-CM | POA: Diagnosis not present

## 2023-07-27 DIAGNOSIS — N186 End stage renal disease: Secondary | ICD-10-CM | POA: Diagnosis not present

## 2023-07-27 NOTE — Telephone Encounter (Signed)
Prescription refill request for Eliquis received. Indication: PAF Last office visit: 06/11/23  Jeanie Cooks MD Scr: 9.27 on 07/09/23  Epic Age: 57 Weight: 99.1kg  Based on above findings Eliquis 5mg  twice daily is the appropriate dose.  Refill approved.

## 2023-07-28 ENCOUNTER — Ambulatory Visit: Payer: Medicare PPO | Admitting: Internal Medicine

## 2023-07-28 ENCOUNTER — Encounter: Payer: Self-pay | Admitting: Internal Medicine

## 2023-07-28 ENCOUNTER — Other Ambulatory Visit: Payer: Self-pay

## 2023-07-28 VITALS — BP 144/89 | HR 112 | Temp 98.2°F | Wt 209.0 lb

## 2023-07-28 DIAGNOSIS — B181 Chronic viral hepatitis B without delta-agent: Secondary | ICD-10-CM | POA: Diagnosis not present

## 2023-07-28 DIAGNOSIS — B20 Human immunodeficiency virus [HIV] disease: Secondary | ICD-10-CM

## 2023-07-28 MED ORDER — BIKTARVY 50-200-25 MG PO TABS
1.0000 | ORAL_TABLET | Freq: Every day | ORAL | 11 refills | Status: DC
Start: 2023-07-28 — End: 2024-08-22

## 2023-07-28 NOTE — Progress Notes (Signed)
Subjective:    Patient ID: Albert Eaton, male    DOB: 05-24-1966, 57 y.o.   MRN: [id number]  HPI Albert Eaton is here for follow up of HIV He continues on [place] with no missed doses.  No issues with getting or taking his medication.  He is followed at [facility] for transplant evaluation.  No complaints today.  Already had his COVID and flu shots.     Review of Systems  Constitutional:  Negative for fatigue.  Gastrointestinal:  Negative for diarrhea and nausea.       Objective:   Physical Exam Eyes:     General: No scleral icterus. Pulmonary:     Effort: Pulmonary effort is normal.  Neurological:     Mental Status: He is alert.   SH: no tobacco        Assessment & Plan:

## 2023-07-28 NOTE — Assessment & Plan Note (Addendum)
He continues to do well, no concerns.  On Biktarvy and no changes indicated.  Follow up in 6 months.   I have personally spent 30 minutes involved in face-to-face and non-face-to-face activities for this patient on the day of the visit. Professional time spent includes the following activities: Preparing to see the patient (review of tests), Obtaining and/or reviewing separately obtained history (admission/discharge record), Performing a medically appropriate examination and/or evaluation , Ordering medications/tests/procedures, referring and communicating with other health care professionals, Documenting clinical information in the EMR, Independently interpreting results (not separately reported), Communicating results to the patient/family/caregiver, Counseling and educating the patient/family/caregiver and Care coordination (not separately reported).

## 2023-07-28 NOTE — Assessment & Plan Note (Signed)
Recent hepatitis B DNA negative and on biktarvy.  No changes

## 2023-07-29 DIAGNOSIS — N186 End stage renal disease: Secondary | ICD-10-CM | POA: Diagnosis not present

## 2023-07-29 DIAGNOSIS — Z992 Dependence on renal dialysis: Secondary | ICD-10-CM | POA: Diagnosis not present

## 2023-07-29 DIAGNOSIS — N2581 Secondary hyperparathyroidism of renal origin: Secondary | ICD-10-CM | POA: Diagnosis not present

## 2023-07-31 DIAGNOSIS — Z992 Dependence on renal dialysis: Secondary | ICD-10-CM | POA: Diagnosis not present

## 2023-07-31 DIAGNOSIS — N186 End stage renal disease: Secondary | ICD-10-CM | POA: Diagnosis not present

## 2023-07-31 DIAGNOSIS — N2581 Secondary hyperparathyroidism of renal origin: Secondary | ICD-10-CM | POA: Diagnosis not present

## 2023-08-03 DIAGNOSIS — N2581 Secondary hyperparathyroidism of renal origin: Secondary | ICD-10-CM | POA: Diagnosis not present

## 2023-08-03 DIAGNOSIS — Z992 Dependence on renal dialysis: Secondary | ICD-10-CM | POA: Diagnosis not present

## 2023-08-03 DIAGNOSIS — N186 End stage renal disease: Secondary | ICD-10-CM | POA: Diagnosis not present

## 2023-08-05 DIAGNOSIS — Z992 Dependence on renal dialysis: Secondary | ICD-10-CM | POA: Diagnosis not present

## 2023-08-05 DIAGNOSIS — N186 End stage renal disease: Secondary | ICD-10-CM | POA: Diagnosis not present

## 2023-08-05 DIAGNOSIS — Z01818 Encounter for other preprocedural examination: Secondary | ICD-10-CM | POA: Diagnosis not present

## 2023-08-05 DIAGNOSIS — N2581 Secondary hyperparathyroidism of renal origin: Secondary | ICD-10-CM | POA: Diagnosis not present

## 2023-08-07 ENCOUNTER — Other Ambulatory Visit: Payer: Self-pay | Admitting: Physician Assistant

## 2023-08-07 ENCOUNTER — Ambulatory Visit: Payer: Medicare PPO | Attending: Nurse Practitioner

## 2023-08-07 DIAGNOSIS — N2581 Secondary hyperparathyroidism of renal origin: Secondary | ICD-10-CM | POA: Diagnosis not present

## 2023-08-07 DIAGNOSIS — Z992 Dependence on renal dialysis: Secondary | ICD-10-CM | POA: Diagnosis not present

## 2023-08-07 DIAGNOSIS — N186 End stage renal disease: Secondary | ICD-10-CM | POA: Diagnosis not present

## 2023-08-07 DIAGNOSIS — Z79899 Other long term (current) drug therapy: Secondary | ICD-10-CM | POA: Diagnosis not present

## 2023-08-08 LAB — LIPID PANEL
Chol/HDL Ratio: 5.2 {ratio} — ABNORMAL HIGH (ref 0.0–5.0)
Cholesterol, Total: 188 mg/dL (ref 100–199)
HDL: 36 mg/dL — ABNORMAL LOW (ref >39)
LDL Chol Calc (NIH): 122 mg/dL — ABNORMAL HIGH (ref 0–99)
Triglycerides: 169 mg/dL — ABNORMAL HIGH (ref 0–149)
VLDL Cholesterol Cal: 30 mg/dL (ref 5–40)

## 2023-08-08 LAB — ALT: ALT: 16 [IU]/L (ref 0–44)

## 2023-08-10 DIAGNOSIS — N2581 Secondary hyperparathyroidism of renal origin: Secondary | ICD-10-CM | POA: Diagnosis not present

## 2023-08-10 DIAGNOSIS — N186 End stage renal disease: Secondary | ICD-10-CM | POA: Diagnosis not present

## 2023-08-10 DIAGNOSIS — Z992 Dependence on renal dialysis: Secondary | ICD-10-CM | POA: Diagnosis not present

## 2023-08-12 DIAGNOSIS — N186 End stage renal disease: Secondary | ICD-10-CM | POA: Diagnosis not present

## 2023-08-12 DIAGNOSIS — N2581 Secondary hyperparathyroidism of renal origin: Secondary | ICD-10-CM | POA: Diagnosis not present

## 2023-08-12 DIAGNOSIS — Z992 Dependence on renal dialysis: Secondary | ICD-10-CM | POA: Diagnosis not present

## 2023-08-12 DIAGNOSIS — Z7682 Awaiting organ transplant status: Secondary | ICD-10-CM | POA: Diagnosis not present

## 2023-08-13 ENCOUNTER — Telehealth: Payer: Self-pay | Admitting: *Deleted

## 2023-08-13 DIAGNOSIS — E782 Mixed hyperlipidemia: Secondary | ICD-10-CM

## 2023-08-13 NOTE — Telephone Encounter (Signed)
Pt verified he is taking Rosuvastatin 10 mg daily.

## 2023-08-13 NOTE — Telephone Encounter (Signed)
Left pt a message to call back.  Need to verify Rosuvastatin dosage.

## 2023-08-13 NOTE — Telephone Encounter (Signed)
-----   Message from [place] sent at 08/13/2023  8:49 AM EDT ----- Decrease Rosuvastatin to 10 mg once daily Start Ezetimibe 10 mg once daily  Arrange fasting Lipids, ALT in 3 mos [name], PA-C    08/13/2023 8:48 AM

## 2023-08-14 DIAGNOSIS — N2581 Secondary hyperparathyroidism of renal origin: Secondary | ICD-10-CM | POA: Diagnosis not present

## 2023-08-14 DIAGNOSIS — Z992 Dependence on renal dialysis: Secondary | ICD-10-CM | POA: Diagnosis not present

## 2023-08-14 DIAGNOSIS — N186 End stage renal disease: Secondary | ICD-10-CM | POA: Diagnosis not present

## 2023-08-17 DIAGNOSIS — Z992 Dependence on renal dialysis: Secondary | ICD-10-CM | POA: Diagnosis not present

## 2023-08-17 DIAGNOSIS — N186 End stage renal disease: Secondary | ICD-10-CM | POA: Diagnosis not present

## 2023-08-17 DIAGNOSIS — N2581 Secondary hyperparathyroidism of renal origin: Secondary | ICD-10-CM | POA: Diagnosis not present

## 2023-08-17 MED ORDER — EZETIMIBE 10 MG PO TABS: 10.0000 mg | ORAL_TABLET | Freq: Every day | ORAL | 3 refills | Status: DC

## 2023-08-17 NOTE — Telephone Encounter (Signed)
-----   Message from [place] sent at 08/15/2023  7:56 PM EDT ----- Stay on Rosuvastatin 10 mg once daily and add Ezetimibe 10 mg once daily. Repeat fasting Lipids, ALT in 3 mos. [name], PA-C    08/15/2023 7:56 PM

## 2023-08-17 NOTE — Telephone Encounter (Signed)
Pt returned my call and he has been made aware of his lab results / recommendations.

## 2023-08-17 NOTE — Telephone Encounter (Signed)
Left pt a message to call back regarding adding Zetia to his Rosuvastatin 10 mg daily and repeat labs in 3 months.

## 2023-08-18 DIAGNOSIS — D631 Anemia in chronic kidney disease: Secondary | ICD-10-CM | POA: Diagnosis not present

## 2023-08-18 DIAGNOSIS — K219 Gastro-esophageal reflux disease without esophagitis: Secondary | ICD-10-CM | POA: Diagnosis not present

## 2023-08-18 DIAGNOSIS — N261 Atrophy of kidney (terminal): Secondary | ICD-10-CM | POA: Diagnosis not present

## 2023-08-18 DIAGNOSIS — J45909 Unspecified asthma, uncomplicated: Secondary | ICD-10-CM | POA: Diagnosis not present

## 2023-08-18 DIAGNOSIS — I132 Hypertensive heart and chronic kidney disease with heart failure and with stage 5 chronic kidney disease, or end stage renal disease: Secondary | ICD-10-CM | POA: Diagnosis not present

## 2023-08-18 DIAGNOSIS — N186 End stage renal disease: Secondary | ICD-10-CM | POA: Diagnosis not present

## 2023-08-18 DIAGNOSIS — C642 Malignant neoplasm of left kidney, except renal pelvis: Secondary | ICD-10-CM | POA: Diagnosis not present

## 2023-08-18 DIAGNOSIS — I509 Heart failure, unspecified: Secondary | ICD-10-CM | POA: Diagnosis not present

## 2023-08-18 DIAGNOSIS — Z905 Acquired absence of kidney: Secondary | ICD-10-CM | POA: Diagnosis not present

## 2023-08-18 DIAGNOSIS — Z21 Asymptomatic human immunodeficiency virus [HIV] infection status: Secondary | ICD-10-CM | POA: Diagnosis not present

## 2023-08-19 DIAGNOSIS — N186 End stage renal disease: Secondary | ICD-10-CM | POA: Diagnosis not present

## 2023-08-19 DIAGNOSIS — N2581 Secondary hyperparathyroidism of renal origin: Secondary | ICD-10-CM | POA: Diagnosis not present

## 2023-08-19 DIAGNOSIS — Z992 Dependence on renal dialysis: Secondary | ICD-10-CM | POA: Diagnosis not present

## 2023-08-21 DIAGNOSIS — N2581 Secondary hyperparathyroidism of renal origin: Secondary | ICD-10-CM | POA: Diagnosis not present

## 2023-08-21 DIAGNOSIS — N186 End stage renal disease: Secondary | ICD-10-CM | POA: Diagnosis not present

## 2023-08-21 DIAGNOSIS — Z992 Dependence on renal dialysis: Secondary | ICD-10-CM | POA: Diagnosis not present

## 2023-08-24 DIAGNOSIS — N2581 Secondary hyperparathyroidism of renal origin: Secondary | ICD-10-CM | POA: Diagnosis not present

## 2023-08-24 DIAGNOSIS — N186 End stage renal disease: Secondary | ICD-10-CM | POA: Diagnosis not present

## 2023-08-24 DIAGNOSIS — Z992 Dependence on renal dialysis: Secondary | ICD-10-CM | POA: Diagnosis not present

## 2023-08-26 DIAGNOSIS — N186 End stage renal disease: Secondary | ICD-10-CM | POA: Diagnosis not present

## 2023-08-26 DIAGNOSIS — Z992 Dependence on renal dialysis: Secondary | ICD-10-CM | POA: Diagnosis not present

## 2023-08-26 DIAGNOSIS — N2581 Secondary hyperparathyroidism of renal origin: Secondary | ICD-10-CM | POA: Diagnosis not present

## 2023-08-27 ENCOUNTER — Other Ambulatory Visit: Payer: Self-pay | Admitting: Nurse Practitioner

## 2023-08-27 DIAGNOSIS — N186 End stage renal disease: Secondary | ICD-10-CM | POA: Diagnosis not present

## 2023-08-27 DIAGNOSIS — Z992 Dependence on renal dialysis: Secondary | ICD-10-CM | POA: Diagnosis not present

## 2023-08-27 DIAGNOSIS — I129 Hypertensive chronic kidney disease with stage 1 through stage 4 chronic kidney disease, or unspecified chronic kidney disease: Secondary | ICD-10-CM | POA: Diagnosis not present

## 2023-08-28 DIAGNOSIS — Z992 Dependence on renal dialysis: Secondary | ICD-10-CM | POA: Diagnosis not present

## 2023-08-28 DIAGNOSIS — N2581 Secondary hyperparathyroidism of renal origin: Secondary | ICD-10-CM | POA: Diagnosis not present

## 2023-08-28 DIAGNOSIS — N186 End stage renal disease: Secondary | ICD-10-CM | POA: Diagnosis not present

## 2023-09-01 DIAGNOSIS — N2581 Secondary hyperparathyroidism of renal origin: Secondary | ICD-10-CM | POA: Diagnosis not present

## 2023-09-01 DIAGNOSIS — N186 End stage renal disease: Secondary | ICD-10-CM | POA: Diagnosis not present

## 2023-09-01 DIAGNOSIS — Z992 Dependence on renal dialysis: Secondary | ICD-10-CM | POA: Diagnosis not present

## 2023-09-02 ENCOUNTER — Ambulatory Visit (HOSPITAL_BASED_OUTPATIENT_CLINIC_OR_DEPARTMENT_OTHER): Payer: Medicare PPO | Attending: Pulmonary Disease | Admitting: Pulmonary Disease

## 2023-09-02 DIAGNOSIS — I10 Essential (primary) hypertension: Secondary | ICD-10-CM | POA: Diagnosis not present

## 2023-09-02 DIAGNOSIS — G4730 Sleep apnea, unspecified: Secondary | ICD-10-CM | POA: Diagnosis present

## 2023-09-02 DIAGNOSIS — Z79899 Other long term (current) drug therapy: Secondary | ICD-10-CM | POA: Diagnosis not present

## 2023-09-02 DIAGNOSIS — Z992 Dependence on renal dialysis: Secondary | ICD-10-CM | POA: Diagnosis not present

## 2023-09-02 DIAGNOSIS — R0683 Snoring: Secondary | ICD-10-CM | POA: Insufficient documentation

## 2023-09-02 DIAGNOSIS — G4733 Obstructive sleep apnea (adult) (pediatric): Secondary | ICD-10-CM | POA: Insufficient documentation

## 2023-09-02 DIAGNOSIS — R5383 Other fatigue: Secondary | ICD-10-CM | POA: Insufficient documentation

## 2023-09-02 DIAGNOSIS — G4736 Sleep related hypoventilation in conditions classified elsewhere: Secondary | ICD-10-CM | POA: Diagnosis not present

## 2023-09-02 DIAGNOSIS — N2581 Secondary hyperparathyroidism of renal origin: Secondary | ICD-10-CM | POA: Diagnosis not present

## 2023-09-02 DIAGNOSIS — N186 End stage renal disease: Secondary | ICD-10-CM | POA: Diagnosis not present

## 2023-09-04 ENCOUNTER — Telehealth: Payer: Self-pay | Admitting: Pulmonary Disease

## 2023-09-04 DIAGNOSIS — G4733 Obstructive sleep apnea (adult) (pediatric): Secondary | ICD-10-CM

## 2023-09-04 DIAGNOSIS — N2581 Secondary hyperparathyroidism of renal origin: Secondary | ICD-10-CM | POA: Diagnosis not present

## 2023-09-04 DIAGNOSIS — Z992 Dependence on renal dialysis: Secondary | ICD-10-CM | POA: Diagnosis not present

## 2023-09-04 DIAGNOSIS — N186 End stage renal disease: Secondary | ICD-10-CM | POA: Diagnosis not present

## 2023-09-04 NOTE — Telephone Encounter (Signed)
Call patient  Sleep study result  Date of study: 09/02/2023  Impression: Moderate obstructive sleep apnea with moderate oxygen desaturations  Recommendation: DME referral  Recommend CPAP therapy for moderate obstructive sleep apnea  Auto titrating CPAP with pressure settings of 5-20 will be appropriate  Encourage weight loss measures  Follow-up in the office 4 to 6 weeks following initiation of treatment

## 2023-09-04 NOTE — Procedures (Signed)
POLYSOMNOGRAPHY  Last, First: Eaton, Albert MRN: [id number] Gender: Male Age (years): 15 Weight (lbs): 200 DOB: 1966-03-30 BMI: 29 Primary Care: Julianne Handler Epworth Score: 6 Referring: Tomma Lightning MD Technician: Ulyess Mort Interpreting: Tomma Lightning MD Study Type: NPSG Ordered Study Type: NPSG Study date: 09/02/2023 Location: [place] CLINICAL INFORMATION Albert Eaton is a 57 year old Male and was referred to the sleep center for evaluation of G47.30 Sleep Apnea, Unspecified (780.57), G47.33 OSA: Adult and Pediatric (327.23). Indications include Daytime Fatigue, Fatigue, Hypertension, Snoring.   Most recent polysomnogram dated 11/12/2021 revealed an AHI of 23.3/h. MEDICATIONS Patient self administered medications include: ALDACTONE, APRESOLINE, COREG, ISENTRESS, LIPITOR, PEPCID, XYZAL. Medications administered during study include No sleep medicine administered.  SLEEP STUDY TECHNIQUE A multi-channel overnight Polysomnography study was performed. The channels recorded and monitored were central and occipital EEG, electrooculogram (EOG), submentalis EMG (chin), nasal and oral airflow, thoracic and abdominal wall motion, anterior tibialis EMG, snore microphone, electrocardiogram, and a pulse oximetry. TECHNICIAN COMMENTS Comments added by Technician: PATIENT WAS ORDERED AS A NPSG ONLY. Patient was restless all through the night. Patient had difficulty initiating sleep. Comments added by Scorer: N/A SLEEP ARCHITECTURE The study was initiated at 9:51:23 PM and terminated at 4:26:18 AM. The total recorded time was 394.9 minutes. EEG confirmed total sleep time was 190.5 minutes yielding a sleep efficiency of 48.2%. Sleep onset after lights out was 75.4 minutes with a REM latency of 21.5 minutes. The patient spent 11.0% of the night in stage N1 sleep, 59.3% in stage N2 sleep, 0.0% in stage N3 and 29.7% in REM. Wake after sleep onset (WASO) was 129.0 minutes. The Arousal  Index was 64.9/hour. RESPIRATORY PARAMETERS There were a total of 60 respiratory disturbances out of which 40 were apneas ( 31 obstructive, 0 mixed, 9 central) and 20 hypopneas. The apnea/hypopnea index (AHI) was 18.9 events/hour. The central sleep apnea index was 2.8 events/hour. The REM AHI was 30.8 events/hour and NREM AHI was 13.9 events/hour. The supine AHI was 73.8 events/hour and the non supine AHI was 17 supine during 3.41% of sleep. Respiratory disturbances were associated with oxygen desaturation down to a nadir of 84.0% during sleep. The mean oxygen saturation during the study was 96.1%.  LEG MOVEMENT DATA The total leg movements were 72 with a resulting leg movement index of 22.7/hr .Associated arousal with leg movement index was 2.5/hr.  CARDIAC DATA The underlying cardiac rhythm was most consistent with sinus rhythm. Mean heart rate during sleep was 77.9 bpm. Additional rhythm abnormalities include PVCs.  IMPRESSIONS - Moderate Obstructive Sleep apnea(OSA) - Electrocardiographic data showed presence of PVCs. - Moderate Oxygen Desaturation - The patient snored with loud snoring volume. - No significant periodic leg movements(PLMs) during sleep. However, no significant associated arousals.  DIAGNOSIS - Obstructive Sleep Apnea (G47.33) - Nocturnal Hypoxemia (G47.36)  RECOMMENDATIONS - Therapeutic CPAP titration to determine optimal pressure required to alleviate sleep disordered breathing. - Auto titrating CPAP with pressure settings of 5-20 with heated humidification with patient's mask of choice may be considered - Positional therapy avoiding supine position during sleep. - Avoid alcohol, sedatives and other CNS depressants that may worsen sleep apnea and disrupt normal sleep architecture. - Sleep hygiene should be reviewed to assess factors that may improve sleep quality. - Weight management and regular exercise should be initiated or continued.  [Electronically signed]  09/04/2023 05:48 AM  Virl Diamond MD NPI: [id number]

## 2023-09-07 DIAGNOSIS — N2581 Secondary hyperparathyroidism of renal origin: Secondary | ICD-10-CM | POA: Diagnosis not present

## 2023-09-07 DIAGNOSIS — Z7682 Awaiting organ transplant status: Secondary | ICD-10-CM | POA: Diagnosis not present

## 2023-09-07 DIAGNOSIS — Z992 Dependence on renal dialysis: Secondary | ICD-10-CM | POA: Diagnosis not present

## 2023-09-07 DIAGNOSIS — N186 End stage renal disease: Secondary | ICD-10-CM | POA: Diagnosis not present

## 2023-09-09 DIAGNOSIS — N186 End stage renal disease: Secondary | ICD-10-CM | POA: Diagnosis not present

## 2023-09-09 DIAGNOSIS — Z992 Dependence on renal dialysis: Secondary | ICD-10-CM | POA: Diagnosis not present

## 2023-09-09 DIAGNOSIS — N2581 Secondary hyperparathyroidism of renal origin: Secondary | ICD-10-CM | POA: Diagnosis not present

## 2023-09-11 DIAGNOSIS — Z992 Dependence on renal dialysis: Secondary | ICD-10-CM | POA: Diagnosis not present

## 2023-09-11 DIAGNOSIS — N186 End stage renal disease: Secondary | ICD-10-CM | POA: Diagnosis not present

## 2023-09-11 DIAGNOSIS — N2581 Secondary hyperparathyroidism of renal origin: Secondary | ICD-10-CM | POA: Diagnosis not present

## 2023-09-14 DIAGNOSIS — N186 End stage renal disease: Secondary | ICD-10-CM | POA: Diagnosis not present

## 2023-09-14 DIAGNOSIS — Z992 Dependence on renal dialysis: Secondary | ICD-10-CM | POA: Diagnosis not present

## 2023-09-14 DIAGNOSIS — N2581 Secondary hyperparathyroidism of renal origin: Secondary | ICD-10-CM | POA: Diagnosis not present

## 2023-09-15 ENCOUNTER — Telehealth: Payer: Self-pay | Admitting: Cardiovascular Disease

## 2023-09-15 NOTE — Telephone Encounter (Signed)
   Name: Albert Eaton  DOB: 09-27-1966  MRN: [id number]  Primary Cardiologist: Tonny Bollman, MD   Preoperative team, please contact this patient and set up a phone call appointment for further preoperative risk assessment. Please obtain consent and complete medication review. Thank you for your help.  I confirm that guidance regarding antiplatelet and oral anticoagulation therapy has been completed and, if necessary, noted below.  Per office protocol, patient can hold Eliquis for 3 days prior to procedure.    I also confirmed the patient resides in the state of [place]. As per [facility] Medical Board telemedicine laws, the patient must reside in the state in which the provider is licensed.   Napoleon Form, Leodis Rains, NP 09/15/2023, 3:13 PM [facility]

## 2023-09-15 NOTE — Telephone Encounter (Signed)
Patient with diagnosis of aflutter on Eliquis for anticoagulation.    Procedure: Inspire Date of procedure: TBD   CHA2DS2-VASc Score = 5   This indicates a 7.2% annual risk of stroke. The patient's score is based upon: CHF History: 1 HTN History: 1 Diabetes History: 0 Stroke History: 2 Vascular Disease History: 1 Age Score: 0 Gender Score: 0      Pt is ESRD on HD  Per office protocol, patient can hold Eliquis for 3 days prior to procedure.    **This guidance is not considered finalized until pre-operative APP has relayed final recommendations.**

## 2023-09-15 NOTE — Telephone Encounter (Signed)
     Pre-operative Risk Assessment    Patient Name: Albert Eaton  DOB: Sep 24, 1966 MRN: [id number]      Request for Surgical Clearance    Procedure:   Inspire   Date of Surgery:  Clearance TBD                                 Surgeon:  Dr. Christia Reading  Surgeon's Group or Practice Name:  [facility]  Phone number:  [phone] Fax number:  [phone]   Type of Clearance Requested:   - Medical  - Pharmacy:  Hold Aspirin and Heparin  defer to cards   Type of Anesthesia:  General    Additional requests/questions:    Signed, Noe Gens   09/15/2023, 9:18 AM

## 2023-09-15 NOTE — Telephone Encounter (Signed)
Pharmacy please advise on holding Eliquis prior to inspire device scheduled for TBD. Thank you.

## 2023-09-16 DIAGNOSIS — N186 End stage renal disease: Secondary | ICD-10-CM | POA: Diagnosis not present

## 2023-09-16 DIAGNOSIS — Z992 Dependence on renal dialysis: Secondary | ICD-10-CM | POA: Diagnosis not present

## 2023-09-16 DIAGNOSIS — N2581 Secondary hyperparathyroidism of renal origin: Secondary | ICD-10-CM | POA: Diagnosis not present

## 2023-09-16 NOTE — Telephone Encounter (Addendum)
Patient states that he is unsure we he is having the procedure and would prefer to call us back once he finds out more information.   Patient is already scheduled to see Dr. Excell Seltzer on 10/28/22 and is aware that if his procedure is scheduled after he sees him then we can address clearance at visit.  Will remove from pool until patient calls back

## 2023-09-18 DIAGNOSIS — N186 End stage renal disease: Secondary | ICD-10-CM | POA: Diagnosis not present

## 2023-09-18 DIAGNOSIS — Z992 Dependence on renal dialysis: Secondary | ICD-10-CM | POA: Diagnosis not present

## 2023-09-18 DIAGNOSIS — N2581 Secondary hyperparathyroidism of renal origin: Secondary | ICD-10-CM | POA: Diagnosis not present

## 2023-09-20 DIAGNOSIS — Z992 Dependence on renal dialysis: Secondary | ICD-10-CM | POA: Diagnosis not present

## 2023-09-20 DIAGNOSIS — N186 End stage renal disease: Secondary | ICD-10-CM | POA: Diagnosis not present

## 2023-09-20 DIAGNOSIS — N2581 Secondary hyperparathyroidism of renal origin: Secondary | ICD-10-CM | POA: Diagnosis not present

## 2023-09-22 DIAGNOSIS — Z992 Dependence on renal dialysis: Secondary | ICD-10-CM | POA: Diagnosis not present

## 2023-09-22 DIAGNOSIS — N2581 Secondary hyperparathyroidism of renal origin: Secondary | ICD-10-CM | POA: Diagnosis not present

## 2023-09-22 DIAGNOSIS — N186 End stage renal disease: Secondary | ICD-10-CM | POA: Diagnosis not present

## 2023-09-25 DIAGNOSIS — Z992 Dependence on renal dialysis: Secondary | ICD-10-CM | POA: Diagnosis not present

## 2023-09-25 DIAGNOSIS — N2581 Secondary hyperparathyroidism of renal origin: Secondary | ICD-10-CM | POA: Diagnosis not present

## 2023-09-25 DIAGNOSIS — N186 End stage renal disease: Secondary | ICD-10-CM | POA: Diagnosis not present

## 2023-09-26 DIAGNOSIS — I129 Hypertensive chronic kidney disease with stage 1 through stage 4 chronic kidney disease, or unspecified chronic kidney disease: Secondary | ICD-10-CM | POA: Diagnosis not present

## 2023-09-26 DIAGNOSIS — N186 End stage renal disease: Secondary | ICD-10-CM | POA: Diagnosis not present

## 2023-09-26 DIAGNOSIS — Z992 Dependence on renal dialysis: Secondary | ICD-10-CM | POA: Diagnosis not present

## 2023-09-28 DIAGNOSIS — Z992 Dependence on renal dialysis: Secondary | ICD-10-CM | POA: Diagnosis not present

## 2023-09-28 DIAGNOSIS — N2581 Secondary hyperparathyroidism of renal origin: Secondary | ICD-10-CM | POA: Diagnosis not present

## 2023-09-28 DIAGNOSIS — N186 End stage renal disease: Secondary | ICD-10-CM | POA: Diagnosis not present

## 2023-09-30 ENCOUNTER — Other Ambulatory Visit: Payer: Self-pay | Admitting: Cardiovascular Disease

## 2023-09-30 DIAGNOSIS — N2581 Secondary hyperparathyroidism of renal origin: Secondary | ICD-10-CM | POA: Diagnosis not present

## 2023-09-30 DIAGNOSIS — Z992 Dependence on renal dialysis: Secondary | ICD-10-CM | POA: Diagnosis not present

## 2023-09-30 DIAGNOSIS — N186 End stage renal disease: Secondary | ICD-10-CM | POA: Diagnosis not present

## 2023-10-01 DIAGNOSIS — Z01810 Encounter for preprocedural cardiovascular examination: Secondary | ICD-10-CM | POA: Diagnosis not present

## 2023-10-01 DIAGNOSIS — N189 Chronic kidney disease, unspecified: Secondary | ICD-10-CM | POA: Diagnosis not present

## 2023-10-01 DIAGNOSIS — N184 Chronic kidney disease, stage 4 (severe): Secondary | ICD-10-CM | POA: Diagnosis not present

## 2023-10-01 DIAGNOSIS — Z7682 Awaiting organ transplant status: Secondary | ICD-10-CM | POA: Diagnosis not present

## 2023-10-01 DIAGNOSIS — I129 Hypertensive chronic kidney disease with stage 1 through stage 4 chronic kidney disease, or unspecified chronic kidney disease: Secondary | ICD-10-CM | POA: Diagnosis not present

## 2023-10-02 DIAGNOSIS — N2581 Secondary hyperparathyroidism of renal origin: Secondary | ICD-10-CM | POA: Diagnosis not present

## 2023-10-02 DIAGNOSIS — Z992 Dependence on renal dialysis: Secondary | ICD-10-CM | POA: Diagnosis not present

## 2023-10-02 DIAGNOSIS — N186 End stage renal disease: Secondary | ICD-10-CM | POA: Diagnosis not present

## 2023-10-05 DIAGNOSIS — Z7682 Awaiting organ transplant status: Secondary | ICD-10-CM | POA: Diagnosis not present

## 2023-10-05 DIAGNOSIS — Z992 Dependence on renal dialysis: Secondary | ICD-10-CM | POA: Diagnosis not present

## 2023-10-05 DIAGNOSIS — N186 End stage renal disease: Secondary | ICD-10-CM | POA: Diagnosis not present

## 2023-10-05 DIAGNOSIS — N2581 Secondary hyperparathyroidism of renal origin: Secondary | ICD-10-CM | POA: Diagnosis not present

## 2023-10-07 DIAGNOSIS — N186 End stage renal disease: Secondary | ICD-10-CM | POA: Diagnosis not present

## 2023-10-07 DIAGNOSIS — N2581 Secondary hyperparathyroidism of renal origin: Secondary | ICD-10-CM | POA: Diagnosis not present

## 2023-10-07 DIAGNOSIS — Z992 Dependence on renal dialysis: Secondary | ICD-10-CM | POA: Diagnosis not present

## 2023-10-08 DIAGNOSIS — Z992 Dependence on renal dialysis: Secondary | ICD-10-CM | POA: Diagnosis not present

## 2023-10-08 DIAGNOSIS — N186 End stage renal disease: Secondary | ICD-10-CM | POA: Diagnosis not present

## 2023-10-08 DIAGNOSIS — N2581 Secondary hyperparathyroidism of renal origin: Secondary | ICD-10-CM | POA: Diagnosis not present

## 2023-10-12 DIAGNOSIS — Z992 Dependence on renal dialysis: Secondary | ICD-10-CM | POA: Diagnosis not present

## 2023-10-12 DIAGNOSIS — N186 End stage renal disease: Secondary | ICD-10-CM | POA: Diagnosis not present

## 2023-10-12 DIAGNOSIS — N2581 Secondary hyperparathyroidism of renal origin: Secondary | ICD-10-CM | POA: Diagnosis not present

## 2023-10-13 ENCOUNTER — Other Ambulatory Visit: Payer: Self-pay | Admitting: Otolaryngology

## 2023-10-14 DIAGNOSIS — N2581 Secondary hyperparathyroidism of renal origin: Secondary | ICD-10-CM | POA: Diagnosis not present

## 2023-10-14 DIAGNOSIS — Z992 Dependence on renal dialysis: Secondary | ICD-10-CM | POA: Diagnosis not present

## 2023-10-14 DIAGNOSIS — N186 End stage renal disease: Secondary | ICD-10-CM | POA: Diagnosis not present

## 2023-10-15 ENCOUNTER — Other Ambulatory Visit (HOSPITAL_COMMUNITY): Payer: Self-pay

## 2023-10-15 ENCOUNTER — Telehealth: Payer: Self-pay

## 2023-10-15 NOTE — Telephone Encounter (Signed)
Can we send in albuterol or is there a particular reason pt need xopenex?

## 2023-10-15 NOTE — Telephone Encounter (Signed)
*  Allergy/Asthma  Pharmacy Patient Advocate Encounter   Received notification from CoverMyMeds that prior authorization for Levalbuterol Tartrate 45MCG/ACT aerosol  is required/requested.   Insurance verification completed.   The patient is insured through [place] .   Per test claim:  Albuterol HFA 8.5gm/18gm is preferred by the insurance.  If suggested medication is appropriate, Please send in a new RX and discontinue this one. If not, please advise as to why it's not appropriate so that we may request a Prior Authorization. Please note, some preferred medications may still require a PA

## 2023-10-16 ENCOUNTER — Other Ambulatory Visit: Payer: Self-pay

## 2023-10-16 ENCOUNTER — Other Ambulatory Visit (HOSPITAL_COMMUNITY): Payer: Self-pay

## 2023-10-16 DIAGNOSIS — Z992 Dependence on renal dialysis: Secondary | ICD-10-CM | POA: Diagnosis not present

## 2023-10-16 DIAGNOSIS — N186 End stage renal disease: Secondary | ICD-10-CM | POA: Diagnosis not present

## 2023-10-16 DIAGNOSIS — N2581 Secondary hyperparathyroidism of renal origin: Secondary | ICD-10-CM | POA: Diagnosis not present

## 2023-10-16 MED ORDER — LUBIPROSTONE 24 MCG PO CAPS: 24.0000 ug | ORAL_CAPSULE | Freq: Two times a day (BID) | ORAL | 0 refills | Status: DC

## 2023-10-16 NOTE — Telephone Encounter (Signed)
Patient information for Albert Eaton is showing as not matching what they have in their system. Please have patient contact plan and confirm/correct name, date of birth, zip code, etc.

## 2023-10-16 NOTE — Telephone Encounter (Signed)
He has supraventricular tachycardia, so the Xopenex would be better since it does not affect the heart as much.   Malachi Bonds, MD [facility]

## 2023-10-18 DIAGNOSIS — N186 End stage renal disease: Secondary | ICD-10-CM | POA: Diagnosis not present

## 2023-10-18 DIAGNOSIS — N2581 Secondary hyperparathyroidism of renal origin: Secondary | ICD-10-CM | POA: Diagnosis not present

## 2023-10-18 DIAGNOSIS — Z992 Dependence on renal dialysis: Secondary | ICD-10-CM | POA: Diagnosis not present

## 2023-10-19 ENCOUNTER — Telehealth: Payer: Self-pay | Admitting: Pulmonary Disease

## 2023-10-19 ENCOUNTER — Telehealth: Payer: Self-pay | Admitting: Internal Medicine

## 2023-10-19 ENCOUNTER — Telehealth: Payer: Self-pay

## 2023-10-19 ENCOUNTER — Other Ambulatory Visit: Payer: Self-pay | Admitting: Nurse Practitioner

## 2023-10-19 ENCOUNTER — Other Ambulatory Visit (HOSPITAL_COMMUNITY): Payer: Self-pay

## 2023-10-19 ENCOUNTER — Other Ambulatory Visit: Payer: Self-pay | Admitting: Cardiovascular Disease

## 2023-10-19 DIAGNOSIS — N186 End stage renal disease: Secondary | ICD-10-CM | POA: Diagnosis not present

## 2023-10-19 DIAGNOSIS — I480 Paroxysmal atrial fibrillation: Secondary | ICD-10-CM

## 2023-10-19 DIAGNOSIS — Z992 Dependence on renal dialysis: Secondary | ICD-10-CM | POA: Diagnosis not present

## 2023-10-19 DIAGNOSIS — N2581 Secondary hyperparathyroidism of renal origin: Secondary | ICD-10-CM | POA: Diagnosis not present

## 2023-10-19 MED ORDER — LEVOTHYROXINE SODIUM 25 MCG PO TABS: 25.0000 ug | ORAL_TABLET | Freq: Every day | ORAL | 1 refills | Status: DC

## 2023-10-19 MED ORDER — METOPROLOL TARTRATE 25 MG PO TABS: 25.0000 mg | ORAL_TABLET | Freq: Two times a day (BID) | ORAL | 1 refills | Status: DC

## 2023-10-19 MED ORDER — ROSUVASTATIN CALCIUM 10 MG PO TABS: 10.0000 mg | ORAL_TABLET | Freq: Every day | ORAL | 1 refills | Status: AC

## 2023-10-19 MED ORDER — LEVALBUTEROL TARTRATE 45 MCG/ACT IN AERO: 2.0000 | INHALATION_SPRAY | RESPIRATORY_TRACT | 1 refills | Status: DC | PRN

## 2023-10-19 MED ORDER — EZETIMIBE 10 MG PO TABS: 10.0000 mg | ORAL_TABLET | Freq: Every day | ORAL | 1 refills | Status: DC

## 2023-10-19 MED ORDER — ALLOPURINOL 100 MG PO TABS: 100.0000 mg | ORAL_TABLET | Freq: Every day | ORAL | 0 refills | Status: DC

## 2023-10-19 MED ORDER — AMLODIPINE BESYLATE 2.5 MG PO TABS: 2.5000 mg | ORAL_TABLET | Freq: Every day | ORAL | 1 refills | Status: DC

## 2023-10-19 MED ORDER — LUBIPROSTONE 24 MCG PO CAPS: 24.0000 ug | ORAL_CAPSULE | Freq: Two times a day (BID) | ORAL | 0 refills | Status: DC

## 2023-10-19 MED ORDER — APIXABAN 5 MG PO TABS: 5.0000 mg | ORAL_TABLET | Freq: Two times a day (BID) | ORAL | 1 refills | Status: DC

## 2023-10-19 NOTE — Telephone Encounter (Signed)
Pharmacy Patient Advocate Encounter   Received notification from CoverMyMeds that prior authorization for Levalbuterol Tartrate 45MCG/ACT aerosol is required/requested.   Insurance verification completed.   The patient is insured through [place] .   Per test claim: PA required; PA submitted to above mentioned insurance via CoverMyMeds Key/confirmation #/EOC [facility] Status is pending

## 2023-10-19 NOTE — Telephone Encounter (Signed)
Pt's medications were sent to pt's pharmacy as requested. Confirmation received.  

## 2023-10-19 NOTE — Telephone Encounter (Signed)
[place] approved pharmacy notified

## 2023-10-19 NOTE — Telephone Encounter (Signed)
Prescription refill request for Eliquis received. Indication:AFIB Last office visit:8/24 Scr:13.11  12/24 Age: 57 Weight:90.7  kg  Prescription refilled

## 2023-10-19 NOTE — Telephone Encounter (Signed)
Pharmacy Patient Advocate Encounter  Received notification from [facility] that Prior Authorization for Levalbuterol Tartrate 45MCG/ACT aerosol has been APPROVED from 10-19-2023 to 10-25-2024   PA #/Case ID/Reference #: [id number]

## 2023-10-19 NOTE — Telephone Encounter (Signed)
Prescription sent electronically to [facility].

## 2023-10-19 NOTE — Telephone Encounter (Signed)
[facility] rep called and stated that they new prescription for Lubiprostone 24 MCG. Please advise.

## 2023-10-19 NOTE — Telephone Encounter (Signed)
*  STAT* If patient is at the pharmacy, call can be transferred to refill team.   1. Which medications need to be refilled? (please list name of each medication and dose if known) ?  all prescription prescribe by our office   2. Which pharmacy/location (including street and city if local pharmacy) is medication to be sent to? [facility] - [place], [place] [address]   3. Do they need a 30 day or 90 day supply? 90

## 2023-10-19 NOTE — Telephone Encounter (Signed)
Copied from CRM [id number]. Topic: Clinical - Medication Refill >> Oct 19, 2023 10:21 AM Louie Casa B wrote: Most Recent Primary Care Visit:  Provider: Maryan Puls  Department: [facility]  Visit Type: MEDICARE AWV, SEQUENTIAL  Date: 07/13/2023  Medication: amlodipine 2.5 mg  Has the patient contacted their pharmacy? Yes (Agent: If no, request that the patient contact the pharmacy for the refill. If patient does not wish to contact the pharmacy document the reason why and proceed with request.) (Agent: If yes, when and what did the pharmacy advise?)  Is this the correct pharmacy for this prescription? Yes If no, delete pharmacy and type the correct one.  This is the patient's preferred pharmacy:    [facility] - [place], [place] - [address] [place] [address] Phone: [phone] Fax: [phone]   Has the prescription been filled recently? Yes  Is the patient out of the medication? Yes  Has the patient been seen for an appointment in the last year OR does the patient have an upcoming appointment? Yes  Can we respond through MyChart? No  Agent: Please be advised that Rx refills may take up to 3 business days. We ask that you follow-up with your pharmacy.

## 2023-10-19 NOTE — Telephone Encounter (Addendum)
Albert Eaton is calling in for a new prescription to be sent into [facility] for rOPINIRole (REQUIP) 1 MG tablet [[id number]]  [phone] cb#: [phone] direct

## 2023-10-20 ENCOUNTER — Telehealth: Payer: Self-pay

## 2023-10-20 NOTE — Telephone Encounter (Signed)
 I have updated appointment notes to reflect pre-op clearance.

## 2023-10-20 NOTE — Telephone Encounter (Signed)
   Pre-operative Risk Assessment    Patient Name: Albert Eaton  DOB: 04-Jan-1966 MRN: [id number]     Request for Surgical Clearance    Procedure:  Inspire Implant   Date of Surgery:  Clearance 11/26/23                                 Surgeon:  Dr. Marijo Conception Group or Practice Name:  [facility] Phone number:  [phone] Fax number:  [phone]   Type of Clearance Requested:   - Medical    Type of Anesthesia:  Not Indicated   Additional requests/questions:    Scarlette Shorts   10/20/2023, 11:24 AM

## 2023-10-20 NOTE — Telephone Encounter (Signed)
Pt has appointment with Dr. Excell Seltzer on 10/29/23. Please make sure preoperative clearance is in appointment notes.   Please refer to clearance notes on 09/15/2023 for holding [facility] recommendations.

## 2023-10-22 ENCOUNTER — Other Ambulatory Visit: Payer: Self-pay

## 2023-10-22 NOTE — Telephone Encounter (Signed)
Copied from CRM [id number]. Topic: Clinical - Prescription Issue >> Oct 22, 2023  2:48 PM Samuel Jester B wrote: Reason for CRM: Izetta Dakin called from [facility]  regarding medications levothyroxine and allopurinol, she stated that the patient is needing two new prescriptions written to have these medications give to the pt. Callback number will be [phone]

## 2023-10-23 ENCOUNTER — Other Ambulatory Visit: Payer: Self-pay | Admitting: Nurse Practitioner

## 2023-10-23 ENCOUNTER — Telehealth: Payer: Self-pay | Admitting: Pulmonary Disease

## 2023-10-23 DIAGNOSIS — Z992 Dependence on renal dialysis: Secondary | ICD-10-CM | POA: Diagnosis not present

## 2023-10-23 DIAGNOSIS — N2581 Secondary hyperparathyroidism of renal origin: Secondary | ICD-10-CM | POA: Diagnosis not present

## 2023-10-23 DIAGNOSIS — N186 End stage renal disease: Secondary | ICD-10-CM | POA: Diagnosis not present

## 2023-10-23 MED ORDER — ALLOPURINOL 100 MG PO TABS: 100.0000 mg | ORAL_TABLET | Freq: Every day | ORAL | 0 refills | Status: DC

## 2023-10-23 NOTE — Telephone Encounter (Signed)
Fax received from Dr. Jenne Pane with [facility] to perform a Inspire Impant on patient.  Patient needs surgery clearance. Surgery is 11/26/23. Patient was seen on 07/01/22. Office protocol is a risk assessment can be sent to surgeon if patient has been seen in 60 days or less.   Dr Val Eagle- you did not plan to see the pt back. Are you okay to clear him or will he need appt with [place]? Thanks!

## 2023-10-24 NOTE — Telephone Encounter (Signed)
Patient has called requesting a refill for Requip to be sent to [facility]. Last refill was 10/06/22, #90 with 2 refills.  Message routed to Dr. Wynona Neat to advise

## 2023-10-25 DIAGNOSIS — N186 End stage renal disease: Secondary | ICD-10-CM | POA: Diagnosis not present

## 2023-10-25 DIAGNOSIS — Z992 Dependence on renal dialysis: Secondary | ICD-10-CM | POA: Diagnosis not present

## 2023-10-25 DIAGNOSIS — N2581 Secondary hyperparathyroidism of renal origin: Secondary | ICD-10-CM | POA: Diagnosis not present

## 2023-10-26 ENCOUNTER — Other Ambulatory Visit: Payer: Self-pay | Admitting: Pulmonary Disease

## 2023-10-26 ENCOUNTER — Encounter: Payer: Self-pay | Admitting: Pulmonary Disease

## 2023-10-26 MED ORDER — ROPINIROLE HCL 1 MG PO TABS: 1.0000 mg | ORAL_TABLET | Freq: Every day | ORAL | 2 refills | Status: AC

## 2023-10-27 ENCOUNTER — Encounter (HOSPITAL_COMMUNITY): Payer: Self-pay | Admitting: Emergency Medicine

## 2023-10-27 ENCOUNTER — Other Ambulatory Visit: Payer: Self-pay

## 2023-10-27 ENCOUNTER — Telehealth: Payer: Self-pay

## 2023-10-27 ENCOUNTER — Emergency Department (HOSPITAL_COMMUNITY): Admit: 2023-10-27 | Payer: Medicare PPO | Source: Home / Self Care

## 2023-10-27 ENCOUNTER — Emergency Department (HOSPITAL_COMMUNITY): Payer: Medicare PPO

## 2023-10-27 ENCOUNTER — Emergency Department (HOSPITAL_COMMUNITY)
Admission: EM | Admit: 2023-10-27 | Discharge: 2023-10-27 | Disposition: A | Discharge status: Home / Self Care | Payer: Medicare PPO | Attending: Emergency Medicine | Admitting: Emergency Medicine

## 2023-10-27 DIAGNOSIS — Z85528 Personal history of other malignant neoplasm of kidney: Secondary | ICD-10-CM | POA: Insufficient documentation

## 2023-10-27 DIAGNOSIS — I129 Hypertensive chronic kidney disease with stage 1 through stage 4 chronic kidney disease, or unspecified chronic kidney disease: Secondary | ICD-10-CM | POA: Diagnosis not present

## 2023-10-27 DIAGNOSIS — I491 Atrial premature depolarization: Secondary | ICD-10-CM | POA: Diagnosis not present

## 2023-10-27 DIAGNOSIS — Z87891 Personal history of nicotine dependence: Secondary | ICD-10-CM | POA: Diagnosis not present

## 2023-10-27 DIAGNOSIS — Z7901 Long term (current) use of anticoagulants: Secondary | ICD-10-CM | POA: Insufficient documentation

## 2023-10-27 DIAGNOSIS — Z992 Dependence on renal dialysis: Secondary | ICD-10-CM | POA: Insufficient documentation

## 2023-10-27 DIAGNOSIS — Z21 Asymptomatic human immunodeficiency virus [HIV] infection status: Secondary | ICD-10-CM | POA: Diagnosis not present

## 2023-10-27 DIAGNOSIS — Z79899 Other long term (current) drug therapy: Secondary | ICD-10-CM | POA: Diagnosis not present

## 2023-10-27 DIAGNOSIS — R0789 Other chest pain: Secondary | ICD-10-CM | POA: Diagnosis not present

## 2023-10-27 DIAGNOSIS — R079 Chest pain, unspecified: Secondary | ICD-10-CM | POA: Diagnosis not present

## 2023-10-27 DIAGNOSIS — N186 End stage renal disease: Secondary | ICD-10-CM | POA: Diagnosis not present

## 2023-10-27 DIAGNOSIS — R0989 Other specified symptoms and signs involving the circulatory and respiratory systems: Secondary | ICD-10-CM | POA: Diagnosis not present

## 2023-10-27 LAB — BASIC METABOLIC PANEL
Anion gap: 16 — ABNORMAL HIGH (ref 5–15)
BUN: 34 mg/dL — ABNORMAL HIGH (ref 6–20)
CO2: 22 mmol/L (ref 22–32)
Calcium: 7.8 mg/dL — ABNORMAL LOW (ref 8.9–10.3)
Chloride: 97 mmol/L — ABNORMAL LOW (ref 98–111)
Creatinine, Ser: 10.68 mg/dL — ABNORMAL HIGH (ref 0.61–1.24)
GFR, Estimated: 5 mL/min — ABNORMAL LOW (ref >60)
Glucose, Bld: 91 mg/dL (ref 70–99)
Potassium: 3.8 mmol/L (ref 3.5–5.1)
Sodium: 135 mmol/L (ref 135–145)

## 2023-10-27 LAB — CBC
HCT: 31.1 % — ABNORMAL LOW (ref 39.0–52.0)
Hemoglobin: 10.7 g/dL — ABNORMAL LOW (ref 13.0–17.0)
MCH: 31.9 pg (ref 26.0–34.0)
MCHC: 34.4 g/dL (ref 30.0–36.0)
MCV: 92.8 fL (ref 80.0–100.0)
Platelets: 231 10*3/uL (ref 150–400)
RBC: 3.35 MIL/uL — ABNORMAL LOW (ref 4.22–5.81)
RDW: 13.7 % (ref 11.5–15.5)
WBC: 5.8 10*3/uL (ref 4.0–10.5)
nRBC: 0 % (ref 0.0–0.2)

## 2023-10-27 LAB — TROPONIN I (HIGH SENSITIVITY)
Troponin I (High Sensitivity): 28 ng/L — ABNORMAL HIGH (ref <18)
Troponin I (High Sensitivity): 35 ng/L — ABNORMAL HIGH (ref <18)

## 2023-10-27 MED ORDER — LEVOTHYROXINE SODIUM 25 MCG PO TABS: 25.0000 ug | ORAL_TABLET | Freq: Every day | ORAL | 1 refills | Status: DC

## 2023-10-27 NOTE — ED Provider Notes (Signed)
 [place] [facility] Provider Note   CSN: [id number] Arrival date & time: 10/27/23  9291     History  Chief Complaint  Patient presents with   Chest Pain    Albert Eaton is a 57 y.o. male.  HPI 57 year old male history of end-stage renal disease on dialysis, status post kidney removal due to kidney cancer, HIV on antiretroviral treatment presents today complaining of left side parasternal pain.  He reports it was present on awakening at 5 AM with little bit of a dull ache.  It is worse with movement and palpation.  He thinks it is musculoskeletal.  He reports that he went to dialysis and they told him he needed to come to the ED.  He is normally Monday Wednesday Friday dialysis, but due to the holiday week he is on a Sunday Tuesday Thursday schedule.  He was last dialyzed on Sunday.  He is not having any dyspnea, fever, cough, leg swelling.  Dialysis graft in his and left upper arm.  He is going through workup for kidney transplant.  He reports that he had a cardiac arrest 2 years ago that was thought to be due to electrolyte abnormalities.  He reports he has had multiple cardiac catheterizations but does not think he has had any known coronary artery disease.  He was a former smoker and quit in 2000.  He reports taking his antiretroviral therapy regularly.  He reports taking all of his medications this morning.     Home Medications Prior to Admission medications   Medication Sig Start Date End Date Taking? Authorizing Provider  acetaminophen  (ACETAMINOPHEN  8 HOUR) 650 MG CR tablet Take 1,300 mg by mouth every 8 (eight) hours as needed for pain. 12/26/22 12/25/23  [provider]  allopurinol  (ZYLOPRIM ) 100 MG tablet Take 1 tablet (100 mg total) by mouth daily. 10/23/23   [name], NP  amLODipine  (NORVASC ) 2.5 MG tablet Take 1 tablet (2.5 mg total) by mouth daily. 10/19/23   Wonda Sharper, MD  apixaban  (ELIQUIS ) 5 MG TABS tablet Take 1  tablet (5 mg total) by mouth 2 (two) times daily. 10/19/23   Wonda Sharper, MD  azelastine  (ASTELIN ) 0.1 % nasal spray Place 2 sprays into both nostrils 2 (two) times daily as needed for allergies. 04/01/23   Iva Marty Saltness, MD  B Complex-C-Zn-Folic Acid  (DIALYVITE  800-ZINC  15 PO) Take 1 tablet by mouth daily.    [provider]  bictegravir-emtricitabine -tenofovir  AF (BIKTARVY ) 50-200-25 MG TABS tablet Take 1 tablet by mouth daily. 07/28/23   Comer, Lamar ORN, MD  cetirizine  (ZYRTEC ) 10 MG tablet Take 1 tablet (10 mg total) by mouth daily. 04/20/23   Iva Marty Saltness, MD  diclofenac  Sodium (VOLTAREN ) 1 % GEL Apply 2 g topically 4 (four) times daily. Apply to chest pain when lidocaine  patch not in place Patient taking differently: Apply 2 g topically 4 (four) times daily as needed (pain). 09/14/22   Bryn Bernardino NOVAK, MD  ezetimibe  (ZETIA ) 10 MG tablet Take 1 tablet (10 mg total) by mouth daily. 10/19/23   Wonda Sharper, MD  famotidine  (PEPCID ) 20 MG tablet Take 1 tablet (20 mg total) by mouth 2 (two) times daily. 11/13/22   Iva Marty Saltness, MD  ferric citrate  (AURYXIA ) 1 GM 210 MG(Fe) tablet Take 420 mg by mouth See admin instructions. Take 420 mg by mouth three times a day with meals and 210 mg with each snack    [provider]  fluticasone  (FLOVENT  HFA) 110 MCG/ACT inhaler Inhale 2 puffs into the lungs 2 (two) times daily as needed (for flares).    [provider]  gabapentin  (NEURONTIN ) 300 MG capsule Take 1 capsule (300 mg total) by mouth daily as needed (pain/neuropathy). 09/14/22   Bryn Bernardino NOVAK, MD  heparin  sodium, porcine, 1000 UNIT/ML injection Inject 1,000 Units into the vein once. 12/26/22 12/25/23  [provider]  ipratropium (ATROVENT ) 0.06 % nasal spray USE TWO SPRAYS IN EACH NOSTRIL two TO THREE times daily as needed 05/20/23   Iva Marty Saltness, MD  lactulose  (CHRONULAC ) 10 GM/15ML solution Take 45 mLs (30 g total) by mouth daily as  needed. Take 15-30 ml up to twice daily for constipation Patient taking differently: Take 40 g by mouth daily as needed. 01/08/23   Pyrtle, Gordy HERO, MD  levalbuterol  (XOPENEX  HFA) 45 MCG/ACT inhaler Inhale TWO puffs by MOUTH every FOUR (four) hours as needed FOR wheezing.] 02/16/23   Iva Marty Saltness, MD  levalbuterol  (XOPENEX  HFA) 45 MCG/ACT inhaler Inhale 2 puffs into the lungs every 4 (four) hours as needed for wheezing. 10/19/23   Iva Marty Saltness, MD  levothyroxine  (SYNTHROID ) 25 MCG tablet Take 1 tablet (25 mcg total) by mouth daily before breakfast. 10/19/23   [name], NP  lubiprostone  (AMITIZA ) 24 MCG capsule Take 1 capsule (24 mcg total) by mouth 2 (two) times daily with a meal. NEEDS APPT FOR FURTHER REFILLS 10/19/23   Pyrtle, Gordy HERO, MD  Methoxy PEG-Epoetin  Beta (MIRCERA IJ) Inject 1 each into the skin every 30 (thirty) days. Administered at dialysis center once a month 11/08/20   [provider]  metoprolol  tartrate (LOPRESSOR ) 25 MG tablet Take 1 tablet (25 mg total) by mouth 2 (two) times daily. 10/19/23   Cooper, Michael, MD  Olopatadine  HCl (PATADAY ) 0.7 % SOLN Place 1 drop into both eyes 2 (two) times daily as needed (allergies). 11/13/22   Iva Marty Saltness, MD  pantoprazole  (PROTONIX ) 40 MG tablet Take 1 tablet (40 mg total) by mouth daily. 06/01/23   Iva Marty Saltness, MD  rOPINIRole  (REQUIP ) 1 MG tablet Take 1 tablet (1 mg total) by mouth at bedtime. 10/26/23   Neda Jennet LABOR, MD  rosuvastatin  (CRESTOR ) 10 MG tablet Take 1 tablet (10 mg total) by mouth daily. 10/19/23   Wonda Sharper, MD  SENSIPAR  60 MG tablet Take 60 mg by mouth daily. 06/25/22   [provider]  sevelamer  carbonate (RENVELA ) 800 MG tablet Take 2,400 mg by mouth 3 (three) times daily. 04/22/23   [provider]  Spacer/Aero-Holding Raguel DEVI 1 Device by Does not apply route 2 (two) times a day. 03/01/19   Bobbitt, Elgin Pepper, MD  SYMBICORT  160-4.5 MCG/ACT inhaler  Inhale 2 puffs into the lungs in the morning and at bedtime. 07/01/23   Iva Marty Saltness, MD  tiZANidine  (ZANAFLEX ) 2 MG tablet Take 1 tablet (2 mg total) by mouth 2 (two) times daily as needed for muscle spasms. 07/07/23   [name], NP  Vitamin D , Ergocalciferol , (DRISDOL ) 1.25 MG (50000 UNIT) CAPS capsule Take 1 capsule (50,000 Units total) by mouth every Tuesday. 01/06/23   [name], NP      Allergies    Ace inhibitors    Review of Systems   Review of Systems  Physical Exam Updated Vital Signs BP 114/78   Pulse 66   Temp 99 F (37.2 C) (Oral)   Resp 16   Ht 1.778 m (5' 10)  Wt 88.7 kg   SpO2 99%   BMI 28.06 kg/m  Physical Exam Constitutional:      General: He is not in acute distress.    Appearance: Normal appearance.  HENT:     Head: Normocephalic.     Right Ear: External ear normal.     Left Ear: External ear normal.     Nose: Nose normal.     Mouth/Throat:     Mouth: Mucous membranes are dry.     Pharynx: Oropharynx is clear.  Eyes:     Extraocular Movements: Extraocular movements intact.     Pupils: Pupils are equal, round, and reactive to light.  Cardiovascular:     Rate and Rhythm: Normal rate and regular rhythm.     Pulses: Normal pulses.     Heart sounds: Normal heart sounds.  Pulmonary:     Effort: Pulmonary effort is normal.     Breath sounds: Normal breath sounds.  Abdominal:     General: Abdomen is flat. Bowel sounds are normal.     Palpations: Abdomen is soft.  Musculoskeletal:        General: No swelling or tenderness. Normal range of motion.     Cervical back: Normal range of motion.  Skin:    General: Skin is warm and dry.     Capillary Refill: Capillary refill takes less than 2 seconds.  Neurological:     General: No focal deficit present.     Mental Status: He is alert.  Psychiatric:        Mood and Affect: Mood normal.        Behavior: Behavior normal.     ED Results / Procedures / Treatments   Labs (all labs  ordered are listed, but only abnormal results are displayed) Labs Reviewed  BASIC METABOLIC PANEL - Abnormal; Notable for the following components:      Result Value   Chloride 97 (*)    BUN 34 (*)    Creatinine, Ser 10.68 (*)    Calcium  7.8 (*)    GFR, Estimated 5 (*)    Anion gap 16 (*)    All other components within normal limits  CBC - Abnormal; Notable for the following components:   RBC 3.35 (*)    Hemoglobin 10.7 (*)    HCT 31.1 (*)    All other components within normal limits  TROPONIN I (HIGH SENSITIVITY) - Abnormal; Notable for the following components:   Troponin I (High Sensitivity) 28 (*)    All other components within normal limits  TROPONIN I (HIGH SENSITIVITY) - Abnormal; Notable for the following components:   Troponin I (High Sensitivity) 35 (*)    All other components within normal limits    EKG EKG Interpretation Date/Time:  Tuesday October 27 2023 07:09:09 EST Ventricular Rate:  69 PR Interval:  179 QRS Duration:  107 QT Interval:  467 QTC Calculation: 501 R Axis:   60  Text Interpretation: Sinus rhythm Atrial premature complexes Prolonged QT interval No significant change since last tracing of 05 May 2023 Confirmed by Levander Houston [phone]) on 10/27/2023 7:11:04 AM  Radiology DG Chest 2 View Result Date: 10/27/2023 CLINICAL DATA:  Chest pain. History of end-stage renal disease, on dialysis EXAM: CHEST - 2 VIEW COMPARISON:  03/13/2023 FINDINGS: Unchanged enlarged cardiac silhouette and mediastinal contours post valve repair. Chronic pulmonary venous congestion without frank evidence of edema. Redemonstrated right basilar opacities favored to represent atelectasis or scarring. No new focal airspace opacities. No pleural  effusion or pneumothorax. No acute osseous abnormalities. Tiny linear radiopaque structure overlies the peripheral aspect of the right mid lung. IMPRESSION: Similar findings of cardiomegaly and chronic pulmonary venous congestion without  frank evidence of edema. Electronically Signed   By: Norleen Roulette M.D.   On: 10/27/2023 08:38    Procedures Procedures    Medications Ordered in ED Medications - No data to display  ED Course/ Medical Decision Making/ A&P Clinical Course as of 10/27/23 1108  Tue Oct 27, 2023  0902 Troponin reviewed slightly elevated at 28 may be baseline in this dialysis patient [DR]  0902 Reviewed to prior troponins of 30 [DR]  0903 Metabolic panel reviewed interpreted significant for creatinine 10.68 with normal potassium consistent with patient's chronic renal failure [DR]  0903 BC reviewed interpreted significant for hemoglobin of 10 [DR]  0903 Hemoglobin is slightly decreased from first prior of 11 [DR]  0903 Chest x-Jashawna Reever with similar findings of cardiomegaly and chronic pulmonary venous congestion without frank edema noted [DR]  1104 Troponin has remained relatively flat with repeat of 35 [DR]    Clinical Course User Index [DR] Levander Houston, MD                                 Medical Decision Making Amount and/or Complexity of Data Reviewed Labs: ordered. Radiology: ordered.   Patient seen and evaluated for chest pain.  Differential diagnosis of serious/life threatening causes of chest pain includes ACS, other diseases of the heart such as myocarditis or pericarditis, lung etiologies such as infection or pneumothorax, diseases of the great vessels such as aortic dissection or AAA, pulmonary embolism, or GI sources such as cholecystitis or other upper abdominal causes. Doubt ACS- heart score documented, EKG reviewed, Given the timing of pain to ER presentation, delta troponin* was * so doubt NSTEMI troponin and repeat troponin obtained and WNL Doubt myocarditis/pericarditis/tamponade based on history, review of ekg and labs Doubt aortic dissection based on history and review of imaging Doubt intrinsic lung causes such as pneumonia or pneumothorax, based on history, physical exam, and studies  obtained. Doubt PE based on history, physical exam Doubt acute GI etiology requiring intervention based on history, physical exam and labs. Patient appears stable for discharge. Return precautions and need for follow up discussed and patient voices understanding  Pain most consistent with ms etiology Patient understands plan and is in agreement.  Discussed need for follow-up and return precautions and he voices understanding       Final Clinical Impression(s) / ED Diagnoses Final diagnoses:  Chest pain, unspecified type    Rx / DC Orders ED Discharge Orders     None         Levander Houston, MD 10/27/23 1108

## 2023-10-27 NOTE — Addendum Note (Signed)
Addended by: [name] on: 10/27/2023 01:15 PM   Modules accepted: Orders

## 2023-10-27 NOTE — ED Notes (Signed)
Patient returned back from Xray. 

## 2023-10-27 NOTE — ED Triage Notes (Signed)
 Pt BIB [facility] from home due to left sided chest pain.  Pt states he woke up this morning with the chest pain.  Tender to touch and radiates to left side of neck.  Pt did receive 324 aspirin .  Pt has fistula on left bicep.  Pt was supposed to get dialysis today but did not receive treatment due to calling [facility] for chest pain.  20g IV hand.   [facility] VS BP 134/90 HR 78, SpO2 96% 12 lead EKG unremarkable.

## 2023-10-27 NOTE — Telephone Encounter (Signed)
Medication sent to pharmacy of request 

## 2023-10-27 NOTE — Telephone Encounter (Signed)
 Pt requests to have Levothyroxine 25 MCG sent to [facility] - [place], [place] - [address]   LAST REFILL: 10/19/23 QTY: 90 [id number]   LOV: 07/07/23 FOV: 07/18/24

## 2023-10-29 ENCOUNTER — Ambulatory Visit: Payer: 59 | Attending: Cardiovascular Disease | Admitting: Cardiovascular Disease

## 2023-10-29 ENCOUNTER — Encounter: Payer: Self-pay | Admitting: Cardiovascular Disease

## 2023-10-29 ENCOUNTER — Ambulatory Visit: Payer: Medicare PPO | Admitting: Cardiovascular Disease

## 2023-10-29 ENCOUNTER — Telehealth: Payer: Self-pay

## 2023-10-29 VITALS — BP 136/78 | HR 73 | Ht 70.0 in | Wt 206.8 lb

## 2023-10-29 DIAGNOSIS — I2510 Atherosclerotic heart disease of native coronary artery without angina pectoris: Secondary | ICD-10-CM | POA: Diagnosis not present

## 2023-10-29 DIAGNOSIS — Z9889 Other specified postprocedural states: Secondary | ICD-10-CM | POA: Diagnosis not present

## 2023-10-29 DIAGNOSIS — I5032 Chronic diastolic (congestive) heart failure: Secondary | ICD-10-CM | POA: Diagnosis not present

## 2023-10-29 DIAGNOSIS — I4892 Unspecified atrial flutter: Secondary | ICD-10-CM | POA: Insufficient documentation

## 2023-10-29 DIAGNOSIS — N186 End stage renal disease: Secondary | ICD-10-CM | POA: Insufficient documentation

## 2023-10-29 MED ORDER — WARFARIN SODIUM 5 MG PO TABS: 5.0000 mg | ORAL_TABLET | Freq: Every day | ORAL | 0 refills | Status: DC

## 2023-10-29 NOTE — Telephone Encounter (Signed)
 Called and spoke with pt. Confirmed he is aware of the need to monitor INR weekly and his upcoming appt with our Coumadin  Clinic on 11/03/23. Pt verbalized understanding and states he will start Warfarin 5mg  daily today and continue Eliquis  today, tomorrow, and Saturday and then discontinue Eliquis  and continue Warfarin.

## 2023-10-29 NOTE — Assessment & Plan Note (Addendum)
 Patient noted to have moderate nonobstructive coronary artery disease managed medically with an absence of angina.  Patient without high-grade obstructive disease on most recent cardiac catheterization.  He is managed with a statin drug.  He is not on aspirin  in the setting of chronic oral anticoagulation.

## 2023-10-29 NOTE — Patient Instructions (Signed)
 Medication Instructions:  Changing Eliquis  to Coumadin Albert Eaton Take Warfarin 5mg  daily starting today, continue both medications for 3 days, then discontinue Eliquis  and continue only Warfarin.  **F/u in coumadin  clinic Tuesday 11/03/23**  Testing/Procedures: Ambulatory Referral to Anticoagulant Clinic  Follow-Up: At [facility], you and your health needs are our priority.  As part of our continuing mission to provide you with exceptional heart care, we have created designated Provider Care Teams.  These Care Teams include your primary Cardiologist (physician) and Advanced Practice Providers (APPs -  Physician Assistants and Nurse Practitioners) who all work together to provide you with the care you need, when you need it.   Your next appointment:   1 year(s)  Provider:   [name], MD

## 2023-10-29 NOTE — Assessment & Plan Note (Signed)
 Awaiting renal transplantation

## 2023-10-29 NOTE — Assessment & Plan Note (Signed)
 Outpatient monitor reviewed and demonstrated normal sinus rhythm with a 2% burden of atrial flutter.  Patient is anticoagulated with apixaban .  Will switch him to warfarin as requested.  Will refer him to our anticoagulation clinic to make the transition.  I discussed some basic concepts and contrasted warfarin versus direct oral anticoagulant drugs.  He understands that he will need INR monitoring and will need to watch for both food and drug interactions.  He will receive further education from the anticoagulation clinic.

## 2023-10-29 NOTE — Assessment & Plan Note (Signed)
 Most recent echo reviewed with only trivial residual MR.

## 2023-10-29 NOTE — Assessment & Plan Note (Addendum)
 Volume managed with hemodialysis.  Clinically stable on his current medical program.  No changes are made today.  No signs of volume excess on exam.

## 2023-10-29 NOTE — Progress Notes (Addendum)
 Cardiology Office Note:    Date:  10/29/2023   ID:  Albert Eaton, DOB 1965/12/07, MRN [id number]  PCP:  [name], NP   [facility] Providers Cardiologist:  [name], Eaton Electrophysiologist:  [name], Eaton     Referring Eaton: [name], NP   Chief Complaint  Patient presents with   Atrial Fibrillation    History of Present Illness:    Albert Eaton is a 58 y.o. male presenting for follow-up evaluation.  His cardiovascular problems are outlined below:  Coronary artery disease, nonobstructive  LHC 09/11/2022: LAD proximal 50, mid 50; OM2 30; RCA proximal 40 HFmrEF (heart failure with mildly reduced ejection fraction)  TTE with bubble study 08/22/2022: EF 45-50, global HK, mild LVH, GR 2 DD, RV pressure and volume overload, mild reduced RVSF, moderately elevated PASP, RVSP 50, severe BAE, s/p MV repair with trivial MR, severe TR, RAP 15 Limited TTE 09/09/2022: EF 45-50, global HK, moderate LVH, moderate RVE, s/p MV repair with trivial MR, trivial TR Limited TTE 02/17/2023: No pericardial effusion, severe LAE, s/p mitral valve repair, moderate TR, ascending aorta 39 mm Paroxysmal atrial fibrillation  Amiodarone , Apixaban  Mitral regurgitation S/p MV repair (min inv) 07/2022 ([name]) Tricuspid regurgitation Hx of PEA arrest  Occurred during dialysis 08/2022 - ?initial rhythm SVT vs AFib Monitor 09/2022: NSR, A-fib burden <1% (controlled rate and RVR), no high-grade AV block, few short ventricular runs  ESRD on hemodialysis (MWF) Hypertension  Hyperlipidemia  Aortic atherosclerosis Hypothyroidism  OSA  HIV Renal CA s/p L nephrectomy  Hep B Pre-CABG Dopplers 08/19/2022: Bilateral ICA with minimal wall thickening   The patient is here alone today. He has been doing well from a cardiac perspective.  He is now listed for kidney transplant both at [facility] and [facility].  He tells me that both health systems would like him to come off of  apixaban  and transition to warfarin.  They prefer warfarin anticoagulation and end-stage renal disease awaiting transplantation so that they can reverse anticoagulation for surgery.  Otherwise he reports no significant change in symptoms.  He is doing okay from a cardiac perspective and denies chest pain or shortness of breath.  He still has occasional heart palpitations but reports no change in frequency.  No lightheadedness or syncope.  He is doing pretty well with dialysis and he is able to drive himself to and from his dialysis sessions on Mondays, Wednesdays, and Fridays.  He reports no edema, orthopnea, or PND.  Current Medications: Current Meds  Medication Sig   acetaminophen  (ACETAMINOPHEN  8 HOUR) 650 MG CR tablet Take 1,300 mg by mouth every 8 (eight) hours as needed for pain.   allopurinol  (ZYLOPRIM ) 100 MG tablet Take 1 tablet (100 mg total) by mouth daily.   amLODipine  (NORVASC ) 2.5 MG tablet Take 1 tablet (2.5 mg total) by mouth daily.   apixaban  (ELIQUIS ) 5 MG TABS tablet Take 1 tablet (5 mg total) by mouth 2 (two) times daily.   azelastine  (ASTELIN ) 0.1 % nasal spray Place 2 sprays into both nostrils 2 (two) times daily as needed for allergies.   B Complex-C-Zn-Folic Acid  (DIALYVITE  800-ZINC  15 PO) Take 1 tablet by mouth daily.   bictegravir-emtricitabine -tenofovir  AF (BIKTARVY ) 50-200-25 MG TABS tablet Take 1 tablet by mouth daily.   cetirizine  (ZYRTEC ) 10 MG tablet Take 1 tablet (10 mg total) by mouth daily.   diclofenac  Sodium (VOLTAREN ) 1 % GEL Apply 2 g topically 4 (four) times daily. Apply to  chest pain when lidocaine  patch not in place (Patient taking differently: Apply 2 g topically 4 (four) times daily as needed (pain).)   ezetimibe  (ZETIA ) 10 MG tablet Take 1 tablet (10 mg total) by mouth daily.   famotidine  (PEPCID ) 20 MG tablet Take 1 tablet (20 mg total) by mouth 2 (two) times daily.   ferric citrate  (AURYXIA ) 1 GM 210 MG(Fe) tablet Take 420 mg by mouth See admin  instructions. Take 420 mg by mouth three times a day with meals and 210 mg with each snack   fluticasone  (FLOVENT  HFA) 110 MCG/ACT inhaler Inhale 2 puffs into the lungs 2 (two) times daily as needed (for flares).   gabapentin  (NEURONTIN ) 300 MG capsule Take 1 capsule (300 mg total) by mouth daily as needed (pain/neuropathy).   heparin  sodium, porcine, 1000 UNIT/ML injection Inject 1,000 Units into the vein once.   ipratropium (ATROVENT ) 0.06 % nasal spray USE TWO SPRAYS IN EACH NOSTRIL two TO THREE times daily as needed   lactulose  (CHRONULAC ) 10 GM/15ML solution Take 45 mLs (30 g total) by mouth daily as needed. Take 15-30 ml up to twice daily for constipation (Patient taking differently: Take 40 g by mouth daily as needed.)   levalbuterol  (XOPENEX  HFA) 45 MCG/ACT inhaler Inhale TWO puffs by MOUTH every FOUR (four) hours as needed FOR wheezing.]   levalbuterol  (XOPENEX  HFA) 45 MCG/ACT inhaler Inhale 2 puffs into the lungs every 4 (four) hours as needed for wheezing.   levothyroxine  (SYNTHROID ) 25 MCG tablet Take 1 tablet (25 mcg total) by mouth daily before breakfast.   lubiprostone  (AMITIZA ) 24 MCG capsule Take 1 capsule (24 mcg total) by mouth 2 (two) times daily with a meal. NEEDS APPT FOR FURTHER REFILLS   Methoxy PEG-Epoetin  Beta (MIRCERA IJ) Inject 1 each into the skin every 30 (thirty) days. Administered at dialysis center once a month   metoprolol  tartrate (LOPRESSOR ) 25 MG tablet Take 1 tablet (25 mg total) by mouth 2 (two) times daily.   Olopatadine  HCl (PATADAY ) 0.7 % SOLN Place 1 drop into both eyes 2 (two) times daily as needed (allergies).   pantoprazole  (PROTONIX ) 40 MG tablet Take 1 tablet (40 mg total) by mouth daily.   rOPINIRole  (REQUIP ) 1 MG tablet Take 1 tablet (1 mg total) by mouth at bedtime.   rosuvastatin  (CRESTOR ) 10 MG tablet Take 1 tablet (10 mg total) by mouth daily.   SENSIPAR  60 MG tablet Take 60 mg by mouth daily.   sevelamer  carbonate (RENVELA ) 800 MG tablet Take  2,400 mg by mouth 3 (three) times daily.   Spacer/Aero-Holding Chambers DEVI 1 Device by Does not apply route 2 (two) times a day.   SYMBICORT  160-4.5 MCG/ACT inhaler Inhale 2 puffs into the lungs in the morning and at bedtime.   tiZANidine  (ZANAFLEX ) 2 MG tablet Take 1 tablet (2 mg total) by mouth 2 (two) times daily as needed for muscle spasms.   Vitamin D , Ergocalciferol , (DRISDOL ) 1.25 MG (50000 UNIT) CAPS capsule Take 1 capsule (50,000 Units total) by mouth every Tuesday.   warfarin (COUMADIN ) 5 MG tablet Take 1 tablet (5 mg total) by mouth daily.     Allergies:   Ace inhibitors   ROS:   Please see the history of present illness.    All other systems reviewed and are negative.  EKGs/Labs/Other Studies Reviewed:    The following studies were reviewed today: Cardiac Studies & Procedures   CARDIAC CATHETERIZATION  CARDIAC CATHETERIZATION 09/11/2022  Narrative   Mid LAD lesion  is 50% stenosed.   Prox LAD lesion is 50% stenosed.   Prox RCA lesion is 40% stenosed.   2nd Mrg lesion is 30% stenosed.   LV end diastolic pressure is mildly elevated.   There is no aortic valve stenosis.  No change in coronary anatomy since October 2023. Consider EP referral for consideration of AICD.  Findings Coronary Findings Diagnostic  Dominance: Left  Left Anterior Descending Vessel is large. The LAD wraps around the LV apex.  There is moderate diffuse 40 to 50% proximal LAD stenosis.  The mid and distal LAD have no obstructive disease. Prox LAD lesion is 50% stenosed. Mid LAD lesion is 50% stenosed.  Left Circumflex Vessel is large. The vessel exhibits minimal luminal irregularities. The circumflex is dominant.  There is mild nonobstructive plaquing throughout.  There are no high-grade stenoses throughout the circumflex, obtuse marginal, or posterolateral branches.  Second Obtuse Marginal Branch 2nd Mrg lesion is 30% stenosed.  Right Coronary Artery Vessel is moderate in size.  Nondominant vessel.  Mild nonobstructive proximal vessel disease of about 40%. Prox RCA lesion is 40% stenosed.  Intervention  No interventions have been documented.   CARDIAC CATHETERIZATION  CARDIAC CATHETERIZATION 08/01/2022  Narrative 1.  Mildly elevated pulmonary wedge pressure and PA pressure with mean wedge pressure of 17 mmHg and mean PA pressure of 25 mmHg, high cardiac output likely related to Fick measurement inpatient with AV fistula 2.  Stable moderate nonobstructive coronary artery disease with patency of the left main, moderate 50% proximal LAD stenosis unchanged from the previous study, mild nonobstructive left circumflex stenosis (dominant vessel), and mild nonobstructive stenosis of a nondominant RCA  Recommend: Continue evaluation per [name] with plans for surgical valve repair.  Findings Coronary Findings Diagnostic  Dominance: Left  Left Anterior Descending Vessel is large. The LAD wraps around the LV apex.  There is moderate diffuse 40 to 50% proximal LAD stenosis.  The mid and distal LAD have no obstructive disease. Prox LAD lesion is 50% stenosed. Mid LAD lesion is 50% stenosed.  Left Circumflex Vessel is large. The circumflex is dominant.  There is mild nonobstructive plaquing throughout.  There are no high-grade stenoses throughout the circumflex, obtuse marginal, or posterolateral branches.  Second Obtuse Marginal Branch 2nd Mrg lesion is 30% stenosed.  Right Coronary Artery Vessel is moderate in size. Nondominant vessel.  Mild nonobstructive proximal vessel disease of about 40%. Prox RCA lesion is 40% stenosed.  Intervention  No interventions have been documented.   STRESS TESTS  MYOCARDIAL PERFUSION IMAGING 01/31/2020  Narrative  Nuclear stress EF: 51%. The left ventricular ejection fraction is mildly decreased (45-54%). The LV appears to be mildly dilated.  There was no ST segment deviation noted during stress.  This is a low risk  study. There is no evidence of ischemia or previous infarction.  The study is normal.  ECHOCARDIOGRAM  ECHOCARDIOGRAM LIMITED 02/17/2023  Narrative ECHOCARDIOGRAM LIMITED REPORT    Patient Name:   Albert Eaton Date of Exam: 02/17/2023 Medical Rec #:  [id number]       Height:       70.0 in Accession #:    [id number]      Weight:       201.2 lb Date of Birth:  September 04, 1966        BSA:          2.093 m Patient Age:    56 years        BP:  130/80 mmHg Patient Gender: M               HR:           69 bpm. Exam Location:  [facility]  Procedure: Limited Echo, Limited Color Doppler and Color Doppler  Indications:    R07.9* Chest pain, unspecified R/O pericardial effusion  History:        Patient has prior history of Echocardiogram examinations, most recent 09/09/2022. CHF, CAD and H/O PEA arrest, ESRD-hemodialysis HIV, Arrythmias:Atrial Fibrillation; Risk Factors:Hypertension and Dyslipidemia. Previous echo revealed LVEF 50%.  Mitral Valve: 30 mm Medtronic prosthetic annuloplasty ring. valve is present in the mitral position. Procedure Date: 07/2022.  Sonographer:    Nolon Berg BA, RDCS Referring Phys: [id number] GLENDIA DASEN WEAVER  IMPRESSIONS   1. No pericardial effusion. 2. Left atrial size was severely dilated. 3. Post mitral repair with trivial residual MR . The mitral valve has been repaired/replaced. There is a 30 mm Medtronic prosthetic annuloplasty ring. present in the mitral position. Procedure Date: 07/2022. 4. Tricuspid valve regurgitation is moderate. 5. The aortic valve is tricuspid. Aortic valve regurgitation is not visualized. No aortic stenosis is present. 6. Aortic dilatation noted. There is mild dilatation of the ascending aorta, measuring 39 mm.  FINDINGS Left [facility]: Left atrial size was severely dilated.  Pericardium: There is no evidence of pericardial effusion.  Mitral Valve: Post mitral repair with trivial residual MR. The mitral valve has been  repaired/replaced. There is a 30 mm Medtronic prosthetic annuloplasty ring. present in the mitral position. Procedure Date: 07/2022.  Tricuspid Valve: Tricuspid valve regurgitation is moderate.  Aortic Valve: The aortic valve is tricuspid. Aortic valve regurgitation is not visualized. No aortic stenosis is present.  Pulmonic Valve: The pulmonic valve was normal in structure. Pulmonic valve regurgitation is trivial.  Aorta: Aortic dilatation noted. There is mild dilatation of the ascending aorta, measuring 39 mm.  Additional Comments: No pericardial effusion.  LEFT VENTRICLE PLAX 2D LVIDd:         5.50 cm LVIDs:         3.60 cm LV PW:         1.20 cm LV IVS:        1.10 cm LVOT diam:     2.20 cm LVOT Area:     3.80 cm   IVC IVC diam: 1.40 cm  LEFT ATRIUM         Index LA diam:    5.80 cm 2.77 cm/m  AORTA Ao Root diam: 3.80 cm Ao Asc diam:  3.90 cm  TRICUSPID VALVE TR Peak grad:   19.0 mmHg TR Vmax:        218.00 cm/s  SHUNTS Systemic Diam: 2.20 cm  Albert Emmer Eaton Electronically signed by Albert Emmer Eaton Signature Date/Time: 02/17/2023/10:32:39 AM    Final  TEE  ECHO INTRAOPERATIVE TEE 08/21/2022  Narrative *INTRAOPERATIVE TRANSESOPHAGEAL REPORT *    Patient Name:   Albert Eaton Date of Exam: 08/21/2022 Medical Rec #:  [id number]       Height:       70.0 in Accession #:    [id number]      Weight:       206.0 lb Date of Birth:  1965/11/29        BSA:          2.11 m Patient Age:    56 years        BP:  122/81 mmHg Patient Gender: M               HR:           77 bpm. Exam Location:  Inpatient  Transesophogeal exam was perform intraoperatively during surgical procedure. Patient was closely monitored under general anesthesia during the entirety of examination.  Indications:     mitral regurgitation Sonographer:     Tinnie Barefoot RDCS Performing Phys: [id number] Albert Eaton Diagnosing Phys: Albert Eaton  Complications: No known  complications during this procedure. POST-OP IMPRESSIONS _ Mitral Valve: No stenosis present. There is trace regurgitation. The gradient recorded across the prosthetic valve is within the expected range. The mean PG across the repaired mitral valve is 3 mmHg.The mitral valve is status post repair with an annuloplasty ring.  PRE-OP  FINDINGS Left Ventricle: The left ventricle has low normal systolic function, with an ejection fraction of 50-55%. The cavity size was normal. There is mildly increased left ventricular wall thickness. There is mild concentric left ventricular hypertrophy.   Right Ventricle: The right ventricle has normal systolic function. The cavity was normal. There is no increase in right ventricular wall thickness.  Left [facility]: Left atrial size was dilated. No left atrial/left atrial appendage thrombus was detected.  Right [facility]: Right atrial size was dilated.  Interatrial Septum: No atrial level shunt detected by color flow Doppler.  Pericardium: There is no evidence of pericardial effusion.  Mitral Valve: The mitral valve is dilated. Mitral valve regurgitation is severe by color flow Doppler. The MR jet is centrally-directed. There is No evidence of mitral stenosis. Mitral valve annulus is dilated with an anterior-posterior dimension of 4.12cm. Diameter in the commissural view is 3.76cm. Regurgitant jet is severe with a dense signal on CWD. No flow reversal is noted in pulmonary veins.  Tricuspid Valve: The tricuspid valve was normal in structure. Tricuspid valve regurgitation is moderate by color flow Doppler. The jet is directed centrally. No evidence of tricuspid stenosis is present.  Aortic Valve: The aortic valve is tricuspid Aortic valve regurgitation was not visualized by color flow Doppler. There is no stenosis of the aortic valve.   Pulmonic Valve: The pulmonic valve was normal in structure. Pulmonic valve regurgitation is not visualized by color flow  Doppler.   Aorta: The aortic root, ascending aorta and aortic arch are normal in size and structure.  +-------------+-----------++ +---------------+-----------++ MR Peak grad:79.2 mmHg   TRICUSPID VALVE            +-------------+-----------++ +---------------+-----------++ MR Mean grad:49.0 mmHg   TR Peak grad:  32.5 mmHg   +-------------+-----------++ +---------------+-----------++ MR Vmax:     445.00 cm/s TR Vmax:       285.00 cm/s +-------------+-----------++ +---------------+-----------++ MR Vmean:    324.0 cm/s  +-------------+-----------++   Albert Eaton Electronically signed by Albert Eaton Signature Date/Time: 08/22/2022/11:46:22 AM    Final  MONITORS  LONG TERM MONITOR (3-14 DAYS) 06/05/2023  Narrative Patch Wear Time:  13 days and 23 hours (2024-07-14T22:15:08-399 to 2024-07-28T22:15:04-399)  Patient had a min HR of 53 bpm, max HR of 193 bpm, and avg HR of 81 bpm. Predominant underlying rhythm was Sinus Rhythm. 1 run of Ventricular Tachycardia occurred lasting 4 beats with a max rate of 193 bpm (avg 182 bpm). Atrial Flutter occurred (2% burden), ranging from 96-177 bpm (avg of 132 bpm), the longest lasting 33 mins 59 secs with an avg rate of 137 bpm. Atrial Flutter may be possible Atrial Tachycardia with variable block. Isolated  SVEs were occasional (3.5%, 57397), SVE Couplets were occasional (1.0%, 8279), and SVE Triplets were rare (<1.0%, 1481). Isolated VEs were occasional (2.4%, 38757), VE Couplets were rare (<1.0%, 2424), and VE Triplets were rare (<1.0%, 27). Ventricular Bigeminy and Trigeminy were present.  SUMMARY: Normal sinus with average HR of 81 bpm. Atrial flutter occurs with a 2% burden, average heart rate during atrial flutter is 137 bpm. Occasional PVC's with a burden of 2.4%.  CT SCANS  CT CORONARY FRACTIONAL FLOW RESERVE DATA PREP 10/11/2020  Narrative EXAM: FFRCT ANALYSIS  FINDINGS: FFRct analysis was  performed on the original cardiac CT angiogram dataset. Diagrammatic representation of the FFRct analysis is provided in a separate PDF document in PACS. This dictation was created using the PDF document and an interactive 3D model of the results. 3D model is not available in the EMR/PACS. Normal FFR range is >0.80.  1. Left Main:No significant stenosis. LM FFR = 0.99.  2. LAD: No significant stenosis. Proximal FFR = 0.99, Mid FFR = 0.94, Distal FFR = 0.86.  3. LCX: No significant stenosis. Proximal FFR = 1.00, Mid FFR = 1.00, Distal FFR = 1.00. PDA FFR = 0.84, LPLA FFR = 0.88.  4. OM: No significant stenosis. Proximal FFR = 1.00 and mid FFR = 0.98.  5. RCA: No significant stenosis. Proximal FFR = 0.99 and mid FFR = 0.87.  IMPRESSION: 1. Coronary CTA FFR flow analysis demonstrates no hemodynamically flow limiting lesion.  Albert Eaton   Electronically Signed By: Albert Eaton On: 10/17/2020 12:15   CT SCANS  CT CORONARY MORPH W/CTA COR W/SCORE 10/10/2020  Addendum 10/11/2020 11:39 AM ADDENDUM REPORT: 10/11/2020 11:37  EXAM: Cardiac/Coronary  CT  TECHNIQUE: The patient was scanned on a [employer].  FINDINGS: A 120 kV prospective scan was triggered in the descending thoracic aorta at 111 HU's. Axial non-contrast 3 mm slices were carried out through the heart. The data set was analyzed on a dedicated work station and scored using the Agatson method. Gantry rotation speed was 250 msecs and collimation was .6 mm. No beta blockade and 0.8 mg of sl NTG was given. The 3D data set was reconstructed in 5% intervals of the 67-82 % of the R-R cycle. Diastolic phases were analyzed on a dedicated work station using MPR, MIP and VRT modes. The patient received 80 cc of contrast.  Aorta: Mildly dilated ascending aorta at 38mm. No calcifications. No dissection.  Aortic Valve:  Trileaflet.  No calcifications.  Coronary Arteries:  Normal coronary origin.  Right  dominance.  RCA is a small non dominant artery that gives rise to PDA and PLVB. There is mild calcified plaque in the proximal RCA with associated stenosis of 25-49%. There is significant noise artifact prohibiting accurate assessment for non calcified plaque.  Left main is a large artery that gives rise to LAD and LCX arteries. There is minimal calcified plaque in the proximal and distal LM with associated stenosis of 1-24%.  LAD is a large vessel that gives rise to 3 moderate to large sized diagonals. There is mild mixed plaque in the ostial and proximal LAD with associated stenosis of 25-49% and scattered mild calcified plaque in the mid LAD with associated stenosis of 25-49%.  LCX is a dominant artery that gives rise to one large OM1 branch and then a moderate sized LPLA and LPDA. There is mild calcified plaque in the proximal OM1 and proximal PDA with associated stenosis of 25-49%.  Other findings:  Normal pulmonary vein  drainage into the left atrium.  Normal let atrial appendage without a thrombus.  Normal size of the pulmonary artery.  IMPRESSION: 1. Coronary calcium  score of 437. This was 98th percentile for age and sex matched control.  2.  Normal coronary origin with left dominance.  3.  Mild atherosclerosis.  CAD RADS 2.  4. Recommend preventive pharmacotherapy and risk factor modification.  5.  This study has been sent for FFR flow analysis.  Albert Eaton   Electronically Signed By: Albert Eaton On: 10/11/2020 11:37  Narrative EXAM: OVER-READ INTERPRETATION  CT CHEST  The following report is an over-read performed by radiologist Dr. Newell Eke of [facility] on 10/10/2020. This over-read does not include interpretation of cardiac or coronary anatomy or pathology. The coronary calcium  score/coronary CTA interpretation by the cardiologist is attached.  COMPARISON:  08/30/2020.  FINDINGS: Vascular: Pulmonic trunk and heart are  enlarged. No pericardial effusion.  Mediastinum/Nodes: No pathologically enlarged lymph nodes. Distal esophagus is unremarkable.  Lungs/Pleura: Minimal dependent atelectasis bilaterally. Lungs are otherwise clear.  Upper Abdomen: Visualized portions of the liver, spleen and stomach are grossly unremarkable.  Musculoskeletal: None.  IMPRESSION: 1. No acute extracardiac findings. 2. Enlarged pulmonic trunk, indicative of pulmonary arterial hypertension.  Electronically Signed: By: Newell Eke M.D. On: 10/10/2020 09:42          EKG:        Recent Labs: 08/07/2023: ALT 16 10/27/2023: BUN 34; Creatinine, Ser 10.68; Hemoglobin 10.7; Platelets 231; Potassium 3.8; Sodium 135  Recent Lipid Panel    Component Value Date/Time   CHOL 188 08/07/2023 1259   TRIG 169 (H) 08/07/2023 1259   HDL 36 (L) 08/07/2023 1259   CHOLHDL 5.2 (H) 08/07/2023 1259   CHOLHDL 3.5 12/02/2022 0929   VLDL 64 (H) 09/04/2021 0347   LDLCALC 122 (H) 08/07/2023 1259   LDLCALC 77 12/02/2022 0929   LDLDIRECT 23 08/22/2022 0901     Risk Assessment/Calculations:    CHA2DS2-VASc Score = 5   This indicates a 7.2% annual risk of stroke. The patient's score is based upon: CHF History: 1 HTN History: 1 Diabetes History: 0 Stroke History: 2 Vascular Disease History: 1 Age Score: 0 Gender Score: 0               Physical Exam:    VS:  BP 136/78   Pulse 73   Ht 5' 10 (1.778 m)   Wt 206 lb 12.8 oz (93.8 kg)   SpO2 97%   BMI 29.67 kg/m     Wt Readings from Last 3 Encounters:  10/29/23 206 lb 12.8 oz (93.8 kg)  10/27/23 195 lb 8.8 oz (88.7 kg)  09/02/23 200 lb (90.7 kg)     GEN:  Well nourished, well developed in no acute distress HEENT: Normal NECK: No JVD; No carotid bruits LYMPHATICS: No lymphadenopathy CARDIAC: RRR, no murmurs, rubs, gallops RESPIRATORY:  Clear to auscultation without rales, wheezing or rhonchi  ABDOMEN: Soft, non-tender, non-distended MUSCULOSKELETAL:  No  edema; No deformity  SKIN: Warm and dry NEUROLOGIC:  Alert and oriented x 3 PSYCHIATRIC:  Normal affect   Assessment & Plan Chronic diastolic CHF (congestive heart failure) (HCC) Volume managed with hemodialysis.  Clinically stable on his current medical program.  No changes are made today.  No signs of volume excess on exam. Coronary artery disease involving native coronary artery of native heart without angina pectoris Patient noted to have moderate nonobstructive coronary artery disease managed medically with an absence of angina.  Patient without high-grade obstructive disease on most recent cardiac catheterization.  He is managed with a statin drug.  He is not on aspirin  in the setting of chronic oral anticoagulation. S/P MVR (mitral valve repair) Most recent echo reviewed with only trivial residual MR. ESRD (end stage renal disease) (HCC) Awaiting renal transplantation Atrial flutter, unspecified type [facility]) Outpatient monitor reviewed and demonstrated normal sinus rhythm with a 2% burden of atrial flutter.  Patient is anticoagulated with apixaban .  Will switch him to warfarin as requested.  Will refer him to our anticoagulation clinic to make the transition.  I discussed some basic concepts and contrasted warfarin versus direct oral anticoagulant drugs.  He understands that he will need INR monitoring and will need to watch for both food and drug interactions.  He will receive further education from the anticoagulation clinic.            Medication Adjustments/Labs and Tests Ordered: Current medicines are reviewed at length with the patient today.  Concerns regarding medicines are outlined above.  Orders Placed This Encounter  Procedures   Ambulatory referral to Anticoagulation Monitoring   Meds ordered this encounter  Medications   warfarin (COUMADIN ) 5 MG tablet    Sig: Take 1 tablet (5 mg total) by mouth daily.    Dispense:  30 tablet    Refill:  0    Replacing Eliquis      Patient Instructions  Medication Instructions:  Changing Eliquis  to Coumadin /Warfarin Take Warfarin 5mg  daily starting today, continue both medications for 3 days, then discontinue Eliquis  and continue only Warfarin.  **F/u in coumadin  clinic Tuesday 11/03/23**  Testing/Procedures: Ambulatory Referral to Anticoagulant Clinic  Follow-Up: At [facility], you and your health needs are our priority.  As part of our continuing mission to provide you with exceptional heart care, we have created designated Provider Care Teams.  These Care Teams include your primary Cardiologist (physician) and Advanced Practice Providers (APPs -  Physician Assistants and Nurse Practitioners) who all work together to provide you with the care you need, when you need it.   Your next appointment:   1 year(s)  Provider:   [name], Eaton        Signed, [name], Eaton  10/29/2023 1:20 PM    [facility]  Addendum 11/16/2023: Received request for preoperative clearance for implantation of a sleep device.  As outlined above, he was having no symptoms of angina and no active heart failure symptoms.  Volume management via hemodialysis noted.  I think he can proceed with surgery without further cardiovascular testing at low risk of cardiovascular complications.  He can clearly achieve greater than 4 metabolic equivalents without associated exertional symptoms.  He will hold warfarin before surgery per protocol.  Albert Eaton 11/20/2023 3:01 PM

## 2023-10-29 NOTE — Telephone Encounter (Signed)
 Copy of this note and last ov note with Dr Wynona Neat faxed to [facility] [phone]

## 2023-11-03 ENCOUNTER — Other Ambulatory Visit: Payer: Self-pay

## 2023-11-03 ENCOUNTER — Ambulatory Visit: Payer: 59

## 2023-11-03 DIAGNOSIS — I480 Paroxysmal atrial fibrillation: Secondary | ICD-10-CM

## 2023-11-04 ENCOUNTER — Other Ambulatory Visit: Payer: Self-pay | Admitting: Cardiovascular Disease

## 2023-11-04 ENCOUNTER — Other Ambulatory Visit: Payer: Self-pay | Admitting: Allergy & Immunology

## 2023-11-05 ENCOUNTER — Ambulatory Visit: Payer: 59 | Attending: Cardiovascular Disease

## 2023-11-05 DIAGNOSIS — Z7901 Long term (current) use of anticoagulants: Secondary | ICD-10-CM

## 2023-11-05 DIAGNOSIS — Z9889 Other specified postprocedural states: Secondary | ICD-10-CM | POA: Diagnosis not present

## 2023-11-05 DIAGNOSIS — I4892 Unspecified atrial flutter: Secondary | ICD-10-CM

## 2023-11-05 LAB — POCT INR: INR: 1.4 — AB (ref 2.0–3.0)

## 2023-11-05 NOTE — Patient Instructions (Addendum)
 Description   Take 1.5 tablets today and then START taking 1 tablet daily EXCEPT 1.5 tablets on Sundays.  Stay consistent with greens each week (1 per week)  Coumadin  Clinic [phone]    A full discussion of the nature of anticoagulants has been carried out.  A benefit risk analysis has been presented to the patient, so that they understand the justification for choosing anticoagulation at this time. The need for frequent and regular monitoring, precise dosage adjustment and compliance is stressed.  Side effects of potential bleeding are discussed.  The patient should avoid any OTC items containing aspirin  or ibuprofen, and should avoid great swings in general diet.  Avoid alcohol consumption.

## 2023-11-10 ENCOUNTER — Other Ambulatory Visit: Payer: Self-pay | Admitting: Allergy & Immunology

## 2023-11-12 ENCOUNTER — Ambulatory Visit: Payer: 59 | Attending: Internal Medicine

## 2023-11-12 DIAGNOSIS — I480 Paroxysmal atrial fibrillation: Secondary | ICD-10-CM

## 2023-11-12 LAB — POCT INR: INR: 1.2 — AB (ref 2.0–3.0)

## 2023-11-12 NOTE — Patient Instructions (Signed)
Description   Take 2 tablets today and 1.5 tablets tomorrow and then START taking 1 tablet daily EXCEPT 2 tablets on Sundays and Tuesdays.  Recheck INR on Tuesday.  Stay consistent with greens each week (1 per week)  Coumadin Clinic [phone]

## 2023-11-17 ENCOUNTER — Other Ambulatory Visit: Payer: Self-pay

## 2023-11-17 ENCOUNTER — Encounter: Payer: Self-pay | Admitting: Allergy & Immunology

## 2023-11-17 ENCOUNTER — Ambulatory Visit: Payer: 59 | Attending: Cardiovascular Disease | Admitting: *Deleted

## 2023-11-17 ENCOUNTER — Ambulatory Visit (INDEPENDENT_AMBULATORY_CARE_PROVIDER_SITE_OTHER): Payer: 59 | Admitting: Allergy & Immunology

## 2023-11-17 VITALS — BP 138/84 | HR 78 | Temp 98.2°F | Resp 16

## 2023-11-17 DIAGNOSIS — J3089 Other allergic rhinitis: Secondary | ICD-10-CM

## 2023-11-17 DIAGNOSIS — Z9889 Other specified postprocedural states: Secondary | ICD-10-CM

## 2023-11-17 DIAGNOSIS — J4530 Mild persistent asthma, uncomplicated: Secondary | ICD-10-CM | POA: Diagnosis not present

## 2023-11-17 DIAGNOSIS — Z7901 Long term (current) use of anticoagulants: Secondary | ICD-10-CM

## 2023-11-17 DIAGNOSIS — B20 Human immunodeficiency virus [HIV] disease: Secondary | ICD-10-CM

## 2023-11-17 DIAGNOSIS — I4892 Unspecified atrial flutter: Secondary | ICD-10-CM

## 2023-11-17 DIAGNOSIS — I480 Paroxysmal atrial fibrillation: Secondary | ICD-10-CM | POA: Diagnosis not present

## 2023-11-17 LAB — POCT INR: INR: 1.6 — AB (ref 2.0–3.0)

## 2023-11-17 NOTE — Progress Notes (Signed)
FOLLOW UP  Date of Service/Encounter:  11/17/23   Assessment:   Mild persistent asthma, uncomplicated - with better control with daily ICS use  OSA - receiving Inspire device at the end of January 2025   Perennial allergic rhinitis (dust mites)   HIV - on HAART   ESRD - on transplant list x 6 years   Dysgeusia - unknown cause (cleared by ENT)   Ear fullness - with normal exam today    Plan/Recommendations:   1. Mild persistent asthma, uncomplicated - Lung testing looks stable today.  - Daily controller medication(s): Symbicort 160/4.30mcg two puffs one to two times with spacer - Prior to physical activity: albuterol 2 puffs 10-15 minutes before physical activity. - Rescue medications: albuterol 4 puffs every 4-6 hours as needed - Changes during respiratory infections or worsening symptoms: INCREASE Symbicort to two puffs TWICE DAILY for ONE TO TWO WEEKS. - Asthma control goals:  * Full participation in all desired activities (may need albuterol before activity) * Albuterol use two time or less a week on average (not counting use with activity) * Cough interfering with sleep two time or less a month * Oral steroids no more than once a year * No hospitalizations  2. Obstructive sleep apnea - Congrats on getting the Inspire device placed. - I hope that works well!   3. Chronic allergy rhinoconjunctivitis - Continue with Pataday one drop per eye twice daily. - Continue with Systane eye drops as needed. - Continue with azelastine AS NEEDED.  - Continue with ipratropium AS NEEDED.  - Continue with Zyrtec (cetirizine) daily .  4. Return in about 1 year (around 11/16/2024). You can have the follow up appointment with Dr. Dellis Anes or a Nurse Practicioner (our Nurse Practitioners are excellent and always have Physician oversight!).    Subjective:   Albert Eaton is a 58 y.o. male presenting today for follow up of  Chief Complaint  Patient presents with   Asthma     Albert Eaton has a history of the following: Patient Active Problem List   Diagnosis Date Noted   Long term (current) use of anticoagulants 11/05/2023   ESRD (end stage renal disease) (HCC) 10/29/2023   Atrial flutter, unspecified type (HCC) 10/29/2023   Precordial pain 02/17/2023   Heart failure with mildly reduced ejection fraction (HFmrEF) (HCC) 02/17/2023   Hypotension 02/17/2023   Ascending aorta dilation (HCC) 02/17/2023   CAD (coronary artery disease) 02/16/2023   Chronic viral hepatitis B without delta-agent (HCC) 01/06/2023   Cancer screening 01/06/2023   Vitamin D deficiency 01/01/2023   Acute cough 11/25/2022   Influenza A 11/25/2022   Other headache syndrome 11/25/2022   Multiple fractures of ribs, bilateral, initial encounter for closed fracture 09/16/2022   Cardiac arrest (HCC) 09/08/2022   Acute cerebrovascular insufficiency 08/28/2022   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 08/28/2022   Cerebrovascular accident (CVA) due to embolism of left cerebellar artery (HCC)    S/P MVR (mitral valve repair) 08/21/2022   Chronic pain of right ankle 06/26/2022   Autonomic neuropathy in diseases classified elsewhere 04/21/2022   PAF (paroxysmal atrial fibrillation) (HCC) 12/02/2021   Laryngopharyngeal reflux (LPR) 11/11/2021   Chest wall pain    Elevated troponin    NSTEMI (non-ST elevated myocardial infarction) (HCC) 09/03/2021   Essential hypertension 09/03/2021   Renal cell carcinoma (HCC) 06/27/2021   Generalized (acute) peritonitis (HCC) 06/24/2021   Other disorders of bilirubin metabolism 06/24/2021   Renal mass 02/18/2021  Pain, unspecified 02/08/2021   SVT (supraventricular tachycardia) (HCC) 01/18/2021   Lower abdominal pain 06/26/2020   Contact with and (suspected) exposure to tuberculosis 04/02/2020   Fluid overload, unspecified 04/02/2020   Pulsatile tinnitus of right ear 11/21/2019   Kidney failure 11/21/2019   Acute diastolic  CHF (congestive heart failure) (HCC) 11/21/2019   Fever and chills 11/07/2019   Hypercalcemia 10/17/2019   Dependence on renal dialysis (HCC) 06/06/2019   Coagulation defect, unspecified (HCC) 05/23/2019   Iron deficiency anemia, unspecified 05/16/2019   Anemia in other chronic diseases classified elsewhere 05/13/2019   Arteriovenous fistula, acquired (HCC) 05/13/2019   Body mass index (BMI) 28.0-28.9, adult 05/13/2019   Crohn's disease of small intestine without complications (HCC) 05/13/2019   Encounter for general adult medical examination with abnormal findings 05/13/2019   Gout, unspecified 05/13/2019   Nausea 05/13/2019   Other fatigue 05/13/2019   Secondary hyperparathyroidism of renal origin (HCC) 05/13/2019   ESRD (end stage renal disease) on dialysis (HCC) 05/13/2019   Chronic diastolic (congestive) heart failure (HCC) 05/13/2019   Chronic kidney disease, unspecified 05/13/2019   Obstructive sleep apnea 05/13/2019   Discomfort of right ear 02/18/2019   Hypokalemia 06/13/2018   Abnormal CT scan, small bowel    Perennial allergic rhinitis with a predominant nonallergic component 02/09/2018   Allergic conjunctivitis 02/09/2018   Mild intermittent asthma 02/09/2018   Atherosclerosis 12/09/2017   Hypothyroidism 11/19/2017   Mixed hyperlipidemia 10/13/2017   Personal history of gout 10/13/2017   S/P cholecystectomy 10/13/2017   Congestive heart failure with LV diastolic dysfunction, NYHA class 1 (HCC) 10/13/2017   Hypothyroidism (acquired) 10/13/2017   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/31/2017   SOB (shortness of breath) 06/07/2017   Arthritis, gouty 01/27/2017   Chronic diastolic CHF (congestive heart failure) (HCC) 01/06/2017   OSA (obstructive sleep apnea) 01/06/2017   Abnormal electrocardiogram (ECG) (EKG) 10/31/2016   Pleuritic chest pain 10/09/2016   HIV disease (HCC) 10/09/2016   Hypertensive heart disease with CHF (congestive heart failure) (HCC) 10/09/2016   Lumbar  back pain 05/29/2015   NYHA class 2 heart failure with preserved ejection fraction (HCC) 05/15/2015   PAH (pulmonary artery hypertension) (HCC) 05/08/2015   Severe mitral regurgitation 05/04/2015   Prolonged Q-T interval on ECG 05/04/2015    History obtained from: chart review and patient.  Discussed the use of AI scribe software for clinical note transcription with the patient and/or guardian, who gave verbal consent to proceed.  Method is a 58 y.o. male presenting for a follow up visit. He was last seen in July 2024. At that time, lung testing looked stable.  We continue with Symbicort 160 mcg 2 puffs 1-2 times daily as well as albuterol as needed.  For his environmental allergies, we continue with Pataday as well as Systane, Astelin, ipratropium, and Zyrtec.  Since last visit, she has done well.   Yazir has a complicated past medical history, including kidney failure (on dialysis).   Asthma/Respiratory Symptom History: He is not currently using his prescribed CPAP machine due to feelings of claustrophobia. Instead, he is scheduled to undergo surgery for the implantation of an Inspire device to manage his sleep apnea. He reports no recent changes in breathing.   Allergic Rhinitis Symptom History: His environmental allergies are reportedly well-controlled with as-needed use of nasal sprays. He reports that he experienced a sinus issue a few weeks ago, which started as a cold and then progressed to nasal congestion. The patient did not seek treatment and the symptoms  resolved on his own. He did have a previous issue with altered taste, which has since resolved.   He continues to follow with Dr. Ninetta Lights. His last appointment was in June 2023.  He was doing well at that time and endorsed compliance with his medication regimen.  He remained on Biktarvy.  His last HIV quantitative PCR was undetectable.  His last CD4 count was 694.  He was recently reactivated on the transplant list after a  period of ineligibility due to a cardiac event last year. The cardiac event, which occurred during dialysis, was attributed to an electrolyte imbalance. The patient experienced a cardiac arrest and lost consciousness for two minutes. Subsequent cardiac workup, including an echocardiogram and heart catheterization, was normal. He has been on the kidney transplant list for six years.   He remains on dialysis 3 times a week.  He continues to follow with Dr. Peri Jefferson.  He has a history of renal cell carcinoma that was removed in April 2022.  Kidney biopsies have shown nephro sclerosis and interstitial fibrosis. The average wait time is 4 to 7 years, but he tells me that his blood time typically has to wait even longer. He was recently placed back on the transplant list after his cardiac arrest.   Otherwise, there have been no changes to his past medical history, surgical history, family history, or social history.    Review of systems otherwise negative other than that mentioned in the HPI.    Objective:   Blood pressure 138/84, pulse 78, temperature 98.2 F (36.8 C), temperature source Temporal, resp. rate 16, SpO2 96%. There is no height or weight on file to calculate BMI.    Physical Exam Vitals reviewed.  Constitutional:      Appearance: He is well-developed.     Comments: Very pleasant.  Lovely and smiling.  HENT:     Head: Normocephalic and atraumatic.     Right Ear: Tympanic membrane, ear canal and external ear normal.     Left Ear: Tympanic membrane, ear canal and external ear normal.     Nose: No nasal deformity, septal deviation, mucosal edema or rhinorrhea.     Right Turbinates: Enlarged, swollen and pale.     Left Turbinates: Enlarged, swollen and pale.     Right Sinus: No maxillary sinus tenderness or frontal sinus tenderness.     Left Sinus: No maxillary sinus tenderness or frontal sinus tenderness.     Comments: No nasal polyps.    Mouth/Throat:     Lips: Pink.      Mouth: Mucous membranes are moist. Mucous membranes are not pale and not dry.     Pharynx: Uvula midline.     Comments: Moderate cobblestoning. Eyes:     General: Lids are normal. Allergic shiner present.        Right eye: No discharge.        Left eye: No discharge.     Conjunctiva/sclera: Conjunctivae normal.     Right eye: Right conjunctiva is not injected. No chemosis.    Left eye: Left conjunctiva is not injected. No chemosis.    Pupils: Pupils are equal, round, and reactive to light.  Cardiovascular:     Rate and Rhythm: Normal rate and regular rhythm.     Heart sounds: Normal heart sounds.  Pulmonary:     Effort: Pulmonary effort is normal. No tachypnea, accessory muscle usage or respiratory distress.     Breath sounds: No decreased breath sounds, wheezing, rhonchi or rales.  Comments: Moving air well in all lung fields.  No increased work of breathing. Chest:     Chest wall: No tenderness.     Comments: Chest pain to palpation. Lymphadenopathy:     Cervical: No cervical adenopathy.  Skin:    General: Skin is warm.     Capillary Refill: Capillary refill takes less than 2 seconds.     Coloration: Skin is not pale.     Findings: No abrasion, erythema, petechiae or rash. Rash is not papular, urticarial or vesicular.     Comments: Fistula in the left upper arm.  Neurological:     Mental Status: He is alert.  Psychiatric:        Behavior: Behavior is cooperative.      Diagnostic studies:    Spirometry: results normal (FEV1: 2.69/86%, FVC: 3.69/92%, FEV1/FVC: 73%).    Spirometry consistent with normal pattern.   Allergy Studies: none      Malachi Bonds, MD  [facility]

## 2023-11-17 NOTE — Patient Instructions (Signed)
Description   Take 2.5 tablets today then START taking 1 tablet daily EXCEPT 2 tablets on Sundays, Tuesdays, and Thursdays.  Recheck INR 1 week.  Stay consistent with greens each week (1 per week)  Coumadin Clinic [phone] Clearance Pre-Op Form Fax to [phone] or [phone]

## 2023-11-17 NOTE — Patient Instructions (Addendum)
1. Mild persistent asthma, uncomplicated - Lung testing looks stable today.  - Daily controller medication(s): Symbicort 160/4.2mcg two puffs one to two times with spacer - Prior to physical activity: albuterol 2 puffs 10-15 minutes before physical activity. - Rescue medications: albuterol 4 puffs every 4-6 hours as needed - Changes during respiratory infections or worsening symptoms: INCREASE Symbicort to two puffs TWICE DAILY for ONE TO TWO WEEKS. - Asthma control goals:  * Full participation in all desired activities (may need albuterol before activity) * Albuterol use two time or less a week on average (not counting use with activity) * Cough interfering with sleep two time or less a month * Oral steroids no more than once a year * No hospitalizations  2. Obstructive sleep apnea - Congrats on getting the Inspire device placed. - I hope that works well!   3. Chronic allergy rhinoconjunctivitis - Continue with Pataday one drop per eye twice daily. - Continue with Systane eye drops as needed. - Continue with azelastine AS NEEDED.  - Continue with ipratropium AS NEEDED.  - Continue with Zyrtec (cetirizine) daily .  4. Return in about 1 year (around 11/16/2024). You can have the follow up appointment with Dr. Dellis Anes or a Nurse Practicioner (our Nurse Practitioners are excellent and always have Physician oversight!).    Please inform us of any Emergency Department visits, hospitalizations, or changes in symptoms. Call us before going to the ED for breathing or allergy symptoms since we might be able to fit you in for a sick visit. Feel free to contact us anytime with any questions, problems, or concerns.  It was a pleasure to see you again today!  Websites that have reliable patient information: 1. American Academy of Asthma, Allergy, and Immunology: www.aaaai.org 2. Food Allergy Research and Education (FARE): foodallergy.org 3. Mothers of Asthmatics:  http://www.asthmacommunitynetwork.org 4. American College of Allergy, Asthma, and Immunology: www.acaai.org      "Like" [place] on Facebook and Instagram for our latest updates!      A healthy democracy works best when [employer] participate! Make sure you are registered to vote! If you have moved or changed any of your contact information, you will need to get this updated before voting! Scan the QR codes below to learn more!

## 2023-11-18 ENCOUNTER — Other Ambulatory Visit: Payer: Self-pay | Admitting: Allergy & Immunology

## 2023-11-20 ENCOUNTER — Telehealth: Payer: Self-pay | Admitting: Cardiovascular Disease

## 2023-11-20 NOTE — Telephone Encounter (Signed)
Marcelino Duster from [facility] is requesting a callback to f/u on the clearance sent back in December for pt who has procedure on 1/30. They'd like a call today because they don't want to r/s pt's procedure so a voicemail can be left as well. Please advise

## 2023-11-23 ENCOUNTER — Encounter: Payer: Self-pay | Admitting: Internal Medicine

## 2023-11-23 NOTE — Telephone Encounter (Signed)
I will have pre op APP review if pt has been cleared.

## 2023-11-23 NOTE — Telephone Encounter (Signed)
Notes faxed to surgeon. This phone note will be removed from the preop pool. [name], PA-C  11/23/2023 12:24 PM

## 2023-11-23 NOTE — Telephone Encounter (Signed)
   Patient Name: Albert Eaton  DOB: Aug 17, 1966 MRN: [id number]  Primary Cardiologist: Tonny Bollman, MD  Chart reviewed as part of pre-operative protocol coverage. Given past medical history and time since last visit, based on ACC/AHA guidelines, Albert Eaton is at acceptable risk for the planned procedure without further cardiovascular testing. Please seen recent office note from Dr. Excell Seltzer already faxed to surgeon's office.   He can hold his Coumadin for 5 days prior to his procedure and resume post op as soon as possible. He does NOT need Lovenox bridging.  Please call with questions.  [name], PA-C 11/23/2023, 12:18 PM

## 2023-11-23 NOTE — Telephone Encounter (Signed)
I will review with Dr. Excell Seltzer who saw him. Notes indicate he was switched to Coumadin. Please confirm with ENT if anticoagulation needs to be held. [name], PA-C    11/23/2023 10:19 AM

## 2023-11-23 NOTE — Telephone Encounter (Signed)
I s/w Marcelino Duster, surgery scheduler for Dr. Jenne Pane. I assured Marcelino Duster I have d/w our pre op APP [name], [facility] today about pt being cleared. I asked Marcelino Duster if Coumadin needs to be held per we did not see it noted. I apologized if we missed the Coumadin to be held. Marcelino Duster answered yes Coumadin will be held 5-7 days or what the cardiologist and pharm-d recommend.   I will send this back to the pre op APP to see update notes.

## 2023-11-23 NOTE — Telephone Encounter (Signed)
Note faxed to surgeon. Will remove from preop pool. [name], PA-C    11/23/2023 10:26 AM

## 2023-11-23 NOTE — Pre-Procedure Instructions (Signed)
Surgical Instructions   Your procedure is scheduled on November 26, 2023. Report to [facility] Main Entrance "A" at 7:50 A.M., then check in with the Admitting office. Any questions or running late day of surgery: call [phone]  Questions prior to your surgery date: call [phone], Monday-Friday, 8am-4pm. If you experience any cold or flu symptoms such as cough, fever, chills, shortness of breath, etc. between now and your scheduled surgery, please notify us at the above number.     Remember:  Do not eat after midnight the night before your surgery   You may drink clear liquids until 6:50 AM the morning of your surgery.   Clear liquids allowed are: Water, Non-Citrus Juices (without pulp), Carbonated Beverages, Clear Tea (no milk, honey, etc.), Black Coffee Only (NO MILK, CREAM OR POWDERED CREAMER of any kind), and Gatorade.    Take these medicines the morning of surgery with A SIP OF WATER: allopurinol (ZYLOPRIM)  amLODipine (NORVASC)  bictegravir-emtricitabine-tenofovir AF (BIKTARVY)  cetirizine (ZYRTEC)  ezetimibe (ZETIA)  famotidine (PEPCID)  levothyroxine (SYNTHROID)  metoprolol tartrate (LOPRESSOR)  pantoprazole (PROTONIX)  rosuvastatin (CRESTOR)  SENSIPAR  sevelamer carbonate (RENVELA)  SYMBICORT inhaler    May take these medicines IF NEEDED: acetaminophen (ACETAMINOPHEN 8 HOUR)  azelastine (ASTELIN) nasal spray  fluticasone (FLOVENT HFA) inhaler  gabapentin (NEURONTIN)  ipratropium (ATROVENT) nasal spray  lactulose (CHRONULAC)  levalbuterol (XOPENEX HFA) inhaler  Olopatadine HCl (PATADAY) eye drops tiZANidine (ZANAFLEX)    Follow your surgeon's instructions on when to stop apixaban (ELIQUIS)/warfarin (COUMADIN).  If no instructions were given by your surgeon then you will need to call the office to get those instructions.     One week prior to surgery, STOP taking any Aspirin (unless otherwise instructed by your surgeon) Aleve, Naproxen, Ibuprofen,  Motrin, Advil, Goody's, BC's, all herbal medications, fish oil, and non-prescription vitamins. This includes your medication: diclofenac Sodium (VOLTAREN) GEL                      Do NOT Smoke (Tobacco/Vaping) for 24 hours prior to your procedure.  If you use a CPAP at night, you may bring your mask/headgear for your overnight stay.   You will be asked to remove any contacts, glasses, piercing's, hearing aid's, dentures/partials prior to surgery. Please bring cases for these items if needed.    Patients discharged the day of surgery will not be allowed to drive home, and someone needs to stay with them for 24 hours.  SURGICAL WAITING ROOM VISITATION Patients may have no more than 2 support people in the waiting area - these visitors may rotate.   Pre-op nurse will coordinate an appropriate time for 1 ADULT support person, who may not rotate, to accompany patient in pre-op.  Children under the age of 18 must have an adult with them who is not the patient and must remain in the main waiting area with an adult.  If the patient needs to stay at the hospital during part of their recovery, the visitor guidelines for inpatient rooms apply.  Please refer to the [facility] website for the visitor guidelines for any additional information.   If you received a COVID test during your pre-op visit  it is requested that you wear a mask when out in public, stay away from anyone that may not be feeling well and notify your surgeon if you develop symptoms. If you have been in contact with anyone that has tested positive in the last 10 days please notify  you surgeon.      Pre-operative CHG Bathing Instructions   You can play a key role in reducing the risk of infection after surgery. Your skin needs to be as free of germs as possible. You can reduce the number of germs on your skin by washing with CHG (chlorhexidine gluconate) soap before surgery. CHG is an antiseptic soap that kills germs and continues  to kill germs even after washing.   DO NOT use if you have an allergy to chlorhexidine/CHG or antibacterial soaps. If your skin becomes reddened or irritated, stop using the CHG and notify one of our RNs at [phone].              TAKE A SHOWER THE NIGHT BEFORE SURGERY AND THE DAY OF SURGERY    Please keep in mind the following:  DO NOT shave, including legs and underarms, 48 hours prior to surgery.   You may shave your face before/day of surgery.  Place clean sheets on your bed the night before surgery Use a clean washcloth (not used since being washed) for each shower. DO NOT sleep with pet's night before surgery.  CHG Shower Instructions:  Wash your face and private area with normal soap. If you choose to wash your hair, wash first with your normal shampoo.  After you use shampoo/soap, rinse your hair and body thoroughly to remove shampoo/soap residue.  Turn the water OFF and apply half the bottle of CHG soap to a CLEAN washcloth.  Apply CHG soap ONLY FROM YOUR NECK DOWN TO YOUR TOES (washing for 3-5 minutes)  DO NOT use CHG soap on face, private areas, open wounds, or sores.  Pay special attention to the area where your surgery is being performed.  If you are having back surgery, having someone wash your back for you may be helpful. Wait 2 minutes after CHG soap is applied, then you may rinse off the CHG soap.  Pat dry with a clean towel  Put on clean pajamas    Additional instructions for the day of surgery: DO NOT APPLY any lotions, deodorants, cologne, or perfumes.   Do not wear jewelry or makeup Do not wear nail polish, gel polish, artificial nails, or any other type of covering on natural nails (fingers and toes) Do not bring valuables to the hospital. [facility] is not responsible for valuables/personal belongings. Put on clean/comfortable clothes.  Please brush your teeth.  Ask your nurse before applying any prescription medications to the skin.

## 2023-11-23 NOTE — Telephone Encounter (Signed)
Albert Eaton Duster from [facility] is calling to get update on clearance. She states they will have to cancel schedule if it isn't addressed today. Please advise.

## 2023-11-24 ENCOUNTER — Other Ambulatory Visit: Payer: Self-pay

## 2023-11-24 ENCOUNTER — Encounter (HOSPITAL_COMMUNITY)
Admission: RE | Admit: 2023-11-24 | Discharge: 2023-11-24 | Disposition: A | Discharge status: Home / Self Care | Payer: 59 | Source: Ambulatory Visit | Attending: Otolaryngology | Admitting: Otolaryngology

## 2023-11-24 ENCOUNTER — Ambulatory Visit: Payer: 59 | Attending: Cardiovascular Disease

## 2023-11-24 VITALS — BP 171/108 | HR 86 | Temp 98.5°F | Resp 18 | Ht 70.0 in | Wt 200.0 lb

## 2023-11-24 DIAGNOSIS — I480 Paroxysmal atrial fibrillation: Secondary | ICD-10-CM | POA: Diagnosis not present

## 2023-11-24 DIAGNOSIS — Z7901 Long term (current) use of anticoagulants: Secondary | ICD-10-CM | POA: Diagnosis not present

## 2023-11-24 DIAGNOSIS — Z01818 Encounter for other preprocedural examination: Secondary | ICD-10-CM

## 2023-11-24 DIAGNOSIS — Z9889 Other specified postprocedural states: Secondary | ICD-10-CM

## 2023-11-24 DIAGNOSIS — I4892 Unspecified atrial flutter: Secondary | ICD-10-CM

## 2023-11-24 LAB — POCT INR: INR: 1.7 — AB (ref 2.0–3.0)

## 2023-11-24 MED ORDER — WARFARIN SODIUM 5 MG PO TABS: ORAL_TABLET | ORAL | 1 refills | Status: DC

## 2023-11-24 NOTE — Progress Notes (Signed)
Patient came in for a PAT appointment however his appointment had been rescheduled from 11-26-23 to 12-30-23.  Patient PAT appointment has been rescheduled for 11-29-23.

## 2023-11-24 NOTE — Patient Instructions (Signed)
Take 2.5 tablets today only then START taking 2 tablets Daily except 1 tablet every Monday, Wednesday and Friday.  Recheck INR 2 weeks.    [facility] [phone] Clearance Pre-Op Form Fax to [phone] or [phone]

## 2023-11-26 ENCOUNTER — Telehealth: Payer: Self-pay | Admitting: Pulmonary Disease

## 2023-11-26 NOTE — Telephone Encounter (Signed)
Patient needs refill of ropinirole to be sent to [facility].

## 2023-11-30 DIAGNOSIS — Z992 Dependence on renal dialysis: Secondary | ICD-10-CM | POA: Diagnosis not present

## 2023-11-30 DIAGNOSIS — N186 End stage renal disease: Secondary | ICD-10-CM | POA: Diagnosis not present

## 2023-11-30 DIAGNOSIS — D509 Iron deficiency anemia, unspecified: Secondary | ICD-10-CM | POA: Diagnosis not present

## 2023-11-30 DIAGNOSIS — E876 Hypokalemia: Secondary | ICD-10-CM | POA: Diagnosis not present

## 2023-11-30 DIAGNOSIS — N2581 Secondary hyperparathyroidism of renal origin: Secondary | ICD-10-CM | POA: Diagnosis not present

## 2023-11-30 DIAGNOSIS — D631 Anemia in chronic kidney disease: Secondary | ICD-10-CM | POA: Diagnosis not present

## 2023-11-30 NOTE — Telephone Encounter (Signed)
Pt last seen 07/01/2022. Appt is needed prior to refills.  Lm for patient.

## 2023-12-01 ENCOUNTER — Other Ambulatory Visit: Payer: Self-pay | Admitting: Nurse Practitioner

## 2023-12-01 DIAGNOSIS — N186 End stage renal disease: Secondary | ICD-10-CM | POA: Diagnosis not present

## 2023-12-01 DIAGNOSIS — D631 Anemia in chronic kidney disease: Secondary | ICD-10-CM | POA: Diagnosis not present

## 2023-12-01 DIAGNOSIS — D509 Iron deficiency anemia, unspecified: Secondary | ICD-10-CM | POA: Diagnosis not present

## 2023-12-01 DIAGNOSIS — Z992 Dependence on renal dialysis: Secondary | ICD-10-CM | POA: Diagnosis not present

## 2023-12-01 DIAGNOSIS — N2581 Secondary hyperparathyroidism of renal origin: Secondary | ICD-10-CM | POA: Diagnosis not present

## 2023-12-01 DIAGNOSIS — E876 Hypokalemia: Secondary | ICD-10-CM | POA: Diagnosis not present

## 2023-12-01 NOTE — Telephone Encounter (Signed)
 Copied from CRM [id number]. Topic: Clinical - Medication Refill >> Dec 01, 2023  2:49 PM Franky GRADE wrote: Most Recent Primary Care Visit:  Provider: LADELL YOLANDE HERO  Department: [facility]  Visit Type: MEDICARE AWV, SEQUENTIAL  Date: 07/13/2023  Medication: Vitamin D , Ergocalciferol , (DRISDOL ) 1.25 MG (50000 UNIT) CAPS capsule  Has the patient contacted their pharmacy? Yes, [facility]ill request (Agent: If no, request that the patient contact the pharmacy for the refill. If patient does not wish to contact the pharmacy document the reason why and proceed with request.) (Agent: If yes, when and what did the pharmacy advise?)  Is this the correct pharmacy for this prescription? Yes If no, delete pharmacy and type the correct one.  This is the patient's preferred pharmacy:    [facility] - [place], [place] - [address] [place] [address] Phone: [phone] Fax: [phone]   Has the prescription been filled recently? No  Is the patient out of the medication? Unsure, the pharmacy is calling to submit request  Has the patient been seen for an appointment in the last year OR does the patient have an upcoming appointment? Yes  Can we respond through MyChart? Yes  Agent: Please be advised that Rx refills may take up to 3 business days. We ask that you follow-up with your pharmacy.

## 2023-12-01 NOTE — Telephone Encounter (Signed)
.  Last Fill: 01/06/23  Last OV: 07/16/23 Next OV: 07/18/24 AWV  Routing to provider for review/authorization.

## 2023-12-02 DIAGNOSIS — D631 Anemia in chronic kidney disease: Secondary | ICD-10-CM | POA: Diagnosis not present

## 2023-12-02 DIAGNOSIS — N2581 Secondary hyperparathyroidism of renal origin: Secondary | ICD-10-CM | POA: Diagnosis not present

## 2023-12-02 DIAGNOSIS — Z992 Dependence on renal dialysis: Secondary | ICD-10-CM | POA: Diagnosis not present

## 2023-12-02 DIAGNOSIS — E876 Hypokalemia: Secondary | ICD-10-CM | POA: Diagnosis not present

## 2023-12-02 DIAGNOSIS — D509 Iron deficiency anemia, unspecified: Secondary | ICD-10-CM | POA: Diagnosis not present

## 2023-12-02 DIAGNOSIS — N186 End stage renal disease: Secondary | ICD-10-CM | POA: Diagnosis not present

## 2023-12-03 ENCOUNTER — Other Ambulatory Visit: Payer: Self-pay | Admitting: Cardiovascular Disease

## 2023-12-03 NOTE — Telephone Encounter (Signed)
 ATC pt. Lmtcb

## 2023-12-04 DIAGNOSIS — N186 End stage renal disease: Secondary | ICD-10-CM | POA: Diagnosis not present

## 2023-12-04 DIAGNOSIS — D631 Anemia in chronic kidney disease: Secondary | ICD-10-CM | POA: Diagnosis not present

## 2023-12-04 DIAGNOSIS — N2581 Secondary hyperparathyroidism of renal origin: Secondary | ICD-10-CM | POA: Diagnosis not present

## 2023-12-04 DIAGNOSIS — Z992 Dependence on renal dialysis: Secondary | ICD-10-CM | POA: Diagnosis not present

## 2023-12-04 DIAGNOSIS — D509 Iron deficiency anemia, unspecified: Secondary | ICD-10-CM | POA: Diagnosis not present

## 2023-12-04 DIAGNOSIS — E876 Hypokalemia: Secondary | ICD-10-CM | POA: Diagnosis not present

## 2023-12-04 NOTE — Telephone Encounter (Signed)
 Mychart message sent to pt since he had attempted to be reached by phone twice.

## 2023-12-08 ENCOUNTER — Ambulatory Visit: Payer: 59 | Attending: Cardiovascular Disease

## 2023-12-08 DIAGNOSIS — I4892 Unspecified atrial flutter: Secondary | ICD-10-CM

## 2023-12-08 DIAGNOSIS — I480 Paroxysmal atrial fibrillation: Secondary | ICD-10-CM

## 2023-12-08 DIAGNOSIS — Z7901 Long term (current) use of anticoagulants: Secondary | ICD-10-CM

## 2023-12-08 DIAGNOSIS — Z9889 Other specified postprocedural states: Secondary | ICD-10-CM | POA: Diagnosis not present

## 2023-12-08 LAB — POCT INR: INR: 2 (ref 2.0–3.0)

## 2023-12-08 NOTE — Patient Instructions (Signed)
Take 2.5 tablets today only then continue taking 2 tablets Daily except 1 tablet every Monday, Wednesday and Friday.  Recheck INR 2 weeks.    [facility] [phone] Clearance Pre-Op Form Fax to [phone] or [phone]

## 2023-12-09 DIAGNOSIS — Z992 Dependence on renal dialysis: Secondary | ICD-10-CM | POA: Diagnosis not present

## 2023-12-09 DIAGNOSIS — D631 Anemia in chronic kidney disease: Secondary | ICD-10-CM | POA: Diagnosis not present

## 2023-12-09 DIAGNOSIS — N186 End stage renal disease: Secondary | ICD-10-CM | POA: Diagnosis not present

## 2023-12-09 DIAGNOSIS — E876 Hypokalemia: Secondary | ICD-10-CM | POA: Diagnosis not present

## 2023-12-09 DIAGNOSIS — D509 Iron deficiency anemia, unspecified: Secondary | ICD-10-CM | POA: Diagnosis not present

## 2023-12-09 DIAGNOSIS — N2581 Secondary hyperparathyroidism of renal origin: Secondary | ICD-10-CM | POA: Diagnosis not present

## 2023-12-11 DIAGNOSIS — D509 Iron deficiency anemia, unspecified: Secondary | ICD-10-CM | POA: Diagnosis not present

## 2023-12-11 DIAGNOSIS — N2581 Secondary hyperparathyroidism of renal origin: Secondary | ICD-10-CM | POA: Diagnosis not present

## 2023-12-11 DIAGNOSIS — D631 Anemia in chronic kidney disease: Secondary | ICD-10-CM | POA: Diagnosis not present

## 2023-12-11 DIAGNOSIS — E876 Hypokalemia: Secondary | ICD-10-CM | POA: Diagnosis not present

## 2023-12-11 DIAGNOSIS — N186 End stage renal disease: Secondary | ICD-10-CM | POA: Diagnosis not present

## 2023-12-11 DIAGNOSIS — Z992 Dependence on renal dialysis: Secondary | ICD-10-CM | POA: Diagnosis not present

## 2023-12-14 ENCOUNTER — Emergency Department (HOSPITAL_COMMUNITY): Payer: 59

## 2023-12-14 ENCOUNTER — Emergency Department (HOSPITAL_COMMUNITY)
Admission: EM | Admit: 2023-12-14 | Discharge: 2023-12-14 | Disposition: A | Discharge status: Home / Self Care | Payer: 59 | Attending: Emergency Medicine | Admitting: Emergency Medicine

## 2023-12-14 ENCOUNTER — Other Ambulatory Visit: Payer: Self-pay

## 2023-12-14 ENCOUNTER — Ambulatory Visit: Payer: Self-pay | Admitting: Nurse Practitioner

## 2023-12-14 ENCOUNTER — Encounter (HOSPITAL_COMMUNITY): Payer: Self-pay | Admitting: Emergency Medicine

## 2023-12-14 DIAGNOSIS — R918 Other nonspecific abnormal finding of lung field: Secondary | ICD-10-CM | POA: Diagnosis not present

## 2023-12-14 DIAGNOSIS — N2581 Secondary hyperparathyroidism of renal origin: Secondary | ICD-10-CM | POA: Diagnosis not present

## 2023-12-14 DIAGNOSIS — R109 Unspecified abdominal pain: Secondary | ICD-10-CM | POA: Diagnosis not present

## 2023-12-14 DIAGNOSIS — I700 Atherosclerosis of aorta: Secondary | ICD-10-CM | POA: Diagnosis not present

## 2023-12-14 DIAGNOSIS — D631 Anemia in chronic kidney disease: Secondary | ICD-10-CM | POA: Diagnosis not present

## 2023-12-14 DIAGNOSIS — N390 Urinary tract infection, site not specified: Secondary | ICD-10-CM | POA: Diagnosis not present

## 2023-12-14 DIAGNOSIS — J45909 Unspecified asthma, uncomplicated: Secondary | ICD-10-CM | POA: Diagnosis not present

## 2023-12-14 DIAGNOSIS — E039 Hypothyroidism, unspecified: Secondary | ICD-10-CM | POA: Insufficient documentation

## 2023-12-14 DIAGNOSIS — J984 Other disorders of lung: Secondary | ICD-10-CM | POA: Insufficient documentation

## 2023-12-14 DIAGNOSIS — R0602 Shortness of breath: Secondary | ICD-10-CM | POA: Diagnosis not present

## 2023-12-14 DIAGNOSIS — E876 Hypokalemia: Secondary | ICD-10-CM | POA: Diagnosis not present

## 2023-12-14 DIAGNOSIS — Z905 Acquired absence of kidney: Secondary | ICD-10-CM | POA: Insufficient documentation

## 2023-12-14 DIAGNOSIS — R1031 Right lower quadrant pain: Secondary | ICD-10-CM

## 2023-12-14 DIAGNOSIS — G4733 Obstructive sleep apnea (adult) (pediatric): Secondary | ICD-10-CM | POA: Insufficient documentation

## 2023-12-14 DIAGNOSIS — R112 Nausea with vomiting, unspecified: Secondary | ICD-10-CM | POA: Diagnosis not present

## 2023-12-14 DIAGNOSIS — I252 Old myocardial infarction: Secondary | ICD-10-CM | POA: Insufficient documentation

## 2023-12-14 DIAGNOSIS — I5032 Chronic diastolic (congestive) heart failure: Secondary | ICD-10-CM | POA: Insufficient documentation

## 2023-12-14 DIAGNOSIS — R079 Chest pain, unspecified: Secondary | ICD-10-CM | POA: Diagnosis not present

## 2023-12-14 DIAGNOSIS — Z992 Dependence on renal dialysis: Secondary | ICD-10-CM | POA: Insufficient documentation

## 2023-12-14 DIAGNOSIS — Z8673 Personal history of transient ischemic attack (TIA), and cerebral infarction without residual deficits: Secondary | ICD-10-CM | POA: Diagnosis not present

## 2023-12-14 DIAGNOSIS — Z952 Presence of prosthetic heart valve: Secondary | ICD-10-CM | POA: Diagnosis not present

## 2023-12-14 DIAGNOSIS — K4020 Bilateral inguinal hernia, without obstruction or gangrene, not specified as recurrent: Secondary | ICD-10-CM | POA: Diagnosis not present

## 2023-12-14 DIAGNOSIS — N281 Cyst of kidney, acquired: Secondary | ICD-10-CM | POA: Diagnosis not present

## 2023-12-14 DIAGNOSIS — R0789 Other chest pain: Secondary | ICD-10-CM | POA: Diagnosis not present

## 2023-12-14 DIAGNOSIS — N186 End stage renal disease: Secondary | ICD-10-CM | POA: Insufficient documentation

## 2023-12-14 DIAGNOSIS — D509 Iron deficiency anemia, unspecified: Secondary | ICD-10-CM | POA: Diagnosis not present

## 2023-12-14 LAB — COMPREHENSIVE METABOLIC PANEL
ALT: 17 U/L (ref 0–44)
AST: 14 U/L — ABNORMAL LOW (ref 15–41)
Albumin: 4 g/dL (ref 3.5–5.0)
Alkaline Phosphatase: 91 U/L (ref 38–126)
Anion gap: 13 (ref 5–15)
BUN: 23 mg/dL — ABNORMAL HIGH (ref 6–20)
CO2: 29 mmol/L (ref 22–32)
Calcium: 8.8 mg/dL — ABNORMAL LOW (ref 8.9–10.3)
Chloride: 96 mmol/L — ABNORMAL LOW (ref 98–111)
Creatinine, Ser: 6.73 mg/dL — ABNORMAL HIGH (ref 0.61–1.24)
GFR, Estimated: 9 mL/min — ABNORMAL LOW (ref >60)
Glucose, Bld: 116 mg/dL — ABNORMAL HIGH (ref 70–99)
Potassium: 3.3 mmol/L — ABNORMAL LOW (ref 3.5–5.1)
Sodium: 138 mmol/L (ref 135–145)
Total Bilirubin: 0.8 mg/dL (ref 0.0–1.2)
Total Protein: 8.1 g/dL (ref 6.5–8.1)

## 2023-12-14 LAB — CBC
HCT: 29 % — ABNORMAL LOW (ref 39.0–52.0)
Hemoglobin: 9.8 g/dL — ABNORMAL LOW (ref 13.0–17.0)
MCH: 32 pg (ref 26.0–34.0)
MCHC: 33.8 g/dL (ref 30.0–36.0)
MCV: 94.8 fL (ref 80.0–100.0)
Platelets: 261 10*3/uL (ref 150–400)
RBC: 3.06 MIL/uL — ABNORMAL LOW (ref 4.22–5.81)
RDW: 15.1 % (ref 11.5–15.5)
WBC: 5.7 10*3/uL (ref 4.0–10.5)
nRBC: 0 % (ref 0.0–0.2)

## 2023-12-14 LAB — RESP PANEL BY RT-PCR (RSV, FLU A&B, COVID)  RVPGX2
Influenza A by PCR: NEGATIVE
Influenza B by PCR: NEGATIVE
Resp Syncytial Virus by PCR: NEGATIVE
SARS Coronavirus 2 by RT PCR: NEGATIVE

## 2023-12-14 LAB — TROPONIN I (HIGH SENSITIVITY)
Troponin I (High Sensitivity): 58 ng/L — ABNORMAL HIGH (ref <18)
Troponin I (High Sensitivity): 61 ng/L — ABNORMAL HIGH (ref <18)

## 2023-12-14 LAB — LIPASE, BLOOD: Lipase: 54 U/L — ABNORMAL HIGH (ref 11–51)

## 2023-12-14 MED ORDER — CEFPODOXIME PROXETIL 200 MG PO TABS: 200.0000 mg | ORAL_TABLET | Freq: Every day | ORAL | 0 refills | Status: AC

## 2023-12-14 MED ORDER — METOPROLOL TARTRATE 25 MG PO TABS
25.0000 mg | ORAL_TABLET | Freq: Once | ORAL | Status: AC
  Administered 2023-12-14: 25 mg via ORAL
  Filled 2023-12-14: qty 1

## 2023-12-14 MED ORDER — CIPROFLOXACIN HCL 500 MG PO TABS
500.0000 mg | ORAL_TABLET | Freq: Once | ORAL | Status: AC
  Administered 2023-12-14: 500 mg via ORAL
  Filled 2023-12-14: qty 1

## 2023-12-14 NOTE — Telephone Encounter (Signed)
Copied from CRM [id number]. Topic: Clinical - Red Word Triage >> Dec 14, 2023  8:36 AM [place] E wrote: Patient is experiencing chest pain when coughing. States he had a heart attack in November of 2023, so he is thinking that it should have been better by now. Patient also experiencing morning sickness, which comes with nausea. Patient also has pain in the lower right side of his belly.   Chief Complaint: abdominal pain, nausea Symptoms: mild pain to RLQ, nausea, chest pain intermittently, prior dizziness, prior disorientation/confusion, prior vomiting, intermittent heart racing/fibrillation Frequency: intermittent Pertinent Negatives: Patient denies dry mouth, pain to scrotum/testicles, trouble standing/walking Disposition: [] 911 / [x] ED /[] Urgent Care (no appt availability in office) / [] Appointment(In office/virtual)/ []  [facility]/ [] Home Care/ [] Refused Recommended Disposition /[] [facility]/ []  Follow-up with PCP Additional Notes: Pt reporting pain to RLQ that "2, 3" out of 10, also been experiencing, nausea and vomiting over the last week, nausea intermittent and random for past "few months," fever "one point" last week, "didn't eat yesterday because wasn't feeling well, just nauseous, and throwing up" day before yesterday. Pt concerned for "beginnings of appendicitis." Pt unsure of swelling to abdomen, confirms no pain to scrotum/testicles. Pt confirms that he was dizzy/out of it last week, "kind of rambling last week," niece told him she noticed he "was not making any sense" but states he is "good today." Pt confirms he's been drinking water, confirms "don't urinate, doing dialysis, don't use the bathroom, if I do trickle or something" and states this is his usual. Pt confirms having some heart racing "some sort of fibrillation going on," pt has extensive and significant cardiac and renal hx. Advised ED to ensure doing okay, call 911 if disoriented/confused/too weak to  stand. Pt reporting he has "another couple hours on this machine," confirms at dialysis right now, confirms he can have niece drive him to ED after. Advised pt tell the nurses of his symptoms and check their recommendations as well. Pt verbalized understanding.  Reason for Disposition  Patient sounds very sick or weak to the triager  Answer Assessment - Initial Assessment Questions 1. LOCATION: "Where does it hurt?"      Right lower side of belly, confirms low like near pelvis 2. RADIATION: "Does the pain shoot anywhere else?" (e.g., chest, back)     Also experiencing chest pain from heart attack back in November 2023, thought pain would be gone by now, still when cough or sneeze, still experiencing pain all this time, they said it would go away by 6 months, not more frequent in past few days, been same for year now, when it happens 5 6 out of 10 6. SEVERITY: "How bad is the pain?"  (e.g., Scale 1-10; mild, moderate, or severe)    - MILD (1-3): Doesn't interfere with normal activities, abdomen soft and not tender to touch.     - MODERATE (4-7): Interferes with normal activities or awakens from sleep, abdomen tender to touch.     - SEVERE (8-10): Excruciating pain, doubled over, unable to do any normal activities.       Not that bad maybe 2 or 3 8. CAUSE: "What do you think is causing the stomach pain?"     Concerned it could be beginnings of appendicitis 10. OTHER SYMPTOMS: "Do you have any other symptoms?" (e.g., back pain, diarrhea, fever, urination pain, vomiting)       Nausea few months, intermittent and random Was sick last week, vomiting and not feeling  well, fever at one point, fever didn't last long. No vomiting or diarrhea, but nausea this morning, didn't eat yesterday because wasn't feeling well , just nauseous and throwing up day before yesterday, still feeling bad yesterday. Was dizzy/out of it last week, kind of rambling last week, niece is staying with me, noticed more that he was  not making any sense but good today. Still able to stand and walk. Feel like sometimes heart racing, some sort of fibrillation going on. No scrotum testicular pain. Don't know about swelling in stomach. Fever last week  Protocols used: Abdominal Pain - Male-A-AH

## 2023-12-14 NOTE — ED Triage Notes (Signed)
PT complains of N/V x 2 months. RLQ pain x 1 week. Threw up 2 days ago. Nothing to eat yesterday. Denies diarrhea

## 2023-12-14 NOTE — ED Provider Triage Note (Signed)
Emergency Medicine Provider Triage Evaluation Note  Albert Eaton , a 58 y.o. male  was evaluated in triage.  Pt complains of abdominal pain and vomiting.  Nausea and vomiting started about 2 months ago and has been persistent.  Now endorsing right lower quadrant abdominal pain that started a week ago.  Denies urinary or bowel changes.  Denies fever.  Also reports that he has had intermittent chest pain since his heart attack 2 years ago.  Hurts when he coughs.  History of ESRD.  Review of Systems  Positive: See above Negative: See above  Physical Exam  BP 127/87   Pulse 94   Temp 98.7 F (37.1 C) (Oral)   Resp 18   Ht 5\' 10"  (1.778 m)   Wt 90 kg   SpO2 97%   BMI 28.47 kg/m  Gen:   Awake, no distress   Resp:  Normal effort  MSK:   Moves extremities without difficulty  Other:    Medical Decision Making  Medically screening exam initiated at 1:02 PM.  Appropriate orders placed.  [name] was informed that the remainder of the evaluation will be completed by another provider, this initial triage assessment does not replace that evaluation, and the importance of remaining in the ED until their evaluation is complete.  Work up started   Gareth Eagle, PA-C 12/14/23 1303

## 2023-12-14 NOTE — Telephone Encounter (Signed)
Agree with immediate evaluation and disposition

## 2023-12-14 NOTE — ED Provider Notes (Signed)
[place] EMERGENCY DEPARTMENT AT [facility] Provider Note   CSN: [id number] Arrival date & time: 12/14/23  1247     History  Chief Complaint  Patient presents with   Abdominal Pain   Emesis    Albert Eaton is a 58 y.o. male with past medical history of chronic diastolic heart failure, OSA, hypothyroidism, asthma, Crohn's disease, HLD, IDA, prolonged QT, NSTEMI, CVA, ESRD on dialysis (MWF) presents to emergency department for evaluation of nausea, vomiting, decreased appetite for the past 2 months.  He threw up 2 days ago.  He  reports that he started having right lower quadrant pain for the past week describing it as 4/10 and dull.  He endorses a subjective fever last week.  No diarrhea.  Has been passing flatulence.  Last BM this morning and was normal.  He has not missed a dialysis appointment recently.  He also complains of productive cough for the past 2 weeks with associated mild shortness of breath that worsens with exertion.  He is unsure if this is worse than his normal although. No CP  He informed his family medicine provider regarding symptoms and they advised him to seek further evaluation in emergency department.   Abdominal Pain Associated symptoms: vomiting   Associated symptoms: no chest pain, no chills, no constipation, no cough, no diarrhea, no fatigue, no fever, no nausea and no shortness of breath   Emesis Associated symptoms: abdominal pain   Associated symptoms: no chills, no cough, no diarrhea, no fever and no headaches        Home Medications Prior to Admission medications   Medication Sig Start Date End Date Taking? Authorizing Provider  cefpodoxime (VANTIN) 200 MG tablet Take 1 tablet (200 mg total) by mouth daily for 10 days. 12/14/23 12/24/23 Yes Judithann Sheen, PA  acetaminophen (ACETAMINOPHEN 8 HOUR) 650 MG CR tablet Take 1,300 mg by mouth every 8 (eight) hours as needed for pain. 12/26/22 12/25/23  [provider]  allopurinol  (ZYLOPRIM) 100 MG tablet Take 1 tablet (100 mg total) by mouth daily. 10/23/23   [name], NP  amiodarone (PACERONE) 200 MG tablet Take 1 tablet (200 mg total) by mouth daily. 12/03/23   Tonny Bollman, MD  amLODipine (NORVASC) 2.5 MG tablet Take 1 tablet (2.5 mg total) by mouth daily. 10/19/23   Tonny Bollman, MD  apixaban (ELIQUIS) 5 MG TABS tablet Take 1 tablet (5 mg total) by mouth 2 (two) times daily. Patient not taking: Reported on 11/24/2023 10/19/23   Tonny Bollman, MD  azelastine (ASTELIN) 0.1 % nasal spray Place 2 sprays into both nostrils 2 (two) times daily as needed for allergies. 04/01/23   Alfonse Spruce, MD  B Complex-C-Zn-Folic Acid (DIALYVITE 800-ZINC 15 PO) Take 1 tablet by mouth daily.    [provider]  bictegravir-emtricitabine-tenofovir AF (BIKTARVY) 50-200-25 MG TABS tablet Take 1 tablet by mouth daily. 07/28/23   Comer, Belia Heman, MD  cetirizine (ZYRTEC) 10 MG tablet Take 1 tablet (10 mg total) by mouth daily. 04/20/23   Alfonse Spruce, MD  cinacalcet (SENSIPAR) 90 MG tablet Take 90 mg by mouth daily. 10/08/23   [provider]  diclofenac Sodium (VOLTAREN) 1 % GEL Apply 2 g topically 4 (four) times daily. Apply to chest pain when lidocaine patch not in place Patient taking differently: Apply 2 g topically 4 (four) times daily as needed (pain). 09/14/22   Tyrone Nine, MD  ezetimibe (ZETIA) 10 MG tablet Take 1  tablet (10 mg total) by mouth daily. Patient not taking: Reported on 11/24/2023 10/19/23   Tonny Bollman, MD  famotidine (PEPCID) 20 MG tablet Take 1 tablet (20 mg total) by mouth 2 (two) times daily. 11/13/22   Alfonse Spruce, MD  ferric citrate (AURYXIA) 1 GM 210 MG(Fe) tablet Take 420 mg by mouth See admin instructions. Take 420 mg by mouth three times a day with meals and 210 mg with each snack    [provider]  fluticasone (FLOVENT HFA) 110 MCG/ACT inhaler Inhale 2 puffs into the lungs 2 (two) times daily as  needed ("for flares").    [provider]  gabapentin (NEURONTIN) 300 MG capsule Take 1 capsule (300 mg total) by mouth daily as needed (pain/neuropathy). 09/14/22   Tyrone Nine, MD  ipratropium (ATROVENT) 0.06 % nasal spray USE TWO SPRAYS IN EACH NOSTRIL 2-3 times daily AS NEEDED 11/10/23   Alfonse Spruce, MD  lactulose (CHRONULAC) 10 GM/15ML solution Take 45 mLs (30 g total) by mouth daily as needed. Take 15-30 ml up to twice daily for constipation Patient taking differently: Take 40 g by mouth daily as needed. 01/08/23   Pyrtle, Carie Caddy, MD  levalbuterol [facility] HFA) 45 MCG/ACT inhaler Inhale 2 puffs into the lungs every 4 (four) hours as needed for wheezing. 11/04/23   Alfonse Spruce, MD  levothyroxine (SYNTHROID) 25 MCG tablet Take 1 tablet (25 mcg total) by mouth daily before breakfast. 10/27/23   [name], NP  lubiprostone (AMITIZA) 24 MCG capsule Take 1 capsule (24 mcg total) by mouth 2 (two) times daily with a meal. NEEDS APPT FOR FURTHER REFILLS 10/19/23   Pyrtle, Carie Caddy, MD  metoprolol tartrate (LOPRESSOR) 25 MG tablet Take 1 tablet (25 mg total) by mouth 2 (two) times daily. 11/04/23   Tonny Bollman, MD  Olopatadine HCl (PATADAY) 0.7 % SOLN Place 1 drop into both eyes 2 (two) times daily as needed (allergies). 11/13/22   Alfonse Spruce, MD  pantoprazole (PROTONIX) 40 MG tablet Take 1 tablet (40 mg total) by mouth daily. 06/01/23   Alfonse Spruce, MD  rOPINIRole (REQUIP) 1 MG tablet Take 1 tablet (1 mg total) by mouth at bedtime. 10/26/23   Olalere, Onnie Boer A, MD  rosuvastatin (CRESTOR) 10 MG tablet Take 1 tablet (10 mg total) by mouth daily. 10/19/23   Tonny Bollman, MD  sevelamer carbonate (RENVELA) 800 MG tablet Take 2,400 mg by mouth 3 (three) times daily as needed (Phosphorus Levels). 04/22/23   [provider]  Spacer/Aero-Holding Deretha Emory DEVI 1 Device by Does not apply route 2 (two) times a day. 03/01/19   Bobbitt, Heywood Iles, MD   SYMBICORT 160-4.5 MCG/ACT inhaler Inhale 2 puffs into the lungs in the morning and at bedtime. Patient taking differently: Inhale 2 puffs into the lungs 2 (two) times daily as needed (Asthma). 07/01/23   Alfonse Spruce, MD  tiZANidine (ZANAFLEX) 2 MG tablet Take 1 tablet (2 mg total) by mouth 2 (two) times daily as needed for muscle spasms. 07/07/23   [name], NP  Vitamin D, Ergocalciferol, (DRISDOL) 1.25 MG (50000 UNIT) CAPS capsule Take 1 capsule (50,000 Units total) by mouth every Tuesday. 01/06/23   [name], NP  warfarin (COUMADIN) 5 MG tablet Take 1-2 tablets daily or as prescribed by clinic 11/24/23   Tonny Bollman, MD      Allergies    Ace inhibitors    Review of Systems   Review of Systems  Constitutional:  Negative for chills, fatigue and fever.  Respiratory:  Negative for cough, chest tightness, shortness of breath and wheezing.   Cardiovascular:  Negative for chest pain and palpitations.  Gastrointestinal:  Positive for abdominal pain and vomiting. Negative for constipation, diarrhea and nausea.  Neurological:  Negative for dizziness, seizures, weakness, light-headedness, numbness and headaches.    Physical Exam Updated Vital Signs BP (!) 163/101 (BP Location: Right Arm)   Pulse 86   Temp 99.1 F (37.3 C) (Oral)   Resp (!) 25   Ht 5\' 10"  (1.778 m)   Wt 90 kg   SpO2 98%   BMI 28.47 kg/m  Physical Exam Vitals and nursing note reviewed.  Constitutional:      General: He is not in acute distress.    Appearance: Normal appearance. He is not ill-appearing.  HENT:     Head: Normocephalic and atraumatic.  Eyes:     Conjunctiva/sclera: Conjunctivae normal.  Cardiovascular:     Rate and Rhythm: Normal rate.  Pulmonary:     Effort: Pulmonary effort is normal. No respiratory distress.     Breath sounds: Normal breath sounds.     Comments: Mild tachypnea of 25.  Able to speak in full and complete sentences without difficulty.  Maintaining oxygen  saturation without difficulty nor supplementation Abdominal:     General: A surgical scar is present. Bowel sounds are normal. There is no distension.     Palpations: Abdomen is soft.     Tenderness: There is abdominal tenderness in the right lower quadrant. There is no right CVA tenderness, left CVA tenderness, guarding or rebound. Negative signs include Rovsing's sign.     Comments: Chronic surgical scars that do not appear infectious.  No peritoneal signs negative heeltap.  Musculoskeletal:     Right lower leg: No edema.     Left lower leg: No edema.     Comments: No calf TTP nor significant swelling bilaterally  Skin:    Capillary Refill: Capillary refill takes less than 2 seconds.     Coloration: Skin is not jaundiced or pale.     Comments: Fistula L side with thrill. No skin changes to indicate infection BUE  Neurological:     Mental Status: He is alert and oriented to person, place, and time. Mental status is at baseline.     ED Results / Procedures / Treatments   Labs (all labs ordered are listed, but only abnormal results are displayed) Labs Reviewed  LIPASE, BLOOD - Abnormal; Notable for the following components:      Result Value   Lipase 54 (*)    All other components within normal limits  COMPREHENSIVE METABOLIC PANEL - Abnormal; Notable for the following components:   Potassium 3.3 (*)    Chloride 96 (*)    Glucose, Bld 116 (*)    BUN 23 (*)    Creatinine, Ser 6.73 (*)    Calcium 8.8 (*)    AST 14 (*)    GFR, Estimated 9 (*)    All other components within normal limits  CBC - Abnormal; Notable for the following components:   RBC 3.06 (*)    Hemoglobin 9.8 (*)    HCT 29.0 (*)    All other components within normal limits  TROPONIN I (HIGH SENSITIVITY) - Abnormal; Notable for the following components:   Troponin I (High Sensitivity) 58 (*)    All other components within normal limits  TROPONIN I (HIGH SENSITIVITY) - Abnormal; Notable for the  following  components:   Troponin I (High Sensitivity) 61 (*)    All other components within normal limits  RESP PANEL BY RT-PCR (RSV, FLU A&B, COVID)  RVPGX2  URINALYSIS, ROUTINE W REFLEX MICROSCOPIC    EKG None  Radiology CT ABDOMEN PELVIS WO CONTRAST Result Date: 12/14/2023 CLINICAL DATA:  Right lower quadrant abdominal pain EXAM: CT ABDOMEN AND PELVIS WITHOUT CONTRAST TECHNIQUE: Multidetector CT imaging of the abdomen and pelvis was performed following the standard protocol without IV contrast. RADIATION DOSE REDUCTION: This exam was performed according to the departmental dose-optimization program which includes automated exposure control, adjustment of the mA and/or kV according to patient size and/or use of iterative reconstruction technique. COMPARISON:  09/08/2022 FINDINGS: Lower chest: Small left pleural effusion, slightly increased since prior examination. Right basilar pleuroparenchymal scarring. Status post mitral valve replacement. Mild global cardiomegaly. Hepatobiliary: No focal liver abnormality is seen. Status post cholecystectomy. No biliary dilatation. Pancreas: Unremarkable Spleen: Unremarkable Adrenals/Urinary Tract: The adrenal glands are unremarkable. Status post left nephrectomy. The right kidney is normal in position but appears moderately atrophic. Simple cortical cyst again noted within the upper pole of the right kidney for which no follow-up imaging is recommended. Extensive perinephric stranding is present, slightly progressive since prior examination, possibly reflecting a superimposed acute inflammatory process such as pyelonephritis. There is no hydronephrosis. No intrarenal or ureteral calculi. The bladder is decompressed. There is, however, mild superimposed perivesicular inflammatory stranding which may reflect changes of a superimposed infectious or inflammatory cystitis. Stomach/Bowel: Stomach is within normal limits. Appendix appears normal. No evidence of bowel wall  thickening, distention, or inflammatory changes. Vascular/Lymphatic: Aortic atherosclerosis. No enlarged abdominal or pelvic lymph nodes. Reproductive: Prostate is unremarkable. Other: Small fat containing bilateral inguinal hernias are present Musculoskeletal: Multiple Schmorl's nodes are seen throughout the visualized thoracolumbar spine. No acute bone abnormality. No lytic or blastic lesion. IMPRESSION: 1. Mild perivesicular inflammatory stranding. Extensive perinephric stranding, slightly progressive since prior examination. Together, the findings may reflect changes of an infectious cystitis and ascending urinary tract infection/pyelonephritis. Correlation with urinalysis and urine culture may be helpful. 2. Small left pleural effusion, slightly increased since prior examination. 3. Mild global cardiomegaly. Aortic Atherosclerosis (ICD10-I70.0). Electronically Signed   By: Helyn Numbers M.D.   On: 12/14/2023 21:56   DG Chest 1 View Result Date: 12/14/2023 CLINICAL DATA:  Chest and abdominal pain.  Vomiting. EXAM: CHEST  1 VIEW COMPARISON:  Chest radiograph dated 10/27/2023. FINDINGS: Right lung base density, new since the prior radiograph and concerning for developing infiltrate. Clinical correlation and follow-up to resolution recommended. Minimal left lung base atelectasis. Trace bilateral pleural effusions may be present. No pneumothorax. Stable cardiac silhouette. Mitral bowel repair. No acute osseous pathology. IMPRESSION: Right lung base density concerning for developing infiltrate. Electronically Signed   By: Elgie Collard M.D.   On: 12/14/2023 15:59    Procedures Procedures    Medications Ordered in ED Medications  ciprofloxacin (CIPRO) tablet 500 mg (500 mg Oral Given 12/14/23 2217)  metoprolol tartrate (LOPRESSOR) tablet 25 mg (25 mg Oral Given 12/14/23 2217)    ED Course/ Medical Decision Making/ A&P                                 Medical Decision Making Amount and/or  Complexity of Data Reviewed Labs: ordered. Radiology: ordered.  Risk Prescription drug management.   Patient presents to the ED for concern of nausea, vomiting for past 2  months, right lower quadrant pain for the past week, decreased appetite, this involves an extensive number of treatment options, and is a complaint that carries with it a high risk of complications and morbidity.  The differential diagnosis includes COVID, flu, RSV, gastroenteritis, UTI, pneumonia, electrolyte abnormality, appendicitis, diverticulitis.  Not an exhaustive list   Co morbidities that complicate the patient evaluation  See HPI   Additional history obtained:  Additional history obtained from Nursing   External records from outside source obtained and reviewed including triage RN note   Lab Tests:  I Ordered, and personally interpreted labs.  The pertinent results include:   Hemoglobin 9.8 (baseline has been 10.7-13 over past year)-likely due to anemia of chronic disease Potassium 3.3 (baseline 3.2-3.9 over past 9 months) CBG 116 BUN 23 (baseline has been 24-46 over past 9 months) Creatinine 6.73 (on dialysis for ESRD) Calcium 8.8 (1 month ago was 7.8) Lipase 54 (was 58 9 months ago) Troponin 58, troponin 61 (baseline 30-35 over past year and likely due to CKD)   Imaging Studies ordered:  I ordered imaging studies including chest x-ray and CT abdomen pelvis without contrast I independently visualized and interpreted imaging which showed  Chest x-ray significant for developing infiltrate on chest x-ray CT abdomen pelvis significant for Mild perivesicular inflammatory stranding. Extensive perinephric stranding, slightly progressive since prior examination. Together, the findings may reflect changes of an infectious cystitis and ascending urinary tract infection/pyelonephritis. Correlation with urinalysis and urine culture may be helpful. Small left pleural effusion, slightly increased since  prior examination. Mild global cardiomegaly. I agree with the radiologist interpretation   Cardiac Monitoring:  The patient was maintained on a cardiac monitor.  I personally viewed and interpreted the cardiac monitored which showed an underlying rhythm of: NSR with LVH and prolonged QT.  Unchanged from prior   Medicines ordered and prescription drug management:  I ordered medication including ciprofloxacin,cefpodoxime, metoprolol, for UTI, PNA, HTN  Reevaluation of the patient after these medicines showed that the patient improved I have reviewed the patients home medicines and have made adjustments as needed     Problem List / ED Course:  UTI Nausea and vomiting Right lower quadrant abdominal pain Troponins flat and similar to baseline likely due to ESRD Hemoglobin 9.8 and likely due to due to ESRD.  He denies bloody stool, hematochezia CT abdomen pelvis without contrast significant for findings concerning for UTI.  Patient does not make urine and cannot obtain UA.  Although this is mildly surprising for someone who has not made urine for greater than a year, will treat with possible pneumonia infection Initially treated with cipro in ED. However, patient is currently on warfarin. Will have pt follow up with warfarin clinic tomorrow to have INR redrawn to ensure no significant elevation.  Most recent INR on 12/08/2023 was 2.  I emphasized the importance of following up for INR recheck. Will treat possible pneumonia and UTI with renally dosed cefpodoxime.  I discussed importance of taking antibiotics following dialysis   Reevaluation:  After the interventions noted above, I reevaluated the patient and found that they have :stayed the same   Social Determinants of Health:  Has f/u   Dispostion:  After consideration of the diagnostic results and the patients response to treatment, I feel that the patent would benefit from outpatient management and treatment of UTI and  possible PNA  Dr. Renaye Rakers reviewed ED workup and agrees with plan Final Clinical Impression(s) / ED Diagnoses Final diagnoses:  Urinary tract  infection without hematuria, site unspecified  Nausea and vomiting, unspecified vomiting type  RLQ abdominal pain    Rx / DC Orders ED Discharge Orders          Ordered    cefpodoxime (VANTIN) 200 MG tablet  Daily        12/14/23 2240              Judithann Sheen, PA 12/14/23 2323    Terald Sleeper, MD 12/15/23 [phone]

## 2023-12-14 NOTE — Discharge Instructions (Addendum)
Thank you for letting us evaluate you today.  You might have a right lower lobe pneumonia vs scarring as well as a possible urinary tract infection.  I have sent an antibiotic (cefpodoxime) to your pharmacy to take once a day to cover for both of these possibilities.  Please make sure to take antibiotic following dialysis. Please call warfarin clinic to check INR tomorrow or next day as ciprofloxacin may increase INR  Return to ED if worsening of symptoms

## 2023-12-14 NOTE — ED Notes (Signed)
Dialysis pt does not urinate   at

## 2023-12-15 ENCOUNTER — Ambulatory Visit: Payer: 59 | Attending: Cardiovascular Disease | Admitting: *Deleted

## 2023-12-15 DIAGNOSIS — I480 Paroxysmal atrial fibrillation: Secondary | ICD-10-CM

## 2023-12-15 LAB — POCT INR: INR: 2.4 (ref 2.0–3.0)

## 2023-12-15 NOTE — Patient Instructions (Addendum)
Continue taking 2 tablets Daily except 1 tablet every Monday, Wednesday and Friday.  Recheck INR 1 week.  On Abx for UTI (Cefpodoxime 200mg  daily x 10 days)   Pending  RIGHT IMPLANTATION OF HYPOGLOSSAL NERVE STIMULATOR  procedure on 12/30/23.  Will need to hold warfarin 5 days before procedure. [facility] [phone] Clearance Pre-Op Form Fax to [phone] or [phone]

## 2023-12-16 DIAGNOSIS — D631 Anemia in chronic kidney disease: Secondary | ICD-10-CM | POA: Diagnosis not present

## 2023-12-16 DIAGNOSIS — E876 Hypokalemia: Secondary | ICD-10-CM | POA: Diagnosis not present

## 2023-12-16 DIAGNOSIS — D509 Iron deficiency anemia, unspecified: Secondary | ICD-10-CM | POA: Diagnosis not present

## 2023-12-16 DIAGNOSIS — N2581 Secondary hyperparathyroidism of renal origin: Secondary | ICD-10-CM | POA: Diagnosis not present

## 2023-12-16 DIAGNOSIS — Z992 Dependence on renal dialysis: Secondary | ICD-10-CM | POA: Diagnosis not present

## 2023-12-16 DIAGNOSIS — N186 End stage renal disease: Secondary | ICD-10-CM | POA: Diagnosis not present

## 2023-12-18 ENCOUNTER — Other Ambulatory Visit: Payer: Self-pay | Admitting: Allergy & Immunology

## 2023-12-18 DIAGNOSIS — N2581 Secondary hyperparathyroidism of renal origin: Secondary | ICD-10-CM | POA: Diagnosis not present

## 2023-12-18 DIAGNOSIS — D631 Anemia in chronic kidney disease: Secondary | ICD-10-CM | POA: Diagnosis not present

## 2023-12-18 DIAGNOSIS — Z992 Dependence on renal dialysis: Secondary | ICD-10-CM | POA: Diagnosis not present

## 2023-12-18 DIAGNOSIS — D509 Iron deficiency anemia, unspecified: Secondary | ICD-10-CM | POA: Diagnosis not present

## 2023-12-18 DIAGNOSIS — E876 Hypokalemia: Secondary | ICD-10-CM | POA: Diagnosis not present

## 2023-12-18 DIAGNOSIS — N186 End stage renal disease: Secondary | ICD-10-CM | POA: Diagnosis not present

## 2023-12-20 NOTE — Telephone Encounter (Signed)
 Mychart sent of sleep study results. Patient has not been seen since 06/2022. Patient needs appt.

## 2023-12-21 DIAGNOSIS — D509 Iron deficiency anemia, unspecified: Secondary | ICD-10-CM | POA: Diagnosis not present

## 2023-12-21 DIAGNOSIS — N186 End stage renal disease: Secondary | ICD-10-CM | POA: Diagnosis not present

## 2023-12-21 DIAGNOSIS — N2581 Secondary hyperparathyroidism of renal origin: Secondary | ICD-10-CM | POA: Diagnosis not present

## 2023-12-21 DIAGNOSIS — D631 Anemia in chronic kidney disease: Secondary | ICD-10-CM | POA: Diagnosis not present

## 2023-12-21 DIAGNOSIS — Z992 Dependence on renal dialysis: Secondary | ICD-10-CM | POA: Diagnosis not present

## 2023-12-21 DIAGNOSIS — E876 Hypokalemia: Secondary | ICD-10-CM | POA: Diagnosis not present

## 2023-12-21 NOTE — Progress Notes (Signed)
 Surgical Instructions   Your procedure is scheduled on Wednesday, Brock 5th, 2025. Report to [facility] Main Entrance "A" at 9:00 A.M., then check in with the Admitting office. Any questions or running late day of surgery: call [phone]  Questions prior to your surgery date: call [phone], Monday-Friday, 8am-4pm. If you experience any cold or flu symptoms such as cough, fever, chills, shortness of breath, etc. between now and your scheduled surgery, please notify us at the above number.     Remember:  Do not eat after midnight the night before your surgery   You may drink clear liquids until 8:00 the morning of your surgery.   Clear liquids allowed are: Water, Non-Citrus Juices (without pulp), Carbonated Beverages, Clear Tea (no milk, honey, etc.), Black Coffee Only (NO MILK, CREAM OR POWDERED CREAMER of any kind), and Gatorade.    Take these medicines the morning of surgery with A SIP OF WATER: Allopurinol (Zyloprim) Amiodarone (Pacerone) Amlodipine (Norvasc) Bictegravir-emtricitabine-tenofovir AF (Biktarvy) Cefpodoxime (Vantin) Cetirizine (Zyrtec) Cinacalacet (Sensipar) Famotidine (Pepcid) Levothyroxine (Synthroid) Metoprolol Tartrate (Lopressor) Pantoprazole (Protonix) Rosuvastatin (Crestor)   May take these medicines IF NEEDED: Acetaminophen (Tylenol) Azelastine (Astelin) Nasal Spray Fluticasone (Flovent) inhaler Ipratropium (Atrovent) nasal spray Levalbuterol (Xopenex) inhaler - bring with you on the day of surgery Olopatadine HCL eye drop Tizanidine (Zanaflex)   Per your doctor's instructions, Warfarin should be held for 5 days prior to surgery.  Your last dose should be on Thursday, February 27th, 2025.   One week prior to surgery, STOP taking any Aspirin (unless otherwise instructed by your surgeon) Aleve, Naproxen, Ibuprofen, Motrin, Advil, Goody's, BC's, all herbal medications, fish oil, and non-prescription vitamins.  This includes your Diclofenac  Sodium (Voltaren) Gel.                     Do NOT Smoke (Tobacco/Vaping) for 24 hours prior to your procedure.  If you use a CPAP at night, you may bring your mask/headgear for your overnight stay.   You will be asked to remove any contacts, glasses, piercing's, hearing aid's, dentures/partials prior to surgery. Please bring cases for these items if needed.    Patients discharged the day of surgery will not be allowed to drive home, and someone needs to stay with them for 24 hours.  SURGICAL WAITING ROOM VISITATION Patients may have no more than 2 support people in the waiting area - these visitors may rotate.   Pre-op nurse will coordinate an appropriate time for 1 ADULT support person, who may not rotate, to accompany patient in pre-op.  Children under the age of 34 must have an adult with them who is not the patient and must remain in the main waiting area with an adult.  If the patient needs to stay at the hospital during part of their recovery, the visitor guidelines for inpatient rooms apply.  Please refer to the [facility] website for the visitor guidelines for any additional information.   If you received a COVID test during your pre-op visit  it is requested that you wear a mask when out in public, stay away from anyone that may not be feeling well and notify your surgeon if you develop symptoms. If you have been in contact with anyone that has tested positive in the last 10 days please notify you surgeon.      Pre-operative CHG Bathing Instructions   You can play a key role in reducing the risk of infection after surgery. Your skin needs to be as  free of germs as possible. You can reduce the number of germs on your skin by washing with CHG (chlorhexidine gluconate) soap before surgery. CHG is an antiseptic soap that kills germs and continues to kill germs even after washing.   DO NOT use if you have an allergy to chlorhexidine/CHG or antibacterial soaps. If your skin becomes  reddened or irritated, stop using the CHG and notify one of our RNs at [phone].              TAKE A SHOWER THE NIGHT BEFORE SURGERY AND THE DAY OF SURGERY    Please keep in mind the following:  DO NOT shave, including legs and underarms, 48 hours prior to surgery.   You may shave your face before/day of surgery.  Place clean sheets on your bed the night before surgery Use a clean washcloth (not used since being washed) for each shower. DO NOT sleep with pet's night before surgery.  CHG Shower Instructions:  Wash your face and private area with normal soap. If you choose to wash your hair, wash first with your normal shampoo.  After you use shampoo/soap, rinse your hair and body thoroughly to remove shampoo/soap residue.  Turn the water OFF and apply half the bottle of CHG soap to a CLEAN washcloth.  Apply CHG soap ONLY FROM YOUR NECK DOWN TO YOUR TOES (washing for 3-5 minutes)  DO NOT use CHG soap on face, private areas, open wounds, or sores.  Pay special attention to the area where your surgery is being performed.  If you are having back surgery, having someone wash your back for you may be helpful. Wait 2 minutes after CHG soap is applied, then you may rinse off the CHG soap.  Pat dry with a clean towel  Put on clean pajamas    Additional instructions for the day of surgery: DO NOT APPLY any lotions, deodorants, cologne, or perfumes.   Do not wear jewelry or makeup Do not wear nail polish, gel polish, artificial nails, or any other type of covering on natural nails (fingers and toes) Do not bring valuables to the hospital. [facility] is not responsible for valuables/personal belongings. Put on clean/comfortable clothes.  Please brush your teeth.  Ask your nurse before applying any prescription medications to the skin.

## 2023-12-21 NOTE — Anesthesia Preprocedure Evaluation (Addendum)
 Anesthesia Evaluation  Patient identified by MRN, date of birth, ID band Patient awake    Reviewed: Allergy & Precautions, NPO status , Patient's Chart, lab work & pertinent test results  Airway Mallampati: II  TM Distance: >3 FB Neck ROM: Full    Dental  (+) Missing, Dental Advisory Given,    Pulmonary asthma , sleep apnea , former smoker Obstructive sleep apnea   breath sounds clear to auscultation   (-) rales    Cardiovascular hypertension, Pt. on medications (-) angina + CAD, + Past MI and +CHF  + dysrhythmias Atrial Fibrillation + Valvular Problems/Murmurs  Rhythm:Irregular Rate:Tachycardia  Echo 01/2023  1. No pericardial effusion.   2. Left atrial size was severely dilated.   3. Post mitral repair with trivial residual MR . The mitral valve has been repaired/replaced. There is a 30 mm Medtronic prosthetic annuloplasty ring. present in the mitral position. Procedure Date: 07/2022.   4. Tricuspid valve regurgitation is moderate.   5. The aortic valve is tricuspid. Aortic valve regurgitation is not visualized. No aortic stenosis is present.   6. Aortic dilatation noted. There is mild dilatation of the ascending aorta, measuring 39 mm.    Cath 08/2022   Mid LAD lesion is 50% stenosed.   Prox LAD lesion is 50% stenosed.   Prox RCA lesion is 40% stenosed.   2nd Mrg lesion is 30% stenosed.   LV end diastolic pressure is mildly elevated.   There is no aortic valve stenosis.   No change in coronary anatomy since October 2023.  Consider EP referral for consideration of AICD.     Echo 08/2022  1. Left ventricular ejection fraction, by estimation, is 45 to 50%. The left ventricle has mildly decreased function. The left ventricle demonstrates global hypokinesis. There is moderate concentric left ventricular hypertrophy. Left ventricular diastolic function could not be evaluated.   2. Right ventricular systolic function was  not well visualized. The right ventricular size is moderately enlarged.   3. The mitral valve has been repaired/replaced. Trivial mitral valve regurgitation. There is a 30 mm Medtronic prosthetic annuloplasty ring present in the mitral position. Procedure Date: 08/21/22.   4. The aortic valve is grossly normal. Aortic valve regurgitation is not visualized.   Comparison(s): No significant change from prior study.      Neuro/Psych  Headaches CVA, No Residual Symptoms    GI/Hepatic ,GERD  Medicated,,(+) Hepatitis -, B  Endo/Other  Hypothyroidism    Renal/GU ESRF and DialysisRenal disease     Musculoskeletal  (+) Arthritis ,    Abdominal   Peds  Hematology  (+) Blood dyscrasia (Warfarin), anemia , HIV  Anesthesia Other Findings   Reproductive/Obstetrics                              Anesthesia Physical Anesthesia Plan  ASA: 4  Anesthesia Plan: General   Post-op Pain Management: Tylenol PO (pre-op)*   Induction: Intravenous  PONV Risk Score and Plan: Ondansetron, Dexamethasone and Treatment may vary due to age or medical condition  Airway Management Planned: Oral ETT  Additional Equipment: ClearSight  Intra-op Plan:   Post-operative Plan: Extubation in OR  Informed Consent: I have reviewed the patients History and Physical, chart, labs and discussed the procedure including the risks, benefits and alternatives for the proposed anesthesia with the patient or authorized representative who has indicated his/her understanding and acceptance.     Dental advisory given  Plan Discussed  with: CRNA  Anesthesia Plan Comments:         Anesthesia Quick Evaluation

## 2023-12-22 ENCOUNTER — Inpatient Hospital Stay (HOSPITAL_COMMUNITY)
Admission: RE | Admit: 2023-12-22 | Discharge: 2023-12-22 | Disposition: A | Discharge status: Home / Self Care | Payer: 59 | Source: Ambulatory Visit

## 2023-12-23 ENCOUNTER — Ambulatory Visit: Payer: 59 | Attending: Cardiovascular Disease

## 2023-12-23 DIAGNOSIS — E876 Hypokalemia: Secondary | ICD-10-CM | POA: Diagnosis not present

## 2023-12-23 DIAGNOSIS — N2581 Secondary hyperparathyroidism of renal origin: Secondary | ICD-10-CM | POA: Diagnosis not present

## 2023-12-23 DIAGNOSIS — I480 Paroxysmal atrial fibrillation: Secondary | ICD-10-CM

## 2023-12-23 DIAGNOSIS — N186 End stage renal disease: Secondary | ICD-10-CM | POA: Diagnosis not present

## 2023-12-23 DIAGNOSIS — D509 Iron deficiency anemia, unspecified: Secondary | ICD-10-CM | POA: Diagnosis not present

## 2023-12-23 DIAGNOSIS — Z992 Dependence on renal dialysis: Secondary | ICD-10-CM | POA: Diagnosis not present

## 2023-12-23 DIAGNOSIS — D631 Anemia in chronic kidney disease: Secondary | ICD-10-CM | POA: Diagnosis not present

## 2023-12-23 LAB — POCT INR: INR: 1.3 — AB (ref 2.0–3.0)

## 2023-12-23 MED ORDER — WARFARIN SODIUM 5 MG PO TABS: ORAL_TABLET | ORAL | 0 refills | Status: DC

## 2023-12-23 NOTE — Patient Instructions (Signed)
 Description   Take 2 tablets today and 2.5 tablets tomorrow and then continue taking 2 tablets Daily except 1 tablet every Monday, Wednesday and Friday.  Recheck INR 1 week.  Pending  RIGHT IMPLANTATION OF HYPOGLOSSAL NERVE STIMULATOR  procedure on 01/14/24. Will need to hold warfarin 5 days before procedure. [facility] [phone] Clearance Pre-Op Form Fax to [phone] or [phone]

## 2023-12-25 ENCOUNTER — Encounter: Payer: Self-pay | Admitting: Pulmonary Disease

## 2023-12-25 DIAGNOSIS — E876 Hypokalemia: Secondary | ICD-10-CM | POA: Diagnosis not present

## 2023-12-25 DIAGNOSIS — N186 End stage renal disease: Secondary | ICD-10-CM | POA: Diagnosis not present

## 2023-12-25 DIAGNOSIS — I129 Hypertensive chronic kidney disease with stage 1 through stage 4 chronic kidney disease, or unspecified chronic kidney disease: Secondary | ICD-10-CM | POA: Diagnosis not present

## 2023-12-25 DIAGNOSIS — D509 Iron deficiency anemia, unspecified: Secondary | ICD-10-CM | POA: Diagnosis not present

## 2023-12-25 DIAGNOSIS — N2581 Secondary hyperparathyroidism of renal origin: Secondary | ICD-10-CM | POA: Diagnosis not present

## 2023-12-25 DIAGNOSIS — Z992 Dependence on renal dialysis: Secondary | ICD-10-CM | POA: Diagnosis not present

## 2023-12-25 DIAGNOSIS — D631 Anemia in chronic kidney disease: Secondary | ICD-10-CM | POA: Diagnosis not present

## 2023-12-28 DIAGNOSIS — D509 Iron deficiency anemia, unspecified: Secondary | ICD-10-CM | POA: Diagnosis not present

## 2023-12-28 DIAGNOSIS — N186 End stage renal disease: Secondary | ICD-10-CM | POA: Diagnosis not present

## 2023-12-28 DIAGNOSIS — E876 Hypokalemia: Secondary | ICD-10-CM | POA: Diagnosis not present

## 2023-12-28 DIAGNOSIS — N2581 Secondary hyperparathyroidism of renal origin: Secondary | ICD-10-CM | POA: Diagnosis not present

## 2023-12-28 DIAGNOSIS — D689 Coagulation defect, unspecified: Secondary | ICD-10-CM | POA: Diagnosis not present

## 2023-12-28 DIAGNOSIS — D631 Anemia in chronic kidney disease: Secondary | ICD-10-CM | POA: Diagnosis not present

## 2023-12-28 DIAGNOSIS — Z992 Dependence on renal dialysis: Secondary | ICD-10-CM | POA: Diagnosis not present

## 2023-12-28 DIAGNOSIS — T8249XD Other complication of vascular dialysis catheter, subsequent encounter: Secondary | ICD-10-CM | POA: Diagnosis not present

## 2023-12-30 ENCOUNTER — Ambulatory Visit: Payer: 59 | Attending: Cardiology

## 2023-12-30 DIAGNOSIS — Z5181 Encounter for therapeutic drug level monitoring: Secondary | ICD-10-CM | POA: Diagnosis not present

## 2023-12-30 DIAGNOSIS — I480 Paroxysmal atrial fibrillation: Secondary | ICD-10-CM | POA: Diagnosis not present

## 2023-12-30 DIAGNOSIS — D689 Coagulation defect, unspecified: Secondary | ICD-10-CM | POA: Diagnosis not present

## 2023-12-30 DIAGNOSIS — D631 Anemia in chronic kidney disease: Secondary | ICD-10-CM | POA: Diagnosis not present

## 2023-12-30 DIAGNOSIS — N2581 Secondary hyperparathyroidism of renal origin: Secondary | ICD-10-CM | POA: Diagnosis not present

## 2023-12-30 DIAGNOSIS — N186 End stage renal disease: Secondary | ICD-10-CM | POA: Diagnosis not present

## 2023-12-30 DIAGNOSIS — E876 Hypokalemia: Secondary | ICD-10-CM | POA: Diagnosis not present

## 2023-12-30 DIAGNOSIS — Z992 Dependence on renal dialysis: Secondary | ICD-10-CM | POA: Diagnosis not present

## 2023-12-30 DIAGNOSIS — T8249XD Other complication of vascular dialysis catheter, subsequent encounter: Secondary | ICD-10-CM | POA: Diagnosis not present

## 2023-12-30 DIAGNOSIS — D509 Iron deficiency anemia, unspecified: Secondary | ICD-10-CM | POA: Diagnosis not present

## 2023-12-30 LAB — POCT INR: INR: 3.5 — AB (ref 2.0–3.0)

## 2023-12-30 NOTE — Patient Instructions (Signed)
 Description   Take 1/2 tablet today and then continue taking 2 tablets Daily except 1 tablet every Monday, Wednesday and Friday.  Recheck INR 1 week.  Pending  RIGHT IMPLANTATION OF HYPOGLOSSAL NERVE STIMULATOR  procedure on 01/14/24. Will need to hold warfarin 5 days before procedure. - clearance on 11/17/23 [facility] [phone] Clearance Pre-Op Form Fax to [phone] or [phone]

## 2023-12-31 ENCOUNTER — Ambulatory Visit
Admission: RE | Admit: 2023-12-31 | Discharge: 2023-12-31 | Disposition: A | Discharge status: Home / Self Care | Source: Ambulatory Visit | Attending: Nurse Practitioner | Admitting: Nurse Practitioner

## 2023-12-31 ENCOUNTER — Ambulatory Visit (INDEPENDENT_AMBULATORY_CARE_PROVIDER_SITE_OTHER): Admitting: Nurse Practitioner

## 2023-12-31 VITALS — BP 140/96 | HR 130 | Temp 98.4°F | Ht 70.0 in | Wt 198.4 lb

## 2023-12-31 DIAGNOSIS — R079 Chest pain, unspecified: Secondary | ICD-10-CM | POA: Diagnosis not present

## 2023-12-31 DIAGNOSIS — Z8701 Personal history of pneumonia (recurrent): Secondary | ICD-10-CM

## 2023-12-31 DIAGNOSIS — R000 Tachycardia, unspecified: Secondary | ICD-10-CM

## 2023-12-31 DIAGNOSIS — R0789 Other chest pain: Secondary | ICD-10-CM | POA: Diagnosis not present

## 2023-12-31 DIAGNOSIS — M799 Soft tissue disorder, unspecified: Secondary | ICD-10-CM | POA: Diagnosis not present

## 2023-12-31 NOTE — Assessment & Plan Note (Signed)
 Patient was seen in the emergency department approximately a month ago with a right lower lobe infiltrate.  Chest x-ray to check resolution today.

## 2023-12-31 NOTE — Patient Instructions (Signed)
 Nice to see you today Check blood pressure and heart rate at home I will be in touch with the results once I have them

## 2023-12-31 NOTE — Assessment & Plan Note (Signed)
 History of paroxysmal atrial fibrillation.  Patient is tachycardic in office today.  Patient states he is late on taking his metoprolol dose EKG shows atrial flutter with a rate of 121.  Patient will check pulse at home with his blood pressure meter.  He will take his second dose of metoprolol.  He does have dialysis in the morning they will recheck his vital signs.  Did instruct him if several hours after taking second dose of metoprolol his pulse is still above the 100 and his blood pressure can handle that he can take an additional 12.5 mg of the metoprolol.
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
## 2024-01-01 DIAGNOSIS — D509 Iron deficiency anemia, unspecified: Secondary | ICD-10-CM | POA: Diagnosis not present

## 2024-01-01 DIAGNOSIS — E876 Hypokalemia: Secondary | ICD-10-CM | POA: Diagnosis not present

## 2024-01-01 DIAGNOSIS — D631 Anemia in chronic kidney disease: Secondary | ICD-10-CM | POA: Diagnosis not present

## 2024-01-01 DIAGNOSIS — Z992 Dependence on renal dialysis: Secondary | ICD-10-CM | POA: Diagnosis not present

## 2024-01-01 DIAGNOSIS — N2581 Secondary hyperparathyroidism of renal origin: Secondary | ICD-10-CM | POA: Diagnosis not present

## 2024-01-01 DIAGNOSIS — T8249XD Other complication of vascular dialysis catheter, subsequent encounter: Secondary | ICD-10-CM | POA: Diagnosis not present

## 2024-01-01 DIAGNOSIS — D689 Coagulation defect, unspecified: Secondary | ICD-10-CM | POA: Diagnosis not present

## 2024-01-01 DIAGNOSIS — N186 End stage renal disease: Secondary | ICD-10-CM | POA: Diagnosis not present

## 2024-01-04 ENCOUNTER — Encounter: Payer: Self-pay | Admitting: Nurse Practitioner

## 2024-01-04 DIAGNOSIS — N2581 Secondary hyperparathyroidism of renal origin: Secondary | ICD-10-CM | POA: Diagnosis not present

## 2024-01-04 DIAGNOSIS — T8249XD Other complication of vascular dialysis catheter, subsequent encounter: Secondary | ICD-10-CM | POA: Diagnosis not present

## 2024-01-04 DIAGNOSIS — E876 Hypokalemia: Secondary | ICD-10-CM | POA: Diagnosis not present

## 2024-01-04 DIAGNOSIS — D689 Coagulation defect, unspecified: Secondary | ICD-10-CM | POA: Diagnosis not present

## 2024-01-04 DIAGNOSIS — Z992 Dependence on renal dialysis: Secondary | ICD-10-CM | POA: Diagnosis not present

## 2024-01-04 DIAGNOSIS — D509 Iron deficiency anemia, unspecified: Secondary | ICD-10-CM | POA: Diagnosis not present

## 2024-01-04 DIAGNOSIS — N186 End stage renal disease: Secondary | ICD-10-CM | POA: Diagnosis not present

## 2024-01-04 DIAGNOSIS — D631 Anemia in chronic kidney disease: Secondary | ICD-10-CM | POA: Diagnosis not present

## 2024-01-05 ENCOUNTER — Encounter: Payer: Self-pay | Admitting: Vascular Surgery

## 2024-01-05 ENCOUNTER — Ambulatory Visit (INDEPENDENT_AMBULATORY_CARE_PROVIDER_SITE_OTHER): Payer: 59 | Admitting: Vascular Surgery

## 2024-01-05 VITALS — BP 158/97 | HR 80 | Temp 98.4°F | Resp 20 | Ht 70.0 in | Wt 203.9 lb

## 2024-01-05 DIAGNOSIS — N186 End stage renal disease: Secondary | ICD-10-CM | POA: Diagnosis not present

## 2024-01-05 DIAGNOSIS — Z992 Dependence on renal dialysis: Secondary | ICD-10-CM

## 2024-01-05 NOTE — Progress Notes (Signed)
 Patient name: Albert Eaton MRN: [id number] DOB: [birth year] Sex: male  REASON FOR CONSULT: Thinning area with scab over left arm access  HPI: Albert Eaton Clerk is a 58 y.o. male, with history of CHF, ESRD, HIV, hypertension, hyperlipidemia that presents for evaluation of a thinning area with scab over left arm access.  Patient has a left basilic vein fistula placed in 2019.  He has also had a previous PD catheter removed.  States has a scab over his left arm AV fistula and this area has been getting thinner in regards to the skin.  Fistula is working well.  No bleeding events.  Past Medical History:  Diagnosis Date   Anemia    Aortic atherosclerosis (HCC)    Arthritis    Ascending aorta dilation (HCC) 02/17/2023   Limited TTE 02/17/23: no effusion, s/p MV repair w trivial residual MR, asc aorta 39 mm, mod TR, severe LAE   Asthma    Cancer (HCC)    renal cyst   Chronic diastolic CHF (congestive heart failure) (HCC) 01/06/2017   Echo 7/16 [facility] in [place], [place]) Mild AI, mild LAE, mild concentric LVH, EF 55, normal wall motion, mild to moderate MR, mild PI, RVSP 55 // Echo 10/09/16 ([facility]):  Moderate LVH, grade 2 diastolic dysfunction, mild MR, moderate LAE    Dysrhythmia    paroxysmal afib/flutter   Esophagitis    ESRD (end stage renal disease) on dialysis [facility])    Dialysis Mon Wed Fri   GERD (gastroesophageal reflux disease)    Gout    no current problems   Hepatitis    Hep B   HIV (human immunodeficiency virus infection) (HCC)    HLD (hyperlipidemia)    Hypertension    Hypothyroidism    Mitral regurgitation    Neuropathy    Sleep apnea    uses c-pap   Wears glasses    Wears partial dentures     Past Surgical History:  Procedure Laterality Date   AV FISTULA PLACEMENT Left 07/02/2018   Procedure: Creation of Left arm BRACHIOBASILIC ARTERIOVENOUS  FISTULA;  Surgeon: Cephus Shelling, MD;  Location: [facility] OR;  Service: Vascular;  Laterality: Left;   BASCILIC  VEIN TRANSPOSITION Left 08/27/2018   Procedure: Left arm BRACHIOBASILIC VEIN TRANSPOSITION SECOND STAGE;  Surgeon: Cephus Shelling, MD;  Location: [facility] OR;  Service: Vascular;  Laterality: Left;   BUBBLE STUDY  09/03/2020   Procedure: BUBBLE STUDY;  Surgeon: Pricilla Riffle, MD;  Location: [facility] ENDOSCOPY;  Service: Cardiovascular;;   BUBBLE STUDY  06/03/2022   Procedure: BUBBLE STUDY;  Surgeon: Parke Poisson, MD;  Location: [facility] ENDOSCOPY;  Service: Cardiology;;   CAPD INSERTION N/A 05/30/2021   Procedure: LAPAROSCOPIC INSERTION CONTINUOUS AMBULATORY PERITONEAL DIALYSIS  (CAPD) CATHETER;  Surgeon: Leonie Douglas, MD;  Location: [facility] OR;  Service: Vascular;  Laterality: N/A;   CAPD REMOVAL N/A 02/13/2022   Procedure: PERITONEAL DIALYSIS CATHETER REMOVAL;  Surgeon: Leonie Douglas, MD;  Location: [facility] OR;  Service: Vascular;  Laterality: N/A;   CHOLECYSTECTOMY     COLONOSCOPY W/ BIOPSIES AND POLYPECTOMY     DRUG INDUCED ENDOSCOPY Bilateral 07/06/2023   Procedure: DRUG INDUCED SLEEP ENDOSCOPY;  Surgeon: Christia Reading, MD;  Location: [facility] OR;  Service: ENT;  Laterality: Bilateral;   GIVENS CAPSULE STUDY N/A 03/01/2018   Procedure: GIVENS CAPSULE STUDY;  Surgeon: Beverley Fiedler, MD;  Location: [facility] ENDOSCOPY;  Service: Gastroenterology;  Laterality: N/A;   HERNIA REPAIR  As baby   LEFT HEART CATH AND CORONARY ANGIOGRAPHY N/A 09/11/2022   Procedure: LEFT HEART CATH AND CORONARY ANGIOGRAPHY;  Surgeon: Corky Crafts, MD;  Location: [facility] INVASIVE CV LAB;  Service: Cardiovascular;  Laterality: N/A;   MITRAL VALVE REPAIR N/A 08/21/2022   Procedure: MINIMALLY INVASIVE MITRAL VALVE REPAIR USING RING ANNULOPLASTY SIMULUS 30mm;  Surgeon: Eugenio Hoes, MD;  Location: [facility] OR;  Service: Open Heart Surgery;  Laterality: N/A;   MULTIPLE TOOTH EXTRACTIONS     NEPHRECTOMY Left 02/18/2021   NOSE SURGERY     RENAL BIOPSY     RIGHT/LEFT HEART CATH AND CORONARY ANGIOGRAPHY N/A 09/04/2021   Procedure: RIGHT/LEFT HEART  CATH AND CORONARY ANGIOGRAPHY;  Surgeon: Kathleene Hazel, MD;  Location: [facility] INVASIVE CV LAB;  Service: Cardiovascular;  Laterality: N/A;   RIGHT/LEFT HEART CATH AND CORONARY ANGIOGRAPHY N/A 08/01/2022   Procedure: RIGHT/LEFT HEART CATH AND CORONARY ANGIOGRAPHY;  Surgeon: Tonny Bollman, MD;  Location: [facility] INVASIVE CV LAB;  Service: Cardiovascular;  Laterality: N/A;   TEE WITHOUT CARDIOVERSION N/A 09/03/2020   Procedure: TRANSESOPHAGEAL ECHOCARDIOGRAM (TEE);  Surgeon: Pricilla Riffle, MD;  Location: [facility] ENDOSCOPY;  Service: Cardiovascular;  Laterality: N/A;   TEE WITHOUT CARDIOVERSION N/A 06/03/2022   Procedure: TRANSESOPHAGEAL ECHOCARDIOGRAM (TEE);  Surgeon: Parke Poisson, MD;  Location: [facility] ENDOSCOPY;  Service: Cardiology;  Laterality: N/A;   TEE WITHOUT CARDIOVERSION N/A 08/21/2022   Procedure: TRANSESOPHAGEAL ECHOCARDIOGRAM (TEE);  Surgeon: Eugenio Hoes, MD;  Location: [facility] OR;  Service: Open Heart Surgery;  Laterality: N/A;   UPPER GI ENDOSCOPY  04/05/2021    Family History  Problem Relation Age of Onset   Hypertension Mother    Heart failure Mother    Hypertension Father    Alcohol abuse Father    Hypertension Sister    Multiple sclerosis Sister    Hypertension Sister    Hypertension Brother    Heart attack Maternal Grandmother 31   Scoliosis Other    Allergic rhinitis Neg Hx    Angioedema Neg Hx    Asthma Neg Hx    Eczema Neg Hx    Immunodeficiency Neg Hx    Urticaria Neg Hx    Colon cancer Neg Hx    Pancreatic cancer Neg Hx    Esophageal cancer Neg Hx     SOCIAL HISTORY: Social History   Socioeconomic History   Marital status: Single    Spouse name: Not on file   Number of children: 0   Years of education: Not on file   Highest education level: Not on file  Occupational History   Not on file  Tobacco Use   Smoking status: Former    Current packs/day: 0.00    Average packs/day: 1 pack/day for 18.0 years (18.0 ttl pk-yrs)    Types: Cigarettes    Start  date: 80    Quit date: 2000    Years since quitting: 25.2   Smokeless tobacco: Never  Vaping Use   Vaping status: Never Used  Substance and Sexual Activity   Alcohol use: Not Currently   Drug use: No   Sexual activity: Not Currently    Partners: Male  Other Topics Concern   Not on file  Social History Narrative   Disability      [place]: none   Social Drivers of Health   Financial Resource Strain: High Risk (07/13/2023)   Overall Financial Resource Strain (CARDIA)    Difficulty of Paying Living Expenses: Hard  Food Insecurity: Food Insecurity Present (07/13/2023)  Hunger Vital Sign    Worried About Running Out of Food in the Last Year: Often true    Ran Out of Food in the Last Year: Never true  Transportation Needs: No Transportation Needs (07/13/2023)   PRAPARE - Administrator, Civil Service (Medical): No    Lack of Transportation (Non-Medical): No  Physical Activity: Inactive (07/13/2023)   Exercise Vital Sign    Days of Exercise per Week: 0 days    Minutes of Exercise per Session: 0 min  Stress: No Stress Concern Present (07/13/2023)   [employer] of Occupational Health - Occupational Stress Questionnaire    Feeling of Stress : Not at all  Social Connections: Socially Isolated (07/13/2023)   Social Connection and Isolation Panel [NHANES]    Frequency of Communication with Friends and Family: More than three times a week    Frequency of Social Gatherings with Friends and Family: More than three times a week    Attends Religious Services: Never    Database administrator or Organizations: No    Attends Banker Meetings: Never    Marital Status: Never married  Intimate Partner Violence: Not At Risk (07/13/2023)   Humiliation, Afraid, Rape, and Kick questionnaire    Fear of Current or Ex-Partner: No    Emotionally Abused: No    Physically Abused: No    Sexually Abused: No    Allergies  Allergen Reactions   Ace Inhibitors Cough and Other  (See Comments)    Current Outpatient Medications  Medication Sig Dispense Refill   allopurinol (ZYLOPRIM) 100 MG tablet Take 1 tablet (100 mg total) by mouth daily. 90 tablet 0   amLODipine (NORVASC) 2.5 MG tablet Take 1 tablet (2.5 mg total) by mouth daily. 90 tablet 1   azelastine (ASTELIN) 0.1 % nasal spray Place 2 sprays into both nostrils 2 (two) times daily as needed for allergies. 30 mL 0   B Complex-C-Folic Acid (DIALYVITE 800) 0.8 MG TABS DIALYVITE 800+ZINC15MG  TB100     B Complex-C-Zn-Folic Acid (DIALYVITE 800-ZINC 15 PO) Take 1 tablet by mouth daily.     bictegravir-emtricitabine-tenofovir AF (BIKTARVY) 50-200-25 MG TABS tablet Take 1 tablet by mouth daily. 30 tablet 11   cetirizine (ZYRTEC) 10 MG tablet TAKE ONE TABLET BY MOUTH DAILY AT 9 a.m. 90 tablet 1   cinacalcet (SENSIPAR) 90 MG tablet Take 90 mg by mouth daily.     diclofenac Sodium (VOLTAREN) 1 % GEL Apply 2 g topically 4 (four) times daily. Apply to chest pain when lidocaine patch not in place (Patient taking differently: Apply 2 g topically 4 (four) times daily as needed (pain).) 150 g 0   ezetimibe (ZETIA) 10 MG tablet Take 1 tablet (10 mg total) by mouth daily. 90 tablet 1   famotidine (PEPCID) 20 MG tablet Take 1 tablet (20 mg total) by mouth 2 (two) times daily. 180 tablet 3   ferric citrate (AURYXIA) 1 GM 210 MG(Fe) tablet Take 420 mg by mouth See admin instructions. Take 420 mg by mouth three times a day with meals and 210 mg with each snack     fluticasone (FLOVENT HFA) 110 MCG/ACT inhaler Inhale 2 puffs into the lungs 2 (two) times daily as needed ("for flares").     gabapentin (NEURONTIN) 300 MG capsule Take 1 capsule (300 mg total) by mouth daily as needed (pain/neuropathy).     ipratropium (ATROVENT) 0.06 % nasal spray USE TWO SPRAYS IN EACH NOSTRIL 2-3 times daily AS NEEDED  15 mL 5   iron sucrose (VENOFER) 20 MG/ML injection Iron Sucrose (Venofer)     lactulose (CHRONULAC) 10 GM/15ML solution Take 45 mLs (30 g  total) by mouth daily as needed. Take 15-30 ml up to twice daily for constipation (Patient taking differently: Take 40 g by mouth daily as needed.) 1800 mL 11   levalbuterol (XOPENEX HFA) 45 MCG/ACT inhaler Inhale 2 puffs into the lungs every 4 (four) hours as needed for wheezing. 15 g 1   levothyroxine (SYNTHROID) 25 MCG tablet Take 1 tablet (25 mcg total) by mouth daily before breakfast. 90 tablet 1   lubiprostone (AMITIZA) 24 MCG capsule Take 1 capsule (24 mcg total) by mouth 2 (two) times daily with a meal. NEEDS APPT FOR FURTHER REFILLS 180 capsule 0   Methoxy PEG-Epoetin Beta (MIRCERA IJ) Mircera     metoprolol tartrate (LOPRESSOR) 25 MG tablet Take 1 tablet (25 mg total) by mouth 2 (two) times daily. 90 tablet 3   Olopatadine HCl (PATADAY) 0.7 % SOLN Place 1 drop into both eyes 2 (two) times daily as needed (allergies). 2.5 mL 5   pantoprazole (PROTONIX) 40 MG tablet Take 1 tablet (40 mg total) by mouth daily. 30 tablet 5   rOPINIRole (REQUIP) 1 MG tablet Take 1 tablet (1 mg total) by mouth at bedtime. 90 tablet 2   rosuvastatin (CRESTOR) 10 MG tablet Take 1 tablet (10 mg total) by mouth daily. 90 tablet 1   sevelamer carbonate (RENVELA) 800 MG tablet Take 2,400 mg by mouth 3 (three) times daily as needed (Phosphorus Levels).     Spacer/Aero-Holding Chambers DEVI 1 Device by Does not apply route 2 (two) times a day. 1 each 1   SYMBICORT 160-4.5 MCG/ACT inhaler Inhale 2 puffs into the lungs in the morning and at bedtime. (Patient taking differently: Inhale 2 puffs into the lungs 2 (two) times daily as needed (Asthma).) 10.2 g 5   tiZANidine (ZANAFLEX) 2 MG tablet Take 1 tablet (2 mg total) by mouth 2 (two) times daily as needed for muscle spasms. 30 tablet 0   Vitamin D, Ergocalciferol, (DRISDOL) 1.25 MG (50000 UNIT) CAPS capsule Take 1 capsule (50,000 Units total) by mouth every Tuesday. 12 capsule 0   warfarin (COUMADIN) 5 MG tablet Take 1 tablet to 2 tablets by mouth daily as prescribed by  coumadin clinic 140 tablet 0   amiodarone (PACERONE) 200 MG tablet Take 1 tablet (200 mg total) by mouth daily. (Patient not taking: Reported on 01/05/2024) 90 tablet 3   No current facility-administered medications for this visit.    REVIEW OF SYSTEMS:  [X]  denotes positive finding, [ ]  denotes negative finding Cardiac  Comments:  Chest pain or chest pressure:    Shortness of breath upon exertion:    Short of breath when lying flat:    Irregular heart rhythm:        Vascular    Pain in calf, thigh, or hip brought on by ambulation:    Pain in feet at night that wakes you up from your sleep:     Blood clot in your veins:    Leg swelling:         Pulmonary    Oxygen at home:    Productive cough:     Wheezing:         Neurologic    Sudden weakness in arms or legs:     Sudden numbness in arms or legs:     Sudden onset of difficulty speaking  or slurred speech:    Temporary loss of vision in one eye:     Problems with dizziness:         Gastrointestinal    Blood in stool:     Vomited blood:         Genitourinary    Burning when urinating:     Blood in urine:        Psychiatric    Major depression:         Hematologic    Bleeding problems:    Problems with blood clotting too easily:        Skin    Rashes or ulcers:        Constitutional    Fever or chills:      PHYSICAL EXAM: Vitals:   01/05/24 1515  BP: (!) 158/97  Pulse: 80  Resp: 20  Temp: 98.4 F (36.9 C)  TempSrc: Temporal  SpO2: 94%  Weight: 203 lb 14.4 oz (92.5 kg)  Height: 5\' 10"  (1.778 m)    GENERAL: The patient is a well-nourished male, in no acute distress. The vital signs are documented above. CARDIAC: There is a regular rate and rhythm.  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 NEUROLOGIC: No focal weakness or paresthesias are detected.  PSYCHIATRIC: The patient has a normal affect.    DATA:   N/a  Assessment/Plan:  58 y.o. male, with history of CHF, ESRD, HIV, hypertension, hyperlipidemia that presents for evaluation of a thinning area with scab over left arm brachiobasilic access.  Patient has a left basilic vein fistula placed in 2019.  Given the ulcer with thinning skin as pictured above I have recommended left arm AV fistula revision.  I discussed plicating the aneurysm and resecting the overlying ulcerated skin.  I discussed this will need at least 1 month to heal.  I suspect he will require tunneled dialysis catheter placement while this heals.  Risk and effects discussed.  Will need to hold his Coumadin.  Will get scheduled [facility].  All questions answered.  He understands this is to limit bleeding risk long-term from erosion of the ulcer into the fistula.   Cephus Shelling, MD [facility] Office: [phone]

## 2024-01-06 ENCOUNTER — Other Ambulatory Visit: Payer: Self-pay

## 2024-01-06 ENCOUNTER — Telehealth: Payer: Self-pay | Admitting: Nurse Practitioner

## 2024-01-06 DIAGNOSIS — D689 Coagulation defect, unspecified: Secondary | ICD-10-CM | POA: Diagnosis not present

## 2024-01-06 DIAGNOSIS — E876 Hypokalemia: Secondary | ICD-10-CM | POA: Diagnosis not present

## 2024-01-06 DIAGNOSIS — N2581 Secondary hyperparathyroidism of renal origin: Secondary | ICD-10-CM | POA: Diagnosis not present

## 2024-01-06 DIAGNOSIS — D509 Iron deficiency anemia, unspecified: Secondary | ICD-10-CM | POA: Diagnosis not present

## 2024-01-06 DIAGNOSIS — N186 End stage renal disease: Secondary | ICD-10-CM | POA: Diagnosis not present

## 2024-01-06 DIAGNOSIS — Z992 Dependence on renal dialysis: Secondary | ICD-10-CM

## 2024-01-06 DIAGNOSIS — R0782 Intercostal pain: Secondary | ICD-10-CM

## 2024-01-06 DIAGNOSIS — T8249XD Other complication of vascular dialysis catheter, subsequent encounter: Secondary | ICD-10-CM | POA: Diagnosis not present

## 2024-01-06 DIAGNOSIS — D631 Anemia in chronic kidney disease: Secondary | ICD-10-CM | POA: Diagnosis not present

## 2024-01-06 NOTE — Pre-Procedure Instructions (Signed)
 Surgical Instructions   Your procedure is scheduled on Sensing 20, 2025. Report to [facility] Main Entrance "A" at 12:45 P.M., then check in with the Admitting office. Any questions or running late day of surgery: call [phone]  Questions prior to your surgery date: call [phone], Monday-Friday, 8am-4pm. If you experience any cold or flu symptoms such as cough, fever, chills, shortness of breath, etc. between now and your scheduled surgery, please notify us at the above number.     Remember:  Do not eat after midnight the night before your surgery   You may drink clear liquids until 11:45 AM the morning of your surgery.   Clear liquids allowed are: Water, Non-Citrus Juices (without pulp), Carbonated Beverages, Clear Tea (no milk, honey, etc.), Black Coffee Only (NO MILK, CREAM OR POWDERED CREAMER of any kind), and Gatorade.    Take these medicines the morning of surgery with A SIP OF WATER: allopurinol (ZYLOPRIM)  amLODipine (NORVASC)  bictegravir-emtricitabine-tenofovir AF (BIKTARVY)  cetirizine (ZYRTEC)  cinacalcet (SENSIPAR)  ezetimibe (ZETIA)  famotidine (PEPCID)  levothyroxine (SYNTHROID)  metoprolol tartrate (LOPRESSOR)  pantoprazole (PROTONIX)  rosuvastatin (CRESTOR)    May take these medicines IF NEEDED: azelastine (ASTELIN) nasal spray  fluticasone (FLOVENT HFA) inhaler  gabapentin (NEURONTIN)  ipratropium (ATROVENT) nasal spray  levalbuterol (XOPENEX HFA) inhaler  SYMBICORT inhaler  tiZANidine (ZANAFLEX)    STOP taking your warfarin (COUMADIN) five days prior to surgery. Your last dose will be Jacquez 14th.   One week prior to surgery, STOP taking any Aspirin (unless otherwise instructed by your surgeon) Aleve, Naproxen, Ibuprofen, Motrin, Advil, Goody's, BC's, all herbal medications, fish oil, and non-prescription vitamins. This includes your medication: diclofenac Sodium (VOLTAREN) GEL                      Do NOT Smoke (Tobacco/Vaping) for 24 hours  prior to your procedure.  If you use a CPAP at night, you may bring your mask/headgear for your overnight stay.   You will be asked to remove any contacts, glasses, piercing's, hearing aid's, dentures/partials prior to surgery. Please bring cases for these items if needed.    Patients discharged the day of surgery will not be allowed to drive home, and someone needs to stay with them for 24 hours.  SURGICAL WAITING ROOM VISITATION Patients may have no more than 2 support people in the waiting area - these visitors may rotate.   Pre-op nurse will coordinate an appropriate time for 1 ADULT support person, who may not rotate, to accompany patient in pre-op.  Children under the age of 43 must have an adult with them who is not the patient and must remain in the main waiting area with an adult.  If the patient needs to stay at the hospital during part of their recovery, the visitor guidelines for inpatient rooms apply.  Please refer to the [facility] website for the visitor guidelines for any additional information.   If you received a COVID test during your pre-op visit  it is requested that you wear a mask when out in public, stay away from anyone that may not be feeling well and notify your surgeon if you develop symptoms. If you have been in contact with anyone that has tested positive in the last 10 days please notify you surgeon.      Pre-operative CHG Bathing Instructions   You can play a key role in reducing the risk of infection after surgery. Your skin needs to be as  free of germs as possible. You can reduce the number of germs on your skin by washing with CHG (chlorhexidine gluconate) soap before surgery. CHG is an antiseptic soap that kills germs and continues to kill germs even after washing.   DO NOT use if you have an allergy to chlorhexidine/CHG or antibacterial soaps. If your skin becomes reddened or irritated, stop using the CHG and notify one of our RNs at [phone].               TAKE A SHOWER THE NIGHT BEFORE SURGERY AND THE DAY OF SURGERY    Please keep in mind the following:  DO NOT shave, including legs and underarms, 48 hours prior to surgery.   You may shave your face before/day of surgery.  Place clean sheets on your bed the night before surgery Use a clean washcloth (not used since being washed) for each shower. DO NOT sleep with pet's night before surgery.  CHG Shower Instructions:  Wash your face and private area with normal soap. If you choose to wash your hair, wash first with your normal shampoo.  After you use shampoo/soap, rinse your hair and body thoroughly to remove shampoo/soap residue.  Turn the water OFF and apply half the bottle of CHG soap to a CLEAN washcloth.  Apply CHG soap ONLY FROM YOUR NECK DOWN TO YOUR TOES (washing for 3-5 minutes)  DO NOT use CHG soap on face, private areas, open wounds, or sores.  Pay special attention to the area where your surgery is being performed.  If you are having back surgery, having someone wash your back for you may be helpful. Wait 2 minutes after CHG soap is applied, then you may rinse off the CHG soap.  Pat dry with a clean towel  Put on clean pajamas    Additional instructions for the day of surgery: DO NOT APPLY any lotions, deodorants, cologne, or perfumes.   Do not wear jewelry or makeup Do not wear nail polish, gel polish, artificial nails, or any other type of covering on natural nails (fingers and toes) Do not bring valuables to the hospital. [facility] is not responsible for valuables/personal belongings. Put on clean/comfortable clothes.  Please brush your teeth.  Ask your nurse before applying any prescription medications to the skin.

## 2024-01-06 NOTE — Telephone Encounter (Signed)
-----   Message from [facility] T sent at 01/05/2024  4:06 PM EDT ----- Called pt.   Pt would like [facility] location and prefers Tuesday and Thursdays for appointment dates. (He is free all day tues and thurs)

## 2024-01-06 NOTE — Telephone Encounter (Signed)
Ct scan ordered.

## 2024-01-07 ENCOUNTER — Other Ambulatory Visit: Payer: Self-pay

## 2024-01-07 ENCOUNTER — Telehealth: Payer: Self-pay | Admitting: Pharmacist

## 2024-01-07 ENCOUNTER — Encounter (HOSPITAL_COMMUNITY)
Admission: RE | Admit: 2024-01-07 | Discharge: 2024-01-07 | Disposition: A | Discharge status: Home / Self Care | Payer: 59 | Source: Ambulatory Visit | Attending: Otolaryngology | Admitting: Otolaryngology

## 2024-01-07 ENCOUNTER — Encounter (HOSPITAL_COMMUNITY): Payer: Self-pay

## 2024-01-07 ENCOUNTER — Ambulatory Visit: Attending: Cardiovascular Disease | Admitting: *Deleted

## 2024-01-07 VITALS — BP 140/97 | HR 79 | Temp 98.3°F | Resp 18 | Ht 70.0 in | Wt 198.0 lb

## 2024-01-07 DIAGNOSIS — I2510 Atherosclerotic heart disease of native coronary artery without angina pectoris: Secondary | ICD-10-CM | POA: Insufficient documentation

## 2024-01-07 DIAGNOSIS — Z79624 Long term (current) use of inhibitors of nucleotide synthesis: Secondary | ICD-10-CM | POA: Diagnosis not present

## 2024-01-07 DIAGNOSIS — I132 Hypertensive heart and chronic kidney disease with heart failure and with stage 5 chronic kidney disease, or end stage renal disease: Secondary | ICD-10-CM | POA: Diagnosis not present

## 2024-01-07 DIAGNOSIS — Z87891 Personal history of nicotine dependence: Secondary | ICD-10-CM | POA: Insufficient documentation

## 2024-01-07 DIAGNOSIS — G629 Polyneuropathy, unspecified: Secondary | ICD-10-CM | POA: Insufficient documentation

## 2024-01-07 DIAGNOSIS — B20 Human immunodeficiency virus [HIV] disease: Secondary | ICD-10-CM | POA: Diagnosis not present

## 2024-01-07 DIAGNOSIS — I5032 Chronic diastolic (congestive) heart failure: Secondary | ICD-10-CM | POA: Insufficient documentation

## 2024-01-07 DIAGNOSIS — E785 Hyperlipidemia, unspecified: Secondary | ICD-10-CM | POA: Insufficient documentation

## 2024-01-07 DIAGNOSIS — Z7901 Long term (current) use of anticoagulants: Secondary | ICD-10-CM

## 2024-01-07 DIAGNOSIS — G4733 Obstructive sleep apnea (adult) (pediatric): Secondary | ICD-10-CM | POA: Insufficient documentation

## 2024-01-07 DIAGNOSIS — Z8674 Personal history of sudden cardiac arrest: Secondary | ICD-10-CM | POA: Diagnosis not present

## 2024-01-07 DIAGNOSIS — B181 Chronic viral hepatitis B without delta-agent: Secondary | ICD-10-CM | POA: Diagnosis not present

## 2024-01-07 DIAGNOSIS — I480 Paroxysmal atrial fibrillation: Secondary | ICD-10-CM

## 2024-01-07 DIAGNOSIS — Z85528 Personal history of other malignant neoplasm of kidney: Secondary | ICD-10-CM | POA: Insufficient documentation

## 2024-01-07 DIAGNOSIS — Z992 Dependence on renal dialysis: Secondary | ICD-10-CM | POA: Insufficient documentation

## 2024-01-07 DIAGNOSIS — R0600 Dyspnea, unspecified: Secondary | ICD-10-CM | POA: Diagnosis not present

## 2024-01-07 DIAGNOSIS — Z9889 Other specified postprocedural states: Secondary | ICD-10-CM

## 2024-01-07 DIAGNOSIS — Z5181 Encounter for therapeutic drug level monitoring: Secondary | ICD-10-CM | POA: Diagnosis not present

## 2024-01-07 DIAGNOSIS — D631 Anemia in chronic kidney disease: Secondary | ICD-10-CM | POA: Insufficient documentation

## 2024-01-07 DIAGNOSIS — Z01812 Encounter for preprocedural laboratory examination: Secondary | ICD-10-CM | POA: Insufficient documentation

## 2024-01-07 DIAGNOSIS — E039 Hypothyroidism, unspecified: Secondary | ICD-10-CM | POA: Insufficient documentation

## 2024-01-07 DIAGNOSIS — Z01818 Encounter for other preprocedural examination: Secondary | ICD-10-CM

## 2024-01-07 DIAGNOSIS — I4892 Unspecified atrial flutter: Secondary | ICD-10-CM | POA: Insufficient documentation

## 2024-01-07 DIAGNOSIS — Z905 Acquired absence of kidney: Secondary | ICD-10-CM | POA: Diagnosis not present

## 2024-01-07 DIAGNOSIS — K219 Gastro-esophageal reflux disease without esophagitis: Secondary | ICD-10-CM | POA: Insufficient documentation

## 2024-01-07 DIAGNOSIS — I252 Old myocardial infarction: Secondary | ICD-10-CM | POA: Diagnosis not present

## 2024-01-07 DIAGNOSIS — N186 End stage renal disease: Secondary | ICD-10-CM | POA: Insufficient documentation

## 2024-01-07 DIAGNOSIS — Z79899 Other long term (current) drug therapy: Secondary | ICD-10-CM | POA: Insufficient documentation

## 2024-01-07 HISTORY — DX: Cerebral infarction, unspecified: I63.9

## 2024-01-07 HISTORY — DX: Acute myocardial infarction, unspecified: I21.9

## 2024-01-07 LAB — COMPREHENSIVE METABOLIC PANEL
ALT: 15 U/L (ref 0–44)
AST: 20 U/L (ref 15–41)
Albumin: 4.1 g/dL (ref 3.5–5.0)
Alkaline Phosphatase: 110 U/L (ref 38–126)
Anion gap: 13 (ref 5–15)
BUN: 34 mg/dL — ABNORMAL HIGH (ref 6–20)
CO2: 27 mmol/L (ref 22–32)
Calcium: 9.5 mg/dL (ref 8.9–10.3)
Chloride: 93 mmol/L — ABNORMAL LOW (ref 98–111)
Creatinine, Ser: 8.34 mg/dL — ABNORMAL HIGH (ref 0.61–1.24)
GFR, Estimated: 7 mL/min — ABNORMAL LOW (ref >60)
Glucose, Bld: 100 mg/dL — ABNORMAL HIGH (ref 70–99)
Potassium: 3.6 mmol/L (ref 3.5–5.1)
Sodium: 133 mmol/L — ABNORMAL LOW (ref 135–145)
Total Bilirubin: 0.9 mg/dL (ref 0.0–1.2)
Total Protein: 8.2 g/dL — ABNORMAL HIGH (ref 6.5–8.1)

## 2024-01-07 LAB — CBC
HCT: 33.8 % — ABNORMAL LOW (ref 39.0–52.0)
Hemoglobin: 11.8 g/dL — ABNORMAL LOW (ref 13.0–17.0)
MCH: 33.1 pg (ref 26.0–34.0)
MCHC: 34.9 g/dL (ref 30.0–36.0)
MCV: 94.9 fL (ref 80.0–100.0)
Platelets: 236 10*3/uL (ref 150–400)
RBC: 3.56 MIL/uL — ABNORMAL LOW (ref 4.22–5.81)
RDW: 15.1 % (ref 11.5–15.5)
WBC: 4.6 10*3/uL (ref 4.0–10.5)
nRBC: 0 % (ref 0.0–0.2)

## 2024-01-07 LAB — POCT INR: POC INR: 2.2

## 2024-01-07 NOTE — Patient Instructions (Addendum)
 Description   Continue taking 2 tablets daily except 1 tablet every Monday, Wednesday and Friday.  Hold Warfarin 3/15-3/19. Resume Normal dose of warfarin on 3/20 (after the procedure) unless Dr states otherwise.   Recheck INR 1 week post procedure.  Coumadin Clinic [phone]

## 2024-01-07 NOTE — Telephone Encounter (Signed)
 Patient has been cleared for procedure on 3/20. At Coumadin appt today patient told RN he has another procedure on 3/26. Can we see if patient needs to hold warfarin for this also?

## 2024-01-07 NOTE — Progress Notes (Signed)
 Pt is having left arm AV fistula revision and tunnelled dialysis catheter placement with Dr. Chestine Spore on 3/19. I had pt sign both consents at this appt. If he is called for SDW, pt just needs to be reminded of arrival time on 3/19. I did tell him this today and also told him he will be NPO for surgery that day. I will call both offices to make sure they are aware that these surgeries are one day apart.

## 2024-01-07 NOTE — Progress Notes (Addendum)
 PCP - Mordecai Maes, NP Cardiologist - Dr. Tonny Bollman EP- Dr. Steffanie Dunn  PPM/ICD - denies   Chest x-ray - 12/31/23 EKG - 12/31/23 Stress Test - 10/01/23- CE ECHO - 10/01/23- [facility] Cardiac Cath - 09/11/22  Sleep Study - OSA+ CPAP - denies  DM- denies  Last dose of GLP1 agonist-  n/a   Blood Thinner Instructions: Stop taking Warfarin on 3/14 Aspirin Instructions: n/a  ERAS Protcol - clears until 1145   COVID TEST- n/a   Anesthesia review: yes, cardiac hx. Pt is having left arm AV fistula revision and tunnelled dialysis catheter placement with Dr. Chestine Spore on 3/19. I had pt sign both consents at this appt. If he is called for SDW, pt just needs to be reminded of arrival time on 3/19. I did tell him this today and also told him he will be NPO for surgery that day.   Patient denies shortness of breath, fever, cough and chest pain at PAT appointment   All instructions explained to the patient, with a verbal understanding of the material. Patient agrees to go over the instructions while at home for a better understanding.  The opportunity to ask questions was provided.

## 2024-01-08 ENCOUNTER — Other Ambulatory Visit: Payer: Self-pay | Admitting: Allergy & Immunology

## 2024-01-08 DIAGNOSIS — E876 Hypokalemia: Secondary | ICD-10-CM | POA: Diagnosis not present

## 2024-01-08 DIAGNOSIS — D509 Iron deficiency anemia, unspecified: Secondary | ICD-10-CM | POA: Diagnosis not present

## 2024-01-08 DIAGNOSIS — D631 Anemia in chronic kidney disease: Secondary | ICD-10-CM | POA: Diagnosis not present

## 2024-01-08 DIAGNOSIS — N2581 Secondary hyperparathyroidism of renal origin: Secondary | ICD-10-CM | POA: Diagnosis not present

## 2024-01-08 DIAGNOSIS — Z992 Dependence on renal dialysis: Secondary | ICD-10-CM | POA: Diagnosis not present

## 2024-01-08 DIAGNOSIS — N186 End stage renal disease: Secondary | ICD-10-CM | POA: Diagnosis not present

## 2024-01-08 DIAGNOSIS — D689 Coagulation defect, unspecified: Secondary | ICD-10-CM | POA: Diagnosis not present

## 2024-01-08 DIAGNOSIS — T8249XD Other complication of vascular dialysis catheter, subsequent encounter: Secondary | ICD-10-CM | POA: Diagnosis not present

## 2024-01-08 NOTE — Telephone Encounter (Signed)
 Patient with diagnosis of atrial fibrillation on warfarin for anticoagulation.    Procedure:   AV FISTULA AND INSERTION OF TUNNEL DIALYSIS CATHETER    Date of Surgery:  Clearance 01/20/24   CHA2DS2-VASc Score = 5   This indicates a 7.2% annual risk of stroke. 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   Patient will not need bridging with Lovenox (enoxaparin) around procedure.  **This guidance is not considered finalized until pre-operative APP has relayed final recommendations.**

## 2024-01-08 NOTE — Telephone Encounter (Signed)
   Pre-operative Risk Assessment    Patient Name: Albert Eaton  DOB: 09/29/1966 MRN: [id number]   Date of last office visit: 10/29/23 DR. COOPER Date of next office visit: NONE   Request for Surgical Clearance    Procedure:   AV FISTULA AND INSERTION OF TUNNEL DIALYSIS CATHETER   Date of Surgery:  Clearance 01/20/24                                Surgeon:  DR. Chestine Spore Surgeon's Group or Practice Name:  [facility] Phone number:  [phone] Fax number:  [phone]   Type of Clearance Requested:   - Medical  - Pharmacy:  Hold Warfarin (Coumadin)     Type of Anesthesia:   CHOICE   Additional requests/questions:    Elpidio Anis   01/08/2024, 2:34 PM

## 2024-01-08 NOTE — Progress Notes (Signed)
 Anesthesia Chart Review:  Case: [id number] Date/Time: 01/14/24 1430   Procedure: RIGHT IMPLANTATION OF HYPOGLOSSAL NERVE STIMULATOR (Right)   Anesthesia type: General   Pre-op diagnosis: Obstructive sleep apnea   [facility] 09 / [facility]   Surgeons: Christia Reading, MD       DISCUSSION: Patient is a 58 year old male scheduled for the above procedure. S/p drug induced sleep endoscopy on 07/06/23. Hypoglossal nerve stimulator was initially scheduled for 12/30/23, but postponed per anesthesia until at least 4 weeks post 12/14/23 ED visit for N/V/abd pain and cough (see below). He is also scheduled for revision of his left basilic vein AVF due to aneurysmal segment on 01/20/24 by Sherald Hess, MD. (Both surgeries could not be done on the same day due to scheduling conflicts.)  History includes former smoker (quit 10/27/98), HTN, HLD, ESRD (PD catheter 05/30/21-02/13/22; HD [place] [facility] MWF, LUE AVF), renal cell cancer (s/p left nephrectomy 02/18/21), CAD (mild-moderate, nonobstructive 08/2022), chronic diastolic CHF, severe MR (s/p right mini-thoracotomy, MV repair using 30 mm annuloplasty ing 08/21/22; small left frontal lobe infarct on post-op CT), paroxysmal afib/aflutter (PAF 12/02/21; 2% aflutter burden 04/2023 monitor), HIV & chronic hepatitis B (treated with Biktarvy), anemia, GERD, hypothyroidism, neuropathy, OSA (intolerant to CPAP), dilated thoracic aorta (3.4 cm thoracic arch 03/13/23 CTA). S/p cardiac arrest 09/08/22 during hemodialysis, s/p epinephrine x2 and CPR, and calcium gluconate for PEA (SVT versus afib) with stable coronary anatomy by LHC.    ED visit on 12/14/23 for N/V x 2 months, RLQ pain x 1 week with decreased appetite. He also reported a productive cough x 2 weeks. No chest pain. Lungs sounds clear. CT abd/pelvis showed findings suggestive of infectious cystitis and ascending UTI. CXR showed trace bilateral pleural effusions and possible early right basilar infiltrate. He was treated for  UTI and "possible" pneumonia with renally dosed cefpodoxime. Follow-up CXR with primary care on 12/31/23 showed lungs clear, no opacities, pleural effusions, no acute process.   He is followed by primary cardiologist Dr. Excell Seltzer and EP Dr. Lalla Brothers. He had mild-moderate CAD by 08/2022 LHC. He underwent MV repair (annuloplasty ring) on 08/21/22 via right thoracotomy approach. He had 2% aflutter burden in July 2024 and has placed on anticoagulation therapy. Limited TTE 01/2023 showed MV repair with trivial MR, moderate TR, no pericardial effusion. EF in 08/2022 was 45-50%. His last cardiology visit with Dr. Excell Seltzer was on 10/29/23. He was doing well fro ma cardiac standpoint. Reported listed for renal transplant at both [facility] and [facility] and preferred that he be transitioned from apixaban to warfarin so they could reverse anticoagulation therapy if needed for surgery. He denied chest pain, SOB, edema, orthopnea, PND. He continued to get occasional palpations, but no increase in frequency. Volume status managed with dialysis. One year cardiology follow-up recommended.  Dr. Excell Seltzer added the following addendum: "Addendum 11/16/2023: Received request for preoperative clearance for implantation of a sleep device.  As outlined above, he was having no symptoms of angina and no active heart failure symptoms.  Volume management via hemodialysis noted.  I think he can proceed with surgery without further cardiovascular testing at low risk of cardiovascular complications.  He can clearly achieve greater than 4 metabolic equivalents without associated exertional symptoms.  He will hold warfarin before surgery per protocol."  As above, know paroxysmal aflutter (2% burden).  He did have to take an additional dose of metoprolol for aflutter at 121 bpm on 12/31/23 at PCP office. HR at PAT visit was 79 bpm.  PT/INR 2.2 on 01/07/24.Last warfarin dose is planned for 01/08/24.   Anesthesia team to evaluate on the day of surgery. He will need  labs on arrival given ESRD and also PT/INR.    VS: BP (!) 140/97   Pulse 79   Temp 36.8 C (Oral)   Resp 18   Ht 5\' 10"  (1.778 m)   Wt 89.8 kg   SpO2 100%   BMI 28.41 kg/m    PROVIDERS: [name], NP is PCP  Tonny Bollman, MD is primary cardiologist Steffanie Dunn, MD is EP  Eugenio Hoes, MD is CT surgeon Erick Blinks, MD is GI Merwyn Katos, DO is ID Malachi Bonds, MD is allergist Virl Diamond, MD is pulmonologist   LABS: Preoperative labs noted from 01/07/24. He will need iSTAT on the day of surgery given ESRD and PT/INR since he is on warfarin.  (all labs ordered are listed, but only abnormal results are displayed)  Labs Reviewed  COMPREHENSIVE METABOLIC PANEL - Abnormal; Notable for the following components:      Result Value   Sodium 133 (*)    Chloride 93 (*)    Glucose, Bld 100 (*)    BUN 34 (*)    Creatinine, Ser 8.34 (*)    Total Protein 8.2 (*)    GFR, Estimated 7 (*)    All other components within normal limits  CBC - Abnormal; Notable for the following components:   RBC 3.56 (*)    Hemoglobin 11.8 (*)    HCT 33.8 (*)    All other components within normal limits     IMAGES: CXR 12/31/23: FINDINGS: - Heart size is within normal limits. Previous mitral valve replacement noted. Stable right paratracheal soft tissue prominence, consistent with ectasia great vessels as seen on previous chest CTA. - Both lungs are clear. Previously seen opacity in the right lung base has resolved. No pleural effusion. IMPRESSION: No active cardiopulmonary disease.  CT Abd/pelvis 12/14/23: IMPRESSION: 1. Mild perivesicular inflammatory stranding. Extensive perinephric stranding, slightly progressive since prior examination. Together, the findings may reflect changes of an infectious cystitis and ascending urinary tract infection/pyelonephritis. Correlation with urinalysis and urine culture may be helpful. 2. Small left pleural effusion, slightly  increased since prior examination. 3. Mild global cardiomegaly. - Aortic Atherosclerosis (ICD10-I70.0).  CTA Chest 03/13/23: IMPRESSION: 1. No evidence of pulmonary embolism or other acute intrathoracic process. 2. Unable to assess for the possibility of aortic dissection on this exam timed for assessment of the pulmonary arterial tree. 3. Cardiomegaly with mildly dilated central pulmonary vasculature, suggestive of pulmonary arterial hypertension. 4. 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 Resorptive changes at the fracture margins with adjacent soft tissue thickening/fibrosis suggesting a pseudoarthrosis. 5. Healed fractures of multiple bilateral anterior ribs. 6. Thoracic aortic arch measures 3.4 cm in diameter. Recommend annual imaging followup by CTA or MRA. This recommendation follows 2010 ACCF/AHA/AATS/ACR/ASA/SCA/SCAI/SIR/STS/SVM Guidelines for the Diagnosis and Management of Patients with Thoracic Aortic Disease. Circulation.2010; 121: Z610-R604. Aortic aneurysm NOS (ICD10-I71.9) 7. Aortic and coronary artery atherosclerosis (ICD10-I70.0).      EKG:  EKG 12/31/23: Atrial flutter at 121 bpm -Inferior ST-elevation -repolarization variant. ABNORMAL RHYTHM  EKG 12/14/23: Sinus rhythm Left atrial enlargement Left ventricular hypertrophy Prolonged QT interval Confirmed by Alvester Chou [phone]) on 12/14/2023 10:04:01 PM    CV: Long Term Zio Monitor 05/10/23 - 05/24/23: Patient had a min HR of 53 bpm, max HR of 193 bpm, and avg HR of 81 bpm. Predominant underlying rhythm was Sinus Rhythm.  1 run of Ventricular Tachycardia occurred lasting 4 beats with a max rate of 193 bpm (avg 182 bpm). Atrial Flutter occurred (2%  burden), ranging from 96-177 bpm (avg of 132 bpm), the longest lasting 33 mins 59 secs with an avg rate of 137 bpm. Atrial Flutter may be possible Atrial Tachycardia with variable block. Isolated SVEs were occasional (3.5%, 57397), SVE Couplets were   occasional (1.0%, 8279), and SVE Triplets were rare (<1.0%, 1481). Isolated VEs were occasional (2.4%, 38757), VE Couplets were rare (<1.0%, 2424), and VE Triplets were rare (<1.0%, 27). Ventricular Bigeminy and Trigeminy were present. SUMMARY: Normal sinus with average HR of 81 bpm. Atrial flutter occurs with a 2% burden, average heart rate during atrial flutter is 137 bpm. Occasional PVC's with a burden of 2.4%.      Echo (Limited) 02/17/23: IMPRESSIONS   1. No pericardial effusion.   2. Left atrial size was severely dilated.   3. Post mitral repair with trivial residual MR . The mitral valve has  been repaired/replaced. There is a 30 mm [employer] prosthetic annuloplasty  ring. present in the mitral position. Procedure Date: 07/2022.   4. Tricuspid valve regurgitation is moderate.   5. The aortic valve is tricuspid. Aortic valve regurgitation is not  visualized. No aortic stenosis is present.   6. Aortic dilatation noted. There is mild dilatation of the ascending  aorta, measuring 39 mm.  - Comparison: Limited TTE 09/09/22: LVEF 45-50%, LV global hypokinesis, moderate concentric LVH, moderate RVH, MV repair with trivial MR; Complete TTE 08/22/22: LVEF 45-50%, LV global hypokinesis, mild LVH, grade 2 diastolic dysfunction, interventricular septum flattening consistent with RV pressure and volume overload, mildly reduced RV systolic function, moderate RVH, moderately elevated PASP, estimated RVSP 50 mmHg, severely dilated LA/RA, MV repair, severe TR; TEE 06/03/22 negative bubble study for interatrial shunt    LHC 09/11/22:   Mid LAD lesion is 50% stenosed.   Prox LAD lesion is 50% stenosed.   Prox RCA lesion is 40% stenosed.   2nd Mrg lesion is 30% stenosed.   LV end diastolic pressure is mildly elevated.   There is no aortic valve stenosis. No change in coronary anatomy since October 2023.  Consider EP referral for consideration of AICD.       US Carotid 08/19/22: Summary:  - Right  Carotid: The extracranial vessels were near-normal with only minimal  wall thickening or plaque.  - Left Carotid: The extracranial vessels were near-normal with only minimal  wall thickening or plaque.  - Vertebrals: Bilateral vertebral arteries demonstrate antegrade flow.      RHC/LHC 08/01/22: 1.  Mildly elevated pulmonary wedge pressure and PA pressure with mean wedge pressure of 17 mmHg and mean PA pressure of 25 mmHg, high cardiac output likely related to Fick measurement inpatient with AV fistula 2.  Stable moderate nonobstructive coronary artery disease with patency of the left main, moderate 50% proximal LAD stenosis unchanged from the previous study, mild nonobstructive left circumflex stenosis (dominant vessel), and mild nonobstructive stenosis of a nondominant RCA Recommend: Continue evaluation per Dr. Leafy Ro with plans for surgical valve repair.     Past Medical History:  Diagnosis Date   Anemia    Aortic atherosclerosis (HCC)    Arthritis    Ascending aorta dilation (HCC) 02/17/2023   Limited TTE 02/17/23: no effusion, s/p MV repair w trivial residual MR, asc aorta 39 mm, mod TR, severe LAE   Asthma    Cancer (HCC)    renal cyst  Chronic diastolic CHF (congestive heart failure) (HCC) 01/06/2017   Echo 7/16 [facility] in [place], [place]) Mild AI, mild LAE, mild concentric LVH, EF 55, normal wall motion, mild to moderate MR, mild PI, RVSP 55 // Echo 10/09/16 ([facility]):  Moderate LVH, grade 2 diastolic dysfunction, mild MR, moderate LAE    Dysrhythmia    paroxysmal afib/flutter   Esophagitis    ESRD (end stage renal disease) on dialysis [facility])    Dialysis Mon Wed Fri   GERD (gastroesophageal reflux disease)    Gout    no current problems   Heart murmur    "had it since he was a baby"- pt had echo 10/01/23   Hepatitis    Hep B   HIV (human immunodeficiency virus infection) (HCC)    HLD (hyperlipidemia)    Hypertension    Hypothyroidism    Mitral regurgitation     Myocardial infarction (HCC)    Neuropathy    Sleep apnea    does not use c-pap   Stroke [name])    Wears glasses    Wears partial dentures     Past Surgical History:  Procedure Laterality Date   AV FISTULA PLACEMENT Left 07/02/2018   Procedure: Creation of Left arm BRACHIOBASILIC ARTERIOVENOUS  FISTULA;  Surgeon: Cephus Shelling, MD;  Location: [facility] OR;  Service: Vascular;  Laterality: Left;   BASCILIC VEIN TRANSPOSITION Left 08/27/2018   Procedure: Left arm BRACHIOBASILIC VEIN TRANSPOSITION SECOND STAGE;  Surgeon: Cephus Shelling, MD;  Location: [facility] OR;  Service: Vascular;  Laterality: Left;   BUBBLE STUDY  09/03/2020   Procedure: BUBBLE STUDY;  Surgeon: Pricilla Riffle, MD;  Location: [facility] ENDOSCOPY;  Service: Cardiovascular;;   BUBBLE STUDY  06/03/2022   Procedure: BUBBLE STUDY;  Surgeon: Parke Poisson, MD;  Location: [facility] ENDOSCOPY;  Service: Cardiology;;   CAPD INSERTION N/A 05/30/2021   Procedure: LAPAROSCOPIC INSERTION CONTINUOUS AMBULATORY PERITONEAL DIALYSIS  (CAPD) CATHETER;  Surgeon: Leonie Douglas, MD;  Location: [facility] OR;  Service: Vascular;  Laterality: N/A;   CAPD REMOVAL N/A 02/13/2022   Procedure: PERITONEAL DIALYSIS CATHETER REMOVAL;  Surgeon: Leonie Douglas, MD;  Location: [facility] OR;  Service: Vascular;  Laterality: N/A;   CHOLECYSTECTOMY     COLONOSCOPY W/ BIOPSIES AND POLYPECTOMY     DRUG INDUCED ENDOSCOPY Bilateral 07/06/2023   Procedure: DRUG INDUCED SLEEP ENDOSCOPY;  Surgeon: Christia Reading, MD;  Location: [facility] OR;  Service: ENT;  Laterality: Bilateral;   GIVENS CAPSULE STUDY N/A 03/01/2018   Procedure: GIVENS CAPSULE STUDY;  Surgeon: Beverley Fiedler, MD;  Location: [facility] ENDOSCOPY;  Service: Gastroenterology;  Laterality: N/A;   HERNIA REPAIR     As baby   LEFT HEART CATH AND CORONARY ANGIOGRAPHY N/A 09/11/2022   Procedure: LEFT HEART CATH AND CORONARY ANGIOGRAPHY;  Surgeon: Corky Crafts, MD;  Location: [facility] INVASIVE CV LAB;  Service: Cardiovascular;  Laterality: N/A;    MITRAL VALVE REPAIR N/A 08/21/2022   Procedure: MINIMALLY INVASIVE MITRAL VALVE REPAIR USING RING ANNULOPLASTY SIMULUS 30mm;  Surgeon: Eugenio Hoes, MD;  Location: [facility] OR;  Service: Open Heart Surgery;  Laterality: N/A;   MULTIPLE TOOTH EXTRACTIONS     NEPHRECTOMY Left 02/18/2021   NOSE SURGERY     RENAL BIOPSY     RIGHT/LEFT HEART CATH AND CORONARY ANGIOGRAPHY N/A 09/04/2021   Procedure: RIGHT/LEFT HEART CATH AND CORONARY ANGIOGRAPHY;  Surgeon: Kathleene Hazel, MD;  Location: [facility] INVASIVE CV LAB;  Service: Cardiovascular;  Laterality: N/A;   RIGHT/LEFT HEART CATH  AND CORONARY ANGIOGRAPHY N/A 08/01/2022   Procedure: RIGHT/LEFT HEART CATH AND CORONARY ANGIOGRAPHY;  Surgeon: Tonny Bollman, MD;  Location: [facility] INVASIVE CV LAB;  Service: Cardiovascular;  Laterality: N/A;   TEE WITHOUT CARDIOVERSION N/A 09/03/2020   Procedure: TRANSESOPHAGEAL ECHOCARDIOGRAM (TEE);  Surgeon: Pricilla Riffle, MD;  Location: [facility] ENDOSCOPY;  Service: Cardiovascular;  Laterality: N/A;   TEE WITHOUT CARDIOVERSION N/A 06/03/2022   Procedure: TRANSESOPHAGEAL ECHOCARDIOGRAM (TEE);  Surgeon: Parke Poisson, MD;  Location: [facility] ENDOSCOPY;  Service: Cardiology;  Laterality: N/A;   TEE WITHOUT CARDIOVERSION N/A 08/21/2022   Procedure: TRANSESOPHAGEAL ECHOCARDIOGRAM (TEE);  Surgeon: Eugenio Hoes, MD;  Location: [facility] OR;  Service: Open Heart Surgery;  Laterality: N/A;   UPPER GI ENDOSCOPY  04/05/2021    MEDICATIONS: No current facility-administered medications for this encounter.    allopurinol (ZYLOPRIM) 100 MG tablet   amLODipine (NORVASC) 2.5 MG tablet   azelastine (ASTELIN) 0.1 % nasal spray   bictegravir-emtricitabine-tenofovir AF (BIKTARVY) 50-200-25 MG TABS tablet   cinacalcet (SENSIPAR) 90 MG tablet   diclofenac Sodium (VOLTAREN) 1 % GEL   famotidine (PEPCID) 20 MG tablet   ferric citrate (AURYXIA) 1 GM 210 MG(Fe) tablet   fluticasone (FLOVENT HFA) 110 MCG/ACT inhaler   gabapentin (NEURONTIN) 300 MG capsule    ipratropium (ATROVENT) 0.06 % nasal spray   lactulose (CHRONULAC) 10 GM/15ML solution   levalbuterol (XOPENEX HFA) 45 MCG/ACT inhaler   levothyroxine (SYNTHROID) 25 MCG tablet   lubiprostone (AMITIZA) 24 MCG capsule   metoprolol tartrate (LOPRESSOR) 25 MG tablet   Olopatadine HCl (PATADAY) 0.7 % SOLN   pantoprazole (PROTONIX) 40 MG tablet   rOPINIRole (REQUIP) 1 MG tablet   rosuvastatin (CRESTOR) 10 MG tablet   sevelamer carbonate (RENVELA) 800 MG tablet   SYMBICORT 160-4.5 MCG/ACT inhaler   tiZANidine (ZANAFLEX) 2 MG tablet   Vitamin D, Ergocalciferol, (DRISDOL) 1.25 MG (50000 UNIT) CAPS capsule   B Complex-C-Zn-Folic Acid (DIALYVITE 800/ZINC PO)   cetirizine (ZYRTEC) 10 MG tablet   iron sucrose (VENOFER) 20 MG/ML injection   Methoxy PEG-Epoetin Beta (MIRCERA IJ)   Spacer/Aero-Holding Chambers DEVI   warfarin (COUMADIN) 5 MG tablet    Shonna Chock, PA-C Surgical Short Stay/Anesthesiology [facility] Phone [phone] [facility] Phone [phone] 01/08/2024 6:26 PM

## 2024-01-08 NOTE — Progress Notes (Signed)
 Anesthesia Chart Review:  Case: [id number] Date/Time: 01/20/24 0715   Procedures:      REVISON OF ARTERIOVENOUS FISTULA (Left)     INSERTION OF DIALYSIS CATHETER   Anesthesia type: Choice   Diagnosis: End stage renal disease (HCC) [N18.6]   Pre-op diagnosis: ESRD   [facility] 11 / [facility]   Surgeons: Cephus Shelling, MD       DISCUSSION: Patient is a 58 year old male scheduled for the above procedure. S/p drug induced sleep endoscopy on 07/06/23.   History includes former smoker (quit 10/27/98), HTN, HLD, ESRD (PD catheter 05/30/21-02/13/22; HD [place] [facility] MWF, LUE AVF), renal cell cancer (s/p left nephrectomy 02/18/21), CAD (mild-moderate, nonobstructive 08/2022), chronic diastolic CHF, severe MR (s/p right mini-thoracotomy, MV repair using 30 mm annuloplasty ing 08/21/22; small left frontal lobe infarct on post-op CT), paroxysmal afib/aflutter (PAF 12/02/21; 2% aflutter burden 04/2023 monitor), HIV & chronic hepatitis B (treated with Biktarvy), anemia, GERD, hypothyroidism, neuropathy, OSA (intolerant to CPAP), dilated thoracic aorta (3.4 cm thoracic arch 03/13/23 CTA). S/p cardiac arrest 09/08/22 during hemodialysis, s/p epinephrine x2 and CPR, and calcium gluconate for PEA (SVT versus afib) with stable coronary anatomy by LHC.    He is followed by primary cardiologist Dr. Excell Seltzer and EP Dr. Lalla Brothers. He had mild-moderate CAD by 08/2022 LHC. He underwent MV repair (annuloplasty ring) on 08/21/22 via right thoracotomy approach. He had 2% aflutter burden in July 2024 and has placed on anticoagulation therapy. He is also on amiodarone. Limited TTE 01/2023 showed MV repair with trivial MR, moderate TR, no pericardial effusion. EF in 08/2022 was 45-50%. His last cardiology visit with Dr. Excell Seltzer was on 10/29/23. He was doing well fro ma cardiac standpoint. Reported listed for renal transplant at both [facility] and [facility] and preferred that he be transitioned from apixaban to warfarin so they could reverse  anticoagulation therapy if needed for surgery. He denied chest pain, SOB, edema, orthopnea, PND. He continued to get occasional palpations, but no increase in frequency. Volume status managed with dialysis. One year cardiology follow-up recommended.  Dr. Excell Seltzer added the following addendum: "Addendum 11/16/2023: Received request for preoperative clearance for implantation of a sleep device.  As outlined above, he was having no symptoms of angina and no active heart failure symptoms.  Volume management via hemodialysis noted.  I think he can proceed with surgery without further cardiovascular testing at low risk of cardiovascular complications.  He can clearly achieve greater than 4 metabolic equivalents without associated exertional symptoms.  He will hold warfarin before surgery per protocol."  As above, know paroxysmal aflutter (2% burden).  He did have to take an additional dose of metoprolol for aflutter at 121 bpm on 12/31/23 at PCP office. HR at PAT visit was 79 bpm.    Continue SBE prophylaxis. No new chest pain. Continue Crestor. He is not on ASA due to being on Eliquis. Zio monitor ordered for dyspnea with some elevated HR (which prompted referral back to EP). Six month follow-up recommended.    He is on Eliquis. He indicated that he was told he would not have to hold for this procedure.   Anesthesia team to evaluate on the day of surgery. He will need labs on arrival given ESRD.    VS: BP (!) 140/97   Pulse 79   Temp 36.8 C (Oral)   Resp 18   Ht 5\' 10"  (1.778 m)   Wt 89.8 kg   SpO2 100%   BMI 28.41 kg/m  PROVIDERS: [name], NP is PCP  Tonny Bollman, MD is primary cardiologist Steffanie Dunn, MD is EP  Eugenio Hoes, MD is CT surgeon Erick Blinks, MD is GI Merwyn Katos, DO is ID Malachi Bonds, MD is allergist Virl Diamond, MD is pulmonologist   LABS: Preoperative labs noted. He will need iSTAT on the day of surgery given ESRD and PT/INR since he is on  warfarin.  (all labs ordered are listed, but only abnormal results are displayed)  Labs Reviewed  COMPREHENSIVE METABOLIC PANEL - Abnormal; Notable for the following components:      Result Value   Sodium 133 (*)    Chloride 93 (*)    Glucose, Bld 100 (*)    BUN 34 (*)    Creatinine, Ser 8.34 (*)    Total Protein 8.2 (*)    GFR, Estimated 7 (*)    All other components within normal limits  CBC - Abnormal; Notable for the following components:   RBC 3.56 (*)    Hemoglobin 11.8 (*)    HCT 33.8 (*)    All other components within normal limits     IMAGES: CXR 12/31/23: FINDINGS: - Heart size is within normal limits. Previous mitral valve replacement noted. Stable right paratracheal soft tissue prominence, consistent with ectasia great vessels as seen on previous chest CTA. - Both lungs are clear. Previously seen opacity in the right lung base has resolved. No pleural effusion. IMPRESSION: No active cardiopulmonary disease.  CTA Chest 03/13/23: IMPRESSION: 1. No evidence of pulmonary embolism or other acute intrathoracic process. 2. Unable to assess for the possibility of aortic dissection on this exam timed for assessment of the pulmonary arterial tree. 3. Cardiomegaly with mildly dilated central pulmonary vasculature, suggestive of pulmonary arterial hypertension. 4. 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 Resorptive changes at the fracture margins with adjacent soft tissue thickening/fibrosis suggesting a pseudoarthrosis. 5. Healed fractures of multiple bilateral anterior ribs. 6. Thoracic aortic arch measures 3.4 cm in diameter. Recommend annual imaging followup by CTA or MRA. This recommendation follows 2010 ACCF/AHA/AATS/ACR/ASA/SCA/SCAI/SIR/STS/SVM Guidelines for the Diagnosis and Management of Patients with Thoracic Aortic Disease. Circulation.2010; 121: U981-X914. Aortic aneurysm NOS (ICD10-I71.9) 7. Aortic and coronary artery atherosclerosis  (ICD10-I70.0).   CT Perfusion Brain & CTA Head/Neck (post MV Repair) 08/21/22: IMPRESSION: 1. CT perfusion positive for a small area of infarct and ischemia left frontal lobe predominately in the white matter. 2. Negative for large vessel occlusion 3. No significant carotid or vertebral artery stenosis in the neck. 4. Postop changes right chest. Small right pneumothorax. Right chest tube in place.     EKG:  EKG 12/31/23: Atrial flutter at 121 bpm -Inferior ST-elevation -repolarization variant. ABNORMAL RHYTHM  EKG 12/14/23: Sinus rhythm Left atrial enlargement Left ventricular hypertrophy Prolonged QT interval Confirmed by Alvester Chou [phone]) on 12/14/2023 10:04:01 PM  EKG 05/05/23: Sinus rhythm with occasional Premature ventricular complexes Normal axis No ST-TW changes Similar to prior tracing Confirmed by [name] [id number]) on 05/05/2023 10:01:55 AM     CV: Long Term Zio Monitor 05/10/23 - 05/24/23: Patient had a min HR of 53 bpm, max HR of 193 bpm, and avg HR of 81 bpm. Predominant underlying rhythm was Sinus Rhythm. 1 run of Ventricular Tachycardia occurred lasting 4 beats with a max rate of 193 bpm (avg 182 bpm). Atrial Flutter occurred (2%  burden), ranging from 96-177 bpm (avg of 132 bpm), the longest lasting 33 mins 59 secs with an avg rate of 137 bpm. Atrial  Flutter may be possible Atrial Tachycardia with variable block. Isolated SVEs were occasional (3.5%, 57397), SVE Couplets were  occasional (1.0%, 8279), and SVE Triplets were rare (<1.0%, 1481). Isolated VEs were occasional (2.4%, 38757), VE Couplets were rare (<1.0%, 2424), and VE Triplets were rare (<1.0%, 27). Ventricular Bigeminy and Trigeminy were present. SUMMARY: Normal sinus with average HR of 81 bpm. Atrial flutter occurs with a 2% burden, average heart rate during atrial flutter is 137 bpm. Occasional PVC's with a burden of 2.4%.      Echo (Limited) 02/17/23: IMPRESSIONS   1. No pericardial effusion.    2. Left atrial size was severely dilated.   3. Post mitral repair with trivial residual MR . The mitral valve has  been repaired/replaced. There is a 30 mm [employer] prosthetic annuloplasty  ring. present in the mitral position. Procedure Date: 07/2022.   4. Tricuspid valve regurgitation is moderate.   5. The aortic valve is tricuspid. Aortic valve regurgitation is not  visualized. No aortic stenosis is present.   6. Aortic dilatation noted. There is mild dilatation of the ascending  aorta, measuring 39 mm.  - Comparison: Limited TTE 09/09/22: LVEF 45-50%, LV global hypokinesis, moderate concentric LVH, moderate RVH, MV repair with trivial MR; Complete TTE 08/22/22: LVEF 45-50%, LV global hypokinesis, mild LVH, grade 2 diastolic dysfunction, interventricular septum flattening consistent with RV pressure and volume overload, mildly reduced RV systolic function, moderate RVH, moderately elevated PASP, estimated RVSP 50 mmHg, severely dilated LA/RA, MV repair, severe TR    LHC 09/11/22:   Mid LAD lesion is 50% stenosed.   Prox LAD lesion is 50% stenosed.   Prox RCA lesion is 40% stenosed.   2nd Mrg lesion is 30% stenosed.   LV end diastolic pressure is mildly elevated.   There is no aortic valve stenosis. No change in coronary anatomy since October 2023.  Consider EP referral for consideration of AICD.       US Carotid 08/19/22: Summary:  - Right Carotid: The extracranial vessels were near-normal with only minimal  wall thickening or plaque.  - Left Carotid: The extracranial vessels were near-normal with only minimal  wall thickening or plaque.  - Vertebrals: Bilateral vertebral arteries demonstrate antegrade flow.      RHC/LHC 08/01/22: 1.  Mildly elevated pulmonary wedge pressure and PA pressure with mean wedge pressure of 17 mmHg and mean PA pressure of 25 mmHg, high cardiac output likely related to Fick measurement inpatient with AV fistula 2.  Stable moderate nonobstructive  coronary artery disease with patency of the left main, moderate 50% proximal LAD stenosis unchanged from the previous study, mild nonobstructive left circumflex stenosis (dominant vessel), and mild nonobstructive stenosis of a nondominant RCA Recommend: Continue evaluation per Dr. Leafy Ro with plans for surgical valve repair.     TEE (Pre-MV Repair) 06/03/22:  IMPRESSIONS   1. Severe mitral valve regurgitation. Mechanism appears likely atrial  functional and MR is commissural. No definite pulmonary vein reversals in  systole, may be due to low blood pressure during sedation. At ambulatory  blood pressures, suspect MR is  severe.. The mitral valve is grossly normal. Severe mitral valve  regurgitation. No evidence of mitral stenosis.   2. Left ventricular ejection fraction, by estimation, is 55%. The left  ventricle has normal function. The left ventricular internal cavity size  was mildly dilated.   3. Right ventricular systolic function is normal. The right ventricular  size is mildly enlarged.   4. Left atrial size was severely  dilated. No left atrial/left atrial  appendage thrombus was detected. The LAA emptying velocity was 62 cm/s.   5. Right atrial size was moderately dilated.   6. Tricuspid valve regurgitation is moderate.   7. The aortic valve is tricuspid. Aortic valve regurgitation is trivial.  No aortic stenosis is present.   8. Aortic dilatation noted. There is mild dilatation of the ascending  aorta, measuring 40 mm. There is mild (Grade II) atheroma plaque involving  the aortic arch and descending aorta.   9. Agitated saline contrast bubble study was negative, with no evidence  of any interatrial shunt.     Past Medical History:  Diagnosis Date   Anemia    Aortic atherosclerosis (HCC)    Arthritis    Ascending aorta dilation (HCC) 02/17/2023   Limited TTE 02/17/23: no effusion, s/p MV repair w trivial residual MR, asc aorta 39 mm, mod TR, severe LAE   Asthma    Cancer  (HCC)    renal cyst   Chronic diastolic CHF (congestive heart failure) (HCC) 01/06/2017   Echo 7/16 [facility] in [place], [place]) Mild AI, mild LAE, mild concentric LVH, EF 55, normal wall motion, mild to moderate MR, mild PI, RVSP 55 // Echo 10/09/16 ([facility]):  Moderate LVH, grade 2 diastolic dysfunction, mild MR, moderate LAE    Dysrhythmia    paroxysmal afib/flutter   Esophagitis    ESRD (end stage renal disease) on dialysis [facility])    Dialysis Mon Wed Fri   GERD (gastroesophageal reflux disease)    Gout    no current problems   Heart murmur    "had it since he was a baby"- pt had echo 10/01/23   Hepatitis    Hep B   HIV (human immunodeficiency virus infection) (HCC)    HLD (hyperlipidemia)    Hypertension    Hypothyroidism    Mitral regurgitation    Myocardial infarction (HCC)    Neuropathy    Sleep apnea    does not use c-pap   Stroke [facility])    Wears glasses    Wears partial dentures     Past Surgical History:  Procedure Laterality Date   AV FISTULA PLACEMENT Left 07/02/2018   Procedure: Creation of Left arm BRACHIOBASILIC ARTERIOVENOUS  FISTULA;  Surgeon: Cephus Shelling, MD;  Location: [facility] OR;  Service: Vascular;  Laterality: Left;   BASCILIC VEIN TRANSPOSITION Left 08/27/2018   Procedure: Left arm BRACHIOBASILIC VEIN TRANSPOSITION SECOND STAGE;  Surgeon: Cephus Shelling, MD;  Location: [facility] OR;  Service: Vascular;  Laterality: Left;   BUBBLE STUDY  09/03/2020   Procedure: BUBBLE STUDY;  Surgeon: Pricilla Riffle, MD;  Location: [facility] ENDOSCOPY;  Service: Cardiovascular;;   BUBBLE STUDY  06/03/2022   Procedure: BUBBLE STUDY;  Surgeon: Parke Poisson, MD;  Location: [facility] ENDOSCOPY;  Service: Cardiology;;   CAPD INSERTION N/A 05/30/2021   Procedure: LAPAROSCOPIC INSERTION CONTINUOUS AMBULATORY PERITONEAL DIALYSIS  (CAPD) CATHETER;  Surgeon: Leonie Douglas, MD;  [facility];  Service: Vascular;  Laterality: N/A;   CAPD REMOVAL N/A 02/13/2022   Procedure: PERITONEAL  DIALYSIS CATHETER REMOVAL;  Surgeon: Leonie Douglas, MD;  Location[facility];  Service: Vascular;  Laterality: N/A;   CHOLECYSTECTOMY     COLONOSCOPY W/ BIOPSIES AND POLYPECTOMY     DRUG INDUCED ENDOSCOPY Bilateral 07/06/2023   Procedure: DRUG INDUCED SLEEP ENDOSCOPY;  Surgeon: Christia Reading, MD;  Location: [facility] OR;  Service: ENT;  Laterality: Bilateral;   GIVENS CAPSULE STUDY N/A 03/01/2018  Procedure: GIVENS CAPSULE STUDY;  Surgeon: Beverley Fiedler, MD;  Location: [facility] ENDOSCOPY;  Service: Gastroenterology;  Laterality: N/A;   HERNIA REPAIR     As baby   LEFT HEART CATH AND CORONARY ANGIOGRAPHY N/A 09/11/2022   Procedure: LEFT HEART CATH AND CORONARY ANGIOGRAPHY;  Surgeon: Corky Crafts, MD;  Location: [facility] INVASIVE CV LAB;  Service: Cardiovascular;  Laterality: N/A;   MITRAL VALVE REPAIR N/A 08/21/2022   Procedure: MINIMALLY INVASIVE MITRAL VALVE REPAIR USING RING ANNULOPLASTY SIMULUS 30mm;  Surgeon: Eugenio Hoes, MD;  Location[facility];  Service: Open Heart Surgery;  Laterality: N/A;   MULTIPLE TOOTH EXTRACTIONS     NEPHRECTOMY Left 02/18/2021   NOSE SURGERY     RENAL BIOPSY     RIGHT/LEFT HEART CATH AND CORONARY ANGIOGRAPHY N/A 09/04/2021   Procedure: RIGHT/LEFT HEART CATH AND CORONARY ANGIOGRAPHY;  Surgeon: Kathleene Hazel, MD;  [facility] INVASIVE CV LAB;  Service: Cardiovascular;  Laterality: N/A;   RIGHT/LEFT HEART CATH AND CORONARY ANGIOGRAPHY N/A 08/01/2022   Procedure: RIGHT/LEFT HEART CATH AND CORONARY ANGIOGRAPHY;  Surgeon: Tonny Bollman, MD;  Location: [facility] INVASIVE CV LAB;  Service: Cardiovascular;  Laterality: N/A;   TEE WITHOUT CARDIOVERSION N/A 09/03/2020   Procedure: TRANSESOPHAGEAL ECHOCARDIOGRAM (TEE);  Surgeon: Pricilla Riffle, MD;  Location: [facility] ENDOSCOPY;  Service: Cardiovascular;  Laterality: N/A;   TEE WITHOUT CARDIOVERSION N/A 06/03/2022   Procedure: TRANSESOPHAGEAL ECHOCARDIOGRAM (TEE);  Surgeon: Parke Poisson, MD;  Location: [facility] ENDOSCOPY;  Service:  Cardiology;  Laterality: N/A;   TEE WITHOUT CARDIOVERSION N/A 08/21/2022   Procedure: TRANSESOPHAGEAL ECHOCARDIOGRAM (TEE);  Surgeon: Eugenio Hoes, MD;  Location: [facility] OR;  Service: Open Heart Surgery;  Laterality: N/A;   UPPER GI ENDOSCOPY  04/05/2021    MEDICATIONS:  levalbuterol (XOPENEX HFA) 45 MCG/ACT inhaler   allopurinol (ZYLOPRIM) 100 MG tablet   amLODipine (NORVASC) 2.5 MG tablet   azelastine (ASTELIN) 0.1 % nasal spray   B Complex-C-Zn-Folic Acid (DIALYVITE 800/ZINC PO)   bictegravir-emtricitabine-tenofovir AF (BIKTARVY) 50-200-25 MG TABS tablet   cetirizine (ZYRTEC) 10 MG tablet   cinacalcet (SENSIPAR) 90 MG tablet   diclofenac Sodium (VOLTAREN) 1 % GEL   famotidine (PEPCID) 20 MG tablet   ferric citrate (AURYXIA) 1 GM 210 MG(Fe) tablet   fluticasone (FLOVENT HFA) 110 MCG/ACT inhaler   gabapentin (NEURONTIN) 300 MG capsule   ipratropium (ATROVENT) 0.06 % nasal spray   iron sucrose (VENOFER) 20 MG/ML injection   lactulose (CHRONULAC) 10 GM/15ML solution   levothyroxine (SYNTHROID) 25 MCG tablet   lubiprostone (AMITIZA) 24 MCG capsule   Methoxy PEG-Epoetin Beta (MIRCERA IJ)   metoprolol tartrate (LOPRESSOR) 25 MG tablet   Olopatadine HCl (PATADAY) 0.7 % SOLN   pantoprazole (PROTONIX) 40 MG tablet   rOPINIRole (REQUIP) 1 MG tablet   rosuvastatin (CRESTOR) 10 MG tablet   sevelamer carbonate (RENVELA) 800 MG tablet   Spacer/Aero-Holding Chambers DEVI   SYMBICORT 160-4.5 MCG/ACT inhaler   tiZANidine (ZANAFLEX) 2 MG tablet   Vitamin D, Ergocalciferol, (DRISDOL) 1.25 MG (50000 UNIT) CAPS capsule   warfarin (COUMADIN) 5 MG tablet   No current facility-administered medications for this encounter.

## 2024-01-08 NOTE — Telephone Encounter (Signed)
 I s/w Albert Eaton and explained that we never received a clearance request for this pt. Our pharm-d reached out to the preop team to see if needing to hold warfarin for procedure with Dr. Chestine Spore. Albert Eaton said she will let surgery scheduler know that she needs to send clearance ASAP today to fax # [phone]. Once I have the request I will it into the chart ASAP.

## 2024-01-08 NOTE — Telephone Encounter (Signed)
 Preoperative team, please ask requesting office if patient will be required to hold Coumadin for 3/26 procedure.  Thank you for your help.  Thomasene Ripple. Sanjuana Mruk NP-C     01/08/2024, 11:21 AM [facility] [address] Office [phone] Fax [phone]

## 2024-01-11 ENCOUNTER — Encounter (HOSPITAL_COMMUNITY): Payer: Self-pay | Admitting: Otolaryngology

## 2024-01-11 ENCOUNTER — Other Ambulatory Visit: Payer: Self-pay

## 2024-01-11 DIAGNOSIS — D509 Iron deficiency anemia, unspecified: Secondary | ICD-10-CM | POA: Diagnosis not present

## 2024-01-11 DIAGNOSIS — Z992 Dependence on renal dialysis: Secondary | ICD-10-CM | POA: Diagnosis not present

## 2024-01-11 DIAGNOSIS — D689 Coagulation defect, unspecified: Secondary | ICD-10-CM | POA: Diagnosis not present

## 2024-01-11 DIAGNOSIS — T8249XD Other complication of vascular dialysis catheter, subsequent encounter: Secondary | ICD-10-CM | POA: Diagnosis not present

## 2024-01-11 DIAGNOSIS — N2581 Secondary hyperparathyroidism of renal origin: Secondary | ICD-10-CM | POA: Diagnosis not present

## 2024-01-11 DIAGNOSIS — E876 Hypokalemia: Secondary | ICD-10-CM | POA: Diagnosis not present

## 2024-01-11 DIAGNOSIS — D631 Anemia in chronic kidney disease: Secondary | ICD-10-CM | POA: Diagnosis not present

## 2024-01-11 DIAGNOSIS — N186 End stage renal disease: Secondary | ICD-10-CM | POA: Diagnosis not present

## 2024-01-11 NOTE — Progress Notes (Signed)
 SDW call  Patient was given pre-op instructions over the phone. Patient verbalized understanding of instructions provided.     PCP - Charlena Cross Cardiologist - Dr. Tonny Bollman EP Cardiologist: Dr. Steffanie Dunn Pulmonary:    PPM/ICD - denies Device Orders - na Rep Notified - na   Chest x-ray - 12/31/2023 EKG -  12/31/2023 Stress Test -10/01/2023 ECHO - 10/01/2023 Cardiac Cath - 09/11/2022  Sleep Study/sleep apnea/CPAP: Diagnosed with sleep apnea, does not use CPAP  Non-diabetic  Blood Thinner Instructions: Coumadin, states last dose 01/08/2024 Aspirin Instructions:denies   ERAS Protcol - Clears until 1145   Anesthesia review: Reviewed 01/07/2024.  HTN, CHF, MI, stroke, A-fib/flutter, OSA, dialysis   Patient denies shortness of breath, fever, cough and chest pain over the phone call  Your procedure is scheduled on Thursday Caras 20, 2025  Report to [facility] Main Entrance "A" at  1215 PM, then check in with the Admitting office.  Call this number if you have problems the morning of surgery:  [phone]   If you have any questions prior to your surgery date call [phone]: Open Monday-Friday 8am-4pm If you experience any cold or flu symptoms such as cough, fever, chills, shortness of breath, etc. between now and your scheduled surgery, please notify us at the above number    Remember:  Do not eat after midnight the night before your surgery  You may drink clear liquids until  1145   the morning of your surgery.   Clear liquids allowed are: Water, Non-Citrus Juices (without pulp), Carbonated Beverages, Clear Tea, Black Coffee ONLY (NO MILK, CREAM OR POWDERED CREAMER of any kind), and Gatorade   Take these medicines the morning of surgery with A SIP OF WATER:  Alllopurinol, norvasc, biktarvy, zyrtec, sensipar, pepcid, synthroid, lopressor, protonix, crestor  As needed:Astelin, flovent inhaler, neurontin, atrovent, xopenex, symbicort, zanaflex   As of today, STOP  taking any Aspirin (unless otherwise instructed by your surgeon) Aleve, Naproxen, Ibuprofen, Motrin, Advil, Goody's, BC's, all herbal medications, fish oil, and all vitamins.

## 2024-01-12 ENCOUNTER — Telehealth: Payer: Self-pay

## 2024-01-12 NOTE — Telephone Encounter (Signed)
   Name: Albert Eaton  DOB: 15-Feb-1966  MRN: [id number]  Primary Cardiologist: Tonny Bollman, MD   Preoperative team, please contact this patient and set up a phone call appointment for further preoperative risk assessment. Please obtain consent and complete medication review. Thank you for your help.  I confirm that guidance regarding antiplatelet and oral anticoagulation therapy has been completed and, if necessary, noted below.  Per office protocol, patient can hold warfarin for 5 days prior to procedure.   Patient will not need bridging with Lovenox (enoxaparin) around procedure.  Patient should continue to hold warfarin between procedures. Restart after procedure on 3/26 or 3/27 at discretion of surgeon.    I also confirmed the patient resides in the state of [place]. As per [facility] Medical Board telemedicine laws, the patient must reside in the state in which the provider is licensed.   Ronney Asters, NP 01/12/2024, 8:38 AM [facility]

## 2024-01-12 NOTE — Telephone Encounter (Signed)
Patient has been scheduled for telephone appt

## 2024-01-12 NOTE — Telephone Encounter (Signed)
 Patient should continue to hold warfarin between procedures.  Restart after procedure on 3/26 or 3/27 at discretion of surgeon.

## 2024-01-12 NOTE — Telephone Encounter (Signed)
 Patient has been scheduled med rec and consent done     Patient Consent for Virtual Visit         Albert Eaton has provided verbal consent on 01/12/2024 for a virtual visit (video or telephone).   CONSENT FOR VIRTUAL VISIT FOR:  Albert Eaton  By participating in this virtual visit I agree to the following:  I hereby voluntarily request, consent and authorize [facility] and its employed or contracted physicians, physician assistants, nurse practitioners or other licensed health care professionals (the Practitioner), to provide me with telemedicine health care services (the "Services") as deemed necessary by the treating Practitioner. I acknowledge and consent to receive the Services by the Practitioner via telemedicine. I understand that the telemedicine visit will involve communicating with the Practitioner through live audiovisual communication technology and the disclosure of certain medical information by electronic transmission. I acknowledge that I have been given the opportunity to request an in-person assessment or other available alternative prior to the telemedicine visit and am voluntarily participating in the telemedicine visit.  I understand that I have the right to withhold or withdraw my consent to the use of telemedicine in the course of my care at any time, without affecting my right to future care or treatment, and that the Practitioner or I may terminate the telemedicine visit at any time. I understand that I have the right to inspect all information obtained and/or recorded in the course of the telemedicine visit and may receive copies of available information for a reasonable fee.  I understand that some of the potential risks of receiving the Services via telemedicine include:  Delay or interruption in medical evaluation Eaton to technological equipment failure or disruption; Information transmitted may not be sufficient (e.g. poor resolution of images) to allow  for appropriate medical decision making by the Practitioner; and/or  In rare instances, security protocols could fail, causing a breach of personal health information.  Furthermore, I acknowledge that it is my responsibility to provide information about my medical history, conditions and care that is complete and accurate to the best of my ability. I acknowledge that Practitioner's advice, recommendations, and/or decision may be based on factors not within their control, such as incomplete or inaccurate data provided by me or distortions of diagnostic images or specimens that may result from electronic transmissions. I understand that the practice of medicine is not an exact science and that Practitioner makes no warranties or guarantees regarding treatment outcomes. I acknowledge that a copy of this consent can be made available to me via my patient portal [facility] MyChart), or I can request a printed copy by calling the office of [facility].    I understand that my insurance will be billed for this visit.   I have read or had this consent read to me. I understand the contents of this consent, which adequately explains the benefits and risks of the Services being provided via telemedicine.  I have been provided ample opportunity to ask questions regarding this consent and the Services and have had my questions answered to my satisfaction. I give my informed consent for the services to be provided through the use of telemedicine in my medical care

## 2024-01-13 DIAGNOSIS — D631 Anemia in chronic kidney disease: Secondary | ICD-10-CM | POA: Diagnosis not present

## 2024-01-13 DIAGNOSIS — T8249XD Other complication of vascular dialysis catheter, subsequent encounter: Secondary | ICD-10-CM | POA: Diagnosis not present

## 2024-01-13 DIAGNOSIS — Z992 Dependence on renal dialysis: Secondary | ICD-10-CM | POA: Diagnosis not present

## 2024-01-13 DIAGNOSIS — N186 End stage renal disease: Secondary | ICD-10-CM | POA: Diagnosis not present

## 2024-01-13 DIAGNOSIS — D509 Iron deficiency anemia, unspecified: Secondary | ICD-10-CM | POA: Diagnosis not present

## 2024-01-13 DIAGNOSIS — D689 Coagulation defect, unspecified: Secondary | ICD-10-CM | POA: Diagnosis not present

## 2024-01-13 DIAGNOSIS — N2581 Secondary hyperparathyroidism of renal origin: Secondary | ICD-10-CM | POA: Diagnosis not present

## 2024-01-13 DIAGNOSIS — E876 Hypokalemia: Secondary | ICD-10-CM | POA: Diagnosis not present

## 2024-01-14 ENCOUNTER — Other Ambulatory Visit: Payer: Self-pay

## 2024-01-14 ENCOUNTER — Ambulatory Visit (HOSPITAL_COMMUNITY)

## 2024-01-14 ENCOUNTER — Encounter (HOSPITAL_COMMUNITY)
Admission: RE | Disposition: A | Discharge status: Home / Self Care | Payer: Self-pay | Source: Home / Self Care | Attending: Otolaryngology

## 2024-01-14 ENCOUNTER — Ambulatory Visit (HOSPITAL_COMMUNITY): Payer: Self-pay | Admitting: Vascular Surgery

## 2024-01-14 ENCOUNTER — Ambulatory Visit (HOSPITAL_BASED_OUTPATIENT_CLINIC_OR_DEPARTMENT_OTHER): Payer: Self-pay | Admitting: Vascular Surgery

## 2024-01-14 ENCOUNTER — Ambulatory Visit (HOSPITAL_COMMUNITY)
Admission: RE | Admit: 2024-01-14 | Discharge: 2024-01-14 | Disposition: A | Discharge status: Home / Self Care | Payer: 59 | Attending: Otolaryngology | Admitting: Otolaryngology

## 2024-01-14 ENCOUNTER — Encounter (HOSPITAL_COMMUNITY): Payer: Self-pay | Admitting: Otolaryngology

## 2024-01-14 DIAGNOSIS — Z85528 Personal history of other malignant neoplasm of kidney: Secondary | ICD-10-CM | POA: Insufficient documentation

## 2024-01-14 DIAGNOSIS — E785 Hyperlipidemia, unspecified: Secondary | ICD-10-CM | POA: Insufficient documentation

## 2024-01-14 DIAGNOSIS — Z9889 Other specified postprocedural states: Secondary | ICD-10-CM | POA: Diagnosis not present

## 2024-01-14 DIAGNOSIS — Z8674 Personal history of sudden cardiac arrest: Secondary | ICD-10-CM | POA: Insufficient documentation

## 2024-01-14 DIAGNOSIS — E663 Overweight: Secondary | ICD-10-CM | POA: Diagnosis not present

## 2024-01-14 DIAGNOSIS — I77819 Aortic ectasia, unspecified site: Secondary | ICD-10-CM | POA: Insufficient documentation

## 2024-01-14 DIAGNOSIS — I480 Paroxysmal atrial fibrillation: Secondary | ICD-10-CM | POA: Diagnosis not present

## 2024-01-14 DIAGNOSIS — Z79899 Other long term (current) drug therapy: Secondary | ICD-10-CM | POA: Diagnosis not present

## 2024-01-14 DIAGNOSIS — E039 Hypothyroidism, unspecified: Secondary | ICD-10-CM | POA: Diagnosis not present

## 2024-01-14 DIAGNOSIS — Z7682 Awaiting organ transplant status: Secondary | ICD-10-CM | POA: Insufficient documentation

## 2024-01-14 DIAGNOSIS — I132 Hypertensive heart and chronic kidney disease with heart failure and with stage 5 chronic kidney disease, or end stage renal disease: Secondary | ICD-10-CM | POA: Insufficient documentation

## 2024-01-14 DIAGNOSIS — Z8673 Personal history of transient ischemic attack (TIA), and cerebral infarction without residual deficits: Secondary | ICD-10-CM | POA: Diagnosis not present

## 2024-01-14 DIAGNOSIS — Z905 Acquired absence of kidney: Secondary | ICD-10-CM | POA: Insufficient documentation

## 2024-01-14 DIAGNOSIS — I2510 Atherosclerotic heart disease of native coronary artery without angina pectoris: Secondary | ICD-10-CM

## 2024-01-14 DIAGNOSIS — N186 End stage renal disease: Secondary | ICD-10-CM | POA: Diagnosis not present

## 2024-01-14 DIAGNOSIS — D631 Anemia in chronic kidney disease: Secondary | ICD-10-CM | POA: Diagnosis not present

## 2024-01-14 DIAGNOSIS — I5032 Chronic diastolic (congestive) heart failure: Secondary | ICD-10-CM | POA: Diagnosis not present

## 2024-01-14 DIAGNOSIS — I110 Hypertensive heart disease with heart failure: Secondary | ICD-10-CM

## 2024-01-14 DIAGNOSIS — B181 Chronic viral hepatitis B without delta-agent: Secondary | ICD-10-CM | POA: Insufficient documentation

## 2024-01-14 DIAGNOSIS — G4733 Obstructive sleep apnea (adult) (pediatric): Secondary | ICD-10-CM

## 2024-01-14 DIAGNOSIS — Z87891 Personal history of nicotine dependence: Secondary | ICD-10-CM | POA: Diagnosis not present

## 2024-01-14 DIAGNOSIS — J45909 Unspecified asthma, uncomplicated: Secondary | ICD-10-CM | POA: Diagnosis not present

## 2024-01-14 DIAGNOSIS — I5033 Acute on chronic diastolic (congestive) heart failure: Secondary | ICD-10-CM

## 2024-01-14 DIAGNOSIS — I4891 Unspecified atrial fibrillation: Secondary | ICD-10-CM | POA: Insufficient documentation

## 2024-01-14 DIAGNOSIS — K219 Gastro-esophageal reflux disease without esophagitis: Secondary | ICD-10-CM | POA: Insufficient documentation

## 2024-01-14 DIAGNOSIS — Z7901 Long term (current) use of anticoagulants: Secondary | ICD-10-CM | POA: Insufficient documentation

## 2024-01-14 DIAGNOSIS — I252 Old myocardial infarction: Secondary | ICD-10-CM | POA: Insufficient documentation

## 2024-01-14 DIAGNOSIS — Z6828 Body mass index (BMI) 28.0-28.9, adult: Secondary | ICD-10-CM | POA: Insufficient documentation

## 2024-01-14 DIAGNOSIS — I4892 Unspecified atrial flutter: Secondary | ICD-10-CM | POA: Insufficient documentation

## 2024-01-14 DIAGNOSIS — J9811 Atelectasis: Secondary | ICD-10-CM | POA: Diagnosis not present

## 2024-01-14 DIAGNOSIS — Z992 Dependence on renal dialysis: Secondary | ICD-10-CM | POA: Diagnosis not present

## 2024-01-14 DIAGNOSIS — R918 Other nonspecific abnormal finding of lung field: Secondary | ICD-10-CM | POA: Diagnosis not present

## 2024-01-14 DIAGNOSIS — Z21 Asymptomatic human immunodeficiency virus [HIV] infection status: Secondary | ICD-10-CM | POA: Diagnosis not present

## 2024-01-14 HISTORY — PX: IMPLANTATION OF HYPOGLOSSAL NERVE STIMULATOR: SHX6827

## 2024-01-14 LAB — PROTIME-INR
INR: 1.2 (ref 0.8–1.2)
Prothrombin Time: 15 s (ref 11.4–15.2)

## 2024-01-14 LAB — POCT I-STAT, CHEM 8
BUN: 35 mg/dL — ABNORMAL HIGH (ref 6–20)
Calcium, Ion: 0.98 mmol/L — ABNORMAL LOW (ref 1.15–1.40)
Chloride: 98 mmol/L (ref 98–111)
Creatinine, Ser: 11.1 mg/dL — ABNORMAL HIGH (ref 0.61–1.24)
Glucose, Bld: 90 mg/dL (ref 70–99)
HCT: 34 % — ABNORMAL LOW (ref 39.0–52.0)
Hemoglobin: 11.6 g/dL — ABNORMAL LOW (ref 13.0–17.0)
Potassium: 3.7 mmol/L (ref 3.5–5.1)
Sodium: 136 mmol/L (ref 135–145)
TCO2: 30 mmol/L (ref 22–32)

## 2024-01-14 SURGERY — INSERTION, HYPOGLOSSAL NERVE STIMULATOR
Anesthesia: General | Site: Chest | Laterality: Right

## 2024-01-14 MED ORDER — FENTANYL CITRATE (PF) 250 MCG/5ML IJ SOLN
INTRAMUSCULAR | Status: AC
  Filled 2024-01-14: qty 5

## 2024-01-14 MED ORDER — ACETAMINOPHEN 10 MG/ML IV SOLN
INTRAVENOUS | Status: AC
  Filled 2024-01-14: qty 100

## 2024-01-14 MED ORDER — ONDANSETRON HCL 4 MG/2ML IJ SOLN
INTRAMUSCULAR | Status: AC
  Filled 2024-01-14: qty 2

## 2024-01-14 MED ORDER — LIDOCAINE 2% (20 MG/ML) 5 ML SYRINGE
INTRAMUSCULAR | Status: DC | PRN
  Administered 2024-01-14: 40 mg via INTRAVENOUS

## 2024-01-14 MED ORDER — ORAL CARE MOUTH RINSE: 15.0000 mL | Freq: Once | OROMUCOSAL | Status: AC

## 2024-01-14 MED ORDER — SUCCINYLCHOLINE CHLORIDE 200 MG/10ML IV SOSY
PREFILLED_SYRINGE | INTRAVENOUS | Status: AC
  Filled 2024-01-14: qty 10

## 2024-01-14 MED ORDER — LACTATED RINGERS IV SOLN: INTRAVENOUS | Status: DC

## 2024-01-14 MED ORDER — SUCCINYLCHOLINE CHLORIDE 200 MG/10ML IV SOSY
PREFILLED_SYRINGE | INTRAVENOUS | Status: DC | PRN
  Administered 2024-01-14: 100 mg via INTRAVENOUS

## 2024-01-14 MED ORDER — DEXAMETHASONE SODIUM PHOSPHATE 10 MG/ML IJ SOLN
INTRAMUSCULAR | Status: DC | PRN
  Administered 2024-01-14: 10 mg via INTRAVENOUS

## 2024-01-14 MED ORDER — LIDOCAINE-EPINEPHRINE 1 %-1:100000 IJ SOLN
INTRAMUSCULAR | Status: AC
  Filled 2024-01-14: qty 1

## 2024-01-14 MED ORDER — SODIUM CHLORIDE 0.9 % IV SOLN: INTRAVENOUS | Status: DC

## 2024-01-14 MED ORDER — CHLORHEXIDINE GLUCONATE 0.12 % MT SOLN: 15.0000 mL | Freq: Once | OROMUCOSAL | Status: AC

## 2024-01-14 MED ORDER — SODIUM CHLORIDE 0.9 % IV SOLN
0.0125 ug/kg/min | Freq: Once | INTRAVENOUS | Status: AC
  Administered 2024-01-14: .05 ug/kg/min via INTRAVENOUS
  Filled 2024-01-14: qty 2000

## 2024-01-14 MED ORDER — ACETAMINOPHEN 10 MG/ML IV SOLN
INTRAVENOUS | Status: DC | PRN
  Administered 2024-01-14: 500 mg via INTRAVENOUS

## 2024-01-14 MED ORDER — LIDOCAINE-EPINEPHRINE 1 %-1:100000 IJ SOLN
INTRAMUSCULAR | Status: DC | PRN
  Administered 2024-01-14: 5.5 mL via INTRADERMAL

## 2024-01-14 MED ORDER — CHLORHEXIDINE GLUCONATE 0.12 % MT SOLN
OROMUCOSAL | Status: AC
  Administered 2024-01-14: 15 mL via OROMUCOSAL
  Filled 2024-01-14: qty 15

## 2024-01-14 MED ORDER — CEFAZOLIN SODIUM-DEXTROSE 2-4 GM/100ML-% IV SOLN
2.0000 g | INTRAVENOUS | Status: AC
  Administered 2024-01-14: 2 g via INTRAVENOUS

## 2024-01-14 MED ORDER — FENTANYL CITRATE (PF) 100 MCG/2ML IJ SOLN: 25.0000 ug | INTRAMUSCULAR | Status: DC | PRN

## 2024-01-14 MED ORDER — 0.9 % SODIUM CHLORIDE (POUR BTL) OPTIME
TOPICAL | Status: DC | PRN
  Administered 2024-01-14: 1000 mL

## 2024-01-14 MED ORDER — MIDAZOLAM HCL 2 MG/2ML IJ SOLN
INTRAMUSCULAR | Status: DC | PRN
  Administered 2024-01-14: 2 mg via INTRAVENOUS

## 2024-01-14 MED ORDER — ACETAMINOPHEN 500 MG PO TABS: 1000.0000 mg | ORAL_TABLET | Freq: Once | ORAL | Status: DC

## 2024-01-14 MED ORDER — CEFAZOLIN SODIUM-DEXTROSE 2-4 GM/100ML-% IV SOLN
INTRAVENOUS | Status: AC
  Filled 2024-01-14: qty 100

## 2024-01-14 MED ORDER — MIDAZOLAM HCL 2 MG/2ML IJ SOLN
INTRAMUSCULAR | Status: AC
Start: 2024-01-14 — End: ?
  Filled 2024-01-14: qty 2

## 2024-01-14 MED ORDER — PROPOFOL 10 MG/ML IV BOLUS
INTRAVENOUS | Status: DC | PRN
  Administered 2024-01-14: 100 mg via INTRAVENOUS

## 2024-01-14 MED ORDER — HYDROCODONE-ACETAMINOPHEN 5-325 MG PO TABS: 1.0000 | ORAL_TABLET | Freq: Four times a day (QID) | ORAL | 0 refills | Status: DC | PRN

## 2024-01-14 MED ORDER — STERILE WATER FOR IRRIGATION IR SOLN
Status: DC | PRN
  Administered 2024-01-14: 1000 mL

## 2024-01-14 MED ORDER — LIDOCAINE 2% (20 MG/ML) 5 ML SYRINGE
INTRAMUSCULAR | Status: AC
  Filled 2024-01-14: qty 5

## 2024-01-14 MED ORDER — PROPOFOL 10 MG/ML IV BOLUS
INTRAVENOUS | Status: AC
  Filled 2024-01-14: qty 20

## 2024-01-14 MED ORDER — PHENYLEPHRINE HCL-NACL 20-0.9 MG/250ML-% IV SOLN
INTRAVENOUS | Status: DC | PRN
  Administered 2024-01-14: 50 ug/min via INTRAVENOUS

## 2024-01-14 MED ORDER — ONDANSETRON HCL 4 MG/2ML IJ SOLN
INTRAMUSCULAR | Status: DC | PRN
  Administered 2024-01-14: 4 mg via INTRAVENOUS

## 2024-01-14 MED ORDER — FENTANYL CITRATE (PF) 250 MCG/5ML IJ SOLN
INTRAMUSCULAR | Status: DC | PRN
  Administered 2024-01-14: 50 ug via INTRAVENOUS

## 2024-01-14 MED ORDER — DEXAMETHASONE SODIUM PHOSPHATE 10 MG/ML IJ SOLN
INTRAMUSCULAR | Status: AC
  Filled 2024-01-14: qty 1

## 2024-01-14 SURGICAL SUPPLY — 60 items
BAG COUNTER SPONGE SURGICOUNT (BAG) ×1 IMPLANT
BLADE CLIPPER SURG (BLADE) IMPLANT
BLADE SURG 15 STRL LF DISP TIS (BLADE) ×1 IMPLANT
CORD BIPOLAR FORCEPS 12FT (ELECTRODE) ×1 IMPLANT
COVER PROBE W GEL 5X96 (DRAPES) ×1 IMPLANT
COVER SURGICAL LIGHT HANDLE (MISCELLANEOUS) ×1 IMPLANT
DERMABOND ADVANCED .7 DNX12 (GAUZE/BANDAGES/DRESSINGS) ×1 IMPLANT
DRAPE C-ARM 35X43 STRL (DRAPES) ×1 IMPLANT
DRAPE HEAD BAR (DRAPES) ×1 IMPLANT
DRAPE INCISE IOBAN 66X45 STRL (DRAPES) ×1 IMPLANT
DRAPE MICROSCOPE LEICA 54X105 (DRAPES) ×1 IMPLANT
DRAPE UTILITY XL STRL (DRAPES) ×1 IMPLANT
DRSG TEGADERM 4X4.75 (GAUZE/BANDAGES/DRESSINGS) ×3 IMPLANT
ELECT COATED BLADE 2.86 ST (ELECTRODE) ×1 IMPLANT
ELECT EMG 18 NIMS (NEUROSURGERY SUPPLIES) ×1 IMPLANT
ELECT REM PT RETURN 9FT ADLT (ELECTROSURGICAL) ×1 IMPLANT
ELECTRODE EMG 18 NIMS (NEUROSURGERY SUPPLIES) ×1 IMPLANT
ELECTRODE REM PT RTRN 9FT ADLT (ELECTROSURGICAL) ×1 IMPLANT
FORCEPS BIPOLAR SPETZLER 8 1.0 (NEUROSURGERY SUPPLIES) ×1 IMPLANT
GAUZE 4X4 16PLY ~~LOC~~+RFID DBL (SPONGE) ×1 IMPLANT
GAUZE SPONGE 4X4 12PLY STRL (GAUZE/BANDAGES/DRESSINGS) ×1 IMPLANT
GENERATOR PULSE INSPIRE (Generator) ×1 IMPLANT
GENERATOR PULSE INSPIRE IV (Generator) ×1 IMPLANT
GLOVE BIO SURGEON STRL SZ7.5 (GLOVE) ×1 IMPLANT
GOWN STRL REUS W/ TWL LRG LVL3 (GOWN DISPOSABLE) ×3 IMPLANT
IV CATH 18GX1.25 SAFE RETR GRN (IV SOLUTION) IMPLANT
KIT BASIN OR (CUSTOM PROCEDURE TRAY) ×1 IMPLANT
KIT NEURO ACCESSORY W/WRENCH (MISCELLANEOUS) IMPLANT
KIT TURNOVER KIT B (KITS) ×1 IMPLANT
LEAD SENSING RESP INSPIRE (Lead) ×1 IMPLANT
LEAD SENSING RESP INSPIRE IV (Lead) ×1 IMPLANT
LEAD SLEEP STIM INSPIRE IV/V (Lead) ×1 IMPLANT
LEAD SLEEP STIMULATION INSPIRE (Lead) ×1 IMPLANT
LOOP VASCLR MAXI BLUE 18IN ST (MISCELLANEOUS) ×1 IMPLANT
LOOPS VASCLR MAXI BLUE 18IN ST (MISCELLANEOUS) ×1 IMPLANT
MARKER SKIN DUAL TIP RULER LAB (MISCELLANEOUS) ×2 IMPLANT
NDL HYPO 25GX1X1/2 BEV (NEEDLE) ×1 IMPLANT
NEEDLE HYPO 25GX1X1/2 BEV (NEEDLE) ×1 IMPLANT
NS IRRIG 1000ML POUR BTL (IV SOLUTION) ×1 IMPLANT
PAD ARMBOARD POSITIONER FOAM (MISCELLANEOUS) ×1 IMPLANT
PASSER CATH 38CM DISP (INSTRUMENTS) ×1 IMPLANT
PENCIL SMOKE EVACUATOR (MISCELLANEOUS) ×1 IMPLANT
POSITIONER HEAD DONUT 9IN (MISCELLANEOUS) ×1 IMPLANT
PROBE NERVE STIMULATOR (NEUROSURGERY SUPPLIES) ×1 IMPLANT
REMOTE CONTROL SLEEP INSPIRE (MISCELLANEOUS) ×1 IMPLANT
SET WALTER ACTIVATION W/DRAPE (SET/KITS/TRAYS/PACK) ×1 IMPLANT
SPONGE INTESTINAL PEANUT (DISPOSABLE) ×1 IMPLANT
SUT SILK 2 0 SH (SUTURE) ×1 IMPLANT
SUT SILK 3 0 REEL (SUTURE) ×1 IMPLANT
SUT SILK 3 0 SH 30 (SUTURE) ×2 IMPLANT
SUT SILK 3-0 RB1 30XBRD (SUTURE) ×1 IMPLANT
SUT VIC AB 3-0 SH 27X BRD (SUTURE) ×2 IMPLANT
SUT VIC AB 4-0 PS2 27 (SUTURE) ×2 IMPLANT
SUTURE SILK 3-0 RB1 30XBRD (SUTURE) ×1 IMPLANT
SYR 10ML LL (SYRINGE) ×1 IMPLANT
TAPE CLOTH SURG 4X10 WHT LF (GAUZE/BANDAGES/DRESSINGS) ×1 IMPLANT
TIE VASCULAR MAXI BLUE 18IN ST (MISCELLANEOUS) ×1 IMPLANT
TOWEL GREEN STERILE (TOWEL DISPOSABLE) ×1 IMPLANT
TRAY ENT MC OR (CUSTOM PROCEDURE TRAY) ×1 IMPLANT
VASCULAR TIE MINI RED 18IN STL (MISCELLANEOUS) ×1 IMPLANT

## 2024-01-14 NOTE — Transfer of Care (Signed)
 Immediate Anesthesia Transfer of Care Note  Patient: Albert Eaton  Procedure(s) Performed: RIGHT IMPLANTATION OF HYPOGLOSSAL NERVE STIMULATOR (Right: Chest)  Patient Location: PACU  Anesthesia Type:General  Level of Consciousness: awake and alert   Airway & Oxygen Therapy: Patient Spontanous Breathing and Patient connected to nasal cannula oxygen  Post-op Assessment: Report given to RN and Post -op Vital signs reviewed and stable  Post vital signs: Reviewed and stable  Last Vitals:  Vitals Value Taken Time  BP 134/97 01/14/24 1719  Temp    Pulse 72 01/14/24 1722  Resp 19 01/14/24 1722  SpO2 97 % 01/14/24 1722  Vitals shown include unfiled device data.  Last Pain:  Vitals:   01/14/24 1259  TempSrc:   PainSc: 0-No pain         Complications: No notable events documented.

## 2024-01-14 NOTE — Op Note (Signed)
 PREOPERATIVE DIAGNOSIS:  Obstructive sleep apnea.   POSTOPERATIVE DIAGNOSIS:  Obstructive sleep apnea.   PROCEDURE:  Placement of hypoglossal nerve stimulator including placement of sensor lead and testing of stimulator.   SURGEON:  Christia Reading, MD   ASSISTANT:  Aquilla Hacker, PA, who was necessary for assistance with retraction and closure.   ANESTHESIA:  General endotracheal anesthesia.   COMPLICATIONS:  None.   INDICATIONS:  The patient is a 58 year old male with a history of obstructive sleep apnea who has not been able to tolerate CPAP.  He presents to the operating room for placement of hypoglossal nerve stimulator.   FINDINGS:  Surgical anatomy was unremarkable.  The device was tested intraoperatively and demonstrated excellent stimulation and sensor lead function.   DESCRIPTION OF PROCEDURE:  The patient was identified in the holding room, informed consent having been obtained, including discussion of risks, benefits and alternatives, the patient was brought to the operative suite and put on the operative table in  supine position.  Anesthesia was induced and the patient was intubated by the anesthesia team without difficulty.  The patient was given intravenous antibiotics during the case.  The eyes were taped closed and the bed was turned 180 degrees from  anesthesia and a shoulder roll was placed.  The right neck and right chest incisions were marked with a marking pen after measuring and injected with 1% lidocaine with 1:100,000 epinephrine.  The nerve integrity monitor was placed in the right lateral  tongue and floor of mouth and turned on during the case.  The right neck and chest were prepped and draped in sterile fashion.  The neck incision was made with a 15 blade scalpel and extended through subcutaneous tissue to the platysmal layer using Bovie  electrocautery.  Dissection was then extended down the platysma layer somewhat until it was divided more inferiorly.  This  allowed direct dissection down onto the lower portion of the submandibular gland and ultimately the digastric tendon.  The tendon  was retracted inferiorly with two vessel loops.  Further dissection along the digastric exposed the mylohyoid muscle, which was then retracted anteriorly exposing the hypoglossal nerve.  The fascia over the nerve was then divided using bipolar  electrocautery to control bleeding.  The various branches of the nerve were then identified including the C1 branch and the more distal branches off of the main trunk.  Nerve stimulator was then used to identify which branches would be included as  protrusion branches and excluded as retrusion branches.  The break point between that on the superior surface of the nerve was then identified and the inclusion portion of the nerve was then elevated allowing pocket for cuff placement.  The cuff was then  brought into the field and placed around the inclusion branches and properly positioned.  Saline was then injected under the cuff.  The anchor for this lead was then sutured to the digastric tendon using 3-0 silk suture in 2 positions.  The stimulating lead was  then fully placed in the neck and covered with a damp gauze.  The infraclavicular incision was made using a 15 blade scalpel and extended through the subcutaneous tissues using Bovie electrocautery down to the pectoralis fascia.  A pocket was then  created inferiorly for the generator.  2 stay sutures were then placed at the superior lateral extent of the wound using 2-0 silk suture and these were left in place.  Dissection was then extended through  the pectoralis major muscle overlying the  second intercostal space.  Fatty tissue deep to the muscle was swept exposing the external intercostal muscle.  This muscle was dissected more medially exposing the internal intercostal muscle.  A retractor was then gently placed between those muscles running posteriorly and the sensor lead was then  placed into that pocket easily.  The anchor was then sutured with 3-0 silk suture in 3 positions.  The other anchor was then secured on the pectoralis muscle using 3-0 silk in 2 positions.  The neck incision was then uncovered and the lead unraveled.  The tunneling pocket was started with a hemostat under the platysma muscle and extended with the tunneling down over the clavicle to the generator pocket and the stimulating lead was then pulled into  the generator pocket with a little bit of slack left in the neck.  There was bleeding initially in passing this so the lead was backed out and the external jugular vein was exposed a bit from the neck site and the bleeding site controlled with ligature.  The tunneler was then reintroduced through a different site and the lead pulled down into the chest site with no further difficulty.  Both leads were then cleaned off with damp gauze and dried.  Each one was placed into the generator tightening down the screws to 2 clicks on both occasions.  Both leads were tugged and  found to be in good position.  The generator was then positioned into the generator pocket and testing then commenced.  After testing demonstrated good stimulation and good sensor lead function, each wound was copiously irrigated with saline.  The chest wound was closed in the deep tissues with 3-0 Vicryl suture in simple interrupted fashion and in the subcutaneous layer using 4-0 Vicryl suture in a simple interrupted fashion.  The neck incision was closed in the platysma layer with 3-0 Vicryl suture in a  simple interrupted fashion and then in the subcutaneous layer using 4-0 Vicryl suture in a simple interrupted fashion.  Both incisions were covered with Dermabond.  The patient was then cleaned off and drapes were removed.  The nerve integrity monitor  was removed.  Each incision was then covered with gauze pressure dressing.  The patient was then returned to anesthesia for wakeup and was extubated  and moved to the recovery room in stable condition.

## 2024-01-14 NOTE — Anesthesia Procedure Notes (Signed)
 Procedure Name: Intubation Date/Time: 01/14/2024 3:27 PM  Performed by: Einar Grad, CRNAPre-anesthesia Checklist: Patient identified, Emergency Drugs available, Suction available and Patient being monitored Patient Re-evaluated:Patient Re-evaluated prior to induction Oxygen Delivery Method: Circle System Utilized Preoxygenation: Pre-oxygenation with 100% oxygen Induction Type: IV induction Ventilation: Mask ventilation without difficulty Laryngoscope Size: Mac and 4 Grade View: Grade I Tube type: Oral Number of attempts: 1 Airway Equipment and Method: Stylet and Oral airway Placement Confirmation: ETT inserted through vocal cords under direct vision, positive ETCO2 and breath sounds checked- equal and bilateral Secured at: 23 cm Tube secured with: Tape Dental Injury: Teeth and Oropharynx as per pre-operative assessment

## 2024-01-14 NOTE — H&P (Signed)
 Albert Eaton is an 58 y.o. male.   Chief Complaint: Sleep apnea HPI: 57 year old male with sleep apnea who has been unable to tolerate CPAP.  Past Medical History:  Diagnosis Date   Anemia    Aortic atherosclerosis (HCC)    Arthritis    Ascending aorta dilation (HCC) 02/17/2023   Limited TTE 02/17/23: no effusion, s/p MV repair w trivial residual MR, asc aorta 39 mm, mod TR, severe LAE   Asthma    Cancer (HCC)    renal cyst   Chronic diastolic CHF (congestive heart failure) (HCC) 01/06/2017   Echo 7/16 [facility] in [place], [place]) Mild AI, mild LAE, mild concentric LVH, EF 55, normal wall motion, mild to moderate MR, mild PI, RVSP 55 // Echo 10/09/16 ([facility]):  Moderate LVH, grade 2 diastolic dysfunction, mild MR, moderate LAE    Dysrhythmia    paroxysmal afib/flutter   Esophagitis    ESRD (end stage renal disease) on dialysis [facility])    Dialysis Mon Wed Fri   GERD (gastroesophageal reflux disease)    Gout    no current problems   Heart murmur    "had it since he was a baby"- pt had echo 10/01/23   Hepatitis    Hep B   HIV (human immunodeficiency virus infection) (HCC)    HLD (hyperlipidemia)    Hypertension    Hypothyroidism    Mitral regurgitation    Myocardial infarction (HCC)    Neuropathy    Sleep apnea    does not use c-pap   Stroke [facility])    Wears glasses    Wears partial dentures     Past Surgical History:  Procedure Laterality Date   AV FISTULA PLACEMENT Left 07/02/2018   Procedure: Creation of Left arm BRACHIOBASILIC ARTERIOVENOUS  FISTULA;  Surgeon: Cephus Shelling, MD;  Location: [facility] OR;  Service: Vascular;  Laterality: Left;   BASCILIC VEIN TRANSPOSITION Left 08/27/2018   Procedure: Left arm BRACHIOBASILIC VEIN TRANSPOSITION SECOND STAGE;  Surgeon: Cephus Shelling, MD;  Location: [facility] OR;  Service: Vascular;  Laterality: Left;   BUBBLE STUDY  09/03/2020   Procedure: BUBBLE STUDY;  Surgeon: Pricilla Riffle, MD;  Location: [facility] ENDOSCOPY;  Service:  Cardiovascular;;   BUBBLE STUDY  06/03/2022   Procedure: BUBBLE STUDY;  Surgeon: Parke Poisson, MD;  Location: [facility] ENDOSCOPY;  Service: Cardiology;;   CAPD INSERTION N/A 05/30/2021   Procedure: LAPAROSCOPIC INSERTION CONTINUOUS AMBULATORY PERITONEAL DIALYSIS  (CAPD) CATHETER;  Surgeon: Leonie Douglas, MD;  Location: [facility] OR;  Service: Vascular;  Laterality: N/A;   CAPD REMOVAL N/A 02/13/2022   Procedure: PERITONEAL DIALYSIS CATHETER REMOVAL;  Surgeon: Leonie Douglas, MD;  Location: [facility] OR;  Service: Vascular;  Laterality: N/A;   CHOLECYSTECTOMY     COLONOSCOPY W/ BIOPSIES AND POLYPECTOMY     DRUG INDUCED ENDOSCOPY Bilateral 07/06/2023   Procedure: DRUG INDUCED SLEEP ENDOSCOPY;  Surgeon: Christia Reading, MD;  Location: [facility] OR;  Service: ENT;  Laterality: Bilateral;   GIVENS CAPSULE STUDY N/A 03/01/2018   Procedure: GIVENS CAPSULE STUDY;  Surgeon: Beverley Fiedler, MD;  Location: [facility] ENDOSCOPY;  Service: Gastroenterology;  Laterality: N/A;   HERNIA REPAIR     As baby   LEFT HEART CATH AND CORONARY ANGIOGRAPHY N/A 09/11/2022   Procedure: LEFT HEART CATH AND CORONARY ANGIOGRAPHY;  Surgeon: Corky Crafts, MD;  Location: [facility] INVASIVE CV LAB;  Service: Cardiovascular;  Laterality: N/A;   MITRAL VALVE REPAIR N/A 08/21/2022   Procedure:  MINIMALLY INVASIVE MITRAL VALVE REPAIR USING RING ANNULOPLASTY SIMULUS 30mm;  Surgeon: Eugenio Hoes, MD;  Location: [facility] OR;  Service: Open Heart Surgery;  Laterality: N/A;   MULTIPLE TOOTH EXTRACTIONS     NEPHRECTOMY Left 02/18/2021   NOSE SURGERY     RENAL BIOPSY     RIGHT/LEFT HEART CATH AND CORONARY ANGIOGRAPHY N/A 09/04/2021   Procedure: RIGHT/LEFT HEART CATH AND CORONARY ANGIOGRAPHY;  Surgeon: Kathleene Hazel, MD;  Location: [facility] INVASIVE CV LAB;  Service: Cardiovascular;  Laterality: N/A;   RIGHT/LEFT HEART CATH AND CORONARY ANGIOGRAPHY N/A 08/01/2022   Procedure: RIGHT/LEFT HEART CATH AND CORONARY ANGIOGRAPHY;  Surgeon: Tonny Bollman, MD;  Location: [facility]  INVASIVE CV LAB;  Service: Cardiovascular;  Laterality: N/A;   TEE WITHOUT CARDIOVERSION N/A 09/03/2020   Procedure: TRANSESOPHAGEAL ECHOCARDIOGRAM (TEE);  Surgeon: Pricilla Riffle, MD;  Location: [facility] ENDOSCOPY;  Service: Cardiovascular;  Laterality: N/A;   TEE WITHOUT CARDIOVERSION N/A 06/03/2022   Procedure: TRANSESOPHAGEAL ECHOCARDIOGRAM (TEE);  Surgeon: Parke Poisson, MD;  Location: [facility] ENDOSCOPY;  Service: Cardiology;  Laterality: N/A;   TEE WITHOUT CARDIOVERSION N/A 08/21/2022   Procedure: TRANSESOPHAGEAL ECHOCARDIOGRAM (TEE);  Surgeon: Eugenio Hoes, MD;  Location[facility] OR;  Service: Open Heart Surgery;  Laterality: N/A;   UPPER GI ENDOSCOPY  04/05/2021    Family History  Problem Relation Age of Onset   Hypertension Mother    Heart failure Mother    Hypertension Father    Alcohol abuse Father    Hypertension Sister    Multiple sclerosis Sister    Hypertension Sister    Hypertension Brother    Heart attack Maternal Grandmother 81   Scoliosis Other    Allergic rhinitis Neg Hx    Angioedema Neg Hx    Asthma Neg Hx    Eczema Neg Hx    Immunodeficiency Neg Hx    Urticaria Neg Hx    Colon cancer Neg Hx    Pancreatic cancer Neg Hx    Esophageal cancer Neg Hx    Social History:  reports that he quit smoking about 25 years ago. His smoking use included cigarettes. He started smoking about 43 years ago. He has a 18 pack-year smoking history. He has never used smokeless tobacco. He reports current alcohol use. He reports that he does not use drugs.  Allergies:  Allergies  Allergen Reactions   Ace Inhibitors Cough and Other (See Comments)    Medications Prior to Admission  Medication Sig Dispense Refill   allopurinol (ZYLOPRIM) 100 MG tablet Take 1 tablet (100 mg total) by mouth daily. 90 tablet 0   amLODipine (NORVASC) 2.5 MG tablet Take 1 tablet (2.5 mg total) by mouth daily. 90 tablet 1   azelastine (ASTELIN) 0.1 % nasal spray Place 2 sprays into both nostrils 2 (two) times  daily as needed for allergies. 30 mL 0   B Complex-C-Zn-Folic Acid (DIALYVITE 800/ZINC PO) Take 1 tablet by mouth daily.     bictegravir-emtricitabine-tenofovir AF (BIKTARVY) 50-200-25 MG TABS tablet Take 1 tablet by mouth daily. 30 tablet 11   cetirizine (ZYRTEC) 10 MG tablet TAKE ONE TABLET BY MOUTH DAILY AT 9 a.m. 90 tablet 1   cinacalcet (SENSIPAR) 90 MG tablet Take 90 mg by mouth daily.     famotidine (PEPCID) 20 MG tablet Take 1 tablet (20 mg total) by mouth 2 (two) times daily. 180 tablet 3   ferric citrate (AURYXIA) 1 GM 210 MG(Fe) tablet Take 420 mg by mouth See admin instructions. Take 420 mg by  mouth three times a day with meals and 210 mg with each snack     ipratropium (ATROVENT) 0.06 % nasal spray USE TWO SPRAYS IN EACH NOSTRIL 2-3 times daily AS NEEDED 15 mL 5   iron sucrose (VENOFER) 20 MG/ML injection Inject into the vein 3 (three) times a week.     levalbuterol (XOPENEX HFA) 45 MCG/ACT inhaler Inhale 2 puffs into the lungs every 4 (four) hours as needed for wheezing. 15 g 1   levothyroxine (SYNTHROID) 25 MCG tablet Take 1 tablet (25 mcg total) by mouth daily before breakfast. 90 tablet 1   lubiprostone (AMITIZA) 24 MCG capsule Take 1 capsule (24 mcg total) by mouth 2 (two) times daily with a meal. NEEDS APPT FOR FURTHER REFILLS 180 capsule 0   Methoxy PEG-Epoetin Beta (MIRCERA IJ) every 30 (thirty) days.     metoprolol tartrate (LOPRESSOR) 25 MG tablet Take 1 tablet (25 mg total) by mouth 2 (two) times daily. 90 tablet 3   pantoprazole (PROTONIX) 40 MG tablet Take 1 tablet (40 mg total) by mouth daily. 30 tablet 5   rOPINIRole (REQUIP) 1 MG tablet Take 1 tablet (1 mg total) by mouth at bedtime. 90 tablet 2   rosuvastatin (CRESTOR) 10 MG tablet Take 1 tablet (10 mg total) by mouth daily. 90 tablet 1   Vitamin D, Ergocalciferol, (DRISDOL) 1.25 MG (50000 UNIT) CAPS capsule Take 1 capsule (50,000 Units total) by mouth every Tuesday. 12 capsule 0   diclofenac Sodium (VOLTAREN) 1 % GEL  Apply 2 g topically 4 (four) times daily. Apply to chest pain when lidocaine patch not in place (Patient taking differently: Apply 2 g topically 4 (four) times daily as needed (pain).) 150 g 0   fluticasone (FLOVENT HFA) 110 MCG/ACT inhaler Inhale 2 puffs into the lungs 2 (two) times daily as needed ("for flares").     gabapentin (NEURONTIN) 300 MG capsule Take 1 capsule (300 mg total) by mouth daily as needed (pain/neuropathy).     lactulose (CHRONULAC) 10 GM/15ML solution Take 45 mLs (30 g total) by mouth daily as needed. Take 15-30 ml up to twice daily for constipation (Patient taking differently: Take 40 g by mouth daily as needed.) 1800 mL 11   Olopatadine HCl (PATADAY) 0.7 % SOLN Place 1 drop into both eyes 2 (two) times daily as needed (allergies). 2.5 mL 5   sevelamer carbonate (RENVELA) 800 MG tablet Take 2,400 mg by mouth daily.     Spacer/Aero-Holding Chambers DEVI 1 Device by Does not apply route 2 (two) times a day. 1 each 1   SYMBICORT 160-4.5 MCG/ACT inhaler Inhale 2 puffs into the lungs in the morning and at bedtime. (Patient taking differently: Inhale 2 puffs into the lungs 2 (two) times daily as needed (Asthma).) 10.2 g 5   tiZANidine (ZANAFLEX) 2 MG tablet Take 1 tablet (2 mg total) by mouth 2 (two) times daily as needed for muscle spasms. 30 tablet 0   warfarin (COUMADIN) 5 MG tablet Take 1 tablet to 2 tablets by mouth daily as prescribed by coumadin clinic (Patient taking differently: Take 5-10 mg by mouth See admin instructions. Take 1 tablet (5mg ) on Monday Wednesday Friday. Take 2 tablets (10mg ) on Tuesday Thursday Saturday & Sunday.) 140 tablet 0    Results for orders placed or performed during the hospital encounter of 01/14/24 (from the past 48 hours)  I-STAT, chem 8     Status: Abnormal   Collection Time: 01/14/24  1:26 PM  Result Value Ref  Range   Sodium 136 135 - 145 mmol/L   Potassium 3.7 3.5 - 5.1 mmol/L   Chloride 98 98 - 111 mmol/L   BUN 35 (H) 6 - 20 mg/dL    Creatinine, Ser 84.69 (H) 0.61 - 1.24 mg/dL   Glucose, Bld 90 70 - 99 mg/dL    Comment: Glucose reference range applies only to samples taken after fasting for at least 8 hours.   Calcium, Ion 0.98 (L) 1.15 - 1.40 mmol/L   TCO2 30 22 - 32 mmol/L   Hemoglobin 11.6 (L) 13.0 - 17.0 g/dL   HCT 62.9 (L) 52.8 - 41.3 %   *Note: Due to a large number of results and/or encounters for the requested time period, some results have not been displayed. A complete set of results can be found in Results Review.   No results found.  Review of Systems  All other systems reviewed and are negative.   Blood pressure 127/88, pulse 76, temperature 98.5 F (36.9 C), temperature source Oral, resp. rate 18, height 5\' 10"  (1.778 m), weight 90.7 kg, SpO2 98%. Physical Exam Constitutional:      Appearance: Normal appearance.  HENT:     Head: Normocephalic and atraumatic.     Right Ear: External ear normal.     Left Ear: External ear normal.     Nose: Nose normal.     Mouth/Throat:     Mouth: Mucous membranes are moist.     Pharynx: Oropharynx is clear.  Eyes:     Extraocular Movements: Extraocular movements intact.     Pupils: Pupils are equal, round, and reactive to light.  Cardiovascular:     Rate and Rhythm: Normal rate.  Pulmonary:     Effort: Pulmonary effort is normal.  Skin:    General: Skin is warm and dry.  Neurological:     General: No focal deficit present.     Mental Status: He is alert and oriented to person, place, and time.  Psychiatric:        Mood and Affect: Mood normal.        Behavior: Behavior normal.        Thought Content: Thought content normal.        Judgment: Judgment normal.      Assessment/Plan Obstructive sleep apnea and BMI 28.70  To OR for hypoglossal nerve stimulator placement.  Christia Reading, MD 01/14/2024, 2:25 PM

## 2024-01-14 NOTE — Anesthesia Postprocedure Evaluation (Signed)
 Anesthesia Post Note  Patient: Loren Sawaya Primm  Procedure(s) Performed: RIGHT IMPLANTATION OF HYPOGLOSSAL NERVE STIMULATOR (Right: Chest)     Patient location during evaluation: PACU Anesthesia Type: General Level of consciousness: awake and alert Pain management: pain level controlled Vital Signs Assessment: post-procedure vital signs reviewed and stable Respiratory status: spontaneous breathing, nonlabored ventilation, respiratory function stable and patient connected to nasal cannula oxygen Cardiovascular status: blood pressure returned to baseline and stable Postop Assessment: no apparent nausea or vomiting Anesthetic complications: no  No notable events documented.  Last Vitals:  Vitals:   01/14/24 1730 01/14/24 1745  BP: 129/88 118/86  Pulse: 71 70  Resp: 12 19  Temp:    SpO2: 98% 98%    Last Pain:  Vitals:   01/14/24 1745  TempSrc:   PainSc: 0-No pain                 Shelton Silvas

## 2024-01-15 ENCOUNTER — Encounter (HOSPITAL_COMMUNITY): Payer: Self-pay | Admitting: Otolaryngology

## 2024-01-15 DIAGNOSIS — T8249XD Other complication of vascular dialysis catheter, subsequent encounter: Secondary | ICD-10-CM | POA: Diagnosis not present

## 2024-01-15 DIAGNOSIS — N2581 Secondary hyperparathyroidism of renal origin: Secondary | ICD-10-CM | POA: Diagnosis not present

## 2024-01-15 DIAGNOSIS — D509 Iron deficiency anemia, unspecified: Secondary | ICD-10-CM | POA: Diagnosis not present

## 2024-01-15 DIAGNOSIS — D689 Coagulation defect, unspecified: Secondary | ICD-10-CM | POA: Diagnosis not present

## 2024-01-15 DIAGNOSIS — N186 End stage renal disease: Secondary | ICD-10-CM | POA: Diagnosis not present

## 2024-01-15 DIAGNOSIS — E876 Hypokalemia: Secondary | ICD-10-CM | POA: Diagnosis not present

## 2024-01-15 DIAGNOSIS — Z992 Dependence on renal dialysis: Secondary | ICD-10-CM | POA: Diagnosis not present

## 2024-01-15 DIAGNOSIS — D631 Anemia in chronic kidney disease: Secondary | ICD-10-CM | POA: Diagnosis not present

## 2024-01-18 ENCOUNTER — Other Ambulatory Visit: Payer: Self-pay

## 2024-01-18 ENCOUNTER — Telehealth: Payer: Self-pay | Admitting: *Deleted

## 2024-01-18 ENCOUNTER — Ambulatory Visit: Attending: Nurse Practitioner | Admitting: Nurse Practitioner

## 2024-01-18 ENCOUNTER — Encounter (HOSPITAL_COMMUNITY): Payer: Self-pay | Admitting: Vascular Surgery

## 2024-01-18 DIAGNOSIS — D509 Iron deficiency anemia, unspecified: Secondary | ICD-10-CM | POA: Diagnosis not present

## 2024-01-18 DIAGNOSIS — Z992 Dependence on renal dialysis: Secondary | ICD-10-CM | POA: Diagnosis not present

## 2024-01-18 DIAGNOSIS — Z01810 Encounter for preprocedural cardiovascular examination: Secondary | ICD-10-CM

## 2024-01-18 DIAGNOSIS — D689 Coagulation defect, unspecified: Secondary | ICD-10-CM | POA: Diagnosis not present

## 2024-01-18 DIAGNOSIS — E876 Hypokalemia: Secondary | ICD-10-CM | POA: Diagnosis not present

## 2024-01-18 DIAGNOSIS — D631 Anemia in chronic kidney disease: Secondary | ICD-10-CM | POA: Diagnosis not present

## 2024-01-18 DIAGNOSIS — N186 End stage renal disease: Secondary | ICD-10-CM | POA: Diagnosis not present

## 2024-01-18 DIAGNOSIS — N2581 Secondary hyperparathyroidism of renal origin: Secondary | ICD-10-CM | POA: Diagnosis not present

## 2024-01-18 DIAGNOSIS — T8249XD Other complication of vascular dialysis catheter, subsequent encounter: Secondary | ICD-10-CM | POA: Diagnosis not present

## 2024-01-18 NOTE — Progress Notes (Signed)
 PCP - [name], NP  Cardiologist - Tonny Bollman, MD Electrophysiology - Cardiology Lanier Prude, MD   PPM/ICD - denies Device Orders - n/a Rep Notified - n/a  Chest x-ray - 12-31-23 EKG - 12-31-23 Stress Test - 10-01-23 ECHO - 10-01-23 Cardiac Cath - 09-11-22  CPAP - does not use but in process of getting inspire for OSA  DM - denies  Blood Thinner Instructions: warfarin (COUMADIN)  LAST DOSE 01-08-24 Aspirin Instructions: n/a  ERAS Protcol - NPO  COVID TEST- n/a  Anesthesia review: no  Patient verbally denies any shortness of breath, fever, cough and chest pain during phone call   -------------  SDW INSTRUCTIONS given:  Your procedure is scheduled on Seats 26, 2025.  Report to [facility] Main Entrance "A" at 5:30 A.M., and check in at the Admitting office.  Call this number if you have problems the morning of surgery:  [phone]   Remember:  Do not eat after midnight the night before your surgery      Take these medicines the morning of surgery with A SIP OF WATER  allopurinol (ZYLOPRIM)  amLODipine (NORVASC)  azelastine (ASTELIN)  bictegravir-emtricitabine-tenofovir AF (BIKTARVY)  cetirizine (ZYRTEC)  cinacalcet (SENSIPAR)  famotidine (PEPCID)  fluticasone (FLOVENT HFA)  gabapentin (NEURONTIN)  HYDROcodone-acetaminophen (NORCO/VICODIN)  ipratropium (ATROVENT)  levothyroxine (SYNTHROID)  levalbuterol (XOPENEX HFA)  metoprolol tartrate (LOPRESSOR)  Olopatadine HCl (PATADAY)  pantoprazole (PROTONIX)  rosuvastatin (CRESTOR)  SYMBICORT  tiZANidine (ZANAFLEX)   As of today, STOP taking any Aspirin (unless otherwise instructed by your surgeon) Aleve, Naproxen, Ibuprofen, Motrin, Advil, Goody's, BC's, all herbal medications, fish oil, and all vitamins. THIS INCLUDES YOUR diclofenac Sodium (VOLTAREN).                      Do not wear jewelry, make up, or nail polish            Do not wear lotions, powders, perfumes/colognes, or deodorant.             Do not shave 48 hours prior to surgery.  Men may shave face and neck.            Do not bring valuables to the hospital.            [facility] is not responsible for any belongings or valuables.  Do NOT Smoke (Tobacco/Vaping) 24 hours prior to your procedure If you use a CPAP at night, you may bring all equipment for your overnight stay.   Contacts, glasses, dentures or bridgework may not be worn into surgery.      For patients admitted to the hospital, discharge time will be determined by your treatment team.   Patients discharged the day of surgery will not be allowed to drive home, and someone needs to stay with them for 24 hours.    Special instructions:   [place]- Preparing For Surgery  Before surgery, you can play an important role. Because skin is not sterile, your skin needs to be as free of germs as possible. You can reduce the number of germs on your skin by washing with CHG (chlorahexidine gluconate) Soap before surgery.  CHG is an antiseptic cleaner which kills germs and bonds with the skin to continue killing germs even after washing.    Oral Hygiene is also important to reduce your risk of infection.  Remember - BRUSH YOUR TEETH THE MORNING OF SURGERY WITH YOUR REGULAR TOOTHPASTE  Please do not use if you have  an allergy to CHG or antibacterial soaps. If your skin becomes reddened/irritated stop using the CHG.  Do not shave (including legs and underarms) for at least 48 hours prior to first CHG shower. It is OK to shave your face.  Please follow these instructions carefully.   Shower the NIGHT BEFORE SURGERY and the MORNING OF SURGERY with DIAL Soap.   Pat yourself dry with a CLEAN TOWEL.  Wear CLEAN PAJAMAS to bed the night before surgery  Place CLEAN SHEETS on your bed the night of your first shower and DO NOT SLEEP WITH PETS.   Day of Surgery: Please shower morning of surgery  Wear Clean/Comfortable clothing the morning of surgery Do not apply any  deodorants/lotions.   Remember to brush your teeth WITH YOUR REGULAR TOOTHPASTE.   Questions were answered. Patient verbalized understanding of instructions.

## 2024-01-18 NOTE — Progress Notes (Signed)
 Virtual Visit via Telephone Note   Because of Albert Eaton co-morbid illnesses, he is at least at moderate risk for complications without adequate follow up.  This format is felt to be most appropriate for this patient at this time.  Due to technical limitations with video connection (technology), today's appointment will be conducted as an audio only telehealth visit, and Albert Eaton verbally agreed to proceed in this manner.   All issues noted in this document were discussed and addressed.  No physical exam could be performed with this format.  Evaluation Performed:  Preoperative cardiovascular risk assessment _____________   Date:  01/18/2024   Patient ID:  Albert Eaton, DOB 20-Sep-1966, MRN [id number] Patient Location:  Home Provider location:   Office  Primary Care Provider:  [name], NP Primary Cardiologist:  Tonny Bollman, MD  Chief Complaint / Patient Profile   58 y.o. y/o male with a h/o nonobstructive CAD, chronic HFpEF, PAF on Coumadin, mitral valve regurgitation s/p MV repair, history of PEA arrest, ESRD on HD, hypertension, hyperlipidemia, OSA who is pending AV fistula and insertion of tunnel dialysis catheter with Dr. Chestine Spore on 01/20/24 and presents today for telephonic preoperative cardiovascular risk assessment.  History of Present Illness    Albert Eaton is a 58 y.o. male who presents via audio/video conferencing for a telehealth visit today.  Pt was last seen in cardiology clinic on 10/29/2023 by Dr. Excell Seltzer.  At that time Albert Eaton was doing well.  The patient is now pending procedure as outlined above. Since his last visit, he denies chest pain, shortness of breath, lower extremity edema, fatigue, palpitations, melena, hematuria, hemoptysis, diaphoresis, weakness, presyncope, syncope, orthopnea, and PND. He reports getting dizzy after blowing his nose but was able to ease himself down to the floor. No further episodes since that time.  He remains  active and is able to achieve > 4 METS activity without concerning cardiac symptoms.  Past Medical History    Past Medical History:  Diagnosis Date   Anemia    Aortic atherosclerosis (HCC)    Arthritis    Ascending aorta dilation (HCC) 02/17/2023   Limited TTE 02/17/23: no effusion, s/p MV repair w trivial residual MR, asc aorta 39 mm, mod TR, severe LAE   Asthma    Cancer (HCC)    renal cyst   Chronic diastolic CHF (congestive heart failure) (HCC) 01/06/2017   Echo 7/16 [facility] in [place], [place]) Mild AI, mild LAE, mild concentric LVH, EF 55, normal wall motion, mild to moderate MR, mild PI, RVSP 55 // Echo 10/09/16 ([facility]):  Moderate LVH, grade 2 diastolic dysfunction, mild MR, moderate LAE    Dysrhythmia    paroxysmal afib/flutter   Esophagitis    ESRD (end stage renal disease) on dialysis [facility])    Dialysis Mon Wed Fri   GERD (gastroesophageal reflux disease)    Gout    no current problems   Heart murmur    "had it since he was a baby"- pt had echo 10/01/23   Hepatitis    Hep B   HIV (human immunodeficiency virus infection) (HCC)    HLD (hyperlipidemia)    Hypertension    Hypothyroidism    Mitral regurgitation    Myocardial infarction (HCC)    Neuropathy    Sleep apnea    does not use c-pap   Stroke [facility])    Wears glasses    Wears partial dentures    Past Surgical History:  Procedure Laterality Date   AV FISTULA PLACEMENT Left 07/02/2018   Procedure: Creation of Left arm BRACHIOBASILIC ARTERIOVENOUS  FISTULA;  Surgeon: Cephus Shelling, MD;  Location: [facility] OR;  Service: Vascular;  Laterality: Left;   BASCILIC VEIN TRANSPOSITION Left 08/27/2018   Procedure: Left arm BRACHIOBASILIC VEIN TRANSPOSITION SECOND STAGE;  Surgeon: Cephus Shelling, MD;  Location: [facility] OR;  Service: Vascular;  Laterality: Left;   BUBBLE STUDY  09/03/2020   Procedure: BUBBLE STUDY;  Surgeon: Pricilla Riffle, MD;  Location: [facility] ENDOSCOPY;  Service: Cardiovascular;;   BUBBLE STUDY   06/03/2022   Procedure: BUBBLE STUDY;  Surgeon: Parke Poisson, MD;  Location: [facility] ENDOSCOPY;  Service: Cardiology;;   CAPD INSERTION N/A 05/30/2021   Procedure: LAPAROSCOPIC INSERTION CONTINUOUS AMBULATORY PERITONEAL DIALYSIS  (CAPD) CATHETER;  Surgeon: Leonie Douglas, MD;  Location: [facility] OR;  Service: Vascular;  Laterality: N/A;   CAPD REMOVAL N/A 02/13/2022   Procedure: PERITONEAL DIALYSIS CATHETER REMOVAL;  Surgeon: Leonie Douglas, MD;  Location: [facility] OR;  Service: Vascular;  Laterality: N/A;   CHOLECYSTECTOMY     COLONOSCOPY W/ BIOPSIES AND POLYPECTOMY     DRUG INDUCED ENDOSCOPY Bilateral 07/06/2023   Procedure: DRUG INDUCED SLEEP ENDOSCOPY;  Surgeon: Christia Reading, MD;  Location: [facility] OR;  Service: ENT;  Laterality: Bilateral;   GIVENS CAPSULE STUDY N/A 03/01/2018   Procedure: GIVENS CAPSULE STUDY;  Surgeon: Beverley Fiedler, MD;  Location: [facility] ENDOSCOPY;  Service: Gastroenterology;  Laterality: N/A;   HERNIA REPAIR     As baby   IMPLANTATION OF HYPOGLOSSAL NERVE STIMULATOR Right 01/14/2024   Procedure: RIGHT IMPLANTATION OF HYPOGLOSSAL NERVE STIMULATOR;  Surgeon: Christia Reading, MD;  Location: [facility] OR;  Service: ENT;  Laterality: Right;   LEFT HEART CATH AND CORONARY ANGIOGRAPHY N/A 09/11/2022   Procedure: LEFT HEART CATH AND CORONARY ANGIOGRAPHY;  Surgeon: Corky Crafts, MD;  Location: [facility] INVASIVE CV LAB;  Service: Cardiovascular;  Laterality: N/A;   MITRAL VALVE REPAIR N/A 08/21/2022   Procedure: MINIMALLY INVASIVE MITRAL VALVE REPAIR USING RING ANNULOPLASTY SIMULUS 30mm;  Surgeon: Eugenio Hoes, MD;  Location: [facility] OR;  Service: Open Heart Surgery;  Laterality: N/A;   MULTIPLE TOOTH EXTRACTIONS     NEPHRECTOMY Left 02/18/2021   NOSE SURGERY     RENAL BIOPSY     RIGHT/LEFT HEART CATH AND CORONARY ANGIOGRAPHY N/A 09/04/2021   Procedure: RIGHT/LEFT HEART CATH AND CORONARY ANGIOGRAPHY;  Surgeon: Kathleene Hazel, MD;  Location: [facility] INVASIVE CV LAB;  Service: Cardiovascular;  Laterality:  N/A;   RIGHT/LEFT HEART CATH AND CORONARY ANGIOGRAPHY N/A 08/01/2022   Procedure: RIGHT/LEFT HEART CATH AND CORONARY ANGIOGRAPHY;  Surgeon: Tonny Bollman, MD;  Location: [facility] INVASIVE CV LAB;  Service: Cardiovascular;  Laterality: N/A;   TEE WITHOUT CARDIOVERSION N/A 09/03/2020   Procedure: TRANSESOPHAGEAL ECHOCARDIOGRAM (TEE);  Surgeon: Pricilla Riffle, MD;  Location: [facility] ENDOSCOPY;  Service: Cardiovascular;  Laterality: N/A;   TEE WITHOUT CARDIOVERSION N/A 06/03/2022   Procedure: TRANSESOPHAGEAL ECHOCARDIOGRAM (TEE);  Surgeon: Parke Poisson, MD;  Location: [facility] ENDOSCOPY;  Service: Cardiology;  Laterality: N/A;   TEE WITHOUT CARDIOVERSION N/A 08/21/2022   Procedure: TRANSESOPHAGEAL ECHOCARDIOGRAM (TEE);  Surgeon: Eugenio Hoes, MD;  Location: [facility] OR;  Service: Open Heart Surgery;  Laterality: N/A;   UPPER GI ENDOSCOPY  04/05/2021    Allergies  Allergies  Allergen Reactions   Ace Inhibitors Cough and Other (See Comments)    Home Medications    Prior to Admission medications   Medication Sig Start Date  End Date Taking? Authorizing Provider  levalbuterol [facility] HFA) 45 MCG/ACT inhaler Inhale 2 puffs into the lungs every 4 (four) hours as needed for wheezing. 01/08/24   Alfonse Spruce, MD  allopurinol (ZYLOPRIM) 100 MG tablet Take 1 tablet (100 mg total) by mouth daily. 10/23/23   [name], NP  amLODipine (NORVASC) 2.5 MG tablet Take 1 tablet (2.5 mg total) by mouth daily. 10/19/23   Tonny Bollman, MD  azelastine (ASTELIN) 0.1 % nasal spray Place 2 sprays into both nostrils 2 (two) times daily as needed for allergies. 04/01/23   Alfonse Spruce, MD  B Complex-C-Zn-Folic Acid (DIALYVITE 800/ZINC PO) Take 1 tablet by mouth daily. 12/14/23   [provider]  bictegravir-emtricitabine-tenofovir AF (BIKTARVY) 50-200-25 MG TABS tablet Take 1 tablet by mouth daily. 07/28/23   Gardiner Barefoot, MD  cetirizine (ZYRTEC) 10 MG tablet TAKE ONE TABLET BY MOUTH DAILY AT 9 a.m.  12/18/23   Alfonse Spruce, MD  cinacalcet (SENSIPAR) 90 MG tablet Take 90 mg by mouth daily. 10/08/23   [provider]  diclofenac Sodium (VOLTAREN) 1 % GEL Apply 2 g topically 4 (four) times daily. Apply to chest pain when lidocaine patch not in place Patient taking differently: Apply 2 g topically 4 (four) times daily as needed (pain). 09/14/22   Tyrone Nine, MD  famotidine (PEPCID) 20 MG tablet Take 1 tablet (20 mg total) by mouth 2 (two) times daily. 11/13/22   Alfonse Spruce, MD  ferric citrate (AURYXIA) 1 GM 210 MG(Fe) tablet Take 420 mg by mouth See admin instructions. Take 420 mg by mouth three times a day with meals and 210 mg with each snack    [provider]  fluticasone (FLOVENT HFA) 110 MCG/ACT inhaler Inhale 2 puffs into the lungs 2 (two) times daily as needed ("for flares").    [provider]  gabapentin (NEURONTIN) 300 MG capsule Take 1 capsule (300 mg total) by mouth daily as needed (pain/neuropathy). 09/14/22   Tyrone Nine, MD  HYDROcodone-acetaminophen (NORCO/VICODIN) 5-325 MG tablet Take 1-2 tablets by mouth every 6 (six) hours as needed. 01/14/24 01/13/25  Christia Reading, MD  ipratropium (ATROVENT) 0.06 % nasal spray USE TWO SPRAYS IN EACH NOSTRIL 2-3 times daily AS NEEDED 11/10/23   Alfonse Spruce, MD  iron sucrose (VENOFER) 20 MG/ML injection Inject into the vein 3 (three) times a week. 11/27/23 11/25/24  [provider]  lactulose (CHRONULAC) 10 GM/15ML solution Take 45 mLs (30 g total) by mouth daily as needed. Take 15-30 ml up to twice daily for constipation Patient taking differently: Take 40 g by mouth daily as needed. 01/08/23   Pyrtle, Carie Caddy, MD  levothyroxine (SYNTHROID) 25 MCG tablet Take 1 tablet (25 mcg total) by mouth daily before breakfast. 10/27/23   [name], NP  lubiprostone (AMITIZA) 24 MCG capsule Take 1 capsule (24 mcg total) by mouth 2 (two) times daily with a meal. NEEDS APPT FOR FURTHER REFILLS  10/19/23   Pyrtle, Carie Caddy, MD  Methoxy PEG-Epoetin Beta (MIRCERA IJ) every 30 (thirty) days. 11/27/23 11/25/24  [provider]  metoprolol tartrate (LOPRESSOR) 25 MG tablet Take 1 tablet (25 mg total) by mouth 2 (two) times daily. 11/04/23   Tonny Bollman, MD  Olopatadine HCl (PATADAY) 0.7 % SOLN Place 1 drop into both eyes 2 (two) times daily as needed (allergies). 11/13/22   Alfonse Spruce, MD  pantoprazole (PROTONIX) 40 MG tablet Take 1 tablet (40 mg total)  by mouth daily. 06/01/23   Alfonse Spruce, MD  rOPINIRole (REQUIP) 1 MG tablet Take 1 tablet (1 mg total) by mouth at bedtime. 10/26/23   Olalere, Onnie Boer A, MD  rosuvastatin (CRESTOR) 10 MG tablet Take 1 tablet (10 mg total) by mouth daily. 10/19/23   Tonny Bollman, MD  sevelamer carbonate (RENVELA) 800 MG tablet Take 2,400 mg by mouth daily. 04/22/23   [provider]  Spacer/Aero-Holding Deretha Emory DEVI 1 Device by Does not apply route 2 (two) times a day. 03/01/19   Bobbitt, Heywood Iles, MD  SYMBICORT 160-4.5 MCG/ACT inhaler Inhale 2 puffs into the lungs in the morning and at bedtime. Patient taking differently: Inhale 2 puffs into the lungs 2 (two) times daily as needed (Asthma). 07/01/23   Alfonse Spruce, MD  tiZANidine (ZANAFLEX) 2 MG tablet Take 1 tablet (2 mg total) by mouth 2 (two) times daily as needed for muscle spasms. 07/07/23   [name], NP  Vitamin D, Ergocalciferol, (DRISDOL) 1.25 MG (50000 UNIT) CAPS capsule Take 1 capsule (50,000 Units total) by mouth every Tuesday. 01/06/23   [name], NP  warfarin (COUMADIN) 5 MG tablet Take 1 tablet to 2 tablets by mouth daily as prescribed by coumadin clinic Patient taking differently: Take 5-10 mg by mouth See admin instructions. Take 1 tablet (5mg ) on Monday Wednesday Friday. Take 2 tablets (10mg ) on Tuesday Thursday Saturday & Sunday. 12/23/23   Tonny Bollman, MD    Physical Exam    Vital Signs:  Albert Eaton does not have vital signs  available for review today  Given telephonic nature of communication, physical exam is limited. AAOx3. NAD. Normal affect.  Speech and respirations are unlabored.  Accessory Clinical Findings    None  Assessment & Plan    1.  Preoperative Cardiovascular Risk Assessment: According to the Revised Cardiac Risk Index (RCRI), his Perioperative Risk of Major Cardiac Event is (%): 11. His Functional Capacity in METs is: 7.59 according to the Duke Activity Status Index (DASI). The patient is doing well from a cardiac perspective. Therefore, based on ACC/AHA guidelines, the patient would be at acceptable risk for the planned procedure without further cardiovascular testing.   The patient was advised that if he develops new symptoms prior to surgery to contact our office to arrange for a follow-up visit, and he verbalized understanding.  Per office protocol, patient can hold warfarin for procedures on 3/20 and 3/26 and resume afterwards. Patient will not need bridging with Lovenox (enoxaparin) around procedure.  A copy of this note will be routed to requesting surgeon.  Time:   Today, I have spent 10 minutes with the patient with telehealth technology discussing medical history, symptoms, and management plan.     Levi Aland, NP-C  01/18/2024, 2:26 PM [address], [address] Office [phone] Fax [phone]

## 2024-01-18 NOTE — Progress Notes (Signed)
 Anesthesia Chart Review:  Case: [id number] Date/Time: 01/20/24 0715   Procedures:      REVISON OF ARTERIOVENOUS FISTULA (Left)     INSERTION OF DIALYSIS CATHETER   Anesthesia type: Choice   Diagnosis: End stage renal disease (HCC) [N18.6]   Pre-op diagnosis: ESRD   [facility] / [facility]   Surgeons: Cephus Shelling, MD       DISCUSSION:  See Anesthesia APP PAT note from Date of Service 01/08/24. He is s/p right implantation of hypoglossal nerve stimulator on 01/14/24. He is now scheduled for AVF revision due to pseudoaneurysms.   History includes former smoker (quit 10/27/98), HTN, HLD, ESRD (PD catheter 05/30/21-02/13/22; HD [place] [facility] MWF, LUE AVF), renal cell cancer (s/p left nephrectomy 02/18/21), CAD (mild-moderate, nonobstructive 08/2022), chronic diastolic CHF, severe MR (s/p right mini-thoracotomy, MV repair using 30 mm annuloplasty ing 08/21/22; small left frontal lobe infarct on post-op CT), paroxysmal afib/aflutter (PAF 12/02/21; 2% aflutter burden 04/2023 monitor), HIV & chronic hepatitis B (treated with Biktarvy), anemia, GERD, hypothyroidism, neuropathy, OSA (intolerant to CPAP), dilated thoracic aorta (3.4 cm thoracic arch 03/13/23 CTA). S/p cardiac arrest 09/08/22 during hemodialysis, s/p epinephrine x2 and CPR, and calcium gluconate for PEA (SVT versus afib) with stable coronary anatomy by LHC.    He is followed by primary cardiologist Dr. Excell Seltzer and EP Dr. Lalla Brothers. He had mild-moderate CAD by 08/2022 LHC. He underwent MV repair (annuloplasty ring) on 08/21/22 via right thoracotomy approach. He had 2% aflutter burden in July 2024 and has placed on anticoagulation therapy. Limited TTE 01/2023 showed MV repair with trivial MR, moderate TR, no pericardial effusion. EF in 08/2022 was 45-50%. His last cardiology visit with Dr. Excell Seltzer was on 10/29/23. He was doing well fro ma cardiac standpoint. Reported listed for renal transplant at both [facility] and [facility] and preferred that he be  transitioned from apixaban to warfarin so they could reverse anticoagulation therapy if needed for surgery. He denied chest pain, SOB, edema, orthopnea, PND. He continued to get occasional palpations, but no increase in frequency. Volume status managed with dialysis. One year cardiology follow-up recommended.   Preoperative cardiology input outlined on 01/18/24 by Eligha Bridegroom, NP: " Preoperative Cardiovascular Risk Assessment: According to the Revised Cardiac Risk Index (RCRI), his Perioperative Risk of Major Cardiac Event is (%): 11. His Functional Capacity in METs is: 7.59 according to the Duke Activity Status Index (DASI). The patient is doing well from a cardiac perspective. Therefore, based on ACC/AHA guidelines, the patient would be at acceptable risk for the planned procedure without further cardiovascular testing...  Per office protocol, patient can hold warfarin for procedures on 3/20 and 3/26 and resume afterwards. Patient will not need bridging with Lovenox (enoxaparin) around procedure."  He reported last PT/INR was on 01/08/24--appears he did not resume after hypoglossal nerve stimulator placement. Last INR 1.2 on 01/14/24.   Anesthesia team to evaluate on the day of surgery. He will need labs on arrival given ESRD.     VS:  BP Readings from Last 3 Encounters:  01/14/24 (!) 136/92  01/07/24 (!) 140/97  01/05/24 (!) 158/97   Pulse Readings from Last 3 Encounters:  01/14/24 71  01/07/24 79  01/05/24 80       PROVIDERS: [name], NP is PCP  Tonny Bollman, MD is primary cardiologist Steffanie Dunn, MD is EP  Eugenio Hoes, MD is CT surgeon Erick Blinks, MD is GI Merwyn Katos, DO is ID Malachi Bonds, MD is allergist  Virl Diamond, MD is pulmonologist     LABS: Most recent lab results in [facility] include:  Lab Results  Component Value Date   WBC 4.6 01/07/2024   HGB 11.6 (L) 01/14/2024   HCT 34.0 (L) 01/14/2024   PLT 236 01/07/2024   GLUCOSE 90 01/14/2024    ALT 15 01/07/2024   AST 20 01/07/2024   NA 136 01/14/2024   K 3.7 01/14/2024   CL 98 01/14/2024   CREATININE 11.10 (H) 01/14/2024   BUN 35 (H) 01/14/2024   CO2 27 01/07/2024   INR 1.2 01/14/2024  He will need iSTAT on the day of surgery given ESRD. INR was 1.2 on 01/14/24. If he has not resumed then should not need a repeat level until warfarin resumed postoperatively.      IMAGES: 1V CXR and xray soft tissue neck 01/14/24:  FINDINGS: - Chest: Right-sided battery pack for a stimulator courses into the neck. No pneumothorax. Low lung volumes. Prominent heart size likely accentuated by technique. Mild patchy opacity in the left lung, favored to be atelectasis. No significant pleural effusion. - Neck: Stimulator tip in the submandibular region. No discontinuity. Expected postoperative changes in the soft tissues. IMPRESSION: 1. Right-sided hypoglossal stimulator tip in the submandibular region. No discontinuity. 2. Low lung volumes with mild patchy opacity in the left lung,favored to be atelectasis.   CT Abd/pelvis 12/14/23: IMPRESSION: 1. Mild perivesicular inflammatory stranding. Extensive perinephric stranding, slightly progressive since prior examination. Together, the findings may reflect changes of an infectious cystitis and ascending urinary tract infection/pyelonephritis. Correlation with urinalysis and urine culture may be helpful. 2. Small left pleural effusion, slightly increased since prior examination. 3. Mild global cardiomegaly. - Aortic Atherosclerosis (ICD10-I70.0).   CTA Chest 03/13/23: IMPRESSION: 1. No evidence of pulmonary embolism or other acute intrathoracic process. 2. Unable to assess for the possibility of aortic dissection on this exam timed for assessment of the pulmonary arterial tree. 3. Cardiomegaly with mildly dilated central pulmonary vasculature, suggestive of pulmonary arterial hypertension. 4. 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 Resorptive changes at the fracture margins with adjacent soft tissue thickening/fibrosis suggesting a pseudoarthrosis. 5. Healed fractures of multiple bilateral anterior ribs. 6. Thoracic aortic arch measures 3.4 cm in diameter. Recommend annual imaging followup by CTA or MRA. This recommendation follows 2010 ACCF/AHA/AATS/ACR/ASA/SCA/SCAI/SIR/STS/SVM Guidelines for the Diagnosis and Management of Patients with Thoracic Aortic Disease. Circulation.2010; 121: W098-J191. Aortic aneurysm NOS (ICD10-I71.9) 7. Aortic and coronary artery atherosclerosis (ICD10-I70.0).       EKG:  EKG 12/31/23: Atrial flutter at 121 bpm -Inferior ST-elevation -repolarization variant. ABNORMAL RHYTHM   EKG 12/14/23: Sinus rhythm Left atrial enlargement Left ventricular hypertrophy Prolonged QT interval Confirmed by Alvester Chou [phone]) on 12/14/2023 10:04:01 PM     CV: Long Term Zio Monitor 05/10/23 - 05/24/23: Patient had a min HR of 53 bpm, max HR of 193 bpm, and avg HR of 81 bpm. Predominant underlying rhythm was Sinus Rhythm. 1 run of Ventricular Tachycardia occurred lasting 4 beats with a max rate of 193 bpm (avg 182 bpm). Atrial Flutter occurred (2%  burden), ranging from 96-177 bpm (avg of 132 bpm), the longest lasting 33 mins 59 secs with an avg rate of 137 bpm. Atrial Flutter may be possible Atrial Tachycardia with variable block. Isolated SVEs were occasional (3.5%, 57397), SVE Couplets were  occasional (1.0%, 8279), and SVE Triplets were rare (<1.0%, 1481). Isolated VEs were occasional (2.4%, 38757), VE Couplets were rare (<1.0%, 2424), and VE Triplets were rare (<1.0%, 27).  Ventricular Bigeminy and Trigeminy were present. SUMMARY: Normal sinus with average HR of 81 bpm. Atrial flutter occurs with a 2% burden, average heart rate during atrial flutter is 137 bpm. Occasional PVC's with a burden of 2.4%.      Echo (Limited) 02/17/23: IMPRESSIONS   1. No pericardial effusion.   2. Left atrial  size was severely dilated.   3. Post mitral repair with trivial residual MR . The mitral valve has  been repaired/replaced. There is a 30 mm [employer] prosthetic annuloplasty  ring. present in the mitral position. Procedure Date: 07/2022.   4. Tricuspid valve regurgitation is moderate.   5. The aortic valve is tricuspid. Aortic valve regurgitation is not  visualized. No aortic stenosis is present.   6. Aortic dilatation noted. There is mild dilatation of the ascending  aorta, measuring 39 mm.  - Comparison: Limited TTE 09/09/22: LVEF 45-50%, LV global hypokinesis, moderate concentric LVH, moderate RVH, MV repair with trivial MR; Complete TTE 08/22/22: LVEF 45-50%, LV global hypokinesis, mild LVH, grade 2 diastolic dysfunction, interventricular septum flattening consistent with RV pressure and volume overload, mildly reduced RV systolic function, moderate RVH, moderately elevated PASP, estimated RVSP 50 mmHg, severely dilated LA/RA, MV repair, severe TR; TEE 06/03/22 negative bubble study for interatrial shunt     LHC 09/11/22:   Mid LAD lesion is 50% stenosed.   Prox LAD lesion is 50% stenosed.   Prox RCA lesion is 40% stenosed.   2nd Mrg lesion is 30% stenosed.   LV end diastolic pressure is mildly elevated.   There is no aortic valve stenosis. No change in coronary anatomy since October 2023.  Consider EP referral for consideration of AICD.       US Carotid 08/19/22: Summary:  - Right Carotid: The extracranial vessels were near-normal with only minimal  wall thickening or plaque.  - Left Carotid: The extracranial vessels were near-normal with only minimal  wall thickening or plaque.  - Vertebrals: Bilateral vertebral arteries demonstrate antegrade flow.      RHC/LHC 08/01/22: 1.  Mildly elevated pulmonary wedge pressure and PA pressure with mean wedge pressure of 17 mmHg and mean PA pressure of 25 mmHg, high cardiac output likely related to Fick measurement inpatient with AV  fistula 2.  Stable moderate nonobstructive coronary artery disease with patency of the left main, moderate 50% proximal LAD stenosis unchanged from the previous study, mild nonobstructive left circumflex stenosis (dominant vessel), and mild nonobstructive stenosis of a nondominant RCA Recommend: Continue evaluation per Dr. Leafy Ro with plans for surgical valve repair.     Past Medical History:  Diagnosis Date   Anemia    Aortic atherosclerosis (HCC)    Arthritis    Ascending aorta dilation (HCC) 02/17/2023   Limited TTE 02/17/23: no effusion, s/p MV repair w trivial residual MR, asc aorta 39 mm, mod TR, severe LAE   Asthma    Cancer (HCC)    renal cyst   Chronic diastolic CHF (congestive heart failure) (HCC) 01/06/2017   Echo 7/16 [facility] in [place], [place]) Mild AI, mild LAE, mild concentric LVH, EF 55, normal wall motion, mild to moderate MR, mild PI, RVSP 55 // Echo 10/09/16 ([facility]):  Moderate LVH, grade 2 diastolic dysfunction, mild MR, moderate LAE    Dysrhythmia    paroxysmal afib/flutter   Esophagitis    ESRD (end stage renal disease) on dialysis [facility])    Dialysis Mon Wed Fri   GERD (gastroesophageal reflux disease)    Gout  no current problems   Heart murmur    "had it since he was a baby"- pt had echo 10/01/23   Hepatitis    Hep B   HIV (human immunodeficiency virus infection) (HCC)    HLD (hyperlipidemia)    Hypertension    Hypothyroidism    Mitral regurgitation    Myocardial infarction (HCC)    Neuropathy    Sleep apnea    does not use c-pap   Stroke [facility])    Wears glasses    Wears partial dentures     Past Surgical History:  Procedure Laterality Date   AV FISTULA PLACEMENT Left 07/02/2018   Procedure: Creation of Left arm BRACHIOBASILIC ARTERIOVENOUS  FISTULA;  Surgeon: Cephus Shelling, MD;  Location[facility] OR;  Service: Vascular;  Laterality: Left;   BASCILIC VEIN TRANSPOSITION Left 08/27/2018   Procedure: Left arm BRACHIOBASILIC VEIN TRANSPOSITION  SECOND STAGE;  Surgeon: Cephus Shelling, MD;  Location: [facility] OR;  Service: Vascular;  Laterality: Left;   BUBBLE STUDY  09/03/2020   Procedure: BUBBLE STUDY;  Surgeon: Pricilla Riffle, MD;  Location: [facility] ENDOSCOPY;  Service: Cardiovascular;;   BUBBLE STUDY  06/03/2022   Procedure: BUBBLE STUDY;  Surgeon: Parke Poisson, MD;  Location: [facility] ENDOSCOPY;  Service: Cardiology;;   CAPD INSERTION N/A 05/30/2021   Procedure: LAPAROSCOPIC INSERTION CONTINUOUS AMBULATORY PERITONEAL DIALYSIS  (CAPD) CATHETER;  Surgeon: Leonie Douglas, MD;  [facility] OR;  Service: Vascular;  Laterality: N/A;   CAPD REMOVAL N/A 02/13/2022   Procedure: PERITONEAL DIALYSIS CATHETER REMOVAL;  Surgeon: Leonie Douglas, MD;  Location: [facility] OR;  Service: Vascular;  Laterality: N/A;   CHOLECYSTECTOMY     COLONOSCOPY W/ BIOPSIES AND POLYPECTOMY     DRUG INDUCED ENDOSCOPY Bilateral 07/06/2023   Procedure: DRUG INDUCED SLEEP ENDOSCOPY;  Surgeon: Christia Reading, MD;  Location: [facility] OR;  Service: ENT;  Laterality: Bilateral;   GIVENS CAPSULE STUDY N/A 03/01/2018   Procedure: GIVENS CAPSULE STUDY;  Surgeon: Beverley Fiedler, MD;  Location: [facility] ENDOSCOPY;  Service: Gastroenterology;  Laterality: N/A;   HERNIA REPAIR     As baby   IMPLANTATION OF HYPOGLOSSAL NERVE STIMULATOR Right 01/14/2024   Procedure: RIGHT IMPLANTATION OF HYPOGLOSSAL NERVE STIMULATOR;  Surgeon: Christia Reading, MD;  Location: [facility] OR;  Service: ENT;  Laterality: Right;   LEFT HEART CATH AND CORONARY ANGIOGRAPHY N/A 09/11/2022   Procedure: LEFT HEART CATH AND CORONARY ANGIOGRAPHY;  Surgeon: Corky Crafts, MD;  Location: [facility] INVASIVE CV LAB;  Service: Cardiovascular;  Laterality: N/A;   MITRAL VALVE REPAIR N/A 08/21/2022   Procedure: MINIMALLY INVASIVE MITRAL VALVE REPAIR USING RING ANNULOPLASTY SIMULUS 30mm;  Surgeon: Eugenio Hoes, MD;  Location[facility];  Service: Open Heart Surgery;  Laterality: N/A;   MULTIPLE TOOTH EXTRACTIONS     NEPHRECTOMY Left 02/18/2021   NOSE  SURGERY     RENAL BIOPSY     RIGHT/LEFT HEART CATH AND CORONARY ANGIOGRAPHY N/A 09/04/2021   Procedure: RIGHT/LEFT HEART CATH AND CORONARY ANGIOGRAPHY;  Surgeon: Kathleene Hazel, MD;  [facility] INVASIVE CV LAB;  Service: Cardiovascular;  Laterality: N/A;   RIGHT/LEFT HEART CATH AND CORONARY ANGIOGRAPHY N/A 08/01/2022   Procedure: RIGHT/LEFT HEART CATH AND CORONARY ANGIOGRAPHY;  Surgeon: Tonny Bollman, MD;  Location: [facility] INVASIVE CV LAB;  Service: Cardiovascular;  Laterality: N/A;   TEE WITHOUT CARDIOVERSION N/A 09/03/2020   Procedure: TRANSESOPHAGEAL ECHOCARDIOGRAM (TEE);  Surgeon: Pricilla Riffle, MD;  Location: [name] ENDOSCOPY;  Service: Cardiovascular;  Laterality: N/A;  TEE WITHOUT CARDIOVERSION N/A 06/03/2022   Procedure: TRANSESOPHAGEAL ECHOCARDIOGRAM (TEE);  Surgeon: Parke Poisson, MD;  Location: [facility] ENDOSCOPY;  Service: Cardiology;  Laterality: N/A;   TEE WITHOUT CARDIOVERSION N/A 08/21/2022   Procedure: TRANSESOPHAGEAL ECHOCARDIOGRAM (TEE);  Surgeon: Eugenio Hoes, MD;  Location: [facility] OR;  Service: Open Heart Surgery;  Laterality: N/A;   UPPER GI ENDOSCOPY  04/05/2021    MEDICATIONS: No current facility-administered medications for this encounter.    allopurinol (ZYLOPRIM) 100 MG tablet   amLODipine (NORVASC) 2.5 MG tablet   azelastine (ASTELIN) 0.1 % nasal spray   B Complex-C-Zn-Folic Acid (DIALYVITE 800/ZINC PO)   bictegravir-emtricitabine-tenofovir AF (BIKTARVY) 50-200-25 MG TABS tablet   cetirizine (ZYRTEC) 10 MG tablet   cinacalcet (SENSIPAR) 90 MG tablet   diclofenac Sodium (VOLTAREN) 1 % GEL   famotidine (PEPCID) 20 MG tablet   ferric citrate (AURYXIA) 1 GM 210 MG(Fe) tablet   fluticasone (FLOVENT HFA) 110 MCG/ACT inhaler   gabapentin (NEURONTIN) 300 MG capsule   ipratropium (ATROVENT) 0.06 % nasal spray   iron sucrose (VENOFER) 20 MG/ML injection   lactulose (CHRONULAC) 10 GM/15ML solution   levalbuterol (XOPENEX HFA) 45 MCG/ACT inhaler   levothyroxine  (SYNTHROID) 25 MCG tablet   lubiprostone (AMITIZA) 24 MCG capsule   Methoxy PEG-Epoetin Beta (MIRCERA IJ)   metoprolol tartrate (LOPRESSOR) 25 MG tablet   Olopatadine HCl (PATADAY) 0.7 % SOLN   pantoprazole (PROTONIX) 40 MG tablet   rOPINIRole (REQUIP) 1 MG tablet   rosuvastatin (CRESTOR) 10 MG tablet   SYMBICORT 160-4.5 MCG/ACT inhaler   tiZANidine (ZANAFLEX) 2 MG tablet   Vitamin D, Ergocalciferol, (DRISDOL) 1.25 MG (50000 UNIT) CAPS capsule   warfarin (COUMADIN) 5 MG tablet   HYDROcodone-acetaminophen (NORCO/VICODIN) 5-325 MG tablet   sevelamer carbonate (RENVELA) 800 MG tablet   Spacer/Aero-Holding Chambers DEVI    Shonna Chock, PA-C Surgical Short Stay/Anesthesiology [facility] Phone [phone] [facility] Phone [phone] 01/18/2024 11:40 PM

## 2024-01-18 NOTE — Telephone Encounter (Signed)
 Called and spoke to pt and rescheduled his next INR check for 01/27/2024 which would be 1 week post his procedure.

## 2024-01-18 NOTE — Anesthesia Preprocedure Evaluation (Signed)
 Anesthesia Evaluation  Patient identified by MRN, date of birth, ID band Patient awake    Reviewed: Allergy & Precautions, NPO status , Patient's Chart, lab work & pertinent test results, reviewed documented beta blocker date and time   History of Anesthesia Complications Negative for: history of anesthetic complications  Airway Mallampati: II  TM Distance: >3 FB Neck ROM: Full    Dental no notable dental hx. (+) Missing   Pulmonary asthma , sleep apnea , neg COPD, former smoker   breath sounds clear to auscultation       Cardiovascular hypertension, + CAD, + Past MI and +CHF  + dysrhythmias + Valvular Problems/Murmurs  Rhythm:Regular Rate:Normal  Last TTE with low normal LVEF, normal prosthetic MV function, mod TR, ?RV function/mod RV dilation   Neuro/Psych  Headaches CVA, No Residual Symptoms    GI/Hepatic ,GERD  ,,(+) Hepatitis -, B  Endo/Other  Hypothyroidism    Renal/GU ESRF and DialysisRenal disease     Musculoskeletal  (+) Arthritis ,    Abdominal   Peds  Hematology  (+) Blood dyscrasia, anemia   Anesthesia Other Findings   Reproductive/Obstetrics                             Anesthesia Physical Anesthesia Plan  ASA: 3  Anesthesia Plan: General   Post-op Pain Management:    Induction: Intravenous  PONV Risk Score and Plan: 2 and Ondansetron and Dexamethasone  Airway Management Planned: LMA  Additional Equipment:   Intra-op Plan:   Post-operative Plan: Extubation in OR  Informed Consent: I have reviewed the patients History and Physical, chart, labs and discussed the procedure including the risks, benefits and alternatives for the proposed anesthesia with the patient or authorized representative who has indicated his/her understanding and acceptance.     Dental advisory given  Plan Discussed with: CRNA  Anesthesia Plan Comments: (PAT note written 01/18/2024 by  Shonna Chock, PA-C.  )       Anesthesia Quick Evaluation

## 2024-01-19 DIAGNOSIS — N2581 Secondary hyperparathyroidism of renal origin: Secondary | ICD-10-CM | POA: Diagnosis not present

## 2024-01-19 DIAGNOSIS — E876 Hypokalemia: Secondary | ICD-10-CM | POA: Diagnosis not present

## 2024-01-19 DIAGNOSIS — Z992 Dependence on renal dialysis: Secondary | ICD-10-CM | POA: Diagnosis not present

## 2024-01-19 DIAGNOSIS — D509 Iron deficiency anemia, unspecified: Secondary | ICD-10-CM | POA: Diagnosis not present

## 2024-01-19 DIAGNOSIS — T8249XD Other complication of vascular dialysis catheter, subsequent encounter: Secondary | ICD-10-CM | POA: Diagnosis not present

## 2024-01-19 DIAGNOSIS — D689 Coagulation defect, unspecified: Secondary | ICD-10-CM | POA: Diagnosis not present

## 2024-01-19 DIAGNOSIS — D631 Anemia in chronic kidney disease: Secondary | ICD-10-CM | POA: Diagnosis not present

## 2024-01-19 DIAGNOSIS — N186 End stage renal disease: Secondary | ICD-10-CM | POA: Diagnosis not present

## 2024-01-20 ENCOUNTER — Ambulatory Visit (HOSPITAL_COMMUNITY): Payer: Self-pay | Admitting: Vascular Surgery

## 2024-01-20 ENCOUNTER — Other Ambulatory Visit: Payer: Self-pay

## 2024-01-20 ENCOUNTER — Encounter (HOSPITAL_COMMUNITY): Payer: Self-pay | Admitting: Vascular Surgery

## 2024-01-20 ENCOUNTER — Ambulatory Visit (HOSPITAL_COMMUNITY)
Admission: RE | Admit: 2024-01-20 | Discharge: 2024-01-20 | Disposition: A | Discharge status: Home / Self Care | Attending: Vascular Surgery | Admitting: Vascular Surgery

## 2024-01-20 ENCOUNTER — Encounter (HOSPITAL_COMMUNITY)
Admission: RE | Disposition: A | Discharge status: Home / Self Care | Payer: Self-pay | Source: Home / Self Care | Attending: Vascular Surgery

## 2024-01-20 ENCOUNTER — Ambulatory Visit (HOSPITAL_COMMUNITY)

## 2024-01-20 ENCOUNTER — Ambulatory Visit (HOSPITAL_BASED_OUTPATIENT_CLINIC_OR_DEPARTMENT_OTHER): Admitting: Vascular Surgery

## 2024-01-20 DIAGNOSIS — I2510 Atherosclerotic heart disease of native coronary artery without angina pectoris: Secondary | ICD-10-CM | POA: Insufficient documentation

## 2024-01-20 DIAGNOSIS — Z992 Dependence on renal dialysis: Secondary | ICD-10-CM | POA: Diagnosis not present

## 2024-01-20 DIAGNOSIS — N185 Chronic kidney disease, stage 5: Secondary | ICD-10-CM | POA: Diagnosis not present

## 2024-01-20 DIAGNOSIS — N186 End stage renal disease: Secondary | ICD-10-CM | POA: Diagnosis not present

## 2024-01-20 DIAGNOSIS — K219 Gastro-esophageal reflux disease without esophagitis: Secondary | ICD-10-CM | POA: Diagnosis not present

## 2024-01-20 DIAGNOSIS — Z87891 Personal history of nicotine dependence: Secondary | ICD-10-CM | POA: Insufficient documentation

## 2024-01-20 DIAGNOSIS — I517 Cardiomegaly: Secondary | ICD-10-CM | POA: Diagnosis not present

## 2024-01-20 DIAGNOSIS — Z21 Asymptomatic human immunodeficiency virus [HIV] infection status: Secondary | ICD-10-CM | POA: Diagnosis not present

## 2024-01-20 DIAGNOSIS — J45909 Unspecified asthma, uncomplicated: Secondary | ICD-10-CM | POA: Insufficient documentation

## 2024-01-20 DIAGNOSIS — L98499 Non-pressure chronic ulcer of skin of other sites with unspecified severity: Secondary | ICD-10-CM | POA: Diagnosis not present

## 2024-01-20 DIAGNOSIS — G4730 Sleep apnea, unspecified: Secondary | ICD-10-CM | POA: Diagnosis not present

## 2024-01-20 DIAGNOSIS — Z7901 Long term (current) use of anticoagulants: Secondary | ICD-10-CM | POA: Diagnosis not present

## 2024-01-20 DIAGNOSIS — I480 Paroxysmal atrial fibrillation: Secondary | ICD-10-CM | POA: Insufficient documentation

## 2024-01-20 DIAGNOSIS — I252 Old myocardial infarction: Secondary | ICD-10-CM | POA: Insufficient documentation

## 2024-01-20 DIAGNOSIS — I770 Arteriovenous fistula, acquired: Secondary | ICD-10-CM

## 2024-01-20 DIAGNOSIS — Z452 Encounter for adjustment and management of vascular access device: Secondary | ICD-10-CM | POA: Diagnosis not present

## 2024-01-20 DIAGNOSIS — I132 Hypertensive heart and chronic kidney disease with heart failure and with stage 5 chronic kidney disease, or end stage renal disease: Secondary | ICD-10-CM | POA: Diagnosis not present

## 2024-01-20 DIAGNOSIS — J9811 Atelectasis: Secondary | ICD-10-CM | POA: Diagnosis not present

## 2024-01-20 DIAGNOSIS — I081 Rheumatic disorders of both mitral and tricuspid valves: Secondary | ICD-10-CM | POA: Insufficient documentation

## 2024-01-20 DIAGNOSIS — T82590A Other mechanical complication of surgically created arteriovenous fistula, initial encounter: Secondary | ICD-10-CM | POA: Diagnosis not present

## 2024-01-20 DIAGNOSIS — I5032 Chronic diastolic (congestive) heart failure: Secondary | ICD-10-CM | POA: Diagnosis not present

## 2024-01-20 DIAGNOSIS — Z7951 Long term (current) use of inhaled steroids: Secondary | ICD-10-CM | POA: Diagnosis not present

## 2024-01-20 DIAGNOSIS — I4892 Unspecified atrial flutter: Secondary | ICD-10-CM | POA: Diagnosis not present

## 2024-01-20 DIAGNOSIS — T82898A Other specified complication of vascular prosthetic devices, implants and grafts, initial encounter: Secondary | ICD-10-CM | POA: Diagnosis not present

## 2024-01-20 DIAGNOSIS — I5033 Acute on chronic diastolic (congestive) heart failure: Secondary | ICD-10-CM | POA: Diagnosis not present

## 2024-01-20 DIAGNOSIS — I700 Atherosclerosis of aorta: Secondary | ICD-10-CM | POA: Diagnosis not present

## 2024-01-20 HISTORY — PX: REVISON OF ARTERIOVENOUS FISTULA: SHX6074

## 2024-01-20 HISTORY — PX: INSERTION OF DIALYSIS CATHETER: SHX1324

## 2024-01-20 LAB — POCT I-STAT, CHEM 8
BUN: 28 mg/dL — ABNORMAL HIGH (ref 6–20)
Calcium, Ion: 1.09 mmol/L — ABNORMAL LOW (ref 1.15–1.40)
Chloride: 99 mmol/L (ref 98–111)
Creatinine, Ser: 7.5 mg/dL — ABNORMAL HIGH (ref 0.61–1.24)
Glucose, Bld: 99 mg/dL (ref 70–99)
HCT: 35 % — ABNORMAL LOW (ref 39.0–52.0)
Hemoglobin: 11.9 g/dL — ABNORMAL LOW (ref 13.0–17.0)
Potassium: 3.5 mmol/L (ref 3.5–5.1)
Sodium: 138 mmol/L (ref 135–145)
TCO2: 28 mmol/L (ref 22–32)

## 2024-01-20 LAB — PROTIME-INR
INR: 1.1 (ref 0.8–1.2)
Prothrombin Time: 14.7 s (ref 11.4–15.2)

## 2024-01-20 SURGERY — REVISON OF ARTERIOVENOUS FISTULA
Anesthesia: General | Site: Neck | Laterality: Right

## 2024-01-20 MED ORDER — METOPROLOL TARTRATE 12.5 MG HALF TABLET
ORAL_TABLET | ORAL | Status: DC
Start: 2024-01-20 — End: 2024-01-20
  Filled 2024-01-20: qty 2

## 2024-01-20 MED ORDER — CHLORHEXIDINE GLUCONATE 4 % EX SOLN: 60.0000 mL | Freq: Once | CUTANEOUS | Status: DC

## 2024-01-20 MED ORDER — CHLORHEXIDINE GLUCONATE 0.12 % MT SOLN: 15.0000 mL | Freq: Once | OROMUCOSAL | Status: AC

## 2024-01-20 MED ORDER — MIDAZOLAM HCL 2 MG/2ML IJ SOLN
INTRAMUSCULAR | Status: AC
  Filled 2024-01-20: qty 2

## 2024-01-20 MED ORDER — LIDOCAINE-EPINEPHRINE (PF) 1 %-1:200000 IJ SOLN
INTRAMUSCULAR | Status: AC
  Filled 2024-01-20: qty 30

## 2024-01-20 MED ORDER — LIDOCAINE 2% (20 MG/ML) 5 ML SYRINGE
INTRAMUSCULAR | Status: DC | PRN
  Administered 2024-01-20: 40 mg via INTRAVENOUS
  Administered 2024-01-20: 60 mg via INTRAVENOUS

## 2024-01-20 MED ORDER — PHENYLEPHRINE HCL-NACL 20-0.9 MG/250ML-% IV SOLN
INTRAVENOUS | Status: DC | PRN
  Administered 2024-01-20: 40 ug/min via INTRAVENOUS

## 2024-01-20 MED ORDER — PHENYLEPHRINE HCL (PRESSORS) 10 MG/ML IV SOLN
INTRAVENOUS | Status: AC
  Filled 2024-01-20: qty 1

## 2024-01-20 MED ORDER — LIDOCAINE 2% (20 MG/ML) 5 ML SYRINGE
INTRAMUSCULAR | Status: AC
  Filled 2024-01-20: qty 5

## 2024-01-20 MED ORDER — OXYCODONE HCL 5 MG/5ML PO SOLN: 5.0000 mg | Freq: Once | ORAL | Status: AC | PRN

## 2024-01-20 MED ORDER — PHENYLEPHRINE 80 MCG/ML (10ML) SYRINGE FOR IV PUSH (FOR BLOOD PRESSURE SUPPORT)
PREFILLED_SYRINGE | INTRAVENOUS | Status: AC
  Filled 2024-01-20: qty 10

## 2024-01-20 MED ORDER — ONDANSETRON HCL 4 MG/2ML IJ SOLN
INTRAMUSCULAR | Status: DC | PRN
Start: 2024-01-20 — End: 2024-01-20
  Administered 2024-01-20: 4 mg via INTRAVENOUS

## 2024-01-20 MED ORDER — ACETAMINOPHEN 10 MG/ML IV SOLN: 1000.0000 mg | Freq: Once | INTRAVENOUS | Status: DC | PRN

## 2024-01-20 MED ORDER — OXYCODONE HCL 5 MG PO TABS
ORAL_TABLET | ORAL | Status: AC
  Filled 2024-01-20: qty 1

## 2024-01-20 MED ORDER — CEFAZOLIN SODIUM-DEXTROSE 2-4 GM/100ML-% IV SOLN
2.0000 g | INTRAVENOUS | Status: AC
  Administered 2024-01-20: 2 g via INTRAVENOUS
  Filled 2024-01-20: qty 100

## 2024-01-20 MED ORDER — PHENYLEPHRINE 80 MCG/ML (10ML) SYRINGE FOR IV PUSH (FOR BLOOD PRESSURE SUPPORT)
PREFILLED_SYRINGE | INTRAVENOUS | Status: DC | PRN
  Administered 2024-01-20: 120 ug via INTRAVENOUS
  Administered 2024-01-20 (×2): 80 ug via INTRAVENOUS

## 2024-01-20 MED ORDER — ONDANSETRON HCL 4 MG/2ML IJ SOLN
INTRAMUSCULAR | Status: AC
Start: 2024-01-20 — End: ?
  Filled 2024-01-20: qty 2

## 2024-01-20 MED ORDER — PROPOFOL 10 MG/ML IV BOLUS
INTRAVENOUS | Status: DC | PRN
  Administered 2024-01-20: 150 mg via INTRAVENOUS
  Administered 2024-01-20: 20 mg via INTRAVENOUS

## 2024-01-20 MED ORDER — METOPROLOL TARTRATE 12.5 MG HALF TABLET
25.0000 mg | ORAL_TABLET | Freq: Once | ORAL | Status: AC
  Administered 2024-01-20: 25 mg via ORAL

## 2024-01-20 MED ORDER — SODIUM CHLORIDE 0.9 % IV SOLN: INTRAVENOUS | Status: DC

## 2024-01-20 MED ORDER — DEXAMETHASONE SODIUM PHOSPHATE 10 MG/ML IJ SOLN
INTRAMUSCULAR | Status: DC | PRN
  Administered 2024-01-20: 8 mg via INTRAVENOUS

## 2024-01-20 MED ORDER — FENTANYL CITRATE (PF) 100 MCG/2ML IJ SOLN
INTRAMUSCULAR | Status: DC | PRN
  Administered 2024-01-20 (×2): 25 ug via INTRAVENOUS
  Administered 2024-01-20: 50 ug via INTRAVENOUS

## 2024-01-20 MED ORDER — HYDROCODONE-ACETAMINOPHEN 5-325 MG PO TABS: 1.0000 | ORAL_TABLET | Freq: Four times a day (QID) | ORAL | 0 refills | Status: DC | PRN

## 2024-01-20 MED ORDER — LIDOCAINE HCL (PF) 1 % IJ SOLN
INTRAMUSCULAR | Status: AC
  Filled 2024-01-20: qty 30

## 2024-01-20 MED ORDER — HEPARIN SODIUM (PORCINE) 1000 UNIT/ML IJ SOLN
INTRAMUSCULAR | Status: AC
Start: 2024-01-20 — End: ?
  Filled 2024-01-20: qty 10

## 2024-01-20 MED ORDER — FENTANYL CITRATE (PF) 100 MCG/2ML IJ SOLN
INTRAMUSCULAR | Status: AC
  Filled 2024-01-20: qty 2

## 2024-01-20 MED ORDER — EPHEDRINE 5 MG/ML INJ
INTRAVENOUS | Status: AC
  Filled 2024-01-20: qty 5

## 2024-01-20 MED ORDER — HEPARIN SODIUM (PORCINE) 1000 UNIT/ML IJ SOLN
INTRAMUSCULAR | Status: DC | PRN
  Administered 2024-01-20: 1000 [IU]

## 2024-01-20 MED ORDER — SODIUM CHLORIDE 0.9% FLUSH: 3.0000 mL | Freq: Two times a day (BID) | INTRAVENOUS | Status: DC

## 2024-01-20 MED ORDER — PROPOFOL 10 MG/ML IV BOLUS
INTRAVENOUS | Status: AC
  Filled 2024-01-20: qty 20

## 2024-01-20 MED ORDER — LIDOCAINE-EPINEPHRINE (PF) 1 %-1:200000 IJ SOLN
INTRAMUSCULAR | Status: DC | PRN
  Administered 2024-01-20: 6 mL

## 2024-01-20 MED ORDER — DEXAMETHASONE SODIUM PHOSPHATE 10 MG/ML IJ SOLN
INTRAMUSCULAR | Status: AC
  Filled 2024-01-20: qty 1

## 2024-01-20 MED ORDER — CHLORHEXIDINE GLUCONATE 0.12 % MT SOLN
OROMUCOSAL | Status: AC
Start: 2024-01-20 — End: 2024-01-20
  Administered 2024-01-20: 15 mL via OROMUCOSAL
  Filled 2024-01-20: qty 15

## 2024-01-20 MED ORDER — ORAL CARE MOUTH RINSE: 15.0000 mL | Freq: Once | OROMUCOSAL | Status: AC

## 2024-01-20 MED ORDER — HEPARIN SODIUM (PORCINE) 1000 UNIT/ML IJ SOLN
INTRAMUSCULAR | Status: AC
  Filled 2024-01-20: qty 10

## 2024-01-20 MED ORDER — 0.9 % SODIUM CHLORIDE (POUR BTL) OPTIME
TOPICAL | Status: DC | PRN
  Administered 2024-01-20: 1000 mL

## 2024-01-20 MED ORDER — MIDAZOLAM HCL 2 MG/2ML IJ SOLN
INTRAMUSCULAR | Status: DC | PRN
  Administered 2024-01-20: 2 mg via INTRAVENOUS

## 2024-01-20 MED ORDER — OXYCODONE HCL 5 MG PO TABS
5.0000 mg | ORAL_TABLET | Freq: Once | ORAL | Status: AC | PRN
  Administered 2024-01-20: 5 mg via ORAL

## 2024-01-20 MED ORDER — FENTANYL CITRATE (PF) 100 MCG/2ML IJ SOLN: 25.0000 ug | INTRAMUSCULAR | Status: DC | PRN

## 2024-01-20 MED ORDER — HEPARIN SODIUM (PORCINE) 1000 UNIT/ML IJ SOLN
INTRAMUSCULAR | Status: DC | PRN
  Administered 2024-01-20: 8000 [IU] via INTRAVENOUS

## 2024-01-20 MED ORDER — EPHEDRINE SULFATE-NACL 50-0.9 MG/10ML-% IV SOSY
PREFILLED_SYRINGE | INTRAVENOUS | Status: DC | PRN
  Administered 2024-01-20 (×4): 5 mg via INTRAVENOUS

## 2024-01-20 MED ORDER — HEPARIN 6000 UNIT IRRIGATION SOLUTION
Status: AC
  Filled 2024-01-20: qty 500

## 2024-01-20 MED ORDER — HEPARIN 6000 UNIT IRRIGATION SOLUTION
Status: DC | PRN
  Administered 2024-01-20: 1

## 2024-01-20 SURGICAL SUPPLY — 58 items
ARMBAND PINK RESTRICT EXTREMIT (MISCELLANEOUS) ×3 IMPLANT
BAG COUNTER SPONGE SURGICOUNT (BAG) ×3 IMPLANT
BAG DECANTER FOR FLEXI CONT (MISCELLANEOUS) ×3 IMPLANT
BIOPATCH RED 1 DISK 7.0 (GAUZE/BANDAGES/DRESSINGS) ×3 IMPLANT
CANISTER SUCT 3000ML PPV (MISCELLANEOUS) ×3 IMPLANT
CATH PALINDROME-P 19CM W/VT (CATHETERS) IMPLANT
CATH PALINDROME-P 23CM W/VT (CATHETERS) IMPLANT
CATH PALINDROME-P 28CM W/VT (CATHETERS) IMPLANT
CATH STRAIGHT 5FR 65CM (CATHETERS) IMPLANT
CLIP TI MEDIUM 6 (CLIP) ×3 IMPLANT
CLIP TI WIDE RED SMALL 6 (CLIP) ×3 IMPLANT
COVER PROBE W GEL 5X96 (DRAPES) ×3 IMPLANT
COVER SURGICAL LIGHT HANDLE (MISCELLANEOUS) ×3 IMPLANT
DERMABOND ADVANCED .7 DNX12 (GAUZE/BANDAGES/DRESSINGS) ×3 IMPLANT
DRAPE C-ARM 42X72 X-RAY (DRAPES) ×3 IMPLANT
DRAPE CHEST BREAST 15X10 FENES (DRAPES) ×3 IMPLANT
ELECT REM PT RETURN 9FT ADLT (ELECTROSURGICAL) ×3 IMPLANT
ELECTRODE REM PT RTRN 9FT ADLT (ELECTROSURGICAL) ×3 IMPLANT
GAUZE 4X4 16PLY ~~LOC~~+RFID DBL (SPONGE) ×3 IMPLANT
GLOVE BIO SURGEON STRL SZ7.5 (GLOVE) ×3 IMPLANT
GLOVE BIOGEL PI IND STRL 8 (GLOVE) ×3 IMPLANT
GOWN STRL REUS W/ TWL LRG LVL3 (GOWN DISPOSABLE) ×6 IMPLANT
GOWN STRL REUS W/ TWL XL LVL3 (GOWN DISPOSABLE) ×6 IMPLANT
HEMOSTAT SPONGE AVITENE ULTRA (HEMOSTASIS) IMPLANT
KIT BASIN OR (CUSTOM PROCEDURE TRAY) ×3 IMPLANT
KIT PALINDROME-P 55CM (CATHETERS) IMPLANT
KIT TURNOVER KIT B (KITS) ×3 IMPLANT
NDL 18GX1X1/2 (RX/OR ONLY) (NEEDLE) ×3 IMPLANT
NDL HYPO 25GX1X1/2 BEV (NEEDLE) ×3 IMPLANT
NEEDLE 18GX1X1/2 (RX/OR ONLY) (NEEDLE) ×3 IMPLANT
NEEDLE HYPO 25GX1X1/2 BEV (NEEDLE) ×3 IMPLANT
NS IRRIG 1000ML POUR BTL (IV SOLUTION) ×3 IMPLANT
PACK CV ACCESS (CUSTOM PROCEDURE TRAY) ×3 IMPLANT
PACK SRG BSC III STRL LF ECLPS (CUSTOM PROCEDURE TRAY) ×3 IMPLANT
PAD ARMBOARD POSITIONER FOAM (MISCELLANEOUS) ×6 IMPLANT
SET MICROPUNCTURE 5F STIFF (MISCELLANEOUS) IMPLANT
SLING ARM FOAM STRAP LRG (SOFTGOODS) IMPLANT
SLING ARM FOAM STRAP MED (SOFTGOODS) IMPLANT
SOAP 2 % CHG 4 OZ (WOUND CARE) ×3 IMPLANT
SPIKE FLUID TRANSFER (MISCELLANEOUS) ×3 IMPLANT
STAPLER VISISTAT 35W (STAPLE) IMPLANT
SUT ETHILON 3 0 PS 1 (SUTURE) ×3 IMPLANT
SUT MNCRL AB 4-0 PS2 18 (SUTURE) ×3 IMPLANT
SUT PROLENE 5 0 C 1 24 (SUTURE) IMPLANT
SUT PROLENE 5 0 C 1 36 (SUTURE) IMPLANT
SUT PROLENE 6 0 BV (SUTURE) IMPLANT
SUT PROLENE 7 0 BV 1 (SUTURE) IMPLANT
SUT VIC AB 3-0 SH 27X BRD (SUTURE) ×3 IMPLANT
SYR 10ML LL (SYRINGE) ×3 IMPLANT
SYR 20ML LL LF (SYRINGE) ×6 IMPLANT
SYR 5ML LL (SYRINGE) ×3 IMPLANT
SYR CONTROL 10ML LL (SYRINGE) ×3 IMPLANT
TOWEL GREEN STERILE (TOWEL DISPOSABLE) ×3 IMPLANT
TOWEL GREEN STERILE FF (TOWEL DISPOSABLE) ×3 IMPLANT
UNDERPAD 30X36 HEAVY ABSORB (UNDERPADS AND DIAPERS) ×3 IMPLANT
WATER STERILE IRR 1000ML POUR (IV SOLUTION) ×3 IMPLANT
WIRE AMPLATZ SS-J .035X180CM (WIRE) IMPLANT
WIRE TORQFLEX AUST .018X40CM (WIRE) IMPLANT

## 2024-01-20 NOTE — H&P (Signed)
 History and Physical Interval Note:  01/20/2024 7:26 AM  Albert Eaton  has presented today for surgery, with the diagnosis of ESRD.  The various methods of treatment have been discussed with the patient and family. After consideration of risks, benefits and other options for treatment, the patient has consented to  Procedure(s): REVISON OF ARTERIOVENOUS FISTULA (Left) INSERTION OF DIALYSIS CATHETER (N/A) as a surgical intervention.  The patient's history has been reviewed, patient examined, no change in status, stable for surgery.  I have reviewed the patient's chart and labs.  Questions were answered to the patient's satisfaction.     Cephus Shelling     Patient name: Albert Eaton        MRN: [id number]        DOB: Aug 21, 1966            Sex: male   REASON FOR CONSULT: Thinning area with scab over left arm access   HPI: Albert Eaton is a 58 y.o. male, with history of CHF, ESRD, HIV, hypertension, hyperlipidemia that presents for evaluation of a thinning area with scab over left arm access.  Patient has a left basilic vein fistula placed in 2019.  He has also had a previous PD catheter removed.   States has a scab over his left arm AV fistula and this area has been getting thinner in regards to the skin.  Fistula is working well.  No bleeding events.       Past Medical History:  Diagnosis Date   Anemia     Aortic atherosclerosis (HCC)     Arthritis     Ascending aorta dilation (HCC) 02/17/2023    Limited TTE 02/17/23: no effusion, s/p MV repair w trivial residual MR, asc aorta 39 mm, mod TR, severe LAE   Asthma     Cancer (HCC)      renal cyst   Chronic diastolic CHF (congestive heart failure) (HCC) 01/06/2017    Echo 7/16 [facility] in [place], [place]) Mild AI, mild LAE, mild concentric LVH, EF 55, normal wall motion, mild to moderate MR, mild PI, RVSP 55 // Echo 10/09/16 ([facility]):  Moderate LVH, grade 2 diastolic dysfunction, mild MR, moderate LAE    Dysrhythmia       paroxysmal afib/flutter   Esophagitis     ESRD (end stage renal disease) on dialysis [facility])      Dialysis Mon Wed Fri   GERD (gastroesophageal reflux disease)     Gout      no current problems   Hepatitis      Hep B   HIV (human immunodeficiency virus infection) (HCC)     HLD (hyperlipidemia)     Hypertension     Hypothyroidism     Mitral regurgitation     Neuropathy     Sleep apnea      uses c-pap   Wears glasses     Wears partial dentures                 Past Surgical History:  Procedure Laterality Date   AV FISTULA PLACEMENT Left 07/02/2018    Procedure: Creation of Left arm BRACHIOBASILIC ARTERIOVENOUS  FISTULA;  Surgeon: Cephus Shelling, MD;  Location: [facility] OR;  Service: Vascular;  Laterality: Left;   BASCILIC VEIN TRANSPOSITION Left 08/27/2018    Procedure: Left arm BRACHIOBASILIC VEIN TRANSPOSITION SECOND STAGE;  Surgeon: Cephus Shelling, MD;  Location: [facility] OR;  Service: Vascular;  Laterality: Left;   BUBBLE STUDY  09/03/2020    Procedure: BUBBLE STUDY;  Surgeon: Pricilla Riffle, MD;  Location: [facility] ENDOSCOPY;  Service: Cardiovascular;;   BUBBLE STUDY   06/03/2022    Procedure: BUBBLE STUDY;  Surgeon: Parke Poisson, MD;  Location: [facility] ENDOSCOPY;  Service: Cardiology;;   CAPD INSERTION N/A 05/30/2021    Procedure: LAPAROSCOPIC INSERTION CONTINUOUS AMBULATORY PERITONEAL DIALYSIS  (CAPD) CATHETER;  Surgeon: Leonie Douglas, MD;  Location: [facility] OR;  Service: Vascular;  Laterality: N/A;   CAPD REMOVAL N/A 02/13/2022    Procedure: PERITONEAL DIALYSIS CATHETER REMOVAL;  Surgeon: Leonie Douglas, MD;  Location: [facility] OR;  Service: Vascular;  Laterality: N/A;   CHOLECYSTECTOMY       COLONOSCOPY W/ BIOPSIES AND POLYPECTOMY       DRUG INDUCED ENDOSCOPY Bilateral 07/06/2023    Procedure: DRUG INDUCED SLEEP ENDOSCOPY;  Surgeon: Christia Reading, MD;  Location: [facility] OR;  Service: ENT;  Laterality: Bilateral;   GIVENS CAPSULE STUDY N/A 03/01/2018    Procedure: GIVENS CAPSULE STUDY;   Surgeon: Beverley Fiedler, MD;  Location: [facility] ENDOSCOPY;  Service: Gastroenterology;  Laterality: N/A;   HERNIA REPAIR        As baby   LEFT HEART CATH AND CORONARY ANGIOGRAPHY N/A 09/11/2022    Procedure: LEFT HEART CATH AND CORONARY ANGIOGRAPHY;  Surgeon: Corky Crafts, MD;  Location: [facility] INVASIVE CV LAB;  Service: Cardiovascular;  Laterality: N/A;   MITRAL VALVE REPAIR N/A 08/21/2022    Procedure: MINIMALLY INVASIVE MITRAL VALVE REPAIR USING RING ANNULOPLASTY SIMULUS 30mm;  Surgeon: Eugenio Hoes, MD;  Location: [facility] OR;  Service: Open Heart Surgery;  Laterality: N/A;   MULTIPLE TOOTH EXTRACTIONS       NEPHRECTOMY Left 02/18/2021   NOSE SURGERY       RENAL BIOPSY       RIGHT/LEFT HEART CATH AND CORONARY ANGIOGRAPHY N/A 09/04/2021    Procedure: RIGHT/LEFT HEART CATH AND CORONARY ANGIOGRAPHY;  Surgeon: Kathleene Hazel, MD;  Location: [facility] INVASIVE CV LAB;  Service: Cardiovascular;  Laterality: N/A;   RIGHT/LEFT HEART CATH AND CORONARY ANGIOGRAPHY N/A 08/01/2022    Procedure: RIGHT/LEFT HEART CATH AND CORONARY ANGIOGRAPHY;  Surgeon: Tonny Bollman, MD;  Location: [facility] INVASIVE CV LAB;  Service: Cardiovascular;  Laterality: N/A;   TEE WITHOUT CARDIOVERSION N/A 09/03/2020    Procedure: TRANSESOPHAGEAL ECHOCARDIOGRAM (TEE);  Surgeon: Pricilla Riffle, MD;  Location: [facility] ENDOSCOPY;  Service: Cardiovascular;  Laterality: N/A;   TEE WITHOUT CARDIOVERSION N/A 06/03/2022    Procedure: TRANSESOPHAGEAL ECHOCARDIOGRAM (TEE);  Surgeon: Parke Poisson, MD;  Location: [facility] ENDOSCOPY;  Service: Cardiology;  Laterality: N/A;   TEE WITHOUT CARDIOVERSION N/A 08/21/2022    Procedure: TRANSESOPHAGEAL ECHOCARDIOGRAM (TEE);  Surgeon: Eugenio Hoes, MD;  Location: [facility] OR;  Service: Open Heart Surgery;  Laterality: N/A;   UPPER GI ENDOSCOPY   04/05/2021               Family History  Problem Relation Age of Onset   Hypertension Mother     Heart failure Mother     Hypertension Father     Alcohol abuse Father      Hypertension Sister     Multiple sclerosis Sister     Hypertension Sister     Hypertension Brother     Heart attack Maternal Grandmother 74   Scoliosis Other     Allergic rhinitis Neg Hx     Angioedema Neg Hx     Asthma Neg Hx     Eczema Neg Hx  Immunodeficiency Neg Hx     Urticaria Neg Hx     Colon cancer Neg Hx     Pancreatic cancer Neg Hx     Esophageal cancer Neg Hx            SOCIAL HISTORY: Social History         Socioeconomic History   Marital status: Single      Spouse name: Not on file   Number of children: 0   Years of education: Not on file   Highest education level: Not on file  Occupational History   Not on file  Tobacco Use   Smoking status: Former      Current packs/day: 0.00      Average packs/day: 1 pack/day for 18.0 years (18.0 ttl pk-yrs)      Types: Cigarettes      Start date: 68      Quit date: 2000      Years since quitting: 25.2   Smokeless tobacco: Never  Vaping Use   Vaping status: Never Used  Substance and Sexual Activity   Alcohol use: Not Currently   Drug use: No   Sexual activity: Not Currently      Partners: Male  Other Topics Concern   Not on file  Social History Narrative    Disability         H[place]bie: none    Social Drivers of Health        Financial Resource Strain: High Risk (07/13/2023)    Overall Financial Resource Strain (CARDIA)     Difficulty of Paying Living Expenses: Hard  Food Insecurity: Food Insecurity Present (07/13/2023)    Hunger Vital Sign     Worried About Running Out of Food in the Last Year: Often true     Ran Out of Food in the Last Year: Never true  Transportation Needs: No Transportation Needs (07/13/2023)    PRAPARE - Therapist, art (Medical): No     Lack of Transportation (Non-Medical): No  Physical Activity: Inactive (07/13/2023)    Exercise Vital Sign     Days of Exercise per Week: 0 days     Minutes of Exercise per Session: 0 min  Stress: No Stress Concern  Present (07/13/2023)    [employer] of Occupational Health - Occupational Stress Questionnaire     Feeling of Stress : Not at all  Social Connections: Socially Isolated (07/13/2023)    Social Connection and Isolation Panel [NHANES]     Frequency of Communication with Friends and Family: More than three times a week     Frequency of Social Gatherings with Friends and Family: More than three times a week     Attends Religious Services: Never     Database administrator or Organizations: No     Attends Banker Meetings: Never     Marital Status: Never married  Intimate Partner Violence: Not At Risk (07/13/2023)    Humiliation, Afraid, Rape, and Kick questionnaire     Fear of Current or Ex-Partner: No     Emotionally Abused: No     Physically Abused: No     Sexually Abused: No      Allergies      Allergies  Allergen Reactions   Ace Inhibitors Cough and Other (See Comments)              Current Outpatient Medications  Medication Sig Dispense Refill   allopurinol (ZYLOPRIM) 100 MG tablet  Take 1 tablet (100 mg total) by mouth daily. 90 tablet 0   amLODipine (NORVASC) 2.5 MG tablet Take 1 tablet (2.5 mg total) by mouth daily. 90 tablet 1   azelastine (ASTELIN) 0.1 % nasal spray Place 2 sprays into both nostrils 2 (two) times daily as needed for allergies. 30 mL 0   B Complex-C-Folic Acid (DIALYVITE 800) 0.8 MG TABS DIALYVITE 800+ZINC15MG  TB100       B Complex-C-Zn-Folic Acid (DIALYVITE 800-ZINC 15 PO) Take 1 tablet by mouth daily.       bictegravir-emtricitabine-tenofovir AF (BIKTARVY) 50-200-25 MG TABS tablet Take 1 tablet by mouth daily. 30 tablet 11   cetirizine (ZYRTEC) 10 MG tablet TAKE ONE TABLET BY MOUTH DAILY AT 9 a.m. 90 tablet 1   cinacalcet (SENSIPAR) 90 MG tablet Take 90 mg by mouth daily.       diclofenac Sodium (VOLTAREN) 1 % GEL Apply 2 g topically 4 (four) times daily. Apply to chest pain when lidocaine patch not in place (Patient taking differently: Apply  2 g topically 4 (four) times daily as needed (pain).) 150 g 0   ezetimibe (ZETIA) 10 MG tablet Take 1 tablet (10 mg total) by mouth daily. 90 tablet 1   famotidine (PEPCID) 20 MG tablet Take 1 tablet (20 mg total) by mouth 2 (two) times daily. 180 tablet 3   ferric citrate (AURYXIA) 1 GM 210 MG(Fe) tablet Take 420 mg by mouth See admin instructions. Take 420 mg by mouth three times a day with meals and 210 mg with each snack       fluticasone (FLOVENT HFA) 110 MCG/ACT inhaler Inhale 2 puffs into the lungs 2 (two) times daily as needed ("for flares").       gabapentin (NEURONTIN) 300 MG capsule Take 1 capsule (300 mg total) by mouth daily as needed (pain/neuropathy).       ipratropium (ATROVENT) 0.06 % nasal spray USE TWO SPRAYS IN EACH NOSTRIL 2-3 times daily AS NEEDED 15 mL 5   iron sucrose (VENOFER) 20 MG/ML injection Iron Sucrose (Venofer)       lactulose (CHRONULAC) 10 GM/15ML solution Take 45 mLs (30 g total) by mouth daily as needed. Take 15-30 ml up to twice daily for constipation (Patient taking differently: Take 40 g by mouth daily as needed.) 1800 mL 11   levalbuterol (XOPENEX HFA) 45 MCG/ACT inhaler Inhale 2 puffs into the lungs every 4 (four) hours as needed for wheezing. 15 g 1   levothyroxine (SYNTHROID) 25 MCG tablet Take 1 tablet (25 mcg total) by mouth daily before breakfast. 90 tablet 1   lubiprostone (AMITIZA) 24 MCG capsule Take 1 capsule (24 mcg total) by mouth 2 (two) times daily with a meal. NEEDS APPT FOR FURTHER REFILLS 180 capsule 0   Methoxy PEG-Epoetin Beta (MIRCERA IJ) Mircera       metoprolol tartrate (LOPRESSOR) 25 MG tablet Take 1 tablet (25 mg total) by mouth 2 (two) times daily. 90 tablet 3   Olopatadine HCl (PATADAY) 0.7 % SOLN Place 1 drop into both eyes 2 (two) times daily as needed (allergies). 2.5 mL 5   pantoprazole (PROTONIX) 40 MG tablet Take 1 tablet (40 mg total) by mouth daily. 30 tablet 5   rOPINIRole (REQUIP) 1 MG tablet Take 1 tablet (1 mg total) by  mouth at bedtime. 90 tablet 2   rosuvastatin (CRESTOR) 10 MG tablet Take 1 tablet (10 mg total) by mouth daily. 90 tablet 1   sevelamer carbonate (RENVELA) 800 MG tablet Take  2,400 mg by mouth 3 (three) times daily as needed (Phosphorus Levels).       Spacer/Aero-Holding Chambers DEVI 1 Device by Does not apply route 2 (two) times a day. 1 each 1   SYMBICORT 160-4.5 MCG/ACT inhaler Inhale 2 puffs into the lungs in the morning and at bedtime. (Patient taking differently: Inhale 2 puffs into the lungs 2 (two) times daily as needed (Asthma).) 10.2 g 5   tiZANidine (ZANAFLEX) 2 MG tablet Take 1 tablet (2 mg total) by mouth 2 (two) times daily as needed for muscle spasms. 30 tablet 0   Vitamin D, Ergocalciferol, (DRISDOL) 1.25 MG (50000 UNIT) CAPS capsule Take 1 capsule (50,000 Units total) by mouth every Tuesday. 12 capsule 0   warfarin (COUMADIN) 5 MG tablet Take 1 tablet to 2 tablets by mouth daily as prescribed by coumadin clinic 140 tablet 0   amiodarone (PACERONE) 200 MG tablet Take 1 tablet (200 mg total) by mouth daily. (Patient not taking: Reported on 01/05/2024) 90 tablet 3      No current facility-administered medications for this visit.        REVIEW OF SYSTEMS:  [X]  denotes positive finding, [ ]  denotes negative finding Cardiac   Comments:  Chest pain or chest pressure:      Shortness of breath upon exertion:      Short of breath when lying flat:      Irregular heart rhythm:             Vascular      Pain in calf, thigh, or hip brought on by ambulation:      Pain in feet at night that wakes you up from your sleep:       Blood clot in your veins:      Leg swelling:              Pulmonary      Oxygen at home:      Productive cough:       Wheezing:              Neurologic      Sudden weakness in arms or legs:       Sudden numbness in arms or legs:       Sudden onset of difficulty speaking or slurred speech:      Temporary loss of vision in one eye:       Problems with  dizziness:              Gastrointestinal      Blood in stool:       Vomited blood:              Genitourinary      Burning when urinating:       Blood in urine:             Psychiatric      Major depression:              Hematologic      Bleeding problems:      Problems with blood clotting too easily:             Skin      Rashes or ulcers:             Constitutional      Fever or chills:          PHYSICAL EXAM:    Vitals:    01/05/24 1515  BP: (!) 158/97  Pulse: 80  Resp: 20  Temp: 98.4 F (36.9 C)  TempSrc: Temporal  SpO2: 94%  Weight: 203 lb 14.4 oz (92.5 kg)  Height: 5\' 10"  (1.778 m)      GENERAL: The patient is a well-nourished male, in no acute distress. The vital signs are documented above. CARDIAC: There is a regular rate and rhythm.  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 NEUROLOGIC: No focal weakness or paresthesias are detected.   PSYCHIATRIC: The patient has a normal affect.      DATA:    N/a   Assessment/Plan:   58 y.o. male, with history of CHF, ESRD, HIV, hypertension, hyperlipidemia that presents for evaluation of a thinning area with scab over left arm brachiobasilic access.  Patient has a left basilic vein fistula placed in 2019.  Given the ulcer with thinning skin as pictured above I have recommended left arm AV fistula revision.  I discussed plicating the aneurysm and resecting the overlying ulcerated skin.  I discussed this will need at least 1 month to heal.  I suspect he will require tunneled dialysis catheter placement while this heals.  Risk and effects discussed.  Will need to hold his Coumadin.  [facility]  All questions answered.  He understands this is to limit bleeding risk long-term from  erosion of the ulcer into the fistula.     Cephus Shelling, MD [facility] Office: [phone]

## 2024-01-20 NOTE — Discharge Instructions (Addendum)
   [facility]  Discharge Instructions  AV Fistula or Graft Surgery for Dialysis Access  Please refer to the following instructions for your post-procedure care. Your surgeon or physician assistant will discuss any changes with you.  Activity  You may drive the day following your surgery, if you are comfortable and no longer taking prescription pain medication. Resume full activity as the soreness in your incision resolves.  Bathing/Showering  You may shower after you go home. Keep your incision dry for 48 hours. Do not soak in a bathtub, hot tub, or swim until the incision heals completely. You may not shower if you have a hemodialysis catheter.  Incision Care  Clean your incision with mild soap and water after 48 hours. Pat the area dry with a clean towel. You do not need a bandage unless otherwise instructed. Do not apply any ointments or creams to your incision. You may have skin glue on your incision. Do not peel it off. It will come off on its own in about one week. Your arm may swell a bit after surgery. To reduce swelling use pillows to elevate your arm so it is above your heart. Your doctor will tell you if you need to lightly wrap your arm with an ACE bandage.  Diet  Resume your normal diet. There are not special food restrictions following this procedure. In order to heal from your surgery, it is CRITICAL to get adequate nutrition. Your body requires vitamins, minerals, and protein. Vegetables are the best source of vitamins and minerals. Vegetables also provide the perfect balance of protein. Processed food has little nutritional value, so try to avoid this.  Medications  Resume taking all of your medications. If your incision is causing pain, you may take over-the counter pain relievers such as acetaminophen (Tylenol). If you were prescribed a stronger pain medication, please be aware these medications can cause nausea and constipation. Prevent  nausea by taking the medication with a snack or meal. Avoid constipation by drinking plenty of fluids and eating foods with high amount of fiber, such as fruits, vegetables, and grains.  Do not take Tylenol if you are taking prescription pain medications.  Follow up Your surgeon may want to see you in the office following your access surgery. If so, this will be arranged at the time of your surgery.  Please call us immediately for any of the following conditions:  Increased pain, redness, drainage (pus) from your incision site Fever of 101 degrees or higher Severe or worsening pain at your incision site Hand pain or numbness.  Reduce your risk of vascular disease:  Stop smoking. If you would like help, call [facility] at 1-800-QUIT-NOW (623-291-9375) or [place] at [phone]  Manage your cholesterol Maintain a desired weight Control your diabetes Keep your blood pressure down  Dialysis  It will take several weeks to several months for your new dialysis access to be ready for use. Your surgeon will determine when it is okay to use it. Your nephrologist will continue to direct your dialysis. You can continue to use your Permcath until your new access is ready for use.   01/20/2024 Albert Eaton [id number] 1966-10-01  Surgeon(s): Cephus Shelling, MD  Procedure(s): REVISON OF ARTERIOVENOUS FISTULA INSERTION OF DIALYSIS CATHETER USING 19cm PALINDROME CHRONIC CATHETER INTO THE RIGHT INTERNAL JUGULAR ULTRASOUND GUIDANCE  x Do not stick fistula for 4 weeks    If you have any questions, please call the office at [phone].

## 2024-01-20 NOTE — Transfer of Care (Signed)
 Immediate Anesthesia Transfer of Care Note  Patient: Albert Eaton  Procedure(s) Performed: REVISON OF ARTERIOVENOUS FISTULA WITH ARTERIAL PLICATION (Left: Arm Upper) INSERTION OF DIALYSIS CATHETER USING 19cm PALINDROME CHRONIC CATHETER INTO THE RIGHT INTERNAL JUGULAR (Right: Neck) ULTRASOUND GUIDANCE  Patient Location: PACU  Anesthesia Type:General  Level of Consciousness: awake, drowsy, and patient cooperative  Airway & Oxygen Therapy: Patient Spontanous Breathing and Patient connected to face mask oxygen  Post-op Assessment: Report given to RN and Post -op Vital signs reviewed and stable  Post vital signs: Reviewed and stable  Last Vitals:  Vitals Value Taken Time  BP 113/79 01/20/24 0931  Temp    Pulse 70 01/20/24 0933  Resp 13 01/20/24 0933  SpO2 100 % 01/20/24 0933  Vitals shown include unfiled device data.  Last Pain:  Vitals:   01/20/24 0611  TempSrc:   PainSc: 0-No pain         Complications: No notable events documented.

## 2024-01-20 NOTE — Op Note (Signed)
 Date: Engh 26, 2025  Preoperative diagnosis: End-stage renal disease with aneurysmal and ulcerated left arm brachiobasilic AV fistula  Postoperative diagnosis: Same  Procedure: 1.  Ultrasound-guided access right internal jugular vein 2.  Placement of right internal jugular vein tunneled dialysis catheter (19 cm palindrome) 3.  Revision of left arm AV fistula with resection of ulcerated skin and plication of underlying aneurysmal brachiobasilic AV fistula  Surgeon: Dr. Cephus Shelling, MD  Assistant: OR staff  Indications: 58 year old male with end-stage renal disease using a left brachiobasilic AV fistula.  Seen in the office with an aneurysmal segment with ulceration.  He presents today for left arm AV fistula revision after risks benefits discussed.  I did discuss placing a tunneled dialysis catheter in order to allow this surgical site to heal.  Findings: Ultrasound-guided access right internal jugular vein.  Placement of 19 cm palindrome catheter with tip in the right atrium.  This flushed and aspirated easily.  Then turned our attention to the left arm where I made an elliptical incision and excised all the ulcerated skin.  The underlying aneurysmal basilic vein fistula was plicated and the fistula was primarily repaired.  The skin was closed over this and dermabond was applied.  Excellent thrill at completion.  Anesthesia: General  Details: Patient was taken to the operating room after informed consent was obtained.  Placed on operative table in the supine position.  After anesthesia was induced the bilateral necks and left arm were prepped and draped in standard sterile fashion.  Antibiotics were given and timeout performed.  Initially used sterile ultrasound to evaluate the right internal jugular vein, it was patent, an image was saved.  This was accessed with a micro access needle, placed a microwire, and a microsheath.  I then advanced a J-wire into the right atrium under  fluoroscopic guidance.  I measured a 19 cm palindrome catheter on the chest wall.  I made an counterincision on the right chest wall for the exit site of the catheter.  I then tunneled the catheter from the chest wall exit site to the internal jugular vein stick site making sure the cuff was under the skin.  I then dilated over the wire and placed a large dilator peel-away sheath into the right atrium and remove the inner dilator and wire.  The catheter was then advanced into the right atrium and the sheath was peeled away.  I made sure this was not kinked under fluoroscopy.  It flushed and aspirated easily.  I then closed the IJ stick site with a 4-0 Monocryl and the catheter was secured with multiple 3-0 nylon's at the exit site.  Dermabond was applied.  It was loaded with heparin according to manufactures recommendations.  Then turned our attention to the left arm that had already been prepped and draped in the field.  I marked out an elliptical incision over the ulcerated thin skin.  We then made an incision here with a 15 blade scalpel.  Dissected down Bovie cautery and ultimately got circumferential control of the underlying aneurysmal segment of the basilic vein fistula.  I then gave the patient 8000 units of IV heparin.  I had enough mobilized to get proximal and distal control of the aneurysmal segment with fistula clamps.  I then resected the ulcerated skin on the anterior wall of the fistula and this was completely excised.  I then removed some of the additional wall of the fistula to get a more normal caliber diameter.  The anterior  wall of the fistula was then closed for plication with a 5-0 Prolene running anastomosis.  We had good hemostasis and this was de-aired prior to completion.  We then irrigated out the surgical wound.  There was a good thrill.  The subcutaneous tissue was closed with 3-0 Vicryl and 4-0 Monocryl in the skin with Dermabond.  Great thrill.  Taken to recovery in stable  condition.  Complication: None  Condition: Stable  Cephus Shelling, MD [facility] Office: [phone]   Cephus Shelling

## 2024-01-20 NOTE — Anesthesia Procedure Notes (Signed)
 Procedure Name: LMA Insertion Date/Time: 01/20/2024 7:44 AM  Performed by: Yolonda Kida, CRNAPre-anesthesia Checklist: Patient identified, Emergency Drugs available, Suction available and Patient being monitored Patient Re-evaluated:Patient Re-evaluated prior to induction Oxygen Delivery Method: Circle System Utilized Preoxygenation: Pre-oxygenation with 100% oxygen Induction Type: IV induction Ventilation: Mask ventilation without difficulty LMA: LMA inserted LMA Size: 5.0 Number of attempts: 1 Placement Confirmation: positive ETCO2 Tube secured with: Tape Dental Injury: Teeth and Oropharynx as per pre-operative assessment

## 2024-01-20 NOTE — Anesthesia Postprocedure Evaluation (Signed)
 Anesthesia Post Note  Patient: Hudson Majkowski Balla  Procedure(s) Performed: REVISON OF ARTERIOVENOUS FISTULA WITH ARTERIAL PLICATION (Left: Arm Upper) INSERTION OF DIALYSIS CATHETER USING 19cm PALINDROME CHRONIC CATHETER INTO THE RIGHT INTERNAL JUGULAR (Right: Neck) ULTRASOUND GUIDANCE     Patient location during evaluation: PACU Anesthesia Type: General Level of consciousness: awake and alert Pain management: pain level controlled Vital Signs Assessment: post-procedure vital signs reviewed and stable Respiratory status: spontaneous breathing, nonlabored ventilation, respiratory function stable and patient connected to nasal cannula oxygen Cardiovascular status: blood pressure returned to baseline and stable Postop Assessment: no apparent nausea or vomiting Anesthetic complications: no   No notable events documented.  Last Vitals:  Vitals:   01/20/24 0945 01/20/24 1000  BP: 109/81 111/80  Pulse: 72 76  Resp: 18 14  Temp:  36.7 C  SpO2: 93% 92%    Last Pain:  Vitals:   01/20/24 0930  TempSrc:   PainSc: Asleep                 Mariann Barter

## 2024-01-21 ENCOUNTER — Other Ambulatory Visit: Payer: Medicare PPO

## 2024-01-21 ENCOUNTER — Encounter (HOSPITAL_COMMUNITY): Payer: Self-pay | Admitting: Vascular Surgery

## 2024-01-21 ENCOUNTER — Other Ambulatory Visit: Payer: Self-pay

## 2024-01-21 ENCOUNTER — Telehealth: Payer: Self-pay | Admitting: Pharmacist

## 2024-01-21 ENCOUNTER — Ambulatory Visit

## 2024-01-21 DIAGNOSIS — B20 Human immunodeficiency virus [HIV] disease: Secondary | ICD-10-CM

## 2024-01-21 DIAGNOSIS — B181 Chronic viral hepatitis B without delta-agent: Secondary | ICD-10-CM

## 2024-01-21 NOTE — Telephone Encounter (Signed)
 Called patient to check on. Reports procedures went well and pt will restart coumadin tonight

## 2024-01-22 DIAGNOSIS — D689 Coagulation defect, unspecified: Secondary | ICD-10-CM | POA: Diagnosis not present

## 2024-01-22 DIAGNOSIS — T8249XD Other complication of vascular dialysis catheter, subsequent encounter: Secondary | ICD-10-CM | POA: Diagnosis not present

## 2024-01-22 DIAGNOSIS — E876 Hypokalemia: Secondary | ICD-10-CM | POA: Diagnosis not present

## 2024-01-22 DIAGNOSIS — D509 Iron deficiency anemia, unspecified: Secondary | ICD-10-CM | POA: Diagnosis not present

## 2024-01-22 DIAGNOSIS — N186 End stage renal disease: Secondary | ICD-10-CM | POA: Diagnosis not present

## 2024-01-22 DIAGNOSIS — Z992 Dependence on renal dialysis: Secondary | ICD-10-CM | POA: Diagnosis not present

## 2024-01-22 DIAGNOSIS — D631 Anemia in chronic kidney disease: Secondary | ICD-10-CM | POA: Diagnosis not present

## 2024-01-22 DIAGNOSIS — N2581 Secondary hyperparathyroidism of renal origin: Secondary | ICD-10-CM | POA: Diagnosis not present

## 2024-01-23 LAB — T-HELPER CELLS (CD4) COUNT (NOT AT ARMC)
Absolute CD4: 829 {cells}/uL (ref 490–1740)
CD4 T Helper %: 40 % (ref 30–61)
Total lymphocyte count: 2081 {cells}/uL (ref 850–3900)

## 2024-01-23 LAB — HIV-1 RNA QUANT-NO REFLEX-BLD
HIV 1 RNA Quant: NOT DETECTED {copies}/mL
HIV-1 RNA Quant, Log: NOT DETECTED {Log_copies}/mL

## 2024-01-23 LAB — HEPATITIS B SURFACE ANTIGEN: Hepatitis B Surface Ag: REACTIVE — AB

## 2024-01-25 ENCOUNTER — Ambulatory Visit
Admission: RE | Admit: 2024-01-25 | Discharge: 2024-01-25 | Disposition: A | Discharge status: Home / Self Care | Source: Ambulatory Visit | Attending: Nurse Practitioner | Admitting: Nurse Practitioner

## 2024-01-25 DIAGNOSIS — N2581 Secondary hyperparathyroidism of renal origin: Secondary | ICD-10-CM | POA: Diagnosis not present

## 2024-01-25 DIAGNOSIS — I129 Hypertensive chronic kidney disease with stage 1 through stage 4 chronic kidney disease, or unspecified chronic kidney disease: Secondary | ICD-10-CM | POA: Diagnosis not present

## 2024-01-25 DIAGNOSIS — R079 Chest pain, unspecified: Secondary | ICD-10-CM | POA: Diagnosis not present

## 2024-01-25 DIAGNOSIS — Z992 Dependence on renal dialysis: Secondary | ICD-10-CM | POA: Diagnosis not present

## 2024-01-25 DIAGNOSIS — D631 Anemia in chronic kidney disease: Secondary | ICD-10-CM | POA: Diagnosis not present

## 2024-01-25 DIAGNOSIS — N186 End stage renal disease: Secondary | ICD-10-CM | POA: Diagnosis not present

## 2024-01-25 DIAGNOSIS — D689 Coagulation defect, unspecified: Secondary | ICD-10-CM | POA: Diagnosis not present

## 2024-01-25 DIAGNOSIS — E876 Hypokalemia: Secondary | ICD-10-CM | POA: Diagnosis not present

## 2024-01-25 DIAGNOSIS — I700 Atherosclerosis of aorta: Secondary | ICD-10-CM | POA: Diagnosis not present

## 2024-01-25 DIAGNOSIS — R0782 Intercostal pain: Secondary | ICD-10-CM

## 2024-01-25 DIAGNOSIS — T8249XD Other complication of vascular dialysis catheter, subsequent encounter: Secondary | ICD-10-CM | POA: Diagnosis not present

## 2024-01-25 DIAGNOSIS — S2220XK Unspecified fracture of sternum, subsequent encounter for fracture with nonunion: Secondary | ICD-10-CM | POA: Diagnosis not present

## 2024-01-25 DIAGNOSIS — D509 Iron deficiency anemia, unspecified: Secondary | ICD-10-CM | POA: Diagnosis not present

## 2024-01-26 ENCOUNTER — Ambulatory Visit: Admitting: Nurse Practitioner

## 2024-01-27 ENCOUNTER — Encounter: Payer: Self-pay | Admitting: Nurse Practitioner

## 2024-01-27 ENCOUNTER — Ambulatory Visit

## 2024-01-27 DIAGNOSIS — G9009 Other idiopathic peripheral autonomic neuropathy: Secondary | ICD-10-CM | POA: Diagnosis not present

## 2024-01-27 DIAGNOSIS — N186 End stage renal disease: Secondary | ICD-10-CM | POA: Diagnosis not present

## 2024-01-27 DIAGNOSIS — D631 Anemia in chronic kidney disease: Secondary | ICD-10-CM | POA: Diagnosis not present

## 2024-01-27 DIAGNOSIS — Z992 Dependence on renal dialysis: Secondary | ICD-10-CM | POA: Diagnosis not present

## 2024-01-27 DIAGNOSIS — D689 Coagulation defect, unspecified: Secondary | ICD-10-CM | POA: Diagnosis not present

## 2024-01-27 DIAGNOSIS — T8249XD Other complication of vascular dialysis catheter, subsequent encounter: Secondary | ICD-10-CM | POA: Diagnosis not present

## 2024-01-27 DIAGNOSIS — D509 Iron deficiency anemia, unspecified: Secondary | ICD-10-CM | POA: Diagnosis not present

## 2024-01-27 DIAGNOSIS — N2581 Secondary hyperparathyroidism of renal origin: Secondary | ICD-10-CM | POA: Diagnosis not present

## 2024-01-27 DIAGNOSIS — E876 Hypokalemia: Secondary | ICD-10-CM | POA: Diagnosis not present

## 2024-01-28 ENCOUNTER — Encounter: Payer: Self-pay | Admitting: Nurse Practitioner

## 2024-01-28 ENCOUNTER — Other Ambulatory Visit: Payer: Self-pay | Admitting: Pulmonary Disease

## 2024-01-28 DIAGNOSIS — D631 Anemia in chronic kidney disease: Secondary | ICD-10-CM | POA: Diagnosis not present

## 2024-01-28 DIAGNOSIS — D509 Iron deficiency anemia, unspecified: Secondary | ICD-10-CM | POA: Diagnosis not present

## 2024-01-28 DIAGNOSIS — N186 End stage renal disease: Secondary | ICD-10-CM | POA: Diagnosis not present

## 2024-01-28 DIAGNOSIS — Z992 Dependence on renal dialysis: Secondary | ICD-10-CM | POA: Diagnosis not present

## 2024-01-28 DIAGNOSIS — N2581 Secondary hyperparathyroidism of renal origin: Secondary | ICD-10-CM | POA: Diagnosis not present

## 2024-01-28 DIAGNOSIS — G9009 Other idiopathic peripheral autonomic neuropathy: Secondary | ICD-10-CM | POA: Diagnosis not present

## 2024-01-28 DIAGNOSIS — E876 Hypokalemia: Secondary | ICD-10-CM | POA: Diagnosis not present

## 2024-01-28 DIAGNOSIS — T8249XD Other complication of vascular dialysis catheter, subsequent encounter: Secondary | ICD-10-CM | POA: Diagnosis not present

## 2024-01-28 DIAGNOSIS — D689 Coagulation defect, unspecified: Secondary | ICD-10-CM | POA: Diagnosis not present

## 2024-01-28 NOTE — Telephone Encounter (Signed)
 Dr. Wynona Neat,  Albert Eaton was sent for a CT of his chest on 01/25/2024 that showed a pulmonary nodule. He is concerned about this and would like your input and recommendations. I have told him I would forward you the information  Thanks for you time and help  Matt C

## 2024-02-01 DIAGNOSIS — D689 Coagulation defect, unspecified: Secondary | ICD-10-CM | POA: Diagnosis not present

## 2024-02-01 DIAGNOSIS — T8249XD Other complication of vascular dialysis catheter, subsequent encounter: Secondary | ICD-10-CM | POA: Diagnosis not present

## 2024-02-01 DIAGNOSIS — D631 Anemia in chronic kidney disease: Secondary | ICD-10-CM | POA: Diagnosis not present

## 2024-02-01 DIAGNOSIS — D509 Iron deficiency anemia, unspecified: Secondary | ICD-10-CM | POA: Diagnosis not present

## 2024-02-01 DIAGNOSIS — Z992 Dependence on renal dialysis: Secondary | ICD-10-CM | POA: Diagnosis not present

## 2024-02-01 DIAGNOSIS — E876 Hypokalemia: Secondary | ICD-10-CM | POA: Diagnosis not present

## 2024-02-01 DIAGNOSIS — G9009 Other idiopathic peripheral autonomic neuropathy: Secondary | ICD-10-CM | POA: Diagnosis not present

## 2024-02-01 DIAGNOSIS — N2581 Secondary hyperparathyroidism of renal origin: Secondary | ICD-10-CM | POA: Diagnosis not present

## 2024-02-01 DIAGNOSIS — N186 End stage renal disease: Secondary | ICD-10-CM | POA: Diagnosis not present

## 2024-02-02 ENCOUNTER — Ambulatory Visit: Attending: Cardiology

## 2024-02-02 DIAGNOSIS — Z7901 Long term (current) use of anticoagulants: Secondary | ICD-10-CM

## 2024-02-02 DIAGNOSIS — I480 Paroxysmal atrial fibrillation: Secondary | ICD-10-CM

## 2024-02-02 DIAGNOSIS — Z9889 Other specified postprocedural states: Secondary | ICD-10-CM | POA: Diagnosis not present

## 2024-02-02 DIAGNOSIS — I4892 Unspecified atrial flutter: Secondary | ICD-10-CM

## 2024-02-02 LAB — POCT INR: INR: 2 (ref 2.0–3.0)

## 2024-02-02 NOTE — Patient Instructions (Signed)
 Continue taking 2 tablets daily except 1 tablet every Monday, Wednesday and Friday.   Recheck INR 4 weeks  [facility] [phone]

## 2024-02-03 DIAGNOSIS — E876 Hypokalemia: Secondary | ICD-10-CM | POA: Diagnosis not present

## 2024-02-03 DIAGNOSIS — D689 Coagulation defect, unspecified: Secondary | ICD-10-CM | POA: Diagnosis not present

## 2024-02-03 DIAGNOSIS — N186 End stage renal disease: Secondary | ICD-10-CM | POA: Diagnosis not present

## 2024-02-03 DIAGNOSIS — G9009 Other idiopathic peripheral autonomic neuropathy: Secondary | ICD-10-CM | POA: Diagnosis not present

## 2024-02-03 DIAGNOSIS — T8249XD Other complication of vascular dialysis catheter, subsequent encounter: Secondary | ICD-10-CM | POA: Diagnosis not present

## 2024-02-03 DIAGNOSIS — Z992 Dependence on renal dialysis: Secondary | ICD-10-CM | POA: Diagnosis not present

## 2024-02-03 DIAGNOSIS — N2581 Secondary hyperparathyroidism of renal origin: Secondary | ICD-10-CM | POA: Diagnosis not present

## 2024-02-03 DIAGNOSIS — D509 Iron deficiency anemia, unspecified: Secondary | ICD-10-CM | POA: Diagnosis not present

## 2024-02-03 DIAGNOSIS — D631 Anemia in chronic kidney disease: Secondary | ICD-10-CM | POA: Diagnosis not present

## 2024-02-04 ENCOUNTER — Other Ambulatory Visit: Payer: Self-pay

## 2024-02-04 ENCOUNTER — Ambulatory Visit: Payer: Self-pay | Admitting: Internal Medicine

## 2024-02-04 ENCOUNTER — Ambulatory Visit: Admitting: Pharmacist

## 2024-02-04 DIAGNOSIS — Z23 Encounter for immunization: Secondary | ICD-10-CM | POA: Diagnosis not present

## 2024-02-04 NOTE — Progress Notes (Signed)
 HPI: Albert Eaton is a 58 y.o. male who presents to the [facility] pharmacy clinic for HIV follow-up.  Patient Active Problem List   Diagnosis Date Noted   History of pneumonia 12/31/2023   Tachycardia 12/31/2023   Long term (current) use of anticoagulants 11/05/2023   ESRD (end stage renal disease) (HCC) 10/29/2023   Atrial flutter, unspecified type (HCC) 10/29/2023   Precordial pain 02/17/2023   Heart failure with mildly reduced ejection fraction (HFmrEF) (HCC) 02/17/2023   Hypotension 02/17/2023   Ascending aorta dilation (HCC) 02/17/2023   CAD (coronary artery disease) 02/16/2023   Chronic viral hepatitis B without delta-agent (HCC) 01/06/2023   Cancer screening 01/06/2023   Vitamin D deficiency 01/01/2023   Acute cough 11/25/2022   Influenza A 11/25/2022   Other headache syndrome 11/25/2022   Multiple fractures of ribs, bilateral, initial encounter for closed fracture 09/16/2022   Cardiac arrest (HCC) 09/08/2022   Acute cerebrovascular insufficiency 08/28/2022   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 08/28/2022   Cerebrovascular accident (CVA) due to embolism of left cerebellar artery (HCC)    S/P MVR (mitral valve repair) 08/21/2022   Chronic pain of right ankle 06/26/2022   Autonomic neuropathy in diseases classified elsewhere 04/21/2022   PAF (paroxysmal atrial fibrillation) (HCC) 12/02/2021   Laryngopharyngeal reflux (LPR) 11/11/2021   Chest wall pain    Elevated troponin    NSTEMI (non-ST elevated myocardial infarction) (HCC) 09/03/2021   Essential hypertension 09/03/2021   Renal cell carcinoma (HCC) 06/27/2021   Generalized (acute) peritonitis (HCC) 06/24/2021   Other disorders of bilirubin metabolism 06/24/2021   Renal mass 02/18/2021   Pain, unspecified 02/08/2021   SVT (supraventricular tachycardia) (HCC) 01/18/2021   Lower abdominal pain 06/26/2020   Contact with and (suspected) exposure to tuberculosis 04/02/2020   Fluid overload,  unspecified 04/02/2020   Pulsatile tinnitus of right ear 11/21/2019   Kidney failure 11/21/2019   Acute diastolic CHF (congestive heart failure) (HCC) 11/21/2019   Fever and chills 11/07/2019   Hypercalcemia 10/17/2019   Dependence on renal dialysis (HCC) 06/06/2019   Coagulation defect, unspecified (HCC) 05/23/2019   Iron deficiency anemia, unspecified 05/16/2019   Anemia in other chronic diseases classified elsewhere 05/13/2019   Arteriovenous fistula, acquired (HCC) 05/13/2019   Body mass index (BMI) 28.0-28.9, adult 05/13/2019   Crohn's disease of small intestine without complications (HCC) 05/13/2019   Encounter for general adult medical examination with abnormal findings 05/13/2019   Gout, unspecified 05/13/2019   Nausea 05/13/2019   Other fatigue 05/13/2019   Secondary hyperparathyroidism of renal origin (HCC) 05/13/2019   ESRD (end stage renal disease) on dialysis (HCC) 05/13/2019   Chronic diastolic (congestive) heart failure (HCC) 05/13/2019   Chronic kidney disease, unspecified 05/13/2019   Obstructive sleep apnea 05/13/2019   Discomfort of right ear 02/18/2019   Hypokalemia 06/13/2018   Abnormal CT scan, small bowel    Perennial allergic rhinitis with a predominant nonallergic component 02/09/2018   Allergic conjunctivitis 02/09/2018   Mild intermittent asthma 02/09/2018   Atherosclerosis 12/09/2017   Hypothyroidism 11/19/2017   Mixed hyperlipidemia 10/13/2017   Personal history of gout 10/13/2017   S/P cholecystectomy 10/13/2017   Congestive heart failure with LV diastolic dysfunction, NYHA class 1 (HCC) 10/13/2017   Hypothyroidism (acquired) 10/13/2017   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/31/2017   SOB (shortness of breath) 06/07/2017   Arthritis, gouty 01/27/2017   Chronic diastolic CHF (congestive heart failure) (HCC) 01/06/2017   OSA (obstructive sleep apnea) 01/06/2017   Abnormal electrocardiogram (  ECG) (EKG) 10/31/2016   Pleuritic chest pain 10/09/2016   HIV  disease (HCC) 10/09/2016   Hypertensive heart disease with CHF (congestive heart failure) (HCC) 10/09/2016   Lumbar back pain 05/29/2015   NYHA class 2 heart failure with preserved ejection fraction (HCC) 05/15/2015   PAH (pulmonary artery hypertension) (HCC) 05/08/2015   Severe mitral regurgitation 05/04/2015   Prolonged Q-T interval on ECG 05/04/2015    Patient's Medications  New Prescriptions   No medications on file  Previous Medications   ALLOPURINOL (ZYLOPRIM) 100 MG TABLET    Take 1 tablet (100 mg total) by mouth daily.   AMLODIPINE (NORVASC) 2.5 MG TABLET    Take 1 tablet (2.5 mg total) by mouth daily.   AZELASTINE (ASTELIN) 0.1 % NASAL SPRAY    Place 2 sprays into both nostrils 2 (two) times daily as needed for allergies.   B COMPLEX-C-ZN-FOLIC ACID (DIALYVITE 800/ZINC PO)    Take 1 tablet by mouth daily.   BICTEGRAVIR-EMTRICITABINE-TENOFOVIR AF (BIKTARVY) 50-200-25 MG TABS TABLET    Take 1 tablet by mouth daily.   CETIRIZINE (ZYRTEC) 10 MG TABLET    TAKE ONE TABLET BY MOUTH DAILY AT 9 a.m.   CINACALCET (SENSIPAR) 90 MG TABLET    Take 90 mg by mouth daily.   DICLOFENAC SODIUM (VOLTAREN) 1 % GEL    Apply 2 g topically 4 (four) times daily. Apply to chest pain when lidocaine patch not in place   FAMOTIDINE (PEPCID) 20 MG TABLET    Take 1 tablet (20 mg total) by mouth 2 (two) times daily.   FERRIC CITRATE (AURYXIA) 1 GM 210 MG(FE) TABLET    Take 420 mg by mouth See admin instructions. Take 420 mg by mouth three times a day with meals and 210 mg with each snack   FLUTICASONE (FLOVENT HFA) 110 MCG/ACT INHALER    Inhale 2 puffs into the lungs 2 (two) times daily as needed ("for flares").   GABAPENTIN (NEURONTIN) 300 MG CAPSULE    Take 1 capsule (300 mg total) by mouth daily as needed (pain/neuropathy).   HYDROCODONE-ACETAMINOPHEN (NORCO/VICODIN) 5-325 MG TABLET    Take 1 tablet by mouth every 6 (six) hours as needed.   IPRATROPIUM (ATROVENT) 0.06 % NASAL SPRAY    USE TWO SPRAYS IN EACH  NOSTRIL 2-3 times daily AS NEEDED   IRON SUCROSE (VENOFER) 20 MG/ML INJECTION    Inject into the vein 3 (three) times a week.   LACTULOSE (CHRONULAC) 10 GM/15ML SOLUTION    Take 45 mLs (30 g total) by mouth daily as needed. Take 15-30 ml up to twice daily for constipation   LEVALBUTEROL (XOPENEX HFA) 45 MCG/ACT INHALER    Inhale 2 puffs into the lungs every 4 (four) hours as needed for wheezing.   LEVOTHYROXINE (SYNTHROID) 25 MCG TABLET    Take 1 tablet (25 mcg total) by mouth daily before breakfast.   LUBIPROSTONE (AMITIZA) 24 MCG CAPSULE    Take 1 capsule (24 mcg total) by mouth 2 (two) times daily with a meal. NEEDS APPT FOR FURTHER REFILLS   METHOXY PEG-EPOETIN BETA (MIRCERA IJ)    every 30 (thirty) days.   METOPROLOL TARTRATE (LOPRESSOR) 25 MG TABLET    Take 1 tablet (25 mg total) by mouth 2 (two) times daily.   OLOPATADINE HCL (PATADAY) 0.7 % SOLN    Place 1 drop into both eyes 2 (two) times daily as needed (allergies).   PANTOPRAZOLE (PROTONIX) 40 MG TABLET    Take 1 tablet (40  mg total) by mouth daily.   ROPINIROLE (REQUIP) 1 MG TABLET    Take 1 tablet (1 mg total) by mouth at bedtime.   ROSUVASTATIN (CRESTOR) 10 MG TABLET    Take 1 tablet (10 mg total) by mouth daily.   SEVELAMER CARBONATE (RENVELA) 800 MG TABLET    Take 2,400 mg by mouth daily.   SPACER/AERO-HOLDING CHAMBERS DEVI    1 Device by Does not apply route 2 (two) times a day.   SYMBICORT 160-4.5 MCG/ACT INHALER    Inhale 2 puffs into the lungs in the morning and at bedtime.   TIZANIDINE (ZANAFLEX) 2 MG TABLET    Take 1 tablet (2 mg total) by mouth 2 (two) times daily as needed for muscle spasms.   VITAMIN D, ERGOCALCIFEROL, (DRISDOL) 1.25 MG (50000 UNIT) CAPS CAPSULE    Take 1 capsule (50,000 Units total) by mouth every Tuesday.   WARFARIN (COUMADIN) 5 MG TABLET    Take 1 tablet to 2 tablets by mouth daily as prescribed by coumadin clinic  Modified Medications   No medications on file  Discontinued Medications   No  medications on file    Labs: Lab Results  Component Value Date   HIV1RNAQUANT NOT DETECTED 01/21/2024   HIV1RNAQUANT <20 (H) 07/09/2023   HIV1RNAQUANT Not Detected 04/17/2022   HIV1RNAVL <20 11/13/2022   CD4TABS 860 07/09/2023   CD4TABS 560 12/02/2022   CD4TABS 694 09/08/2022    RPR and STI Lab Results  Component Value Date   LABRPR NON-REACTIVE 07/09/2023   LABRPR NON-REACTIVE 12/02/2022   LABRPR NON-REACTIVE 04/17/2022   LABRPR NON-REACTIVE 06/27/2021   LABRPR NON-REACTIVE 06/27/2020   RPRTITER 1:1 (H) 02/17/2018    STI Results GC GC CT CT  06/27/2021  4:02 PM Negative   Negative    10/06/2019 12:00 AM Negative   Negative    11/22/2018 12:00 AM Negative   Negative    02/17/2018 12:00 AM Negative   Negative    05/25/2017 12:00 AM Negative   Negative    12/25/2016  2:17 PM  NOT DETECTED   NOT DETECTED   10/15/2016 12:00 AM Negative   Negative      Hepatitis B Lab Results  Component Value Date   HEPBSAB NON REACTIVE 12/02/2021   HEPBSAG REACTIVE (A) 01/21/2024   Hepatitis C No results found for: "HEPCAB", "HCVRNAPCRQN" Hepatitis A Lab Results  Component Value Date   HAV REACTIVE (A) 10/15/2016   Lipids: Lab Results  Component Value Date   CHOL 188 08/07/2023   TRIG 169 (H) 08/07/2023   HDL 36 (L) 08/07/2023   CHOLHDL 5.2 (H) 08/07/2023   VLDL 64 (H) 09/04/2021   LDLCALC 122 (H) 08/07/2023    Current HIV Regimen: Biktarvy  Assessment: Albert Eaton is a 58 yo male here for an HIV follow-up appointment. He remains on biktarvy with no reported side effects or issues with obtaining or taking the medication. Last HIV RNA was undetectable CD4 was 829 in 12/2023. All routine labs performed in 12/2023. Remains on dialysis and is on the waitlist for a kidney transplant.  Immunizations: He is eligible for PCV20 vaccination today, which he is agreeable to.   Plan: - Continue Biktarvy daily - Prevnar 20 administered to right deltoid - F/u on 08/11/2024 with Dr.  Thedore Mins - Call with any questions or concerns  Thank you for involving pharmacy in the patient's care.   Theotis Burrow, PharmD PGY1 Acute Care Pharmacy Resident  02/04/2024 9:27 AM

## 2024-02-05 DIAGNOSIS — D509 Iron deficiency anemia, unspecified: Secondary | ICD-10-CM | POA: Diagnosis not present

## 2024-02-05 DIAGNOSIS — D631 Anemia in chronic kidney disease: Secondary | ICD-10-CM | POA: Diagnosis not present

## 2024-02-05 DIAGNOSIS — N2581 Secondary hyperparathyroidism of renal origin: Secondary | ICD-10-CM | POA: Diagnosis not present

## 2024-02-05 DIAGNOSIS — D689 Coagulation defect, unspecified: Secondary | ICD-10-CM | POA: Diagnosis not present

## 2024-02-05 DIAGNOSIS — G9009 Other idiopathic peripheral autonomic neuropathy: Secondary | ICD-10-CM | POA: Diagnosis not present

## 2024-02-05 DIAGNOSIS — E876 Hypokalemia: Secondary | ICD-10-CM | POA: Diagnosis not present

## 2024-02-05 DIAGNOSIS — Z992 Dependence on renal dialysis: Secondary | ICD-10-CM | POA: Diagnosis not present

## 2024-02-05 DIAGNOSIS — N186 End stage renal disease: Secondary | ICD-10-CM | POA: Diagnosis not present

## 2024-02-05 DIAGNOSIS — T8249XD Other complication of vascular dialysis catheter, subsequent encounter: Secondary | ICD-10-CM | POA: Diagnosis not present

## 2024-02-08 DIAGNOSIS — D631 Anemia in chronic kidney disease: Secondary | ICD-10-CM | POA: Diagnosis not present

## 2024-02-08 DIAGNOSIS — E876 Hypokalemia: Secondary | ICD-10-CM | POA: Diagnosis not present

## 2024-02-08 DIAGNOSIS — Z992 Dependence on renal dialysis: Secondary | ICD-10-CM | POA: Diagnosis not present

## 2024-02-08 DIAGNOSIS — D689 Coagulation defect, unspecified: Secondary | ICD-10-CM | POA: Diagnosis not present

## 2024-02-08 DIAGNOSIS — G9009 Other idiopathic peripheral autonomic neuropathy: Secondary | ICD-10-CM | POA: Diagnosis not present

## 2024-02-08 DIAGNOSIS — D509 Iron deficiency anemia, unspecified: Secondary | ICD-10-CM | POA: Diagnosis not present

## 2024-02-08 DIAGNOSIS — T8249XD Other complication of vascular dialysis catheter, subsequent encounter: Secondary | ICD-10-CM | POA: Diagnosis not present

## 2024-02-08 DIAGNOSIS — N2581 Secondary hyperparathyroidism of renal origin: Secondary | ICD-10-CM | POA: Diagnosis not present

## 2024-02-08 DIAGNOSIS — N186 End stage renal disease: Secondary | ICD-10-CM | POA: Diagnosis not present

## 2024-02-10 DIAGNOSIS — E876 Hypokalemia: Secondary | ICD-10-CM | POA: Diagnosis not present

## 2024-02-10 DIAGNOSIS — G9009 Other idiopathic peripheral autonomic neuropathy: Secondary | ICD-10-CM | POA: Diagnosis not present

## 2024-02-10 DIAGNOSIS — D689 Coagulation defect, unspecified: Secondary | ICD-10-CM | POA: Diagnosis not present

## 2024-02-10 DIAGNOSIS — D509 Iron deficiency anemia, unspecified: Secondary | ICD-10-CM | POA: Diagnosis not present

## 2024-02-10 DIAGNOSIS — D631 Anemia in chronic kidney disease: Secondary | ICD-10-CM | POA: Diagnosis not present

## 2024-02-10 DIAGNOSIS — T8249XD Other complication of vascular dialysis catheter, subsequent encounter: Secondary | ICD-10-CM | POA: Diagnosis not present

## 2024-02-10 DIAGNOSIS — N2581 Secondary hyperparathyroidism of renal origin: Secondary | ICD-10-CM | POA: Diagnosis not present

## 2024-02-10 DIAGNOSIS — Z992 Dependence on renal dialysis: Secondary | ICD-10-CM | POA: Diagnosis not present

## 2024-02-10 DIAGNOSIS — N186 End stage renal disease: Secondary | ICD-10-CM | POA: Diagnosis not present

## 2024-02-12 DIAGNOSIS — D509 Iron deficiency anemia, unspecified: Secondary | ICD-10-CM | POA: Diagnosis not present

## 2024-02-12 DIAGNOSIS — E876 Hypokalemia: Secondary | ICD-10-CM | POA: Diagnosis not present

## 2024-02-12 DIAGNOSIS — D631 Anemia in chronic kidney disease: Secondary | ICD-10-CM | POA: Diagnosis not present

## 2024-02-12 DIAGNOSIS — N186 End stage renal disease: Secondary | ICD-10-CM | POA: Diagnosis not present

## 2024-02-12 DIAGNOSIS — N2581 Secondary hyperparathyroidism of renal origin: Secondary | ICD-10-CM | POA: Diagnosis not present

## 2024-02-12 DIAGNOSIS — T8249XD Other complication of vascular dialysis catheter, subsequent encounter: Secondary | ICD-10-CM | POA: Diagnosis not present

## 2024-02-12 DIAGNOSIS — G9009 Other idiopathic peripheral autonomic neuropathy: Secondary | ICD-10-CM | POA: Diagnosis not present

## 2024-02-12 DIAGNOSIS — Z992 Dependence on renal dialysis: Secondary | ICD-10-CM | POA: Diagnosis not present

## 2024-02-12 DIAGNOSIS — D689 Coagulation defect, unspecified: Secondary | ICD-10-CM | POA: Diagnosis not present

## 2024-02-15 DIAGNOSIS — N2581 Secondary hyperparathyroidism of renal origin: Secondary | ICD-10-CM | POA: Diagnosis not present

## 2024-02-15 DIAGNOSIS — E876 Hypokalemia: Secondary | ICD-10-CM | POA: Diagnosis not present

## 2024-02-15 DIAGNOSIS — T8249XD Other complication of vascular dialysis catheter, subsequent encounter: Secondary | ICD-10-CM | POA: Diagnosis not present

## 2024-02-15 DIAGNOSIS — N186 End stage renal disease: Secondary | ICD-10-CM | POA: Diagnosis not present

## 2024-02-15 DIAGNOSIS — G9009 Other idiopathic peripheral autonomic neuropathy: Secondary | ICD-10-CM | POA: Diagnosis not present

## 2024-02-15 DIAGNOSIS — Z992 Dependence on renal dialysis: Secondary | ICD-10-CM | POA: Diagnosis not present

## 2024-02-15 DIAGNOSIS — D631 Anemia in chronic kidney disease: Secondary | ICD-10-CM | POA: Diagnosis not present

## 2024-02-15 DIAGNOSIS — D689 Coagulation defect, unspecified: Secondary | ICD-10-CM | POA: Diagnosis not present

## 2024-02-15 DIAGNOSIS — D509 Iron deficiency anemia, unspecified: Secondary | ICD-10-CM | POA: Diagnosis not present

## 2024-02-17 DIAGNOSIS — G9009 Other idiopathic peripheral autonomic neuropathy: Secondary | ICD-10-CM | POA: Diagnosis not present

## 2024-02-17 DIAGNOSIS — Z992 Dependence on renal dialysis: Secondary | ICD-10-CM | POA: Diagnosis not present

## 2024-02-17 DIAGNOSIS — T8249XD Other complication of vascular dialysis catheter, subsequent encounter: Secondary | ICD-10-CM | POA: Diagnosis not present

## 2024-02-17 DIAGNOSIS — D631 Anemia in chronic kidney disease: Secondary | ICD-10-CM | POA: Diagnosis not present

## 2024-02-17 DIAGNOSIS — E876 Hypokalemia: Secondary | ICD-10-CM | POA: Diagnosis not present

## 2024-02-17 DIAGNOSIS — N186 End stage renal disease: Secondary | ICD-10-CM | POA: Diagnosis not present

## 2024-02-17 DIAGNOSIS — N2581 Secondary hyperparathyroidism of renal origin: Secondary | ICD-10-CM | POA: Diagnosis not present

## 2024-02-17 DIAGNOSIS — D509 Iron deficiency anemia, unspecified: Secondary | ICD-10-CM | POA: Diagnosis not present

## 2024-02-17 DIAGNOSIS — D689 Coagulation defect, unspecified: Secondary | ICD-10-CM | POA: Diagnosis not present

## 2024-02-19 DIAGNOSIS — D689 Coagulation defect, unspecified: Secondary | ICD-10-CM | POA: Diagnosis not present

## 2024-02-19 DIAGNOSIS — T8249XD Other complication of vascular dialysis catheter, subsequent encounter: Secondary | ICD-10-CM | POA: Diagnosis not present

## 2024-02-19 DIAGNOSIS — E876 Hypokalemia: Secondary | ICD-10-CM | POA: Diagnosis not present

## 2024-02-19 DIAGNOSIS — D509 Iron deficiency anemia, unspecified: Secondary | ICD-10-CM | POA: Diagnosis not present

## 2024-02-19 DIAGNOSIS — G9009 Other idiopathic peripheral autonomic neuropathy: Secondary | ICD-10-CM | POA: Diagnosis not present

## 2024-02-19 DIAGNOSIS — N2581 Secondary hyperparathyroidism of renal origin: Secondary | ICD-10-CM | POA: Diagnosis not present

## 2024-02-19 DIAGNOSIS — Z992 Dependence on renal dialysis: Secondary | ICD-10-CM | POA: Diagnosis not present

## 2024-02-19 DIAGNOSIS — N186 End stage renal disease: Secondary | ICD-10-CM | POA: Diagnosis not present

## 2024-02-19 DIAGNOSIS — D631 Anemia in chronic kidney disease: Secondary | ICD-10-CM | POA: Diagnosis not present

## 2024-02-22 DIAGNOSIS — N186 End stage renal disease: Secondary | ICD-10-CM | POA: Diagnosis not present

## 2024-02-22 DIAGNOSIS — T8249XD Other complication of vascular dialysis catheter, subsequent encounter: Secondary | ICD-10-CM | POA: Diagnosis not present

## 2024-02-22 DIAGNOSIS — D689 Coagulation defect, unspecified: Secondary | ICD-10-CM | POA: Diagnosis not present

## 2024-02-22 DIAGNOSIS — D631 Anemia in chronic kidney disease: Secondary | ICD-10-CM | POA: Diagnosis not present

## 2024-02-22 DIAGNOSIS — E876 Hypokalemia: Secondary | ICD-10-CM | POA: Diagnosis not present

## 2024-02-22 DIAGNOSIS — N2581 Secondary hyperparathyroidism of renal origin: Secondary | ICD-10-CM | POA: Diagnosis not present

## 2024-02-22 DIAGNOSIS — G9009 Other idiopathic peripheral autonomic neuropathy: Secondary | ICD-10-CM | POA: Diagnosis not present

## 2024-02-22 DIAGNOSIS — D509 Iron deficiency anemia, unspecified: Secondary | ICD-10-CM | POA: Diagnosis not present

## 2024-02-22 DIAGNOSIS — Z992 Dependence on renal dialysis: Secondary | ICD-10-CM | POA: Diagnosis not present

## 2024-02-23 ENCOUNTER — Ambulatory Visit: Attending: Vascular Surgery | Admitting: Physician Assistant

## 2024-02-23 VITALS — BP 151/111 | HR 138 | Temp 98.3°F | Resp 18 | Ht 70.0 in | Wt 202.3 lb

## 2024-02-23 DIAGNOSIS — N186 End stage renal disease: Secondary | ICD-10-CM

## 2024-02-23 DIAGNOSIS — Z992 Dependence on renal dialysis: Secondary | ICD-10-CM

## 2024-02-23 NOTE — Progress Notes (Signed)
 POST OPERATIVE OFFICE NOTE    CC:  F/u for surgery  HPI:  Indications: 58 year old male with end-stage renal disease using a left brachiobasilic AV fistula. Seen in the office with an aneurysmal segment with ulceration.   s/p right internal jugular TDC and Revision of left arm AV fistula with resection of ulcerated skin and plication of underlying aneurysmal brachiobasilic AV fistula  on 01/20/24 by Dr. Fulton Job.    Pt returns today for follow up.  Pt denies steal symptoms.  No weakness or loss of sensation in the left UE.  He is followed by Dr. Christianne Cowper.   Allergies  Allergen Reactions   Ace Inhibitors Cough and Other (See Comments)    Current Outpatient Medications  Medication Sig Dispense Refill   levalbuterol  (XOPENEX  HFA) 45 MCG/ACT inhaler Inhale 2 puffs into the lungs every 4 (four) hours as needed for wheezing. 15 g 1   allopurinol  (ZYLOPRIM ) 100 MG tablet Take 1 tablet (100 mg total) by mouth daily. 90 tablet 0   amLODipine  (NORVASC ) 2.5 MG tablet Take 1 tablet (2.5 mg total) by mouth daily. 90 tablet 1   azelastine  (ASTELIN ) 0.1 % nasal spray Place 2 sprays into both nostrils 2 (two) times daily as needed for allergies. 30 mL 0   B Complex-C-Zn-Folic Acid  (DIALYVITE  800/ZINC  PO) Take 1 tablet by mouth daily.     bictegravir-emtricitabine -tenofovir  AF (BIKTARVY ) 50-200-25 MG TABS tablet Take 1 tablet by mouth daily. 30 tablet 11   cetirizine  (ZYRTEC ) 10 MG tablet TAKE ONE TABLET BY MOUTH DAILY AT 9 a.m. 90 tablet 1   cinacalcet  (SENSIPAR ) 90 MG tablet Take 90 mg by mouth daily.     diclofenac  Sodium (VOLTAREN ) 1 % GEL Apply 2 g topically 4 (four) times daily. Apply to chest pain when lidocaine  patch not in place (Patient taking differently: Apply 2 g topically 4 (four) times daily as needed (pain).) 150 g 0   famotidine  (PEPCID ) 20 MG tablet Take 1 tablet (20 mg total) by mouth 2 (two) times daily. 180 tablet 3   ferric citrate  (AURYXIA ) 1 GM 210 MG(Fe) tablet Take 420 mg by mouth See  admin instructions. Take 420 mg by mouth three times a day with meals and 210 mg with each snack     fluticasone  (FLOVENT  HFA) 110 MCG/ACT inhaler Inhale 2 puffs into the lungs 2 (two) times daily as needed ("for flares").     gabapentin  (NEURONTIN ) 300 MG capsule Take 1 capsule (300 mg total) by mouth daily as needed (pain/neuropathy).     HYDROcodone -acetaminophen  (NORCO/VICODIN) 5-325 MG tablet Take 1 tablet by mouth every 6 (six) hours as needed. 12 tablet 0   ipratropium (ATROVENT ) 0.06 % nasal spray USE TWO SPRAYS IN EACH NOSTRIL 2-3 times daily AS NEEDED 15 mL 5   iron  sucrose (VENOFER ) 20 MG/ML injection Inject into the vein 3 (three) times a week.     lactulose  (CHRONULAC ) 10 GM/15ML solution Take 45 mLs (30 g total) by mouth daily as needed. Take 15-30 ml up to twice daily for constipation (Patient taking differently: Take 40 g by mouth daily as needed.) 1800 mL 11   levothyroxine  (SYNTHROID ) 25 MCG tablet Take 1 tablet (25 mcg total) by mouth daily before breakfast. 90 tablet 1   lubiprostone  (AMITIZA ) 24 MCG capsule Take 1 capsule (24 mcg total) by mouth 2 (two) times daily with a meal. NEEDS APPT FOR FURTHER REFILLS 180 capsule 0   Methoxy PEG-Epoetin  Beta (MIRCERA IJ) every 30 (thirty) days.  metoprolol  tartrate (LOPRESSOR ) 25 MG tablet Take 1 tablet (25 mg total) by mouth 2 (two) times daily. 90 tablet 3   Olopatadine  HCl (PATADAY ) 0.7 % SOLN Place 1 drop into both eyes 2 (two) times daily as needed (allergies). 2.5 mL 5   pantoprazole  (PROTONIX ) 40 MG tablet Take 1 tablet (40 mg total) by mouth daily. 30 tablet 5   rOPINIRole  (REQUIP ) 1 MG tablet Take 1 tablet (1 mg total) by mouth at bedtime. 90 tablet 2   rosuvastatin  (CRESTOR ) 10 MG tablet Take 1 tablet (10 mg total) by mouth daily. 90 tablet 1   sevelamer  carbonate (RENVELA ) 800 MG tablet Take 2,400 mg by mouth daily.     Spacer/Aero-Holding Chambers DEVI 1 Device by Does not apply route 2 (two) times a day. 1 each 1    SYMBICORT  160-4.5 MCG/ACT inhaler Inhale 2 puffs into the lungs in the morning and at bedtime. (Patient taking differently: Inhale 2 puffs into the lungs 2 (two) times daily as needed (Asthma).) 10.2 g 5   tiZANidine  (ZANAFLEX ) 2 MG tablet Take 1 tablet (2 mg total) by mouth 2 (two) times daily as needed for muscle spasms. 30 tablet 0   Vitamin D , Ergocalciferol , (DRISDOL ) 1.25 MG (50000 UNIT) CAPS capsule Take 1 capsule (50,000 Units total) by mouth every Tuesday. 12 capsule 0   warfarin (COUMADIN ) 5 MG tablet Take 1 tablet to 2 tablets by mouth daily as prescribed by coumadin  clinic (Patient taking differently: Take 5-10 mg by mouth See admin instructions. Take 1 tablet (5mg ) on Monday Wednesday Friday. Take 2 tablets (10mg ) on Tuesday Thursday Saturday & Sunday.) 140 tablet 0   No current facility-administered medications for this visit.     ROS:  See HPI  Physical Exam:    Incision:  well healed without hematoma or edema Extremities:  palpable radial pulse, grip 5/5 and equal B Neuro: sensation and motor intact Right internal jugular TDC in place    Assessment/Plan:  This is a 58 y.o. male who is s/p:right internal jugular TDC and Revision of left arm AV fistula with resection of ulcerated skin and plication of underlying aneurysmal brachiobasilic AV fistula  on 01/20/24 by Dr. Fulton Job.     -The fistula may be accessed and once it is working well he can have the [facility] removed.  F/U PRN with any concerns with his HD access.   Rocky Cipro PA-C [facility] [phone]   Clinic MD:  Fulton Job

## 2024-02-24 DIAGNOSIS — N186 End stage renal disease: Secondary | ICD-10-CM | POA: Diagnosis not present

## 2024-02-24 DIAGNOSIS — Z992 Dependence on renal dialysis: Secondary | ICD-10-CM | POA: Diagnosis not present

## 2024-02-24 DIAGNOSIS — D689 Coagulation defect, unspecified: Secondary | ICD-10-CM | POA: Diagnosis not present

## 2024-02-24 DIAGNOSIS — D631 Anemia in chronic kidney disease: Secondary | ICD-10-CM | POA: Diagnosis not present

## 2024-02-24 DIAGNOSIS — D509 Iron deficiency anemia, unspecified: Secondary | ICD-10-CM | POA: Diagnosis not present

## 2024-02-24 DIAGNOSIS — I129 Hypertensive chronic kidney disease with stage 1 through stage 4 chronic kidney disease, or unspecified chronic kidney disease: Secondary | ICD-10-CM | POA: Diagnosis not present

## 2024-02-24 DIAGNOSIS — E876 Hypokalemia: Secondary | ICD-10-CM | POA: Diagnosis not present

## 2024-02-24 DIAGNOSIS — G9009 Other idiopathic peripheral autonomic neuropathy: Secondary | ICD-10-CM | POA: Diagnosis not present

## 2024-02-24 DIAGNOSIS — N2581 Secondary hyperparathyroidism of renal origin: Secondary | ICD-10-CM | POA: Diagnosis not present

## 2024-02-24 DIAGNOSIS — T8249XD Other complication of vascular dialysis catheter, subsequent encounter: Secondary | ICD-10-CM | POA: Diagnosis not present

## 2024-02-26 ENCOUNTER — Other Ambulatory Visit: Payer: Self-pay | Admitting: Allergy & Immunology

## 2024-02-26 DIAGNOSIS — E876 Hypokalemia: Secondary | ICD-10-CM | POA: Diagnosis not present

## 2024-02-26 DIAGNOSIS — D689 Coagulation defect, unspecified: Secondary | ICD-10-CM | POA: Diagnosis not present

## 2024-02-26 DIAGNOSIS — D631 Anemia in chronic kidney disease: Secondary | ICD-10-CM | POA: Diagnosis not present

## 2024-02-26 DIAGNOSIS — N2581 Secondary hyperparathyroidism of renal origin: Secondary | ICD-10-CM | POA: Diagnosis not present

## 2024-02-26 DIAGNOSIS — T8249XD Other complication of vascular dialysis catheter, subsequent encounter: Secondary | ICD-10-CM | POA: Diagnosis not present

## 2024-02-26 DIAGNOSIS — N186 End stage renal disease: Secondary | ICD-10-CM | POA: Diagnosis not present

## 2024-02-26 DIAGNOSIS — D509 Iron deficiency anemia, unspecified: Secondary | ICD-10-CM | POA: Diagnosis not present

## 2024-02-26 DIAGNOSIS — Z992 Dependence on renal dialysis: Secondary | ICD-10-CM | POA: Diagnosis not present

## 2024-02-29 DIAGNOSIS — D509 Iron deficiency anemia, unspecified: Secondary | ICD-10-CM | POA: Diagnosis not present

## 2024-02-29 DIAGNOSIS — N2581 Secondary hyperparathyroidism of renal origin: Secondary | ICD-10-CM | POA: Diagnosis not present

## 2024-02-29 DIAGNOSIS — D689 Coagulation defect, unspecified: Secondary | ICD-10-CM | POA: Diagnosis not present

## 2024-02-29 DIAGNOSIS — T8249XD Other complication of vascular dialysis catheter, subsequent encounter: Secondary | ICD-10-CM | POA: Diagnosis not present

## 2024-02-29 DIAGNOSIS — Z992 Dependence on renal dialysis: Secondary | ICD-10-CM | POA: Diagnosis not present

## 2024-02-29 DIAGNOSIS — E876 Hypokalemia: Secondary | ICD-10-CM | POA: Diagnosis not present

## 2024-02-29 DIAGNOSIS — N186 End stage renal disease: Secondary | ICD-10-CM | POA: Diagnosis not present

## 2024-02-29 DIAGNOSIS — D631 Anemia in chronic kidney disease: Secondary | ICD-10-CM | POA: Diagnosis not present

## 2024-03-01 ENCOUNTER — Ambulatory Visit: Attending: Cardiovascular Disease

## 2024-03-01 DIAGNOSIS — D509 Iron deficiency anemia, unspecified: Secondary | ICD-10-CM | POA: Diagnosis not present

## 2024-03-01 DIAGNOSIS — I480 Paroxysmal atrial fibrillation: Secondary | ICD-10-CM

## 2024-03-01 DIAGNOSIS — Z7901 Long term (current) use of anticoagulants: Secondary | ICD-10-CM

## 2024-03-01 DIAGNOSIS — N2581 Secondary hyperparathyroidism of renal origin: Secondary | ICD-10-CM | POA: Diagnosis not present

## 2024-03-01 DIAGNOSIS — Z9889 Other specified postprocedural states: Secondary | ICD-10-CM | POA: Diagnosis not present

## 2024-03-01 DIAGNOSIS — N186 End stage renal disease: Secondary | ICD-10-CM | POA: Diagnosis not present

## 2024-03-01 DIAGNOSIS — T8249XD Other complication of vascular dialysis catheter, subsequent encounter: Secondary | ICD-10-CM | POA: Diagnosis not present

## 2024-03-01 DIAGNOSIS — D631 Anemia in chronic kidney disease: Secondary | ICD-10-CM | POA: Diagnosis not present

## 2024-03-01 DIAGNOSIS — D689 Coagulation defect, unspecified: Secondary | ICD-10-CM | POA: Diagnosis not present

## 2024-03-01 DIAGNOSIS — I4892 Unspecified atrial flutter: Secondary | ICD-10-CM

## 2024-03-01 DIAGNOSIS — Z992 Dependence on renal dialysis: Secondary | ICD-10-CM | POA: Diagnosis not present

## 2024-03-01 DIAGNOSIS — E876 Hypokalemia: Secondary | ICD-10-CM | POA: Diagnosis not present

## 2024-03-01 LAB — POCT INR: INR: 3 (ref 2.0–3.0)

## 2024-03-01 MED ORDER — WARFARIN SODIUM 5 MG PO TABS: ORAL_TABLET | ORAL | 0 refills | Status: DC

## 2024-03-01 NOTE — Patient Instructions (Signed)
 Continue taking 2 tablets daily except 1 tablet every Monday, Wednesday and Friday.   Recheck INR 6 weeks  Coumadin  Clinic [phone]

## 2024-03-02 ENCOUNTER — Observation Stay (HOSPITAL_COMMUNITY)
Admission: EM | Admit: 2024-03-02 | Discharge: 2024-03-03 | Disposition: A | Discharge status: Home / Self Care | Attending: Internal Medicine | Admitting: Internal Medicine

## 2024-03-02 ENCOUNTER — Encounter (HOSPITAL_COMMUNITY): Payer: Self-pay

## 2024-03-02 ENCOUNTER — Emergency Department (HOSPITAL_COMMUNITY)

## 2024-03-02 ENCOUNTER — Other Ambulatory Visit: Payer: Self-pay

## 2024-03-02 DIAGNOSIS — N186 End stage renal disease: Secondary | ICD-10-CM | POA: Insufficient documentation

## 2024-03-02 DIAGNOSIS — D631 Anemia in chronic kidney disease: Secondary | ICD-10-CM | POA: Diagnosis not present

## 2024-03-02 DIAGNOSIS — I132 Hypertensive heart and chronic kidney disease with heart failure and with stage 5 chronic kidney disease, or end stage renal disease: Secondary | ICD-10-CM | POA: Insufficient documentation

## 2024-03-02 DIAGNOSIS — J9601 Acute respiratory failure with hypoxia: Secondary | ICD-10-CM | POA: Diagnosis not present

## 2024-03-02 DIAGNOSIS — R7989 Other specified abnormal findings of blood chemistry: Secondary | ICD-10-CM | POA: Diagnosis not present

## 2024-03-02 DIAGNOSIS — R079 Chest pain, unspecified: Secondary | ICD-10-CM | POA: Diagnosis not present

## 2024-03-02 DIAGNOSIS — E876 Hypokalemia: Secondary | ICD-10-CM | POA: Diagnosis not present

## 2024-03-02 DIAGNOSIS — F10920 Alcohol use, unspecified with intoxication, uncomplicated: Secondary | ICD-10-CM | POA: Insufficient documentation

## 2024-03-02 DIAGNOSIS — I340 Nonrheumatic mitral (valve) insufficiency: Secondary | ICD-10-CM | POA: Insufficient documentation

## 2024-03-02 DIAGNOSIS — I214 Non-ST elevation (NSTEMI) myocardial infarction: Secondary | ICD-10-CM | POA: Diagnosis not present

## 2024-03-02 DIAGNOSIS — R0602 Shortness of breath: Secondary | ICD-10-CM | POA: Diagnosis not present

## 2024-03-02 DIAGNOSIS — E785 Hyperlipidemia, unspecified: Secondary | ICD-10-CM | POA: Diagnosis not present

## 2024-03-02 DIAGNOSIS — Z992 Dependence on renal dialysis: Secondary | ICD-10-CM | POA: Diagnosis not present

## 2024-03-02 DIAGNOSIS — Z7982 Long term (current) use of aspirin: Secondary | ICD-10-CM | POA: Diagnosis not present

## 2024-03-02 DIAGNOSIS — I25119 Atherosclerotic heart disease of native coronary artery with unspecified angina pectoris: Secondary | ICD-10-CM | POA: Insufficient documentation

## 2024-03-02 DIAGNOSIS — R000 Tachycardia, unspecified: Secondary | ICD-10-CM | POA: Diagnosis not present

## 2024-03-02 DIAGNOSIS — D509 Iron deficiency anemia, unspecified: Secondary | ICD-10-CM | POA: Diagnosis not present

## 2024-03-02 DIAGNOSIS — I209 Angina pectoris, unspecified: Principal | ICD-10-CM

## 2024-03-02 DIAGNOSIS — D689 Coagulation defect, unspecified: Secondary | ICD-10-CM | POA: Diagnosis not present

## 2024-03-02 DIAGNOSIS — I480 Paroxysmal atrial fibrillation: Secondary | ICD-10-CM | POA: Insufficient documentation

## 2024-03-02 DIAGNOSIS — I5042 Chronic combined systolic (congestive) and diastolic (congestive) heart failure: Secondary | ICD-10-CM | POA: Insufficient documentation

## 2024-03-02 DIAGNOSIS — N2581 Secondary hyperparathyroidism of renal origin: Secondary | ICD-10-CM | POA: Diagnosis not present

## 2024-03-02 DIAGNOSIS — T8249XD Other complication of vascular dialysis catheter, subsequent encounter: Secondary | ICD-10-CM | POA: Diagnosis not present

## 2024-03-02 LAB — BASIC METABOLIC PANEL WITH GFR
Anion gap: 18 — ABNORMAL HIGH (ref 5–15)
BUN: 23 mg/dL — ABNORMAL HIGH (ref 6–20)
CO2: 26 mmol/L (ref 22–32)
Calcium: 10.1 mg/dL (ref 8.9–10.3)
Chloride: 94 mmol/L — ABNORMAL LOW (ref 98–111)
Creatinine, Ser: 7.24 mg/dL — ABNORMAL HIGH (ref 0.61–1.24)
GFR, Estimated: 8 mL/min — ABNORMAL LOW (ref >60)
Glucose, Bld: 88 mg/dL (ref 70–99)
Potassium: 2.8 mmol/L — ABNORMAL LOW (ref 3.5–5.1)
Sodium: 138 mmol/L (ref 135–145)

## 2024-03-02 LAB — CBC
HCT: 34.7 % — ABNORMAL LOW (ref 39.0–52.0)
Hemoglobin: 12.2 g/dL — ABNORMAL LOW (ref 13.0–17.0)
MCH: 33.3 pg (ref 26.0–34.0)
MCHC: 35.2 g/dL (ref 30.0–36.0)
MCV: 94.8 fL (ref 80.0–100.0)
Platelets: 267 10*3/uL (ref 150–400)
RBC: 3.66 MIL/uL — ABNORMAL LOW (ref 4.22–5.81)
RDW: 13.9 % (ref 11.5–15.5)
WBC: 6.1 10*3/uL (ref 4.0–10.5)
nRBC: 0 % (ref 0.0–0.2)

## 2024-03-02 LAB — TROPONIN I (HIGH SENSITIVITY)
Troponin I (High Sensitivity): 66 ng/L — ABNORMAL HIGH (ref <18)
Troponin I (High Sensitivity): 63 ng/L — ABNORMAL HIGH (ref <18)
Troponin I (High Sensitivity): 68 ng/L — ABNORMAL HIGH (ref <18)
Troponin I (High Sensitivity): 68 ng/L — ABNORMAL HIGH (ref <18)

## 2024-03-02 LAB — PROTIME-INR
INR: 2.4 — ABNORMAL HIGH (ref 0.8–1.2)
Prothrombin Time: 26.1 s — ABNORMAL HIGH (ref 11.4–15.2)

## 2024-03-02 LAB — D-DIMER, QUANTITATIVE: D-Dimer, Quant: 0.39 ug{FEU}/mL (ref 0.00–0.50)

## 2024-03-02 MED ORDER — FUROSEMIDE 10 MG/ML IJ SOLN
40.0000 mg | Freq: Once | INTRAMUSCULAR | Status: DC
  Filled 2024-03-02: qty 4

## 2024-03-02 MED ORDER — LEVOTHYROXINE SODIUM 25 MCG PO TABS
25.0000 ug | ORAL_TABLET | Freq: Every day | ORAL | Status: DC
  Administered 2024-03-03: 25 ug via ORAL
  Filled 2024-03-02: qty 1

## 2024-03-02 MED ORDER — POTASSIUM CHLORIDE 20 MEQ PO PACK
40.0000 meq | PACK | Freq: Two times a day (BID) | ORAL | Status: AC
  Administered 2024-03-02 – 2024-03-03 (×2): 40 meq via ORAL
  Filled 2024-03-02 (×2): qty 2

## 2024-03-02 MED ORDER — FENTANYL CITRATE PF 50 MCG/ML IJ SOSY
50.0000 ug | PREFILLED_SYRINGE | Freq: Once | INTRAMUSCULAR | Status: AC
  Administered 2024-03-02: 50 ug via INTRAVENOUS
  Filled 2024-03-02: qty 1

## 2024-03-02 MED ORDER — CINACALCET HCL 30 MG PO TABS
90.0000 mg | ORAL_TABLET | Freq: Every day | ORAL | Status: DC
  Administered 2024-03-02 – 2024-03-03 (×2): 90 mg via ORAL
  Filled 2024-03-02 (×2): qty 3

## 2024-03-02 MED ORDER — WARFARIN - PHARMACIST DOSING INPATIENT: Freq: Every day | Status: DC

## 2024-03-02 MED ORDER — NITROGLYCERIN 0.4 MG SL SUBL
0.4000 mg | SUBLINGUAL_TABLET | Freq: Once | SUBLINGUAL | Status: AC
  Administered 2024-03-02: 0.4 mg via SUBLINGUAL
  Filled 2024-03-02: qty 1

## 2024-03-02 MED ORDER — BICTEGRAVIR-EMTRICITAB-TENOFOV 50-200-25 MG PO TABS
1.0000 | ORAL_TABLET | Freq: Every day | ORAL | Status: DC
  Administered 2024-03-02 – 2024-03-03 (×2): 1 via ORAL
  Filled 2024-03-02 (×2): qty 1

## 2024-03-02 MED ORDER — HYDROCODONE-ACETAMINOPHEN 5-325 MG PO TABS: 1.0000 | ORAL_TABLET | Freq: Four times a day (QID) | ORAL | Status: DC | PRN

## 2024-03-02 MED ORDER — NEPHRO-VITE 0.8 MG PO TABS
1.0000 | ORAL_TABLET | Freq: Every day | ORAL | Status: DC
  Administered 2024-03-02: 1 via ORAL
  Filled 2024-03-02 (×6): qty 1

## 2024-03-02 MED ORDER — SEVELAMER CARBONATE 800 MG PO TABS
2400.0000 mg | ORAL_TABLET | Freq: Every day | ORAL | Status: DC
  Administered 2024-03-02 – 2024-03-03 (×2): 2400 mg via ORAL
  Filled 2024-03-02 (×2): qty 3

## 2024-03-02 MED ORDER — FAMOTIDINE 20 MG PO TABS
20.0000 mg | ORAL_TABLET | Freq: Two times a day (BID) | ORAL | Status: DC
  Administered 2024-03-02 – 2024-03-03 (×2): 20 mg via ORAL
  Filled 2024-03-02 (×2): qty 1

## 2024-03-02 MED ORDER — ASPIRIN 81 MG PO CHEW
324.0000 mg | CHEWABLE_TABLET | Freq: Once | ORAL | Status: AC
  Administered 2024-03-02: 324 mg via ORAL
  Filled 2024-03-02: qty 4

## 2024-03-02 MED ORDER — FERRIC CITRATE 1 GM 210 MG(FE) PO TABS
420.0000 mg | ORAL_TABLET | Freq: Three times a day (TID) | ORAL | Status: DC
Start: 2024-03-02 — End: 2024-03-04
  Administered 2024-03-02 – 2024-03-03 (×4): 420 mg via ORAL
  Filled 2024-03-02 (×4): qty 2

## 2024-03-02 MED ORDER — AMLODIPINE BESYLATE 5 MG PO TABS
2.5000 mg | ORAL_TABLET | Freq: Every day | ORAL | Status: DC
  Administered 2024-03-02 – 2024-03-03 (×2): 2.5 mg via ORAL
  Filled 2024-03-02 (×2): qty 1

## 2024-03-02 MED ORDER — IPRATROPIUM-ALBUTEROL 0.5-2.5 (3) MG/3ML IN SOLN
3.0000 mL | Freq: Four times a day (QID) | RESPIRATORY_TRACT | Status: DC
Start: 2024-03-02 — End: 2024-03-03
  Administered 2024-03-02 – 2024-03-03 (×3): 3 mL via RESPIRATORY_TRACT
  Filled 2024-03-02 (×3): qty 3

## 2024-03-02 MED ORDER — DIALYVITE 800/ZINC 0.8 MG PO TABS: ORAL_TABLET | Freq: Every day | ORAL | Status: DC

## 2024-03-02 MED ORDER — METOPROLOL TARTRATE 25 MG PO TABS
25.0000 mg | ORAL_TABLET | Freq: Two times a day (BID) | ORAL | Status: DC
  Administered 2024-03-02 – 2024-03-03 (×2): 25 mg via ORAL
  Filled 2024-03-02 (×2): qty 1

## 2024-03-02 MED ORDER — ROSUVASTATIN CALCIUM 5 MG PO TABS
10.0000 mg | ORAL_TABLET | Freq: Every day | ORAL | Status: DC
  Administered 2024-03-02 – 2024-03-03 (×2): 10 mg via ORAL
  Filled 2024-03-02 (×2): qty 2

## 2024-03-02 MED ORDER — WARFARIN SODIUM 5 MG PO TABS
5.0000 mg | ORAL_TABLET | Freq: Once | ORAL | Status: AC
  Administered 2024-03-02: 5 mg via ORAL
  Filled 2024-03-02: qty 1

## 2024-03-02 MED ORDER — FLUTICASONE FUROATE-VILANTEROL 200-25 MCG/ACT IN AEPB
1.0000 | INHALATION_SPRAY | Freq: Every day | RESPIRATORY_TRACT | Status: DC
  Administered 2024-03-03: 1 via RESPIRATORY_TRACT
  Filled 2024-03-02: qty 28

## 2024-03-02 MED ORDER — EZETIMIBE 10 MG PO TABS
10.0000 mg | ORAL_TABLET | Freq: Every day | ORAL | Status: DC
Start: 2024-03-02 — End: 2024-03-04
  Administered 2024-03-02 – 2024-03-03 (×2): 10 mg via ORAL
  Filled 2024-03-02 (×2): qty 1

## 2024-03-02 NOTE — Consult Note (Addendum)
 Cardiology Consultation   Patient ID: Albert Eaton MRN: [id number]; DOB: 06/27/1966  Admit date: 03/02/2024 Date of Consult: 03/02/2024  PCP:  Albert Gaster, NP   [facility] Providers Cardiologist:  Albert Lapping, MD  Electrophysiologist:  Albert Byes, MD    Patient Profile:   Albert Eaton is a 58 y.o. male with a hx of CAD, HFmrEF, Paroxysmal atrial fibrillation, mitral regugitation s/p MV repair on 07/2022, Tricuspid regurgitation, Hx or PEA arrest in 08/2022, end stage renal disease s/p right internal jugular Tunnel dialysis catheter and left arm AV fistula resection, HTN, HLD, aortic atherosclerosis, OSA, HIV, Hep B, Renal CA s/p L nephrectomy, History of tobacco use who is being seen 03/02/2024 for the evaluation of Chest pain at the request of Albert Face MD.  History of Present Illness:   Albert Eaton is a 58 y.o. male who got a cardiac catheterization in 08/2022 following a cardiac arrest. The cath showed nonobstructive CAD with 50% proximal and mid LAD stenosis, 30% in second OM, and 40% stenosis in proximal RCA. The patient also wore a cardiac monitor following this cardiac arrest that showed normal sinus rhythm, an Afib burden less than 1 percent, and no other concerning arrhythmias. It was suspected that this cardiac arrest was secondary to electrolyte abnormalities. Has consistently had a  LVEF of about 45-50% on echo. Had a mitral valve repair in 07/2022. Most recent echo on 09/2023 at [facility] showed mildly dilated LV size with normal wall thickness. LVEF was visually estimated at 55%, Mitral valve repair with annuloplasty ring present, There is mild mitral valve regurgitation. The aorta at the ascending aorta was mildly dilated. Was previously on amiodarone  but this was discontinued At his most recent visit with Albert Eaton on 10/2023 he was transitioned from Eliquis  to Warfarin because [facility] want that for his kidney transplant that he is waiting on. He  is followed by the coumadin  clinic.  The patient presented to the ER on 03/02/24 with orthopnea. He continued with dialysis and developed a central chest heaviness. After dialysis the shortness of breath improved but did not resolve. Received aspirin  324mg  and sublingual nitroglycerin .  Reported having shortness of breath last night that woke him up out of his sleep. Also has orthopnea, dyspnea on exertion, orthopnea, restlessness. Went to dialysis today and developed substernal chest pain /chest pressure during dialysis. He completed dialysis. Had some nausea when the chest pressure started but that has gone away. Denies vomiting, diaphoresis, lower extremity edema, pleuritic pain, worsening chest pain with exertion or movement. The pain does not radiate. The sublingual nitroglycerin  did not help his pain. Continues to have 6/10 chest pain. Had GERD last night that was better with famotidine  but this pain feels different to that pain.  Labs in the ER showed Potassium2.8* (05/07 0947)   Creatinine, ser  7.24* (05/07 0947) PLT  267 (05/07 0947) HGB  12.2* (05/07 0947) WBC 6.1 (05/07 0947) Troponin I (High Sensitivity)66* (05/07 0947)  EKG showed normal sinus rhythm with a rate of 80 with a prolonged QTCB of 548.  Chest x-ray shows clear lung fields, no pleural effusion, and no active disease.  Past Medical History:  Diagnosis Date   Anemia    Aortic atherosclerosis (HCC)    Arthritis    Ascending aorta dilation (HCC) 02/17/2023   Limited TTE 02/17/23: no effusion, s/p MV repair w trivial residual MR, asc aorta 39 mm, mod TR, severe LAE   Asthma  [facility])    renal cyst   Chronic diastolic CHF (congestive heart failure) (HCC) 01/06/2017   Echo 7/16 [facility] in [place], [place]) Mild AI, mild LAE, mild concentric LVH, EF 55, normal wall motion, mild to moderate MR, mild PI, RVSP 55 // Echo 10/09/16 ([facility]):  Moderate LVH, grade 2 diastolic dysfunction, mild MR, moderate LAE     Dysrhythmia    paroxysmal afib/flutter   Esophagitis    ESRD (end stage renal disease) on dialysis [facility])    Dialysis Mon Wed Fri   GERD (gastroesophageal reflux disease)    Gout    no current problems   Heart murmur    "had it since he was a baby"- pt had echo 10/01/23   Hepatitis    Hep B   HIV (human immunodeficiency virus infection) (HCC)    HLD (hyperlipidemia)    Hypertension    Hypothyroidism    Mitral regurgitation    Myocardial infarction (HCC)    Neuropathy    Sleep apnea    does not use c-pap   Stroke [facility])    Wears glasses    Wears partial dentures     Past Surgical History:  Procedure Laterality Date   AV FISTULA PLACEMENT Left 07/02/2018   Procedure: Creation of Left arm BRACHIOBASILIC ARTERIOVENOUS  FISTULA;  Surgeon: Young Hensen, MD;  Location: [facility] OR;  Service: Vascular;  Laterality: Left;   BASCILIC VEIN TRANSPOSITION Left 08/27/2018   Procedure: Left arm BRACHIOBASILIC VEIN TRANSPOSITION SECOND STAGE;  Surgeon: Young Hensen, MD;  Location: [facility] OR;  Service: Vascular;  Laterality: Left;   BUBBLE STUDY  09/03/2020   Procedure: BUBBLE STUDY;  Surgeon: Elmyra Haggard, MD;  Location: [facility] ENDOSCOPY;  Service: Cardiovascular;;   BUBBLE STUDY  06/03/2022   Procedure: BUBBLE STUDY;  Surgeon: Euell Herrlich, MD;  Location: [facility] ENDOSCOPY;  Service: Cardiology;;   CAPD INSERTION N/A 05/30/2021   Procedure: LAPAROSCOPIC INSERTION CONTINUOUS AMBULATORY PERITONEAL DIALYSIS  (CAPD) CATHETER;  Surgeon: Carlene Che, MD;  Location: [facility] OR;  Service: Vascular;  Laterality: N/A;   CAPD REMOVAL N/A 02/13/2022   Procedure: PERITONEAL DIALYSIS CATHETER REMOVAL;  Surgeon: Carlene Che, MD;  Location: [facility] OR;  Service: Vascular;  Laterality: N/A;   CHOLECYSTECTOMY     COLONOSCOPY W/ BIOPSIES AND POLYPECTOMY     DRUG INDUCED ENDOSCOPY Bilateral 07/06/2023   Procedure: DRUG INDUCED SLEEP ENDOSCOPY;  Surgeon: Virgina Grills, MD;  Location: [facility] OR;  Service: ENT;   Laterality: Bilateral;   GIVENS CAPSULE STUDY N/A 03/01/2018   Procedure: GIVENS CAPSULE STUDY;  Surgeon: Nannette Babe, MD;  Location: [facility] ENDOSCOPY;  Service: Gastroenterology;  Laterality: N/A;   HERNIA REPAIR     As baby   IMPLANTATION OF HYPOGLOSSAL NERVE STIMULATOR Right 01/14/2024   Procedure: RIGHT IMPLANTATION OF HYPOGLOSSAL NERVE STIMULATOR;  Surgeon: Virgina Grills, MD;  Location: [facility] OR;  Service: ENT;  Laterality: Right;   INSERTION OF DIALYSIS CATHETER Right 01/20/2024   Procedure: INSERTION OF DIALYSIS CATHETER USING 19cm PALINDROME CHRONIC CATHETER INTO THE RIGHT INTERNAL JUGULAR;  Surgeon: Young Hensen, MD;  Location: [facility] OR;  Service: Vascular;  Laterality: Right;   LEFT HEART CATH AND CORONARY ANGIOGRAPHY N/A 09/11/2022   Procedure: LEFT HEART CATH AND CORONARY ANGIOGRAPHY;  Surgeon: Lucendia Rusk, MD;  Location: [facility] INVASIVE CV LAB;  Service: Cardiovascular;  Laterality: N/A;   MITRAL VALVE REPAIR N/A 08/21/2022   Procedure: MINIMALLY INVASIVE MITRAL VALVE REPAIR USING RING ANNULOPLASTY SIMULUS 30mm;  Surgeon: Melene Sportsman, MD;  Location: [facility] OR;  Service: Open Heart Surgery;  Laterality: N/A;   MULTIPLE TOOTH EXTRACTIONS     NEPHRECTOMY Left 02/18/2021   NOSE SURGERY     RENAL BIOPSY     REVISON OF ARTERIOVENOUS FISTULA Left 01/20/2024   Procedure: REVISON OF ARTERIOVENOUS FISTULA WITH ARTERIAL PLICATION;  Surgeon: Young Hensen, MD;  Location: [facility] OR;  Service: Vascular;  Laterality: Left;   RIGHT/LEFT HEART CATH AND CORONARY ANGIOGRAPHY N/A 09/04/2021   Procedure: RIGHT/LEFT HEART CATH AND CORONARY ANGIOGRAPHY;  Surgeon: Odie Benne, MD;  Location: [facility] INVASIVE CV LAB;  Service: Cardiovascular;  Laterality: N/A;   RIGHT/LEFT HEART CATH AND CORONARY ANGIOGRAPHY N/A 08/01/2022   Procedure: RIGHT/LEFT HEART CATH AND CORONARY ANGIOGRAPHY;  Surgeon: Albert Lapping, MD;  Location: [facility] INVASIVE CV LAB;  Service: Cardiovascular;  Laterality: N/A;   TEE  WITHOUT CARDIOVERSION N/A 09/03/2020   Procedure: TRANSESOPHAGEAL ECHOCARDIOGRAM (TEE);  Surgeon: Elmyra Haggard, MD;  Location: [facility] ENDOSCOPY;  Service: Cardiovascular;  Laterality: N/A;   TEE WITHOUT CARDIOVERSION N/A 06/03/2022   Procedure: TRANSESOPHAGEAL ECHOCARDIOGRAM (TEE);  Surgeon: Euell Herrlich, MD;  Location: [facility] ENDOSCOPY;  Service: Cardiology;  Laterality: N/A;   TEE WITHOUT CARDIOVERSION N/A 08/21/2022   Procedure: TRANSESOPHAGEAL ECHOCARDIOGRAM (TEE);  Surgeon: Melene Sportsman, MD;  Location: [facility] OR;  Service: Open Heart Surgery;  Laterality: N/A;   UPPER GI ENDOSCOPY  04/05/2021     Home Medications:  Prior to Admission medications   Medication Sig Start Date End Date Taking? Authorizing Provider  allopurinol  (ZYLOPRIM ) 100 MG tablet Take 1 tablet (100 mg total) by mouth daily. 10/23/23   Albert Gaster, NP  amLODipine  (NORVASC ) 2.5 MG tablet Take 1 tablet (2.5 mg total) by mouth daily. 10/19/23   Albert Lapping, MD  azelastine  (ASTELIN ) 0.1 % nasal spray Place 2 sprays into both nostrils 2 (two) times daily as needed for allergies. 04/01/23   Rochester Chuck, MD  B Complex-C-Zn-Folic Acid  (DIALYVITE  800/ZINC  PO) Take 1 tablet by mouth daily. 12/14/23   [provider]  bictegravir-emtricitabine -tenofovir  AF (BIKTARVY ) 50-200-25 MG TABS tablet Take 1 tablet by mouth daily. 07/28/23   Lina Render, MD  cetirizine  (ZYRTEC ) 10 MG tablet TAKE ONE TABLET BY MOUTH DAILY AT 9 a.m. 12/18/23   Rochester Chuck, MD  cinacalcet  (SENSIPAR ) 90 MG tablet Take 90 mg by mouth daily. 10/08/23   [provider]  diclofenac  Sodium (VOLTAREN ) 1 % GEL Apply 2 g topically 4 (four) times daily. Apply to chest pain when lidocaine  patch not in place Patient taking differently: Apply 2 g topically 4 (four) times daily as needed (pain). 09/14/22   Wynetta Heckle, MD  famotidine  (PEPCID ) 20 MG tablet Take 1 tablet (20 mg total) by mouth 2 (two) times daily. 11/13/22   Rochester Chuck, MD  ferric citrate  (AURYXIA ) 1 GM 210 MG(Fe) tablet Take 420 mg by mouth See admin instructions. Take 420 mg by mouth three times a day with meals and 210 mg with each snack    [provider]  fluticasone  (FLOVENT  HFA) 110 MCG/ACT inhaler Inhale 2 puffs into the lungs 2 (two) times daily as needed ("for flares").    [provider]  gabapentin  (NEURONTIN ) 300 MG capsule Take 1 capsule (300 mg total) by mouth daily as needed (pain/neuropathy). 09/14/22   Wynetta Heckle, MD  HYDROcodone -acetaminophen  (NORCO/VICODIN) 5-325 MG tablet Take 1 tablet by mouth every 6 (six) hours as needed. 01/20/24 01/19/25  Rhyne, Samantha J, PA-C  ipratropium (ATROVENT ) 0.06 % nasal spray USE TWO SPRAYS IN EACH NOSTRIL 2-3 times daily AS NEEDED 11/10/23   Rochester Chuck, MD  iron  sucrose (VENOFER ) 20 MG/ML injection Inject into the vein 3 (three) times a week. 11/27/23 11/25/24  [provider]  lactulose  (CHRONULAC ) 10 GM/15ML solution Take 45 mLs (30 g total) by mouth daily as needed. Take 15-30 ml up to twice daily for constipation Patient taking differently: Take 40 g by mouth daily as needed. 01/08/23   Pyrtle, Amber Bail, MD  levalbuterol  (XOPENEX  HFA) 45 MCG/ACT inhaler Inhale 2 puffs into the lungs every 4 (four) hours as needed for wheezing. 01/08/24   Rochester Chuck, MD  levothyroxine  (SYNTHROID ) 25 MCG tablet Take 1 tablet (25 mcg total) by mouth daily before breakfast. 10/27/23   Albert Gaster, NP  lubiprostone  (AMITIZA ) 24 MCG capsule Take 1 capsule (24 mcg total) by mouth 2 (two) times daily with a meal. NEEDS APPT FOR FURTHER REFILLS 10/19/23   Pyrtle, Amber Bail, MD  Methoxy PEG-Epoetin  Beta (MIRCERA IJ) every 30 (thirty) days. 11/27/23 11/25/24  [provider]  metoprolol  tartrate (LOPRESSOR ) 25 MG tablet Take 1 tablet (25 mg total) by mouth 2 (two) times daily. 11/04/23   Cooper, Michael, MD  Olopatadine  HCl (PATADAY ) 0.7 % SOLN Place 1 drop into both eyes 2 (two)  times daily as needed (allergies). 11/13/22   Rochester Chuck, MD  pantoprazole  (PROTONIX ) 40 MG tablet Take 1 tablet (40 mg total) by mouth daily. 06/01/23   Rochester Chuck, MD  rOPINIRole  (REQUIP ) 1 MG tablet Take 1 tablet (1 mg total) by mouth at bedtime. 10/26/23   Olalere, Rickie Chars, MD  rosuvastatin  (CRESTOR ) 10 MG tablet Take 1 tablet (10 mg total) by mouth daily. 10/19/23   Albert Lapping, MD  sevelamer  carbonate (RENVELA ) 800 MG tablet Take 2,400 mg by mouth daily. 04/22/23   [provider]  Spacer/Aero-Holding Idelle Majors DEVI 1 Device by Does not apply route 2 (two) times a day. 03/01/19   Bobbitt, Colen Daunt, MD  SYMBICORT  160-4.5 MCG/ACT inhaler Inhale 2 puffs into the lungs in the morning and at bedtime. Patient taking differently: Inhale 2 puffs into the lungs 2 (two) times daily as needed (Asthma). 07/01/23   Rochester Chuck, MD  tiZANidine  (ZANAFLEX ) 2 MG tablet Take 1 tablet (2 mg total) by mouth 2 (two) times daily as needed for muscle spasms. 07/07/23   Albert Gaster, NP  Vitamin D , Ergocalciferol , (DRISDOL ) 1.25 MG (50000 UNIT) CAPS capsule Take 1 capsule (50,000 Units total) by mouth every Tuesday. 01/06/23   Albert Gaster, NP  warfarin (COUMADIN ) 5 MG tablet Take 1 tablet to 2 tablets by mouth daily as prescribed by coumadin  clinic 03/01/24   Albert Lapping, MD    Inpatient Medications: Scheduled Meds:  aspirin   324 mg Oral Once   Continuous Infusions:  PRN Meds:   Allergies:    Allergies  Allergen Reactions   Ace Inhibitors Cough and Other (See Comments)    Social History:   Social History   Socioeconomic History   Marital status: Single    Spouse name: Not on file   Number of children: 0   Years of education: Not on file   Highest education level: Not on file  Occupational History   Not on file  Tobacco Use   Smoking status: Former    Current packs/day: 0.00    Average packs/day: 1 pack/day for 18.0  years (18.0 ttl pk-yrs)     Types: Cigarettes    Start date: 67    Quit date: 2000    Years since quitting: 25.3   Smokeless tobacco: Never  Vaping Use   Vaping status: Never Used  Substance and Sexual Activity   Alcohol use: Yes    Comment: just special occasions/holidays   Drug use: No   Sexual activity: Not Currently    Partners: Male  Other Topics Concern   Not on file  Social History Narrative   Disability      [place]: none   Social Drivers of Health   Financial Resource Strain: High Risk (07/13/2023)   Overall Financial Resource Strain (CARDIA)    Difficulty of Paying Living Expenses: Hard  Food Insecurity: Food Insecurity Present (07/13/2023)   Hunger Vital Sign    Worried About Running Out of Food in the Last Year: Often true    Ran Out of Food in the Last Year: Never true  Transportation Needs: No Transportation Needs (07/13/2023)   PRAPARE - Administrator, Civil Service (Medical): No    Lack of Transportation (Non-Medical): No  Physical Activity: Inactive (07/13/2023)   Exercise Vital Sign    Days of Exercise per Week: 0 days    Minutes of Exercise per Session: 0 min  Stress: No Stress Concern Present (07/13/2023)   [employer] of Occupational Health - Occupational Stress Questionnaire    Feeling of Stress : Not at all  Social Connections: Socially Isolated (07/13/2023)   Social Connection and Isolation Panel [NHANES]    Frequency of Communication with Friends and Family: More than three times a week    Frequency of Social Gatherings with Friends and Family: More than three times a week    Attends Religious Services: Never    Database administrator or Organizations: No    Attends Banker Meetings: Never    Marital Status: Never married  Intimate Partner Violence: Not At Risk (07/13/2023)   Humiliation, Afraid, Rape, and Kick questionnaire    Fear of Current or Ex-Partner: No    Emotionally Abused: No    Physically Abused: No    Sexually Abused: No     Family History:    Family History  Problem Relation Age of Onset   Hypertension Mother    Heart failure Mother    Hypertension Father    Alcohol abuse Father    Hypertension Sister    Multiple sclerosis Sister    Hypertension Sister    Hypertension Brother    Heart attack Maternal Grandmother 41   Scoliosis Other    Allergic rhinitis Neg Hx    Angioedema Neg Hx    Asthma Neg Hx    Eczema Neg Hx    Immunodeficiency Neg Hx    Urticaria Neg Hx    Colon cancer Neg Hx    Pancreatic cancer Neg Hx    Esophageal cancer Neg Hx      ROS:  Please see the history of present illness.   All other ROS reviewed and negative.     Physical Exam/Data:   Vitals:   03/02/24 0908 03/02/24 0911 03/02/24 0945 03/02/24 1000  BP:   127/89 117/88  Pulse:   76 78  Resp:   (!) 24 16  Temp:  97.7 F (36.5 C)    TempSrc:  Oral    SpO2:   100% 100%  Weight: 90.3 kg     Height: 5\' 10"  (1.778  m)      No intake or output data in the 24 hours ending 03/02/24 1138    03/02/2024    9:08 AM 02/23/2024    8:57 AM 01/20/2024    5:48 AM  Last 3 Weights  Weight (lbs) 199 lb 1.2 oz 202 lb 4.8 oz 200 lb  Weight (kg) 90.3 kg 91.763 kg 90.719 kg     Body mass index is 28.56 kg/m.  General:  Well nourished, well developed, SPO2 100% on room air. HEENT: normal Neck: no JVD Vascular: No carotid bruits; Distal pulses 2+ on right. Has fistula on left arm. Cardiac:  normal S1, S2; RRR;  2/6 systolic murmur under left breast, radiating to the L ax line .   Lungs:  clear to auscultation bilaterally, no wheezing, rhonchi or rales  Abd: soft, nontender, no hepatomegaly  Ext: no edema Musculoskeletal: dialysis fistula in left upper arm .   Skin: warm and dry  Neuro:  no focal abnormalities noted Psych:  Normal affect   EKG:  The EKG was personally reviewed and demonstrates:  sinus rhythm with a rate of 80 with a prolonged QTCB of 548 Telemetry:  Telemetry was personally reviewed and demonstrates:  Sinus  rhythm in the 70's frequent PAC's and PVC's.   Relevant CV Studies:   Laboratory Data:  High Sensitivity Troponin:   Recent Labs  Lab 03/02/24 0947  TROPONINIHS 66*     Chemistry Recent Labs  Lab 03/02/24 0947  NA 138  K 2.8*  CL 94*  CO2 26  GLUCOSE 88  BUN 23*  CREATININE 7.24*  CALCIUM  10.1  GFRNONAA 8*  ANIONGAP 18*    No results for input(s): "PROT", "ALBUMIN ", "AST", "ALT", "ALKPHOS", "BILITOT" in the last 168 hours. Lipids No results for input(s): "CHOL", "TRIG", "HDL", "LABVLDL", "LDLCALC", "CHOLHDL" in the last 168 hours.  Hematology Recent Labs  Lab 03/02/24 0947  WBC 6.1  RBC 3.66*  HGB 12.2*  HCT 34.7*  MCV 94.8  MCH 33.3  MCHC 35.2  RDW 13.9  PLT 267   Thyroid  No results for input(s): "TSH", "FREET4" in the last 168 hours.  BNPNo results for input(s): "BNP", "PROBNP" in the last 168 hours.  DDimer No results for input(s): "DDIMER" in the last 168 hours.   Radiology/Studies:  DG Chest Port 1 View Result Date: 03/02/2024 CLINICAL DATA:  Chest pain. EXAM: PORTABLE CHEST 1 VIEW COMPARISON:  01/20/2024. FINDINGS: Bilateral lung fields are clear. Bilateral costophrenic angles are clear. Stable cardio-mediastinal silhouette. Mitral annuloplasty noted. No acute osseous abnormalities. The soft tissues are within normal limits. Right IJ hemodialysis catheter again seen with its tip overlying the cavoatrial junction region. Neurostimulator device battery pack noted overlying the right lateral middle chest wall, with lead extending superiorly towards the right neck. IMPRESSION: No active disease. Electronically Signed   By: Beula Brunswick M.D.   On: 03/02/2024 10:11     Assessment and Plan:   Albert Eaton is a 58 y.o. male with a hx of CAD, HFmrEF, Paroxysmal atrial fibrillation, mitral regugitation s/p MV repair on 07/2022, Tricuspid regurgitation, Hx or PEA arrest in 08/2022, end stage renal disease s/p right internal jugular Tunnel dialysis catheter and  left arm AV fistula resection, HTN, HLD, aortic atherosclerosis, OSA, HIV, Hep B, Renal CA s/p L nephrectomy who is being seen 03/02/2024 for the evaluation of Chest pain at the request of Albert Face MD.  Chest pain Shortness of breath Elevated troponin's Nonobstructive CAD History of cardiac arrest in  08/2022 Has shortness of breath that started last night and chest tightness that started at dialysis. Is continuing to have chest discomfort and shortness of breath. Shortness of breath is worse with exertion. Has had Tonna Frederic but this feels different to that.  -- LHC on 08/2022 showed nonobstructive CAD with 50% proximal and mid LAD stenosis, 30% in second OM, and 40% stenosis in proximal RCA. -- high sensitivity troponin 66 > 63 (elevated and flat). EKG showed normal sinus rhythm with a rate of 80 with a prolonged QTCB of 548. Is normotensive. Chest x-ray showed no pulmonary infiltrates or pleural effusion. -- elevated and flat troponins are likely secondary to demand ischemia and ESRD as they are similar to prior troponins. -- Continue to trend troponins. -- Warfarin was recently subtherapeutic on 12/2023. Consider a VQ scan for a pulmonary embolism. -- Order echo.   ESRD on dialysis Hypokalemia Hypochloremia -- Initial potassium is 2.8. Initial Cl 94 -- Management per nephrology   Paroxysmal atrial fibrillation -- In NSR on telemetry with frequent ectopy (PAC's and PVC's). -- continue warfarin. -- continue lopressor  25mg  daily   Mitral regugitation s/p MV repair on 07/2022 Tricuspid regurgitation -- Order Echo   HTN HFmrEF Is currently normotensive.3 Chest x-ray showed no pulmonary infiltrates or pleural effusion. Is euvolemic on exam. Most recent echo showed LVEF of 55%  -- Echo pending Continue home amlodipine , and lopressor  25mg  daily  Hyperlipidemia -- continue Crestor  10mg  daily  Other conditions managed per primary  Risk Assessment/Risk Scores:   :[id number]}             For questions or updates, please contact [facility] Please consult [email or web address] for contact info under    Signed, Melita Springer, PA-C  03/02/2024 11:38 AM   Attending Note:   The patient was seen and examined.  Agree with assessment and plan as noted above.  Changes made to the above note as needed.  Patient seen and independently examined with Zane Adams, PA .   We discussed all aspects of the encounter. I agree with the assessment and plan as stated above.    Chest pain :   His chest pain is fairly atypical to be coronary artery disease.  He has had 2 unremarkable heart catheterizations, the last heart cath was in 2023. Troponins are minimally elevated but with a flat trend and are not consistent with an acute coronary syndrome.  His chest pain is not pleuritic.  He also has on warfarin.  His INR is therapeutic now although he did have some time where it was not therapeutic approximately 6 weeks ago.  Will continue to draw troponin levels to rule out acute coronary syndrome.  Will also get an echocardiogram to further evaluate his LV function and valvular function.  His last echocardiogram was performed October 01, 2023 in the [facility] health system.  His LVEF was 55% at that time.  He has mild residual mitral regurgitation, mild tricuspid regurgitation   If we are unable to find a clear-cut explanation for his chest pain 1 could consider doing a VQ scan to look for chronic pulmonary emboli.  2.  Shortness of breath:     I have spent a total of 40 minutes with patient reviewing hospital  notes , telemetry, EKGs, labs and examining patient as well as establishing an assessment and plan that was discussed with the patient.  > 50% of time was spent in direct patient care.    [place], Marieta Shorten., MD, [facility]  03/02/2024, 2:23 PM [address],  [address] Office [phone] Pager [phone]

## 2024-03-02 NOTE — ED Triage Notes (Signed)
 Pt here from dialysis. Pt completed 3 hours of dialysis and started having dull achy chest pain. Hx of cardiac arrest and MI. BP 108 systolic. EMS gave 324mg  of aspirin . Axox4. C/O SHOB.

## 2024-03-02 NOTE — H&P (Incomplete Revision)
 History and Physical    Patient: Albert Eaton BJY:[id number] DOB: 1966/07/05 DOA: 03/02/2024 DOS: the patient was seen and examined on 03/02/2024 PCP: Dorothe Gaster, NP  Patient coming from: Home  Chief Complaint:  Chief Complaint  Patient presents with   Chest Pain   Shortness of Breath   HPI: Albert Eaton is a 58 y.o. male with medical history significant of CAD, HFmrEF, paroxysmal atrial fibrillation, mitral regugitation s/p MV repair on 07/2022, tricuspid regurgitation, s/p PEA arrest in 08/2022, ESRD s/p R internal jugular tunnled dialysis catheter and left arm AV fistula resection, HTN, HLD, aortic atherosclerosis, OSA, HIV, Hep B, Renal CA s/p L nephrectomy,and tobacco use p/w cp and NSTEMI.  Pt is a poor historian. He is actively endorsing SOB and deferring a full exam at this time. From what I can gather per pt, he awoke this morning, and was so acutely SOB that he activated EMS; of note, pt reported to cardiology that his chest discomfort/SOB began with dialysis. Pt endorses SOB at rest that worsens with activity, which is not normal for him.   In the ED, pt was evaluated by cardiology and admitted to medicine for ongoing w/u.  Review of Systems: As mentioned in the history of present illness. All other systems reviewed and are negative. Past Medical History:  Diagnosis Date   Anemia    Aortic atherosclerosis (HCC)    Arthritis    Ascending aorta dilation (HCC) 02/17/2023   Limited TTE 02/17/23: no effusion, s/p MV repair w trivial residual MR, asc aorta 39 mm, mod TR, severe LAE   Asthma    Cancer (HCC)    renal cyst   Chronic diastolic CHF (congestive heart failure) (HCC) 01/06/2017   Echo 7/16 [facility] in [place], [place]) Mild AI, mild LAE, mild concentric LVH, EF 55, normal wall motion, mild to moderate MR, mild PI, RVSP 55 // Echo 10/09/16 ([facility]):  Moderate LVH, grade 2 diastolic dysfunction, mild MR, moderate LAE    Dysrhythmia    paroxysmal afib/flutter    Esophagitis    ESRD (end stage renal disease) on dialysis [facility])    Dialysis Mon Wed Fri   GERD (gastroesophageal reflux disease)    Gout    no current problems   Heart murmur    "had it since he was a baby"- pt had echo 10/01/23   Hepatitis    Hep B   HIV (human immunodeficiency virus infection) (HCC)    HLD (hyperlipidemia)    Hypertension    Hypothyroidism    Mitral regurgitation    Myocardial infarction (HCC)    Neuropathy    Sleep apnea    does not use c-pap   Stroke [facility])    Wears glasses    Wears partial dentures    Past Surgical History:  Procedure Laterality Date   AV FISTULA PLACEMENT Left 07/02/2018   Procedure: Creation of Left arm BRACHIOBASILIC ARTERIOVENOUS  FISTULA;  Surgeon: Young Hensen, MD;  Location: [facility] OR;  Service: Vascular;  Laterality: Left;   BASCILIC VEIN TRANSPOSITION Left 08/27/2018   Procedure: Left arm BRACHIOBASILIC VEIN TRANSPOSITION SECOND STAGE;  Surgeon: Young Hensen, MD;  Location: [facility] OR;  Service: Vascular;  Laterality: Left;   BUBBLE STUDY  09/03/2020   Procedure: BUBBLE STUDY;  Surgeon: Elmyra Haggard, MD;  Location: [facility] ENDOSCOPY;  Service: Cardiovascular;;   BUBBLE STUDY  06/03/2022   Procedure: BUBBLE STUDY;  Surgeon: Euell Herrlich, MD;  Location: [facility] ENDOSCOPY;  Service:  Cardiology;;   CAPD INSERTION N/A 05/30/2021   Procedure: LAPAROSCOPIC INSERTION CONTINUOUS AMBULATORY PERITONEAL DIALYSIS  (CAPD) CATHETER;  Surgeon: Carlene Che, MD;  Location: [facility] OR;  Service: Vascular;  Laterality: N/A;   CAPD REMOVAL N/A 02/13/2022   Procedure: PERITONEAL DIALYSIS CATHETER REMOVAL;  Surgeon: Carlene Che, MD;  Location: [facility] OR;  Service: Vascular;  Laterality: N/A;   CHOLECYSTECTOMY     COLONOSCOPY W/ BIOPSIES AND POLYPECTOMY     DRUG INDUCED ENDOSCOPY Bilateral 07/06/2023   Procedure: DRUG INDUCED SLEEP ENDOSCOPY;  Surgeon: Virgina Grills, MD;  Location: [facility] OR;  Service: ENT;  Laterality: Bilateral;   GIVENS CAPSULE STUDY N/A  03/01/2018   Procedure: GIVENS CAPSULE STUDY;  Surgeon: Nannette Babe, MD;  Location: [facility] ENDOSCOPY;  Service: Gastroenterology;  Laterality: N/A;   HERNIA REPAIR     As baby   IMPLANTATION OF HYPOGLOSSAL NERVE STIMULATOR Right 01/14/2024   Procedure: RIGHT IMPLANTATION OF HYPOGLOSSAL NERVE STIMULATOR;  Surgeon: Virgina Grills, MD;  Location: [facility] OR;  Service: ENT;  Laterality: Right;   INSERTION OF DIALYSIS CATHETER Right 01/20/2024   Procedure: INSERTION OF DIALYSIS CATHETER USING 19cm PALINDROME CHRONIC CATHETER INTO THE RIGHT INTERNAL JUGULAR;  Surgeon: Young Hensen, MD;  Location: [facility] OR;  Service: Vascular;  Laterality: Right;   LEFT HEART CATH AND CORONARY ANGIOGRAPHY N/A 09/11/2022   Procedure: LEFT HEART CATH AND CORONARY ANGIOGRAPHY;  Surgeon: Lucendia Rusk, MD;  Location: [facility] INVASIVE CV LAB;  Service: Cardiovascular;  Laterality: N/A;   MITRAL VALVE REPAIR N/A 08/21/2022   Procedure: MINIMALLY INVASIVE MITRAL VALVE REPAIR USING RING ANNULOPLASTY SIMULUS 30mm;  Surgeon: Melene Sportsman, MD;  Location: [facility] OR;  Service: Open Heart Surgery;  Laterality: N/A;   MULTIPLE TOOTH EXTRACTIONS     NEPHRECTOMY Left 02/18/2021   NOSE SURGERY     RENAL BIOPSY     REVISON OF ARTERIOVENOUS FISTULA Left 01/20/2024   Procedure: REVISON OF ARTERIOVENOUS FISTULA WITH ARTERIAL PLICATION;  Surgeon: Young Hensen, MD;  Location: [facility] OR;  Service: Vascular;  Laterality: Left;   RIGHT/LEFT HEART CATH AND CORONARY ANGIOGRAPHY N/A 09/04/2021   Procedure: RIGHT/LEFT HEART CATH AND CORONARY ANGIOGRAPHY;  Surgeon: Odie Benne, MD;  Location: [facility] INVASIVE CV LAB;  Service: Cardiovascular;  Laterality: N/A;   RIGHT/LEFT HEART CATH AND CORONARY ANGIOGRAPHY N/A 08/01/2022   Procedure: RIGHT/LEFT HEART CATH AND CORONARY ANGIOGRAPHY;  Surgeon: Arnoldo Lapping, MD;  Location: [facility] INVASIVE CV LAB;  Service: Cardiovascular;  Laterality: N/A;   TEE WITHOUT CARDIOVERSION N/A 09/03/2020   Procedure:  TRANSESOPHAGEAL ECHOCARDIOGRAM (TEE);  Surgeon: Elmyra Haggard, MD;  Location: [facility] ENDOSCOPY;  Service: Cardiovascular;  Laterality: N/A;   TEE WITHOUT CARDIOVERSION N/A 06/03/2022   Procedure: TRANSESOPHAGEAL ECHOCARDIOGRAM (TEE);  Surgeon: Euell Herrlich, MD;  Location: [facility] ENDOSCOPY;  Service: Cardiology;  Laterality: N/A;   TEE WITHOUT CARDIOVERSION N/A 08/21/2022   Procedure: TRANSESOPHAGEAL ECHOCARDIOGRAM (TEE);  Surgeon: Melene Sportsman, MD;  Location: [facility] OR;  Service: Open Heart Surgery;  Laterality: N/A;   UPPER GI ENDOSCOPY  04/05/2021   Social History:  reports that he quit smoking about 25 years ago. His smoking use included cigarettes. He started smoking about 43 years ago. He has a 18 pack-year smoking history. He has never used smokeless tobacco. He reports current alcohol use. He reports that he does not use drugs.  Allergies  Allergen Reactions   Ace Inhibitors Cough and Other (See Comments)    Family History  Problem Relation Age of Onset  Hypertension Mother    Heart failure Mother    Hypertension Father    Alcohol abuse Father    Hypertension Sister    Multiple sclerosis Sister    Hypertension Sister    Hypertension Brother    Heart attack Maternal Grandmother 65   Scoliosis Other    Allergic rhinitis Neg Hx    Angioedema Neg Hx    Asthma Neg Hx    Eczema Neg Hx    Immunodeficiency Neg Hx    Urticaria Neg Hx    Colon cancer Neg Hx    Pancreatic cancer Neg Hx    Esophageal cancer Neg Hx     Prior to Admission medications   Medication Sig Start Date End Date Taking? Authorizing Provider  allopurinol  (ZYLOPRIM ) 100 MG tablet Take 1 tablet (100 mg total) by mouth daily. 10/23/23  Yes Dorothe Gaster, NP  amLODipine  (NORVASC ) 2.5 MG tablet Take 1 tablet (2.5 mg total) by mouth daily. 10/19/23  Yes Arnoldo Lapping, MD  azelastine  (ASTELIN ) 0.1 % nasal spray Place 2 sprays into both nostrils 2 (two) times daily as needed for allergies. 04/01/23  Yes Rochester Chuck, MD  B Complex-C-Zn-Folic Acid  (DIALYVITE  800/ZINC  PO) Take 1 tablet by mouth daily. 12/14/23  Yes [provider]  bictegravir-emtricitabine -tenofovir  AF (BIKTARVY ) 50-200-25 MG TABS tablet Take 1 tablet by mouth daily. 07/28/23  Yes Comer, Judithann Novas, MD  cetirizine  (ZYRTEC ) 10 MG tablet TAKE ONE TABLET BY MOUTH DAILY AT 9 a.m. 12/18/23  Yes Rochester Chuck, MD  cinacalcet  (SENSIPAR ) 90 MG tablet Take 90 mg by mouth daily. 10/08/23  Yes [provider]  diclofenac  Sodium (VOLTAREN ) 1 % GEL Apply 2 g topically 4 (four) times daily. Apply to chest pain when lidocaine  patch not in place Patient taking differently: Apply 2 g topically 4 (four) times daily as needed (pain). 09/14/22  Yes Wynetta Heckle, MD  ezetimibe  (ZETIA ) 10 MG tablet Take 10 mg by mouth daily. 02/10/24  Yes [provider]  famotidine  (PEPCID ) 20 MG tablet Take 1 tablet (20 mg total) by mouth 2 (two) times daily. 11/13/22  Yes Rochester Chuck, MD  ferric citrate  (AURYXIA ) 1 GM 210 MG(Fe) tablet Take 420 mg by mouth See admin instructions. Take 420 mg by mouth three times a day with meals and 210 mg with each snack   Yes [provider]  fluticasone  (FLOVENT  HFA) 110 MCG/ACT inhaler Inhale 2 puffs into the lungs 2 (two) times daily as needed ("for flares").   Yes [provider]  gabapentin  (NEURONTIN ) 300 MG capsule Take 1 capsule (300 mg total) by mouth daily as needed (pain/neuropathy). 09/14/22  Yes Wynetta Heckle, MD  HYDROcodone -acetaminophen  (NORCO/VICODIN) 5-325 MG tablet Take 1 tablet by mouth every 6 (six) hours as needed. 01/20/24 01/19/25 Yes Rhyne, Samantha J, PA-C  ipratropium (ATROVENT ) 0.06 % nasal spray USE TWO SPRAYS IN EACH NOSTRIL 2-3 times daily AS NEEDED 11/10/23  Yes Rochester Chuck, MD  lactulose  (CHRONULAC ) 10 GM/15ML solution Take 45 mLs (30 g total) by mouth daily as needed. Take 15-30 ml up to twice daily for constipation Patient taking differently:  Take 40 g by mouth daily as needed. 01/08/23  Yes Pyrtle, Amber Bail, MD  levalbuterol  (XOPENEX  HFA) 45 MCG/ACT inhaler Inhale 2 puffs into the lungs every 4 (four) hours as needed for wheezing. 01/08/24  Yes Rochester Chuck, MD  levothyroxine  (SYNTHROID ) 25 MCG tablet Take 1 tablet (25 mcg total) by mouth daily before  breakfast. 10/27/23  Yes Dorothe Gaster, NP  lubiprostone  (AMITIZA ) 24 MCG capsule Take 1 capsule (24 mcg total) by mouth 2 (two) times daily with a meal. NEEDS APPT FOR FURTHER REFILLS 10/19/23  Yes Pyrtle, Amber Bail, MD  metoprolol  tartrate (LOPRESSOR ) 25 MG tablet Take 1 tablet (25 mg total) by mouth 2 (two) times daily. 11/04/23  Yes Arnoldo Lapping, MD  Olopatadine  HCl (PATADAY ) 0.7 % SOLN Place 1 drop into both eyes 2 (two) times daily as needed (allergies). 11/13/22  Yes Rochester Chuck, MD  pantoprazole  (PROTONIX ) 40 MG tablet Take 1 tablet (40 mg total) by mouth daily. 06/01/23  Yes Rochester Chuck, MD  rOPINIRole  (REQUIP ) 1 MG tablet Take 1 tablet (1 mg total) by mouth at bedtime. 10/26/23  Yes Olalere, Adewale A, MD  rosuvastatin  (CRESTOR ) 10 MG tablet Take 1 tablet (10 mg total) by mouth daily. 10/19/23  Yes Arnoldo Lapping, MD  sevelamer  carbonate (RENVELA ) 800 MG tablet Take 2,400 mg by mouth daily. 04/22/23  Yes [provider]  SYMBICORT  160-4.5 MCG/ACT inhaler Inhale 2 puffs into the lungs in the morning and at bedtime. Patient taking differently: Inhale 2 puffs into the lungs 2 (two) times daily as needed (Asthma). 07/01/23  Yes Rochester Chuck, MD  tiZANidine  (ZANAFLEX ) 2 MG tablet Take 1 tablet (2 mg total) by mouth 2 (two) times daily as needed for muscle spasms. 07/07/23  Yes Dorothe Gaster, NP  Vitamin D , Ergocalciferol , (DRISDOL ) 1.25 MG (50000 UNIT) CAPS capsule Take 1 capsule (50,000 Units total) by mouth every Tuesday. 01/06/23  Yes Dorothe Gaster, NP  warfarin (COUMADIN ) 5 MG tablet Take 1 tablet to 2 tablets by mouth daily as prescribed by  coumadin  clinic Patient taking differently: Take 5-10 mg by mouth See admin instructions. Take 5mg  by mouth once daily on Monday, Wednesday and Friday. Take 10mg  by mouth all other days of the week. 03/01/24  Yes Arnoldo Lapping, MD  amiodarone  (PACERONE ) 200 MG tablet Take 200 mg by mouth daily. Patient not taking: Reported on 03/02/2024 02/26/24   [provider]  ELIQUIS  5 MG TABS tablet Take 5 mg by mouth 2 (two) times daily. Patient not taking: Reported on 03/02/2024 02/10/24   [provider]  iron  sucrose (VENOFER ) 20 MG/ML injection Inject into the vein 3 (three) times a week. 11/27/23 11/25/24  [provider]  Methoxy PEG-Epoetin  Beta (MIRCERA IJ) every 30 (thirty) days. 11/27/23 11/25/24  [provider]  Spacer/Aero-Holding Idelle Majors DEVI 1 Device by Does not apply route 2 (two) times a day. 03/01/19   BobbittColen Daunt, MD    Physical Exam: Vitals:   03/02/24 0945 03/02/24 1000 03/02/24 1300 03/02/24 1340  BP: 127/89 117/88 112/81   Pulse: 76 78 64   Resp: (!) 24 16 18    Temp:    98.1 F (36.7 C)  TempSrc:    Oral  SpO2: 100% 100% 97%   Weight:      Height:       General: Alert, oriented x3, mild distress Respiratory: Lungs clear to auscultation bilaterally with normal respiratory effort; no w/r/r Cardiovascular: Regular rate and rhythm w/o m/r/g  Data Reviewed:  Lab Results  Component Value Date   WBC 6.1 03/02/2024   HGB 12.2 (L) 03/02/2024   HCT 34.7 (L) 03/02/2024   MCV 94.8 03/02/2024   PLT 267 03/02/2024   Lab Results  Component Value Date   GLUCOSE 88 03/02/2024   CALCIUM  10.1 03/02/2024   NA  138 03/02/2024   K 2.8 (L) 03/02/2024   CO2 26 03/02/2024   CL 94 (L) 03/02/2024   BUN 23 (H) 03/02/2024   CREATININE 7.24 (H) 03/02/2024   Lab Results  Component Value Date   ALT 15 01/07/2024   AST 20 01/07/2024   ALKPHOS 110 01/07/2024   BILITOT 0.9 01/07/2024   Lab Results  Component Value Date   INR 3.0 03/01/2024   INR  2.0 02/02/2024   INR 1.1 01/20/2024    Radiology: [facility] Chest Port 1 View Result Date: 03/02/2024 CLINICAL DATA:  Chest pain. EXAM: PORTABLE CHEST 1 VIEW COMPARISON:  01/20/2024. FINDINGS: Bilateral lung fields are clear. Bilateral costophrenic angles are clear. Stable cardio-mediastinal silhouette. Mitral annuloplasty noted. No acute osseous abnormalities. The soft tissues are within normal limits. Right IJ hemodialysis catheter again seen with its tip overlying the cavoatrial junction region. Neurostimulator device battery pack noted overlying the right lateral middle chest wall, with lead extending superiorly towards the right neck. IMPRESSION: No active disease. Electronically Signed   By: Beula Brunswick M.D.   On: 03/02/2024 10:11    Assessment and Plan: 48M h/o CAD, HFmrEF, paroxysmal atrial fibrillation, mitral regugitation s/p MV repair on 07/2022, tricuspid regurgitation, s/p PEA arrest in 08/2022, ESRD s/p R internal jugular tunnled dialysis catheter and left arm AV fistula resection, HTN, HLD, aortic atherosclerosis, OSA, HIV, Hep B, Renal CA s/p L nephrectomy,and tobacco use p/w angina iso NSTEMI.  #NSTEMI, new, POA #Angina, new, POA #H/o nonobstructive CAD #Elevated troponin --Endorses chest pain that has not resolved; reportedly started at HD --LHC on 08/2022 showed nonobstructive CAD with 50% proximal and mid LAD stenosis, 30% in second OM, and 40% stenosis in proximal RCA. --High sensitivity troponin 66 > 63 (elevated and flat). EKG showed normal sinus rhythm with a rate of 80 with a prolonged QTCB of 548. Is normotensive. Chest x-ray showed no pulmonary infiltrates or pleural effusion. --Elevated and flat troponins are likely secondary to demand ischemia and ESRD as they are similar to prior troponins. -- Warfarin was recently subtherapeutic on 12/2023. Consider a VQ scan for a pulmonary embolism. PLAN: -Cardiology consulted; recs: see note from 5/7; TTE and VQ scan (defer for now  pending D dimer) -F/u D dimer -F/u troponin until peak  #ESRD Another potential source of elevated troponin; pt is a poor historian last dialysis session not obtained (but pt reported had cp during HD session on day of admission) -Renal consulted; recs: pending  #pAF -PTA lopressor  and warfarin (dosing per pharmacy)  #MV regurgitation s/p MV repair in 07/2022 -TTE per cards above  #HFmrEF Chest x-ray showed no pulmonary infiltrates or pleural effusion. Is euvolemic on exam. Most recent echo showed LVEF of 55%  -PTA lopressor  -TTE per cards above  #HTN -PTA amlodipine   #HLD -PTA Crestor  10mg  daily   Advance Care Planning:   Code Status: Full Code   Consults: Cardiology  Family Communication: N/A  Severity of Illness: The appropriate patient status for this patient is INPATIENT. Inpatient status is judged to be reasonable and necessary in order to provide the required intensity of service to ensure the patient's safety. The patient's presenting symptoms, physical exam findings, and initial radiographic and laboratory data in the context of their chronic comorbidities is felt to place them at high risk for further clinical deterioration. Furthermore, it is not anticipated that the patient will be medically stable for discharge from the hospital within 2 midnights of admission.   * I certify that at  the point of admission it is my clinical judgment that the patient will require inpatient hospital care spanning beyond 2 midnights from the point of admission due to high intensity of service, high risk for further deterioration and high frequency of surveillance required.*   ------- I spent 55 minutes reviewing previous labs/notes, obtaining separate history at the bedside, counseling/discussing the treatment plan outlined above, ordering medications/tests, and performing clinical documentation.  Author: Arne Langdon, MD 03/02/2024 4:06 PM  For on call review [email or web address].

## 2024-03-02 NOTE — H&P (Signed)
 History and Physical    Patient: Albert Eaton BJY:[id number] DOB: 1966/07/05 DOA: 03/02/2024 DOS: the patient was seen and examined on 03/02/2024 PCP: Dorothe Gaster, NP  Patient coming from: Home  Chief Complaint:  Chief Complaint  Patient presents with   Chest Pain   Shortness of Breath   HPI: Albert Eaton is a 58 y.o. male with medical history significant of CAD, HFmrEF, paroxysmal atrial fibrillation, mitral regugitation s/p MV repair on 07/2022, tricuspid regurgitation, s/p PEA arrest in 08/2022, ESRD s/p R internal jugular tunnled dialysis catheter and left arm AV fistula resection, HTN, HLD, aortic atherosclerosis, OSA, HIV, Hep B, Renal CA s/p L nephrectomy,and tobacco use p/w cp and NSTEMI.  Pt is a poor historian. He is actively endorsing SOB and deferring a full exam at this time. From what I can gather per pt, he awoke this morning, and was so acutely SOB that he activated EMS; of note, pt reported to cardiology that his chest discomfort/SOB began with dialysis. Pt endorses SOB at rest that worsens with activity, which is not normal for him.   In the ED, pt was evaluated by cardiology and admitted to medicine for ongoing w/u.  Review of Systems: As mentioned in the history of present illness. All other systems reviewed and are negative. Past Medical History:  Diagnosis Date   Anemia    Aortic atherosclerosis (HCC)    Arthritis    Ascending aorta dilation (HCC) 02/17/2023   Limited TTE 02/17/23: no effusion, s/p MV repair w trivial residual MR, asc aorta 39 mm, mod TR, severe LAE   Asthma    Cancer (HCC)    renal cyst   Chronic diastolic CHF (congestive heart failure) (HCC) 01/06/2017   Echo 7/16 [facility] in [place], [place]) Mild AI, mild LAE, mild concentric LVH, EF 55, normal wall motion, mild to moderate MR, mild PI, RVSP 55 // Echo 10/09/16 ([facility]):  Moderate LVH, grade 2 diastolic dysfunction, mild MR, moderate LAE    Dysrhythmia    paroxysmal afib/flutter    Esophagitis    ESRD (end stage renal disease) on dialysis [facility])    Dialysis Mon Wed Fri   GERD (gastroesophageal reflux disease)    Gout    no current problems   Heart murmur    "had it since he was a baby"- pt had echo 10/01/23   Hepatitis    Hep B   HIV (human immunodeficiency virus infection) (HCC)    HLD (hyperlipidemia)    Hypertension    Hypothyroidism    Mitral regurgitation    Myocardial infarction (HCC)    Neuropathy    Sleep apnea    does not use c-pap   Stroke [facility])    Wears glasses    Wears partial dentures    Past Surgical History:  Procedure Laterality Date   AV FISTULA PLACEMENT Left 07/02/2018   Procedure: Creation of Left arm BRACHIOBASILIC ARTERIOVENOUS  FISTULA;  Surgeon: Young Hensen, MD;  Location: [facility] OR;  Service: Vascular;  Laterality: Left;   BASCILIC VEIN TRANSPOSITION Left 08/27/2018   Procedure: Left arm BRACHIOBASILIC VEIN TRANSPOSITION SECOND STAGE;  Surgeon: Young Hensen, MD;  Location: [facility] OR;  Service: Vascular;  Laterality: Left;   BUBBLE STUDY  09/03/2020   Procedure: BUBBLE STUDY;  Surgeon: Elmyra Haggard, MD;  Location: [facility] ENDOSCOPY;  Service: Cardiovascular;;   BUBBLE STUDY  06/03/2022   Procedure: BUBBLE STUDY;  Surgeon: Euell Herrlich, MD;  Location: [facility] ENDOSCOPY;  Service:  Cardiology;;   CAPD INSERTION N/A 05/30/2021   Procedure: LAPAROSCOPIC INSERTION CONTINUOUS AMBULATORY PERITONEAL DIALYSIS  (CAPD) CATHETER;  Surgeon: Carlene Che, MD;  Location: [facility] OR;  Service: Vascular;  Laterality: N/A;   CAPD REMOVAL N/A 02/13/2022   Procedure: PERITONEAL DIALYSIS CATHETER REMOVAL;  Surgeon: Carlene Che, MD;  Location: [facility] OR;  Service: Vascular;  Laterality: N/A;   CHOLECYSTECTOMY     COLONOSCOPY W/ BIOPSIES AND POLYPECTOMY     DRUG INDUCED ENDOSCOPY Bilateral 07/06/2023   Procedure: DRUG INDUCED SLEEP ENDOSCOPY;  Surgeon: Virgina Grills, MD;  Location: [facility] OR;  Service: ENT;  Laterality: Bilateral;   GIVENS CAPSULE STUDY N/A  03/01/2018   Procedure: GIVENS CAPSULE STUDY;  Surgeon: Nannette Babe, MD;  Location: [facility] ENDOSCOPY;  Service: Gastroenterology;  Laterality: N/A;   HERNIA REPAIR     As baby   IMPLANTATION OF HYPOGLOSSAL NERVE STIMULATOR Right 01/14/2024   Procedure: RIGHT IMPLANTATION OF HYPOGLOSSAL NERVE STIMULATOR;  Surgeon: Virgina Grills, MD;  Location: [facility] OR;  Service: ENT;  Laterality: Right;   INSERTION OF DIALYSIS CATHETER Right 01/20/2024   Procedure: INSERTION OF DIALYSIS CATHETER USING 19cm PALINDROME CHRONIC CATHETER INTO THE RIGHT INTERNAL JUGULAR;  Surgeon: Young Hensen, MD;  Location: [facility] OR;  Service: Vascular;  Laterality: Right;   LEFT HEART CATH AND CORONARY ANGIOGRAPHY N/A 09/11/2022   Procedure: LEFT HEART CATH AND CORONARY ANGIOGRAPHY;  Surgeon: Lucendia Rusk, MD;  Location: [facility] INVASIVE CV LAB;  Service: Cardiovascular;  Laterality: N/A;   MITRAL VALVE REPAIR N/A 08/21/2022   Procedure: MINIMALLY INVASIVE MITRAL VALVE REPAIR USING RING ANNULOPLASTY SIMULUS 30mm;  Surgeon: Melene Sportsman, MD;  Location: [facility] OR;  Service: Open Heart Surgery;  Laterality: N/A;   MULTIPLE TOOTH EXTRACTIONS     NEPHRECTOMY Left 02/18/2021   NOSE SURGERY     RENAL BIOPSY     REVISON OF ARTERIOVENOUS FISTULA Left 01/20/2024   Procedure: REVISON OF ARTERIOVENOUS FISTULA WITH ARTERIAL PLICATION;  Surgeon: Young Hensen, MD;  Location: [facility] OR;  Service: Vascular;  Laterality: Left;   RIGHT/LEFT HEART CATH AND CORONARY ANGIOGRAPHY N/A 09/04/2021   Procedure: RIGHT/LEFT HEART CATH AND CORONARY ANGIOGRAPHY;  Surgeon: Odie Benne, MD;  Location: [facility] INVASIVE CV LAB;  Service: Cardiovascular;  Laterality: N/A;   RIGHT/LEFT HEART CATH AND CORONARY ANGIOGRAPHY N/A 08/01/2022   Procedure: RIGHT/LEFT HEART CATH AND CORONARY ANGIOGRAPHY;  Surgeon: Arnoldo Lapping, MD;  Location: [facility] INVASIVE CV LAB;  Service: Cardiovascular;  Laterality: N/A;   TEE WITHOUT CARDIOVERSION N/A 09/03/2020   Procedure:  TRANSESOPHAGEAL ECHOCARDIOGRAM (TEE);  Surgeon: Elmyra Haggard, MD;  Location: [facility] ENDOSCOPY;  Service: Cardiovascular;  Laterality: N/A;   TEE WITHOUT CARDIOVERSION N/A 06/03/2022   Procedure: TRANSESOPHAGEAL ECHOCARDIOGRAM (TEE);  Surgeon: Euell Herrlich, MD;  Location: [facility] ENDOSCOPY;  Service: Cardiology;  Laterality: N/A;   TEE WITHOUT CARDIOVERSION N/A 08/21/2022   Procedure: TRANSESOPHAGEAL ECHOCARDIOGRAM (TEE);  Surgeon: Melene Sportsman, MD;  Location: [facility] OR;  Service: Open Heart Surgery;  Laterality: N/A;   UPPER GI ENDOSCOPY  04/05/2021   Social History:  reports that he quit smoking about 25 years ago. His smoking use included cigarettes. He started smoking about 43 years ago. He has a 18 pack-year smoking history. He has never used smokeless tobacco. He reports current alcohol use. He reports that he does not use drugs.  Allergies  Allergen Reactions   Ace Inhibitors Cough and Other (See Comments)    Family History  Problem Relation Age of Onset  Hypertension Mother    Heart failure Mother    Hypertension Father    Alcohol abuse Father    Hypertension Sister    Multiple sclerosis Sister    Hypertension Sister    Hypertension Brother    Heart attack Maternal Grandmother 65   Scoliosis Other    Allergic rhinitis Neg Hx    Angioedema Neg Hx    Asthma Neg Hx    Eczema Neg Hx    Immunodeficiency Neg Hx    Urticaria Neg Hx    Colon cancer Neg Hx    Pancreatic cancer Neg Hx    Esophageal cancer Neg Hx     Prior to Admission medications   Medication Sig Start Date End Date Taking? Authorizing Provider  allopurinol  (ZYLOPRIM ) 100 MG tablet Take 1 tablet (100 mg total) by mouth daily. 10/23/23  Yes Dorothe Gaster, NP  amLODipine  (NORVASC ) 2.5 MG tablet Take 1 tablet (2.5 mg total) by mouth daily. 10/19/23  Yes Arnoldo Lapping, MD  azelastine  (ASTELIN ) 0.1 % nasal spray Place 2 sprays into both nostrils 2 (two) times daily as needed for allergies. 04/01/23  Yes Rochester Chuck, MD  B Complex-C-Zn-Folic Acid  (DIALYVITE  800/ZINC  PO) Take 1 tablet by mouth daily. 12/14/23  Yes [provider]  bictegravir-emtricitabine -tenofovir  AF (BIKTARVY ) 50-200-25 MG TABS tablet Take 1 tablet by mouth daily. 07/28/23  Yes Comer, Judithann Novas, MD  cetirizine  (ZYRTEC ) 10 MG tablet TAKE ONE TABLET BY MOUTH DAILY AT 9 a.m. 12/18/23  Yes Rochester Chuck, MD  cinacalcet  (SENSIPAR ) 90 MG tablet Take 90 mg by mouth daily. 10/08/23  Yes [provider]  diclofenac  Sodium (VOLTAREN ) 1 % GEL Apply 2 g topically 4 (four) times daily. Apply to chest pain when lidocaine  patch not in place Patient taking differently: Apply 2 g topically 4 (four) times daily as needed (pain). 09/14/22  Yes Wynetta Heckle, MD  ezetimibe  (ZETIA ) 10 MG tablet Take 10 mg by mouth daily. 02/10/24  Yes [provider]  famotidine  (PEPCID ) 20 MG tablet Take 1 tablet (20 mg total) by mouth 2 (two) times daily. 11/13/22  Yes Rochester Chuck, MD  ferric citrate  (AURYXIA ) 1 GM 210 MG(Fe) tablet Take 420 mg by mouth See admin instructions. Take 420 mg by mouth three times a day with meals and 210 mg with each snack   Yes [provider]  fluticasone  (FLOVENT  HFA) 110 MCG/ACT inhaler Inhale 2 puffs into the lungs 2 (two) times daily as needed ("for flares").   Yes [provider]  gabapentin  (NEURONTIN ) 300 MG capsule Take 1 capsule (300 mg total) by mouth daily as needed (pain/neuropathy). 09/14/22  Yes Wynetta Heckle, MD  HYDROcodone -acetaminophen  (NORCO/VICODIN) 5-325 MG tablet Take 1 tablet by mouth every 6 (six) hours as needed. 01/20/24 01/19/25 Yes Rhyne, Samantha J, PA-C  ipratropium (ATROVENT ) 0.06 % nasal spray USE TWO SPRAYS IN EACH NOSTRIL 2-3 times daily AS NEEDED 11/10/23  Yes Rochester Chuck, MD  lactulose  (CHRONULAC ) 10 GM/15ML solution Take 45 mLs (30 g total) by mouth daily as needed. Take 15-30 ml up to twice daily for constipation Patient taking differently:  Take 40 g by mouth daily as needed. 01/08/23  Yes Pyrtle, Amber Bail, MD  levalbuterol  (XOPENEX  HFA) 45 MCG/ACT inhaler Inhale 2 puffs into the lungs every 4 (four) hours as needed for wheezing. 01/08/24  Yes Rochester Chuck, MD  levothyroxine  (SYNTHROID ) 25 MCG tablet Take 1 tablet (25 mcg total) by mouth daily before  breakfast. 10/27/23  Yes Dorothe Gaster, NP  lubiprostone  (AMITIZA ) 24 MCG capsule Take 1 capsule (24 mcg total) by mouth 2 (two) times daily with a meal. NEEDS APPT FOR FURTHER REFILLS 10/19/23  Yes Pyrtle, Amber Bail, MD  metoprolol  tartrate (LOPRESSOR ) 25 MG tablet Take 1 tablet (25 mg total) by mouth 2 (two) times daily. 11/04/23  Yes Arnoldo Lapping, MD  Olopatadine  HCl (PATADAY ) 0.7 % SOLN Place 1 drop into both eyes 2 (two) times daily as needed (allergies). 11/13/22  Yes Rochester Chuck, MD  pantoprazole  (PROTONIX ) 40 MG tablet Take 1 tablet (40 mg total) by mouth daily. 06/01/23  Yes Rochester Chuck, MD  rOPINIRole  (REQUIP ) 1 MG tablet Take 1 tablet (1 mg total) by mouth at bedtime. 10/26/23  Yes Olalere, Adewale A, MD  rosuvastatin  (CRESTOR ) 10 MG tablet Take 1 tablet (10 mg total) by mouth daily. 10/19/23  Yes Arnoldo Lapping, MD  sevelamer  carbonate (RENVELA ) 800 MG tablet Take 2,400 mg by mouth daily. 04/22/23  Yes [provider]  SYMBICORT  160-4.5 MCG/ACT inhaler Inhale 2 puffs into the lungs in the morning and at bedtime. Patient taking differently: Inhale 2 puffs into the lungs 2 (two) times daily as needed (Asthma). 07/01/23  Yes Rochester Chuck, MD  tiZANidine  (ZANAFLEX ) 2 MG tablet Take 1 tablet (2 mg total) by mouth 2 (two) times daily as needed for muscle spasms. 07/07/23  Yes Dorothe Gaster, NP  Vitamin D , Ergocalciferol , (DRISDOL ) 1.25 MG (50000 UNIT) CAPS capsule Take 1 capsule (50,000 Units total) by mouth every Tuesday. 01/06/23  Yes Dorothe Gaster, NP  warfarin (COUMADIN ) 5 MG tablet Take 1 tablet to 2 tablets by mouth daily as prescribed by  coumadin  clinic Patient taking differently: Take 5-10 mg by mouth See admin instructions. Take 5mg  by mouth once daily on Monday, Wednesday and Friday. Take 10mg  by mouth all other days of the week. 03/01/24  Yes Arnoldo Lapping, MD  amiodarone  (PACERONE ) 200 MG tablet Take 200 mg by mouth daily. Patient not taking: Reported on 03/02/2024 02/26/24   [provider]  ELIQUIS  5 MG TABS tablet Take 5 mg by mouth 2 (two) times daily. Patient not taking: Reported on 03/02/2024 02/10/24   [provider]  iron  sucrose (VENOFER ) 20 MG/ML injection Inject into the vein 3 (three) times a week. 11/27/23 11/25/24  [provider]  Methoxy PEG-Epoetin  Beta (MIRCERA IJ) every 30 (thirty) days. 11/27/23 11/25/24  [provider]  Spacer/Aero-Holding Idelle Majors DEVI 1 Device by Does not apply route 2 (two) times a day. 03/01/19   BobbittColen Daunt, MD    Physical Exam: Vitals:   03/02/24 0945 03/02/24 1000 03/02/24 1300 03/02/24 1340  BP: 127/89 117/88 112/81   Pulse: 76 78 64   Resp: (!) 24 16 18    Temp:    98.1 F (36.7 C)  TempSrc:    Oral  SpO2: 100% 100% 97%   Weight:      Height:       General: Alert, oriented x3, mild distress Respiratory: Lungs clear to auscultation bilaterally with normal respiratory effort; no w/r/r Cardiovascular: Regular rate and rhythm w/o m/r/g  Data Reviewed:  Lab Results  Component Value Date   WBC 6.1 03/02/2024   HGB 12.2 (L) 03/02/2024   HCT 34.7 (L) 03/02/2024   MCV 94.8 03/02/2024   PLT 267 03/02/2024   Lab Results  Component Value Date   GLUCOSE 88 03/02/2024   CALCIUM  10.1 03/02/2024   NA  138 03/02/2024   K 2.8 (L) 03/02/2024   CO2 26 03/02/2024   CL 94 (L) 03/02/2024   BUN 23 (H) 03/02/2024   CREATININE 7.24 (H) 03/02/2024   Lab Results  Component Value Date   ALT 15 01/07/2024   AST 20 01/07/2024   ALKPHOS 110 01/07/2024   BILITOT 0.9 01/07/2024   Lab Results  Component Value Date   INR 3.0 03/01/2024   INR  2.0 02/02/2024   INR 1.1 01/20/2024    Radiology: [facility] Chest Port 1 View Result Date: 03/02/2024 CLINICAL DATA:  Chest pain. EXAM: PORTABLE CHEST 1 VIEW COMPARISON:  01/20/2024. FINDINGS: Bilateral lung fields are clear. Bilateral costophrenic angles are clear. Stable cardio-mediastinal silhouette. Mitral annuloplasty noted. No acute osseous abnormalities. The soft tissues are within normal limits. Right IJ hemodialysis catheter again seen with its tip overlying the cavoatrial junction region. Neurostimulator device battery pack noted overlying the right lateral middle chest wall, with lead extending superiorly towards the right neck. IMPRESSION: No active disease. Electronically Signed   By: Beula Brunswick M.D.   On: 03/02/2024 10:11    Assessment and Plan: 48M h/o CAD, HFmrEF, paroxysmal atrial fibrillation, mitral regugitation s/p MV repair on 07/2022, tricuspid regurgitation, s/p PEA arrest in 08/2022, ESRD s/p R internal jugular tunnled dialysis catheter and left arm AV fistula resection, HTN, HLD, aortic atherosclerosis, OSA, HIV, Hep B, Renal CA s/p L nephrectomy,and tobacco use p/w angina iso NSTEMI.  #NSTEMI, new, POA #Angina, new, POA #H/o nonobstructive CAD #Elevated troponin --Endorses chest pain that has not resolved; reportedly started at HD --LHC on 08/2022 showed nonobstructive CAD with 50% proximal and mid LAD stenosis, 30% in second OM, and 40% stenosis in proximal RCA. --High sensitivity troponin 66 > 63 (elevated and flat). EKG showed normal sinus rhythm with a rate of 80 with a prolonged QTCB of 548. Is normotensive. Chest x-ray showed no pulmonary infiltrates or pleural effusion. --Elevated and flat troponins are likely secondary to demand ischemia and ESRD as they are similar to prior troponins. -- Warfarin was recently subtherapeutic on 12/2023. Consider a VQ scan for a pulmonary embolism. PLAN: -Cardiology consulted; recs: see note from 5/7; TTE and VQ scan (defer for now  pending D dimer) -F/u D dimer -F/u troponin until peak  #ESRD Another potential source of elevated troponin; pt is a poor historian last dialysis session not obtained (but pt reported had cp during HD session on day of admission) -Renal consulted; recs: pending  #pAF -PTA lopressor  and warfarin (dosing per pharmacy)  #MV regurgitation s/p MV repair in 07/2022 -TTE per cards above  #HFmrEF Chest x-ray showed no pulmonary infiltrates or pleural effusion. Is euvolemic on exam. Most recent echo showed LVEF of 55%  -PTA lopressor  -TTE per cards above  #HTN -PTA amlodipine   #HLD -PTA Crestor  10mg  daily   Advance Care Planning:   Code Status: Full Code   Consults: Cardiology  Family Communication: N/A  Severity of Illness: The appropriate patient status for this patient is INPATIENT. Inpatient status is judged to be reasonable and necessary in order to provide the required intensity of service to ensure the patient's safety. The patient's presenting symptoms, physical exam findings, and initial radiographic and laboratory data in the context of their chronic comorbidities is felt to place them at high risk for further clinical deterioration. Furthermore, it is not anticipated that the patient will be medically stable for discharge from the hospital within 2 midnights of admission.   * I certify that at  the point of admission it is my clinical judgment that the patient will require inpatient hospital care spanning beyond 2 midnights from the point of admission due to high intensity of service, high risk for further deterioration and high frequency of surveillance required.*   ------- I spent 55 minutes reviewing previous labs/notes, obtaining separate history at the bedside, counseling/discussing the treatment plan outlined above, ordering medications/tests, and performing clinical documentation.  Author: Arne Langdon, MD 03/02/2024 4:06 PM  For on call review [email or web address].

## 2024-03-02 NOTE — ED Provider Notes (Signed)
 [place] [facility]MENT AT [facility] Provider Note   CSN: [id number] Arrival date & time: 03/02/24  6269     History  Chief Complaint  Patient presents with   Chest Pain   Shortness of Breath    Albert Eaton is a 58 y.o. male.  HPI     58 year old male comes in with chief complaint of chest pain and shortness of breath. Patient has past medical history of CHF, ESRD on dialysis, Crohn's disease, hyperlipidemia, valve replacement and nonobstructive CAD.  Patient indicates that he yesterday night he was having shortness of breath with laying flat.  He proceeded to go to dialysis, and completed 3 hours of it.  In the middle of dialysis he started having central chest pain described as heaviness.  Patient shortness of breath although better has not resolved.  Patient denies any associated nausea, diaphoresis, cough.  No recent history of similar pain.  Home Medications Prior to Admission medications   Medication Sig Start Date End Date Taking? Authorizing Provider  allopurinol  (ZYLOPRIM ) 100 MG tablet Take 1 tablet (100 mg total) by mouth daily. 10/23/23   Dorothe Gaster, NP  amLODipine  (NORVASC ) 2.5 MG tablet Take 1 tablet (2.5 mg total) by mouth daily. 10/19/23   Arnoldo Lapping, MD  azelastine  (ASTELIN ) 0.1 % nasal spray Place 2 sprays into both nostrils 2 (two) times daily as needed for allergies. 04/01/23   Rochester Chuck, MD  B Complex-C-Zn-Folic Acid  (DIALYVITE  800/ZINC  PO) Take 1 tablet by mouth daily. 12/14/23   [provider]  bictegravir-emtricitabine -tenofovir  AF (BIKTARVY ) 50-200-25 MG TABS tablet Take 1 tablet by mouth daily. 07/28/23   Lina Render, MD  cetirizine  (ZYRTEC ) 10 MG tablet TAKE ONE TABLET BY MOUTH DAILY AT 9 a.m. 12/18/23   Rochester Chuck, MD  cinacalcet  (SENSIPAR ) 90 MG tablet Take 90 mg by mouth daily. 10/08/23   [provider]  diclofenac  Sodium (VOLTAREN ) 1 % GEL Apply 2 g topically 4 (four) times  daily. Apply to chest pain when lidocaine  patch not in place Patient taking differently: Apply 2 g topically 4 (four) times daily as needed (pain). 09/14/22   Wynetta Heckle, MD  famotidine  (PEPCID ) 20 MG tablet Take 1 tablet (20 mg total) by mouth 2 (two) times daily. 11/13/22   Rochester Chuck, MD  ferric citrate  (AURYXIA ) 1 GM 210 MG(Fe) tablet Take 420 mg by mouth See admin instructions. Take 420 mg by mouth three times a day with meals and 210 mg with each snack    [provider]  fluticasone  (FLOVENT  HFA) 110 MCG/ACT inhaler Inhale 2 puffs into the lungs 2 (two) times daily as needed ("for flares").    [provider]  gabapentin  (NEURONTIN ) 300 MG capsule Take 1 capsule (300 mg total) by mouth daily as needed (pain/neuropathy). 09/14/22   Wynetta Heckle, MD  HYDROcodone -acetaminophen  (NORCO/VICODIN) 5-325 MG tablet Take 1 tablet by mouth every 6 (six) hours as needed. 01/20/24 01/19/25  Rhyne, Samantha J, PA-C  ipratropium (ATROVENT ) 0.06 % nasal spray USE TWO SPRAYS IN EACH NOSTRIL 2-3 times daily AS NEEDED 11/10/23   Rochester Chuck, MD  iron  sucrose (VENOFER ) 20 MG/ML injection Inject into the vein 3 (three) times a week. 11/27/23 11/25/24  [provider]  lactulose  (CHRONULAC ) 10 GM/15ML solution Take 45 mLs (30 g total) by mouth daily as needed. Take 15-30 ml up to twice daily for constipation Patient taking differently: Take 40 g by mouth daily as  needed. 01/08/23   Pyrtle, Amber Bail, MD  levalbuterol  (XOPENEX  HFA) 45 MCG/ACT inhaler Inhale 2 puffs into the lungs every 4 (four) hours as needed for wheezing. 01/08/24   Rochester Chuck, MD  levothyroxine  (SYNTHROID ) 25 MCG tablet Take 1 tablet (25 mcg total) by mouth daily before breakfast. 10/27/23   Dorothe Gaster, NP  lubiprostone  (AMITIZA ) 24 MCG capsule Take 1 capsule (24 mcg total) by mouth 2 (two) times daily with a meal. NEEDS APPT FOR FURTHER REFILLS 10/19/23   Pyrtle, Amber Bail, MD  Methoxy PEG-Epoetin   Beta (MIRCERA IJ) every 30 (thirty) days. 11/27/23 11/25/24  [provider]  metoprolol  tartrate (LOPRESSOR ) 25 MG tablet Take 1 tablet (25 mg total) by mouth 2 (two) times daily. 11/04/23   Cooper, Michael, MD  Olopatadine  HCl (PATADAY ) 0.7 % SOLN Place 1 drop into both eyes 2 (two) times daily as needed (allergies). 11/13/22   Rochester Chuck, MD  pantoprazole  (PROTONIX ) 40 MG tablet Take 1 tablet (40 mg total) by mouth daily. 06/01/23   Rochester Chuck, MD  rOPINIRole  (REQUIP ) 1 MG tablet Take 1 tablet (1 mg total) by mouth at bedtime. 10/26/23   Margaretann Sharper, MD  rosuvastatin  (CRESTOR ) 10 MG tablet Take 1 tablet (10 mg total) by mouth daily. 10/19/23   Arnoldo Lapping, MD  sevelamer  carbonate (RENVELA ) 800 MG tablet Take 2,400 mg by mouth daily. 04/22/23   [provider]  Spacer/Aero-Holding Idelle Majors DEVI 1 Device by Does not apply route 2 (two) times a day. 03/01/19   Bobbitt, Colen Daunt, MD  SYMBICORT  160-4.5 MCG/ACT inhaler Inhale 2 puffs into the lungs in the morning and at bedtime. Patient taking differently: Inhale 2 puffs into the lungs 2 (two) times daily as needed (Asthma). 07/01/23   Rochester Chuck, MD  tiZANidine  (ZANAFLEX ) 2 MG tablet Take 1 tablet (2 mg total) by mouth 2 (two) times daily as needed for muscle spasms. 07/07/23   Dorothe Gaster, NP  Vitamin D , Ergocalciferol , (DRISDOL ) 1.25 MG (50000 UNIT) CAPS capsule Take 1 capsule (50,000 Units total) by mouth every Tuesday. 01/06/23   Dorothe Gaster, NP  warfarin (COUMADIN ) 5 MG tablet Take 1 tablet to 2 tablets by mouth daily as prescribed by coumadin  clinic 03/01/24   Arnoldo Lapping, MD      Allergies    Ace inhibitors    Review of Systems   Review of Systems  All other systems reviewed and are negative.   Physical Exam Updated Vital Signs BP 112/81   Pulse 64   Temp 98.1 F (36.7 C) (Oral)   Resp 18   Ht 5\' 10"  (1.778 m)   Wt 90.3 kg   SpO2 97%   BMI 28.56 kg/m  Physical  Exam Vitals and nursing note reviewed.  Constitutional:      Appearance: He is well-developed.  HENT:     Head: Atraumatic.  Cardiovascular:     Rate and Rhythm: Normal rate.     Heart sounds: Normal heart sounds.  Pulmonary:     Effort: Pulmonary effort is normal.     Breath sounds: Normal breath sounds.  Musculoskeletal:     Cervical back: Neck supple.  Skin:    General: Skin is warm.  Neurological:     Mental Status: He is alert and oriented to person, place, and time.     ED Results / Procedures / Treatments   Labs (all labs ordered are listed, but only abnormal results are displayed) Labs  Reviewed  BASIC METABOLIC PANEL WITH GFR - Abnormal; Notable for the following components:      Result Value   Potassium 2.8 (*)    Chloride 94 (*)    BUN 23 (*)    Creatinine, Ser 7.24 (*)    GFR, Estimated 8 (*)    Anion gap 18 (*)    All other components within normal limits  CBC - Abnormal; Notable for the following components:   RBC 3.66 (*)    Hemoglobin 12.2 (*)    HCT 34.7 (*)    All other components within normal limits  TROPONIN I (HIGH SENSITIVITY) - Abnormal; Notable for the following components:   Troponin I (High Sensitivity) 66 (*)    All other components within normal limits  TROPONIN I (HIGH SENSITIVITY) - Abnormal; Notable for the following components:   Troponin I (High Sensitivity) 63 (*)    All other components within normal limits  TROPONIN I (HIGH SENSITIVITY)    EKG EKG Interpretation Date/Time:  Wednesday Mar 02 2024 09:13:40 EDT Ventricular Rate:  80 PR Interval:  175 QRS Duration:  108 QT Interval:  475 QTC Calculation: 548 R Axis:   45  Text Interpretation: Sinus rhythm Multiple premature complexes, vent & supraven Prolonged QT interval No significant change since last tracing Confirmed by Deatra Face ([id number]) on 03/02/2024 12:27:36 PM  Date: 03/02/2024  Rate: 80  Rhythm: normal sinus rhythm  QRS Axis: normal  Intervals: prolonged QT   ST/T Wave abnormalities: normal  Conduction Disutrbances: none  Narrative Interpretation: unremarkable   Radiology DG Chest Port 1 View Result Date: 03/02/2024 CLINICAL DATA:  Chest pain. EXAM: PORTABLE CHEST 1 VIEW COMPARISON:  01/20/2024. FINDINGS: Bilateral lung fields are clear. Bilateral costophrenic angles are clear. Stable cardio-mediastinal silhouette. Mitral annuloplasty noted. No acute osseous abnormalities. The soft tissues are within normal limits. Right IJ hemodialysis catheter again seen with its tip overlying the cavoatrial junction region. Neurostimulator device battery pack noted overlying the right lateral middle chest wall, with lead extending superiorly towards the right neck. IMPRESSION: No active disease. Electronically Signed   By: Beula Brunswick M.D.   On: 03/02/2024 10:11    Procedures Procedures    Medications Ordered in ED Medications  nitroGLYCERIN  (NITROSTAT ) SL tablet 0.4 mg (0.4 mg Sublingual Given 03/02/24 0959)  aspirin  chewable tablet 324 mg (324 mg Oral Given 03/02/24 1138)  fentaNYL  (SUBLIMAZE ) injection 50 mcg (50 mcg Intravenous Given 03/02/24 1241)    ED Course/ Medical Decision Making/ A&P                                 Medical Decision Making Amount and/or Complexity of Data Reviewed Labs: ordered. Radiology: ordered.  Risk OTC drugs. Prescription drug management.   This patient presents to the ED with chief complaint(s) of chest pain with pertinent past medical history of nonobstructive CAD, hyperlipidemia, ESRD, valve replacement surgery.The complaint involves an extensive differential diagnosis and also carries with it a high risk of complications and morbidity.     The differential diagnosis considered for this patient includes  ACS syndrome Aortic dissection CHF exacerbation Valvular disorder Myocarditis Pericarditis Endocarditis Pericardial effusion / tamponade Pneumonia Pleural effusion / Pulmonary  edema PE Pneumothorax Musculoskeletal pain   The initial plan is to get basic labs, delta troponin.  Will give him 1 sublingual nitroglycerin .   Additional history obtained: Records reviewed  previous cardiology records.  Patient did have a  cath in 2023 that reveals:   Mid LAD lesion is 50% stenosed.   Prox LAD lesion is 50% stenosed.   Prox RCA lesion is 40% stenosed.   2nd Mrg lesion is 30% stenosed.   LV end diastolic pressure is mildly elevated.   There is no aortic valve stenosis.  Independent labs interpretation:  The following labs were independently interpreted: Troponin is 6 6 and 63. No profound anemia. Baseline renal function and electrolytes.  Low potassium noted, but we will not replete given his dialysis history.  Independent visualization and interpretation of imaging: - I independently visualized the following imaging with scope of interpretation limited to determining acute life threatening conditions related to emergency care: X-ray of the chest, which revealed no evidence of pneumothorax.  Treatment and Reassessment: Patient states that the morphine  gave him slight relief.  He received fentanyl  thereafter.  The pain did get better with fentanyl .  Consultation: - Consulted or discussed management/test interpretation with external professional: Cardiology service. They have seen the patient.  They recommend medicine admission at this time.  Pt made aware of the plan.  Final Clinical Impression(s) / ED Diagnoses Final diagnoses:  Angina pectoris [facility])    Rx / DC Orders ED Discharge Orders     None         Deatra Face, MD 03/02/24 1519

## 2024-03-02 NOTE — Progress Notes (Signed)
 New Admission Note: 72m-14   Arrival Method: ED wheelchair Mental Orientation: a/ox4 Telemetry: box 14 Assessment:completed Skin: intact IV: right ac Pain:n/a Tubes: n/a Safety Measures: nonskid socks Admission: completed Orientation: Patient has been oriented to the room, unit and staff.  Family: n/a Belongings: at bedside  Orders have been reviewed and implemented. Will continue to monitor the patient. Call light has been placed within reach and bed alarm has been activated.   Winferd Hatter BSN, RN-BC Phone number: [phone]

## 2024-03-02 NOTE — Progress Notes (Signed)
 PHARMACY - ANTICOAGULATION CONSULT NOTE  Pharmacy Consult for warfarin Indication: atrial fibrillation  Allergies  Allergen Reactions   Ace Inhibitors Cough and Other (See Comments)    Patient Measurements: Height: 5\' 10"  (177.8 cm) Weight: 90.3 kg (199 lb 1.2 oz) IBW/kg (Calculated) : 73 HEPARIN  DW (KG): 90.3  Vital Signs: Temp: 98.1 F (36.7 C) (05/07 1340) Temp Source: Oral (05/07 1340) BP: 112/81 (05/07 1300) Pulse Rate: 64 (05/07 1300)  Labs: Recent Labs    03/01/24 1116 03/02/24 0947 03/02/24 1147 03/02/24 1423 03/02/24 1552  HGB  --  12.2*  --   --   --   HCT  --  34.7*  --   --   --   PLT  --  267  --   --   --   LABPROT  --   --   --   --  26.1*  INR 3.0  --   --   --  2.4*  CREATININE  --  7.24*  --   --   --   TROPONINIHS  --  66* 63* 68*  --     Estimated Creatinine Clearance: 12.7 mL/min (A) (by C-G formula based on SCr of 7.24 mg/dL (H)).   Medical History: Past Medical History:  Diagnosis Date   Anemia    Aortic atherosclerosis (HCC)    Arthritis    Ascending aorta dilation (HCC) 02/17/2023   Limited TTE 02/17/23: no effusion, s/p MV repair w trivial residual MR, asc aorta 39 mm, mod TR, severe LAE   Asthma    Cancer (HCC)    renal cyst   Chronic diastolic CHF (congestive heart failure) (HCC) 01/06/2017   Echo 7/16 [facility] in [place], [place]) Mild AI, mild LAE, mild concentric LVH, EF 55, normal wall motion, mild to moderate MR, mild PI, RVSP 55 // Echo 10/09/16 ([facility]):  Moderate LVH, grade 2 diastolic dysfunction, mild MR, moderate LAE    Dysrhythmia    paroxysmal afib/flutter   Esophagitis    ESRD (end stage renal disease) on dialysis [facility])    Dialysis Mon Wed Fri   GERD (gastroesophageal reflux disease)    Gout    no current problems   Heart murmur    "had it since he was a baby"- pt had echo 10/01/23   Hepatitis    Hep B   HIV (human immunodeficiency virus infection) (HCC)    HLD (hyperlipidemia)    Hypertension     Hypothyroidism    Mitral regurgitation    Myocardial infarction (HCC)    Neuropathy    Sleep apnea    does not use c-pap   Stroke [facility])    Wears glasses    Wears partial dentures     Assessment: Patient admitted with CP and SOB, trop mildly elevated. On warfarin PTA for Afib and MVR. Warfarin PTA 5mg  on MWF and 10mg  all other days. HgB 12.2 and PLTS 267. Last dose of warfarin 5/6 AM.   Goal of Therapy:  INR 2-3 Monitor platelets by anticoagulation protocol: Yes   Plan:  INR therapeutic today at 2.4.  Give home dose of warfarin 5mg .  Daily INR checks.   Mamie Searles, PharmD, BCCCP  03/02/2024,4:34 PM

## 2024-03-03 ENCOUNTER — Ambulatory Visit: Admitting: Pulmonary Disease

## 2024-03-03 ENCOUNTER — Inpatient Hospital Stay (HOSPITAL_COMMUNITY)

## 2024-03-03 ENCOUNTER — Ambulatory Visit: Admitting: Primary Care

## 2024-03-03 DIAGNOSIS — J9601 Acute respiratory failure with hypoxia: Secondary | ICD-10-CM | POA: Diagnosis not present

## 2024-03-03 DIAGNOSIS — I340 Nonrheumatic mitral (valve) insufficiency: Secondary | ICD-10-CM | POA: Diagnosis not present

## 2024-03-03 DIAGNOSIS — I214 Non-ST elevation (NSTEMI) myocardial infarction: Secondary | ICD-10-CM | POA: Diagnosis not present

## 2024-03-03 DIAGNOSIS — R079 Chest pain, unspecified: Secondary | ICD-10-CM | POA: Diagnosis not present

## 2024-03-03 LAB — MRSA NEXT GEN BY PCR, NASAL: MRSA by PCR Next Gen: NOT DETECTED

## 2024-03-03 LAB — ECHOCARDIOGRAM COMPLETE
AR max vel: 2.72 cm2
AV Area VTI: 2.37 cm2
AV Area mean vel: 2.62 cm2
AV Mean grad: 4 mmHg
AV Peak grad: 7.3 mmHg
Ao pk vel: 1.35 m/s
Calc EF: 52.5 %
Height: 70 in
MV M vel: 5.14 m/s
MV Peak grad: 105.7 mmHg
MV VTI: 1.61 cm2
S' Lateral: 4 cm
Single Plane A2C EF: 45.1 %
Single Plane A4C EF: 58.4 %
Weight: 3185.21 [oz_av]

## 2024-03-03 LAB — PROTIME-INR
INR: 2.5 — ABNORMAL HIGH (ref 0.8–1.2)
Prothrombin Time: 27.2 s — ABNORMAL HIGH (ref 11.4–15.2)

## 2024-03-03 LAB — RENAL FUNCTION PANEL
Albumin: 3.6 g/dL (ref 3.5–5.0)
Anion gap: 14 (ref 5–15)
BUN: 37 mg/dL — ABNORMAL HIGH (ref 6–20)
CO2: 24 mmol/L (ref 22–32)
Calcium: 8.7 mg/dL — ABNORMAL LOW (ref 8.9–10.3)
Chloride: 95 mmol/L — ABNORMAL LOW (ref 98–111)
Creatinine, Ser: 10.05 mg/dL — ABNORMAL HIGH (ref 0.61–1.24)
GFR, Estimated: 6 mL/min — ABNORMAL LOW (ref >60)
Glucose, Bld: 165 mg/dL — ABNORMAL HIGH (ref 70–99)
Phosphorus: 4.3 mg/dL (ref 2.5–4.6)
Potassium: 2.9 mmol/L — ABNORMAL LOW (ref 3.5–5.1)
Sodium: 133 mmol/L — ABNORMAL LOW (ref 135–145)

## 2024-03-03 MED ORDER — CHLORHEXIDINE GLUCONATE CLOTH 2 % EX PADS: 6.0000 | MEDICATED_PAD | Freq: Every day | CUTANEOUS | Status: DC

## 2024-03-03 MED ORDER — SODIUM CHLORIDE 0.9% FLUSH: 10.0000 mL | Freq: Two times a day (BID) | INTRAVENOUS | Status: DC

## 2024-03-03 MED ORDER — RENA-VITE PO TABS: 1.0000 | ORAL_TABLET | Freq: Every day | ORAL | Status: DC

## 2024-03-03 MED ORDER — IPRATROPIUM-ALBUTEROL 0.5-2.5 (3) MG/3ML IN SOLN: 3.0000 mL | Freq: Four times a day (QID) | RESPIRATORY_TRACT | Status: DC | PRN

## 2024-03-03 MED ORDER — SODIUM CHLORIDE 0.9% FLUSH: 10.0000 mL | INTRAVENOUS | Status: DC | PRN

## 2024-03-03 MED ORDER — WARFARIN SODIUM 5 MG PO TABS: 5.0000 mg | ORAL_TABLET | ORAL | Status: DC

## 2024-03-03 MED ORDER — WARFARIN SODIUM 10 MG PO TABS
10.0000 mg | ORAL_TABLET | ORAL | Status: DC
  Filled 2024-03-03: qty 1

## 2024-03-03 NOTE — Progress Notes (Signed)
 PHARMACY - ANTICOAGULATION CONSULT NOTE  Pharmacy Consult for warfarin Indication: atrial fibrillation  Allergies  Allergen Reactions   Ace Inhibitors Cough and Other (See Comments)    Patient Measurements: Height: 5\' 10"  (177.8 cm) Weight: 90.3 kg (199 lb 1.2 oz) IBW/kg (Calculated) : 73 HEPARIN  DW (KG): 90.3  Vital Signs: Temp: 98.1 F (36.7 C) (05/08 0500) Temp Source: Oral (05/08 0500) BP: 118/78 (05/08 0500) Pulse Rate: 71 (05/08 0500)  Labs: Recent Labs    03/01/24 1116 03/02/24 0947 03/02/24 0947 03/02/24 1147 03/02/24 1423 03/02/24 1552 03/02/24 1742 03/03/24 0602  HGB  --  12.2*  --   --   --   --   --   --   HCT  --  34.7*  --   --   --   --   --   --   PLT  --  267  --   --   --   --   --   --   LABPROT  --   --   --   --   --  26.1*  --  27.2*  INR 3.0  --   --   --   --  2.4*  --  2.5*  CREATININE  --  7.24*  --   --   --   --   --   --   TROPONINIHS  --  66*   < > 63* 68*  --  68*  --    < > = values in this interval not displayed.    Estimated Creatinine Clearance: 12.7 mL/min (A) (by C-G formula based on SCr of 7.24 mg/dL (H)).   Medical History: Past Medical History:  Diagnosis Date   Anemia    Aortic atherosclerosis (HCC)    Arthritis    Ascending aorta dilation (HCC) 02/17/2023   Limited TTE 02/17/23: no effusion, s/p MV repair w trivial residual MR, asc aorta 39 mm, mod TR, severe LAE   Asthma    Cancer (HCC)    renal cyst   Chronic diastolic CHF (congestive heart failure) (HCC) 01/06/2017   Echo 7/16 [facility] in [place], [place]) Mild AI, mild LAE, mild concentric LVH, EF 55, normal wall motion, mild to moderate MR, mild PI, RVSP 55 // Echo 10/09/16 ([facility]):  Moderate LVH, grade 2 diastolic dysfunction, mild MR, moderate LAE    Dysrhythmia    paroxysmal afib/flutter   Esophagitis    ESRD (end stage renal disease) on dialysis [facility])    Dialysis Mon Wed Fri   GERD (gastroesophageal reflux disease)    Gout    no current problems    Heart murmur    "had it since he was a baby"- pt had echo 10/01/23   Hepatitis    Hep B   HIV (human immunodeficiency virus infection) (HCC)    HLD (hyperlipidemia)    Hypertension    Hypothyroidism    Mitral regurgitation    Myocardial infarction (HCC)    Neuropathy    Sleep apnea    does not use c-pap   Stroke [facility])    Wears glasses    Wears partial dentures     Assessment: Patient admitted with CP and SOB, trop mildly elevated. On warfarin PTA for Afib and MVR. Warfarin PTA 5mg  on MWF and 10mg  all other days. HgB 12.2 and PLTS 267. Last dose of warfarin 5/6 AM.   Goal of Therapy:  INR 2-3 Monitor platelets by anticoagulation protocol: Yes  Plan:  INR therapeutic today at 2.5  Give home dose of warfarin 10 mg.  Daily INR checks.   Marleta Simmer BS, PharmD, BCPS Clinical Pharmacist 03/03/2024 7:39 AM  Contact: [phone] after 3 PM  "Be curious, not judgmental..." -Rumalda Counter

## 2024-03-03 NOTE — Progress Notes (Addendum)
 Patient Name: Albert Eaton Date of Encounter: 03/03/2024 [facility] Cardiologist: Arnoldo Lapping, MD   Interval Summary  .    Continues to have chest pain. Has shortness of breath intermittently that is not associated with exertion. The patient feels like the shortness of breath and chest pain may be panic attacks from anxiety.  Troponin trend is flat.  No c/w ACS    D-dimer is negative    Vital Signs .    Vitals:   03/02/24 1300 03/02/24 1340 03/02/24 2028 03/03/24 0500  BP: 112/81  (!) 143/90 118/78  Pulse: 64  77 71  Resp: 18  16 17   Temp:  98.1 F (36.7 C) 98.2 F (36.8 C) 98.1 F (36.7 C)  TempSrc:  Oral  Oral  SpO2: 97%  96% 97%  Weight:      Height:       No intake or output data in the 24 hours ending 03/03/24 0755    03/02/2024    9:08 AM 02/23/2024    8:57 AM 01/20/2024    5:48 AM  Last 3 Weights  Weight (lbs) 199 lb 1.2 oz 202 lb 4.8 oz 200 lb  Weight (kg) 90.3 kg 91.763 kg 90.719 kg      Telemetry/ECG    Normal sinus rhythm - Personally Reviewed  Physical Exam .   GEN: No acute distress.   Neck: No JVD Cardiac: RRR, a 2/6 systolic murmur that radiates to the left axilla Respiratory: Clear to auscultation bilaterally. GI: Soft, nontender, non-distended  MS: No edema  Assessment & Plan .     Albert Eaton is a 58 y.o. male with a hx of CAD, HFmrEF, Paroxysmal atrial fibrillation, mitral regugitation s/p MV repair on 07/2022, Tricuspid regurgitation, Hx or PEA arrest in 08/2022, end stage renal disease s/p right internal jugular Tunnel dialysis catheter and left arm AV fistula resection, HTN, HLD, aortic atherosclerosis, OSA, HIV, Hep B, Renal CA s/p L nephrectomy who is being seen 03/02/2024 for the evaluation of Chest pain at the request of Deatra Face MD.   Chest pain Shortness of breath Elevated troponin's Nonobstructive CAD History of cardiac arrest in 08/2022 Has shortness of breath that started last night and chest  tightness that started at dialysis. Is continuing to have chest discomfort and shortness of breath. Shortness of breath is worse with exertion. Has had Tonna Frederic but this feels different to that.  -- LHC on 08/2022 showed nonobstructive CAD with 50% proximal and mid LAD stenosis, 30% in second OM, and 40% stenosis in proximal RCA. -- high sensitivity troponin 66 > 63 (elevated and flat). EKG showed normal sinus rhythm with a rate of 80 with a prolonged QTCB of 548. Is normotensive. Chest x-ray showed no pulmonary infiltrates or pleural effusion. -- elevated and flat troponins are likely secondary to demand ischemia and ESRD as they are similar to prior troponins. -- Warfarin was recently subtherapeutic on 12/2023. But negative D-dimer and rules out PE. -- echo pending. -- Assuming there are no changes on echo is stable from cardiac perspective and no further work up for chest pain or shortness of breath is necessary at this time.     ESRD on dialysis Hypokalemia Hypochloremia -- Initial potassium is 2.8. Initial Cl 94 -- Management per nephrology     Paroxysmal atrial fibrillation -- In NSR on telemetry with frequent ectopy (PAC's and PVC's). -- continue warfarin. -- continue lopressor  25mg  daily     Mitral regugitation s/p MV  repair on 07/2022 Tricuspid regurgitation -- echo pending     HTN HFmrEF Is currently normotensive.3 Chest x-ray showed no pulmonary infiltrates or pleural effusion. Is euvolemic on exam. Most recent echo showed LVEF of 55%  -- Echo pending Continue home amlodipine , and lopressor  25mg  daily   Hyperlipidemia -- continue Crestor  10mg  daily   Other conditions managed per primary  For questions or updates, please contact [facility] Please consult [email or web address] for contact info under        Signed, Melita Springer, PA-C    Attending Note:   The patient was seen and examined.  Agree with assessment and plan as noted above.  Changes made to the above  note as needed.  Patient seen and independently examined with Zane Adams, PA .   We discussed all aspects of the encounter. I agree with the assessment and plan as stated above.    Chest pain :  atypical ,  troponin trend is not c/w ACS .  Likely due to demand ischemia in the setting of ESRD  Had an unremarkable cath 2 years ago   Echo to be done at 3 pm As long as the echo looks ok, I dont think there is any further cardiac evaluation needed.       I have spent a total of 40 minutes with patient reviewing hospital  notes , telemetry, EKGs, labs and examining patient as well as establishing an assessment and plan that was discussed with the patient.  > 50% of time was spent in direct patient care.    [place], Marieta Shorten., MD, [facility] 03/03/2024, 2:02 PM [address]  [address] Office [phone] Pager [phone]

## 2024-03-03 NOTE — Plan of Care (Signed)

## 2024-03-03 NOTE — Discharge Summary (Signed)
 Physician Discharge Summary  Al Lullo Mumaw JXB:[id number] DOB: Apr 13, 1966 DOA: 03/02/2024  PCP: Dorothe Gaster, NP  Admit date: 03/02/2024 Discharge date: 03/03/2024  Admitted From: home Disposition:  home  Recommendations for Outpatient Follow-up:  Follow up with PCP in 1-2 weeks  Home Health: none Equipment/Devices: none  Discharge Condition: stable CODE STATUS: Full code Diet Orders (From admission, onward)     Start     Ordered   03/03/24 0457  Diet renal with fluid restriction Fluid restriction: 1200 mL Fluid; Room service appropriate? Yes; Fluid consistency: Thin  Diet effective now       Question Answer Comment  Fluid restriction: 1200 mL Fluid   Room service appropriate? Yes   Fluid consistency: Thin      03/03/24 0456            [id number] D Ragon is a 58 y.o. male with medical history significant of CAD, HFmrEF, paroxysmal atrial fibrillation, mitral regugitation s/p MV repair on 07/2022, tricuspid regurgitation, s/p PEA arrest in 08/2022, ESRD s/p R internal jugular tunnled dialysis catheter and left arm AV fistula resection, HTN, HLD, aortic atherosclerosis, OSA, HIV, Hep B, Renal CA s/p L nephrectomy,and tobacco use p/w chest pain and occasional shortness of breath.  Hospital Course / Discharge diagnoses: Principal problem Chest pain, history of nonobstructive CAD, elevated troponin -patient was admitted to the hospital with chest pain.  Cardiology consulted and followed patient while hospitalized.  He has slight troponin elevation, however flat, not in a pattern consistent with ACS, likely demand ischemia, and also in the setting of underlying end-stage renal disease and persistent mild troponin elevation.  He underwent a 2D echogram which showed EF 50-55%, no WMA.  Discussed with cardiology, symptoms appear noncardiac in nature, he will be discharged home in stable condition.  PE is unlikely with negative D-dimer as well as anticoagulation with Coumadin   Active  problems ESRD-underwent dialysis the day prior to admission, will continue dialysis as an outpatient Hypokalemia -potassium replenished cautiously prior to discharge, will have this monitored with dialysis as an outpatient PAF-continue home medications including Coumadin  MV regurgitation status post MV repair October 2023 -2D echocardiogram as above Chronic diastolic CHF-euvolemic on exam, no evidence of fluid overload Essential hypertension-continue medications Hyperlipidemia-continue home medications  Sepsis ruled out   Discharge Instructions   Allergies as of 03/03/2024       Reactions   Ace Inhibitors Cough, Other (See Comments)        Medication List     STOP taking these medications    Eliquis  5 MG Tabs tablet Generic drug: apixaban        TAKE these medications    allopurinol  100 MG tablet Commonly known as: ZYLOPRIM  Take 1 tablet (100 mg total) by mouth daily.   amiodarone  200 MG tablet Commonly known as: PACERONE  Take 200 mg by mouth daily.   amLODipine  2.5 MG tablet Commonly known as: NORVASC  Take 1 tablet (2.5 mg total) by mouth daily.   azelastine  0.1 % nasal spray Commonly known as: ASTELIN  Place 2 sprays into both nostrils 2 (two) times daily as needed for allergies.   Biktarvy  50-200-25 MG Tabs tablet Generic drug: bictegravir-emtricitabine -tenofovir  AF Take 1 tablet by mouth daily.   cetirizine  10 MG tablet Commonly known as: ZYRTEC  TAKE ONE TABLET BY MOUTH DAILY AT 9 a.m.   cinacalcet  90 MG tablet Commonly known as: SENSIPAR  Take 90 mg by mouth daily.   DIALYVITE  800/ZINC  PO Take 1 tablet by mouth daily.   diclofenac   Sodium 1 % Gel Commonly known as: VOLTAREN  Apply 2 g topically 4 (four) times daily. Apply to chest pain when lidocaine  patch not in place What changed:  when to take this reasons to take this additional instructions   ezetimibe  10 MG tablet Commonly known as: ZETIA  Take 10 mg by mouth daily.   famotidine  20 MG  tablet Commonly known as: PEPCID  Take 1 tablet (20 mg total) by mouth 2 (two) times daily.   ferric citrate  1 GM 210 MG(Fe) tablet Commonly known as: AURYXIA  Take 420 mg by mouth See admin instructions. Take 420 mg by mouth three times a day with meals and 210 mg with each snack   Flovent  HFA 110 MCG/ACT inhaler Generic drug: fluticasone  Inhale 2 puffs into the lungs 2 (two) times daily as needed ("for flares").   gabapentin  300 MG capsule Commonly known as: NEURONTIN  Take 1 capsule (300 mg total) by mouth daily as needed (pain/neuropathy).   HYDROcodone -acetaminophen  5-325 MG tablet Commonly known as: NORCO/VICODIN Take 1 tablet by mouth every 6 (six) hours as needed.   ipratropium 0.06 % nasal spray Commonly known as: ATROVENT  USE TWO SPRAYS IN EACH NOSTRIL 2-3 times daily AS NEEDED   iron  sucrose 20 MG/ML injection Commonly known as: VENOFER  Inject into the vein 3 (three) times a week.   lactulose  10 GM/15ML solution Commonly known as: CHRONULAC  Take 45 mLs (30 g total) by mouth daily as needed. Take 15-30 ml up to twice daily for constipation What changed:  how much to take additional instructions   levalbuterol  45 MCG/ACT inhaler Commonly known as: XOPENEX  HFA Inhale 2 puffs into the lungs every 4 (four) hours as needed for wheezing.   levothyroxine  25 MCG tablet Commonly known as: SYNTHROID  Take 1 tablet (25 mcg total) by mouth daily before breakfast.   lubiprostone  24 MCG capsule Commonly known as: AMITIZA  Take 1 capsule (24 mcg total) by mouth 2 (two) times daily with a meal. NEEDS APPT FOR FURTHER REFILLS   metoprolol  tartrate 25 MG tablet Commonly known as: LOPRESSOR  Take 1 tablet (25 mg total) by mouth 2 (two) times daily.   MIRCERA IJ every 30 (thirty) days.   pantoprazole  40 MG tablet Commonly known as: PROTONIX  Take 1 tablet (40 mg total) by mouth daily.   Pataday  0.7 % Soln Generic drug: Olopatadine  HCl Place 1 drop into both eyes 2 (two)  times daily as needed (allergies).   rOPINIRole  1 MG tablet Commonly known as: REQUIP  Take 1 tablet (1 mg total) by mouth at bedtime.   rosuvastatin  10 MG tablet Commonly known as: CRESTOR  Take 1 tablet (10 mg total) by mouth daily.   sevelamer  carbonate 800 MG tablet Commonly known as: RENVELA  Take 2,400 mg by mouth daily.   Spacer/Aero-Holding Ismael Maria 1 Device by Does not apply route 2 (two) times a day.   Symbicort  160-4.5 MCG/ACT inhaler Generic drug: budesonide -formoterol  Inhale 2 puffs into the lungs in the morning and at bedtime. What changed:  when to take this reasons to take this additional instructions   tiZANidine  2 MG tablet Commonly known as: ZANAFLEX  Take 1 tablet (2 mg total) by mouth 2 (two) times daily as needed for muscle spasms.   Vitamin D  (Ergocalciferol ) 1.25 MG (50000 UNIT) Caps capsule Commonly known as: DRISDOL  Take 1 capsule (50,000 Units total) by mouth every Tuesday.   warfarin 5 MG tablet Commonly known as: COUMADIN  Take as directed. If you are unsure how to take this medication, talk to your nurse or  doctor. Original instructions: Take 1 tablet to 2 tablets by mouth daily as prescribed by coumadin  clinic What changed:  how much to take how to take this when to take this additional instructions       Consultations: Cardiology   Procedures/Studies:  [facility] Chest Port 1 View Result Date: 03/02/2024 CLINICAL DATA:  Chest pain. EXAM: PORTABLE CHEST 1 VIEW COMPARISON:  01/20/2024. FINDINGS: Bilateral lung fields are clear. Bilateral costophrenic angles are clear. Stable cardio-mediastinal silhouette. Mitral annuloplasty noted. No acute osseous abnormalities. The soft tissues are within normal limits. Right IJ hemodialysis catheter again seen with its tip overlying the cavoatrial junction region. Neurostimulator device battery pack noted overlying the right lateral middle chest wall, with lead extending superiorly towards the right neck.  IMPRESSION: No active disease. Electronically Signed   By: Beula Brunswick M.D.   On: 03/02/2024 10:11     Subjective: - no chest pain, shortness of breath, no abdominal pain, nausea or vomiting.   Discharge Exam: BP 114/79 (BP Location: Right Arm)   Pulse 75   Temp 98 F (36.7 C) (Oral)   Resp (!) 24   Ht 5\' 10"  (1.778 m)   Wt 90.3 kg   SpO2 96%   BMI 28.56 kg/m   General: Pt is alert, awake, not in acute distress Cardiovascular: RRR, S1/S2 +, no rubs, no gallops Respiratory: CTA bilaterally, no wheezing, no rhonchi Abdominal: Soft, NT, ND, bowel sounds + Extremities: no edema, no cyanosis    The results of significant diagnostics from this hospitalization (including imaging, microbiology, ancillary and laboratory) are listed below for reference.     Microbiology: Recent Results (from the past 240 hours)  MRSA Next Gen by PCR, Nasal     Status: None   Collection Time: 03/03/24  8:22 AM   Specimen: Nasal Mucosa; Nasal Swab  Result Value Ref Range Status   MRSA by PCR Next Gen NOT DETECTED NOT DETECTED Final    Comment: (NOTE) The GeneXpert MRSA Assay (FDA approved for NASAL specimens only), is one component of a comprehensive MRSA colonization surveillance program. It is not intended to diagnose MRSA infection nor to guide or monitor treatment for MRSA infections. Test performance is not FDA approved in patients less than 66 years old. Performed at [facility], [address] [place], [place] [address]      Labs: Basic Metabolic Panel: Recent Labs  Lab 03/02/24 0947 03/03/24 0938  NA 138 133*  K 2.8* 2.9*  CL 94* 95*  CO2 26 24  GLUCOSE 88 165*  BUN 23* 37*  CREATININE 7.24* 10.05*  CALCIUM  10.1 8.7*  PHOS  --  4.3   Liver Function Tests: Recent Labs  Lab 03/03/24 0938  ALBUMIN  3.6   CBC: Recent Labs  Lab 03/02/24 0947  WBC 6.1  HGB 12.2*  HCT 34.7*  MCV 94.8  PLT 267   CBG: No results for input(s): "GLUCAP" in the last 168  hours. Hgb A1c No results for input(s): "HGBA1C" in the last 72 hours. Lipid Profile No results for input(s): "CHOL", "HDL", "LDLCALC", "TRIG", "CHOLHDL", "LDLDIRECT" in the last 72 hours. Thyroid  function studies No results for input(s): "TSH", "T4TOTAL", "T3FREE", "THYROIDAB" in the last 72 hours.  Invalid input(s): "FREET3" Urinalysis    Component Value Date/Time   COLORURINE YELLOW 10/10/2021 1712   APPEARANCEUR CLEAR 10/10/2021 1712   LABSPEC >1.030 (H) 10/10/2021 1712   PHURINE 5.5 10/10/2021 1712   GLUCOSEU NEGATIVE 10/10/2021 1712   HGBUR SMALL (A) 10/10/2021  1712   BILIRUBINUR NEGATIVE 10/10/2021 1712   BILIRUBINUR neg 12/27/2019 0913   KETONESUR NEGATIVE 10/10/2021 1712   PROTEINUR >300 (A) 10/10/2021 1712   UROBILINOGEN 0.2 12/27/2019 0913   UROBILINOGEN 0.2 01/15/2018 1140   NITRITE NEGATIVE 10/10/2021 1712   LEUKOCYTESUR TRACE (A) 10/10/2021 1712    FURTHER DISCHARGE INSTRUCTIONS:   Get Medicines reviewed and adjusted: Please take all your medications with you for your next visit with your Primary MD   Laboratory/radiological data: Please request your Primary MD to go over all hospital tests and procedure/radiological results at the follow up, please ask your Primary MD to get all Hospital records sent to his/her office.   In some cases, they will be blood work, cultures and biopsy results pending at the time of your discharge. Please request that your primary care M.D. goes through all the records of your hospital data and follows up on these results.   Also Note the following: If you experience worsening of your admission symptoms, develop shortness of breath, life threatening emergency, suicidal or homicidal thoughts you must seek medical attention immediately by calling 911 or calling your MD immediately  if symptoms less severe.   You must read complete instructions/literature along with all the possible adverse reactions/side effects for all the Medicines  you take and that have been prescribed to you. Take any new Medicines after you have completely understood and accpet all the possible adverse reactions/side effects.    Do not drive when taking Pain medications or sleeping medications (Benzodaizepines)   Do not take more than prescribed Pain, Sleep and Anxiety Medications. It is not advisable to combine anxiety,sleep and pain medications without talking with your primary care practitioner   Special Instructions: If you have smoked or chewed Tobacco  in the last 2 yrs please stop smoking, stop any regular Alcohol  and or any Recreational drug use.   Wear Seat belts while driving.   Please note: You were cared for by a hospitalist during your hospital stay. Once you are discharged, your primary care physician will handle any further medical issues. Please note that NO REFILLS for any discharge medications will be authorized once you are discharged, as it is imperative that you return to your primary care physician (or establish a relationship with a primary care physician if you do not have one) for your post hospital discharge needs so that they can reassess your need for medications and monitor your lab values.  Time coordinating discharge: 35 minutes  SIGNED:  Kathlen Para, MD, PhD 03/03/2024, 1:33 PM

## 2024-03-03 NOTE — Plan of Care (Signed)
  Problem: Education: Goal: Knowledge of disease and its progression will improve 03/03/2024 0425 by Ulice Gamer, RN Outcome: Progressing 03/03/2024 0424 by Ulice Gamer, RN Outcome: Progressing Goal: Individualized Educational Video(s) 03/03/2024 0425 by Ulice Gamer, RN Outcome: Progressing 03/03/2024 0424 by Ulice Gamer, RN Outcome: Progressing   Problem: Fluid Volume: Goal: Compliance with measures to maintain balanced fluid volume will improve 03/03/2024 0425 by Ulice Gamer, RN Outcome: Progressing 03/03/2024 0424 by Ulice Gamer, RN Outcome: Progressing   Problem: Health Behavior/Discharge Planning: Goal: Ability to manage health-related needs will improve 03/03/2024 0425 by Ulice Gamer, RN Outcome: Progressing 03/03/2024 0424 by Ulice Gamer, RN Outcome: Progressing   Problem: Nutritional: Goal: Ability to make healthy dietary choices will improve 03/03/2024 0425 by Ulice Gamer, RN Outcome: Progressing 03/03/2024 0424 by Ulice Gamer, RN Outcome: Progressing   Problem: Clinical Measurements: Goal: Complications related to the disease process, condition or treatment will be avoided or minimized 03/03/2024 0425 by Ulice Gamer, RN Outcome: Progressing 03/03/2024 0424 by Ulice Gamer, RN Outcome: Progressing

## 2024-03-03 NOTE — Care Management CC44 (Signed)
 Condition Code 44 Documentation Completed  Patient Details  Name: Albert Eaton MRN: [id number] Date of Birth: 1966-06-07   Condition Code 44 given:  Yes Patient signature on Condition Code 44 notice:  Yes Documentation of 2 MD's agreement:  Yes Code 44 added to claim:  Yes    Naoma Bacca, RN 03/03/2024, 6:23 PM

## 2024-03-03 NOTE — TOC CM/SW Note (Signed)
 Transition of Care [facility]) - Inpatient Brief Assessment   Patient Details  Name: Albert Eaton MRN: [id number] Date of Birth: [birth year]  Transition of Care [facility]) CM/SW Contact:    Tom-Johnson, Angelique Ken, RN Phone Number: 03/03/2024, 3:47 PM   Clinical Narrative:  Patient presented to the ED with Shortness Of Breath and Chest Pain. Patient has hx of Cardiac Arrest in 08/2022, MI/NSTEMI, CAD, HFmrEF, Paroxysmal A-Fib on Warfarin, Mitral Regurgitation s/p MV repair on 07/2022, Tricuspid regurgitation, ESRD on MWF schedule, HTN, HLD, Aortic Atherosclerosis, OSA, HIV, Hep B, Renal CA s/p L Nephrectomy. Cardiology following.  From home with niece, does not have children. Both parents and siblings supportive. Not employed, on disability. Able to drive self and drives to and from Outpatient HD. Does not have DME's at home. Currently on 2L O2, does not have home O2.    PCP is Dorothe Gaster, NP and uses [facility] on [address]   No TOC needs or recommendations noted at this time.  CM will continue to follow as patient progresses with care towards discharge.          Transition of Care Asessment: Insurance and Status: Insurance coverage has been reviewed Patient has primary care physician: Yes Home environment has been reviewed: Yes Prior level of function:: Independent Prior/Current Home Services: No current home services Social Drivers of Health Review: SDOH reviewed no interventions necessary Readmission risk has been reviewed: Yes Transition of care needs: no transition of care needs at this time

## 2024-03-03 NOTE — Progress Notes (Signed)
*  PRELIMINARY RESULTS* Echocardiogram 2D Echocardiogram has been performed.  Albert Eaton 03/03/2024, 3:26 PM

## 2024-03-03 NOTE — Care Management Obs Status (Signed)
 MEDICARE OBSERVATION STATUS NOTIFICATION   Patient Details  Name: Albert Eaton MRN: [id number] Date of Birth: [birth year]   Medicare Observation Status Notification Given:  Yes    Naoma Bacca, RN 03/03/2024, 6:23 PM

## 2024-03-03 NOTE — TOC Initial Note (Signed)
 Transition of Care [facility]) - Initial/Assessment Note    Patient Details  Name: Albert Eaton MRN: [id number] Date of Birth: June 02, 1966  Transition [facility]) CM/SW Contact:    Juliane Och, LCSW Phone Number: 03/03/2024, 3:14 PM  Clinical Narrative:                  3:14 PM CSW introduced self and role to patient at patient's bedside. Patient confirmed that niece has recently moved in with him. Patient stated nephew would provide transportation upon discharge. Patient stated that niece and nephew could provide home support if needed upon discharge. Patient denied SNF/HH history. Patient stated he has a walker at home that he did not use prior to current hospitalization. CSW followed up on SDOH needs (food) and offer resources. Patient declined CSW offer. Patient stated he receives food stamps and is aware of local food pantries.  Expected Discharge Plan: Home/Self Care Barriers to Discharge: Continued Medical Work up   Patient Goals and CMS Choice Patient states their goals for this hospitalization and ongoing recovery are:: to return home          Expected Discharge Plan and Services       Living arrangements for the past 2 months: Apartment                                      Prior Living Arrangements/Services Living arrangements for the past 2 months: Apartment Lives with:: Relatives Patient language and need for interpreter reviewed:: Yes Do you feel safe going back to the place where you live?: Yes      Need for Family Participation in Patient Care: No (Comment) Care giver support system in place?: Yes (comment)   Criminal Activity/Legal Involvement Pertinent to Current Situation/Hospitalization: No - Comment as needed  Activities of Daily Living      Permission Sought/Granted Permission sought to share information with : Family Supports Permission granted to share information with : No (Contact information on chart)  Share Information with NAME:  Keenan Pastor     Permission granted to share info w Relationship: Relative  Permission granted to share info w Contact Information: [phone]  Emotional Assessment Appearance:: Appears stated age Attitude/Demeanor/Rapport: Engaged Affect (typically observed): Accepting, Appropriate, Adaptable, Calm, Stable, Pleasant Orientation: : Oriented to Self, Oriented to Place, Oriented to  Time, Oriented to Situation Alcohol / Substance Use: Not Applicable Psych Involvement: No (comment)  Admission diagnosis:  Angina pectoris (HCC) [I20.9] Acute hypoxemic respiratory failure (HCC) [J96.01] Patient Active Problem List   Diagnosis Date Noted   Acute hypoxemic respiratory failure (HCC) 03/02/2024   History of pneumonia 12/31/2023   Tachycardia 12/31/2023   Long term (current) use of anticoagulants 11/05/2023   ESRD (end stage renal disease) (HCC) 10/29/2023   Atrial flutter, unspecified type (HCC) 10/29/2023   Precordial pain 02/17/2023   Heart failure with mildly reduced ejection fraction (HFmrEF) (HCC) 02/17/2023   Hypotension 02/17/2023   Ascending aorta dilation (HCC) 02/17/2023   CAD (coronary artery disease) 02/16/2023   Chronic viral hepatitis B without delta-agent (HCC) 01/06/2023   Cancer screening 01/06/2023   Vitamin D  deficiency 01/01/2023   Acute cough 11/25/2022   Influenza A 11/25/2022   Other headache syndrome 11/25/2022   Multiple fractures of ribs, bilateral, initial encounter for closed fracture 09/16/2022   Cardiac arrest (HCC) 09/08/2022   Acute cerebrovascular insufficiency 08/28/2022   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 08/28/2022   Cerebrovascular accident (CVA) due to embolism of left cerebellar artery (HCC)    S/P MVR (mitral valve repair) 08/21/2022   Chronic pain of right ankle 06/26/2022   Autonomic neuropathy in diseases classified elsewhere 04/21/2022   PAF (paroxysmal atrial fibrillation) (HCC) 12/02/2021   Laryngopharyngeal  reflux (LPR) 11/11/2021   Chest wall pain    Elevated troponin    NSTEMI (non-ST elevated myocardial infarction) (HCC) 09/03/2021   Essential hypertension 09/03/2021   Renal cell carcinoma (HCC) 06/27/2021   Generalized (acute) peritonitis (HCC) 06/24/2021   Other disorders of bilirubin metabolism 06/24/2021   Renal mass 02/18/2021   Pain, unspecified 02/08/2021   SVT (supraventricular tachycardia) (HCC) 01/18/2021   Lower abdominal pain 06/26/2020   Contact with and (suspected) exposure to tuberculosis 04/02/2020   Fluid overload, unspecified 04/02/2020   Pulsatile tinnitus of right ear 11/21/2019   Kidney failure 11/21/2019   Acute diastolic CHF (congestive heart failure) (HCC) 11/21/2019   Fever and chills 11/07/2019   Hypercalcemia 10/17/2019   Dependence on renal dialysis (HCC) 06/06/2019   Coagulation defect, unspecified (HCC) 05/23/2019   Iron  deficiency anemia, unspecified 05/16/2019   Anemia in other chronic diseases classified elsewhere 05/13/2019   Arteriovenous fistula, acquired (HCC) 05/13/2019   Body mass index (BMI) 28.0-28.9, adult 05/13/2019   Crohn's disease of small intestine without complications (HCC) 05/13/2019   Encounter for general adult medical examination with abnormal findings 05/13/2019   Gout, unspecified 05/13/2019   Nausea 05/13/2019   Other fatigue 05/13/2019   Secondary hyperparathyroidism of renal origin (HCC) 05/13/2019   ESRD (end stage renal disease) on dialysis (HCC) 05/13/2019   Chronic diastolic (congestive) heart failure (HCC) 05/13/2019   Chronic kidney disease, unspecified 05/13/2019   Obstructive sleep apnea 05/13/2019   Discomfort of right ear 02/18/2019   Hypokalemia 06/13/2018   Abnormal CT scan, small bowel    Perennial allergic rhinitis with a predominant nonallergic component 02/09/2018   Allergic conjunctivitis 02/09/2018   Mild intermittent asthma 02/09/2018   Atherosclerosis 12/09/2017   Hypothyroidism 11/19/2017   Mixed  hyperlipidemia 10/13/2017   Personal history of gout 10/13/2017   S/P cholecystectomy 10/13/2017   Congestive heart failure with LV diastolic dysfunction, NYHA class 1 (HCC) 10/13/2017   Hypothyroidism (acquired) 10/13/2017   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/31/2017   SOB (shortness of breath) 06/07/2017   Arthritis, gouty 01/27/2017   Chronic diastolic CHF (congestive heart failure) (HCC) 01/06/2017   OSA (obstructive sleep apnea) 01/06/2017   Abnormal electrocardiogram (ECG) (EKG) 10/31/2016   Pleuritic chest pain 10/09/2016   HIV disease (HCC) 10/09/2016   Hypertensive heart disease with CHF (congestive heart failure) (HCC) 10/09/2016   Lumbar back pain 05/29/2015   NYHA class 2 heart failure with preserved ejection fraction (HCC) 05/15/2015   PAH (pulmonary artery hypertension) (HCC) 05/08/2015   Severe mitral regurgitation 05/04/2015   Prolonged Q-T interval on ECG 05/04/2015   PCP:  Dorothe Gaster, NP Pharmacy:   [id number] [place], [place] - [address] AT [facility] [address] [place] [address] Phone: [phone] Fax: [phone]  [facility] [place] - [address] [place] [address] Phone: [phone] Fax: [phone]  [facility] - [place], [place] - [address] [place] [address] Phone: [phone] Fax: [phone]     Social Drivers of Health (SDOH) Social  History: SDOH Screenings   Food Insecurity: Food Insecurity Present (03/03/2024)  Housing: Low Risk  (03/03/2024)  Transportation Needs: No Transportation Needs (03/03/2024)  Utilities: Not At Risk (03/03/2024)  Alcohol Screen: Low Risk  (07/13/2023)  Depression (PHQ2-9): Medium Risk (12/31/2023)  Financial Resource Strain: High Risk (07/13/2023)  Physical Activity: Inactive (07/13/2023)  Social Connections: Socially Isolated (07/13/2023)  Stress: No Stress  Concern Present (07/13/2023)  Tobacco Use: Medium Risk (03/02/2024)  Health Literacy: Adequate Health Literacy (07/13/2023)   SDOH Interventions: Food Insecurity Interventions: Other (Comment) (Has food stamps and is aware of food pantries)   Readmission Risk Interventions    09/10/2022    2:51 PM 08/22/2022   11:54 AM 09/05/2021   11:20 AM  Readmission Risk Prevention Plan  Transportation Screening Complete Complete Complete  Medication Review Oceanographer) Complete Complete Complete  PCP or Specialist appointment within 3-5 days of discharge   Complete  HRI or Home Care Consult Complete Complete Complete  SW Recovery Care/Counseling Consult Complete Complete Complete  Palliative Care Screening Not Applicable Not Applicable Not Applicable  Skilled Nursing Facility Complete Not Applicable Not Applicable

## 2024-03-04 DIAGNOSIS — D509 Iron deficiency anemia, unspecified: Secondary | ICD-10-CM | POA: Diagnosis not present

## 2024-03-04 DIAGNOSIS — D631 Anemia in chronic kidney disease: Secondary | ICD-10-CM | POA: Diagnosis not present

## 2024-03-04 DIAGNOSIS — E876 Hypokalemia: Secondary | ICD-10-CM | POA: Diagnosis not present

## 2024-03-04 DIAGNOSIS — D689 Coagulation defect, unspecified: Secondary | ICD-10-CM | POA: Diagnosis not present

## 2024-03-04 DIAGNOSIS — N2581 Secondary hyperparathyroidism of renal origin: Secondary | ICD-10-CM | POA: Diagnosis not present

## 2024-03-04 DIAGNOSIS — T8249XD Other complication of vascular dialysis catheter, subsequent encounter: Secondary | ICD-10-CM | POA: Diagnosis not present

## 2024-03-04 DIAGNOSIS — N186 End stage renal disease: Secondary | ICD-10-CM | POA: Diagnosis not present

## 2024-03-04 DIAGNOSIS — Z992 Dependence on renal dialysis: Secondary | ICD-10-CM | POA: Diagnosis not present

## 2024-03-07 DIAGNOSIS — D689 Coagulation defect, unspecified: Secondary | ICD-10-CM | POA: Diagnosis not present

## 2024-03-07 DIAGNOSIS — N2581 Secondary hyperparathyroidism of renal origin: Secondary | ICD-10-CM | POA: Diagnosis not present

## 2024-03-07 DIAGNOSIS — Z992 Dependence on renal dialysis: Secondary | ICD-10-CM | POA: Diagnosis not present

## 2024-03-07 DIAGNOSIS — D509 Iron deficiency anemia, unspecified: Secondary | ICD-10-CM | POA: Diagnosis not present

## 2024-03-07 DIAGNOSIS — N186 End stage renal disease: Secondary | ICD-10-CM | POA: Diagnosis not present

## 2024-03-07 DIAGNOSIS — T8249XD Other complication of vascular dialysis catheter, subsequent encounter: Secondary | ICD-10-CM | POA: Diagnosis not present

## 2024-03-07 DIAGNOSIS — E876 Hypokalemia: Secondary | ICD-10-CM | POA: Diagnosis not present

## 2024-03-07 DIAGNOSIS — D631 Anemia in chronic kidney disease: Secondary | ICD-10-CM | POA: Diagnosis not present

## 2024-03-09 DIAGNOSIS — E876 Hypokalemia: Secondary | ICD-10-CM | POA: Diagnosis not present

## 2024-03-09 DIAGNOSIS — N2581 Secondary hyperparathyroidism of renal origin: Secondary | ICD-10-CM | POA: Diagnosis not present

## 2024-03-09 DIAGNOSIS — T8249XD Other complication of vascular dialysis catheter, subsequent encounter: Secondary | ICD-10-CM | POA: Diagnosis not present

## 2024-03-09 DIAGNOSIS — D631 Anemia in chronic kidney disease: Secondary | ICD-10-CM | POA: Diagnosis not present

## 2024-03-09 DIAGNOSIS — Z992 Dependence on renal dialysis: Secondary | ICD-10-CM | POA: Diagnosis not present

## 2024-03-09 DIAGNOSIS — D509 Iron deficiency anemia, unspecified: Secondary | ICD-10-CM | POA: Diagnosis not present

## 2024-03-09 DIAGNOSIS — N186 End stage renal disease: Secondary | ICD-10-CM | POA: Diagnosis not present

## 2024-03-09 DIAGNOSIS — D689 Coagulation defect, unspecified: Secondary | ICD-10-CM | POA: Diagnosis not present

## 2024-03-10 ENCOUNTER — Other Ambulatory Visit: Payer: Self-pay | Admitting: Allergy & Immunology

## 2024-03-10 ENCOUNTER — Other Ambulatory Visit: Payer: Self-pay | Admitting: Nurse Practitioner

## 2024-03-10 DIAGNOSIS — N186 End stage renal disease: Secondary | ICD-10-CM | POA: Diagnosis not present

## 2024-03-10 DIAGNOSIS — Z992 Dependence on renal dialysis: Secondary | ICD-10-CM | POA: Diagnosis not present

## 2024-03-11 DIAGNOSIS — N2581 Secondary hyperparathyroidism of renal origin: Secondary | ICD-10-CM | POA: Diagnosis not present

## 2024-03-11 DIAGNOSIS — D631 Anemia in chronic kidney disease: Secondary | ICD-10-CM | POA: Diagnosis not present

## 2024-03-11 DIAGNOSIS — E876 Hypokalemia: Secondary | ICD-10-CM | POA: Diagnosis not present

## 2024-03-11 DIAGNOSIS — Z992 Dependence on renal dialysis: Secondary | ICD-10-CM | POA: Diagnosis not present

## 2024-03-11 DIAGNOSIS — D689 Coagulation defect, unspecified: Secondary | ICD-10-CM | POA: Diagnosis not present

## 2024-03-11 DIAGNOSIS — N186 End stage renal disease: Secondary | ICD-10-CM | POA: Diagnosis not present

## 2024-03-11 DIAGNOSIS — D509 Iron deficiency anemia, unspecified: Secondary | ICD-10-CM | POA: Diagnosis not present

## 2024-03-11 DIAGNOSIS — T8249XD Other complication of vascular dialysis catheter, subsequent encounter: Secondary | ICD-10-CM | POA: Diagnosis not present

## 2024-03-15 ENCOUNTER — Other Ambulatory Visit: Payer: Self-pay | Admitting: Cardiovascular Disease

## 2024-03-15 DIAGNOSIS — M9903 Segmental and somatic dysfunction of lumbar region: Secondary | ICD-10-CM | POA: Diagnosis not present

## 2024-03-15 DIAGNOSIS — M5136 Other intervertebral disc degeneration, lumbar region with discogenic back pain only: Secondary | ICD-10-CM | POA: Diagnosis not present

## 2024-03-15 DIAGNOSIS — M9904 Segmental and somatic dysfunction of sacral region: Secondary | ICD-10-CM | POA: Diagnosis not present

## 2024-03-15 DIAGNOSIS — M9905 Segmental and somatic dysfunction of pelvic region: Secondary | ICD-10-CM | POA: Diagnosis not present

## 2024-03-16 ENCOUNTER — Encounter: Payer: Self-pay | Admitting: Pulmonary Disease

## 2024-03-16 ENCOUNTER — Ambulatory Visit: Admitting: Pulmonary Disease

## 2024-03-16 VITALS — BP 155/83 | HR 82 | Ht 70.0 in | Wt 202.0 lb

## 2024-03-16 DIAGNOSIS — G2581 Restless legs syndrome: Secondary | ICD-10-CM

## 2024-03-16 DIAGNOSIS — N2581 Secondary hyperparathyroidism of renal origin: Secondary | ICD-10-CM | POA: Diagnosis not present

## 2024-03-16 DIAGNOSIS — R9389 Abnormal findings on diagnostic imaging of other specified body structures: Secondary | ICD-10-CM

## 2024-03-16 DIAGNOSIS — Z992 Dependence on renal dialysis: Secondary | ICD-10-CM | POA: Diagnosis not present

## 2024-03-16 DIAGNOSIS — Z87891 Personal history of nicotine dependence: Secondary | ICD-10-CM

## 2024-03-16 DIAGNOSIS — E876 Hypokalemia: Secondary | ICD-10-CM | POA: Diagnosis not present

## 2024-03-16 DIAGNOSIS — D509 Iron deficiency anemia, unspecified: Secondary | ICD-10-CM | POA: Diagnosis not present

## 2024-03-16 DIAGNOSIS — N186 End stage renal disease: Secondary | ICD-10-CM | POA: Diagnosis not present

## 2024-03-16 DIAGNOSIS — G4733 Obstructive sleep apnea (adult) (pediatric): Secondary | ICD-10-CM | POA: Diagnosis not present

## 2024-03-16 DIAGNOSIS — D689 Coagulation defect, unspecified: Secondary | ICD-10-CM | POA: Diagnosis not present

## 2024-03-16 DIAGNOSIS — T8249XD Other complication of vascular dialysis catheter, subsequent encounter: Secondary | ICD-10-CM | POA: Diagnosis not present

## 2024-03-16 DIAGNOSIS — D631 Anemia in chronic kidney disease: Secondary | ICD-10-CM | POA: Diagnosis not present

## 2024-03-16 NOTE — Progress Notes (Signed)
 Subjective:    Patient ID: Albert Eaton, male    DOB: [birth year], 58 y.o.   MRN: [id number]  Most recent study showed moderate obstructive sleep apnea with moderate reduction in oxygen saturations  He has end-stage renal disease for which he is on hemodialysis   He cannot tolerate anything over his face, even regular mass make him feel like he is suffocating  Has not been able to tolerate CPAP in a long time  Was evaluated by Dr. Tellis Feathers Had implantation of inspire device 01/14/2024  He does have a history of insomnia still on Klonopin   Significant history of restless legs related to neuropathy  Neurontin  helps his neuropathy  He still has nonrestorative sleep, frequent awakenings, daytime sleepiness, mouth dryness, occasional headaches in the mornings History of heart disease, end-stage renal disease on dialysis  Describes shortness of breath with activity, his weight has remained stable  Stable and compliant on antiretrovirals  No acute respiratory tract infections Weight is stable Exercise tolerance is stable  He does have a 3.5 mm lung nodule noted 01/26/2024 CT -Will plan to repeat CT  Recently admitted 5/7-5/8 for chest pain   Past Medical History:  Diagnosis Date   Anemia    Aortic atherosclerosis (HCC)    Arthritis    Ascending aorta dilation (HCC) 02/17/2023   Limited TTE 02/17/23: no effusion, s/p MV repair w trivial residual MR, asc aorta 39 mm, mod TR, severe LAE   Asthma    Cancer (HCC)    renal cyst   Chronic diastolic CHF (congestive heart failure) (HCC) 01/06/2017   Echo 7/16 [facility] in [place], [place]) Mild AI, mild LAE, mild concentric LVH, EF 55, normal wall motion, mild to moderate MR, mild PI, RVSP 55 // Echo 10/09/16 ([facility]):  Moderate LVH, grade 2 diastolic dysfunction, mild MR, moderate LAE    Dysrhythmia    paroxysmal afib/flutter   Esophagitis    ESRD (end stage renal disease) on dialysis [facility])    Dialysis Mon Wed Fri   GERD  (gastroesophageal reflux disease)    Gout    no current problems   Heart murmur    "had it since he was a baby"- pt had echo 10/01/23   Hepatitis    Hep B   HIV (human immunodeficiency virus infection) (HCC)    HLD (hyperlipidemia)    Hypertension    Hypothyroidism    Mitral regurgitation    Myocardial infarction (HCC)    Neuropathy    Sleep apnea    does not use c-pap   Stroke [facility])    Wears glasses    Wears partial dentures    Family History  Problem Relation Age of Onset   Hypertension Mother    Heart failure Mother    Hypertension Father    Alcohol abuse Father    Hypertension Sister    Multiple sclerosis Sister    Hypertension Sister    Hypertension Brother    Heart attack Maternal Grandmother 87   Scoliosis Other    Allergic rhinitis Neg Hx    Angioedema Neg Hx    Asthma Neg Hx    Eczema Neg Hx    Immunodeficiency Neg Hx    Urticaria Neg Hx    Colon cancer Neg Hx    Pancreatic cancer Neg Hx    Esophageal cancer Neg Hx    Social History   Socioeconomic History   Marital status: Single    Spouse name: Not on file   Number  of children: 0   Years of education: Not on file   Highest education level: Not on file  Occupational History   Not on file  Tobacco Use   Smoking status: Former    Current packs/day: 0.00    Average packs/day: 1 pack/day for 18.0 years (18.0 ttl pk-yrs)    Types: Cigarettes    Start date: 61    Quit date: 2000    Years since quitting: 25.4   Smokeless tobacco: Never  Vaping Use   Vaping status: Never Used  Substance and Sexual Activity   Alcohol use: Yes    Comment: just special occasions/holidays   Drug use: No   Sexual activity: Not Currently    Partners: Male  Other Topics Concern   Not on file  Social History Narrative   Disability      [place]bie: none   Social Drivers of Health   Financial Resource Strain: High Risk (07/13/2023)   Overall Financial Resource Strain (CARDIA)    Difficulty of Paying Living Expenses:  Hard  Food Insecurity: Food Insecurity Present (03/03/2024)   Hunger Vital Sign    Worried About Running Out of Food in the Last Year: Often true    Ran Out of Food in the Last Year: Never true  Transportation Needs: No Transportation Needs (03/03/2024)   PRAPARE - Administrator, Civil Service (Medical): No    Lack of Transportation (Non-Medical): No  Physical Activity: Inactive (07/13/2023)   Exercise Vital Sign    Days of Exercise per Week: 0 days    Minutes of Exercise per Session: 0 min  Stress: No Stress Concern Present (07/13/2023)   [employer] of Occupational Health - Occupational Stress Questionnaire    Feeling of Stress : Not at all  Social Connections: Socially Isolated (07/13/2023)   Social Connection and Isolation Panel [NHANES]    Frequency of Communication with Friends and Family: More than three times a week    Frequency of Social Gatherings with Friends and Family: More than three times a week    Attends Religious Services: Never    Database administrator or Organizations: No    Attends Banker Meetings: Never    Marital Status: Never married  Intimate Partner Violence: Not At Risk (03/03/2024)   Humiliation, Afraid, Rape, and Kick questionnaire    Fear of Current or Ex-Partner: No    Emotionally Abused: No    Physically Abused: No    Sexually Abused: No     Review of Systems  Constitutional: Negative.   HENT:  Negative for congestion and sneezing.   Eyes: Negative.   Respiratory:  Positive for apnea.   Cardiovascular: Negative.   Gastrointestinal: Negative.   Genitourinary: Negative.   Psychiatric/Behavioral:  Positive for sleep disturbance.   All other systems reviewed and are negative.      Objective:   Physical Exam Constitutional:      Appearance: Normal appearance.  HENT:     Head: Normocephalic.     Nose: No congestion.     Mouth/Throat:     Comments: He does have macroglossia, Mallampati 3 Eyes:     General:         Right eye: No discharge.        Left eye: No discharge.     Pupils: Pupils are equal, round, and reactive to light.  Cardiovascular:     Rate and Rhythm: Normal rate and regular rhythm.     Pulses: Normal pulses.  Heart sounds: Normal heart sounds. No murmur heard.    No friction rub.  Pulmonary:     Effort: Pulmonary effort is normal. No respiratory distress.     Breath sounds: Normal breath sounds. No stridor. No wheezing or rhonchi.  Musculoskeletal:     Cervical back: No rigidity.  Neurological:     Mental Status: He is alert.  Psychiatric:        Mood and Affect: Mood normal.  Polysomnogram from 12/24/2017 reviewed revealing mild obstructive sleep apnea  Home sleep study from 11/12/2021 reviewed by myself showing moderate obstructive sleep apnea with AHI of 23.3, moderately severe oxygen desaturations  Compliance data is unavailable as he has not been able to tolerate CPAP  Inspire device implantation 01/14/2024  Device was interrogated in the office today and device activation was performed  CT chest revealing 3.5 mm nodule, does have a past history of renal cell cancer  Assessment & Plan:  .  Moderate obstructive sleep apnea -Struggle with CPAP for many years. - Had inspire device activation today  Sensation amplitude of 0.8 Functional amplitude of 1.0 Therapy duration of 8 hours, start delay of 1 hour, pause of 30 minutes Was very comfortable during the activation process  Sleep onset and sleep maintenance insomnia - As needed use of hypnotics  Restless leg -On Neurontin , on Requip  -Neurontin  does cause some grogginess  Past history of renal cell cancer  On dialysis 3 days a week for end-stage renal disease  Plan  Gradual weekly titration of his inspire device  Will follow-up in about 6 to 8 weeks  If stable at the time then follow-up sleep study can be scheduled  For his lung nodule we will repeat CT scan of the chest 3 months from  previous  Follow-up in 3 months

## 2024-03-16 NOTE — Patient Instructions (Addendum)
 Follow-up in about I will see you back in about 6 weeks  Escalate therapy as tolerated  We will repeat your CT scan of the chest to follow-up on the small lung nodule about July  Call us  with any significant concerns

## 2024-03-18 DIAGNOSIS — D631 Anemia in chronic kidney disease: Secondary | ICD-10-CM | POA: Diagnosis not present

## 2024-03-18 DIAGNOSIS — D689 Coagulation defect, unspecified: Secondary | ICD-10-CM | POA: Diagnosis not present

## 2024-03-18 DIAGNOSIS — N2581 Secondary hyperparathyroidism of renal origin: Secondary | ICD-10-CM | POA: Diagnosis not present

## 2024-03-18 DIAGNOSIS — D509 Iron deficiency anemia, unspecified: Secondary | ICD-10-CM | POA: Diagnosis not present

## 2024-03-18 DIAGNOSIS — Z992 Dependence on renal dialysis: Secondary | ICD-10-CM | POA: Diagnosis not present

## 2024-03-18 DIAGNOSIS — N186 End stage renal disease: Secondary | ICD-10-CM | POA: Diagnosis not present

## 2024-03-18 DIAGNOSIS — E876 Hypokalemia: Secondary | ICD-10-CM | POA: Diagnosis not present

## 2024-03-18 DIAGNOSIS — T8249XD Other complication of vascular dialysis catheter, subsequent encounter: Secondary | ICD-10-CM | POA: Diagnosis not present

## 2024-03-21 DIAGNOSIS — T8249XD Other complication of vascular dialysis catheter, subsequent encounter: Secondary | ICD-10-CM | POA: Diagnosis not present

## 2024-03-21 DIAGNOSIS — D631 Anemia in chronic kidney disease: Secondary | ICD-10-CM | POA: Diagnosis not present

## 2024-03-21 DIAGNOSIS — N186 End stage renal disease: Secondary | ICD-10-CM | POA: Diagnosis not present

## 2024-03-21 DIAGNOSIS — E876 Hypokalemia: Secondary | ICD-10-CM | POA: Diagnosis not present

## 2024-03-21 DIAGNOSIS — Z992 Dependence on renal dialysis: Secondary | ICD-10-CM | POA: Diagnosis not present

## 2024-03-21 DIAGNOSIS — D509 Iron deficiency anemia, unspecified: Secondary | ICD-10-CM | POA: Diagnosis not present

## 2024-03-21 DIAGNOSIS — N2581 Secondary hyperparathyroidism of renal origin: Secondary | ICD-10-CM | POA: Diagnosis not present

## 2024-03-21 DIAGNOSIS — D689 Coagulation defect, unspecified: Secondary | ICD-10-CM | POA: Diagnosis not present

## 2024-03-22 DIAGNOSIS — M9904 Segmental and somatic dysfunction of sacral region: Secondary | ICD-10-CM | POA: Diagnosis not present

## 2024-03-22 DIAGNOSIS — M5136 Other intervertebral disc degeneration, lumbar region with discogenic back pain only: Secondary | ICD-10-CM | POA: Diagnosis not present

## 2024-03-22 DIAGNOSIS — M9903 Segmental and somatic dysfunction of lumbar region: Secondary | ICD-10-CM | POA: Diagnosis not present

## 2024-03-22 DIAGNOSIS — M9905 Segmental and somatic dysfunction of pelvic region: Secondary | ICD-10-CM | POA: Diagnosis not present

## 2024-03-23 ENCOUNTER — Ambulatory Visit
Admission: RE | Admit: 2024-03-23 | Discharge: 2024-03-23 | Discharge status: Home / Self Care | Source: Ambulatory Visit | Attending: Pulmonary Disease

## 2024-03-23 DIAGNOSIS — D631 Anemia in chronic kidney disease: Secondary | ICD-10-CM | POA: Diagnosis not present

## 2024-03-23 DIAGNOSIS — I2510 Atherosclerotic heart disease of native coronary artery without angina pectoris: Secondary | ICD-10-CM | POA: Diagnosis not present

## 2024-03-23 DIAGNOSIS — R9389 Abnormal findings on diagnostic imaging of other specified body structures: Secondary | ICD-10-CM

## 2024-03-23 DIAGNOSIS — N186 End stage renal disease: Secondary | ICD-10-CM | POA: Diagnosis not present

## 2024-03-23 DIAGNOSIS — T8249XD Other complication of vascular dialysis catheter, subsequent encounter: Secondary | ICD-10-CM | POA: Diagnosis not present

## 2024-03-23 DIAGNOSIS — M8458XD Pathological fracture in neoplastic disease, other specified site, subsequent encounter for fracture with routine healing: Secondary | ICD-10-CM | POA: Diagnosis not present

## 2024-03-23 DIAGNOSIS — D509 Iron deficiency anemia, unspecified: Secondary | ICD-10-CM | POA: Diagnosis not present

## 2024-03-23 DIAGNOSIS — C649 Malignant neoplasm of unspecified kidney, except renal pelvis: Secondary | ICD-10-CM | POA: Diagnosis not present

## 2024-03-23 DIAGNOSIS — E876 Hypokalemia: Secondary | ICD-10-CM | POA: Diagnosis not present

## 2024-03-23 DIAGNOSIS — Z992 Dependence on renal dialysis: Secondary | ICD-10-CM | POA: Diagnosis not present

## 2024-03-23 DIAGNOSIS — R911 Solitary pulmonary nodule: Secondary | ICD-10-CM | POA: Diagnosis not present

## 2024-03-23 DIAGNOSIS — D689 Coagulation defect, unspecified: Secondary | ICD-10-CM | POA: Diagnosis not present

## 2024-03-23 DIAGNOSIS — N2581 Secondary hyperparathyroidism of renal origin: Secondary | ICD-10-CM | POA: Diagnosis not present

## 2024-03-25 DIAGNOSIS — E876 Hypokalemia: Secondary | ICD-10-CM | POA: Diagnosis not present

## 2024-03-25 DIAGNOSIS — D509 Iron deficiency anemia, unspecified: Secondary | ICD-10-CM | POA: Diagnosis not present

## 2024-03-25 DIAGNOSIS — D631 Anemia in chronic kidney disease: Secondary | ICD-10-CM | POA: Diagnosis not present

## 2024-03-25 DIAGNOSIS — N186 End stage renal disease: Secondary | ICD-10-CM | POA: Diagnosis not present

## 2024-03-25 DIAGNOSIS — D689 Coagulation defect, unspecified: Secondary | ICD-10-CM | POA: Diagnosis not present

## 2024-03-25 DIAGNOSIS — N2581 Secondary hyperparathyroidism of renal origin: Secondary | ICD-10-CM | POA: Diagnosis not present

## 2024-03-25 DIAGNOSIS — Z992 Dependence on renal dialysis: Secondary | ICD-10-CM | POA: Diagnosis not present

## 2024-03-25 DIAGNOSIS — T8249XD Other complication of vascular dialysis catheter, subsequent encounter: Secondary | ICD-10-CM | POA: Diagnosis not present

## 2024-03-26 DIAGNOSIS — I129 Hypertensive chronic kidney disease with stage 1 through stage 4 chronic kidney disease, or unspecified chronic kidney disease: Secondary | ICD-10-CM | POA: Diagnosis not present

## 2024-03-26 DIAGNOSIS — N186 End stage renal disease: Secondary | ICD-10-CM | POA: Diagnosis not present

## 2024-03-26 DIAGNOSIS — Z992 Dependence on renal dialysis: Secondary | ICD-10-CM | POA: Diagnosis not present

## 2024-04-07 ENCOUNTER — Ambulatory Visit: Admitting: Gastroenterology

## 2024-04-07 VITALS — BP 116/74 | HR 77 | Ht 70.0 in | Wt 204.2 lb

## 2024-04-07 DIAGNOSIS — K625 Hemorrhage of anus and rectum: Secondary | ICD-10-CM

## 2024-04-07 DIAGNOSIS — K5909 Other constipation: Secondary | ICD-10-CM | POA: Diagnosis not present

## 2024-04-07 DIAGNOSIS — K219 Gastro-esophageal reflux disease without esophagitis: Secondary | ICD-10-CM | POA: Diagnosis not present

## 2024-04-07 DIAGNOSIS — K648 Other hemorrhoids: Secondary | ICD-10-CM | POA: Diagnosis not present

## 2024-04-07 DIAGNOSIS — Z860100 Personal history of colon polyps, unspecified: Secondary | ICD-10-CM

## 2024-04-07 DIAGNOSIS — M6289 Other specified disorders of muscle: Secondary | ICD-10-CM

## 2024-04-07 MED ORDER — HYDROCORTISONE ACETATE 25 MG RE SUPP
25.0000 mg | Freq: Every evening | RECTAL | 0 refills | Status: DC
Start: 2024-04-07 — End: 2024-09-01

## 2024-04-07 NOTE — Patient Instructions (Addendum)
 Recommend high fiber diet Start citrucel 1 tsp po daily No straining or pushing during bowel movements Can purchase squatty potty to help with bowel movements Continue Amitiza  Continue Lactulose  as needed Recommend pelvic floor therapy- call office if you would like referral  Start Anusol 1 suppository at bedtime for 10 days for hemorrhoids, if no improvement please schedule appt with Dr. Bridgett Camps to discuss possible hemorrhoid banding   Recommend GERD diet Continue famotidine  twice daily as needed  _______________________________________________________  If your blood pressure at your visit was 140/90 or greater, please contact your primary care physician to follow up on this.  _______________________________________________________  If you are age 64 or older, your body mass index should be between 23-30. Your Body mass index is 29.31 kg/m. If this is out of the aforementioned range listed, please consider follow up with your Primary Care Provider.  If you are age 40 or younger, your body mass index should be between 19-25. Your Body mass index is 29.31 kg/m. If this is out of the aformentioned range listed, please consider follow up with your Primary Care Provider.   ________________________________________________________  The [facility] providers would like to encourage you to use MYCHART to communicate with providers for non-urgent requests or questions.  Due to long hold times on the telephone, sending your provider a message by [facility] may be a faster and more efficient way to get a response.  Please allow 48 business hours for a response.  Please remember that this is for non-urgent requests.  _______________________________________________________  Thank you for trusting me with your gastrointestinal care. Deanna May, RNP

## 2024-04-07 NOTE — Progress Notes (Signed)
 Chief Complaint: blood in stool Primary GI Doctor:Dr. Bridgett Camps   HPI:  Albert Eaton is a 58 year old male with a history of GERD, hiccups, adenomatous colon polyps, constipation, ESRD, HIV, hypothyroidism, atrial fibrillation on Eliquis , severe mitral regurgitation who is here with complaint of blood in stool. Patient last seen by Dr. Bridgett Camps in office on 01/08/24 for follow-up on constipation and heartburn.  EGD in June 2022 was normal with the exception of a small hiatal hernia. Colonoscopy July 2022 normal, 5-year surveillance colonoscopy recommended given history of adenomatous polyps including those greater than a centimeter. Internal hemorrhoids.  Interval History     Patient presents with main complaint of BRB with wiping after defecation. Patient has history of chronic constipation and taking Amitiza  24 mcg twice daily and lactulose  prn. He reports at times he feels urge to have bowel movement with no result. He admits to sitting on toilet period of time with no result. He has had topicals for internal hemorrhoids in past, cannot recall how effective they were. We discussed his options today.   Patient admits to history of GERD and controlled with Famotidine  20 mg twice daily as needed. No current issues.   Wt Readings from Last 3 Encounters:  04/07/24 204 lb 4 oz (92.6 kg)  03/16/24 202 lb (91.6 kg)  03/02/24 199 lb 1.2 oz (90.3 kg)    Past Medical History:  Diagnosis Date   Anemia    Aortic atherosclerosis (HCC)    Arthritis    Ascending aorta dilation (HCC) 02/17/2023   Limited TTE 02/17/23: no effusion, s/p MV repair w trivial residual MR, asc aorta 39 mm, mod TR, severe LAE   Asthma    Cancer (HCC)    renal cyst   Chronic diastolic CHF (congestive heart failure) (HCC) 01/06/2017   Echo 7/16 [facility] in [place], [place]) Mild AI, mild LAE, mild concentric LVH, EF 55, normal wall motion, mild to moderate MR, mild PI, RVSP 55 // Echo 10/09/16 ([facility]):  Moderate LVH, grade 2  diastolic dysfunction, mild MR, moderate LAE    Dysrhythmia    paroxysmal afib/flutter   Esophagitis    ESRD (end stage renal disease) on dialysis [facility])    Dialysis Mon Wed Fri   GERD (gastroesophageal reflux disease)    Gout    no current problems   Heart murmur    had it since he was a baby- pt had echo 10/01/23   Hepatitis    Hep B   HIV (human immunodeficiency virus infection) (HCC)    HLD (hyperlipidemia)    Hypertension    Hypothyroidism    Mitral regurgitation    Myocardial infarction (HCC)    Neuropathy    Sleep apnea    does not use c-pap   Stroke [facility])    Wears glasses    Wears partial dentures     Past Surgical History:  Procedure Laterality Date   AV FISTULA PLACEMENT Left 07/02/2018   Procedure: Creation of Left arm BRACHIOBASILIC ARTERIOVENOUS  FISTULA;  Surgeon: Young Hensen, MD;  Location: [facility] OR;  Service: Vascular;  Laterality: Left;   BASCILIC VEIN TRANSPOSITION Left 08/27/2018   Procedure: Left arm BRACHIOBASILIC VEIN TRANSPOSITION SECOND STAGE;  Surgeon: Young Hensen, MD;  Location: [facility] OR;  Service: Vascular;  Laterality: Left;   BUBBLE STUDY  09/03/2020   Procedure: BUBBLE STUDY;  Surgeon: Elmyra Haggard, MD;  Location: [facility] ENDOSCOPY;  Service: Cardiovascular;;   BUBBLE STUDY  06/03/2022   Procedure: BUBBLE STUDY;  Surgeon: Euell Herrlich, MD;  Location: [facility] ENDOSCOPY;  Service: Cardiology;;   CAPD INSERTION N/A 05/30/2021   Procedure: LAPAROSCOPIC INSERTION CONTINUOUS AMBULATORY PERITONEAL DIALYSIS  (CAPD) CATHETER;  Surgeon: Carlene Che, MD;  Location: [facility] OR;  Service: Vascular;  Laterality: N/A;   CAPD REMOVAL N/A 02/13/2022   Procedure: PERITONEAL DIALYSIS CATHETER REMOVAL;  Surgeon: Carlene Che, MD;  Location: [facility] OR;  Service: Vascular;  Laterality: N/A;   CHOLECYSTECTOMY     COLONOSCOPY W/ BIOPSIES AND POLYPECTOMY     DRUG INDUCED ENDOSCOPY Bilateral 07/06/2023   Procedure: DRUG INDUCED SLEEP ENDOSCOPY;  Surgeon: Virgina Grills, MD;  Location: [facility] OR;  Service: ENT;  Laterality: Bilateral;   GIVENS CAPSULE STUDY N/A 03/01/2018   Procedure: GIVENS CAPSULE STUDY;  Surgeon: Nannette Babe, MD;  Location: [facility] ENDOSCOPY;  Service: Gastroenterology;  Laterality: N/A;   HERNIA REPAIR     As baby   IMPLANTATION OF HYPOGLOSSAL NERVE STIMULATOR Right 01/14/2024   Procedure: RIGHT IMPLANTATION OF HYPOGLOSSAL NERVE STIMULATOR;  Surgeon: Virgina Grills, MD;  Location: [facility] OR;  Service: ENT;  Laterality: Right;   INSERTION OF DIALYSIS CATHETER Right 01/20/2024   Procedure: INSERTION OF DIALYSIS CATHETER USING 19cm PALINDROME CHRONIC CATHETER INTO THE RIGHT INTERNAL JUGULAR;  Surgeon: Young Hensen, MD;  Location: [facility] OR;  Service: Vascular;  Laterality: Right;   LEFT HEART CATH AND CORONARY ANGIOGRAPHY N/A 09/11/2022   Procedure: LEFT HEART CATH AND CORONARY ANGIOGRAPHY;  Surgeon: Lucendia Rusk, MD;  Location: [facility] INVASIVE CV LAB;  Service: Cardiovascular;  Laterality: N/A;   MITRAL VALVE REPAIR N/A 08/21/2022   Procedure: MINIMALLY INVASIVE MITRAL VALVE REPAIR USING RING ANNULOPLASTY SIMULUS 30mm;  Surgeon: Melene Sportsman, MD;  Location: [facility] OR;  Service: Open Heart Surgery;  Laterality: N/A;   MULTIPLE TOOTH EXTRACTIONS     NEPHRECTOMY Left 02/18/2021   NOSE SURGERY     RENAL BIOPSY     REVISON OF ARTERIOVENOUS FISTULA Left 01/20/2024   Procedure: REVISON OF ARTERIOVENOUS FISTULA WITH ARTERIAL PLICATION;  Surgeon: Young Hensen, MD;  Location: [facility] OR;  Service: Vascular;  Laterality: Left;   RIGHT/LEFT HEART CATH AND CORONARY ANGIOGRAPHY N/A 09/04/2021   Procedure: RIGHT/LEFT HEART CATH AND CORONARY ANGIOGRAPHY;  Surgeon: Odie Benne, MD;  Location: [facility] INVASIVE CV LAB;  Service: Cardiovascular;  Laterality: N/A;   RIGHT/LEFT HEART CATH AND CORONARY ANGIOGRAPHY N/A 08/01/2022   Procedure: RIGHT/LEFT HEART CATH AND CORONARY ANGIOGRAPHY;  Surgeon: Arnoldo Lapping, MD;  Location: [facility] INVASIVE CV LAB;  Service:  Cardiovascular;  Laterality: N/A;   TEE WITHOUT CARDIOVERSION N/A 09/03/2020   Procedure: TRANSESOPHAGEAL ECHOCARDIOGRAM (TEE);  Surgeon: Elmyra Haggard, MD;  Location: [facility] ENDOSCOPY;  Service: Cardiovascular;  Laterality: N/A;   TEE WITHOUT CARDIOVERSION N/A 06/03/2022   Procedure: TRANSESOPHAGEAL ECHOCARDIOGRAM (TEE);  Surgeon: Euell Herrlich, MD;  Location: [facility] ENDOSCOPY;  Service: Cardiology;  Laterality: N/A;   TEE WITHOUT CARDIOVERSION N/A 08/21/2022   Procedure: TRANSESOPHAGEAL ECHOCARDIOGRAM (TEE);  Surgeon: Melene Sportsman, MD;  Location: [facility] OR;  Service: Open Heart Surgery;  Laterality: N/A;   UPPER GI ENDOSCOPY  04/05/2021    Current Outpatient Medications  Medication Sig Dispense Refill   allopurinol  (ZYLOPRIM ) 100 MG tablet TAKE ONE TABLET BY MOUTH DAILY 90 tablet 0   amiodarone  (PACERONE ) 200 MG tablet Take 200 mg by mouth daily.     amLODipine  (NORVASC ) 2.5 MG tablet Take 1 tablet (2.5 mg total) by mouth daily at 8 pm. 90 tablet 3   azelastine  (  ASTELIN ) 0.1 % nasal spray Place 2 sprays into both nostrils 2 (two) times daily as needed for allergies. 30 mL 0   B Complex-C-Zn-Folic Acid  (DIALYVITE  800/ZINC  PO) Take 1 tablet by mouth daily.     bictegravir-emtricitabine -tenofovir  AF (BIKTARVY ) 50-200-25 MG TABS tablet Take 1 tablet by mouth daily. 30 tablet 11   cetirizine  (ZYRTEC ) 10 MG tablet TAKE ONE TABLET BY MOUTH DAILY AT 9 a.m. 90 tablet 1   cinacalcet  (SENSIPAR ) 90 MG tablet Take 90 mg by mouth daily.     diclofenac  Sodium (VOLTAREN ) 1 % GEL Apply 2 g topically 4 (four) times daily. Apply to chest pain when lidocaine  patch not in place (Patient taking differently: Apply 2 g topically 4 (four) times daily as needed (pain).) 150 g 0   ezetimibe  (ZETIA ) 10 MG tablet Take 10 mg by mouth daily.     famotidine  (PEPCID ) 20 MG tablet Take 1 tablet (20 mg total) by mouth 2 (two) times daily. 180 tablet 3   ferric citrate  (AURYXIA ) 1 GM 210 MG(Fe) tablet Take 420 mg by mouth See admin  instructions. Take 420 mg by mouth three times a day with meals and 210 mg with each snack     fluticasone  (FLOVENT  HFA) 110 MCG/ACT inhaler Inhale 2 puffs into the lungs 2 (two) times daily as needed (for flares).     gabapentin  (NEURONTIN ) 300 MG capsule Take 1 capsule (300 mg total) by mouth daily as needed (pain/neuropathy).     HYDROcodone -acetaminophen  (NORCO/VICODIN) 5-325 MG tablet Take 1 tablet by mouth every 6 (six) hours as needed. 12 tablet 0   hydrocortisone (ANUSOL-HC) 25 MG suppository Place 1 suppository (25 mg total) rectally at bedtime. 10 suppository 0   ipratropium (ATROVENT ) 0.06 % nasal spray instill TWO SPRAYS IN EACH NOSTRIL 2-3 times DAILY AS NEEDED 60 mL 2   iron  sucrose (VENOFER ) 20 MG/ML injection Inject into the vein 3 (three) times a week.     lactulose  (CHRONULAC ) 10 GM/15ML solution Take 45 mLs (30 g total) by mouth daily as needed. Take 15-30 ml up to twice daily for constipation (Patient taking differently: Take 40 g by mouth daily as needed.) 1800 mL 11   levalbuterol  (XOPENEX  HFA) 45 MCG/ACT inhaler Inhale 2 puffs into the lungs every 4 (four) hours as needed for wheezing. 15 g 1   levothyroxine  (SYNTHROID ) 25 MCG tablet Take 1 tablet (25 mcg total) by mouth daily before breakfast. 90 tablet 1   lubiprostone  (AMITIZA ) 24 MCG capsule Take 1 capsule (24 mcg total) by mouth 2 (two) times daily with a meal. NEEDS APPT FOR FURTHER REFILLS 180 capsule 0   Methoxy PEG-Epoetin  Beta (MIRCERA IJ) every 30 (thirty) days.     metoprolol  tartrate (LOPRESSOR ) 25 MG tablet Take 1 tablet (25 mg total) by mouth 2 (two) times daily. 90 tablet 3   Olopatadine  HCl (PATADAY ) 0.7 % SOLN Place 1 drop into both eyes 2 (two) times daily as needed (allergies). 2.5 mL 5   pantoprazole  (PROTONIX ) 40 MG tablet Take 1 tablet (40 mg total) by mouth daily. 30 tablet 5   rOPINIRole  (REQUIP ) 1 MG tablet Take 1 tablet (1 mg total) by mouth at bedtime. 90 tablet 2   rosuvastatin  (CRESTOR ) 10 MG  tablet Take 1 tablet (10 mg total) by mouth daily. 90 tablet 1   sevelamer  carbonate (RENVELA ) 800 MG tablet Take 2,400 mg by mouth daily.     Spacer/Aero-Holding Chambers DEVI 1 Device by Does not apply route 2 (two)  times a day. 1 each 1   SYMBICORT  160-4.5 MCG/ACT inhaler Inhale 2 puffs into the lungs in the morning and at bedtime. (Patient taking differently: Inhale 2 puffs into the lungs 2 (two) times daily as needed (Asthma).) 10.2 g 5   tiZANidine  (ZANAFLEX ) 2 MG tablet Take 1 tablet (2 mg total) by mouth 2 (two) times daily as needed for muscle spasms. 30 tablet 0   Vitamin D , Ergocalciferol , (DRISDOL ) 1.25 MG (50000 UNIT) CAPS capsule Take 1 capsule (50,000 Units total) by mouth every Tuesday. 12 capsule 0   warfarin (COUMADIN ) 5 MG tablet Take 1 tablet to 2 tablets by mouth daily as prescribed by coumadin  clinic (Patient taking differently: Take 5-10 mg by mouth See admin instructions. Take 5mg  by mouth once daily on Monday, Wednesday and Friday. Take 10mg  by mouth all other days of the week.) 140 tablet 0   No current facility-administered medications for this visit.    Allergies as of 04/07/2024 - Review Complete 04/07/2024  Allergen Reaction Noted   Ace inhibitors Cough and Other (See Comments) 05/07/2015    Family History  Problem Relation Age of Onset   Hypertension Mother    Heart failure Mother    Hypertension Father    Alcohol abuse Father    Hypertension Sister    Multiple sclerosis Sister    Hypertension Sister    Hypertension Brother    Heart attack Maternal Grandmother 6   Scoliosis Other    Allergic rhinitis Neg Hx    Angioedema Neg Hx    Asthma Neg Hx    Eczema Neg Hx    Immunodeficiency Neg Hx    Urticaria Neg Hx    Colon cancer Neg Hx    Pancreatic cancer Neg Hx    Esophageal cancer Neg Hx     Review of Systems:    Constitutional: No weight loss, fever, chills, weakness or fatigue HEENT: Eyes: No change in vision               Ears, Nose, Throat:   No change in hearing or congestion Skin: No rash or itching Cardiovascular: No chest pain, chest pressure or palpitations   Respiratory: No SOB or cough Gastrointestinal: See HPI and otherwise negative Genitourinary: No dysuria or change in urinary frequency Neurological: No headache, dizziness or syncope Musculoskeletal: No new muscle or joint pain Hematologic: No bleeding or bruising Psychiatric: No history of depression or anxiety    Physical Exam:  Vital signs: BP 116/74   Pulse 77   Ht 5' 10 (1.778 m)   Wt 204 lb 4 oz (92.6 kg)   SpO2 97%   BMI 29.31 kg/m   Constitutional:  Pleasant A.A. male appears to be in NAD, Well developed, Well nourished, alert and cooperative Throat: Oral cavity and pharynx without inflammation, swelling or lesion.  Respiratory: Respirations even and unlabored. Lungs clear to auscultation bilaterally.   No wheezes, crackles, or rhonchi.  Cardiovascular: Normal S1, S2. Regular rate and rhythm. No peripheral edema, cyanosis or pallor.  Gastrointestinal:  Soft, nondistended, nontender. No rebound or guarding. Normal bowel sounds. No appreciable masses or hepatomegaly. Rectal: not preformed. Pt declined exam Msk:  Symmetrical without gross deformities. Without edema, no deformity or joint abnormality.  Neurologic:  Alert and  oriented x4;  grossly normal neurologically.  Skin:   Dry and intact without significant lesions or rashes. Psychiatric: Oriented to person, place and time. Demonstrates good judgement and reason without abnormal affect or behaviors.  RELEVANT LABS AND  IMAGING: CBC    Latest Ref Rng & Units 03/02/2024    9:47 AM 01/20/2024    6:03 AM 01/14/2024    1:26 PM  CBC  WBC 4.0 - 10.5 K/uL 6.1     Hemoglobin 13.0 - 17.0 g/dL 19.1  47.8  29.5   Hematocrit 39.0 - 52.0 % 34.7  35.0  34.0   Platelets 150 - 400 K/uL 267        CMP     Latest Ref Rng & Units 03/03/2024    9:38 AM 03/02/2024    9:47 AM 01/20/2024    6:03 AM  CMP  Glucose  70 - 99 mg/dL 621  88  99   BUN 6 - 20 mg/dL 37  23  28   Creatinine 0.61 - 1.24 mg/dL 30.86  5.78  4.69   Sodium 135 - 145 mmol/L 133  138  138   Potassium 3.5 - 5.1 mmol/L 2.9  2.8  3.5   Chloride 98 - 111 mmol/L 95  94  99   CO2 22 - 32 mmol/L 24  26    Calcium  8.9 - 10.3 mg/dL 8.7  62.9       Lab Results  Component Value Date   TSH 2.868 09/08/2022   03/03/24 echo- Left ventricular ejection fraction, by estimation, is 50 to 55%.   Assessment: Encounter Diagnoses  Name Primary?   Chronic constipation Yes   Gastroesophageal reflux disease without esophagitis    Hx of colonic polyp    Internal hemorrhoids    Rectal bleeding    Pelvic floor dysfunction     58 year old A. A male patient who presents with intermittent BRB with wiping. Patient has history of chronic constipation and admits he strains and is bathroom prolonged periods of times. He c/o urge to go to restroom without result, despite being on Amitiza  and Lactulose . Recommended he do pelvic floor therapy, declined for now. Recent colon July 22 revealed internal hemorrhoids, will treat with Anusol suppository at bedtime. Declined exam today. We discussed ways to prevent reoccurrence as well as alternative therapies such as hemorrhoid banding if no improvement.     Patients GERD is well controlled, no changes.  Plan: -High fiber diet -Start Citrucel 1 tsp po daily  -Cont Amitiza  -Cont Lactulose  prn -Recommend Squatty potty  -Recommend pelvic floor therapy, patient declined -Anusol suppository 1 at bedtime x 10 days  -GERD diet, no late meals -Cont Pepcid  -RTC if symptoms do not improve or worsen   Thank you for the courtesy of this consult. Please call me with any questions or concerns.   Malyah Ohlrich, FNP-C [facility] 04/07/2024, 9:36 AM  Cc: Dorothe Gaster, NP

## 2024-04-12 ENCOUNTER — Telehealth: Payer: Self-pay | Admitting: *Deleted

## 2024-04-12 ENCOUNTER — Ambulatory Visit

## 2024-04-12 NOTE — Telephone Encounter (Signed)
 Called pt since he missed his appointment today at the Anticoagulation Clinic. There was no answer so left him a message to call back to reschedule.

## 2024-04-14 ENCOUNTER — Ambulatory Visit: Attending: Cardiovascular Disease | Admitting: *Deleted

## 2024-04-14 DIAGNOSIS — I480 Paroxysmal atrial fibrillation: Secondary | ICD-10-CM | POA: Diagnosis not present

## 2024-04-14 DIAGNOSIS — Z5181 Encounter for therapeutic drug level monitoring: Secondary | ICD-10-CM

## 2024-04-14 LAB — PROTIME-INR
INR: 6.2 (ref 0.9–1.2)
Prothrombin Time: 61.4 s — ABNORMAL HIGH (ref 9.1–12.0)

## 2024-04-14 LAB — POCT INR: POC INR: 6.9

## 2024-04-14 NOTE — Progress Notes (Signed)
Please see anticoagulation encounter.

## 2024-04-14 NOTE — Patient Instructions (Addendum)
 Description   Called and spoke to pt and instructed him to: -Hold warfarin 6/19, 6/20, 6/21 -Then START taking warfarin 1 tablet daily except for 2 tablets on Sundays and Thursdays. Recheck INR in 1 week.  Coumadin  Clinic [phone]

## 2024-04-20 ENCOUNTER — Other Ambulatory Visit: Payer: Self-pay | Admitting: Allergy & Immunology

## 2024-04-21 ENCOUNTER — Ambulatory Visit: Attending: Cardiovascular Disease | Admitting: *Deleted

## 2024-04-21 DIAGNOSIS — I480 Paroxysmal atrial fibrillation: Secondary | ICD-10-CM

## 2024-04-21 DIAGNOSIS — Z5181 Encounter for therapeutic drug level monitoring: Secondary | ICD-10-CM

## 2024-04-21 LAB — POCT INR: POC INR: 1.3

## 2024-04-21 NOTE — Progress Notes (Signed)
Please see anticoagulation encounter.

## 2024-04-21 NOTE — Patient Instructions (Signed)
 Description   Take 3 tablets of warfarin today Tomorrow take 1.5 tablets Then continue taking warfarin 1 tablet daily except for 2 tablets on Sundays and Thursdays. Recheck INR in 1 week.  Coumadin  Clinic [phone]

## 2024-04-25 ENCOUNTER — Encounter: Payer: Self-pay | Admitting: Adult Health

## 2024-04-25 ENCOUNTER — Ambulatory Visit (INDEPENDENT_AMBULATORY_CARE_PROVIDER_SITE_OTHER): Admitting: Adult Health

## 2024-04-25 VITALS — BP 134/48 | HR 91 | Ht 70.0 in | Wt 203.6 lb

## 2024-04-25 DIAGNOSIS — G4733 Obstructive sleep apnea (adult) (pediatric): Secondary | ICD-10-CM | POA: Diagnosis not present

## 2024-04-25 DIAGNOSIS — R911 Solitary pulmonary nodule: Secondary | ICD-10-CM | POA: Diagnosis not present

## 2024-04-25 DIAGNOSIS — N186 End stage renal disease: Secondary | ICD-10-CM

## 2024-04-25 DIAGNOSIS — J4520 Mild intermittent asthma, uncomplicated: Secondary | ICD-10-CM | POA: Diagnosis not present

## 2024-04-25 DIAGNOSIS — Z992 Dependence on renal dialysis: Secondary | ICD-10-CM

## 2024-04-25 DIAGNOSIS — R918 Other nonspecific abnormal finding of lung field: Secondary | ICD-10-CM

## 2024-04-25 DIAGNOSIS — Z87891 Personal history of nicotine dependence: Secondary | ICD-10-CM

## 2024-04-25 NOTE — Assessment & Plan Note (Signed)
 Continue follow-up with asthma and allergy

## 2024-04-25 NOTE — Assessment & Plan Note (Addendum)
 CPAP intolerance status post inspire implantation [place] 20, 2025.  Inspire activation on Mar 16, 2024. Stimulation test today in the office shows good tongue movement at 1.0 and 1.1 V.  Have encouraged patient to use daily.  Will decrease voltage range 0.9 to 1.9 V.  Can increase by 1 level each week as tolerated.  I encouraged him to try to use it all night long. Continue on current settings with start delay 60 minutes, pause time 30 minutes.  Duration 8 hours.  Plan  Patient Instructions  CT chest in 1 year.  Keep follow up with Asthma and Allergy.   Inspire device :  Increase up 1 level each week as tolerated.  Follow up in 1 month and As needed

## 2024-04-25 NOTE — Progress Notes (Signed)
 @Patient  ID: Albert Eaton, male    DOB: September 04, 1966, 58 y.o.   MRN: [id number]  Chief Complaint  Patient presents with   Follow-up    Referring provider: [name], NP  HPI: 58 yo male former smoker followed for OSA and Lung nodule  Medical history significant for ESRD on Hemodialysis, History of renal cell carcinoma s/p left nephrectomy, mitral valve replacement, CHF, HIV, CAD, A Flutter, Neuropathy, Asthma   TEST/EVENTS :  - CT chest 01/25/24 stable 3.52mm RML nodule, chronic basilar fibrotic changes, sternal fracture  CT chest 03/23/24 -stable mild subpleural fibrotic changes, stable 5 mm noncalcified pulmonary nodule in the right middle lobe, no new nodules.,  Status post left nephrectomy, fracture at the mid sternum first noted on x-ray December 2023  04/25/2024 Follow up ; OSA-Inspire, Lung nodule  Patient returns for a 1 month follow-up.  Patient had obstructive sleep apnea with CPAP intolerance.  He underwent inspire implantation by ENT on [place] 20, 2025.  Did inspire activation on Mar 16, 2024. Start delay of 60 minutes, pause time 30 min , duration 8 hours.  Patient range at 1.0 to 2.0 V.  Since last visit patient says he is doing okay trying to use his inspire each night.  Does admit that he falls asleep sometimes and forgets to turn it on.  He is currently on level 2.  He did try to go up to level 3 (1.2 V) but says he was not able to tolerate.  Feels the voltage is too strong so he turns it off. He is currently on level 2 at 1.1 V. Patient denies any difficulty swallowing. Compliance report shows 80% usage.  Daily average usage at 3 hours and 42 minutes. Stimulation test in the office shows good tongue movement.  Waveform is normal at 1.1 V.  Patient is a former smoker.  A CT chest was done in [place] 2025 that showed a small right middle lobe nodule.  Follow-up CT chest done on Mar 23, 2024 showed a stable 5 mm noncalcified pulmonary nodule in the right middle lobe.  Mild  subpleural fibrotic changes-stableAnd sternal fracture.  Patient says he had a sternal fracture from CPR that was done during cardiac arrest in 2023 at dialysis. He has a history of renal cancer s/p left nephrectomy. On Transplant list at [facility] and [facility].   Going on Vacation to [place]  next month.   Has a history of Asthma, followed by Asthma and Allergy. No flare of cough or wheezing.   Allergies  Allergen Reactions   Ace Inhibitors Cough and Other (See Comments)    Immunization History  Administered Date(s) Administered   Influenza, Seasonal, Injecte, Preservative Fre 07/09/2023   Influenza,inj,Quad PF,6+ Mos 10/10/2016, 08/31/2017, 07/08/2018, 06/23/2019, 07/11/2020, 09/05/2021, 08/07/2022   Influenza-Unspecified 07/08/2018, 05/23/2019   Meningococcal Conjugate 10/15/2016, 08/31/2017   Meningococcal Mcv4o 10/15/2016, 08/31/2017   PFIZER Comirnaty(Gray Top)Covid-19 Tri-Sucrose Vaccine 06/13/2020, 05/28/2021   PFIZER(Purple Top)SARS-COV-2 Vaccination 01/06/2020, 02/01/2020, 06/13/2020   PNEUMOCOCCAL CONJUGATE-20 02/04/2024   Pfizer Covid-19 Vaccine Bivalent Booster 65yrs & up 09/13/2021, 03/11/2022   Pfizer(Comirnaty)Fall Seasonal Vaccine 12 years and older 08/18/2022, 07/09/2023   Pneumococcal Conjugate-13 12/29/2018   Pneumococcal Polysaccharide-23 10/15/2016, 10/13/2019   Tdap 01/27/2017   Vaccinia,smallpox Monkeypox Vaccine Live,pf 06/06/2021, 07/04/2021   Zoster Recombinant(Shingrix) 06/13/2020, 08/12/2020, 07/27/2021, 08/27/2021    Past Medical History:  Diagnosis Date   Anemia    Aortic atherosclerosis (HCC)    Arthritis    Ascending aorta dilation (HCC) 02/17/2023  Limited TTE 02/17/23: no effusion, s/p MV repair w trivial residual MR, asc aorta 39 mm, mod TR, severe LAE   Asthma    Cancer (HCC)    renal cyst   Chronic diastolic CHF (congestive heart failure) (HCC) 01/06/2017   Echo 7/16 [facility] in [place], [place]) Mild AI, mild LAE, mild concentric LVH, EF 55,  normal wall motion, mild to moderate MR, mild PI, RVSP 55 // Echo 10/09/16 ([facility]):  Moderate LVH, grade 2 diastolic dysfunction, mild MR, moderate LAE    Dysrhythmia    paroxysmal afib/flutter   Esophagitis    ESRD (end stage renal disease) on dialysis [facility])    Dialysis Mon Wed Fri   GERD (gastroesophageal reflux disease)    Gout    no current problems   Heart murmur    had it since he was a baby- pt had echo 10/01/23   Hepatitis    Hep B   HIV (human immunodeficiency virus infection) (HCC)    HLD (hyperlipidemia)    Hypertension    Hypothyroidism    Mitral regurgitation    Myocardial infarction (HCC)    Neuropathy    Sleep apnea    does not use c-pap   Stroke [facility])    Wears glasses    Wears partial dentures     Tobacco History: Social History   Tobacco Use  Smoking Status Former   Current packs/day: 0.00   Average packs/day: 1 pack/day for 18.0 years (18.0 ttl pk-yrs)   Types: Cigarettes   Start date: 42   Quit date: 2000   Years since quitting: 25.5  Smokeless Tobacco Never   Counseling given: Not Answered   Outpatient Medications Prior to Visit  Medication Sig Dispense Refill   allopurinol  (ZYLOPRIM ) 100 MG tablet TAKE ONE TABLET BY MOUTH DAILY 90 tablet 0   amiodarone  (PACERONE ) 200 MG tablet Take 200 mg by mouth daily.     amLODipine  (NORVASC ) 2.5 MG tablet Take 1 tablet (2.5 mg total) by mouth daily at 8 pm. 90 tablet 3   azelastine  (ASTELIN ) 0.1 % nasal spray Place 2 sprays into both nostrils 2 (two) times daily as needed for allergies. 30 mL 0   B Complex-C-Zn-Folic Acid  (DIALYVITE  800/ZINC  PO) Take 1 tablet by mouth daily.     bictegravir-emtricitabine -tenofovir  AF (BIKTARVY ) 50-200-25 MG TABS tablet Take 1 tablet by mouth daily. 30 tablet 11   cetirizine  (ZYRTEC ) 10 MG tablet TAKE ONE TABLET BY MOUTH DAILY AT 9 a.m. 90 tablet 1   cinacalcet  (SENSIPAR ) 90 MG tablet Take 90 mg by mouth daily.     diclofenac  Sodium (VOLTAREN ) 1 % GEL Apply 2 g topically  4 (four) times daily. Apply to chest pain when lidocaine  patch not in place (Patient taking differently: Apply 2 g topically 4 (four) times daily as needed (pain).) 150 g 0   ezetimibe  (ZETIA ) 10 MG tablet Take 10 mg by mouth daily.     famotidine  (PEPCID ) 20 MG tablet Take 1 tablet (20 mg total) by mouth 2 (two) times daily. 180 tablet 3   ferric citrate  (AURYXIA ) 1 GM 210 MG(Fe) tablet Take 420 mg by mouth See admin instructions. Take 420 mg by mouth three times a day with meals and 210 mg with each snack     fluticasone  (FLOVENT  HFA) 110 MCG/ACT inhaler Inhale 2 puffs into the lungs 2 (two) times daily as needed (for flares).     gabapentin  (NEURONTIN ) 300 MG capsule Take 1 capsule (300 mg total) by  mouth daily as needed (pain/neuropathy).     HYDROcodone -acetaminophen  (NORCO/VICODIN) 5-325 MG tablet Take 1 tablet by mouth every 6 (six) hours as needed. 12 tablet 0   hydrocortisone  (ANUSOL -HC) 25 MG suppository Place 1 suppository (25 mg total) rectally at bedtime. 10 suppository 0   ipratropium (ATROVENT ) 0.06 % nasal spray instill TWO SPRAYS IN EACH NOSTRIL 2-3 times DAILY AS NEEDED 60 mL 2   iron  sucrose (VENOFER ) 20 MG/ML injection Inject into the vein 3 (three) times a week.     lactulose  (CHRONULAC ) 10 GM/15ML solution Take 45 mLs (30 g total) by mouth daily as needed. Take 15-30 ml up to twice daily for constipation (Patient taking differently: Take 40 g by mouth daily as needed.) 1800 mL 11   levalbuterol  (XOPENEX  HFA) 45 MCG/ACT inhaler Inhale 2 puffs into the lungs every 4 (four) hours as needed for wheezing. 15 g 1   levothyroxine  (SYNTHROID ) 25 MCG tablet Take 1 tablet (25 mcg total) by mouth daily before breakfast. 90 tablet 1   lubiprostone  (AMITIZA ) 24 MCG capsule Take 1 capsule (24 mcg total) by mouth 2 (two) times daily with a meal. NEEDS APPT FOR FURTHER REFILLS 180 capsule 0   Methoxy PEG-Epoetin  Beta (MIRCERA IJ) every 30 (thirty) days.     metoprolol  tartrate (LOPRESSOR ) 25  MG tablet Take 1 tablet (25 mg total) by mouth 2 (two) times daily. 90 tablet 3   Olopatadine  HCl (PATADAY ) 0.7 % SOLN Place 1 drop into both eyes 2 (two) times daily as needed (allergies). 2.5 mL 5   pantoprazole  (PROTONIX ) 40 MG tablet Take 1 tablet (40 mg total) by mouth daily. 30 tablet 5   rOPINIRole  (REQUIP ) 1 MG tablet Take 1 tablet (1 mg total) by mouth at bedtime. 90 tablet 2   rosuvastatin  (CRESTOR ) 10 MG tablet Take 1 tablet (10 mg total) by mouth daily. 90 tablet 1   sevelamer  carbonate (RENVELA ) 800 MG tablet Take 2,400 mg by mouth daily.     Spacer/Aero-Holding Chambers DEVI 1 Device by Does not apply route 2 (two) times a day. 1 each 1   SYMBICORT  160-4.5 MCG/ACT inhaler Inhale 2 puffs into the lungs in the morning and at bedtime. (Patient taking differently: Inhale 2 puffs into the lungs 2 (two) times daily as needed (Asthma).) 10.2 g 5   tiZANidine  (ZANAFLEX ) 2 MG tablet Take 1 tablet (2 mg total) by mouth 2 (two) times daily as needed for muscle spasms. 30 tablet 0   Vitamin D , Ergocalciferol , (DRISDOL ) 1.25 MG (50000 UNIT) CAPS capsule Take 1 capsule (50,000 Units total) by mouth every Tuesday. 12 capsule 0   warfarin (COUMADIN ) 5 MG tablet Take 1 tablet to 2 tablets by mouth daily as prescribed by coumadin  clinic (Patient taking differently: Take 5-10 mg by mouth See admin instructions. Take 5mg  by mouth once daily on Monday, Wednesday and Friday. Take 10mg  by mouth all other days of the week.) 140 tablet 0   No facility-administered medications prior to visit.     Review of Systems:   Constitutional:   No  weight loss, night sweats,  Fevers, chills,+ fatigue, or  lassitude.  HEENT:   No headaches,  Difficulty swallowing,  Tooth/dental problems, or  Sore throat,                No sneezing, itching, ear ache, nasal congestion, post nasal drip,   CV:  No chest pain,  Orthopnea, PND, swelling in lower extremities, anasarca, dizziness, palpitations, syncope.   GI  No  heartburn, indigestion, abdominal pain, nausea, vomiting, diarrhea, change in bowel habits, loss of appetite, bloody stools.   Resp:  No chest wall deformity  Skin: no rash or lesions.  GU: no dysuria, change in color of urine, no urgency or frequency.  No flank pain, no hematuria   MS:  No joint pain or swelling.  No decreased range of motion.  No back pain.    Physical Exam  BP (!) 134/48 (BP Location: Right Arm, Patient Position: Sitting, Cuff Size: Normal)   Pulse 91   Ht 5' 10 (1.778 m)   Wt 203 lb 9.6 oz (92.4 kg)   SpO2 99%   BMI 29.21 kg/m   GEN: A/Ox3; pleasant , NAD, well nourished    HEENT:  [place]/AT,  NOSE-clear, THROAT-clear, no lesions, no postnasal drip or exudate noted. Normal tongue movement   NECK:  Supple w/ fair ROM; no JVD; normal carotid impulses w/o bruits; no thyromegaly or nodules palpated; no lymphadenopathy.    RESP  Clear  P & A; w/o, wheezes/ rales/ or rhonchi. no accessory muscle use, no dullness to percussion  CARD:  RRR, no m/r/g,tr  peripheral edema, pulses intact, no cyanosis or clubbing. LUE AV fistula   GI:   Soft & nt; nml bowel sounds; no organomegaly or masses detected.   Musco: Warm bil, no deformities or joint swelling noted.   Neuro: alert, no focal deficits noted.    Skin: Warm, no lesions or rashes    Lab Results:  CBC  BNP  Imaging: No results found.  Administration History     None           No data to display          No results found for: NITRICOXIDE      Assessment & Plan:   OSA (obstructive sleep apnea) CPAP intolerance status post inspire implantation [place] 20, 2025.  Inspire activation on Mar 16, 2024. Stimulation test today in the office shows good tongue movement at 1.0 and 1.1 V.  Have encouraged patient to use daily.  Will decrease voltage range 0.9 to 1.9 V.  Can increase by 1 level each week as tolerated.  I encouraged him to try to use it all night long. Continue on current settings  with start delay 60 minutes, pause time 30 minutes.  Duration 8 hours.  Plan  Patient Instructions  CT chest in 1 year.  Keep follow up with Asthma and Allergy.   Inspire device :  Increase up 1 level each week as tolerated.  Follow up in 1 month and As needed       Mild intermittent asthma Continue follow-up with asthma and allergy  Lung nodule Small lung nodule 5 mm noted in the right middle lobe.  Appears stable on CT scanning.  Patient does have a history of smoking and also renal cell carcinoma.  Will need a follow-up CT in 1 year  ESRD (end stage renal disease) on dialysis [facility]) Continue follow up with Nephrology    I spent  43  minutes dedicated to the care of this patient on the date of this encounter to include pre-visit review of records, face-to-face time with the patient discussing conditions above, post visit ordering of testing, clinical documentation with the electronic health record, making appropriate referrals as documented, and communicating necessary findings to members of the patients care team.    Madelin Stank, NP 04/25/2024

## 2024-04-25 NOTE — Assessment & Plan Note (Signed)
 Small lung nodule 5 mm noted in the right middle lobe.  Appears stable on CT scanning.  Patient does have a history of smoking and also renal cell carcinoma.  Will need a follow-up CT in 1 year

## 2024-04-25 NOTE — Assessment & Plan Note (Signed)
Continue follow up with Nephrology.

## 2024-04-25 NOTE — Patient Instructions (Addendum)
 CT chest in 1 year.  Keep follow up with Asthma and Allergy.   Inspire device :  Increase up 1 level each week as tolerated.  Follow up in 1 month and As needed

## 2024-04-28 ENCOUNTER — Ambulatory Visit: Attending: Cardiovascular Disease | Admitting: Pharmacist

## 2024-04-28 DIAGNOSIS — Z7901 Long term (current) use of anticoagulants: Secondary | ICD-10-CM | POA: Diagnosis not present

## 2024-04-28 DIAGNOSIS — Z9889 Other specified postprocedural states: Secondary | ICD-10-CM

## 2024-04-28 DIAGNOSIS — I480 Paroxysmal atrial fibrillation: Secondary | ICD-10-CM

## 2024-04-28 DIAGNOSIS — I4892 Unspecified atrial flutter: Secondary | ICD-10-CM

## 2024-04-28 LAB — POCT INR: INR: 1.6 — AB (ref 2.0–3.0)

## 2024-04-28 NOTE — Patient Instructions (Signed)
 Description   Continue taking 3 tablets on Thursday. Increase to 2 tablets on Sundays and Fridays. Then take 1 tablet all other days of the week. Recheck INR in 1 week.  Coumadin  Clinic [phone]

## 2024-04-28 NOTE — Progress Notes (Signed)
Please see anticoagulation encounter.

## 2024-05-01 NOTE — Progress Notes (Signed)
 Addendum: Reviewed and agree with assessment and management plan. Asha Grumbine, Carie Caddy, MD

## 2024-05-02 ENCOUNTER — Ambulatory Visit

## 2024-05-03 ENCOUNTER — Ambulatory Visit: Attending: Cardiovascular Disease | Admitting: Pharmacist

## 2024-05-03 DIAGNOSIS — Z7901 Long term (current) use of anticoagulants: Secondary | ICD-10-CM | POA: Diagnosis not present

## 2024-05-03 DIAGNOSIS — I4892 Unspecified atrial flutter: Secondary | ICD-10-CM

## 2024-05-03 DIAGNOSIS — Z9889 Other specified postprocedural states: Secondary | ICD-10-CM

## 2024-05-03 DIAGNOSIS — I480 Paroxysmal atrial fibrillation: Secondary | ICD-10-CM

## 2024-05-03 LAB — POCT INR: INR: 1.3 — AB (ref 2.0–3.0)

## 2024-05-03 NOTE — Patient Instructions (Signed)
 Description   Take 2 tablets today and then continue 2 tablets on Sundays, Tuesdays, and Fridays. Continue taking 3 tablets on Thursday. Then take 1 tablet all other days of the week. Recheck INR in 2 week.  Coumadin  Clinic [phone]

## 2024-05-03 NOTE — Progress Notes (Signed)
Please see anticoagulation encounter.

## 2024-05-06 ENCOUNTER — Other Ambulatory Visit: Payer: Self-pay | Admitting: *Deleted

## 2024-05-06 MED ORDER — PANTOPRAZOLE SODIUM 40 MG PO TBEC
40.0000 mg | DELAYED_RELEASE_TABLET | Freq: Every day | ORAL | 5 refills | Status: AC
Start: 2024-05-06 — End: ?

## 2024-05-17 ENCOUNTER — Ambulatory Visit: Attending: Internal Medicine

## 2024-05-17 DIAGNOSIS — Z9889 Other specified postprocedural states: Secondary | ICD-10-CM

## 2024-05-17 DIAGNOSIS — Z7901 Long term (current) use of anticoagulants: Secondary | ICD-10-CM | POA: Diagnosis not present

## 2024-05-17 DIAGNOSIS — I480 Paroxysmal atrial fibrillation: Secondary | ICD-10-CM

## 2024-05-17 DIAGNOSIS — I4892 Unspecified atrial flutter: Secondary | ICD-10-CM

## 2024-05-17 LAB — POCT INR: INR: 1.9 — AB (ref 2.0–3.0)

## 2024-05-17 NOTE — Progress Notes (Signed)
 INR 1.9  Please see anticoagulation encounter

## 2024-05-17 NOTE — Patient Instructions (Signed)
 Take 2.5 tablets today and then continue taking 2 tablets daily, except 1 tablet every Monday and Friday.  Recheck INR in 3 weeks.  Coumadin  Clinic [phone]

## 2024-05-23 ENCOUNTER — Other Ambulatory Visit: Payer: Self-pay | Admitting: Pulmonary Disease

## 2024-05-26 ENCOUNTER — Ambulatory Visit: Admitting: Adult Health

## 2024-05-26 ENCOUNTER — Encounter: Payer: Self-pay | Admitting: Adult Health

## 2024-05-26 VITALS — BP 147/89 | HR 78 | Temp 97.8°F | Ht 70.0 in | Wt 205.0 lb

## 2024-05-26 DIAGNOSIS — R053 Chronic cough: Secondary | ICD-10-CM | POA: Diagnosis not present

## 2024-05-26 DIAGNOSIS — Z87891 Personal history of nicotine dependence: Secondary | ICD-10-CM | POA: Diagnosis not present

## 2024-05-26 DIAGNOSIS — R918 Other nonspecific abnormal finding of lung field: Secondary | ICD-10-CM

## 2024-05-26 DIAGNOSIS — R911 Solitary pulmonary nodule: Secondary | ICD-10-CM

## 2024-05-26 DIAGNOSIS — R9389 Abnormal findings on diagnostic imaging of other specified body structures: Secondary | ICD-10-CM

## 2024-05-26 DIAGNOSIS — G4733 Obstructive sleep apnea (adult) (pediatric): Secondary | ICD-10-CM

## 2024-05-26 DIAGNOSIS — J4520 Mild intermittent asthma, uncomplicated: Secondary | ICD-10-CM

## 2024-05-26 NOTE — Progress Notes (Signed)
 @Patient  ID: Albert Eaton, male    DOB: Oct 18, 1966, 58 y.o.   MRN: [id number]  Chief Complaint  Patient presents with   Medical Management of Chronic Issues    F/u inspire     Referring provider: [name], NP  HPI: 58 yo male former smoker followed for OSA s/p Inspire and Lung nodule  Medical history significant for ESRD on Hemodialysis, History of renal cell carcinoma s/p left nephrectomy, mitral valve replacement, CHF, HIV, CAD, A Flutter, Neuropathy, Asthma   TEST/EVENTS :  CT chest 01/25/24 stable 3.59mm RML nodule, chronic basilar fibrotic changes, sternal fracture   CT chest 03/23/24 -stable mild subpleural fibrotic changes, stable 5 mm noncalcified pulmonary nodule in the right middle lobe, no new nodules.,  Status post left nephrectomy, fracture at the mid sternum first noted on x-ray December 2023  Inspire implantation by ENT on Buis 20, 2025.   Inspire activation on Mar 16, 2024. Start delay of 60 minutes, pause time 30 min , duration 8 hours.  Patient range at 1.0 to 2.0 V.    05/26/2024 Follow up ; OSA s/p Inspire  Discussed the use of AI scribe software for clinical note transcription with the patient, who gave verbal consent to proceed.  History of Present Illness Albert Eaton is a 58 year old male with sleep apnea who presents for follow-up on his Inspire device usage.  He uses his Inspire device inconsistently, occasionally forgetting to activate it before sleep. He is currently increasing the device's level by one level each week and is at level 1.4v  Typically, he uses the device for about four hours a night, as he wakes up early for dialysis on certain days. On non HD days uses longer. Compliance shows average usage at 4hr. Current level at 1.4v. tongue movement normal with device stimulation test. Waveform is normal.   He has a history of mild asthma and uses Symbicort  and an albuterol  inhaler. He quit smoking in 2000. Recently, he has experienced a  cough when taking deep breaths, which he attributes to asthma. No significant sinus drainage is noted, but there is some mucus in the back of his throat.followed by Asthma and Allergy .   A previous CT scan showed scarring and a tiny nodule in the right middle lobe. He experiences occasional coughing when taking deep breaths, which he associates with asthma.      Allergies  Allergen Reactions   Ace Inhibitors Cough and Other (See Comments)    Immunization History  Administered Date(s) Administered   Influenza, Seasonal, Injecte, Preservative Fre 07/09/2023   Influenza,inj,Quad PF,6+ Mos 10/10/2016, 08/31/2017, 07/08/2018, 06/23/2019, 07/11/2020, 09/05/2021, 08/07/2022   Influenza-Unspecified 07/08/2018, 05/23/2019   Meningococcal Conjugate 10/15/2016, 08/31/2017   Meningococcal Mcv4o 10/15/2016, 08/31/2017   PFIZER Comirnaty(Gray Top)Covid-19 Tri-Sucrose Vaccine 06/13/2020, 05/28/2021   PFIZER(Purple Top)SARS-COV-2 Vaccination 01/06/2020, 02/01/2020, 06/13/2020   PNEUMOCOCCAL CONJUGATE-20 02/04/2024   Pfizer Covid-19 Vaccine Bivalent Booster 5yrs & up 09/13/2021, 03/11/2022   Pfizer(Comirnaty)Fall Seasonal Vaccine 12 years and older 08/18/2022, 07/09/2023   Pneumococcal Conjugate-13 12/29/2018   Pneumococcal Polysaccharide-23 10/15/2016, 10/13/2019   Tdap 01/27/2017   Vaccinia,smallpox Monkeypox Vaccine Live,pf 06/06/2021, 07/04/2021   Zoster Recombinant(Shingrix) 06/13/2020, 08/12/2020, 07/27/2021, 08/27/2021    Past Medical History:  Diagnosis Date   Anemia    Aortic atherosclerosis (HCC)    Arthritis    Ascending aorta dilation (HCC) 02/17/2023   Limited TTE 02/17/23: no effusion, s/p MV repair w trivial residual MR, asc aorta 39 mm, mod TR,  severe LAE   Asthma    Cancer (HCC)    renal cyst   Chronic diastolic CHF (congestive heart failure) (HCC) 01/06/2017   Echo 7/16 [facility] in [place], [place]) Mild AI, mild LAE, mild concentric LVH, EF 55, normal wall motion,  mild to moderate MR, mild PI, RVSP 55 // Echo 10/09/16 ([facility]):  Moderate LVH, grade 2 diastolic dysfunction, mild MR, moderate LAE    Dysrhythmia    paroxysmal afib/flutter   Esophagitis    ESRD (end stage renal disease) on dialysis [facility])    Dialysis Mon Wed Fri   GERD (gastroesophageal reflux disease)    Gout    no current problems   Heart murmur    had it since he was a baby- pt had echo 10/01/23   Hepatitis    Hep B   HIV (human immunodeficiency virus infection) (HCC)    HLD (hyperlipidemia)    Hypertension    Hypothyroidism    Mitral regurgitation    Myocardial infarction (HCC)    Neuropathy    Sleep apnea    does not use c-pap   Stroke [facility])    Wears glasses    Wears partial dentures     Tobacco History: Social History   Tobacco Use  Smoking Status Former   Current packs/day: 0.00   Average packs/day: 1 pack/day for 18.0 years (18.0 ttl pk-yrs)   Types: Cigarettes   Start date: 34   Quit date: 2000   Years since quitting: 25.5  Smokeless Tobacco Never   Counseling given: Not Answered   Outpatient Medications Prior to Visit  Medication Sig Dispense Refill   allopurinol  (ZYLOPRIM ) 100 MG tablet TAKE ONE TABLET BY MOUTH DAILY 90 tablet 0   amLODipine  (NORVASC ) 2.5 MG tablet Take 1 tablet (2.5 mg total) by mouth daily at 8 pm. 90 tablet 3   azelastine  (ASTELIN ) 0.1 % nasal spray Place 2 sprays into both nostrils 2 (two) times daily as needed for allergies. 30 mL 0   B Complex-C-Zn-Folic Acid  (DIALYVITE  800/ZINC  PO) Take 1 tablet by mouth daily.     bictegravir-emtricitabine -tenofovir  AF (BIKTARVY ) 50-200-25 MG TABS tablet Take 1 tablet by mouth daily. 30 tablet 11   cetirizine  (ZYRTEC ) 10 MG tablet TAKE ONE TABLET BY MOUTH DAILY AT 9 a.m. 90 tablet 1   cinacalcet  (SENSIPAR ) 90 MG tablet Take 90 mg by mouth daily.     diclofenac  Sodium (VOLTAREN ) 1 % GEL Apply 2 g topically 4 (four) times daily. Apply to chest pain when lidocaine  patch not in place (Patient  taking differently: Apply 2 g topically 4 (four) times daily as needed (pain).) 150 g 0   ezetimibe  (ZETIA ) 10 MG tablet Take 10 mg by mouth daily.     famotidine  (PEPCID ) 20 MG tablet Take 1 tablet (20 mg total) by mouth 2 (two) times daily. 180 tablet 3   ferric citrate  (AURYXIA ) 1 GM 210 MG(Fe) tablet Take 420 mg by mouth See admin instructions. Take 420 mg by mouth three times a day with meals and 210 mg with each snack     fluticasone  (FLOVENT  HFA) 110 MCG/ACT inhaler Inhale 2 puffs into the lungs 2 (two) times daily as needed (for flares).     gabapentin  (NEURONTIN ) 300 MG capsule Take 1 capsule (300 mg total) by mouth daily as needed (pain/neuropathy).     HYDROcodone -acetaminophen  (NORCO/VICODIN) 5-325 MG tablet Take 1 tablet by mouth every 6 (six) hours as needed. 12 tablet 0   hydrocortisone  (ANUSOL -HC) 25  MG suppository Place 1 suppository (25 mg total) rectally at bedtime. 10 suppository 0   ipratropium (ATROVENT ) 0.06 % nasal spray instill TWO SPRAYS IN EACH NOSTRIL 2-3 times DAILY AS NEEDED 60 mL 2   iron  sucrose (VENOFER ) 20 MG/ML injection Inject into the vein 3 (three) times a week.     lactulose  (CHRONULAC ) 10 GM/15ML solution Take 45 mLs (30 g total) by mouth daily as needed. Take 15-30 ml up to twice daily for constipation (Patient taking differently: Take 40 g by mouth daily as needed.) 1800 mL 11   levalbuterol  (XOPENEX  HFA) 45 MCG/ACT inhaler Inhale 2 puffs into the lungs every 4 (four) hours as needed for wheezing. 15 g 1   levothyroxine  (SYNTHROID ) 25 MCG tablet Take 1 tablet (25 mcg total) by mouth daily before breakfast. 90 tablet 1   lubiprostone  (AMITIZA ) 24 MCG capsule Take 1 capsule (24 mcg total) by mouth 2 (two) times daily with a meal. NEEDS APPT FOR FURTHER REFILLS 180 capsule 0   Methoxy PEG-Epoetin  Beta (MIRCERA IJ) every 30 (thirty) days.     metoprolol  tartrate (LOPRESSOR ) 25 MG tablet Take 1 tablet (25 mg total) by mouth 2 (two) times daily. 90 tablet 3    Olopatadine  HCl (PATADAY ) 0.7 % SOLN Place 1 drop into both eyes 2 (two) times daily as needed (allergies). 2.5 mL 5   pantoprazole  (PROTONIX ) 40 MG tablet Take 1 tablet (40 mg total) by mouth daily. 30 tablet 5   rOPINIRole  (REQUIP ) 1 MG tablet Take 1 tablet (1 mg total) by mouth at bedtime. 90 tablet 2   rosuvastatin  (CRESTOR ) 10 MG tablet Take 1 tablet (10 mg total) by mouth daily. 90 tablet 1   sevelamer  carbonate (RENVELA ) 800 MG tablet Take 2,400 mg by mouth daily.     Spacer/Aero-Holding Chambers DEVI 1 Device by Does not apply route 2 (two) times a day. 1 each 1   SYMBICORT  160-4.5 MCG/ACT inhaler Inhale 2 puffs into the lungs in the morning and at bedtime. (Patient taking differently: Inhale 2 puffs into the lungs 2 (two) times daily as needed (Asthma).) 10.2 g 5   tiZANidine  (ZANAFLEX ) 2 MG tablet Take 1 tablet (2 mg total) by mouth 2 (two) times daily as needed for muscle spasms. 30 tablet 0   Vitamin D , Ergocalciferol , (DRISDOL ) 1.25 MG (50000 UNIT) CAPS capsule Take 1 capsule (50,000 Units total) by mouth every Tuesday. 12 capsule 0   warfarin (COUMADIN ) 5 MG tablet Take 1 tablet to 2 tablets by mouth daily as prescribed by coumadin  clinic (Patient taking differently: Take 5-10 mg by mouth See admin instructions. Take 5mg  by mouth once daily on Monday, Wednesday and Friday. Take 10mg  by mouth all other days of the week.) 140 tablet 0   amiodarone  (PACERONE ) 200 MG tablet Take 200 mg by mouth daily. (Patient not taking: Reported on 05/26/2024)     No facility-administered medications prior to visit.     Review of Systems:   Constitutional:   No  weight loss, night sweats,  Fevers, chills, fatigue, or  lassitude.  HEENT:   No headaches,  Difficulty swallowing,  Tooth/dental problems, or  Sore throat,                No sneezing, itching, ear ache, +nasal congestion, post nasal drip,   CV:  No chest pain,  Orthopnea, PND, swelling in lower extremities, anasarca, dizziness,  palpitations, syncope.   GI  No heartburn, indigestion, abdominal pain, nausea, vomiting, diarrhea, change in  bowel habits, loss of appetite, bloody stools.   Resp:  No wheezing.  No chest wall deformity  Skin: no rash or lesions.  GU: no dysuria, change in color of urine, no urgency or frequency.  No flank pain, no hematuria   MS:  No joint pain or swelling.  No decreased range of motion.  No back pain.    Physical Exam  BP (!) 147/89   Pulse 78   Temp 97.8 F (36.6 C) (Temporal)   Ht 5' 10 (1.778 m)   Wt 205 lb (93 kg)   SpO2 99%   BMI 29.41 kg/m   GEN: A/Ox3; pleasant , NAD, well nourished    HEENT:  [place]/AT, NOSE-clear, THROAT-clear, no lesions, no postnasal drip or exudate noted. Normal tongue movement   NECK:  Supple w/ fair ROM; no JVD; normal carotid impulses w/o bruits; no thyromegaly or nodules palpated; no lymphadenopathy.    RESP  Clear  P & A; w/o, wheezes/ rales/ or rhonchi. no accessory muscle use, no dullness to percussion  CARD:  RRR, no m/r/g, no peripheral edema, pulses intact, no cyanosis or clubbing.  GI:   Soft & nt; nml bowel sounds; no organomegaly or masses detected.   Musco: Warm bil, no deformities or joint swelling noted.   Neuro: alert, no focal deficits noted.    Skin: Warm, no lesions or rashes    Lab Results:       Imaging: No results found.  Administration History     None           No data to display          No results found for: NITRICOXIDE      Assessment & Plan:   No problem-specific Assessment & Plan notes found for this encounter.  Assessment and Plan Assessment & Plan Obstructive sleep apnea, status post Inspire device implantation   Obstructive sleep apnea is managed with an Inspire device. He occasionally forgets to activate the device but generally tolerates it well. Continue weekly incremental increases in stimulation level as tolerated. Set an alarm to remind him to activate the Inspire  device nightly. A titration study at the sleep lab is ordered for September. Schedule a follow-up visit in four weeks for a readiness check.  Pulmonary nodule, right middle lobe, under surveillance   A very tiny nodule in the right middle lobe was identified on a CT scan. It is too small for biopsy or PET scan. Order a follow-up CT scan in one year to monitor the nodule.  Chronic cough   Chronic cough may be related to asthma or previous smoking history. No acute findings on the recent CT scan. He reports coughing with deep breaths. Currently managed with Symbicort  and albuterol  inhalers. No significant sinus drainage is reported. Continue Symbicort  and albuterol  inhalers. Consider using Delsym for cough and Mucinex DM for mucus and congestion. Follow up with an asthma and allergy specialist if symptoms persist.    Plan  Patient Instructions  Increase Inspire by 1 level each week as tolerated.  Use Inspire each night and with naps.  Liquid Mucinex DM Twice daily  As needed  cough/congestion  Zyrtec  10mg  daily As needed  drainage.  Follow up with Asthma and Allergy as discussed.  Follow up in 4 weeks and As needed       Madelin Stank, NP 05/26/2024

## 2024-05-26 NOTE — Patient Instructions (Addendum)
 Increase Inspire by 1 level each week as tolerated.  Use Inspire each night and with naps.  Liquid Mucinex DM Twice daily  As needed  cough/congestion  Zyrtec  10mg  daily As needed  drainage.  Follow up with Asthma and Allergy as discussed.  Follow up in 4 weeks and As needed

## 2024-06-01 ENCOUNTER — Encounter: Payer: Self-pay | Admitting: Nurse Practitioner

## 2024-06-01 ENCOUNTER — Ambulatory Visit (INDEPENDENT_AMBULATORY_CARE_PROVIDER_SITE_OTHER)
Admission: RE | Admit: 2024-06-01 | Discharge: 2024-06-01 | Disposition: A | Discharge status: Home / Self Care | Source: Ambulatory Visit | Attending: Nurse Practitioner | Admitting: Nurse Practitioner

## 2024-06-01 ENCOUNTER — Telehealth: Payer: Self-pay

## 2024-06-01 ENCOUNTER — Ambulatory Visit (INDEPENDENT_AMBULATORY_CARE_PROVIDER_SITE_OTHER): Admitting: Nurse Practitioner

## 2024-06-01 VITALS — BP 144/72 | HR 51 | Temp 98.1°F | Ht 70.0 in | Wt 202.0 lb

## 2024-06-01 DIAGNOSIS — R0602 Shortness of breath: Secondary | ICD-10-CM

## 2024-06-01 DIAGNOSIS — R079 Chest pain, unspecified: Secondary | ICD-10-CM | POA: Diagnosis not present

## 2024-06-01 DIAGNOSIS — R051 Acute cough: Secondary | ICD-10-CM | POA: Diagnosis not present

## 2024-06-01 LAB — CBC
HCT: 33.3 % — ABNORMAL LOW (ref 39.0–52.0)
Hemoglobin: 11.4 g/dL — ABNORMAL LOW (ref 13.0–17.0)
MCHC: 34.1 g/dL (ref 30.0–36.0)
MCV: 99.3 fl (ref 78.0–100.0)
Platelets: 233 K/uL (ref 150.0–400.0)
RBC: 3.35 Mil/uL — ABNORMAL LOW (ref 4.22–5.81)
RDW: 15 % (ref 11.5–15.5)
WBC: 5 K/uL (ref 4.0–10.5)

## 2024-06-01 LAB — COMPREHENSIVE METABOLIC PANEL WITH GFR
ALT: 12 U/L (ref 0–53)
AST: 10 U/L (ref 0–37)
Albumin: 4.9 g/dL (ref 3.5–5.2)
Alkaline Phosphatase: 86 U/L (ref 39–117)
BUN: 23 mg/dL (ref 6–23)
CO2: 31 meq/L (ref 19–32)
Calcium: 10.4 mg/dL (ref 8.4–10.5)
Chloride: 96 meq/L (ref 96–112)
Creatinine, Ser: 6.23 mg/dL (ref 0.40–1.50)
GFR: 9.26 mL/min — CL (ref >60.00)
Glucose, Bld: 74 mg/dL (ref 70–99)
Potassium: 3.3 meq/L — ABNORMAL LOW (ref 3.5–5.1)
Sodium: 139 meq/L (ref 135–145)
Total Bilirubin: 0.6 mg/dL (ref 0.2–1.2)
Total Protein: 8.7 g/dL — ABNORMAL HIGH (ref 6.0–8.3)

## 2024-06-01 LAB — SEDIMENTATION RATE: Sed Rate: 40 mm/h — ABNORMAL HIGH (ref 0–20)

## 2024-06-01 LAB — HIGH SENSITIVITY CRP: CRP, High Sensitivity: 18.56 mg/L — ABNORMAL HIGH (ref 0.000–5.000)

## 2024-06-01 NOTE — Assessment & Plan Note (Signed)
 He continues in the limits.  Pending CBC CMP and ESR and sed rate.  Pending chest x-ray query possible infection.  Not reproducible in office

## 2024-06-01 NOTE — Patient Instructions (Addendum)
 Nice to see you today  I will be in touch with the labs and xray once I have it Follow up if you do not improve  Follow up in 3-6 months for your physical

## 2024-06-01 NOTE — Assessment & Plan Note (Signed)
 Pending chest x-ray and basic labs

## 2024-06-01 NOTE — Telephone Encounter (Signed)
 Sending note to CHRISTELLA Crandall NP.

## 2024-06-01 NOTE — Telephone Encounter (Signed)
  CRITICAL VALUE:Creatinine 6.23 and GFR 9.26  RECEIVER (on-site recipient of call):Cloteal Isaacson LPN  DATE & TIME NOTIFIED: 4:13 PM 06/01/24   MESSENGER (representative from lab):Saa with [facility]  MD NOTIFIED: Lynwood Crandall, NP   TIME OF NOTIFICATION:4:13 PM  RESPONSE:

## 2024-06-01 NOTE — Progress Notes (Signed)
 Acute Office Visit  Subjective:     Patient ID: Albert Eaton, male    DOB: 26-Dec-1965, 58 y.o.   MRN: [id number]  Chief Complaint  Patient presents with   Chest Pain    Pt complains of flare up within the last few weeks. Pt states of chest pain and mucus coming up. Pt states he has a nodule in lungs.      Patient is in today for chest pain/ cough with a history of HTN, CHF, Mitral regurg, PAH, CVA, OSA, asthma, Crohns, esrd, RCC,  States that a few week ago he started having some chest pain that is on the right upper chest. States that it will hurt with deep breaths, coughing, sneezing and once with walking that happened once. States that  the pain is described as a Conservator, museum/gallery. States that he feels that the pain has gotten.  States that he is coughing a lot of stuff up. States that it is all day long. States that he is unable to get up  State that he is using his Symbicort  and has not had to use the albuterol . He is having the shortness of breath with leaning forward and when laying back    He dialysis M,W,F. Did a full session today.      Review of Systems  Constitutional:  Negative for chills and fever.  HENT:  Negative for congestion, ear discharge, ear pain, sinus pain and sore throat.   Respiratory:  Positive for cough, sputum production and shortness of breath.   Cardiovascular:  Positive for chest pain.  Neurological:  Negative for dizziness and headaches.        Objective:    BP (!) 144/72   Pulse (!) 51   Temp 98.1 F (36.7 C) (Oral)   Ht 5' 10 (1.778 m)   Wt 202 lb (91.6 kg)   SpO2 97%   BMI 28.98 kg/m  BP Readings from Last 3 Encounters:  06/01/24 (!) 144/72  05/26/24 (!) 147/89  04/25/24 (!) 134/48   Wt Readings from Last 3 Encounters:  06/01/24 202 lb (91.6 kg)  05/26/24 205 lb (93 kg)  04/25/24 203 lb 9.6 oz (92.4 kg)   SpO2 Readings from Last 3 Encounters:  06/01/24 97%  05/26/24 99%  04/25/24 99%      Physical Exam Vitals and nursing  note reviewed.  Constitutional:      Appearance: Normal appearance.  HENT:     Right Ear: Tympanic membrane, ear canal and external ear normal.     Left Ear: Tympanic membrane, ear canal and external ear normal.     Mouth/Throat:     Mouth: Mucous membranes are moist.     Pharynx: Oropharynx is clear.  Cardiovascular:     Rate and Rhythm: Normal rate and regular rhythm.     Heart sounds: Normal heart sounds.  Pulmonary:     Effort: Pulmonary effort is normal.     Breath sounds: Normal breath sounds.  Chest:     Chest wall: No tenderness.    Lymphadenopathy:     Cervical: No cervical adenopathy.  Neurological:     Mental Status: He is alert.     No results found for any visits on 06/01/24.      Assessment & Plan:   Problem List Items Addressed This Visit       Other   Shortness of breath   Ambiguous nature.  Patient is medically complex.  Pending chest x-ray EKG within normal  limits today.  Also pending basic blood work.  Patient has not had to use albuterol  inhaler.  Does endorse orthopnea but is able to lay flat on exam table without trouble.  Of note patient did have a full treatment dialysis today      Relevant Orders   EKG 12-Lead   DG Chest 2 View   Comprehensive metabolic panel with GFR   CBC   Chest pain - Primary   He continues in the limits.  Pending CBC CMP and ESR and sed rate.  Pending chest x-ray query possible infection.  Not reproducible in office      Relevant Orders   EKG 12-Lead   DG Chest 2 View   Comprehensive metabolic panel with GFR   CBC   High sensitivity CRP   Sedimentation rate   Acute cough   Pending chest x-ray and basic labs       No orders of the defined types were placed in this encounter.   Return in about 4 months (around 10/01/2024) for CPE and Labs.  Adina Crandall, NP

## 2024-06-01 NOTE — Assessment & Plan Note (Signed)
 Ambiguous nature.  Patient is medically complex.  Pending chest x-ray EKG within normal limits today.  Also pending basic blood work.  Patient has not had to use albuterol  inhaler.  Does endorse orthopnea but is able to lay flat on exam table without trouble.  Of note patient did have a full treatment dialysis today

## 2024-06-02 NOTE — Telephone Encounter (Signed)
 Noted. Patient has ESRD and is on hemodialysis

## 2024-06-03 ENCOUNTER — Ambulatory Visit: Payer: Self-pay | Admitting: Nurse Practitioner

## 2024-06-03 ENCOUNTER — Other Ambulatory Visit: Payer: Self-pay | Admitting: Allergy & Immunology

## 2024-06-03 MED ORDER — DOXYCYCLINE HYCLATE 100 MG PO TABS: 100.0000 mg | ORAL_TABLET | Freq: Two times a day (BID) | ORAL | 0 refills | Status: AC

## 2024-06-04 ENCOUNTER — Other Ambulatory Visit: Payer: Self-pay | Admitting: Cardiovascular Disease

## 2024-06-04 DIAGNOSIS — I480 Paroxysmal atrial fibrillation: Secondary | ICD-10-CM

## 2024-06-07 ENCOUNTER — Ambulatory Visit: Attending: Cardiovascular Disease

## 2024-06-07 DIAGNOSIS — Z7901 Long term (current) use of anticoagulants: Secondary | ICD-10-CM

## 2024-06-07 DIAGNOSIS — Z79899 Other long term (current) drug therapy: Secondary | ICD-10-CM | POA: Diagnosis not present

## 2024-06-07 DIAGNOSIS — I480 Paroxysmal atrial fibrillation: Secondary | ICD-10-CM | POA: Diagnosis not present

## 2024-06-07 DIAGNOSIS — R079 Chest pain, unspecified: Secondary | ICD-10-CM | POA: Insufficient documentation

## 2024-06-07 DIAGNOSIS — I4892 Unspecified atrial flutter: Secondary | ICD-10-CM

## 2024-06-07 DIAGNOSIS — R0602 Shortness of breath: Secondary | ICD-10-CM | POA: Insufficient documentation

## 2024-06-07 DIAGNOSIS — Z9889 Other specified postprocedural states: Secondary | ICD-10-CM

## 2024-06-07 LAB — POCT INR: INR: 3.3 — AB (ref 2.0–3.0)

## 2024-06-07 NOTE — Patient Instructions (Signed)
 continue taking 2 tablets daily, except 1 tablet every Monday and Friday.  Recheck INR in 4 weeks. Eat greens tonight. Coumadin  Clinic [phone]

## 2024-06-07 NOTE — Progress Notes (Signed)
 INR 3.3; Please see anticoagulation encounter

## 2024-06-08 ENCOUNTER — Emergency Department (HOSPITAL_COMMUNITY)
Admission: EM | Admit: 2024-06-08 | Discharge: 2024-06-08 | Disposition: A | Discharge status: Home / Self Care | Attending: Emergency Medicine | Admitting: Emergency Medicine

## 2024-06-08 ENCOUNTER — Emergency Department (HOSPITAL_COMMUNITY)

## 2024-06-08 ENCOUNTER — Encounter (HOSPITAL_COMMUNITY): Payer: Self-pay | Admitting: Emergency Medicine

## 2024-06-08 ENCOUNTER — Other Ambulatory Visit: Payer: Self-pay

## 2024-06-08 DIAGNOSIS — R079 Chest pain, unspecified: Secondary | ICD-10-CM

## 2024-06-08 LAB — PROTIME-INR
INR: 2.5 — ABNORMAL HIGH (ref 0.8–1.2)
Prothrombin Time: 27.8 s — ABNORMAL HIGH (ref 11.4–15.2)

## 2024-06-08 LAB — BASIC METABOLIC PANEL WITH GFR
Anion gap: 18 — ABNORMAL HIGH (ref 5–15)
BUN: 58 mg/dL — ABNORMAL HIGH (ref 6–20)
CO2: 23 mmol/L (ref 22–32)
Calcium: 9.7 mg/dL (ref 8.9–10.3)
Chloride: 96 mmol/L — ABNORMAL LOW (ref 98–111)
Creatinine, Ser: 11.9 mg/dL — ABNORMAL HIGH (ref 0.61–1.24)
GFR, Estimated: 4 mL/min — ABNORMAL LOW (ref >60)
Glucose, Bld: 105 mg/dL — ABNORMAL HIGH (ref 70–99)
Potassium: 3.5 mmol/L (ref 3.5–5.1)
Sodium: 137 mmol/L (ref 135–145)

## 2024-06-08 LAB — BRAIN NATRIURETIC PEPTIDE: B Natriuretic Peptide: 1898.3 pg/mL — ABNORMAL HIGH (ref 0.0–100.0)

## 2024-06-08 LAB — TROPONIN I (HIGH SENSITIVITY)
Troponin I (High Sensitivity): 78 ng/L — ABNORMAL HIGH (ref <18)
Troponin I (High Sensitivity): 79 ng/L — ABNORMAL HIGH (ref <18)

## 2024-06-08 LAB — CBC
HCT: 30.8 % — ABNORMAL LOW (ref 39.0–52.0)
Hemoglobin: 10.5 g/dL — ABNORMAL LOW (ref 13.0–17.0)
MCH: 34 pg (ref 26.0–34.0)
MCHC: 34.1 g/dL (ref 30.0–36.0)
MCV: 99.7 fL (ref 80.0–100.0)
Platelets: 264 K/uL (ref 150–400)
RBC: 3.09 MIL/uL — ABNORMAL LOW (ref 4.22–5.81)
RDW: 14.4 % (ref 11.5–15.5)
WBC: 5.9 K/uL (ref 4.0–10.5)
nRBC: 0 % (ref 0.0–0.2)

## 2024-06-08 MED ORDER — HYDROCODONE-ACETAMINOPHEN 5-325 MG PO TABS: 1.0000 | ORAL_TABLET | ORAL | 0 refills | Status: DC | PRN

## 2024-06-08 MED ORDER — HYDROCODONE-ACETAMINOPHEN 5-325 MG PO TABS
1.0000 | ORAL_TABLET | Freq: Once | ORAL | Status: AC
  Administered 2024-06-08 (×2): 1 via ORAL
  Filled 2024-06-08: qty 1

## 2024-06-08 NOTE — ED Triage Notes (Addendum)
 Pt in with central cp and sob worse while lying down - ongoing x 2 weeks. Hx of afib, CHF, HIV, MI. Takes coumadin  daily. Does MWF dialysis, last done on Monday

## 2024-06-08 NOTE — ED Provider Triage Note (Signed)
 Emergency Medicine Provider Triage Evaluation Note  Albert Eaton , a 58 y.o. male  was evaluated in triage.  Pt complains of chest pain and SOB.  States that symptoms are worse when he lies down.  On HD MWF, last dialyzed Monday.  Denies fever, has had some cough.  Review of Systems  Positive: Chest pain, SOB Negative: fever  Physical Exam  BP 128/87   Pulse 76   Temp 98.4 F (36.9 C)   Resp 17   Wt 91.6 kg   SpO2 100%   BMI 28.98 kg/m  Gen:   Awake, no distress   Resp:  Normal effort  MSK:   Moves extremities without difficulty  Other:    Medical Decision Making  Medically screening exam initiated at 12:37 AM.  Appropriate orders placed.  Albert Eaton was informed that the remainder of the evaluation will be completed by another provider, this initial triage assessment does not replace that evaluation, and the importance of remaining in the ED until their evaluation is complete.     Vicky Charleston, PA-C 06/08/24 [phone]

## 2024-06-08 NOTE — ED Provider Notes (Signed)
 [place] EMERGENCY DEPARTMENT AT [facility] Provider Note   CSN: [id number] Arrival date & time: 06/07/24  2348     Patient presents with: Chest Pain and Shortness of Breath   Albert Eaton is a 58 y.o. male.   Patient presents to the emergency department for evaluation of chest pain.  Patient reports that he has been experiencing pain for a couple of weeks.  Pain seems to happen mostly at night when he lies down, has kept him from sleeping well.  Reports that it does affect his breathing.  He also notices that the symptoms occur if he bends over.       Prior to Admission medications   Medication Sig Start Date End Date Taking? Authorizing Provider  HYDROcodone -acetaminophen  (NORCO/VICODIN) 5-325 MG tablet Take 1 tablet by mouth every 4 (four) hours as needed for moderate pain (pain score 4-6). 06/08/24  Yes Toy Eisemann, Lonni PARAS, MD  allopurinol  (ZYLOPRIM ) 100 MG tablet TAKE ONE TABLET BY MOUTH DAILY 03/11/24   [name], NP  amLODipine  (NORVASC ) 2.5 MG tablet Take 1 tablet (2.5 mg total) by mouth daily at 8 pm. 03/16/24   Wonda Sharper, MD  azelastine  (ASTELIN ) 0.1 % nasal spray Place 2 sprays into both nostrils 2 (two) times daily as needed for allergies. 04/01/23   Iva Marty Saltness, MD  B Complex-C-Zn-Folic Acid  (DIALYVITE  800/ZINC  PO) Take 1 tablet by mouth daily. 12/14/23   [provider]  bictegravir-emtricitabine -tenofovir  AF (BIKTARVY ) 50-200-25 MG TABS tablet Take 1 tablet by mouth daily. 07/28/23   Efrain Lamar ORN, MD  cetirizine  (ZYRTEC ) 10 MG tablet TAKE ONE TABLET BY MOUTH EVERY DAY AT 9.00 IN THE MORNING 06/03/24   Iva Marty Saltness, MD  cinacalcet  (SENSIPAR ) 90 MG tablet Take 90 mg by mouth daily. 10/08/23   [provider]  diclofenac  Sodium (VOLTAREN ) 1 % GEL Apply 2 g topically 4 (four) times daily. Apply to chest pain when lidocaine  patch not in place Patient taking differently: Apply 2 g topically 4 (four) times daily as needed  (pain). 09/14/22   Bryn Bernardino NOVAK, MD  Doxercalciferol  (HECTOROL  IV) 8 mcg. 03/18/24 03/17/25  [provider]  doxycycline  (VIBRA -TABS) 100 MG tablet Take 1 tablet (100 mg total) by mouth 2 (two) times daily for 7 days. 06/03/24 06/10/24  [name], NP  ELIQUIS  5 MG TABS tablet Take 5 mg by mouth 2 (two) times daily. 03/08/24   [provider]  ezetimibe  (ZETIA ) 10 MG tablet Take 10 mg by mouth daily. 02/10/24   [provider]  famotidine  (PEPCID ) 20 MG tablet Take 1 tablet (20 mg total) by mouth 2 (two) times daily. 11/13/22   Iva Marty Saltness, MD  ferric citrate  (AURYXIA ) 1 GM 210 MG(Fe) tablet Take 420 mg by mouth See admin instructions. Take 420 mg by mouth three times a day with meals and 210 mg with each snack    [provider]  fluticasone  (FLOVENT  HFA) 110 MCG/ACT inhaler Inhale 2 puffs into the lungs 2 (two) times daily as needed (for flares).    [provider]  gabapentin  (NEURONTIN ) 300 MG capsule Take 1 capsule (300 mg total) by mouth daily as needed (pain/neuropathy). 09/14/22   Bryn Bernardino NOVAK, MD  hydrocortisone  (ANUSOL -HC) 25 MG suppository Place 1 suppository (25 mg total) rectally at bedtime. 04/07/24   May, Deanna J, NP  ipratropium (ATROVENT ) 0.06 % nasal spray instill TWO SPRAYS IN EACH NOSTRIL 2-3 times DAILY AS NEEDED 03/10/24  Iva Marty Saltness, MD  iron  sucrose (VENOFER ) 20 MG/ML injection Inject into the vein 3 (three) times a week. 11/27/23 11/25/24  [provider]  iron  sucrose in sodium chloride  0.9 % 100 mL 50 mg. 03/25/24 03/24/25  [provider]  lactulose  (CHRONULAC ) 10 GM/15ML solution Take 45 mLs (30 g total) by mouth daily as needed. Take 15-30 ml up to twice daily for constipation Patient taking differently: Take 40 g by mouth daily as needed. 01/08/23   Pyrtle, Gordy HERO, MD  levalbuterol  (XOPENEX  HFA) 45 MCG/ACT inhaler INHALE TWO puffs into THE lungs EVERY FOUR HOURS AS NEEDED wheezing 06/03/24    Iva Marty Saltness, MD  levothyroxine  (SYNTHROID ) 25 MCG tablet Take 1 tablet (25 mcg total) by mouth daily before breakfast. 10/27/23   [name], NP  lubiprostone  (AMITIZA ) 24 MCG capsule Take 1 capsule (24 mcg total) by mouth 2 (two) times daily with a meal. NEEDS APPT FOR FURTHER REFILLS 10/19/23   Pyrtle, Gordy HERO, MD  Methoxy PEG-Epoetin  Beta (MIRCERA IJ) every 30 (thirty) days. 11/27/23 11/25/24  [provider]  metoprolol  tartrate (LOPRESSOR ) 25 MG tablet Take 1 tablet (25 mg total) by mouth 2 (two) times daily. 11/04/23   Wonda Sharper, MD  Olopatadine  HCl (PATADAY ) 0.7 % SOLN Place 1 drop into both eyes 2 (two) times daily as needed (allergies). 11/13/22   Iva Marty Saltness, MD  pantoprazole  (PROTONIX ) 40 MG tablet Take 1 tablet (40 mg total) by mouth daily. 05/06/24   Iva Marty Saltness, MD  rOPINIRole  (REQUIP ) 1 MG tablet Take 1 tablet (1 mg total) by mouth at bedtime. 10/26/23   Neda Jennet LABOR, MD  rosuvastatin  (CRESTOR ) 10 MG tablet Take 1 tablet (10 mg total) by mouth daily. 10/19/23   Cooper, Michael, MD  sevelamer  carbonate (RENVELA ) 800 MG tablet Take 2,400 mg by mouth daily. 04/22/23   [provider]  Spacer/Aero-Holding Raguel DEVI 1 Device by Does not apply route 2 (two) times a day. 03/01/19   Bobbitt, Elgin Pepper, MD  SYMBICORT  160-4.5 MCG/ACT inhaler Inhale 2 puffs into the lungs in the morning and at bedtime. Patient taking differently: Inhale 2 puffs into the lungs 2 (two) times daily as needed (Asthma). 07/01/23   Iva Marty Saltness, MD  tiZANidine  (ZANAFLEX ) 2 MG tablet Take 1 tablet (2 mg total) by mouth 2 (two) times daily as needed for muscle spasms. 07/07/23   [name], NP  Vitamin D , Ergocalciferol , (DRISDOL ) 1.25 MG (50000 UNIT) CAPS capsule Take 1 capsule (50,000 Units total) by mouth every Tuesday. 01/06/23   [name], NP  warfarin (COUMADIN ) 5 MG tablet TAKE 1 TO 2 TABLETS BY MOUTH DAILY AS DIRECTED BY COUMADIN  CLINIC  06/06/24   Wonda Sharper, MD    Allergies: Ace inhibitors    Review of Systems  Updated Vital Signs BP (!) 135/95 (BP Location: Right Arm)   Pulse 69   Temp 98 F (36.7 C) (Oral)   Resp 16   Wt 91.6 kg   SpO2 100%   BMI 28.98 kg/m   Physical Exam Vitals and nursing note reviewed.  Constitutional:      General: He is not in acute distress.    Appearance: He is well-developed.  HENT:     Head: Normocephalic and atraumatic.     Mouth/Throat:     Mouth: Mucous membranes are moist.  Eyes:     General: Vision grossly intact. Gaze aligned appropriately.     Extraocular Movements: Extraocular movements  intact.     Conjunctiva/sclera: Conjunctivae normal.  Cardiovascular:     Rate and Rhythm: Normal rate and regular rhythm.     Pulses: Normal pulses.     Heart sounds: Normal heart sounds, S1 normal and S2 normal. No murmur heard.    No friction rub. No gallop.  Pulmonary:     Effort: Pulmonary effort is normal. No respiratory distress.     Breath sounds: Normal breath sounds.  Abdominal:     Palpations: Abdomen is soft.     Tenderness: There is no abdominal tenderness. There is no guarding or rebound.     Hernia: No hernia is present.  Musculoskeletal:        General: No swelling.     Cervical back: Full passive range of motion without pain, normal range of motion and neck supple. No pain with movement, spinous process tenderness or muscular tenderness. Normal range of motion.     Right lower leg: No edema.     Left lower leg: No edema.  Skin:    General: Skin is warm and dry.     Capillary Refill: Capillary refill takes less than 2 seconds.     Findings: No ecchymosis, erythema, lesion or wound.  Neurological:     Mental Status: He is alert and oriented to person, place, and time.     GCS: GCS eye subscore is 4. GCS verbal subscore is 5. GCS motor subscore is 6.     Cranial Nerves: Cranial nerves 2-12 are intact.     Sensory: Sensation is intact.     Motor: Motor  function is intact. No weakness or abnormal muscle tone.     Coordination: Coordination is intact.  Psychiatric:        Mood and Affect: Mood normal.        Speech: Speech normal.        Behavior: Behavior normal.     (all labs ordered are listed, but only abnormal results are displayed) Labs Reviewed  BASIC METABOLIC PANEL WITH GFR - Abnormal; Notable for the following components:      Result Value   Chloride 96 (*)    Glucose, Bld 105 (*)    BUN 58 (*)    Creatinine, Ser 11.90 (*)    GFR, Estimated 4 (*)    Anion gap 18 (*)    All other components within normal limits  CBC - Abnormal; Notable for the following components:   RBC 3.09 (*)    Hemoglobin 10.5 (*)    HCT 30.8 (*)    All other components within normal limits  PROTIME-INR - Abnormal; Notable for the following components:   Prothrombin Time 27.8 (*)    INR 2.5 (*)    All other components within normal limits  BRAIN NATRIURETIC PEPTIDE - Abnormal; Notable for the following components:   B Natriuretic Peptide 1,898.3 (*)    All other components within normal limits  TROPONIN I (HIGH SENSITIVITY) - Abnormal; Notable for the following components:   Troponin I (High Sensitivity) 78 (*)    All other components within normal limits  TROPONIN I (HIGH SENSITIVITY) - Abnormal; Notable for the following components:   Troponin I (High Sensitivity) 79 (*)    All other components within normal limits    EKG: EKG Interpretation Date/Time:  Wednesday June 08 2024 00:11:13 EDT Ventricular Rate:  79 PR Interval:  180 QRS Duration:  98 QT Interval:  462 QTC Calculation: 529 R Axis:   64  Text Interpretation: Sinus rhythm with Premature supraventricular complexes Prolonged QT Abnormal ECG When compared with ECG of 02-Mar-2024 09:13, No significant change since last tracing Confirmed by Haze Lonni PARAS [phone]) on 06/08/2024 5:42:17 AM  Radiology: [facility] Chest 2 View Result Date: 06/08/2024 CLINICAL DATA:  Chest pain,  dyspnea EXAM: CHEST - 2 VIEW COMPARISON:  None Available. FINDINGS: Lungs are clear. No pneumothorax or pleural effusion. Mitral valve replacement has been performed. Cardiac size is mildly enlarged. Pulmonary vascularity is normal. Hypoglossal nerve stimulator noted overlying the right hemithorax. IMPRESSION: 1. Mild cardiomegaly. Electronically Signed   By: Dorethia Molt M.D.   On: 06/08/2024 01:14     Procedures   Medications Ordered in the ED  HYDROcodone -acetaminophen  (NORCO/VICODIN) 5-325 MG per tablet 1 tablet (has no administration in time range)                                    Medical Decision Making Amount and/or Complexity of Data Reviewed Labs: ordered. Radiology: ordered.  Risk Prescription drug management.   Differential Diagnosis considered includes, but not limited to: STEMI; NSTEMI; myocarditis; pericarditis; pulmonary embolism; aortic dissection; pneumothorax; pneumonia; gastritis; musculoskeletal pain  Patient presents with positional chest pain.  Patient reports pain when he lies down or when he bends over.  EKG unchanged from prior.  No signs of pericarditis.  Patient is adequately anticoagulated, no history of clotting disorder.  He takes Coumadin  secondary to A-fib.  Doubt PE.  Patient not hypoxic or tachycardic.  Patient has chronically elevated troponins.  His troponins today were plateaued at his normal elevation, no signs of acute coronary syndrome.  Patient does have a history of cardiac arrest.  He reports that he had many broken ribs when he received CPR 2 years ago.  I suspect some of his pain is secondary to chest wall pain.  He does have a history of reflux as well.  He has not been taking his Protonix .  I suggest he takes the Protonix  daily and we will give him analgesia.  Cardiology referral for follow-up has been placed.  Patient will be discharged, dialysis today.  Given return precautions.     Final diagnoses:  Chest pain, unspecified type     ED Discharge Orders          Ordered    Ambulatory referral to Cardiology       Comments: If you have not heard from the Cardiology office within the next 72 hours please call [phone].   06/08/24 0616    HYDROcodone -acetaminophen  (NORCO/VICODIN) 5-325 MG tablet  Every 4 hours PRN        06/08/24 0618               Haze Lonni PARAS, MD 06/08/24 [phone]

## 2024-06-08 NOTE — Discharge Instructions (Signed)
 Your workup today was reassuring.  I think your pain is coming from the chest wall and I will treated with pain medication.  Make sure that you are taking the pantoprazole  (Protonix ) antacid that was prescribed previously for you.  I have placed a referral for cardiology to contact you for follow-up in the office.

## 2024-06-09 ENCOUNTER — Telehealth: Payer: Self-pay | Admitting: Cardiovascular Disease

## 2024-06-09 NOTE — Telephone Encounter (Signed)
 Patient reports he has SOB and heart racing hat is not new, but it has gotten worse over the last couple weeks.  Happens when he lies down to go to bed or bending over at any time such as to do laundry.     When he takes metoprolol  each time the racing heart beat gets worse.  Went to ER last night - was checked for MI, stroke.  Said it might be muscular and gave hydrocodone .  He took it last night and made zero difference in symptoms.   CXR  at ER, no acute process.  Has hemodialysis regularly but no improvement after treatments.  Sees pulmonary for sleep.  Has Inspire device placed recently.  He doesn't know recall if symptoms were there before the [facility] device placement.  He said it had to sit inactive for the first 4 weeks and then it started.  He is going up a notch each week.  I asked him to contact pulmonary to see if that could be the reason for symptoms and to let us  know what he's told.   I scheduled an ER follow up appointment with DOD on 06/15/24.

## 2024-06-09 NOTE — Telephone Encounter (Signed)
 Pt c/o Shortness Of Breath: STAT if SOB developed within the last 24 hours or pt is noticeably SOB on the phone  1. Are you currently SOB (can you hear that pt is SOB on the phone)? Yes  2. How long have you been experiencing SOB? Last few weeks  3. Are you SOB when sitting or when up moving around? Pt loses breath when laying down and sometimes when moving  4. Are you currently experiencing any other symptoms? Chest pain  Pt went to hospital last night

## 2024-06-10 ENCOUNTER — Other Ambulatory Visit: Payer: Self-pay | Admitting: Allergy & Immunology

## 2024-06-10 ENCOUNTER — Other Ambulatory Visit: Payer: Self-pay | Admitting: Nurse Practitioner

## 2024-06-10 ENCOUNTER — Other Ambulatory Visit: Payer: Self-pay | Admitting: Cardiovascular Disease

## 2024-06-10 NOTE — Telephone Encounter (Signed)
 Refill request

## 2024-06-10 NOTE — Telephone Encounter (Signed)
 Prescription refill request for Eliquis  received. Indication:afib Last office visit:3/25 Scr:11.90  8/25 Age: 58 Weight:91.6  kg  Prescription refilled

## 2024-06-15 ENCOUNTER — Encounter: Payer: Self-pay | Admitting: Cardiology

## 2024-06-15 ENCOUNTER — Ambulatory Visit: Attending: Cardiology | Admitting: Cardiology

## 2024-06-15 VITALS — BP 138/88 | HR 93 | Ht 70.0 in | Wt 203.6 lb

## 2024-06-15 DIAGNOSIS — I10 Essential (primary) hypertension: Secondary | ICD-10-CM | POA: Diagnosis not present

## 2024-06-15 DIAGNOSIS — R0602 Shortness of breath: Secondary | ICD-10-CM | POA: Diagnosis not present

## 2024-06-15 DIAGNOSIS — G4733 Obstructive sleep apnea (adult) (pediatric): Secondary | ICD-10-CM

## 2024-06-15 DIAGNOSIS — I2721 Secondary pulmonary arterial hypertension: Secondary | ICD-10-CM

## 2024-06-15 DIAGNOSIS — Z9889 Other specified postprocedural states: Secondary | ICD-10-CM

## 2024-06-15 DIAGNOSIS — I4892 Unspecified atrial flutter: Secondary | ICD-10-CM | POA: Diagnosis not present

## 2024-06-15 DIAGNOSIS — I25118 Atherosclerotic heart disease of native coronary artery with other forms of angina pectoris: Secondary | ICD-10-CM | POA: Diagnosis not present

## 2024-06-15 DIAGNOSIS — I959 Hypotension, unspecified: Secondary | ICD-10-CM

## 2024-06-15 DIAGNOSIS — N186 End stage renal disease: Secondary | ICD-10-CM

## 2024-06-15 DIAGNOSIS — R079 Chest pain, unspecified: Secondary | ICD-10-CM | POA: Diagnosis not present

## 2024-06-15 DIAGNOSIS — Z992 Dependence on renal dialysis: Secondary | ICD-10-CM

## 2024-06-15 DIAGNOSIS — I2089 Other forms of angina pectoris: Secondary | ICD-10-CM

## 2024-06-15 DIAGNOSIS — I5032 Chronic diastolic (congestive) heart failure: Secondary | ICD-10-CM

## 2024-06-15 MED ORDER — ISOSORBIDE MONONITRATE ER 30 MG PO TB24: 30.0000 mg | ORAL_TABLET | Freq: Every day | ORAL | 3 refills | Status: DC

## 2024-06-15 NOTE — Progress Notes (Incomplete)
 Cardiology Office Note:  .   Date:  06/15/2024  ID:  Albert Eaton, DOB 1965/11/28, MRN [id number] PCP: Albert Lynwood HERO, NP  [facility] Providers Cardiologist:  Albert Fell, MD Electrophysiologist:  Albert ONEIDA HOLTS, MD { Click to update primary MD,subspecialty MD or APP then REFRESH:1}    Chief Complaint  Patient presents with  . Shortness of Breath    DoD work in visit for symptoms of shortness of breath    Patient Profile: .     Albert Eaton is a *** 58 y.o. male *** with a PMH notable for *** who presents here for *** at the request of Albert Lynwood HERO, NP.  Coronary artery disease, nonobstructive  LHC 09/11/2022: LAD proximal 50, mid 50; OM2 30; RCA proximal 40 HFmrEF (heart failure with mildly reduced ejection fraction) : Initial EF berry MBR was 45 to 50%. ECHO: EF 50 to 55%.  Low normal function.  No RWMA.  Mildly dilated LV with mild concentric LVH.  Unable to assess diastolic parameters.  Mildly elevated PAP.  Severe LA dilation.  Mild MR with prosthetic angioplasty ring in place.  No MS.  Mild to moderate TR.  Mild AI.  Normal RAP.  (03/03/2024) Paroxysmal atrial fibrillation  No longer on amiodarone ,  Currently on Toprol  tartrate 25 mg twice daily and apixaban  5 mg twice daily Mitral regurgitation S/p MV Repair (min inv) 07/2022 ([name]) Echo 03/03/2024: Prosthetic annuloplasty ring present.  No MS with mild MR. Tricuspid regurgitation Hx of PEA arrest  Occurred during dialysis 08/2022 - ?initial rhythm SVT vs AFib Monitor 09/2022: NSR, A-fib burden <1% (controlled rate and RVR), no high-grade AV block, few short ventricular runs  Renal CA s/p L nephrectomy  ESRD on hemodialysis (MWF) - 2018 Hypertension -> on amlodipine  2.5 mg daily along with metoprolol  tartrate 25 mg daily Hyperlipidemia on rosuvastatin  10 mg daily Aortic atherosclerosis Hypothyroidism on Synthroid  25 mcg daily OSA  HIV Hep B Pre-Mitral Valve Repair Dopplers 08/19/2022:  Bilateral ICA with minimal wall thickening      Albert Eaton was seen by [name] from EP on 06/11/2023.  Noted that there was progression from atrial tachycardia to clearly atrial flutter.  Anticoagulation recommended.  After initially being on Eliquis , he was switched from warfarin because of concerns for for ability to reverse anticoagulation in consideration of a possible urgent renal transplant operation.  BP was elevated. y NYHA I-II CHF.  His last visit with Dr. Fell was on October 29, 2023) he is doing well from a cardiac perspective.  Apparently was listed on the kidney transplant at [facility] and [facility].  Both recommended coming off apixaban  and transitioning to warfarin.  No significant changes in symptoms.  Note no chest pain or dyspnea.  Occasional palpitations but not significant.  No lightheadedness dizziness or syncope.  On HD Monday Wednesday Friday.  No otherwise PND, orthopnea or edema.  He went to the [facility] on August 13 with chest pain over the past several weeks.-Mostly at night when he lies down keeping him sleeping.  Also affects his breathing.  Symptoms also worse with bending over. => BNP was roughly 1900 and troponins were elevated but flat at 78 and 79.  EKG showed sinus rhythm with PACs.  Not felt to have signs of pericarditis.  On warfarin for A-fib borderline therapeutic therefore less likely have PE.  Not hypoxic or tachycardic.  Troponins are chronically elevated.  Not likely ACS.  Chest pain felt to be potentially related to chest wall pain. = Recommended cardiology follow-up.>   Subjective  Discussed the use of AI scribe software for clinical note transcription with the patient, who gave verbal consent to proceed.  History of Present Illness  Albert Eaton is a 58 year old male with atrial fibrillation and coronary artery disease who presents with shortness of breath and orthopnea.  He experiences significant shortness of  breath, particularly when lying down, which feels like suffocation. He has to sit up to sleep and experiences breathlessness when bending down or raising his arms above his head. These symptoms have worsened over the past few weeks, impacting his ability to perform daily activities such as walking quickly. No swelling is noted.  He has a history of atrial fibrillation and underwent mitral valve repair in 2023. He was previously on amiodarone  but is now taking metoprolol  25 mg twice daily. He also takes amlodipine  2.5 mg daily and Crestor  10 mg daily for cardiac health. He is on warfarin for anticoagulation, having switched from Eliquis  due to potential interactions with future transplant procedures. His INR was recently 3.3, and he is currently taking 10 mg of warfarin daily.  He has been on dialysis for seven years and reports issues with fluid removal during sessions, leading to drops in blood pressure. His dry weight has increased slightly, and he is unsure about his current weight due to fluctuations. He has a history of high blood pressure prior to dialysis.  He uses an Inspire device for sleep apnea, which is still being adjusted to optimal settings. He experienced a cardiac arrest in 2023, resulting in broken ribs and sternum, causing ongoing chest pain. He recently discovered a pulmonary nodule and has a CT scan scheduled.  His echocardiogram in May showed an ejection fraction of 50-55%, with mild mitral valve regurgitation and a severely dilated left atrium. He has slightly elevated pulmonary pressures.     Objective   Current Meds  Medication Sig  . allopurinol  (ZYLOPRIM ) 100 MG tablet TAKE ONE TABLET BY MOUTH EVERY DAY  . amLODipine  (NORVASC ) 2.5 MG tablet Take 1 tablet (2.5 mg total) by mouth daily at 8 pm.  . azelastine  (ASTELIN ) 0.1 % nasal spray place TWO SPRAY IN EACH NOSTRIL TWICE DAILY  . B Complex-C-Zn-Folic Acid  (DIALYVITE  800/ZINC  PO) Take 1 tablet by mouth daily.  .  bictegravir-emtricitabine -tenofovir  AF (BIKTARVY ) 50-200-25 MG TABS tablet Take 1 tablet by mouth daily.  . cetirizine  (ZYRTEC ) 10 MG tablet TAKE ONE TABLET BY MOUTH EVERY DAY AT 9.00 IN THE MORNING  . cinacalcet  (SENSIPAR ) 90 MG tablet Take 90 mg by mouth daily.  . diclofenac  Sodium (VOLTAREN ) 1 % GEL Apply 2 g topically 4 (four) times daily. Apply to chest pain when lidocaine  patch not in place  . Doxercalciferol  (HECTOROL  IV) 8 mcg.  . ezetimibe  (ZETIA ) 10 MG tablet Take 10 mg by mouth daily.  . famotidine  (PEPCID ) 20 MG tablet Take 1 tablet (20 mg total) by mouth 2 (two) times daily.  . ferric citrate  (AURYXIA ) 1 GM 210 MG(Fe) tablet Take 420 mg by mouth See admin instructions. Take 420 mg by mouth three times a day with meals and 210 mg with each snack  . fluticasone  (FLOVENT  HFA) 110 MCG/ACT inhaler Inhale 2 puffs into the lungs 2 (two) times daily as needed (for flares).  . gabapentin  (NEURONTIN ) 300 MG capsule Take 1 capsule (300 mg total) by mouth daily as needed (pain/neuropathy).  . hydrocortisone  (ANUSOL -HC)  25 MG suppository Place 1 suppository (25 mg total) rectally at bedtime.  SABRA ipratropium (ATROVENT ) 0.06 % nasal spray instill TWO SPRAYS IN EACH NOSTRIL 2-3 times DAILY AS NEEDED  . iron  sucrose (VENOFER ) 20 MG/ML injection Inject into the vein 3 (three) times a week.  . iron  sucrose in sodium chloride  0.9 % 100 mL 50 mg.  . lactulose  (CHRONULAC ) 10 GM/15ML solution Take 45 mLs (30 g total) by mouth daily as needed. Take 15-30 ml up to twice daily for constipation  . levalbuterol  (XOPENEX  HFA) 45 MCG/ACT inhaler INHALE TWO puffs into THE lungs EVERY FOUR HOURS AS NEEDED wheezing  . levothyroxine  (SYNTHROID ) 25 MCG tablet Take 1 tablet (25 mcg total) by mouth daily before breakfast.  . lubiprostone  (AMITIZA ) 24 MCG capsule Take 1 capsule (24 mcg total) by mouth 2 (two) times daily with a meal. NEEDS APPT FOR FURTHER REFILLS  . Methoxy PEG-Epoetin  Beta (MIRCERA IJ) every 30 (thirty)  days.  . metoprolol  tartrate (LOPRESSOR ) 25 MG tablet Take 1 tablet (25 mg total) by mouth 2 (two) times daily.  . Olopatadine  HCl (PATADAY ) 0.7 % SOLN Place 1 drop into both eyes 2 (two) times daily as needed (allergies).  . pantoprazole  (PROTONIX ) 40 MG tablet Take 1 tablet (40 mg total) by mouth daily.  . rOPINIRole  (REQUIP ) 1 MG tablet Take 1 tablet (1 mg total) by mouth at bedtime.  . rosuvastatin  (CRESTOR ) 10 MG tablet Take 1 tablet (10 mg total) by mouth daily.  . sevelamer  carbonate (RENVELA ) 800 MG tablet Take 2,400 mg by mouth daily.  SABRA Spacer/Aero-Holding Chambers DEVI 1 Device by Does not apply route 2 (two) times a day.  . SYMBICORT  160-4.5 MCG/ACT inhaler Inhale 2 puffs into the lungs in the morning and at bedtime.  . [DISCONTINUED] apixaban  (ELIQUIS ) 5 MG TABS tablet TAKE ONE TABLET BY MOUTH TWICE DAILY - Now on Warfarin  . [DISCONTINUED] HYDROcodone -acetaminophen  (NORCO/VICODIN) 5-325 MG tablet Take 1 tablet by mouth every 4 (four) hours as needed for moderate pain (pain score 4-6).  Warfarin 5 mg tab 2 tab daily - as directed    Studies Reviewed: SABRA   EKG Interpretation Date/Time:  Wednesday June 15 2024 15:24:32 EDT Ventricular Rate:  93 PR Interval:  178 QRS Duration:  88 QT Interval:  412 QTC Calculation: 512 R Axis:   74  Text Interpretation: Sinus rhythm with Premature atrial complexes Prolonged QT When compared with ECG of 08-Jun-2024 00:11, No significant change was found Confirmed by Anner Lenis ([id number]) on 06/15/2024 4:18:21 PM    INR: 1.9 (05/17/2024) INR: 3.3 (06/07/2024)  Lab Results  Component Value Date   CHOL 188 08/07/2023   HDL 36 (L) 08/07/2023   LDLCALC 122 (H) 08/07/2023   LDLDIRECT 23 08/22/2022   TRIG 169 (H) 08/07/2023   CHOLHDL 5.2 (H) 08/07/2023     Chemistry      Component Value Date/Time   NA 137 06/08/2024 0020   NA 141 07/25/2022 1244   K 3.5 06/08/2024 0020   CL 96 (L) 06/08/2024 0020   CO2 23 06/08/2024 0020   BUN 58 (H)  06/08/2024 0020   BUN 18 07/25/2022 1244   CREATININE 11.90 (H) 06/08/2024 0020   CREATININE 9.27 (H) 07/09/2023 0900      Component Value Date/Time   CALCIUM  9.7 06/08/2024 0020   ALKPHOS 86 06/01/2024 1250   AST 10 06/01/2024 1250   ALT 12 06/01/2024 1250   BILITOT 0.6 06/01/2024 1250   BILITOT <0.2 05/05/2023 1012  Lab Results  Component Value Date   WBC 5.9 06/08/2024   HGB 10.5 (L) 06/08/2024   HCT 30.8 (L) 06/08/2024   MCV 99.7 06/08/2024   PLT 264 06/08/2024    ECHO: EF 50 to 55%.  Low normal function.  No RWMA.  Mildly dilated LV with mild concentric LVH.  Unable to assess diastolic parameters.  Mildly elevated PAP.  Severe LA dilation.  Mild MR with prosthetic angioplasty ring in place.  No MS.  Mild to moderate TR.  Mild AI.  Normal RAP.  (03/03/2024) Echo: EF 45 to 50%.  Mild decreased function global HK.  Moderate RV dilation.  30 mm Medtronic prosthetic valve device to ring in place the mitral valve with no MS and trivial MR.  No change from prior study (09/09/2022)--initial echo post MVR. CATH: Prox LAD 50%, mid LAD 50% (.  OM2 30%, proximal RCA 40%.  Mildly elevated LVEDP.  No change since October 2023.  09/11/2022 Zio patch: Intermittent 2% Atrial Flutter (Rate Range 96 to 160 Bpm.  Longest Episode Was 34 Minutes with an Average Rate 137 Bpm.  3.5% PACs.  2.4% PVCs.SABRA  1 4 beat run of VT along with ventricular couplets, triplets/bigeminy or trigeminy..  (August 2024) Chest CT : Stable 5 mm noncalcified pulmonary nodule in the RML.  Pathologic fracture of mid sternum (from CPR) extensive multivessel coronary artery calcification.  Enlarged central pulmonary arteries suggesting PHTN.  (03/03/2024)  Risk Assessment/Calculations:    CHA2DS2-VASc Score = 5  {Confirm score is correct.  If not, click here to update score.  REFRESH note.  :1} This indicates a 7.2% annual risk of stroke. The patient's score is based upon: CHF History: 1 HTN History: 1 Diabetes History:  0 Stroke History: 2 Vascular Disease History: 1 Age Score: 0 Gender Score: 0   {This patient has a significant risk of stroke if diagnosed with atrial fibrillation.  Please consider VKA or DOAC agent for anticoagulation if the bleeding risk is acceptable.   You can also use the SmartPhrase .[email or web address] for documentation.   :[id number]}          Physical Exam:   VS:  BP 138/88   Pulse 93   Ht 5' 10 (1.778 m)   Wt 203 lb 9.6 oz (92.4 kg)   SpO2 98%   BMI 29.21 kg/m    Wt Readings from Last 3 Encounters:  06/15/24 203 lb 9.6 oz (92.4 kg)  06/08/24 202 lb (91.6 kg)  06/01/24 202 lb (91.6 kg)    GEN: Well nourished, well groomed in no acute distress; overweight/portal Line obese.  Healthy appearing  NECK: No JVD; No carotid bruits CARDIAC:  RRR with frequent ectopy;Normal S1 & S2; no murmurs, rubs, gallops RESPIRATORY:  Clear to auscultation without rales, wheezing or rhonchi ; nonlabored, good air movement. ABDOMEN: Soft, non-tender, non-distended EXTREMITIES:  No edema; No deformity : L UE fistula     ASSESSMENT AND PLAN: .    Problem List Items Addressed This Visit     Shortness of breath - Primary   Relevant Orders   EKG 12-Lead (Completed)   NM PET CT CARDIAC PERFUSION MULTI W/ABSOLUTE BLOODFLOW   Cardiac Stress Test: Informed Consent Details: Physician/Practitioner Attestation; Transcribe to consent form and obtain patient signature   Other Visit Diagnoses       Atypical angina (HCC)       Relevant Medications   isosorbide  mononitrate (IMDUR ) 30 MG 24 hr tablet   Other Relevant Orders  NM PET CT CARDIAC PERFUSION MULTI W/ABSOLUTE BLOODFLOW   Cardiac Stress Test: Informed Consent Details: Physician/Practitioner Attestation; Transcribe to consent form and obtain patient signature       Assessment and Plan Assessment & Plan Assessment and Plan Dyspnea with orthopnea and exertional symptoms Worsening dyspnea with orthopnea and exertional symptoms. Differential  includes heart failure and coronary artery disease progression. Echocardiogram shows low normal ejection fraction and severely dilated left atrium. EKG shows premature beats, no AFib. Stress PET preferred for coronary artery disease evaluation.   Severely dilated left atrium Severely dilated left atrium likely due to atrial fibrillation and hypertension. Chronic condition, no immediate intervention required.  Mitral valve repair with mild residual regurgitation Mitral valve repair in 2023 with mild residual regurgitation. Echocardiogram shows intact mitral valve ring, mild regurgitation, no significant murmurs. Valve function well-managed post-surgery.  Atrial fibrillation, not currently present Atrial fibrillation   End stage renal disease on hemodialysis On hemodialysis for seven years. Difficulty with fluid removal during dialysis causing blood pressure drops. Dry weight increased slightly, no significant weight changes or peripheral edema. Renal failure complicates anticoagulation management, necessitating warfarin use.  Hypertension Hypertension contributing to cardiac remodeling and left atrial dilation. Blood pressure management crucial in context of renal failure and cardiac history.  Hyperlipidemia On Crestor  10 mg daily for hyperlipidemia management. Important for cardiovascular risk reduction. - Continue Crestor  10 mg daily.  Recording duration: 23 minutes       Informed Consent   Shared Decision Making/Informed Consent{ The risks [chest pain, shortness of breath, cardiac arrhythmias, dizziness, blood pressure fluctuations, myocardial infarction, stroke/transient ischemic attack, nausea, vomiting, allergic reaction, radiation exposure, metallic taste sensation and life-threatening complications (estimated to be 1 in 10,000)], benefits (risk stratification, diagnosing coronary artery disease, treatment guidance) and alternatives of a cardiac PET stress test were discussed in  detail with Mr. Kilts and he agrees to proceed.      Follow-Up: No follow-ups on file.  Total time spent: *** min spent with patient + *** min spent charting = *** min    Signed, Alm MICAEL Clay, MD, MS Alm Clay, M.D., M.S. Interventional Chartered certified accountant  Pager # [phone]

## 2024-06-15 NOTE — Progress Notes (Addendum)
 Cardiology Office Note:  .   Date:  06/16/2024  ID:  Albert Eaton, DOB 08-22-1966, MRN [id number] PCP: [name], NP   [facility] Prov[facility] Cardiologist:  [name], MD Electrophysiologist:  [name], MD     Chief Complaint  Patient presents with   Shortness of Breath    DoD work in visit for symptoms of shortness of breath    Patient Profile: .     Suleman Gunning Cuartas is a borderline obese 58 y.o. male with a PMH reviewed below who presents here for evaluation of progressive exertional and positional dyspnea as well as chest discomfort.   PMH Coronary artery disease, nonobstructive  LHC 09/11/2022: LAD proximal 50, mid 50; OM2 30; RCA proximal 40 HFmrEF (heart failure with mildly reduced ejection fraction) : Initial EF berry MBR was 45 to 50%. ECHO: EF 50 to 55%.  Low normal function.  No RWMA.  Mildly dilated LV with mild concentric LVH.  Unable to assess diastolic parameters.  Mildly elevated PAP.  Severe LA dilation.  Mild MR with prosthetic angioplasty ring in place.  No MS.  Mild to moderate TR.  Mild AI.  Normal RAP.  (03/03/2024) Paroxysmal atrial fibrillation  No longer on amiodarone ,  Currently on Toprol  tartrate 25 mg twice daily and apixaban  5 mg twice daily Mitral regurgitation S/p MV Repair (min inv) 07/2022 ([name]) Echo 03/03/2024: Prosthetic annuloplasty ring present.  No MS with mild MR. Tricuspid regurgitation Hx of PEA arrest  Occurred during dialysis 08/2022 - ?initial rhythm SVT vs AFib Monitor 09/2022: NSR, A-fib burden <1% (controlled rate and RVR), no high-grade AV block, few short ventricular runs  Renal CA s/p L nephrectomy  ESRD on hemodialysis (MWF) - 2018 Hypertension -> on amlodipine  2.5 mg daily along with metoprolol  tartrate 25 mg daily Hyperlipidemia on rosuvastatin  10 mg daily Aortic atherosclerosis Hypothyroidism on Synthroid  25 mcg daily OSA  HIV Hep B Pre-Mitral Valve Repair Dopplers 08/19/2022: Bilateral ICA  with minimal wall thickening      Albert BIRCH Laborde was seen by [name] from EP on 06/11/2023.  Noted that there was progression from atrial tachycardia to clearly atrial flutter.  Anticoagulation recommended.  After initially being on Eliquis , he was switched from warfarin because of concerns for for ability to reverse anticoagulation in consideration of a possible urgent renal transplant operation.  BP was elevated. y NYHA I-II CHF.  His last visit with Dr. Fell was on October 29, 2023) he is doing well from a cardiac perspective.  Apparently was listed on the kidney transplant at [facility] and [facility].  Both recommended coming off apixaban  and transitioning to warfarin.  No significant changes in symptoms.  Note no chest pain or dyspnea.  Occasional palpitations but not significant.  No lightheadedness dizziness or syncope.  On HD Monday Wednesday Friday.  No otherwise PND, orthopnea or edema.  He went to the [facility] ER at [facility] on August 13 with chest pain over the past several weeks.-Mostly at night when he lies down keeping him sleeping.  Also affects his breathing.  Symptoms also worse with bending over. => BNP was roughly 1900 and troponins were elevated but flat at 78 and 79.  EKG showed sinus rhythm with PACs.  Not felt to have signs of pericarditis.  On warfarin for A-fib borderline therapeutic therefore less likely have PE.  Not hypoxic or tachycardic.  Troponins are chronically elevated.  Not likely ACS.  Chest pain felt to be  potentially related to chest wall pain. = Recommended cardiology follow-up.>   Subjective  Discussed the use of AI scribe software for clinical note transcription with the patient, who gave verbal consent to proceed.  History of Present Illness  Arend Bahl Totman [place] is a 58 year old male with atrial fibrillation and coronary artery disease who presents with shortness of breath and orthopnea.  He experiences significant shortness of breath, particularly  when lying down, which feels like suffocation. He has to sit up to sleep and experiences breathlessness when bending down or raising his arms above his head. These symptoms have worsened over the past few weeks, impacting his ability to perform daily activities such as walking quickly. No swelling is noted.  He has a history of atrial fibrillation and underwent mitral valve repair in 2023. He was previously on amiodarone  but is now taking metoprolol  25 mg twice daily. He also takes amlodipine  2.5 mg daily and Crestor  10 mg daily for cardiac health. He is on warfarin for anticoagulation, having switched from Eliquis  due to potential interactions with future transplant procedures. His INR was recently 3.3, and he is currently taking 10 mg of warfarin daily.  He has been on dialysis for seven years and reports issues with fluid removal during sessions, leading to drops in blood pressure. His dry weight has increased slightly, and he is unsure about his current weight due to fluctuations. He has a history of high blood pressure prior to dialysis.  He uses an Inspire device for sleep apnea, which is still being adjusted to optimal settings. He experienced a cardiac arrest in 2023, resulting in broken ribs and sternum, causing ongoing chest pain. He recently discovered a pulmonary nodule and has a CT scan scheduled.  His echocardiogram in May showed an ejection fraction of 50-55%, with mild mitral valve regurgitation and a severely dilated left atrium. He has slightly elevated pulmonary pressures.     Objective   Current Meds  Medication Sig   allopurinol  (ZYLOPRIM ) 100 MG tablet TAKE ONE TABLET BY MOUTH EVERY DAY   amLODipine  (NORVASC ) 2.5 MG tablet Take 1 tablet (2.5 mg total) by mouth daily at 8 pm.   azelastine  (ASTELIN ) 0.1 % nasal spray place TWO SPRAY IN EACH NOSTRIL TWICE DAILY   B Complex-C-Zn-Folic Acid  (DIALYVITE  800/ZINC  PO) Take 1 tablet by mouth daily.    bictegravir-emtricitabine -tenofovir  AF (BIKTARVY ) 50-200-25 MG TABS tablet Take 1 tablet by mouth daily.   cetirizine  (ZYRTEC ) 10 MG tablet TAKE ONE TABLET BY MOUTH EVERY DAY AT 9.00 IN THE MORNING   cinacalcet  (SENSIPAR ) 90 MG tablet Take 90 mg by mouth daily.   diclofenac  Sodium (VOLTAREN ) 1 % GEL Apply 2 g topically 4 (four) times daily. Apply to chest pain when lidocaine  patch not in place   Doxercalciferol  (HECTOROL  IV) 8 mcg.   ezetimibe  (ZETIA ) 10 MG tablet Take 10 mg by mouth daily.   famotidine  (PEPCID ) 20 MG tablet Take 1 tablet (20 mg total) by mouth 2 (two) times daily.   ferric citrate  (AURYXIA ) 1 GM 210 MG(Fe) tablet Take 420 mg by mouth See admin instructions. Take 420 mg by mouth three times a day with meals and 210 mg with each snack   fluticasone  (FLOVENT  HFA) 110 MCG/ACT inhaler Inhale 2 puffs into the lungs 2 (two) times daily as needed (for flares).   gabapentin  (NEURONTIN ) 300 MG capsule Take 1 capsule (300 mg total) by mouth daily as needed (pain/neuropathy).   hydrocortisone  (ANUSOL -HC) 25 MG suppository Place 1  suppository (25 mg total) rectally at bedtime.   ipratropium (ATROVENT ) 0.06 % nasal spray instill TWO SPRAYS IN EACH NOSTRIL 2-3 times DAILY AS NEEDED   iron  sucrose (VENOFER ) 20 MG/ML injection Inject into the vein 3 (three) times a week.   iron  sucrose in sodium chloride  0.9 % 100 mL 50 mg.   lactulose  (CHRONULAC ) 10 GM/15ML solution Take 45 mLs (30 g total) by mouth daily as needed. Take 15-30 ml up to twice daily for constipation   levalbuterol  (XOPENEX  HFA) 45 MCG/ACT inhaler INHALE TWO puffs into THE lungs EVERY FOUR HOURS AS NEEDED wheezing   levothyroxine  (SYNTHROID ) 25 MCG tablet Take 1 tablet (25 mcg total) by mouth daily before breakfast.   lubiprostone  (AMITIZA ) 24 MCG capsule Take 1 capsule (24 mcg total) by mouth 2 (two) times daily with a meal. NEEDS APPT FOR FURTHER REFILLS   Methoxy PEG-Epoetin  Beta (MIRCERA IJ) every 30 (thirty) days.    metoprolol  tartrate (LOPRESSOR ) 25 MG tablet Take 1 tablet (25 mg total) by mouth 2 (two) times daily.   Olopatadine  HCl (PATADAY ) 0.7 % SOLN Place 1 drop into both eyes 2 (two) times daily as needed (allergies).   pantoprazole  (PROTONIX ) 40 MG tablet Take 1 tablet (40 mg total) by mouth daily.   rOPINIRole  (REQUIP ) 1 MG tablet Take 1 tablet (1 mg total) by mouth at bedtime.   rosuvastatin  (CRESTOR ) 10 MG tablet Take 1 tablet (10 mg total) by mouth daily.   sevelamer  carbonate (RENVELA ) 800 MG tablet Take 2,400 mg by mouth daily.   Spacer/Aero-Holding Chambers DEVI 1 Device by Does not apply route 2 (two) times a day.   SYMBICORT  160-4.5 MCG/ACT inhaler Inhale 2 puffs into the lungs in the morning and at bedtime.   [DISCONTINUED] apixaban  (ELIQUIS ) 5 MG TABS tablet TAKE ONE TABLET BY MOUTH TWICE DAILY - Now on Warfarin   [DISCONTINUED] HYDROcodone -acetaminophen  (NORCO/VICODIN) 5-325 MG tablet Take 1 tablet by mouth every 4 (four) hours as needed for moderate pain (pain score 4-6).  Warfarin 5 mg tab 2 tab daily - as directed    Studies Reviewed: SABRA   EKG Interpretation Date/Time:  Wednesday June 15 2024 15:24:32 EDT Ventricular Rate:  93 PR Interval:  178 QRS Duration:  88 QT Interval:  412 QTC Calculation: 512 R Axis:   74  Text Interpretation: Sinus rhythm with Premature atrial complexes Prolonged QT When compared with ECG of 08-Jun-2024 00:11, No significant change was found Confirmed by Anner Lenis ([id number]) on 06/15/2024 4:18:21 PM    INR: 1.9 (05/17/2024) INR: 3.3 (06/07/2024)  Lab Results  Component Value Date   CHOL 188 08/07/2023   HDL 36 (L) 08/07/2023   LDLCALC 122 (H) 08/07/2023   LDLDIRECT 23 08/22/2022   TRIG 169 (H) 08/07/2023   CHOLHDL 5.2 (H) 08/07/2023     Chemistry      Component Value Date/Time   NA 137 06/08/2024 0020   NA 141 07/25/2022 1244   K 3.5 06/08/2024 0020   CL 96 (L) 06/08/2024 0020   CO2 23 06/08/2024 0020   BUN 58 (H) 06/08/2024 0020    BUN 18 07/25/2022 1244   CREATININE 11.90 (H) 06/08/2024 0020   CREATININE 9.27 (H) 07/09/2023 0900      Component Value Date/Time   CALCIUM  9.7 06/08/2024 0020   ALKPHOS 86 06/01/2024 1250   AST 10 06/01/2024 1250   ALT 12 06/01/2024 1250   BILITOT 0.6 06/01/2024 1250   BILITOT <0.2 05/05/2023 1012     Lab  Results  Component Value Date   WBC 5.9 06/08/2024   HGB 10.5 (L) 06/08/2024   HCT 30.8 (L) 06/08/2024   MCV 99.7 06/08/2024   PLT 264 06/08/2024    ECHO: EF 50 to 55%.  Low normal function.  No RWMA.  Mildly dilated LV with mild concentric LVH.  Unable to assess diastolic parameters.  Mildly elevated PAP.  Severe LA dilation.  Mild MR with prosthetic angioplasty ring in place.  No MS.  Mild to moderate TR.  Mild AI.  Normal RAP.  (03/03/2024) Echo: EF 45 to 50%.  Mild decreased function global HK.  Moderate RV dilation.  30 mm Medtronic prosthetic valve device to ring in place the mitral valve with no MS and trivial MR.  No change from prior study (09/09/2022)--initial echo post MVR. CATH: Prox LAD 50%, mid LAD 50% (.  OM2 30%, proximal RCA 40%.  Mildly elevated LVEDP.  No change since October 2023.  09/11/2022 Zio patch: Intermittent 2% Atrial Flutter (Rate Range 96 to 160 Bpm.  Longest Episode Was 34 Minutes with an Average Rate 137 Bpm.  3.5% PACs.  2.4% PVCs.SABRA  1 4 beat run of VT along with ventricular couplets, triplets/bigeminy or trigeminy..  (August 2024) Chest CT : Stable 5 mm noncalcified pulmonary nodule in the RML.  Pathologic fracture of mid sternum (from CPR) extensive multivessel coronary artery calcification.  Enlarged central pulmonary arteries suggesting PHTN.  (03/03/2024)  (From [facility]) Myoview : Normal EF greater than 60%.  No ischemia or infarction.  (09/2023)  Risk Assessment/Calculations:    CHA2DS2-VASc Score = 5   This indicates a 7.2% annual risk of stroke. The patient's score is based upon: CHF History: 1 HTN History: 1 Diabetes History: 0 Stroke  History: 2 Vascular Disease History: 1 Age Score: 0 Gender Score: 0             Physical Exam:   VS:  BP 138/88   Pulse 93   Ht 5' 10 (1.778 m)   Wt 203 lb 9.6 oz (92.4 kg)   SpO2 98%   BMI 29.21 kg/m    Wt Readings from Last 3 Encounters:  06/15/24 203 lb 9.6 oz (92.4 kg)  06/08/24 202 lb (91.6 kg)  06/01/24 202 lb (91.6 kg)    GEN: Well nourished, well groomed in no acute distress; overweight/portal Line obese.  Healthy appearing  NECK: No JVD; No carotid bruits CARDIAC:  RRR with frequent ectopy;Normal S1 & S2; no murmurs, rubs, gallops RESPIRATORY:  Clear to auscultation without rales, wheezing or rhonchi ; nonlabored, good air movement. ABDOMEN: Soft, non-tender, non-distended EXTREMITIES:  No edema; No deformity : L UE fistula     ASSESSMENT AND PLAN: .    Problem List Items Addressed This Visit       Cardiology Problems   Atrial flutter, unspecified type (HCC) (Chronic)   Atrial flutter not present on EKG.  No longer on amiodarone .  He is on metoprolol  try to milligram TID with adequate heart rate control -> not likely to be chronotropic incompetence. On chronic anticoagulation with warfarin due to renal failure and potential transplant need. INR adjusted to 3.3, well-managed on 10 mg warfarin daily. - Continue metoprolol  tartrate 25 mg twice daily for rate control. - Continue warfarin 10 mg daily. - Monitor INR regularly to maintain therapeutic range.      Relevant Medications   isosorbide  mononitrate (IMDUR ) 30 MG 24 hr tablet   Atypical angina (HCC)   Unusual chest discomfort as well  as significant exertional dyspnea with positional dyspnea as well. Based on the progressive nature of the symptoms of dyspnea along with chest discomfort, I am concerned that there is potential for progression of his CAD.  Plan: Cardiac stress PET      Relevant Medications   isosorbide  mononitrate (IMDUR ) 30 MG 24 hr tablet   Other Relevant Orders   NM PET CT CARDIAC  PERFUSION MULTI W/ABSOLUTE BLOODFLOW   Cardiac Stress Test: Informed Consent Details: Physician/Practitioner Attestation; Transcribe to consent form and obtain patient signature   CAD (coronary artery disease) (Chronic)   Moderate CAD with 10 to 50% lesions in the LAD.  Now having symptoms of significant exertional dyspnea with some chest tightness. Need to exclude ischemic CAD - Order stress PET to evaluate for coronary artery disease. - Prescribe Imdur  30 mg for potential angina. - Consider further intervention if symptoms worsen or chest heaviness develops.      Relevant Medications   isosorbide  mononitrate (IMDUR ) 30 MG 24 hr tablet   Chronic diastolic CHF (congestive heart failure) (HCC) (Chronic)   Most recent echo showed improvement in EF now up to 50 to 55%.  In the low normal range. Unfortunate, diastolic parameters not able to be adequately tested but likely diastolic dysfunction noted. He denies any edema but does have some orthopnea, PND and exertional dyspnea.  Volume status control by dialysis although if they are not able to take full dialysis alone, his weights have been stable.  (Weight 206 pounds when he saw Dr. Wonda back in January) May need longer courses of dialysis to be able to pull off target weight. BP borderline elevated,-he says been having low blood pressures related to dialysis.  He is only on amlodipine  2.5 mg daily and metoprolol  tartrate 25 mg twice daily      Relevant Medications   isosorbide  mononitrate (IMDUR ) 30 MG 24 hr tablet   Essential hypertension (Chronic)   Unfortunately, his blood pressure is borderline high today, but he is hypotensive during dialysis making it very difficult to treat.  On current dose of 2.5 mg amlodipine  and 25 mg twice daily of metoprolol , his blood pressure is relatively controlled.      Relevant Medications   isosorbide  mononitrate (IMDUR ) 30 MG 24 hr tablet   Hypotension (Chronic)   Issues with low blood pressure in  the past, notably blood blood pressures in dialysis making it difficult to complete dialysis.  Therefore we do not have acceptable reduction.      Relevant Medications   isosorbide  mononitrate (IMDUR ) 30 MG 24 hr tablet   PAH (pulmonary artery hypertension) (HCC) (Chronic)   Previous numbers indicated possible pulm hypertension although recent echo showing mildly elevated PAP.  This could be contributing to some dyspnea. If initial ischemic evaluation is not significant, would potentially consider further evaluation based on PFT results.  May consider right heart cath.      Relevant Medications   isosorbide  mononitrate (IMDUR ) 30 MG 24 hr tablet     Other   Chest pain (Chronic)   Chronic musculoskeletal pain from CPR which may be due to the tightness he feels in his chest but is having hard time breathing, but cannot exclude angina based on presence of CAD.  -Will check cardiac stress PET as there is high enough likelihood with existing CAD but there is significant disease and would like to exclude balanced ischemia, as well as potentially predict reduced coronary flow reserve.      ESRD (end stage renal  disease) on dialysis (HCC) (Chronic)   Now fluid volume is heart dialysis dependent.  Unfortunately low blood pressures limit full volume removal with dialysis.  May need more frequent or longer duration of dialysis runs to maintain stable weights.      Obstructive sleep apnea (Chronic)   Recommend continued CPAP      S/P MVR (mitral valve repair)   Stable on recent echo      Shortness of breath - Primary (Chronic)   Dyspnea with exertion orthopnea and exertional symptoms Worsening dyspnea with orthopnea and exertional symptoms. Differential includes heart failure and coronary artery disease progression. Echocardiogram shows low normal ejection fraction and severely dilated left atrium. EKG shows premature beats, no AFib. Stress PET preferred for coronary artery disease  evaluation.  Will evaluate for ischemic etiology with cardiac stress PET      Relevant Orders   EKG 12-Lead (Completed)   NM PET CT CARDIAC PERFUSION MULTI W/ABSOLUTE BLOODFLOW   Cardiac Stress Test: Informed Consent Details: Physician/Practitioner Attestation; Transcribe to consent form and obtain patient signature        Informed Consent   Shared Decision Making/Informed Consent{ The risks [chest pain, shortness of breath, cardiac arrhythmias, dizziness, blood pressure fluctuations, myocardial infarction, stroke/transient ischemic attack, nausea, vomiting, allergic reaction, radiation exposure, metallic taste sensation and life-threatening complications (estimated to be 1 in 10,000)], benefits (risk stratification, diagnosing coronary artery disease, treatment guidance) and alternatives of a cardiac PET stress test were discussed in detail with Mr. Camire and he agrees to proceed.     . Informed Consent   Shared Decision Making/Informed Consent The risks [chest pain, shortness of breath, cardiac arrhythmias, dizziness, blood pressure fluctuations, myocardial infarction, stroke/transient ischemic attack, nausea, vomiting, allergic reaction, radiation exposure, metallic taste sensation and life-threatening complications (estimated to be 1 in 10,000)], benefits (risk stratification, diagnosing coronary artery disease, treatment guidance) and alternatives of a nuclear stress test were discussed in detail with Mr. Schaad and he agrees to proceed.      Follow-Up: Return in about 2 months (around 08/15/2024) for Follow-up with APP, or Dr. Wonda, To discuss test results.  Total time spent: 23 min spent with patient + 27 min spent charting = 50 min I spent 50 minutes in the care of Takumi Din Dietrick today including reviewing labs (1 min), reviewing studies (previous cardiac catheterization report as well as several echocardiogram reports reviewed.  CT scan results reviewed -8 minutes), reviewing  outside studies (2 minutes), face to face time discussing treatment options (23 minutes), reviewing records from previous clinic notes from Dr. Wonda, Dr. Cindie as well as Dr. Milagros and [facility] (8 minutes), 8 minutes dictating, and documenting in the encounter.     Signed, Alm MICAEL Clay, MD, MS Alm Clay, M.D., M.S. Interventional Chartered certified accountant  Pager # [phone]

## 2024-06-15 NOTE — Assessment & Plan Note (Signed)
 Moderate CAD with 10 to 50% lesions in the LAD.  Now having symptoms of significant exertional dyspnea with some chest tightness. Need to exclude ischemic CAD - Order stress PET to evaluate for coronary artery disease. - Prescribe Imdur  30 mg for potential angina. - Consider further intervention if symptoms worsen or chest heaviness develops.

## 2024-06-15 NOTE — Patient Instructions (Addendum)
 Medication Instructions:    Start taking Isosorbide  Mono ( Imdur ) 30 mg at bedtime  *If you need a refill on your cardiac medications before your next appointment, please call your pharmacy*   Lab Work: Not needed    Testing/Procedures: Schedule at [facility] A cardiac PET stress test, also known as a myocardial perfusion scan, is a noninvasive procedure that measures blood flow to the heart muscle:  How it works A radioactive tracer is injected into a vein, and a PET scanner creates images of the heart. A medication is used to increase blood flow to the heart during the test.  What it can detect A cardiac PET stress test can help determine if the heart muscle is healthy, if there is damage or scar tissue, or if there is a buildup of abnormal substances. It can also detect disease in small blood vessels that are not visible on an angiogra   Follow-Up: At [facility], you and your health needs are our priority.  As part of our continuing mission to provide you with exceptional heart care, we have created designated Provider Care Teams.  These Care Teams include your primary Cardiologist (physician) and Advanced Practice Providers (APPs -  Physician Assistants and Nurse Practitioners) who all work together to provide you with the care you need, when you need it.     Your next appointment:   2 month(s)  The format for your next appointment:   In Person  Provider:   [name], MD or One of our Advanced Practice Providers (APPs): Morse Clause, PA-C  Lamarr Satterfield, NP Miriam Shams, NP  Olivia Pavy, PA-C Josefa Beauvais, NP  Leontine Salen, PA-C Orren Fabry, PA-C  [place], PA-C Ernest Dick, NP  Damien Braver, NP Jon Hails, PA-C  Waddell Donath, PA-C    Dayna Dunn, PA-C  Glendia Ferrier, PA-C Lum Louis, NP Katlyn West, NP Callie Goodrich, PA-C  Evan Williams, PA-C Sheng Haley, PA-C  Xika Zhao, NP Kathleen Johnson, PA-C      Other Instructions  .    Please report to Radiology at the [facility] Main Entrance 30 minutes early for your test.  [address] [place], [place] [address]                         OR   Please report to Radiology at [facility] Main Entrance, medical mall, 30 mins prior to your test.  [address]  [place], [place]  How to Prepare for Your Cardiac PET/CT Stress Test:  Nothing to eat or drink, except water , 3 hours prior to arrival time.  NO caffeine/decaffeinated products, or chocolate 12 hours prior to arrival. (Please note decaffeinated beverages (teas/coffees) still contain caffeine).  If you have caffeine within 12 hours prior, the test will need to be rescheduled.  Medication instructions: Do not take erectile dysfunction medications for 72 hours prior to test (sildenafil, tadalafil) Do not take nitrates (isosorbide  mononitrate, Ranexa) the day before or day of test Do not take tamsulosin the day before or morning of test Hold theophylline containing medications for 12 hours. Hold Dipyridamole 48 hours prior to the test.  Diabetic Preparation: If able to eat breakfast prior to 3 hour fasting, you may take all medications, including your insulin . Do not worry if you miss your breakfast dose of insulin  - start at your next meal. If you do not eat prior to 3 hour fast-Hold all diabetes (  oral and insulin ) medications. Patients who wear a continuous glucose monitor MUST remove the device prior to scanning.  You may take your remaining medications with water .  NO perfume, cologne or lotion on chest or abdomen area. FEMALES - Please avoid wearing dresses to this appointment.  Total time is 1 to 2 hours; you may want to bring reading material for the waiting time.  IF YOU THINK YOU MAY BE PREGNANT, OR ARE NURSING PLEASE INFORM THE TECHNOLOGIST.  In preparation for your appointment, medication and supplies will be purchased.  Appointment availability is  limited, so if you need to cancel or reschedule, please call the Radiology Department Scheduler at [phone] 24 hours in advance to avoid a cancellation fee of $100.00  What to Expect When you Arrive:  Once you arrive and check in for your appointment, you will be taken to a preparation room within the Radiology Department.  A technologist or Nurse will obtain your medical history, verify that you are correctly prepped for the exam, and explain the procedure.  Afterwards, an IV will be started in your arm and electrodes will be placed on your skin for EKG monitoring during the stress portion of the exam. Then you will be escorted to the PET/CT scanner.  There, staff will get you positioned on the scanner and obtain a blood pressure and EKG.  During the exam, you will continue to be connected to the EKG and blood pressure machines.  A small, safe amount of a radioactive tracer will be injected in your IV to obtain a series of pictures of your heart along with an injection of a stress agent.    After your Exam:  It is recommended that you eat a meal and drink a caffeinated beverage to counter act any effects of the stress agent.  Drink plenty of fluids for the remainder of the day and urinate frequently for the first couple of hours after the exam.  Your doctor will inform you of your test results within 7-10 business days.  For more information and frequently asked questions, please visit our website: [email or web address]  For questions about your test or how to prepare for your test, please call: Cardiac Imaging Nurse Navigators Office: [phone]

## 2024-06-15 NOTE — Assessment & Plan Note (Signed)
 Atrial flutter not present on EKG.  No longer on amiodarone .  He is on metoprolol  try to milligram TID with adequate heart rate control -> not likely to be chronotropic incompetence. On chronic anticoagulation with warfarin due to renal failure and potential transplant need. INR adjusted to 3.3, well-managed on 10 mg warfarin daily. - Continue metoprolol  tartrate 25 mg twice daily for rate control. - Continue warfarin 10 mg daily. - Monitor INR regularly to maintain therapeutic range.

## 2024-06-16 DIAGNOSIS — I2089 Other forms of angina pectoris: Secondary | ICD-10-CM | POA: Insufficient documentation

## 2024-06-16 NOTE — Assessment & Plan Note (Signed)
 Unfortunately, his blood pressure is borderline high today, but he is hypotensive during dialysis making it very difficult to treat.  On current dose of 2.5 mg amlodipine  and 25 mg twice daily of metoprolol , his blood pressure is relatively controlled.

## 2024-06-16 NOTE — Assessment & Plan Note (Signed)
 Issues with low blood pressure in the past, notably blood blood pressures in dialysis making it difficult to complete dialysis.  Therefore we do not have acceptable reduction.

## 2024-06-16 NOTE — Assessment & Plan Note (Signed)
 Dyspnea with exertion orthopnea and exertional symptoms Worsening dyspnea with orthopnea and exertional symptoms. Differential includes heart failure and coronary artery disease progression. Echocardiogram shows low normal ejection fraction and severely dilated left atrium. EKG shows premature beats, no AFib. Stress PET preferred for coronary artery disease evaluation.  Will evaluate for ischemic etiology with cardiac stress PET

## 2024-06-16 NOTE — Assessment & Plan Note (Signed)
 Now fluid volume is heart dialysis dependent.  Unfortunately low blood pressures limit full volume removal with dialysis.  May need more frequent or longer duration of dialysis runs to maintain stable weights.

## 2024-06-16 NOTE — Assessment & Plan Note (Addendum)
 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  -Will check cardiac stress PET as there is high enough likelihood with existing CAD but there is significant disease and would like to exclude balanced ischemia, as well as potentially predict reduced coronary flow reserve.

## 2024-06-16 NOTE — Assessment & Plan Note (Signed)
 Recommend continued CPAP

## 2024-06-16 NOTE — Assessment & Plan Note (Signed)
 Unusual chest discomfort as well as significant exertional dyspnea with positional dyspnea as well. Based on the progressive nature of the symptoms of dyspnea along with chest discomfort, I am concerned that there is potential for progression of his CAD.  Plan: Cardiac stress PET

## 2024-06-16 NOTE — Assessment & Plan Note (Signed)
 Previous numbers indicated possible pulm hypertension although recent echo showing mildly elevated PAP.  This could be contributing to some dyspnea. If initial ischemic evaluation is not significant, would potentially consider further evaluation based on PFT results.  May consider right heart cath.

## 2024-06-16 NOTE — Assessment & Plan Note (Signed)
Stable on recent echo.

## 2024-06-16 NOTE — Assessment & Plan Note (Signed)
 Most recent echo showed improvement in EF now up to 50 to 55%.  In the low normal range. Unfortunate, diastolic parameters not able to be adequately tested but likely diastolic dysfunction noted. He denies any edema but does have some orthopnea, PND and exertional dyspnea.  Volume status control by dialysis although if they are not able to take full dialysis alone, his weights have been stable.  (Weight 206 pounds when he saw Dr. Wonda back in January) May need longer courses of dialysis to be able to pull off target weight. BP borderline elevated,-he says been having low blood pressures related to dialysis.  He is only on amlodipine  2.5 mg daily and metoprolol  tartrate 25 mg twice daily

## 2024-06-17 ENCOUNTER — Other Ambulatory Visit: Payer: Self-pay | Admitting: Cardiovascular Disease

## 2024-06-19 ENCOUNTER — Other Ambulatory Visit: Payer: Self-pay

## 2024-06-19 ENCOUNTER — Encounter (HOSPITAL_COMMUNITY): Payer: Self-pay

## 2024-06-19 ENCOUNTER — Emergency Department (HOSPITAL_COMMUNITY)
Admission: EM | Admit: 2024-06-19 | Discharge: 2024-06-20 | Disposition: A | Discharge status: Home / Self Care | Attending: Emergency Medicine | Admitting: Emergency Medicine

## 2024-06-19 DIAGNOSIS — N186 End stage renal disease: Secondary | ICD-10-CM | POA: Diagnosis not present

## 2024-06-19 DIAGNOSIS — Z79899 Other long term (current) drug therapy: Secondary | ICD-10-CM | POA: Insufficient documentation

## 2024-06-19 DIAGNOSIS — R1012 Left upper quadrant pain: Secondary | ICD-10-CM | POA: Insufficient documentation

## 2024-06-19 DIAGNOSIS — Z85528 Personal history of other malignant neoplasm of kidney: Secondary | ICD-10-CM | POA: Insufficient documentation

## 2024-06-19 DIAGNOSIS — Z7951 Long term (current) use of inhaled steroids: Secondary | ICD-10-CM | POA: Diagnosis not present

## 2024-06-19 DIAGNOSIS — I5032 Chronic diastolic (congestive) heart failure: Secondary | ICD-10-CM | POA: Insufficient documentation

## 2024-06-19 DIAGNOSIS — I132 Hypertensive heart and chronic kidney disease with heart failure and with stage 5 chronic kidney disease, or end stage renal disease: Secondary | ICD-10-CM | POA: Diagnosis not present

## 2024-06-19 DIAGNOSIS — R0601 Orthopnea: Secondary | ICD-10-CM | POA: Insufficient documentation

## 2024-06-19 DIAGNOSIS — Z8673 Personal history of transient ischemic attack (TIA), and cerebral infarction without residual deficits: Secondary | ICD-10-CM | POA: Diagnosis not present

## 2024-06-19 DIAGNOSIS — R140 Abdominal distension (gaseous): Secondary | ICD-10-CM

## 2024-06-19 DIAGNOSIS — J45909 Unspecified asthma, uncomplicated: Secondary | ICD-10-CM | POA: Insufficient documentation

## 2024-06-19 DIAGNOSIS — Z21 Asymptomatic human immunodeficiency virus [HIV] infection status: Secondary | ICD-10-CM | POA: Diagnosis not present

## 2024-06-19 DIAGNOSIS — Z992 Dependence on renal dialysis: Secondary | ICD-10-CM | POA: Insufficient documentation

## 2024-06-19 DIAGNOSIS — E039 Hypothyroidism, unspecified: Secondary | ICD-10-CM | POA: Insufficient documentation

## 2024-06-19 LAB — CBC
HCT: 31.6 % — ABNORMAL LOW (ref 39.0–52.0)
Hemoglobin: 10.6 g/dL — ABNORMAL LOW (ref 13.0–17.0)
MCH: 33.2 pg (ref 26.0–34.0)
MCHC: 33.5 g/dL (ref 30.0–36.0)
MCV: 99.1 fL (ref 80.0–100.0)
Platelets: 226 K/uL (ref 150–400)
RBC: 3.19 MIL/uL — ABNORMAL LOW (ref 4.22–5.81)
RDW: 14.2 % (ref 11.5–15.5)
WBC: 6.7 K/uL (ref 4.0–10.5)
nRBC: 0 % (ref 0.0–0.2)

## 2024-06-19 LAB — COMPREHENSIVE METABOLIC PANEL WITH GFR
ALT: 13 U/L (ref 0–44)
AST: 13 U/L — ABNORMAL LOW (ref 15–41)
Albumin: 3.7 g/dL (ref 3.5–5.0)
Alkaline Phosphatase: 83 U/L (ref 38–126)
Anion gap: 20 — ABNORMAL HIGH (ref 5–15)
BUN: 67 mg/dL — ABNORMAL HIGH (ref 6–20)
CO2: 18 mmol/L — ABNORMAL LOW (ref 22–32)
Calcium: 9.8 mg/dL (ref 8.9–10.3)
Chloride: 96 mmol/L — ABNORMAL LOW (ref 98–111)
Creatinine, Ser: 12.91 mg/dL — ABNORMAL HIGH (ref 0.61–1.24)
GFR, Estimated: 4 mL/min — ABNORMAL LOW (ref >60)
Glucose, Bld: 103 mg/dL — ABNORMAL HIGH (ref 70–99)
Potassium: 4 mmol/L (ref 3.5–5.1)
Sodium: 134 mmol/L — ABNORMAL LOW (ref 135–145)
Total Bilirubin: 0.6 mg/dL (ref 0.0–1.2)
Total Protein: 7.5 g/dL (ref 6.5–8.1)

## 2024-06-19 LAB — LIPASE, BLOOD: Lipase: 69 U/L — ABNORMAL HIGH (ref 11–51)

## 2024-06-19 NOTE — ED Triage Notes (Signed)
 Pt reports last dialysis. Pt reports it was his normal volume.

## 2024-06-19 NOTE — ED Triage Notes (Signed)
 Pt reports feeling like he is having gas and bloating he reports he can't get rid of it. Pt is reporting shortness of breath. Pt reports having small bowel movements. pt denies vomiting but he reports that he has had episodes of nausea. Pt reports abdominal pain.

## 2024-06-20 ENCOUNTER — Emergency Department (HOSPITAL_COMMUNITY)

## 2024-06-20 DIAGNOSIS — R1012 Left upper quadrant pain: Secondary | ICD-10-CM | POA: Diagnosis not present

## 2024-06-20 LAB — PROTIME-INR
INR: 1.3 — ABNORMAL HIGH (ref 0.8–1.2)
Prothrombin Time: 16.5 s — ABNORMAL HIGH (ref 11.4–15.2)

## 2024-06-20 MED ORDER — IOHEXOL 350 MG/ML SOLN
75.0000 mL | Freq: Once | INTRAVENOUS | Status: AC | PRN
  Administered 2024-06-20: 75 mL via INTRAVENOUS

## 2024-06-20 NOTE — ED Notes (Signed)
 Patient returned from CT

## 2024-06-20 NOTE — ED Notes (Signed)
 Patient transported to X-ray

## 2024-06-20 NOTE — ED Provider Notes (Signed)
 [place] EMERGENCY DEPARTMENT AT [facility] Provider Note  CSN: [id number] Arrival date & time: 06/19/24 2047  Chief Complaint(s) Shortness of Breath and Abdominal Pain  HPI Albert Eaton is a 58 y.o. male with ESRD on HD MWF here for several days of orthopnea and abdominal bloating.  Patient believes he is constipated and has been taking laxatives with minimal bowel movements.  He is endorsing left upper quadrant/left sided abdominal pain.  States that lying down causes him to feel short of breath.  He denies any fevers or chills.  No chest pain.  Reports nausea without emesis.  HPI  Past Medical History Past Medical History:  Diagnosis Date   Anemia    Aortic atherosclerosis (HCC)    Arthritis    Ascending aorta dilation (HCC) 02/17/2023   Limited TTE 02/17/23: no effusion, s/p MV repair w trivial residual MR, asc aorta 39 mm, mod TR, severe LAE   Asthma    Cancer (HCC)    renal cyst   Chronic diastolic CHF (congestive heart failure) (HCC) 01/06/2017   Echo 7/16 [facility] in [place], [place]) Mild AI, mild LAE, mild concentric LVH, EF 55, normal wall motion, mild to moderate MR, mild PI, RVSP 55 // Echo 10/09/16 ([facility]):  Moderate LVH, grade 2 diastolic dysfunction, mild MR, moderate LAE    Dysrhythmia    paroxysmal afib/flutter   Esophagitis    ESRD (end stage renal disease) on dialysis [facility])    Dialysis Mon Wed Fri   GERD (gastroesophageal reflux disease)    Gout    no current problems   Heart murmur    had it since he was a baby- pt had echo 10/01/23   Hepatitis    Hep B   HIV (human immunodeficiency virus infection) (HCC)    HLD (hyperlipidemia)    Hypertension    Hypothyroidism    Mitral regurgitation    Myocardial infarction (HCC)    Neuropathy    Sleep apnea    does not use c-pap   Stroke [facility])    Wears glasses    Wears partial dentures    Patient Active Problem List   Diagnosis Date Noted   Atypical angina (HCC) 06/16/2024   Lung nodule  04/25/2024   Acute hypoxic respiratory failure (HCC) 03/03/2024   Acute hypoxemic respiratory failure (HCC) 03/02/2024   History of pneumonia 12/31/2023   Tachycardia 12/31/2023   Long term (current) use of anticoagulants 11/05/2023   ESRD (end stage renal disease) (HCC) 10/29/2023   Atrial flutter, unspecified type (HCC) 10/29/2023   Precordial pain 02/17/2023   Heart failure with mildly reduced ejection fraction (HFmrEF) (HCC) 02/17/2023   Hypotension 02/17/2023   Ascending aorta dilation (HCC) 02/17/2023   CAD (coronary artery disease) 02/16/2023   Chronic viral hepatitis B without delta-agent (HCC) 01/06/2023   Cancer screening 01/06/2023   Vitamin D  deficiency 01/01/2023   Acute cough 11/25/2022   Influenza A 11/25/2022   Other headache syndrome 11/25/2022   Multiple fractures of ribs, bilateral, initial encounter for closed fracture 09/16/2022   Cardiac arrest (HCC) 09/08/2022   Acute cerebrovascular insufficiency 08/28/2022   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 08/28/2022   Cerebrovascular accident (CVA) due to embolism of left cerebellar artery (HCC)    S/P MVR (mitral valve repair) 08/21/2022   Chronic pain of right ankle 06/26/2022   Autonomic neuropathy in diseases classified elsewhere 04/21/2022   PAF (paroxysmal atrial fibrillation) (HCC) 12/02/2021   Laryngopharyngeal reflux (LPR) 11/11/2021  Chest pain    Elevated troponin    NSTEMI (non-ST elevated myocardial infarction) (HCC) 09/03/2021   Essential hypertension 09/03/2021   Renal cell carcinoma (HCC) 06/27/2021   Generalized (acute) peritonitis (HCC) 06/24/2021   Other disorders of bilirubin metabolism 06/24/2021   Renal mass 02/18/2021   Pain, unspecified 02/08/2021   SVT (supraventricular tachycardia) (HCC) 01/18/2021   Lower abdominal pain 06/26/2020   Contact with and (suspected) exposure to tuberculosis 04/02/2020   Fluid overload, unspecified 04/02/2020   Pulsatile tinnitus of  right ear 11/21/2019   Kidney failure 11/21/2019   Acute diastolic CHF (congestive heart failure) (HCC) 11/21/2019   Fever and chills 11/07/2019   Hypercalcemia 10/17/2019   Dependence on renal dialysis (HCC) 06/06/2019   Coagulation defect, unspecified (HCC) 05/23/2019   Iron  deficiency anemia, unspecified 05/16/2019   Anemia in other chronic diseases classified elsewhere 05/13/2019   Arteriovenous fistula, acquired (HCC) 05/13/2019   Body mass index (BMI) 28.0-28.9, adult 05/13/2019   Crohn's disease of small intestine without complications (HCC) 05/13/2019   Encounter for general adult medical examination with abnormal findings 05/13/2019   Gout, unspecified 05/13/2019   Nausea 05/13/2019   Other fatigue 05/13/2019   Secondary hyperparathyroidism of renal origin (HCC) 05/13/2019   ESRD (end stage renal disease) on dialysis (HCC) 05/13/2019   Chronic diastolic (congestive) heart failure (HCC) 05/13/2019   Chronic kidney disease, unspecified 05/13/2019   Obstructive sleep apnea 05/13/2019   Discomfort of right ear 02/18/2019   Hypokalemia 06/13/2018   Abnormal CT scan, small bowel    Perennial allergic rhinitis with a predominant nonallergic component 02/09/2018   Allergic conjunctivitis 02/09/2018   Mild intermittent asthma 02/09/2018   Atherosclerosis 12/09/2017   Hypothyroidism 11/19/2017   Mixed hyperlipidemia 10/13/2017   Personal history of gout 10/13/2017   S/P cholecystectomy 10/13/2017   Congestive heart failure with LV diastolic dysfunction, NYHA class 1 (HCC) 10/13/2017   Hypothyroidism (acquired) 10/13/2017   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/31/2017   Shortness of breath 06/07/2017   Arthritis, gouty 01/27/2017   Chronic diastolic CHF (congestive heart failure) (HCC) 01/06/2017   OSA (obstructive sleep apnea) 01/06/2017   Abnormal electrocardiogram (ECG) (EKG) 10/31/2016   Pleuritic chest pain 10/09/2016   HIV disease (HCC) 10/09/2016   Hypertensive heart disease  with CHF (congestive heart failure) (HCC) 10/09/2016   Lumbar back pain 05/29/2015   NYHA class 2 heart failure with preserved ejection fraction (HCC) 05/15/2015   PAH (pulmonary artery hypertension) (HCC) 05/08/2015   Severe mitral regurgitation 05/04/2015   Prolonged Q-T interval on ECG 05/04/2015   Home Medication(s) Prior to Admission medications   Medication Sig Start Date End Date Taking? Authorizing Provider  allopurinol  (ZYLOPRIM ) 100 MG tablet TAKE ONE TABLET BY MOUTH EVERY DAY 06/10/24   [name], NP  amLODipine  (NORVASC ) 2.5 MG tablet Take 1 tablet (2.5 mg total) by mouth daily at 8 pm. 03/16/24   Wonda Sharper, MD  azelastine  (ASTELIN ) 0.1 % nasal spray place TWO SPRAY IN EACH NOSTRIL TWICE DAILY 06/10/24   Gallagher, Joel Louis, MD  B Complex-C-Zn-Folic Acid  (DIALYVITE  800/ZINC  PO) Take 1 tablet by mouth daily. 12/14/23   [provider]  bictegravir-emtricitabine -tenofovir  AF (BIKTARVY ) 50-200-25 MG TABS tablet Take 1 tablet by mouth daily. 07/28/23   Efrain Lamar ORN, MD  cetirizine  (ZYRTEC ) 10 MG tablet TAKE ONE TABLET BY MOUTH EVERY DAY AT 9.00 IN THE MORNING 06/03/24   Iva Marty Saltness, MD  cinacalcet  (SENSIPAR ) 90 MG tablet Take 90 mg by mouth daily.  10/08/23   [provider]  diclofenac  Sodium (VOLTAREN ) 1 % GEL Apply 2 g topically 4 (four) times daily. Apply to chest pain when lidocaine  patch not in place 09/14/22   Bryn Bernardino NOVAK, MD  Doxercalciferol  (HECTOROL  IV) 8 mcg. 03/18/24 03/17/25  [provider]  ezetimibe  (ZETIA ) 10 MG tablet Take 10 mg by mouth daily. 02/10/24   [provider]  famotidine  (PEPCID ) 20 MG tablet Take 1 tablet (20 mg total) by mouth 2 (two) times daily. 11/13/22   Iva Marty Saltness, MD  ferric citrate  (AURYXIA ) 1 GM 210 MG(Fe) tablet Take 420 mg by mouth See admin instructions. Take 420 mg by mouth three times a day with meals and 210 mg with each snack    [provider]  fluticasone  (FLOVENT   HFA) 110 MCG/ACT inhaler Inhale 2 puffs into the lungs 2 (two) times daily as needed (for flares).    [provider]  gabapentin  (NEURONTIN ) 300 MG capsule Take 1 capsule (300 mg total) by mouth daily as needed (pain/neuropathy). 09/14/22   Bryn Bernardino NOVAK, MD  hydrocortisone  (ANUSOL -HC) 25 MG suppository Place 1 suppository (25 mg total) rectally at bedtime. 04/07/24   May, Deanna J, NP  ipratropium (ATROVENT ) 0.06 % nasal spray instill TWO SPRAYS IN EACH NOSTRIL 2-3 times DAILY AS NEEDED 03/10/24   Iva Marty Saltness, MD  iron  sucrose (VENOFER ) 20 MG/ML injection Inject into the vein 3 (three) times a week. 11/27/23 11/25/24  [provider]  iron  sucrose in sodium chloride  0.9 % 100 mL 50 mg. 03/25/24 03/24/25  [provider]  isosorbide  mononitrate (IMDUR ) 30 MG 24 hr tablet Take 1 tablet (30 mg total) by mouth at bedtime. 06/15/24 09/13/24  Anner Alm ORN, MD  lactulose  (CHRONULAC ) 10 GM/15ML solution Take 45 mLs (30 g total) by mouth daily as needed. Take 15-30 ml up to twice daily for constipation 01/08/23   Pyrtle, Gordy HERO, MD  levalbuterol  (XOPENEX  HFA) 45 MCG/ACT inhaler INHALE TWO puffs into THE lungs EVERY FOUR HOURS AS NEEDED wheezing 06/03/24   Iva Marty Saltness, MD  levothyroxine  (SYNTHROID ) 25 MCG tablet Take 1 tablet (25 mcg total) by mouth daily before breakfast. 10/27/23   [name], NP  lubiprostone  (AMITIZA ) 24 MCG capsule Take 1 capsule (24 mcg total) by mouth 2 (two) times daily with a meal. NEEDS APPT FOR FURTHER REFILLS 10/19/23   Pyrtle, Gordy HERO, MD  Methoxy PEG-Epoetin  Beta (MIRCERA IJ) every 30 (thirty) days. 11/27/23 11/25/24  [provider]  metoprolol  tartrate (LOPRESSOR ) 25 MG tablet TAKE ONE TABLET BY MOUTH TWICE DAILY 06/17/24   Cooper, Michael, MD  Olopatadine  HCl (PATADAY ) 0.7 % SOLN Place 1 drop into both eyes 2 (two) times daily as needed (allergies). 11/13/22   Iva Marty Saltness, MD  pantoprazole  (PROTONIX ) 40 MG tablet Take  1 tablet (40 mg total) by mouth daily. 05/06/24   Iva Marty Saltness, MD  rOPINIRole  (REQUIP ) 1 MG tablet Take 1 tablet (1 mg total) by mouth at bedtime. 10/26/23   Neda Jennet LABOR, MD  rosuvastatin  (CRESTOR ) 10 MG tablet Take 1 tablet (10 mg total) by mouth daily. 10/19/23   Cooper, Michael, MD  sevelamer  carbonate (RENVELA ) 800 MG tablet Take 2,400 mg by mouth daily. 04/22/23   [provider]  Spacer/Aero-Holding Raguel DEVI 1 Device by Does not apply route 2 (two) times a day. 03/01/19   Bobbitt, Elgin Pepper, MD  SYMBICORT  160-4.5 MCG/ACT inhaler Inhale 2 puffs into the lungs in the  morning and at bedtime. 07/01/23   Iva Marty Saltness, MD                                                                                                                                    Allergies Ace inhibitors  Review of Systems Review of Systems As noted in HPI  Physical Exam Vital Signs  I have reviewed the triage vital signs BP (!) 143/91   Pulse 72   Temp 98 F (36.7 C) (Oral)   Resp (!) 24   Ht 5' 10 (1.778 m)   Wt 93 kg   SpO2 100%   BMI 29.41 kg/m   Physical Exam Vitals reviewed.  Constitutional:      General: He is not in acute distress.    Appearance: He is well-developed. He is not diaphoretic.  HENT:     Head: Normocephalic and atraumatic.     Nose: Nose normal.  Eyes:     General: No scleral icterus.       Right eye: No discharge.        Left eye: No discharge.     Conjunctiva/sclera: Conjunctivae normal.     Pupils: Pupils are equal, round, and reactive to light.  Cardiovascular:     Rate and Rhythm: Normal rate and regular rhythm.     Heart sounds: No murmur heard.    No friction rub. No gallop.     Arteriovenous access: Left arteriovenous access is present.  Pulmonary:     Effort: Pulmonary effort is normal. No respiratory distress.     Breath sounds: Normal breath sounds. No stridor. No wheezing or rales.  Abdominal:     General: There is no  distension.     Palpations: Abdomen is soft.     Tenderness: There is abdominal tenderness in the left upper quadrant.  Musculoskeletal:        General: No tenderness.     Cervical back: Normal range of motion and neck supple.  Skin:    General: Skin is warm and dry.     Findings: No erythema or rash.  Neurological:     Mental Status: He is alert and oriented to person, place, and time.     ED Results and Treatments Labs (all labs ordered are listed, but only abnormal results are displayed) Labs Reviewed  LIPASE, BLOOD - Abnormal; Notable for the following components:      Result Value   Lipase 69 (*)    All other components within normal limits  COMPREHENSIVE METABOLIC PANEL WITH GFR - Abnormal; Notable for the following components:   Sodium 134 (*)    Chloride 96 (*)    CO2 18 (*)    Glucose, Bld 103 (*)    BUN 67 (*)    Creatinine, Ser 12.91 (*)    AST 13 (*)    GFR, Estimated 4 (*)    Anion gap 20 (*)  All other components within normal limits  CBC - Abnormal; Notable for the following components:   RBC 3.19 (*)    Hemoglobin 10.6 (*)    HCT 31.6 (*)    All other components within normal limits  PROTIME-INR - Abnormal; Notable for the following components:   Prothrombin Time 16.5 (*)    INR 1.3 (*)    All other components within normal limits                                                                                                                         EKG  EKG Interpretation Date/Time:    Ventricular Rate:    PR Interval:    QRS Duration:    QT Interval:    QTC Calculation:   R Axis:      Text Interpretation:         Radiology CT ABDOMEN PELVIS W CONTRAST Result Date: 06/20/2024 CLINICAL DATA:  Abdominal pain and shortness of breath. EXAM: CT ABDOMEN AND PELVIS WITH CONTRAST TECHNIQUE: Multidetector CT imaging of the abdomen and pelvis was performed using the standard protocol following bolus administration of intravenous contrast. RADIATION  DOSE REDUCTION: This exam was performed according to the departmental dose-optimization program which includes automated exposure control, adjustment of the mA and/or kV according to patient size and/or use of iterative reconstruction technique. CONTRAST:  75mL OMNIPAQUE  IOHEXOL  350 MG/ML SOLN COMPARISON:  December 14, 2023 FINDINGS: Lower chest: Mild right middle lobe, right lower lobe and posterior left basilar scarring and/or atelectasis is seen. A stable 5 mm right middle lobe pulmonary nodule is noted. Hepatobiliary: No focal liver abnormality is seen. Status post cholecystectomy. No biliary dilatation. Pancreas: Unremarkable. No pancreatic ductal dilatation or surrounding inflammatory changes. Spleen: Subcentimeter calcified granulomas are seen within the parenchyma of an otherwise normal-appearing spleen. Adrenals/Urinary Tract: Adrenal glands are unremarkable. Left kidney is surgically absent. The right kidney is atrophic in appearance. Several small simple renal cysts are noted. No obstructing renal calculi or hydronephrosis is seen. There is stable moderate severity right-sided perinephric inflammatory fat stranding. The urinary bladder is poorly distended and subsequently limited in evaluation. Mild urinary bladder wall thickening is noted with a mild amount of surrounding inflammatory fat stranding. Stomach/Bowel: Stomach is within normal limits. Appendix appears normal. No evidence of bowel wall thickening, distention, or inflammatory changes. Vascular/Lymphatic: Aortic atherosclerosis. No enlarged abdominal or pelvic lymph nodes. Reproductive: The prostate gland is mildly enlarged. Other: Stable bilateral fat containing inguinal hernias are noted. No abdominopelvic ascites. Musculoskeletal: Diffusely sclerotic osseous structures are again seen with numerous Schmorl's nodes noted throughout the thoracolumbar spine. IMPRESSION: 1. Stable 5 mm right middle lobe pulmonary nodule. No follow-up needed if  patient is low-risk.This recommendation follows the consensus statement: Guidelines for Management of Incidental Pulmonary Nodules Detected on CT Images: From the Fleischner Society 2017; Radiology 2017; 284:228-243. 2. Evidence of prior cholecystectomy and left nephrectomy. 3. Atrophic right kidney with several small simple renal cysts. No follow-up imaging is  recommended. This recommendation follows ACR consensus guidelines: Management of the Incidental Renal Mass on CT: A White Paper of the ACR Incidental Findings Committee. J Am Coll Radiol [phone]. 4. Mild urinary bladder wall thickening with a mild amount of surrounding inflammatory fat stranding. Correlation with urinalysis is recommended to exclude cystitis. 5. Stable bilateral fat-containing inguinal hernias. 6. Aortic atherosclerosis. Electronically Signed   By: Suzen Dials M.D.   On: 06/20/2024 02:42   CT Angio Chest PE W and/or Wo Contrast Result Date: 06/20/2024 CLINICAL DATA:  Shortness of breath. EXAM: CT ANGIOGRAPHY CHEST WITH CONTRAST TECHNIQUE: Multidetector CT imaging of the chest was performed using the standard protocol during bolus administration of intravenous contrast. Multiplanar CT image reconstructions and MIPs were obtained to evaluate the vascular anatomy. RADIATION DOSE REDUCTION: This exam was performed according to the departmental dose-optimization program which includes automated exposure control, adjustment of the mA and/or kV according to patient size and/or use of iterative reconstruction technique. CONTRAST:  75mL OMNIPAQUE  IOHEXOL  350 MG/ML SOLN COMPARISON:  Mar 23, 2024 FINDINGS: Cardiovascular: There is moderate severity calcification of the thoracic aorta, without evidence of aortic aneurysm. Satisfactory opacification of the pulmonary arteries to the segmental level. No evidence of pulmonary embolism. There is mild cardiomegaly with marked severity coronary artery calcification. An artificial mitral valve  is seen. No pericardial effusion. Mediastinum/Nodes: Subcentimeter AP window and pretracheal lymph nodes are seen. Thyroid  gland, trachea, and esophagus demonstrate no significant findings. Lungs/Pleura: Predominantly stable mild lateral right middle lobe, anterolateral right lower lobe and posterior left basilar scarring and/or atelectasis is seen. A stable 5 mm right middle lobe pulmonary nodule is noted (axial CT image 81, CT series 6). No acute infiltrate, pleural effusion or pneumothorax is identified. Upper Abdomen: Surgical clips are seen within the gallbladder fossa. Musculoskeletal: Multiple right renal cysts are seen. Moderate severity right-sided perinephric inflammatory fat stranding is also noted. Review of the MIP images confirms the above findings. IMPRESSION: 1. No evidence of pulmonary embolism or other acute intrathoracic process. 2. Stable 5 mm right middle lobe pulmonary nodule. No follow-up needed if patient is low-risk.This recommendation follows the consensus statement: Guidelines for Management of Incidental Pulmonary Nodules Detected on CT Images: From the Fleischner Society 2017; Radiology 2017; 284:228-243. 3. Multiple right renal cysts. 4. Moderate severity right-sided perinephric inflammatory fat stranding which may represent sequelae associated with acute pyelonephritis. Correlation with urinalysis is recommended. 5. Aortic atherosclerosis. Electronically Signed   By: Suzen Dials M.D.   On: 06/20/2024 02:36   DG Abdomen Acute W/Chest Result Date: 06/20/2024 CLINICAL DATA:  Shortness of breath and abdominal bloating. EXAM: DG ABDOMEN ACUTE WITH 1 VIEW CHEST COMPARISON:  June 08, 2024 FINDINGS: There is no evidence of dilated bowel loops or free intraperitoneal air. Radiopaque surgical clips are seen overlying the right upper quadrant. No radiopaque calculi or other significant radiographic abnormality is seen. The cardiac silhouette is stable in size and appearance. An  artificial mitral valve is noted. Stable stimulator and stimulator wire positioning is seen on the right. Both lungs are clear. IMPRESSION: Negative abdominal radiographs. No acute cardiopulmonary disease. Electronically Signed   By: Suzen Dials M.D.   On: 06/20/2024 00:41    Medications Ordered in ED Medications  iohexol  (OMNIPAQUE ) 350 MG/ML injection 75 mL (75 mLs Intravenous Contrast Given 06/20/24 0226)   Procedures Procedures  (including critical care time) Medical Decision Making / ED Course   Medical Decision Making Amount and/or Complexity of Data Reviewed Labs: ordered. Radiology: ordered.  Risk  Prescription drug management.    Shortness of breath and abdominal bloating differential diagnosis considered  On exam patient does not have evidence of volume overload.  Possible intra-abdominal fluid retention. CT scan of the abdomen obtained to rule out obstruction and negative for ascites, intra-abdominal inflammatory/infectious processes.  Minimal stool in the intestines.  Labs reassuring without leukocytosis.  No anemia with stable hemoglobin.  No significant electrolyte derangements.  Consistent with ESRD at baseline.  No evidence of bili obstruction or pancreatitis.  Patient is on Coumadin  and INR subtherapeutic.  CTA obtained to rule out PE and negative.  Incidentally, right perinephric stranding noted but patient has no right abdominal pain concerning for pyelonephritis.  Clinical picture is not concerning for it either.     Final Clinical Impression(s) / ED Diagnoses Final diagnoses:  Abdominal bloating  Orthopnea   The patient appears reasonably screened and/or stabilized for discharge and I doubt any other medical condition or other [facility] requiring further screening, evaluation, or treatment in the ED at this time. I have discussed the findings, Dx and Tx plan with the patient/family who expressed understanding and agree(s) with the plan. Discharge  instructions discussed at length. The patient/family was given strict return precautions who verbalized understanding of the instructions. No further questions at time of discharge.  Disposition: Discharge  Condition: Good  ED Discharge Orders     None        Follow Up: [name], NP [address] [place] [address] [phone]  Call  to schedule an appointment for close follow up    This chart was dictated using voice recognition software.  Despite best efforts to proofread,  errors can occur which can change the documentation meaning.    Trine Raynell Moder, MD 06/20/24 [phone]

## 2024-06-30 ENCOUNTER — Ambulatory Visit (INDEPENDENT_AMBULATORY_CARE_PROVIDER_SITE_OTHER): Admitting: Allergy

## 2024-06-30 ENCOUNTER — Other Ambulatory Visit: Payer: Self-pay

## 2024-06-30 VITALS — BP 136/80 | HR 94 | Temp 98.3°F

## 2024-06-30 DIAGNOSIS — R0602 Shortness of breath: Secondary | ICD-10-CM | POA: Diagnosis not present

## 2024-06-30 DIAGNOSIS — J4530 Mild persistent asthma, uncomplicated: Secondary | ICD-10-CM | POA: Diagnosis not present

## 2024-06-30 MED ORDER — IPRATROPIUM-ALBUTEROL 0.5-2.5 (3) MG/3ML IN SOLN: 3.0000 mL | Freq: Four times a day (QID) | RESPIRATORY_TRACT | 1 refills | Status: AC | PRN

## 2024-06-30 NOTE — Progress Notes (Unsigned)
 Follow-up Note  RE: Orell Hurtado Derick MRN: [id number] DOB: Jan 22, 1966 Date of Office Visit: 06/30/2024   History of present illness: Albert Eaton is a 58 y.o. male presenting today for acute visit for shortness of breath.  He has history of asthma, OSA, allergic rhinitis and was last seen in the office on 11/17/2023 by Dr. Iva his primary allergist. Discussed the use of AI scribe software for clinical note transcription with the patient, who gave verbal consent to proceed.  History of Present Illness   Brinson Tozzi Heckstall [place] is a 58 year old male with asthma who presents with shortness of breath and chest pain.  He has been experiencing episodes of waking up from sleep with an inability to breathe, described as 'short of breath' and 'gasping for air.' These episodes have been occurring over the past three weeks. He also reports chest pain today, along with mucus in the back of his throat and a dry cough.  He uses an albuterol  inhaler and Symbicort  for asthma management. Symbicort  is prescribed once daily, but he uses it multiple times a day. Neither medication has been effective in alleviating his symptoms.  A cardiac evaluation, including EKGs and a CT scan, was performed. A PET stress test is scheduled for October. Imaging studies included evaluation of a pulmonary nodule.  No fever, night sweats, chills, nausea, vomiting, diarrhea, or dizziness. He experienced nausea a few weeks prior to the onset of his current symptoms, occurring randomly at any time of day or night.  He has a history of reflux and takes famotidine  and Protonix  for management. No recent changes in his dialysis regimen.       Review of systems: 10pt ROS negative unless noted above in HPI  Past medical/social/surgical/family history have been reviewed and are unchanged unless specifically indicated below.  No changes  Medication List: Current Outpatient Medications  Medication Sig Dispense Refill    allopurinol  (ZYLOPRIM ) 100 MG tablet TAKE ONE TABLET BY MOUTH EVERY DAY 90 tablet 0   amLODipine  (NORVASC ) 2.5 MG tablet Take 1 tablet (2.5 mg total) by mouth daily at 8 pm. 90 tablet 3   azelastine  (ASTELIN ) 0.1 % nasal spray place TWO SPRAY IN EACH NOSTRIL TWICE DAILY 30 mL 0   B Complex-C-Zn-Folic Acid  (DIALYVITE  800/ZINC  PO) Take 1 tablet by mouth daily.     bictegravir-emtricitabine -tenofovir  AF (BIKTARVY ) 50-200-25 MG TABS tablet Take 1 tablet by mouth daily. 30 tablet 11   cetirizine  (ZYRTEC ) 10 MG tablet TAKE ONE TABLET BY MOUTH EVERY DAY AT 9.00 IN THE MORNING 90 tablet 1   cinacalcet  (SENSIPAR ) 90 MG tablet Take 90 mg by mouth daily.     diclofenac  Sodium (VOLTAREN ) 1 % GEL Apply 2 g topically 4 (four) times daily. Apply to chest pain when lidocaine  patch not in place 150 g 0   Doxercalciferol  (HECTOROL  IV) 8 mcg.     ezetimibe  (ZETIA ) 10 MG tablet Take 10 mg by mouth daily.     famotidine  (PEPCID ) 20 MG tablet Take 1 tablet (20 mg total) by mouth 2 (two) times daily. 180 tablet 3   ferric citrate  (AURYXIA ) 1 GM 210 MG(Fe) tablet Take 420 mg by mouth See admin instructions. Take 420 mg by mouth three times a day with meals and 210 mg with each snack     fluticasone  (FLOVENT  HFA) 110 MCG/ACT inhaler Inhale 2 puffs into the lungs 2 (two) times daily as needed (for flares).     gabapentin  (NEURONTIN ) 300 MG  capsule Take 1 capsule (300 mg total) by mouth daily as needed (pain/neuropathy).     hydrocortisone  (ANUSOL -HC) 25 MG suppository Place 1 suppository (25 mg total) rectally at bedtime. 10 suppository 0   ipratropium (ATROVENT ) 0.06 % nasal spray instill TWO SPRAYS IN EACH NOSTRIL 2-3 times DAILY AS NEEDED 60 mL 2   ipratropium-albuterol  (DUONEB) 0.5-2.5 (3) MG/3ML SOLN Take 3 mLs by nebulization every 6 (six) hours as needed (Shortness of breath, cough, wheeze or chest tightness). 75 mL 1   iron  sucrose (VENOFER ) 20 MG/ML injection Inject into the vein 3 (three) times a week.     iron   sucrose in sodium chloride  0.9 % 100 mL 50 mg.     isosorbide  mononitrate (IMDUR ) 30 MG 24 hr tablet Take 1 tablet (30 mg total) by mouth at bedtime. 90 tablet 3   lactulose  (CHRONULAC ) 10 GM/15ML solution Take 45 mLs (30 g total) by mouth daily as needed. Take 15-30 ml up to twice daily for constipation 1800 mL 11   levalbuterol  (XOPENEX  HFA) 45 MCG/ACT inhaler INHALE TWO puffs into THE lungs EVERY FOUR HOURS AS NEEDED wheezing 15 g 1   levothyroxine  (SYNTHROID ) 25 MCG tablet Take 1 tablet (25 mcg total) by mouth daily before breakfast. 90 tablet 1   lubiprostone  (AMITIZA ) 24 MCG capsule Take 1 capsule (24 mcg total) by mouth 2 (two) times daily with a meal. NEEDS APPT FOR FURTHER REFILLS 180 capsule 0   Methoxy PEG-Epoetin  Beta (MIRCERA IJ) every 30 (thirty) days.     metoprolol  tartrate (LOPRESSOR ) 25 MG tablet TAKE ONE TABLET BY MOUTH TWICE DAILY 180 tablet 3   Olopatadine  HCl (PATADAY ) 0.7 % SOLN Place 1 drop into both eyes 2 (two) times daily as needed (allergies). 2.5 mL 5   pantoprazole  (PROTONIX ) 40 MG tablet Take 1 tablet (40 mg total) by mouth daily. 30 tablet 5   rOPINIRole  (REQUIP ) 1 MG tablet Take 1 tablet (1 mg total) by mouth at bedtime. 90 tablet 2   rosuvastatin  (CRESTOR ) 10 MG tablet Take 1 tablet (10 mg total) by mouth daily. 90 tablet 1   sevelamer  carbonate (RENVELA ) 800 MG tablet Take 2,400 mg by mouth daily.     Spacer/Aero-Holding Chambers DEVI 1 Device by Does not apply route 2 (two) times a day. 1 each 1   SYMBICORT  160-4.5 MCG/ACT inhaler Inhale 2 puffs into the lungs in the morning and at bedtime. 10.2 g 5   No current facility-administered medications for this visit.     Known medication allergies: Allergies  Allergen Reactions   Ace Inhibitors Cough and Other (See Comments)     Physical examination: Blood pressure 136/80, pulse 94, temperature 98.3 F (36.8 C), SpO2 97%.  General: Alert, interactive, in no acute distress. HEENT: PERRLA, TMs pearly gray,  turbinates non-edematous without discharge, post-pharynx non erythematous. Neck: Supple without lymphadenopathy. Lungs: Clear to auscultation without wheezing, rhonchi or rales. {no increased work of breathing. CV: Normal S1, S2 without murmurs. Abdomen: Nondistended, nontender. Skin: Large bandage at home for left arm dialysis site. Extremities:  No clubbing, cyanosis or edema. Neuro:   Grossly intact.  Diagnostics/Labs: DuoNeb given in office due to shortness of breath attack and post nebulization he reported that his chest felt less tight.  Assessment and plan:   Shortness of breath - Episodes of shortness of breath attacks most likely due to panic/anxiety. Witnessed shortness of breath and tach in the office today that did essentially self resolve with deep breathing after about 5  minutes.  Lungs were clear during this time. DuoNeb provided in the office after the attack already ended but did report he felt less chest tightness.  Discussed that he might have some component of airway inflammation that is causing, consistent chest tightness that spasms and causes the shortness of breath attacks.  Will provide him with a nebulizer today as well as DuoNeb to use at home as needed - He has had cardiac workup with EKG that has been unremarkable as well as chest angiogram and CT abdomen that were both unrevealing of any acute abnormalities that could cause his shortness of breath episodes. - He is already on reflux medications including famotidine  and Protonix   Mild persistent asthma - Daily controller medication(s): Symbicort  160/4.51mcg two puffs one to two times with spacer - Prior to physical activity: albuterol  2 puffs 10-15 minutes before physical activity. - Rescue medications: albuterol  4 puffs or DuoNeb via nebulizer every 4-6 hours as needed - Changes during respiratory infections or worsening symptoms: INCREASE Symbicort  to two puffs TWICE DAILY for ONE TO TWO WEEKS. - Asthma  control goals:  * Full participation in all desired activities (may need albuterol  before activity) * Albuterol  use two time or less a week on average (not counting use with activity) * Cough interfering with sleep two time or less a month * Oral steroids no more than once a year * No hospitalizations  Obstructive sleep apnea - Has had placement of inspire device  Chronic allergy rhinoconjunctivitis - Continue with Pataday  one drop per eye twice daily. - Continue with Systane eye drops as needed. - Continue with azelastine  AS NEEDED.  - Continue with ipratropium AS NEEDED.  - Continue with Zyrtec  (cetirizine ) daily .  Follow-up with Dr. Iva in about 6 to 8 weeks I appreciate the opportunity to take part in [facility] care. Please do not hesitate to contact me with questions.  Sincerely,   Danita Brain, MD Allergy/Immunology [facility] 

## 2024-06-30 NOTE — Patient Instructions (Addendum)
 Shortness of breath - Episodes of shortness of breath attacks most likely due to panic/anxiety. Witnessed shortness of breath and tach in the office today that did essentially self resolve with deep breathing after about 5 minutes.  Lungs were clear during this time. DuoNeb provided in the office after the attack already ended but did report he felt less chest tightness.  Discussed that he might have some component of airway inflammation that is causing, consistent chest tightness that spasms and causes the shortness of breath attacks.  Will provide him with a nebulizer today as well as DuoNeb to use at home as needed - He has had cardiac workup with EKG that has been unremarkable as well as chest angiogram and CT abdomen that were both unrevealing of any acute abnormalities that could cause his shortness of breath episodes. - He is already on reflux medications including famotidine  and Protonix   Mild persistent asthma - Daily controller medication(s): Symbicort  160/4.42mcg two puffs one to two times with spacer - Prior to physical activity: albuterol  2 puffs 10-15 minutes before physical activity. - Rescue medications: albuterol  4 puffs or DuoNeb via nebulizer every 4-6 hours as needed - Changes during respiratory infections or worsening symptoms: INCREASE Symbicort  to two puffs TWICE DAILY for ONE TO TWO WEEKS. - Asthma control goals:  * Full participation in all desired activities (may need albuterol  before activity) * Albuterol  use two time or less a week on average (not counting use with activity) * Cough interfering with sleep two time or less a month * Oral steroids no more than once a year * No hospitalizations  Obstructive sleep apnea - Has had placement of inspire device  Chronic allergy rhinoconjunctivitis - Continue with Pataday  one drop per eye twice daily. - Continue with Systane eye drops as needed. - Continue with azelastine  AS NEEDED.  - Continue with ipratropium AS NEEDED.   - Continue with Zyrtec  (cetirizine ) daily .  Follow-up with Dr. Iva in about 6 to 8 weeks

## 2024-07-01 ENCOUNTER — Encounter: Payer: Self-pay | Admitting: Allergy

## 2024-07-04 ENCOUNTER — Other Ambulatory Visit: Payer: Self-pay | Admitting: Internal Medicine

## 2024-07-05 ENCOUNTER — Ambulatory Visit

## 2024-07-14 ENCOUNTER — Ambulatory Visit: Admitting: Pulmonary Disease

## 2024-07-14 LAB — HM HIV SCREENING LAB: HM HIV Screening: NEGATIVE

## 2024-07-14 LAB — LAB REPORT - SCANNED: Creatinine, POC: 38.4 mg/dL

## 2024-07-18 ENCOUNTER — Ambulatory Visit: Attending: Cardiovascular Disease

## 2024-07-18 DIAGNOSIS — I4892 Unspecified atrial flutter: Secondary | ICD-10-CM

## 2024-07-18 DIAGNOSIS — Z7901 Long term (current) use of anticoagulants: Secondary | ICD-10-CM | POA: Diagnosis present

## 2024-07-18 DIAGNOSIS — I480 Paroxysmal atrial fibrillation: Secondary | ICD-10-CM

## 2024-07-18 DIAGNOSIS — Z9889 Other specified postprocedural states: Secondary | ICD-10-CM

## 2024-07-18 LAB — POCT INR: INR: 1.2 — AB (ref 2.0–3.0)

## 2024-07-18 NOTE — Patient Instructions (Signed)
 Take 3 tablets Tuesday then continue taking 2 tablets daily, except 1 tablet every Monday and Friday.  Recheck INR in 2 weeks. Patient has hospital follow up 9/23, will determine if coumadin  is to be restarted.  He will call us  with information. Coumadin  Clinic [phone]

## 2024-07-18 NOTE — Progress Notes (Signed)
 INR 1.2 Please see anticoagulation encounter Take 3 tablets Tuesday then continue taking 2 tablets daily, except 1 tablet every Monday and Friday.  Recheck INR in 2 weeks. Patient has hospital follow up 9/23, will determine if coumadin  is to be restarted.  He will call us  with information. Coumadin  Clinic [phone]

## 2024-07-19 ENCOUNTER — Other Ambulatory Visit (HOSPITAL_COMMUNITY): Payer: Self-pay

## 2024-07-19 DIAGNOSIS — C642 Malignant neoplasm of left kidney, except renal pelvis: Secondary | ICD-10-CM

## 2024-07-26 NOTE — Progress Notes (Signed)
 [facility]  TRANSPLANT FOLLOW UP   PCP: Wendee Lynwood Kid, AGNP  Kidney transplant coordinator: Tinnie CHRISTELLA Hanlon  Date of Visit at Transplant clinic: 07/27/2024   Assessment/Recommendations:   # s/p deceased donor kidney transplant 07/07/2024  07/07/2024 (Kidney) Graft function: experienced DGF, last dialysis 07/18/24  DSAs: per protocol  Isotiters for planned A2 to B: Lab Results  Component Value Date   ISOA Titers to 8 07/25/2024   ISOA Titers to 8 07/21/2024   ISOA Titers to 16 07/19/2024   ISOA Titers to 2 07/14/2024   Biomarkers for early detection of rejection: No results found for: [place] [facility], TRAC, NTRACURRENTR  Post-surgical issues: - aspirin  for 1 year post-op - ureteral stent removal scheduled 08/11/24 - Large left upper arm AV fistula   - Attention at follow-ups, likely contributing to his cardiac symptoms given large size and close distance to heart (orthopnea, chest pressure)  Biopsies: none  # Immunosuppression Tacrolimus (Prograf) 4 mg BID, last dose adjustment 9/30, goal trough 8-10 ng/mL  - Last dose 9:00 PM Mycophenolate (Myfortic) 360 mg TID Prednisone  5 mg daily  # Acute issues today Stable orthopnea/chest pressure: Present for years prior to transplant. His large AV fistula is likely contributing to his symptoms. Will start Imdur  30mg , which his Cardiologist had recommended. Encouraged to reschedule PET CT with his Cardiologist on 08/01/24. Close attention to AV fistula on follow-up, and may benefit from surgical reduction when he is farther out from his transplant. Midline back pain: Present since morning of visit, improving. No red flag signs like fever/chills or paraspinal tenderness. Likely MSK. Continue to monitor.  # BP management  Goal 130/80, as tolerated. - Metoprolol  tartrate 12.mg BID  - Start Imdur  30mg    # Infectious disease CMV D+/R+, EBV D+/R+, HCV donor Ab-, NAT- CMV ppx:  Valcyte x 3 months (EOT  10/06/2024)  PJP ppx:Bactrim x 6 months (EOT 01/12/2025)  # Heme Lab Results  Component Value Date   HGB 10.1 (L) 07/27/2024   LABIRON 71 (H) 07/12/2024   FERRITIN >3,300.0 (H) 07/13/2024    # Cardiovascular: secondary prevention (nonobstruvie CAD) The ASCVD Risk score (Arnett DK, et al., 2019) failed to calculate. Paroxysmal Afib- per surgery hold anticoag until 4 weeks post (we will continue to hold pending biopsy window) He does have history of minimal invasive mitral valve repair Oct 2023. Experienced a PEA arrest on dialysis in 2023. LHC 09/11/2022: LAD proximal 50, mid 50; OM2 30; RCA proximal 40  Resume Crestor  10 mg nightly  # CKD-BMD Lab Results  Component Value Date   PTH 1,312.1 (H) 07/13/2024   CALCIUM  9.5 07/27/2024   PHOS 2.8 07/27/2024  Update Vitamin D  25OH level  # Electrolytes Lab Results  Component Value Date   MG 1.6 07/27/2024   K 3.5 07/27/2024  Mg keep on supp for cardiac history - continue Mag AA 1 tablet BID  K acceptable  # Comorbidities HIV well controlled on Biktarvy , follows with [facility]. Has [facility] app with Dr. Selma 08/22/24 as well (transplant protocol) Consider repeat Chest CT in 1 year (05/2025) given incidental 5mm pulmonary nodule found on CTA  # Immunizations Immunization History  Administered Date(s) Administered  . COVID-19 VAC,BIVALENT(31YR UP),PFIZER 09/13/2021, 03/11/2022  . COVID-19 VAC,MRNA,TRIS(12Y UP)(PFIZER)(GRAY CAP) 06/13/2020, 05/28/2021  . COVID-19 VACC,MRNA,(PFIZER)(PF) 01/06/2020, 02/01/2020, 06/13/2020  . Covid-19 Vac, (49yr+) (Comirnaty) Mrna Pfizer  08/18/2022, 07/09/2023  . Influenza Vaccine Quad(IM)6 MO-Adult(PF) 10/10/2016, 08/31/2017, 07/08/2018  . Influenza Virus Vaccine, unspecified formulation 07/08/2018,  05/23/2019  . MENINGOCOCCAL VACCINE, A,C,Y, W-135(IM)(MENVEO) 10/15/2016, 08/31/2017  . Meningococcal C Conjugate 10/15/2016, 08/31/2017  . Meningococcal Conjugate MCV4P 10/15/2016, 08/31/2017  .  PNEUMOCOCCAL POLYSACCHARIDE 23-VALENT 10/15/2016, 10/13/2019  . Pneumococcal Conjugate 13-Valent 12/29/2018  . SHINGRIX-ZOSTER VACCINE (HZV),RECOMBINANT,ADJUVANTED(IM) 06/13/2020, 08/12/2020, 07/27/2021, 08/27/2021  . SMALLPOX,MPOX(PF)(JYNNEOS) 06/06/2021, 07/04/2021  . TdaP 01/27/2017    # Cancer screening Colonoscopy: due 05/03/2031 Skin: recommend dermatology evaluation for skin cancer screening starting 1 year post transplant  # Follow up: Labs to twice a week (but repeat sooner if tac dose changes) Return in about 2 weeks (around 08/10/2024).  Kidney Transplant History:  Date of Transplant: 07/07/2024 (Kidney) Type of Transplant: DDKT (DCD, A2 to B) KDPI: 74% Cold ischemic time: 832 minutes (13hr ) Warm ischemic time: 31 minutes cPRA: 0% HLA match: 5/6 mismatch ID: CMV D+/R+, EBV D+/R+, HCV donor Ab-, NAT- Native Kidney Disease:  Native kidney biopsy: yes 2018 focal sclerosing arteriosclerosis (nonspecific)  Pre-transplant dialysis course: started dialysis 2019 Post-Transplant Course:   DGF: yes  Other complications: AMS in setting of supratherapeutic tacrolimus, diarrhea with Cdiff/GIPP negative Prior Transplants: None Induction: thymo Early steroid withdrawal: No (due to HIV)   History of Presenting Illness:   Since the last visit: - has orthopnea + chest pressure, though this is stable. orthopnea prevents him from sleeping flat, so he sits up to sleep. he has the Brilliant device, which he is able to turn on if he is able to fall asleep. he is unsure if it is helping. he got this placed with [facility].  - making robust urine output (3L) - not checking home BPs - energy starting to improve. lying in bed less now. can walk 1 mile to [employer]. has SOB when he does so, but this is a chronic issue for him. no palpitations or severe chest pain. chest pressure, like something is sitting on chest, when lying flat. resolves spontaneously.  Concerns about nonadherence:  No  Social: Lives in [place]. Nephew taking care of him.  Review of Systems:  A 12-system review was negative except as documented in the HPI.  Physical Exam:   BP 151/82 (BP Site: R Arm, BP Position: Sitting, BP Cuff Size: Large)   Pulse 92   Temp 36.4 C (97.6 F) (Temporal)   Ht 177.8 cm (5' 10)   Wt 97.5 kg (215 lb)   BMI 30.85 kg/m  Constitutional:  Well-appearing in NAD Eyes:  anicteric sclerae ENT:  MMM CV:  RRR, no m/r/g, no JVD, extremities WWP with no edema Resp:  Good air movement, CTAB Abdomen: soft, NTND, +bs, surgical staples in place MSK:  Grossly normal, exam is limited, no paraspinal tenderness Skin:  Normal turgor, no rash Neuro:  Grossly normal, exam is limited Psych:  Normal affect Dialysis access: large left arm AVF with palpable thrill   Allergies: Allergies[1]   Current Medications: Current Medications[2]  Past Medical History:  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    Electronically signed by:  Selinda Ruth, MD        [1] Allergies Allergen Reactions  . Ace Inhibitors Cough  . Lisinopril Cough  [2] Current Outpatient Medications  Medication Sig Dispense Refill  . acetaminophen  (TYLENOL ) 500 MG tablet  Take 1-2 tablets (500-1,000 mg total) by mouth every eight (8) hours as needed for pain or fever. 100 tablet 11  . allopurinol  (ZYLOPRIM ) 100 MG tablet Take 1 tablet (100 mg total) by mouth daily. 30 tablet 11  . aspirin  (ECOTRIN) 81 MG tablet Take 1 tablet (81 mg total) by mouth daily. 30 tablet 11  . BIKTARVY  50-200-25 mg tablet Take 1 tablet by mouth  daily.    . budesonide -formoterol  (SYMBICORT ) 160-4.5 mcg/actuation inhaler Inhale 2 puffs two (2) times a day.    . cetirizine  (ZYRTEC ) 10 MG tablet Take 1 tablet (10 mg total) by mouth daily.    . gabapentin  (NEURONTIN ) 100 MG capsule Take 1 capsule (100 mg total) by mouth Three (3) times a day. 90 capsule 1  . ipratropium (ATROVENT ) 42 mcg (0.06 %) nasal spray 1 spray into each nostril daily as needed for rhinitis.    SABRA ipratropium-albuterol  (DUO-NEB) 0.5-2.5 mg/3 mL nebulizer Inhale 3 mL by nebulization every four (4) hours as needed (shortness of breath).    . isosorbide  mononitrate (IMDUR ) 30 MG 24 hr tablet Take 1 tablet (30 mg total) by mouth daily. 30 tablet 11  . levalbuterol  (XOPENEX  HFA) 45 mcg/actuation inhaler Inhale 1-2 puffs every four (4) hours as needed for wheezing or shortness of breath.    . levothyroxine  (SYNTHROID ) 25 MCG tablet Take 1 tablet (25 mcg total) by mouth daily. 30 tablet 11  . lubiprostone  (AMITIZA ) 24 MCG capsule Take 1 capsule (24 mcg total) by mouth daily as needed for constipation. 30 capsule 2  . magnesium  oxide-Mg AA chelate (MAGNESIUM , AMINO ACID CHELATE,) 133 mg Take 1 tablet by mouth two (2) times a day. 60 tablet 11  . melatonin 3 mg Tab Take 1 tablet (3 mg total) by mouth every evening. 30 tablet 11  . methocarbamol  (ROBAXIN ) 500 MG tablet Take 1 tablet (500 mg total) by mouth Three (3) times a day as needed (muscle spasms). 90 tablet 0  . metoPROLOL  tartrate (LOPRESSOR ) 25 MG tablet Take 1/2 tablets(12.5 mg total) by mouth two (2) times a day. 30 tablet 11  . mycophenolate (MYFORTIC) 180 MG EC tablet Take 2 tablets (360 mg total) by mouth Three (3) times a day. 180 tablet 11  . pantoprazole  (PROTONIX ) 40 MG tablet Take 1 tablet (40 mg total) by mouth daily before breakfast. 30 tablet 11  . predniSONE  (DELTASONE ) 5 MG tablet Take 1 tablet (5 mg total) by mouth in the morning. 30 tablet 11  . rOPINIRole  (REQUIP ) 1 MG tablet Take 1 tablet (1 mg total) by  mouth nightly.    . rosuvastatin  (CRESTOR ) 10 MG tablet Take 1 tablet (10 mg total) by mouth daily. 100 tablet 3  . sulfamethoxazole-trimethoprim (BACTRIM DS) 800-160 mg per tablet Take 1 tablet (160 mg of trimethoprim total) by mouth every Monday, Wednesday, and Friday. 12 tablet 11  . tacrolimus (PROGRAF) 1 MG capsule Take 4 capsules (4 mg total) by mouth daily AND 4 capsules (4 mg total) nightly. 240 capsule 11  . valGANciclovir (VALCYTE) 450 mg tablet Take 1 tablet (450 mg total) by mouth every other day. 15 tablet 2   No current facility-administered medications for this visit.

## 2024-07-27 NOTE — Progress Notes (Signed)
 I saw and evaluated the patient with Dr. Jama, participating in the key portions of the service. I reviewed the patient's laboratory data and other clinical information. I agree with the history, examination, and medical decision making as noted.    He has dypsnea, orthopnea, with positional chest pain. He saw his cardiologist pre-transplant 06/15/24; [name] was concerned for possible progression of CAD and ordered NM cardiac stress test, but this was not performed prior to transplant. He was prescribed Imdur 30 mg daily at that visit.     Plan:  - CV symptoms: has cardiology appt 10/6, he will coordinate the planned stress test with his cardiologist, strongly advised to go to ER if symptoms worsen due to high-risk period for CV events post-transplant. Starting the Imdur 30 mg today. His large upper arm AVF may contribute to cardiac symptoms - in future assess potential for AVF reduction/ligation.  - IS: eval today's tacrolimus  level and adjust as needed. Previously supratherapeutic as inpatient on max 6 mg BID - currently 4 mg BID with dose adjusted 9/29  - kidney function remains borderline - DCD kidney may explain this but has risk factors for rejection (HIV status, subtherapeutic tac, and A2 to B). If kidney not clearly improving next week, we will arrange for biopsy.    Almarie GORMAN Christine, MD  Transplant Nephrology

## 2024-07-29 ENCOUNTER — Encounter (HOSPITAL_COMMUNITY): Payer: Self-pay

## 2024-07-29 ENCOUNTER — Encounter (HOSPITAL_BASED_OUTPATIENT_CLINIC_OR_DEPARTMENT_OTHER): Admitting: Internal Medicine

## 2024-08-01 ENCOUNTER — Ambulatory Visit

## 2024-08-01 ENCOUNTER — Telehealth (HOSPITAL_COMMUNITY): Payer: Self-pay | Admitting: Emergency Medicine

## 2024-08-01 NOTE — Progress Notes (Signed)
 The patient reports they are physically located in [place]  and is currently: at home. I conducted a phone visit.  I spent 30 minutes on the phone call with the patient on the date of service .       **THIS PATIENT WAS NOT SEEN IN PERSON TO MINIMIZE POTENTIAL SPREAD OF COVID-19, PROTECT PATIENTS/PROVIDERS, AND REDUCE PPE UTILIZATION.**    PATIENT NAME: Albert Eaton     MR#: [id number]    DOB: 03-May-1966    [facility]  CONFIDENTIAL SOCIAL WORK   KIDNEY POST TRANSPLANT FOLLOW UP      DATE OF EVALUATION: 08/01/2024    INFORMANTS: Willian D Coombes    PREFERRED LANGUAGE: English     INTERPRETER UTILIZED: N/A    TXP CARE TEAM:   Post Transplant RN CoordinatorBETHA Tinnie Hanlon  [phone]  Primary Transplant Provider: Almarie Bonine, [address], Bruno LOISE Mania, Heinz Rumalda Marsa Sam Aundria, Eurice P Scott, [place], [place] Kapoor    REFERRAL INFORMATION:    Mr.  Jezek is a 58 y.o. African-American male is s/p transplant for kidney transplantation . CSW follows up to assess recovery since transplant.    TRANSPLANT DATE:   07/07/2024 (Kidney)    MOST RECENT HOSPITAL ADMISSION (@ [place]):   Previous admit date: 07/08/2024 to 07/14/24    FUTURE APPOINTMENTS (@ [place]):   Future Appointments   Date Time Provider Department Center   08/01/2024 10:00 AM Hilbert Delon LITTIE KEN [facility]   08/04/2024 10:30 AM [facility] US  RM 5 IUSUW [place]   08/10/2024  8:00 AM Sandre Almarie RAMAN, MD [facility]   08/11/2024  1:45 PM [facility] PROCED[facility]   08/22/2024  3:00 PM Gay, Montie Massa, MD [facility]       HOME HEALTH/DME NEEDS AT LAST DC:   HH: HH recommended but unable to secure in pt's home area   Services: N/A   Contact: n/a  DME: Rollator Walker (w/ wheels and seat)  Other: N/A    DIALYSIS:  Needed at time of DC: Yes  Dialysis History        Start End Type Center Comments    10/01/2020 07/07/2024 Home Hemodialysis [facility]     05/13/2019 10/01/2020 In-center Hemodialysis [facility]               Current Dialysis Center Information    No center information to display.               **did a few days of HD after d/c but none current    LIFESTYLE:  Physical activity:  Good relative to prior to txp; have had issues before txp w/ SOB; laying down at night is hard and makes sleep harder  Nutrition/Appetite:  Good  Sleep: Fair; put need to be on an incline to sleep (elevated head);     SOCIAL HISTORY:  Citizenship Status: [place]  Citizen  Personal History: Born and raised in [place]; no relationship with father who resides in [place]; strong relationship with his mother who resides in [place]; 3 siblings; one sister in [place]   Marital Status: widowed; partner passed in 2013   Lives with: by himself  Children/Dependents:  no  Social support: family  Housing: good repair    CAREGIVING PLAN:  Location:  recovering at home w/ niece/Erica (40) she has moved in temporarily and is able to work from the home  Support/Caregivers:  niece  Household tasks/errands:  niece  Medication:  niece  Transportation: nephew/Michael has been the driver (also works from home)    COMPLIANCE HISTORY:  Medication Adherence: Good  Medication Concerns: denied problems taking medications, concerns about side effects, affordability, problems obtaining medications, and difficulty remembering medications  Other Adherence: Good     Side Effects: some shaking in hands, s.t. in voice (tach was just increased and pt had not shared these symptoms yet w/ team; reached out to [facility] via secure chat and shared pt's concerns; she will contact pt later today)    **emphasized importance of notifying [facility] of any side effects, issues, concerns    INSURANCE:  [employer] assistance:   Payer/Plan Subscriber Name Rel Member # Group #   [place] MEDJONA, ZAPPONE Self [id number]      [address]   [place] MGD CAL PRYOR, GUETTLER Self [id number] S       [address]     Dialysis Start Date: 05/13/2019     SSDI eligibility:     ESRD Medicare eligibility: 06/27/24-06/27/27    65th BD: 03/29/2031    30 mon COB period: N/A; expired    Access to other insurance plans?: unk    [x]  Will need to review benefits post transplant at next appointment    []  All current benefits appear to be based on age at this time and are unlikely to change.    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  Military History: no    Current income sources: SSDI  Monthly expenses: rent, other customary Radiation protection practitioner Risks and Debts: denies  Current income meets basic needs: Yes    ATTITUDE ABOUT TRANSPLANT:  Expectations:   Fears/Concerns:   What would you change?:    Rec'd Info on Ltr to Donor Family:     MENTAL HEALTH HISTORY: reflective of current   Current issues/mood:   Medications:   Therapy:   SI/HI:     SUBSTANCE HISTORY: reflective of current   Tobacco:   Alcohol:   Illicit Substances:   OTC/Supplements:     PAIN HISTORY: reflective of current  Current :   Current use of pain medication/pain control:     SUMMARY:  Spoke w/ pt at appt time.  No specific issues at this time.  Pt reports new onset of tremors/shaking since increase of tach last week.  He had not shared these yet w/ team.  This CSW reached out to TNC/Lauren P and she agreed to call pt later this morning.    Pt reports that he has had significant issues w/ SOB before txp, which seems to have only been exacerbated by surgery.  States that sleeping in a horizontal position is impossible and that after talking to a RN CM w/ his insurance plan, it was suggested that he ask about getting a script for a recliner.  This CSW was unclear on exactly this request for DME would be for?  Pt could not offer any additional details, but provided contact info for RN CM/Audrey [phone], [phone].  Called Audrey/RN CM today and left VM w/ request to return my call. Anticipatory guidance/education provided on the following:  --Verified pt/family has correct contact information for this CSW  --unable to address 2/2 time constraints; will complete at next opportunity        RECOMMENDATIONS:   1. Complete assessment  2. Review benefits

## 2024-08-01 NOTE — Telephone Encounter (Signed)
 Reaching out to patient to offer assistance regarding upcoming cardiac imaging study; pt verbalizes understanding of appt date/time, parking situation and where to check in, pre-test NPO status and medications ordered, and verified current allergies; name and call back number provided for further questions should they arise Albert Frame RN Navigator Cardiac Imaging [facility] [phone] office [phone] cell  Patient aware to avoid caffeine for 12 hours prior and hold imdur.

## 2024-08-01 NOTE — Telephone Encounter (Signed)
Attempted to call patient regarding upcoming cardiac PET appointment. Left message on voicemail with name and callback number Aidan Moten RN Navigator Cardiac Imaging [facility] [phone] Office [phone] Cell  

## 2024-08-02 ENCOUNTER — Encounter (HOSPITAL_COMMUNITY)
Admission: RE | Admit: 2024-08-02 | Discharge: 2024-08-02 | Disposition: A | Discharge status: Home / Self Care | Source: Ambulatory Visit | Attending: Cardiology | Admitting: Cardiology

## 2024-08-02 ENCOUNTER — Other Ambulatory Visit (HOSPITAL_COMMUNITY): Payer: Self-pay

## 2024-08-02 ENCOUNTER — Ambulatory Visit (HOSPITAL_BASED_OUTPATIENT_CLINIC_OR_DEPARTMENT_OTHER)
Admission: RE | Admit: 2024-08-02 | Discharge: 2024-08-02 | Disposition: A | Discharge status: Home / Self Care | Source: Ambulatory Visit

## 2024-08-02 ENCOUNTER — Encounter (HOSPITAL_COMMUNITY): Payer: Self-pay

## 2024-08-02 DIAGNOSIS — R0602 Shortness of breath: Secondary | ICD-10-CM | POA: Insufficient documentation

## 2024-08-02 DIAGNOSIS — C642 Malignant neoplasm of left kidney, except renal pelvis: Secondary | ICD-10-CM

## 2024-08-02 DIAGNOSIS — I2089 Other forms of angina pectoris: Secondary | ICD-10-CM | POA: Insufficient documentation

## 2024-08-02 MED ORDER — REGADENOSON 0.4 MG/5ML IV SOLN
0.4000 mg | Freq: Once | INTRAVENOUS | Status: DC
Start: 2024-08-02 — End: 2024-08-08

## 2024-08-03 ENCOUNTER — Emergency Department (HOSPITAL_COMMUNITY)
Admission: EM | Admit: 2024-08-03 | Discharge: 2024-08-03 | Disposition: A | Discharge status: Home / Self Care | Attending: Emergency Medicine | Admitting: Emergency Medicine

## 2024-08-03 ENCOUNTER — Inpatient Hospital Stay (HOSPITAL_COMMUNITY)
Admit: 2024-08-03 | Discharge: 2024-08-03 | Disposition: A | Discharge status: Home / Self Care | Attending: Emergency Medicine | Admitting: Emergency Medicine

## 2024-08-03 ENCOUNTER — Encounter (HOSPITAL_COMMUNITY): Payer: Self-pay

## 2024-08-03 ENCOUNTER — Other Ambulatory Visit: Payer: Self-pay

## 2024-08-03 ENCOUNTER — Telehealth: Payer: Self-pay | Admitting: *Deleted

## 2024-08-03 ENCOUNTER — Emergency Department (EMERGENCY_DEPARTMENT_HOSPITAL)

## 2024-08-03 DIAGNOSIS — R600 Localized edema: Secondary | ICD-10-CM | POA: Diagnosis not present

## 2024-08-03 DIAGNOSIS — M7989 Other specified soft tissue disorders: Secondary | ICD-10-CM

## 2024-08-03 DIAGNOSIS — E876 Hypokalemia: Secondary | ICD-10-CM | POA: Diagnosis not present

## 2024-08-03 DIAGNOSIS — R079 Chest pain, unspecified: Secondary | ICD-10-CM

## 2024-08-03 DIAGNOSIS — R2241 Localized swelling, mass and lump, right lower limb: Secondary | ICD-10-CM | POA: Diagnosis present

## 2024-08-03 DIAGNOSIS — R0602 Shortness of breath: Secondary | ICD-10-CM

## 2024-08-03 LAB — COMPREHENSIVE METABOLIC PANEL WITH GFR
ALT: 27 U/L (ref 0–44)
AST: 21 U/L (ref 15–41)
Albumin: 3.3 g/dL — ABNORMAL LOW (ref 3.5–5.0)
Alkaline Phosphatase: 134 U/L — ABNORMAL HIGH (ref 38–126)
Anion gap: 14 (ref 5–15)
BUN: 41 mg/dL — ABNORMAL HIGH (ref 6–20)
CO2: 15 mmol/L — ABNORMAL LOW (ref 22–32)
Calcium: 9 mg/dL (ref 8.9–10.3)
Chloride: 107 mmol/L (ref 98–111)
Creatinine, Ser: 4.59 mg/dL — ABNORMAL HIGH (ref 0.61–1.24)
GFR, Estimated: 14 mL/min — ABNORMAL LOW (ref >60)
Glucose, Bld: 101 mg/dL — ABNORMAL HIGH (ref 70–99)
Potassium: 3.4 mmol/L — ABNORMAL LOW (ref 3.5–5.1)
Sodium: 136 mmol/L (ref 135–145)
Total Bilirubin: 0.6 mg/dL (ref 0.0–1.2)
Total Protein: 6.5 g/dL (ref 6.5–8.1)

## 2024-08-03 LAB — CBC
HCT: 28.4 % — ABNORMAL LOW (ref 39.0–52.0)
Hemoglobin: 9.3 g/dL — ABNORMAL LOW (ref 13.0–17.0)
MCH: 32.2 pg (ref 26.0–34.0)
MCHC: 32.7 g/dL (ref 30.0–36.0)
MCV: 98.3 fL (ref 80.0–100.0)
Platelets: 367 K/uL (ref 150–400)
RBC: 2.89 MIL/uL — ABNORMAL LOW (ref 4.22–5.81)
RDW: 17.4 % — ABNORMAL HIGH (ref 11.5–15.5)
WBC: 5.3 K/uL (ref 4.0–10.5)
nRBC: 0 % (ref 0.0–0.2)

## 2024-08-03 LAB — PROTIME-INR
INR: 1.3 — ABNORMAL HIGH (ref 0.8–1.2)
Prothrombin Time: 16.8 s — ABNORMAL HIGH (ref 11.4–15.2)

## 2024-08-03 NOTE — Progress Notes (Signed)
 Right lower extremity venous  has been completed. Refer to [facility] under chart review to view preliminary results.   08/03/2024  9:19 AM Albert Eaton, Albert Eaton

## 2024-08-03 NOTE — ED Provider Notes (Signed)
 I evaluated the patient in triage.  Having right leg swelling starting over the past 24 hours.  Had kidney transplant surgery last month.  Pain seems to be extending to the entire leg, having some pain in the right groin as well.  No chest pain or shortness of breath, reassuring vital signs, no acute distress, nontoxic.  Has been recovering well from the surgery thus far.  Suspicious for DVT.  He agrees to stay for ultrasound.  I discussed the potential anticoagulation plan with pharmacy, patient would likely need to be bridged with Lovenox  and then transition to warfarin.  Per pharmacy this could be accomplished in the outpatient setting, would consider rediscussing with pharmacy and/or consulting medicine for assistance if DVT ultrasound was positive.  Labs ordered to help with potential Lovenox /warfarin dosing.   Theadore Ozell HERO, MD 08/03/24 [phone]

## 2024-08-03 NOTE — Discharge Instructions (Addendum)
 Call and follow-up with your transplant team, nephrologist and primary care doctor.  Discussed with them your leg swelling and possibly starting Lasix .  Return to the ER for new or worsening symptoms.

## 2024-08-03 NOTE — Telephone Encounter (Signed)
 Called and spoke to patient -  instruction given verbally and written sent via mychart  Patient verbalized understanding

## 2024-08-03 NOTE — ED Triage Notes (Signed)
 Pt is post op from kidney transplant at [facility], surgery was done on the 11th of September, he noticed yesterday into tonight unilateral leg swelling in the right leg going into his upper thigh hip area. There is some lower leg pitting edema +2 in grading scale. He is otherwise stable with no shortness of breath or chest pain at this time

## 2024-08-03 NOTE — Telephone Encounter (Signed)
-----   Message from Alm Clay sent at 08/02/2024  7:53 AM EDT ----- Regarding: RE: NM CARDIAC STRESS PET/CT aborted I guess we have to - but not as good a test.   Let's change to Lexiscan  Myoview    DH ----- Message ----- From: Prentiss Camie HERO, RN Sent: 08/02/2024   7:46 AM EDT To: Alm LELON Clay, MD; Reena Gladis GAILS, RN; Me# Subject: NM CARDIAC STRESS PET/CT aborted               Good morning Dr. Clay,  This patient arrived for his NM PET STRESS today at [facility] however was unable to complete it due to difficulty breathing with lying flat. The tech suggested possibly doing DSPECT so he could sit up?   Sorry for any inconvenience.  Best, Camie Prentiss

## 2024-08-03 NOTE — ED Provider Notes (Signed)
 [place] EMERGENCY DEPARTMENT AT [facility] Provider Note   CSN: [id number] Arrival date & time: 08/03/24  [id number]     Patient presents with: Leg Swelling   India BIRCH Scroggins is a 58 y.o. male.   58 year old male presents for evaluation of leg swelling.  He recently had a kidney transplant.  States he has been on blood thinners prior to that.  He has no history of DVT or PE.  States he initially noticed significant swelling in his right leg was now started to get swelling in his left foot as well.  Denies any other symptoms or concerns at this time.        Prior to Admission medications   Medication Sig Start Date End Date Taking? Authorizing Provider  allopurinol  (ZYLOPRIM ) 100 MG tablet TAKE ONE TABLET BY MOUTH EVERY DAY 06/10/24   [name], NP  amLODipine  (NORVASC ) 2.5 MG tablet Take 1 tablet (2.5 mg total) by mouth daily at 8 pm. 03/16/24   Wonda Sharper, MD  azelastine  (ASTELIN ) 0.1 % nasal spray place TWO SPRAY IN EACH NOSTRIL TWICE DAILY 06/10/24   Gallagher, Joel Louis, MD  B Complex-C-Zn-Folic Acid  (DIALYVITE  800/ZINC  PO) Take 1 tablet by mouth daily. 12/14/23   [provider]  bictegravir-emtricitabine -tenofovir  AF (BIKTARVY ) 50-200-25 MG TABS tablet Take 1 tablet by mouth daily. 07/28/23   Efrain Lamar ORN, MD  cetirizine  (ZYRTEC ) 10 MG tablet TAKE ONE TABLET BY MOUTH EVERY DAY AT 9.00 IN THE MORNING 06/03/24   Iva Marty Saltness, MD  cinacalcet  (SENSIPAR ) 90 MG tablet Take 90 mg by mouth daily. 10/08/23   [provider]  diclofenac  Sodium (VOLTAREN ) 1 % GEL Apply 2 g topically 4 (four) times daily. Apply to chest pain when lidocaine  patch not in place 09/14/22   Bryn Bernardino NOVAK, MD  Doxercalciferol  (HECTOROL  IV) 8 mcg. 03/18/24 03/17/25  [provider]  ezetimibe  (ZETIA ) 10 MG tablet Take 10 mg by mouth daily. 02/10/24   [provider]  famotidine  (PEPCID ) 20 MG tablet Take 1 tablet (20 mg total) by mouth 2 (two) times daily.  11/13/22   Iva Marty Saltness, MD  ferric citrate  (AURYXIA ) 1 GM 210 MG(Fe) tablet Take 420 mg by mouth See admin instructions. Take 420 mg by mouth three times a day with meals and 210 mg with each snack    [provider]  fluticasone  (FLOVENT  HFA) 110 MCG/ACT inhaler Inhale 2 puffs into the lungs 2 (two) times daily as needed (for flares).    [provider]  gabapentin  (NEURONTIN ) 300 MG capsule Take 1 capsule (300 mg total) by mouth daily as needed (pain/neuropathy). 09/14/22   Bryn Bernardino NOVAK, MD  hydrocortisone  (ANUSOL -HC) 25 MG suppository Place 1 suppository (25 mg total) rectally at bedtime. 04/07/24   May, Deanna J, NP  ipratropium (ATROVENT ) 0.06 % nasal spray instill TWO SPRAYS IN EACH NOSTRIL 2-3 times DAILY AS NEEDED 03/10/24   Iva Marty Saltness, MD  ipratropium-albuterol  (DUONEB) 0.5-2.5 (3) MG/3ML SOLN Take 3 mLs by nebulization every 6 (six) hours as needed (Shortness of breath, cough, wheeze or chest tightness). 06/30/24   Jeneal Danita Macintosh, MD  iron  sucrose (VENOFER ) 20 MG/ML injection Inject into the vein 3 (three) times a week. 11/27/23 11/25/24  [provider]  iron  sucrose in sodium chloride  0.9 % 100 mL 50 mg. 03/25/24 03/24/25  [provider]  isosorbide  mononitrate (IMDUR ) 30 MG 24 hr tablet Take 1 tablet (30 mg total) by mouth at bedtime.  06/15/24 09/13/24  Anner Alm ORN, MD  lactulose  (CHRONULAC ) 10 GM/15ML solution Take 45 mLs (30 g total) by mouth daily as needed. Take 15-30 ml up to twice daily for constipation 01/08/23   Pyrtle, Gordy HERO, MD  levalbuterol  (XOPENEX  HFA) 45 MCG/ACT inhaler INHALE TWO puffs into THE lungs EVERY FOUR HOURS AS NEEDED wheezing 06/03/24   Iva Marty Saltness, MD  levothyroxine  (SYNTHROID ) 25 MCG tablet Take 1 tablet (25 mcg total) by mouth daily before breakfast. 10/27/23   [name], NP  lubiprostone  (AMITIZA ) 24 MCG capsule TAKE ONE CAPSULE BY MOUTH TWICE DAILY WITH a meal 07/04/24   Pyrtle, Gordy HERO,  MD  Methoxy PEG-Epoetin  Beta (MIRCERA IJ) every 30 (thirty) days. 11/27/23 11/25/24  [provider]  metoprolol  tartrate (LOPRESSOR ) 25 MG tablet TAKE ONE TABLET BY MOUTH TWICE DAILY 06/17/24   Cooper, Michael, MD  Olopatadine  HCl (PATADAY ) 0.7 % SOLN Place 1 drop into both eyes 2 (two) times daily as needed (allergies). 11/13/22   Iva Marty Saltness, MD  pantoprazole  (PROTONIX ) 40 MG tablet Take 1 tablet (40 mg total) by mouth daily. 05/06/24   Iva Marty Saltness, MD  rOPINIRole  (REQUIP ) 1 MG tablet Take 1 tablet (1 mg total) by mouth at bedtime. 10/26/23   Neda Jennet LABOR, MD  rosuvastatin  (CRESTOR ) 10 MG tablet Take 1 tablet (10 mg total) by mouth daily. 10/19/23   Wonda Sharper, MD  sevelamer  carbonate (RENVELA ) 800 MG tablet Take 2,400 mg by mouth daily. 04/22/23   [provider]  Spacer/Aero-Holding Raguel DEVI 1 Device by Does not apply route 2 (two) times a day. 03/01/19   Bobbitt, Elgin Pepper, MD  SYMBICORT  160-4.5 MCG/ACT inhaler Inhale 2 puffs into the lungs in the morning and at bedtime. 07/01/23   Iva Marty Saltness, MD    Allergies: Ace inhibitors    Review of Systems  Constitutional:  Negative for chills and fever.  HENT:  Negative for ear pain and sore throat.   Eyes:  Negative for pain and visual disturbance.  Respiratory:  Negative for cough and shortness of breath.   Cardiovascular:  Positive for leg swelling. Negative for chest pain and palpitations.  Gastrointestinal:  Negative for abdominal pain and vomiting.  Genitourinary:  Negative for dysuria and hematuria.  Musculoskeletal:  Negative for arthralgias and back pain.  Skin:  Negative for color change and rash.  Neurological:  Negative for seizures and syncope.  All other systems reviewed and are negative.   Updated Vital Signs BP (!) 159/75 (BP Location: Right Arm)   Pulse 74   Temp 98.2 F (36.8 C) (Oral)   Resp 20   SpO2 100%   Physical Exam Vitals and nursing note reviewed.   Constitutional:      General: He is not in acute distress.    Appearance: Normal appearance. He is well-developed. He is not ill-appearing.  HENT:     Head: Normocephalic and atraumatic.  Eyes:     Conjunctiva/sclera: Conjunctivae normal.  Cardiovascular:     Rate and Rhythm: Normal rate and regular rhythm.     Heart sounds: No murmur heard. Pulmonary:     Effort: Pulmonary effort is normal. No respiratory distress.     Breath sounds: Normal breath sounds.  Abdominal:     Palpations: Abdomen is soft.     Tenderness: There is no abdominal tenderness.  Musculoskeletal:        General: No swelling.     Cervical back: Neck supple.  Right lower leg: Edema present.  Skin:    General: Skin is warm and dry.     Capillary Refill: Capillary refill takes less than 2 seconds.  Neurological:     Mental Status: He is alert.  Psychiatric:        Mood and Affect: Mood normal.     (all labs ordered are listed, but only abnormal results are displayed) Labs Reviewed  CBC - Abnormal; Notable for the following components:      Result Value   RBC 2.89 (*)    Hemoglobin 9.3 (*)    HCT 28.4 (*)    RDW 17.4 (*)    All other components within normal limits  COMPREHENSIVE METABOLIC PANEL WITH GFR - Abnormal; Notable for the following components:   Potassium 3.4 (*)    CO2 15 (*)    Glucose, Bld 101 (*)    BUN 41 (*)    Creatinine, Ser 4.59 (*)    Albumin  3.3 (*)    Alkaline Phosphatase 134 (*)    GFR, Estimated 14 (*)    All other components within normal limits  PROTIME-INR - Abnormal; Notable for the following components:   Prothrombin Time 16.8 (*)    INR 1.3 (*)    All other components within normal limits    EKG: None  Radiology: VAS US  LOWER EXTREMITY VENOUS (DVT) (ONLY [facility] & [facility]) Result Date: 08/03/2024  Lower Venous DVT Study Patient Name:  ALMER BUSHEY [facility]  Date of Exam:   08/03/2024 Medical Rec #: [id number]        Accession #:    [id number] Date of Birth: 03/13/1966          Patient Gender: M Patient Age:   39 years Exam Location:  [facility] Procedure:      VAS US  LOWER EXTREMITY VENOUS (DVT) Referring Phys: OZELL POISSON --------------------------------------------------------------------------------  Indications: Swelling, Edema, and Status post right kidney transplant on 07/07/24.  Risk Factors: Surgery Mitral valve repair 07/06/23. Left kidney removed 2022. Performing Technologist: Ricka Sturdivant-Jones RDMS, RVT  Examination Guidelines: A complete evaluation includes B-mode imaging, spectral Doppler, color Doppler, and power Doppler as needed of all accessible portions of each vessel. Bilateral testing is considered an integral part of a complete examination. Limited examinations for reoccurring indications may be performed as noted. The reflux portion of the exam is performed with the patient in reverse Trendelenburg.  +---------+---------------+---------+-----------+----------+--------------+ RIGHT    CompressibilityPhasicitySpontaneityPropertiesThrombus Aging +---------+---------------+---------+-----------+----------+--------------+ CFV      Full           Yes      Yes                                 +---------+---------------+---------+-----------+----------+--------------+ SFJ      Full                                                        +---------+---------------+---------+-----------+----------+--------------+ FV Prox  Full                                                        +---------+---------------+---------+-----------+----------+--------------+  FV Mid   Full           Yes      Yes                                 +---------+---------------+---------+-----------+----------+--------------+ FV DistalFull                                                        +---------+---------------+---------+-----------+----------+--------------+ PFV      Full                                                         +---------+---------------+---------+-----------+----------+--------------+ POP      Full           Yes      Yes                                 +---------+---------------+---------+-----------+----------+--------------+ PTV      Full                                                        +---------+---------------+---------+-----------+----------+--------------+ PERO     Full                                                        +---------+---------------+---------+-----------+----------+--------------+   +----+---------------+---------+-----------+----------+--------------+ LEFTCompressibilityPhasicitySpontaneityPropertiesThrombus Aging +----+---------------+---------+-----------+----------+--------------+ CFV Full           Yes      Yes                                 +----+---------------+---------+-----------+----------+--------------+ SFJ Full                                                        +----+---------------+---------+-----------+----------+--------------+   Summary: RIGHT: - There is no evidence of deep vein thrombosis in the lower extremity.  - No cystic structure found in the popliteal fossa.  LEFT: - No evidence of common femoral vein obstruction.   *See table(s) above for measurements and observations. Electronically signed by Lonni Gaskins MD on 08/03/2024 at 11:10:18 AM.    Final      Procedures   Medications Ordered in the ED - No data to display                                  Medical Decision Making Patient here for unilateral leg swelling.  DVT study is  negative.  I think he just has peripheral edema.  He otherwise appears well and has no complaints.  I suggested he may need some Lasix  but advised that he should call and follow-up with his primary care doctor and specialist to discuss this further.  Advised return to the ER for new or worsening symptoms.  He feels comfortable to plan we discharged home.  Problems  Addressed: Peripheral edema: acute illness or injury  Amount and/or Complexity of Data Reviewed External Data Reviewed: notes.    Details: Prior records reviewed and patient with recent kidney transplant Labs: ordered. Decision-making details documented in ED Course.    Details: Ordered and reviewed by me and unremarkable Radiology: ordered and independent interpretation performed. Decision-making details documented in ED Course.    Details: Ordered and reviewed by me and DVT study shows no evidence of DVT  Risk OTC drugs. Prescription drug management.     Final diagnoses:  Peripheral edema    ED Discharge Orders     None          Gennaro Duwaine CROME, DO 08/03/24 1144

## 2024-08-04 ENCOUNTER — Other Ambulatory Visit: Payer: Self-pay | Admitting: Cardiology

## 2024-08-04 ENCOUNTER — Telehealth (HOSPITAL_COMMUNITY): Payer: Self-pay | Admitting: *Deleted

## 2024-08-04 DIAGNOSIS — R0602 Shortness of breath: Secondary | ICD-10-CM

## 2024-08-04 DIAGNOSIS — R079 Chest pain, unspecified: Secondary | ICD-10-CM

## 2024-08-04 NOTE — Telephone Encounter (Signed)
 Called as a reminder to the patient regarding his STRESS TEST on 08/05/24 at 7:30 but the recording stated that his mailbox was full.

## 2024-08-04 NOTE — Telephone Encounter (Signed)
 I addended the original note to include nuclear stress test and signed a new nursing order [id number]

## 2024-08-04 NOTE — Addendum Note (Signed)
 Addended by: ANNER ALM ORN on: 08/04/2024 01:38 PM   Modules accepted: Orders

## 2024-08-05 ENCOUNTER — Ambulatory Visit (HOSPITAL_COMMUNITY)
Admission: RE | Admit: 2024-08-05 | Discharge: 2024-08-05 | Disposition: A | Discharge status: Home / Self Care | Source: Ambulatory Visit | Attending: Cardiology | Admitting: Cardiology

## 2024-08-05 DIAGNOSIS — R079 Chest pain, unspecified: Secondary | ICD-10-CM | POA: Diagnosis present

## 2024-08-05 DIAGNOSIS — R0602 Shortness of breath: Secondary | ICD-10-CM | POA: Insufficient documentation

## 2024-08-05 LAB — MYOCARDIAL PERFUSION IMAGING
LV dias vol: 230 mL (ref 62–150)
LV sys vol: 124 mL (ref 4.2–5.8)
Nuc Stress EF: 49 %
Peak HR: 76 {beats}/min
Rest HR: 72 {beats}/min
Rest Nuclear Isotope Dose: 10.8 mCi
SDS: 0
SRS: 2
SSS: 0
ST Depression (mm): 0 mm
Stress Nuclear Isotope Dose: 31.8 mCi
TID: 1.05

## 2024-08-05 MED ORDER — TECHNETIUM TC 99M TETROFOSMIN IV KIT
31.8000 | PACK | Freq: Once | INTRAVENOUS | Status: AC | PRN
  Administered 2024-08-05: 31.8 via INTRAVENOUS

## 2024-08-05 MED ORDER — REGADENOSON 0.4 MG/5ML IV SOLN
0.4000 mg | Freq: Once | INTRAVENOUS | Status: AC
  Administered 2024-08-05: 0.4 mg via INTRAVENOUS

## 2024-08-05 MED ORDER — TECHNETIUM TC 99M TETROFOSMIN IV KIT
10.8000 | PACK | Freq: Once | INTRAVENOUS | Status: AC | PRN
  Administered 2024-08-05: 10.8 via INTRAVENOUS

## 2024-08-05 MED ORDER — REGADENOSON 0.4 MG/5ML IV SOLN
INTRAVENOUS | Status: AC
  Filled 2024-08-05: qty 5

## 2024-08-06 ENCOUNTER — Ambulatory Visit: Payer: Self-pay | Admitting: Cardiology

## 2024-08-11 ENCOUNTER — Ambulatory Visit: Admitting: Internal Medicine

## 2024-08-16 ENCOUNTER — Ambulatory Visit: Admitting: Emergency Medicine

## 2024-08-16 NOTE — Progress Notes (Deleted)
 Cardiology Office Note:    Date:  08/16/2024  ID:  Albert Eaton, DOB Mar 08, 1966, MRN [id number] PCP: Albert Lynwood HERO, NP  [facility] Providers Cardiologist:  Albert Fell, MD Electrophysiologist:  Albert ONEIDA HOLTS, MD { Click to update primary MD,subspecialty MD or APP then REFRESH:1}    {Click to Open Review  :1}   Patient Profile:       Chief Complaint: *** History of Present Illness:  Albert Eaton is a 58 y.o. male with visit-pertinent history noted below  PMH Coronary artery disease, nonobstructive  LHC 09/11/2022: LAD proximal 50, mid 50; OM2 30; RCA proximal 40 HFmrEF (heart failure with mildly reduced ejection fraction) : Initial EF berry MBR was 45 to 50%. ECHO: EF 50 to 55%.  Low normal function.  No RWMA.  Mildly dilated LV with mild concentric LVH.  Unable to assess diastolic parameters.  Mildly elevated PAP.  Severe LA dilation.  Mild MR with prosthetic angioplasty ring in place.  No MS.  Mild to moderate TR.  Mild AI.  Normal RAP.  (03/03/2024) Paroxysmal atrial fibrillation  No longer on amiodarone ,  Currently on Toprol  tartrate 25 mg twice daily and apixaban  5 mg twice daily Mitral regurgitation S/p MV Repair (min inv) 07/2022 ([name]) Echo 03/03/2024: Prosthetic annuloplasty ring present.  No MS with mild MR. Tricuspid regurgitation Hx of PEA arrest  Occurred during dialysis 08/2022 - ?initial rhythm SVT vs AFib Monitor 09/2022: NSR, A-fib burden <1% (controlled rate and RVR), no high-grade AV block, few short ventricular runs  Renal CA s/p L nephrectomy  ESRD on hemodialysis (MWF) - 2018 Hypertension -> on amlodipine  2.5 mg daily along with metoprolol  tartrate 25 mg daily Hyperlipidemia on rosuvastatin  10 mg daily Aortic atherosclerosis Hypothyroidism on Synthroid  25 mcg daily OSA  HIV Hep B Pre-Mitral Valve Repair Dopplers 08/19/2022: Bilateral ICA with minimal wall thickening   He was evaluated by Albert Eaton from EP on 06/11/2023.  Noted  that there was progression from atrial tachycardia to clearly atrial flutter.  Anticoagulation was recommended.  After initially being on Eliquis  he was switched to warfarin because of concerns for ability to reverse anticoagulation and consideration of possible urgent renal transplant operation.  He was seen by Albert Eaton on 10/29/2023 and doing well from a cardiac perspective.  He was apparently listed on the kidney transplant list at [facility] and [facility].  Both they have recommended coming off Eliquis  and transitioning to warfarin.  He went to [facility] ER on 06/08/2024 with chest pain over the past several weeks.  Mostly at night when lies down keeping him from sleeping.  Symptoms are worse when he was bending over.  BNP was roughly 1900 and troponins were elevated but flat at 78 and 79.  EKG was sinus rhythm with PACs.  Not felt to be pericarditis.  On warfarin for A-fib but borderline therapeutic therefore less likely to have PE.  Chest pain felt to be potentially chest wall pain.    He followed up with cardiology service with Albert Eaton on 06/15/2024.  He reported to feel shortness of breath particularly when lying down.  He has to sit up to sleep and experiences breathlessness when bending down or raising his arms above his head.  Noted the symptoms have worsened over the past several weeks.  He uses an inspire device for sleep apnea which is still being adjusted optimal settings.  There was worried for potential progression of his CAD.  He was started on  Imdur  30 mg daily for potential angina.  Stress PET was ordered however he was able to complete due to difficulty with lying flat.  He underwent Lexiscan  Myoview  on 08/05/2024 which findings were consistent with no ischemia and no infarction.  The study was low risk.  Nuclear stress EF 49%.  Discussed the use of AI scribe software for clinical note transcription with the patient, who gave verbal consent to proceed.  History of Present  Illness     Review of systems:  Please see the history of present illness. All other systems are reviewed and otherwise negative. ***      Studies Reviewed:        ***  Risk Assessment/Calculations:   {Does this patient have ATRIAL FIBRILLATION?:[id number]} No BP recorded.  {Refresh Note OR Click here to enter BP  :1}***        Physical Exam:   VS:  There were no vitals taken for this visit.   Wt Readings from Last 3 Encounters:  08/05/24 205 lb (93 kg)  06/19/24 205 lb (93 kg)  06/15/24 203 lb 9.6 oz (92.4 kg)    GEN: Well nourished, well developed in no acute distress NECK: No JVD; No carotid bruits CARDIAC: ***RRR, no murmurs, rubs, gallops RESPIRATORY:  Clear to auscultation without rales, wheezing or rhonchi  ABDOMEN: Soft, non-tender, non-distended EXTREMITIES:  No edema; No acute deformity ***      Assessment and Plan:    Assessment and Plan Assessment & Plan      {Are you ordering a CV Procedure (e.g. stress test, cath, DCCV, TEE, etc)?   Press F2        :[id number]}  Dispo:  No follow-ups on file.  Signed, Albert LITTIE Louis, NP

## 2024-08-22 ENCOUNTER — Encounter: Payer: Self-pay | Admitting: Internal Medicine

## 2024-08-22 ENCOUNTER — Other Ambulatory Visit: Payer: Self-pay

## 2024-08-22 ENCOUNTER — Ambulatory Visit (INDEPENDENT_AMBULATORY_CARE_PROVIDER_SITE_OTHER): Admitting: Internal Medicine

## 2024-08-22 ENCOUNTER — Other Ambulatory Visit (HOSPITAL_COMMUNITY)
Admission: RE | Admit: 2024-08-22 | Discharge: 2024-08-22 | Disposition: A | Discharge status: Home / Self Care | Source: Ambulatory Visit | Attending: Internal Medicine | Admitting: Internal Medicine

## 2024-08-22 DIAGNOSIS — I340 Nonrheumatic mitral (valve) insufficiency: Secondary | ICD-10-CM | POA: Diagnosis not present

## 2024-08-22 DIAGNOSIS — B181 Chronic viral hepatitis B without delta-agent: Secondary | ICD-10-CM | POA: Diagnosis not present

## 2024-08-22 DIAGNOSIS — B20 Human immunodeficiency virus [HIV] disease: Secondary | ICD-10-CM | POA: Diagnosis present

## 2024-08-22 DIAGNOSIS — Z79899 Other long term (current) drug therapy: Secondary | ICD-10-CM | POA: Diagnosis not present

## 2024-08-22 DIAGNOSIS — Z940 Kidney transplant status: Secondary | ICD-10-CM

## 2024-08-22 MED ORDER — BIKTARVY 50-200-25 MG PO TABS: 1.0000 | ORAL_TABLET | Freq: Every day | ORAL | 11 refills | Status: AC

## 2024-08-22 NOTE — Progress Notes (Signed)
 [facility]     HPI: Albert Eaton is a 58 y.o. male presents for HIV management. Renal transpalnt on 07/07/24. Biktarvy , tolerating.  Followed by [facility] and transplant.  Not sexually acitve x 7 years Past Medical History:  Diagnosis Date   Anemia    Aortic atherosclerosis    Arthritis    Ascending aorta dilation 02/17/2023   Limited TTE 02/17/23: no effusion, s/p MV repair w trivial residual MR, asc aorta 39 mm, mod TR, severe LAE   Asthma    Cancer (HCC)    renal cyst   Chronic diastolic CHF (congestive heart failure) (HCC) 01/06/2017   Echo 7/16 [facility] in [place], [place]) Mild AI, mild LAE, mild concentric LVH, EF 55, normal wall motion, mild to moderate MR, mild PI, RVSP 55 // Echo 10/09/16 ([facility]):  Moderate LVH, grade 2 diastolic dysfunction, mild MR, moderate LAE    Dysrhythmia    paroxysmal afib/flutter   Esophagitis    ESRD (end stage renal disease) on dialysis [facility])    Dialysis Mon Wed Fri   GERD (gastroesophageal reflux disease)    Gout    no current problems   Heart murmur    had it since he was a baby- pt had echo 10/01/23   Hepatitis    Hep B   HIV (human immunodeficiency virus infection) (HCC)    HLD (hyperlipidemia)    Hypertension    Hypothyroidism    Mitral regurgitation    Myocardial infarction (HCC)    Neuropathy    Sleep apnea    does not use c-pap   Stroke [facility])    Wears glasses    Wears partial dentures     Past Surgical History:  Procedure Laterality Date   AV FISTULA PLACEMENT Left 07/02/2018   Procedure: Creation of Left arm BRACHIOBASILIC ARTERIOVENOUS  FISTULA;  Surgeon: Gretta Lonni PARAS, MD;  Location: [facility] OR;  Service: Vascular;  Laterality: Left;   BASCILIC VEIN TRANSPOSITION Left 08/27/2018   Procedure: Left arm BRACHIOBASILIC VEIN TRANSPOSITION SECOND STAGE;  Surgeon: Gretta Lonni PARAS, MD;  Location: [facility] OR;  Service: Vascular;  Laterality: Left;   BUBBLE STUDY  09/03/2020    Procedure: BUBBLE STUDY;  Surgeon: Okey Vina GAILS, MD;  Location: [facility] ENDOSCOPY;  Service: Cardiovascular;;   BUBBLE STUDY  06/03/2022   Procedure: BUBBLE STUDY;  Surgeon: Loni Soyla LABOR, MD;  Location: [facility] ENDOSCOPY;  Service: Cardiology;;   CAPD INSERTION N/A 05/30/2021   Procedure: LAPAROSCOPIC INSERTION CONTINUOUS AMBULATORY PERITONEAL DIALYSIS  (CAPD) CATHETER;  Surgeon: Magda Debby SAILOR, MD;  Location: [facility] OR;  Service: Vascular;  Laterality: N/A;   CAPD REMOVAL N/A 02/13/2022   Procedure: PERITONEAL DIALYSIS CATHETER REMOVAL;  Surgeon: Magda Debby SAILOR, MD;  Location: [facility] OR;  Service: Vascular;  Laterality: N/A;   CHOLECYSTECTOMY     COLONOSCOPY W/ BIOPSIES AND POLYPECTOMY     DRUG INDUCED ENDOSCOPY Bilateral 07/06/2023   Procedure: DRUG INDUCED SLEEP ENDOSCOPY;  Surgeon: Carlie Clark, MD;  Location: [facility] OR;  Service: ENT;  Laterality: Bilateral;   GIVENS CAPSULE STUDY N/A 03/01/2018   Procedure: GIVENS CAPSULE STUDY;  Surgeon: Albertus Gordy HERO, MD;  Location: [facility] ENDOSCOPY;  Service: Gastroenterology;  Laterality: N/A;   HERNIA REPAIR     As baby   IMPLANTATION OF HYPOGLOSSAL NERVE STIMULATOR Right 01/14/2024   Procedure: RIGHT IMPLANTATION OF HYPOGLOSSAL NERVE STIMULATOR;  Surgeon: Carlie Clark, MD;  Location: [facility] OR;  Service: ENT;  Laterality:  Right;   INSERTION OF DIALYSIS CATHETER Right 01/20/2024   Procedure: INSERTION OF DIALYSIS CATHETER USING 19cm PALINDROME CHRONIC CATHETER INTO THE RIGHT INTERNAL JUGULAR;  Surgeon: Gretta Lonni PARAS, MD;  Location: [facility] OR;  Service: Vascular;  Laterality: Right;   LEFT HEART CATH AND CORONARY ANGIOGRAPHY N/A 09/11/2022   Procedure: LEFT HEART CATH AND CORONARY ANGIOGRAPHY;  Surgeon: Dann Candyce RAMAN, MD;  Location: [facility] INVASIVE CV LAB;  Service: Cardiovascular;  Laterality: N/A;   MITRAL VALVE REPAIR N/A 08/21/2022   Procedure: MINIMALLY INVASIVE MITRAL VALVE REPAIR USING RING ANNULOPLASTY SIMULUS 30mm;  Surgeon: Maryjane Mt, MD;  Location: [facility] OR;   Service: Open Heart Surgery;  Laterality: N/A;   MULTIPLE TOOTH EXTRACTIONS     NEPHRECTOMY Left 02/18/2021   NOSE SURGERY     RENAL BIOPSY     REVISON OF ARTERIOVENOUS FISTULA Left 01/20/2024   Procedure: REVISON OF ARTERIOVENOUS FISTULA WITH ARTERIAL PLICATION;  Surgeon: Gretta Lonni PARAS, MD;  Location: [facility] OR;  Service: Vascular;  Laterality: Left;   RIGHT/LEFT HEART CATH AND CORONARY ANGIOGRAPHY N/A 09/04/2021   Procedure: RIGHT/LEFT HEART CATH AND CORONARY ANGIOGRAPHY;  Surgeon: Verlin Lonni BIRCH, MD;  Location: [facility] INVASIVE CV LAB;  Service: Cardiovascular;  Laterality: N/A;   RIGHT/LEFT HEART CATH AND CORONARY ANGIOGRAPHY N/A 08/01/2022   Procedure: RIGHT/LEFT HEART CATH AND CORONARY ANGIOGRAPHY;  Surgeon: Wonda Sharper, MD;  Location: [facility] INVASIVE CV LAB;  Service: Cardiovascular;  Laterality: N/A;   TEE WITHOUT CARDIOVERSION N/A 09/03/2020   Procedure: TRANSESOPHAGEAL ECHOCARDIOGRAM (TEE);  Surgeon: Okey Vina GAILS, MD;  Location: [facility] ENDOSCOPY;  Service: Cardiovascular;  Laterality: N/A;   TEE WITHOUT CARDIOVERSION N/A 06/03/2022   Procedure: TRANSESOPHAGEAL ECHOCARDIOGRAM (TEE);  Surgeon: Loni Soyla LABOR, MD;  Location: [facility] ENDOSCOPY;  Service: Cardiology;  Laterality: N/A;   TEE WITHOUT CARDIOVERSION N/A 08/21/2022   Procedure: TRANSESOPHAGEAL ECHOCARDIOGRAM (TEE);  Surgeon: Maryjane Mt, MD;  Location: [facility] OR;  Service: Open Heart Surgery;  Laterality: N/A;   UPPER GI ENDOSCOPY  04/05/2021    Family History  Problem Relation Age of Onset   Hypertension Mother    Heart failure Mother    Hypertension Father    Alcohol abuse Father    Hypertension Sister    Multiple sclerosis Sister    Hypertension Sister    Hypertension Brother    Heart attack Maternal Grandmother 72   Scoliosis Other    Allergic rhinitis Neg Hx    Angioedema Neg Hx    Asthma Neg Hx    Eczema Neg Hx    Immunodeficiency Neg Hx    Urticaria Neg Hx    Colon cancer Neg Hx    Pancreatic cancer Neg Hx     Esophageal cancer Neg Hx    Current Outpatient Medications on File Prior to Visit  Medication Sig Dispense Refill   allopurinol  (ZYLOPRIM ) 100 MG tablet TAKE ONE TABLET BY MOUTH EVERY DAY 90 tablet 0   aspirin  EC 81 MG tablet Take 81 mg by mouth daily. Swallow whole.     azelastine  (ASTELIN ) 0.1 % nasal spray place TWO SPRAY IN EACH NOSTRIL TWICE DAILY 30 mL 0   B Complex-C-Zn-Folic Acid  (DIALYVITE  800/ZINC  PO) Take 1 tablet by mouth daily.     bictegravir-emtricitabine -tenofovir  AF (BIKTARVY ) 50-200-25 MG TABS tablet Take 1 tablet by mouth daily. 30 tablet 11   cetirizine  (ZYRTEC ) 10 MG tablet TAKE ONE TABLET BY MOUTH EVERY DAY AT 9.00 IN THE MORNING 90 tablet 1   furosemide  (LASIX ) 40 MG tablet Take  40 mg by mouth.     ipratropium (ATROVENT ) 0.06 % nasal spray instill TWO SPRAYS IN EACH NOSTRIL 2-3 times DAILY AS NEEDED 60 mL 2   ipratropium-albuterol  (DUONEB) 0.5-2.5 (3) MG/3ML SOLN Take 3 mLs by nebulization every 6 (six) hours as needed (Shortness of breath, cough, wheeze or chest tightness). 75 mL 1   isosorbide  mononitrate (IMDUR ) 30 MG 24 hr tablet Take 1 tablet (30 mg total) by mouth at bedtime. 90 tablet 3   levothyroxine  (SYNTHROID ) 25 MCG tablet Take 1 tablet (25 mcg total) by mouth daily before breakfast. 90 tablet 1   melatonin 3 MG TABS tablet Take 3 mg by mouth at bedtime.     methocarbamol  (ROBAXIN ) 500 MG tablet Take 1 tablet by mouth.     metoprolol  tartrate (LOPRESSOR ) 25 MG tablet TAKE ONE TABLET BY MOUTH TWICE DAILY 180 tablet 3   mycophenolate (MYFORTIC) 180 MG EC tablet Take 2 tablets by mouth.     potassium chloride  SA (KLOR-CON  M) 20 MEQ tablet Take 20 mEq by mouth.     predniSONE  (DELTASONE ) 5 MG tablet Take 5 mg by mouth.     rOPINIRole  (REQUIP ) 1 MG tablet Take 1 tablet (1 mg total) by mouth at bedtime. 90 tablet 2   rosuvastatin  (CRESTOR ) 10 MG tablet Take 1 tablet (10 mg total) by mouth daily. 90 tablet 1   Spacer/Aero-Holding Chambers DEVI 1 Device by Does  not apply route 2 (two) times a day. 1 each 1   sulfamethoxazole-trimethoprim (BACTRIM DS) 800-160 MG tablet Take 1 tablet by mouth.     SYMBICORT  160-4.5 MCG/ACT inhaler Inhale 2 puffs into the lungs in the morning and at bedtime. 10.2 g 5   tacrolimus ER (ENVARSUS XR) 4 MG TB24 Take 4 mg by mouth.     amLODipine  (NORVASC ) 2.5 MG tablet Take 1 tablet (2.5 mg total) by mouth daily at 8 pm. (Patient not taking: Reported on 08/22/2024) 90 tablet 3   cinacalcet  (SENSIPAR ) 90 MG tablet Take 90 mg by mouth daily. (Patient not taking: Reported on 08/22/2024)     diclofenac  Sodium (VOLTAREN ) 1 % GEL Apply 2 g topically 4 (four) times daily. Apply to chest pain when lidocaine  patch not in place (Patient not taking: Reported on 08/22/2024) 150 g 0   Doxercalciferol  (HECTOROL  IV) 8 mcg.     ezetimibe  (ZETIA ) 10 MG tablet Take 10 mg by mouth daily. (Patient not taking: Reported on 08/22/2024)     famotidine  (PEPCID ) 20 MG tablet Take 1 tablet (20 mg total) by mouth 2 (two) times daily. (Patient not taking: Reported on 08/22/2024) 180 tablet 3   ferric citrate  (AURYXIA ) 1 GM 210 MG(Fe) tablet Take 420 mg by mouth See admin instructions. Take 420 mg by mouth three times a day with meals and 210 mg with each snack     fluticasone  (FLOVENT  HFA) 110 MCG/ACT inhaler Inhale 2 puffs into the lungs 2 (two) times daily as needed (for flares).     gabapentin  (NEURONTIN ) 300 MG capsule Take 1 capsule (300 mg total) by mouth daily as needed (pain/neuropathy).     hydrocortisone  (ANUSOL -HC) 25 MG suppository Place 1 suppository (25 mg total) rectally at bedtime. (Patient not taking: Reported on 08/22/2024) 10 suppository 0   iron  sucrose (VENOFER ) 20 MG/ML injection Inject into the vein 3 (three) times a week. (Patient not taking: Reported on 08/22/2024)     iron  sucrose in sodium chloride  0.9 % 100 mL 50 mg. (Patient not taking: Reported on  08/22/2024)     lactulose  (CHRONULAC ) 10 GM/15ML solution Take 45 mLs (30 g total)  by mouth daily as needed. Take 15-30 ml up to twice daily for constipation 1800 mL 11   levalbuterol  (XOPENEX  HFA) 45 MCG/ACT inhaler INHALE TWO puffs into THE lungs EVERY FOUR HOURS AS NEEDED wheezing 15 g 1   [START ON 07/27/2025] LORazepam  (ATIVAN ) 0.5 MG tablet Take 0.5 mg by mouth. (Patient not taking: Reported on 08/22/2024)     lubiprostone  (AMITIZA ) 24 MCG capsule TAKE ONE CAPSULE BY MOUTH TWICE DAILY WITH a meal 180 capsule 0   Methoxy PEG-Epoetin  Beta (MIRCERA IJ) every 30 (thirty) days.     Olopatadine  HCl (PATADAY ) 0.7 % SOLN Place 1 drop into both eyes 2 (two) times daily as needed (allergies). 2.5 mL 5   pantoprazole  (PROTONIX ) 40 MG tablet Take 1 tablet (40 mg total) by mouth daily. 30 tablet 5   sevelamer  carbonate (RENVELA ) 800 MG tablet Take 2,400 mg by mouth daily.     No current facility-administered medications on file prior to visit.    Allergies  Allergen Reactions   Ace Inhibitors Cough and Other (See Comments)      Lab Results HIV 1 RNA Quant  Date Value  01/21/2024 NOT DETECTED copies/mL  07/09/2023 <20 Copies/mL (H)  04/17/2022 Not Detected Copies/mL   HIV-1 RNA Viral Load (copies/mL)  Date Value  11/13/2022 <20   CD4 T Cell Abs (/uL)  Date Value  07/09/2023 860  12/02/2022 560  09/08/2022 694   No results found for: [id number] Lab Results  Component Value Date   WBC 5.3 08/03/2024   HGB 9.3 (L) 08/03/2024   HCT 28.4 (L) 08/03/2024   MCV 98.3 08/03/2024   PLT 367 08/03/2024    Lab Results  Component Value Date   CREATININE 4.59 (H) 08/03/2024   BUN 41 (H) 08/03/2024   NA 136 08/03/2024   K 3.4 (L) 08/03/2024   CL 107 08/03/2024   CO2 15 (L) 08/03/2024   Lab Results  Component Value Date   ALT 27 08/03/2024   AST 21 08/03/2024   ALKPHOS 134 (H) 08/03/2024   BILITOT 0.6 08/03/2024    Lab Results  Component Value Date   CHOL 188 08/07/2023   TRIG 169 (H) 08/07/2023   HDL 36 (L) 08/07/2023   LDLCALC 122 (H) 08/07/2023   Lab  Results  Component Value Date   HAV REACTIVE (A) 10/15/2016   Lab Results  Component Value Date   HEPBSAG REACTIVE (A) 01/21/2024   HEPBSAB NON REACTIVE 12/02/2021   Lab Results  Component Value Date   HCVAB NEGATIVE 10/15/2016   Lab Results  Component Value Date   CHLAMYDIAWP Negative 06/27/2021   N Negative 06/27/2021   Lab Results  Component Value Date   GCPROBEAPT NOT DETECTED 12/25/2016   Lab Results  Component Value Date   QUANTGOLD NEGATIVE 10/15/2016    Assessment/Plan #HIV  VL nd , on 08/15/24 Continue biktarvy  -F/u in 3 months  #chronic HBV -biktarvyas above HBV dna ND on 08/15/24  #sever MR SP MV repait  #SP kidney transplant on 07/07/24 for Other, Specify - [facility] - Immun[facility]ression: tacrolimus, MMF, prednisone  . On bactrim and vlagancylovir for ppx -no longer HD   #Vaccination COVID Flu- 10/25 Monkeypox PCV 02/04/24 Meningitis utd HepA immune HEpB chronic Tdap 01/27/2017 Shingles  #Health maintenance -Quantiferon -RPR -tday -HCV 08/15/24 -GC today -Lipid 09/30/24, on risuvastatin. -Dysplasia screen M -Colonoscopy- next year  Loney Stank, MD [facility]   I have personally spent 35 minutes involved in face-to-face and non-face-to-face activities for this patient on the day of the visit. Professional time spent includes the following activities: Preparing to see the patient (review of tests), Obtaining and/or reviewing separately obtained history (admission/discharge record), Performing a medically appropriate examination and/or evaluation , Ordering medications/tests/procedures, referring and communicating with other health care professionals, Documenting clinical information in the EMR, Independently interpreting results (not separately reported), Communicating results to the patient/family/caregiver, Counseling and educating the  patient/family/caregiver and Care coordination (not separately reported).

## 2024-08-23 LAB — COMPLETE METABOLIC PANEL WITHOUT GFR
AG Ratio: 1.8 (calc) (ref 1.0–2.5)
ALT: 31 U/L (ref 9–46)
AST: 23 U/L (ref 10–35)
Albumin: 4.5 g/dL (ref 3.6–5.1)
Alkaline phosphatase (APISO): 130 U/L (ref 35–144)
BUN/Creatinine Ratio: 10 (calc) (ref 6–22)
BUN: 32 mg/dL — ABNORMAL HIGH (ref 7–25)
CO2: 20 mmol/L (ref 20–32)
Calcium: 9.7 mg/dL (ref 8.6–10.3)
Chloride: 108 mmol/L (ref 98–110)
Creat: 3.23 mg/dL — ABNORMAL HIGH (ref 0.70–1.30)
Globulin: 2.5 g/dL (ref 1.9–3.7)
Glucose, Bld: 103 mg/dL — ABNORMAL HIGH (ref 65–99)
Potassium: 4.3 mmol/L (ref 3.5–5.3)
Sodium: 140 mmol/L (ref 135–146)
Total Bilirubin: 0.7 mg/dL (ref 0.2–1.2)
Total Protein: 7 g/dL (ref 6.1–8.1)

## 2024-08-23 LAB — LIPID PANEL
Cholesterol: 103 mg/dL (ref <200)
HDL: 45 mg/dL (ref >=40)
LDL Cholesterol (Calc): 44 mg/dL
Non-HDL Cholesterol (Calc): 58 mg/dL (ref <130)
Total CHOL/HDL Ratio: 2.3 (calc) (ref <5.0)
Triglycerides: 64 mg/dL (ref <150)

## 2024-08-23 LAB — RPR: RPR Ser Ql: NONREACTIVE

## 2024-08-23 LAB — URINE CYTOLOGY ANCILLARY ONLY
Chlamydia: NEGATIVE
Comment: NEGATIVE
Comment: NORMAL
Neisseria Gonorrhea: NEGATIVE

## 2024-08-23 LAB — T-HELPER CELLS (CD4) COUNT (NOT AT ARMC)
CD4 % Helper T Cell: 31 % — ABNORMAL LOW (ref 33–65)
CD4 T Cell Abs: 310 /uL — ABNORMAL LOW (ref 400–1790)

## 2024-08-29 ENCOUNTER — Encounter: Payer: Self-pay | Admitting: *Deleted

## 2024-08-30 ENCOUNTER — Other Ambulatory Visit: Payer: Self-pay | Admitting: Nurse Practitioner

## 2024-08-31 ENCOUNTER — Ambulatory Visit: Payer: Self-pay | Admitting: Pulmonary Disease

## 2024-08-31 ENCOUNTER — Encounter: Payer: Self-pay | Admitting: *Deleted

## 2024-08-31 ENCOUNTER — Emergency Department (HOSPITAL_COMMUNITY)

## 2024-08-31 ENCOUNTER — Other Ambulatory Visit: Payer: Self-pay

## 2024-08-31 ENCOUNTER — Inpatient Hospital Stay (HOSPITAL_COMMUNITY)
Admission: EM | Admit: 2024-08-31 | Discharge: 2024-09-09 | DRG: 252 | Disposition: A | Discharge status: Home / Self Care | Attending: Internal Medicine | Admitting: Internal Medicine

## 2024-08-31 ENCOUNTER — Encounter (HOSPITAL_COMMUNITY): Payer: Self-pay

## 2024-08-31 DIAGNOSIS — Z7901 Long term (current) use of anticoagulants: Secondary | ICD-10-CM

## 2024-08-31 DIAGNOSIS — I2489 Other forms of acute ischemic heart disease: Secondary | ICD-10-CM | POA: Diagnosis present

## 2024-08-31 DIAGNOSIS — Z604 Social exclusion and rejection: Secondary | ICD-10-CM | POA: Diagnosis present

## 2024-08-31 DIAGNOSIS — I5033 Acute on chronic diastolic (congestive) heart failure: Secondary | ICD-10-CM | POA: Diagnosis present

## 2024-08-31 DIAGNOSIS — I5023 Acute on chronic systolic (congestive) heart failure: Secondary | ICD-10-CM

## 2024-08-31 DIAGNOSIS — I480 Paroxysmal atrial fibrillation: Secondary | ICD-10-CM | POA: Diagnosis present

## 2024-08-31 DIAGNOSIS — I081 Rheumatic disorders of both mitral and tricuspid valves: Secondary | ICD-10-CM | POA: Diagnosis present

## 2024-08-31 DIAGNOSIS — Z79890 Hormone replacement therapy (postmenopausal): Secondary | ICD-10-CM

## 2024-08-31 DIAGNOSIS — Z9049 Acquired absence of other specified parts of digestive tract: Secondary | ICD-10-CM

## 2024-08-31 DIAGNOSIS — K219 Gastro-esophageal reflux disease without esophagitis: Secondary | ICD-10-CM | POA: Diagnosis present

## 2024-08-31 DIAGNOSIS — D84821 Immunodeficiency due to drugs: Secondary | ICD-10-CM | POA: Diagnosis present

## 2024-08-31 DIAGNOSIS — E785 Hyperlipidemia, unspecified: Secondary | ICD-10-CM | POA: Diagnosis present

## 2024-08-31 DIAGNOSIS — M109 Gout, unspecified: Secondary | ICD-10-CM | POA: Diagnosis present

## 2024-08-31 DIAGNOSIS — N189 Chronic kidney disease, unspecified: Secondary | ICD-10-CM | POA: Diagnosis present

## 2024-08-31 DIAGNOSIS — G4733 Obstructive sleep apnea (adult) (pediatric): Secondary | ICD-10-CM | POA: Diagnosis present

## 2024-08-31 DIAGNOSIS — I77810 Thoracic aortic ectasia: Secondary | ICD-10-CM | POA: Diagnosis present

## 2024-08-31 DIAGNOSIS — N2581 Secondary hyperparathyroidism of renal origin: Secondary | ICD-10-CM | POA: Diagnosis present

## 2024-08-31 DIAGNOSIS — I700 Atherosclerosis of aorta: Secondary | ICD-10-CM | POA: Diagnosis present

## 2024-08-31 DIAGNOSIS — I130 Hypertensive heart and chronic kidney disease with heart failure and stage 1 through stage 4 chronic kidney disease, or unspecified chronic kidney disease: Secondary | ICD-10-CM | POA: Diagnosis not present

## 2024-08-31 DIAGNOSIS — T8619 Other complication of kidney transplant: Secondary | ICD-10-CM | POA: Diagnosis present

## 2024-08-31 DIAGNOSIS — Y830 Surgical operation with transplant of whole organ as the cause of abnormal reaction of the patient, or of later complication, without mention of misadventure at the time of the procedure: Secondary | ICD-10-CM | POA: Diagnosis present

## 2024-08-31 DIAGNOSIS — R0603 Acute respiratory distress: Secondary | ICD-10-CM | POA: Diagnosis present

## 2024-08-31 DIAGNOSIS — N184 Chronic kidney disease, stage 4 (severe): Secondary | ICD-10-CM | POA: Diagnosis present

## 2024-08-31 DIAGNOSIS — I4892 Unspecified atrial flutter: Secondary | ICD-10-CM | POA: Diagnosis present

## 2024-08-31 DIAGNOSIS — Z7952 Long term (current) use of systemic steroids: Secondary | ICD-10-CM

## 2024-08-31 DIAGNOSIS — D631 Anemia in chronic kidney disease: Secondary | ICD-10-CM | POA: Diagnosis present

## 2024-08-31 DIAGNOSIS — I252 Old myocardial infarction: Secondary | ICD-10-CM

## 2024-08-31 DIAGNOSIS — E875 Hyperkalemia: Secondary | ICD-10-CM | POA: Diagnosis present

## 2024-08-31 DIAGNOSIS — E876 Hypokalemia: Secondary | ICD-10-CM | POA: Diagnosis present

## 2024-08-31 DIAGNOSIS — N186 End stage renal disease: Secondary | ICD-10-CM | POA: Diagnosis present

## 2024-08-31 DIAGNOSIS — Z85528 Personal history of other malignant neoplasm of kidney: Secondary | ICD-10-CM

## 2024-08-31 DIAGNOSIS — Z952 Presence of prosthetic heart valve: Secondary | ICD-10-CM

## 2024-08-31 DIAGNOSIS — I509 Heart failure, unspecified: Principal | ICD-10-CM

## 2024-08-31 DIAGNOSIS — Z8249 Family history of ischemic heart disease and other diseases of the circulatory system: Secondary | ICD-10-CM

## 2024-08-31 DIAGNOSIS — Z8673 Personal history of transient ischemic attack (TIA), and cerebral infarction without residual deficits: Secondary | ICD-10-CM

## 2024-08-31 DIAGNOSIS — E1122 Type 2 diabetes mellitus with diabetic chronic kidney disease: Secondary | ICD-10-CM | POA: Diagnosis present

## 2024-08-31 DIAGNOSIS — Z6829 Body mass index (BMI) 29.0-29.9, adult: Secondary | ICD-10-CM

## 2024-08-31 DIAGNOSIS — Z7982 Long term (current) use of aspirin: Secondary | ICD-10-CM

## 2024-08-31 DIAGNOSIS — Z59868 Other specified financial insecurity: Secondary | ICD-10-CM

## 2024-08-31 DIAGNOSIS — Z7951 Long term (current) use of inhaled steroids: Secondary | ICD-10-CM

## 2024-08-31 DIAGNOSIS — I2510 Atherosclerotic heart disease of native coronary artery without angina pectoris: Secondary | ICD-10-CM | POA: Diagnosis present

## 2024-08-31 DIAGNOSIS — I5083 High output heart failure: Secondary | ICD-10-CM | POA: Diagnosis present

## 2024-08-31 DIAGNOSIS — E039 Hypothyroidism, unspecified: Secondary | ICD-10-CM | POA: Diagnosis present

## 2024-08-31 DIAGNOSIS — Z79899 Other long term (current) drug therapy: Secondary | ICD-10-CM

## 2024-08-31 DIAGNOSIS — I2721 Secondary pulmonary arterial hypertension: Secondary | ICD-10-CM | POA: Diagnosis present

## 2024-08-31 DIAGNOSIS — B20 Human immunodeficiency virus [HIV] disease: Secondary | ICD-10-CM | POA: Diagnosis present

## 2024-08-31 DIAGNOSIS — T8611 Kidney transplant rejection: Secondary | ICD-10-CM | POA: Diagnosis present

## 2024-08-31 DIAGNOSIS — E669 Obesity, unspecified: Secondary | ICD-10-CM | POA: Diagnosis present

## 2024-08-31 DIAGNOSIS — Z87891 Personal history of nicotine dependence: Secondary | ICD-10-CM

## 2024-08-31 DIAGNOSIS — Z888 Allergy status to other drugs, medicaments and biological substances status: Secondary | ICD-10-CM

## 2024-08-31 DIAGNOSIS — Z79621 Long term (current) use of calcineurin inhibitor: Secondary | ICD-10-CM

## 2024-08-31 HISTORY — DX: Nausea with vomiting, unspecified: R11.2

## 2024-08-31 LAB — CBC WITH DIFFERENTIAL/PLATELET
Abs Immature Granulocytes: 0.18 K/uL — ABNORMAL HIGH (ref 0.00–0.07)
Basophils Absolute: 0 K/uL (ref 0.0–0.1)
Basophils Relative: 0 %
Eosinophils Absolute: 0 K/uL (ref 0.0–0.5)
Eosinophils Relative: 1 %
HCT: 32.4 % — ABNORMAL LOW (ref 39.0–52.0)
Hemoglobin: 10.4 g/dL — ABNORMAL LOW (ref 13.0–17.0)
Immature Granulocytes: 3 %
Lymphocytes Relative: 9 %
Lymphs Abs: 0.6 K/uL — ABNORMAL LOW (ref 0.7–4.0)
MCH: 31.8 pg (ref 26.0–34.0)
MCHC: 32.1 g/dL (ref 30.0–36.0)
MCV: 99.1 fL (ref 80.0–100.0)
Monocytes Absolute: 0.6 K/uL (ref 0.1–1.0)
Monocytes Relative: 9 %
Neutro Abs: 5.6 K/uL (ref 1.7–7.7)
Neutrophils Relative %: 78 %
Platelets: 205 K/uL (ref 150–400)
RBC: 3.27 MIL/uL — ABNORMAL LOW (ref 4.22–5.81)
RDW: 18.8 % — ABNORMAL HIGH (ref 11.5–15.5)
WBC: 7.1 K/uL (ref 4.0–10.5)
nRBC: 0 % (ref 0.0–0.2)

## 2024-08-31 LAB — URINALYSIS, ROUTINE W REFLEX MICROSCOPIC
Bilirubin Urine: NEGATIVE
Glucose, UA: NEGATIVE mg/dL
Hgb urine dipstick: NEGATIVE
Ketones, ur: NEGATIVE mg/dL
Leukocytes,Ua: NEGATIVE
Nitrite: NEGATIVE
Protein, ur: NEGATIVE mg/dL
Specific Gravity, Urine: 1.006 (ref 1.005–1.030)
pH: 5 (ref 5.0–8.0)

## 2024-08-31 LAB — BRAIN NATRIURETIC PEPTIDE: B Natriuretic Peptide: 1578.9 pg/mL — ABNORMAL HIGH (ref 0.0–100.0)

## 2024-08-31 LAB — BASIC METABOLIC PANEL WITH GFR
Anion gap: 9 (ref 5–15)
BUN: 30 mg/dL — ABNORMAL HIGH (ref 6–20)
CO2: 19 mmol/L — ABNORMAL LOW (ref 22–32)
Calcium: 8.9 mg/dL (ref 8.9–10.3)
Chloride: 109 mmol/L (ref 98–111)
Creatinine, Ser: 2.44 mg/dL — ABNORMAL HIGH (ref 0.61–1.24)
GFR, Estimated: 30 mL/min — ABNORMAL LOW (ref >60)
Glucose, Bld: 122 mg/dL — ABNORMAL HIGH (ref 70–99)
Potassium: 4.3 mmol/L (ref 3.5–5.1)
Sodium: 137 mmol/L (ref 135–145)

## 2024-08-31 LAB — TROPONIN I (HIGH SENSITIVITY): Troponin I (High Sensitivity): 47 ng/L — ABNORMAL HIGH (ref <18)

## 2024-08-31 NOTE — ED Provider Triage Note (Signed)
 Emergency Medicine Provider Triage Evaluation Note  Lothar Prehn Deroy , a 58 y.o. male  was evaluated in triage.  Pt complains of shortness of breath progressively worse x 3 months, seen in Oct for similar complaint. Called pulmonology and told to come to the ED.  Hx of kidney transplant 9/11, heart failure. Taking lasix .  Endorses rhinorrhea, urinary frequency  Denies headache, fever, chest pain, cough, congestion, abdominal pain, n/v/d, LE swelling  Review of Systems  Positive: N/a Negative: N/a  Physical Exam  BP (!) 155/111 (BP Location: Right Arm)   Pulse 79   Temp 98 F (36.7 C) (Oral)   Resp (!) 25   Ht 5' 10 (1.778 m)   Wt 93.6 kg   SpO2 98%   BMI 29.61 kg/m  Gen:   Awake, no distress   Resp:  Normal effort  MSK:   Moves extremities without difficulty  Other:    Medical Decision Making  Medically screening exam initiated at 4:07 PM.  Appropriate orders placed.  Martavius Lusty Taira was informed that the remainder of the evaluation will be completed by another provider, this initial triage assessment does not replace that evaluation, and the importance of remaining in the ED until their evaluation is complete.     Beola Terrall RAMAN, [place] 08/31/24 [phone]

## 2024-08-31 NOTE — ED Triage Notes (Signed)
 Pt to er, pt states that he is here for shortness of breath, states that even standing to wash dishes he had to sit down because he got too short of breath.

## 2024-08-31 NOTE — ED Triage Notes (Signed)
 Shortness of breath for months  hx chf

## 2024-08-31 NOTE — Telephone Encounter (Signed)
 Noted.  Noting further needed.

## 2024-08-31 NOTE — Telephone Encounter (Signed)
 FYI Only or Action Required?: FYI only for provider: ED advised.  Patient is followed in Pulmonology for OSA, lung nodule, mild intermittent asthma, last seen on 05/26/2024 by Parrett, Madelin RAMAN, NP.  Called Nurse Triage reporting Shortness of Breath.  Symptoms began yesterday.  Interventions attempted: Rescue inhaler, Maintenance inhaler, and Other: sitting up in chair .  Symptoms are: rapidly worsening.  Triage Disposition: Go to ED Now (or PCP Triage)  Patient/caregiver understands and will follow disposition?: Yes   FYI Only or Action Required?: FYI only for provider: ED advised.  Patient was last seen in primary care on 06/01/2024 by [name], NP.  Called Nurse Triage reporting Shortness of Breath.  Symptoms began yesterday.  Interventions attempted: Prescription medications: inhalers.  Symptoms are: rapidly worsening.  Triage Disposition: Go to ED Now (or PCP Triage)  Patient/caregiver understands and will follow disposition?: Yes   Patient requesting appt with primary pulmonary provider. Recommended patient go to ED for evaluation . Recommended patient to call 911 and drive self or walk due to worsening sx. Patient declined 911.  Patient did agree to go to ED.     Copied from CRM #[id number]. Topic: Clinical - Red Word Triage >> Aug 31, 2024  2:06 PM Isabell A wrote: [facility] that prompted transfer to Nurse Triage: Trouble breathing - went to ER a couple of times, PCP and cardiologist & allergist as well.    Patient states he can't stand to wash dishes he had to sit down at least four times, has to constantly take breaks while doing activities, having trouble talking and experiencing SOB. Reason for Disposition  Patient sounds very sick or weak to the triager  Answer Assessment - Initial Assessment Questions Recommended ED now and if no one to take patient call 911. Offered to call 911 for patient and patient declined. Patient reports he has had multiple ED visits  and does not want to go but is started to panic with how often SOB he is having. No swelling in legs or feet or hands. Patient calling requesting appt with pulmonary provider.      1. RESPIRATORY STATUS: Describe your breathing? (e.g., wheezing, shortness of breath, unable to speak, severe coughing)      SOB with exertion and worsening laying flat 2. ONSET: When did this breathing problem begin?      Couple of months ago and worse today  3. PATTERN Does the difficult breathing come and go, or has it been constant since it started?      Sit up totally fine . But if lays down loses breath, and starts to panic  4. SEVERITY: How bad is your breathing? (e.g., mild, moderate, severe)      Moderate sitting up  5. RECURRENT SYMPTOM: Have you had difficulty breathing before? If Yes, ask: When was the last time? and What happened that time?      Yes  6. CARDIAC HISTORY: Do you have any history of heart disease? (e.g., heart attack, angina, bypass surgery, angioplasty)      Hx CHF valve replacement  7. LUNG HISTORY: Do you have any history of lung disease?  (e.g., pulmonary embolus, asthma, emphysema)     Hx mild asthma , nodule in lung  8. CAUSE: What do you think is causing the breathing problem?      Not sure 9. OTHER SYMPTOMS: Do you have any other symptoms? (e.g., chest pain, cough, dizziness, fever, runny nose)     SOB/ difficulty breathing with  exertion. Dizziness with laying down denies chest pain now but did have chest pain yesterday.  10. O2 SATURATION MONITOR:  Do you use an oxygen saturation monitor (pulse oximeter) at home? If Yes, ask: What is your reading (oxygen level) today? What is your usual oxygen saturation reading? (e.g., 95%)       Na  11. PREGNANCY: Is there any chance you are pregnant? When was your last menstrual period?       na 12. TRAVEL: Have you traveled out of the country in the last month? (e.g., travel history, exposures)        na  Protocols used: Breathing Difficulty-A-AH

## 2024-09-01 ENCOUNTER — Observation Stay (HOSPITAL_COMMUNITY)

## 2024-09-01 ENCOUNTER — Ambulatory Visit: Attending: Emergency Medicine | Admitting: Emergency Medicine

## 2024-09-01 DIAGNOSIS — I5023 Acute on chronic systolic (congestive) heart failure: Secondary | ICD-10-CM

## 2024-09-01 DIAGNOSIS — I10 Essential (primary) hypertension: Secondary | ICD-10-CM | POA: Diagnosis not present

## 2024-09-01 DIAGNOSIS — Z940 Kidney transplant status: Secondary | ICD-10-CM | POA: Diagnosis not present

## 2024-09-01 DIAGNOSIS — I2510 Atherosclerotic heart disease of native coronary artery without angina pectoris: Secondary | ICD-10-CM | POA: Diagnosis not present

## 2024-09-01 DIAGNOSIS — N186 End stage renal disease: Secondary | ICD-10-CM

## 2024-09-01 DIAGNOSIS — I509 Heart failure, unspecified: Secondary | ICD-10-CM | POA: Diagnosis not present

## 2024-09-01 DIAGNOSIS — I5033 Acute on chronic diastolic (congestive) heart failure: Secondary | ICD-10-CM | POA: Diagnosis not present

## 2024-09-01 DIAGNOSIS — Z21 Asymptomatic human immunodeficiency virus [HIV] infection status: Secondary | ICD-10-CM

## 2024-09-01 DIAGNOSIS — I4892 Unspecified atrial flutter: Secondary | ICD-10-CM

## 2024-09-01 DIAGNOSIS — E785 Hyperlipidemia, unspecified: Secondary | ICD-10-CM

## 2024-09-01 LAB — ECHOCARDIOGRAM COMPLETE
AR max vel: 3.37 cm2
AV Area VTI: 4.02 cm2
AV Area mean vel: 3.19 cm2
AV Mean grad: 4 mmHg
AV Peak grad: 4.8 mmHg
Ao pk vel: 1.1 m/s
Area-P 1/2: 3.37 cm2
Height: 70 in
MV VTI: 2.85 cm2
S' Lateral: 4.25 cm
Weight: 3280 [oz_av]

## 2024-09-01 LAB — PHOSPHORUS: Phosphorus: 1.8 mg/dL — ABNORMAL LOW (ref 2.5–4.6)

## 2024-09-01 LAB — TROPONIN I (HIGH SENSITIVITY): Troponin I (High Sensitivity): 52 ng/L — ABNORMAL HIGH (ref <18)

## 2024-09-01 MED ORDER — SODIUM CHLORIDE 0.9 % IV SOLN: 250.0000 mL | INTRAVENOUS | Status: AC | PRN

## 2024-09-01 MED ORDER — AMLODIPINE BESYLATE 5 MG PO TABS
5.0000 mg | ORAL_TABLET | Freq: Every day | ORAL | Status: DC
  Administered 2024-09-01 – 2024-09-09 (×9): 5 mg via ORAL
  Filled 2024-09-01 (×9): qty 1

## 2024-09-01 MED ORDER — ALLOPURINOL 100 MG PO TABS
100.0000 mg | ORAL_TABLET | Freq: Every day | ORAL | Status: DC
  Administered 2024-09-01 – 2024-09-09 (×9): 100 mg via ORAL
  Filled 2024-09-01 (×9): qty 1

## 2024-09-01 MED ORDER — FLUTICASONE FUROATE-VILANTEROL 100-25 MCG/ACT IN AEPB
1.0000 | INHALATION_SPRAY | Freq: Every day | RESPIRATORY_TRACT | Status: DC
  Administered 2024-09-01 – 2024-09-09 (×8): 1 via RESPIRATORY_TRACT
  Filled 2024-09-01: qty 28

## 2024-09-01 MED ORDER — ASPIRIN 81 MG PO TBEC
81.0000 mg | DELAYED_RELEASE_TABLET | Freq: Every day | ORAL | Status: DC
  Administered 2024-09-01 – 2024-09-03 (×3): 81 mg via ORAL
  Filled 2024-09-01 (×3): qty 1

## 2024-09-01 MED ORDER — METOPROLOL TARTRATE 12.5 MG HALF TABLET
12.5000 mg | ORAL_TABLET | Freq: Two times a day (BID) | ORAL | Status: DC
  Administered 2024-09-01 – 2024-09-09 (×17): 12.5 mg via ORAL
  Filled 2024-09-01 (×17): qty 1

## 2024-09-01 MED ORDER — IPRATROPIUM-ALBUTEROL 0.5-2.5 (3) MG/3ML IN SOLN: 3.0000 mL | Freq: Four times a day (QID) | RESPIRATORY_TRACT | Status: DC | PRN

## 2024-09-01 MED ORDER — ENOXAPARIN SODIUM 40 MG/0.4ML IJ SOSY
40.0000 mg | PREFILLED_SYRINGE | INTRAMUSCULAR | Status: DC
  Administered 2024-09-01 – 2024-09-02 (×2): 40 mg via SUBCUTANEOUS
  Filled 2024-09-01 (×2): qty 0.4

## 2024-09-01 MED ORDER — TACROLIMUS ER 1 MG PO TB24
1.0000 mg | ORAL_TABLET | Freq: Every day | ORAL | Status: DC
  Filled 2024-09-01: qty 1

## 2024-09-01 MED ORDER — ROPINIROLE HCL 1 MG PO TABS
1.0000 mg | ORAL_TABLET | Freq: Every day | ORAL | Status: DC
  Administered 2024-09-01 – 2024-09-08 (×8): 1 mg via ORAL
  Filled 2024-09-01 (×8): qty 1

## 2024-09-01 MED ORDER — MYCOPHENOLATE SODIUM 180 MG PO TBEC
360.0000 mg | DELAYED_RELEASE_TABLET | Freq: Three times a day (TID) | ORAL | Status: DC
  Administered 2024-09-01 – 2024-09-09 (×25): 360 mg via ORAL
  Filled 2024-09-01 (×29): qty 2

## 2024-09-01 MED ORDER — SULFAMETHOXAZOLE-TRIMETHOPRIM 800-160 MG PO TABS
1.0000 | ORAL_TABLET | ORAL | Status: DC
  Administered 2024-09-02 – 2024-09-09 (×4): 1 via ORAL
  Filled 2024-09-01 (×4): qty 1

## 2024-09-01 MED ORDER — BICTEGRAVIR-EMTRICITAB-TENOFOV 50-200-25 MG PO TABS
1.0000 | ORAL_TABLET | Freq: Every day | ORAL | Status: DC
  Administered 2024-09-01 – 2024-09-09 (×9): 1 via ORAL
  Filled 2024-09-01 (×9): qty 1

## 2024-09-01 MED ORDER — FUROSEMIDE 10 MG/ML IJ SOLN
40.0000 mg | Freq: Once | INTRAMUSCULAR | Status: AC
  Administered 2024-09-01: 40 mg via INTRAVENOUS
  Filled 2024-09-01: qty 4

## 2024-09-01 MED ORDER — SODIUM CHLORIDE 0.9% FLUSH: 3.0000 mL | INTRAVENOUS | Status: DC | PRN

## 2024-09-01 MED ORDER — ACETAMINOPHEN 325 MG PO TABS: 650.0000 mg | ORAL_TABLET | Freq: Four times a day (QID) | ORAL | Status: DC | PRN

## 2024-09-01 MED ORDER — FUROSEMIDE 10 MG/ML IJ SOLN
40.0000 mg | Freq: Two times a day (BID) | INTRAMUSCULAR | Status: DC
  Administered 2024-09-01 – 2024-09-05 (×8): 40 mg via INTRAVENOUS
  Filled 2024-09-01 (×8): qty 4

## 2024-09-01 MED ORDER — HYDRALAZINE HCL 20 MG/ML IJ SOLN: 10.0000 mg | INTRAMUSCULAR | Status: DC | PRN

## 2024-09-01 MED ORDER — TACROLIMUS 1 MG PO CAPS: 1.0000 mg | ORAL_CAPSULE | Freq: Every day | ORAL | Status: DC

## 2024-09-01 MED ORDER — ACETAMINOPHEN 650 MG RE SUPP: 650.0000 mg | Freq: Four times a day (QID) | RECTAL | Status: DC | PRN

## 2024-09-01 MED ORDER — ATORVASTATIN CALCIUM 40 MG PO TABS
40.0000 mg | ORAL_TABLET | Freq: Every day | ORAL | Status: DC
  Administered 2024-09-01 – 2024-09-09 (×9): 40 mg via ORAL
  Filled 2024-09-01 (×9): qty 1

## 2024-09-01 MED ORDER — ISOSORBIDE MONONITRATE ER 30 MG PO TB24
30.0000 mg | ORAL_TABLET | Freq: Every day | ORAL | Status: DC
  Administered 2024-09-01 – 2024-09-02 (×2): 30 mg via ORAL
  Filled 2024-09-01 (×2): qty 1

## 2024-09-01 MED ORDER — TACROLIMUS ER 4 MG PO TB24
4.0000 mg | ORAL_TABLET | Freq: Two times a day (BID) | ORAL | Status: DC
  Filled 2024-09-01: qty 1

## 2024-09-01 MED ORDER — TACROLIMUS ER 4 MG PO TB24
8.0000 mg | ORAL_TABLET | Freq: Every day | ORAL | Status: DC
  Administered 2024-09-01 – 2024-09-09 (×9): 8 mg via ORAL
  Filled 2024-09-01 (×9): qty 2

## 2024-09-01 MED ORDER — PANTOPRAZOLE SODIUM 40 MG PO TBEC
40.0000 mg | DELAYED_RELEASE_TABLET | Freq: Every day | ORAL | Status: DC
  Administered 2024-09-02 – 2024-09-09 (×8): 40 mg via ORAL
  Filled 2024-09-01 (×8): qty 1

## 2024-09-01 MED ORDER — PREDNISONE 5 MG PO TABS
5.0000 mg | ORAL_TABLET | Freq: Every day | ORAL | Status: DC
  Administered 2024-09-01 – 2024-09-09 (×9): 5 mg via ORAL
  Filled 2024-09-01 (×10): qty 1

## 2024-09-01 MED ORDER — TACROLIMUS ER 1 MG PO TB24
1.0000 mg | ORAL_TABLET | Freq: Every day | ORAL | Status: DC
  Administered 2024-09-01 – 2024-09-09 (×9): 1 mg via ORAL
  Filled 2024-09-01 (×8): qty 1

## 2024-09-01 MED ORDER — SODIUM CHLORIDE 0.9% FLUSH
3.0000 mL | Freq: Two times a day (BID) | INTRAVENOUS | Status: DC
  Administered 2024-09-01 – 2024-09-09 (×13): 3 mL via INTRAVENOUS

## 2024-09-01 MED ORDER — CINACALCET HCL 30 MG PO TABS
30.0000 mg | ORAL_TABLET | Freq: Every day | ORAL | Status: DC
  Administered 2024-09-01 – 2024-09-08 (×7): 30 mg via ORAL
  Filled 2024-09-01 (×8): qty 1

## 2024-09-01 MED ORDER — CHOLECALCIFEROL 10 MCG (400 UNIT) PO TABS
400.0000 [IU] | ORAL_TABLET | Freq: Every day | ORAL | Status: DC
  Administered 2024-09-01 – 2024-09-03 (×3): 400 [IU] via ORAL
  Filled 2024-09-01 (×3): qty 1

## 2024-09-01 NOTE — Assessment & Plan Note (Addendum)
 2D echo May 2025 with EF of 50 to 55%, mild MR, moderate TR, mild AR, mild aortic stenosis Progressive increased work of breathing, orthopnea as well as dyspnea exertion for multiple months with noted recent renal transplant September 2025 BNP 1600 Chest x-ray with CHF and bilateral edema Status post IV Lasix  in the ER Monitor urine output Strict Is and Os and daily weights Consult cardiology for appropriate diuresis in the setting of ESRD status post renal transplant Monitor

## 2024-09-01 NOTE — ED Provider Notes (Signed)
 [place] EMERGENCY DEPARTMENT AT [facility] Provider Note   CSN: [id number] Arrival date & time: 08/31/24  1505     Patient presents with: Shortness of Breath   Albert Eaton is a 58 y.o. male.   Patient presents to the emergency department for evaluation of shortness of breath.  Patient has a history of renal disease, was on dialysis for 6 years.  Patient reports that he received a kidney in September of this year but continues to have progressively worsening shortness of breath.  He has been on Lasix  continuously since he got his kidney.  He talked to his transplant team today and was told that his kidney function is good.  Patient presents here for evaluation of his shortness of breath.  He does report that he has been referred to cardiology in the near future for further workup, but this has not been performed yet.  Patient did try a double dose of his Lasix  today without improvement.       Prior to Admission medications   Medication Sig Start Date End Date Taking? Authorizing Provider  allopurinol  (ZYLOPRIM ) 100 MG tablet TAKE ONE TABLET BY MOUTH EVERY DAY 06/10/24   [name], NP  amLODipine  (NORVASC ) 2.5 MG tablet Take 1 tablet (2.5 mg total) by mouth daily at 8 pm. Patient not taking: Reported on 08/22/2024 03/16/24   Wonda Sharper, MD  aspirin  EC 81 MG tablet Take 81 mg by mouth daily. Swallow whole.    [provider]  azelastine  (ASTELIN ) 0.1 % nasal spray place TWO SPRAY IN EACH NOSTRIL TWICE DAILY 06/10/24   Gallagher, Joel Louis, MD  B Complex-C-Zn-Folic Acid  (DIALYVITE  800/ZINC  PO) Take 1 tablet by mouth daily. 12/14/23   [provider]  bictegravir-emtricitabine -tenofovir  AF (BIKTARVY ) 50-200-25 MG TABS tablet Take 1 tablet by mouth daily. 08/22/24   Dennise Kingsley, MD  cetirizine  (ZYRTEC ) 10 MG tablet TAKE ONE TABLET BY MOUTH EVERY DAY AT 9.00 IN THE MORNING 06/03/24   Iva Marty Saltness, MD  cinacalcet  (SENSIPAR ) 90 MG tablet Take 90  mg by mouth daily. Patient not taking: Reported on 08/22/2024 10/08/23   [provider]  diclofenac  Sodium (VOLTAREN ) 1 % GEL Apply 2 g topically 4 (four) times daily. Apply to chest pain when lidocaine  patch not in place Patient not taking: Reported on 08/22/2024 09/14/22   Bryn Bernardino NOVAK, MD  Doxercalciferol  (HECTOROL  IV) 8 mcg. 03/18/24 03/17/25  [provider]  ezetimibe  (ZETIA ) 10 MG tablet Take 10 mg by mouth daily. Patient not taking: Reported on 08/22/2024 02/10/24   [provider]  famotidine  (PEPCID ) 20 MG tablet Take 1 tablet (20 mg total) by mouth 2 (two) times daily. Patient not taking: Reported on 08/22/2024 11/13/22   Iva Marty Saltness, MD  ferric citrate  (AURYXIA ) 1 GM 210 MG(Fe) tablet Take 420 mg by mouth See admin instructions. Take 420 mg by mouth three times a day with meals and 210 mg with each snack    [provider]  fluticasone  (FLOVENT  HFA) 110 MCG/ACT inhaler Inhale 2 puffs into the lungs 2 (two) times daily as needed (for flares).    [provider]  furosemide  (LASIX ) 40 MG tablet Take 40 mg by mouth. 07/15/24 08/10/25  [provider]  gabapentin  (NEURONTIN ) 300 MG capsule Take 1 capsule (300 mg total) by mouth daily as needed (pain/neuropathy). 09/14/22   Bryn Bernardino NOVAK, MD  hydrocortisone  (ANUSOL -HC) 25 MG suppository Place 1 suppository (25 mg total) rectally at bedtime.  Patient not taking: Reported on 08/22/2024 04/07/24   May, Deanna J, NP  ipratropium (ATROVENT ) 0.06 % nasal spray instill TWO SPRAYS IN EACH NOSTRIL 2-3 times DAILY AS NEEDED 03/10/24   Iva Marty Saltness, MD  ipratropium-albuterol  (DUONEB) 0.5-2.5 (3) MG/3ML SOLN Take 3 mLs by nebulization every 6 (six) hours as needed (Shortness of breath, cough, wheeze or chest tightness). 06/30/24   Jeneal Danita Macintosh, MD  iron  sucrose (VENOFER ) 20 MG/ML injection Inject into the vein 3 (three) times a week. Patient not taking: Reported on  08/22/2024 11/27/23 11/25/24  [provider]  iron  sucrose in sodium chloride  0.9 % 100 mL 50 mg. Patient not taking: Reported on 08/22/2024 03/25/24 03/24/25  [provider]  isosorbide  mononitrate (IMDUR ) 30 MG 24 hr tablet Take 1 tablet (30 mg total) by mouth at bedtime. 06/15/24 09/13/24  Anner Alm ORN, MD  lactulose  (CHRONULAC ) 10 GM/15ML solution Take 45 mLs (30 g total) by mouth daily as needed. Take 15-30 ml up to twice daily for constipation 01/08/23   Pyrtle, Gordy HERO, MD  levalbuterol  (XOPENEX  HFA) 45 MCG/ACT inhaler INHALE TWO puffs into THE lungs EVERY FOUR HOURS AS NEEDED wheezing 06/03/24   Iva Marty Saltness, MD  levothyroxine  (SYNTHROID ) 25 MCG tablet TAKE ONE TABLET BY MOUTH DAILY AT EIGHT a.m. DAILY BEFORE BREAKFAST 08/31/24   [name], NP  LORazepam  (ATIVAN ) 0.5 MG tablet Take 0.5 mg by mouth. Patient not taking: Reported on 08/22/2024 07/27/25   [provider]  lubiprostone  (AMITIZA ) 24 MCG capsule TAKE ONE CAPSULE BY MOUTH TWICE DAILY WITH a meal 07/04/24   Pyrtle, Gordy HERO, MD  melatonin 3 MG TABS tablet Take 3 mg by mouth at bedtime.    [provider]  methocarbamol  (ROBAXIN ) 500 MG tablet Take 1 tablet by mouth. 07/14/24   [provider]  Methoxy PEG-Epoetin  Beta (MIRCERA IJ) every 30 (thirty) days. 11/27/23 11/25/24  [provider]  metoprolol  tartrate (LOPRESSOR ) 25 MG tablet TAKE ONE TABLET BY MOUTH TWICE DAILY 06/17/24   Cooper, Michael, MD  mycophenolate (MYFORTIC) 180 MG EC tablet Take 2 tablets by mouth. 07/14/24   [provider]  Olopatadine  HCl (PATADAY ) 0.7 % SOLN Place 1 drop into both eyes 2 (two) times daily as needed (allergies). 11/13/22   Iva Marty Saltness, MD  pantoprazole  (PROTONIX ) 40 MG tablet Take 1 tablet (40 mg total) by mouth daily. 05/06/24   Iva Marty Saltness, MD  potassium chloride  SA (KLOR-CON  M) 20 MEQ tablet Take 20 mEq by mouth. 08/09/24 08/09/25  [provider]   predniSONE  (DELTASONE ) 5 MG tablet Take 5 mg by mouth. 07/14/24   [provider]  rOPINIRole  (REQUIP ) 1 MG tablet Take 1 tablet (1 mg total) by mouth at bedtime. 10/26/23   Neda Jennet LABOR, MD  rosuvastatin  (CRESTOR ) 10 MG tablet Take 1 tablet (10 mg total) by mouth daily. 10/19/23   Cooper, Michael, MD  sevelamer  carbonate (RENVELA ) 800 MG tablet Take 2,400 mg by mouth daily. 04/22/23   [provider]  Spacer/Aero-Holding Raguel DEVI 1 Device by Does not apply route 2 (two) times a day. 03/01/19   Bobbitt, Elgin Pepper, MD  sulfamethoxazole-trimethoprim (BACTRIM DS) 800-160 MG tablet Take 1 tablet by mouth. 07/15/24   [provider]  SYMBICORT  160-4.5 MCG/ACT inhaler Inhale 2 puffs into the lungs in the morning and at bedtime. 07/01/23   Iva Marty Saltness, MD  tacrolimus ER (ENVARSUS XR) 4 MG TB24 Take 4 mg by mouth. 08/01/24 08/01/25  [provider]    Allergies: Ace inhibitors    Review of Systems  Updated Vital Signs BP (!) 146/102   Pulse 73   Temp 98.4 F (36.9 C) (Oral)   Resp 15   Ht 5' 10 (1.778 m)   Wt 93 kg   SpO2 100%   BMI 29.41 kg/m   Physical Exam Vitals and nursing note reviewed.  Constitutional:      General: He is not in acute distress.    Appearance: He is well-developed.  HENT:     Head: Normocephalic and atraumatic.     Mouth/Throat:     Mouth: Mucous membranes are moist.  Eyes:     General: Vision grossly intact. Gaze aligned appropriately.     Extraocular Movements: Extraocular movements intact.     Conjunctiva/sclera: Conjunctivae normal.  Cardiovascular:     Rate and Rhythm: Normal rate and regular rhythm.     Pulses: Normal pulses.     Heart sounds: Normal heart sounds, S1 normal and S2 normal. No murmur heard.    No friction rub. No gallop.  Pulmonary:     Effort: Tachypnea, accessory muscle usage and respiratory distress present.     Breath sounds: Decreased breath sounds present.  Abdominal:      Palpations: Abdomen is soft.     Tenderness: There is no abdominal tenderness. There is no guarding or rebound.     Hernia: No hernia is present.  Musculoskeletal:        General: No swelling.     Cervical back: Full passive range of motion without pain, normal range of motion and neck supple. No pain with movement, spinous process tenderness or muscular tenderness. Normal range of motion.     Right lower leg: Edema present.     Left lower leg: Edema present.  Skin:    General: Skin is warm and dry.     Capillary Refill: Capillary refill takes less than 2 seconds.     Findings: No ecchymosis, erythema, lesion or wound.  Neurological:     Mental Status: He is alert and oriented to person, place, and time.     GCS: GCS eye subscore is 4. GCS verbal subscore is 5. GCS motor subscore is 6.     Cranial Nerves: Cranial nerves 2-12 are intact.     Sensory: Sensation is intact.     Motor: Motor function is intact. No weakness or abnormal muscle tone.     Coordination: Coordination is intact.  Psychiatric:        Mood and Affect: Mood normal.        Speech: Speech normal.        Behavior: Behavior normal.     (all labs ordered are listed, but only abnormal results are displayed) Labs Reviewed  CBC WITH DIFFERENTIAL/PLATELET - Abnormal; Notable for the following components:      Result Value   RBC 3.27 (*)    Hemoglobin 10.4 (*)    HCT 32.4 (*)    RDW 18.8 (*)    Lymphs Abs 0.6 (*)    Abs Immature Granulocytes 0.18 (*)    All other components within normal limits  BASIC METABOLIC PANEL WITH GFR - Abnormal; Notable for the following components:   CO2 19 (*)    Glucose, Bld 122 (*)    BUN 30 (*)    Creatinine, Ser 2.44 (*)    GFR, Estimated 30 (*)    All other components within normal limits  BRAIN  NATRIURETIC PEPTIDE - Abnormal; Notable for the following components:   B Natriuretic Peptide 1,578.9 (*)    All other components within normal limits  URINALYSIS, ROUTINE W REFLEX  MICROSCOPIC - Abnormal; Notable for the following components:   Color, Urine STRAW (*)    All other components within normal limits  TROPONIN I (HIGH SENSITIVITY) - Abnormal; Notable for the following components:   Troponin I (High Sensitivity) 47 (*)    All other components within normal limits  TROPONIN I (HIGH SENSITIVITY) - Abnormal; Notable for the following components:   Troponin I (High Sensitivity) 52 (*)    All other components within normal limits    EKG: EKG Interpretation Date/Time:  Wednesday August 31 2024 16:29:47 EST Ventricular Rate:  82 PR Interval:  168 QRS Duration:  88 QT Interval:  400 QTC Calculation: 467 R Axis:   73  Text Interpretation: Sinus rhythm with Premature supraventricular complexes Otherwise normal ECG When compared with ECG of 19-Jun-2024 21:30, No significant change since last tracing Confirmed by Haze Lonni PARAS [phone]) on 09/01/2024 2:42:07 AM  Radiology: DG Chest 2 View Result Date: 08/31/2024 CLINICAL DATA:  Shortness of breath EXAM: CHEST - 2 VIEW COMPARISON:  06/20/2024 FINDINGS: Frontal and lateral views of the chest demonstrate stable enlargement of the cardiac silhouette. Postsurgical changes from mitral valve repair. Nerve stimulator within the right chest, lead extending cephalad. Increased pulmonary vascular congestion with mild bibasilar interstitial and ground-glass opacities concerning for developing edema. Trace left pleural effusion. No pneumothorax. IMPRESSION: 1. Constellation of findings suggesting mild congestive heart failure and developing bibasilar edema. Electronically Signed   By: Ozell Daring M.D.   On: 08/31/2024 17:39     Procedures   Medications Ordered in the ED  furosemide  (LASIX ) injection 40 mg (40 mg Intravenous Given 09/01/24 0237)                                    Medical Decision Making Amount and/or Complexity of Data Reviewed External Data Reviewed: labs, radiology and notes. Labs: ordered.  Decision-making details documented in ED Course. Radiology: ordered and independent interpretation performed. Decision-making details documented in ED Course. ECG/medicine tests: ordered and independent interpretation performed. Decision-making details documented in ED Course.  Risk Prescription drug management.   Differential Diagnosis considered includes, but not limited to: CHF exacerbation; ACS/MI; Bronchitis/URI; Pneumonia; PE  Presents to the emergency department for evaluation of shortness of breath.  Patient has a history of renal failure, previously on dialysis, now off dialysis after receiving kidney transplant at [facility] in September.  Records were reviewed.  Patient has had steady improvement of his creatinines over the last month:  10/8    4.59 10/27  3.23 11/5     2.44  As renal function is significantly improved today, doubt rejection or acute renal failure as a cause of the patient's shortness of breath.  No need to transfer to [facility].  Chest x-ray, however, does show evidence of edema.  Record review also shows recent (03/03/2024) echo:  1. Left ventricular ejection fraction, by estimation, is 50 to 55%. The  left ventricle has low normal function. The left ventricle has no regional  wall motion abnormalities. The left ventricular internal cavity size was  mildly dilated. There is mild  concentric left ventricular hypertrophy. Left ventricular diastolic  function could not be evaluated.   2. Right ventricular systolic function is normal. The right ventricular  size is normal. There is mildly elevated pulmonary artery systolic  pressure.   3. Left atrial size was severely dilated.   4. The mitral valve is normal in structure. Mild mitral valve  regurgitation. No evidence of mitral stenosis. There is a prosthetic  annuloplasty ring present in the mitral position.   5. Tricuspid valve regurgitation is mild to moderate.   6. The aortic valve is tricuspid. Aortic valve  regurgitation is mild. No  aortic stenosis is present.   7. The inferior vena cava is normal in size with greater than 50%  respiratory variability, suggesting right atrial pressure of 3 mmHg.   Patient is exhibiting mild respiratory distress.  He gets out of breath speaking but is not currently hypoxic.  Will need hospitalization for management of volume overload.  CRITICAL CARE Performed by: Lonni JINNY Seats   Total critical care time: 30 minutes  Critical care time was exclusive of separately billable procedures and treating other patients.  Critical care was necessary to treat or prevent imminent or life-threatening deterioration.  Critical care was time spent personally by me on the following activities: development of treatment plan with patient and/or surrogate as well as nursing, discussions with consultants, evaluation of patient's response to treatment, examination of patient, obtaining history from patient or surrogate, ordering and performing treatments and interventions, ordering and review of laboratory studies, ordering and review of radiographic studies, pulse oximetry and re-evaluation of patient's condition.      Final diagnoses:  Acute on chronic congestive heart failure, unspecified heart failure type [facility])    ED Discharge Orders     None          Seats Lonni JINNY, MD 09/01/24 [phone]

## 2024-09-01 NOTE — Assessment & Plan Note (Signed)
 Baseline CAD-no active chest pain Troponin 40s to 50s on presentation-looks to be baseline Continue home regimen including aspirin  and Crestor  Monitor

## 2024-09-01 NOTE — Assessment & Plan Note (Addendum)
 Baseline ESRD on hemodialysis status post DDKT July 07, 2024 in the [facility] system-please see transplant service discharge summary for full details On immunosuppressant regimen including Myfortic, tacrolimus, prednisone  CMV ppx: Valcyte x 3 months (EOT 10/06/2024)  PJP ppx:Bactrim x 6 months (EOT 01/12/2025 unless CD4 <200) Will consult nephrology in the setting of active diuresis Monitor

## 2024-09-01 NOTE — Progress Notes (Signed)
 Notified Dr. Eldonna Pts. BP 163/108

## 2024-09-01 NOTE — Progress Notes (Signed)
   09/01/24 2306  BiPAP/CPAP/SIPAP  Reason BIPAP/CPAP not in use Non-compliant   Patient refused of CPAP.

## 2024-09-01 NOTE — Assessment & Plan Note (Addendum)
 On Biktarvy  chronically Patient reports compliance Continue Check CD4 count in setting of immunosuppression s/p renal transplant 06/2024  Monitor

## 2024-09-01 NOTE — Progress Notes (Deleted)
 Cardiology Office Note:    Date:  09/01/2024  ID:  Albert Eaton, DOB [birth year], MRN [id number] PCP: [name], NP  [facility] Providers Cardiologist:  [name], MD Electrophysiologist:  [name], MD { Click to update primary MD,subspecialty MD or APP then REFRESH:1}    {Click to Open Review  :1}   Patient Profile:       Chief Complaint: *** History of Present Illness:  Albert Eaton is a 58 y.o. male with visit-pertinent history noted below  PMH Coronary artery disease, nonobstructive  LHC 09/11/2022: LAD proximal 50, mid 50; OM2 30; RCA proximal 40 HFmrEF (heart failure with mildly reduced ejection fraction) : Initial EF berry MBR was 45 to 50%. ECHO: EF 50 to 55%.  Low normal function.  No RWMA.  Mildly dilated LV with mild concentric LVH.  Unable to assess diastolic parameters.  Mildly elevated PAP.  Severe LA dilation.  Mild MR with prosthetic angioplasty ring in place.  No MS.  Mild to moderate TR.  Mild AI.  Normal RAP.  (03/03/2024) Paroxysmal atrial fibrillation  No longer on amiodarone ,  Currently on Toprol  tartrate 25 mg twice daily and apixaban  5 mg twice daily Mitral regurgitation S/p MV Repair (min inv) 07/2022 ([name]) Echo 03/03/2024: Prosthetic annuloplasty ring present.  No MS with mild MR. Tricuspid regurgitation Hx of PEA arrest  Occurred during dialysis 08/2022 - ?initial rhythm SVT vs AFib Monitor 09/2022: NSR, A-fib burden <1% (controlled rate and RVR), no high-grade AV block, few short ventricular runs  Renal CA s/p L nephrectomy  ESRD on hemodialysis (MWF) - 2018 Hypertension -> on amlodipine  2.5 mg daily along with metoprolol  tartrate 25 mg daily Hyperlipidemia on rosuvastatin  10 mg daily Aortic atherosclerosis Hypothyroidism on Synthroid  25 mcg daily OSA  HIV Hep B Pre-Mitral Valve Repair Dopplers 08/19/2022: Bilateral ICA with minimal wall thickening   He was evaluated by [name] from EP on 06/11/2023.  Noted  that there was progression from atrial tachycardia to clearly atrial flutter.  Anticoagulation was recommended.  After initially being on Eliquis  he was switched to warfarin because of concerns for ability to reverse anticoagulation and consideration of possible urgent renal transplant operation.  He was seen by Dr. Fell on 10/29/2023 and doing well from a cardiac perspective.  He was apparently listed on the kidney transplant list at [name].  Both they have recommended coming off Eliquis  and transitioning to warfarin.  He went to [facility] ER on 06/08/2024 with chest pain over the past several weeks.  Mostly at night when lies down keeping him from sleeping.  Symptoms are worse when he was bending over.  BNP was roughly 1900 and troponins were elevated but flat at 78 and 79.  EKG was sinus rhythm with PACs.  Not felt to be pericarditis.  On warfarin for A-fib but borderline therapeutic therefore less likely to have PE.  Chest pain felt to be potentially chest wall pain.    He followed up with cardiology service with [name] on 06/15/2024.  He reported to feel shortness of breath particularly when lying down.  He has to sit up to sleep and experiences breathlessness when bending down or raising his arms above his head.  Noted the symptoms have worsened over the past several weeks.  He uses an inspire device for sleep apnea which is still being adjusted optimal settings.  There was worry for potential progression of his CAD.  He was started on  Imdur  30 mg daily for potential angina.  Stress PET was ordered however he was able to complete due to difficulty with lying flat.  He underwent Lexiscan  Myoview  on 08/05/2024 which findings were consistent with no ischemia and no infarction.  The study was low risk.  Nuclear stress EF 49%.  He was recently seen in the ED on 08/31/2024 for evaluation of shortness of breath.  Lab work showed significant improvement in his creatinine.  Chest x-ray did show  evidence of edema.  Discussed the use of AI scribe software for clinical note transcription with the patient, who gave verbal consent to proceed.  History of Present Illness     Review of systems:  Please see the history of present illness. All other systems are reviewed and otherwise negative. ***      Studies Reviewed:        ***  Risk Assessment/Calculations:   {Does this patient have ATRIAL FIBRILLATION?:[id number]}          Physical Exam:   VS:  There were no vitals taken for this visit.   Wt Readings from Last 3 Encounters:  08/31/24 205 lb (93 kg)  08/22/24 206 lb 6.4 oz (93.6 kg)  08/05/24 205 lb (93 kg)    GEN: Well nourished, well developed in no acute distress NECK: No JVD; No carotid bruits CARDIAC: ***RRR, no murmurs, rubs, gallops RESPIRATORY:  Clear to auscultation without rales, wheezing or rhonchi  ABDOMEN: Soft, non-tender, non-distended EXTREMITIES:  No edema; No acute deformity ***      Assessment and Plan:    Assessment and Plan Assessment & Plan      {Are you ordering a CV Procedure (e.g. stress test, cath, DCCV, TEE, etc)?   Press F2        :[id number]}  Dispo:  No follow-ups on file.  Signed, [name], NP

## 2024-09-01 NOTE — Assessment & Plan Note (Signed)
 Noted prior history of atrial fibrillation and atrial flutter Was previously on Coumadin  Looks to be on hold from recent renal transplant September 2025 Follow-up cardiology recommendations

## 2024-09-01 NOTE — Assessment & Plan Note (Signed)
 Creatinine 2.14 today with noted recent renal transplant September 2025 GFR in the 30s Overall renal function appears to be slowly improving since transplant with most recent creatinine around 3 prior to presentation today Nephrology formally consulted in the setting of diuresis associated with acute on chronic heart failure Minimize nephrotoxic agents Follow-up

## 2024-09-01 NOTE — Consult Note (Signed)
 Nephrology Consult   Requesting provider: Elspeth Masters Service requesting consult: Hospitalists Reason for consult: history of kidney transplant   Assessment/Recommendations: Albert Eaton is a/an 58 y.o. male with a past medical history asthma, CHF (EF recently ~50%), ESRD s/p transplant in September 2025, HIV, HTN, OSA, CAD who presents with shortness of breath.  Shortness of Breath: Longstanding issue now for several months.  May have mild volume overload but did not think this is the main driver of his long-term symptoms.  EF recently normal.  However, would be concern for high-output heart failure related to his AVF.  Will await cardiology final recommendations.  Recommend they consider this etiology.  I discussed this with his primary transplant nephrologist and they agree it could be the problem.  Would need evaluation by vascular surgery to consider decrease in size of his access. Given poor kidney function full ligation would not be ideal.  Kidney transplanted: As per history.  Delayed graft function.  Recent issues with rejection.  Recently tacrolimus level at goal - Continue Myfortic, prednisone , tacrolimus as prescribed - Continue to monitor creatinine daily while undergoing diuresis  Secondary hyperparathyroidism: PTH recently 1510 on 10/15.  Starting cholecalciferol and Sensipar  30 mg daily at the request of his primary transplant nephrologist. Phos recently low so will repeat here. CTM Ca while on sensipar   HIV: Continue home medications  Atrial flutter: Continue home metoprolol   CHF: Moderately reduced ejection fraction recently around 50%.  History of reduced ejection fraction.  Blood pressure fairly elevated.  Undergoing some diuresis because of concern for volume excess.  Hypertension: Blood pressure elevated at this time.  Continue to monitor with diuresis.  Consider escalation of medications if needed    Recommendations conveyed to primary service.    [facility] 09/01/2024 3:59 PM   _____________________________________________________________________________________ CC: Shortness of breath  History of Present Illness: Albert Eaton is a/an 58 y.o. male with a past medical history of asthma, CHF (EF recently ~50%), ESRD s/p transplant in September 2025, HIV, HTN, OSA, CAD who presents with shortness of breath.  Patient states he has had persistent shortness of breath for 3 to 4 months.  This started before he had his kidney transplant.  Shortness of breath is worse with exertion.  Denies significant edema or weight gain.  Also has some orthopnea.  Denies fevers, chills, chest pain, nausea, vomiting, diarrhea, dysuria, hematuria.  He has seen several specialist in the outpatient setting regarding his shortness of breath.  Workup thus far has not showed a significant abnormality to suggest what the cause is.  Because of his persistent shortness of breath he reached out to cardiology recently.  They said it was unlikely he would get into clinic soon and so he came into the hospital for evaluation.  Does not feel like his symptoms are all that worse than usual.  He underwent kidney transplant in July 07, 2024 at [facility].  He had delayed graft function for about 2 weeks requiring dialysis and then had some improvement in kidney function.  Creatinine did not improve greatly though and he underwent kidney transplant biopsy which demonstrated acute and chronic active TCMR 1A.  The sample also demonstrated several cores that were necrotic in nature.  It was unclear if this was a sampling error or reflective of the entire graft.  He received outpatient IV pulsed steroids with 500 mg of Solu-Medrol  on 10/28, 29, and 30 then resumed home 5mg  prednisone .  Because of the necrotic specimens  there was plan for repeat biopsy in several weeks to monitor the rejection as well as reassess level of necrotic tissue. Crt has been around 3  In  the emergency department there was some concern for volume overload on chest x-ray although it appears mild.  He received some IV Lasix .  Has not seen significant improvement in his symptoms.  Medications:  Current Facility-Administered Medications  Medication Dose Route Frequency Provider Last Rate Last Admin   0.9 %  sodium chloride  infusion  250 mL Intravenous PRN Newton, Steven J, MD       acetaminophen  (TYLENOL ) tablet 650 mg  650 mg Oral Q6H PRN Newton, Steven J, MD       Or   acetaminophen  (TYLENOL ) suppository 650 mg  650 mg Rectal Q6H PRN Eldonna Elspeth PARAS, MD       allopurinol  (ZYLOPRIM ) tablet 100 mg  100 mg Oral Daily Eldonna Elspeth PARAS, MD   100 mg at 09/01/24 9072   aspirin  EC tablet 81 mg  81 mg Oral Daily Garrick Leontine SAILOR, PA-C   81 mg at 09/01/24 1523   atorvastatin  (LIPITOR) tablet 40 mg  40 mg Oral Daily Garrick Leontine SAILOR, PA-C   40 mg at 09/01/24 1523   bictegravir-emtricitabine -tenofovir  AF (BIKTARVY ) 50-200-25 MG per tablet 1 tablet  1 tablet Oral Daily Eldonna Elspeth PARAS, MD   1 tablet at 09/01/24 9071   enoxaparin  (LOVENOX ) injection 40 mg  40 mg Subcutaneous Q24H Eldonna Elspeth PARAS, MD       fluticasone  furoate-vilanterol (BREO ELLIPTA ) 100-25 MCG/ACT 1 puff  1 puff Inhalation Daily Eldonna Elspeth PARAS, MD   1 puff at 09/01/24 9062   isosorbide  mononitrate (IMDUR ) 24 hr tablet 30 mg  30 mg Oral Daily Garrick Leontine SAILOR, PA-C   30 mg at 09/01/24 1523   metoprolol  tartrate (LOPRESSOR ) tablet 12.5 mg  12.5 mg Oral BID Eldonna Elspeth PARAS, MD   12.5 mg at 09/01/24 1319   mycophenolate (MYFORTIC) EC tablet 360 mg  360 mg Oral TID Eldonna Elspeth PARAS, MD   360 mg at 09/01/24 1523   predniSONE  (DELTASONE ) tablet 5 mg  5 mg Oral Q breakfast Eldonna Elspeth PARAS, MD   5 mg at 09/01/24 9072   sodium chloride  flush (NS) 0.9 % injection 3 mL  3 mL Intravenous Q12H Eldonna Elspeth PARAS, MD   3 mL at 09/01/24 1235   sodium chloride  flush (NS) 0.9 % injection 3 mL  3 mL Intravenous PRN Eldonna Elspeth PARAS, MD        [START ON 09/02/2024] sulfamethoxazole-trimethoprim (BACTRIM DS) 800-160 MG per tablet 1 tablet  1 tablet Oral Q M,W,F Eldonna Elspeth PARAS, MD       tacrolimus ER (ENVARSUS XR) tablet TB24 8 mg  8 mg Oral Daily Eldonna Elspeth PARAS, MD   8 mg at 09/01/24 1019   And   [START ON 09/02/2024] tacrolimus ER (ENVARSUS XR) tablet 1 mg  1 mg Oral QAC breakfast Eldonna Elspeth PARAS, MD   1 mg at 09/01/24 1022     ALLERGIES Ace inhibitors  MEDICAL HISTORY Past Medical History:  Diagnosis Date   Anemia    Aortic atherosclerosis    Arthritis    Ascending aorta dilation 02/17/2023   Limited TTE 02/17/23: no effusion, s/p MV repair w trivial residual MR, asc aorta 39 mm, mod TR, severe LAE   Asthma    Cancer (HCC)    renal cyst   Chronic diastolic CHF (congestive heart failure) (HCC)  01/06/2017   Echo 7/16 [facility] in [place], [place]) Mild AI, mild LAE, mild concentric LVH, EF 55, normal wall motion, mild to moderate MR, mild PI, RVSP 55 // Echo 10/09/16 ([facility]):  Moderate LVH, grade 2 diastolic dysfunction, mild MR, moderate LAE    Dysrhythmia    paroxysmal afib/flutter   Esophagitis    ESRD (end stage renal disease) on dialysis [facility])    Dialysis Mon Wed Fri   GERD (gastroesophageal reflux disease)    Gout    no current problems   Heart murmur    had it since he was a baby- pt had echo 10/01/23   Hepatitis    Hep B   HIV (human immunodeficiency virus infection) (HCC)    HLD (hyperlipidemia)    Hypertension    Hypothyroidism    Mitral regurgitation    Myocardial infarction (HCC)    Neuropathy    Sleep apnea    does not use c-pap   Stroke [facility])    Wears glasses    Wears partial dentures      SOCIAL HISTORY Social History   Socioeconomic History   Marital status: Single    Spouse name: Not on file   Number of children: 0   Years of education: Not on file   Highest education level: Not on file  Occupational History   Not on file  Tobacco Use   Smoking status: Former    Current  packs/day: 0.00    Average packs/day: 1 pack/day for 18.0 years (18.0 ttl pk-yrs)    Types: Cigarettes    Start date: 1    Quit date: 2000    Years since quitting: 25.8   Smokeless tobacco: Never  Vaping Use   Vaping status: Never Used  Substance and Sexual Activity   Alcohol use: Yes    Comment: just special occasions/holidays   Drug use: No   Sexual activity: Not Currently    Partners: Male  Other Topics Concern   Not on file  Social History Narrative   Disability      [place]: none   Social Drivers of Health   Financial Resource Strain: High Risk (07/13/2023)   Overall Financial Resource Strain (CARDIA)    Difficulty of Paying Living Expenses: Hard  Food Insecurity: No Food Insecurity (09/01/2024)   Hunger Vital Sign    Worried About Running Out of Food in the Last Year: Never true    Ran Out of Food in the Last Year: Never true  Transportation Needs: No Transportation Needs (09/01/2024)   PRAPARE - Administrator, Civil Service (Medical): No    Lack of Transportation (Non-Medical): No  Physical Activity: Inactive (07/13/2023)   Exercise Vital Sign    Days of Exercise per Week: 0 days    Minutes of Exercise per Session: 0 min  Stress: No Stress Concern Present (07/13/2023)   [employer] of Occupational Health - Occupational Stress Questionnaire    Feeling of Stress : Not at all  Social Connections: Socially Isolated (07/13/2023)   Social Connection and Isolation Panel    Frequency of Communication with Friends and Family: More than three times a week    Frequency of Social Gatherings with Friends and Family: More than three times a week    Attends Religious Services: Never    Database Administrator or Organizations: No    Attends Banker Meetings: Never    Marital Status: Never married  Catering Manager Violence: Not At Risk (  09/01/2024)   Humiliation, Afraid, Rape, and Kick questionnaire    Fear of Current or Ex-Partner: No     Emotionally Abused: No    Physically Abused: No    Sexually Abused: No     FAMILY HISTORY Family History  Problem Relation Age of Onset   Hypertension Mother    Heart failure Mother    Hypertension Father    Alcohol abuse Father    Hypertension Sister    Multiple sclerosis Sister    Hypertension Sister    Hypertension Brother    Heart attack Maternal Grandmother 67   Scoliosis Other    Allergic rhinitis Neg Hx    Angioedema Neg Hx    Asthma Neg Hx    Eczema Neg Hx    Immunodeficiency Neg Hx    Urticaria Neg Hx    Colon cancer Neg Hx    Pancreatic cancer Neg Hx    Esophageal cancer Neg Hx       Review of Systems: 12 systems reviewed Otherwise as per HPI, all other systems reviewed and negative  Physical Exam: Vitals:   09/01/24 1200 09/01/24 1237  BP: (!) 171/121 (!) 156/108  Pulse: 90   Resp: 20   Temp: 97.9 F (36.6 C)   SpO2: 100%    No intake/output data recorded.  Intake/Output Summary (Last 24 hours) at 09/01/2024 1559 Last data filed at 09/01/2024 0600 Gross per 24 hour  Intake --  Output 1800 ml  Net -1800 ml   General: well-appearing, no acute distress HEENT: anicteric sclera, oropharynx clear without lesions CV: regular rate, normal rhythm, no murmurs, no edema Lungs: Crackles bilaterally, normal work of breathing Abd: soft, non-tender, non-distended Skin: no visible lesions or rashes Psych: alert, engaged, appropriate mood and affect Musculoskeletal: no obvious deformities Neuro: normal speech, no gross focal deficits   Test Results Reviewed Lab Results  Component Value Date   NA 137 08/31/2024   K 4.3 08/31/2024   CL 109 08/31/2024   CO2 19 (L) 08/31/2024   BUN 30 (H) 08/31/2024   CREATININE 2.44 (H) 08/31/2024   GFR 9.26 (LL) 06/01/2024   CALCIUM  8.9 08/31/2024   ALBUMIN  3.3 (L) 08/03/2024   PHOS 4.3 03/03/2024    CBC Recent Labs  Lab 08/31/24 1612  WBC 7.1  NEUTROABS 5.6  HGB 10.4*  HCT 32.4*  MCV 99.1  PLT 205    I  have reviewed all relevant outside healthcare records related to the patient's current hospitalization

## 2024-09-01 NOTE — H&P (Addendum)
 History and Physical    Patient: Albert Eaton FMW:[id number] DOB: 12-07-1965 DOA: 08/31/2024 DOS: the patient was seen and examined on 09/01/2024 PCP: [name], NP  Patient coming from: Home  Chief Complaint:  Chief Complaint  Patient presents with   Shortness of Breath   HPI: Albert Eaton is a 58 y.o. male with medical history significant of asthma, chronic HFrEF, ESRD status post renal transplant September 2025, HIV, hypertension, hypothyroidism, sleep apnea, CAD, atrial flutter presenting with acute on chronic HFrEF.  Patient reports 3 to 4 months of increased work of breathing, dyspnea on exertion, orthopnea PND.  Had a renal transplant in the [facility] system September 2025.  Per the patient, he was encouraged to drink plenty of water  daily to help with renal function.  Patient states that shortness of breath and orthopnea has only worsened since this time.  Not checking weight on a daily basis.  Has been compliant with home antirejection regimen.  No focal hemiparesis or confusion.  No belly pain or diarrhea.  Has had some worsening lower extremity swelling.  Denies any NSAID intake or high salt use.  No reported alcohol or tobacco use.  Has seen doctors including his PCP and allergist to discuss shortness of breath..  Has not been checking his weight daily. Presented to the ER afebrile, hemodynamically stable.  Satting well on room air.  White count 7.1, hemoglobin 10.4, platelets 205.  Troponin 40s to 50s.  Urinalysis not indicative of infection.  Creatinine 2.44 with GFR in the 30s.  BNP of 1579.  Chest x-ray with CHF and bibasilar edema.  EKG with sinus rhythm and PVCs.  QTc 437. Review of Systems: As mentioned in the history of present illness. All other systems reviewed and are negative. Past Medical History:  Diagnosis Date   Anemia    Aortic atherosclerosis    Arthritis    Ascending aorta dilation 02/17/2023   Limited TTE 02/17/23: no effusion, s/p MV repair w trivial residual  MR, asc aorta 39 mm, mod TR, severe LAE   Asthma    Cancer (HCC)    renal cyst   Chronic diastolic CHF (congestive heart failure) (HCC) 01/06/2017   Echo 7/16 [facility] in [place], [place]) Mild AI, mild LAE, mild concentric LVH, EF 55, normal wall motion, mild to moderate MR, mild PI, RVSP 55 // Echo 10/09/16 ([facility]):  Moderate LVH, grade 2 diastolic dysfunction, mild MR, moderate LAE    Dysrhythmia    paroxysmal afib/flutter   Esophagitis    ESRD (end stage renal disease) on dialysis [facility])    Dialysis Mon Wed Fri   GERD (gastroesophageal reflux disease)    Gout    no current problems   Heart murmur    had it since he was a baby- pt had echo 10/01/23   Hepatitis    Hep B   HIV (human immunodeficiency virus infection) (HCC)    HLD (hyperlipidemia)    Hypertension    Hypothyroidism    Mitral regurgitation    Myocardial infarction (HCC)    Neuropathy    Sleep apnea    does not use c-pap   Stroke [facility])    Wears glasses    Wears partial dentures    Past Surgical History:  Procedure Laterality Date   AV FISTULA PLACEMENT Left 07/02/2018   Procedure: Creation of Left arm BRACHIOBASILIC ARTERIOVENOUS  FISTULA;  Surgeon: Gretta Lonni PARAS, MD;  Location: [facility] OR;  Service: Vascular;  Laterality: Left;  BASCILIC VEIN TRANSPOSITION Left 08/27/2018   Procedure: Left arm BRACHIOBASILIC VEIN TRANSPOSITION SECOND STAGE;  Surgeon: Gretta Lonni PARAS, MD;  Location: [facility] OR;  Service: Vascular;  Laterality: Left;   BUBBLE STUDY  09/03/2020   Procedure: BUBBLE STUDY;  Surgeon: Okey Vina GAILS, MD;  Location: [facility] ENDOSCOPY;  Service: Cardiovascular;;   BUBBLE STUDY  06/03/2022   Procedure: BUBBLE STUDY;  Surgeon: Loni Soyla LABOR, MD;  Location: [facility] ENDOSCOPY;  Service: Cardiology;;   CAPD INSERTION N/A 05/30/2021   Procedure: LAPAROSCOPIC INSERTION CONTINUOUS AMBULATORY PERITONEAL DIALYSIS  (CAPD) CATHETER;  Surgeon: Magda Debby SAILOR, MD;  Location: [facility] OR;  Service: Vascular;  Laterality:  N/A;   CAPD REMOVAL N/A 02/13/2022   Procedure: PERITONEAL DIALYSIS CATHETER REMOVAL;  Surgeon: Magda Debby SAILOR, MD;  Location: [facility] OR;  Service: Vascular;  Laterality: N/A;   CHOLECYSTECTOMY     COLONOSCOPY W/ BIOPSIES AND POLYPECTOMY     DRUG INDUCED ENDOSCOPY Bilateral 07/06/2023   Procedure: DRUG INDUCED SLEEP ENDOSCOPY;  Surgeon: Carlie Clark, MD;  Location: [facility] OR;  Service: ENT;  Laterality: Bilateral;   GIVENS CAPSULE STUDY N/A 03/01/2018   Procedure: GIVENS CAPSULE STUDY;  Surgeon: Albertus Gordy HERO, MD;  Location: [facility] ENDOSCOPY;  Service: Gastroenterology;  Laterality: N/A;   HERNIA REPAIR     As baby   IMPLANTATION OF HYPOGLOSSAL NERVE STIMULATOR Right 01/14/2024   Procedure: RIGHT IMPLANTATION OF HYPOGLOSSAL NERVE STIMULATOR;  Surgeon: Carlie Clark, MD;  Location: [facility] OR;  Service: ENT;  Laterality: Right;   INSERTION OF DIALYSIS CATHETER Right 01/20/2024   Procedure: INSERTION OF DIALYSIS CATHETER USING 19cm PALINDROME CHRONIC CATHETER INTO THE RIGHT INTERNAL JUGULAR;  Surgeon: Gretta Lonni PARAS, MD;  Location: [facility] OR;  Service: Vascular;  Laterality: Right;   LEFT HEART CATH AND CORONARY ANGIOGRAPHY N/A 09/11/2022   Procedure: LEFT HEART CATH AND CORONARY ANGIOGRAPHY;  Surgeon: Dann Candyce RAMAN, MD;  Location: [facility] INVASIVE CV LAB;  Service: Cardiovascular;  Laterality: N/A;   MITRAL VALVE REPAIR N/A 08/21/2022   Procedure: MINIMALLY INVASIVE MITRAL VALVE REPAIR USING RING ANNULOPLASTY SIMULUS 30mm;  Surgeon: Maryjane Mt, MD;  Location: [facility] OR;  Service: Open Heart Surgery;  Laterality: N/A;   MULTIPLE TOOTH EXTRACTIONS     NEPHRECTOMY Left 02/18/2021   NOSE SURGERY     RENAL BIOPSY     REVISON OF ARTERIOVENOUS FISTULA Left 01/20/2024   Procedure: REVISON OF ARTERIOVENOUS FISTULA WITH ARTERIAL PLICATION;  Surgeon: Gretta Lonni PARAS, MD;  Location: [name] OR;  Service: Vascular;  Laterality: Left;   RIGHT/LEFT HEART CATH AND CORONARY ANGIOGRAPHY N/A 09/04/2021   Procedure: RIGHT/LEFT  HEART CATH AND CORONARY ANGIOGRAPHY;  Surgeon: Verlin Lonni BIRCH, MD;  Location: [facility] INVASIVE CV LAB;  Service: Cardiovascular;  Laterality: N/A;   RIGHT/LEFT HEART CATH AND CORONARY ANGIOGRAPHY N/A 08/01/2022   Procedure: RIGHT/LEFT HEART CATH AND CORONARY ANGIOGRAPHY;  Surgeon: Wonda Sharper, MD;  Location: [facility] INVASIVE CV LAB;  Service: Cardiovascular;  Laterality: N/A;   TEE WITHOUT CARDIOVERSION N/A 09/03/2020   Procedure: TRANSESOPHAGEAL ECHOCARDIOGRAM (TEE);  Surgeon: Okey Vina GAILS, MD;  Location: [facility] ENDOSCOPY;  Service: Cardiovascular;  Laterality: N/A;   TEE WITHOUT CARDIOVERSION N/A 06/03/2022   Procedure: TRANSESOPHAGEAL ECHOCARDIOGRAM (TEE);  Surgeon: Loni Soyla LABOR, MD;  Location: [facility] ENDOSCOPY;  Service: Cardiology;  Laterality: N/A;   TEE WITHOUT CARDIOVERSION N/A 08/21/2022   Procedure: TRANSESOPHAGEAL ECHOCARDIOGRAM (TEE);  Surgeon: Maryjane Mt, MD;  Location: [facility] OR;  Service: Open Heart Surgery;  Laterality: N/A;   UPPER GI ENDOSCOPY  04/05/2021  Social History:  reports that he quit smoking about 25 years ago. His smoking use included cigarettes. He started smoking about 43 years ago. He has a 18 pack-year smoking history. He has never used smokeless tobacco. He reports current alcohol use. He reports that he does not use drugs.  Allergies  Allergen Reactions   Ace Inhibitors Cough and Other (See Comments)    Family History  Problem Relation Age of Onset   Hypertension Mother    Heart failure Mother    Hypertension Father    Alcohol abuse Father    Hypertension Sister    Multiple sclerosis Sister    Hypertension Sister    Hypertension Brother    Heart attack Maternal Grandmother 49   Scoliosis Other    Allergic rhinitis Neg Hx    Angioedema Neg Hx    Asthma Neg Hx    Eczema Neg Hx    Immunodeficiency Neg Hx    Urticaria Neg Hx    Colon cancer Neg Hx    Pancreatic cancer Neg Hx    Esophageal cancer Neg Hx     Prior to Admission medications    Medication Sig Start Date End Date Taking? Authorizing Provider  allopurinol  (ZYLOPRIM ) 100 MG tablet TAKE ONE TABLET BY MOUTH EVERY DAY 06/10/24  Yes [name], NP  amLODipine  (NORVASC ) 2.5 MG tablet Take 1 tablet (2.5 mg total) by mouth daily at 8 pm. Patient taking differently: Take 2.5 mg by mouth daily. 03/16/24  Yes Wonda Sharper, MD  aspirin  EC 81 MG tablet Take 81 mg by mouth daily. Swallow whole.   Yes [provider]  azelastine  (ASTELIN ) 0.1 % nasal spray place TWO SPRAY IN EACH NOSTRIL TWICE DAILY 06/10/24  Yes Iva Marty Saltness, MD  bictegravir-emtricitabine -tenofovir  AF (BIKTARVY ) 50-200-25 MG TABS tablet Take 1 tablet by mouth daily. 08/22/24  Yes Dennise Kingsley, MD  cetirizine  (ZYRTEC ) 10 MG tablet TAKE ONE TABLET BY MOUTH EVERY DAY AT 9.00 IN THE MORNING 06/03/24  Yes Iva Marty Saltness, MD  fluticasone  (FLOVENT  HFA) 110 MCG/ACT inhaler Inhale 2 puffs into the lungs 2 (two) times daily as needed (for flares).   Yes [provider]  furosemide  (LASIX ) 40 MG tablet Take 40 mg by mouth daily. 07/15/24 08/10/25 Yes [provider]  gabapentin  (NEURONTIN ) 100 MG capsule Take 100 mg by mouth 3 (three) times daily. 07/15/24  Yes [provider]  ipratropium (ATROVENT ) 0.06 % nasal spray instill TWO SPRAYS IN EACH NOSTRIL 2-3 times DAILY AS NEEDED 03/10/24  Yes Iva Marty Saltness, MD  ipratropium-albuterol  (DUONEB) 0.5-2.5 (3) MG/3ML SOLN Take 3 mLs by nebulization every 6 (six) hours as needed (Shortness of breath, cough, wheeze or chest tightness). 06/30/24  Yes Padgett, Danita Macintosh, MD  isosorbide  mononitrate (IMDUR ) 30 MG 24 hr tablet Take 1 tablet (30 mg total) by mouth at bedtime. 06/15/24 09/13/24 Yes Anner Alm ORN, MD  levalbuterol  (XOPENEX  HFA) 45 MCG/ACT inhaler INHALE TWO puffs into THE lungs EVERY FOUR HOURS AS NEEDED wheezing 06/03/24  Yes Iva Marty Saltness, MD  levothyroxine  (SYNTHROID ) 25 MCG tablet TAKE ONE TABLET BY  MOUTH DAILY AT EIGHT a.m. DAILY BEFORE BREAKFAST 08/31/24  Yes [name], NP  melatonin 3 MG TABS tablet Take 3 mg by mouth at bedtime.   Yes [provider]  metoprolol  tartrate (LOPRESSOR ) 25 MG tablet TAKE ONE TABLET BY MOUTH TWICE DAILY Patient taking differently: Take 12.5 mg by mouth 2 (two) times daily. 06/17/24  Yes Wonda Sharper, MD  mycophenolate (MYFORTIC) 180 MG EC tablet Take 2 tablets by mouth in the morning, at noon, and at bedtime. 07/14/24  Yes [provider]  Olopatadine  HCl (PATADAY ) 0.7 % SOLN Place 1 drop into both eyes 2 (two) times daily as needed (allergies). 11/13/22  Yes Iva Marty Saltness, MD  pantoprazole  (PROTONIX ) 40 MG tablet Take 1 tablet (40 mg total) by mouth daily. 05/06/24  Yes Iva Marty Saltness, MD  potassium chloride  SA (KLOR-CON  M) 20 MEQ tablet Take 20 mEq by mouth daily. 08/09/24 08/09/25 Yes [provider]  predniSONE  (DELTASONE ) 5 MG tablet Take 5 mg by mouth daily with breakfast. 07/14/24  Yes [provider]  rOPINIRole  (REQUIP ) 1 MG tablet Take 1 tablet (1 mg total) by mouth at bedtime. 10/26/23  Yes Olalere, Adewale A, MD  rosuvastatin  (CRESTOR ) 10 MG tablet Take 1 tablet (10 mg total) by mouth daily. 10/19/23  Yes Cooper, Michael, MD  sulfamethoxazole-trimethoprim (BACTRIM DS) 800-160 MG tablet Take 1 tablet by mouth every Monday, Wednesday, and Friday. 07/15/24  Yes [provider]  SYMBICORT  160-4.5 MCG/ACT inhaler Inhale 2 puffs into the lungs in the morning and at bedtime. 07/01/23  Yes Iva Marty Saltness, MD  tacrolimus ER (ENVARSUS XR) 4 MG TB24 Take 9 mg by mouth. Take 8mg  ER tablets and 1mg  ER tablet at 0900 for total of 9mg  ER. 08/01/24 08/01/25 Yes [provider]  ezetimibe  (ZETIA ) 10 MG tablet Take 10 mg by mouth daily. Patient not taking: No sig reported 02/10/24   [provider]  famotidine  (PEPCID ) 20 MG tablet Take 1 tablet (20 mg total) by mouth 2 (two) times  daily. Patient not taking: Reported on 09/01/2024 11/13/22   Iva Marty Saltness, MD  lactulose  (CHRONULAC ) 10 GM/15ML solution Take 45 mLs (30 g total) by mouth daily as needed. Take 15-30 ml up to twice daily for constipation Patient not taking: Reported on 09/01/2024 01/08/23   Pyrtle, Gordy HERO, MD  lubiprostone  (AMITIZA ) 24 MCG capsule TAKE ONE CAPSULE BY MOUTH TWICE DAILY WITH a meal Patient not taking: Reported on 09/01/2024 07/04/24   Pyrtle, Gordy HERO, MD  sevelamer  carbonate (RENVELA ) 800 MG tablet Take 2,400 mg by mouth daily. Patient not taking: Reported on 09/01/2024 04/22/23   [provider]  Spacer/Aero-Holding Raguel DEVI 1 Device by Does not apply route 2 (two) times a day. Patient not taking: Reported on 09/01/2024 03/01/19   Green Elgin Pepper, MD    Physical Exam: Vitals:   09/01/24 0505 09/01/24 0600 09/01/24 0645 09/01/24 0700  BP: (!) 167/104 (!) 172/111  (!) 166/89  Pulse: 86 72  91  Resp: (!) 23 (!) 23 20   Temp:  97.8 F (36.6 C)    TempSrc:      SpO2: 99% 100%  100%  Weight:      Height:       Physical Exam Constitutional:      Appearance: He is obese.  HENT:     Head: Normocephalic and atraumatic.     Nose: Nose normal.     Mouth/Throat:     Mouth: Mucous membranes are moist.  Eyes:     Pupils: Pupils are equal, round, and reactive to light.  Cardiovascular:     Rate and Rhythm: Normal rate and regular rhythm.  Pulmonary:     Effort: Pulmonary effort is normal.  Abdominal:     General: Bowel sounds are normal.  Musculoskeletal:        General: Normal range of motion.  Skin:    General: Skin is warm.  Neurological:     General: No focal deficit present.  Psychiatric:        Mood and Affect: Mood normal.     Data Reviewed:  There are no new results to review at this time.  DG Chest 2 View CLINICAL DATA:  Shortness of breath  EXAM: CHEST - 2 VIEW  COMPARISON:  06/20/2024  FINDINGS: Frontal and lateral views of the chest  demonstrate stable enlargement of the cardiac silhouette. Postsurgical changes from mitral valve repair. Nerve stimulator within the right chest, lead extending cephalad. Increased pulmonary vascular congestion with mild bibasilar interstitial and ground-glass opacities concerning for developing edema. Trace left pleural effusion. No pneumothorax.  IMPRESSION: 1. Constellation of findings suggesting mild congestive heart failure and developing bibasilar edema.  Electronically Signed   By: Ozell Daring M.D.   On: 08/31/2024 17:39  Lab Results  Component Value Date   WBC 7.1 08/31/2024   HGB 10.4 (L) 08/31/2024   HCT 32.4 (L) 08/31/2024   MCV 99.1 08/31/2024   PLT 205 08/31/2024   Last metabolic panel Lab Results  Component Value Date   GLUCOSE 122 (H) 08/31/2024   NA 137 08/31/2024   K 4.3 08/31/2024   CL 109 08/31/2024   CO2 19 (L) 08/31/2024   BUN 30 (H) 08/31/2024   CREATININE 2.44 (H) 08/31/2024   GFRNONAA 30 (L) 08/31/2024   CALCIUM  8.9 08/31/2024   PHOS 4.3 03/03/2024   PROT 7.0 08/22/2024   ALBUMIN  3.3 (L) 08/03/2024   LABGLOB 2.8 09/20/2018   AGRATIO 1.7 09/20/2018   BILITOT 0.7 08/22/2024   ALKPHOS 134 (H) 08/03/2024   AST 23 08/22/2024   ALT 31 08/22/2024   ANIONGAP 9 08/31/2024    Assessment and Plan: Acute on chronic HFrEF (heart failure with reduced ejection fraction) (HCC) 2D echo May 2025 with EF of 50 to 55%, mild MR, moderate TR, mild AR, mild aortic stenosis Progressive increased work of breathing, orthopnea as well as dyspnea exertion for multiple months with noted recent renal transplant September 2025 BNP 1600 Chest x-ray with CHF and bilateral edema Status post IV Lasix  in the ER Monitor urine output Strict Is and Os and daily weights Consult cardiology for appropriate diuresis in the setting of ESRD status post renal transplant Monitor  History of ESRD status post renal transplant Baseline ESRD on hemodialysis status post DDKT  July 07, 2024 in the [facility] system-please see transplant service discharge summary for full details On immunosuppressant regimen including Myfortic, tacrolimus, prednisone  CMV ppx: Valcyte x 3 months (EOT 10/06/2024)  PJP ppx:Bactrim x 6 months (EOT 01/12/2025 unless CD4 <200) Will consult nephrology in the setting of active diuresis Monitor  Atrial flutter, unspecified type [facility]) Noted prior history of atrial fibrillation and atrial flutter Was previously on Coumadin  Looks to be on hold from recent renal transplant September 2025 Follow-up cardiology recommendations   CAD (coronary artery disease) Baseline CAD-no active chest pain Troponin 40s to 50s on presentation-looks to be baseline Continue home regimen including aspirin  and Crestor  Monitor  Obstructive sleep apnea CPAP  Chronic kidney disease, unspecified Creatinine 2.14 today with noted recent renal transplant September 2025 GFR in the 30s Overall renal function appears to be slowly improving since transplant with most recent creatinine around 3 prior to presentation today Nephrology formally consulted in the setting of diuresis associated with acute on chronic heart failure Minimize nephrotoxic agents Follow-up  Arthritis, gouty Continue allopurinol   HIV disease (HCC) On  Biktarvy  chronically Patient reports compliance Continue Check CD4 count in setting of immunosuppression s/p renal transplant 06/2024  Monitor      Advance Care Planning:   Code Status: Full Code   Consults: Cardiology, Nephrology   Family Communication: No family at the bedside   Severity of Illness: The appropriate patient status for this patient is OBSERVATION. Observation status is judged to be reasonable and necessary in order to provide the required intensity of service to ensure the patient's safety. The patient's presenting symptoms, physical exam findings, and initial radiographic and laboratory data in the context of their medical  condition is felt to place them at decreased risk for further clinical deterioration. Furthermore, it is anticipated that the patient will be medically stable for discharge from the hospital within 2 midnights of admission.   Author: Elspeth JINNY Masters, MD 09/01/2024 10:55 AM  For on call review [email or web address].

## 2024-09-01 NOTE — Assessment & Plan Note (Signed)
 CPAP.

## 2024-09-01 NOTE — Consult Note (Addendum)
 Cardiology Consultation   Patient ID: Albert Eaton MRN: [id number]; DOB: August 19, 1966  Admit date: 08/31/2024 Date of Consult: 09/01/2024  PCP:  [name], NP   [facility] Providers Cardiologist:  [name], MD  Electrophysiologist:  [name], MD       Patient Profile: Albert Eaton is a 58 y.o. male with a hx of chronic HFmrEF [ 45-50%] and chronic diastolic heart failure, atrial flutter/fibrillation not on anticoagulation, moderate CAD, hypertension, hypotension with dialysis, pulmonary artery hypertension, ESRD s/p renal transplant 06/2024, OSA with inspire in place, MR s/p MVR in 2023, asthma, hypothyroidism, hep B and HIV who is being seen 09/01/2024 for the evaluation of heart failure exacerbation at the request of Elspeth Masters MD.  History of Present Illness: In 2023 patient cardiac arrested during dialysis, he was admitted for further workup during that admission patient had an echocardiogram that showed EF to be 45 to 50%. LHC 08/2022 showing 50% stenosis to the proximal and mid LAD.  40% stenosis to proximal RCA.  30% stenosis to second margin.  Suspected that the cardiac arrest was due to a PEA arrest.  After discharge, patient wore a monitor showing normal sinus rhythm, A-fib burden was less than 1%.  No high-grade AV blocks.    On chart review patient has had multiple cardiology outpatient visits for the symptom of shortness of breath.  It has been thought to be multifactorial historically.  He was last seen by [name] 05/2024 and at that point was reporting dyspnea on exertion and orthopnea.  He was also describing atypical anginal symptoms. He was recommended to obtain a an outpatient stress test.  He was also started on Imdur  30.  On 10/10 patient had a Myoview  which showed no findings consistent with ischemia or infarction.  Of note patient was on warfarin 2/2 concerns for ability to reverse anticoagulation and consideration for possible  urgent renal transplant. Patient received his renal transplant at [facility] September 2025.  He was not restarted on anticoagulation after his surgery.  Presented to the ED on 11/5 for shortness of breath. BP: 155/111    HR 79 ECG sinus rhythm with atrial ectopy, TWI in avL VR 82 [similar to prior] CXR showed congestive heart failure with developing bibasilar edema Echo pending  Pertinent lab work: Creatinine 2.44 BNP 1578     troponin 47 -> 52 Patient has received IV Lasix  40 mg. Net IO Since Admission: -1,800 mL [09/01/24 1333]  On interview, patient shared his DOE and orthopnea have been ongoing for months, similar to when he met with [name]. No change.  His chest pain is still intermittent, described as a high left sided chest pain/shoulder pain that is sharp and lasts about 15 minutes. Last episode occurred at rest.  Some dizziness, no palpitations. He did experience right leg edema after the surgery which has healed. Some ongoing abdominal distention 2/2 surgery.   Patient is on lasix  40 mg daily PTA. Reported that he didn't feel like he had a lot of urine output on this dose. Had mildly increase response with IV lasix  given this admission.    Past Medical History:  Diagnosis Date   Anemia    Aortic atherosclerosis    Arthritis    Ascending aorta dilation 02/17/2023   Limited TTE 02/17/23: no effusion, s/p MV repair w trivial residual MR, asc aorta 39 mm, mod TR, severe LAE   Asthma    Cancer (HCC)    renal cyst  Chronic diastolic CHF (congestive heart failure) (HCC) 01/06/2017   Echo 7/16 [facility] in [place], [place]) Mild AI, mild LAE, mild concentric LVH, EF 55, normal wall motion, mild to moderate MR, mild PI, RVSP 55 // Echo 10/09/16 ([facility]):  Moderate LVH, grade 2 diastolic dysfunction, mild MR, moderate LAE    Dysrhythmia    paroxysmal afib/flutter   Esophagitis    ESRD (end stage renal disease) on dialysis [facility])    Dialysis Mon Wed Fri   GERD (gastroesophageal  reflux disease)    Gout    no current problems   Heart murmur    had it since he was a baby- pt had echo 10/01/23   Hepatitis    Hep B   HIV (human immunodeficiency virus infection) (HCC)    HLD (hyperlipidemia)    Hypertension    Hypothyroidism    Mitral regurgitation    Myocardial infarction (HCC)    Neuropathy    Sleep apnea    does not use c-pap   Stroke [facility])    Wears glasses    Wears partial dentures     Past Surgical History:  Procedure Laterality Date   AV FISTULA PLACEMENT Left 07/02/2018   Procedure: Creation of Left arm BRACHIOBASILIC ARTERIOVENOUS  FISTULA;  Surgeon: Gretta Lonni PARAS, MD;  Location: [facility] OR;  Service: Vascular;  Laterality: Left;   BASCILIC VEIN TRANSPOSITION Left 08/27/2018   Procedure: Left arm BRACHIOBASILIC VEIN TRANSPOSITION SECOND STAGE;  Surgeon: Gretta Lonni PARAS, MD;  Location: [facility] OR;  Service: Vascular;  Laterality: Left;   BUBBLE STUDY  09/03/2020   Procedure: BUBBLE STUDY;  Surgeon: Okey Vina GAILS, MD;  Location: [name] ENDOSCOPY;  Service: Cardiovascular;;   BUBBLE STUDY  06/03/2022   Procedure: BUBBLE STUDY;  Surgeon: Loni Soyla LABOR, MD;  Location: [facility] ENDOSCOPY;  Service: Cardiology;;   CAPD INSERTION N/A 05/30/2021   Procedure: LAPAROSCOPIC INSERTION CONTINUOUS AMBULATORY PERITONEAL DIALYSIS  (CAPD) CATHETER;  Surgeon: Magda Debby SAILOR, MD;  Location: [facility] OR;  Service: Vascular;  Laterality: N/A;   CAPD REMOVAL N/A 02/13/2022   Procedure: PERITONEAL DIALYSIS CATHETER REMOVAL;  Surgeon: Magda Debby SAILOR, MD;  Location: [facility] OR;  Service: Vascular;  Laterality: N/A;   CHOLECYSTECTOMY     COLONOSCOPY W/ BIOPSIES AND POLYPECTOMY     DRUG INDUCED ENDOSCOPY Bilateral 07/06/2023   Procedure: DRUG INDUCED SLEEP ENDOSCOPY;  Surgeon: Carlie Clark, MD;  Location: [facility] OR;  Service: ENT;  Laterality: Bilateral;   GIVENS CAPSULE STUDY N/A 03/01/2018   Procedure: GIVENS CAPSULE STUDY;  Surgeon: Albertus Gordy HERO, MD;  Location: [facility] ENDOSCOPY;  Service:  Gastroenterology;  Laterality: N/A;   HERNIA REPAIR     As baby   IMPLANTATION OF HYPOGLOSSAL NERVE STIMULATOR Right 01/14/2024   Procedure: RIGHT IMPLANTATION OF HYPOGLOSSAL NERVE STIMULATOR;  Surgeon: Carlie Clark, MD;  Location: [facility] OR;  Service: ENT;  Laterality: Right;   INSERTION OF DIALYSIS CATHETER Right 01/20/2024   Procedure: INSERTION OF DIALYSIS CATHETER USING 19cm PALINDROME CHRONIC CATHETER INTO THE RIGHT INTERNAL JUGULAR;  Surgeon: Gretta Lonni PARAS, MD;  Location: [facility] OR;  Service: Vascular;  Laterality: Right;   LEFT HEART CATH AND CORONARY ANGIOGRAPHY N/A 09/11/2022   Procedure: LEFT HEART CATH AND CORONARY ANGIOGRAPHY;  Surgeon: Dann Candyce RAMAN, MD;  Location: [facility] INVASIVE CV LAB;  Service: Cardiovascular;  Laterality: N/A;   MITRAL VALVE REPAIR N/A 08/21/2022   Procedure: MINIMALLY INVASIVE MITRAL VALVE REPAIR USING RING ANNULOPLASTY SIMULUS 30mm;  Surgeon: Maryjane Mt, MD;  Location: [facility] OR;  Service: Open Heart Surgery;  Laterality: N/A;   MULTIPLE TOOTH EXTRACTIONS     NEPHRECTOMY Left 02/18/2021   NOSE SURGERY     RENAL BIOPSY     REVISON OF ARTERIOVENOUS FISTULA Left 01/20/2024   Procedure: REVISON OF ARTERIOVENOUS FISTULA WITH ARTERIAL PLICATION;  Surgeon: Gretta Lonni PARAS, MD;  Location: [facility] OR;  Service: Vascular;  Laterality: Left;   RIGHT/LEFT HEART CATH AND CORONARY ANGIOGRAPHY N/A 09/04/2021   Procedure: RIGHT/LEFT HEART CATH AND CORONARY ANGIOGRAPHY;  Surgeon: Verlin Lonni BIRCH, MD;  Location: [facility] INVASIVE CV LAB;  Service: Cardiovascular;  Laterality: N/A;   RIGHT/LEFT HEART CATH AND CORONARY ANGIOGRAPHY N/A 08/01/2022   Procedure: RIGHT/LEFT HEART CATH AND CORONARY ANGIOGRAPHY;  Surgeon: Wonda Sharper, MD;  Location: [facility] INVASIVE CV LAB;  Service: Cardiovascular;  Laterality: N/A;   TEE WITHOUT CARDIOVERSION N/A 09/03/2020   Procedure: TRANSESOPHAGEAL ECHOCARDIOGRAM (TEE);  Surgeon: Okey Vina GAILS, MD;  Location: [facility] ENDOSCOPY;  Service:  Cardiovascular;  Laterality: N/A;   TEE WITHOUT CARDIOVERSION N/A 06/03/2022   Procedure: TRANSESOPHAGEAL ECHOCARDIOGRAM (TEE);  Surgeon: Loni Soyla LABOR, MD;  Location: [facility] ENDOSCOPY;  Service: Cardiology;  Laterality: N/A;   TEE WITHOUT CARDIOVERSION N/A 08/21/2022   Procedure: TRANSESOPHAGEAL ECHOCARDIOGRAM (TEE);  Surgeon: Maryjane Mt, MD;  Location: [facility] OR;  Service: Open Heart Surgery;  Laterality: N/A;   UPPER GI ENDOSCOPY  04/05/2021       Scheduled Meds:  allopurinol   100 mg Oral Daily   bictegravir-emtricitabine -tenofovir  AF  1 tablet Oral Daily   enoxaparin  (LOVENOX ) injection  40 mg Subcutaneous Q24H   fluticasone  furoate-vilanterol  1 puff Inhalation Daily   metoprolol  tartrate  12.5 mg Oral BID   mycophenolate  360 mg Oral TID   predniSONE   5 mg Oral Q breakfast   sodium chloride  flush  3 mL Intravenous Q12H   [START ON 09/02/2024] sulfamethoxazole-trimethoprim  1 tablet Oral Q M,W,F   tacrolimus ER  8 mg Oral Daily   And   [START ON 09/02/2024] tacrolimus ER  1 mg Oral QAC breakfast   Continuous Infusions:  sodium chloride      PRN Meds: sodium chloride , acetaminophen  **OR** acetaminophen , sodium chloride  flush  Allergies:    Allergies  Allergen Reactions   Ace Inhibitors Cough and Other (See Comments)    Social History:   Social History   Socioeconomic History   Marital status: Single    Spouse name: Not on file   Number of children: 0   Years of education: Not on file   Highest education level: Not on file  Occupational History   Not on file  Tobacco Use   Smoking status: Former    Current packs/day: 0.00    Average packs/day: 1 pack/day for 18.0 years (18.0 ttl pk-yrs)    Types: Cigarettes    Start date: 28    Quit date: 2000    Years since quitting: 25.8   Smokeless tobacco: Never  Vaping Use   Vaping status: Never Used  Substance and Sexual Activity   Alcohol use: Yes    Comment: just special occasions/holidays   Drug use: No   Sexual  activity: Not Currently    Partners: Male  Other Topics Concern   Not on file  Social History Narrative   Disability      [place]: none   Social Drivers of Health   Financial Resource Strain: High Risk (07/13/2023)   Overall Financial Resource Strain (CARDIA)    Difficulty of Paying Living Expenses: Hard  Food Insecurity:  No Food Insecurity (09/01/2024)   Hunger Vital Sign    Worried About Running Out of Food in the Last Year: Never true    Ran Out of Food in the Last Year: Never true  Transportation Needs: No Transportation Needs (09/01/2024)   PRAPARE - Administrator, Civil Service (Medical): No    Lack of Transportation (Non-Medical): No  Physical Activity: Inactive (07/13/2023)   Exercise Vital Sign    Days of Exercise per Week: 0 days    Minutes of Exercise per Session: 0 min  Stress: No Stress Concern Present (07/13/2023)   [employer] of Occupational Health - Occupational Stress Questionnaire    Feeling of Stress : Not at all  Social Connections: Socially Isolated (07/13/2023)   Social Connection and Isolation Panel    Frequency of Communication with Friends and Family: More than three times a week    Frequency of Social Gatherings with Friends and Family: More than three times a week    Attends Religious Services: Never    Database Administrator or Organizations: No    Attends Banker Meetings: Never    Marital Status: Never married  Intimate Partner Violence: Not At Risk (09/01/2024)   Humiliation, Afraid, Rape, and Kick questionnaire    Fear of Current or Ex-Partner: No    Emotionally Abused: No    Physically Abused: No    Sexually Abused: No    Family History:   Family History  Problem Relation Age of Onset   Hypertension Mother    Heart failure Mother    Hypertension Father    Alcohol abuse Father    Hypertension Sister    Multiple sclerosis Sister    Hypertension Sister    Hypertension Brother    Heart attack Maternal  Grandmother 89   Scoliosis Other    Allergic rhinitis Neg Hx    Angioedema Neg Hx    Asthma Neg Hx    Eczema Neg Hx    Immunodeficiency Neg Hx    Urticaria Neg Hx    Colon cancer Neg Hx    Pancreatic cancer Neg Hx    Esophageal cancer Neg Hx      ROS:  Please see the history of present illness.  All other ROS reviewed and negative.     Physical Exam/Data: Vitals:   09/01/24 1030 09/01/24 1142 09/01/24 1200 09/01/24 1237  BP: (!) 158/93 (!) 154/102 (!) 171/121 (!) 156/108  Pulse: 88 85 90   Resp: (!) 21 18 20    Temp:  97.7 F (36.5 C) 97.9 F (36.6 C)   TempSrc:  Oral Oral   SpO2: 100% 100% 100%   Weight:   94.1 kg   Height:   5' 10 (1.778 m)     Intake/Output Summary (Last 24 hours) at 09/01/2024 1308 Last data filed at 09/01/2024 0600 Gross per 24 hour  Intake --  Output 1800 ml  Net -1800 ml      09/01/2024   12:00 PM 08/31/2024    4:19 PM 08/31/2024    3:12 PM  Last 3 Weights  Weight (lbs) 207 lb 7.3 oz 205 lb 206 lb 5.6 oz  Weight (kg) 94.1 kg 92.987 kg 93.6 kg     Body mass index is 29.77 kg/m.  General:  Well nourished, well developed, in no acute distress HEENT: normal Neck: JVD Vascular:  Distal pulses 2+ bilaterally Cardiac: RRR; systolic murmur best heard in apex Lungs:  clear to auscultation bilaterally, no  wheezing, rhonchi or rales  Abd: soft, nontender, no hepatomegaly  Ext: no edema Musculoskeletal:  No deformities, BUE and BLE strength normal and equal Skin: warm and dry  Neuro:  CNs 2-12 intact, no focal abnormalities noted Psych:  Normal affect   EKG:  The EKG was personally reviewed and demonstrates:  see hpi Telemetry:  Telemetry was personally reviewed and demonstrates:  sinus rhythm with one PAC HR 85  Relevant CV Studies: Echocardiogram 02/2024 IMPRESSIONS     1. Left ventricular ejection fraction, by estimation, is 50 to 55%. The  left ventricle has low normal function. The left ventricle has no regional  wall motion  abnormalities. The left ventricular internal cavity size was  mildly dilated. There is mild  concentric left ventricular hypertrophy. Left ventricular diastolic  function could not be evaluated.   2. Right ventricular systolic function is normal. The right ventricular  size is normal. There is mildly elevated pulmonary artery systolic  pressure.   3. Left atrial size was severely dilated.   4. The mitral valve is normal in structure. Mild mitral valve  regurgitation. No evidence of mitral stenosis. There is a prosthetic  annuloplasty ring present in the mitral position.   5. Tricuspid valve regurgitation is mild to moderate.   6. The aortic valve is tricuspid. Aortic valve regurgitation is mild. No  aortic stenosis is present.   7. The inferior vena cava is normal in size with greater than 50%  respiratory variability, suggesting right atrial pressure of 3 mmHg.   Myoview  07/2024    Findings are consistent with no ischemia and no infarction. The study is low risk.   No ST deviation was noted. Arrhythmias during stress: occasional PVCs. Arrhythmias during recovery: occasional PVCs.   LV perfusion is normal. There is no evidence of ischemia. There is no evidence of infarction.   Left ventricular function is abnormal. Global function is mildly reduced. Nuclear stress EF: 49%. The left ventricular ejection fraction is mildly decreased (45-54%). End diastolic cavity size is severely enlarged. End systolic cavity size is moderately enlarged. No evidence of transient ischemic dilation (TID) noted.   Prior study available for comparison. No changes compared to prior study.   Laboratory Data: High Sensitivity Troponin:   Recent Labs  Lab 08/31/24 1612 09/01/24 0010  TROPONINIHS 47* 52*     Chemistry Recent Labs  Lab 08/31/24 1612  NA 137  K 4.3  CL 109  CO2 19*  GLUCOSE 122*  BUN 30*  CREATININE 2.44*  CALCIUM  8.9  GFRNONAA 30*  ANIONGAP 9    Hematology Recent Labs  Lab  08/31/24 1612  WBC 7.1  RBC 3.27*  HGB 10.4*  HCT 32.4*  MCV 99.1  MCH 31.8  MCHC 32.1  RDW 18.8*  PLT 205   BNP Recent Labs  Lab 08/31/24 1612  BNP 1,578.9*     Radiology/Studies:  DG Chest 2 View Result Date: 08/31/2024 CLINICAL DATA:  Shortness of breath EXAM: CHEST - 2 VIEW COMPARISON:  06/20/2024 FINDINGS: Frontal and lateral views of the chest demonstrate stable enlargement of the cardiac silhouette. Postsurgical changes from mitral valve repair. Nerve stimulator within the right chest, lead extending cephalad. Increased pulmonary vascular congestion with mild bibasilar interstitial and ground-glass opacities concerning for developing edema. Trace left pleural effusion. No pneumothorax. IMPRESSION: 1. Constellation of findings suggesting mild congestive heart failure and developing bibasilar edema. Electronically Signed   By: Ozell Daring M.D.   On: 08/31/2024 17:39     Assessment  and Plan:  Acute on chronic HFmrEF [45-50% in 2023, 50-55% in 2025] and diastolic heart failure  Demand ischemia Imaging showing evidence of volume overload BNP 1578 Patient had 1.8L of urine charted, though patient had taken PTA lasix  40 mg as well as received IV lasix  40 mg that day. On exam, appears volume up.   Would like to scale diuresis up, though hesitant to be too aggressive with recent renal transplant. Will discuss with MD.  Continue lopressor  12.5 mg twice daily Will defer MRA/ARB/ACE/ARNI/SGLT2i for now 2/2 renal function and recent renal transplant   DOE Patient has had a history of shortness of breath that previously was thought to be multifactorial.  Recent Myoview  to help rule out anginal equivalent. Will continue to treat HF as above, would also benefit from working with PT for deconditioning.  Unsure how much his pulmonary hypertension and OSA is contributing, patient shares unable to sleep due to shortness of breath, I wonder if his inspire may need adjustment as well.  Will pursue if SOB does not improve with diuresis   CAD Chest pain is still ongoing, though atypical. Myoview  as described above ECG without acute changes.  Troponin trend mimics demand ischemia.   Restart ASA 81 Restart Imdur  30 Continue lopressor  as above  Atrial flutter/fibrillation Currently in sinus rhythm Chad Vasc score 3 Warfarin was discontinued at discharge after renal transplant  Continue lopressor  as above Would like nephrology input when anticoagulation can be re-started and if DOAC could be used as the need for urgent reversal is now over [patient received renal transplant]  Hypertension Hypotension with dialysis [s/p renal transplant] BP:156/108 Amlodipine  was discontinued at discharge after renal transplant. Patient has been quite hypertensive this admission. Would benefit from further titration though will wait as we will scale up diuresis and restarting imdur    Hyperlipidemia 07/2024   LDL 44   HDL 45 Start lipitor 40 mg  MR s/p MVR in 2023  Will follow-up on echo  Per primary History of ESRD s/p renal transplant 06/2024 CKD Gout HIV Hep B Asthma Hypothyroidism  Risk Assessment/Risk Scores:       New York  Heart Association (NYHA) Functional Class NYHA Class III  CHA2DS2-VASc Score = 3   This indicates a 3.2% annual risk of stroke. The patient's score is based upon: CHF History: 1 HTN History: 1 Diabetes History: 0 Stroke History: 0 Vascular Disease History: 1 Age Score: 0 Gender Score: 0    For questions or updates, please contact [facility] Please consult [email or web address] for contact info under  Signed, Leontine LOISE Salen, PA-C  09/01/2024 1:08 PM  Patient seen and examined, note reviewed with the signed Advanced Practice Provider. I personally reviewed laboratory data, imaging studies and relevant notes. I independently examined the patient and formulated the important aspects of the plan. I have personally discussed the plan  with the patient and/or family. Comments or changes to the note/plan are indicated below.  Patient seen and examined at his bedside.   Acute on chronic heart failure with midrange ejection fraction Dyspnea on exertion Paroxysmal atrial fibrillation/flutter Hypertensive heart disease ESRD/chronic kidney disease status post renal transplant HIV Hep B Gout Hypothyroidism Mitral valve repair in 2023 due to mitral valve regurgitation   Clinically based on clinical symptoms and physical exam there is some volume overload and I think he would benefit from some diuretics.  I will like to do Lasix  40 mg twice daily and monitor his creatinine cautiously reassess daily for need of  continuation of his Lasix .  He is also hypertensive, he will benefit from optimizing his antihypertensive medication so we will add amlodipine  5 mg daily to the current regimen.  He is on 2.5 at home but I think he will be able to tolerate the 5 mg.  Continue Imdur  30 mg daily.   His paroxysmal atrial fibrillation he is in sinus rhythm he is on Lopressor  12.5 mg twice daily continue this.  He needs to be anticoagulated would appreciate if nephrology can give us  their blessing from restarting his DOAC given this was stopped due to his transplant surgery.  In terms of his kidney transplant nephrology is following the case.  Other noncardiac medication is being managed by the primary team.  We will continue to follow with you   Mattelyn Imhoff DO, MS [facility] Attending Cardiologist [facility]  [address] [place], [place] [address] [phone] Website: [email or web address]

## 2024-09-01 NOTE — Assessment & Plan Note (Signed)
 Continue allopurinol 

## 2024-09-01 NOTE — TOC CM/SW Note (Signed)
 Transition of Care [facility]) - Inpatient Brief Assessment   Patient Details  Name: Albert Eaton MRN: [id number] Date of Birth: [birth year]  Transition of Care [facility]) CM/SW Contact:    Waddell Barnie Rama, RN Phone Number: 09/01/2024, 2:28 PM   Clinical Narrative: From home alone, has PCP and insurance on file, states has no HH services in place at this time , has cpap machine, don't use, neb machine, walkers, don't use, inspire (implant) in place of cpap at home.  States he will transport himself  home at [employer] and family is support system, states gets medications from [facility] on [address].  Pta self ambulatory.   There are no ICM needs identified  at this time.  Please place consult for ICM needs.     Transition of Care Asessment: Insurance and Status: Insurance coverage has been reviewed Patient has primary care physician: Yes Home environment has been reviewed: home alone Prior level of function:: indep Prior/Current Home Services: Current home services (cpap machine, don't use, neb machine, walkers, don't use, inspire (implant) in place of cpap) Social Drivers of Health Review: SDOH reviewed no interventions necessary Readmission risk has been reviewed: Yes Transition of care needs: no transition of care needs at this time

## 2024-09-02 ENCOUNTER — Encounter (HOSPITAL_COMMUNITY)
Admission: EM | Disposition: A | Discharge status: Home / Self Care | Payer: Self-pay | Source: Home / Self Care | Attending: Internal Medicine

## 2024-09-02 DIAGNOSIS — I130 Hypertensive heart and chronic kidney disease with heart failure and stage 1 through stage 4 chronic kidney disease, or unspecified chronic kidney disease: Secondary | ICD-10-CM | POA: Diagnosis present

## 2024-09-02 DIAGNOSIS — I2489 Other forms of acute ischemic heart disease: Secondary | ICD-10-CM | POA: Diagnosis present

## 2024-09-02 DIAGNOSIS — R0603 Acute respiratory distress: Secondary | ICD-10-CM | POA: Diagnosis present

## 2024-09-02 DIAGNOSIS — T8619 Other complication of kidney transplant: Secondary | ICD-10-CM | POA: Diagnosis present

## 2024-09-02 DIAGNOSIS — B20 Human immunodeficiency virus [HIV] disease: Secondary | ICD-10-CM | POA: Diagnosis present

## 2024-09-02 DIAGNOSIS — N184 Chronic kidney disease, stage 4 (severe): Secondary | ICD-10-CM | POA: Diagnosis present

## 2024-09-02 DIAGNOSIS — Z7901 Long term (current) use of anticoagulants: Secondary | ICD-10-CM | POA: Diagnosis not present

## 2024-09-02 DIAGNOSIS — I480 Paroxysmal atrial fibrillation: Secondary | ICD-10-CM | POA: Diagnosis present

## 2024-09-02 DIAGNOSIS — E669 Obesity, unspecified: Secondary | ICD-10-CM | POA: Diagnosis present

## 2024-09-02 DIAGNOSIS — I509 Heart failure, unspecified: Secondary | ICD-10-CM | POA: Diagnosis present

## 2024-09-02 DIAGNOSIS — I5083 High output heart failure: Secondary | ICD-10-CM | POA: Diagnosis present

## 2024-09-02 DIAGNOSIS — N186 End stage renal disease: Secondary | ICD-10-CM | POA: Diagnosis not present

## 2024-09-02 DIAGNOSIS — I10 Essential (primary) hypertension: Secondary | ICD-10-CM | POA: Diagnosis not present

## 2024-09-02 DIAGNOSIS — I700 Atherosclerosis of aorta: Secondary | ICD-10-CM | POA: Diagnosis present

## 2024-09-02 DIAGNOSIS — Y830 Surgical operation with transplant of whole organ as the cause of abnormal reaction of the patient, or of later complication, without mention of misadventure at the time of the procedure: Secondary | ICD-10-CM | POA: Diagnosis present

## 2024-09-02 DIAGNOSIS — I2721 Secondary pulmonary arterial hypertension: Secondary | ICD-10-CM | POA: Diagnosis present

## 2024-09-02 DIAGNOSIS — E785 Hyperlipidemia, unspecified: Secondary | ICD-10-CM | POA: Diagnosis present

## 2024-09-02 DIAGNOSIS — I5023 Acute on chronic systolic (congestive) heart failure: Secondary | ICD-10-CM | POA: Diagnosis not present

## 2024-09-02 DIAGNOSIS — Z87891 Personal history of nicotine dependence: Secondary | ICD-10-CM | POA: Diagnosis not present

## 2024-09-02 DIAGNOSIS — Z952 Presence of prosthetic heart valve: Secondary | ICD-10-CM | POA: Diagnosis not present

## 2024-09-02 DIAGNOSIS — I5033 Acute on chronic diastolic (congestive) heart failure: Secondary | ICD-10-CM | POA: Diagnosis present

## 2024-09-02 DIAGNOSIS — E1122 Type 2 diabetes mellitus with diabetic chronic kidney disease: Secondary | ICD-10-CM | POA: Diagnosis present

## 2024-09-02 DIAGNOSIS — T8611 Kidney transplant rejection: Secondary | ICD-10-CM | POA: Diagnosis present

## 2024-09-02 DIAGNOSIS — I132 Hypertensive heart and chronic kidney disease with heart failure and with stage 5 chronic kidney disease, or end stage renal disease: Secondary | ICD-10-CM | POA: Diagnosis not present

## 2024-09-02 DIAGNOSIS — D631 Anemia in chronic kidney disease: Secondary | ICD-10-CM | POA: Diagnosis present

## 2024-09-02 DIAGNOSIS — N2581 Secondary hyperparathyroidism of renal origin: Secondary | ICD-10-CM | POA: Diagnosis present

## 2024-09-02 DIAGNOSIS — T82590A Other mechanical complication of surgically created arteriovenous fistula, initial encounter: Secondary | ICD-10-CM | POA: Diagnosis not present

## 2024-09-02 DIAGNOSIS — E039 Hypothyroidism, unspecified: Secondary | ICD-10-CM | POA: Diagnosis present

## 2024-09-02 DIAGNOSIS — E875 Hyperkalemia: Secondary | ICD-10-CM | POA: Diagnosis present

## 2024-09-02 DIAGNOSIS — I4892 Unspecified atrial flutter: Secondary | ICD-10-CM | POA: Diagnosis present

## 2024-09-02 DIAGNOSIS — D84821 Immunodeficiency due to drugs: Secondary | ICD-10-CM | POA: Diagnosis present

## 2024-09-02 HISTORY — PX: RIGHT HEART CATH: CATH118263

## 2024-09-02 LAB — POCT I-STAT EG7
Acid-base deficit: 5 mmol/L — ABNORMAL HIGH (ref 0.0–2.0)
Acid-base deficit: 5 mmol/L — ABNORMAL HIGH (ref 0.0–2.0)
Bicarbonate: 19.4 mmol/L — ABNORMAL LOW (ref 20.0–28.0)
Bicarbonate: 19 mmol/L — ABNORMAL LOW (ref 20.0–28.0)
Calcium, Ion: 1.14 mmol/L — ABNORMAL LOW (ref 1.15–1.40)
Calcium, Ion: 1.16 mmol/L (ref 1.15–1.40)
HCT: 31 % — ABNORMAL LOW (ref 39.0–52.0)
HCT: 31 % — ABNORMAL LOW (ref 39.0–52.0)
Hemoglobin: 10.5 g/dL — ABNORMAL LOW (ref 13.0–17.0)
Hemoglobin: 10.5 g/dL — ABNORMAL LOW (ref 13.0–17.0)
O2 Saturation: 72 %
O2 Saturation: 82 %
Potassium: 3.9 mmol/L (ref 3.5–5.1)
Potassium: 4 mmol/L (ref 3.5–5.1)
Sodium: 137 mmol/L (ref 135–145)
Sodium: 137 mmol/L (ref 135–145)
TCO2: 20 mmol/L — ABNORMAL LOW (ref 22–32)
TCO2: 20 mmol/L — ABNORMAL LOW (ref 22–32)
pCO2, Ven: 34.4 mmHg — ABNORMAL LOW (ref 44–60)
pCO2, Ven: 32.4 mmHg — ABNORMAL LOW (ref 44–60)
pH, Ven: 7.359 (ref 7.25–7.43)
pH, Ven: 7.377 (ref 7.25–7.43)
pO2, Ven: 39 mmHg (ref 32–45)
pO2, Ven: 47 mmHg — ABNORMAL HIGH (ref 32–45)

## 2024-09-02 LAB — CBC
HCT: 31.5 % — ABNORMAL LOW (ref 39.0–52.0)
Hemoglobin: 10.5 g/dL — ABNORMAL LOW (ref 13.0–17.0)
MCH: 32.2 pg (ref 26.0–34.0)
MCHC: 33.3 g/dL (ref 30.0–36.0)
MCV: 96.6 fL (ref 80.0–100.0)
Platelets: 190 K/uL (ref 150–400)
RBC: 3.26 MIL/uL — ABNORMAL LOW (ref 4.22–5.81)
RDW: 18.3 % — ABNORMAL HIGH (ref 11.5–15.5)
WBC: 6.5 K/uL (ref 4.0–10.5)
nRBC: 0 % (ref 0.0–0.2)

## 2024-09-02 LAB — COMPREHENSIVE METABOLIC PANEL WITH GFR
ALT: 59 U/L — ABNORMAL HIGH (ref 0–44)
AST: 33 U/L (ref 15–41)
Albumin: 3.6 g/dL (ref 3.5–5.0)
Alkaline Phosphatase: 93 U/L (ref 38–126)
Anion gap: 13 (ref 5–15)
BUN: 36 mg/dL — ABNORMAL HIGH (ref 6–20)
CO2: 20 mmol/L — ABNORMAL LOW (ref 22–32)
Calcium: 8.6 mg/dL — ABNORMAL LOW (ref 8.9–10.3)
Chloride: 101 mmol/L (ref 98–111)
Creatinine, Ser: 2.77 mg/dL — ABNORMAL HIGH (ref 0.61–1.24)
GFR, Estimated: 26 mL/min — ABNORMAL LOW (ref >60)
Glucose, Bld: 119 mg/dL — ABNORMAL HIGH (ref 70–99)
Potassium: 3.8 mmol/L (ref 3.5–5.1)
Sodium: 134 mmol/L — ABNORMAL LOW (ref 135–145)
Total Bilirubin: 0.8 mg/dL (ref 0.0–1.2)
Total Protein: 5.9 g/dL — ABNORMAL LOW (ref 6.5–8.1)

## 2024-09-02 LAB — PHOSPHORUS: Phosphorus: 1.9 mg/dL — ABNORMAL LOW (ref 2.5–4.6)

## 2024-09-02 LAB — T-HELPER CELLS (CD4) COUNT (NOT AT ARMC)
CD4 % Helper T Cell: 30 % — ABNORMAL LOW (ref 33–65)
CD4 T Cell Abs: 151 /uL — ABNORMAL LOW (ref 400–1790)

## 2024-09-02 SURGERY — RIGHT HEART CATH

## 2024-09-02 MED ORDER — SODIUM CHLORIDE 0.9% FLUSH
3.0000 mL | Freq: Two times a day (BID) | INTRAVENOUS | Status: DC
  Administered 2024-09-03 – 2024-09-07 (×5): 3 mL via INTRAVENOUS

## 2024-09-02 MED ORDER — MIDAZOLAM HCL 2 MG/2ML IJ SOLN
INTRAMUSCULAR | Status: AC
  Filled 2024-09-02: qty 2

## 2024-09-02 MED ORDER — HYDRALAZINE HCL 20 MG/ML IJ SOLN: 10.0000 mg | INTRAMUSCULAR | Status: AC | PRN

## 2024-09-02 MED ORDER — ISOSORBIDE MONONITRATE ER 30 MG PO TB24
15.0000 mg | ORAL_TABLET | Freq: Every day | ORAL | Status: DC
  Administered 2024-09-03 – 2024-09-09 (×7): 15 mg via ORAL
  Filled 2024-09-02 (×6): qty 1

## 2024-09-02 MED ORDER — LABETALOL HCL 5 MG/ML IV SOLN
10.0000 mg | INTRAVENOUS | Status: AC | PRN
  Filled 2024-09-02: qty 4

## 2024-09-02 MED ORDER — VALGANCICLOVIR HCL 450 MG PO TABS
450.0000 mg | ORAL_TABLET | ORAL | Status: DC
  Administered 2024-09-02 – 2024-09-08 (×4): 450 mg via ORAL
  Filled 2024-09-02 (×4): qty 1

## 2024-09-02 MED ORDER — SODIUM CHLORIDE 0.9% FLUSH: 3.0000 mL | INTRAVENOUS | Status: DC | PRN

## 2024-09-02 MED ORDER — HEPARIN (PORCINE) IN NACL 1000-0.9 UT/500ML-% IV SOLN
INTRAVENOUS | Status: DC | PRN
  Administered 2024-09-02: 500 mL

## 2024-09-02 MED ORDER — LIDOCAINE HCL (PF) 1 % IJ SOLN
INTRAMUSCULAR | Status: AC
  Filled 2024-09-02: qty 30

## 2024-09-02 MED ORDER — SODIUM CHLORIDE 0.9% FLUSH
3.0000 mL | Freq: Two times a day (BID) | INTRAVENOUS | Status: DC
  Administered 2024-09-02 – 2024-09-09 (×12): 3 mL via INTRAVENOUS

## 2024-09-02 MED ORDER — SODIUM CHLORIDE 0.9 % IV SOLN: 250.0000 mL | INTRAVENOUS | Status: AC | PRN

## 2024-09-02 MED ORDER — VERAPAMIL HCL 2.5 MG/ML IV SOLN
INTRAVENOUS | Status: AC
  Filled 2024-09-02: qty 2

## 2024-09-02 MED ORDER — SODIUM PHOSPHATES 45 MMOLE/15ML IV SOLN
15.0000 mmol | Freq: Once | INTRAVENOUS | Status: AC
  Administered 2024-09-02: 15 mmol via INTRAVENOUS
  Filled 2024-09-02: qty 5

## 2024-09-02 MED ORDER — FENTANYL CITRATE (PF) 100 MCG/2ML IJ SOLN
INTRAMUSCULAR | Status: DC | PRN
  Administered 2024-09-02: 25 ug via INTRAVENOUS

## 2024-09-02 MED ORDER — LIDOCAINE HCL (PF) 1 % IJ SOLN
INTRAMUSCULAR | Status: DC | PRN
  Administered 2024-09-02: 2 mL via INTRADERMAL
  Administered 2024-09-02: 5 mL via INTRADERMAL

## 2024-09-02 MED ORDER — ONDANSETRON HCL 4 MG/2ML IJ SOLN
4.0000 mg | Freq: Four times a day (QID) | INTRAMUSCULAR | Status: DC | PRN
Start: 2024-09-02 — End: 2024-09-09

## 2024-09-02 MED ORDER — MIDAZOLAM HCL (PF) 2 MG/2ML IJ SOLN
INTRAMUSCULAR | Status: DC | PRN
  Administered 2024-09-02: 1 mg via INTRAVENOUS

## 2024-09-02 MED ORDER — ACETAMINOPHEN 325 MG PO TABS: 650.0000 mg | ORAL_TABLET | ORAL | Status: DC | PRN

## 2024-09-02 MED ORDER — LEVOTHYROXINE SODIUM 25 MCG PO TABS
25.0000 ug | ORAL_TABLET | Freq: Every day | ORAL | Status: DC
  Administered 2024-09-02 – 2024-09-09 (×6): 25 ug via ORAL
  Filled 2024-09-02 (×7): qty 1

## 2024-09-02 MED ORDER — FENTANYL CITRATE (PF) 100 MCG/2ML IJ SOLN
INTRAMUSCULAR | Status: AC
  Filled 2024-09-02: qty 2

## 2024-09-02 MED ORDER — HEPARIN SODIUM (PORCINE) 1000 UNIT/ML IJ SOLN
INTRAMUSCULAR | Status: AC
Start: 2024-09-02 — End: 2024-09-02
  Filled 2024-09-02: qty 10

## 2024-09-02 SURGICAL SUPPLY — 9 items
CATH BALLN WEDGE 5F 110CM (CATHETERS) IMPLANT
GUIDEWIRE .025 260CM (WIRE) IMPLANT
PACK CARDIAC CATHETERIZATION (CUSTOM PROCEDURE TRAY) IMPLANT
SHEATH GLIDE SLENDER 4/5FR (SHEATH) IMPLANT
SHEATH PINNACLE 5F 10CM (SHEATH) IMPLANT
SHEATH PROBE COVER 6X72 (BAG) IMPLANT
TRANSDUCER W/STOPCOCK (MISCELLANEOUS) IMPLANT
TUBING ART PRESS 72 MALE/FEM (TUBING) IMPLANT
WIRE MICRO SET SILHO 5FR 7 (SHEATH) IMPLANT

## 2024-09-02 NOTE — TOC CM/SW Note (Signed)
 Transition of Care (TOC) CM/SW Note   CSW addressed patients SDOH needs and provided resources on patients AVS.   Luise Pan, MSW, LCSWA Transitions of Care [phone]

## 2024-09-02 NOTE — Evaluation (Signed)
 Physical Therapy Brief Evaluation and Discharge Note Patient Details Name: Albert Eaton MRN: [id number] DOB: Mar 12, 1966 Today's Date: 09/02/2024   History of Present Illness  58 yo M adm 08/31/24 with CHF exacerbation. PMhx: renal transplant Sept 2025, HfrEF, MVR, ESRD, pulmonary HTN, HIV, RCC s/p lt nephrectomy, CVA, neuropathy, PAF, HTN, OSA, gout, hypothyroidism, HLD, IDA  Clinical Impression  PT pleasant, lives at home alone but frequently goes to nephew's house to eat. Pt states he does not weigh daily and has not been very active the last few months. [place] is able to perform transfers and gait on RA without assist, SPO2 >95% and DOE 2/4. Pt educated for daily weights and setting calendar reminder, progressive walking program and low sodium diet. Pt verbalized understanding and no further acute needs. Will defer to mobility.       PT Assessment Patient does not need any further PT services  Assistance Needed at Discharge  None    Equipment Recommendations None recommended by PT  Recommendations for Other Services       Precautions/Restrictions Precautions Precautions: None        Mobility  Bed Mobility   Supine/Sidelying to sit: Modified independent (Device/Increased time) Sit to supine/sidelying: Modified independent (Device/Increased time)    Transfers Overall transfer level: Independent                      Ambulation/Gait Ambulation/Gait assistance: Independent Gait Distance (Feet): 400 Feet Assistive device: None Gait Pattern/deviations: WFL(Within Functional Limits) Gait Speed: Pace WFL    Home Activity Instructions    Stairs            Modified Rankin (Stroke Patients Only)        Balance Overall balance assessment: No apparent balance deficits (not formally assessed)                        Pertinent Vitals/Pain PT - Brief Vital Signs All Vital Signs Stable: Yes (SPO2 >96% on RA) Pain Assessment Pain Assessment:  No/denies pain     Home Living Family/patient expects to be discharged to:: Private residence Living Arrangements: Alone Available Help at Discharge: Family;Available PRN/intermittently Home Environment: Level entry   Home Equipment: Rollator (4 wheels)   Additional Comments: has parking garage on lower level, elevator up to his first floor apartment from there. reports his unit is 2 doors down from elevator    Prior Function Level of Independence: Independent      UE/LE Assessment   UE ROM/Strength/Tone/Coordination: WFL    LE ROM/Strength/Tone/Coordination: [facility]      Communication   Communication Communication: No apparent difficulties     Cognition Overall Cognitive Status: Appears within functional limits for tasks assessed/performed       General Comments      Exercises     Assessment/Plan    PT Problem List         PT Visit Diagnosis Other abnormalities of gait and mobility (R26.89)    No Skilled PT All education completed;Patient at baseline level of functioning   Co-evaluation                AMPAC 6 Clicks Help needed turning from your back to your side while in a flat bed without using bedrails?: None Help needed moving from lying on your back to sitting on the side of a flat bed without using bedrails?: None Help needed moving to and from a bed to a chair (including a  wheelchair)?: None Help needed standing up from a chair using your arms (e.g., wheelchair or bedside chair)?: None Help needed to walk in hospital room?: None Help needed climbing 3-5 steps with a railing? : None 6 Click Score: 24      End of Session   Activity Tolerance: Patient tolerated treatment well Patient left: in bed;with call bell/phone within reach Nurse Communication: Mobility status PT Visit Diagnosis: Other abnormalities of gait and mobility (R26.89)     Time: 8885-8870 PT Time Calculation (min) (ACUTE ONLY): 15 min  Charges:   PT Evaluation $PT  Eval Low Complexity: 1 Low      Kirstin Kugler P, PT Acute Rehabilitation Services Office: [phone]   Lenoard NOVAK Devario Bucklew  09/02/2024, 1:04 PM

## 2024-09-02 NOTE — Progress Notes (Signed)
 Progress Note  Patient Name: Albert Eaton Date of Encounter: 09/02/2024  Primary Cardiologist: [name], MD   Subjective   Patient seen and examined at his bedside. Some clinical improvement with symptoms.   Inpatient Medications    Scheduled Meds:  allopurinol   100 mg Oral Daily   amLODipine   5 mg Oral Daily   aspirin  EC  81 mg Oral Daily   atorvastatin   40 mg Oral Daily   bictegravir-emtricitabine -tenofovir  AF  1 tablet Oral Daily   cholecalciferol  400 Units Oral Daily   cinacalcet   30 mg Oral Q supper   enoxaparin  (LOVENOX ) injection  40 mg Subcutaneous Q24H   fluticasone  furoate-vilanterol  1 puff Inhalation Daily   furosemide   40 mg Intravenous BID   isosorbide  mononitrate  30 mg Oral Daily   metoprolol  tartrate  12.5 mg Oral BID   mycophenolate  360 mg Oral TID   pantoprazole   40 mg Oral Daily   predniSONE   5 mg Oral Q breakfast   rOPINIRole   1 mg Oral QHS   sodium chloride  flush  3 mL Intravenous Q12H   sulfamethoxazole-trimethoprim  1 tablet Oral Q M,W,F   tacrolimus ER  8 mg Oral Daily   And   tacrolimus ER  1 mg Oral QAC breakfast   Continuous Infusions:  sodium chloride      PRN Meds: sodium chloride , acetaminophen  **OR** acetaminophen , hydrALAZINE , ipratropium-albuterol , sodium chloride  flush   Vital Signs    Vitals:   09/02/24 0052 09/02/24 0322 09/02/24 0323 09/02/24 0526  BP: 131/87  130/88   Pulse:  74 74   Resp:   17   Temp:  97.6 F (36.4 C) 97.6 F (36.4 C)   TempSrc:   Oral   SpO2:  100% 100%   Weight:    94.1 kg  Height:        Intake/Output Summary (Last 24 hours) at 09/02/2024 0757 Last data filed at 09/01/2024 2352 Gross per 24 hour  Intake 236 ml  Output 1700 ml  Net -1464 ml   Filed Weights   08/31/24 1619 09/01/24 1200 09/02/24 0526  Weight: 93 kg 94.1 kg 94.1 kg    Telemetry     - Personally Reviewed  ECG     - Personally Reviewed  Physical Exam    General: Comfortable, Head: Atraumatic, normal size   Eyes: PEERLA, EOMI  Neck: Supple, normal JVD Cardiac: Normal S1, S2; RRR; no murmurs, rubs, or gallops Lungs: Clear to auscultation bilaterally Abd: Soft, nontender, no hepatomegaly  Ext: warm, no edema Musculoskeletal: No deformities, BUE and BLE strength normal and equal Skin: Warm and dry, no rashes   Neuro: Alert and oriented to person, place, time, and situation, CNII-XII grossly intact, no focal deficits  Psych: Normal mood and affect   Labs    Chemistry Recent Labs  Lab 08/31/24 1612 09/02/24 0257  NA 137 134*  K 4.3 3.8  CL 109 101  CO2 19* 20*  GLUCOSE 122* 119*  BUN 30* 36*  CREATININE 2.44* 2.77*  CALCIUM  8.9 8.6*  PROT  --  5.9*  ALBUMIN   --  3.6  AST  --  33  ALT  --  59*  ALKPHOS  --  93  BILITOT  --  0.8  GFRNONAA 30* 26*  ANIONGAP 9 13     Hematology Recent Labs  Lab 08/31/24 1612 09/02/24 0257  WBC 7.1 6.5  RBC 3.27* 3.26*  HGB 10.4* 10.5*  HCT 32.4* 31.5*  MCV  99.1 96.6  MCH 31.8 32.2  MCHC 32.1 33.3  RDW 18.8* 18.3*  PLT 205 190    Cardiac EnzymesNo results for input(s): TROPONINI in the last 168 hours. No results for input(s): TROPIPOC in the last 168 hours.   BNP Recent Labs  Lab 08/31/24 1612  BNP 1,578.9*     DDimer No results for input(s): DDIMER in the last 168 hours.   Radiology    ECHOCARDIOGRAM COMPLETE Result Date: 09/01/2024    ECHOCARDIOGRAM REPORT   Patient Name:   Albert Eaton Date of Exam: 09/01/2024 Medical Rec #:  [id number]       Height:       70.0 in Accession #:    [id number]      Weight:       205.0 lb Date of Birth:  01-06-1966        BSA:          2.109 m Patient Age:    58 years        BP:           166/89 mmHg Patient Gender: M               HR:           88 bpm. Exam Location:  Inpatient Procedure: 2D Echo, Cardiac Doppler and Color Doppler (Both Spectral and Color            Flow Doppler were utilized during procedure). Indications:    Congestive Heart Failure I50.9  History:        Patient has no  prior history of Echocardiogram examinations,                 most recent 03/03/2024.                  Mitral Valve: bioprosthetic valve valve is present in the mitral                 position.  Sonographer:    Jayson Gaskins Referring Phys: STEVEN J NEWTON IMPRESSIONS  1. Left ventricular ejection fraction, by estimation, is 50 to 55%. The left ventricle has low normal function. The left ventricle has no regional wall motion abnormalities. The left ventricular internal cavity size was mildly dilated. There is moderate  concentric left ventricular hypertrophy. Left ventricular diastolic parameters are indeterminate.  2. Right ventricular systolic function is normal. The right ventricular size is moderately enlarged. There is moderately elevated pulmonary artery systolic pressure. The estimated right ventricular systolic pressure is 47.3 mmHg.  3. Left atrial size was severely dilated.  4. Right atrial size was severely dilated.  5. The mitral valve has been repaired/replaced. Mild mitral valve regurgitation. No evidence of mitral stenosis. There is a bioprosthetic valve present in the mitral position.  6. The tricuspid valve is degenerative. Tricuspid valve regurgitation is severe.  7. The aortic valve has an indeterminant number of cusps. There is mild calcification of the aortic valve. Aortic valve regurgitation is trivial. Aortic valve sclerosis/calcification is present, without any evidence of aortic stenosis.  8. There is mild dilatation of the ascending aorta, measuring 38 mm.  9. The inferior vena cava is dilated in size with <50% respiratory variability, suggesting right atrial pressure of 15 mmHg. FINDINGS  Left Ventricle: Left ventricular ejection fraction, by estimation, is 50 to 55%. The left ventricle has low normal function. The left ventricle has no regional wall motion abnormalities. The left ventricular internal cavity size was mildly dilated.  There is moderate concentric left ventricular hypertrophy.  Left ventricular diastolic parameters are indeterminate. Right Ventricle: The right ventricular size is moderately enlarged. No increase in right ventricular wall thickness. Right ventricular systolic function is normal. There is moderately elevated pulmonary artery systolic pressure. The tricuspid regurgitant  velocity is 2.84 m/s, and with an assumed right atrial pressure of 15 mmHg, the estimated right ventricular systolic pressure is 47.3 mmHg. Left Atrium: Left atrial size was severely dilated. Right Atrium: Right atrial size was severely dilated. Pericardium: There is no evidence of pericardial effusion. Presence of epicardial fat layer. Mitral Valve: The mitral valve has been repaired/replaced. Mild mitral valve regurgitation. There is a bioprosthetic valve present in the mitral position. No evidence of mitral valve stenosis. MV peak gradient, 10.4 mmHg. The mean mitral valve gradient is 3.0 mmHg. Tricuspid Valve: The tricuspid valve is degenerative in appearance. Tricuspid valve regurgitation is severe. Aortic Valve: The aortic valve has an indeterminant number of cusps. There is mild calcification of the aortic valve. Aortic valve regurgitation is trivial. Aortic valve sclerosis/calcification is present, without any evidence of aortic stenosis. Aortic valve mean gradient measures 4.0 mmHg. Aortic valve peak gradient measures 4.8 mmHg. Aortic valve area, by VTI measures 4.02 cm. Pulmonic Valve: The pulmonic valve was grossly normal. Pulmonic valve regurgitation is mild. Aorta: The aortic root is normal in size and structure. There is mild dilatation of the ascending aorta, measuring 38 mm. Venous: The inferior vena cava is dilated in size with less than 50% respiratory variability, suggesting right atrial pressure of 15 mmHg. IAS/Shunts: No atrial level shunt detected by color flow Doppler.  LEFT VENTRICLE PLAX 2D LVIDd:         5.70 cm   Diastology LVIDs:         4.25 cm   LV e' medial:    9.03 cm/s LV PW:          1.40 cm   LV E/e' medial:  16.4 LV IVS:        1.60 cm   LV e' lateral:   10.60 cm/s LVOT diam:     2.00 cm   LV E/e' lateral: 14.0 LV SV:         74 LV SV Index:   35 LVOT Area:     3.14 cm  RIGHT VENTRICLE RV S prime:     9.68 cm/s LEFT ATRIUM              Index        RIGHT ATRIUM           Index LA Vol (A2C):   94.4 ml  44.75 ml/m  RA Area:     34.20 cm LA Vol (A4C):   111.0 ml 52.62 ml/m  RA Volume:   130.00 ml 61.63 ml/m LA Biplane Vol: 105.0 ml 49.78 ml/m  AORTIC VALVE AV Area (Vmax):    3.37 cm AV Area (Vmean):   3.19 cm AV Area (VTI):     4.02 cm AV Vmax:           110.00 cm/s AV Vmean:          92.400 cm/s AV VTI:            0.185 m AV Peak Grad:      4.8 mmHg AV Mean Grad:      4.0 mmHg LVOT Vmax:         118.00 cm/s LVOT Vmean:        93.900  cm/s LVOT VTI:          0.237 m LVOT/AV VTI ratio: 1.28  AORTA Ao Root diam: 3.50 cm MITRAL VALVE                TRICUSPID VALVE MV Area (PHT): 3.37 cm     TR Peak grad:   32.3 mmHg MV Area VTI:   2.85 cm     TR Vmax:        284.00 cm/s MV Peak grad:  10.4 mmHg MV Mean grad:  3.0 mmHg     SHUNTS MV Vmax:       1.61 m/s     Systemic VTI:  0.24 m MV Vmean:      81.1 cm/s    Systemic Diam: 2.00 cm MV Decel Time: 225 msec MV E velocity: 148.00 cm/s MV A velocity: 85.90 cm/s MV E/A ratio:  1.72 Toribio Fuel MD Electronically signed by Toribio Fuel MD Signature Date/Time: 09/01/2024/11:45:33 PM    Final    DG Chest 2 View Result Date: 08/31/2024 CLINICAL DATA:  Shortness of breath EXAM: CHEST - 2 VIEW COMPARISON:  06/20/2024 FINDINGS: Frontal and lateral views of the chest demonstrate stable enlargement of the cardiac silhouette. Postsurgical changes from mitral valve repair. Nerve stimulator within the right chest, lead extending cephalad. Increased pulmonary vascular congestion with mild bibasilar interstitial and ground-glass opacities concerning for developing edema. Trace left pleural effusion. No pneumothorax. IMPRESSION: 1. Constellation  of findings suggesting mild congestive heart failure and developing bibasilar edema. Electronically Signed   By: Ozell Daring M.D.   On: 08/31/2024 17:39    Cardiac Studies   Echo   Patient Profile     58 y.o. male   Assessment & Plan    Acute on chronic heart failure with improved ejection fraction Elevated troponin Dyspnea on exertion  CAD Paroxysmal atrial fibrillation  Hypertension  ESRD, now status post renal transplant  HIV  He has had some improvement with the Lasix  40 BID, will continue for now. Concern for high output failure in the setting of AVF - Will plan for the RHC hopefully today.   Blood pressure has improved, he was started on Amlodipine  5 mg daily. Continue Amlodipine  and Imdur .   We have consented him for the Right heart cath. Informed Consent   Shared Decision Making/Informed Consent The risks, including but not limited to, [bleeding or vascular complications (1 in 500), pneumothorax (1 in 1600), arrhythmia (1 in 1000) and death (1 in 5000)], benefits (diagnostic support and/or management of heart failure, pulmonary hypertension) and alternatives of a right heart catheterization were discussed in detail with Mr. Thoennes and he is willing to proceed.      His paroxysmal atrial fibrillation he is in sinus rhythm he is on Lopressor  12.5 mg twice daily continue this.  He needs to be anticoagulated would appreciate if nephrology can give us  their blessing from restarting his DOAC given this was stopped due to his transplant surgery.   In terms of his kidney transplant nephrology is following the case.   Other noncardiac medication is being managed by the primary team.   We will continue to follow with you   For questions or updates, please contact [facility] Please consult [email or web address] for contact info under Cardiology/STEMI.      Signed, Londell Noll, DO  09/02/2024, 7:57 AM

## 2024-09-02 NOTE — Plan of Care (Signed)

## 2024-09-02 NOTE — Interval H&P Note (Signed)
 History and Physical Interval Note:  09/02/2024 4:16 PM  Albert Eaton  has presented today for surgery, with the diagnosis of hp.  The various methods of treatment have been discussed with the patient and family. After consideration of risks, benefits and other options for treatment, the patient has consented to  Procedure(s): RIGHT HEART CATH (N/A) as a surgical intervention.  The patient's history has been reviewed, patient examined, no change in status, stable for surgery.  I have reviewed the patient's chart and labs.  Questions were answered to the patient's satisfaction.     Rogue Rafalski K Kyi Romanello

## 2024-09-02 NOTE — H&P (View-Only) (Signed)
 Progress Note  Patient Name: Albert Eaton Date of Encounter: 09/02/2024  Primary Cardiologist: [name], MD   Subjective   Patient seen and examined at his bedside. Some clinical improvement with symptoms.   Inpatient Medications    Scheduled Meds:  allopurinol   100 mg Oral Daily   amLODipine   5 mg Oral Daily   aspirin  EC  81 mg Oral Daily   atorvastatin   40 mg Oral Daily   bictegravir-emtricitabine -tenofovir  AF  1 tablet Oral Daily   cholecalciferol  400 Units Oral Daily   cinacalcet   30 mg Oral Q supper   enoxaparin  (LOVENOX ) injection  40 mg Subcutaneous Q24H   fluticasone  furoate-vilanterol  1 puff Inhalation Daily   furosemide   40 mg Intravenous BID   isosorbide  mononitrate  30 mg Oral Daily   metoprolol  tartrate  12.5 mg Oral BID   mycophenolate  360 mg Oral TID   pantoprazole   40 mg Oral Daily   predniSONE   5 mg Oral Q breakfast   rOPINIRole   1 mg Oral QHS   sodium chloride  flush  3 mL Intravenous Q12H   sulfamethoxazole-trimethoprim  1 tablet Oral Q M,W,F   tacrolimus ER  8 mg Oral Daily   And   tacrolimus ER  1 mg Oral QAC breakfast   Continuous Infusions:  sodium chloride      PRN Meds: sodium chloride , acetaminophen  **OR** acetaminophen , hydrALAZINE , ipratropium-albuterol , sodium chloride  flush   Vital Signs    Vitals:   09/02/24 0052 09/02/24 0322 09/02/24 0323 09/02/24 0526  BP: 131/87  130/88   Pulse:  74 74   Resp:   17   Temp:  97.6 F (36.4 C) 97.6 F (36.4 C)   TempSrc:   Oral   SpO2:  100% 100%   Weight:    94.1 kg  Height:        Intake/Output Summary (Last 24 hours) at 09/02/2024 0757 Last data filed at 09/01/2024 2352 Gross per 24 hour  Intake 236 ml  Output 1700 ml  Net -1464 ml   Filed Weights   08/31/24 1619 09/01/24 1200 09/02/24 0526  Weight: 93 kg 94.1 kg 94.1 kg    Telemetry     - Personally Reviewed  ECG     - Personally Reviewed  Physical Exam    General: Comfortable, Head: Atraumatic, normal size   Eyes: PEERLA, EOMI  Neck: Supple, normal JVD Cardiac: Normal S1, S2; RRR; no murmurs, rubs, or gallops Lungs: Clear to auscultation bilaterally Abd: Soft, nontender, no hepatomegaly  Ext: warm, no edema Musculoskeletal: No deformities, BUE and BLE strength normal and equal Skin: Warm and dry, no rashes   Neuro: Alert and oriented to person, place, time, and situation, CNII-XII grossly intact, no focal deficits  Psych: Normal mood and affect   Labs    Chemistry Recent Labs  Lab 08/31/24 1612 09/02/24 0257  NA 137 134*  K 4.3 3.8  CL 109 101  CO2 19* 20*  GLUCOSE 122* 119*  BUN 30* 36*  CREATININE 2.44* 2.77*  CALCIUM  8.9 8.6*  PROT  --  5.9*  ALBUMIN   --  3.6  AST  --  33  ALT  --  59*  ALKPHOS  --  93  BILITOT  --  0.8  GFRNONAA 30* 26*  ANIONGAP 9 13     Hematology Recent Labs  Lab 08/31/24 1612 09/02/24 0257  WBC 7.1 6.5  RBC 3.27* 3.26*  HGB 10.4* 10.5*  HCT 32.4* 31.5*  MCV  99.1 96.6  MCH 31.8 32.2  MCHC 32.1 33.3  RDW 18.8* 18.3*  PLT 205 190    Cardiac EnzymesNo results for input(s): TROPONINI in the last 168 hours. No results for input(s): TROPIPOC in the last 168 hours.   BNP Recent Labs  Lab 08/31/24 1612  BNP 1,578.9*     DDimer No results for input(s): DDIMER in the last 168 hours.   Radiology    ECHOCARDIOGRAM COMPLETE Result Date: 09/01/2024    ECHOCARDIOGRAM REPORT   Patient Name:   [place] Festa Amato Date of Exam: 09/01/2024 Medical Rec #:  [id number]       Height:       70.0 in Accession #:    [id number]      Weight:       205.0 lb Date of Birth:  01-06-1966        BSA:          2.109 m Patient Age:    58 years        BP:           166/89 mmHg Patient Gender: M               HR:           88 bpm. Exam Location:  Inpatient Procedure: 2D Echo, Cardiac Doppler and Color Doppler (Both Spectral and Color            Flow Doppler were utilized during procedure). Indications:    Congestive Heart Failure I50.9  History:        Patient has no  prior history of Echocardiogram examinations,                 most recent 03/03/2024.                  Mitral Valve: bioprosthetic valve valve is present in the mitral                 position.  Sonographer:    Jayson Gaskins Referring Phys: STEVEN J NEWTON IMPRESSIONS  1. Left ventricular ejection fraction, by estimation, is 50 to 55%. The left ventricle has low normal function. The left ventricle has no regional wall motion abnormalities. The left ventricular internal cavity size was mildly dilated. There is moderate  concentric left ventricular hypertrophy. Left ventricular diastolic parameters are indeterminate.  2. Right ventricular systolic function is normal. The right ventricular size is moderately enlarged. There is moderately elevated pulmonary artery systolic pressure. The estimated right ventricular systolic pressure is 47.3 mmHg.  3. Left atrial size was severely dilated.  4. Right atrial size was severely dilated.  5. The mitral valve has been repaired/replaced. Mild mitral valve regurgitation. No evidence of mitral stenosis. There is a bioprosthetic valve present in the mitral position.  6. The tricuspid valve is degenerative. Tricuspid valve regurgitation is severe.  7. The aortic valve has an indeterminant number of cusps. There is mild calcification of the aortic valve. Aortic valve regurgitation is trivial. Aortic valve sclerosis/calcification is present, without any evidence of aortic stenosis.  8. There is mild dilatation of the ascending aorta, measuring 38 mm.  9. The inferior vena cava is dilated in size with <50% respiratory variability, suggesting right atrial pressure of 15 mmHg. FINDINGS  Left Ventricle: Left ventricular ejection fraction, by estimation, is 50 to 55%. The left ventricle has low normal function. The left ventricle has no regional wall motion abnormalities. The left ventricular internal cavity size was mildly dilated.  There is moderate concentric left ventricular hypertrophy.  Left ventricular diastolic parameters are indeterminate. Right Ventricle: The right ventricular size is moderately enlarged. No increase in right ventricular wall thickness. Right ventricular systolic function is normal. There is moderately elevated pulmonary artery systolic pressure. The tricuspid regurgitant  velocity is 2.84 m/s, and with an assumed right atrial pressure of 15 mmHg, the estimated right ventricular systolic pressure is 47.3 mmHg. Left Atrium: Left atrial size was severely dilated. Right Atrium: Right atrial size was severely dilated. Pericardium: There is no evidence of pericardial effusion. Presence of epicardial fat layer. Mitral Valve: The mitral valve has been repaired/replaced. Mild mitral valve regurgitation. There is a bioprosthetic valve present in the mitral position. No evidence of mitral valve stenosis. MV peak gradient, 10.4 mmHg. The mean mitral valve gradient is 3.0 mmHg. Tricuspid Valve: The tricuspid valve is degenerative in appearance. Tricuspid valve regurgitation is severe. Aortic Valve: The aortic valve has an indeterminant number of cusps. There is mild calcification of the aortic valve. Aortic valve regurgitation is trivial. Aortic valve sclerosis/calcification is present, without any evidence of aortic stenosis. Aortic valve mean gradient measures 4.0 mmHg. Aortic valve peak gradient measures 4.8 mmHg. Aortic valve area, by VTI measures 4.02 cm. Pulmonic Valve: The pulmonic valve was grossly normal. Pulmonic valve regurgitation is mild. Aorta: The aortic root is normal in size and structure. There is mild dilatation of the ascending aorta, measuring 38 mm. Venous: The inferior vena cava is dilated in size with less than 50% respiratory variability, suggesting right atrial pressure of 15 mmHg. IAS/Shunts: No atrial level shunt detected by color flow Doppler.  LEFT VENTRICLE PLAX 2D LVIDd:         5.70 cm   Diastology LVIDs:         4.25 cm   LV e' medial:    9.03 cm/s LV PW:          1.40 cm   LV E/e' medial:  16.4 LV IVS:        1.60 cm   LV e' lateral:   10.60 cm/s LVOT diam:     2.00 cm   LV E/e' lateral: 14.0 LV SV:         74 LV SV Index:   35 LVOT Area:     3.14 cm  RIGHT VENTRICLE RV S prime:     9.68 cm/s LEFT ATRIUM              Index        RIGHT ATRIUM           Index LA Vol (A2C):   94.4 ml  44.75 ml/m  RA Area:     34.20 cm LA Vol (A4C):   111.0 ml 52.62 ml/m  RA Volume:   130.00 ml 61.63 ml/m LA Biplane Vol: 105.0 ml 49.78 ml/m  AORTIC VALVE AV Area (Vmax):    3.37 cm AV Area (Vmean):   3.19 cm AV Area (VTI):     4.02 cm AV Vmax:           110.00 cm/s AV Vmean:          92.400 cm/s AV VTI:            0.185 m AV Peak Grad:      4.8 mmHg AV Mean Grad:      4.0 mmHg LVOT Vmax:         118.00 cm/s LVOT Vmean:        93.900  cm/s LVOT VTI:          0.237 m LVOT/AV VTI ratio: 1.28  AORTA Ao Root diam: 3.50 cm MITRAL VALVE                TRICUSPID VALVE MV Area (PHT): 3.37 cm     TR Peak grad:   32.3 mmHg MV Area VTI:   2.85 cm     TR Vmax:        284.00 cm/s MV Peak grad:  10.4 mmHg MV Mean grad:  3.0 mmHg     SHUNTS MV Vmax:       1.61 m/s     Systemic VTI:  0.24 m MV Vmean:      81.1 cm/s    Systemic Diam: 2.00 cm MV Decel Time: 225 msec MV E velocity: 148.00 cm/s MV A velocity: 85.90 cm/s MV E/A ratio:  1.72 Toribio Fuel MD Electronically signed by Toribio Fuel MD Signature Date/Time: 09/01/2024/11:45:33 PM    Final    DG Chest 2 View Result Date: 08/31/2024 CLINICAL DATA:  Shortness of breath EXAM: CHEST - 2 VIEW COMPARISON:  06/20/2024 FINDINGS: Frontal and lateral views of the chest demonstrate stable enlargement of the cardiac silhouette. Postsurgical changes from mitral valve repair. Nerve stimulator within the right chest, lead extending cephalad. Increased pulmonary vascular congestion with mild bibasilar interstitial and ground-glass opacities concerning for developing edema. Trace left pleural effusion. No pneumothorax. IMPRESSION: 1. Constellation  of findings suggesting mild congestive heart failure and developing bibasilar edema. Electronically Signed   By: Ozell Daring M.D.   On: 08/31/2024 17:39    Cardiac Studies   Echo   Patient Profile     58 y.o. male   Assessment & Plan    Acute on chronic heart failure with improved ejection fraction Elevated troponin Dyspnea on exertion  CAD Paroxysmal atrial fibrillation  Hypertension  ESRD, now status post renal transplant  HIV  He has had some improvement with the Lasix  40 BID, will continue for now. Concern for high output failure in the setting of AVF - Will plan for the RHC hopefully today.   Blood pressure has improved, he was started on Amlodipine  5 mg daily. Continue Amlodipine  and Imdur .   We have consented him for the Right heart cath. Informed Consent   Shared Decision Making/Informed Consent The risks, including but not limited to, [bleeding or vascular complications (1 in 500), pneumothorax (1 in 1600), arrhythmia (1 in 1000) and death (1 in 5000)], benefits (diagnostic support and/or management of heart failure, pulmonary hypertension) and alternatives of a right heart catheterization were discussed in detail with Mr. Thoennes and he is willing to proceed.      His paroxysmal atrial fibrillation he is in sinus rhythm he is on Lopressor  12.5 mg twice daily continue this.  He needs to be anticoagulated would appreciate if nephrology can give us  their blessing from restarting his DOAC given this was stopped due to his transplant surgery.   In terms of his kidney transplant nephrology is following the case.   Other noncardiac medication is being managed by the primary team.   We will continue to follow with you   For questions or updates, please contact [facility] Please consult [email or web address] for contact info under Cardiology/STEMI.      Signed, Londell Noll, DO  09/02/2024, 7:57 AM

## 2024-09-02 NOTE — Progress Notes (Signed)
 Decision was made to pursue RHC today to assess for high out HF 2/2 AV fistula. Spoke with patient who consented.   Informed Consent   Shared Decision Making/Informed Consent The risks, including but not limited to, [bleeding or vascular complications (1 in 500), pneumothorax (1 in 1600), arrhythmia (1 in 1000) and death (1 in 5000)], benefits (diagnostic support and/or management of heart failure, pulmonary hypertension) and alternatives of a right heart catheterization were discussed in detail with Albert Eaton and he is willing to proceed.     Leontine Salen PA-C

## 2024-09-02 NOTE — Plan of Care (Signed)
  Problem: Education: Goal: Knowledge of General Education information will improve Description: Including pain rating scale, medication(s)/side effects and non-pharmacologic comfort measures 09/02/2024 0535 by Si Leventhal, Andrea, RN Outcome: Progressing 09/02/2024 0534 by Si Leventhal, Andrea, RN Outcome: Progressing   Problem: Health Behavior/Discharge Planning: Goal: Ability to manage health-related needs will improve 09/02/2024 0535 by Si Leventhal Andrea, RN Outcome: Progressing 09/02/2024 0534 by Si Leventhal, Andrea, RN Outcome: Progressing   Problem: Clinical Measurements: Goal: Will remain free from infection 09/02/2024 0535 by Si Leventhal Andrea, RN Outcome: Progressing 09/02/2024 0534 by Si Leventhal Andrea, RN Outcome: Progressing

## 2024-09-02 NOTE — Evaluation (Signed)
 Occupational Therapy Evaluation Patient Details Name: Albert Eaton MRN: [id number] DOB: 1966/02/13 Today's Date: 09/02/2024   History of Present Illness   58 yo M adm 08/31/24 with CHF exacerbation. PMhx: renal transplant Sept 2025, HfrEF, MVR, ESRD, pulmonary HTN, HIV, RCC s/p lt nephrectomy, CVA, neuropathy, PAF, HTN, OSA, gout, hypothyroidism, HLD, IDA     Clinical Impressions PTA, pt lives alone, typically Independent with ADLs, IADLs and mobility without AD though limited by SOB. Pt presents now with activity tolerance deficits. Pt able to manage ADLs with Setup Assist and no more than Supervision for mobility without AD. Pt able to stand 3 min for ADLs before progressive SOB and shakiness noted. Educated on energy conservation strategies with handout provided. Pt would also benefit from shower chair to address these deficits. Recommend HHOT at this time though pt with potential to progress beyond postacute OT needs.  SpO2 100% on RA, 4/4 DOE     If plan is discharge home, recommend the following:   Assistance with cooking/housework     Functional Status Assessment   Patient has had a recent decline in their functional status and demonstrates the ability to make significant improvements in function in a reasonable and predictable amount of time.     Equipment Recommendations   Tub/shower seat     Recommendations for Other Services         Precautions/Restrictions   Precautions Precautions: Fall;Other (comment) Precaution/Restrictions Comments: monitor DOE Restrictions Weight Bearing Restrictions Per Provider Order: No     Mobility Bed Mobility Overal bed mobility: Modified Independent                  Transfers Overall transfer level: Independent Equipment used: None                      Balance Overall balance assessment: No apparent balance deficits (not formally assessed)                                          ADL either performed or assessed with clinical judgement   ADL Overall ADL's : Needs assistance/impaired Eating/Feeding: Independent   Grooming: Set up;Standing;Oral care Grooming Details (indicate cue type and reason): able to stand > 3 min for oral care with progressive windedness Upper Body Bathing: Set up;Sitting   Lower Body Bathing: Set up;Sit to/from stand;Sitting/lateral leans   Upper Body Dressing : Set up   Lower Body Dressing: Set up   Toilet Transfer: Supervision/safety;Ambulation   Toileting- Clothing Manipulation and Hygiene: Set up         General ADL Comments: Provided energy conservation handout, initiated discussion of alterations for IADLs, discussed shower chair for home use and plan to ask for this DME     Vision Baseline Vision/History: 0 No visual deficits Ability to See in Adequate Light: 0 Adequate Patient Visual Report: No change from baseline Vision Assessment?: No apparent visual deficits;Wears glasses for reading     Perception         Praxis         Pertinent Vitals/Pain Pain Assessment Pain Assessment: No/denies pain     Extremity/Trunk Assessment Upper Extremity Assessment Upper Extremity Assessment: Overall WFL for tasks assessed;Right hand dominant   Lower Extremity Assessment Lower Extremity Assessment: Defer to PT evaluation   Cervical / Trunk Assessment Cervical / Trunk Assessment: Normal   Communication Communication Communication:  No apparent difficulties   Cognition Arousal: Alert Behavior During Therapy: WFL for tasks assessed/performed Cognition: No apparent impairments                               Following commands: Intact       Cueing  General Comments   Cueing Techniques: Verbal cues      Exercises     Shoulder Instructions      Home Living Family/patient expects to be discharged to:: Private residence Living Arrangements: Alone Available Help at Discharge: Family;Available  PRN/intermittently Type of Home: Apartment Home Access: Level entry;Elevator     Home Layout: One level     Bathroom Shower/Tub: Chief Strategy Officer: Standard     Home Equipment: Rollator (4 wheels)   Additional Comments: has parking garage on lower level, elevator up to his first floor apartment from there. reports his unit is 2 doors down from elevator      Prior Functioning/Environment Prior Level of Function : Independent/Modified Independent;Driving             Mobility Comments: no AD for mobility. was given rollator after recent hospital stay but has not used ADLs Comments: Indep with ADLs, IADLs but very limited by DOE    OT Problem List: Cardiopulmonary status limiting activity;Decreased activity tolerance   OT Treatment/Interventions: Self-care/ADL training;Therapeutic exercise;Energy conservation;DME and/or AE instruction;Therapeutic activities;Patient/family education      OT Goals(Current goals can be found in the care plan section)   Acute Rehab OT Goals Patient Stated Goal: resolve SOB OT Goal Formulation: With patient Time For Goal Achievement: 09/16/24 Potential to Achieve Goals: Good ADL Goals Additional ADL Goal #1: Pt to verbalize at least 3 energy conservation strategies to implement during ADLs, IADLs and mobility at home Additional ADL Goal #2: Pt to increase standing activity tolerance > 6 min during ADLs/mobility without need for seated rest break   OT Frequency:  Min 2X/week    Co-evaluation              AM-PAC OT 6 Clicks Daily Activity     Outcome Measure Help from another person eating meals?: None Help from another person taking care of personal grooming?: A Little Help from another person toileting, which includes using toliet, bedpan, or urinal?: A Little Help from another person bathing (including washing, rinsing, drying)?: A Little Help from another person to put on and taking off regular upper body  clothing?: A Little Help from another person to put on and taking off regular lower body clothing?: A Little 6 Click Score: 19   End of Session Nurse Communication: Mobility status  Activity Tolerance: Patient tolerated treatment well Patient left: in bed;with call bell/phone within reach  OT Visit Diagnosis: Other (comment) (decreased cardiopulmonary tolerance)                Time: 9179-9152 OT Time Calculation (min): 27 min Charges:  OT General Charges $OT Visit: 1 Visit OT Evaluation $OT Eval Low Complexity: 1 Low OT Treatments $Self Care/Home Management : 8-22 mins  Mliss NOVAK, OTR/L Acute Rehab Services Office: [phone]   Mliss Fish 09/02/2024, 8:58 AM

## 2024-09-02 NOTE — Discharge Instructions (Signed)
 [employer] assistance programs Crisis assistance programs  -[facility] Arts development officer. If you are experiencing homelessness in [place], [place], your first point of contact should be Pensions consultant. You can reach Coordinated Entry by calling [phone] or by emailing [email or web address].  Community access points: [employer] [phone], [place]) every Tuesday from 9am-10am. [facility] ([address], [place]) every Wednesday from 8am-9am.   -[place] Coordinated Re-entry Marcy Panning: Dial 211 and request. Offers referrals to homeless shelters in the area.    -The [employer] [phone]) offers several services to local families, as funding allows. The Emergency Assistance Program (EAP), which they administer, provides household goods, free food, clothing, and financial aid to people in need in the [facility] area. The EAP program does have some qualification, and counselors will interview clients for financial assistance by written referral only. Referrals need to be made by the Department of Social Services or by other EAP approved human services agencies or charities in the area.  -[employer], which can be reached at [phone], offers emergency assistance programs for those in need of help, such as food, rent assistance, a soup kitchen, shelter, and clothing. They are based in [facility] but provide a number of services to those that qualify for assistance.   [facility] Department of Social Services may be able to offer temporary financial assistance and cash grants for paying rent and utilities, Help may be provided for local county residents who may be experiencing personal crisis when other resources, including government programs, are not available. Call [phone]  -[employer] is a [employer] agency, The organization can offer emergency assistance for paying rent, [employer], utilities, food, household products and furniture. They offer extensive emergency and transitional housing for families, children and single women, and also run a [employer]. Thrift Shops, Secondary school teacher, and other aid offered too. [address], [place], [place] [address], [phone]  -[place] Low Income Energy Assistance Program -- This is offered for [facility] families. The federal government created [employer] Program provides a one-time cash grant payment to help eligible low-income families pay their electric and heating bills. [address], [place], [place] [address], [phone]  -[place] Emergency [facility] -- A program offers emergency utility and rent funds for greater [employer] area residents. The program can also provide counseling and referrals to charities and government programs. Also provides food and a free meal program that serves lunch Mondays - Saturdays and dinner seven days per week to individuals in the community. [address] [place], [place] [address], [phone]  -[employer] - Offers affordable apartment and housing communities across      [place] and [place]. The low income and seniors can access public housing, rental assistance to qualified applicants, and apply for the section 8 rent subsidy program. Other programs include Chiropractor and Engineer, maintenance. [address], [place], [place] [address], dial [phone].  -[facility] provides transitional housing to veterans and the disabled. Clients will also access other services too, including assistance in applying for Disability, life skills classes, case management, and assistance in finding permanent housing. [address] [place], [place]  [place] [address], call [phone]  -[facility] Transitional Housing through [facility] is for people who were just  evicted or that are formerly homeless. The non-profit will also help then gain self-sufficiency, find a home or apartment to live in, and also provides information on rent assistance when needed. Phone [phone]  -The [place] [facility] [facility] helps low income, elderly, or disabled residents in seven counties in the [place] ([place], [place], [place], [place], [place], [place], [place], and [place]) save energy and reduce their utility bills by improving energy efficiency. Phone [phone].  -[employer] is located in the [place] [facility] in the [employer], [address], [address], [place], [place] [address]. Parking is in the rear of the building. Phone: [phone]   General Email: Oley Balm  [facility] provides free housing counseling assistance in locating affordable rental housing or housing with support services for families and individuals in crisis and the chronically homeless. We provide potential resources for other housing needs like utilities. Our trained counselors also work with clients on budgeting and financial literacy in effort to empower them to take control of their financial situations. [employer] collaborates with homeless service providers and other stakeholders as part of the [employer] COC (Continuum of Care). The (COC) is a regional/local planning body that coordinates housing and services funding for homeless families and individuals. The role of [facility] in the COC is through housing counseling to work with people we serve on diversion strategies for those that are at imminent risk of becoming homeless. We also work with the Coordinated Assessment/Entry Specialist who attempts to find temporary solutions and/or connects the people  to Housing First, Rapid Re-housing or transitional housing programs. Our Homelessness Prevention Housing Counselors meet with clients on business days (Monday-Fridays, except scheduled holidays) from 8:30 am to 4:30 pm.

## 2024-09-02 NOTE — Plan of Care (Signed)
   Problem: Education: Goal: Knowledge of General Education information will improve Description: Including pain rating scale, medication(s)/side effects and non-pharmacologic comfort measures Outcome: Progressing   Problem: Health Behavior/Discharge Planning: Goal: Ability to manage health-related needs will improve Outcome: Progressing   Problem: Clinical Measurements: Goal: Will remain free from infection Outcome: Progressing

## 2024-09-02 NOTE — Progress Notes (Signed)
 Patient has the inspire in arm and does not need cpap or bipap per patient request.

## 2024-09-02 NOTE — Progress Notes (Signed)
 PROGRESS NOTE    Albert Eaton  FMW:[id number] DOB: 11-05-1965 DOA: 08/31/2024 PCP: [name], NP  58/M with chronic systolic CHF, ESRD recent renal transplant on 07/07/2024, HIV, hypertension, hypothyroidism, OSA, CAD, atrial flutter presented to the ED with ongoing dyspnea on exertion and severe orthopnea for 3 months.  Symptoms started prior to his renal transplant and have persisted since, only minimal swelling noted compliant with home antirejection medications.  In the ED vital stable, WBC 7, hemoglobin 14, troponin 40, 50 creatinine 2.4, BNP 1579, chest x-ray with mild bibasilar edema. - Admitted, cards and nephrology consulting, started on diuretics,   Subjective: -Feels a little better, continues to have orthopnea  Assessment and Plan:  Acute on chronic diastolic CHF 2D echo May 2025 with EF of 50 to 55%, moderately elevated PASP, normal RV -nephrology concerned regarding high-output CHF from large AV fistula, plan for RHC today -He is 3 L negative, continue Lasix  40 mg twice daily, -GDMT limited by CKD 4  History of ESRD status post renal transplant Baseline ESRD on hemodialysis status post DDKT July 07, 20[facility] in the [facility] On immunosuppressant regimen including Myfortic, tacrolimus, prednisone  CMV ppx: Valcyte x 3 months (EOT 10/06/2024)  PJP ppx:Bactrim x 6 months (EOT 01/12/2025 unless CD4 <200) Appreciate nephrology input  Atrial flutter, unspecified type (HCC) Noted prior history of atrial fibrillation and atrial flutter Was previously on Coumadin  Looks to be on hold from recent renal transplant September 2025 Restart DOAC tomorrow after RHC  CAD (coronary artery disease) Stable, continue aspirin  and Crestor   Obstructive sleep apnea CPAP  Chronic kidney disease, unspecified Creatinine 2.14 today with noted recent renal transplant September 2025 GFR in the 30s Overall renal function appears to be slowly improving since transplant with most recent  creatinine around 3 prior to admission  Arthritis, gouty Continue allopurinol   HIV disease (HCC) On Biktarvy  chronically Reports compliance Check CD4 count in setting of immunosuppression s/p renal transplant 06/2024     DVT prophylaxis: SCDs Code Status: Full code Family Communication: None present Disposition Plan: Home pending above workup  Consultants:    Procedures:   Antimicrobials:    Objective: Vitals:   09/02/24 0323 09/02/24 0526 09/02/24 0818 09/02/24 0854  BP: 130/88   (!) 139/92  Pulse: 74     Resp: 17  16 16   Temp: 97.6 F (36.4 C)   97.9 F (36.6 C)  TempSrc: Oral   Oral  SpO2: 100%   97%  Weight:  94.1 kg    Height:        Intake/Output Summary (Last 24 hours) at 09/02/2024 1004 Last data filed at 09/02/2024 0854 Gross per 24 hour  Intake 356 ml  Output 1700 ml  Net -1344 ml   Filed Weights   08/31/24 1619 09/01/24 1200 09/02/24 0526  Weight: 93 kg 94.1 kg 94.1 kg    Examination:  General exam: Appears calm and comfortable  HEENT: Positive JVD Respiratory system: Rare basilar rales Cardiovascular system: S1 & S2 heard, RRR.  Abd: nondistended, soft and nontender.Normal bowel sounds heard. Central nervous system: Alert and oriented. No focal neurological deficits. Extremities: no edema Skin: No rashes Psychiatry:  Mood & affect appropriate.     Data Reviewed:   CBC: Recent Labs  Lab 08/31/24 1612 09/02/24 0257  WBC 7.1 6.5  NEUTROABS 5.6  --   HGB 10.4* 10.5*  HCT 32.4* 31.5*  MCV 99.1 96.6  PLT 205 190   Basic Metabolic Panel: Recent Labs  Lab  08/31/24 1612 09/01/24 1433 09/02/24 0257  NA 137  --  134*  K 4.3  --  3.8  CL 109  --  101  CO2 19*  --  20*  GLUCOSE 122*  --  119*  BUN 30*  --  36*  CREATININE 2.44*  --  2.77*  CALCIUM  8.9  --  8.6*  PHOS  --  1.8*  --    GFR: Estimated Creatinine Clearance: 33.5 mL/min (A) (by C-G formula based on SCr of 2.77 mg/dL (H)). Liver Function Tests: Recent Labs  Lab  09/02/24 0257  AST 33  ALT 59*  ALKPHOS 93  BILITOT 0.8  PROT 5.9*  ALBUMIN  3.6   No results for input(s): LIPASE, AMYLASE in the last 168 hours. No results for input(s): AMMONIA in the last 168 hours. Coagulation Profile: No results for input(s): INR, PROTIME in the last 168 hours. Cardiac Enzymes: No results for input(s): CKTOTAL, CKMB, CKMBINDEX, TROPONINI in the last 168 hours. BNP (last 3 results) No results for input(s): PROBNP in the last 8760 hours. HbA1C: No results for input(s): HGBA1C in the last 72 hours. CBG: No results for input(s): GLUCAP in the last 168 hours. Lipid Profile: No results for input(s): CHOL, HDL, LDLCALC, TRIG, CHOLHDL, LDLDIRECT in the last 72 hours. Thyroid  Function Tests: No results for input(s): TSH, T4TOTAL, FREET4, T3FREE, THYROIDAB in the last 72 hours. Anemia Panel: No results for input(s): VITAMINB12, FOLATE, FERRITIN, TIBC, IRON , RETICCTPCT in the last 72 hours. Urine analysis:    Component Value Date/Time   COLORURINE STRAW (A) 08/31/2024 1613   APPEARANCEUR CLEAR 08/31/2024 1613   LABSPEC 1.006 08/31/2024 1613   PHURINE 5.0 08/31/2024 1613   GLUCOSEU NEGATIVE 08/31/2024 1613   HGBUR NEGATIVE 08/31/2024 1613   BILIRUBINUR NEGATIVE 08/31/2024 1613   BILIRUBINUR neg 12/27/2019 0913   KETONESUR NEGATIVE 08/31/2024 1613   PROTEINUR NEGATIVE 08/31/2024 1613   UROBILINOGEN 0.2 12/27/2019 0913   UROBILINOGEN 0.2 01/15/2018 1140   NITRITE NEGATIVE 08/31/2024 1613   LEUKOCYTESUR NEGATIVE 08/31/2024 1613   Sepsis Labs: @LABRCNTIP (procalcitonin:4,lacticidven:4)  )No results found for this or any previous visit (from the past 240 hours).   Radiology Studies: ECHOCARDIOGRAM COMPLETE Result Date: 09/01/2024    ECHOCARDIOGRAM REPORT   Patient Name:   Albert Eaton Date of Exam: 09/01/2024 Medical Rec #:  [id number]       Height:       70.0 in Accession #:    [id number]      Weight:        205.0 lb Date of Birth:  18-May-1966        BSA:          2.109 m Patient Age:    58 years        BP:           166/89 mmHg Patient Gender: M               HR:           88 bpm. Exam Location:  Inpatient Procedure: 2D Echo, Cardiac Doppler and Color Doppler (Both Spectral and Color            Flow Doppler were utilized during procedure). Indications:    Congestive Heart Failure I50.9  History:        Patient has no prior history of Echocardiogram examinations,                 most recent 03/03/2024.  Mitral Valve: bioprosthetic valve valve is present in the mitral                 position.  Sonographer:    Jayson Gaskins Referring Phys: STEVEN J NEWTON IMPRESSIONS  1. Left ventricular ejection fraction, by estimation, is 50 to 55%. The left ventricle has low normal function. The left ventricle has no regional wall motion abnormalities. The left ventricular internal cavity size was mildly dilated. There is moderate  concentric left ventricular hypertrophy. Left ventricular diastolic parameters are indeterminate.  2. Right ventricular systolic function is normal. The right ventricular size is moderately enlarged. There is moderately elevated pulmonary artery systolic pressure. The estimated right ventricular systolic pressure is 47.3 mmHg.  3. Left atrial size was severely dilated.  4. Right atrial size was severely dilated.  5. The mitral valve has been repaired/replaced. Mild mitral valve regurgitation. No evidence of mitral stenosis. There is a bioprosthetic valve present in the mitral position.  6. The tricuspid valve is degenerative. Tricuspid valve regurgitation is severe.  7. The aortic valve has an indeterminant number of cusps. There is mild calcification of the aortic valve. Aortic valve regurgitation is trivial. Aortic valve sclerosis/calcification is present, without any evidence of aortic stenosis.  8. There is mild dilatation of the ascending aorta, measuring 38 mm.  9. The inferior vena  cava is dilated in size with <50% respiratory variability, suggesting right atrial pressure of 15 mmHg. FINDINGS  Left Ventricle: Left ventricular ejection fraction, by estimation, is 50 to 55%. The left ventricle has low normal function. The left ventricle has no regional wall motion abnormalities. The left ventricular internal cavity size was mildly dilated. There is moderate concentric left ventricular hypertrophy. Left ventricular diastolic parameters are indeterminate. Right Ventricle: The right ventricular size is moderately enlarged. No increase in right ventricular wall thickness. Right ventricular systolic function is normal. There is moderately elevated pulmonary artery systolic pressure. The tricuspid regurgitant  velocity is 2.84 m/s, and with an assumed right atrial pressure of 15 mmHg, the estimated right ventricular systolic pressure is 47.3 mmHg. Left Atrium: Left atrial size was severely dilated. Right Atrium: Right atrial size was severely dilated. Pericardium: There is no evidence of pericardial effusion. Presence of epicardial fat layer. Mitral Valve: The mitral valve has been repaired/replaced. Mild mitral valve regurgitation. There is a bioprosthetic valve present in the mitral position. No evidence of mitral valve stenosis. MV peak gradient, 10.4 mmHg. The mean mitral valve gradient is 3.0 mmHg. Tricuspid Valve: The tricuspid valve is degenerative in appearance. Tricuspid valve regurgitation is severe. Aortic Valve: The aortic valve has an indeterminant number of cusps. There is mild calcification of the aortic valve. Aortic valve regurgitation is trivial. Aortic valve sclerosis/calcification is present, without any evidence of aortic stenosis. Aortic valve mean gradient measures 4.0 mmHg. Aortic valve peak gradient measures 4.8 mmHg. Aortic valve area, by VTI measures 4.02 cm. Pulmonic Valve: The pulmonic valve was grossly normal. Pulmonic valve regurgitation is mild. Aorta: The aortic root  is normal in size and structure. There is mild dilatation of the ascending aorta, measuring 38 mm. Venous: The inferior vena cava is dilated in size with less than 50% respiratory variability, suggesting right atrial pressure of 15 mmHg. IAS/Shunts: No atrial level shunt detected by color flow Doppler.  LEFT VENTRICLE PLAX 2D LVIDd:         5.70 cm   Diastology LVIDs:         4.25 cm   LV e' medial:  9.03 cm/s LV PW:         1.40 cm   LV E/e' medial:  16.4 LV IVS:        1.60 cm   LV e' lateral:   10.60 cm/s LVOT diam:     2.00 cm   LV E/e' lateral: 14.0 LV SV:         74 LV SV Index:   35 LVOT Area:     3.14 cm  RIGHT VENTRICLE RV S prime:     9.68 cm/s LEFT ATRIUM              Index        RIGHT ATRIUM           Index LA Vol (A2C):   94.4 ml  44.75 ml/m  RA Area:     34.20 cm LA Vol (A4C):   111.0 ml 52.62 ml/m  RA Volume:   130.00 ml 61.63 ml/m LA Biplane Vol: 105.0 ml 49.78 ml/m  AORTIC VALVE AV Area (Vmax):    3.37 cm AV Area (Vmean):   3.19 cm AV Area (VTI):     4.02 cm AV Vmax:           110.00 cm/s AV Vmean:          92.400 cm/s AV VTI:            0.185 m AV Peak Grad:      4.8 mmHg AV Mean Grad:      4.0 mmHg LVOT Vmax:         118.00 cm/s LVOT Vmean:        93.900 cm/s LVOT VTI:          0.237 m LVOT/AV VTI ratio: 1.28  AORTA Ao Root diam: 3.50 cm MITRAL VALVE                TRICUSPID VALVE MV Area (PHT): 3.37 cm     TR Peak grad:   32.3 mmHg MV Area VTI:   2.85 cm     TR Vmax:        284.00 cm/s MV Peak grad:  10.4 mmHg MV Mean grad:  3.0 mmHg     SHUNTS MV Vmax:       1.61 m/s     Systemic VTI:  0.24 m MV Vmean:      81.1 cm/s    Systemic Diam: 2.00 cm MV Decel Time: 225 msec MV E velocity: 148.00 cm/s MV A velocity: 85.90 cm/s MV E/A ratio:  1.72 Toribio Fuel MD Electronically signed by Toribio Fuel MD Signature Date/Time: 09/01/2024/11:45:33 PM    Final    DG Chest 2 View Result Date: 08/31/2024 CLINICAL DATA:  Shortness of breath EXAM: CHEST - 2 VIEW COMPARISON:  06/20/2024  FINDINGS: Frontal and lateral views of the chest demonstrate stable enlargement of the cardiac silhouette. Postsurgical changes from mitral valve repair. Nerve stimulator within the right chest, lead extending cephalad. Increased pulmonary vascular congestion with mild bibasilar interstitial and ground-glass opacities concerning for developing edema. Trace left pleural effusion. No pneumothorax. IMPRESSION: 1. Constellation of findings suggesting mild congestive heart failure and developing bibasilar edema. Electronically Signed   By: Ozell Daring M.D.   On: 08/31/2024 17:39     Scheduled Meds:  allopurinol   100 mg Oral Daily   amLODipine   5 mg Oral Daily   aspirin  EC  81 mg Oral Daily   atorvastatin   40 mg Oral Daily   bictegravir-emtricitabine -tenofovir  AF  1 tablet Oral Daily   cholecalciferol  400 Units Oral Daily   cinacalcet   30 mg Oral Q supper   enoxaparin  (LOVENOX ) injection  40 mg Subcutaneous Q24H   fluticasone  furoate-vilanterol  1 puff Inhalation Daily   furosemide   40 mg Intravenous BID   isosorbide  mononitrate  30 mg Oral Daily   levothyroxine   25 mcg Oral Q0600   metoprolol  tartrate  12.5 mg Oral BID   mycophenolate  360 mg Oral TID   pantoprazole   40 mg Oral Daily   predniSONE   5 mg Oral Q breakfast   rOPINIRole   1 mg Oral QHS   sodium chloride  flush  3 mL Intravenous Q12H   sulfamethoxazole-trimethoprim  1 tablet Oral Q M,W,F   tacrolimus ER  8 mg Oral Daily   And   tacrolimus ER  1 mg Oral QAC breakfast   Continuous Infusions:  sodium chloride      sodium PHOSPHATE  IVPB (in mmol)       LOS: 0 days    Time spent:    Sigurd Pac, MD [facility]   09/02/2024, 10:04 AM

## 2024-09-02 NOTE — Care Management Obs Status (Signed)
 MEDICARE OBSERVATION STATUS NOTIFICATION   Patient Details  Name: Albert Eaton MRN: [id number] Date of Birth: 1966-06-07   Medicare Observation Status Notification Given:  Yes    Vonzell Arrie Sharps 09/02/2024, 8:52 AM

## 2024-09-02 NOTE — Progress Notes (Signed)
 Admit: 08/31/2024 LOS: 0  35M admit with progressive SOB in setting of KT 06/2024, HICV, CAD, hx/o HFmrEF  Subjective:  C/o ongoing troubling orthopnea, present for several months, progressive  UOP 1.7L yesterday on lasix  40  IV BID Weights unchanged SCr stable 2.8, K 3.8, HCO3 20 Hb 10.5 On baseline Pred / Tac/ MMF TTE 09/01/24 LVEF 50-55%  11/06 0701 - 11/07 0700 In: 236 [P.O.:236] Out: 1700 [Urine:1700]  Filed Weights   08/31/24 1619 09/01/24 1200 09/02/24 0526  Weight: 93 kg 94.1 kg 94.1 kg    Scheduled Meds:  allopurinol   100 mg Oral Daily   amLODipine   5 mg Oral Daily   aspirin  EC  81 mg Oral Daily   atorvastatin   40 mg Oral Daily   bictegravir-emtricitabine -tenofovir  AF  1 tablet Oral Daily   cholecalciferol  400 Units Oral Daily   cinacalcet   30 mg Oral Q supper   enoxaparin  (LOVENOX ) injection  40 mg Subcutaneous Q24H   fluticasone  furoate-vilanterol  1 puff Inhalation Daily   furosemide   40 mg Intravenous BID   isosorbide  mononitrate  30 mg Oral Daily   metoprolol  tartrate  12.5 mg Oral BID   mycophenolate  360 mg Oral TID   pantoprazole   40 mg Oral Daily   predniSONE   5 mg Oral Q breakfast   rOPINIRole   1 mg Oral QHS   sodium chloride  flush  3 mL Intravenous Q12H   sulfamethoxazole-trimethoprim  1 tablet Oral Q M,W,F   tacrolimus ER  8 mg Oral Daily   And   tacrolimus ER  1 mg Oral QAC breakfast   Continuous Infusions:  sodium chloride      PRN Meds:.sodium chloride , acetaminophen  **OR** acetaminophen , hydrALAZINE , ipratropium-albuterol , sodium chloride  flush  Current Labs: reviewed   Physical Exam:  Blood pressure 130/88, pulse 74, temperature 97.6 F (36.4 C), temperature source Oral, resp. rate 17, height 5' 10 (1.778 m), weight 94.1 kg, SpO2 100%. NAD LUE AVF +B/T with some aneurysmal changes Nl WOB Diminished in bases b/l RRR nl s1 s2 no murmr No sig LEE  A Post transplant CKD4, stable S/p KT 07/07/2024 at [facility] c/b DGF and Acute and  Chronic/Active TCMR treated with pulse steroids end of October Progressive orthopnea and PND; LVEF ok: ? High output from AVF Chronic IS on tac/mmf/pred; baseline home doses Chronic HIV HTN, stable BPs OSA CAD 2HPTH, started cinacalcet  and vit D; watch Ca Low P, repeat  P Cont lasix  at current dosing ? If needs RHC to eval CO in setting of AVF Medication Issues; Preferred narcotic agents for pain control are hydromorphone , fentanyl , and methadone. Morphine  should not be used.  Baclofen should be avoided Avoid oral sodium phosphate  and magnesium  citrate based laxatives / bowel preps    Bernardino Gasman MD 09/02/2024, 7:52 AM  Recent Labs  Lab 08/31/24 1612 09/01/24 1433 09/02/24 0257  NA 137  --  134*  K 4.3  --  3.8  CL 109  --  101  CO2 19*  --  20*  GLUCOSE 122*  --  119*  BUN 30*  --  36*  CREATININE 2.44*  --  2.77*  CALCIUM  8.9  --  8.6*  PHOS  --  1.8*  --    Recent Labs  Lab 08/31/24 1612 09/02/24 0257  WBC 7.1 6.5  NEUTROABS 5.6  --   HGB 10.4* 10.5*  HCT 32.4* 31.5*  MCV 99.1 96.6  PLT 205 190

## 2024-09-03 ENCOUNTER — Other Ambulatory Visit (HOSPITAL_COMMUNITY): Payer: Self-pay

## 2024-09-03 DIAGNOSIS — T8611 Kidney transplant rejection: Secondary | ICD-10-CM

## 2024-09-03 DIAGNOSIS — I5083 High output heart failure: Secondary | ICD-10-CM

## 2024-09-03 DIAGNOSIS — N184 Chronic kidney disease, stage 4 (severe): Secondary | ICD-10-CM | POA: Diagnosis not present

## 2024-09-03 DIAGNOSIS — I5033 Acute on chronic diastolic (congestive) heart failure: Secondary | ICD-10-CM | POA: Diagnosis not present

## 2024-09-03 DIAGNOSIS — I509 Heart failure, unspecified: Secondary | ICD-10-CM | POA: Diagnosis not present

## 2024-09-03 LAB — CBC
HCT: 31.9 % — ABNORMAL LOW (ref 39.0–52.0)
Hemoglobin: 10.8 g/dL — ABNORMAL LOW (ref 13.0–17.0)
MCH: 32.1 pg (ref 26.0–34.0)
MCHC: 33.9 g/dL (ref 30.0–36.0)
MCV: 94.9 fL (ref 80.0–100.0)
Platelets: 191 K/uL (ref 150–400)
RBC: 3.36 MIL/uL — ABNORMAL LOW (ref 4.22–5.81)
RDW: 18 % — ABNORMAL HIGH (ref 11.5–15.5)
WBC: 5.2 K/uL (ref 4.0–10.5)
nRBC: 0 % (ref 0.0–0.2)

## 2024-09-03 LAB — BASIC METABOLIC PANEL WITH GFR
Anion gap: 11 (ref 5–15)
BUN: 34 mg/dL — ABNORMAL HIGH (ref 6–20)
CO2: 22 mmol/L (ref 22–32)
Calcium: 8.3 mg/dL — ABNORMAL LOW (ref 8.9–10.3)
Chloride: 102 mmol/L (ref 98–111)
Creatinine, Ser: 2.68 mg/dL — ABNORMAL HIGH (ref 0.61–1.24)
GFR, Estimated: 27 mL/min — ABNORMAL LOW (ref >60)
Glucose, Bld: 99 mg/dL (ref 70–99)
Potassium: 3.5 mmol/L (ref 3.5–5.1)
Sodium: 135 mmol/L (ref 135–145)

## 2024-09-03 MED ORDER — APIXABAN 5 MG PO TABS
5.0000 mg | ORAL_TABLET | Freq: Two times a day (BID) | ORAL | Status: DC
  Administered 2024-09-03 – 2024-09-05 (×5): 5 mg via ORAL
  Filled 2024-09-03 (×5): qty 1

## 2024-09-03 MED ORDER — VITAMIN D 25 MCG (1000 UNIT) PO TABS
1000.0000 [IU] | ORAL_TABLET | Freq: Every day | ORAL | Status: DC
  Administered 2024-09-03 – 2024-09-09 (×7): 1000 [IU] via ORAL
  Filled 2024-09-03 (×7): qty 1

## 2024-09-03 NOTE — Progress Notes (Signed)
 Pt is experiencing shortness of breath with 02 sats of 99%. Pt states that its random at rest Applied 02 n/c at 1l to help with SOB of breath.

## 2024-09-03 NOTE — Consult Note (Signed)
 [facility] [place]  ASSESSMENT / PLAN: 58 y.o. male with: 1) large left brachiobasilic AVF with high flow rate (>2.3L/m in 2019 by duplex) 2) CKD IV after kidney transplantation 07/23/24 at [facility] 2/2 rejection and delayed graft function 3) High output heart failure by right heart cath 09/02/24  Only definitive option for treatment of high output heart failure in setting of AVF with high flow rate is ligation. Banding the fistula may restrict flow to decrease high output HF, but I have personally never seen this approach work. Ligation problematic given his delayed graft function and CKD IV and likely need for HD in near future. Will need interdisciplinary discussion to establish a treatment plan.   CHIEF COMPLAINT: High output heart failure  HISTORY OF PRESENT ILLNESS: Albert Eaton is a 58 y.o. male admitted to the internal medicine service for heart failure in setting of recent kidney transplant and high output left arm brachiobasilic AV fistula.  His transplant course was complicated by delayed graft function and rejection and he now has a GFR of 27.  Right heart catheterization performed yesterday showed elevated right heart filling pressures consistent with high-output heart failure.  He recently underwent revision of his left arm brachiobasilic AV fistula with Dr. Gretta in [place] 2025 for ulcerated AV fistula.  On my evaluation, the patient appears well.  He is in no acute distress.  I reviewed the challenging situation he is in, and counseled him that we would likely need to decide as a team together how best to proceed.  There is no ideal solution to his problem at the moment.  Past Medical History:  Diagnosis Date   Anemia    Aortic atherosclerosis    Arthritis    Ascending aorta dilation 02/17/2023   Limited TTE 02/17/23: no effusion, s/p MV repair w trivial residual MR, asc aorta 39 mm, mod TR, severe LAE   Asthma    Cancer (HCC)    renal cyst   Chronic  diastolic CHF (congestive heart failure) (HCC) 01/06/2017   Echo 7/16 [facility] in [place], [place]) Mild AI, mild LAE, mild concentric LVH, EF 55, normal wall motion, mild to moderate MR, mild PI, RVSP 55 // Echo 10/09/16 ([facility]):  Moderate LVH, grade 2 diastolic dysfunction, mild MR, moderate LAE    Dysrhythmia    paroxysmal afib/flutter   Esophagitis    ESRD (end stage renal disease) on dialysis [facility])    Dialysis Mon Wed Fri   GERD (gastroesophageal reflux disease)    Gout    no current problems   Heart murmur    had it since he was a baby- pt had echo 10/01/23   Hepatitis    Hep B   HIV (human immunodeficiency virus infection) (HCC)    HLD (hyperlipidemia)    Hypertension    Hypothyroidism    Mitral regurgitation    Myocardial infarction (HCC)    Neuropathy    Sleep apnea    does not use c-pap   Stroke [facility])    Wears glasses    Wears partial dentures     Past Surgical History:  Procedure Laterality Date   AV FISTULA PLACEMENT Left 07/02/2018   Procedure: Creation of Left arm BRACHIOBASILIC ARTERIOVENOUS  FISTULA;  Surgeon: Gretta Lonni PARAS, MD;  Location: [facility] OR;  Service: Vascular;  Laterality: Left;   BASCILIC VEIN TRANSPOSITION Left 08/27/2018   Procedure: Left arm BRACHIOBASILIC VEIN TRANSPOSITION SECOND STAGE;  Surgeon: Gretta Lonni PARAS, MD;  Location: [facility]  OR;  Service: Vascular;  Laterality: Left;   BUBBLE STUDY  09/03/2020   Procedure: BUBBLE STUDY;  Surgeon: Okey Vina GAILS, MD;  Location: [facility] ENDOSCOPY;  Service: Cardiovascular;;   BUBBLE STUDY  06/03/2022   Procedure: BUBBLE STUDY;  Surgeon: Loni Soyla LABOR, MD;  Location: [facility] ENDOSCOPY;  Service: Cardiology;;   CAPD INSERTION N/A 05/30/2021   Procedure: LAPAROSCOPIC INSERTION CONTINUOUS AMBULATORY PERITONEAL DIALYSIS  (CAPD) CATHETER;  Surgeon: Magda Debby SAILOR, MD;  Location: [facility] OR;  Service: Vascular;  Laterality: N/A;   CAPD REMOVAL N/A 02/13/2022   Procedure: PERITONEAL DIALYSIS CATHETER REMOVAL;   Surgeon: Magda Debby SAILOR, MD;  Location: [facility] OR;  Service: Vascular;  Laterality: N/A;   CHOLECYSTECTOMY     COLONOSCOPY W/ BIOPSIES AND POLYPECTOMY     DRUG INDUCED ENDOSCOPY Bilateral 07/06/2023   Procedure: DRUG INDUCED SLEEP ENDOSCOPY;  Surgeon: Carlie Clark, MD;  Location: [facility] OR;  Service: ENT;  Laterality: Bilateral;   GIVENS CAPSULE STUDY N/A 03/01/2018   Procedure: GIVENS CAPSULE STUDY;  Surgeon: Albertus Gordy HERO, MD;  Location: [facility] ENDOSCOPY;  Service: Gastroenterology;  Laterality: N/A;   HERNIA REPAIR     As baby   IMPLANTATION OF HYPOGLOSSAL NERVE STIMULATOR Right 01/14/2024   Procedure: RIGHT IMPLANTATION OF HYPOGLOSSAL NERVE STIMULATOR;  Surgeon: Carlie Clark, MD;  Location: [facility] OR;  Service: ENT;  Laterality: Right;   INSERTION OF DIALYSIS CATHETER Right 01/20/2024   Procedure: INSERTION OF DIALYSIS CATHETER USING 19cm PALINDROME CHRONIC CATHETER INTO THE RIGHT INTERNAL JUGULAR;  Surgeon: Gretta Lonni PARAS, MD;  Location: [facility] OR;  Service: Vascular;  Laterality: Right;   LEFT HEART CATH AND CORONARY ANGIOGRAPHY N/A 09/11/2022   Procedure: LEFT HEART CATH AND CORONARY ANGIOGRAPHY;  Surgeon: Dann Candyce RAMAN, MD;  Location: [facility] INVASIVE CV LAB;  Service: Cardiovascular;  Laterality: N/A;   MITRAL VALVE REPAIR N/A 08/21/2022   Procedure: MINIMALLY INVASIVE MITRAL VALVE REPAIR USING RING ANNULOPLASTY SIMULUS 30mm;  Surgeon: Maryjane Mt, MD;  Location: [facility] OR;  Service: Open Heart Surgery;  Laterality: N/A;   MULTIPLE TOOTH EXTRACTIONS     NEPHRECTOMY Left 02/18/2021   NOSE SURGERY     RENAL BIOPSY     REVISON OF ARTERIOVENOUS FISTULA Left 01/20/2024   Procedure: REVISON OF ARTERIOVENOUS FISTULA WITH ARTERIAL PLICATION;  Surgeon: Gretta Lonni PARAS, MD;  Location: [facility] OR;  Service: Vascular;  Laterality: Left;   RIGHT/LEFT HEART CATH AND CORONARY ANGIOGRAPHY N/A 09/04/2021   Procedure: RIGHT/LEFT HEART CATH AND CORONARY ANGIOGRAPHY;  Surgeon: Verlin Lonni BIRCH, MD;  Location: [facility]  INVASIVE CV LAB;  Service: Cardiovascular;  Laterality: N/A;   RIGHT/LEFT HEART CATH AND CORONARY ANGIOGRAPHY N/A 08/01/2022   Procedure: RIGHT/LEFT HEART CATH AND CORONARY ANGIOGRAPHY;  Surgeon: Wonda Sharper, MD;  Location: [facility] INVASIVE CV LAB;  Service: Cardiovascular;  Laterality: N/A;   TEE WITHOUT CARDIOVERSION N/A 09/03/2020   Procedure: TRANSESOPHAGEAL ECHOCARDIOGRAM (TEE);  Surgeon: Okey Vina GAILS, MD;  Location: [facility] ENDOSCOPY;  Service: Cardiovascular;  Laterality: N/A;   TEE WITHOUT CARDIOVERSION N/A 06/03/2022   Procedure: TRANSESOPHAGEAL ECHOCARDIOGRAM (TEE);  Surgeon: Loni Soyla LABOR, MD;  Location: [facility] ENDOSCOPY;  Service: Cardiology;  Laterality: N/A;   TEE WITHOUT CARDIOVERSION N/A 08/21/2022   Procedure: TRANSESOPHAGEAL ECHOCARDIOGRAM (TEE);  Surgeon: Maryjane Mt, MD;  Location: [facility] OR;  Service: Open Heart Surgery;  Laterality: N/A;   UPPER GI ENDOSCOPY  04/05/2021    Family History  Problem Relation Age of Onset   Hypertension Mother    Heart failure Mother  Hypertension Father    Alcohol abuse Father    Hypertension Sister    Multiple sclerosis Sister    Hypertension Sister    Hypertension Brother    Heart attack Maternal Grandmother 8   Scoliosis Other    Allergic rhinitis Neg Hx    Angioedema Neg Hx    Asthma Neg Hx    Eczema Neg Hx    Immunodeficiency Neg Hx    Urticaria Neg Hx    Colon cancer Neg Hx    Pancreatic cancer Neg Hx    Esophageal cancer Neg Hx     Social History   Socioeconomic History   Marital status: Single    Spouse name: Not on file   Number of children: 0   Years of education: Not on file   Highest education level: Not on file  Occupational History   Not on file  Tobacco Use   Smoking status: Former    Current packs/day: 0.00    Average packs/day: 1 pack/day for 18.0 years (18.0 ttl pk-yrs)    Types: Cigarettes    Start date: 35    Quit date: 2000    Years since quitting: 25.8   Smokeless tobacco: Never  Vaping Use    Vaping status: Never Used  Substance and Sexual Activity   Alcohol use: Yes    Comment: just special occasions/holidays   Drug use: No   Sexual activity: Not Currently    Partners: Male  Other Topics Concern   Not on file  Social History Narrative   Disability      [place]: none   Social Drivers of Health   Financial Resource Strain: High Risk (07/13/2023)   Overall Financial Resource Strain (CARDIA)    Difficulty of Paying Living Expenses: Hard  Food Insecurity: No Food Insecurity (09/01/2024)   Hunger Vital Sign    Worried About Running Out of Food in the Last Year: Never true    Ran Out of Food in the Last Year: Never true  Transportation Needs: No Transportation Needs (09/01/2024)   PRAPARE - Administrator, Civil Service (Medical): No    Lack of Transportation (Non-Medical): No  Physical Activity: Inactive (07/13/2023)   Exercise Vital Sign    Days of Exercise per Week: 0 days    Minutes of Exercise per Session: 0 min  Stress: No Stress Concern Present (07/13/2023)   [employer] of Occupational Health - Occupational Stress Questionnaire    Feeling of Stress : Not at all  Social Connections: Socially Isolated (07/13/2023)   Social Connection and Isolation Panel    Frequency of Communication with Friends and Family: More than three times a week    Frequency of Social Gatherings with Friends and Family: More than three times a week    Attends Religious Services: Never    Database Administrator or Organizations: No    Attends Banker Meetings: Never    Marital Status: Never married  Intimate Partner Violence: Not At Risk (09/01/2024)   Humiliation, Afraid, Rape, and Kick questionnaire    Fear of Current or Ex-Partner: No    Emotionally Abused: No    Physically Abused: No    Sexually Abused: No    Allergies  Allergen Reactions   Ace Inhibitors Cough and Other (See Comments)    Current Facility-Administered Medications  Medication Dose  Route Frequency Provider Last Rate Last Admin   0.9 %  sodium chloride  infusion  250 mL Intravenous PRN Garrick Christians  N, PA-C       0.9 %  sodium chloride  infusion  250 mL Intravenous PRN Thukkani, Arun K, MD       acetaminophen  (TYLENOL ) tablet 650 mg  650 mg Oral Q6H PRN Newton, Steven J, MD       Or   acetaminophen  (TYLENOL ) suppository 650 mg  650 mg Rectal Q6H PRN Eldonna Elspeth PARAS, MD       allopurinol  (ZYLOPRIM ) tablet 100 mg  100 mg Oral Daily Eldonna Elspeth PARAS, MD   100 mg at 09/03/24 1013   amLODipine  (NORVASC ) tablet 5 mg  5 mg Oral Daily Tobb, Kardie, DO   5 mg at 09/03/24 1013   apixaban  (ELIQUIS ) tablet 5 mg  5 mg Oral BID Fairy Frames, MD   5 mg at 09/03/24 1238   atorvastatin  (LIPITOR) tablet 40 mg  40 mg Oral Daily Garrick Leontine SAILOR, PA-C   40 mg at 09/03/24 1013   bictegravir-emtricitabine -tenofovir  AF (BIKTARVY ) 50-200-25 MG per tablet 1 tablet  1 tablet Oral Daily Eldonna Elspeth PARAS, MD   1 tablet at 09/03/24 1024   cholecalciferol (VITAMIN D3) 25 MCG (1000 UNIT) tablet 1,000 Units  1,000 Units Oral Daily Jerrye Katheryn BROCKS, MD   1,000 Units at 09/03/24 1535   cinacalcet  (SENSIPAR ) tablet 30 mg  30 mg Oral Q supper Macel Jayson PARAS, MD   30 mg at 09/01/24 1657   fluticasone  furoate-vilanterol (BREO ELLIPTA ) 100-25 MCG/ACT 1 puff  1 puff Inhalation Daily Eldonna Elspeth PARAS, MD   1 puff at 09/03/24 0808   furosemide  (LASIX ) injection 40 mg  40 mg Intravenous BID Tobb, Kardie, DO   40 mg at 09/03/24 0747   ipratropium-albuterol  (DUONEB) 0.5-2.5 (3) MG/3ML nebulizer solution 3 mL  3 mL Nebulization Q6H PRN Eldonna Elspeth PARAS, MD       isosorbide  mononitrate (IMDUR ) 24 hr tablet 15 mg  15 mg Oral Daily Joseph, Preetha, MD   15 mg at 09/03/24 1013   levothyroxine  (SYNTHROID ) tablet 25 mcg  25 mcg Oral Q0600 Joseph, Preetha, MD   25 mcg at 09/03/24 0558   metoprolol  tartrate (LOPRESSOR ) tablet 12.5 mg  12.5 mg Oral BID Eldonna Elspeth PARAS, MD   12.5 mg at 09/03/24 1013   mycophenolate  (MYFORTIC) EC tablet 360 mg  360 mg Oral TID Eldonna Elspeth PARAS, MD   360 mg at 09/03/24 1535   ondansetron  (ZOFRAN ) injection 4 mg  4 mg Intravenous Q6H PRN Thukkani, Arun K, MD       pantoprazole  (PROTONIX ) EC tablet 40 mg  40 mg Oral Daily Eldonna Elspeth PARAS, MD   40 mg at 09/03/24 1013   predniSONE  (DELTASONE ) tablet 5 mg  5 mg Oral Q breakfast Eldonna Elspeth PARAS, MD   5 mg at 09/03/24 0746   rOPINIRole  (REQUIP ) tablet 1 mg  1 mg Oral QHS Eldonna Elspeth PARAS, MD   1 mg at 09/02/24 2105   sodium chloride  flush (NS) 0.9 % injection 3 mL  3 mL Intravenous Q12H Eldonna Elspeth PARAS, MD   3 mL at 09/03/24 1027   sodium chloride  flush (NS) 0.9 % injection 3 mL  3 mL Intravenous PRN Eldonna Elspeth PARAS, MD       sodium chloride  flush (NS) 0.9 % injection 3 mL  3 mL Intravenous Q12H Garrick Leontine SAILOR, PA-C   3 mL at 09/03/24 1028   sodium chloride  flush (NS) 0.9 % injection 3 mL  3 mL Intravenous PRN Garrick Leontine SAILOR, PA-C  sodium chloride  flush (NS) 0.9 % injection 3 mL  3 mL Intravenous Q12H Thukkani, Arun K, MD   3 mL at 09/03/24 1027   sodium chloride  flush (NS) 0.9 % injection 3 mL  3 mL Intravenous PRN Thukkani, Arun K, MD       sulfamethoxazole-trimethoprim (BACTRIM DS) 800-160 MG per tablet 1 tablet  1 tablet Oral Q M,W,F Eldonna Elspeth PARAS, MD   1 tablet at 09/02/24 0856   tacrolimus ER (ENVARSUS XR) tablet TB24 8 mg  8 mg Oral Daily Eldonna Elspeth PARAS, MD   8 mg at 09/03/24 1024   And   tacrolimus ER (ENVARSUS XR) tablet 1 mg  1 mg Oral QAC breakfast Eldonna Elspeth PARAS, MD   1 mg at 09/03/24 1027   valGANciclovir (VALCYTE) 450 MG tablet TABS 450 mg  450 mg Oral Q48H Marlee Motto B, MD   450 mg at 09/02/24 1207    PHYSICAL EXAM Vitals:   09/03/24 1029 09/03/24 1120 09/03/24 1200 09/03/24 1609  BP:  (!) 143/99  129/89  Pulse:  (!) 122 79 76  Resp:  18 18 15   Temp:  98.1 F (36.7 C)  97.6 F (36.4 C)  TempSrc:  Oral  Oral  SpO2: 100% 100%  97%  Weight:      Height:       Middle-age man in no  acute distress Regular rate and rhythm Unlabored rhythm Left arm AV fistula with strong thrill  PERTINENT LABORATORY AND RADIOLOGIC DATA  Most recent CBC    Latest Ref Rng & Units 09/03/2024    2:52 AM 09/02/2024    5:56 PM 09/02/2024    5:55 PM  CBC  WBC 4.0 - 10.5 K/uL 5.2     Hemoglobin 13.0 - 17.0 g/dL 89.1  89.4  89.4   Hematocrit 39.0 - 52.0 % 31.9  31.0  31.0   Platelets 150 - 400 K/uL 191        Most recent CMP    Latest Ref Rng & Units 09/03/2024    2:52 AM 09/02/2024    5:56 PM 09/02/2024    5:55 PM  CMP  Glucose 70 - 99 mg/dL 99     BUN 6 - 20 mg/dL 34     Creatinine 9.38 - 1.24 mg/dL 7.31     Sodium 864 - 854 mmol/L 135  137  137   Potassium 3.5 - 5.1 mmol/L 3.5  3.9  4.0   Chloride 98 - 111 mmol/L 102     CO2 22 - 32 mmol/L 22     Calcium  8.9 - 10.3 mg/dL 8.3       Renal function Estimated Creatinine Clearance: 34.2 mL/min (A) (by C-G formula based on SCr of 2.68 mg/dL (H)).  HbA1c, POC (prediabetic range) (%)  Date Value  05/01/2020 5.3 (A)   HbA1c, POC (controlled diabetic range) (%)  Date Value  05/01/2020 5.3   HbA1c POC (<> result, manual entry) (%)  Date Value  05/01/2020 5.3   Hgb A1c MFr Bld (%)  Date Value  08/22/2022 5.6    LDL Cholesterol (Calc)  Date Value Ref Range Status  08/22/2024 44 mg/dL (calc) Final    Comment:    Reference range: <100 . Desirable range <100 mg/dL for primary prevention;   <70 mg/dL for patients with CHD or diabetic patients  with > or = 2 CHD risk factors. SABRA LDL-C is now calculated using the Martin-Hopkins  calculation, which is a validated novel  method providing  better accuracy than the Friedewald equation in the  estimation of LDL-C.  Gladis APPLETHWAITE et al. SANDREA. 7986;689(80): 2061-2068  (http://education.[employer].com/faq/FAQ164)    Direct LDL  Date Value Ref Range Status  08/22/2022 23 0 - 99 mg/dL Final    Comment:    Performed at [facility], [address] [place], [place]  [address]     Debby SAILOR. Magda, MD [facility] Phone Number: [phone] 09/03/2024 4:37 PM   Total time spent on preparing this encounter including chart review, data review, collecting history, examining the patient, and coordinating care: 60 min  Portions of this report may have been transcribed using voice recognition software.  Every effort has been made to ensure accuracy; however, inadvertent computerized transcription errors may still be present.

## 2024-09-03 NOTE — Progress Notes (Signed)
 Progress Note  Patient Name: Albert Eaton Date of Encounter: 09/03/2024  Primary Cardiologist: [name], MD   Subjective   Still short of breath. No overnight events.   Inpatient Medications    Scheduled Meds:  allopurinol   100 mg Oral Daily   amLODipine   5 mg Oral Daily   aspirin  EC  81 mg Oral Daily   atorvastatin   40 mg Oral Daily   bictegravir-emtricitabine -tenofovir  AF  1 tablet Oral Daily   cholecalciferol  400 Units Oral Daily   cinacalcet   30 mg Oral Q supper   enoxaparin  (LOVENOX ) injection  40 mg Subcutaneous Q24H   fluticasone  furoate-vilanterol  1 puff Inhalation Daily   furosemide   40 mg Intravenous BID   isosorbide  mononitrate  15 mg Oral Daily   levothyroxine   25 mcg Oral Q0600   metoprolol  tartrate  12.5 mg Oral BID   mycophenolate  360 mg Oral TID   pantoprazole   40 mg Oral Daily   predniSONE   5 mg Oral Q breakfast   rOPINIRole   1 mg Oral QHS   sodium chloride  flush  3 mL Intravenous Q12H   sodium chloride  flush  3 mL Intravenous Q12H   sodium chloride  flush  3 mL Intravenous Q12H   sulfamethoxazole-trimethoprim  1 tablet Oral Q M,W,F   tacrolimus ER  8 mg Oral Daily   And   tacrolimus ER  1 mg Oral QAC breakfast   valGANciclovir  450 mg Oral Q48H   Continuous Infusions:  sodium chloride      sodium chloride      PRN Meds: sodium chloride , sodium chloride , acetaminophen  **OR** acetaminophen , ipratropium-albuterol , ondansetron  (ZOFRAN ) IV, sodium chloride  flush, sodium chloride  flush, sodium chloride  flush   Vital Signs    Vitals:   09/02/24 2022 09/02/24 2325 09/03/24 0436 09/03/24 0522  BP: 136/85 (!) 158/91 (!) 151/99   Pulse:  81 84   Resp: 18 18 17    Temp: 98.4 F (36.9 C) 98.3 F (36.8 C) 98 F (36.7 C)   TempSrc: Oral Oral Oral   SpO2: 100% 98% 99%   Weight:    91.7 kg  Height:        Intake/Output Summary (Last 24 hours) at 09/03/2024 0733 Last data filed at 09/03/2024 0443 Gross per 24 hour  Intake 260.79 ml  Output  2800 ml  Net -2539.21 ml   Filed Weights   09/01/24 1200 09/02/24 0526 09/03/24 0522  Weight: 94.1 kg 94.1 kg 91.7 kg    Telemetry    NSR with PVCs - Personally Reviewed  ECG    Not done today - Personally Reviewed  Physical Exam   Physical Exam Vitals and nursing note reviewed.  Constitutional:      Appearance: Normal appearance.  HENT:     Head: Normocephalic and atraumatic.  Eyes:     Conjunctiva/sclera: Conjunctivae normal.  Neck:     Comments: Unable to assess JVD Cardiovascular:     Rate and Rhythm: Normal rate and regular rhythm.     Heart sounds: Murmur heard.  Pulmonary:     Breath sounds: No rales.     Comments: Conversational dyspnea  Musculoskeletal:        General: No swelling or tenderness.  Skin:    Coloration: Skin is not jaundiced or pale.  Neurological:     Mental Status: He is alert.      Labs    Chemistry Recent Labs  Lab 08/31/24 1612 09/02/24 0257 09/02/24 1755 09/02/24 1756 09/03/24 0252  NA 137 134* 137 137 135  K 4.3 3.8 4.0 3.9 3.5  CL 109 101  --   --  102  CO2 19* 20*  --   --  22  GLUCOSE 122* 119*  --   --  99  BUN 30* 36*  --   --  34*  CREATININE 2.44* 2.77*  --   --  2.68*  CALCIUM  8.9 8.6*  --   --  8.3*  PROT  --  5.9*  --   --   --   ALBUMIN   --  3.6  --   --   --   AST  --  33  --   --   --   ALT  --  59*  --   --   --   ALKPHOS  --  93  --   --   --   BILITOT  --  0.8  --   --   --   GFRNONAA 30* 26*  --   --  27*  ANIONGAP 9 13  --   --  11     Hematology Recent Labs  Lab 08/31/24 1612 09/02/24 0257 09/02/24 1755 09/02/24 1756 09/03/24 0252  WBC 7.1 6.5  --   --  5.2  RBC 3.27* 3.26*  --   --  3.36*  HGB 10.4* 10.5* 10.5* 10.5* 10.8*  HCT 32.4* 31.5* 31.0* 31.0* 31.9*  MCV 99.1 96.6  --   --  94.9  MCH 31.8 32.2  --   --  32.1  MCHC 32.1 33.3  --   --  33.9  RDW 18.8* 18.3*  --   --  18.0*  PLT 205 190  --   --  191    Cardiac EnzymesNo results for input(s): TROPONINI in the last 168  hours. No results for input(s): TROPIPOC in the last 168 hours.   BNP Recent Labs  Lab 08/31/24 1612  BNP 1,578.9*     DDimer No results for input(s): DDIMER in the last 168 hours.   Radiology    CARDIAC CATHETERIZATION Result Date: 09/02/2024 1.  Fick cardiac output of 13.2 L/min and Fick cardiac index of 6.2 L/min/m with the following hemodynamics:  Right atrial pressure mean of 21 mmHg  Right ventricular pressure 65/19 with an end-diastolic pressure of 31 mmHg  Wedge pressure mean of 29 mmHg with V waves to 48 mmHg  PA pressure 62/31 with a mean of 44 mmHg  PVR 1.1 Woods units  PA pulsatility index of 1.5 Summary: Severely elevated filling pressures; continue aggressive diuresis.   ECHOCARDIOGRAM COMPLETE Result Date: 09/01/2024    ECHOCARDIOGRAM REPORT   Patient Name:   Albert Eaton Date of Exam: 09/01/2024 Medical Rec #:  [id number]       Height:       70.0 in Accession #:    [id number]      Weight:       205.0 lb Date of Birth:  09-23-1966        BSA:          2.109 m Patient Age:    58 years        BP:           166/89 mmHg Patient Gender: M               HR:           88 bpm. Exam Location:  Inpatient Procedure: 2D Echo, Cardiac Doppler  and Color Doppler (Both Spectral and Color            Flow Doppler were utilized during procedure). Indications:    Congestive Heart Failure I50.9  History:        Patient has no prior history of Echocardiogram examinations,                 most recent 03/03/2024.                  Mitral Valve: bioprosthetic valve valve is present in the mitral                 position.  Sonographer:    Jayson Gaskins Referring Phys: STEVEN J NEWTON IMPRESSIONS  1. Left ventricular ejection fraction, by estimation, is 50 to 55%. The left ventricle has low normal function. The left ventricle has no regional wall motion abnormalities. The left ventricular internal cavity size was mildly dilated. There is moderate  concentric left ventricular hypertrophy. Left ventricular  diastolic parameters are indeterminate.  2. Right ventricular systolic function is normal. The right ventricular size is moderately enlarged. There is moderately elevated pulmonary artery systolic pressure. The estimated right ventricular systolic pressure is 47.3 mmHg.  3. Left atrial size was severely dilated.  4. Right atrial size was severely dilated.  5. The mitral valve has been repaired/replaced. Mild mitral valve regurgitation. No evidence of mitral stenosis. There is a bioprosthetic valve present in the mitral position.  6. The tricuspid valve is degenerative. Tricuspid valve regurgitation is severe.  7. The aortic valve has an indeterminant number of cusps. There is mild calcification of the aortic valve. Aortic valve regurgitation is trivial. Aortic valve sclerosis/calcification is present, without any evidence of aortic stenosis.  8. There is mild dilatation of the ascending aorta, measuring 38 mm.  9. The inferior vena cava is dilated in size with <50% respiratory variability, suggesting right atrial pressure of 15 mmHg. FINDINGS  Left Ventricle: Left ventricular ejection fraction, by estimation, is 50 to 55%. The left ventricle has low normal function. The left ventricle has no regional wall motion abnormalities. The left ventricular internal cavity size was mildly dilated. There is moderate concentric left ventricular hypertrophy. Left ventricular diastolic parameters are indeterminate. Right Ventricle: The right ventricular size is moderately enlarged. No increase in right ventricular wall thickness. Right ventricular systolic function is normal. There is moderately elevated pulmonary artery systolic pressure. The tricuspid regurgitant  velocity is 2.84 m/s, and with an assumed right atrial pressure of 15 mmHg, the estimated right ventricular systolic pressure is 47.3 mmHg. Left Atrium: Left atrial size was severely dilated. Right Atrium: Right atrial size was severely dilated. Pericardium: There is  no evidence of pericardial effusion. Presence of epicardial fat layer. Mitral Valve: The mitral valve has been repaired/replaced. Mild mitral valve regurgitation. There is a bioprosthetic valve present in the mitral position. No evidence of mitral valve stenosis. MV peak gradient, 10.4 mmHg. The mean mitral valve gradient is 3.0 mmHg. Tricuspid Valve: The tricuspid valve is degenerative in appearance. Tricuspid valve regurgitation is severe. Aortic Valve: The aortic valve has an indeterminant number of cusps. There is mild calcification of the aortic valve. Aortic valve regurgitation is trivial. Aortic valve sclerosis/calcification is present, without any evidence of aortic stenosis. Aortic valve mean gradient measures 4.0 mmHg. Aortic valve peak gradient measures 4.8 mmHg. Aortic valve area, by VTI measures 4.02 cm. Pulmonic Valve: The pulmonic valve was grossly normal. Pulmonic valve regurgitation is mild. Aorta: The aortic  root is normal in size and structure. There is mild dilatation of the ascending aorta, measuring 38 mm. Venous: The inferior vena cava is dilated in size with less than 50% respiratory variability, suggesting right atrial pressure of 15 mmHg. IAS/Shunts: No atrial level shunt detected by color flow Doppler.  LEFT VENTRICLE PLAX 2D LVIDd:         5.70 cm   Diastology LVIDs:         4.25 cm   LV e' medial:    9.03 cm/s LV PW:         1.40 cm   LV E/e' medial:  16.4 LV IVS:        1.60 cm   LV e' lateral:   10.60 cm/s LVOT diam:     2.00 cm   LV E/e' lateral: 14.0 LV SV:         74 LV SV Index:   35 LVOT Area:     3.14 cm  RIGHT VENTRICLE RV S prime:     9.68 cm/s LEFT ATRIUM              Index        RIGHT ATRIUM           Index LA Vol (A2C):   94.4 ml  44.75 ml/m  RA Area:     34.20 cm LA Vol (A4C):   111.0 ml 52.62 ml/m  RA Volume:   130.00 ml 61.63 ml/m LA Biplane Vol: 105.0 ml 49.78 ml/m  AORTIC VALVE AV Area (Vmax):    3.37 cm AV Area (Vmean):   3.19 cm AV Area (VTI):     4.02 cm  AV Vmax:           110.00 cm/s AV Vmean:          92.400 cm/s AV VTI:            0.185 m AV Peak Grad:      4.8 mmHg AV Mean Grad:      4.0 mmHg LVOT Vmax:         118.00 cm/s LVOT Vmean:        93.900 cm/s LVOT VTI:          0.237 m LVOT/AV VTI ratio: 1.28  AORTA Ao Root diam: 3.50 cm MITRAL VALVE                TRICUSPID VALVE MV Area (PHT): 3.37 cm     TR Peak grad:   32.3 mmHg MV Area VTI:   2.85 cm     TR Vmax:        284.00 cm/s MV Peak grad:  10.4 mmHg MV Mean grad:  3.0 mmHg     SHUNTS MV Vmax:       1.61 m/s     Systemic VTI:  0.24 m MV Vmean:      81.1 cm/s    Systemic Diam: 2.00 cm MV Decel Time: 225 msec MV E velocity: 148.00 cm/s MV A velocity: 85.90 cm/s MV E/A ratio:  1.72 Toribio Fuel MD Electronically signed by Toribio Fuel MD Signature Date/Time: 09/01/2024/11:45:33 PM    Final     Cardiac Studies   Right heart catheterization 09/02/2024: 1.  Fick cardiac output of 13.2 L/min and Fick cardiac index of 6.2 L/min/m with the following hemodynamics:            Right atrial pressure mean of 21 mmHg  Right ventricular pressure 65/19 with an end-diastolic pressure of 31 mmHg            Wedge pressure mean of 29 mmHg with V waves to 48 mmHg            PA pressure 62/31 with a mean of 44 mmHg            PVR 1.1 Woods units            PA pulsatility index of 1.5 Summary: Severely elevated filling pressures; continue aggressive diuresis.  Echocardiogram 09/01/2024:  1. Left ventricular ejection fraction, by estimation, is 50 to 55%. The  left ventricle has low normal function. The left ventricle has no regional  wall motion abnormalities. The left ventricular internal cavity size was  mildly dilated. There is moderate   concentric left ventricular hypertrophy. Left ventricular diastolic  parameters are indeterminate.   2. Right ventricular systolic function is normal. The right ventricular  size is moderately enlarged. There is moderately elevated pulmonary artery   systolic pressure. The estimated right ventricular systolic pressure is  47.3 mmHg.   3. Left atrial size was severely dilated.   4. Right atrial size was severely dilated.   5. The mitral valve has been repaired/replaced. Mild mitral valve  regurgitation. No evidence of mitral stenosis. There is a bioprosthetic  valve present in the mitral position.   6. The tricuspid valve is degenerative. Tricuspid valve regurgitation is  severe.   7. The aortic valve has an indeterminant number of cusps. There is mild  calcification of the aortic valve. Aortic valve regurgitation is trivial.  Aortic valve sclerosis/calcification is present, without any evidence of  aortic stenosis.   8. There is mild dilatation of the ascending aorta, measuring 38 mm.   9. The inferior vena cava is dilated in size with <50% respiratory  variability, suggesting right atrial pressure of 15 mmHg.   Patient Profile     58 y.o. male  with a hx of chronic HFmrEF [ 45-50%] and chronic diastolic heart failure, atrial flutter/fibrillation not on anticoagulation since renal transplant, moderate CAD, hypertension, hypotension with dialysis, pulmonary artery hypertension, ESRD s/p renal transplant 06/2024 at [facility], PEA cardiac arrest during dialysis in 2023, OSA with inspire in place, MR s/p MVR in 2023, asthma, hypothyroidism, hep B and HIV and cardiology was consulted 09/01/2024 for the evaluation of heart failure exacerbation at the request of Elspeth Masters MD.   Patient underwent right heart catheterization 09/02/2024 due to concern for high-output heart failure from AV fistula.    Assessment & Plan   Acute on chronic heart failure with improved ejection fraction, concern for high output HF from AV fistula - RHC shows elevated CO/CI and severely elevated pressures. SVR is 545 dynes. Baseline weight at least 90 kg per discharge summary in May.  Currently on Lasix  40 mg IV twice daily with good output. Recommend continued diuresis  and discussion with vascular surgery.  Coronary artery disease ESRD on HD, s/p renal transplant 06/2024 at [facility]- on Tacrolimus. Patient  Hypertension-  amlodipine  and Imdur  Paroxysmal AF - Eliquis  restarted, on 5 mg BID. On Lopressor  12.5 mg BID HIV      For questions or updates, please contact [facility] Please consult [email or web address] for contact info under        Signed, Emeline Calender, DO 09/03/2024, 7:33 AM

## 2024-09-03 NOTE — Progress Notes (Signed)
   09/03/24 1120  Assess: MEWS Score  Temp 98.1 F (36.7 C)  BP (!) 143/99  MAP (mmHg) 113  Pulse Rate (!) 122  ECG Heart Rate (!) 118  Resp 18  SpO2 100 %  O2 Device Nasal Cannula  Patient Activity (if Appropriate) In chair  O2 Flow Rate (L/min) 1 L/min  Assess: MEWS Score  MEWS Temp 0  MEWS Systolic 0  MEWS Pulse 2  MEWS RR 0  MEWS LOC 0  MEWS Score 2  MEWS Score Color Yellow  Assess: SIRS CRITERIA  SIRS Temperature  0  SIRS Respirations  0  SIRS Pulse 1  SIRS WBC 0  SIRS Score Sum  1   Rechecked within normal limits

## 2024-09-03 NOTE — Plan of Care (Signed)

## 2024-09-03 NOTE — Progress Notes (Signed)
 [facility] Progress Note  Name: Albert Eaton MRN: [id number] DOB: 04-25-1966  Subjective:  He just had a renal transplant 07/07/24 at [facility].  This was complicated by delayed graft function as well as rejection.  He had 2.8 liters UOP over 11/7.  He has been on lasix  40 mg IV BID.  Feels like his abdomen is swollen but a little less so - that is where he builds up fluid.  Right heart cath with severely elevated filling pressures, recommendation to continue aggressive diuresis   Review of systems:  Denies n/v No fevers/chills No chest pain Pain over transplant a few weeks ago - gone now He denies any difficulty urinating     Intake/Output Summary (Last 24 hours) at 09/03/2024 1307 Last data filed at 09/03/2024 1200 Gross per 24 hour  Intake 500.79 ml  Output 3275 ml  Net -2774.21 ml    Vitals:  Vitals:   09/03/24 0809 09/03/24 1029 09/03/24 1120 09/03/24 1200  BP:   (!) 143/99   Pulse:   (!) 122 79  Resp:   18 18  Temp:   98.1 F (36.7 C)   TempSrc:   Oral   SpO2: 98% 100% 100%   Weight:      Height:         Physical Exam:  General adult male in bed in no acute distress HEENT normocephalic atraumatic extraocular movements intact sclera anicteric Neck supple trachea midline Lungs clear to auscultation bilaterally normal work of breathing at rest  Heart S1S2 no rub Abdomen soft nontender non-distended; RLQ transplant incision approximated; non-tender Extremities no pitting edema  Psych normal mood and affect Neuro alert and oriented x 3 provides hx and follows LUE AVF with bruit and thrill    Medications reviewed   Labs:     Latest Ref Rng & Units 09/03/2024    2:52 AM 09/02/2024    5:56 PM 09/02/2024    5:55 PM  BMP  Glucose 70 - 99 mg/dL 99     BUN 6 - 20 mg/dL 34     Creatinine 9.38 - 1.24 mg/dL 7.31     Sodium 864 - 854 mmol/L 135  137  137   Potassium 3.5 - 5.1 mmol/L 3.5  3.9  4.0   Chloride 98 - 111 mmol/L 102     CO2 22 - 32 mmol/L 22      Calcium  8.9 - 10.3 mg/dL 8.3        Assessment/Plan:   Post transplant CKD IV CKD stage IV of his allograft  Most recently appears to be Cr 2.5 - 2.8 S/p renal transplant 07/07/2024 at [facility] complicated by delayed graft function and Acute and Chronic/Active TCMR treated with pulse steroids at the end of October Progressive orthopnea and PND; LVEF ok: ? High output from AVF S/p right heart cath with severely elevated filling pressures and recommendation to continue aggressive diuresis  Immunosuppression  on tac/mmf/pred at home doses  Chronic HIV HTN - improved.   Continue lasix  at 40 mg IV BID for now  OSA - noted; per primary team  CAD - noted  Secondary hyperparathyroidism On vitamin D  - increase to 1000 units daily and if PTH is elevated would transition to calcitriol   Noted patient on cinacalcet    Check intact PTH Hypophosphatemia  Repeat phos in AM   Disposition - continue inpatient monitoring     Katheryn JAYSON Saba, MD 09/03/2024 1:34 PM

## 2024-09-03 NOTE — Progress Notes (Addendum)
 PROGRESS NOTE    Albert Eaton  FMW:[id number] DOB: June 18, 1966 DOA: 08/31/2024 PCP: [name], NP  58/M with chronic systolic CHF, ESRD recent renal transplant on 07/07/2024, HIV, hypertension, hypothyroidism, OSA, CAD, atrial flutter presented to the ED with ongoing dyspnea on exertion and severe orthopnea for 3 months.  Symptoms started prior to his renal transplant and have persisted since, only minimal swelling noted compliant with home antirejection medications.  In the ED vital stable, WBC 7, hemoglobin 14, troponin 40, 50 creatinine 2.4, BNP 1579, chest x-ray with mild bibasilar edema. - Admitted, cards and nephrology consulting, started on diuretics,   Subjective: -Feels a little better, continues to have orthopnea  Assessment and Plan:  Acute on chronic diastolic CHF 2D echo May 2025 with EF of 50 to 55%, moderately elevated PASP, normal RV - Concern for high-output CHF from large AV fistula, RHC yesterday with significantly elevated filling pressures, CO 13.2, cardiac index 6.2, -Diuresing well, 5.8 L negative, continue IV Lasix  40 mg twice daily -Await cards input regarding high output CHF, also check TSH, he is on low-dose Synthroid  -GDMT limited by CKD 4  History of ESRD status post renal transplant Baseline ESRD on hemodialysis status post DDKT July 07, 2024 in the [facility] system On immunosuppressant regimen including Myfortic, tacrolimus, prednisone  CMV ppx: Valcyte x 3 months (EOT 10/06/2024)  PJP ppx:Bactrim x 6 months (EOT 01/12/2025 unless CD4 <200) Appreciate nephrology input  Atrial flutter, unspecified type [facility]) Noted prior history of atrial fibrillation and atrial flutter Was previously on Coumadin  Looks to be on hold from recent renal transplant September 2025 Will start Eliquis  today  CAD (coronary artery disease) Stable, continue aspirin  and Crestor   Obstructive sleep apnea CPAP  Chronic kidney disease, unspecified Creatinine 2.14 today with  noted recent renal transplant September 2025 GFR in the 30s Overall renal function appears to be slowly improving since transplant with most recent creatinine around 3 prior to admission  Arthritis, gouty Continue allopurinol   HIV disease (HCC) Biktarvy  continued, follow-up with infectious disease    DVT prophylaxis: SCDs Code Status: Full code Family Communication: None present Disposition Plan: Home pending improvement in volume status  Consultants:    Procedures:   Antimicrobials:    Objective: Vitals:   09/03/24 0522 09/03/24 0755 09/03/24 0809 09/03/24 1029  BP:  (!) 145/95    Pulse:  87    Resp:      Temp:  97.6 F (36.4 C)    TempSrc:  Oral    SpO2:  98% 98% 100%  Weight: 91.7 kg     Height:        Intake/Output Summary (Last 24 hours) at 09/03/2024 1033 Last data filed at 09/03/2024 1004 Gross per 24 hour  Intake 380.79 ml  Output 3600 ml  Net -3219.21 ml   Filed Weights   09/01/24 1200 09/02/24 0526 09/03/24 0522  Weight: 94.1 kg 94.1 kg 91.7 kg    Examination:  General exam: Appears calm and comfortable  HEENT: Positive JVD Respiratory system: Rare basilar rales Cardiovascular system: S1 & S2 heard, RRR.  Abd: nondistended, soft and nontender.Normal bowel sounds heard. Central nervous system: Alert and oriented. No focal neurological deficits. Extremities: no edema Skin: No rashes Psychiatry:  Mood & affect appropriate.     Data Reviewed:   CBC: Recent Labs  Lab 08/31/24 1612 09/02/24 0257 09/02/24 1755 09/02/24 1756 09/03/24 0252  WBC 7.1 6.5  --   --  5.2  NEUTROABS 5.6  --   --   --   --  HGB 10.4* 10.5* 10.5* 10.5* 10.8*  HCT 32.4* 31.5* 31.0* 31.0* 31.9*  MCV 99.1 96.6  --   --  94.9  PLT 205 190  --   --  191   Basic Metabolic Panel: Recent Labs  Lab 08/31/24 1612 09/01/24 1433 09/02/24 0257 09/02/24 1755 09/02/24 1756 09/03/24 0252  NA 137  --  134* 137 137 135  K 4.3  --  3.8 4.0 3.9 3.5  CL 109  --  101  --    --  102  CO2 19*  --  20*  --   --  22  GLUCOSE 122*  --  119*  --   --  99  BUN 30*  --  36*  --   --  34*  CREATININE 2.44*  --  2.77*  --   --  2.68*  CALCIUM  8.9  --  8.6*  --   --  8.3*  PHOS  --  1.8* 1.9*  --   --   --    GFR: Estimated Creatinine Clearance: 34.2 mL/min (A) (by C-G formula based on SCr of 2.68 mg/dL (H)). Liver Function Tests: Recent Labs  Lab 09/02/24 0257  AST 33  ALT 59*  ALKPHOS 93  BILITOT 0.8  PROT 5.9*  ALBUMIN  3.6   No results for input(s): LIPASE, AMYLASE in the last 168 hours. No results for input(s): AMMONIA in the last 168 hours. Coagulation Profile: No results for input(s): INR, PROTIME in the last 168 hours. Cardiac Enzymes: No results for input(s): CKTOTAL, CKMB, CKMBINDEX, TROPONINI in the last 168 hours. BNP (last 3 results) No results for input(s): PROBNP in the last 8760 hours. HbA1C: No results for input(s): HGBA1C in the last 72 hours. CBG: No results for input(s): GLUCAP in the last 168 hours. Lipid Profile: No results for input(s): CHOL, HDL, LDLCALC, TRIG, CHOLHDL, LDLDIRECT in the last 72 hours. Thyroid  Function Tests: No results for input(s): TSH, T4TOTAL, FREET4, T3FREE, THYROIDAB in the last 72 hours. Anemia Panel: No results for input(s): VITAMINB12, FOLATE, FERRITIN, TIBC, IRON , RETICCTPCT in the last 72 hours. Urine analysis:    Component Value Date/Time   COLORURINE STRAW (A) 08/31/2024 1613   APPEARANCEUR CLEAR 08/31/2024 1613   LABSPEC 1.006 08/31/2024 1613   PHURINE 5.0 08/31/2024 1613   GLUCOSEU NEGATIVE 08/31/2024 1613   HGBUR NEGATIVE 08/31/2024 1613   BILIRUBINUR NEGATIVE 08/31/2024 1613   BILIRUBINUR neg 12/27/2019 0913   KETONESUR NEGATIVE 08/31/2024 1613   PROTEINUR NEGATIVE 08/31/2024 1613   UROBILINOGEN 0.2 12/27/2019 0913   UROBILINOGEN 0.2 01/15/2018 1140   NITRITE NEGATIVE 08/31/2024 1613   LEUKOCYTESUR NEGATIVE 08/31/2024 1613    Sepsis Labs: @LABRCNTIP (procalcitonin:4,lacticidven:4)  )No results found for this or any previous visit (from the past 240 hours).   Radiology Studies: CARDIAC CATHETERIZATION Result Date: 09/02/2024 1.  Fick cardiac output of 13.2 L/min and Fick cardiac index of 6.2 L/min/m with the following hemodynamics:  Right atrial pressure mean of 21 mmHg  Right ventricular pressure 65/19 with an end-diastolic pressure of 31 mmHg  Wedge pressure mean of 29 mmHg with V waves to 48 mmHg  PA pressure 62/31 with a mean of 44 mmHg  PVR 1.1 Woods units  PA pulsatility index of 1.5 Summary: Severely elevated filling pressures; continue aggressive diuresis.   ECHOCARDIOGRAM COMPLETE Result Date: 09/01/2024    ECHOCARDIOGRAM REPORT   Patient Name:   Albert Eaton Date of Exam: 09/01/2024 Medical Rec #:  [id number]  Height:       70.0 in Accession #:    [id number]      Weight:       205.0 lb Date of Birth:  05/15/1966        BSA:          2.109 m Patient Age:    58 years        BP:           166/89 mmHg Patient Gender: M               HR:           88 bpm. Exam Location:  Inpatient Procedure: 2D Echo, Cardiac Doppler and Color Doppler (Both Spectral and Color            Flow Doppler were utilized during procedure). Indications:    Congestive Heart Failure I50.9  History:        Patient has no prior history of Echocardiogram examinations,                 most recent 03/03/2024.                  Mitral Valve: bioprosthetic valve valve is present in the mitral                 position.  Sonographer:    Jayson Gaskins Referring Phys: STEVEN J NEWTON IMPRESSIONS  1. Left ventricular ejection fraction, by estimation, is 50 to 55%. The left ventricle has low normal function. The left ventricle has no regional wall motion abnormalities. The left ventricular internal cavity size was mildly dilated. There is moderate  concentric left ventricular hypertrophy. Left ventricular diastolic parameters are indeterminate.  2. Right  ventricular systolic function is normal. The right ventricular size is moderately enlarged. There is moderately elevated pulmonary artery systolic pressure. The estimated right ventricular systolic pressure is 47.3 mmHg.  3. Left atrial size was severely dilated.  4. Right atrial size was severely dilated.  5. The mitral valve has been repaired/replaced. Mild mitral valve regurgitation. No evidence of mitral stenosis. There is a bioprosthetic valve present in the mitral position.  6. The tricuspid valve is degenerative. Tricuspid valve regurgitation is severe.  7. The aortic valve has an indeterminant number of cusps. There is mild calcification of the aortic valve. Aortic valve regurgitation is trivial. Aortic valve sclerosis/calcification is present, without any evidence of aortic stenosis.  8. There is mild dilatation of the ascending aorta, measuring 38 mm.  9. The inferior vena cava is dilated in size with <50% respiratory variability, suggesting right atrial pressure of 15 mmHg. FINDINGS  Left Ventricle: Left ventricular ejection fraction, by estimation, is 50 to 55%. The left ventricle has low normal function. The left ventricle has no regional wall motion abnormalities. The left ventricular internal cavity size was mildly dilated. There is moderate concentric left ventricular hypertrophy. Left ventricular diastolic parameters are indeterminate. Right Ventricle: The right ventricular size is moderately enlarged. No increase in right ventricular wall thickness. Right ventricular systolic function is normal. There is moderately elevated pulmonary artery systolic pressure. The tricuspid regurgitant  velocity is 2.84 m/s, and with an assumed right atrial pressure of 15 mmHg, the estimated right ventricular systolic pressure is 47.3 mmHg. Left Atrium: Left atrial size was severely dilated. Right Atrium: Right atrial size was severely dilated. Pericardium: There is no evidence of pericardial effusion. Presence of  epicardial fat layer. Mitral Valve: The mitral valve has been repaired/replaced. Mild  mitral valve regurgitation. There is a bioprosthetic valve present in the mitral position. No evidence of mitral valve stenosis. MV peak gradient, 10.4 mmHg. The mean mitral valve gradient is 3.0 mmHg. Tricuspid Valve: The tricuspid valve is degenerative in appearance. Tricuspid valve regurgitation is severe. Aortic Valve: The aortic valve has an indeterminant number of cusps. There is mild calcification of the aortic valve. Aortic valve regurgitation is trivial. Aortic valve sclerosis/calcification is present, without any evidence of aortic stenosis. Aortic valve mean gradient measures 4.0 mmHg. Aortic valve peak gradient measures 4.8 mmHg. Aortic valve area, by VTI measures 4.02 cm. Pulmonic Valve: The pulmonic valve was grossly normal. Pulmonic valve regurgitation is mild. Aorta: The aortic root is normal in size and structure. There is mild dilatation of the ascending aorta, measuring 38 mm. Venous: The inferior vena cava is dilated in size with less than 50% respiratory variability, suggesting right atrial pressure of 15 mmHg. IAS/Shunts: No atrial level shunt detected by color flow Doppler.  LEFT VENTRICLE PLAX 2D LVIDd:         5.70 cm   Diastology LVIDs:         4.25 cm   LV e' medial:    9.03 cm/s LV PW:         1.40 cm   LV E/e' medial:  16.4 LV IVS:        1.60 cm   LV e' lateral:   10.60 cm/s LVOT diam:     2.00 cm   LV E/e' lateral: 14.0 LV SV:         74 LV SV Index:   35 LVOT Area:     3.14 cm  RIGHT VENTRICLE RV S prime:     9.68 cm/s LEFT ATRIUM              Index        RIGHT ATRIUM           Index LA Vol (A2C):   94.4 ml  44.75 ml/m  RA Area:     34.20 cm LA Vol (A4C):   111.0 ml 52.62 ml/m  RA Volume:   130.00 ml 61.63 ml/m LA Biplane Vol: 105.0 ml 49.78 ml/m  AORTIC VALVE AV Area (Vmax):    3.37 cm AV Area (Vmean):   3.19 cm AV Area (VTI):     4.02 cm AV Vmax:           110.00 cm/s AV Vmean:           92.400 cm/s AV VTI:            0.185 m AV Peak Grad:      4.8 mmHg AV Mean Grad:      4.0 mmHg LVOT Vmax:         118.00 cm/s LVOT Vmean:        93.900 cm/s LVOT VTI:          0.237 m LVOT/AV VTI ratio: 1.28  AORTA Ao Root diam: 3.50 cm MITRAL VALVE                TRICUSPID VALVE MV Area (PHT): 3.37 cm     TR Peak grad:   32.3 mmHg MV Area VTI:   2.85 cm     TR Vmax:        284.00 cm/s MV Peak grad:  10.4 mmHg MV Mean grad:  3.0 mmHg     SHUNTS MV Vmax:       1.61 m/s  Systemic VTI:  0.24 m MV Vmean:      81.1 cm/s    Systemic Diam: 2.00 cm MV Decel Time: 225 msec MV E velocity: 148.00 cm/s MV A velocity: 85.90 cm/s MV E/A ratio:  1.72 Toribio Fuel MD Electronically signed by Toribio Fuel MD Signature Date/Time: 09/01/2024/11:45:33 PM    Final      Scheduled Meds:  allopurinol   100 mg Oral Daily   amLODipine   5 mg Oral Daily   aspirin  EC  81 mg Oral Daily   atorvastatin   40 mg Oral Daily   bictegravir-emtricitabine -tenofovir  AF  1 tablet Oral Daily   cholecalciferol  400 Units Oral Daily   cinacalcet   30 mg Oral Q supper   enoxaparin  (LOVENOX ) injection  40 mg Subcutaneous Q24H   fluticasone  furoate-vilanterol  1 puff Inhalation Daily   furosemide   40 mg Intravenous BID   isosorbide  mononitrate  15 mg Oral Daily   levothyroxine   25 mcg Oral Q0600   metoprolol  tartrate  12.5 mg Oral BID   mycophenolate  360 mg Oral TID   pantoprazole   40 mg Oral Daily   predniSONE   5 mg Oral Q breakfast   rOPINIRole   1 mg Oral QHS   sodium chloride  flush  3 mL Intravenous Q12H   sodium chloride  flush  3 mL Intravenous Q12H   sodium chloride  flush  3 mL Intravenous Q12H   sulfamethoxazole-trimethoprim  1 tablet Oral Q M,W,F   tacrolimus ER  8 mg Oral Daily   And   tacrolimus ER  1 mg Oral QAC breakfast   valGANciclovir  450 mg Oral Q48H   Continuous Infusions:  sodium chloride      sodium chloride        LOS: 1 day    Time spent:    Sigurd Pac, MD [facility]   09/03/2024, 10:33 AM

## 2024-09-04 ENCOUNTER — Encounter (HOSPITAL_COMMUNITY): Payer: Self-pay | Admitting: Internal Medicine

## 2024-09-04 ENCOUNTER — Inpatient Hospital Stay (HOSPITAL_COMMUNITY)

## 2024-09-04 DIAGNOSIS — I509 Heart failure, unspecified: Secondary | ICD-10-CM | POA: Diagnosis not present

## 2024-09-04 DIAGNOSIS — I5083 High output heart failure: Secondary | ICD-10-CM | POA: Diagnosis not present

## 2024-09-04 DIAGNOSIS — I5033 Acute on chronic diastolic (congestive) heart failure: Secondary | ICD-10-CM | POA: Diagnosis not present

## 2024-09-04 DIAGNOSIS — T8611 Kidney transplant rejection: Secondary | ICD-10-CM | POA: Diagnosis not present

## 2024-09-04 DIAGNOSIS — N186 End stage renal disease: Secondary | ICD-10-CM | POA: Diagnosis not present

## 2024-09-04 DIAGNOSIS — N184 Chronic kidney disease, stage 4 (severe): Secondary | ICD-10-CM | POA: Diagnosis not present

## 2024-09-04 LAB — BASIC METABOLIC PANEL WITH GFR
Anion gap: 14 (ref 5–15)
BUN: 30 mg/dL — ABNORMAL HIGH (ref 6–20)
CO2: 22 mmol/L (ref 22–32)
Calcium: 8.7 mg/dL — ABNORMAL LOW (ref 8.9–10.3)
Chloride: 100 mmol/L (ref 98–111)
Creatinine, Ser: 2.61 mg/dL — ABNORMAL HIGH (ref 0.61–1.24)
GFR, Estimated: 28 mL/min — ABNORMAL LOW (ref >60)
Glucose, Bld: 116 mg/dL — ABNORMAL HIGH (ref 70–99)
Potassium: 3.4 mmol/L — ABNORMAL LOW (ref 3.5–5.1)
Sodium: 136 mmol/L (ref 135–145)

## 2024-09-04 LAB — TSH: TSH: 2.552 u[IU]/mL (ref 0.350–4.500)

## 2024-09-04 LAB — CBC
HCT: 32.1 % — ABNORMAL LOW (ref 39.0–52.0)
Hemoglobin: 10.8 g/dL — ABNORMAL LOW (ref 13.0–17.0)
MCH: 32.2 pg (ref 26.0–34.0)
MCHC: 33.6 g/dL (ref 30.0–36.0)
MCV: 95.8 fL (ref 80.0–100.0)
Platelets: 192 K/uL (ref 150–400)
RBC: 3.35 MIL/uL — ABNORMAL LOW (ref 4.22–5.81)
RDW: 17.5 % — ABNORMAL HIGH (ref 11.5–15.5)
WBC: 6.1 K/uL (ref 4.0–10.5)
nRBC: 0 % (ref 0.0–0.2)

## 2024-09-04 LAB — PHOSPHORUS: Phosphorus: 2.1 mg/dL — ABNORMAL LOW (ref 2.5–4.6)

## 2024-09-04 MED ORDER — POTASSIUM & SODIUM PHOSPHATES 280-160-250 MG PO PACK
1.0000 | PACK | Freq: Once | ORAL | Status: AC
  Administered 2024-09-04: 1 via ORAL
  Filled 2024-09-04: qty 1

## 2024-09-04 NOTE — Progress Notes (Signed)
 Progress Note  Patient Name: Albert Eaton Date of Encounter: 09/04/2024  Primary Cardiologist: [name], MD   Subjective   Doing a bit better today but not at baseline. Currently off O2.   Inpatient Medications    Scheduled Meds:  allopurinol   100 mg Oral Daily   amLODipine   5 mg Oral Daily   apixaban   5 mg Oral BID   atorvastatin   40 mg Oral Daily   bictegravir-emtricitabine -tenofovir  AF  1 tablet Oral Daily   cholecalciferol  1,000 Units Oral Daily   cinacalcet   30 mg Oral Q supper   fluticasone  furoate-vilanterol  1 puff Inhalation Daily   furosemide   40 mg Intravenous BID   isosorbide  mononitrate  15 mg Oral Daily   levothyroxine   25 mcg Oral Q0600   metoprolol  tartrate  12.5 mg Oral BID   mycophenolate  360 mg Oral TID   pantoprazole   40 mg Oral Daily   potassium & sodium phosphates   1 packet Oral Once   predniSONE   5 mg Oral Q breakfast   rOPINIRole   1 mg Oral QHS   sodium chloride  flush  3 mL Intravenous Q12H   sodium chloride  flush  3 mL Intravenous Q12H   sodium chloride  flush  3 mL Intravenous Q12H   sulfamethoxazole-trimethoprim  1 tablet Oral Q M,W,F   tacrolimus ER  8 mg Oral Daily   And   tacrolimus ER  1 mg Oral QAC breakfast   valGANciclovir  450 mg Oral Q48H   Continuous Infusions:   PRN Meds: acetaminophen  **OR** acetaminophen , ipratropium-albuterol , ondansetron  (ZOFRAN ) IV, sodium chloride  flush, sodium chloride  flush, sodium chloride  flush   Vital Signs    Vitals:   09/04/24 0016 09/04/24 0446 09/04/24 0842 09/04/24 0929  BP: (!) 141/94 (!) 146/94 129/82   Pulse: 78 81 77 77  Resp: 18 20 20 18   Temp: 98.5 F (36.9 C) 98.4 F (36.9 C) 97.9 F (36.6 C)   TempSrc: Oral Oral Oral   SpO2:  99% 98%   Weight:  90.3 kg    Height:        Intake/Output Summary (Last 24 hours) at 09/04/2024 1017 Last data filed at 09/04/2024 0844 Gross per 24 hour  Intake 840 ml  Output 2415 ml  Net -1575 ml   Filed Weights   09/02/24 0526  09/03/24 0522 09/04/24 0446  Weight: 94.1 kg 91.7 kg 90.3 kg    Telemetry    NSR with PVCs - Personally Reviewed  ECG    Not done today - Personally Reviewed  Physical Exam   Physical Exam Vitals and nursing note reviewed.  Constitutional:      Appearance: Normal appearance.  HENT:     Head: Normocephalic and atraumatic.  Eyes:     Conjunctiva/sclera: Conjunctivae normal.  Cardiovascular:     Rate and Rhythm: Normal rate and regular rhythm.  Pulmonary:     Effort: Pulmonary effort is normal.     Breath sounds: Examination of the right-lower field reveals rales. Examination of the left-lower field reveals rales. Rales present.  Musculoskeletal:        General: No swelling or tenderness.  Skin:    Coloration: Skin is not jaundiced or pale.  Neurological:     Mental Status: He is alert.      Labs    Chemistry Recent Labs  Lab 09/02/24 0257 09/02/24 1755 09/02/24 1756 09/03/24 0252 09/04/24 0221  NA 134*   < > 137 135 136  K 3.8   < >  3.9 3.5 3.4*  CL 101  --   --  102 100  CO2 20*  --   --  22 22  GLUCOSE 119*  --   --  99 116*  BUN 36*  --   --  34* 30*  CREATININE 2.77*  --   --  2.68* 2.61*  CALCIUM  8.6*  --   --  8.3* 8.7*  PROT 5.9*  --   --   --   --   ALBUMIN  3.6  --   --   --   --   AST 33  --   --   --   --   ALT 59*  --   --   --   --   ALKPHOS 93  --   --   --   --   BILITOT 0.8  --   --   --   --   GFRNONAA 26*  --   --  27* 28*  ANIONGAP 13  --   --  11 14   < > = values in this interval not displayed.     Hematology Recent Labs  Lab 09/02/24 0257 09/02/24 1755 09/02/24 1756 09/03/24 0252 09/04/24 0221  WBC 6.5  --   --  5.2 6.1  RBC 3.26*  --   --  3.36* 3.35*  HGB 10.5*   < > 10.5* 10.8* 10.8*  HCT 31.5*   < > 31.0* 31.9* 32.1*  MCV 96.6  --   --  94.9 95.8  MCH 32.2  --   --  32.1 32.2  MCHC 33.3  --   --  33.9 33.6  RDW 18.3*  --   --  18.0* 17.5*  PLT 190  --   --  191 192   < > = values in this interval not displayed.     Cardiac EnzymesNo results for input(s): TROPONINI in the last 168 hours. No results for input(s): TROPIPOC in the last 168 hours.   BNP Recent Labs  Lab 08/31/24 1612  BNP 1,578.9*     DDimer No results for input(s): DDIMER in the last 168 hours.   Radiology    CARDIAC CATHETERIZATION Result Date: 09/02/2024 1.  Fick cardiac output of 13.2 L/min and Fick cardiac index of 6.2 L/min/m with the following hemodynamics:  Right atrial pressure mean of 21 mmHg  Right ventricular pressure 65/19 with an end-diastolic pressure of 31 mmHg  Wedge pressure mean of 29 mmHg with V waves to 48 mmHg  PA pressure 62/31 with a mean of 44 mmHg  PVR 1.1 Woods units  PA pulsatility index of 1.5 Summary: Severely elevated filling pressures; continue aggressive diuresis.    Cardiac Studies   Right heart catheterization 09/02/2024: 1.  Fick cardiac output of 13.2 L/min and Fick cardiac index of 6.2 L/min/m with the following hemodynamics:            Right atrial pressure mean of 21 mmHg            Right ventricular pressure 65/19 with an end-diastolic pressure of 31 mmHg            Wedge pressure mean of 29 mmHg with V waves to 48 mmHg            PA pressure 62/31 with a mean of 44 mmHg            PVR 1.1 Woods units  PA pulsatility index of 1.5 Summary: Severely elevated filling pressures; continue aggressive diuresis.  Echocardiogram 09/01/2024:  1. Left ventricular ejection fraction, by estimation, is 50 to 55%. The  left ventricle has low normal function. The left ventricle has no regional  wall motion abnormalities. The left ventricular internal cavity size was  mildly dilated. There is moderate   concentric left ventricular hypertrophy. Left ventricular diastolic  parameters are indeterminate.   2. Right ventricular systolic function is normal. The right ventricular  size is moderately enlarged. There is moderately elevated pulmonary artery  systolic pressure. The estimated  right ventricular systolic pressure is  47.3 mmHg.   3. Left atrial size was severely dilated.   4. Right atrial size was severely dilated.   5. The mitral valve has been repaired/replaced. Mild mitral valve  regurgitation. No evidence of mitral stenosis. There is a bioprosthetic  valve present in the mitral position.   6. The tricuspid valve is degenerative. Tricuspid valve regurgitation is  severe.   7. The aortic valve has an indeterminant number of cusps. There is mild  calcification of the aortic valve. Aortic valve regurgitation is trivial.  Aortic valve sclerosis/calcification is present, without any evidence of  aortic stenosis.   8. There is mild dilatation of the ascending aorta, measuring 38 mm.   9. The inferior vena cava is dilated in size with <50% respiratory  variability, suggesting right atrial pressure of 15 mmHg.   Patient Profile     58 y.o. male  with a hx of chronic HFmrEF [ 45-50%] and chronic diastolic heart failure, atrial flutter/fibrillation not on anticoagulation since renal transplant, moderate CAD, hypertension, hypotension with dialysis, pulmonary artery hypertension, ESRD s/p renal transplant 06/2024 at [facility] complicated by delayed graft function and rejection, PEA cardiac arrest during dialysis in 2023, OSA with inspire in place, MR s/p MVR in 2023, asthma, hypothyroidism, hep B and HIV and cardiology was consulted 09/01/2024 for the evaluation of heart failure exacerbation at the request of Elspeth Masters MD.   Patient underwent right heart catheterization 09/02/2024 due to concern for high-output heart failure from AV fistula.    Assessment & Plan   Acute on chronic heart failure secondary to high output HF from LUE AV fistula - RHC shows elevated CO/CI and severely elevated pressures. SVR is 545 dynes. Continue on Lasix  40 mg IV twice daily. Vascular surgery has been consulted and I personally discussed the case with Dr. Magda as there is some question as to  whether or not to proceed with banding versus ligation of the fistula.  Per Dr. Cleda experience, banding has a limited success rate.  After further discussion, I agree that the best plan forward would to be to pursue ligation of the AV fistula.  This is supported by a systematic review on the ligation of AV fistulas in high-output heart failure and transplant patients PMID: [id number]. Coronary artery disease ESRD on HD, s/p renal transplant 06/2024 at [facility] complicated by delayed graft function and rejection- on Tacrolimus.  Nephrology on board Hypertension-  amlodipine  and Imdur  Paroxysmal AF - Eliquis  restarted, now on 5 mg BID. On Lopressor  12.5 mg BID HIV      For questions or updates, please contact [facility] Please consult [email or web address] for contact info under        Signed, Emeline Calender, DO 09/04/2024, 10:17 AM

## 2024-09-04 NOTE — Progress Notes (Signed)
 [facility] [place] PROGRESS NOTE  ASSESSMENT / PLAN: Albert Eaton is a 58 y.o. male with: 1) large left brachiobasilic AVF with high flow rate (>2.3L/m in 2019 by duplex) 2) CKD IV after kidney transplantation 07/23/24 at [facility] 2/2 rejection and delayed graft function 3) High output heart failure by right heart cath 09/02/24   Discussed in detail with Dr. Jerrye and Dr. Kriste. Plan ligation of AVF Tuesday.   SUBJECTIVE: No complaints. Reviewed plan with patient.  OBJECTIVE: BP (!) 149/93 (BP Location: Right Arm)   Pulse 76   Temp 98.5 F (36.9 C) (Oral)   Resp 20   Ht 5' 10 (1.778 m)   Wt 90.3 kg   SpO2 98%   BMI 28.55 kg/m   Intake/Output Summary (Last 24 hours) at 09/04/2024 2036 Last data filed at 09/04/2024 1700 Gross per 24 hour  Intake 720 ml  Output 1776 ml  Net -1056 ml    No distress LUE AVF with strong thrill     Latest Ref Rng & Units 09/04/2024    2:21 AM 09/03/2024    2:52 AM 09/02/2024    5:56 PM  CBC  WBC 4.0 - 10.5 K/uL 6.1  5.2    Hemoglobin 13.0 - 17.0 g/dL 89.1  89.1  89.4   Hematocrit 39.0 - 52.0 % 32.1  31.9  31.0   Platelets 150 - 400 K/uL 192  191          Latest Ref Rng & Units 09/04/2024    2:21 AM 09/03/2024    2:52 AM 09/02/2024    5:56 PM  CMP  Glucose 70 - 99 mg/dL 883  99    BUN 6 - 20 mg/dL 30  34    Creatinine 9.38 - 1.24 mg/dL 7.38  7.31    Sodium 864 - 145 mmol/L 136  135  137   Potassium 3.5 - 5.1 mmol/L 3.4  3.5  3.9   Chloride 98 - 111 mmol/L 100  102    CO2 22 - 32 mmol/L 22  22    Calcium  8.9 - 10.3 mg/dL 8.7  8.3      Estimated Creatinine Clearance: 34.9 mL/min (A) (by C-G formula based on SCr of 2.61 mg/dL (H)).  Albert Eaton. Magda, MD [facility] [place] [facility] Phone Number: [phone] 09/04/2024 8:36 PM

## 2024-09-04 NOTE — Progress Notes (Signed)
 [facility] Progress Note Patient: Albert Eaton FMW:[id number] DOB: 1965/12/02  DOA: 08/31/2024 DOS: the patient was seen and examined on 09/04/2024  Brief Hospital Course: 58/M with chronic systolic CHF, ESRD recent renal transplant on 07/07/2024, HIV, hypertension, hypothyroidism, OSA, CAD, atrial flutter presented to the ED with ongoing dyspnea on exertion and severe orthopnea for 3 months.  Symptoms started prior to his renal transplant and have persisted since, only minimal swelling noted compliant with home antirejection medications.  In the ED vital stable, WBC 7, hemoglobin 14, troponin 40, 50 creatinine 2.4, BNP 1579, chest x-ray with mild bibasilar edema. - Admitted, cards and nephrology consulting, started on diuretics,   Assessment and Plan: Acute on chronic diastolic CHF 2D echo May 2025 with EF of 50 to 55%, moderately elevated PASP, normal RV Concern for high-output CHF from large AV fistula,  RHC with significantly elevated filling pressures, CO 13.2, cardiac index 6.2, Diuresing well, Cardiology recommended fistula ligation.  Vascular surgery arranging the procedure on Tuesday.   History of ESRD status post renal transplant Baseline ESRD on hemodialysis status post DDKT July 07, 2024 in the [facility] system On immunosuppressant regimen including Myfortic, tacrolimus, prednisone  CMV ppx: Valcyte x 3 months (EOT 10/06/2024)  PJP ppx:Bactrim x 6 months (EOT 01/12/2025 unless CD4 <200) Appreciate nephrology input   Atrial flutter, unspecified type [facility]) Noted prior history of atrial fibrillation and atrial flutter Was previously on Coumadin  Looks to be on hold from recent renal transplant September 2025 On anticoagulation.   CAD (coronary artery disease) Stable, continue aspirin  and Crestor    Obstructive sleep apnea CPAP   Chronic kidney disease, stage IV Creatinine continues to trend upward Baseline appears to be 2.5-2.8. Prior history of renal transplant.  May  require dialysis. Has AV fistula.  Which will be ligated for high-output failure. Renal transplant September 2025 at [facility] complicated by delayed graft function. On steroids   Arthritis, gouty Continue allopurinol    HIV disease (HCC) Biktarvy  continued, follow-up with infectious disease  Subjective: No nausea no vomiting no fever no chills.  Ongoing shortness of breath and fatigue.  Physical Exam: Basal crackles. S1-S2 present Bowel sounds present Trace edema.  Data Reviewed: I have Reviewed nursing notes, Vitals, and Lab results. Since last encounter, pertinent lab results CBC and BMP   . I have ordered test including CBC and BMP  . I have discussed pt's care plan and test results with nephrology and vascular surgery  .   Disposition: Status is: Inpatient Remains inpatient appropriate because: Monitor for improvement in heart failure apixaban  (ELIQUIS ) tablet 5 mg   Family Communication: No one at bedside Level of care: Telemetry   Vitals:   09/04/24 0842 09/04/24 0929 09/04/24 1300 09/04/24 1638  BP: 129/82  137/86 (!) 149/93  Pulse: 77 77 76 76  Resp: 20 18 20 20   Temp: 97.9 F (36.6 C)  98.5 F (36.9 C) 98.5 F (36.9 C)  TempSrc: Oral  Oral Oral  SpO2: 98%  97% 98%  Weight:      Height:         Author: Yetta Blanch, MD 09/04/2024 4:45 PM  Please look on [email or web address] to find out who is on call.

## 2024-09-04 NOTE — Progress Notes (Signed)
 VASCULAR LAB    Upper extremity duplex of AVF has been performed.  See CV proc for preliminary results.   Gerber Penza, RVT 09/04/2024, 12:30 PM

## 2024-09-04 NOTE — Plan of Care (Signed)

## 2024-09-04 NOTE — Progress Notes (Signed)
 [facility] Progress Note  Name: Albert Eaton MRN: [id number] DOB: 20-Dec-1965  Subjective:  He just had a renal transplant 07/07/24 at [facility].  This was complicated by delayed graft function as well as rejection.  (Required HD).  He had 3.2 liters UOP over 11/8.  He has been on lasix  40 mg IV BID.  Abdomen is where he builds up fluid.    Recent right heart cath with severely elevated filling pressures, recommendation to continue aggressive diuresis.  He feels like his breathing may be a little better than when he came in but he still had to stop washing up this morning and sit down to rest.   Spoke with patient and with vascular surgery.  The patient is willing to do whatever he needs to do to the fistula if this would be felt to help his heart.  He states he had surgery on his AVF a few months before transplant for overlying ulcerations.    Review of systems:    He is short of breath as above  Denies n/v No fevers/chills No chest pain No pain over transplant He denies any difficulty urinating     Intake/Output Summary (Last 24 hours) at 09/04/2024 0902 Last data filed at 09/04/2024 0844 Gross per 24 hour  Intake 840 ml  Output 3165 ml  Net -2325 ml    Vitals:  Vitals:   09/03/24 2208 09/04/24 0016 09/04/24 0446 09/04/24 0842  BP: (!) 145/97 (!) 141/94 (!) 146/94 129/82  Pulse: 75 78 81 77  Resp:  18 20 20   Temp:  98.5 F (36.9 C) 98.4 F (36.9 C) 97.9 F (36.6 C)  TempSrc:  Oral Oral Oral  SpO2:   99% 98%  Weight:   90.3 kg   Height:         Physical Exam:    General adult male in bed in no acute distress HEENT normocephalic atraumatic extraocular movements intact sclera anicteric Neck supple trachea midline Lungs clear to auscultation bilaterally; normal work of breathing at rest and somewhat increased with exertion  Heart S1S2 no rub Abdomen soft nontender non-distended; RLQ transplant incision approximated; non-tender Extremities no pitting edema   Psych normal mood and affect Neuro alert and oriented x 3 provides hx and follows LUE AVF with bruit and thrill    Medications reviewed   Labs:     Latest Ref Rng & Units 09/04/2024    2:21 AM 09/03/2024    2:52 AM 09/02/2024    5:56 PM  BMP  Glucose 70 - 99 mg/dL 883  99    BUN 6 - 20 mg/dL 30  34    Creatinine 9.38 - 1.24 mg/dL 7.38  7.31    Sodium 864 - 145 mmol/L 136  135  137   Potassium 3.5 - 5.1 mmol/L 3.4  3.5  3.9   Chloride 98 - 111 mmol/L 100  102    CO2 22 - 32 mmol/L 22  22    Calcium  8.9 - 10.3 mg/dL 8.7  8.3       Assessment/Plan:   Post transplant CKD IV CKD stage IV of his allograft with recent treated rejection as below  Most recently appears to be Cr 2.5 - 2.8 S/p renal transplant 07/07/2024 at [facility] complicated by delayed graft function and Acute and Chronic/Active TCMR treated with pulse steroids at the end of October Progressive orthopnea and PND; LVEF ok: ? High output from AVF S/p right heart cath with severely elevated filling  pressures and recommendation to continue aggressive diuresis  Continue lasix  40 mg IV BID Spoke with patient and vascular surgery.  The patient would be willing to do whatever would be felt to help his heart, including ligation of the AVF if needed.  I let him know that I would defer to cardiology recommendation   Immunosuppression  on tac/mmf/pred at home doses  Chronic HIV - noted  HTN - improved on current regimen  OSA - noted; per primary team  CAD - noted  Secondary hyperparathyroidism On vitamin D  1000 units daily and if PTH is elevated would transition to calcitriol   Noted patient on cinacalcet , as well  Check intact PTH Normocytic Anemia Stable at 10.8  Hypophosphatemia  Improved  Gently replete once with potassium phosphate x 1    Disposition - continue inpatient monitoring     Albert JAYSON Saba, MD 09/04/2024 9:34 AM

## 2024-09-04 NOTE — Plan of Care (Signed)
  Problem: Education: Goal: Knowledge of General Education information will improve Description: Including pain rating scale, medication(s)/side effects and non-pharmacologic comfort measures Outcome: Progressing   Problem: Health Behavior/Discharge Planning: Goal: Ability to manage health-related needs will improve Outcome: Progressing   Problem: Clinical Measurements: Goal: Will remain free from infection Outcome: Progressing Goal: Cardiovascular complication will be avoided Outcome: Progressing   Problem: Safety: Goal: Ability to remain free from injury will improve Outcome: Progressing   Problem: Skin Integrity: Goal: Risk for impaired skin integrity will decrease Outcome: Progressing

## 2024-09-05 DIAGNOSIS — I5033 Acute on chronic diastolic (congestive) heart failure: Secondary | ICD-10-CM | POA: Diagnosis not present

## 2024-09-05 DIAGNOSIS — I5083 High output heart failure: Secondary | ICD-10-CM | POA: Diagnosis not present

## 2024-09-05 DIAGNOSIS — T8611 Kidney transplant rejection: Secondary | ICD-10-CM | POA: Diagnosis not present

## 2024-09-05 DIAGNOSIS — N184 Chronic kidney disease, stage 4 (severe): Secondary | ICD-10-CM | POA: Diagnosis not present

## 2024-09-05 LAB — BASIC METABOLIC PANEL WITH GFR
Anion gap: 10 (ref 5–15)
BUN: 31 mg/dL — ABNORMAL HIGH (ref 6–20)
CO2: 24 mmol/L (ref 22–32)
Calcium: 8.2 mg/dL — ABNORMAL LOW (ref 8.9–10.3)
Chloride: 101 mmol/L (ref 98–111)
Creatinine, Ser: 2.89 mg/dL — ABNORMAL HIGH (ref 0.61–1.24)
GFR, Estimated: 24 mL/min — ABNORMAL LOW (ref >60)
Glucose, Bld: 104 mg/dL — ABNORMAL HIGH (ref 70–99)
Potassium: 3.4 mmol/L — ABNORMAL LOW (ref 3.5–5.1)
Sodium: 135 mmol/L (ref 135–145)

## 2024-09-05 LAB — CBC
HCT: 34 % — ABNORMAL LOW (ref 39.0–52.0)
Hemoglobin: 11.4 g/dL — ABNORMAL LOW (ref 13.0–17.0)
MCH: 32 pg (ref 26.0–34.0)
MCHC: 33.5 g/dL (ref 30.0–36.0)
MCV: 95.5 fL (ref 80.0–100.0)
Platelets: 201 K/uL (ref 150–400)
RBC: 3.56 MIL/uL — ABNORMAL LOW (ref 4.22–5.81)
RDW: 17.6 % — ABNORMAL HIGH (ref 11.5–15.5)
WBC: 7.2 K/uL (ref 4.0–10.5)
nRBC: 0 % (ref 0.0–0.2)

## 2024-09-05 LAB — PARATHYROID HORMONE, INTACT (NO CA): PTH: 308 pg/mL — ABNORMAL HIGH (ref 15–65)

## 2024-09-05 MED ORDER — HEPARIN SODIUM (PORCINE) 5000 UNIT/ML IJ SOLN: 5000.0000 [IU] | Freq: Three times a day (TID) | INTRAMUSCULAR | Status: DC

## 2024-09-05 MED ORDER — POTASSIUM CHLORIDE CRYS ER 20 MEQ PO TBCR
40.0000 meq | EXTENDED_RELEASE_TABLET | Freq: Two times a day (BID) | ORAL | Status: AC
  Administered 2024-09-05 (×2): 40 meq via ORAL
  Filled 2024-09-05 (×2): qty 2

## 2024-09-05 MED ORDER — FUROSEMIDE 10 MG/ML IJ SOLN
40.0000 mg | Freq: Every day | INTRAMUSCULAR | Status: DC
  Administered 2024-09-06: 40 mg via INTRAVENOUS
  Filled 2024-09-05: qty 4

## 2024-09-05 MED ORDER — HEPARIN SODIUM (PORCINE) 5000 UNIT/ML IJ SOLN
5000.0000 [IU] | Freq: Three times a day (TID) | INTRAMUSCULAR | Status: DC
  Administered 2024-09-05 – 2024-09-06 (×3): 5000 [IU] via SUBCUTANEOUS
  Filled 2024-09-05 (×3): qty 1

## 2024-09-05 NOTE — TOC Progression Note (Addendum)
 Transition of Care [name]) - Progression Note    Patient Details  Name: Albert Eaton MRN: [id number] Date of Birth: [birth year]  Transition of Care [facility]) CM/SW Contact  Waddell Barnie Rama, RN Phone Number: 09/05/2024, 3:54 PM  Clinical Narrative:    NPO after MN for fistula ligation tomorrow. ICM will follow for dc needs.  Per PT /OT eval no f/u needed.                     Expected Discharge Plan and Services                                               Social Drivers of Health (SDOH) Interventions SDOH Screenings   Food Insecurity: No Food Insecurity (09/01/2024)  Housing: Low Risk  (09/01/2024)  Transportation Needs: No Transportation Needs (09/01/2024)  Utilities: At Risk (09/01/2024)  Alcohol Screen: Low Risk  (07/13/2023)  Depression (PHQ2-9): Medium Risk (06/01/2024)  Financial Resource Strain: High Risk (07/13/2023)  Physical Activity: Inactive (07/13/2023)  Social Connections: Socially Isolated (07/13/2023)  Stress: No Stress Concern Present (07/13/2023)  Tobacco Use: Medium Risk (08/31/2024)  Health Literacy: Adequate Health Literacy (07/13/2023)    Readmission Risk Interventions    03/03/2024    3:46 PM 09/10/2022    2:51 PM 08/22/2022   11:54 AM  Readmission Risk Prevention Plan  Transportation Screening Complete Complete Complete  Medication Review (RN Care Manager) Referral to Pharmacy Complete Complete  PCP or Specialist appointment within 3-5 days of discharge Complete    HRI or Home Care Consult Complete Complete Complete  SW Recovery Care/Counseling Consult Complete Complete Complete  Palliative Care Screening Not Applicable Not Applicable Not Applicable  Skilled Nursing Facility Not Applicable Complete Not Applicable

## 2024-09-05 NOTE — Progress Notes (Addendum)
 Albert Eaton  Assessment/ Plan: Pt is a 58 y.o. yo male with past medical history significant for asthma, CHF, ESRD status post kidney transplant in 06/2024 at [facility], HIV, hypertension, CAD was admitted with shortness of breath. The kidney transplant was complicated by delayed graft function as well as rejection.  # Post transplant CKD IV CKD stage IV of his allograft with recent treated rejection as below  Most recently appears to be Cr 2.5 - 2.8. Cr up trended today, lower lasix  to once a day.  # S/p renal transplant 07/07/2024 at [facility] complicated by delayed graft function and Acute and Chronic/Active TCMR treated with pulse steroids at the end of October.  #Progressive orthopnea and PND; LVEF ok: ? High output from AVF S/p right heart cath with severely elevated filling pressures and recommendation to continue aggressive diuresis  Continue lasix  40 mg IV BID Plan for AVF ligation tomorrow for high OP AVF.  # Immunosuppression  on Envarsus/mmf/pred at home doses.  # Chronic HIV - on home med.   #HTN - improved on current regimen, monitor BP.   #Secondary hyperparathyroidism On vitamin D  1000 units daily and if PTH is elevated would transition to calcitriol   Noted patient on cinacalcet , as well  pending PTH level.   #Normocytic Anemia; hemoglobin is stable, continue to monitor.  #Hypophosphatemia: Required phosphorus repletion, encourage diet and monitor lab.  # Hypokalemia: Replete potassium chloride .  Addendum 4:00 PM: Discussed with the patient's transplant nephrologist Dr.Elizabeth Kotzen at [facility].  She is suggesting to do banding rather than ligation of AVF because of patient's low GFR.  I have communicated this with the vascular surgeon Dr. Lanis and Dr. Magda today.  Since the patient had Eliquis  today the procedure will not be done tomorrow.  The plan is to do AV fistula banding in the next few days by a vascular surgeon.  I have discussed  and updated this to the patient.  Subjective: Seen and examined at bedside.  Patient reports feeling better today.  Denies nausea, vomiting, chest pain or shortness of breath.  Plan for AV fistula ligation tomorrow.  No new event overnight. Objective Vital signs in last 24 hours: Vitals:   09/05/24 0200 09/05/24 0456 09/05/24 0746 09/05/24 0759  BP: (!) 143/97 (!) 144/100 (!) 148/101   Pulse: 72 75 81 79  Resp: 20 18 20 20   Temp: 98.5 F (36.9 C) 98.5 F (36.9 C) 97.9 F (36.6 C)   TempSrc: Oral Oral Oral   SpO2: 97% 98% 98%   Weight:  89 kg    Height:       Weight change: -1.27 kg  Intake/Output Summary (Last 24 hours) at 09/05/2024 1121 Last data filed at 09/05/2024 1026 Gross per 24 hour  Intake 480 ml  Output 2601 ml  Net -2121 ml       Labs: RENAL PANEL Recent Labs  Lab 08/31/24 1612 09/01/24 1433 09/02/24 0257 09/02/24 1755 09/02/24 1756 09/03/24 0252 09/04/24 0221 09/05/24 0229  NA 137  --  134* 137 137 135 136 135  K 4.3  --  3.8 4.0 3.9 3.5 3.4* 3.4*  CL 109  --  101  --   --  102 100 101  CO2 19*  --  20*  --   --  22 22 24   GLUCOSE 122*  --  119*  --   --  99 116* 104*  BUN 30*  --  36*  --   --  34* 30* 31*  CREATININE 2.44*  --  2.77*  --   --  2.68* 2.61* 2.89*  CALCIUM  8.9  --  8.6*  --   --  8.3* 8.7* 8.2*  PHOS  --  1.8* 1.9*  --   --   --  2.1*  --   ALBUMIN   --   --  3.6  --   --   --   --   --     Liver Function Tests: Recent Labs  Lab 09/02/24 0257  AST 33  ALT 59*  ALKPHOS 93  BILITOT 0.8  PROT 5.9*  ALBUMIN  3.6   No results for input(s): LIPASE, AMYLASE in the last 168 hours. No results for input(s): AMMONIA in the last 168 hours. CBC: Recent Labs    09/02/24 1755 09/02/24 1756 09/03/24 0252 09/04/24 0221 09/05/24 0229  HGB 10.5* 10.5* 10.8* 10.8* 11.4*  MCV  --   --  94.9 95.8 95.5    Cardiac Enzymes: No results for input(s): CKTOTAL, CKMB, CKMBINDEX, TROPONINI in the last 168 hours. CBG: No  results for input(s): GLUCAP in the last 168 hours.  Iron  Studies: No results for input(s): IRON , TIBC, TRANSFERRIN, FERRITIN in the last 72 hours. Studies/Results: VAS US  DUPLEX DIALYSIS ACCESS (AVF, AVG) Result Date: 09/04/2024 DIALYSIS ACCESS Patient Name:  Albert Eaton [name]  Date of Exam:   09/04/2024 Medical Rec #: [id number]        Accession #:    [id number] Date of Birth: 1966-03-02         Patient Gender: M Patient Age:   66 years Exam Location:  [facility] Procedure:      VAS US  DUPLEX DIALYSIS ACCESS (AVF, AVG) Referring Phys: DEBBY ROBERTSON --------------------------------------------------------------------------------  Access Site: Left Upper Extremity. Access Type: Brachiobasilic AVF. History: Large left Brachiobasilic AVF with high flow rate (>2.3L/m in 2019 by          duplex at [facility]). High output heart failure by right heart cath 09/02/24. Limitations: Tortuous and aneurysmal fistula Performing Technologist: [name]  Examination Guidelines: A complete evaluation includes B-mode imaging, spectral Doppler, color Doppler, and power Doppler as needed of all accessible portions of each vessel. Unilateral testing is considered an integral part of a complete examination. Limited examinations for reoccurring indications may be performed as noted.  Findings: +--------------------+----------+-----------------+--------+ AVF                 PSV (cm/s)Flow Vol (mL/min)Comments +--------------------+----------+-----------------+--------+ Native artery inflow   209          3443                +--------------------+----------+-----------------+--------+    Summary: Arteriovenous fistula-Aneurysmal dilatation noted. *See table(s) above for measurements and observations.  Diagnosing physician: Debby Robertson Electronically signed by Debby Robertson on 09/04/2024 at 8:42:18 PM.    --------------------------------------------------------------------------------   Final      Medications: Infusions:   Scheduled Medications:  allopurinol   100 mg Oral Daily   amLODipine   5 mg Oral Daily   apixaban   5 mg Oral BID   atorvastatin   40 mg Oral Daily   bictegravir-emtricitabine -tenofovir  AF  1 tablet Oral Daily   cholecalciferol  1,000 Units Oral Daily   cinacalcet   30 mg Oral Q supper   fluticasone  furoate-vilanterol  1 puff Inhalation Daily   furosemide   40 mg Intravenous BID   isosorbide  mononitrate  15 mg Oral Daily   levothyroxine   25 mcg Oral Q0600   metoprolol  tartrate  12.5 mg Oral BID   mycophenolate  360 mg Oral TID   pantoprazole   40 mg Oral Daily   predniSONE   5 mg Oral Q breakfast   rOPINIRole   1 mg Oral QHS   sodium chloride  flush  3 mL Intravenous Q12H   sodium chloride  flush  3 mL Intravenous Q12H   sodium chloride  flush  3 mL Intravenous Q12H   sulfamethoxazole-trimethoprim  1 tablet Oral Q M,W,F   tacrolimus ER  8 mg Oral Daily   And   tacrolimus ER  1 mg Oral QAC breakfast   valGANciclovir  450 mg Oral Q48H    have reviewed scheduled and prn medications.  Physical Exam: General:NAD, comfortable Heart:RRR, s1s2 nl Lungs:clear b/l, no crackle Abdomen:soft, Non-tender, non-distended Extremities:No edema Dialysis Access: Aneurysmal AV fistula with good thrill.  Cameryn Chrisley Prasad Taheem Fricke 09/05/2024,11:21 AM  LOS: 3 days

## 2024-09-05 NOTE — Progress Notes (Addendum)
 Occupational Therapy Treatment/Discharge Patient Details Name: Albert Eaton Chronis MRN: [id number] DOB: 1966/03/02 Today's Date: 09/05/2024   History of present illness 58 yo M adm 08/31/24 with CHF exacerbation. Right heart catheterization 11/7. Planned fistula litagation on 11/11. PMhx: renal transplant Sept 2025, HfrEF, MVR, ESRD, pulmonary HTN, HIV, RCC s/p lt nephrectomy, CVA, neuropathy, PAF, HTN, OSA, gout, hypothyroidism, HLD, IDA   OT comments  Pt reports gradual improvements with SOB since cardiac cath and hopes for further improvements after planned procedure tomorrow. Pt able to mobilize around unit Independently with no DOE noted. Pt also reports able to manage ADLs, making bed and cleaning up room over the weekend without issues. Discussed shower chair recommendation w/ pt planning to monitor breathing upon DC post procedure and determine if still needed. Pt reports his insurance has allotted money that can be used for medical supplies. No OT needed upon DC.  SpO2 100% on RA HR 86bpm at rest, 99bpm with activity       If plan is discharge home, recommend the following:  Other (comment) (PRN)   Equipment Recommendations  Tub/shower seat (pt to look into shower chair after DC)    Recommendations for Other Services      Precautions / Restrictions Precautions Precautions: None Restrictions Weight Bearing Restrictions Per Provider Order: No       Mobility Bed Mobility Overal bed mobility: Modified Independent                  Transfers Overall transfer level: Independent Equipment used: None                     Balance Overall balance assessment: No apparent balance deficits (not formally assessed)                                         ADL either performed or assessed with clinical judgement   ADL Overall ADL's : Modified independent                                       General ADL Comments: has been going to  bathroom independently. reports able to straighten up room and make up bed over the weekend. Today, able to mobilize on unit Independently with gradual improvements in DOE s/p heart cath. Pt hopes for further improvements after fistula litagation tomorrow. Discussed with pt plan to hold off on shower chair until home after procedure and determines if SOB still a limiting factor    Extremity/Trunk Assessment Upper Extremity Assessment Upper Extremity Assessment: Overall WFL for tasks assessed   Lower Extremity Assessment Lower Extremity Assessment: Defer to PT evaluation        Vision   Vision Assessment?: No apparent visual deficits;Wears glasses for reading   Perception     Praxis     Communication Communication Communication: No apparent difficulties   Cognition Arousal: Alert Behavior During Therapy: WFL for tasks assessed/performed Cognition: No apparent impairments                               Following commands: Intact        Cueing   Cueing Techniques: Verbal cues  Exercises      Shoulder Instructions  General Comments      Pertinent Vitals/ Pain       Pain Assessment Pain Assessment: No/denies pain  Home Living                                          Prior Functioning/Environment              Frequency  Min 2X/week        Progress Toward Goals  OT Goals(current goals can now be found in the care plan section)  Progress towards OT goals: Goals met and updated - see care plan  Acute Rehab OT Goals Patient Stated Goal: continued SOB improvements OT Goal Formulation: With patient Time For Goal Achievement: 09/16/24 Potential to Achieve Goals: Good ADL Goals Additional ADL Goal #1: Pt to verbalize at least 3 energy conservation strategies to implement during ADLs, IADLs and mobility at home Additional ADL Goal #2: Pt to increase standing activity tolerance > 6 min during ADLs/mobility without need for  seated rest break  Plan      Co-evaluation                 AM-PAC OT 6 Clicks Daily Activity     Outcome Measure   Help from another person eating meals?: None Help from another person taking care of personal grooming?: None Help from another person toileting, which includes using toliet, bedpan, or urinal?: None Help from another person bathing (including washing, rinsing, drying)?: None Help from another person to put on and taking off regular upper body clothing?: None Help from another person to put on and taking off regular lower body clothing?: None 6 Click Score: 24    End of Session    OT Visit Diagnosis: Other (comment) (decreased cardiopulmonary endurance)   Activity Tolerance Patient tolerated treatment well   Patient Left in bed;with call bell/phone within reach   Nurse Communication          Time: 9140-9088 OT Time Calculation (min): 12 min  Charges: OT General Charges $OT Visit: 1 Visit OT Treatments $Therapeutic Activity: 8-22 mins  Mliss NOVAK, OTR/L Acute Rehab Services Office: [phone]   Mliss Fish 09/05/2024, 9:20 AM

## 2024-09-05 NOTE — Progress Notes (Addendum)
  Progress Note    09/05/2024 8:19 AM 3 Days Post-Op  Subjective:  no complaints   Vitals:   09/05/24 0746 09/05/24 0759  BP: (!) 148/101   Pulse: 81 79  Resp: 20 20  Temp: 97.9 F (36.6 C)   SpO2: 98%    Physical Exam: Lungs:  non labored Extremities:  palpable thrill L arm AVF; palpable L radial pulse Neurologic: A&O  CBC    Component Value Date/Time   WBC 7.2 09/05/2024 0229   RBC 3.56 (L) 09/05/2024 0229   HGB 11.4 (L) 09/05/2024 0229   HGB 11.9 (L) 05/05/2023 1012   HCT 34.0 (L) 09/05/2024 0229   HCT 34.8 (L) 05/05/2023 1012   PLT 201 09/05/2024 0229   PLT 285 05/05/2023 1012   MCV 95.5 09/05/2024 0229   MCV 96 05/05/2023 1012   MCH 32.0 09/05/2024 0229   MCHC 33.5 09/05/2024 0229   RDW 17.6 (H) 09/05/2024 0229   RDW 16.2 (H) 05/05/2023 1012   LYMPHSABS 0.6 (L) 08/31/2024 1612   LYMPHSABS 2.2 09/20/2018 0936   MONOABS 0.6 08/31/2024 1612   EOSABS 0.0 08/31/2024 1612   EOSABS 0.1 09/20/2018 0936   BASOSABS 0.0 08/31/2024 1612   BASOSABS 0.0 09/20/2018 0936    BMET    Component Value Date/Time   NA 135 09/05/2024 0229   NA 141 07/25/2022 1244   K 3.4 (L) 09/05/2024 0229   CL 101 09/05/2024 0229   CO2 24 09/05/2024 0229   GLUCOSE 104 (H) 09/05/2024 0229   BUN 31 (H) 09/05/2024 0229   BUN 18 07/25/2022 1244   CREATININE 2.89 (H) 09/05/2024 0229   CREATININE 3.23 (H) 08/22/2024 1114   CALCIUM  8.2 (L) 09/05/2024 0229   GFRNONAA 24 (L) 09/05/2024 0229   GFRNONAA 17 (L) 08/31/2017 0908   GFRAA 8 (L) 09/28/2020 1012   GFRAA 20 (L) 08/31/2017 0908    INR    Component Value Date/Time   INR 1.3 (H) 08/03/2024 0500   INR 1.3 04/21/2024 1402     Intake/Output Summary (Last 24 hours) at 09/05/2024 0819 Last data filed at 09/05/2024 0239 Gross per 24 hour  Intake 720 ml  Output 2101 ml  Net -1381 ml     Assessment/Plan:  58 y.o. male with  1) large left brachiobasilic AVF with high flow rate (>2.3L/m in 2019 by duplex) 2) CKD IV after  kidney transplantation 07/23/24 at [facility] 2/2 rejection and delayed graft function 3) High output heart failure by right heart cath 09/02/24  Plan is for L arm AVF ligation in the OR tomorrow 11/11.  Case was discussed in detail with the patient and he is agreeable to proceed.  NPO past midnight; consent ordered   Donnice Sender, PA-C [facility] [phone] 09/05/2024 8:19 AM  VASCULAR STAFF ADDENDUM: I agree with the above.  Eliquis  given and so surgery will be postponed.  Discussed with Dr. Dolan who has communicated with the patient's [facility] transplant nephrologist.  He asked that we perform banding instead of ligation.  Again, I feel this will not adequately heart failure, but will do what the transplant nephrologist feels is best.   Debby SAILOR. Magda, MD [facility] Phone Number: [phone] 09/05/2024 4:11 PM

## 2024-09-05 NOTE — Progress Notes (Signed)
 Progress Note  Patient Name: Albert Eaton Will Date of Encounter: 09/05/2024 Primary Cardiologist: [name], MD   Subjective   Overnight seen by vascular surgery with plan for L arm AVF Ligation 11/11 Patient notes that he feels back to baseline.  Vital Signs    Vitals:   09/05/24 0200 09/05/24 0456 09/05/24 0746 09/05/24 0759  BP: (!) 143/97 (!) 144/100 (!) 148/101   Pulse: 72 75 81 79  Resp: 20 18 20 20   Temp: 98.5 F (36.9 C) 98.5 F (36.9 C) 97.9 F (36.6 C)   TempSrc: Oral Oral Oral   SpO2: 97% 98% 98%   Weight:  89 kg    Height:        Intake/Output Summary (Last 24 hours) at 09/05/2024 0840 Last data filed at 09/05/2024 0239 Gross per 24 hour  Intake 720 ml  Output 2101 ml  Net -1381 ml   Filed Weights   09/03/24 0522 09/04/24 0446 09/05/24 0456  Weight: 91.7 kg 90.3 kg 89 kg    Physical Exam   GEN: No acute distress.   Neck: No JVD Cardiac: RRR, holosystolic murmur with AV bruit; this may be a radiation, no rubs, or gallops.  Respiratory: Clear to auscultation bilaterally. GI: Soft, nontender, non-distended  MS: No edema  Labs  EKG: SR with BAE and PACs 09/01/24 personally reveiwed Telemetry: SR with PACs   Chemistry Recent Labs  Lab 09/02/24 0257 09/02/24 1755 09/03/24 0252 09/04/24 0221 09/05/24 0229  NA 134*   < > 135 136 135  K 3.8   < > 3.5 3.4* 3.4*  CL 101  --  102 100 101  CO2 20*  --  22 22 24   GLUCOSE 119*  --  99 116* 104*  BUN 36*  --  34* 30* 31*  CREATININE 2.77*  --  2.68* 2.61* 2.89*  CALCIUM  8.6*  --  8.3* 8.7* 8.2*  PROT 5.9*  --   --   --   --   ALBUMIN  3.6  --   --   --   --   AST 33  --   --   --   --   ALT 59*  --   --   --   --   ALKPHOS 93  --   --   --   --   BILITOT 0.8  --   --   --   --   GFRNONAA 26*  --  27* 28* 24*  ANIONGAP 13  --  11 14 10    < > = values in this interval not displayed.     Hematology Recent Labs  Lab 09/03/24 0252 09/04/24 0221 09/05/24 0229  WBC 5.2 6.1 7.2  RBC  3.36* 3.35* 3.56*  HGB 10.8* 10.8* 11.4*  HCT 31.9* 32.1* 34.0*  MCV 94.9 95.8 95.5  MCH 32.1 32.2 32.0  MCHC 33.9 33.6 33.5  RDW 18.0* 17.5* 17.6*  PLT 191 192 201     BNP Recent Labs  Lab 08/31/24 1612  BNP 1,578.9*      Cardiac Studies   Cardiac Studies & Procedures   ______________________________________________________________________________________________ CARDIAC CATHETERIZATION  CARDIAC CATHETERIZATION 09/02/2024  Conclusion 1.  Fick cardiac output of 13.2 L/min and Fick cardiac index of 6.2 L/min/m with the following hemodynamics: Right atrial pressure mean of 21 mmHg Right ventricular pressure 65/19 with an end-diastolic pressure of 31 mmHg Wedge pressure mean of 29 mmHg with V waves to 48 mmHg PA pressure 62/31 with a  mean of 44 mmHg PVR 1.1 Woods units PA pulsatility index of 1.5  Summary: Severely elevated filling pressures; continue aggressive diuresis.   CARDIAC CATHETERIZATION  CARDIAC CATHETERIZATION 09/11/2022  Conclusion   Mid LAD lesion is 50% stenosed.   Prox LAD lesion is 50% stenosed.   Prox RCA lesion is 40% stenosed.   2nd Mrg lesion is 30% stenosed.   LV end diastolic pressure is mildly elevated.   There is no aortic valve stenosis.  No change in coronary anatomy since October 2023. Consider EP referral for consideration of AICD.  Findings Coronary Findings Diagnostic  Dominance: Left  Left Anterior Descending Vessel is large. The LAD wraps around the LV apex.  There is moderate diffuse 40 to 50% proximal LAD stenosis.  The mid and distal LAD have no obstructive disease. Prox LAD lesion is 50% stenosed. Mid LAD lesion is 50% stenosed.  Left Circumflex Vessel is large. The vessel exhibits minimal luminal irregularities. The circumflex is dominant.  There is mild nonobstructive plaquing throughout.  There are no high-grade stenoses throughout the circumflex, obtuse marginal, or posterolateral branches.  Second Obtuse Marginal  Branch 2nd Mrg lesion is 30% stenosed.  Right Coronary Artery Vessel is moderate in size. Nondominant vessel.  Mild nonobstructive proximal vessel disease of about 40%. Prox RCA lesion is 40% stenosed.  Intervention  No interventions have been documented.   STRESS TESTS  MYOCARDIAL PERFUSION IMAGING 08/05/2024  Interpretation Summary   Findings are consistent with no ischemia and no infarction. The study is low risk.   No ST deviation was noted. Arrhythmias during stress: occasional PVCs. Arrhythmias during recovery: occasional PVCs.   LV perfusion is normal. There is no evidence of ischemia. There is no evidence of infarction.   Left ventricular function is abnormal. Global function is mildly reduced. Nuclear stress EF: 49%. The left ventricular ejection fraction is mildly decreased (45-54%). End diastolic cavity size is severely enlarged. End systolic cavity size is moderately enlarged. No evidence of transient ischemic dilation (TID) noted.   Prior study available for comparison. No changes compared to prior study.   ECHOCARDIOGRAM  ECHOCARDIOGRAM COMPLETE 09/01/2024  Narrative ECHOCARDIOGRAM REPORT    Patient Name:   Albert Eaton Date of Exam: 09/01/2024 Medical Rec #:  [id number]       Height:       70.0 in Accession #:    [id number]      Weight:       205.0 lb Date of Birth:  [birth year]        BSA:          2.109 m Patient Age:    58 years        BP:           166/89 mmHg Patient Gender: M               HR:           88 bpm. Exam Location:  Inpatient  Procedure: 2D Echo, Cardiac Doppler and Color Doppler (Both Spectral and Color Flow Doppler were utilized during procedure).  Indications:    Congestive Heart Failure I50.9  History:        Patient has no prior history of Echocardiogram examinations, most recent 03/03/2024.  Mitral Valve: bioprosthetic valve valve is present in the mitral position.  Sonographer:    Jayson Gaskins Referring Phys: STEVEN J  NEWTON  IMPRESSIONS   1. Left ventricular ejection fraction, by estimation, is 50 to 55%. The left ventricle has  low normal function. The left ventricle has no regional wall motion abnormalities. The left ventricular internal cavity size was mildly dilated. There is moderate concentric left ventricular hypertrophy. Left ventricular diastolic parameters are indeterminate. 2. Right ventricular systolic function is normal. The right ventricular size is moderately enlarged. There is moderately elevated pulmonary artery systolic pressure. The estimated right ventricular systolic pressure is 47.3 mmHg. 3. Left atrial size was severely dilated. 4. Right atrial size was severely dilated. 5. The mitral valve has been repaired/replaced. Mild mitral valve regurgitation. No evidence of mitral stenosis. There is a bioprosthetic valve present in the mitral position. 6. The tricuspid valve is degenerative. Tricuspid valve regurgitation is severe. 7. The aortic valve has an indeterminant number of cusps. There is mild calcification of the aortic valve. Aortic valve regurgitation is trivial. Aortic valve sclerosis/calcification is present, without any evidence of aortic stenosis. 8. There is mild dilatation of the ascending aorta, measuring 38 mm. 9. The inferior vena cava is dilated in size with <50% respiratory variability, suggesting right atrial pressure of 15 mmHg.  FINDINGS Left Ventricle: Left ventricular ejection fraction, by estimation, is 50 to 55%. The left ventricle has low normal function. The left ventricle has no regional wall motion abnormalities. The left ventricular internal cavity size was mildly dilated. There is moderate concentric left ventricular hypertrophy. Left ventricular diastolic parameters are indeterminate.  Right Ventricle: The right ventricular size is moderately enlarged. No increase in right ventricular wall thickness. Right ventricular systolic function is normal. There is  moderately elevated pulmonary artery systolic pressure. The tricuspid regurgitant velocity is 2.84 m/s, and with an assumed right atrial pressure of 15 mmHg, the estimated right ventricular systolic pressure is 47.3 mmHg.  Left Atrium: Left atrial size was severely dilated.  Right Atrium: Right atrial size was severely dilated.  Pericardium: There is no evidence of pericardial effusion. Presence of epicardial fat layer.  Mitral Valve: The mitral valve has been repaired/replaced. Mild mitral valve regurgitation. There is a bioprosthetic valve present in the mitral position. No evidence of mitral valve stenosis. MV peak gradient, 10.4 mmHg. The mean mitral valve gradient is 3.0 mmHg.  Tricuspid Valve: The tricuspid valve is degenerative in appearance. Tricuspid valve regurgitation is severe.  Aortic Valve: The aortic valve has an indeterminant number of cusps. There is mild calcification of the aortic valve. Aortic valve regurgitation is trivial. Aortic valve sclerosis/calcification is present, without any evidence of aortic stenosis. Aortic valve mean gradient measures 4.0 mmHg. Aortic valve peak gradient measures 4.8 mmHg. Aortic valve area, by VTI measures 4.02 cm.  Pulmonic Valve: The pulmonic valve was grossly normal. Pulmonic valve regurgitation is mild.  Aorta: The aortic root is normal in size and structure. There is mild dilatation of the ascending aorta, measuring 38 mm.  Venous: The inferior vena cava is dilated in size with less than 50% respiratory variability, suggesting right atrial pressure of 15 mmHg.  IAS/Shunts: No atrial level shunt detected by color flow Doppler.   LEFT VENTRICLE PLAX 2D LVIDd:         5.70 cm   Diastology LVIDs:         4.25 cm   LV e' medial:    9.03 cm/s LV PW:         1.40 cm   LV E/e' medial:  16.4 LV IVS:        1.60 cm   LV e' lateral:   10.60 cm/s LVOT diam:     2.00 cm   LV  E/e' lateral: 14.0 LV SV:         74 LV SV Index:   35 LVOT  Area:     3.14 cm   RIGHT VENTRICLE RV S prime:     9.68 cm/s  LEFT ATRIUM              Index        RIGHT ATRIUM           Index LA Vol (A2C):   94.4 ml  44.75 ml/m  RA Area:     34.20 cm LA Vol (A4C):   111.0 ml 52.62 ml/m  RA Volume:   130.00 ml 61.63 ml/m LA Biplane Vol: 105.0 ml 49.78 ml/m AORTIC VALVE AV Area (Vmax):    3.37 cm AV Area (Vmean):   3.19 cm AV Area (VTI):     4.02 cm AV Vmax:           110.00 cm/s AV Vmean:          92.400 cm/s AV VTI:            0.185 m AV Peak Grad:      4.8 mmHg AV Mean Grad:      4.0 mmHg LVOT Vmax:         118.00 cm/s LVOT Vmean:        93.900 cm/s LVOT VTI:          0.237 m LVOT/AV VTI ratio: 1.28  AORTA Ao Root diam: 3.50 cm  MITRAL VALVE                TRICUSPID VALVE MV Area (PHT): 3.37 cm     TR Peak grad:   32.3 mmHg MV Area VTI:   2.85 cm     TR Vmax:        284.00 cm/s MV Peak grad:  10.4 mmHg MV Mean grad:  3.0 mmHg     SHUNTS MV Vmax:       1.61 m/s     Systemic VTI:  0.24 m MV Vmean:      81.1 cm/s    Systemic Diam: 2.00 cm MV Decel Time: 225 msec MV E velocity: 148.00 cm/s MV A velocity: 85.90 cm/s MV E/A ratio:  1.72  Toribio Fuel MD Electronically signed by Toribio Fuel MD Signature Date/Time: 09/01/2024/11:45:33 PM    Final   TEE  ECHO INTRAOPERATIVE TEE 08/21/2022  Narrative *INTRAOPERATIVE TRANSESOPHAGEAL REPORT *    Patient Name:   Albert Eaton Date of Exam: 08/21/2022 Medical Rec #:  [id number]       Height:       70.0 in Accession #:    [id number]      Weight:       206.0 lb Date of Birth:  October 06, 1966        BSA:          2.11 m Patient Age:    56 years        BP:           122/81 mmHg Patient Gender: M               HR:           77 bpm. Exam Location:  Inpatient  Transesophogeal exam was perform intraoperatively during surgical procedure. Patient was closely monitored under general anesthesia during the entirety of examination.  Indications:     mitral  regurgitation Sonographer:     Tinnie Barefoot RDCS Performing Phys: [phone]  DEWARD KALLMAN Diagnosing Phys: Juliene Clinton MD  Complications: No known complications during this procedure. POST-OP IMPRESSIONS _ Mitral Valve: No stenosis present. There is trace regurgitation. The gradient recorded across the prosthetic valve is within the expected range. The mean PG across the repaired mitral valve is 3 mmHg.The mitral valve is status post repair with an annuloplasty ring.  PRE-OP  FINDINGS Left Ventricle: The left ventricle has low normal systolic function, with an ejection fraction of 50-55%. The cavity size was normal. There is mildly increased left ventricular wall thickness. There is mild concentric left ventricular hypertrophy.   Right Ventricle: The right ventricle has normal systolic function. The cavity was normal. There is no increase in right ventricular wall thickness.  Left Atrium: Left atrial size was dilated. No left atrial/left atrial appendage thrombus was detected.  Right Atrium: Right atrial size was dilated.  Interatrial Septum: No atrial level shunt detected by color flow Doppler.  Pericardium: There is no evidence of pericardial effusion.  Mitral Valve: The mitral valve is dilated. Mitral valve regurgitation is severe by color flow Doppler. The MR jet is centrally-directed. There is No evidence of mitral stenosis. Mitral valve annulus is dilated with an anterior-posterior dimension of 4.12cm. Diameter in the commissural view is 3.76cm. Regurgitant jet is severe with a dense signal on CWD. No flow reversal is noted in pulmonary veins.  Tricuspid Valve: The tricuspid valve was normal in structure. Tricuspid valve regurgitation is moderate by color flow Doppler. The jet is directed centrally. No evidence of tricuspid stenosis is present.  Aortic Valve: The aortic valve is tricuspid Aortic valve regurgitation was not visualized by color flow Doppler. There is no stenosis  of the aortic valve.   Pulmonic Valve: The pulmonic valve was normal in structure. Pulmonic valve regurgitation is not visualized by color flow Doppler.   Aorta: The aortic root, ascending aorta and aortic arch are normal in size and structure.  +-------------+-----------++ +---------------+-----------++ MR Peak grad:79.2 mmHg   TRICUSPID VALVE            +-------------+-----------++ +---------------+-----------++ MR Mean grad:49.0 mmHg   TR Peak grad:  32.5 mmHg   +-------------+-----------++ +---------------+-----------++ MR Vmax:     445.00 cm/s TR Vmax:       285.00 cm/s +-------------+-----------++ +---------------+-----------++ MR Vmean:    324.0 cm/s  +-------------+-----------++   Juliene Clinton MD Electronically signed by Juliene Clinton MD Signature Date/Time: 08/22/2022/11:46:22 AM    Final  MONITORS  LONG TERM MONITOR (3-14 DAYS) 06/05/2023  Narrative Patch Wear Time:  13 days and 23 hours (2024-07-14T22:15:08-399 to 2024-07-28T22:15:04-399)  Patient had a min HR of 53 bpm, max HR of 193 bpm, and avg HR of 81 bpm. Predominant underlying rhythm was Sinus Rhythm. 1 run of Ventricular Tachycardia occurred lasting 4 beats with a max rate of 193 bpm (avg 182 bpm). Atrial Flutter occurred (2% burden), ranging from 96-177 bpm (avg of 132 bpm), the longest lasting 33 mins 59 secs with an avg rate of 137 bpm. Atrial Flutter may be possible Atrial Tachycardia with variable block. Isolated SVEs were occasional (3.5%, [id number]), SVE Couplets were occasional (1.0%, 8279), and SVE Triplets were rare (<1.0%, 1481). Isolated VEs were occasional (2.4%, 38757), VE Couplets were rare (<1.0%, 2424), and VE Triplets were rare (<1.0%, 27). Ventricular Bigeminy and Trigeminy were present.  SUMMARY: Normal sinus with average HR of 81 bpm. Atrial flutter occurs with a 2% burden, average heart rate during atrial flutter is 137 bpm. Occasional PVC's with a burden of  2.4%.  CT SCANS  CT CORONARY FRACTIONAL FLOW RESERVE DATA PREP 10/11/2020  Narrative EXAM: FFRCT ANALYSIS  FINDINGS: FFRct analysis was performed on the original cardiac CT angiogram dataset. Diagrammatic representation of the FFRct analysis is provided in a separate PDF document in PACS. This dictation was created using the PDF document and an interactive 3D model of the results. 3D model is not available in the EMR/PACS. Normal FFR range is >0.80.  1. Left Main:No significant stenosis. LM FFR = 0.99.  2. LAD: No significant stenosis. Proximal FFR = 0.99, Mid FFR = 0.94, Distal FFR = 0.86.  3. LCX: No significant stenosis. Proximal FFR = 1.00, Mid FFR = 1.00, Distal FFR = 1.00. PDA FFR = 0.84, LPLA FFR = 0.88.  4. OM: No significant stenosis. Proximal FFR = 1.00 and mid FFR = 0.98.  5. RCA: No significant stenosis. Proximal FFR = 0.99 and mid FFR = 0.87.  IMPRESSION: 1. Coronary CTA FFR flow analysis demonstrates no hemodynamically flow limiting lesion.  Wilbert Turner   Electronically Signed By: Wilbert Bihari On: 10/17/2020 12:15   CT SCANS  CT CORONARY MORPH W/CTA COR W/SCORE 10/10/2020  Addendum 10/11/2020 11:39 AM ADDENDUM REPORT: 10/11/2020 11:37  EXAM: Cardiac/Coronary  CT  TECHNIQUE: The patient was scanned on a [employer].  FINDINGS: A 120 kV prospective scan was triggered in the descending thoracic aorta at 111 HU's. Axial non-contrast 3 mm slices were carried out through the heart. The data set was analyzed on a dedicated work station and scored using the Agatson method. Gantry rotation speed was 250 msecs and collimation was .6 mm. No beta blockade and 0.8 mg of sl NTG was given. The 3D data set was reconstructed in 5% intervals of the 67-82 % of the R-R cycle. Diastolic phases were analyzed on a dedicated work station using MPR, MIP and VRT modes. The patient received 80 cc of contrast.  Aorta: Mildly dilated ascending aorta  at 38mm. No calcifications. No dissection.  Aortic Valve:  Trileaflet.  No calcifications.  Coronary Arteries:  Normal coronary origin.  Right dominance.  RCA is a small non dominant artery that gives rise to PDA and PLVB. There is mild calcified plaque in the proximal RCA with associated stenosis of 25-49%. There is significant noise artifact prohibiting accurate assessment for non calcified plaque.  Left main is a large artery that gives rise to LAD and LCX arteries. There is minimal calcified plaque in the proximal and distal LM with associated stenosis of 1-24%.  LAD is a large vessel that gives rise to 3 moderate to large sized diagonals. There is mild mixed plaque in the ostial and proximal LAD with associated stenosis of 25-49% and scattered mild calcified plaque in the mid LAD with associated stenosis of 25-49%.  LCX is a dominant artery that gives rise to one large OM1 branch and then a moderate sized LPLA and LPDA. There is mild calcified plaque in the proximal OM1 and proximal PDA with associated stenosis of 25-49%.  Other findings:  Normal pulmonary vein drainage into the left atrium.  Normal let atrial appendage without a thrombus.  Normal size of the pulmonary artery.  IMPRESSION: 1. Coronary calcium  score of 437. This was 98th percentile for age and sex matched control.  2.  Normal coronary origin with left dominance.  3.  Mild atherosclerosis.  CAD RADS 2.  4. Recommend preventive pharmacotherapy and risk factor modification.  5.  This study has been sent for FFR flow analysis.  Wilbert Bihari   Electronically Signed By: Wilbert Bihari On: 10/11/2020 11:37  Narrative EXAM: OVER-READ INTERPRETATION  CT CHEST  The following report is an over-read performed by radiologist Dr. Newell Eke of [facility] on 10/10/2020. This over-read does not include interpretation of cardiac or coronary anatomy or pathology. The coronary calcium   score/coronary CTA interpretation by the cardiologist is attached.  COMPARISON:  08/30/2020.  FINDINGS: Vascular: Pulmonic trunk and heart are enlarged. No pericardial effusion.  Mediastinum/Nodes: No pathologically enlarged lymph nodes. Distal esophagus is unremarkable.  Lungs/Pleura: Minimal dependent atelectasis bilaterally. Lungs are otherwise clear.  Upper Abdomen: Visualized portions of the liver, spleen and stomach are grossly unremarkable.  Musculoskeletal: None.  IMPRESSION: 1. No acute extracardiac findings. 2. Enlarged pulmonic trunk, indicative of pulmonary arterial hypertension.  Electronically Signed: By: Newell Eke M.D. On: 10/10/2020 09:42     ______________________________________________________________________________________________        Assessment & Plan   HFpEF; High output HF - hypervolemic with ESRD s/p prior renal transplant 9/25; Nephrology is managing diuretics agree with lasix  IV 40 mg BID; potential PO transition tomorrow -  with HTN on norvasc  5 mg, on Imdur  15 mg PO daily; may need increase to 10 mg norvasc  at DC  CAD- continue metoprolol  12.5 mg PO BID, LVEF have recovered from prior s/p MVR - MF s/p MVR (minimally invasive 10/23)   PAF - on eliquis  5 mg PO BID Hf of PAF  HLD with Aortic atherosclerosis- continue atorvastatin  40 mg  As per Primary or other consultants: HTN Hypothyroidism HIV; Heb B Renal CA s/p L Nephrectomy OSA  Planned for the OR 09/06/24 and potential discharge soon after; will arrange f/u with Dr. Margurite team.  For questions or updates, please contact [facility] Please consult [email or web address] for contact info under Cardiology/STEMI.      Stanly Leavens, MD FASE [facility] Cardiologist [facility]  [address] [place], [address] [place], [place] [address] [phone]  8:40 AM

## 2024-09-05 NOTE — Plan of Care (Signed)
  Problem: Education: Goal: Knowledge of General Education information will improve Description: Including pain rating scale, medication(s)/side effects and non-pharmacologic comfort measures Outcome: Progressing   Problem: Clinical Measurements: Goal: Will remain free from infection Outcome: Progressing   Problem: Coping: Goal: Level of anxiety will decrease Outcome: Progressing   Problem: Pain Managment: Goal: General experience of comfort will improve and/or be controlled Outcome: Progressing

## 2024-09-05 NOTE — Progress Notes (Signed)
 Heart Failure Navigator Progress Note  Assessed for Heart & Vascular TOC clinic readiness.  Patient does not meet criteria due to EF 50-55%, No HF TOC per Dr. Tobie..   Navigator will sign off at this time.   Stephane Haddock, BSN, Scientist, Clinical (histocompatibility And Immunogenetics) Only

## 2024-09-05 NOTE — Progress Notes (Signed)
 [facility] Progress Note Patient: Albert Eaton FMW:[id number] DOB: Aug 17, 1966  DOA: 08/31/2024 DOS: the patient was seen and examined on 09/05/2024  Brief Hospital Course: 58/M with chronic systolic CHF, ESRD recent renal transplant on 07/07/2024, HIV, hypertension, hypothyroidism, OSA, CAD, atrial flutter presented to the ED with ongoing dyspnea on exertion and severe orthopnea for 3 months.  Symptoms started prior to his renal transplant and have persisted since, only minimal swelling noted compliant with home antirejection medications.  In the ED vital stable, WBC 7, hemoglobin 14, troponin 40, 50 creatinine 2.4, BNP 1579, chest x-ray with mild bibasilar edema. - Admitted, cards and nephrology consulting, started on diuretics,   Assessment and Plan: Acute on chronic diastolic CHF 2D echo May 2025 with EF of 50 to 55%, moderately elevated PASP, normal RV Concern for high-output CHF from large AV fistula,  RHC with significantly elevated filling pressures, CO 13.2, cardiac index 6.2, Diuresing well, Cardiology recommended fistula ligation.  Vascular surgery arranging the procedure on Wednesday for banding.   History of ESRD status post renal transplant Baseline ESRD on hemodialysis status post DDKT July 07, 2024 in the [facility] system On immunosuppressant regimen including Myfortic, tacrolimus, prednisone  CMV ppx: Valcyte x 3 months (EOT 10/06/2024)  PJP ppx:Bactrim x 6 months (EOT 01/12/2025 unless CD4 <200) Appreciate nephrology input   Atrial flutter, unspecified type  Noted prior history of atrial fibrillation and atrial flutter Was previously on Coumadin  Looks to be on hold from recent renal transplant September 2025 On anticoagulation.   CAD (coronary artery disease) Stable, continue aspirin  and Crestor    Obstructive sleep apnea CPAP   Chronic kidney disease, stage IV Creatinine continues to trend upward Baseline appears to be 2.5-2.8. Prior history of renal  transplant.  Most likely will require dialysis in the near future. Has AV fistula.  Which will be banded for high-output failure. Renal transplant September 2025 at [facility] complicated by delayed graft function. On steroids   Gout arthritis Continue allopurinol    HIV disease Biktarvy  continued, follow-up with infectious disease   Subjective: No nausea no vomiting.  No fever no chills.  Having shortness of breath although improving.  Was able to walk in the hallway.  Physical Exam: Denies any acute complaint.  No nausea vomit has ongoing shortness of breath.  Data Reviewed: I have Reviewed nursing notes, Vitals, and Lab results. Since last encounter, pertinent lab results CBC and BMP   . I have ordered test including CBC and BMP  . I have discussed pt's care plan and test results with nephrology  .   Disposition: Status is: Inpatient Remains inpatient appropriate because: Monitor for improvement in respiratory symptoms  heparin  injection 5,000 Units Start: 09/05/24 2200   Family Communication: No one at bedside Level of care: Telemetry   Vitals:   09/05/24 0746 09/05/24 0759 09/05/24 1136 09/05/24 1605  BP: (!) 148/101  (!) 138/92 126/83  Pulse: 81 79 75 72  Resp: 20 20 (!) 24 20  Temp: 97.9 F (36.6 C)  97.8 F (36.6 C) 98.3 F (36.8 C)  TempSrc: Oral  Oral Oral  SpO2: 98%  93% 97%  Weight:      Height:         Author: Yetta Blanch, MD 09/05/2024 5:50 PM  Please look on [email or web address] to find out who is on call.

## 2024-09-05 NOTE — Plan of Care (Signed)

## 2024-09-05 NOTE — Hospital Course (Signed)
 58/M with chronic systolic CHF, ESRD recent renal transplant on 07/07/2024, HIV, hypertension, hypothyroidism, OSA, CAD, atrial flutter presented to the ED with ongoing dyspnea on exertion and severe orthopnea for 3 months.  Symptoms started prior to his renal transplant and have persisted since, only minimal swelling noted compliant with home antirejection medications.  In the ED vital stable, WBC 7, hemoglobin 14, troponin 40, 50 creatinine 2.4, BNP 1579, chest x-ray with mild bibasilar edema. - Admitted, cards and nephrology consulting, started on diuretics,   Assessment and Plan: Acute on chronic diastolic CHF 2D echo May 2025 with EF of 50 to 55%, moderately elevated PASP, normal RV Concern for high-output CHF from large AV fistula,  RHC with significantly elevated filling pressures, CO 13.2, cardiac index 6.2, Diuresing well, Cardiology recommended fistula ligation.  Vascular surgery arranging the procedure on Wednesday for banding.   History of ESRD status post renal transplant Baseline ESRD on hemodialysis status post DDKT July 07, 2024 in the [facility] system On immunosuppressant regimen including Myfortic, tacrolimus, prednisone  CMV ppx: Valcyte x 3 months (EOT 10/06/2024)  PJP ppx:Bactrim x 6 months (EOT 01/12/2025 unless CD4 <200) Appreciate nephrology input   Atrial flutter, unspecified type  Noted prior history of atrial fibrillation and atrial flutter Was previously on Coumadin  Looks to be on hold from recent renal transplant September 2025 On anticoagulation.   CAD (coronary artery disease) Stable, continue aspirin  and Crestor    Obstructive sleep apnea CPAP   Chronic kidney disease, stage IV Creatinine continues to trend upward Baseline appears to be 2.5-2.8. Prior history of renal transplant.  Most likely will require dialysis in the near future. Has AV fistula.  Which will be banded for high-output failure. Renal transplant September 2025 at [facility] complicated by delayed  graft function. On steroids   Gout arthritis Continue allopurinol    HIV disease Biktarvy  continued, follow-up with infectious disease

## 2024-09-06 ENCOUNTER — Encounter (HOSPITAL_COMMUNITY)
Admission: EM | Disposition: A | Discharge status: Home / Self Care | Payer: Self-pay | Source: Home / Self Care | Attending: Internal Medicine

## 2024-09-06 ENCOUNTER — Encounter (HOSPITAL_BASED_OUTPATIENT_CLINIC_OR_DEPARTMENT_OTHER): Admitting: Internal Medicine

## 2024-09-06 DIAGNOSIS — I5023 Acute on chronic systolic (congestive) heart failure: Secondary | ICD-10-CM

## 2024-09-06 DIAGNOSIS — I509 Heart failure, unspecified: Secondary | ICD-10-CM | POA: Diagnosis not present

## 2024-09-06 LAB — CBC
HCT: 33.9 % — ABNORMAL LOW (ref 39.0–52.0)
Hemoglobin: 11.4 g/dL — ABNORMAL LOW (ref 13.0–17.0)
MCH: 32.2 pg (ref 26.0–34.0)
MCHC: 33.6 g/dL (ref 30.0–36.0)
MCV: 95.8 fL (ref 80.0–100.0)
Platelets: 217 K/uL (ref 150–400)
RBC: 3.54 MIL/uL — ABNORMAL LOW (ref 4.22–5.81)
RDW: 17.3 % — ABNORMAL HIGH (ref 11.5–15.5)
WBC: 7.3 K/uL (ref 4.0–10.5)
nRBC: 0 % (ref 0.0–0.2)

## 2024-09-06 LAB — BASIC METABOLIC PANEL WITH GFR
Anion gap: 9 (ref 5–15)
BUN: 34 mg/dL — ABNORMAL HIGH (ref 6–20)
CO2: 24 mmol/L (ref 22–32)
Calcium: 8.2 mg/dL — ABNORMAL LOW (ref 8.9–10.3)
Chloride: 102 mmol/L (ref 98–111)
Creatinine, Ser: 2.99 mg/dL — ABNORMAL HIGH (ref 0.61–1.24)
GFR, Estimated: 23 mL/min — ABNORMAL LOW (ref >60)
Glucose, Bld: 104 mg/dL — ABNORMAL HIGH (ref 70–99)
Potassium: 4.4 mmol/L (ref 3.5–5.1)
Sodium: 135 mmol/L (ref 135–145)

## 2024-09-06 SURGERY — LIGATION OF ARTERIOVENOUS  FISTULA
Anesthesia: General | Laterality: Left

## 2024-09-06 NOTE — Progress Notes (Signed)
 [name] NEPHROLOGY PROGRESS NOTE  Assessment/ Plan: Pt is a 58 y.o. yo male with past medical history significant for asthma, CHF, ESRD status post kidney transplant in 06/2024 at [facility], HIV, hypertension, CAD was admitted with shortness of breath. The kidney transplant was complicated by delayed graft function as well as rejection.  # Post transplant CKD IV CKD stage IV of his allograft with recent treated rejection as below  Most recently appears to be Cr 2.5 - 2.8.  Creatinine level uptrending, holding further Lasix .  # S/p renal transplant 07/07/2024 at [facility] complicated by delayed graft function and Acute and Chronic/Active TCMR treated with pulse steroids at the end of October.  #Progressive orthopnea and PND; LVEF ok: ? High output from AVF S/p right heart cath with severely elevated filling pressures and recommendation to continue aggressive diuresis  Plan for AVF banding tomorrow for high OP AVF.  Discussed with the patient's nephrologist at [facility] who recommend banding then ligation because of low GFR.  Also discussed with the vascular surgeon about the plan.  Eliquis  on hold. # Immunosuppression  on Envarsus/mmf/pred at home doses.  # Chronic HIV - on home med.   #HTN - improved on current regimen, monitor BP.   #Secondary hyperparathyroidism On vitamin D  1000 units daily  Noted patient on cinacalcet , as well.  PTH level 308.  #Normocytic Anemia; hemoglobin is stable, continue to monitor.  #Hypophosphatemia: Required phosphorus repletion, encourage diet and monitor lab.  # Hypokalemia: Replete potassium chloride .  Subjective: Seen and examined.  Urine output is recorded only 500 cc.  Denies nausea, vomiting, chest pain or shortness of breath.  No event overnight. Objective Vital signs in last 24 hours: Vitals:   09/06/24 0005 09/06/24 0501 09/06/24 0735 09/06/24 0902  BP: 124/82 137/88 (!) 143/93   Pulse: 75 85 75 74  Resp: 18 18 18 18   Temp: 98.1 F  (36.7 C) 98.2 F (36.8 C) 98.6 F (37 C)   TempSrc: Oral Oral Oral   SpO2: 99% 97% 99%   Weight:  89 kg    Height:  5' 10 (1.778 m)     Weight change: 0.004 kg  Intake/Output Summary (Last 24 hours) at 09/06/2024 1040 Last data filed at 09/05/2024 2200 Gross per 24 hour  Intake 220 ml  Output --  Net 220 ml       Labs: RENAL PANEL Recent Labs  Lab 09/01/24 1433 09/02/24 0257 09/02/24 1755 09/02/24 1756 09/03/24 0252 09/04/24 0221 09/05/24 0229 09/06/24 0256  NA  --  134*   < > 137 135 136 135 135  K  --  3.8   < > 3.9 3.5 3.4* 3.4* 4.4  CL  --  101  --   --  102 100 101 102  CO2  --  20*  --   --  22 22 24 24   GLUCOSE  --  119*  --   --  99 116* 104* 104*  BUN  --  36*  --   --  34* 30* 31* 34*  CREATININE  --  2.77*  --   --  2.68* 2.61* 2.89* 2.99*  CALCIUM   --  8.6*  --   --  8.3* 8.7* 8.2* 8.2*  PHOS 1.8* 1.9*  --   --   --  2.1*  --   --   ALBUMIN   --  3.6  --   --   --   --   --   --    < > =  values in this interval not displayed.    Liver Function Tests: Recent Labs  Lab 09/02/24 0257  AST 33  ALT 59*  ALKPHOS 93  BILITOT 0.8  PROT 5.9*  ALBUMIN  3.6   No results for input(s): LIPASE, AMYLASE in the last 168 hours. No results for input(s): AMMONIA in the last 168 hours. CBC: Recent Labs    09/02/24 1756 09/03/24 0252 09/04/24 0221 09/05/24 0229 09/06/24 0256  HGB 10.5* 10.8* 10.8* 11.4* 11.4*  MCV  --  94.9 95.8 95.5 95.8    Cardiac Enzymes: No results for input(s): CKTOTAL, CKMB, CKMBINDEX, TROPONINI in the last 168 hours. CBG: No results for input(s): GLUCAP in the last 168 hours.  Iron  Studies: No results for input(s): IRON , TIBC, TRANSFERRIN, FERRITIN in the last 72 hours. Studies/Results: VAS US  DUPLEX DIALYSIS ACCESS (AVF, AVG) Result Date: 09/04/2024 DIALYSIS ACCESS Patient Name:  Albert Eaton [facility]  Date of Exam:   09/04/2024 Medical Rec #: [id number]        Accession #:    [id number] Date of Birth:  [birth year]         Patient Gender: M Patient Age:   84 years Exam Location:  [facility] Procedure:      VAS US  DUPLEX DIALYSIS ACCESS (AVF, AVG) Referring Phys: DEBBY ROBERTSON --------------------------------------------------------------------------------  Access Site: Left Upper Extremity. Access Type: Brachiobasilic AVF. History: Large left Brachiobasilic AVF with high flow rate (>2.3L/m in 2019 by          duplex at [facility]). High output heart failure by right heart cath 09/02/24. Limitations: Tortuous and aneurysmal fistula Performing Technologist: [name]  Examination Guidelines: A complete evaluation includes B-mode imaging, spectral Doppler, color Doppler, and power Doppler as needed of all accessible portions of each vessel. Unilateral testing is considered an integral part of a complete examination. Limited examinations for reoccurring indications may be performed as noted.  Findings: +--------------------+----------+-----------------+--------+ AVF                 PSV (cm/s)Flow Vol (mL/min)Comments +--------------------+----------+-----------------+--------+ Native artery inflow   209          3443                +--------------------+----------+-----------------+--------+    Summary: Arteriovenous fistula-Aneurysmal dilatation noted. *See table(s) above for measurements and observations.  Diagnosing physician: Debby Robertson Electronically signed by Debby Robertson on 09/04/2024 at 8:42:18 PM.    --------------------------------------------------------------------------------   Final     Medications: Infusions:   Scheduled Medications:  allopurinol   100 mg Oral Daily   amLODipine   5 mg Oral Daily   atorvastatin   40 mg Oral Daily   bictegravir-emtricitabine -tenofovir  AF  1 tablet Oral Daily   cholecalciferol  1,000 Units Oral Daily   cinacalcet   30 mg Oral Q supper   fluticasone  furoate-vilanterol  1 puff Inhalation Daily   heparin  injection (subcutaneous)  5,000 Units  Subcutaneous Q8H   isosorbide  mononitrate  15 mg Oral Daily   levothyroxine   25 mcg Oral Q0600   metoprolol  tartrate  12.5 mg Oral BID   mycophenolate  360 mg Oral TID   pantoprazole   40 mg Oral Daily   predniSONE   5 mg Oral Q breakfast   rOPINIRole   1 mg Oral QHS   sodium chloride  flush  3 mL Intravenous Q12H   sodium chloride  flush  3 mL Intravenous Q12H   sodium chloride  flush  3 mL Intravenous Q12H   sulfamethoxazole-trimethoprim  1 tablet Oral Q M,W,F   tacrolimus ER  8 mg Oral Daily   And   tacrolimus ER  1 mg Oral QAC breakfast   valGANciclovir  450 mg Oral Q48H    have reviewed scheduled and prn medications.  Physical Exam: General:NAD, comfortable Heart:RRR, s1s2 nl Lungs:clear b/l, no crackle Abdomen:soft, Non-tender, non-distended Extremities:No edema Dialysis Access: Aneurysmal AV fistula with good thrill.  Albert Eaton 09/06/2024,10:40 AM  LOS: 4 days

## 2024-09-06 NOTE — Progress Notes (Signed)
 Progress Note  Patient Name: Albert Eaton Date of Encounter: 09/06/2024 Primary Cardiologist: [name], MD   Subjective   Cases was delayed due to non-patient related issues. Patient has no CP, SOB, palpitations. Since RHC 09/02/24 he has diuresed an additional 5 Liters.   Vital Signs    Vitals:   09/06/24 0005 09/06/24 0501 09/06/24 0735 09/06/24 0902  BP: 124/82 137/88 (!) 143/93   Pulse: 75 85 75 74  Resp: 18 18 18 18   Temp: 98.1 F (36.7 C) 98.2 F (36.8 C) 98.6 F (37 C)   TempSrc: Oral Oral Oral   SpO2: 99% 97% 99%   Weight:  89 kg    Height:  5' 10 (1.778 m)      Intake/Output Summary (Last 24 hours) at 09/06/2024 1050 Last data filed at 09/05/2024 2200 Gross per 24 hour  Intake 220 ml  Output --  Net 220 ml   Filed Weights   09/04/24 0446 09/05/24 0456 09/06/24 0501  Weight: 90.3 kg 89 kg 89 kg    Physical Exam   GEN: No acute distress.   Neck: No JVD Cardiac: RRR, holosystolic murmur and AV bruit Respiratory: Clear to auscultation bilaterally. GI: Soft, nontender, non-distended  MS: No edema  Labs   Telemetry: SR with PACs   Chemistry Recent Labs  Lab 09/02/24 0257 09/02/24 1755 09/04/24 0221 09/05/24 0229 09/06/24 0256  NA 134*   < > 136 135 135  K 3.8   < > 3.4* 3.4* 4.4  CL 101   < > 100 101 102  CO2 20*   < > 22 24 24   GLUCOSE 119*   < > 116* 104* 104*  BUN 36*   < > 30* 31* 34*  CREATININE 2.77*   < > 2.61* 2.89* 2.99*  CALCIUM  8.6*   < > 8.7* 8.2* 8.2*  PROT 5.9*  --   --   --   --   ALBUMIN  3.6  --   --   --   --   AST 33  --   --   --   --   ALT 59*  --   --   --   --   ALKPHOS 93  --   --   --   --   BILITOT 0.8  --   --   --   --   GFRNONAA 26*   < > 28* 24* 23*  ANIONGAP 13   < > 14 10 9    < > = values in this interval not displayed.     Hematology Recent Labs  Lab 09/04/24 0221 09/05/24 0229 09/06/24 0256  WBC 6.1 7.2 7.3  RBC 3.35* 3.56* 3.54*  HGB 10.8* 11.4* 11.4*  HCT 32.1* 34.0* 33.9*   MCV 95.8 95.5 95.8  MCH 32.2 32.0 32.2  MCHC 33.6 33.5 33.6  RDW 17.5* 17.6* 17.3*  PLT 192 201 217     BNP Recent Labs  Lab 08/31/24 1612  BNP 1,578.9*      Cardiac Studies   Cardiac Studies & Procedures   ______________________________________________________________________________________________ CARDIAC CATHETERIZATION  CARDIAC CATHETERIZATION 09/02/2024  Conclusion 1.  Fick cardiac output of 13.2 L/min and Fick cardiac index of 6.2 L/min/m with the following hemodynamics: Right atrial pressure mean of 21 mmHg Right ventricular pressure 65/19 with an end-diastolic pressure of 31 mmHg Wedge pressure mean of 29 mmHg with V waves to 48 mmHg PA pressure 62/31 with a mean of 44 mmHg PVR 1.1  Woods units PA pulsatility index of 1.5  Summary: Severely elevated filling pressures; continue aggressive diuresis.   CARDIAC CATHETERIZATION  CARDIAC CATHETERIZATION 09/11/2022  Conclusion   Mid LAD lesion is 50% stenosed.   Prox LAD lesion is 50% stenosed.   Prox RCA lesion is 40% stenosed.   2nd Mrg lesion is 30% stenosed.   LV end diastolic pressure is mildly elevated.   There is no aortic valve stenosis.  No change in coronary anatomy since October 2023. Consider EP referral for consideration of AICD.  Findings Coronary Findings Diagnostic  Dominance: Left  Left Anterior Descending Vessel is large. The LAD wraps around the LV apex.  There is moderate diffuse 40 to 50% proximal LAD stenosis.  The mid and distal LAD have no obstructive disease. Prox LAD lesion is 50% stenosed. Mid LAD lesion is 50% stenosed.  Left Circumflex Vessel is large. The vessel exhibits minimal luminal irregularities. The circumflex is dominant.  There is mild nonobstructive plaquing throughout.  There are no high-grade stenoses throughout the circumflex, obtuse marginal, or posterolateral branches.  Second Obtuse Marginal Branch 2nd Mrg lesion is 30% stenosed.  Right Coronary  Artery Vessel is moderate in size. Nondominant vessel.  Mild nonobstructive proximal vessel disease of about 40%. Prox RCA lesion is 40% stenosed.  Intervention  No interventions have been documented.   STRESS TESTS  MYOCARDIAL PERFUSION IMAGING 08/05/2024  Interpretation Summary   Findings are consistent with no ischemia and no infarction. The study is low risk.   No ST deviation was noted. Arrhythmias during stress: occasional PVCs. Arrhythmias during recovery: occasional PVCs.   LV perfusion is normal. There is no evidence of ischemia. There is no evidence of infarction.   Left ventricular function is abnormal. Global function is mildly reduced. Nuclear stress EF: 49%. The left ventricular ejection fraction is mildly decreased (45-54%). End diastolic cavity size is severely enlarged. End systolic cavity size is moderately enlarged. No evidence of transient ischemic dilation (TID) noted.   Prior study available for comparison. No changes compared to prior study.   ECHOCARDIOGRAM  ECHOCARDIOGRAM COMPLETE 09/01/2024  Narrative ECHOCARDIOGRAM REPORT    Patient Name:   Albert Eaton Date of Exam: 09/01/2024 Medical Rec #:  [id number]       Height:       70.0 in Accession #:    [id number]      Weight:       205.0 lb Date of Birth:  30-Dec-1965        BSA:          2.109 m Patient Age:    58 years        BP:           166/89 mmHg Patient Gender: M               HR:           88 bpm. Exam Location:  Inpatient  Procedure: 2D Echo, Cardiac Doppler and Color Doppler (Both Spectral and Color Flow Doppler were utilized during procedure).  Indications:    Congestive Heart Failure I50.9  History:        Patient has no prior history of Echocardiogram examinations, most recent 03/03/2024.  Mitral Valve: bioprosthetic valve valve is present in the mitral position.  Sonographer:    Jayson Gaskins Referring Phys: STEVEN J NEWTON  IMPRESSIONS   1. Left ventricular ejection fraction, by  estimation, is 50 to 55%. The left ventricle has low normal function. The left ventricle  has no regional wall motion abnormalities. The left ventricular internal cavity size was mildly dilated. There is moderate concentric left ventricular hypertrophy. Left ventricular diastolic parameters are indeterminate. 2. Right ventricular systolic function is normal. The right ventricular size is moderately enlarged. There is moderately elevated pulmonary artery systolic pressure. The estimated right ventricular systolic pressure is 47.3 mmHg. 3. Left atrial size was severely dilated. 4. Right atrial size was severely dilated. 5. The mitral valve has been repaired/replaced. Mild mitral valve regurgitation. No evidence of mitral stenosis. There is a bioprosthetic valve present in the mitral position. 6. The tricuspid valve is degenerative. Tricuspid valve regurgitation is severe. 7. The aortic valve has an indeterminant number of cusps. There is mild calcification of the aortic valve. Aortic valve regurgitation is trivial. Aortic valve sclerosis/calcification is present, without any evidence of aortic stenosis. 8. There is mild dilatation of the ascending aorta, measuring 38 mm. 9. The inferior vena cava is dilated in size with <50% respiratory variability, suggesting right atrial pressure of 15 mmHg.  FINDINGS Left Ventricle: Left ventricular ejection fraction, by estimation, is 50 to 55%. The left ventricle has low normal function. The left ventricle has no regional wall motion abnormalities. The left ventricular internal cavity size was mildly dilated. There is moderate concentric left ventricular hypertrophy. Left ventricular diastolic parameters are indeterminate.  Right Ventricle: The right ventricular size is moderately enlarged. No increase in right ventricular wall thickness. Right ventricular systolic function is normal. There is moderately elevated pulmonary artery systolic pressure. The tricuspid  regurgitant velocity is 2.84 m/s, and with an assumed right atrial pressure of 15 mmHg, the estimated right ventricular systolic pressure is 47.3 mmHg.  Left Atrium: Left atrial size was severely dilated.  Right Atrium: Right atrial size was severely dilated.  Pericardium: There is no evidence of pericardial effusion. Presence of epicardial fat layer.  Mitral Valve: The mitral valve has been repaired/replaced. Mild mitral valve regurgitation. There is a bioprosthetic valve present in the mitral position. No evidence of mitral valve stenosis. MV peak gradient, 10.4 mmHg. The mean mitral valve gradient is 3.0 mmHg.  Tricuspid Valve: The tricuspid valve is degenerative in appearance. Tricuspid valve regurgitation is severe.  Aortic Valve: The aortic valve has an indeterminant number of cusps. There is mild calcification of the aortic valve. Aortic valve regurgitation is trivial. Aortic valve sclerosis/calcification is present, without any evidence of aortic stenosis. Aortic valve mean gradient measures 4.0 mmHg. Aortic valve peak gradient measures 4.8 mmHg. Aortic valve area, by VTI measures 4.02 cm.  Pulmonic Valve: The pulmonic valve was grossly normal. Pulmonic valve regurgitation is mild.  Aorta: The aortic root is normal in size and structure. There is mild dilatation of the ascending aorta, measuring 38 mm.  Venous: The inferior vena cava is dilated in size with less than 50% respiratory variability, suggesting right atrial pressure of 15 mmHg.  IAS/Shunts: No atrial level shunt detected by color flow Doppler.   LEFT VENTRICLE PLAX 2D LVIDd:         5.70 cm   Diastology LVIDs:         4.25 cm   LV e' medial:    9.03 cm/s LV PW:         1.40 cm   LV E/e' medial:  16.4 LV IVS:        1.60 cm   LV e' lateral:   10.60 cm/s LVOT diam:     2.00 cm   LV E/e' lateral: 14.0 LV SV:  74 LV SV Index:   35 LVOT Area:     3.14 cm   RIGHT VENTRICLE RV S prime:     9.68  cm/s  LEFT ATRIUM              Index        RIGHT ATRIUM           Index LA Vol (A2C):   94.4 ml  44.75 ml/m  RA Area:     34.20 cm LA Vol (A4C):   111.0 ml 52.62 ml/m  RA Volume:   130.00 ml 61.63 ml/m LA Biplane Vol: 105.0 ml 49.78 ml/m AORTIC VALVE AV Area (Vmax):    3.37 cm AV Area (Vmean):   3.19 cm AV Area (VTI):     4.02 cm AV Vmax:           110.00 cm/s AV Vmean:          92.400 cm/s AV VTI:            0.185 m AV Peak Grad:      4.8 mmHg AV Mean Grad:      4.0 mmHg LVOT Vmax:         118.00 cm/s LVOT Vmean:        93.900 cm/s LVOT VTI:          0.237 m LVOT/AV VTI ratio: 1.28  AORTA Ao Root diam: 3.50 cm  MITRAL VALVE                TRICUSPID VALVE MV Area (PHT): 3.37 cm     TR Peak grad:   32.3 mmHg MV Area VTI:   2.85 cm     TR Vmax:        284.00 cm/s MV Peak grad:  10.4 mmHg MV Mean grad:  3.0 mmHg     SHUNTS MV Vmax:       1.61 m/s     Systemic VTI:  0.24 m MV Vmean:      81.1 cm/s    Systemic Diam: 2.00 cm MV Decel Time: 225 msec MV E velocity: 148.00 cm/s MV A velocity: 85.90 cm/s MV E/A ratio:  1.72  Toribio Fuel MD Electronically signed by Toribio Fuel MD Signature Date/Time: 09/01/2024/11:45:33 PM    Final   TEE  ECHO INTRAOPERATIVE TEE 08/21/2022  Narrative *INTRAOPERATIVE TRANSESOPHAGEAL REPORT *    Patient Name:   Albert Eaton Date of Exam: 08/21/2022 Medical Rec #:  [id number]       Height:       70.0 in Accession #:    [id number]      Weight:       206.0 lb Date of Birth:  1965/11/12        BSA:          2.11 m Patient Age:    56 years        BP:           122/81 mmHg Patient Gender: M               HR:           77 bpm. Exam Location:  Inpatient  Transesophogeal exam was perform intraoperatively during surgical procedure. Patient was closely monitored under general anesthesia during the entirety of examination.  Indications:     mitral regurgitation Sonographer:     Tinnie Barefoot RDCS Performing Phys:  [id number] DEWARD KALLMAN Diagnosing Phys: Juliene Clinton MD  Complications: No known complications during  this procedure. POST-OP IMPRESSIONS _ Mitral Valve: No stenosis present. There is trace regurgitation. The gradient recorded across the prosthetic valve is within the expected range. The mean PG across the repaired mitral valve is 3 mmHg.The mitral valve is status post repair with an annuloplasty ring.  PRE-OP  FINDINGS Left Ventricle: The left ventricle has low normal systolic function, with an ejection fraction of 50-55%. The cavity size was normal. There is mildly increased left ventricular wall thickness. There is mild concentric left ventricular hypertrophy.   Right Ventricle: The right ventricle has normal systolic function. The cavity was normal. There is no increase in right ventricular wall thickness.  Left Atrium: Left atrial size was dilated. No left atrial/left atrial appendage thrombus was detected.  Right Atrium: Right atrial size was dilated.  Interatrial Septum: No atrial level shunt detected by color flow Doppler.  Pericardium: There is no evidence of pericardial effusion.  Mitral Valve: The mitral valve is dilated. Mitral valve regurgitation is severe by color flow Doppler. The MR jet is centrally-directed. There is No evidence of mitral stenosis. Mitral valve annulus is dilated with an anterior-posterior dimension of 4.12cm. Diameter in the commissural view is 3.76cm. Regurgitant jet is severe with a dense signal on CWD. No flow reversal is noted in pulmonary veins.  Tricuspid Valve: The tricuspid valve was normal in structure. Tricuspid valve regurgitation is moderate by color flow Doppler. The jet is directed centrally. No evidence of tricuspid stenosis is present.  Aortic Valve: The aortic valve is tricuspid Aortic valve regurgitation was not visualized by color flow Doppler. There is no stenosis of the aortic valve.   Pulmonic Valve: The pulmonic valve was normal  in structure. Pulmonic valve regurgitation is not visualized by color flow Doppler.   Aorta: The aortic root, ascending aorta and aortic arch are normal in size and structure.  +-------------+-----------++ +---------------+-----------++ MR Peak grad:79.2 mmHg   TRICUSPID VALVE            +-------------+-----------++ +---------------+-----------++ MR Mean grad:49.0 mmHg   TR Peak grad:  32.5 mmHg   +-------------+-----------++ +---------------+-----------++ MR Vmax:     445.00 cm/s TR Vmax:       285.00 cm/s +-------------+-----------++ +---------------+-----------++ MR Vmean:    324.0 cm/s  +-------------+-----------++   Juliene Clinton MD Electronically signed by Juliene Clinton MD Signature Date/Time: 08/22/2022/11:46:22 AM    Final  MONITORS  LONG TERM MONITOR (3-14 DAYS) 06/05/2023  Narrative Patch Wear Time:  13 days and 23 hours (2024-07-14T22:15:08-399 to 2024-07-28T22:15:04-399)  Patient had a min HR of 53 bpm, max HR of 193 bpm, and avg HR of 81 bpm. Predominant underlying rhythm was Sinus Rhythm. 1 run of Ventricular Tachycardia occurred lasting 4 beats with a max rate of 193 bpm (avg 182 bpm). Atrial Flutter occurred (2% burden), ranging from 96-177 bpm (avg of 132 bpm), the longest lasting 33 mins 59 secs with an avg rate of 137 bpm. Atrial Flutter may be possible Atrial Tachycardia with variable block. Isolated SVEs were occasional (3.5%, 57397), SVE Couplets were occasional (1.0%, 8279), and SVE Triplets were rare (<1.0%, 1481). Isolated VEs were occasional (2.4%, 38757), VE Couplets were rare (<1.0%, 2424), and VE Triplets were rare (<1.0%, 27). Ventricular Bigeminy and Trigeminy were present.  SUMMARY: Normal sinus with average HR of 81 bpm. Atrial flutter occurs with a 2% burden, average heart rate during atrial flutter is 137 bpm. Occasional PVC's with a burden of 2.4%.   CT SCANS  CT CORONARY FRACTIONAL FLOW RESERVE DATA PREP  10/11/2020  Narrative EXAM: FFRCT ANALYSIS  FINDINGS: FFRct analysis was performed on the original cardiac CT angiogram dataset. Diagrammatic representation of the FFRct analysis is provided in a separate PDF document in PACS. This dictation was created using the PDF document and an interactive 3D model of the results. 3D model is not available in the EMR/PACS. Normal FFR range is >0.80.  1. Left Main:No significant stenosis. LM FFR = 0.99.  2. LAD: No significant stenosis. Proximal FFR = 0.99, Mid FFR = 0.94, Distal FFR = 0.86.  3. LCX: No significant stenosis. Proximal FFR = 1.00, Mid FFR = 1.00, Distal FFR = 1.00. PDA FFR = 0.84, LPLA FFR = 0.88.  4. OM: No significant stenosis. Proximal FFR = 1.00 and mid FFR = 0.98.  5. RCA: No significant stenosis. Proximal FFR = 0.99 and mid FFR = 0.87.  IMPRESSION: 1. Coronary CTA FFR flow analysis demonstrates no hemodynamically flow limiting lesion.  Wilbert Turner   Electronically Signed By: Wilbert Bihari On: 10/17/2020 12:15   CT SCANS  CT CORONARY MORPH W/CTA COR W/SCORE 10/10/2020  Addendum 10/11/2020 11:39 AM ADDENDUM REPORT: 10/11/2020 11:37  EXAM: Cardiac/Coronary  CT  TECHNIQUE: The patient was scanned on a [employer].  FINDINGS: A 120 kV prospective scan was triggered in the descending thoracic aorta at 111 HU's. Axial non-contrast 3 mm slices were carried out through the heart. The data set was analyzed on a dedicated work station and scored using the Agatson method. Gantry rotation speed was 250 msecs and collimation was .6 mm. No beta blockade and 0.8 mg of sl NTG was given. The 3D data set was reconstructed in 5% intervals of the 67-82 % of the R-R cycle. Diastolic phases were analyzed on a dedicated work station using MPR, MIP and VRT modes. The patient received 80 cc of contrast.  Aorta: Mildly dilated ascending aorta at 38mm. No calcifications. No dissection.  Aortic Valve:   Trileaflet.  No calcifications.  Coronary Arteries:  Normal coronary origin.  Right dominance.  RCA is a small non dominant artery that gives rise to PDA and PLVB. There is mild calcified plaque in the proximal RCA with associated stenosis of 25-49%. There is significant noise artifact prohibiting accurate assessment for non calcified plaque.  Left main is a large artery that gives rise to LAD and LCX arteries. There is minimal calcified plaque in the proximal and distal LM with associated stenosis of 1-24%.  LAD is a large vessel that gives rise to 3 moderate to large sized diagonals. There is mild mixed plaque in the ostial and proximal LAD with associated stenosis of 25-49% and scattered mild calcified plaque in the mid LAD with associated stenosis of 25-49%.  LCX is a dominant artery that gives rise to one large OM1 branch and then a moderate sized LPLA and LPDA. There is mild calcified plaque in the proximal OM1 and proximal PDA with associated stenosis of 25-49%.  Other findings:  Normal pulmonary vein drainage into the left atrium.  Normal let atrial appendage without a thrombus.  Normal size of the pulmonary artery.  IMPRESSION: 1. Coronary calcium  score of 437. This was 98th percentile for age and sex matched control.  2.  Normal coronary origin with left dominance.  3.  Mild atherosclerosis.  CAD RADS 2.  4. Recommend preventive pharmacotherapy and risk factor modification.  5.  This study has been sent for FFR flow analysis.  Wilbert Bihari   Electronically Signed By: Wilbert Bihari On: 10/11/2020  11:37  Narrative EXAM: OVER-READ INTERPRETATION  CT CHEST  The following report is an over-read performed by radiologist Dr. Newell Eke of [facility] on 10/10/2020. This over-read does not include interpretation of cardiac or coronary anatomy or pathology. The coronary calcium  score/coronary CTA interpretation by the cardiologist is  attached.  COMPARISON:  08/30/2020.  FINDINGS: Vascular: Pulmonic trunk and heart are enlarged. No pericardial effusion.  Mediastinum/Nodes: No pathologically enlarged lymph nodes. Distal esophagus is unremarkable.  Lungs/Pleura: Minimal dependent atelectasis bilaterally. Lungs are otherwise clear.  Upper Abdomen: Visualized portions of the liver, spleen and stomach are grossly unremarkable.  Musculoskeletal: None.  IMPRESSION: 1. No acute extracardiac findings. 2. Enlarged pulmonic trunk, indicative of pulmonary arterial hypertension.  Electronically Signed: By: Newell Eke M.D. On: 10/10/2020 09:42     ______________________________________________________________________________________________        Assessment & Plan   HFpEF; High output HF - agree with Dr. Dolan; I have stopped IV lasix ; recommend 40 mg PO daily home dose  HTN - norvasc  5 mg, on Imdur  15 mg PO daily; if BP issues increase norvasc  to 10 mg  CAD - continue metoprolol  12.5 mg PO BID, LVEF recovered from prior s/p MVR HLD with Aortic atherosclerosis- continue atorvastatin  40 mg - MR s/p MVR (minimally invasive 10/23)   PAF - on eliquis  5 mg PO BID, currently with just PACs   As per Primary or other consultants: HTN Hypothyroidism HIV; Heb B Renal CA s/p L Nephrectomy OSA  We are presently making minimal changes to his care.  Will sign off at this time; we are happy to re-engage if clinical syndrome changes.  Planned for Vascular surgery/OR 11/12 as per [facility] note.  We will arrange f/u with Dr. Margurite team  For questions or updates, please contact [facility] Please consult [email or web address] for contact info under Cardiology/STEMI.      Stanly Leavens, MD FASE [facility] Cardiologist [facility]  [address] [place], [address] [place], [place] [address] [phone]  10:51 AM

## 2024-09-06 NOTE — Plan of Care (Signed)
   Problem: Education: Goal: Knowledge of General Education information will improve Description: Including pain rating scale, medication(s)/side effects and non-pharmacologic comfort measures Outcome: Progressing   Problem: Clinical Measurements: Goal: Diagnostic test results will improve Outcome: Progressing

## 2024-09-06 NOTE — Progress Notes (Signed)
 [facility] PROGRESS NOTE  Patient: Albert Eaton FMW:[id number]   PCP: [name], NP DOB: 25-Feb-1966   DOA: 08/31/2024   DOS: 09/06/2024    Subjective: Denies any acute complaint.  Breathing better.  No nausea no vomiting.  Objective:  Vitals:   09/06/24 0735 09/06/24 0902 09/06/24 1122 09/06/24 1617  BP: (!) 143/93  (!) 145/91 135/86  Pulse: 75 74 77 73  Resp: 18 18 20 18   Temp: 98.6 F (37 C)  97.8 F (36.6 C) 98.4 F (36.9 C)  TempSrc: Oral  Oral Oral  SpO2: 99%  100% 98%  Weight:      Height:       Basal crackles. Bowel sound present S1-S2 present No edema.  Assessment and plan: Acute on chronic diastolic CHF. ESRD with AV fistula. Currently scheduled for fistula banding tomorrow. Monitor.  N.p.o. after midnight.  Author: Yetta Blanch, MD [facility] 09/06/2024 6:37 PM   If 7PM-7AM, please contact night-coverage at [email or web address]

## 2024-09-06 NOTE — Progress Notes (Signed)
 Mobility Specialist Progress Note:    09/06/24 1110  Mobility  Activity Ambulated with assistance  Level of Assistance Independent after set-up  Assistive Device None  Distance Ambulated (ft) 500 ft  Range of Motion/Exercises Active  Activity Response Tolerated well  Mobility Referral Yes  Mobility visit 1 Mobility  Mobility Specialist Start Time (ACUTE ONLY) 1110  Mobility Specialist Stop Time (ACUTE ONLY) 1118  Mobility Specialist Time Calculation (min) (ACUTE ONLY) 8 min   Received pt laying in bed agreeable to session. No c/o any symptoms. Pt moving and ambulating well. Returned pt to recliner w/ all needs met.   Venetia Keel Mobility Specialist Please Neurosurgeon or Rehab Office at [phone]

## 2024-09-06 NOTE — Plan of Care (Signed)

## 2024-09-06 NOTE — Progress Notes (Signed)
  Progress Note    09/06/2024 9:03 AM 4 Days Post-Op  Subjective:  no complaints   Vitals:   09/06/24 0501 09/06/24 0735  BP: 137/88 (!) 143/93  Pulse: 85 75  Resp: 18 18  Temp: 98.2 F (36.8 C) 98.6 F (37 C)  SpO2: 97% 99%   Physical Exam: Lungs:  non labored Extremities:  palpable L radial; palpable thrill L AVF Neurologic: a&O  CBC    Component Value Date/Time   WBC 7.3 09/06/2024 0256   RBC 3.54 (L) 09/06/2024 0256   HGB 11.4 (L) 09/06/2024 0256   HGB 11.9 (L) 05/05/2023 1012   HCT 33.9 (L) 09/06/2024 0256   HCT 34.8 (L) 05/05/2023 1012   PLT 217 09/06/2024 0256   PLT 285 05/05/2023 1012   MCV 95.8 09/06/2024 0256   MCV 96 05/05/2023 1012   MCH 32.2 09/06/2024 0256   MCHC 33.6 09/06/2024 0256   RDW 17.3 (H) 09/06/2024 0256   RDW 16.2 (H) 05/05/2023 1012   LYMPHSABS 0.6 (L) 08/31/2024 1612   LYMPHSABS 2.2 09/20/2018 0936   MONOABS 0.6 08/31/2024 1612   EOSABS 0.0 08/31/2024 1612   EOSABS 0.1 09/20/2018 0936   BASOSABS 0.0 08/31/2024 1612   BASOSABS 0.0 09/20/2018 0936    BMET    Component Value Date/Time   NA 135 09/06/2024 0256   NA 141 07/25/2022 1244   K 4.4 09/06/2024 0256   CL 102 09/06/2024 0256   CO2 24 09/06/2024 0256   GLUCOSE 104 (H) 09/06/2024 0256   BUN 34 (H) 09/06/2024 0256   BUN 18 07/25/2022 1244   CREATININE 2.99 (H) 09/06/2024 0256   CREATININE 3.23 (H) 08/22/2024 1114   CALCIUM  8.2 (L) 09/06/2024 0256   GFRNONAA 23 (L) 09/06/2024 0256   GFRNONAA 17 (L) 08/31/2017 0908   GFRAA 8 (L) 09/28/2020 1012   GFRAA 20 (L) 08/31/2017 0908    INR    Component Value Date/Time   INR 1.3 (H) 08/03/2024 0500   INR 1.3 04/21/2024 1402     Intake/Output Summary (Last 24 hours) at 09/06/2024 0903 Last data filed at 09/05/2024 2200 Gross per 24 hour  Intake 220 ml  Output 500 ml  Net -280 ml     Assessment/Plan:  58 y.o. male with   1) large left brachiobasilic AVF with high flow rate (>2.3L/m in 2019 by duplex) 2) CKD IV  after kidney transplantation 07/23/24 at [facility] 2/2 rejection and delayed graft function 3) High output heart failure by right heart cath 09/02/24 4 Days Post-Op   Continue to hold Eliquis .  Plan is for L arm AVF banding in the OR likely tomorrow.  Case was discussed in detail with the patient and he is agreeable to proceed.   Donnice Sender, PA-C [facility] [phone] 09/06/2024 9:03 AM

## 2024-09-07 DIAGNOSIS — I509 Heart failure, unspecified: Secondary | ICD-10-CM | POA: Diagnosis not present

## 2024-09-07 LAB — CBC
HCT: 34.7 % — ABNORMAL LOW (ref 39.0–52.0)
Hemoglobin: 11.6 g/dL — ABNORMAL LOW (ref 13.0–17.0)
MCH: 32 pg (ref 26.0–34.0)
MCHC: 33.4 g/dL (ref 30.0–36.0)
MCV: 95.9 fL (ref 80.0–100.0)
Platelets: 213 K/uL (ref 150–400)
RBC: 3.62 MIL/uL — ABNORMAL LOW (ref 4.22–5.81)
RDW: 17.2 % — ABNORMAL HIGH (ref 11.5–15.5)
WBC: 6.9 K/uL (ref 4.0–10.5)
nRBC: 0 % (ref 0.0–0.2)

## 2024-09-07 LAB — BASIC METABOLIC PANEL WITH GFR
Anion gap: 15 (ref 5–15)
BUN: 37 mg/dL — ABNORMAL HIGH (ref 6–20)
CO2: 20 mmol/L — ABNORMAL LOW (ref 22–32)
Calcium: 8.8 mg/dL — ABNORMAL LOW (ref 8.9–10.3)
Chloride: 104 mmol/L (ref 98–111)
Creatinine, Ser: 3.19 mg/dL — ABNORMAL HIGH (ref 0.61–1.24)
GFR, Estimated: 22 mL/min — ABNORMAL LOW (ref >60)
Glucose, Bld: 98 mg/dL (ref 70–99)
Potassium: 4.2 mmol/L (ref 3.5–5.1)
Sodium: 139 mmol/L (ref 135–145)

## 2024-09-07 NOTE — Progress Notes (Signed)
 Mobility Specialist Progress Note:    09/07/24 1017  Mobility  Activity Ambulated independently  Level of Assistance Independent after set-up  Assistive Device None  Distance Ambulated (ft) 500 ft  Range of Motion/Exercises Active  Activity Response Tolerated well  Mobility Referral Yes  Mobility visit 1 Mobility  Mobility Specialist Start Time (ACUTE ONLY) 1017  Mobility Specialist Stop Time (ACUTE ONLY) 1023  Mobility Specialist Time Calculation (min) (ACUTE ONLY) 6 min   Received pt laying in bed agreeable to session. No c/o any symptoms. Pt moving and ambulating well. Returned pt to room w/ all needs met.  Venetia Keel Mobility Specialist Please Neurosurgeon or Rehab Office at [phone]

## 2024-09-07 NOTE — Plan of Care (Signed)
 Patient is even tempered and has a good attitude. It has been three days where they might perform the fistula ligation, but has been bumped to a different day. Tomorrow will be day four. Patient states if the procedure is not done tomorrow, he will be going home.

## 2024-09-07 NOTE — Progress Notes (Signed)
 [facility] Progress Note Patient: Albert Eaton FMW:[id number] DOB: 04-01-1966  DOA: 08/31/2024 DOS: the patient was seen and examined on 09/07/2024  Brief Hospital Course: 58/M with chronic systolic CHF, ESRD recent renal transplant on 07/07/2024, HIV, hypertension, hypothyroidism, OSA, CAD, atrial flutter presented to the ED with ongoing dyspnea on exertion and severe orthopnea for 3 months.  Symptoms started prior to his renal transplant and have persisted since, only minimal swelling noted compliant with home antirejection medications.  In the ED vital stable, WBC 7, hemoglobin 14, troponin 40, 50 creatinine 2.4, BNP 1579, chest x-ray with mild bibasilar edema. - Admitted, cards and nephrology consulting, started on diuretics,    Assessment and Plan: Acute on chronic diastolic CHF 2D echo May 2025 with EF of 50 to 55%, moderately elevated PASP, normal RV Concern for high-output CHF from large AV fistula,  RHC with significantly elevated filling pressures, CO 13.2, cardiac index 6.2, Diuresing well, Cardiology recommended fistula ligation.  Vascular surgery arranging the procedure on Thursday for banding.   History of ESRD status post renal transplant Baseline ESRD on hemodialysis status post DDKT July 07, 2024 in the [facility] system On immunosuppressant regimen including Myfortic, tacrolimus, prednisone  CMV ppx: Valcyte x 3 months (EOT 10/06/2024)  PJP ppx:Bactrim x 6 months (EOT 01/12/2025 unless CD4 <200) Appreciate nephrology input   Atrial flutter, unspecified type  Noted prior history of atrial fibrillation and atrial flutter Was previously on Coumadin  Looks to be on hold from recent renal transplant September 2025 On anticoagulation.  With Eliquis  now on hold for procedure.   CAD (coronary artery disease) Stable, continue aspirin  and Crestor    Obstructive sleep apnea CPAP   Chronic kidney disease, stage IV Creatinine continues to trend upward Baseline appears to be  2.5-2.8. Prior history of renal transplant.  Most likely will require dialysis in the near future. Has AV fistula.  Which will be banded for high-output failure. Renal transplant September 2025 at [facility] complicated by delayed graft function. On steroids   Gout arthritis Continue allopurinol    HIV disease Biktarvy  continued, follow-up with infectious disease   Subjective: No nausea or vomiting.  No other acute complaint.  No fever no chills.  Physical Exam: Clear to auscultation. S1-S2 present Bowel sounds. No edema  Data Reviewed: I have Reviewed nursing notes, Vitals, and Lab results. Since last encounter, pertinent lab results CBC and BMP   . I have ordered test including CBC and BMP  .   Disposition: Status is: Inpatient Remains inpatient appropriate because: Monitor for improvement Shortness Family Communication: No one to bedside Level of care: Telemetry   Vitals:   09/07/24 0900 09/07/24 0919 09/07/24 1225 09/07/24 1520  BP: (!) 143/92 (!) 143/92  125/79  Pulse: 74 74 77 77  Resp: 16  17 15   Temp: 98.1 F (36.7 C)  98 F (36.7 C) 98.3 F (36.8 C)  TempSrc: Oral  Oral Oral  SpO2:   98% 100%  Weight:      Height:         Author: Yetta Blanch, MD 09/07/2024 6:56 PM  Please look on [email or web address] to find out who is on call.

## 2024-09-07 NOTE — Plan of Care (Signed)

## 2024-09-07 NOTE — Progress Notes (Signed)
 [name] NEPHROLOGY PROGRESS NOTE  Assessment/ Plan: Pt is a 58 y.o. yo male with past medical history significant for asthma, CHF, ESRD status post kidney transplant in 06/2024 at [facility], HIV, hypertension, CAD was admitted with shortness of breath. The kidney transplant was complicated by delayed graft function as well as rejection.  # Post transplant CKD IV: Baseline creatinine level seems to be around 2.5-2.8, holding further diuretics as creatinine is trending up.  He was n.p.o. last night however the procedure is now postponed to tomorrow.    # S/p renal transplant 07/07/2024 at [facility] complicated by delayed graft function and Acute and Chronic/Active TCMR treated with pulse steroids at the end of October.  #Progressive orthopnea and PND; LVEF ok: ? High output from AVF. -S/p right heart cath with severely elevated filling pressures and recommendation to continue aggressive diuresis .  Treated with IV diuretics currently on hold because of rising creatinine level. -Plan for AVF banding tomorrow for high OP AVF.  Discussed with the patient's nephrologist at [facility] who recommend banding than ligation because of low GFR.  Also discussed with the vascular surgeon about the plan.  Eliquis  on hold.  # Immunosuppression:on Envarsus/mmf/pred at home doses.  # Chronic HIV - on home med.   #HTN - improved on current regimen, monitor BP.   #Secondary hyperparathyroidism: PTH 380, noted patient is on Cinacalcet  and vitamin D .  Monitor lab.  #Normocytic Anemia; hemoglobin is stable, continue to monitor.  #Hypophosphatemia: Required phosphorus repletion, encourage diet and monitor lab.  # Hypokalemia: Replete potassium chloride .  Subjective: Seen and examined.  Urine output is recorded around 1 L.  Denies nausea, vomiting, chest pain or shortness of breath.  No event overnight.  Vascular procedure postponed to tomorrow Objective Vital signs in last 24 hours: Vitals:   09/07/24 0742  09/07/24 0805 09/07/24 0900 09/07/24 0919  BP:  (!) 132/90 (!) 143/92 (!) 143/92  Pulse: 80 70 74 74  Resp: 18 18 16    Temp:  98.5 F (36.9 C) 98.1 F (36.7 C)   TempSrc:  Oral Oral   SpO2:  97%    Weight:      Height:       Weight change: -0.4 kg  Intake/Output Summary (Last 24 hours) at 09/07/2024 1135 Last data filed at 09/06/2024 1900 Gross per 24 hour  Intake 100 ml  Output 400 ml  Net -300 ml       Labs: RENAL PANEL Recent Labs  Lab 09/01/24 1433 09/02/24 0257 09/02/24 1755 09/03/24 0252 09/04/24 0221 09/05/24 0229 09/06/24 0256 09/07/24 0306  NA  --  134*   < > 135 136 135 135 139  K  --  3.8   < > 3.5 3.4* 3.4* 4.4 4.2  CL  --  101  --  102 100 101 102 104  CO2  --  20*  --  22 22 24 24  20*  GLUCOSE  --  119*  --  99 116* 104* 104* 98  BUN  --  36*  --  34* 30* 31* 34* 37*  CREATININE  --  2.77*  --  2.68* 2.61* 2.89* 2.99* 3.19*  CALCIUM   --  8.6*  --  8.3* 8.7* 8.2* 8.2* 8.8*  PHOS 1.8* 1.9*  --   --  2.1*  --   --   --   ALBUMIN   --  3.6  --   --   --   --   --   --    < > =  values in this interval not displayed.    Liver Function Tests: Recent Labs  Lab 09/02/24 0257  AST 33  ALT 59*  ALKPHOS 93  BILITOT 0.8  PROT 5.9*  ALBUMIN  3.6   No results for input(s): LIPASE, AMYLASE in the last 168 hours. No results for input(s): AMMONIA in the last 168 hours. CBC: Recent Labs    09/03/24 0252 09/04/24 0221 09/05/24 0229 09/06/24 0256 09/07/24 0306  HGB 10.8* 10.8* 11.4* 11.4* 11.6*  MCV 94.9 95.8 95.5 95.8 95.9    Cardiac Enzymes: No results for input(s): CKTOTAL, CKMB, CKMBINDEX, TROPONINI in the last 168 hours. CBG: No results for input(s): GLUCAP in the last 168 hours.  Iron  Studies: No results for input(s): IRON , TIBC, TRANSFERRIN, FERRITIN in the last 72 hours. Studies/Results: No results found.   Medications: Infusions:   Scheduled Medications:  allopurinol   100 mg Oral Daily   amLODipine   5 mg  Oral Daily   atorvastatin   40 mg Oral Daily   bictegravir-emtricitabine -tenofovir  AF  1 tablet Oral Daily   cholecalciferol  1,000 Units Oral Daily   cinacalcet   30 mg Oral Q supper   fluticasone  furoate-vilanterol  1 puff Inhalation Daily   isosorbide  mononitrate  15 mg Oral Daily   levothyroxine   25 mcg Oral Q0600   metoprolol  tartrate  12.5 mg Oral BID   mycophenolate  360 mg Oral TID   pantoprazole   40 mg Oral Daily   predniSONE   5 mg Oral Q breakfast   rOPINIRole   1 mg Oral QHS   sodium chloride  flush  3 mL Intravenous Q12H   sodium chloride  flush  3 mL Intravenous Q12H   sodium chloride  flush  3 mL Intravenous Q12H   sulfamethoxazole-trimethoprim  1 tablet Oral Q M,W,F   tacrolimus ER  8 mg Oral Daily   And   tacrolimus ER  1 mg Oral QAC breakfast   valGANciclovir  450 mg Oral Q48H    have reviewed scheduled and prn medications.  Physical Exam: General:NAD, comfortable Heart:RRR, s1s2 nl Lungs:clear b/l, no crackle Abdomen:soft, Non-tender, non-distended Extremities:No edema Dialysis Access: Aneurysmal AV fistula with good thrill.  Albert Eaton Albert Eaton 09/07/2024,11:35 AM  LOS: 5 days

## 2024-09-08 ENCOUNTER — Encounter (HOSPITAL_COMMUNITY): Admission: EM | Disposition: A | Payer: Self-pay | Source: Home / Self Care | Attending: Internal Medicine

## 2024-09-08 ENCOUNTER — Encounter (HOSPITAL_COMMUNITY): Payer: Self-pay | Admitting: Family Medicine

## 2024-09-08 ENCOUNTER — Other Ambulatory Visit (HOSPITAL_COMMUNITY): Payer: Self-pay

## 2024-09-08 ENCOUNTER — Inpatient Hospital Stay (HOSPITAL_COMMUNITY): Admitting: Anesthesiology

## 2024-09-08 ENCOUNTER — Encounter (HOSPITAL_COMMUNITY)
Admission: EM | Disposition: A | Discharge status: Home / Self Care | Payer: Self-pay | Source: Home / Self Care | Attending: Internal Medicine

## 2024-09-08 DIAGNOSIS — N186 End stage renal disease: Secondary | ICD-10-CM

## 2024-09-08 DIAGNOSIS — I5033 Acute on chronic diastolic (congestive) heart failure: Secondary | ICD-10-CM | POA: Diagnosis not present

## 2024-09-08 DIAGNOSIS — I132 Hypertensive heart and chronic kidney disease with heart failure and with stage 5 chronic kidney disease, or end stage renal disease: Secondary | ICD-10-CM

## 2024-09-08 DIAGNOSIS — I5083 High output heart failure: Secondary | ICD-10-CM | POA: Diagnosis not present

## 2024-09-08 DIAGNOSIS — T82590A Other mechanical complication of surgically created arteriovenous fistula, initial encounter: Secondary | ICD-10-CM

## 2024-09-08 DIAGNOSIS — Z87891 Personal history of nicotine dependence: Secondary | ICD-10-CM | POA: Diagnosis not present

## 2024-09-08 DIAGNOSIS — N184 Chronic kidney disease, stage 4 (severe): Secondary | ICD-10-CM | POA: Diagnosis not present

## 2024-09-08 DIAGNOSIS — I5023 Acute on chronic systolic (congestive) heart failure: Secondary | ICD-10-CM

## 2024-09-08 DIAGNOSIS — Z992 Dependence on renal dialysis: Secondary | ICD-10-CM

## 2024-09-08 DIAGNOSIS — T8611 Kidney transplant rejection: Secondary | ICD-10-CM | POA: Diagnosis not present

## 2024-09-08 HISTORY — PX: REVISON OF ARTERIOVENOUS FISTULA: SHX6074

## 2024-09-08 LAB — CBC
HCT: 35.4 % — ABNORMAL LOW (ref 39.0–52.0)
Hemoglobin: 12 g/dL — ABNORMAL LOW (ref 13.0–17.0)
MCH: 32.6 pg (ref 26.0–34.0)
MCHC: 33.9 g/dL (ref 30.0–36.0)
MCV: 96.2 fL (ref 80.0–100.0)
Platelets: 198 K/uL (ref 150–400)
RBC: 3.68 MIL/uL — ABNORMAL LOW (ref 4.22–5.81)
RDW: 16.9 % — ABNORMAL HIGH (ref 11.5–15.5)
WBC: 6.9 K/uL (ref 4.0–10.5)
nRBC: 0 % (ref 0.0–0.2)

## 2024-09-08 LAB — RENAL FUNCTION PANEL
Albumin: 3.8 g/dL (ref 3.5–5.0)
Anion gap: 10 (ref 5–15)
BUN: 35 mg/dL — ABNORMAL HIGH (ref 6–20)
CO2: 22 mmol/L (ref 22–32)
Calcium: 8.5 mg/dL — ABNORMAL LOW (ref 8.9–10.3)
Chloride: 103 mmol/L (ref 98–111)
Creatinine, Ser: 2.71 mg/dL — ABNORMAL HIGH (ref 0.61–1.24)
GFR, Estimated: 26 mL/min — ABNORMAL LOW (ref >60)
Glucose, Bld: 96 mg/dL (ref 70–99)
Phosphorus: 2.2 mg/dL — ABNORMAL LOW (ref 2.5–4.6)
Potassium: 4.4 mmol/L (ref 3.5–5.1)
Sodium: 135 mmol/L (ref 135–145)

## 2024-09-08 SURGERY — REVISON OF ARTERIOVENOUS FISTULA
Anesthesia: General | Site: Arm Upper | Laterality: Left

## 2024-09-08 SURGERY — REVISON OF ARTERIOVENOUS FISTULA
Anesthesia: Choice | Laterality: Left

## 2024-09-08 MED ORDER — CHLORHEXIDINE GLUCONATE 0.12 % MT SOLN: 15.0000 mL | Freq: Once | OROMUCOSAL | Status: AC

## 2024-09-08 MED ORDER — MIDAZOLAM HCL (PF) 2 MG/2ML IJ SOLN
INTRAMUSCULAR | Status: DC | PRN
Start: 2024-09-08 — End: 2024-09-08
  Administered 2024-09-08: 2 mg via INTRAVENOUS

## 2024-09-08 MED ORDER — LIDOCAINE 2% (20 MG/ML) 5 ML SYRINGE
INTRAMUSCULAR | Status: DC | PRN
  Administered 2024-09-08: 60 mg via INTRAVENOUS

## 2024-09-08 MED ORDER — PHENYLEPHRINE 80 MCG/ML (10ML) SYRINGE FOR IV PUSH (FOR BLOOD PRESSURE SUPPORT)
PREFILLED_SYRINGE | INTRAVENOUS | Status: DC | PRN
Start: 2024-09-08 — End: 2024-09-08
  Administered 2024-09-08 (×2): 180 ug via INTRAVENOUS

## 2024-09-08 MED ORDER — FENTANYL CITRATE (PF) 250 MCG/5ML IJ SOLN
INTRAMUSCULAR | Status: DC | PRN
  Administered 2024-09-08: 50 ug via INTRAVENOUS

## 2024-09-08 MED ORDER — OXYCODONE HCL 5 MG PO TABS: 5.0000 mg | ORAL_TABLET | Freq: Once | ORAL | Status: DC | PRN

## 2024-09-08 MED ORDER — FUROSEMIDE 40 MG PO TABS
40.0000 mg | ORAL_TABLET | Freq: Every day | ORAL | Status: DC
  Administered 2024-09-08 – 2024-09-09 (×2): 40 mg via ORAL
  Filled 2024-09-08 (×2): qty 1

## 2024-09-08 MED ORDER — CEFAZOLIN SODIUM-DEXTROSE 1-4 GM/50ML-% IV SOLN
INTRAVENOUS | Status: DC | PRN
  Administered 2024-09-08: 2 g via INTRAVENOUS

## 2024-09-08 MED ORDER — CHLORHEXIDINE GLUCONATE 0.12 % MT SOLN
OROMUCOSAL | Status: AC
  Administered 2024-09-08: 15 mL via OROMUCOSAL
  Filled 2024-09-08: qty 15

## 2024-09-08 MED ORDER — PROPOFOL 10 MG/ML IV BOLUS
INTRAVENOUS | Status: AC
Start: 2024-09-08 — End: 2024-09-08
  Filled 2024-09-08: qty 20

## 2024-09-08 MED ORDER — HEPARIN 6000 UNIT IRRIGATION SOLUTION
Status: AC
Start: 2024-09-08 — End: 2024-09-08
  Filled 2024-09-08: qty 500

## 2024-09-08 MED ORDER — PROPOFOL 10 MG/ML IV BOLUS
INTRAVENOUS | Status: DC | PRN
  Administered 2024-09-08: 50 mg via INTRAVENOUS
  Administered 2024-09-08: 200 mg via INTRAVENOUS

## 2024-09-08 MED ORDER — DEXAMETHASONE SOD PHOSPHATE PF 10 MG/ML IJ SOLN
INTRAMUSCULAR | Status: DC | PRN
  Administered 2024-09-08: 5 mg via INTRAVENOUS

## 2024-09-08 MED ORDER — ROCURONIUM BROMIDE 10 MG/ML (PF) SYRINGE
PREFILLED_SYRINGE | INTRAVENOUS | Status: AC
  Filled 2024-09-08: qty 10

## 2024-09-08 MED ORDER — FENTANYL CITRATE (PF) 100 MCG/2ML IJ SOLN
INTRAMUSCULAR | Status: AC
  Filled 2024-09-08: qty 2

## 2024-09-08 MED ORDER — APIXABAN 5 MG PO TABS
5.0000 mg | ORAL_TABLET | Freq: Two times a day (BID) | ORAL | 1 refills | Status: DC
  Filled 2024-09-08: qty 60, 30d supply, fill #0

## 2024-09-08 MED ORDER — ONDANSETRON HCL 4 MG/2ML IJ SOLN
INTRAMUSCULAR | Status: DC | PRN
Start: 2024-09-08 — End: 2024-09-08
  Administered 2024-09-08: 4 mg via INTRAVENOUS

## 2024-09-08 MED ORDER — SODIUM CHLORIDE 0.9 % IV SOLN: INTRAVENOUS | Status: DC

## 2024-09-08 MED ORDER — PROPOFOL 10 MG/ML IV BOLUS
INTRAVENOUS | Status: AC
  Filled 2024-09-08: qty 20

## 2024-09-08 MED ORDER — 0.9 % SODIUM CHLORIDE (POUR BTL) OPTIME
TOPICAL | Status: DC | PRN
  Administered 2024-09-08: 1000 mL

## 2024-09-08 MED ORDER — HYDROCODONE-ACETAMINOPHEN 5-325 MG PO TABS
1.0000 | ORAL_TABLET | ORAL | Status: DC | PRN
Start: 2024-09-08 — End: 2024-09-09
  Administered 2024-09-08: 1 via ORAL
  Filled 2024-09-08: qty 1

## 2024-09-08 MED ORDER — LIDOCAINE 2% (20 MG/ML) 5 ML SYRINGE
INTRAMUSCULAR | Status: AC
  Filled 2024-09-08: qty 5

## 2024-09-08 MED ORDER — PHENYLEPHRINE HCL-NACL 20-0.9 MG/250ML-% IV SOLN
INTRAVENOUS | Status: DC | PRN
  Administered 2024-09-08: 45 ug/min via INTRAVENOUS

## 2024-09-08 MED ORDER — ACETAMINOPHEN 10 MG/ML IV SOLN: 1000.0000 mg | Freq: Once | INTRAVENOUS | Status: DC | PRN

## 2024-09-08 MED ORDER — VASOPRESSIN 20 UNIT/ML IV SOLN
INTRAVENOUS | Status: AC
Start: 2024-09-08 — End: 2024-09-08
  Filled 2024-09-08: qty 1

## 2024-09-08 MED ORDER — OXYCODONE HCL 5 MG/5ML PO SOLN: 5.0000 mg | Freq: Once | ORAL | Status: DC | PRN

## 2024-09-08 MED ORDER — ORAL CARE MOUTH RINSE: 15.0000 mL | Freq: Once | OROMUCOSAL | Status: AC

## 2024-09-08 MED ORDER — ONDANSETRON HCL 4 MG/2ML IJ SOLN
INTRAMUSCULAR | Status: AC
  Filled 2024-09-08: qty 2

## 2024-09-08 MED ORDER — AMISULPRIDE (ANTIEMETIC) 5 MG/2ML IV SOLN: 10.0000 mg | Freq: Once | INTRAVENOUS | Status: DC | PRN

## 2024-09-08 MED ORDER — ISOSORBIDE MONONITRATE ER 30 MG PO TB24: 15.0000 mg | ORAL_TABLET | Freq: Every day | ORAL | Status: AC

## 2024-09-08 MED ORDER — FENTANYL CITRATE (PF) 100 MCG/2ML IJ SOLN: 25.0000 ug | INTRAMUSCULAR | Status: DC | PRN

## 2024-09-08 MED ORDER — APIXABAN 5 MG PO TABS
5.0000 mg | ORAL_TABLET | Freq: Two times a day (BID) | ORAL | Status: DC
  Administered 2024-09-09: 5 mg via ORAL
  Filled 2024-09-08: qty 1

## 2024-09-08 MED ORDER — MIDAZOLAM HCL 2 MG/2ML IJ SOLN
INTRAMUSCULAR | Status: AC
  Filled 2024-09-08: qty 2

## 2024-09-08 SURGICAL SUPPLY — 35 items
ARMBAND PINK RESTRICT EXTREMIT (MISCELLANEOUS) ×1 IMPLANT
BAG COUNTER SPONGE SURGICOUNT (BAG) ×1 IMPLANT
BALLOON MUSTANG 4.0X20 75 (BALLOONS) IMPLANT
BENZOIN TINCTURE PRP APPL 2/3 (GAUZE/BANDAGES/DRESSINGS) ×1 IMPLANT
CANISTER SUCTION 3000ML PPV (SUCTIONS) ×1 IMPLANT
CANNULA VESSEL 3MM 2 BLNT TIP (CANNULA) ×1 IMPLANT
CHLORAPREP W/TINT 26 (MISCELLANEOUS) ×1 IMPLANT
CLIP LIGATING EXTRA MED SLVR (CLIP) IMPLANT
CLIP LIGATING EXTRA SM BLUE (MISCELLANEOUS) IMPLANT
COVER PROBE W GEL 5X96 (DRAPES) IMPLANT
DRAPE HALF SHEET 40X57 (DRAPES) IMPLANT
DRSG OPSITE 4X5.5 SM (GAUZE/BANDAGES/DRESSINGS) IMPLANT
ELECTRODE REM PT RTRN 9FT ADLT (ELECTROSURGICAL) ×1 IMPLANT
GAUZE SPONGE 4X4 12PLY STRL LF (GAUZE/BANDAGES/DRESSINGS) IMPLANT
GLOVE BIO SURGEON STRL SZ8 (GLOVE) ×1 IMPLANT
GOWN STRL REUS W/ TWL LRG LVL3 (GOWN DISPOSABLE) ×2 IMPLANT
GOWN STRL REUS W/ TWL XL LVL3 (GOWN DISPOSABLE) ×1 IMPLANT
INSERT FOGARTY SM (MISCELLANEOUS) IMPLANT
KIT BASIN OR (CUSTOM PROCEDURE TRAY) ×1 IMPLANT
KIT ENCORE 26 ADVANTAGE (KITS) IMPLANT
KIT TURNOVER KIT B (KITS) ×1 IMPLANT
NDL 18GX1X1/2 (RX/OR ONLY) (NEEDLE) ×1 IMPLANT
NEEDLE 18GX1X1/2 (RX/OR ONLY) (NEEDLE) ×1 IMPLANT
PACK CV ACCESS (CUSTOM PROCEDURE TRAY) ×1 IMPLANT
PAD ARMBOARD POSITIONER FOAM (MISCELLANEOUS) ×2 IMPLANT
SLING ARM FOAM STRAP LRG (SOFTGOODS) IMPLANT
SOLN 0.9% NACL POUR BTL 1000ML (IV SOLUTION) ×1 IMPLANT
SOLN STERILE WATER BTL 1000 ML (IV SOLUTION) ×1 IMPLANT
STRIP CLOSURE SKIN 1/2X4 (GAUZE/BANDAGES/DRESSINGS) ×1 IMPLANT
SUT MNCRL AB 4-0 PS2 18 (SUTURE) ×1 IMPLANT
SUT PROLENE 6 0 BV (SUTURE) ×1 IMPLANT
SUT VIC AB 3-0 SH 27X BRD (SUTURE) ×1 IMPLANT
SYR 3ML LL SCALE MARK (SYRINGE) ×1 IMPLANT
TOWEL GREEN STERILE (TOWEL DISPOSABLE) ×1 IMPLANT
UNDERPAD 30X36 HEAVY ABSORB (UNDERPADS AND DIAPERS) ×1 IMPLANT

## 2024-09-08 NOTE — Plan of Care (Signed)
  Problem: Pain Managment: Goal: General experience of comfort will improve and/or be controlled Outcome: Progressing   Problem: Cardiac: Goal: Ability to achieve and maintain adequate cardiopulmonary perfusion will improve Outcome: Progressing   Problem: Cardiovascular: Goal: Vascular access site(s) Level 0-1 will be maintained Outcome: Progressing

## 2024-09-08 NOTE — Discharge Summary (Signed)
 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 Recent renal transplant   History of ESRD status post renal transplant   HIV disease (HCC)   Arthritis, gouty   Chronic kidney disease, 4   Obstructive sleep apnea   CAD (coronary artery disease)   Atrial flutter, unspecified type [facility])   Discharge Condition: Improved  Diet recommendation: Low-sodium, heart healthy  Filed Weights   09/08/24 0429 09/08/24 0630 09/09/24 0429  Weight: 89.2 kg (P) 88.9 kg 90.6 kg    History of present illness:  58/M with chronic systolic CHF, ESRD recent renal transplant on 07/07/2024, HIV, hypertension, hypothyroidism, OSA, CAD, atrial flutter presented to the ED with ongoing dyspnea on exertion and severe orthopnea for 3 months.  Symptoms started prior to his renal transplant and have persisted since, only minimal swelling noted compliant with home antirejection medications.  In the ED vital stable, WBC 7, hemoglobin 14, troponin 40, 50 creatinine 2.4, BNP 1579, chest x-ray with mild bibasilar edema. - Admitted, cards and nephrology consulting, started on diuretics,  - Cardiology and nephrology following - RHC confirmed high output heart failure, felt to be secondary to large AV fistula, vascular surgery consulted  Hospital Course:   Acute on chronic diastolic CHF 2D echo May 2025 with EF of 50 to 55%, moderately elevated PASP, normal RV - Concern for high-output CHF from large AV fistula, RHC confirmed with significantly elevated filling pressures, CO 13.2, cardiac index 6.2,> just underwent AVF ligation today -  Diuresed well with IV Lasix , 11 L negative  - continue metoprolol , Imdur , nephrology following, switch to oral Lasix  yesterday, mild uptrend in creatinine this morning but overall euvolemic and improved, cleared for discharge home by nephrology -Recommended close follow-up in transplant clinic in 7 to 10 days -GDMT limited by CKD 4   History of ESRD status post renal transplant CKD 4 Hyperkalemia Baseline ESRD on hemodialysis status post DDKT July 07, 2024 in the [facility] system On immunosuppressant regimen including Myfortic, tacrolimus, prednisone  CMV ppx: Valcyte x 3 months (EOT 10/06/2024)  PJP ppx:Bactrim x 6 months (EOT 01/12/2025 unless CD4 <200) Appreciate nephrology input, baseline creatinine appears to be 2.5-2.8 -Renal transplant complicated by delayed graft function, on steroids - Mild uptrend in creatinine few days ago, now improving -Adding Lokelma twice a week to home meds   Atrial flutter, unspecified type (HCC) Noted prior history of atrial fibrillation and atrial flutter Was previously on Coumadin  Looks to be on hold from recent renal transplant September 2025 Was restarted on Eliquis ,  then held for vascular surgery, now resumed   CAD (coronary artery disease) Stable, continue aspirin  and Crestor    Obstructive sleep apnea CPAP   Arthritis, gouty Continue allopurinol    HIV disease (HCC) Biktarvy  continued, follow-up with infectious disease  Discharge Exam: Vitals:   09/09/24 0429 09/09/24 0718  BP: (!) 159/106 (!) 161/112  Pulse: 73 76  Resp: 18 18  Temp: 98 F (36.7 C) 97.7 F (36.5 C)  SpO2: 99% 97%   General exam: Appears calm and comfortable  HEENT: no JVD Respiratory system: Rare basilar rales Cardiovascular system: S1 & S2 heard, RRR.  Abd:  nondistended, soft and nontender.Normal bowel sounds heard. Central nervous system: Alert and oriented. No focal neurological deficits. Extremities: no edema Skin: No rashes Psychiatry:  Mood & affect  appropriate.   Discharge Instructions   Discharge Instructions     Diet - low sodium heart healthy   Complete by: As directed    Discharge wound care:   Complete by: As directed    routine   Increase activity slowly   Complete by: As directed       Allergies as of 09/09/2024       Reactions   Ace Inhibitors Cough, Other (See Comments)        Medication List     STOP taking these medications    famotidine  20 MG tablet Commonly known as: PEPCID    potassium chloride  SA 20 MEQ tablet Commonly known as: KLOR-CON  M   sevelamer  carbonate 800 MG tablet Commonly known as: RENVELA        TAKE these medications    allopurinol  100 MG tablet Commonly known as: ZYLOPRIM  TAKE ONE TABLET BY MOUTH EVERY DAY   amLODipine  2.5 MG tablet Commonly known as: NORVASC  Take 1 tablet (2.5 mg total) by mouth daily at 8 pm. What changed: when to take this   apixaban  5 MG Tabs tablet Commonly known as: ELIQUIS  Take 1 tablet (5 mg total) by mouth 2 (two) times daily.   aspirin  EC 81 MG tablet Take 81 mg by mouth daily. Swallow whole.   azelastine  0.1 % nasal spray Commonly known as: ASTELIN  place TWO SPRAY IN EACH NOSTRIL TWICE DAILY   Biktarvy  50-200-25 MG Tabs tablet Generic drug: bictegravir-emtricitabine -tenofovir  AF Take 1 tablet by mouth daily.   cetirizine  10 MG tablet Commonly known as: ZYRTEC  TAKE ONE TABLET BY MOUTH EVERY DAY AT 9.00 IN THE MORNING   ezetimibe  10 MG tablet Commonly known as: ZETIA  Take 10 mg by mouth daily.   Flovent  HFA 110 MCG/ACT inhaler Generic drug: fluticasone  Inhale 2 puffs into the lungs 2 (two) times daily as needed (for flares).   furosemide  40 MG tablet Commonly known as: LASIX  Take 40 mg by mouth daily.   gabapentin  100 MG capsule Commonly known as: NEURONTIN  Take 100 mg by mouth 3 (three) times daily.   ipratropium 0.06 % nasal spray Commonly known as: ATROVENT  instill TWO SPRAYS IN EACH NOSTRIL 2-3 times DAILY AS  NEEDED   ipratropium-albuterol  0.5-2.5 (3) MG/3ML Soln Commonly known as: DUONEB Take 3 mLs by nebulization every 6 (six) hours as needed (Shortness of breath, cough, wheeze or chest tightness).   isosorbide  mononitrate 30 MG 24 hr tablet Commonly known as: IMDUR  Take 0.5 tablets (15 mg total) by mouth at bedtime. What changed: how much to take   lactulose  10 GM/15ML solution Commonly known as: CHRONULAC  Take 45 mLs (30 g total) by mouth daily as needed. Take 15-30 ml up to twice daily for constipation   levalbuterol  45 MCG/ACT inhaler Commonly known as: XOPENEX  HFA INHALE TWO puffs into THE lungs EVERY FOUR HOURS AS NEEDED wheezing   levothyroxine  25 MCG tablet Commonly known as: SYNTHROID  TAKE ONE TABLET BY MOUTH DAILY AT EIGHT a.m. DAILY BEFORE BREAKFAST What changed: See the new instructions.   Lokelma 5 g packet Generic drug: sodium zirconium cyclosilicate Take 5 g twice a week, Mondays and Thursdays   lubiprostone  24 MCG capsule Commonly known as: AMITIZA  TAKE ONE CAPSULE BY MOUTH TWICE DAILY WITH a meal   melatonin 3 MG Tabs tablet Take 3 mg by mouth at  bedtime.   metoprolol  tartrate 25 MG tablet Commonly known as: LOPRESSOR  TAKE ONE TABLET BY MOUTH TWICE DAILY What changed: how much to take   mycophenolate 180 MG EC tablet Commonly known as: MYFORTIC Take 2 tablets by mouth in the morning, at noon, and at bedtime.   pantoprazole  40 MG tablet Commonly known as: PROTONIX  Take 1 tablet (40 mg total) by mouth daily.   Pataday  0.7 % Soln Generic drug: Olopatadine  HCl Place 1 drop into both eyes 2 (two) times daily as needed (allergies).   predniSONE  5 MG tablet Commonly known as: DELTASONE  Take 5 mg by mouth daily with breakfast.   rOPINIRole  1 MG tablet Commonly known as: REQUIP  Take 1 tablet (1 mg total) by mouth at bedtime.   rosuvastatin  10 MG tablet Commonly known as: CRESTOR  Take 1 tablet (10 mg total) by mouth daily.   Spacer/Aero-Holding  Raguel French 1 Device by Does not apply route 2 (two) times a day.   sulfamethoxazole-trimethoprim 800-160 MG tablet Commonly known as: BACTRIM DS Take 1 tablet by mouth every Monday, Wednesday, and Friday.   Symbicort  160-4.5 MCG/ACT inhaler Generic drug: budesonide -formoterol  Inhale 2 puffs into the lungs in the morning and at bedtime.   tacrolimus ER 4 MG Tb24 Commonly known as: ENVARSUS XR Take 9 mg by mouth. Take 8mg  ER tablets and 1mg  ER tablet at 0900 for total of 9mg  ER.   valGANciclovir 450 MG tablet Commonly known as: VALCYTE Take 450 mg by mouth every other day.               Discharge Care Instructions  (From admission, onward)           Start     Ordered   09/09/24 0000  Discharge wound care:       Comments: routine   09/09/24 0935           Allergies  Allergen Reactions   Ace Inhibitors Cough and Other (See Comments)    Follow-up Information     V[facility] at [facility] of [facility] Follow up.   Specialty: Vascular Surgery Why: As needed Contact information: [address] [place]  [address] [phone]        Transplant team at [facility] Schedule an appointment as soon as possible for a visit in 10 day(s).                   The results of significant diagnostics from this hospitalization (including imaging, microbiology, ancillary and laboratory) are listed below for reference.    Significant Diagnostic Studies: VAS US  DUPLEX DIALYSIS ACCESS (AVF, AVG) Result Date: 09/04/2024 DIALYSIS ACCESS Patient Name:  FRAZER [facility]  Date of Exam:   09/04/2024 Medical Rec #: [id number]        Accession #:    [id number] Date of Birth: 02-17-1966         Patient Gender: M Patient Age:   35 years Exam Location:  [facility] Procedure:      VAS US  DUPLEX DIALYSIS ACCESS (AVF, AVG) Referring Phys: DEBBY ROBERTSON  --------------------------------------------------------------------------------  Access Site: Left Upper Extremity. Access Type: Brachiobasilic AVF. History: Large left Brachiobasilic AVF with high flow rate (>2.3L/m in 2019 by          duplex at [facility]). High output heart failure by right heart cath 09/02/24. Limitations: Tortuous and aneurysmal fistula Performing Technologist: [name]  Examination Guidelines: A complete evaluation  includes B-mode imaging, spectral Doppler, color Doppler, and power Doppler as needed of all accessible portions of each vessel. Unilateral testing is considered an integral part of a complete examination. Limited examinations for reoccurring indications may be performed as noted.  Findings: +--------------------+----------+-----------------+--------+ AVF                 PSV (cm/s)Flow Vol (mL/min)Comments +--------------------+----------+-----------------+--------+ Native artery inflow   209          3443                +--------------------+----------+-----------------+--------+    Summary: Arteriovenous fistula-Aneurysmal dilatation noted. *See table(s) above for measurements and observations.  Diagnosing physician: Debby Robertson Electronically signed by Debby Robertson on 09/04/2024 at 8:42:18 PM.    --------------------------------------------------------------------------------   Final    CARDIAC CATHETERIZATION Result Date: 09/04/2024 1.  Fick cardiac output of 13.2 L/min and Fick cardiac index of 6.2 L/min/m with the following hemodynamics:  Right atrial pressure mean of 21 mmHg  Right ventricular pressure 65/19 with an end-diastolic pressure of 31 mmHg  Wedge pressure mean of 29 mmHg with V waves to 48 mmHg  PA pressure 62/31 with a mean of 44 mmHg  PVR 1.1 Woods units  PA pulsatility index of 1.5 Summary: Severely elevated filling pressures; continue aggressive diuresis.   ECHOCARDIOGRAM COMPLETE Result Date: 09/01/2024    ECHOCARDIOGRAM REPORT   Patient  Name:   Treg Diemer Leist Date of Exam: 09/01/2024 Medical Rec #:  [id number]       Height:       70.0 in Accession #:    [id number]      Weight:       205.0 lb Date of Birth:  [birth year]        BSA:          2.109 m Patient Age:    58 years        BP:           166/89 mmHg Patient Gender: M               HR:           88 bpm. Exam Location:  Inpatient Procedure: 2D Echo, Cardiac Doppler and Color Doppler (Both Spectral and Color            Flow Doppler were utilized during procedure). Indications:    Congestive Heart Failure I50.9  History:        Patient has no prior history of Echocardiogram examinations,                 most recent 03/03/2024.                  Mitral Valve: bioprosthetic valve valve is present in the mitral                 position.  Sonographer:    Jayson Gaskins Referring Phys: STEVEN J NEWTON IMPRESSIONS  1. Left ventricular ejection fraction, by estimation, is 50 to 55%. The left ventricle has low normal function. The left ventricle has no regional wall motion abnormalities. The left ventricular internal cavity size was mildly dilated. There is moderate  concentric left ventricular hypertrophy. Left ventricular diastolic parameters are indeterminate.  2. Right ventricular systolic function is normal. The right ventricular size is moderately enlarged. There is moderately elevated pulmonary artery systolic pressure. The estimated right ventricular systolic pressure is 47.3 mmHg.  3. Left atrial size was severely dilated.  4. Right atrial size  was severely dilated.  5. The mitral valve has been repaired/replaced. Mild mitral valve regurgitation. No evidence of mitral stenosis. There is a bioprosthetic valve present in the mitral position.  6. The tricuspid valve is degenerative. Tricuspid valve regurgitation is severe.  7. The aortic valve has an indeterminant number of cusps. There is mild calcification of the aortic valve. Aortic valve regurgitation is trivial. Aortic valve sclerosis/calcification is  present, without any evidence of aortic stenosis.  8. There is mild dilatation of the ascending aorta, measuring 38 mm.  9. The inferior vena cava is dilated in size with <50% respiratory variability, suggesting right atrial pressure of 15 mmHg. FINDINGS  Left Ventricle: Left ventricular ejection fraction, by estimation, is 50 to 55%. The left ventricle has low normal function. The left ventricle has no regional wall motion abnormalities. The left ventricular internal cavity size was mildly dilated. There is moderate concentric left ventricular hypertrophy. Left ventricular diastolic parameters are indeterminate. Right Ventricle: The right ventricular size is moderately enlarged. No increase in right ventricular wall thickness. Right ventricular systolic function is normal. There is moderately elevated pulmonary artery systolic pressure. The tricuspid regurgitant  velocity is 2.84 m/s, and with an assumed right atrial pressure of 15 mmHg, the estimated right ventricular systolic pressure is 47.3 mmHg. Left Atrium: Left atrial size was severely dilated. Right Atrium: Right atrial size was severely dilated. Pericardium: There is no evidence of pericardial effusion. Presence of epicardial fat layer. Mitral Valve: The mitral valve has been repaired/replaced. Mild mitral valve regurgitation. There is a bioprosthetic valve present in the mitral position. No evidence of mitral valve stenosis. MV peak gradient, 10.4 mmHg. The mean mitral valve gradient is 3.0 mmHg. Tricuspid Valve: The tricuspid valve is degenerative in appearance. Tricuspid valve regurgitation is severe. Aortic Valve: The aortic valve has an indeterminant number of cusps. There is mild calcification of the aortic valve. Aortic valve regurgitation is trivial. Aortic valve sclerosis/calcification is present, without any evidence of aortic stenosis. Aortic valve mean gradient measures 4.0 mmHg. Aortic valve peak gradient measures 4.8 mmHg. Aortic valve area,  by VTI measures 4.02 cm. Pulmonic Valve: The pulmonic valve was grossly normal. Pulmonic valve regurgitation is mild. Aorta: The aortic root is normal in size and structure. There is mild dilatation of the ascending aorta, measuring 38 mm. Venous: The inferior vena cava is dilated in size with less than 50% respiratory variability, suggesting right atrial pressure of 15 mmHg. IAS/Shunts: No atrial level shunt detected by color flow Doppler.  LEFT VENTRICLE PLAX 2D LVIDd:         5.70 cm   Diastology LVIDs:         4.25 cm   LV e' medial:    9.03 cm/s LV PW:         1.40 cm   LV E/e' medial:  16.4 LV IVS:        1.60 cm   LV e' lateral:   10.60 cm/s LVOT diam:     2.00 cm   LV E/e' lateral: 14.0 LV SV:         74 LV SV Index:   35 LVOT Area:     3.14 cm  RIGHT VENTRICLE RV S prime:     9.68 cm/s LEFT ATRIUM              Index        RIGHT ATRIUM           Index LA Vol (A2C):  94.4 ml  44.75 ml/m  RA Area:     34.20 cm LA Vol (A4C):   111.0 ml 52.62 ml/m  RA Volume:   130.00 ml 61.63 ml/m LA Biplane Vol: 105.0 ml 49.78 ml/m  AORTIC VALVE AV Area (Vmax):    3.37 cm AV Area (Vmean):   3.19 cm AV Area (VTI):     4.02 cm AV Vmax:           110.00 cm/s AV Vmean:          92.400 cm/s AV VTI:            0.185 m AV Peak Grad:      4.8 mmHg AV Mean Grad:      4.0 mmHg LVOT Vmax:         118.00 cm/s LVOT Vmean:        93.900 cm/s LVOT VTI:          0.237 m LVOT/AV VTI ratio: 1.28  AORTA Ao Root diam: 3.50 cm MITRAL VALVE                TRICUSPID VALVE MV Area (PHT): 3.37 cm     TR Peak grad:   32.3 mmHg MV Area VTI:   2.85 cm     TR Vmax:        284.00 cm/s MV Peak grad:  10.4 mmHg MV Mean grad:  3.0 mmHg     SHUNTS MV Vmax:       1.61 m/s     Systemic VTI:  0.24 m MV Vmean:      81.1 cm/s    Systemic Diam: 2.00 cm MV Decel Time: 225 msec MV E velocity: 148.00 cm/s MV A velocity: 85.90 cm/s MV E/A ratio:  1.72 Toribio Fuel MD Electronically signed by Toribio Fuel MD Signature Date/Time: 09/01/2024/11:45:33  PM    Final    DG Chest 2 View Result Date: 08/31/2024 CLINICAL DATA:  Shortness of breath EXAM: CHEST - 2 VIEW COMPARISON:  06/20/2024 FINDINGS: Frontal and lateral views of the chest demonstrate stable enlargement of the cardiac silhouette. Postsurgical changes from mitral valve repair. Nerve stimulator within the right chest, lead extending cephalad. Increased pulmonary vascular congestion with mild bibasilar interstitial and ground-glass opacities concerning for developing edema. Trace left pleural effusion. No pneumothorax. IMPRESSION: 1. Constellation of findings suggesting mild congestive heart failure and developing bibasilar edema. Electronically Signed   By: Ozell Daring M.D.   On: 08/31/2024 17:39    Microbiology: No results found for this or any previous visit (from the past 240 hours).   Labs: Basic Metabolic Panel: Recent Labs  Lab 09/04/24 0221 09/05/24 0229 09/06/24 0256 09/07/24 0306 09/08/24 0255 09/09/24 0221  NA 136 135 135 139 135 134*  K 3.4* 3.4* 4.4 4.2 4.4 5.3*  CL 100 101 102 104 103 101  CO2 22 24 24  20* 22 22  GLUCOSE 116* 104* 104* 98 96 129*  BUN 30* 31* 34* 37* 35* 43*  CREATININE 2.61* 2.89* 2.99* 3.19* 2.71* 3.10*  CALCIUM  8.7* 8.2* 8.2* 8.8* 8.5* 8.4*  PHOS 2.1*  --   --   --  2.2* 1.6*   Liver Function Tests: Recent Labs  Lab 09/08/24 0255 09/09/24 0221  ALBUMIN  3.8 3.8   No results for input(s): LIPASE, AMYLASE in the last 168 hours. No results for input(s): AMMONIA in the last 168 hours. CBC: Recent Labs  Lab 09/04/24 0221 09/05/24 0229 09/06/24 0256 09/07/24 0306 09/08/24 0255  WBC  6.1 7.2 7.3 6.9 6.9  HGB 10.8* 11.4* 11.4* 11.6* 12.0*  HCT 32.1* 34.0* 33.9* 34.7* 35.4*  MCV 95.8 95.5 95.8 95.9 96.2  PLT 192 201 217 213 198   Cardiac Enzymes: No results for input(s): CKTOTAL, CKMB, CKMBINDEX, TROPONINI in the last 168 hours. BNP: BNP (last 3 results) Recent Labs    06/08/24 0020 08/31/24 1612  BNP  1,898.3* 1,578.9*    ProBNP (last 3 results) No results for input(s): PROBNP in the last 8760 hours.  CBG: No results for input(s): GLUCAP in the last 168 hours.     Signed:  Sigurd Pac MD.  [facility] 09/09/2024, 9:35 AM

## 2024-09-08 NOTE — Progress Notes (Signed)
 [facility] NEPHROLOGY PROGRESS NOTE  Assessment/ Plan: Pt is a 58 y.o. yo male with past medical history significant for asthma, CHF, ESRD status post kidney transplant in 06/2024 at [facility], HIV, hypertension, CAD was admitted with shortness of breath. The kidney transplant was complicated by delayed graft function as well as rejection.  # Post transplant CKD IV: Baseline creatinine level seems to be around 2.5-2.8.  Status post AV fistula banding today.  Creatinine level is around his baseline.  Clinically improved and has no signs or symptoms of uremia.  We will resume furosemide  40 mg daily.  Follow-up with the transplant team at [facility].  # S/p renal transplant 07/07/2024 at [facility] complicated by delayed graft function and Acute and Chronic/Active TCMR treated with pulse steroids at the end of October.  #Progressive orthopnea and PND; LVEF ok: ? High output from AVF. -S/p right heart cath with severely elevated filling pressures and recommendation to continue aggressive diuresis .  Treated with IV diuretics initially which was held few days.  Resume oral diuretics today. -Status post AV fistula banding today.  # Immunosuppression:on Envarsus/mmf/pred at home doses.  Further management of immunosuppression at the transplant center.  # Chronic HIV - on home med.   #HTN - improved on current regimen, monitor BP.   #Secondary hyperparathyroidism: PTH 380, noted patient is on Cinacalcet  and vitamin D .  Monitor lab.  #Normocytic Anemia; hemoglobin is stable, continue to monitor.  #Hypophosphatemia: Required phosphorus repletion, encourage diet and monitor lab.  # Hypokalemia: Repleted potassium chloride .  Potassium level has improved.  Okay to discharge from renal perspective.  Please call us  with any questions.  Discussed with the patient and primary team.  Subjective: Seen and examined.  He came back from the vascular procedure.  He is walking in the room comfortable.  No nausea,  vomiting, dysgeusia, chest pain or shortness of breath.  No other major event. Objective Vital signs in last 24 hours: Vitals:   09/08/24 0915 09/08/24 0925 09/08/24 0939 09/08/24 1134  BP: (!) 150/104 (!) 150/99 (!) 148/106 (!) 132/90  Pulse: 70 67 69 65  Resp: 15 17 19 19   Temp:  97.8 F (36.6 C) 97.9 F (36.6 C) 97.7 F (36.5 C)  TempSrc:   Oral Oral  SpO2: 97% 97% 100% 99%  Weight:      Height:       Weight change: 0.6 kg  Intake/Output Summary (Last 24 hours) at 09/08/2024 1144 Last data filed at 09/08/2024 1130 Gross per 24 hour  Intake 710 ml  Output 950 ml  Net -240 ml       Labs: RENAL PANEL Recent Labs  Lab 09/01/24 1433 09/02/24 0257 09/02/24 1755 09/04/24 0221 09/05/24 0229 09/06/24 0256 09/07/24 0306 09/08/24 0255  NA  --  134*   < > 136 135 135 139 135  K  --  3.8   < > 3.4* 3.4* 4.4 4.2 4.4  CL  --  101   < > 100 101 102 104 103  CO2  --  20*   < > 22 24 24  20* 22  GLUCOSE  --  119*   < > 116* 104* 104* 98 96  BUN  --  36*   < > 30* 31* 34* 37* 35*  CREATININE  --  2.77*   < > 2.61* 2.89* 2.99* 3.19* 2.71*  CALCIUM   --  8.6*   < > 8.7* 8.2* 8.2* 8.8* 8.5*  PHOS 1.8* 1.9*  --  2.1*  --   --   --  2.2*  ALBUMIN   --  3.6  --   --   --   --   --  3.8   < > = values in this interval not displayed.    Liver Function Tests: Recent Labs  Lab 09/02/24 0257 09/08/24 0255  AST 33  --   ALT 59*  --   ALKPHOS 93  --   BILITOT 0.8  --   PROT 5.9*  --   ALBUMIN  3.6 3.8   No results for input(s): LIPASE, AMYLASE in the last 168 hours. No results for input(s): AMMONIA in the last 168 hours. CBC: Recent Labs    09/04/24 0221 09/05/24 0229 09/06/24 0256 09/07/24 0306 09/08/24 0255  HGB 10.8* 11.4* 11.4* 11.6* 12.0*  MCV 95.8 95.5 95.8 95.9 96.2    Cardiac Enzymes: No results for input(s): CKTOTAL, CKMB, CKMBINDEX, TROPONINI in the last 168 hours. CBG: No results for input(s): GLUCAP in the last 168 hours.  Iron  Studies:  No results for input(s): IRON , TIBC, TRANSFERRIN, FERRITIN in the last 72 hours. Studies/Results: No results found.   Medications: Infusions:   Scheduled Medications:  allopurinol   100 mg Oral Daily   amLODipine   5 mg Oral Daily   atorvastatin   40 mg Oral Daily   bictegravir-emtricitabine -tenofovir  AF  1 tablet Oral Daily   cholecalciferol  1,000 Units Oral Daily   cinacalcet   30 mg Oral Q supper   fluticasone  furoate-vilanterol  1 puff Inhalation Daily   isosorbide  mononitrate  15 mg Oral Daily   levothyroxine   25 mcg Oral Q0600   metoprolol  tartrate  12.5 mg Oral BID   mycophenolate  360 mg Oral TID   pantoprazole   40 mg Oral Daily   predniSONE   5 mg Oral Q breakfast   rOPINIRole   1 mg Oral QHS   sodium chloride  flush  3 mL Intravenous Q12H   sodium chloride  flush  3 mL Intravenous Q12H   sulfamethoxazole-trimethoprim  1 tablet Oral Q M,W,F   tacrolimus ER  8 mg Oral Daily   And   tacrolimus ER  1 mg Oral QAC breakfast   valGANciclovir  450 mg Oral Q48H    have reviewed scheduled and prn medications.  Physical Exam: General:NAD, comfortable Heart:RRR, s1s2 nl Lungs:clear b/l, no crackle Abdomen:soft, Non-tender, non-distended Extremities:No edema Dialysis Access: AV fistula has thrill, bandage applied, no bleeding.  Gilda Abboud Prasad Keelan Pomerleau 09/08/2024,11:44 AM  LOS: 6 days

## 2024-09-08 NOTE — Transfer of Care (Signed)
 Immediate Anesthesia Transfer of Care Note  Patient: Albert Eaton  Procedure(s) Performed: LEFT ARM BANDINGOF ARTERIOVENOUS FISTULA (Left: Arm Upper)  Patient Location: PACU  Anesthesia Type:General  Level of Consciousness: drowsy  Airway & Oxygen Therapy: Patient Spontanous Breathing  Post-op Assessment: Report given to RN and Post -op Vital signs reviewed and stable  Post vital signs: Reviewed and stable  Last Vitals:  Vitals Value Taken Time  BP 156/96 09/08/24 08:52  Temp    Pulse 70 09/08/24 08:54  Resp 15 09/08/24 08:54  SpO2 99 % 09/08/24 08:54  Vitals shown include unfiled device data.  Last Pain:  Vitals:   09/08/24 0730  TempSrc:   PainSc: 0-No pain      Patients Stated Pain Goal: 2 (09/08/24 0730)  Complications: No notable events documented.

## 2024-09-08 NOTE — Progress Notes (Signed)
 Mobility Specialist Progress Note:    09/08/24 1305  Mobility  Activity Ambulated independently  Level of Assistance Standby assist, set-up cues, supervision of patient - no hands on  Assistive Device None  Distance Ambulated (ft) 500 ft  Range of Motion/Exercises Active  Activity Response Tolerated well  Mobility Referral Yes  Mobility visit 1 Mobility  Mobility Specialist Start Time (ACUTE ONLY) 1305  Mobility Specialist Stop Time (ACUTE ONLY) 1311  Mobility Specialist Time Calculation (min) (ACUTE ONLY) 6 min   Received pt sitting EOB agreeable to session. No c/o any symptoms. Pt moving and ambulating w/ no assist. Returned pt to room w/ all needs met.  Venetia Keel Mobility Specialist Please Neurosurgeon or Rehab Office at [phone]

## 2024-09-08 NOTE — Anesthesia Preprocedure Evaluation (Addendum)
 Anesthesia Evaluation  Patient identified by MRN, date of birth, ID band Patient awake    Reviewed: Allergy & Precautions, NPO status , Patient's Chart, lab work & pertinent test results  Airway Mallampati: II  TM Distance: >3 FB Neck ROM: Full    Dental  (+) Missing   Pulmonary asthma , sleep apnea , former smoker   Pulmonary exam normal        Cardiovascular hypertension, Pt. on home beta blockers and Pt. on medications + angina  + CAD, + Past MI and +CHF  Normal cardiovascular exam+ dysrhythmias   ECHO:   1. Left ventricular ejection fraction, by estimation, is 50 to 55%. The  left ventricle has low normal function. The left ventricle has no regional  wall motion abnormalities. The left ventricular internal cavity size was  mildly dilated. There is moderate   concentric left ventricular hypertrophy. Left ventricular diastolic  parameters are indeterminate.   2. Right ventricular systolic function is normal. The right ventricular  size is moderately enlarged. There is moderately elevated pulmonary artery  systolic pressure. The estimated right ventricular systolic pressure is  47.3 mmHg.   3. Left atrial size was severely dilated.   4. Right atrial size was severely dilated.   5. The mitral valve has been repaired/replaced. Mild mitral valve  regurgitation. No evidence of mitral stenosis. There is a bioprosthetic  valve present in the mitral position.   6. The tricuspid valve is degenerative. Tricuspid valve regurgitation is  severe.   7. The aortic valve has an indeterminant number of cusps. There is mild  calcification of the aortic valve. Aortic valve regurgitation is trivial.  Aortic valve sclerosis/calcification is present, without any evidence of  aortic stenosis.   8. There is mild dilatation of the ascending aorta, measuring 38 mm.   9. The inferior vena cava is dilated in size with <50% respiratory  variability,  suggesting right atrial pressure of 15 mmHg.     Neuro/Psych  Headaches TIA   GI/Hepatic ,GERD  Medicated and Controlled,,(+) Hepatitis -  Endo/Other  Hypothyroidism    Renal/GU ESRFRenal disease     Musculoskeletal   Abdominal   Peds  Hematology  (+) Blood dyscrasia, anemia , HIV  Anesthesia Other Findings   Reproductive/Obstetrics                              Anesthesia Physical Anesthesia Plan  ASA: 3  Anesthesia Plan: General   Post-op Pain Management:    Induction: Intravenous  PONV Risk Score and Plan: 2 and Ondansetron , Dexamethasone  and Treatment may vary due to age or medical condition  Airway Management Planned: LMA  Additional Equipment:   Intra-op Plan:   Post-operative Plan: Extubation in OR  Informed Consent: I have reviewed the patients History and Physical, chart, labs and discussed the procedure including the risks, benefits and alternatives for the proposed anesthesia with the patient or authorized representative who has indicated his/her understanding and acceptance.     Dental advisory given  Plan Discussed with: CRNA  Anesthesia Plan Comments:          Anesthesia Quick Evaluation

## 2024-09-08 NOTE — TOC Transition Note (Addendum)
 Transition of Care [facility]) - Discharge Note   Patient Details  Name: Albert Eaton MRN: [id number] Date of Birth: 03-02-1966  Transition of Care [facility]) CM/SW Contact:  Waddell Barnie Rama, RN Phone Number: 09/08/2024, 4:20 PM   Clinical Narrative:    For possible dc tomorrow, will transport himself home,  has Medicaid , copays for meds is 4.00.          Patient Goals and CMS Choice            Discharge Placement                       Discharge Plan and Services Additional resources added to the After Visit Summary for                                       Social Drivers of Health (SDOH) Interventions SDOH Screenings   Food Insecurity: No Food Insecurity (09/01/2024)  Housing: Low Risk  (09/01/2024)  Transportation Needs: No Transportation Needs (09/01/2024)  Utilities: At Risk (09/01/2024)  Alcohol Screen: Low Risk  (07/13/2023)  Depression (PHQ2-9): Medium Risk (06/01/2024)  Financial Resource Strain: High Risk (07/13/2023)  Physical Activity: Inactive (07/13/2023)  Social Connections: Socially Isolated (07/13/2023)  Stress: No Stress Concern Present (07/13/2023)  Tobacco Use: Medium Risk (09/08/2024)  Health Literacy: Adequate Health Literacy (07/13/2023)     Readmission Risk Interventions    03/03/2024    3:46 PM 09/10/2022    2:51 PM 08/22/2022   11:54 AM  Readmission Risk Prevention Plan  Transportation Screening Complete Complete Complete  Medication Review (RN Care Manager) Referral to Pharmacy Complete Complete  PCP or Specialist appointment within 3-5 days of discharge Complete    HRI or Home Care Consult Complete Complete Complete  SW Recovery Care/Counseling Consult Complete Complete Complete  Palliative Care Screening Not Applicable Not Applicable Not Applicable  Skilled Nursing Facility Not Applicable Complete Not Applicable

## 2024-09-08 NOTE — Progress Notes (Signed)
 Patient received from cathlab, V/S stable, all needs met, call bell in reach.   09/08/24 0939  Vitals  Temp 97.9 F (36.6 C)  Temp Source Oral  BP (!) 148/106  MAP (mmHg) 120  BP Location Right Arm  BP Method Automatic  Patient Position (if appropriate) Lying  Pulse Rate 69  Pulse Rate Source Monitor  Resp 19  Level of Consciousness  Level of Consciousness Alert  MEWS COLOR  MEWS Score Color Green  Oxygen Therapy  SpO2 100 %  O2 Device Room Air  Pain Assessment  Pain Scale 0-10  Pain Score 6  Pain Location Arm  Pain Orientation Left  Pain Descriptors / Indicators Sharp  Pain Frequency Constant  Pain Onset On-going  Patients Stated Pain Goal 0  Pain Intervention(s) Medication (See eMAR)  MEWS Score  MEWS Temp 0  MEWS Systolic 0  MEWS Pulse 0  MEWS RR 0  MEWS LOC 0  MEWS Score 0

## 2024-09-08 NOTE — Progress Notes (Addendum)
 PROGRESS NOTE    Albert Eaton  FMW:[id number] DOB: [birth year] DOA: 08/31/2024 PCP: [name], NP  58/M with chronic systolic CHF, ESRD recent renal transplant on 07/07/2024, HIV, hypertension, hypothyroidism, OSA, CAD, atrial flutter presented to the ED with ongoing dyspnea on exertion and severe orthopnea for 3 months.  Symptoms started prior to his renal transplant and have persisted since, only minimal swelling noted compliant with home antirejection medications.  In the ED vital stable, WBC 7, hemoglobin 14, troponin 40, 50 creatinine 2.4, BNP 1579, chest x-ray with mild bibasilar edema. - Admitted, cards and nephrology consulting, started on diuretics,  - Cardiology and nephrology following - RHC confirmed high output heart failure, felt to be secondary to large AV fistula, vascular surgery consulted   Subjective: - Breathing improving but continues to have orthopnea  Assessment and Plan:  Acute on chronic diastolic CHF 2D echo May 2025 with EF of 50 to 55%, moderately elevated PASP, normal RV - Concern for high-output CHF from large AV fistula, RHC confirmed with significantly elevated filling pressures, CO 13.2, cardiac index 6.2,> just underwent AVF ligation today - Diuresed well with IV Lasix , 11 L negative  -Diuretics on hold now, continue metoprolol , Imdur ,?  Still reports orthopnea, resume diuretics today await nephrology input -GDMT limited by CKD 4  History of ESRD status post renal transplant CKD 4 Baseline ESRD on hemodialysis status post DDKT July 07, 2024 in the [facility] On immunosuppressant regimen including Myfortic, tacrolimus, prednisone  CMV ppx: Valcyte x 3 months (EOT 10/06/2024)  PJP ppx:Bactrim x 6 months (EOT 01/12/2025 unless CD4 <200) Appreciate nephrology input, baseline creatinine appears to be 2.5-2.8 -Renal transplant complicated by delayed graft function, on steroids - Mild uptrend in creatinine few days ago, now improving  Atrial  flutter, unspecified type (HCC) Noted prior history of atrial fibrillation and atrial flutter Was previously on Coumadin  Looks to be on hold from recent renal transplant September 2025 Was restarted on Eliquis , now held for vascular surgery  CAD (coronary artery disease) Stable, continue aspirin  and Crestor   Obstructive sleep apnea CPAP  Arthritis, gouty Continue allopurinol   HIV disease (HCC) Biktarvy  continued, follow-up with infectious disease    DVT prophylaxis: SCDs Code Status: Full code Family Communication: None present Disposition Plan: Home pending improvement in volume status  Consultants:    Procedures:   Antimicrobials:    Objective: Vitals:   09/08/24 0900 09/08/24 0915 09/08/24 0925 09/08/24 0939  BP: (!) 134/90 (!) 150/104 (!) 150/99 (!) 148/106  Pulse: 65 70 67 69  Resp: 16 15 17 19   Temp:   97.8 F (36.6 C) 97.9 F (36.6 C)  TempSrc:    Oral  SpO2: 97% 97% 97% 100%  Weight:      Height:        Intake/Output Summary (Last 24 hours) at 09/08/2024 1020 Last data filed at 09/08/2024 0846 Gross per 24 hour  Intake 710 ml  Output 750 ml  Net -40 ml   Filed Weights   09/07/24 0509 09/08/24 0429 09/08/24 0630  Weight: 88.6 kg 89.2 kg (P) 88.9 kg    Examination:  General exam: Appears calm and comfortable  HEENT: Positive JVD Respiratory system: Rare basilar rales Cardiovascular system: S1 & S2 heard, RRR.  Abd: nondistended, soft and nontender.Normal bowel sounds heard. Central nervous system: Alert and oriented. No focal neurological deficits. Extremities: no edema Skin: No rashes Psychiatry:  Mood & affect appropriate.     Data Reviewed:   CBC: Recent Labs  Lab 09/04/24 0221 09/05/24 0229 09/06/24 0256 09/07/24 0306 09/08/24 0255  WBC 6.1 7.2 7.3 6.9 6.9  HGB 10.8* 11.4* 11.4* 11.6* 12.0*  HCT 32.1* 34.0* 33.9* 34.7* 35.4*  MCV 95.8 95.5 95.8 95.9 96.2  PLT 192 201 217 213 198   Basic Metabolic Panel: Recent Labs   Lab 09/01/24 1433 09/02/24 0257 09/02/24 1755 09/04/24 0221 09/05/24 0229 09/06/24 0256 09/07/24 0306 09/08/24 0255  NA  --  134*   < > 136 135 135 139 135  K  --  3.8   < > 3.4* 3.4* 4.4 4.2 4.4  CL  --  101   < > 100 101 102 104 103  CO2  --  20*   < > 22 24 24  20* 22  GLUCOSE  --  119*   < > 116* 104* 104* 98 96  BUN  --  36*   < > 30* 31* 34* 37* 35*  CREATININE  --  2.77*   < > 2.61* 2.89* 2.99* 3.19* 2.71*  CALCIUM   --  8.6*   < > 8.7* 8.2* 8.2* 8.8* 8.5*  PHOS 1.8* 1.9*  --  2.1*  --   --   --  2.2*   < > = values in this interval not displayed.   GFR: Estimated Creatinine Clearance: 33.4 mL/min (A) (by C-G formula based on SCr of 2.71 mg/dL (H)). Liver Function Tests: Recent Labs  Lab 09/02/24 0257 09/08/24 0255  AST 33  --   ALT 59*  --   ALKPHOS 93  --   BILITOT 0.8  --   PROT 5.9*  --   ALBUMIN  3.6 3.8   No results for input(s): LIPASE, AMYLASE in the last 168 hours. No results for input(s): AMMONIA in the last 168 hours. Coagulation Profile: No results for input(s): INR, PROTIME in the last 168 hours. Cardiac Enzymes: No results for input(s): CKTOTAL, CKMB, CKMBINDEX, TROPONINI in the last 168 hours. BNP (last 3 results) No results for input(s): PROBNP in the last 8760 hours. HbA1C: No results for input(s): HGBA1C in the last 72 hours. CBG: No results for input(s): GLUCAP in the last 168 hours. Lipid Profile: No results for input(s): CHOL, HDL, LDLCALC, TRIG, CHOLHDL, LDLDIRECT in the last 72 hours. Thyroid  Function Tests: No results for input(s): TSH, T4TOTAL, FREET4, T3FREE, THYROIDAB in the last 72 hours. Anemia Panel: No results for input(s): VITAMINB12, FOLATE, FERRITIN, TIBC, IRON , RETICCTPCT in the last 72 hours. Urine analysis:    Component Value Date/Time   COLORURINE STRAW (A) 08/31/2024 1613   APPEARANCEUR CLEAR 08/31/2024 1613   LABSPEC 1.006 08/31/2024 1613   PHURINE 5.0  08/31/2024 1613   GLUCOSEU NEGATIVE 08/31/2024 1613   HGBUR NEGATIVE 08/31/2024 1613   BILIRUBINUR NEGATIVE 08/31/2024 1613   BILIRUBINUR neg 12/27/2019 0913   KETONESUR NEGATIVE 08/31/2024 1613   PROTEINUR NEGATIVE 08/31/2024 1613   UROBILINOGEN 0.2 12/27/2019 0913   UROBILINOGEN 0.2 01/15/2018 1140   NITRITE NEGATIVE 08/31/2024 1613   LEUKOCYTESUR NEGATIVE 08/31/2024 1613   Sepsis Labs: @LABRCNTIP (procalcitonin:4,lacticidven:4)  )No results found for this or any previous visit (from the past 240 hours).   Radiology Studies: No results found.    Scheduled Meds:  allopurinol   100 mg Oral Daily   amLODipine   5 mg Oral Daily   atorvastatin   40 mg Oral Daily   bictegravir-emtricitabine -tenofovir  AF  1 tablet Oral Daily   cholecalciferol  1,000 Units Oral Daily   cinacalcet   30 mg Oral Q supper  fluticasone  furoate-vilanterol  1 puff Inhalation Daily   isosorbide  mononitrate  15 mg Oral Daily   levothyroxine   25 mcg Oral Q0600   metoprolol  tartrate  12.5 mg Oral BID   mycophenolate  360 mg Oral TID   pantoprazole   40 mg Oral Daily   predniSONE   5 mg Oral Q breakfast   rOPINIRole   1 mg Oral QHS   sodium chloride  flush  3 mL Intravenous Q12H   sodium chloride  flush  3 mL Intravenous Q12H   sulfamethoxazole-trimethoprim  1 tablet Oral Q M,W,F   tacrolimus ER  8 mg Oral Daily   And   tacrolimus ER  1 mg Oral QAC breakfast   valGANciclovir  450 mg Oral Q48H   Continuous Infusions:     LOS: 6 days    Time spent:    Sigurd Pac, MD [facility]   09/08/2024, 10:20 AM

## 2024-09-08 NOTE — Plan of Care (Signed)

## 2024-09-08 NOTE — Progress Notes (Signed)
 [facility] [place] PROGRESS NOTE  ASSESSMENT / PLAN: Albert Eaton is a 58 y.o. male with: 1) large left brachiobasilic AVF with high flow rate (>3L/min by duplex) 2) CKD IV after kidney transplantation 07/23/24 at [facility] 2/2 rejection and delayed graft function 3) High output heart failure by right heart cath 09/02/24   Plan AVF banding today in OR  SUBJECTIVE: Reviewed OR plan. Patient amenable.  OBJECTIVE: BP (!) (P) 141/81   Pulse (P) 72   Temp (P) 98.4 F (36.9 C) (Oral)   Resp (P) 17   Ht (P) 5' 10 (1.778 m)   Wt (P) 88.9 kg   SpO2 (P) 99%   BMI (P) 28.12 kg/m   Intake/Output Summary (Last 24 hours) at 09/08/2024 0713 Last data filed at 09/08/2024 0432 Gross per 24 hour  Intake 360 ml  Output 750 ml  Net -390 ml    No distress Regular rate and rhythm LUE AVF with strong thrill     Latest Ref Rng & Units 09/08/2024    2:55 AM 09/07/2024    3:06 AM 09/06/2024    2:56 AM  CBC  WBC 4.0 - 10.5 K/uL 6.9  6.9  7.3   Hemoglobin 13.0 - 17.0 g/dL 87.9  88.3  88.5   Hematocrit 39.0 - 52.0 % 35.4  34.7  33.9   Platelets 150 - 400 K/uL 198  213  217         Latest Ref Rng & Units 09/08/2024    2:55 AM 09/07/2024    3:06 AM 09/06/2024    2:56 AM  CMP  Glucose 70 - 99 mg/dL 96  98  895   BUN 6 - 20 mg/dL 35  37  34   Creatinine 0.61 - 1.24 mg/dL 7.28  6.80  7.00   Sodium 135 - 145 mmol/L 135  139  135   Potassium 3.5 - 5.1 mmol/L 4.4  4.2  4.4   Chloride 98 - 111 mmol/L 103  104  102   CO2 22 - 32 mmol/L 22  20  24    Calcium  8.9 - 10.3 mg/dL 8.5  8.8  8.2     Estimated Creatinine Clearance: 33.4 mL/min (A) (by C-G formula based on SCr of 2.71 mg/dL (H)).  Debby SAILOR. Magda, MD [facility] Phone Number: [phone] 09/08/2024 7:13 AM

## 2024-09-08 NOTE — Op Note (Signed)
 DATE OF SERVICE: 09/08/2024  PATIENT:  Albert Eaton  58 y.o. male  PRE-OPERATIVE DIAGNOSIS:  ESRD, high output heart failure, recent kidney transplant with rejection  POST-OPERATIVE DIAGNOSIS:  Same  PROCEDURE:   Revision of left arm AV fistula with banding to 4mm  SURGEON:  Surgeons and Role:    * Magda Debby SAILOR, MD - Primary  ASSISTANT: Lytle Pizza, PA-S  An experienced assistant was required given the complexity of this procedure and the standard of surgical care. My assistant helped with exposure through counter tension, suctioning, ligation and retraction to better visualize the surgical field.  My assistant expedited sewing during the case by following my sutures. Wherever I use the term we in the report, my assistant actively helped me with that portion of the procedure.  ANESTHESIA:   general  EBL: min  BLOOD ADMINISTERED:none  DRAINS: none   LOCAL MEDICATIONS USED:  NONE  SPECIMEN:  none  COUNTS: confirmed correct.  TOURNIQUET:  none  PATIENT DISPOSITION:  PACU - hemodynamically stable.   Delay start of Pharmacological VTE agent (>24hrs) due to surgical blood loss or risk of bleeding: no  INDICATION FOR PROCEDURE: [name] is a 58 y.o. male with ESRD on HD via LUE BB AVF. He also has high output heart failure and recent kidney transplant with some rejection. After careful discussion of risks, benefits, and alternatives the patient was offered banding of the fistula. The patient understood and wished to proceed.  OPERATIVE FINDINGS: banded fistula with 3x 0 silk suture tied down to 4mm angioplasty balloon. Radial doppler flow at completion. Fistula had thrill at completion  DESCRIPTION OF PROCEDURE: After identification of the patient in the pre-operative holding area, the patient was transferred to the operating room. The patient was positioned supine on the operating room table. Anesthesia was induced. The left arm was prepped and draped in standard  fashion. A surgical pause was performed confirming correct patient, procedure, and operative location.  Previous scar was reopened and the upper arm near the antecubital crease over the course of the fistula.  Incision was carried down to subcutaneous tissue with Bovie electrocautery.  The fistula was identified and encircled with a right angle clamp.  3-0 silk sutures were placed around the fistula.  A 4 mm angioplasty balloon was placed near the area of proposed banding and inflated to 4 mm.  The silk sutures were then tied down to the balloon.  The balloon was removed and the fistula evaluated.  Thrill was preserved through the fistula.  Doppler flow was confirmed in the radial artery.  Doppler flow was confirmed in the proximal and distal fistula to the banding.  Satisfied we ended the case here.  Wound was closed in layers using 3-0 Vicryl and 4-0 Monocryl.  Benzoin and Steri-Strips were applied.  Upon completion of the case instrument and sharps counts were confirmed correct. The patient was transferred to the PACU in good condition. I was present for all portions of the procedure.  FOLLOW UP PLAN: Okay to use fistula.  Follow-up as needed for wound problems or fistula concerns.  Debby SAILOR. Magda, MD [facility] Phone Number: [phone] 09/08/2024 8:50 AM

## 2024-09-08 NOTE — Anesthesia Procedure Notes (Signed)
 Procedure Name: LMA Insertion Date/Time: 09/08/2024 7:55 AM  Performed by: Mannie Krystal LABOR, CRNAPre-anesthesia Checklist: Patient identified, Emergency Drugs available, Suction available and Patient being monitored Patient Re-evaluated:Patient Re-evaluated prior to induction Oxygen Delivery Method: Circle system utilized Preoxygenation: Pre-oxygenation with 100% oxygen Induction Type: IV induction Ventilation: Mask ventilation without difficulty LMA: LMA inserted Number of attempts: 1 Placement Confirmation: positive ETCO2 and breath sounds checked- equal and bilateral Tube secured with: Tape Dental Injury: Teeth and Oropharynx as per pre-operative assessment

## 2024-09-09 ENCOUNTER — Encounter (HOSPITAL_COMMUNITY): Payer: Self-pay | Admitting: Vascular Surgery

## 2024-09-09 ENCOUNTER — Other Ambulatory Visit (HOSPITAL_COMMUNITY): Payer: Self-pay

## 2024-09-09 LAB — RENAL FUNCTION PANEL
Albumin: 3.8 g/dL (ref 3.5–5.0)
Anion gap: 11 (ref 5–15)
BUN: 43 mg/dL — ABNORMAL HIGH (ref 6–20)
CO2: 22 mmol/L (ref 22–32)
Calcium: 8.4 mg/dL — ABNORMAL LOW (ref 8.9–10.3)
Chloride: 101 mmol/L (ref 98–111)
Creatinine, Ser: 3.1 mg/dL — ABNORMAL HIGH (ref 0.61–1.24)
GFR, Estimated: 22 mL/min — ABNORMAL LOW (ref >60)
Glucose, Bld: 129 mg/dL — ABNORMAL HIGH (ref 70–99)
Phosphorus: 1.6 mg/dL — ABNORMAL LOW (ref 2.5–4.6)
Potassium: 5.3 mmol/L — ABNORMAL HIGH (ref 3.5–5.1)
Sodium: 134 mmol/L — ABNORMAL LOW (ref 135–145)

## 2024-09-09 MED ORDER — LOKELMA 5 G PO PACK
PACK | ORAL | 0 refills | Status: AC
  Filled 2024-09-09: qty 6, 28d supply, fill #0

## 2024-09-09 MED ORDER — SODIUM ZIRCONIUM CYCLOSILICATE 10 G PO PACK
10.0000 g | PACK | Freq: Once | ORAL | Status: AC
  Administered 2024-09-09: 10 g via ORAL
  Filled 2024-09-09: qty 1

## 2024-09-09 NOTE — Anesthesia Postprocedure Evaluation (Signed)
 Anesthesia Post Note  Patient: Albert Eaton  Procedure(s) Performed: LEFT ARM BANDINGOF ARTERIOVENOUS FISTULA (Left: Arm Upper)     Patient location during evaluation: PACU Anesthesia Type: General Level of consciousness: awake Pain management: pain level controlled Vital Signs Assessment: post-procedure vital signs reviewed and stable Respiratory status: spontaneous breathing, nonlabored ventilation and respiratory function stable Cardiovascular status: blood pressure returned to baseline and stable Postop Assessment: no apparent nausea or vomiting Anesthetic complications: no   No notable events documented.  Last Vitals:  Vitals:   09/09/24 0029 09/09/24 0429  BP: (!) 137/95 (!) 159/106  Pulse: 69 73  Resp: 16 18  Temp: 36.7 C 36.7 C  SpO2: 99% 99%    Last Pain:  Vitals:   09/09/24 0429  TempSrc: Oral  PainSc:                  Agustine Rossitto P Darryle Dennie

## 2024-09-09 NOTE — TOC Transition Note (Signed)
 Transition of Care [facility]) - Discharge Note   Patient Details  Name: Albert Eaton MRN: [id number] Date of Birth: Jul 31, 1966  Transition of Care [facility]) CM/SW Contact:  Waddell Barnie Rama, RN Phone Number: 09/09/2024, 10:01 AM   Clinical Narrative:    For dc today, has no needs. He will transport himself home.         Patient Goals and CMS Choice            Discharge Placement                       Discharge Plan and Services Additional resources added to the After Visit Summary for                                       Social Drivers of Health (SDOH) Interventions SDOH Screenings   Food Insecurity: No Food Insecurity (09/01/2024)  Housing: Low Risk  (09/01/2024)  Transportation Needs: No Transportation Needs (09/01/2024)  Utilities: At Risk (09/01/2024)  Alcohol Screen: Low Risk  (07/13/2023)  Depression (PHQ2-9): Medium Risk (06/01/2024)  Financial Resource Strain: High Risk (07/13/2023)  Physical Activity: Inactive (07/13/2023)  Social Connections: Socially Isolated (07/13/2023)  Stress: No Stress Concern Present (07/13/2023)  Tobacco Use: Medium Risk (09/08/2024)  Health Literacy: Adequate Health Literacy (07/13/2023)     Readmission Risk Interventions    03/03/2024    3:46 PM 09/10/2022    2:51 PM 08/22/2022   11:54 AM  Readmission Risk Prevention Plan  Transportation Screening Complete Complete Complete  Medication Review (RN Care Manager) Referral to Pharmacy Complete Complete  PCP or Specialist appointment within 3-5 days of discharge Complete    HRI or Home Care Consult Complete Complete Complete  SW Recovery Care/Counseling Consult Complete Complete Complete  Palliative Care Screening Not Applicable Not Applicable Not Applicable  Skilled Nursing Facility Not Applicable Complete Not Applicable

## 2024-09-09 NOTE — Plan of Care (Signed)
   Problem: Education: Goal: Knowledge of General Education information will improve Description Including pain rating scale, medication(s)/side effects and non-pharmacologic comfort measures Outcome: Progressing   Problem: Health Behavior/Discharge Planning: Goal: Ability to manage health-related needs will improve Outcome: Progressing

## 2024-09-12 ENCOUNTER — Telehealth: Payer: Self-pay

## 2024-09-12 NOTE — Transitions of Care (Post Inpatient/ED Visit) (Signed)
   09/12/2024  Name: Albert Eaton MRN: [id number] DOB: May 10, 1966  Today's TOC FU Call Status: Today's TOC FU Call Status:: Unsuccessful Call (1st Attempt) Unsuccessful Call (1st Attempt) Date: 09/12/24  Attempted to reach the patient regarding the most recent Inpatient/ED visit.  Follow Up Plan: Additional outreach attempts will be made to reach the patient to complete the Transitions of Care (Post Inpatient/ED visit) call.   Arvin Seip RN, BSN, CCM [employer], Population Health Case Manager Phone: [phone]

## 2024-09-13 ENCOUNTER — Telehealth: Payer: Self-pay

## 2024-09-13 ENCOUNTER — Other Ambulatory Visit: Payer: Self-pay | Admitting: Allergy & Immunology

## 2024-09-13 NOTE — Transitions of Care (Post Inpatient/ED Visit) (Signed)
   09/13/2024  Name: Albert Eaton MRN: [id number] DOB: 1966/03/21  Today's TOC FU Call Status: Today's TOC FU Call Status:: Unsuccessful Call (2nd Attempt) Unsuccessful Call (2nd Attempt) Date: 09/13/24  Attempted to reach the patient regarding the most recent Inpatient/ED visit.  Follow Up Plan: Additional outreach attempts will be made to reach the patient to complete the Transitions of Care (Post Inpatient/ED visit) call.   Arvin Seip RN, BSN, CCM [employer], Population Health Case Manager Phone: [phone]

## 2024-09-14 ENCOUNTER — Telehealth: Payer: Self-pay

## 2024-09-14 NOTE — Patient Instructions (Signed)
 Visit Information  Thank you for taking time to visit with me today. Please don't hesitate to contact me if I can be of assistance to you   Patient instructions:  Report to your doctor weight gain of 2 -3 pounds in a day or 5 pounds in a week Please weight daily or as ordered by your doctor Limit salt intake Monitor for shortness of breath, swelling of feet, ankles or abdomen and weight gain. Never use the saltshaker. Read all food labels and avoid canned, processed, and pickled foods. Follow your doctor's recommendations for daily salt intake  Patient verbalizes understanding of instructions and care plan provided today and agrees to view in MyChart. Active MyChart status and patient understanding of how to access instructions and care plan via MyChart confirmed with patient.     The patient has been provided with contact information for the care management team and has been advised to call with any health related questions or concerns.   Please call the care guide team at [phone] if you need to cancel or reschedule your appointment.   Please call the Suicide and Crisis Lifeline: 988 call the [place]  National Suicide Prevention Lifeline: 681 115 9148 or TTY: (979)687-3659 TTY (260) 816-3143) to talk to a trained counselor call 1-800-273-TALK (toll free, 24 hour hotline) if you are experiencing a Mental Health or Behavioral Health Crisis or need someone to talk to.  Arvin Seip RN, BSN, CCM [employer], Population Health Case Manager Phone: [phone]

## 2024-09-14 NOTE — Transitions of Care (Post Inpatient/ED Visit) (Signed)
 09/14/2024  Name: Albert Eaton MRN: [id number] DOB: September 19, 1966  Today's TOC FU Call Status: Today's TOC FU Call Status:: Successful TOC FU Call Completed TOC FU Call Complete Date: 09/14/24  Patient's Name and Date of Birth confirmed. Name, DOB  Transition Care Management Follow-up Telephone Call Date of Discharge: 09/09/24 Discharge Facility: [facility]) Type of Discharge: Inpatient Admission Primary Inpatient Discharge Diagnosis:: acute on chronic diastolic CHF How have you been since you were released from the hospital?: Better Any questions or concerns?: No  Items Reviewed: Did you receive and understand the discharge instructions provided?: Yes Medications obtained,verified, and reconciled?: Yes (Medications Reviewed) Any new allergies since your discharge?: No Dietary orders reviewed?: Yes Type of Diet Ordered:: low salt heart healthy/ renal diet Do you have support at home?: Yes People in Home [RPT]: other relative(s) Name of Support/Comfort Primary Source: Ozell Fireman  Medications Reviewed Today: Medications Reviewed Today     Reviewed by Cloie Wooden E, RN (Registered Nurse) on 09/14/24 at 1244  Med List Status: <None>   Medication Order Taking? Sig Documenting Provider Last Dose Status Informant  acetaminophen  (TYLENOL ) 500 MG tablet [id number] Yes Take 500 mg by mouth every 6 (six) hours as needed. [provider]  Active   allopurinol  (ZYLOPRIM ) 100 MG tablet [id number] Yes TAKE ONE TABLET BY MOUTH EVERY DAY [name], NP  Active Self, Pharmacy Records  amLODipine  (NORVASC ) 2.5 MG tablet [id number]  Take 1 tablet (2.5 mg total) by mouth daily at 8 pm.  Patient not taking: Reported on 09/14/2024   Wonda Ozell, MD  Active Self, Pharmacy Records  apixaban  (ELIQUIS ) 5 MG TABS tablet [id number]  Take 1 tablet (5 mg total) by mouth 2 (two) times daily.  Patient not taking: Reported on 09/14/2024   Fairy Frames, MD  Active   aspirin  EC 81 MG  tablet [id number] Yes Take 81 mg by mouth daily. Swallow whole. [provider]  Active Self, Pharmacy Records  azelastine  (ASTELIN ) 0.1 % nasal spray [id number]  place TWO SPRAY IN EACH NOSTRIL TWICE DAILY  Patient not taking: Reported on 09/14/2024   Iva Marty Saltness, MD  Active Self, Pharmacy Records  bictegravir-emtricitabine -tenofovir  AF (BIKTARVY ) 50-200-25 MG TABS tablet [id number] Yes Take 1 tablet by mouth daily. Dennise Kingsley, MD  Active Self, Pharmacy Records  cetirizine  (ZYRTEC ) 10 MG tablet [id number] Yes TAKE ONE TABLET BY MOUTH EVERY DAY AT 9.00 IN THE MORNING Iva Marty Saltness, MD  Active Self, Pharmacy Records  ezetimibe  (ZETIA ) 10 MG tablet [id number]  Take 10 mg by mouth daily.  Patient not taking: No sig reported   [provider]  Active Self, Pharmacy Records  fluticasone  (FLOVENT  HFA) 110 MCG/ACT inhaler [id number]  Inhale 2 puffs into the lungs 2 (two) times daily as needed (for flares). [provider]  Active Self, Pharmacy Records  furosemide  (LASIX ) 40 MG tablet [id number] Yes Take 40 mg by mouth daily. [provider]  Active Self, Pharmacy Records  gabapentin  (NEURONTIN ) 100 MG capsule [id number] Yes Take 100 mg by mouth 3 (three) times daily. [provider]  Active Self, Pharmacy Records  ipratropium (ATROVENT ) 0.06 % nasal spray [id number] Yes instill TWO SPRAYS IN EACH NOSTRIL 2-3 times DAILY AS NEEDED Iva Marty Saltness, MD  Active Self, Pharmacy Records  ipratropium-albuterol  (DUONEB) 0.5-2.5 (3) MG/3ML LARRAINE [id number] Yes Take 3 mLs by nebulization every 6 (six) hours as needed (Shortness of breath, cough, wheeze or chest tightness). Jeneal Danita Macintosh, MD  Active Self, Pharmacy  Records  isosorbide  mononitrate (IMDUR ) 30 MG 24 hr tablet [id number] Yes Take 0.5 tablets (15 mg total) by mouth at bedtime.  Patient taking differently: Take 30 mg by mouth at bedtime. Taking 1 tablet by mouth daily   Fairy Frames, MD  Active   lactulose  (CHRONULAC ) 10 GM/15ML solution [id number]  Take 45 mLs (30 g total) by mouth daily as needed. Take 15-30 ml up to twice daily for constipation  Patient not taking: Reported on 09/01/2024   Albertus Gordy HERO, MD  Active Self, Pharmacy Records  levalbuterol  (XOPENEX  HFA) 45 MCG/ACT inhaler [id number] Yes INHALE TWO puffs into THE lungs EVERY FOUR HOURS AS NEEDED wheezing Iva Marty Saltness, MD  Active Self, Pharmacy Records  levothyroxine  (SYNTHROID ) 25 MCG tablet [id number] Yes TAKE ONE TABLET BY MOUTH DAILY AT EIGHT a.m. DAILY BEFORE BREAKFAST [name], NP  Active Self, Pharmacy Records  lubiprostone  (AMITIZA ) 24 MCG capsule [id number] Yes TAKE ONE CAPSULE BY MOUTH TWICE DAILY WITH a meal  Patient taking differently: 24 mcg. T capsule by mouth daily as needed for constipation   Pyrtle, Gordy HERO, MD  Active Self, Pharmacy Records           Med Note [facility], DONETA GORMAN Schaumann Sep 01, 2024  4:27 AM) Waiting on a refill from provider  melatonin 3 MG TABS tablet [id number] Yes Take 3 mg by mouth at bedtime. [provider]  Active Self, Pharmacy Records  methocarbamol  (ROBAXIN ) 500 MG tablet [id number] Yes Take 500 mg by mouth 3 (three) times daily as needed for muscle spasms. [provider]  Active   metoprolol  tartrate (LOPRESSOR ) 25 MG tablet [id number] Yes TAKE ONE TABLET BY MOUTH TWICE DAILY  Patient taking differently: Take 12.5 mg by mouth 2 (two) times daily.   Wonda Sharper, MD  Active Self, Pharmacy Records  mycophenolate (MYFORTIC) 180 MG EC tablet [id number] Yes Take 2 tablets by mouth in the morning, at noon, and at bedtime. [provider]  Active Self, Pharmacy Records  Olopatadine  HCl (PATADAY ) 0.7 % SOLN [id number]  Place 1 drop into both eyes 2 (two) times daily as needed (allergies).  Patient not taking: Reported on 09/14/2024   Iva Marty Saltness, MD  Active Self, Pharmacy Records  pantoprazole  (PROTONIX ) 40 MG tablet [id number]  Yes Take 1 tablet (40 mg total) by mouth daily. Iva Marty Saltness, MD  Active Self, Pharmacy Records  predniSONE  (DELTASONE ) 5 MG tablet [id number] Yes Take 5 mg by mouth daily with breakfast. [provider]  Active Self, Pharmacy Records  rOPINIRole  (REQUIP ) 1 MG tablet [id number] Yes Take 1 tablet (1 mg total) by mouth at bedtime. Neda Jennet LABOR, MD  Active Self, Pharmacy Records  rosuvastatin  (CRESTOR ) 10 MG tablet [id number] Yes Take 1 tablet (10 mg total) by mouth daily. Wonda Sharper, MD  Active Self, Pharmacy Records  sodium zirconium cyclosilicate [facility]) 5 g packet [id number]  Take 5 g twice a week, Mondays and Thursdays Fairy Frames, MD  Active   Spacer/Aero-Holding Chambers DEVI [id number]  1 Device by Does not apply route 2 (two) times a day.  Patient not taking: Reported on 09/01/2024   Bobbitt, Elgin Pepper, MD  Active Self, Pharmacy Records  sulfamethoxazole-trimethoprim (BACTRIM DS) 800-160 MG tablet [id number] Yes Take 1 tablet by mouth every Monday, Wednesday, and Friday. [provider]  Active Self, Pharmacy Records  SYMBICORT  160-4.5 MCG/ACT inhaler [id number] Yes Inhale 2 puffs into the lungs in the morning and at bedtime. Iva Marty Saltness,  MD  Active Self, Pharmacy Records  tacrolimus (PROGRAF) 1 MG capsule [id number] Yes Take 1 mg by mouth daily. 2 tablets by mouth daily  Patient taking differently: Take 1 mg by mouth daily. Extended release 2 tablets by mouth daily   [provider]  Active   tacrolimus ER (ENVARSUS XR) 4 MG TB24 [id number] Yes Take 9 mg by mouth. Take 8mg  ER tablets and 1mg  ER tablet at 0900 for total of 9mg  ER.  Patient taking differently: Take 4 mg by mouth. 2 tablets daily   [provider]  Active Self, Pharmacy Records           Med Note (WHITE, DONETA RAMAN   Thu Sep 01, 2024  4:22 AM) Taking 9 mg daily   valGANciclovir (VALCYTE) 450 MG tablet [id number] Yes Take 450 mg by mouth every other day.  Patient  taking differently: Take 450 mg by mouth daily.   [provider]  Active   Med List Note Isabel Doneta RAMAN, CPhT 09/01/24 9579): Transplant 9/11            Home Care and Equipment/Supplies: Were Home Health Services Ordered?: No Any new equipment or medical supplies ordered?: No  Functional Questionnaire: Do you need assistance with bathing/showering or dressing?: No Do you need assistance with meal preparation?: No Do you need assistance with eating?: No Do you have difficulty maintaining continence: No Do you need assistance with getting out of bed/getting out of a chair/moving?: No Do you have difficulty managing or taking your medications?: No  Follow up appointments reviewed: PCP Follow-up appointment confirmed?: No (patient states he will send a message to his provider in Mychart regarding a need for a hospital follow up appointment) Specialist Hospital Follow-up appointment confirmed?: Yes Date of Specialist follow-up appointment?: 09/14/24 Follow-Up Specialty Provider:: Dr. Almarie Kotzen/ Follow up with cardiology Jon Hails, NP 10/10/24 Do you need transportation to your follow-up appointment?: No Do you understand care options if your condition(s) worsen?: Yes-patient verbalized understanding  SDOH Interventions Today    Flowsheet Row Most Recent Value  SDOH Interventions   Food Insecurity Interventions Intervention Not Indicated  Housing Interventions Intervention Not Indicated  Transportation Interventions Intervention Not Indicated  Utilities Interventions Intervention Not Indicated   Discussed and offered 30 day TOC program.  Patient declined.  The patient has been provided with contact information for the care management team and has been advised to call with any health -related questions or concerns.  The patient verbalized understanding with current plan of care.  The patient is directed to their insurance card regarding availability of benefits  coverage.   Arvin Seip RN, BSN, CCM [employer], Population Health Case Manager Phone: [phone]

## 2024-09-15 ENCOUNTER — Other Ambulatory Visit: Payer: Self-pay | Admitting: Allergy & Immunology

## 2024-09-16 ENCOUNTER — Other Ambulatory Visit: Payer: Self-pay | Admitting: Pulmonary Disease

## 2024-09-16 DIAGNOSIS — G4733 Obstructive sleep apnea (adult) (pediatric): Secondary | ICD-10-CM

## 2024-09-21 ENCOUNTER — Encounter: Payer: Self-pay | Admitting: Pulmonary Disease

## 2024-09-21 ENCOUNTER — Ambulatory Visit (INDEPENDENT_AMBULATORY_CARE_PROVIDER_SITE_OTHER): Admitting: Pulmonary Disease

## 2024-09-21 VITALS — BP 146/86 | HR 77 | Ht 70.0 in | Wt 205.0 lb

## 2024-09-21 DIAGNOSIS — G4733 Obstructive sleep apnea (adult) (pediatric): Secondary | ICD-10-CM

## 2024-09-21 NOTE — Patient Instructions (Addendum)
 I will see you back in about 8 weeks  Trying to get into a routine of making sure you turn on the device every night  We did change your amplitude today to 2.3 level four, try and make sure you are adjusting the levels on a weekly or 2 weekly basis for her to continue to treat your sleep apnea  Make sure you get in contact with us  with any concerns  Your sleep study is scheduled for February 16, will like to see you once before the study  You had a CAT scan done September 2025-the spot in the lung is stable

## 2024-09-21 NOTE — Progress Notes (Signed)
 Albert Eaton    [id number]    August 19, 1966  Primary Care Physician:Cable, Lynwood HERO, NP  Referring Physician: [name], NP [address] [place],  [place] [address]  Chief complaint:    Patient with obstructive sleep apnea, status post inspire device placement  Has not been using the device on a regular basis, has had some health issues addressed recently  Recently had a kidney transplant A follow-up hospitalization afterwards  Was not having any significant problems using the device previously  Discussed the use of AI scribe software for clinical note transcription with the patient, who gave verbal consent to proceed.  History of Present Illness Albert Eaton [place] is a 58 year old male who presents for follow-up after a recent kidney transplant.  He underwent a kidney transplant on September 11th and has been recovering since. Initially, he was hospitalized for the procedure and had to return due to breathing issues. He has been home for about one to two weeks. Post-transplant, his creatinine levels are slowly decreasing, and he is no longer on dialysis. He attends follow-up appointments at [name] twice a week for blood draws and other evaluations.  He has a history of sleep apnea and has an implanted device to manage this condition. He has not been using the device regularly since the transplant due to the hospitalizations and recovery process. He attempted to use it once since returning home but did not feel it was effective.  He feels relatively well otherwise End-stage renal disease-recent transplant Congestive heart failure - Stable History of HIV-compliant with treatment History of asthma-stable Lung nodule-stable nodule at 5 mm   Outpatient Encounter Medications as of 09/21/2024  Medication Sig   acetaminophen  (TYLENOL ) 500 MG tablet Take 500 mg by mouth every 6 (six) hours as needed.   allopurinol  (ZYLOPRIM ) 100 MG tablet TAKE ONE TABLET BY MOUTH  EVERY DAY   amLODipine  (NORVASC ) 2.5 MG tablet Take 1 tablet (2.5 mg total) by mouth daily at 8 pm.   apixaban  (ELIQUIS ) 5 MG TABS tablet Take 1 tablet (5 mg total) by mouth 2 (two) times daily.   aspirin  EC 81 MG tablet Take 81 mg by mouth daily. Swallow whole.   azelastine  (ASTELIN ) 0.1 % nasal spray place TWO SPRAY IN EACH NOSTRIL TWICE DAILY   bictegravir-emtricitabine -tenofovir  AF (BIKTARVY ) 50-200-25 MG TABS tablet Take 1 tablet by mouth daily.   cetirizine  (ZYRTEC ) 10 MG tablet TAKE ONE TABLET BY MOUTH EVERY DAY AT 9.00 IN THE MORNING   ezetimibe  (ZETIA ) 10 MG tablet Take 10 mg by mouth daily.   famotidine  (PEPCID ) 20 MG tablet TAKE ONE TABLET BY MOUTH TWICE DAILY @ 9 a.m.- FIVE p.m.   fluticasone  (FLOVENT  HFA) 110 MCG/ACT inhaler Inhale 2 puffs into the lungs 2 (two) times daily as needed (for flares).   furosemide  (LASIX ) 40 MG tablet Take 40 mg by mouth daily.   gabapentin  (NEURONTIN ) 100 MG capsule Take 100 mg by mouth 3 (three) times daily.   ipratropium (ATROVENT ) 0.06 % nasal spray instill TWO SPRAYS IN EACH NOSTRIL 2-3 times DAILY AS NEEDED   ipratropium-albuterol  (DUONEB) 0.5-2.5 (3) MG/3ML SOLN Take 3 mLs by nebulization every 6 (six) hours as needed (Shortness of breath, cough, wheeze or chest tightness).   isosorbide  mononitrate (IMDUR ) 30 MG 24 hr tablet Take 0.5 tablets (15 mg total) by mouth at bedtime. (Patient taking differently: Take 30 mg by mouth at bedtime. Taking 1 tablet by  mouth daily)   lactulose  (CHRONULAC ) 10 GM/15ML solution Take 45 mLs (30 g total) by mouth daily as needed. Take 15-30 ml up to twice daily for constipation   levalbuterol  (XOPENEX  HFA) 45 MCG/ACT inhaler INHALE TWO puffs into THE lungs EVERY FOUR HOURS AS NEEDED wheezing   levothyroxine  (SYNTHROID ) 25 MCG tablet TAKE ONE TABLET BY MOUTH DAILY AT EIGHT a.m. DAILY BEFORE BREAKFAST   lubiprostone  (AMITIZA ) 24 MCG capsule TAKE ONE CAPSULE BY MOUTH TWICE DAILY WITH a meal (Patient taking differently:  24 mcg. T capsule by mouth daily as needed for constipation)   melatonin 3 MG TABS tablet Take 3 mg by mouth at bedtime.   methocarbamol  (ROBAXIN ) 500 MG tablet Take 500 mg by mouth 3 (three) times daily as needed for muscle spasms.   metoprolol  tartrate (LOPRESSOR ) 25 MG tablet TAKE ONE TABLET BY MOUTH TWICE DAILY (Patient taking differently: Take 12.5 mg by mouth 2 (two) times daily.)   mycophenolate  (MYFORTIC ) 180 MG EC tablet Take 2 tablets by mouth in the morning, at noon, and at bedtime.   Olopatadine  HCl (PATADAY ) 0.7 % SOLN Place 1 drop into both eyes 2 (two) times daily as needed (allergies).   pantoprazole  (PROTONIX ) 40 MG tablet Take 1 tablet (40 mg total) by mouth daily.   predniSONE  (DELTASONE ) 5 MG tablet Take 5 mg by mouth daily with breakfast.   rOPINIRole  (REQUIP ) 1 MG tablet Take 1 tablet (1 mg total) by mouth at bedtime.   rosuvastatin  (CRESTOR ) 10 MG tablet Take 1 tablet (10 mg total) by mouth daily.   sodium zirconium cyclosilicate  (LOKELMA ) 5 g packet Take 5 g twice a week, Mondays and Thursdays   Spacer/Aero-Holding Chambers DEVI 1 Device by Does not apply route 2 (two) times a day.   sulfamethoxazole -trimethoprim  (BACTRIM  DS) 800-160 MG tablet Take 1 tablet by mouth every Monday, Wednesday, and Friday.   SYMBICORT  160-4.5 MCG/ACT inhaler Inhale 2 puffs into the lungs in the morning and at bedtime.   tacrolimus  (PROGRAF ) 1 MG capsule Take 1 mg by mouth daily. 2 tablets by mouth daily (Patient taking differently: Take 1 mg by mouth daily. Extended release 2 tablets by mouth daily)   tacrolimus  ER (ENVARSUS  XR) 4 MG TB24 Take 9 mg by mouth. Take 8mg  ER tablets and 1mg  ER tablet at 0900 for total of 9mg  ER. (Patient taking differently: Take 4 mg by mouth. 2 tablets daily)   valGANciclovir  (VALCYTE ) 450 MG tablet Take 450 mg by mouth every other day. (Patient taking differently: Take 450 mg by mouth daily.)   No facility-administered encounter medications on file as of  09/21/2024.    Allergies as of 09/21/2024 - Review Complete 09/14/2024  Allergen Reaction Noted   Ace inhibitors Cough and Other (See Comments) 05/07/2015    Past Medical History:  Diagnosis Date   Anemia    Aortic atherosclerosis    Arthritis    Ascending aorta dilation 02/17/2023   Limited TTE 02/17/23: no effusion, s/p MV repair w trivial residual MR, asc aorta 39 mm, mod TR, severe LAE   Asthma    Cancer (HCC)    renal cyst   Chronic diastolic CHF (congestive heart failure) (HCC) 01/06/2017   Echo 7/16 [facility] in [place], [place]) Mild AI, mild LAE, mild concentric LVH, EF 55, normal wall motion, mild to moderate MR, mild PI, RVSP 55 // Echo 10/09/16 ([facility]):  Moderate LVH, grade 2 diastolic dysfunction, mild MR, moderate LAE    Dysrhythmia    paroxysmal afib/flutter  Esophagitis    ESRD (end stage renal disease) on dialysis [facility])    Dialysis Mon Wed Fri   GERD (gastroesophageal reflux disease)    Gout    no current problems   Heart murmur    had it since he was a baby- pt had echo 10/01/23   Hepatitis    Hep B   HIV (human immunodeficiency virus infection) (HCC)    HLD (hyperlipidemia)    Hypertension    Hypothyroidism    Mitral regurgitation    Myocardial infarction (HCC)    Neuropathy    PONV (postoperative nausea and vomiting)    Sleep apnea    does not use c-pap   Stroke [facility])    Wears glasses    Wears partial dentures     Past Surgical History:  Procedure Laterality Date   AV FISTULA PLACEMENT Left 07/02/2018   Procedure: Creation of Left arm BRACHIOBASILIC ARTERIOVENOUS  FISTULA;  Surgeon: Gretta Lonni PARAS, MD;  Location: [facility] OR;  Service: Vascular;  Laterality: Left;   BASCILIC VEIN TRANSPOSITION Left 08/27/2018   Procedure: Left arm BRACHIOBASILIC VEIN TRANSPOSITION SECOND STAGE;  Surgeon: Gretta Lonni PARAS, MD;  Location: [facility] OR;  Service: Vascular;  Laterality: Left;   BUBBLE STUDY  09/03/2020   Procedure: BUBBLE STUDY;  Surgeon: Okey Vina GAILS, MD;  Location: [facility] ENDOSCOPY;  Service: Cardiovascular;;   BUBBLE STUDY  06/03/2022   Procedure: BUBBLE STUDY;  Surgeon: Loni Soyla LABOR, MD;  Location: [facility] ENDOSCOPY;  Service: Cardiology;;   CAPD INSERTION N/A 05/30/2021   Procedure: LAPAROSCOPIC INSERTION CONTINUOUS AMBULATORY PERITONEAL DIALYSIS  (CAPD) CATHETER;  Surgeon: Magda Debby SAILOR, MD;  Location: [facility] OR;  Service: Vascular;  Laterality: N/A;   CAPD REMOVAL N/A 02/13/2022   Procedure: PERITONEAL DIALYSIS CATHETER REMOVAL;  Surgeon: Magda Debby SAILOR, MD;  Location: [facility] OR;  Service: Vascular;  Laterality: N/A;   CHOLECYSTECTOMY     COLONOSCOPY W/ BIOPSIES AND POLYPECTOMY     DRUG INDUCED ENDOSCOPY Bilateral 07/06/2023   Procedure: DRUG INDUCED SLEEP ENDOSCOPY;  Surgeon: Carlie Clark, MD;  Location: [facility] OR;  Service: ENT;  Laterality: Bilateral;   GIVENS CAPSULE STUDY N/A 03/01/2018   Procedure: GIVENS CAPSULE STUDY;  Surgeon: Albertus Gordy HERO, MD;  Location: [facility] ENDOSCOPY;  Service: Gastroenterology;  Laterality: N/A;   HERNIA REPAIR     As baby   IMPLANTATION OF HYPOGLOSSAL NERVE STIMULATOR Right 01/14/2024   Procedure: RIGHT IMPLANTATION OF HYPOGLOSSAL NERVE STIMULATOR;  Surgeon: Carlie Clark, MD;  Location: [facility] OR;  Service: ENT;  Laterality: Right;   INSERTION OF DIALYSIS CATHETER Right 01/20/2024   Procedure: INSERTION OF DIALYSIS CATHETER USING 19cm PALINDROME CHRONIC CATHETER INTO THE RIGHT INTERNAL JUGULAR;  Surgeon: Gretta Lonni PARAS, MD;  Location: [facility] OR;  Service: Vascular;  Laterality: Right;   LEFT HEART CATH AND CORONARY ANGIOGRAPHY N/A 09/11/2022   Procedure: LEFT HEART CATH AND CORONARY ANGIOGRAPHY;  Surgeon: Dann Candyce RAMAN, MD;  Location: [facility] INVASIVE CV LAB;  Service: Cardiovascular;  Laterality: N/A;   MITRAL VALVE REPAIR N/A 08/21/2022   Procedure: MINIMALLY INVASIVE MITRAL VALVE REPAIR USING RING ANNULOPLASTY SIMULUS 30mm;  Surgeon: Maryjane Mt, MD;  Location: [facility] OR;  Service: Open Heart Surgery;   Laterality: N/A;   MULTIPLE TOOTH EXTRACTIONS     NEPHRECTOMY Left 02/18/2021   NOSE SURGERY     RENAL BIOPSY     REVISON OF ARTERIOVENOUS FISTULA Left 01/20/2024   Procedure: REVISON OF ARTERIOVENOUS FISTULA WITH ARTERIAL PLICATION;  Surgeon: Gretta Lonni PARAS,  MD;  Location: [facility] OR;  Service: Vascular;  Laterality: Left;   REVISON OF ARTERIOVENOUS FISTULA Left 09/08/2024   Procedure: LEFT ARM BANDINGOF ARTERIOVENOUS FISTULA;  Surgeon: Magda Debby SAILOR, MD;  Location: [facility] OR;  Service: Vascular;  Laterality: Left;  LEFT ARM AV FISTULA BANDING   RIGHT HEART CATH N/A 09/02/2024   Procedure: RIGHT HEART CATH;  Surgeon: Wendel Lurena POUR, MD;  Location: [facility] INVASIVE CV LAB;  Service: Cardiovascular;  Laterality: N/A;   RIGHT/LEFT HEART CATH AND CORONARY ANGIOGRAPHY N/A 09/04/2021   Procedure: RIGHT/LEFT HEART CATH AND CORONARY ANGIOGRAPHY;  Surgeon: Verlin Lonni BIRCH, MD;  Location: [facility] INVASIVE CV LAB;  Service: Cardiovascular;  Laterality: N/A;   RIGHT/LEFT HEART CATH AND CORONARY ANGIOGRAPHY N/A 08/01/2022   Procedure: RIGHT/LEFT HEART CATH AND CORONARY ANGIOGRAPHY;  Surgeon: Wonda Sharper, MD;  Location: [facility] INVASIVE CV LAB;  Service: Cardiovascular;  Laterality: N/A;   TEE WITHOUT CARDIOVERSION N/A 09/03/2020   Procedure: TRANSESOPHAGEAL ECHOCARDIOGRAM (TEE);  Surgeon: Okey Vina GAILS, MD;  Location: [facility] ENDOSCOPY;  Service: Cardiovascular;  Laterality: N/A;   TEE WITHOUT CARDIOVERSION N/A 06/03/2022   Procedure: TRANSESOPHAGEAL ECHOCARDIOGRAM (TEE);  Surgeon: Loni Soyla LABOR, MD;  Location: [facility] ENDOSCOPY;  Service: Cardiology;  Laterality: N/A;   TEE WITHOUT CARDIOVERSION N/A 08/21/2022   Procedure: TRANSESOPHAGEAL ECHOCARDIOGRAM (TEE);  Surgeon: Maryjane Mt, MD;  Location: [facility] OR;  Service: Open Heart Surgery;  Laterality: N/A;   UPPER GI ENDOSCOPY  04/05/2021   WISDOM TOOTH EXTRACTION      Family History  Problem Relation Age of Onset   Hypertension Mother    Heart failure  Mother    Hypertension Father    Alcohol abuse Father    Hypertension Sister    Multiple sclerosis Sister    Hypertension Sister    Hypertension Brother    Heart attack Maternal Grandmother 65   Scoliosis Other    Allergic rhinitis Neg Hx    Angioedema Neg Hx    Asthma Neg Hx    Eczema Neg Hx    Immunodeficiency Neg Hx    Urticaria Neg Hx    Colon cancer Neg Hx    Pancreatic cancer Neg Hx    Esophageal cancer Neg Hx     Social History   Socioeconomic History   Marital status: Single    Spouse name: Not on file   Number of children: 0   Years of education: Not on file   Highest education level: Not on file  Occupational History   Not on file  Tobacco Use   Smoking status: Former    Current packs/day: 0.00    Average packs/day: 1 pack/day for 18.0 years (18.0 ttl pk-yrs)    Types: Cigarettes    Start date: 75    Quit date: 2000    Years since quitting: 25.9   Smokeless tobacco: Never  Vaping Use   Vaping status: Never Used  Substance and Sexual Activity   Alcohol use: Yes    Comment: just special occasions/holidays   Drug use: No   Sexual activity: Not Currently    Partners: Male  Other Topics Concern   Not on file  Social History Narrative   Disability      Hobbie: none   Social Drivers of Health   Financial Resource Strain: High Risk (07/13/2023)   Overall Financial Resource Strain (CARDIA)    Difficulty of Paying Living Expenses: Hard  Food Insecurity: No Food Insecurity (09/14/2024)   Hunger Vital Sign    Worried About  Running Out of Food in the Last Year: Never true    Ran Out of Food in the Last Year: Never true  Transportation Needs: No Transportation Needs (09/14/2024)   PRAPARE - Administrator, Civil Service (Medical): No    Lack of Transportation (Non-Medical): No  Physical Activity: Inactive (07/13/2023)   Exercise Vital Sign    Days of Exercise per Week: 0 days    Minutes of Exercise per Session: 0 min  Stress: No Stress  Concern Present (07/13/2023)   [employer] of Occupational Health - Occupational Stress Questionnaire    Feeling of Stress : Not at all  Social Connections: Socially Isolated (07/13/2023)   Social Connection and Isolation Panel    Frequency of Communication with Friends and Family: More than three times a week    Frequency of Social Gatherings with Friends and Family: More than three times a week    Attends Religious Services: Never    Database Administrator or Organizations: No    Attends Banker Meetings: Never    Marital Status: Never married  Intimate Partner Violence: Not At Risk (09/14/2024)   Humiliation, Afraid, Rape, and Kick questionnaire    Fear of Current or Ex-Partner: No    Emotionally Abused: No    Physically Abused: No    Sexually Abused: No    Review of Systems  Constitutional:  Negative for fatigue.  Respiratory:  Positive for apnea. Negative for shortness of breath.   Psychiatric/Behavioral:  Positive for sleep disturbance.     Vitals:   09/21/24 0906  BP: (!) 146/86  Pulse: 77  SpO2: 98%     Physical Exam Constitutional:      Appearance: He is obese.  HENT:     Head: Normocephalic.     Mouth/Throat:     Mouth: Mucous membranes are moist.  Cardiovascular:     Rate and Rhythm: Normal rate and regular rhythm.     Heart sounds: No murmur heard.    No friction rub.  Pulmonary:     Effort: No respiratory distress.     Breath sounds: No stridor. No wheezing or rhonchi.  Neurological:     Mental Status: He is alert.     Cranial Nerves: No cranial nerve deficit.    Data Reviewed: CT chest 06/20/2024 reviewed showing a stable 5 mm nodule No follow-up needed  No significant use of his device recently  Came in at an amplitude of 1.7 Amplitude reset to 2.3 with a range of 2.2-3.2, start delay of 60 minutes, pause of 30 minutes, duration of 8 hours  Assessment and Plan Assessment & Plan Obstructive sleep apnea Non-compliance to  inspire device therapy due to recent kidney transplant and hospitalizations.  Inspire device settings may require adjustment due to potential threshold changes post-surgery. No significant side effects from inspire device use. Emphasized the importance of regular inspire device use to protect heart, lungs, and brain from sleep apnea effects. Discussed potential for improved energy levels and sleep quality with optimal device use - Adjusted amplitude to 2.2 to 3.2 volts. - Instructed to gradually increase CPAP levels weekly or biweekly as tolerated. - Advised to establish a routine of nightly CPAP use. - Scheduled follow-up with Doctor Valorie before February sleep study.  End-stage renal disease - S/p recent transplant - Doing well  Lung nodule - Stable - No follow-up CT needed at present  HIV disease - Compliant with treatment  History of diastolic heart failure -  Stable - Not having significant symptoms  Pulmonary artery hypertension - Stable - Follows up with cardiology   No orders of the defined types were placed in this encounter.     Jennet Epley MD [facility] 09/21/2024, 9:30 AM  CC: [name], NP

## 2024-09-21 NOTE — Progress Notes (Signed)
 The ASCVD Risk score (Arnett DK, et al., 2019) failed to calculate for the following reasons:   Risk score cannot be calculated because patient has a medical history suggesting prior/existing ASCVD   Currently prescribed rosuvastatin  10 mg.  Rolande Moe, BSN, RN

## 2024-09-29 ENCOUNTER — Ambulatory Visit

## 2024-09-29 ENCOUNTER — Other Ambulatory Visit: Payer: Self-pay | Admitting: Internal Medicine

## 2024-09-29 VITALS — BP 136/86 | Ht 68.0 in | Wt 210.8 lb

## 2024-09-29 DIAGNOSIS — Z0000 Encounter for general adult medical examination without abnormal findings: Secondary | ICD-10-CM

## 2024-09-29 NOTE — Progress Notes (Signed)
 Chief Complaint  Patient presents with   Medicare Wellness     Subjective:   Albert Eaton is a 58 y.o. male who presents for a Medicare Annual Wellness Visit.  Visit info / Clinical Intake: Medicare Wellness Visit Type:: Subsequent Annual Wellness Visit Persons participating in visit and providing information:: patient Medicare Wellness Visit Mode:: In-person (required for WTM) Interpreter Needed?: No Pre-visit prep was completed: yes AWV questionnaire completed by patient prior to visit?: no Living arrangements:: (!) lives alone Patient's Overall Health Status Rating: (!) poor Typical amount of pain: some Does pain affect daily life?: (!) yes Are you currently prescribed opioids?: no  Dietary Habits and Nutritional Risks How many meals a day?: 5 (small meals) Eats fruit and vegetables daily?: yes Most meals are obtained by: preparing own meals In the last 2 weeks, have you had any of the following?: (!) nausea, vomiting, diarrhea (diarrhes) Diabetic:: no  Functional Status Activities of Daily Living (to include ambulation/medication): Independent Ambulation: Independent Medication Administration: Independent Home Management (perform basic housework or laundry): Independent Manage your own finances?: yes Primary transportation is: driving Concerns about vision?: no *vision screening is required for WTM* Concerns about hearing?: no  Fall Screening Falls in the past year?: 0 Number of falls in past year: 0 Was there an injury with Fall?: 0 Fall Risk Category Calculator: 0 Patient Fall Risk Level: Low Fall Risk  Fall Risk Patient at Risk for Falls Due to: No Fall Risks Fall risk Follow up: Education provided; Falls prevention discussed  Home and Transportation Safety: All rugs have non-skid backing?: N/A, no rugs All stairs or steps have railings?: yes Grab bars in the bathtub or shower?: (!) no Have non-skid surface in bathtub or shower?: yes Good home  lighting?: yes Regular seat belt use?: yes Hospital stays in the last year:: (!) yes (5 hospital stays :t stay 10days in Nov2025) How many hospital stays:: 3 Reason: Breathing issues in Nov 2025  Cognitive Assessment Difficulty concentrating, remembering, or making decisions? : yes Will 6CIT or Mini Cog be Completed: yes What year is it?: 0 points What month is it?: 0 points Give patient an address phrase to remember (5 components): [address] [place]  About what time is it?: 0 points Count backwards from 20 to 1: 0 points Say the months of the year in reverse: 0 points Repeat the address phrase from earlier: 2 points 6 CIT Score: 2 points  Advance Directives (For Healthcare) Does Patient Have a Medical Advance Directive?: No Would patient like information on creating a medical advance directive?: No - Patient declined  Reviewed/Updated  Reviewed/Updated: Reviewed All (Medical, Surgical, Family, Medications, Allergies, Care Teams, Patient Goals)    Allergies (verified) Ace inhibitors   Current Medications (verified) Outpatient Encounter Medications as of 09/29/2024  Medication Sig   acetaminophen  (TYLENOL ) 500 MG tablet Take 500 mg by mouth every 6 (six) hours as needed.   allopurinol  (ZYLOPRIM ) 100 MG tablet TAKE ONE TABLET BY MOUTH EVERY DAY   amLODipine  (NORVASC ) 2.5 MG tablet Take 1 tablet (2.5 mg total) by mouth daily at 8 pm.   apixaban  (ELIQUIS ) 5 MG TABS tablet Take 1 tablet (5 mg total) by mouth 2 (two) times daily.   aspirin  EC 81 MG tablet Take 81 mg by mouth daily. Swallow whole.   azelastine  (ASTELIN ) 0.1 % nasal spray place TWO SPRAY IN EACH NOSTRIL TWICE DAILY   bictegravir-emtricitabine -tenofovir  AF (BIKTARVY ) 50-200-25 MG TABS tablet Take 1 tablet by mouth daily.  cetirizine  (ZYRTEC ) 10 MG tablet TAKE ONE TABLET BY MOUTH EVERY DAY AT 9.00 IN THE MORNING   ezetimibe  (ZETIA ) 10 MG tablet Take 10 mg by mouth daily.   famotidine  (PEPCID ) 20 MG tablet  TAKE ONE TABLET BY MOUTH TWICE DAILY @ 9 a.m.- FIVE p.m.   fluticasone  (FLOVENT  HFA) 110 MCG/ACT inhaler Inhale 2 puffs into the lungs 2 (two) times daily as needed (for flares).   furosemide  (LASIX ) 40 MG tablet Take 40 mg by mouth daily.   gabapentin  (NEURONTIN ) 100 MG capsule Take 100 mg by mouth 3 (three) times daily.   ipratropium (ATROVENT ) 0.06 % nasal spray instill TWO SPRAYS IN EACH NOSTRIL 2-3 times DAILY AS NEEDED   ipratropium-albuterol  (DUONEB) 0.5-2.5 (3) MG/3ML SOLN Take 3 mLs by nebulization every 6 (six) hours as needed (Shortness of breath, cough, wheeze or chest tightness).   isosorbide  mononitrate (IMDUR ) 30 MG 24 hr tablet Take 0.5 tablets (15 mg total) by mouth at bedtime. (Patient taking differently: Take 30 mg by mouth at bedtime. Taking 1 tablet by mouth daily)   lactulose  (CHRONULAC ) 10 GM/15ML solution Take 45 mLs (30 g total) by mouth daily as needed. Take 15-30 ml up to twice daily for constipation   levalbuterol  (XOPENEX  HFA) 45 MCG/ACT inhaler INHALE TWO puffs into THE lungs EVERY FOUR HOURS AS NEEDED wheezing   levothyroxine  (SYNTHROID ) 25 MCG tablet TAKE ONE TABLET BY MOUTH DAILY AT EIGHT a.m. DAILY BEFORE BREAKFAST   lubiprostone  (AMITIZA ) 24 MCG capsule TAKE ONE CAPSULE BY MOUTH TWICE DAILY WITH a meal (Patient taking differently: 24 mcg. T capsule by mouth daily as needed for constipation)   melatonin 3 MG TABS tablet Take 3 mg by mouth at bedtime.   methocarbamol  (ROBAXIN ) 500 MG tablet Take 500 mg by mouth 3 (three) times daily as needed for muscle spasms.   metoprolol  tartrate (LOPRESSOR ) 25 MG tablet TAKE ONE TABLET BY MOUTH TWICE DAILY (Patient taking differently: Take 12.5 mg by mouth 2 (two) times daily.)   mycophenolate  (MYFORTIC ) 180 MG EC tablet Take 2 tablets by mouth in the morning, at noon, and at bedtime.   Olopatadine  HCl (PATADAY ) 0.7 % SOLN Place 1 drop into both eyes 2 (two) times daily as needed (allergies).   pantoprazole  (PROTONIX ) 40 MG  tablet Take 1 tablet (40 mg total) by mouth daily.   predniSONE  (DELTASONE ) 5 MG tablet Take 5 mg by mouth daily with breakfast.   rOPINIRole  (REQUIP ) 1 MG tablet Take 1 tablet (1 mg total) by mouth at bedtime.   rosuvastatin  (CRESTOR ) 10 MG tablet Take 1 tablet (10 mg total) by mouth daily.   sodium zirconium cyclosilicate  (LOKELMA ) 5 g packet Take 5 g twice a week, Mondays and Thursdays   Spacer/Aero-Holding Chambers DEVI 1 Device by Does not apply route 2 (two) times a day.   sulfamethoxazole -trimethoprim  (BACTRIM  DS) 800-160 MG tablet Take 1 tablet by mouth every Monday, Wednesday, and Friday.   SYMBICORT  160-4.5 MCG/ACT inhaler Inhale 2 puffs into the lungs in the morning and at bedtime.   tacrolimus  (PROGRAF ) 1 MG capsule Take 1 mg by mouth daily. 2 tablets by mouth daily (Patient taking differently: Take 1 mg by mouth daily. Extended release 2 tablets by mouth daily)   tacrolimus  ER (ENVARSUS  XR) 4 MG TB24 Take 9 mg by mouth. Take 8mg  ER tablets and 1mg  ER tablet at 0900 for total of 9mg  ER. (Patient taking differently: Take 4 mg by mouth. 2 tablets daily)   valGANciclovir  (VALCYTE ) 450 MG  tablet Take 450 mg by mouth every other day. (Patient taking differently: Take 450 mg by mouth daily.)   No facility-administered encounter medications on file as of 09/29/2024.    History: Past Medical History:  Diagnosis Date   Anemia    Aortic atherosclerosis    Arthritis    Ascending aorta dilation 02/17/2023   Limited TTE 02/17/23: no effusion, s/p MV repair w trivial residual MR, asc aorta 39 mm, mod TR, severe LAE   Asthma    Cancer (HCC)    renal cyst   Chronic diastolic CHF (congestive heart failure) (HCC) 01/06/2017   Echo 7/16 [facility] in [place], [place]) Mild AI, mild LAE, mild concentric LVH, EF 55, normal wall motion, mild to moderate MR, mild PI, RVSP 55 // Echo 10/09/16 ([facility]):  Moderate LVH, grade 2 diastolic dysfunction, mild MR, moderate LAE    Dysrhythmia    paroxysmal  afib/flutter   Esophagitis    ESRD (end stage renal disease) on dialysis [facility])    Dialysis Mon Wed Fri   GERD (gastroesophageal reflux disease)    Gout    no current problems   Heart murmur    had it since he was a baby- pt had echo 10/01/23   Hepatitis    Hep B   HIV (human immunodeficiency virus infection) (HCC)    HLD (hyperlipidemia)    Hypertension    Hypothyroidism    Mitral regurgitation    Myocardial infarction (HCC)    Neuropathy    PONV (postoperative nausea and vomiting)    Sleep apnea    does not use c-pap   Stroke [facility])    Wears glasses    Wears partial dentures    Past Surgical History:  Procedure Laterality Date   AV FISTULA PLACEMENT Left 07/02/2018   Procedure: Creation of Left arm BRACHIOBASILIC ARTERIOVENOUS  FISTULA;  Surgeon: Gretta Lonni PARAS, MD;  Location: [facility] OR;  Service: Vascular;  Laterality: Left;   BASCILIC VEIN TRANSPOSITION Left 08/27/2018   Procedure: Left arm BRACHIOBASILIC VEIN TRANSPOSITION SECOND STAGE;  Surgeon: Gretta Lonni PARAS, MD;  Location: [facility] OR;  Service: Vascular;  Laterality: Left;   BUBBLE STUDY  09/03/2020   Procedure: BUBBLE STUDY;  Surgeon: Okey Vina GAILS, MD;  Location: [facility] ENDOSCOPY;  Service: Cardiovascular;;   BUBBLE STUDY  06/03/2022   Procedure: BUBBLE STUDY;  Surgeon: Loni Soyla LABOR, MD;  Location: [facility] ENDOSCOPY;  Service: Cardiology;;   CAPD INSERTION N/A 05/30/2021   Procedure: LAPAROSCOPIC INSERTION CONTINUOUS AMBULATORY PERITONEAL DIALYSIS  (CAPD) CATHETER;  Surgeon: Magda Debby SAILOR, MD;  Location: [facility] OR;  Service: Vascular;  Laterality: N/A;   CAPD REMOVAL N/A 02/13/2022   Procedure: PERITONEAL DIALYSIS CATHETER REMOVAL;  Surgeon: Magda Debby SAILOR, MD;  Location: [facility] OR;  Service: Vascular;  Laterality: N/A;   CHOLECYSTECTOMY     COLONOSCOPY W/ BIOPSIES AND POLYPECTOMY     DRUG INDUCED ENDOSCOPY Bilateral 07/06/2023   Procedure: DRUG INDUCED SLEEP ENDOSCOPY;  Surgeon: Carlie Clark, MD;  Location: [facility] OR;   Service: ENT;  Laterality: Bilateral;   GIVENS CAPSULE STUDY N/A 03/01/2018   Procedure: GIVENS CAPSULE STUDY;  Surgeon: Albertus Gordy HERO, MD;  Location: [facility] ENDOSCOPY;  Service: Gastroenterology;  Laterality: N/A;   HERNIA REPAIR     As baby   IMPLANTATION OF HYPOGLOSSAL NERVE STIMULATOR Right 01/14/2024   Procedure: RIGHT IMPLANTATION OF HYPOGLOSSAL NERVE STIMULATOR;  Surgeon: Carlie Clark, MD;  Location: [facility] OR;  Service: ENT;  Laterality: Right;   INSERTION OF DIALYSIS  CATHETER Right 01/20/2024   Procedure: INSERTION OF DIALYSIS CATHETER USING 19cm PALINDROME CHRONIC CATHETER INTO THE RIGHT INTERNAL JUGULAR;  Surgeon: Gretta Lonni PARAS, MD;  Location: [facility] OR;  Service: Vascular;  Laterality: Right;   LEFT HEART CATH AND CORONARY ANGIOGRAPHY N/A 09/11/2022   Procedure: LEFT HEART CATH AND CORONARY ANGIOGRAPHY;  Surgeon: Dann Candyce RAMAN, MD;  Location: [facility] INVASIVE CV LAB;  Service: Cardiovascular;  Laterality: N/A;   MITRAL VALVE REPAIR N/A 08/21/2022   Procedure: MINIMALLY INVASIVE MITRAL VALVE REPAIR USING RING ANNULOPLASTY SIMULUS 30mm;  Surgeon: Maryjane Mt, MD;  Location: [facility] OR;  Service: Open Heart Surgery;  Laterality: N/A;   MULTIPLE TOOTH EXTRACTIONS     NEPHRECTOMY Left 02/18/2021   NOSE SURGERY     RENAL BIOPSY     REVISON OF ARTERIOVENOUS FISTULA Left 01/20/2024   Procedure: REVISON OF ARTERIOVENOUS FISTULA WITH ARTERIAL PLICATION;  Surgeon: Gretta Lonni PARAS, MD;  Location: [facility] OR;  Service: Vascular;  Laterality: Left;   REVISON OF ARTERIOVENOUS FISTULA Left 09/08/2024   Procedure: LEFT ARM BANDINGOF ARTERIOVENOUS FISTULA;  Surgeon: Magda Debby SAILOR, MD;  Location: [facility] OR;  Service: Vascular;  Laterality: Left;  LEFT ARM AV FISTULA BANDING   RIGHT HEART CATH N/A 09/02/2024   Procedure: RIGHT HEART CATH;  Surgeon: Wendel Lurena POUR, MD;  Location: [facility] INVASIVE CV LAB;  Service: Cardiovascular;  Laterality: N/A;   RIGHT/LEFT HEART CATH AND CORONARY ANGIOGRAPHY N/A 09/04/2021    Procedure: RIGHT/LEFT HEART CATH AND CORONARY ANGIOGRAPHY;  Surgeon: Verlin Lonni BIRCH, MD;  Location: [facility] INVASIVE CV LAB;  Service: Cardiovascular;  Laterality: N/A;   RIGHT/LEFT HEART CATH AND CORONARY ANGIOGRAPHY N/A 08/01/2022   Procedure: RIGHT/LEFT HEART CATH AND CORONARY ANGIOGRAPHY;  Surgeon: Wonda Sharper, MD;  Location: [facility] INVASIVE CV LAB;  Service: Cardiovascular;  Laterality: N/A;   TEE WITHOUT CARDIOVERSION N/A 09/03/2020   Procedure: TRANSESOPHAGEAL ECHOCARDIOGRAM (TEE);  Surgeon: Okey Vina GAILS, MD;  Location: [facility] ENDOSCOPY;  Service: Cardiovascular;  Laterality: N/A;   TEE WITHOUT CARDIOVERSION N/A 06/03/2022   Procedure: TRANSESOPHAGEAL ECHOCARDIOGRAM (TEE);  Surgeon: Loni Soyla LABOR, MD;  Location: [facility] ENDOSCOPY;  Service: Cardiology;  Laterality: N/A;   TEE WITHOUT CARDIOVERSION N/A 08/21/2022   Procedure: TRANSESOPHAGEAL ECHOCARDIOGRAM (TEE);  Surgeon: Maryjane Mt, MD;  Location: [facility] OR;  Service: Open Heart Surgery;  Laterality: N/A;   UPPER GI ENDOSCOPY  04/05/2021   WISDOM TOOTH EXTRACTION     Family History  Problem Relation Age of Onset   Hypertension Mother    Heart failure Mother    Hypertension Father    Alcohol abuse Father    Hypertension Sister    Multiple sclerosis Sister    Hypertension Sister    Hypertension Brother    Heart attack Maternal Grandmother 32   Scoliosis Other    Allergic rhinitis Neg Hx    Angioedema Neg Hx    Asthma Neg Hx    Eczema Neg Hx    Immunodeficiency Neg Hx    Urticaria Neg Hx    Colon cancer Neg Hx    Pancreatic cancer Neg Hx    Esophageal cancer Neg Hx    Social History   Occupational History   Not on file  Tobacco Use   Smoking status: Former    Current packs/day: 0.00    Average packs/day: 1 pack/day for 18.0 years (18.0 ttl pk-yrs)    Types: Cigarettes    Start date: 72    Quit date: 2000    Years since quitting: 25.9  Smokeless tobacco: Never  Vaping Use   Vaping status: Never Used   Substance and Sexual Activity   Alcohol use: Yes    Comment: just special occasions/holidays   Drug use: No   Sexual activity: Not Currently    Partners: Male   Tobacco Counseling Counseling given: Not Answered  SDOH Screenings   Food Insecurity: No Food Insecurity (09/29/2024)  Housing: Unknown (09/29/2024)  Transportation Needs: No Transportation Needs (09/29/2024)  Utilities: Not At Risk (09/29/2024)  Recent Concern: Utilities - At Risk (09/01/2024)  Alcohol Screen: Low Risk  (07/13/2023)  Depression (PHQ2-9): Low Risk  (09/29/2024)  Financial Resource Strain: High Risk (07/13/2023)  Physical Activity: Inactive (09/29/2024)  Social Connections: Socially Isolated (09/29/2024)  Stress: No Stress Concern Present (09/29/2024)  Tobacco Use: Medium Risk (09/29/2024)  Health Literacy: Adequate Health Literacy (09/29/2024)   See flowsheets for full screening details  Depression Screen PHQ 2 & 9 Depression Scale- Over the past 2 weeks, how often have you been bothered by any of the following problems? Little interest or pleasure in doing things: 0 Feeling down, depressed, or hopeless (PHQ Adolescent also includes...irritable): 0 PHQ-2 Total Score: 0 Trouble falling or staying asleep, or sleeping too much: 2 Feeling tired or having little energy: 2 Poor appetite or overeating (PHQ Adolescent also includes...weight loss): 1 Feeling bad about yourself - or that you are a failure or have let yourself or your family down: 0 Trouble concentrating on things, such as reading the newspaper or watching television (PHQ Adolescent also includes...like school work): 0 Moving or speaking so slowly that other people could have noticed. Or the opposite - being so fidgety or restless that you have been moving around a lot more than usual: 0 Thoughts that you would be better off dead, or of hurting yourself in some way: 0 PHQ-9 Total Score: 5     Goals Addressed             This Visit's Progress    No  other goals at this time       Patient Stated       09/29/24-Still working on it-Exercise more            Objective:    Today's Vitals   09/29/24 0855  BP: 136/86  Weight: 210 lb 12.8 oz (95.6 kg)  Height: 5' 8 (1.727 m)   Body mass index is 32.05 kg/m.  Hearing/Vision screen Vision Screening - Comments:: Pt UTD w/visits to [facility] but transitioned to [facility] in [place] (due to insurance) Immunizations and Health Maintenance Health Maintenance  Topic Date Due   Hepatitis B Vaccines 19-59 Average Risk (1 of 3 - 19+ 3-dose series) 03/28/1985   COVID-19 Vaccine (11 - Pfizer risk 2025-26 season) 02/08/2025   Medicare Annual Wellness (AWV)  09/29/2025   Colonoscopy  05/02/2026   DTaP/Tdap/Td (2 - Td or Tdap) 01/28/2027   Pneumococcal Vaccine: 50+ Years  Completed   Hepatitis C Screening  Completed   HIV Screening  Completed   Zoster Vaccines- Shingrix  Completed   HPV VACCINES  Aged Out   Meningococcal B Vaccine  Aged Out        Assessment/Plan:  This is a routine wellness examination for Lavante.  Patient Care Team: [name], NP as PCP - General (Pain Medicine) Wonda Sharper, MD as PCP - Cardiology (Cardiology) Cindie Ole DASEN, MD as PCP - Electrophysiology (Cardiology) Gearline Norris, MD as Consulting Physician (Nephrology) Bobbitt, Elgin Pepper, MD as Consulting Physician (Allergy and  Immunology) Livingston Rigg, MD as Consulting Physician (Dermatology) [facility] Livingston Rigg, MD as Consulting Physician (Dermatology)  I have personally reviewed and noted the following in the patient's chart:   Medical and social history Use of alcohol, tobacco or illicit drugs  Current medications and supplements including opioid prescriptions. Functional ability and status Nutritional status Physical activity Advanced directives List of other physicians Hospitalizations, surgeries, and ER visits in previous 12  months Vitals Screenings to include cognitive, depression, and falls Referrals and appointments  No orders of the defined types were placed in this encounter.  In addition, I have reviewed and discussed with patient certain preventive protocols, quality metrics, and best practice recommendations. A written personalized care plan for preventive services as well as general preventive health recommendations were provided to patient.   Erminio LITTIE Saris, LPN   [id number]   Return in 1 year (on 09/29/2025).  After Visit Summary: (In Person-Printed) AVS printed and given to the patient  Nurse Notes: Pt has no concerns or questions. Follow-up/AWV/CPE made for one year

## 2024-09-29 NOTE — Patient Instructions (Signed)
 Mr. Rijo,  Thank you for taking the time for your Medicare Wellness Visit. I appreciate your continued commitment to your health goals. Please review the care plan we discussed, and feel free to reach out if I can assist you further.  Please note that Annual Wellness Visits do not include a physical exam. Some assessments may be limited, especially if the visit was conducted virtually. If needed, we may recommend an in-person follow-up with your provider.  Ongoing Care Seeing your primary care provider every 3 to 6 months helps us  monitor your health and provide consistent, personalized care.   Referrals If a referral was made during today's visit and you haven't received any updates within two weeks, please contact the referred provider directly to check on the status.  Recommended Screenings:  Health Maintenance  Topic Date Due   Hepatitis B Vaccine (1 of 3 - 19+ 3-dose series) 03/28/1985   Medicare Annual Wellness Visit  07/12/2024   COVID-19 Vaccine (11 - Pfizer risk 2025-26 season) 02/08/2025   Colon Cancer Screening  05/02/2026   DTaP/Tdap/Td vaccine (2 - Td or Tdap) 01/28/2027   Pneumococcal Vaccine for age over 55  Completed   Hepatitis C Screening  Completed   HIV Screening  Completed   Zoster (Shingles) Vaccine  Completed   HPV Vaccine  Aged Out   Meningitis B Vaccine  Aged Out       09/29/2024    8:55 AM  Advanced Directives  Does Patient Have a Medical Advance Directive? No    Vision: Annual vision screenings are recommended for early detection of glaucoma, cataracts, and diabetic retinopathy. These exams can also reveal signs of chronic conditions such as diabetes and high blood pressure.  Dental: Annual dental screenings help detect early signs of oral cancer, gum disease, and other conditions linked to overall health, including heart disease and diabetes.

## 2024-10-04 NOTE — Nursing Note (Signed)
 Patient presented for ultrasound guided renal biopsy with moderate sedation. 3 passes were made and no bleeds noted. Patients dressing CDI, no concerns, VSS, 4 hour recovery completed and PIV removed per protocol. Patient and caregiver given discharge instructions related to procedure and sedation, all questions answered and patient discharged to lobby with responsible adult.

## 2024-10-05 NOTE — Progress Notes (Deleted)
 Cardiology Office Note:    Date:  10/05/2024   ID:  Albert Eaton, DOB [birth year], MRN [id number]  PCP:  [name], NP   [facility] Providers Cardiologist:  [name], MD Electrophysiologist:  [name], MD { Click to update primary MD,subspecialty MD or APP then REFRESH:1}    Referring MD: [name], NP   No chief complaint on file. ***  History of Present Illness:    Albert Eaton is a 58 y.o. male with a hx of chronic systolic and diastolic heart failure (LVEF 45-50%), atrial flutter/fibrillation not on anticoagulation, moderate CAD, hypertension, history of hypotension with dialysis, pulmonary artery hypertension, ESRD s/p renal transplant 06/2024, OSA with inspire in place, MR s/p MVR in 2023, asthma, hypothyroidism, hepatitis B and HIV.  In 2023 he had a cardiac arrest during dialysis session.  LVEF at that time was 45-50%.  LHC on 08/2022 with 50% stenosis in the proximal to mid LAD, 40% stenosis proximal RCA, 30% stenosis in the second marginal.  Suspect that cardiac arrest was due to a PEA arrest.  Heart monitor worn after discharge showed normal sinus rhythm and A-fib burden was less than 1%.  He has been on Coumadin  for ease of reversibility should he have opportunity for urgent renal transplant.  He underwent renal transplant at [facility] September 2025 and was not restarted on anticoagulation after his surgery.  He has chronic dyspnea on exertion.  On 08/05/2024 a nuclear stress test showed no findings of ischemia or infarction.  He was admitted 08/2024 with acute on chronic HFmrEF.   Echo demonstrated LVEF 50-55%, no RWMA, moderate LVH, normal RV function but moderately enlarged, moderately elevated PASP of 47.3 mmHg, severe BAE, mild MR.   He was diuresed and underwent RHC that demonstrated severely elevated filling pressures, concern for high-output CHF from large AV fistula.  He underwent AVF ligation.  He diuresed and was discharged on 40  mg p.o. Lasix .      Chronic systolic and diastolic heart failure - LVEF 49-44%, RHC last admission with severely elevated filling pressures - Felt related to high output CHF due to large AV fistula - Underwent AVF ligation last admission - Volume managed 40 mg p.o. Lasix  -GDMT limited by renal function - Consider transitioning Lopressor  to Toprol  12.5 mg daily   Hypertension - On 2.5 mg amlodipine , beta-blocker, 15 mg Imdur    History of ESRD s/p renal transplant CKD 4 - AVF ligation 09/08/2024   PAF/flutter -Briefly maintained on Coumadin  prior to transplant for ease of reversibility - Now on Eliquis    CAD - Continue aspirin  and statin, amlodipine , Imdur    Hyperlipidemia with LDL less than 35 - Recent creatinine 1.86 - Remains on 10 mg Crestor , agree with this   Past Medical History:  Diagnosis Date   Anemia    Aortic atherosclerosis    Arthritis    Ascending aorta dilation 02/17/2023   Limited TTE 02/17/23: no effusion, s/p MV repair w trivial residual MR, asc aorta 39 mm, mod TR, severe LAE   Asthma    Cancer (HCC)    renal cyst   Chronic diastolic CHF (congestive heart failure) (HCC) 01/06/2017   Echo 7/16 [facility] in [place], [place]) Mild AI, mild LAE, mild concentric LVH, EF 55, normal wall motion, mild to moderate MR, mild PI, RVSP 55 // Echo 10/09/16 ([facility]):  Moderate LVH, grade 2 diastolic dysfunction, mild MR, moderate LAE    Dysrhythmia    paroxysmal afib/flutter  Esophagitis    ESRD (end stage renal disease) on dialysis [facility])    Dialysis Mon Wed Fri   GERD (gastroesophageal reflux disease)    Gout    no current problems   Heart murmur    had it since he was a baby- pt had echo 10/01/23   Hepatitis    Hep B   HIV (human immunodeficiency virus infection) (HCC)    HLD (hyperlipidemia)    Hypertension    Hypothyroidism    Mitral regurgitation    Myocardial infarction (HCC)    Neuropathy    PONV (postoperative nausea and vomiting)     Sleep apnea    does not use c-pap   Stroke [facility])    Wears glasses    Wears partial dentures     Past Surgical History:  Procedure Laterality Date   AV FISTULA PLACEMENT Left 07/02/2018   Procedure: Creation of Left arm BRACHIOBASILIC ARTERIOVENOUS  FISTULA;  Surgeon: Gretta Lonni PARAS, MD;  Location: [facility] OR;  Service: Vascular;  Laterality: Left;   BASCILIC VEIN TRANSPOSITION Left 08/27/2018   Procedure: Left arm BRACHIOBASILIC VEIN TRANSPOSITION SECOND STAGE;  Surgeon: Gretta Lonni PARAS, MD;  Location: [facility] OR;  Service: Vascular;  Laterality: Left;   BUBBLE STUDY  09/03/2020   Procedure: BUBBLE STUDY;  Surgeon: Okey Vina GAILS, MD;  Location: [facility] ENDOSCOPY;  Service: Cardiovascular;;   BUBBLE STUDY  06/03/2022   Procedure: BUBBLE STUDY;  Surgeon: Loni Soyla LABOR, MD;  Location: [facility] ENDOSCOPY;  Service: Cardiology;;   CAPD INSERTION N/A 05/30/2021   Procedure: LAPAROSCOPIC INSERTION CONTINUOUS AMBULATORY PERITONEAL DIALYSIS  (CAPD) CATHETER;  Surgeon: Magda Debby SAILOR, MD;  Location: [facility] OR;  Service: Vascular;  Laterality: N/A;   CAPD REMOVAL N/A 02/13/2022   Procedure: PERITONEAL DIALYSIS CATHETER REMOVAL;  Surgeon: Magda Debby SAILOR, MD;  Location: [facility] OR;  Service: Vascular;  Laterality: N/A;   CHOLECYSTECTOMY     COLONOSCOPY W/ BIOPSIES AND POLYPECTOMY     DRUG INDUCED ENDOSCOPY Bilateral 07/06/2023   Procedure: DRUG INDUCED SLEEP ENDOSCOPY;  Surgeon: Carlie Clark, MD;  Location: [facility] OR;  Service: ENT;  Laterality: Bilateral;   GIVENS CAPSULE STUDY N/A 03/01/2018   Procedure: GIVENS CAPSULE STUDY;  Surgeon: Albertus Gordy HERO, MD;  Location: [facility] ENDOSCOPY;  Service: Gastroenterology;  Laterality: N/A;   HERNIA REPAIR     As baby   IMPLANTATION OF HYPOGLOSSAL NERVE STIMULATOR Right 01/14/2024   Procedure: RIGHT IMPLANTATION OF HYPOGLOSSAL NERVE STIMULATOR;  Surgeon: Carlie Clark, MD;  Location: [facility] OR;  Service: ENT;  Laterality: Right;   INSERTION OF DIALYSIS CATHETER Right 01/20/2024    Procedure: INSERTION OF DIALYSIS CATHETER USING 19cm PALINDROME CHRONIC CATHETER INTO THE RIGHT INTERNAL JUGULAR;  Surgeon: Gretta Lonni PARAS, MD;  Location: [facility] OR;  Service: Vascular;  Laterality: Right;   LEFT HEART CATH AND CORONARY ANGIOGRAPHY N/A 09/11/2022   Procedure: LEFT HEART CATH AND CORONARY ANGIOGRAPHY;  Surgeon: Dann Candyce RAMAN, MD;  Location: [facility] INVASIVE CV LAB;  Service: Cardiovascular;  Laterality: N/A;   MITRAL VALVE REPAIR N/A 08/21/2022   Procedure: MINIMALLY INVASIVE MITRAL VALVE REPAIR USING RING ANNULOPLASTY SIMULUS 30mm;  Surgeon: Maryjane Mt, MD;  Location: [facility] OR;  Service: Open Heart Surgery;  Laterality: N/A;   MULTIPLE TOOTH EXTRACTIONS     NEPHRECTOMY Left 02/18/2021   NOSE SURGERY     RENAL BIOPSY     REVISON OF ARTERIOVENOUS FISTULA Left 01/20/2024   Procedure: REVISON OF ARTERIOVENOUS FISTULA WITH ARTERIAL PLICATION;  Surgeon: Gretta Lonni PARAS,  MD;  Location: [facility] OR;  Service: Vascular;  Laterality: Left;   REVISON OF ARTERIOVENOUS FISTULA Left 09/08/2024   Procedure: LEFT ARM BANDINGOF ARTERIOVENOUS FISTULA;  Surgeon: Magda Debby SAILOR, MD;  Location: [facility] OR;  Service: Vascular;  Laterality: Left;  LEFT ARM AV FISTULA BANDING   RIGHT HEART CATH N/A 09/02/2024   Procedure: RIGHT HEART CATH;  Surgeon: Wendel Lurena POUR, MD;  Location: [facility] INVASIVE CV LAB;  Service: Cardiovascular;  Laterality: N/A;   RIGHT/LEFT HEART CATH AND CORONARY ANGIOGRAPHY N/A 09/04/2021   Procedure: RIGHT/LEFT HEART CATH AND CORONARY ANGIOGRAPHY;  Surgeon: Verlin Lonni BIRCH, MD;  Location: [facility] INVASIVE CV LAB;  Service: Cardiovascular;  Laterality: N/A;   RIGHT/LEFT HEART CATH AND CORONARY ANGIOGRAPHY N/A 08/01/2022   Procedure: RIGHT/LEFT HEART CATH AND CORONARY ANGIOGRAPHY;  Surgeon: Wonda Sharper, MD;  Location: [facility] INVASIVE CV LAB;  Service: Cardiovascular;  Laterality: N/A;   TEE WITHOUT CARDIOVERSION N/A 09/03/2020   Procedure: TRANSESOPHAGEAL ECHOCARDIOGRAM (TEE);   Surgeon: Okey Vina GAILS, MD;  Location: [facility] ENDOSCOPY;  Service: Cardiovascular;  Laterality: N/A;   TEE WITHOUT CARDIOVERSION N/A 06/03/2022   Procedure: TRANSESOPHAGEAL ECHOCARDIOGRAM (TEE);  Surgeon: Loni Soyla LABOR, MD;  Location: [facility] ENDOSCOPY;  Service: Cardiology;  Laterality: N/A;   TEE WITHOUT CARDIOVERSION N/A 08/21/2022   Procedure: TRANSESOPHAGEAL ECHOCARDIOGRAM (TEE);  Surgeon: Maryjane Mt, MD;  Location: [facility] OR;  Service: Open Heart Surgery;  Laterality: N/A;   UPPER GI ENDOSCOPY  04/05/2021   WISDOM TOOTH EXTRACTION      Current Medications: No outpatient medications have been marked as taking for the 10/10/24 encounter (Appointment) with Madie Jon Garre, PA.     Allergies:   Ace inhibitors   Social History   Socioeconomic History   Marital status: Single    Spouse name: Not on file   Number of children: 0   Years of education: Not on file   Highest education level: Not on file  Occupational History   Not on file  Tobacco Use   Smoking status: Former    Current packs/day: 0.00    Average packs/day: 1 pack/day for 18.0 years (18.0 ttl pk-yrs)    Types: Cigarettes    Start date: 18    Quit date: 2000    Years since quitting: 25.9   Smokeless tobacco: Never  Vaping Use   Vaping status: Never Used  Substance and Sexual Activity   Alcohol use: Yes    Comment: just special occasions/holidays   Drug use: No   Sexual activity: Not Currently    Partners: Male  Other Topics Concern   Not on file  Social History Narrative   Disability      [place]: none   Social Drivers of Health   Financial Resource Strain: High Risk (07/13/2023)   Overall Financial Resource Strain (CARDIA)    Difficulty of Paying Living Expenses: Hard  Food Insecurity: No Food Insecurity (09/29/2024)   Hunger Vital Sign    Worried About Running Out of Food in the Last Year: Never true    Ran Out of Food in the Last Year: Never true  Transportation Needs: No Transportation Needs  (09/29/2024)   PRAPARE - Administrator, Civil Service (Medical): No    Lack of Transportation (Non-Medical): No  Physical Activity: Inactive (09/29/2024)   Exercise Vital Sign    Days of Exercise per Week: 0 days    Minutes of Exercise per Session: 0 min  Stress: No Stress Concern Present (09/29/2024)   [employer]  of Occupational Health - Occupational Stress Questionnaire    Feeling of Stress: Not at all  Social Connections: Socially Isolated (09/29/2024)   Social Connection and Isolation Panel    Frequency of Communication with Friends and Family: More than three times a week    Frequency of Social Gatherings with Friends and Family: More than three times a week    Attends Religious Services: Never    Database Administrator or Organizations: No    Attends Engineer, Structural: Never    Marital Status: Never married     Family History: The patient's ***family history includes Alcohol abuse in his father; Heart attack (age of onset: 19) in his maternal grandmother; Heart failure in his mother; Hypertension in his brother, father, mother, sister, and sister; Multiple sclerosis in his sister; Scoliosis in an other family member. There is no history of Allergic rhinitis, Angioedema, Asthma, Eczema, Immunodeficiency, Urticaria, Colon cancer, Pancreatic cancer, or Esophageal cancer.  ROS:   Please see the history of present illness.    *** All other systems reviewed and are negative.  EKGs/Labs/Other Studies Reviewed:    The following studies were reviewed today: ***      Recent Labs: 08/31/2024: B Natriuretic Peptide 1,578.9 09/02/2024: ALT 59 09/04/2024: TSH 2.552 09/08/2024: Hemoglobin 12.0; Platelets 198 09/09/2024: BUN 43; Creatinine, Ser 3.10; Potassium 5.3; Sodium 134  Recent Lipid Panel    Component Value Date/Time   CHOL 103 08/22/2024 1114   CHOL 188 08/07/2023 1259   TRIG 64 08/22/2024 1114   HDL 45 08/22/2024 1114   HDL 36 (L) 08/07/2023 1259    CHOLHDL 2.3 08/22/2024 1114   VLDL 64 (H) 09/04/2021 0347   LDLCALC 44 08/22/2024 1114   LDLDIRECT 23 08/22/2022 0901     Risk Assessment/Calculations:   {Does this patient have ATRIAL FIBRILLATION?:[phone]}  No BP recorded.  {Refresh Note OR Click here to enter BP  :1}***         Physical Exam:    VS:  There were no vitals taken for this visit.    Wt Readings from Last 3 Encounters:  09/29/24 210 lb 12.8 oz (95.6 kg)  09/21/24 205 lb (93 kg)  09/09/24 199 lb 11.8 oz (90.6 kg)     GEN: *** Well nourished, well developed in no acute distress HEENT: Normal NECK: No JVD; No carotid bruits LYMPHATICS: No lymphadenopathy CARDIAC: ***RRR, no murmurs, rubs, gallops RESPIRATORY:  Clear to auscultation without rales, wheezing or rhonchi  ABDOMEN: Soft, non-tender, non-distended MUSCULOSKELETAL:  No edema; No deformity  SKIN: Warm and dry NEUROLOGIC:  Alert and oriented x 3 PSYCHIATRIC:  Normal affect   ASSESSMENT:    No diagnosis found. PLAN:    In order of problems listed above:  ***      {Are you ordering a CV Procedure (e.g. stress test, cath, DCCV, TEE, etc)?   Press F2        :[id number]}    Medication Adjustments/Labs and Tests Ordered: Current medicines are reviewed at length with the patient today.  Concerns regarding medicines are outlined above.  No orders of the defined types were placed in this encounter.  No orders of the defined types were placed in this encounter.   There are no Patient Instructions on file for this visit.   Signed, Jon Nat Hails, PA  10/05/2024 11:36 AM    [facility]

## 2024-10-10 ENCOUNTER — Ambulatory Visit: Admitting: Physician Assistant

## 2024-10-11 ENCOUNTER — Ambulatory Visit: Admitting: Cardiology

## 2024-10-12 ENCOUNTER — Other Ambulatory Visit: Payer: Self-pay | Admitting: Allergy & Immunology

## 2024-10-14 NOTE — H&P (Signed)
 ------------------------------------------------------------------------------- Attestation signed by Mabelene Verdia Cullen, MD at 10/15/24 1336 I saw and evaluated the patient, participating in the key portions of the service.  I reviewed the resident/subspecialty resident or fellow's note.  I agree with the resident/subspecialty resident or fellow's findings and plan.   I personally reviewed the labs as well and discussed the plan with the resident/subspecialty resident/fellow.  -------------------------------------------------------------------------------   Physician Discharge Summary [facility] 3 [facility] [address] [place] [address] Dept: [phone] Loc: [phone]   Identifying Information:  Albert Eaton [birth year] [id number]  Primary Care Physician: Wendee Lynwood Kid, [place]   Code Status: Full Code  Admit Date: 10/07/2024  Discharge Date: 10/14/2024   Discharge To: Home  Discharge Service: [facility] - Nephrology Floor Team ([facility] [place])   Discharge Attending Physician: MABELENE VERDIA  Discharge Diagnoses:  Principal Problem:   Kidney transplant rejection (HHS-HCC) (POA: Yes) Active Problems:   Congestive heart failure with LV diastolic dysfunction, NYHA class 1 (CMS-HCC) (POA: Yes)   Personal history of gout (POA: Yes)   Mixed hyperlipidemia (POA: Yes)   Hypothyroidism (acquired) (POA: Yes)   Chronic diastolic heart failure (CMS-HCC) (POA: Yes)   Essential hypertension (POA: Yes)   Gout (POA: Yes)   Deceased-donor kidney transplant recipient (HHS-HCC) (POA: Not Applicable) Resolved Problems:   * No resolved hospital problems. [facility] Course:  DDKT 06/2024 c/b delayed graft function  T-cell mediated rejection Patient of Dr. Kotzen. 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 Underwent DDKT 07/07/2024. CMV D+/R+, EBV D+/R+. Transplant complicated by delayed graft function. Underwent renal biopsy 07/2024 showing scarring/necrosis in  transplanted kidney and TCMR [place] 1a, received IV Solumedrol 500x3. Underwent repeat biopsy 12/9, which showed mild tubulointerstitial infiltrates with moderate to focal severe tubulitis, consistent with ongoing cellular rejection, focal transplant arteriopathy, moderate arteriosclerosis with moderate to severe arteriolar hyalinosis, and fiffuse peritubular capillary C4d positivity, most likely related to ABO incompatible transplant status. Admitted for rejection treatment as below, now s/p 5 doses of Thymoglobulin. Received high dose steroids (Methylprednisolone  500mg  IV x3 days (12/12-12/14)) and started steroid taper at discharge. Creatinine stable and normal urine output. Counts appropriately down-trended (ALC 0 at discharge). Plan at discharge was to continue a steroid taper: 20 mg PO daily (12/19-12/20), 10 mg PO daily until outpatient transplant follow up. DDKT immunosuppression medications are Tacrolimus  8mg  daily, Myfortic  360 TID. Continue prophylaxis with pantoprazole , bactrim , and valacyclovir. Adjusted Phos-NaK supplementation (BID -> QID), will need outpatient electrolyte monitoring.  Fever of unknown origin in immunocompromised patient He fevered intermittently during this hospitalization. Initially suspected due to thymoglobulin doses, but continued to occur >24 hours after last dose, making less likely. Infectious work up with two sets of blood cultures negative to date (12/14, 12/16). Negative urine culture, RPP, BK PCR, CMV PCR, HSV/VZV PCR (from mouth swab). EBV PCR mildly elevated 339 (EBV D+/R+). Received 48 hours of empiric vancomycin  and cefepime while awaiting initial culture results. At time of discharge, serum HHV8 PCR and cryptococcal antigen pending. Patient asymptomatic and hemodynamically stable throughout. ICID was consulted. Via shared decision making with patient who strongly desired discharge, decision was made for discharge with close outpatient transplant nephrology and ICID  follow up. Plan for outpatient PET CT if fevers continue to evaluate for PTLD. Return precautions discussed and advised to use home BP cuff and temperature probe (which he already owns) at home to monitor.   Aphthous ulcers Patient notes new mouth pain and possible ulceration on lip and mouth. Images in the chart.  Pain has improved. HSV/VZV negative.    Chronic diastolic CHF  MR s/p TAVR  HTN  Non-obstructive CAD  Hx of atrial flutter/afib  Hx TIA Echo 08/2024 notes EF 50-55%, moderately elevated PA systolic pressure. Left atrium dilated, right atrium dilated, mitral valve s/p biprosthetic valve. Severe Tricuspid regurgitation, and mild aortic calcification. Per chart review, was previously on Warfarin, on hold for renal transplant then restarted on Eliquis  that was held for vascular surgery but resumed at discharge in 08/2024. Continued Amlodipine  5mg , Imdur  30mg , Lipitor 20mg , Aspirin  81mg , Lasix  40mg , Apixaban  5 BID.   Restless leg syndrome Continued Ropinorole.   HIV HIV VL 09/26/2024 undetectable. CD4 216. Continued Biktarvy    Gout Continued allopurinol  100mg .   HLD Patient recently started on Zetia  but has not yet taken. Continue Lipitor 20mg    Hypothyroidism Continue Synthyroid 25mcg   Asthma Continued Breo-Ellipta  The patient's hospital stay has been complicated by the following clinically significant conditions requiring additional evaluation and treatment or having a significant effect of this patient's care:  - Thrombocytopenia POA requiring further investigation or monitor - Anemia POA requiring further investigation or monitoring   Outpatient Provider Follow Up Issues:  [ ]  Nephrology follow up [ ]  PCP follow up [ ]  ID follow up [ ]  Full body PET CT [ ]  Follow up electrolytes on adjusted Phos-NaK supplementation  Touchbase with Outpatient Provider: Warm Handoff: Completed on 10/14/24 by Maurilio LELON Maier, MD  (Resident) via [facility]  Message  Procedures: None ______________________________________________________________________ Discharge Medications:    Your Medication List     PAUSE taking these medications    ezetimibe  10 mg tablet Wait to take this until your doctor or other care provider tells you to start again. Commonly known as: ZETIA  Take 1 tablet (10 mg total) by mouth daily.       STOP taking these medications    cinacalcet  30 MG tablet Commonly known as: SENSIPAR    ipratropium 42 mcg (0.06 %) nasal spray Commonly known as: ATROVENT    levalbuterol  45 mcg/actuation inhaler Commonly known as: XOPENEX  HFA       CHANGE how you take these medications    phosphorus 250 mg tablet Generic drug: sod phos di, mono-K phos mono Take 2 tablets (500 mg total) by mouth Four (4) times a day (before meals and nightly). What changed: when to take this   predniSONE  10 MG tablet Commonly known as: DELTASONE  Take 2 tablets (20 mg total) by mouth daily for 2 days, THEN 1 tablet (10 mg total) daily. Start taking on: October 14, 2024 What changed:  medication strength See the new instructions.       CONTINUE taking these medications    acetaminophen  500 MG tablet Commonly known as: TYLENOL  Take 1-2 tablets (500-1,000 mg total) by mouth every eight (8) hours as needed for pain or fever.   allopurinol  100 MG tablet Commonly known as: ZYLOPRIM  Take 1 tablet (100 mg total) by mouth daily.   amlodipine  5 MG tablet Commonly known as: NORVASC  Take 1 tablet (5 mg total) by mouth daily.   aspirin  81 MG tablet Commonly known as: ECOTRIN Take 1 tablet (81 mg total) by mouth daily.   BIKTARVY  50-200-25 mg tablet Generic drug: bictegrav-emtricit-tenofov ala Take 1 tablet by mouth in the morning.   budesonide -formoterol  160-4.5 mcg/actuation inhaler Commonly known as: SYMBICORT  Inhale 2 puffs two (2) times a day.   cetirizine  10 MG tablet Commonly known as: ZYRTEC  Take 1 tablet (10 mg total)  by mouth daily.  cholecalciferol  (vitamin D3-50 mcg (2,000 unit)) 50 mcg (2,000 unit) Cap Take 1 capsule (50 mcg total) by mouth daily.   ELIQUIS  5 mg Tab Generic drug: apixaban  Take 1 tablet (5 mg total) by mouth two (2) times a day.   ENVARSUS  XR 4 mg Tb24 extended release tablet Generic drug: tacrolimus  Take 2 tablets (8 mg total) by mouth daily.   furosemide  40 MG tablet Commonly known as: LASIX  Take 1 tablet (40 mg total) by mouth daily.   gabapentin  100 MG capsule Commonly known as: NEURONTIN  Take 1 capsule (100 mg total) by mouth Three (3) times a day.   ipratropium-albuterol  0.5-2.5 mg/3 mL nebulizer Commonly known as: DUO-NEB Inhale 3 mL by nebulization every four (4) hours as needed (shortness of breath).   isosorbide  mononitrate 30 MG 24 hr tablet Commonly known as: IMDUR  Take 1 tablet (30 mg total) by mouth daily.   levothyroxine  25 MCG tablet Commonly known as: SYNTHROID  Take 1 tablet (25 mcg total) by mouth daily.   lubiprostone  24 MCG capsule Commonly known as: AMITIZA  Take 1 capsule (24 mcg total) by mouth daily as needed for constipation.   magnesium  (amino acid chelate) 133 mg tablet Generic drug: magnesium  oxide-Mg AA chelate Take 2 tablets by mouth two (2) times a day.   melatonin 3 mg Tab Take 1 tablet (3 mg total) by mouth every evening.   methocarbamol  500 MG tablet Commonly known as: ROBAXIN  Take 1 tablet (500 mg total) by mouth Three (3) times a day as needed (muscle spasms).   metoPROLOL  tartrate 25 MG tablet Commonly known as: Lopressor  Take 1/2 tablets(12.5 mg total) by mouth two (2) times a day.   mycophenolate  180 MG EC tablet Commonly known as: MYFORTIC  Take 2 tablets (360 mg total) by mouth Three (3) times a day.   pantoprazole  40 MG tablet Commonly known as: Protonix  Take 1 tablet (40 mg total) by mouth daily before breakfast.   rOPINIRole  1 MG tablet Commonly known as: REQUIP  Take 1 tablet (1 mg total) by mouth  nightly.   rosuvastatin  10 MG tablet Commonly known as: CRESTOR  Take 1 tablet (10 mg total) by mouth daily.   sulfamethoxazole -trimethoprim  800-160 mg per tablet Commonly known as: BACTRIM  DS Take 1 tablet (160 mg of trimethoprim  total) by mouth every Monday, Wednesday, and Friday.   valGANciclovir  450 mg tablet Commonly known as: VALCYTE  Take 1 tablet (450 mg total) by mouth daily.       Allergies: Ace inhibitors and Lisinopril ______________________________________________________________________ Pending Test Results: Pending Labs     Order Current Status   Blood Culture In process   Blood Culture In process   HHV8 PCR In process   Blood Culture Preliminary result   Blood Culture Preliminary result      Most Recent Labs: All lab results last 24 hours -  Recent Results (from the past 24 hours)  POCT Glucose   Collection Time: 10/13/24 11:23 PM  Result Value Ref Range   Glucose, POC 206 (H) 70 - 179 mg/dL  Renal Function Panel   Collection Time: 10/14/24  4:34 AM  Result Value Ref Range   Sodium 139 135 - 145 mmol/L   Potassium 3.4 3.4 - 4.8 mmol/L   Chloride 100 98 - 107 mmol/L   CO2 24.0 20.0 - 31.0 mmol/L   Anion Gap 15 (H) 5 - 14 mmol/L   BUN 23 9 - 23 mg/dL   Creatinine 8.26 (H) 9.26 - 1.18 mg/dL   BUN/Creatinine Ratio 13  eGFR CKD-EPI (2021) Male 45 (L) >=60 mL/min/1.42m2   Glucose 135 70 - 179 mg/dL   Calcium  9.0 8.7 - 10.4 mg/dL   Phosphorus 1.8 (L) 2.4 - 5.1 mg/dL   Albumin  3.2 (L) 3.4 - 5.0 g/dL  Magnesium  Level   Collection Time: 10/14/24  4:34 AM  Result Value Ref Range   Magnesium  1.8 1.6 - 2.6 mg/dL  Tacrolimus  Level, Trough   Collection Time: 10/14/24  4:34 AM  Result Value Ref Range   Tacrolimus , Trough 6.1 5.0 - 15.0 ng/mL  CBC w/ Differential   Collection Time: 10/14/24  4:34 AM  Result Value Ref Range   WBC 1.6 (L) 3.6 - 11.2 10*9/L   RBC 3.89 (L) 4.26 - 5.60 10*12/L   HGB 12.6 (L) 12.9 - 16.5 g/dL   HCT 63.0 (L) 60.9 - 51.9 %    MCV 95.0 77.6 - 95.7 fL   MCH 32.4 25.9 - 32.4 pg   MCHC 34.1 32.0 - 36.0 g/dL   RDW 84.4 (H) 87.7 - 84.7 %   MPV 10.8 (H) 6.8 - 10.7 fL   Platelet 104 (L) 150 - 450 10*9/L  Manual Differential   Collection Time: 10/14/24  4:34 AM  Result Value Ref Range   Neutrophils % 80 %   Lymphocytes % 3 %   Monocytes % 14 %   Eosinophils % 2 %   Basophils % 1 %   Absolute Neutrophils 1.3 (L) 1.8 - 7.8 10*9/L   Absolute Lymphocytes 0.0 (L) 1.1 - 3.6 10*9/L   Absolute Monocytes 0.2 (L) 0.3 - 0.8 10*9/L   Absolute Eosinophils 0.0 0.0 - 0.5 10*9/L   Absolute Basophils 0.0 0.0 - 0.1 10*9/L   Smear Review Comments See Comment Undefined   Neutrophil Left Shift Present (A) Not Present  Hepatic Function Panel   Collection Time: 10/14/24  4:34 AM  Result Value Ref Range   Albumin  3.3 (L) 3.4 - 5.0 g/dL   Total Protein 6.1 5.7 - 8.2 g/dL   Total Bilirubin 0.3 0.3 - 1.2 mg/dL   Bilirubin, Direct 9.89 0.00 - 0.30 mg/dL   AST 31 <=65 U/L   ALT 52 (H) 10 - 49 U/L   Alkaline Phosphatase 77 46 - 116 U/L  POCT Glucose   Collection Time: 10/14/24  6:29 AM  Result Value Ref Range   Glucose, POC 126 70 - 179 mg/dL  POCT Glucose   Collection Time: 10/14/24  1:18 PM  Result Value Ref Range   Glucose, POC 152 70 - 179 mg/dL    Relevant Studies/Radiology: XR Chest Portable Result Date: 10/12/2024 EXAM: XR CHEST PORTABLE ACCESSION: [id number] UN REPORT DATE: 10/12/2024 8:23 AM CLINICAL INDICATION: FEVER  TECHNIQUE: Single View AP Chest Radiograph. COMPARISON: Chest radiograph 10/09/2024. FINDINGS: Unchanged inspire device projecting over right hemithorax. Diffuse airspace and interstitial opacities with peribronchial cuffing. No pleural effusion or pneumothorax. Stable enlarged cardiac silhouette with surgical changes of mitral valvuloplasty.   Diffuse airspace interstitial opacities which may represent pulmonary edema or pneumonia.   XR Chest Portable Result Date: 10/09/2024 EXAM: XR CHEST PORTABLE  DATE: 10/09/2024 12:39 PM ACCESSION: [id number] UN DICTATED: 10/09/2024 6:27 PM CLINICAL INDICATION: 58 years old Male with FEVER  TECHNIQUE: Portable chest radiograph COMPARISON: Multiple prior chest studies, the most recent XR CHEST PORTABLE 07/11/2024 FINDINGS: HARDWARE: Anticipated imaging associated with a Inspire generator and its associated leads. HEART &  MEDIASTINUM:  Similar prominence of the cardiomediastinal silhouette. Anticipated imaging associated with mitral valve replacement. PULMONARY/PLEURAL SPACE:  Low lung volumes  with associated bibasilar atelectasis. Diffuse interstitial prominence appears to predominate flat bronchial thickening. Otherwise, the lungs are well aerated without overt consolidation. There is no discernible accumulation of gas or fluid within the pleural spaces. However, small effusions may not be evident on AP or PA views of the chest. OTHER:  Unremarkable imaging of the cephalad abdomen   Diffuse interstitial prominence appears to predominately relate to bronchial wall thickening, which likely reflects infectious/inflammatory airway disease or possibly edema.   ECG 12 Lead Result Date: 10/08/2024 SINUS RHYTHM WITH PREMATURE ATRIAL BEATS OTHERWISE WITHIN NORMAL LIMITS WHEN COMPARED WITH ECG OF 13-Jul-2024 09:23, NO SIGNIFICANT CHANGE WAS FOUND Confirmed by Vinie Dunnings ([id number]) on 10/08/2024 9:06:00 PM  ______________________________________________________________________ Discharge Instructions:  Activity Instructions     Activity as tolerated        [facility] of donor kidney cell rejection. You received IV steroids and an IV medication called Thymoglobulin, that depletes the T-cells that are causing the rejection. This means that your immune system is going to be weak for a while, so it is very important that you take prophylactic medications to stop infections (Bactrim , Valgancyclovir). It is also important that you avoid  people who are sick, wear a mask in crowded places, and wash your hands often. While you were here, you had fevers. We did a workup that did not find a cause for these fevers. We want you to keep a close eye on your temperature, symptoms and blood pressure at home. If you have fevers for more than 1 week, low blood pressure, or any symptoms of a new infection (headaches, nausea, vomiting, diarrhea, abdominal pain, pain when you pee, increased urgency or frequency of urination, rashes), please see a doctor. You will have follow up with the infectious disease team scheduled. We also want you to get a type of imaging, called a PET scan, after you leave the hospital if you keep having fevers. We sent a referral for this scan to be done. During your follow up meetings with the infectious disease and transplant teams, they will discuss this more with you. They may decide to cancel the scan if your fever completely resolves. Please carefully read and follow these instructions below upon your discharge: 1) Please take your medications as prescribed and note the changes listed on your discharge. At future follow-up appointments, please be sure to take all of your medications with you so your provider can better guide your care. 2) Seek medical care with your primary care doctor or local Emergency Room or Urgent Care if you develop any changes in your mental status, worsening abdominal pain, fevers greater than 101.5, any unexplained/unrelieved shortness of breath, uncontrolled nausea and vomiting that keeps you from remaining hydrated or taking your medication, or any other concerning symptoms. 3) Please go to your follow-up appointments. Some of your follow-up appointments have been listed below. If you do not see an appointment listed below with your primary care doctor, please call your doctor's office as soon as possible to schedule an appointment to be seen within 7-10 days of discharge.   Follow Up instructions and  Outpatient Referrals    Call MD for:  difficulty breathing, headache or visual disturbances     Call MD for:  extreme fatigue     Call MD for:  persistent dizziness or light-headedness     Call MD for:  persistent nausea or vomiting     Call MD for:  severe uncontrolled pain  Call MD for: Temperature > 38.5 Celsius ( > 101.3 Fahrenheit)     Discharge instructions       Appointments which have been scheduled for you    Oct 19, 2024 9:00 AM (Arrive by 8:45 AM) RETURN VIDEO VISIT MYCHART with Almarie JAYSON Males, MD [facility] INFECTIOUS DISEASES [place] [facility]) [address] [facility] [address] [place] [address] [phone]  Please sign into My [facility] Chart at least 15 minutes before your appointment to complete the eCheck-In process. You must complete eCheck-In before you can start your video visit. We also recommend testing your audio and video connection to troubleshoot any issues before your visit begins. Click Join Video Visit to complete these checks. Once you have completed eCheck-In and tested your audio and video, click Join Call to connect to your visit.   For your video visit, you will need a computer with a working camera, speaker and microphone, a smartphone, or a tablet with internet access.  My [facility] Chart enables you to manage your health, send non-urgent messages to your provider, view your test results, schedule and manage appointments, and request prescription refills securely and conveniently from your computer or mobile device.  You can go to [email or web address] to sign in to your [facility] Chart account with your username and password. If you have forgotten your username or password, please choose the Forgot Username? and/or Forgot Password? links to gain access. You also can access your [facility] Chart account with the free MyChart mobile app for Android or iPhone.  If you need assistance accessing your [facility] Chart  account or for assistance in reaching your provider's office to reschedule or cancel your appointment, please call [facility] [phone].        ______________________________________________________________________ Discharge Day Services: BP 142/82   Pulse 91   Temp 37.9 C (100.2 F) (Oral)   Resp 15   Ht 177.8 cm (5' 10)   Wt 92.9 kg (204 lb 14.4 oz)   SpO2 94%   BMI 29.40 kg/m   Pt seen on the day of discharge and determined appropriate for discharge.  Condition at Discharge: fair  Length of Discharge: I spent greater than 30 mins in the discharge of this patient.

## 2024-10-16 ENCOUNTER — Emergency Department (HOSPITAL_COMMUNITY)

## 2024-10-16 ENCOUNTER — Emergency Department (HOSPITAL_COMMUNITY)
Admission: EM | Admit: 2024-10-16 | Discharge: 2024-10-16 | Disposition: A | Discharge status: Other Acute Inpatient Hospital

## 2024-10-16 ENCOUNTER — Other Ambulatory Visit: Payer: Self-pay

## 2024-10-16 DIAGNOSIS — R21 Rash and other nonspecific skin eruption: Secondary | ICD-10-CM

## 2024-10-16 DIAGNOSIS — I4891 Unspecified atrial fibrillation: Secondary | ICD-10-CM | POA: Insufficient documentation

## 2024-10-16 DIAGNOSIS — R1030 Lower abdominal pain, unspecified: Secondary | ICD-10-CM | POA: Diagnosis present

## 2024-10-16 DIAGNOSIS — Z21 Asymptomatic human immunodeficiency virus [HIV] infection status: Secondary | ICD-10-CM | POA: Insufficient documentation

## 2024-10-16 DIAGNOSIS — Z7401 Bed confinement status: Secondary | ICD-10-CM | POA: Diagnosis not present

## 2024-10-16 DIAGNOSIS — R509 Fever, unspecified: Secondary | ICD-10-CM | POA: Insufficient documentation

## 2024-10-16 DIAGNOSIS — R6889 Other general symptoms and signs: Secondary | ICD-10-CM | POA: Diagnosis not present

## 2024-10-16 DIAGNOSIS — Z7982 Long term (current) use of aspirin: Secondary | ICD-10-CM | POA: Insufficient documentation

## 2024-10-16 DIAGNOSIS — Z743 Need for continuous supervision: Secondary | ICD-10-CM | POA: Diagnosis not present

## 2024-10-16 DIAGNOSIS — N1330 Unspecified hydronephrosis: Secondary | ICD-10-CM | POA: Diagnosis not present

## 2024-10-16 DIAGNOSIS — Z940 Kidney transplant status: Secondary | ICD-10-CM | POA: Diagnosis not present

## 2024-10-16 LAB — COMPREHENSIVE METABOLIC PANEL WITH GFR
ALT: 55 U/L — ABNORMAL HIGH (ref 0–44)
AST: 44 U/L — ABNORMAL HIGH (ref 15–41)
Albumin: 3.7 g/dL (ref 3.5–5.0)
Alkaline Phosphatase: 81 U/L (ref 38–126)
Anion gap: 14 (ref 5–15)
BUN: 24 mg/dL — ABNORMAL HIGH (ref 6–20)
CO2: 19 mmol/L — ABNORMAL LOW (ref 22–32)
Calcium: 8.9 mg/dL (ref 8.9–10.3)
Chloride: 103 mmol/L (ref 98–111)
Creatinine, Ser: 1.81 mg/dL — ABNORMAL HIGH (ref 0.61–1.24)
GFR, Estimated: 43 mL/min — ABNORMAL LOW
Glucose, Bld: 117 mg/dL — ABNORMAL HIGH (ref 70–99)
Potassium: 3.4 mmol/L — ABNORMAL LOW (ref 3.5–5.1)
Sodium: 136 mmol/L (ref 135–145)
Total Bilirubin: 0.7 mg/dL (ref 0.0–1.2)
Total Protein: 6.1 g/dL — ABNORMAL LOW (ref 6.5–8.1)

## 2024-10-16 LAB — I-STAT CG4 LACTIC ACID, ED
Lactic Acid, Venous: 1.6 mmol/L (ref 0.5–1.9)
Lactic Acid, Venous: 3.3 mmol/L (ref 0.5–1.9)

## 2024-10-16 LAB — CBC WITH DIFFERENTIAL/PLATELET
Abs Immature Granulocytes: 0.12 K/uL — ABNORMAL HIGH (ref 0.00–0.07)
Basophils Absolute: 0 K/uL (ref 0.0–0.1)
Basophils Relative: 0 %
Eosinophils Absolute: 0.1 K/uL (ref 0.0–0.5)
Eosinophils Relative: 4 %
HCT: 40.6 % (ref 39.0–52.0)
Hemoglobin: 13.9 g/dL (ref 13.0–17.0)
Immature Granulocytes: 8 %
Lymphocytes Relative: 4 %
Lymphs Abs: 0.1 K/uL — ABNORMAL LOW (ref 0.7–4.0)
MCH: 32.3 pg (ref 26.0–34.0)
MCHC: 34.2 g/dL (ref 30.0–36.0)
MCV: 94.2 fL (ref 80.0–100.0)
Monocytes Absolute: 0.1 K/uL (ref 0.1–1.0)
Monocytes Relative: 8 %
Neutro Abs: 1.2 K/uL — ABNORMAL LOW (ref 1.7–7.7)
Neutrophils Relative %: 76 %
Platelets: 125 K/uL — ABNORMAL LOW (ref 150–400)
RBC: 4.31 MIL/uL (ref 4.22–5.81)
RDW: 13.2 % (ref 11.5–15.5)
Smear Review: NORMAL
WBC: 1.6 K/uL — ABNORMAL LOW (ref 4.0–10.5)
nRBC: 0 % (ref 0.0–0.2)

## 2024-10-16 LAB — URINALYSIS, W/ REFLEX TO CULTURE (INFECTION SUSPECTED)
Bacteria, UA: NONE SEEN
Bilirubin Urine: NEGATIVE
Glucose, UA: NEGATIVE mg/dL
Ketones, ur: NEGATIVE mg/dL
Leukocytes,Ua: NEGATIVE
Nitrite: NEGATIVE
Protein, ur: >300 mg/dL — AB
Specific Gravity, Urine: 1.02 (ref 1.005–1.030)
pH: 6.5 (ref 5.0–8.0)

## 2024-10-16 LAB — RESP PANEL BY RT-PCR (RSV, FLU A&B, COVID)  RVPGX2
Influenza A by PCR: NEGATIVE
Influenza B by PCR: NEGATIVE
Resp Syncytial Virus by PCR: NEGATIVE
SARS Coronavirus 2 by RT PCR: NEGATIVE

## 2024-10-16 LAB — PROTIME-INR
INR: 1 (ref 0.8–1.2)
Prothrombin Time: 13.9 s (ref 11.4–15.2)

## 2024-10-16 MED ORDER — OXYCODONE-ACETAMINOPHEN 5-325 MG PO TABS
1.0000 | ORAL_TABLET | Freq: Once | ORAL | Status: DC
  Filled 2024-10-16: qty 1

## 2024-10-16 MED ORDER — LACTATED RINGERS IV BOLUS (SEPSIS)
1000.0000 mL | Freq: Once | INTRAVENOUS | Status: AC
  Administered 2024-10-16: 1000 mL via INTRAVENOUS

## 2024-10-16 MED ORDER — FENTANYL CITRATE (PF) 50 MCG/ML IJ SOSY
50.0000 ug | PREFILLED_SYRINGE | Freq: Once | INTRAMUSCULAR | Status: AC
  Administered 2024-10-16: 50 ug via INTRAVENOUS
  Filled 2024-10-16: qty 1

## 2024-10-16 MED ORDER — PIPERACILLIN-TAZOBACTAM 3.375 G IVPB 30 MIN
3.3750 g | Freq: Once | INTRAVENOUS | Status: AC
  Administered 2024-10-16: 3.375 g via INTRAVENOUS
  Filled 2024-10-16: qty 50

## 2024-10-16 MED ORDER — ACETAMINOPHEN 10 MG/ML IV SOLN
1000.0000 mg | Freq: Four times a day (QID) | INTRAVENOUS | Status: DC
  Administered 2024-10-16: 1000 mg via INTRAVENOUS
  Filled 2024-10-16: qty 100

## 2024-10-16 MED ORDER — LACTATED RINGERS IV SOLN: INTRAVENOUS | Status: DC

## 2024-10-16 MED ORDER — LINEZOLID 600 MG/300ML IV SOLN
600.0000 mg | Freq: Once | INTRAVENOUS | Status: AC
  Administered 2024-10-16: 600 mg via INTRAVENOUS
  Filled 2024-10-16: qty 300

## 2024-10-16 NOTE — ED Triage Notes (Signed)
 According to [facility]: Pt is coming from home, pt is having groin pain. In September pt had kidney transplant. Pt was last seen in hospital 2x days ago for kidney rejection. Pt has constant pain. Pt has sores but claims is not sexually active. Pt has brown urine.  Vitals 142/80 Hr 85 16 RR 95  spo% 139 cbg Temp 96.0

## 2024-10-16 NOTE — ED Notes (Signed)
 Pt reports blood when he wipes after having a bowel movement and reports a horrible smell coming from groin, perineum and/or buttocks.

## 2024-10-16 NOTE — ED Notes (Signed)
 Difficulty placing line, IV consult placed, phlebotomy asked to stick.

## 2024-10-16 NOTE — ED Notes (Signed)
 Radiology contacted to push images to [facility].

## 2024-10-16 NOTE — Sepsis Progress Note (Addendum)
 Elink following code sepsis  Code Sepsis monitoring discontinued due to discharge to [facility] @ 1522.

## 2024-10-16 NOTE — ED Notes (Signed)
 Rn called to bedside by admission staff due to pt crying out in pain from IV site. IV running linezolid  and lactated ringers  found to be infiltrated. Jon pharmacist called to bedside. According to Jon pharmacist site should be observed, but no antidote needed at this time. Antibiotic and lactated ringers  moved to lower forearm line.

## 2024-10-16 NOTE — ED Notes (Signed)
 Per provider PT taken to ct, venipuncture attempt will resume on return.

## 2024-10-16 NOTE — ED Notes (Signed)
 Lactic results to nick y.rn by at

## 2024-10-16 NOTE — ED Notes (Addendum)
 Report given to RN of [id number] at [facility], hurwhilely rn, no questions or concerns at this time.

## 2024-10-16 NOTE — ED Provider Notes (Signed)
 " [place] [facility] DEPARTMENT AT [facility] Provider Note   CSN: [id number] Arrival date & time: 10/16/24  9154     Patient presents with: groin pain/transplant   Albert Eaton is a 58 y.o. male.   58 year old male with past medical history of HIV and renal transplant who was recently admitted for acute rejection of his kidney presenting to the emergency department today with 2 days of pain in his groin area.  The patient states that he has been having pain in his penis and scrotum as well is around his rectum/anus now for the past 2 days.  The patient states he has been feeling generally unwell with this.  He was recently seen at [facility] and was treated for rejection of his kidney.  He was sent home on prophylactic medications including Bactrim .  He denies any other rashes elsewhere.        Prior to Admission medications  Medication Sig Start Date End Date Taking? Authorizing Provider  acetaminophen  (TYLENOL ) 500 MG tablet Take 500 mg by mouth every 6 (six) hours as needed for moderate pain (pain score 4-6).   Yes [provider]  cinacalcet  (SENSIPAR ) 30 MG tablet Take 30 mg by mouth daily. 09/19/24  Yes [provider]  predniSONE  (DELTASONE ) 10 MG tablet Take by mouth. 10/14/24 11/15/24 Yes [provider]  tacrolimus  ER (ENVARSUS  XR) 4 MG TB24 Take 8 mg by mouth daily before breakfast. 09/26/24 09/26/25 Yes [provider]  allopurinol  (ZYLOPRIM ) 100 MG tablet TAKE ONE TABLET BY MOUTH EVERY DAY 06/10/24   [name], NP  amLODipine  (NORVASC ) 2.5 MG tablet Take 1 tablet (2.5 mg total) by mouth daily at 8 pm. 03/16/24   Wonda Sharper, MD  apixaban  (ELIQUIS ) 5 MG TABS tablet Take 1 tablet (5 mg total) by mouth 2 (two) times daily. 09/09/24   Fairy Frames, MD  aspirin  EC 81 MG tablet Take 81 mg by mouth daily. Swallow whole.    [provider]  azelastine  (ASTELIN ) 0.1 % nasal spray place TWO SPRAY IN EACH NOSTRIL TWICE DAILY  06/10/24   Iva Marty Saltness, MD  bictegravir-emtricitabine -tenofovir  AF (BIKTARVY ) 50-200-25 MG TABS tablet Take 1 tablet by mouth daily. 08/22/24   Dennise Kingsley, MD  cetirizine  (ZYRTEC ) 10 MG tablet TAKE ONE TABLET BY MOUTH EVERY DAY AT 9.00 IN THE MORNING 06/03/24   Iva Marty Saltness, MD  ezetimibe  (ZETIA ) 10 MG tablet Take 10 mg by mouth daily. 02/10/24   [provider]  famotidine  (PEPCID ) 20 MG tablet TAKE ONE TABLET BY MOUTH TWICE DAILY @ 9 a.m.- FIVE p.m. 09/15/24   Iva Marty Saltness, MD  fluticasone  (FLOVENT  HFA) 110 MCG/ACT inhaler Inhale 2 puffs into the lungs 2 (two) times daily as needed (for flares).    [provider]  furosemide  (LASIX ) 40 MG tablet Take 40 mg by mouth daily. 07/15/24 08/10/25  [provider]  gabapentin  (NEURONTIN ) 100 MG capsule Take 100 mg by mouth 3 (three) times daily. 07/15/24   [provider]  ipratropium (ATROVENT ) 0.06 % nasal spray instill TWO SPRAYS IN EACH NOSTRIL 2-3 times DAILY AS NEEDED 03/10/24   Iva Marty Saltness, MD  ipratropium-albuterol  (DUONEB) 0.5-2.5 (3) MG/3ML SOLN Take 3 mLs by nebulization every 6 (six) hours as needed (Shortness of breath, cough, wheeze or chest tightness). 06/30/24   Jeneal Danita Macintosh, MD  isosorbide  mononitrate (IMDUR ) 30 MG 24 hr tablet Take 0.5 tablets (15 mg total) by mouth at bedtime. Patient taking  differently: Take 30 mg by mouth at bedtime. Taking 1 tablet by mouth daily 09/08/24 12/07/24  Fairy Frames, MD  lactulose  (CHRONULAC ) 10 GM/15ML solution Take 45 mLs (30 g total) by mouth daily as needed. Take 15-30 ml up to twice daily for constipation 01/08/23   Pyrtle, Gordy HERO, MD  levalbuterol  (XOPENEX  HFA) 45 MCG/ACT inhaler INHALE TWO puffs into THE lungs EVERY FOUR HOURS AS NEEDED wheezing 06/03/24   Iva Marty Saltness, MD  levothyroxine  (SYNTHROID ) 25 MCG tablet TAKE ONE TABLET BY MOUTH DAILY AT EIGHT a.m. DAILY BEFORE BREAKFAST 08/31/24   [name], NP   lubiprostone  (AMITIZA ) 24 MCG capsule TAKE ONE CAPSULE BY MOUTH TWICE DAILY WITH a meal 09/29/24   Pyrtle, Gordy HERO, MD  melatonin 3 MG TABS tablet Take 3 mg by mouth at bedtime.    [provider]  methocarbamol  (ROBAXIN ) 500 MG tablet Take 500 mg by mouth 3 (three) times daily as needed for muscle spasms.    [provider]  metoprolol  tartrate (LOPRESSOR ) 25 MG tablet TAKE ONE TABLET BY MOUTH TWICE DAILY Patient taking differently: Take 12.5 mg by mouth 2 (two) times daily. 06/17/24   Wonda Sharper, MD  mycophenolate  (MYFORTIC ) 180 MG EC tablet Take 2 tablets by mouth in the morning, at noon, and at bedtime. 07/14/24   [provider]  Olopatadine  HCl (PATADAY ) 0.7 % SOLN Place 1 drop into both eyes 2 (two) times daily as needed (allergies). 11/13/22   Iva Marty Saltness, MD  pantoprazole  (PROTONIX ) 40 MG tablet Take 1 tablet (40 mg total) by mouth daily. 10/12/24   Iva Marty Saltness, MD  predniSONE  (DELTASONE ) 5 MG tablet Take 5 mg by mouth daily with breakfast. 07/14/24   [provider]  rOPINIRole  (REQUIP ) 1 MG tablet Take 1 tablet (1 mg total) by mouth at bedtime. 10/26/23   Olalere, Jennet A, MD  rosuvastatin  (CRESTOR ) 10 MG tablet Take 1 tablet (10 mg total) by mouth daily. 10/19/23   Wonda Sharper, MD  sodium zirconium cyclosilicate  (LOKELMA ) 5 g packet Take 5 g twice a week, Mondays and Thursdays 09/09/24   Fairy Frames, MD  Spacer/Aero-Holding Raguel DEVI 1 Device by Does not apply route 2 (two) times a day. 03/01/19   Bobbitt, Elgin Pepper, MD  sulfamethoxazole -trimethoprim  (BACTRIM  DS) 800-160 MG tablet Take 1 tablet by mouth every Monday, Wednesday, and Friday. 07/15/24   [provider]  SYMBICORT  160-4.5 MCG/ACT inhaler Inhale 2 puffs into the lungs in the morning and at bedtime. 07/01/23   Iva Marty Saltness, MD  tacrolimus  (PROGRAF ) 1 MG capsule Take 1 mg by mouth daily. 2 tablets by mouth daily Patient taking differently: Take  1 mg by mouth daily. Extended release 2 tablets by mouth daily    [provider]  tacrolimus  ER (ENVARSUS  XR) 4 MG TB24 Take 9 mg by mouth. Take 8mg  ER tablets and 1mg  ER tablet at 0900 for total of 9mg  ER. Patient taking differently: Take 4 mg by mouth. 2 tablets daily 08/01/24 08/01/25  [provider]  valGANciclovir  (VALCYTE ) 450 MG tablet Take 450 mg by mouth every other day. Patient taking differently: Take 450 mg by mouth daily. 09/01/24   [provider]    Allergies: Ace inhibitors and Lisinopril    Review of Systems  Skin:  Positive for rash.  All other systems reviewed and are negative.   Updated Vital Signs BP (!) 106/95   Pulse (!) 56   Temp (!) 100.5 F (38.1 C)  Resp 12   Ht 5' 10 (1.778 m)   Wt 93 kg   SpO2 100%   BMI 29.41 kg/m   Physical Exam Vitals and nursing note reviewed.   Gen: Appears uncomfortable Eyes: PERRL, EOMI HEENT: no oropharyngeal swelling Neck: trachea midline Resp: clear to auscultation bilaterally Card: RRR, no murmurs, rubs, or gallops Abd: nontender, nondistended GU: The patient does have some desquamation noted around the urethral meatus as well is over the testicles and within the gluteal cleft some folds, this is malodorous but I do not appreciate any obvious crepitus at this time, this is extremely tender to palpation with any manipulation Extremities: no calf tenderness, no edema Vascular: 2+ radial pulses bilaterally, 2+ DP pulses bilaterally Skin: no rashes Psyc: acting appropriately   (all labs ordered are listed, but only abnormal results are displayed) Labs Reviewed  COMPREHENSIVE METABOLIC PANEL WITH GFR - Abnormal; Notable for the following components:      Result Value   Potassium 3.4 (*)    CO2 19 (*)    Glucose, Bld 117 (*)    BUN 24 (*)    Creatinine, Ser 1.81 (*)    Total Protein 6.1 (*)    AST 44 (*)    ALT 55 (*)    GFR, Estimated 43 (*)    All other components within normal  limits  CBC WITH DIFFERENTIAL/PLATELET - Abnormal; Notable for the following components:   WBC 1.6 (*)    Platelets 125 (*)    Neutro Abs 1.2 (*)    Lymphs Abs 0.1 (*)    Abs Immature Granulocytes 0.12 (*)    All other components within normal limits  RESP PANEL BY RT-PCR (RSV, FLU A&B, COVID)  RVPGX2  CULTURE, BLOOD (ROUTINE X 2)  CULTURE, BLOOD (ROUTINE X 2)  PROTIME-INR  URINALYSIS, W/ REFLEX TO CULTURE (INFECTION SUSPECTED)  I-STAT CG4 LACTIC ACID, ED  I-STAT CG4 LACTIC ACID, ED    EKG: EKG Interpretation Date/Time:  Sunday October 16 2024 10:27:17 EST Ventricular Rate:  158 PR Interval:  82 QRS Duration:  82 QT Interval:  282 QTC Calculation: 458 R Axis:   94  Text Interpretation: Narrow complex tachycardia Borderline right axis deviation Borderline repolarization abnormality Confirmed by Ula Barter [phone]) on 10/16/2024 10:31:51 AM  Radiology: [facility] Chest Port 1 View Result Date: 10/16/2024 EXAM: 1 VIEW(S) XRAY OF THE CHEST 10/16/2024 11:13:21 AM COMPARISON: 08/31/2024 CLINICAL HISTORY: Questionable sepsis - evaluate for abnormality FINDINGS: LINES, TUBES AND DEVICES: Stimulator overlies right chest with lead extending superiorly. LUNGS AND PLEURA: Mild pulmonary edema, improved since prior study. No pleural effusion. No pneumothorax. HEART AND MEDIASTINUM: Stable cardiomegaly. Mitral valve replacement noted. BONES AND SOFT TISSUES: Right upper quadrant cholecystectomy clips noted. No acute osseous abnormality. IMPRESSION: 1. Mild pulmonary edema, improved since prior study. Electronically signed by: Waddell Calk MD 10/16/2024 11:27 AM EST RP Workstation: [id number]   CT PELVIS WO CONTRAST Result Date: 10/16/2024 EXAM: CT PELVIS, WITHOUT IV CONTRAST 10/16/2024 10:08:43 AM TECHNIQUE: Axial images were acquired through the pelvis without IV contrast. Reformatted images were reviewed. Automated exposure control, iterative reconstruction, and/or weight based adjustment of the  mA/kV was utilized to reduce the radiation dose to as low as reasonably achievable. COMPARISON: 08/02/2024 CLINICAL HISTORY: Necrotizing fasciitis suspected; Eval for nec fasc, hx renal transplant recently treated for rejection. FINDINGS: BONES: Visualized osseous structures are unremarkable. Schmorl's node deformities identified within the lower lumbar spine, unchanged in the interval. No acute or suspicious osseous findings. JOINTS: No dislocation.  The joint spaces are normal. SOFT TISSUES: The superficial soft tissues of the pelvis appear within normal limits. No significant soft tissue stranding, skin thickening, or signs of a fluid collection. No soft tissue gas identified. Small bilateral fat-containing inguinal hernias. INTRAPELVIC CONTENTS: The inferior pole of the atrophic right kidney is again noted with a small hyperdense lesion arising off the measuring 80 hounsfield units and 7 mm. This is compatible with a benign Bosniak class 2 cyst. No follow-up imaging recommended. Within the right iliac fossa, a transplant renal graft is again seen. The indwelling ureteral stent has been removed, and there has been interval improvement in right hydronephrosis, now mild. Mild perinephric soft tissue stranding about the renal graft is again seen, improved from previous exam. The bladder is grossly unremarkable. The visualized bowel loops within the abdomen and pelvis are nondilated without inflammatory changes. There is no free fluid or fluid collections within the imaged portions of the pelvis. Atherosclerotic calcifications involving the abdominal aorta and its branches. No signs of adenopathy. IMPRESSION: 1. No evidence of necrotizing fasciitis in the pelvis. 2. Renal graft in the right iliac fossa with interval improvement in right hydronephrosis, now mild, and mild perinephric soft tissue stranding, improved from previous exam. Electronically signed by: Waddell Calk MD 10/16/2024 10:25 AM EST RP Workstation:  [id number]     Procedures   Medications Ordered in the ED  lactated ringers  infusion ( Intravenous New Bag/Given 10/16/24 1211)  acetaminophen  (OFIRMEV ) IV 1,000 mg (has no administration in time range)  lactated ringers  bolus 1,000 mL (1,000 mLs Intravenous New Bag/Given 10/16/24 1049)    And  lactated ringers  bolus 1,000 mL (1,000 mLs Intravenous New Bag/Given 10/16/24 1209)    And  lactated ringers  bolus 1,000 mL (1,000 mLs Intravenous New Bag/Given 10/16/24 1103)  piperacillin -tazobactam (ZOSYN ) IVPB 3.375 g (3.375 g Intravenous New Bag/Given 10/16/24 1048)  linezolid  (ZYVOX ) IVPB 600 mg (600 mg Intravenous New Bag/Given 10/16/24 1107)  fentaNYL  (SUBLIMAZE ) injection 50 mcg (50 mcg Intravenous Given 10/16/24 1045)                                    Medical Decision Making 58 year old male with past medical history of renal transplant who was recently treated for rejection who is now on Bactrim  daily presenting to the emergency department today with fever and pain in his groin.  Will initiate a sepsis workup on the patient and treat him empirically with vancomycin  and linezolid  for possible necrotizing infection.  Will obtain noncontrast CT scan of his pelvis to further evaluate for necrotizing infection.  Differential also includes Stevens-Johnson syndrome with the patient being on Bactrim  recently.  Will give the patient IV fluids here as well.  Will give him fentanyl  for pain.  After his workup is complete we will discuss his case with you and see where he has received his transplant care.  The patient's EKG does show a narrow complex tachycardia with rate in the 150s.  Medical in the room afterwards patient clearly appears to be in atrial fibrillation and his rate is in the low 100s.  Will give patient IV fluids here and antipyretics and see if this helps with his heart rate.  The patient's heart rate did improve with the IV fluids and is now in the high 90s to low 100s.  Will hold  off on rate control or medication here.  The patient CT scan did not show  any findings consistent with necrotizing infection.  Given his fever and concern for possible Stevens-Johnson syndrome I did call and discussed this case with Dr. Kennie from nephrology at [facility].  He does accept the patient in transfer to their medicine service.  Plan is for transfer for further evaluation and management.  CRITICAL CARE Performed by: Prentice JONELLE Medicus   Total critical care time: 35 minutes  Critical care time was exclusive of separately billable procedures and treating other patients.  Critical care was necessary to treat or prevent imminent or life-threatening deterioration.  Critical care was time spent personally by me on the following activities: development of treatment plan with patient and/or surrogate as well as nursing, discussions with consultants, evaluation of patient's response to treatment, examination of patient, obtaining history from patient or surrogate, ordering and performing treatments and interventions, ordering and review of laboratory studies, ordering and review of radiographic studies, pulse oximetry and re-evaluation of patient's condition.   Amount and/or Complexity of Data Reviewed Labs: ordered. Radiology: ordered.  Risk Prescription drug management.        Final diagnoses:  Fever, unspecified fever cause  Atrial fibrillation with RVR [facility])  Groin rash    ED Discharge Orders     None          [employer] Prentice JONELLE, MD 10/16/24 1308  "

## 2024-10-16 NOTE — ED Notes (Signed)
 Rerpot given thw hu

## 2024-10-17 ENCOUNTER — Telehealth: Payer: Self-pay

## 2024-10-17 NOTE — Transitions of Care (Post Inpatient/ED Visit) (Signed)
" ° °  10/17/2024  Name: Albert Eaton MRN: [id number] DOB: [birth year]  Today's TOC FU Call Status: Today's TOC FU Call Status:: Unsuccessful Call (1st Attempt) Unsuccessful Call (1st Attempt) Date: 10/17/24  Attempted to reach the patient regarding the most recent Inpatient/ED visit.  Follow Up Plan: Additional outreach attempts will be made to reach the patient to complete the Transitions of Care (Post Inpatient/ED visit) call.   Arvin Seip RN, BSN, CCM [employer], Population Health Case Manager Phone: [phone]  "

## 2024-10-18 ENCOUNTER — Telehealth: Payer: Self-pay

## 2024-10-18 NOTE — Transitions of Care (Post Inpatient/ED Visit) (Signed)
" ° °  10/18/2024  Name: Albert Eaton Better MRN: [id number] DOB: [birth year]  Today's TOC FU Call Status: Today's TOC FU Call Status:: Unsuccessful Call (2nd Attempt) Unsuccessful Call (2nd Attempt) Date: 10/18/24  Attempted to reach the patient regarding the most recent Inpatient/ED visit.  Follow Up Plan: Additional outreach attempts will be made to reach the patient to complete the Transitions of Care (Post Inpatient/ED visit) call.   Arvin Seip RN, BSN, CCM [employer], Population Health Case Manager Phone: [phone]  "

## 2024-10-21 LAB — CULTURE, BLOOD (ROUTINE X 2)
Culture: NO GROWTH
Culture: NO GROWTH
Special Requests: ADEQUATE
Special Requests: ADEQUATE

## 2024-10-24 ENCOUNTER — Telehealth: Payer: Self-pay | Admitting: *Deleted

## 2024-10-24 NOTE — Transitions of Care (Post Inpatient/ED Visit) (Signed)
 "  10/24/2024  Name: Albert Eaton MRN: [id number] DOB: 22-Jul-1966  Today's TOC FU Call Status: Today's TOC FU Call Status:: Successful TOC FU Call Completed TOC FU Call Complete Date: 10/24/24  Patient's Name and Date of Birth confirmed. Name, DOB  Transition Care Management Follow-up Telephone Call Discharge Facility: Other ([facility] Facility) Name of Other ([facility]) Discharge Facility: [facility] Type of Discharge: Inpatient Admission Primary Inpatient Discharge Diagnosis:: Scrotal swelling How have you been since you were released from the hospital?: Better Any questions or concerns?: No  Items Reviewed: Did you receive and understand the discharge instructions provided?: Yes Medications obtained,verified, and reconciled?: Partial Review Completed Reason for Partial Mediation Review: Patient declined a completed medication review Any new allergies since your discharge?: No Dietary orders reviewed?: NA Do you have support at home?: Yes People in Home [RPT]: alone Name of Support/Comfort Primary Source: Patient has family members that live close by and are supportive  Medications Reviewed Today: Patient declined complete Medication review Medications Reviewed Today     Reviewed by Lucky Andrea LABOR, RN (Registered Nurse) on 10/24/24 at 1053  Med List Status: <None>   Medication Order Taking? Sig Documenting Provider Last Dose Status Informant  acetaminophen  (TYLENOL ) 500 MG tablet [id number]  Take 500 mg by mouth every 6 (six) hours as needed for moderate pain (pain score 4-6). [provider]  Active   allopurinol  (ZYLOPRIM ) 100 MG tablet [id number]  TAKE ONE TABLET BY MOUTH EVERY DAY [name], NP  Active Self, Pharmacy Records  amLODipine  (NORVASC ) 2.5 MG tablet [id number]  Take 1 tablet (2.5 mg total) by mouth daily at 8 pm. Wonda Sharper, MD  Active Self, Pharmacy Records  apixaban  (ELIQUIS ) 5 MG TABS tablet [id number]  Take 1 tablet (5 mg total) by mouth 2  (two) times daily. Fairy Frames, MD  Active   aspirin  EC 81 MG tablet [id number]  Take 81 mg by mouth daily. Swallow whole. [provider]  Active Self, Pharmacy Records  azelastine  (ASTELIN ) 0.1 % nasal spray [id number]  place TWO SPRAY IN EACH NOSTRIL TWICE DAILY Iva Marty Saltness, MD  Active Self, Pharmacy Records  bictegravir-emtricitabine -tenofovir  AF (BIKTARVY ) 50-200-25 MG TABS tablet [id number]  Take 1 tablet by mouth daily. Dennise Kingsley, MD  Active Self, Pharmacy Records  cetirizine  (ZYRTEC ) 10 MG tablet [id number]  TAKE ONE TABLET BY MOUTH EVERY DAY AT 9.00 IN THE MORNING Iva Marty Saltness, MD  Active Self, Pharmacy Records  cinacalcet  (SENSIPAR ) 30 MG tablet [id number]  Take 30 mg by mouth daily. [provider]  Active   ezetimibe  (ZETIA ) 10 MG tablet [id number]  Take 10 mg by mouth daily. [provider]  Active Self, Pharmacy Records  famotidine  (PEPCID ) 20 MG tablet [id number]  TAKE ONE TABLET BY MOUTH TWICE DAILY @ 9 a.m.- FIVE p.m. Iva Marty Saltness, MD  Active   fluticasone  (FLOVENT  [facility]) 110 MCG/ACT inhaler [id number]  Inhale 2 puffs into the lungs 2 (two) times daily as needed (for flares). [provider]  Active Self, Pharmacy Records  furosemide  (LASIX ) 40 MG tablet [id number] Yes Take 40 mg by mouth daily.  Patient taking differently: Take 40 mg by mouth daily as needed.   [provider]  Active Self, Pharmacy Records  gabapentin  (NEURONTIN ) 100 MG capsule [id number]  Take 100 mg by mouth 3 (three) times daily. [provider]  Active Self, Pharmacy Records  ipratropium (ATROVENT ) 0.06 % nasal spray [id number]  instill TWO SPRAYS IN EACH NOSTRIL 2-3 times  DAILY AS NEEDED Iva Marty Saltness, MD  Active Self, Pharmacy Records  ipratropium-albuterol  (DUONEB) 0.5-2.5 (3) MG/3ML SOLN [id number]  Take 3 mLs by nebulization every 6 (six) hours as needed (Shortness of breath, cough, wheeze or chest tightness). Jeneal Danita Macintosh, MD  Active Self, Pharmacy Records  isosorbide  mononitrate (IMDUR ) 30 MG 24 hr tablet [id number]  Take 0.5 tablets (15 mg total) by mouth at bedtime.  Patient taking differently: Take 30 mg by mouth at bedtime. Taking 1 tablet by mouth daily   Fairy Frames, MD  Active   lactulose  (CHRONULAC ) 10 GM/15ML solution [id number]  Take 45 mLs (30 g total) by mouth daily as needed. Take 15-30 ml up to twice daily for constipation Pyrtle, Gordy HERO, MD  Active Self, Pharmacy Records  levalbuterol  (XOPENEX  HFA) 45 MCG/ACT inhaler [id number]  INHALE TWO puffs into THE lungs EVERY FOUR HOURS AS NEEDED wheezing Iva Marty Saltness, MD  Active Self, Pharmacy Records  levofloxacin (LEVAQUIN) 500 MG tablet [id number] Yes Take 500 mg by mouth daily. [provider]  Active   levothyroxine  (SYNTHROID ) 25 MCG tablet [id number]  TAKE ONE TABLET BY MOUTH DAILY AT EIGHT a.m. DAILY BEFORE BREAKFAST [name], NP  Active Self, Pharmacy Records  lubiprostone  (AMITIZA ) 24 MCG capsule [id number]  TAKE ONE CAPSULE BY MOUTH TWICE DAILY WITH a meal Pyrtle, Gordy HERO, MD  Active   melatonin 3 MG TABS tablet [id number]  Take 3 mg by mouth at bedtime. [provider]  Active Self, Pharmacy Records  methocarbamol  (ROBAXIN ) 500 MG tablet [id number]  Take 500 mg by mouth 3 (three) times daily as needed for muscle spasms. [provider]  Active   metoprolol  tartrate (LOPRESSOR ) 25 MG tablet [id number]  TAKE ONE TABLET BY MOUTH TWICE DAILY  Patient taking differently: Take 12.5 mg by mouth 2 (two) times daily.   Wonda Sharper, MD  Active Self, Pharmacy Records  mycophenolate  (MYFORTIC ) 180 MG EC tablet [id number]  Take 2 tablets by mouth in the morning, at noon, and at bedtime. [provider]  Active Self, Pharmacy Records  Olopatadine  HCl (PATADAY ) 0.7 % SOLN [id number]  Place 1 drop into both eyes 2 (two) times daily as needed (allergies). Iva Marty Saltness, MD  Active Self, Pharmacy  Records  pantoprazole  (PROTONIX ) 40 MG tablet [id number]  Take 1 tablet (40 mg total) by mouth daily. Iva Marty Saltness, MD  Active   predniSONE  (DELTASONE ) 10 MG tablet [id number]  Take by mouth. [provider]  Active   predniSONE  (DELTASONE ) 5 MG tablet [id number]  Take 5 mg by mouth daily with breakfast. [provider]  Active Self, Pharmacy Records  rOPINIRole  (REQUIP ) 1 MG tablet [id number]  Take 1 tablet (1 mg total) by mouth at bedtime. Neda Jennet LABOR, MD  Active Self, Pharmacy Records  rosuvastatin  (CRESTOR ) 10 MG tablet [id number]  Take 1 tablet (10 mg total) by mouth daily. Wonda Sharper, MD  Active Self, Pharmacy Records  sodium zirconium cyclosilicate  (LOKELMA ) 5 g packet [id number]  Take 5 g twice a week, Mondays and Thursdays Fairy Frames, MD  Active   Spacer/Aero-Holding Chambers DEVI [id number]  1 Device by Does not apply route 2 (two) times a day. Bobbitt, Elgin Pepper, MD  Active Self, Pharmacy Records  sulfamethoxazole -trimethoprim  (BACTRIM  DS) 800-160 MG tablet [id number]  Take 1 tablet by mouth every Monday, Wednesday, and Friday. [provider]  Active Self, Pharmacy Records  SYMBICORT  160-4.5 MCG/ACT inhaler [id number]  Inhale 2 puffs into the lungs in the  morning and at bedtime. Iva Marty Saltness, MD  Active Self, Pharmacy Records  tacrolimus  (PROGRAF ) 1 MG capsule [id number]  Take 1 mg by mouth daily. 2 tablets by mouth daily  Patient taking differently: Take 1 mg by mouth daily. Extended release 2 tablets by mouth daily   [provider]  Active   tacrolimus  ER (ENVARSUS  XR) 4 MG TB24 [id number]  Take 9 mg by mouth. Take 8mg  ER tablets and 1mg  ER tablet at 0900 for total of 9mg  ER.  Patient taking differently: Take 4 mg by mouth. 2 tablets daily   [provider]  Active Self, Pharmacy Records           Med Note (WHITE, DONETA RAMAN   Thu Sep 01, 2024  4:22 AM) Taking 9 mg daily   tacrolimus  ER (ENVARSUS  XR) 4 MG TB24  [id number]  Take 8 mg by mouth daily before breakfast. [provider]  Active   valGANciclovir  (VALCYTE ) 450 MG tablet [id number]  Take 450 mg by mouth every other day.  Patient taking differently: Take 450 mg by mouth daily.   [provider]  Active   Med List Note Isabel Doneta RAMAN, CPhT 09/01/24 9579): Transplant 9/11            Home Care and Equipment/Supplies: Were Home Health Services Ordered?: NA Any new equipment or medical supplies ordered?: NA  Functional Questionnaire: Do you need assistance with bathing/showering or dressing?: No Do you need assistance with meal preparation?: No Do you need assistance with eating?: No Do you have difficulty maintaining continence: No Do you need assistance with getting out of bed/getting out of a chair/moving?: No Do you have difficulty managing or taking your medications?: No  Follow up appointments reviewed: PCP Follow-up appointment confirmed?: Yes Date of PCP follow-up appointment?: 11/09/24 Follow-up Provider: Dr. Wendee Specialist [facility] Follow-up appointment confirmed?: No Reason Specialist Follow-Up Not Confirmed: Patient has Specialist Provider Number and will Call for Appointment Do you need transportation to your follow-up appointment?: No Do you understand care options if your condition(s) worsen?: Yes-patient verbalized understanding  SDOH Interventions Today    Flowsheet Row Most Recent Value  SDOH Interventions   Food Insecurity Interventions Intervention Not Indicated  Housing Interventions Intervention Not Indicated  Transportation Interventions Intervention Not Indicated  Utilities Interventions Intervention Not Indicated  [Everything is fine right now]    Andrea Dimes RN, BSN [place]  Value-Based Care [facility] RN Care Manager [phone]  "

## 2024-10-29 NOTE — Progress Notes (Unsigned)
" °  Electrophysiology Office Follow up Visit Note:    Date:  10/31/2024   ID:  Albert Eaton, DOB 1965-11-15, MRN [id number]  PCP:  [name], NP  [facility] Cardiologist:  [name], MD  [facility] Electrophysiologist:  [name], MD    Interval History:     Albert Eaton is a 59 y.o. male who presents for a follow up visit.   I last saw him 06/11/2023 for a hx of atrial flutter. He also has a history of ESRD and HIV. At the last appt he was awaiting renal transplant.  Since I last saw the patient he underwent deceased donor kidney transplant in September.  This was complicated by delayed graft function and T-cell mediated rejection.  This was treated with steroid.  He was hospitalized at [facility].  He is doing okay today.  No questions regarding his arrhythmia history.  He has close follow-up arranged with the transplant team.       Past medical, surgical, social and family history were reviewed.  ROS:   Please see the history of present illness.    All other systems reviewed and are negative.  EKGs/Labs/Other Studies Reviewed:    The following studies were reviewed today:     EKG Interpretation Date/Time:  Monday October 31 2024 09:02:36 EST Ventricular Rate:  105 PR Interval:  200 QRS Duration:  92 QT Interval:  366 QTC Calculation: 483 R Axis:   11  Text Interpretation: Sinus tachycardia with Fusion complexes Left ventricular hypertrophy with repolarization abnormality ( R in aVL , Cornell product ) Confirmed by Holts Ole [phone]) on 10/31/2024 9:05:21 AM    Physical Exam:    VS:  BP (!) 150/90 (BP Location: Left Arm, Patient Position: Sitting, Cuff Size: Normal)   Pulse (!) 105   Ht 5' 10 (1.778 m)   Wt 208 lb (94.3 kg)   BMI 29.84 kg/m     Wt Readings from Last 3 Encounters:  10/31/24 208 lb (94.3 kg)  10/16/24 205 lb (93 kg)  09/29/24 210 lb 12.8 oz (95.6 kg)     GEN: no distress CARD: RRR, No MRG RESP: No  IWOB. CTAB.      ASSESSMENT:    1. S/P MVR (mitral valve repair)   2. Atrial flutter, unspecified type (HCC)   3. HFrEF (heart failure with reduced ejection fraction) (HCC)    PLAN:    In order of problems listed above:  #AFL In sinus rhythm today.   On eliquis     #Hypertension At goal today.  Recommend checking blood pressures 1-2 times per week at home and recording the values.  Recommend bringing these recordings to the primary care physician.  #HFrEF #s/p Mvr NYHA II. Warm and dry on exam. Follows with general cardiology  I discussed my upcoming departure from [facility] during today's clinic appointment.  The patient will continue to follow-up with one of my EP partners moving forward.  Follow up 1 year with APP.    Signed, Ole Holts, MD, [facility] [place], [facility] 10/31/2024 9:08 AM    Electrophysiology [facility] "

## 2024-10-31 ENCOUNTER — Encounter: Payer: Self-pay | Admitting: Cardiology

## 2024-10-31 ENCOUNTER — Ambulatory Visit: Admitting: Cardiology

## 2024-10-31 VITALS — BP 150/90 | HR 105 | Ht 70.0 in | Wt 208.0 lb

## 2024-10-31 DIAGNOSIS — Z9889 Other specified postprocedural states: Secondary | ICD-10-CM | POA: Diagnosis not present

## 2024-10-31 DIAGNOSIS — I5020 Unspecified systolic (congestive) heart failure: Secondary | ICD-10-CM | POA: Diagnosis not present

## 2024-10-31 DIAGNOSIS — I4892 Unspecified atrial flutter: Secondary | ICD-10-CM | POA: Diagnosis not present

## 2024-10-31 NOTE — Patient Instructions (Signed)
 Medication Instructions:  Your physician recommends that you continue on your current medications as directed. Please refer to the Current Medication list given to you today.  *If you need a refill on your cardiac medications before your next appointment, please call your pharmacy*   Follow-Up: At [facility], you and your health needs are our priority.  As part of our continuing mission to provide you with exceptional heart care, our providers are all part of one team.  This team includes your primary Cardiologist (physician) and Advanced Practice Providers or APPs (Physician Assistants and Nurse Practitioners) who all work together to provide you with the care you need, when you need it.  Your next appointment:   1 year  Provider:   You will see one of the following Advanced Practice Providers on your designated Care Team:   Charlies Arthur, [place] Ozell Jodie Passey, PA-C Suzann Riddle, NP Daphne Barrack, NP Artist Pouch, PA-C

## 2024-11-03 ENCOUNTER — Other Ambulatory Visit (HOSPITAL_COMMUNITY): Payer: Self-pay

## 2024-11-04 ENCOUNTER — Telehealth: Payer: Self-pay | Admitting: Pulmonary Disease

## 2024-11-04 NOTE — Telephone Encounter (Signed)
 Left voicemail for patient to call back to schedule follow up before titration study on 12/05/2024 and a follow up after titration study.

## 2024-11-07 ENCOUNTER — Other Ambulatory Visit: Payer: Self-pay | Admitting: Cardiovascular Disease

## 2024-11-09 ENCOUNTER — Other Ambulatory Visit: Payer: Self-pay | Admitting: Allergy & Immunology

## 2024-11-09 ENCOUNTER — Other Ambulatory Visit: Payer: Self-pay | Admitting: Nurse Practitioner

## 2024-11-09 ENCOUNTER — Ambulatory Visit (INDEPENDENT_AMBULATORY_CARE_PROVIDER_SITE_OTHER): Admitting: Nurse Practitioner

## 2024-11-09 VITALS — BP 142/80 | HR 65 | Temp 98.1°F | Ht 70.0 in | Wt 212.8 lb

## 2024-11-09 DIAGNOSIS — Z09 Encounter for follow-up examination after completed treatment for conditions other than malignant neoplasm: Secondary | ICD-10-CM | POA: Diagnosis not present

## 2024-11-09 NOTE — Patient Instructions (Signed)
 Nice to see you today  I want to see you when you are scheduled, sooner if you need me

## 2024-11-09 NOTE — Progress Notes (Signed)
 "  Established Patient Office Visit  Subjective   Patient ID: Albert Eaton, male    DOB: 1966-01-24  Age: 59 y.o. MRN: [id number]  Chief Complaint  Patient presents with   Hospitalization Follow-up    Pt complains of doing okay. States of having some extra fluid and is breathing heavy and pt complains of flaky hands and feet.     HPI  Patient went to the emergency department on 10/16/2024 with pain in his groin.  He admits to having some pain in the penis and scrotum as well as the rectum and anus..  Patient was admitted and transferred to [facility] for the fear of possible Elspeth Louder syndrome.  Patient was discharged on 10/23/2024 from [facility] health.  Skin rash was severe intertrigo which is improved he was treated with a 5-day course of 5 myoglobulin.  Patient had a punch biopsy performed on 10/17/2024 that showed good severe intertrigo versus possible nutrient deficiency he started on triamcinolone  ointment and ketoconazole cream which provided great improvement.  Patient is here for follow-up.  Patient was started on linezolid , aztreonam and Flagyl .  This started on azithromycin  5 mg daily and IV acyclovir.  On 1224 he was switched to levofloxacin 500 mg daily for a 10-day course for epididymitis coverage.SABRA  He supposed to follow-up with infectious clinic on 12/01/2024 he has a follow-up with nephrology on 11/04/2024.  He can use his Lasix  as needed  Discussed the use of AI scribe software for clinical note transcription with the patient, who gave verbal consent to proceed.  History of Present Illness Albert Eaton is a 59 year old male with a history of kidney transplant who presents with concerns about skin peeling and recent hospitalization for epididymitis.  He was hospitalized on December 21st due to lower abdominal pain that radiated around his body. A CT scan was performed, which appeared normal. He was transferred to [facility] for further evaluation and discharged on December 28th after a  skin biopsy suggested a yeast infection. During his hospital stay, he was treated with various antibiotics and antivirals.  Since discharge, he has noticed peeling of his hands and feet, which began with small bubbles. He visited a dermatologist on January 9th and was prescribed a cream, which he has not yet started using due to a delay in picking it up from the pharmacy. The dermatologist suggested the peeling could be due to dryness, a vitamin deficiency, or post-inflammatory changes.  He has a history of kidney transplant and is currently off dialysis. He was previously treated with antibiotics and myoglobulin for a scrotal issue. His white blood cell count is low. He is immunocompromised. He is scheduled to follow up with infectious disease and nephrology. Recent labs showed stable blood counts, slightly low magnesium , and a positive BK virus test. He is on multiple medications, including tacrolimus  (Envarsus ), Lasix , and others for his kidney condition.  His blood pressure was slightly elevated today but has improved since his last visit on January 5th. He has been using Lasix  as needed for fluid retention. He is also on a regimen of medications including mycophenolate , metoprolol , levothyroxine , and others. He recently completed a course of Levaquin and is not currently taking Bactrim  due to a long-lasting hospital treatment. He is unsure about his current use of Lokelma  for potassium management.       Review of Systems  Constitutional:  Negative for chills and fever.  Respiratory:  Negative for shortness of breath.   Cardiovascular:  Negative for chest pain.  Skin:  Positive for rash.  Neurological:  Negative for headaches.  Psychiatric/Behavioral:  Negative for hallucinations and suicidal ideas.       Objective:     BP (!) 142/80   Pulse 65   Temp 98.1 F (36.7 C) (Oral)   Ht 5' 10 (1.778 m)   Wt 212 lb 12.8 oz (96.5 kg)   SpO2 98%   BMI 30.53 kg/m  BP Readings from Last 3  Encounters:  11/09/24 (!) 142/80  10/31/24 (!) 150/90  10/16/24 131/81   Wt Readings from Last 3 Encounters:  11/09/24 212 lb 12.8 oz (96.5 kg)  10/31/24 208 lb (94.3 kg)  10/16/24 205 lb (93 kg)   SpO2 Readings from Last 3 Encounters:  11/09/24 98%  10/16/24 96%  09/21/24 98%      Physical Exam Vitals and nursing note reviewed.  Constitutional:      Appearance: Normal appearance.  Cardiovascular:     Rate and Rhythm: Normal rate. Rhythm irregular.     Heart sounds: Normal heart sounds.  Pulmonary:     Effort: Pulmonary effort is normal.     Breath sounds: Normal breath sounds.  Abdominal:     General: Bowel sounds are normal. There is distension.  Neurological:     Mental Status: He is alert.      No results found for any visits on 11/09/24.    The ASCVD Risk score (Arnett DK, et al., 2019) failed to calculate for the following reasons:   Risk score cannot be calculated because patient has a medical history suggesting prior/existing ASCVD   * - Cholesterol units were assumed    Assessment & Plan:   Problem List Items Addressed This Visit       Other   Hospital discharge follow-up - Primary   Assessment and Plan Assessment & Plan Acute kidney transplant rejection Recent hospitalization for suspected rejection. Off dialysis with adequate function. Low WBC count indicates immunocompromise. Positive BK virus test may require immunosuppressive therapy adjustment. - Continue follow-up with nephrology and infectious disease specialists. - Consider increasing tacrolimus  dosage per nephrologist's recommendation.  BK virus infection post-kidney transplant Positive BK virus test indicates increased rejection risk. - Continue following with transplant team and taking medication as prescribed.  Candidiasis of skin Biopsy confirmed yeast infection. - Continue follow-up with infectious disease specialist on February 5th. - Continue to follow with dermatology as  recommended -Did review inpatient and ED notes along with most recent blood work from hospitalization.  Did review imaging also  Dermatitis of hands and feet Peeling skin possibly due to stress or vitamin deficiency. Prescribed hydrocortisone  and amlactin or urea  cream. - Start prescribed hydrocortisone  cream and amlactin or urea  cream. - Continue to follow with dermatology as recommended  Essential hypertension Blood pressure slightly elevated but improved. - Continue current antihypertensive regimen.  Return if symptoms worsen or fail to improve, for As scheduled for CPE .    Adina Crandall, NP  "

## 2024-11-14 ENCOUNTER — Other Ambulatory Visit: Payer: Self-pay | Admitting: Cardiovascular Disease

## 2024-11-14 ENCOUNTER — Other Ambulatory Visit: Payer: Self-pay | Admitting: Allergy & Immunology

## 2024-11-21 NOTE — Progress Notes (Unsigned)
 " Cardiology Office Note   Date:  11/21/2024  ID:  Albert Eaton, DOB 04-21-1966, MRN [id number] PCP: [name], NP   HeartCare Providers Cardiologist:  [name], MD Electrophysiologist:  [name], MD (Inactive) { Click to update primary MD,subspecialty MD or APP then REFRESH:1}    History of Present Illness Albert Eaton is a 59 y.o. male with a hx of chronic systolic and diastolic heart failure (LVEF 45-50%), atrial flutter/fibrillation not on anticoagulation, moderate CAD, hypertension, history of hypotension with dialysis, pulmonary artery hypertension, ESRD s/p renal transplant 06/2024, OSA with inspire in place, MR s/p MVR in 2023, asthma, hypothyroidism, hepatitis B and HIV.   In 2023 he had a cardiac arrest during dialysis session.  LVEF at that time was 45-50%.  LHC on 08/2022 with 50% stenosis in the proximal to mid LAD, 40% stenosis proximal RCA, 30% stenosis in the second marginal.  Suspect that cardiac arrest was due to a PEA arrest.  Heart monitor worn after discharge showed normal sinus rhythm and A-fib burden was less than 1%.   He has been on Coumadin  for ease of reversibility should he have opportunity for urgent renal transplant.  He underwent renal transplant at [facility] September 2025.   He has chronic dyspnea on exertion.  On 08/05/2024 a nuclear stress test showed no findings of ischemia or infarction.  He was admitted 08/2024 with acute on chronic HFmrEF.  Echo demonstrated LVEF 50-55%, no RWMA, moderate LVH, normal RV function but moderately enlarged, moderately elevated PASP of 47.3 mmHg, severe BAE, mild MR. He was diuresed and underwent RHC that demonstrated severely elevated filling pressures, concern for high-output CHF from large AV fistula.  He underwent AVF ligation.  He diuresed and was discharged on 40 mg p.o. Lasix .  Admitted to [facility] in 09/2024 with fever of unknown origin. Treated with antibiotics. Lasix  was held      Chronic systolic  and diastolic heart failure - LVEF 49-44%, RHC last admission with severely elevated filling pressures - Felt related to high output CHF due to large AV fistula. Underwent AVF ligation in 08/2024  - Volume managed 40 mg p.o. Lasix  -GDMT limited by renal function - Consider transitioning Lopressor  to Toprol  12.5 mg daily     Hypertension - On 2.5 mg amlodipine , beta-blocker, 15 mg Imdur      History of ESRD s/p renal transplant CKD 4 - AVF ligation 09/08/2024     PAF/flutter -Briefly maintained on Coumadin  prior to transplant for ease of reversibility - Now on Eliquis      CAD - Continue aspirin  and statin, amlodipine , Imdur      Hyperlipidemia with LDL less than 35 - Recent creatinine 1.86 - Remains on 10 mg Crestor , agree with this  ROS: ***  Studies Reviewed      *** Risk Assessment/Calculations {Does this patient have ATRIAL FIBRILLATION?:[id number]} No BP recorded.  {Refresh Note OR Click here to enter BP  :1}***       Physical Exam VS:  There were no vitals taken for this visit.       Wt Readings from Last 3 Encounters:  11/09/24 212 lb 12.8 oz (96.5 kg)  10/31/24 208 lb (94.3 kg)  10/16/24 205 lb (93 kg)    GEN: Well nourished, well developed in no acute distress NECK: No JVD; No carotid bruits CARDIAC: ***RRR, no murmurs, rubs, gallops RESPIRATORY:  Clear to auscultation without rales, wheezing or rhonchi  ABDOMEN: Soft, non-tender, non-distended EXTREMITIES:  No edema; No deformity  ASSESSMENT AND PLAN ***    {Are you ordering a CV Procedure (e.g. stress test, cath, DCCV, TEE, etc)?   Press F2        :[id number]}  Dispo: ***  Signed, Rollo FABIENE Louder, PA-C   "

## 2024-11-21 NOTE — Telephone Encounter (Signed)
 Amiodarone  was discontinued by pulmonology on 05/26/24. Will see if Dr. Wonda can clarify if patient needs to be on this or not.

## 2024-11-24 ENCOUNTER — Other Ambulatory Visit: Payer: Self-pay | Admitting: Cardiovascular Disease

## 2024-11-25 NOTE — Telephone Encounter (Signed)
 Left voicemail for patient to call back to schedule follow up before and after titration study and sent mychart message

## 2024-11-30 ENCOUNTER — Ambulatory Visit: Admitting: Cardiology

## 2024-11-30 NOTE — Telephone Encounter (Signed)
 Left voicemail for patient to call back to schedule [facility] appointment

## 2024-12-05 ENCOUNTER — Ambulatory Visit (HOSPITAL_BASED_OUTPATIENT_CLINIC_OR_DEPARTMENT_OTHER): Admitting: Pulmonary Disease

## 2024-12-30 ENCOUNTER — Encounter: Admitting: Nurse Practitioner

## 2025-10-03 ENCOUNTER — Ambulatory Visit
